# Patient Record
Sex: Female | Born: 1940 | Race: Black or African American | Hispanic: No | Marital: Married | Smoking: Never smoker
Health system: Southern US, Community
[De-identification: ages and names within clinical notes are randomized; demographics above are authoritative.]

## PROBLEM LIST (undated history)

## (undated) ENCOUNTER — Emergency Department (HOSPITAL_COMMUNITY): Admission: EM | Payer: Medicare Other | Source: Home / Self Care

## (undated) DIAGNOSIS — R52 Pain, unspecified: Secondary | ICD-10-CM

## (undated) DIAGNOSIS — Z87898 Personal history of other specified conditions: Secondary | ICD-10-CM

## (undated) DIAGNOSIS — K5790 Diverticulosis of intestine, part unspecified, without perforation or abscess without bleeding: Secondary | ICD-10-CM

## (undated) DIAGNOSIS — Z86010 Personal history of colonic polyps: Secondary | ICD-10-CM

## (undated) DIAGNOSIS — M1990 Unspecified osteoarthritis, unspecified site: Secondary | ICD-10-CM

## (undated) DIAGNOSIS — D126 Benign neoplasm of colon, unspecified: Secondary | ICD-10-CM

## (undated) DIAGNOSIS — E669 Obesity, unspecified: Secondary | ICD-10-CM

## (undated) DIAGNOSIS — K5792 Diverticulitis of intestine, part unspecified, without perforation or abscess without bleeding: Secondary | ICD-10-CM

## (undated) DIAGNOSIS — Z8719 Personal history of other diseases of the digestive system: Secondary | ICD-10-CM

## (undated) DIAGNOSIS — R202 Paresthesia of skin: Secondary | ICD-10-CM

## (undated) DIAGNOSIS — E119 Type 2 diabetes mellitus without complications: Secondary | ICD-10-CM

## (undated) DIAGNOSIS — M810 Age-related osteoporosis without current pathological fracture: Secondary | ICD-10-CM

## (undated) DIAGNOSIS — R200 Anesthesia of skin: Secondary | ICD-10-CM

## (undated) DIAGNOSIS — A498 Other bacterial infections of unspecified site: Secondary | ICD-10-CM

## (undated) DIAGNOSIS — R2990 Unspecified symptoms and signs involving the nervous system: Secondary | ICD-10-CM

## (undated) DIAGNOSIS — I10 Essential (primary) hypertension: Secondary | ICD-10-CM

## (undated) DIAGNOSIS — Z8619 Personal history of other infectious and parasitic diseases: Secondary | ICD-10-CM

## (undated) DIAGNOSIS — E785 Hyperlipidemia, unspecified: Secondary | ICD-10-CM

## (undated) DIAGNOSIS — J189 Pneumonia, unspecified organism: Secondary | ICD-10-CM

## (undated) DIAGNOSIS — G5600 Carpal tunnel syndrome, unspecified upper limb: Secondary | ICD-10-CM

## (undated) DIAGNOSIS — E559 Vitamin D deficiency, unspecified: Secondary | ICD-10-CM

## (undated) DIAGNOSIS — K219 Gastro-esophageal reflux disease without esophagitis: Secondary | ICD-10-CM

## (undated) DIAGNOSIS — I639 Cerebral infarction, unspecified: Secondary | ICD-10-CM

## (undated) DIAGNOSIS — T4145XA Adverse effect of unspecified anesthetic, initial encounter: Secondary | ICD-10-CM

## (undated) DIAGNOSIS — N390 Urinary tract infection, site not specified: Secondary | ICD-10-CM

## (undated) DIAGNOSIS — T8859XA Other complications of anesthesia, initial encounter: Secondary | ICD-10-CM

## (undated) HISTORY — PX: OOPHORECTOMY: SHX86

## (undated) HISTORY — DX: Obesity, unspecified: E66.9

## (undated) HISTORY — PX: OTHER SURGICAL HISTORY: SHX169

## (undated) HISTORY — DX: Carpal tunnel syndrome, unspecified upper limb: G56.00

## (undated) HISTORY — DX: Essential (primary) hypertension: I10

## (undated) HISTORY — DX: Benign neoplasm of colon, unspecified: D12.6

## (undated) HISTORY — DX: Personal history of colonic polyps: Z86.010

## (undated) HISTORY — DX: Diverticulosis of intestine, part unspecified, without perforation or abscess without bleeding: K57.90

## (undated) HISTORY — PX: APPENDECTOMY: SHX54

## (undated) HISTORY — DX: Diverticulitis of intestine, part unspecified, without perforation or abscess without bleeding: K57.92

## (undated) HISTORY — DX: Hyperlipidemia, unspecified: E78.5

## (undated) HISTORY — PX: COLON SURGERY: SHX602

## (undated) HISTORY — DX: Unspecified symptoms and signs involving the nervous system: R29.90

## (undated) HISTORY — PX: COLONOSCOPY: SHX174

## (undated) HISTORY — DX: Vitamin D deficiency, unspecified: E55.9

## (undated) HISTORY — PX: BREAST BIOPSY: SHX20

## (undated) HISTORY — PX: BREAST SURGERY: SHX581

## (undated) HISTORY — DX: Other bacterial infections of unspecified site: A49.8

## (undated) HISTORY — DX: Unspecified osteoarthritis, unspecified site: M19.90

## (undated) HISTORY — PX: POLYPECTOMY: SHX149

## (undated) HISTORY — PX: JOINT REPLACEMENT: SHX530

## (undated) HISTORY — PX: ABDOMINAL HYSTERECTOMY: SHX81

---

## 1999-03-19 ENCOUNTER — Emergency Department (HOSPITAL_COMMUNITY): Admission: EM | Admit: 1999-03-19 | Discharge: 1999-03-19 | Payer: Self-pay | Admitting: Emergency Medicine

## 1999-03-19 ENCOUNTER — Encounter: Payer: Self-pay | Admitting: Emergency Medicine

## 2000-01-22 ENCOUNTER — Encounter: Payer: Self-pay | Admitting: Emergency Medicine

## 2000-01-22 ENCOUNTER — Inpatient Hospital Stay (HOSPITAL_COMMUNITY): Admission: EM | Admit: 2000-01-22 | Discharge: 2000-01-25 | Payer: Self-pay | Admitting: Emergency Medicine

## 2000-01-24 ENCOUNTER — Encounter: Payer: Self-pay | Admitting: Internal Medicine

## 2000-07-11 ENCOUNTER — Other Ambulatory Visit: Admission: RE | Admit: 2000-07-11 | Discharge: 2000-07-11 | Payer: Self-pay | Admitting: Internal Medicine

## 2001-11-28 ENCOUNTER — Encounter: Payer: Self-pay | Admitting: Internal Medicine

## 2001-11-28 ENCOUNTER — Ambulatory Visit (HOSPITAL_COMMUNITY): Admission: RE | Admit: 2001-11-28 | Discharge: 2001-11-28 | Payer: Self-pay | Admitting: Internal Medicine

## 2003-04-27 ENCOUNTER — Emergency Department (HOSPITAL_COMMUNITY): Admission: EM | Admit: 2003-04-27 | Discharge: 2003-04-28 | Payer: Self-pay | Admitting: Emergency Medicine

## 2003-10-31 DIAGNOSIS — J189 Pneumonia, unspecified organism: Secondary | ICD-10-CM

## 2003-10-31 HISTORY — DX: Pneumonia, unspecified organism: J18.9

## 2004-06-08 ENCOUNTER — Encounter: Payer: Self-pay | Admitting: Gastroenterology

## 2004-12-06 ENCOUNTER — Emergency Department (HOSPITAL_COMMUNITY): Admission: EM | Admit: 2004-12-06 | Discharge: 2004-12-06 | Payer: Self-pay | Admitting: Family Medicine

## 2004-12-07 ENCOUNTER — Ambulatory Visit: Payer: Self-pay | Admitting: Internal Medicine

## 2004-12-14 ENCOUNTER — Other Ambulatory Visit: Admission: RE | Admit: 2004-12-14 | Discharge: 2004-12-14 | Payer: Self-pay | Admitting: Obstetrics and Gynecology

## 2005-01-02 ENCOUNTER — Ambulatory Visit (HOSPITAL_COMMUNITY): Admission: RE | Admit: 2005-01-02 | Discharge: 2005-01-02 | Payer: Self-pay | Admitting: Obstetrics and Gynecology

## 2005-01-02 IMAGING — CT CT ABDOMEN W/ CM
1 of 4 series · 14 of 32 positions shown, 19 images · IV contrast (omnipaque)
Comparison: None.

CLINICAL DATA: Pelvic mass.  Right hip pain.  Weight loss.  Constipation.  Hematuria.
ABDOMEN AND PELVIS CT SCAN WITH CONTRAST ? [DATE]:
TECHNIQUE: Contiguous axial CT images were taken through the abdomen and pelvis after the administration of 150 cc of Omnipaque 300 contrast material

[Series 3: abd_pel_xxl 5.0 b10s st · axial · 0.93mm/px · z∈[+892,+1338]mm · 14 of 101 slices shown, 19 images]
[im 6/101  soft-tissue]
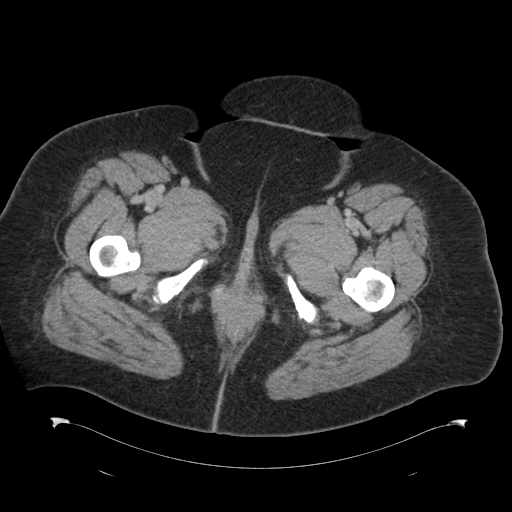
[im 6/101  bone]
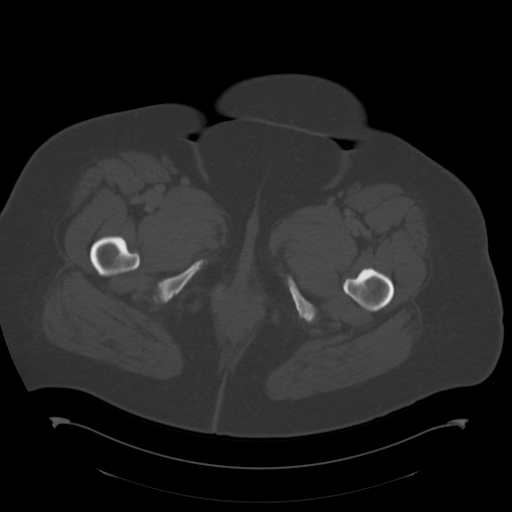
[im 12/101  soft-tissue]
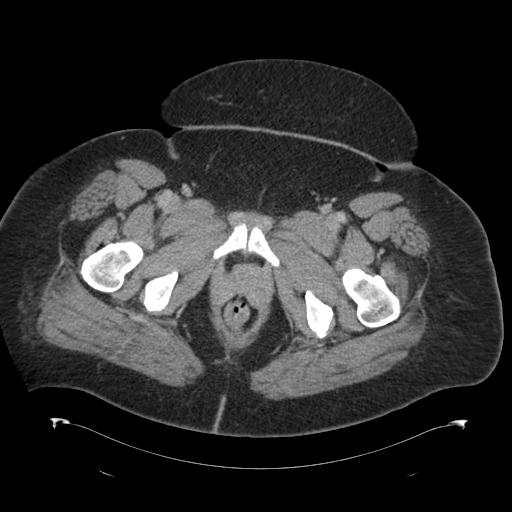
[im 24/101  soft-tissue]
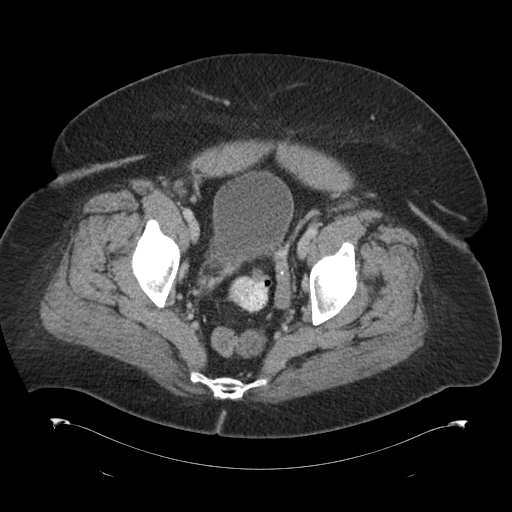
[im 30/101  soft-tissue]
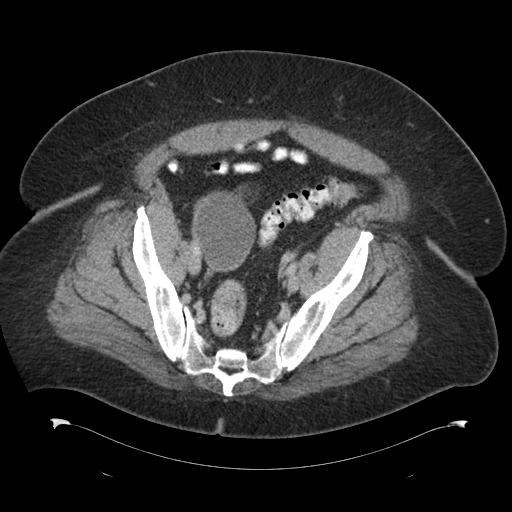
[im 36/101  soft-tissue]
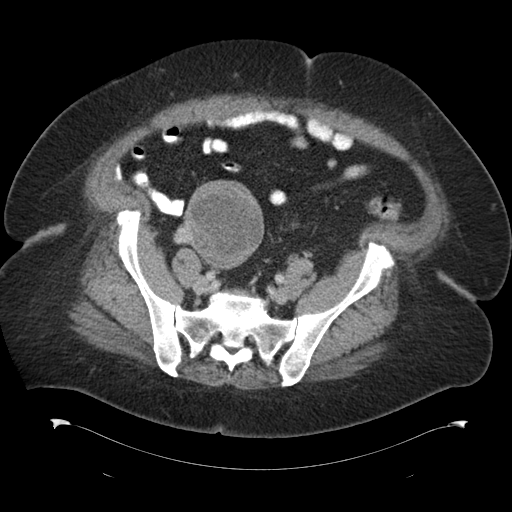
[im 42/101  soft-tissue]
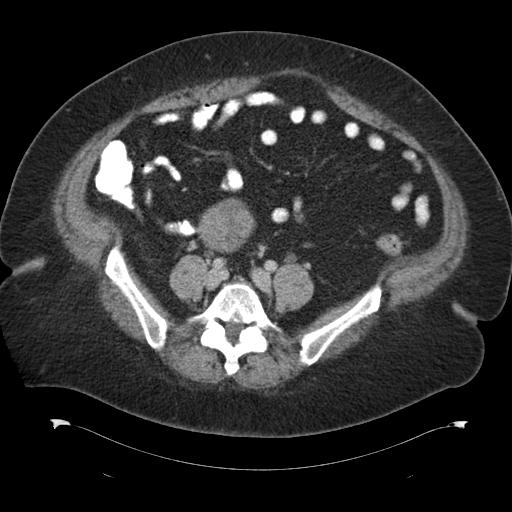
[im 53/101  soft-tissue]
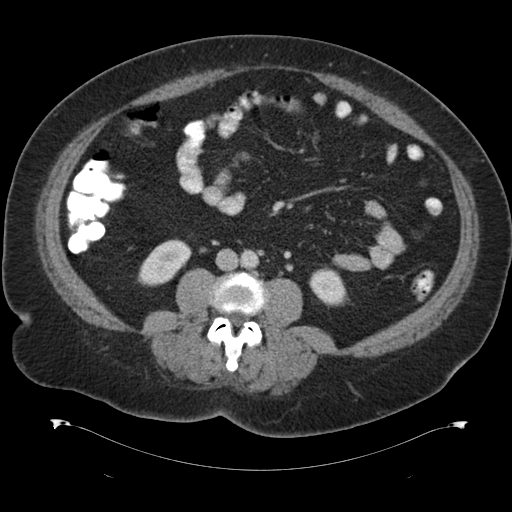
[im 59/101  soft-tissue]
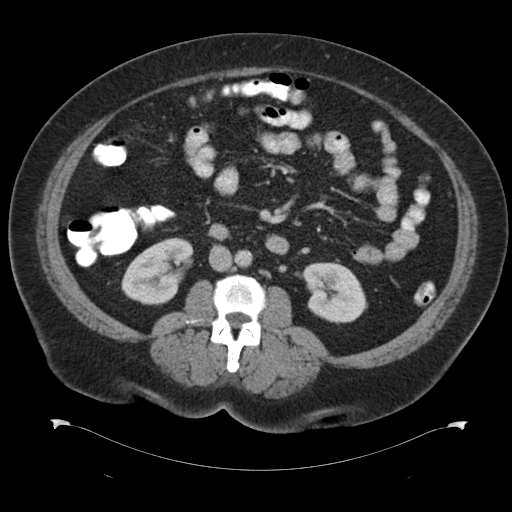
[im 65/101  soft-tissue]
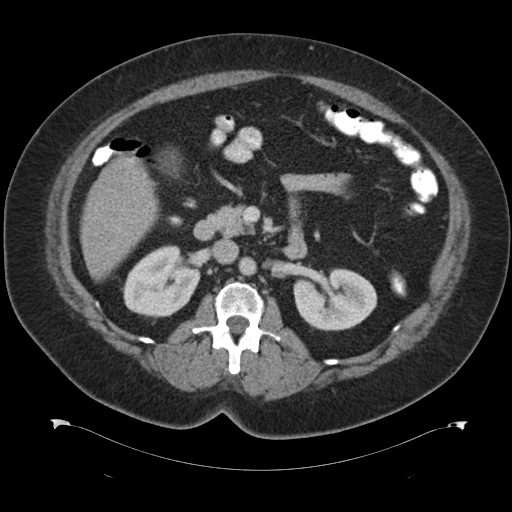
[im 65/101  bone]
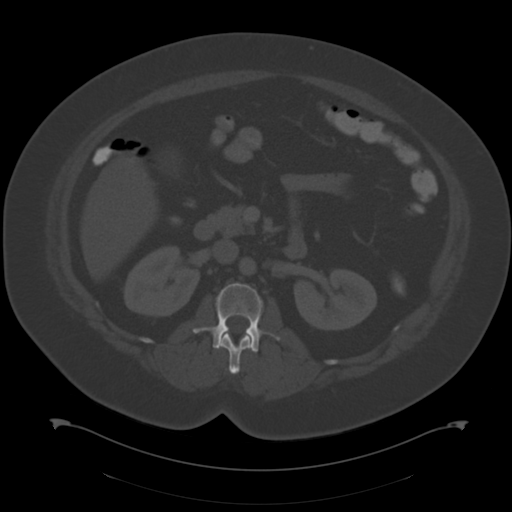
[im 71/101  soft-tissue]
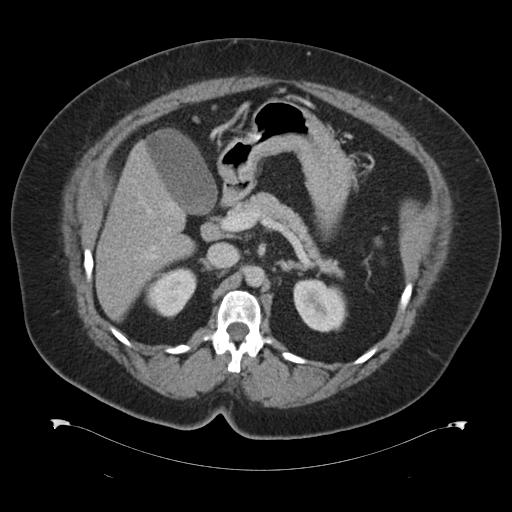
[im 77/101  soft-tissue]
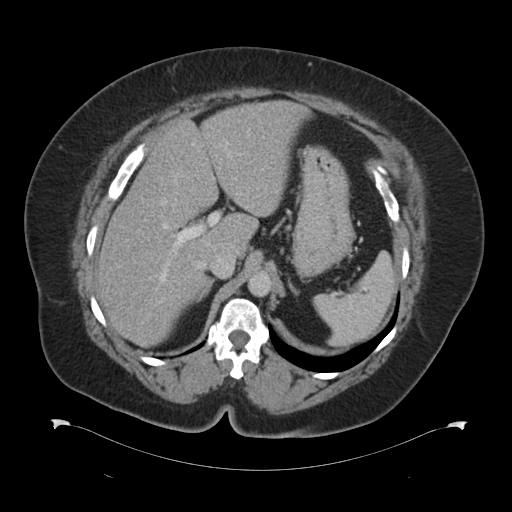
[im 77/101  lung]
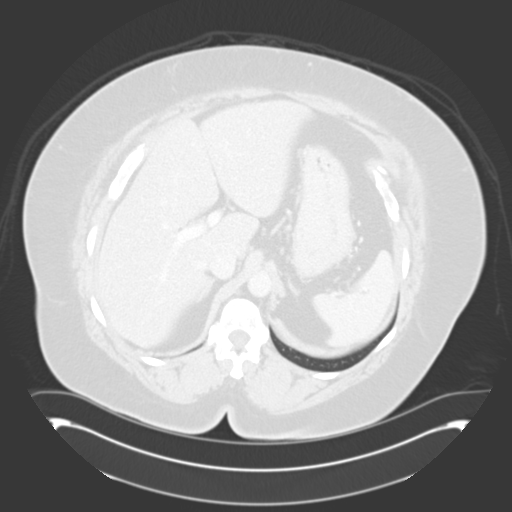
[im 83/101  lung]
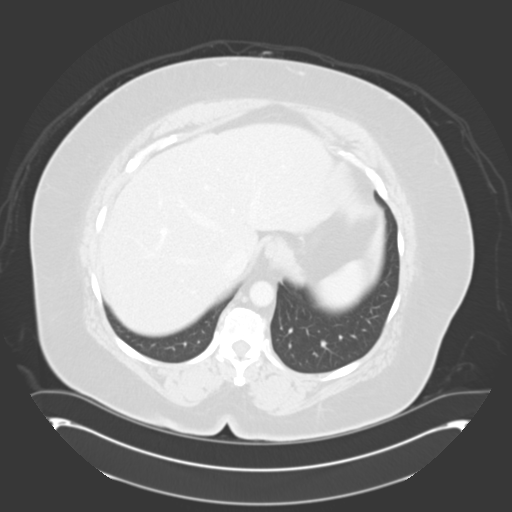
[im 89/101  soft-tissue]
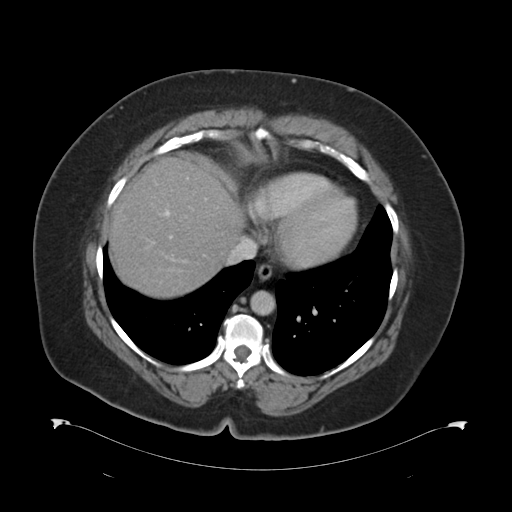
[im 89/101  lung]
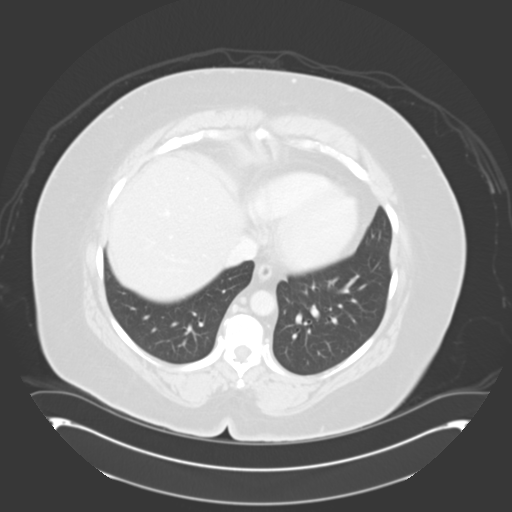
[im 95/101  soft-tissue]
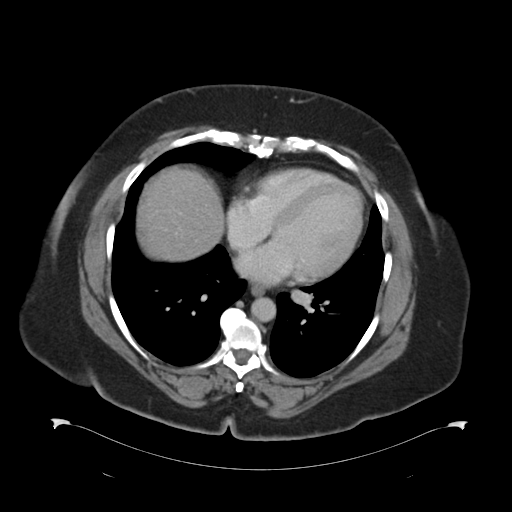
[im 95/101  lung]
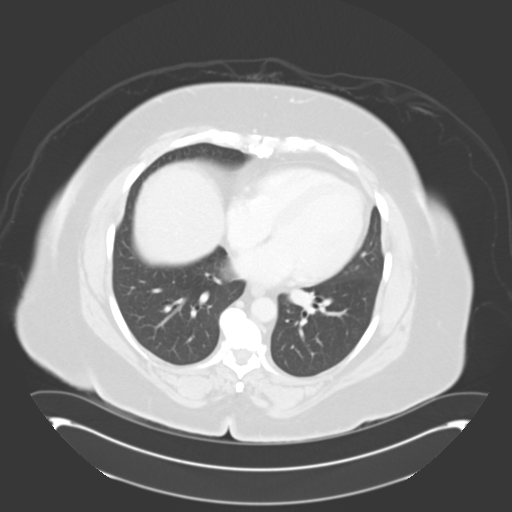

[14 of 32 positions shown; findings below may reference images not displayed]

FINDINGS: ABDOMEN:
The lung bases are clear.  No pleural or pericardial effusion.
The liver, spleen, pancreas, kidneys, and adrenal glands all appear normal.  A 1.6 x 1.9 cm porta hepatis lymph node is identified on image #29 of series 3.  A second porta hepatis node measuring 2.1 x 2.5 cm is identified on image #31.  There appears to be some sludge within the dilated gallbladder.  No abnormal abdominal fluid collections are identified.  The stomach and bowel of the abdomen appear normal.  No focal bony abnormality.
IMPRESSION: 1.  Porta hepatis lymphadenopathy as above.  No other findings to suggest metastatic disease identified.
2.  Biliary sludge.
PELVIS:
There is a heterogenous mass in the right adnexa measuring approximately 8.5 cm AP x 7.3 cm transverse x 9 cm craniocaudal.  No pelvic ascites is identified.  The patient is status post hysterectomy.  The left ovary appears normal.  Lymph nodes are identified in the pelvic sidewalls on the right measuring 2.5 x 0.7 cm on image #79 and on the left measuring 1.8 x 0.8 cm on image #78.  Also seen is a left external iliac node measuring 0.8 x 2.1 cm on image #82.  No pelvic ascites.  Scattered diverticula are seen in the colon without diverticulitis.  The appendix appears normal.
IMPRESSION: 1.  Right adnexal mass with an appearance highly worrisome for neoplasm.
2.  Few small pelvic lymph nodes bilaterally as detailed above.
3.  Diverticulosis without diverticulitis.

## 2005-01-04 ENCOUNTER — Ambulatory Visit: Payer: Self-pay | Admitting: Internal Medicine

## 2005-02-06 ENCOUNTER — Ambulatory Visit: Payer: Self-pay | Admitting: Internal Medicine

## 2005-02-20 ENCOUNTER — Ambulatory Visit: Payer: Self-pay | Admitting: Internal Medicine

## 2005-03-06 ENCOUNTER — Ambulatory Visit: Payer: Self-pay | Admitting: Internal Medicine

## 2005-04-08 ENCOUNTER — Emergency Department (HOSPITAL_COMMUNITY): Admission: EM | Admit: 2005-04-08 | Discharge: 2005-04-08 | Payer: Self-pay | Admitting: *Deleted

## 2005-04-08 IMAGING — CR DG CHEST 2V
1 series · 1 of 1 positions shown · non-contrast
Comparison: None.

CLINICAL DATA: Chest pain and diarrhea.  
 CHEST - 2 VIEW - [DATE]:

[w chest pa]
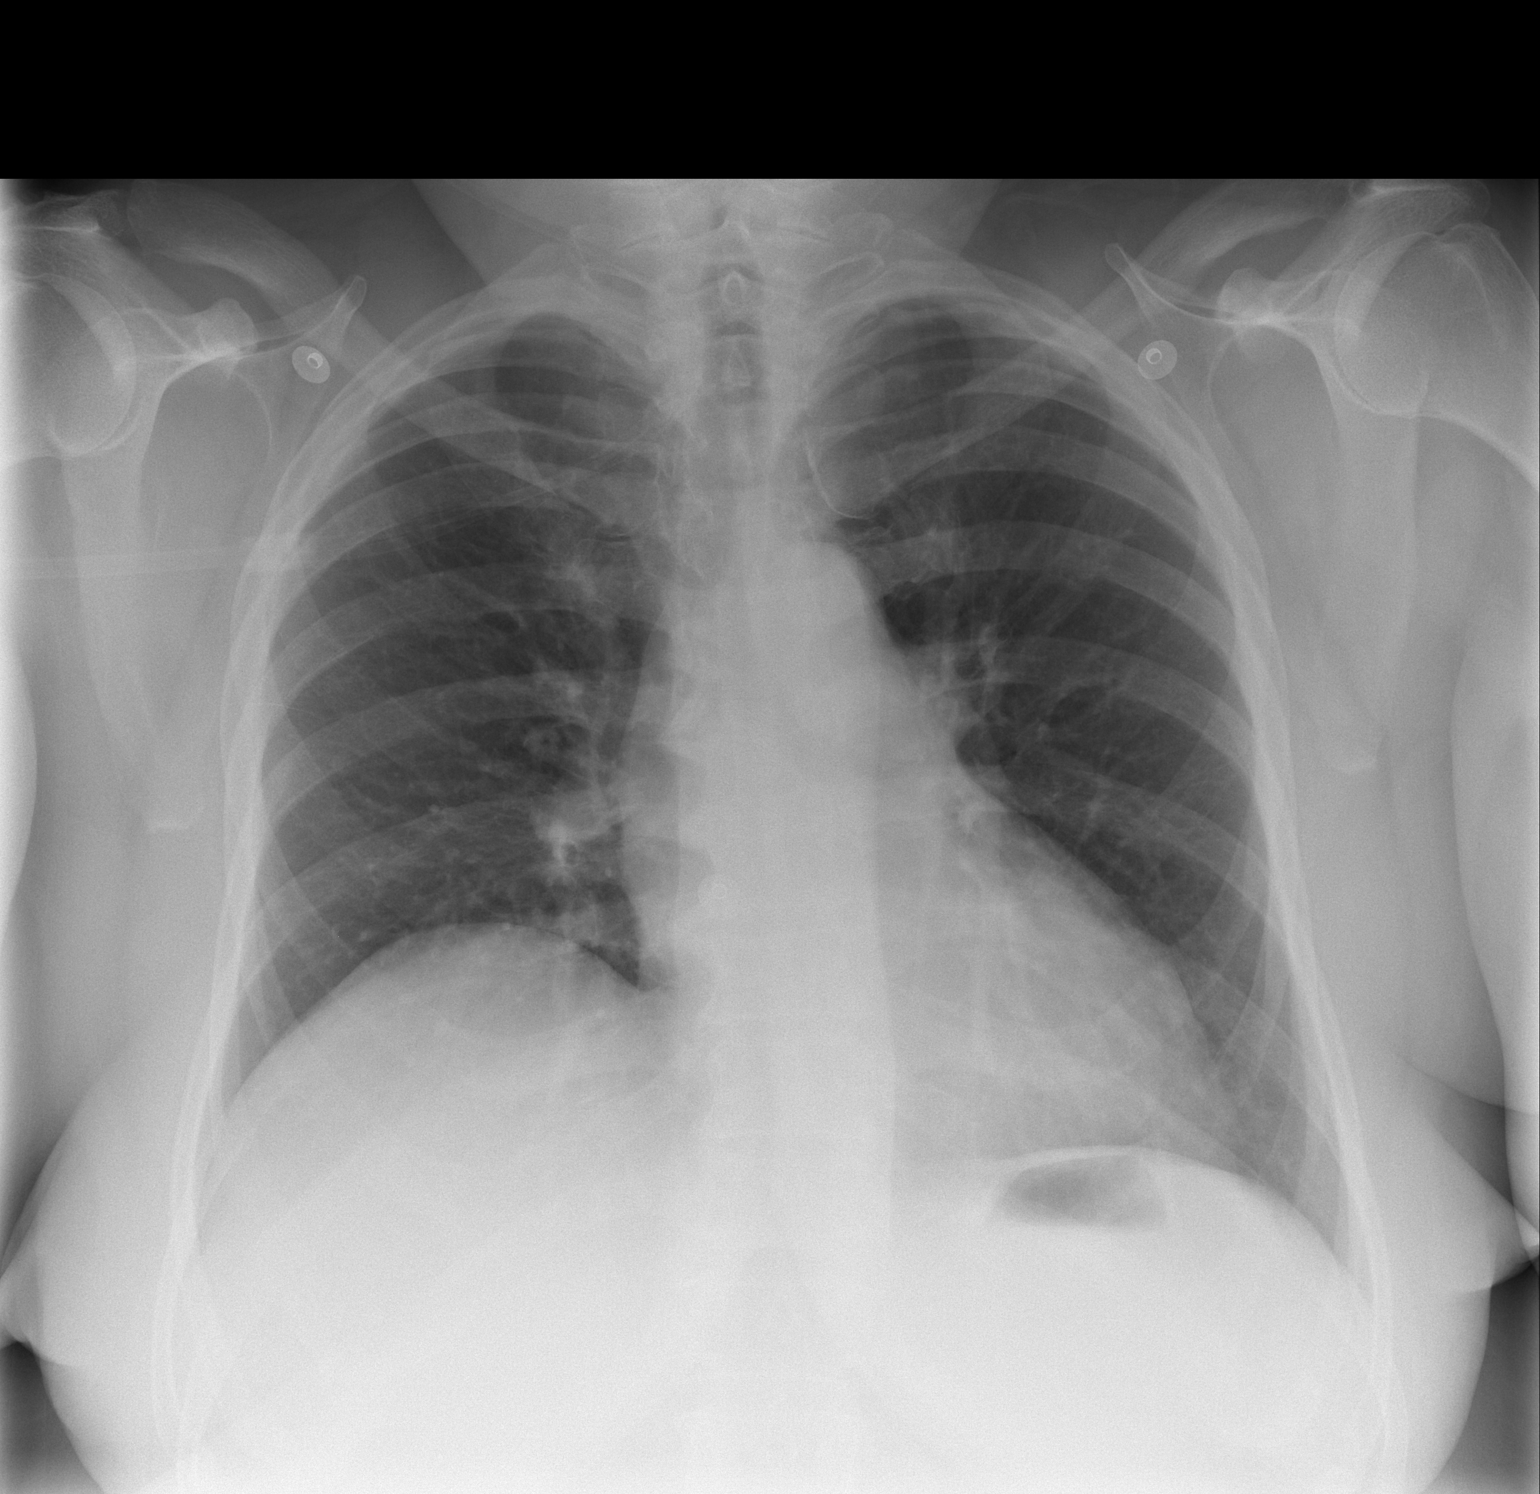

[1 of 1 positions shown; findings below may reference images not displayed]

Two view chest shows no focal consolidation, edema, or effusion.  There is asymmetric elevation of the right hemidiaphragm.  The cardiopericardial silhouette is at the upper limits of normal for size.  The bony structures of the imaged thorax are intact.
IMPRESSION: No acute cardiopulmonary process.

## 2005-04-08 IMAGING — CT CT ANGIO CHEST
2 of 3 series · 8 of 33 positions shown · IV contrast (APPLIED)
Comparison: none

CLINICAL DATA: 63 year-old female with chest pain; shortness of breath 

CT ANGIOGRAPHY OF CHEST:
TECHNIQUE: Multidetector CT imaging of the chest was performed during bolus injection of intravenous contrast.  Multiplanar CT angiographic image reconstructions were generated to evaluate the vascular anatomy.
Contrast:  80cc Omnipaque 300

[Series 4: pe 3.0 b40f st · axial · 0.65mm/px · z∈[-214,-205]mm · 2 of 101 slices shown]
[im 48/101  lung]
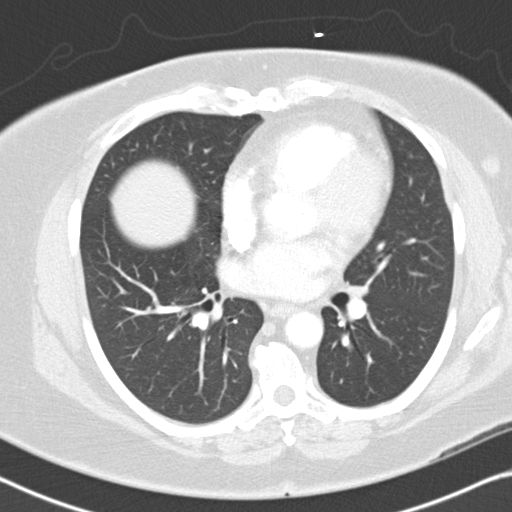
[im 51/101  lung]
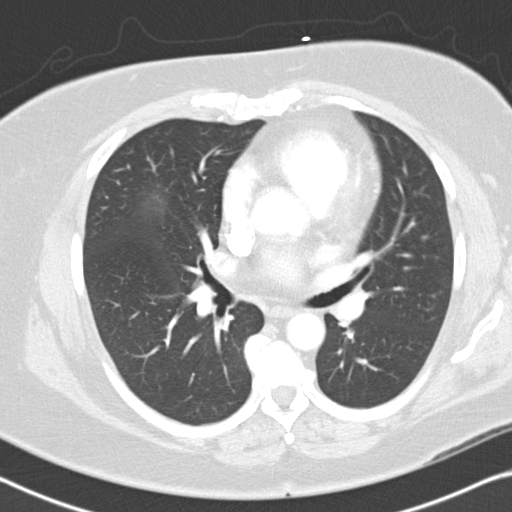

[Series 5: pe 1.0 b20f st · axial · 0.65mm/px · z∈[-309,-102]mm · 6 of 311 slices shown]
[im 52/311  lung]
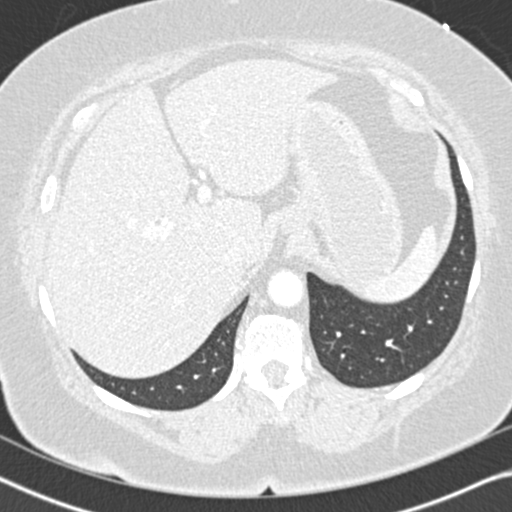
[im 104/311  mediastinal]
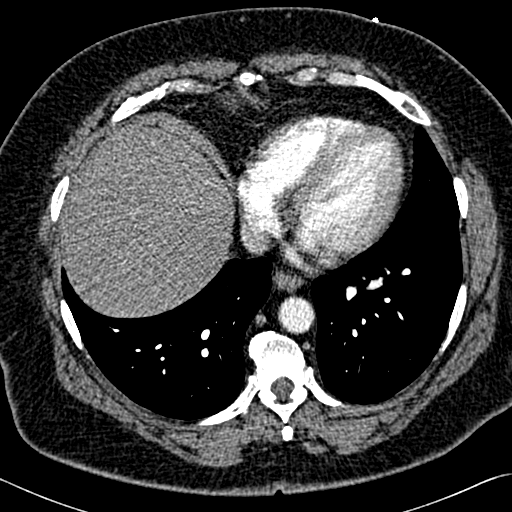
[im 146/311  lung]
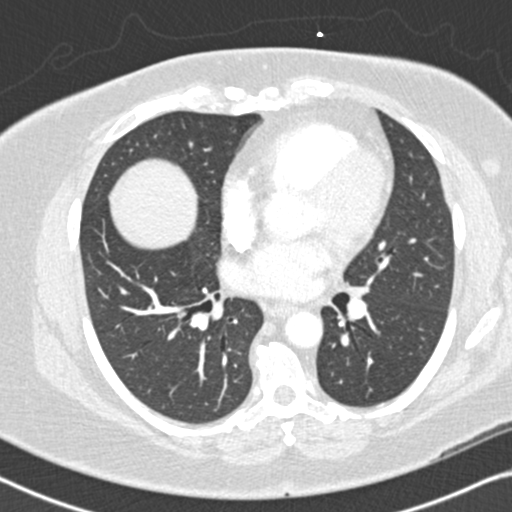
[im 156/311  mediastinal]
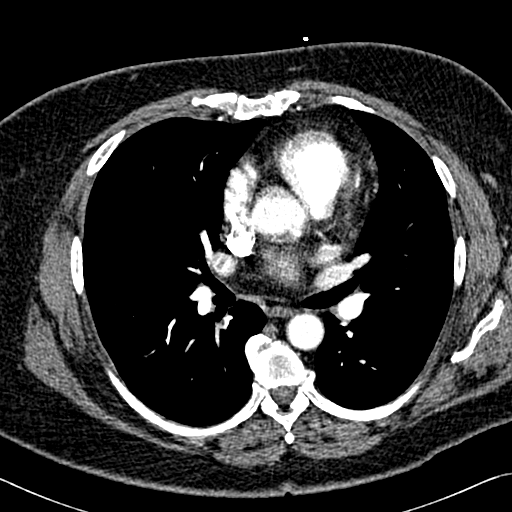
[im 207/311  lung]
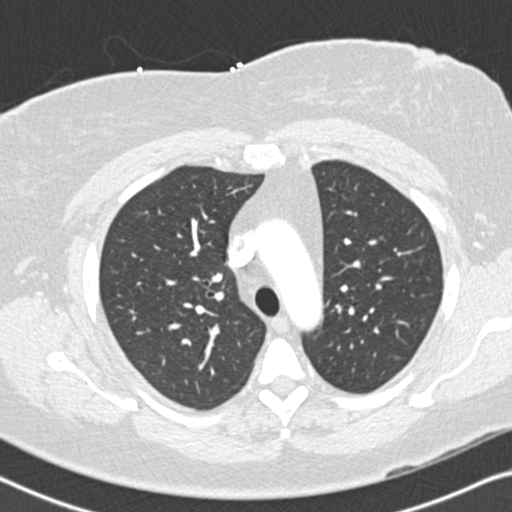
[im 259/311  mediastinal]
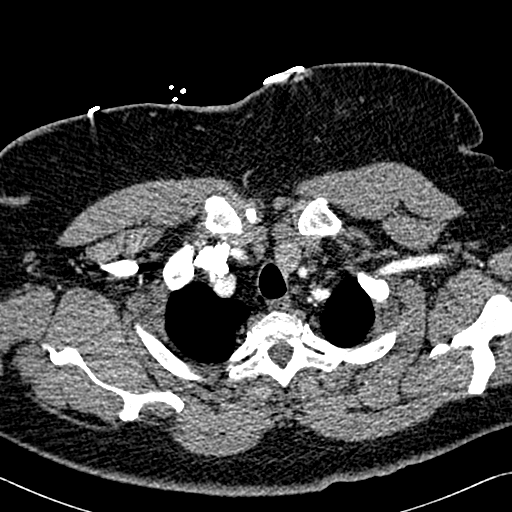

[8 of 33 positions shown; findings below may reference images not displayed]

FINDINGS: The thoracic aorta is intact.  There is bovine arch anatomy which is a normal variant where the brachiocephalic and left common carotid arteries have a common origin.  No evidence of thoracic aortic aneurysm, dissection, or acute mediastinal hemorrhage.  Normal heart size.  No pericardial or pleural fluid.  No adenopathy in the chest.  Small scattered nonenlarged mediastinal, axillary, and left axillary lymph nodes are noted.
The opacified pulmonary arterial vasculature demonstrates no evidence of a pulmonary outflow tract or central saddle embolus.  The lower lobe pulmonary arterial vasculature is well enhanced as is the right middle lobe.  These regions demonstrate no evidence of thromboembolic disease or pulmonary emboli.  Unfortunately, the upper lobe pulmonary vasculature is not well opacified and small emboli to the upper lobes are not excluded by this exam.  No secondary signs are demonstrated.  Specifically, no focal airspace disease, or effusion is present.  Lung windows demonstrate clear lungs.  No consolidation, pneumonia, bronchiectasis, bronchial wall thickening, emphysema, or interstitial change.  
Imaging of the upper abdomen demonstrates stable prominent porta hepatis lymph nodes.
IMPRESSION: 1.  Limited exam in evaluating the upper lobe pulmonary vasculature therefore small upper lobe pulmonary emboli are not excluded.
2.  No central or saddle embolus
3.  No lower lobe pulmonary embolus.
4.  No acute airspace process.

## 2005-04-10 ENCOUNTER — Ambulatory Visit: Payer: Self-pay | Admitting: Family Medicine

## 2005-04-17 ENCOUNTER — Ambulatory Visit: Payer: Self-pay | Admitting: Internal Medicine

## 2005-05-08 ENCOUNTER — Ambulatory Visit: Payer: Self-pay | Admitting: Internal Medicine

## 2005-08-08 ENCOUNTER — Ambulatory Visit: Payer: Self-pay | Admitting: Internal Medicine

## 2005-11-03 ENCOUNTER — Ambulatory Visit: Payer: Self-pay | Admitting: Internal Medicine

## 2005-11-09 ENCOUNTER — Ambulatory Visit: Payer: Self-pay | Admitting: Gastroenterology

## 2006-01-18 ENCOUNTER — Other Ambulatory Visit: Admission: RE | Admit: 2006-01-18 | Discharge: 2006-01-18 | Payer: Self-pay | Admitting: Obstetrics and Gynecology

## 2006-04-02 ENCOUNTER — Ambulatory Visit: Payer: Self-pay | Admitting: Internal Medicine

## 2006-05-24 ENCOUNTER — Ambulatory Visit: Payer: Self-pay | Admitting: Internal Medicine

## 2006-06-25 IMAGING — CR DG SHOULDER 2+V*R*
4 series · 4 of 4 positions shown · non-contrast
Comparison: None.

CLINICAL DATA: Fall.  Shoulder pain.
 RIGHT SHOULDER ? 4 VIEW:

[w shoulder ap internal right *]
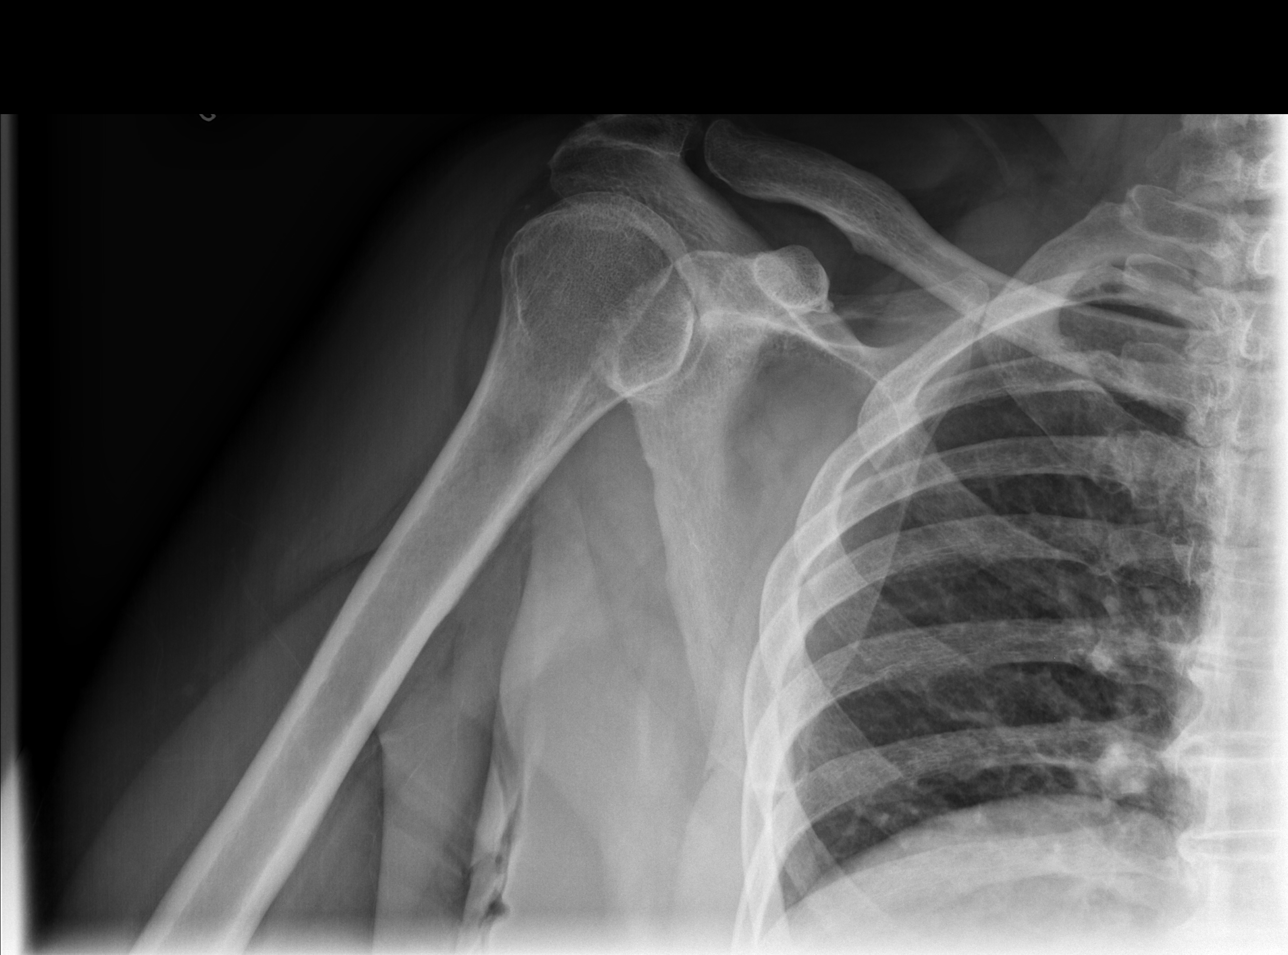

[w shoulder ap external right]
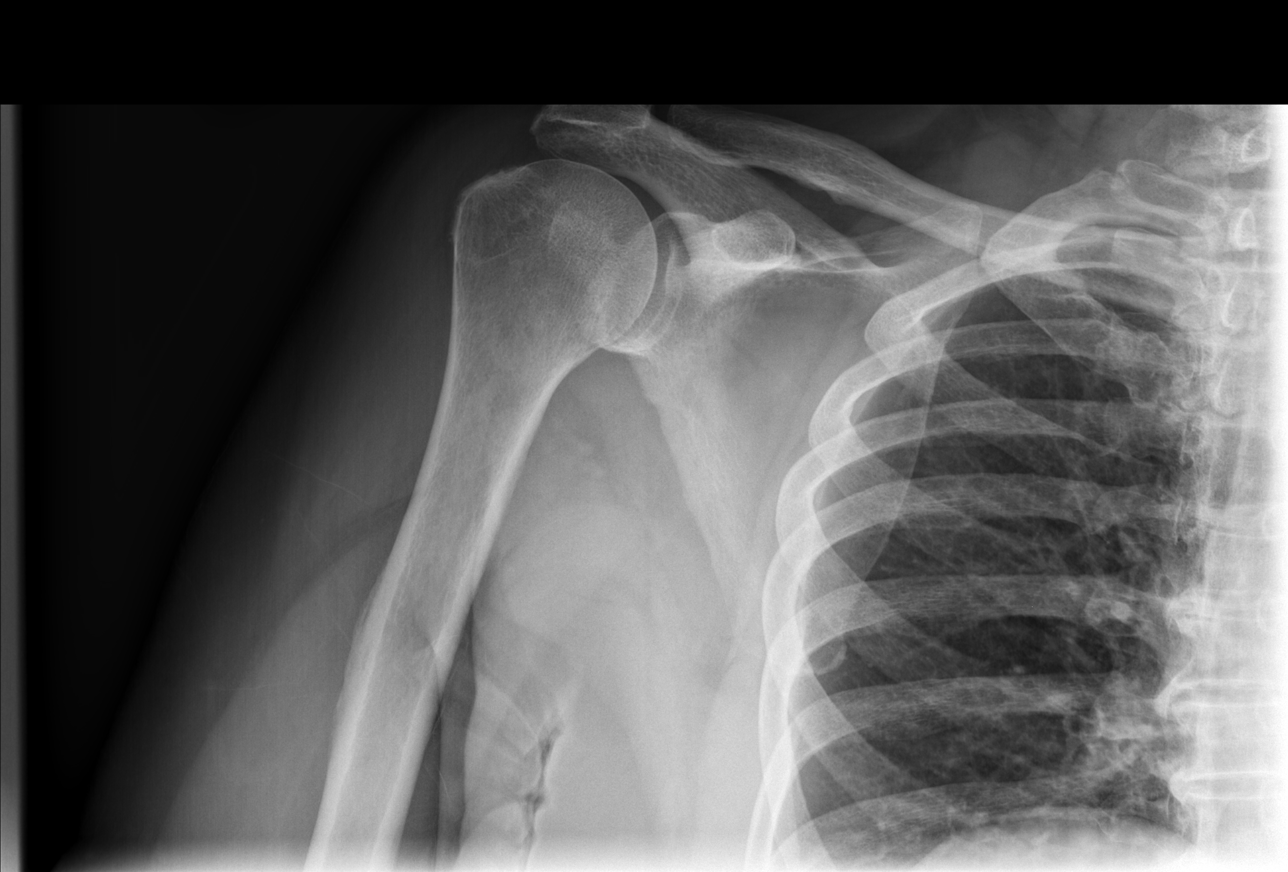

[w shoulder y view right *]
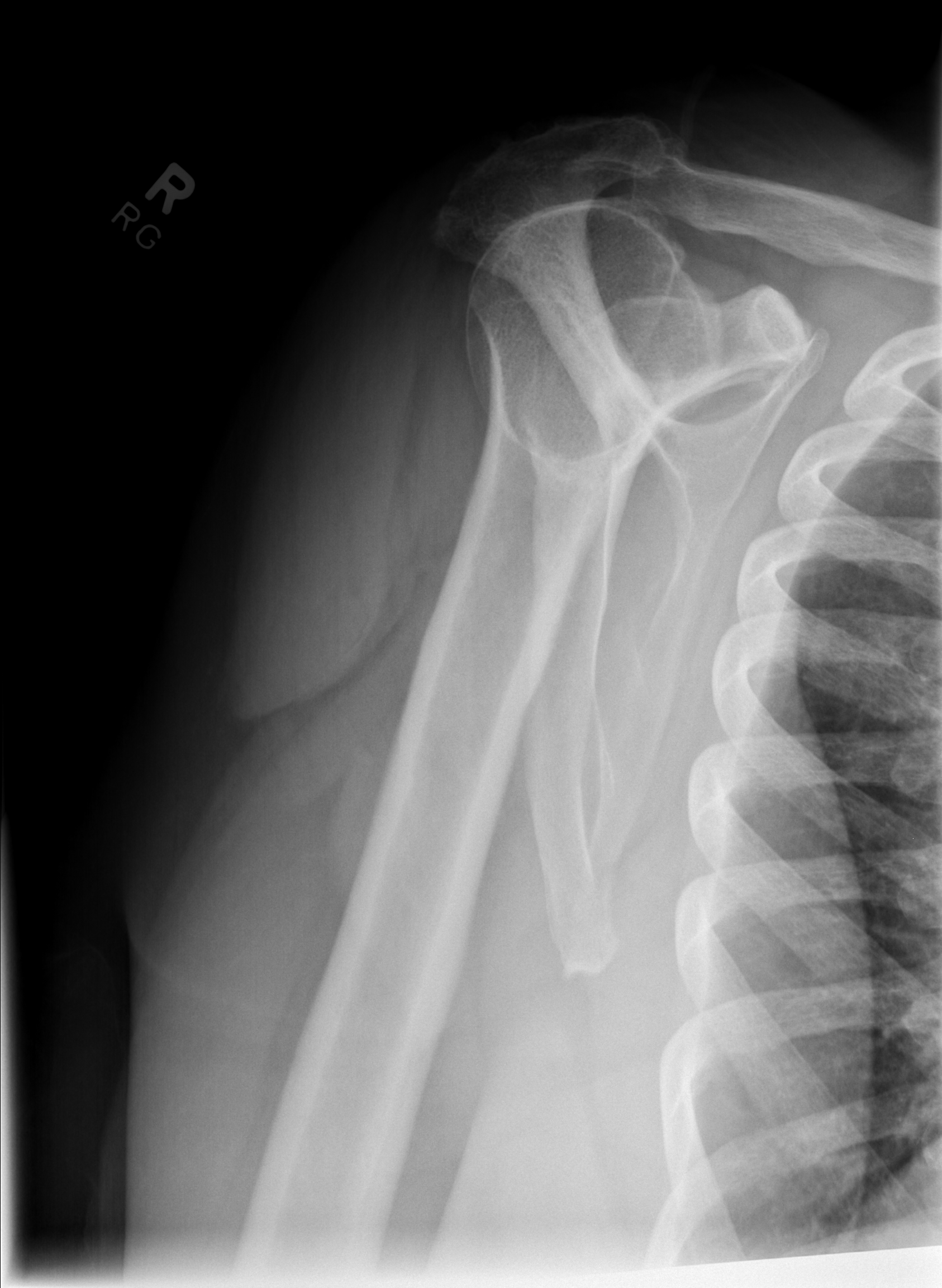

[w shoulder axillary right *]
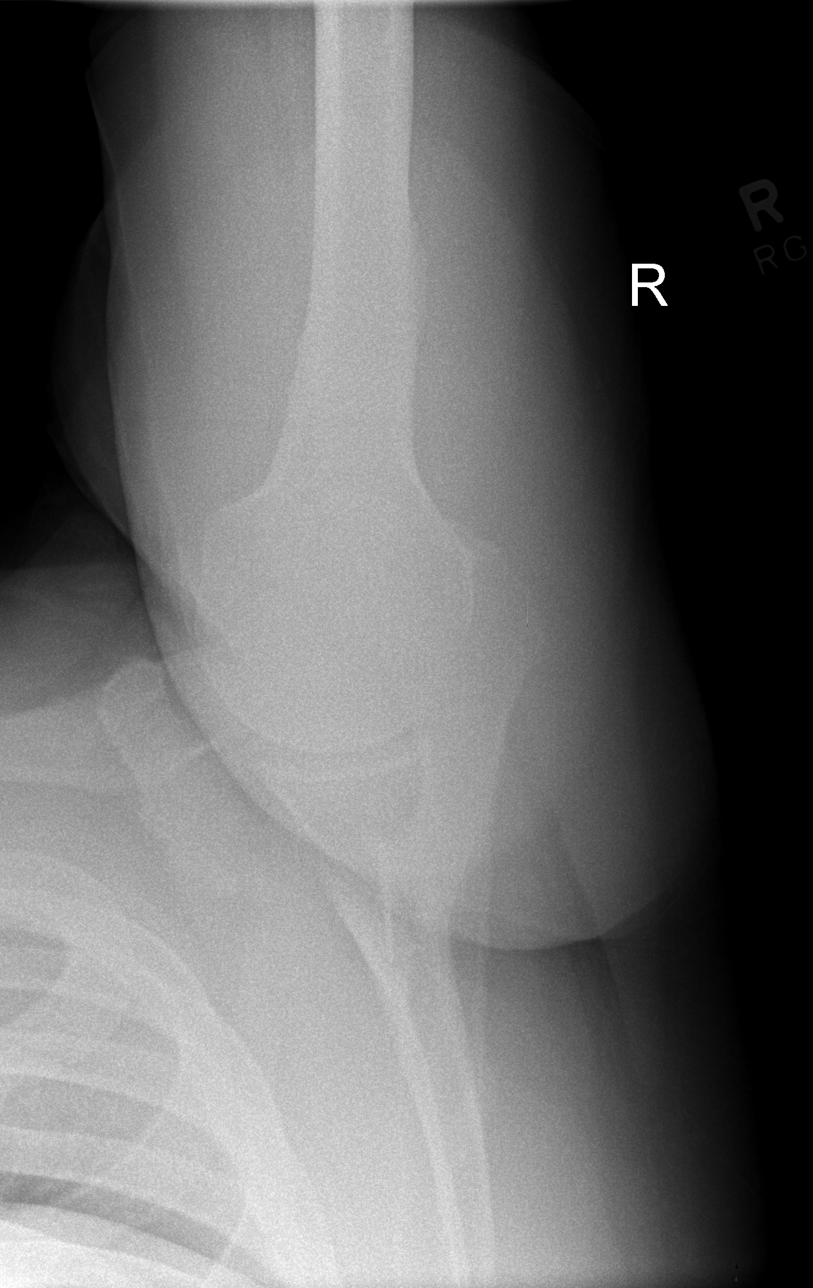

[4 of 4 positions shown; findings below may reference images not displayed]

FINDINGS: Internal and external rotation views appear within normal limits.  Mild AC joint osteoarthritis is noted.  Rotator cuff calcific tendinitis.  No fracture is identified.  The shoulder appears located.  Mild to moderate AC joint osteoarthritis.
IMPRESSION: No acute abnormality of the right shoulder.

## 2006-07-25 ENCOUNTER — Ambulatory Visit: Payer: Self-pay | Admitting: Internal Medicine

## 2006-10-30 HISTORY — PX: COLON RESECTION: SHX5231

## 2007-01-25 ENCOUNTER — Ambulatory Visit: Payer: Self-pay | Admitting: Internal Medicine

## 2007-04-02 ENCOUNTER — Ambulatory Visit: Payer: Self-pay | Admitting: Internal Medicine

## 2007-06-03 DIAGNOSIS — I152 Hypertension secondary to endocrine disorders: Secondary | ICD-10-CM | POA: Insufficient documentation

## 2007-06-03 DIAGNOSIS — E785 Hyperlipidemia, unspecified: Secondary | ICD-10-CM | POA: Insufficient documentation

## 2007-06-03 DIAGNOSIS — I10 Essential (primary) hypertension: Secondary | ICD-10-CM | POA: Insufficient documentation

## 2007-06-05 ENCOUNTER — Ambulatory Visit: Payer: Self-pay | Admitting: Internal Medicine

## 2007-06-05 LAB — CONVERTED CEMR LAB
ALT: 27 units/L (ref 0–35)
AST: 28 units/L (ref 0–37)
Albumin: 3.6 g/dL (ref 3.5–5.2)
Alkaline Phosphatase: 48 units/L (ref 39–117)
BUN: 16 mg/dL (ref 6–23)
Bilirubin, Direct: 0.1 mg/dL (ref 0.0–0.3)
CO2: 28 meq/L (ref 19–32)
Calcium: 9.4 mg/dL (ref 8.4–10.5)
Chloride: 106 meq/L (ref 96–112)
Cholesterol: 279 mg/dL (ref 0–200)
Creatinine, Ser: 1 mg/dL (ref 0.4–1.2)
Direct LDL: 200.6 mg/dL
GFR calc Af Amer: 72 mL/min
GFR calc non Af Amer: 59 mL/min
Glucose, Bld: 98 mg/dL (ref 70–99)
HDL: 39.4 mg/dL (ref >39.0)
Potassium: 4.4 meq/L (ref 3.5–5.1)
Sodium: 140 meq/L (ref 135–145)
Total Bilirubin: 1.1 mg/dL (ref 0.3–1.2)
Total CHOL/HDL Ratio: 7.1
Total Protein: 7 g/dL (ref 6.0–8.3)
Triglycerides: 179 mg/dL — ABNORMAL HIGH (ref 0–149)
VLDL: 36 mg/dL (ref 0–40)

## 2007-06-12 ENCOUNTER — Ambulatory Visit: Payer: Self-pay | Admitting: Internal Medicine

## 2007-06-20 ENCOUNTER — Encounter: Payer: Self-pay | Admitting: Internal Medicine

## 2007-08-03 ENCOUNTER — Emergency Department (HOSPITAL_COMMUNITY): Admission: EM | Admit: 2007-08-03 | Discharge: 2007-08-03 | Payer: Self-pay | Admitting: Emergency Medicine

## 2007-08-03 IMAGING — CR DG RIBS W/ CHEST 3+V*R*
4 series · 4 of 4 positions shown · non-contrast
Comparison: PE study dated [DATE].

CLINICAL DATA: Rib pain, fall.  
 RIGHT RIBS W/CHEST ? 4 VIEW ? [DATE]:

[w chest pa *]
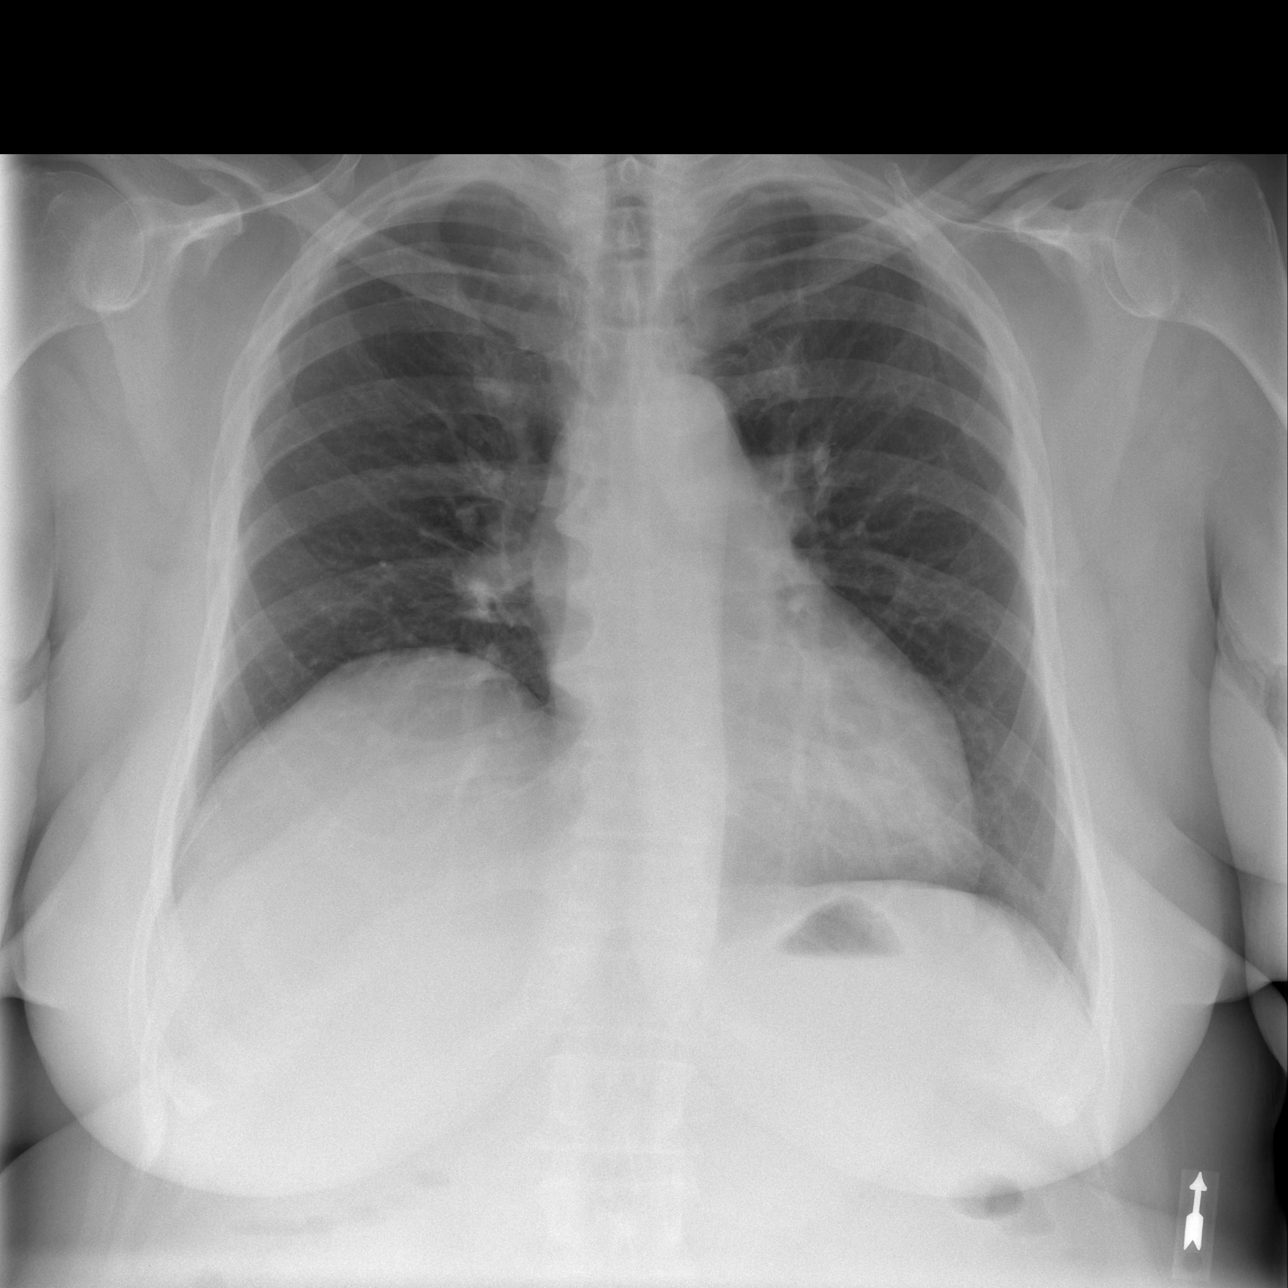

[w ribs ap/pa upper right *]
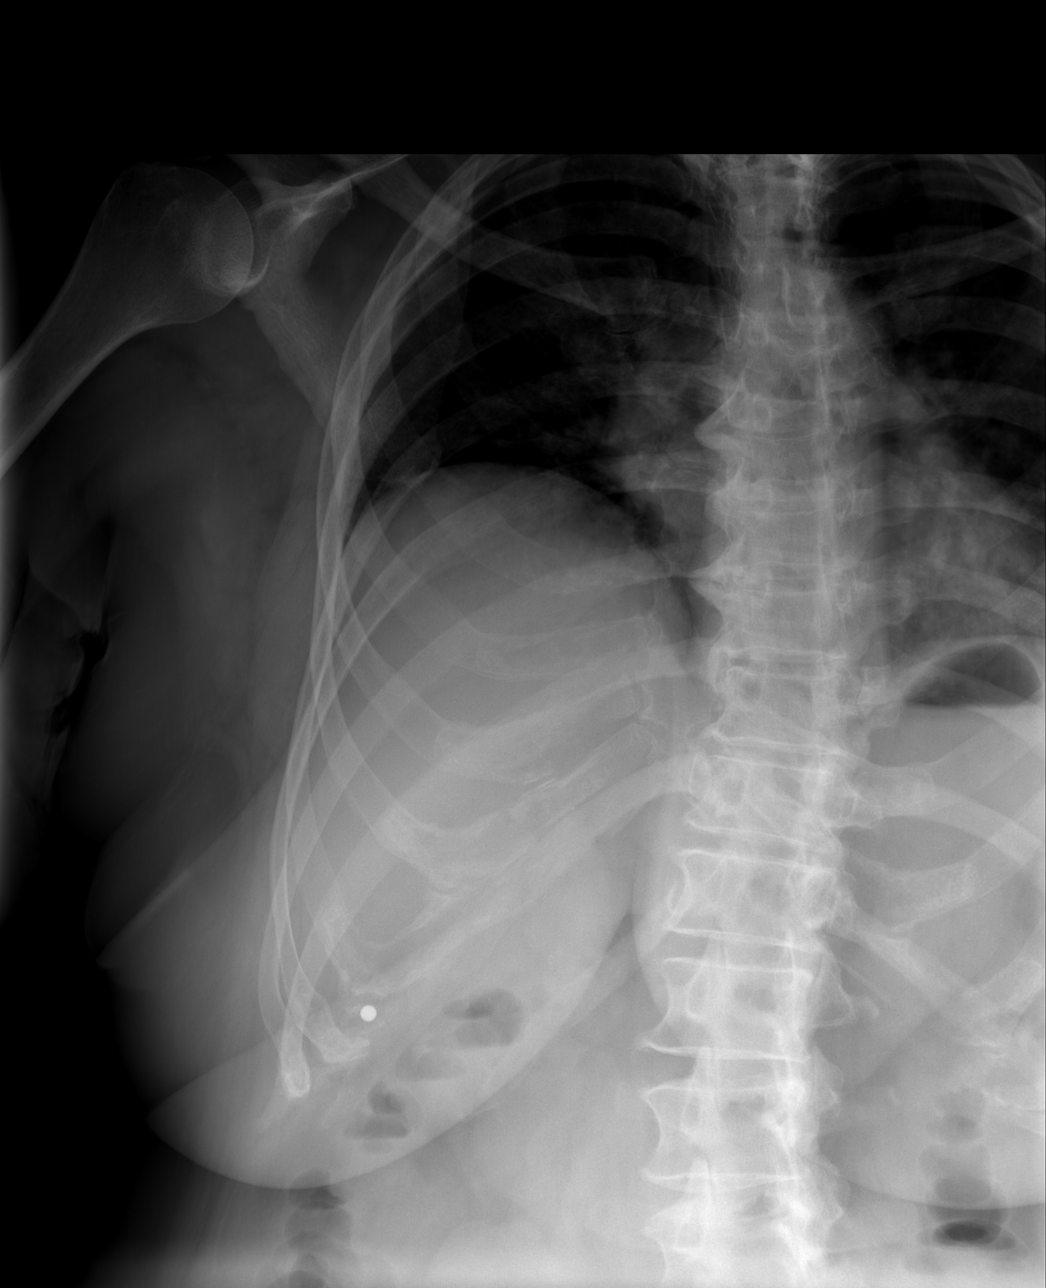

[w ribs ap/pa lower right *]
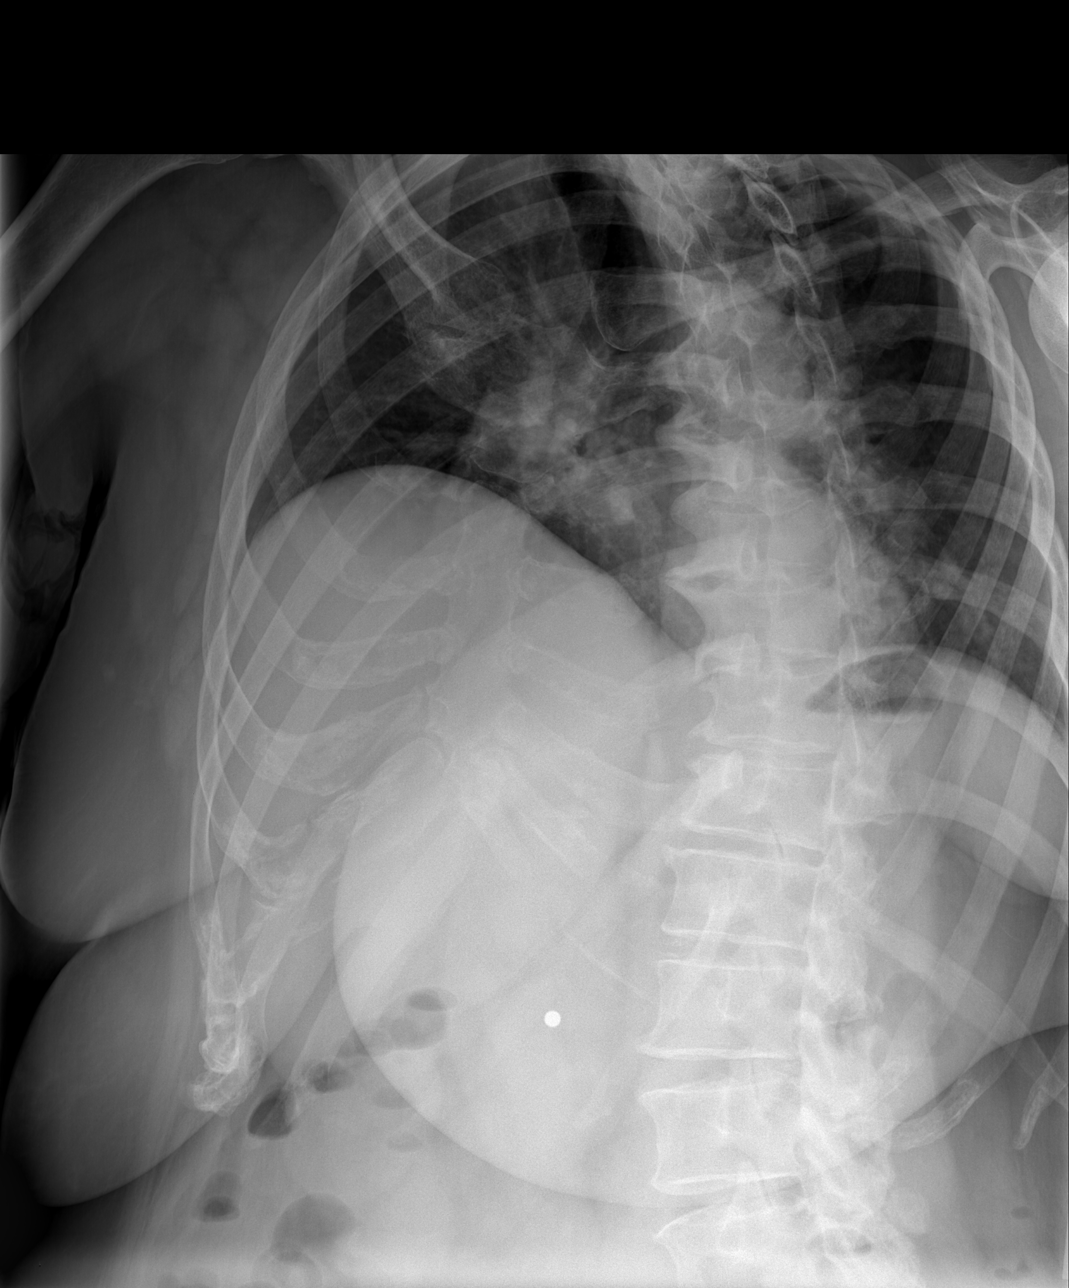

[w ribs oblique right *]
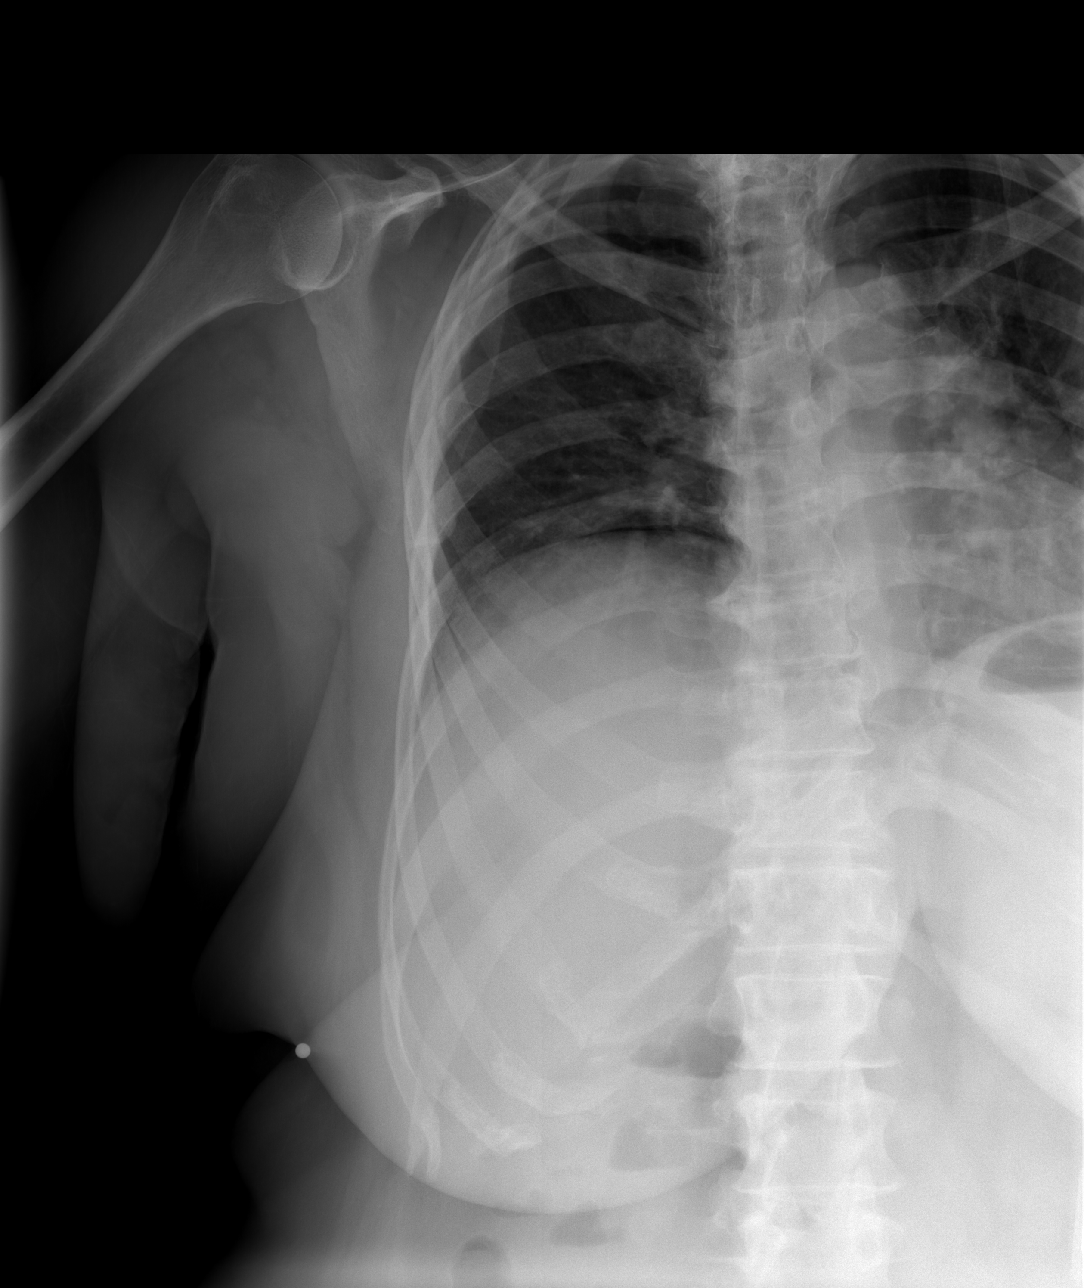

[4 of 4 positions shown; findings below may reference images not displayed]

FINDINGS: Chronic elevation of the right hemidiaphragm.  No pneumothorax.  The cardiomediastinal contours appear within normal limits.  A marker was placed over junction of the anterior right 8th and 9th ribs.  No displaced fracture is identified.   The markers appear placed over the region of the costochondral junction.
IMPRESSION: No displaced rib fracture is identified.

## 2007-08-24 ENCOUNTER — Inpatient Hospital Stay (HOSPITAL_COMMUNITY): Admission: EM | Admit: 2007-08-24 | Discharge: 2007-08-31 | Payer: Self-pay | Admitting: Emergency Medicine

## 2007-08-24 IMAGING — CT CT PELVIS W/ CM
1 of 3 series · 14 of 32 positions shown, 19 images · IV contrast (APPLIED)
Comparison: [DATE]

ABDOMEN CT WITH CONTRAST

CLINICAL DATA: Abdominal pain
TECHNIQUE: Multidetector CT imaging of the abdomen and pelvis was performed
following the standard protocol during bolus administration of intravenous
contrast.

Contrast:  125 cc Omnipaque 300

[Series 2: abd_pel 5.0 b40f st · axial · 0.79mm/px · z∈[-326,+108]mm · 14 of 99 slices shown, 19 images]
[im 6/99  soft-tissue]
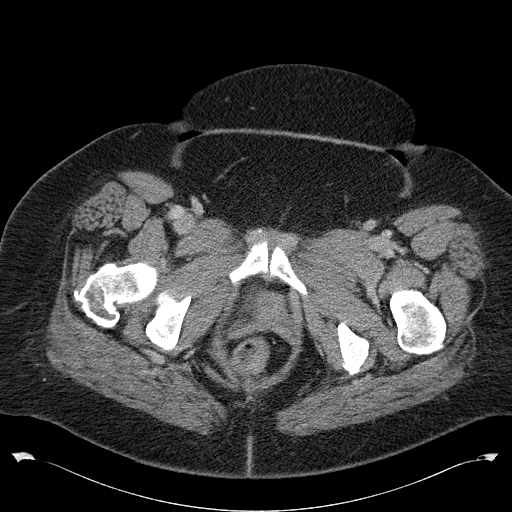
[im 6/99  bone]
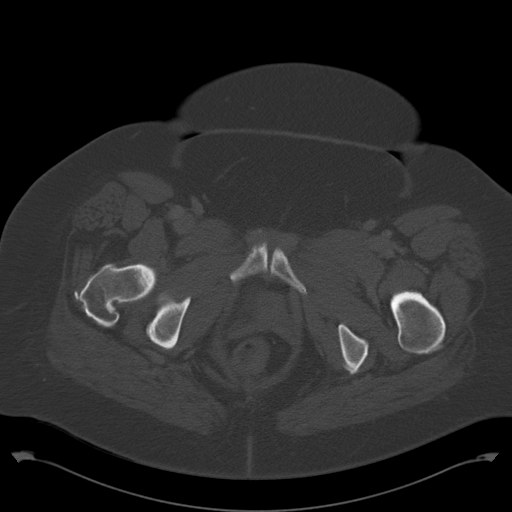
[im 11/99  soft-tissue]
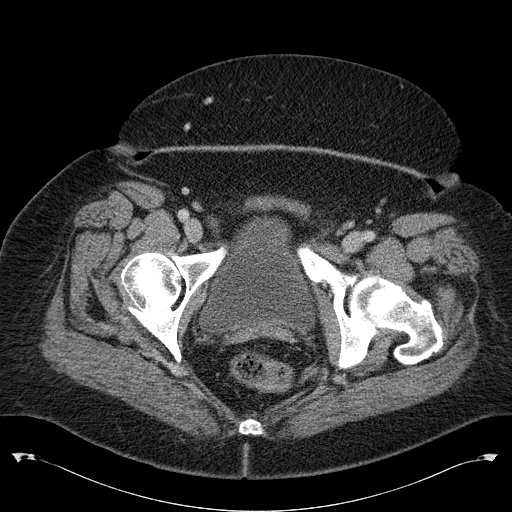
[im 22/99  soft-tissue]
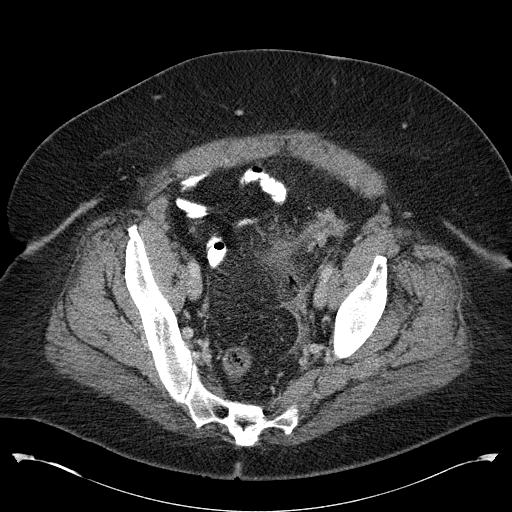
[im 28/99  soft-tissue]
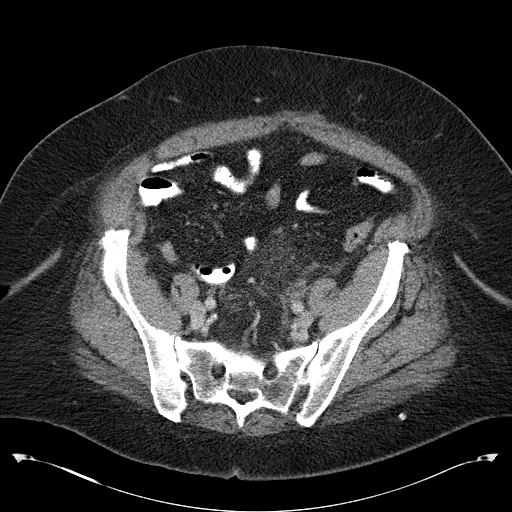
[im 33/99  soft-tissue]
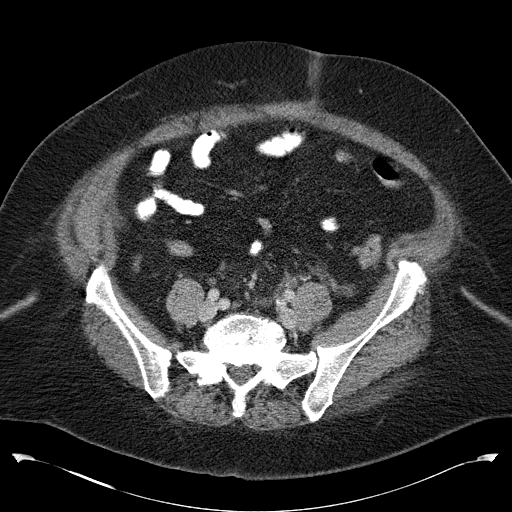
[im 44/99  soft-tissue]
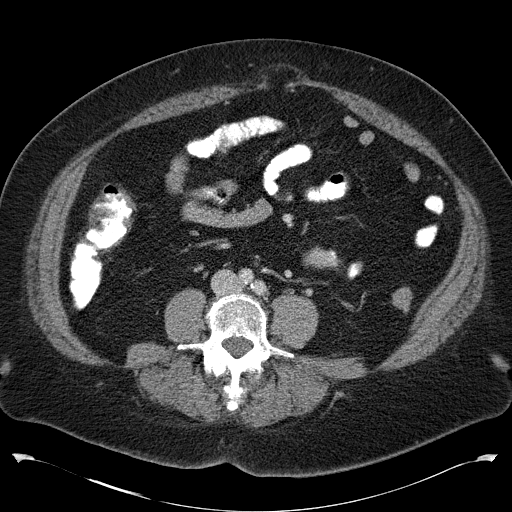
[im 50/99  soft-tissue]
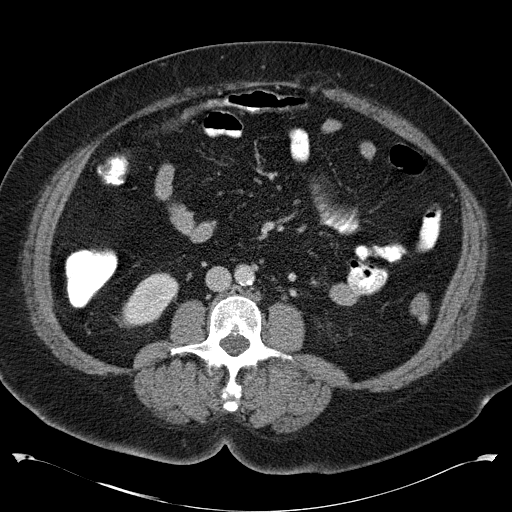
[im 55/99  soft-tissue]
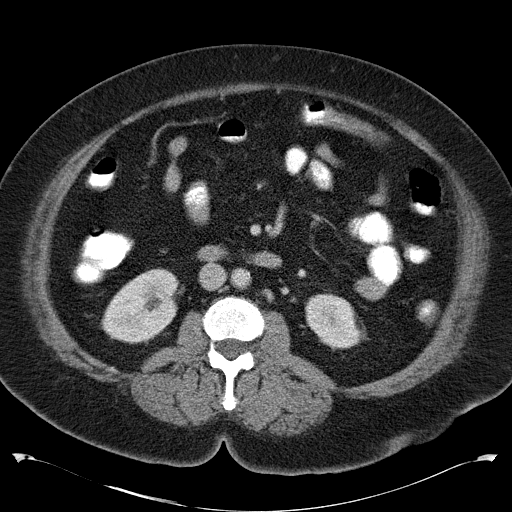
[im 66/99  soft-tissue]
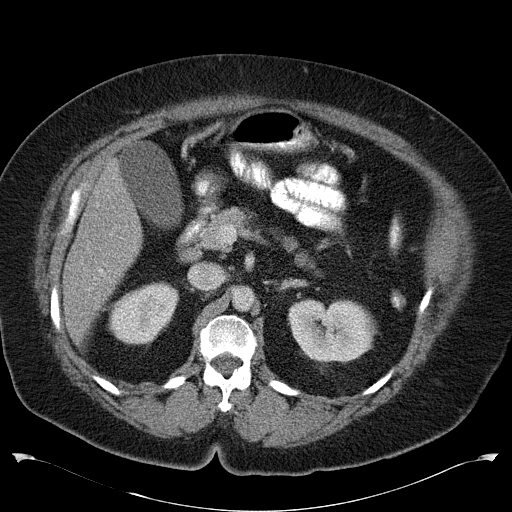
[im 66/99  bone]
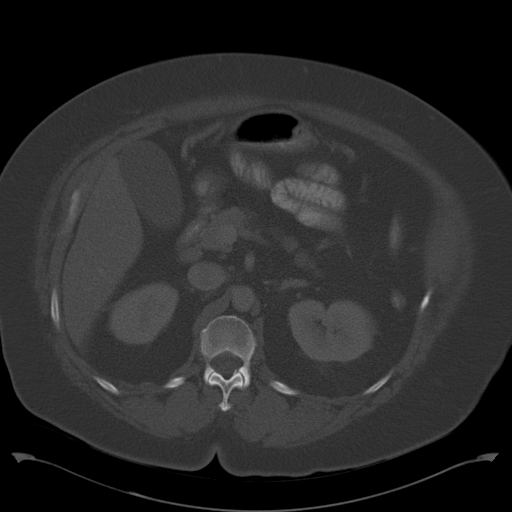
[im 71/99  soft-tissue]
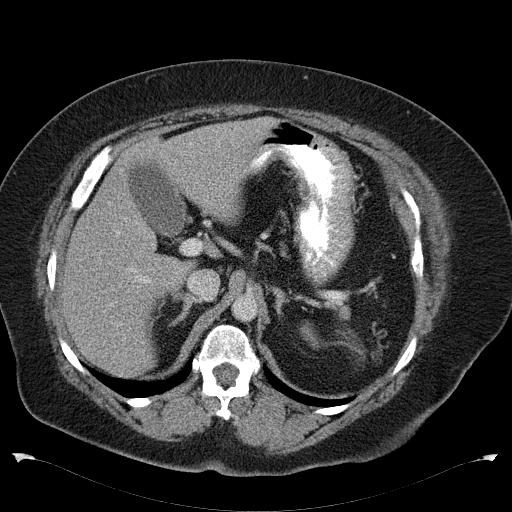
[im 77/99  soft-tissue]
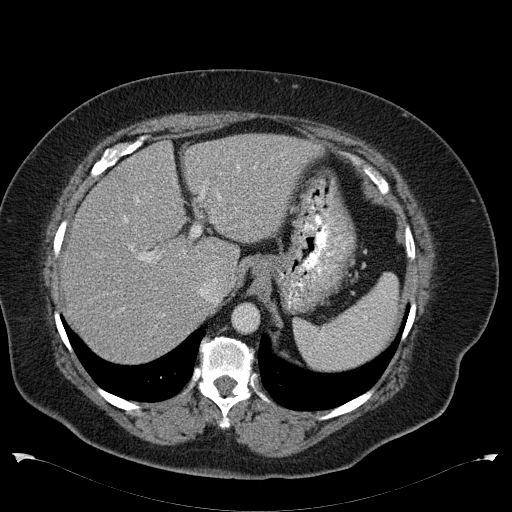
[im 77/99  lung]
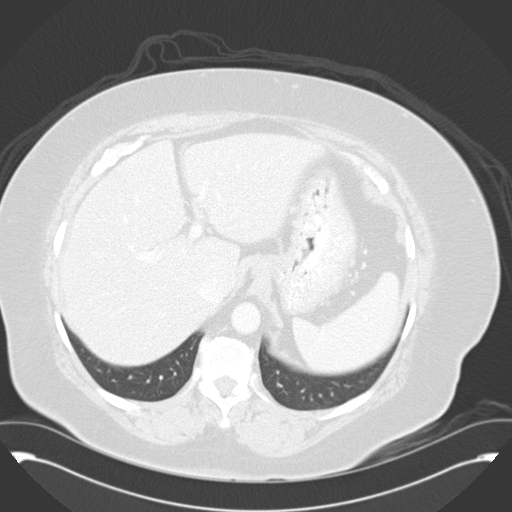
[im 82/99  lung]
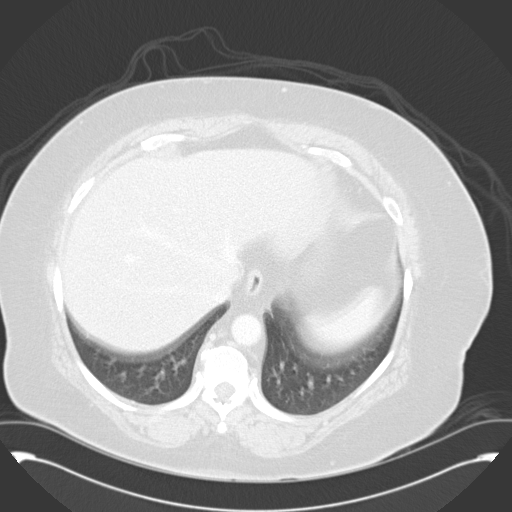
[im 88/99  soft-tissue]
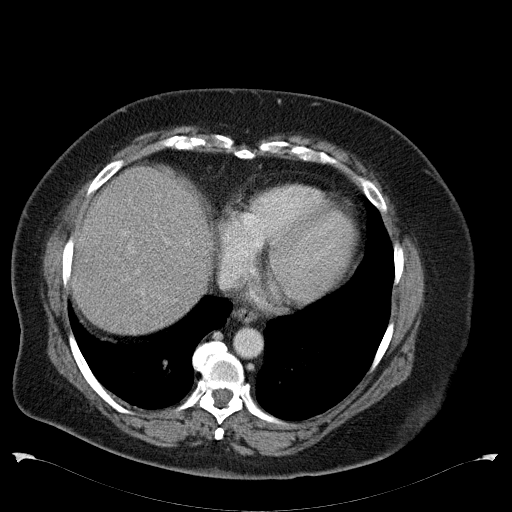
[im 88/99  lung]
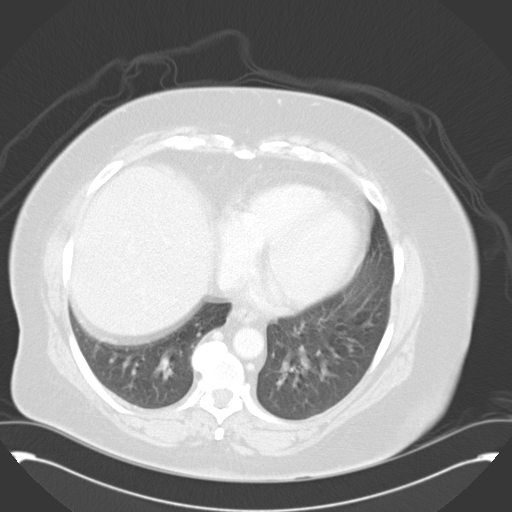
[im 93/99  soft-tissue]
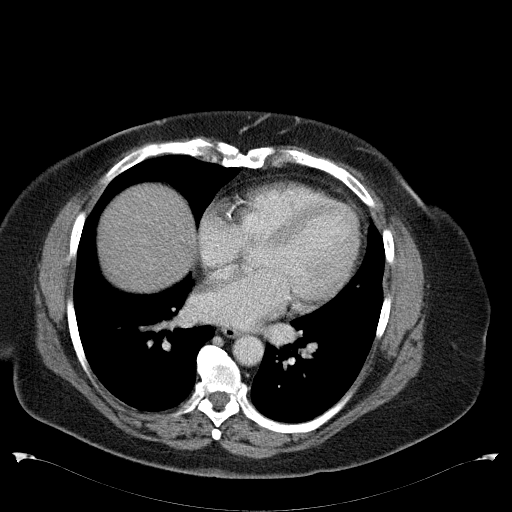
[im 93/99  lung]
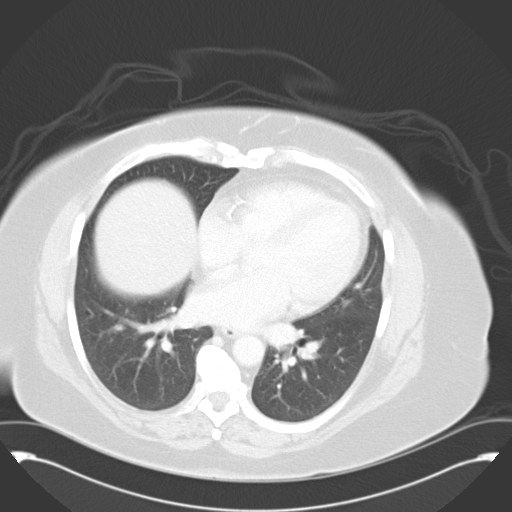

[14 of 32 positions shown; findings below may reference images not displayed]

FINDINGS: Mild fatty infiltration of the liver noted. Spleen, pancreas,
adrenals, kidneys, gallbladder are unremarkable. Small upper abdominal lymph
nodes noted, no pathologically enlarged. No free fluid, free air, or adenopathy.
Scattered descending colonic diverticula.

Lung bases are clear. Degenerative changes in the thoracolumbar spine.

IMPRESSION

Mild fatty infiltration of the liver.

PELVIS CT WITH CONTRAST
FINDINGS: Sigmoid diverticular disease noted. There is stranding noted around
the sigmoid colon and extraluminal gas compatible with sigmoid diverticulitis
and probable microperforation. No focal fluid collection to suggest abscess at
this time. Appendix is visualized and is normal.

Patient is status post hysterectomy.

IMPRESSION

Sigmoid diverticulitis. There is extraluminal gas present, likely related to
microperforation.

## 2007-08-26 IMAGING — CT CT PELVIS W/ CM
2 of 3 series · 17 of 46 positions shown, 19 images · IV contrast (omnipaque)
Comparison: [DATE]

CLINICAL DATA: Followup diverticulitis

PELVIS CT WITH CONTRAST
TECHNIQUE: Multidetector CT imaging of the pelvis was performed following the
standard protocol during administration of intravenous contrast.
Contrast:  100 cc Omnipaque 300

[Series 3: rtn pelvis 5.0 b40f · axial · 0.90mm/px · z∈[-128,+68]mm · 14 of 45 slices shown, 16 images]
[im 3/45  soft-tissue]
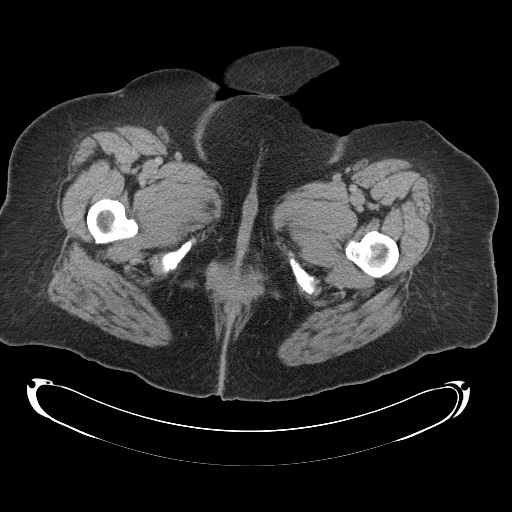
[im 3/45  bone]
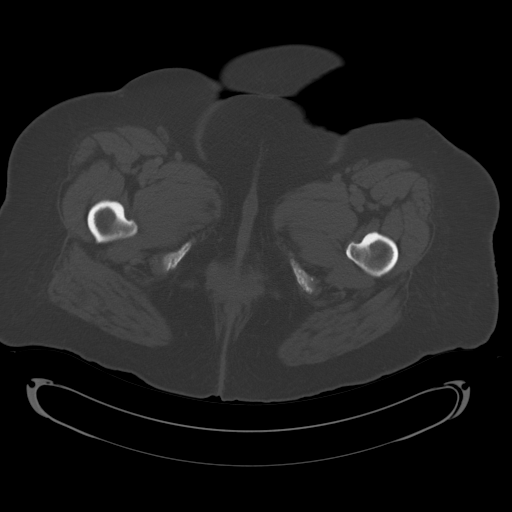
[im 6/45  soft-tissue]
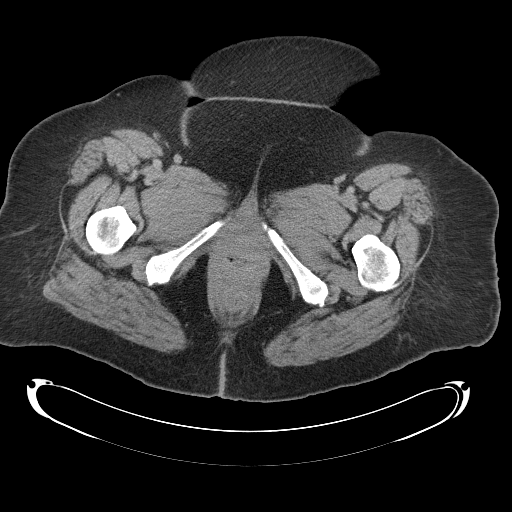
[im 9/45  soft-tissue]
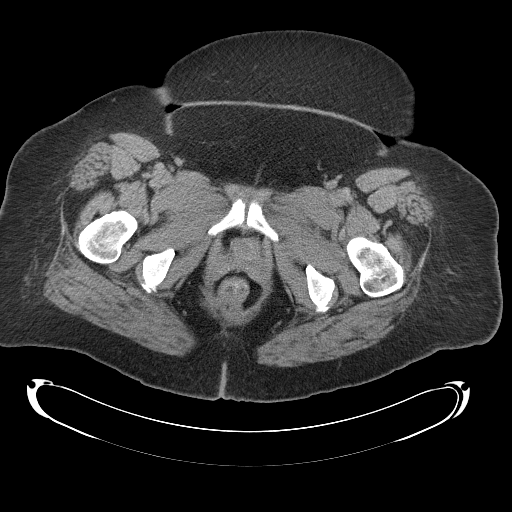
[im 12/45  soft-tissue]
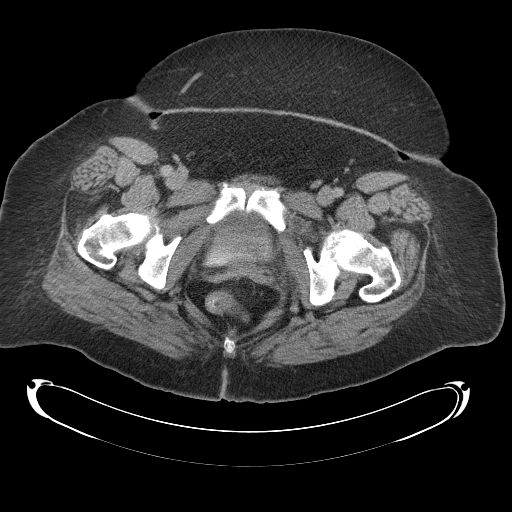
[im 15/45  soft-tissue]
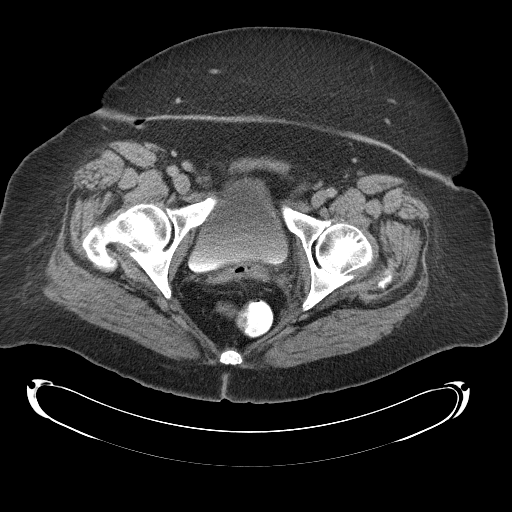
[im 18/45  soft-tissue]
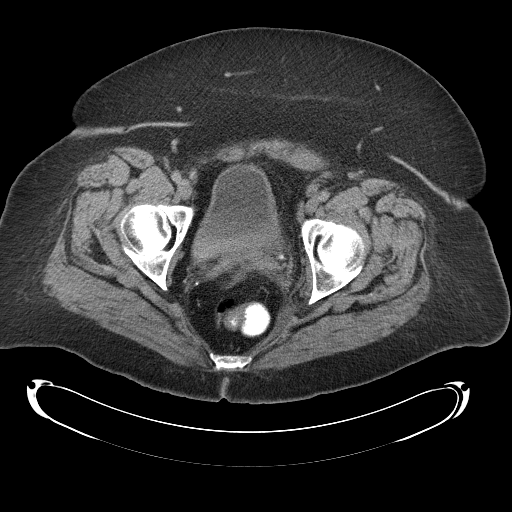
[im 20/45  soft-tissue]
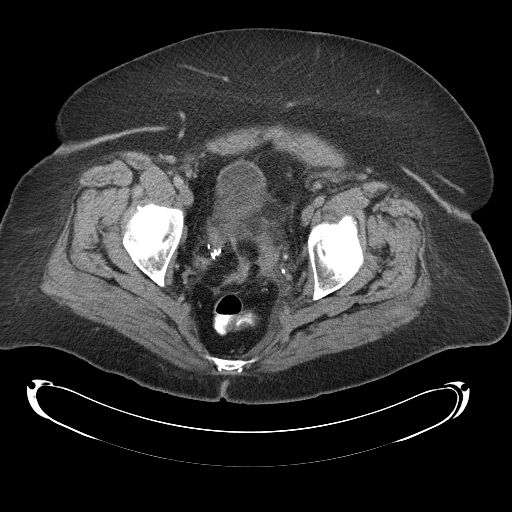
[im 25/45  soft-tissue]
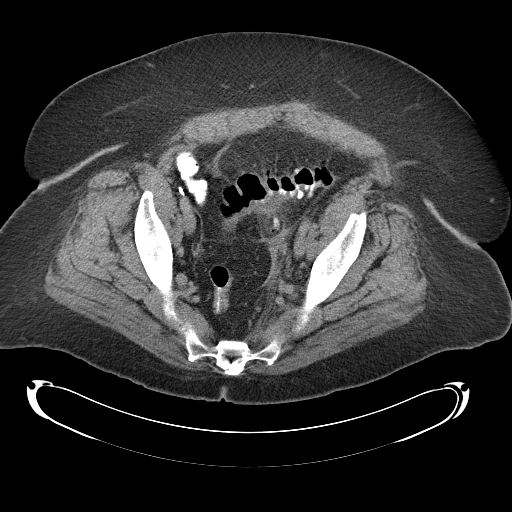
[im 27/45  soft-tissue]
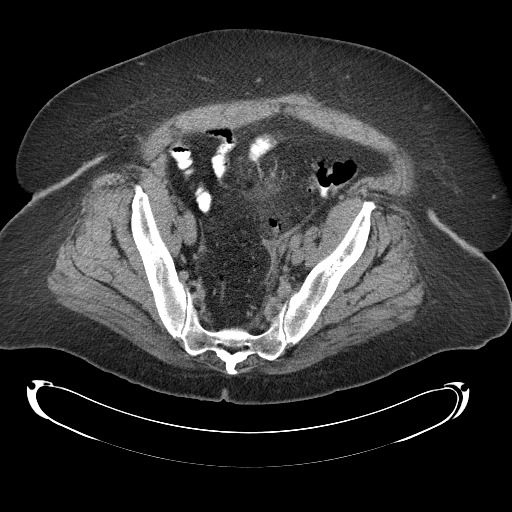
[im 27/45  bone]
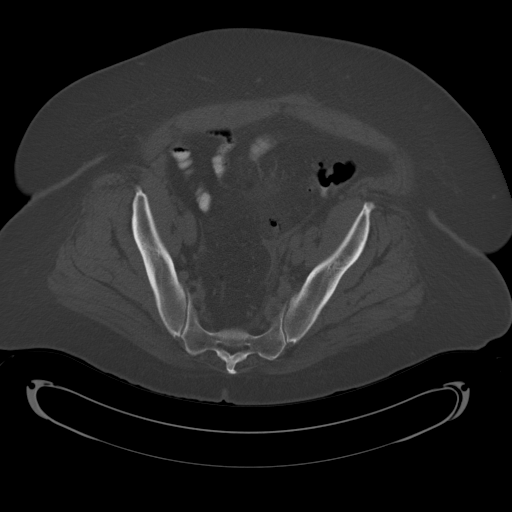
[im 30/45  soft-tissue]
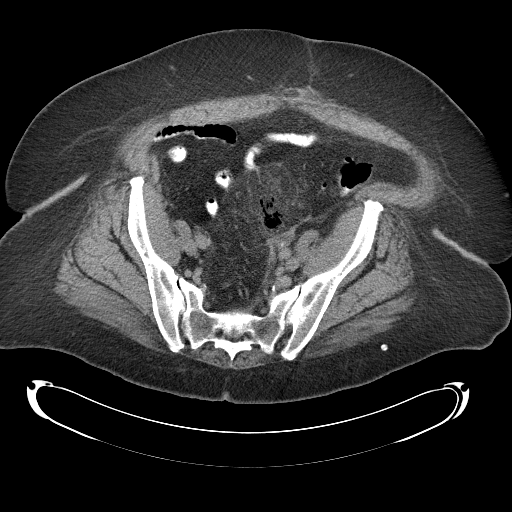
[im 33/45  soft-tissue]
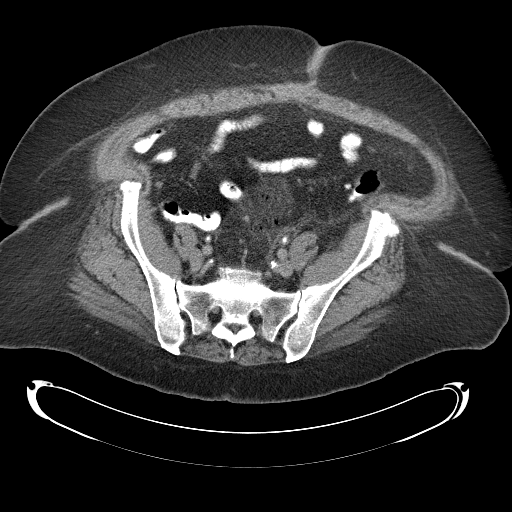
[im 36/45  soft-tissue]
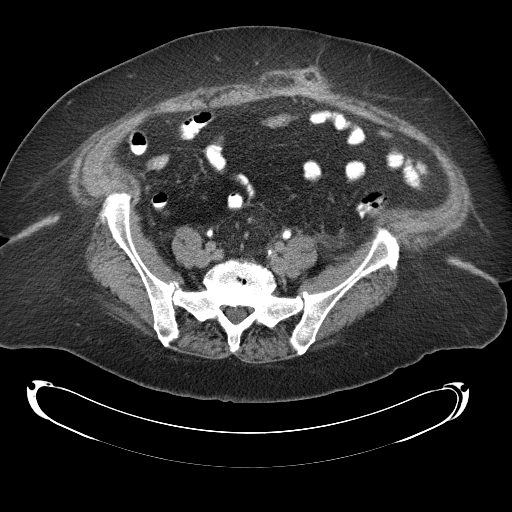
[im 39/45  soft-tissue]
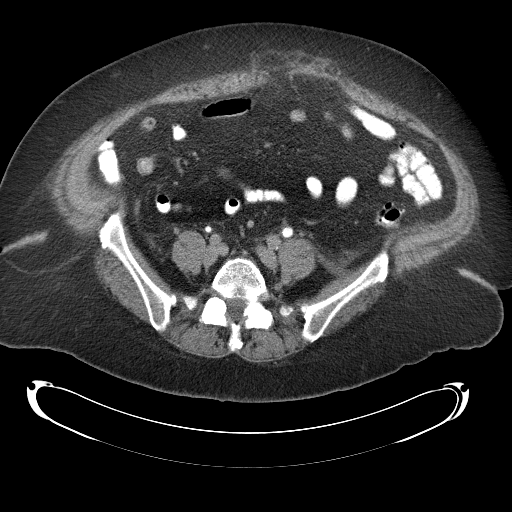
[im 42/45  soft-tissue]
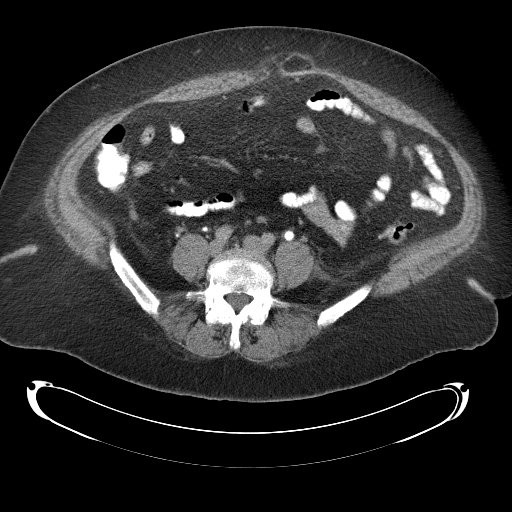

[Series 602: <mpr thick range> · coronal · 0.90mm/px · 3 of 92 slices shown]
[im 31/92  soft-tissue]
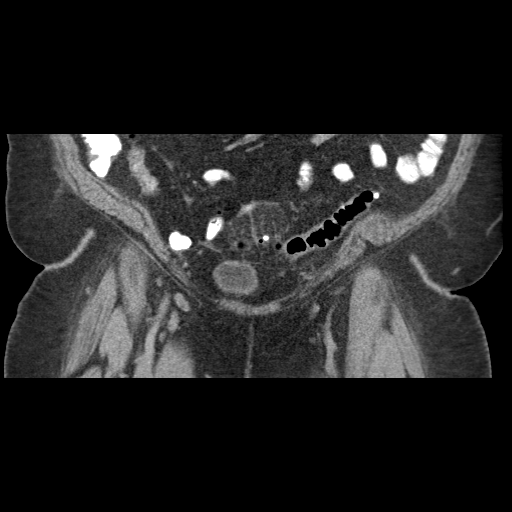
[im 41/92  soft-tissue]
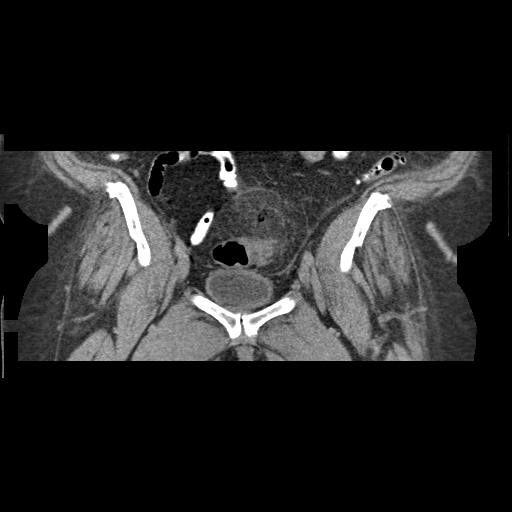
[im 51/92  soft-tissue]
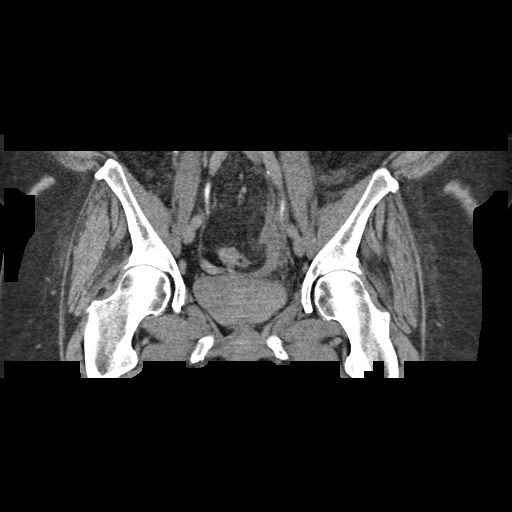

[17 of 46 positions shown; findings below may reference images not displayed]

FINDINGS: Sigmoid colon diverticulosis with mild wall thickening and
pericolonic edema again identified. Small locule of air in the sigmoid
mesocolon. Mild extension more superiorly and medially today on image 15. No
well-defined fluid collection to suggest drainable abscess. Small amount of
contrast in the inferior aspect of the gas collection on image 21 consistent
with microperforation.

Normal pelvic small bowel loops. No pelvic adenopathy. Mildly prominent left
ureteric at level of pelvic brim. Hysterectomy. Normal urinary bladder and
adnexa. Surgical clips in the right pelvis.

Complex ventral abdominal wall hernia or hernias are incompletely imaged. Right
paracentral image 7. Omental fat within.

Lumbar spondylosis

IMPRESSION

1. Persistent diverticulitis with microperforation and slightly increased
pericolonic gas. No well defined collection to suggest abscess.
2. Right paracentral ventral hernia contains only fat. 
3. Mildly prominent left ureter at level of pelvic brim. This asymmetry could be
incidental or related to a component of mass-effect on the distal left ureter by
the pelvic inflammatory process. As the abdomen is not imaged on the current
study, renal ultrasound should be considered if there is any suspicion of
left-sided hydronephrosis or renal insufficiency.

## 2007-08-30 ENCOUNTER — Ambulatory Visit: Payer: Self-pay | Admitting: Internal Medicine

## 2007-09-09 ENCOUNTER — Ambulatory Visit: Payer: Self-pay | Admitting: Internal Medicine

## 2007-09-09 DIAGNOSIS — M109 Gout, unspecified: Secondary | ICD-10-CM

## 2007-09-09 DIAGNOSIS — K5730 Diverticulosis of large intestine without perforation or abscess without bleeding: Secondary | ICD-10-CM | POA: Insufficient documentation

## 2007-09-09 DIAGNOSIS — Z8719 Personal history of other diseases of the digestive system: Secondary | ICD-10-CM | POA: Insufficient documentation

## 2007-09-09 HISTORY — DX: Gout, unspecified: M10.9

## 2007-09-09 LAB — CONVERTED CEMR LAB
BUN: 12 mg/dL (ref 6–23)
CO2: 28 meq/L (ref 19–32)
Calcium: 9.5 mg/dL (ref 8.4–10.5)
Chloride: 97 meq/L (ref 96–112)
Creatinine, Ser: 1.1 mg/dL (ref 0.4–1.2)
GFR calc Af Amer: 64 mL/min
GFR calc non Af Amer: 53 mL/min
Glucose, Bld: 97 mg/dL (ref 70–99)
Potassium: 4.1 meq/L (ref 3.5–5.1)
Sodium: 133 meq/L — ABNORMAL LOW (ref 135–145)
Uric Acid, Serum: 9 mg/dL — ABNORMAL HIGH (ref 2.4–7.0)

## 2007-09-10 ENCOUNTER — Telehealth: Payer: Self-pay | Admitting: *Deleted

## 2007-09-10 ENCOUNTER — Telehealth: Payer: Self-pay | Admitting: Internal Medicine

## 2007-09-10 DIAGNOSIS — M1710 Unilateral primary osteoarthritis, unspecified knee: Secondary | ICD-10-CM | POA: Insufficient documentation

## 2007-09-18 ENCOUNTER — Telehealth: Payer: Self-pay | Admitting: Internal Medicine

## 2007-09-23 ENCOUNTER — Encounter: Payer: Self-pay | Admitting: Internal Medicine

## 2007-10-02 ENCOUNTER — Ambulatory Visit: Payer: Self-pay | Admitting: Internal Medicine

## 2007-10-09 ENCOUNTER — Ambulatory Visit: Admission: RE | Admit: 2007-10-09 | Discharge: 2007-10-09 | Payer: Self-pay | Admitting: Surgery

## 2007-10-09 IMAGING — CR DG CHEST 2V
1 series · 1 of 1 positions shown · non-contrast
Comparison: [DATE].

CLINICAL DATA: Preop.  Recurrent diverticulitis. History of tachycardia and bradycardia. 
 CHEST - 2 VIEW ? [DATE]:

[w chest pa]
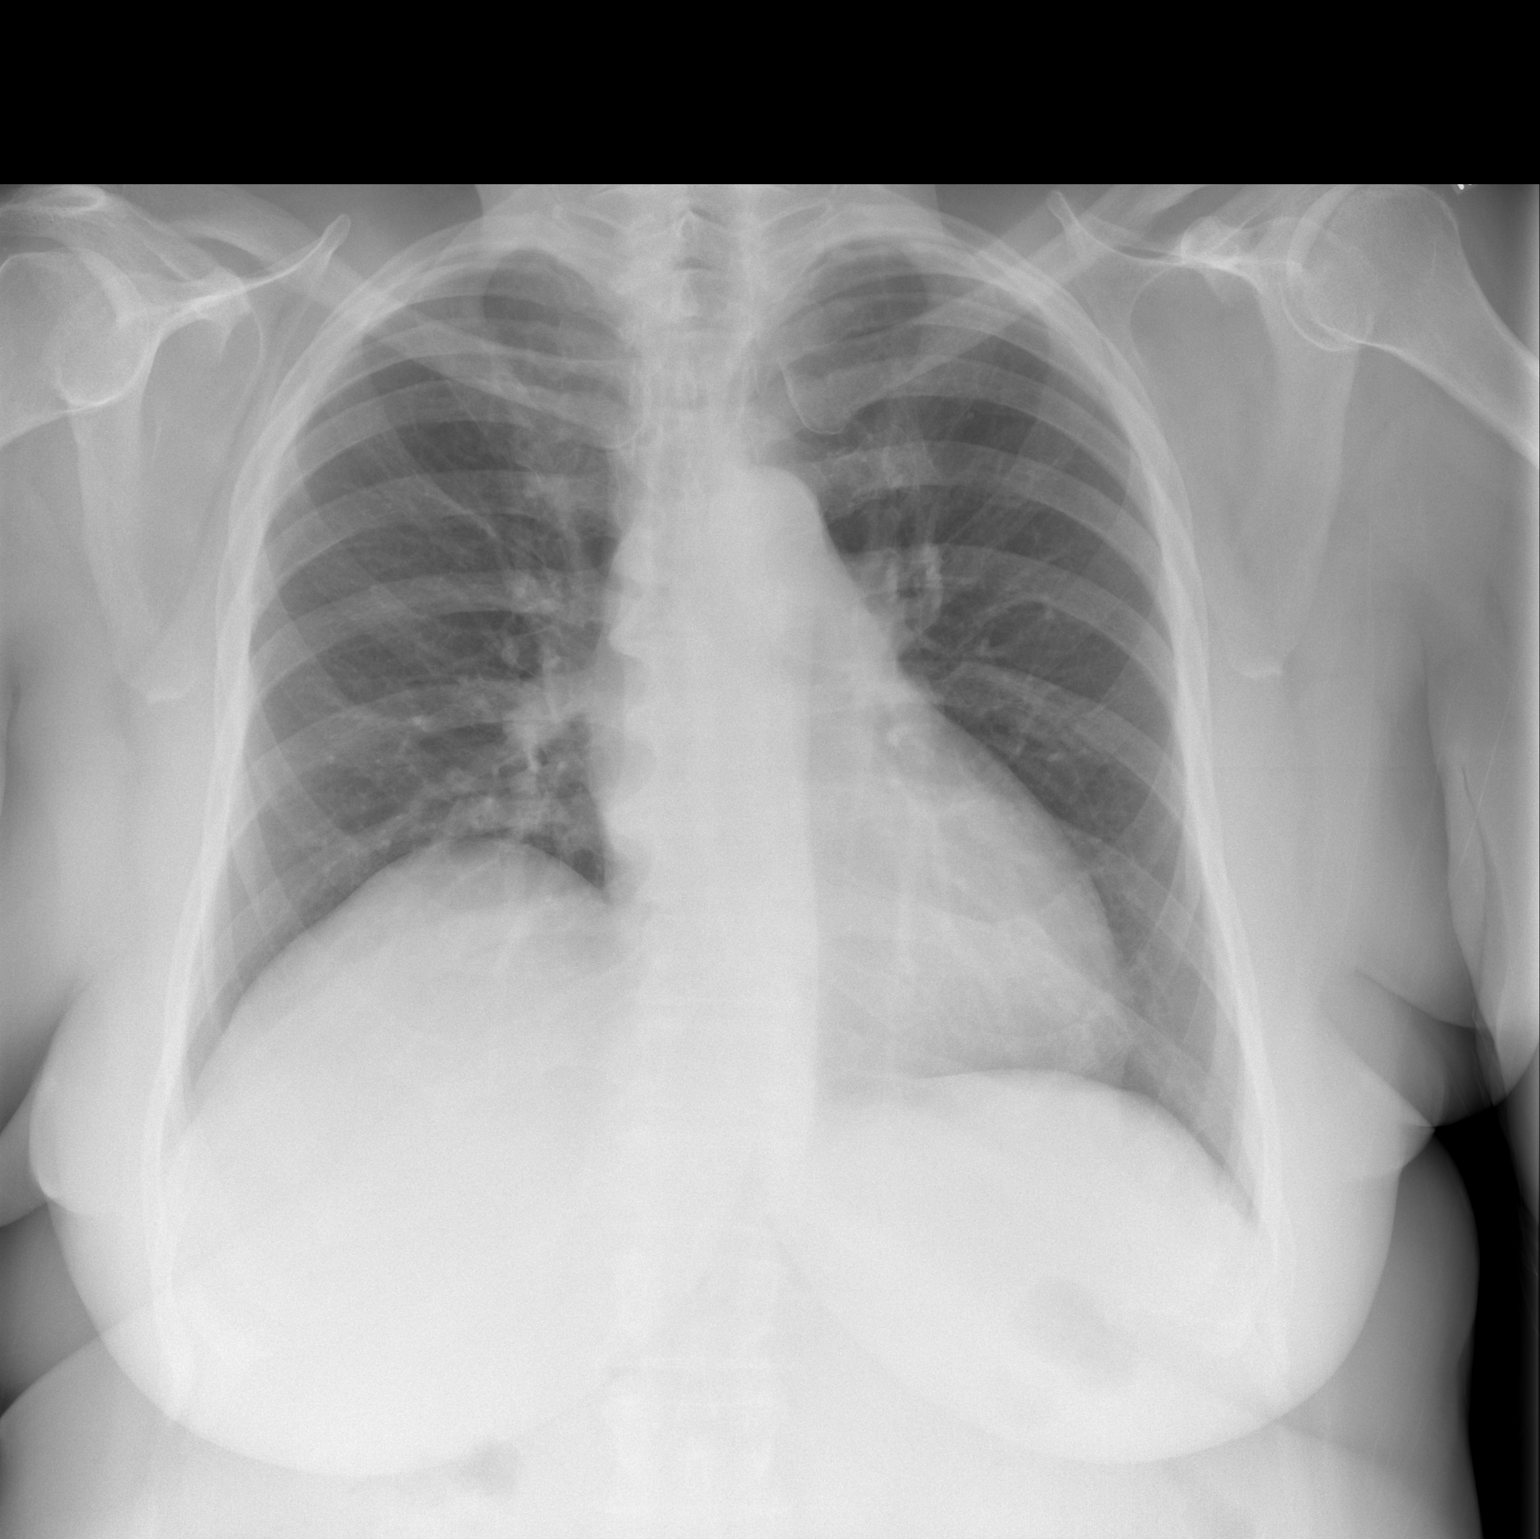

[1 of 1 positions shown; findings below may reference images not displayed]

FINDINGS: The trachea is midline.  Heart size is stable.  Lungs are clear.  No pleural fluid.
IMPRESSION: No acute findings.

## 2007-10-16 ENCOUNTER — Inpatient Hospital Stay (HOSPITAL_COMMUNITY): Admission: RE | Admit: 2007-10-16 | Discharge: 2007-10-22 | Payer: Self-pay | Admitting: Surgery

## 2007-10-16 ENCOUNTER — Encounter (INDEPENDENT_AMBULATORY_CARE_PROVIDER_SITE_OTHER): Payer: Self-pay | Admitting: Surgery

## 2007-11-07 ENCOUNTER — Encounter: Payer: Self-pay | Admitting: Internal Medicine

## 2007-11-25 ENCOUNTER — Telehealth: Payer: Self-pay | Admitting: Internal Medicine

## 2007-11-26 ENCOUNTER — Telehealth: Payer: Self-pay | Admitting: Internal Medicine

## 2007-11-28 ENCOUNTER — Telehealth: Payer: Self-pay | Admitting: Internal Medicine

## 2007-12-04 ENCOUNTER — Ambulatory Visit: Payer: Self-pay | Admitting: Internal Medicine

## 2007-12-04 LAB — CONVERTED CEMR LAB
ALT: 26 units/L (ref 0–35)
AST: 25 units/L (ref 0–37)
Albumin: 3.8 g/dL (ref 3.5–5.2)
Alkaline Phosphatase: 50 units/L (ref 39–117)
Bilirubin, Direct: 0.2 mg/dL (ref 0.0–0.3)
Cholesterol: 270 mg/dL (ref 0–200)
Direct LDL: 197.5 mg/dL
HDL: 42 mg/dL (ref >39.0)
Total Bilirubin: 1.3 mg/dL — ABNORMAL HIGH (ref 0.3–1.2)
Total CHOL/HDL Ratio: 6.4
Total Protein: 6.9 g/dL (ref 6.0–8.3)
Triglycerides: 141 mg/dL (ref 0–149)
VLDL: 28 mg/dL (ref 0–40)

## 2007-12-09 ENCOUNTER — Ambulatory Visit: Payer: Self-pay | Admitting: Internal Medicine

## 2007-12-09 LAB — CONVERTED CEMR LAB
BUN: 17 mg/dL (ref 6–23)
CO2: 30 meq/L (ref 19–32)
Calcium: 9.4 mg/dL (ref 8.4–10.5)
Chloride: 97 meq/L (ref 96–112)
Creatinine, Ser: 1.2 mg/dL (ref 0.4–1.2)
GFR calc Af Amer: 58 mL/min
GFR calc non Af Amer: 48 mL/min
Glucose, Bld: 137 mg/dL — ABNORMAL HIGH (ref 70–99)
Potassium: 4.2 meq/L (ref 3.5–5.1)
Sodium: 137 meq/L (ref 135–145)

## 2007-12-12 ENCOUNTER — Telehealth: Payer: Self-pay | Admitting: Internal Medicine

## 2008-01-16 ENCOUNTER — Ambulatory Visit: Payer: Self-pay | Admitting: Internal Medicine

## 2008-01-16 ENCOUNTER — Telehealth: Payer: Self-pay | Admitting: Internal Medicine

## 2008-01-20 ENCOUNTER — Telehealth: Payer: Self-pay | Admitting: Internal Medicine

## 2008-01-24 ENCOUNTER — Encounter: Payer: Self-pay | Admitting: Internal Medicine

## 2008-02-04 ENCOUNTER — Encounter: Payer: Self-pay | Admitting: Internal Medicine

## 2008-02-12 ENCOUNTER — Telehealth: Payer: Self-pay | Admitting: Internal Medicine

## 2008-02-13 ENCOUNTER — Ambulatory Visit: Payer: Self-pay | Admitting: Internal Medicine

## 2008-02-13 LAB — CONVERTED CEMR LAB
Cholesterol, target level: 200 mg/dL
HDL goal, serum: 40 mg/dL
LDL Goal: 130 mg/dL

## 2008-02-25 ENCOUNTER — Encounter: Payer: Self-pay | Admitting: Internal Medicine

## 2008-03-10 ENCOUNTER — Ambulatory Visit: Payer: Self-pay | Admitting: Internal Medicine

## 2008-03-10 LAB — CONVERTED CEMR LAB
ALT: 18 units/L (ref 0–35)
AST: 22 units/L (ref 0–37)
Albumin: 3.5 g/dL (ref 3.5–5.2)
Alkaline Phosphatase: 48 units/L (ref 39–117)
BUN: 16 mg/dL (ref 6–23)
Bilirubin, Direct: 0.1 mg/dL (ref 0.0–0.3)
CO2: 31 meq/L (ref 19–32)
Calcium: 9.5 mg/dL (ref 8.4–10.5)
Chloride: 107 meq/L (ref 96–112)
Cholesterol: 191 mg/dL (ref 0–200)
Creatinine, Ser: 1.1 mg/dL (ref 0.4–1.2)
GFR calc Af Amer: 64 mL/min
GFR calc non Af Amer: 53 mL/min
Glucose, Bld: 79 mg/dL (ref 70–99)
HDL: 39.3 mg/dL (ref >39.0)
LDL Cholesterol: 137 mg/dL — ABNORMAL HIGH (ref 0–99)
Potassium: 4.1 meq/L (ref 3.5–5.1)
Sodium: 141 meq/L (ref 135–145)
Total Bilirubin: 0.9 mg/dL (ref 0.3–1.2)
Total CHOL/HDL Ratio: 4.9
Total Protein: 7.1 g/dL (ref 6.0–8.3)
Triglycerides: 72 mg/dL (ref 0–149)
VLDL: 14 mg/dL (ref 0–40)

## 2008-03-25 ENCOUNTER — Telehealth: Payer: Self-pay | Admitting: Internal Medicine

## 2008-04-03 ENCOUNTER — Telehealth (INDEPENDENT_AMBULATORY_CARE_PROVIDER_SITE_OTHER): Payer: Self-pay | Admitting: *Deleted

## 2008-04-03 ENCOUNTER — Ambulatory Visit: Payer: Self-pay | Admitting: Internal Medicine

## 2008-05-29 ENCOUNTER — Ambulatory Visit: Payer: Self-pay | Admitting: Internal Medicine

## 2008-05-29 LAB — CONVERTED CEMR LAB
ALT: 21 units/L (ref 0–35)
AST: 28 units/L (ref 0–37)
Albumin: 3.6 g/dL (ref 3.5–5.2)
Alkaline Phosphatase: 44 units/L (ref 39–117)
Bilirubin, Direct: 0.2 mg/dL (ref 0.0–0.3)
Cholesterol: 217 mg/dL (ref 0–200)
Direct LDL: 160.7 mg/dL
HDL: 38.9 mg/dL — ABNORMAL LOW (ref >39.0)
Total Bilirubin: 1.3 mg/dL — ABNORMAL HIGH (ref 0.3–1.2)
Total CHOL/HDL Ratio: 5.6
Total Protein: 6.9 g/dL (ref 6.0–8.3)
Triglycerides: 122 mg/dL (ref 0–149)
VLDL: 24 mg/dL (ref 0–40)

## 2008-06-05 ENCOUNTER — Ambulatory Visit: Payer: Self-pay | Admitting: Internal Medicine

## 2008-06-19 ENCOUNTER — Telehealth: Payer: Self-pay | Admitting: Internal Medicine

## 2008-06-30 ENCOUNTER — Ambulatory Visit: Payer: Self-pay | Admitting: Internal Medicine

## 2008-07-01 LAB — CONVERTED CEMR LAB
Basophils Absolute: 0.1 10*3/uL (ref 0.0–0.1)
Basophils Relative: 0.6 % (ref 0.0–3.0)
Eosinophils Absolute: 0.6 10*3/uL (ref 0.0–0.7)
Eosinophils Relative: 7.6 % — ABNORMAL HIGH (ref 0.0–5.0)
HCT: 38.9 % (ref 36.0–46.0)
Hemoglobin: 13.8 g/dL (ref 12.0–15.0)
Lymphocytes Relative: 30.5 % (ref 12.0–46.0)
MCHC: 35.5 g/dL (ref 30.0–36.0)
MCV: 87.1 fL (ref 78.0–100.0)
Monocytes Absolute: 1 10*3/uL (ref 0.1–1.0)
Monocytes Relative: 11.4 % (ref 3.0–12.0)
Neutro Abs: 4.1 10*3/uL (ref 1.4–7.7)
Neutrophils Relative %: 49.9 % (ref 43.0–77.0)
Platelets: 230 10*3/uL (ref 150–400)
RBC: 4.47 M/uL (ref 3.87–5.11)
RDW: 12.5 % (ref 11.5–14.6)
WBC: 8.4 10*3/uL (ref 4.5–10.5)

## 2008-07-20 ENCOUNTER — Telehealth: Payer: Self-pay | Admitting: Internal Medicine

## 2008-08-05 ENCOUNTER — Telehealth: Payer: Self-pay | Admitting: Internal Medicine

## 2008-08-05 ENCOUNTER — Telehealth (INDEPENDENT_AMBULATORY_CARE_PROVIDER_SITE_OTHER): Payer: Self-pay | Admitting: *Deleted

## 2008-08-13 ENCOUNTER — Encounter: Payer: Self-pay | Admitting: Internal Medicine

## 2008-08-21 ENCOUNTER — Telehealth: Payer: Self-pay | Admitting: Internal Medicine

## 2008-08-25 ENCOUNTER — Telehealth: Payer: Self-pay | Admitting: Internal Medicine

## 2008-08-26 ENCOUNTER — Encounter: Payer: Self-pay | Admitting: Internal Medicine

## 2008-08-26 ENCOUNTER — Ambulatory Visit: Payer: Self-pay | Admitting: Internal Medicine

## 2008-08-26 DIAGNOSIS — Z860100 Personal history of colon polyps, unspecified: Secondary | ICD-10-CM

## 2008-08-26 HISTORY — DX: Personal history of colonic polyps: Z86.010

## 2008-08-26 HISTORY — DX: Personal history of colon polyps, unspecified: Z86.0100

## 2008-08-28 ENCOUNTER — Encounter: Payer: Self-pay | Admitting: Internal Medicine

## 2008-09-04 ENCOUNTER — Ambulatory Visit: Payer: Self-pay | Admitting: Internal Medicine

## 2008-09-04 LAB — CONVERTED CEMR LAB
ALT: 23 units/L (ref 0–35)
AST: 28 units/L (ref 0–37)
Albumin: 3.6 g/dL (ref 3.5–5.2)
Alkaline Phosphatase: 49 units/L (ref 39–117)
BUN: 15 mg/dL (ref 6–23)
Bilirubin, Direct: 0.1 mg/dL (ref 0.0–0.3)
CO2: 30 meq/L (ref 19–32)
Calcium: 9.3 mg/dL (ref 8.4–10.5)
Chloride: 104 meq/L (ref 96–112)
Cholesterol: 176 mg/dL (ref 0–200)
Creatinine, Ser: 1.1 mg/dL (ref 0.4–1.2)
GFR calc Af Amer: 64 mL/min
GFR calc non Af Amer: 53 mL/min
Glucose, Bld: 107 mg/dL — ABNORMAL HIGH (ref 70–99)
HDL: 37.6 mg/dL — ABNORMAL LOW (ref >39.0)
LDL Cholesterol: 114 mg/dL — ABNORMAL HIGH (ref 0–99)
Potassium: 4 meq/L (ref 3.5–5.1)
Sodium: 140 meq/L (ref 135–145)
Total Bilirubin: 1 mg/dL (ref 0.3–1.2)
Total CHOL/HDL Ratio: 4.7
Total Protein: 7.4 g/dL (ref 6.0–8.3)
Triglycerides: 123 mg/dL (ref 0–149)
VLDL: 25 mg/dL (ref 0–40)

## 2008-09-18 ENCOUNTER — Telehealth: Payer: Self-pay | Admitting: Internal Medicine

## 2008-09-28 ENCOUNTER — Telehealth: Payer: Self-pay | Admitting: Internal Medicine

## 2008-10-06 ENCOUNTER — Ambulatory Visit: Payer: Self-pay | Admitting: Internal Medicine

## 2008-10-06 DIAGNOSIS — K219 Gastro-esophageal reflux disease without esophagitis: Secondary | ICD-10-CM | POA: Insufficient documentation

## 2008-12-04 ENCOUNTER — Encounter: Admission: RE | Admit: 2008-12-04 | Discharge: 2008-12-04 | Payer: Self-pay | Admitting: Internal Medicine

## 2008-12-04 IMAGING — MG MM SCREEN MAMMOGRAM BILATERAL
4 series · 4 of 4 positions shown · non-contrast
Comparison: none

DG SCREEN MAMMOGRAM BILATERAL
Bilateral CC and MLO view(s) were taken.

DIGITAL SCREENING MAMMOGRAM WITH CAD:
There are scattered fibroglandular densities.  A possible mass is noted in the right breast.  Spot 
compression views and possibly sonography are recommended for further evaluation.  In the left 
breast, no masses or malignant type calcifications are identified.  Compared with prior studies.

[R CC]
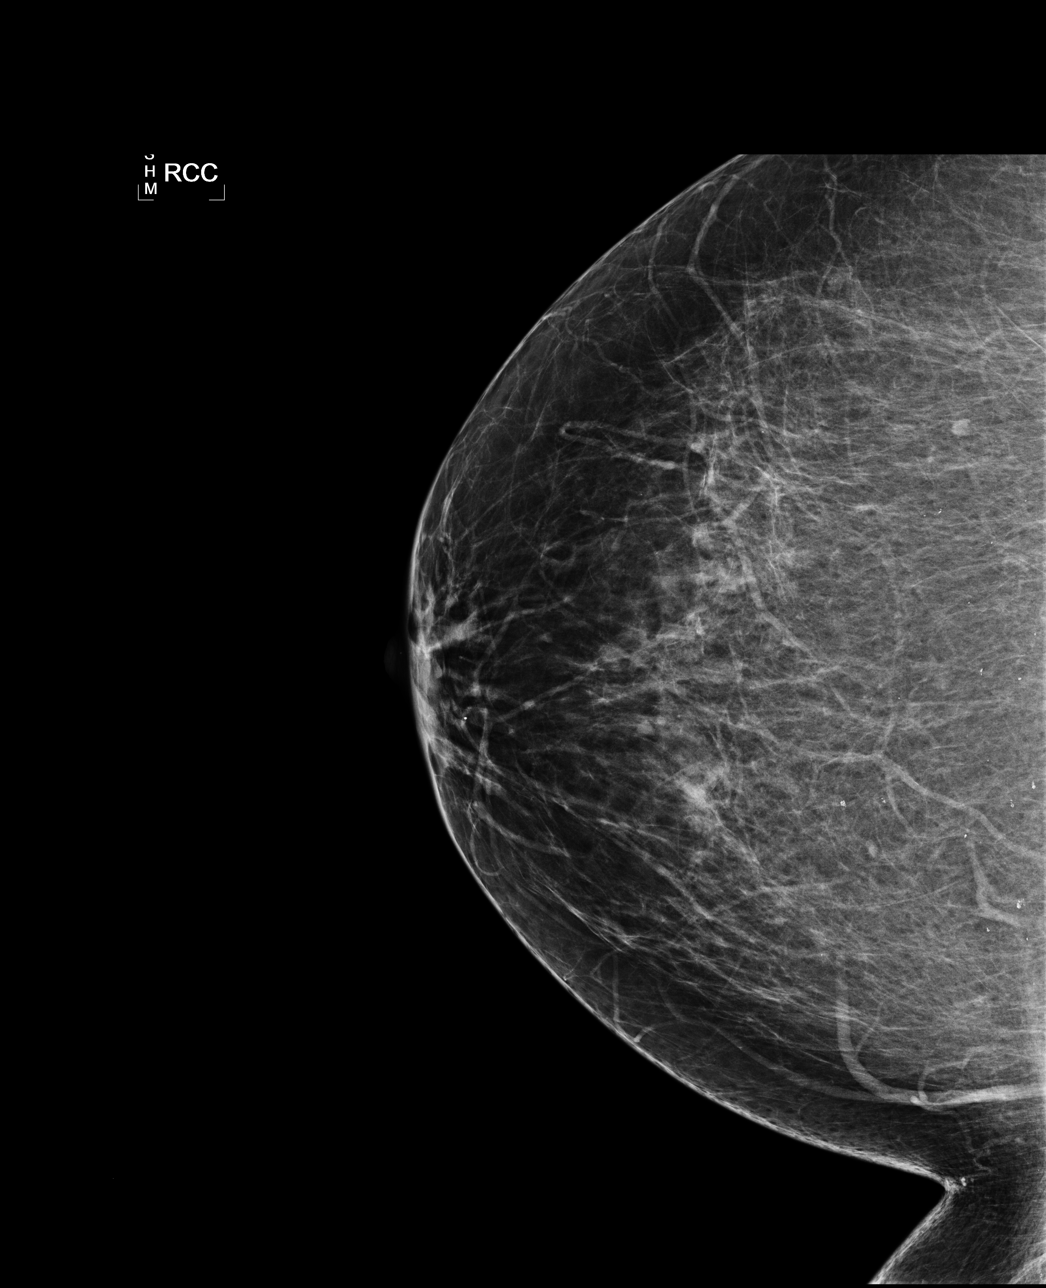

[L CC]
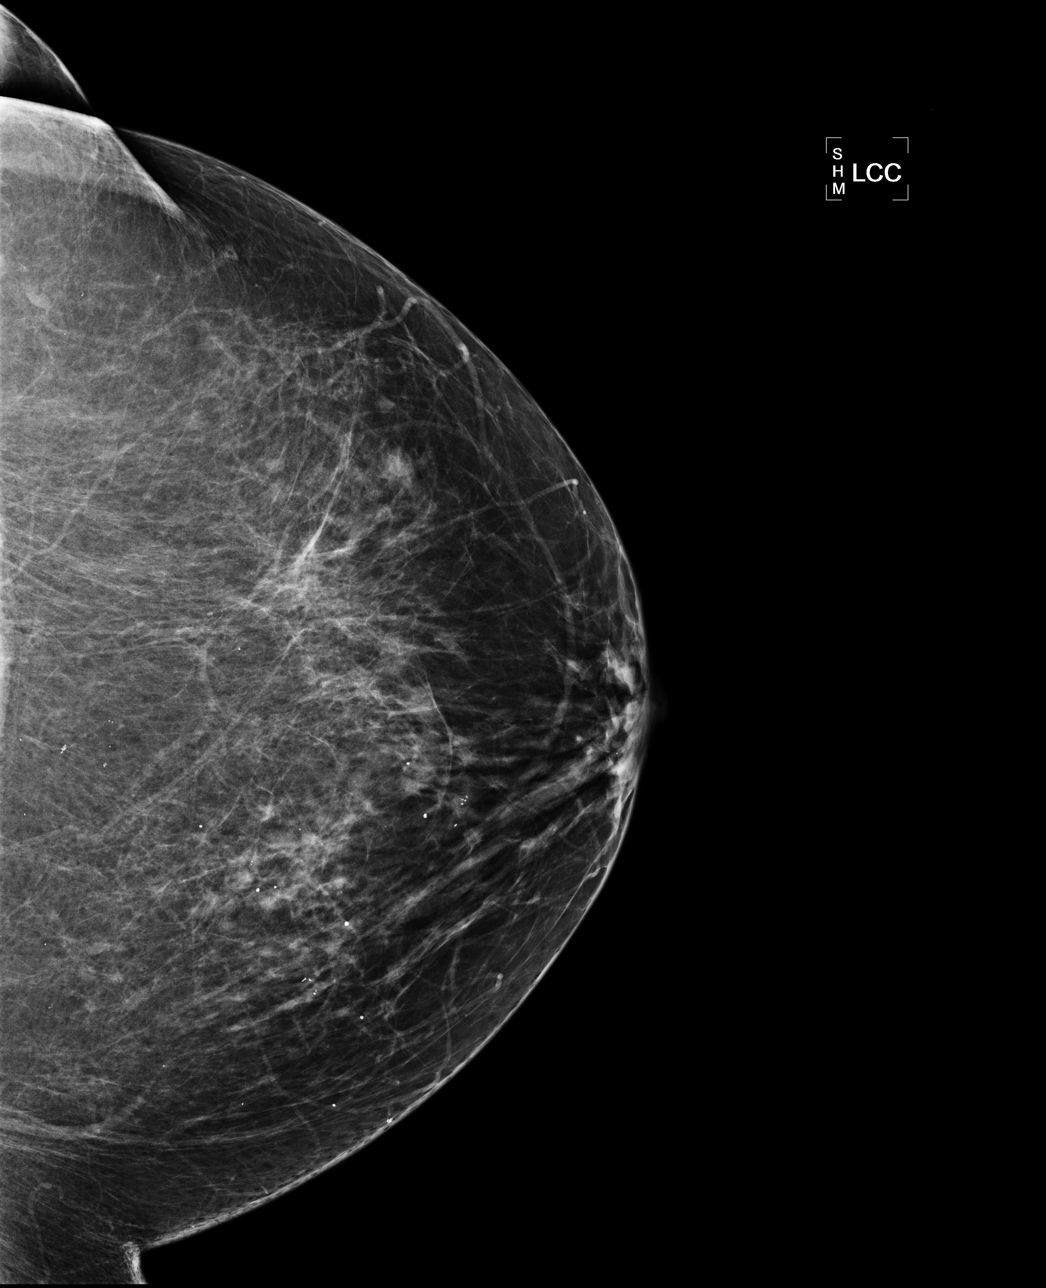

[L MLO]
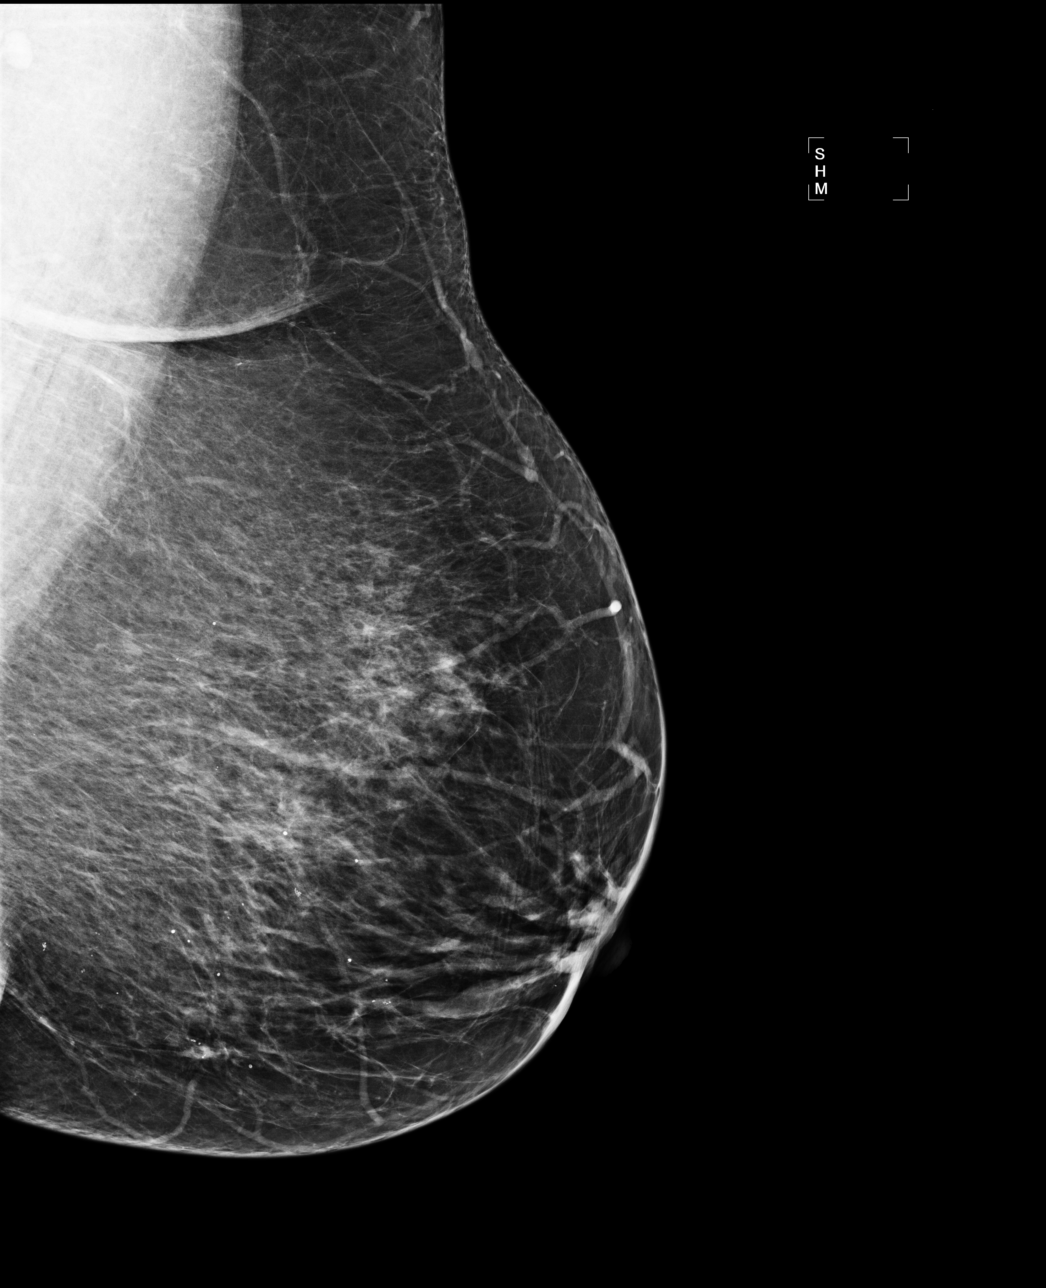

[R MLO]
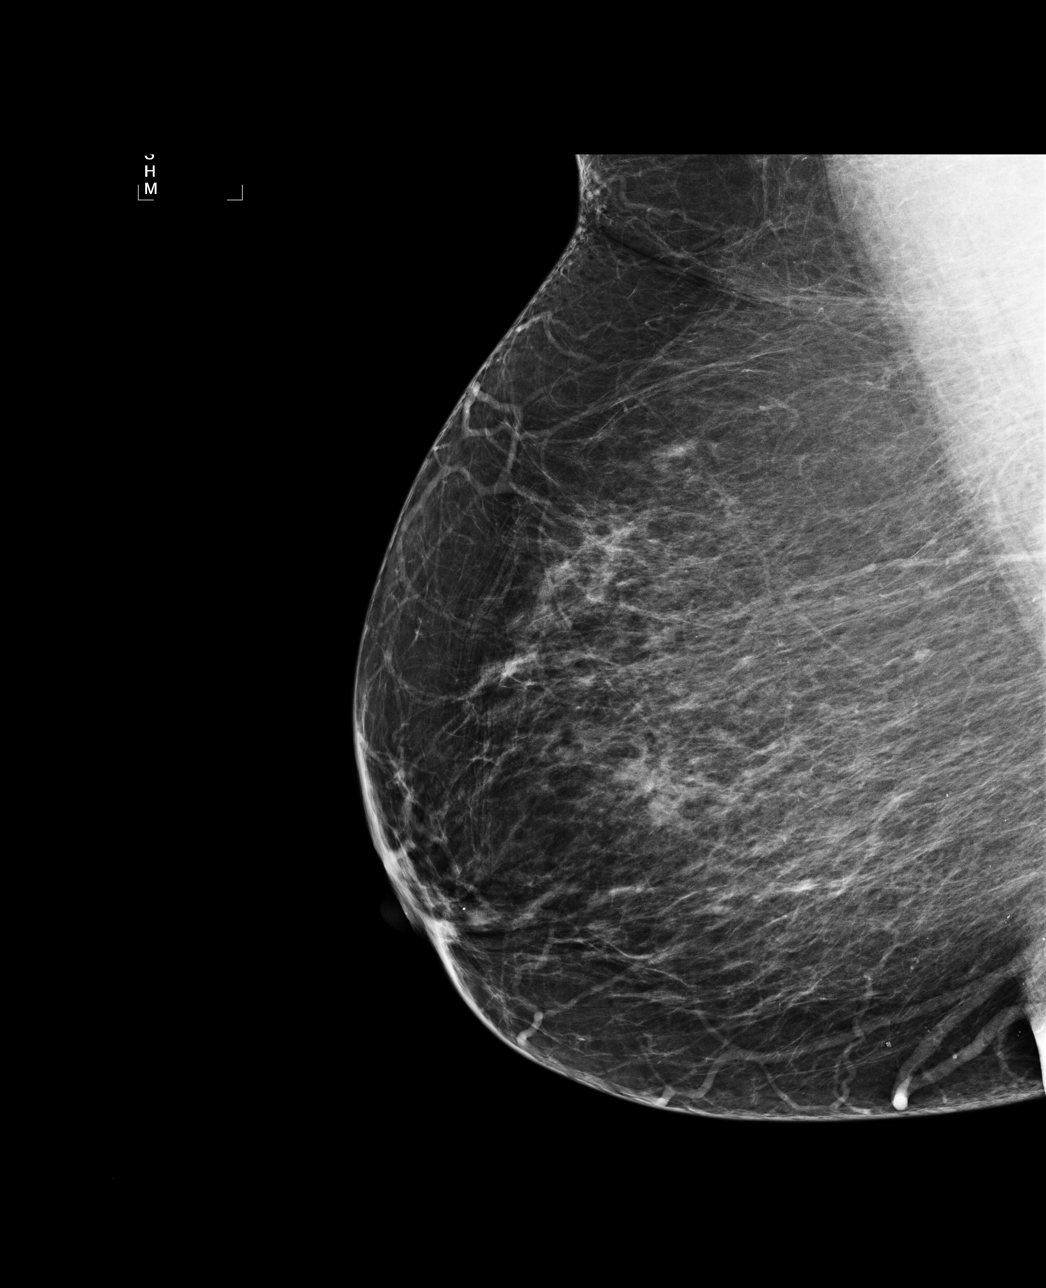

[4 of 4 positions shown; findings below may reference images not displayed]

IMPRESSION: Possible mass, right breast.  Additional evaluation is indicated.  The patient will be contacted 
for additional studies and a supplementary report will follow.  No specific mammographic evidence 
of malignancy, left breast.

ASSESSMENT: Need additional imaging evaluation and/or prior mammograms for comparison - BI-RADS 0

Further imaging of the right breast.
ANALYZED BY COMPUTER AIDED DETECTION. , THIS PROCEDURE WAS A DIGITAL MAMMOGRAM.

## 2008-12-09 ENCOUNTER — Encounter: Admission: RE | Admit: 2008-12-09 | Discharge: 2008-12-09 | Payer: Self-pay | Admitting: Internal Medicine

## 2008-12-09 IMAGING — MG MM DIAGNOSTIC LTD RIGHT
3 series · 3 of 3 positions shown · non-contrast
Comparison: [DATE] from [HOSPITAL]

CLINICAL DATA: The patient returns for evaluation of a possible
mass in the right breast noted on recent screening study dated
[DATE].

DIGITAL DIAGNOSTIC RIGHT LIMITED MAMMOGRAM

[R CC (1 of 2)]
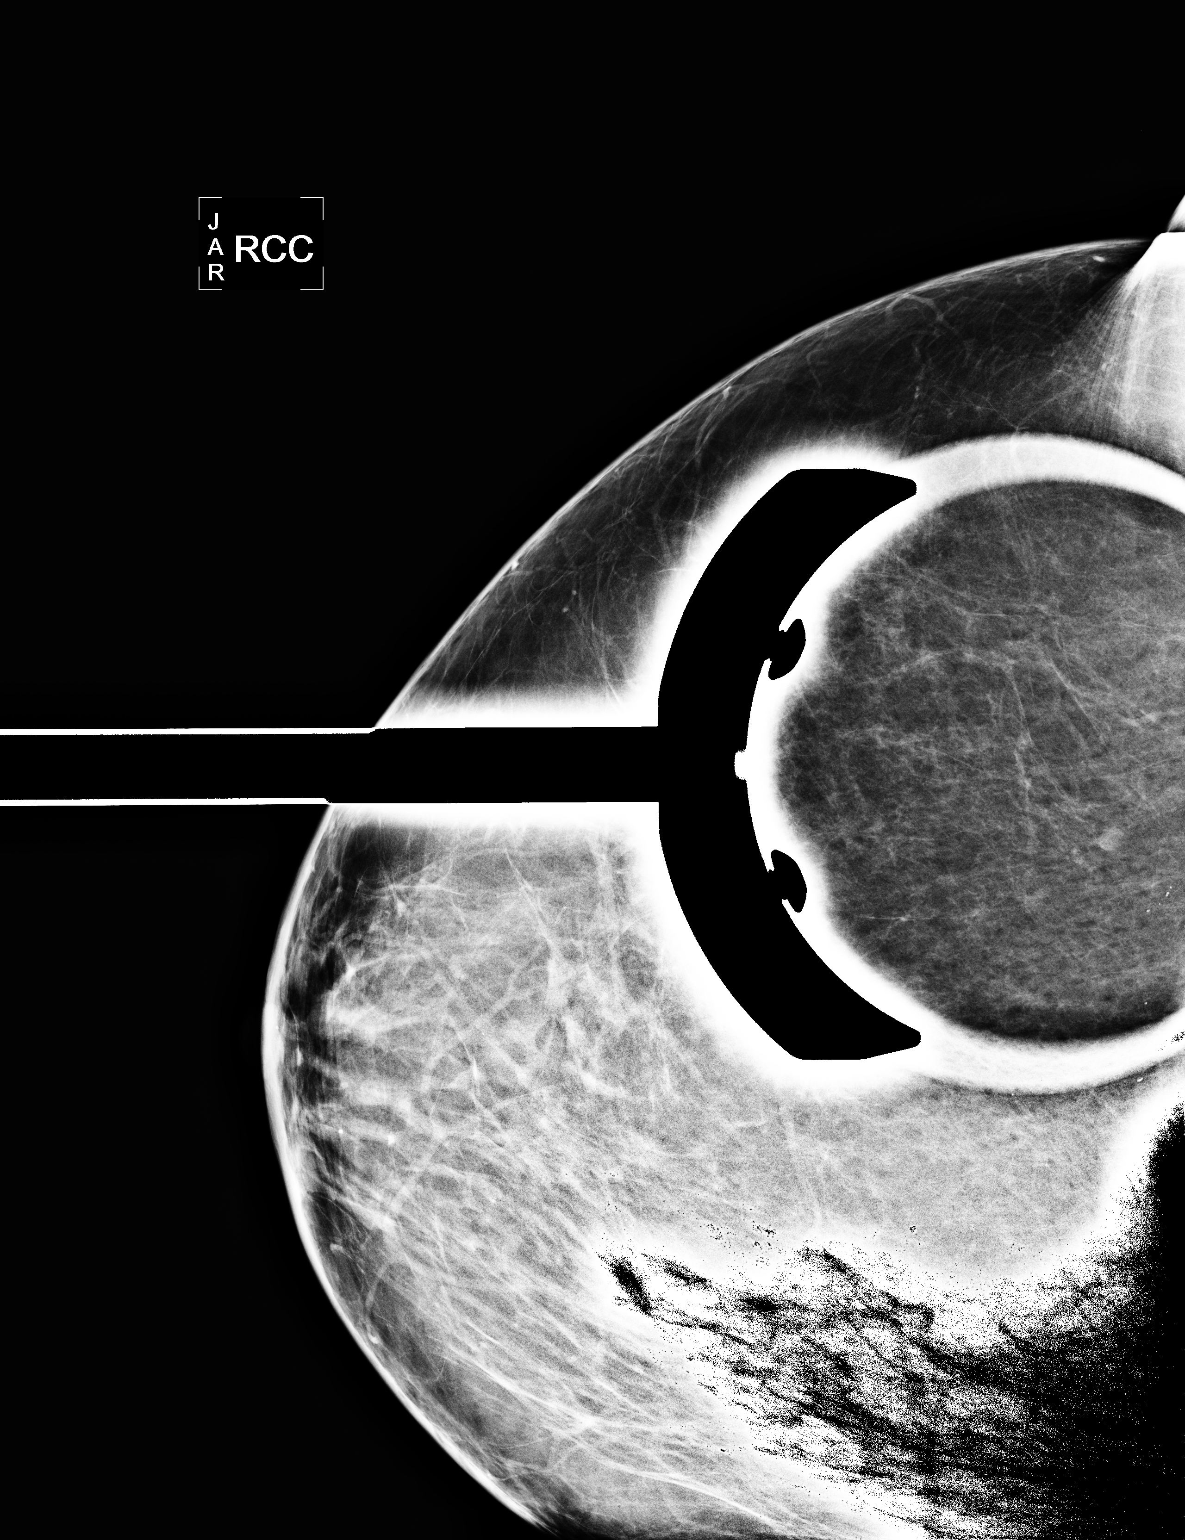

[R CC (2 of 2)]
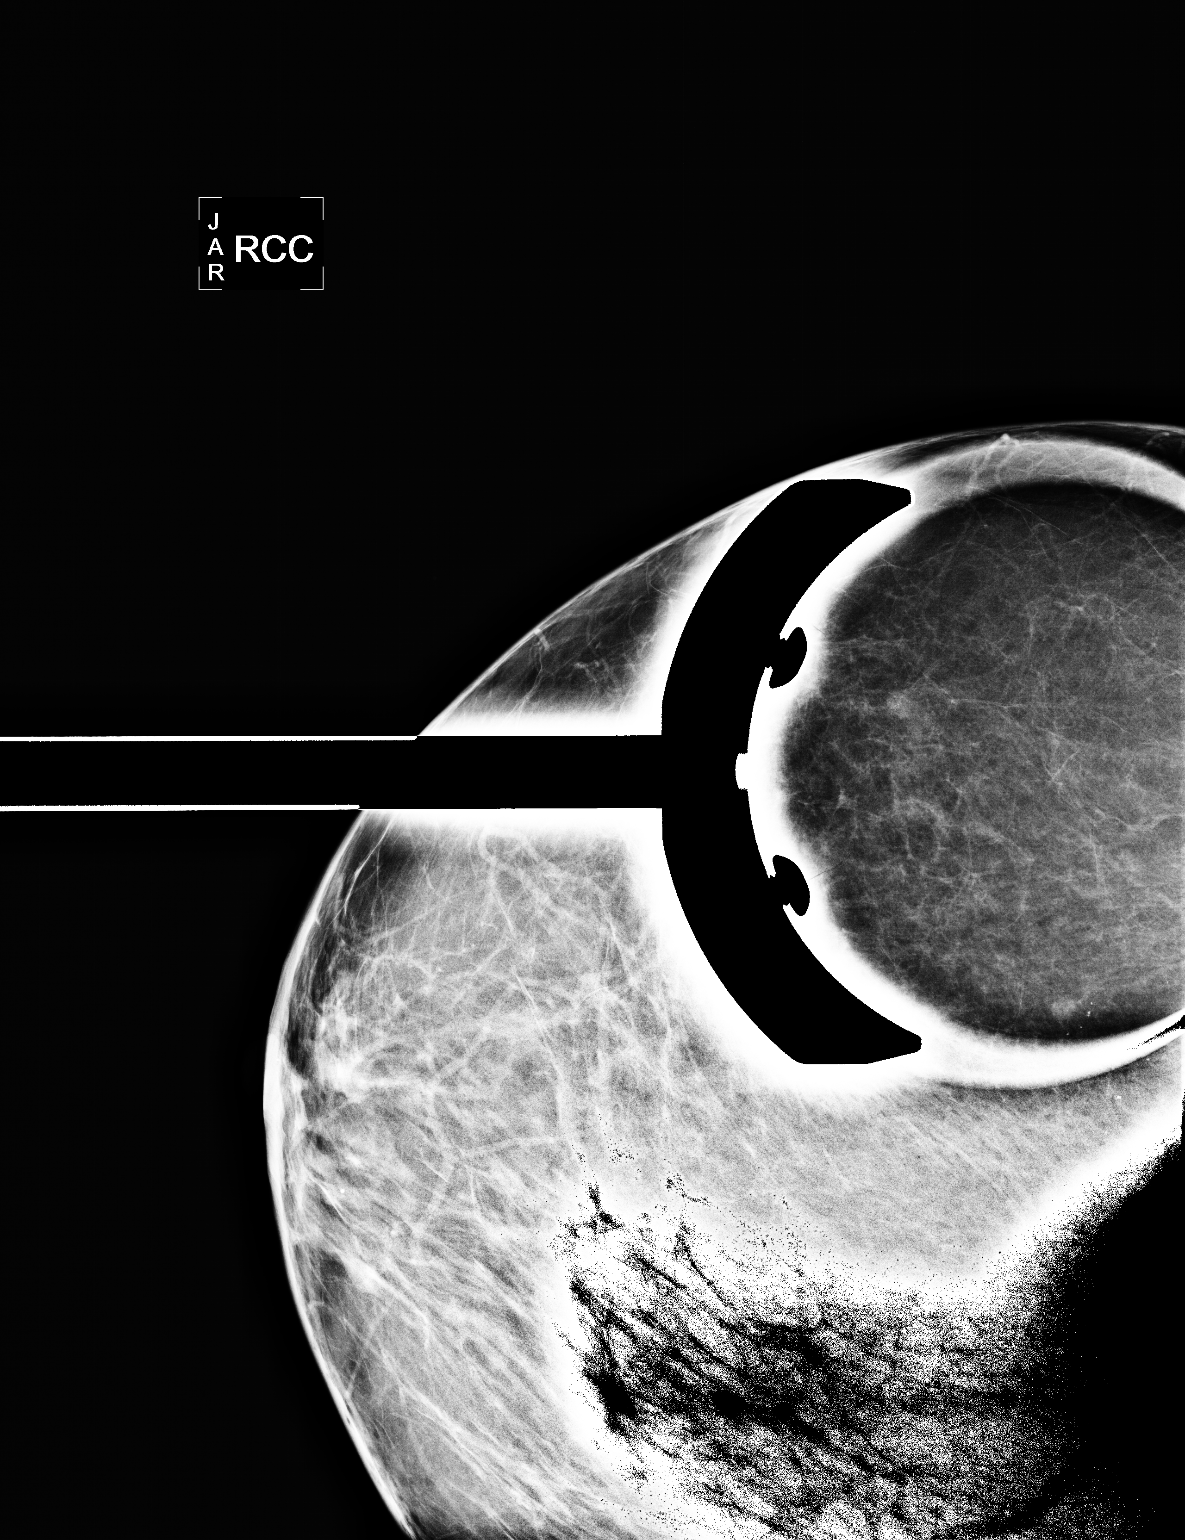

[R MLO]
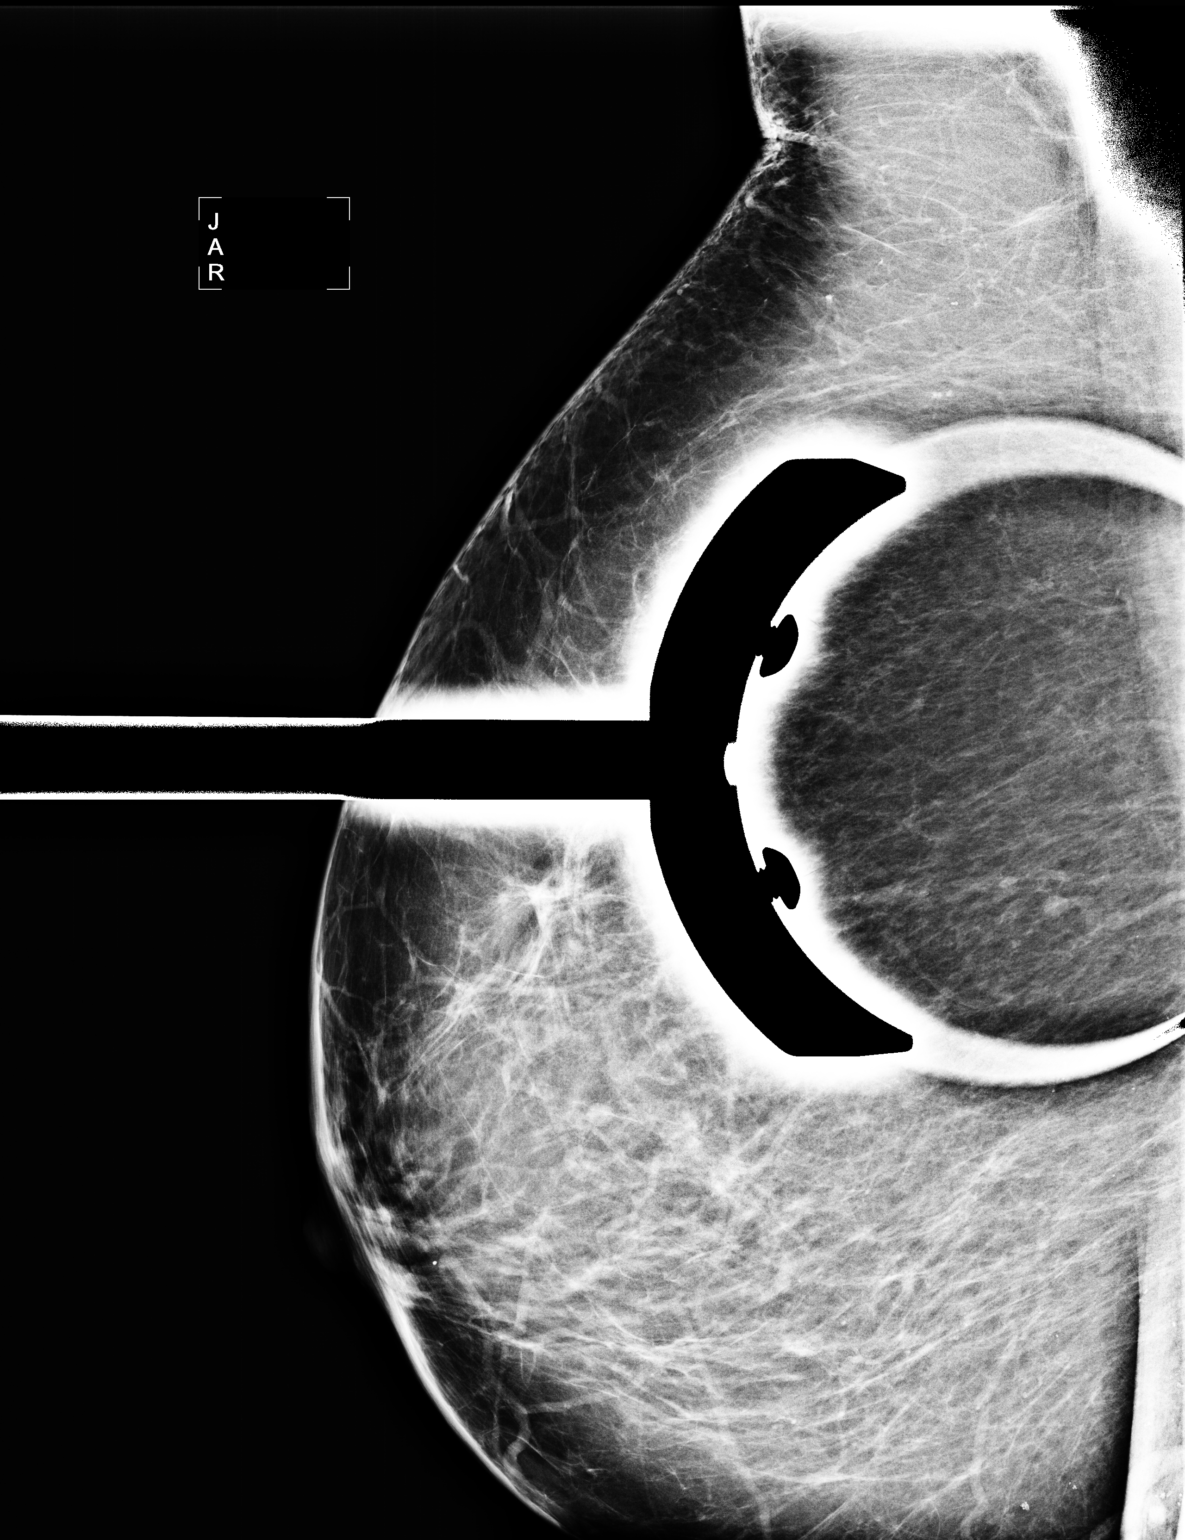

[3 of 3 positions shown; findings below may reference images not displayed]

FINDINGS: Spot compression views demonstrate an intramammary lymph
node in the right upper outer quadrant posteriorly.
IMPRESSION: The mammographic finding corresponds to an intramammary lymph node.
No mammographic evidence malignancy.  Yearly screening mammography
is suggested.

BI-RADS CATEGORY 2:  Benign finding(s).

REF:B1 DICTATED: [DATE] [DATE]

## 2009-01-05 ENCOUNTER — Ambulatory Visit: Payer: Self-pay | Admitting: Internal Medicine

## 2009-01-20 ENCOUNTER — Emergency Department (HOSPITAL_COMMUNITY): Admission: EM | Admit: 2009-01-20 | Discharge: 2009-01-20 | Payer: Self-pay | Admitting: Emergency Medicine

## 2009-01-20 IMAGING — CT CT PELVIS W/O CM
2 of 3 series · 16 of 43 positions shown, 18 images · non-contrast
Comparison: CT abdomen and pelvis with contrast [DATE].

CT ABDOMEN

CLINICAL DATA: Bladder infection.  Pain.  Left flank and left
lower quadrant pain.  Fever to 101.

CT ABDOMEN AND PELVIS WITHOUT CONTRAST
TECHNIQUE: Multidetector CT imaging of the abdomen and pelvis was
performed following the standard protocol without intravenous
contrast.

[Series 4: lung windows · axial · 0.95mm/px · z∈[-139,-19]mm · 13 of 29 slices shown, 15 images]
[im 3/29  soft-tissue]
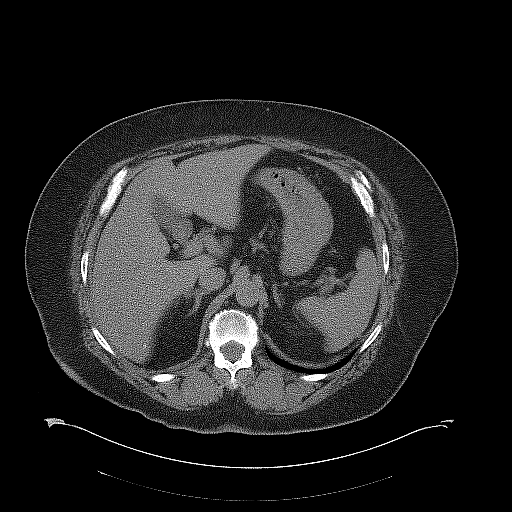
[im 3/29  bone]
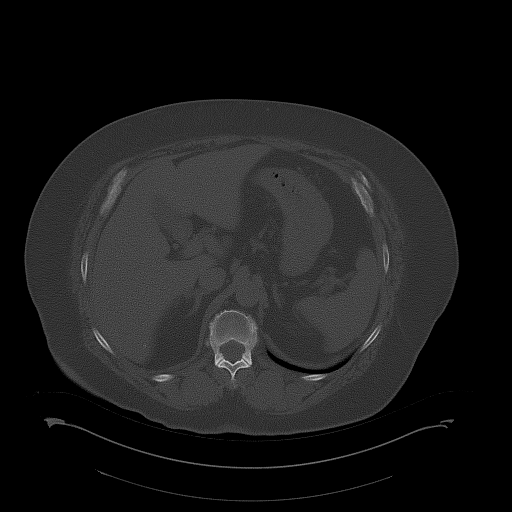
[im 5/29  soft-tissue]
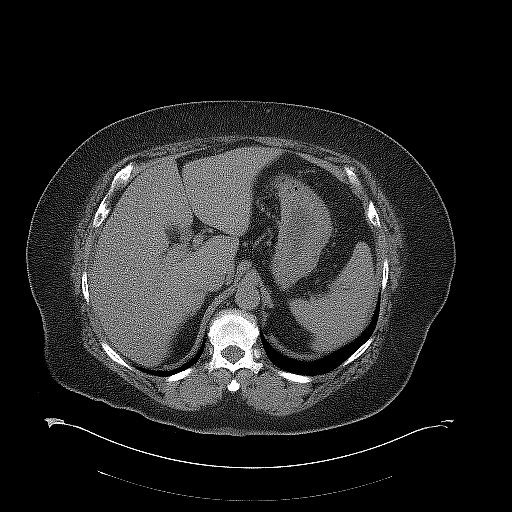
[im 7/29  soft-tissue]
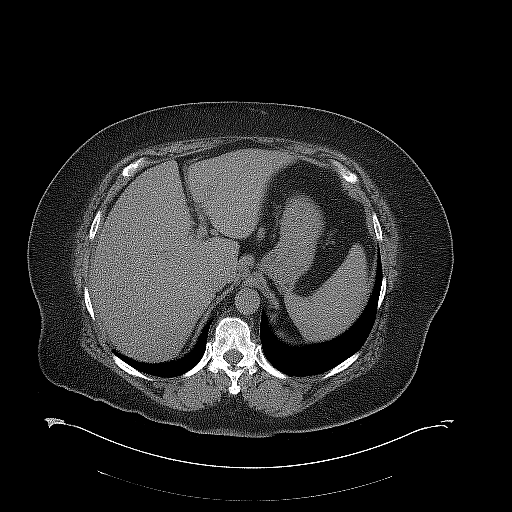
[im 9/29  soft-tissue]
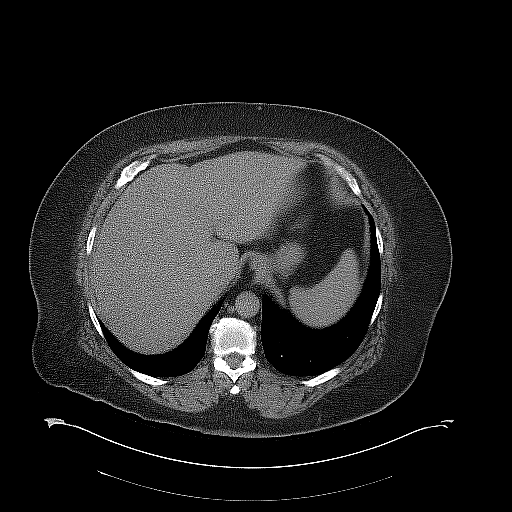
[im 11/29  soft-tissue]
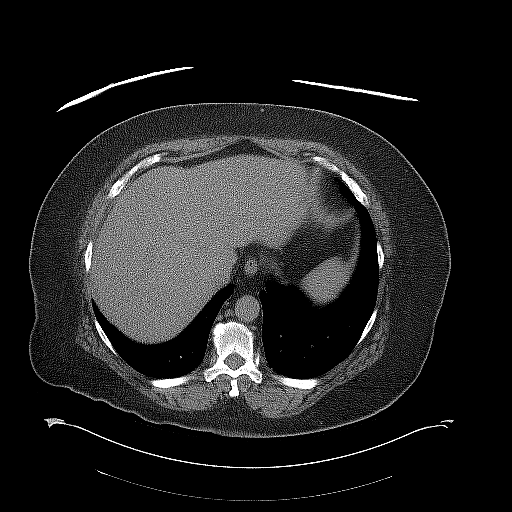
[im 13/29  soft-tissue]
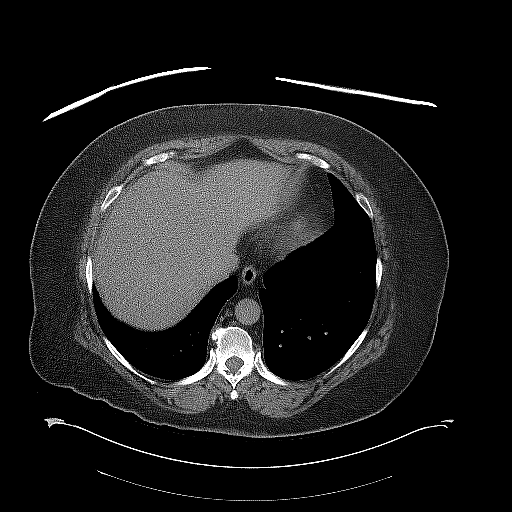
[im 15/29  soft-tissue]
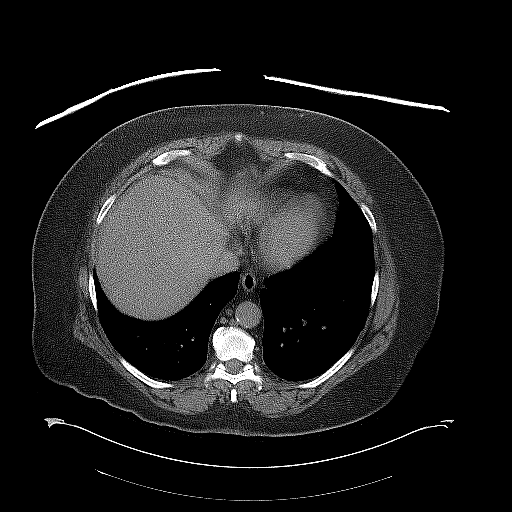
[im 17/29  soft-tissue]
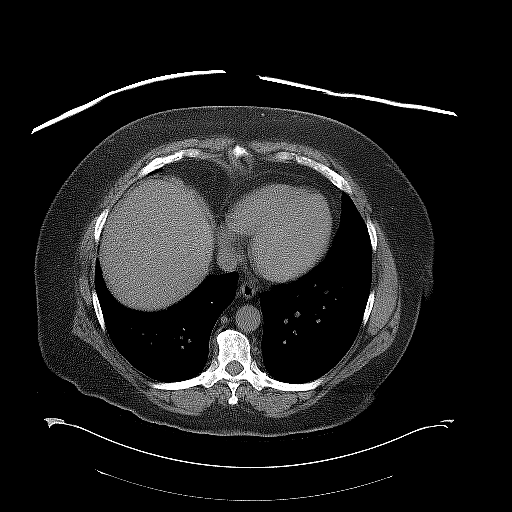
[im 19/29  soft-tissue]
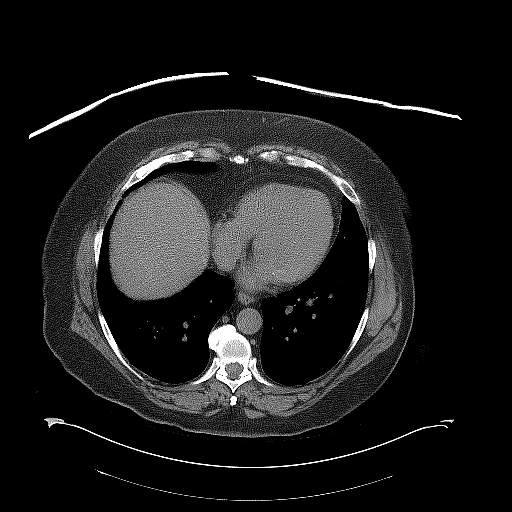
[im 19/29  bone]
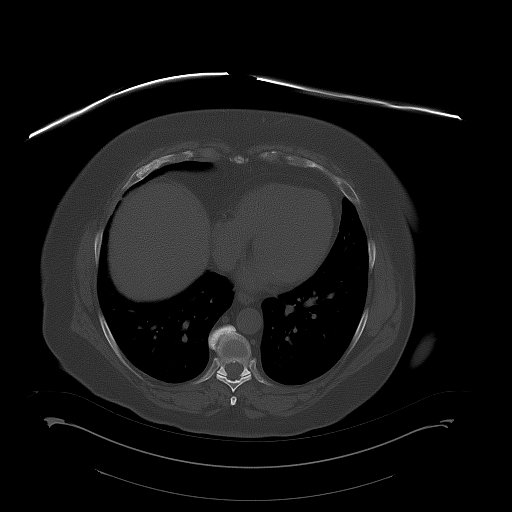
[im 21/29  soft-tissue]
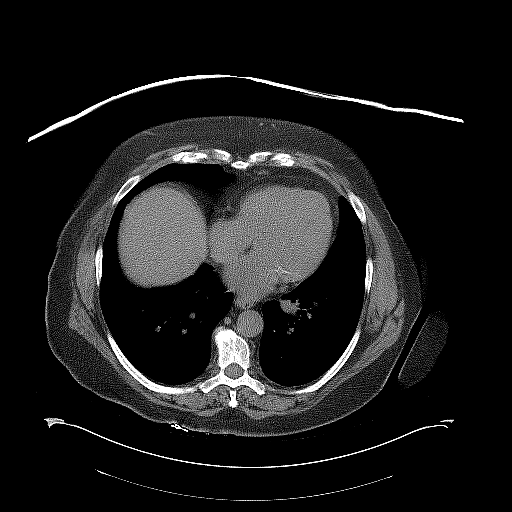
[im 23/29  soft-tissue]
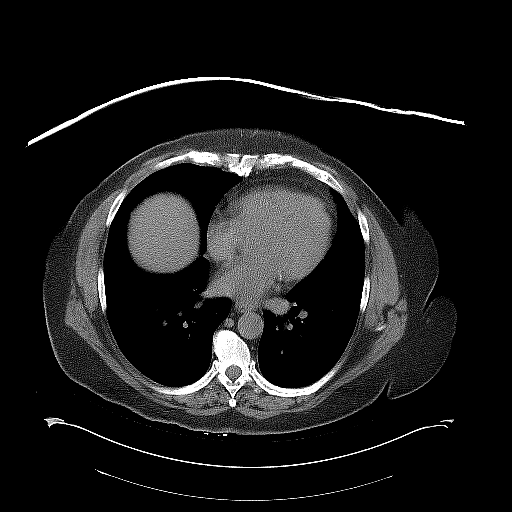
[im 25/29  soft-tissue]
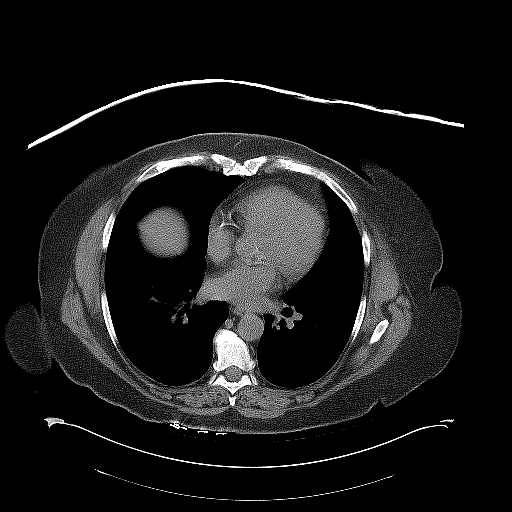
[im 27/29  soft-tissue]
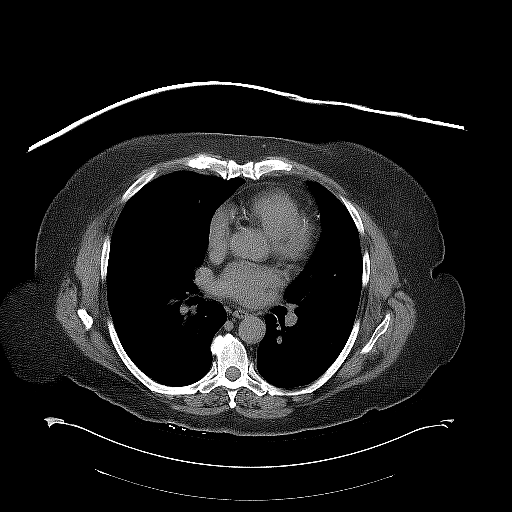

[Series 602: coronal images · coronal · 0.98mm/px · 3 of 121 slices shown]
[im 41/121  soft-tissue]
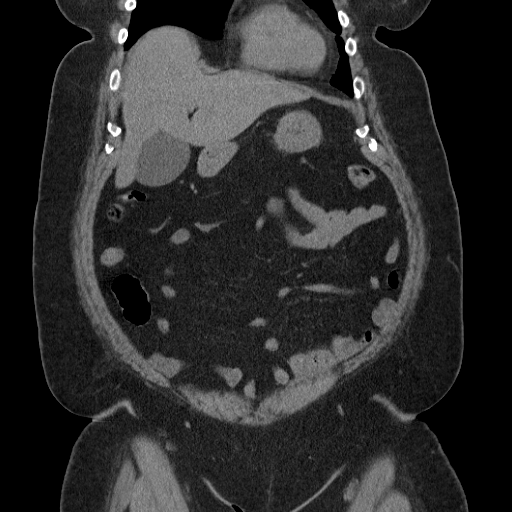
[im 54/121  soft-tissue]
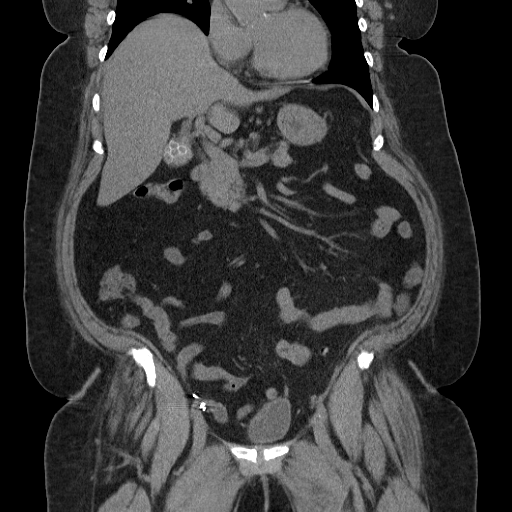
[im 67/121  soft-tissue]
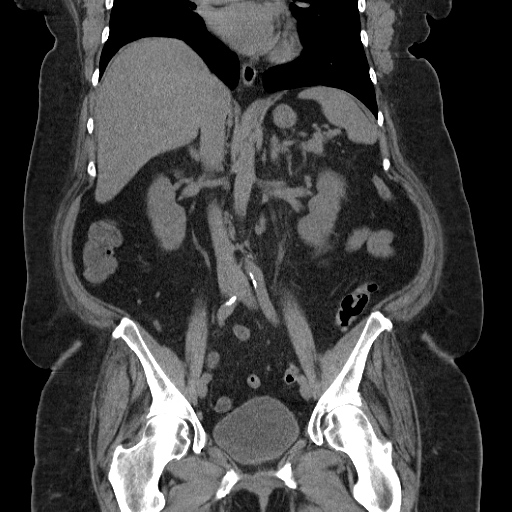

[16 of 43 positions shown; findings below may reference images not displayed]

FINDINGS: The lung bases are clear without focal nodule, mass, or
airspace disease.  Atherosclerotic calcifications are noted within
the coronary arteries.  The heart size is normal.  There is no
significant pleural or pericardial effusion.

The liver and spleen are unremarkable.  The stomach, pancreas, and
common bile duct are within normal limits.  Multiple gallstones are
seen dependently within the gallbladder.  There is no inflammation
to suggest cholecystitis.  The adrenal glands are within normal
limits bilaterally.  The right kidney is unremarkable.
Inflammatory changes are noted about the left kidney without
hydronephrosis.  There is some inflammation about the proximal
ureter.  The distal ureter is diverted and attached at the dome of
the bladder on the left.  On the previous CT scan, there is normal
insertion of the distal ureter.  There was involvement of the
ureter with sigmoid diverticulitis.  Note is made of fat containing
periumbilical hernias.  There is no involvement of bowel.  Small
bowel is unremarkable. Bone windows demonstrate mild degenerative
change.
IMPRESSION: 1.  Inflammatory changes about the left kidney without obstruction.
This may represent pyelonephritis.
2.  Diversion of the left distal ureter to the dome of the bladder.
3.  Para umbilical hernias containing fat.
4.  Atherosclerosis of the coronary arteries.
5.  Cholelithiasis without cholecystitis.

CT PELVIS
FINDINGS: The patient is status post partial resection of the
sigmoid colon.  Diverticular changes are noted in the distal
descending and proximal sigmoid colon without inflammation to
suggest diverticulitis.  The remainder of the colon is
unremarkable.  The appendix is visualized and within normal limits.
The terminal ileum is normal.  The patient is status post
hysterectomy.  The ovaries are not clearly seen.  Bone windows are
unremarkable. The urinary bladder is unremarkable on noncontrast
imaging.
IMPRESSION: 1.  Postoperative changes of partial resection of the sigmoid
colon.
2.  Distal descending and proximal sigmoid colonic diverticulosis
without diverticulitis.
3.  Status post hysterectomy.
4.  No active disease of the pelvis.

## 2009-01-26 ENCOUNTER — Ambulatory Visit: Payer: Self-pay | Admitting: Internal Medicine

## 2009-02-02 ENCOUNTER — Telehealth: Payer: Self-pay | Admitting: Internal Medicine

## 2009-02-04 ENCOUNTER — Encounter: Payer: Self-pay | Admitting: Internal Medicine

## 2009-03-09 ENCOUNTER — Ambulatory Visit: Payer: Self-pay | Admitting: Internal Medicine

## 2009-03-09 DIAGNOSIS — R300 Dysuria: Secondary | ICD-10-CM | POA: Insufficient documentation

## 2009-04-13 ENCOUNTER — Telehealth: Payer: Self-pay | Admitting: Family Medicine

## 2009-04-13 ENCOUNTER — Ambulatory Visit: Payer: Self-pay | Admitting: Internal Medicine

## 2009-04-14 ENCOUNTER — Inpatient Hospital Stay (HOSPITAL_COMMUNITY): Admission: EM | Admit: 2009-04-14 | Discharge: 2009-04-16 | Payer: Self-pay | Admitting: Emergency Medicine

## 2009-04-14 IMAGING — CT CT ABDOMEN W/O CM
2 of 4 series · 17 of 46 positions shown, 19 images · non-contrast
Comparison: [DATE]

CT ABDOMEN

CLINICAL DATA: Fever/dysuria/left abdominal pain/renal
insufficiency

CT ABDOMEN AND PELVIS WITHOUT CONTRAST
TECHNIQUE: Multidetector CT imaging of the abdomen and pelvis was
performed following the standard protocol without intravenous
contrast.

[Series 2: rtn a/p with xxl · axial · 0.92mm/px · z∈[-496,+24]mm · 14 of 114 slices shown, 16 images]
[im 5/114  soft-tissue]
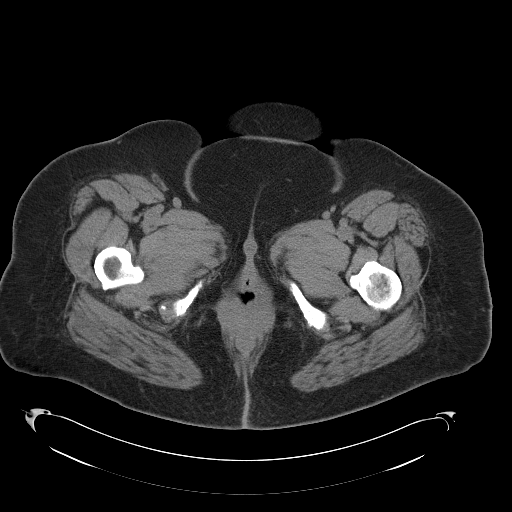
[im 5/114  bone]
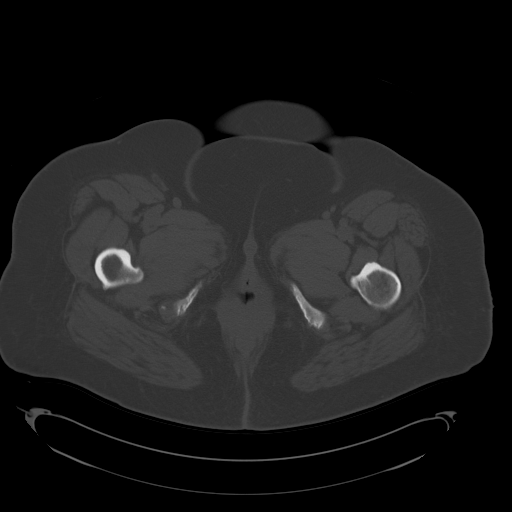
[im 15/114  soft-tissue]
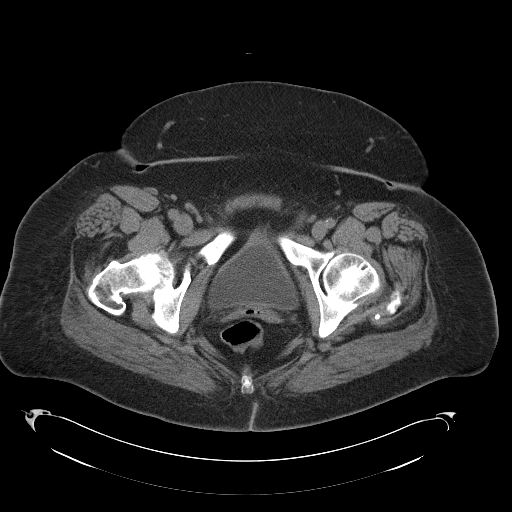
[im 20/114  soft-tissue]
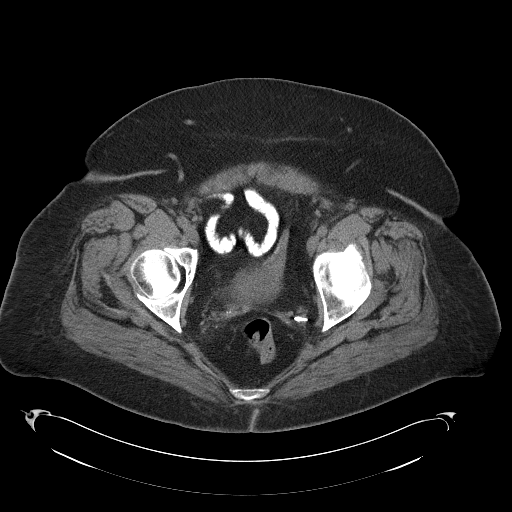
[im 30/114  soft-tissue]
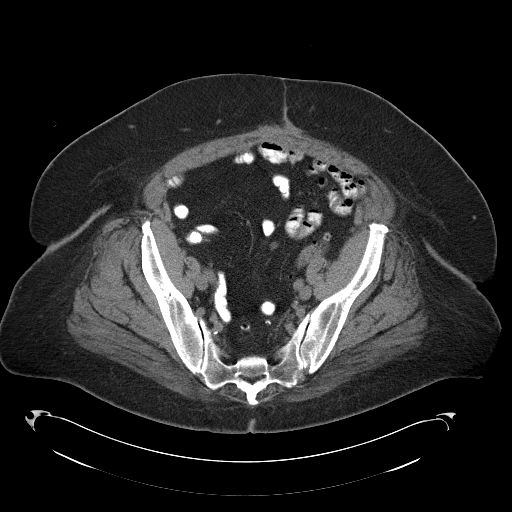
[im 40/114  soft-tissue]
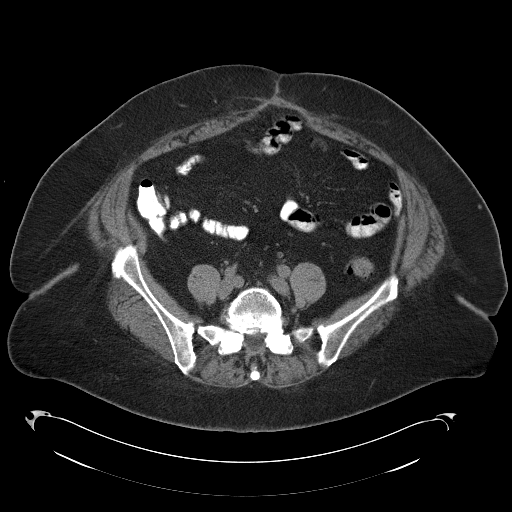
[im 45/114  soft-tissue]
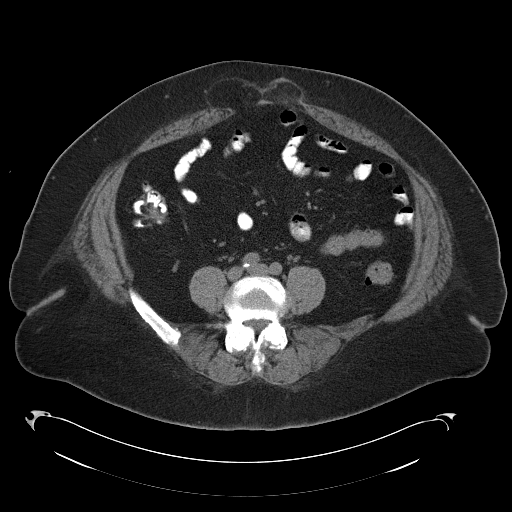
[im 55/114  soft-tissue]
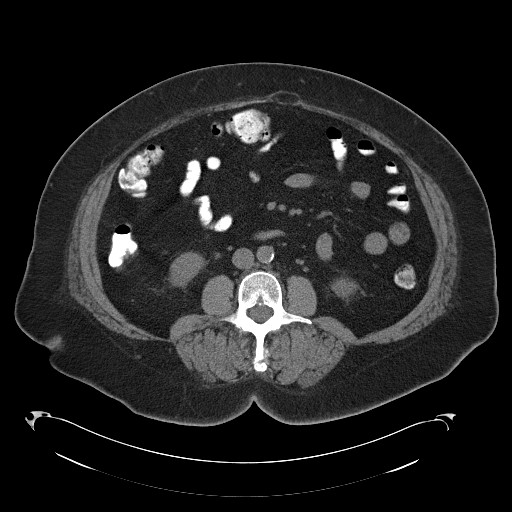
[im 59/114  soft-tissue]
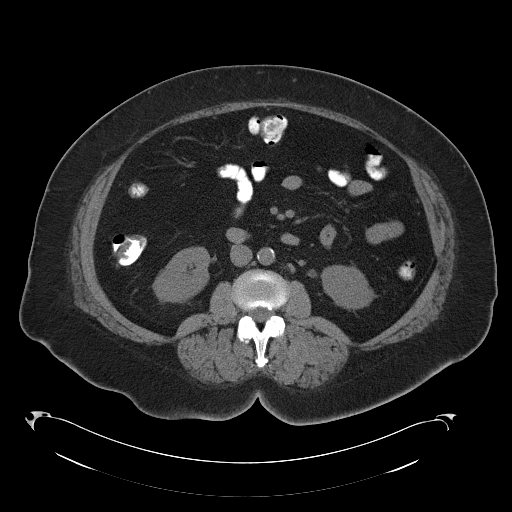
[im 69/114  soft-tissue]
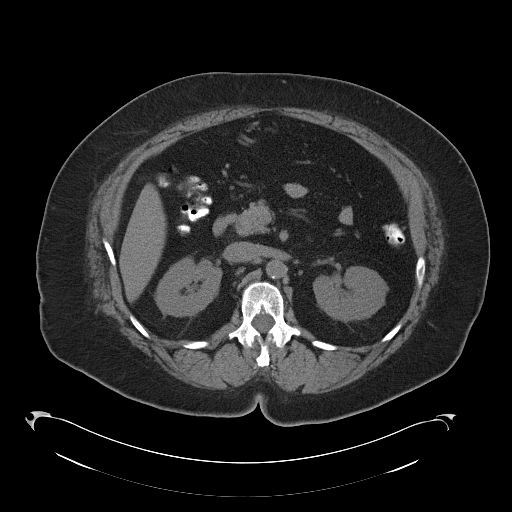
[im 69/114  bone]
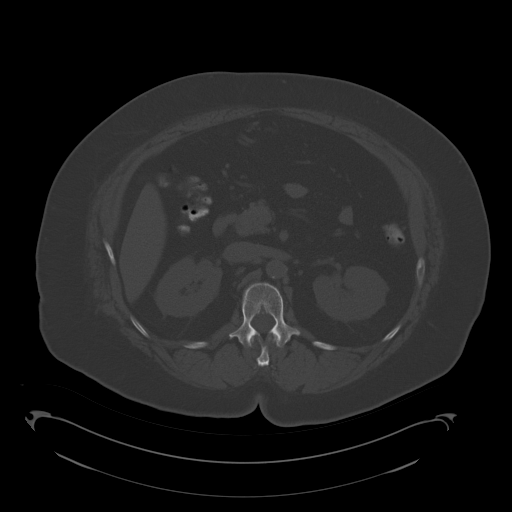
[im 74/114  soft-tissue]
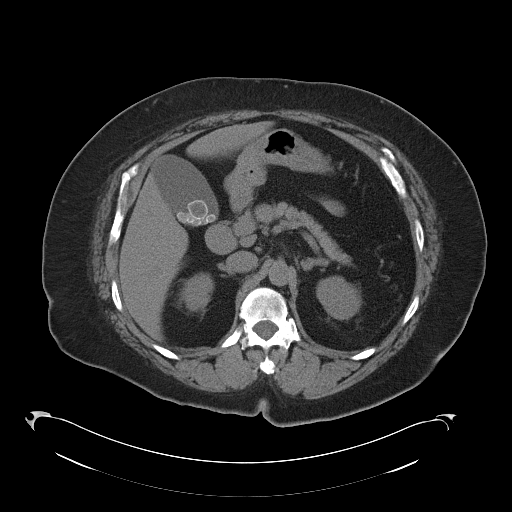
[im 84/114  soft-tissue]
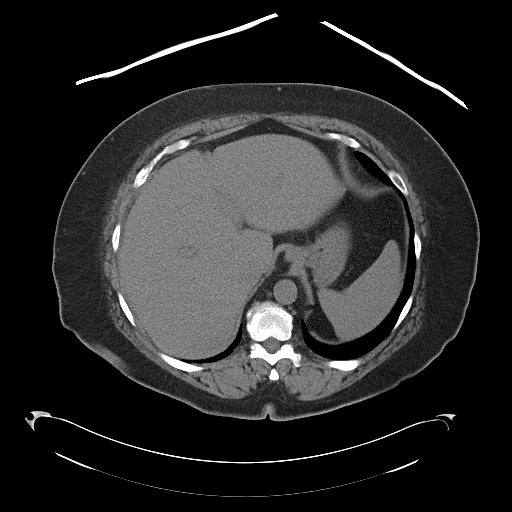
[im 94/114  soft-tissue]
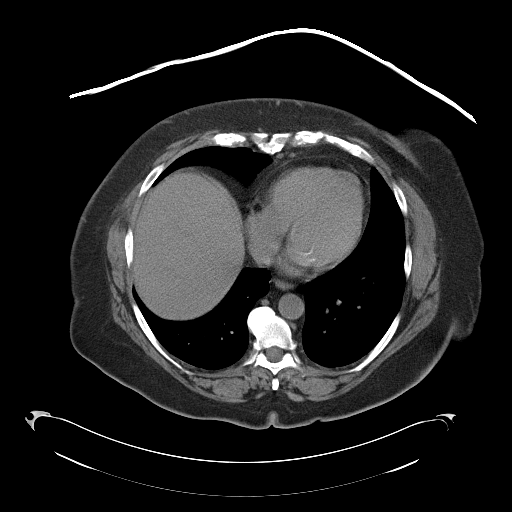
[im 99/114  soft-tissue]
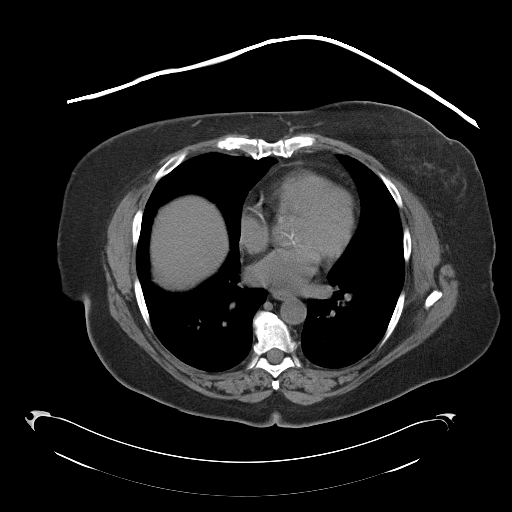
[im 109/114  soft-tissue]
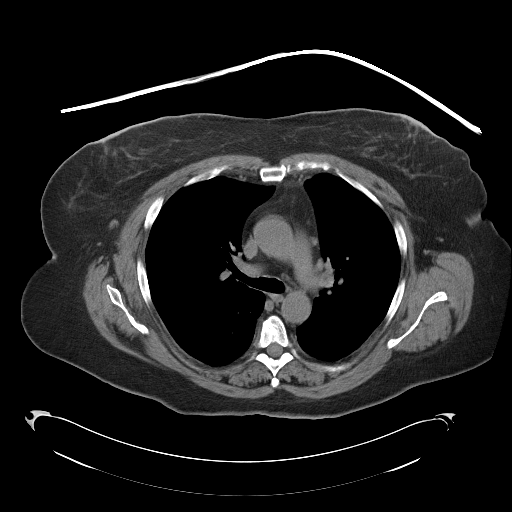

[Series 602: coronal images · coronal · 1.11mm/px · 3 of 112 slices shown]
[im 38/112  soft-tissue]
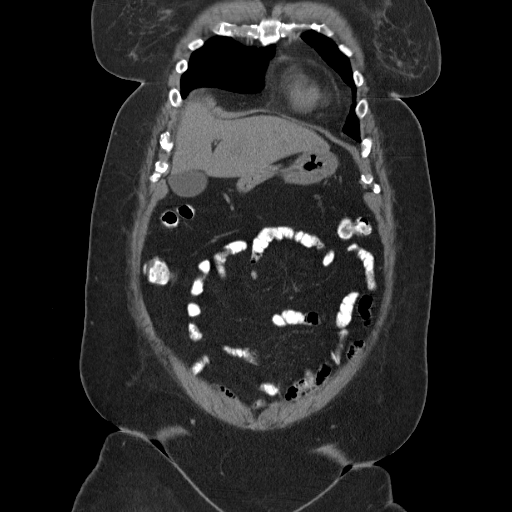
[im 50/112  soft-tissue]
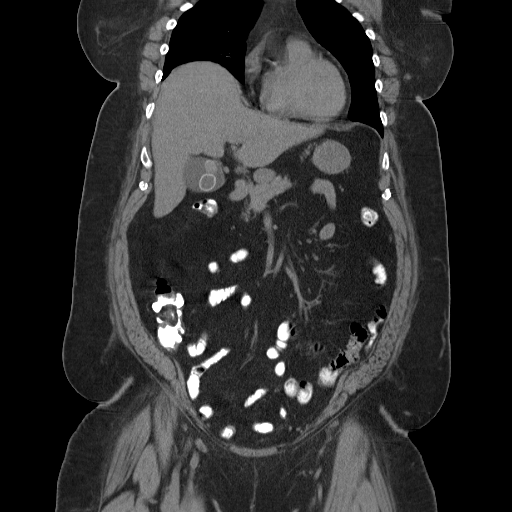
[im 62/112  soft-tissue]
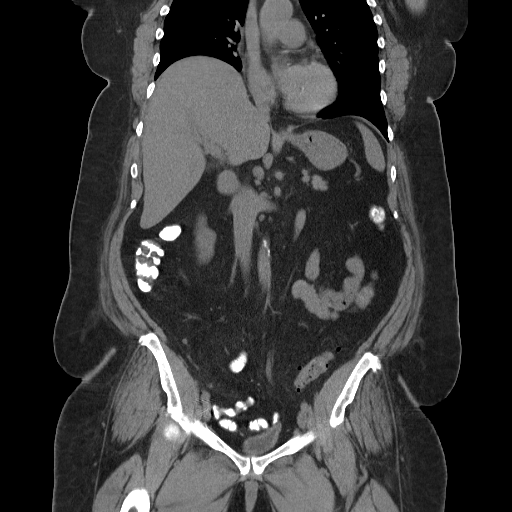

[17 of 46 positions shown; findings below may reference images not displayed]

FINDINGS: The lung bases are clear.

In the unenhanced state, there are no focal lesions of the liver or
spleen identified.  Pancreas and adrenal glands normal.

Again noted are numerous calcified gallstones without gallbladder
wall thickening.  No adenopathy or ascites.

No hydronephrosis or hydroureter.  No renal calculi or focal
masses.  No significant perinephric stranding as was noted on the
prior study. There is aortoiliac calcification and coronary artery
calcification as before.

Again noted is a ventral hernia above the umbilicus.
IMPRESSION: 1.  No renal calculi or hydronephrosis.
2.  The left perinephric stranding noted on the prior study, not
evident on today's exam.
3.  Cholelithiasis as before.
4.  Stable hernia above the umbilicus.
5.  Vascular calcifications as before.
6.  No obvious acute findings.

CT PELVIS
FINDINGS: No hydroureter or calcifications in the ureter.
Postsurgical changes are again noted with the left ureter implanted
into the dome of the bladder.  The ureter may be slightly more
dilated than before but it is not to significantly dilated.

No other acute or significant findings.  Postsurgical changes are
noted in the left hemi pelvis.  No masses or fluid collections.
Visualized bowel unremarkable - there are sigmoid diverticula.
IMPRESSION: 1.  Postsurgical changes as above - no acute findings.

2.  The surgically re-implanted left ureter is slightly more
prominent than before, but does not appear to be significantly
dilated.  See report.

## 2009-04-14 IMAGING — CR DG CHEST 2V
2 series · 2 of 2 positions shown · non-contrast
Comparison: [DATE]

CLINICAL DATA: Fever

CHEST - 2 VIEW

[w chest pa]
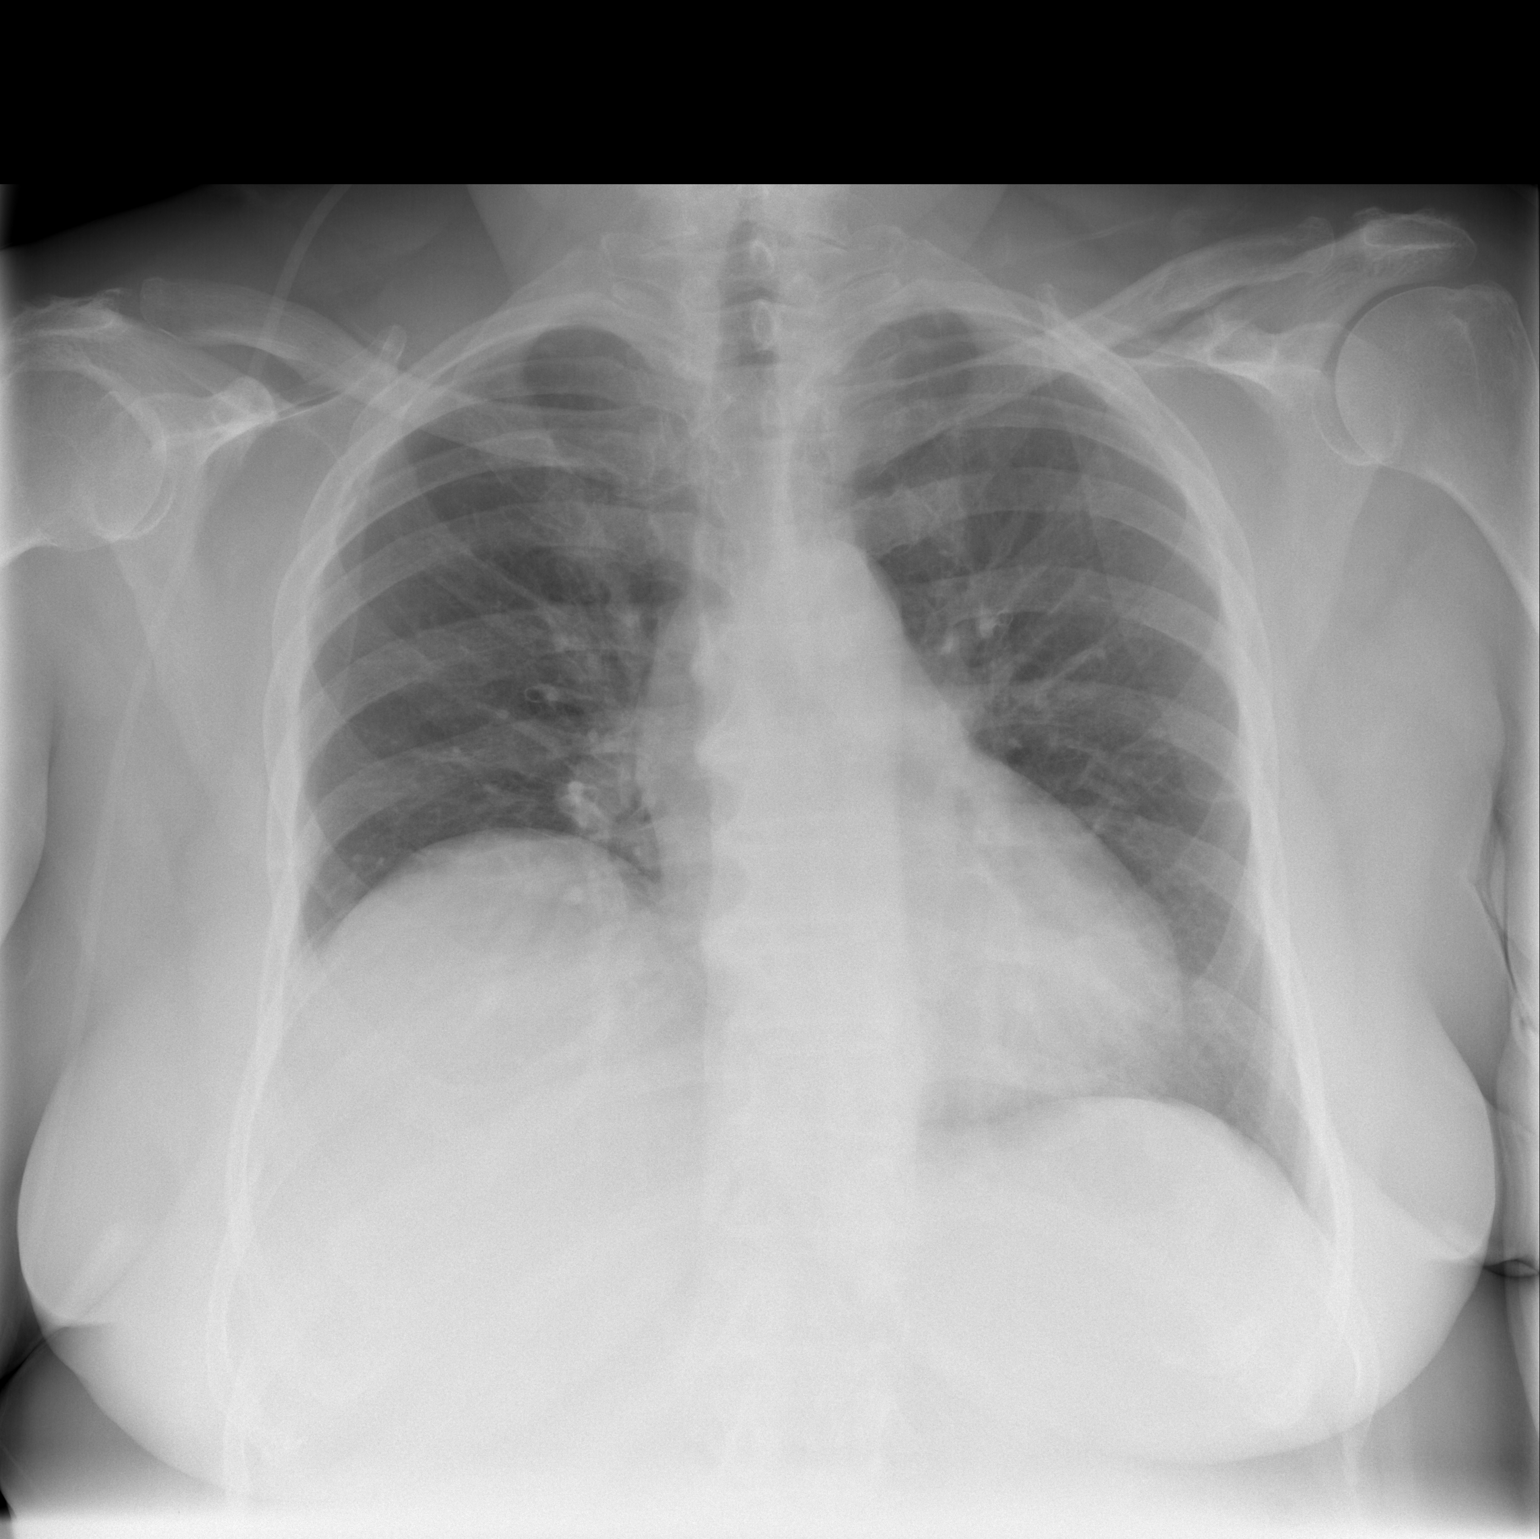

[w chest lat]
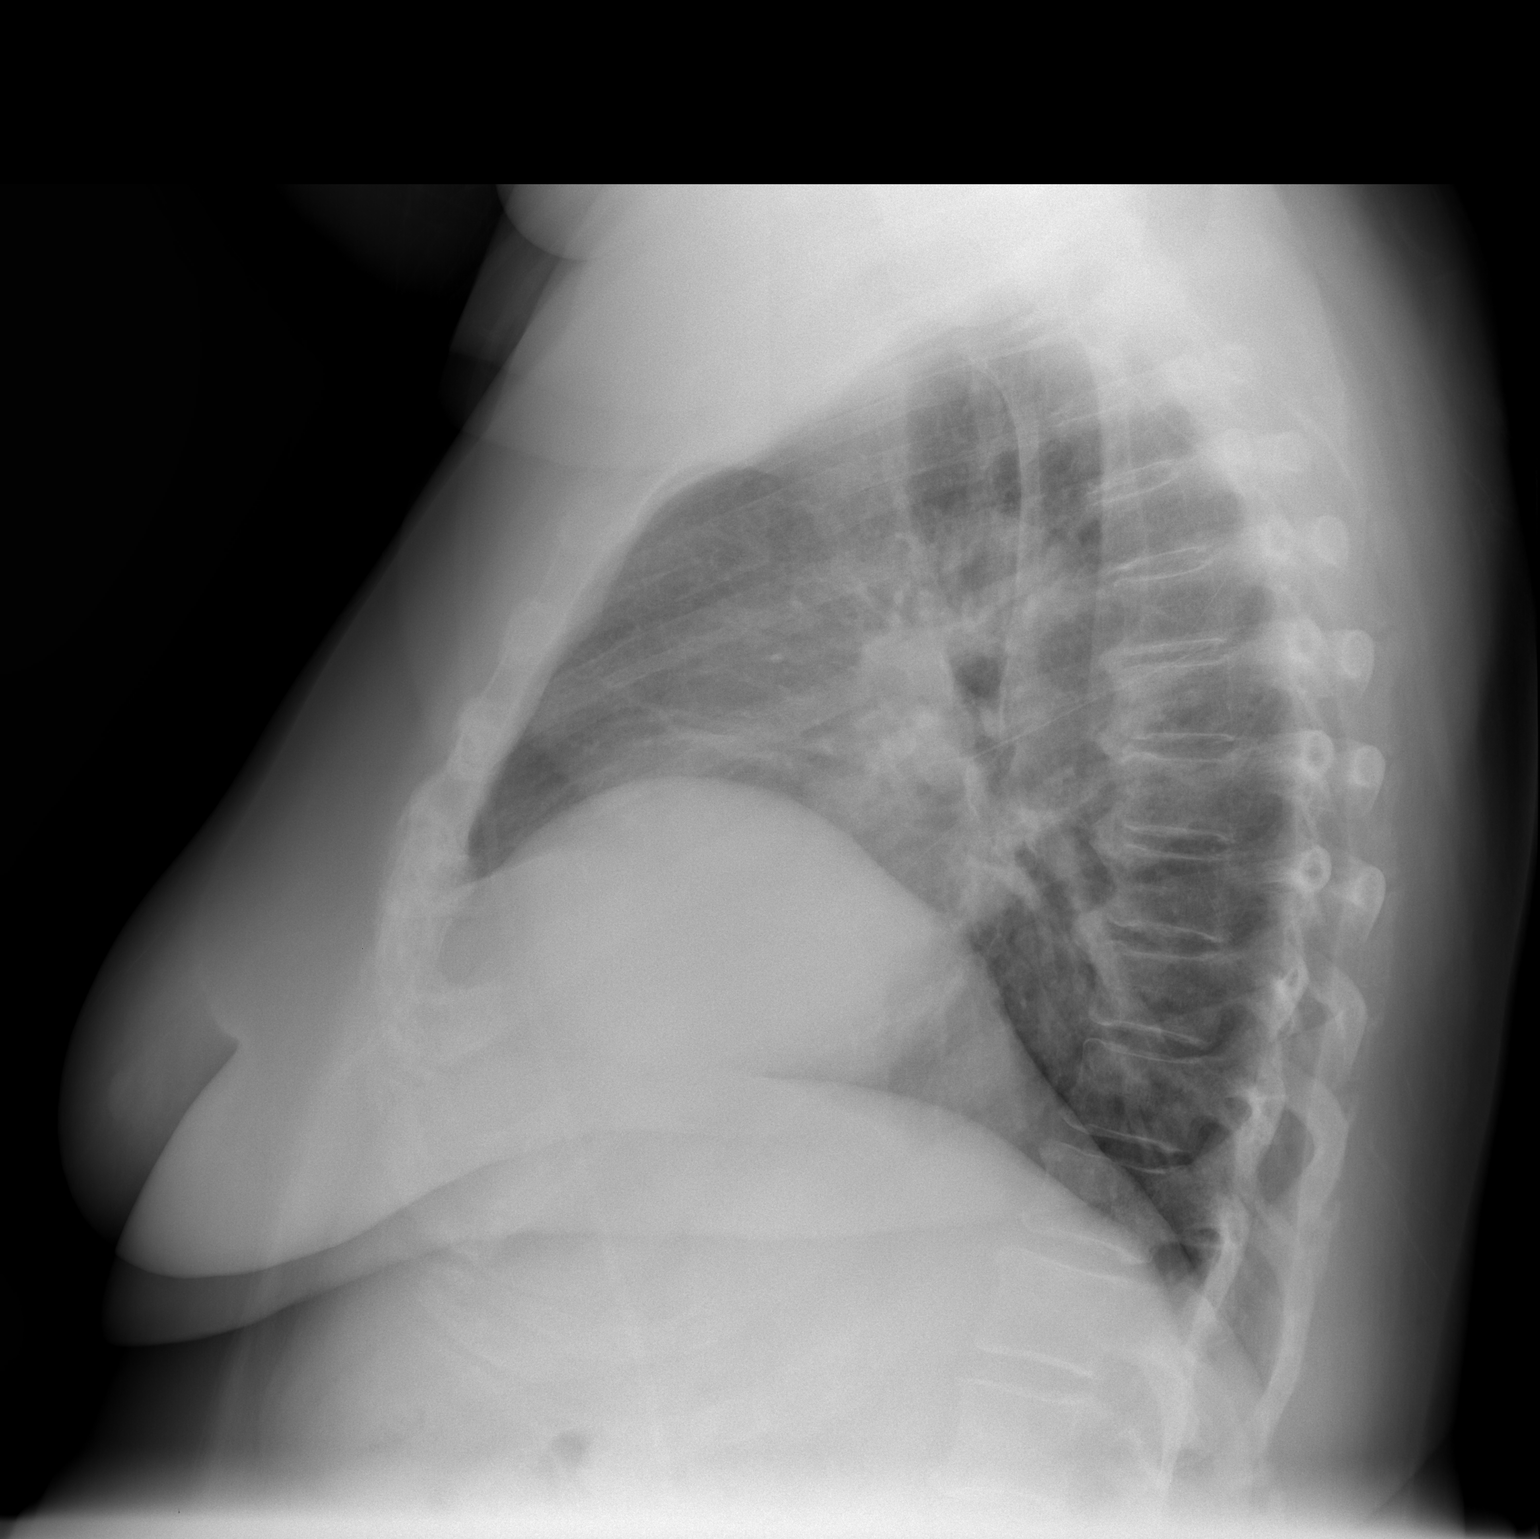

[2 of 2 positions shown; findings below may reference images not displayed]

FINDINGS: Heart and mediastinal contours normal.  The right
hemidiaphragm is elevated but unchanged.  Lungs clear.  Mild
peribronchial thickening.  No pleural fluid or osseous lesions.
IMPRESSION: Chronically elevated or eventrated right hemidiaphragm - no active
disease.

## 2009-06-15 ENCOUNTER — Ambulatory Visit: Payer: Self-pay | Admitting: Internal Medicine

## 2009-06-15 LAB — CONVERTED CEMR LAB
ALT: 25 units/L (ref 0–35)
AST: 32 units/L (ref 0–37)
Albumin: 3.7 g/dL (ref 3.5–5.2)
Alkaline Phosphatase: 19 units/L — ABNORMAL LOW (ref 39–117)
Bilirubin, Direct: 0 mg/dL (ref 0.0–0.3)
Cholesterol: 225 mg/dL — ABNORMAL HIGH (ref 0–200)
Direct LDL: 170.2 mg/dL
HDL: 38.2 mg/dL — ABNORMAL LOW (ref >39.00)
Hgb A1c MFr Bld: 6 % (ref 4.6–6.5)
TSH: 1.67 microintl units/mL (ref 0.35–5.50)
Total Bilirubin: 0.9 mg/dL (ref 0.3–1.2)
Total Protein: 7 g/dL (ref 6.0–8.3)

## 2009-06-17 ENCOUNTER — Telehealth: Payer: Self-pay | Admitting: Internal Medicine

## 2009-06-30 ENCOUNTER — Ambulatory Visit: Payer: Self-pay | Admitting: Internal Medicine

## 2009-08-23 ENCOUNTER — Ambulatory Visit: Payer: Self-pay | Admitting: Internal Medicine

## 2009-08-25 LAB — CONVERTED CEMR LAB
Basophils Absolute: 0.1 10*3/uL (ref 0.0–0.1)
Basophils Relative: 1.2 % (ref 0.0–3.0)
CK-MB: 1.6 ng/mL (ref 0.3–4.0)
Eosinophils Absolute: 0.5 10*3/uL (ref 0.0–0.7)
Eosinophils Relative: 6.8 % — ABNORMAL HIGH (ref 0.0–5.0)
HCT: 40.7 % (ref 36.0–46.0)
Hemoglobin: 14 g/dL (ref 12.0–15.0)
Lymphocytes Relative: 31.7 % (ref 12.0–46.0)
Lymphs Abs: 2.2 10*3/uL (ref 0.7–4.0)
MCHC: 34.4 g/dL (ref 30.0–36.0)
MCV: 92.5 fL (ref 78.0–100.0)
Monocytes Absolute: 0.6 10*3/uL (ref 0.1–1.0)
Monocytes Relative: 8.2 % (ref 3.0–12.0)
Neutro Abs: 3.4 10*3/uL (ref 1.4–7.7)
Neutrophils Relative %: 52.1 % (ref 43.0–77.0)
Platelets: 269 10*3/uL (ref 150.0–400.0)
RBC: 4.4 M/uL (ref 3.87–5.11)
RDW: 12.6 % (ref 11.5–14.6)
Relative Index: 1.3 (ref 0.0–2.5)
Total CK: 123 units/L (ref 7–177)
WBC: 6.8 10*3/uL (ref 4.5–10.5)

## 2009-09-16 ENCOUNTER — Ambulatory Visit: Payer: Self-pay | Admitting: Internal Medicine

## 2009-09-16 LAB — CONVERTED CEMR LAB
Cholesterol: 230 mg/dL — ABNORMAL HIGH (ref 0–200)
Direct LDL: 175.7 mg/dL
HDL: 42.5 mg/dL (ref >39.00)

## 2009-11-12 ENCOUNTER — Ambulatory Visit: Payer: Self-pay | Admitting: Internal Medicine

## 2009-11-12 ENCOUNTER — Telehealth: Payer: Self-pay | Admitting: Internal Medicine

## 2009-11-12 IMAGING — CR DG ABDOMEN 1V
2 series · 2 of 2 positions shown · non-contrast
Comparison: CT abdomen [DATE]

CLINICAL DATA: Diverticulitis.  Abdominal pain.  Constipation.

ABDOMEN - 1 VIEW

[view not recorded (1 of 2)]
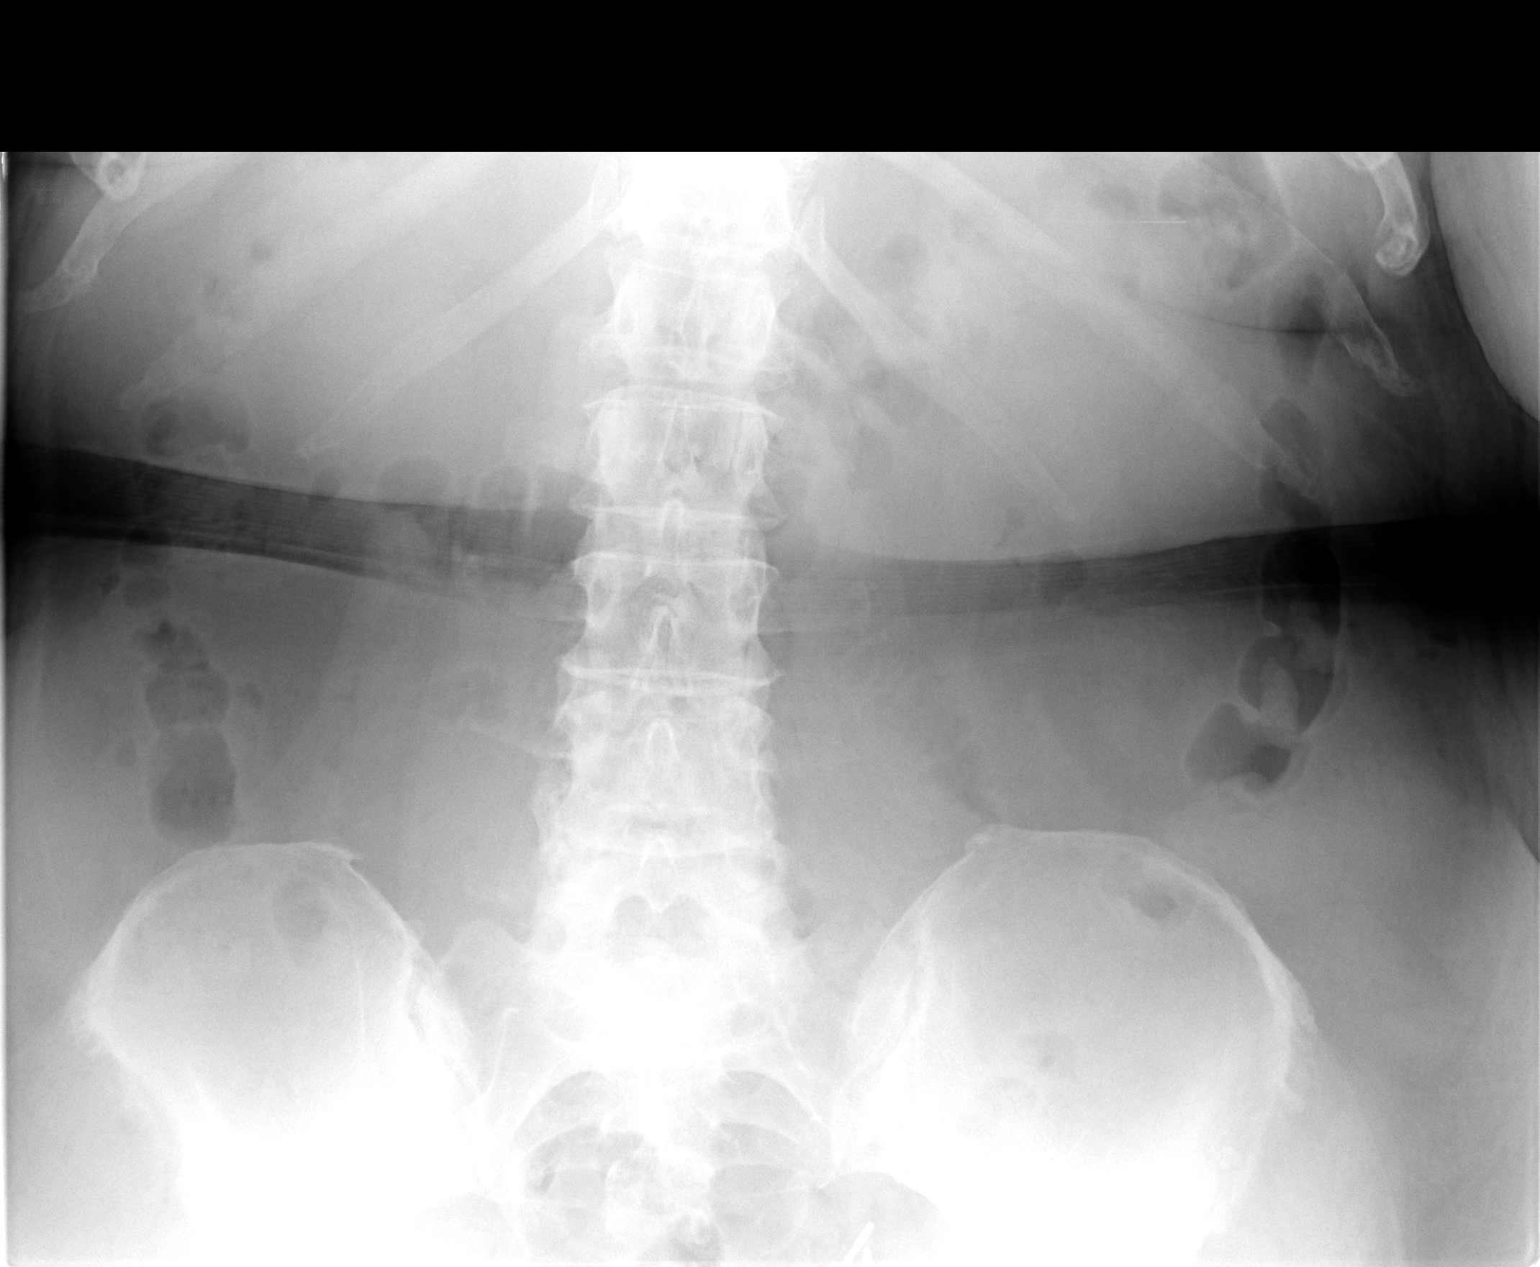

[view not recorded (2 of 2)]
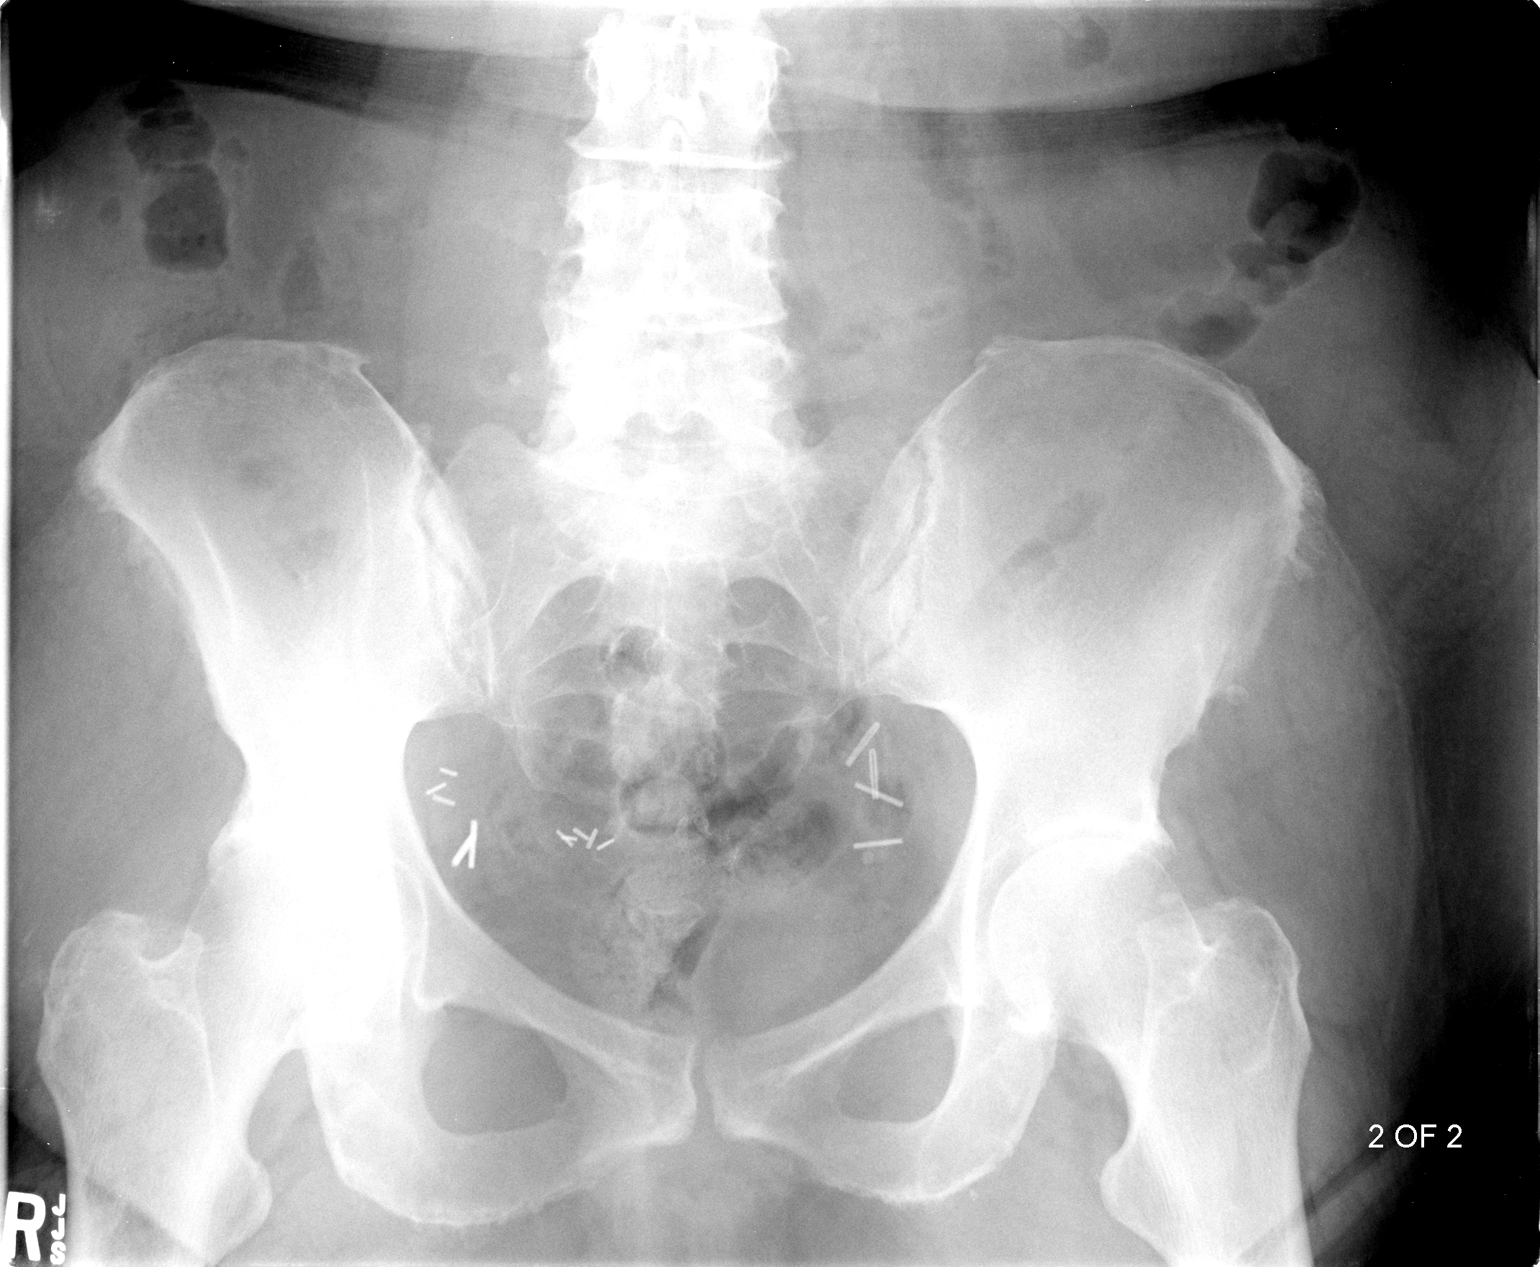

[2 of 2 positions shown; findings below may reference images not displayed]

FINDINGS: The bowel gas pattern is nonobstructive.  Gas is seen
within nondilated colon and nondilated small bowel.  Rectal gas is
present.  No significant stool burden is appreciated.  Multiple
surgical clips are seen in the pelvis bilaterally.  No radiopaque
urinary tract calculus is identified.  No acute osseous
abnormality.
IMPRESSION: Nonobstructive bowel gas pattern.  No significant stool burden.

## 2009-11-15 ENCOUNTER — Ambulatory Visit: Payer: Self-pay | Admitting: Internal Medicine

## 2009-12-09 ENCOUNTER — Ambulatory Visit: Payer: Self-pay | Admitting: Internal Medicine

## 2009-12-09 LAB — CONVERTED CEMR LAB
ALT: 23 units/L (ref 0–35)
AST: 26 units/L (ref 0–37)
Albumin: 3.6 g/dL (ref 3.5–5.2)
Alkaline Phosphatase: 38 units/L — ABNORMAL LOW (ref 39–117)
Bilirubin, Direct: 0 mg/dL (ref 0.0–0.3)
Cholesterol: 244 mg/dL — ABNORMAL HIGH (ref 0–200)
Direct LDL: 185 mg/dL
HDL: 45.1 mg/dL (ref >39.00)
Total Bilirubin: 0.6 mg/dL (ref 0.3–1.2)
Total CHOL/HDL Ratio: 5
Total Protein: 7.1 g/dL (ref 6.0–8.3)
Triglycerides: 158 mg/dL — ABNORMAL HIGH (ref 0.0–149.0)
VLDL: 31.6 mg/dL (ref 0.0–40.0)

## 2009-12-14 ENCOUNTER — Encounter: Admission: RE | Admit: 2009-12-14 | Discharge: 2009-12-14 | Payer: Self-pay | Admitting: Internal Medicine

## 2009-12-14 IMAGING — MG MM DIGITAL SCREENING
4 series · 4 of 4 positions shown · non-contrast
Comparison: none

DG SCREEN MAMMOGRAM BILATERAL
Bilateral CC and MLO view(s) were taken.

DIGITAL SCREENING MAMMOGRAM WITH CAD:
There are scattered fibroglandular densities.  Microcalcifications are present in the right breast.
Characterization with magnification views is recommended.  No mass or malignant type 
calcifications are identified in the left breast.  Compared with prior studies.
Images were processed with CAD.

[R CC]
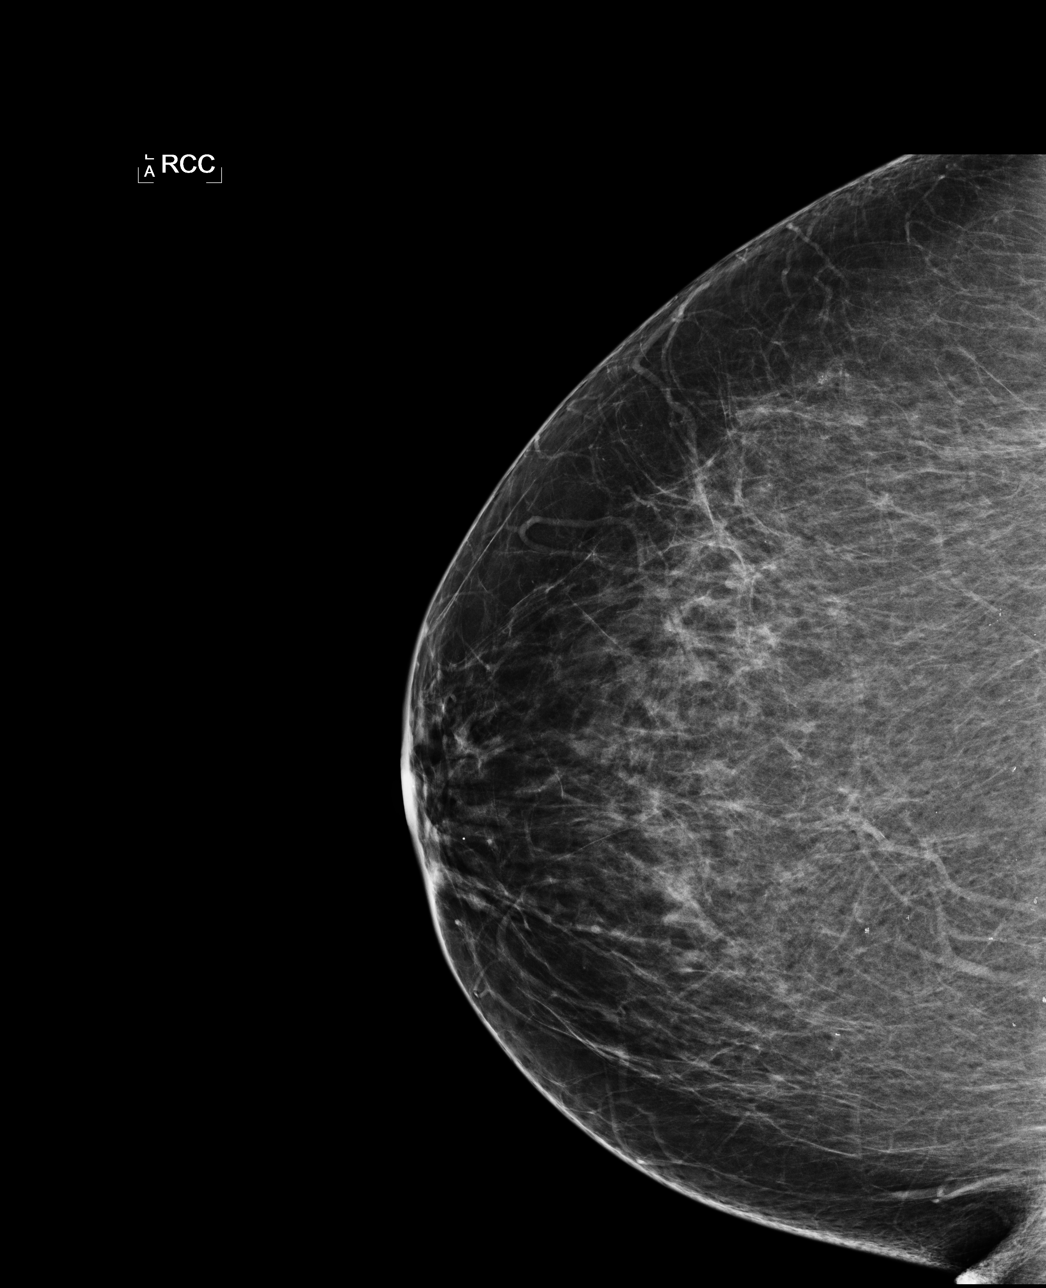

[L CC]
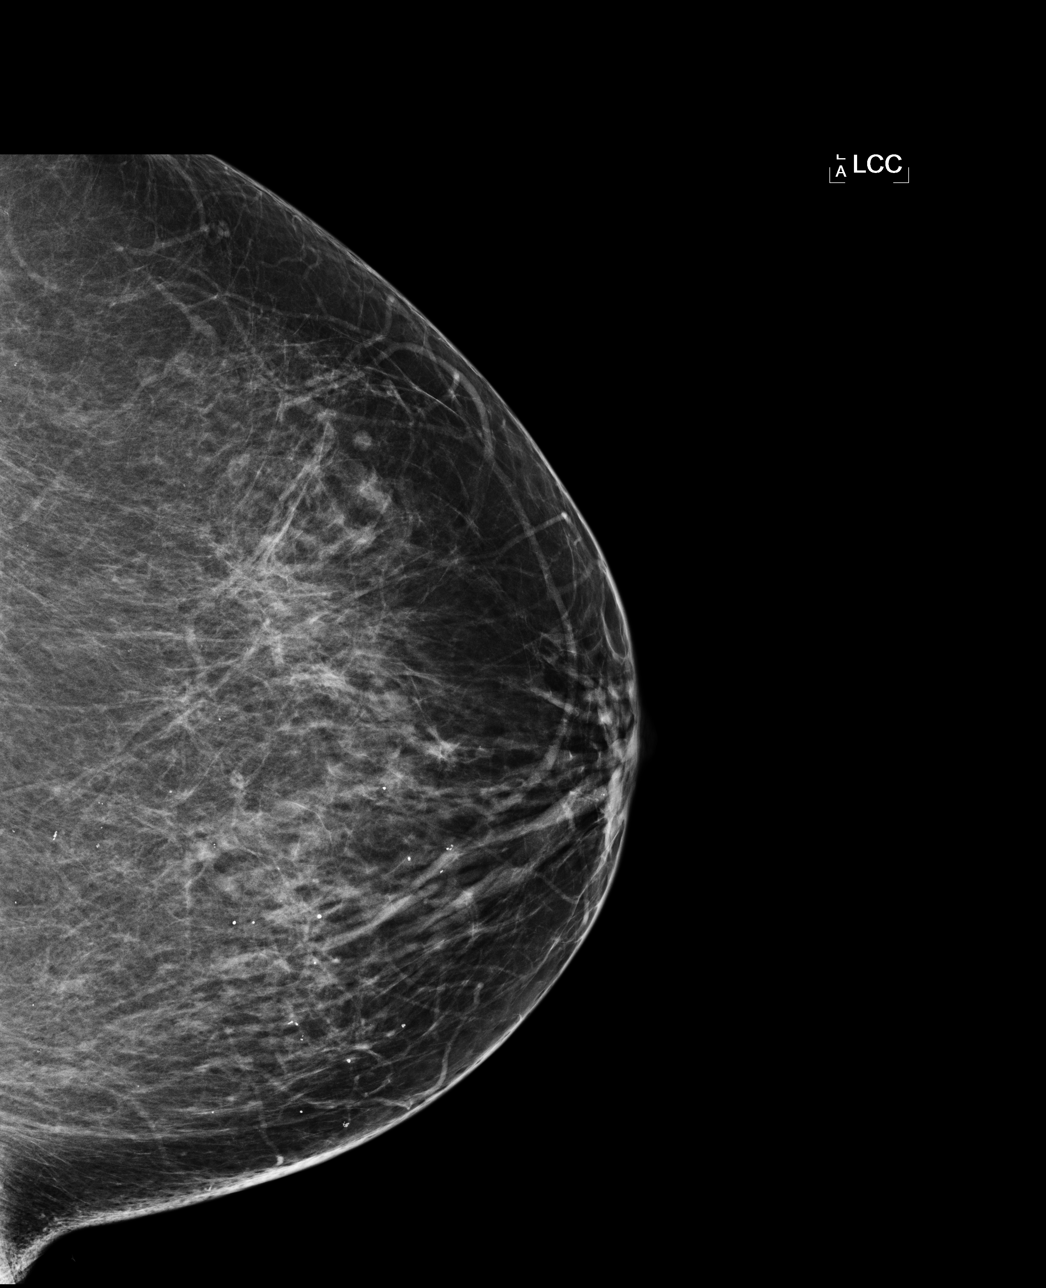

[L MLO]
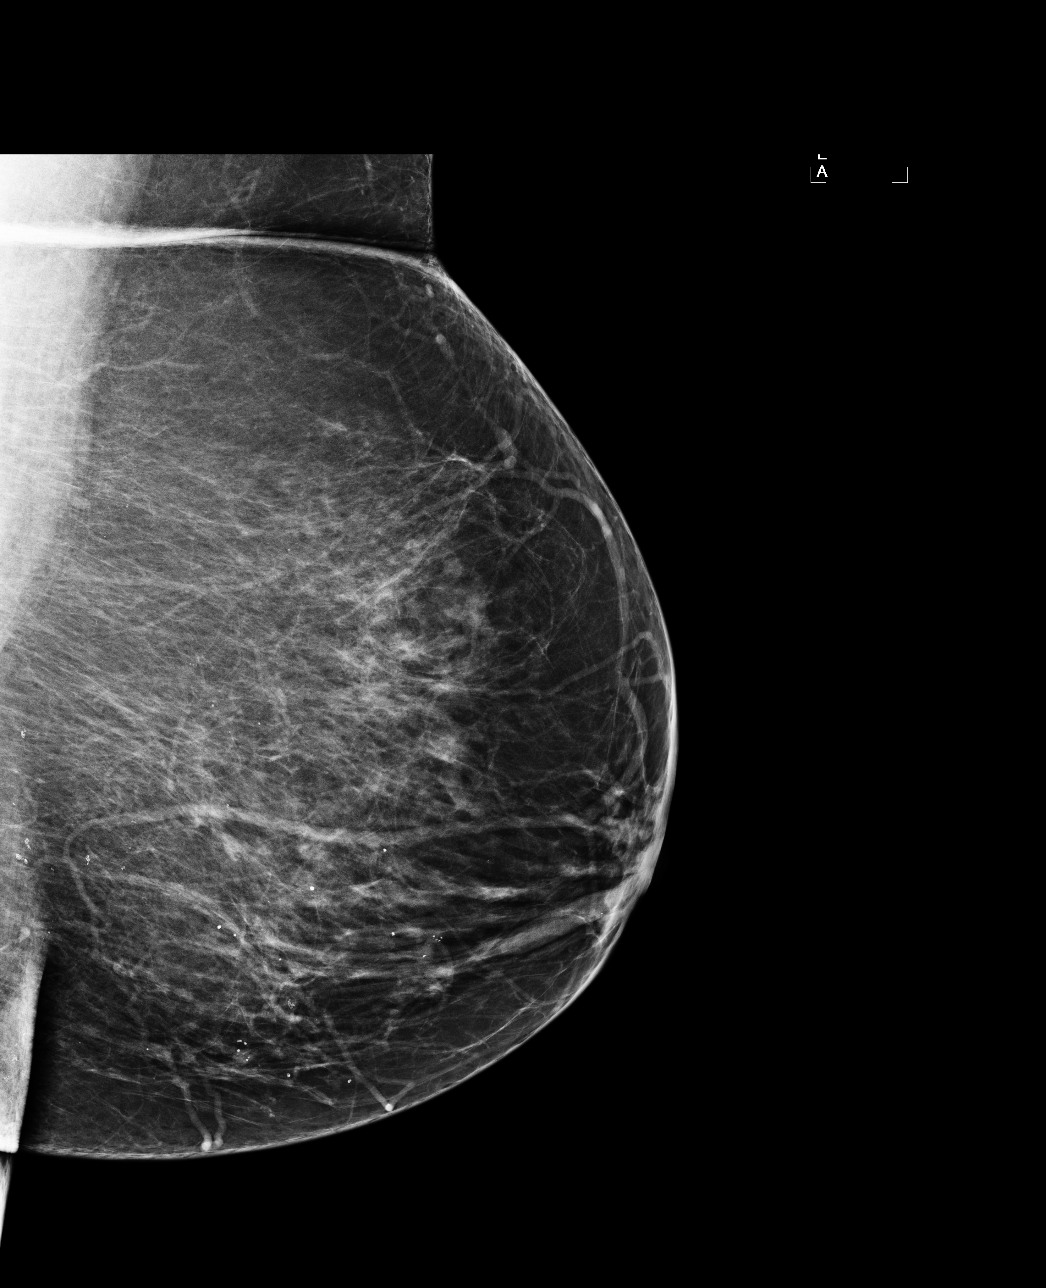

[R MLO]
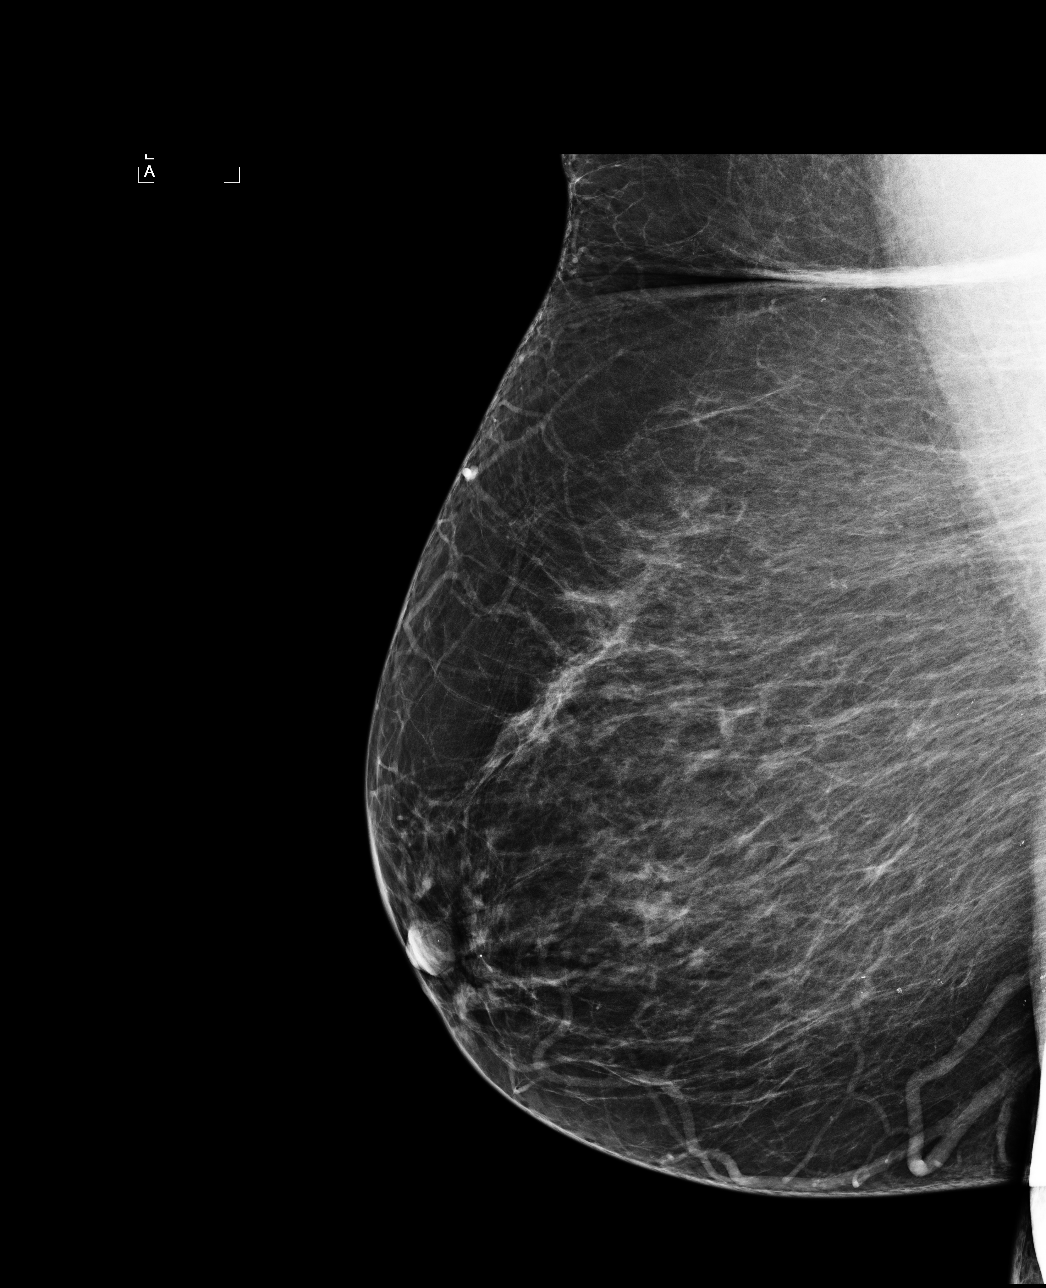

[4 of 4 positions shown; findings below may reference images not displayed]

IMPRESSION: Calcifications, right breast.  Additional evaluation is indicated. The patient will be contacted 
for additional studies and a supplementary report will follow.  No specific mammographic evidence 
of malignancy, left breast.

ASSESSMENT: Need additional imaging evaluation and/or prior mammograms for comparison - BI-RADS 0

Further imaging of the right breast.
,

## 2009-12-16 ENCOUNTER — Ambulatory Visit: Payer: Self-pay | Admitting: Internal Medicine

## 2009-12-24 ENCOUNTER — Encounter: Admission: RE | Admit: 2009-12-24 | Discharge: 2009-12-24 | Payer: Self-pay | Admitting: Internal Medicine

## 2009-12-24 IMAGING — MG MM DIGITAL DIAG LTD R
3 series · 3 of 3 positions shown · non-contrast
Comparison: [DATE] [DATE], [DATE], [DATE] [DATE], [DATE], [DATE] [DATE], [DATE]

CLINICAL DATA: Called back from screening mammogram for
calcifications right breast

DIGITAL DIAGNOSTIC RIGHT MAMMOGRAM [DATE]

[R CC]
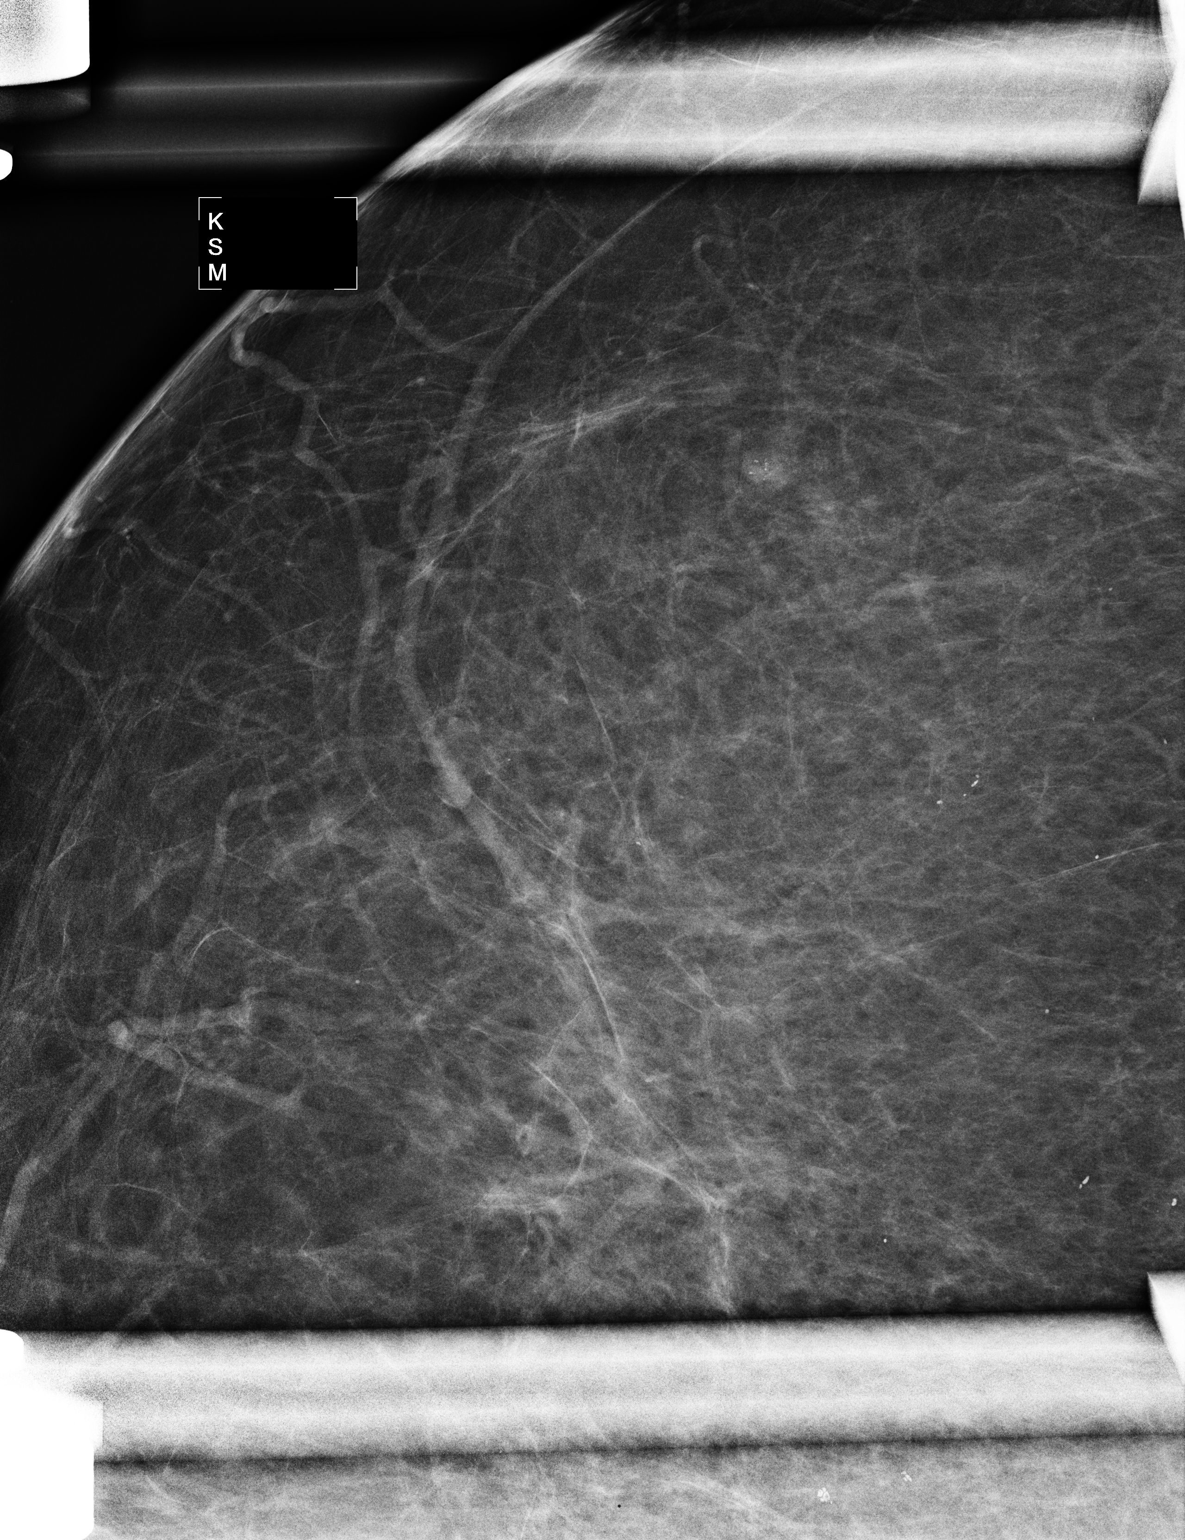

[R ML (1 of 2)]
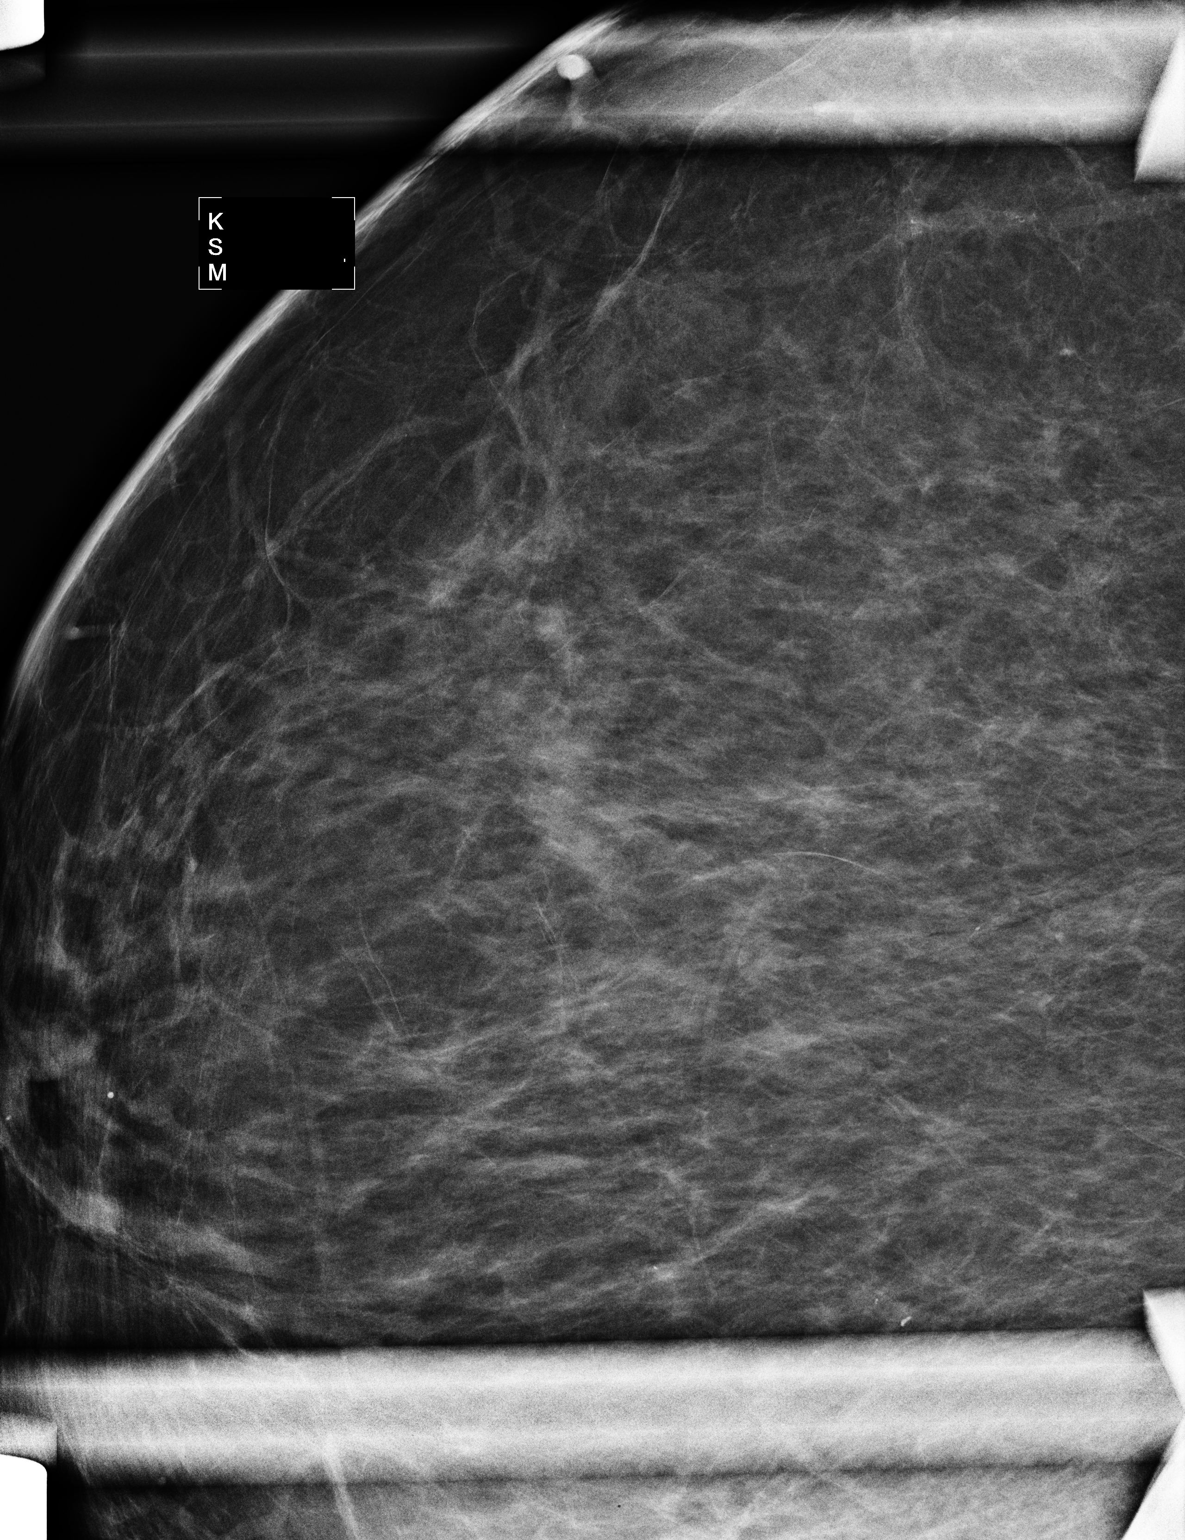

[R ML (2 of 2)]
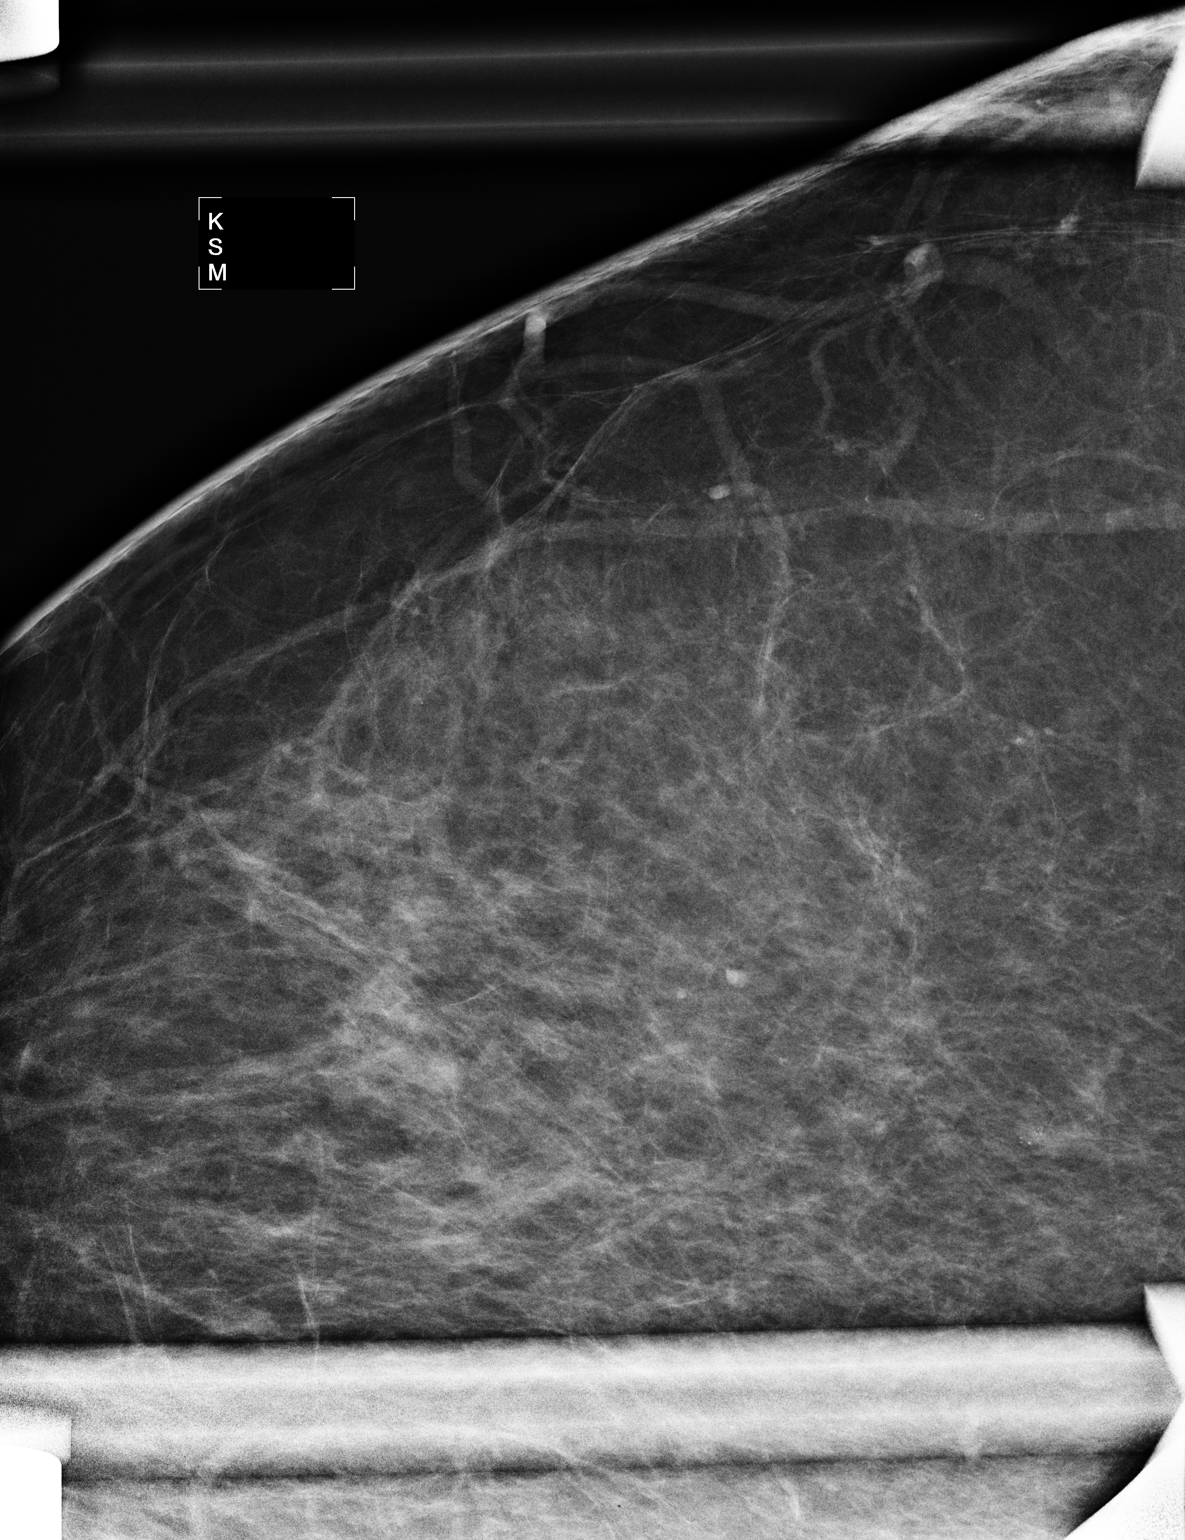

[3 of 3 positions shown; findings below may reference images not displayed]

FINDINGS: Spot magnification CC and lateral view of the right
breast are submitted.  There are indeterminate microcalcifications
with probable associated small mass in the upper outer quadrant
right breast.
Mammographic images were processed with CAD.
IMPRESSION: Suspicious findings, recommend stereotactic biopsy right breast

BI-RADS CATEGORY 4:  Suspicious abnormality - biopsy should be
considered.

## 2009-12-24 IMAGING — MG MM BREAST STEREO BIOPSY*R*
3 series · 3 of 3 positions shown · non-contrast
Comparison: none

Addendum Begins

The pathology is consistent with atypical lobular hyperplasia
versus atypical ductal hyperplasia.  Surgical excision is
recommended.  Patient is scheduled to see Dr. CENICEROS on
[DATE].  The pathology was reviewed with the patient and her
questions were answered.  The patient states her biopsy site is
clean and dry without hematoma  formation.  Post biopsy wound care
instructions were reviewed with the patient.  The pathology is felt
to be concordant with imaging findings
Addendum Ends
[DATE] –DUPLICATE COPY for exam association in RIS. No change from original report.
CLINICAL DATA: Suspicious microcalcifications right breast

[R CC (1 of 2)]
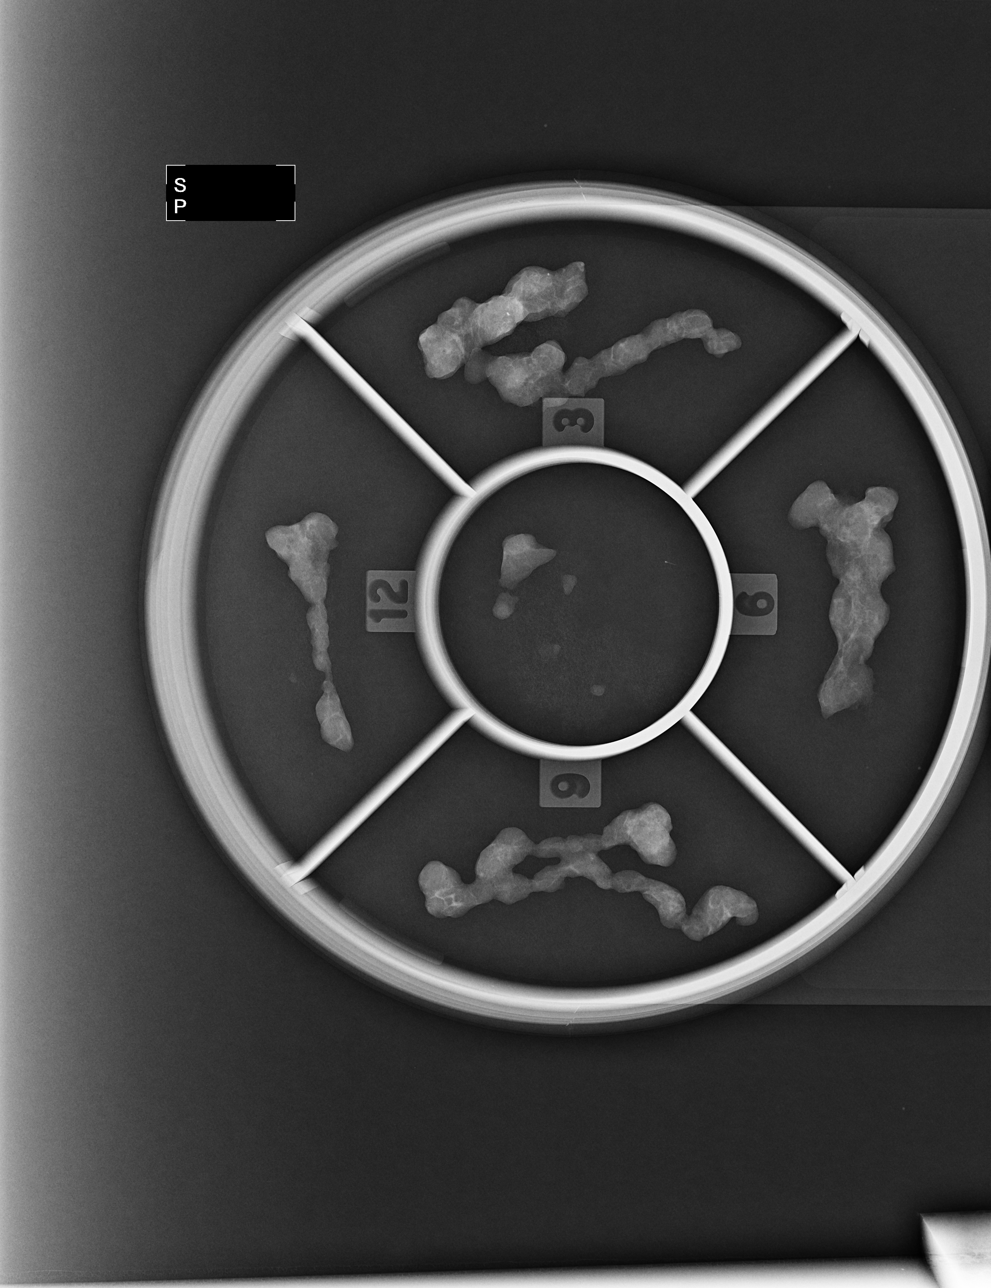

[R CC (2 of 2)]
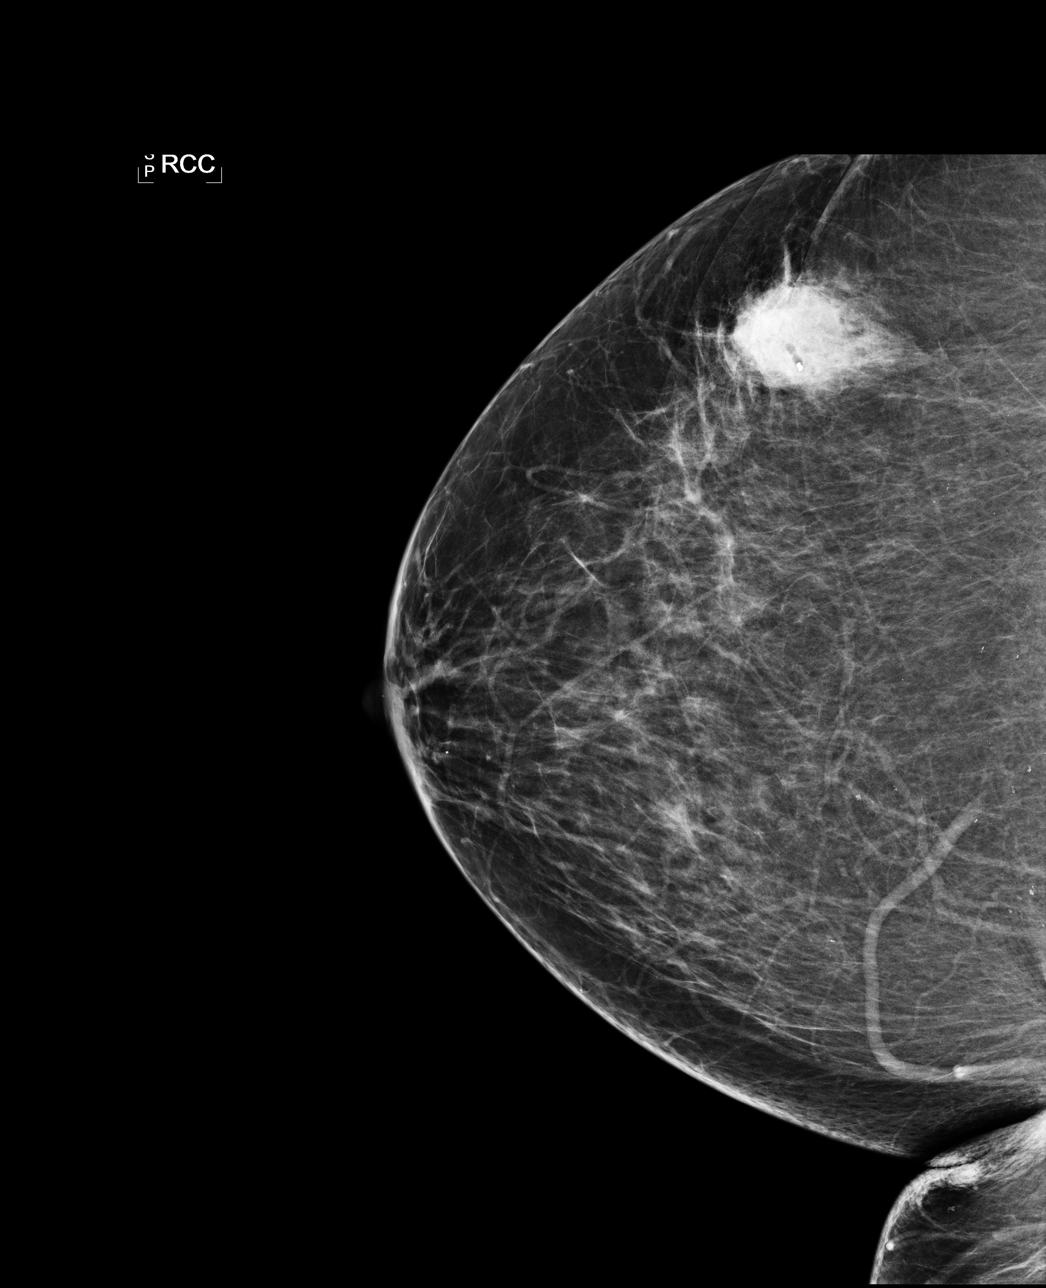

[R ML]
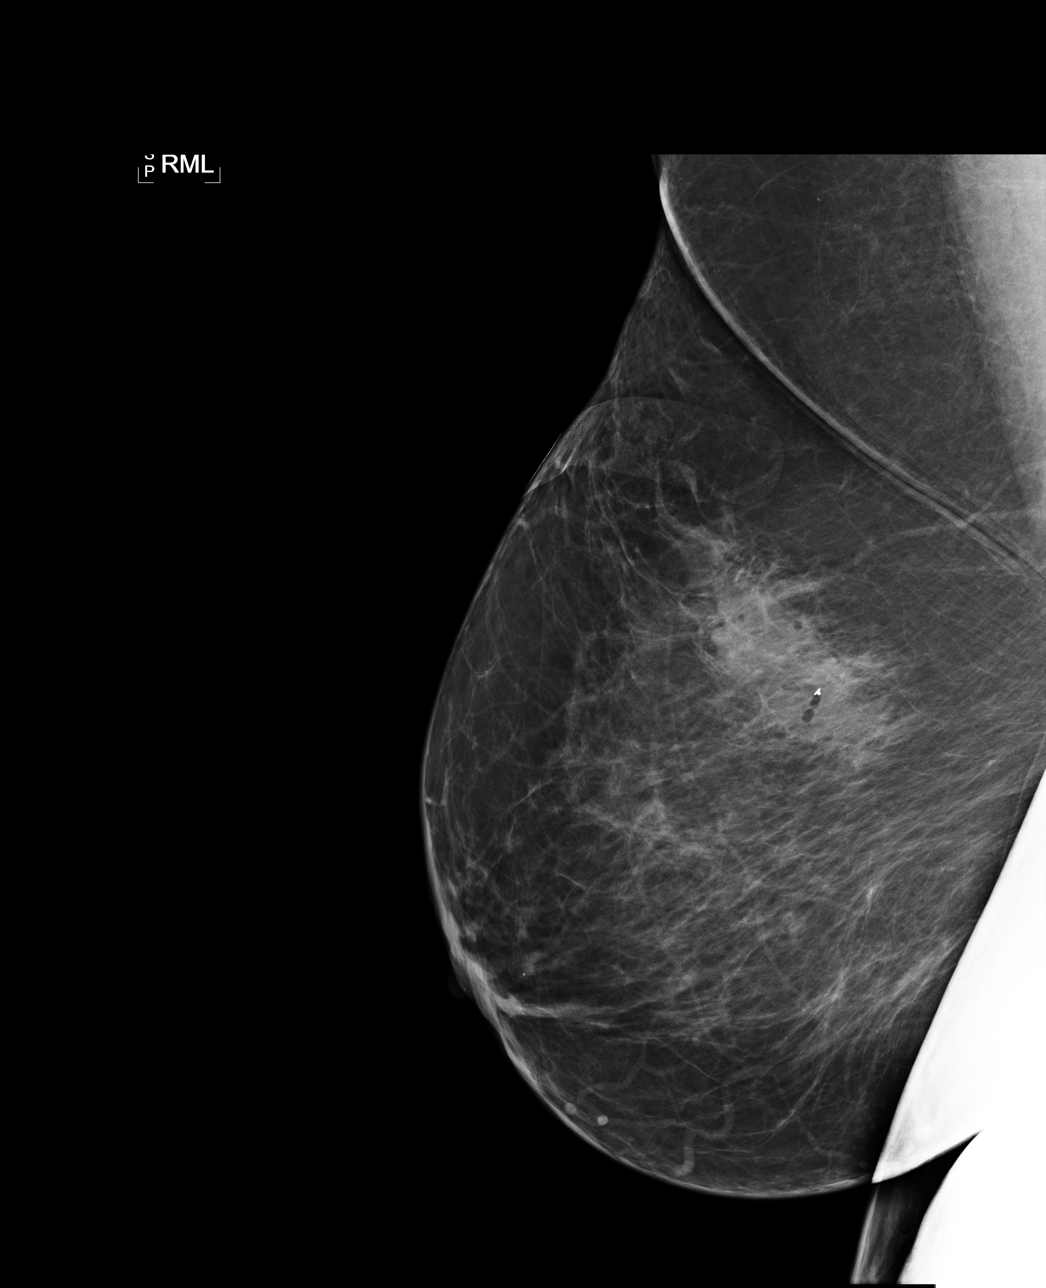

[3 of 3 positions shown; findings below may reference images not displayed]

STEREOTACTIC-GUIDED VACUUM ASSISTED BIOPSY OF THE RIGHT BREAST AND
 SPECIMEN RADIOGRAPH

 I met with the patient, and we discussed the procedure of
 stereotactic-guided biopsy, including risks, benefits, and
 alternatives. Specifically, we discussed the risks of infection,
 bleeding, tissue injury, clip migration, and inadequate sampling.
 Informed, written consent was given.

 Using sterile technique, 2% lidocaine, stereotactic guidance, and a
 9 gauge vacuum assisted device, biopsy was performed of
 indeterminate microcalcifications right breast. Specimen
 radiograph was performed, showing inclusion of calcifications of
 concern. Specimens with calcifications are identified for
 pathology.

 At the conclusion of the procedure, a tissue marker clip was
 deployed into the biopsy cavity. Follow-up 2-view mammogram
 confirmed clip T-shaped biopsy clip in the area of concern.
IMPRESSION: Stereotactic-guided biopsy of right breast. No apparent
 complications.

## 2009-12-30 ENCOUNTER — Ambulatory Visit: Payer: Self-pay | Admitting: Internal Medicine

## 2009-12-30 LAB — CONVERTED CEMR LAB
Bilirubin Urine: NEGATIVE
Blood in Urine, dipstick: NEGATIVE
Glucose, Urine, Semiquant: NEGATIVE
Ketones, urine, test strip: NEGATIVE
Nitrite: NEGATIVE
Protein, U semiquant: NEGATIVE
Specific Gravity, Urine: 1.02
Urobilinogen, UA: 0.2
pH: 5

## 2010-01-04 ENCOUNTER — Encounter: Payer: Self-pay | Admitting: Internal Medicine

## 2010-01-10 ENCOUNTER — Telehealth: Payer: Self-pay | Admitting: Internal Medicine

## 2010-01-10 DIAGNOSIS — N63 Unspecified lump in unspecified breast: Secondary | ICD-10-CM | POA: Insufficient documentation

## 2010-01-27 ENCOUNTER — Encounter: Admission: RE | Admit: 2010-01-27 | Discharge: 2010-01-27 | Payer: Self-pay | Admitting: Surgery

## 2010-01-27 ENCOUNTER — Ambulatory Visit (HOSPITAL_BASED_OUTPATIENT_CLINIC_OR_DEPARTMENT_OTHER): Admission: RE | Admit: 2010-01-27 | Discharge: 2010-01-27 | Payer: Self-pay | Admitting: Surgery

## 2010-01-27 IMAGING — MG MM BREAST WIRE LOCALIZATION*R*
5 series · 5 of 5 positions shown · non-contrast
Comparison: none

[DATE] –DUPLICATE COPY for exam association in RIS. No change from original report.
CLINICAL DATA: Calcifications in the right breast with atypical
 hyperplasia

[R LM (1 of 2)]
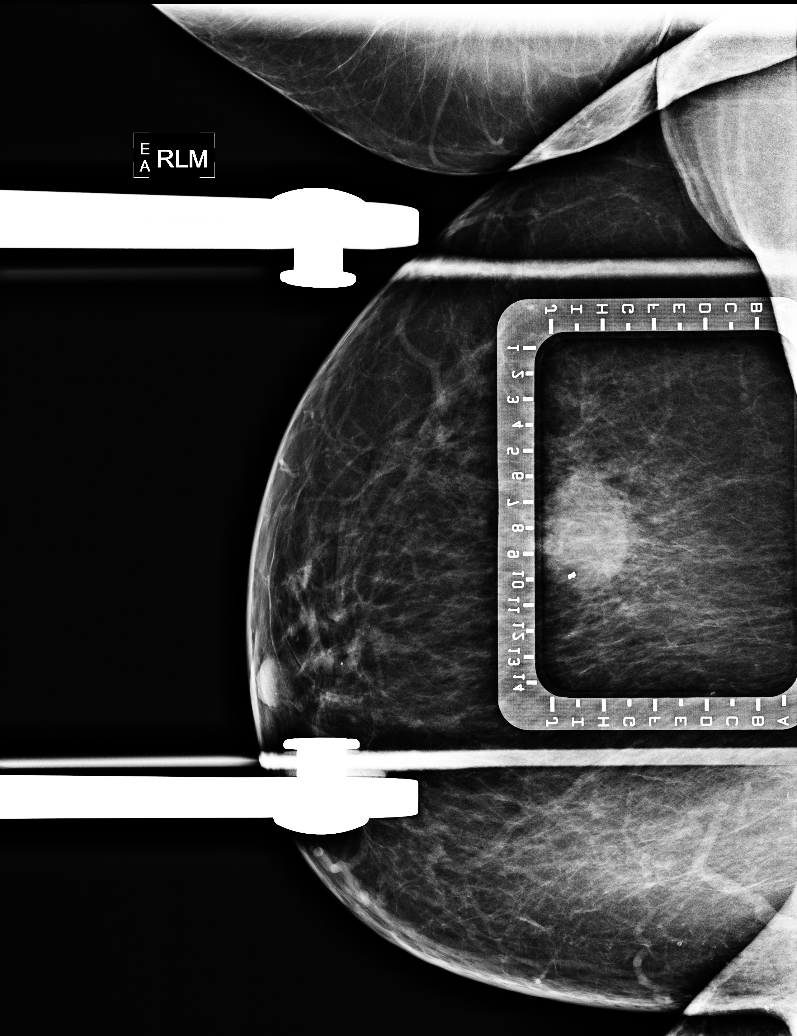

[R LM (2 of 2)]
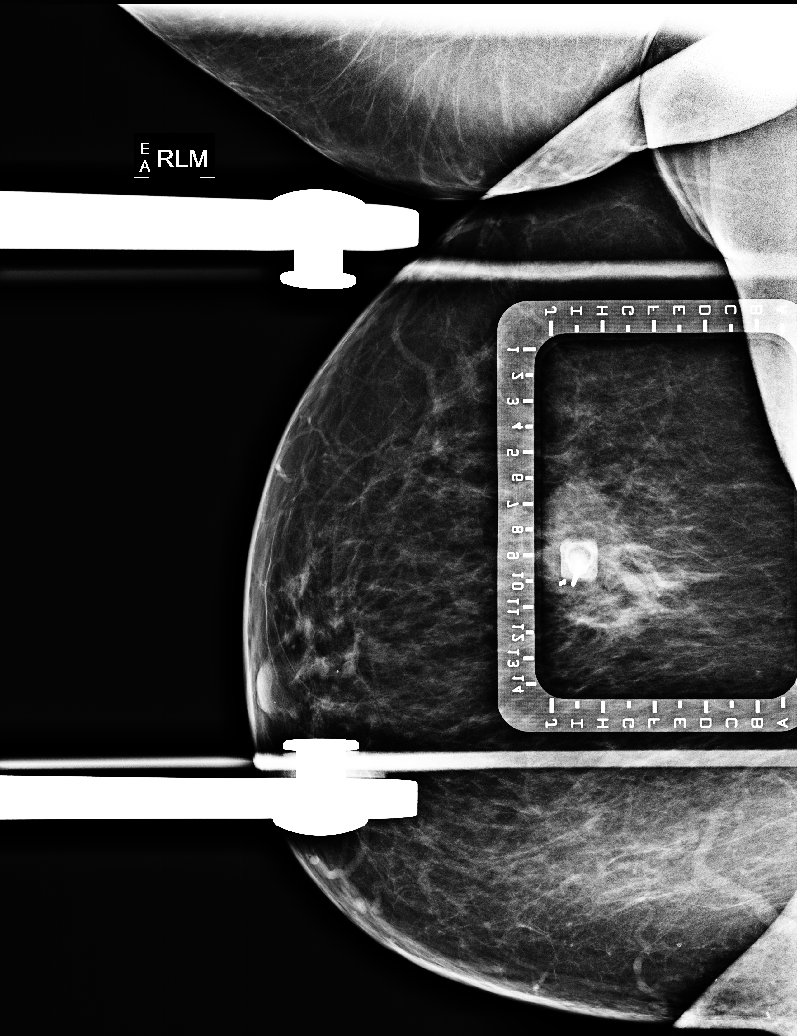

[R CC (1 of 3)]
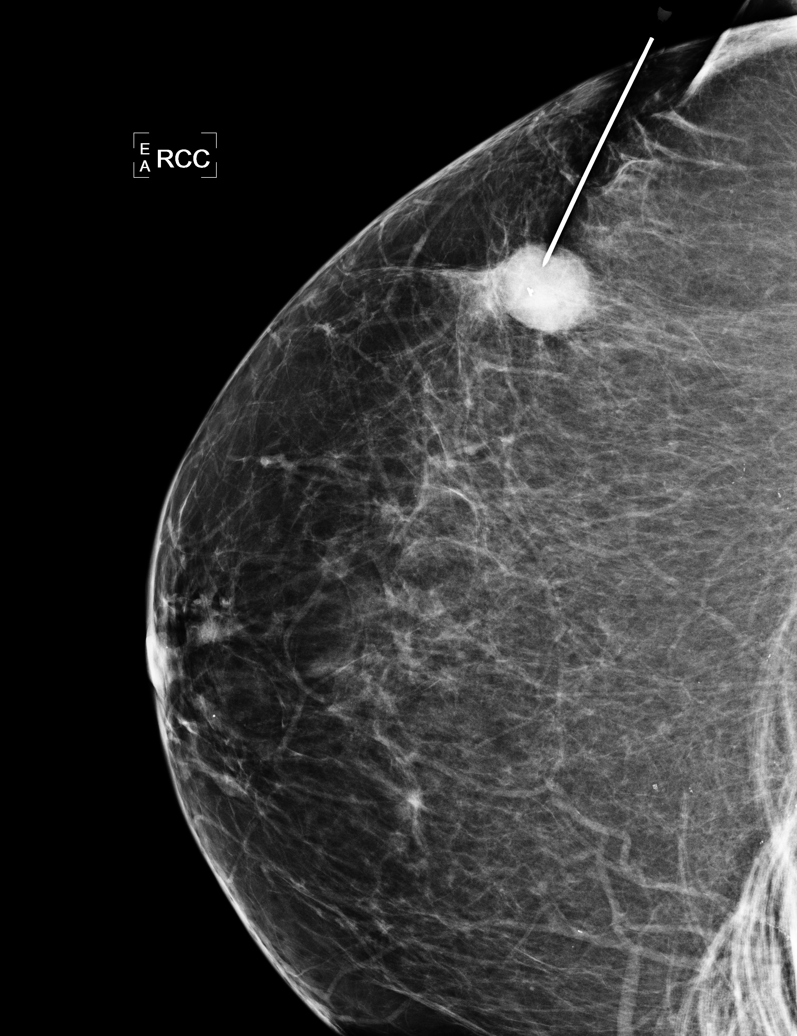

[R CC (2 of 3)]
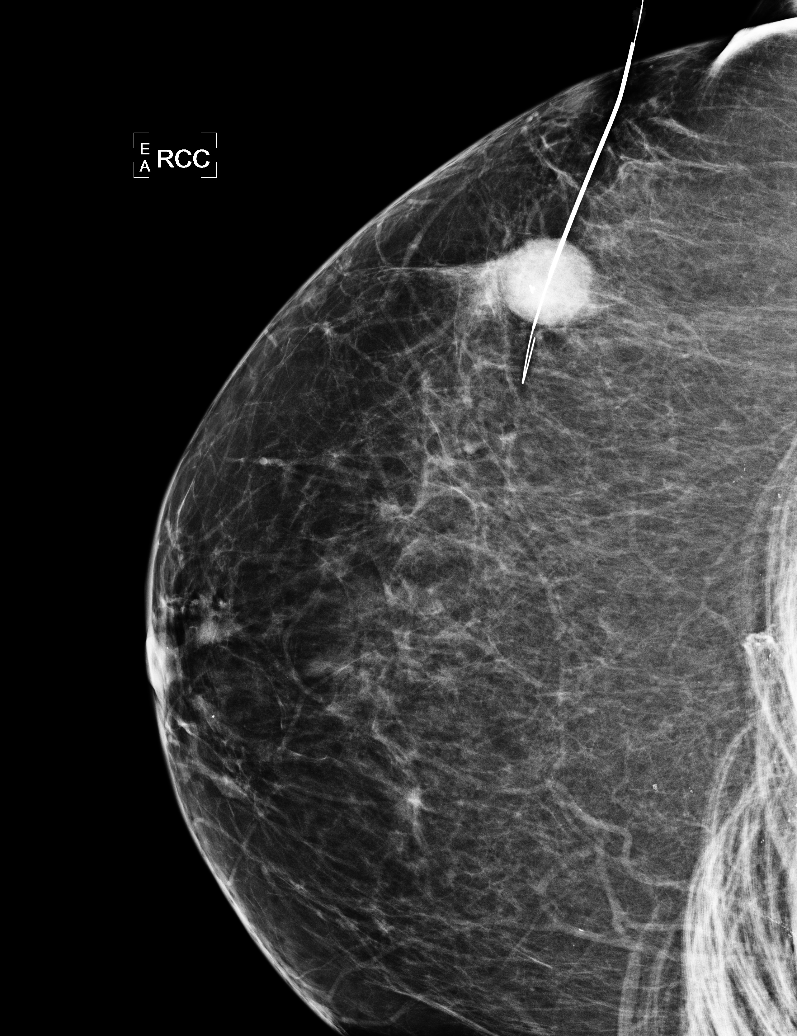

[R CC (3 of 3)]
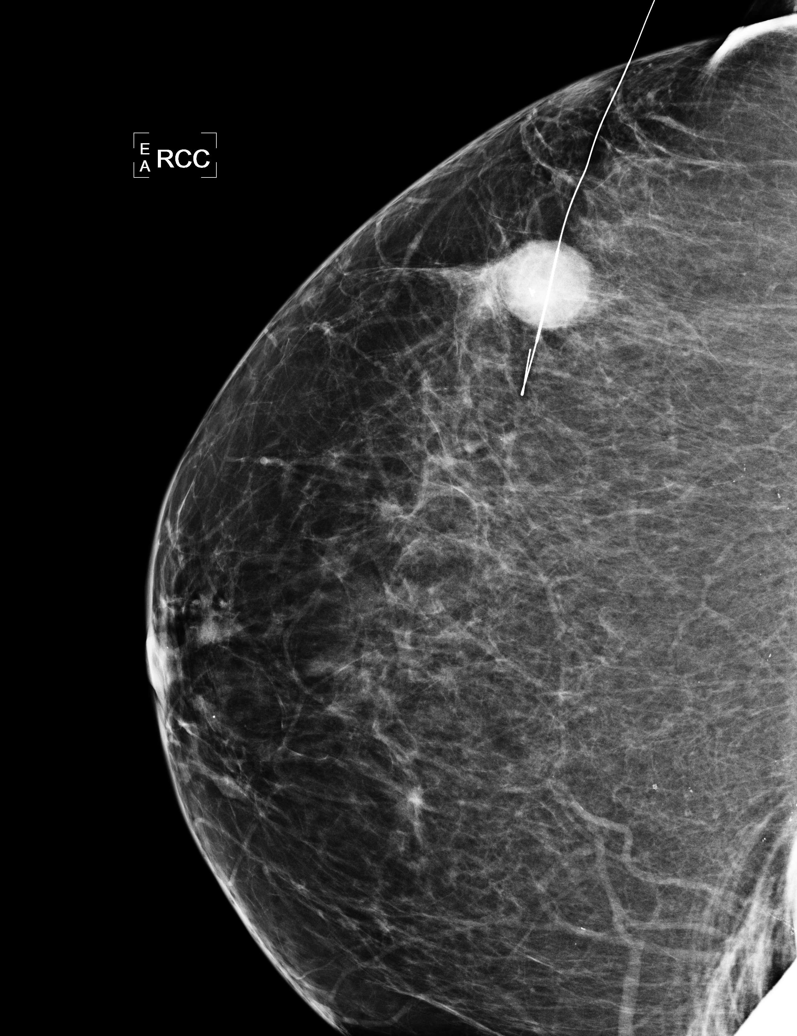

[5 of 5 positions shown; findings below may reference images not displayed]

NEEDLE LOCALIZATION WITH MAMMOGRAPHIC GUIDANCE AND SPECIMEN
 RADIOGRAPH

 Patient presents for needle localization prior to excision of right
 breast calcifications. I met with the patient and we discussed the
 procedure of needle localization including risks, benefits, and
 alternatives. Specifically, we discussed the risks of infection,
 bleeding, tissue injury, and inadequate sampling. Informed written
 consent was given.

 Using mammographic guidance, sterile technique, 2% lidocaine and a
 5 cm modified Kopans needle, localized using a lateral approach.
 The films are marked for Dr. BANDO.

 Specimen radiograph was performed at day surgery, and confirms
 marker clip present in the tissue sample. The specimen is marked
 for pathology.
IMPRESSION: Needle localization of calcifications in the lateral right breast.
 No apparent complications.

## 2010-02-01 ENCOUNTER — Encounter: Payer: Self-pay | Admitting: Internal Medicine

## 2010-02-08 ENCOUNTER — Ambulatory Visit: Payer: Self-pay | Admitting: Internal Medicine

## 2010-02-08 LAB — CONVERTED CEMR LAB
ALT: 22 units/L (ref 0–35)
AST: 27 units/L (ref 0–37)
Albumin: 3.5 g/dL (ref 3.5–5.2)
Alkaline Phosphatase: 39 units/L (ref 39–117)
Bilirubin, Direct: 0 mg/dL (ref 0.0–0.3)
Cholesterol: 223 mg/dL — ABNORMAL HIGH (ref 0–200)
Direct LDL: 160.9 mg/dL
HDL: 45 mg/dL (ref >39.00)
Total Bilirubin: 0.8 mg/dL (ref 0.3–1.2)
Total CHOL/HDL Ratio: 5
Total Protein: 7.1 g/dL (ref 6.0–8.3)
Triglycerides: 128 mg/dL (ref 0.0–149.0)
VLDL: 25.6 mg/dL (ref 0.0–40.0)

## 2010-02-15 ENCOUNTER — Ambulatory Visit: Payer: Self-pay | Admitting: Internal Medicine

## 2010-02-15 DIAGNOSIS — L02818 Cutaneous abscess of other sites: Secondary | ICD-10-CM | POA: Insufficient documentation

## 2010-02-15 DIAGNOSIS — L080 Pyoderma: Secondary | ICD-10-CM

## 2010-02-15 DIAGNOSIS — L03818 Cellulitis of other sites: Secondary | ICD-10-CM

## 2010-02-15 HISTORY — DX: Pyoderma: L08.0

## 2010-05-02 ENCOUNTER — Ambulatory Visit: Payer: Self-pay | Admitting: Surgery

## 2010-05-02 ENCOUNTER — Emergency Department (HOSPITAL_COMMUNITY): Admission: EM | Admit: 2010-05-02 | Discharge: 2010-05-02 | Payer: Self-pay | Admitting: Emergency Medicine

## 2010-05-02 IMAGING — CT CT CERVICAL SPINE W/O CM
3 series · 13 of 20 positions shown, 15 images · non-contrast
Comparison: None.

CLINICAL DATA: Right arm numbness for 2 days.

CT CERVICAL SPINE WITHOUT CONTRAST
TECHNIQUE: Multidetector CT imaging of the cervical spine was
performed. Multiplanar CT image reconstructions were also
generated.

[Series 3: c_spine 2.0 b31s · axial · 0.23mm/px · z∈[-158,-38]mm · 5 of 91 slices shown]
[im 16/91  bone]
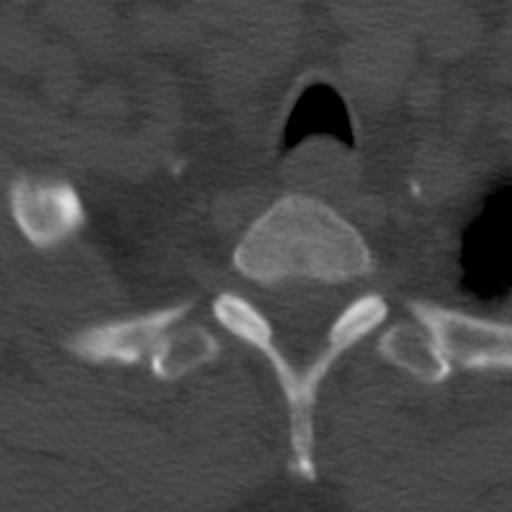
[im 31/91  bone]
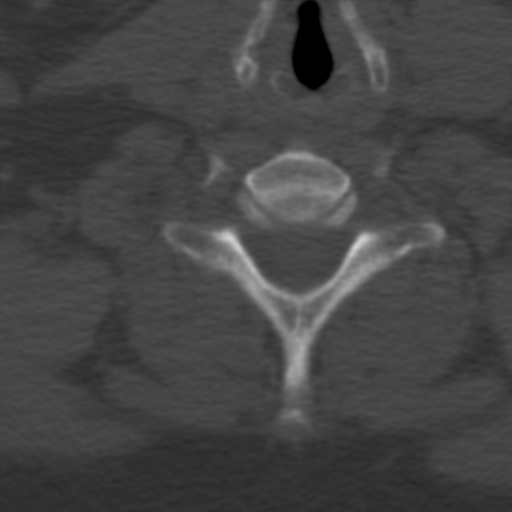
[im 46/91  bone]
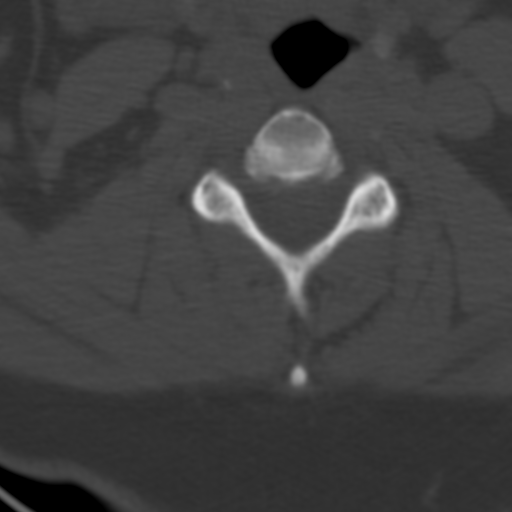
[im 61/91  bone]
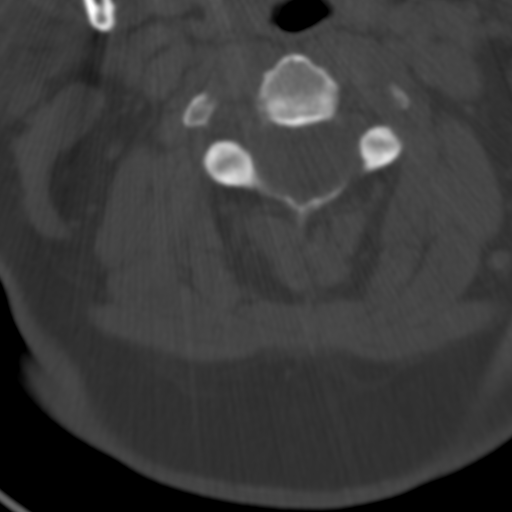
[im 76/91  bone]
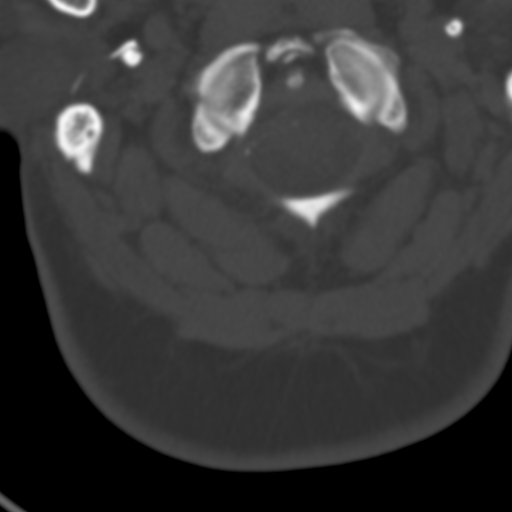

[Series 602: <mpr thick range> · coronal · 0.35mm/px · 3 of 49 slices shown]
[im 10/49  bone]
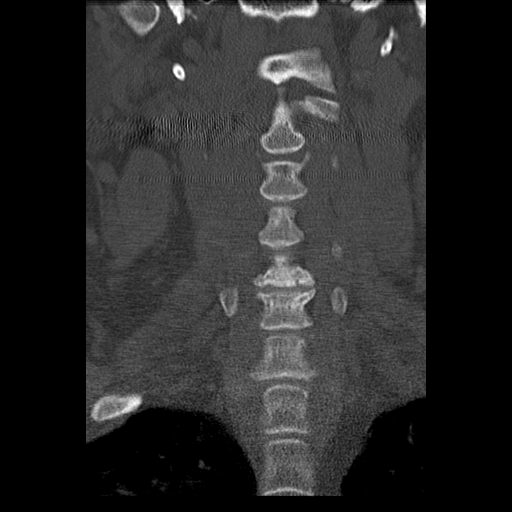
[im 20/49  bone]
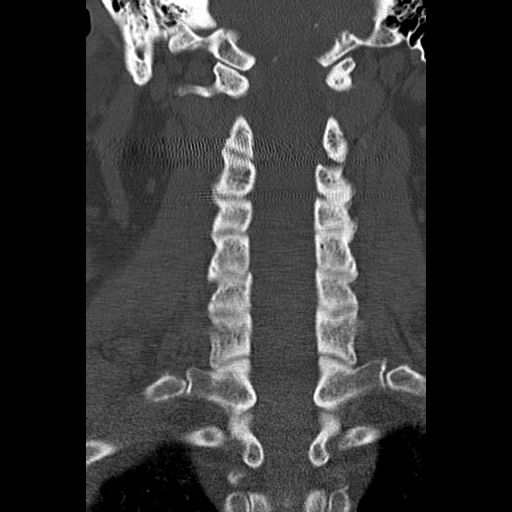
[im 29/49  bone]
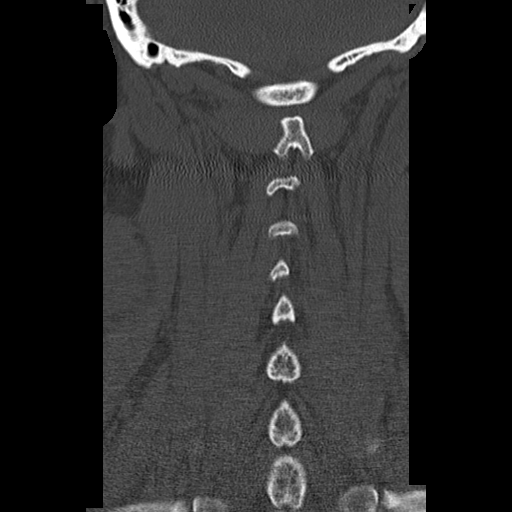

[Series 603: <mpr thick range(1)> · axial · 0.35mm/px · z∈[-186,-68]mm · 5 of 93 slices shown, 7 images]
[im 16/93  soft-tissue]
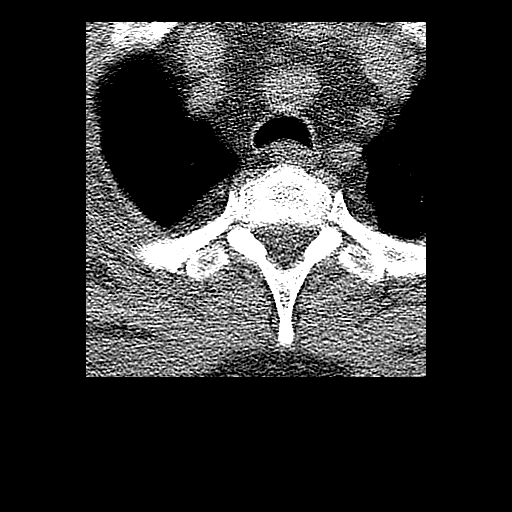
[im 16/93  bone]
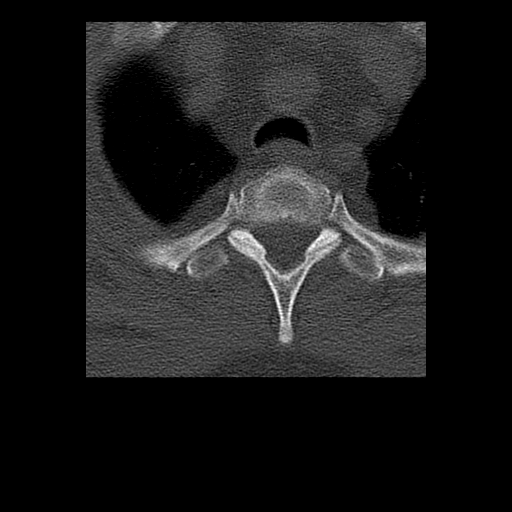
[im 31/93  bone]
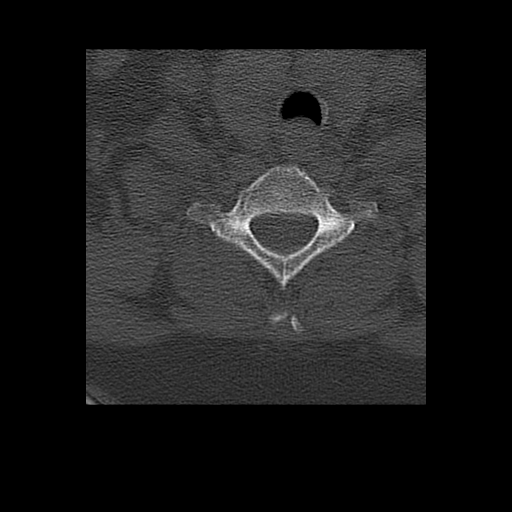
[im 47/93  bone]
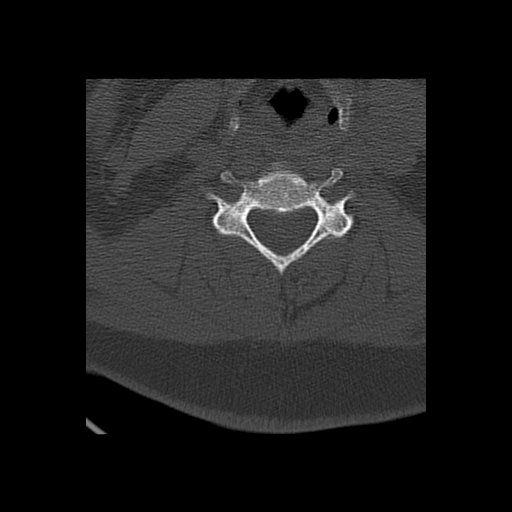
[im 62/93  bone]
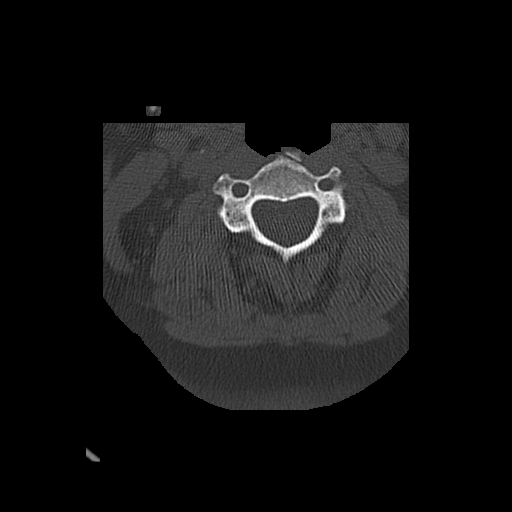
[im 77/93  soft-tissue]
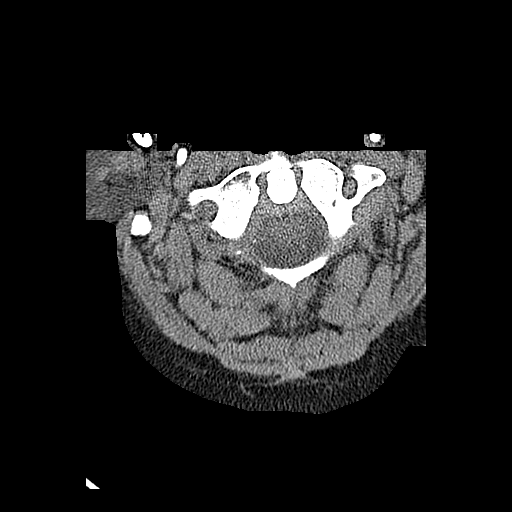
[im 77/93  bone]
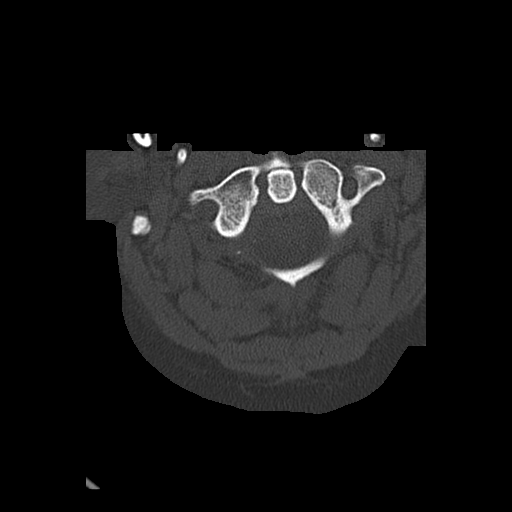

[13 of 20 positions shown; findings below may reference images not displayed]

FINDINGS: There is mild degenerative disc disease at C5-6 with
slight disc space narrowing.  No disc protrusion or foraminal
stenosis.

There is mild facet joint arthritis at C3-4 bilaterally and C4-5 on
the left.

Mild old avulsions  of the tip of the spinous process of C6.

The paraspinal soft tissues are normal.
IMPRESSION: No significant abnormality of the cervical spine.  Mild
degenerative disc disease at C5-6 without evidence of focal disc
protrusion or significant spurring.

## 2010-05-05 ENCOUNTER — Ambulatory Visit: Payer: Self-pay | Admitting: Family Medicine

## 2010-05-05 LAB — CONVERTED CEMR LAB
ALT: 26 units/L (ref 0–35)
AST: 31 units/L (ref 0–37)
Albumin: 4 g/dL (ref 3.5–5.2)
Alkaline Phosphatase: 41 units/L (ref 39–117)
Bilirubin, Direct: 0.1 mg/dL (ref 0.0–0.3)
Cholesterol: 217 mg/dL — ABNORMAL HIGH (ref 0–200)
Direct LDL: 153.1 mg/dL
HDL: 42.8 mg/dL (ref >39.00)
Total Bilirubin: 0.9 mg/dL (ref 0.3–1.2)
Total CHOL/HDL Ratio: 5
Total Protein: 7.2 g/dL (ref 6.0–8.3)
Triglycerides: 152 mg/dL — ABNORMAL HIGH (ref 0.0–149.0)
VLDL: 30.4 mg/dL (ref 0.0–40.0)

## 2010-05-12 ENCOUNTER — Ambulatory Visit: Payer: Self-pay | Admitting: Internal Medicine

## 2010-05-12 IMAGING — CR DG KNEE COMPLETE 4+V*R*
4 series · 4 of 4 positions shown · non-contrast
Comparison: None.

CLINICAL DATA: Knee pain, osteoarthritis

RIGHT KNEE - COMPLETE 4+ VIEW

[view not recorded (1 of 4)]
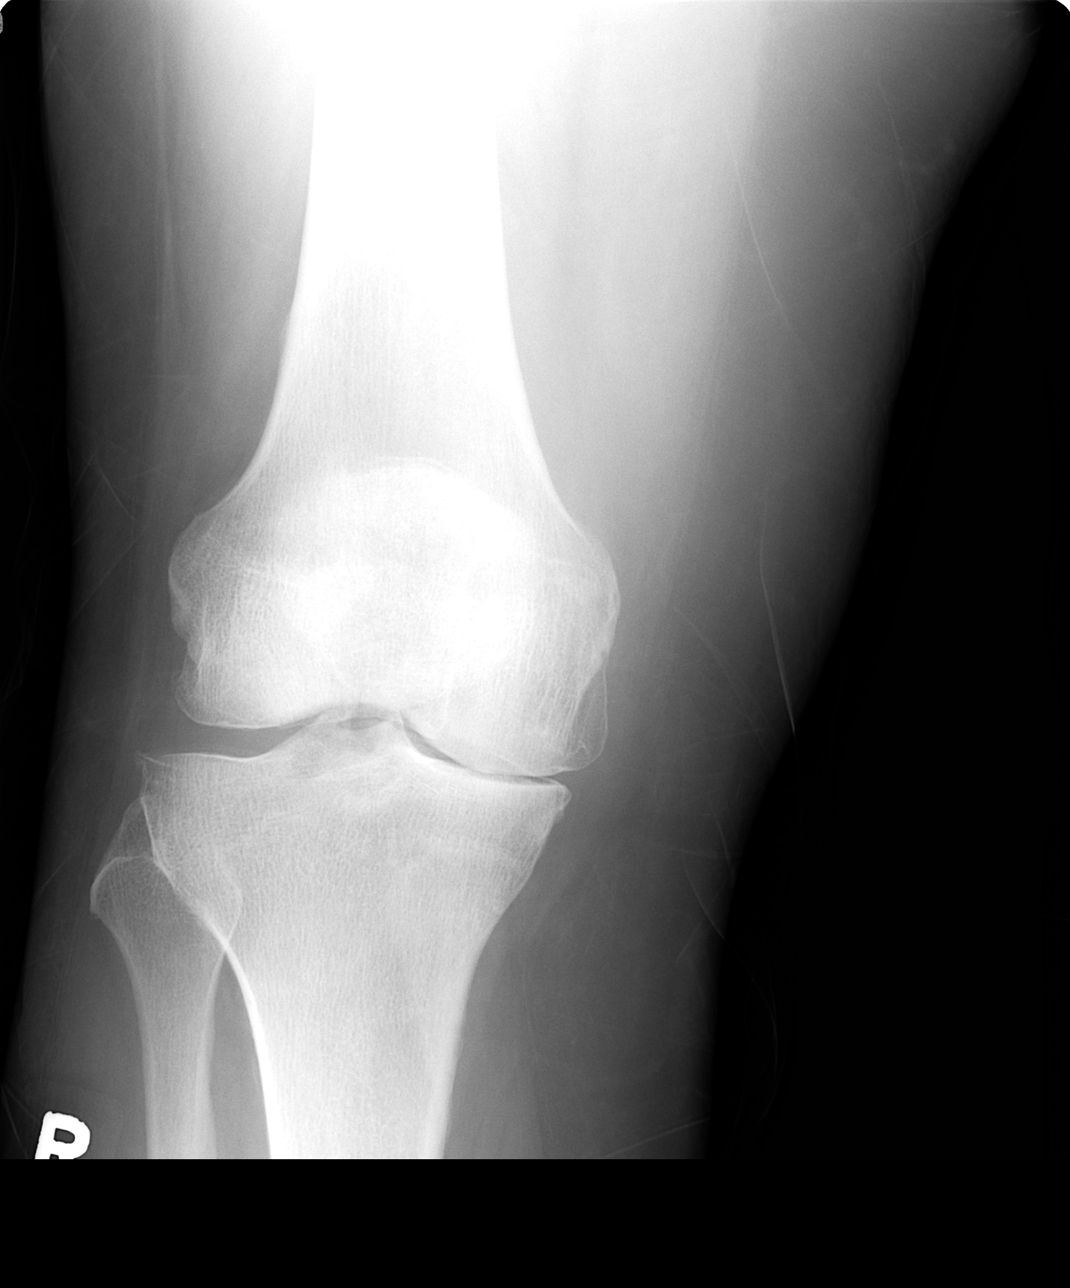

[view not recorded (2 of 4)]
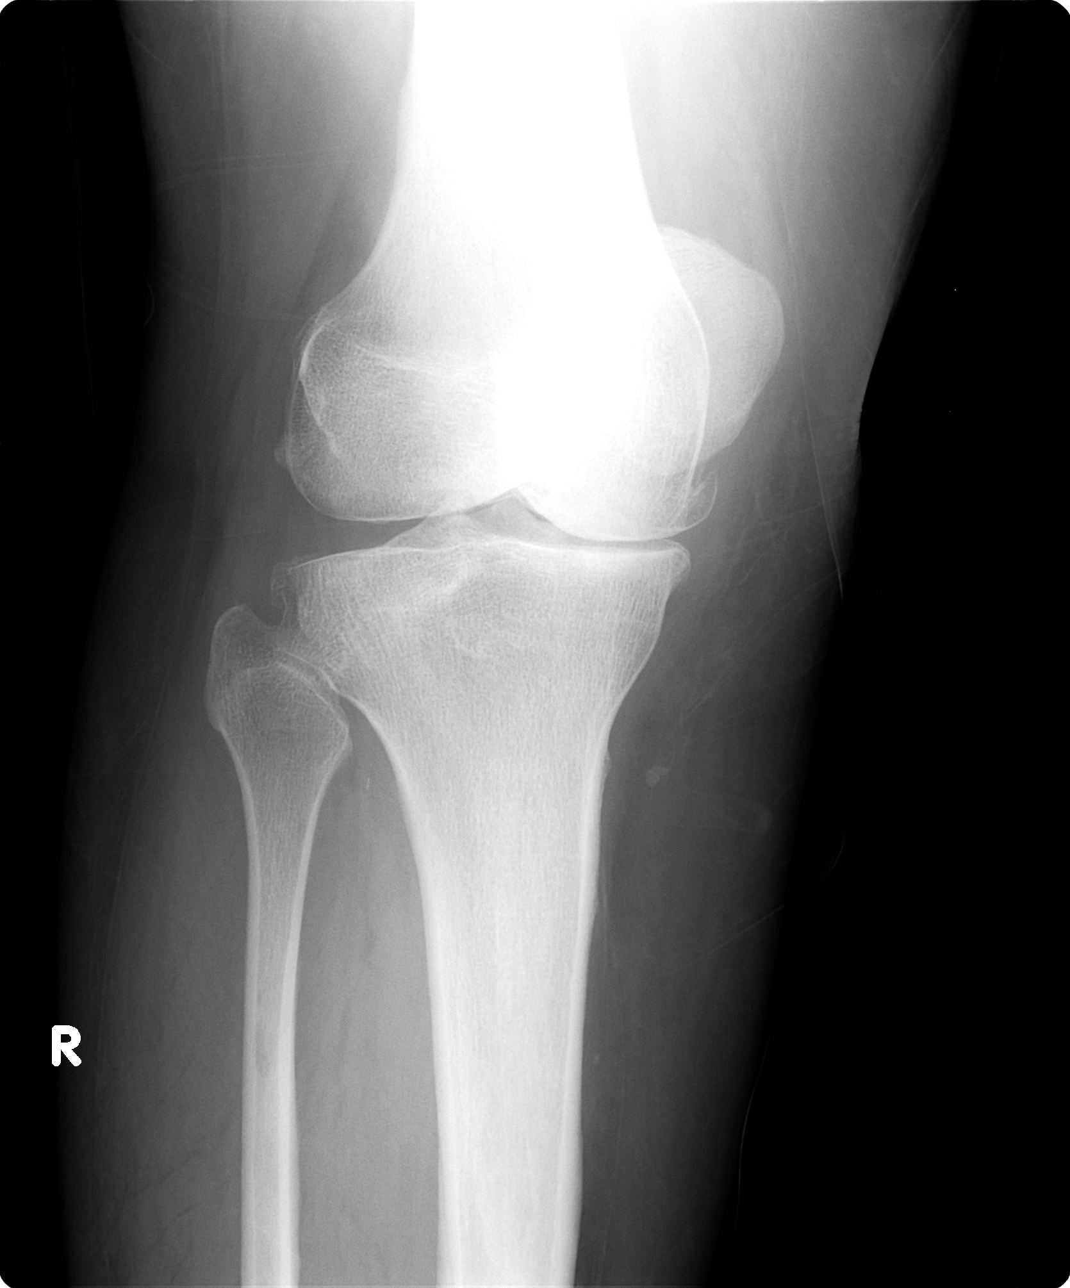

[view not recorded (3 of 4)]
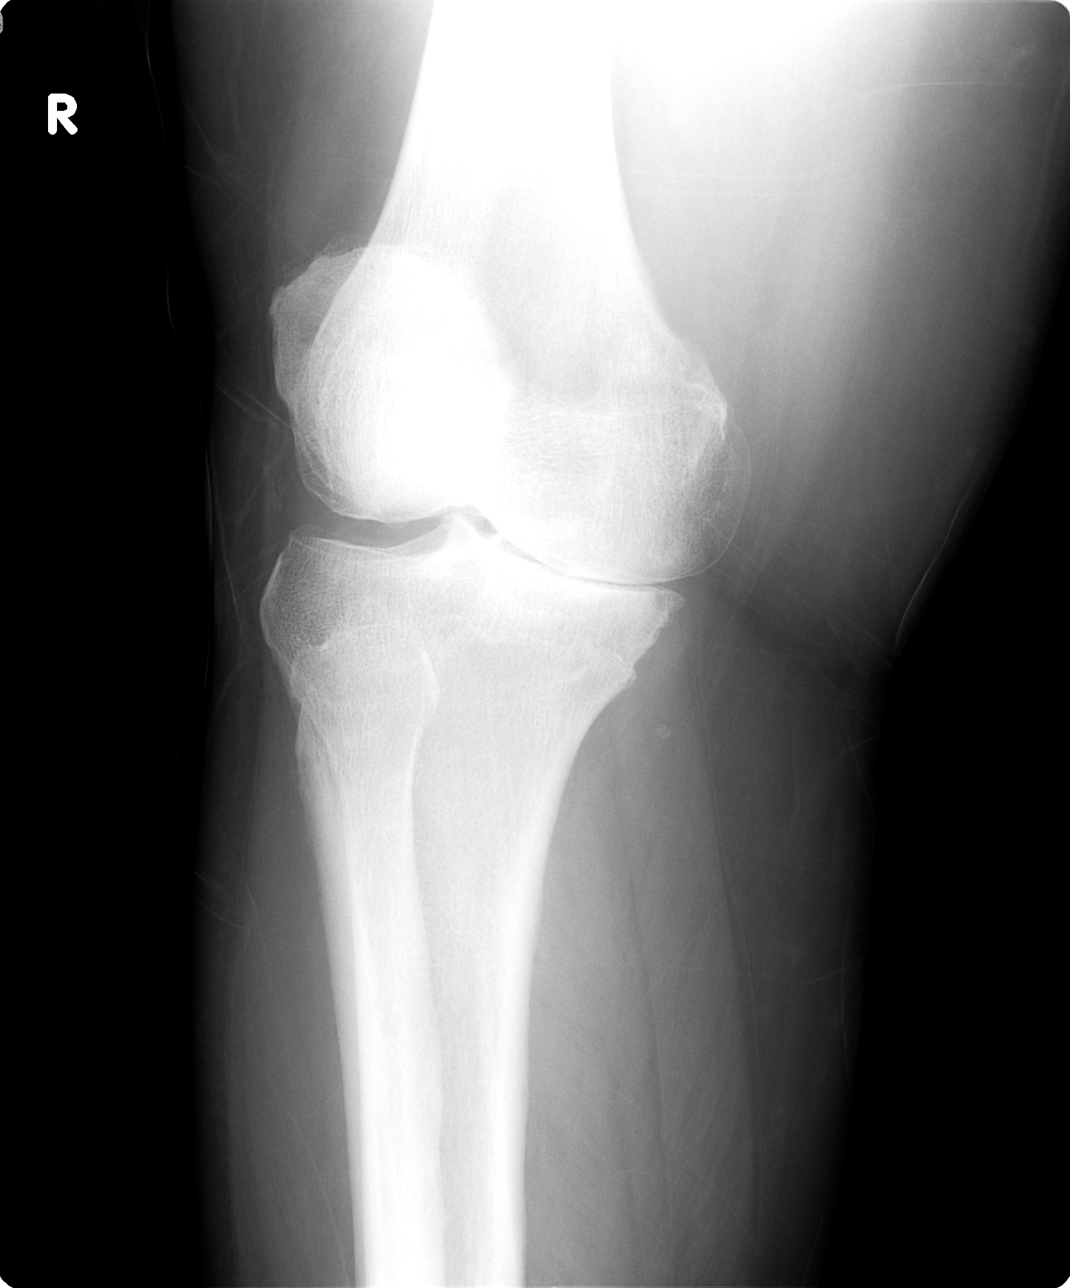

[view not recorded (4 of 4)]
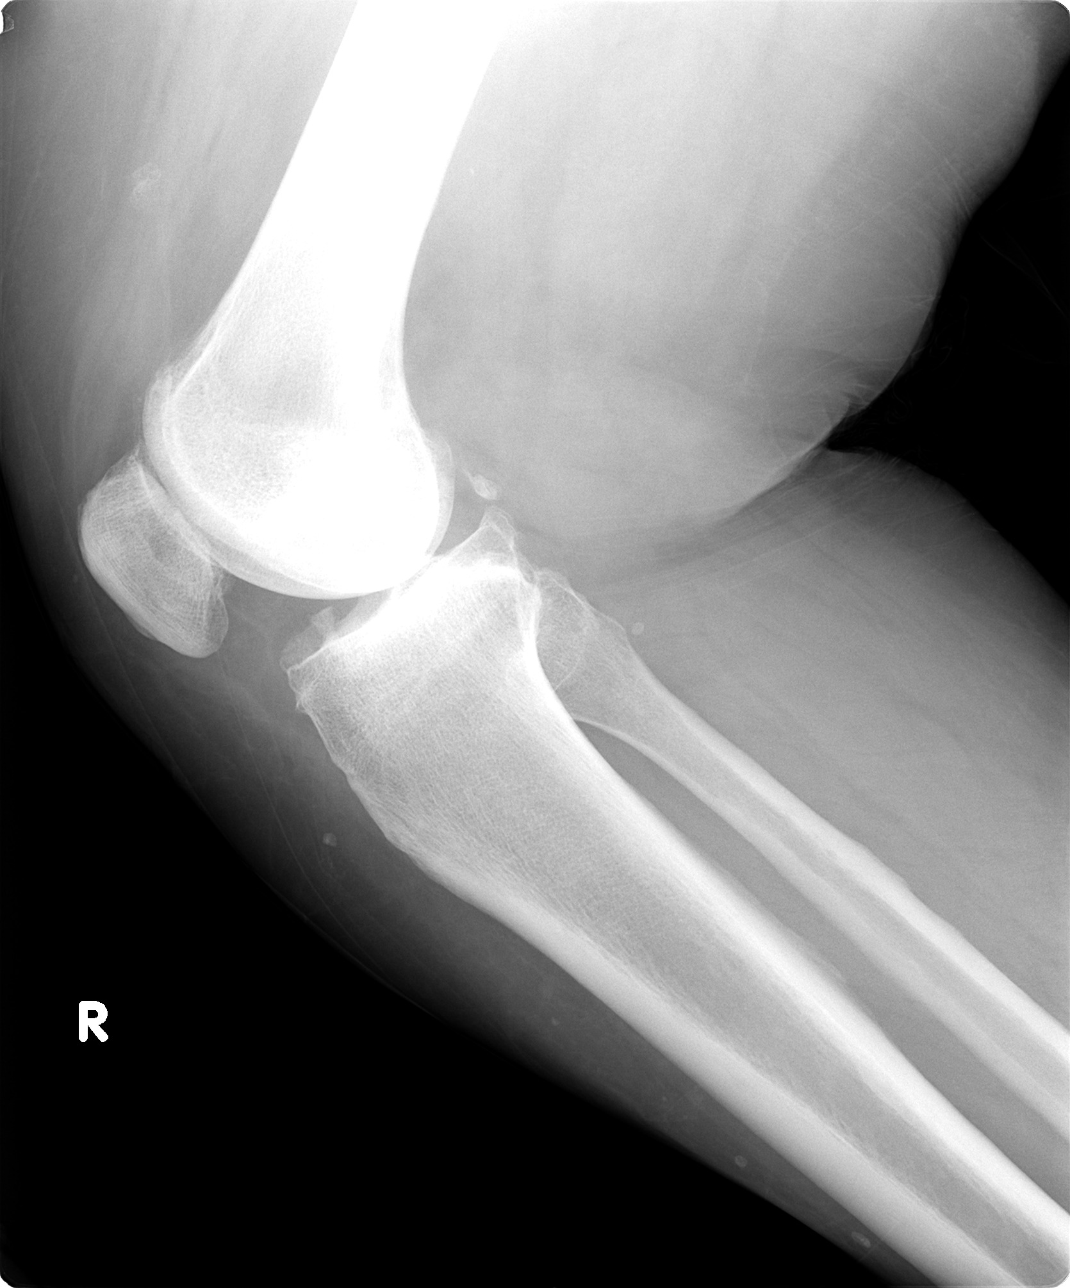

[4 of 4 positions shown; findings below may reference images not displayed]

FINDINGS: Tricompartmental osteoarthritis is noted with osteophyte
formation, mild sclerosis and joint space narrowing.  These changes
are most pronounced in the the medial compartment where there is
marked narrowing of the joint space.  No malalignment or fracture.
No large effusion.  On the lateral view, ossified densities are
present in the suprapatellar region which could represent loose
joint bodies.
IMPRESSION: Right knee osteoarthritis as described.

## 2010-07-01 ENCOUNTER — Ambulatory Visit: Payer: Self-pay | Admitting: Family Medicine

## 2010-07-01 DIAGNOSIS — R1013 Epigastric pain: Secondary | ICD-10-CM | POA: Insufficient documentation

## 2010-07-01 DIAGNOSIS — R0789 Other chest pain: Secondary | ICD-10-CM

## 2010-07-01 DIAGNOSIS — R5381 Other malaise: Secondary | ICD-10-CM | POA: Insufficient documentation

## 2010-07-01 DIAGNOSIS — R7309 Other abnormal glucose: Secondary | ICD-10-CM | POA: Insufficient documentation

## 2010-07-01 DIAGNOSIS — R109 Unspecified abdominal pain: Secondary | ICD-10-CM | POA: Insufficient documentation

## 2010-07-01 DIAGNOSIS — R5383 Other fatigue: Secondary | ICD-10-CM | POA: Insufficient documentation

## 2010-07-01 HISTORY — DX: Other chest pain: R07.89

## 2010-07-01 LAB — CONVERTED CEMR LAB
Bilirubin Urine: NEGATIVE
Blood in Urine, dipstick: NEGATIVE
Glucose, Urine, Semiquant: NEGATIVE
Ketones, urine, test strip: NEGATIVE
Nitrite: NEGATIVE
Protein, U semiquant: NEGATIVE
Specific Gravity, Urine: 1.01
Urobilinogen, UA: 0.2
pH: 5

## 2010-07-02 ENCOUNTER — Encounter: Payer: Self-pay | Admitting: Internal Medicine

## 2010-07-06 LAB — CONVERTED CEMR LAB
ALT: 34 units/L (ref 0–35)
AST: 37 units/L (ref 0–37)
Albumin: 3.8 g/dL (ref 3.5–5.2)
Alkaline Phosphatase: 43 units/L (ref 39–117)
BUN: 17 mg/dL (ref 6–23)
Basophils Absolute: 0 10*3/uL (ref 0.0–0.1)
Basophils Relative: 0.5 % (ref 0.0–3.0)
Bilirubin, Direct: 0.2 mg/dL (ref 0.0–0.3)
CO2: 27 meq/L (ref 19–32)
Calcium: 9.4 mg/dL (ref 8.4–10.5)
Chloride: 102 meq/L (ref 96–112)
Creatinine, Ser: 1.1 mg/dL (ref 0.4–1.2)
Eosinophils Absolute: 0.4 10*3/uL (ref 0.0–0.7)
Eosinophils Relative: 5.7 % — ABNORMAL HIGH (ref 0.0–5.0)
GFR calc non Af Amer: 66.27 mL/min (ref >60)
Glucose, Bld: 83 mg/dL (ref 70–99)
H Pylori IgG: POSITIVE
HCT: 42.2 % (ref 36.0–46.0)
Hemoglobin: 14.7 g/dL (ref 12.0–15.0)
Hgb A1c MFr Bld: 6.4 % (ref 4.6–6.5)
Lymphocytes Relative: 28.2 % (ref 12.0–46.0)
Lymphs Abs: 2 10*3/uL (ref 0.7–4.0)
MCHC: 34.8 g/dL (ref 30.0–36.0)
MCV: 90.2 fL (ref 78.0–100.0)
Monocytes Absolute: 0.6 10*3/uL (ref 0.1–1.0)
Monocytes Relative: 9.1 % (ref 3.0–12.0)
Neutro Abs: 3.9 10*3/uL (ref 1.4–7.7)
Neutrophils Relative %: 56.5 % (ref 43.0–77.0)
Phosphorus: 3.3 mg/dL (ref 2.3–4.6)
Platelets: 226 10*3/uL (ref 150.0–400.0)
Potassium: 3.8 meq/L (ref 3.5–5.1)
RBC: 4.68 M/uL (ref 3.87–5.11)
RDW: 13.2 % (ref 11.5–14.6)
Sodium: 138 meq/L (ref 135–145)
TSH: 1.59 microintl units/mL (ref 0.35–5.50)
Total Bilirubin: 1.1 mg/dL (ref 0.3–1.2)
Total Protein: 6.8 g/dL (ref 6.0–8.3)
WBC: 6.9 10*3/uL (ref 4.5–10.5)

## 2010-07-13 ENCOUNTER — Telehealth (INDEPENDENT_AMBULATORY_CARE_PROVIDER_SITE_OTHER): Payer: Self-pay | Admitting: *Deleted

## 2010-07-18 ENCOUNTER — Ambulatory Visit: Payer: Self-pay | Admitting: Family Medicine

## 2010-07-18 ENCOUNTER — Telehealth: Payer: Self-pay | Admitting: Internal Medicine

## 2010-07-18 DIAGNOSIS — A048 Other specified bacterial intestinal infections: Secondary | ICD-10-CM | POA: Insufficient documentation

## 2010-07-18 HISTORY — DX: Other specified bacterial intestinal infections: A04.8

## 2010-07-18 LAB — CONVERTED CEMR LAB
Bilirubin Urine: NEGATIVE
Blood in Urine, dipstick: NEGATIVE
Glucose, Urine, Semiquant: NEGATIVE
Ketones, urine, test strip: NEGATIVE
Nitrite: NEGATIVE
Protein, U semiquant: NEGATIVE
Sed Rate: 16 mm/hr (ref 0–22)
Specific Gravity, Urine: 1.01
Uric Acid, Serum: 9 mg/dL — ABNORMAL HIGH (ref 2.4–7.0)
Urobilinogen, UA: 0.2
WBC Urine, dipstick: NEGATIVE
pH: 5.5

## 2010-07-18 IMAGING — CR DG KNEE COMPLETE 4+V*R*
4 series · 4 of 4 positions shown · non-contrast
Comparison: [DATE] knee radiographs.

CLINICAL DATA: Knee pain for 2 months.  No known injury.

RIGHT KNEE - COMPLETE 4+ VIEW

[view not recorded (1 of 4)]
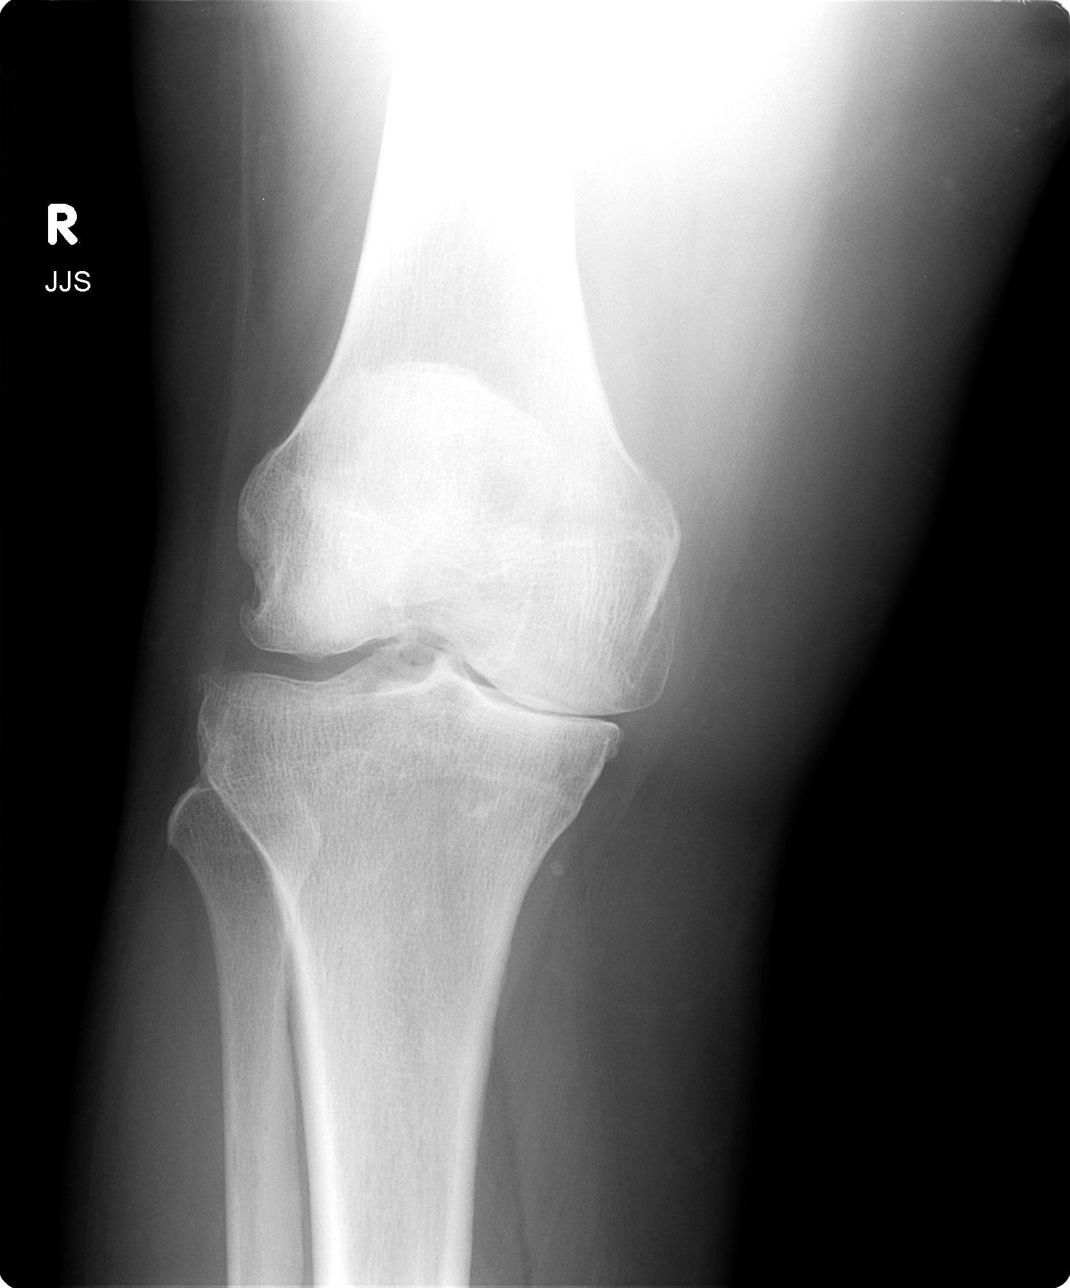

[view not recorded (2 of 4)]
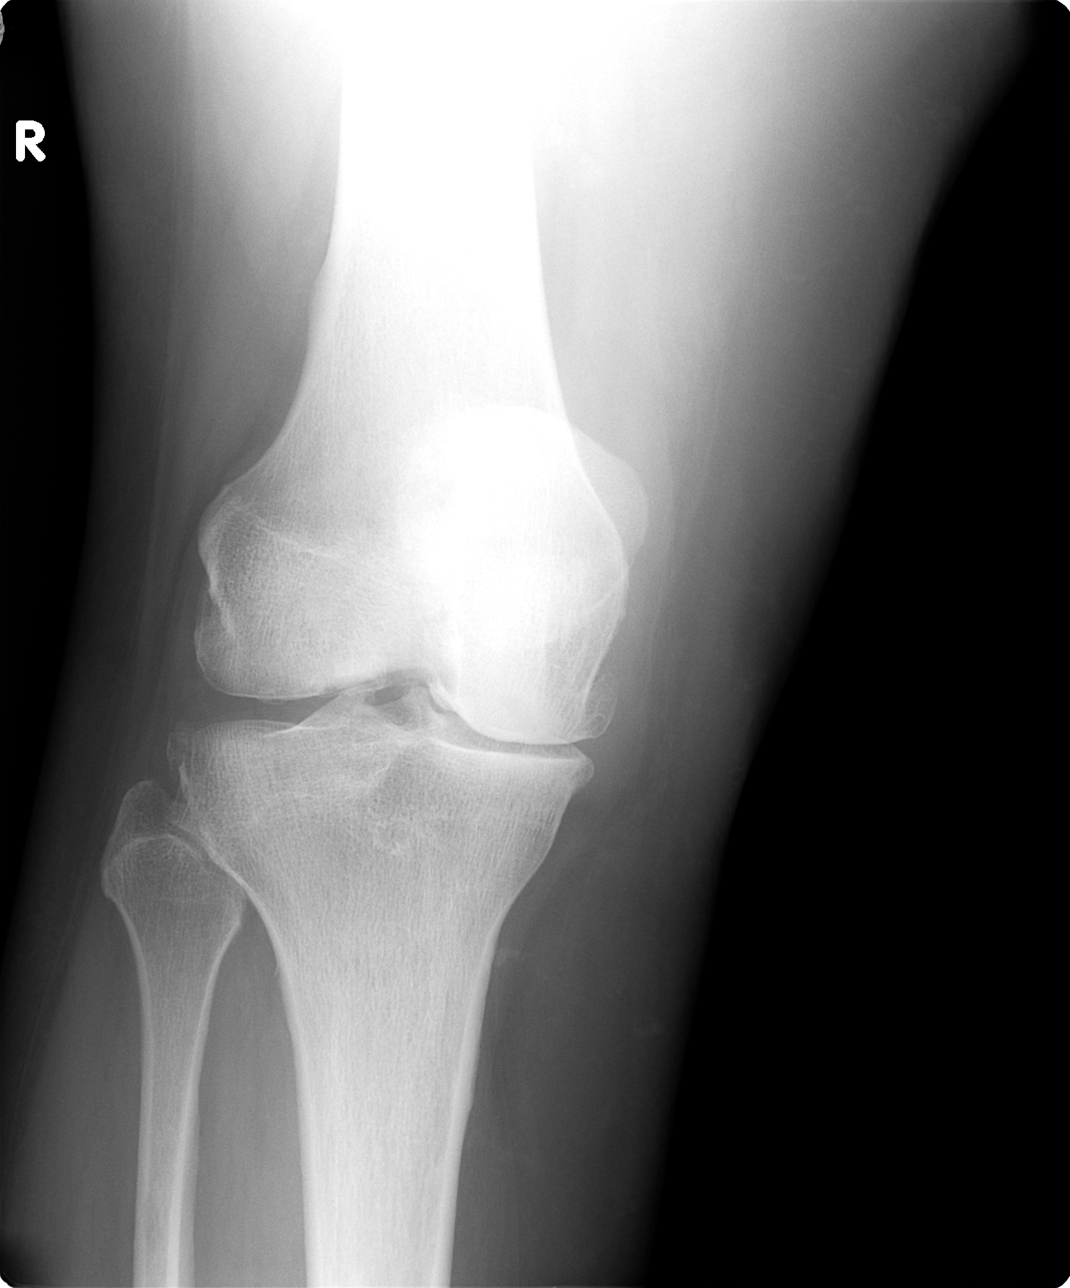

[view not recorded (3 of 4)]
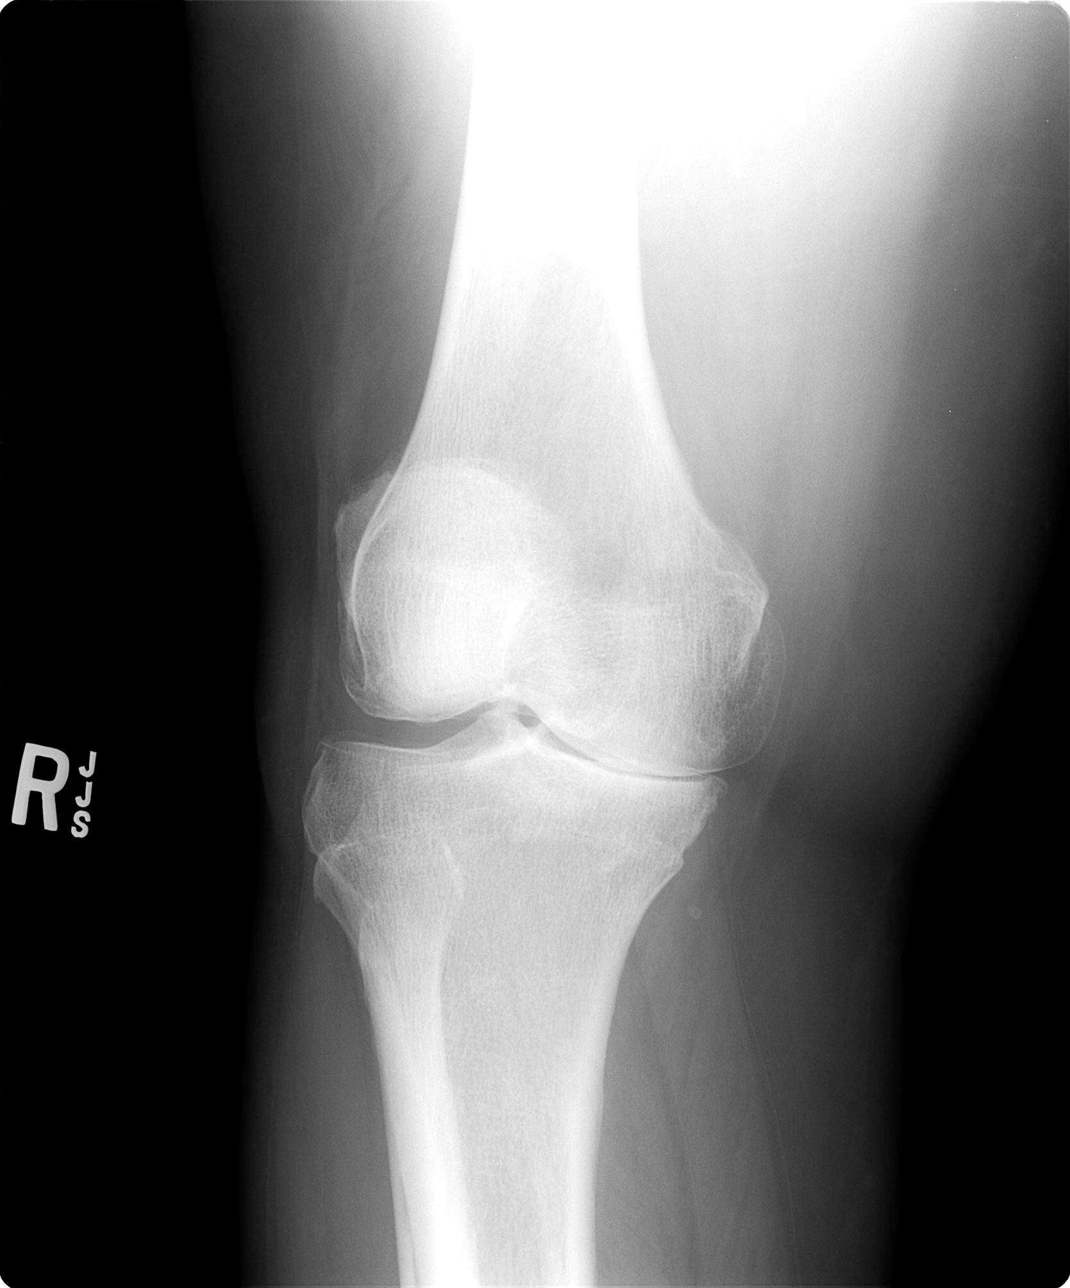

[view not recorded (4 of 4)]
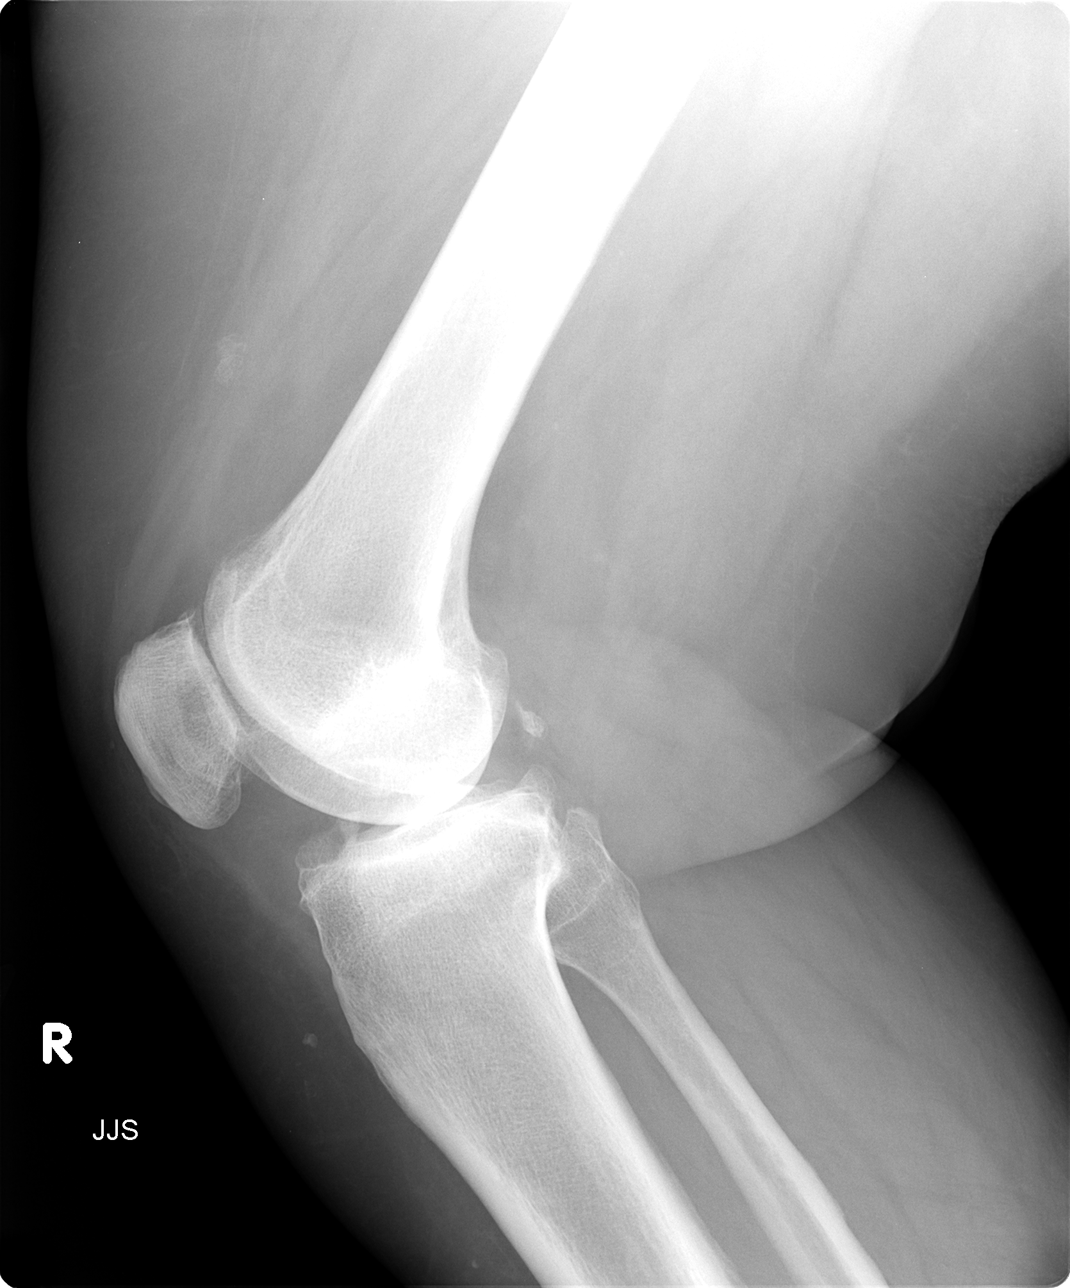

[4 of 4 positions shown; findings below may reference images not displayed]

FINDINGS: There are stable tricompartmental degenerative changes,
most advanced medially.  A probable loose body in the suprapatellar
recess is again noted.  There is no evidence of acute fracture or
dislocation.
IMPRESSION: Stable examination with tricompartmental degenerative changes and
probable loose body in the suprapatellar recess.

## 2010-07-26 ENCOUNTER — Ambulatory Visit: Payer: Self-pay | Admitting: Cardiology

## 2010-07-26 DIAGNOSIS — I447 Left bundle-branch block, unspecified: Secondary | ICD-10-CM | POA: Insufficient documentation

## 2010-08-02 ENCOUNTER — Telehealth (INDEPENDENT_AMBULATORY_CARE_PROVIDER_SITE_OTHER): Payer: Self-pay | Admitting: *Deleted

## 2010-08-03 ENCOUNTER — Encounter: Payer: Self-pay | Admitting: Cardiology

## 2010-08-04 ENCOUNTER — Ambulatory Visit: Payer: Self-pay | Admitting: Internal Medicine

## 2010-08-04 LAB — CONVERTED CEMR LAB
ALT: 38 units/L — ABNORMAL HIGH (ref 0–35)
AST: 38 units/L — ABNORMAL HIGH (ref 0–37)
Albumin: 3.6 g/dL (ref 3.5–5.2)
Alkaline Phosphatase: 43 units/L (ref 39–117)
Bilirubin, Direct: 0.1 mg/dL (ref 0.0–0.3)
Cholesterol: 214 mg/dL — ABNORMAL HIGH (ref 0–200)
Direct LDL: 145.7 mg/dL
HDL: 39.6 mg/dL (ref >39.00)
Total Bilirubin: 1 mg/dL (ref 0.3–1.2)
Total CHOL/HDL Ratio: 5
Total Protein: 6.5 g/dL (ref 6.0–8.3)
Triglycerides: 161 mg/dL — ABNORMAL HIGH (ref 0.0–149.0)
VLDL: 32.2 mg/dL (ref 0.0–40.0)

## 2010-08-09 ENCOUNTER — Ambulatory Visit (HOSPITAL_COMMUNITY): Admission: RE | Admit: 2010-08-09 | Discharge: 2010-08-09 | Payer: Self-pay | Admitting: Cardiovascular Disease

## 2010-08-09 ENCOUNTER — Ambulatory Visit: Payer: Self-pay

## 2010-08-09 ENCOUNTER — Ambulatory Visit: Payer: Self-pay | Admitting: Internal Medicine

## 2010-08-09 ENCOUNTER — Encounter: Payer: Self-pay | Admitting: Cardiovascular Disease

## 2010-08-11 ENCOUNTER — Ambulatory Visit: Payer: Self-pay | Admitting: Internal Medicine

## 2010-10-14 ENCOUNTER — Ambulatory Visit: Payer: Self-pay | Admitting: Internal Medicine

## 2010-10-14 DIAGNOSIS — G5600 Carpal tunnel syndrome, unspecified upper limb: Secondary | ICD-10-CM | POA: Insufficient documentation

## 2010-10-14 DIAGNOSIS — M25569 Pain in unspecified knee: Secondary | ICD-10-CM | POA: Insufficient documentation

## 2010-10-14 HISTORY — DX: Pain in unspecified knee: M25.569

## 2010-10-14 HISTORY — DX: Carpal tunnel syndrome, unspecified upper limb: G56.00

## 2010-11-09 ENCOUNTER — Other Ambulatory Visit: Payer: Self-pay | Admitting: Internal Medicine

## 2010-11-09 ENCOUNTER — Ambulatory Visit
Admission: RE | Admit: 2010-11-09 | Discharge: 2010-11-09 | Payer: Self-pay | Source: Home / Self Care | Attending: Internal Medicine | Admitting: Internal Medicine

## 2010-11-09 ENCOUNTER — Telehealth: Payer: Self-pay | Admitting: *Deleted

## 2010-11-09 ENCOUNTER — Encounter: Payer: Self-pay | Admitting: Internal Medicine

## 2010-11-09 DIAGNOSIS — M25559 Pain in unspecified hip: Secondary | ICD-10-CM | POA: Insufficient documentation

## 2010-11-09 HISTORY — DX: Pain in unspecified hip: M25.559

## 2010-11-09 LAB — LIPID PANEL
Cholesterol: 282 mg/dL — ABNORMAL HIGH (ref 0–200)
HDL: 47.1 mg/dL (ref >39.00)
Total CHOL/HDL Ratio: 6
Triglycerides: 233 mg/dL — ABNORMAL HIGH (ref 0.0–149.0)
VLDL: 46.6 mg/dL — ABNORMAL HIGH (ref 0.0–40.0)

## 2010-11-09 LAB — LDL CHOLESTEROL, DIRECT: Direct LDL: 206.9 mg/dL

## 2010-11-14 ENCOUNTER — Telehealth: Payer: Self-pay | Admitting: Internal Medicine

## 2010-11-19 ENCOUNTER — Other Ambulatory Visit: Payer: Self-pay | Admitting: Internal Medicine

## 2010-11-19 DIAGNOSIS — Z1239 Encounter for other screening for malignant neoplasm of breast: Secondary | ICD-10-CM

## 2010-11-20 ENCOUNTER — Encounter: Payer: Self-pay | Admitting: Obstetrics and Gynecology

## 2010-11-20 ENCOUNTER — Encounter: Payer: Self-pay | Admitting: Internal Medicine

## 2010-11-29 NOTE — Letter (Signed)
Summary: [facility]  [facility]   Imported By: Maryln Gottron 02/09/2009 14:43:24  _____________________________________________________________________  External Attachment:    Type:   Image     Comment:   External Document

## 2010-11-29 NOTE — Assessment & Plan Note (Signed)
Summary: pain in back/chest x 2 wk./njr   Vital Signs:  Patient profile:   70 year old female Height:      70 inches Weight:      266 pounds BMI:     38.30 Temp:     98.2 degrees F oral Pulse rate:   76 / minute Resp:     14 per minute BP sitting:   130 / 80  (left arm)  Vitals Entered By: Willy Eddy, LPN (August 23, 2009 12:09 PM)  Primary Care Provider:  Richardo Priest  CC:  c/o "catch: in back and rib area that starts in mid sternum and radiates around to ribs and then to back.  History of Present Illness: Has episodoic pain in chest radiating to back of pain the pain has been going on for two weeks has been out of the pepcid movement increases does not seem to change with walking increased with laying down Was lifting her mother befoe she died ( a month ago mild pill dyspahgia lipids reviewed hot flashes? pt presents for an early OV due to these issues  Cardiovascular Risk History:      Positive major cardiovascular risk factors include female age 78 years old or older, hyperlipidemia, and hypertension.  Negative major cardiovascular risk factors include non-tobacco-user status.        Further assessment for target organ damage reveals no history of ASHD, stroke/TIA, or peripheral vascular disease.    Problems Prior to Update: 1)  Back Pain, Acute  (ICD-724.5) 2)  Chest Pain, Atypical  (ICD-786.59) 3)  Cerumen Impaction  (ICD-380.4) 4)  Dysuria, Chronic  (ICD-788.1) 5)  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) 6)  Gerd  (ICD-530.81) 7)  Abdominal Pain, Left Upper and Lower  (ICD-789.09) 8)  Change in Bowels  (ICD-787.99) 9)  Fatigue  (ICD-780.79) 10)  Acute Sinusitis, Unspecified  (ICD-461.9) 11)  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) 12)  Diverticulosis, Colon  (ICD-562.10) 13)  Diverticulitis, Hx of  (ICD-V12.79) 14)  Gout, Unspecified  (ICD-274.9) 15)  Overweight  (ICD-278.02) 16)  Hyperlipidemia   (ICD-272.4) 17)  Hypertension  (ICD-401.9) 18)  Hyperlipidemia  (ICD-272.4)  Medications Prior to Update: 1)  Tenoretic 50 50-25 Mg  Tabs (Atenolol-Chlorthalidone) .... 1/2 Once Daily in The Morning 2)  Adult Aspirin Ec Low Strength 81 Mg  Tbec (Aspirin) .... Once Daily 3)  Pepcid Ac 10 Mg Tabs (Famotidine) .... Take 1 Tablet By Mouth Once A Day 4)  Lotrel 5-20 Mg Caps (Amlodipine Besy-Benazepril Hcl) .... One By Mouth Daily 5)  Trilipix 135 Mg Cpdr (Choline Fenofibrate) .... One By Mouth-Not Taking  Current Medications (verified): 1)  Tenoretic 50 50-25 Mg  Tabs (Atenolol-Chlorthalidone) .... 1/2 Once Daily in The Morning 2)  Adult Aspirin Ec Low Strength 81 Mg  Tbec (Aspirin) .... Once Daily 3)  Pepcid Ac 10 Mg Tabs (Famotidine) .... Take 1 Tablet By Mouth Once A Day 4)  Lotrel 5-20 Mg Caps (Amlodipine Besy-Benazepril Hcl) .... One By Mouth Daily 5)  Trilipix 135 Mg Cpdr (Choline Fenofibrate) .... One By Mouth-Not Taking 6)  Prevacid 15 Mg Cpdr (Lansoprazole) .... One By Mouth Bid 7)  Livalo 4 Mg Tabs (Pitavastatin Calcium) .... One By Mouth Daily  Allergies (verified): 1)  ! * Crestor  Past History:  Family History: Last updated: 06/30/2008 Family History Other cancer-Breast, Prostate Family History of Breast Cancer:  mother No FH of Colon Cancer: Family History of Prostate Cancer:  Father  Social History: Last updated: 06/30/2008 Never Smoked Alcohol use-no Drug use-no Married Patient does not get regular exercise.   Risk Factors: Exercise: no (06/30/2008)  Risk Factors: Smoking Status: never (06/03/2007)  Past medical, surgical, family and social histories (including risk factors) reviewed, and no changes noted (except as noted below).  Past Medical History: Reviewed history from 03/09/2009 and no changes required. Hyperlipidemia Hypertension Obesity Diverticulitis, hx of Diverticulosis, colon abdominal pain unspecified constipation  Past Surgical  History: Reviewed history from 01/16/2008 and no changes required. 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 Family History Other cancer-Breast, Prostate Family History of Breast Cancer:  mother No FH of Colon Cancer: Family History of Prostate Cancer: Father  Social History: Reviewed history from 06/30/2008 and no changes required. Never Smoked Alcohol use-no Drug use-no Married Patient does not get regular exercise.   Review of Systems       The patient complains of weight gain, hoarseness, chest pain, dyspnea on exertion, and peripheral edema.  The patient denies anorexia, fever, weight loss, vision loss, decreased hearing, syncope, prolonged cough, headaches, hemoptysis, abdominal pain, melena, hematochezia, severe indigestion/heartburn, hematuria, incontinence, genital sores, muscle weakness, suspicious skin lesions, transient blindness, difficulty walking, depression, unusual weight change, abnormal bleeding, enlarged lymph nodes, angioedema, and breast masses.    Physical Exam  General:  alert and overweight-appearing.   Head:  normocephalic and no abnormalities palpated.   Eyes:  pupils equal and pupils reactive to light.   Ears:  R ear normal and L ear normal.   Nose:  no external deformity and no nasal discharge.   Neck:  No deformities, masses, or tenderness noted. Lungs:  normal respiratory effort and no wheezes.   Heart:  normal rate and regular rhythm.   Abdomen:  soft, no guarding, and epigastric tenderness.   Msk:  no joint swelling and no joint warmth.   Extremities:  trace left pedal edema and 1+ right pedal edema.   Neurologic:  alert & oriented X3 and DTRs symmetrical and normal.     Impression & Recommendations:  Problem # 1:  CHEST  PAIN, ATYPICAL (ICD-786.59) Assessment New the most probsble etiology is GI but due to her many risks we will onbtan cardiac labs and continue to monter closely this a  appeares to be non cardiac  Orders: Venipuncture (16109) TLB-Cardiac Panel ([id number])  Problem # 2:  BACK PAIN, ACUTE (ICD-724.5) Assessment: Deteriorated  trial of sample of a muslce relaxer without NSAIDs  ( dolgic samples two times a day) Her updated medication list for this problem includes:    Adult Aspirin Ec Low Strength 81 Mg Tbec (Aspirin) ..... Once daily  Discussed use of moist heat or ice, modified activities, medications, and stretching/strengthening exercises. Back care instructions given. To be seen in 2 weeks if no improvement; sooner if worsening of symptoms.   Problem # 3:  GERD (ICD-530.81) Assessment: Deteriorated  Her updated medication list for this problem includes:    Pepcid Ac 10 Mg Tabs (Famotidine) .Marland Kitchen... Take 1 tablet by mouth once a day    Prevacid 15 Mg Cpdr (Lansoprazole) ..... One by mouth bid  Orders: TLB-CBC Platelet - w/Differential ([id number])  Labs Reviewed: Hgb: 13.8 (06/30/2008)   Hct: 38.9 (06/30/2008)  Problem # 4:  HYPERLIPIDEMIA (ICD-272.4) l Her updated medication list for this problem includes:  Trilipix 135 Mg Cpdr (Choline fenofibrate) ..... One by mouth-not taking    Livalo 4 Mg Tabs (Pitavastatin calcium) ..... One by mouth daily  Labs Reviewed: SGOT: 32 (06/15/2009)   SGPT: 25 (06/15/2009)  Lipid Goals: Chol Goal: 200 (02/13/2008)   HDL Goal: 40 (02/13/2008)   LDL Goal: 130 (02/13/2008)   TG Goal: 150 (02/13/2008)  10 Yr Risk Heart Disease: 13 % Prior 10 Yr Risk Heart Disease: 17 % (03/09/2009)   HDL:38.20 (06/15/2009), 37.6 (09/04/2008)  LDL:114 (09/04/2008), DEL (05/29/2008)  Chol:225 (06/15/2009), 176 (09/04/2008)  Trig:123 (09/04/2008), 122 (05/29/2008)  Complete Medication List: 1)  Tenoretic 50 50-25 Mg Tabs (Atenolol-chlorthalidone) ....  1/2 once daily in the morning 2)  Adult Aspirin Ec Low Strength 81 Mg Tbec (Aspirin) .... Once daily 3)  Pepcid Ac 10 Mg Tabs (Famotidine) .... Take 1 tablet by mouth once a day 4)  Lotrel 5-20 Mg Caps (Amlodipine besy-benazepril hcl) .... One by mouth daily 5)  Trilipix 135 Mg Cpdr (Choline fenofibrate) .... One by mouth-not taking 6)  Prevacid 15 Mg Cpdr (Lansoprazole) .... One by mouth bid 7)  Livalo 4 Mg Tabs (Pitavastatin calcium) .... One by mouth daily  Cardiovascular Risk Assessment/Plan:      The patient's hypertensive risk group is category B: At least one risk factor (excluding diabetes) with no target organ damage.  Her calculated 10 year risk of coronary heart disease is 13 %.  Today's blood pressure is 130/80.  Her blood pressure goal is < 140/90.   Patient Instructions: 1)  Take the prevacid 15 mg two times a day 2)  ( see you Nov 18) 3)  keep pn the trilipix  take the livalo  every monday night!!!! for the cholestesterol 4)  for the back spasm  dolgic  one twice as day as needed for the back spasm

## 2010-11-29 NOTE — Progress Notes (Signed)
Summary: gas/pain  Phone Note Call from Patient Call back at Home Phone [phone] Call back at c [phone]   Caller: live Call For: [name] Complaint: Cough/Sore throat Summary of Call: Requests ov check with Dr. Shela Commons for gaslike pain left side where ureter was cut during colon surgery 2 yrs ago.  Area under left breast swollen.  Woke during night with it.  BP was up190/90 now 148/80, was short of breath, now better.  Took prune juice to blow out gas.  Could be divertic, that's how it feels, like insides of colon hanging, not like a prolapse, the old timey kind of hanging out.   Hasn't checked temperature.  [facility].  NKDA.    Initial call taken by: Rudy Jew, RN,  November 12, 2009 10:28 AM  Follow-up for Phone Call        per dr Colleen Can abd series at [facility] today- if she starts running fever or sever abd pain go to er and make appointment with dr Zachery Dauer Follow-up by: Willy Eddy, LPN,  November 12, 2009 12:15 PM  Additional Follow-up for Phone Call Additional follow up Details #1::        appointment with dr Leone Payor 9:15 on monday- and per xray gas pasttern- per dr Lovell Sheehan clear liquid diet until she sees dr Leone Payor on monday- pt aware Additional Follow-up by: Willy Eddy, LPN,  November 12, 2009 1:27 PM

## 2010-11-29 NOTE — Assessment & Plan Note (Signed)
Summary: [name]   Vital Signs:  Patient Profile:   70 Years Old Female Height:     70 inches Weight:      250 pounds Temp:     98.2 degrees F oral Pulse rate:   76 / minute Resp:     14 per minute BP sitting:   140 / 80  (left arm) Cuff size:   large  Vitals Entered By: Willy Eddy, LPN (February 13, 2008 12:11 PM)                 Chief Complaint:  c/onasal and chest congestion with slight cough.  History of Present Illness: Had URI and pressure soreness in chest today better was able to cough up phlem this AM ( white/creams color)  Hypertension History:      She denies headache, chest pain, palpitations, dyspnea with exertion, orthopnea, PND, peripheral edema, visual symptoms, neurologic problems, syncope, and side effects from treatment.        Positive major cardiovascular risk factors include female age 21 years old or older, hyperlipidemia, and hypertension.  Negative major cardiovascular risk factors include non-tobacco-user status.    Lipid Management History:      Positive NCEP/ATP III risk factors include female age 88 years old or older and hypertension.  Negative NCEP/ATP III risk factors include non-tobacco-user status.        Current Allergies: No known allergies   Past Medical History:    Reviewed history from 09/09/2007 and no changes required:       Hyperlipidemia       Hypertension       Obesity       Diverticulitis, hx of       Diverticulosis, colon  Past Surgical History:    Reviewed history from 01/16/2008 and no changes required:       S/P skin graft for trauma       Hysterectomy       Bladder tack       Colonoscopy-2001, 2005       Oophorectomy       ureter repair  for transected left ureter       temporary stent       Colectomy for sigmoid diverticulosis   Family History:    Reviewed history from 06/03/2007 and no changes required:       Family History Other cancer-Breast, Prostate  Social History:    Reviewed history from  06/03/2007 and no changes required:       Never Smoked       Alcohol use-no       Drug use-no       Married    Review of Systems       The patient complains of hoarseness, dyspnea on exhertion, and prolonged cough.  The patient denies anorexia, fever, weight loss, weight gain, vision loss, decreased hearing, chest pain, syncope, peripheral edema, hemoptysis, abdominal pain, melena, hematochezia, severe indigestion/heartburn, hematuria, incontinence, genital sores, muscle weakness, suspicious skin lesions, transient blindness, difficulty walking, depression, unusual weight change, abnormal bleeding, enlarged lymph nodes, angioedema, and breast masses.     Physical Exam  General:     overweight-appearing.  alert.   Head:     Normocephalic and atraumatic without obvious abnormalities. No apparent alopecia or balding. Nose:     External nasal examination shows no deformity or inflammation. Nasal mucosa are pink and moist without lesions or exudates. Mouth:     Oral mucosa and oropharynx without lesions or exudates.  Teeth in good repair. Neck:     No deformities, masses, or tenderness noted. Lungs:     normal respiratory effort, no accessory muscle use, and normal breath sounds.   Heart:     normal rate and regular rhythm.     Knee Exam  Palpation:    tenderness L-medial joint line, tenderness L-lateral joint line, and tenderness L-Parapatellar.      Impression & Recommendations:  Problem # 1:  HYPERLIPIDEMIA (ICD-272.4)  Her updated medication list for this problem includes:    Crestor 5 Mg Tabs (Rosuvastatin calcium) ..... One by mouth daily  Labs Reviewed: Chol: 270 (12/04/2007)   HDL: 42.0 (12/04/2007)   LDL: DEL (12/04/2007)   TG: 141 (12/04/2007) SGOT: 25 (12/04/2007)   SGPT: 26 (12/04/2007)  Lipid Goals: Chol Goal: 200 (02/13/2008)   HDL Goal: 40 (02/13/2008)   LDL Goal: 130 (02/13/2008)   TG Goal: 150 (02/13/2008)  Prior 10 Yr Risk Heart Disease: Not enough  information (10/02/2007)   Problem # 2:  HYPERLIPIDEMIA (ICD-272.4)  Her updated medication list for this problem includes:    Crestor 5 Mg Tabs (Rosuvastatin calcium) ..... One by mouth daily  Labs Reviewed: Chol: 270 (12/04/2007)   HDL: 42.0 (12/04/2007)   LDL: DEL (12/04/2007)   TG: 141 (12/04/2007) SGOT: 25 (12/04/2007)   SGPT: 26 (12/04/2007)  Lipid Goals: Chol Goal: 200 (02/13/2008)   HDL Goal: 40 (02/13/2008)   LDL Goal: 130 (02/13/2008)   TG Goal: 150 (02/13/2008)  Prior 10 Yr Risk Heart Disease: Not enough information (10/02/2007)   Problem # 3:  LOC OSTEOARTHROS NOT SPEC PRIM/SEC LOWER LEG (ICD-715.36) Discussed use of medications, application of heat or cold, and exercises.  Orders: Physical Therapy Referral (PT)   Complete Medication List: 1)  Lotrel 5-10 Mg Caps (Amlodipine besy-benazepril hcl) .... Once daily 2)  Tenoretic 50 50-25 Mg Tabs (Atenolol-chlorthalidone) .Marland Kitchen.. 1 once daily 3)  Crestor 5 Mg Tabs (Rosuvastatin calcium) .... One by mouth daily  Hypertension Assessment/Plan:      The patient's hypertensive risk group is category B: At least one risk factor (excluding diabetes) with no target organ damage.  Today's blood pressure is 140/80.  Her blood pressure goal is < 140/90.  Lipid Assessment/Plan:      Based on NCEP/ATP III, the patient's risk factor category is "2 or more risk factors and a calculated 10 year CAD risk of < 20%".  From this information, the patient's calculated lipid goals are as follows: Total cholesterol goal is 200; LDL cholesterol goal is 130; HDL cholesterol goal is 40; Triglyceride goal is 150.     Patient Instructions: 1)  Please schedule a follow-up appointment in 4 weeks 2)  Hepatic Panel prior to visit, ICD-9: 995.20  3 weeks 3)  Lipid Panel prior to visit, ICD-9:272.4 4)  GIVE PT CARD    ]

## 2010-11-29 NOTE — Progress Notes (Signed)
Summary: generic med questions  Phone Note Call from Patient   Caller: Patient Call For: Dr. Lovell Sheehan Reason for Call: Talk to Doctor Summary of Call: Pt states that generic Lotrel is not on the 4.00 list at [facility] and would like another suggestion. [phone] Initial call taken by: Lynann Beaver CMA,  June 19, 2008 3:25 PM  Follow-up for Phone Call        appointment given for this pm Follow-up by: Willy Eddy, LPN,  June 22, 2008 7:53 AM      Appended Document: generic med questions ANSWERED IN ERROR- WRITTEN IN AS WRONG CHART--NO APPOINTMENT WAS GIVEN --STILL NEED TO ANSWER QUESTION ABOUT LOTREL

## 2010-11-29 NOTE — Letter (Signed)
Summary: Patient Notice- Polyp Results  [facility]  [address] [place], [place] [address]   Phone: [phone]  Fax: [phone]        August 28, 2008 MRN: [id number]    [name] [address] [place], [place]  [address]    Dear Ms. Sandhu,  The polyps removed from your colon were adenomatous. This means that they were pre-cancerous or that  they had the potential to change into cancer over time.  I recommend that you have a repeat colonoscopy in 5 years to determine if you have developed any new polyps over time. If you develop any new rectal bleeding, abdominal pain or significant bowel habit changes, please contact us before then.     Sincerely,  Iva Boop MD  This letter has been electronically signed by your physician.

## 2010-11-29 NOTE — Assessment & Plan Note (Signed)
Summary: 3 MO ROV   Vital Signs:  Patient profile:   70 year old female Height:      70 inches Weight:      266 pounds BMI:     38.30 Temp:     98.1 degrees F oral Pulse rate:   76 / minute Resp:     14 per minute BP sitting:   116 / 78  (left arm) Cuff size:   large  Vitals Entered By: Willy Eddy, LPN (September 16, 2009 11:29 AM)  Contraindications/Deferment of Procedures/Staging:    Test/Procedure: FLU VAX    Reason for deferment: patient declined   Primary Care Provider:  Richardo Priest  CC:  roa-- thinks she only takes livalo on monday.  History of Present Illness: monitering the nedd medicatin pulse livalo 4 mg a week chronic obesity HTN and  chronic back pain  Hypertension History:      She denies headache, chest pain, palpitations, dyspnea with exertion, orthopnea, PND, peripheral edema, visual symptoms, neurologic problems, syncope, and side effects from treatment.        Positive major cardiovascular risk factors include female age 4 years old or older, hyperlipidemia, and hypertension.  Negative major cardiovascular risk factors include non-tobacco-user status.        Further assessment for target organ damage reveals no history of ASHD, stroke/TIA, or peripheral vascular disease.     Problems Prior to Update: 1)  Back Pain, Acute  (ICD-724.5) 2)  Chest Pain, Atypical  (ICD-786.59) 3)  Cerumen Impaction  (ICD-380.4) 4)  Dysuria, Chronic  (ICD-788.1) 5)  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) 6)  Gerd  (ICD-530.81) 7)  Abdominal Pain, Left Upper and Lower  (ICD-789.09) 8)  Change in Bowels  (ICD-787.99) 9)  Fatigue  (ICD-780.79) 10)  Acute Sinusitis, Unspecified  (ICD-461.9) 11)  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) 12)  Diverticulosis, Colon  (ICD-562.10) 13)  Diverticulitis, Hx of  (ICD-V12.79) 14)  Gout, Unspecified  (ICD-274.9) 15)  Overweight  (ICD-278.02) 16)  Hyperlipidemia  (ICD-272.4) 17)   Hypertension  (ICD-401.9) 18)  Hyperlipidemia  (ICD-272.4)  Medications Prior to Update: 1)  Tenoretic 50 50-25 Mg  Tabs (Atenolol-Chlorthalidone) .... 1/2 Once Daily in The Morning 2)  Adult Aspirin Ec Low Strength 81 Mg  Tbec (Aspirin) .... Once Daily 3)  Pepcid Ac 10 Mg Tabs (Famotidine) .... Take 1 Tablet By Mouth Once A Day 4)  Lotrel 5-20 Mg Caps (Amlodipine Besy-Benazepril Hcl) .... One By Mouth Daily 5)  Trilipix 135 Mg Cpdr (Choline Fenofibrate) .... One By Mouth-Not Taking 6)  Prevacid 15 Mg Cpdr (Lansoprazole) .... One By Mouth Bid 7)  Livalo 4 Mg Tabs (Pitavastatin Calcium) .... One By Mouth Daily  Current Medications (verified): 1)  Tenoretic 50 50-25 Mg  Tabs (Atenolol-Chlorthalidone) .... 1/2 Once Daily in The Morning 2)  Adult Aspirin Ec Low Strength 81 Mg  Tbec (Aspirin) .... Once Daily 3)  Lotrel 5-20 Mg Caps (Amlodipine Besy-Benazepril Hcl) .... One By Mouth Daily 4)  Prevacid 15 Mg Cpdr (Lansoprazole) .... One By Mouth Bid 5)  Livalo 4 Mg Tabs (Pitavastatin Calcium) .Marland Kitchen.. 1 On Mondays  Allergies (verified): 1)  ! * Crestor  Past History:  Family History: Last updated: 06/30/2008 Family History Other cancer-Breast, Prostate Family History of Breast Cancer:  mother No FH of Colon Cancer: Family History of Prostate Cancer: Father  Social History: Last updated: 06/30/2008 Never Smoked Alcohol use-no Drug use-no Married Patient does  not get regular exercise.   Risk Factors: Exercise: no (06/30/2008)  Risk Factors: Smoking Status: never (06/03/2007)  Past medical, surgical, family and social histories (including risk factors) reviewed, and no changes noted (except as noted below).  Past Medical History: Reviewed history from 03/09/2009 and no changes required. Hyperlipidemia Hypertension Obesity Diverticulitis, hx of Diverticulosis, colon abdominal pain unspecified constipation  Past Surgical History: Reviewed history from 01/16/2008 and no  changes required. 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 Family History Other cancer-Breast, Prostate Family History of Breast Cancer:  mother No FH of Colon Cancer: Family History of Prostate Cancer: Father  Social History: Reviewed history from 06/30/2008 and no changes required. Never Smoked Alcohol use-no Drug use-no Married Patient does not get regular exercise.   Review of Systems  The patient denies anorexia, fever, weight loss, weight gain, vision loss, decreased hearing, hoarseness, chest pain, syncope, dyspnea on exertion, peripheral edema, prolonged cough, headaches, hemoptysis, abdominal pain, melena, hematochezia, severe indigestion/heartburn, hematuria, incontinence, genital sores, muscle weakness, suspicious skin lesions, transient blindness, difficulty walking, depression, unusual weight change, abnormal bleeding, enlarged lymph nodes, angioedema, and breast masses.    Physical Exam  General:  alert and overweight-appearing.   Head:  normocephalic and no abnormalities palpated.   Eyes:  pupils equal and pupils reactive to light.   Ears:  R ear normal and L ear normal.   Nose:  no external deformity and no nasal discharge.   Mouth:  pharynx pink and moist and no erythema.   Neck:  No deformities, masses, or tenderness noted. Lungs:  normal respiratory effort and no wheezes.   Heart:  normal rate and regular rhythm.   Abdomen:  soft, no guarding, and epigastric tenderness.   Msk:  no joint swelling and no joint warmth.   Extremities:  trace left pedal edema and 1+ right pedal edema.   Neurologic:  alert & oriented X3 and finger-to-nose normal.     Impression & Recommendations:  Problem # 1:  CHEST PAIN, ATYPICAL (ICD-786.59) resolved with  the i nitial of the prevacid  Problem # 2:  GERD (ICD-530.81)  The following medications were removed from the medication list:    Pepcid Ac 10 Mg Tabs (Famotidine) .Marland Kitchen... Take 1 tablet by mouth once a day Her updated medication list for this problem includes:    Prevacid 15 Mg Cpdr (Lansoprazole) ..... One by mouth bid  Labs Reviewed: Hgb: 14.0 (08/23/2009)   Hct: 40.7 (08/23/2009)  Problem # 3:  HYPERTENSION (ICD-401.9)  Her updated medication list for this problem includes:    Tenoretic 50 50-25 Mg Tabs (Atenolol-chlorthalidone) .Marland Kitchen... 1/2 once daily in the morning    Lotrel 5-20 Mg Caps (Amlodipine besy-benazepril hcl) ..... One by mouth daily  BP today: 116/78 Prior BP: 130/80 (08/23/2009)  Prior 10 Yr Risk Heart Disease: 13 % (08/23/2009)  Labs Reviewed: K+: 4.0 (09/04/2008) Creat: : 1.1 (09/04/2008)   Chol: 225 (06/15/2009)   HDL: 38.20 (06/15/2009)   LDL: 114 (09/04/2008)   TG: 123 (09/04/2008)  Problem # 4:  HYPERLIPIDEMIA (ICD-272.4) Assessment: Unchanged moniter the trial of pulse medication The following medications were removed from the medication list:    Trilipix 135 Mg Cpdr (Choline fenofibrate) ..... One by mouth-not taking Her updated medication list for this problem includes:    Livalo 4 Mg Tabs (Pitavastatin calcium) .Marland KitchenMarland KitchenMarland KitchenMarland Kitchen  1 on mondays  Labs Reviewed: SGOT: 32 (06/15/2009)   SGPT: 25 (06/15/2009)  Lipid Goals: Chol Goal: 200 (02/13/2008)   HDL Goal: 40 (02/13/2008)   LDL Goal: 130 (02/13/2008)   TG Goal: 150 (02/13/2008)  Prior 10 Yr Risk Heart Disease: 13 % (08/23/2009)   HDL:38.20 (06/15/2009), 37.6 (09/04/2008)  LDL:114 (09/04/2008), DEL (05/29/2008)  Chol:225 (06/15/2009), 176 (09/04/2008)  Trig:123 (09/04/2008), 122 (05/29/2008)  Orders: Venipuncture (16109) TLB-Cholesterol, HDL (83718-HDL) TLB-Cholesterol, Direct LDL (83721-DIRLDL) TLB-Cholesterol, Total (82465-CHO)  Problem # 5:  FATIGUE (ICD-780.79) cannot work due to the  [employer]  Complete Medication List: 1)  Tenoretic 50 50-25 Mg Tabs (Atenolol-chlorthalidone) .... 1/2 once daily in the morning 2)  Adult Aspirin Ec Low Strength 81 Mg Tbec (Aspirin) .... Once daily 3)  Lotrel 5-20 Mg Caps (Amlodipine besy-benazepril hcl) .... One by mouth daily 4)  Prevacid 15 Mg Cpdr (Lansoprazole) .... One by mouth bid 5)  Livalo 4 Mg Tabs (Pitavastatin calcium) .Marland Kitchen.. 1 on mondays  Hypertension Assessment/Plan:      The patient's hypertensive risk group is category B: At least one risk factor (excluding diabetes) with no target organ damage.  Her calculated 10 year risk of coronary heart disease is 8 %.  Today's blood pressure is 116/78.  Her blood pressure goal is < 140/90.  Patient Instructions: 1)  Please schedule a follow-up appointment in 3 months. 2)  Hepatic Panel prior to visit, ICD-9:  995.20 3)  Lipid Panel prior to visit, ICD-9:272.4

## 2010-11-29 NOTE — Progress Notes (Signed)
Summary: elevated BP  Phone Note Call from Patient   Caller: Patient Call For: Dr. Lovell Sheehan  Follow-up for Phone Call        keep the appointment tomorrow  Pt. instucted to keep appt tomorrow. ..................................................................Marland KitchenLynann Beaver [facility]  November 25, 2007 1:25 PM Follow-up by: [name] MD,  November 25, 2007 1:22 PM    Pt. had partial colon removal on 10/16/2007.  Her Lotrel and Atenolol had been controlling her BP until recently.  Her BP went up to 177/95, but she doubled up on both meds and brought it cown to 135/69.  Wants to see you and scheduled appt tomorrow, but is anxious and confused?  Wants to be seen today if possible.  She will call the surgeon about her gas symptoms. [phone] ..................................................................Marland KitchenLynann Beaver CMA  November 25, 2007 1:25 PM

## 2010-11-29 NOTE — Progress Notes (Signed)
Summary: please advise  Phone Note Call from Patient Call back at Home Phone [phone] Call back at [phone]   Caller: Patient---live call Reason for Call: Talk to Nurse Summary of Call: Finished Abx. Still having the burning sensation. Needs to know should she be tested again? Please return her call. Initial call taken by: Warnell Forester,  January 10, 2010 1:05 PM  Follow-up for Phone Call        Pt got better after finishing Cipro, but dysuria came back yesterday. Follow-up by: Lynann Beaver CMA,  January 10, 2010 1:29 PM  Additional Follow-up for Phone Call Additional follow up Details #1::        take addition 5 days for cipro 250 two times a day fr 5 days Additional Follow-up by: [name] MD,  January 10, 2010 1:35 PM    Prescriptions: CIPRO 250 MG TABS (CIPROFLOXACIN HCL) one by mouth two times a day x 5 days  #10 x 0   Entered by:   Lynann Beaver CMA   Authorized by:   [name] MD   Signed by:   Lynann Beaver CMA on 01/10/2010   Method used:   Electronically to        [facility] (retail)       [address]       [place], [place]  [address]       Ph: [phone]       Fax: [phone]   RxID:   [id number]  Pt. notified.

## 2010-11-29 NOTE — Progress Notes (Signed)
Summary: lab results  Phone Note Call from Patient   Caller: Patient Call For: Dr. Lovell Sheehan Reason for Call: Talk to Doctor Summary of Call: Pt would like lab results. [phone] Initial call taken by: Lynann Beaver CMA,  Mar 25, 2008 9:22 AM  Follow-up for Phone Call        pt informed that all labs were wnl except for ldl was elevated some and she will work on that and continue her cholesterol medicine Follow-up by: Willy Eddy, LPN,  Mar 25, 2008 10:24 AM

## 2010-11-29 NOTE — Progress Notes (Signed)
----   Converted from flag ---- ---- 09/10/2007 12:55 PM, [name] MD wrote: uric acid was high confirming gout ------------------------------

## 2010-11-29 NOTE — Assessment & Plan Note (Signed)
Summary: 2 MONTH ROA/JLS   Vital Signs:  Patient Profile:   70 Years Old Female Height:     70 inches Weight:      276 pounds Temp:     98.1 degrees F rectal Pulse rate:   80 / minute BP sitting:   136 / 88  (right arm) Cuff size:   large  Vitals Entered By: Willy Eddy, LPN (June 12, 2007 10:59 AM)               Chief Complaint:  f/ulotrel is now 2/10 after being filled by gyn/HIs error?.  History of Present Illness:  Hyperlipidemia Follow-Up      This is a 70 year old woman who presents for Hyperlipidemia follow-up.  did not take lipid meds.  The patient denies muscle aches, GI upset, abdominal pain, flushing, itching, constipation, diarrhea, and fatigue.  The patient denies the following symptoms: chest pain/pressure, exercise intolerance, dypsnea, palpitations, syncope, and pedal edema.  Compliance with medications (by patient report) has been poor.  Dietary compliance has been poor.  The patient reports exercising occasionally.    Hypertension Follow-Up      The patient also presents for Hypertension follow-up.  On lotrel for HTN.  The patient denies lightheadedness.  The patient denies the following associated symptoms: chest pain, chest pressure, and exercise intolerance.  Compliance with medications (by patient report) has been near 100%.  The patient reports that dietary compliance has been good.  The patient reports exercising occasionally.      Past Medical History:    Reviewed history from 06/03/2007 and no changes required:       Hyperlipidemia       Hypertension       Obesity  Past Surgical History:    Reviewed history from 06/03/2007 and no changes required:       S/P skin graft for trauma       Hysterectomy       Bladder tack       Colonoscopy-2001       Oophorectomy   Family History:    Reviewed history from 06/03/2007 and no changes required:       Family History Other cancer-Breast, Prostate  Social History:    Reviewed history from  06/03/2007 and no changes required:       Never Smoked       Alcohol use-no       Drug use-no       Married    Review of Systems  The patient denies weight loss, hoarseness, chest pain, syncope, peripheral edema, abdominal pain, melena, and severe indigestion/heartburn.         Memory loss, medical noncompliance   Physical Exam  General:     morbid obesisty with recent weight gain Head:     Normocephalic and atraumatic without obvious abnormalities. No apparent alopecia or balding. Eyes:     No corneal or conjunctival inflammation noted. EOMI. Perrla. Funduscopic exam benign, without hemorrhages, exudates or papilledema. Vision grossly normal. Nose:     External nasal examination shows no deformity or inflammation. Nasal mucosa are pink and moist without lesions or exudates. Mouth:     Oral mucosa and oropharynx without lesions or exudates.  Teeth in good repair. Neck:     supple.   Lungs:     normal respiratory effort, normal breath sounds, and no dullness.   Heart:     normal rate and regular rhythm.   Abdomen:     obesesoft,  non-tender, and normal bowel sounds.   Msk:     joint tenderness.   Psych:     Oriented X3 and not depressed appearing.     Knee Exam  General:    obese.    Palpation:    tenderness R-Parapatellar and tenderness L-Parapatellar.      Impression & Recommendations:  Problem # 1:  HYPERLIPIDEMIA (ICD-272.4)  Her updated medication list for this problem includes:    Lipofen 150 Mg Caps (Fenofibrate) ..... One tablet daily lipid prio to next OV  Problem # 2:  HYPERTENSION (ICD-401.9)  Her updated medication list for this problem includes:    Lotrel 5-10 Mg Caps (Amlodipine besy-benazepril hcl) ..... Once daily    Tenoretic 50 50-25 Mg Tabs (Atenolol-chlorthalidone) .Marland Kitchen... Take 1 tablet by mouth once a day  BP today: 136/88   Problem # 3:  OVERWEIGHT (ICD-278.02) needs an exercize program with diet  Complete Medication List: 1)   Lotrel 5-10 Mg Caps (Amlodipine besy-benazepril hcl) .... Once daily 2)  Tenoretic 50 50-25 Mg Tabs (Atenolol-chlorthalidone) .... Take 1 tablet by mouth once a day 3)  Lipofen 150 Mg Caps (Fenofibrate) .... One tablet daily   Patient Instructions: 1)  Take the Lipofen every day. 2)  It is important that you exercise regularly at least 20 minutes 5 times a week. If you develop chest pain, have severe difficulty breathing, or feel very tired , stop exercising immediately and seek medical attention. 3)  Two month return visit 4)  Lipid Panel prior to visit, ICD-9:272,4

## 2010-11-29 NOTE — Progress Notes (Signed)
  Phone Note Call from Patient   Caller: Patient Summary of Call: Patient calls in and has a fever of 102, feels ill. Feels like has another kidney infection. Reviewed the notes, looks like a more complex urological case, h/o recent pyelo. I advised to seek medical care tonight, should not wait until morning. Patient is going to go to ER for further evaluation. Initial call taken by: Hannah Beat MD,  April 13, 2009 8:12 PM

## 2010-11-29 NOTE — Procedures (Signed)
Summary: Colonoscopy   Colonoscopy  Procedure date:  08/26/2008  Findings:      Location:  [facility].    Procedures Next Due Date:    Colonoscopy: 08/2013  Patient Name: [name], [name] MRN:  Procedure Procedures: Colonoscopy CPT: 16109.    with biopsy. CPT: Q5068410.    with polypectomy. CPT: A3573898.  Personnel: Endoscopist: Iva Boop, MD, [facility].  Exam Location: Exam performed in Outpatient Clinic. Outpatient  Patient Consent: Procedure, Alternatives, Risks and Benefits discussed, consent obtained, from patient. Consent was obtained by the RN.  Indications Symptoms: Abdominal pain / bloating. Change in bowel habits. History  Current Medications: Patient is not currently taking Coumadin.  Allergies: Allergic to CRESTOR.  Pre-Exam Physical: Performed Aug 26, 2008. Cardio-pulmonary exam, Rectal exam, HEENT exam , Abdominal exam, Mental status exam WNL. Abnormal PE findings include: obese.  Comments: Pt. history reviewed/updated, physical exam performed prior to initiation of sedation? YES Exam Exam: Extent of exam reached: Cecum, extent intended: Cecum.  Patient position: on left side. Time to Cecum: 00:01:31. Time for Withdrawl: 00:08:12. Colon retroflexion performed. Images taken. ASA Classification: II. Tolerance: excellent.  Monitoring: Pulse and BP monitoring, Oximetry used. Supplemental O2 given.  Colon Prep Used MiraLax for colon prep. Prep results: excellent.  Sedation Meds: Patient assessed and found to be appropriate for moderate (conscious) sedation. Fentanyl 75 mcg. given IV. Versed 7 mg. given IV.  Findings - NORMAL EXAM: Ascending Colon to Descending Colon.  POLYP: Descending Colon, Maximum size: 5 mm. sessile polyp. removed, retrieved, Polyp sent to pathology. Path # 2.  POLYP: Cecum, Maximum size: 3 mm. sessile polyp. Procedure:  biopsy without cautery, removed, retrieved, sent to pathology.  - DIVERTICULOSIS: Descending  Colon to Sigmoid Colon.  PRIOR SURGERY: Sigmoid Colon. Segmental Colectomy. Anastamosis present.  STRICTURE / STENOSIS: Stricture in Sigmoid Colon.  Comments: some stricture effect at anastamosis.  NORMAL EXAM: Rectum.   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  Plans Comments: CONTINUE CURRENT REGIMEN OF FIBER AND PLUMS/PRUNE JUICE, IT IS WORKING WELL Disposition: After procedure patient sent to recovery. After recovery patient sent home.  Scheduling/Referral: Await pathology to schedule patient. Path Letter, to The Patient,     cc.   The Patient   [name]   REPORT OF SURGICAL PATHOLOGY   Case #: [id number] Patient Name: [name], [name] Office Chart Number:  [id number]   MRN: [id number] Pathologist: Havery Moros, MD DOB/Age  Nov 25, 1940 (Age: 69)    Gender: F Date Taken:  08/26/2008 Date Received: 08/26/2008   FINAL DIAGNOSIS   ***MICROSCOPIC EXAMINATION AND DIAGNOSIS***   1. CECUM, POLYP:  POLYPOID COLONIC MUCOSA WITH INFLAMMATION AND HYPERPLASTIC EPITHELIAL CHANGES.  NO GRANULOMATA OR ADENOMATOUS EPITHELIUM IDENTIFIED.   2. DESCENDING COLON, POLYP:  TUBULAR ADENOMA.  NO HIGH GRADE DYSPLASIA OR MALIGNANCY IDENTIFIED.   jy Date Reported:  08/27/2008     Havery Moros, MD *** Electronically Signed Out By BNS ***   Clinical information Polypectomy  R/O adenoma (jes)    specimen(s) obtained 1: Colon, biopsy, cecum 2: Colon, biopsy, descending   Gross Description 1.  Received in formalin is a tan, soft tissue fragment that is submitted in toto.  Size:  0.3 cm One block    2.  Received in formalin is a tan, soft tissue fragment  that is submitted in toto.  Size:  0.5 cm One block (ML:jes,08/27/08)    jes/      Signed by Iva Boop MD on 08/28/2008 at 7:58 AM  ________________________________________________________________________ colon recall 5 yrs 08/2013   Signed by Iva Boop MD on 08/28/2008 at 7:59 AM  ________________________________________________________________________   August 28, 2008 MRN: [id number]    [facility] [address] [place], [place]  [address]    Dear Ms. Lonsway,  The polyps removed from your colon were adenomatous. This means that they were pre-cancerous or that  they had the potential to change into cancer over time.  I recommend that you have a repeat colonoscopy in 5 years to determine if you have developed any new polyps over time. If you develop any new rectal bleeding, abdominal pain or significant bowel habit changes, please contact us before then.     Sincerely,  Iva Boop MD  This letter has been electronically signed by your physician.   Signed by Iva Boop MD on 08/28/2008 at 8:00 AM  ________________________________________________________________________ This report was created from the original endoscopy report, which was reviewed and signed by the above listed endoscopist.

## 2010-11-29 NOTE — Assessment & Plan Note (Signed)
Summary: 3 month rov/njr   Vital Signs:  Patient profile:   70 year old female Height:      70 inches Weight:      260 pounds BMI:     37.44 Temp:     97.4 degrees F oral Pulse rate:   80 / minute Resp:     14 per minute BP sitting:   124 / 76  (left arm) Cuff size:   large  Vitals Entered By: Willy Eddy, LPN ([birth year] 05[birth year] 1913 12:26 PM) Is Patient Diabetic? No  Does patient need assistance? Ambulation Normal   Primary Care Provider:  Richardo Priest   History of Present Illness: WEIGHT STABLE AND BLOOD PRESSURE SHOWS EXCELLENT RESPONSE PT HAS MULTIPLE QUESTIONS INCREASED GAS AND  BLOATING OCCASSION CHEST TWINGES BUT NOT SHORTNESS OR BREATH AND NO EXERTIONAL COMPONENT  Problems Prior to Update: 1)  Gerd  (ICD-530.81) 2)  Abdominal Pain, Left Upper and Lower  (ICD-789.09) 3)  Change in Bowels  (ICD-787.99) 4)  Fatigue  (ICD-780.79) 5)  Acute Sinusitis, Unspecified  (ICD-461.9) 6)  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) 7)  Diverticulosis, Colon  (ICD-562.10) 8)  Diverticulitis, Hx of  (ICD-V12.79) 9)  Gout, Unspecified  (ICD-274.9) 10)  Overweight  (ICD-278.02) 11)  Hyperlipidemia  (ICD-272.4) 12)  Hypertension  (ICD-401.9) 13)  Hyperlipidemia  (ICD-272.4)  Medications Prior to Update: 1)  Tenoretic 50 50-25 Mg  Tabs (Atenolol-Chlorthalidone) .... 1/2 Once Daily in The Morning 2)  Simvastatin 40 Mg Tabs (Simvastatin) .Marland Kitchen.. 1 Once Daily 3)  Adult Aspirin Ec Low Strength 81 Mg  Tbec (Aspirin) .... Once Daily 4)  Maxichlor Pse Dm 60-4-20 Mg  Tabs (Pseudoeph-Chlorphen-Dm) .... One By Mouth Every 6 Bhours As Needed 5)  Pepcid Ac 10 Mg Tabs (Famotidine) .... Take 1 Tablet By Mouth Once A Day 6)  Lotrel 5-20 Mg Caps (Amlodipine Besy-Benazepril Hcl) .... One By Mouth Daily  Current Medications (verified): 1)  Tenoretic 50 50-25 Mg  Tabs (Atenolol-Chlorthalidone) .... 1/2 Once Daily in The Morning 2)  Adult Aspirin Ec Low Strength 81 Mg  Tbec  (Aspirin) .... Once Daily 3)  Pepcid Ac 10 Mg Tabs (Famotidine) .... Take 1 Tablet By Mouth Once A Day 4)  Lotrel 5-20 Mg Caps (Amlodipine Besy-Benazepril Hcl) .... One By Mouth Daily  Allergies (verified): 1)  ! * Crestor  Past History  Past Medical History: Hyperlipidemia Hypertension Obesity Diverticulitis, hx of Diverticulosis, colon abdominal pain unspecified constipation (06/30/2008)  Past Surgical History: S/P skin graft for trauma Hysterectomy Bladder tack Colonoscopy-2001, 2005 Oophorectomy ureter repair  for transected left ureter temporary stent Colectomy for sigmoid diverticulosis  (01/16/2008)  Family History: Family History Other cancer-Breast, Prostate Family History of Breast Cancer:  mother No FH of Colon Cancer: Family History of Prostate Cancer: Father  (06/30/2008)  Social History: Never Smoked Alcohol use-no Drug use-no Married Patient does not get regular exercise.   (06/30/2008)  Risk Factors: Alcohol Use: N/A >5 drinks/d w/in last 3 months: N/A Caffeine Use: N/A Diet: N/A Exercise: no (06/30/2008)  Risk Factors: Smoking Status: never (06/03/2007) Packs/Day: N/A Cigars/wk: N/A Pipe Use/wk: N/A Cans of tobacco/wk: N/A Passive Smoke Exposure: N/A  Family History:    Reviewed history from 06/30/2008 and no changes required:       Family History Other cancer-Breast, Prostate       Family History of Breast Cancer:  mother       No FH of Colon Cancer:  Family History of Prostate Cancer: Father  Social History:    Reviewed history from 06/30/2008 and no changes required:       Never Smoked       Alcohol use-no       Drug use-no       Married       Patient does not get regular exercise.   Review of Systems       The patient complains of abdominal pain and severe indigestion/heartburn.  The patient denies anorexia, fever, weight loss, weight gain, vision loss, decreased hearing, hoarseness, chest pain, syncope, dyspnea on  exertion, peripheral edema, prolonged cough, headaches, hemoptysis, melena, hematochezia, hematuria, incontinence, genital sores, muscle weakness, suspicious skin lesions, transient blindness, difficulty walking, depression, unusual weight change, abnormal bleeding, enlarged lymph nodes, angioedema, and breast masses.    Physical Exam  General:  Well-developed,well-nourished,in no acute distress; alert,appropriate and cooperative throughout examination Head:  Normocephalic and atraumatic without obvious abnormalities. No apparent alopecia or balding. Eyes:  pupils equal and pupils round.   Ears:  R ear normal and L ear normal.   Nose:  no external deformity and no nasal discharge.   Mouth:  Oral mucosa and oropharynx without lesions or exudates.  Teeth in good repair. Neck:  No deformities, masses, or tenderness noted. Lungs:  clear anterior Heart:  Regular rate and rhythm; no murmurs, rubs,  or bruits. Abdomen:  obese, soft BS +. Some tenderness to deep palpation LUQ and LLQ. No masses. No rebound. Msk:  no joint tenderness, no joint swelling, and no joint warmth.   Neurologic:  a&o x 3gait normal.     Impression & Recommendations:  Problem # 1:  HYPERLIPIDEMIA (ICD-272.4)  The following medications were removed from the medication list:    Simvastatin 40 Mg Tabs (Simvastatin) .Marland Kitchen... 1 once daily Her updated medication list for this problem includes:    Trilipix 135 Mg Cpdr (Choline fenofibrate) ..... One by mouth    Trilipix 135 Mg Cpdr (Choline fenofibrate) ..... One by mouth at bed time  Labs Reviewed: Chol: 176 (09/04/2008)   HDL: 37.6 (09/04/2008)   LDL: 114 (09/04/2008)   TG: 123 (09/04/2008) SGOT: 28 (09/04/2008)   SGPT: 23 (09/04/2008)  Lipid Goals: Chol Goal: 200 (02/13/2008)   HDL Goal: 40 (02/13/2008)   LDL Goal: 130 (02/13/2008)   TG Goal: 150 (02/13/2008)  Prior 10 Yr Risk Heart Disease: 13 % (10/06/2008)  Problem # 2:  HYPERTENSION (ICD-401.9)  Her updated  medication list for this problem includes:    Tenoretic 50 50-25 Mg Tabs (Atenolol-chlorthalidone) .Marland Kitchen... 1/2 once daily in the morning    Lotrel 5-20 Mg Caps (Amlodipine besy-benazepril hcl) ..... One by mouth daily  Labs Reviewed: Chol: 176 (09/04/2008)   HDL: 37.6 (09/04/2008)   LDL: 114 (09/04/2008)   TG: 123 (09/04/2008) SGOT: 28 (09/04/2008)   SGPT: 23 (09/04/2008)  Lipid Goals: Chol Goal: 200 (02/13/2008)   HDL Goal: 40 (02/13/2008)   LDL Goal: 130 (02/13/2008)   TG Goal: 150 (02/13/2008)  Prior 10 Yr Risk Heart Disease: 13 % (10/06/2008)  BP today: 124/76 Prior BP: 132/80 (10/06/2008)  Prior 10 Yr Risk Heart Disease: 13 % (10/06/2008)  Labs Reviewed: Creat: 1.1 (09/04/2008) Chol: 176 (09/04/2008)   HDL: 37.6 (09/04/2008)   LDL: 114 (09/04/2008)   TG: 123 (09/04/2008)  Problem # 3:  OVERWEIGHT (ICD-278.02)  Problem # 4:  GOUT, UNSPECIFIED (ICD-274.9)  Elevate extremity; warm compresses, symptomatic relief and medication as directed.   Complete Medication List: 1)  Tenoretic 50 50-25 Mg Tabs (Atenolol-chlorthalidone) .... 1/2 once daily in the morning 2)  Adult Aspirin Ec Low Strength 81 Mg Tbec (Aspirin) .... Once daily 3)  Pepcid Ac 10 Mg Tabs (Famotidine) .... Take 1 tablet by mouth once a day 4)  Lotrel 5-20 Mg Caps (Amlodipine besy-benazepril hcl) .... One by mouth daily 5)  Trilipix 135 Mg Cpdr (Choline fenofibrate) .... One by mouth 6)  Trilipix 135 Mg Cpdr (Choline fenofibrate) .... One by mouth at bed time   Patient Instructions: 1)  Please schedule a follow-up appointment in 2 months. 2)  Hepatic Panel prior to visit, ICD-9: 995.20 3)  Lipid Panel prior to visit, ICD-9:272.4 4)  HbgA1C prior to visit, ICD-9:250.80 5)  Urine Microalbumin prior to visit, ICD-9: 6)  The medication list was reviewed and reconciled.  All changed / newly prescribed medications were explained.  A complete medication list was provided to the patient / caregiver.   Appended  Document: Orders Update    Clinical Lists Changes  Orders: Added new Service order of Est. Patient Level IV (47829) - Signed

## 2010-11-29 NOTE — Progress Notes (Signed)
Summary: A1C  Phone Note Call from Patient Call back at Work Phone [phone]   Caller: Nance Pear Summary of Call: Pt. will RTO for A1C blood draw, when her "husband has appointment". Joanne Chars CMA  June 17, 2009 2:50 PM  Call placed by: Joanne Chars, CMA Summary of Call: Pt. needs to RTO for A1C lab work, there wasn't enough blood to perform test. LMVM. Joanne Chars, CMA  Follow-up for Phone Call        dr Lovell Sheehan aware Follow-up by: Willy Eddy, LPN,  June 18, 2009 8:39 AM

## 2010-11-29 NOTE — Assessment & Plan Note (Signed)
Summary: 2 month ov labs prior/nta   Vital Signs:  Patient Profile:   70 Years Old Female Height:     70 inches Weight:      244 pounds Temp:     98.5 degrees F oral Pulse rate:   76 / minute Resp:     14 per minute BP sitting:   140 / 84  (left arm)  Vitals Entered By: Willy Eddy, LPN (December 09, 2007 11:53 AM)                 Chief Complaint:  roa/had colon resection in nov 2008/dr tsuei/r.  History of Present Illness: Current Problems:  LOC OSTEOARTHROS NOT SPEC PRIM/SEC LOWER LEG (ICD-715.36)  knee pain DIVERTICULOSIS, COLON (ICD-562.10)  with severe sigmoid diverticulosis with a fistula that had to close from thriid intent... now closed DIVERTICULITIS, HX OF (ICD-V12.79) GOUT, UNSPECIFIED (ICD-274.9) OVERWEIGHT (ICD-278.02)  weigh loss due to surgery HYPERLIPIDEMIA (ICD-272.4) HYPERTENSION (ICD-401.0      Hypertension History:      She denies headache, chest pain, palpitations, dyspnea with exertion, orthopnea, PND, peripheral edema, visual symptoms, neurologic problems, syncope, and side effects from treatment.        Positive major cardiovascular risk factors include female age 86 years old or older, hyperlipidemia, and hypertension.  Negative major cardiovascular risk factors include non-tobacco-user status.     Current Allergies (reviewed today): No known allergies  Updated/Current Medications (including changes made in today's visit):  LOTREL 5-10 MG  CAPS (AMLODIPINE BESY-BENAZEPRIL HCL) once daily TENORETIC 50 50-25 MG  TABS (ATENOLOL-CHLORTHALIDONE) 1 once daily CRESTOR 5 MG  TABS (ROSUVASTATIN CALCIUM) one by mouth daily   Past Medical History:    Reviewed history from 09/09/2007 and no changes required:       Hyperlipidemia       Hypertension       Obesity       Diverticulitis, hx of       Diverticulosis, colon  Past Surgical History:    Reviewed history from 06/12/2007 and no changes required:       S/P skin graft for trauma   Hysterectomy       Bladder tack       Colonoscopy-2001       Oophorectomy       ureter repair  for transected left ureter       temporary stent       Colectomy for sigmoid diverticulosis   Family History:    Reviewed history from 06/03/2007 and no changes required:       Family History Other cancer-Breast, Prostate  Social History:    Reviewed history from 06/03/2007 and no changes required:       Never Smoked       Alcohol use-no       Drug use-no       Married    Review of Systems  The patient denies anorexia, fever, weight loss, vision loss, decreased hearing, hoarseness, chest pain, syncope, dyspnea on exhertion, peripheral edema, prolonged cough, hemoptysis, abdominal pain, melena, hematochezia, severe indigestion/heartburn, hematuria, incontinence, genital sores, muscle weakness, suspicious skin lesions, transient blindness, difficulty walking, depression, unusual weight change, abnormal bleeding, enlarged lymph nodes, angioedema, breast masses, and testicular masses.       Impression & Recommendations:  Problem # 1:  HYPERLIPIDEMIA (ICD-272.4)  Labs Reviewed: Chol: 270 (12/04/2007)   HDL: 42.0 (12/04/2007)   LDL: DEL (12/04/2007)   TG: 141 (12/04/2007) SGOT: 25 (12/04/2007)   SGPT: 26 (  12/04/2007)  Prior 10 Yr Risk Heart Disease: Not enough information (10/02/2007)  Her updated medication list for this problem includes:    Crestor 5 Mg Tabs (Rosuvastatin calcium) ..... One by mouth daily  Orders: TLB-BMP (Basic Metabolic Panel-BMET) (80048-METABOL)   Problem # 2:  HYPERTENSION (ICD-401.9)  Her updated medication list for this problem includes:    Lotrel 5-10 Mg Caps (Amlodipine besy-benazepril hcl) ..... Once daily    Tenoretic 50 50-25 Mg Tabs (Atenolol-chlorthalidone) .Marland Kitchen... 1 once daily  BP today: 140/84 Prior BP: 116/80 (10/02/2007)  Prior 10 Yr Risk Heart Disease: Not enough information (10/02/2007)  Labs Reviewed: Creat: 1.1 (09/09/2007) Chol:  270 (12/04/2007)   HDL: 42.0 (12/04/2007)   LDL: DEL (12/04/2007)   TG: 141 (12/04/2007)  Orders: TLB-BMP (Basic Metabolic Panel-BMET) (80048-METABOL)   Problem # 3:  LOC OSTEOARTHROS NOT SPEC PRIM/SEC LOWER LEG (ICD-715.36) Discussed use of medications, application of heat or cold, and exercises.   Medications Added to Medication List This Visit: 1)  Tenoretic 50 50-25 Mg Tabs (Atenolol-chlorthalidone) .Marland Kitchen.. 1 once daily 2)  Crestor 5 Mg Tabs (Rosuvastatin calcium) .... One by mouth daily  Complete Medication List: 1)  Lotrel 5-10 Mg Caps (Amlodipine besy-benazepril hcl) .... Once daily 2)  Tenoretic 50 50-25 Mg Tabs (Atenolol-chlorthalidone) .Marland Kitchen.. 1 once daily 3)  Crestor 5 Mg Tabs (Rosuvastatin calcium) .... One by mouth daily  Hypertension Assessment/Plan:      The patient's hypertensive risk group is category B: At least one risk factor (excluding diabetes) with no target organ damage.  Today's blood pressure is 140/84.  Her blood pressure goal is < 140/90.   Patient Instructions: 1)  Please schedule a follow-up appointment in 3 months.    ]  Appended Document: 2 month ov labs prior/nta      Current Allergies: No known allergies         Complete Medication List: 1)  Lotrel 5-10 Mg Caps (Amlodipine besy-benazepril hcl) .... Once daily 2)  Tenoretic 50 50-25 Mg Tabs (Atenolol-chlorthalidone) .Marland Kitchen.. 1 once daily 3)  Crestor 5 Mg Tabs (Rosuvastatin calcium) .... One by mouth daily   Patient Instructions: 1)  Please schedule a follow-up appointment in 2 months. 2)  Hepatic Panel prior to visit, ICD-9:995.20 3)  Lipid Panel prior to visit, ICD-9:272.4    ]

## 2010-11-29 NOTE — Progress Notes (Signed)
Summary: cancelled appt today.  Phone Note Call from Patient   Summary of Call: Pt. cancelled appt today ..........stated she had ingested MSG which elevated her BP, and she went home and used natural diurectics and doubled BP meds for one day.  BP is under control now and will keep appt next week with Dr. Lovell Sheehan. Initial call taken by: Willy Eddy, LPN,  November 28, 2007 9:35 AM

## 2010-11-29 NOTE — Assessment & Plan Note (Signed)
Summary: FOLLOW UP/[facility] OK PER BONNYE   Vital Signs:  Patient profile:   70 year old female Height:      70 inches Weight:      260 pounds BMI:     37.44 Temp:     98.2 degrees F oral Pulse rate:   80 / minute Resp:     14 per minute BP sitting:   140 / 80  (left arm)  Vitals Entered By: Willy Eddy, LPN (June 15, 2009 4:11 PM)  Primary Care Provider:  Richardo Priest  CC:  [facility].  History of Present Illness: Had a hopspitalizatipon in june for pyelonephriis was dehydrated and required IV fluids  Hypertension History:      Positive major cardiovascular risk factors include female age 92 years old or older, hyperlipidemia, and hypertension.  Negative major cardiovascular risk factors include non-tobacco-user status.        Further assessment for target organ damage reveals no history of ASHD, stroke/TIA, or peripheral vascular disease.     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 1/2 Once Daily in The Morning 2)  Adult Aspirin Ec Low Strength 81 Mg  Tbec (Aspirin) .... Once Daily 3)  Pepcid Ac 10 Mg Tabs (Famotidine) .... Take 1 Tablet By Mouth Once A Day 4)  Lotrel 5-20 Mg Caps (Amlodipine Besy-Benazepril Hcl) .... One By Mouth Daily 5)  Trilipix  135 Mg Cpdr (Choline Fenofibrate) .... One By Mouth-Not Taking 6)  Ciprofloxacin Hcl 250 Mg Tabs (Ciprofloxacin Hcl) .... One By Mouth Bid  Current Medications (verified): 1)  Tenoretic 50 50-25 Mg  Tabs (Atenolol-Chlorthalidone) .... 1/2 Once Daily in The Morning 2)  Adult Aspirin Ec Low Strength 81 Mg  Tbec (Aspirin) .... Once Daily 3)  Pepcid Ac 10 Mg Tabs (Famotidine) .... Take 1 Tablet By Mouth Once A Day 4)  Lotrel 5-20 Mg Caps (Amlodipine Besy-Benazepril Hcl) .... One By Mouth Daily 5)  Trilipix 135 Mg Cpdr (Choline Fenofibrate) .... One By Mouth-Not Taking  Allergies (verified): 1)  ! * Crestor  Past History:  Family History: Last updated: 06/30/2008 Family History Other cancer-Breast, Prostate Family History of Breast Cancer:  mother No FH of Colon Cancer: Family History of Prostate Cancer: Father  Social History: Last updated: 06/30/2008 Never Smoked Alcohol use-no Drug use-no Married Patient does not get regular exercise.   Risk Factors: Exercise: no (06/30/2008)  Risk Factors: Smoking Status: never (06/03/2007)  Past medical, surgical, family and social histories (including risk factors) reviewed, and no changes noted (except as noted below).  Past Medical History: Reviewed history from 03/09/2009 and no changes required. Hyperlipidemia Hypertension Obesity Diverticulitis, hx of Diverticulosis, colon abdominal pain unspecified constipation  Past Surgical History: Reviewed history from 01/16/2008 and no changes required. S/P skin graft for trauma Hysterectomy  Bladder tack Colonoscopy-2001, 2005 Oophorectomy ureter repair  for transected left ureter temporary stent Colectomy for sigmoid diverticulosis  Family History: Reviewed history from 06/30/2008 and no changes required. Family History Other cancer-Breast, Prostate Family History of Breast Cancer:  mother No FH of Colon Cancer: Family History of Prostate Cancer: Father  Social  History: Reviewed history from 06/30/2008 and no changes required. Never Smoked Alcohol use-no Drug use-no Married Patient does not get regular exercise.   Review of Systems       The patient complains of abdominal pain.  The patient denies anorexia, fever, weight loss, weight gain, vision loss, decreased hearing, hoarseness, chest pain, syncope, dyspnea on exertion, peripheral edema, prolonged cough, headaches, hemoptysis, melena, hematochezia, severe indigestion/heartburn, hematuria, incontinence, genital sores, muscle weakness, suspicious skin lesions, transient blindness, difficulty walking, depression, unusual weight change, abnormal bleeding, enlarged lymph nodes, angioedema, and breast masses.    Physical Exam  General:  Well-developed,well-nourished,in no acute distress; alert,appropriate and cooperative throughout examination Head:  Normocephalic and atraumatic without obvious abnormalities. No apparent alopecia or balding. Eyes:  pupils equal and pupils round.   Nose:  no external deformity and no nasal discharge.   Mouth:  pharynx pink and moist and no erythema.   Neck:  No deformities, masses, or tenderness noted. Lungs:  clear anteriorno wheezes.   Heart:  Regular rate and rhythm; no murmurs, rubs,  or bruits. Abdomen:  soft, non-tender, and normal bowel sounds.   Msk:  No deformity or scoliosis noted of thoracic or lumbar spine.   Extremities:  trace left pedal edema and trace right pedal edema.   Neurologic:  a&o x 3gait normal.     Impression & Recommendations:  Problem # 1:  HYPERTENSION (ICD-401.9)  Her updated medication list for this problem includes:    Tenoretic 50 50-25 Mg Tabs (Atenolol-chlorthalidone) .Marland Kitchen... 1/2 once daily in the morning    Lotrel 5-20 Mg Caps (Amlodipine besy-benazepril hcl) ..... One by mouth daily  BP today: 140/80 Prior BP: 140/80 (03/09/2009)  Prior 10 Yr Risk Heart Disease: 17 % (03/09/2009)  Labs Reviewed: K+: 4.0  (09/04/2008) Creat: : 1.1 (09/04/2008)   Chol: 176 (09/04/2008)   HDL: 37.6 (09/04/2008)   LDL: 114 (09/04/2008)   TG: 123 (09/04/2008)  Problem # 2:  ABDOMINAL PAIN, LEFT UPPER AND LOWER (ICD-789.09) check liver functions to r/p pancreatitis Her updated medication list for this problem includes:    Adult Aspirin Ec Low Strength 81 Mg Tbec (Aspirin) ..... Once daily  Orders: TLB-Hepatic/Liver Function Pnl (80076-HEPATIC)  Problem # 3:  DIVERTICULOSIS, COLON (ICD-562.10) watching diet  Complete Medication List: 1)  Tenoretic 50 50-25 Mg Tabs (Atenolol-chlorthalidone) .... 1/2 once daily in the morning 2)  Adult Aspirin Ec Low Strength 81 Mg Tbec (Aspirin) .... Once daily 3)  Pepcid Ac 10 Mg Tabs (Famotidine) .... Take 1 tablet by mouth once a day 4)  Lotrel 5-20 Mg Caps (Amlodipine besy-benazepril hcl) .... One by mouth daily 5)  Trilipix 135 Mg Cpdr (Choline fenofibrate) .... One by mouth-not taking  Other Orders: Venipuncture (60454) TLB-TSH (Thyroid Stimulating Hormone) (84443-TSH) TLB-A1C / Hgb A1C (Glycohemoglobin) (83036-A1C) TLB-Cholesterol, Total (82465-CHO) TLB-Cholesterol, Direct LDL (83721-DIRLDL) TLB-Cholesterol, HDL (83718-HDL)  Hypertension Assessment/Plan:      The patient's hypertensive risk group is category B: At least one risk factor (excluding diabetes) with no target organ damage.  Her calculated 10 year risk of coronary heart disease is 17 %.  Today's blood pressure is 140/80.  Her blood pressure goal is < 140/90.  Patient Instructions: 1)  Please schedule a follow-up appointment in 3 months.  Appended Document: FOLLOW UP/[facility] OK PER BONNYE

## 2010-11-29 NOTE — Assessment & Plan Note (Signed)
Summary: Low HR, worried about 63, trouble hearing her heartbeat, leth...   Vital Signs:  Patient Profile:   70 Years Old Female Height:     70 inches Weight:      249 pounds Temp:     98.4 degrees F oral Pulse rate:   72 / minute BP sitting:   120 / 80  (right arm) Cuff size:   large  Vitals Entered By: Romualdo Bolk, CMA (April 03, 2008 3:55 PM)                 Chief Complaint:  Pt is here for fatigue and low heart rate.Marland Kitchen  History of Present Illness: 70 year old female seen today for follow-up.  She presents with the chief complaint of fatigue and weakness.  She noticed her pulse rate becoming slow in the low 60s.  She was seen a couple was going laboratory studies were obtained.  That were unremarkable.  She has also been treated for bronchitis recently and her symptoms are largely resolved.  Her antihypertensive regimen includes beta-blocker therapy    Prior Medications Reviewed Using: Patient Recall  Updated Prior Medication List: LOTREL 5-10 MG  CAPS (AMLODIPINE BESY-BENAZEPRIL HCL) once daily TENORETIC 50 50-25 MG  TABS (ATENOLOL-CHLORTHALIDONE) 1 once daily CRESTOR 5 MG  TABS (ROSUVASTATIN CALCIUM) one by mouth daily ADULT ASPIRIN EC LOW STRENGTH 81 MG  TBEC (ASPIRIN) once daily MAXICHLOR PSE DM 60-4-20 MG  TABS (PSEUDOEPH-CHLORPHEN-DM) one by mouth every 6 bhours as needed VITAMIN C 500 MG  TABS (ASCORBIC ACID)   Current Allergies (reviewed today): No known allergies   Past Medical History:    Reviewed history from 09/09/2007 and no changes required:       Hyperlipidemia       Hypertension       Obesity       Diverticulitis, hx of       Diverticulosis, colon      Physical Exam  General:     overweight-appearing.  118/72 Head:     Normocephalic and atraumatic without obvious abnormalities. No apparent alopecia or balding. Eyes:     No corneal or conjunctival inflammation noted. EOMI. Perrla. Funduscopic exam benign, without hemorrhages, exudates  or papilledema. Vision grossly normal. Mouth:     Oral mucosa and oropharynx without lesions or exudates.  Teeth in good repair. Neck:     No deformities, masses, or tenderness noted. Chest Wall:     No deformities, masses, or tenderness noted. Lungs:     Normal respiratory effort, chest expands symmetrically. Lungs are clear to auscultation, no crackles or wheezes. Heart:     Normal rate and regular rhythm. S1 and S2 normal without gallop, murmur, click, rub or other extra sounds. rate 62 to 64    Impression & Recommendations:  Problem # 1:  HYPERTENSION (ICD-401.9)  Her updated medication list for this problem includes:    Lotrel 5-10 Mg Caps (Amlodipine besy-benazepril hcl) ..... Once daily    Tenoretic 50 50-25 Mg Tabs (Atenolol-chlorthalidone) .Marland Kitchen... 1/2 once daily   Problem # 2:  FATIGUE (ICD-780.79) will decrease her Tenoretic to one half tablet daily  Complete Medication List: 1)  Lotrel 5-10 Mg Caps (Amlodipine besy-benazepril hcl) .... Once daily 2)  Tenoretic 50 50-25 Mg Tabs (Atenolol-chlorthalidone) .... 1/2 once daily 3)  Crestor 5 Mg Tabs (Rosuvastatin calcium) .... One by mouth daily 4)  Adult Aspirin Ec Low Strength 81 Mg Tbec (Aspirin) .... Once daily 5)  Maxichlor Pse Dm 60-4-20 Mg  Tabs (Pseudoeph-chlorphen-dm) .... One by mouth every 6 bhours as needed 6)  Vitamin C 500 Mg Tabs (Ascorbic acid)   Patient Instructions: 1)  Limit your Sodium (Salt) to less than 2 grams a day(slightly less than 1/2 a teaspoon) to prevent fluid retention, swelling, or worsening of symptoms. 2)  It is important that you exercise regularly at least 20 minutes 5 times a week. If you develop chest pain, have severe difficulty breathing, or feel very tired , stop exercising immediately and seek medical attention.   ]

## 2010-11-29 NOTE — Consult Note (Signed)
Summary: [facility]   Imported By: Kassie Mends 03/12/2008 10:42:34  _____________________________________________________________________  External Attachment:    Type:   Image     Comment:   [facility]

## 2010-11-29 NOTE — Assessment & Plan Note (Signed)
Summary: gas/abdominal pain/[name]   History of Present Illness Visit Type: consult  Primary GI MD: Stan Head MD [facility] Primary Provider: [name], MD  Requesting Provider: [name], MD  Chief Complaint: LLQ abd pain, and gas  History of Present Illness:   LLQ pain, lat and pulling pain under left breast intermittent problem, she cannot identify a precipitant same probem since colon resection see phone note - 12/14  KUB neg on liqs over weekend and is better, no pain now doeas have some back pain at times  if she eats something and "get off of my systen", late and does not take time to sit ad digest  gets flatus and left > right swelling also gets pressure in her rectum having some dificulty defecating lately she will have better defecation if she eats cauliflower prune juice blows me out using citrate of magnesia at times with relief also   GI Review of Systems    Reports abdominal pain.     Location of  Abdominal pain: LLQ.    Denies acid reflux, belching, bloating, chest pain, dysphagia with liquids, dysphagia with solids, heartburn, loss of appetite, nausea, vomiting, vomiting blood, weight loss, and  weight gain.        Denies anal fissure, black tarry stools, change in bowel habit, constipation, diarrhea, diverticulosis, fecal incontinence, heme positive stool, hemorrhoids, irritable bowel syndrome, jaundice, light color stool, liver problems, rectal bleeding, and  rectal pain. Preventive Screening-Counseling & Management  Caffeine-Diet-Exercise     Diet Comments: eating too much     Diet Counseling: to improve diet; diet is suboptimal     Nutrition Referrals: yes  Comments: she wants to see a dietitian re: weight loss gained on top of obesity caring for mother-in-law     Current Medications (verified): 1)  Tenoretic 50 50-25 Mg  Tabs (Atenolol-Chlorthalidone) .... 1/2 Once Daily in The Morning 2)  Adult Aspirin Ec Low Strength 81 Mg  Tbec (Aspirin) ....  Once Daily 3)  Lotrel 5-20 Mg Caps (Amlodipine Besy-Benazepril Hcl) .... One By Mouth Daily 4)  Prevacid 15 Mg Cpdr (Lansoprazole) .... One By Mouth Once Daily As Needed 5)  Livalo 4 Mg Tabs (Pitavastatin Calcium) .Marland Kitchen.. 1 On Mondays 6)  Vitamin D 1000 Unit  Tabs (Cholecalciferol) .... One Tablet By Mouth Once Daily  Allergies (verified): 1)  ! * Crestor  Past History:  Past Medical History: Hyperlipidemia Hypertension Obesity Diverticulitis, hx of Diverticulosis, colon abdominal pain unspecified constipation colon adenoma 2009  Past Surgical History: S/P skin graft for trauma Hysterectomy Bladder tack Colonoscopy-2001, 2005, 2009 Oophorectomy ureter repair  for transected left ureter temporary stent Colectomy for sigmoid diverticulosis  Family History: Reviewed history from 06/30/2008 and no changes required. Family History of Breast Cancer:  Mother  Family History of Prostate Cancer: Father No FH of Colon Cancer:  Social History: Reviewed history from 06/30/2008 and no changes required. Never Smoked Alcohol use-no Drug use-no Married Patient does not get regular exercise.  Diet Comments:  eating too much  Vital Signs:  Patient profile:   70 year old female Height:      70 inches Weight:      264 pounds BMI:     38.02 BSA:     2.35 Pulse rate:   76 / minute Pulse rhythm:   regular BP sitting:   126 / 72  (left arm) Cuff size:   large  Vitals Entered By: Ok Anis CMA (November 15, 2009 8:55 AM)  Physical Exam  General:  alert and overweight-appearing.   Lungs:  Clear throughout to auscultation. Heart:  Regular rate and rhythm; no murmurs, rubs,  or bruits. Abdomen:  obese, soft, "sore" BS+ no mass Neurologic:  Alert and  oriented x4;   Impression & Recommendations:  Problem # 1:  ABDOMINAL PAIN, LEFT UPPER AND LOWER (ICD-789.09) ? due to perceived stricture and some obstruction (2009 colonoscopy suggestive of some stricturing or stenosis at  colonic anastamosis her problems are consistent with same issues she had in 2009, was better on prune juice then she may need additional imaging like BE but will try MiraLax first if that is not helpful will like do BE to see if there really is stricture  Problem # 2:  DIVERTICULOSIS, COLON (ICD-562.10) Assessment: Unchanged do not think diverticulitis is ongoing here though with acute LLQ pain that she had 1/14 was reasonable to think of that  Problem # 3:  OBESITY (ICD-278.00) Assessment: New Not a new medical problem but new issue for today Orders: [facility])  Patient Instructions: 1)  Please call Dr Leone Payor in 4-6 weeks with an update on how your doing 2)  Use Miralax twice dailly 3)  We will do a referral to [facility] for obesity 4)  We will contact you with the appointment times as we recieve them. 5)  If you would like to contact the nutrition center the number is [phone] 6)  The medication list was reviewed and reconciled.  All changed / newly prescribed medications were explained.  A complete medication list was provided to the patient / caregiver.

## 2010-11-29 NOTE — Progress Notes (Signed)
Summary: Lotrel  Phone Note Call from Patient   Caller: Patient Call For: Dr. Lovell Sheehan Summary of Call: Pt would like a generic 4.00 med for Lotrel called to [facility]). [phone] Initial call taken by: Lynann Beaver CMA,  July 20, 2008 3:51 PM  Follow-up for Phone Call        may have a split drug therapy ( two for one) benazipril 10 and amlodipine 5 one of each daily Follow-up by: [name] MD,  July 20, 2008 4:45 PM      Appended Document: Lotrel triage it does no look like this rx was done nor was it docummneed in pts meds please review, change meds if pt desires  Appended Document: Lotrel Pt. notified.

## 2010-11-29 NOTE — Progress Notes (Signed)
Summary: SAMPLES AND RF  Phone Note Call from Patient Call back at Home Phone [phone]   Caller: Patient-LIVE CALL Call For: DR Lennon Alstrom Reason for Call: Refill Medication Summary of Call: RF ATENOLOL CHLOR 50/25MG  1QD RF GENERIC LOTREL 5/10MG  1QD. PLEASE CALL [facility] AT [phone].  THESE MAY BE NEW RXS. NEED SAMPLES OF BOTH IF AVAILABLE. Initial call taken by: Warnell Forester,  January 20, 2008 2:20 PM      Prescriptions: TENORETIC 50 50-25 MG  TABS (ATENOLOL-CHLORTHALIDONE) 1 once daily  #30 x 6   Entered by:   Willy Eddy, LPN   Authorized by:   [name] MD   Signed by:   Willy Eddy, LPN on 21/30/8657   Method used:   Electronically sent to ...       [facility]       [address]       [place], [place]  [address]       Ph: [phone]       Fax: [phone]   RxID:   [id number] LOTREL 5-10 MG  CAPS (AMLODIPINE BESY-BENAZEPRIL HCL) once daily  #30 x 6   Entered by:   Willy Eddy, LPN   Authorized by:   [name] MD   Signed by:   Willy Eddy, LPN on 56/43/3295   Method used:   Electronically sent to ...       [facility]       [address]       [place], [place]  [address]       Ph: [phone]       Fax: [phone]   RxID:   [id number]     Appended Document: SAMPLES AND RF [pt informed no samples but script called in

## 2010-11-29 NOTE — Assessment & Plan Note (Signed)
Summary: 2 MONTH ROV/NJR   Vital Signs:  Patient profile:   70 year old female Height:      70 inches Weight:      272 pounds BMI:     39.17 Temp:     98.3 degrees F oral Pulse rate:   76 / minute Resp:     14 per minute BP sitting:   132 / 80  (left arm) Cuff size:   large  Vitals Entered By: Willy Eddy, LPN (February 15, 2010 1:36 PM) CC: roa labs, Hypertension Management   Primary Care Provider:  [name], MD   CC:  [facility] labs and Hypertension Management.  History of Present Illness: she did not  resume the livalo after surgery she had the breast surgery   Hypertension History:      She denies headache, chest pain, palpitations, dyspnea with exertion, orthopnea, PND, peripheral edema, visual symptoms, neurologic problems, syncope, and side effects from treatment.        Positive major cardiovascular risk factors include female age 69 years old or older, hyperlipidemia, and hypertension.  Negative major cardiovascular risk factors include non-tobacco-user status.        Further assessment for target organ damage reveals no history of ASHD, stroke/TIA, or peripheral vascular disease.     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) 9)  Diverticulosis, Colon  (ICD-562.10) 10)  Diverticulitis, Hx of  (ICD-V12.79) 11)  Gout, Unspecified  (ICD-274.9) 12)  Hypertension  (ICD-401.9) 13)  Hyperlipidemia  (ICD-272.4)  Current Problems (verified): 1)  Lump or Mass in Breast  (ICD-611.72) 2)  Obesity  (ICD-278.00) 3)  Back Pain, Acute  (ICD-724.5) 4)   Dysuria, Chronic  (ICD-788.1) 5)  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 1/2 Once Daily in The Morning 2)  Adult Aspirin Ec Low Strength 81 Mg  Tbec (Aspirin) .... Once Daily 3)  Lotrel 5-20 Mg Caps (Amlodipine Besy-Benazepril Hcl) .... One By Mouth Daily 4)  Prevacid 15 Mg Cpdr (Lansoprazole) .... One By Mouth Once Daily As Needed 5)  Livalo 4 Mg Tabs (Pitavastatin Calcium) .Marland Kitchen.. 1 One Monday , Wednesday and Friday 6)  Vitamin D 1000 Unit  Tabs (Cholecalciferol) .... One Tablet By Mouth Once Daily 7)  Cipro 250 Mg Tabs (Ciprofloxacin Hcl) .... One By Mouth Two Times A Day X 5 Days  Current Medications (verified): 1)  Tenoretic 50 50-25 Mg  Tabs (Atenolol-Chlorthalidone) .... 1/2 Once Daily in The Morning 2)  Adult Aspirin Ec Low Strength 81 Mg  Tbec (Aspirin) .... Once Daily 3)  Lotrel 5-20 Mg Caps (Amlodipine Besy-Benazepril Hcl) .... One By Mouth Daily 4)  Prevacid 15 Mg Cpdr (Lansoprazole) .... One By Mouth Once Daily As Needed 5)  Livalo 4 Mg Tabs (Pitavastatin Calcium) .Marland Kitchen.. 1 One Monday , Wednesday and Friday 6)  Vitamin D 1000 Unit  Tabs (Cholecalciferol) .... One Tablet By Mouth Once Daily 7)  Mupirocin 2 % Oint (Mupirocin) .... Apply To Site Two Times A Day  Allergies (verified): 1)  ! * Crestor  Past History:  Family History: Last updated: 11/15/2009 Family History of Breast Cancer:  Mother  Family History of Prostate Cancer: Father No FH of Colon Cancer:  Social History: Last updated: 06/30/2008 Never Smoked Alcohol use-no Drug  use-no Married Patient does not get regular exercise.   Risk Factors: Diet: eating too much (11/15/2009) Exercise: no (06/30/2008)  Risk Factors: Smoking Status: never (02/15/2010)  Past medical, surgical, family and social histories (including risk factors) reviewed, and no changes noted (except as noted below).  Past Medical History: Reviewed history from 11/15/2009 and no changes required. Hyperlipidemia Hypertension Obesity Diverticulitis, hx of Diverticulosis, colon abdominal pain unspecified constipation colon adenoma 2009  Past Surgical History: Reviewed history from 11/15/2009 and no changes required. 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 Family History of Breast Cancer:  Mother  Family History of Prostate Cancer: Father No FH of Colon Cancer:  Social History: Reviewed history from 06/30/2008 and no changes required. Never Smoked Alcohol use-no Drug use-no Married Patient does not get regular exercise.   Review of Systems  The patient denies anorexia, fever, weight loss, weight gain, vision loss, decreased hearing, hoarseness, chest pain, syncope, dyspnea on exertion, peripheral edema, prolonged cough, headaches, hemoptysis, abdominal pain, melena, hematochezia, severe indigestion/heartburn, hematuria, incontinence, genital sores, muscle weakness, suspicious skin lesions, transient blindness, difficulty walking, depression, unusual weight change, abnormal bleeding, enlarged lymph nodes, angioedema, and breast masses.    Physical Exam  General:  alert and overweight-appearing.   Head:  normocephalic and no abnormalities palpated.   Eyes:  pupils equal and pupils reactive to light.   Ears:  R ear normal and L ear normal.   Nose:   no external deformity and no nasal discharge.   Mouth:  pharynx pink and moist and no erythema.   Neck:  No deformities, masses, or tenderness noted. Heart:  normal rate and regular rhythm.   Abdomen:  soft, no guarding, and epigastric tenderness.   Msk:  joint tenderness and joint swelling.   Extremities:  trace left pedal edema and trace right pedal edema.   Neurologic:  alert & oriented X3, gait normal, and DTRs symmetrical and normal.     Impression & Recommendations:  Problem # 1:  HYPERLIPIDEMIA (ICD-272.4) she was off the livalo for several months Her updated medication list for this problem includes:    Livalo 4 Mg Tabs (Pitavastatin calcium) .Marland Kitchen... 1 one monday , wednesday and friday  Labs Reviewed: SGOT: 27 (02/08/2010)   SGPT: 22 (02/08/2010)  Lipid Goals: Chol Goal: 200 (02/13/2008)   HDL Goal: 40 (02/13/2008)   LDL Goal: 130 (02/13/2008)   TG Goal: 150 (02/13/2008)  Prior 10 Yr Risk Heart Disease: 11 % (12/16/2009)   HDL:45.00 (02/08/2010), 45.10 (12/09/2009)  LDL:114 (09/04/2008), DEL (05/29/2008)  Chol:223 (02/08/2010), 244 (12/09/2009)  Trig:128.0 (02/08/2010), 158.0 (12/09/2009)  Problem # 2:  DYSURIA, CHRONIC (ICD-788.1) monitering The following medications were removed from the medication list:  Cipro 250 Mg Tabs (Ciprofloxacin hcl) ..... One by mouth two times a day x 5 days  Encouraged to push clear liquids, get enough rest, and take acetaminophen as needed. To be seen in 10 days if no improvement, sooner if worse.  Problem # 3:  HYPERTENSION (ICD-401.9)  Her updated medication list for this problem includes:    Tenoretic 50 50-25 Mg Tabs (Atenolol-chlorthalidone) .Marland Kitchen... 1/2 once daily in the morning    Lotrel 5-20 Mg Caps (Amlodipine besy-benazepril hcl) ..... One by mouth daily  BP today: 132/80 Prior BP: 140/80 (12/30/2009)  Prior 10 Yr Risk Heart Disease: 11 % (12/16/2009)  Labs Reviewed: K+: 4.0 (09/04/2008) Creat: : 1.1 (09/04/2008)   Chol: 223  (02/08/2010)   HDL: 45.00 (02/08/2010)   LDL: 114 (09/04/2008)   TG: 128.0 (02/08/2010)  Problem # 4:  CELLULITIS AND ABSCESS OF OTHER SPECIFIED SITE [phone])  The following medications were removed from the medication list:    Cipro 250 Mg Tabs (Ciprofloxacin hcl) ..... One by mouth two times a day x 5 days  Elevate affected area. Warm moist compresses for 20 minutes every 2 hours while awake. Take antibiotics as directed and take acetaminophen as needed. To be seen in 48-72 hours if no improvement, sooner if worse.  Complete Medication List: 1)  Tenoretic 50 50-25 Mg Tabs (Atenolol-chlorthalidone) .... 1/2 once daily in the morning 2)  Adult Aspirin Ec Low Strength 81 Mg Tbec (Aspirin) .... Once daily 3)  Lotrel 5-20 Mg Caps (Amlodipine besy-benazepril hcl) .... One by mouth daily 4)  Prevacid 15 Mg Cpdr (Lansoprazole) .... One by mouth once daily as needed 5)  Livalo 4 Mg Tabs (Pitavastatin calcium) .Marland Kitchen.. 1 one monday , wednesday and friday 6)  Vitamin D 1000 Unit Tabs (Cholecalciferol) .... One tablet by mouth once daily 7)  Mupirocin 2 % Oint (Mupirocin) .... Apply to site two times a day  Hypertension Assessment/Plan:      The patient's hypertensive risk group is category B: At least one risk factor (excluding diabetes) with no target organ damage.  Her calculated 10 year risk of coronary heart disease is 11 %.  Today's blood pressure is 132/80.  Her blood pressure goal is < 140/90.  Patient Instructions: 1)  Please schedule a follow-up appointment in 3 months. Prescriptions: MUPIROCIN 2 % OINT (MUPIROCIN) apply to site two times a day  #15gm x 1   Entered and Authorized by:   [name] MD   Signed by:   [name] MD on 02/15/2010   Method used:   Electronically to        [facility] (retail)       [address]       [place], [place]  [address]       Ph: [phone]       Fax: [phone]   RxID:   [id number]

## 2010-11-29 NOTE — Consult Note (Signed)
Summary: [facility]  [facility]   Imported By: Kassie Mends 01/30/2008 12:46:22  _____________________________________________________________________  External Attachment:    Type:   Image     Comment:   [facility]

## 2010-11-29 NOTE — Assessment & Plan Note (Signed)
Summary: leg pain/s/p visit to er Monday night.dm---PT TO LAB FOR BLDW...   Vital Signs:  Patient profile:   70 year old female Pulse rate:   72 / minute Pulse rhythm:   regular BP sitting:   120 / 84  (left arm) Cuff size:   large  Vitals Entered By: Pura Spice, RN (May 05, 2010 12:04 PM) CC: went to [facility] ER kneep pain had Ct . Has appt with Dr [name] next week July 13 .    Contraindications/Deferment of Procedures/Staging:    Test/Procedure: Weight Refused    Reason for deferment: patient declined-cannot calculate BMI   History of Present Illness: This 70 year old former female it in today complaining of pain in her right knee and [facility] states he did not get any treatment out of the [facility] for she received Percocet and a Medrol Dosepak. A shunt is taken Aleve and is doing some better as past history her gallop colon to the patient and when she has an acute wire we'll discuss treatment. However patient is due to see Dr. [name] on July 13 for her 3 month return office visit to evaluate all of her medical problems  Allergies: 1)  ! * Crestor  Past History:  Past Medical History: Last updated: 11/15/2009 Hyperlipidemia Hypertension Obesity Diverticulitis, hx of Diverticulosis, colon abdominal pain unspecified constipation colon adenoma 2009  Past Surgical History: Last updated: 11/15/2009 S/P skin graft for trauma Hysterectomy Bladder tack Colonoscopy-2001, 2005, 2009 Oophorectomy ureter repair  for transected left ureter temporary stent Colectomy for sigmoid diverticulosis  Family History: Last updated: 11/15/2009 Family History of Breast Cancer:  Mother  Family History of Prostate Cancer: Father No FH of Colon Cancer:  Social History: Last updated: 06/30/2008 Never Smoked Alcohol use-no Drug use-no Married Patient does not get regular exercise.   Risk Factors: Smoking Status: never (02/15/2010)  Review of  Systems      See HPI  The patient denies anorexia, fever, weight loss, weight gain, vision loss, decreased hearing, hoarseness, chest pain, syncope, dyspnea on exertion, peripheral edema, prolonged cough, headaches, hemoptysis, abdominal pain, melena, hematochezia, severe indigestion/heartburn, hematuria, incontinence, genital sores, muscle weakness, suspicious skin lesions, transient blindness, difficulty walking, depression, unusual weight change, abnormal bleeding, enlarged lymph nodes, angioedema, breast masses, and testicular masses.    Physical Exam  General:  alert, well-developed, well-nourished, and overweight-appearing.  writing in a multiple vehicle Lungs:  Normal respiratory effort, chest expands symmetrically. Lungs are clear to auscultation, no crackles or wheezes. Heart:  Normal rate and regular rhythm. S1 and S2 normal without gallop, murmur, click, rub or other extra sounds. blood pressure controlleed Msk:  swollen tender right knee   Impression & Recommendations:  Problem # 1:  GERD (ICD-530.81) Assessment Improved  Her updated medication list for this problem includes:    Prevacid 15 Mg Cpdr (Lansoprazole) ..... One by mouth once daily as needed  Problem # 2:  OBESITY (ICD-278.00) Assessment: Unchanged  Problem # 3:  DIVERTICULITIS, HX OF (ICD-V12.79) Assessment: Improved  Problem # 4:  HYPERTENSION (ICD-401.9) Assessment: Improved  Her updated medication list for this problem includes:    Tenoretic 50 50-25 Mg Tabs (Atenolol-chlorthalidone) .Marland Kitchen... 1/2 once daily in the morning    Lotrel 5-20 Mg Caps (Amlodipine besy-benazepril hcl) ..... One by mouth daily  Complete Medication List: 1)  Tenoretic 50 50-25 Mg Tabs (Atenolol-chlorthalidone) .... 1/2 once daily in the morning 2)  Adult Aspirin Ec Low Strength 81  Mg Tbec (Aspirin) .... Once daily 3)  Lotrel 5-20 Mg Caps (Amlodipine besy-benazepril hcl) .... One by mouth daily 4)  Prevacid 15 Mg Cpdr  (Lansoprazole) .... One by mouth once daily as needed 5)  Crestor 20 Mg Tabs (Rosuvastatin calcium) .... One by mouth mwf 6)  Vitamin D 1000 Unit Tabs (Cholecalciferol) .... One tablet by mouth once daily 7)  Aleve 220 Mg Tabs (Naproxen sodium) .... Two by mouth two times a day  Other Orders: Venipuncture (04540) TLB-Lipid Panel (80061-LIPID) TLB-Hepatic/Liver Function Pnl (80076-HEPATIC)  Patient Instructions: 1)  I think you had episode of gout and still has symptoms 2)  start Indomethacin three times a day 3)  after meals for 2-3 days then decrease dosage, if reoccurs restart medication 4)  2 good Knee surgeons regarding your left knee  5)  Dr. Jackolyn Confer and Dr. Lajoyce Corners, both in [facility]

## 2010-11-29 NOTE — Letter (Signed)
Summary: Dr Corliss Skains note  Dr Corliss Skains note   Imported By: Kassie Mends 11/15/2007 13:39:01  _____________________________________________________________________  External Attachment:    Type:   Image     Comment:   Dr Corliss Skains note

## 2010-11-29 NOTE — Assessment & Plan Note (Signed)
Summary: CONSULTATION // RS   Vital Signs:  Patient profile:   70 year old female Height:      70 inches Weight:      274 pounds BMI:     39.46 Temp:     98.2 degrees F oral Pulse rate:   76 / minute Resp:     14 per minute BP sitting:   140 / 80  (left arm) Cuff size:   large  Vitals Entered By: Willy Eddy, LPN (December 30, 4096 12:42 PM) CC: to discuss upcoming breast surgery   Primary Care Provider:  [name], MD   CC:  to discuss upcoming breast surgery.  History of Present Illness: wants to discuss the results of the mamograms and bx and placed a marker/schip the results of the bx calcifications cluster but no malignancy but wants to do a lump ectomy Has significnat bruising at the site Urine is dark and sticky? 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) 8)  Diverticulosis, Colon  (ICD-562.10) 9)  Diverticulitis, Hx of  (ICD-V12.79) 10)  Gout, Unspecified  (ICD-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.9) 11)  Hypertension  (ICD-401.9) 12)  Hyperlipidemia  (ICD-272.4)  Current Problems (verified): 1)  Obesity  (ICD-278.00) 2)  Back Pain, Acute  (ICD-724.5) 3)  Dysuria, Chronic  (ICD-788.1) 4)  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 1/2 Once Daily in The Morning 2)  Adult Aspirin Ec Low Strength 81 Mg  Tbec (Aspirin) .... Once Daily 3)  Lotrel 5-20 Mg Caps (Amlodipine Besy-Benazepril Hcl) .... One By Mouth Daily 4)  Prevacid 15 Mg Cpdr (Lansoprazole) .... One By Mouth Once Daily As Needed 5)  Livalo 4 Mg Tabs (Pitavastatin Calcium) .Marland Kitchen.. 1 One Monday , Wednesday and Friday 6)  Vitamin D 1000 Unit  Tabs (Cholecalciferol) .... One Tablet By Mouth Once Daily  Current Medications (verified): 1)  Tenoretic 50 50-25 Mg  Tabs (Atenolol-Chlorthalidone) .... 1/2 Once Daily in The Morning 2)  Adult Aspirin Ec Low Strength 81 Mg  Tbec (Aspirin) .... Once Daily 3)  Lotrel 5-20 Mg Caps (Amlodipine Besy-Benazepril Hcl) .... One By Mouth Daily 4)  Prevacid 15 Mg Cpdr (Lansoprazole) .... One By Mouth Once Daily As Needed 5)  Livalo 4 Mg Tabs (Pitavastatin Calcium) .Marland Kitchen.. 1 One Monday , Wednesday and Friday 6)  Vitamin D 1000 Unit  Tabs (Cholecalciferol) .... One Tablet By Mouth Once Daily 7)  Cipro 250 Mg Tabs (Ciprofloxacin Hcl) .... One By Mouth Two Times A Day X 5 Days  Allergies (verified): 1)  ! * Crestor  Past History:  Family History: Last updated: 11/15/2009 Family History of Breast Cancer:  Mother  Family History of Prostate Cancer: Father No FH of Colon Cancer:  Social History: Last updated: 06/30/2008 Never Smoked Alcohol use-no Drug use-no Married Patient does not get regular exercise.   Risk Factors: Diet: eating too much (11/15/2009) Exercise: no (06/30/2008)  Risk Factors: Smoking Status: never (12/30/2009)  Past medical, surgical, family and social histories (including risk factors) reviewed, and no changes noted (except as noted below).  Past Medical History: Reviewed history from 11/15/2009 and no changes  required. Hyperlipidemia Hypertension Obesity Diverticulitis, hx of Diverticulosis, colon abdominal pain unspecified constipation colon adenoma 2009  Past Surgical History: Reviewed history from 11/15/2009 and no changes required. 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 Family History of Breast Cancer:  Mother  Family History of Prostate Cancer: Father No FH of Colon Cancer:  Social History: Reviewed history from 06/30/2008 and no changes required. Never Smoked Alcohol use-no Drug use-no Married Patient does not get regular exercise.   Review of Systems  The patient denies anorexia, fever, weight loss, weight gain, vision loss, decreased hearing, hoarseness, chest pain, syncope, dyspnea on exertion, peripheral edema, prolonged cough, headaches, hemoptysis, abdominal pain, melena, hematochezia, severe indigestion/heartburn, hematuria, incontinence, genital sores, muscle weakness, suspicious skin lesions, transient blindness, difficulty walking, depression, unusual weight change, abnormal bleeding, enlarged lymph nodes, angioedema, and breast masses.    Physical Exam  General:  alert and overweight-appearing.   Head:  normocephalic and no abnormalities palpated.   Eyes:  pupils equal and pupils reactive to light.   Ears:  R ear normal and L ear normal.   Nose:  no external deformity and no nasal discharge.   Mouth:  pharynx pink and moist and no erythema.   Neck:  No deformities, masses, or tenderness noted. Lungs:  normal respiratory effort and no wheezes.   Heart:  normal rate and regular rhythm.   Abdomen:  soft, no guarding, and epigastric tenderness.   Msk:  joint tenderness and joint swelling.   Extremities:  trace left pedal edema and trace  right pedal edema.   Neurologic:  alert & oriented X3, gait normal, and DTRs symmetrical and normal.     Impression & Recommendations:  Problem # 1:  DYSURIA, CHRONIC (ICD-788.1)  Orders: UA Dipstick w/o Micro (automated)  (81003)  Encouraged to push clear liquids, get enough rest, and take acetaminophen as needed. To be seen in 10 days if no improvement, sooner if worse.  Her updated medication list for this problem includes:    Cipro 250 Mg Tabs (Ciprofloxacin hcl) ..... One by mouth two times a day x 5 days  Problem # 2:  BACK PAIN, ACUTE (ICD-724.5)  Her updated medication list for this problem includes:    Adult Aspirin Ec Low Strength 81 Mg Tbec (Aspirin) ..... Once daily  Discussed use of moist heat or ice, modified activities, medications, and stretching/strengthening exercises. Back care instructions given. To be seen in 2 weeks if no improvement; sooner if worsening of symptoms.   Problem # 3:  LUMP OR MASS IN BREAST (ICD-611.72) I have spent greater that 30 min face to face evaluating this patient of which 1/2 was councilin gwith pt and her husbaqnd about choices  Complete Medication List: 1)  Tenoretic 50 50-25 Mg Tabs (Atenolol-chlorthalidone) .... 1/2 once daily in the morning 2)  Adult Aspirin Ec Low Strength  81 Mg Tbec (Aspirin) .... Once daily 3)  Lotrel 5-20 Mg Caps (Amlodipine besy-benazepril hcl) .... One by mouth daily 4)  Prevacid 15 Mg Cpdr (Lansoprazole) .... One by mouth once daily as needed 5)  Livalo 4 Mg Tabs (Pitavastatin calcium) .Marland Kitchen.. 1 one monday , wednesday and friday 6)  Vitamin D 1000 Unit Tabs (Cholecalciferol) .... One tablet by mouth once daily 7)  Cipro 250 Mg Tabs (Ciprofloxacin hcl) .... One by mouth two times a day x 5 days  Patient Instructions: 1)  Please schedule a follow-up appointment as needed. Prescriptions: CIPRO 250 MG TABS (CIPROFLOXACIN HCL) one by mouth two times a day x 5 days  #10 x 0   Entered by:   Lynann Beaver CMA    Authorized by:   [name] MD   Signed by:   Lynann Beaver CMA on 12/30/2009   Method used:   Electronically to        [facility] (retail)       [address]       [place], [place]  [address]       Ph: [phone]       Fax: [phone]   RxID:   [id number]   Laboratory Results   Urine Tests  Date/Time Recieved: December 30, 2009 3:03 PM  Date/Time Reported: December 30, 2009 3:03 PM   Routine Urinalysis   Color: yellow Appearance: Clear Glucose: negative   (Normal Range: Negative) Bilirubin: negative   (Normal Range: Negative) Ketone: negative   (Normal Range: Negative) Spec. Gravity: 1.020   (Normal Range: 1.003-1.035) Blood: negative   (Normal Range: Negative) pH: 5.0   (Normal Range: 5.0-8.0) Protein: negative   (Normal Range: Negative) Urobilinogen: 0.2   (Normal Range: 0-1) Nitrite: negative   (Normal Range: Negative) Leukocyte Esterace: 1+   (Normal Range: Negative)    Comments: Wynona Canes, CMA  December 30, 2009 3:03 PM

## 2010-11-29 NOTE — Progress Notes (Signed)
Summary: URI  Phone Note Call from Patient   Caller: Patient Call For: Dr. Lovell Sheehan Summary of Call: Pt. caught her husband's URI.  Complains of chills, cough, and sweats?  No fever.   Cough is productive but no color.   Nkda Can we call RX in to [facility] and Dennard Nip) [phone] Initial call taken by: Lynann Beaver CMA,  December 12, 2007 9:29 AM  Follow-up for Phone Call        atuss electronicall sent  notify pt please Follow-up by: [name] MD,  December 12, 2007 2:00 PM  Additional Follow-up for Phone Call Additional follow up Details #1::        Pt informed. Additional Follow-up by: Sid Falcon LPN,  December 12, 2007 3:50 PM    New/Updated Medications: ATUSS DS 30-4-30 MG/5ML  SUSP (PSEUDOEPHED HCL-CPM-DM HBR TAN) two ts by mouth q 4-6 hours   Prescriptions: ATUSS DS 30-4-30 MG/5ML  SUSP (PSEUDOEPHED HCL-CPM-DM HBR TAN) two ts by mouth q 4-6 hours  #6oz x 0   Entered and Authorized by:   [name] MD   Signed by:   Sid Falcon LPN on 32/20/2542   Method used:   Electronically sent to ...       [facility]       [address]       [place], [place]  [address]       Ph: [phone]       Fax: [phone]   RxID:   [id number] ATUSS DS 30-4-30 MG/5ML  SUSP (PSEUDOEPHED HCL-CPM-DM HBR TAN) two ts by mouth q 4-6 hours  #6oz x 0   Entered and Authorized by:   [name] MD   Signed by:   [name] MD on 12/12/2007   Method used:   Electronically sent to ...       [facility]       [address]       [place], [place]  [address]       Ph: [phone]       Fax: [phone]   RxID:   [id number]

## 2010-11-29 NOTE — Progress Notes (Signed)
Summary: change back to Lotrel  Phone Note Call from Patient   Caller: Patient Call For: Dr. Lovell Sheehan Summary of Call: Pt needs to go back on Lotrel...Marland KitchenMarland KitchenThe generic is making her cough and she is having some vague chest pain. Does not like the generics. Nicolette Bang [phone] Initial call taken by: Lynann Beaver CMA,  September 18, 2008 8:23 AM  Follow-up for Phone Call        my call in the brand Follow-up by: [name] MD,  September 18, 2008 1:25 PM

## 2010-11-29 NOTE — Assessment & Plan Note (Signed)
Summary: 3 month rov/njr   Vital Signs:  Patient profile:   70 year old female Weight:      274 pounds Temp:     98.4 degrees F oral Pulse rate:   72 / minute Pulse rhythm:   regular BP sitting:   136 / 80  Primary Care Provider:  [name], MD    History of Present Illness: Discussed the manaogram results for 2/15 with the possibility of additional view once reviewed with the prior films  Hypertension History:      She denies headache, chest pain, palpitations, dyspnea with exertion, orthopnea, PND, peripheral edema, visual symptoms, neurologic problems, syncope, and side effects from treatment.  at goal.        Positive major cardiovascular risk factors include female age 73 years old or older, hyperlipidemia, and hypertension.  Negative major cardiovascular risk factors include non-tobacco-user status.        Further assessment for target organ damage reveals no history of ASHD, stroke/TIA, or peripheral vascular disease.    Lipid Management History:      Positive NCEP/ATP III risk factors include female age 71 years old or older and hypertension.  Negative NCEP/ATP III risk factors include non-tobacco-user status, no ASHD (atherosclerotic heart disease), no prior stroke/TIA, no peripheral vascular disease, and no history of aortic aneurysm.        The patient states that she does not know about the "Therapeutic Lifestyle Change" diet.  Her compliance with the TLC diet is poor.      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 1/2 Once Daily in The Morning 2)  Adult Aspirin Ec Low Strength 81 Mg  Tbec (Aspirin) .... Once Daily 3)  Lotrel 5-20 Mg Caps (Amlodipine Besy-Benazepril Hcl) .... One By Mouth Daily 4)  Prevacid 15 Mg Cpdr (Lansoprazole) .... One By Mouth Once Daily As Needed 5)  Livalo 4 Mg Tabs (Pitavastatin Calcium) .Marland Kitchen.. 1 On Mondays 6)  Vitamin D 1000 Unit  Tabs (Cholecalciferol) .... One Tablet By Mouth Once Daily  Current Medications (verified): 1)  Tenoretic 50 50-25 Mg  Tabs (Atenolol-Chlorthalidone) .... 1/2 Once Daily in The Morning 2)  Adult Aspirin Ec Low Strength 81 Mg  Tbec (Aspirin) .... Once Daily 3)  Lotrel 5-20 Mg Caps (Amlodipine Besy-Benazepril Hcl) .... One By Mouth Daily 4)  Prevacid 15 Mg Cpdr (Lansoprazole) .... One By Mouth Once Daily As Needed 5)  Livalo 4 Mg Tabs (Pitavastatin Calcium) .Marland Kitchen.. 1 One Monday , Wednesday and Friday 6)  Vitamin D 1000 Unit  Tabs (Cholecalciferol) .... One Tablet By Mouth Once Daily  Allergies (verified): 1)  ! * Crestor  Past History:  Family History: Last updated: 11/15/2009 Family History of Breast Cancer:  Mother  Family History of Prostate Cancer: Father No FH of Colon Cancer:  Social History: Last updated: 06/30/2008 Never Smoked Alcohol use-no Drug use-no Married Patient does not get regular exercise.   Risk Factors:  Diet: eating too much (11/15/2009) Exercise: no (06/30/2008)  Risk Factors: Smoking Status: never (06/03/2007)  Past medical, surgical, family and social histories (including risk factors) reviewed, and no changes noted (except as noted below).  Past Medical History: Reviewed history from 11/15/2009 and no changes required. Hyperlipidemia Hypertension Obesity Diverticulitis, hx of Diverticulosis, colon abdominal pain unspecified constipation colon adenoma 2009  Past Surgical History: Reviewed history  from 11/15/2009 and no changes required. 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 Family History of Breast Cancer:  Mother  Family History of Prostate Cancer: Father No FH of Colon Cancer:  Social History: Reviewed history from 06/30/2008 and no changes required. Never Smoked Alcohol use-no Drug use-no Married Patient does not get regular exercise.   Review of Systems  The patient denies anorexia, fever, weight loss, weight gain, vision loss, decreased hearing, hoarseness, chest pain, syncope, dyspnea on exertion, peripheral edema, prolonged cough, headaches, hemoptysis, abdominal pain, melena, hematochezia, severe indigestion/heartburn, hematuria, incontinence, genital sores, muscle weakness, suspicious skin lesions, transient blindness, difficulty walking, depression, unusual weight change, abnormal bleeding, enlarged lymph nodes, angioedema, breast masses, and testicular masses.         weight gain  Physical Exam  General:  alert and overweight-appearing.   Head:  normocephalic and no abnormalities palpated.   Eyes:  pupils equal and pupils reactive to light.   Ears:  R ear normal and L ear normal.   Nose:  no external deformity and no nasal discharge.   Mouth:  pharynx pink and moist and no erythema.   Neck:  No deformities, masses, or tenderness noted. Lungs:  normal respiratory effort and no wheezes.   Heart:  normal rate and regular rhythm.   Abdomen:  soft, no guarding, and epigastric tenderness.   Msk:  joint tenderness and joint swelling.   Extremities:  trace left pedal edema and trace right pedal edema.   Neurologic:  alert & oriented X3, gait normal, and DTRs symmetrical and normal.     Impression & Recommendations:  Problem # 1:   OBESITY (ICD-278.00) Assessment Unchanged  weigth loss diet reviewed  Ht: 70 (11/15/2009)   Wt: 274 (12/16/2009)   BMI: 38.02 (11/15/2009)  Problem # 2:  GERD (ICD-530.81) Assessment: Improved  symptoms better with diet changes Her updated medication list for this problem includes:    Prevacid 15 Mg Cpdr (Lansoprazole) ..... One by mouth once daily as needed  Labs Reviewed: Hgb: 14.0 (08/23/2009)   Hct: 40.7 (08/23/2009)  Problem # 3:  HYPERTENSION (ICD-401.9) Assessment: Unchanged  Her updated medication list for this problem includes:    Tenoretic 50 50-25 Mg Tabs (Atenolol-chlorthalidone) .Marland Kitchen... 1/2 once daily in the morning    Lotrel 5-20 Mg Caps (Amlodipine besy-benazepril hcl) ..... One by mouth daily  BP today: 136/80 Prior BP: 126/72 (11/15/2009)  10 Yr Risk Heart Disease: 11 % Prior 10 Yr Risk Heart Disease: 8 % (09/16/2009)  Labs Reviewed: K+: 4.0 (09/04/2008) Creat: : 1.1 (09/04/2008)   Chol: 244 (12/09/2009)   HDL: 45.10 (12/09/2009)   LDL: 114 (09/04/2008)   TG: 158.0 (12/09/2009)  Problem # 4:  HYPERLIPIDEMIA (ICD-272.4) Assessment: Unchanged  Her updated medication list for this problem includes:    Livalo 4 Mg Tabs (Pitavastatin calcium) .Marland Kitchen... 1 one monday , wednesday and friday  Labs Reviewed: SGOT: 26 (12/09/2009)   SGPT:  23 (12/09/2009)  Lipid Goals: Chol Goal: 200 (02/13/2008)   HDL Goal: 40 (02/13/2008)   LDL Goal: 130 (02/13/2008)   TG Goal: 150 (02/13/2008)  10 Yr Risk Heart Disease: 11 % Prior 10 Yr Risk Heart Disease: 8 % (09/16/2009)   HDL:45.10 (12/09/2009), 42.50 (09/16/2009)  LDL:114 (09/04/2008), DEL (05/29/2008)  Chol:244 (12/09/2009), 230 (09/16/2009)  Trig:158.0 (12/09/2009), 123 (09/04/2008)  Complete Medication List: 1)  Tenoretic 50 50-25 Mg Tabs (Atenolol-chlorthalidone) .... 1/2 once daily in the morning 2)  Adult Aspirin Ec Low Strength 81 Mg Tbec (Aspirin) .... Once daily 3)  Lotrel 5-20 Mg Caps (Amlodipine besy-benazepril  hcl) .... One by mouth daily 4)  Prevacid 15 Mg Cpdr (Lansoprazole) .... One by mouth once daily as needed 5)  Livalo 4 Mg Tabs (Pitavastatin calcium) .Marland Kitchen.. 1 one monday , wednesday and friday 6)  Vitamin D 1000 Unit Tabs (Cholecalciferol) .... One tablet by mouth once daily  Hypertension Assessment/Plan:      The patient's hypertensive risk group is category B: At least one risk factor (excluding diabetes) with no target organ damage.  Her calculated 10 year risk of coronary heart disease is 11 %.  Today's blood pressure is 136/80.  Her blood pressure goal is < 140/90.  Lipid Assessment/Plan:      Based on NCEP/ATP III, the patient's risk factor category is "2 or more risk factors and a calculated 10 year CAD risk of < 20%".  The patient's lipid goals are as follows: Total cholesterol goal is 200; LDL cholesterol goal is 130; HDL cholesterol goal is 40; Triglyceride goal is 150.  Her LDL cholesterol goal has been met.    Patient Instructions: 1)  Please schedule a follow-up appointment in 2 months. 2)  Hepatic Panel prior to visit, ICD-9:995.20 3)  Lipid Panel prior to visit, ICD-9:272.4

## 2010-11-29 NOTE — Progress Notes (Signed)
Summary: rectal bleeding  Phone Note Call from Patient   Caller: Patient Call For: Dr. Lovell Sheehan Summary of Call: Pt. has had rectal bleeding today, but has appt with Dr. Clydene Pugh today.  She just wants to know if Crestor could cause this? [phone]  Bleeding is red and happens during her BM. She will not be back from her office visit with Dr. Clydene Pugh until late this afternoon. Initial call taken by: Lynann Beaver CMA,  January 16, 2008 12:39 PM  Follow-up for Phone Call        pt informed not crestor and to keep appontment with dr Leone Payor  Follow-up by: Willy Eddy, LPN,  January 16, 2008 3:13 PM

## 2010-11-29 NOTE — Consult Note (Signed)
Summary: [facility]   Imported By: Kassie Mends 12/02/2007 08:21:38  _____________________________________________________________________  External Attachment:    Type:   Image     Comment:   [facility]

## 2010-11-29 NOTE — Progress Notes (Signed)
Summary: mass lower leg  Phone Note Call from Patient   Caller: Judeth Cornfield from [facility] Summary of Call: Nurse calls stating that pt has a quarter sized mass left lower leg that is painful.  No redness or swelling. and no injuiry.  Concerned about DVT. What would you recommend? Initial call taken by: Lynann Beaver CMA,  September 18, 2007 2:18 PM  Follow-up for Phone Call        Spoke to Dr. Lovell Sheehan and he would like a U/S doppler to rule out DVT on Left leg.  Please call pt's home to schedule this for tomorrow.  Spoke to [employer] (nurse) and she will notify the patient.  Marina Goodell a request to schedule the test. Follow-up by: Lynann Beaver CMA,  September 18, 2007 2:32 PM

## 2010-11-29 NOTE — Assessment & Plan Note (Signed)
Summary: acute back pain/dm   Vital Signs:  Patient profile:   70 year old female Height:      70 inches Weight:      260 pounds BMI:     37.44 Temp:     98.6 degrees F oral Pulse rate:   80 / minute Resp:     14 per minute BP sitting:   130 / 80  (left arm) Cuff size:   large  Vitals Entered By: Willy Eddy, LPN (January 26, 2009 12:11 PM)  Primary Care Provider:  Richardo Priest  CC:  to er last week for uti-rx with cipro for 10 days and vicodan for pain.  History of Present Illness: was seen in the ER for pyelonephritis and back pain  on the left  the CT of the abdomin and pevis was consistant with left rental infalmation ( pyelo) The pt has significant pain  in the left pt feels that her left flank is swollen and had buring in the uring and a fever of 101. 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 1/2 Once Daily in The Morning 2)  Adult Aspirin Ec Low Strength 81 Mg  Tbec (Aspirin) .... Once Daily 3)  Pepcid Ac 10 Mg Tabs (Famotidine) .... Take 1 Tablet By Mouth Once A Day 4)  Lotrel 5-20 Mg Caps (Amlodipine Besy-Benazepril Hcl) .... One By Mouth Daily 5)  Trilipix 135 Mg Cpdr (Choline Fenofibrate) .... One By Mouth 6)  Trilipix 135 Mg Cpdr (Choline Fenofibrate) .... One By Mouth At Bed Time  Current Medications (verified): 1)  Tenoretic 50 50-25 Mg  Tabs (Atenolol-Chlorthalidone)  .... 1/2 Once Daily in The Morning 2)  Adult Aspirin Ec Low Strength 81 Mg  Tbec (Aspirin) .... Once Daily 3)  Pepcid Ac 10 Mg Tabs (Famotidine) .... Take 1 Tablet By Mouth Once A Day 4)  Lotrel 5-20 Mg Caps (Amlodipine Besy-Benazepril Hcl) .... One By Mouth Daily 5)  Trilipix 135 Mg Cpdr (Choline Fenofibrate) .... One By Mouth 6)  Trilipix 135 Mg Cpdr (Choline Fenofibrate) .... One By Mouth At Bed Time  Allergies (verified): 1)  ! * Crestor  Past History:  Family History:    Family History Other cancer-Breast, Prostate    Family History of Breast Cancer:  mother    No FH of Colon Cancer:    Family History of Prostate Cancer: Father     (06/30/2008)  Social History:    Never Smoked    Alcohol use-no    Drug use-no    Married    Patient does not get regular exercise.      (06/30/2008)  Risk Factors:    Alcohol Use: N/A    >5 drinks/d w/in last 3 months: N/A    Caffeine Use: N/A    Diet: N/A    Exercise: no (06/30/2008)  Risk Factors:    Smoking Status: never (06/03/2007)  Packs/Day: N/A    Cigars/wk: N/A    Pipe Use/wk: N/A    Cans of tobacco/wk: N/A    Passive Smoke Exposure: N/A  Past medical, surgical, family and social histories (including risk factors) reviewed, and no changes noted (except as noted below).  Past Medical History:    Reviewed history from 06/30/2008 and no changes required:    Hyperlipidemia    Hypertension    Obesity    Diverticulitis, hx of    Diverticulosis, colon    abdominal pain unspecified    constipation  Past Surgical History:    Reviewed history from 01/16/2008 and no changes required:    S/P skin graft for trauma    Hysterectomy    Bladder tack    Colonoscopy-2001, 2005    Oophorectomy    ureter repair  for transected left ureter    temporary stent    Colectomy for sigmoid diverticulosis  Family History:    Reviewed history from 06/30/2008 and no changes required:       Family History Other cancer-Breast, Prostate        Family History of Breast Cancer:  mother       No FH of Colon Cancer:       Family History of Prostate Cancer: Father  Social History:    Reviewed history from 06/30/2008 and no changes required:       Never Smoked       Alcohol use-no       Drug use-no       Married       Patient does not get regular exercise.   Review of Systems  The patient denies anorexia, fever, weight loss, weight gain, vision loss, decreased hearing, hoarseness, chest pain, syncope, dyspnea on exertion, peripheral edema, prolonged cough, headaches, hemoptysis, abdominal pain, melena, hematochezia, severe indigestion/heartburn, hematuria, incontinence, genital sores, muscle weakness, suspicious skin lesions, transient blindness, difficulty walking, depression, unusual weight change, abnormal bleeding, enlarged lymph nodes, angioedema, and breast masses.         dysuria and frequency  Physical Exam  General:  alert, well-developed, and overweight-appearing.   Head:  Normocephalic and atraumatic without obvious abnormalities. No apparent alopecia or balding. Ears:  R ear normal and L ear normal.   Nose:  no external deformity and no nasal discharge.   Mouth:  Oral mucosa and oropharynx without lesions or exudates.  Teeth in good repair. Neck:  No deformities, masses, or tenderness noted. Lungs:  clear anterior Heart:  Regular rate and rhythm; no murmurs, rubs,  or bruits. Abdomen:  obese, soft BS +. Some tenderness to deep palpation LUQ and LLQ. No masses. No rebound. Msk:  no joint tenderness, no joint swelling, and no joint warmth.   Pulses:  R and L carotid,radial,femoral,dorsalis pedis and posterior tibial pulses are full and equal bilaterally Extremities:  trace left pedal edema and trace right pedal edema.   Skin:  turgor normal and color normal.   Cervical Nodes:  No lymphadenopathy noted Axillary Nodes:  No palpable lymphadenopathy Psych:  Oriented X3 and memory intact for recent and remote.      Impression & Recommendations:  Problem # 1:  ABDOMINAL PAIN, LEFT UPPER AND LOWER (ICD-789.09)  CT with pyelo     Her updated medication list for this problem includes:    Adult Aspirin Ec Low Strength 81 Mg Tbec (Aspirin) ..... Once daily  Discussed use of medications, application of heat or cold, and exercises.   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 consult and cuture  Problem # 3:  OVERWEIGHT (ICD-278.02)  Ht: 70 (01/26/2009)   Wt: 260 (01/26/2009)   BMI: 37.44 (01/26/2009)  Complete Medication List: 1)  Tenoretic 50 50-25 Mg Tabs (Atenolol-chlorthalidone) .... 1/2 once daily in the morning 2)  Adult Aspirin Ec Low Strength 81 Mg Tbec (Aspirin) .... Once daily 3)  Pepcid Ac 10 Mg Tabs (Famotidine) .... Take 1 tablet by mouth once a day 4)  Lotrel 5-20 Mg Caps (Amlodipine besy-benazepril hcl) .... One by mouth daily 5)  Trilipix 135 Mg Cpdr (Choline fenofibrate) .... One by mouth 6)  Trilipix 135 Mg Cpdr (Choline fenofibrate) .... One by mouth at bed time  Patient Instructions: 1)  Take your antibiotic as prescribed until ALL of it is gone, but stop if you develop a rash or swelling and contact our office as soon as possible.  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

## 2010-11-29 NOTE — Progress Notes (Signed)
Summary: Vytorin samples  Phone Note From Other Clinic   Caller: Patient Call For: Dr. Lovell Sheehan Summary of Call: Pt. needs more samples of Vytorin to last until she comes 10/05/2008 for repeat labs. [phone] Initial call taken by: Lynann Beaver CMA,  September 28, 2008 2:35 PM    New/Updated Medications: SIMVASTATIN 40 MG TABS (SIMVASTATIN) 1 once daily   Prescriptions: SIMVASTATIN 40 MG TABS (SIMVASTATIN) 1 once daily  #30 x 6   Entered by:   Willy Eddy, LPN   Authorized by:   [name] MD   Signed by:   Willy Eddy, LPN on 45/40/9811   Method used:   Electronically to        [facility] (retail)       [address]       [place], [place]  [address]       Ph: [phone]       Fax: [phone]   RxID:   [id number]

## 2010-11-29 NOTE — Letter (Signed)
Summary: [facility]  [facility]   Imported By: Maryln Gottron 09/04/2008 15:26:21  _____________________________________________________________________  External Attachment:    Type:   Image     Comment:   External Document

## 2010-11-29 NOTE — Consult Note (Signed)
Summary: [facility]   Imported By: Maryln Gottron 01/19/2010 14:20:23  _____________________________________________________________________  External Attachment:    Type:   Image     Comment:   External Document

## 2010-11-29 NOTE — Procedures (Signed)
Summary: [facility]  [facility]   Imported By: Lowry Ram CMA 01/21/2008 16:58:44  _____________________________________________________________________  External Attachment:    Type:   Image     Comment:   External Document

## 2010-11-29 NOTE — Assessment & Plan Note (Signed)
Summary: /pt rescd from bump/ccm rsc per pt/njrRESCHEDLUE/MHF   Vital Signs:  Patient Profile:   70 Years Old Female Height:     70 inches Weight:      256 pounds Temp:     98.1 degrees F oral Pulse rate:   72 / minute Resp:     14 per minute BP sitting:   116 / 80  (left arm)  Vitals Entered By: Willy Eddy, LPN (October 02, 2007 10:26 AM)                 Hypertension History:      She denies headache, chest pain, palpitations, dyspnea with exertion, orthopnea, PND, peripheral edema, visual symptoms, neurologic problems, syncope, and side effects from treatment.  Further comments include: will reduce the BP medicine.        Positive major cardiovascular risk factors include female age 63 years old or older, hyperlipidemia, and hypertension.  Negative major cardiovascular risk factors include non-tobacco-user status.      Past Medical History:    Reviewed history from 09/09/2007 and no changes required:       Hyperlipidemia       Hypertension       Obesity       Diverticulitis, hx of       Diverticulosis, colon  Past Surgical History:    Reviewed history from 06/12/2007 and no changes required:       S/P skin graft for trauma       Hysterectomy       Bladder tack       Colonoscopy-2001       Oophorectomy   Family History:    Reviewed history from 06/03/2007 and no changes required:       Family History Other cancer-Breast, Prostate  Social History:    Reviewed history from 06/03/2007 and no changes required:       Never Smoked       Alcohol use-no       Drug use-no       Married    Review of Systems  The patient denies fever, decreased hearing, hoarseness, syncope, dyspnea on exhertion, peripheral edema, prolonged cough, abdominal pain, melena, hematochezia, and severe indigestion/heartburn.     Physical Exam  General:     overweight-appearing.   Head:     Normocephalic and atraumatic without obvious abnormalities. No apparent alopecia or  balding. Nose:     External nasal examination shows no deformity or inflammation. Nasal mucosa are pink and moist without lesions or exudates. Mouth:     Oral mucosa and oropharynx without lesions or exudates.  Teeth in good repair. Neck:     supple.   Lungs:     normal respiratory effort and no wheezes.   Heart:     normal rate and regular rhythm.  Grade  1 /6 systolic ejection murmur.   Abdomen:     obesesoft, non-tender, and normal bowel sounds.   Msk:     no joint tenderness, no joint swelling, and no joint warmth.   Pulses:     R and L carotid,radial,femoral,dorsalis pedis and posterior tibial pulses are full and equal bilaterally Extremities:     No clubbing, cyanosis, edema, or deformity noted with normal full range of motion of all joints.      Impression & Recommendations:  Problem # 1:  HYPERLIPIDEMIA (ICD-272.4)  Labs Reviewed: Chol: 279 (06/05/2007)   HDL: 39.4 (06/05/2007)   LDL: DEL (06/05/2007)  TG: 179 (06/05/2007) SGOT: 28 (06/05/2007)   SGPT: 27 (06/05/2007)  10 Yr Risk Heart Disease: Not enough information   Problem # 2:  OVERWEIGHT (ICD-278.02) weight loss is needed and should continue. Abddominal dehisence had improved  Problem # 3:  DIVERTICULOSIS, COLON (ICD-562.10) continue low residue diet. Had colectomy was scheduled for would revision in Dec.... in [place].   Problem # 4:  HYPERTENSION (ICD-401.9)  Her updated medication list for this problem includes:    Lotrel 5-10 Mg Caps (Amlodipine besy-benazepril hcl) ..... Once daily    Tenoretic 50 50-25 Mg Tabs (Atenolol-chlorthalidone) .Marland Kitchen... Take 1/2 tabet by mouth daily  BP today: 116/80 Prior BP: 140/80 (09/09/2007)  10 Yr Risk Heart Disease: Not enough information  Labs Reviewed: Creat: 1.1 (09/09/2007) Chol: 279 (06/05/2007)   HDL: 39.4 (06/05/2007)   LDL: DEL (06/05/2007)   TG: 179 (06/05/2007)   Medications Added to Medication List This Visit: 1)  Tenoretic 50 50-25 Mg Tabs  (Atenolol-chlorthalidone) .... Take 1/2 tabet by mouth daily  Complete Medication List: 1)  Lotrel 5-10 Mg Caps (Amlodipine besy-benazepril hcl) .... Once daily 2)  Tenoretic 50 50-25 Mg Tabs (Atenolol-chlorthalidone) .... Take 1/2 tabet by mouth daily  Hypertension Assessment/Plan:      The patient's hypertensive risk group is category B: At least one risk factor (excluding diabetes) with no target organ damage.  Today's blood pressure is 116/80.  Her blood pressure goal is < 140/90.   Patient Instructions: 1)  Please schedule a follow-up appointment in 2 months. 2)  Hepatic Panel prior to visit, ICD-9:  995.20 3)  Lipid Panel prior to visit, ICD-9:272.4     Hypertension History:      She denies headache, chest pain, palpitations, dyspnea with exertion, orthopnea, PND, peripheral edema, visual symptoms, neurologic problems, syncope, and side effects from treatment.  Further comments include: will reduce the BP medicine.        Positive major cardiovascular risk factors include female age 4 years old or older, hyperlipidemia, and hypertension.  Negative major cardiovascular risk factors include non-tobacco-user status.     ]

## 2010-11-29 NOTE — Progress Notes (Signed)
Summary: knee brace  Phone Note Call from Patient   Summary of Call: Patient call requesting a brace for her knee to be sent to [facility]? Fax [phone] attn: Whitney Muse. Initial call taken by: Josph Macho RMA,  August 02, 2010 2:43 PM  Follow-up for Phone Call        OK to give an order for a brace for right knee for pain to [facility] Follow-up by: Danise Edge MD,  August 02, 2010 5:31 PM  Additional Follow-up for Phone Call Additional follow up Details #1::        Will fax Additional Follow-up by: Josph Macho RMA,  August 03, 2010 10:29 AM

## 2010-11-29 NOTE — Progress Notes (Signed)
Summary: ?PREP  Phone Note Call from Patient Call back at Home Phone [phone]   Caller: Patient Call For: Willy Pinkerton Reason for Call: Talk to Nurse Summary of Call: HAS ? RE PREP INSTRUCTION AND REGLAN Initial call taken by: Tawni Levy,  August 25, 2008 4:42 PM  Follow-up for Phone Call        phone call from pt.  Per pt the pharm did not give her the reglan rx.  Pt call pharm and they said rx was there but would be 1 1/2 hours befor it would be ready to be picked up.  Pt advised to take one reglan when she picked it up in 1 1/2 hours. Follow-up by: Eual Fines RN,  August 25, 2008 4:51 PM

## 2010-11-29 NOTE — Letter (Signed)
Summary: [facility] note  [facility] note   Imported By: Kassie Mends 07/15/2007 15:02:02  _____________________________________________________________________  External Attachment:    Type:   Image     Comment:   [facility] note

## 2010-11-29 NOTE — Assessment & Plan Note (Signed)
Summary: KNEE PAIN/PS   Vital Signs:  Patient profile:   70 year old female Height:      70 inches (177.80 cm) Weight:      277 pounds (125.91 kg) O2 Sat:      97 % on Room air Temp:     97.7 degrees F (36.50 degrees C) oral Pulse rate:   73 / minute BP sitting:   142 / 90  (left arm) Cuff size:   large  Vitals Entered By: Josph Macho RMA (July 18, 2010 9:17 AM)  O2 Flow:  Room air CC: Right knee pain X2 months off and on/ CF Is Patient Diabetic? No   History of Present Illness: patient is a 70 year old African American female with worsening right-sided knee pain. She has a long history of well-documented arthritis and pain in her left knee but over the years has not had nearly as much difficulty with her right knee. Unfortunately over the last 2 months the right knee pain has begun to worsen. The pain and some swelling, warmth is most notable on the medial aspect of her right knee and at this point is essentially constant. She reports a very distant history of a fall on that knee roughly 20 years ago but no significant difficulty in 2 months more recently. She describes the pain as intense and keeping her up at night, causing her difficulty with ambulation. She describes it as feeling somewhat hot and swollen and painful the pain is diffuse across the entire anterior knee no calf pain, no posterior knee pain is noted the pain is most extreme on the medial anterior portion of the knee. No chest pain, palpitations, shortness of breath, fevers, chills, GI or GU complaints otherwise noted.  Current Medications (verified): 1)  Tenoretic 50 50-25 Mg  Tabs (Atenolol-Chlorthalidone) .... 1/2 Once Daily in The Morning 2)  Adult Aspirin Ec Low Strength 81 Mg  Tbec (Aspirin) .... Once Daily 3)  Lotrel 5-20 Mg Caps (Amlodipine Besy-Benazepril Hcl) .... One By Mouth Daily 4)  Prevacid 15 Mg Cpdr (Lansoprazole) .... One By Mouth Once Daily As Needed 5)  Crestor 20 Mg Tabs (Rosuvastatin  Calcium) .... One By Mouth Mwf 6)  Vitamin D 1000 Unit  Tabs (Cholecalciferol) .... One Tablet By Mouth Once Daily 7)  Aleve 220 Mg Tabs (Naproxen Sodium) .... Two By Mouth Two Times A Day 8)  Zantac 150 Mg Tabs (Ranitidine Hcl) .Marland Kitchen.. 1 Tab By Mouth Two Times A Day For Heartburn 9)  Nitrostat 0.4 Mg Subl (Nitroglycerin) .Marland Kitchen.. 1 Tab Sl As Needed Cp May Repeat Q 5 Min X 3 Doses If No Relief To Er  Allergies (verified): 1)  ! * Crestor  Past History:  Past medical history reviewed for relevance to current acute and chronic problems. Social history (including risk factors) reviewed for relevance to current acute and chronic problems.  Past Medical History: Reviewed history from 11/15/2009 and no changes required. Hyperlipidemia Hypertension Obesity Diverticulitis, hx of Diverticulosis, colon abdominal pain unspecified constipation colon adenoma 2009  Social History: Reviewed history from 06/30/2008 and no changes required. Never Smoked Alcohol use-no Drug use-no Married Patient does not get regular exercise.   Review of Systems      See HPI  Physical Exam  General:  Well-developed,well-nourished,in no acute distress; alert,appropriate and cooperative throughout examination. Obese Head:  Normocephalic and atraumatic without obvious abnormalities. No apparent alopecia or balding. Mouth:  Oral mucosa and oropharynx without lesions or exudates.  Teeth in good repair. Neck:  No deformities, masses, or tenderness noted. Lungs:  Normal respiratory effort, chest expands symmetrically. Lungs are clear to auscultation, no crackles or wheezes. Heart:  Normal rate and regular rhythm. S1 and S2 normal without gallop, murmur, click, rub or other extra sounds. Abdomen:  Bowel sounds positive,abdomen soft and non-tender without masses, organomegaly or hernias noted. Pulses:  R and L dorsalis pedis and posterior tibial pulses are full and equal bilaterally Extremities:  No clubbing, cyanosis,  edema noted. Left knee without any obvious swelling/warmth or redness. No ligamentous laxity noted in either knee. Right knee is painful to palp over medial meniscus with some swelling noted in same area. No other joint line tenderness is noted. Cervical Nodes:  No lymphadenopathy noted Psych:  Cognition and judgment appear intact. Alert and cooperative with normal attention span and concentration. No apparent delusions, illusions, hallucinations   Impression & Recommendations:  Problem # 1:  KNEE PAIN, RIGHT, ACUTE (ICD-719.46)  Her updated medication list for this problem includes:    Adult Aspirin Ec Low Strength 81 Mg Tbec (Aspirin) ..... Once daily    Aleve 220 Mg Tabs (Naproxen sodium) .Marland Kitchen..Marland Kitchen Two by mouth two times a day    Tramadol Hcl 50 Mg Tabs (Tramadol hcl) .Marland Kitchen... 1 tab by mouth three times a day as needed pain, use primarily at bedtime  Orders: T-Knee Comp Right 4 Views (73564TC) TLB-Sedimentation Rate (ESR) (85652-ESR) TLB-Uric Acid, Blood (84550-URIC) Venipuncture (16109) Specimen Handling (60454) Orthopedic Referral (Ortho) Recommend 1 Aleve in am, 2 extra strength Tylenol around noon and a Tramadol at bedtime to help her rest. Topical Aspercreme as needed. Referred to ortho for further management  Problem # 2:  OBESITY (ICD-278.00) Encouraged decreased by mouth intake, complex carbs and lean proteins  Problem # 3:  EPIGASTRIC PAIN (ICD-789.06) H Pylori treatment is completed but still having some reflux symptoms, encouraged to take Zantac daily for the next few months to help the healing process, report worsening symptoms.  Complete Medication List: 1)  Tenoretic 50 50-25 Mg Tabs (Atenolol-chlorthalidone) .... 1/2 once daily in the morning 2)  Adult Aspirin Ec Low Strength 81 Mg Tbec (Aspirin) .... Once daily 3)  Lotrel 5-20 Mg Caps (Amlodipine besy-benazepril hcl) .... One by mouth daily 4)  Prevacid 15 Mg Cpdr (Lansoprazole) .... One by mouth once daily as needed 5)   Crestor 20 Mg Tabs (Rosuvastatin calcium) .... One by mouth mwf 6)  Vitamin D 1000 Unit Tabs (Cholecalciferol) .... One tablet by mouth once daily 7)  Aleve 220 Mg Tabs (Naproxen sodium) .... Two by mouth two times a day 8)  Zantac 150 Mg Tabs (Ranitidine hcl) .Marland Kitchen.. 1 tab by mouth two times a day for heartburn 9)  Nitrostat 0.4 Mg Subl (Nitroglycerin) .Marland Kitchen.. 1 tab sl as needed cp may repeat q 5 min x 3 doses if no relief to er 10)  Tramadol Hcl 50 Mg Tabs (Tramadol hcl) .Marland Kitchen.. 1 tab by mouth three times a day as needed pain, use primarily at bedtime  Other Orders: UA Dipstick w/o Micro (automated)  (81003)  Patient Instructions: 1)  Take 650 - 1000 mg of tylenol every 4-6 hours as needed for relief of pain or comfort of fever. Avoid taking more than 3000 mg in a 24 hour period( can cause liver damage in higher doses).  2)  You may move around but avoid painful motions. Apply ice to sore area for 20 minutes 3-4 times a day for 2-3 days.  3)  Try one Aleve in  am, 2 ES Tylenol at lunchtime and Tramadol in evening. Use Aspercreme topically as needed.  4)  Take Zantac daily 5)  Avoid foods high in acid(tomatoes, citrus juices,spicy foods).Avoid eating within two hours of lying down or before exercising. Do not over eat: try smaller more frequent meals. Elevate head of bed twelve inches when sleeping.  Prescriptions: TRAMADOL HCL 50 MG TABS (TRAMADOL HCL) 1 tab by mouth three times a day as needed pain, use primarily at bedtime  #60 x 1   Entered and Authorized by:   Danise Edge MD   Signed by:   Danise Edge MD on 07/18/2010   Method used:   Electronically to        [facility] (retail)       [address]       [place], [place]  [address]       Ph: [phone]       Fax: [phone]   RxID:   [id number]   Laboratory Results   Urine Tests    Routine Urinalysis   Color: yellow Appearance: Clear Glucose: negative   (Normal Range: Negative) Bilirubin:  negative   (Normal Range: Negative) Ketone: negative   (Normal Range: Negative) Spec. Gravity: 1.010   (Normal Range: 1.003-1.035) Blood: negative   (Normal Range: Negative) pH: 5.5   (Normal Range: 5.0-8.0) Protein: negative   (Normal Range: Negative) Urobilinogen: 0.2   (Normal Range: 0-1) Nitrite: negative   (Normal Range: Negative) Leukocyte Esterace: negative   (Normal Range: Negative)    Comments: Rita Ohara  July 18, 2010 10:37 AM

## 2010-11-29 NOTE — Progress Notes (Signed)
Summary: KIDNEY INFECTION  Phone Note Call from Patient Call back at [phone]   Caller: PT LIVE Call For: [name] MD Reason for Call: Acute Illness Summary of Call: PATIENT IS STILL HAVING PROBLEM WITH THE KIDNEY INFECTION. Initial call taken by: Celine Ahr,  February 02, 2009 8:34 AM  Follow-up for Phone Call        left message on machine fpr [pt. per dr Lovell Sheehan come by and given  specimen and run ua and c and s Follow-up by: Willy Eddy, LPN,  February 02, 2009 12:34 PM

## 2010-11-29 NOTE — Progress Notes (Signed)
Summary: med question   Phone Note Call from Patient Call back at Work Phone [phone] Call back at cell [phone]   Caller: pt live Call For: Lovell Sheehan Summary of Call: patient says you called in atinolol 50/25 and amoldbenacp 5/10 mg  and they were to be generic and it would be three meds now.  [facility] says it is only 2 meds and they are the same.   Initial call taken by: Roselle Locus,  August 21, 2008 2:41 PM      Prescriptions: TENORETIC 50 50-25 MG  TABS (ATENOLOL-CHLORTHALIDONE) 1/2 once daily in the morning  #90 x 3   Entered by:   Lynann Beaver CMA   Authorized by:   [name] MD   Signed by:   Lynann Beaver CMA on 08/21/2008   Method used:   Electronically to        [facility] (retail)       [address]       [place], [place]  [address]       Ph: [phone]       Fax: [phone]   RxID:   [id number]  Pt. notified.

## 2010-11-29 NOTE — Assessment & Plan Note (Signed)
Summary: problem with bowels   History of Present Illness Visit Type: follow up Primary GI MD: Stan Head MD [facility] Primary Provider: Richardo Priest Chief Complaint:  pt c/o lt sided abd. pain. She had surgery in dec. feels like "the food will not pass" through bowels , also causes pain.  pt states that it is hard to swallow since surgery also.  now has a cough as well. History of Present Illness:   Awakened with LUQ and LLQ pain 5 days ago and couldn't move bowels, then took prune juice and had bowel movements and felt much better but that lasted x 3 days. No fever but abdomen is "hot" after she eats. First time with trouble like this. Feels ok now but concerned about these problems coming back. No similar problems like this since surgery. Has not been constipated at all.   No nausea or vomiting.   She is producing mucoid phlegm in AM and decsribes some sinus drainage, but she ? if she is having food come up at these times. No dysphagia.    No urinary problems.             Prior Medications Reviewed Using: Medication Bottles  Updated Prior Medication List: LOTREL 5-10 MG  CAPS (AMLODIPINE BESY-BENAZEPRIL HCL) one by mouth at bed time TENORETIC 50 50-25 MG  TABS (ATENOLOL-CHLORTHALIDONE) 1/2 once daily in the morning VYTORIN 10-40 MG  TABS (EZETIMIBE-SIMVASTATIN) 1/2 at bed time ADULT ASPIRIN EC LOW STRENGTH 81 MG  TBEC (ASPIRIN) once daily MAXICHLOR PSE DM 60-4-20 MG  TABS (PSEUDOEPH-CHLORPHEN-DM) one by mouth every 6 bhours as needed PEPCID AC 10 MG TABS (FAMOTIDINE) Take 1 tablet by mouth once a day  Current Allergies (reviewed today): ! * CRESTOR  Past Medical History:    Hyperlipidemia    Hypertension    Obesity    Diverticulitis, hx of    Diverticulosis, colon    abdominal pain unspecified    constipation  Past Surgical History:    Reviewed history from 01/16/2008 and no changes required:       S/P skin graft for trauma       Hysterectomy       Bladder tack      Colonoscopy-2001, 2005       Oophorectomy       ureter repair  for transected left ureter       temporary stent       Colectomy for sigmoid diverticulosis    Family History:    Family History Other cancer-Breast, Prostate    Family History of Breast Cancer:  mother    No FH of Colon Cancer:    Family History of Prostate Cancer: Father  Social History:    Never Smoked    Alcohol use-no    Drug use-no    Married    Patient does not get regular exercise.    Risk Factors:  Exercise:  no    Vital Signs:  Patient Profile:   70 Years Old Female Height:     70 inches Weight:      251 pounds BMI:     36.14 BSA:     2.30 Pulse rate:   68 / minute Pulse rhythm:   regular BP sitting:   110 / 66  (right arm)  Vitals Entered By: Paulene Floor, RN (June 30, 2008 3:09 PM)                  Physical Exam  General:  Well developed, well nourished, no acute distress. Obese Lungs:     clear anterior Heart:     Regular rate and rhythm; no murmurs, rubs,  or bruits. Abdomen:     obese, soft BS +. Some tenderness to deep palpation LUQ and LLQ. No masses. No rebound. Rectal:     deferred until time of colonoscopy.   Neurologic:     a&o x 3    Impression & Recommendations:  Problem # 1:  CHANGE IN BOWELS (EAV-409.81) Assessment: New Some new constipation difficulty with hx of complicated diverticulitis and resection. Seems better but she is very concerned about this change. ? of recurrent diverticulitis (doubt based upon physical exam, but could be smoldering), adhesions?, transiebnt functional constipation?, post-op stricture or stenosis.  Orders: Colonoscopy (Colon) Risks, benefits,and indications of endoscopic procedure(s) were reviewed with the patient and all questions answered.    Problem # 2:  ABDOMINAL PAIN, LEFT UPPER AND LOWER (ICD-789.09) Assessment: New See # 1 also Orders: TLB-CBC Platelet - w/Differential (85025-CBCD)   Problem # 3:   DIVERTICULITIS, HX OF (ICD-V12.79)   Patient Instructions: 1)  Continue daily prune juice for constipation    Prescriptions: DULCOLAX 5 MG  TBEC (BISACODYL) Day before procedure take 2 at 3pm and 2 at 8pm.  #4 x 0   Entered by:   Francee Piccolo CMA   Authorized by:   Iva Boop MD   Signed by:   Francee Piccolo CMA on 06/30/2008   Method used:   Electronically to        [facility] (retail)       [address]       [place], [place]  [address]       Ph: [phone]       Fax: [phone]   RxID:   [id number] REGLAN 10 MG  TABS (METOCLOPRAMIDE HCL) As per prep instructions.  #2 x 0   Entered by:   Francee Piccolo CMA   Authorized by:   Iva Boop MD   Signed by:   Francee Piccolo CMA on 06/30/2008   Method used:   Electronically to        [facility] (retail)       [address]       [place], [place]  [address]       Ph: [phone]       Fax: [phone]   RxID:   [id number] MIRALAX   POWD (POLYETHYLENE GLYCOL 3350) As per prep  instructions.  #255gm x 0   Entered by:   Francee Piccolo CMA   Authorized by:   Iva Boop MD   Signed by:   Francee Piccolo CMA on 06/30/2008   Method used:   Electronically to        [facility] (retail)       [address]       [place], [place]  [address]       Ph: [phone]       Fax: [phone]   RxID:   [id number]  ]

## 2010-11-29 NOTE — Assessment & Plan Note (Signed)
Summary: ?gout/njr   Vital Signs:  Patient Profile:   70 Years Old Female Height:     70 inches Temp:     98.7 degrees F oral Pulse rate:   76 / minute Resp:     14 per minute BP sitting:   140 / 80  (left arm)                 Chief Complaint:  c/opossible gout in left great toe./red and swollen and painful.discharged from hospital last saturday.  History of Present Illness: acute gout    Past Medical History:    Reviewed history from 06/03/2007 and no changes required:       Hyperlipidemia       Hypertension       Obesity       Diverticulitis, hx of       Diverticulosis, colon   Family History:    Reviewed history from 06/03/2007 and no changes required:       Family History Other cancer-Breast, Prostate  Social History:    Reviewed history from 06/03/2007 and no changes required:       Never Smoked       Alcohol use-no       Drug use-no       Married     Physical Exam  General:     overweight-appearing.   Lungs:     normal respiratory effort and no wheezes.   Heart:     normal rate and regular rhythm.   Msk:     joint tenderness, joint swelling, and joint warmth.  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 One by mouth two times a day for 2 weeks   Problem # 2:  DIVERTICULITIS, HX OF (ICD-V12.79) on antibiotics  Complete Medication List: 1)  Lotrel 5-10 Mg Caps (Amlodipine besy-benazepril hcl) .... Once daily 2)  Tenoretic 50 50-25 Mg Tabs (Atenolol-chlorthalidone) .... Take 1 tablet by mouth once a day 3)  Cipro 500 Mg Tabs (Ciprofloxacin hcl) 4)  Flagyl 250 Mg Tabs (Metronidazole) .... Four times a day 5)  Colchicine 0.6 Mg Tabs (Colchicine) .... One by mouth two  times a day for 2 weeks  Other Orders: TLB-BMP (Basic Metabolic Panel-BMET) (80048-METABOL)   Patient Instructions: 1)  Please schedule a follow-up appointment as needed.    Prescriptions: COLCHICINE 0.6 MG  TABS (COLCHICINE) one by mouth two times a day for 2 weeks  #30 x 0   Entered and Authorized by:   [name] MD   Signed by:   [name] MD on 09/09/2007   Method used:   Print then Give to Patient   RxID:   [id number]  ]

## 2010-11-29 NOTE — Progress Notes (Signed)
Summary: triage   Phone Note Call from Patient   Caller: Spouse Call For: Dr. Lovell Sheehan Summary of Call: Pt.called to let us know she would be late today..........Marland Kitchenbut she is not scheduled.......Marland KitchenHas appt 02/02 2009.  Must have been a mix up on the phone call yesterday.  Will check with Dr. Lovell Sheehan and see if that appt. is suitable.  Call was lost while attempting 3 way call with daughter and pt. Initial call taken by: Lynann Beaver CMA,  November 26, 2007 3:57 PM  Follow-up for Phone Call        Pt. is in lobby and c/o elevated BP and having swelling. Triaged pt........BP is 140/76 Resp 12 Pulse 76  No significant swelling noted and in no acute distress except some emotional symptoms. Advised to see Dr. Lovell Sheehan Thursday at 11:45 am. Any GI c/o or post surgical complaints should be directed at her surgeons. Go to the ER if concerned about any issues before she sees [place] in 2 days. This was discussed with Dr. Lovell Sheehan. Pt will continue to take the double dose of BP meds until appt with Dr. Lovell Sheehan. Follow-up by: Lynann Beaver CMA,  November 26, 2007 4:03 PM

## 2010-11-29 NOTE — Progress Notes (Signed)
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

## 2010-11-29 NOTE — Progress Notes (Signed)
Summary: come back after antibotic?  Phone Note Call from Patient Call back at Home Phone [phone] Call back at Work Phone [phone] Call back at W -here rest of day   Caller: vm Call For: [name] Summary of Call: Does Dr. Abner Greenspan want me to come back after I finish antibiotic?   Initial call taken by: Rudy Jew, RN,  July 13, 2010 1:21 PM  Follow-up for Phone Call        Only if she wants to or is not feeling all the way better Follow-up by: Danise Edge MD,  July 13, 2010 2:15 PM  Additional Follow-up for Phone Call Additional follow up Details #1::        Patient informed Additional Follow-up by: Josph Macho RMA,  July 13, 2010 2:21 PM

## 2010-11-29 NOTE — Assessment & Plan Note (Signed)
Summary: np6/chest pains   Visit Type:  Follow-up Primary Provider:  [name], MD   CC:  chest pain.  History of Present Illness: The patient is referred for evaluation of chest discomfort. She has no prior cardiac history other than a treadmill stress test years ago and what sounds like a stress echocardiogram prior to surgery about 6 years ago at [facility]. She has never been told she had any heart disease. Over the last several months she's been having some chest discomfort. She points to her left axillary area and describes discomfort that feels like a fullness. She can now related to eating pork. She does not describe any associated nausea vomiting or diaphoresis. She does not describe radiation to her jaw or down her arms. She does not have palpitations, presyncope or syncope. She does not have PND or orthopnea. She is somewhat limited by gout. She does her usual activities and climbs 4 stairs routinely without bringing on any symptoms. Of note her EKG demonstrates a left bundle branch block. This was present on her previous EKG in March and prior to this but I do not see how far back it dates.  Current Medications (verified): 1)  Tenoretic 50 50-25 Mg  Tabs (Atenolol-Chlorthalidone) .... 1/2 Once Daily in The Morning 2)  Adult Aspirin Ec Low Strength 81 Mg  Tbec (Aspirin) .... Once Daily 3)  Lotrel 5-20 Mg Caps (Amlodipine Besy-Benazepril Hcl) .... One By Mouth Daily 4)  Prevacid 15 Mg Cpdr (Lansoprazole) .... One By Mouth Once Daily As Needed 5)  Crestor 20 Mg Tabs (Rosuvastatin Calcium) .... One By Mouth Mwf 6)  Vitamin D 1000 Unit  Tabs (Cholecalciferol) .... One Tablet By Mouth Once Daily 7)  Aleve 220 Mg Tabs (Naproxen Sodium) .... Two By Mouth Two Times A Day 8)  Zantac 150 Mg Tabs (Ranitidine Hcl) .Marland Kitchen.. 1 Tab By Mouth Two Times A Day For Heartburn 9)  Nitrostat 0.4 Mg Subl (Nitroglycerin) .Marland Kitchen.. 1 Tab Sl As Needed Cp May Repeat Q 5 Min X 3 Doses If No Relief To Er 10)  Tramadol Hcl  50 Mg Tabs (Tramadol Hcl) .Marland Kitchen.. 1 Tab By Mouth Three Times A Day As Needed Pain, Use Primarily At Bedtime 11)  Allopurinol 100 Mg Tabs (Allopurinol) .... By Mouth Daily 12)  Colcrys 0.6 Mg Tabs (Colchicine) .... Take 1 Tab By Mouth Q2 Hrs As Needed For Pain Until Pain Relief/intolerable Diarrhea or Has Taken 6 Tabs in 24 Hours  Allergies (verified): 1)  ! * Crestor  Past History:  Past Medical History: Hyperlipidemia x 1 year Hypertension x 10 years Obesity Diverticulitis, hx of Diverticulosis, colon Abdominal pain unspecified Constipation Colon adenoma 2009  Past Surgical History: S/P skin graft for trauma Hysterectomy Bladder tack Colonoscopy-2001, 2005, 2009 Oophorectomy Ureter repair  for transected left ureter Temporary ureter stent Colectomy for sigmoid diverticulosis  Family History: Reviewed history from 11/15/2009 and no changes required. Family History of Breast Cancer:  Mother  Family History of Prostate Cancer: Father No FH of Colon Cancer:  Social History: Reviewed history from 06/30/2008 and no changes required. Never Smoked Alcohol use-no Drug use-no Married Patient does not get regular exercise.   Review of Systems       As stated in the HPI and negative for all other systems.   Vital Signs:  Patient profile:   70 year old female Height:      70 inches Weight:      275 pounds BMI:     39.60 Pulse  rate:   60 / minute Resp:     18 per minute BP sitting:   143 / 78  (right arm)  Vitals Entered By: Marrion Coy, CNA (July 26, 2010 3:36 PM)  Physical Exam  General:  Well developed, well nourished, in no acute distress. Head:  normocephalic and atraumatic Eyes:  PERRLA/EOM intact; conjunctiva and lids normal. Neck:  Neck supple, no JVD. No masses, thyromegaly or abnormal cervical nodes. Chest Wall:  no deformities or breast masses noted Lungs:  Clear bilaterally to auscultation and percussion. Abdomen:  Bowel sounds positive; abdomen  soft and non-tender without masses, organomegaly, or hernias noted. No hepatosplenomegaly. Msk:  Back normal, normal gait. Muscle strength and tone normal. Extremities:  No clubbing or cyanosis. Neurologic:  Alert and oriented x 3. Skin:  Intact without lesions or rashes. Cervical Nodes:  no significant adenopathy Psych:  Normal affect.   Detailed Cardiovascular Exam  Neck    Carotids: Carotids full and equal bilaterally without bruits.      Neck Veins: Normal, no JVD.    Heart    Inspection: no deformities or lifts noted.      Palpation: normal PMI with no thrills palpable.      Auscultation: regular rate and rhythm, S1, S2 without murmurs, rubs, gallops, or clicks.    Vascular    Abdominal Aorta: no palpable masses, pulsations, or audible bruits.      Femoral Pulses: normal femoral pulses bilaterally.      Pedal Pulses: normal pedal pulses bilaterally.      Radial Pulses: normal radial pulses bilaterally.      Peripheral Circulation: no clubbing, cyanosis, or edema noted with normal capillary refill.     EKG  Procedure date:  07/26/2010  Findings:      Sinus rhythm, rate 60, left bundle branch block  Impression & Recommendations:  Problem # 1:  CHEST PAIN, ATYPICAL (ICD-786.59) The patient's chest pain is very atypical. It happens with eating certain foods. At this point I think the pretest probability of obstructive coronary disease is very low and so I would not suggest further cardiovascular testing. Certainly if symptoms worsen or change in character or quality they should be reconsidered. She does need primary risk reduction. Orders: EKG w/ Interpretation (93000) Echocardiogram (Echo)  Problem # 2:  OBESITY (ICD-278.00) We discussed the need to lose weight with diet and exercise and I gave her very specific instructions.  Problem # 3:  LEFT BUNDLE BRANCH BLOCK (ICD-426.3) I will order an echocardiogram to further evaluate this. Otherwise am not suggesting further  testing.  Patient Instructions: 1)  Your physician recommends that you schedule a follow-up appointment is needed 2)  Your physician recommends that you continue on your current medications as directed. Please refer to the Current Medication list given to you today. 3)  Your physician has requested that you have an echocardiogram.  Echocardiography is a painless test that uses sound waves to create images of your heart. It provides your doctor with information about the size and shape of your heart and how well your heart's chambers and valves are working.  This procedure takes approximately one hour. There are no restrictions for this procedure.

## 2010-11-29 NOTE — Assessment & Plan Note (Signed)
Summary: 3 month ov//ccm   Vital Signs:  Patient profile:   70 year old female Height:      70 inches Weight:      272 pounds BMI:     39.17 Temp:     98.9 degrees F oral Pulse rate:   68 / minute Resp:     14 per minute BP sitting:   124 / 74  (left arm) Cuff size:   large  Vitals Entered By: Willy Eddy, LPN (August 11, 2010 11:02 AM) CC: f/u of gout, Lipid Management, Hypertension Management Is Patient Diabetic? No   Primary Care Provider:  [name], MD   CC:  f/u of gout, Lipid Management, and Hypertension Management.  History of Present Illness: pt with hx of gout, htn and hyperlipidemai present for follow up has hx of abominal surgery mild pain from constipation, no fever no weight loss, not exercizing lipid monitering due  Hypertension History:      She denies headache, chest pain, palpitations, dyspnea with exertion, orthopnea, PND, peripheral edema, visual symptoms, neurologic problems, syncope, and side effects from treatment.        Positive major cardiovascular risk factors include female age 71 years old or older, hyperlipidemia, and hypertension.  Negative major cardiovascular risk factors include non-tobacco-user status.        Further assessment for target organ damage reveals no history of ASHD, stroke/TIA, or peripheral vascular disease.    Lipid Management History:      Positive NCEP/ATP III risk factors include female age 44 years old or older, HDL cholesterol less than 40, and hypertension.  Negative NCEP/ATP III risk factors include non-tobacco-user status, no ASHD (atherosclerotic heart disease), no prior stroke/TIA, no peripheral vascular disease, and no history of aortic aneurysm.        The patient states that she knows about the "Therapeutic Lifestyle Change" diet.  Her compliance with the TLC diet is poor.      Preventive Screening-Counseling & Management  Alcohol-Tobacco     Smoking Status: never     Tobacco Counseling: not  indicated; no tobacco use  Problems Prior to Update: 1)  Left Bundle Branch Block  (ICD-426.3) 2)  Helicobacter Pylori Gastritis  (ICD-041.86) 3)  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) 4)  Chest Pain, Atypical  (ICD-786.59) 5)  Fatigue  (ICD-780.79) 6)  Epigastric Pain  (ICD-789.06) 7)  Hyperglycemia  (ICD-790.29) 8)  Suprapubic Pain  (ICD-789.09) 9)  Cellulitis and Abscess of Other Specified Site  (ICD-682.8) 10)  Lump or Mass in Breast  (ICD-611.72) 11)  Obesity  (ICD-278.00) 12)  Back Pain, Acute  (ICD-724.5) 13)  Dysuria, Chronic  (ICD-788.1) 14)  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) 15)  Gerd  (ICD-530.81) 16)  Abdominal Pain, Left Upper and Lower  (ICD-789.09) 17)  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) 18)  Diverticulosis, Colon  (ICD-562.10) 19)  Diverticulitis, Hx of  (ICD-V12.79) 20)  Gout, Unspecified  (ICD-274.9) 21)  Hypertension  (ICD-401.9) 22)  Hyperlipidemia  (ICD-272.4)  Current Problems (verified): 1)  Left Bundle Branch Block  (ICD-426.3) 2)  Helicobacter Pylori Gastritis  (ICD-041.86) 3)  Knee Pain, Right, Acute  (ICD-719.46) 4)  Chest Pain, Atypical  (ICD-786.59) 5)  Fatigue  (ICD-780.79) 6)  Epigastric Pain  (ICD-789.06) 7)  Hyperglycemia  (ICD-790.29) 8)  Suprapubic Pain  (ICD-789.09) 9)  Cellulitis and Abscess of Other Specified Site  (ICD-682.8) 10)  Lump or Mass in Breast  (  ICD-611.72) 11)  Obesity  (ICD-278.00) 12)  Back Pain, Acute  (ICD-724.5) 13)  Dysuria, Chronic  (ICD-788.1) 14)  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 1/2 Once Daily in The Morning 2)  Adult Aspirin Ec Low Strength 81 Mg  Tbec (Aspirin) .... Once Daily 3)  Lotrel 5-20 Mg Caps (Amlodipine Besy-Benazepril Hcl) .... One By Mouth Daily 4)  Prevacid 15 Mg Cpdr (Lansoprazole) .... One By Mouth Once Daily As Needed 5)  Crestor 20 Mg Tabs (Rosuvastatin Calcium) .... One By Mouth Mwf 6)  Vitamin D 1000 Unit  Tabs (Cholecalciferol) .... One Tablet By Mouth Once Daily 7)  Aleve 220 Mg Tabs (Naproxen Sodium) .... Two By Mouth Two Times A Day 8)  Zantac 150 Mg Tabs (Ranitidine Hcl) .Marland Kitchen.. 1 Tab By Mouth Two Times A Day For Heartburn 9)  Nitrostat 0.4 Mg Subl (Nitroglycerin) .Marland Kitchen.. 1 Tab Sl As Needed Cp May Repeat Q 5 Min X 3 Doses If No Relief To Er 10)  Tramadol Hcl 50 Mg Tabs (Tramadol Hcl) .Marland Kitchen.. 1 Tab By Mouth Three Times A Day As Needed Pain, Use Primarily At Bedtime 11)  Allopurinol 100 Mg Tabs (Allopurinol) .... By Mouth Daily 12)  Colcrys 0.6 Mg Tabs (Colchicine) .... Take 1 Tab By Mouth Q2 Hrs As Needed For Pain Until Pain Relief/intolerable Diarrhea or Has Taken 6 Tabs in 24 Hours  Current Medications (verified): 1)  Tenoretic 50 50-25 Mg  Tabs (Atenolol-Chlorthalidone) .... 1/2 Once Daily in The Morning 2)  Adult Aspirin Ec Low Strength 81 Mg  Tbec (Aspirin) .... Once Daily 3)  Lotrel 5-20 Mg Caps (Amlodipine Besy-Benazepril Hcl) .... One By Mouth Daily 4)  Prevacid 15 Mg Cpdr (Lansoprazole) .... One By Mouth Once Daily As Needed 5)  Crestor 20 Mg Tabs (Rosuvastatin Calcium) .... One By Mouth Mwf 6)  Vitamin D 1000 Unit  Tabs (Cholecalciferol) .... One Tablet By Mouth Once Daily 7)  Nitrostat 0.4 Mg Subl (Nitroglycerin) .Marland Kitchen.. 1 Tab Sl As Needed Cp May Repeat Q 5 Min X 3 Doses If No Relief To Er 8)  Tramadol Hcl 50 Mg Tabs (Tramadol Hcl) .Marland Kitchen.. 1 Tab By Mouth Three Times A Day As Needed Pain, Use Primarily At Bedtime 9)  Allopurinol 100 Mg Tabs  (Allopurinol) .... By Mouth Daily 10)  Align  Caps (Probiotic Product) .Marland Kitchen.. 1 Once Daily  Allergies (verified): 1)  ! * Crestor  Past History:  Family History: Last updated: 11/15/2009 Family History of Breast Cancer:  Mother  Family History of Prostate Cancer: Father No FH of Colon Cancer:  Social History: Last updated: 06/30/2008 Never Smoked Alcohol use-no Drug use-no Married Patient does not get regular exercise.   Risk Factors: Diet: eating too much (11/15/2009) Exercise: no (06/30/2008)  Risk Factors: Smoking Status: never (08/11/2010)  Past medical, surgical, family and social histories (including risk factors) reviewed, and no changes noted (except as noted below).  Past Medical History: Reviewed history from 07/26/2010 and no changes required. Hyperlipidemia x 1 year Hypertension x 10 years Obesity Diverticulitis, hx of Diverticulosis, colon Abdominal pain unspecified  Constipation Colon adenoma 2009  Past Surgical History: Reviewed history from 07/26/2010 and no changes required. S/P skin graft for trauma Hysterectomy Bladder tack Colonoscopy-2001, 2005, 2009 Oophorectomy Ureter repair  for transected left ureter Temporary ureter stent Colectomy for sigmoid diverticulosis  Family History: Reviewed history from 11/15/2009 and no changes required. Family History of Breast Cancer:  Mother  Family History of Prostate Cancer: Father No FH of Colon Cancer:  Social History: Reviewed history from 06/30/2008 and no changes required. Never Smoked Alcohol use-no Drug use-no Married Patient does not get regular exercise.   Review of Systems  The patient denies anorexia, fever, weight loss, weight gain, vision loss, decreased hearing, hoarseness, chest pain, syncope, dyspnea on exertion, peripheral edema, prolonged cough, headaches, hemoptysis, abdominal pain, melena, hematochezia, severe indigestion/heartburn, hematuria, incontinence, genital sores,  muscle weakness, suspicious skin lesions, transient blindness, difficulty walking, depression, unusual weight change, abnormal bleeding, enlarged lymph nodes, angioedema, and breast masses.    Physical Exam  General:  Well-developed,well-nourished,in no acute distress; alert,appropriate and cooperative throughout examination. Obese Head:  Normocephalic and atraumatic without obvious abnormalities. No apparent alopecia or balding. Eyes:  pupils equal and pupils round.   Ears:  R ear normal and L ear normal.   Nose:  no external deformity and no nasal discharge.   Neck:  No deformities, masses, or tenderness noted. Lungs:  Normal respiratory effort, chest expands symmetrically. Lungs are clear to auscultation, no crackles or wheezes. Heart:  Normal rate and regular rhythm. S1 and S2 normal without gallop, murmur, click, rub or other extra sounds.   Impression & Recommendations:  Problem # 1:  KNEE PAIN, RIGHT, ACUTE (ICD-719.46)  The following medications were removed from the medication list:    Aleve 220 Mg Tabs (Naproxen sodium) .Marland Kitchen..Marland Kitchen Two by mouth two times a day Her updated medication list for this problem includes:    Adult Aspirin Ec Low Strength 81 Mg Tbec (Aspirin) ..... Once daily    Tramadol Hcl 50 Mg Tabs (Tramadol hcl) .Marland Kitchen... 1 tab by mouth three times a day as needed pain, use primarily at bedtime  Discussed strengthening exercises, use of ice or heat, and medications.   Problem # 2:  EPIGASTRIC PAIN (ICD-789.06) align daily, IBS and diverticulosis Discussed symptom control with the patient.   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 Once daily    Tramadol Hcl 50 Mg  Tabs (Tramadol hcl) .Marland Kitchen... 1 tab by mouth three times a day as needed pain, use primarily at bedtime  Orders: Joint Aspirate / Injection, Large (20610) Depo- Medrol 40mg  (J1030)  Problem # 4:  HYPERTENSION (ICD-401.9) Assessment: Unchanged  Her updated medication list for this problem includes:    Tenoretic 50 50-25 Mg Tabs (Atenolol-chlorthalidone) .Marland Kitchen... 1/2 once daily in the morning    Lotrel 5-20 Mg Caps (Amlodipine besy-benazepril hcl) ..... One by mouth daily  BP today: 124/74 Prior BP: 143/78 (07/26/2010)  Prior 10 Yr Risk Heart Disease: 13 % (05/12/2010)  Labs Reviewed: K+: 3.8 (07/01/2010) Creat: : 1.1 (07/01/2010)   Chol: 214 (08/04/2010)   HDL: 39.60 (08/04/2010)   LDL: 114 (09/04/2008)   TG: 161.0 (08/04/2010)  Complete Medication List: 1)  Tenoretic 50  50-25 Mg Tabs (Atenolol-chlorthalidone) .... 1/2 once daily in the morning 2)  Adult Aspirin Ec Low Strength 81 Mg Tbec (Aspirin) .... Once daily 3)  Lotrel 5-20 Mg Caps (Amlodipine besy-benazepril hcl) .... One by mouth daily 4)  Prevacid 15 Mg Cpdr (Lansoprazole) .... One by mouth once daily as needed 5)  Crestor 20 Mg Tabs (Rosuvastatin calcium) .... One by mouth mwf 6)  Vitamin D 1000 Unit Tabs (Cholecalciferol) .... One tablet by mouth once daily 7)  Nitrostat 0.4 Mg Subl (Nitroglycerin) .Marland Kitchen.. 1 tab sl as needed cp may repeat q 5 min x 3 doses if no relief to er 8)  Tramadol Hcl 50 Mg Tabs (Tramadol hcl) .Marland Kitchen.. 1 tab by mouth three times a day as needed pain, use primarily at bedtime 9)  Allopurinol 100 Mg Tabs (Allopurinol) .... By mouth daily 10)  Align Caps (Probiotic product) .Marland Kitchen.. 1 once daily  Hypertension Assessment/Plan:      The patient's hypertensive risk group is category B: At least one risk factor (excluding diabetes) with no target organ damage.  Her calculated 10 year risk of coronary heart disease is 13 %.  Today's blood pressure is 124/74.  Her blood pressure goal is < 140/90.  Lipid Assessment/Plan:       Based on NCEP/ATP III, the patient's risk factor category is "0-1 risk factors".  The patient's lipid goals are as follows: Total cholesterol goal is 200; LDL cholesterol goal is 130; HDL cholesterol goal is 40; Triglyceride goal is 150.  Her LDL cholesterol goal has been met.    Patient Instructions: 1)  stay on the allopurinla 2)  do not take aleve 3)  Please schedule a follow-up appointment in 3 months.

## 2010-11-29 NOTE — Progress Notes (Signed)
Summary: heart rate low   Phone Note Call from Patient Call back at Home Phone [phone]   Caller: pt vm triage Call For: Lovell Sheehan Summary of Call: heart rate low please call pt  Initial call taken by: Roselle Locus,  April 03, 2008 12:36 PM  Follow-up for Phone Call        pt requested OV, HR 63 and cannot hear HR with her daughter's stethoscope. Scheduled OV today with Dr Amador Cunas Follow-up by: Sid Falcon LPN,  April 03, 3418 2:51 PM

## 2010-11-29 NOTE — Assessment & Plan Note (Signed)
Summary: Ongoing cough with wheezing and SOB/nn   Vital Signs:  Patient Profile:   70 Years Old Female Height:     70 inches Weight:      250 pounds Temp:     98.5 degrees F oral Pulse rate:   80 / minute Resp:     14 per minute BP sitting:   140 / 70  (left arm)  Vitals Entered By: Willy Eddy, LPN (Mar 10, 2008 9:38 AM)                 Chief Complaint:  c/o cough and congestion and URI symptoms.  History of Present Illness:  URI Symptoms      This is a 70 year old woman who presents with URI symptoms.  one month hx of cough.  The patient reports productive cough, but denies sore throat.  Associated symptoms include stiff neck and wheezing.  The patient denies fever and low-grade fever (<100.5 degrees).  The patient also reports itchy watery eyes, sneezing, muscle aches, and severe fatigue.  The patient denies headache.  The patient denies the following risk factors for Strep sinusitis: poor response to decongestant.      Current Allergies: No known allergies   Past Medical History:    Reviewed history from 09/09/2007 and no changes required:       Hyperlipidemia       Hypertension       Obesity       Diverticulitis, hx of       Diverticulosis, colon  Past Surgical History:    Reviewed history from 01/16/2008 and no changes required:       S/P skin graft for trauma       Hysterectomy       Bladder tack       Colonoscopy-2001, 2005       Oophorectomy       ureter repair  for transected left ureter       temporary stent       Colectomy for sigmoid diverticulosis   Family History:    Reviewed history from 06/03/2007 and no changes required:       Family History Other cancer-Breast, Prostate  Social History:    Reviewed history from 06/03/2007 and no changes required:       Never Smoked       Alcohol use-no       Drug use-no       Married    Review of Systems       The patient complains of decreased hearing, hoarseness, and prolonged cough.  The  patient denies fever, weight loss, weight gain, abdominal pain, melena, hematochezia, severe indigestion/heartburn, and hematuria.     Physical Exam  General:     overweight-appearing.   Head:     Normocephalic and atraumatic without obvious abnormalities. No apparent alopecia or balding. Eyes:     No corneal or conjunctival inflammation noted. EOMI. Perrla. Funduscopic exam benign, without hemorrhages, exudates or papilledema. Vision grossly normal. Mouth:     Oral mucosa and oropharynx without lesions or exudates.  Teeth in good repair. Neck:     No deformities, masses, or tenderness noted. Lungs:     normal respiratory effort, no accessory muscle use, and normal breath sounds.   Heart:     normal rate and regular rhythm.   Abdomen:     Bowel sounds positive,abdomen soft and non-tender without masses, organomegaly or hernias noted.    Impression & Recommendations:  Problem # 1:  ACUTE SINUSITIS, UNSPECIFIED (ICD-461.9) Instructed on treatment. Call if symptoms persist or worsen.  Her updated medication list for this problem includes:    Doxycycline Hyclate 100 Mg Cpep (Doxycycline hyclate) .Marland Kitchen... 1 by mouth two times a day    Maxichlor Pse Dm 60-4-20 Mg Tabs (Pseudoeph-chlorphen-dm) ..... One by mouth every 6 bhours as needed   Problem # 2:  OVERWEIGHT (ICD-278.02)  Orders: TLB-Hepatic/Liver Function Pnl (80076-HEPATIC)   Problem # 3:  HYPERTENSION (ICD-401.9)  Her updated medication list for this problem includes:    Lotrel 5-10 Mg Caps (Amlodipine besy-benazepril hcl) ..... Once daily    Tenoretic 50 50-25 Mg Tabs (Atenolol-chlorthalidone) .Marland Kitchen... 1 once daily  BP today: 140/70 Prior BP: 140/80 (02/13/2008)  Prior 10 Yr Risk Heart Disease: Not enough information (10/02/2007)  Labs Reviewed: Creat: 1.2 (12/09/2007) Chol: 270 (12/04/2007)   HDL: 42.0 (12/04/2007)   LDL: DEL (12/04/2007)   TG: 141 (12/04/2007)  Orders: TLB-BMP (Basic Metabolic Panel-BMET)  (80048-METABOL)   Complete Medication List: 1)  Lotrel 5-10 Mg Caps (Amlodipine besy-benazepril hcl) .... Once daily 2)  Tenoretic 50 50-25 Mg Tabs (Atenolol-chlorthalidone) .Marland Kitchen.. 1 once daily 3)  Crestor 5 Mg Tabs (Rosuvastatin calcium) .... One by mouth daily 4)  Adult Aspirin Ec Low Strength 81 Mg Tbec (Aspirin) .... Once daily 5)  Doxycycline Hyclate 100 Mg Cpep (Doxycycline hyclate) .Marland Kitchen.. 1 by mouth two times a day 6)  Maxichlor Pse Dm 60-4-20 Mg Tabs (Pseudoeph-chlorphen-dm) .... One by mouth every 6 bhours as needed  Other Orders: TLB-Lipid Panel (80061-LIPID)   Patient Instructions: 1)  Take your antibiotic as prescribed until ALL of it is gone, but stop if you develop a rash or swelling and contact our office as soon as possible. 2)  Please schedule a follow-up appointment in 3 months.   Prescriptions: MAXICHLOR PSE DM 60-4-20 MG  TABS (PSEUDOEPH-CHLORPHEN-DM) one by mouth every 6 bhours as needed  #30 x 0   Entered and Authorized by:   [name] MD   Signed by:   [name] MD on 03/10/2008   Method used:   Electronically sent to ...       [facility]       [address]       [place], [place]  [address]       Ph: [phone]       Fax: [phone]   RxID:   [id number] DOXYCYCLINE HYCLATE 100 MG  CPEP (DOXYCYCLINE HYCLATE) 1 by mouth two times a day  #28 x 0   Entered and Authorized by:   [name] MD   Signed by:   [name] MD on 03/10/2008   Method used:   Electronically sent to ...       [facility]       [address]       [place], [place]  [address]       Ph: [phone]       Fax: [phone]   RxID:   [id number]  ]

## 2010-11-29 NOTE — Progress Notes (Signed)
Summary: Colonoscopy now 08-26-08  Phone Note Call from Patient   Call For: [name] CMA Summary of Call: Pt forgot she has to be at court on the 08-13-08 so we rescheduled again the colonoscopy to 08-26-08 @ 10:30AM to arrive in the [facility] @ 9:30AM.  Pt advised to have nothing after 6:30AM.  [name] CMA

## 2010-11-29 NOTE — Letter (Signed)
Summary: [facility]   Imported By: Maryln Gottron 05/09/2010 10:58:01  _____________________________________________________________________  External Attachment:    Type:   Image     Comment:   External Document

## 2010-11-29 NOTE — Letter (Signed)
Summary: Generic Letter  [place] [facility]  [address] [place], [place] [address]   Phone: [phone]  Fax: [phone]    03/10/2008  SIL[facility]IA HIGHTOWER [address] [place], [place]  [address]  Dear Ms. Dea,  Due to your arthritic conditipon you will require cart tranportation between connections at the airport. Please show this letter to the check in agent so that they can arrange for you flight to be met by a transportaton cart    Sincerely,   [name] MD  [facility] [place]

## 2010-11-29 NOTE — Assessment & Plan Note (Signed)
Summary: 3 month rov/njr reschedule with patient per bonnye/mhf   Vital Signs:  Patient Profile:   70 Years Old Female Height:     70 inches Weight:      258 pounds Temp:     98.4 degrees F oral Pulse rate:   80 / minute Resp:     14 per minute BP sitting:   132 / 80  (left arm)  Vitals Entered By: Willy Eddy, LPN (October 06, 2008 11:46 AM)                 PCP:  Richardo Priest  Chief Complaint:  roa.  History of Present Illness: Follow up of lipids, obesity and HTN  Hyperlipidemia Follow-Up      This is a 70 year old woman who presents for Hyperlipidemia follow-up.  The patient denies muscle aches, GI upset, abdominal pain, flushing, itching, constipation, diarrhea, and fatigue.  The patient denies the following symptoms: chest pain/pressure, exercise intolerance, dypsnea, palpitations, syncope, and pedal edema.  Compliance with medications (by patient report) has been near 100%.  Dietary compliance has been excellent.  The patient reports no exercise.     Has buring in chest  mild stermal that radiates to the left arm but is not exertional and has not pressure or shortness of breath, the chest discomfort happens mainly at night when lays need mamogram  Hypertension History:      She denies headache, chest pain, palpitations, dyspnea with exertion, orthopnea, PND, peripheral edema, visual symptoms, neurologic problems, syncope, and side effects from treatment.        Positive major cardiovascular risk factors include female age 69 years old or older, hyperlipidemia, and hypertension.  Negative major cardiovascular risk factors include non-tobacco-user status.        Further assessment for target organ damage reveals no history of ASHD, stroke/TIA, or peripheral vascular disease.    Lipid Management History:      Positive NCEP/ATP III risk factors include female age 69 years old or older, HDL cholesterol less than 40, and hypertension.  Negative NCEP/ATP III risk  factors include non-tobacco-user status, no ASHD (atherosclerotic heart disease), no prior stroke/TIA, no peripheral vascular disease, and no history of aortic aneurysm.        The patient states that she knows about the "Therapeutic Lifestyle Change" diet.  Her compliance with the TLC diet is poor.        Prior Medication List:  TENORETIC 50 50-25 MG  TABS (ATENOLOL-CHLORTHALIDONE) 1/2 once daily in the morning SIMVASTATIN 40 MG TABS (SIMVASTATIN) 1 once daily ADULT ASPIRIN EC LOW STRENGTH 81 MG  TBEC (ASPIRIN) once daily MAXICHLOR PSE DM 60-4-20 MG  TABS (PSEUDOEPH-CHLORPHEN-DM) one by mouth every 6 bhours as needed PEPCID AC 10 MG TABS (FAMOTIDINE) Take 1 tablet by mouth once a day LOTREL 5-20 MG CAPS (AMLODIPINE BESY-BENAZEPRIL HCL) one by mouth daily   Current Allergies (reviewed today): ! * CRESTOR  Past Medical History:    Reviewed history from 06/30/2008 and no changes required:       Hyperlipidemia       Hypertension       Obesity       Diverticulitis, hx of       Diverticulosis, colon       abdominal pain unspecified       constipation  Past Surgical History:    Reviewed history from 01/16/2008 and no changes required:       S/P skin graft for trauma  Hysterectomy       Bladder tack       Colonoscopy-2001, 2005       Oophorectomy       ureter repair  for transected left ureter       temporary stent       Colectomy for sigmoid diverticulosis   Family History:    Reviewed history from 06/30/2008 and no changes required:       Family History Other cancer-Breast, Prostate       Family History of Breast Cancer:  mother       No FH of Colon Cancer:       Family History of Prostate Cancer: Father  Social History:    Reviewed history from 06/30/2008 and no changes required:       Never Smoked       Alcohol use-no       Drug use-no       Married       Patient does not get regular exercise.    Risk Factors: Tobacco use:  never Drug use:  no Alcohol use:   no Exercise:  no  Colonoscopy History:    Date of Last Colonoscopy:  08/26/2008   Review of Systems  The patient denies anorexia, fever, weight loss, weight gain, vision loss, decreased hearing, hoarseness, chest pain, syncope, dyspnea on exertion, peripheral edema, prolonged cough, headaches, hemoptysis, abdominal pain, melena, hematochezia, severe indigestion/heartburn, hematuria, incontinence, genital sores, muscle weakness, suspicious skin lesions, transient blindness, difficulty walking, depression, unusual weight change, abnormal bleeding, enlarged lymph nodes, angioedema, and breast masses.     Physical Exam  General:     Well-developed,well-nourished,in no acute distress; alert,appropriate and cooperative throughout examination Eyes:     No corneal or conjunctival inflammation noted. EOMI. Perrla. Funduscopic exam benign, without hemorrhages, exudates or papilledema. Vision grossly normal. Nose:     External nasal examination shows no deformity or inflammation. Nasal mucosa are pink and moist without lesions or exudates. Mouth:     Oral mucosa and oropharynx without lesions or exudates.  Teeth in good repair. Neck:     No deformities, masses, or tenderness noted. Lungs:     clear anterior Heart:     Regular rate and rhythm; no murmurs, rubs,  or bruits. Abdomen:     obese, soft BS +. Some tenderness to deep palpation LUQ and LLQ. No masses. No rebound. Msk:     no joint tenderness, no joint swelling, and no joint warmth.   Extremities:     trace left pedal edema and trace right pedal edema.   Neurologic:     a&o x 3    Impression & Recommendations:  Problem # 1:  HYPERTENSION (ICD-401.9)  Her updated medication list for this problem includes:    Tenoretic 50 50-25 Mg Tabs (Atenolol-chlorthalidone) .Marland Kitchen... 1/2 once daily in the morning    Lotrel 5-20 Mg Caps (Amlodipine besy-benazepril hcl) ..... One by mouth daily  BP today: 132/80 Prior BP: 110/66  (06/30/2008)  10 Yr Risk Heart Disease: 13 % Prior 10 Yr Risk Heart Disease: Not enough information (10/02/2007)  Labs Reviewed: Creat: 1.1 (09/04/2008) Chol: 176 (09/04/2008)   HDL: 37.6 (09/04/2008)   LDL: 114 (09/04/2008)   TG: 123 (09/04/2008)   Problem # 2:  HYPERLIPIDEMIA (ICD-272.4)  Her updated medication list for this problem includes:    Simvastatin 40 Mg Tabs (Simvastatin) .Marland Kitchen... 1 once daily  Labs Reviewed: Chol: 176 (09/04/2008)   HDL: 37.6 (09/04/2008)   LDL: 114 (  09/04/2008)   TG: 123 (09/04/2008) SGOT: 28 (09/04/2008)   SGPT: 23 (09/04/2008)  Lipid Goals: Chol Goal: 200 (02/13/2008)   HDL Goal: 40 (02/13/2008)   LDL Goal: 130 (02/13/2008)   TG Goal: 150 (02/13/2008)  10 Yr Risk Heart Disease: 13 % Prior 10 Yr Risk Heart Disease: Not enough information (10/02/2007)   Problem # 3:  GERD (ICD-530.81) has to wait 3 hours before bed ( eats to close to bed time Her updated medication list for this problem includes:    Pepcid Ac 10 Mg Tabs (Famotidine) .Marland Kitchen... Take 1 tablet by mouth once a day has seen gesner, and   Diagnostics Reviewed:  Discussed lifestyle modifications, diet, antacids/medications, and preventive measures. Handout provided.   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 Once daily Discussed use of medications, application of heat or cold, and exercises.   Complete Medication List: 1)  Tenoretic 50 50-25 Mg Tabs (Atenolol-chlorthalidone) .... 1/2 once daily in the morning 2)  Simvastatin 40 Mg Tabs (Simvastatin) .Marland Kitchen.. 1 once daily 3)  Adult Aspirin Ec Low Strength 81 Mg Tbec (Aspirin) .... Once daily 4)  Maxichlor Pse Dm 60-4-20 Mg Tabs (Pseudoeph-chlorphen-dm) .... One by mouth every 6 bhours as needed 5)  Pepcid Ac 10 Mg  Tabs (Famotidine) .... Take 1 tablet by mouth once a day 6)  Lotrel 5-20 Mg Caps (Amlodipine besy-benazepril hcl) .... One by mouth daily  Hypertension Assessment/Plan:      The patient's hypertensive risk group is category B: At least one risk factor (excluding diabetes) with no target organ damage.  Her calculated 10 year risk of coronary heart disease is 13 %.  Today's blood pressure is 132/80.  Her blood pressure goal is < 140/90.  Lipid Assessment/Plan:      Based on NCEP/ATP III, the patient's risk factor category is "2 or more risk factors and a calculated 10 year CAD risk of < 20%".  From this information, the patient's calculated lipid goals are as follows: Total cholesterol goal is 200; LDL cholesterol goal is 130; HDL cholesterol goal is 40; Triglyceride goal is 150.  Her LDL cholesterol goal has been met.     Patient Instructions: 1)  Please schedule a follow-up appointment in 3 months.   ]

## 2010-11-29 NOTE — Assessment & Plan Note (Signed)
Summary: 2 MONTH ROV/NJR   Vital Signs:  Patient profile:   70 year old female Height:      70 inches Weight:      261 pounds BMI:     37.58 Temp:     98.2 degrees F rectal Pulse rate:   80 / minute Resp:     14 per minute BP sitting:   140 / 80  (left arm) Cuff size:   large  Vitals Entered By: Willy Eddy, LPN (Mar 09, 2009 2:15 PM)  Primary Care Provider:  Richardo Priest  CC:  roa- nottaking trilipix.  History of Present Illness: review of the consult with GU and the surgical changes from the cut ureter dueing prior surgery the urologist discussed the possibility for pyelonephriis given her anatomy we reveiw these finding and of note she has shome increase frequency of urination She also notes some slight increase in ear wax from  both ears( no pain)   Hypertension History:      She denies headache, chest pain, palpitations, dyspnea with exertion, orthopnea, PND, peripheral edema, visual symptoms, neurologic problems, syncope, and side effects from treatment.        Positive major cardiovascular risk factors include female age 19 years old or older, hyperlipidemia, and hypertension.  Negative major cardiovascular risk factors include non-tobacco-user status.        Further assessment for target organ damage reveals no history of ASHD, stroke/TIA, or peripheral vascular disease.     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 1/2 Once Daily in The Morning 2)  Adult Aspirin Ec Low Strength 81 Mg  Tbec (Aspirin) .... Once Daily 3)  Pepcid Ac 10 Mg Tabs (Famotidine) .... Take 1 Tablet By Mouth Once A Day 4)  Lotrel 5-20 Mg Caps (Amlodipine Besy-Benazepril Hcl) .... One By Mouth Daily 5)  Trilipix 135 Mg Cpdr (Choline Fenofibrate) .... One By Mouth 6)  Trilipix 135 Mg Cpdr (Choline Fenofibrate) .... One By Mouth At Bed Time  Current Medications (verified): 1)  Tenoretic 50 50-25 Mg  Tabs (Atenolol-Chlorthalidone) .... 1/2 Once Daily in The Morning 2)  Adult Aspirin Ec Low Strength 81 Mg  Tbec (Aspirin) .... Once Daily 3)  Pepcid Ac 10 Mg Tabs (Famotidine) .... Take 1 Tablet By Mouth Once A Day 4)  Lotrel 5-20 Mg Caps (Amlodipine Besy-Benazepril Hcl) .... One By Mouth Daily 5)  Trilipix 135 Mg Cpdr (Choline Fenofibrate) .... One By Mouth-Not Taking  Allergies (verified): 1)  ! * Crestor  Past History:  Family History:    Family History Other cancer-Breast, Prostate    Family History of Breast Cancer:  mother    No FH of Colon Cancer:    Family History of Prostate Cancer: Father     (06/30/2008)  Social History:    Never Smoked    Alcohol use-no    Drug  use-no    Married    Patient does not get regular exercise.      (06/30/2008)  Risk Factors:    Alcohol Use: N/A    >5 drinks/d w/in last 3 months: N/A    Caffeine Use: N/A    Diet: N/A    Exercise: no (06/30/2008)  Risk Factors:    Smoking Status: never (06/03/2007)    Packs/Day: N/A    Cigars/wk: N/A    Pipe Use/wk: N/A    Cans of tobacco/wk: N/A    Passive Smoke Exposure: N/A  Past medical, surgical, family and social histories (including risk factors) reviewed, and no changes noted (except as noted below).  Past Medical History:    Hyperlipidemia    Hypertension    Obesity    Diverticulitis, hx of    Diverticulosis, colon     abdominal pain unspecified    constipation  Past Surgical History:    Reviewed history from 01/16/2008 and no changes required:    S/P skin graft for trauma    Hysterectomy    Bladder tack    Colonoscopy-2001, 2005    Oophorectomy    ureter repair  for transected left ureter    temporary stent    Colectomy for sigmoid diverticulosis  Family History:    Reviewed history from 06/30/2008 and no changes required:       Family History Other cancer-Breast, Prostate       Family History of Breast Cancer:  mother       No FH of Colon Cancer:       Family History of Prostate Cancer: Father  Social History:    Reviewed history from 06/30/2008 and no changes required:       Never Smoked       Alcohol use-no       Drug use-no       Married       Patient does not get regular exercise.   Review of Systems       The patient complains of decreased hearing.  The patient denies anorexia, fever, weight loss, weight gain, vision loss, hoarseness, chest pain, syncope, dyspnea on exertion, peripheral edema, prolonged cough, headaches, hemoptysis, abdominal pain, melena, hematochezia, severe indigestion/heartburn, hematuria, incontinence, genital sores, muscle weakness, suspicious skin lesions, transient blindness, difficulty walking, depression, unusual weight change, abnormal bleeding, enlarged lymph nodes, angioedema, breast masses, and testicular masses.         ear wax sesation a loss of hearing  Physical Exam  General:  Well-developed,well-nourished,in no acute distress; alert,appropriate and cooperative throughout examination Head:  Normocephalic and atraumatic without obvious abnormalities. No apparent alopecia or balding. Eyes:  pupils equal and pupils round.   Ears:  R Canal drainage and R cerumen impaction.   Nose:  mucosal edema and R frontal sinus tenderness.   Mouth:  posterior lymphoid hypertrophy and postnasal drip.   Neck:  No deformities, masses, or tenderness noted. Lungs:   clear anteriorno wheezes.   Heart:  Regular rate and rhythm; no murmurs, rubs,  or bruits. Abdomen:  soft, non-tender, and normal bowel sounds.   Msk:  No deformity or scoliosis noted of thoracic or lumbar spine.   Pulses:  R and L carotid,radial,femoral,dorsalis pedis and posterior tibial pulses are full and equal bilaterally Extremities:  trace left pedal edema and trace right pedal edema.     Impression & Recommendations:  Problem # 1:  HYPERLIPIDEMIA (ICD-272.4)  The following medications were removed from the medication list:  Trilipix 135 Mg Cpdr (Choline fenofibrate) ..... One by mouth at bed time Her updated medication list for this problem includes:    Trilipix 135 Mg Cpdr (Choline fenofibrate) ..... One by mouth-not taking  Labs Reviewed: SGOT: 28 (09/04/2008)   SGPT: 23 (09/04/2008)  Lipid Goals: Chol Goal: 200 (02/13/2008)   HDL Goal: 40 (02/13/2008)   LDL Goal: 130 (02/13/2008)   TG Goal: 150 (02/13/2008)  10 Yr Risk Heart Disease: 17 % Prior 10 Yr Risk Heart Disease: 13 % (10/06/2008)   HDL:37.6 (09/04/2008), 38.9 (05/29/2008)  LDL:114 (09/04/2008), DEL (05/29/2008)  Chol:176 (09/04/2008), 217 (05/29/2008)  Trig:123 (09/04/2008), 122 (05/29/2008)  Problem # 2:  HYPERTENSION (ICD-401.9)  Her updated medication list for this problem includes:    Tenoretic 50 50-25 Mg Tabs (Atenolol-chlorthalidone) .Marland Kitchen... 1/2 once daily in the morning    Lotrel 5-20 Mg Caps (Amlodipine besy-benazepril hcl) ..... One by mouth daily  BP today: 140/80 Prior BP: 130/80 (01/26/2009)  10 Yr Risk Heart Disease: 17 % Prior 10 Yr Risk Heart Disease: 13 % (10/06/2008)  Labs Reviewed: K+: 4.0 (09/04/2008) Creat: : 1.1 (09/04/2008)   Chol: 176 (09/04/2008)   HDL: 37.6 (09/04/2008)   LDL: 114 (09/04/2008)   TG: 123 (09/04/2008)  Problem # 3:  DYSURIA, CHRONIC (ICD-788.1)  treat if positive Orders: UA Dipstick w/o Micro (automated)  (81003)  Her updated medication list for this problem  includes:    Ciprofloxacin Hcl 250 Mg Tabs (Ciprofloxacin hcl) ..... One by mouth bid  Problem # 4:  CERUMEN IMPACTION (ICD-380.4) informed conset obtained, using a cerumin spoon the wax impaction was dislodged and the canal was lavaged with 1/2 peroxide and 1/2 warm water solution until clear  Orders: Cerumen Impaction Removal (16109)  Complete Medication List: 1)  Tenoretic 50 50-25 Mg Tabs (Atenolol-chlorthalidone) .... 1/2 once daily in the morning 2)  Adult Aspirin Ec Low Strength 81 Mg Tbec (Aspirin) .... Once daily 3)  Pepcid Ac 10 Mg Tabs (Famotidine) .... Take 1 tablet by mouth once a day 4)  Lotrel 5-20 Mg Caps (Amlodipine besy-benazepril hcl) .... One by mouth daily 5)  Trilipix 135 Mg Cpdr (Choline fenofibrate) .... One by mouth-not taking 6)  Ciprofloxacin Hcl 250 Mg Tabs (Ciprofloxacin hcl) .... One by mouth bid  Hypertension Assessment/Plan:      The patient's hypertensive risk group is category B: At least one risk factor (excluding diabetes) with no target organ damage.  Her calculated 10 year risk of coronary heart disease is 17 %.  Today's blood pressure is 140/80.  Her blood pressure goal is < 140/90.   Patient Instructions: 1)  Please schedule a follow-up appointment in 2 months. Prescriptions: CIPROFLOXACIN HCL 250 MG TABS (CIPROFLOXACIN HCL) one by mouth BID  #10 x 1   Entered and Authorized by:   [name] MD   Signed by:   [name] MD on 03/09/2009   Method used:   Electronically to        [facility] (retail)       [address]       [place], [place]  [address]       Ph: [phone]       Fax: [phone]   RxID:   [id number]   Appended Document: 2 MONTH ROV/NJR  Laboratory Results   Urine Tests    Routine Urinalysis   Color: yellow Appearance: Clear Glucose: negative   (Normal Range: Negative) Bilirubin:  negative   (Normal Range: Negative) Ketone: trace (5)   (Normal Range: Negative) Spec.  Gravity: 1.020   (Normal Range: 1.003-1.035) Blood: negative   (Normal Range: Negative) pH: 5.0   (Normal Range: 5.0-8.0) Protein: negative   (Normal Range: Negative) Urobilinogen: 0.2   (Normal Range: 0-1) Nitrite: negative   (Normal Range: Negative) Leukocyte Esterace: trace   (Normal Range: Negative)    Comments: Rita Ohara  Mar 09, 2009 2:52 PM

## 2010-11-29 NOTE — Progress Notes (Signed)
Summary: knee pain  Phone Note Call from Patient Call back at Home Phone [phone]   Caller: husband,shelley Summary of Call: Wife suffering with right knee last 3-4 days.  Feels like the bone, kneecap,  is out. Some bruising on the outside of the leg.  No fall.   Using heat. Dr. Abner Greenspan appointment 9am.  Raelene Bott Spell, RN  July 18, 2010 8:23 AM    Initial call taken by: Rudy Jew, RN,  July 18, 2010 8:20 AM

## 2010-11-29 NOTE — Assessment & Plan Note (Signed)
Summary: ROA/F/U/RCD [facility] 2 MONTH ROV/NJR rsc per bmp/njr   Vital Signs:  Patient Profile:   70 Years Old Female Height:     70 inches Weight:      256 pounds Temp:     98.6 degrees F oral Pulse rate:   76 / minute Resp:     14 per minute BP sitting:   132 / 80  (left arm) Cuff size:   large  Vitals Entered By: Willy Eddy, LPN (June 05, 2008 10:19 AM)                 Chief Complaint:  roa labs.  Lipid Management History:      Positive NCEP/ATP III risk factors include female age 60 years old or older, HDL cholesterol less than 40, and hypertension.  Negative NCEP/ATP III risk factors include non-tobacco-user status, no ASHD (atherosclerotic heart disease), no prior stroke/TIA, no peripheral vascular disease, and no history of aortic aneurysm.        The patient states that she knows about the "Therapeutic Lifestyle Change" diet.  Her compliance with the TLC diet is poor.  The patient expresses understanding of adjunctive measures for cholesterol lowering.  Adjunctive measures started by the patient include limit alcohol consumpton and weight reduction.  She notes side effects from her lipid-lowering medication.  Comments include: itching.  The patient denies any symptoms to suggest myopathy or liver disease from her "statin" therapy.       Current Allergies: No known allergies   Past Medical History:    Reviewed history from 09/09/2007 and no changes required:       Hyperlipidemia       Hypertension       Obesity       Diverticulitis, hx of       Diverticulosis, colon  Past Surgical History:    Reviewed history from 01/16/2008 and no changes required:       S/P skin graft for trauma       Hysterectomy       Bladder tack       Colonoscopy-2001, 2005       Oophorectomy       ureter repair  for transected left ureter       temporary stent       Colectomy for sigmoid diverticulosis   Family History:    Reviewed history from 06/03/2007 and no changes  required:       Family History Other cancer-Breast, Prostate  Social History:    Reviewed history from 06/03/2007 and no changes required:       Never Smoked       Alcohol use-no       Drug use-no       Married    Review of Systems       briusing   Physical Exam  General:     overweight-appearing.  118/72 Head:     Normocephalic and atraumatic without obvious abnormalities. No apparent alopecia or balding. Eyes:     No corneal or conjunctival inflammation noted. EOMI. Perrla. Funduscopic exam benign, without hemorrhages, exudates or papilledema. Vision grossly normal. Mouth:     Oral mucosa and oropharynx without lesions or exudates.  Teeth in good repair. Neck:     No deformities, masses, or tenderness noted. Lungs:     Normal respiratory effort, chest expands symmetrically. Lungs are clear to auscultation, no crackles or wheezes. Heart:     Normal rate and regular rhythm. S1 and  S2 normal without gallop, murmur, click, rub or other extra sounds. rate 62 to 64 Abdomen:     Bowel sounds positive,abdomen soft and non-tender without masses, organomegaly or hernias noted. Msk:     no joint tenderness, no joint swelling, and no joint warmth.   Pulses:     R and L carotid,radial,femoral,dorsalis pedis and posterior tibial pulses are full and equal bilaterally Extremities:     No clubbing, cyanosis, edema, or deformity noted with normal full range of motion of all joints.      Impression & Recommendations:  Problem # 1:  HYPERTENSION (ICD-401.9)  Her updated medication list for this problem includes:    Lotrel 5-10 Mg Caps (Amlodipine besy-benazepril hcl) ..... One by mouth at bed time    Tenoretic 50 50-25 Mg Tabs (Atenolol-chlorthalidone) .Marland Kitchen... 1/2 once daily in the morning  BP today: 132/80 Prior BP: 120/80 (04/03/2008)  Prior 10 Yr Risk Heart Disease: Not enough information (10/02/2007)  Labs Reviewed: Creat: 1.1 (03/10/2008) Chol: 217 (05/29/2008)   HDL: 38.9  (05/29/2008)   LDL: DEL (05/29/2008)   TG: 122 (05/29/2008)   Problem # 2:  HYPERLIPIDEMIA (ICD-272.4) iching was better off the crestor.... Her updated medication list for this problem includes:    Vytorin 10-40 Mg Tabs (Ezetimibe-simvastatin) .Marland Kitchen... 1/2 at bed time  Labs Reviewed: Chol: 217 (05/29/2008)   HDL: 38.9 (05/29/2008)   LDL: DEL (05/29/2008)   TG: 122 (05/29/2008) SGOT: 28 (05/29/2008)   SGPT: 21 (05/29/2008)  Lipid Goals: Chol Goal: 200 (02/13/2008)   HDL Goal: 40 (02/13/2008)   LDL Goal: 130 (02/13/2008)   TG Goal: 150 (02/13/2008)  Prior 10 Yr Risk Heart Disease: Not enough information (10/02/2007)   Complete Medication List: 1)  Lotrel 5-10 Mg Caps (Amlodipine besy-benazepril hcl) .... One by mouth at bed time 2)  Tenoretic 50 50-25 Mg Tabs (Atenolol-chlorthalidone) .... 1/2 once daily in the morning 3)  Vytorin 10-40 Mg Tabs (Ezetimibe-simvastatin) .... 1/2 at bed time 4)  Adult Aspirin Ec Low Strength 81 Mg Tbec (Aspirin) .... Once daily 5)  Maxichlor Pse Dm 60-4-20 Mg Tabs (Pseudoeph-chlorphen-dm) .... One by mouth every 6 bhours as needed 6)  Vitamin C 500 Mg Tabs (Ascorbic acid)  Lipid Assessment/Plan:      Based on NCEP/ATP III, the patient's risk factor category is "2 or more risk factors and a calculated 10 year CAD risk of < 20%".  From this information, the patient's calculated lipid goals are as follows: Total cholesterol goal is 200; LDL cholesterol goal is 130; HDL cholesterol goal is 40; Triglyceride goal is 150.     Patient Instructions: 1)  Please schedule a follow-up appointment in 3 months. 2)  Hepatic Panel prior to visit, ICD-9:995.20 3)  Lipid Panel prior to visit, ICD-9:272.4 4)  BMP prior to visit, ICD-9:401.20   ]

## 2010-11-29 NOTE — Progress Notes (Signed)
Summary: RX CALLED IN   Phone Note Call from Patient Call back at Home Phone [phone]   Caller: Patient Call For: [name] Reason for Call: Talk to Nurse Summary of Call: NEED REGLAN CALLED IN TO [facility] [address]  Initial call taken by: Tawni Levy,  August 05, 2008 9:37 AM  Follow-up for Phone Call        08-05-08 [facility] [address] and they have the pt's prescription of the 2 Reglan tablets.  I called pt to advise her.  Pt also called to reschedule the colonoscopy that was to be done on 08-06-08 @ 11:30AM.  She has an upper respiratory infection and has rescheduled to 08-13-08 @ 3:30Pm.  I reviewed her prep instructions with her regarding the times.  Lowry Ram CMA

## 2010-11-29 NOTE — Assessment & Plan Note (Signed)
Summary: WEAK, GENERAL MALAISE // RS   Vital Signs:  Patient profile:   70 year old female Height:      70 inches (177.80 cm) Weight:      274.31 pounds (124.69 kg) O2 Sat:      98 % on Room air Temp:     98.4 degrees F (36.89 degrees C) oral Pulse rate:   74 / minute BP sitting:   142 / 86  (left arm) Cuff size:   large  Vitals Entered By: Josph Macho RMA (July 01, 2010 11:36 AM)  O2 Flow:  Room air CC: Weak X3-4 days/ CF Is Patient Diabetic? No   History of Present Illness: Patient in today wit multiple complaints. On Tuesday she felt the sudden onset of fatigue 3 days ago. 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 Sleeping poorly, stuggling with malaise, urinary frequency, urgency, suprapubic pain and occasional incontinence> She also notes some intermittent nausea, no vomitting. Some epigastric and LUQ pain. She hots some low grade chills and hot sweats. She has not been checking her sugars frequently but she has noted they have been up lately, was 196 earlier.  She has some occasional chest discomfort but no palpitations/diaphoresis associated. Long history of abdominal surgeriess, had 7 inches of her colon removed in past and during that her urethra was nicked and required reconstruction  Allergies: 1)  ! * Crestor  Past History:  Past medical history reviewed for relevance to current acute and chronic problems. Social history (including risk factors) reviewed for relevance to current acute and chronic problems.  Past Medical History: Reviewed history from 11/15/2009 and no changes required. Hyperlipidemia Hypertension Obesity Diverticulitis, hx of Diverticulosis, colon abdominal pain unspecified constipation colon adenoma 2009  Social History: Reviewed history from 06/30/2008 and no changes required. Never Smoked Alcohol use-no Drug use-no Married Patient does not get regular exercise.   Review of  Systems      See HPI  Physical Exam  General:  Well-developed,well-nourished,in no acute distress; alert,appropriate and cooperative throughout examination Head:  Normocephalic and atraumatic without obvious abnormalities. No apparent alopecia or balding. Eyes:  No corneal or conjunctival inflammation noted. EOMI. Perrla. Funduscopic exam benign, without hemorrhages, exudates or papilledema. Vision grossly normal. Mouth:  Oral mucosa and oropharynx without lesions or exudates.  Teeth in good repair. Neck:  No deformities, masses, or tenderness noted. Lungs:  Normal respiratory effort, chest expands symmetrically. Lungs are clear to auscultation, no crackles or wheezes. Heart:  Normal rate and regular rhythm. S1 and S2 normal without gallop, click, rub or other extra sounds.grade  1/6 systolic murmur.   Abdomen:  Bowel sounds positive,abdomen soft and non-tender without masses, organomegaly or hernias noted. Extremities:  No clubbing, cyanosis, edema, or deformity noted  Cervical Nodes:  No lymphadenopathy noted Psych:  Cognition and judgment appear intact. Alert and cooperative with normal attention span and concentration. No apparent delusions, illusions, hallucinations   Impression & Recommendations:  Problem # 1:  FATIGUE (ICD-780.79)  Orders: Venipuncture (16109) Specimen Handling (60454) TLB-Renal Function Panel (80069-RENAL) TLB-CBC Platelet - w/Differential (85025-CBCD) TLB-Hepatic/Liver Function Pnl (80076-HEPATIC) TLB-TSH (Thyroid Stimulating Hormone) (84443-TSH) needs 7-8 hours of sleep nightly. Decrease carb intake, cont insulin seek immediate medical care if symptoms worsen  Problem # 2:  CHEST PAIN, ATYPICAL (ICD-786.59)  Orders: Cardiology Referral (Cardiology) Likely GI related but with multiple reisk factors for heart disease. Take ECASA 81 mg once daily, NTG as needed  and referred to Cardiology for further evaluation  Problem # 3:  EPIGASTRIC PAIN  (ICD-789.06)  Orders: TLB-H. Pylori Abs(Helicobacter Pylori) (86677-HELICO) Take Zantac two times a day and avoid offending foods  Problem # 4:  SUPRAPUBIC PAIN (ICD-789.09)  Her updated medication list for this problem includes:    Adult Aspirin Ec Low Strength 81 Mg Tbec (Aspirin) ..... Once daily    Aleve 220 Mg Tabs (Naproxen sodium) .Marland Kitchen..Marland Kitchen Two by mouth two times a day  Orders: T-Urine Culture (Spectrum Order) [id number]) Venipuncture (44034) Specimen Handling (74259) Maintain adequate hydration  Complete Medication List: 1)  Tenoretic 50 50-25 Mg Tabs (Atenolol-chlorthalidone) .... 1/2 once daily in the morning 2)  Adult Aspirin Ec Low Strength 81 Mg Tbec (Aspirin) .... Once daily 3)  Lotrel 5-20 Mg Caps (Amlodipine besy-benazepril hcl) .... One by mouth daily 4)  Prevacid 15 Mg Cpdr (Lansoprazole) .... One by mouth once daily as needed 5)  Crestor 20 Mg Tabs (Rosuvastatin calcium) .... One by mouth mwf 6)  Vitamin D 1000 Unit Tabs (Cholecalciferol) .... One tablet by mouth once daily 7)  Aleve 220 Mg Tabs (Naproxen sodium) .... Two by mouth two times a day 8)  Zantac 150 Mg Tabs (Ranitidine hcl) .Marland Kitchen.. 1 tab by mouth two times a day for heartburn 9)  Nitrostat 0.4 Mg Subl (Nitroglycerin) .Marland Kitchen.. 1 tab sl as needed cp may repeat q 5 min x 3 doses if no relief to er 10)  Cipro 500 Mg Tabs (Ciprofloxacin hcl) .Marland Kitchen.. 1 tab by mouth two times a day x  5 days  Other Orders: TLB-A1C / Hgb A1C (Glycohemoglobin) (83036-A1C)  Patient Instructions: 1)  Please schedule a follow-up appointment as needed .  2)  Drink plenty of fluids up to 3-4 quarts a day. Cranberry juice is especially recommended in addition to large amounts of water. Avoid caffeine & carbonated drinks, they tend to irritate the bladder, Return in 3-5 days if you're not better: sooner if you're feeling worse.  3)  Take your antibiotic as prescribed until ALL of it is gone, but stop if you develop a rash or swelling and  contact our office as soon as possible.  4)  Benefiber powder 2 tsp by mouth three times a day in 0 oz of fluids daily 5)  Increase soluble fiber in diet, need 64 oz of clear fluids daily 6)  Consider soy products daily 7)  Cont 81 mg Aspirin dialy 8)  Take a yogurt or a probiotic cap daily ie Activia or Align Prescriptions: CIPRO 500 MG TABS (CIPROFLOXACIN HCL) 1 tab by mouth two times a day x  5 days  #10 x 0   Entered and Authorized by:   Danise Edge MD   Signed by:   Danise Edge MD on 07/01/2010   Method used:   Electronically to        [facility] (retail)       [address]       [place], [place]  [address]       Ph: [phone]       Fax: [phone]   RxID:   [id number] NITROSTAT 0.4 MG SUBL (NITROGLYCERIN) 1 tab sl as needed CP may repeat q 5 min x 3 doses if no relief to ER  #25 x 1   Entered and Authorized by:   Danise Edge MD   Signed by:   Danise Edge MD on 07/01/2010  Method used:   [facility]ron[facility] to        [facility]       [address]       [place], [place]  [address]       Ph: [phone]       Fax: [phone]   RxID:   [id number] ZANTAC 150 MG TABS (RANITIDINE HCL) 1 tab by mouth two times a day for heartburn  #60 x 2   Entered and Authorized by:   Danise Edge MD   Signed by:   Danise Edge MD on 07/01/2010   Method used:   Electronically to        [facility] (retail)       [address]       [place], [place]  [address]       Ph: [phone]       Fax: [phone]   RxID:   [id number]   Laboratory Results   Urine Tests    Routine Urinalysis   Color: yellow Appearance: Clear Glucose: negative   (Normal Range: Negative) Bilirubin: negative   (Normal Range: Negative) Ketone: negative   (Normal Range: Negative) Spec. Gravity: 1.010   (Normal Range: 1.003-1.035) Blood: negative   (Normal Range: Negative) pH: 5.0   (Normal  Range: 5.0-8.0) Protein: negative   (Normal Range: Negative) Urobilinogen: 0.2   (Normal Range: 0-1) Nitrite: negative   (Normal Range: Negative) Leukocyte Esterace: moderate   (Normal Range: Negative)

## 2010-11-29 NOTE — Progress Notes (Signed)
Summary: UA RECHECK  Phone Note Call from Patient Call back at Work Phone [phone]   Caller: Patient Call For: [name] MD Summary of Call: pt is on another abx for urine inf she will be finish with abx this weekend, should she be have another ua check Initial call taken by: Heron Sabins,  July 13, 2010 12:32 PM  Follow-up for Phone Call        why doesn't she give [place] a urine sample on friday am Follow-up by: Danise Edge MD,  July 13, 2010 2:14 PM  Additional Follow-up for Phone Call Additional follow up Details #1::        Patient informed Additional Follow-up by: Josph Macho RMA,  July 13, 2010 2:21 PM

## 2010-11-29 NOTE — Miscellaneous (Signed)
Summary: Appointment No Show  Appointment status changed to no show by [employer] on 08/03/2010 3:25 PM.  No Show Comments ---------------- ECHO 786.59/[facility]/SL  Appointment Information ----------------------- Appt Type:  CARDIOLOGY ANCILLARY VISIT      Date:  Wednesday, August 03, 2010      Time:  11:30 AM for 60 min   Urgency:  Routine   Made By:  Pearson Grippe  To Visit:  [id number]    Reason:  ECHO 786.59/[facility]/SL  Appt Comments ------------- -- 08/03/10 15:25: (CEMR) NO SHOW -- ECHO 786.59/[facility]/SL -- 08/02/10 12:30: (CEMR) BOOKED -- Routine CARDIOLOGY ANCILLARY VISIT at 08/03/2010 11:30 AM for 60 min ECHO 786.59/[facility]/SL -- 07/26/10 16:56: (CEMR) BOOKE

## 2010-11-29 NOTE — Miscellaneous (Signed)
Summary: PT note  PT note   Imported By: Kassie Mends 03/30/2008 14:36:15  _____________________________________________________________________  External Attachment:    Type:   Image     Comment:   PT note

## 2010-11-29 NOTE — Assessment & Plan Note (Signed)
Summary: 3 month rov/njr---PT [name] // RS   Vital Signs:  Patient profile:   70 year old female Height:      70 inches Weight:      272 pounds BMI:     39.17 Temp:     98.2 degrees F oral Pulse rate:   80 / minute Resp:     14 per minute BP sitting:   130 / 80  (left arm) Cuff size:   large  Vitals Entered By: Willy Eddy, LPN (May 12, 2010 10:12 AM) CC: roa-saw dr stafford for gout- taking husbands indocin--how long to take??, Hypertension Management, Lipid Management, Headache   Primary Care Provider:  [name], MD   CC:  roa-saw dr Scotty Court for gout- taking husbands indocin--how long to take??, Hypertension Management, Lipid Management, and Headache.  History of Present Illness: had a knee injury in the distant past has deep pain in the right knee with slightly positive shelf test and some swelling the pt had a working diagnosis of gout she has been taking indocin with some relief  Hypertension History:      She denies headache, chest pain, palpitations, dyspnea with exertion, orthopnea, PND, peripheral edema, visual symptoms, neurologic problems, syncope, and side effects from treatment.        Positive major cardiovascular risk factors include female age 20 years old or older, hyperlipidemia, and hypertension.  Negative major cardiovascular risk factors include non-tobacco-user status.        Further assessment for target organ damage reveals no history of ASHD, stroke/TIA, or peripheral vascular disease.    Lipid Management History:      Positive NCEP/ATP III risk factors include female age 49 years old or older and hypertension.  Negative NCEP/ATP III risk factors include non-tobacco-user status, no ASHD (atherosclerotic heart disease), no prior stroke/TIA, no peripheral vascular disease, and no history of aortic aneurysm.        The patient states that she knows about the "Therapeutic Lifestyle Change" diet.  Her compliance with the TLC diet is poor.       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) 10)  Diverticulosis, Colon  (ICD-562.10) 11)  Diverticulitis, Hx of  (ICD-V12.79) 12)  Gout, Unspecified  (ICD-274.9) 13)  Hypertension  (ICD-401.9) 14)  Hyperlipidemia  (ICD-272.4)  Current Problems (verified): 1)  Cellulitis and Abscess of Other Specified Site  (ICD-682.8) 2)  Lump or Mass in Breast  (ICD-611.72) 3)  Obesity  (ICD-278.00) 4)  Back Pain, Acute  (ICD-724.5) 5)  Dysuria, Chronic  (ICD-788.1) 6)  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 1/2 Once Daily in The Morning 2)  Adult Aspirin Ec Low Strength 81 Mg  Tbec (Aspirin) .... Once Daily 3)  Lotrel 5-20 Mg Caps (Amlodipine Besy-Benazepril Hcl) .... One By Mouth Daily 4)  Prevacid 15 Mg Cpdr (Lansoprazole) .... One By Mouth Once Daily As Needed 5)  Livalo 4 Mg Tabs (Pitavastatin Calcium) .Marland Kitchen.. 1 One Monday , Wednesday and Friday 6)  Vitamin D 1000 Unit  Tabs (Cholecalciferol) .... One Tablet By Mouth Once Daily 7)  Mupirocin 2 % Oint (Mupirocin) .... Apply To  Site Two Times A Day  Current Medications (verified): 1)  Tenoretic 50 50-25 Mg  Tabs (Atenolol-Chlorthalidone) .... 1/2 Once Daily in The Morning 2)  Adult Aspirin Ec Low Strength 81 Mg  Tbec (Aspirin) .... Once Daily 3)  Lotrel 5-20 Mg Caps (Amlodipine Besy-Benazepril Hcl) .... One By Mouth Daily 4)  Prevacid 15 Mg Cpdr (Lansoprazole) .... One By Mouth Once Daily As Needed 5)  Livalo 4 Mg Tabs (Pitavastatin Calcium) .Marland Kitchen.. 1 One Monday , Wednesday and Friday 6)  Vitamin D 1000 Unit  Tabs (Cholecalciferol) .... One Tablet By Mouth Once Daily 7)  Mupirocin 2 % Oint (Mupirocin) .... Apply To Site Two Times A Day 8)  Indocin 50 Mg Supp (Indomethacin) .Marland Kitchen.. 1 Three Times A Day As Needed  Allergies (verified): 1)  ! * Crestor  Past History:  Family History: Last updated: 11/15/2009 Family History of Breast Cancer:  Mother  Family History of Prostate Cancer: Father No FH of Colon Cancer:  Social History: Last updated: 06/30/2008 Never Smoked Alcohol use-no Drug use-no Married Patient does not get regular exercise.   Risk Factors: Diet: eating too much (11/15/2009) Exercise: no (06/30/2008)  Risk Factors: Smoking Status: never (05/12/2010)  Past medical, surgical, family and social histories (including risk factors) reviewed, and no changes noted (except as noted below).  Past Medical History: Reviewed history from 11/15/2009 and no changes required. Hyperlipidemia Hypertension Obesity Diverticulitis, hx of Diverticulosis, colon abdominal pain unspecified constipation colon adenoma 2009  Past Surgical History: Reviewed history from 11/15/2009 and no changes required. 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 Family History of Breast Cancer:   Mother  Family History of Prostate Cancer: Father No FH of Colon Cancer:  Social History: Reviewed history from 06/30/2008 and no changes required. Never Smoked Alcohol use-no Drug use-no Married Patient does not get regular exercise.   Review of Systems  The patient denies anorexia, fever, weight loss, weight gain, vision loss, decreased hearing, hoarseness, chest pain, syncope, dyspnea on exertion, peripheral edema, prolonged cough, headaches, hemoptysis, abdominal pain, melena, hematochezia, severe indigestion/heartburn, hematuria, incontinence, genital sores, muscle weakness, suspicious skin lesions, transient blindness, difficulty walking, depression, unusual weight change, abnormal bleeding, enlarged lymph nodes, angioedema, and breast masses.    Physical Exam  General:  alert and overweight-appearing.   Head:  normocephalic and no abnormalities palpated.   Eyes:  pupils equal and pupils reactive to light.   Ears:  R ear normal and L ear normal.   Nose:  no external deformity and no nasal discharge.   Mouth:  pharynx pink and moist and no erythema.   Neck:  No deformities, masses, or tenderness noted. Lungs:  normal respiratory effort and no wheezes.   Heart:  normal rate and regular rhythm.   Abdomen:  soft,  no guarding, and epigastric tenderness.   Msk:  joint tenderness and joint swelling.   Extremities:  trace left pedal edema and trace right pedal edema.   Neurologic:  alert & oriented X3, gait normal, and DTRs symmetrical and normal.     Impression & Recommendations:  Problem # 1:  OBESITY (ICD-278.00)  Ht: 70 (05/12/2010)   Wt: 272 (05/12/2010)   BMI: 39.17 (05/12/2010)  Problem # 2:  HYPERLIPIDEMIA (ICD-272.4) Assessment: Unchanged  change to crestor 20 mg MWF Her updated medication list for this problem includes:    Crestor 20 Mg Tabs (Rosuvastatin calcium) ..... 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 After care discussed. Pt tolerated procedure well. right knee Her updated medication list for this problem includes:    Adult Aspirin Ec Low Strength 81 Mg Tbec (Aspirin) ..... Once daily    Aleve 220 Mg Tabs (Naproxen sodium) .Marland Kitchen..Marland Kitchen Two by mouth two times a day  Orders: Joint Aspirate / Injection, Large (20610) Depo- Medrol 40mg  (J1030) T-Knee Comp Right 4 Views (16109UE)  Discussed use of medications, application of heat or cold, and exercises.   Problem # 4:  GOUT, UNSPECIFIED (ICD-274.9)  Elevate extremity; warm compresses, symptomatic relief and medication as directed.   Complete Medication List: 1)  Tenoretic 50 50-25 Mg Tabs (Atenolol-chlorthalidone) .... 1/2 once daily in the morning 2)  Adult Aspirin Ec Low Strength 81 Mg Tbec (Aspirin) .... Once daily 3)  Lotrel 5-20 Mg Caps (Amlodipine besy-benazepril hcl) .... One by mouth daily 4)  Prevacid 15 Mg Cpdr (Lansoprazole) .... One by mouth once daily as needed 5)  Crestor 20 Mg Tabs (Rosuvastatin calcium) .... One by mouth mwf 6)  Vitamin D 1000 Unit Tabs (Cholecalciferol) .... One tablet by mouth once daily 7)  Aleve 220 Mg Tabs (Naproxen sodium) .... Two by mouth two times a day  Hypertension Assessment/Plan:      The patient's hypertensive risk group is category B: At least one risk factor (excluding diabetes) with  no target organ damage.  Her calculated 10 year risk of coronary heart disease is 13 %.  Today's blood pressure is 130/80.  Her blood pressure goal is < 140/90.  Lipid Assessment/Plan:      Based on NCEP/ATP III, the patient's risk factor category is "2 or more risk factors and a calculated 10 year CAD risk of < 20%".  The patient's lipid goals are as follows: Total cholesterol goal is 200; LDL cholesterol goal is 130; HDL cholesterol goal is 40; Triglyceride goal is 150.  Her LDL cholesterol goal has been met.     Patient Instructions: 1)  Please schedule a follow-up appointment in 3 months.

## 2010-11-29 NOTE — Progress Notes (Signed)
Summary: URI  Phone Note Call from Patient   Caller: Patient Call For: Dr. Lovell Sheehan Reason for Call: Lab or Test Results Summary of Call: Missed her appt Monday, and has URI, productive cough, chills and wants generic meds if possible. [name]) [phone] Initial call taken by: Lynann Beaver CMA,  February 12, 2008 8:17 AM  Follow-up for Phone Call        must have ov if she wants anitibioitc--may h ave atuss ds 6 oz q 12 hrs as needed-per dr Lovell Sheehan Follow-up by: Willy Eddy, LPN,  February 12, 2008 8:47 AM  Additional Follow-up for Phone Call Additional follow up Details #1::        talked with pt and she states she isnt couighing much-wanted appointment-given for tomorrow Additional Follow-up by: Willy Eddy, LPN,  February 12, 2008 8:52 AM

## 2010-11-30 ENCOUNTER — Telehealth: Payer: Self-pay | Admitting: *Deleted

## 2010-11-30 NOTE — Telephone Encounter (Signed)
Pt states the pharmacy changed her medications.  She no longer is getting Tenoretic.  Wants her to take Metoprolol.  Is this what Dr. Lovell Sheehan requested?

## 2010-11-30 NOTE — Telephone Encounter (Signed)
Changed because tenorectic on back order is what the messages say.  Is it ok?

## 2010-11-30 NOTE — Telephone Encounter (Signed)
Bonnye told pt it is fine.

## 2010-11-30 NOTE — Telephone Encounter (Signed)
i believe she is still on tenoretic check in centricity for confirmation

## 2010-12-01 NOTE — Assessment & Plan Note (Signed)
Summary: 3 MNTH ROV//SLM   Vital Signs:  Patient profile:   70 year old female Height:      70 inches Weight:      282 pounds BMI:     40.61 Temp:     97.9 degrees F oral Pulse rate:   76 / minute Resp:     16 per minute BP sitting:   140 / 82  (left arm) Cuff size:   large  Vitals Entered By: Willy Eddy, LPN (November 09, 2010 10:42 AM) CC: roa, Hypertension Management, Lipid Management Is Patient Diabetic? No   Primary Care Provider:  [name], MD   CC:  roa, Hypertension Management, and Lipid Management.  History of Present Illness: The knee pain has improved  and she did not go to the orthopedist She has pain in the hip on her same side. Left side. She is concerned that she has arthritis vx degeneration The pt has increased pain   Hypertension History:      She denies headache, chest pain, palpitations, dyspnea with exertion, orthopnea, PND, peripheral edema, visual symptoms, neurologic problems, syncope, and side effects from treatment.        Positive major cardiovascular risk factors include female age 53 years old or older, hyperlipidemia, and hypertension.  Negative major cardiovascular risk factors include non-tobacco-user status.        Further assessment for target organ damage reveals no history of ASHD, stroke/TIA, or peripheral vascular disease.    Lipid Management History:      Positive NCEP/ATP III risk factors include female age 85 years old or older, HDL cholesterol less than 40, and hypertension.  Negative NCEP/ATP III risk factors include non-tobacco-user status, no ASHD (atherosclerotic heart disease), no prior stroke/TIA, no peripheral vascular disease, and no history of aortic aneurysm.        The patient states that she knows about the "Therapeutic Lifestyle Change" diet.  Her compliance with the TLC diet is poor.      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) 7)  Chest Pain, Atypical  (ICD-786.59) 8)  Fatigue  (ICD-780.79) 9)  Epigastric Pain  (ICD-789.06) 10)  Hyperglycemia  (ICD-790.29) 11)  Suprapubic Pain  (ICD-789.09) 12)  Cellulitis and Abscess of Other Specified Site  (ICD-682.8) 13)  Lump or Mass in Breast  (ICD-611.72) 14)  Obesity  (ICD-278.00) 15)  Back Pain, Acute  (ICD-724.5) 16)  Dysuria, Chronic  (ICD-788.1) 17)  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36) 6)  Chest Pain, Atypical  (ICD-786.59) 7)  Fatigue  (ICD-780.79) 8)  Epigastric Pain  (ICD-789.06) 9)  Hyperglycemia  (ICD-790.29) 10)  Suprapubic Pain  (ICD-789.09) 11)  Cellulitis and Abscess of Other Specified Site  (ICD-682.8) 12)  Lump or Mass in Breast  (ICD-611.72) 13)  Obesity  (ICD-278.00) 14)  Back Pain, Acute  (ICD-724.5) 15)  Dysuria, Chronic  (ICD-788.1) 16)  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 1/2 Once Daily in The Morning 2)  Adult Aspirin Ec Low Strength 81 Mg  Tbec (Aspirin) .... Once Daily 3)  Lotrel 5-20 Mg Caps (Amlodipine Besy-Benazepril Hcl) .... One By Mouth Daily 4)  Crestor 20 Mg Tabs (Rosuvastatin Calcium) .... One By Mouth Mwf 5)  Vitamin D 1000 Unit  Tabs (Cholecalciferol) .... One Tablet By Mouth Once Daily 6)  Nitrostat 0.4 Mg Subl (Nitroglycerin) .Marland Kitchen.. 1 Tab Sl As Needed Cp May Repeat Q 5 Min X 3 Doses If No Relief To Er 7)  Allopurinol 100 Mg Tabs (Allopurinol) .... By Mouth Daily 8)  Diclofenac Sodium 50 Mg Tbec (Diclofenac Sodium) .... One By Mouth Two Times A Day As Needed Knee Pain (Take With Food) 9)  Wrist Splint  Misc (Elastic Bandages & Supports) .... Wear At Night 10)  [employer]  Misc (Misc. Devices)  Current Medications (verified): 1)  Tenoretic 50 50-25 Mg  Tabs (Atenolol-Chlorthalidone) .... 1/2 Once Daily in The Morning 2)  Adult Aspirin Ec Low Strength 81 Mg  Tbec (Aspirin) .... Once Daily 3)  Lotrel 5-20 Mg Caps (Amlodipine Besy-Benazepril Hcl) .... One By Mouth Daily 4)  Crestor 20 Mg Tabs (Rosuvastatin Calcium) .... One By Mouth Mwf 5)  Vitamin D 1000 Unit  Tabs (Cholecalciferol) .... One Tablet By Mouth Once Daily 6)  Nitrostat 0.4 Mg Subl (Nitroglycerin) .Marland Kitchen.. 1 Tab Sl As Needed Cp May Repeat Q 5 Min X 3 Doses If No Relief To Er 7)  Allopurinol 100 Mg Tabs (Allopurinol) .... By Mouth Daily 8)  Diclofenac Sodium 50 Mg Tbec (Diclofenac Sodium) .... One By Mouth Two Times A Day As Needed Knee Pain (Take With Food)  Allergies (verified): 1)  ! * Crestor  Past History:  Family History: Last updated:  11/15/2009 Family History of Breast Cancer:  Mother  Family History of Prostate Cancer: Father No FH of Colon Cancer:  Social History: Last updated: 06/30/2008 Never Smoked Alcohol use-no Drug use-no Married Patient does not get regular exercise.   Risk Factors: Diet: eating too much (10/14/2010) Exercise: no (10/14/2010)  Risk Factors: Smoking Status: never (11/09/2010)  Past medical, surgical, family and social histories (including risk factors) reviewed, and no changes noted (except as noted below).  Past Medical History: Reviewed history from 07/26/2010 and no changes required. Hyperlipidemia x 1 year Hypertension x 10 years Obesity Diverticulitis, hx of Diverticulosis, colon Abdominal pain unspecified Constipation Colon adenoma 2009  Past Surgical History: Reviewed history from 07/26/2010 and no changes required. S/P skin graft for trauma Hysterectomy Bladder tack Colonoscopy-2001, 2005, 2009 Oophorectomy Ureter repair  for transected left ureter Temporary ureter stent  Colectomy for sigmoid diverticulosis  Family History: Reviewed history from 11/15/2009 and no changes required. Family History of Breast Cancer:  Mother  Family History of Prostate Cancer: Father No FH of Colon Cancer:  Social History: Reviewed history from 06/30/2008 and no changes required. Never Smoked Alcohol use-no Drug use-no Married Patient does not get regular exercise.   Review of Systems  The patient denies anorexia, fever, weight loss, weight gain, vision loss, decreased hearing, hoarseness, chest pain, syncope, dyspnea on exertion, peripheral edema, prolonged cough, headaches, hemoptysis, abdominal pain, melena, hematochezia, severe indigestion/heartburn, hematuria, incontinence, genital sores, muscle weakness, suspicious skin lesions, transient blindness, difficulty walking, depression, unusual weight change, abnormal bleeding, enlarged lymph nodes, angioedema, and breast  masses.    Physical Exam  General:  Well-developed,well-nourished,in no acute distress; alert,appropriate and cooperative throughout examination Head:  Normocephalic and atraumatic without obvious abnormalities. No apparent alopecia or balding. Eyes:  pupils equal and pupils round.   Ears:  R ear normal and L ear normal.   Nose:  no external deformity and no nasal discharge.   Mouth:  Oral mucosa and oropharynx without lesions or exudates.  Teeth in good repair. Neck:  No deformities, masses, or tenderness noted. Lungs:  Normal respiratory effort, chest expands symmetrically. Lungs are clear to auscultation, no crackles or wheezes. Heart:  Normal rate and regular rhythm. S1 and S2 normal without gallop, murmur, click, rub or other extra sounds. Abdomen:  Bowel sounds positive,abdomen soft and non-tender without masses, organomegaly or hernias noted.   Impression & Recommendations:  Problem # 1:  KNEE PAIN, RIGHT, CHRONIC (ICD-719.46) Assessment Deteriorated the pt had a fall with injury to knee and now has hip pain Hip and Knee pain on the left leg Her updated medication list for this problem includes:    Adult Aspirin Ec Low Strength 81 Mg Tbec (Aspirin) ..... Once daily    Diclofenac Sodium 50 Mg Tbec (Diclofenac sodium) ..... One by mouth two times a day as needed knee pain (take with food)  Discussed strengthening exercises, use of ice or heat, and medications.   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 1 two times a day    Lotrel 5-20 Mg Caps (Amlodipine besy-benazepril hcl)  ..... One by mouth daily    Chlorthalidone 25 Mg Tabs (Chlorthalidone) .Marland Kitchen... 1/2 two times a day  BP today: 140/82 Prior BP: 140/90 (10/14/2010)  Prior 10 Yr Risk Heart Disease: 13 % (05/12/2010)  Labs Reviewed: K+: 3.8 (07/01/2010) Creat: : 1.1 (07/01/2010)   Chol: 214 (08/04/2010)   HDL: 39.60 (08/04/2010)   LDL: 114 (09/04/2008)   TG: 161.0 (08/04/2010)  Problem # 4:  HYPERLIPIDEMIA (ICD-272.4) Assessment: Unchanged  Her updated medication list for this problem includes:    Crestor 20 Mg Tabs (Rosuvastatin calcium) ..... One by mouth mwf  Labs Reviewed: SGOT: 38 (08/04/2010)   SGPT: 38 (08/04/2010)  Lipid Goals: Chol Goal: 200 (02/13/2008)   HDL Goal: 40 (02/13/2008)   LDL Goal: 130 (02/13/2008)   TG Goal: 150 (02/13/2008)  Prior 10 Yr Risk Heart Disease: 13 % (05/12/2010)   HDL:39.60 (08/04/2010), 42.80 (05/05/2010)  LDL:114 (09/04/2008), DEL (05/29/2008)  Chol:214 (08/04/2010), 217 (05/05/2010)  Trig:161.0 (08/04/2010), 152.0 (05/05/2010)  Orders: Specimen Handling ([address]) TLB-Lipid Panel (80061-LIPID)  Complete Medication List: 1)  Metoprolol Tartrate 25 Mg Tabs (Metoprolol tartrate) .Marland Kitchen.. 1 two times a day 2)  Adult Aspirin Ec Low Strength 81 Mg Tbec (Aspirin) .... Once daily 3)  Lotrel 5-20 Mg Caps (Amlodipine besy-benazepril hcl) .... One by mouth daily 4)  Crestor 20 Mg Tabs (Rosuvastatin calcium) .... One by mouth mwf 5)  Vitamin D 1000 Unit Tabs (Cholecalciferol) .... One tablet by mouth once daily 6)  Nitrostat 0.4 Mg Subl (Nitroglycerin) .Marland Kitchen.. 1 tab sl as needed cp may repeat q 5 min x 3 doses if no relief to er 7)  Allopurinol 100 Mg Tabs (Allopurinol) .... By mouth daily 8)  Diclofenac Sodium 50 Mg Tbec (Diclofenac sodium) .... One by mouth two times a day as needed knee pain (take with food) 9)  Chlorthalidone 25 Mg Tabs (Chlorthalidone) .... 1/2 two times a day  Hypertension Assessment/Plan:      The patient's hypertensive risk group is category B: At least  one risk factor (excluding diabetes) with no target organ damage.  Her calculated 10 year risk of coronary heart disease is 17 %.  Today's blood pressure is 140/82.  Her blood pressure goal is < 140/90.  Lipid Assessment/Plan:      Based on NCEP/ATP III, the patient's risk factor category is "2 or more risk factors and a calculated 10 year CAD risk of < 20%".  The patient's lipid goals are as follows: Total cholesterol goal is 200; LDL cholesterol goal is 130; HDL cholesterol goal is 40; Triglyceride goal is 150.  Her LDL cholesterol goal has been met.    Patient Instructions: 1)  Please schedule a follow-up appointment in 3 months.   Orders Added: 1)  T-Culture, Urine [[id number]] 2)  Est. Patient Level IV [91478] 3)  Specimen Handling [99000] 4)  Orthopedic Referral [Ortho] 5)  TLB-Lipid Panel [[id number]]

## 2010-12-01 NOTE — Progress Notes (Signed)
  Phone Note From Pharmacy   Caller: [facility] DrMarland Kitchen Details for Reason: refill Summary of Call: refill diclofenac 50mg . last refill date 10/14/2010 Initial call taken by: Kyung Rudd, CMA,  November 14, 2010 4:55 PM    Prescriptions: DICLOFENAC SODIUM 50 MG TBEC (DICLOFENAC SODIUM) one by mouth two times a day as needed knee pain (take with food)  #30 x 0   Entered and Authorized by:   Edwyna Perfect MD   Signed by:   Edwyna Perfect MD on 11/14/2010   Method used:   Electronically to        [facility] (retail)       [address]       [place], [place]  [address]       Ph: [phone]       Fax: [phone]   RxID:   [id number]

## 2010-12-01 NOTE — Progress Notes (Signed)
----   Converted from flag ---- ---- 11/09/2010 11:30 AM, Corky Mull wrote: Please let Dr. Shela Commons know that [facility] Left 3 messages (12/19, 12/30 and 1/5) for pt, with no return call.  [facility] then s/w pt on 01/09 - and pt told them she is not wanting to schedule right now. ------------------------------

## 2010-12-01 NOTE — Assessment & Plan Note (Signed)
Summary: LEG PAIN // RS   Vital Signs:  Patient profile:   70 year old female Weight:      280 pounds O2 Sat:      91 % on Room air Temp:     98.1 degrees F oral Pulse rate:   70 / minute BP sitting:   140 / 90  (right arm) Cuff size:   large  Vitals Entered By: Romualdo Bolk, CMA (AAMA) (October 14, 2010 10:40 AM)  O2 Flow:  Room air CC: Rt knee pain- it feels like the bone has slipped off the cap and going thru flesh. Warm to touch last night and swelling.   Primary Care Provider:  [name], MD   CC:  Rt knee pain- it feels like the bone has slipped off the cap and going thru flesh. Warm to touch last night and swelling.Marland Kitchen  History of Present Illness: Pt presents to clinic as a work in for evaluation of knee pain.  Has history of long-standing bilateral knee osteoarthritis in the past left greater than right now right greater than left.  Takes Aleve with improvement without GI  adverse effect.  No recent injury or trauma however does note intermittent instability of the knee.  States was evaluated in the past by orthopedics with the recommendation for possible TKR. Per her description describes past steroid  injections and Synvisc injections.  Also does complain of intermittent right nocturnal hand paresthesias.  No neck pain or radicular arm pain.  No focal weakness of the hand or arm.no complaint.  Preventive Screening-Counseling & Management  Alcohol-Tobacco     Smoking Status: never     Tobacco Counseling: not indicated; no tobacco use  Caffeine-Diet-Exercise     Diet Comments: eating too much     Diet Counseling: to improve diet; diet is suboptimal     Nutrition Referrals: yes     Does Patient Exercise: no  Current Medications (verified): 1)  Tenoretic 50 50-25 Mg  Tabs (Atenolol-Chlorthalidone) .... 1/2 Once Daily in The Morning 2)  Adult Aspirin Ec Low Strength 81 Mg  Tbec (Aspirin) .... Once Daily 3)  Lotrel 5-20 Mg Caps (Amlodipine Besy-Benazepril Hcl)  .... One By Mouth Daily 4)  Crestor 20 Mg Tabs (Rosuvastatin Calcium) .... One By Mouth Mwf 5)  Vitamin D 1000 Unit  Tabs (Cholecalciferol) .... One Tablet By Mouth Once Daily 6)  Nitrostat 0.4 Mg Subl (Nitroglycerin) .Marland Kitchen.. 1 Tab Sl As Needed Cp May Repeat Q 5 Min X 3 Doses If No Relief To Er 7)  Allopurinol 100 Mg Tabs (Allopurinol) .... By Mouth Daily  Allergies (verified): 1)  ! * Crestor  Review of Systems      See HPI  Physical Exam  General:  Well-developed,well-nourished,in no acute distress; alert,appropriate and cooperative throughout examination Head:  Normocephalic and atraumatic without obvious abnormalities. No apparent alopecia or balding. Msk:  examination right knee reveals mild effusion without erythema or warmth.  Tenderness along lateral ligament.  No instability noted.  Positive crepitus.  Positive Phalen's on the right without thenar muscle wasting.   Impression & Recommendations:  Problem # 1:  KNEE PAIN, RIGHT, CHRONIC (ICD-719.46) Assessment Deteriorated Clinically worsening. Attempt short course of voltaren as needed with food and no other nsaids. Cautioned regarding possible gi adverse effect. quad cane prescription provided for stability. Proceed with orthopedic consult  The following medications were removed from the medication list:    Tramadol Hcl 50 Mg Tabs (Tramadol hcl) .Marland Kitchen... 1 tab by  mouth three times a day as needed pain, use primarily at bedtime Her updated medication list for this problem includes:    Adult Aspirin Ec Low Strength 81 Mg Tbec (Aspirin) ..... Once daily    Diclofenac Sodium 50 Mg Tbec (Diclofenac sodium) ..... 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 Prescription provided. Followup if no improvement or worsening.  Complete Medication List: 1)  Tenoretic 50 50-25 Mg Tabs  (Atenolol-chlorthalidone) .... 1/2 once daily in the morning 2)  Adult Aspirin Ec Low Strength 81 Mg Tbec (Aspirin) .... Once daily 3)  Lotrel 5-20 Mg Caps (Amlodipine besy-benazepril hcl) .... One by mouth daily 4)  Crestor 20 Mg Tabs (Rosuvastatin calcium) .... One by mouth mwf 5)  Vitamin D 1000 Unit Tabs (Cholecalciferol) .... One tablet by mouth once daily 6)  Nitrostat 0.4 Mg Subl (Nitroglycerin) .Marland Kitchen.. 1 tab sl as needed cp may repeat q 5 min x 3 doses if no relief to er 7)  Allopurinol 100 Mg Tabs (Allopurinol) .... By mouth daily 8)  Diclofenac Sodium 50 Mg Tbec (Diclofenac sodium) .... One by mouth two times a day as needed knee pain (take with food) 9)  Wrist Splint Misc (Elastic bandages & supports) .... Wear at night 10)  [employer] Misc (Misc. devices)  Other Orders: UA Dipstick w/o Micro (automated)  (81003) Prescriptions: QUAD CANE  MISC (MISC. DEVICES)   #1 x 0   Entered and Authorized by:   Edwyna Perfect MD   Signed by:   Edwyna Perfect MD on 10/14/2010   Method used:   Print then Give to Patient   RxID:   [id number] WRIST SPLINT  MISC (ELASTIC BANDAGES & SUPPORTS) wear at night  #1 x 0   Entered and Authorized by:   Edwyna Perfect MD   Signed by:   Edwyna Perfect MD on 10/14/2010   Method used:   Print then Give to Patient   RxID:   [id number] DICLOFENAC SODIUM 50 MG TBEC (DICLOFENAC SODIUM) one by mouth two times a day as needed knee pain (take with food)  #30 x 0   Entered and Authorized by:   Edwyna Perfect MD   Signed by:   Edwyna Perfect MD on 10/14/2010   Method used:   Electronically to        [facility] (retail)       [address]       [place], [place]  [address]       Ph: [phone]       Fax: [phone]   RxID:   [id number]    Orders Added: 1)  UA Dipstick w/o Micro (automated)  [81003] 2)  Orthopedic Referral [Ortho] 3)  Est. Patient Level IV [[address]]

## 2010-12-07 ENCOUNTER — Telehealth: Payer: Self-pay | Admitting: *Deleted

## 2010-12-07 DIAGNOSIS — I10 Essential (primary) hypertension: Secondary | ICD-10-CM

## 2010-12-07 MED ORDER — CHLORTHALIDONE 25 MG PO TABS: 25.0000 mg | ORAL_TABLET | Freq: Every day | ORAL | Status: DC

## 2010-12-07 MED ORDER — ATENOLOL 50 MG PO TABS: 50.0000 mg | ORAL_TABLET | Freq: Every day | ORAL | Status: DC

## 2010-12-07 NOTE — Telephone Encounter (Signed)
BP is staying up to 179/104 on new meds.

## 2010-12-07 NOTE — Telephone Encounter (Signed)
Notified pt. 

## 2010-12-07 NOTE — Telephone Encounter (Signed)
Discontinue the medicine that she was changed to and call in the following prescriptions one atenolol 50 mg one by mouth daily to chlorthalidone 25 mg one by mouth daily this should be the equivalent of the Tenoretic she was on....  I have sent this medication to [facility]

## 2010-12-13 ENCOUNTER — Other Ambulatory Visit: Payer: Self-pay | Admitting: Internal Medicine

## 2010-12-15 ENCOUNTER — Ambulatory Visit
Admission: RE | Admit: 2010-12-15 | Discharge: 2010-12-15 | Disposition: A | Discharge status: Home / Self Care | Payer: Self-pay | Source: Ambulatory Visit | Attending: Internal Medicine | Admitting: Internal Medicine

## 2010-12-15 DIAGNOSIS — Z1239 Encounter for other screening for malignant neoplasm of breast: Secondary | ICD-10-CM

## 2010-12-15 IMAGING — MG MM DIGITAL SCREENING {BCG}
5 series · 5 of 5 positions shown · non-contrast
Comparison: none

DG SCREEN MAMMOGRAM BILATERAL
Bilateral CC and MLO view(s) were taken.

DIGITAL SCREENING MAMMOGRAM WITH CAD:
There are scattered fibroglandular densities.  No masses or malignant type calcifications are 
identified.  Compared with prior studies.
Images were processed with CAD.

[R CC]
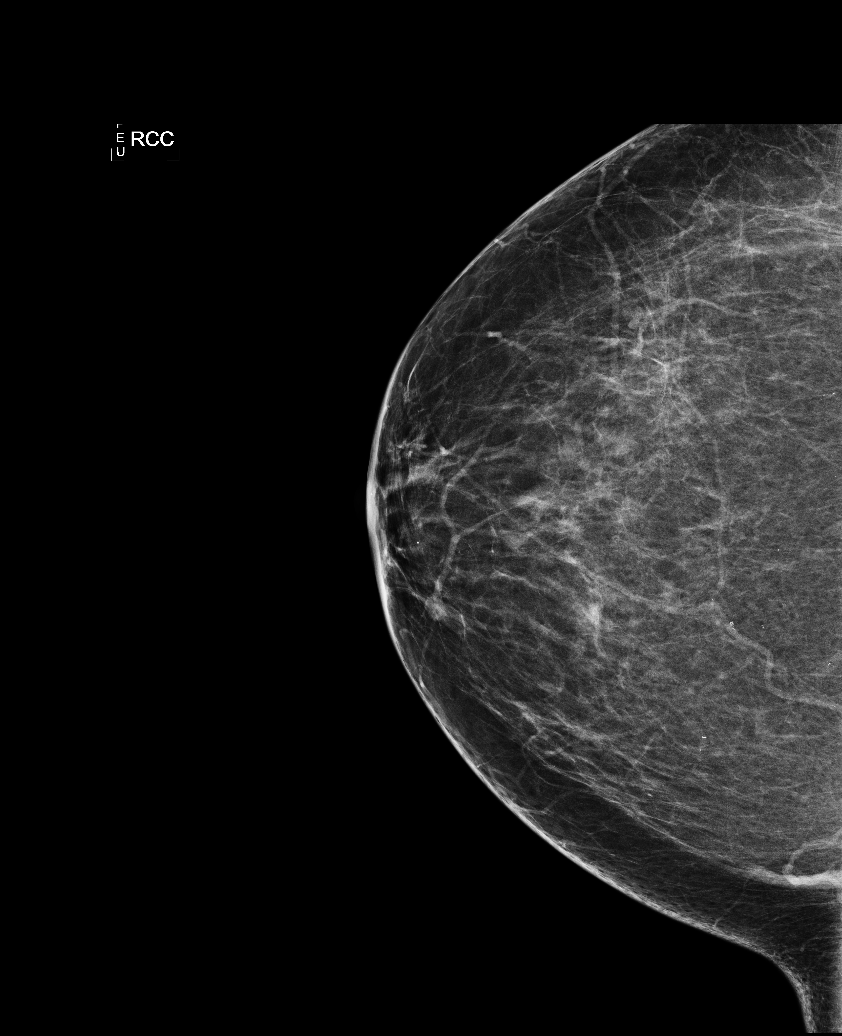

[L CC]
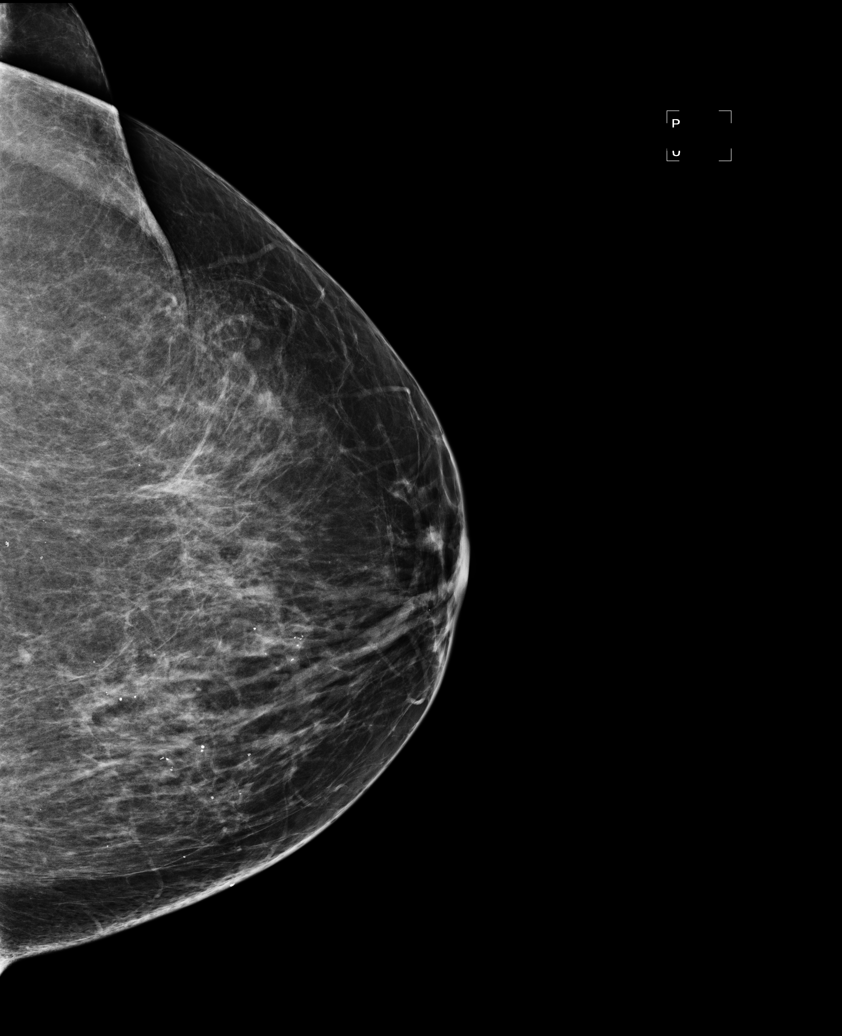

[L MLO (1 of 2)]
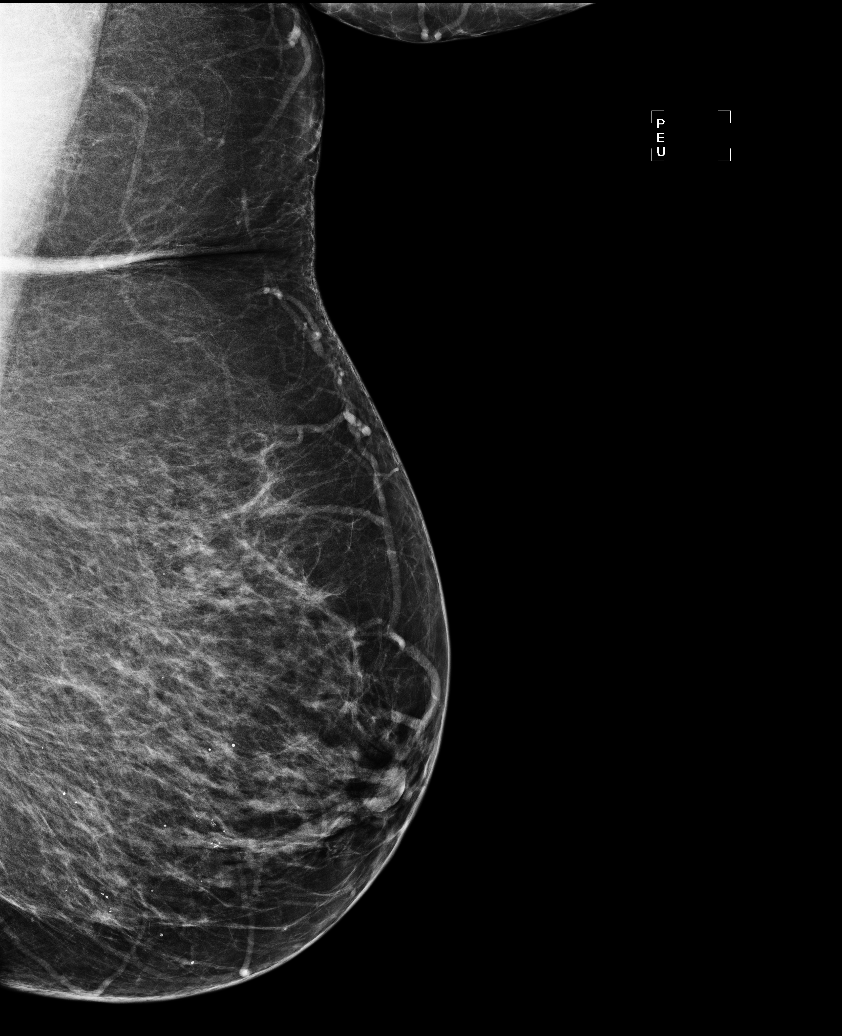

[R MLO]
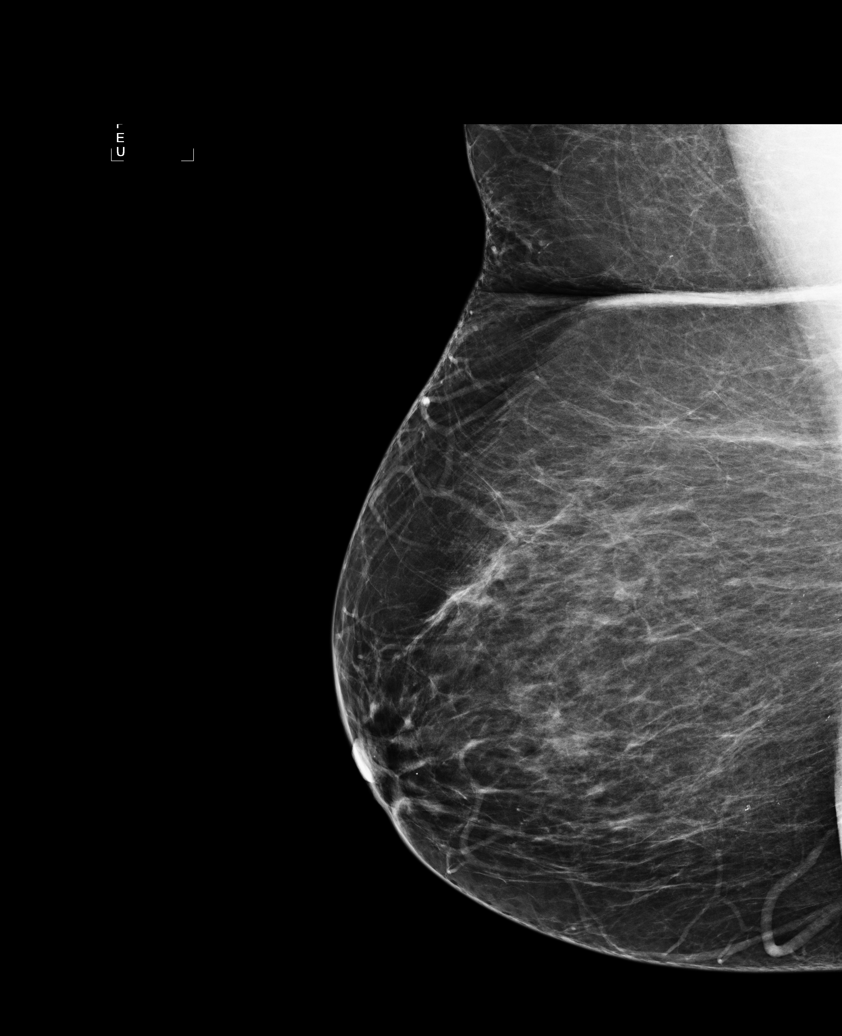

[L MLO (2 of 2)]
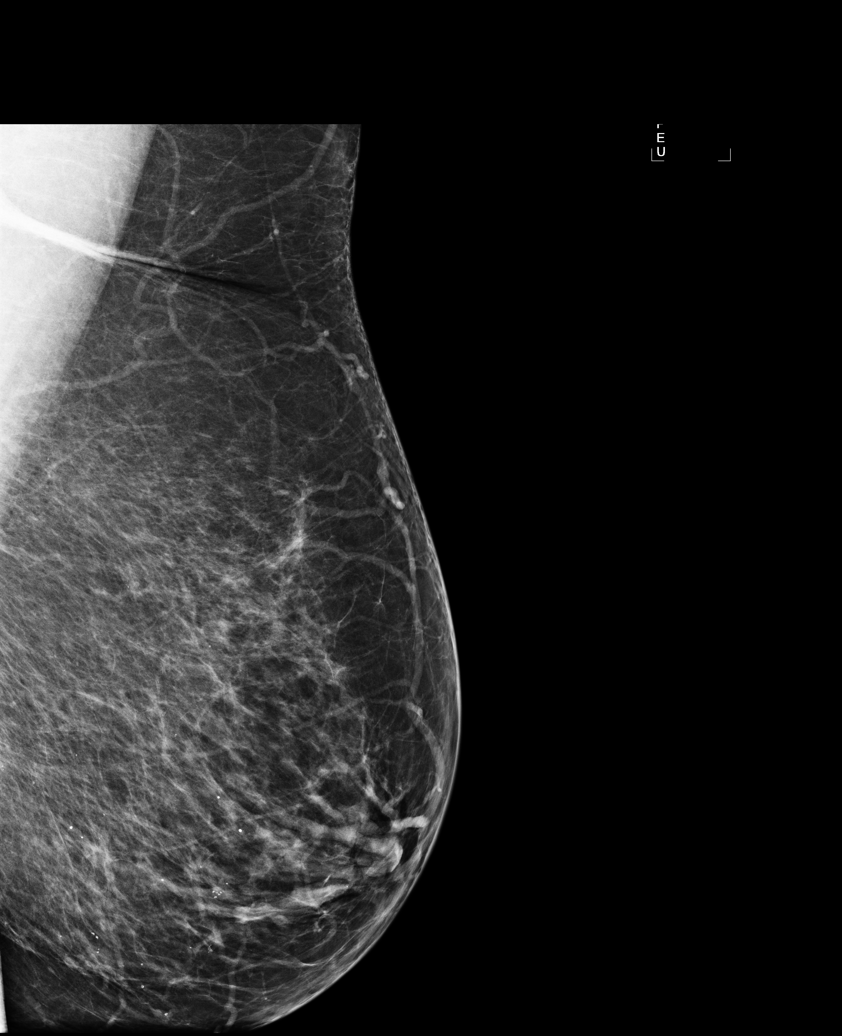

[5 of 5 positions shown; findings below may reference images not displayed]

IMPRESSION: No specific mammographic evidence of malignancy.  Next screening mammogram is recommended in one 
year.

A result letter of this screening mammogram will be mailed directly to the patient.

ASSESSMENT: Negative - BI-RADS 1

Screening mammogram in 1 year.
,

## 2011-01-02 ENCOUNTER — Other Ambulatory Visit: Payer: Self-pay | Admitting: Internal Medicine

## 2011-01-23 LAB — POCT I-STAT, CHEM 8
BUN: 23 mg/dL (ref 6–23)
Calcium, Ion: 1.17 mmol/L (ref 1.12–1.32)
Chloride: 104 mEq/L (ref 96–112)
Creatinine, Ser: 1.2 mg/dL (ref 0.4–1.2)
Glucose, Bld: 83 mg/dL (ref 70–99)
HCT: 42 % (ref 36.0–46.0)
Hemoglobin: 14.3 g/dL (ref 12.0–15.0)
Potassium: 3.7 mEq/L (ref 3.5–5.1)
Sodium: 138 mEq/L (ref 135–145)
TCO2: 29 mmol/L (ref 0–100)

## 2011-02-06 LAB — DIFFERENTIAL
Basophils Absolute: 0 10*3/uL (ref 0.0–0.1)
Basophils Absolute: 0.2 10*3/uL — ABNORMAL HIGH (ref 0.0–0.1)
Basophils Relative: 0 % (ref 0–1)
Basophils Relative: 1 % (ref 0–1)
Eosinophils Absolute: 0.6 10*3/uL (ref 0.0–0.7)
Eosinophils Absolute: 0.6 10*3/uL (ref 0.0–0.7)
Eosinophils Relative: 8 % — ABNORMAL HIGH (ref 0–5)
Eosinophils Relative: 4 % (ref 0–5)
Lymphocytes Relative: 20 % (ref 12–46)
Lymphocytes Relative: 5 % — ABNORMAL LOW (ref 12–46)
Lymphs Abs: 1.5 10*3/uL (ref 0.7–4.0)
Lymphs Abs: 0.7 10*3/uL (ref 0.7–4.0)
Monocytes Absolute: 1.1 10*3/uL — ABNORMAL HIGH (ref 0.1–1.0)
Monocytes Absolute: 1 10*3/uL (ref 0.1–1.0)
Monocytes Relative: 14 % — ABNORMAL HIGH (ref 3–12)
Monocytes Relative: 7 % (ref 3–12)
Neutro Abs: 4.3 10*3/uL (ref 1.7–7.7)
Neutro Abs: 11.6 10*3/uL — ABNORMAL HIGH (ref 1.7–7.7)
Neutrophils Relative %: 58 % (ref 43–77)
Neutrophils Relative %: 83 % — ABNORMAL HIGH (ref 43–77)

## 2011-02-06 LAB — URINE CULTURE
Colony Count: NO GROWTH
Culture: NO GROWTH

## 2011-02-06 LAB — CBC
HCT: 35.8 % — ABNORMAL LOW (ref 36.0–46.0)
HCT: 38.6 % (ref 36.0–46.0)
HCT: 40.4 % (ref 36.0–46.0)
Hemoglobin: 12.3 g/dL (ref 12.0–15.0)
Hemoglobin: 13.4 g/dL (ref 12.0–15.0)
Hemoglobin: 14 g/dL (ref 12.0–15.0)
MCHC: 34.2 g/dL (ref 30.0–36.0)
MCHC: 34.6 g/dL (ref 30.0–36.0)
MCHC: 34.7 g/dL (ref 30.0–36.0)
MCV: 89.3 fL (ref 78.0–100.0)
MCV: 89.6 fL (ref 78.0–100.0)
MCV: 89 fL (ref 78.0–100.0)
Platelets: 200 10*3/uL (ref 150–400)
Platelets: 218 10*3/uL (ref 150–400)
Platelets: 211 10*3/uL (ref 150–400)
RBC: 4.01 MIL/uL (ref 3.87–5.11)
RBC: 4.31 MIL/uL (ref 3.87–5.11)
RBC: 4.54 MIL/uL (ref 3.87–5.11)
RDW: 13.5 % (ref 11.5–15.5)
RDW: 13.6 % (ref 11.5–15.5)
RDW: 12.4 % (ref 11.5–15.5)
WBC: 7.5 10*3/uL (ref 4.0–10.5)
WBC: 9.1 10*3/uL (ref 4.0–10.5)
WBC: 14.1 10*3/uL — ABNORMAL HIGH (ref 4.0–10.5)

## 2011-02-06 LAB — URINALYSIS, ROUTINE W REFLEX MICROSCOPIC
Bilirubin Urine: NEGATIVE
Glucose, UA: NEGATIVE mg/dL
Hgb urine dipstick: NEGATIVE
Ketones, ur: NEGATIVE mg/dL
Nitrite: NEGATIVE
Protein, ur: NEGATIVE mg/dL
Specific Gravity, Urine: 1.012 (ref 1.005–1.030)
Urobilinogen, UA: 0.2 mg/dL (ref 0.0–1.0)
pH: 5 (ref 5.0–8.0)

## 2011-02-06 LAB — BASIC METABOLIC PANEL
BUN: 17 mg/dL (ref 6–23)
BUN: 25 mg/dL — ABNORMAL HIGH (ref 6–23)
CO2: 22 mEq/L (ref 19–32)
CO2: 23 mEq/L (ref 19–32)
Calcium: 8.4 mg/dL (ref 8.4–10.5)
Calcium: 8.5 mg/dL (ref 8.4–10.5)
Chloride: 106 mEq/L (ref 96–112)
Chloride: 109 mEq/L (ref 96–112)
Creatinine, Ser: 1.32 mg/dL — ABNORMAL HIGH (ref 0.4–1.2)
Creatinine, Ser: 1.53 mg/dL — ABNORMAL HIGH (ref 0.4–1.2)
GFR calc Af Amer: 41 mL/min — ABNORMAL LOW (ref >60)
GFR calc Af Amer: 49 mL/min — ABNORMAL LOW (ref >60)
GFR calc non Af Amer: 34 mL/min — ABNORMAL LOW (ref >60)
GFR calc non Af Amer: 40 mL/min — ABNORMAL LOW (ref >60)
Glucose, Bld: 101 mg/dL — ABNORMAL HIGH (ref 70–99)
Glucose, Bld: 101 mg/dL — ABNORMAL HIGH (ref 70–99)
Potassium: 3.6 mEq/L (ref 3.5–5.1)
Potassium: 4.2 mEq/L (ref 3.5–5.1)
Sodium: 137 mEq/L (ref 135–145)
Sodium: 139 mEq/L (ref 135–145)

## 2011-02-06 LAB — POCT I-STAT, CHEM 8
BUN: 29 mg/dL — ABNORMAL HIGH (ref 6–23)
Calcium, Ion: 1.04 mmol/L — ABNORMAL LOW (ref 1.12–1.32)
Chloride: 102 mEq/L (ref 96–112)
Creatinine, Ser: 1.7 mg/dL — ABNORMAL HIGH (ref 0.4–1.2)
Glucose, Bld: 131 mg/dL — ABNORMAL HIGH (ref 70–99)
HCT: 43 % (ref 36.0–46.0)
Hemoglobin: 14.6 g/dL (ref 12.0–15.0)
Potassium: 3.9 mEq/L (ref 3.5–5.1)
Sodium: 133 mEq/L — ABNORMAL LOW (ref 135–145)
TCO2: 20 mmol/L (ref 0–100)

## 2011-02-06 LAB — HEMOGLOBIN A1C
Hgb A1c MFr Bld: 5.7 % (ref 4.6–6.1)
Mean Plasma Glucose: 117 mg/dL

## 2011-02-06 LAB — CULTURE, BLOOD (ROUTINE X 2)
Culture: NO GROWTH
Culture: NO GROWTH

## 2011-02-06 LAB — URINE MICROSCOPIC-ADD ON

## 2011-02-09 ENCOUNTER — Ambulatory Visit: Payer: Self-pay | Admitting: Internal Medicine

## 2011-02-09 LAB — BASIC METABOLIC PANEL
BUN: 24 mg/dL — ABNORMAL HIGH (ref 6–23)
CO2: 24 mEq/L (ref 19–32)
Calcium: 9.6 mg/dL (ref 8.4–10.5)
Chloride: 102 mEq/L (ref 96–112)
Creatinine, Ser: 1.3 mg/dL — ABNORMAL HIGH (ref 0.4–1.2)
GFR calc Af Amer: 50 mL/min — ABNORMAL LOW (ref >60)
GFR calc non Af Amer: 41 mL/min — ABNORMAL LOW (ref >60)
Glucose, Bld: 107 mg/dL — ABNORMAL HIGH (ref 70–99)
Potassium: 3.9 mEq/L (ref 3.5–5.1)
Sodium: 136 mEq/L (ref 135–145)

## 2011-02-09 LAB — DIFFERENTIAL
Basophils Absolute: 0 10*3/uL (ref 0.0–0.1)
Basophils Relative: 0 % (ref 0–1)
Eosinophils Absolute: 0.2 10*3/uL (ref 0.0–0.7)
Eosinophils Relative: 2 % (ref 0–5)
Lymphocytes Relative: 8 % — ABNORMAL LOW (ref 12–46)
Lymphs Abs: 1.2 10*3/uL (ref 0.7–4.0)
Monocytes Absolute: 1.3 10*3/uL — ABNORMAL HIGH (ref 0.1–1.0)
Monocytes Relative: 8 % (ref 3–12)
Neutro Abs: 12.4 10*3/uL — ABNORMAL HIGH (ref 1.7–7.7)
Neutrophils Relative %: 82 % — ABNORMAL HIGH (ref 43–77)

## 2011-02-09 LAB — URINALYSIS, ROUTINE W REFLEX MICROSCOPIC
Bilirubin Urine: NEGATIVE
Glucose, UA: NEGATIVE mg/dL
Ketones, ur: NEGATIVE mg/dL
Nitrite: NEGATIVE
Protein, ur: NEGATIVE mg/dL
Specific Gravity, Urine: 1.011 (ref 1.005–1.030)
Urobilinogen, UA: 1 mg/dL (ref 0.0–1.0)
pH: 7 (ref 5.0–8.0)

## 2011-02-09 LAB — CBC
HCT: 42.5 % (ref 36.0–46.0)
Hemoglobin: 14.4 g/dL (ref 12.0–15.0)
MCHC: 34 g/dL (ref 30.0–36.0)
MCV: 89.6 fL (ref 78.0–100.0)
Platelets: 241 10*3/uL (ref 150–400)
RBC: 4.74 MIL/uL (ref 3.87–5.11)
RDW: 12.9 % (ref 11.5–15.5)
WBC: 15.1 10*3/uL — ABNORMAL HIGH (ref 4.0–10.5)

## 2011-02-09 LAB — URINE MICROSCOPIC-ADD ON

## 2011-02-24 ENCOUNTER — Encounter: Payer: Self-pay | Admitting: Internal Medicine

## 2011-02-24 ENCOUNTER — Ambulatory Visit (INDEPENDENT_AMBULATORY_CARE_PROVIDER_SITE_OTHER): Payer: MEDICARE | Admitting: Internal Medicine

## 2011-02-24 DIAGNOSIS — IMO0002 Reserved for concepts with insufficient information to code with codable children: Secondary | ICD-10-CM

## 2011-02-24 DIAGNOSIS — I10 Essential (primary) hypertension: Secondary | ICD-10-CM

## 2011-02-24 DIAGNOSIS — M1710 Unilateral primary osteoarthritis, unspecified knee: Secondary | ICD-10-CM

## 2011-02-24 DIAGNOSIS — E785 Hyperlipidemia, unspecified: Secondary | ICD-10-CM

## 2011-02-24 DIAGNOSIS — E669 Obesity, unspecified: Secondary | ICD-10-CM

## 2011-02-24 LAB — LIPID PANEL
Cholesterol: 209 mg/dL — ABNORMAL HIGH (ref 0–200)
HDL: 35 mg/dL — ABNORMAL LOW (ref >39)
LDL Cholesterol: 120 mg/dL — ABNORMAL HIGH (ref 0–99)
Total CHOL/HDL Ratio: 6 Ratio
Triglycerides: 271 mg/dL — ABNORMAL HIGH (ref <150)
VLDL: 54 mg/dL — ABNORMAL HIGH (ref 0–40)

## 2011-02-24 MED ORDER — METHYLPREDNISOLONE ACETATE 40 MG/ML IJ SUSP: 40.0000 mg | Freq: Once | INTRAMUSCULAR | Status: DC

## 2011-02-24 MED ORDER — ROSUVASTATIN CALCIUM 20 MG PO TABS: 20.0000 mg | ORAL_TABLET | Freq: Every day | ORAL | Status: DC

## 2011-02-24 NOTE — Assessment & Plan Note (Signed)
Patient has been on Crestor 20 mg she has for the past 2 months been taking it every day our recommendations were to try Monday Wednesday and Friday we'll check a lipid panel today to see if there is a marked reduction which I suspect will be possible and if this is so we will back off to [facility] Monday Wednesday Friday

## 2011-02-24 NOTE — Assessment & Plan Note (Signed)
The patient has bilateral knee pain and due to her increased weight she has degenerative joint disease in her knees bilaterally previous steroid injections in her knees were successful in alleviating some of her pain so she can exercise we will inject her knees again today of the right and the left knee  I have reviewed this patient's past medical history surgical history social history current medication list and current allergy list and have documented appropriate changes as necessary  Informed consent obtained  then Betadine was used to clean the sites local anesthesia obtained with topical spray 40 mg of Depo-Medrol and 1/2 cc of lidocaine was injected into the joint space the patient tolerated the injection well post injection care discussed with patient ice placed This was done to the left and right knee

## 2011-02-24 NOTE — Assessment & Plan Note (Signed)
The patient's blood pressure is well controlled on dual therapy but we are concerned about the persistent weight gain we talked about how the weight will destabilize her blood pressure control and put her at risk due to her multiple risk factors age cholesterol African American female and hypertension

## 2011-02-24 NOTE — Assessment & Plan Note (Signed)
The pt has gained weight 

## 2011-03-14 NOTE — Consult Note (Signed)
[name], [name] NO.:  [id number]   MEDICAL RECORD NO.:  [id number]          PATIENT TYPE:  INP   LOCATION:  1605                         FACILITY:  [facility]   PHYSICIAN:  Wilmon Arms. Corliss Skains, M.D. DATE OF BIRTH:  1941/10/27   DATE OF CONSULTATION:  08/26/2007  DATE OF DISCHARGE:                                 CONSULTATION   REASON FOR CONSULTATION:  Diverticulitis with perforation.   HISTORY OF PRESENT ILLNESS:  The patient is a 70 year old female who is  morbidly obese who has a history of diverticulitis.  The patient  estimates she has had 3 or 4 previous episodes.  She is normally managed  by Dr. Arlyce Dice.  Apparently, there has never been a discussion of  possible surgery for her recurrent diverticulitis.  Her last colonoscopy  was 2005.   She developed severe lower abdominal pain on 10/25 and was admitted to  the hospital by Dr. Leone Payor.  She was placed on IV antibiotics at that  time.  Pain is mostly located around her suprapubic region with painful  urination.  It radiates to both lower quadrants.  She does report some  diarrhea.  The patient has been on antibiotics for 2-1/2 days.  She  feels slightly better but continues to have pain and requires narcotics.  Her white count has increased from 12 to 22.  Initially, her CT scan  showed sigmoid diverticulitis with small dots of extraluminal air but no  abscess.  A repeat scan today showed increasing pericolonic inflammation  and increasing extraluminal air.  No sign of abscess.  Overall the  patient states that she feels better today than yesterday.   PAST MEDICAL HISTORY:  1. Diverticulitis times 3 or 4 attacks.  2. Morbid obesity.  3. Hypertension.  4. Elevated creatinine.  5. Cervical spine degenerative disk disease.   PAST SURGICAL HISTORY:  1. Hysterectomy.  2. Bladder suspension.  3. Exploratory laparotomy with bilateral oophorectomy in 2006.  The      patient has developed an incisional  hernia in her midline incision.   ALLERGIES:  None.   MEDICATIONS AT HOME:  1. Amlodipine.  2. Benazepril.  3. Atenolol.  4. Chlorthalidone.   FAMILY HISTORY:  Noncontributory.   SOCIAL HISTORY:  Nonsmoker, nondrinker.   PHYSICAL EXAMINATION:  VITAL SIGNS:  T-max 102.5, T current 98.9, pulse  77, respirations 18, blood pressure 149/65, sats 94%.  GENERAL:  This is an obese [place] American female who is resting  comfortably.  No apparent distress.  HEENT:  EOMI.  Sclerae anicteric.  NECK:  No mass or thyromegaly.  LUNGS:  Clear.  Normal respiratory effort.  HEART:  Regular rate and rhythm.  No murmur.  ABDOMEN:  Obese, soft, large panniculus, tender in the suprapubic region  and left lower quadrant.  Greater on the left.  She does have some bowel  sounds.  Mild guarding.  No palpable masses.   LABORATORY DATA:  BMP within normal limits.  Creatinine 1.19.  CBC:  White count 22.2, hemoglobin 13.1, platelet count 216.   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   PLAN:  The patient does not seem to be improving with conservative  management.  Would make her strict n.p.o.  Continue antibiotics.  Consider broadening her antibiotic coverage.  If she continues to run a  fever or have increasing white count, she will need exploratory  laparotomy.  She will likely end up with a Hartmann's procedure and  colostomy.  I discussed this with the patient and she seems to  understand.  We will continue to follow along with you for now.      Wilmon Arms. Tsuei, M.D.  Electronically Signed     MKT/MEDQ  D:  08/26/2007  T:  08/27/2007  Job:  [id number]

## 2011-03-14 NOTE — Op Note (Signed)
NAMEKENLY, HENCKEL NO.:  [id number]   MEDICAL RECORD NO.:  [id number]          PATIENT TYPE:  INP   LOCATION:  1230                         FACILITY:  [facility]   PHYSICIAN:  Valetta Fuller, M.D.  DATE OF BIRTH:  [birth year]   DATE OF PROCEDURE:  10/16/2007  DATE OF DISCHARGE:                               OPERATIVE REPORT   PREOPERATIVE DIAGNOSIS:  Emergent urologic consultation for transection  of ureter during exploratory laparotomy and colonic surgery for  diverticular disease.   POSTOPERATIVE DIAGNOSIS:  Transected left ureter.   PROCEDURES PERFORMED:  1. Pelvic exploration.  2. Open cystotomy.  3. Ureteral neocystotomy with closure of bladder.   SURGEON:  Valetta Fuller, M.D.   ASSISTANT:  Wilmon Arms. Tsuei, M.D.   ANESTHESIA:  General endotracheal.   INDICATIONS:  [name] is a 70 year old female.  She was undergoing a  sigmoid colectomy for diverticular disease.  The indications for that  surgery and her history should be described in Dr. Fatima Sanger note.  During  the course of the surgery, we were contacted urgently as the on-call  physician because of concern over a ureteral injury.   When we entered the operating room, Dr. Corliss Skains and his partner had  confirmed that the ureter had been transected but there was some  confusion as to whether this was the left or right ureter.  I was told  at that time that the concern had been that it had been  injured/transected during use of the LigaSure.  We scrubbed into the  operating room and could clearly see a transected ureter.  Initial  placement of a whistle-tip catheter confirmed that this was indeed the  ureter.  At that time the ureter was a mass within some inflammatory  tissues from her previous diverticular disease, and it was somewhat  difficult to determine if it the right or left ureter based on its  fairly midline position at that time.  We felt it important to try to  locate the more distal  aspect of the ureter to best determine if this  could be repaired with a ureteroureterostomy or whether the ureteral  neocystostomy would be required.  The ureter was clipped to allow for  distention and a tag suture was placed.  There was quite a bit of pelvic  scarring and the patient had apparently some bladder surgery for  incontinence in the past.  To help identify the bladder, the bladder was  filled through the indwelling Foley catheter.  Once the dome of the  bladder was identified, stay sutures were placed and an open cystotomy  was performed.  Her bladder appeared to be of low capacity and had  somewhat decreased compliance, at least based on our assessment.  It was  also somewhat tubular in shape.  The patient was given indigo carmine  and the bladder again was carefully explored.  We were able to identify  both ureteral orifices.  There was clear efflux of blue dye from the  right side of the trigone but we were unable to identify any dye coming  from  the left side, indicating that the left ureter had been the ureter  that was transected.  I attempted to place a whistle-tip catheter up the  left ureter but really could not advance it more than about 2-3 cm, and  we were never able to locate the other aspect of the ureter.  For that  reason we felt that it would be best that a ureteroneocystostomy be  performed.  At that juncture, I elected to leave the operating room and  let Dr. Corliss Skains and his partner finish the colectomy and the anastomosis.  We felt we would be better off doing a ureteral reconstruction once that  surgery had been completed.  Approximately an hour later we were called  back to the operating room to perform our ureteral reconstruction.  At  that point the ureter had distended very nicely.  We did use some sharp  and blunt dissective technique to free up the ureter more proximally,  taking great care to preserve as much vasculature as possible.  Because  of a  new fresh colonic anastomosis, we were really unable to perform a  psoas hitch to that side.  Careful inspection of the ureteral length and  the bladder had shown showed that it was possible to do the anastomosis  without any tension and without the need to provide any psoas hitch.  We  created a neocystotomy by going through the serosa and muscle and mucosa  of the bladder.  With the tag suture, the ureter was brought out about a  centimeter through the bladder.  There was no tension noted.  Some 4-0  Vicryl sutures were used to pex the outer aspect of the serosal surface  of the ureter to the serosal surface of the neocystotomy and to provide  some fixation.  The clip was removed from the distal aspect of the  ureter along with the small amount of crushed tissue.  The ureter was  then nicely spatulated.  A neocystotomy was created by utilizing  interrupted 4-0 Vicryl sutures x6.  Once that was completed, a guidewire  was placed up to the renal pelvis without difficulty.  A 7-French 24-cm  stent was then threaded over the guidewire, again without incident or  difficulty.  The distal aspect of the stent was then placed deep into  the bladder near the trigone area.  I was quite satisfied with the  anastomosis, and again the ureter had distended nicely.  There did not  appear to be any tension whatsoever.  The cystotomy was then closed with  multiple layers.  A running 3-0 Vicryl was used to close mucosa and part  of muscularis.  We then used 2-0 Vicryl suture in an interrupted manner  to perform additional second-layer closure.  A drain had already been  placed by the general surgeons within the retropubic space and deep  pelvis.  At the completion of that portion of the procedure, we left the  operating room and closure will be dictated by Dr. Corliss Skains.           ______________________________  Valetta Fuller, M.D.  Electronically Signed     DSG/MEDQ  D:  10/16/2007  T:  10/17/2007   Job:  [id number]

## 2011-03-14 NOTE — H&P (Signed)
NAMEMERNA, BALDI NO.:  [id number]   MEDICAL RECORD NO.:  [id number]          PATIENT TYPE:  EMS   LOCATION:  ED                           FACILITY:  [facility]   PHYSICIAN:  Ramiro Harvest, MD    DATE OF BIRTH:  03/06/1941   DATE OF ADMISSION:  04/13/2009  DATE OF DISCHARGE:                              HISTORY & PHYSICAL   PRIMARY CARE PHYSICIAN:  Dr. Lovell Sheehan of [facility].   UROLOGIST:  Dr. Isabel Caprice.   GASTROENTEROLOGIST:  Dr. Leone Payor.   Denae Zulueta is a 70 year old African American female, history of a  recurrent diverticulitis status post sigmoid colectomy, 09/2007,  complicated by transected left urethra, status post ureteroneocystostomy  per Dr. Isabel Caprice in December of 2008, history of recurrent UTIs, umbilical  hernia, cholelithiasis presented to the ED with a 3-day history of  dysuria, decreased urinary output, subjective fevers, shaking chills x1  day, suprapubic and bilateral lower quadrant abdominal pain/soreness,  and left-sided/flank pain and global weakness.  Patient denies any chest  pain.  Occasional shortness of breath which she usually experiences  during these recurrent infections.  No nausea.  No vomiting.  No  diarrhea.  No melena.  No hematemesis.  No hematochezia.  No focal  neurological symptoms.  Patient does endorse a bout of constipation  which has since resolved and also intermittent cough which patient  attributes to a viral upper respiratory infection.  In the ED, labs  obtained, urinalysis which was done was positive for a urinary tract  infection.  CBC with a white count of 14.1.  I-STAT 8 with a sodium of  133, glucose of 131, BUN of 29, creatinine of 1.7.  We were called to  admit the patient for further evaluation and management.   ALLERGIES:  NO KNOWN DRUG ALLERGIES.   PAST MEDICAL HISTORY:  1. History of recurrent sigmoid diverticulitis and transected left      urethra, status post sigmoid colectomy and  ureteroneocystostomy,      October 16, 2007, per Dr. Isabel Caprice and Dr. Corliss Skains.  2. History of recurrent UTIs.  3. Periumbilical hernia.  4. Cholelithiasis.  5. Diverticulitis.  6. Status post hysterectomy.  7. Status post remote bladder suspension surgery.  8. Bilateral oophorectomy in 2006.  9. Cervical spine degenerative disk disease.  10.Obesity.  11.Hypertension.  12.Gastroesophageal reflux disease.  13.Gout.  14.Hyperlipidemia.   HOME MEDICATIONS:  1. Tenoretic 50/25 half a tablet p.o. daily.  2. Aspirin 81 mg p.o. daily.  3. Pepcid 10 mg p.o. daily.  4. Lotrel 5/20 p.o. daily.  5. Triplex 135 mg p.o. b.i.d. which patient is not taking per PCP's      office notes.   SOCIAL HISTORY:  Married.  No tobacco use.  No alcohol use.  No IV drug  use.   FAMILY HISTORY:  Noncontributory.   REVIEW OF SYSTEMS:  Per HPI, otherwise negative.   PHYSICAL EXAM:  Temperature 102.9.  Blood pressure 161/57.  Pulse of 93.  Respirations 18.  Saturating 97% on room air.  GENERAL:  Patient laying on gurney in no apparent distress.  HEENT:  Normocephalic, atraumatic.  Pupils equal, round, and reactive to  light and accommodation.  Extraocular movements intact.  Oropharynx is  clear.  No lesions.  No exudates.  NECK:  Supple.  No lymphadenopathy.  RESPIRATORY:  Lungs are clear to auscultation bilaterally.  No wheezes.  No crackles.  No rhonchi.  CARDIOVASCULAR:  Regular rate and rhythm.  No murmurs, rubs, or gallops.  ABDOMEN:  Soft, nondistended.  Some tenderness to palpation in the mid  abdominal region around the umbilical region.  No flank tenderness to  palpation.  No rebound.  No guarding.  Extremities:  No clubbing, cyanosis, or edema.  NEUROLOGICAL:  Patient is alert and oriented x3.  Cranial nerves II-XII  are grossly intact.  No focal deficits.   ADMISSION LABS:  CBC, white count 14.1, hemoglobin 14.0, hematocrit  40.4, platelet count of 211, ANC of 11.6.  I-STAT 8, sodium of  133,  potassium 3.9, chloride 102, glucose 131, BUN 29, creatinine 1.7.  UA  was yellow, clear, specific gravity 1.012, pH of 5, glucose negative,  bilirubin negative, ketones negative, blood negative, protein negative,  urobilinogen 0.2, nitrite negative, leukocytes large, WBCs 11 to 28,  bacteria was few.   ASSESSMENT AND PLAN:  Ms. Aalina Brege is a 70 year old female history  of a recurrent diverticulitis, status post colectomy complicated by  transected left urethra, status post ureteroneocystostomy, December  2008, history of recurrent urinary tract infections, presented to the  emergency department with a history of dysuria, decreased urinary  output, subjective fevers, chills, and left flank pain.  1. Probable early pyelonephritis/urinary tract infection.  Check urine      cultures.  Check blood cultures x2.  CT of the abdomen and pelvis      to rule out an abscess.  Place on intravenous Rocephin.  If      worsening symptoms, may need a urology consult for further      evaluation and management.  2. Acute renal failure.  Last creatinine of 0.98 in December of 2008      and then 1.3 in March of 2010, likely a prerenal azotemia versus      renal versus post renal.  Check a fractional excretion of sodium      and hydrate with intravenous fluids and follow.  If worsening      symptoms, may consider renal ultrasound to rule out hydronephrosis.  3. Leukocytosis likely secondary to problem #1.  We will check a chest      x-ray.  Check blood cultures x2.  Place on empiric intravenous      Rocephin and follow.  4. Gastroesophageal reflux disease.  Protonix.  5. Hypertension.  Continue home dose Lotrel.  6. History of recurrent sigmoid diverticulitis, status post sigmoid      colectomy.  7. Obesity.  8. Gout.  9. Hyperlipidemia.  10.Prophylaxis:  Protonix for gastrointestinal prophylaxis.  Lovenox      for deep venous thrombosis prophylaxis.   It has been a pleasure taking care of  Ms. Jaxie Racanelli.      Ramiro Harvest, MD  Electronically Signed     DT/MEDQ  D:  04/14/2009  T:  04/14/2009  Job:  [id number]   cc:   Dr. Nash Shearer, M.D.  Fax: [phone]   Iva Boop, MD,FACG  [facility]  [address] [place], [place] [address]

## 2011-03-14 NOTE — Assessment & Plan Note (Signed)
[facility]                                 ON-CALL NOTE   NIKETA, TURNER                       MRN:          [id number]  DATE:08/24/2007                            DOB:          Mar 07, 1941    [name] has had a history of diverticulitis she tells me.  At about 2  a.m. this morning, she developed suprapubic and left lower quadrant  pain, knife-like, radiating into the back somewhat.  She feels somewhat  constipated.  There may be a mild fever; it is not clear.  No nausea or  vomiting.   I recommended that given the severity of the pain and the history, she  would really need to go to the emergency department for an evaluation to  try to determine is this diverticulitis or another cause of pain.  I  told her I would be available to help if the ER physicians needed me to  do so.     Iva Boop, MD,FACG  Electronically Signed    CEG/MedQ  DD: 08/24/2007  DT: 08/25/2007  Job #: [id number]   cc:   Barbette Hair. Arlyce Dice, MD,FACG

## 2011-03-14 NOTE — Discharge Summary (Signed)
NAMESARA, KEYS NO.:  [id number]   MEDICAL RECORD NO.:  [id number]          PATIENT TYPE:  INP   LOCATION:  1528                         FACILITY:  [facility]   PHYSICIAN:  Wilmon Arms. Corliss Skains, M.D. DATE OF BIRTH:  1941-01-23   DATE OF ADMISSION:  10/16/2007  DATE OF DISCHARGE:  10/22/2007                               DISCHARGE SUMMARY   ADMISSION DIAGNOSIS:  Recurrent sigmoid diverticulitis.   DISCHARGE DIAGNOSES:  1. Recurrent sigmoid diverticulitis.  2. Transected left ureter.   PROCEDURE PERFORMED:  Sigmoid colectomy, ureteroneocystostomy.   BRIEF HISTORY:  The patient is a 70 year old female who has had multiple  episodes of recurrent diverticulitis.  Her latest episode occurred a  couple of months ago.  She had microperforations with small contained  abscess.  She was treated with antibiotics and her symptoms improved.  She presents for elective sigmoid colectomy.  The patient is morbidly  obese.   HOSPITAL COURSE:  The patient underwent a bowel prep at home.  She was  admitted to the hospital on October 16, 2007 where she underwent an  open sigmoid colectomy.  This was extremely difficult due to her size  and the amount of intra-abdominal fat and inflammation.  During the  course of her sigmoid dissection her left ureter was inadvertently  transected.  It had become adherent to the posterior sigmoid colon  apparently due to the inflammation.  This was immediately recognized.  Dr. Isabel Caprice was consulted intraoperatively and he performed a  ureteroneocystostomy.  The patient has had previous bladder surgery and  a lot of pelvic surgery and had significant scar tissue in her pelvis.  An end-to-end stapled EEA anastomosis was created between the descending  colon and the rectum.  Postoperatively the patient has done reasonably  well.  She was given 2 units of blood at the end of her surgery and has  not required any further transfusions.  She is maintained  with a Foley  catheter.  She is developing some bladder spasms which she is being  treated with Ditropan.  Her pain has been well-controlled.  She regained  bowel function on postoperative day #4.  Her diet has been slowly  advanced up to a regular diet.  She is having bowel movements.  Her  wound shows no sign of infection.  There is a tiny bit of serous  drainage each day but this is minimal.  The patient is enrolled in the  Innoc[employer] study.  There were no signs of wound infection on the day of  discharge.   DISCHARGE INSTRUCTIONS:  The patient is to follow up with Dr. Ellin Goodie  office next week for a cystogram, a possible Foley catheter removal.  She should continue to the Ditropan for the bladder spasms.  His office  will call to set up this appointment.  She is to follow up in Dr.  Fatima Sanger office next week for staple removal.  Her JP drain has  been removed today.  She should keep dry gauze on the lower midline  incision as well as the right lower quadrant drain site.  She  is given  Percocet p.r.n. for pain.  She should resume the remainder of her  medications.  No need for antibiotics.      Wilmon Arms. Tsuei, M.D.  Electronically Signed     MKT/MEDQ  D:  10/22/2007  T:  10/22/2007  Job:  [id number]

## 2011-03-14 NOTE — Discharge Summary (Signed)
NAMEDENICE, CARDON              ACCOUNT NO.:  [id number]   MEDICAL RECORD NO.:  [id number]          PATIENT TYPE:  INP   LOCATION:  1316                         FACILITY:  [facility]   PHYSICIAN:  Georgina Quint. Plotnikov, MDDATE OF BIRTH:  November 12, 1940   DATE OF ADMISSION:  04/13/2009  DATE OF DISCHARGE:  04/16/2009                               DISCHARGE SUMMARY   DISCHARGE DIAGNOSES:  1. Early pyelonephritis/urinary tract infection.  2. Bronchitis.  3. Gastroesophageal reflux disease.  4. Hypertension.  5. History of recurrent sigmoid diverticulitis.  6. Dyslipidemia.  7. Acute renal insufficiency, resolved.  8. Hyperglycemia, hemoglobin A1c pending.   HISTORY OF PRESENT ILLNESS:  Ms. Vanpelt is a 70 year old African American  female admitted on April 13, 2009 with chief complaint of dysuria,  decreased urinary output, subjective fevers, chills, suprapubic and  bilateral lower quadrant abdominal pain and tenderness.  The patient was  admitted for further evaluation and treatment.   PAST MEDICAL HISTORY:  1. History of recurrent sigmoid diverticulitis and transected left      urethra status post sigmoid colectomy and ureteroneocystostomy      October 16, 2007 per Dr. Isabel Caprice and Dr. Corliss Skains.  2. History of recurrent urinary tract infections.  3. Periumbilical hernia.  4. Cholelithiasis.  5. Diverticulitis.  6. Status post hysterectomy.  7. Status post remote bladder suspension surgery.  8. Bilateral oophorectomy in 2006.  9. Cervical spine degenerative disk disease.  10.Obesity.  11.Hypertension.  12.GERD.  13.Gout.  14.Hyperlipidemia.   HOSPITAL COURSE:  Urinary tract infection, question early  pyelonephritis.  The patient was admitted.  Urinalysis performed at time  of admission showed large leukocytes.  Urine culture was sent which was  negative.  Blood cultures sent on admission remained no growth to date  x2.  The patient did have a fever as high as 102 on admission as well  as  leukocytosis at that time.  She was started on IV Rocephin.  A CT of the  abdomen and pelvis was obtained.  This did not show any clear  pyelonephritis.  It revealed that the surgically re-implant of the left  ureter is slightly more prominent than before.  The patient quickly  improved.  She was treated briefly for a question of bronchitis during  this admission with Zithromax.  She notes that she is having some white  sputum.  At this time, we plan to discharge the patient to home on oral  Ceftin to complete a total of 7 day course of antibiotics.  I suspect  that the patient did have a true urinary tract infection, but she notes  that she had been taking Macrodantin prophylaxis 100 mg p.o. daily as  prescribed by Dr. Isabel Caprice and that she had started some Cipro 24 hours  prior to admission.  Most likely her negative culture result is due to  the initiation of these antibiotics.  Therefore, we will continue  empiric treatment to complete a 7 day total course.  However, she is  instructed to call Dr. Lovell Sheehan should she develop fever greater than  101, chills or low back pain.  LABORATORY DATA:  Sodium 139, potassium 3.6, glucose 101, hemoglobin  12.3, hematocrit 35.8.  Blood culture is no growth to date x2.  Urine  culture negative.   DISPOSITION:  The patient will be discharged to home.   FOLLOW UP:  She is scheduled to follow up with Dr. [name] on  Friday, April 23, 2009 at 3:45 p.m.   DISCHARGE MEDICATIONS:  1. Tenorex 50/25 one-half tablet p.o. daily.  2. Aspirin 81 mg p.o. daily.  3. Pepcid 10 mg p.o. daily.  4. Lotrel 5/20 one tab p.o. daily.  5. Triplex 135 mg p.o. b.i.d.  6. Macrodantin 100 mg p.o. daily.  7. Ceftin 500 mg p.o. twice daily for 4 additional days.   Greater than 30 minutes were spent on discharge planning.      Sandford Craze, NP      Georgina Quint. Plotnikov, MD  Electronically Signed    MO/MEDQ  D:  04/16/2009  T:  04/16/2009  Job:   [id number]

## 2011-03-14 NOTE — Assessment & Plan Note (Signed)
[facility]                         GASTROENTEROLOGY OFFICE NOTE   [name], [name]                       MRN:          [id number]  DATE:01/16/2008                            DOB:          1941-10-14    CHIEF COMPLAINT:  Follow up after diverticulitis resection.   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 Isabel Caprice.  She is all  recovered from that, though she notices some fullness in her left side  at times when she eats certain things.  She does note that for 35 years  she had recurrent, fairly intense left lower quadrant pain and she has  not had any recurrence of that.  She wonders too if it was potentially  related to her ureter.  She ate some beets a couple of days ago and has  had some red stools this morning and has wondered if there is blood in  there versus the coloration from the beets.   MEDICATIONS:  1. Atenolol/Chlorthalidone 50/25 mg daily.  2. Lotrel 5/10 daily.  3. Crestor 5 mg daily.  4. Gas-X p.r.n.   ALLERGIES:  There are no known drug allergies.   PHYSICAL EXAMINATION:  Weight 245 pounds.  Pulse 64.  Blood pressure  126/80 in the right.  RECTAL:  Performed in the presence of female medical staff which  demonstrates brown Hemoccult negative stool.   She is intentionally losing some weight.  She is encouraged in this  regard.   Last colonoscopy June 08, 2004, showing diverticulosis.  She had one  in 2001 as well.   ASSESSMENT:  1. Successful surgery for recurrent diverticulitis and successful      repair of her transected left ureter.  2. Changing color of stools.  It must have been the beets.   PLAN:  I will see her back as needed.  She should have a routine  colonoscopy in 2015.    Cheryl Boop, MD,FACG  Electronically Signed   CEG/MedQ  DD: 01/16/2008   DT: 01/16/2008  Job #: [id number]   cc:   Cheryl Glaze, MD

## 2011-03-14 NOTE — Op Note (Signed)
NAMEKEIRRA, [name]              ACCOUNT NO.:  [id number]   MEDICAL RECORD NO.:  [id number]          PATIENT TYPE:  INP   LOCATION:  1230                         FACILITY:  [facility]   PHYSICIAN:  Wilmon Arms. Corliss Skains, M.D. DATE OF BIRTH:  1940/11/07   DATE OF PROCEDURE:  10/16/2007  DATE OF DISCHARGE:                               OPERATIVE REPORT   PREOPERATIVE DIAGNOSES:  Recurrent sigmoid diverticulitis.   POSTOPERATIVE DIAGNOSES:  1. Recurrent sigmoid diverticulitis.  2. Transected left ureter.   PROCEDURE PERFORMED:  1. Sigmoid colectomy.  2. Ureteroneocystostomy performed by Dr. Isabel Caprice (dictated under a      separate note).   SURGEON:  Wilmon Arms. Corliss Skains, M.D., FACS   ASSISTANT:  Lennie Muckle, MD   ANESTHESIA:  General endotracheal.   INDICATIONS:  The patient is a morbidly obese, 70 year old female who  has had several recurrent episodes of diverticulitis.  Her latest attack  was in October 2008.  She had sigmoid diverticulitis with small dots of  extraluminal air but no sign of abscess.  We were consulted.  She was  treated with IV antibiotics and clinically improved.  We recommended an  elective sigmoid colectomy.  The patient has had a history of a previous  hysterectomy, bladder suspension, exploratory laparotomy with bilateral  oophorectomy with a resultant small incisional hernia.   OPERATIVE FINDINGS:  The patient has an extreme amount of subcutaneous  adipose tissue.  She also has a large panniculus.  Her bladder was  abnormal in appearance.  It was densely adherent up to the lower  midline.  The patient had a lot of scarring in her pelvis from her  previous surgery as well as the inflammation from the diverticulitis.  After the sigmoid colon was removed, there was some bleeding from the  pelvic vein.  This was controlled with clips.  We also discovered a  transected ureter. An intraoperative consult was placed by Dr. Isabel Caprice  who performed a left  ureteroneocystostomy with placement of a stent.   DESCRIPTION OF PROCEDURE:  The patient was brought to the operating  room, placed in the supine position on the operating room table.  After  an adequate level of general anesthesia was obtained, the patient's legs  were placed in yellow fin stirrups in the lithotomy position.  A Foley  catheter was placed under sterile technique.  Her abdomen was prepped  with Betadine and draped in sterile fashion.  A time-out was taken to  assure the proper patient and proper procedure.  Her midline incision  was opened with a scalpel.  Dissection was carried down through a large  amount of subcutaneous fat to the fascia.  A fairly small hernia was  encountered in the midline.  We entered the peritoneal cavity.  There  were omental adhesions to the anterior abdominal wall.  These were taken  down with cautery.  As we extended our incision towards the lower  pelvis, the bladder appeared to be fairly long and cylindrical and was  densely adherent up to the omentum. The omentum was dissected off of the  bladder.  A  Bookwalter retractor was then attached to the table and  retractors were used to retract the lateral abdominal wall as well as  the pelvis.  The small bowel appeared normal and was packed away in the  upper abdomen. We began mobilizing the sigmoid colon by dividing the  lateral attachments with cautery.  We continued our dissection down into  the pelvis.  At the pelvic brim, there was a fairly large thickened  colon.  In fact, the patient's entire sigmoid colon was encased in fat  but only this pelvic area was thickened and hard. We continued  mobilizing the lateral attachments of the sigmoid colon on the left.  There were a lot of adhesions in the pelvis either from the  diverticulitis or from her previous hysterectomy and exploratory  laparotomy. We continued our dissection on the patient's right side. The  colon was mobilized.  We divided the  mid sigmoid colon with a GIA 75  stapler. The sigmoid mesocolon was then divided with the LigaSure  device.  We tried to stay close to the sigmoid colon.  All of this  dissection was extremely difficult due to the patient's body habitus.  The colon seemed to deviate to the right and was adherent down to the  side of the pelvis. These attachments were taken down with cautery.  We  continued dissecting down until we were below the area of inflammation.  The surrounding fat around the rectum was taken with cautery. The rectum  was then divided with a contoured green load stapler. The specimen was  oriented with a suture at its distal margin.  It was sent for gross  examination by the pathologist.  No malignancy was noted.  We then  inspected the pelvis and irrigated.  There seemed to be a considerable  amount of bleeding from a vein in the pelvic sidewall.  We had some  difficulty controlling this due to the very deep pelvis and the  patient's body habitus.  We were able to control this bleeding with two  large clips.  We once again irrigated the pelvis and hemostasis was felt  to be adequate.  At this point, we noted urine coming from a transected  ureter.  This ureter actually appeared to be in the midline and was not  in the left pelvic sidewall where it would normally be located.  It  appeared that we had transected this with the LigaSure device.  An  intraoperative consult was immediately called to the urologist.  We were  able to get a stay suture on the connective tissue around the ureteral  stump to mark it for the urologist.  Dr. Isabel Caprice  provided the  intraoperative urologic consult. His portion of the case will be  dictated separately. We had some difficulty deciding whether this ureter  which seemed to track in the midline was the right or the left. We felt  that it may represent the right ureter; however, when we opened the  bladder indigo carmine blue dye came from the right  ureteral orifice.  Therefore, this dissected ureter represented the left ureter.   We then made the decision to go ahead and incomplete our rectal  anastomosis prior to reimplanting the ureter.  We mobilized the  descending colon to allow it to reach down into the pelvis without any  tension.  The patient as previously mentioned has a large amount of fat  surrounding her colon.  We were able to dissect through all this  down to  the actual colon.  The actual colon appeared rather diminutive in size.  We were barely able to pass a 25-mm sizer into the end of the colon.  We  placed a 25-mm EEA anvil in the end of the descending colon.  This was  secured with a 2-0 Prolene pursestring suture.  Dr. Freida Busman then went  below and inserted the EEA stapler into the rectum.  She slowly advanced  this until we were at the end of the rectal stump.  The spike was  advanced and mated with the anvil.  She closed down the stapler  and we  created an end-to-end EEA anastomosis.  The anastomosis was tested with  a rigid sigmoidoscope and insufflation.  No air bubbles were noted in a  pelvis full of saline.  There was good distention of the anastomosis.  We then asked Dr. Isabel Caprice to come back to complete his portion of the  procedure.  He created a left ureteroneocystostomy and placed a stent.  His portion of the case will be dictated separately. A 19-French Blake  drain was brought in through a stab incision in the right lower quadrant  and placed deep in the pelvis behind the rectal anastomosis.  All  sponge, instrument, and needle counts were correct.  The fascia was then  closed with #1 double-stranded PDS suture.  The subcutaneous tissues  were irrigated.  The Innocoll study sponges were then placed in the  subcutaneous tissues and staples were used to close the skin.  The  patient was then extubated and brought to the recovery room in stable  condition.  All sponge, instrument, and needle counts were  correct.      Wilmon Arms. Tsuei, M.D.  Electronically Signed    MKT/MEDQ  D:  10/16/2007  T:  10/17/2007  Job:  [id number]

## 2011-03-14 NOTE — H&P (Signed)
[name], [name] NO.:  [id number]   MEDICAL RECORD NO.:  [id number]         PATIENT TYPE:  LINP   LOCATION:                               FACILITY:  [facility]   PHYSICIAN:  Iva Boop, MD,FACGDATE OF BIRTH:  14-Aug-1941   DATE OF ADMISSION:  08/24/2007  DATE OF DISCHARGE:                              HISTORY & PHYSICAL   CHIEF COMPLAINT:  Abdominal pain, fever.   HISTORY:  [name] is a 70 year old African-American women with a  history of diverticulitis.  At about 2:00 this morning she developed  acute onset of suprapubic pain.  It is worse, it is sharp with some  potential radiation to the back.  It is painful to urinate at times, or  that is just not right and she feels like she is constipated.  This is  somewhat similar to a previous episode of diverticulitis she had a few  years ago.  She called me an hour or so ago, and I recommended she come  to the emergency room for evaluation.  Her husband says that on the car  ride over here the bumps they in the road caused pain.   REVIEW OF SYSTEMS:  She had some diarrhea with elevated creatinine on  labs, but otherwise unrevealing workup.  Probably a virus, earlier in  the month.  That was self-limited.  She has neck pain from cervical  spine degenerative joint disease radiating into her right shoulder.  She  has knee pain, is due to get synovial fluid injection again for  osteoarthritis.  She gets care at [facility] for that.  She denies any  Hematuria or gross urinary problems other than those described above.  10-point review of systems is otherwise negative.   PAST MEDICAL HISTORY:  1. Diverticulitis.  2. Remote hysterectomy.  3. Remote bladder suspension surgery.  4. 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  5. Cervical spine degenerative disk disease.  6. Obesity.  7. Hypertension.  8. Recent elevated creatinine.   FAMILY  HISTORY:  Noncontributory.   DRUG ALLERGIES:  None known.   MEDICATIONS:  1. Amlodipine.  2. Benazepril 5 mg to 10 mg daily.  3. Atenolol.  4. Chlorthalidone 50/25 mg daily.   SOCIAL HISTORY:  She is married.  No tobacco or alcohol.  She is here  with her husband and daughter.   PHYSICAL EXAMINATION:  Reveals a mildly ill, obese black woman.  Pulse 108, temperature 102.9, respirations 18, blood pressure 151/73.  EYES:  Anicteric.  MOUTH:  Slightly dry but free of lesions, as is posterior pharynx.  NECK:  Supple.  No mass or thyromegaly detected.  CHEST:  Clear.  BACK:  No CVA tenderness.  HEART:  S1-S2, tachycardiac.  No rubs, murmurs or gallops.  ABDOMEN:  Obese, soft.  She has a large panniculus.  When I pick that up  and press in the suprapubic area, there is some tenderness.  Bowel  sounds are present.  There is no groin adenopathy detected.  There is no  other organomegaly or mass, but the obesity makes that difficult to  ascertain.  LOWER EXTREMITIES:  Show trace peripheral edema.  She is alert and oriented x3.  SKIN:  Warm and dry.  There is no acute rash.  Grossly nonfocal  neurologic exam.   ASSESSMENT:  Fever, suprapubic abdominal pain.  Differential includes  diverticulitis and I am very suspicious of this based upon the findings.  Urinary tract problems, other intra-abdominal process  is possible.  The  pain, when she was going over bumps in the car, suggests perineal  irritation.   PLAN:  Admit the patient to observation.  Get CT scan with or without  contrast of the IV depending upon her creatinine.  We will treat with IV  Unasyn empirically for the time-being, draw CBC with diff, CMET, and get  a urinalysis.   She also has hypertension, which we will give her antihypertensives.  We  will give her analgesics and antiemetics as needed.  Further plans  pending these studies and clinical course.  Given her age, the fever and  this overall history, I think  observation is warranted despite whenever  the CT scan would show.   Note, colonoscopy 2005 showed diverticulosis.      Iva Boop, MD,FACG  Electronically Signed     CEG/MEDQ  D:  08/24/2007  T:  08/26/2007  Job:  [id number]   cc:   [name], MD  [address] [place]  [place] [address]

## 2011-03-14 NOTE — Discharge Summary (Signed)
[name], [name] Cheryl.:  [id number]   MEDICAL RECORD Cheryl.:  [id number]          PATIENT TYPE:  INP   LOCATION:  1605                         [facility]AC[facility]  [facility]   PHYSICIAN:  Iva Boop, MD,FACGDATE OF BIRTH:  12-18-1940   DATE OF ADMISSION:  08/24/2007  DATE OF DISCHARGE:  08/31/2007                               DISCHARGE SUMMARY   ADMISSION DIAGNOSES:  38. A 70 year old female with suprapubic abdominal pain and fever,      suspect acute diverticulitis, rule out abscess.  2. Previous history of diverticulitis.  3. Status post previous hysterectomy and bladder suspension, bilateral      oophorectomy.  4. Hypertension  5. Obesity.  6. Degenerative disk disease.   DISCHARGE DIAGNOSES:  1. Improving acute sigmoid diverticulitis with microperforation.  2. Mild hypokalemia, resolved.  3. Right paracentral ventral hernia.  4. Other diagnoses as listed above.   CONSULTATIONS:  Surgery, Dr. Fannie Knee.   BRIEF HISTORY:  [name] is a pleasant 70 year old African American  female with a previous history of diverticulitis.  She presented with  onset about 2:00 a.m. on the morning of admission with acute fairly  severe suprapubic abdominal pain which she described as sharp and  radiating into her back.  She also complained of associated dysuria and  constipation.  She came to the emergency room with progressive pain,  stating she was hurting to ambulate and hurting going over bumps in the  road on the way to the emergency room.  She was seen and evaluated by  Dr. Leone Payor and admitted with probable acute diverticulitis for CT scan  of the abdomen and pelvis, IV antibiotics and supportive management.   LABORATORY STUDIES ON ADMISSION:  On October 25, WBC of 19.9, hemoglobin  13.2, hematocrit of 37.6, MCV of 86, platelets 272.  Serial labs were  obtained on October 31.  WBC was 13.9, hemoglobin 12.6, hematocrit of  36.  Potassium was 3.4 on admission, which was  corrected.  Creatinine  1.10.  GFR 50 on admission.  Albumin 3.5.  UA:  Moderate leukocyte  esterase, 0 to 2  WBCs.   X-RAY STUDIES:  CT scan of the abdomen and pelvis on October 25 showed  mild fatty infiltration of the liver and sigmoid diverticulitis with  extraluminal gas likely related to microperforation.  Followup CT on  October 27  showed persistent diverticulitis with microperforation  slightly increased, pericolonic gas.  Cheryl well-defined collection to  suggest abscess.  She had a right paracentral ventral hernia containing  only fat and mildly prominent left ureter at the level of the pelvic  brim.   HOSPITAL COURSE:  The patient was admitted to the service of Dr. Stan Head, who was covering on call.  She was placed on IV Unasyn,  morphine for pain,  Zofran for nausea, and scheduled for a CT scan of  the abdomen and pelvis with findings as outlined above.  She did have  evidence for microperforation.  Surgical consultation was obtained with  Dr. Fannie Knee, who agreed with conservative management, bowel rest and  broadening her antibiotic coverage with potential  for exploratory lap if  she had any evidence of deterioration.  Her antibiotics were changed to  Invanz.  She gradually improved over the next few days, defervesced, and  by October 28 was allowed clear liquids though with gradual advancement.  Her pain almost completely resolved by the time of discharge.  She did  have some other concerns regarding need for a sitter, bath aide,  physical therapy, etc. at home in part due to her acute illness and also  with some chronic underlying debilitation.  She was allowed discharge to  home on November 1 after completing 6 days of IV antibiotics.  She was  to follow up with Dr. Fannie Knee in his office in 2 weeks with plans for  eventual elective sigmoid colectomy.  She was also to follow up with Dr.  Leone Payor in the office in 2 to 3 weeks.  She was discharged to complete a  course of  Cipro 500 b.i.d. for 2 weeks and Flagyl 500 b.i.d. for 2  weeks.  She was to use Colace as needed, Milk of Magnesia as needed and  to continue her home doses of amlodipine, benazepril, atenolol and  chlorthalidone as previous.      Amy Esterwood, PA-C      Iva Boop, MD,FACG  Electronically Signed    AE/MEDQ  D:  09/24/2007  T:  09/24/2007  Job:  [id number]   cc:   Iva Boop, MD,FACG  [facility]  [address] [place], [place] [address]   Paul Dykes. Grant Ruts., M.D.  Fax: [phone]

## 2011-05-31 ENCOUNTER — Encounter: Payer: Self-pay | Admitting: Internal Medicine

## 2011-05-31 ENCOUNTER — Ambulatory Visit (INDEPENDENT_AMBULATORY_CARE_PROVIDER_SITE_OTHER): Payer: Medicare Other | Admitting: Internal Medicine

## 2011-05-31 DIAGNOSIS — R059 Cough, unspecified: Secondary | ICD-10-CM

## 2011-05-31 DIAGNOSIS — R05 Cough: Secondary | ICD-10-CM

## 2011-05-31 DIAGNOSIS — R609 Edema, unspecified: Secondary | ICD-10-CM

## 2011-05-31 DIAGNOSIS — R300 Dysuria: Secondary | ICD-10-CM

## 2011-05-31 HISTORY — DX: Morbid (severe) obesity due to excess calories: E66.01

## 2011-05-31 LAB — CBC WITH DIFFERENTIAL/PLATELET
Basophils Absolute: 0.1 10*3/uL (ref 0.0–0.1)
Basophils Relative: 0.7 % (ref 0.0–3.0)
Eosinophils Absolute: 0.5 10*3/uL (ref 0.0–0.7)
Eosinophils Relative: 5.3 % — ABNORMAL HIGH (ref 0.0–5.0)
HCT: 40.6 % (ref 36.0–46.0)
Hemoglobin: 13.4 g/dL (ref 12.0–15.0)
Lymphocytes Relative: 28.6 % (ref 12.0–46.0)
Lymphs Abs: 2.5 10*3/uL (ref 0.7–4.0)
MCHC: 33.1 g/dL (ref 30.0–36.0)
MCV: 91.2 fl (ref 78.0–100.0)
Monocytes Absolute: 0.7 10*3/uL (ref 0.1–1.0)
Monocytes Relative: 7.8 % (ref 3.0–12.0)
Neutro Abs: 5 10*3/uL (ref 1.4–7.7)
Neutrophils Relative %: 57.6 % (ref 43.0–77.0)
Platelets: 233 10*3/uL (ref 150.0–400.0)
RBC: 4.45 Mil/uL (ref 3.87–5.11)
RDW: 13.4 % (ref 11.5–14.6)
WBC: 8.7 10*3/uL (ref 4.5–10.5)

## 2011-05-31 MED ORDER — LINOLEIC ACID CONJUGATED 1000 MG PO CAPS: 1.0000 | ORAL_CAPSULE | Freq: Two times a day (BID) | ORAL | Status: DC

## 2011-05-31 NOTE — Patient Instructions (Signed)
Take 600 mg of Mucinex twice a day for the thick mucus to cough it up

## 2011-05-31 NOTE — Progress Notes (Signed)
Subjective:    Patient ID: [name], female    DOB: [birth year], 70 y.o.   MRN: [id number]  HPI Patient presents with multiple complaints she is followed for hypertension hyperlipidemia and she states that she has noted increasing swelling in her feet during the day they resolve in the morning when she awakens she states the swelling is not painful does not affect her ambulation her gait but does make shoes difficult to wear towards the end of the day she states that she had her son "massage her feet" and the edema did go down last night.  She does have hypertension she has gained significant weight in fact she has gained 10 pounds since her last office visit putting her into the category of morbidly obese.  She does have a history of gout but there is no swelling erythema or excessive tenderness in her feet and the swelling is bilateral and resolves in the morning.   Review of Systems  Constitutional: Negative for activity change, appetite change and fatigue.  HENT: Negative for ear pain, congestion, neck pain, postnasal drip and sinus pressure.   Eyes: Negative for redness and visual disturbance.  Respiratory: Negative for cough, shortness of breath and wheezing.   Gastrointestinal: Negative for abdominal pain and abdominal distention.  Genitourinary: Negative for dysuria, frequency and menstrual problem.  Musculoskeletal: Negative for myalgias, joint swelling and arthralgias.  Skin: Negative for rash and wound.  Neurological: Negative for dizziness, weakness and headaches.  Hematological: Negative for adenopathy. Does not bruise/bleed easily.  Psychiatric/Behavioral: Negative for sleep disturbance and decreased concentration.       No past medical history on file. No past surgical history on file.  reports that she has never smoked. She does not have any smokeless tobacco history on file. She reports that she does not drink alcohol or use illicit drugs. family history is not on  file. Allergies  Allergen Reactions  . Lipitor (Atorvastatin Calcium) Itching    Objective:   Physical Exam  Nursing note and vitals reviewed. Constitutional: She is oriented to person, place, and time. She appears well-developed and well-nourished. No distress.       Morbidly obese African American female in no apparent distress  HENT:  Head: Normocephalic and atraumatic.  Right Ear: External ear normal.  Left Ear: External ear normal.  Nose: Nose normal.  Mouth/Throat: Oropharynx is clear and moist.  Eyes: Conjunctivae and EOM are normal. Pupils are equal, round, and reactive to light.  Neck: Normal range of motion. Neck supple. No JVD present. No tracheal deviation present. No thyromegaly present.  Cardiovascular: Normal rate, regular rhythm, normal heart sounds and intact distal pulses.   No murmur heard. Pulmonary/Chest: Effort normal and breath sounds normal. She has no wheezes. She exhibits no tenderness.  Abdominal: Soft. Bowel sounds are normal.  Musculoskeletal: Normal range of motion. She exhibits edema. She exhibits no tenderness.  Lymphadenopathy:    She has no cervical adenopathy.  Neurological: She is alert and oriented to person, place, and time. She has normal reflexes. No cranial nerve deficit.  Skin: Skin is warm and dry. She is not diaphoretic.  Psychiatric: She has a normal mood and affect. Her behavior is normal.          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  She has gained 10 pounds since her last office visit we discussed appropriate interventions including going to the [facility] in participating and silver slippers as well as use of Weight Watchers or other diet programs.  Due to her age and comorbidities and history of  abdominal surgery I would not recommend that she have obesity surgery at this time however her weight problem will shorten her life if not addressed.  Her blood pressure is elevated today I believe it's resultant with a 10 pound weight weight gain we will not adjust her medicine until next visit encouraged weight loss exercise and lifestyle changes at this point.  We spent more than 30 minutes face-to-face counseling spent

## 2011-06-02 DIAGNOSIS — R300 Dysuria: Secondary | ICD-10-CM

## 2011-06-02 LAB — POCT URINALYSIS DIPSTICK
Bilirubin, UA: NEGATIVE
Blood, UA: NEGATIVE
Glucose, UA: NEGATIVE
Ketones, UA: NEGATIVE
Nitrite, UA: NEGATIVE
Protein, UA: NEGATIVE
Spec Grav, UA: 1.015
Urobilinogen, UA: 0.2
pH, UA: 7

## 2011-06-02 NOTE — Progress Notes (Signed)
Addended by: Bonnye Fava on: 06/02/2011 03:18 PM   Modules accepted: Orders

## 2011-06-07 ENCOUNTER — Telehealth: Payer: Self-pay | Admitting: *Deleted

## 2011-06-07 MED ORDER — CIPROFLOXACIN HCL 250 MG PO TABS: 250.0000 mg | ORAL_TABLET | Freq: Two times a day (BID) | ORAL | Status: AC

## 2011-06-07 NOTE — Telephone Encounter (Signed)
Pt is having UTI symptoms and wanted urine results.  Also she is having knee issues and has seen Dr Lovell Sheehan in the past and received injections in the knee.  She is said it was burning and warm to the touch.  Per Dr Lovell Sheehan reviewed urine results and call in Cipro 250 mg 1 bid for 3 days.  Also pt sees an orthopedist at [facility] so she will call them.  Pt will call back if she has any problems.  Called Cipro into [employer]

## 2011-06-17 ENCOUNTER — Encounter: Payer: Self-pay | Admitting: Family Medicine

## 2011-06-17 ENCOUNTER — Ambulatory Visit (INDEPENDENT_AMBULATORY_CARE_PROVIDER_SITE_OTHER): Payer: Medicare Other | Admitting: Family Medicine

## 2011-06-17 VITALS — BP 142/82 | HR 62 | Temp 97.9°F

## 2011-06-17 DIAGNOSIS — M545 Low back pain, unspecified: Secondary | ICD-10-CM

## 2011-06-17 DIAGNOSIS — M62830 Muscle spasm of back: Secondary | ICD-10-CM

## 2011-06-17 DIAGNOSIS — M5380 Other specified dorsopathies, site unspecified: Secondary | ICD-10-CM

## 2011-06-17 HISTORY — DX: Muscle spasm of back: M62.830

## 2011-06-17 LAB — POCT URINALYSIS DIPSTICK
Bilirubin, UA: NEGATIVE
Blood, UA: NEGATIVE
Glucose, UA: NEGATIVE
Ketones, UA: NEGATIVE
Leukocytes, UA: NEGATIVE
Nitrite, UA: NEGATIVE
Protein, UA: NEGATIVE
Spec Grav, UA: 1.01
Urobilinogen, UA: NEGATIVE
pH, UA: 6.5

## 2011-06-17 MED ORDER — MELOXICAM 15 MG PO TABS: 15.0000 mg | ORAL_TABLET | Freq: Every day | ORAL | Status: DC

## 2011-06-17 MED ORDER — CYCLOBENZAPRINE HCL 10 MG PO TABS: 10.0000 mg | ORAL_TABLET | Freq: Every day | ORAL | Status: AC

## 2011-06-17 NOTE — Progress Notes (Signed)
  Subjective:    Patient ID: [name], female    DOB: 05-29-1941, 70 y.o.   MRN: [id number]  HPI 70 year old female presents with new onset  X 1 day left lower back pain. Began in  Left mid back now has moved lower.  No recent falls.  She has been walking different due to right knee pain from arthritis.  She has had UTI in  Last week.. Took cipro x several days. No dysuria. No measured fever. UA here today clear.  No new weakness in legs, no numbness.  Not taking naprosyn any longer.  Review of Systems  Constitutional: Negative for fever and fatigue.  HENT: Negative for ear pain.   Eyes: Negative for pain.  Respiratory: Negative for chest tightness and shortness of breath.   Cardiovascular: Negative for chest pain, palpitations and leg swelling.  Gastrointestinal: Negative for abdominal pain.  Genitourinary: Negative for dysuria.       Objective:   Physical Exam  Constitutional: She is oriented to person, place, and time. She appears well-developed and well-nourished.       Morbidly obese  Neck: Normal range of motion. Neck supple.  Cardiovascular: Normal rate, regular rhythm, normal heart sounds and intact distal pulses.  Exam reveals no gallop and no friction rub.   No murmur heard. Pulmonary/Chest: Effort normal and breath sounds normal. No respiratory distress. She has no wheezes. She has no rales. She exhibits no tenderness.  Abdominal: Soft. Bowel sounds are normal. She exhibits no distension. There is no tenderness. There is no rebound and no guarding.  Musculoskeletal:       Lumbar back: She exhibits decreased range of motion and tenderness. She exhibits no bony tenderness.       PAin with alteral twisting at waist.  neg SLR, neg Faber's TTP over left paraspinous muscle complex.  Neurological: She is alert and oriented to person, place, and time. She has normal strength and normal reflexes. No cranial nerve deficit or sensory deficit. She exhibits normal muscle  tone. Coordination and gait normal.          Assessment & Plan:

## 2011-06-17 NOTE — Assessment & Plan Note (Signed)
Treat with NSAIDs and muscle relaxants . Ice and massage.  Gentle stretching exercsies.  Follow up with PCP if not improving in 2 weeks.

## 2011-06-17 NOTE — Patient Instructions (Addendum)
Most likely muscle spasm: Start meloxicam for pain and flexeril for muscle spasm.  Follow up with primary MD in 2 weeks if not improving, or earlier if pain worsening. Ice area on low back and start gentle stretching exercises.

## 2011-06-19 ENCOUNTER — Telehealth: Payer: Self-pay | Admitting: *Deleted

## 2011-06-19 NOTE — Telephone Encounter (Signed)
[facility] Triage Call Report Triage Record Num: [id number] Operator: Freddie Breech Patient Name: [name] Call Date & Time: 06/17/2011 8:33:49AM Patient Phone: [phone] PCP: [name] Patient Gender: Female PCP Fax : [phone] Patient DOB: [birth year] [facility] Name[facility] Reason for Call: C/o L flank pain, burning with urination onset 06/16/11. She was tx'd for a UTI on 06/12/11 x 3 d with Cipro. Advised see in 4 hrs per Flank Pain Protocol. Appt is sched for 1000 today per Erie Noe in the ofc. Protocol(s) Used: Flank Pain Recommended Outcome per Protocol: See Provider within 4 hours Reason for Outcome: Flank pain or low back pain AND urinary tract symptoms Care Advice: ~ 06/17/2011 8:43:45AM Page 1 of 1 [email or web address]

## 2011-06-21 NOTE — Telephone Encounter (Signed)
Was seen in saturday clinic

## 2011-07-10 ENCOUNTER — Encounter: Payer: Self-pay | Admitting: Family Medicine

## 2011-07-10 ENCOUNTER — Ambulatory Visit (INDEPENDENT_AMBULATORY_CARE_PROVIDER_SITE_OTHER): Payer: Medicare Other | Admitting: Family Medicine

## 2011-07-10 VITALS — BP 120/70 | Temp 99.9°F | Wt 282.0 lb

## 2011-07-10 DIAGNOSIS — R059 Cough, unspecified: Secondary | ICD-10-CM

## 2011-07-10 DIAGNOSIS — R509 Fever, unspecified: Secondary | ICD-10-CM

## 2011-07-10 DIAGNOSIS — R05 Cough: Secondary | ICD-10-CM

## 2011-07-10 MED ORDER — AZITHROMYCIN 250 MG PO TABS: ORAL_TABLET | ORAL | Status: AC

## 2011-07-10 NOTE — Progress Notes (Signed)
  Subjective:    Patient ID: [name], female    DOB: [birth year], 70 y.o.   MRN: [id number]  HPI Patient is seen with one half week history of intermittent cough. She had onset cough about one and one half weeks ago and seemed to be doing better until the weekend when she developed some productive cough with yellow sputum and wheezing also noted. Cough seems worse supine. She has not taken her temperature but has felt chilled and has felt warm off and on. She has no history of asthma. No history of smoking. No hemoptysis. No history of CHF.    Review of Systems  Constitutional: Positive for chills and fatigue. Negative for unexpected weight change.  HENT: Negative for congestion, sore throat and trouble swallowing.   Respiratory: Positive for cough and wheezing. Negative for shortness of breath.   Cardiovascular: Negative for chest pain, palpitations and leg swelling.  Gastrointestinal: Negative for abdominal pain.  Neurological: Negative for headaches.  Psychiatric/Behavioral: Negative for confusion.       Objective:   Physical Exam  Constitutional: She is oriented to person, place, and time. She appears well-developed and well-nourished.  HENT:  Right Ear: External ear normal.  Left Ear: External ear normal.  Mouth/Throat: Oropharynx is clear and moist.  Neck: Neck supple.  Cardiovascular: Normal rate and regular rhythm.   Pulmonary/Chest: Effort normal and breath sounds normal. No respiratory distress. She has no wheezes. She has no rales.  Lymphadenopathy:    She has no cervical adenopathy.  Neurological: She is alert and oriented to person, place, and time.          Assessment & Plan:  Cough and possible low-grade fever. No rales or other other indicators of pneumonia but concern is that this appears to be second wave of illness and whether this could represent early pneumonia. Start Zithromax. Follow up promptly for any vomiting, dyspnea or worsening fever

## 2011-07-10 NOTE — Patient Instructions (Signed)
Follow up for any increasing fever, increased shortness of breath, or any vomiting.

## 2011-07-20 ENCOUNTER — Other Ambulatory Visit: Payer: Self-pay | Admitting: Internal Medicine

## 2011-07-28 ENCOUNTER — Other Ambulatory Visit: Payer: Self-pay | Admitting: Internal Medicine

## 2011-07-28 MED ORDER — CIPROFLOXACIN HCL 500 MG PO TABS: 250.0000 mg | ORAL_TABLET | Freq: Two times a day (BID) | ORAL | Status: AC

## 2011-07-28 NOTE — Telephone Encounter (Signed)
cipro 250 bid for 5 days 

## 2011-07-28 NOTE — Telephone Encounter (Signed)
Notified pt. 

## 2011-07-28 NOTE — Telephone Encounter (Signed)
Pt called and has another uti. Pt is leaving town this a.m. And is req refill of ciprofloxacin (CIPRO) 250 MG tablet to be called in to [facility] on [address] today.

## 2011-08-02 DIAGNOSIS — N302 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: Secondary | ICD-10-CM | POA: Insufficient documentation

## 2011-08-02 DIAGNOSIS — N898 Other specified noninflammatory disorders of vagina: Secondary | ICD-10-CM | POA: Insufficient documentation

## 2011-08-02 HISTORY — DX: Other specified noninflammatory disorders of vagina: N89.8

## 2011-08-04 LAB — POCT I-STAT EG7
Acid-base deficit: 4 — ABNORMAL HIGH
Bicarbonate: 22.3
Calcium, Ion: 1.18
HCT: 31 — ABNORMAL LOW
Hemoglobin: 10.5 — ABNORMAL LOW
O2 Saturation: 77
Operator id: 100511
Patient temperature: 35.6
Potassium: 3.3 — ABNORMAL LOW
Sodium: 132 — ABNORMAL LOW
TCO2: 24
pCO2, Ven: 41.8 — ABNORMAL LOW
pH, Ven: 7.328 — ABNORMAL HIGH
pO2, Ven: 41

## 2011-08-04 LAB — COMPREHENSIVE METABOLIC PANEL
ALT: 17
ALT: 18
ALT: 21
ALT: 22
ALT: 32
ALT: 74 — ABNORMAL HIGH
ALT: 90 — ABNORMAL HIGH
AST: 21
AST: 22
AST: 24
AST: 29
AST: 45 — ABNORMAL HIGH
AST: 51 — ABNORMAL HIGH
AST: 88 — ABNORMAL HIGH
Albumin: 1.9 — ABNORMAL LOW
Albumin: 2.1 — ABNORMAL LOW
Albumin: 2.1 — ABNORMAL LOW
Albumin: 2.1 — ABNORMAL LOW
Albumin: 2.1 — ABNORMAL LOW
Albumin: 2.4 — ABNORMAL LOW
Albumin: 2.7 — ABNORMAL LOW
Alkaline Phosphatase: 29 — ABNORMAL LOW
Alkaline Phosphatase: 31 — ABNORMAL LOW
Alkaline Phosphatase: 31 — ABNORMAL LOW
Alkaline Phosphatase: 38 — ABNORMAL LOW
Alkaline Phosphatase: 43
Alkaline Phosphatase: 49
Alkaline Phosphatase: 50
BUN: 2 — ABNORMAL LOW
BUN: 2 — ABNORMAL LOW
BUN: 3 — ABNORMAL LOW
BUN: 4 — ABNORMAL LOW
BUN: 5 — ABNORMAL LOW
BUN: 6
BUN: 9
CO2: 23
CO2: 24
CO2: 24
CO2: 25
CO2: 26
CO2: 26
CO2: 26
Calcium: 7.3 — ABNORMAL LOW
Calcium: 7.5 — ABNORMAL LOW
Calcium: 7.8 — ABNORMAL LOW
Calcium: 8.2 — ABNORMAL LOW
Calcium: 8.2 — ABNORMAL LOW
Calcium: 8.6
Calcium: 8.9
Chloride: 100
Chloride: 101
Chloride: 103
Chloride: 108
Chloride: 109
Chloride: 110
Chloride: 112
Creatinine, Ser: 0.89
Creatinine, Ser: 0.9
Creatinine, Ser: 0.98
Creatinine, Ser: 0.98
Creatinine, Ser: 1.01
Creatinine, Ser: 1.07
Creatinine, Ser: 1.12
GFR calc Af Amer: 59 — ABNORMAL LOW
GFR calc Af Amer: >60
GFR calc Af Amer: >60
GFR calc Af Amer: >60
GFR calc Af Amer: >60
GFR calc Af Amer: >60
GFR calc Af Amer: >60
GFR calc non Af Amer: 49 — ABNORMAL LOW
GFR calc non Af Amer: 51 — ABNORMAL LOW
GFR calc non Af Amer: 55 — ABNORMAL LOW
GFR calc non Af Amer: 57 — ABNORMAL LOW
GFR calc non Af Amer: 57 — ABNORMAL LOW
GFR calc non Af Amer: >60
GFR calc non Af Amer: >60
Glucose, Bld: 112 — ABNORMAL HIGH
Glucose, Bld: 121 — ABNORMAL HIGH
Glucose, Bld: 122 — ABNORMAL HIGH
Glucose, Bld: 125 — ABNORMAL HIGH
Glucose, Bld: 139 — ABNORMAL HIGH
Glucose, Bld: 154 — ABNORMAL HIGH
Glucose, Bld: 188 — ABNORMAL HIGH
Potassium: 3.4 — ABNORMAL LOW
Potassium: 3.5
Potassium: 3.8
Potassium: 4
Potassium: 4
Potassium: 4.1
Potassium: 4.2
Sodium: 129 — ABNORMAL LOW
Sodium: 130 — ABNORMAL LOW
Sodium: 134 — ABNORMAL LOW
Sodium: 140
Sodium: 140
Sodium: 140
Sodium: 142
Total Bilirubin: 1
Total Bilirubin: 1
Total Bilirubin: 1
Total Bilirubin: 1.1
Total Bilirubin: 1.1
Total Bilirubin: 1.1
Total Bilirubin: 1.5 — ABNORMAL HIGH
Total Protein: 4 — ABNORMAL LOW
Total Protein: 4.1 — ABNORMAL LOW
Total Protein: 4.3 — ABNORMAL LOW
Total Protein: 4.7 — ABNORMAL LOW
Total Protein: 4.8 — ABNORMAL LOW
Total Protein: 5.3 — ABNORMAL LOW
Total Protein: 5.8 — ABNORMAL LOW

## 2011-08-04 LAB — CBC
HCT: 30.9 — ABNORMAL LOW
HCT: 32 — ABNORMAL LOW
HCT: 32.1 — ABNORMAL LOW
HCT: 33.5 — ABNORMAL LOW
HCT: 34.1 — ABNORMAL LOW
HCT: 35.1 — ABNORMAL LOW
HCT: 35.4 — ABNORMAL LOW
Hemoglobin: 10.7 — ABNORMAL LOW
Hemoglobin: 11 — ABNORMAL LOW
Hemoglobin: 11.2 — ABNORMAL LOW
Hemoglobin: 11.8 — ABNORMAL LOW
Hemoglobin: 12
Hemoglobin: 12.2
Hemoglobin: 12.3
MCHC: 34.2
MCHC: 34.6
MCHC: 34.8
MCHC: 34.8
MCHC: 34.9
MCHC: 35.2
MCHC: 35.3
MCV: 86.3
MCV: 86.5
MCV: 86.8
MCV: 87.2
MCV: 87.5
MCV: 87.8
MCV: 88.2
Platelets: 207
Platelets: 213
Platelets: 213
Platelets: 240
Platelets: 257
Platelets: 301
Platelets: 332
RBC: 3.53 — ABNORMAL LOW
RBC: 3.64 — ABNORMAL LOW
RBC: 3.65 — ABNORMAL LOW
RBC: 3.87
RBC: 3.92
RBC: 4.06
RBC: 4.07
RDW: 13.1
RDW: 13.3
RDW: 13.6
RDW: 13.8
RDW: 13.8
RDW: 14
RDW: 14.1
WBC: 10.6 — ABNORMAL HIGH
WBC: 10.8 — ABNORMAL HIGH
WBC: 12.6 — ABNORMAL HIGH
WBC: 12.8 — ABNORMAL HIGH
WBC: 13 — ABNORMAL HIGH
WBC: 13.5 — ABNORMAL HIGH
WBC: 16.2 — ABNORMAL HIGH

## 2011-08-04 LAB — CROSSMATCH
ABO/RH(D): A POS
Antibody Screen: NEGATIVE

## 2011-08-04 LAB — HEMOGLOBIN AND HEMATOCRIT, BLOOD
HCT: 26.6 — ABNORMAL LOW
Hemoglobin: 9.4 — ABNORMAL LOW

## 2011-08-04 LAB — BLOOD GAS, ARTERIAL
Acid-base deficit: 2.3 — ABNORMAL HIGH
Bicarbonate: 22.5
FIO2: 1
MECHVT: 600
O2 Saturation: 99.3
PEEP: 5
Patient temperature: 98.6
RATE: 12
TCO2: 20
pCO2 arterial: 41
pH, Arterial: 7.359
pO2, Arterial: 281 — ABNORMAL HIGH

## 2011-08-04 LAB — POCT I-STAT 4, (NA,K, GLUC, HGB,HCT)
Glucose, Bld: 91
HCT: 20 — ABNORMAL LOW
Hemoglobin: 6.8 — CL
Operator id: 100511
Potassium: 3.6
Sodium: 130 — ABNORMAL LOW

## 2011-08-04 LAB — ABO/RH: ABO/RH(D): A POS

## 2011-08-07 LAB — DIFFERENTIAL
Basophils Absolute: 0
Basophils Relative: 1
Eosinophils Absolute: 0.3
Eosinophils Relative: 4
Lymphocytes Relative: 33
Lymphs Abs: 2.8
Monocytes Absolute: 0.7
Monocytes Relative: 8
Neutro Abs: 4.6
Neutrophils Relative %: 55

## 2011-08-07 LAB — CBC
HCT: 39.4
Hemoglobin: 13.7
MCHC: 34.6
MCV: 86.3
Platelets: 329
RBC: 4.57
RDW: 13.3
WBC: 8.4

## 2011-08-07 LAB — COMPREHENSIVE METABOLIC PANEL
ALT: 37 — ABNORMAL HIGH
AST: 32
Albumin: 3.6
Alkaline Phosphatase: 57
BUN: 9
CO2: 29
Calcium: 10
Chloride: 103
Creatinine, Ser: 1.25 — ABNORMAL HIGH
GFR calc Af Amer: 52 — ABNORMAL LOW
GFR calc non Af Amer: 43 — ABNORMAL LOW
Glucose, Bld: 105 — ABNORMAL HIGH
Potassium: 4.4
Sodium: 143
Total Bilirubin: 1.3 — ABNORMAL HIGH
Total Protein: 7.2

## 2011-08-07 LAB — HEMOGLOBIN A1C
Hgb A1c MFr Bld: 6.3 — ABNORMAL HIGH
Mean Plasma Glucose: 147

## 2011-08-07 LAB — APTT: aPTT: 30

## 2011-08-07 LAB — PROTIME-INR
INR: 1
Prothrombin Time: 13.2

## 2011-08-09 LAB — DIFFERENTIAL
Basophils Absolute: 0
Basophils Absolute: 0
Basophils Absolute: 0
Basophils Absolute: 0
Basophils Absolute: 0
Basophils Relative: 0
Basophils Relative: 0
Basophils Relative: 0
Basophils Relative: 0
Basophils Relative: 0
Eosinophils Absolute: 0
Eosinophils Absolute: 0.1
Eosinophils Absolute: 0.4
Eosinophils Absolute: 0.5
Eosinophils Absolute: 0.6
Eosinophils Relative: 0
Eosinophils Relative: 1
Eosinophils Relative: 2
Eosinophils Relative: 4
Eosinophils Relative: 4
Lymphocytes Relative: 12
Lymphocytes Relative: 22
Lymphocytes Relative: 8 — ABNORMAL LOW
Lymphocytes Relative: 8 — ABNORMAL LOW
Lymphocytes Relative: 9 — ABNORMAL LOW
Lymphs Abs: 1.5
Lymphs Abs: 1.6
Lymphs Abs: 1.7
Lymphs Abs: 1.7
Lymphs Abs: 3
Monocytes Absolute: 0.4
Monocytes Absolute: 0.8 — ABNORMAL HIGH
Monocytes Absolute: 0.8 — ABNORMAL HIGH
Monocytes Absolute: 1 — ABNORMAL HIGH
Monocytes Absolute: 1.1 — ABNORMAL HIGH
Monocytes Relative: 2 — ABNORMAL LOW
Monocytes Relative: 4
Monocytes Relative: 5
Monocytes Relative: 7
Monocytes Relative: 8
Neutro Abs: 11 — ABNORMAL HIGH
Neutro Abs: 14.5 — ABNORMAL HIGH
Neutro Abs: 17.9 — ABNORMAL HIGH
Neutro Abs: 19.5 — ABNORMAL HIGH
Neutro Abs: 9.3 — ABNORMAL HIGH
Neutrophils Relative %: 67
Neutrophils Relative %: 76
Neutrophils Relative %: 84 — ABNORMAL HIGH
Neutrophils Relative %: 88 — ABNORMAL HIGH
Neutrophils Relative %: 90 — ABNORMAL HIGH

## 2011-08-09 LAB — COMPREHENSIVE METABOLIC PANEL
ALT: 20
AST: 22
Albumin: 3.5
Alkaline Phosphatase: 46
BUN: 16
CO2: 23
Calcium: 9.3
Chloride: 105
Creatinine, Ser: 1.1
GFR calc Af Amer: >60
GFR calc non Af Amer: 50 — ABNORMAL LOW
Glucose, Bld: 138 — ABNORMAL HIGH
Potassium: 3.4 — ABNORMAL LOW
Sodium: 137
Total Bilirubin: 1.8 — ABNORMAL HIGH
Total Protein: 7

## 2011-08-09 LAB — BASIC METABOLIC PANEL
BUN: 11
CO2: 25
Calcium: 8.7
Chloride: 102
Creatinine, Ser: 1.19
GFR calc Af Amer: 55 — ABNORMAL LOW
GFR calc non Af Amer: 46 — ABNORMAL LOW
Glucose, Bld: 127 — ABNORMAL HIGH
Potassium: 3.5
Sodium: 136

## 2011-08-09 LAB — URINALYSIS, ROUTINE W REFLEX MICROSCOPIC
Bilirubin Urine: NEGATIVE
Glucose, UA: NEGATIVE
Hgb urine dipstick: NEGATIVE
Ketones, ur: NEGATIVE
Nitrite: NEGATIVE
Protein, ur: NEGATIVE
Specific Gravity, Urine: 1.018
Urobilinogen, UA: 0.2
pH: 5.5

## 2011-08-09 LAB — CBC
HCT: 34.9 — ABNORMAL LOW
HCT: 35.8 — ABNORMAL LOW
HCT: 36
HCT: 37.5
HCT: 37.6
Hemoglobin: 12.1
Hemoglobin: 12.4
Hemoglobin: 12.6
Hemoglobin: 13.1
Hemoglobin: 13.2
MCHC: 34.5
MCHC: 34.6
MCHC: 35
MCHC: 35.1
MCHC: 35.2
MCV: 86.1
MCV: 86.1
MCV: 86.5
MCV: 86.6
MCV: 87.3
Platelets: 216
Platelets: 263
Platelets: 272
Platelets: 322
Platelets: 366
RBC: 4.03
RBC: 4.1
RBC: 4.18
RBC: 4.33
RBC: 4.37
RDW: 12.8
RDW: 13.1
RDW: 13.1
RDW: 13.3
RDW: 13.6
WBC: 13.9 — ABNORMAL HIGH
WBC: 14.4 — ABNORMAL HIGH
WBC: 17.3 — ABNORMAL HIGH
WBC: 19.9 — ABNORMAL HIGH
WBC: 22.2 — ABNORMAL HIGH

## 2011-08-09 LAB — URINE MICROSCOPIC-ADD ON

## 2011-08-10 LAB — URINE MICROSCOPIC-ADD ON

## 2011-08-10 LAB — URINALYSIS, ROUTINE W REFLEX MICROSCOPIC
Bilirubin Urine: NEGATIVE
Glucose, UA: NEGATIVE
Hgb urine dipstick: NEGATIVE
Ketones, ur: NEGATIVE
Nitrite: NEGATIVE
Protein, ur: NEGATIVE
Specific Gravity, Urine: 1.016
Urobilinogen, UA: 0.2
pH: 5.5

## 2011-08-10 LAB — DIFFERENTIAL
Basophils Absolute: 0
Basophils Relative: 0
Eosinophils Absolute: 0.3
Eosinophils Relative: 3
Lymphocytes Relative: 7 — ABNORMAL LOW
Lymphs Abs: 0.9
Monocytes Absolute: 0.5
Monocytes Relative: 4
Neutro Abs: 10.9 — ABNORMAL HIGH
Neutrophils Relative %: 86 — ABNORMAL HIGH

## 2011-08-10 LAB — COMPREHENSIVE METABOLIC PANEL
ALT: 29
AST: 31
Albumin: 3.7
Alkaline Phosphatase: 55
BUN: 24 — ABNORMAL HIGH
CO2: 25
Calcium: 9.4
Chloride: 104
Creatinine, Ser: 1.34 — ABNORMAL HIGH
GFR calc Af Amer: 48 — ABNORMAL LOW
GFR calc non Af Amer: 40 — ABNORMAL LOW
Glucose, Bld: 131 — ABNORMAL HIGH
Potassium: 3.9
Sodium: 140
Total Bilirubin: 1.3 — ABNORMAL HIGH
Total Protein: 7.3

## 2011-08-10 LAB — CBC
HCT: 40.5
Hemoglobin: 14
MCHC: 34.6
MCV: 86.4
Platelets: 286
RBC: 4.69
RDW: 12.9
WBC: 12.6 — ABNORMAL HIGH

## 2011-08-18 ENCOUNTER — Other Ambulatory Visit: Payer: Self-pay | Admitting: Internal Medicine

## 2011-09-01 ENCOUNTER — Encounter: Payer: Self-pay | Admitting: Internal Medicine

## 2011-09-01 ENCOUNTER — Ambulatory Visit (INDEPENDENT_AMBULATORY_CARE_PROVIDER_SITE_OTHER): Payer: Medicare Other | Admitting: Internal Medicine

## 2011-09-01 VITALS — BP 154/86 | HR 72 | Temp 98.2°F | Resp 16 | Ht 68.5 in | Wt 284.0 lb

## 2011-09-01 DIAGNOSIS — I10 Essential (primary) hypertension: Secondary | ICD-10-CM

## 2011-09-01 DIAGNOSIS — M25569 Pain in unspecified knee: Secondary | ICD-10-CM

## 2011-09-01 DIAGNOSIS — R300 Dysuria: Secondary | ICD-10-CM

## 2011-09-01 DIAGNOSIS — E785 Hyperlipidemia, unspecified: Secondary | ICD-10-CM

## 2011-09-01 LAB — BASIC METABOLIC PANEL
BUN: 17 mg/dL (ref 6–23)
CO2: 29 mEq/L (ref 19–32)
Calcium: 9.4 mg/dL (ref 8.4–10.5)
Chloride: 102 mEq/L (ref 96–112)
Creatinine, Ser: 1.2 mg/dL (ref 0.4–1.2)
GFR: 55.62 mL/min — ABNORMAL LOW (ref >60.00)
Glucose, Bld: 114 mg/dL — ABNORMAL HIGH (ref 70–99)
Potassium: 4.1 mEq/L (ref 3.5–5.1)
Sodium: 141 mEq/L (ref 135–145)

## 2011-09-01 LAB — POCT URINALYSIS DIPSTICK
Bilirubin, UA: NEGATIVE
Blood, UA: NEGATIVE
Glucose, UA: NEGATIVE
Ketones, UA: NEGATIVE
Leukocytes, UA: NEGATIVE
Nitrite, UA: NEGATIVE
Protein, UA: NEGATIVE
Spec Grav, UA: 1.02
Urobilinogen, UA: 0.2
pH, UA: 7

## 2011-09-01 LAB — CBC WITH DIFFERENTIAL/PLATELET
Basophils Absolute: 0 10*3/uL (ref 0.0–0.1)
Basophils Relative: 0.4 % (ref 0.0–3.0)
Eosinophils Absolute: 0.5 10*3/uL (ref 0.0–0.7)
Eosinophils Relative: 5.7 % — ABNORMAL HIGH (ref 0.0–5.0)
HCT: 43.7 % (ref 36.0–46.0)
Hemoglobin: 14.6 g/dL (ref 12.0–15.0)
Lymphocytes Relative: 28.2 % (ref 12.0–46.0)
Lymphs Abs: 2.3 10*3/uL (ref 0.7–4.0)
MCHC: 33.3 g/dL (ref 30.0–36.0)
MCV: 91.4 fl (ref 78.0–100.0)
Monocytes Absolute: 0.7 10*3/uL (ref 0.1–1.0)
Monocytes Relative: 8.3 % (ref 3.0–12.0)
Neutro Abs: 4.8 10*3/uL (ref 1.4–7.7)
Neutrophils Relative %: 57.4 % (ref 43.0–77.0)
Platelets: 242 10*3/uL (ref 150.0–400.0)
RBC: 4.78 Mil/uL (ref 3.87–5.11)
RDW: 14.5 % (ref 11.5–14.6)
WBC: 8.3 10*3/uL (ref 4.5–10.5)

## 2011-09-01 MED ORDER — MELOXICAM 15 MG PO TABS: 15.0000 mg | ORAL_TABLET | Freq: Every day | ORAL | Status: DC

## 2011-09-01 NOTE — Patient Instructions (Signed)
The patient is instructed to continue all medications as prescribed. Schedule followup with check out clerk upon leaving the clinic  

## 2011-09-01 NOTE — Progress Notes (Signed)
  Subjective:    Patient ID: [name], female    DOB: [birth year], 70 y.o.   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 Reviewed the need for diet and exercise    Review of Systems  Constitutional: Negative for activity change, appetite change and fatigue.  HENT: Negative for ear pain, congestion, neck pain, postnasal drip and sinus pressure.   Eyes: Negative for redness and visual disturbance.  Respiratory: Negative for cough, shortness of breath and wheezing.   Gastrointestinal: Negative for abdominal pain and abdominal distention.  Genitourinary: Negative for dysuria, frequency and menstrual problem.  Musculoskeletal: Positive for joint swelling and gait problem. Negative for myalgias and arthralgias.  Skin: Negative for rash and wound.  Neurological: Negative for dizziness, weakness and headaches.  Hematological: Negative for adenopathy. Does not bruise/bleed easily.  Psychiatric/Behavioral: Negative for sleep disturbance and decreased concentration.       Objective:   Physical Exam  Constitutional: She is oriented to person, place, and time. She appears well-developed and well-nourished. No distress.       Morbidly obese black female  HENT:  Head: Normocephalic and atraumatic.  Right Ear: External ear normal.  Left Ear: External ear normal.  Nose: Nose normal.  Mouth/Throat: Oropharynx is clear and moist.  Eyes: Conjunctivae and EOM are normal. Pupils are equal, round, and reactive to light.  Neck: Normal range of motion. Neck supple. No JVD present. No tracheal deviation present. No thyromegaly present.  Cardiovascular: Normal rate, regular rhythm, normal heart sounds and intact distal pulses.   No murmur heard. Pulmonary/Chest: Effort normal and breath sounds normal. She has no wheezes. She exhibits no tenderness.  Abdominal: Soft. Bowel sounds are normal.    Musculoskeletal: She exhibits no edema and no tenderness.       Significant arthritic changes of her knees  Lymphadenopathy:    She has no cervical adenopathy.  Neurological: She is alert and oriented to person, place, and time. She has normal reflexes. No cranial nerve deficit.  Skin: Skin is warm and dry. She is not diaphoretic.  Psychiatric: She has a normal mood and affect. Her behavior is normal.          Assessment & Plan:  Patient was recommended to proceed with total knee replacement surgery due to her needs for mobility weight loss. Her hypertension hyperlipidemia is stable. Surgical risks are increased due to immobility and obesity.

## 2011-09-04 LAB — URINE CULTURE: Colony Count: 40000

## 2011-09-11 ENCOUNTER — Telehealth: Payer: Self-pay | Admitting: *Deleted

## 2011-09-11 NOTE — Telephone Encounter (Signed)
Pt's husband called stating pt fell on her arm about one year ago, and now is having numbness and severe pain from shoulder down to hand.  Never had xray or any evaulation at that time.  ? Referral to orthopedist?

## 2011-09-11 NOTE — Telephone Encounter (Signed)
Per dr Lovell Sheehan- may go to ortho- can make own appointment

## 2011-09-11 NOTE — Telephone Encounter (Signed)
Notified pts husband.

## 2011-09-15 ENCOUNTER — Other Ambulatory Visit: Payer: Self-pay | Admitting: Internal Medicine

## 2011-10-14 ENCOUNTER — Other Ambulatory Visit: Payer: Self-pay | Admitting: Family Medicine

## 2011-10-16 ENCOUNTER — Other Ambulatory Visit: Payer: Self-pay | Admitting: *Deleted

## 2011-10-16 MED ORDER — ALLOPURINOL 100 MG PO TABS: 100.0000 mg | ORAL_TABLET | Freq: Every day | ORAL | Status: DC

## 2011-10-28 ENCOUNTER — Other Ambulatory Visit: Payer: Self-pay | Admitting: Orthopedic Surgery

## 2011-11-16 ENCOUNTER — Ambulatory Visit (INDEPENDENT_AMBULATORY_CARE_PROVIDER_SITE_OTHER): Payer: Medicare Other | Admitting: Internal Medicine

## 2011-11-16 VITALS — BP 140/80 | HR 76 | Temp 98.1°F | Resp 16 | Ht 69.0 in | Wt 282.0 lb

## 2011-11-16 DIAGNOSIS — I10 Essential (primary) hypertension: Secondary | ICD-10-CM

## 2011-11-16 DIAGNOSIS — M5412 Radiculopathy, cervical region: Secondary | ICD-10-CM

## 2011-11-16 DIAGNOSIS — M1710 Unilateral primary osteoarthritis, unspecified knee: Secondary | ICD-10-CM

## 2011-11-16 DIAGNOSIS — J069 Acute upper respiratory infection, unspecified: Secondary | ICD-10-CM

## 2011-11-16 NOTE — Patient Instructions (Signed)
I agree that the weakness in your right hand and the numbness she experienced may be coming from a pinched nerve in her neck and an MRI may be the best way to diagnose this.  However there are things that you can do to help one is to think about getting a pillow that cradled her neck seemed at the store though special pillows that they say helped the neck these sometimes significantly help people who pinched their nerve during their sleep

## 2011-11-16 NOTE — Progress Notes (Signed)
Subjective:    Patient ID: [name], female    DOB: [birth year], 71 y.o.   MRN: [id number]  HPI  The pt has severe DJD of the knees and will need replacement Has numbness in right hand and may have cervical disc dz as a possible etiology The numbness occurs at night, has some weakness, orthopedist has orderer MRI Of her cervical spine. Her blood pressure is moderately well controlled she remains morbidly obese with significant complications from her obesity including severe degenerative joint disease hypertension and GERD She is an additional acute complaint of mild upper respiratory tract symptoms appear to be viral in the allergy   Review of Systems  Constitutional: Negative for activity change, appetite change and fatigue.  HENT: Negative for ear pain, congestion, neck pain, postnasal drip and sinus pressure.   Eyes: Negative for redness and visual disturbance.  Respiratory: Negative for cough, shortness of breath and wheezing.   Gastrointestinal: Negative for abdominal pain and abdominal distention.  Genitourinary: Negative for dysuria, frequency and menstrual problem.  Musculoskeletal: Negative for myalgias, joint swelling and arthralgias.  Skin: Negative for rash and wound.  Neurological: Negative for dizziness, weakness and headaches.  Hematological: Negative for adenopathy. Does not bruise/bleed easily.  Psychiatric/Behavioral: Negative for sleep disturbance and decreased concentration.   Past Medical History  Diagnosis Date  . Hyperlipidemia   . Hypertension   . Obesity   . Diverticulitis hx of  . Diverticulosis   . Abdominal pain   . Constipation   . Colon adenoma     History   Social History  . Marital Status: Married    Spouse Name: N/A    Number of Children: N/A  . Years of Education: N/A   Occupational History  . Not on file.   Social History Main Topics  . Smoking status: Never Smoker   . Smokeless tobacco: Not on file  . Alcohol Use: No  . Drug  Use: No  . Sexually Active: Yes   Other Topics Concern  . Not on file   Social History Narrative  . No narrative on file    Past Surgical History  Procedure Date  . Skin draft for trauma   . Abdominal hysterectomy   . Bladder tack   . Colonoscopy [id number]   . Oophorectomy   . Ureter repair for tyransected left ureter   . Temporary ureter stent     Family History  Problem Relation Age of Onset  . Breast cancer Mother   . Prostate cancer Father   . Colon cancer Father     Allergies  Allergen Reactions  . Lipitor (Atorvastatin Calcium) Itching    Current Outpatient Prescriptions on File Prior to Visit  Medication Sig Dispense Refill  . allopurinol (ZYLOPRIM) 100 MG tablet Take 1 tablet (100 mg total) by mouth daily.  30 tablet  6  . amLODipine-benazepril (LOTREL) 5-20 MG per capsule TAKE ONE CAPSULE BY MOUTH EVERY DAY  30 capsule  11  . meloxicam (MOBIC) 15 MG tablet Take 1 tablet (15 mg total) by mouth daily.  30 tablet  1  . naproxen sodium (ANAPROX) 220 MG tablet Take 220 mg by mouth 2 (two) times daily with a meal.        . rosuvastatin (CRESTOR) 20 MG tablet Take 1 tablet (20 mg total) by mouth at bedtime.  30 tablet  11  . DISCONTD: atenolol-chlorthalidone (TENORETIC) 50-25 MG per tablet TAKE ONE-HALF TABLET BY MOUTH EVERY DAY IN THE MORNING  30 tablet  11   Current Facility-Administered Medications on File Prior to Visit  Medication Dose Route Frequency Provider Last Rate Last Dose  . methylPREDNISolone acetate (DEPO-MEDROL) injection 40 mg  40 mg Intra-articular Once Carrie Mew, MD        BP 140/80  Pulse 76  Temp 98.1 F (36.7 C)  Resp 16  Ht 5\' 9"  (1.753 m)  Wt 282 lb (127.914 kg)  BMI 41.64 kg/m2       Objective:   Physical Exam  Nursing note and vitals reviewed. Constitutional: She is oriented to person, place, and time. She appears well-developed and well-nourished. No distress.  HENT:  Head: Normocephalic and atraumatic.    Right Ear: External ear normal.  Left Ear: External ear normal.  Nose: Nose normal.  Mouth/Throat: Oropharynx is clear and moist.  Eyes: Conjunctivae and EOM are normal. Pupils are equal, round, and reactive to light.  Neck: Normal range of motion. Neck supple. No JVD present. No tracheal deviation present. No thyromegaly present.  Cardiovascular: Normal rate, regular rhythm, normal heart sounds and intact distal pulses.   No murmur heard. Pulmonary/Chest: Effort normal and breath sounds normal. She has no wheezes. She exhibits no tenderness.  Abdominal: Soft. Bowel sounds are normal.  Musculoskeletal: Normal range of motion. She exhibits no edema and no tenderness.  Lymphadenopathy:    She has no cervical adenopathy.  Neurological: She is alert and oriented to person, place, and time. She has normal reflexes. No cranial nerve deficit.  Skin: Skin is warm and dry. She is not diaphoretic.  Psychiatric: She has a normal mood and affect. Her behavior is normal.          Assessment & Plan:  We discussed a referral to orthopedics if there is degenerative joint disease of the neck that requires intervention would refer her to a neurosurgeon.  Physical therapy is often effective in alleviating to degenerative joint disease of the neck and we recommended that she consider this option.  Her blood pressure is moderately well controlled but a complicating factor remains her morbid obesity we addressed her obesity her diet and the need to again an exercise program.  We recommended the use of Mucinex fast max liquid for her upper respiratory tract symptoms

## 2011-11-29 ENCOUNTER — Other Ambulatory Visit: Payer: Self-pay | Admitting: Internal Medicine

## 2011-12-04 ENCOUNTER — Ambulatory Visit: Payer: Medicare Other | Admitting: Internal Medicine

## 2011-12-28 ENCOUNTER — Encounter (HOSPITAL_COMMUNITY): Payer: Self-pay | Admitting: Pharmacy Technician

## 2011-12-29 ENCOUNTER — Ambulatory Visit (HOSPITAL_COMMUNITY)
Admission: RE | Admit: 2011-12-29 | Discharge: 2011-12-29 | Disposition: A | Discharge status: Home / Self Care | Payer: Medicare Other | Source: Ambulatory Visit | Attending: Orthopedic Surgery | Admitting: Orthopedic Surgery

## 2011-12-29 ENCOUNTER — Other Ambulatory Visit: Payer: Self-pay

## 2011-12-29 ENCOUNTER — Encounter (HOSPITAL_COMMUNITY)
Admission: RE | Admit: 2011-12-29 | Discharge: 2011-12-29 | Disposition: A | Discharge status: Home / Self Care | Payer: Medicare Other | Source: Ambulatory Visit | Attending: Orthopedic Surgery | Admitting: Orthopedic Surgery

## 2011-12-29 ENCOUNTER — Encounter (HOSPITAL_COMMUNITY): Payer: Self-pay

## 2011-12-29 DIAGNOSIS — M1710 Unilateral primary osteoarthritis, unspecified knee: Secondary | ICD-10-CM | POA: Insufficient documentation

## 2011-12-29 DIAGNOSIS — I10 Essential (primary) hypertension: Secondary | ICD-10-CM | POA: Insufficient documentation

## 2011-12-29 DIAGNOSIS — M47814 Spondylosis without myelopathy or radiculopathy, thoracic region: Secondary | ICD-10-CM | POA: Insufficient documentation

## 2011-12-29 DIAGNOSIS — K219 Gastro-esophageal reflux disease without esophagitis: Secondary | ICD-10-CM | POA: Insufficient documentation

## 2011-12-29 DIAGNOSIS — Z79899 Other long term (current) drug therapy: Secondary | ICD-10-CM | POA: Insufficient documentation

## 2011-12-29 DIAGNOSIS — Z01812 Encounter for preprocedural laboratory examination: Secondary | ICD-10-CM | POA: Insufficient documentation

## 2011-12-29 DIAGNOSIS — Z01810 Encounter for preprocedural cardiovascular examination: Secondary | ICD-10-CM | POA: Insufficient documentation

## 2011-12-29 DIAGNOSIS — E669 Obesity, unspecified: Secondary | ICD-10-CM | POA: Insufficient documentation

## 2011-12-29 DIAGNOSIS — E785 Hyperlipidemia, unspecified: Secondary | ICD-10-CM | POA: Insufficient documentation

## 2011-12-29 DIAGNOSIS — Z01818 Encounter for other preprocedural examination: Secondary | ICD-10-CM | POA: Insufficient documentation

## 2011-12-29 HISTORY — DX: Adverse effect of unspecified anesthetic, initial encounter: T41.45XA

## 2011-12-29 HISTORY — DX: Paresthesia of skin: R20.2

## 2011-12-29 HISTORY — DX: Pneumonia, unspecified organism: J18.9

## 2011-12-29 HISTORY — DX: Unspecified osteoarthritis, unspecified site: M19.90

## 2011-12-29 HISTORY — DX: Gastro-esophageal reflux disease without esophagitis: K21.9

## 2011-12-29 HISTORY — DX: Paresthesia of skin: R20.0

## 2011-12-29 HISTORY — DX: Other complications of anesthesia, initial encounter: T88.59XA

## 2011-12-29 LAB — COMPREHENSIVE METABOLIC PANEL
ALT: 29 U/L (ref 0–35)
AST: 30 U/L (ref 0–37)
Albumin: 3.9 g/dL (ref 3.5–5.2)
Alkaline Phosphatase: 48 U/L (ref 39–117)
BUN: 22 mg/dL (ref 6–23)
CO2: 27 mEq/L (ref 19–32)
Calcium: 9.4 mg/dL (ref 8.4–10.5)
Chloride: 104 mEq/L (ref 96–112)
Creatinine, Ser: 1.24 mg/dL — ABNORMAL HIGH (ref 0.50–1.10)
GFR calc Af Amer: 50 mL/min — ABNORMAL LOW (ref >90)
GFR calc non Af Amer: 43 mL/min — ABNORMAL LOW (ref >90)
Glucose, Bld: 109 mg/dL — ABNORMAL HIGH (ref 70–99)
Potassium: 4.2 mEq/L (ref 3.5–5.1)
Sodium: 138 mEq/L (ref 135–145)
Total Bilirubin: 0.6 mg/dL (ref 0.3–1.2)
Total Protein: 7.4 g/dL (ref 6.0–8.3)

## 2011-12-29 LAB — URINALYSIS, ROUTINE W REFLEX MICROSCOPIC
Bilirubin Urine: NEGATIVE
Glucose, UA: NEGATIVE mg/dL
Hgb urine dipstick: NEGATIVE
Ketones, ur: NEGATIVE mg/dL
Nitrite: NEGATIVE
Protein, ur: NEGATIVE mg/dL
Specific Gravity, Urine: 1.017 (ref 1.005–1.030)
Urobilinogen, UA: 0.2 mg/dL (ref 0.0–1.0)
pH: 6 (ref 5.0–8.0)

## 2011-12-29 LAB — APTT: aPTT: 32 seconds (ref 24–37)

## 2011-12-29 LAB — CBC
HCT: 41.4 % (ref 36.0–46.0)
Hemoglobin: 14 g/dL (ref 12.0–15.0)
MCH: 29.8 pg (ref 26.0–34.0)
MCHC: 33.8 g/dL (ref 30.0–36.0)
MCV: 88.1 fL (ref 78.0–100.0)
Platelets: 229 10*3/uL (ref 150–400)
RBC: 4.7 MIL/uL (ref 3.87–5.11)
RDW: 13.2 % (ref 11.5–15.5)
WBC: 8.6 10*3/uL (ref 4.0–10.5)

## 2011-12-29 LAB — SURGICAL PCR SCREEN
MRSA, PCR: NEGATIVE
Staphylococcus aureus: NEGATIVE

## 2011-12-29 LAB — PROTIME-INR
INR: 1 (ref 0.00–1.49)
Prothrombin Time: 13.4 seconds (ref 11.6–15.2)

## 2011-12-29 LAB — URINE MICROSCOPIC-ADD ON

## 2011-12-29 IMAGING — CR DG CHEST 2V
2 series · 2 of 2 positions shown · non-contrast
Comparison: [DATE]

CLINICAL DATA: Preop for right total knee replacement.
Hypertension.  Hyperlipidemia.  Obesity.

CHEST - 2 VIEW

[w chest pa]
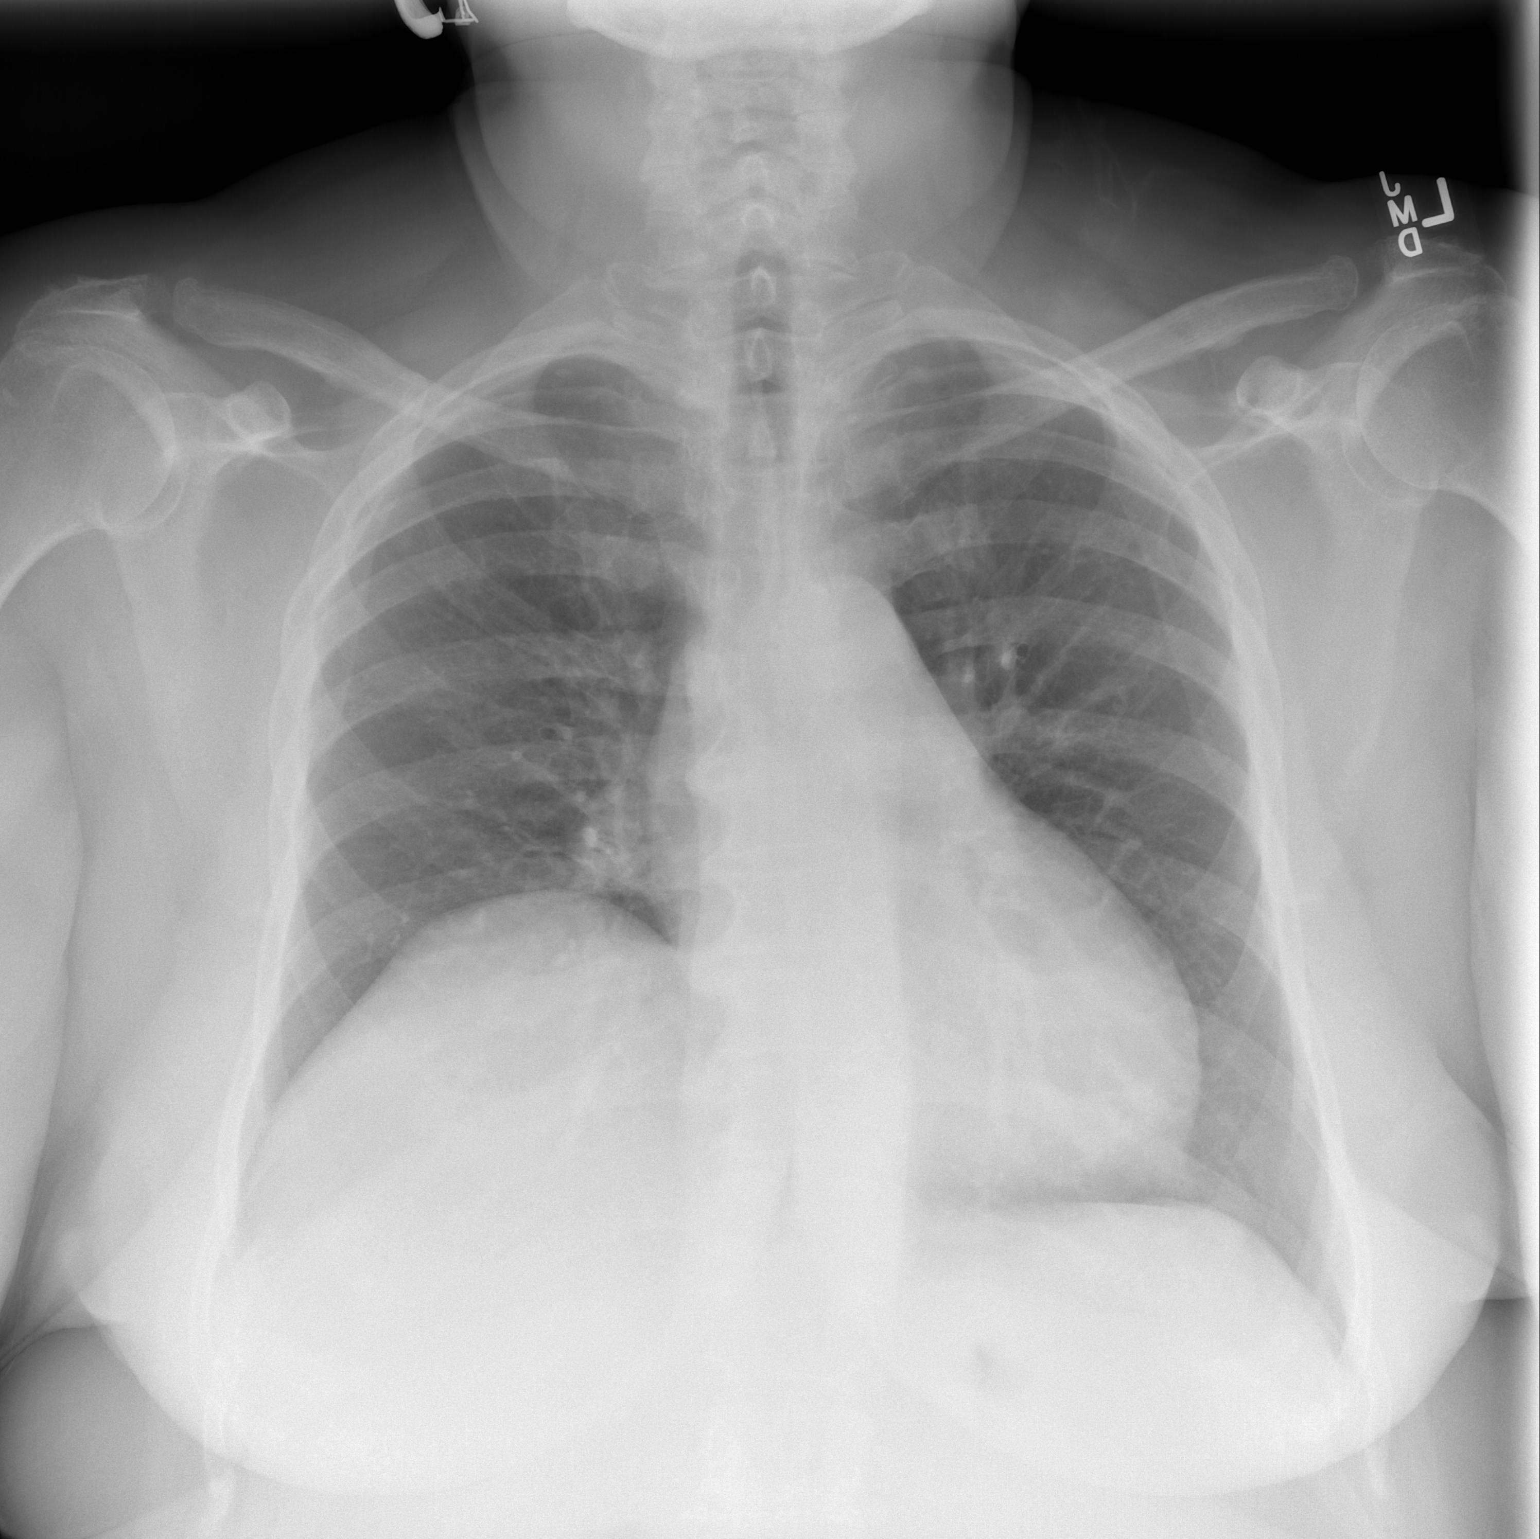

[w chest lat]
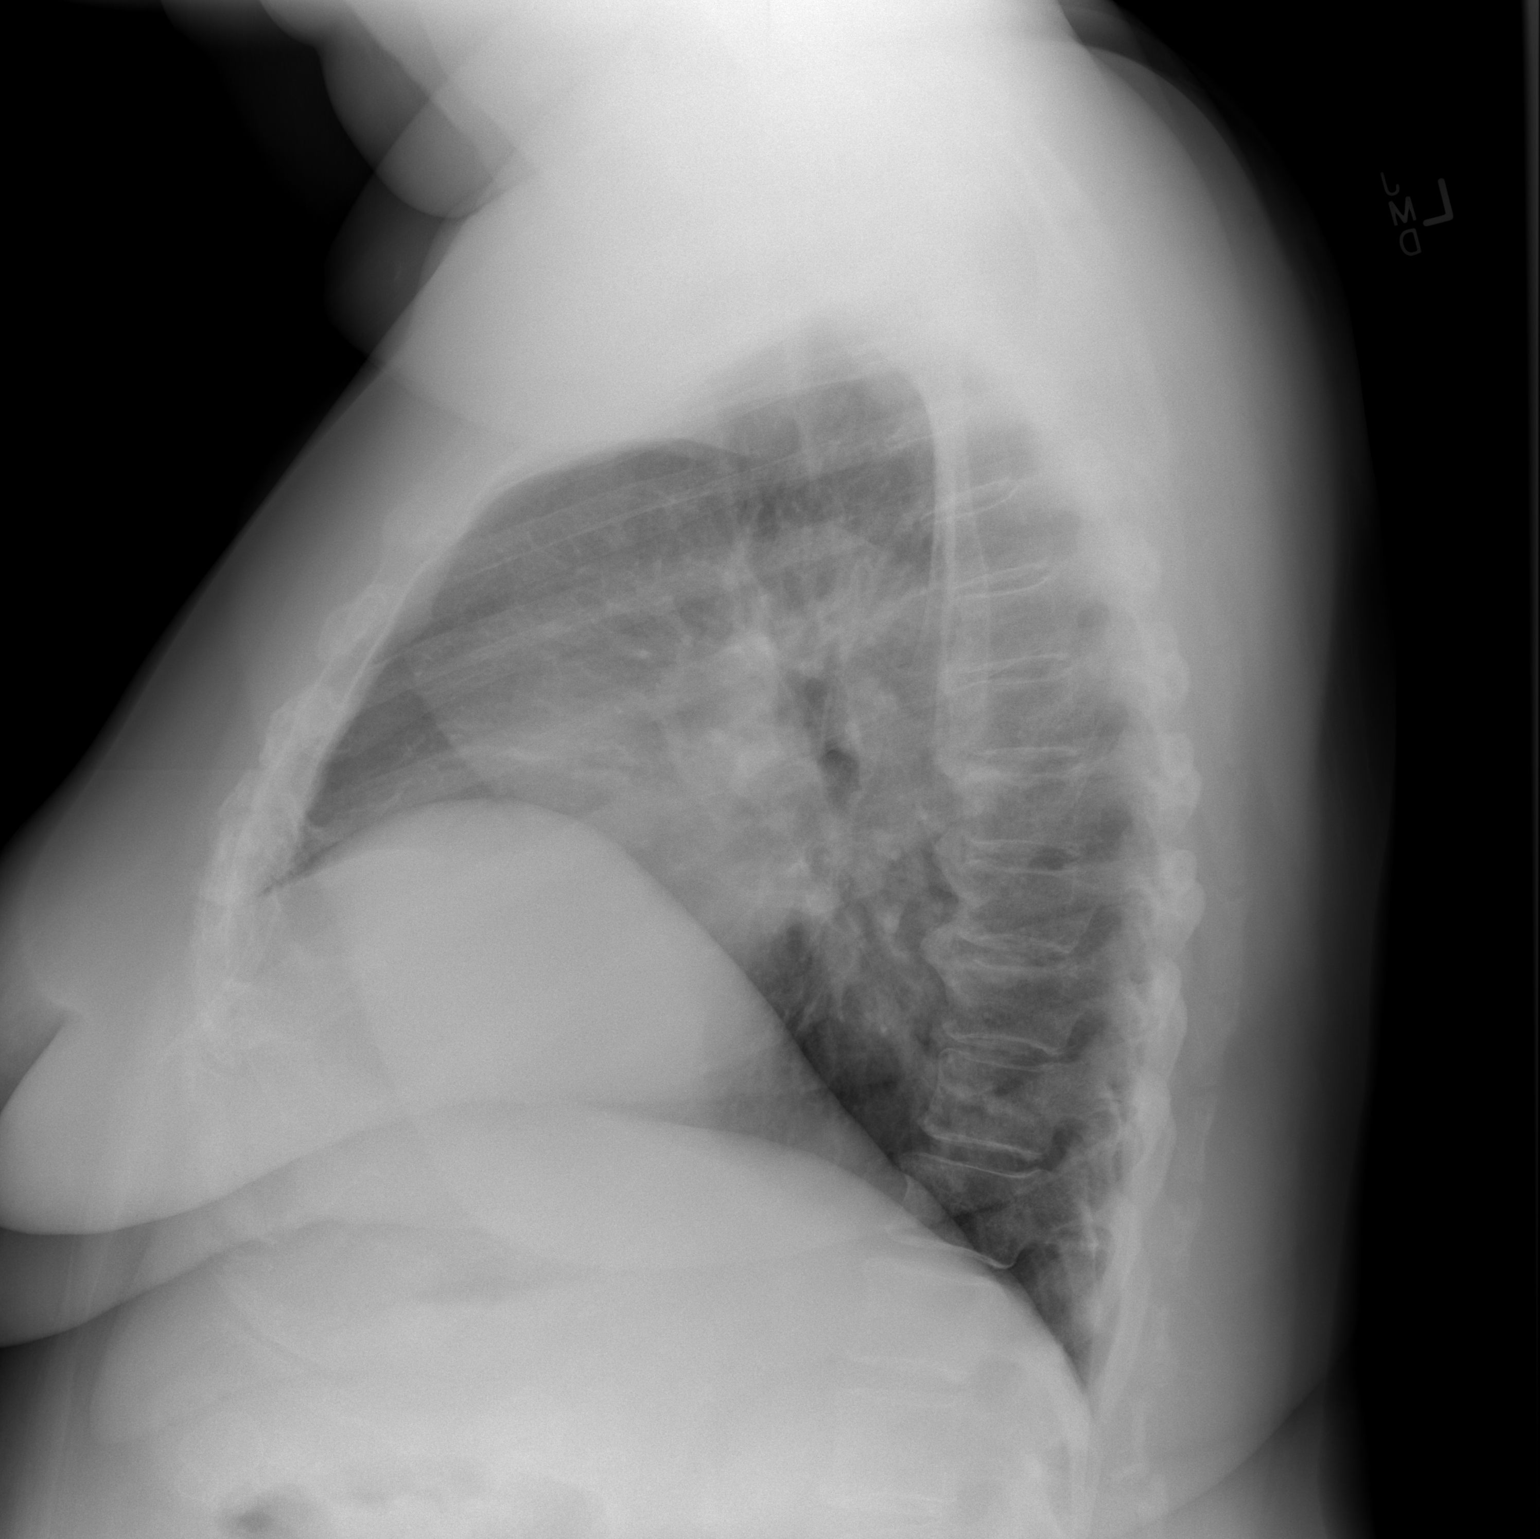

[2 of 2 positions shown; findings below may reference images not displayed]

FINDINGS: Mid thoracic spondylosis.

Midline trachea.  Borderline cardiomegaly.  Moderate right
hemidiaphragm elevation/eventration anteriorly. No pleural effusion
or pneumothorax.  No congestive failure.

Mildly low lung volumes. Clear lungs.
IMPRESSION: Borderline cardiomegaly with right hemidiaphragm elevation. No
acute findings.

## 2011-12-29 NOTE — Patient Instructions (Signed)
20 [name]  12/29/2011   Your procedure is scheduled on:  Friday 01/05/2012  Report to [facility] at 1115 AM.  Call this number if you have problems the morning of surgery: [phone]   Remember:   Do not eat food:After Midnight.  May have clear liquids:up to 6 Hours before arrival-up until 0715 am day of surgery  Clear liquids include soda, tea, black coffee, apple or grape juice, broth.  Take these medicines the morning of surgery with A SIP OF WATER: Pepcid   Do not wear jewelry, make-up or nail polish.  Do not wear lotions, powders, or perfumes.   Do not shave 48 hours prior to surgery.(women only-shaving legs)  Do not bring valuables to the hospital.  Contacts, dentures or bridgework may not be worn into surgery.  Leave suitcase in the car. After surgery it may be brought to your room.  For patients admitted to the hospital, checkout time is 11:00 AM the day of discharge.   Patients discharged the day of surgery will not be allowed to drive home.  Name and phone number of your driver:   Special Instructions: CHG Shower Use Special Wash: 1/2 bottle night before surgery and 1/2 bottle morning of surgery.   Please read over the following fact sheets that you were given: MRSA Information,blood transfusion sheet, apnea sheet,incentive              Spirometry sheet

## 2011-12-29 NOTE — Pre-Procedure Instructions (Signed)
Talked to [facility] at Dr. Deri Fuelling office to report abnormal CMET and U/A results-sent him message via email.

## 2011-12-29 NOTE — Progress Notes (Signed)
12/29/11 1250  OBSTRUCTIVE SLEEP APNEA  Have you ever been diagnosed with sleep apnea through a sleep study? No  Do you snore loudly (loud enough to be heard through closed doors)?  0  Do you often feel tired, fatigued, or sleepy during the daytime? 1  Has anyone observed you stop breathing during your sleep? 0  Do you have, or are you being treated for high blood pressure? 1  BMI more than 35 kg/m2? 1  Age over 69 years old? 1  Neck circumference greater than 40 cm/18 inches? 1  Gender: 0  Obstructive Sleep Apnea Score 5   Score 4 or greater  Updated health history

## 2012-01-02 ENCOUNTER — Other Ambulatory Visit: Payer: Self-pay | Admitting: Internal Medicine

## 2012-01-02 DIAGNOSIS — Z1231 Encounter for screening mammogram for malignant neoplasm of breast: Secondary | ICD-10-CM

## 2012-01-03 ENCOUNTER — Other Ambulatory Visit: Payer: Self-pay | Admitting: Orthopedic Surgery

## 2012-01-03 NOTE — H&P (Signed)
[name]  DOB: 07/25/1941 Married / Language: English / Race: Black or African American / Female  Date of Admission:  01/05/2012  Chief Complaint:  Right Knee Pain  History of Present Illness The patient is a 70 year old female who comes in for a preoperative History and Physical. The patient is scheduled for a right total knee arthroplasty to be performed by Dr. Frank V. Aluisio, MD at [facility] on 01/05/2012. The patient is a 70 year old female who presented for follow up of their knee. [name] has had problems with both knees for many years now. Historically the left knee had been worse but for the past year the right knee has been far worse. She has pain at all times. It limits what she can and cannot do. She is having difficulty sleeping at night. The right knee has been keeping her awake. She does get swelling at times. It wants to give out at times. She is not having the hip pain or lower extremity weakness or paresthesias. She saw Steve Chabon, PA-C and also saw Dr. Gioffre in the past. She has had cortisone and viscosupplementation in the right knee without benefit. Now she is ready to proceed with surgery. They have been treated conservatively in the past for the above stated problem and despite conservative measures, they continue to have progressive pain and severe functional limitations and dysfunction. They have failed non-operative management. It is felt that they would benefit from undergoing total joint replacement. Risks and benefits of the procedure have been discussed with the patient and they elect to proceed with surgery. There are no active contraindications to surgery such as ongoing infection or rapidly progressive neurological disease.  Allergies  No Known Drug Allergies Insect Stings - Bee Stings  Medication History  Pepcid AC (10MG Tablet, Oral) Active. B Complex ( Oral) Active. Aspirin Adult Low Strength (81MG Tablet DR, Oral)  Active. Atenolol/Chlorthalidone (50-25MG Tablet, Oral) Active. Amlodipine Besy-Benazepril HCl (5-20MG Capsule, Oral) Active. Trimethoprim (100MG Tablet, Oral) Active. Crestor (20MG Tablet, Oral) Active. Advil (200MG Tablet, Oral) Active.  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 Reason for Colon Resection Urinary Tract Infection Shingles  Past Surgical History  S/P partial resection of colon (V45.89) S/P partial hysterectomy (V88.02) Hx of oophorectomy (V45.77) Breast Duct Resection. Benign Skin Graft. Left Arm - Traumatic Compression Injury Left Upper Arm  Social History No alcohol use Tobacco use. Never smoker. Marital status. Married. Children. 6 Living situation. Lives with spouse. Post-Surgical Plans. Plan is to probably go home but may need to look into rehab.  Review of Systems General:Not Present- Chills, Fever, Night Sweats, Fatigue, Weight Gain, Weight Loss and Memory Loss. Skin:Not Present- Hives, Itching, Rash, Eczema and Lesions. HEENT:Not Present- Tinnitus, Headache, Double Vision, Visual Loss, Hearing Loss and Dentures. Respiratory:Present- Cough (mild with recent cold). Not Present- Shortness of breath with exertion, Shortness of breath at rest, Allergies, Coughing up blood and Chronic Cough. Cardiovascular:Present- Palpitations (Ocassional Plapitations). Not Present- Chest Pain, Racing/skipping heartbeats, Difficulty Breathing Lying Down, Murmur and Swelling. Gastrointestinal:Not Present- Bloody Stool, Heartburn, Abdominal Pain, Vomiting, Nausea, Constipation, Diarrhea, Difficulty Swallowing, Jaundice and Loss of appetitie. Female Genitourinary:Not Present- Blood in Urine, Urinary frequency, Weak urinary stream, Discharge, Flank Pain,  Incontinence, Painful Urination, Urgency, Urinary Retention and Urinating at Night. Musculoskeletal:Present- Joint Pain. Not Present- Muscle Weakness, Muscle Pain, Joint Swelling, Back Pain,   Morning Stiffness and Spasms. Neurological:Not Present- Tremor, Dizziness, Blackout spells, Paralysis, Difficulty with balance and Weakness. Psychiatric:Not Present- Insomnia.  Vitals Weight: 270 lb Height: 69 in Body Surface Area: 2.44 m Body Mass Index: 39.87 kg/m Pulse: 64 (Regular) Resp.: 14 (Unlabored) BP: 138/78 (Sitting, Right Arm, Standard)  Physical Exam  The physical exam findings are as follows: Patient is a 70 year old female with continued knee pain. Patient is accompanied today by her husband.  General Mental Status - Alert, cooperative and good historian. General Appearance- pleasant. Not in acute distress. Orientation- Oriented X3. Build & Nutrition- Overweight and Well developed.  Head and Neck Head- normocephalic, atraumatic . Neck Global Assessment- supple. no bruit auscultated on the right and no bruit auscultated on the left.  Eye Pupil- Bilateral- Regular and Round. Motion- Bilateral- EOMI.  Chest and Lung Exam Auscultation: Breath sounds:- clear at anterior chest wall and - clear at posterior chest wall. Adventitious sounds:- No Adventitious sounds.  Cardiovascular Auscultation:Rhythm- Regular rate and rhythm. Heart Sounds- S1 WNL and S2 WNL. Murmurs & Other Heart Sounds:Auscultation of the heart reveals - No Murmurs.  Abdomen Inspection:Contour- Generalized moderate distention. Palpation/Percussion:Tenderness- Abdomen is non-tender to palpation. Rigidity (guarding)- Abdomen is soft. Auscultation:Auscultation of the abdomen reveals - Bowel sounds normal.  Female Genitourinary  Not done, not pertinent to present illness  Musculoskeletal Very pleasant well-developed female, alert and oriented, in no apparent  distress. Evaluation of her hips show normal range of motion, no discomfort. Examination of the left knee shows no effusion. There is a varus deformity. Range of motion is five to 120 degrees. There is moderate crepitus on range of motion of the knee. There is no instability noted. Examination of the right knee, no effusion. Range of motion is five to 120 degrees with moderate crepitus on range of motion with tenderness medial greater than lateral. There is no instability noted. Pulses, sensation and motor are intact in both lower extremities.  RADIOGRAPHS: AP bilateral knees and lateral are reviewed. The patient had advanced endstage arthritis in both knees bone-on-bone in the medial compartment and patellofemoral compartment of both knees with subchondral sclerosis and varus deformities. She has some tibial subluxation in both.  Assessment & Plan Osteoarthritis Right Knee  Patient is for a Right Total Knee Replacement by Dr. Aluisio.  **Patient does give history of two times having difficulty waking up and breathig after two previous surgeries.**  PCP - Dr. John Jenkins - Patient has been seen preoperatively by Dr. Jenkins and felt to be stable for surgery.  Drew Jamond Neels, PA-C  

## 2012-01-05 ENCOUNTER — Encounter (HOSPITAL_COMMUNITY): Payer: Self-pay | Admitting: *Deleted

## 2012-01-05 ENCOUNTER — Inpatient Hospital Stay (HOSPITAL_COMMUNITY)
Admission: RE | Admit: 2012-01-05 | Discharge: 2012-01-08 | DRG: 470 | Disposition: A | Discharge status: Home Health | Payer: Medicare Other | Source: Ambulatory Visit | Attending: Orthopedic Surgery | Admitting: Orthopedic Surgery

## 2012-01-05 ENCOUNTER — Inpatient Hospital Stay (HOSPITAL_COMMUNITY): Payer: Medicare Other | Admitting: *Deleted

## 2012-01-05 ENCOUNTER — Encounter (HOSPITAL_COMMUNITY)
Admission: RE | Disposition: A | Discharge status: Home Health | Payer: Self-pay | Source: Ambulatory Visit | Attending: Orthopedic Surgery

## 2012-01-05 DIAGNOSIS — I10 Essential (primary) hypertension: Secondary | ICD-10-CM | POA: Diagnosis present

## 2012-01-05 DIAGNOSIS — E669 Obesity, unspecified: Secondary | ICD-10-CM | POA: Diagnosis present

## 2012-01-05 DIAGNOSIS — M1710 Unilateral primary osteoarthritis, unspecified knee: Principal | ICD-10-CM | POA: Diagnosis present

## 2012-01-05 DIAGNOSIS — Z6841 Body Mass Index (BMI) 40.0 and over, adult: Secondary | ICD-10-CM

## 2012-01-05 DIAGNOSIS — M179 Osteoarthritis of knee, unspecified: Secondary | ICD-10-CM | POA: Diagnosis present

## 2012-01-05 DIAGNOSIS — K219 Gastro-esophageal reflux disease without esophagitis: Secondary | ICD-10-CM | POA: Diagnosis present

## 2012-01-05 DIAGNOSIS — Z96659 Presence of unspecified artificial knee joint: Secondary | ICD-10-CM

## 2012-01-05 HISTORY — PX: TOTAL KNEE ARTHROPLASTY: SHX125

## 2012-01-05 LAB — TYPE AND SCREEN
ABO/RH(D): A POS
Antibody Screen: NEGATIVE

## 2012-01-05 LAB — GLUCOSE, CAPILLARY: Glucose-Capillary: 177 mg/dL — ABNORMAL HIGH (ref 70–99)

## 2012-01-05 SURGERY — ARTHROPLASTY, KNEE, TOTAL
Anesthesia: Spinal | Site: Knee | Laterality: Right | Wound class: Clean

## 2012-01-05 MED ORDER — DOCUSATE SODIUM 100 MG PO CAPS
100.0000 mg | ORAL_CAPSULE | Freq: Two times a day (BID) | ORAL | Status: DC
  Administered 2012-01-05 – 2012-01-08 (×6): 100 mg via ORAL
  Filled 2012-01-05 (×5): qty 1

## 2012-01-05 MED ORDER — METHOCARBAMOL 100 MG/ML IJ SOLN
500.0000 mg | Freq: Four times a day (QID) | INTRAVENOUS | Status: DC | PRN
  Administered 2012-01-05: 500 mg via INTRAVENOUS
  Filled 2012-01-05 (×2): qty 5

## 2012-01-05 MED ORDER — ONDANSETRON HCL 4 MG PO TABS: 4.0000 mg | ORAL_TABLET | Freq: Four times a day (QID) | ORAL | Status: DC | PRN

## 2012-01-05 MED ORDER — LIDOCAINE HCL (CARDIAC) 20 MG/ML IV SOLN
INTRAVENOUS | Status: DC | PRN
  Administered 2012-01-05: 50 mg via INTRAVENOUS

## 2012-01-05 MED ORDER — EPHEDRINE SULFATE 50 MG/ML IJ SOLN
INTRAMUSCULAR | Status: DC | PRN
  Administered 2012-01-05 (×2): 10 mg via INTRAVENOUS
  Administered 2012-01-05: 5 mg via INTRAVENOUS
  Administered 2012-01-05: 10 mg via INTRAVENOUS

## 2012-01-05 MED ORDER — ATENOLOL 50 MG PO TABS
50.0000 mg | ORAL_TABLET | Freq: Every day | ORAL | Status: DC
  Administered 2012-01-06 – 2012-01-07 (×2): 50 mg via ORAL
  Filled 2012-01-05 (×3): qty 1

## 2012-01-05 MED ORDER — BUPIVACAINE 0.25 % ON-Q PUMP SINGLE CATH 300ML
INJECTION | Status: AC
  Filled 2012-01-05: qty 300

## 2012-01-05 MED ORDER — AMLODIPINE BESYLATE 5 MG PO TABS
5.0000 mg | ORAL_TABLET | Freq: Every day | ORAL | Status: DC
  Administered 2012-01-05: 5 mg via ORAL
  Filled 2012-01-05: qty 1

## 2012-01-05 MED ORDER — METHOCARBAMOL 500 MG PO TABS
500.0000 mg | ORAL_TABLET | Freq: Four times a day (QID) | ORAL | Status: DC | PRN
  Administered 2012-01-06: 500 mg via ORAL
  Filled 2012-01-05: qty 1

## 2012-01-05 MED ORDER — TEMAZEPAM 15 MG PO CAPS: 15.0000 mg | ORAL_CAPSULE | Freq: Every evening | ORAL | Status: DC | PRN

## 2012-01-05 MED ORDER — BUPIVACAINE IN DEXTROSE 0.75-8.25 % IT SOLN
INTRATHECAL | Status: DC | PRN
Start: 2012-01-05 — End: 2012-01-05
  Administered 2012-01-05: 2 mL via INTRATHECAL

## 2012-01-05 MED ORDER — FAMOTIDINE 20 MG PO TABS
20.0000 mg | ORAL_TABLET | Freq: Every day | ORAL | Status: DC
  Administered 2012-01-05 – 2012-01-08 (×4): 20 mg via ORAL
  Filled 2012-01-05 (×4): qty 1

## 2012-01-05 MED ORDER — AMLODIPINE BESY-BENAZEPRIL HCL 5-20 MG PO CAPS: 1.0000 | ORAL_CAPSULE | Freq: Every day | ORAL | Status: DC

## 2012-01-05 MED ORDER — METOCLOPRAMIDE HCL 10 MG PO TABS: 5.0000 mg | ORAL_TABLET | Freq: Three times a day (TID) | ORAL | Status: DC | PRN

## 2012-01-05 MED ORDER — ATENOLOL-CHLORTHALIDONE 50-25 MG PO TABS: 1.0000 | ORAL_TABLET | Freq: Every day | ORAL | Status: DC

## 2012-01-05 MED ORDER — BENAZEPRIL HCL 20 MG PO TABS
20.0000 mg | ORAL_TABLET | Freq: Every day | ORAL | Status: DC
  Administered 2012-01-06 – 2012-01-07 (×2): 20 mg via ORAL
  Filled 2012-01-05 (×3): qty 1

## 2012-01-05 MED ORDER — HYDRALAZINE HCL 20 MG/ML IJ SOLN
10.0000 mg | Freq: Four times a day (QID) | INTRAMUSCULAR | Status: DC | PRN
  Administered 2012-01-06: 10 mg via INTRAVENOUS
  Filled 2012-01-05: qty 1

## 2012-01-05 MED ORDER — AMLODIPINE BESYLATE 5 MG PO TABS
5.0000 mg | ORAL_TABLET | Freq: Every day | ORAL | Status: DC
  Administered 2012-01-06 – 2012-01-07 (×2): 5 mg via ORAL
  Filled 2012-01-05 (×3): qty 1

## 2012-01-05 MED ORDER — METOCLOPRAMIDE HCL 5 MG/ML IJ SOLN: 5.0000 mg | Freq: Three times a day (TID) | INTRAMUSCULAR | Status: DC | PRN

## 2012-01-05 MED ORDER — RIVAROXABAN 10 MG PO TABS
10.0000 mg | ORAL_TABLET | Freq: Every day | ORAL | Status: DC
  Administered 2012-01-06 – 2012-01-08 (×3): 10 mg via ORAL
  Filled 2012-01-05 (×3): qty 1

## 2012-01-05 MED ORDER — DIPHENHYDRAMINE HCL 12.5 MG/5ML PO ELIX: 12.5000 mg | ORAL_SOLUTION | ORAL | Status: DC | PRN

## 2012-01-05 MED ORDER — NALOXONE HCL 0.4 MG/ML IJ SOLN: 0.4000 mg | INTRAMUSCULAR | Status: DC | PRN

## 2012-01-05 MED ORDER — ACETAMINOPHEN 10 MG/ML IV SOLN
1000.0000 mg | Freq: Once | INTRAVENOUS | Status: AC
  Administered 2012-01-05: 1000 mg via INTRAVENOUS

## 2012-01-05 MED ORDER — ALLOPURINOL 100 MG PO TABS: 100.0000 mg | ORAL_TABLET | Freq: Every day | ORAL | Status: DC | PRN

## 2012-01-05 MED ORDER — BISACODYL 10 MG RE SUPP: 10.0000 mg | Freq: Every day | RECTAL | Status: DC | PRN

## 2012-01-05 MED ORDER — MIDAZOLAM HCL 5 MG/5ML IJ SOLN
INTRAMUSCULAR | Status: DC | PRN
  Administered 2012-01-05: 2 mg via INTRAVENOUS

## 2012-01-05 MED ORDER — ACETAMINOPHEN 325 MG PO TABS: 650.0000 mg | ORAL_TABLET | Freq: Four times a day (QID) | ORAL | Status: DC | PRN

## 2012-01-05 MED ORDER — SODIUM CHLORIDE 0.9 % IV SOLN: INTRAVENOUS | Status: DC

## 2012-01-05 MED ORDER — MENTHOL 3 MG MT LOZG: 1.0000 | LOZENGE | OROMUCOSAL | Status: DC | PRN

## 2012-01-05 MED ORDER — SODIUM CHLORIDE 0.9 % IV SOLN
INTRAVENOUS | Status: DC
  Administered 2012-01-05: 75 mL/h via INTRAVENOUS
  Administered 2012-01-07: 18:00:00 via INTRAVENOUS

## 2012-01-05 MED ORDER — SODIUM CHLORIDE 0.9 % IJ SOLN: 9.0000 mL | INTRAMUSCULAR | Status: DC | PRN

## 2012-01-05 MED ORDER — CEFAZOLIN SODIUM-DEXTROSE 2-3 GM-% IV SOLR
2.0000 g | INTRAVENOUS | Status: AC
  Administered 2012-01-05: 2 g via INTRAVENOUS

## 2012-01-05 MED ORDER — DIPHENHYDRAMINE HCL 50 MG/ML IJ SOLN: 12.5000 mg | Freq: Four times a day (QID) | INTRAMUSCULAR | Status: DC | PRN

## 2012-01-05 MED ORDER — CEFAZOLIN SODIUM 1-5 GM-% IV SOLN
1.0000 g | Freq: Four times a day (QID) | INTRAVENOUS | Status: AC
  Administered 2012-01-05 – 2012-01-06 (×3): 1 g via INTRAVENOUS
  Filled 2012-01-05 (×3): qty 50

## 2012-01-05 MED ORDER — ACETAMINOPHEN 650 MG RE SUPP: 650.0000 mg | Freq: Four times a day (QID) | RECTAL | Status: DC | PRN

## 2012-01-05 MED ORDER — PHENOL 1.4 % MT LIQD: 1.0000 | OROMUCOSAL | Status: DC | PRN

## 2012-01-05 MED ORDER — DIPHENHYDRAMINE HCL 12.5 MG/5ML PO ELIX: 12.5000 mg | ORAL_SOLUTION | Freq: Four times a day (QID) | ORAL | Status: DC | PRN

## 2012-01-05 MED ORDER — BUPIVACAINE 0.25 % ON-Q PUMP SINGLE CATH 300ML: 300.0000 mL | INJECTION | Status: DC

## 2012-01-05 MED ORDER — ACETAMINOPHEN 10 MG/ML IV SOLN
1000.0000 mg | Freq: Four times a day (QID) | INTRAVENOUS | Status: AC
  Administered 2012-01-05 – 2012-01-06 (×3): 1000 mg via INTRAVENOUS
  Filled 2012-01-05 (×3): qty 100

## 2012-01-05 MED ORDER — MORPHINE SULFATE (PF) 1 MG/ML IV SOLN
INTRAVENOUS | Status: DC
  Administered 2012-01-05: 16:00:00 via INTRAVENOUS
  Administered 2012-01-05: 13 mg via INTRAVENOUS
  Administered 2012-01-06: 1 mg via INTRAVENOUS

## 2012-01-05 MED ORDER — BUPIVACAINE 0.25 % ON-Q PUMP SINGLE CATH 300ML
INJECTION | Status: DC | PRN
  Administered 2012-01-05: 300 mL

## 2012-01-05 MED ORDER — LACTATED RINGERS IV SOLN
INTRAVENOUS | Status: DC | PRN
  Administered 2012-01-05: 14:00:00 via INTRAVENOUS

## 2012-01-05 MED ORDER — ONDANSETRON HCL 4 MG/2ML IJ SOLN: 4.0000 mg | Freq: Four times a day (QID) | INTRAMUSCULAR | Status: DC | PRN

## 2012-01-05 MED ORDER — FLEET ENEMA 7-19 GM/118ML RE ENEM: 1.0000 | ENEMA | Freq: Once | RECTAL | Status: AC | PRN

## 2012-01-05 MED ORDER — BUPIVACAINE ON-Q PAIN PUMP (FOR ORDER SET NO CHG)
INJECTION | Status: DC
  Filled 2012-01-05: qty 1

## 2012-01-05 MED ORDER — HYDROMORPHONE HCL PF 1 MG/ML IJ SOLN: 0.2500 mg | INTRAMUSCULAR | Status: DC | PRN

## 2012-01-05 MED ORDER — LACTATED RINGERS IV SOLN
INTRAVENOUS | Status: DC
  Administered 2012-01-05: 1000 mL via INTRAVENOUS

## 2012-01-05 MED ORDER — POLYETHYLENE GLYCOL 3350 17 G PO PACK: 17.0000 g | PACK | Freq: Every day | ORAL | Status: DC | PRN

## 2012-01-05 MED ORDER — ONDANSETRON HCL 4 MG/2ML IJ SOLN
INTRAMUSCULAR | Status: DC | PRN
  Administered 2012-01-05: 4 mg via INTRAVENOUS

## 2012-01-05 MED ORDER — PROPOFOL 10 MG/ML IV EMUL
INTRAVENOUS | Status: DC | PRN
  Administered 2012-01-05: 25 ug/kg/min via INTRAVENOUS

## 2012-01-05 MED ORDER — FENTANYL CITRATE 0.05 MG/ML IJ SOLN
INTRAMUSCULAR | Status: DC | PRN
  Administered 2012-01-05: 50 ug via INTRAVENOUS

## 2012-01-05 MED ORDER — ATENOLOL 50 MG PO TABS
50.0000 mg | ORAL_TABLET | Freq: Once | ORAL | Status: AC
  Administered 2012-01-05: 50 mg via ORAL
  Filled 2012-01-05: qty 1

## 2012-01-05 MED ORDER — SODIUM CHLORIDE 0.9 % IR SOLN
Status: DC | PRN
  Administered 2012-01-05: 1000 mL

## 2012-01-05 MED ORDER — DEXAMETHASONE SODIUM PHOSPHATE 10 MG/ML IJ SOLN: 10.0000 mg | Freq: Once | INTRAMUSCULAR | Status: DC

## 2012-01-05 MED ORDER — ONDANSETRON HCL 4 MG/2ML IJ SOLN
4.0000 mg | Freq: Four times a day (QID) | INTRAMUSCULAR | Status: DC | PRN
  Filled 2012-01-05: qty 2

## 2012-01-05 MED ORDER — CHLORTHALIDONE 25 MG PO TABS
25.0000 mg | ORAL_TABLET | Freq: Every day | ORAL | Status: DC
  Administered 2012-01-06 – 2012-01-07 (×2): 25 mg via ORAL
  Filled 2012-01-05 (×3): qty 1

## 2012-01-05 MED ORDER — MORPHINE SULFATE (PF) 1 MG/ML IV SOLN
INTRAVENOUS | Status: AC
  Filled 2012-01-05: qty 25

## 2012-01-05 MED ORDER — OXYCODONE HCL 5 MG PO TABS
5.0000 mg | ORAL_TABLET | ORAL | Status: DC | PRN
  Administered 2012-01-06: 5 mg via ORAL
  Administered 2012-01-06 – 2012-01-07 (×3): 10 mg via ORAL
  Filled 2012-01-05: qty 1
  Filled 2012-01-05 (×3): qty 2

## 2012-01-05 SURGICAL SUPPLY — 55 items
BAG SPEC THK2 15X12 ZIP CLS (MISCELLANEOUS) ×1
BAG ZIPLOCK 12X15 (MISCELLANEOUS) ×2 IMPLANT
BANDAGE ELASTIC 6 VELCRO ST LF (GAUZE/BANDAGES/DRESSINGS) ×2 IMPLANT
BANDAGE ESMARK 6X9 LF (GAUZE/BANDAGES/DRESSINGS) ×1 IMPLANT
BLADE SAG 18X100X1.27 (BLADE) ×2 IMPLANT
BLADE SAW SGTL 11.0X1.19X90.0M (BLADE) ×2 IMPLANT
BNDG CMPR 9X6 STRL LF SNTH (GAUZE/BANDAGES/DRESSINGS) ×1
BNDG ESMARK 6X9 LF (GAUZE/BANDAGES/DRESSINGS) ×2
BOWL SMART MIX CTS (DISPOSABLE) ×2 IMPLANT
CATH KIT ON-Q SILVERSOAK 5 (CATHETERS) ×1 IMPLANT
CATH KIT ON-Q SILVERSOAK 5IN (CATHETERS) ×2 IMPLANT
CEMENT HV SMART SET (Cement) ×4 IMPLANT
CLOSURE STERI STRIP 1/2 X4 (GAUZE/BANDAGES/DRESSINGS) ×1 IMPLANT
CLOTH BEACON ORANGE TIMEOUT ST (SAFETY) ×2 IMPLANT
CUFF TOURN SGL QUICK 34 (TOURNIQUET CUFF) ×2
CUFF TRNQT CYL 34X4X40X1 (TOURNIQUET CUFF) ×1 IMPLANT
DRAPE EXTREMITY T 121X128X90 (DRAPE) ×2 IMPLANT
DRAPE POUCH INSTRU U-SHP 10X18 (DRAPES) ×2 IMPLANT
DRAPE U-SHAPE 47X51 STRL (DRAPES) ×2 IMPLANT
DRSG ADAPTIC 3X8 NADH LF (GAUZE/BANDAGES/DRESSINGS) ×2 IMPLANT
DRSG PAD ABDOMINAL 8X10 ST (GAUZE/BANDAGES/DRESSINGS) ×1 IMPLANT
DURAPREP 26ML APPLICATOR (WOUND CARE) ×2 IMPLANT
ELECT REM PT RETURN 9FT ADLT (ELECTROSURGICAL) ×2
ELECTRODE REM PT RTRN 9FT ADLT (ELECTROSURGICAL) ×1 IMPLANT
EVACUATOR 1/8 PVC DRAIN (DRAIN) ×2 IMPLANT
FACESHIELD LNG OPTICON STERILE (SAFETY) ×10 IMPLANT
GLOVE BIO SURGEON STRL SZ7.5 (GLOVE) ×2 IMPLANT
GLOVE BIO SURGEON STRL SZ8 (GLOVE) ×2 IMPLANT
GLOVE BIOGEL PI IND STRL 8 (GLOVE) ×2 IMPLANT
GLOVE BIOGEL PI INDICATOR 8 (GLOVE) ×2
GOWN STRL NON-REIN LRG LVL3 (GOWN DISPOSABLE) ×2 IMPLANT
GOWN STRL REIN XL XLG (GOWN DISPOSABLE) ×2 IMPLANT
HANDPIECE INTERPULSE COAX TIP (DISPOSABLE) ×2
IMMOBILIZER KNEE 20 (SOFTGOODS) ×2
IMMOBILIZER KNEE 20 THIGH 36 (SOFTGOODS) ×1 IMPLANT
KIT BASIN OR (CUSTOM PROCEDURE TRAY) ×2 IMPLANT
MANIFOLD NEPTUNE II (INSTRUMENTS) ×2 IMPLANT
NS IRRIG 1000ML POUR BTL (IV SOLUTION) ×2 IMPLANT
PACK TOTAL JOINT (CUSTOM PROCEDURE TRAY) ×2 IMPLANT
PAD ABD 7.5X8 STRL (GAUZE/BANDAGES/DRESSINGS) ×2 IMPLANT
PADDING CAST COTTON 6X4 STRL (CAST SUPPLIES) ×6 IMPLANT
PADDING WEBRIL 6 STERILE (GAUZE/BANDAGES/DRESSINGS) ×1 IMPLANT
POSITIONER SURGICAL ARM (MISCELLANEOUS) ×2 IMPLANT
SET HNDPC FAN SPRY TIP SCT (DISPOSABLE) ×1 IMPLANT
SPONGE GAUZE 4X4 12PLY (GAUZE/BANDAGES/DRESSINGS) ×2 IMPLANT
STRIP CLOSURE SKIN 1/2X4 (GAUZE/BANDAGES/DRESSINGS) ×4 IMPLANT
SUCTION FRAZIER 12FR DISP (SUCTIONS) ×2 IMPLANT
SUT MNCRL AB 4-0 PS2 18 (SUTURE) ×2 IMPLANT
SUT PDS AB 1 CT1 27 (SUTURE) ×6 IMPLANT
SUT VIC AB 2-0 CT1 27 (SUTURE) ×6
SUT VIC AB 2-0 CT1 TAPERPNT 27 (SUTURE) ×3 IMPLANT
TOWEL OR 17X26 10 PK STRL BLUE (TOWEL DISPOSABLE) ×4 IMPLANT
TRAY FOLEY CATH 14FRSI W/METER (CATHETERS) ×2 IMPLANT
WATER STERILE IRR 1500ML POUR (IV SOLUTION) ×2 IMPLANT
WRAP KNEE MAXI GEL POST OP (GAUZE/BANDAGES/DRESSINGS) ×4 IMPLANT

## 2012-01-05 NOTE — Progress Notes (Signed)
01/05/12 0933  OBSTRUCTIVE SLEEP APNEA  Score 4 or greater  Updated health history;Results sent to PCP

## 2012-01-05 NOTE — H&P (View-Only) (Signed)
[name]  DOB: Dec 22, 1940 Married / Language: English / Race: Black or African American / Female  Date of Admission:  01/05/2012  Chief Complaint:  Right Knee Pain  History of Present Illness The patient is a 71 year old female who comes in for a preoperative History and Physical. The patient is scheduled for a right total knee arthroplasty to be performed by Dr. Gus Rankin. Aluisio, MD at [facility] on 01/05/2012. The patient is a 71 year old female who presented for follow up of their knee. Andelyn Spade has had problems with both knees for many years now. Historically the left knee had been worse but for the past year the right knee has been far worse. She has pain at all times. It limits what she can and cannot do. She is having difficulty sleeping at night. The right knee has been keeping her awake. She does get swelling at times. It wants to give out at times. She is not having the hip pain or lower extremity weakness or paresthesias. She saw Leilani Able, PA-C and also saw Dr. Darrelyn Hillock in the past. She has had cortisone and viscosupplementation in the right knee without benefit. Now she is ready to proceed with surgery. They have been treated conservatively in the past for the above stated problem and despite conservative measures, they continue to have progressive pain and severe functional limitations and dysfunction. They have failed non-operative management. It is felt that they would benefit from undergoing total joint replacement. Risks and benefits of the procedure have been discussed with the patient and they elect to proceed with surgery. There are no active contraindications to surgery such as ongoing infection or rapidly progressive neurological disease.  Allergies  No Known Drug Allergies Insect Stings - Bee Stings  Medication History  Pepcid AC (10MG  Tablet, Oral) Active. B Complex ( Oral) Active. Aspirin Adult Low Strength (81MG  Tablet DR, Oral)  Active. Atenolol/Chlorthalidone (50-25MG  Tablet, Oral) Active. Amlodipine Besy-Benazepril HCl (5-20MG  Capsule, Oral) Active. Trimethoprim (100MG  Tablet, Oral) Active. Crestor (20MG  Tablet, Oral) Active. Advil (200MG  Tablet, Oral) Active.  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 Reason for Colon Resection Urinary Tract Infection Shingles  Past Surgical History  S/P partial resection of colon (V45.89) S/P partial hysterectomy (V88.02) Hx of oophorectomy (V45.77) Breast Duct Resection. Benign Skin Graft. Left Arm - Traumatic Compression Injury Left Upper Arm  Social History No alcohol use Tobacco use. Never smoker. Marital status. Married. Children. 6 Living situation. Lives with spouse. Post-Surgical Plans. Plan is to probably go home but may need to look into rehab.  Review of Systems General:Not Present- Chills, Fever, Night Sweats, Fatigue, Weight Gain, Weight Loss and Memory Loss. Skin:Not Present- Hives, Itching, Rash, Eczema and Lesions. HEENT:Not Present- Tinnitus, Headache, Double Vision, Visual Loss, Hearing Loss and Dentures. Respiratory:Present- Cough (mild with recent cold). Not Present- Shortness of breath with exertion, Shortness of breath at rest, Allergies, Coughing up blood and Chronic Cough. Cardiovascular:Present- Palpitations (Ocassional Plapitations). Not Present- Chest Pain, Racing/skipping heartbeats, Difficulty Breathing Lying Down, Murmur and Swelling. Gastrointestinal:Not Present- Bloody Stool, Heartburn, Abdominal Pain, Vomiting, Nausea, Constipation, Diarrhea, Difficulty Swallowing, Jaundice and Loss of appetitie. Female Genitourinary:Not Present- Blood in Urine, Urinary frequency, Weak urinary stream, Discharge, Flank Pain,  Incontinence, Painful Urination, Urgency, Urinary Retention and Urinating at Night. Musculoskeletal:Present- Joint Pain. Not Present- Muscle Weakness, Muscle Pain, Joint Swelling, Back Pain,  Morning Stiffness and Spasms. Neurological:Not Present- Tremor, Dizziness, Blackout spells, Paralysis, Difficulty with balance and Weakness. Psychiatric:Not Present- Insomnia.  Vitals Weight: 270 lb Height: 69 in Body Surface Area: 2.44 m Body Mass Index: 39.87 kg/m Pulse: 64 (Regular) Resp.: 14 (Unlabored) BP: 138/78 (Sitting, Right Arm, Standard)  Physical Exam  The physical exam findings are as follows: Patient is a 71 year old female with continued knee pain. Patient is accompanied today by her husband.  General Mental Status - Alert, cooperative and good historian. General Appearance- pleasant. Not in acute distress. Orientation- Oriented X3. Build & Nutrition- Overweight and Well developed.  Head and Neck Head- normocephalic, atraumatic . Neck Global Assessment- supple. no bruit auscultated on the right and no bruit auscultated on the left.  Eye Pupil- Bilateral- Regular and Round. Motion- Bilateral- EOMI.  Chest and Lung Exam Auscultation: Breath sounds:- clear at anterior chest wall and - clear at posterior chest wall. Adventitious sounds:- No Adventitious sounds.  Cardiovascular Auscultation:Rhythm- Regular rate and rhythm. Heart Sounds- S1 WNL and S2 WNL. Murmurs & Other Heart Sounds:Auscultation of the heart reveals - No Murmurs.  Abdomen Inspection:Contour- Generalized moderate distention. Palpation/Percussion:Tenderness- Abdomen is non-tender to palpation. Rigidity (guarding)- Abdomen is soft. Auscultation:Auscultation of the abdomen reveals - Bowel sounds normal.  Female Genitourinary  Not done, not pertinent to present illness  Musculoskeletal Very pleasant well-developed female, alert and oriented, in no apparent  distress. Evaluation of her hips show normal range of motion, no discomfort. Examination of the left knee shows no effusion. There is a varus deformity. Range of motion is five to 120 degrees. There is moderate crepitus on range of motion of the knee. There is no instability noted. Examination of the right knee, no effusion. Range of motion is five to 120 degrees with moderate crepitus on range of motion with tenderness medial greater than lateral. There is no instability noted. Pulses, sensation and motor are intact in both lower extremities.  RADIOGRAPHS: AP bilateral knees and lateral are reviewed. The patient had advanced endstage arthritis in both knees bone-on-bone in the medial compartment and patellofemoral compartment of both knees with subchondral sclerosis and varus deformities. She has some tibial subluxation in both.  Assessment & Plan Osteoarthritis Right Knee  Patient is for a Right Total Knee Replacement by Dr. Lequita Halt.  **Patient does give history of two times having difficulty waking up and breathig after two previous surgeries.**  PCP - Dr. [name] - Patient has been seen preoperatively by Dr. Lovell Sheehan and felt to be stable for surgery.  Avel Peace, PA-C

## 2012-01-05 NOTE — Transfer of Care (Signed)
Immediate Anesthesia Transfer of Care Note  Patient: [name]  Procedure(s) Performed: Procedure(s) (LRB): TOTAL KNEE ARTHROPLASTY (Right)  Patient Location: PACU  Anesthesia Type: Spinal  Level of Consciousness: sedated, patient cooperative and responds to stimulaton  Airway & Oxygen Therapy: Patient Spontanous Breathing and Patient connected to face mask oxgen  Post-op Assessment: Report given to PACU RN and Post -op Vital signs reviewed and stable  Post vital signs: Reviewed and stable  Complications: No apparent anesthesia complications

## 2012-01-05 NOTE — Interval H&P Note (Signed)
History and Physical Interval Note:  01/05/2012 1:26 PM  [name]  has presented today for surgery, with the diagnosis of Osteoarthritis of the Right knee  The various methods of treatment have been discussed with the patient and family. After consideration of risks, benefits and other options for treatment, the patient has consented to  Procedure(s) (LRB): TOTAL KNEE ARTHROPLASTY (Right) as a surgical intervention .  The patients' history has been reviewed, patient examined, no change in status, stable for surgery.  I have reviewed the patients' chart and labs.  Questions were answered to the patient's satisfaction.     Loanne Drilling

## 2012-01-05 NOTE — Op Note (Signed)
Pre-operative diagnosis- Osteoarthritis  Right knee(s)  Post-operative diagnosis- Osteoarthritis Right knee(s)  Procedure-  Right  Total Knee Arthroplasty  Surgeon- [name]. Cheryl Victoria, MD  Assistant- Avel Peace, PA-C   Anesthesia-  Spinal EBL-* No blood loss amount entered *  Drains Hemovac  Tourniquet time-  Total Tourniquet Time Documented: Thigh (Right) - -139 minutes   Complications- None  Condition-PACU - hemodynamically stable.   Brief Clinical Note  [name] is a 71 y.o. year old female with end stage OA of her right knee with progressively worsening pain and dysfunction. She has constant pain, with activity and at rest and significant functional deficits with difficulties even with ADLs. She has had extensive non-op management including analgesics, injections of cortisone and viscosupplements, and home exercise program, but remains in significant pain with significant dysfunction.Radiographs show bone on bone arthritis medial and patellofemoral with varus deformity and tibial subluxation. She presents now for left Total Knee Arthroplasty.    Procedure in detail---   The patient is brought into the operating room and positioned supine on the operating table. After successful administration of  Spinal,   a tourniquet is placed high on the  Right thigh(s) and the lower extremity is prepped and draped in the usual sterile fashion. Time out is performed by the operating team and then the  Right lower extremity is wrapped in Esmarch, knee flexed and the tourniquet inflated to 300 mmHg.       A midline incision is made with a ten blade through the subcutaneous tissue to the level of the extensor mechanism. A fresh blade is used to make a medial parapatellar arthrotomy. Soft tissue over the proximal medial tibia is subperiosteally elevated to the joint line with a knife and into the semimembranosus bursa with a Cobb elevator. Soft tissue over the proximal lateral tibia is elevated  with attention being paid to avoiding the patellar tendon on the tibial tubercle. The patella is everted, knee flexed 90 degrees and the ACL and PCL are removed. Findings are bone on bone medial and patellofemoral with large tricompartmental osteophytes.        The drill is used to create a starting hole in the distal femur and the canal is thoroughly irrigated with sterile saline to remove the fatty contents. The 5 degree Right  valgus alignment guide is placed into the femoral canal and the distal femoral cutting block is pinned to remove 11 mm off the distal femur. Resection is made with an oscillating saw.      The tibia is subluxed forward and the menisci are removed. The extramedullary alignment guide is placed referencing proximally at the medial aspect of the tibial tubercle and distally along the second metatarsal axis and tibial crest. The block is pinned to remove 2mm off the more deficient medial  side. Resection is made with an oscillating saw. Size 3is the most appropriate size for the tibia and the proximal tibia is prepared with the modular drill and keel punch for that size.      The femoral sizing guide is placed and size 4 narrow is most appropriate. Rotation is marked off the epicondylar axis and confirmed by creating a rectangular flexion gap at 90 degrees. The size 4 cutting block is pinned in this rotation and the anterior, posterior and chamfer cuts are made with the oscillating saw. The intercondylar block is then placed and that cut is made.      Trial size 3 tibial component, trial size 4 narrow posterior  stabilized femur and a 12.5  mm posterior stabilized rotating platform insert trial is placed. Full extension is achieved with excellent varus/valgus and anterior/posterior balance throughout full range of motion. The patella is everted and thickness measured to be 24  mm. Free hand resection is taken to 14 mm, a 38 template is placed, lug holes are drilled, trial patella is placed,  and it tracks normally. Osteophytes are removed off the posterior femur with the trial in place. All trials are removed and the cut bone surfaces prepared with pulsatile lavage. Cement is mixed and once ready for implantation, the size 3 tibial implant, size  4 narrow posterior stabilized femoral component, and the size 38 patella are cemented in place and the patella is held with the clamp. The trial insert is placed and the knee held in full extension. All extruded cement is removed and once the cement is hard the permanent 12.5 mm posterior stabilized rotating platform insert is placed into the tibial tray.      The wound is copiously irrigated with saline solution and the extensor mechanism closed over a hemovac drain with #1 PDS suture. The tourniquet is released for a total tourniquet time of 39  minutes. Flexion against gravity is 135 degrees and the patella tracks normally. Subcutaneous tissue is closed with 2.0 vicryl and subcuticular with running 4.0 Monocryl. The catheter for the Marcaine pain pump is placed and the pump is initiated. The incision is cleaned and dried and steri-strips and a bulky sterile dressing are applied. The limb is placed into a knee immobilizer and the patient is awakened and transported to recovery in stable condition.      Please note that a surgical assistant was a medical necessity for this procedure in order to perform it in a safe and expeditious manner. Surgical assistant was necessary to retract the ligaments and vital neurovascular structures to prevent injury to them and also necessary for proper positioning of the limb to allow for anatomic placement of the prosthesis.   [name] Cheryl Mcgloin, MD    01/05/2012, 3:13 PM

## 2012-01-05 NOTE — Anesthesia Procedure Notes (Signed)
Spinal  Patient location during procedure: OR Staffing Anesthesiologist: Azell Der CRNA/Resident: Durward Parcel A Performed by: anesthesiologist and resident/CRNA  Preanesthetic Checklist Completed: patient identified, site marked, surgical consent, pre-op evaluation, timeout performed, IV checked, risks and benefits discussed and monitors and equipment checked Spinal Block Patient position: sitting Location: L3-4 Injection technique: single-shot Needle Needle type: Quincke  Needle gauge: 22 G Additional Notes 2 attempts by Flynn-Cook. One attempt by [facility]. CSF clear. No Heme. No paresthesia.

## 2012-01-05 NOTE — Anesthesia Preprocedure Evaluation (Addendum)
Anesthesia Evaluation  Patient identified by MRN, date of birth, ID band Patient awake  General Assessment Comment:H/o difficulty waking up and breathing after 2 previous surgeries. Clearance Dr. Lovell Sheehan.  Reviewed: Allergy & Precautions, H&P , NPO status , Patient's Chart, lab work & pertinent test results  Airway Mallampati: II TM Distance: >3 FB Neck ROM: Full    Dental No notable dental hx.    Pulmonary pneumonia ,  breath sounds clear to auscultation  Pulmonary exam normal       Cardiovascular hypertension, Pt. on medications + dysrhythmias Rhythm:Regular Rate:Normal     Neuro/Psych  Neuromuscular disease negative psych ROS   GI/Hepatic Neg liver ROS, GERD-  Medicated,  Endo/Other  negative endocrine ROS  Renal/GU negative Renal ROS  negative genitourinary   Musculoskeletal negative musculoskeletal ROS (+)   Abdominal   Peds negative pediatric ROS (+)  Hematology negative hematology ROS (+)   Anesthesia Other Findings   Reproductive/Obstetrics negative OB ROS                           Anesthesia Physical Anesthesia Plan  ASA: III  Anesthesia Plan: Spinal   Post-op Pain Management:    Induction: Intravenous  Airway Management Planned:   Additional Equipment:   Intra-op Plan:   Post-operative Plan: Extubation in OR  Informed Consent: I have reviewed the patients History and Physical, chart, labs and discussed the procedure including the risks, benefits and alternatives for the proposed anesthesia with the patient or authorized representative who has indicated his/her understanding and acceptance.   Dental advisory given  Plan Discussed with: CRNA  Anesthesia Plan Comments: (Discussed risks/benefits of spinal including headache, backache, failure, bleeding, infection, and nerve damage. Patient consents to spinal. Questions answered. Coagulation studies and platelet count  acceptable. )       Anesthesia Quick Evaluation

## 2012-01-05 NOTE — Anesthesia Postprocedure Evaluation (Signed)
  Anesthesia Post-op Note  Patient: [name]  Procedure(s) Performed: Procedure(s) (LRB): TOTAL KNEE ARTHROPLASTY (Right)  Patient Location: PACU  Anesthesia Type: Spinal  Level of Consciousness: awake and alert   Airway and Oxygen Therapy: Patient Spontanous Breathing  Post-op Pain: mild  Post-op Assessment: Post-op Vital signs reviewed, Patient's Cardiovascular Status Stable, Respiratory Function Stable, Patent Airway and No signs of Nausea or vomiting  Post-op Vital Signs: stable  Complications: No apparent anesthesia complications. Spinal has regressed more than three levels.

## 2012-01-06 LAB — BASIC METABOLIC PANEL
BUN: 23 mg/dL (ref 6–23)
CO2: 22 mEq/L (ref 19–32)
Calcium: 8.8 mg/dL (ref 8.4–10.5)
Chloride: 98 mEq/L (ref 96–112)
Creatinine, Ser: 1.11 mg/dL — ABNORMAL HIGH (ref 0.50–1.10)
GFR calc Af Amer: 57 mL/min — ABNORMAL LOW (ref >90)
GFR calc non Af Amer: 49 mL/min — ABNORMAL LOW (ref >90)
Glucose, Bld: 256 mg/dL — ABNORMAL HIGH (ref 70–99)
Potassium: 4.5 mEq/L (ref 3.5–5.1)
Sodium: 133 mEq/L — ABNORMAL LOW (ref 135–145)

## 2012-01-06 LAB — CBC
HCT: 38 % (ref 36.0–46.0)
Hemoglobin: 12.7 g/dL (ref 12.0–15.0)
MCH: 29.7 pg (ref 26.0–34.0)
MCHC: 33.4 g/dL (ref 30.0–36.0)
MCV: 89 fL (ref 78.0–100.0)
Platelets: 204 10*3/uL (ref 150–400)
RBC: 4.27 MIL/uL (ref 3.87–5.11)
RDW: 13.3 % (ref 11.5–15.5)
WBC: 17.8 10*3/uL — ABNORMAL HIGH (ref 4.0–10.5)

## 2012-01-06 MED ORDER — MORPHINE SULFATE 2 MG/ML IJ SOLN: 1.0000 mg | INTRAMUSCULAR | Status: DC | PRN

## 2012-01-06 NOTE — Evaluation (Signed)
Physical Therapy Evaluation Patient Details Name: [name] MRN: [id number] DOB: May 28, 1941 Today's Date: 01/06/2012  Problem List:  Patient Active Problem List  Diagnoses  . HELICOBACTER PYLORI GASTRITIS  . HYPERLIPIDEMIA  . GOUT, UNSPECIFIED  . HYPERTENSION  . LEFT BUNDLE BRANCH BLOCK  . GERD  . DIVERTICULOSIS, COLON  . LUMP OR MASS IN BREAST  . CELLULITIS AND ABSCESS OF OTHER SPECIFIED SITE  . 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 FATIGUE  . CHEST PAIN, ATYPICAL  . DYSURIA, CHRONIC  . EPIGASTRIC PAIN  . SUPRAPUBIC PAIN  . HYPERGLYCEMIA  . DIVERTICULITIS, HX OF  . CARPAL TUNNEL SYNDROME, RIGHT  . HIP PAIN, LEFT, CHRONIC  . KNEE PAIN, RIGHT, CHRONIC  . Morbidly obese  . Spasm of lumbar paraspinous muscle  . OA (osteoarthritis) of knee    Past Medical History:  Past Medical History  Diagnosis Date  . Hyperlipidemia   . Hypertension   . Obesity   . Diverticulitis hx of  . Diverticulosis   . Abdominal pain   . Constipation   . Colon adenoma   . Complication of anesthesia 2006-at [facility]    breathing problems-no BP med given prior  . Arthritis   . GERD (gastroesophageal reflux disease)   . Pneumonia 2005  . Numbness and tingling in right hand     pt. states has numbness of right hand very frequently-watch positioning   Past Surgical History:  Past Surgical History  Procedure Date  . Skin draft for trauma   . Abdominal hysterectomy   . Bladder tack   . Colonoscopy [id number]   . Oophorectomy   . Ureter repair for tyransected left ureter   . Temporary ureter stent     PT Assessment/Plan/Recommendation PT Assessment Clinical Impression Statement: 71 y.o. female admitted for R TKA presents with decreased R LE strength/ROM, decreased activity tolerance, acute pain. Will benefit from acute PT to maximize safety and independence with mobility. PT Recommendation/Assessment: Patient will need skilled PT in the acute care venue PT Problem List:  Decreased strength;Decreased range of motion;Decreased activity tolerance;Decreased mobility;Decreased knowledge of use of DME;Obesity;Pain Barriers to Discharge: None PT Therapy Diagnosis : Difficulty walking;Acute pain PT Plan PT Frequency: 7X/week PT Treatment/Interventions: DME instruction;Gait training;Stair training;Functional mobility training;Therapeutic activities;Therapeutic exercise;Patient/family education PT Recommendation Recommendations for Other Services: OT consult Follow Up Recommendations: Home health PT Equipment Recommended: None recommended by PT PT Goals  Acute Rehab PT Goals PT Goal Formulation: With patient/family Time For Goal Achievement: 7 days Pt will go Supine/Side to Sit: with modified independence PT Goal: Supine/Side to Sit - Progress: Goal set today Pt will go Sit to Stand: with modified independence;with upper extremity assist PT Goal: Sit to Stand - Progress: Goal set today Pt will Ambulate: 51 - 150 feet;with modified independence;with rolling walker PT Goal: Ambulate - Progress: Goal set today Pt will Go Up / Down Stairs: 3-5 stairs;with min assist;with least restrictive assistive device PT Goal: Up/Down Stairs - Progress: Goal set today Pt will Perform Home Exercise Program: with min assist PT Goal: Perform Home Exercise Program - Progress: Goal set today  PT Evaluation Precautions/Restrictions  Precautions Precautions: Knee Required Braces or Orthoses: Yes Knee Immobilizer: Discontinue once straight leg raise with < 10 degree lag Restrictions Weight Bearing Restrictions: No Other Position/Activity Restrictions: WBAT Prior Functioning  Home Living Lives With: Spouse;Son Jud Help From: Family Type of Home: House Home Layout: One level Home Access: Stairs to enter Entrance Stairs-Rails: Right Entrance Stairs-Number of Steps: 3  Bathroom Shower/Tub: Teacher, adult education: Environmental consultant - four wheeled;Electric  Scooter;Hand-held shower hose Prior Function Level of Independence: Requires assistive device for independence;Independent with basic ADLs (used motorized scooter for going out, RW in home) Able to Take Stairs?: Yes (with help) Vocation:  (co-pastors a church, 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 Arousal/Alertness: Awake/alert Overall Cognitive Status: Appears within functional limits for tasks assessed Orientation Level: Oriented X4 Sensation/Coordination Sensation Light Touch: Appears Intact Coordination Gross Motor Movements are Fluid and Coordinated: Yes Fine Motor Movements are Fluid and Coordinated: Yes Extremity Assessment RUE Assessment RUE Assessment: Within Functional Limits LUE Assessment LUE Assessment: Within Functional Limits RLE Assessment RLE Assessment: Exceptions to [facility] RLE AROM (degrees) Right Knee Flexion 0-140: 35  RLE Strength Right Hip Flexion: 2-/5 LLE Assessment LLE Assessment: Within Functional Limits Mobility (including Balance) Bed Mobility Bed Mobility: Yes Supine to Sit: 1: +2 Total assist;HOB flat;With rails Supine to Sit Details (indicate cue type and reason): pt 50% Transfers Transfers: Yes Sit to Stand: With upper extremity assist;From bed;1: +2 Total assist (+2 for safety, ) Sit to Stand Details (indicate cue type and reason): VCs for set up positioning, +2 for safety, pt 65% Stand to Sit: 4: Min assist;To chair/3-in-1;With armrests;With upper extremity assist Stand to Sit Details: VCs hand placement Ambulation/Gait Ambulation/Gait: Yes Ambulation/Gait Assistance: 4: Min assist Ambulation/Gait Assistance Details (indicate cue type and reason): min A for balance/safety; pt 90% Ambulation Distance (Feet): 35 Feet Assistive device: Rolling walker Gait Pattern: Step-to pattern    Exercise  Total Joint Exercises Ankle Circles/Pumps: AROM;Both;10 reps;Supine Quad Sets: AROM;Both;10 reps;Supine Heel Slides: AAROM;Right;10  reps;Other reps (comment) End of Session PT - End of Session Equipment Utilized During Treatment: Gait belt;Right knee immobilizer Activity Tolerance: Patient limited by fatigue (pt c/o lightheadedness after walking, BP elevated, RN notifi) Patient left: in chair;with call bell in reach;with family/visitor present Nurse Communication: Mobility status for transfers;Mobility status for ambulation General Behavior During Session: [facility] for tasks performed Cognition: [facility] for tasks performed  Tamala Ser 01/06/2012, 11:52 AM [phone]

## 2012-01-06 NOTE — Progress Notes (Signed)
Physical Therapy Treatment Patient Details Name: [name] MRN: [id number] DOB: 09-Jul-1941 Today's Date: 01/06/2012  PT Assessment/Plan  PT - Assessment/Plan Comments on Treatment Session: Pt fatigued from sitting up in chair, did well for POD #1, BP has come down since last session. Expect good progress tomorrow. Pt has decent R quad contraction. PT Plan: Discharge plan remains appropriate PT Frequency: 7X/week Recommendations for Other Services: OT consult Follow Up Recommendations: Home health PT Equipment Recommended: None recommended by PT PT Goals  Acute Rehab PT Goals PT Goal Formulation: With patient/family Time For Goal Achievement: 7 days Pt will go Supine/Side to Sit: with modified independence PT Goal: Supine/Side to Sit - Progress: Goal set today Pt will go Sit to Stand: with modified independence;with upper extremity assist PT Goal: Sit to Stand - Progress: Progressing toward goal Pt will Ambulate: 51 - 150 feet;with modified independence;with rolling walker PT Goal: Ambulate - Progress: Progressing toward goal Pt will Go Up / Down Stairs: 3-5 stairs;with min assist;with least restrictive assistive device PT Goal: Up/Down Stairs - Progress: Not met Pt will Perform Home Exercise Program: with min assist PT Goal: Perform Home Exercise Program - Progress: Progressing toward goal  PT Treatment Precautions/Restrictions  Precautions Precautions: Knee Required Braces or Orthoses: Yes Knee Immobilizer: Discontinue once straight leg raise with < 10 degree lag Restrictions Weight Bearing Restrictions: No Other Position/Activity Restrictions: WBAT Mobility (including Balance) Bed Mobility Bed Mobility: Yes Supine to Sit: 1: +2 Total assist;HOB flat;With rails Supine to Sit Details (indicate cue type and reason): pt 50% Sit to Supine: 4: Min assist Sit to Supine - Details (indicate cue type and reason): pt 75%, assist for RLE Transfers Transfers: Yes Sit to Stand:  From chair/3-in-1;With upper extremity assist;3: Mod assist Sit to Stand Details (indicate cue type and reason): pt 70%, VCs for set up Stand to Sit: 4: Min assist;With upper extremity assist;To bed Stand to Sit Details: pt 90%, VCs hand placement Ambulation/Gait Ambulation/Gait: Yes Ambulation/Gait Assistance: 4: Min assist Ambulation/Gait Assistance Details (indicate cue type and reason): VCs sequencing and positioning in RW Ambulation Distance (Feet): 4 Feet Assistive device: Rolling walker Gait Pattern: Step-to pattern    Exercise  Total Joint Exercises Ankle Circles/Pumps: AROM;Both;10 reps;Supine Quad Sets: AROM;Both;10 reps;Supine Heel Slides: AAROM;Right;10 reps;Other reps (comment) End of Session PT - End of Session Equipment Utilized During Treatment: Gait belt;Right knee immobilizer Activity Tolerance: Patient limited by fatigue Patient left: in bed;with call bell in reach;with family/visitor present Nurse Communication: Mobility status for transfers;Mobility status for ambulation General Behavior During Session: [facility] for tasks performed Cognition: [facility] for tasks performed  Tamala Ser 01/06/2012, 1:08 PM [phone]

## 2012-01-06 NOTE — Progress Notes (Signed)
  CARE MANAGEMENT NOTE 01/06/2012  Patient:  Godby,Destinae   Account Number:  [id number]  Date Initiated:  01/06/2012  Documentation initiated by:  Donna Snooks  Subjective/Objective Assessment:   71 yo female admitted s/p TOTAL KNEE ARTHROPLASTY (Right).     Action/Plan:   HOME WHEN STABLE   Anticipated DC Date:  01/08/2012   Anticipated DC Plan:  HOME W HOME HEALTH SERVICES  In-house referral  NA      DC Planning Services  CM consult      PAC Choice  DURABLE MEDICAL EQUIPMENT  HOME HEALTH   Choice offered to / List presented to:  C-1 Patient   DME arranged  [facility]           Status of service:  In process, will continue to follow Medicare Important Message given?   (If response is "NO", the following Medicare IM given date fields will be blank) Date Medicare IM given:   Date Additional Medicare IM given:    Discharge Disposition:    Per UR Regulation:    Comments:  1038 01/06/12 Trixie Maclaren,RN,BSN [phone] Cm spoke with pt with daughter-n-law at the bedside concerning d/c planning. Pt states desire to discharge home when medically stable. Family to assist in care. pt given choice list for [facility]. Pt request HHPT/aide, Rw. No choice for Hh services made at this time.

## 2012-01-06 NOTE — Progress Notes (Signed)
Subjective: 1 Day Post-Op Procedure(s) (LRB): TOTAL KNEE ARTHROPLASTY (Right) Patient reports pain as mild.  Rested well overnight.  No n/v.  Objective: Vital signs in last 24 hours: Temp:  [97.1 F (36.2 C)-98.3 F (36.8 C)] 97.7 F (36.5 C) (03/09 0600) Pulse Rate:  [58-91] 91  (03/09 0600) Resp:  [15-28] 28  (03/09 0600) BP: (97-174)/(46-89) 134/83 mmHg (03/09 0600) SpO2:  [93 %-100 %] 98 % (03/09 0600) Weight:  [126.554 kg (279 lb)] 126.554 kg (279 lb) (03/08 1704)  Intake/Output from previous day: 03/08 0701 - 03/09 0700 In: 3690 [P.O.:840; I.V.:2600; IV Piggyback:250] Out: 1165 [Urine:1095; Drains:45; Blood:25] Intake/Output this shift:     Basename 01/06/12 0452  HGB 12.7    Basename 01/06/12 0452  WBC 17.8*  RBC 4.27  HCT 38.0  PLT 204    Basename 01/06/12 0452  NA 133*  K 4.5  CL 98  CO2 22  BUN 23  CREATININE 1.11*  GLUCOSE 256*  CALCIUM 8.8   No results found for this basename: LABPT:2,INR:2 in the last 72 hours  wound dressed adn dry.  NVI R LE.  drain and pain pump in place.  Assessment/Plan: 1 Day Post-Op Procedure(s) (LRB): TOTAL KNEE ARTHROPLASTY (Right) Up with therapy HV drain removed. OOB with PT today.  [name] 01/06/2012, 8:15 AM

## 2012-01-06 NOTE — Progress Notes (Signed)
Pt. Blood pressure has consistently been increasing through the afternoon. Pt asymptomatic. MD notified.  Order received for PRN Hydralazine.  Will continue to monitor pt. Clovia Cuff

## 2012-01-07 LAB — CBC
HCT: 33.1 % — ABNORMAL LOW (ref 36.0–46.0)
Hemoglobin: 11.4 g/dL — ABNORMAL LOW (ref 12.0–15.0)
MCH: 30.4 pg (ref 26.0–34.0)
MCHC: 34.4 g/dL (ref 30.0–36.0)
MCV: 88.3 fL (ref 78.0–100.0)
Platelets: 187 10*3/uL (ref 150–400)
RBC: 3.75 MIL/uL — ABNORMAL LOW (ref 3.87–5.11)
RDW: 13.3 % (ref 11.5–15.5)
WBC: 16.6 10*3/uL — ABNORMAL HIGH (ref 4.0–10.5)

## 2012-01-07 LAB — BASIC METABOLIC PANEL
BUN: 15 mg/dL (ref 6–23)
CO2: 29 mEq/L (ref 19–32)
Calcium: 9.1 mg/dL (ref 8.4–10.5)
Chloride: 94 mEq/L — ABNORMAL LOW (ref 96–112)
Creatinine, Ser: 1.02 mg/dL (ref 0.50–1.10)
GFR calc Af Amer: 64 mL/min — ABNORMAL LOW (ref >90)
GFR calc non Af Amer: 55 mL/min — ABNORMAL LOW (ref >90)
Glucose, Bld: 142 mg/dL — ABNORMAL HIGH (ref 70–99)
Potassium: 4.3 mEq/L (ref 3.5–5.1)
Sodium: 130 mEq/L — ABNORMAL LOW (ref 135–145)

## 2012-01-07 NOTE — Progress Notes (Signed)
Subjective: 2 Days Post-Op Procedure(s) (LRB): TOTAL KNEE ARTHROPLASTY (Right)   Patient reports pain as mild.  Doing well, no events. Pain well controlled.  Objective:   VITALS:   Filed Vitals:   01/07/12 0506  BP: 141/82  Pulse: 80  Temp: 98.3 F (36.8 C)  Resp: 18    Neurovascular intact Dorsiflexion/Plantar flexion intact Incision: dressing C/D/I No cellulitis present Compartment soft  LABS  Basename 01/07/12 0440 01/06/12 0452  HGB 11.4* 12.7  HCT 33.1* 38.0  WBC 16.6* 17.8*  PLT 187 204     Basename 01/07/12 0440 01/06/12 0452  NA 130* 133*  K 4.3 4.5  BUN 15 23  CREATININE 1.02 1.11*  GLUCOSE 142* 256*     Assessment/Plan: 2 Days Post-Op Procedure(s) (LRB): TOTAL KNEE ARTHROPLASTY (Right)   Pain ball d/c'ed Dressing changed to 4x4 guaze and tape Up with therapy   Anastasio Auerbach. Eloyse Causey   PAC  01/07/2012, 9:14 AM

## 2012-01-07 NOTE — Progress Notes (Signed)
01/07/12 1113 Lexys Milliner,RN,BSN [phone] Cm spoke with pt with adult daughter at bedside concerning d/c planning. Per pt choice [facility] to provide [facility] services. Cm contacted [facility] on-call rep Tiax required documentation to Prudencio Pair @ [phone]. Per Inetta Fermo unable to confirm acceptance of pt's insurance. CM to [phone]. [employer] rep Orion Crook will contact Weekday Cm with further details. Pt request 3n1 and Rw. [facility] notifed of referral, DMe delivery to room prior to d/c.

## 2012-01-07 NOTE — Progress Notes (Signed)
Physical Therapy Treatment Patient Details Name: [name] MRN: [id number] DOB: 05/11/1941 Today's Date: 01/07/2012  PT Assessment/Plan  PT - Assessment/Plan Comments on Treatment Session: Patient sleepy, but participative.  Very limited ambulation due to left knee creiptus and instability.  Spoke with daughter about knee brace for left knee and possible rehab.  She reports family will do what they have to to take her home.  Reports walking limited PTA with arthritis and used electric scooter at times.   PT Plan: Discharge plan remains appropriate PT Frequency: 7X/week Follow Up Recommendations: Home health PT;Supervision/Assistance - 24 hour Equipment Recommended: None recommended by PT PT Goals  Acute Rehab PT Goals Pt will go Sit to Stand: with modified independence;with upper extremity assist PT Goal: Sit to Stand - Progress: Progressing toward goal Pt will Ambulate: 51 - 150 feet;with modified independence;with rolling walker PT Goal: Ambulate - Progress: Progressing toward goal Pt will Perform Home Exercise Program: with min assist PT Goal: Perform Home Exercise Program - Progress: Progressing toward goal  PT Treatment Precautions/Restrictions  Precautions Precautions: Knee Required Braces or Orthoses: Yes Knee Immobilizer: Discontinue once straight leg raise with < 10 degree lag Restrictions Weight Bearing Restrictions: Yes RLE Weight Bearing: Weight bearing as tolerated Other Position/Activity Restrictions: WBAT Mobility (including Balance) Transfers Transfers: Yes Sit to Stand: 3: Mod assist;With upper extremity assist;With armrests;From chair/3-in-1 Sit to Stand Details (indicate cue type and reason): cues for technique and hand placement Stand to Sit: 3: Mod assist;With armrests;To chair/3-in-1 Stand to Sit Details: cues for hand placement and leg out in front Ambulation/Gait Ambulation/Gait Assistance: 3: Mod assist Ambulation/Gait Assistance Details (indicate  cue type and reason): cues for posture, audible crepitus in left knee with movement and pt. reports painful.  Ambulation Distance (Feet): 10 Feet Assistive device: Rolling walker Gait Pattern: Decreased stride length;Trunk flexed;Antalgic  Posture/Postural Control Posture/Postural Control: Postural limitations Postural Limitations: forward flexed  Exercise  Total Joint Exercises Ankle Circles/Pumps: AROM;Both;10 reps;Seated Quad Sets: AROM;Right;10 reps;Seated Short Arc Quad: AAROM;Right;10 reps;Seated Heel Slides: AAROM;Right;Seated;10 reps Hip ABduction/ADduction: AAROM;Right;10 reps;Seated Straight Leg Raises: AAROM;Right;10 reps;Seated End of Session PT - End of Session Equipment Utilized During Treatment: Gait belt;Right knee immobilizer Activity Tolerance: Patient limited by pain Patient left: in chair;with call bell in reach;with family/visitor present General Behavior During Session: [facility] for tasks performed Cognition: [facility] for tasks performed  [facility] 01/07/2012, 12:35 PM

## 2012-01-07 NOTE — Evaluation (Signed)
Occupational Therapy Evaluation Patient Details Name: [name] MRN: [id number] DOB: 08-Jun-1941 Today's Date: 01/07/2012  Problem List:  Patient Active Problem List  Diagnoses  . HELICOBACTER PYLORI GASTRITIS  . HYPERLIPIDEMIA  . GOUT, UNSPECIFIED  . HYPERTENSION  . LEFT BUNDLE BRANCH BLOCK  . GERD  . DIVERTICULOSIS, COLON  . LUMP OR MASS IN BREAST  . CELLULITIS AND ABSCESS OF OTHER SPECIFIED SITE  . 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 FATIGUE  . CHEST PAIN, ATYPICAL  . DYSURIA, CHRONIC  . EPIGASTRIC PAIN  . SUPRAPUBIC PAIN  . HYPERGLYCEMIA  . DIVERTICULITIS, HX OF  . CARPAL TUNNEL SYNDROME, RIGHT  . HIP PAIN, LEFT, CHRONIC  . KNEE PAIN, RIGHT, CHRONIC  . Morbidly obese  . Spasm of lumbar paraspinous muscle  . OA (osteoarthritis) of knee    Past Medical History:  Past Medical History  Diagnosis Date  . Hyperlipidemia   . Hypertension   . Obesity   . Diverticulitis hx of  . Diverticulosis   . Abdominal pain   . Constipation   . Colon adenoma   . Complication of anesthesia 2006-at [facility]    breathing problems-no BP med given prior  . Arthritis   . GERD (gastroesophageal reflux disease)   . Pneumonia 2005  . Numbness and tingling in right hand     pt. states has numbness of right hand very frequently-watch positioning   Past Surgical History:  Past Surgical History  Procedure Date  . Skin draft for trauma   . Abdominal hysterectomy   . Bladder tack   . Colonoscopy [id number]   . Oophorectomy   . Ureter repair for tyransected left ureter   . Temporary ureter stent     OT Assessment/Plan/Recommendation OT Assessment Clinical Impression Statement: This 71 yo female s/p RTKR presents to acute OT with problems below. Pt will benefit from acute OT with follow-up at SNF to get back to an I/Mod I leve. Will follow. OT Recommendation/Assessment: Patient will need skilled OT in the acute care venue OT Problem List: Decreased  strength;Decreased activity tolerance;Impaired balance (sitting and/or standing);Decreased safety awareness;Decreased cognition;Decreased knowledge of use of DME or AE;Decreased knowledge of precautions;Obesity;Pain Barriers to Discharge: Decreased caregiver support OT Therapy Diagnosis : Generalized weakness;Acute pain OT Plan OT Frequency: Min 2X/week OT Treatment/Interventions: Self-care/ADL training;DME and/or AE instruction;Cognitive remediation/compensation;Patient/family education;Balance training OT Recommendation Follow Up Recommendations: Skilled nursing facility (progressing slowly, if this picks up then [facility]) Equipment Recommended: 3 in 1 bedside comode Individuals Consulted Consulted and Agree with Results and Recommendations: Patient;Family member/caregiver Family Member Consulted: daughter OT Goals Acute Rehab OT Goals OT Goal Formulation: With patient Time For Goal Achievement: 7 days ADL Goals Pt Will Perform Grooming: with set-up;with supervision;Unsupported;Other (comment);Sitting, edge of bed;Sitting, chair (2 tasks) ADL Goal: Grooming - Progress: Goal set today Pt Will Perform Upper Body Bathing: with set-up;with supervision;Unsupported;Sitting, chair;Sitting, edge of bed ADL Goal: Upper Body Bathing - Progress: Goal set today Pt Will Perform Lower Body Bathing: with min assist;Sit to stand from chair;Sit to stand from bed;with adaptive equipment;Unsupported ADL Goal: Lower Body Bathing - Progress: Goal set today Pt Will Perform Upper Body Dressing: with set-up;with supervision;Unsupported;Sitting, bed;Sitting, chair ADL Goal: Upper Body Dressing - Progress: Goal set today Pt Will Perform Lower Body Dressing: with min assist;Unsupported;Sit to stand from chair;Sit to stand from bed;with adaptive equipment ADL Goal: Lower Body Dressing - Progress: Goal set today Pt Will Transfer to Toilet: with min assist;Ambulation;with DME;3-in-1 (in bathroom) ADL Goal: Toilet  Transfer - Progress: Goal set today Pt Will Perform Toileting - Clothing Manipulation: with supervision;Standing ADL Goal: Toileting - Clothing Manipulation - Progress: Goal set today Pt Will Perform Toileting - Hygiene: with supervision;Sit to stand from 3-in-1/toilet ADL Goal: Toileting - Hygiene - Progress: Goal set today Additional ADL Goal #1: Pt will be Min A for in bed and OOB for BADLs. ADL Goal: Additional Goal #1 - Progress: Goal set today  OT Evaluation Precautions/Restrictions  Precautions Precautions: Knee Required Braces or Orthoses: Yes Knee Immobilizer: Discontinue once straight leg raise with < 10 degree lag Restrictions Weight Bearing Restrictions: Yes RLE Weight Bearing: Weight bearing as tolerated Other Position/Activity Restrictions: WBAT Prior Functioning Home Living Lives With: Spouse;Son [place] Help From: Family;Other (Comment) (not there all the time per daughter) Type of Home: House Home Layout: One level Home Access: Stairs to enter Entrance Stairs-Rails: Right Entrance Stairs-Number of Steps: 3 Bathroom Shower/Tub: Walk-in shower;Door [employer] Toilet: Standard Bathroom Accessibility: Yes How Accessible: Accessible via walker Home Adaptive Equipment: Dan Humphreys - four wheeled;Electric Scooter;Hand-held shower hose Prior Function Level of Independence: Independent with basic ADLs;Requires assistive device for independence;Other (comment) (uses RW in home, scooter when out and about) Driving: No Vocation:  (co-pastors a church, has a Comptroller business) ADL ADL Eating/Feeding: Simulated;Set up Where Assessed - Eating/Feeding: Chair Grooming: Simulated;Set up Where Assessed - Grooming: Sitting, chair;Supported Upper Body Bathing: Simulated;Moderate assistance Where Assessed - Upper Body Bathing: Supported;Sitting, chair Lower Body Bathing: Simulated;+2 Total assistance;Other (comment) (with +2 for sit to stand--pt=80%) Where Assessed - Lower Body  Bathing: Supported;Sit to stand from chair Upper Body Dressing: Simulated;Moderate assistance Where Assessed - Upper Body Dressing: Supported;Sitting, chair Lower Body Dressing: Simulated;+2 Total assistance (with +2 for sit to stand--pt=80%) Where Assessed - Lower Body Dressing: Sit to stand from chair;Supported Statistician: Performed;+2 Total assistance;Other (comment) (pt=80%, very slow and cues for which leg to advance) Toilet Transfer Method: Stand pivot Toilet Transfer Equipment: Bedside commode Toileting - Clothing Manipulation: +2 Total assistance;Performed (One to A with maintain standing, one for clothes) Where Assessed - Toileting Clothing Manipulation: Standing Toileting - Hygiene: Performed;+2 Total assistance;Other (comment) (one to maintain standing and one for cleaning) Where Assessed - Toileting Hygiene: Standing Tub/Shower Transfer: Not assessed Tub/Shower Transfer Method: Not assessed Equipment Used: Rolling walker;Other (comment) (gait belt, 3-n-1) Ambulation Related to ADLs: total A +2 (pt=80%), moves slowly Vision/Perception    Cognition Cognition Arousal/Alertness: Lethargic Overall Cognitive Status: Appears within functional limits for tasks assessed (however pt very drousy) Sensation/Coordination   Extremity Assessment RUE Assessment RUE Assessment:  (4/5) LUE Assessment LUE Assessment:  (4/5) Mobility  Bed Mobility Bed Mobility: No Transfers Transfers: Yes Sit to Stand: 1: +2 Total assist;Patient percentage (comment);With upper extremity assist;With armrests;From chair/3-in-1 (80%) Sit to Stand Details (indicate cue type and reason): cues for technique and hand placement Stand to Sit: 1: +2 Total assist;With upper extremity assist;With armrests;To chair/3-in-1 (80%) Stand to Sit Details: cues for hand placement and leg out in front Exercises Total Joint Exercises Ankle Circles/Pumps: AROM;Both;10 reps;Seated Quad Sets: AROM;Right;10  reps;Seated Short Arc Quad: AAROM;Right;10 reps;Seated Heel Slides: AAROM;Right;Seated;10 reps Hip ABduction/ADduction: AAROM;Right;10 reps;Seated Straight Leg Raises: AAROM;Right;10 reps;Seated End of Session OT - End of Session Equipment Utilized During Treatment: Gait belt;Other (comment) (RW, 3-n-1) Activity Tolerance: Patient limited by fatigue;Patient limited by pain;Other (comment) (limited by lethargy, kept nodding off) Patient left: in chair;with family/visitor present (daughter-- she had call bell) Nurse Communication: Mobility status for transfers General Behavior During Session: Lethargic Cognition: WFL for tasks  performed   Evette Georges [phone] 01/07/2012, 2:14 PM

## 2012-01-07 NOTE — Progress Notes (Signed)
Physical Therapy Treatment Patient Details Name: [name] MRN: [id number] DOB: 06-08-1941 Today's Date: 01/07/2012  PT Assessment/Plan  PT - Assessment/Plan Comments on Treatment Session: Patient much improved.  Feel she can be managed safely at home with HHPT and family assist.  Exercise deferred this pm due to ortho tech to place patient in CPM. PT Plan: Discharge plan remains appropriate PT Frequency: 7X/week Follow Up Recommendations: Home health PT Equipment Recommended: None recommended by PT PT Goals  Acute Rehab PT Goals Pt will go Supine/Side to Sit: with modified independence PT Goal: Supine/Side to Sit - Progress: Progressing toward goal Pt will go Sit to Stand: with modified independence PT Goal: Sit to Stand - Progress: Progressing toward goal Pt will Ambulate: 51 - 150 feet;with modified independence;with rolling walker PT Goal: Ambulate - Progress: Progressing toward goal  PT Treatment Precautions/Restrictions  Precautions Precautions: Knee Required Braces or Orthoses: Yes Knee Immobilizer: Discontinue once straight leg raise with < 10 degree lag Restrictions Weight Bearing Restrictions: Yes RLE Weight Bearing: Weight bearing as tolerated Other Position/Activity Restrictions: WBAT Mobility (including Balance) Bed Mobility Bed Mobility: No Sit to Supine: 4: Min assist Sit to Supine - Details (indicate cue type and reason): for right LE Transfers Sit to Stand: From chair/3-in-1;With upper extremity assist;3: Mod assist Sit to Stand Details (indicate cue type and reason): cues for using left leg to stand and for hand placement Stand to Sit: 3: Mod assist Stand to Sit Details: assist to slide right leg out  with cues Ambulation/Gait Ambulation/Gait Assistance: 4: Min assist Ambulation/Gait Assistance Details (indicate cue type and reason): had lowered walker this am. patient much more alert and reports pain medication had made her too sleepy to walk; still  forward flexed , but no noted crepitus left knee except with transfers Ambulation Distance (Feet): 85 Feet Assistive device: Rolling walker Gait Pattern: Step-to pattern;Trunk flexed    Exercise    End of Session PT - End of Session Equipment Utilized During Treatment: Gait belt;Left knee immobilizer Patient left: in bed;with call bell in reach General Behavior During Session: [facility] for tasks performed Cognition: [facility] for tasks performed  [facility] 01/07/2012, 4:57 PM

## 2012-01-08 LAB — CBC
HCT: 31.6 % — ABNORMAL LOW (ref 36.0–46.0)
Hemoglobin: 10.7 g/dL — ABNORMAL LOW (ref 12.0–15.0)
MCH: 29.7 pg (ref 26.0–34.0)
MCHC: 33.9 g/dL (ref 30.0–36.0)
MCV: 87.8 fL (ref 78.0–100.0)
Platelets: 211 10*3/uL (ref 150–400)
RBC: 3.6 MIL/uL — ABNORMAL LOW (ref 3.87–5.11)
RDW: 13.5 % (ref 11.5–15.5)
WBC: 15.1 10*3/uL — ABNORMAL HIGH (ref 4.0–10.5)

## 2012-01-08 MED ORDER — RIVAROXABAN 10 MG PO TABS: 10.0000 mg | ORAL_TABLET | Freq: Every day | ORAL | Status: DC

## 2012-01-08 MED ORDER — METHOCARBAMOL 500 MG PO TABS: 500.0000 mg | ORAL_TABLET | Freq: Four times a day (QID) | ORAL | Status: AC | PRN

## 2012-01-08 MED ORDER — OXYCODONE HCL 5 MG PO TABS: 5.0000 mg | ORAL_TABLET | ORAL | Status: AC | PRN

## 2012-01-08 NOTE — Discharge Instructions (Signed)
Knee Rehabilitation, Guidelines Following Surgery Results after knee surgery are often greatly improved when you follow the exercise, range of motion and muscle strengthening exercises prescribed by your doctor. Safety measures are also important to protect the knee from further injury. Any time any of these exercises cause you to have increased pain or swelling in your knee joint, decrease the amount until you are comfortable again and slowly increase them. If you have problems or questions, call your caregiver or physical therapist for advice. HOME CARE INSTRUCTIONS   Remove items at home which could result in a fall. This includes throw rugs or furniture in walking pathways.   Continue medications as instructed.   You may shower or take tub baths when your staples or stitches are removed or as instructed.   Walk using crutches or walker as instructed.   Put weight on your legs and walk as much as is comfortable.   You may resume a sexual relationship in one month or when given the OK by your doctor.   Return to work as instructed by your doctor.   Do not drive a car for 6 weeks or as instructed.   Wear elastic stockings until instructed not to.   Make sure you keep all of your appointments after your operation with all of your doctors and caregivers.  RANGE OF MOTION AND STRENGTHENING EXERCISES Rehabilitation of the knee is important following a knee injury or an operation. After just a few days of immobilization, the muscles of the thigh which control the knee become weakened and shrink (atrophy). Knee exercises are designed to build up the tone and strength of the thigh muscles and to improve knee motion. Often times heat used for twenty to thirty minutes before working out will loosen up your tissues and help with improving the range of motion. These exercises can be done on a training (exercise) mat, on the floor, on a table or on a bed. Use what ever works the best and is most  comfortable for you Knee exercises include:  Leg Lifts - While your knee is still immobilized in a splint or cast, you can do straight leg raises. Lift the leg to 60 degrees, hold for 3 sec, and slowly lower the leg. Repeat 10-20 times 2-3 times daily. Perform this exercise against resistance later as your knee gets better.   Quad and Hamstring Sets - Tighten up the muscle on the front of the thigh (Quad) and hold for 5-10 sec. Repeat this 10-20 times hourly. Hamstring sets are done by pushing the foot backward against an object and holding for 5-10 sec. Repeat as with quad sets.  A rehabilitation program following serious knee injuries can speed recovery and prevent re-injury in the future due to weakened muscles. Contact your doctor or a physical therapist for more information on knee rehabilitation. MAKE SURE YOU:   Understand these instructions.   Will watch your condition.   Will get help right away if you are not doing well or get worse.  Document Released: 10/16/2005 Document Revised: 10/05/2011 Document Reviewed: 04/05/2007 Exit[employer] Patient Information 2012 [employer].  Pick up stool softner and laxative for home. Do not submerge incision under water. May shower. Continue to use ice for pain and swelling from surgery.  

## 2012-01-08 NOTE — Progress Notes (Signed)
Subjective: 3 Days Post-Op Procedure(s) (LRB): TOTAL KNEE ARTHROPLASTY (Right) Patient reports pain as mild.   Patient seen by rounds with Dr. Lequita Halt. Patient doing OK. Ready to go home.  Objective: Vital signs in last 24 hours: Temp:  [98.3 F (36.8 C)-99.2 F (37.3 C)] 98.4 F (36.9 C) (03/11 0515) Pulse Rate:  [69-86] 86  (03/11 0515) Resp:  [18-20] 20  (03/11 0515) BP: (132-142)/(73-82) 132/82 mmHg (03/11 0515) SpO2:  [93 %-98 %] 96 % (03/11 0515)  Intake/Output from previous day:  Intake/Output Summary (Last 24 hours) at 01/08/12 0732 Last data filed at 01/08/12 0600  Gross per 24 hour  Intake 1647.75 ml  Output   1775 ml  Net -127.25 ml    Intake/Output this shift:    Labs: Results for orders placed during the hospital encounter of 01/05/12  TYPE AND SCREEN      Component Value Range   ABO/RH(D) A POS     Antibody Screen NEG     Sample Expiration 01/08/2012    CBC      Component Value Range   WBC 17.8 (*) 4.0 - 10.5 (K/uL)   RBC 4.27  3.87 - 5.11 (MIL/uL)   Hemoglobin 12.7  12.0 - 15.0 (g/dL)   HCT 16.1  09.6 - 04.5 (%)   MCV 89.0  78.0 - 100.0 (fL)   MCH 29.7  26.0 - 34.0 (pg)   MCHC 33.4  30.0 - 36.0 (g/dL)   RDW 40.9  81.1 - 91.4 (%)   Platelets 204  150 - 400 (K/uL)  BASIC METABOLIC PANEL      Component Value Range   Sodium 133 (*) 135 - 145 (mEq/L)   Potassium 4.5  3.5 - 5.1 (mEq/L)   Chloride 98  96 - 112 (mEq/L)   CO2 22  19 - 32 (mEq/L)   Glucose, Bld 256 (*) 70 - 99 (mg/dL)   BUN 23  6 - 23 (mg/dL)   Creatinine, Ser 7.82 (*) 0.50 - 1.10 (mg/dL)   Calcium 8.8  8.4 - 95.6 (mg/dL)   GFR calc non Af Amer 49 (*) >90 (mL/min)   GFR calc Af Amer 57 (*) >90 (mL/min)  GLUCOSE, CAPILLARY      Component Value Range   Glucose-Capillary 177 (*) 70 - 99 (mg/dL)  CBC      Component Value Range   WBC 16.6 (*) 4.0 - 10.5 (K/uL)   RBC 3.75 (*) 3.87 - 5.11 (MIL/uL)   Hemoglobin 11.4 (*) 12.0 - 15.0 (g/dL)   HCT 21.3 (*) 08.6 - 46.0 (%)   MCV 88.3   78.0 - 100.0 (fL)   MCH 30.4  26.0 - 34.0 (pg)   MCHC 34.4  30.0 - 36.0 (g/dL)   RDW 57.8  46.9 - 62.9 (%)   Platelets 187  150 - 400 (K/uL)  BASIC METABOLIC PANEL      Component Value Range   Sodium 130 (*) 135 - 145 (mEq/L)   Potassium 4.3  3.5 - 5.1 (mEq/L)   Chloride 94 (*) 96 - 112 (mEq/L)   CO2 29  19 - 32 (mEq/L)   Glucose, Bld 142 (*) 70 - 99 (mg/dL)   BUN 15  6 - 23 (mg/dL)   Creatinine, Ser 5.28  0.50 - 1.10 (mg/dL)   Calcium 9.1  8.4 - 41.3 (mg/dL)   GFR calc non Af Amer 55 (*) >90 (mL/min)   GFR calc Af Amer 64 (*) >90 (mL/min)  CBC  Component Value Range   WBC 15.1 (*) 4.0 - 10.5 (K/uL)   RBC 3.60 (*) 3.87 - 5.11 (MIL/uL)   Hemoglobin 10.7 (*) 12.0 - 15.0 (g/dL)   HCT 78.2 (*) 95.6 - 46.0 (%)   MCV 87.8  78.0 - 100.0 (fL)   MCH 29.7  26.0 - 34.0 (pg)   MCHC 33.9  30.0 - 36.0 (g/dL)   RDW 21.3  08.6 - 57.8 (%)   Platelets 211  150 - 400 (K/uL)    Exam: Neurovascular intact Sensation intact distally Incision - clean, dry, no drainage Motor function intact - moving foot and toes well on exam.   Assessment/Plan: 3 Days Post-Op Procedure(s) (LRB): TOTAL KNEE ARTHROPLASTY (Right) Procedure(s) (LRB): TOTAL KNEE ARTHROPLASTY (Right) Past Medical History  Diagnosis Date  . Hyperlipidemia   . Hypertension   . Obesity   . Diverticulitis hx of  . Diverticulosis   . Abdominal pain   . Constipation   . Colon adenoma   . Complication of anesthesia 2006-at [facility]    breathing problems-no BP med given prior  . Arthritis   . GERD (gastroesophageal reflux disease)   . Pneumonia 2005  . Numbness and tingling in right hand     pt. states has numbness of right hand very frequently-watch positioning   Principal Problem:  *OA (osteoarthritis) of knee   Discharge home with home health Diet - heart healthy Follow up - in 2 weeks Activity - WBAT Condition Upon Discharge - Good D/C Meds - See DC Summary DVT Prophylaxis - Xarelto Protocol   [name],  [name] 01/08/2012, 7:32 AM

## 2012-01-08 NOTE — Progress Notes (Signed)
Physical Therapy Treatment Patient Details Name: [name] MRN: [id number] DOB: 1941/04/09 Today's Date: 01/08/2012  L TKR POD #3 D/C today 10:05 - 10:30 1 gt  1 ta  PT Assessment/Plan  PT - Assessment/Plan Comments on Treatment Session: Pt plans to D/C to home.  Family education on stairs and KI, plus level of assist for trans and amb.  Also instructed on use of ICE. PT Plan: Discharge plan remains appropriate Follow Up Recommendations: Home health PT Equipment Recommended: None recommended by PT PT Goals  Acute Rehab PT Goals PT Goal Formulation: With patient Pt will go Supine/Side to Sit: with modified independence PT Goal: Supine/Side to Sit - Progress: Progressing toward goal Pt will go Sit to Stand: with modified independence PT Goal: Sit to Stand - Progress: Progressing toward goal Pt will Ambulate: 51 - 150 feet;with modified independence;with rolling walker PT Goal: Ambulate - Progress: Progressing toward goal Pt will Go Up / Down Stairs: 3-5 stairs;with min assist;with least restrictive assistive device PT Goal: Up/Down Stairs - Progress: Progressing toward goal Pt will Perform Home Exercise Program: with min assist PT Goal: Perform Home Exercise Program - Progress: Progressing toward goal  PT Treatment Precautions/Restrictions  Precautions Precautions: Knee Precaution Comments: Pt and family instructed on KI use for amb, proper application and when to D/C after 10 active SLR. Required Braces or Orthoses: Yes Knee Immobilizer: Discontinue once straight leg raise with < 10 degree lag Restrictions Weight Bearing Restrictions: No RLE Weight Bearing: Weight bearing as tolerated Other Position/Activity Restrictions: Pt and family instructed on WBAT Mobility (including Balance) Bed Mobility Bed Mobility: No (Pt OOB in recliner) Transfers Transfers: Yes Sit to Stand: 4: Min assist;From chair/3-in-1 Sit to Stand Details (indicate cue type and reason): 25% VC's on  hand placement and increased time Stand to Sit: 4: Min assist;To chair/3-in-1 Stand to Sit Details: 25% VC's on hand placement and to advance L LE prior to sit. Ambulation/Gait Ambulation/Gait: Yes Ambulation/Gait Assistance: Other (comment) (MinGuard Assist) Ambulation/Gait Assistance Details (indicate cue type and reason): 50% VC's to increase posture and decrease anteriior lean.  Instructed pt she was a high fall risk to fall forward on her face. Ambulation Distance (Feet): 85 Feet Assistive device: Rolling walker Gait Pattern: Step-to pattern;Trunk flexed;Decreased stance time - left Gait velocity: Pt c/o 3/10 knee pain with amb Stairs: Yes Stairs Assistance: 4: Min assist Stairs Assistance Details (indicate cue type and reason): 50% VC's on proper sequencing and safety using one crutch and one rail.  Demonstrated and instructed with spouse and daughter.  Instructed to have pt def wear KI for steps. Stair Management Technique: One rail Left;With crutches Number of Stairs: 4  Wheelchair Mobility Wheelchair Mobility: No    Exercise    End of Session PT - End of Session Equipment Utilized During Treatment: Gait belt;Left knee immobilizer Activity Tolerance: Patient limited by fatigue Patient left: in chair;with call bell in reach;with family/visitor present Nurse Communication: Other (comment) (Pt ready for D/C to home.) General Behavior During Session: [facility] for tasks performed Cognition: [facility] for tasks performed  Felecia Shelling  PTA [facility]  Acute  Rehab Pager     [phone]

## 2012-01-08 NOTE — Progress Notes (Signed)
CARE MANAGEMENT NOTE 01/08/2012  Patient:  Bansal,Sherea   Account Number:  [id number]  Date Initiated:  01/06/2012  Documentation initiated by:  DAVIS,TYMEEKA  Subjective/Objective Assessment:   71 yo female admitted s/p TOTAL KNEE ARTHROPLASTY (Right).     Action/Plan:   HOME WHEN STABLE   Anticipated DC Date:  01/08/2012   Anticipated DC Plan:  HOME W HOME HEALTH SERVICES  In-house referral  NA      DC Planning Services  CM consult      PAC Choice  DURABLE MEDICAL EQUIPMENT  HOME HEALTH   Choice offered to / List presented to:  C-1 Patient   DME arranged  [facility]ALK[facility] - ROLLING  3-N-1      DME agency  [facility]     HH arranged  HH-2 PT  HH-4 NURSE'S AIDE      HH agency  [facility]   Status of service:  Completed, signed off Medicare Important Message given?  NA - LOS <3 / Initial given by admissions (If response is "NO", the following Medicare IM given date fields will be blank) Date Medicare IM given:   Date Additional Medicare IM given:    Discharge Disposition:  HOME W HOME HEALTH SERVICES  Per UR Regulation:    Comments:  01/08/2012 Raynelle Bring BSN CCM [phone] TCT [facility] rep-Kathy-they can service patient with HHPT with start date 01/09/2012. DME has been delivered to pt's room. Pt for discharge today, accompanied by family members. List of choice HH agencies placed in shadow chart.

## 2012-01-08 NOTE — Clinical Documentation Improvement (Signed)
     BMI DOCUMENTATION CLARIFICATION QUERY  THIS DOCUMENT IS NOT A PERMANENT PART OF THE MEDICAL RECORD  TO RESPOND TO THE THIS QUERY, FOLLOW THE INSTRUCTIONS BELOW:  1. If needed, update documentation for the patient's encounter via the notes activity.  2. Access this query again and click edit on the In [employer].  3. After updating, or not, click F2 to complete all highlighted (required) fields concerning your review. Select "additional documentation in the medical record" OR "no additional documentation provided".  4. Click Sign note button.  5. The deficiency will fall out of your In Basket *Please let us know if you are not able to complete this workflow by phone or e-mail (listed below).         01/08/12  Dear Dr.  Lequita Halt, [facility] Ginsberg [facility]wood  In an effort to better capture your patient's severity of illness, reflect appropriate length of stay and utilization of resources, a review of the patient medical record has revealed the following indicators.    Based on your clinical judgment, please clarify and document in a progress note and/or discharge summary the clinical condition associated with the following supporting information:  In responding to this query please exercise your independent judgment.  The fact that a query is asked, does not imply that any particular answer is desired or expected.   Pt's BMI=  43.2 in setting of obesity per H/P.   Please clarify whether or not BMI and  Obesity can be further specifed as one of  the diagnoses listed below and document in pn  and d/c. Thank You!  [facility]: When linking BMI to a diagnosis please document both BMI and diagnosis together in pn for accuracy of SOI and ROM.   Possible Clinical conditions  Morbid Obesity W/ BMI=   Underweight w/BMI=  Other condition___________________  Cannot Clinically determine _____________  Risk Factors: Sign & Symptoms: BMI-43.2 5'8.5"/287    Reviewed: additional documentation  in the medical record  Thank You,  Enis Slipper RN, BSN, CCDS Clinical Documentation Specialist [facility] Dept Pager: [phone] / E-mail: [email or web address]  Health Information Management [place]

## 2012-01-11 NOTE — Discharge Summary (Signed)
Physician Discharge Summary   Patient ID: [name] MRN: [id number] DOB/AGE: 07-02-1941 71 y.o.  Admit date: 01/05/2012 Discharge date: 01/08/2012   Primary Diagnosis: Osteoarthritis Right Knee   Admission Diagnoses: Past Medical History  Diagnosis Date  . Hyperlipidemia   . Hypertension   . Obesity   . Diverticulitis hx of  . Diverticulosis   . Abdominal pain   . Constipation   . Colon adenoma   . Complication of anesthesia 2006-at [facility]    breathing problems-no BP med given prior  . Arthritis   . GERD (gastroesophageal reflux disease)   . Pneumonia 2005  . Numbness and tingling in right hand     pt. states has numbness of right hand very frequently-watch positioning    Discharge Diagnoses:  Principal Problem:  *OA (osteoarthritis) of knee   Procedure: Procedure(s) (LRB): TOTAL KNEE ARTHROPLASTY (Right)   Consults: None  HPI: [name] is a 71 y.o. year old female with end stage OA of her right knee with progressively worsening pain and dysfunction. She has constant pain, with activity and at rest and significant functional deficits with difficulties even with ADLs. She has had extensive non-op management including analgesics, injections of cortisone and viscosupplements, and home exercise program, but remains in significant pain with significant dysfunction.Radiographs show bone on bone arthritis medial and patellofemoral with varus deformity and tibial subluxation. She presents now for left Total Knee Arthroplasty.      Laboratory Data: Hospital Outpatient Visit on 12/29/2011  Component Date Value Range Status  . aPTT (seconds) 12/29/2011 32  24-37 Final  . WBC (K/uL) 12/29/2011 8.6  4.0-10.5 Final  . RBC (MIL/uL) 12/29/2011 4.70  3.87-5.11 Final  . Hemoglobin (g/dL) 78/29/5621 30.8  65.7-84.6 Final  . HCT (%) 12/29/2011 41.4  36.0-46.0 Final  . MCV (fL) 12/29/2011 88.1  78.0-100.0 Final  . MCH (pg) 12/29/2011 29.8  26.0-34.0 Final  . MCHC (g/dL)  96/29/5284 13.2  44.0-10.2 Final  . RDW (%) 12/29/2011 13.2  11.5-15.5 Final  . Platelets (K/uL) 12/29/2011 229  150-400 Final  . Sodium (mEq/L) 12/29/2011 138  135-145 Final  . Potassium (mEq/L) 12/29/2011 4.2  3.5-5.1 Final  . Chloride (mEq/L) 12/29/2011 104  96-112 Final  . CO2 (mEq/L) 12/29/2011 27  19-32 Final  . Glucose, Bld (mg/dL) 72/53/6644 034* 74-25 Final  . BUN (mg/dL) 95/63/8756 22  4-33 Final  . Creatinine, Ser (mg/dL) 29/51/8841 6.60* 6.30-1.60 Final  . Calcium (mg/dL) 10/93/2355 9.4  7.3-22.0 Final  . Total Protein (g/dL) 25/42/7062 7.4  3.7-6.2 Final  . Albumin (g/dL) 83/15/1761 3.9  6.0-7.3 Final  . AST (U/L) 12/29/2011 30  0-37 Final  . ALT (U/L) 12/29/2011 29  0-35 Final  . Alkaline Phosphatase (U/L) 12/29/2011 48  39-117 Final  . Total Bilirubin (mg/dL) 71/03/2693 0.6  8.5-4.6 Final  . GFR calc non Af Amer (mL/min) 12/29/2011 43* >90 Final  . GFR calc Af Amer (mL/min) 12/29/2011 50* >90 Final   Comment:                                 The eGFR has been calculated                          using the CKD EPI equation.                          This  calculation has not been                          validated in all clinical                          situations.                          eGFR's persistently                          <90 mL/min signify                          possible Chronic Kidney Disease.  Marland Kitchen Prothrombin Time (seconds) 12/29/2011 13.4  11.6-15.2 Final  . INR  12/29/2011 1.00  0.00-1.49 Final  . Color, Urine  12/29/2011 YELLOW  YELLOW Final  . APPearance  12/29/2011 CLEAR  CLEAR Final  . Specific Gravity, Urine  12/29/2011 1.017  1.005-1.030 Final  . pH  12/29/2011 6.0  5.0-8.0 Final  . Glucose, UA (mg/dL) 45/40/9811 NEGATIVE  NEGATIVE Final  . Hgb urine dipstick  12/29/2011 NEGATIVE  NEGATIVE Final  . Bilirubin Urine  12/29/2011 NEGATIVE  NEGATIVE Final  . Ketones, ur (mg/dL) 91/47/8295 NEGATIVE  NEGATIVE Final  . Protein, ur (mg/dL) 62/13/0865  NEGATIVE  NEGATIVE Final  . Urobilinogen, UA (mg/dL) 78/46/9629 0.2  5.2-8.4 Final  . Nitrite  12/29/2011 NEGATIVE  NEGATIVE Final  . Leukocytes, UA  12/29/2011 TRACE* NEGATIVE Final  . MRSA, PCR  12/29/2011 NEGATIVE  NEGATIVE Final  . Staphylococcus aureus  12/29/2011 NEGATIVE  NEGATIVE Final   Comment:                                 The Xpert SA Assay (FDA                          approved for NASAL specimens                          only), is one component of                          a comprehensive surveillance                          program.  It is not intended                          to diagnose infection nor to                          guide or monitor treatment.  . Squamous Epithelial / LPF  12/29/2011 RARE  RARE Final   No results found for this basename: HGB:5 in the last 72 hours No results found for this basename: WBC:2,RBC:2,HCT:2,PLT:2 in the last 72 hours No results found for this basename: NA:2,K:2,CL:2,CO2:2,BUN:2,CREATININE:2,GLUCOSE:2,CALCIUM:2 in the last 72 hours No results found for this basename: LABPT:2,INR:2 in the last 72 hours  X-Rays: Chest 2 View  12/29/2011  *RADIOLOGY REPORT*  Clinical Data: Preop for right total knee replacement. Hypertension.  Hyperlipidemia.  Obesity.  CHEST - 2 VIEW  Comparison: 04/14/2009  Findings: Mid thoracic spondylosis.  Midline trachea.  Borderline cardiomegaly.  Moderate right hemidiaphragm elevation/eventration anteriorly. No pleural effusion or pneumothorax.  No congestive failure.  Mildly low lung volumes. Clear lungs.  IMPRESSION: Borderline cardiomegaly with right hemidiaphragm elevation. No acute findings.  Original Report Authenticated By: Consuello Bossier, M.D.    EKG: Orders placed during the hospital encounter of 01/05/12  . EKG     Hospital Course: Patient was admitted to [facility] and taken to the OR and underwent the above state procedure without complications. Patient tolerated the procedure well and  was later transferred to the recovery room and then to the orthopaedic floor for postoperative care. They were given PO and IV analgesics for pain control following their surgery. They were given 24 hours of postoperative antibiotics and started on DVT prophylaxis. PT and OT were ordered for total joint protocol. Discharge planning consulted to help with postop disposition and equipment needs. Patient had a good night on the evening of surgery and started to get up with therapy on day one. PCA was discontinued and they were weaned over to PO meds. Hemovac drain was pulled without difficulty. Continued to progress with therapy into day two. Dressing was changed on day two and the incision was healing well. By day three, the patient had progressed with therapy and meeting goals. Incision was healing well. Patient was seen in rounds and was ready to go home.   Discharge Medications: Prior to Admission medications   Medication Sig Start Date End Date Taking? Authorizing Provider  amLODipine-benazepril (LOTREL) 5-20 MG per capsule Take 1 capsule by mouth daily after breakfast.   Yes Historical Provider, MD  atenolol-chlorthalidone (TENORETIC) 50-25 MG per tablet Take 1 tablet by mouth daily before lunch.  07/20/11  Yes [name], MD  famotidine (PEPCID) 20 MG tablet Take 20 mg by mouth daily.   Yes Historical Provider, MD  rosuvastatin (CRESTOR) 20 MG tablet Take 1 tablet (20 mg total) by mouth at bedtime. 02/24/11 02/24/12 Yes [name], MD  trimethoprim (TRIMPEX) 100 MG tablet Take 100 mg by mouth daily after breakfast.    Yes Historical Provider, MD  allopurinol (ZYLOPRIM) 100 MG tablet Take 100 mg by mouth daily as needed. Gout  10/16/11 10/15/12  [name], MD  methocarbamol (ROBAXIN) 500 MG tablet Take 1 tablet (500 mg total) by mouth every 6 (six) hours as needed. 01/08/12 01/18/12  Jakelyn Squyres, PA  oxyCODONE (OXY IR/ROXICODONE) 5 MG immediate release tablet Take 1-2 tablets (5-10  mg total) by mouth every 4 (four) hours as needed for pain. 01/08/12 01/18/12  Chizara Mena Julien Girt, PA  rivaroxaban (XARELTO) 10 MG TABS tablet Take 1 tablet (10 mg total) by mouth daily with breakfast. 01/08/12   Ayisha Pol Julien Girt, PA    Diet: heart healthy Activity:WBAT Follow-up:in 2 weeks Disposition: Home Discharged Condition: good   Discharge Orders    Future Appointments: Provider: Department: Dept Phone: Center:   02/15/2012 11:15 AM [name], MD [facility] [phone] [facility] [place]     Future Orders Please Complete By Expires   Diet - low sodium heart healthy      Call MD / Call 911      Comments:   If you experience chest pain or shortness of breath, CALL 911 and be transported to the hospital emergency room.  If you develope a fever above 101 F, pus (white drainage) or increased drainage or  redness at the wound, or calf pain, call your surgeon's office.   Constipation Prevention      Comments:   Drink plenty of fluids.  Prune juice may be helpful.  You may use a stool softener, such as Colace (over the counter) 100 mg twice a day.  Use MiraLax (over the counter) for constipation as needed.   Increase activity slowly as tolerated      Weight Bearing as taught in Physical Therapy      Comments:   Use a walker or crutches as instructed.   Discharge instructions      Comments:   Pick up stool softner and laxative for home. Do not submerge incision under water. May shower. Continue to use ice for pain and swelling from surgery.    Driving restrictions      Comments:   No driving   Lifting restrictions      Comments:   No lifting   TED hose      Comments:   Use stockings (TED hose) for 3 weeks on both leg(s).  You may remove them at night for sleeping.   Change dressing      Comments:   Change dressing  daily with sterile 4 x 4 inch gauze dressing and apply TED hose.  You may clean the incision with alcohol prior to redressing.   Do not put a pillow under the  knee. Place it under the heel.        Medication List  As of 01/11/2012 11:12 AM   STOP taking these medications         aspirin EC 81 MG tablet      b complex vitamins tablet      naproxen sodium 220 MG tablet         TAKE these medications         allopurinol 100 MG tablet   Commonly known as: ZYLOPRIM   Take 100 mg by mouth daily as needed. Gout        amLODipine-benazepril 5-20 MG per capsule   Commonly known as: LOTREL   Take 1 capsule by mouth daily after breakfast.      atenolol-chlorthalidone 50-25 MG per tablet   Commonly known as: TENORETIC   Take 1 tablet by mouth daily before lunch.      famotidine 20 MG tablet   Commonly known as: PEPCID   Take 20 mg by mouth daily.      methocarbamol 500 MG tablet   Commonly known as: ROBAXIN   Take 1 tablet (500 mg total) by mouth every 6 (six) hours as needed.      oxyCODONE 5 MG immediate release tablet   Commonly known as: Oxy IR/ROXICODONE   Take 1-2 tablets (5-10 mg total) by mouth every 4 (four) hours as needed for pain.      rivaroxaban 10 MG Tabs tablet   Commonly known as: XARELTO   Take 1 tablet (10 mg total) by mouth daily with breakfast.      rosuvastatin 20 MG tablet   Commonly known as: CRESTOR   Take 1 tablet (20 mg total) by mouth at bedtime.      trimethoprim 100 MG tablet   Commonly known as: TRIMPEX   Take 100 mg by mouth daily after breakfast.           Follow-up Information    Follow up with Loanne Drilling, MD. Schedule an appointment as soon as possible for a visit in 2 weeks.  Contact information:   [facility] [address] [place] [address] [phone]          Signed: Patrica Duel 01/11/2012, 11:12 AM

## 2012-01-11 NOTE — Progress Notes (Signed)
Physician Discharge Summary   Patient ID: [name] MRN: [id number] DOB/AGE: 1941-02-08 71 y.o.  Admit date: 01/05/2012 Discharge date: 01/08/2012  Primary Diagnosis: Osteoarthritis Right Knee  Admission Diagnoses: Past Medical History  Diagnosis Date  . Hyperlipidemia   . Hypertension   . Obesity   . Diverticulitis hx of  . Diverticulosis   . Abdominal pain   . Constipation   . Colon adenoma   . Complication of anesthesia 2006-at [facility]    breathing problems-no BP med given prior  . Arthritis   . GERD (gastroesophageal reflux disease)   . Pneumonia 2005  . Numbness and tingling in right hand     pt. states has numbness of right hand very frequently-watch positioning    Discharge Diagnoses:  Principal Problem:  *OA (osteoarthritis) of knee   Procedure: Procedure(s) (LRB): TOTAL KNEE ARTHROPLASTY (Right)   Consults: None  HPI: [name] is a 71 y.o. year old female with end stage OA of her right knee with progressively worsening pain and dysfunction. She has constant pain, with activity and at rest and significant functional deficits with difficulties even with ADLs. She has had extensive non-op management including analgesics, injections of cortisone and viscosupplements, and home exercise program, but remains in significant pain with significant dysfunction.Radiographs show bone on bone arthritis medial and patellofemoral with varus deformity and tibial subluxation. She presents now for left Total Knee Arthroplasty.   Laboratory Data: Hospital Outpatient Visit on 12/29/2011  Component Date Value Range Status  . aPTT (seconds) 12/29/2011 32  24-37 Final  . WBC (K/uL) 12/29/2011 8.6  4.0-10.5 Final  . RBC (MIL/uL) 12/29/2011 4.70  3.87-5.11 Final  . Hemoglobin (g/dL) 40/98/1191 47.8  29.5-62.1 Final  . HCT (%) 12/29/2011 41.4  36.0-46.0 Final  . MCV (fL) 12/29/2011 88.1  78.0-100.0 Final  . MCH (pg) 12/29/2011 29.8  26.0-34.0 Final  . MCHC (g/dL) 30/86/5784  69.6  29.5-28.4 Final  . RDW (%) 12/29/2011 13.2  11.5-15.5 Final  . Platelets (K/uL) 12/29/2011 229  150-400 Final  . Sodium (mEq/L) 12/29/2011 138  135-145 Final  . Potassium (mEq/L) 12/29/2011 4.2  3.5-5.1 Final  . Chloride (mEq/L) 12/29/2011 104  96-112 Final  . CO2 (mEq/L) 12/29/2011 27  19-32 Final  . Glucose, Bld (mg/dL) 13/24/4010 272* 53-66 Final  . BUN (mg/dL) 44/12/4740 22  5-95 Final  . Creatinine, Ser (mg/dL) 63/87/5643 3.29* 5.18-8.41 Final  . Calcium (mg/dL) 66/03/3015 9.4  0.1-09.3 Final  . Total Protein (g/dL) 23/55/7322 7.4  0.2-5.4 Final  . Albumin (g/dL) 27/03/2375 3.9  2.8-3.1 Final  . AST (U/L) 12/29/2011 30  0-37 Final  . ALT (U/L) 12/29/2011 29  0-35 Final  . Alkaline Phosphatase (U/L) 12/29/2011 48  39-117 Final  . Total Bilirubin (mg/dL) 51/76/1607 0.6  3.7-1.0 Final  . GFR calc non Af Amer (mL/min) 12/29/2011 43* >90 Final  . GFR calc Af Amer (mL/min) 12/29/2011 50* >90 Final   Comment:                                 The eGFR has been calculated                          using the CKD EPI equation.                          This calculation has not been  validated in all clinical                          situations.                          eGFR's persistently                          <90 mL/min signify                          possible Chronic Kidney Disease.  Marland Kitchen Prothrombin Time (seconds) 12/29/2011 13.4  11.6-15.2 Final  . INR  12/29/2011 1.00  0.00-1.49 Final  . Color, Urine  12/29/2011 YELLOW  YELLOW Final  . APPearance  12/29/2011 CLEAR  CLEAR Final  . Specific Gravity, Urine  12/29/2011 1.017  1.005-1.030 Final  . pH  12/29/2011 6.0  5.0-8.0 Final  . Glucose, UA (mg/dL) 16/07/9603 NEGATIVE  NEGATIVE Final  . Hgb urine dipstick  12/29/2011 NEGATIVE  NEGATIVE Final  . Bilirubin Urine  12/29/2011 NEGATIVE  NEGATIVE Final  . Ketones, ur (mg/dL) 54/06/8118 NEGATIVE  NEGATIVE Final  . Protein, ur (mg/dL) 14/78/2956 NEGATIVE   NEGATIVE Final  . Urobilinogen, UA (mg/dL) 21/30/8657 0.2  8.4-6.9 Final  . Nitrite  12/29/2011 NEGATIVE  NEGATIVE Final  . Leukocytes, UA  12/29/2011 TRACE* NEGATIVE Final  . MRSA, PCR  12/29/2011 NEGATIVE  NEGATIVE Final  . Staphylococcus aureus  12/29/2011 NEGATIVE  NEGATIVE Final   Comment:                                 The Xpert SA Assay (FDA                          approved for NASAL specimens                          only), is one component of                          a comprehensive surveillance                          program.  It is not intended                          to diagnose infection nor to                          guide or monitor treatment.  . Squamous Epithelial / LPF  12/29/2011 RARE  RARE Final   No results found for this basename: HGB:5 in the last 72 hours No results found for this basename: WBC:2,RBC:2,HCT:2,PLT:2 in the last 72 hours No results found for this basename: NA:2,K:2,CL:2,CO2:2,BUN:2,CREATININE:2,GLUCOSE:2,CALCIUM:2 in the last 72 hours No results found for this basename: LABPT:2,INR:2 in the last 72 hours  X-Rays: Chest 2 View  12/29/2011  *RADIOLOGY REPORT*  Clinical Data: Preop for right total knee replacement. Hypertension.  Hyperlipidemia.  Obesity.  CHEST - 2 VIEW  Comparison: 04/14/2009  Findings: Mid thoracic spondylosis.  Midline trachea.  Borderline cardiomegaly.  Moderate right hemidiaphragm elevation/eventration anteriorly.  No pleural effusion or pneumothorax.  No congestive failure.  Mildly low lung volumes. Clear lungs.  IMPRESSION: Borderline cardiomegaly with right hemidiaphragm elevation. No acute findings.  Original Report Authenticated By: Consuello Bossier, M.D.    EKG: Orders placed during the hospital encounter of 01/05/12  . EKG     Hospital Course: Patient was admitted to [facility] and taken to the OR and underwent the above state procedure without complications.  Patient tolerated the procedure well and was later  transferred to the recovery room and then to the orthopaedic floor for postoperative care.  They were given PO and IV analgesics for pain control following their surgery.  They were given 24 hours of postoperative antibiotics and started on DVT prophylaxis.   PT and OT were ordered for total joint protocol.  Discharge planning consulted to help with postop disposition and equipment needs.  Patient had a good night on the evening of surgery and started to get up with therapy on day one.  PCA was discontinued and they were weaned over to PO meds.  Hemovac drain was pulled without difficulty.  Continued to progress with therapy into day two.  Dressing was changed on day two and the incision was healing well.  By day three, the patient had progressed with therapy and meeting goals.  Incision was healing well.  Patient was seen in rounds and was ready to go home.  Discharge Medications: Prior to Admission medications   Medication Sig Start Date End Date Taking? Authorizing Provider  amLODipine-benazepril (LOTREL) 5-20 MG per capsule Take 1 capsule by mouth daily after breakfast.   Yes Historical Provider, MD  atenolol-chlorthalidone (TENORETIC) 50-25 MG per tablet Take 1 tablet by mouth daily before lunch.  07/20/11  Yes [name], MD  famotidine (PEPCID) 20 MG tablet Take 20 mg by mouth daily.   Yes Historical Provider, MD  rosuvastatin (CRESTOR) 20 MG tablet Take 1 tablet (20 mg total) by mouth at bedtime. 02/24/11 02/24/12 Yes [name], MD  trimethoprim (TRIMPEX) 100 MG tablet Take 100 mg by mouth daily after breakfast.    Yes Historical Provider, MD  allopurinol (ZYLOPRIM) 100 MG tablet Take 100 mg by mouth daily as needed. Gout  10/16/11 10/15/12  [name], MD  methocarbamol (ROBAXIN) 500 MG tablet Take 1 tablet (500 mg total) by mouth every 6 (six) hours as needed. 01/08/12 01/18/12  Davaughn Hillyard, PA  oxyCODONE (OXY IR/ROXICODONE) 5 MG immediate release tablet Take 1-2 tablets (5-10  mg total) by mouth every 4 (four) hours as needed for pain. 01/08/12 01/18/12  Keylah Darwish Julien Girt, PA  rivaroxaban (XARELTO) 10 MG TABS tablet Take 1 tablet (10 mg total) by mouth daily with breakfast. 01/08/12   Ahlana Slaydon Julien Girt, PA    Diet: heart healthy Activity:WBAT Follow-up:in 2 weeks Disposition: Home Discharged Condition: good   Discharge Orders    Future Appointments: Provider: Department: Dept Phone: Center:   02/15/2012 11:15 AM [name], MD [facility] [phone] [facility]     Future Orders Please Complete By Expires   Diet - low sodium heart healthy      Call MD / Call 911      Comments:   If you experience chest pain or shortness of breath, CALL 911 and be transported to the hospital emergency room.  If you develope a fever above 101 F, pus (white drainage) or increased drainage or redness at the wound, or calf pain, call your surgeon's office.   Constipation Prevention  Comments:   Drink plenty of fluids.  Prune juice may be helpful.  You may use a stool softener, such as Colace (over the counter) 100 mg twice a day.  Use MiraLax (over the counter) for constipation as needed.   Increase activity slowly as tolerated      Weight Bearing as taught in Physical Therapy      Comments:   Use a walker or crutches as instructed.   Discharge instructions      Comments:   Pick up stool softner and laxative for home. Do not submerge incision under water. May shower. Continue to use ice for pain and swelling from surgery.    Driving restrictions      Comments:   No driving   Lifting restrictions      Comments:   No lifting   TED hose      Comments:   Use stockings (TED hose) for 3 weeks on both leg(s).  You may remove them at night for sleeping.   Change dressing      Comments:   Change dressing  daily with sterile 4 x 4 inch gauze dressing and apply TED hose.  You may clean the incision with alcohol prior to redressing.   Do not put a pillow under the  knee. Place it under the heel.        Medication List  As of 01/11/2012 11:07 AM   STOP taking these medications         aspirin EC 81 MG tablet      b complex vitamins tablet      naproxen sodium 220 MG tablet         TAKE these medications         allopurinol 100 MG tablet   Commonly known as: ZYLOPRIM   Take 100 mg by mouth daily as needed. Gout        amLODipine-benazepril 5-20 MG per capsule   Commonly known as: LOTREL   Take 1 capsule by mouth daily after breakfast.      atenolol-chlorthalidone 50-25 MG per tablet   Commonly known as: TENORETIC   Take 1 tablet by mouth daily before lunch.      famotidine 20 MG tablet   Commonly known as: PEPCID   Take 20 mg by mouth daily.      methocarbamol 500 MG tablet   Commonly known as: ROBAXIN   Take 1 tablet (500 mg total) by mouth every 6 (six) hours as needed.      oxyCODONE 5 MG immediate release tablet   Commonly known as: Oxy IR/ROXICODONE   Take 1-2 tablets (5-10 mg total) by mouth every 4 (four) hours as needed for pain.      rivaroxaban 10 MG Tabs tablet   Commonly known as: XARELTO   Take 1 tablet (10 mg total) by mouth daily with breakfast.      rosuvastatin 20 MG tablet   Commonly known as: CRESTOR   Take 1 tablet (20 mg total) by mouth at bedtime.      trimethoprim 100 MG tablet   Commonly known as: TRIMPEX   Take 100 mg by mouth daily after breakfast.           Follow-up Information    Follow up with Loanne Drilling, MD. Schedule an appointment as soon as possible for a visit in 2 weeks.   Contact information:   [facility] [address], [address] [place] [address] [phone]  SignedPatrica Duel 01/11/2012, 11:07 AM

## 2012-01-23 NOTE — OR Nursing (Signed)
Chart amended 01-23-12 @ 1540 per Delford Field, RN. Change to chart was update of Procedure Time start and Tourniquet times.

## 2012-01-24 ENCOUNTER — Encounter (HOSPITAL_COMMUNITY): Payer: Self-pay | Admitting: Orthopedic Surgery

## 2012-02-02 ENCOUNTER — Ambulatory Visit (HOSPITAL_COMMUNITY)
Admission: RE | Admit: 2012-02-02 | Discharge: 2012-02-02 | Disposition: A | Discharge status: Home / Self Care | Payer: Medicare Other | Source: Ambulatory Visit | Attending: Orthopedic Surgery | Admitting: Orthopedic Surgery

## 2012-02-02 DIAGNOSIS — R52 Pain, unspecified: Secondary | ICD-10-CM

## 2012-02-02 DIAGNOSIS — M79609 Pain in unspecified limb: Secondary | ICD-10-CM

## 2012-02-02 NOTE — Progress Notes (Signed)
VASCULAR LAB PRELIMINARY  PRELIMINARY  PRELIMINARY  PRELIMINARY  Right lower extremity venous duplex has been completed.    Preliminary report:  Right leg is negative for deep and superficial vein thrombosis. Vanna Scotland,  RVT 02/02/2012, 5:46 PM

## 2012-02-15 ENCOUNTER — Encounter: Payer: Self-pay | Admitting: Internal Medicine

## 2012-02-15 ENCOUNTER — Ambulatory Visit (INDEPENDENT_AMBULATORY_CARE_PROVIDER_SITE_OTHER): Payer: Medicare Other | Admitting: Internal Medicine

## 2012-02-15 VITALS — BP 140/80 | HR 80 | Temp 98.2°F | Resp 16 | Ht 68.5 in | Wt 275.0 lb

## 2012-02-15 DIAGNOSIS — I10 Essential (primary) hypertension: Secondary | ICD-10-CM

## 2012-02-15 DIAGNOSIS — D649 Anemia, unspecified: Secondary | ICD-10-CM

## 2012-02-15 DIAGNOSIS — M1710 Unilateral primary osteoarthritis, unspecified knee: Secondary | ICD-10-CM

## 2012-02-15 LAB — CBC WITH DIFFERENTIAL/PLATELET
Basophils Absolute: 0 10*3/uL (ref 0.0–0.1)
Basophils Relative: 0.5 % (ref 0.0–3.0)
Eosinophils Absolute: 0.4 10*3/uL (ref 0.0–0.7)
Eosinophils Relative: 3.9 % (ref 0.0–5.0)
HCT: 40.6 % (ref 36.0–46.0)
Hemoglobin: 13.4 g/dL (ref 12.0–15.0)
Lymphocytes Relative: 25.5 % (ref 12.0–46.0)
Lymphs Abs: 2.3 10*3/uL (ref 0.7–4.0)
MCHC: 33 g/dL (ref 30.0–36.0)
MCV: 92 fl (ref 78.0–100.0)
Monocytes Absolute: 0.9 10*3/uL (ref 0.1–1.0)
Monocytes Relative: 10.4 % (ref 3.0–12.0)
Neutro Abs: 5.3 10*3/uL (ref 1.4–7.7)
Neutrophils Relative %: 59.7 % (ref 43.0–77.0)
Platelets: 281 10*3/uL (ref 150.0–400.0)
RBC: 4.41 Mil/uL (ref 3.87–5.11)
RDW: 14 % (ref 11.5–14.6)
WBC: 8.9 10*3/uL (ref 4.5–10.5)

## 2012-02-15 LAB — LIPID PANEL
Cholesterol: 283 mg/dL — ABNORMAL HIGH (ref 0–200)
HDL: 39.7 mg/dL (ref >39.00)
Total CHOL/HDL Ratio: 7
Triglycerides: 223 mg/dL — ABNORMAL HIGH (ref 0.0–149.0)
VLDL: 44.6 mg/dL — ABNORMAL HIGH (ref 0.0–40.0)

## 2012-02-15 LAB — BASIC METABOLIC PANEL
BUN: 20 mg/dL (ref 6–23)
CO2: 27 mEq/L (ref 19–32)
Calcium: 9.7 mg/dL (ref 8.4–10.5)
Chloride: 100 mEq/L (ref 96–112)
Creatinine, Ser: 1.2 mg/dL (ref 0.4–1.2)
GFR: 58.85 mL/min — ABNORMAL LOW (ref >60.00)
Glucose, Bld: 89 mg/dL (ref 70–99)
Potassium: 3.8 mEq/L (ref 3.5–5.1)
Sodium: 137 mEq/L (ref 135–145)

## 2012-02-15 LAB — LDL CHOLESTEROL, DIRECT: Direct LDL: 204.8 mg/dL

## 2012-02-15 NOTE — Patient Instructions (Signed)
The patient is instructed to continue all medications as prescribed. Schedule followup with check out clerk upon leaving the clinic  

## 2012-02-15 NOTE — Progress Notes (Signed)
Subjective:    Patient ID: [name], female    DOB: 12/23/1940, 71 y.o.   MRN: [id number]  HPI  Patient is a 71 year old female who presents for followup of hypertension and recent replacement of her right knee.  She has done well with her knee replacement placement looks well the incision is well-healed and she is in physical therapy currently.  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  The carpal tunnel was treated with oral anti-inflammatory and a wrist brace and has responded to that therapy at this time other alternatives for her treatment may include injection and her carpal tunnel or surgery to put off surgery to the last choice.  I do believe however she will be requiring knee replacement of her left knee in the next year or so.  Physical therapy should help with both  Review of Systems  Constitutional: Positive for appetite change and fatigue. Negative for activity change.  HENT: Negative for ear pain, congestion, neck pain, postnasal drip and sinus pressure.   Eyes: Negative for redness and visual disturbance.  Respiratory: Negative for cough, shortness of breath and wheezing.   Gastrointestinal: Negative for abdominal pain and abdominal distention.  Genitourinary: Negative for dysuria, frequency and menstrual problem.  Musculoskeletal: Positive for back pain, joint swelling and gait problem. Negative for myalgias and arthralgias.  Skin: Negative for rash and wound.  Neurological: Positive for tremors and weakness. Negative for dizziness and headaches.  Hematological: Negative for adenopathy. Does not bruise/bleed easily.  Psychiatric/Behavioral: Negative for sleep disturbance and decreased concentration.   Past Medical History  Diagnosis Date  . Hyperlipidemia   . Hypertension   . Obesity   . Diverticulitis hx of  . Diverticulosis   .  Abdominal pain   . Constipation   . Colon adenoma   . Complication of anesthesia 2006-at [facility]    breathing problems-no BP med given prior  . Arthritis   . GERD (gastroesophageal reflux disease)   . Pneumonia 2005  . Numbness and tingling in right hand     pt. states has numbness of right hand very frequently-watch positioning    History   Social History  . Marital Status: Married    Spouse Name: N/A    Number of Children: N/A  . Years of Education: N/A   Occupational History  . Not on file.   Social History Main Topics  . Smoking status: Never Smoker   . Smokeless tobacco: Not on file  . Alcohol Use: No  . Drug Use: No  . Sexually Active: Yes   Other Topics Concern  . Not on file   Social History Narrative  . No narrative on file    Past Surgical History  Procedure Date  . Skin draft for trauma   . Abdominal hysterectomy   . Bladder tack   . Colonoscopy [id number]   . Oophorectomy   . Ureter repair for tyransected left ureter   . Temporary ureter stent   . Total knee arthroplasty 01/05/2012    Procedure: TOTAL KNEE ARTHROPLASTY;  Surgeon: Loanne Drilling, MD;  Location: [facility];  Service: Orthopedics;  Laterality: Right;    Family History  Problem Relation Age of Onset  . Breast cancer Mother   . Prostate cancer Father   . Colon cancer Father     Allergies  Allergen Reactions  . Lipitor (Atorvastatin Calcium)  Itching    Current Outpatient Prescriptions on File Prior to Visit  Medication Sig Dispense Refill  . allopurinol (ZYLOPRIM) 100 MG tablet Take 100 mg by mouth daily as needed. Gout       . amLODipine-benazepril (LOTREL) 5-20 MG per capsule Take 1 capsule by mouth daily after breakfast.      . atenolol-chlorthalidone (TENORETIC) 50-25 MG per tablet Take 1 tablet by mouth daily before lunch.       . famotidine (PEPCID) 20 MG tablet Take 20 mg by mouth daily.      . rivaroxaban (XARELTO) 10 MG TABS tablet Take 1 tablet (10 mg total) by  mouth daily with breakfast.  18 tablet  0  . rosuvastatin (CRESTOR) 20 MG tablet Take 1 tablet (20 mg total) by mouth at bedtime.  30 tablet  11  . trimethoprim (TRIMPEX) 100 MG tablet Take 100 mg by mouth daily after breakfast.         BP 140/80  Pulse 80  Temp 98.2 F (36.8 C)  Resp 16  Ht 5' 8.5" (1.74 m)  Wt 275 lb (124.739 kg)  BMI 41.21 kg/m2       Objective:   Physical Exam  Nursing note and vitals reviewed. Constitutional: She is oriented to person, place, and time. She appears well-developed and well-nourished. No distress.  HENT:  Head: Normocephalic and atraumatic.  Right Ear: External ear normal.  Left Ear: External ear normal.  Nose: Nose normal.  Mouth/Throat: Oropharynx is clear and moist.  Eyes: Conjunctivae and EOM are normal. Pupils are equal, round, and reactive to light.  Neck: Normal range of motion. Neck supple. No JVD present. No tracheal deviation present. No thyromegaly present.  Cardiovascular: Normal rate, regular rhythm, normal heart sounds and intact distal pulses.   No murmur heard. Pulmonary/Chest: Effort normal and breath sounds normal. She has no wheezes. She exhibits no tenderness.  Abdominal: Soft. Bowel sounds are normal.  Musculoskeletal: She exhibits no edema and no tenderness.  Lymphadenopathy:    She has no cervical adenopathy.  Neurological: She is alert and oriented to person, place, and time. She has normal reflexes. No cranial nerve deficit.  Skin: Skin is warm and dry. She is not diaphoretic.  Psychiatric: She has a normal mood and affect. Her behavior is normal.          Assessment & Plan:  Done well with her knee replacement She is continuing physical therapy she is continued gradual weight loss her blood pressure stable her current medication.  We will look at her renal function and CBC today and see her back in 2 months time for monitoring of her progress with weight loss and with the mobility with her knee she will be  a candidate for injections in her left knee if she wished to delay the inevitable knee replacement we took him to consider Synvisc injections in the knee to buy her time while she continues to exercise and lose weight

## 2012-02-22 NOTE — Progress Notes (Signed)
Pt informed

## 2012-04-22 ENCOUNTER — Ambulatory Visit: Payer: Medicare Other | Admitting: Internal Medicine

## 2012-04-24 ENCOUNTER — Ambulatory Visit (INDEPENDENT_AMBULATORY_CARE_PROVIDER_SITE_OTHER): Payer: Medicare Other | Admitting: Internal Medicine

## 2012-04-24 ENCOUNTER — Encounter: Payer: Self-pay | Admitting: Internal Medicine

## 2012-04-24 VITALS — BP 126/80 | HR 76 | Temp 98.6°F | Resp 16 | Ht 68.5 in | Wt 280.0 lb

## 2012-04-24 DIAGNOSIS — M1990 Unspecified osteoarthritis, unspecified site: Secondary | ICD-10-CM

## 2012-04-24 DIAGNOSIS — Z6841 Body Mass Index (BMI) 40.0 and over, adult: Secondary | ICD-10-CM

## 2012-04-24 DIAGNOSIS — I10 Essential (primary) hypertension: Secondary | ICD-10-CM

## 2012-04-24 NOTE — Progress Notes (Signed)
Subjective:    Patient ID: [name], female    DOB: 1941/03/08, 71 y.o.   MRN: [id number]  HPI Pt follow up for lipids, htn and follow up from surgery by Dr Logan Bores The surgeon commented on the urethra repair abdominal distention and gas   Review of Systems  Constitutional: Positive for fatigue. Negative for activity change and appetite change.  HENT: Positive for congestion. Negative for ear pain, neck pain, postnasal drip and sinus pressure.   Eyes: Negative for redness and visual disturbance.  Respiratory: Negative for cough, shortness of breath and wheezing.   Cardiovascular: Positive for leg swelling.  Gastrointestinal: Positive for abdominal distention. Negative for abdominal pain.  Genitourinary: Positive for frequency. Negative for dysuria and menstrual problem.  Musculoskeletal: Negative for myalgias, joint swelling and arthralgias.  Skin: Negative for rash and wound.  Neurological: Positive for weakness. Negative for dizziness and headaches.  Hematological: Negative for adenopathy. Does not bruise/bleed easily.  Psychiatric/Behavioral: Negative for disturbed wake/sleep cycle and decreased concentration.   Past Medical History  Diagnosis Date  . Hyperlipidemia   . Hypertension   . Obesity   . Diverticulitis hx of  . Diverticulosis   . Abdominal pain   . Constipation   . Colon adenoma   . Complication of anesthesia 2006-at [facility]    breathing problems-no BP med given prior  . Arthritis   . GERD (gastroesophageal reflux disease)   . Pneumonia 2005  . Numbness and tingling in right hand     pt. states has numbness of right hand very frequently-watch positioning    History   Social History  . Marital Status: Married    Spouse Name: N/A    Number of Children: N/A  . Years of Education: N/A   Occupational History  . Not on file.   Social History Main Topics  . Smoking status: Never Smoker   . Smokeless tobacco: Not on file  . Alcohol Use: No  . Drug  Use: No  . Sexually Active: Yes   Other Topics Concern  . Not on file   Social History Narrative  . No narrative on file    Past Surgical History  Procedure Date  . Skin draft for trauma   . Abdominal hysterectomy   . Bladder tack   . Colonoscopy [id number]   . Oophorectomy   . Ureter repair for tyransected left ureter   . Temporary ureter stent   . Total knee arthroplasty 01/05/2012    Procedure: TOTAL KNEE ARTHROPLASTY;  Surgeon: Loanne Drilling, MD;  Location: [facility];  Service: Orthopedics;  Laterality: Right;    Family History  Problem Relation Age of Onset  . Breast cancer Mother   . Prostate cancer Father   . Colon cancer Father     Allergies  Allergen Reactions  . Lipitor (Atorvastatin Calcium) Itching    Current Outpatient Prescriptions on File Prior to Visit  Medication Sig Dispense Refill  . amLODipine-benazepril (LOTREL) 5-20 MG per capsule Take 1 capsule by mouth daily after breakfast.      . atenolol-chlorthalidone (TENORETIC) 50-25 MG per tablet Take 1 tablet by mouth daily before lunch.       . famotidine (PEPCID) 20 MG tablet Take 20 mg by mouth daily.      . rivaroxaban (XARELTO) 10 MG TABS tablet Take 1 tablet (10 mg total) by mouth daily with breakfast.  18 tablet  0  . DISCONTD: rosuvastatin (CRESTOR) 20 MG tablet Take 1 tablet (20 mg total) by  mouth at bedtime.  30 tablet  11    BP 126/80  Pulse 76  Temp 98.6 F (37 C)  Resp 16  Ht 5' 8.5" (1.74 m)  Wt 280 lb (127.007 kg)  BMI 41.95 kg/m2        Objective:   Physical Exam  Nursing note and vitals reviewed. Constitutional: She is oriented to person, place, and time. She appears well-developed and well-nourished. No distress.  HENT:  Head: Normocephalic and atraumatic.  Right Ear: External ear normal.  Left Ear: External ear normal.  Nose: Nose normal.  Mouth/Throat: Oropharynx is clear and moist.  Eyes: Conjunctivae and EOM are normal. Pupils are equal, round, and reactive to  light.  Neck: Normal range of motion. Neck supple. No JVD present. No tracheal deviation present. No thyromegaly present.  Cardiovascular: Normal rate, regular rhythm and intact distal pulses.   Murmur heard. Pulmonary/Chest: Effort normal and breath sounds normal. She has no wheezes. She exhibits no tenderness.  Abdominal: Soft. Bowel sounds are normal.  Musculoskeletal: Normal range of motion. She exhibits edema and tenderness.  Lymphadenopathy:    She has no cervical adenopathy.  Neurological: She is alert and oriented to person, place, and time. She has normal reflexes. No cranial nerve deficit.  Skin: Skin is warm and dry. She is not diaphoretic.  Psychiatric: She has a normal mood and affect. Her behavior is normal.          Assessment & Plan:  Discussed the diet and reviewed the principles of a paleo HTN stable

## 2012-04-24 NOTE — Patient Instructions (Signed)
The patient is instructed to continue all medications as prescribed. Schedule followup with check out clerk upon leaving the clinic  

## 2012-05-25 ENCOUNTER — Encounter (HOSPITAL_COMMUNITY): Payer: Self-pay | Admitting: Emergency Medicine

## 2012-05-25 ENCOUNTER — Emergency Department (HOSPITAL_COMMUNITY)
Admission: EM | Admit: 2012-05-25 | Discharge: 2012-05-25 | Disposition: A | Discharge status: Home / Self Care | Payer: Medicare Other | Attending: Emergency Medicine | Admitting: Emergency Medicine

## 2012-05-25 ENCOUNTER — Emergency Department (HOSPITAL_COMMUNITY): Payer: Medicare Other

## 2012-05-25 DIAGNOSIS — K219 Gastro-esophageal reflux disease without esophagitis: Secondary | ICD-10-CM | POA: Insufficient documentation

## 2012-05-25 DIAGNOSIS — I10 Essential (primary) hypertension: Secondary | ICD-10-CM | POA: Insufficient documentation

## 2012-05-25 DIAGNOSIS — M25561 Pain in right knee: Secondary | ICD-10-CM

## 2012-05-25 DIAGNOSIS — E785 Hyperlipidemia, unspecified: Secondary | ICD-10-CM | POA: Insufficient documentation

## 2012-05-25 DIAGNOSIS — M25469 Effusion, unspecified knee: Secondary | ICD-10-CM | POA: Insufficient documentation

## 2012-05-25 DIAGNOSIS — W19XXXA Unspecified fall, initial encounter: Secondary | ICD-10-CM

## 2012-05-25 DIAGNOSIS — M25562 Pain in left knee: Secondary | ICD-10-CM

## 2012-05-25 DIAGNOSIS — W108XXA Fall (on) (from) other stairs and steps, initial encounter: Secondary | ICD-10-CM | POA: Insufficient documentation

## 2012-05-25 DIAGNOSIS — Z79899 Other long term (current) drug therapy: Secondary | ICD-10-CM | POA: Insufficient documentation

## 2012-05-25 DIAGNOSIS — M25569 Pain in unspecified knee: Secondary | ICD-10-CM | POA: Insufficient documentation

## 2012-05-25 DIAGNOSIS — Z96659 Presence of unspecified artificial knee joint: Secondary | ICD-10-CM | POA: Insufficient documentation

## 2012-05-25 DIAGNOSIS — M79609 Pain in unspecified limb: Secondary | ICD-10-CM | POA: Insufficient documentation

## 2012-05-25 DIAGNOSIS — M79601 Pain in right arm: Secondary | ICD-10-CM

## 2012-05-25 DIAGNOSIS — M25519 Pain in unspecified shoulder: Secondary | ICD-10-CM | POA: Insufficient documentation

## 2012-05-25 LAB — URINE MICROSCOPIC-ADD ON

## 2012-05-25 LAB — URINALYSIS, ROUTINE W REFLEX MICROSCOPIC
Bilirubin Urine: NEGATIVE
Glucose, UA: NEGATIVE mg/dL
Hgb urine dipstick: NEGATIVE
Ketones, ur: NEGATIVE mg/dL
Nitrite: NEGATIVE
Protein, ur: NEGATIVE mg/dL
Specific Gravity, Urine: 1.019 (ref 1.005–1.030)
Urobilinogen, UA: 0.2 mg/dL (ref 0.0–1.0)
pH: 5.5 (ref 5.0–8.0)

## 2012-05-25 IMAGING — CR DG KNEE COMPLETE 4+V*L*
1 series · 1 of 1 positions shown · non-contrast
Comparison: None

CLINICAL DATA: Fell.  Left knee pain.

LEFT KNEE - COMPLETE 4+ VIEW

[t knee ap left]
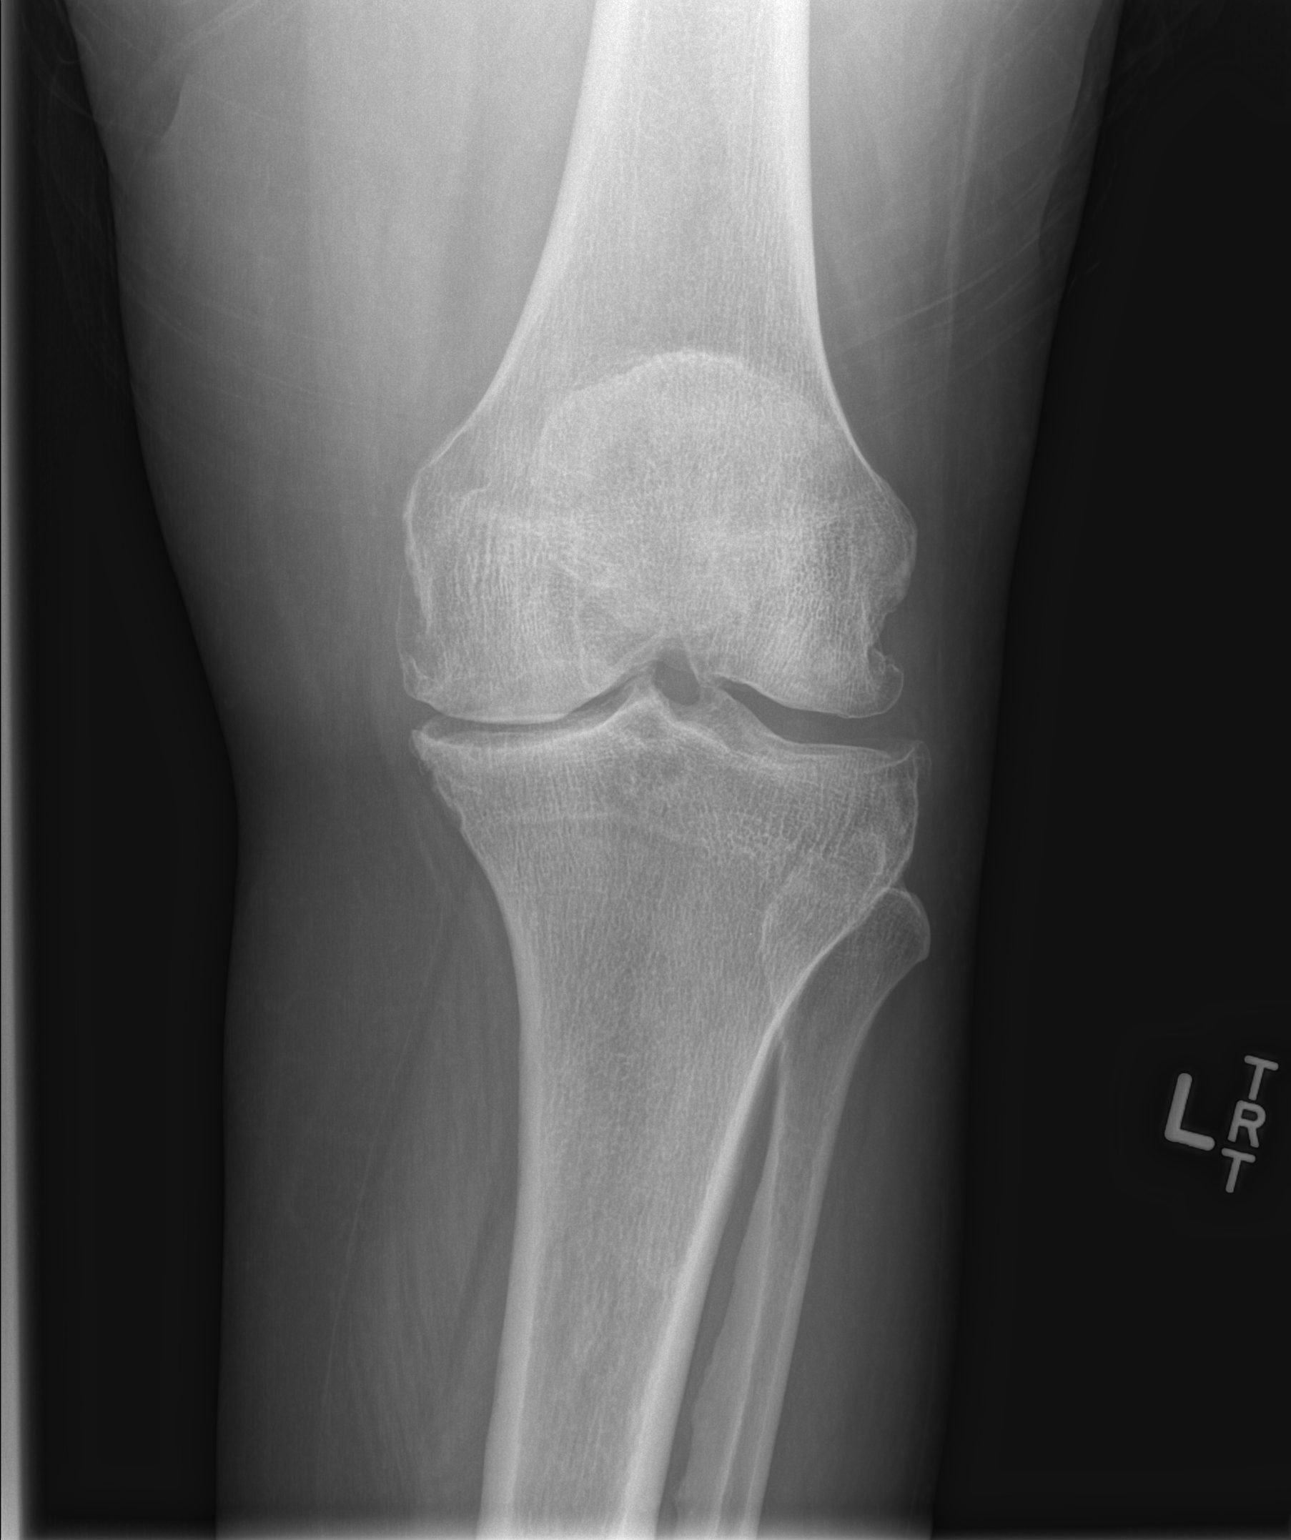

[1 of 1 positions shown; findings below may reference images not displayed]

FINDINGS: Advanced tricompartmental degenerative changes with joint
space narrowing and osteophytic spurring.  No acute fracture or
osteochondral lesion.  There is a small joint effusion.
IMPRESSION: 1.  Tricompartmental degenerative changes.
2.  No acute fracture.
3.  Small joint effusion.

## 2012-05-25 IMAGING — CR DG SHOULDER 2+V*R*
4 series · 4 of 4 positions shown · non-contrast
Comparison: [DATE].

CLINICAL DATA: Fell.  Right shoulder pain.

RIGHT SHOULDER - 2+ VIEW

[t shoulder internal right]
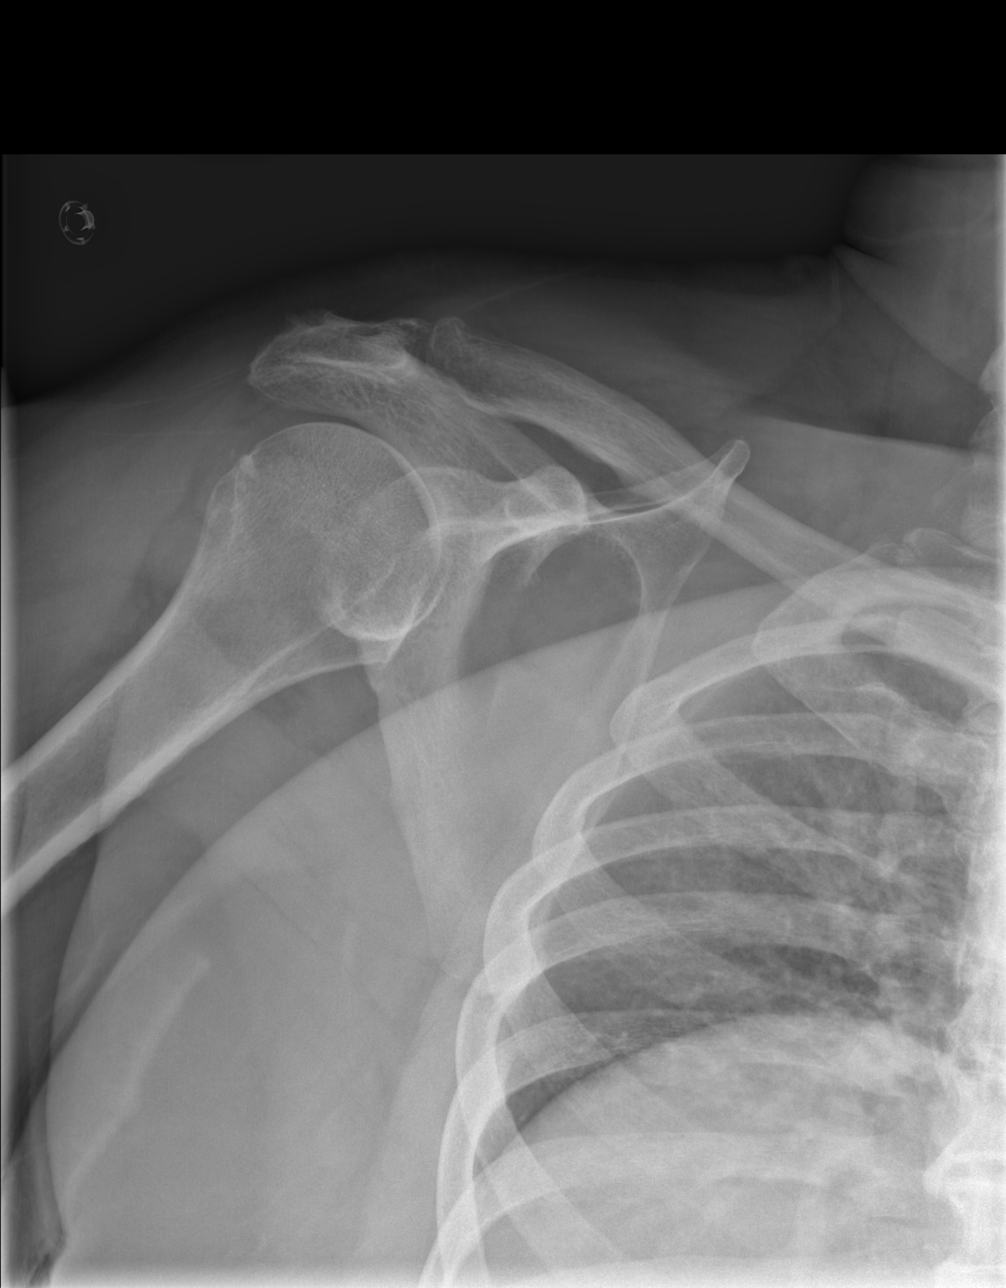

[t shoulder external right]
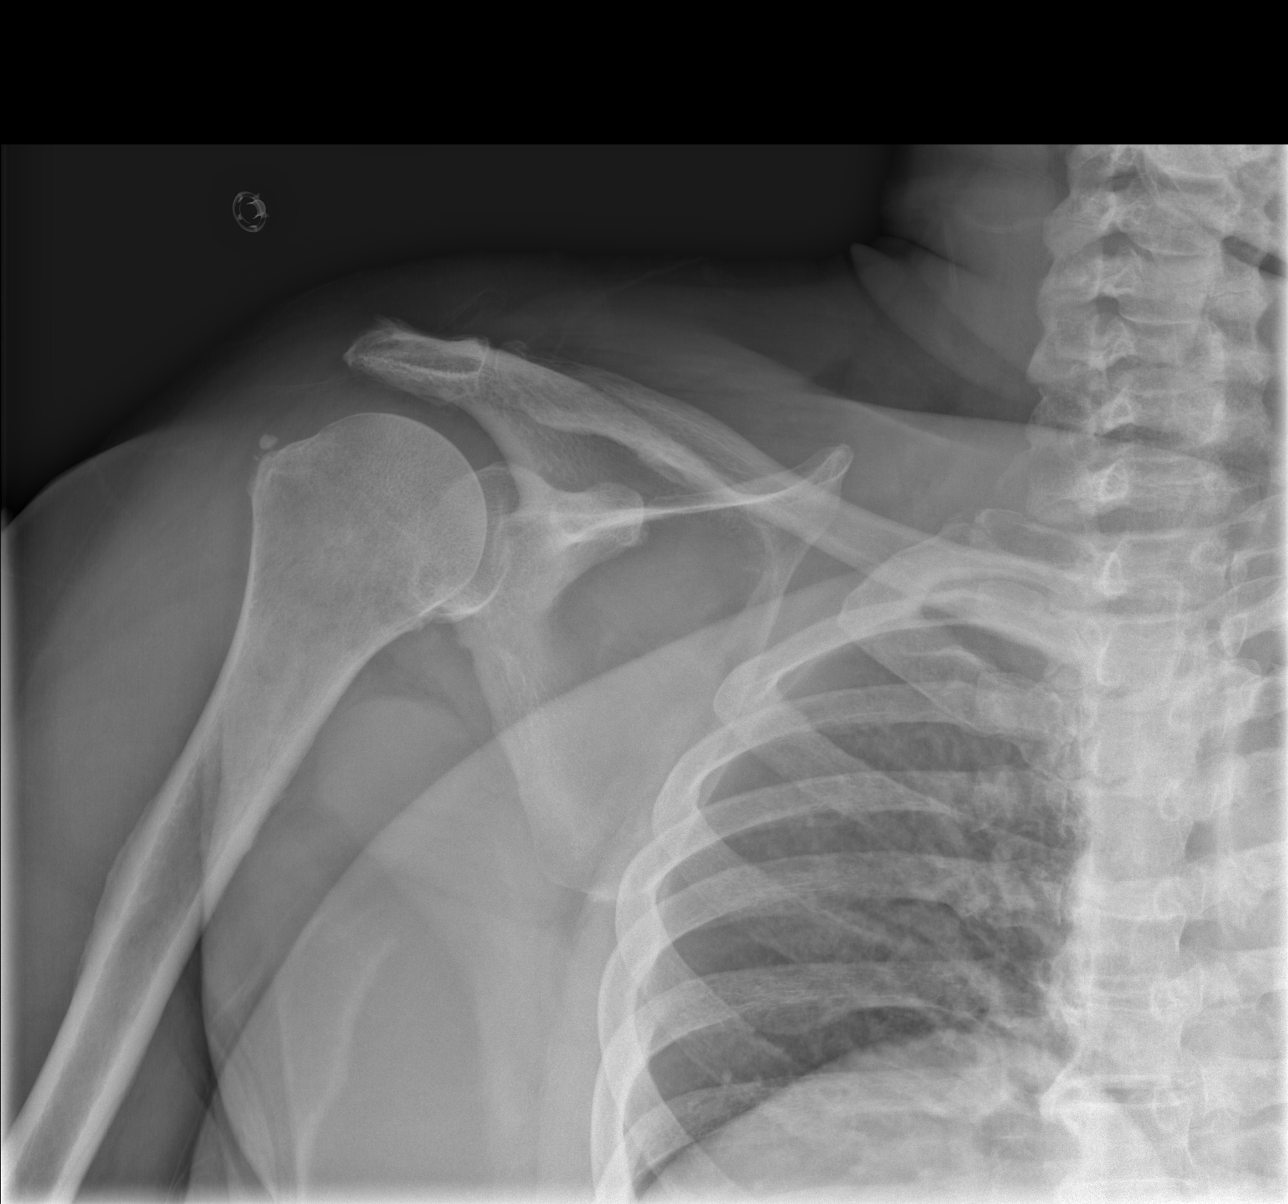

[t shoulder y-view right (1 of 2)]
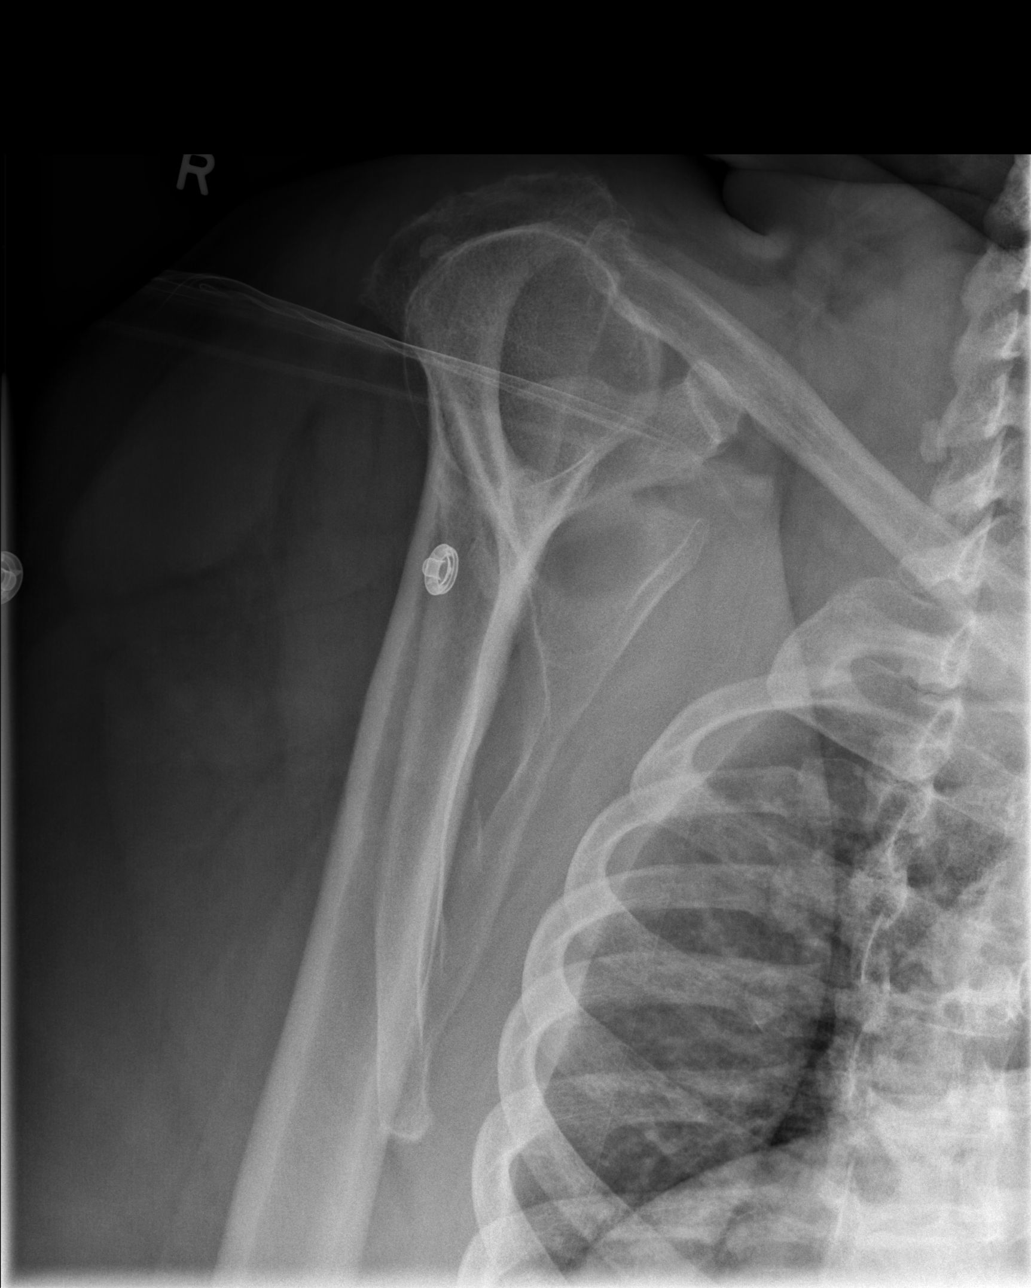

[t shoulder y-view right (2 of 2)]
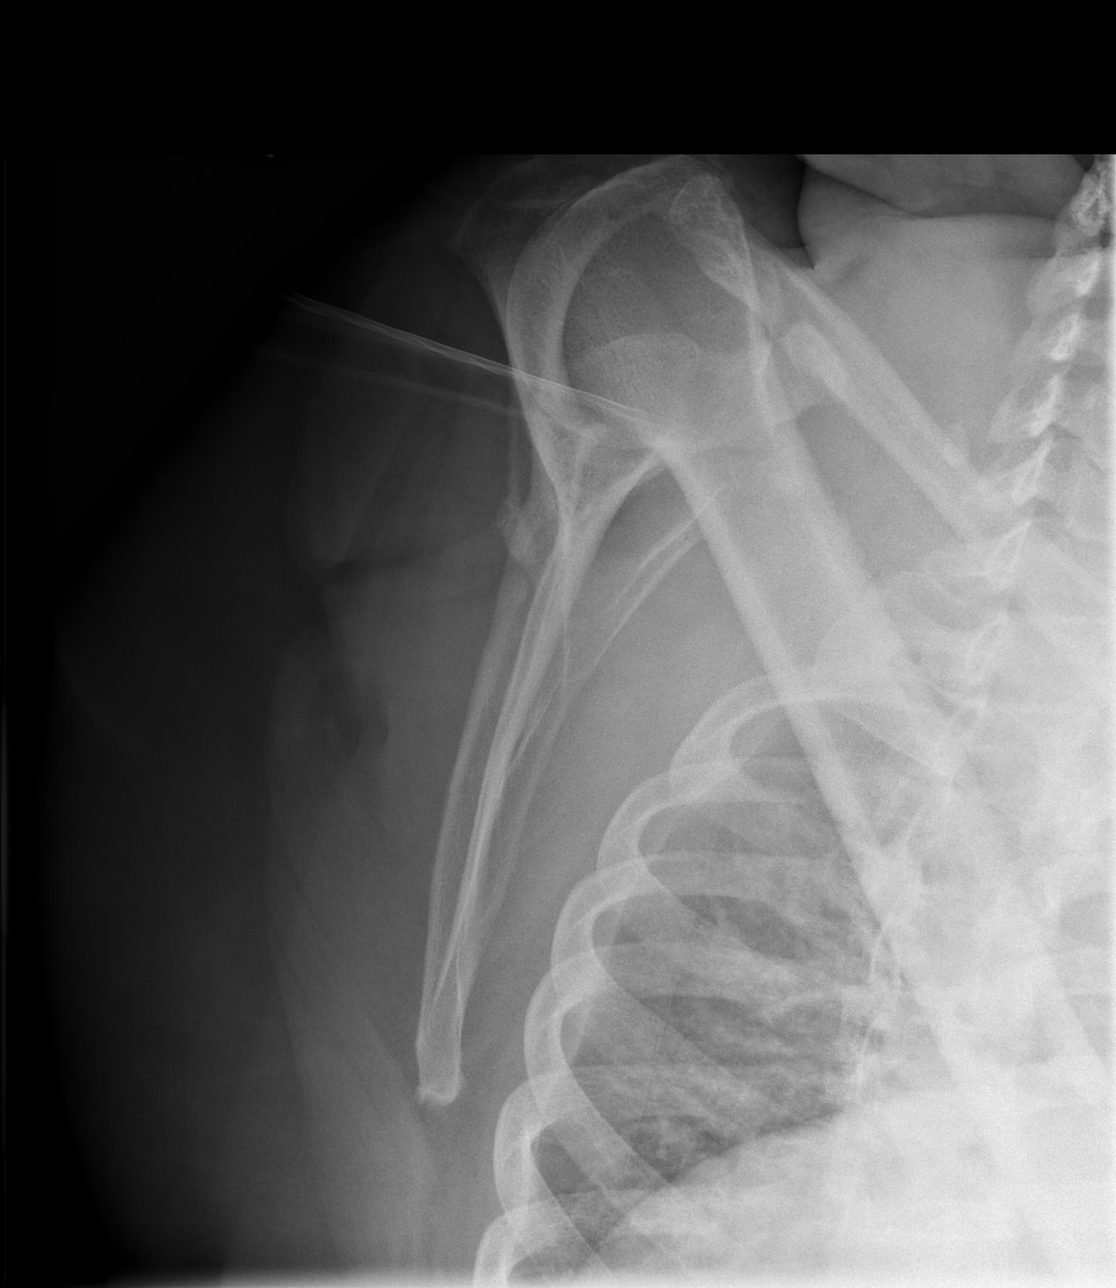

[4 of 4 positions shown; findings below may reference images not displayed]

FINDINGS: Stable mild AC joint generative changes.  Stable changes
of calcific tendonitis.  No acute fracture or dislocation.  The
right lung apex is clear.
IMPRESSION: No fracture or dislocation.

## 2012-05-25 IMAGING — CR DG KNEE COMPLETE 4+V*L*
1 series · 1 of 1 positions shown · non-contrast
Comparison: None

CLINICAL DATA: Fell.  Left knee pain.

LEFT KNEE - COMPLETE 4+ VIEW

[t knee lat left]
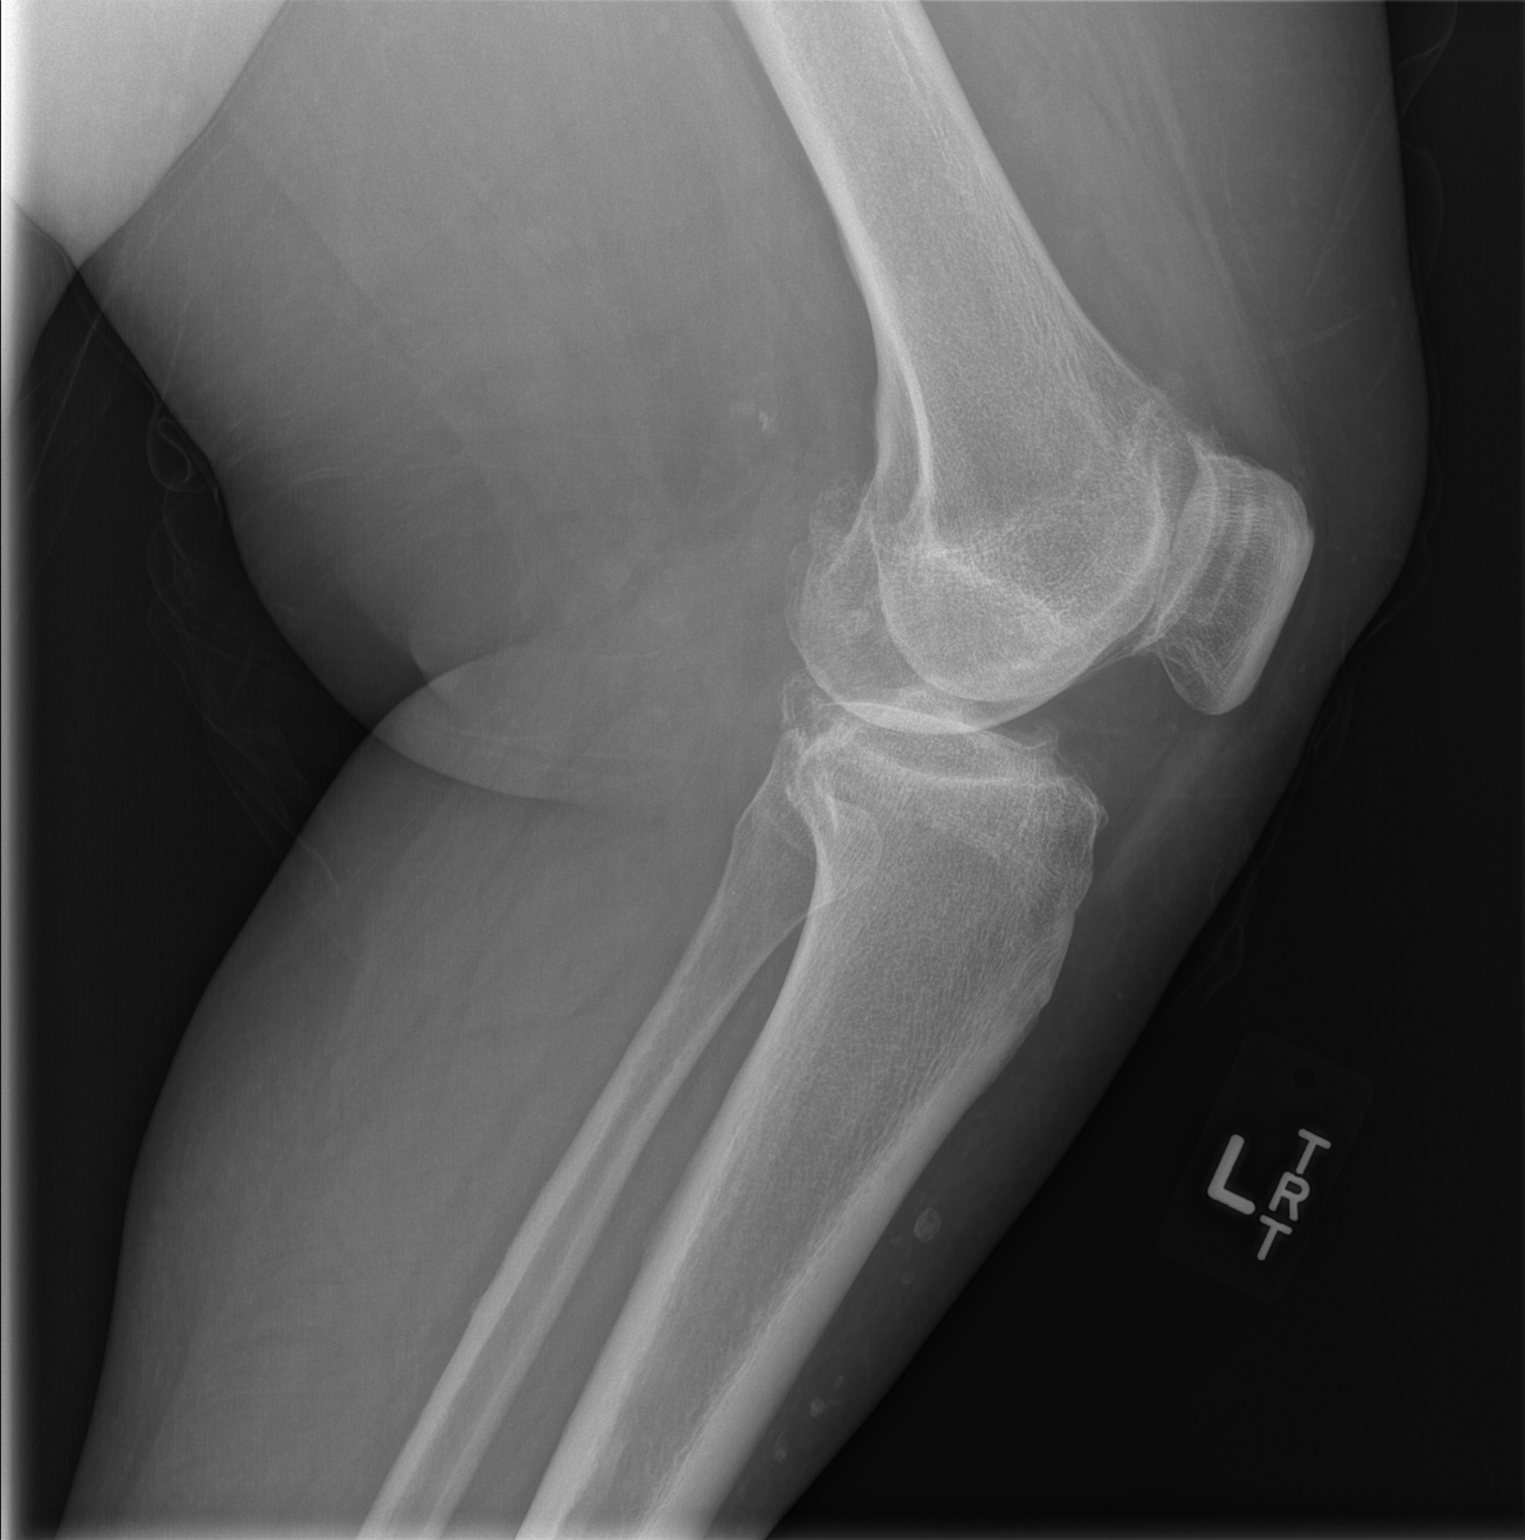

[1 of 1 positions shown; findings below may reference images not displayed]

FINDINGS: Advanced tricompartmental degenerative changes with joint
space narrowing and osteophytic spurring.  No acute fracture or
osteochondral lesion.  There is a small joint effusion.
IMPRESSION: 1.  Tricompartmental degenerative changes.
2.  No acute fracture.
3.  Small joint effusion.

## 2012-05-25 IMAGING — CR DG KNEE COMPLETE 4+V*R*
3 series · 3 of 3 positions shown · non-contrast
Comparison: [DATE].

CLINICAL DATA: Right knee pain.  Fell.

RIGHT KNEE - COMPLETE 4+ VIEW

[t knee ap right]
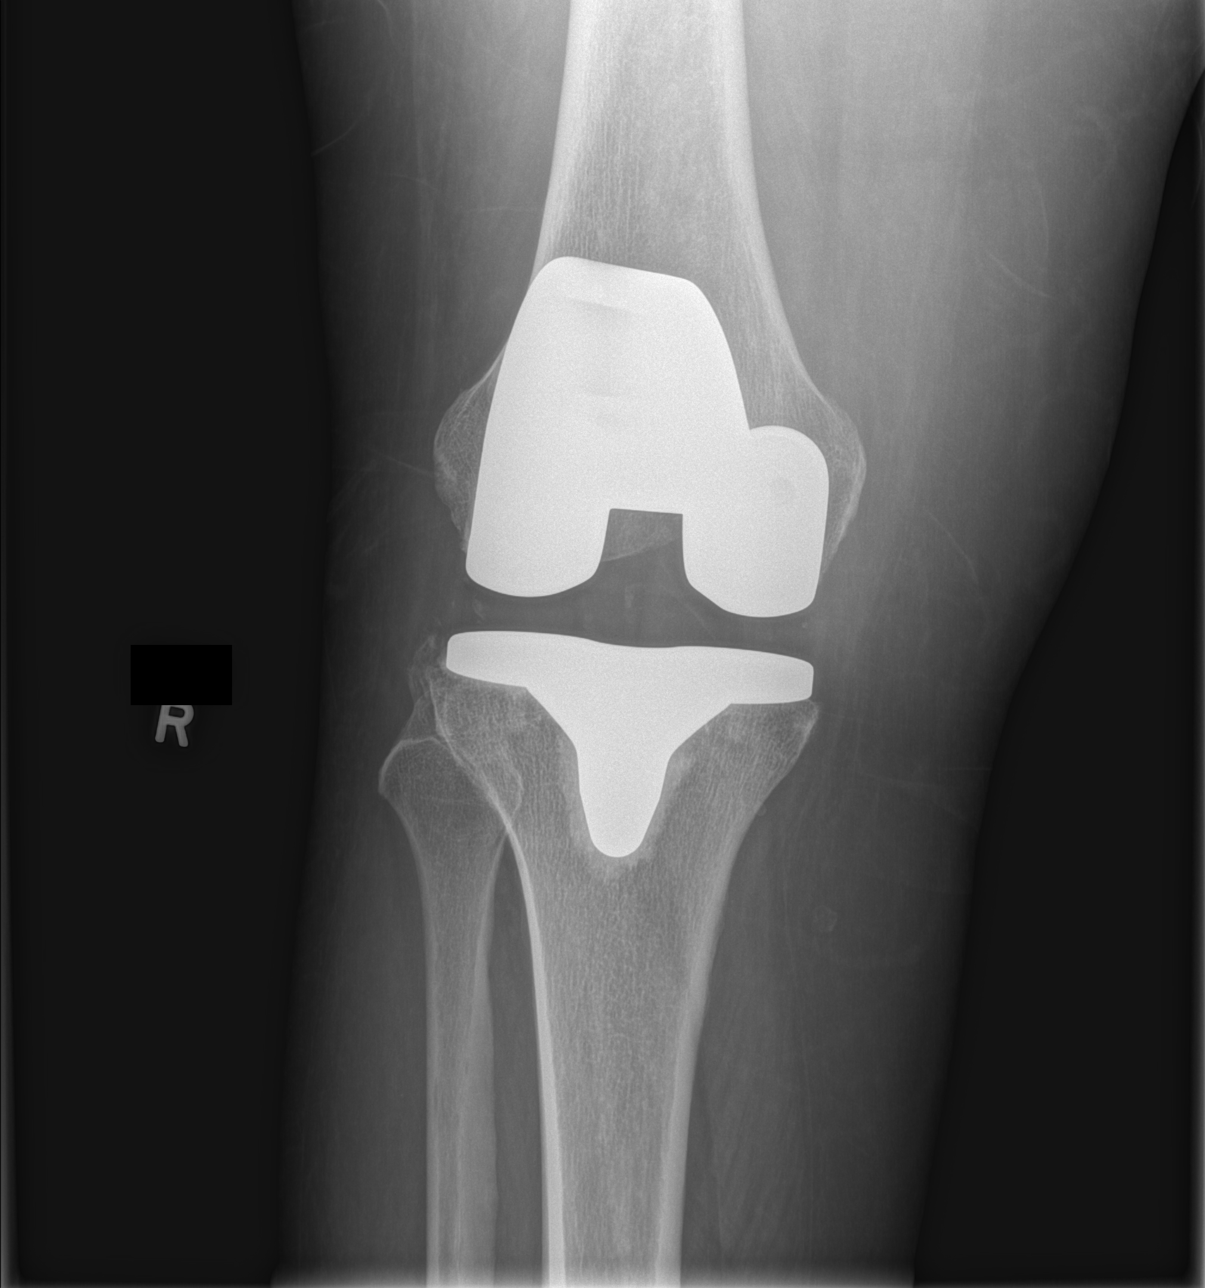

[t knee obl right (1 of 2)]
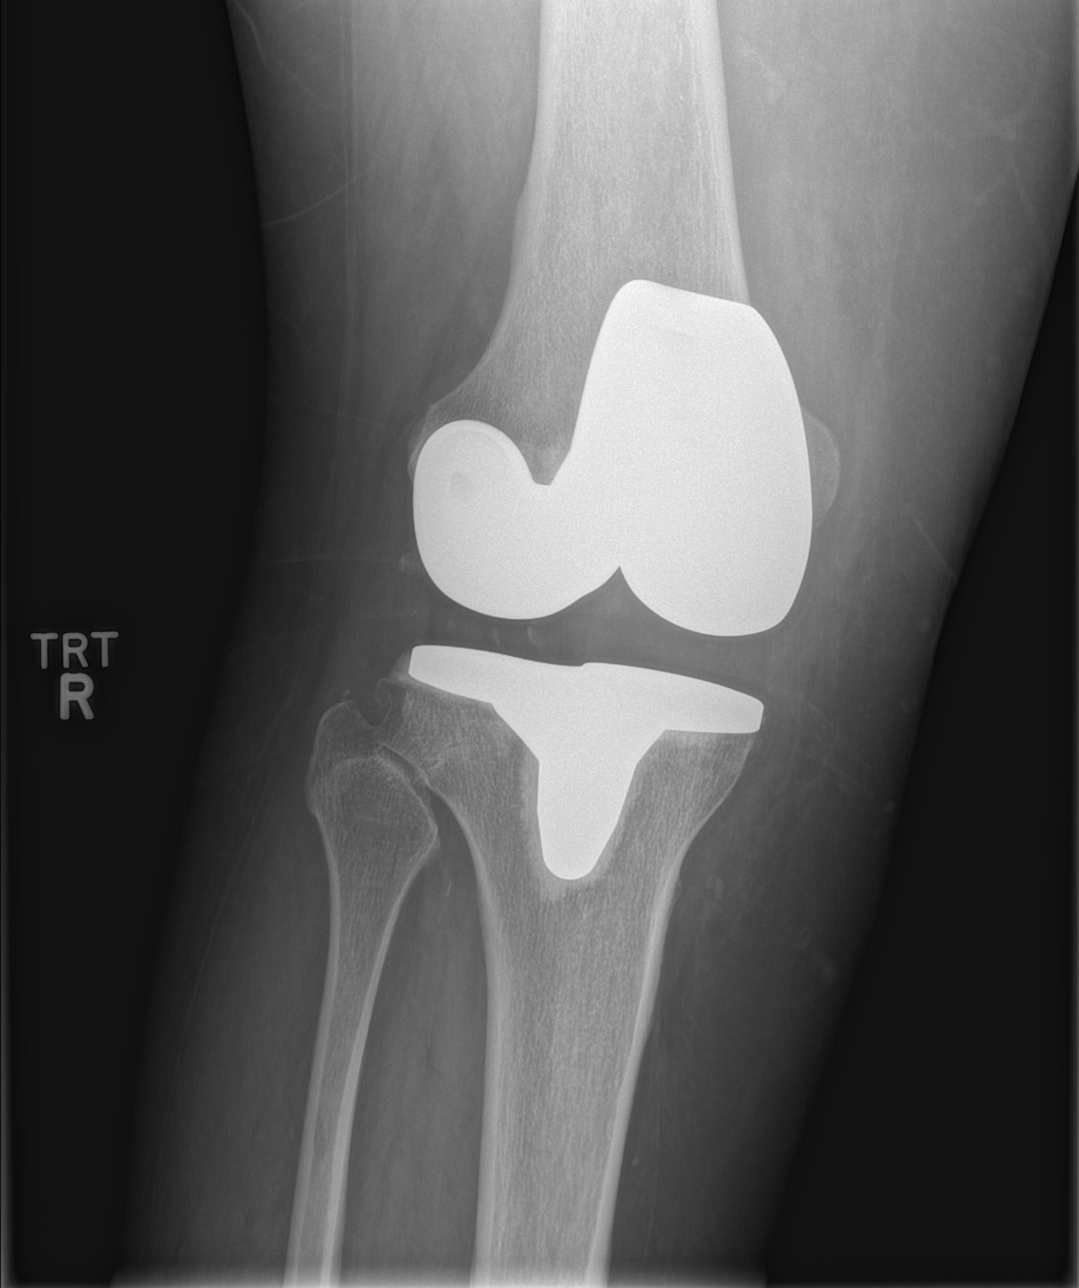

[t knee obl right (2 of 2)]
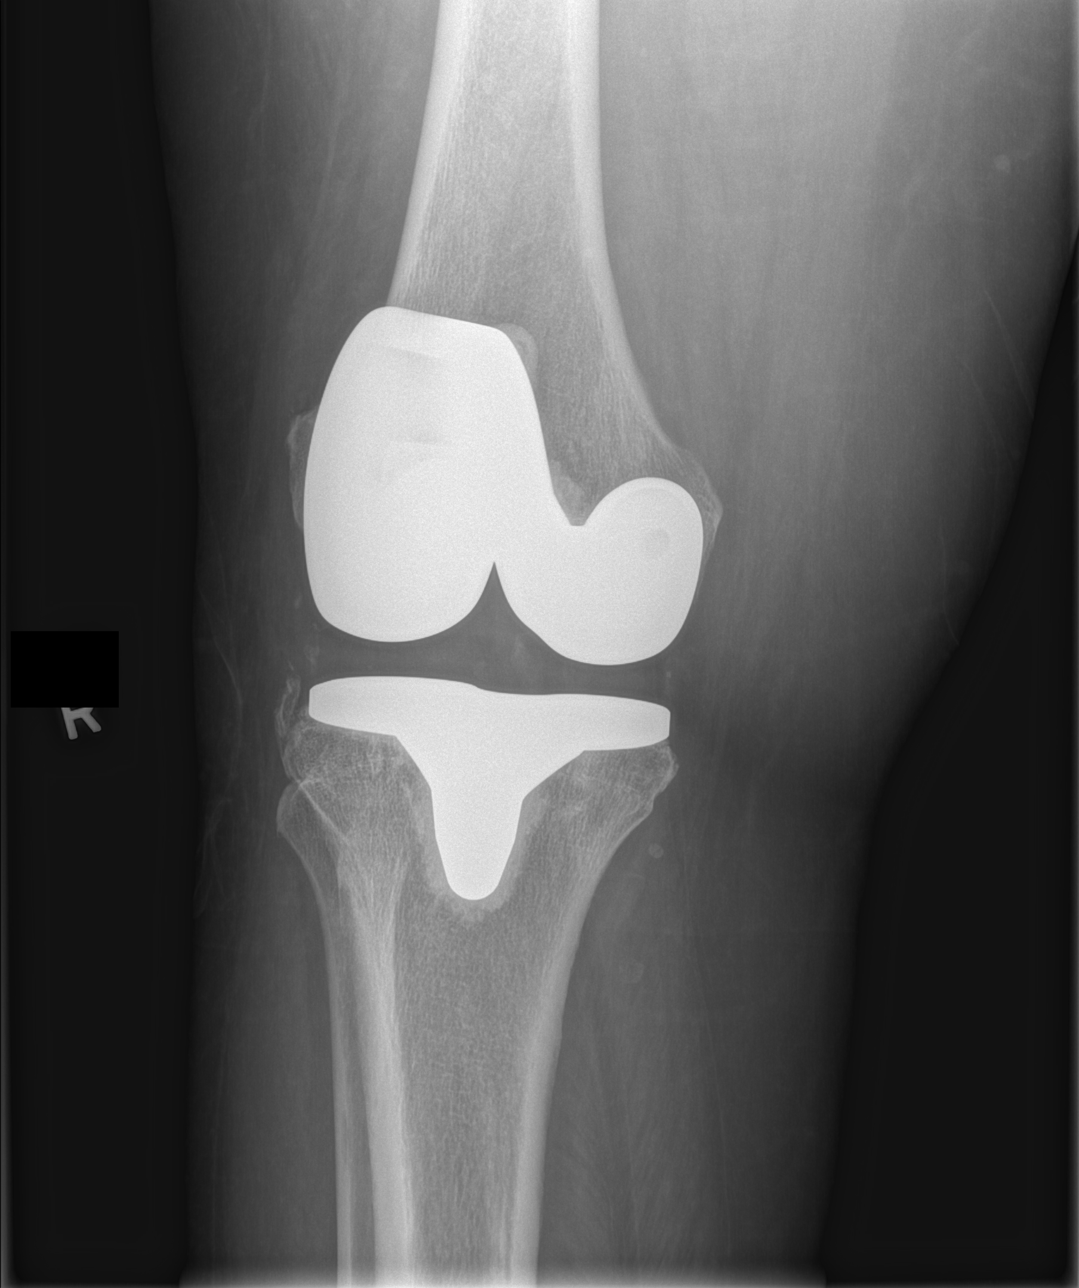

[3 of 3 positions shown; findings below may reference images not displayed]

FINDINGS: The tibial and femoral components appear well seated.  No
complicating features.  No acute fracture.  No joint effusion.
IMPRESSION: No acute bony findings.

## 2012-05-25 IMAGING — CR DG KNEE COMPLETE 4+V*L*
1 series · 1 of 1 positions shown · non-contrast
Comparison: None

CLINICAL DATA: Fell.  Left knee pain.

LEFT KNEE - COMPLETE 4+ VIEW

[t knee obl left]
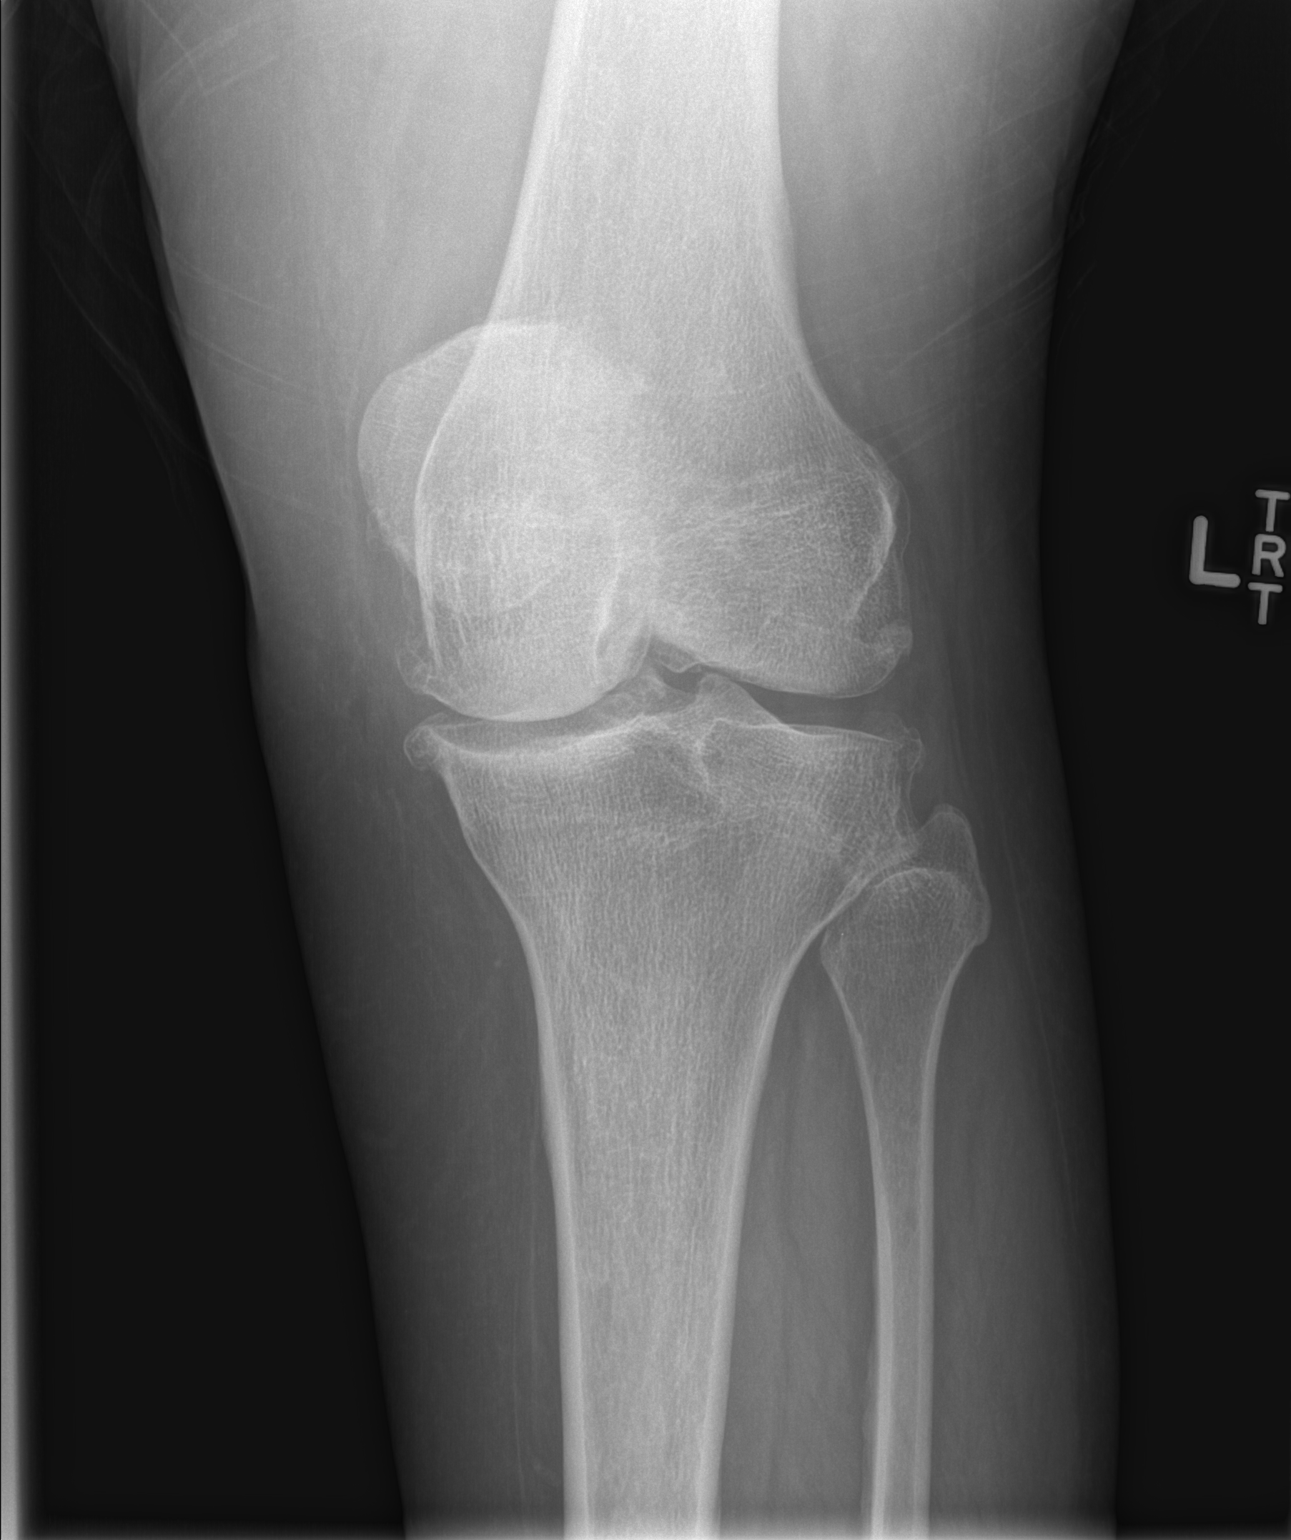

[1 of 1 positions shown; findings below may reference images not displayed]

FINDINGS: Advanced tricompartmental degenerative changes with joint
space narrowing and osteophytic spurring.  No acute fracture or
osteochondral lesion.  There is a small joint effusion.
IMPRESSION: 1.  Tricompartmental degenerative changes.
2.  No acute fracture.
3.  Small joint effusion.

## 2012-05-25 IMAGING — CR DG HIP COMPLETE 2+V*R*
3 series · 3 of 3 positions shown · non-contrast
Comparison: None.

CLINICAL DATA: Fall, right lateral pain

RIGHT HIP - COMPLETE 2+ VIEW

[t pelvis ap]
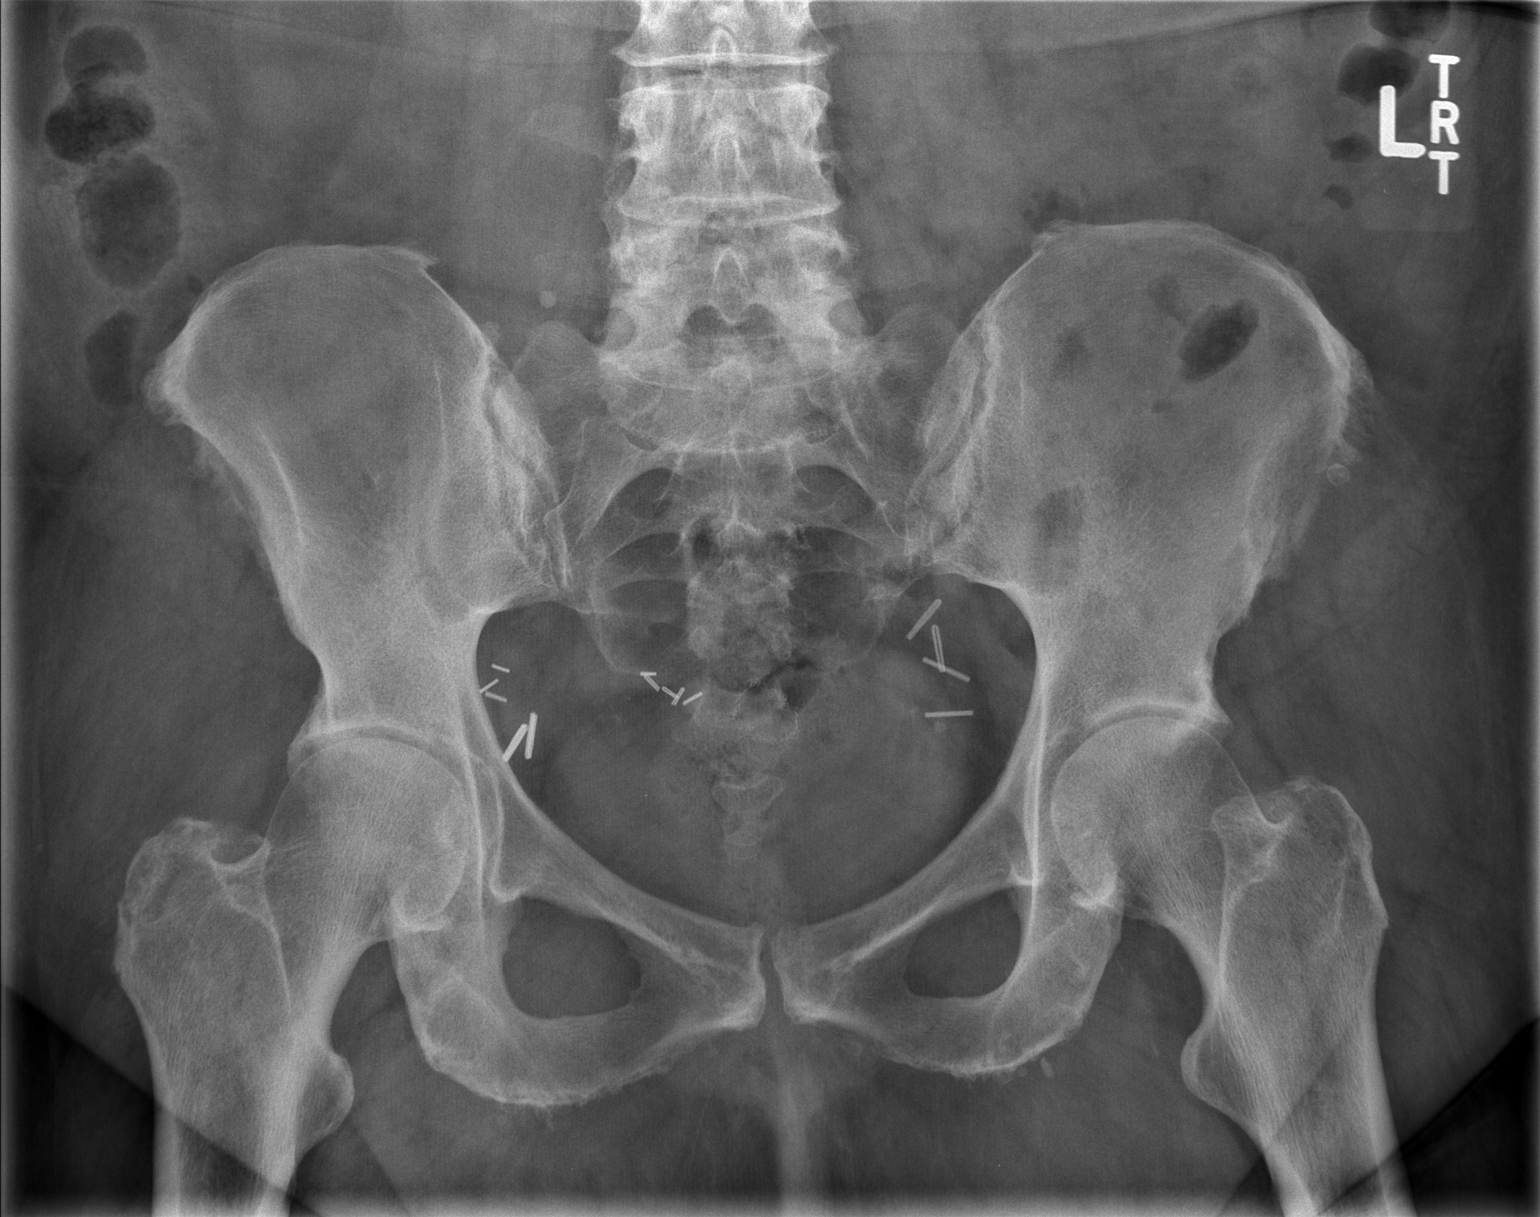

[t hip ap right]
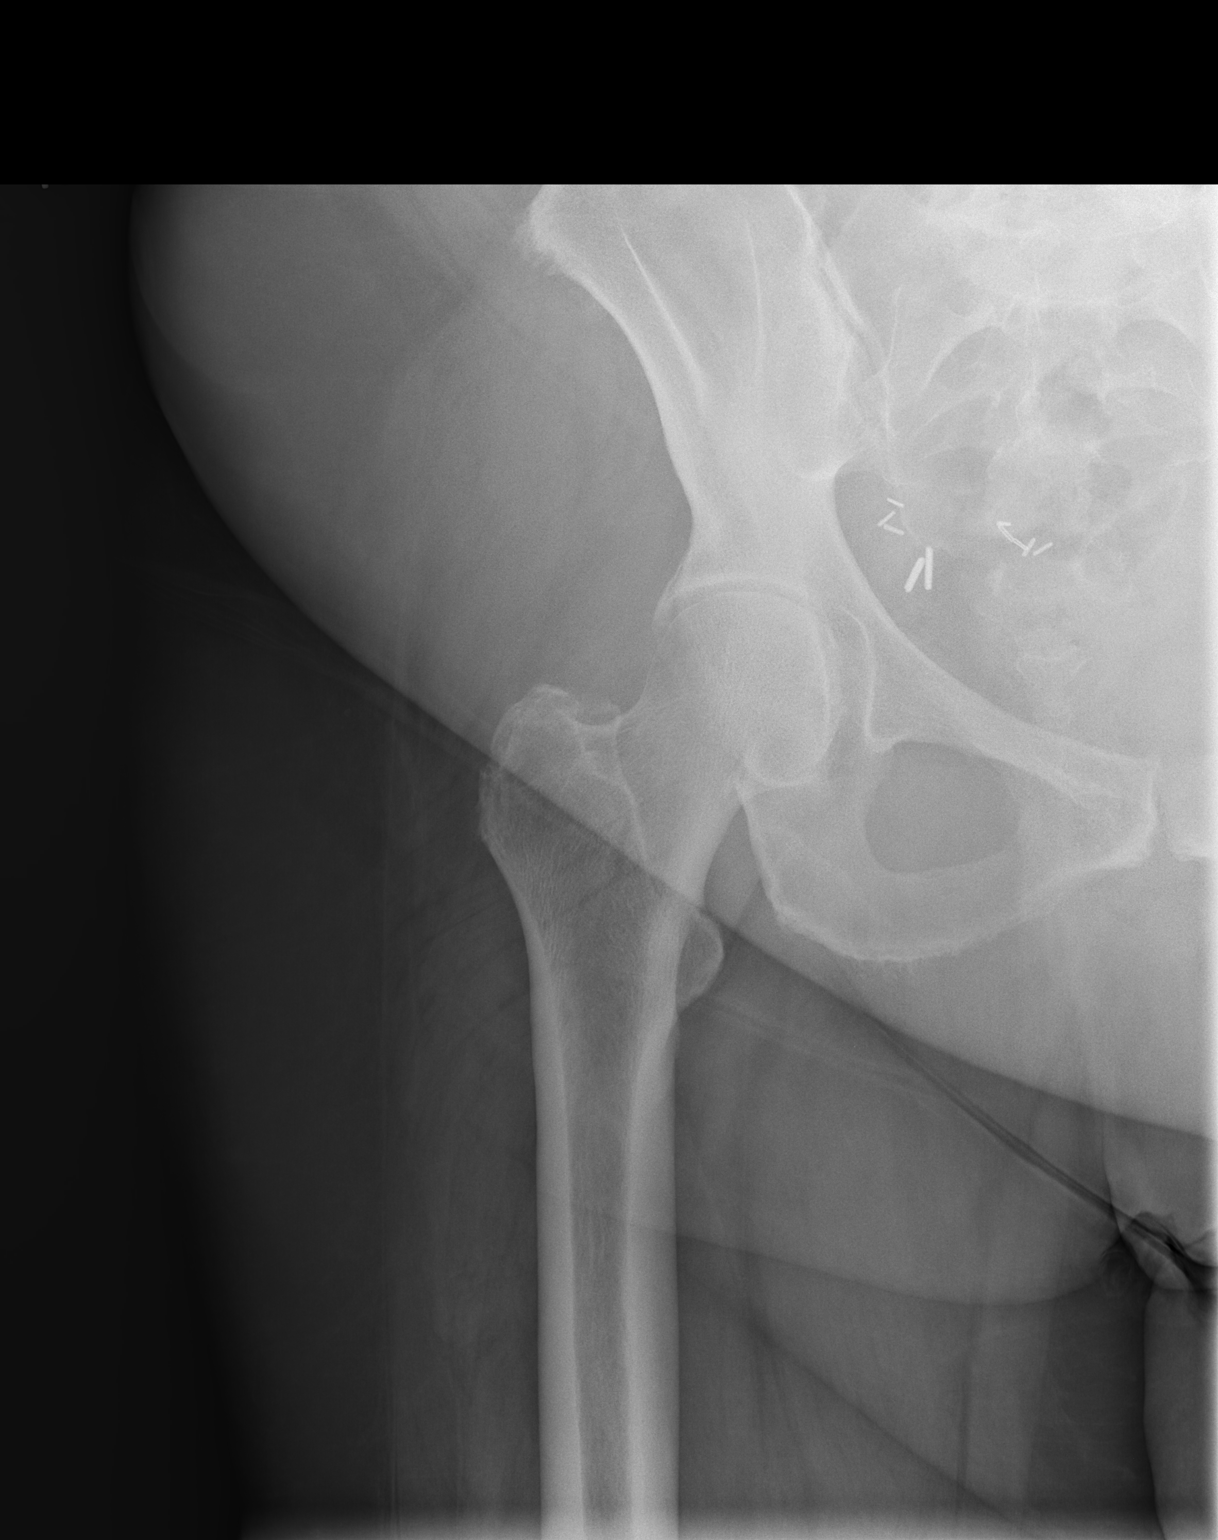

[t hip frog leg right]
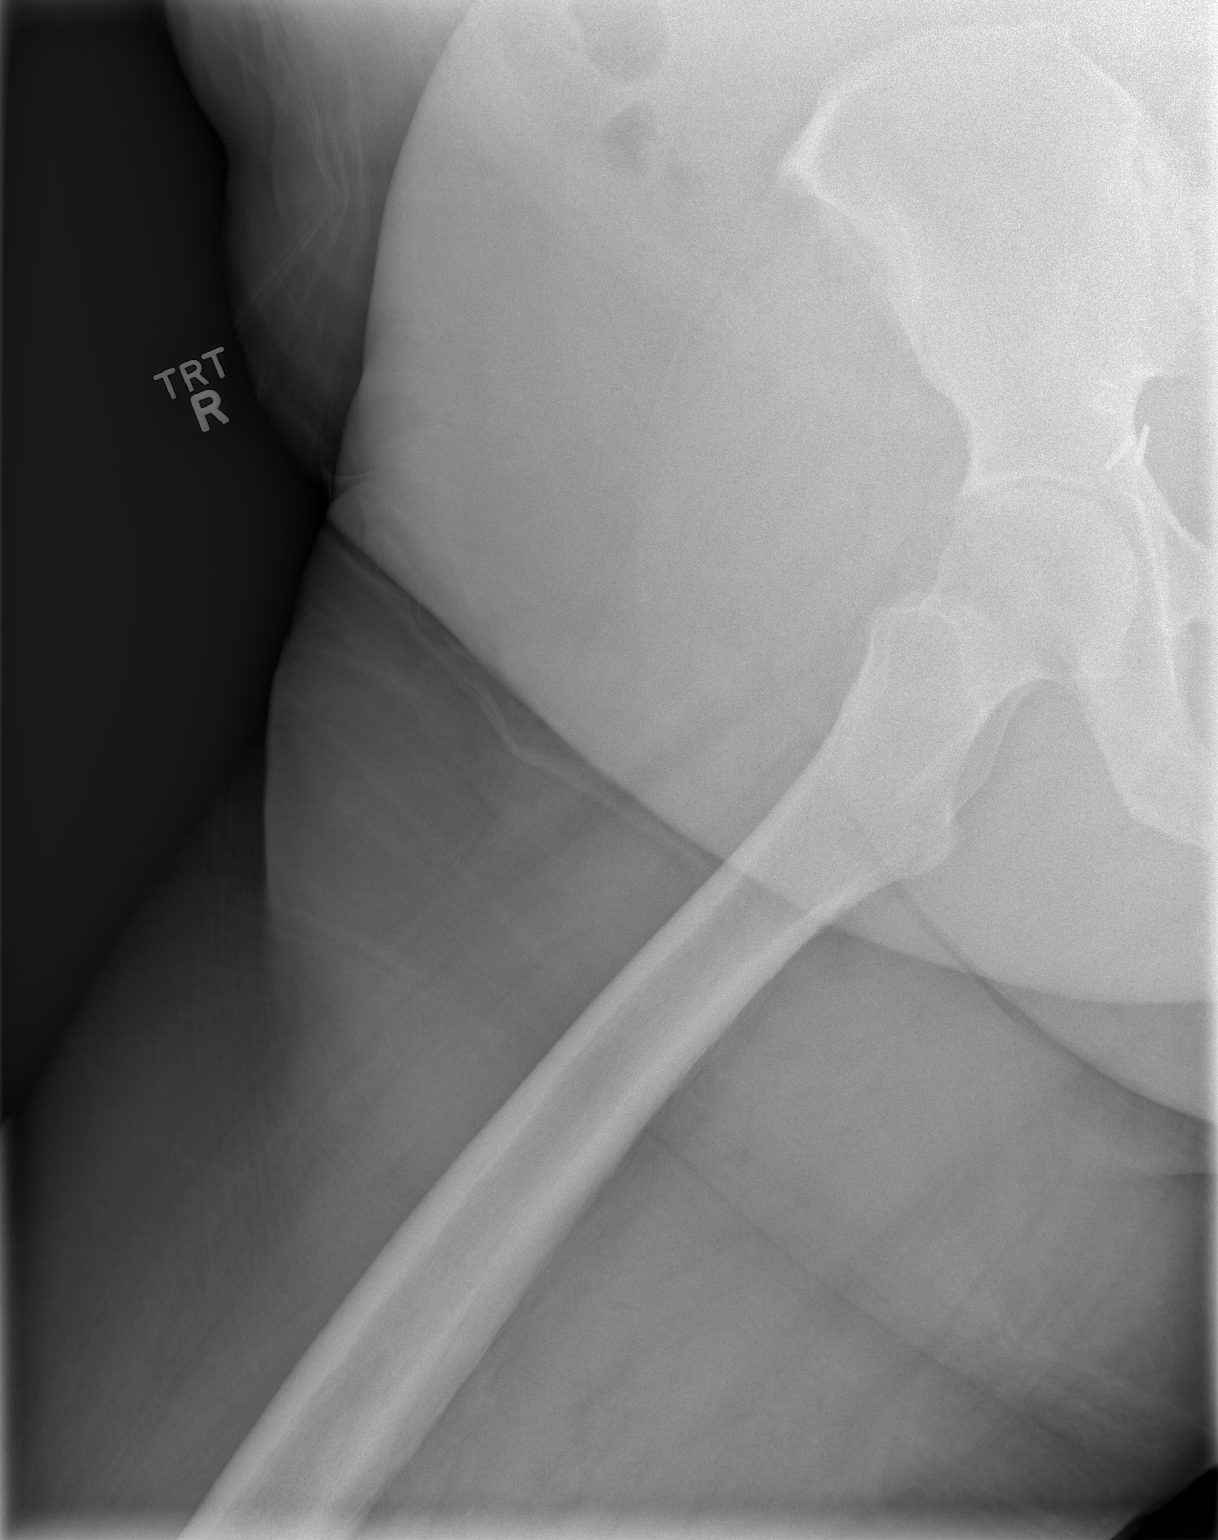

[3 of 3 positions shown; findings below may reference images not displayed]

FINDINGS: Four views of the right hip submitted.  No acute fracture
or subluxation.  Mild generative changes are noted bilateral hip
joints with narrowing of superior joint space.  Surgical clips are
noted within pelvis.
IMPRESSION: No acute fracture or subluxation.  Mild degenerative changes
bilateral hip joints.

## 2012-05-25 IMAGING — CR DG KNEE COMPLETE 4+V*L*
1 series · 1 of 1 positions shown · non-contrast
Comparison: None

CLINICAL DATA: Fell.  Left knee pain.

LEFT KNEE - COMPLETE 4+ VIEW

[t knee obl left]
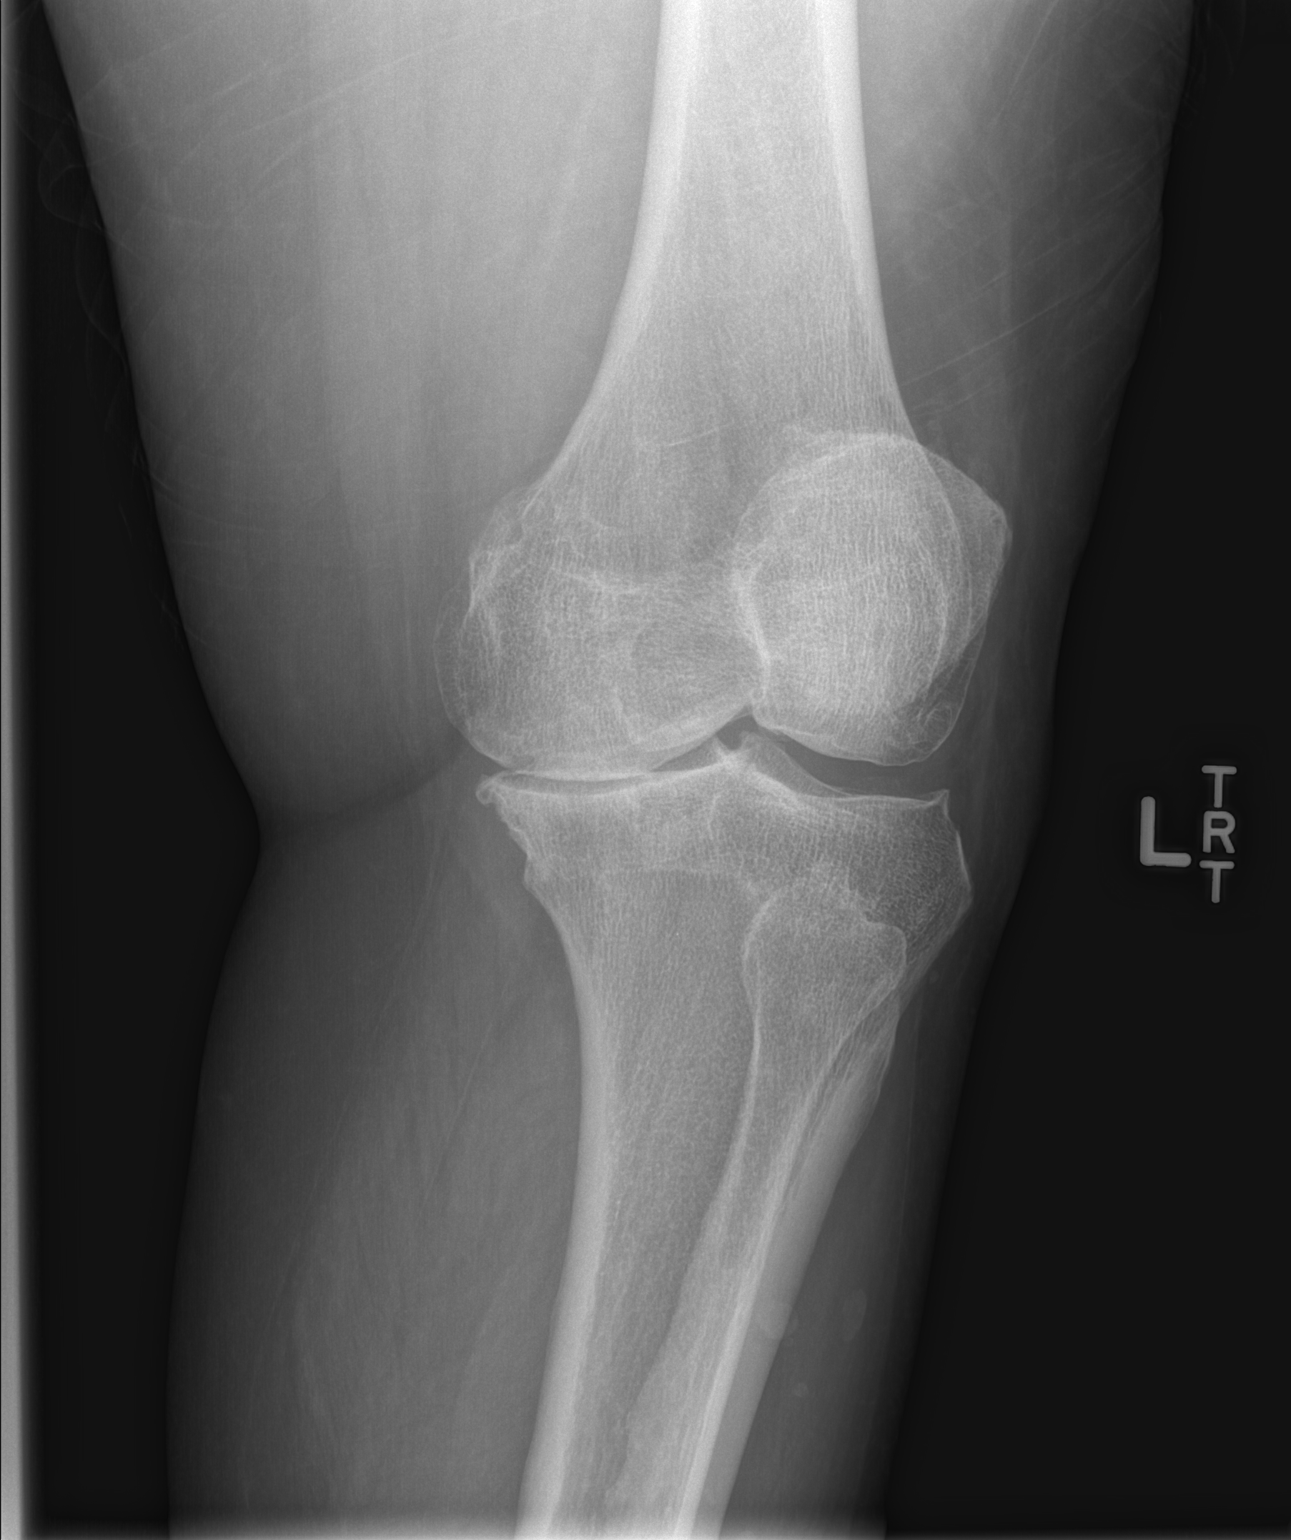

[1 of 1 positions shown; findings below may reference images not displayed]

FINDINGS: Advanced tricompartmental degenerative changes with joint
space narrowing and osteophytic spurring.  No acute fracture or
osteochondral lesion.  There is a small joint effusion.
IMPRESSION: 1.  Tricompartmental degenerative changes.
2.  No acute fracture.
3.  Small joint effusion.

## 2012-05-25 IMAGING — CR DG KNEE COMPLETE 4+V*R*
1 series · 1 of 1 positions shown · non-contrast
Comparison: [DATE].

CLINICAL DATA: Right knee pain.  Fell.

RIGHT KNEE - COMPLETE 4+ VIEW

[t knee lat left]
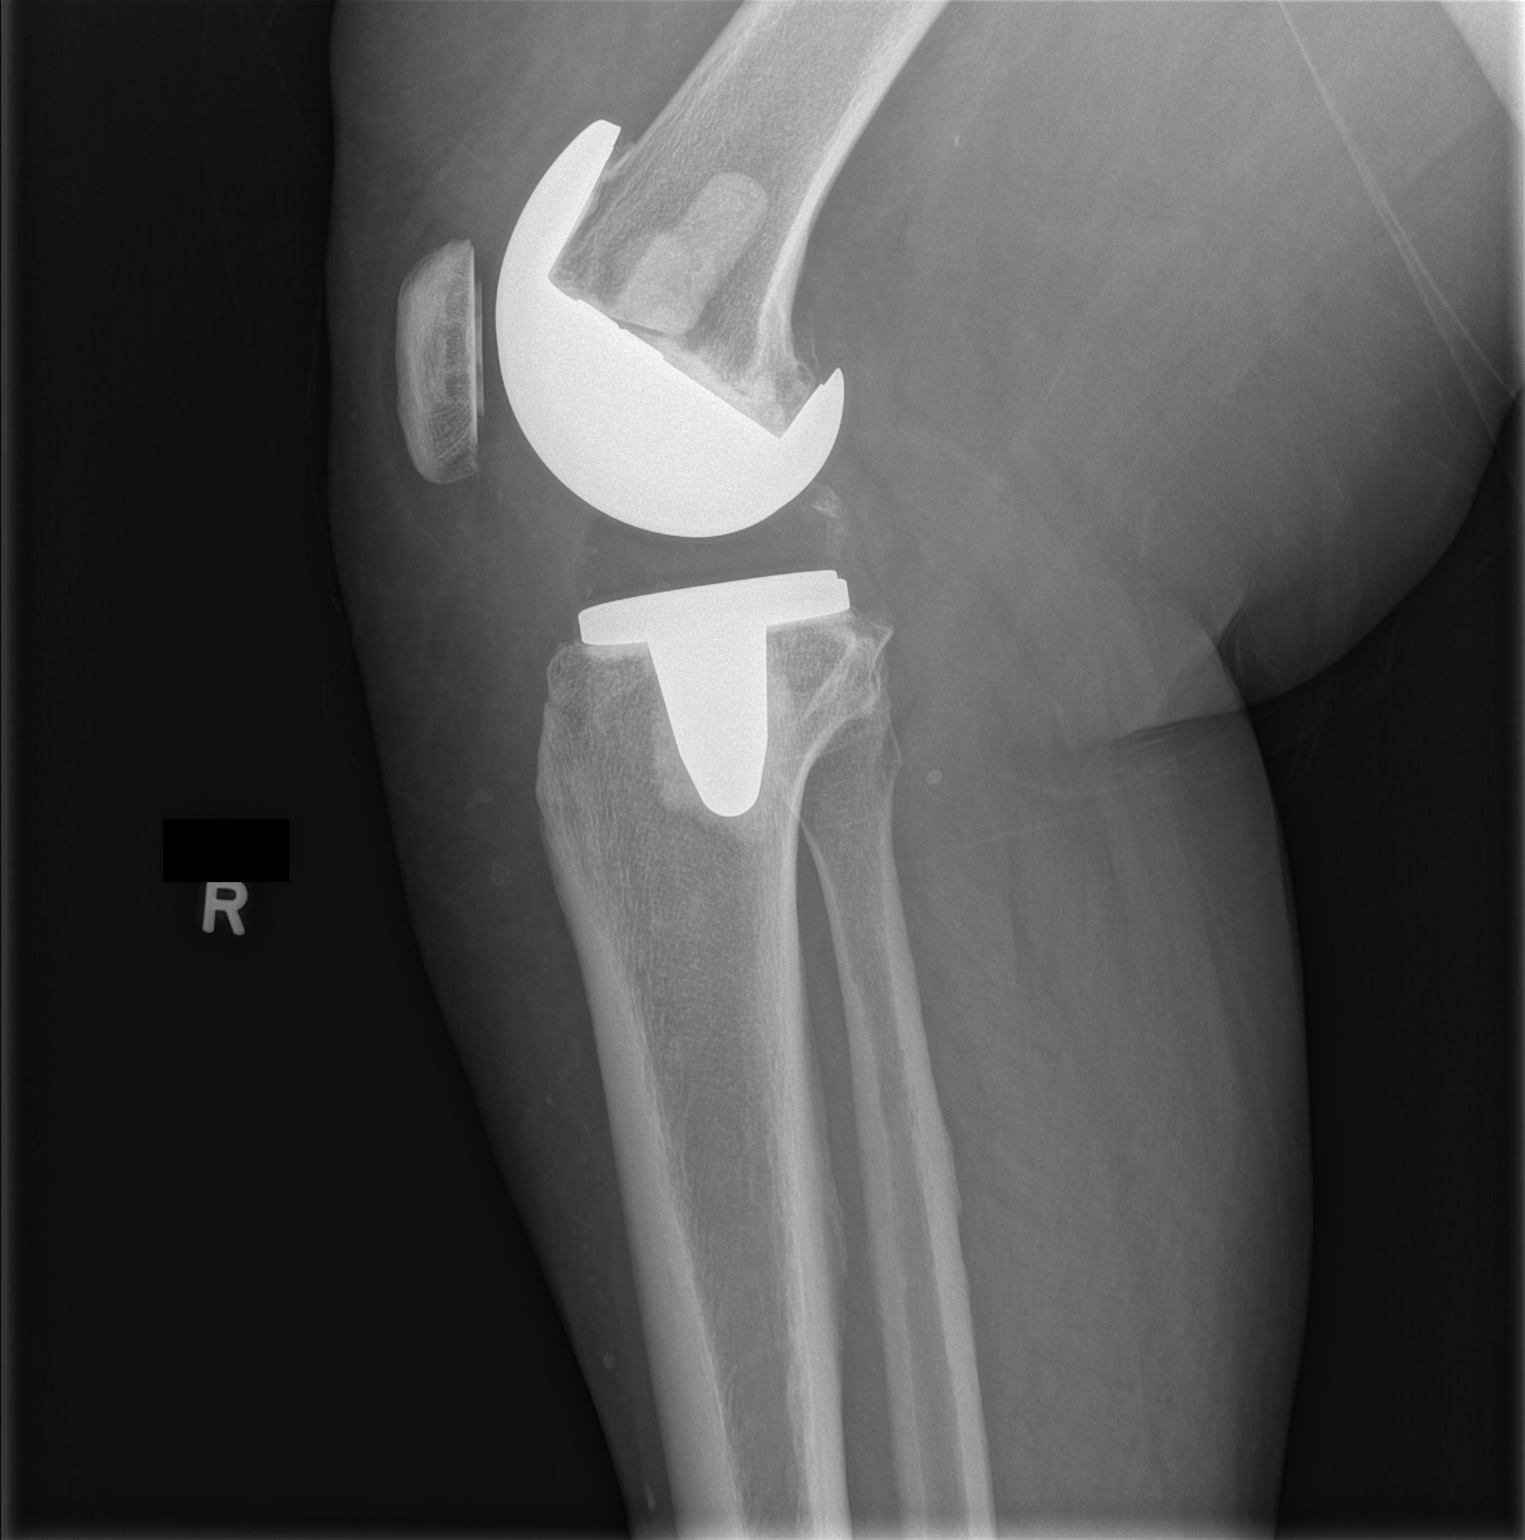

[1 of 1 positions shown; findings below may reference images not displayed]

FINDINGS: The tibial and femoral components appear well seated.  No
complicating features.  No acute fracture.  No joint effusion.
IMPRESSION: No acute bony findings.

## 2012-05-25 MED ORDER — ACETAMINOPHEN 325 MG PO TABS
650.0000 mg | ORAL_TABLET | Freq: Once | ORAL | Status: AC
  Administered 2012-05-25: 650 mg via ORAL
  Filled 2012-05-25: qty 2

## 2012-05-25 NOTE — ED Provider Notes (Signed)
History     CSN: [id number]  Arrival date & time 05/25/12  1234   First MD Initiated Contact with Patient 05/25/12 1315      Chief Complaint  Patient presents with  . Fall    down 4 steps landing on concrete, no LOC    (Consider location/radiation/quality/duration/timing/severity/associated sxs/prior treatment) HPI Hx from pt. 70yo F presents after a mechanical fall today. She is s/p TKA of R knee in 3/13 (Dr. Lequita Halt). Reports that she was going down some stairs when she lost her balance and fell onto her right side, hitting her R shoulder, hip and knee and medial L knee. Fell down about 4 stairs onto concrete. Currently c/o pain to these areas. She has been ambulatory since the fall. Denies any distal numbness or weakness. She denies hitting her head or LOC; no visual change, nausea, vomiting, dizziness. Denies neck or back pain. On 81 mg ASA but not otherwise anticoagulated.  Past Medical History  Diagnosis Date  . Hyperlipidemia   . Hypertension   . Obesity   . Diverticulitis hx of  . Diverticulosis   . Abdominal pain   . Constipation   . Colon adenoma   . Complication of anesthesia 2006-at [facility]    breathing problems-no BP med given prior  . Arthritis   . GERD (gastroesophageal reflux disease)   . Pneumonia 2005  . Numbness and tingling in right hand     pt. states has numbness of right hand very frequently-watch positioning    Past Surgical History  Procedure Date  . Skin draft for trauma   . Abdominal hysterectomy   . Bladder tack   . Colonoscopy [id number]   . Oophorectomy   . Ureter repair for tyransected left ureter   . Temporary ureter stent   . Total knee arthroplasty 01/05/2012    Procedure: TOTAL KNEE ARTHROPLASTY;  Surgeon: Loanne Drilling, MD;  Location: [facility];  Service: Orthopedics;  Laterality: Right;  . Joint replacement     Family History  Problem Relation Age of Onset  . Breast cancer Mother   . Prostate cancer Father   . Colon  cancer Father     History  Substance Use Topics  . Smoking status: Never Smoker   . Smokeless tobacco: Not on file  . Alcohol Use: No    OB History    Grav Para Term Preterm Abortions TAB SAB Ect Mult Living                  Review of Systems  Constitutional: Negative for fever and chills.  HENT: Negative for sore throat and neck pain.   Eyes: Negative for photophobia and visual disturbance.  Respiratory: Negative for chest tightness and shortness of breath.   Gastrointestinal: Negative for nausea and vomiting.  Musculoskeletal: Positive for myalgias and arthralgias. Negative for back pain.  Skin: Negative for color change and rash.  Neurological: Negative for dizziness and headaches.    Allergies  Lipitor  Home Medications   Current Outpatient Rx  Name Route Sig Dispense Refill  . AMLODIPINE BESY-BENAZEPRIL HCL 5-20 MG PO CAPS Oral Take 1 capsule by mouth daily after breakfast.    . ASPIRIN EC 81 MG PO TBEC Oral Take 81 mg by mouth daily.    . ATENOLOL-CHLORTHALIDONE 50-25 MG PO TABS Oral Take 1 tablet by mouth daily before lunch.     . FAMOTIDINE 20 MG PO TABS Oral Take 20 mg by mouth daily.    Marland Kitchen NAPROXEN  SODIUM 220 MG PO TABS Oral Take 220 mg by mouth daily.    Marland Kitchen ROSUVASTATIN CALCIUM 20 MG PO TABS Oral Take 20 mg by mouth at bedtime.    Marland Kitchen TRIMETHOPRIM 100 MG PO TABS Oral Take 100 mg by mouth daily.      BP 142/48  Pulse 64  Temp 97.7 F (36.5 C) (Oral)  Resp 18  SpO2 98%  Physical Exam  Nursing note and vitals reviewed. Constitutional: She is oriented to person, place, and time. She appears well-developed and well-nourished. No distress.  HENT:  Head: Normocephalic and atraumatic.  Mouth/Throat: Oropharynx is clear and moist. No oropharyngeal exudate.  Eyes: EOM are normal. Pupils are equal, round, and reactive to light.  Neck: Normal range of motion.  Cardiovascular: Normal rate, regular rhythm and normal heart sounds.   Pulmonary/Chest: Effort normal  and breath sounds normal. She exhibits no tenderness.  Abdominal: Soft. Bowel sounds are normal. There is no tenderness. There is no rebound and no guarding.  Musculoskeletal: Normal range of motion.       R arm: mildly ttp over prox humerus, no crepitus or swelling, pt able to ROM arm although painful to externally rotate and abduct  R hip: ttp to greater trochanter, no crepitus, no limitation of ROM, pt ambulatory to room  R knee: surgical scar c/w TKA well healed, full ROM without crepitus, mild generalized edema without effusion seen   L knee: mildly ttp along medial jt line, ROM full although crepitus noted, no edema noted  NVI distally with sensory intact to lt touch to all ext  Neurological: She is alert and oriented to person, place, and time. No cranial nerve deficit.  Skin: Skin is warm and dry. She is not diaphoretic.  Psychiatric: She has a normal mood and affect.    ED Course  Procedures (including critical care time)  Labs Reviewed  URINALYSIS, ROUTINE W REFLEX MICROSCOPIC - Abnormal; Notable for the following:    Leukocytes, UA MODERATE (*)     All other components within normal limits  URINE MICROSCOPIC-ADD ON   Dg Shoulder Right  05/25/2012  *RADIOLOGY REPORT*  Clinical Data: Larey Seat.  Right shoulder pain.  RIGHT SHOULDER - 2+ VIEW  Comparison: 08/03/2007.  Findings: Stable mild AC joint generative changes.  Stable changes of calcific tendonitis.  No acute fracture or dislocation.  The right lung apex is clear.  IMPRESSION: No fracture or dislocation.  Original Report Authenticated By: P. Loralie Champagne, M.D.   Dg Hip Complete Right  05/25/2012  *RADIOLOGY REPORT*  Clinical Data: Fall, right lateral pain  RIGHT HIP - COMPLETE 2+ VIEW  Comparison: None.  Findings: Four views of the right hip submitted.  No acute fracture or subluxation.  Mild generative changes are noted bilateral hip joints with narrowing of superior joint space.  Surgical clips are noted within pelvis.   IMPRESSION: No acute fracture or subluxation.  Mild degenerative changes bilateral hip joints.  Original Report Authenticated By: Natasha Mead, M.D.   Dg Knee Complete 4 Views Left  05/25/2012  *RADIOLOGY REPORT*  Clinical Data: Larey Seat.  Left knee pain.  LEFT KNEE - COMPLETE 4+ VIEW  Comparison: None  Findings: Advanced tricompartmental degenerative changes with joint space narrowing and osteophytic spurring.  No acute fracture or osteochondral lesion.  There is a small joint effusion.  IMPRESSION:  1.  Tricompartmental degenerative changes. 2.  No acute fracture. 3.  Small joint effusion.  Original Report Authenticated By: P. Loralie Champagne, M.D.  Dg Knee Complete 4 Views Right  05/25/2012  *RADIOLOGY REPORT*  Clinical Data: Right knee pain.  Fell.  RIGHT KNEE - COMPLETE 4+ VIEW  Comparison: 07/18/2010.  Findings: The tibial and femoral components appear well seated.  No complicating features.  No acute fracture.  No joint effusion.  IMPRESSION: No acute bony findings.  Original Report Authenticated By: P. Loralie Champagne, M.D.     1. Fall   2. Bilateral knee pain   3. Arm pain, right       MDM  Pt presents s/p mechanical fall. Ambulatory to room. She denies hitting her head, not anticoagulated. Imaging studies negative; hardware from TKA intact. Pt instructed to f/u with ortho if she has any worsening or changing pain. Reasons to return to ED discussed.  Case d/w Dr. Juleen China who saw pt with me.  Grant Fontana, PA-C 05/25/12 1736

## 2012-05-25 NOTE — ED Notes (Signed)
Larey Seat down some steps immediately prior to arrival at ED. Painful right shoulder, hip, and knee, Recently had right TKR. Also some discomfort in left knee.

## 2012-05-28 NOTE — ED Provider Notes (Signed)
Medical screening examination/treatment/procedure(s) were conducted as a shared visit with non-physician practitioner(s) and myself.  I personally evaluated the patient during the encounter.  71 year old female status post mechanical fall. Right knee and right shoulder pain. Only mild tenderness on exam. No evidence of neuro or vascular injury. Imaging negative for osseous abnormality. NP medication as needed. Return precautions discussed.  Raeford Razor, MD 05/28/12 1032

## 2012-07-10 ENCOUNTER — Other Ambulatory Visit: Payer: Self-pay | Admitting: Internal Medicine

## 2012-07-25 ENCOUNTER — Ambulatory Visit (INDEPENDENT_AMBULATORY_CARE_PROVIDER_SITE_OTHER): Payer: Medicare Other | Admitting: Internal Medicine

## 2012-07-25 ENCOUNTER — Encounter: Payer: Self-pay | Admitting: Internal Medicine

## 2012-07-25 VITALS — BP 134/80 | HR 76 | Temp 98.6°F | Resp 18 | Ht 68.5 in | Wt 280.0 lb

## 2012-07-25 DIAGNOSIS — I10 Essential (primary) hypertension: Secondary | ICD-10-CM

## 2012-07-25 DIAGNOSIS — M542 Cervicalgia: Secondary | ICD-10-CM

## 2012-07-25 DIAGNOSIS — E785 Hyperlipidemia, unspecified: Secondary | ICD-10-CM

## 2012-07-25 DIAGNOSIS — Z23 Encounter for immunization: Secondary | ICD-10-CM

## 2012-07-25 DIAGNOSIS — M25569 Pain in unspecified knee: Secondary | ICD-10-CM

## 2012-07-25 MED ORDER — METHYLPREDNISOLONE ACETATE 40 MG/ML IJ SUSP: 40.0000 mg | Freq: Once | INTRAMUSCULAR | Status: DC

## 2012-07-25 MED ORDER — TRAMADOL HCL 50 MG PO TABS: 50.0000 mg | ORAL_TABLET | Freq: Four times a day (QID) | ORAL | Status: DC | PRN

## 2012-07-25 NOTE — Patient Instructions (Addendum)
Stop the Aleve or Naprosyn because it is the most likely cause severe itching.  Use Tylenol and tramadol only

## 2012-07-25 NOTE — Progress Notes (Signed)
Subjective:    Patient ID: [name], female    DOB: 02-05-1941, 71 y.o.   MRN: [id number]  HPI Patient is a 71 year old female who is followed for hypertension hyperlipidemia and persistent numbness in her right upper extremity.  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  She has persistent osteoarthritis chronic knee pain is at increased risk for fall due to his the inability to take a. Without losing balance and requires continual physical therapy Blood pressure is very stable   Review of Systems  Constitutional: Negative for activity change, appetite change and fatigue.  HENT: Positive for neck stiffness. Negative for ear pain, congestion, neck pain, postnasal drip and sinus pressure.   Eyes: Negative for redness and visual disturbance.  Respiratory: Negative for cough, shortness of breath and wheezing.   Gastrointestinal: Negative for abdominal pain and abdominal distention.  Genitourinary: Negative for dysuria, frequency and menstrual problem.  Musculoskeletal: Positive for myalgias, back pain, joint swelling and gait problem. Negative for arthralgias.  Skin: Negative for rash and wound.  Neurological: Positive for weakness, numbness and headaches. Negative for dizziness.  Hematological: Negative for adenopathy. Does not bruise/bleed easily.  Psychiatric/Behavioral: Negative for disturbed wake/sleep cycle and decreased concentration.   Past Medical History  Diagnosis Date  . Hyperlipidemia   . Hypertension   . Obesity   . Diverticulitis hx of  . Diverticulosis   . Abdominal pain   . Constipation   . Colon adenoma   . Complication of anesthesia 2006-at [facility]    breathing problems-no BP med given prior  . Arthritis   . GERD  (gastroesophageal reflux disease)   . Pneumonia 2005  . Numbness and tingling in right hand     pt. states has numbness of right hand very frequently-watch positioning    History   Social History  . Marital Status: Married    Spouse Name: N/A    Number of Children: N/A  . Years of Education: N/A   Occupational History  . Not on file.   Social History Main Topics  . Smoking status: Never Smoker   . Smokeless tobacco: Not on file  . Alcohol Use: No  . Drug Use: No  . Sexually Active: Yes   Other Topics Concern  . Not on file   Social History Narrative  . No narrative on file    Past Surgical History  Procedure Date  . Skin draft for trauma   . Abdominal hysterectomy   . Bladder tack   . Colonoscopy [id number]   . Oophorectomy   . Ureter repair for tyransected left ureter   . Temporary ureter stent   . Total knee arthroplasty 01/05/2012    Procedure: TOTAL KNEE ARTHROPLASTY;  Surgeon: Loanne Drilling, MD;  Location: [facility];  Service: Orthopedics;  Laterality: Right;  . Joint replacement     Family History  Problem Relation Age of Onset  . Breast cancer Mother   . Prostate cancer Father   . Colon cancer Father     Allergies  Allergen Reactions  . Lipitor (Atorvastatin Calcium) Itching    Current Outpatient Prescriptions on File Prior to Visit  Medication Sig Dispense Refill  . amLODipine-benazepril (LOTREL) 5-20 MG per capsule Take 1 capsule by mouth  daily after breakfast.      . aspirin EC 81 MG tablet Take 81 mg by mouth daily.      Marland Kitchen atenolol-chlorthalidone (TENORETIC) 50-25 MG per tablet Take 1 tablet by mouth daily.       . famotidine (PEPCID) 20 MG tablet Take 20 mg by mouth daily.      . naproxen sodium (ANAPROX) 220 MG tablet Take 220 mg by mouth daily.      . rosuvastatin (CRESTOR) 20 MG tablet Take 20 mg by mouth at bedtime. 3 times a week      . DISCONTD: atenolol-chlorthalidone (TENORETIC) 50-25 MG per tablet TAKE ONE-HALF TABLET BY MOUTH  EVERY DAY IN THE MORNING  30 tablet  6    BP 134/80  Pulse 76  Temp 98.6 F (37 C)  Resp 18  Ht 5' 8.5" (1.74 m)  Wt 280 lb (127.007 kg)  BMI 41.95 kg/m2       Objective:   Physical Exam  Nursing note and vitals reviewed. Constitutional: She is oriented to person, place, and time. She appears well-developed and well-nourished.  HENT:  Head: Normocephalic and atraumatic.  Eyes: Conjunctivae normal are normal. Pupils are equal, round, and reactive to light.  Cardiovascular: Intact distal pulses.   Murmur heard. Abdominal: She exhibits distension.  Musculoskeletal: She exhibits edema and tenderness.  Neurological: She is alert and oriented to person, place, and time.  Skin: Skin is warm and dry.          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  Point injection today in the neck as a therapeutic intervention as well as a diagnostic intervention.  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  Multifactorial neuropathy with some carpal tunnel.  Stable blood pressure  Informed consent obtained and a point injection of 40 mg of Depo-Medrol and half cc of 2% lidocaine was injected into the right shoulder/trapezius muscle  Flu shot

## 2012-07-25 NOTE — Addendum Note (Signed)
Addended by: [name] on: 07/25/2012 11:10 AM   Modules accepted: Orders

## 2012-10-09 ENCOUNTER — Other Ambulatory Visit: Payer: Self-pay | Admitting: Internal Medicine

## 2012-11-04 ENCOUNTER — Other Ambulatory Visit: Payer: Self-pay | Admitting: *Deleted

## 2012-11-04 ENCOUNTER — Ambulatory Visit (INDEPENDENT_AMBULATORY_CARE_PROVIDER_SITE_OTHER): Payer: Medicare Other | Admitting: Internal Medicine

## 2012-11-04 ENCOUNTER — Encounter: Payer: Self-pay | Admitting: Internal Medicine

## 2012-11-04 VITALS — BP 140/80 | HR 72 | Temp 98.0°F | Resp 16 | Ht 68.5 in | Wt 280.0 lb

## 2012-11-04 DIAGNOSIS — I10 Essential (primary) hypertension: Secondary | ICD-10-CM

## 2012-11-04 DIAGNOSIS — N390 Urinary tract infection, site not specified: Secondary | ICD-10-CM

## 2012-11-04 LAB — POCT URINALYSIS DIPSTICK
Bilirubin, UA: NEGATIVE
Blood, UA: NEGATIVE
Glucose, UA: NEGATIVE
Ketones, UA: NEGATIVE
Nitrite, UA: NEGATIVE
Protein, UA: NEGATIVE
Spec Grav, UA: 1.015
Urobilinogen, UA: 1
pH, UA: 6.5

## 2012-11-04 MED ORDER — ATENOLOL-CHLORTHALIDONE 50-25 MG PO TABS: 1.0000 | ORAL_TABLET | Freq: Every day | ORAL | Status: DC

## 2012-11-04 MED ORDER — SULFAMETHOXAZOLE-TRIMETHOPRIM 400-80 MG PO TABS: 1.0000 | ORAL_TABLET | Freq: Two times a day (BID) | ORAL | Status: DC

## 2012-11-04 NOTE — Progress Notes (Signed)
Subjective:    Patient ID: [name], female    DOB: 1941/06/09, 72 y.o.   MRN: [id number]  HPI  The patient has TKR on right doing well The left knee now is failing Blood pressure stable Weight stable but has not lost weight Reviewed the diet plan Has noted increased color in urine Mild burning ( dr Logan Bores gave her preventation abx but she has not been taking these as DR Logan Bores prescribed  Review of Systems  Constitutional: Negative for activity change, appetite change and fatigue.       Obese  HENT: Negative for ear pain, congestion, neck pain, postnasal drip and sinus pressure.   Eyes: Negative for redness and visual disturbance.  Respiratory: Negative for cough, shortness of breath and wheezing.   Gastrointestinal: Negative for abdominal pain and abdominal distention.  Genitourinary: Positive for dysuria, urgency and decreased urine volume. Negative for frequency and menstrual problem.  Musculoskeletal: Negative for myalgias, joint swelling and arthralgias.  Skin: Negative for rash and wound.  Neurological: Positive for weakness. Negative for dizziness and headaches.  Hematological: Negative for adenopathy. Does not bruise/bleed easily.  Psychiatric/Behavioral: Negative for sleep disturbance and decreased concentration.   Past Medical History  Diagnosis Date  . Hyperlipidemia   . Hypertension   . Obesity   . Diverticulitis hx of  . Diverticulosis   . Abdominal pain   . Constipation   . Colon adenoma   . Complication of anesthesia 2006-at [facility]    breathing problems-no BP med given prior  . Arthritis   . GERD (gastroesophageal reflux disease)   . Pneumonia 2005  . Numbness and tingling in right hand     pt. states has numbness of right hand very frequently-watch positioning    History   Social History  . Marital Status: Married    Spouse Name: N/A    Number of Children: N/A  . Years of Education: N/A   Occupational History  . Not on file.   Social  History Main Topics  . Smoking status: Never Smoker   . Smokeless tobacco: Not on file  . Alcohol Use: No  . Drug Use: No  . Sexually Active: Yes   Other Topics Concern  . Not on file   Social History Narrative  . No narrative on file    Past Surgical History  Procedure Date  . Skin draft for trauma   . Abdominal hysterectomy   . Bladder tack   . Colonoscopy [id number]   . Oophorectomy   . Ureter repair for tyransected left ureter   . Temporary ureter stent   . Total knee arthroplasty 01/05/2012    Procedure: TOTAL KNEE ARTHROPLASTY;  Surgeon: Loanne Drilling, MD;  Location: [facility];  Service: Orthopedics;  Laterality: Right;  . Joint replacement     Family History  Problem Relation Age of Onset  . Breast cancer Mother   . Prostate cancer Father   . Colon cancer Father     Allergies  Allergen Reactions  . Lipitor (Atorvastatin Calcium) Itching    Current Outpatient Prescriptions on File Prior to Visit  Medication Sig Dispense Refill  . amLODipine-benazepril (LOTREL) 5-20 MG per capsule TAKE ONE CAPSULE BY MOUTH EVERY DAY  30 capsule  10  . aspirin EC 81 MG tablet Take 81 mg by mouth daily.      Marland Kitchen atenolol-chlorthalidone (TENORETIC) 50-25 MG per tablet Take 1 tablet by mouth daily.       . famotidine (PEPCID) 20 MG tablet  Take 20 mg by mouth daily.      . rosuvastatin (CRESTOR) 20 MG tablet Take 20 mg by mouth at bedtime. 3 times a week        BP 140/80  Pulse 72  Temp 98 F (36.7 C)  Resp 16  Ht 5' 8.5" (1.74 m)  Wt 280 lb (127.007 kg)  BMI 41.95 kg/m2        Objective:   Physical Exam  Nursing note and vitals reviewed. Constitutional: She is oriented to person, place, and time. She appears well-developed and well-nourished. No distress.  HENT:  Head: Normocephalic and atraumatic.  Right Ear: External ear normal.  Left Ear: External ear normal.  Nose: Nose normal.  Mouth/Throat: Oropharynx is clear and moist.  Eyes: Conjunctivae normal and  EOM are normal. Pupils are equal, round, and reactive to light.  Neck: Normal range of motion. Neck supple. No JVD present. No tracheal deviation present. No thyromegaly present.  Cardiovascular: Normal rate, regular rhythm and intact distal pulses.   Murmur heard. Pulmonary/Chest: Effort normal and breath sounds normal. She has no wheezes. She exhibits no tenderness.  Abdominal: Soft. Bowel sounds are normal.  Musculoskeletal: Normal range of motion. She exhibits edema and tenderness.  Lymphadenopathy:    She has no cervical adenopathy.  Neurological: She is alert and oriented to person, place, and time. She has normal reflexes. No cranial nerve deficit.  Skin: Skin is warm and dry. She is not diaphoretic.  Psychiatric: She has a normal mood and affect. Her behavior is normal.          Assessment & Plan:  HTN stable progressive DJD of the knee Weight management Chronic UTI and need to take the trimethoprim as prescribed

## 2012-11-04 NOTE — Patient Instructions (Signed)
The patient is instructed to continue all medications as prescribed. Schedule followup with check out clerk upon leaving the clinic  

## 2012-11-06 LAB — URINE CULTURE
Colony Count: NO GROWTH
Organism ID, Bacteria: NO GROWTH

## 2012-11-12 ENCOUNTER — Telehealth: Payer: Self-pay | Admitting: Internal Medicine

## 2012-11-12 NOTE — Telephone Encounter (Signed)
Pt would like results of labs done 11/04/12.  Pt would like you to call her on [phone].  If no answer, pls try husband's cell: [phone]. Thank you.

## 2012-11-12 NOTE — Telephone Encounter (Signed)
Already tx for uti and culture was no growth

## 2012-12-10 ENCOUNTER — Telehealth: Payer: Self-pay | Admitting: Internal Medicine

## 2012-12-10 ENCOUNTER — Ambulatory Visit (INDEPENDENT_AMBULATORY_CARE_PROVIDER_SITE_OTHER)
Admission: RE | Admit: 2012-12-10 | Discharge: 2012-12-10 | Disposition: A | Discharge status: Home / Self Care | Payer: Medicare Other | Source: Ambulatory Visit | Attending: Internal Medicine | Admitting: Internal Medicine

## 2012-12-10 ENCOUNTER — Ambulatory Visit (INDEPENDENT_AMBULATORY_CARE_PROVIDER_SITE_OTHER): Payer: Medicare Other | Admitting: Internal Medicine

## 2012-12-10 VITALS — BP 146/74 | HR 67 | Temp 98.3°F | Wt 284.0 lb

## 2012-12-10 DIAGNOSIS — R059 Cough, unspecified: Secondary | ICD-10-CM | POA: Insufficient documentation

## 2012-12-10 DIAGNOSIS — R05 Cough: Secondary | ICD-10-CM

## 2012-12-10 IMAGING — CR DG CHEST 2V
2 series · 2 of 2 positions shown · non-contrast
Comparison: [DATE]

CLINICAL DATA: Cough

CHEST - 2 VIEW

[view not recorded (1 of 2)]
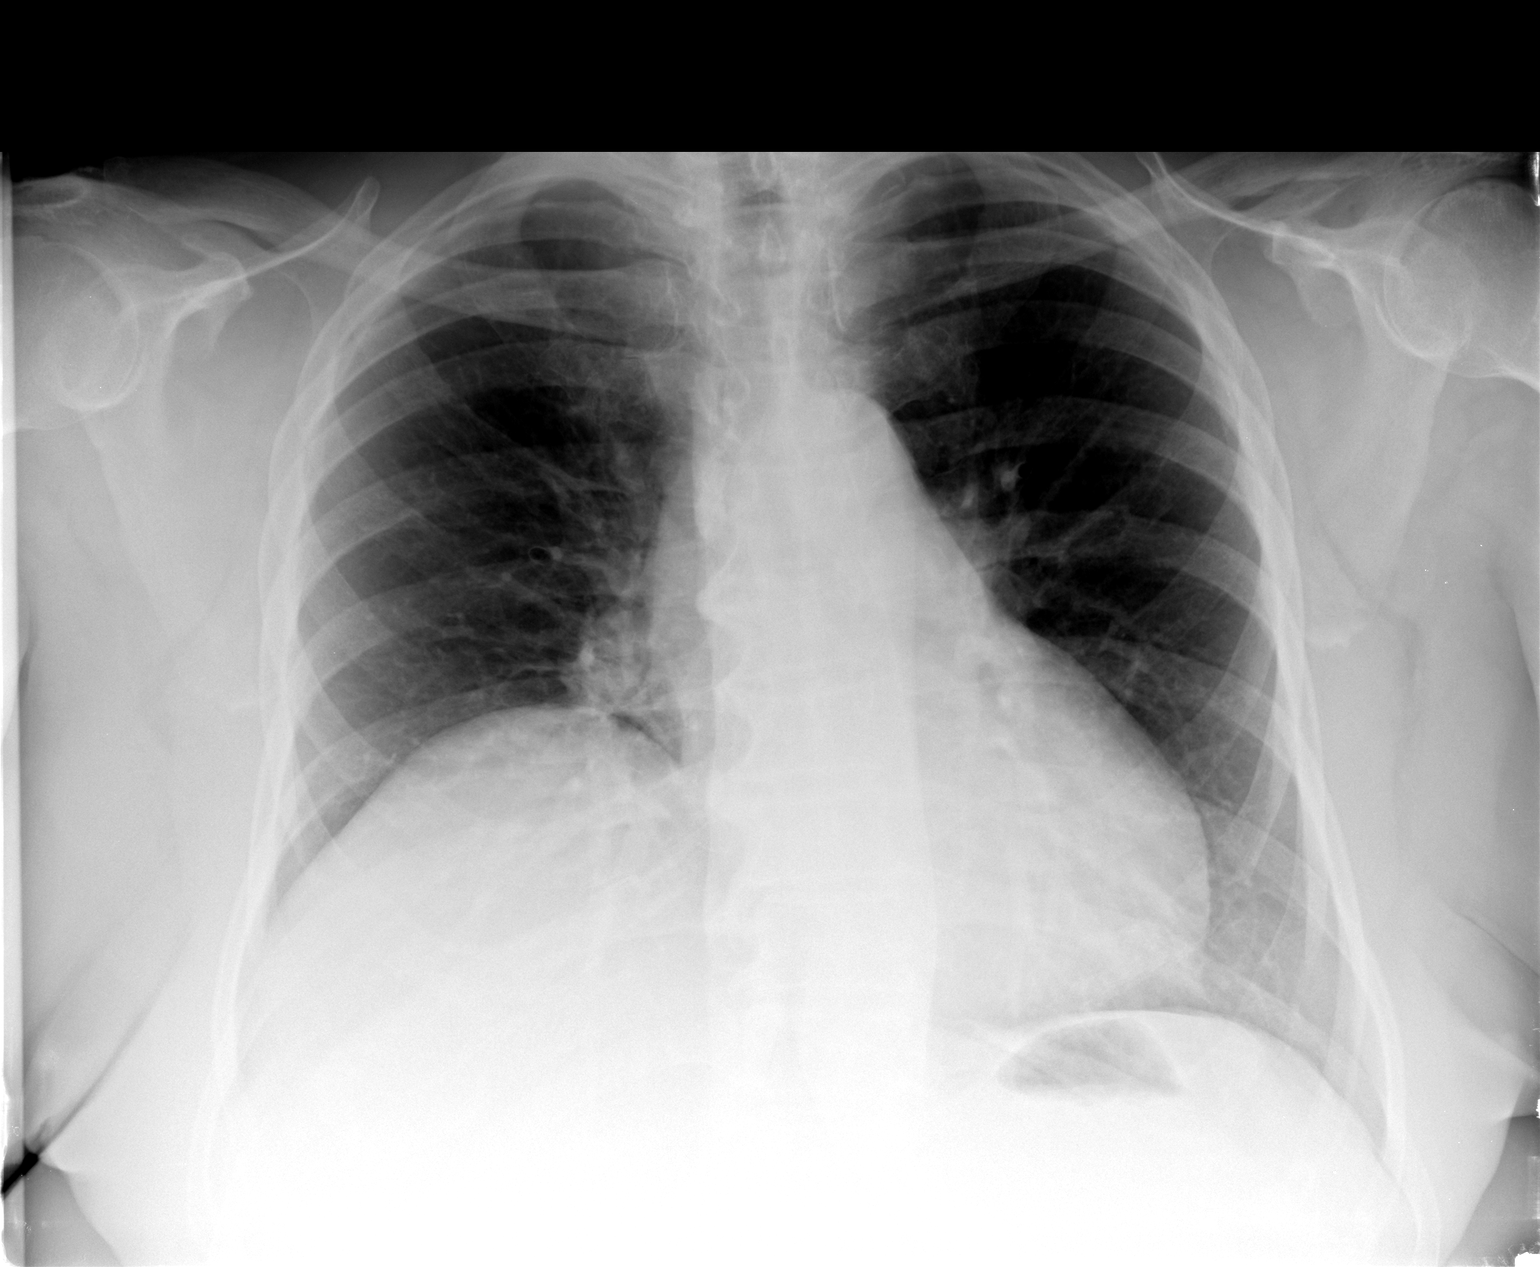

[view not recorded (2 of 2)]
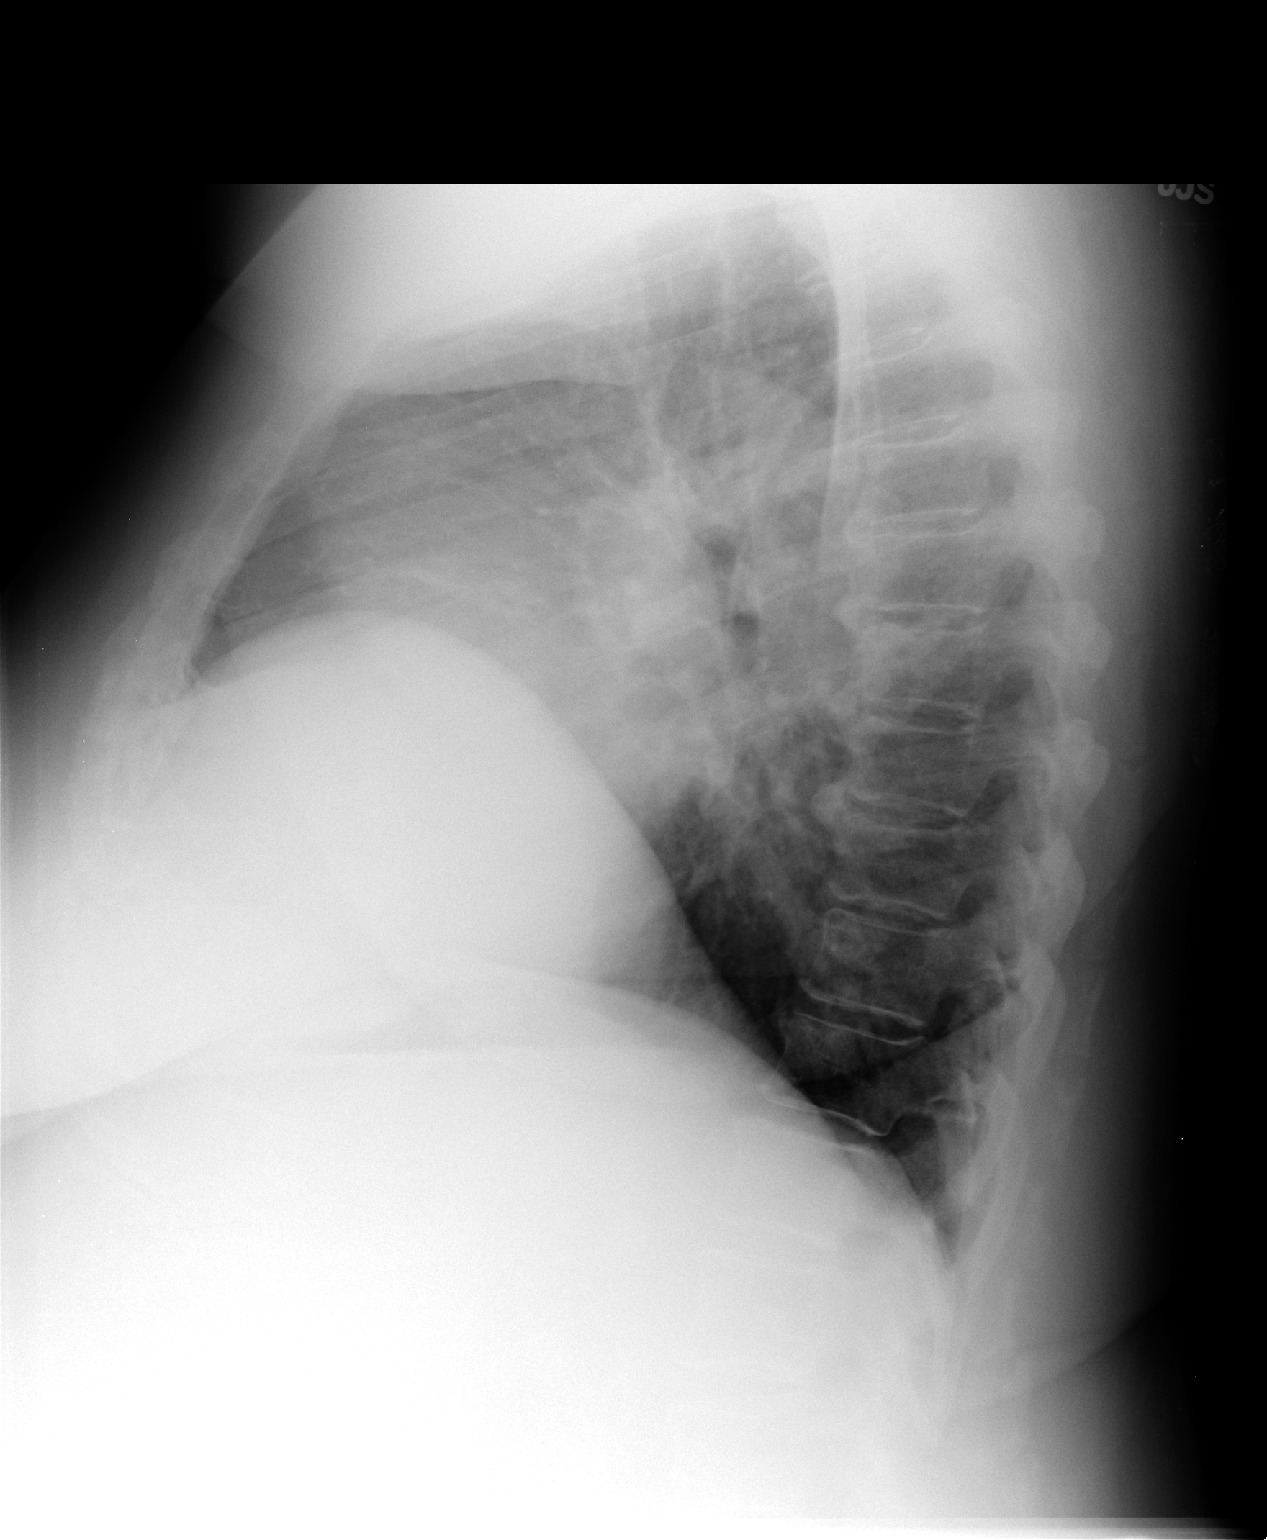

[2 of 2 positions shown; findings below may reference images not displayed]

FINDINGS: Stable borderline cardiomegaly.  Stable moderate
elevation/eventration of the right hemidiaphragm anteriorly.
Normal pulmonary vascularity.  The lungs are clear.  There is no
pleural effusion.  Typical degenerative changes of the thoracic
spine.  No acute or suspicious bony abnormality.
IMPRESSION: Stable chest radiograph.  Borderline cardiomegaly with right hemi
diaphragm elevation.  No acute cardiopulmonary disease.

## 2012-12-10 NOTE — Patient Instructions (Addendum)
Gargle with warm salt water and use nasal saline spray as directed Use mucinex DM over the counter twice daily Please contact our office if your symptoms do not improve or gets worse.

## 2012-12-10 NOTE — Progress Notes (Signed)
Subjective:    Patient ID: [name], female    DOB: May 07, 1941, 72 y.o.   MRN: [id number]  HPI  72 year old African American female with history of hypertension and hyperlipidemia complains of cough and chest congestion for 5 days. Patient has associated left lower rib pain that is worse with cough. Pain is not worse with deep inhalation. She has mild yellowish sputum with cough. She denies fever or chills. She denies shortness of breath.  She has never smoked.   Review of Systems Negative for fever chills, negative for myalgias  Past Medical History  Diagnosis Date  . Hyperlipidemia   . Hypertension   . Obesity   . Diverticulitis hx of  . Diverticulosis   . Abdominal pain   . Constipation   . Colon adenoma   . Complication of anesthesia 2006-at [facility]    breathing problems-no BP med given prior  . Arthritis   . GERD (gastroesophageal reflux disease)   . Pneumonia 2005  . Numbness and tingling in right hand     pt. states has numbness of right hand very frequently-watch positioning    History   Social History  . Marital Status: Married    Spouse Name: N/A    Number of Children: N/A  . Years of Education: N/A   Occupational History  . Not on file.   Social History Main Topics  . Smoking status: Never Smoker   . Smokeless tobacco: Not on file  . Alcohol Use: No  . Drug Use: No  . Sexually Active: Yes   Other Topics Concern  . Not on file   Social History Narrative  . No narrative on file    Past Surgical History  Procedure Laterality Date  . Skin draft for trauma    . Abdominal hysterectomy    . Bladder tack    . Colonoscopy [id number]    . Oophorectomy    . Ureter repair for tyransected left ureter    . Temporary ureter stent    . Total knee arthroplasty  01/05/2012    Procedure: TOTAL KNEE ARTHROPLASTY;  Surgeon: Loanne Drilling, MD;  Location: [facility];  Service: Orthopedics;  Laterality: Right;  . Joint replacement      Family History   Problem Relation Age of Onset  . Breast cancer Mother   . Prostate cancer Father   . Colon cancer Father     Allergies  Allergen Reactions  . Lipitor (Atorvastatin Calcium) Itching    Current Outpatient Prescriptions on File Prior to Visit  Medication Sig Dispense Refill  . amLODipine-benazepril (LOTREL) 5-20 MG per capsule TAKE ONE CAPSULE BY MOUTH EVERY DAY  30 capsule  10  . aspirin EC 81 MG tablet Take 81 mg by mouth daily.      Marland Kitchen atenolol-chlorthalidone (TENORETIC) 50-25 MG per tablet Take 1 tablet by mouth daily.  30 tablet  11  . famotidine (PEPCID) 20 MG tablet Take 20 mg by mouth daily.      . rosuvastatin (CRESTOR) 20 MG tablet Take 20 mg by mouth at bedtime. 3 times a week      . sulfamethoxazole-trimethoprim (BACTRIM) 400-80 MG per tablet Take 1 tablet by mouth 2 (two) times daily.  20 tablet  0   No current facility-administered medications on file prior to visit.    BP 146/74  Pulse 67  Temp(Src) 98.3 F (36.8 C) (Oral)  Wt 284 lb (128.822 kg)  BMI 42.55 kg/m2  SpO2 97%  Objective:   Physical Exam  Constitutional: She is oriented to person, place, and time. She appears well-developed and well-nourished.  HENT:  Head: Normocephalic and atraumatic.  Right Ear: External ear normal.  Left Ear: External ear normal.  Mouth/Throat: No oropharyngeal exudate.  Oropharyngeal erythema, oropharyngeal crowding  Neck: Neck supple.  No neck tenderness  Cardiovascular: Normal rate, regular rhythm and normal heart sounds.   No murmur heard. Pulmonary/Chest: Effort normal and breath sounds normal. She has no wheezes.  Neurological: She is alert and oriented to person, place, and time. No cranial nerve deficit.  Psychiatric: She has a normal mood and affect. Her behavior is normal.          Assessment & Plan:

## 2012-12-10 NOTE — Assessment & Plan Note (Signed)
72 year old African American female complains of cough and chest congestion for 5 days. Her lungs are clear and exam. Probable viral URI.  She has left lower rib pain. I suspect diaphragm strain. Cannot rule out atypical pneumonia - Obtain chest x-ray. Use Mucinex DM as directed.  Patient advised to call office if symptoms persist or worsen.

## 2012-12-10 NOTE — Telephone Encounter (Signed)
Patient Information:  Caller Name: Shirline  Phone: [phone]  Patient: [name], [name]  Gender: Female  DOB: 1941/08/01  Age: 72 Years  PCP: [name] (Adults only)  Office Follow Up:  Does the office need to follow up with this patient?: No  Instructions For The Office: N/A   Symptoms  Reason For Call & Symptoms: Started with cold sx onset 12/05/12 and this morning she is coughing up yellowish mucos and chest feels tight. She has sores that have scabbed below nose on R side. She is wheezing when she lays down.  Reviewed Health History In EMR: Yes  Reviewed Medications In EMR: Yes  Reviewed Allergies In EMR: Yes  Reviewed Surgeries / Procedures: Yes  Date of Onset of Symptoms: 12/05/2012  Treatments Tried: Throat Spray, Tussinex  Treatments Tried Worked: No  Guideline(s) Used:  Cough  Disposition Per Guideline:   Go to Office Now  Reason For Disposition Reached:   Wheezing is present  Advice Given:  Reassurance  Coughing is the way that our lungs remove irritants and mucus. It helps protect our lungs from getting pneumonia.  You can get a dry hacking cough after a chest cold. Sometimes this type of cough can last 1-3 weeks, and be worse at night.  You can also get a cough after being exposed to irritating substances like smoke, strong perfumes, and dust.  Here is some care advice that should help.  Cough Medicines:  OTC Cough Syrups: The most common cough suppressant in OTC cough medications is dextromethorphan. Often the letters "DM" appear in the name.  OTC Cough Drops: Cough drops can help a lot, especially for mild coughs. They reduce coughing by soothing your irritated throat and removing that tickle sensation in the back of the throat. Cough drops also have the advantage of portability - you can carry them with you.  Home Remedy - Hard Candy: Hard candy works just as well as medicine-flavored OTC cough drops. Diabetics should use sugar-free candy.  Home Remedy -  Honey: This old home remedy has been shown to help decrease coughing at night. The adult dosage is 2 teaspoons (10 ml) at bedtime. Honey should not be given to infants under one year of age.  Coughing Spasms:  Drink warm fluids. Inhale warm mist (Reason: both relax the airway and loosen up the phlegm).  Suck on cough drops or hard candy to coat the irritated throat.  Prevent Dehydration:  Drink adequate liquids.  This will help soothe an irritated or dry throat and loosen up the phlegm.  Call Back If:  Difficulty breathing  Cough lasts more than 3 weeks  Fever lasts > 3 days  You become worse.  Expected Course:   The expected course depends on what is causing the cough.  Viral bronchitis (chest cold) causes a cough that lasts 1 to 3 weeks. Sometimes you may cough up lots of phlegm (sputum, mucus). The mucus can normally be white, gray, yellow, or green.  Appointment Scheduled:  12/10/2012 14:00:00 Appointment Scheduled Provider:  Artist Pais, Doe-Hyun Molly Maduro) (Adults only)

## 2013-01-07 ENCOUNTER — Other Ambulatory Visit: Payer: Self-pay

## 2013-01-07 DIAGNOSIS — Z1231 Encounter for screening mammogram for malignant neoplasm of breast: Secondary | ICD-10-CM

## 2013-01-29 ENCOUNTER — Ambulatory Visit: Payer: Medicare Other

## 2013-02-03 ENCOUNTER — Ambulatory Visit: Payer: Medicare Other | Admitting: Internal Medicine

## 2013-02-25 ENCOUNTER — Ambulatory Visit: Payer: Medicare Other

## 2013-02-28 ENCOUNTER — Ambulatory Visit
Admission: RE | Admit: 2013-02-28 | Discharge: 2013-02-28 | Disposition: A | Discharge status: Home / Self Care | Payer: Medicare Other | Source: Ambulatory Visit

## 2013-02-28 DIAGNOSIS — Z1231 Encounter for screening mammogram for malignant neoplasm of breast: Secondary | ICD-10-CM

## 2013-02-28 IMAGING — MG MM DIGITAL SCREENING BILAT
6 of 9 series · 6 of 25 positions shown · non-contrast
Comparison: Previous exams.

CLINICAL DATA: Screening.

DIGITAL BILATERAL SCREENING MAMMOGRAM WITH CAD
DIGITAL BREAST TOMOSYNTHESIS
Digital breast tomosynthesis images are acquired in two
projections.  These images are reviewed in combination with the
digital mammogram, confirming the findings below.

[R CV]
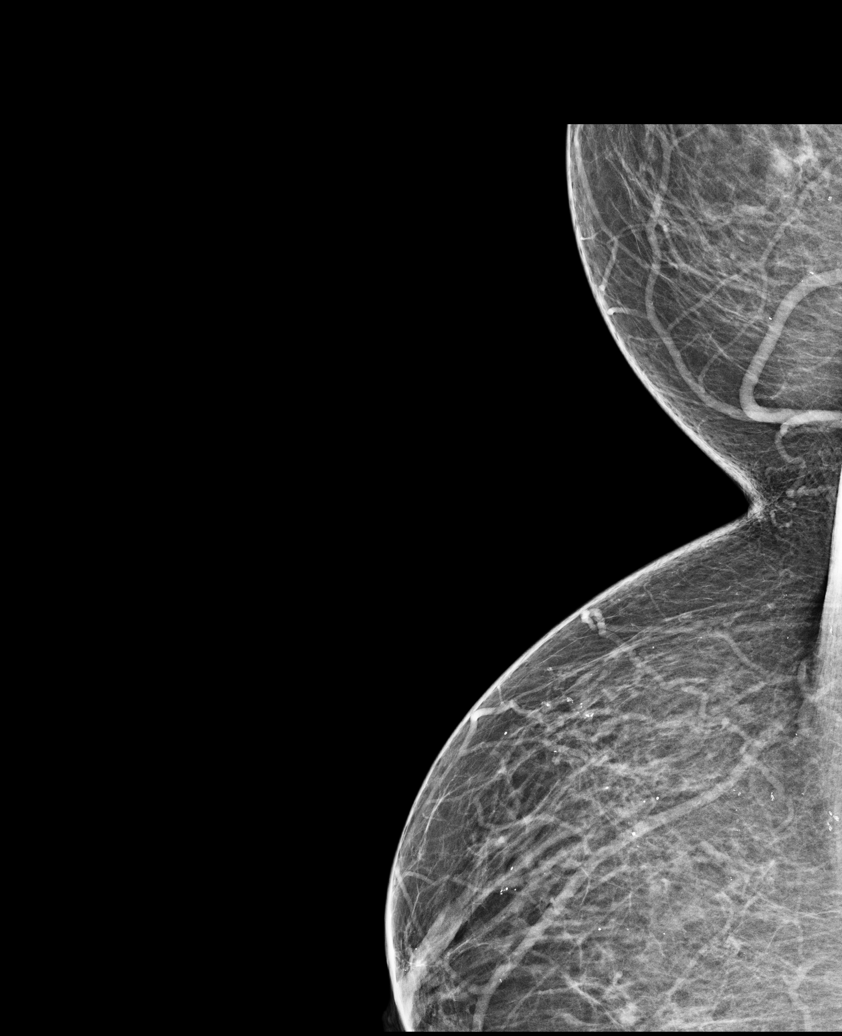

[L MLO]
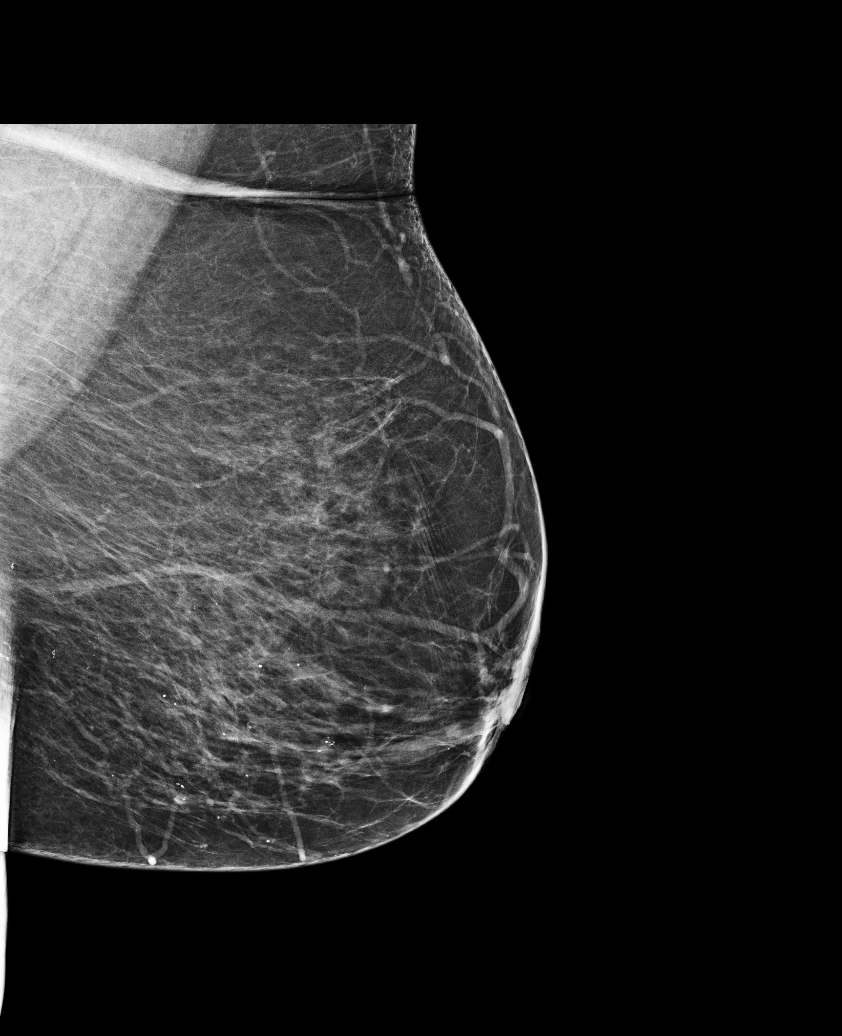

[R MLO]
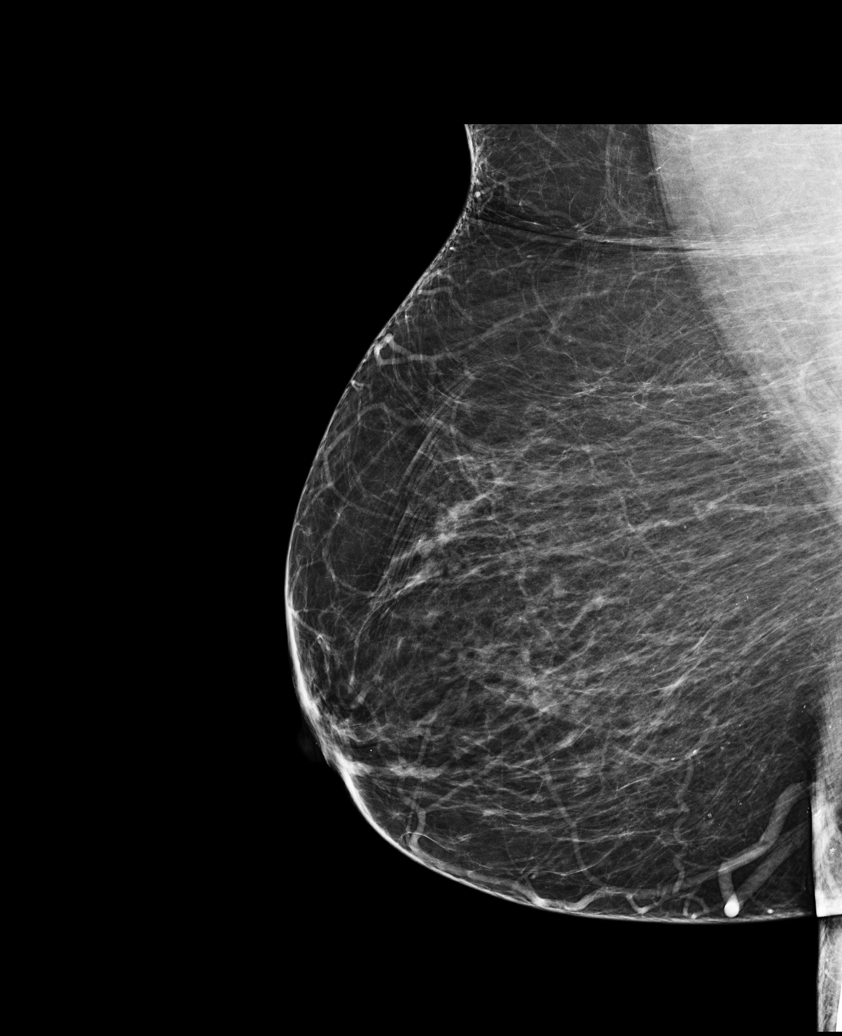

[R CC]
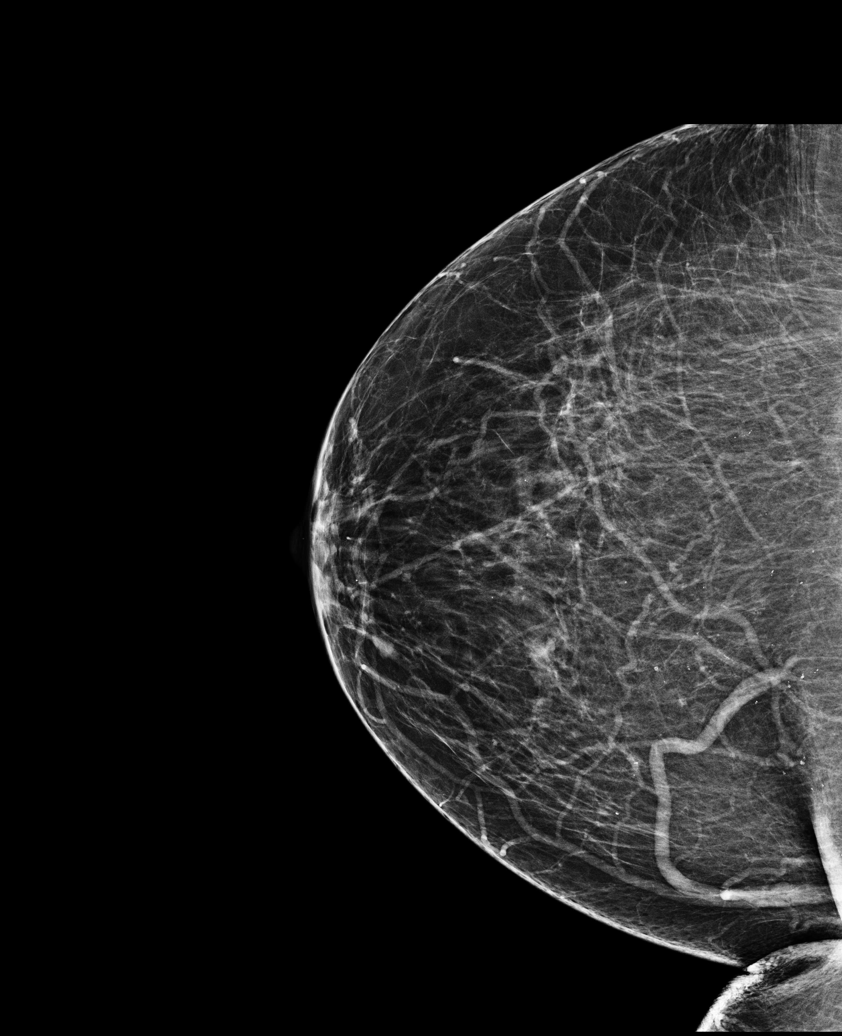

[L CC]
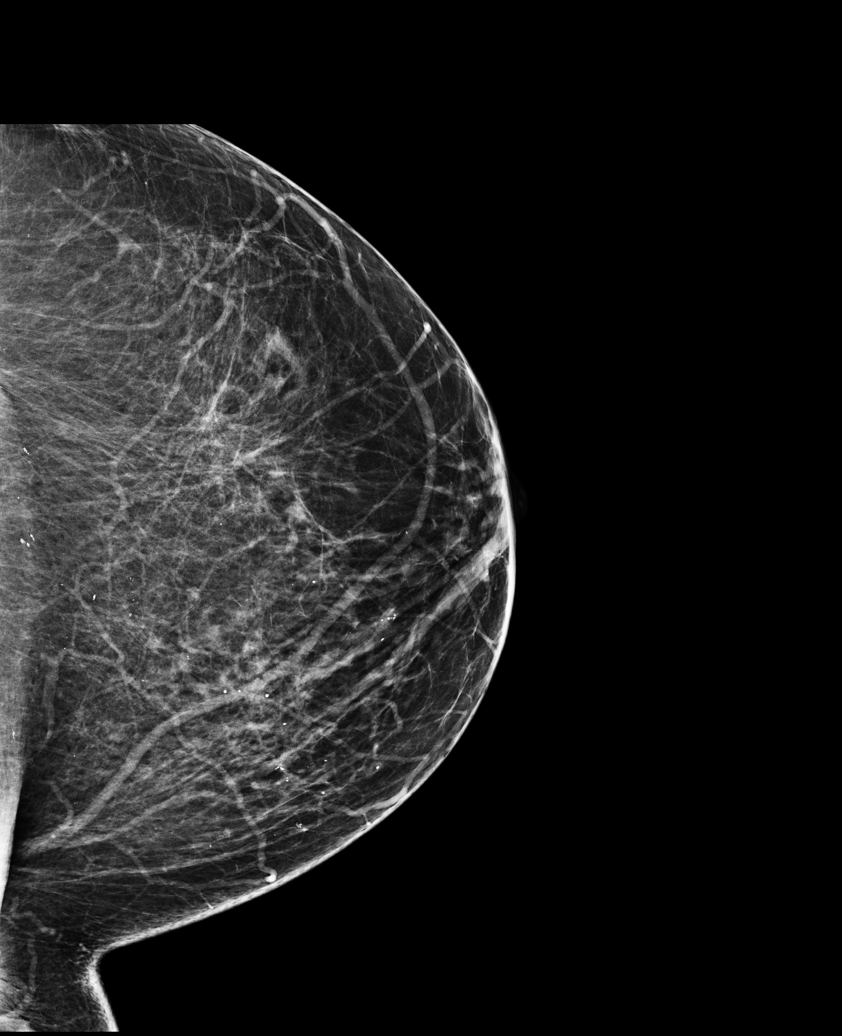

[R CC tomo · tomo slice 34/67.0]
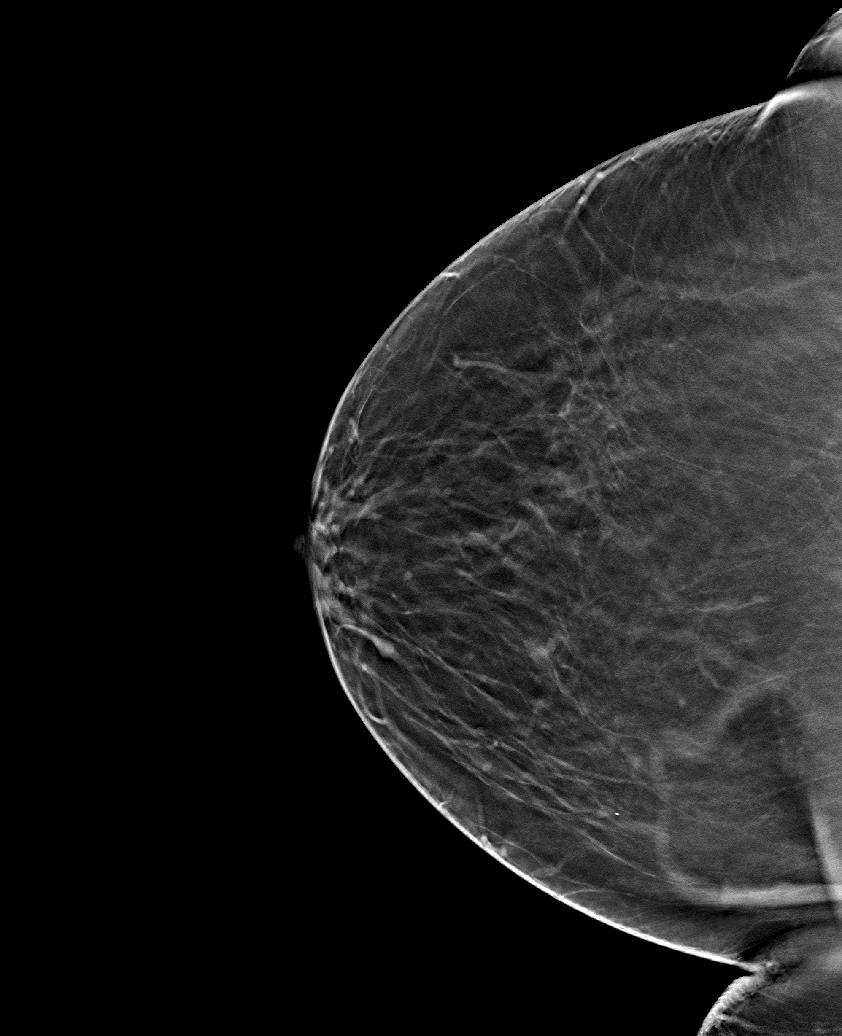

[6 of 25 positions shown; findings below may reference images not displayed]

FINDINGS: ACR Breast Density Category 2: There is a scattered fibroglandular
pattern.

No suspicious masses, architectural distortion, or calcifications
are present.

Images were processed with CAD.
IMPRESSION: No mammographic evidence of malignancy.

A result letter of this screening mammogram will be mailed directly
to the patient.

RECOMMENDATION:
Screening mammogram in one year. (Code:[OK])

BI-RADS CATEGORY 1:  Negative.

## 2013-04-23 ENCOUNTER — Telehealth: Payer: Self-pay | Admitting: Internal Medicine

## 2013-04-23 NOTE — Telephone Encounter (Signed)
Pt is having urine leakage and requesting a rx for adult pull up diapers, please send rx to [facility] per husband

## 2013-04-23 NOTE — Telephone Encounter (Signed)
Patient Information:  Caller Name: Nesa  Phone: [phone]  Patient: Cheryl, [name]  Gender: Female  DOB: 10/02/1941  Age: 72 Years  PCP: [name] (Adults only)  Office Follow Up:  Does the office need to follow up with this patient?: Yes  Instructions For The Office: PLS SEE RN NOTE  RN Note:  Pt has had incontinence issues for several months and would like RX for Brief/Depends. Pt would like to pick up RX so she may have filled at [facility]. Pt states her incontinence is uncontrollable during her sleep and happens daily w/ Cough or movement. All emergent symptoms ruled out per Urinary symptoms in [facility], see in 72 hrs due to gradual onset of difficulty controlling bladder.  Appt offered.  Pt has appt 7-30 and plans on discussing w/ Dr Lovell Sheehan then but would like RX before appt.  PLEASE DISCUSS W/ MD AND F/U W/ PT.  Symptoms  Reason For Call & Symptoms: Incontinence problems  Reviewed Health History In EMR: Yes  Reviewed Medications In EMR: Yes  Reviewed Allergies In EMR: Yes  Reviewed Surgeries / Procedures: Yes  Date of Onset of Symptoms: 04/09/2013  Guideline(s) Used:  No Protocol Available - Information Only  Disposition Per Guideline:   Discuss with PCP and Callback by Nurse Today  Reason For Disposition Reached:   Nursing judgment  Advice Given:  N/A  Patient Will Follow Care Advice:  YES

## 2013-04-23 NOTE — Telephone Encounter (Signed)
Pt informed again that [facility] had [facility] and no insurance nor medicare will pay for adult diapers- and they sell those in their retail store and she doesn't need order

## 2013-04-23 NOTE — Telephone Encounter (Signed)
Talked with [facility] and that is in the retail part of the store and doesn't need script- insurance nor medicare will pay.  Left message on machine for pt

## 2013-05-28 ENCOUNTER — Ambulatory Visit (INDEPENDENT_AMBULATORY_CARE_PROVIDER_SITE_OTHER): Payer: Medicare Other | Admitting: Internal Medicine

## 2013-05-28 ENCOUNTER — Encounter: Payer: Self-pay | Admitting: Internal Medicine

## 2013-05-28 VITALS — BP 140/80 | HR 72 | Temp 98.6°F | Resp 16 | Ht 68.5 in | Wt 284.0 lb

## 2013-05-28 DIAGNOSIS — E785 Hyperlipidemia, unspecified: Secondary | ICD-10-CM

## 2013-05-28 DIAGNOSIS — I10 Essential (primary) hypertension: Secondary | ICD-10-CM

## 2013-05-28 NOTE — Patient Instructions (Signed)
Weight loss Need to consider paleo diet

## 2013-05-28 NOTE — Progress Notes (Signed)
Subjective:    Patient ID: [name], female    DOB: [birth year], 72 y.o.   MRN: [id number]  HPI And has controlled blood pressure.  The patient has missed several doses of her Crestor therefore we will not monitor her cholesterol today.  We discussed the need to be compliant both with her diet as well as with the cholesterol medications to reduce her risk of heart attack and stroke as well as arthritic disease   Review of Systems  Constitutional: Negative for activity change, appetite change and fatigue.  HENT: Negative for ear pain, congestion, neck pain, postnasal drip and sinus pressure.   Eyes: Negative for redness and visual disturbance.  Respiratory: Negative for cough, shortness of breath and wheezing.   Gastrointestinal: Negative for abdominal pain and abdominal distention.  Genitourinary: Negative for dysuria, frequency and menstrual problem.  Musculoskeletal: Negative for myalgias, joint swelling and arthralgias.  Skin: Negative for rash and wound.  Neurological: Negative for dizziness, weakness and headaches.  Hematological: Negative for adenopathy. Does not bruise/bleed easily.  Psychiatric/Behavioral: Negative for sleep disturbance and decreased concentration.   Past Medical History  Diagnosis Date  . Hyperlipidemia   . Hypertension   . Obesity   . Diverticulitis hx of  . Diverticulosis   . Abdominal pain   . Constipation   . Colon adenoma   . Complication of anesthesia 2006-at [facility]    breathing problems-no BP med given prior  . Arthritis   . GERD (gastroesophageal reflux disease)   . Pneumonia 2005  . Numbness and tingling in right hand     pt. states has numbness of right hand very frequently-watch positioning    History   Social History  . Marital Status: Married    Spouse Name: N/A    Number of Children: N/A  . Years of Education: N/A   Occupational History  . Not on file.   Social History Main Topics  . Smoking status: Never Smoker   .  Smokeless tobacco: Not on file  . Alcohol Use: No  . Drug Use: No  . Sexually Active: Yes   Other Topics Concern  . Not on file   Social History Narrative  . No narrative on file    Past Surgical History  Procedure Laterality Date  . Skin draft for trauma    . Abdominal hysterectomy    . Bladder tack    . Colonoscopy [id number]    . Oophorectomy    . Ureter repair for tyransected left ureter    . Temporary ureter stent    . Total knee arthroplasty  01/05/2012    Procedure: TOTAL KNEE ARTHROPLASTY;  Surgeon: Loanne Drilling, MD;  Location: [facility];  Service: Orthopedics;  Laterality: Right;  . Joint replacement      Family History  Problem Relation Age of Onset  . Breast cancer Mother   . Prostate cancer Father   . Colon cancer Father     Allergies  Allergen Reactions  . Lipitor (Atorvastatin Calcium) Itching    Current Outpatient Prescriptions on File Prior to Visit  Medication Sig Dispense Refill  . amLODipine-benazepril (LOTREL) 5-20 MG per capsule TAKE ONE CAPSULE BY MOUTH EVERY DAY  30 capsule  10  . aspirin EC 81 MG tablet Take 81 mg by mouth daily.      Marland Kitchen atenolol-chlorthalidone (TENORETIC) 50-25 MG per tablet Take 1 tablet by mouth daily.  30 tablet  11  . famotidine (PEPCID) 20 MG tablet Take 20 mg by  mouth daily.      . rosuvastatin (CRESTOR) 20 MG tablet Take 20 mg by mouth at bedtime. 3 times a week       No current facility-administered medications on file prior to visit.    BP 140/80  Pulse 72  Temp(Src) 98.6 F (37 C)  Resp 16  Ht 5' 8.5" (1.74 m)  Wt 284 lb (128.822 kg)  BMI 42.55 kg/m2       Objective:   Physical Exam  Nursing note reviewed. Constitutional: She appears well-developed and well-nourished.  HENT:  Head: Normocephalic and atraumatic.  Cardiovascular: Normal rate and regular rhythm.   Murmur heard. Pulmonary/Chest: Effort normal and breath sounds normal.  Musculoskeletal: She exhibits edema and tenderness.  Skin:  Skin is warm and dry.          Assessment & Plan:  Again reinforced the need for weight loss through diet and exercise blood pressure is well controlled.  Reinforce the need to take Crestor 3 times a week as prescribed for lipid reduction.

## 2013-06-02 ENCOUNTER — Ambulatory Visit (INDEPENDENT_AMBULATORY_CARE_PROVIDER_SITE_OTHER): Payer: Medicare Other | Admitting: Nurse Practitioner

## 2013-06-02 ENCOUNTER — Telehealth: Payer: Self-pay | Admitting: Internal Medicine

## 2013-06-02 ENCOUNTER — Encounter: Payer: Self-pay | Admitting: Nurse Practitioner

## 2013-06-02 VITALS — BP 138/79 | HR 61 | Temp 97.6°F | Resp 18 | Ht 68.5 in | Wt 278.0 lb

## 2013-06-02 DIAGNOSIS — M792 Neuralgia and neuritis, unspecified: Secondary | ICD-10-CM

## 2013-06-02 DIAGNOSIS — Y92009 Unspecified place in unspecified non-institutional (private) residence as the place of occurrence of the external cause: Secondary | ICD-10-CM

## 2013-06-02 DIAGNOSIS — W19XXXA Unspecified fall, initial encounter: Secondary | ICD-10-CM

## 2013-06-02 DIAGNOSIS — IMO0002 Reserved for concepts with insufficient information to code with codable children: Secondary | ICD-10-CM

## 2013-06-02 MED ORDER — PREDNISONE 10 MG PO TABS: ORAL_TABLET | ORAL | Status: DC

## 2013-06-02 NOTE — Patient Instructions (Addendum)
Radicular Pain Radicular pain in either the arm or leg is usually from a bulging or herniated disk in the spine. A piece of the herniated disk may press against the nerves as the nerves exit the spine. This causes pain which is felt at the tips of the nerves down the arm or leg. Other causes of radicular pain may include:  Fractures.  Heart disease.  Cancer.  An abnormal and usually degenerative state of the nervous system or nerves (neuropathy). Diagnosis may require CT or MRI scanning to determine the primary cause.  Nerves that start at the neck (nerve roots) may cause radicular pain in the outer shoulder and arm. It can spread down to the thumb and fingers. The symptoms vary depending on which nerve root has been affected. In most cases radicular pain improves with conservative treatment. Neck problems may require physical therapy, a neck collar, or cervical traction. Treatment may take many weeks, and surgery may be considered if the symptoms do not improve.  Conservative treatment is also recommended for sciatica. Sciatica causes pain to radiate from the lower back or buttock area down the leg into the foot. Often there is a history of back problems. Most patients with sciatica are better after 2 to 4 weeks of rest and other supportive care. Short term bed rest can reduce the disk pressure considerably. Sitting, however, is not a good position since this increases the pressure on the disk. You should avoid bending, lifting, and all other activities which make the problem worse. Traction can be used in severe cases. Surgery is usually reserved for patients who do not improve within the first months of treatment. Only take over-the-counter or prescription medicines for pain, discomfort, or fever as directed by your caregiver. Narcotics and muscle relaxants may help by relieving more severe pain and spasm and by providing mild sedation. Cold or massage can give significant relief. Spinal manipulation  is not recommended. It can increase the degree of disc protrusion. Epidural steroid injections are often effective treatment for radicular pain. These injections deliver medicine to the spinal nerve in the space between the protective covering of the spinal cord and back bones (vertebrae). Your caregiver can give you more information about steroid injections. These injections are most effective when given within two weeks of the onset of pain.  You should see your caregiver for follow up care as recommended. A program for neck and back injury rehabilitation with stretching and strengthening exercises is an important part of management.  SEEK IMMEDIATE MEDICAL CARE IF:  You develop increased pain, weakness, or numbness in your arm or leg.  You develop difficulty with bladder or bowel control.  You develop abdominal pain. Document Released: 11/23/2004 Document Revised: 01/08/2012 Document Reviewed: 02/08/2009 Exit[employer] Patient Information 2014 [employer].  

## 2013-06-02 NOTE — Telephone Encounter (Signed)
Patient Information:  Caller Name: Analeise  Phone: [phone]  Patient: [name], [name]  Gender: Female  DOB: 1941/01/16  Age: 72 Years  PCP: [name] (Adults only)  Office Follow Up:  Does the office need to follow up with this patient?: No  Instructions For The Office: N/A  RN Note:  Describes "attacks" of pain lasting 5-7 minutes with severe throbbing and fingers feel cold; fingers feel cold now and finger nail beds appear whiter that left hand. Chronic numbness in fingers for > 1 yr; diagnosed with Carpal Tunnel syndrome. Due to painful lump in arm and intermittent severe, throbbing pain in fingers, advised to be seen today.  No appointments remain at [facility] office.  Scheduled at 1530 with Molly Maduro NP at [facility] office.  Symptoms  Reason For Call & Symptoms: Worsened intermittent throbbing pain in thumb and 1st two fingers of right hand. Arm is tender from elbow to shoulder for past year.   Intermittent finger pain present for > 1 year.  Pain worsened after last fall approximately two weeks ago.  Reviewed Health History In EMR: Yes  Reviewed Medications In EMR: Yes  Reviewed Allergies In EMR: Yes  Reviewed Surgeries / Procedures: Yes  Date of Onset of Symptoms: 05/19/2013  Guideline(s) Used:  Finger Pain  Disposition Per Guideline:   See Within 3 Days in Office  Reason For Disposition Reached:   Moderate pain (e.g., interferes with normal activities) and present > 3 days  Advice Given:  N/A  RN Overrode Recommendation:  Make Appointment  Advised to see MD today per nursing judgement.

## 2013-06-03 NOTE — Progress Notes (Signed)
Subjective:    [name] is a 72 y.o. female who presents for evaluation of right hand/finger pain after fall 2 weeks ago. Pt fell at home, landing on R shoulder. She has experienced gradually worsening R hand pain & parasthesia, as well as upper arm numbness and pain in shoulder at [facility] joint. Over the last 4 days, her pain in her hand-digits 1-3 has become worse and is keeping her awake at night. She feels the pain is aggravated by lying down and is unable to lay on R shoulder due to pain. She is not using prescription or OTC pain meds. Pain is aggravated by overhead movement of the arm. She rates her pain at 5/10 during day and 7/10 at night. There are no relieving factors. There is associated numbness in digits 1-3. Evaluation to date: none. Treatment to date: nothing specific. Of note, pt states she fell 1 year ago on same shoulder & was evaluated in ED. She also saw a specialist who told her she has carpal tunnel and suggested surgery. There are no notes for review for this encounter. 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  The following portions of the patient's history were reviewed and updated as appropriate: allergies, current medications, past medical history, past social history, past surgical history and problem list.  Review of Systems Constitutional: negative for anorexia, chills, fatigue, fevers, night sweats and weight loss Gastrointestinal: negative for abdominal pain, dyspepsia and PUD Integument/breast: negative for skin lesion(s) and bruising. Musculoskeletal:positive for neck pain and R shoulder, arm & hand pain, negative for back pain, myalgias and stiff joints Neurological: positive for paresthesia and numbness, negative for coordination problems, tremors and weakness  Endocrine: Negative for polyphagia, polydipsia, polyuria  Objective:    BP 138/79  Pulse 61  Temp(Src)  97.6 F (36.4 C) (Temporal)  Resp 18  Ht 5' 8.5" (1.74 m)  Wt 278 lb (126.1 kg)  BMI 41.65 kg/m2  SpO2 94% Right hand:  radial pulse normal, sensation normal and osteoarthritic changes  Left hand:  radial pulse normal and sensation normal  R shoulder: FROM, AC joint tenderness, pain in shoulder w/overhead extension of arm, Back: no spinal point tenderness, kyphosis     Assessment:  Radicular pain R arm   Plan:   Prednisone taper. Refer to orthopedist.

## 2013-08-11 ENCOUNTER — Encounter: Payer: Self-pay | Admitting: Internal Medicine

## 2013-08-19 ENCOUNTER — Encounter: Payer: Self-pay | Admitting: Internal Medicine

## 2013-09-01 ENCOUNTER — Telehealth: Payer: Self-pay | Admitting: Internal Medicine

## 2013-09-01 ENCOUNTER — Other Ambulatory Visit: Payer: Self-pay | Admitting: *Deleted

## 2013-09-01 DIAGNOSIS — S90851S Superficial foreign body, right foot, sequela: Secondary | ICD-10-CM

## 2013-09-01 NOTE — Telephone Encounter (Signed)
Pt states she has been experiencing pain in her foot for the past 2 mnths.  She seems to think that she has stepped on something that may be in foot.  The pain has now become unbearable and patient is having difficulty bearing weight on it.  Pt inquiring if she should come in to see Dr. Lovell Sheehan or if he is willing to refer her to a podiatrist.

## 2013-09-01 NOTE — Telephone Encounter (Signed)
Per dr Lovell Sheehan- may go to podatrist

## 2013-09-29 ENCOUNTER — Telehealth: Payer: Self-pay | Admitting: Internal Medicine

## 2013-09-29 NOTE — Telephone Encounter (Signed)
Appointment made

## 2013-09-29 NOTE — Telephone Encounter (Signed)
Patient Information:  Caller Name: Jaselynn  Phone: [phone]  Patient: [name], [name]  Gender: Female  DOB: June 14, 1941  Age: 72 Years  PCP: [name] (Adults only)  Office Follow Up:  Does the office need to follow up with this patient?: Yes  Instructions For The Office: Has go to office now disposition due to flank pain-Has cloudy white urine- Please call regarding possible work in appointment. No appointmentts left in [facility].   Symptoms  Reason For Call & Symptoms: Avanna thinks she has a UTI. States she had brown urine on 09/26/13. Urine cloudy white color on 09/29/2013. Had onset of intermittent right flak pain on 09/26/13. Rates pain an 8 on 1/10 pt scale. Pain makes her legs weak. Generally feels ill. Using walker and cane due to weakness. "States she has felt feverish "since 09/26/2013. 'Feels hot when she is in bed." Has not checked temperature. Per Urination pain protocol has go to office now disposition due to Flank pain is present. No Appt left in [facility] . Please call Josphine about possible work in appointment for today 09/29/12.  Reviewed Health History In EMR: Yes  Reviewed Medications In EMR: Yes  Reviewed Allergies In EMR: Yes  Reviewed Surgeries / Procedures: Yes  Date of Onset of Symptoms: 09/26/2013  Treatments Tried: Cranberry juice, cranberries and water  Treatments Tried Worked: No  Guideline(s) Used:  Urination Pain - Female  Disposition Per Guideline:   Go to Office Now  Reason For Disposition Reached:   Side (flank) or lower back pain present  Advice Given:  N/A  Patient Will Follow Care Advice:  YES

## 2013-09-30 ENCOUNTER — Encounter: Payer: Self-pay | Admitting: Internal Medicine

## 2013-09-30 ENCOUNTER — Ambulatory Visit (INDEPENDENT_AMBULATORY_CARE_PROVIDER_SITE_OTHER): Payer: Medicare Other | Admitting: Internal Medicine

## 2013-09-30 VITALS — BP 146/80 | HR 62 | Temp 98.3°F | Wt 273.0 lb

## 2013-09-30 DIAGNOSIS — N390 Urinary tract infection, site not specified: Secondary | ICD-10-CM | POA: Insufficient documentation

## 2013-09-30 DIAGNOSIS — Z9889 Other specified postprocedural states: Secondary | ICD-10-CM

## 2013-09-30 DIAGNOSIS — R300 Dysuria: Secondary | ICD-10-CM

## 2013-09-30 DIAGNOSIS — M2550 Pain in unspecified joint: Secondary | ICD-10-CM | POA: Insufficient documentation

## 2013-09-30 LAB — POCT URINALYSIS DIPSTICK
Bilirubin, UA: NEGATIVE
Blood, UA: NEGATIVE
Glucose, UA: NEGATIVE
Ketones, UA: NEGATIVE
Nitrite, UA: POSITIVE
Protein, UA: NEGATIVE
Spec Grav, UA: 1.015
Urobilinogen, UA: 0.2
pH, UA: 6

## 2013-09-30 MED ORDER — SULFAMETHOXAZOLE-TMP DS 800-160 MG PO TABS: 1.0000 | ORAL_TABLET | Freq: Two times a day (BID) | ORAL | Status: DC

## 2013-09-30 NOTE — Progress Notes (Signed)
Pre visit review using our clinic review tool, if applicable. No additional management support is needed unless otherwise documented below in the visit note. Chief Complaint  Patient presents with  . Dysuria    Started Friday.  . Hematuria  . Back Pain    HPI: Patient comes in today with husband sent in by: CAN  because of occasional brown urinary symptoms over the weekend with severe right flank pain. Now with urinary tract symptoms urgency dysuria. Denies redness in the urine. No current fever chills vomiting aches all over from cooking for 7 days.  Remote history of kidney iinfect  history of what sounds like ureteral surgery from a colon surgery mishap years ago. Hasn't had to see urology in a while on an as-needed basis. ROS: See pertinent positives and negatives per HPI. No chest pain shortness of breath syncope other bleeding.no nvd some swellign after eating extra salt with  holiday  Past Medical History  Diagnosis Date  . Hyperlipidemia   . Hypertension   . Obesity   . Diverticulitis hx of  . Diverticulosis   . Abdominal pain   . Constipation   . Colon adenoma   . Complication of anesthesia 2006-at [facility]    breathing problems-no BP med given prior  . Arthritis   . GERD (gastroesophageal reflux disease)   . Pneumonia 2005  . Numbness and tingling in right hand     pt. states has numbness of right hand very frequently-watch positioning   Past Surgical History  Procedure Laterality Date  . Skin draft for trauma    . Abdominal hysterectomy    . Bladder tack    . Colonoscopy [id number]    . Oophorectomy    . Ureter repair for tyransected left ureter    . Temporary ureter stent    . Total knee arthroplasty  01/05/2012    Procedure: TOTAL KNEE ARTHROPLASTY;  Surgeon: Loanne Drilling, MD;  Location: [facility];  Service: Orthopedics;  Laterality: Right;  . Joint replacement      Family History  Problem Relation Age of Onset  . Breast cancer Mother   . Prostate  cancer Father   . Colon cancer Father     History   Social History  . Marital Status: Married    Spouse Name: N/A    Number of Children: N/A  . Years of Education: N/A   Social History Main Topics  . Smoking status: Never Smoker   . Smokeless tobacco: None  . Alcohol Use: No  . Drug Use: No  . Sexual Activity: Yes   Other Topics Concern  . None   Social History Narrative  . None    Outpatient Encounter Prescriptions as of 09/30/2013  Medication Sig  . amLODipine-benazepril (LOTREL) 5-20 MG per capsule TAKE ONE CAPSULE BY MOUTH EVERY DAY  . aspirin EC 81 MG tablet Take 81 mg by mouth daily.  Marland Kitchen atenolol-chlorthalidone (TENORETIC) 50-25 MG per tablet Take 1 tablet by mouth daily.  . famotidine (PEPCID) 20 MG tablet Take 20 mg by mouth daily.  . rosuvastatin (CRESTOR) 20 MG tablet Take 20 mg by mouth at bedtime. 3 times a week  . sulfamethoxazole-trimethoprim (BACTRIM DS) 800-160 MG per tablet Take 1 tablet by mouth 2 (two) times daily.  . [DISCONTINUED] BOOSTRIX 5-2.5-18.5 injection   . [DISCONTINUED] predniSONE (DELTASONE) 10 MG tablet Take 4 T po qd for 3d, then 3T po qd for 3d, then 2T po qd for 3d, then 1T po qd for  3d, then 1/2 T po qd for 3 d, then d/c.    EXAM:  BP 146/80  Pulse 62  Temp(Src) 98.3 F (36.8 C) (Oral)  Wt 273 lb (123.832 kg)  SpO2 96%  Body mass index is 40.9 kg/(m^2).  GENERAL: vitals reviewed and listed above, alert, oriented, appears well hydrated and in no acute distress here with family member  HEENT: atraumatic, conjunctiva  clear, no obvious abnormalities on inspection of external nose and ears NECK: no obvious masses on inspection palpation  LUNGS: clear to auscultation bilaterally, no wheezes, rales or rhonchi, good air movement CV: HRRR, no clubbing cyanosis minimal to no peripheral edema nl cap refill  No severe flank pain abdomen soft MS: moves all extremities without noticeable acute focal  Abnormality Skin no obvious bruising or  bleeding. PSYCH: pleasant and cooperative, no obvious depression or anxiety  ASSESSMENT AND PLAN:  Discussed the following assessment and plan:  UTI (urinary tract infection) - Possible hematuria by history doubt stone; treat for infection based on urinalysis culture pending - Plan: Urine culture  Dysuria - Plan: POC Urinalysis Dipstick  Joint pain - No acute swelling redness. Has underlying arthritis.disc with PCP  S/P urological surgery History of ureteral surgery -Patient advised to return or notify health care team  if symptoms worsen or persist or new concerns arise.  Patient Instructions  i agree this acts like  bladder infection. Will notify you of culture results when back. Should be feeling better in 2-3 days  i fnot better at end of med contact us for advice.  If  persistent or progressive fu and consider urology check.    Neta Mends. Panosh M.D.

## 2013-09-30 NOTE — Patient Instructions (Signed)
i agree this acts like  bladder infection. Will notify you of culture results when back. Should be feeling better in 2-3 days  i fnot better at end of med contact us for advice.  If  persistent or progressive fu and consider urology check.

## 2013-10-03 LAB — URINE CULTURE: Colony Count: >=100000

## 2013-10-04 NOTE — Progress Notes (Signed)
Quick Note:  Tell patient that urine culture shows klebsiella a germ that can cause UTI sensitive to medication given . Should resolve with current treatment .FU if not better.as we discussed ______

## 2013-10-06 ENCOUNTER — Encounter: Payer: Self-pay | Admitting: Family Medicine

## 2013-10-11 ENCOUNTER — Other Ambulatory Visit: Payer: Self-pay | Admitting: Internal Medicine

## 2013-10-14 ENCOUNTER — Ambulatory Visit (AMBULATORY_SURGERY_CENTER): Payer: Self-pay | Admitting: *Deleted

## 2013-10-14 VITALS — Ht 68.5 in | Wt 275.2 lb

## 2013-10-14 DIAGNOSIS — Z860100 Personal history of colon polyps, unspecified: Secondary | ICD-10-CM

## 2013-10-14 MED ORDER — NA SULFATE-K SULFATE-MG SULF 17.5-3.13-1.6 GM/177ML PO SOLN: 1.0000 | Freq: Once | ORAL | Status: DC

## 2013-10-14 NOTE — Progress Notes (Signed)
No allergies to eggs or soy. Pt says she had a hard time waking up after 1 surgery.

## 2013-10-17 ENCOUNTER — Encounter: Payer: Self-pay | Admitting: Internal Medicine

## 2013-10-21 ENCOUNTER — Other Ambulatory Visit: Payer: Self-pay | Admitting: *Deleted

## 2013-10-21 ENCOUNTER — Telehealth: Payer: Self-pay | Admitting: Internal Medicine

## 2013-10-21 MED ORDER — INDOMETHACIN 50 MG PO CAPS: 50.0000 mg | ORAL_CAPSULE | Freq: Three times a day (TID) | ORAL | Status: DC

## 2013-10-21 NOTE — Telephone Encounter (Signed)
done

## 2013-10-21 NOTE — Telephone Encounter (Signed)
Padonda, I cant find indocin anywhere in chart- may I give to her ?  I know she has had gout in the past?

## 2013-10-21 NOTE — Telephone Encounter (Signed)
Pt needs new rx indomethacin 50 mg for gout. [facility] [place]. Please call pt on cellular. This med was last prescribed by dr plotnikov.

## 2013-11-04 ENCOUNTER — Telehealth: Payer: Self-pay | Admitting: Internal Medicine

## 2013-11-04 NOTE — Telephone Encounter (Signed)
Called pt back, pt states she has had a cold and last night was having heart palpitations,(palpitations are not new for pt) pt states she is also having trouble breathing this morning with some chest discomfort, pt denies fever at this time,pt wanted to know if she should start prep tonight, advised pt to see primary care dr or if she felt like her breathing was worse to go to ED, pt states she does not feel like it is bad enough to go to ED but she was going to call her primary and make an appt. Reschedule pt's colonoscopy to 11/27/13 at 9:30 am and updated date and times on prep instructions over the phone with pt. Pt read back dates and times, pt states understanding of instructions.-adm  If any further instructions needed please advise-adm

## 2013-11-05 ENCOUNTER — Encounter: Payer: Medicare Other | Admitting: Internal Medicine

## 2013-11-06 ENCOUNTER — Ambulatory Visit (INDEPENDENT_AMBULATORY_CARE_PROVIDER_SITE_OTHER): Payer: Medicare Other | Admitting: Family Medicine

## 2013-11-06 ENCOUNTER — Encounter: Payer: Self-pay | Admitting: Family Medicine

## 2013-11-06 ENCOUNTER — Telehealth: Payer: Self-pay | Admitting: Internal Medicine

## 2013-11-06 VITALS — BP 160/80 | HR 75 | Temp 98.6°F | Wt 279.0 lb

## 2013-11-06 DIAGNOSIS — I10 Essential (primary) hypertension: Secondary | ICD-10-CM

## 2013-11-06 NOTE — Progress Notes (Signed)
Pre visit review using our clinic review tool, if applicable. No additional management support is needed unless otherwise documented below in the visit note. 

## 2013-11-06 NOTE — Telephone Encounter (Signed)
Patient Information:  Caller Name: Catheleen Langhorne  Phone: [phone]  Patient: [name], [name]  Gender: Female  DOB: [birth year]  Age: 73 Years  PCP: Benay Pillow (Adults only)  Office Follow Up:  Does the office need to follow up with this patient?: No  Instructions For The Office: N/A   Symptoms  Reason For Call & Symptoms: Shelly/ spouse calling about Johnisha's BP.  224/124 at home 11/06/13 am.  She has been feeling poorly since 11/05/13.  Relates she took OTC cold medication for a few days prior to this.  Emergent symptoms ruled out. She also relates she ate [place] food and [employer] within the past few days.  See Today in Office per High Blood Pressure guideline due to BP > 180/110  Reviewed Health History In EMR: Yes  Reviewed Medications In EMR: Yes  Reviewed Allergies In EMR: Yes  Reviewed Surgeries / Procedures: Yes  Date of Onset of Symptoms: 11/05/2013  Treatments Tried: Taklng usual medications + Mucinex.  She took extra dose of medications on 11/05/13.  Treatments Tried Worked: No  Guideline(s) Used:  High Blood Pressure  Disposition Per Guideline:   See Today in Office  Reason For Disposition Reached:   BP > 180/110  Advice Given:  General:  Untreated high blood pressure may cause damage to the heart, brain, kidneys, and eyes.  Treatment of high blood pressure can reduce the risk of stroke, heart attack, and heart failure.  The goal of blood pressure treatment for most patients with hypertension is to keep the blood pressure under 140/90.  Lifestyle Changes  Maintain a healthy weight. Lose weight if you are overweight.  Do 30 minutes of aerobic physical activity (e.g., brisk walking) most days of the week.  Eat a diet high in fresh fruits and low-fat dairy products. Limit your intake of saturated and total fat. Choose foods that are lower in salt.  Call Back If:  Headache, blurred vision, difficulty talking, or difficulty walking occurs  Chest pain or  difficulty breathing occurs  You become worse.  Patient Will Follow Care Advice:  YES  Appointment Scheduled:  11/06/2013 11:00:00 Appointment Scheduled Provider:  Alysia [facility])

## 2013-11-07 ENCOUNTER — Encounter: Payer: Self-pay | Admitting: Family Medicine

## 2013-11-07 NOTE — Progress Notes (Signed)
   Subjective:    Patient ID: [name], female    DOB: [birth year], 73 y.o.   MRN: [id number]  HPI Here for elevated BP. This had been well controlled at home with BP of around 140/80 until yesterday when it jumped up to 200/124. Then it settled down to 170/100 last night and this am at home it was 160/90. She denies any HA or chest pain or SOB. She admits to eating a meal of [place] take out food 2 nights ago and she has been taking an OTC cold medication for 3 days which contains phenylephrine. Today she feels fine and has not taken any OTC meds.    Review of Systems  Respiratory: Negative.   Cardiovascular: Negative.   Neurological: Negative.        Objective:   Physical Exam  Constitutional: She is oriented to person, place, and time. She appears well-developed and well-nourished.  Pulmonary/Chest: Effort normal and breath sounds normal.  Abdominal: Soft. Bowel sounds are normal.  Neurological: She is alert and oriented to person, place, and time.          Assessment & Plan:  It looks like her HTN is well controlled for the most part. I think her high sodium meal 2 days ago and the OTC cold meds combined to run the BP up, and now she is back to normal. She will watch the BP at home and follow up prn.

## 2013-11-07 NOTE — Addendum Note (Signed)
Addended by: Lowry Ram on: 11/07/2013 01:42 PM   Modules accepted: Level of Service

## 2013-11-10 ENCOUNTER — Other Ambulatory Visit: Payer: Self-pay | Admitting: Internal Medicine

## 2013-11-14 ENCOUNTER — Other Ambulatory Visit: Payer: Self-pay | Admitting: *Deleted

## 2013-11-14 ENCOUNTER — Telehealth: Payer: Self-pay | Admitting: Internal Medicine

## 2013-11-14 MED ORDER — AMLODIPINE BESY-BENAZEPRIL HCL 5-20 MG PO CAPS: ORAL_CAPSULE | ORAL | Status: DC

## 2013-11-14 NOTE — Telephone Encounter (Signed)
Pt requesting refill of amLODipine-benazepril (LOTREL) 5-20 MG per capsule states pharmacy told her PCP didn't approve.  [place]

## 2013-11-14 NOTE — Telephone Encounter (Signed)
Done

## 2013-11-21 ENCOUNTER — Other Ambulatory Visit: Payer: Self-pay | Admitting: *Deleted

## 2013-11-21 ENCOUNTER — Ambulatory Visit (INDEPENDENT_AMBULATORY_CARE_PROVIDER_SITE_OTHER): Payer: Medicare Other | Admitting: Internal Medicine

## 2013-11-21 ENCOUNTER — Encounter: Payer: Self-pay | Admitting: Internal Medicine

## 2013-11-21 VITALS — BP 146/80 | HR 80 | Temp 98.0°F | Resp 18 | Ht 68.5 in | Wt 278.0 lb

## 2013-11-21 DIAGNOSIS — I10 Essential (primary) hypertension: Secondary | ICD-10-CM

## 2013-11-21 DIAGNOSIS — E785 Hyperlipidemia, unspecified: Secondary | ICD-10-CM

## 2013-11-21 DIAGNOSIS — Z23 Encounter for immunization: Secondary | ICD-10-CM

## 2013-11-21 DIAGNOSIS — T887XXA Unspecified adverse effect of drug or medicament, initial encounter: Secondary | ICD-10-CM

## 2013-11-21 LAB — BASIC METABOLIC PANEL
BUN: 26 mg/dL — ABNORMAL HIGH (ref 6–23)
CO2: 27 mEq/L (ref 19–32)
Calcium: 9.3 mg/dL (ref 8.4–10.5)
Chloride: 106 mEq/L (ref 96–112)
Creatinine, Ser: 1.1 mg/dL (ref 0.4–1.2)
GFR: 65.62 mL/min (ref >60.00)
Glucose, Bld: 104 mg/dL — ABNORMAL HIGH (ref 70–99)
Potassium: 4 mEq/L (ref 3.5–5.1)
Sodium: 141 mEq/L (ref 135–145)

## 2013-11-21 LAB — CBC WITH DIFFERENTIAL/PLATELET
Basophils Absolute: 0.1 10*3/uL (ref 0.0–0.1)
Basophils Relative: 0.5 % (ref 0.0–3.0)
Eosinophils Absolute: 0.4 10*3/uL (ref 0.0–0.7)
Eosinophils Relative: 4.6 % (ref 0.0–5.0)
HCT: 43 % (ref 36.0–46.0)
Hemoglobin: 14.5 g/dL (ref 12.0–15.0)
Lymphocytes Relative: 30.9 % (ref 12.0–46.0)
Lymphs Abs: 3 10*3/uL (ref 0.7–4.0)
MCHC: 33.8 g/dL (ref 30.0–36.0)
MCV: 89 fl (ref 78.0–100.0)
Monocytes Absolute: 0.6 10*3/uL (ref 0.1–1.0)
Monocytes Relative: 6.2 % (ref 3.0–12.0)
Neutro Abs: 5.6 10*3/uL (ref 1.4–7.7)
Neutrophils Relative %: 57.8 % (ref 43.0–77.0)
Platelets: 232 10*3/uL (ref 150.0–400.0)
RBC: 4.82 Mil/uL (ref 3.87–5.11)
RDW: 13.3 % (ref 11.5–14.6)
WBC: 9.7 10*3/uL (ref 4.5–10.5)

## 2013-11-21 LAB — LIPID PANEL
Cholesterol: 225 mg/dL — ABNORMAL HIGH (ref 0–200)
HDL: 44.6 mg/dL (ref >39.00)
Total CHOL/HDL Ratio: 5
Triglycerides: 141 mg/dL (ref 0.0–149.0)
VLDL: 28.2 mg/dL (ref 0.0–40.0)

## 2013-11-21 LAB — HEPATIC FUNCTION PANEL
ALT: 32 U/L (ref 0–35)
AST: 32 U/L (ref 0–37)
Albumin: 3.9 g/dL (ref 3.5–5.2)
Alkaline Phosphatase: 40 U/L (ref 39–117)
Bilirubin, Direct: 0.1 mg/dL (ref 0.0–0.3)
Total Bilirubin: 0.8 mg/dL (ref 0.3–1.2)
Total Protein: 7.3 g/dL (ref 6.0–8.3)

## 2013-11-21 LAB — TSH: TSH: 2.06 u[IU]/mL (ref 0.35–5.50)

## 2013-11-21 LAB — LDL CHOLESTEROL, DIRECT: Direct LDL: 162.2 mg/dL

## 2013-11-21 NOTE — Progress Notes (Signed)
Pre visit review using our clinic review tool, if applicable. No additional management support is needed unless otherwise documented below in the visit note. 

## 2013-11-21 NOTE — Patient Instructions (Signed)
Calorie Counting Diet A calorie counting diet requires you to eat the number of calories that are right for you in a day. Calories are the measurement of how much energy you get from the food you eat. Eating the right amount of calories is important for staying at a healthy weight. If you eat too many calories, your body will store them as fat and you may gain weight. If you eat too few calories, you may lose weight. Counting the number of calories you eat during a day will help you know if you are eating the right amount. A Registered Dietitian can determine how many calories you need in a day. The amount of calories needed varies from person to person. If your goal is to lose weight, you will need to eat fewer calories. Losing weight can benefit you if you are overweight or have health problems such as heart disease, high blood pressure, or diabetes. If your goal is to gain weight, you will need to eat more calories. Gaining weight may be necessary if you have a certain health problem that causes your body to need more energy. TIPS Whether you are increasing or decreasing the number of calories you eat during a day, it may be hard to get used to changes in what you eat and drink. The following are tips to help you keep track of the number of calories you eat.  Measure foods at home with measuring cups. This helps you know the amount of food and number of calories you are eating.  Restaurants often serve food in amounts that are larger than 1 serving. While eating out, estimate how many servings of a food you are given. For example, a serving of cooked rice is  cup or about the size of half of a fist. Knowing serving sizes will help you be aware of how much food you are eating at restaurants.  Ask for smaller portion sizes or child-size portions at restaurants.  Plan to eat half of a meal at a restaurant. Take the rest home or share the other half with a friend.  Read the Nutrition Facts panel on  food labels for calorie content and serving size. You can find out how many servings are in a package, the size of a serving, and the number of calories each serving has.  For example, a package might contain 3 cookies. The Nutrition Facts panel on that package says that 1 serving is 1 cookie. Below that, it will say there are 3 servings in the container. The calories section of the Nutrition Facts label says there are 90 calories. This means there are 90 calories in 1 cookie (1 serving). If you eat 1 cookie you have eaten 90 calories. If you eat all 3 cookies, you have eaten 270 calories (3 servings x 90 calories = 270 calories). The list below tells you how big or small some common portion sizes are.  1 oz.........4 stacked dice.  3 oz.........Deck of cards.  1 tsp........Tip of little finger.  1 tbs........Thumb.  2 tbs........Golf ball.   cup.......Half of a fist.  1 cup........A fist. KEEP A FOOD LOG Write down every food item you eat, the amount you eat, and the number of calories in each food you eat during the day. At the end of the day, you can add up the total number of calories you have eaten. It may help to keep a list like the one below. Find out the calorie information by reading the   Nutrition Facts panel on food labels. Breakfast  Bran cereal (1 cup, 110 calories).  Fat-free milk ( cup, 45 calories). Snack  Apple (1 medium, 80 calories). Lunch  Spinach (1 cup, 20 calories).  Tomato ( medium, 20 calories).  Chicken breast strips (3 oz, 165 calories).  Shredded cheddar cheese ( cup, 110 calories).  Light Italian dressing (2 tbs, 60 calories).  Whole-wheat bread (1 slice, 80 calories).  Tub margarine (1 tsp, 35 calories).  Vegetable soup (1 cup, 160 calories). Dinner  Pork chop (3 oz, 190 calories).  Brown rice (1 cup, 215 calories).  Steamed broccoli ( cup, 20 calories).  Strawberries (1  cup, 65 calories).  Whipped cream (1 tbs, 50  calories). Daily Calorie Total: 1425 Document Released: 10/16/2005 Document Revised: 01/08/2012 Document Reviewed: 04/12/2007 Exit[employer] Patient Information 2014 [employer].  

## 2013-11-21 NOTE — Addendum Note (Signed)
Addended by: Allyne Gee on: 11/21/2013 11:36 AM   Modules accepted: Orders

## 2013-11-21 NOTE — Progress Notes (Signed)
   Subjective:    Patient ID: [name], female    DOB: November 15, 1940, 73 y.o.   MRN: [id number]  HPI She presents today to discuss immunizations and also for chronic problems with her health maintenance including her use of Crestor for hyperlipidemia A history of diverticulitis with chronic diverticulosis.  A history of hypertension And a history of osteoarthritis of the knee and spine. Had TKR  Right and need to have the left knee replaced  Has moderately severe hand pain on the right Has bee diagnosed with neuropathy and was started on B6 and had a shot in the shoulder The specialist felt that the pain was coming from the neck?  She had a EMG and NSV  Had proposed carple tunnel Knee before the carple tunnel surgery  Weight management   Still 278!  Morbid obesity is a significant issues       Review of Systems  Constitutional: Negative for activity change, appetite change and fatigue.       Morbid obesity  HENT: Negative for congestion, ear pain, postnasal drip and sinus pressure.   Eyes: Negative for redness and visual disturbance.  Respiratory: Negative for cough, shortness of breath and wheezing.   Gastrointestinal: Negative for abdominal pain and abdominal distention.  Genitourinary: Negative for dysuria, frequency and menstrual problem.  Musculoskeletal: Negative for arthralgias, joint swelling, myalgias and neck pain.  Skin: Negative for rash and wound.  Neurological: Negative for dizziness, weakness and headaches.  Hematological: Negative for adenopathy. Does not bruise/bleed easily.  Psychiatric/Behavioral: Negative for sleep disturbance and decreased concentration.       Objective:   Physical Exam  Constitutional: She is oriented to person, place, and time. She appears well-developed and well-nourished. No distress.  HENT:  Head: Normocephalic and atraumatic.  Right Ear: External ear normal.  Left Ear: External ear normal.  Nose: Nose normal.    Mouth/Throat: Oropharynx is clear and moist.  Eyes: Conjunctivae and EOM are normal. Pupils are equal, round, and reactive to light.  Neck: Normal range of motion. Neck supple. No JVD present. No tracheal deviation present. No thyromegaly present.  Cardiovascular: Normal rate, regular rhythm and intact distal pulses.   Murmur heard. Pulmonary/Chest: Effort normal and breath sounds normal. She has no wheezes. She exhibits no tenderness.  Abdominal: Soft. Bowel sounds are normal.  Musculoskeletal: Normal range of motion. She exhibits tenderness. She exhibits no edema.  Lymphadenopathy:    She has no cervical adenopathy.  Neurological: She is alert and oriented to person, place, and time. She has normal reflexes. No cranial nerve deficit.  Skin: Skin is warm and dry. She is not diaphoretic.  Psychiatric: She has a normal mood and affect. Her behavior is normal.          Assessment & Plan:  We are going to monitor appropriate blood work looking at her lipid  And  liver looking at a CBC differential and looking at a basic metabolic panel for hypertension.

## 2013-11-21 NOTE — Addendum Note (Signed)
Addended by: Keisa Blow M on: 11/21/2013 06:08 PM   Modules accepted: Orders  

## 2013-11-24 ENCOUNTER — Telehealth: Payer: Self-pay | Admitting: Internal Medicine

## 2013-11-24 NOTE — Telephone Encounter (Signed)
Relevant patient education assigned to patient using Emmi. ° °

## 2013-11-27 ENCOUNTER — Encounter: Payer: Medicare Other | Admitting: Internal Medicine

## 2013-11-27 ENCOUNTER — Encounter: Payer: Self-pay | Admitting: Internal Medicine

## 2013-12-09 ENCOUNTER — Encounter: Payer: Self-pay | Admitting: Internal Medicine

## 2013-12-23 ENCOUNTER — Ambulatory Visit (AMBULATORY_SURGERY_CENTER): Payer: Medicare Other | Admitting: Internal Medicine

## 2013-12-23 ENCOUNTER — Encounter: Payer: Self-pay | Admitting: Internal Medicine

## 2013-12-23 VITALS — BP 123/76 | HR 56 | Temp 97.4°F | Resp 16 | Ht 68.5 in | Wt 278.0 lb

## 2013-12-23 DIAGNOSIS — D126 Benign neoplasm of colon, unspecified: Secondary | ICD-10-CM

## 2013-12-23 DIAGNOSIS — K5730 Diverticulosis of large intestine without perforation or abscess without bleeding: Secondary | ICD-10-CM

## 2013-12-23 DIAGNOSIS — Z86010 Personal history of colonic polyps: Secondary | ICD-10-CM

## 2013-12-23 MED ORDER — SODIUM CHLORIDE 0.9 % IV SOLN: 500.0000 mL | INTRAVENOUS | Status: DC

## 2013-12-23 NOTE — Progress Notes (Signed)
Report to pacu rn, vss, bbs=clear 

## 2013-12-23 NOTE — Progress Notes (Signed)
No egg or soy allergy. ewm Past sedation pt hard to wake and she had trouble breathing with one. ? Etiology. ewm

## 2013-12-23 NOTE — Patient Instructions (Addendum)
I found and removed one very tiny polyp - do not worry - it looks benign. You still have some diverticulosis. Everything else was ok.  I will let you know pathology results and when to have another routine colonoscopy by mail.  I appreciate the opportunity to care for you. Gatha Mayer, MD, FACG  YOU HAD AN ENDOSCOPIC PROCEDURE TODAY AT [facility]: Refer to the procedure report that was given to you for any specific questions about what was found during the examination.  If the procedure report does not answer your questions, please call your gastroenterologist to clarify.  If you requested that your care partner not be given the details of your procedure findings, then the procedure report has been included in a sealed envelope for you to review at your convenience later.  YOU SHOULD EXPECT: Some feelings of bloating in the abdomen. Passage of more gas than usual.  Walking can help get rid of the air that was put into your GI tract during the procedure and reduce the bloating. If you had a lower endoscopy (such as a colonoscopy or flexible sigmoidoscopy) you may notice spotting of blood in your stool or on the toilet paper. If you underwent a bowel prep for your procedure, then you may not have a normal bowel movement for a few days.  DIET: Your first meal following the procedure should be a light meal and then it is ok to progress to your normal diet.  A half-sandwich or bowl of soup is an example of a good first meal.  Heavy or fried foods are harder to digest and may make you feel nauseous or bloated.  Likewise meals heavy in dairy and vegetables can cause extra gas to form and this can also increase the bloating.  Drink plenty of fluids but you should avoid alcoholic beverages for 24 hours.  ACTIVITY: Your care partner should take you home directly after the procedure.  You should plan to take it easy, moving slowly for the rest of the day.  You can resume normal activity the  day after the procedure however you should NOT DRIVE or use heavy machinery for 24 hours (because of the sedation medicines used during the test).    SYMPTOMS TO REPORT IMMEDIATELY: A gastroenterologist can be reached at any hour.  During normal business hours, 8:30 AM to 5:00 PM Monday through Friday, call [phone].  After hours and on weekends, please call the GI answering service at [phone] who will take a message and have the physician on call contact you.   Following lower endoscopy (colonoscopy or flexible sigmoidoscopy):  Excessive amounts of blood in the stool  Significant tenderness or worsening of abdominal pains  Swelling of the abdomen that is new, acute  Fever of 100F or higher   FOLLOW UP: If any biopsies were taken you will be contacted by phone or by letter within the next 1-3 weeks.  Call your gastroenterologist if you have not heard about the biopsies in 3 weeks.  Our staff will call the home number listed on your records the next business day following your procedure to check on you and address any questions or concerns that you may have at that time regarding the information given to you following your procedure. This is a courtesy call and so if there is no answer at the home number and we have not heard from you through the emergency physician on call, we will assume that you have  returned to your regular daily activities without incident.  SIGNATURES/CONFIDENTIALITY: You and/or your care partner have signed paperwork which will be entered into your electronic medical record.  These signatures attest to the fact that that the information above on your After Visit Summary has been reviewed and is understood.  Full responsibility of the confidentiality of this discharge information lies with you and/or your care-partner.  Recommendations Timing of repeat colonoscopy will be determined by pathology findings- 5-10 years

## 2013-12-23 NOTE — Progress Notes (Signed)
Called to room to assist during endoscopic procedure.  Patient ID and intended procedure confirmed with present staff. Received instructions for my participation in the procedure from the performing physician.  

## 2013-12-23 NOTE — Op Note (Signed)
[place]  [address] [place], [id number]   COLONOSCOPY PROCEDURE REPORT  PATIENT: Cheryl, [name]  MR#: [id number] BIRTHDATE: 09-05-1941 , 72  yrs. old GENDER: Female ENDOSCOPIST: Gatha Mayer, MD, [facility] PROCEDURE DATE:  12/23/2013 PROCEDURE:   Colonoscopy with biopsy First Screening Colonoscopy - Avg.  risk and is 50 yrs.  old or older - No.  Prior Negative Screening - Now for repeat screening. N/A  History of Adenoma - Now for follow-up colonoscopy & has been > or = to 3 yrs.  Yes hx of adenoma.  Has been 3 or more years since last colonoscopy.  Polyps Removed Today? Yes. ASA CLASS:   Class III INDICATIONS:Patient's personal history of adenomatous colon polyps.  MEDICATIONS: Propofol (Diprivan) 180 mg IV, MAC sedation, administered by CRNA, and These medications were titrated to patient response per physician's verbal order  DESCRIPTION OF PROCEDURE:   After the risks benefits and alternatives of the procedure were thoroughly explained, informed consent was obtained.  A digital rectal exam revealed no abnormalities of the rectum.   The [id number]  endoscope was introduced through the anus and advanced to the cecum, which was identified by both the appendix and ileocecal valve. No adverse events experienced.   The quality of the prep was excellent using Suprep  The instrument was then slowly withdrawn as the colon was fully examined.   COLON FINDINGS: A sessile polyp measuring 1-2 mm in size was found in the transverse colon.  A polypectomy was performed with cold forceps.  The resection was complete and the polyp tissue was completely retrieved.   Diverticulosis was noted in the sigmoid colon.   There was evidence of a prior end-to-side colo-colonic surgical anastomosis in the sigmoid colon.   The colon mucosa was otherwise normal.  Retroflexed views revealed no abnormalities. The time to cecum=1 minutes 26 seconds.  Withdrawal time=8 minutes  59 seconds.  The scope was withdrawn and the procedure completed. COMPLICATIONS: There were no complications.  ENDOSCOPIC IMPRESSION: 1.   Sessile polyp measuring 1-2 mm in size was found in the transverse colon; polypectomy was performed with cold forceps 2.   Diverticulosis was noted in the sigmoid colon 3.   There was evidence of a prior colo-colonic surgical anastomosis in the sigmoid colon 4.   The colon mucosa was otherwise normal - excellent prep - hx diminutive adenoma removal 2009  RECOMMENDATIONS: 1.  Timing of repeat colonoscopy will be determined by pathology findings -   5-10 years  eSigned:  Gatha Mayer, MD, [facility] 12/23/2013 3:54 PM  cc: The Patient

## 2013-12-24 ENCOUNTER — Telehealth: Payer: Self-pay | Admitting: *Deleted

## 2013-12-24 NOTE — Telephone Encounter (Signed)
  Follow up Call-  Call back number 12/23/2013  Post procedure Call Back phone  # [phone]  Permission to leave phone message Yes     Patient questions:  Do you have a fever, pain , or abdominal swelling? no Pain Score  0 *  Have you tolerated food without any problems? yes  Have you been able to return to your normal activities? yes  Do you have any questions about your discharge instructions: Diet   no Medications  no Follow up visit  no  Do you have questions or concerns about your Care? no  Actions: * If pain score is 4 or above: No action needed, pain <4.

## 2013-12-30 ENCOUNTER — Encounter: Payer: Self-pay | Admitting: Internal Medicine

## 2013-12-30 NOTE — Progress Notes (Signed)
Quick Note:  Diminutive adenoma Repeat colonoscopy 2020 ______ 

## 2013-12-31 ENCOUNTER — Encounter: Payer: Self-pay | Admitting: *Deleted

## 2014-01-19 ENCOUNTER — Telehealth: Payer: Self-pay | Admitting: Internal Medicine

## 2014-01-19 ENCOUNTER — Encounter: Payer: Self-pay | Admitting: *Deleted

## 2014-01-19 NOTE — Telephone Encounter (Signed)
Pt's spouse calling in to request a note to dismiss pt from court in April, spouse did not have actual date that pt is to be in court for duty.  Pt's spouse states that she will not be able to sit in court due to a recent knee replacement.  Pt's spouse would like note faxed to job [phone]. Please advise.

## 2014-01-19 NOTE — Telephone Encounter (Signed)
Ok per Dr Arnoldo Morale.  Can not fax letter due to HIPPA laws, note up front for p/u.  Left message on Shelly's voicemail to pick up

## 2014-04-09 ENCOUNTER — Telehealth: Payer: Self-pay | Admitting: Internal Medicine

## 2014-04-09 NOTE — Telephone Encounter (Signed)
Pt  And her hus would like to est with dr fry.Can I sch?

## 2014-04-10 NOTE — Telephone Encounter (Signed)
Sorry but I am too full  

## 2014-04-10 NOTE — Telephone Encounter (Signed)
Pt is aware.  

## 2014-04-16 ENCOUNTER — Other Ambulatory Visit: Payer: Self-pay

## 2014-04-16 DIAGNOSIS — Z1231 Encounter for screening mammogram for malignant neoplasm of breast: Secondary | ICD-10-CM

## 2014-04-24 ENCOUNTER — Ambulatory Visit
Admission: RE | Admit: 2014-04-24 | Discharge: 2014-04-24 | Disposition: A | Discharge status: Home / Self Care | Payer: Medicare Other | Source: Ambulatory Visit

## 2014-04-24 ENCOUNTER — Other Ambulatory Visit: Payer: Self-pay | Admitting: Orthopedic Surgery

## 2014-04-24 DIAGNOSIS — Z1231 Encounter for screening mammogram for malignant neoplasm of breast: Secondary | ICD-10-CM

## 2014-05-07 ENCOUNTER — Ambulatory Visit (HOSPITAL_COMMUNITY): Admission: RE | Admit: 2014-05-07 | Payer: Medicare Other | Source: Ambulatory Visit | Admitting: Orthopedic Surgery

## 2014-05-07 ENCOUNTER — Encounter (HOSPITAL_COMMUNITY): Admission: RE | Payer: Self-pay | Source: Ambulatory Visit

## 2014-05-07 SURGERY — CARPAL TUNNEL RELEASE
Anesthesia: Moderate Sedation | Laterality: Right

## 2014-05-22 ENCOUNTER — Encounter: Payer: Medicare Other | Admitting: Family

## 2014-05-29 ENCOUNTER — Encounter (HOSPITAL_BASED_OUTPATIENT_CLINIC_OR_DEPARTMENT_OTHER): Payer: Self-pay | Admitting: *Deleted

## 2014-05-29 NOTE — Progress Notes (Signed)
05/29/14 1127  OBSTRUCTIVE SLEEP APNEA  Have you ever been diagnosed with sleep apnea through a sleep study? No  Do you snore loudly (loud enough to be heard through closed doors)?  0  Do you often feel tired, fatigued, or sleepy during the daytime? 0  Has anyone observed you stop breathing during your sleep? 0  Do you have, or are you being treated for high blood pressure? 1  BMI more than 35 kg/m2? 1  Age over 72 years old? 1  Neck circumference greater than 40 cm/16 inches? 1  Gender: 0  Obstructive Sleep Apnea Score 4  Score 4 or greater  Results sent to PCP

## 2014-05-29 NOTE — Progress Notes (Signed)
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

## 2014-06-03 ENCOUNTER — Encounter (HOSPITAL_BASED_OUTPATIENT_CLINIC_OR_DEPARTMENT_OTHER)
Admission: RE | Admit: 2014-06-03 | Discharge: 2014-06-03 | Disposition: A | Discharge status: Home / Self Care | Payer: Medicare Other | Source: Ambulatory Visit | Attending: Orthopedic Surgery | Admitting: Orthopedic Surgery

## 2014-06-03 ENCOUNTER — Other Ambulatory Visit: Payer: Self-pay

## 2014-06-03 DIAGNOSIS — M129 Arthropathy, unspecified: Secondary | ICD-10-CM | POA: Diagnosis not present

## 2014-06-03 DIAGNOSIS — E785 Hyperlipidemia, unspecified: Secondary | ICD-10-CM | POA: Diagnosis not present

## 2014-06-03 DIAGNOSIS — Z7982 Long term (current) use of aspirin: Secondary | ICD-10-CM | POA: Diagnosis not present

## 2014-06-03 DIAGNOSIS — I10 Essential (primary) hypertension: Secondary | ICD-10-CM | POA: Diagnosis not present

## 2014-06-03 DIAGNOSIS — E669 Obesity, unspecified: Secondary | ICD-10-CM | POA: Diagnosis not present

## 2014-06-03 DIAGNOSIS — G5600 Carpal tunnel syndrome, unspecified upper limb: Secondary | ICD-10-CM | POA: Diagnosis not present

## 2014-06-03 DIAGNOSIS — K219 Gastro-esophageal reflux disease without esophagitis: Secondary | ICD-10-CM | POA: Diagnosis not present

## 2014-06-03 DIAGNOSIS — Z888 Allergy status to other drugs, medicaments and biological substances status: Secondary | ICD-10-CM | POA: Diagnosis not present

## 2014-06-03 DIAGNOSIS — Z885 Allergy status to narcotic agent status: Secondary | ICD-10-CM | POA: Diagnosis not present

## 2014-06-03 LAB — URINE MICROSCOPIC-ADD ON

## 2014-06-03 LAB — BASIC METABOLIC PANEL
Anion gap: 14 (ref 5–15)
BUN: 22 mg/dL (ref 6–23)
CO2: 24 mEq/L (ref 19–32)
Calcium: 9.1 mg/dL (ref 8.4–10.5)
Chloride: 105 mEq/L (ref 96–112)
Creatinine, Ser: 1.05 mg/dL (ref 0.50–1.10)
GFR calc Af Amer: 60 mL/min — ABNORMAL LOW (ref >90)
GFR calc non Af Amer: 52 mL/min — ABNORMAL LOW (ref >90)
Glucose, Bld: 138 mg/dL — ABNORMAL HIGH (ref 70–99)
Potassium: 4 mEq/L (ref 3.7–5.3)
Sodium: 143 mEq/L (ref 137–147)

## 2014-06-03 LAB — URINALYSIS, ROUTINE W REFLEX MICROSCOPIC
Bilirubin Urine: NEGATIVE
Glucose, UA: NEGATIVE mg/dL
Hgb urine dipstick: NEGATIVE
Ketones, ur: NEGATIVE mg/dL
Nitrite: POSITIVE — AB
Protein, ur: NEGATIVE mg/dL
Specific Gravity, Urine: 1.018 (ref 1.005–1.030)
Urobilinogen, UA: 1 mg/dL (ref 0.0–1.0)
pH: 6 (ref 5.0–8.0)

## 2014-06-04 ENCOUNTER — Encounter (HOSPITAL_BASED_OUTPATIENT_CLINIC_OR_DEPARTMENT_OTHER): Payer: Self-pay

## 2014-06-04 ENCOUNTER — Encounter (HOSPITAL_BASED_OUTPATIENT_CLINIC_OR_DEPARTMENT_OTHER)
Admission: RE | Disposition: A | Discharge status: Home / Self Care | Payer: Self-pay | Source: Ambulatory Visit | Attending: Orthopedic Surgery

## 2014-06-04 ENCOUNTER — Encounter (HOSPITAL_BASED_OUTPATIENT_CLINIC_OR_DEPARTMENT_OTHER): Payer: Medicare Other | Admitting: Anesthesiology

## 2014-06-04 ENCOUNTER — Ambulatory Visit (HOSPITAL_BASED_OUTPATIENT_CLINIC_OR_DEPARTMENT_OTHER)
Admission: RE | Admit: 2014-06-04 | Discharge: 2014-06-04 | Disposition: A | Discharge status: Home / Self Care | Payer: Medicare Other | Source: Ambulatory Visit | Attending: Orthopedic Surgery | Admitting: Orthopedic Surgery

## 2014-06-04 ENCOUNTER — Ambulatory Visit (HOSPITAL_BASED_OUTPATIENT_CLINIC_OR_DEPARTMENT_OTHER): Payer: Medicare Other | Admitting: Anesthesiology

## 2014-06-04 DIAGNOSIS — M129 Arthropathy, unspecified: Secondary | ICD-10-CM | POA: Diagnosis not present

## 2014-06-04 DIAGNOSIS — G5600 Carpal tunnel syndrome, unspecified upper limb: Secondary | ICD-10-CM | POA: Diagnosis not present

## 2014-06-04 DIAGNOSIS — E785 Hyperlipidemia, unspecified: Secondary | ICD-10-CM | POA: Insufficient documentation

## 2014-06-04 DIAGNOSIS — Z7982 Long term (current) use of aspirin: Secondary | ICD-10-CM | POA: Insufficient documentation

## 2014-06-04 DIAGNOSIS — Z888 Allergy status to other drugs, medicaments and biological substances status: Secondary | ICD-10-CM | POA: Insufficient documentation

## 2014-06-04 DIAGNOSIS — I10 Essential (primary) hypertension: Secondary | ICD-10-CM | POA: Insufficient documentation

## 2014-06-04 DIAGNOSIS — Z885 Allergy status to narcotic agent status: Secondary | ICD-10-CM | POA: Insufficient documentation

## 2014-06-04 DIAGNOSIS — K219 Gastro-esophageal reflux disease without esophagitis: Secondary | ICD-10-CM | POA: Insufficient documentation

## 2014-06-04 DIAGNOSIS — E669 Obesity, unspecified: Secondary | ICD-10-CM | POA: Insufficient documentation

## 2014-06-04 HISTORY — PX: CARPAL TUNNEL RELEASE: SHX101

## 2014-06-04 SURGERY — CARPAL TUNNEL RELEASE
Anesthesia: Monitor Anesthesia Care | Site: Hand | Laterality: Right

## 2014-06-04 MED ORDER — DEXTROSE 5 % IV SOLN: 3.0000 g | INTRAVENOUS | Status: DC

## 2014-06-04 MED ORDER — FENTANYL CITRATE 0.05 MG/ML IJ SOLN
INTRAMUSCULAR | Status: AC
  Filled 2014-06-04: qty 2

## 2014-06-04 MED ORDER — BUPIVACAINE HCL (PF) 0.25 % IJ SOLN
INTRAMUSCULAR | Status: AC
  Filled 2014-06-04: qty 30

## 2014-06-04 MED ORDER — CIPROFLOXACIN IN D5W 400 MG/200ML IV SOLN
INTRAVENOUS | Status: AC
  Filled 2014-06-04: qty 200

## 2014-06-04 MED ORDER — CIPROFLOXACIN IN D5W 400 MG/200ML IV SOLN
400.0000 mg | Freq: Once | INTRAVENOUS | Status: AC
  Administered 2014-06-04: 400 mg via INTRAVENOUS

## 2014-06-04 MED ORDER — SODIUM CHLORIDE 0.45 % IV SOLN: INTRAVENOUS | Status: DC

## 2014-06-04 MED ORDER — OXYCODONE HCL 5 MG PO TABS
5.0000 mg | ORAL_TABLET | ORAL | Status: DC | PRN
Start: 2014-06-04 — End: 2014-06-19

## 2014-06-04 MED ORDER — FENTANYL CITRATE 0.05 MG/ML IJ SOLN
INTRAMUSCULAR | Status: DC | PRN
  Administered 2014-06-04: 50 ug via INTRAVENOUS

## 2014-06-04 MED ORDER — LIDOCAINE HCL (PF) 1 % IJ SOLN
INTRAMUSCULAR | Status: DC | PRN
  Administered 2014-06-04: 10 mL

## 2014-06-04 MED ORDER — CHLORHEXIDINE GLUCONATE 4 % EX LIQD: 60.0000 mL | Freq: Once | CUTANEOUS | Status: DC

## 2014-06-04 MED ORDER — TRAMADOL HCL 50 MG PO TABS: 100.0000 mg | ORAL_TABLET | Freq: Four times a day (QID) | ORAL | Status: DC | PRN

## 2014-06-04 MED ORDER — BUPIVACAINE HCL (PF) 0.25 % IJ SOLN
INTRAMUSCULAR | Status: DC | PRN
  Administered 2014-06-04: 10 mL

## 2014-06-04 MED ORDER — SULFAMETHOXAZOLE-TMP DS 800-160 MG PO TABS: 1.0000 | ORAL_TABLET | Freq: Two times a day (BID) | ORAL | Status: DC

## 2014-06-04 MED ORDER — PROPOFOL 10 MG/ML IV BOLUS
INTRAVENOUS | Status: AC
  Filled 2014-06-04: qty 60

## 2014-06-04 MED ORDER — SODIUM BICARBONATE 4 % IV SOLN
INTRAVENOUS | Status: DC | PRN
  Administered 2014-06-04: 2 mL via INTRAVENOUS

## 2014-06-04 MED ORDER — SODIUM BICARBONATE 4 % IV SOLN
INTRAVENOUS | Status: AC
  Filled 2014-06-04: qty 5

## 2014-06-04 MED ORDER — CIPROFLOXACIN IN D5W 400 MG/200ML IV SOLN: 400.0000 mg | Freq: Once | INTRAVENOUS | Status: DC

## 2014-06-04 MED ORDER — ONDANSETRON HCL 4 MG/2ML IJ SOLN
INTRAMUSCULAR | Status: DC | PRN
  Administered 2014-06-04: 4 mg via INTRAVENOUS

## 2014-06-04 MED ORDER — MIDAZOLAM HCL 2 MG/2ML IJ SOLN
INTRAMUSCULAR | Status: AC
  Filled 2014-06-04: qty 2

## 2014-06-04 MED ORDER — MIDAZOLAM HCL 2 MG/2ML IJ SOLN: 1.0000 mg | INTRAMUSCULAR | Status: DC | PRN

## 2014-06-04 MED ORDER — FENTANYL CITRATE 0.05 MG/ML IJ SOLN: 50.0000 ug | INTRAMUSCULAR | Status: DC | PRN

## 2014-06-04 MED ORDER — LIDOCAINE HCL (PF) 1 % IJ SOLN
INTRAMUSCULAR | Status: AC
  Filled 2014-06-04: qty 30

## 2014-06-04 MED ORDER — LACTATED RINGERS IV SOLN
INTRAVENOUS | Status: DC
  Administered 2014-06-04: 12:00:00 via INTRAVENOUS

## 2014-06-04 MED ORDER — MIDAZOLAM HCL 5 MG/5ML IJ SOLN
INTRAMUSCULAR | Status: DC | PRN
  Administered 2014-06-04: 1 mg via INTRAVENOUS

## 2014-06-04 SURGICAL SUPPLY — 49 items
BANDAGE ELASTIC 3 VELCRO ST LF (GAUZE/BANDAGES/DRESSINGS) ×2 IMPLANT
BLADE CARPAL TUNNEL SNGL USE (BLADE) ×2 IMPLANT
BLADE SURG 15 STRL LF DISP TIS (BLADE) ×2 IMPLANT
BLADE SURG 15 STRL SS (BLADE) ×4
BNDG CONFORM 3 STRL LF (GAUZE/BANDAGES/DRESSINGS) ×2 IMPLANT
BNDG GAUZE ELAST 4 BULKY (GAUZE/BANDAGES/DRESSINGS) ×1 IMPLANT
BRUSH SCRUB EZ PLAIN DRY (MISCELLANEOUS) ×2 IMPLANT
CORDS BIPOLAR (ELECTRODE) ×2 IMPLANT
COVER MAYO STAND STRL (DRAPES) ×2 IMPLANT
COVER TABLE BACK 60X90 (DRAPES) ×2 IMPLANT
CUFF TOURNIQUET SINGLE 24IN (TOURNIQUET CUFF) ×1 IMPLANT
DRAPE EXTREMITY T 121X128X90 (DRAPE) ×2 IMPLANT
DRAPE SURG 17X23 STRL (DRAPES) ×2 IMPLANT
DRSG EMULSION OIL 3X3 NADH (GAUZE/BANDAGES/DRESSINGS) ×2 IMPLANT
GAUZE SPONGE 4X4 12PLY STRL (GAUZE/BANDAGES/DRESSINGS) ×1 IMPLANT
GAUZE SPONGE 4X4 16PLY XRAY LF (GAUZE/BANDAGES/DRESSINGS) IMPLANT
GAUZE XEROFORM 1X8 LF (GAUZE/BANDAGES/DRESSINGS) ×2 IMPLANT
GLOVE BIOGEL M STRL SZ7.5 (GLOVE) ×2 IMPLANT
GLOVE BIOGEL PI IND STRL 7.0 (GLOVE) IMPLANT
GLOVE BIOGEL PI INDICATOR 7.0 (GLOVE) ×1
GLOVE SS BIOGEL STRL SZ 8 (GLOVE) ×1 IMPLANT
GLOVE SUPERSENSE BIOGEL SZ 8 (GLOVE) ×1
GLOVE SURG SS PI 7.0 STRL IVOR (GLOVE) ×1 IMPLANT
GOWN STRL REUS W/ TWL LRG LVL3 (GOWN DISPOSABLE) ×1 IMPLANT
GOWN STRL REUS W/ TWL XL LVL3 (GOWN DISPOSABLE) ×1 IMPLANT
GOWN STRL REUS W/TWL LRG LVL3 (GOWN DISPOSABLE) ×2
GOWN STRL REUS W/TWL XL LVL3 (GOWN DISPOSABLE) ×2
LOOP VESSEL MAXI BLUE (MISCELLANEOUS) IMPLANT
NDL HYPO 25X1 1.5 SAFETY (NEEDLE) ×2 IMPLANT
NDL SAFETY ECLIPSE 18X1.5 (NEEDLE) ×1 IMPLANT
NEEDLE HYPO 18GX1.5 SHARP (NEEDLE) ×2
NEEDLE HYPO 22GX1.5 SAFETY (NEEDLE) IMPLANT
NEEDLE HYPO 25X1 1.5 SAFETY (NEEDLE) ×4 IMPLANT
NS IRRIG 1000ML POUR BTL (IV SOLUTION) ×2 IMPLANT
PACK BASIN DAY SURGERY FS (CUSTOM PROCEDURE TRAY) ×2 IMPLANT
PAD ALCOHOL SWAB (MISCELLANEOUS) ×16 IMPLANT
PAD CAST 3X4 CTTN HI CHSV (CAST SUPPLIES) ×2 IMPLANT
PADDING CAST ABS 4INX4YD NS (CAST SUPPLIES) ×1
PADDING CAST ABS COTTON 4X4 ST (CAST SUPPLIES) ×1 IMPLANT
PADDING CAST COTTON 3X4 STRL (CAST SUPPLIES) ×2
SPONGE GAUZE 4X4 12PLY STER LF (GAUZE/BANDAGES/DRESSINGS) ×1 IMPLANT
STOCKINETTE 4X48 STRL (DRAPES) ×2 IMPLANT
SUT PROLENE 4 0 PS 2 18 (SUTURE) ×2 IMPLANT
SYR BULB 3OZ (MISCELLANEOUS) ×2 IMPLANT
SYR CONTROL 10ML LL (SYRINGE) ×4 IMPLANT
TOWEL OR 17X24 6PK STRL BLUE (TOWEL DISPOSABLE) ×2 IMPLANT
TOWEL OR NON WOVEN STRL DISP B (DISPOSABLE) ×2 IMPLANT
TRAY DSU PREP LF (CUSTOM PROCEDURE TRAY) ×2 IMPLANT
UNDERPAD 30X30 INCONTINENT (UNDERPADS AND DIAPERS) ×2 IMPLANT

## 2014-06-04 NOTE — Discharge Instructions (Signed)
Please elevate move and massage her fingers.  Please call for any problems.  Please take the Bactrim DS for your urinary tract infection and she'll be medicine is fully complete.  Keep bandage clean and dry.  Call for any problems.  No smoking.  Criteria for driving a car: you should be off your pain medicine for 7-8 hours, able to drive one handed(confident), thinking clearly and feeling able in your judgement to drive. Continue elevation as it will decrease swelling.  If instructed by MD move your fingers within the confines of the bandage/splint.  Use ice if instructed by your MD. Call immediately for any sudden loss of feeling in your hand/arm or change in functional abilities of the extremity.   We recommend that you to take vitamin C 1000 mg a day to promote healing we also recommend that if you require her pain medicine that he take a stool softener to prevent constipation as most pain medicines will have constipation side effects. We recommend either Peri-Colace or Senokot and recommend that you also consider adding MiraLAX to prevent the constipation affects from pain medicine if you are required to use them. These medicines are over the counter and maybe purchased at a local pharmacy.    Post Anesthesia Home Care Instructions  Activity: Get plenty of rest for the remainder of the day. A responsible adult should stay with you for 24 hours following the procedure.  For the next 24 hours, DO NOT: -Drive a car -Paediatric nurse -Drink alcoholic beverages -Take any medication unless instructed by your physician -Make any legal decisions or sign important papers.  Meals: Start with liquid foods such as gelatin or soup. Progress to regular foods as tolerated. Avoid greasy, spicy, heavy foods. If nausea and/or vomiting occur, drink only clear liquids until the nausea and/or vomiting subsides. Call your physician if vomiting continues.  Special Instructions/Symptoms: Your throat may  feel dry or sore from the anesthesia or the breathing tube placed in your throat during surgery. If this causes discomfort, gargle with warm salt water. The discomfort should disappear within 24 hours.

## 2014-06-04 NOTE — Progress Notes (Signed)
Called  Urine results to dr Amedeo Plenty. He called pt and pt doing good will still have surgery ordered cipro 400mg  pre op.

## 2014-06-04 NOTE — Op Note (Signed)
See dictation #[id number] Amedeo Plenty MD

## 2014-06-04 NOTE — Anesthesia Preprocedure Evaluation (Addendum)
Anesthesia Evaluation  Patient identified by MRN, date of birth, ID band Patient awake    Reviewed: Allergy & Precautions, H&P , NPO status , Patient's Chart, lab work & pertinent test results, reviewed documented beta blocker date and time   Airway Mallampati: II TM Distance: >3 FB Neck ROM: Full    Dental no notable dental hx. (+) Teeth Intact, Dental Advisory Given   Pulmonary neg pulmonary ROS,  breath sounds clear to auscultation  Pulmonary exam normal       Cardiovascular hypertension, Pt. on medications and Pt. on home beta blockers Rhythm:regular Rate:Normal  Palpitations. History atypical CP   Neuro/Psych negative neurological ROS  negative psych ROS   GI/Hepatic negative GI ROS, Neg liver ROS, hiatal hernia, GERD-  Medicated and Controlled,  Endo/Other  diabetes, Well Controlled, Type 2  Renal/GU negative Renal ROS  negative genitourinary   Musculoskeletal   Abdominal   Peds  Hematology negative hematology ROS (+)   Anesthesia Other Findings   Reproductive/Obstetrics negative OB ROS                        Anesthesia Physical Anesthesia Plan  ASA: III  Anesthesia Plan: Spinal   Post-op Pain Management:    Induction:   Airway Management Planned: Simple Face Mask  Additional Equipment:   Intra-op Plan:   Post-operative Plan:   Informed Consent: I have reviewed the patients History and Physical, chart, labs and discussed the procedure including the risks, benefits and alternatives for the proposed anesthesia with the patient or authorized representative who has indicated his/her understanding and acceptance.   Dental Advisory Given  Plan Discussed with: CRNA and Surgeon  Anesthesia Plan Comments:        Anesthesia Quick Evaluation                                  Anesthesia Evaluation  Patient identified by MRN, date of birth, ID band Patient awake  General  Assessment Comment:H/o difficulty waking up and breathing after 2 previous surgeries. Clearance Dr. Arnoldo Morale.  Reviewed: Allergy & Precautions, H&P , NPO status , Patient's Chart, lab work & pertinent test results  Airway Mallampati: II TM Distance: >3 FB Neck ROM: Full    Dental No notable dental hx.    Pulmonary pneumonia ,  breath sounds clear to auscultation  Pulmonary exam normal       Cardiovascular hypertension, Pt. on medications + dysrhythmias Rhythm:Regular Rate:Normal     Neuro/Psych  Neuromuscular disease negative psych ROS   GI/Hepatic Neg liver ROS, GERD-  Medicated,  Endo/Other  negative endocrine ROS  Renal/GU negative Renal ROS  negative genitourinary   Musculoskeletal negative musculoskeletal ROS (+)   Abdominal   Peds negative pediatric ROS (+)  Hematology negative hematology ROS (+)   Anesthesia Other Findings   Reproductive/Obstetrics negative OB ROS                           Anesthesia Physical Anesthesia Plan  ASA: III  Anesthesia Plan: Spinal   Post-op Pain Management:    Induction: Intravenous  Airway Management Planned:   Additional Equipment:   Intra-op Plan:   Post-operative Plan: Extubation in OR  Informed Consent: I have reviewed the patients History and Physical, chart, labs and discussed the procedure including the risks, benefits and alternatives for the proposed anesthesia with the patient  or authorized representative who has indicated his/her understanding and acceptance.   Dental advisory given  Plan Discussed with: CRNA  Anesthesia Plan Comments: (Discussed risks/benefits of spinal including headache, backache, failure, bleeding, infection, and nerve damage. Patient consents to spinal. Questions answered. Coagulation studies and platelet count acceptable. )       Anesthesia Quick Evaluation

## 2014-06-04 NOTE — H&P (Signed)
[name] is an 73 y.o. female.   Chief Complaint: R CTS HPI: patient presents for R CTR  .Marland KitchenPatient presents for evaluation and treatment of the of their upper extremity predicament. The patient denies neck back chest or of abdominal pain. The patient notes that they have no lower extremity problems. The patient from primarily complains of the upper extremity pain noted.   Past Medical History  Diagnosis Date  . Hyperlipidemia   . Hypertension   . Obesity   . Diverticulitis hx of  . Diverticulosis   . Abdominal pain   . Constipation   . Colon adenoma   . Complication of anesthesia 2006-at [facility]    breathing problems-no BP med given prior  . Arthritis   . GERD (gastroesophageal reflux disease)   . Pneumonia 2005  . Numbness and tingling in right hand     pt. states has numbness of right hand very frequently-watch positioning  . Personal history of colonic polyps-adenoma 08/26/2008    Past Surgical History  Procedure Laterality Date  . Skin draft for trauma    . Abdominal hysterectomy    . Bladder tack    . Colonoscopy [id number]    . Oophorectomy    . Ureter repair for tyransected left ureter    . Temporary ureter stent    . 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 Joint replacement    . Colon resection  2008    hx diverticulosis    Family History  Problem Relation Age of Onset  . Breast cancer Mother   . Prostate cancer Father   . Colon cancer Father 9   Social History:  reports that she has never smoked. She has never used smokeless tobacco. She reports that she does not drink alcohol or use illicit drugs.  Allergies:  Allergies  Allergen Reactions  . Lipitor [Atorvastatin Calcium] Itching  . Vicodin [Hydrocodone-Acetaminophen]     hallucinations    Medications Prior to Admission  Medication Sig Dispense Refill  .  amLODipine-benazepril (LOTREL) 5-20 MG per capsule TAKE ONE CAPSULE BY MOUTH EVERY DAY  30 capsule  11  . aspirin EC 81 MG tablet Take 81 mg by mouth daily.      Marland Kitchen atenolol-chlorthalidone (TENORETIC) 50-25 MG per tablet TAKE ONE TABLET BY MOUTH EVERY DAY  30 tablet  11  . famotidine (PEPCID) 20 MG tablet Take 20 mg by mouth daily.      . rosuvastatin (CRESTOR) 20 MG tablet Take 20 mg by mouth at bedtime. 3 times a week      . Cyanocobalamin (B-12 PO) Take by mouth daily.        Results for orders placed during the hospital encounter of 06/04/14 (from the past 48 hour(s))  URINALYSIS, ROUTINE W REFLEX MICROSCOPIC     Status: Abnormal   Collection Time    06/03/14 12:00 PM      Result Value Ref Range   Color, Urine YELLOW  YELLOW   APPearance CLEAR  CLEAR   Specific Gravity, Urine 1.018  1.005 - 1.030   pH 6.0  5.0 - 8.0   Glucose, UA NEGATIVE  NEGATIVE mg/dL   Hgb urine dipstick NEGATIVE  NEGATIVE   Bilirubin Urine NEGATIVE  NEGATIVE   Ketones, ur NEGATIVE  NEGATIVE mg/dL   Protein, ur NEGATIVE  NEGATIVE mg/dL   Urobilinogen, UA 1.0  0.0 - 1.0 mg/dL   Nitrite POSITIVE (*) NEGATIVE   Leukocytes, UA MODERATE (*) NEGATIVE  URINE MICROSCOPIC-ADD ON     Status: Abnormal   Collection Time    06/03/14 12:00 PM      Result Value Ref Range   Squamous Epithelial / LPF RARE  RARE   WBC, UA 11-20  <3 WBC/hpf   RBC / HPF 0-2  <3 RBC/hpf   Bacteria, UA MANY (*) RARE  BASIC METABOLIC PANEL     Status: Abnormal   Collection Time    06/03/14 12:18 PM      Result Value Ref Range   Sodium 143  137 - 147 mEq/L   Potassium 4.0  3.7 - 5.3 mEq/L   Chloride 105  96 - 112 mEq/L   CO2 24  19 - 32 mEq/L   Glucose, Bld 138 (*) 70 - 99 mg/dL   BUN 22  6 - 23 mg/dL   Creatinine, Ser 1.05  0.50 - 1.10 mg/dL   Calcium 9.1  8.4 - 10.5 mg/dL   GFR calc non Af Amer 52 (*) >90 mL/min   GFR calc Af Amer 60 (*) >90 mL/min   Comment: (NOTE)     The eGFR has been calculated using the CKD EPI equation.      This calculation has not been validated in all clinical situations.     eGFR's persistently <90 mL/min signify possible Chronic Kidney     Disease.   Anion gap 14  5 - 15   No results found.  Review of Systems  Constitutional: Negative.   HENT: Negative.   Eyes: Negative.   Respiratory: Negative.   Genitourinary: Negative.   Neurological: Negative.   Endo/Heme/Allergies: Negative.     Blood pressure 139/79, pulse 59, temperature 97.7 F (36.5 C), temperature source Oral, resp. rate 18, height 5' 8.5" (1.74 m), weight 127.659 kg (281 lb 7 oz), SpO2 96.00%. Physical Exam presents for R CTR The patient is alert and oriented in no acute distress the patient complains of pain in the affected upper extremity.  The patient is noted to have a normal HEENT exam.  Lung fields show equal chest expansion and no shortness of breath  abdomen exam is nontender without distention.  Lower extremity examination does not show any fracture dislocation or blood clot symptoms.  Pelvis is stable neck and back are stable and nontender  Assessment/Plan We are planning surgery for your upper extremity. The risk and benefits of surgery include risk of bleeding infection anesthesia damage to normal structures and failure of the surgery to accomplish its intended goals of relieving symptoms and restoring function with this in mind we'll going to proceed. I have specifically discussed with the patient the pre-and postoperative regime and the does and don'ts and risk and benefits in great detail. Risk and benefits of surgery also include risk of dystrophy chronic nerve pain failure of the healing process to go onto completion and other inherent risks of surgery The relavent the pathophysiology of the disease/injury process, as well as the alternatives for treatment and postoperative course of action has been discussed in great detail with the patient who desires to proceed.  We will do everything in our power to  help you (the patient) restore function to the upper extremity. Is a pleasure to see this patient today.   Mykalah Saari III,Aarian Griffie M 06/04/2014, 1:28 PM

## 2014-06-04 NOTE — Transfer of Care (Signed)
Immediate Anesthesia Transfer of Care Note  Patient: [name]  Procedure(s) Performed: Procedure(s): RIGHT CARPAL TUNNEL RELEASE (Right)  Patient Location: PACU  Anesthesia Type:MAC  Level of Consciousness: sedated and patient cooperative  Airway & Oxygen Therapy: Patient Spontanous Breathing and Patient connected to face mask oxygen  Post-op Assessment: Report given to PACU RN and Post -op Vital signs reviewed and stable  Post vital signs: Reviewed and stable  Complications: No apparent anesthesia complications

## 2014-06-04 NOTE — Anesthesia Postprocedure Evaluation (Signed)
Anesthesia Post Note  Patient: [name]  Procedure(s) Performed: Procedure(s) (LRB): RIGHT CARPAL TUNNEL RELEASE (Right)  Anesthesia type: general  Patient location: PACU  Post pain: Pain level controlled  Post assessment: Patient's Cardiovascular Status Stable  Last Vitals:  Filed Vitals:   06/04/14 1510  BP: 159/67  Pulse: 57  Temp: 36.4 C  Resp: 16    Post vital signs: Reviewed and stable  Level of consciousness: sedated  Complications: No apparent anesthesia complications

## 2014-06-04 NOTE — Anesthesia Preprocedure Evaluation (Signed)
Anesthesia Evaluation  Patient identified by MRN, date of birth, ID band Patient awake    Reviewed: Allergy & Precautions, H&P , NPO status , Patient's Chart, lab work & pertinent test results  Airway Mallampati: I TM Distance: >3 FB Neck ROM: Full    Dental   Pulmonary          Cardiovascular hypertension, Pt. on medications     Neuro/Psych    GI/Hepatic GERD-  Medicated and Controlled,  Endo/Other    Renal/GU      Musculoskeletal   Abdominal   Peds  Hematology   Anesthesia Other Findings   Reproductive/Obstetrics                           Anesthesia Physical Anesthesia Plan  ASA: II  Anesthesia Plan: General   Post-op Pain Management:    Induction: Intravenous  Airway Management Planned: Natural Airway  Additional Equipment:   Intra-op Plan:   Post-operative Plan:   Informed Consent: I have reviewed the patients History and Physical, chart, labs and discussed the procedure including the risks, benefits and alternatives for the proposed anesthesia with the patient or authorized representative who has indicated his/her understanding and acceptance.     Plan Discussed with: CRNA and Surgeon  Anesthesia Plan Comments:         Anesthesia Quick Evaluation

## 2014-06-05 ENCOUNTER — Encounter (HOSPITAL_BASED_OUTPATIENT_CLINIC_OR_DEPARTMENT_OTHER): Payer: Self-pay | Admitting: Orthopedic Surgery

## 2014-06-05 NOTE — Op Note (Signed)
NAMECANDUS, BRAUD NO.:  [id number]  MEDICAL RECORD NO.:  [id number]  LOCATION:                                 FACILITY:  PHYSICIAN:  Satira Anis. Sarahgrace Broman, M.D.DATE OF BIRTH:  Jul 19, 1941  DATE OF PROCEDURE:  06/04/2014 DATE OF DISCHARGE:  06/04/2014                              OPERATIVE REPORT   PREOPERATIVE DIAGNOSIS:  Right carpal tunnel syndrome.  POSTOPERATIVE DIAGNOSIS:  Right carpal tunnel syndrome.  PROCEDURE:  Right limited open carpal tunnel release.  SURGEON:  Satira Anis. Amedeo Plenty, M.D.  ASSISTANT:  None.  COMPLICATIONS:  None.  ANESTHESIA:  Propofol nerve/median nerve block was performed by myself without general anesthetic.  The patient __________ fentanyl and versed (1 and 1) and was kept awake, alert, and oriented during the entire case.  TOURNIQUET TIME:  Less than 10 minutes.  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  I have discussed the risks, benefits, timeframe duration of recovery, do's and don'ts, pre and postop retains, risks of infection, bleeding, anesthesia, damage to normal structures, and failure of surgery to accomplish its intended goals of relieving symptoms and restoring function were discussed her at length.  With this in mind, she desires to proceed.  All questions have been encouraged and answered preoperatively.  OPERATIVE PROCEDURE:  The patient was seen by myself and Anesthesia, time-out called.  Pre and postop check was complete.  She underwent a field/median nerve block with 19 mL of a mixture of lidocaine and Sensorcaine without epinephrine.  Following this, prepped and draped in the usual sterile fashion.  Once this done, arm was elevated. Tourniquet was insufflated to 250 mmHg.  An 1 cm incision was made __________ transverse carpal ligament.  Dissection was carried down. Palmar fascia released __________ then  released under 4.0 loupe magnification.  Fat pad egressed nicely.  Distal to proximal dissection was carried out __________ adequate room was available for canal __________ just over the proximal leading leaflet of a transverse carpal ligament.  Following this, __________ was placed after __________ releasing the proximal leaflet of the transcarpal ligament.  Following this, the median nerve was evaluated.  It was hyperemic, intact, flattened and there was very impressive canal thickness about the transverse carpal region.  The patient had irrigation applied followed by obtained hemostasis with bipolar electrocautery about bleeding points followed by closure of the wound.  Once hemostasis was complete, knee irrigation was applied to the wound.  Sterile dressing was applied.  __________ back in the office in 7 days therapy in 12 days.  Notify should any problems occur. Standard postop __________ will be adhered to and should any problems arise she will notify us.  __________ participate in her care.     Satira Anis. Amedeo Plenty, M.D.     [facility]  D:  06/04/2014  T:  06/04/2014  Job:  [id number]

## 2014-06-09 ENCOUNTER — Ambulatory Visit (INDEPENDENT_AMBULATORY_CARE_PROVIDER_SITE_OTHER): Payer: Medicare Other | Admitting: Family

## 2014-06-09 ENCOUNTER — Encounter: Payer: Self-pay | Admitting: Family

## 2014-06-09 ENCOUNTER — Encounter: Payer: Medicare Other | Admitting: Family

## 2014-06-09 VITALS — BP 124/72 | HR 67 | Temp 98.5°F | Ht 67.0 in | Wt 285.0 lb

## 2014-06-09 DIAGNOSIS — R7309 Other abnormal glucose: Secondary | ICD-10-CM

## 2014-06-09 DIAGNOSIS — E7800 Pure hypercholesterolemia, unspecified: Secondary | ICD-10-CM

## 2014-06-09 DIAGNOSIS — R0989 Other specified symptoms and signs involving the circulatory and respiratory systems: Secondary | ICD-10-CM

## 2014-06-09 DIAGNOSIS — Z0000 Encounter for general adult medical examination without abnormal findings: Secondary | ICD-10-CM

## 2014-06-09 DIAGNOSIS — R739 Hyperglycemia, unspecified: Secondary | ICD-10-CM

## 2014-06-09 DIAGNOSIS — I10 Essential (primary) hypertension: Secondary | ICD-10-CM

## 2014-06-09 LAB — POCT URINALYSIS DIPSTICK
Bilirubin, UA: NEGATIVE
Blood, UA: NEGATIVE
Glucose, UA: NEGATIVE
Ketones, UA: NEGATIVE
Leukocytes, UA: NEGATIVE
Nitrite, UA: NEGATIVE
Protein, UA: NEGATIVE
Spec Grav, UA: 1.015
Urobilinogen, UA: 0.2
pH, UA: 5.5

## 2014-06-09 LAB — BASIC METABOLIC PANEL
BUN: 28 mg/dL — ABNORMAL HIGH (ref 6–23)
CO2: 20 mEq/L (ref 19–32)
Calcium: 9.2 mg/dL (ref 8.4–10.5)
Chloride: 102 mEq/L (ref 96–112)
Creatinine, Ser: 1.6 mg/dL — ABNORMAL HIGH (ref 0.4–1.2)
GFR: 41.03 mL/min — ABNORMAL LOW (ref >60.00)
Glucose, Bld: 137 mg/dL — ABNORMAL HIGH (ref 70–99)
Potassium: 4.4 mEq/L (ref 3.5–5.1)
Sodium: 136 mEq/L (ref 135–145)

## 2014-06-09 LAB — CBC WITH DIFFERENTIAL/PLATELET
Basophils Absolute: 0 10*3/uL (ref 0.0–0.1)
Basophils Relative: 0.5 % (ref 0.0–3.0)
Eosinophils Absolute: 0.4 10*3/uL (ref 0.0–0.7)
Eosinophils Relative: 4.5 % (ref 0.0–5.0)
HCT: 44 % (ref 36.0–46.0)
Hemoglobin: 14.9 g/dL (ref 12.0–15.0)
Lymphocytes Relative: 31.9 % (ref 12.0–46.0)
Lymphs Abs: 3.1 10*3/uL (ref 0.7–4.0)
MCHC: 33.7 g/dL (ref 30.0–36.0)
MCV: 89.1 fl (ref 78.0–100.0)
Monocytes Absolute: 0.7 10*3/uL (ref 0.1–1.0)
Monocytes Relative: 7.1 % (ref 3.0–12.0)
Neutro Abs: 5.4 10*3/uL (ref 1.4–7.7)
Neutrophils Relative %: 56 % (ref 43.0–77.0)
Platelets: 250 10*3/uL (ref 150.0–400.0)
RBC: 4.94 Mil/uL (ref 3.87–5.11)
RDW: 13.3 % (ref 11.5–15.5)
WBC: 9.7 10*3/uL (ref 4.0–10.5)

## 2014-06-09 LAB — HEPATIC FUNCTION PANEL
ALT: 30 U/L (ref 0–35)
AST: 36 U/L (ref 0–37)
Albumin: 3.9 g/dL (ref 3.5–5.2)
Alkaline Phosphatase: 44 U/L (ref 39–117)
Bilirubin, Direct: 0.1 mg/dL (ref 0.0–0.3)
Total Bilirubin: 1 mg/dL (ref 0.2–1.2)
Total Protein: 7.3 g/dL (ref 6.0–8.3)

## 2014-06-09 LAB — LIPID PANEL
Cholesterol: 234 mg/dL — ABNORMAL HIGH (ref 0–200)
HDL: 34.7 mg/dL — ABNORMAL LOW (ref >39.00)
LDL Cholesterol: 169 mg/dL — ABNORMAL HIGH (ref 0–99)
NonHDL: 199.3
Total CHOL/HDL Ratio: 7
Triglycerides: 153 mg/dL — ABNORMAL HIGH (ref 0.0–149.0)
VLDL: 30.6 mg/dL (ref 0.0–40.0)

## 2014-06-09 LAB — HEMOGLOBIN A1C: Hgb A1c MFr Bld: 7.6 % — ABNORMAL HIGH (ref 4.6–6.5)

## 2014-06-09 NOTE — Patient Instructions (Signed)
Breast Self-Awareness  Breast self-awareness allows you to notice a breast problem early while it is still small. Do a breast self-exam:  · Every month, 5-7 days after your period (menstrual period).  · At the same time each month if you do not have periods anymore.  Look for any:  · Difference between your breasts (size, shape, or position).  · Change in breast shape or size.  · Fluid or blood coming from your nipples.  · Changes in your nipples (dimpling, nipple movement).  ·  Change in skin color or texture (redness, scaly areas).  Feel for:  · Lumps.  · Bumps.  · Dips.  · Any other changes.  HOW TO DO A BREAST SELF-EXAM  Look at your breasts and nipples.  1. Take off all your clothes above your waist.  2. Stand in front of a mirror in a room with good lighting.  3. Put your hands on your hips and push your hands downward.  Feel your breasts.   1. Lie flat on your back or stand in the shower or tub. If you are in the shower or tub, have wet, soapy hands.  2. Place your right arm above your head.  3. Place your left hand in the right underarm area.  4. Make small circles using the pads (not the fingertips) of your 3 middle fingers. Press lightly and then with medium and firm pressure.  5. Move your fingers a little lower and make the small circles at the 3 pressures (light, medium, and firm).  6. Continue moving your fingers lower and making circles until you reach the bottom of your breast.  7. Move your fingers one finger-width towards the center of the body.  8. Continue making the circles, this time moving upward until you reach the bottom of your neck.  9. Move your fingers one finger-width towards the center of your body.  10. Make circles downward when starting at the bottom of the neck. Make circles upward when starting at the bottom of the breast. Stop when you reach the middle of the chest.  11.  Repeat these steps on the other breast.  Write down what looks and feels normal for each breast. Also write  down any changes you notice.  GET HELP RIGHT AWAY IF:  · You see any changes in your breasts or nipples.  · You see skin changes.  · You have unusual discharge from your nipples.  · You feel a new lump.  · You feel unusually thick areas.  Document Released: 04/03/2008 Document Revised: 10/02/2012 Document Reviewed: 01/31/2012  Exit[employer]® Patient Information ©2015 [employer]. This information is not intended to replace advice given to you by your health care provider. Make sure you discuss any questions you have with your health care provider.

## 2014-06-09 NOTE — Progress Notes (Signed)
Subjective:    Patient ID: [name], female    DOB: [birth year], 73 y.o.   MRN: [id number]  HPI 73 year old African American female, with a history of hypertension, morbid obesity, hyperlipidemia, osteoarthritis, hyperglycemia, and possible carotid bruits in today for complete physical exam. Plan to have total knee replacements of the right knee 08/17/2014.   This is a routine wellness  examination for this patient . I reviewed all health maintenance protocols including mammography, colonoscopy, bone density Needed referrals were placed. Age and diagnosis  appropriate screening labs were ordered. Her immunization history was reviewed and appropriate vaccinations were ordered. Her current medications and allergies were reviewed and needed refills of her chronic medications were ordered. The plan for yearly health maintenance was discussed all orders and referrals were made as appropriate.   Review of Systems  Constitutional: Negative.   HENT: Negative.   Eyes: Negative.   Respiratory: Negative.   Cardiovascular: Negative.   Gastrointestinal: Negative.   Endocrine: Negative.   Genitourinary: Negative.  Negative for dysuria.  Musculoskeletal: Positive for arthralgias.       Right knee pain   Skin: Negative.   Allergic/Immunologic: Negative.   Neurological: Negative.   Hematological: Negative.   Psychiatric/Behavioral: Negative.   All other systems reviewed and are negative.  Past Medical History  Diagnosis Date  . Hyperlipidemia   . Hypertension   . Obesity   . Diverticulitis hx of  . Diverticulosis   . Abdominal pain   . Constipation   . Colon adenoma   . Complication of anesthesia 2006-at [facility]    breathing problems-no BP med given prior  . Arthritis   . GERD (gastroesophageal reflux disease)   . Pneumonia 2005  . Numbness and tingling in right hand     pt. states has numbness of right hand very frequently-watch positioning  . Personal history of colonic  polyps-adenoma 08/26/2008    History   Social History  . Marital Status: Married    Spouse Name: N/A    Number of Children: N/A  . Years of Education: N/A   Occupational History  . Not on file.   Social History Main Topics  . Smoking status: Never Smoker   . Smokeless tobacco: Never Used  . Alcohol Use: No  . Drug Use: No  . Sexual Activity: Yes   Other Topics Concern  . Not on file   Social History Narrative  . No narrative on file    Past Surgical History  Procedure Laterality Date  . Skin draft for trauma    . Abdominal hysterectomy    . Bladder tack    . Colonoscopy [id number]    . Oophorectomy    . Ureter repair for tyransected left ureter    . Temporary ureter stent    . 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 Joint replacement    . Colon resection  2008    hx diverticulosis  . Carpal tunnel release Right 06/04/2014    Procedure: RIGHT CARPAL TUNNEL RELEASE;  Surgeon: Roseanne Kaufman, MD;  Location: [facility];  Service: Orthopedics;  Laterality: Right;    Family History  Problem Relation Age of Onset  . Breast cancer Mother   . Prostate cancer Father   . Colon cancer Father 65    Allergies  Allergen Reactions  . Lipitor [Atorvastatin Calcium] Itching  .  Vicodin [Hydrocodone-Acetaminophen]     hallucinations    Current Outpatient Prescriptions on File Prior to Visit  Medication Sig Dispense Refill  . amLODipine-benazepril (LOTREL) 5-20 MG per capsule TAKE ONE CAPSULE BY MOUTH EVERY DAY  30 capsule  11  . aspirin EC 81 MG tablet Take 81 mg by mouth daily.      Marland Kitchen atenolol-chlorthalidone (TENORETIC) 50-25 MG per tablet TAKE ONE TABLET BY MOUTH EVERY DAY  30 tablet  11  . Cyanocobalamin (B-12 PO) Take by mouth daily.      . famotidine (PEPCID) 20 MG tablet Take 20 mg by mouth daily.      Marland Kitchen oxyCODONE (OXY  IR/ROXICODONE) 5 MG immediate release tablet Take 1 tablet (5 mg total) by mouth every 4 (four) hours as needed for severe pain.  30 tablet  0  . rosuvastatin (CRESTOR) 20 MG tablet Take 20 mg by mouth at bedtime. 3 times a week      . sulfamethoxazole-trimethoprim (BACTRIM DS) 800-160 MG per tablet Take 1 tablet by mouth 2 (two) times daily.  20 tablet  0  . traMADol (ULTRAM) 50 MG tablet Take 2 tablets (100 mg total) by mouth every 6 (six) hours as needed.  30 tablet  0   No current facility-administered medications on file prior to visit.    BP 124/72  Pulse 67  Temp(Src) 98.5 F (36.9 C) (Oral)  Ht 5\' 7"  (1.702 m)  Wt 285 lb (129.275 kg)  BMI 44.63 kg/m2  SpO2 96%chart     Objective:   Physical Exam  Constitutional: She is oriented to person, place, and time. She appears well-developed and well-nourished.  HENT:  Head: Normocephalic and atraumatic.  Right Ear: External ear normal.  Left Ear: External ear normal.  Nose: Nose normal.  Mouth/Throat: Oropharynx is clear and moist.  Eyes: Conjunctivae and EOM are normal. Pupils are equal, round, and reactive to light.  Neck: Normal range of motion. Neck supple.  Cardiovascular: Normal rate, regular rhythm and normal heart sounds.   Pulmonary/Chest: Effort normal and breath sounds normal.  Abdominal: Soft. Bowel sounds are normal.  Genitourinary:  Deferred to GYN  Musculoskeletal: Normal range of motion. She exhibits no edema and no tenderness.  Neurological: She is alert and oriented to person, place, and time. She has normal reflexes. She displays normal reflexes. No cranial nerve deficit. Coordination normal.  Skin: Skin is warm and dry.  Psychiatric: She has a normal mood and affect.          Assessment & Plan:  [name] was seen today for annual exam.  Diagnoses and associated orders for this visit:  Preventative health care - Basic Metabolic Panel - Hemoglobin A1c - Lipid Panel - CBC with Differential - POC  Urinalysis Dipstick - Hepatic Function Panel  Unspecified essential hypertension - Basic Metabolic Panel - Hemoglobin A1c - Lipid Panel - CBC with Differential - POC Urinalysis Dipstick - Hepatic Function Panel  Pure hypercholesterolemia - Basic Metabolic Panel - Hemoglobin A1c - Lipid Panel - CBC with Differential - POC Urinalysis Dipstick - Hepatic Function Panel  Hyperglycemia - Basic Metabolic Panel - Hemoglobin A1c - Lipid Panel - CBC with Differential - POC Urinalysis Dipstick - Hepatic Function Panel  Carotid bruit, unspecified laterality - Carotid duplex; Future    call the office with any questions or concerns. Recheck in 6 months and sooner as needed. Encouraged healthy diet, exercise, weight reduction.

## 2014-06-09 NOTE — Progress Notes (Signed)
Pre visit review using our clinic review tool, if applicable. No additional management support is needed unless otherwise documented below in the visit note. 

## 2014-06-11 ENCOUNTER — Encounter (HOSPITAL_COMMUNITY): Payer: Medicare Other

## 2014-06-13 ENCOUNTER — Emergency Department (HOSPITAL_COMMUNITY)
Admission: EM | Admit: 2014-06-13 | Discharge: 2014-06-14 | Disposition: A | Discharge status: Home / Self Care | Payer: Medicare Other | Attending: Emergency Medicine | Admitting: Emergency Medicine

## 2014-06-13 DIAGNOSIS — M129 Arthropathy, unspecified: Secondary | ICD-10-CM | POA: Insufficient documentation

## 2014-06-13 DIAGNOSIS — K219 Gastro-esophageal reflux disease without esophagitis: Secondary | ICD-10-CM | POA: Insufficient documentation

## 2014-06-13 DIAGNOSIS — E669 Obesity, unspecified: Secondary | ICD-10-CM | POA: Diagnosis not present

## 2014-06-13 DIAGNOSIS — Z860100 Personal history of colon polyps, unspecified: Secondary | ICD-10-CM | POA: Insufficient documentation

## 2014-06-13 DIAGNOSIS — I10 Essential (primary) hypertension: Secondary | ICD-10-CM | POA: Diagnosis not present

## 2014-06-13 DIAGNOSIS — Z90710 Acquired absence of both cervix and uterus: Secondary | ICD-10-CM | POA: Insufficient documentation

## 2014-06-13 DIAGNOSIS — Z8701 Personal history of pneumonia (recurrent): Secondary | ICD-10-CM | POA: Diagnosis not present

## 2014-06-13 DIAGNOSIS — N289 Disorder of kidney and ureter, unspecified: Secondary | ICD-10-CM | POA: Insufficient documentation

## 2014-06-13 DIAGNOSIS — Z79899 Other long term (current) drug therapy: Secondary | ICD-10-CM | POA: Diagnosis not present

## 2014-06-13 DIAGNOSIS — E785 Hyperlipidemia, unspecified: Secondary | ICD-10-CM | POA: Insufficient documentation

## 2014-06-13 DIAGNOSIS — R51 Headache: Secondary | ICD-10-CM | POA: Insufficient documentation

## 2014-06-13 DIAGNOSIS — Z7982 Long term (current) use of aspirin: Secondary | ICD-10-CM | POA: Insufficient documentation

## 2014-06-13 DIAGNOSIS — M6281 Muscle weakness (generalized): Secondary | ICD-10-CM | POA: Insufficient documentation

## 2014-06-13 DIAGNOSIS — R531 Weakness: Secondary | ICD-10-CM

## 2014-06-14 ENCOUNTER — Encounter (HOSPITAL_COMMUNITY): Payer: Self-pay | Admitting: Emergency Medicine

## 2014-06-14 LAB — CBC WITH DIFFERENTIAL/PLATELET
Basophils Absolute: 0.1 10*3/uL (ref 0.0–0.1)
Basophils Relative: 1 % (ref 0–1)
Eosinophils Absolute: 0.5 10*3/uL (ref 0.0–0.7)
Eosinophils Relative: 5 % (ref 0–5)
HCT: 41.8 % (ref 36.0–46.0)
Hemoglobin: 14.3 g/dL (ref 12.0–15.0)
Lymphocytes Relative: 39 % (ref 12–46)
Lymphs Abs: 4.1 10*3/uL — ABNORMAL HIGH (ref 0.7–4.0)
MCH: 29.7 pg (ref 26.0–34.0)
MCHC: 34.2 g/dL (ref 30.0–36.0)
MCV: 86.9 fL (ref 78.0–100.0)
Monocytes Absolute: 0.6 10*3/uL (ref 0.1–1.0)
Monocytes Relative: 6 % (ref 3–12)
Neutro Abs: 5.1 10*3/uL (ref 1.7–7.7)
Neutrophils Relative %: 49 % (ref 43–77)
Platelets: 248 10*3/uL (ref 150–400)
RBC: 4.81 MIL/uL (ref 3.87–5.11)
RDW: 12.9 % (ref 11.5–15.5)
WBC: 10.4 10*3/uL (ref 4.0–10.5)

## 2014-06-14 LAB — COMPREHENSIVE METABOLIC PANEL
ALT: 35 U/L (ref 0–35)
AST: 46 U/L — ABNORMAL HIGH (ref 0–37)
Albumin: 3.7 g/dL (ref 3.5–5.2)
Alkaline Phosphatase: 49 U/L (ref 39–117)
Anion gap: 17 — ABNORMAL HIGH (ref 5–15)
BUN: 39 mg/dL — ABNORMAL HIGH (ref 6–23)
CO2: 19 mEq/L (ref 19–32)
Calcium: 9.5 mg/dL (ref 8.4–10.5)
Chloride: 98 mEq/L (ref 96–112)
Creatinine, Ser: 2.22 mg/dL — ABNORMAL HIGH (ref 0.50–1.10)
GFR calc Af Amer: 24 mL/min — ABNORMAL LOW (ref >90)
GFR calc non Af Amer: 21 mL/min — ABNORMAL LOW (ref >90)
Glucose, Bld: 119 mg/dL — ABNORMAL HIGH (ref 70–99)
Potassium: 4.8 mEq/L (ref 3.7–5.3)
Sodium: 134 mEq/L — ABNORMAL LOW (ref 137–147)
Total Bilirubin: 0.5 mg/dL (ref 0.3–1.2)
Total Protein: 7.6 g/dL (ref 6.0–8.3)

## 2014-06-14 LAB — URINALYSIS, ROUTINE W REFLEX MICROSCOPIC
Bilirubin Urine: NEGATIVE
Glucose, UA: NEGATIVE mg/dL
Hgb urine dipstick: NEGATIVE
Ketones, ur: NEGATIVE mg/dL
Nitrite: NEGATIVE
Protein, ur: NEGATIVE mg/dL
Specific Gravity, Urine: 1.014 (ref 1.005–1.030)
Urobilinogen, UA: 1 mg/dL (ref 0.0–1.0)
pH: 5.5 (ref 5.0–8.0)

## 2014-06-14 LAB — URINE MICROSCOPIC-ADD ON

## 2014-06-14 LAB — LIPASE, BLOOD: Lipase: 35 U/L (ref 11–59)

## 2014-06-14 MED ORDER — SODIUM CHLORIDE 0.9 % IV BOLUS (SEPSIS)
1000.0000 mL | Freq: Once | INTRAVENOUS | Status: AC
  Administered 2014-06-14: 1000 mL via INTRAVENOUS

## 2014-06-14 NOTE — Discharge Instructions (Signed)
Stop taking bactrim.  Increase your fluid intake.  Follow up with your doctor in 2-3 days for recheck of your renal function.  Return to the ER for worsening condition or new concerning symptoms.   Weakness Weakness is a lack of strength. It may be felt all over the body (generalized) or in one specific part of the body (focal). Some causes of weakness can be serious. You may need further medical evaluation, especially if you are elderly or you have a history of immunosuppression (such as chemotherapy or HIV), kidney disease, heart disease, or diabetes. CAUSES  Weakness can be caused by many different things, including:  Infection.  Physical exhaustion.  Internal bleeding or other blood loss that results in a lack of red blood cells (anemia).  Dehydration. This cause is more common in elderly people.  Side effects or electrolyte abnormalities from medicines, such as pain medicines or sedatives.  Emotional distress, anxiety, or depression.  Circulation problems, especially severe peripheral arterial disease.  Heart disease, such as rapid atrial fibrillation, bradycardia, or heart failure.  Nervous system disorders, such as Guillain-Barr syndrome, multiple sclerosis, or stroke. DIAGNOSIS  To find the cause of your weakness, your caregiver will take your history and perform a physical exam. Lab tests or X-rays may also be ordered, if needed. TREATMENT  Treatment of weakness depends on the cause of your symptoms and can vary greatly. HOME CARE INSTRUCTIONS   Rest as needed.  Eat a well-balanced diet.  Try to get some exercise every day.  Only take over-the-counter or prescription medicines as directed by your caregiver. SEEK MEDICAL CARE IF:   Your weakness seems to be getting worse or spreads to other parts of your body.  You develop new aches or pains. SEEK IMMEDIATE MEDICAL CARE IF:   You cannot perform your normal daily activities, such as getting dressed and feeding  yourself.  You cannot walk up and down stairs, or you feel exhausted when you do so.  You have shortness of breath or chest pain.  You have difficulty moving parts of your body.  You have weakness in only one area of the body or on only one side of the body.  You have a fever.  You have trouble speaking or swallowing.  You cannot control your bladder or bowel movements.  You have black or bloody vomit or stools. MAKE SURE YOU:  Understand these instructions.  Will watch your condition.  Will get help right away if you are not doing well or get worse. Document Released: 10/16/2005 Document Revised: 04/16/2012 Document Reviewed: 12/15/2011 [facility] Patient Information 2015 [place], [place]. This information is not intended to replace advice given to you by your health care provider. Make sure you discuss any questions you have with your health care provider.

## 2014-06-14 NOTE — ED Provider Notes (Signed)
CSN: [id number]     Arrival date & time 06/13/14  2336 History   First MD Initiated Contact with Patient 06/14/14 0024     Chief Complaint  Patient presents with  . Headache  . Weakness     (Consider location/radiation/quality/duration/timing/severity/associated sxs/prior Treatment) HPI 73 year old presents to emergency from home with complaint of one week of generalized weakness, dizziness and head pressure.  She reports that she has not been eating or drinking well.  Patient had carpal tunnel surgery one week ago on Friday.  She was noted that time to have a urinary tract infection.  She was started on Bactrim.  Patient has not taken Bactrim in the past, and thinks that maybe the medicine is making her feel bad.  She denies any fever chills Past Medical History  Diagnosis Date  . Hyperlipidemia   . Hypertension   . Obesity   . Diverticulitis hx of  . Diverticulosis   . Abdominal pain   . Constipation   . Colon adenoma   . Complication of anesthesia 2006-at [facility]    breathing problems-no BP med given prior  . Arthritis   . GERD (gastroesophageal reflux disease)   . Pneumonia 2005  . Numbness and tingling in right hand     pt. states has numbness of right hand very frequently-watch positioning  . Personal history of colonic polyps-adenoma 08/26/2008   Past Surgical History  Procedure Laterality Date  . Skin draft for trauma    . Abdominal hysterectomy    . Bladder tack    . Colonoscopy [id number]    . Oophorectomy    . Ureter repair for tyransected left ureter    . Temporary ureter stent    . 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 Joint replacement    . Colon resection  2008    hx diverticulosis  . Carpal tunnel release Right 06/04/2014    Procedure: RIGHT CARPAL TUNNEL RELEASE;  Surgeon: Roseanne Kaufman, MD;  Location: [facility];  Service: Orthopedics;  Laterality: Right;   Family History  Problem Relation Age of Onset  . Breast cancer Mother   . Prostate cancer Father   . Colon cancer Father 29   History  Substance Use Topics  . Smoking status: Never Smoker   . Smokeless tobacco: Never Used  . Alcohol Use: No   OB History   Grav Para Term Preterm Abortions TAB SAB Ect Mult Living                 Review of Systems  All other systems reviewed and are negative.     Allergies  Lipitor and Vicodin  Home Medications   Prior to Admission medications   Medication Sig Start Date End Date Taking? Authorizing Provider  amLODipine-benazepril (LOTREL) 5-20 MG per capsule TAKE ONE CAPSULE BY MOUTH EVERY DAY 11/14/13  Yes Ricard Dillon, MD  aspirin EC 81 MG tablet Take 81 mg by mouth daily.   Yes Historical Provider, MD  atenolol-chlorthalidone (TENORETIC) 50-25 MG per tablet TAKE ONE TABLET BY MOUTH EVERY DAY 11/10/13  Yes Ricard Dillon, MD  Cyanocobalamin (B-12 PO) Take 1 tablet by mouth daily.    Yes Historical Provider, MD  famotidine (PEPCID) 20 MG tablet Take 20 mg by mouth daily.   Yes Historical Provider, MD  oxyCODONE (OXY IR/ROXICODONE) 5 MG immediate  release tablet Take 1 tablet (5 mg total) by mouth every 4 (four) hours as needed for severe pain. 06/04/14  Yes Roseanne Kaufman, MD  rosuvastatin (CRESTOR) 20 MG tablet Take 20 mg by mouth 3 (three) times a week.  02/24/11  Yes Ricard Dillon, MD  traMADol (ULTRAM) 50 MG tablet Take 2 tablets (100 mg total) by mouth every 6 (six) hours as needed. 06/04/14  Yes Roseanne Kaufman, MD   BP 139/55  Pulse 58  Temp(Src) 98.8 F (37.1 C) (Oral)  Resp 20  Wt 285 lb (129.275 kg)  SpO2 95% Physical Exam  Nursing note and vitals reviewed. Constitutional: She is oriented to person, place, and time. She appears well-developed and well-nourished.  HENT:  Head: Normocephalic and atraumatic.  Right Ear: External ear normal.  Left Ear: External ear  normal.  Nose: Nose normal.  Mouth/Throat: Oropharynx is clear and moist.  Eyes: Conjunctivae and EOM are normal. Pupils are equal, round, and reactive to light.  Neck: Normal range of motion. Neck supple. No JVD present. No tracheal deviation present. No thyromegaly present.  Cardiovascular: Normal rate, regular rhythm, normal heart sounds and intact distal pulses.  Exam reveals no gallop and no friction rub.   No murmur heard. Pulmonary/Chest: Effort normal and breath sounds normal. No stridor. No respiratory distress. She has no wheezes. She has no rales. She exhibits no tenderness.  Abdominal: Soft. Bowel sounds are normal. She exhibits no distension and no mass. There is no tenderness. There is no rebound and no guarding.  Musculoskeletal: Normal range of motion. She exhibits no edema and no tenderness.  Lymphadenopathy:    She has no cervical adenopathy.  Neurological: She is alert and oriented to person, place, and time. She has normal reflexes. No cranial nerve deficit. She exhibits normal muscle tone. Coordination normal.  Skin: Skin is warm and dry. No rash noted. No erythema. No pallor.  Psychiatric: She has a normal mood and affect. Her behavior is normal. Judgment and thought content normal.    ED Course  Procedures (including critical care time) Labs Review Labs Reviewed  CBC WITH DIFFERENTIAL - Abnormal; Notable for the following:    Lymphs Abs 4.1 (*)    All other components within normal limits  URINALYSIS, ROUTINE W REFLEX MICROSCOPIC - Abnormal; Notable for the following:    Leukocytes, UA TRACE (*)    All other components within normal limits  COMPREHENSIVE METABOLIC PANEL - Abnormal; Notable for the following:    Sodium 134 (*)    Glucose, Bld 119 (*)    BUN 39 (*)    Creatinine, Ser 2.22 (*)    AST 46 (*)    GFR calc non Af Amer 21 (*)    GFR calc Af Amer 24 (*)    Anion gap 17 (*)    All other components within normal limits  URINE MICROSCOPIC-ADD ON -  Abnormal; Notable for the following:    Casts HYALINE CASTS (*)    Crystals URIC ACID CRYSTALS (*)    All other components within normal limits  URINE CULTURE  LIPASE, BLOOD    Imaging Review No results found.   EKG Interpretation None      MDM   Final diagnoses:  Renal insufficiency  Weakness    73 yo female on bactrim for UTI c/o weakness, "swimmy-headed" feeling.  Will check labs, give fluids.   Increase in Cr noted.  No signs of uti.  Will stop bactrim, have patient f/u this week  with pcm for recheck.  Pt instructed to increase fluid intake.    Kalman Drape, MD 06/14/14 [phone]

## 2014-06-14 NOTE — ED Notes (Addendum)
Patient states she is scheduled for doppler 8/21 for plaque in neck. Patient thinks the bactrim is what's causing her to be weak as water. Patient states she doesn't feel like eating. Patient states her A1c was elevated.

## 2014-06-14 NOTE — ED Notes (Signed)
Pt presents with c/o headache for over a week. Pt says she has reported some dizziness and weakness over the past week and she says she has had these symptoms ever since she started taking bactrim after her hand surgery which was approx 10 days ago. Pt said that her BP was low at home 108/47 and her HR 57. BP is stable in triage at this time.

## 2014-06-15 ENCOUNTER — Ambulatory Visit (HOSPITAL_COMMUNITY): Payer: Medicare Other | Attending: Cardiology | Admitting: Cardiology

## 2014-06-15 DIAGNOSIS — R0989 Other specified symptoms and signs involving the circulatory and respiratory systems: Secondary | ICD-10-CM

## 2014-06-15 DIAGNOSIS — I6529 Occlusion and stenosis of unspecified carotid artery: Secondary | ICD-10-CM | POA: Insufficient documentation

## 2014-06-15 NOTE — Progress Notes (Signed)
Carotid duplex performed 

## 2014-06-16 ENCOUNTER — Telehealth (HOSPITAL_BASED_OUTPATIENT_CLINIC_OR_DEPARTMENT_OTHER): Payer: Self-pay | Admitting: Emergency Medicine

## 2014-06-16 LAB — URINE CULTURE: Colony Count: 35000

## 2014-06-16 NOTE — Telephone Encounter (Signed)
Post ED Visit - Positive Culture Follow-up  Culture report reviewed by antimicrobial stewardship pharmacist: []  Wes [place], Pharm.D., BCPS []  Heide Guile, Pharm.D., BCPS []  Alycia Rossetti, Pharm.D., BCPS []  [place], Pharm.D., BCPS, AAHIVP []  Legrand [place], Pharm.D., BCPS, AAHIVP []  Hassie Bruce, Pharm.D. [x]  Milus Glazier, Pharm.D.   Positive urine culture 35,000 colonies/ml Staph Coag negative Treated with none, treated previously with Bactrim , f/u this week with PCP and no further patient follow-up is required at this time.  Hazle Nordmann 06/16/2014, 1:45 PM

## 2014-06-16 NOTE — Progress Notes (Signed)
ED Antimicrobial Stewardship Positive Culture Follow Up   [name] is an 73 y.o. female who presented to [facility] on 06/13/2014 with a chief complaint of  Chief Complaint  Patient presents with  . Headache  . Weakness    Recent Results (from the past 720 hour(s))  URINE CULTURE     Status: None   Collection Time    06/14/14  1:07 AM      Result Value Ref Range Status   Specimen Description URINE, CLEAN CATCH   Final   Special Requests NONE   Final   Culture  Setup Time     Final   Value: 06/14/2014 12:18     Performed at [place]     Final   Value: 35,000 COLONIES/ML     Performed at [facility]   Culture     Final   Value: STAPHYLOCOCCUS SPECIES (COAGULASE NEGATIVE)     Note: RIFAMPIN AND GENTAMICIN SHOULD NOT BE USED AS SINGLE DRUGS FOR TREATMENT OF STAPH INFECTIONS.     Performed at [facility]   Report Status 06/16/2014 FINAL   Final   Organism ID, Bacteria STAPHYLOCOCCUS SPECIES (COAGULASE NEGATIVE)   Final    [x]  Patient discharged originally without antimicrobial agent and treatment is now indicated  Patient presented with headache x 1 week, dizziness, and weakness.  Patient was recently treated with Bactrim for UTI.  Most recent culture grew coagulase negative staphylococcus, which is likely a contaminant.  Patient's urine analysis was much improved after being treated with Bactrim for almost a week. No treatment necessary.   Horatio Pel 06/16/2014, 12:49 PM Infectious Diseases Pharmacist Phone# [phone]

## 2014-06-17 ENCOUNTER — Encounter: Payer: Medicare Other | Admitting: Internal Medicine

## 2014-06-18 ENCOUNTER — Telehealth: Payer: Self-pay | Admitting: Family

## 2014-06-18 NOTE — Telephone Encounter (Signed)
Pt aware I did not call her. She has an appointment scheduled for 06/19/14

## 2014-06-18 NOTE — Telephone Encounter (Signed)
I did not call pt today. I last spoke with pt on 06/10/14 concerning her labs from 06/09/14

## 2014-06-18 NOTE — Telephone Encounter (Signed)
Pt returning your call, states if you call back within a hr call her at [phone] after that call [phone]

## 2014-06-18 NOTE — Telephone Encounter (Signed)
Pt states someone called her from this office, but she could not hear. Pt states someone told her to cbpl advise?  Pt will be at work after 10 am  [phone]

## 2014-06-19 ENCOUNTER — Ambulatory Visit (INDEPENDENT_AMBULATORY_CARE_PROVIDER_SITE_OTHER): Payer: Medicare Other | Admitting: Family

## 2014-06-19 ENCOUNTER — Encounter: Payer: Self-pay | Admitting: Family

## 2014-06-19 VITALS — BP 142/64 | HR 74 | Ht 67.0 in | Wt 285.0 lb

## 2014-06-19 DIAGNOSIS — E1122 Type 2 diabetes mellitus with diabetic chronic kidney disease: Secondary | ICD-10-CM | POA: Insufficient documentation

## 2014-06-19 DIAGNOSIS — R7989 Other specified abnormal findings of blood chemistry: Secondary | ICD-10-CM

## 2014-06-19 DIAGNOSIS — E782 Mixed hyperlipidemia: Secondary | ICD-10-CM | POA: Insufficient documentation

## 2014-06-19 DIAGNOSIS — R799 Abnormal finding of blood chemistry, unspecified: Secondary | ICD-10-CM

## 2014-06-19 DIAGNOSIS — N183 Chronic kidney disease, stage 3 unspecified: Secondary | ICD-10-CM | POA: Insufficient documentation

## 2014-06-19 DIAGNOSIS — IMO0001 Reserved for inherently not codable concepts without codable children: Secondary | ICD-10-CM

## 2014-06-19 DIAGNOSIS — E1165 Type 2 diabetes mellitus with hyperglycemia: Principal | ICD-10-CM

## 2014-06-19 DIAGNOSIS — E1169 Type 2 diabetes mellitus with other specified complication: Secondary | ICD-10-CM | POA: Insufficient documentation

## 2014-06-19 MED ORDER — ONETOUCH DELICA LANCETS 33G MISC: Status: DC

## 2014-06-19 MED ORDER — GLUCOSE BLOOD VI STRP: ORAL_STRIP | Status: DC

## 2014-06-19 NOTE — Patient Instructions (Signed)
Diabetes and Exercise Exercising regularly is important. It is not just about losing weight. It has many health benefits, such as:  Improving your overall fitness, flexibility, and endurance.  Increasing your bone density.  Helping with weight control.  Decreasing your body fat.  Increasing your muscle strength.  Reducing stress and tension.  Improving your overall health. People with diabetes who exercise gain additional benefits because exercise:  Reduces appetite.  Improves the body's use of blood sugar (glucose).  Helps lower or control blood glucose.  Decreases blood pressure.  Helps control blood lipids (such as cholesterol and triglycerides).  Improves the body's use of the hormone insulin by:  Increasing the body's insulin sensitivity.  Reducing the body's insulin needs.  Decreases the risk for heart disease because exercising:  Lowers cholesterol and triglycerides levels.  Increases the levels of good cholesterol (such as high-density lipoproteins [HDL]) in the body.  Lowers blood glucose levels. YOUR ACTIVITY PLAN  Choose an activity that you enjoy and set realistic goals. Your health care provider or diabetes educator can help you make an activity plan that works for you. Exercise regularly as directed by your health care provider. This includes:  Performing resistance training twice a week such as push-ups, sit-ups, lifting weights, or using resistance bands.  Performing 150 minutes of cardio exercises each week such as walking, running, or playing sports.  Staying active and spending no more than 90 minutes at one time being inactive. Even short bursts of exercise are good for you. Three 10-minute sessions spread throughout the day are just as beneficial as a single 30-minute session. Some exercise ideas include:  Taking the dog for a walk.  Taking the stairs instead of the elevator.  Dancing to your favorite song.  Doing an exercise  video.  Doing your favorite exercise with a friend. RECOMMENDATIONS FOR EXERCISING WITH TYPE 1 OR TYPE 2 DIABETES   Check your blood glucose before exercising. If blood glucose levels are greater than 240 mg/dL, check for urine ketones. Do not exercise if ketones are present.  Avoid injecting insulin into areas of the body that are going to be exercised. For example, avoid injecting insulin into:  The arms when playing tennis.  The legs when jogging.  Keep a record of:  Food intake before and after you exercise.  Expected peak times of insulin action.  Blood glucose levels before and after you exercise.  The type and amount of exercise you have done.  Review your records with your health care provider. Your health care provider will help you to develop guidelines for adjusting food intake and insulin amounts before and after exercising.  If you take insulin or oral hypoglycemic agents, watch for signs and symptoms of hypoglycemia. They include:  Dizziness.  Shaking.  Sweating.  Chills.  Confusion.  Drink plenty of water while you exercise to prevent dehydration or heat stroke. Body water is lost during exercise and must be replaced.  Talk to your health care provider before starting an exercise program to make sure it is safe for you. Remember, almost any type of activity is better than none. Document Released: 01/06/2004 Document Revised: 03/02/2014 Document Reviewed: 03/25/2013 Exit[employer] Patient Information 2015 [employer]. This information is not intended to replace advice given to you by your health care provider. Make sure you discuss any questions you have with your health care provider.  

## 2014-06-19 NOTE — Progress Notes (Signed)
Subjective:    Patient ID: [name], female    DOB: November 21, 1940, 73 y.o.   MRN: [id number]  HPI  73 year old AAF, nonsmoker, obese, newly diagnosed type 2 diabetes with an A1c of 7.6. She also has a history of hypertension hyperlipidemia. Currently takes Crestor 20 mg 3 times per week. LDL cholesterol 169. Has tried to change her diet but does not routinely exercise.  Patient was seen in the emergency department on 06/09/2014 and found to have a creatinine of 2.2. He was given 2 L of fluid. Patient felt better. Baseline creatinine is normal.    Review of Systems  Constitutional: Negative.   Respiratory: Negative.   Cardiovascular: Negative.   Gastrointestinal: Negative.   Endocrine: Negative.   Genitourinary: Positive for frequency.  Musculoskeletal: Negative.   Skin: Negative.   Allergic/Immunologic: Negative.   Neurological: Negative.   Hematological: Negative.   Psychiatric/Behavioral: Negative.    Past Medical History  Diagnosis Date  . Hyperlipidemia   . Hypertension   . Obesity   . Diverticulitis hx of  . Diverticulosis   . Abdominal pain   . Constipation   . Colon adenoma   . Complication of anesthesia 2006-at [facility]    breathing problems-no BP med given prior  . Arthritis   . GERD (gastroesophageal reflux disease)   . Pneumonia 2005  . Numbness and tingling in right hand     pt. states has numbness of right hand very frequently-watch positioning  . Personal history of colonic polyps-adenoma 08/26/2008    History   Social History  . Marital Status: Married    Spouse Name: N/A    Number of Children: N/A  . Years of Education: N/A   Occupational History  . Not on file.   Social History Main Topics  . Smoking status: Never Smoker   . Smokeless tobacco: Never Used  . Alcohol Use: No  . Drug Use: No  . Sexual Activity: Yes   Other Topics Concern  . Not on file   Social History Narrative  . No narrative on file    Past Surgical History    Procedure Laterality Date  . Skin draft for trauma    . Abdominal hysterectomy    . Bladder tack    . Colonoscopy [id number]    . Oophorectomy    . Ureter repair for tyransected left ureter    . Temporary ureter stent    . 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 Joint replacement    . Colon resection  2008    hx diverticulosis  . Carpal tunnel release Right 06/04/2014    Procedure: RIGHT CARPAL TUNNEL RELEASE;  Surgeon: Roseanne Kaufman, MD;  Location: [facility];  Service: Orthopedics;  Laterality: Right;    Family History  Problem Relation Age of Onset  . Breast cancer Mother   . Prostate cancer Father   . Colon cancer Father 65    Allergies  Allergen Reactions  . Bactrim [Sulfamethoxazole-Tmp Ds]   . Lipitor [Atorvastatin Calcium] Itching  . Vicodin [Hydrocodone-Acetaminophen]     hallucinations    Current Outpatient Prescriptions on File Prior to Visit  Medication Sig Dispense Refill  . amLODipine-benazepril (LOTREL) 5-20 MG per capsule TAKE ONE CAPSULE BY MOUTH EVERY DAY  30 capsule  11  . aspirin EC 81 MG tablet Take 81 mg  by mouth daily.      Marland Kitchen atenolol-chlorthalidone (TENORETIC) 50-25 MG per tablet TAKE ONE TABLET BY MOUTH EVERY DAY  30 tablet  11  . Cyanocobalamin (B-12 PO) Take 1 tablet by mouth daily.       . famotidine (PEPCID) 20 MG tablet Take 20 mg by mouth daily.      . rosuvastatin (CRESTOR) 20 MG tablet Take 20 mg by mouth 3 (three) times a week.        No current facility-administered medications on file prior to visit.    BP 142/64  Pulse 74  Ht 5\' 7"  (1.702 m)  Wt 285 lb (129.275 kg)  BMI 44.63 kg/m2chart    Objective:   Physical Exam  Constitutional: She is oriented to person, place, and time. She appears well-developed and well-nourished.  HENT:  Right Ear: External ear normal.  Left Ear:  External ear normal.  Nose: Nose normal.  Mouth/Throat: Oropharynx is clear and moist.  Neck: Normal range of motion. Neck supple. No thyromegaly present.  Cardiovascular: Normal rate, regular rhythm and normal heart sounds.   Pulmonary/Chest: Effort normal and breath sounds normal.  Abdominal: Soft. Bowel sounds are normal.  Musculoskeletal: Normal range of motion.  Neurological: She is alert and oriented to person, place, and time.  Skin: Skin is warm and dry.  Psychiatric: She has a normal mood and affect.          Assessment & Plan:  [name] was seen today for no specified reason.  Diagnoses and associated orders for this visit:  Type II or unspecified type diabetes mellitus without mention of complication, uncontrolled - Basic Metabolic Panel  Mixed hyperlipidemia  Abnormal serum creatinine level  Other Orders - glucose blood (ONETOUCH VERIO) test strip; Use to check blood glucose once daily - ONETOUCH DELICA LANCETS 56C MISC; Use to check blood glucose once daily    Patient declines medication. Would like to try diet and exercise. Therefore, we will recheck in 3 months to see if weight is reduced, cholesterol has improved, and glucose is stable.

## 2014-06-20 LAB — BASIC METABOLIC PANEL
BUN: 31 mg/dL — ABNORMAL HIGH (ref 6–23)
CO2: 25 mEq/L (ref 19–32)
Calcium: 9.3 mg/dL (ref 8.4–10.5)
Chloride: 102 mEq/L (ref 96–112)
Creatinine, Ser: 1.3 mg/dL — ABNORMAL HIGH (ref 0.4–1.2)
GFR: 53.18 mL/min — ABNORMAL LOW (ref >60.00)
Glucose, Bld: 90 mg/dL (ref 70–99)
Potassium: 3.9 mEq/L (ref 3.5–5.1)
Sodium: 136 mEq/L (ref 135–145)

## 2014-07-16 ENCOUNTER — Other Ambulatory Visit: Payer: Self-pay | Admitting: Orthopedic Surgery

## 2014-07-16 NOTE — Progress Notes (Signed)
Preoperative surgical orders have been place into the Epic hospital system for [facility] on 07/16/2014, 1:55 PM  by Mickel Crow for surgery on 08/17/2014.  Preop Total Knee orders including Experal, IV Tylenol, and IV Decadron as long as there are no contraindications to the above medications. Arlee Muslim, PA-C

## 2014-08-05 ENCOUNTER — Encounter (HOSPITAL_COMMUNITY): Payer: Self-pay | Admitting: Pharmacy Technician

## 2014-08-11 ENCOUNTER — Encounter (HOSPITAL_COMMUNITY): Payer: Self-pay

## 2014-08-11 ENCOUNTER — Ambulatory Visit (HOSPITAL_COMMUNITY)
Admission: RE | Admit: 2014-08-11 | Discharge: 2014-08-11 | Disposition: A | Discharge status: Home / Self Care | Payer: Medicare Other | Source: Ambulatory Visit | Attending: Anesthesiology | Admitting: Anesthesiology

## 2014-08-11 ENCOUNTER — Encounter (HOSPITAL_COMMUNITY)
Admission: RE | Admit: 2014-08-11 | Discharge: 2014-08-11 | Disposition: A | Discharge status: Home / Self Care | Payer: Medicare Other | Source: Ambulatory Visit | Attending: Orthopedic Surgery | Admitting: Orthopedic Surgery

## 2014-08-11 DIAGNOSIS — Z01818 Encounter for other preprocedural examination: Secondary | ICD-10-CM | POA: Insufficient documentation

## 2014-08-11 DIAGNOSIS — M179 Osteoarthritis of knee, unspecified: Secondary | ICD-10-CM | POA: Insufficient documentation

## 2014-08-11 DIAGNOSIS — I10 Essential (primary) hypertension: Secondary | ICD-10-CM | POA: Insufficient documentation

## 2014-08-11 HISTORY — DX: Age-related osteoporosis without current pathological fracture: M81.0

## 2014-08-11 HISTORY — DX: Pain, unspecified: R52

## 2014-08-11 HISTORY — DX: Type 2 diabetes mellitus without complications: E11.9

## 2014-08-11 HISTORY — DX: Personal history of other specified conditions: Z87.898

## 2014-08-11 HISTORY — DX: Personal history of other diseases of the digestive system: Z87.19

## 2014-08-11 HISTORY — DX: Urinary tract infection, site not specified: N39.0

## 2014-08-11 HISTORY — DX: Personal history of other infectious and parasitic diseases: Z86.19

## 2014-08-11 LAB — COMPREHENSIVE METABOLIC PANEL
ALT: 31 U/L (ref 0–35)
AST: 35 U/L (ref 0–37)
Albumin: 3.8 g/dL (ref 3.5–5.2)
Alkaline Phosphatase: 42 U/L (ref 39–117)
Anion gap: 15 (ref 5–15)
BUN: 23 mg/dL (ref 6–23)
CO2: 25 mEq/L (ref 19–32)
Calcium: 9.4 mg/dL (ref 8.4–10.5)
Chloride: 103 mEq/L (ref 96–112)
Creatinine, Ser: 1.09 mg/dL (ref 0.50–1.10)
GFR calc Af Amer: 57 mL/min — ABNORMAL LOW (ref >90)
GFR calc non Af Amer: 49 mL/min — ABNORMAL LOW (ref >90)
Glucose, Bld: 104 mg/dL — ABNORMAL HIGH (ref 70–99)
Potassium: 4.6 mEq/L (ref 3.7–5.3)
Sodium: 143 mEq/L (ref 137–147)
Total Bilirubin: 0.9 mg/dL (ref 0.3–1.2)
Total Protein: 7.4 g/dL (ref 6.0–8.3)

## 2014-08-11 LAB — URINALYSIS, ROUTINE W REFLEX MICROSCOPIC
Bilirubin Urine: NEGATIVE
Glucose, UA: NEGATIVE mg/dL
Hgb urine dipstick: NEGATIVE
Ketones, ur: NEGATIVE mg/dL
Leukocytes, UA: NEGATIVE
Nitrite: NEGATIVE
Protein, ur: NEGATIVE mg/dL
Specific Gravity, Urine: 1.017 (ref 1.005–1.030)
Urobilinogen, UA: 0.2 mg/dL (ref 0.0–1.0)
pH: 6 (ref 5.0–8.0)

## 2014-08-11 LAB — CBC
HCT: 42.5 % (ref 36.0–46.0)
Hemoglobin: 14.4 g/dL (ref 12.0–15.0)
MCH: 29.5 pg (ref 26.0–34.0)
MCHC: 33.9 g/dL (ref 30.0–36.0)
MCV: 87.1 fL (ref 78.0–100.0)
Platelets: 220 10*3/uL (ref 150–400)
RBC: 4.88 MIL/uL (ref 3.87–5.11)
RDW: 12.9 % (ref 11.5–15.5)
WBC: 8.8 10*3/uL (ref 4.0–10.5)

## 2014-08-11 LAB — SURGICAL PCR SCREEN
MRSA, PCR: NEGATIVE
Staphylococcus aureus: NEGATIVE

## 2014-08-11 LAB — PROTIME-INR
INR: 1.04 (ref 0.00–1.49)
Prothrombin Time: 13.7 seconds (ref 11.6–15.2)

## 2014-08-11 LAB — APTT: aPTT: 31 seconds (ref 24–37)

## 2014-08-11 IMAGING — CR DG CHEST 2V
2 series · 2 of 2 positions shown · non-contrast
Comparison: [DATE]

CLINICAL DATA: Preop knee replacement.  Hypertension

EXAM:
CHEST  2 VIEW

[w chest pa]
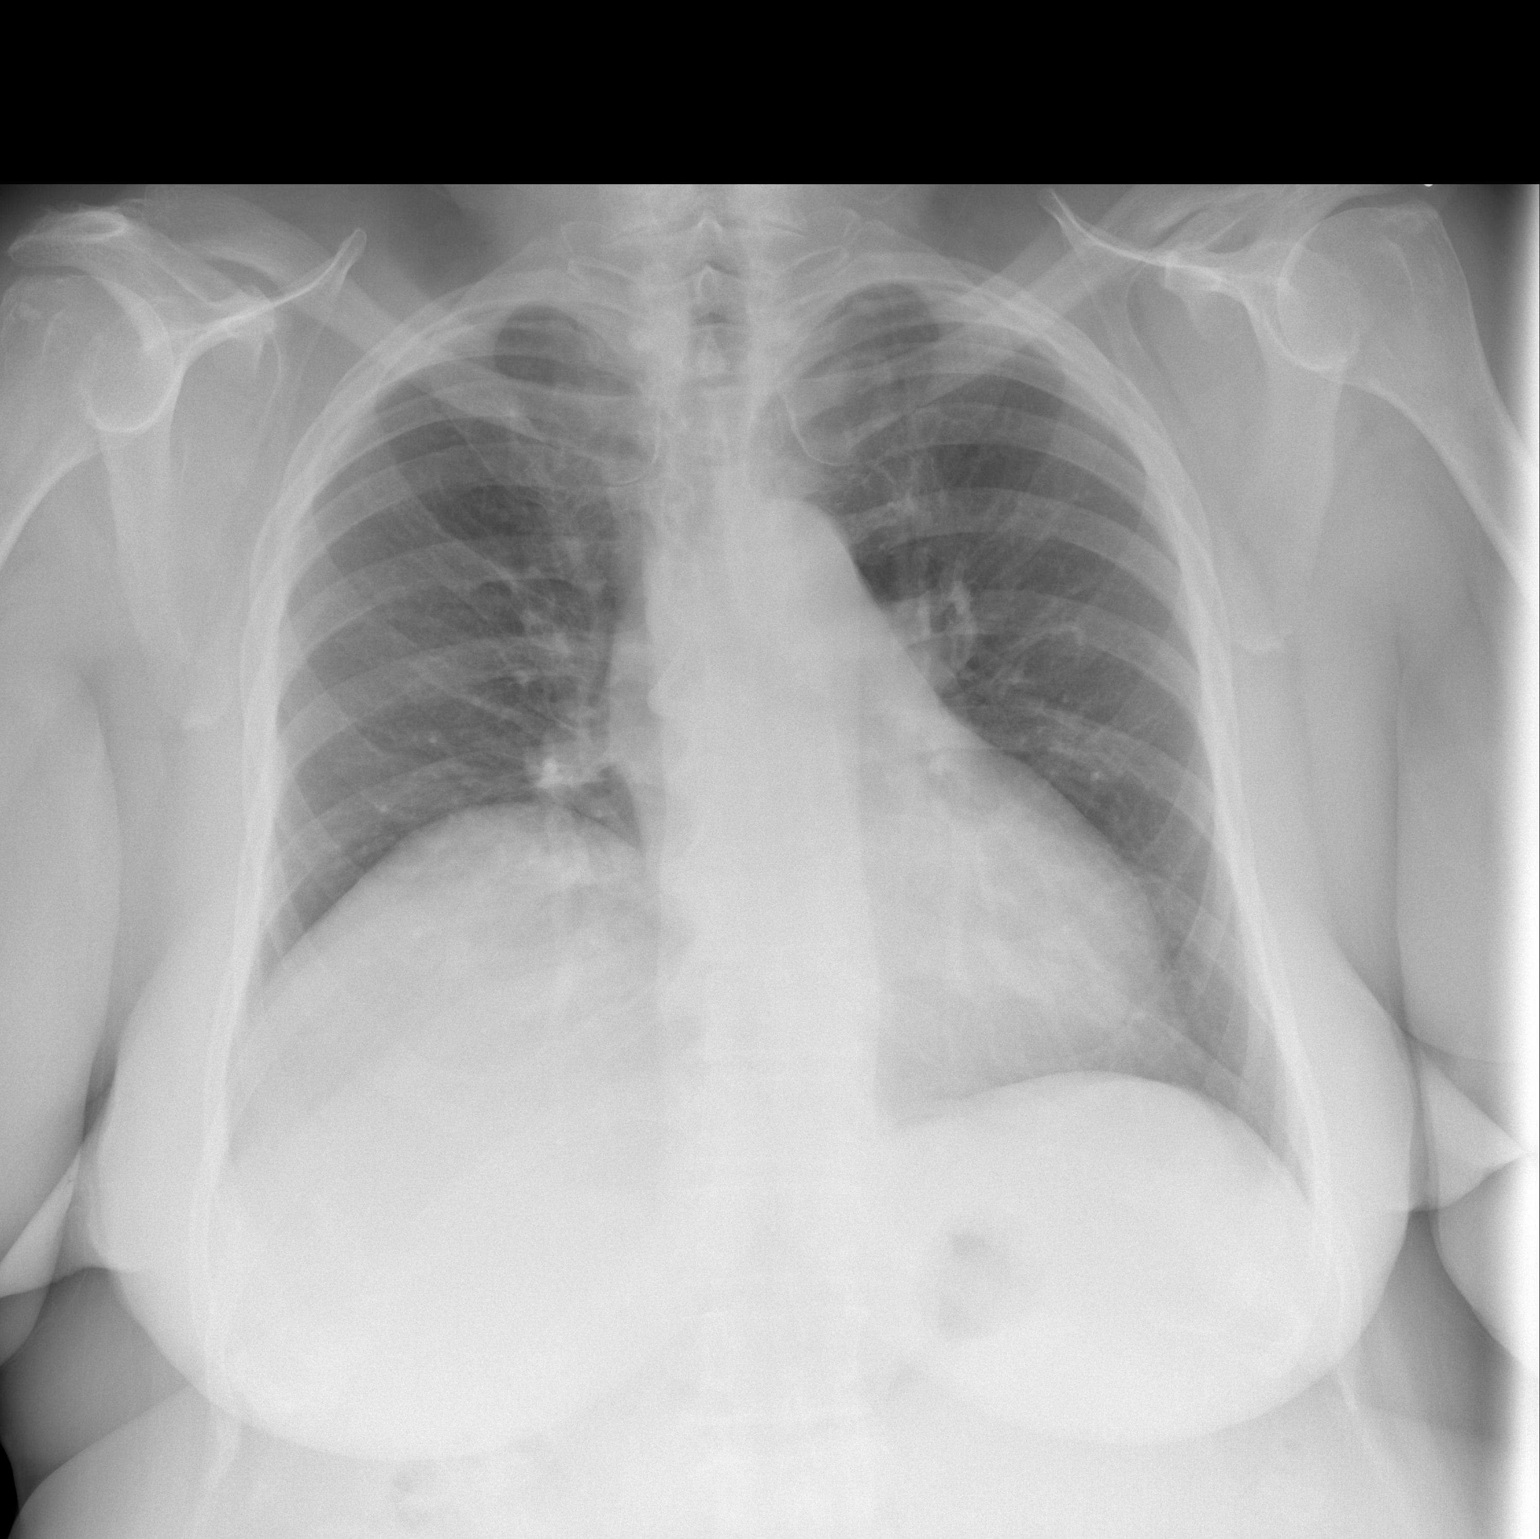

[w chest lat]
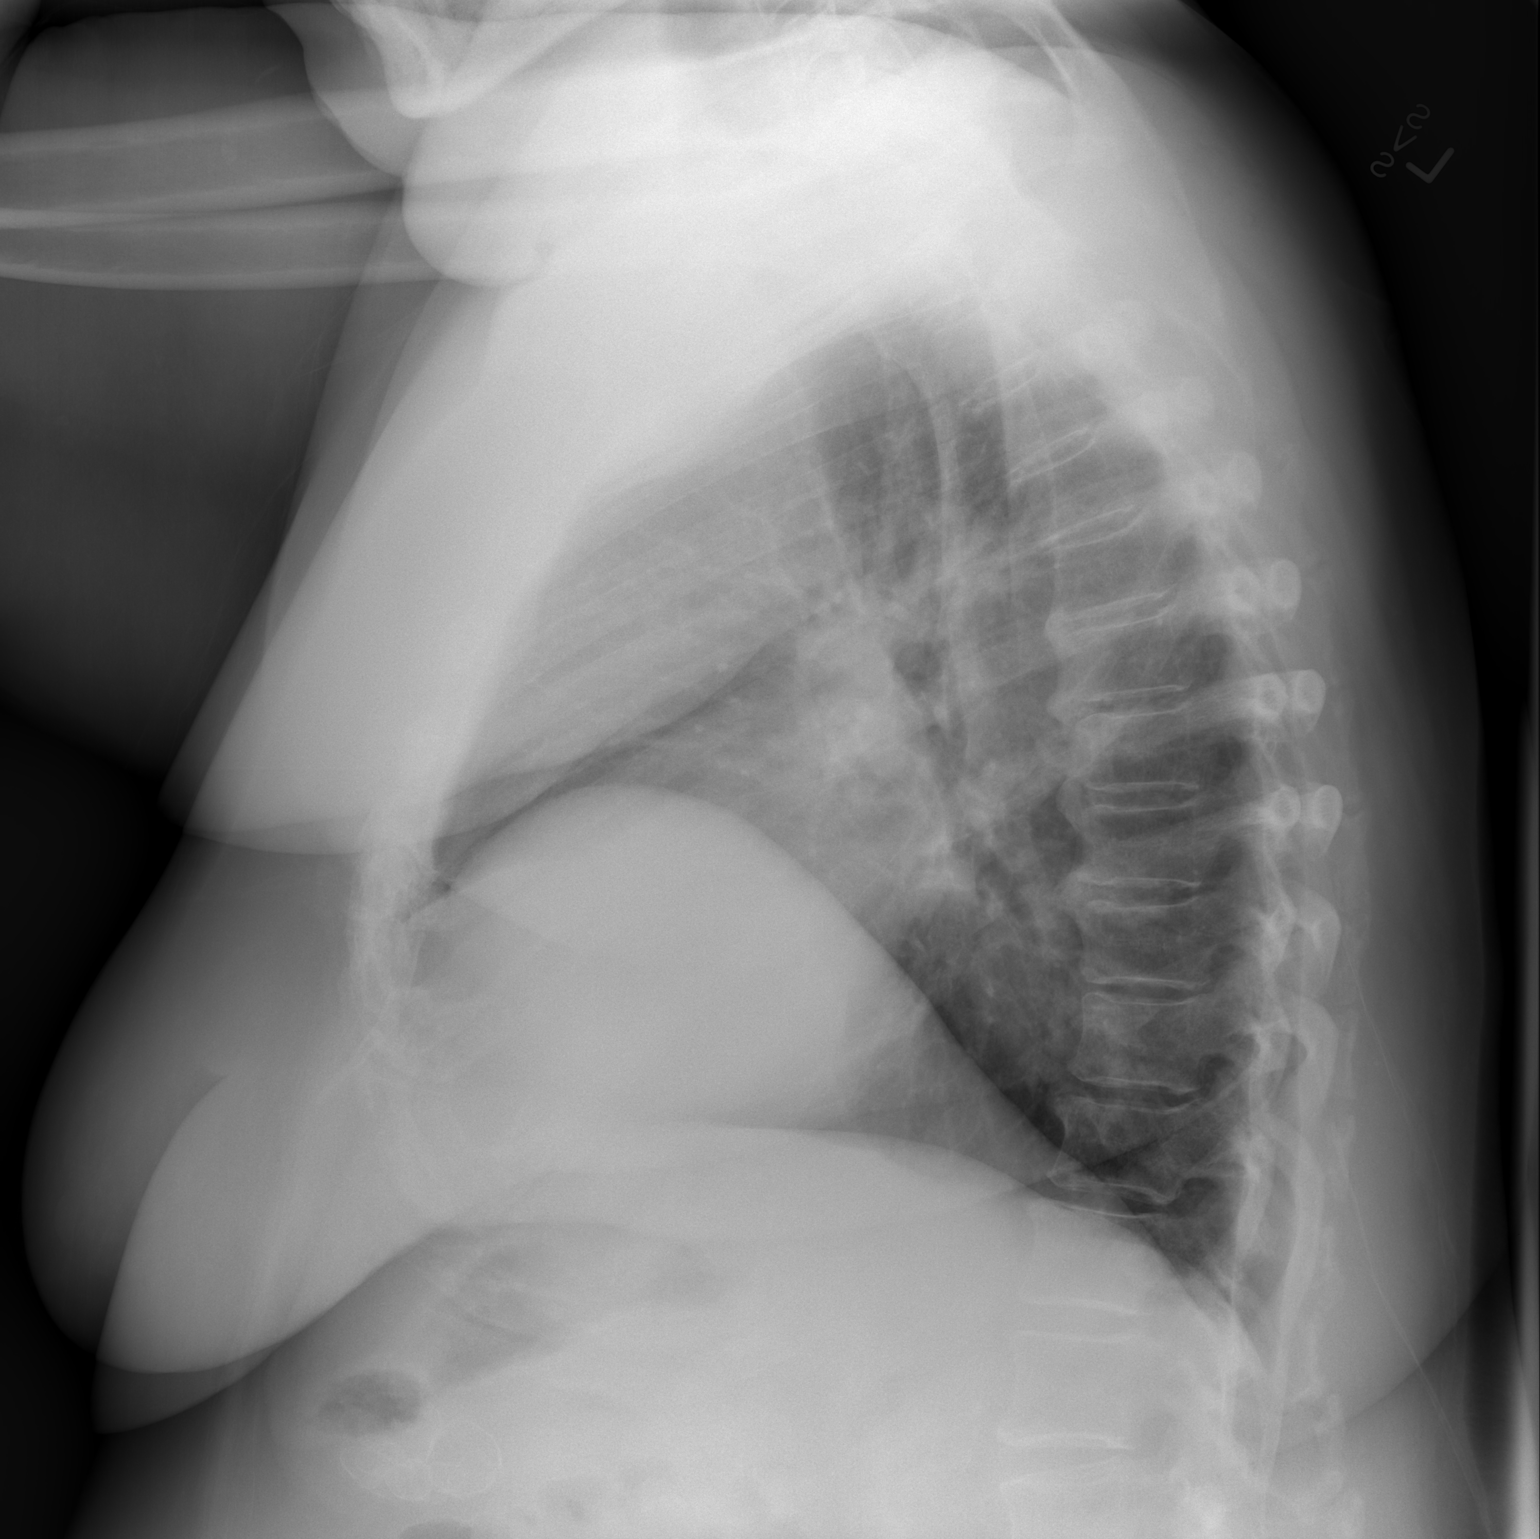

[2 of 2 positions shown; findings below may reference images not displayed]

FINDINGS: Chronic elevation right hemidiaphragm unchanged. Mild right lower
lobe atelectasis.

Mild cardiac enlargement without heart failure or effusion. Left
lung clear.
IMPRESSION: No active cardiopulmonary disease.

## 2014-08-11 NOTE — Patient Instructions (Addendum)
YOUR SURGERY IS SCHEDULED AT [facility]  ON:  Monday  10/19  REPORT TO  SHORT STAY CENTER AT:  9:40 AM    DO NOT EAT OR DRINK ANYTHING AFTER MIDNIGHT THE NIGHT BEFORE YOUR SURGERY.  YOU MAY BRUSH YOUR TEETH, RINSE OUT YOUR MOUTH--BUT NO WATER, NO FOOD, NO CHEWING GUM, NO MINTS, NO CANDIES, NO CHEWING TOBACCO.  PLEASE TAKE THE FOLLOWING MEDICATIONS THE AM OF YOUR SURGERY WITH A FEW SIPS OF WATER:  Famotidine (Pepcid)  crestor   DO NOT BRING VALUABLES, MONEY, CREDIT CARDS.  DO NOT WEAR JEWELRY, MAKE-UP, NAIL POLISH AND NO METAL PINS OR CLIPS IN YOUR HAIR. CONTACT LENS, DENTURES / PARTIALS, GLASSES SHOULD NOT BE WORN TO SURGERY AND IN MOST CASES-HEARING AIDS WILL NEED TO BE REMOVED.  BRING YOUR GLASSES CASE, ANY EQUIPMENT NEEDED FOR YOUR CONTACT LENS. FOR PATIENTS ADMITTED TO THE HOSPITAL--CHECK OUT TIME THE DAY OF DISCHARGE IS 11:00 AM.  ALL INPATIENT ROOMS ARE PRIVATE - WITH BATHROOM, TELEPHONE, TELEVISION AND WIFI INTERNET.   PLEASE BE AWARE THAT YOU MAY NEED ADDITIONAL BLOOD DRAWN DAY OF YOUR SURGERY  _______________________________________________________________________   [facility] - Preparing for Surgery Before surgery, you can play an important role.  Because skin is not sterile, your skin needs to be as free of germs as possible.  You can reduce the number of germs on your skin by washing with CHG (chlorahexidine gluconate) soap before surgery.  CHG is an antiseptic cleaner which kills germs and bonds with the skin to continue killing germs even after washing. Please DO NOT use if you have an allergy to CHG or antibacterial soaps.  If your skin becomes reddened/irritated stop using the CHG and inform your nurse when you arrive at Short Stay. Do not shave (including legs and underarms) for at least 48 hours prior to the first CHG shower.  You may shave your face/neck. Please follow these instructions carefully:  1.  Shower with CHG Soap the night before surgery  and the  morning of Surgery.  2.  If you choose to wash your hair, wash your hair first as usual with your  normal  shampoo.  3.  After you shampoo, rinse your hair and body thoroughly to remove the  shampoo.                           4.  Use CHG as you would any other liquid soap.  You can apply chg directly  to the skin and wash                       Gently with a scrungie or clean washcloth.  5.  Apply the CHG Soap to your body ONLY FROM THE NECK DOWN.   Do not use on face/ open                           Wound or open sores. Avoid contact with eyes, ears mouth and genitals (private parts).                       Wash face,  Genitals (private parts) with your normal soap.             6.  Wash thoroughly, paying special attention to the area where your surgery  will be performed.  7.  Thoroughly rinse your body with warm  water from the neck down.  8.  DO NOT shower/wash with your normal soap after using and rinsing off  the CHG Soap.                9.  Pat yourself dry with a clean towel.            10.  Wear clean pajamas.            11.  Place clean sheets on your bed the night of your first shower and do not  sleep with pets. Day of Surgery : Do not apply any lotions/deodorants the morning of surgery.  Please wear clean clothes to the hospital/surgery center.  FAILURE TO FOLLOW THESE INSTRUCTIONS MAY RESULT IN THE CANCELLATION OF YOUR SURGERY PATIENT SIGNATURE_________________________________  NURSE SIGNATURE__________________________________  ________________________________________________________________________   [name]  An incentive spirometer is a tool that can help keep your lungs clear and active. This tool measures how well you are filling your lungs with each breath. Taking long deep breaths may help reverse or decrease the chance of developing breathing (pulmonary) problems (especially infection) following:  A long period of time when you are unable to move or  be active. BEFORE THE PROCEDURE   If the spirometer includes an indicator to show your best effort, your nurse or respiratory therapist will set it to a desired goal.  If possible, sit up straight or lean slightly forward. Try not to slouch.  Hold the incentive spirometer in an upright position. INSTRUCTIONS FOR USE  1. Sit on the edge of your bed if possible, or sit up as far as you can in bed or on a chair. 2. Hold the incentive spirometer in an upright position. 3. Breathe out normally. 4. Place the mouthpiece in your mouth and seal your lips tightly around it. 5. Breathe in slowly and as deeply as possible, raising the piston or the ball toward the top of the column. 6. Hold your breath for 3-5 seconds or for as long as possible. Allow the piston or ball to fall to the bottom of the column. 7. Remove the mouthpiece from your mouth and breathe out normally. 8. Rest for a few seconds and repeat Steps 1 through 7 at least 10 times every 1-2 hours when you are awake. Take your time and take a few normal breaths between deep breaths. 9. The spirometer may include an indicator to show your best effort. Use the indicator as a goal to work toward during each repetition. 10. After each set of 10 deep breaths, practice coughing to be sure your lungs are clear. If you have an incision (the cut made at the time of surgery), support your incision when coughing by placing a pillow or rolled up towels firmly against it. Once you are able to get out of bed, walk around indoors and cough well. You may stop using the incentive spirometer when instructed by your caregiver.  RISKS AND COMPLICATIONS  Take your time so you do not get dizzy or light-headed.  If you are in pain, you may need to take or ask for pain medication before doing incentive spirometry. It is harder to take a deep breath if you are having pain. AFTER USE  Rest and breathe slowly and easily.  It can be helpful to keep track of a log  of your progress. Your caregiver can provide you with a simple table to help with this. If you are using the spirometer at home, follow these instructions:  SEEK MEDICAL CARE IF:   You are having difficultly using the spirometer.  You have trouble using the spirometer as often as instructed.  Your pain medication is not giving enough relief while using the spirometer.  You develop fever of 100.5 F (38.1 C) or higher. SEEK IMMEDIATE MEDICAL CARE IF:   You cough up bloody sputum that had not been present before.  You develop fever of 102 F (38.9 C) or greater.  You develop worsening pain at or near the incision site. MAKE SURE YOU:   Understand these instructions.  Will watch your condition.  Will get help right away if you are not doing well or get worse. Document Released: 02/26/2007 Document Revised: 01/08/2012 Document Reviewed: 04/29/2007 Exit[employer] Patient Information 2014 [employer], [place].   ________________________________________________________________________  WHAT IS A BLOOD TRANSFUSION? Blood Transfusion Information  A transfusion is the replacement of blood or some of its parts. Blood is made up of multiple cells which provide different functions.  Red blood cells carry oxygen and are used for blood loss replacement.  White blood cells fight against infection.  Platelets control bleeding.  Plasma helps clot blood.  Other blood products are available for specialized needs, such as hemophilia or other clotting disorders. BEFORE THE TRANSFUSION  Who gives blood for transfusions?   Healthy volunteers who are fully evaluated to make sure their blood is safe. This is blood bank blood. Transfusion therapy is the safest it has ever been in the practice of medicine. Before blood is taken from a donor, a complete history is taken to make sure that person has no history of diseases nor engages in risky social behavior (examples are intravenous drug use or sexual  activity with multiple partners). The donor's travel history is screened to minimize risk of transmitting infections, such as malaria. The donated blood is tested for signs of infectious diseases, such as HIV and hepatitis. The blood is then tested to be sure it is compatible with you in order to minimize the chance of a transfusion reaction. If you or a relative donates blood, this is often done in anticipation of surgery and is not appropriate for emergency situations. It takes many days to process the donated blood. RISKS AND COMPLICATIONS Although transfusion therapy is very safe and saves many lives, the main dangers of transfusion include:   Getting an infectious disease.  Developing a transfusion reaction. This is an allergic reaction to something in the blood you were given. Every precaution is taken to prevent this. The decision to have a blood transfusion has been considered carefully by your caregiver before blood is given. Blood is not given unless the benefits outweigh the risks. AFTER THE TRANSFUSION  Right after receiving a blood transfusion, you will usually feel much better and more energetic. This is especially true if your red blood cells have gotten low (anemic). The transfusion raises the level of the red blood cells which carry oxygen, and this usually causes an energy increase.  The nurse administering the transfusion will monitor you carefully for complications. HOME CARE INSTRUCTIONS  No special instructions are needed after a transfusion. You may find your energy is better. Speak with your caregiver about any limitations on activity for underlying diseases you may have. SEEK MEDICAL CARE IF:   Your condition is not improving after your transfusion.  You develop redness or irritation at the intravenous (IV) site. SEEK IMMEDIATE MEDICAL CARE IF:  Any of the following symptoms occur over the next 12 hours:  Shaking chills.  You have a temperature by mouth above 102 F  (38.9 C), not controlled by medicine.  Chest, back, or muscle pain.  People around you feel you are not acting correctly or are confused.  Shortness of breath or difficulty breathing.  Dizziness and fainting.  You get a rash or develop hives.  You have a decrease in urine output.  Your urine turns a dark color or changes to pink, red, or brown. Any of the following symptoms occur over the next 10 days:  You have a temperature by mouth above 102 F (38.9 C), not controlled by medicine.  Shortness of breath.  Weakness after normal activity.  The white part of the eye turns yellow (jaundice).  You have a decrease in the amount of urine or are urinating less often.  Your urine turns a dark color or changes to pink, red, or brown. Document Released: 10/13/2000 Document Revised: 01/08/2012 Document Reviewed: 06/01/2008 [facility] Patient Information 2014 [place], [place].  _______________________________________________________________________

## 2014-08-11 NOTE — Pre-Procedure Instructions (Signed)
EKG REPORT IN EPIC FROM 06-03-14. CXR WAS DONE TODAY PREOP AT [facility].

## 2014-08-11 NOTE — Progress Notes (Signed)
08/11/14 1414  [place]  Have you ever been diagnosed with sleep apnea through a sleep study? No  Do you snore loudly (loud enough to be heard through closed doors)?  0  Do you often feel tired, fatigued, or sleepy during the daytime? 0  Has anyone observed you stop breathing during your sleep? 0  Do you have, or are you being treated for high blood pressure? 1  BMI more than 35 kg/m2? 1  Age over 72 years old? 1  Neck circumference greater than 40 cm/16 inches? 1  Gender: 0  Obstructive Sleep Apnea Score 4

## 2014-08-16 ENCOUNTER — Other Ambulatory Visit: Payer: Self-pay | Admitting: Orthopedic Surgery

## 2014-08-16 MED ORDER — DEXTROSE 5 % IV SOLN
3.0000 g | INTRAVENOUS | Status: AC
  Administered 2014-08-17: 3 g via INTRAVENOUS
  Filled 2014-08-16: qty 3000

## 2014-08-16 NOTE — H&P (Signed)
[name] DOB: 08-28-1941 Married / Language: Cleophus Molt / Race: Black or African American Female Date of Admission:  08/17/2014 Chief Complaint:  Left Knee Pain History of Present Illness The patient is a 73 year old female who comes in for a preoperative History and Physical. The patient is scheduled for a left total knee arthroplasty to be performed by Dr. Dione Plover. Aluisio, MD at [facility] on 08-17-2014. The patient is a 73 year old female who presents for follow up of their knee. The patient is being followed for their bilateral knee pain. They are now over 2 years out from right total knee. The patient feels that they are doing poorly with the left knee thoug and reports their pain level to be moderate. The patient has reported improvement of their symptoms with: activity modification. She is using a cane for support. Her right knee is doing very well. The left knee is getting progressively worse. It is as bad as the right one was prior to when she had surgery on the right. She states it hurts at all times. It is limiting what she can and can not do. She has had cortisone and visco supplements in the past without benefit. She is real pleased with how the right knee has done and wants to get something done about the left side. She states she has had no pain with it and she is getting around very well on the right. The most predictable means of improving her pain and function at this time is a total knee arthroplasty. We discussed this in detail. I told her about the differences now as compared to when she had the other one two years ago with the use of EXPAREL and knee rapid therapy. She is now ready to proceed with the left knee repalcement at this time. They have been treated conservatively in the past for the above stated problem and despite conservative measures, they continue to have progressive pain and severe functional limitations and dysfunction. They have failed non-operative  management including home exercise, medications, and injections. It is felt that they would benefit from undergoing total joint replacement. Risks and benefits of the procedure have been discussed with the patient and they elect to proceed with surgery. There are no active contraindications to surgery such as ongoing infection or rapidly progressive neurological disease.  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 mother and father  Social History Current work status working part time Never consumed alcohol 04/07/2014: Never consumed alcohol No history of drug/alcohol rehab Marital status Married. married Exercise Exercises weekly; does running / walking Exercises rarely Living situation Lives with spouse. live with spouse Children 6 5 or more Alcohol use never consumed alcohol Tobacco / smoke exposure 04/07/2014: yes no No alcohol use Tobacco use Never smoker. 62/70/3500 never smoker Illicit drug use no Post-Surgical Plans Plan is to go to rehab. Pain Contract no Not under pain contract Drug/Alcohol Rehab (Currently) no Drug/Alcohol Rehab (Previously) no  Medication History  Aleve (220MG  Capsule, Oral)  Active. Aspirin Adult Low Strength (81MG  Tablet DR, Oral) Active. Crestor (20MG  Tablet, Oral) Active. Atenolol (Oral) Specific dose unknown - Active. Lotrel (Oral) Specific dose unknown - Active.  Past Surgical History Appendectomy S/P partial hysterectomy (V88.02) Breast Duct Resection Benign S/P partial resection of colon (V45.89) No pertinent past surgical history Hx of oophorectomy (V45.77) Skin Graft Left Arm - Traumatic Compression Injury Left Upper Arm   Review of Systems General Not Present- Chills, Fatigue, Fever, Memory Loss, Night Sweats, Weight Gain and Weight Loss. Skin Not Present- Eczema, Hives, Itching, Lesions and Rash. HEENT Not Present- Dentures, Double Vision, Headache, Hearing Loss, Tinnitus and Visual Loss. Respiratory Not Present- Allergies, Chronic Cough, Cough, Coughing up blood, Shortness of breath at rest and Shortness of breath with exertion. Cardiovascular Present- Palpitations (Ocassional Plapitations). Not Present- Chest Pain, Difficulty Breathing Lying Down, Murmur, Racing/skipping heartbeats and Swelling. Gastrointestinal Not Present- Abdominal Pain, Bloody Stool, Constipation, Diarrhea, Difficulty Swallowing, Heartburn, Jaundice, Loss of appetitie, Nausea and Vomiting. Female Genitourinary Not Present- Blood in Urine, Discharge, Flank Pain, Incontinence, Painful Urination, Urgency, Urinary frequency, Urinary Retention, Urinating at Night and Weak urinary stream. Musculoskeletal Present- Joint Pain. Not Present- Back Pain, Joint Swelling, Morning Stiffness, Muscle Pain, Muscle Weakness and Spasms. Neurological Not Present- Blackout spells, Difficulty with balance, Dizziness, Paralysis, Tremor and Weakness. Psychiatric Not Present- Insomnia.   Vitals Pulse: 68 (Regular)  Resp.: 16 (Unlabored)  BP: 138/80 (Sitting, Right Arm, Standard)   Physical Exam General Mental Status -Alert, cooperative and good historian. General  Appearance-pleasant, Not in acute distress. Orientation-Oriented X3. Build & Nutrition-Well nourished and Well developed.  Head and Neck Head-normocephalic, atraumatic . Neck Global Assessment - supple, no bruit auscultated on the right, no bruit auscultated on the left.  Eye Pupil - Bilateral-Regular and Round. Motion - Bilateral-EOMI.  Chest and Lung Exam Auscultation Breath sounds - clear at anterior chest wall and clear at posterior chest wall. Adventitious sounds - No Adventitious sounds.  Cardiovascular Auscultation Rhythm - Regular rate and rhythm. Heart Sounds - S1 WNL and S2 WNL. Murmurs & Other Heart Sounds - Auscultation of the heart reveals - No Murmurs.  Abdomen Palpation/Percussion Tenderness - Abdomen is non-tender to palpation. Rigidity (guarding) - Abdomen is soft. Auscultation Auscultation of the abdomen reveals - Bowel sounds normal.  Female Genitourinary Note: Not done, not pertinent to present illness   Musculoskeletal Note: She is a well developed female. No distress. The right knee looks excellent. There is no swelling. Range is 0-125. No tenderness or instability. The left knee shows no effusion. Range on the left is 10-110. There is moderate crepitus on range of motion. There is tenderness medial greater than lateral with no instability noted. Pulse, sensation and motor are intact.  RADIOGRAPHS: AP both knees and lateral show the prosthesis on the right is in excellent position. No periprosthetic abnormalities. On the left she has bone on bone arthritis in the medial and patellofemoral compartments.   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 Wynelle Link.  Plan is to go to [facility].  PCP - Dr. Arnoldo Morale  The patient does not have any contraindications and will receive TXA (tranexamic acid) prior to surgery. She  has been told that her sugars are running higher and she wishes to meet with a dietician while she is in the hospital.  Signed electronically by Joelene Millin, III PA-C

## 2014-08-17 ENCOUNTER — Encounter (HOSPITAL_COMMUNITY)
Admission: RE | Disposition: A | Discharge status: Skilled Nursing Facility | Payer: Self-pay | Source: Ambulatory Visit | Attending: Orthopedic Surgery

## 2014-08-17 ENCOUNTER — Encounter (HOSPITAL_COMMUNITY): Payer: Medicare Other | Admitting: Anesthesiology

## 2014-08-17 ENCOUNTER — Encounter (HOSPITAL_COMMUNITY): Payer: Self-pay | Admitting: *Deleted

## 2014-08-17 ENCOUNTER — Inpatient Hospital Stay (HOSPITAL_COMMUNITY): Payer: Medicare Other | Admitting: Anesthesiology

## 2014-08-17 ENCOUNTER — Inpatient Hospital Stay (HOSPITAL_COMMUNITY)
Admission: RE | Admit: 2014-08-17 | Discharge: 2014-08-19 | DRG: 470 | Disposition: A | Discharge status: Skilled Nursing Facility | Payer: Medicare Other | Source: Ambulatory Visit | Attending: Orthopedic Surgery | Admitting: Orthopedic Surgery

## 2014-08-17 DIAGNOSIS — M179 Osteoarthritis of knee, unspecified: Principal | ICD-10-CM | POA: Diagnosis present

## 2014-08-17 DIAGNOSIS — Z91030 Bee allergy status: Secondary | ICD-10-CM

## 2014-08-17 DIAGNOSIS — K219 Gastro-esophageal reflux disease without esophagitis: Secondary | ICD-10-CM | POA: Diagnosis present

## 2014-08-17 DIAGNOSIS — Z8619 Personal history of other infectious and parasitic diseases: Secondary | ICD-10-CM | POA: Diagnosis not present

## 2014-08-17 DIAGNOSIS — Z809 Family history of malignant neoplasm, unspecified: Secondary | ICD-10-CM | POA: Diagnosis not present

## 2014-08-17 DIAGNOSIS — Z7982 Long term (current) use of aspirin: Secondary | ICD-10-CM | POA: Diagnosis not present

## 2014-08-17 DIAGNOSIS — I10 Essential (primary) hypertension: Secondary | ICD-10-CM | POA: Diagnosis present

## 2014-08-17 DIAGNOSIS — Z79899 Other long term (current) drug therapy: Secondary | ICD-10-CM

## 2014-08-17 DIAGNOSIS — Z87440 Personal history of urinary (tract) infections: Secondary | ICD-10-CM

## 2014-08-17 DIAGNOSIS — Z9049 Acquired absence of other specified parts of digestive tract: Secondary | ICD-10-CM | POA: Diagnosis present

## 2014-08-17 DIAGNOSIS — Z791 Long term (current) use of non-steroidal anti-inflammatories (NSAID): Secondary | ICD-10-CM | POA: Diagnosis not present

## 2014-08-17 DIAGNOSIS — M1710 Unilateral primary osteoarthritis, unspecified knee: Secondary | ICD-10-CM | POA: Diagnosis present

## 2014-08-17 DIAGNOSIS — Z6841 Body Mass Index (BMI) 40.0 and over, adult: Secondary | ICD-10-CM

## 2014-08-17 DIAGNOSIS — Z96651 Presence of right artificial knee joint: Secondary | ICD-10-CM | POA: Diagnosis present

## 2014-08-17 DIAGNOSIS — Z885 Allergy status to narcotic agent status: Secondary | ICD-10-CM

## 2014-08-17 DIAGNOSIS — M25562 Pain in left knee: Secondary | ICD-10-CM | POA: Diagnosis present

## 2014-08-17 DIAGNOSIS — M25762 Osteophyte, left knee: Secondary | ICD-10-CM | POA: Diagnosis present

## 2014-08-17 DIAGNOSIS — Z8249 Family history of ischemic heart disease and other diseases of the circulatory system: Secondary | ICD-10-CM | POA: Diagnosis not present

## 2014-08-17 DIAGNOSIS — Z86010 Personal history of colonic polyps: Secondary | ICD-10-CM

## 2014-08-17 DIAGNOSIS — M1712 Unilateral primary osteoarthritis, left knee: Secondary | ICD-10-CM

## 2014-08-17 DIAGNOSIS — E785 Hyperlipidemia, unspecified: Secondary | ICD-10-CM | POA: Diagnosis present

## 2014-08-17 DIAGNOSIS — E669 Obesity, unspecified: Secondary | ICD-10-CM | POA: Diagnosis present

## 2014-08-17 HISTORY — PX: TOTAL KNEE ARTHROPLASTY: SHX125

## 2014-08-17 LAB — TYPE AND SCREEN
ABO/RH(D): A POS
Antibody Screen: NEGATIVE

## 2014-08-17 LAB — GLUCOSE, CAPILLARY: Glucose-Capillary: 106 mg/dL — ABNORMAL HIGH (ref 70–99)

## 2014-08-17 SURGERY — ARTHROPLASTY, KNEE, TOTAL
Anesthesia: Spinal | Site: Knee | Laterality: Left

## 2014-08-17 MED ORDER — POLYETHYLENE GLYCOL 3350 17 G PO PACK
17.0000 g | PACK | Freq: Every day | ORAL | Status: DC | PRN
  Administered 2014-08-19: 17 g via ORAL

## 2014-08-17 MED ORDER — DOCUSATE SODIUM 100 MG PO CAPS
100.0000 mg | ORAL_CAPSULE | Freq: Two times a day (BID) | ORAL | Status: DC
  Administered 2014-08-17 – 2014-08-19 (×4): 100 mg via ORAL

## 2014-08-17 MED ORDER — ATENOLOL-CHLORTHALIDONE 50-25 MG PO TABS: 1.0000 | ORAL_TABLET | Freq: Every day | ORAL | Status: DC

## 2014-08-17 MED ORDER — PHENOL 1.4 % MT LIQD: 1.0000 | OROMUCOSAL | Status: DC | PRN

## 2014-08-17 MED ORDER — PROPOFOL 10 MG/ML IV BOLUS
INTRAVENOUS | Status: AC
  Filled 2014-08-17: qty 20

## 2014-08-17 MED ORDER — DEXTROSE-NACL 5-0.9 % IV SOLN
INTRAVENOUS | Status: DC
  Administered 2014-08-17: 17:00:00 via INTRAVENOUS

## 2014-08-17 MED ORDER — FLEET ENEMA 7-19 GM/118ML RE ENEM: 1.0000 | ENEMA | Freq: Once | RECTAL | Status: AC | PRN

## 2014-08-17 MED ORDER — BUPIVACAINE IN DEXTROSE 0.75-8.25 % IT SOLN
INTRATHECAL | Status: DC | PRN
  Administered 2014-08-17: 2 mL via INTRATHECAL

## 2014-08-17 MED ORDER — ACETAMINOPHEN 10 MG/ML IV SOLN
1000.0000 mg | Freq: Once | INTRAVENOUS | Status: AC
  Administered 2014-08-17: 1000 mg via INTRAVENOUS
  Filled 2014-08-17: qty 100

## 2014-08-17 MED ORDER — HYDROMORPHONE HCL 2 MG PO TABS
2.0000 mg | ORAL_TABLET | ORAL | Status: DC | PRN
  Administered 2014-08-17 – 2014-08-19 (×8): 2 mg via ORAL
  Filled 2014-08-17 (×8): qty 1

## 2014-08-17 MED ORDER — MENTHOL 3 MG MT LOZG
1.0000 | LOZENGE | OROMUCOSAL | Status: DC | PRN
  Filled 2014-08-17: qty 9

## 2014-08-17 MED ORDER — METHOCARBAMOL 1000 MG/10ML IJ SOLN
500.0000 mg | Freq: Four times a day (QID) | INTRAMUSCULAR | Status: DC | PRN
  Filled 2014-08-17: qty 5

## 2014-08-17 MED ORDER — TRANEXAMIC ACID 100 MG/ML IV SOLN
1000.0000 mg | INTRAVENOUS | Status: AC
  Administered 2014-08-17: 1000 mg via INTRAVENOUS
  Filled 2014-08-17: qty 10

## 2014-08-17 MED ORDER — KETOROLAC TROMETHAMINE 15 MG/ML IJ SOLN
7.5000 mg | Freq: Four times a day (QID) | INTRAMUSCULAR | Status: AC | PRN
  Administered 2014-08-17: 7.5 mg via INTRAVENOUS

## 2014-08-17 MED ORDER — SODIUM CHLORIDE 0.9 % IV SOLN
INTRAVENOUS | Status: DC
Start: 2014-08-17 — End: 2014-08-17

## 2014-08-17 MED ORDER — LACTATED RINGERS IV SOLN
INTRAVENOUS | Status: DC
Start: 2014-08-17 — End: 2014-08-17
  Administered 2014-08-17: 1000 mL via INTRAVENOUS
  Administered 2014-08-17 (×2): via INTRAVENOUS

## 2014-08-17 MED ORDER — KETOROLAC TROMETHAMINE 15 MG/ML IJ SOLN
INTRAMUSCULAR | Status: AC
  Filled 2014-08-17: qty 1

## 2014-08-17 MED ORDER — EPHEDRINE SULFATE 50 MG/ML IJ SOLN
INTRAMUSCULAR | Status: DC | PRN
  Administered 2014-08-17 (×2): 5 mg via INTRAVENOUS

## 2014-08-17 MED ORDER — DEXAMETHASONE SODIUM PHOSPHATE 10 MG/ML IJ SOLN
10.0000 mg | Freq: Once | INTRAMUSCULAR | Status: AC
  Administered 2014-08-18: 10 mg via INTRAVENOUS
  Filled 2014-08-17: qty 1

## 2014-08-17 MED ORDER — SODIUM CHLORIDE 0.9 % IJ SOLN
INTRAMUSCULAR | Status: DC | PRN
  Administered 2014-08-17: 30 mL

## 2014-08-17 MED ORDER — ACETAMINOPHEN 500 MG PO TABS
1000.0000 mg | ORAL_TABLET | Freq: Four times a day (QID) | ORAL | Status: AC
  Administered 2014-08-17 – 2014-08-18 (×3): 1000 mg via ORAL
  Filled 2014-08-17 (×3): qty 2

## 2014-08-17 MED ORDER — BISACODYL 10 MG RE SUPP: 10.0000 mg | Freq: Every day | RECTAL | Status: DC | PRN

## 2014-08-17 MED ORDER — TRAMADOL HCL 50 MG PO TABS: 50.0000 mg | ORAL_TABLET | Freq: Four times a day (QID) | ORAL | Status: DC | PRN

## 2014-08-17 MED ORDER — CHLORHEXIDINE GLUCONATE 4 % EX LIQD: 60.0000 mL | Freq: Once | CUTANEOUS | Status: DC

## 2014-08-17 MED ORDER — BUPIVACAINE LIPOSOME 1.3 % IJ SUSP
INTRAMUSCULAR | Status: DC | PRN
  Administered 2014-08-17: 20 mL

## 2014-08-17 MED ORDER — ONDANSETRON HCL 4 MG PO TABS: 4.0000 mg | ORAL_TABLET | Freq: Four times a day (QID) | ORAL | Status: DC | PRN

## 2014-08-17 MED ORDER — BUPIVACAINE HCL 0.25 % IJ SOLN
INTRAMUSCULAR | Status: DC | PRN
  Administered 2014-08-17: 20 mL

## 2014-08-17 MED ORDER — METHOCARBAMOL 500 MG PO TABS
500.0000 mg | ORAL_TABLET | Freq: Four times a day (QID) | ORAL | Status: DC | PRN
  Administered 2014-08-18 (×2): 500 mg via ORAL
  Filled 2014-08-17 (×3): qty 1

## 2014-08-17 MED ORDER — METOCLOPRAMIDE HCL 10 MG PO TABS: 5.0000 mg | ORAL_TABLET | Freq: Three times a day (TID) | ORAL | Status: DC | PRN

## 2014-08-17 MED ORDER — DEXAMETHASONE SODIUM PHOSPHATE 10 MG/ML IJ SOLN
INTRAMUSCULAR | Status: AC
  Filled 2014-08-17: qty 1

## 2014-08-17 MED ORDER — HYDROMORPHONE HCL 1 MG/ML IJ SOLN: 0.5000 mg | INTRAMUSCULAR | Status: DC | PRN

## 2014-08-17 MED ORDER — CHLORTHALIDONE 25 MG PO TABS
25.0000 mg | ORAL_TABLET | Freq: Every day | ORAL | Status: DC
  Administered 2014-08-18 – 2014-08-19 (×2): 25 mg via ORAL
  Filled 2014-08-17 (×2): qty 1

## 2014-08-17 MED ORDER — FENTANYL CITRATE 0.05 MG/ML IJ SOLN
INTRAMUSCULAR | Status: DC | PRN
  Administered 2014-08-17: 50 ug via INTRAVENOUS

## 2014-08-17 MED ORDER — AMLODIPINE BESYLATE 5 MG PO TABS
5.0000 mg | ORAL_TABLET | Freq: Once | ORAL | Status: AC
  Administered 2014-08-17: 5 mg via ORAL
  Filled 2014-08-17: qty 1

## 2014-08-17 MED ORDER — BUPIVACAINE LIPOSOME 1.3 % IJ SUSP
20.0000 mL | Freq: Once | INTRAMUSCULAR | Status: DC
  Filled 2014-08-17: qty 20

## 2014-08-17 MED ORDER — MIDAZOLAM HCL 5 MG/5ML IJ SOLN
INTRAMUSCULAR | Status: DC | PRN
  Administered 2014-08-17: 2 mg via INTRAVENOUS

## 2014-08-17 MED ORDER — CEFAZOLIN SODIUM-DEXTROSE 2-3 GM-% IV SOLR
2.0000 g | Freq: Four times a day (QID) | INTRAVENOUS | Status: AC
  Administered 2014-08-17 – 2014-08-18 (×2): 2 g via INTRAVENOUS
  Filled 2014-08-17 (×2): qty 50

## 2014-08-17 MED ORDER — ACETAMINOPHEN 650 MG RE SUPP: 650.0000 mg | Freq: Four times a day (QID) | RECTAL | Status: DC | PRN

## 2014-08-17 MED ORDER — FENTANYL CITRATE 0.05 MG/ML IJ SOLN
INTRAMUSCULAR | Status: AC
  Filled 2014-08-17: qty 2

## 2014-08-17 MED ORDER — ATENOLOL 50 MG PO TABS
50.0000 mg | ORAL_TABLET | Freq: Every day | ORAL | Status: DC
  Administered 2014-08-18 – 2014-08-19 (×2): 50 mg via ORAL
  Filled 2014-08-17 (×2): qty 1

## 2014-08-17 MED ORDER — HYDROMORPHONE HCL 1 MG/ML IJ SOLN: 0.2500 mg | INTRAMUSCULAR | Status: DC | PRN

## 2014-08-17 MED ORDER — METOCLOPRAMIDE HCL 5 MG/ML IJ SOLN: 5.0000 mg | Freq: Three times a day (TID) | INTRAMUSCULAR | Status: DC | PRN

## 2014-08-17 MED ORDER — ATENOLOL 50 MG PO TABS
50.0000 mg | ORAL_TABLET | Freq: Once | ORAL | Status: DC
  Filled 2014-08-17: qty 1

## 2014-08-17 MED ORDER — ONDANSETRON HCL 4 MG/2ML IJ SOLN: 4.0000 mg | Freq: Four times a day (QID) | INTRAMUSCULAR | Status: DC | PRN

## 2014-08-17 MED ORDER — DEXAMETHASONE SODIUM PHOSPHATE 10 MG/ML IJ SOLN
10.0000 mg | Freq: Once | INTRAMUSCULAR | Status: AC
  Administered 2014-08-17: 10 mg via INTRAVENOUS

## 2014-08-17 MED ORDER — RIVAROXABAN 10 MG PO TABS
10.0000 mg | ORAL_TABLET | Freq: Every day | ORAL | Status: DC
  Administered 2014-08-18 – 2014-08-19 (×2): 10 mg via ORAL
  Filled 2014-08-17 (×3): qty 1

## 2014-08-17 MED ORDER — ONDANSETRON HCL 4 MG/2ML IJ SOLN
INTRAMUSCULAR | Status: AC
  Filled 2014-08-17: qty 2

## 2014-08-17 MED ORDER — MIDAZOLAM HCL 2 MG/2ML IJ SOLN
INTRAMUSCULAR | Status: AC
  Filled 2014-08-17: qty 2

## 2014-08-17 MED ORDER — BUPIVACAINE HCL (PF) 0.25 % IJ SOLN
INTRAMUSCULAR | Status: AC
  Filled 2014-08-17: qty 30

## 2014-08-17 MED ORDER — DIPHENHYDRAMINE HCL 12.5 MG/5ML PO ELIX: 12.5000 mg | ORAL_SOLUTION | ORAL | Status: DC | PRN

## 2014-08-17 MED ORDER — ONDANSETRON HCL 4 MG/2ML IJ SOLN
INTRAMUSCULAR | Status: DC | PRN
  Administered 2014-08-17: 4 mg via INTRAVENOUS

## 2014-08-17 MED ORDER — FAMOTIDINE 20 MG PO TABS
20.0000 mg | ORAL_TABLET | ORAL | Status: DC
  Administered 2014-08-18 – 2014-08-19 (×2): 20 mg via ORAL
  Filled 2014-08-17 (×2): qty 1

## 2014-08-17 MED ORDER — PROPOFOL INFUSION 10 MG/ML OPTIME
INTRAVENOUS | Status: DC | PRN
  Administered 2014-08-17: 25 ug/kg/min via INTRAVENOUS

## 2014-08-17 MED ORDER — ACETAMINOPHEN 325 MG PO TABS: 650.0000 mg | ORAL_TABLET | Freq: Four times a day (QID) | ORAL | Status: DC | PRN

## 2014-08-17 MED ORDER — LACTATED RINGERS IV SOLN
INTRAVENOUS | Status: DC
Start: 2014-08-17 — End: 2014-08-17

## 2014-08-17 MED ORDER — SODIUM CHLORIDE 0.9 % IJ SOLN
INTRAMUSCULAR | Status: AC
  Filled 2014-08-17: qty 50

## 2014-08-17 SURGICAL SUPPLY — 61 items
BAG SPEC THK2 15X12 ZIP CLS (MISCELLANEOUS) ×1
BAG ZIPLOCK 12X15 (MISCELLANEOUS) ×2 IMPLANT
BANDAGE ELASTIC 6 VELCRO ST LF (GAUZE/BANDAGES/DRESSINGS) ×2 IMPLANT
BANDAGE ESMARK 6X9 LF (GAUZE/BANDAGES/DRESSINGS) ×1 IMPLANT
BLADE SAG 18X100X1.27 (BLADE) ×2 IMPLANT
BLADE SAW SGTL 11.0X1.19X90.0M (BLADE) ×2 IMPLANT
BNDG CMPR 9X6 STRL LF SNTH (GAUZE/BANDAGES/DRESSINGS) ×1
BNDG ESMARK 6X9 LF (GAUZE/BANDAGES/DRESSINGS) ×2
BOWL SMART MIX CTS (DISPOSABLE) ×2 IMPLANT
CAPT RP KNEE ×1 IMPLANT
CEMENT HV SMART SET (Cement) ×4 IMPLANT
CUFF TOURN SGL QUICK 34 (TOURNIQUET CUFF) ×2
CUFF TRNQT CYL 34X4X40X1 (TOURNIQUET CUFF) ×1 IMPLANT
DECANTER SPIKE VIAL GLASS SM (MISCELLANEOUS) ×2 IMPLANT
DRAPE EXTREMITY TIBURON (DRAPES) ×2 IMPLANT
DRAPE POUCH INSTRU U-SHP 10X18 (DRAPES) ×2 IMPLANT
DRAPE U-SHAPE 47X51 STRL (DRAPES) ×2 IMPLANT
DRSG ADAPTIC 3X8 NADH LF (GAUZE/BANDAGES/DRESSINGS) ×2 IMPLANT
DRSG PAD ABDOMINAL 8X10 ST (GAUZE/BANDAGES/DRESSINGS) ×1 IMPLANT
DURAPREP 26ML APPLICATOR (WOUND CARE) ×2 IMPLANT
ELECT REM PT RETURN 9FT ADLT (ELECTROSURGICAL) ×2
ELECTRODE REM PT RTRN 9FT ADLT (ELECTROSURGICAL) ×1 IMPLANT
EVACUATOR 1/8 PVC DRAIN (DRAIN) ×2 IMPLANT
FACESHIELD WRAPAROUND (MASK) ×10 IMPLANT
FACESHIELD WRAPAROUND OR TEAM (MASK) ×5 IMPLANT
GAUZE SPONGE 4X4 12PLY STRL (GAUZE/BANDAGES/DRESSINGS) ×2 IMPLANT
GLOVE BIO SURGEON STRL SZ7.5 (GLOVE) IMPLANT
GLOVE BIO SURGEON STRL SZ8 (GLOVE) ×2 IMPLANT
GLOVE BIOGEL PI IND STRL 6.5 (GLOVE) IMPLANT
GLOVE BIOGEL PI IND STRL 8 (GLOVE) ×1 IMPLANT
GLOVE BIOGEL PI INDICATOR 6.5 (GLOVE)
GLOVE BIOGEL PI INDICATOR 8 (GLOVE) ×1
GLOVE SURG SS PI 6.5 STRL IVOR (GLOVE) IMPLANT
GOWN STRL REUS W/TWL LRG LVL3 (GOWN DISPOSABLE) ×2 IMPLANT
GOWN STRL REUS W/TWL XL LVL3 (GOWN DISPOSABLE) IMPLANT
HANDPIECE INTERPULSE COAX TIP (DISPOSABLE) ×2
IMMOBILIZER KNEE 20 (SOFTGOODS) ×1 IMPLANT
IMMOBILIZER KNEE 20 THIGH 36 (SOFTGOODS) ×1 IMPLANT
KIT BASIN OR (CUSTOM PROCEDURE TRAY) ×2 IMPLANT
MANIFOLD NEPTUNE II (INSTRUMENTS) ×2 IMPLANT
NDL SAFETY ECLIPSE 18X1.5 (NEEDLE) ×2 IMPLANT
NEEDLE HYPO 18GX1.5 SHARP (NEEDLE) ×4
NS IRRIG 1000ML POUR BTL (IV SOLUTION) ×2 IMPLANT
PACK TOTAL JOINT (CUSTOM PROCEDURE TRAY) ×2 IMPLANT
PAD ABD 8X10 STRL (GAUZE/BANDAGES/DRESSINGS) ×1 IMPLANT
PADDING CAST COTTON 6X4 STRL (CAST SUPPLIES) ×3 IMPLANT
POSITIONER SURGICAL ARM (MISCELLANEOUS) ×2 IMPLANT
SET HNDPC FAN SPRY TIP SCT (DISPOSABLE) ×1 IMPLANT
STRIP CLOSURE SKIN 1/2X4 (GAUZE/BANDAGES/DRESSINGS) ×3 IMPLANT
SUCTION FRAZIER 12FR DISP (SUCTIONS) ×2 IMPLANT
SUT MNCRL AB 4-0 PS2 18 (SUTURE) ×2 IMPLANT
SUT VIC AB 2-0 CT1 27 (SUTURE) ×6
SUT VIC AB 2-0 CT1 TAPERPNT 27 (SUTURE) ×3 IMPLANT
SUT VLOC 180 0 24IN GS25 (SUTURE) ×2 IMPLANT
SYR 20CC LL (SYRINGE) ×2 IMPLANT
SYR 50ML LL SCALE MARK (SYRINGE) ×2 IMPLANT
TOWEL OR 17X26 10 PK STRL BLUE (TOWEL DISPOSABLE) ×2 IMPLANT
TOWEL OR NON WOVEN STRL DISP B (DISPOSABLE) IMPLANT
TRAY FOLEY CATH 14FRSI W/METER (CATHETERS) ×2 IMPLANT
WATER STERILE IRR 1500ML POUR (IV SOLUTION) ×2 IMPLANT
WRAP KNEE MAXI GEL POST OP (GAUZE/BANDAGES/DRESSINGS) ×2 IMPLANT

## 2014-08-17 NOTE — Interval H&P Note (Signed)
History and Physical Interval Note:  08/17/2014 12:39 PM  [name]  has presented today for surgery, with the diagnosis of Left Knee osteoarthritis  The various methods of treatment have been discussed with the patient and family. After consideration of risks, benefits and other options for treatment, the patient has consented to  Procedure(s): LEFT TOTAL KNEE ARTHROPLASTY (Left) as a surgical intervention .  The patient's history has been reviewed, patient examined, no change in status, stable for surgery.  I have reviewed the patient's chart and labs.  Questions were answered to the patient's satisfaction.     [name]

## 2014-08-17 NOTE — Anesthesia Procedure Notes (Signed)
Spinal  Patient location during procedure: OR Start time: 08/17/2014 12:49 PM End time: 08/17/2014 1:00 PM Staffing CRNA/Resident: Darlys Gales R Performed by: anesthesiologist and resident/CRNA  Preanesthetic Checklist Completed: patient identified, site marked, surgical consent, pre-op evaluation, timeout performed, IV checked, risks and benefits discussed and monitors and equipment checked Spinal Block Patient position: sitting Prep: Betadine Patient monitoring: heart rate, cardiac monitor, continuous pulse ox and blood pressure Approach: midline Location: L2-3 Injection technique: single-shot Needle Needle type: Spinocan  Needle gauge: 22 G Needle length: 9 cm Needle insertion depth: 8 cm Assessment Sensory level: T4

## 2014-08-17 NOTE — H&P (View-Only) (Signed)
Preoperative surgical orders have been place into the Epic hospital system for [facility] on 07/16/2014, 1:55 PM  by Mickel Crow for surgery on 08/17/2014.  Preop Total Knee orders including Experal, IV Tylenol, and IV Decadron as long as there are no contraindications to the above medications. Arlee Muslim, PA-C

## 2014-08-17 NOTE — Transfer of Care (Signed)
Immediate Anesthesia Transfer of Care Note  Patient: [name]  Procedure(s) Performed: Procedure(s) (LRB): LEFT TOTAL KNEE ARTHROPLASTY (Left)  Patient Location: PACU  Anesthesia Type: Spinal  Level of Consciousness: sedated, patient cooperative and responds to stimulation  Airway & Oxygen Therapy: Patient Spontanous Breathing and Patient connected to face mask oxgen  Post-op Assessment: Report given to PACU RN and Post -op Vital signs reviewed and stable  Post vital signs: Reviewed and stable  Complications: No apparent anesthesia complications

## 2014-08-17 NOTE — Progress Notes (Signed)
Clinical Social Work Department BRIEF PSYCHOSOCIAL ASSESSMENT 08/17/2014  Patient:  [name],[name]     Account Number:  [id number]     Admit date:  08/17/2014  Clinical Social Worker:  GERBER,HOLLY, LCSW  Date/Time:  08/17/2014 04:00 PM  Referred by:  Physician  Date Referred:  08/17/2014 Referred for  SNF Placement   Other Referral:   Interview type:  Patient Other interview type:    PSYCHOSOCIAL DATA Living Status:  FAMILY Admitted from facility:   Level of care:   Primary support name:  Shelley Primary support relationship to patient:  SPOUSE Degree of support available:   Strong    CURRENT CONCERNS Current Concerns  Post-Acute Placement   Other Concerns:    SOCIAL WORK ASSESSMENT / PLAN CSW received referral to assist with DC planning. CSW reviewed chart and met with patient and family at bedside. Patient agreeable for family to be involved during assessment.    Patient reports that surgery was planned and she spoke with MD re: DC plans. Patient reports that MD recommended [facility] and that is where she chose to complete rehab at DC. CSW explained process and CSW role. Patient and family agreeable to [facility] and thanked CSW for assistance.    CSW completed FL2 and faxed information to [facility]. CSW left a message with SNF in order to ensure patient has pre-registered at SNF.   Assessment/plan status:  Psychosocial Support/Ongoing Assessment of Needs Other assessment/ plan:   Information/referral to community resources:   SNF list    PATIENT'S/FAMILY'S RESPONSE TO PLAN OF CARE: Patient alert and oriented. Patient and family understanding of rehab needs and are happy that they have already chosen a facility. Patient reports she is hopeful to rehab quickly so that she can return home and be around her family. Patient thanked CSW for time and agreeable to further follow up.       Holly Gerber, LCSW (Coverage for [facility]) 

## 2014-08-17 NOTE — Plan of Care (Signed)
Problem: Consults Goal: Diagnosis- Total Joint Replacement Primary Total Knee     

## 2014-08-17 NOTE — Progress Notes (Addendum)
Clinical Social Work Department CLINICAL SOCIAL WORK PLACEMENT NOTE 08/17/2014  Patient:  [name],Cheryl  Account Number:  [id number] Admit date:  08/17/2014  Clinical Social Worker:  Sindy Messing, LCSW  Date/time:  08/17/2014 04:00 PM  Clinical Social Work is seeking post-discharge placement for this patient at the following level of care:   [place]   (*CSW will update this form in Epic as items are completed)   08/17/2014  Patient/family provided with [place] Department of Clinical Social Work's list of facilities offering this level of care within the geographic area requested by the patient (or if unable, by the patient's family).  08/17/2014  Patient/family informed of their freedom to choose among providers that offer the needed level of care, that participate in Medicare, Medicaid or managed care program needed by the patient, have an available bed and are willing to accept the patient.  08/17/2014  Patient/family informed of [facility]' ownership interest in [facility], as well as of the fact that they are under no obligation to receive care at this facility.  PASARR submitted to EDS on 08/17/2014 PASARR number received on 08/17/2014  FL2 transmitted to all facilities in geographic area requested by pt/family on  08/17/2014 FL2 transmitted to all facilities within larger geographic area on   Patient informed that his/her managed care company has contracts with or will negotiate with  certain facilities, including the following:     Patient/family informed of bed offers received:  08/18/2014 Patient chooses bed at [facility] Physician recommends and patient chooses bed at    Patient to be transferred to  on  [facility] on 08/19/2014 Patient to be transferred to facility by pt husband via private vehicle Patient and family notified of transfer on 08/19/2014 Name of family member notified:  Pt notified at bedside  The following physician request  were entered in Epic:   Additional Comments:   Alison Murray, MSW, [place] [facility] [place], [place]

## 2014-08-17 NOTE — Anesthesia Postprocedure Evaluation (Signed)
  Anesthesia Post-op Note  Patient: [name]  Procedure(s) Performed: Procedure(s) (LRB): LEFT TOTAL KNEE ARTHROPLASTY (Left)  Patient Location: PACU  Anesthesia Type: Spinal  Level of Consciousness: awake and alert   Airway and Oxygen Therapy: Patient Spontanous Breathing  Post-op Pain: mild  Post-op Assessment: Post-op Vital signs reviewed, Patient's Cardiovascular Status Stable, Respiratory Function Stable, Patent Airway and No signs of Nausea or vomiting  Last Vitals:  Filed Vitals:   08/17/14 1527  BP: 128/96  Pulse: 60  Temp: 36.6 C  Resp: 15    Post-op Vital Signs: stable   Complications: No apparent anesthesia complications

## 2014-08-17 NOTE — Progress Notes (Signed)
Utilization review completed.  

## 2014-08-17 NOTE — H&P (View-Only) (Signed)
[name] DOB: 10-08-1941 Married / Language: Cleophus Molt / Race: Black or African American Female Date of Admission:  08/17/2014 Chief Complaint:  Left Knee Pain History of Present Illness The patient is a 73 year old female who comes in for a preoperative History and Physical. The patient is scheduled for a left total knee arthroplasty to be performed by Dr. Dione Plover. Aluisio, MD at [facility] on 08-17-2014. The patient is a 73 year old female who presents for follow up of their knee. The patient is being followed for their bilateral knee pain. They are now over 2 years out from right total knee. The patient feels that they are doing poorly with the left knee thoug and reports their pain level to be moderate. The patient has reported improvement of their symptoms with: activity modification. She is using a cane for support. Her right knee is doing very well. The left knee is getting progressively worse. It is as bad as the right one was prior to when she had surgery on the right. She states it hurts at all times. It is limiting what she can and can not do. She has had cortisone and visco supplements in the past without benefit. She is real pleased with how the right knee has done and wants to get something done about the left side. She states she has had no pain with it and she is getting around very well on the right. The most predictable means of improving her pain and function at this time is a total knee arthroplasty. We discussed this in detail. I told her about the differences now as compared to when she had the other one two years ago with the use of EXPAREL and knee rapid therapy. She is now ready to proceed with the left knee repalcement at this time. They have been treated conservatively in the past for the above stated problem and despite conservative measures, they continue to have progressive pain and severe functional limitations and dysfunction. They have failed non-operative  management including home exercise, medications, and injections. It is felt that they would benefit from undergoing total joint replacement. Risks and benefits of the procedure have been discussed with the patient and they elect to proceed with surgery. There are no active contraindications to surgery such as ongoing infection or rapidly progressive neurological disease.  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 mother and father  Social History Current work status working part time Never consumed alcohol 04/07/2014: Never consumed alcohol No history of drug/alcohol rehab Marital status Married. married Exercise Exercises weekly; does running / walking Exercises rarely Living situation Lives with spouse. live with spouse Children 6 5 or more Alcohol use never consumed alcohol Tobacco / smoke exposure 04/07/2014: yes no No alcohol use Tobacco use Never smoker. 09/32/6712 never smoker Illicit drug use no Post-Surgical Plans Plan is to go to rehab. Pain Contract no Not under pain contract Drug/Alcohol Rehab (Currently) no Drug/Alcohol Rehab (Previously) no  Medication History  Aleve (220MG  Capsule, Oral)  Active. Aspirin Adult Low Strength (81MG  Tablet DR, Oral) Active. Crestor (20MG  Tablet, Oral) Active. Atenolol (Oral) Specific dose unknown - Active. Lotrel (Oral) Specific dose unknown - Active.  Past Surgical History Appendectomy S/P partial hysterectomy (V88.02) Breast Duct Resection Benign S/P partial resection of colon (V45.89) No pertinent past surgical history Hx of oophorectomy (V45.77) Skin Graft Left Arm - Traumatic Compression Injury Left Upper Arm   Review of Systems General Not Present- Chills, Fatigue, Fever, Memory Loss, Night Sweats, Weight Gain and Weight Loss. Skin Not Present- Eczema, Hives, Itching, Lesions and Rash. HEENT Not Present- Dentures, Double Vision, Headache, Hearing Loss, Tinnitus and Visual Loss. Respiratory Not Present- Allergies, Chronic Cough, Cough, Coughing up blood, Shortness of breath at rest and Shortness of breath with exertion. Cardiovascular Present- Palpitations (Ocassional Plapitations). Not Present- Chest Pain, Difficulty Breathing Lying Down, Murmur, Racing/skipping heartbeats and Swelling. Gastrointestinal Not Present- Abdominal Pain, Bloody Stool, Constipation, Diarrhea, Difficulty Swallowing, Heartburn, Jaundice, Loss of appetitie, Nausea and Vomiting. Female Genitourinary Not Present- Blood in Urine, Discharge, Flank Pain, Incontinence, Painful Urination, Urgency, Urinary frequency, Urinary Retention, Urinating at Night and Weak urinary stream. Musculoskeletal Present- Joint Pain. Not Present- Back Pain, Joint Swelling, Morning Stiffness, Muscle Pain, Muscle Weakness and Spasms. Neurological Not Present- Blackout spells, Difficulty with balance, Dizziness, Paralysis, Tremor and Weakness. Psychiatric Not Present- Insomnia.   Vitals Pulse: 68 (Regular)  Resp.: 16 (Unlabored)  BP: 138/80 (Sitting, Right Arm, Standard)   Physical Exam General Mental Status -Alert, cooperative and good historian. General  Appearance-pleasant, Not in acute distress. Orientation-Oriented X3. Build & Nutrition-Well nourished and Well developed.  Head and Neck Head-normocephalic, atraumatic . Neck Global Assessment - supple, no bruit auscultated on the right, no bruit auscultated on the left.  Eye Pupil - Bilateral-Regular and Round. Motion - Bilateral-EOMI.  Chest and Lung Exam Auscultation Breath sounds - clear at anterior chest wall and clear at posterior chest wall. Adventitious sounds - No Adventitious sounds.  Cardiovascular Auscultation Rhythm - Regular rate and rhythm. Heart Sounds - S1 WNL and S2 WNL. Murmurs & Other Heart Sounds - Auscultation of the heart reveals - No Murmurs.  Abdomen Palpation/Percussion Tenderness - Abdomen is non-tender to palpation. Rigidity (guarding) - Abdomen is soft. Auscultation Auscultation of the abdomen reveals - Bowel sounds normal.  Female Genitourinary Note: Not done, not pertinent to present illness   Musculoskeletal Note: She is a well developed female. No distress. The right knee looks excellent. There is no swelling. Range is 0-125. No tenderness or instability. The left knee shows no effusion. Range on the left is 10-110. There is moderate crepitus on range of motion. There is tenderness medial greater than lateral with no instability noted. Pulse, sensation and motor are intact.  RADIOGRAPHS: AP both knees and lateral show the prosthesis on the right is in excellent position. No periprosthetic abnormalities. On the left she has bone on bone arthritis in the medial and patellofemoral compartments.   Assessment & Plan (Latonga Ponder L. Sahra Converse III PA-C; 08/06/2014 10:46 AM) Osteoarthritis of left knee (M17.9) Note:Plan is for a Left Total Knee Replacement by Dr. Wynelle Link.  Plan is to go to [facility].  PCP - Dr. Arnoldo Morale  The patient does not have any contraindications and will receive TXA (tranexamic acid) prior to surgery. She  has been told that her sugars are running higher and she wishes to meet with a dietician while she is in the hospital.  Signed electronically by Joelene Millin, III PA-C

## 2014-08-17 NOTE — Op Note (Signed)
Pre-operative diagnosis- Osteoarthritis  Left knee(s)  Post-operative diagnosis- Osteoarthritis Left knee(s)  Procedure-  Left  Total Knee Arthroplasty  Surgeon- Dione Plover. Jaxten Brosh, MD  Assistant- Arlee Muslim, PA-C   Anesthesia-  Spinal  EBL-* No blood loss amount entered *   Drains Hemovac  Tourniquet time-  Total Tourniquet Time Documented: Thigh (Left) - 30 minutes Total: Thigh (Left) - 30 minutes     Complications- None  Condition-PACU - hemodynamically stable.   Brief Clinical Note  [name] is a 73 y.o. year old female with end stage OA of her left knee with progressively worsening pain and dysfunction. She has constant pain, with activity and at rest and significant functional deficits with difficulties even with ADLs. She has had extensive non-op management including analgesics, injections of cortisone, and home exercise program, but remains in significant pain with significant dysfunction. Radiographs show bone on bone arthritis medial and patellofemoral. She presents now for left Total Knee Arthroplasty.    Procedure in detail---   The patient is brought into the operating room and positioned supine on the operating table. After successful administration of  Spinal,   a tourniquet is placed high on the  Left thigh(s) and the lower extremity is prepped and draped in the usual sterile fashion. Time out is performed by the operating team and then the  Left lower extremity is wrapped in Esmarch, knee flexed and the tourniquet inflated to 300 mmHg.       A midline incision is made with a ten blade through the subcutaneous tissue to the level of the extensor mechanism. A fresh blade is used to make a medial parapatellar arthrotomy. Soft tissue over the proximal medial tibia is subperiosteally elevated to the joint line with a knife and into the semimembranosus bursa with a Cobb elevator. Soft tissue over the proximal lateral tibia is elevated with attention being paid to  avoiding the patellar tendon on the tibial tubercle. The patella is everted, knee flexed 90 degrees and the ACL and PCL are removed. Findings are bone on bone medial and patellofemoral with massive global osteophytes.        The drill is used to create a starting hole in the distal femur and the canal is thoroughly irrigated with sterile saline to remove the fatty contents. The 5 degree Left  valgus alignment guide is placed into the femoral canal and the distal femoral cutting block is pinned to remove 10 mm off the distal femur. Resection is made with an oscillating saw.      The tibia is subluxed forward and the menisci are removed. The extramedullary alignment guide is placed referencing proximally at the medial aspect of the tibial tubercle and distally along the second metatarsal axis and tibial crest. The block is pinned to remove 35mm off the more deficient medial  side. Resection is made with an oscillating saw. Size 4is the most appropriate size for the tibia and the proximal tibia is prepared with the modular drill and keel punch for that size.      The femoral sizing guide is placed and size 4 is most appropriate. Rotation is marked off the epicondylar axis and confirmed by creating a rectangular flexion gap at 90 degrees. The size 4 cutting block is pinned in this rotation and the anterior, posterior and chamfer cuts are made with the oscillating saw. The intercondylar block is then placed and that cut is made.      Trial size 4 tibial component, trial size 4  narrow posterior stabilized femur and a 12.5  mm posterior stabilized rotating platform insert trial is placed. Full extension is achieved with excellent varus/valgus and anterior/posterior balance throughout full range of motion. The patella is everted and thickness measured to be 24  mm. Free hand resection is taken to 14 mm, a 38 template is placed, lug holes are drilled, trial patella is placed, and it tracks normally. Osteophytes are  removed off the posterior femur with the trial in place. All trials are removed and the cut bone surfaces prepared with pulsatile lavage. Cement is mixed and once ready for implantation, the size 4 tibial implant, size  4 narrow posterior stabilized femoral component, and the size 38 patella are cemented in place and the patella is held with the clamp. The trial insert is placed and the knee held in full extension. The Exparel (20 ml mixed with 30 ml saline) and .25% Bupivicaine, are injected into the extensor mechanism, posterior capsule, medial and lateral gutters and subcutaneous tissues.  All extruded cement is removed and once the cement is hard the permanent 12.5 mm posterior stabilized rotating platform insert is placed into the tibial tray.      The wound is copiously irrigated with saline solution and the extensor mechanism closed over a hemovac drain with #1 V-loc suture. The tourniquet is released for a total tourniquet time of 30  minutes. Flexion against gravity is 135 degrees and the patella tracks normally. Subcutaneous tissue is closed with 2.0 vicryl and subcuticular with running 4.0 Monocryl. The incision is cleaned and dried and steri-strips and a bulky sterile dressing are applied. The limb is placed into a knee immobilizer and the patient is awakened and transported to recovery in stable condition.      Please note that a surgical assistant was a medical necessity for this procedure in order to perform it in a safe and expeditious manner. Surgical assistant was necessary to retract the ligaments and vital neurovascular structures to prevent injury to them and also necessary for proper positioning of the limb to allow for anatomic placement of the prosthesis.   Dione Plover Bianco Cange, MD    08/17/2014, 1:49 PM

## 2014-08-18 LAB — BASIC METABOLIC PANEL
Anion gap: 11 (ref 5–15)
BUN: 24 mg/dL — ABNORMAL HIGH (ref 6–23)
CO2: 24 mEq/L (ref 19–32)
Calcium: 8.6 mg/dL (ref 8.4–10.5)
Chloride: 101 mEq/L (ref 96–112)
Creatinine, Ser: 1.17 mg/dL — ABNORMAL HIGH (ref 0.50–1.10)
GFR calc Af Amer: 53 mL/min — ABNORMAL LOW (ref >90)
GFR calc non Af Amer: 45 mL/min — ABNORMAL LOW (ref >90)
Glucose, Bld: 189 mg/dL — ABNORMAL HIGH (ref 70–99)
Potassium: 4.2 mEq/L (ref 3.7–5.3)
Sodium: 136 mEq/L — ABNORMAL LOW (ref 137–147)

## 2014-08-18 LAB — CBC
HCT: 36.4 % (ref 36.0–46.0)
Hemoglobin: 12.5 g/dL (ref 12.0–15.0)
MCH: 29.5 pg (ref 26.0–34.0)
MCHC: 34.3 g/dL (ref 30.0–36.0)
MCV: 85.8 fL (ref 78.0–100.0)
Platelets: 219 10*3/uL (ref 150–400)
RBC: 4.24 MIL/uL (ref 3.87–5.11)
RDW: 12.5 % (ref 11.5–15.5)
WBC: 18 10*3/uL — ABNORMAL HIGH (ref 4.0–10.5)

## 2014-08-18 MED ORDER — LIP MEDEX EX OINT
TOPICAL_OINTMENT | CUTANEOUS | Status: AC
  Filled 2014-08-18: qty 7

## 2014-08-18 MED ORDER — METOCLOPRAMIDE HCL 5 MG PO TABS: 5.0000 mg | ORAL_TABLET | Freq: Three times a day (TID) | ORAL | Status: DC | PRN

## 2014-08-18 MED ORDER — BISACODYL 10 MG RE SUPP: 10.0000 mg | Freq: Every day | RECTAL | Status: DC | PRN

## 2014-08-18 MED ORDER — HYDROMORPHONE HCL 2 MG PO TABS: 2.0000 mg | ORAL_TABLET | ORAL | Status: DC | PRN

## 2014-08-18 MED ORDER — ACETAMINOPHEN 325 MG PO TABS: 650.0000 mg | ORAL_TABLET | Freq: Four times a day (QID) | ORAL | Status: DC | PRN

## 2014-08-18 MED ORDER — DSS 100 MG PO CAPS: 100.0000 mg | ORAL_CAPSULE | Freq: Two times a day (BID) | ORAL | Status: DC

## 2014-08-18 MED ORDER — BENAZEPRIL HCL 20 MG PO TABS
20.0000 mg | ORAL_TABLET | Freq: Every day | ORAL | Status: DC
  Administered 2014-08-18 – 2014-08-19 (×2): 20 mg via ORAL
  Filled 2014-08-18 (×2): qty 1

## 2014-08-18 MED ORDER — AMLODIPINE BESYLATE 5 MG PO TABS
5.0000 mg | ORAL_TABLET | Freq: Every day | ORAL | Status: DC
  Administered 2014-08-18 – 2014-08-19 (×2): 5 mg via ORAL
  Filled 2014-08-18 (×2): qty 1

## 2014-08-18 MED ORDER — POLYETHYLENE GLYCOL 3350 17 G PO PACK: 17.0000 g | PACK | Freq: Every day | ORAL | Status: DC | PRN

## 2014-08-18 MED ORDER — RIVAROXABAN 10 MG PO TABS: 10.0000 mg | ORAL_TABLET | Freq: Every day | ORAL | Status: DC

## 2014-08-18 MED ORDER — AMLODIPINE BESY-BENAZEPRIL HCL 5-20 MG PO CAPS: 1.0000 | ORAL_CAPSULE | Freq: Every day | ORAL | Status: DC

## 2014-08-18 MED ORDER — METHOCARBAMOL 500 MG PO TABS: 500.0000 mg | ORAL_TABLET | Freq: Four times a day (QID) | ORAL | Status: DC | PRN

## 2014-08-18 MED ORDER — ONDANSETRON HCL 4 MG PO TABS: 4.0000 mg | ORAL_TABLET | Freq: Four times a day (QID) | ORAL | Status: DC | PRN

## 2014-08-18 MED ORDER — TRAMADOL HCL 50 MG PO TABS: 50.0000 mg | ORAL_TABLET | Freq: Four times a day (QID) | ORAL | Status: DC | PRN

## 2014-08-18 NOTE — Progress Notes (Signed)
Clinical Social Work   Patient was discussed during progression meeting and patient is not medically stable to DC. CSW updated [facility] who remains agreeable to accept patient at DC. CSW will continue to follow.   Earlie Server (Coverage for [employer])

## 2014-08-18 NOTE — Care Management Note (Signed)
    Page 1 of 1   08/18/2014     12:25:20 PM CARE MANAGEMENT NOTE 08/18/2014  Patient:  [name],[name]   Account Number:  [id number]  Date Initiated:  08/18/2014  Documentation initiated by:  [facility]  Subjective/Objective Assessment:   BWL:SLHT TOTAL KNEE ARTHROPLASTY (Left)     Action/Plan:   discharge planning   Anticipated DC Date:  08/19/2014   Anticipated DC Plan:  [place]  CM consult      Choice offered to / List presented to:             Status of service:  Completed, signed off Medicare Important Message given?   (If response is "NO", the following Medicare IM given date fields will be blank) Date Medicare IM given:   Medicare IM given by:   Date Additional Medicare IM given:   Additional Medicare IM given by:    Discharge Disposition:  [place]  Per UR Regulation:    If discussed at Long Length of Stay Meetings, dates discussed:    Comments:  08/18/14 12:20 CM notes pt to go to SNF for rehab; CSW arranging.  No other CM needs were communicated.  Mariane Masters, BSN, CM [phone].

## 2014-08-18 NOTE — Discharge Summary (Signed)
Physician Discharge Summary   Patient ID: [name] MRN: [id number] DOB/AGE: May 28, 1941 73 y.o.  Admit date: 08/17/2014 Discharge date: 08/19/2014  Primary Diagnosis:  Osteoarthritis Left knee(s)  Admission Diagnoses:  Past Medical History  Diagnosis Date  . Hyperlipidemia   . Hypertension   . Obesity   . Diverticulitis hx of  . Diverticulosis   . Abdominal pain   . Constipation   . Colon adenoma   . GERD (gastroesophageal reflux disease)   . Pneumonia 2005  . Numbness and tingling in right hand     pt. states has numbness of right hand very frequently-watch positioning  . Personal history of colonic polyps-adenoma 08/26/2008  . H/O hiatal hernia   . Frequent UTI     hx of urethral injury during colon surgery - states frequent uti's since  . History of shingles     has a lingering itching on back where shingles were  . Pain     pain left knee and pain right hip and right groin  . Osteoporosis   . History of palpitations     in the past  . Arthritis     cervical disc degeneration/ oa left knee, carpal tunnel rt wrist, adhesive capsulitis right shoulder, rt hand weakness; lumbar degeneration  . Complication of anesthesia 2006-at [facility]    breathing problems-no BP med given prior to surgery;  hx of being very sleepy after colon surgery --  states no problems with last right total knee replacement 2013  . Diabetes mellitus without complication     borderline - diet control   Discharge Diagnoses:   Principal Problem:   OA (osteoarthritis) of knee  Estimated body mass index is 42.75 kg/(m^2) as calculated from the following:   Height as of this encounter: '5\' 7"'  (1.702 m).   Weight as of this encounter: 123.832 kg (273 lb).  Procedure:  Procedure(s) (LRB): LEFT TOTAL KNEE ARTHROPLASTY (Left)   Consults: None  HPI: [name] is a 73 y.o. year old female with end stage OA of her left knee with progressively worsening pain and dysfunction. She has constant  pain, with activity and at rest and significant functional deficits with difficulties even with ADLs. She has had extensive non-op management including analgesics, injections of cortisone, and home exercise program, but remains in significant pain with significant dysfunction. Radiographs show bone on bone arthritis medial and patellofemoral. She presents now for left Total Knee Arthroplasty.   Laboratory Data: Admission on 08/17/2014  Component Date Value Ref Range Status  . Glucose-Capillary 08/17/2014 106* 70 - 99 mg/dL Final  . WBC 08/18/2014 18.0* 4.0 - 10.5 K/uL Final  . RBC 08/18/2014 4.24  3.87 - 5.11 MIL/uL Final  . Hemoglobin 08/18/2014 12.5  12.0 - 15.0 g/dL Final  . HCT 08/18/2014 36.4  36.0 - 46.0 % Final  . MCV 08/18/2014 85.8  78.0 - 100.0 fL Final  . MCH 08/18/2014 29.5  26.0 - 34.0 pg Final  . MCHC 08/18/2014 34.3  30.0 - 36.0 g/dL Final  . RDW 08/18/2014 12.5  11.5 - 15.5 % Final  . Platelets 08/18/2014 219  150 - 400 K/uL Final  . Sodium 08/18/2014 136* 137 - 147 mEq/L Final  . Potassium 08/18/2014 4.2  3.7 - 5.3 mEq/L Final  . Chloride 08/18/2014 101  96 - 112 mEq/L Final  . CO2 08/18/2014 24  19 - 32 mEq/L Final  . Glucose, Bld 08/18/2014 189* 70 - 99 mg/dL Final  . BUN 08/18/2014 24* 6 -  23 mg/dL Final  . Creatinine, Ser 08/18/2014 1.17* 0.50 - 1.10 mg/dL Final  . Calcium 08/18/2014 8.6  8.4 - 10.5 mg/dL Final  . GFR calc non Af Amer 08/18/2014 45* >90 mL/min Final  . GFR calc Af Amer 08/18/2014 53* >90 mL/min Final   Comment: (NOTE)                          The eGFR has been calculated using the CKD EPI equation.                          This calculation has not been validated in all clinical situations.                          eGFR's persistently <90 mL/min signify possible Chronic Kidney                          Disease.  Georgiann Hahn gap 08/18/2014 11  5 - 15 Final  Hospital Outpatient Visit on 08/11/2014  Component Date Value Ref Range Status  . MRSA, PCR  08/11/2014 NEGATIVE  NEGATIVE Final  . Staphylococcus aureus 08/11/2014 NEGATIVE  NEGATIVE Final   Comment:                                 The Xpert SA Assay (FDA                          approved for NASAL specimens                          in patients over 6 years of age),                          is one component of                          a comprehensive surveillance                          program.  Test performance has                          been validated by [employer] for patients greater                          than or equal to 60 year old.                          It is not intended                          to diagnose infection nor to                          guide or monitor treatment.  Marland Kitchen aPTT 08/11/2014 31  24 - 37 seconds  Final  . WBC 08/11/2014 8.8  4.0 - 10.5 K/uL Final  . RBC 08/11/2014 4.88  3.87 - 5.11 MIL/uL Final  . Hemoglobin 08/11/2014 14.4  12.0 - 15.0 g/dL Final  . HCT 08/11/2014 42.5  36.0 - 46.0 % Final  . MCV 08/11/2014 87.1  78.0 - 100.0 fL Final  . MCH 08/11/2014 29.5  26.0 - 34.0 pg Final  . MCHC 08/11/2014 33.9  30.0 - 36.0 g/dL Final  . RDW 08/11/2014 12.9  11.5 - 15.5 % Final  . Platelets 08/11/2014 220  150 - 400 K/uL Final  . Sodium 08/11/2014 143  137 - 147 mEq/L Final  . Potassium 08/11/2014 4.6  3.7 - 5.3 mEq/L Final  . Chloride 08/11/2014 103  96 - 112 mEq/L Final  . CO2 08/11/2014 25  19 - 32 mEq/L Final  . Glucose, Bld 08/11/2014 104* 70 - 99 mg/dL Final  . BUN 08/11/2014 23  6 - 23 mg/dL Final  . Creatinine, Ser 08/11/2014 1.09  0.50 - 1.10 mg/dL Final  . Calcium 08/11/2014 9.4  8.4 - 10.5 mg/dL Final  . Total Protein 08/11/2014 7.4  6.0 - 8.3 g/dL Final  . Albumin 08/11/2014 3.8  3.5 - 5.2 g/dL Final  . AST 08/11/2014 35  0 - 37 U/L Final  . ALT 08/11/2014 31  0 - 35 U/L Final  . Alkaline Phosphatase 08/11/2014 42  39 - 117 U/L Final  . Total Bilirubin 08/11/2014 0.9  0.3 - 1.2 mg/dL Final  . GFR calc  non Af Amer 08/11/2014 49* >90 mL/min Final  . GFR calc Af Amer 08/11/2014 57* >90 mL/min Final   Comment: (NOTE)                          The eGFR has been calculated using the CKD EPI equation.                          This calculation has not been validated in all clinical situations.                          eGFR's persistently <90 mL/min signify possible Chronic Kidney                          Disease.  . Anion gap 08/11/2014 15  5 - 15 Final  . Prothrombin Time 08/11/2014 13.7  11.6 - 15.2 seconds Final  . INR 08/11/2014 1.04  0.00 - 1.49 Final  . ABO/RH(D) 08/11/2014 A POS   Final  . Antibody Screen 08/11/2014 NEG   Final  . Sample Expiration 08/11/2014 08/20/2014   Final  . Color, Urine 08/11/2014 YELLOW  YELLOW Final  . APPearance 08/11/2014 CLEAR  CLEAR Final  . Specific Gravity, Urine 08/11/2014 1.017  1.005 - 1.030 Final  . pH 08/11/2014 6.0  5.0 - 8.0 Final  . Glucose, UA 08/11/2014 NEGATIVE  NEGATIVE mg/dL Final  . Hgb urine dipstick 08/11/2014 NEGATIVE  NEGATIVE Final  . Bilirubin Urine 08/11/2014 NEGATIVE  NEGATIVE Final  . Ketones, ur 08/11/2014 NEGATIVE  NEGATIVE mg/dL Final  . Protein, ur 08/11/2014 NEGATIVE  NEGATIVE mg/dL Final  . Urobilinogen, UA 08/11/2014 0.2  0.0 - 1.0 mg/dL Final  . Nitrite 08/11/2014 NEGATIVE  NEGATIVE Final  . Leukocytes, UA 08/11/2014 NEGATIVE  NEGATIVE Final   MICROSCOPIC NOT DONE ON URINES WITH  NEGATIVE PROTEIN, BLOOD, LEUKOCYTES, NITRITE, OR GLUCOSE <1000 mg/dL.     X-Rays:Dg Chest 2 View  08/11/2014   CLINICAL DATA:  Preop knee replacement.  Hypertension  EXAM: CHEST  2 VIEW  COMPARISON:  12/10/2012  FINDINGS: Chronic elevation right hemidiaphragm unchanged. Mild right lower lobe atelectasis.  Mild cardiac enlargement without heart failure or effusion. Left lung clear.  IMPRESSION: No active cardiopulmonary disease.   Electronically Signed   By: Franchot Gallo M.D.   On: 08/11/2014 17:04    EKG: Orders placed during the hospital  encounter of 06/04/14  . EKG 12-LEAD  . EKG 12-LEAD     Hospital Course: [name] is a 73 y.o. who was admitted to [facility]. They were brought to the operating room on 08/17/2014 and underwent Procedure(s): LEFT TOTAL KNEE ARTHROPLASTY.  Patient tolerated the procedure well and was later transferred to the recovery room and then to the orthopaedic floor for postoperative care.  They were given PO and IV analgesics for pain control following their surgery.  They were given 24 hours of postoperative antibiotics of  Anti-infectives   Start     Dose/Rate Route Frequency Ordered Stop   08/17/14 2000  ceFAZolin (ANCEF) IVPB 2 g/50 mL premix     2 g 100 mL/hr over 30 Minutes Intravenous Every 6 hours 08/17/14 1530 08/18/14 0152   08/17/14 0600  ceFAZolin (ANCEF) 3 g in dextrose 5 % 50 mL IVPB     3 g 160 mL/hr over 30 Minutes Intravenous On call to O.R. 08/16/14 1320 08/17/14 1305     and started on DVT prophylaxis in the form of Xarelto.   PT and OT were ordered for total joint protocol.  Discharge planning consulted to help with postop disposition and equipment needs.  Patient had a good night on the evening of surgery.  Social worker got involved to assist with placement into [facility] postop.They started to get up OOB with therapy on day one. Hemovac drain was pulled without difficulty.  Continued to work with therapy into day two.  Dressing was changed on day two and the incision was healing well.  Patient was seen in rounds and was ready to go to [facility].  Diet: Cardiac diet Activity:WBAT Follow-up:in 2 weeks Disposition - [facility] Discharged Condition: improving       Discharge Instructions   Call MD / Call 911    Complete by:  As directed   If you experience chest pain or shortness of breath, CALL 911 and be transported to the hospital emergency room.  If you develope a fever above 101 F, pus (white drainage) or increased drainage  or redness at the wound, or calf pain, call your surgeon's office.     Change dressing    Complete by:  As directed   Change dressing daily with sterile 4 x 4 inch gauze dressing and apply TED hose. Do not submerge the incision under water.     Constipation Prevention    Complete by:  As directed   Drink plenty of fluids.  Prune juice may be helpful.  You may use a stool softener, such as Colace (over the counter) 100 mg twice a day.  Use MiraLax (over the counter) for constipation as needed.     Diet - low sodium heart healthy    Complete by:  As directed      Discharge instructions    Complete by:  As directed  Pick up stool softner and laxative for home. Do not submerge incision under water. May shower. Continue to use ice for pain and swelling from surgery.  Take Xarelto for two and a half more weeks, then discontinue Xarelto. Once the patient has completed the Xarelto, they may resume the 81 mg Aspirin.  When discharged from the skilled rehab facility, please have the facility set up the patient's [place] prior to being released.  Also provide the patient with their medications at time of release from the facility to include their pain medication, the muscle relaxants, and their blood thinner medication.  If the patient is still at the rehab facility at time of follow up appointment, please also assist the patient in arranging follow up appointment in our office and any transportation needs.     Do not put a pillow under the knee. Place it under the heel.    Complete by:  As directed      Do not sit on low chairs, stoools or toilet seats, as it may be difficult to get up from low surfaces    Complete by:  As directed      Driving restrictions    Complete by:  As directed   No driving until released by the physician.     Increase activity slowly as tolerated    Complete by:  As directed      Lifting restrictions    Complete by:  As directed   No lifting until  released by the physician.     Patient may shower    Complete by:  As directed   You may shower without a dressing once there is no drainage.  Do not wash over the wound.  If drainage remains, do not shower until drainage stops.     TED hose    Complete by:  As directed   Use stockings (TED hose) for 3 weeks on both leg(s).  You may remove them at night for sleeping.     Weight bearing as tolerated    Complete by:  As directed             Medication List    STOP taking these medications       aspirin EC 81 MG tablet     glucose blood test strip  Commonly known as:  ONETOUCH VERIO     vitamin C 1000 MG tablet      TAKE these medications       acetaminophen 325 MG tablet  Commonly known as:  TYLENOL  Take 2 tablets (650 mg total) by mouth every 6 (six) hours as needed for mild pain (or Fever >/= 101).     amLODipine-benazepril 5-20 MG per capsule  Commonly known as:  LOTREL  Take 1 capsule by mouth daily. @@ 11 am.     atenolol-chlorthalidone 50-25 MG per tablet  Commonly known as:  TENORETIC  Take 1 tablet by mouth daily. @@ 11 am     bisacodyl 10 MG suppository  Commonly known as:  DULCOLAX  Place 1 suppository (10 mg total) rectally daily as needed for moderate constipation.     DSS 100 MG Caps  Take 100 mg by mouth 2 (two) times daily.     famotidine 20 MG tablet  Commonly known as:  PEPCID  Take 20 mg by mouth daily. @ 11 am     HYDROmorphone 2 MG tablet  Commonly known as:  DILAUDID  Take 1-2 tablets (2-4  mg total) by mouth every 4 (four) hours as needed for moderate pain or severe pain.     methocarbamol 500 MG tablet  Commonly known as:  ROBAXIN  Take 1 tablet (500 mg total) by mouth every 6 (six) hours as needed for muscle spasms.     metoCLOPramide 5 MG tablet  Commonly known as:  REGLAN  Take 1 tablet (5 mg total) by mouth every 8 (eight) hours as needed for nausea (if ondansetron (ZOFRAN) ineffective.).     ondansetron 4 MG tablet  Commonly  known as:  ZOFRAN  Take 1 tablet (4 mg total) by mouth every 6 (six) hours as needed for nausea.     ONETOUCH DELICA LANCETS 24M Misc  Use to check blood glucose once daily     polyethylene glycol packet  Commonly known as:  MIRALAX / GLYCOLAX  Take 17 g by mouth daily as needed for mild constipation.     rivaroxaban 10 MG Tabs tablet  Commonly known as:  XARELTO  - Take 1 tablet (10 mg total) by mouth daily with breakfast. Take Xarelto for two and a half more weeks, then discontinue Xarelto.  - Once the patient has completed the Xarelto, they may resume the 81 mg Aspirin.     rosuvastatin 20 MG tablet  Commonly known as:  CRESTOR  Take 20 mg by mouth 3 (three) times a week.     traMADol 50 MG tablet  Commonly known as:  ULTRAM  Take 1-2 tablets (50-100 mg total) by mouth every 6 (six) hours as needed (mild pain).       Follow-up Information   Follow up with [name], MD. Schedule an appointment as soon as possible for a visit on 09/01/2014. (Call [phone] tomorrow to make the appointment)    Specialty:  Orthopedic Surgery   Contact information:   [address] [place] [address] [phone]       Signed: Arlee Muslim, PA-C Orthopaedic Surgery 08/18/2014, 10:43 PM

## 2014-08-18 NOTE — Discharge Instructions (Addendum)
° °Dr. Frank Aluisio °Total Joint Specialist °[facility] °[address] °[place], [place] [address] °[phone] ° °TOTAL KNEE REPLACEMENT POSTOPERATIVE DIRECTIONS ° ° ° °Knee Rehabilitation, Guidelines Following Surgery  °Results after knee surgery are often greatly improved when you follow the exercise, range of motion and muscle strengthening exercises prescribed by your doctor. Safety measures are also important to protect the knee from further injury. Any time any of these exercises cause you to have increased pain or swelling in your knee joint, decrease the amount until you are comfortable again and slowly increase them. If you have problems or questions, call your caregiver or physical therapist for advice.  ° °HOME CARE INSTRUCTIONS  °Remove items at home which could result in a fall. This includes throw rugs or furniture in walking pathways.  °Continue medications as instructed at time of discharge. °You may have some home medications which will be placed on hold until you complete the course of blood thinner medication.  °You may start showering once you are discharged home but do not submerge the incision under water. Just pat the incision dry and apply a dry gauze dressing on daily. °Walk with walker as instructed.  °You may resume a sexual relationship in one month or when given the OK by  your doctor.  °· Use walker as long as suggested by your caregivers. °· Avoid periods of inactivity such as sitting longer than an hour when not asleep. This helps prevent blood clots.  °You may put full weight on your legs and walk as much as is comfortable.  °You may return to work once you are cleared by your doctor.  °Do not drive a car for 6 weeks or until released by you surgeon.  °· Do not drive while taking narcotics.  °Wear the elastic stockings for three weeks following surgery during the day but you may remove then at night. °Make sure you keep all of your appointments after your  operation with all of your doctors and caregivers. You should call the office at the above phone number and make an appointment for approximately two weeks after the date of your surgery. °Change the dressing daily and reapply a dry dressing each time. °Please pick up a stool softener and laxative for home use as long as you are requiring pain medications. °· Continue to use ice on the knee for pain and swelling from surgery. You may notice swelling that will progress down to the foot and ankle.  This is normal after surgery.  Elevate the leg when you are not up walking on it.   °It is important for you to complete the blood thinner medication as prescribed by your doctor. °· Continue to use the breathing machine which will help keep your temperature down.  It is common for your temperature to cycle up and down following surgery, especially at night when you are not up moving around and exerting yourself.  The breathing machine keeps your lungs expanded and your temperature down. ° °RANGE OF MOTION AND STRENGTHENING EXERCISES  °Rehabilitation of the knee is important following a knee injury or an operation. After just a few days of immobilization, the muscles of the thigh which control the knee become weakened and shrink (atrophy). Knee exercises are designed to build up the tone and strength of the thigh muscles and to improve knee motion. Often times heat used for twenty to thirty minutes before working out will loosen up your tissues and help with improving the   range of motion but do not use heat for the first two weeks following surgery. These exercises can be done on a training (exercise) mat, on the floor, on a table or on a bed. Use what ever works the best and is most comfortable for you Knee exercises include:  Leg Lifts - While your knee is still immobilized in a splint or cast, you can do straight leg raises. Lift the leg to 60 degrees, hold for 3 sec, and slowly lower the leg. Repeat 10-20 times 2-3  times daily. Perform this exercise against resistance later as your knee gets better.  Quad and Hamstring Sets - Tighten up the muscle on the front of the thigh (Quad) and hold for 5-10 sec. Repeat this 10-20 times hourly. Hamstring sets are done by pushing the foot backward against an object and holding for 5-10 sec. Repeat as with quad sets.  A rehabilitation program following serious knee injuries can speed recovery and prevent re-injury in the future due to weakened muscles. Contact your doctor or a physical therapist for more information on knee rehabilitation.   SKILLED REHAB INSTRUCTIONS: If the patient is transferred to a skilled rehab facility following release from the hospital, a list of the current medications will be sent to the facility for the patient to continue.  When discharged from the skilled rehab facility, please have the facility set up the patient's [place] prior to being released. Also, the skilled facility will be responsible for providing the patient with their medications at time of release from the facility to include their pain medication, the muscle relaxants, and their blood thinner medication. If the patient is still at the rehab facility at time of the two week follow up appointment, the skilled rehab facility will also need to assist the patient in arranging follow up appointment in our office and any transportation needs.  MAKE SURE YOU:  Understand these instructions.  Will watch your condition.  Will get help right away if you are not doing well or get worse.    Pick up stool softner and laxative for home. Do not submerge incision under water. May shower. Continue to use ice for pain and swelling from surgery.  Take Xarelto for two and a half more weeks, then discontinue Xarelto. Once the patient has completed the Xarelto, they may resume the 81 mg Aspirin.  When discharged from the skilled rehab facility, please have the facility set up  the patient's [place] prior to being released.  Also provide the patient with their medications at time of release from the facility to include their pain medication, the muscle relaxants, and their blood thinner medication.  If the patient is still at the rehab facility at time of follow up appointment, please also assist the patient in arranging follow up appointment in our office and any transportation needs.     Information on my medicine - XARELTO (Rivaroxaban)  This medication education was reviewed with me or my healthcare representative as part of my discharge preparation.  The pharmacist that spoke with me during my hospital stay was:  Julio Sicks, [facility]  Why was Xarelto prescribed for you? Xarelto was prescribed for you to reduce the risk of blood clots forming after orthopedic surgery. The medical term for these abnormal blood clots is venous thromboembolism (VTE).  What do you need to know about xarelto ? Take your Xarelto ONCE DAILY at the same time every day. You may take it either  with or without food.  If you have difficulty swallowing the tablet whole, you may crush it and mix in applesauce just prior to taking your dose.  Take Xarelto exactly as prescribed by your doctor and DO NOT stop taking Xarelto without talking to the doctor who prescribed the medication.  Stopping without other VTE prevention medication to take the place of Xarelto may increase your risk of developing a clot.  After discharge, you should have regular check-up appointments with your healthcare provider that is prescribing your Xarelto.    What do you do if you miss a dose? If you miss a dose, take it as soon as you remember on the same day then continue your regularly scheduled once daily regimen the next day. Do not take two doses of Xarelto on the same day.   Important Safety Information A possible side effect of Xarelto is bleeding. You should call your healthcare  provider right away if you experience any of the following:   Bleeding from an injury or your nose that does not stop.   Unusual colored urine (red or dark brown) or unusual colored stools (red or black).   Unusual bruising for unknown reasons.   A serious fall or if you hit your head (even if there is no bleeding).  Some medicines may interact with Xarelto and might increase your risk of bleeding while on Xarelto. To help avoid this, consult your healthcare provider or pharmacist prior to using any new prescription or non-prescription medications, including herbals, vitamins, non-steroidal anti-inflammatory drugs (NSAIDs) and supplements.  This website has more information on Xarelto: [email or web address]

## 2014-08-18 NOTE — Evaluation (Signed)
Occupational Therapy Evaluation Patient Details Name: [name] MRN: [id number] DOB: 1941-04-06 Today's Date: 08/18/2014    History of Present Illness Pt is a 73 year old female s/p L TKA with hx of R TKA, obesity, R hand carpal tunnel    Clinical Impression   Pt was admitted for L TKA.  She will benefit from skilled OT to increase safety and independence with adls.  Pt was mod I prior to admission and goals in acute are for supervision level.    Follow Up Recommendations  SNF    Equipment Recommendations  3 in 1 bedside comode    Recommendations for Other Services       Precautions / Restrictions Precautions Precautions: Knee Required Braces or Orthoses: Knee Immobilizer - Left Knee Immobilizer - Left: Discontinue once straight leg raise with < 10 degree lag Restrictions Other Position/Activity Restrictions: WBAT      Mobility Bed Mobility Overal bed mobility: Needs Assistance Bed Mobility: Sit to Supine       Sit to supine: Supervision      Transfers Overall transfer level: Needs assistance Equipment used: Rolling walker (2 wheeled) Transfers: Sit to/from Stand Sit to Stand: Min guard         General transfer comment: cues for hand placement    Balance                                            ADL Overall ADL's : Needs assistance/impaired             Lower Body Bathing: Minimal assistance;Sit to/from stand       Lower Body Dressing: Minimal assistance;Sit to/from stand                 General ADL Comments: pt can complete UB adls with set up.  She had just returned from bathroom:  min guard for sit to stand and cues for hand placement     Vision                     Perception     Praxis      Pertinent Vitals/Pain Pain Assessment: 0-10 Pain Score: 2  Pain Location: L knee Pain Descriptors / Indicators: Aching Pain Intervention(s): Limited activity within patient's tolerance;Monitored during  session;Ice applied;Repositioned     Hand Dominance     Extremity/Trunk Assessment Upper Extremity Assessment Upper Extremity Assessment: Overall WFL for tasks assessed (states carpal tunnel still bothering her:  using washcloth on RW)          Communication Communication Communication: No difficulties   Cognition Arousal/Alertness: Awake/alert Behavior During Therapy: WFL for tasks assessed/performed Overall Cognitive Status: Within Functional Limits for tasks assessed                     General Comments       Exercises       Shoulder Instructions      Home Living Family/patient expects to be discharged to:: Skilled nursing facility Living Arrangements: Spouse/significant other                                      Prior Functioning/Environment Level of Independence: Independent             OT Diagnosis: Generalized weakness;Acute  pain   OT Problem List: Decreased strength;Decreased activity tolerance;Pain;Decreased knowledge of use of DME or AE   OT Treatment/Interventions: Self-care/ADL training;DME and/or AE instruction;Patient/family education    OT Goals(Current goals can be found in the care plan section) Acute Rehab OT Goals Patient Stated Goal: rehab then home OT Goal Formulation: With patient Time For Goal Achievement: 08/25/14 Potential to Achieve Goals: Good ADL Goals Pt Will Transfer to Toilet: with supervision;bedside commode;ambulating Additional ADL Goal #1: pt will complete LB adls with set up/supervision sit to stand  OT Frequency: Min 2X/week   Barriers to D/C:            Co-evaluation              End of Session    Activity Tolerance: Patient tolerated treatment well Patient left: in bed;with call bell/phone within reach;with family/visitor present   Time: 1327-1340 OT Time Calculation (min): 13 min Charges:  OT General Charges $OT Visit: 1 Procedure OT Evaluation $Initial OT Evaluation Tier  I: 1 Procedure G-Codes:    Keyshawna Prouse 08/24/2014, 1:53 PM  Lesle Chris, OTR/L [phone] August 24, 2014

## 2014-08-18 NOTE — Progress Notes (Signed)
Subjective: 1 Day Post-Op Procedure(s) (LRB): LEFT TOTAL KNEE ARTHROPLASTY (Left) Patient reports pain as mild.  She had a good night. Patient seen in rounds with Dr. Wynelle Link.  Family in room. Patient is well, and has had no acute complaints or problems We will start therapy today.  Plan is to go [facility] after hospital stay.  Objective: Vital signs in last 24 hours: Temp:  [97.5 F (36.4 C)-98.4 F (36.9 C)] 98.1 F (36.7 C) (10/20 0605) Pulse Rate:  [55-84] 76 (10/20 0605) Resp:  [14-18] 17 (10/20 0605) BP: (112-175)/(49-96) 150/64 mmHg (10/20 0605) SpO2:  [94 %-100 %] 94 % (10/20 0605) Weight:  [123.832 kg (273 lb)] 123.832 kg (273 lb) (10/19 1028)  Intake/Output from previous day:  Intake/Output Summary (Last 24 hours) at 08/18/14 0828 Last data filed at 08/18/14 0615  Gross per 24 hour  Intake 4457.5 ml  Output   2855 ml  Net 1602.5 ml    Intake/Output this shift: UOP 1200 since MN +1602  Labs:  Recent Labs  08/18/14 0430  HGB 12.5    Recent Labs  08/18/14 0430  WBC 18.0*  RBC 4.24  HCT 36.4  PLT 219    Recent Labs  08/18/14 0430  NA 136*  K 4.2  CL 101  CO2 24  BUN 24*  CREATININE 1.17*  GLUCOSE 189*  CALCIUM 8.6   No results found for this basename: LABPT, INR,  in the last 72 hours  EXAM General - Patient is Alert, Appropriate and Oriented Extremity - Neurovascular intact Sensation intact distally Dorsiflexion/Plantar flexion intact Dressing - dressing C/D/I Motor Function - intact, moving foot and toes well on exam.  Hemovac pulled without difficulty.  Past Medical History  Diagnosis Date  . Hyperlipidemia   . Hypertension   . Obesity   . Diverticulitis hx of  . Diverticulosis   . Abdominal pain   . Constipation   . Colon adenoma   . GERD (gastroesophageal reflux disease)   . Pneumonia 2005  . Numbness and tingling in right hand     pt. states has numbness of right hand very frequently-watch positioning  .  Personal history of colonic polyps-adenoma 08/26/2008  . H/O hiatal hernia   . Frequent UTI     hx of urethral injury during colon surgery - states frequent uti's since  . History of shingles     has a lingering itching on back where shingles were  . Pain     pain left knee and pain right hip and right groin  . Osteoporosis   . History of palpitations     in the past  . Arthritis     cervical disc degeneration/ oa left knee, carpal tunnel rt wrist, adhesive capsulitis right shoulder, rt hand weakness; lumbar degeneration  . Complication of anesthesia 2006-at [facility]    breathing problems-no BP med given prior to surgery;  hx of being very sleepy after colon surgery --  states no problems with last right total knee replacement 2013  . Diabetes mellitus without complication     borderline - diet control    Assessment/Plan: 1 Day Post-Op Procedure(s) (LRB): LEFT TOTAL KNEE ARTHROPLASTY (Left) Principal Problem:   OA (osteoarthritis) of knee  Estimated body mass index is 42.75 kg/(m^2) as calculated from the following:   Height as of this encounter: 5\' 7"  (1.702 m).   Weight as of this encounter: 123.832 kg (273 lb). Advance diet Up with therapy Discharge to SNF [facility]  when ready.  DVT Prophylaxis - Xarelto Weight-Bearing as tolerated to left leg D/C O2 and Pulse OX and try on Room Air  Arlee Muslim, PA-C Orthopaedic Surgery 08/18/2014, 8:28 AM

## 2014-08-18 NOTE — Progress Notes (Signed)
Physical Therapy Treatment Note   08/18/14 1600  PT Visit Information  Last PT Received On 08/18/14  Assistance Needed +1  History of Present Illness Pt is a 73 year old female s/p L TKA with hx of R TKA, obesity, R hand carpal tunnel   PT Time Calculation  PT Start Time 1512  PT Stop Time 1523  PT Time Calculation (min) 11 min  Subjective Data  Subjective Pt ambulated in hallway and making very good progress.  Precautions  Precautions Knee  Required Braces or Orthoses Knee Immobilizer - Left  Knee Immobilizer - Left Discontinue once straight leg raise with < 10 degree lag  Restrictions  Other Position/Activity Restrictions WBAT  Pain Assessment  Pain Assessment 0-10  Pain Score 2  Pain Location L knee  Pain Descriptors / Indicators Aching;Sore  Pain Intervention(s) Limited activity within patient's tolerance;Monitored during session;Ice applied  Cognition  Arousal/Alertness Awake/alert  Behavior During Therapy WFL for tasks assessed/performed  Overall Cognitive Status Within Functional Limits for tasks assessed  Bed Mobility  Overal bed mobility Needs Assistance  Bed Mobility Supine to Sit;Sit to Supine  Supine to sit Supervision  Sit to supine Supervision  Transfers  Overall transfer level Needs assistance  Equipment used Rolling walker (2 wheeled)  Transfers Sit to/from Stand  Sit to Stand Supervision  General transfer comment verbal cue for LE positioning  Ambulation/Gait  Ambulation/Gait assistance Supervision  Ambulation Distance (Feet) 200 Feet  Assistive device Rolling walker (2 wheeled)  Gait Pattern/deviations Step-through pattern;Decreased step length - right;Decreased stance time - left  General Gait Details progessing well with gait, may try without KI tomorrow  PT - End of Session  Equipment Utilized During Treatment Left knee immobilizer  Activity Tolerance Patient tolerated treatment well  Patient left in bed;with call bell/phone within reach  PT -  Assessment/Plan  PT Plan Current plan remains appropriate  PT Frequency 7X/week  Follow Up Recommendations SNF  PT equipment None recommended by PT  PT Goal Progression  Progress towards PT goals Progressing toward goals  PT General Charges  $$ ACUTE PT VISIT 1 Procedure  PT Treatments  $Gait Training 8-22 mins   Carmelia Bake, PT, DPT 08/18/2014 Pager: [phone]

## 2014-08-18 NOTE — Evaluation (Signed)
Physical Therapy Evaluation Patient Details Name: [name] MRN: [id number] DOB: 1941/04/15 Today's Date: 08/18/2014   History of Present Illness  Pt is a 73 year old female s/p L TKA with hx of R TKA, obesity, R hand carpal tunnel   Clinical Impression  Pt is s/p L TKA resulting in the deficits listed below (see PT Problem List).  Pt will benefit from skilled PT to increase their independence and safety with mobility to allow discharge to the venue listed below.  Pt mobilizing well POD #1 and plans to d/c to SNF for rehab.     Follow Up Recommendations SNF    Equipment Recommendations  None recommended by PT    Recommendations for Other Services       Precautions / Restrictions Precautions Precautions: Knee Required Braces or Orthoses: Knee Immobilizer - Left Knee Immobilizer - Left: Discontinue once straight leg raise with < 10 degree lag Restrictions Other Position/Activity Restrictions: WBAT      Mobility  Bed Mobility Overal bed mobility: Needs Assistance Bed Mobility: Sit to Supine       Sit to supine: Supervision      Transfers Overall transfer level: Needs assistance Equipment used: Rolling walker (2 wheeled) Transfers: Sit to/from Stand Sit to Stand: Min guard         General transfer comment: verbal cues for UE and LE positioning   Ambulation/Gait Ambulation/Gait assistance: Min guard Ambulation Distance (Feet): 100 Feet Assistive device: Rolling walker (2 wheeled) Gait Pattern/deviations: Step-through pattern;Trunk flexed;Decreased stance time - left;Decreased step length - right     General Gait Details: verbal cues for sequence, RW positioning, step length, posture  Stairs            Wheelchair Mobility    Modified Rankin (Stroke Patients Only)       Balance                                             Pertinent Vitals/Pain Pain Assessment: 0-10 Pain Score: 2  Pain Location: L knee Pain Descriptors /  Indicators: Aching;Sore Pain Intervention(s): Limited activity within patient's tolerance;Monitored during session;Repositioned;Ice applied    Home Living Family/patient expects to be discharged to:: Skilled nursing facility Living Arrangements: Spouse/significant other                    Prior Function Level of Independence: Independent               Hand Dominance        Extremity/Trunk Assessment               Lower Extremity Assessment: LLE deficits/detail   LLE Deficits / Details: good quad strength, able to perform SLR, knee flexion AAROM approx 95* supine     Communication   Communication: No difficulties  Cognition Arousal/Alertness: Awake/alert Behavior During Therapy: WFL for tasks assessed/performed Overall Cognitive Status: Within Functional Limits for tasks assessed                      General Comments      Exercises Total Joint Exercises Ankle Circles/Pumps: AROM;Both;15 reps Quad Sets: AROM;Both;15 reps Gluteal Sets: AROM;Both;20 reps Towel Squeeze: AROM;Both;20 reps Short Arc Quad: AROM;Left;15 reps Heel Slides: AAROM;AROM;Both;10 reps;Other (comment) (pt actively performed 10 reps then [facility] for 10 reps to increase ROM) Hip ABduction/ADduction: AROM;Left;15 reps Straight Leg Raises:  AROM;Left;10 reps      Assessment/Plan    PT Assessment Patient needs continued PT services  PT Diagnosis Difficulty walking   PT Problem List Decreased strength;Decreased range of motion;Decreased mobility;Obesity  PT Treatment Interventions Functional mobility training;Gait training;DME instruction;Patient/family education;Therapeutic activities;Therapeutic exercise   PT Goals (Current goals can be found in the Care Plan section) Acute Rehab PT Goals PT Goal Formulation: With patient Time For Goal Achievement: 08/22/14 Potential to Achieve Goals: Good    Frequency 7X/week   Barriers to discharge        Co-evaluation                End of Session Equipment Utilized During Treatment: Left knee immobilizer Activity Tolerance: Patient tolerated treatment well Patient left: in bed;with call bell/phone within reach;with family/visitor present           Time: 1002-1027 PT Time Calculation (min): 25 min   Charges:   PT Evaluation $Initial PT Evaluation Tier I: 1 Procedure PT Treatments $Gait Training: 8-22 mins $Therapeutic Exercise: 8-22 mins   PT G Codes:          Samit Sylve,KATHrine E 08/18/2014, 12:22 PM Carmelia Bake, PT, DPT 08/18/2014 Pager: [phone]

## 2014-08-19 LAB — BASIC METABOLIC PANEL
Anion gap: 11 (ref 5–15)
BUN: 28 mg/dL — ABNORMAL HIGH (ref 6–23)
CO2: 24 mEq/L (ref 19–32)
Calcium: 8.8 mg/dL (ref 8.4–10.5)
Chloride: 99 mEq/L (ref 96–112)
Creatinine, Ser: 1.13 mg/dL — ABNORMAL HIGH (ref 0.50–1.10)
GFR calc Af Amer: 55 mL/min — ABNORMAL LOW (ref >90)
GFR calc non Af Amer: 47 mL/min — ABNORMAL LOW (ref >90)
Glucose, Bld: 160 mg/dL — ABNORMAL HIGH (ref 70–99)
Potassium: 4.5 mEq/L (ref 3.7–5.3)
Sodium: 134 mEq/L — ABNORMAL LOW (ref 137–147)

## 2014-08-19 LAB — CBC
HCT: 35.3 % — ABNORMAL LOW (ref 36.0–46.0)
Hemoglobin: 12.1 g/dL (ref 12.0–15.0)
MCH: 29.6 pg (ref 26.0–34.0)
MCHC: 34.3 g/dL (ref 30.0–36.0)
MCV: 86.3 fL (ref 78.0–100.0)
Platelets: 219 10*3/uL (ref 150–400)
RBC: 4.09 MIL/uL (ref 3.87–5.11)
RDW: 12.8 % (ref 11.5–15.5)
WBC: 21.8 10*3/uL — ABNORMAL HIGH (ref 4.0–10.5)

## 2014-08-19 NOTE — Progress Notes (Signed)
Pt for discharge to [facility].  CSW facilitated pt discharge needs including contacting facility, faxing pt discharge information via [facility], discussing with pt at bedside. Pt reports that pt husband plans to transport via private vehicle and will arrive to hospital around 11 am for transport. RN aware. CSW provided RN phone number to call report. CSW provided pt discharge packet to provide to [facility] upon arrival and provided pt with contact information to notify [facility] when leaving hospital in order for facility to have wheelchair available for pt upon arrival. Pt expressed understanding.  Pt coping appropriately with transition to [facility] today.   No further social work needs identified at this time.  CSW signing off.   Alison Murray, MSW, LCSW Clinical Social Work Coverage for [employer], [place]

## 2014-08-19 NOTE — Progress Notes (Signed)
Subjective: 2 Days Post-Op Procedure(s) (LRB): LEFT TOTAL KNEE ARTHROPLASTY (Left) Patient reports pain as mild.   Patient seen in rounds with Dr. Wynelle Link. Patient is well, and has had no acute complaints or problems Patient is ready to go to [facility]  Objective: Vital signs in last 24 hours: Temp:  [98.2 F (36.8 C)-98.7 F (37.1 C)] 98.2 F (36.8 C) (10/21 0609) Pulse Rate:  [70-85] 85 (10/21 0609) Resp:  [16] 16 (10/21 0609) BP: (94-145)/(56-66) 94/63 mmHg (10/21 0609) SpO2:  [95 %-100 %] 100 % (10/21 0609)  Intake/Output from previous day:  Intake/Output Summary (Last 24 hours) at 08/19/14 0741 Last data filed at 08/19/14 0600  Gross per 24 hour  Intake    960 ml  Output   3950 ml  Net  -2990 ml     Labs:  Recent Labs  08/18/14 0430 08/19/14 0439  HGB 12.5 12.1    Recent Labs  08/18/14 0430 08/19/14 0439  WBC 18.0* 21.8*  RBC 4.24 4.09  HCT 36.4 35.3*  PLT 219 219    Recent Labs  08/18/14 0430 08/19/14 0439  NA 136* 134*  K 4.2 4.5  CL 101 99  CO2 24 24  BUN 24* 28*  CREATININE 1.17* 1.13*  GLUCOSE 189* 160*  CALCIUM 8.6 8.8   No results found for this basename: LABPT, INR,  in the last 72 hours  EXAM: General - Patient is Alert, Appropriate and Oriented Extremity - Neurovascular intact Sensation intact distally Incision - clean, dry, no drainage, healing Motor Function - intact, moving foot and toes well on exam.   Assessment/Plan: 2 Days Post-Op Procedure(s) (LRB): LEFT TOTAL KNEE ARTHROPLASTY (Left) Procedure(s) (LRB): LEFT TOTAL KNEE ARTHROPLASTY (Left) Past Medical History  Diagnosis Date  . Hyperlipidemia   . Hypertension   . Obesity   . Diverticulitis hx of  . Diverticulosis   . Abdominal pain   . Constipation   . Colon adenoma   . GERD (gastroesophageal reflux disease)   . Pneumonia 2005  . Numbness and tingling in right hand     pt. states has numbness of right hand very frequently-watch positioning  .  Personal history of colonic polyps-adenoma 08/26/2008  . H/O hiatal hernia   . Frequent UTI     hx of urethral injury during colon surgery - states frequent uti's since  . History of shingles     has a lingering itching on back where shingles were  . Pain     pain left knee and pain right hip and right groin  . Osteoporosis   . History of palpitations     in the past  . Arthritis     cervical disc degeneration/ oa left knee, carpal tunnel rt wrist, adhesive capsulitis right shoulder, rt hand weakness; lumbar degeneration  . Complication of anesthesia 2006-at [facility]    breathing problems-no BP med given prior to surgery;  hx of being very sleepy after colon surgery --  states no problems with last right total knee replacement 2013  . Diabetes mellitus without complication     borderline - diet control   Principal Problem:   OA (osteoarthritis) of knee  Estimated body mass index is 42.75 kg/(m^2) as calculated from the following:   Height as of this encounter: 5\' 7"  (1.702 m).   Weight as of this encounter: 123.832 kg (273 lb). Up with therapy Discharge to SNF - Transfer to [facility] Follow up - in 2 weeks  Activity - WBAT Disposition - [facility] Condition Upon Discharge - Good D/C Meds - See DC Summary DVT Prophylaxis - Xarelto  Arlee Muslim, PA-C Orthopaedic Surgery 08/19/2014, 7:41 AM

## 2014-08-19 NOTE — Progress Notes (Signed)
Physical Therapy Treatment Patient Details Name: [name] MRN: [id number] DOB: [birth year] Today's Date: 2014/09/17    History of Present Illness Pt is a 73 year old female s/p L TKA with hx of R TKA, obesity, R hand carpal tunnel     PT Comments    Pt progressing very well and to d/c to [facility] today.  Follow Up Recommendations  SNF     Equipment Recommendations  None recommended by PT    Recommendations for Other Services       Precautions / Restrictions Precautions Precautions: Knee Precaution Comments: able to perform SLR Required Braces or Orthoses: Knee Immobilizer - Left Knee Immobilizer - Left: Discontinue once straight leg raise with < 10 degree lag Restrictions Other Position/Activity Restrictions: WBAT    Mobility  Bed Mobility Overal bed mobility: Modified Independent                Transfers Overall transfer level: Needs assistance Equipment used: Rolling walker (2 wheeled) Transfers: Sit to/from Stand Sit to Stand: Supervision         General transfer comment: verbal cue for LE positioning  Ambulation/Gait Ambulation/Gait assistance: Supervision Ambulation Distance (Feet): 240 Feet Assistive device: Rolling walker (2 wheeled) Gait Pattern/deviations: Step-through pattern;Decreased step length - right;Antalgic     General Gait Details: verbal cues for allowing knee flexion as tolerated, heel strike   Stairs            Wheelchair Mobility    Modified Rankin (Stroke Patients Only)       Balance                                    Cognition Arousal/Alertness: Awake/alert Behavior During Therapy: WFL for tasks assessed/performed Overall Cognitive Status: Within Functional Limits for tasks assessed                      Exercises Total Joint Exercises Ankle Circles/Pumps: AROM;Both;15 reps Quad Sets: AROM;Both;15 reps Gluteal Sets: AROM;Both;20 reps Towel Squeeze: AROM;Both;20 reps Short  Arc Quad: AROM;Left;15 reps Heel Slides: AAROM;Both;Supine;15 reps Hip ABduction/ADduction: AROM;Left;15 reps Straight Leg Raises: AROM;Left;10 reps    General Comments        Pertinent Vitals/Pain Pain Assessment: 0-10 Pain Score: 1  Pain Location: L knee Pain Descriptors / Indicators: Sore Pain Intervention(s): Limited activity within patient's tolerance;Premedicated before session;Ice applied;Repositioned    Home Living                      Prior Function            PT Goals (current goals can now be found in the care plan section) Progress towards PT goals: Progressing toward goals    Frequency  7X/week    PT Plan Current plan remains appropriate    Co-evaluation             End of Session   Activity Tolerance: Patient tolerated treatment well Patient left: in chair;with call bell/phone within reach     Time: 0928-0952 PT Time Calculation (min): 24 min  Charges:  $Gait Training: 8-22 mins $Therapeutic Exercise: 8-22 mins                    G Codes:      [name],[name] 09/17/2014, 1:01 PM Carmelia Bake, PT, DPT 2014/09/17 Pager: [phone]

## 2014-08-20 ENCOUNTER — Non-Acute Institutional Stay (SKILLED_NURSING_FACILITY): Payer: Medicare Other | Admitting: Internal Medicine

## 2014-08-20 DIAGNOSIS — M1712 Unilateral primary osteoarthritis, left knee: Secondary | ICD-10-CM

## 2014-08-20 DIAGNOSIS — I10 Essential (primary) hypertension: Secondary | ICD-10-CM

## 2014-08-20 DIAGNOSIS — E785 Hyperlipidemia, unspecified: Secondary | ICD-10-CM

## 2014-08-20 DIAGNOSIS — K219 Gastro-esophageal reflux disease without esophagitis: Secondary | ICD-10-CM

## 2014-08-23 DIAGNOSIS — E785 Hyperlipidemia, unspecified: Secondary | ICD-10-CM | POA: Insufficient documentation

## 2014-08-23 NOTE — Progress Notes (Signed)
HISTORY & PHYSICAL  DATE: 08/20/2014   FACILITY: [facility]  LEVEL OF CARE: SNF (31)  ALLERGIES:  Allergies  Allergen Reactions  . Bactrim [Sulfamethoxazole-Tmp Ds]   . Lipitor [Atorvastatin Calcium] Itching  . Other     Bee stings  . Oxycodone     hallucinations  . Vicodin [Hydrocodone-Acetaminophen]     hallucinations    CHIEF COMPLAINT:  Manage left knee OA, HTN & hyperlipidemia  HISTORY OF PRESENT ILLNESS: Pt is a 73 y/o AAF:  KNEE OSTEOARTHRITIS: Patient had a history of pain and functional disability in the knee due to end-stage osteoarthritis and has failed nonsurgical conservative treatments. Patient had worsening of pain with activity and weight bearing, pain that interfered with activities of daily living & pain with passive range of motion. Therefore patient underwent total knee arthroplasty and tolerated the procedure well. Patient is admitted to this facility for sort short-term rehabilitation. Patient denies knee pain.  HTN: Pt 's HTN remains stable.  Denies CP, sob, DOE, pedal edema, headaches, dizziness or visual disturbances.  No complications from the medications currently being used.  Last BP :160/90.  HYPERLIPIDEMIA: No complications from the medications presently being used. Last fasting lipid panel not available.  PAST MEDICAL HISTORY :  Past Medical History  Diagnosis Date  . Hyperlipidemia   . Hypertension   . Obesity   . Diverticulitis hx of  . Diverticulosis   . Abdominal pain   . Constipation   . Colon adenoma   . GERD (gastroesophageal reflux disease)   . Pneumonia 2005  . Numbness and tingling in right hand     pt. states has numbness of right hand very frequently-watch positioning  . Personal history of colonic polyps-adenoma 08/26/2008  . H/O hiatal hernia   . Frequent UTI     hx of urethral injury during colon surgery - states frequent uti's since  . History of shingles     has a lingering itching on  back where shingles were  . Pain     pain left knee and pain right hip and right groin  . Osteoporosis   . History of palpitations     in the past  . Arthritis     cervical disc degeneration/ oa left knee, carpal tunnel rt wrist, adhesive capsulitis right shoulder, rt hand weakness; lumbar degeneration  . Complication of anesthesia 2006-at [facility]    breathing problems-no BP med given prior to surgery;  hx of being very sleepy after colon surgery --  states no problems with last right total knee replacement 2013  . Diabetes mellitus without complication     borderline - diet control    PAST SURGICAL HISTORY: Past Surgical History  Procedure Laterality Date  . Abdominal hysterectomy    . Bladder tack    . Colonoscopy [id number]    . Oophorectomy    . Ureter repair for tyransected left ureter    . Temporary ureter stent    . 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 Joint replacement    . Colon resection  2008    hx diverticulosis  . Carpal tunnel release Right 06/04/2014    Procedure: RIGHT CARPAL TUNNEL RELEASE;  Surgeon: Roseanne Kaufman, MD;  Location: [facility];  Service: Orthopedics;  Laterality: Right;  . Appendectomy    .  Breast surgery      breast duct resection- benign  . Colon surgery    . Skin graft left arm - traumatic compression injury left upper arm    . Total knee arthroplasty Left 08/17/2014    Procedure: LEFT TOTAL KNEE ARTHROPLASTY;  Surgeon: [name], MD;  Location: [facility];  Service: Orthopedics;  Laterality: Left;    SOCIAL HISTORY:  reports that she has never smoked. She has never used smokeless tobacco. She reports that she does not drink alcohol or use illicit drugs.  FAMILY HISTORY:  Family History  Problem Relation Age of Onset  . Breast cancer Mother   . Prostate cancer Father   . Colon cancer  Father 60    CURRENT MEDICATIONS: Reviewed per MAR/see medication list  REVIEW OF SYSTEMS:  See HPI otherwise 14 point ROS is negative.  PHYSICAL EXAMINATION  VS:  See VS section  GENERAL: no acute distress, morbidly obese body habitus EYES: conjunctivae normal, sclerae normal, normal eye lids MOUTH/THROAT: lips without lesions,no lesions in the mouth,tongue is without lesions,uvula elevates in midline NECK: supple, trachea midline, no neck masses, no thyroid tenderness, no thyromegaly LYMPHATICS: no LAN in the neck, no supraclavicular LAN RESPIRATORY: breathing is even & unlabored, BS CTAB CARDIAC: RRR, no murmur,no extra heart sounds, +1 BLE edema GI:  ABDOMEN: abdomen soft, normal BS, no masses, no tenderness  LIVER/SPLEEN: no hepatomegaly, no splenomegaly MUSCULOSKELETAL: HEAD: normal to inspection  EXTREMITIES: LEFT UPPER EXTREMITY: full range of motion, normal strength & tone RIGHT UPPER EXTREMITY:  full range of motion, normal strength & tone LEFT LOWER EXTREMITY:  range of motion not tested due to surgery, normal strength & tone RIGHT LOWER EXTREMITY:  full range of motion, normal strength & tone PSYCHIATRIC: the patient is alert & oriented to person, affect & behavior appropriate  LABS/RADIOLOGY:  Labs reviewed: Basic Metabolic Panel:  Recent Labs  08/11/14 1500 08/18/14 0430 08/19/14 0439  NA 143 136* 134*  K 4.6 4.2 4.5  CL 103 101 99  CO2 25 24 24   GLUCOSE 104* 189* 160*  BUN 23 24* 28*  CREATININE 1.09 1.17* 1.13*  CALCIUM 9.4 8.6 8.8   Liver Function Tests:  Recent Labs  06/09/14 0923 06/14/14 0132 08/11/14 1500  AST 36 46* 35  ALT 30 35 31  ALKPHOS 44 49 42  BILITOT 1.0 0.5 0.9  PROT 7.3 7.6 7.4  ALBUMIN 3.9 3.7 3.8    Recent Labs  06/14/14 0132  LIPASE 35   CBC:  Recent Labs  11/21/13 1135 06/09/14 0923 06/14/14 0132 08/11/14 1500 08/18/14 0430 08/19/14 0439  WBC 9.7 9.7 10.4 8.8 18.0* 21.8*  NEUTROABS 5.6 5.4 5.1  --    --   --   HGB 14.5 14.9 14.3 14.4 12.5 12.1  HCT 43.0 44.0 41.8 42.5 36.4 35.3*  MCV 89.0 89.1 86.9 87.1 85.8 86.3  PLT 232.0 250.0 248 220 219 219   Lipid Panel:  Recent Labs  11/21/13 1135 06/09/14 0923  HDL 44.60 34.70*   CBG:  Recent Labs  08/17/14 1018  GLUCAP 106*    CHEST  2 VIEW   COMPARISON:  12/10/2012   FINDINGS: Chronic elevation right hemidiaphragm unchanged. Mild right lower lobe atelectasis.   Mild cardiac enlargement without heart failure or effusion. Left lung clear.   IMPRESSION: No active cardiopulmonary disease  ASSESSMENT/PLAN:  Left knee OA-s/p total knee arthroplasty.  Cont. Rehabilitation. HTN-last BP elevated.  Will monitor. Hyperlipidemia-cont. crestor GERD-cont. pepcid Constipation-cont. Current laxatives.  Check cbc w diff & bmp  I have reviewed patient's medical records received at admission/from hospitalization.  CPT CODE: 11216  Ethan Kasperski Y Tonie Elsey, [place] [phone]

## 2014-08-28 ENCOUNTER — Non-Acute Institutional Stay (SKILLED_NURSING_FACILITY): Payer: Medicare Other | Admitting: Adult Health

## 2014-08-28 ENCOUNTER — Encounter: Payer: Self-pay | Admitting: Adult Health

## 2014-08-28 DIAGNOSIS — E785 Hyperlipidemia, unspecified: Secondary | ICD-10-CM

## 2014-08-28 DIAGNOSIS — K219 Gastro-esophageal reflux disease without esophagitis: Secondary | ICD-10-CM

## 2014-08-28 DIAGNOSIS — I10 Essential (primary) hypertension: Secondary | ICD-10-CM

## 2014-08-28 DIAGNOSIS — M1712 Unilateral primary osteoarthritis, left knee: Secondary | ICD-10-CM

## 2014-08-28 NOTE — Progress Notes (Signed)
Patient ID: [name], female   DOB: 06/17/1941, 73 y.o.   MRN: [id number]              PROGRESS NOTE  DATE: 08/28/2014   FACILITY: [facility]  LEVEL OF CARE: SNF (31)   Discharge Notes  HISTORY OF PRESENT ILLNESS:   This is a 73 year old female who is for discharge home with Home health PT and OT. She has been admitted to [facility] on 08/19/14 from [facility] with Osteoarthritis S/P Left total knee arthroplasty. Patient was admitted to this facility for short-term rehabilitation after the patient's recent hospitalization.  Patient has completed SNF rehabilitation and therapy has cleared the patient for discharge.  Reassessment of ongoing problem(s):  HTN: Pt 's HTN remains stable.  Denies CP, sob, DOE, headaches, dizziness or visual disturbances.  No complications from the medications currently being used.  Last BP : 142/70  GERD: pt's GERD is stable.  Denies ongoing heartburn, abd. Pain, nausea or vomiting.  Currently on a PPI & tolerates it without any adverse reactions.  HYPERLIPIDEMIA: No complications from the medications presently being used.  8/15 fasting lipid panel showed : Cholesterol 234 triglycerides 153 LDL 169 HDL 34.70   Past Medical History  Diagnosis Date  . Hyperlipidemia   . Hypertension   . Obesity   . Diverticulitis hx of  . Diverticulosis   . Abdominal pain   . Constipation   . Colon adenoma   . GERD (gastroesophageal reflux disease)   . Pneumonia 2005  . Numbness and tingling in right hand     pt. states has numbness of right hand very frequently-watch positioning  . Personal history of colonic polyps-adenoma 08/26/2008  . H/O hiatal hernia   . Frequent UTI     hx of urethral injury during colon surgery - states frequent uti's since  . History of shingles     has a lingering itching on back where shingles were  . Pain     pain left knee and pain right hip and right groin  . Osteoporosis   . History of  palpitations     in the past  . Arthritis     cervical disc degeneration/ oa left knee, carpal tunnel rt wrist, adhesive capsulitis right shoulder, rt hand weakness; lumbar degeneration  . Complication of anesthesia 2006-at [facility]    breathing problems-no BP med given prior to surgery;  hx of being very sleepy after colon surgery --  states no problems with last right total knee replacement 2013  . Diabetes mellitus without complication     borderline - diet control     Reviewed.  No changes/see problem list  CURRENT MEDICATIONS: Reviewed per MAR/see medication list    Allergies  Allergen Reactions  . Bactrim [Sulfamethoxazole-Tmp Ds]   . Lipitor [Atorvastatin Calcium] Itching  . Other     Bee stings  . Oxycodone     hallucinations  . Vicodin [Hydrocodone-Acetaminophen]     hallucinations     REVIEW OF SYSTEMS:  GENERAL: no change in appetite, no fatigue, no weight changes, no fever, chills or weakness RESPIRATORY: no cough, SOB, DOE, wheezing, hemoptysis CARDIAC: no chest pain, or palpitations, +edema GI: no abdominal pain, diarrhea, constipation, heart burn, nausea or vomiting  PHYSICAL EXAMINATION  GENERAL: no acute distress, obese EYES: conjunctivae normal, sclerae normal, normal eye lids NECK: supple, trachea midline, no neck masses, no thyroid tenderness, no thyromegaly LYMPHATICS: no LAN in the neck, no supraclavicular LAN  RESPIRATORY: breathing is even & unlabored, BS CTAB CARDIAC: RRR, no murmur,no extra heart sounds, LLE edema 2+ GI: abdomen soft, normal BS, no masses, no tenderness, no hepatomegaly, no splenomegaly EXTREMITIES: able to move all 4 extremities; PSYCHIATRIC: the patient is alert & oriented to person, affect & behavior appropriate  LABS/RADIOLOGY:  Dg Chest 2 View  08/11/2014   CLINICAL DATA:  Preop knee replacement.  Hypertension  EXAM: CHEST  2 VIEW  COMPARISON:  12/10/2012  FINDINGS: Chronic elevation right hemidiaphragm unchanged. Mild  right lower lobe atelectasis.  Mild cardiac enlargement without heart failure or effusion. Left lung clear.  IMPRESSION: No active cardiopulmonary disease.   Electronically Signed   By: Franchot Gallo M.D.   On: 08/11/2014 17:04    ASSESSMENT/PLAN:  Osteoarthritis status post left total knee arthroplasty - for home health PT and OT Hypertension - continue Lotrel and Tenoretic GERD - stable; continue Pepcid Hyperlipidemia - continue Crestor  I have filled out patient's discharge paperwork and written prescriptions.  Patient will receive home health PT and OT.  Total discharge time: Less than 30 minutes  Discharge time involved coordination of the discharge process with Education officer, museum, nursing staff and therapy department. Medical justification for home health services verified.  CPT CODE: 08022  MEDINA-VARGAS,Jozi Malachi, [facility] [phone]

## 2014-09-21 ENCOUNTER — Ambulatory Visit: Payer: Medicare Other | Admitting: Family

## 2014-10-02 ENCOUNTER — Ambulatory Visit: Payer: Medicare Other | Admitting: Family

## 2014-12-01 ENCOUNTER — Other Ambulatory Visit: Payer: Self-pay | Admitting: Family

## 2014-12-07 ENCOUNTER — Other Ambulatory Visit: Payer: Self-pay | Admitting: Family

## 2014-12-09 ENCOUNTER — Telehealth: Payer: Self-pay

## 2014-12-09 MED ORDER — ATENOLOL-CHLORTHALIDONE 50-25 MG PO TABS: 1.0000 | ORAL_TABLET | Freq: Every day | ORAL | Status: DC

## 2014-12-09 NOTE — Telephone Encounter (Signed)
Received a fax from [facility] for atenol/chlor 50-25 mg tablet-Take 1 tablet by mouth once daily #30  Pt last seen by 8.21.2015 by Roxy Cedar.  Pt has not established with a new PCP. Left a message for pt to return call to establish with new PCP. Rx sent to pharmacy for #30 with note to call office as well.

## 2014-12-10 ENCOUNTER — Ambulatory Visit: Payer: Medicare Other | Admitting: Family

## 2015-01-24 ENCOUNTER — Encounter (HOSPITAL_COMMUNITY): Payer: Self-pay | Admitting: Oncology

## 2015-01-24 ENCOUNTER — Emergency Department (HOSPITAL_COMMUNITY)
Admission: EM | Admit: 2015-01-24 | Discharge: 2015-01-24 | Disposition: A | Discharge status: Home / Self Care | Payer: Medicare Other | Attending: Emergency Medicine | Admitting: Emergency Medicine

## 2015-01-24 DIAGNOSIS — Z79899 Other long term (current) drug therapy: Secondary | ICD-10-CM | POA: Diagnosis not present

## 2015-01-24 DIAGNOSIS — Z8701 Personal history of pneumonia (recurrent): Secondary | ICD-10-CM | POA: Diagnosis not present

## 2015-01-24 DIAGNOSIS — E669 Obesity, unspecified: Secondary | ICD-10-CM | POA: Diagnosis not present

## 2015-01-24 DIAGNOSIS — M1990 Unspecified osteoarthritis, unspecified site: Secondary | ICD-10-CM | POA: Diagnosis not present

## 2015-01-24 DIAGNOSIS — Z8619 Personal history of other infectious and parasitic diseases: Secondary | ICD-10-CM | POA: Diagnosis not present

## 2015-01-24 DIAGNOSIS — K5900 Constipation, unspecified: Secondary | ICD-10-CM | POA: Insufficient documentation

## 2015-01-24 DIAGNOSIS — Z87440 Personal history of urinary (tract) infections: Secondary | ICD-10-CM | POA: Insufficient documentation

## 2015-01-24 DIAGNOSIS — E119 Type 2 diabetes mellitus without complications: Secondary | ICD-10-CM | POA: Diagnosis not present

## 2015-01-24 DIAGNOSIS — I10 Essential (primary) hypertension: Secondary | ICD-10-CM | POA: Diagnosis not present

## 2015-01-24 DIAGNOSIS — Z7901 Long term (current) use of anticoagulants: Secondary | ICD-10-CM | POA: Diagnosis not present

## 2015-01-24 DIAGNOSIS — Z7982 Long term (current) use of aspirin: Secondary | ICD-10-CM | POA: Diagnosis not present

## 2015-01-24 DIAGNOSIS — E785 Hyperlipidemia, unspecified: Secondary | ICD-10-CM | POA: Diagnosis not present

## 2015-01-24 DIAGNOSIS — Z86018 Personal history of other benign neoplasm: Secondary | ICD-10-CM | POA: Diagnosis not present

## 2015-01-24 DIAGNOSIS — Z86010 Personal history of colonic polyps: Secondary | ICD-10-CM | POA: Diagnosis not present

## 2015-01-24 DIAGNOSIS — R109 Unspecified abdominal pain: Secondary | ICD-10-CM | POA: Insufficient documentation

## 2015-01-24 LAB — URINALYSIS, ROUTINE W REFLEX MICROSCOPIC
Bilirubin Urine: NEGATIVE
Glucose, UA: NEGATIVE mg/dL
Hgb urine dipstick: NEGATIVE
Ketones, ur: NEGATIVE mg/dL
Leukocytes, UA: NEGATIVE
Nitrite: NEGATIVE
Protein, ur: NEGATIVE mg/dL
Specific Gravity, Urine: 1.009 (ref 1.005–1.030)
Urobilinogen, UA: 1 mg/dL (ref 0.0–1.0)
pH: 6 (ref 5.0–8.0)

## 2015-01-24 LAB — COMPREHENSIVE METABOLIC PANEL
ALT: 24 U/L (ref 0–35)
AST: 33 U/L (ref 0–37)
Albumin: 3.9 g/dL (ref 3.5–5.2)
Alkaline Phosphatase: 46 U/L (ref 39–117)
Anion gap: 9 (ref 5–15)
BUN: 32 mg/dL — ABNORMAL HIGH (ref 6–23)
CO2: 25 mmol/L (ref 19–32)
Calcium: 8.9 mg/dL (ref 8.4–10.5)
Chloride: 100 mmol/L (ref 96–112)
Creatinine, Ser: 1.18 mg/dL — ABNORMAL HIGH (ref 0.50–1.10)
GFR calc Af Amer: 52 mL/min — ABNORMAL LOW (ref >90)
GFR calc non Af Amer: 45 mL/min — ABNORMAL LOW (ref >90)
Glucose, Bld: 167 mg/dL — ABNORMAL HIGH (ref 70–99)
Potassium: 3.5 mmol/L (ref 3.5–5.1)
Sodium: 134 mmol/L — ABNORMAL LOW (ref 135–145)
Total Bilirubin: 1.1 mg/dL (ref 0.3–1.2)
Total Protein: 7.2 g/dL (ref 6.0–8.3)

## 2015-01-24 LAB — CBC
HCT: 41.1 % (ref 36.0–46.0)
Hemoglobin: 13.8 g/dL (ref 12.0–15.0)
MCH: 29.4 pg (ref 26.0–34.0)
MCHC: 33.6 g/dL (ref 30.0–36.0)
MCV: 87.4 fL (ref 78.0–100.0)
Platelets: 236 10*3/uL (ref 150–400)
RBC: 4.7 MIL/uL (ref 3.87–5.11)
RDW: 13.5 % (ref 11.5–15.5)
WBC: 12.5 10*3/uL — ABNORMAL HIGH (ref 4.0–10.5)

## 2015-01-24 MED ORDER — TRAMADOL HCL 50 MG PO TABS: 50.0000 mg | ORAL_TABLET | Freq: Four times a day (QID) | ORAL | Status: DC

## 2015-01-24 MED ORDER — METHOCARBAMOL 500 MG PO TABS
500.0000 mg | ORAL_TABLET | Freq: Once | ORAL | Status: AC
  Administered 2015-01-24: 500 mg via ORAL
  Filled 2015-01-24: qty 1

## 2015-01-24 MED ORDER — METHOCARBAMOL 500 MG PO TABS: 500.0000 mg | ORAL_TABLET | Freq: Three times a day (TID) | ORAL | Status: DC

## 2015-01-24 MED ORDER — TRAMADOL HCL 50 MG PO TABS
50.0000 mg | ORAL_TABLET | Freq: Once | ORAL | Status: DC
  Filled 2015-01-24: qty 1

## 2015-01-24 NOTE — Discharge Instructions (Signed)
You're being treated for flank pain which is sharp and stabbing, catching in nature.  Your urine does not show any sign of in direction, your bloodwork is normal as well.  Please follow-up with your primary care physician or return for further evaluation if you not getting any relief from the prescribed medications

## 2015-01-24 NOTE — ED Notes (Signed)
Pt reports urinary burning x 2 days and today began to have right flank pain.  Pain is intermittent worse with movement.  Denies fever, nausea or vomiting.

## 2015-01-24 NOTE — ED Provider Notes (Signed)
CSN: [id number]     Arrival date & time 01/24/15  0001 History   First MD Initiated Contact with Patient 01/24/15 0034     Chief Complaint  Patient presents with  . Flank Pain     (Consider location/radiation/quality/duration/timing/severity/associated sxs/prior Treatment) Patient is a 74 y.o. female presenting with flank pain. The history is provided by the patient.  Flank Pain This is a recurrent problem. The current episode started yesterday. The problem occurs constantly. The problem has been unchanged. Pertinent negatives include no abdominal pain, change in bowel habit, chest pain, chills, congestion, coughing, fever, myalgias, nausea, numbness, rash, urinary symptoms, vomiting or weakness. The symptoms are aggravated by twisting and walking. She has tried nothing for the symptoms. The treatment provided no relief.    Past Medical History  Diagnosis Date  . Hyperlipidemia   . Hypertension   . Obesity   . Diverticulitis hx of  . Diverticulosis   . Abdominal pain   . Constipation   . Colon adenoma   . GERD (gastroesophageal reflux disease)   . Pneumonia 2005  . Numbness and tingling in right hand     pt. states has numbness of right hand very frequently-watch positioning  . Personal history of colonic polyps-adenoma 08/26/2008  . H/O hiatal hernia   . Frequent UTI     hx of urethral injury during colon surgery - states frequent uti's since  . History of shingles     has a lingering itching on back where shingles were  . Pain     pain left knee and pain right hip and right groin  . Osteoporosis   . History of palpitations     in the past  . Arthritis     cervical disc degeneration/ oa left knee, carpal tunnel rt wrist, adhesive capsulitis right shoulder, rt hand weakness; lumbar degeneration  . Complication of anesthesia 2006-at [facility]    breathing problems-no BP med given prior to surgery;  hx of being very sleepy after colon surgery --  states no problems with last  right total knee replacement 2013  . Diabetes mellitus without complication     borderline - diet control   Past Surgical History  Procedure Laterality Date  . Abdominal hysterectomy    . Bladder tack    . Colonoscopy [id number]    . Oophorectomy    . Ureter repair for tyransected left ureter    . Temporary ureter stent    . 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 Joint replacement    . Colon resection  2008    hx diverticulosis  . Carpal tunnel release Right 06/04/2014    Procedure: RIGHT CARPAL TUNNEL RELEASE;  Surgeon: Roseanne Kaufman, MD;  Location: [place];  Service: Orthopedics;  Laterality: Right;  . Appendectomy    . Breast surgery      breast duct resection- benign  . Colon surgery    . Skin graft left arm - traumatic compression injury left upper arm    . Total knee arthroplasty Left 08/17/2014    Procedure: LEFT TOTAL KNEE ARTHROPLASTY;  Surgeon: [name], MD;  Location: [facility];  Service: Orthopedics;  Laterality: Left;   Family History  Problem Relation Age of Onset  . Breast cancer Mother   . Prostate cancer Father   . Colon cancer Father 68  History  Substance Use Topics  . Smoking status: Never Smoker   . Smokeless tobacco: Never Used  . Alcohol Use: No   OB History    No data available     Review of Systems  Constitutional: Negative for fever and chills.  HENT: Negative for congestion.   Respiratory: Negative for cough.   Cardiovascular: Negative for chest pain.  Gastrointestinal: Negative for nausea, vomiting, abdominal pain, constipation and change in bowel habit.  Genitourinary: Positive for flank pain. Negative for dysuria, frequency and hematuria.  Musculoskeletal: Positive for back pain. Negative for myalgias.  Skin: Negative for rash.  Neurological: Negative for weakness and numbness.   All other systems reviewed and are negative.     Allergies  Bactrim; Lipitor; Other; Oxycodone; and Vicodin  Home Medications   Prior to Admission medications   Medication Sig Start Date End Date Taking? Authorizing Provider  amLODipine-benazepril (LOTREL) 5-20 MG per capsule Take 1 capsule by mouth daily. @@ 11 am.   Yes Historical Provider, MD  aspirin EC 81 MG tablet Take 81 mg by mouth daily.   Yes Historical Provider, MD  atenolol-chlorthalidone (TENORETIC) 50-25 MG per tablet Take 1 tablet by mouth daily. @@ 11 am 12/09/14  Yes Timoteo Gaul, FNP  famotidine (PEPCID) 20 MG tablet Take 20 mg by mouth daily. @ 11 am   Yes Historical Provider, MD  rosuvastatin (CRESTOR) 20 MG tablet Take 20 mg by mouth 3 (three) times a week.  02/24/11  Yes Ricard Dillon, MD  acetaminophen (TYLENOL) 325 MG tablet Take 2 tablets (650 mg total) by mouth every 6 (six) hours as needed for mild pain (or Fever >/= 101). Patient not taking: Reported on 01/24/2015 08/18/14   Arlee Muslim, PA-C  bisacodyl (DULCOLAX) 10 MG suppository Place 1 suppository (10 mg total) rectally daily as needed for moderate constipation. Patient not taking: Reported on 01/24/2015 08/18/14   Arlee Muslim, PA-C  docusate sodium 100 MG CAPS Take 100 mg by mouth 2 (two) times daily. Patient not taking: Reported on 01/24/2015 08/18/14   Arlee Muslim, PA-C  HYDROmorphone (DILAUDID) 2 MG tablet Take 1-2 tablets (2-4 mg total) by mouth every 4 (four) hours as needed for moderate pain or severe pain. Patient not taking: Reported on 01/24/2015 08/18/14   Arlee Muslim, PA-C  indomethacin (INDOCIN) 50 MG capsule TAKE ONE CAPSULE BY MOUTH THREE TIMES DAILY WITH MEALS 12/07/14   Timoteo Gaul, FNP  methocarbamol (ROBAXIN) 500 MG tablet Take 1 tablet (500 mg total) by mouth 3 (three) times daily. 01/24/15   Junius Creamer, NP  metoCLOPramide (REGLAN) 5 MG tablet Take 1 tablet (5 mg total) by mouth every 8 (eight) hours as needed for nausea (if  ondansetron (ZOFRAN) ineffective.). Patient not taking: Reported on 01/24/2015 08/18/14   Arlee Muslim, PA-C  ondansetron (ZOFRAN) 4 MG tablet Take 1 tablet (4 mg total) by mouth every 6 (six) hours as needed for nausea. Patient not taking: Reported on 01/24/2015 08/18/14   Arlee Muslim, PA-C  [facility] DELICA LANCETS 55D MISC Use to check blood glucose once daily 06/19/14   Timoteo Gaul, FNP  polyethylene glycol (MIRALAX / GLYCOLAX) packet Take 17 g by mouth daily as needed for mild constipation. Patient not taking: Reported on 01/24/2015 08/18/14   Arlee Muslim, PA-C  rivaroxaban (XARELTO) 10 MG TABS tablet Take 1 tablet (10 mg total) by mouth daily with breakfast. Take Xarelto for two and a half more weeks, then discontinue Xarelto. Once the patient has  completed the Xarelto, they may resume the 81 mg Aspirin. Patient not taking: Reported on 01/24/2015 08/18/14   Arlee Muslim, PA-C  traMADol (ULTRAM) 50 MG tablet Take 1 tablet (50 mg total) by mouth 4 (four) times daily. 01/24/15   Junius Creamer, NP   BP 132/62 mmHg  Pulse 73  Temp(Src) 98 F (36.7 C) (Oral)  Resp 16  Ht 5\' 8"  (1.727 m)  Wt 257 lb (116.574 kg)  BMI 39.09 kg/m2  SpO2 99% Physical Exam  Constitutional: She is oriented to person, place, and time. She appears well-developed and well-nourished.  HENT:  Head: Normocephalic.  Right Ear: External ear normal.  Left Ear: External ear normal.  Eyes: Pupils are equal, round, and reactive to light.  Neck: Normal range of motion.  Cardiovascular: Normal rate and regular rhythm.   Pulmonary/Chest: Effort normal and breath sounds normal.  Abdominal: Soft. Bowel sounds are normal. She exhibits no distension. There is no tenderness.  Musculoskeletal: Normal range of motion. She exhibits no edema or tenderness.  Neurological: She is alert and oriented to person, place, and time.  Skin: Skin is warm and dry. No rash noted.  Nursing note and vitals reviewed.   ED Course  Procedures  (including critical care time) Labs Review Labs Reviewed  CBC - Abnormal; Notable for the following:    WBC 12.5 (*)    All other components within normal limits  COMPREHENSIVE METABOLIC PANEL - Abnormal; Notable for the following:    Sodium 134 (*)    Glucose, Bld 167 (*)    BUN 32 (*)    Creatinine, Ser 1.18 (*)    GFR calc non Af Amer 45 (*)    GFR calc Af Amer 52 (*)    All other components within normal limits  URINALYSIS, ROUTINE W REFLEX MICROSCOPIC    Imaging Review No results found.   EKG Interpretation None     patient's urine is normal.  Her lab work is essentially normal as well.  She is being treated for the sharp, stabbing, catching, right flank pain which I feel is muscular in nature.  At this point.  She has been given a prescription for Ultram as well as Robaxin, both of which she has taken in the past without adverse side effects, and instructed to follow-up with her primary care physician as needed  MDM   Final diagnoses:  Acute right flank pain        Junius Creamer, NP 01/24/15 6754  Shanon Rosser, MD 01/24/15 [phone]

## 2015-03-23 ENCOUNTER — Other Ambulatory Visit: Payer: Self-pay

## 2015-03-23 DIAGNOSIS — Z1231 Encounter for screening mammogram for malignant neoplasm of breast: Secondary | ICD-10-CM

## 2015-03-30 ENCOUNTER — Encounter: Payer: Self-pay | Admitting: Gastroenterology

## 2015-03-30 ENCOUNTER — Encounter: Payer: Self-pay | Admitting: Internal Medicine

## 2015-04-27 ENCOUNTER — Ambulatory Visit
Admission: RE | Admit: 2015-04-27 | Discharge: 2015-04-27 | Disposition: A | Discharge status: Home / Self Care | Payer: Medicare Other | Source: Ambulatory Visit

## 2015-04-27 DIAGNOSIS — Z1231 Encounter for screening mammogram for malignant neoplasm of breast: Secondary | ICD-10-CM

## 2015-07-22 ENCOUNTER — Other Ambulatory Visit: Payer: Self-pay | Admitting: Family

## 2015-10-24 ENCOUNTER — Emergency Department (HOSPITAL_COMMUNITY)
Admission: EM | Admit: 2015-10-24 | Discharge: 2015-10-24 | Disposition: A | Discharge status: Home / Self Care | Payer: Medicare Other | Attending: Emergency Medicine | Admitting: Emergency Medicine

## 2015-10-24 ENCOUNTER — Encounter (HOSPITAL_COMMUNITY): Payer: Self-pay | Admitting: Emergency Medicine

## 2015-10-24 DIAGNOSIS — K219 Gastro-esophageal reflux disease without esophagitis: Secondary | ICD-10-CM | POA: Diagnosis not present

## 2015-10-24 DIAGNOSIS — I10 Essential (primary) hypertension: Secondary | ICD-10-CM | POA: Insufficient documentation

## 2015-10-24 DIAGNOSIS — M1990 Unspecified osteoarthritis, unspecified site: Secondary | ICD-10-CM | POA: Insufficient documentation

## 2015-10-24 DIAGNOSIS — S61215A Laceration without foreign body of left ring finger without damage to nail, initial encounter: Secondary | ICD-10-CM | POA: Insufficient documentation

## 2015-10-24 DIAGNOSIS — Z85038 Personal history of other malignant neoplasm of large intestine: Secondary | ICD-10-CM | POA: Diagnosis not present

## 2015-10-24 DIAGNOSIS — K5900 Constipation, unspecified: Secondary | ICD-10-CM | POA: Diagnosis not present

## 2015-10-24 DIAGNOSIS — E669 Obesity, unspecified: Secondary | ICD-10-CM | POA: Diagnosis not present

## 2015-10-24 DIAGNOSIS — Z8701 Personal history of pneumonia (recurrent): Secondary | ICD-10-CM | POA: Insufficient documentation

## 2015-10-24 DIAGNOSIS — Z87440 Personal history of urinary (tract) infections: Secondary | ICD-10-CM | POA: Diagnosis not present

## 2015-10-24 DIAGNOSIS — Y9289 Other specified places as the place of occurrence of the external cause: Secondary | ICD-10-CM | POA: Insufficient documentation

## 2015-10-24 DIAGNOSIS — Z86010 Personal history of colonic polyps: Secondary | ICD-10-CM | POA: Insufficient documentation

## 2015-10-24 DIAGNOSIS — Y9389 Activity, other specified: Secondary | ICD-10-CM | POA: Diagnosis not present

## 2015-10-24 DIAGNOSIS — E785 Hyperlipidemia, unspecified: Secondary | ICD-10-CM | POA: Insufficient documentation

## 2015-10-24 DIAGNOSIS — W268XXA Contact with other sharp object(s), not elsewhere classified, initial encounter: Secondary | ICD-10-CM | POA: Insufficient documentation

## 2015-10-24 DIAGNOSIS — Z79899 Other long term (current) drug therapy: Secondary | ICD-10-CM | POA: Insufficient documentation

## 2015-10-24 DIAGNOSIS — Z7982 Long term (current) use of aspirin: Secondary | ICD-10-CM | POA: Diagnosis not present

## 2015-10-24 DIAGNOSIS — Y998 Other external cause status: Secondary | ICD-10-CM | POA: Diagnosis not present

## 2015-10-24 DIAGNOSIS — Z8619 Personal history of other infectious and parasitic diseases: Secondary | ICD-10-CM | POA: Diagnosis not present

## 2015-10-24 DIAGNOSIS — S61219A Laceration without foreign body of unspecified finger without damage to nail, initial encounter: Secondary | ICD-10-CM

## 2015-10-24 DIAGNOSIS — E119 Type 2 diabetes mellitus without complications: Secondary | ICD-10-CM | POA: Insufficient documentation

## 2015-10-24 MED ORDER — LIDOCAINE HCL 1 % IJ SOLN
5.0000 mL | Freq: Once | INTRAMUSCULAR | Status: DC
Start: 2015-10-24 — End: 2015-10-24
  Filled 2015-10-24: qty 20

## 2015-10-24 NOTE — ED Provider Notes (Signed)
CSN: [id number]     Arrival date & time 10/24/15  1849 History  By signing my name below, I, [facility], attest that this documentation has been prepared under the direction and in the presence of [employer], PA-C. Electronically Signed: Virgel Bouquet, ED Scribe. 10/24/2015. 7:18 PM.   Chief Complaint  Patient presents with  . finger laceration    The history is provided by the patient. No language interpreter was used.   HPI Comments: [name] is a 74 y.o. female who presents to the Emergency Department after a left hand injury that occurred 9 hours ago. Patient reports that she was opening a metal can when she lacerated her left ring finger on the palmar side, on the sharp edge of the can . She wrapped the site with a bandage immediately following injury and continued normal activity until arrival in ED. She denies an hx of DM but notes that she is borderline. She denies numbness, tingling, and reduced ROM. Tetanus UTD.   Past Medical History  Diagnosis Date  . Hyperlipidemia   . Hypertension   . Obesity   . Diverticulitis hx of  . Diverticulosis   . Abdominal pain   . Constipation   . Colon adenoma   . GERD (gastroesophageal reflux disease)   . Pneumonia 2005  . Numbness and tingling in right hand     pt. states has numbness of right hand very frequently-watch positioning  . Personal history of colonic polyps-adenoma 08/26/2008  . H/O hiatal hernia   . Frequent UTI     hx of urethral injury during colon surgery - states frequent uti's since  . History of shingles     has a lingering itching on back where shingles were  . Pain     pain left knee and pain right hip and right groin  . Osteoporosis   . History of palpitations     in the past  . Arthritis     cervical disc degeneration/ oa left knee, carpal tunnel rt wrist, adhesive capsulitis right shoulder, rt hand weakness; lumbar degeneration  . Complication of anesthesia 2006-at [facility]     breathing problems-no BP med given prior to surgery;  hx of being very sleepy after colon surgery --  states no problems with last right total knee replacement 2013  . Diabetes mellitus without complication ([place])     borderline - diet control   Past Surgical History  Procedure Laterality Date  . Abdominal hysterectomy    . Bladder tack    . Colonoscopy [id number]    . Oophorectomy    . Ureter repair for tyransected left ureter    . Temporary ureter stent    . 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 Joint replacement    . Colon resection  2008    hx diverticulosis  . Carpal tunnel release Right 06/04/2014    Procedure: RIGHT CARPAL TUNNEL RELEASE;  Surgeon: Roseanne Kaufman, MD;  Location: [place];  Service: Orthopedics;  Laterality: Right;  . Appendectomy    . Breast surgery      breast duct resection- benign  . Colon surgery    . Skin graft left arm - traumatic compression injury left upper arm    . Total knee arthroplasty Left 08/17/2014    Procedure: LEFT TOTAL KNEE ARTHROPLASTY;  Surgeon: [name],  MD;  Location: [facility];  Service: Orthopedics;  Laterality: Left;   Family History  Problem Relation Age of Onset  . Breast cancer Mother   . Prostate cancer Father   . Colon cancer Father 69   Social History  Substance Use Topics  . Smoking status: Never Smoker   . Smokeless tobacco: Never Used  . Alcohol Use: No   OB History    No data available     Review of Systems A complete 10 system review of systems was obtained and all systems are negative except as noted in the HPI and PMH.    Allergies  Bactrim; Lipitor; Other; Oxycodone; and Vicodin  Home Medications   Prior to Admission medications   Medication Sig Start Date End Date Taking? Authorizing Provider  acetaminophen (TYLENOL) 325 MG tablet Take 2 tablets  (650 mg total) by mouth every 6 (six) hours as needed for mild pain (or Fever >/= 101). Patient not taking: Reported on 01/24/2015 08/18/14   Arlee Muslim, PA-C  amLODipine-benazepril (LOTREL) 5-20 MG per capsule Take 1 capsule by mouth daily. @@ 11 am.    Historical Provider, MD  aspirin EC 81 MG tablet Take 81 mg by mouth daily.    Historical Provider, MD  atenolol-chlorthalidone (TENORETIC) 50-25 MG per tablet Take 1 tablet by mouth daily. @@ 11 am 12/09/14   Kennyth Arnold, FNP  bisacodyl (DULCOLAX) 10 MG suppository Place 1 suppository (10 mg total) rectally daily as needed for moderate constipation. Patient not taking: Reported on 01/24/2015 08/18/14   Arlee Muslim, PA-C  docusate sodium 100 MG CAPS Take 100 mg by mouth 2 (two) times daily. Patient not taking: Reported on 01/24/2015 08/18/14   Arlee Muslim, PA-C  famotidine (PEPCID) 20 MG tablet Take 20 mg by mouth daily. @ 11 am    Historical Provider, MD  HYDROmorphone (DILAUDID) 2 MG tablet Take 1-2 tablets (2-4 mg total) by mouth every 4 (four) hours as needed for moderate pain or severe pain. Patient not taking: Reported on 01/24/2015 08/18/14   Arlee Muslim, PA-C  indomethacin (INDOCIN) 50 MG capsule TAKE ONE CAPSULE BY MOUTH THREE TIMES DAILY WITH MEALS 12/07/14   Kennyth Arnold, FNP  methocarbamol (ROBAXIN) 500 MG tablet Take 1 tablet (500 mg total) by mouth 3 (three) times daily. 01/24/15   Junius Creamer, NP  metoCLOPramide (REGLAN) 5 MG tablet Take 1 tablet (5 mg total) by mouth every 8 (eight) hours as needed for nausea (if ondansetron (ZOFRAN) ineffective.). Patient not taking: Reported on 01/24/2015 08/18/14   Arlee Muslim, PA-C  ondansetron (ZOFRAN) 4 MG tablet Take 1 tablet (4 mg total) by mouth every 6 (six) hours as needed for nausea. Patient not taking: Reported on 01/24/2015 08/18/14   Arlee Muslim, PA-C  [facility] DELICA LANCETS 99991111 MISC Use to check blood glucose once daily 06/19/14   Kennyth Arnold, FNP  polyethylene glycol (MIRALAX  / GLYCOLAX) packet Take 17 g by mouth daily as needed for mild constipation. Patient not taking: Reported on 01/24/2015 08/18/14   Arlee Muslim, PA-C  rivaroxaban (XARELTO) 10 MG TABS tablet Take 1 tablet (10 mg total) by mouth daily with breakfast. Take Xarelto for two and a half more weeks, then discontinue Xarelto. Once the patient has completed the Xarelto, they may resume the 81 mg Aspirin. Patient not taking: Reported on 01/24/2015 08/18/14   Arlee Muslim, PA-C  rosuvastatin (CRESTOR) 20 MG tablet Take 20 mg by mouth 3 (three) times a week.  02/24/11  Ricard Dillon, MD  traMADol (ULTRAM) 50 MG tablet Take 1 tablet (50 mg total) by mouth 4 (four) times daily. 01/24/15   Junius Creamer, NP   BP 161/78 mmHg  Pulse 68  Temp(Src) 97.7 F (36.5 C) (Oral)  Resp 23  SpO2 97% Physical Exam  Constitutional: She is oriented to person, place, and time. She appears well-developed and well-nourished. No distress.  HENT:  Head: Normocephalic.  Eyes: Conjunctivae and EOM are normal.  Cardiovascular: Normal rate.   Pulmonary/Chest: Effort normal. No stridor.  Musculoskeletal: Normal range of motion.  Neurological: She is alert and oriented to person, place, and time.  Skin:  1.5 cm full-thickness laceration on the radial aspect of the left fourth digit DIP, it does not extend into the joint, bleeding is controlled, patient is distally neurovascularly intact with excellent range of motion and full sensation  Psychiatric: She has a normal mood and affect.  Nursing note and vitals reviewed.   ED Course  .Marland KitchenLaceration Repair Date/Time: 10/24/2015 7:39 PM Performed by: Monico Blitz Authorized by: Monico Blitz Consent: Verbal consent obtained. Consent given by: patient Patient identity confirmed: verbally with patient Body area: upper extremity Location details: left ring finger Laceration length: 1.5 cm Foreign bodies: no foreign bodies Tendon involvement: none Nerve involvement:  none Vascular damage: no Anesthesia: digital block Local anesthetic: lidocaine 1% without epinephrine Anesthetic total: 5 ml Patient sedated: no Preparation: Patient was prepped and draped in the usual sterile fashion. Irrigation solution: saline Irrigation method: syringe Amount of cleaning: extensive Debridement: none Degree of undermining: none Wound skin closure material used: 4-0 Vicryl. Number of sutures: 4 Technique: simple Approximation: close Approximation difficulty: simple Dressing: antibiotic ointment Patient tolerance: Patient tolerated the procedure well with no immediate complications    DIAGNOSTIC STUDIES: Oxygen Saturation is 97% on RA, normal by my interpretation.    COORDINATION OF CARE: 7:03 PM Will perform laceration repair. Will order finger splint. Discussed treatment plan with pt at bedside and pt agreed to plan.  Labs Review Labs Reviewed - No data to display  Imaging Review No results found. I have personally reviewed and evaluated these images and lab results as part of my medical decision-making.   EKG Interpretation None      MDM   Final diagnoses:  Finger laceration, initial encounter    Filed Vitals:   10/24/15 1900  BP: 161/78  Pulse: 68  Temp: 97.7 F (36.5 C)  TempSrc: Oral  Resp: 23  SpO2: 97%    Medications  lidocaine (XYLOCAINE) 1 % (with pres) injection 5 mL (not administered)    [name] is 74 y.o. female presenting with finger laceration.  No signs of tendon/joint involvement. Tdap UTD. Pressure irrigation performed. Laceration occurred < 8 hours prior to repair which was well tolerated. Pt has no co morbidities to effect normal wound healing. Discussed suture home care w pt and answered questions. Pt is hemodynamically stable with no complaints prior to dc.    Evaluation does not show pathology that would require ongoing emergent intervention or inpatient treatment. Pt is hemodynamically stable and  mentating appropriately. Discussed findings and plan with patient/guardian, who agrees with care plan. All questions answered. Return precautions discussed and outpatient follow up given.   I personally performed the services described in this documentation, which was scribed in my presence. The recorded information has been reviewed and is accurate.    Monico Blitz, PA-C 10/24/15 1946  Charlesetta Shanks, MD 11/06/15 1004

## 2015-10-24 NOTE — Discharge Instructions (Signed)
Use your finger splint for the first 2 days  Keep wound dry and do not remove dressing for 24 hours if possible. After that, wash gently morning and night (every 12 hours) with soap and water. Use a topical antibiotic ointment and cover with a bandaid or gauze.    Do NOT use rubbing alcohol or hydrogen peroxide, do not soak the area   After 8 days you can try to gently remove the outer part of the suture material with a tweezers. Your suture material is dissolving but the outside will not resolve.   Every attempt was made to remove foreign body (contaminants) from the wound.  However, there is always a chance that some may remain in the wound. This can  increase your risk of infection.   If you see signs of infection (warmth, redness, tenderness, pus, sharp increase in pain, fever, red streaking in the skin) immediately return to the emergency department.   After the wound heals fully, apply sunscreen for 6-12 months to minimize scarring.    Laceration Care, Adult A laceration is a cut that goes through all of the layers of the skin and into the tissue that is right under the skin. Some lacerations heal on their own. Others need to be closed with stitches (sutures), staples, skin adhesive strips, or skin glue. Proper laceration care minimizes the risk of infection and helps the laceration to heal better. HOW TO CARE FOR YOUR LACERATION If sutures or staples were used:  Keep the wound clean and dry.  If you were given a bandage (dressing), you should change it at least one time per day or as told by your health care provider. You should also change it if it becomes wet or dirty.  Keep the wound completely dry for the first 24 hours or as told by your health care provider. After that time, you may shower or bathe. However, make sure that the wound is not soaked in water until after the sutures or staples have been removed.  Clean the wound one time each day or as told by your health care  provider:  Wash the wound with soap and water.  Rinse the wound with water to remove all soap.  Pat the wound dry with a clean towel. Do not rub the wound.  After cleaning the wound, apply a thin layer of antibiotic ointmentas told by your health care provider. This will help to prevent infection and keep the dressing from sticking to the wound.  Have the sutures or staples removed as told by your health care provider. If skin adhesive strips were used:  Keep the wound clean and dry.  If you were given a bandage (dressing), you should change it at least one time per day or as told by your health care provider. You should also change it if it becomes dirty or wet.  Do not get the skin adhesive strips wet. You may shower or bathe, but be careful to keep the wound dry.  If the wound gets wet, pat it dry with a clean towel. Do not rub the wound.  Skin adhesive strips fall off on their own. You may trim the strips as the wound heals. Do not remove skin adhesive strips that are still stuck to the wound. They will fall off in time. If skin glue was used:  Try to keep the wound dry, but you may briefly wet it in the shower or bath. Do not soak the wound in water, such  as by swimming.  After you have showered or bathed, gently pat the wound dry with a clean towel. Do not rub the wound.  Do not do any activities that will make you sweat heavily until the skin glue has fallen off on its own.  Do not apply liquid, cream, or ointment medicine to the wound while the skin glue is in place. Using those may loosen the film before the wound has healed.  If you were given a bandage (dressing), you should change it at least one time per day or as told by your health care provider. You should also change it if it becomes dirty or wet.  If a dressing is placed over the wound, be careful not to apply tape directly over the skin glue. Doing that may cause the glue to be pulled off before the wound has  healed.  Do not pick at the glue. The skin glue usually remains in place for 5-10 days, then it falls off of the skin. General Instructions  Take over-the-counter and prescription medicines only as told by your health care provider.  If you were prescribed an antibiotic medicine or ointment, take or apply it as told by your doctor. Do not stop using it even if your condition improves.  To help prevent scarring, make sure to cover your wound with sunscreen whenever you are outside after stitches are removed, after adhesive strips are removed, or when glue remains in place and the wound is healed. Make sure to wear a sunscreen of at least 30 SPF.  Do not scratch or pick at the wound.  Keep all follow-up visits as told by your health care provider. This is important.  Check your wound every day for signs of infection. Watch for:  Redness, swelling, or pain.  Fluid, blood, or pus.  Raise (elevate) the injured area above the level of your heart while you are sitting or lying down, if possible. SEEK MEDICAL CARE IF:  You received a tetanus shot and you have swelling, severe pain, redness, or bleeding at the injection site.  You have a fever.  A wound that was closed breaks open.  You notice a bad smell coming from your wound or your dressing.  You notice something coming out of the wound, such as wood or glass.  Your pain is not controlled with medicine.  You have increased redness, swelling, or pain at the site of your wound.  You have fluid, blood, or pus coming from your wound.  You notice a change in the color of your skin near your wound.  You need to change the dressing frequently due to fluid, blood, or pus draining from the wound.  You develop a new rash.  You develop numbness around the wound. SEEK IMMEDIATE MEDICAL CARE IF:  You develop severe swelling around the wound.  Your pain suddenly increases and is severe.  You develop painful lumps near the wound or  on skin that is anywhere on your body.  You have a red streak going away from your wound.  The wound is on your hand or foot and you cannot properly move a finger or toe.  The wound is on your hand or foot and you notice that your fingers or toes look pale or bluish.   This information is not intended to replace advice given to you by your health care provider. Make sure you discuss any questions you have with your health care provider.   Document Released: 10/16/2005 Document  Revised: 03/02/2015 Document Reviewed: 10/12/2014 Elsevier Interactive Patient Education [employer].

## 2015-10-24 NOTE — ED Notes (Signed)
Per pt, states she cut left ring finger on a can of green beans

## 2015-12-09 ENCOUNTER — Other Ambulatory Visit: Payer: Self-pay | Admitting: Family

## 2015-12-14 ENCOUNTER — Other Ambulatory Visit: Payer: Self-pay | Admitting: Family

## 2015-12-16 ENCOUNTER — Telehealth: Payer: Self-pay | Admitting: Internal Medicine

## 2015-12-16 NOTE — Telephone Encounter (Signed)
Called [facility] to confirm if patient has transferred care to their facility.  The receptionist confirmed patient is an established patient there and see Dr. [name].  Refill request received from [facility] will be faxed to their office.

## 2015-12-29 DIAGNOSIS — H0264 Xanthelasma of left upper eyelid: Secondary | ICD-10-CM | POA: Diagnosis not present

## 2015-12-29 DIAGNOSIS — H25813 Combined forms of age-related cataract, bilateral: Secondary | ICD-10-CM | POA: Diagnosis not present

## 2016-01-04 ENCOUNTER — Emergency Department (HOSPITAL_COMMUNITY)
Admission: EM | Admit: 2016-01-04 | Discharge: 2016-01-04 | Disposition: A | Discharge status: Home / Self Care | Payer: Medicare Other | Attending: Emergency Medicine | Admitting: Emergency Medicine

## 2016-01-04 ENCOUNTER — Encounter (HOSPITAL_COMMUNITY): Payer: Self-pay | Admitting: Emergency Medicine

## 2016-01-04 ENCOUNTER — Emergency Department (HOSPITAL_COMMUNITY): Payer: Medicare Other

## 2016-01-04 DIAGNOSIS — M1990 Unspecified osteoarthritis, unspecified site: Secondary | ICD-10-CM | POA: Insufficient documentation

## 2016-01-04 DIAGNOSIS — R600 Localized edema: Secondary | ICD-10-CM | POA: Diagnosis not present

## 2016-01-04 DIAGNOSIS — Y9389 Activity, other specified: Secondary | ICD-10-CM | POA: Diagnosis not present

## 2016-01-04 DIAGNOSIS — S99921A Unspecified injury of right foot, initial encounter: Secondary | ICD-10-CM | POA: Insufficient documentation

## 2016-01-04 DIAGNOSIS — Z8619 Personal history of other infectious and parasitic diseases: Secondary | ICD-10-CM | POA: Diagnosis not present

## 2016-01-04 DIAGNOSIS — I10 Essential (primary) hypertension: Secondary | ICD-10-CM | POA: Diagnosis not present

## 2016-01-04 DIAGNOSIS — W208XXA Other cause of strike by thrown, projected or falling object, initial encounter: Secondary | ICD-10-CM | POA: Insufficient documentation

## 2016-01-04 DIAGNOSIS — Y998 Other external cause status: Secondary | ICD-10-CM | POA: Insufficient documentation

## 2016-01-04 DIAGNOSIS — Z85038 Personal history of other malignant neoplasm of large intestine: Secondary | ICD-10-CM | POA: Insufficient documentation

## 2016-01-04 DIAGNOSIS — Z87440 Personal history of urinary (tract) infections: Secondary | ICD-10-CM | POA: Insufficient documentation

## 2016-01-04 DIAGNOSIS — E785 Hyperlipidemia, unspecified: Secondary | ICD-10-CM | POA: Diagnosis not present

## 2016-01-04 DIAGNOSIS — Z79899 Other long term (current) drug therapy: Secondary | ICD-10-CM | POA: Diagnosis not present

## 2016-01-04 DIAGNOSIS — Y9289 Other specified places as the place of occurrence of the external cause: Secondary | ICD-10-CM | POA: Insufficient documentation

## 2016-01-04 DIAGNOSIS — Z8701 Personal history of pneumonia (recurrent): Secondary | ICD-10-CM | POA: Insufficient documentation

## 2016-01-04 DIAGNOSIS — K219 Gastro-esophageal reflux disease without esophagitis: Secondary | ICD-10-CM | POA: Diagnosis not present

## 2016-01-04 DIAGNOSIS — E669 Obesity, unspecified: Secondary | ICD-10-CM | POA: Diagnosis not present

## 2016-01-04 DIAGNOSIS — M7989 Other specified soft tissue disorders: Secondary | ICD-10-CM | POA: Diagnosis not present

## 2016-01-04 DIAGNOSIS — M79671 Pain in right foot: Secondary | ICD-10-CM | POA: Diagnosis not present

## 2016-01-04 DIAGNOSIS — M25571 Pain in right ankle and joints of right foot: Secondary | ICD-10-CM | POA: Diagnosis not present

## 2016-01-04 IMAGING — CR DG ANKLE COMPLETE 3+V*R*
3 series · 3 of 3 positions shown · non-contrast
Comparison: None.

CLINICAL DATA: Pain and swelling of the right foot after injury on
[DATE].

EXAM:
RIGHT ANKLE - COMPLETE 3+ VIEW

[x ankle lat right]
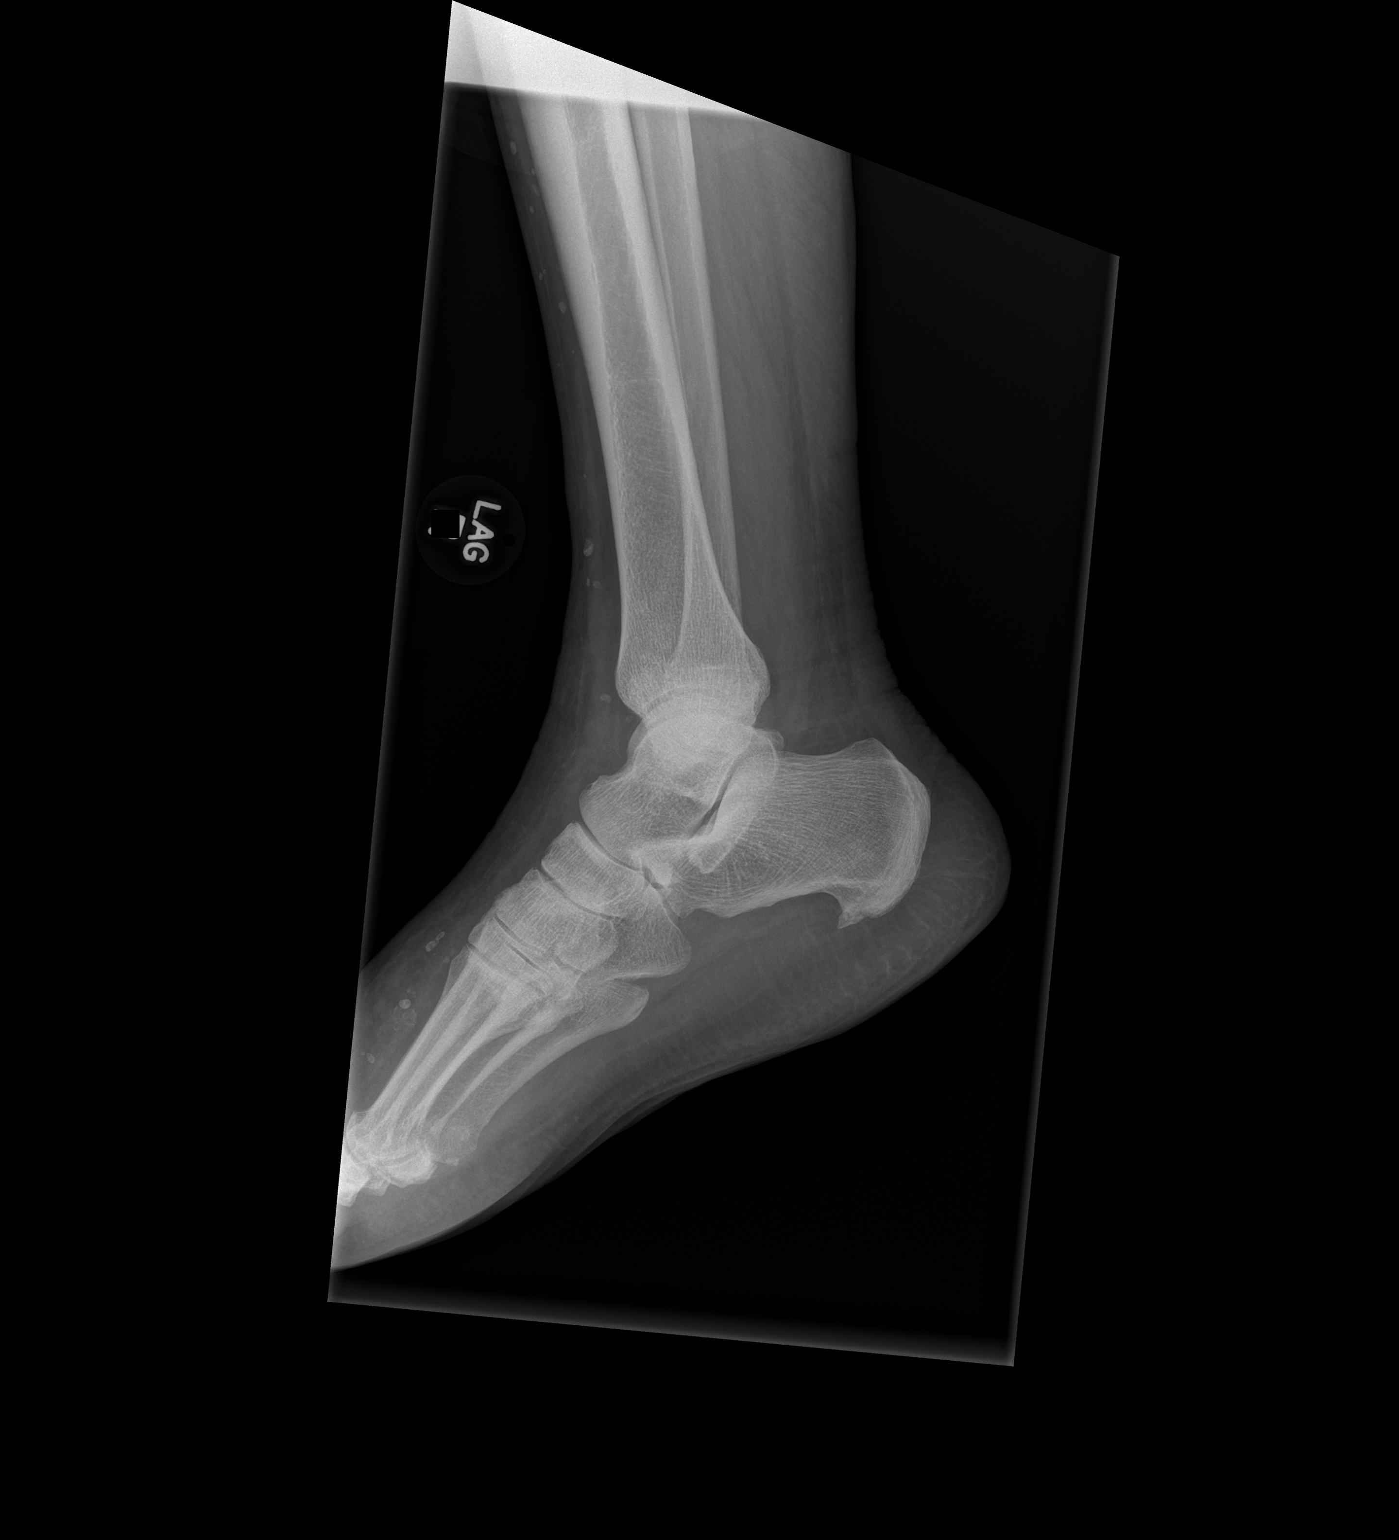

[x ankle ap right]
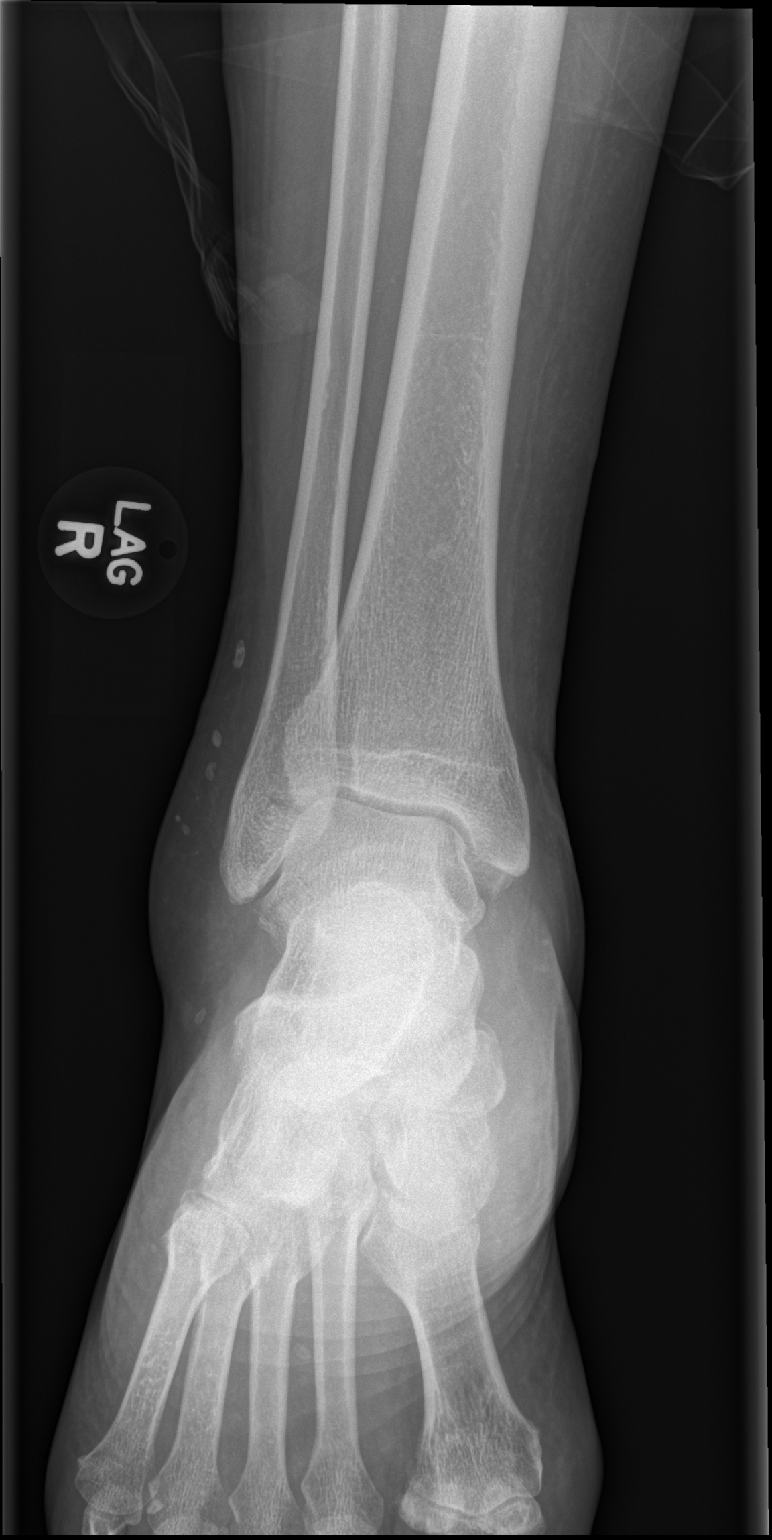

[x ankle obl right]
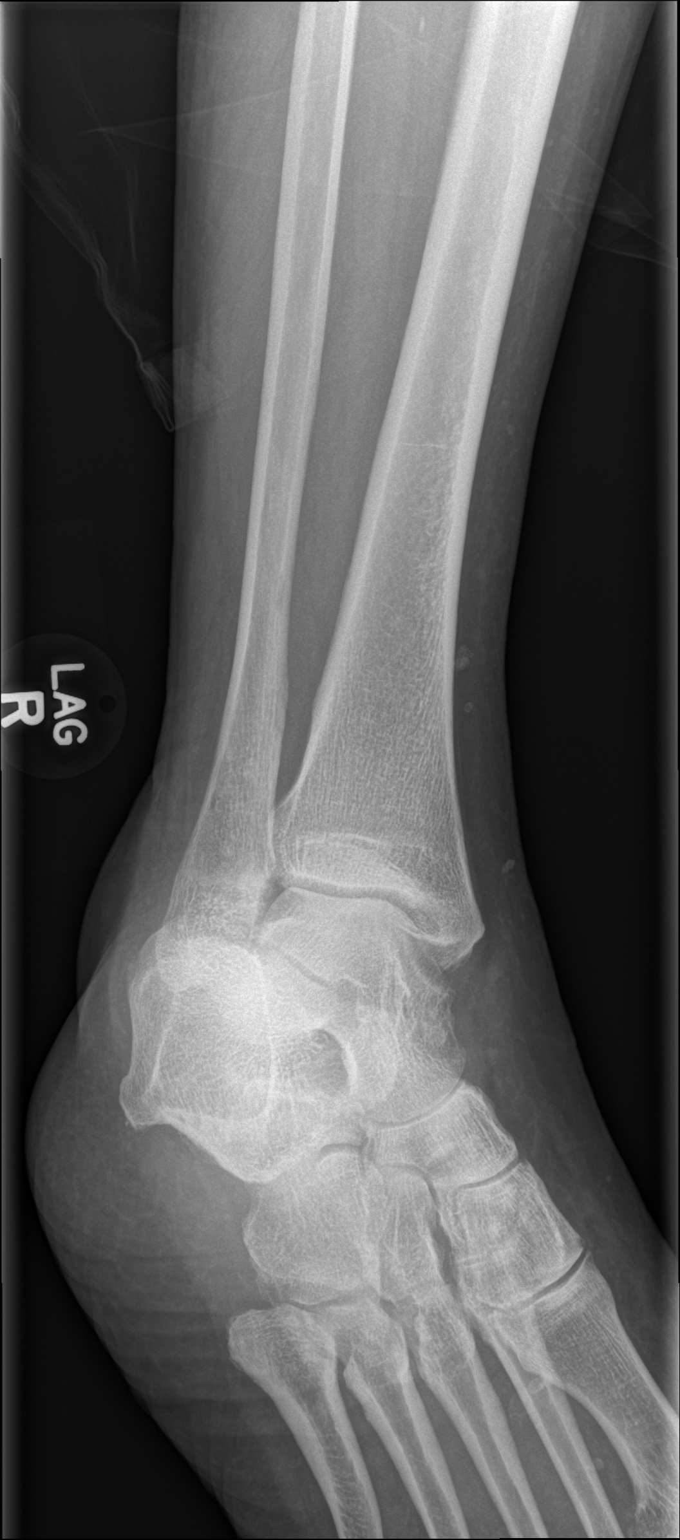

[3 of 3 positions shown; findings below may reference images not displayed]

FINDINGS: Soft tissue swelling about the right ankle. Soft tissue
calcifications likely phleboliths. No evidence of acute fracture or
dislocation in the right ankle. No focal bone lesion or bone
destruction. Bone cortex appears intact. Plantar calcaneal spur.
IMPRESSION: Soft tissue swelling.  No acute bony abnormalities.

## 2016-01-04 IMAGING — CR DG FOOT COMPLETE 3+V*R*
3 series · 3 of 3 positions shown · non-contrast
Comparison: None.

CLINICAL DATA: Right foot and ankle pain after injury. Dropped a
pan on her foot 4 days prior. Patient also with history of gout.

EXAM:
RIGHT FOOT COMPLETE - 3+ VIEW

[x foot ap right]
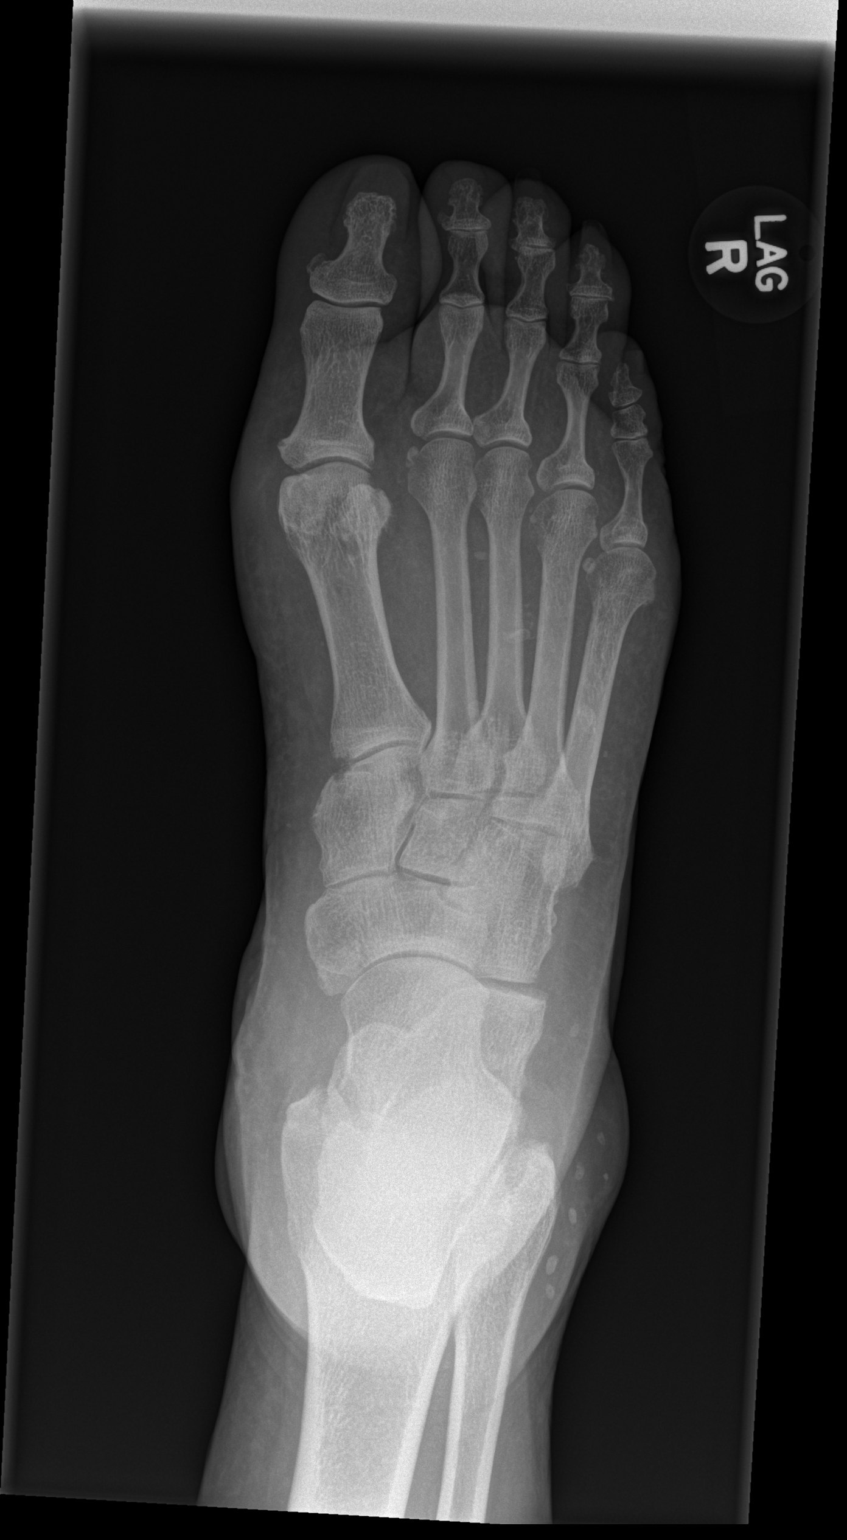

[x foot obl right]
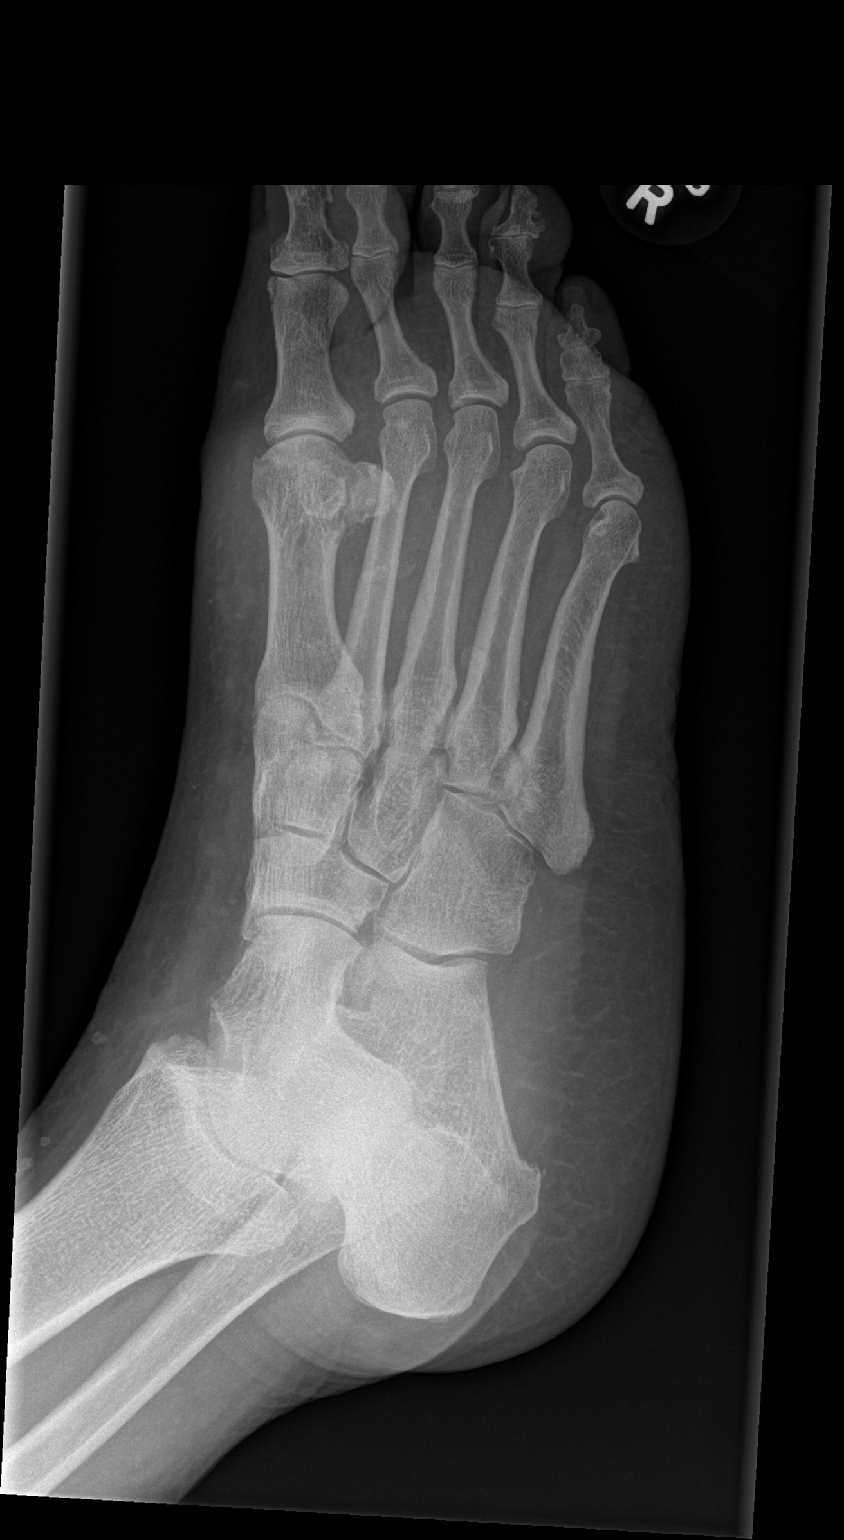

[x foot lat right]
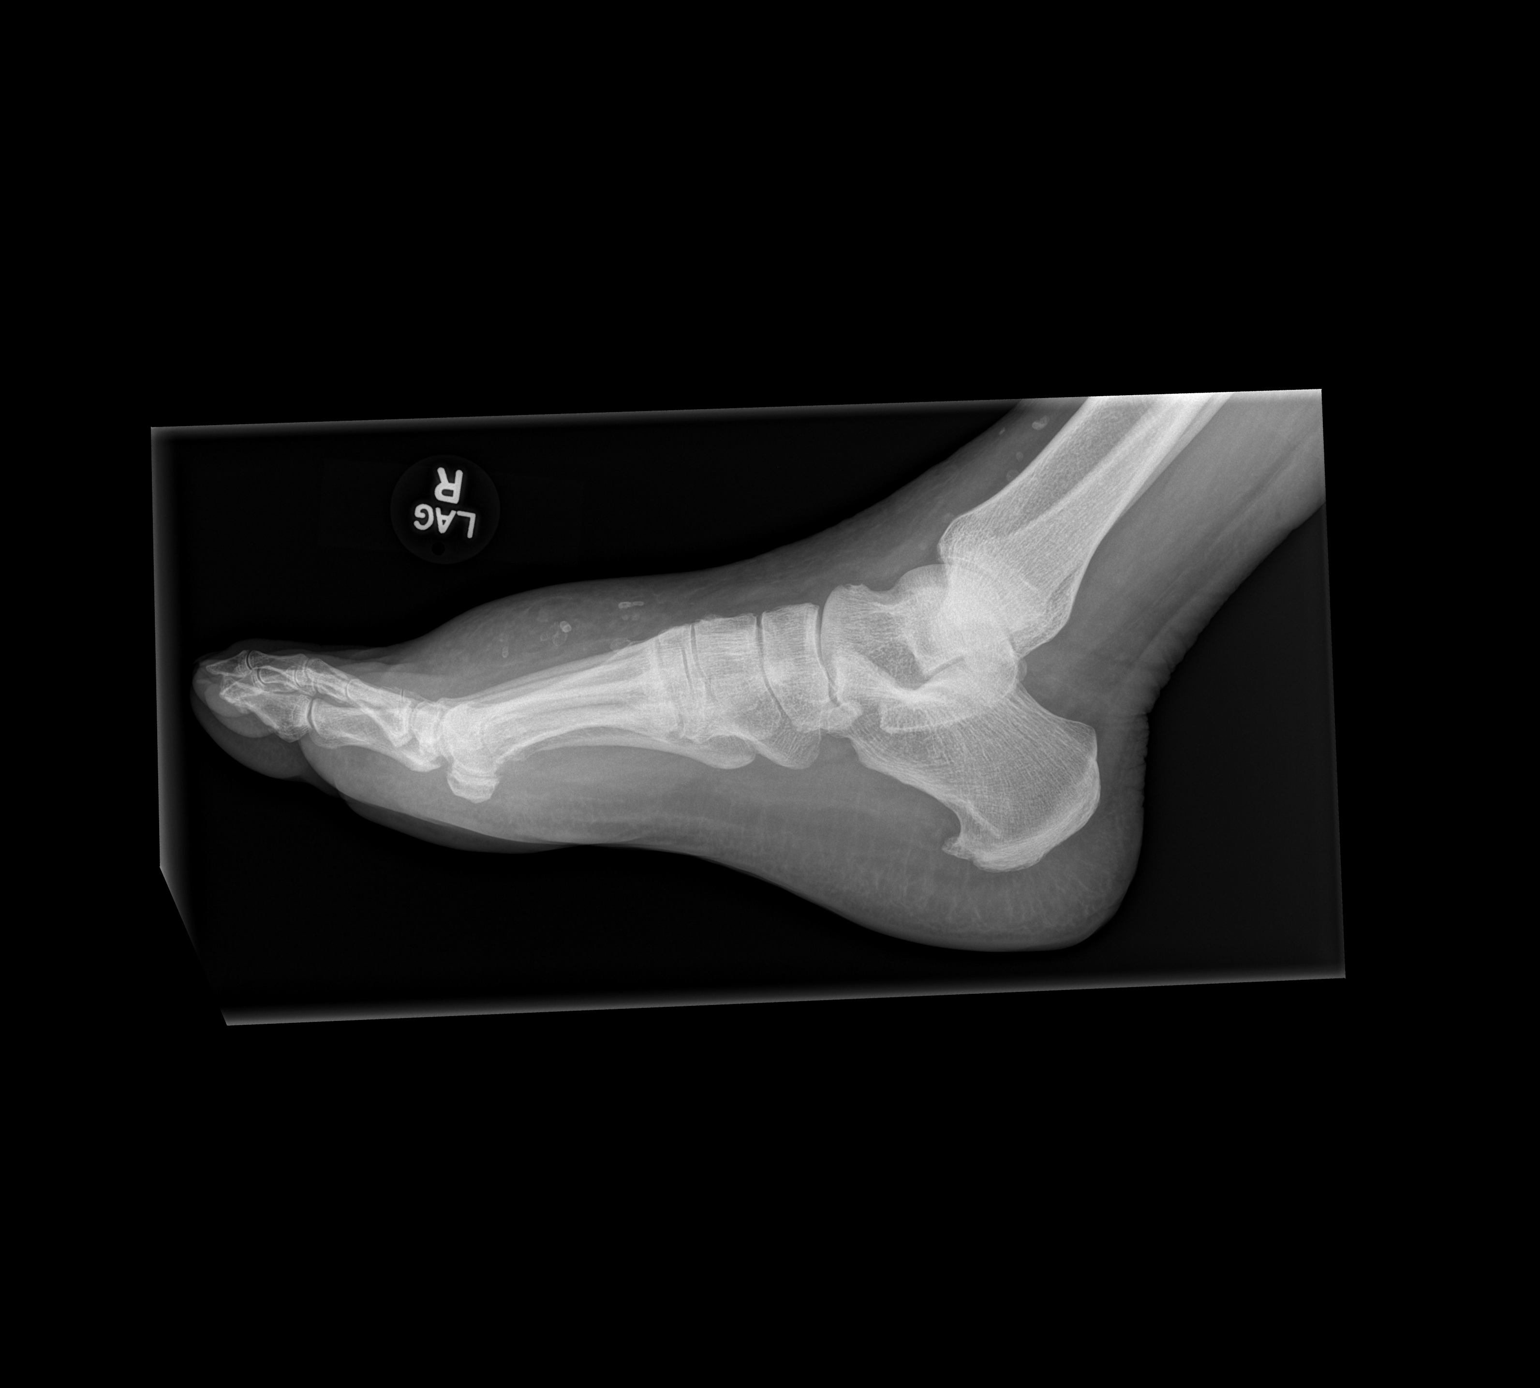

[3 of 3 positions shown; findings below may reference images not displayed]

FINDINGS: No acute fracture or dislocation. Dorsal soft tissue edema. Small
erosions at the first tarsal metatarsal joint, can be seen in the
setting of gout. Scattered osteoarthritis. Multiple soft tissue
calcifications are likely phleboliths. There is a plantar calcaneal
spur.
IMPRESSION: 1. Dorsal soft tissue edema.  No fracture or dislocation.
2. Erosions at the first tarsal metatarsal joint, may reflect
sequela of gout.

## 2016-01-04 MED ORDER — INDOMETHACIN 50 MG PO CAPS
50.0000 mg | ORAL_CAPSULE | Freq: Once | ORAL | Status: AC
  Administered 2016-01-04: 50 mg via ORAL
  Filled 2016-01-04: qty 1

## 2016-01-04 MED ORDER — INDOMETHACIN 50 MG PO CAPS: 50.0000 mg | ORAL_CAPSULE | Freq: Three times a day (TID) | ORAL | Status: DC

## 2016-01-04 NOTE — ED Provider Notes (Addendum)
CSN: [id number]     Arrival date & time 01/04/16  0041 History  By signing my name below, I, [name], attest that this documentation has been prepared under the direction and in the presence of Carmin Muskrat, MD. Electronically Signed: Eustaquio [name], ED Scribe. 01/04/2016. 2:17 AM.   No chief complaint on file.  The history is provided by the patient and the spouse. No language interpreter was used.     HPI Comments: [name] is a 75 y.o. female with PMHx HLD, HTN, Gout, and Arthritis who presents to the Emergency Department complaining of sudden onset, constant, worsening, right foot pain x 4 days. Pt states that she dropped a pot on the foot, causing the pain. Pt does have a hx of gout and believes that she is now having gout on her bilateral feet. She has been taking Colchicine for her pain without relief. Pt states that she was told by her PCP to stop taking her indomethacin after surgical complications resulting in a nick to her ureter. She denies having any issues with her kidneys. Denies weakness, numbness, tingling, fever, or any other associated symptoms.   Past Medical History  Diagnosis Date  . Hyperlipidemia   . Hypertension   . Obesity   . Diverticulitis hx of  . Diverticulosis   . Abdominal pain   . Constipation   . Colon adenoma   . GERD (gastroesophageal reflux disease)   . Pneumonia 2005  . Numbness and tingling in right hand     pt. states has numbness of right hand very frequently-watch positioning  . Personal history of colonic polyps-adenoma 08/26/2008  . H/O hiatal hernia   . Frequent UTI     hx of urethral injury during colon surgery - states frequent uti's since  . History of shingles     has a lingering itching on back where shingles were  . Pain     pain left knee and pain right hip and right groin  . Osteoporosis   . History of palpitations     in the past  . Arthritis     cervical disc degeneration/ oa left knee, carpal tunnel rt wrist,  adhesive capsulitis right shoulder, rt hand weakness; lumbar degeneration  . Complication of anesthesia 2006-at [facility]    breathing problems-no BP med given prior to surgery;  hx of being very sleepy after colon surgery --  states no problems with last right total knee replacement 2013  . Diabetes mellitus without complication ([place])     borderline - diet control   Past Surgical History  Procedure Laterality Date  . Abdominal hysterectomy    . Bladder tack    . Colonoscopy [id number]    . Oophorectomy    . Ureter repair for tyransected left ureter    . Temporary ureter stent    . 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 Joint replacement    . Colon resection  2008    hx diverticulosis  . Carpal tunnel release Right 06/04/2014    Procedure: RIGHT CARPAL TUNNEL RELEASE;  Surgeon: Roseanne Kaufman, MD;  Location: [place];  Service: Orthopedics;  Laterality: Right;  . Appendectomy    . Breast surgery      breast duct resection- benign  . Colon surgery    . Skin graft left arm - traumatic compression injury left  upper arm    . Total knee arthroplasty Left 08/17/2014    Procedure: LEFT TOTAL KNEE ARTHROPLASTY;  Surgeon: [name], MD;  Location: [facility];  Service: Orthopedics;  Laterality: Left;   Family History  Problem Relation Age of Onset  . Breast cancer Mother   . Prostate cancer Father   . Colon cancer Father 3   Social History  Substance Use Topics  . Smoking status: Never Smoker   . Smokeless tobacco: Never Used  . Alcohol Use: No   OB History    No data available     Review of Systems  Constitutional: Negative for fever.  HENT: Negative.   Cardiovascular: Negative.   Gastrointestinal: Negative.   Endocrine: Negative.   Genitourinary: Negative.   Musculoskeletal: Positive for arthralgias.  Skin: Positive for  color change.  Allergic/Immunologic: Negative for immunocompromised state.  Neurological: Negative for weakness and numbness.   Allergies  Bactrim; Lipitor; Other; Oxycodone; and Vicodin  Home Medications   Prior to Admission medications   Medication Sig Start Date End Date Taking? Authorizing Provider  acetaminophen (TYLENOL) 325 MG tablet Take 2 tablets (650 mg total) by mouth every 6 (six) hours as needed for mild pain (or Fever >/= 101). Patient not taking: Reported on 01/24/2015 08/18/14   Arlee Muslim, PA-C  amLODipine-benazepril (LOTREL) 5-20 MG per capsule Take 1 capsule by mouth daily. @@ 11 am.    Historical Provider, MD  aspirin EC 81 MG tablet Take 81 mg by mouth daily.    Historical Provider, MD  atenolol-chlorthalidone (TENORETIC) 50-25 MG per tablet Take 1 tablet by mouth daily. @@ 11 am 12/09/14   Kennyth Arnold, FNP  bisacodyl (DULCOLAX) 10 MG suppository Place 1 suppository (10 mg total) rectally daily as needed for moderate constipation. Patient not taking: Reported on 01/24/2015 08/18/14   Arlee Muslim, PA-C  docusate sodium 100 MG CAPS Take 100 mg by mouth 2 (two) times daily. Patient not taking: Reported on 01/24/2015 08/18/14   Arlee Muslim, PA-C  famotidine (PEPCID) 20 MG tablet Take 20 mg by mouth daily. @ 11 am    Historical Provider, MD  HYDROmorphone (DILAUDID) 2 MG tablet Take 1-2 tablets (2-4 mg total) by mouth every 4 (four) hours as needed for moderate pain or severe pain. Patient not taking: Reported on 01/24/2015 08/18/14   Arlee Muslim, PA-C  indomethacin (INDOCIN) 50 MG capsule TAKE ONE CAPSULE BY MOUTH THREE TIMES DAILY WITH MEALS 12/07/14   Kennyth Arnold, FNP  methocarbamol (ROBAXIN) 500 MG tablet Take 1 tablet (500 mg total) by mouth 3 (three) times daily. 01/24/15   Junius Creamer, NP  metoCLOPramide (REGLAN) 5 MG tablet Take 1 tablet (5 mg total) by mouth every 8 (eight) hours as needed for nausea (if ondansetron (ZOFRAN) ineffective.). Patient not taking:  Reported on 01/24/2015 08/18/14   Arlee Muslim, PA-C  ondansetron (ZOFRAN) 4 MG tablet Take 1 tablet (4 mg total) by mouth every 6 (six) hours as needed for nausea. Patient not taking: Reported on 01/24/2015 08/18/14   Arlee Muslim, PA-C  [facility] DELICA LANCETS [id number] MISC Use to check blood glucose once daily 06/19/14   Kennyth Arnold, FNP  polyethylene glycol (MIRALAX / GLYCOLAX) packet Take 17 g by mouth daily as needed for mild constipation. Patient not taking: Reported on 01/24/2015 08/18/14   Arlee Muslim, PA-C  rivaroxaban (XARELTO) 10 MG TABS tablet Take 1 tablet (10 mg total) by mouth daily with breakfast. Take Xarelto for two and a half  more weeks, then discontinue Xarelto. Once the patient has completed the Xarelto, they may resume the 81 mg Aspirin. Patient not taking: Reported on 01/24/2015 08/18/14   Arlee Muslim, PA-C  rosuvastatin (CRESTOR) 20 MG tablet Take 20 mg by mouth 3 (three) times a week.  02/24/11   Ricard Dillon, MD  traMADol (ULTRAM) 50 MG tablet Take 1 tablet (50 mg total) by mouth 4 (four) times daily. 01/24/15   Junius Creamer, NP   BP 127/81 mmHg  Pulse 60  Temp(Src) 98.2 F (36.8 C) (Oral)  Resp 18  SpO2 96%   Physical Exam  Constitutional: She is oriented to person, place, and time. She appears well-developed and well-nourished. No distress.  HENT:  Head: Normocephalic and atraumatic.  Eyes: Conjunctivae and EOM are normal.  Cardiovascular: Normal rate and regular rhythm.   Pulmonary/Chest: Effort normal and breath sounds normal. No stridor. No respiratory distress.  Abdominal: She exhibits no distension.  Musculoskeletal: She exhibits no edema.  Swollen right foot. NVI. Foot is warm to palpation.   Neurological: She is alert and oriented to person, place, and time. No cranial nerve deficit.  Skin: Skin is warm and dry.  Psychiatric: She has a normal mood and affect.  Nursing note and vitals reviewed.   ED Course  Procedures (including critical care  time)  DIAGNOSTIC STUDIES: Oxygen Saturation is 96% on RA, normal by my interpretation.    COORDINATION OF CARE: 2:17 AM-Discussed treatment plan with pt at bedside and pt agreed to plan.   Labs Review Labs Reviewed - No data to display  Imaging Review Dg Ankle Complete Right  01/04/2016  CLINICAL DATA:  Pain and swelling of the right foot after injury on 12/31/2015. EXAM: RIGHT ANKLE - COMPLETE 3+ VIEW COMPARISON:  None. FINDINGS: Soft tissue swelling about the right ankle. Soft tissue calcifications likely phleboliths. No evidence of acute fracture or dislocation in the right ankle. No focal bone lesion or bone destruction. Bone cortex appears intact. Plantar calcaneal spur. IMPRESSION: Soft tissue swelling.  No acute bony abnormalities. Electronically Signed   By: Lucienne Capers M.D.   On: 01/04/2016 01:52   Dg Foot Complete Right  01/04/2016  CLINICAL DATA:  Right foot and ankle pain after injury. Dropped a pan on her foot 4 days prior. Patient also with history of gout. EXAM: RIGHT FOOT COMPLETE - 3+ VIEW COMPARISON:  None. FINDINGS: No acute fracture or dislocation. Dorsal soft tissue edema. Small erosions at the first tarsal metatarsal joint, can be seen in the setting of gout. Scattered osteoarthritis. Multiple soft tissue calcifications are likely phleboliths. There is a plantar calcaneal spur. IMPRESSION: 1. Dorsal soft tissue edema.  No fracture or dislocation. 2. Erosions at the first tarsal metatarsal joint, may reflect sequela of gout. Electronically Signed   By: Jeb Levering M.D.   On: 01/04/2016 01:53   I have personally reviewed and evaluated these images and lab results as part of my medical decision-making.   Patient states that she has previously been on indomethacin, and denies any history of renal dysfunction. MDM    I personally performed the services described in this documentation, which was scribed in my presence. The recorded information has been reviewed and  is accurate.    Patient presents with concern of ongoing foot pain. Patient is distally neurovascularly intact, though she does have a swollen, slightly warm foot. Patient's presentation was consistent with gout, with reassuring x-ray, vital signs are Patient discharged in stable condition, analgesia provided.  Carmin Muskrat, MD 01/04/16 [id number]  Carmin Muskrat, MD 01/04/16 [phone]

## 2016-01-04 NOTE — Discharge Instructions (Signed)
As discussed, today's evaluation has been largely reassuring. There is no evidence for fracture. Your pain is likely due to gout. Please be sure to continue using the indomethacin for pain relief. Please be sure to follow up with your primary care physician.

## 2016-01-04 NOTE — ED Notes (Addendum)
Patient presents for right foot pain. Reports she dropped a pot on the top of her right foot Friday. Patient also believes she has a gout flare up in bilateral feet. No obvious deformity or swelling noted to same.

## 2016-01-11 ENCOUNTER — Encounter: Payer: Medicare Other | Attending: Internal Medicine

## 2016-01-11 VITALS — Ht 68.0 in | Wt 260.6 lb

## 2016-01-11 DIAGNOSIS — E119 Type 2 diabetes mellitus without complications: Secondary | ICD-10-CM | POA: Diagnosis not present

## 2016-01-17 NOTE — Progress Notes (Signed)
Patient was seen on 01/11/16 for the first of a series of three diabetes self-management courses at the [facility].  Patient Education Plan per assessed needs and concerns is to attend four course education program for Diabetes Self Management Education.  The following learning objectives were met by the patient during this class:  Describe diabetes  State some common risk factors for diabetes  Defines the role of glucose and insulin  Identifies type of diabetes and pathophysiology  Describe the relationship between diabetes and cardiovascular risk  State the members of the Healthcare Team  States the rationale for glucose monitoring  State when to test glucose  State their individual Target Range  State the importance of logging glucose readings  Describe how to interpret glucose readings  Identifies A1C target  Explain the correlation between A1c and eAG values  State symptoms and treatment of high blood glucose  State symptoms and treatment of low blood glucose  Explain proper technique for glucose testing  Identifies proper sharps disposal  Handouts given during class include:  Living Well with Diabetes book  Carb Counting and Meal Planning book  Meal Plan Card  Carbohydrate guide  Meal planning worksheet  Low Sodium Flavoring Tips  The diabetes portion plate  Y8F to eAG Conversion Chart  Diabetes Medications  Diabetes Recommended Care Schedule  Support Group  Diabetes Success Plan  Core Class Satisfaction Survey  Follow-Up Plan:  Attend core 2

## 2016-01-18 DIAGNOSIS — E119 Type 2 diabetes mellitus without complications: Secondary | ICD-10-CM

## 2016-01-18 NOTE — Progress Notes (Signed)

## 2016-01-21 DIAGNOSIS — Z09 Encounter for follow-up examination after completed treatment for conditions other than malignant neoplasm: Secondary | ICD-10-CM | POA: Diagnosis not present

## 2016-01-21 DIAGNOSIS — M79674 Pain in right toe(s): Secondary | ICD-10-CM | POA: Diagnosis not present

## 2016-01-21 DIAGNOSIS — M109 Gout, unspecified: Secondary | ICD-10-CM | POA: Diagnosis not present

## 2016-01-25 ENCOUNTER — Ambulatory Visit: Payer: Medicare Other

## 2016-01-27 ENCOUNTER — Encounter: Payer: Self-pay | Admitting: Internal Medicine

## 2016-01-27 ENCOUNTER — Ambulatory Visit: Payer: Medicare Other

## 2016-03-07 ENCOUNTER — Ambulatory Visit: Payer: Medicare Other

## 2016-04-06 DIAGNOSIS — M791 Myalgia: Secondary | ICD-10-CM | POA: Diagnosis not present

## 2016-04-06 DIAGNOSIS — N289 Disorder of kidney and ureter, unspecified: Secondary | ICD-10-CM | POA: Diagnosis not present

## 2016-04-06 DIAGNOSIS — E119 Type 2 diabetes mellitus without complications: Secondary | ICD-10-CM | POA: Diagnosis not present

## 2016-04-06 DIAGNOSIS — I10 Essential (primary) hypertension: Secondary | ICD-10-CM | POA: Diagnosis not present

## 2016-04-06 DIAGNOSIS — M79602 Pain in left arm: Secondary | ICD-10-CM | POA: Diagnosis not present

## 2016-04-18 ENCOUNTER — Ambulatory Visit: Payer: Medicare Other | Admitting: Skilled Nursing Facility1

## 2016-06-06 ENCOUNTER — Other Ambulatory Visit: Payer: Self-pay | Admitting: Internal Medicine

## 2016-06-06 DIAGNOSIS — Z1231 Encounter for screening mammogram for malignant neoplasm of breast: Secondary | ICD-10-CM

## 2016-07-05 ENCOUNTER — Telehealth: Payer: Self-pay | Admitting: Family Medicine

## 2016-07-05 NOTE — Telephone Encounter (Signed)
This was a pt of dr Arnoldo Morale and pt would like to re-est with dr hunter. Can I create 30 min slot this year?

## 2016-07-05 NOTE — Telephone Encounter (Signed)
There is a 1:45 on November 10th. Does that work for her. You can use any slot 8:15, 8:45, 1:15 or 1:45 any day of the week but would prefer not to create new 30 minute appointment.

## 2016-07-10 NOTE — Telephone Encounter (Signed)
lmom for pt to call back

## 2016-07-11 DIAGNOSIS — N302 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: Secondary | ICD-10-CM | POA: Diagnosis not present

## 2016-07-11 DIAGNOSIS — R8290 Unspecified abnormal findings in urine: Secondary | ICD-10-CM | POA: Diagnosis not present

## 2016-07-11 DIAGNOSIS — R300 Dysuria: Secondary | ICD-10-CM | POA: Diagnosis not present

## 2016-07-12 DIAGNOSIS — E119 Type 2 diabetes mellitus without complications: Secondary | ICD-10-CM | POA: Diagnosis not present

## 2016-07-12 DIAGNOSIS — Z79899 Other long term (current) drug therapy: Secondary | ICD-10-CM | POA: Diagnosis not present

## 2016-07-12 DIAGNOSIS — I10 Essential (primary) hypertension: Secondary | ICD-10-CM | POA: Diagnosis not present

## 2016-07-12 DIAGNOSIS — M109 Gout, unspecified: Secondary | ICD-10-CM | POA: Diagnosis not present

## 2016-07-13 NOTE — Telephone Encounter (Signed)
lmom for pt to call back

## 2016-07-18 ENCOUNTER — Inpatient Hospital Stay: Admission: RE | Admit: 2016-07-18 | Payer: Medicare Other | Source: Ambulatory Visit

## 2016-07-18 DIAGNOSIS — N390 Urinary tract infection, site not specified: Secondary | ICD-10-CM | POA: Diagnosis not present

## 2016-07-18 DIAGNOSIS — N302 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: Secondary | ICD-10-CM | POA: Diagnosis not present

## 2016-07-18 NOTE — Telephone Encounter (Signed)
lmom for pt to sch an appt °

## 2016-07-28 ENCOUNTER — Ambulatory Visit: Payer: Medicare Other

## 2016-07-31 ENCOUNTER — Ambulatory Visit
Admission: RE | Admit: 2016-07-31 | Discharge: 2016-07-31 | Disposition: A | Discharge status: Home / Self Care | Payer: Medicare Other | Source: Ambulatory Visit | Attending: Internal Medicine | Admitting: Internal Medicine

## 2016-07-31 DIAGNOSIS — Z1231 Encounter for screening mammogram for malignant neoplasm of breast: Secondary | ICD-10-CM | POA: Diagnosis not present

## 2016-12-11 DIAGNOSIS — M7542 Impingement syndrome of left shoulder: Secondary | ICD-10-CM | POA: Diagnosis not present

## 2016-12-11 DIAGNOSIS — M25512 Pain in left shoulder: Secondary | ICD-10-CM | POA: Diagnosis not present

## 2016-12-11 DIAGNOSIS — M25511 Pain in right shoulder: Secondary | ICD-10-CM | POA: Diagnosis not present

## 2016-12-11 DIAGNOSIS — G8929 Other chronic pain: Secondary | ICD-10-CM | POA: Diagnosis not present

## 2016-12-11 DIAGNOSIS — M7541 Impingement syndrome of right shoulder: Secondary | ICD-10-CM | POA: Diagnosis not present

## 2017-01-03 DIAGNOSIS — N183 Chronic kidney disease, stage 3 (moderate): Secondary | ICD-10-CM | POA: Diagnosis not present

## 2017-01-03 DIAGNOSIS — Z0000 Encounter for general adult medical examination without abnormal findings: Secondary | ICD-10-CM | POA: Diagnosis not present

## 2017-01-03 DIAGNOSIS — E559 Vitamin D deficiency, unspecified: Secondary | ICD-10-CM | POA: Diagnosis not present

## 2017-01-03 DIAGNOSIS — E1122 Type 2 diabetes mellitus with diabetic chronic kidney disease: Secondary | ICD-10-CM | POA: Diagnosis not present

## 2017-01-03 DIAGNOSIS — I129 Hypertensive chronic kidney disease with stage 1 through stage 4 chronic kidney disease, or unspecified chronic kidney disease: Secondary | ICD-10-CM | POA: Diagnosis not present

## 2017-02-21 DIAGNOSIS — H25813 Combined forms of age-related cataract, bilateral: Secondary | ICD-10-CM | POA: Diagnosis not present

## 2017-02-22 DIAGNOSIS — N281 Cyst of kidney, acquired: Secondary | ICD-10-CM | POA: Diagnosis not present

## 2017-02-22 DIAGNOSIS — R8290 Unspecified abnormal findings in urine: Secondary | ICD-10-CM | POA: Diagnosis not present

## 2017-02-22 DIAGNOSIS — R109 Unspecified abdominal pain: Secondary | ICD-10-CM | POA: Diagnosis not present

## 2017-04-09 DIAGNOSIS — H2511 Age-related nuclear cataract, right eye: Secondary | ICD-10-CM | POA: Diagnosis not present

## 2017-04-09 DIAGNOSIS — H25811 Combined forms of age-related cataract, right eye: Secondary | ICD-10-CM | POA: Diagnosis not present

## 2017-06-04 DIAGNOSIS — E1122 Type 2 diabetes mellitus with diabetic chronic kidney disease: Secondary | ICD-10-CM | POA: Diagnosis not present

## 2017-06-04 DIAGNOSIS — N183 Chronic kidney disease, stage 3 (moderate): Secondary | ICD-10-CM | POA: Diagnosis not present

## 2017-06-04 DIAGNOSIS — I129 Hypertensive chronic kidney disease with stage 1 through stage 4 chronic kidney disease, or unspecified chronic kidney disease: Secondary | ICD-10-CM | POA: Diagnosis not present

## 2017-06-04 DIAGNOSIS — N08 Glomerular disorders in diseases classified elsewhere: Secondary | ICD-10-CM | POA: Diagnosis not present

## 2017-07-05 DIAGNOSIS — R109 Unspecified abdominal pain: Secondary | ICD-10-CM | POA: Diagnosis not present

## 2017-07-05 DIAGNOSIS — M545 Low back pain: Secondary | ICD-10-CM | POA: Diagnosis not present

## 2017-07-05 DIAGNOSIS — Z79899 Other long term (current) drug therapy: Secondary | ICD-10-CM | POA: Diagnosis not present

## 2017-07-11 ENCOUNTER — Other Ambulatory Visit: Payer: Self-pay | Admitting: Internal Medicine

## 2017-07-11 DIAGNOSIS — R109 Unspecified abdominal pain: Secondary | ICD-10-CM

## 2017-07-11 DIAGNOSIS — K469 Unspecified abdominal hernia without obstruction or gangrene: Secondary | ICD-10-CM

## 2017-07-12 ENCOUNTER — Encounter (HOSPITAL_COMMUNITY): Payer: Self-pay

## 2017-07-12 DIAGNOSIS — Z96652 Presence of left artificial knee joint: Secondary | ICD-10-CM | POA: Insufficient documentation

## 2017-07-12 DIAGNOSIS — I10 Essential (primary) hypertension: Secondary | ICD-10-CM | POA: Insufficient documentation

## 2017-07-12 DIAGNOSIS — R109 Unspecified abdominal pain: Secondary | ICD-10-CM | POA: Diagnosis not present

## 2017-07-12 DIAGNOSIS — M4805 Spinal stenosis, thoracolumbar region: Secondary | ICD-10-CM | POA: Diagnosis not present

## 2017-07-12 DIAGNOSIS — Z96651 Presence of right artificial knee joint: Secondary | ICD-10-CM | POA: Diagnosis not present

## 2017-07-12 DIAGNOSIS — K8020 Calculus of gallbladder without cholecystitis without obstruction: Secondary | ICD-10-CM | POA: Diagnosis not present

## 2017-07-12 DIAGNOSIS — R05 Cough: Secondary | ICD-10-CM | POA: Diagnosis not present

## 2017-07-12 DIAGNOSIS — M48061 Spinal stenosis, lumbar region without neurogenic claudication: Secondary | ICD-10-CM | POA: Diagnosis not present

## 2017-07-12 DIAGNOSIS — E119 Type 2 diabetes mellitus without complications: Secondary | ICD-10-CM | POA: Insufficient documentation

## 2017-07-12 DIAGNOSIS — M545 Low back pain: Secondary | ICD-10-CM | POA: Diagnosis present

## 2017-07-12 DIAGNOSIS — Z79899 Other long term (current) drug therapy: Secondary | ICD-10-CM | POA: Insufficient documentation

## 2017-07-12 DIAGNOSIS — R079 Chest pain, unspecified: Secondary | ICD-10-CM | POA: Diagnosis not present

## 2017-07-13 ENCOUNTER — Emergency Department (HOSPITAL_COMMUNITY)
Admission: EM | Admit: 2017-07-13 | Discharge: 2017-07-13 | Disposition: A | Discharge status: Home / Self Care | Payer: Medicare Other | Attending: Emergency Medicine | Admitting: Emergency Medicine

## 2017-07-13 ENCOUNTER — Emergency Department (HOSPITAL_COMMUNITY): Payer: Medicare Other

## 2017-07-13 ENCOUNTER — Encounter (HOSPITAL_COMMUNITY): Payer: Self-pay | Admitting: Emergency Medicine

## 2017-07-13 DIAGNOSIS — R05 Cough: Secondary | ICD-10-CM | POA: Diagnosis not present

## 2017-07-13 DIAGNOSIS — M4805 Spinal stenosis, thoracolumbar region: Secondary | ICD-10-CM

## 2017-07-13 DIAGNOSIS — R079 Chest pain, unspecified: Secondary | ICD-10-CM | POA: Diagnosis not present

## 2017-07-13 DIAGNOSIS — K8020 Calculus of gallbladder without cholecystitis without obstruction: Secondary | ICD-10-CM | POA: Diagnosis not present

## 2017-07-13 LAB — CBC WITH DIFFERENTIAL/PLATELET
Basophils Absolute: 0 10*3/uL (ref 0.0–0.1)
Basophils Relative: 0 %
Eosinophils Absolute: 0.4 10*3/uL (ref 0.0–0.7)
Eosinophils Relative: 4 %
HCT: 40.9 % (ref 36.0–46.0)
Hemoglobin: 13.7 g/dL (ref 12.0–15.0)
Lymphocytes Relative: 14 %
Lymphs Abs: 1.6 10*3/uL (ref 0.7–4.0)
MCH: 30 pg (ref 26.0–34.0)
MCHC: 33.5 g/dL (ref 30.0–36.0)
MCV: 89.7 fL (ref 78.0–100.0)
Monocytes Absolute: 0.9 10*3/uL (ref 0.1–1.0)
Monocytes Relative: 8 %
Neutro Abs: 8 10*3/uL — ABNORMAL HIGH (ref 1.7–7.7)
Neutrophils Relative %: 74 %
Platelets: 206 10*3/uL (ref 150–400)
RBC: 4.56 MIL/uL (ref 3.87–5.11)
RDW: 13.8 % (ref 11.5–15.5)
WBC: 10.9 10*3/uL — ABNORMAL HIGH (ref 4.0–10.5)

## 2017-07-13 LAB — I-STAT CHEM 8, ED
BUN: 23 mg/dL — ABNORMAL HIGH (ref 6–20)
Calcium, Ion: 1.17 mmol/L (ref 1.15–1.40)
Chloride: 99 mmol/L — ABNORMAL LOW (ref 101–111)
Creatinine, Ser: 1.1 mg/dL — ABNORMAL HIGH (ref 0.44–1.00)
Glucose, Bld: 178 mg/dL — ABNORMAL HIGH (ref 65–99)
HCT: 42 % (ref 36.0–46.0)
Hemoglobin: 14.3 g/dL (ref 12.0–15.0)
Potassium: 3.4 mmol/L — ABNORMAL LOW (ref 3.5–5.1)
Sodium: 138 mmol/L (ref 135–145)
TCO2: 25 mmol/L (ref 22–32)

## 2017-07-13 LAB — URINALYSIS, ROUTINE W REFLEX MICROSCOPIC
Bilirubin Urine: NEGATIVE
Glucose, UA: NEGATIVE mg/dL
Hgb urine dipstick: NEGATIVE
Ketones, ur: NEGATIVE mg/dL
Leukocytes, UA: NEGATIVE
Nitrite: NEGATIVE
Protein, ur: NEGATIVE mg/dL
Specific Gravity, Urine: 1.011 (ref 1.005–1.030)
pH: 5 (ref 5.0–8.0)

## 2017-07-13 LAB — LIPASE, BLOOD: Lipase: 20 U/L (ref 11–51)

## 2017-07-13 LAB — HEPATIC FUNCTION PANEL
ALT: 32 U/L (ref 14–54)
AST: 40 U/L (ref 15–41)
Albumin: 3.3 g/dL — ABNORMAL LOW (ref 3.5–5.0)
Alkaline Phosphatase: 47 U/L (ref 38–126)
Bilirubin, Direct: 0.2 mg/dL (ref 0.1–0.5)
Indirect Bilirubin: 1 mg/dL — ABNORMAL HIGH (ref 0.3–0.9)
Total Bilirubin: 1.2 mg/dL (ref 0.3–1.2)
Total Protein: 6.6 g/dL (ref 6.5–8.1)

## 2017-07-13 LAB — I-STAT TROPONIN, ED: Troponin i, poc: 0 ng/mL (ref 0.00–0.08)

## 2017-07-13 IMAGING — CT CT RENAL STONE PROTOCOL
2 of 3 series · 15 of 46 positions shown, 17 images · non-contrast
Comparison: [DATE] CT abdomen and pelvis.

CLINICAL DATA: 76 y/o  F; bilateral back pain.

EXAM:
CT ABDOMEN AND PELVIS WITHOUT CONTRAST
TECHNIQUE: Multidetector CT imaging of the abdomen and pelvis was performed
following the standard protocol without IV contrast.

[Series 3: lung · axial · 0.82mm/px · z∈[+1460,+1564]mm · 12 of 60 slices shown, 14 images]
[im 4/60  soft-tissue]
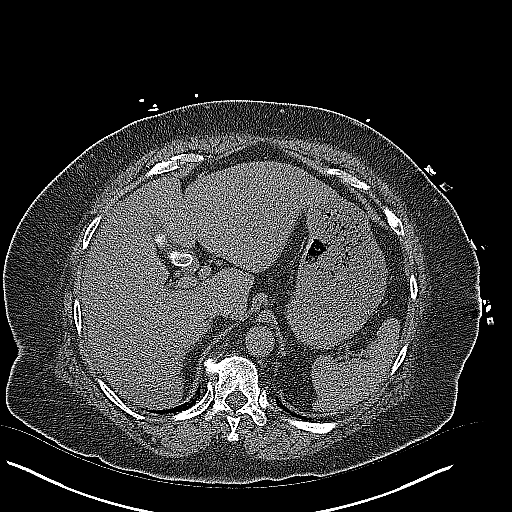
[im 4/60  bone]
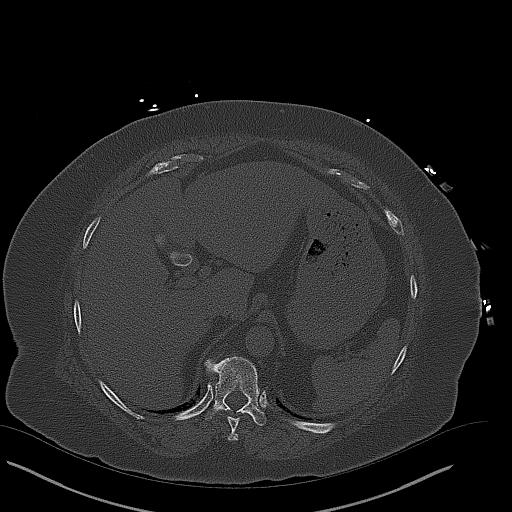
[im 8/60  soft-tissue]
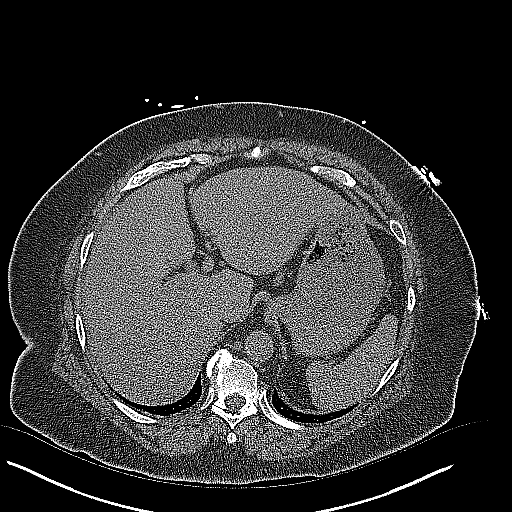
[im 14/60  soft-tissue]
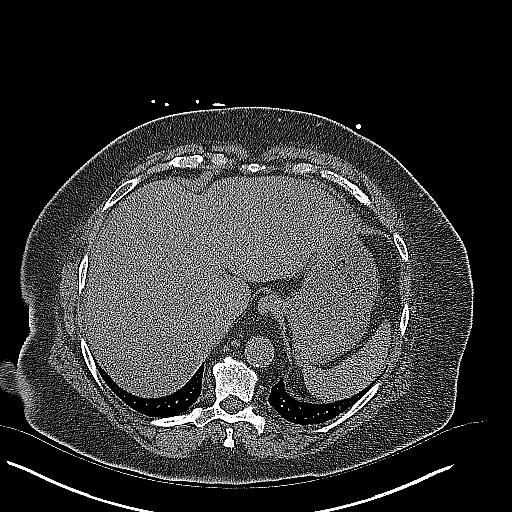
[im 18/60  soft-tissue]
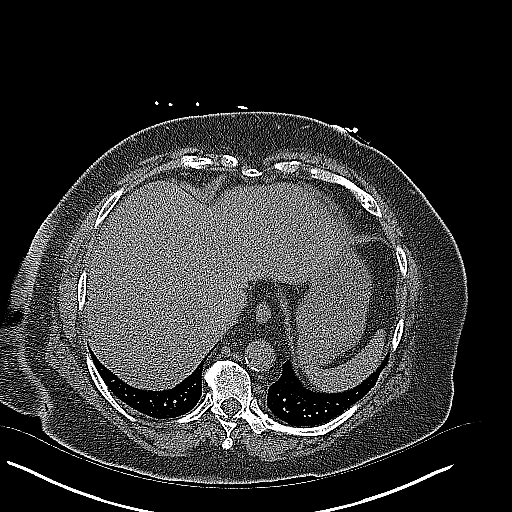
[im 23/60  soft-tissue]
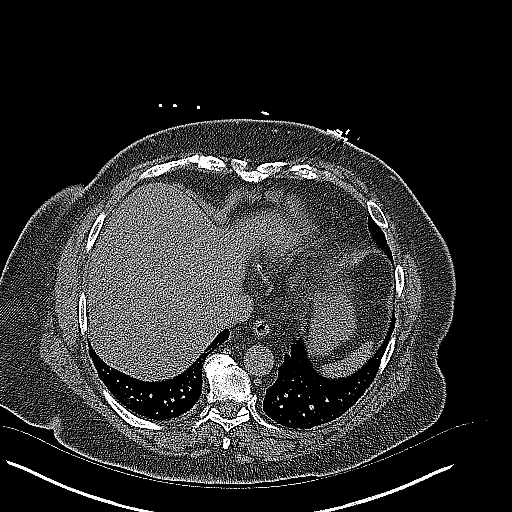
[im 27/60  soft-tissue]
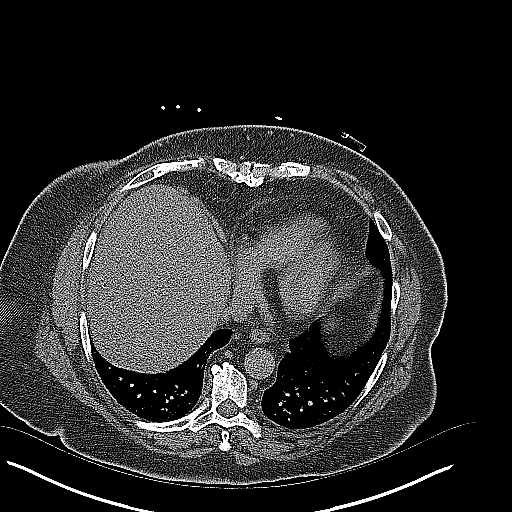
[im 33/60  soft-tissue]
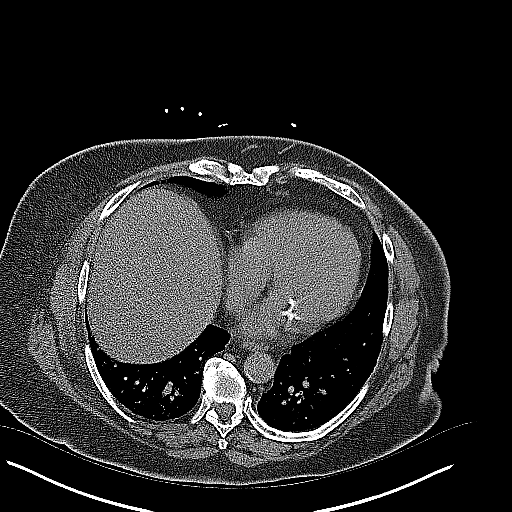
[im 37/60  soft-tissue]
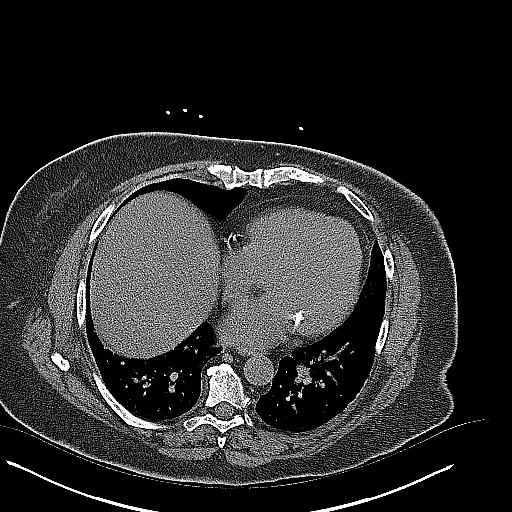
[im 42/60  soft-tissue]
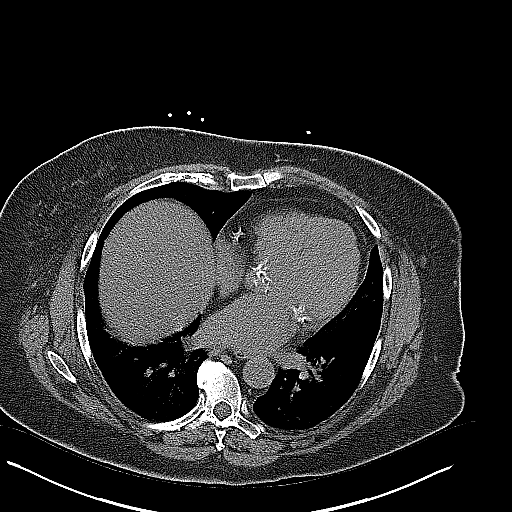
[im 42/60  bone]
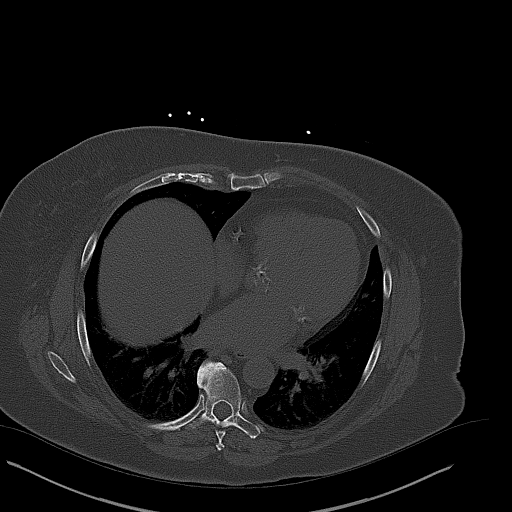
[im 46/60  soft-tissue]
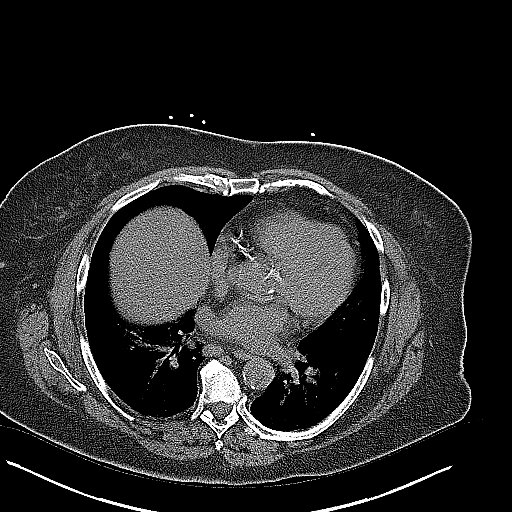
[im 52/60  soft-tissue]
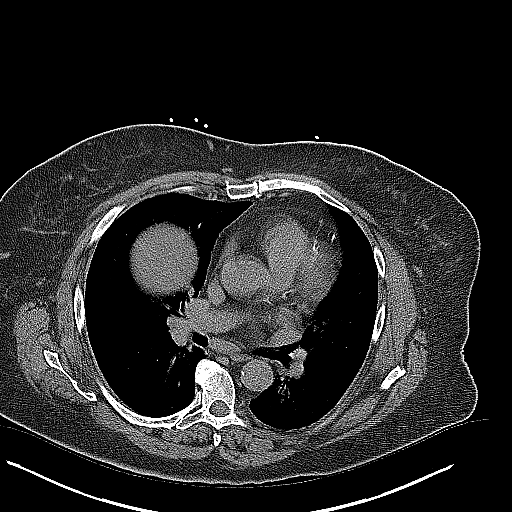
[im 56/60  soft-tissue]
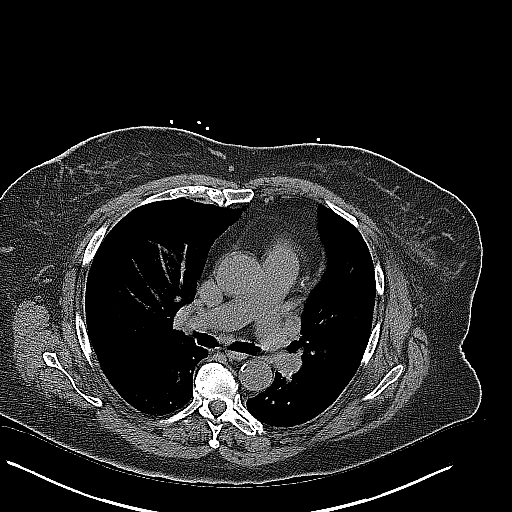

[Series 4: coronal · coronal · 0.70mm/px · 3 of 159 slices shown]
[im 53/159  soft-tissue]
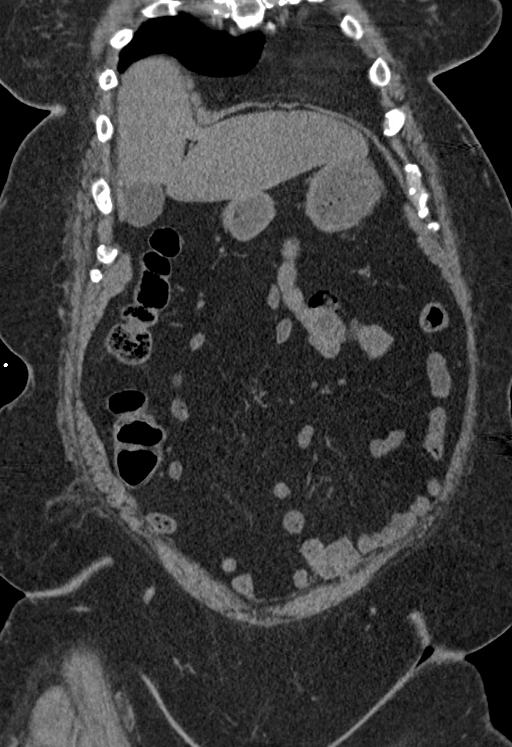
[im 71/159  soft-tissue]
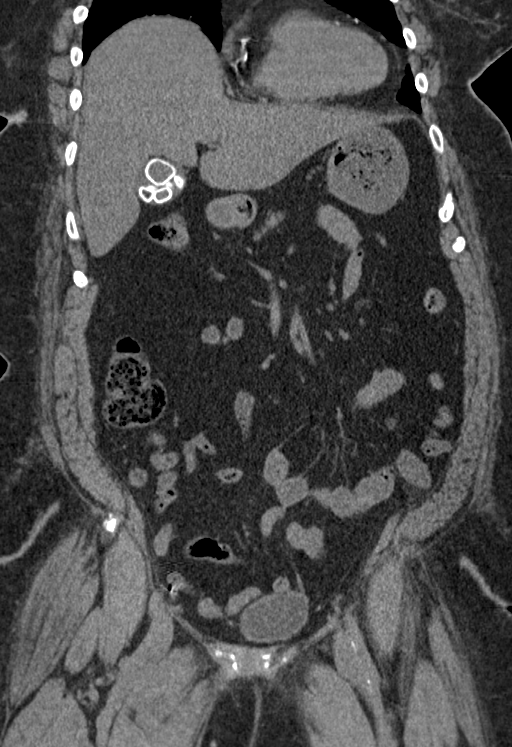
[im 88/159  soft-tissue]
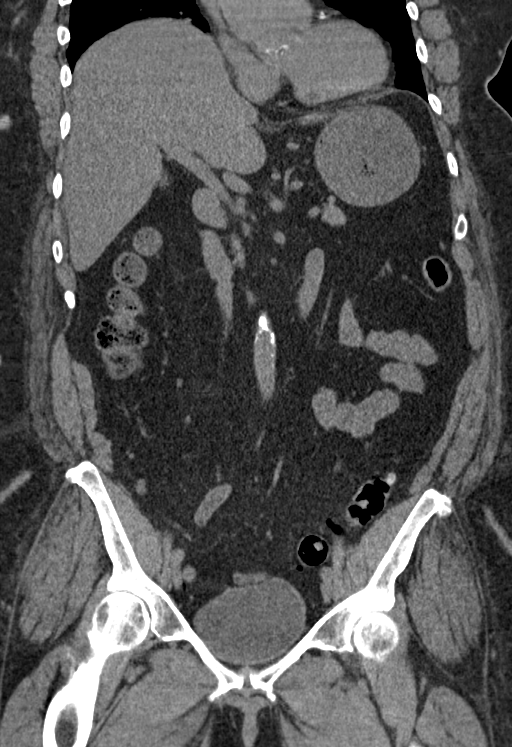

[15 of 46 positions shown; findings below may reference images not displayed]

FINDINGS: Lower chest: Moderate coronary artery calcification. Mitral annular
calcification.

Hepatobiliary: Stable punctate calcifications in right lobe of liver
the representing sequelae of prior granulomatous disease. No other
focal liver lesion. Cholelithiasis. No intra or extrahepatic biliary
ductal dilatation peer

Pancreas: Unremarkable. No pancreatic ductal dilatation or
surrounding inflammatory changes.

Spleen: Normal in size without focal abnormality.

Adrenals/Urinary Tract: Adrenal glands are unremarkable. Kidneys are
normal, without renal calculi, focal lesion, or hydronephrosis.
Bladder is unremarkable.

Stomach/Bowel: Patent colorectal anastomosis. Sigmoid colon moderate
diverticulosis. No findings of acute diverticulitis. No obstructive
or inflammatory changes of bowel. Normal residual appendix.

Vascular/Lymphatic: Aortic atherosclerosis. No enlarged abdominal or
pelvic lymph nodes.

Reproductive: Status post hysterectomy. No adnexal masses.

Other: Several adjacent paraumbilical ventral abdominal hernias
containing fat measuring up to 5.7 cm.

Musculoskeletal: Progression of advanced lumbar spine degenerative
changes with grade 1 L3-4 and L4-5 anterolisthesis and multilevel
disc space narrowing greatest at L3-4 where it is moderate to
severe. Multifactorial canal stenosis moderate to severe at the L3-4
and L4-5 levels. No acute osseous abnormality is evident.
IMPRESSION: 1. No acute process identified.
2. Moderate coronary artery and aortic calcific atherosclerosis.
3. Cholelithiasis.
4. Moderate sigmoid colon diverticulosis.
5. Several adjacent paraumbilical ventral abdominal hernias
containing fat measuring up to 5.7 cm, minimally increased in size.
6. Progression of lumbar degenerative changes with multifactorial
moderate to severe stenosis at the L3-4 and L4-5 levels.

By: LUSK M.D.

## 2017-07-13 MED ORDER — LIDOCAINE 5 % EX PTCH: 1.0000 | MEDICATED_PATCH | CUTANEOUS | 0 refills | Status: DC

## 2017-07-13 MED ORDER — METHOCARBAMOL 500 MG PO TABS: 500.0000 mg | ORAL_TABLET | Freq: Two times a day (BID) | ORAL | 0 refills | Status: DC

## 2017-07-13 MED ORDER — KETOROLAC TROMETHAMINE 30 MG/ML IJ SOLN
15.0000 mg | Freq: Once | INTRAMUSCULAR | Status: AC
  Administered 2017-07-13: 15 mg via INTRAVENOUS
  Filled 2017-07-13: qty 1

## 2017-07-13 NOTE — ED Provider Notes (Signed)
[facility] DEPT Provider Note   CSN: [id number] Arrival date & time: 07/12/17  2224     History   Chief Complaint Chief Complaint  Patient presents with  . Abdominal Pain    HPI [name] is a 76 y.o. female.   Back Pain   This is a new problem. The current episode started more than 1 week ago (2 weeks). The problem occurs constantly. The problem has not changed since onset.The pain is associated with no known injury. The pain is present in the thoracic spine, lumbar spine and sacro-iliac joint. The quality of the pain is described as shooting. Radiates to: B abdomen. The pain is severe. Exacerbated by: nothing. The pain is the same all the time. Associated symptoms include abdominal pain. Pertinent negatives include no chest pain, no fever, no numbness, no weight loss, no headaches, no abdominal swelling, no bowel incontinence, no perianal numbness, no bladder incontinence, no dysuria, no pelvic pain, no leg pain, no paresthesias, no paresis, no tingling and no weakness. Treatments tried: flexeril  The treatment provided no relief. Risk factors include obesity.  No f/c/r.  No weakness or numbness.  No bowel or bladder symptoms.  No gait abnormality, worse with sitting.  Wraps around to the abdomen.  States she is in significant pain but sleepy on exam.    Past Medical History:  Diagnosis Date  . Abdominal pain   . Arthritis    cervical disc degeneration/ oa left knee, carpal tunnel rt wrist, adhesive capsulitis right shoulder, rt hand weakness; lumbar degeneration  . Colon adenoma   . 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 Constipation   . Diabetes mellitus without complication ([place])    borderline - diet control  . Diverticulitis hx of  . Diverticulosis   . Frequent UTI    hx of urethral injury  during colon surgery - states frequent uti's since  . GERD (gastroesophageal reflux disease)   . H/O hiatal hernia   . History of palpitations    in the past  . History of shingles    has a lingering itching on back where shingles were  . Hyperlipidemia   . Hypertension   . Numbness and tingling in right hand    pt. states has numbness of right hand very frequently-watch positioning  . Obesity   . Osteoporosis   . Pain    pain left knee and pain right hip and right groin  . Personal history of colonic polyps-adenoma 08/26/2008  . Pneumonia 2005    Patient Active Problem List   Diagnosis Date Noted  . Hyperlipidemia 08/23/2014  . Type II or unspecified type diabetes mellitus without mention of complication, uncontrolled 06/19/2014  . Mixed hyperlipidemia 06/19/2014  . S/P urological surgery 09/30/2013  . Joint pain 09/30/2013  . UTI (urinary tract infection) 09/30/2013  . Cough 12/10/2012  . OA (osteoarthritis) of knee 01/05/2012  . Spasm of lumbar paraspinous muscle 06/17/2011  . Morbidly obese ([place]) 05/31/2011  . HIP PAIN, LEFT, CHRONIC 11/09/2010  . CARPAL TUNNEL SYNDROME, RIGHT 10/14/2010  . KNEE PAIN, RIGHT, CHRONIC 10/14/2010  . LEFT BUNDLE BRANCH BLOCK 07/26/2010  . 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 FATIGUE 07/01/2010  . CHEST PAIN, ATYPICAL 07/01/2010  . EPIGASTRIC PAIN 07/01/2010  . SUPRAPUBIC PAIN 07/01/2010  . HYPERGLYCEMIA 07/01/2010  .  CELLULITIS AND ABSCESS OF OTHER SPECIFIED SITE 02/15/2010  . LUMP OR MASS IN BREAST 01/10/2010  . DYSURIA, CHRONIC 03/09/2009  . GERD 10/06/2008  . Personal history of colonic polyps-adenoma 08/26/2008  . LOC OSTEOARTHROS NOT SPEC PRIM/SEC LOWER LEG 09/10/2007  . GOUT, UNSPECIFIED 09/09/2007  . DIVERTICULOSIS, COLON 09/09/2007  . DIVERTICULITIS, HX OF 09/09/2007  . HYPERLIPIDEMIA 06/03/2007  . Essential hypertension 06/03/2007    Past Surgical History:  Procedure Laterality Date  . ABDOMINAL HYSTERECTOMY      . APPENDECTOMY    . bladder tack    . BREAST SURGERY     breast duct resection- benign  . CARPAL TUNNEL RELEASE Right 06/04/2014   Procedure: RIGHT CARPAL TUNNEL RELEASE;  Surgeon: Roseanne Kaufman, MD;  Location: [place];  Service: Orthopedics;  Laterality: Right;  . COLON RESECTION  2008   hx diverticulosis  . COLON SURGERY    . colonoscopy [id number]    . JOINT REPLACEMENT    . OOPHORECTOMY    . skin graft left arm - traumatic compression injury left upper arm    . temporary ureter stent    . TOTAL KNEE ARTHROPLASTY  01/05/2012   Procedure: TOTAL KNEE ARTHROPLASTY;  Surgeon: [name], MD;  Location: [facility];  Service: Orthopedics;  Laterality: Right;  . TOTAL KNEE ARTHROPLASTY Left 08/17/2014   Procedure: LEFT TOTAL KNEE ARTHROPLASTY;  Surgeon: [name], MD;  Location: [facility];  Service: Orthopedics;  Laterality: Left;  . ureter repair for tyransected left ureter      OB History    No data available       Home Medications    Prior to Admission medications   Medication Sig Start Date End Date Taking? Authorizing Provider  acetaminophen (TYLENOL) 325 MG tablet Take 2 tablets (650 mg total) by mouth every 6 (six) hours as needed for mild pain (or Fever >/= 101). Patient not taking: Reported on 01/24/2015 08/18/14   Dara Lords, Alexzandrew L, PA-C  amLODipine-benazepril (LOTREL) 5-20 MG per capsule Take 1 capsule by mouth daily. @@ 11 am.    [provider]  aspirin EC 81 MG tablet Take 81 mg by mouth daily.    [provider]  atenolol-chlorthalidone (TENORETIC) 50-25 MG per tablet Take 1 tablet by mouth daily. @@ 11 am 12/09/14   Dutch Quint B, FNP  bisacodyl (DULCOLAX) 10 MG suppository Place 1 suppository (10 mg total) rectally daily as needed for moderate constipation. Patient not taking: Reported on 01/24/2015 08/18/14   Dara Lords, Alexzandrew L, PA-C  docusate sodium 100 MG CAPS Take 100 mg by mouth 2 (two) times daily. Patient  not taking: Reported on 01/24/2015 08/18/14   Dara Lords, Alexzandrew L, PA-C  famotidine (PEPCID) 20 MG tablet Take 20 mg by mouth daily. @ 11 am    [provider]  HYDROmorphone (DILAUDID) 2 MG tablet Take 1-2 tablets (2-4 mg total) by mouth every 4 (four) hours as needed for moderate pain or severe pain. Patient not taking: Reported on 01/24/2015 08/18/14   Dara Lords, Alexzandrew L, PA-C  indomethacin (INDOCIN) 50 MG capsule Take 1 capsule (50 mg total) by mouth 3 (three) times daily with meals. 01/04/16   Carmin Muskrat, MD  methocarbamol (ROBAXIN) 500 MG tablet Take 1 tablet (500 mg total) by mouth 3 (three) times daily. 01/24/15   Junius Creamer, NP  metoCLOPramide (REGLAN) 5 MG tablet Take 1 tablet (5 mg total) by mouth every 8 (eight) hours as needed for nausea (if ondansetron [facility])  ineffective.). Patient not taking: Reported on 01/24/2015 08/18/14   Dara Lords, Alexzandrew L, PA-C  ondansetron (ZOFRAN) 4 MG tablet Take 1 tablet (4 mg total) by mouth every 6 (six) hours as needed for nausea. Patient not taking: Reported on 01/24/2015 08/18/14   Dara Lords, Alexzandrew L, PA-C  [facility] DELICA LANCETS 38H MISC Use to check blood glucose once daily 06/19/14   Dutch Quint B, FNP  polyethylene glycol [facility] / GLYCOLAX) packet Take 17 g by mouth daily as needed for mild constipation. Patient not taking: Reported on 01/24/2015 08/18/14   Dara Lords, Alexzandrew L, PA-C  rivaroxaban (XARELTO) 10 MG TABS tablet Take 1 tablet (10 mg total) by mouth daily with breakfast. Take Xarelto for two and a half more weeks, then discontinue Xarelto. Once the patient has completed the Xarelto, they may resume the 81 mg Aspirin. Patient not taking: Reported on 01/24/2015 08/18/14   Dara Lords, Alexzandrew L, PA-C  rosuvastatin (CRESTOR) 20 MG tablet Take 20 mg by mouth 3 (three) times a week.  02/24/11   Ricard Dillon, MD  traMADol (ULTRAM) 50 MG tablet Take 1 tablet (50 mg total) by mouth 4 (four) times daily. 01/24/15    Junius Creamer, NP    Family History Family History  Problem Relation Age of Onset  . Breast cancer Mother   . Prostate cancer Father   . Colon cancer Father 90    Social History Social History  Substance Use Topics  . Smoking status: Never Smoker  . Smokeless tobacco: Never Used  . Alcohol use No     Allergies   Bactrim [sulfamethoxazole-trimethoprim]; Lipitor [atorvastatin calcium]; Other; Oxycodone; and Vicodin [hydrocodone-acetaminophen]   Review of Systems Review of Systems  Constitutional: Negative for diaphoresis, fever and weight loss.  HENT: Negative for drooling and voice change.   Respiratory: Negative for shortness of breath, wheezing and stridor.   Cardiovascular: Negative for chest pain, palpitations and leg swelling.  Gastrointestinal: Positive for abdominal pain. Negative for blood in stool, bowel incontinence, diarrhea, nausea, rectal pain and vomiting.  Genitourinary: Negative for bladder incontinence, difficulty urinating, dysuria and pelvic pain.  Musculoskeletal: Positive for back pain. Negative for gait problem, neck pain and neck stiffness.  Neurological: Negative for tingling, weakness, light-headedness, numbness, headaches and paresthesias.  All other systems reviewed and are negative.    Physical Exam Updated Vital Signs BP (!) 122/56 (BP Location: Right Arm)   Pulse 67   Temp 98.5 F (36.9 C) (Oral)   Resp 18   SpO2 95%   Physical Exam  Constitutional: She is oriented to person, place, and time. She appears well-developed and well-nourished. No distress.  Sleeping soundly but easily arouses.   HENT:  Head: Normocephalic and atraumatic.  Right Ear: External ear normal.  Left Ear: External ear normal.  Mouth/Throat: Oropharynx is clear and moist. No oropharyngeal exudate.  Eyes: Pupils are equal, round, and reactive to light. Conjunctivae and EOM are normal.  Neck: Normal range of motion. Neck supple. No JVD present.  Cardiovascular:  Normal rate, regular rhythm, normal heart sounds and intact distal pulses.   Pulmonary/Chest: Effort normal and breath sounds normal. No stridor. No respiratory distress. She has no wheezes. She has no rales.  Abdominal: Soft. Bowel sounds are normal. She exhibits no distension and no mass. There is no tenderness. There is no rebound and no guarding.  Musculoskeletal: Normal range of motion. She exhibits no tenderness or deformity.       Cervical back: Normal.  Thoracic back: Normal.       Lumbar back: Normal.  Intact strength and gate.   Neurological: She is alert and oriented to person, place, and time. She displays normal reflexes. She exhibits normal muscle tone. Coordination normal.  Skin: Skin is warm and dry. Capillary refill takes less than 2 seconds.  Psychiatric: She has a normal mood and affect.     ED Treatments / Results  Labs (all labs ordered are listed, but only abnormal results are displayed)  Results for orders placed or performed during the hospital encounter of 07/13/17  CBC with Differential/Platelet  Result Value Ref Range   WBC 10.9 (H) 4.0 - 10.5 K/uL   RBC 4.56 3.87 - 5.11 MIL/uL   Hemoglobin 13.7 12.0 - 15.0 g/dL   HCT 40.9 36.0 - 46.0 %   MCV 89.7 78.0 - 100.0 fL   MCH 30.0 26.0 - 34.0 pg   MCHC 33.5 30.0 - 36.0 g/dL   RDW 13.8 11.5 - 15.5 %   Platelets 206 150 - 400 K/uL   Neutrophils Relative % 74 %   Neutro Abs 8.0 (H) 1.7 - 7.7 K/uL   Lymphocytes Relative 14 %   Lymphs Abs 1.6 0.7 - 4.0 K/uL   Monocytes Relative 8 %   Monocytes Absolute 0.9 0.1 - 1.0 K/uL   Eosinophils Relative 4 %   Eosinophils Absolute 0.4 0.0 - 0.7 K/uL   Basophils Relative 0 %   Basophils Absolute 0.0 0.0 - 0.1 K/uL  Hepatic function panel  Result Value Ref Range   Total Protein 6.6 6.5 - 8.1 g/dL   Albumin 3.3 (L) 3.5 - 5.0 g/dL   AST 40 15 - 41 U/L   ALT 32 14 - 54 U/L   Alkaline Phosphatase 47 38 - 126 U/L   Total Bilirubin 1.2 0.3 - 1.2 mg/dL   Bilirubin,  Direct 0.2 0.1 - 0.5 mg/dL   Indirect Bilirubin 1.0 (H) 0.3 - 0.9 mg/dL  Lipase, blood  Result Value Ref Range   Lipase 20 11 - 51 U/L  Urinalysis, Routine w reflex microscopic  Result Value Ref Range   Color, Urine YELLOW YELLOW   APPearance CLEAR CLEAR   Specific Gravity, Urine 1.011 1.005 - 1.030   pH 5.0 5.0 - 8.0   Glucose, UA NEGATIVE NEGATIVE mg/dL   Hgb urine dipstick NEGATIVE NEGATIVE   Bilirubin Urine NEGATIVE NEGATIVE   Ketones, ur NEGATIVE NEGATIVE mg/dL   Protein, ur NEGATIVE NEGATIVE mg/dL   Nitrite NEGATIVE NEGATIVE   Leukocytes, UA NEGATIVE NEGATIVE  I-Stat Chem 8, ED  Result Value Ref Range   Sodium 138 135 - 145 mmol/L   Potassium 3.4 (L) 3.5 - 5.1 mmol/L   Chloride 99 (L) 101 - 111 mmol/L   BUN 23 (H) 6 - 20 mg/dL   Creatinine, Ser 1.10 (H) 0.44 - 1.00 mg/dL   Glucose, Bld 178 (H) 65 - 99 mg/dL   Calcium, Ion 1.17 1.15 - 1.40 mmol/L   TCO2 25 22 - 32 mmol/L   Hemoglobin 14.3 12.0 - 15.0 g/dL   HCT 42.0 36.0 - 46.0 %  I-stat troponin, ED  Result Value Ref Range   Troponin i, poc 0.00 0.00 - 0.08 ng/mL   Comment 3           Dg Chest 2 View  Result Date: 07/13/2017 CLINICAL DATA:  76 y/o  F; mid chest pain with dry cough. EXAM: CHEST  2 VIEW COMPARISON:  08/11/2014 chest radiograph FINDINGS:  Stable mild cardiomegaly given projection and technique. Stable elevated right hemidiaphragm. No focal consolidation. No pleural effusion or pneumothorax. No acute osseous abnormality is evident. IMPRESSION: Stable cardiomegaly and elevated right hemidiaphragm. No acute pulmonary process identified. Electronically Signed   By: Kristine Garbe M.D.   On: 07/13/2017 02:27   Ct Renal Stone Study  Result Date: 07/13/2017 CLINICAL DATA:  76 y/o  F; bilateral back pain. EXAM: CT ABDOMEN AND PELVIS WITHOUT CONTRAST TECHNIQUE: Multidetector CT imaging of the abdomen and pelvis was performed following the standard protocol without IV contrast. COMPARISON:  04/14/2009 CT  abdomen and pelvis. FINDINGS: Lower chest: Moderate coronary artery calcification. Mitral annular calcification. Hepatobiliary: Stable punctate calcifications in right lobe of liver the representing sequelae of prior granulomatous disease. No other focal liver lesion. Cholelithiasis. No intra or extrahepatic biliary ductal dilatation peer Pancreas: Unremarkable. No pancreatic ductal dilatation or surrounding inflammatory changes. Spleen: Normal in size without focal abnormality. Adrenals/Urinary Tract: Adrenal glands are unremarkable. Kidneys are normal, without renal calculi, focal lesion, or hydronephrosis. Bladder is unremarkable. Stomach/Bowel: Patent colorectal anastomosis. Sigmoid colon moderate diverticulosis. No findings of acute diverticulitis. No obstructive or inflammatory changes of bowel. Normal residual appendix. Vascular/Lymphatic: Aortic atherosclerosis. No enlarged abdominal or pelvic lymph nodes. Reproductive: Status post hysterectomy. No adnexal masses. Other: Several adjacent paraumbilical ventral abdominal hernias containing fat measuring up to 5.7 cm. Musculoskeletal: Progression of advanced lumbar spine degenerative changes with grade 1 L3-4 and L4-5 anterolisthesis and multilevel disc space narrowing greatest at L3-4 where it is moderate to severe. Multifactorial canal stenosis moderate to severe at the L3-4 and L4-5 levels. No acute osseous abnormality is evident. IMPRESSION: 1. No acute process identified. 2. Moderate coronary artery and aortic calcific atherosclerosis. 3. Cholelithiasis. 4. Moderate sigmoid colon diverticulosis. 5. Several adjacent paraumbilical ventral abdominal hernias containing fat measuring up to 5.7 cm, minimally increased in size. 6. Progression of lumbar degenerative changes with multifactorial moderate to severe stenosis at the L3-4 and L4-5 levels. Electronically Signed   By: Kristine Garbe M.D.   On: 07/13/2017 02:16    EKG  EKG  Interpretation  Date/Time:  Friday July 13 2017 01:31:52 EDT Ventricular Rate:  66 PR Interval:    QRS Duration: 102 QT Interval:  375 QTC Calculation: 393 R Axis:   -11 Text Interpretation:  Sinus rhythm Nonspecific repol abnormality, diffuse leads Confirmed by Dory Horn) on 07/13/2017 1:36:35 AM       Radiology Dg Chest 2 View  Result Date: 07/13/2017 CLINICAL DATA:  76 y/o  F; mid chest pain with dry cough. EXAM: CHEST  2 VIEW COMPARISON:  08/11/2014 chest radiograph FINDINGS: Stable mild cardiomegaly given projection and technique. Stable elevated right hemidiaphragm. No focal consolidation. No pleural effusion or pneumothorax. No acute osseous abnormality is evident. IMPRESSION: Stable cardiomegaly and elevated right hemidiaphragm. No acute pulmonary process identified. Electronically Signed   By: Kristine Garbe M.D.   On: 07/13/2017 02:27   Ct Renal Stone Study  Result Date: 07/13/2017 CLINICAL DATA:  76 y/o  F; bilateral back pain. EXAM: CT ABDOMEN AND PELVIS WITHOUT CONTRAST TECHNIQUE: Multidetector CT imaging of the abdomen and pelvis was performed following the standard protocol without IV contrast. COMPARISON:  04/14/2009 CT abdomen and pelvis. FINDINGS: Lower chest: Moderate coronary artery calcification. Mitral annular calcification. Hepatobiliary: Stable punctate calcifications in right lobe of liver the representing sequelae of prior granulomatous disease. No other focal liver lesion. Cholelithiasis. No intra or extrahepatic biliary ductal dilatation peer Pancreas: Unremarkable. No pancreatic ductal dilatation  or surrounding inflammatory changes. Spleen: Normal in size without focal abnormality. Adrenals/Urinary Tract: Adrenal glands are unremarkable. Kidneys are normal, without renal calculi, focal lesion, or hydronephrosis. Bladder is unremarkable. Stomach/Bowel: Patent colorectal anastomosis. Sigmoid colon moderate diverticulosis. No findings of acute  diverticulitis. No obstructive or inflammatory changes of bowel. Normal residual appendix. Vascular/Lymphatic: Aortic atherosclerosis. No enlarged abdominal or pelvic lymph nodes. Reproductive: Status post hysterectomy. No adnexal masses. Other: Several adjacent paraumbilical ventral abdominal hernias containing fat measuring up to 5.7 cm. Musculoskeletal: Progression of advanced lumbar spine degenerative changes with grade 1 L3-4 and L4-5 anterolisthesis and multilevel disc space narrowing greatest at L3-4 where it is moderate to severe. Multifactorial canal stenosis moderate to severe at the L3-4 and L4-5 levels. No acute osseous abnormality is evident. IMPRESSION: 1. No acute process identified. 2. Moderate coronary artery and aortic calcific atherosclerosis. 3. Cholelithiasis. 4. Moderate sigmoid colon diverticulosis. 5. Several adjacent paraumbilical ventral abdominal hernias containing fat measuring up to 5.7 cm, minimally increased in size. 6. Progression of lumbar degenerative changes with multifactorial moderate to severe stenosis at the L3-4 and L4-5 levels. Electronically Signed   By: Kristine Garbe M.D.   On: 07/13/2017 02:16    Procedures Procedures (including critical care time)  Medications Ordered in ED Medications  ketorolac (TORADOL) 30 MG/ML injection 15 mg (15 mg Intravenous Given 07/13/17 0343)      Final Clinical Impressions(s) / ED Diagnoses  Patient informed of coronary artery calcifications and need to follow up with cardiology for ongoing testing and management.  Patient and husband and verbalize understanding and agree to follow up.  Informed of spinal stenosis and need for follow up.  Will change flexeril to robaxin and place on lidoderm.  Normal gait and strength no indication for MRI at this time.  No point tenderness of the spine.   Strict return precautions given for  chest pain, dyspnea on exertion, new weakness or numbness changes in vision or speech,   Inability to tolerate liquids or food, changes in voice cough, altered mental status or any concerns. No signs of systemic illness or infection. The patient is nontoxic-appearing on exam and vital signs are within normal limits.    I have reviewed the triage vital signs and the nursing notes. Pertinent labs &imaging results that were available during my care of the patient were reviewed by me and considered in my medical decision making (see chart for details).  After history, exam, and medical workup I feel the patient has been appropriately medically screened and is safe for discharge home. Pertinent diagnoses were discussed with the patient. Patient was given return precautions.  New Prescriptions New Prescriptions   No medications on file     Oley Lahaie, MD 07/13/17 [phone]

## 2017-07-17 ENCOUNTER — Ambulatory Visit
Admission: RE | Admit: 2017-07-17 | Discharge: 2017-07-17 | Disposition: A | Discharge status: Home / Self Care | Payer: Medicare Other | Source: Ambulatory Visit | Attending: Internal Medicine | Admitting: Internal Medicine

## 2017-07-17 DIAGNOSIS — K469 Unspecified abdominal hernia without obstruction or gangrene: Secondary | ICD-10-CM

## 2017-07-17 DIAGNOSIS — R109 Unspecified abdominal pain: Secondary | ICD-10-CM

## 2017-07-17 DIAGNOSIS — K8020 Calculus of gallbladder without cholecystitis without obstruction: Secondary | ICD-10-CM | POA: Diagnosis not present

## 2017-07-17 MED ORDER — IOPAMIDOL (ISOVUE-300) INJECTION 61%
100.0000 mL | Freq: Once | INTRAVENOUS | Status: AC | PRN
  Administered 2017-07-17: 100 mL via INTRAVENOUS

## 2017-07-18 DIAGNOSIS — I2584 Coronary atherosclerosis due to calcified coronary lesion: Secondary | ICD-10-CM | POA: Diagnosis not present

## 2017-07-18 DIAGNOSIS — M48061 Spinal stenosis, lumbar region without neurogenic claudication: Secondary | ICD-10-CM | POA: Diagnosis not present

## 2017-07-18 DIAGNOSIS — R0689 Other abnormalities of breathing: Secondary | ICD-10-CM | POA: Diagnosis not present

## 2017-07-18 DIAGNOSIS — J069 Acute upper respiratory infection, unspecified: Secondary | ICD-10-CM | POA: Diagnosis not present

## 2017-07-18 DIAGNOSIS — I700 Atherosclerosis of aorta: Secondary | ICD-10-CM | POA: Diagnosis not present

## 2017-07-18 IMAGING — CT CT ABD-PELV W/ CM
2 of 5 series · 15 of 46 positions shown, 17 images · IV contrast (APPLIED)
Comparison: [DATE] and [DATE] CT abdomen pelvis.

CLINICAL DATA: 76-year-old hypertensive female with generalized
abdominal pain for 5 days. Rule out hernia. Partial colonic
resection secondary to diverticulitis. Prior total hysterectomy and
appendectomy. Ureteral repair for transected left ureter. Subsequent
encounter.

EXAM:
CT ABDOMEN AND PELVIS WITH CONTRAST
TECHNIQUE: Multidetector CT imaging of the abdomen and pelvis was performed
using the standard protocol following bolus administration of
intravenous contrast.
CONTRAST:  100mL [JZ] IOPAMIDOL ([JZ]) INJECTION 61%

[Series 2: abd/pelvis w/cm · axial · 0.86mm/px · z∈[+765,+1240]mm · 12 of 107 slices shown, 14 images]
[im 6/107  soft-tissue]
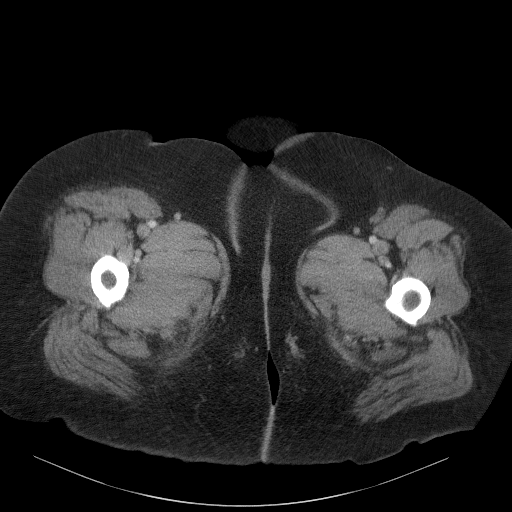
[im 6/107  bone]
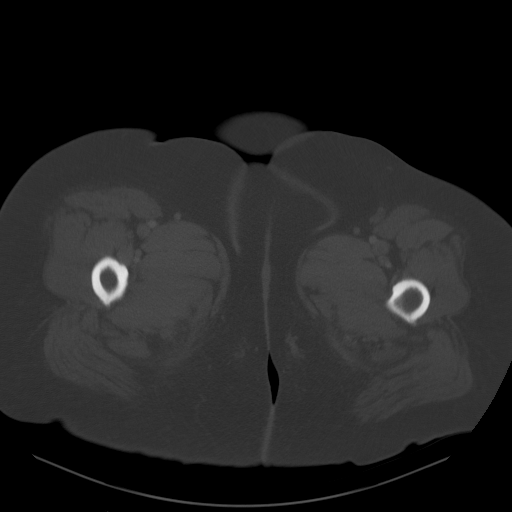
[im 17/107  soft-tissue]
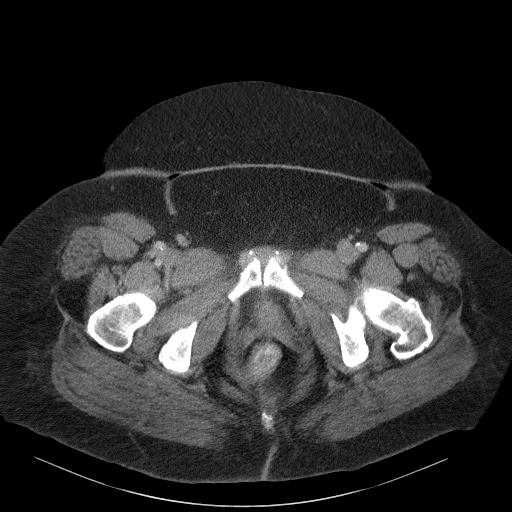
[im 23/107  soft-tissue]
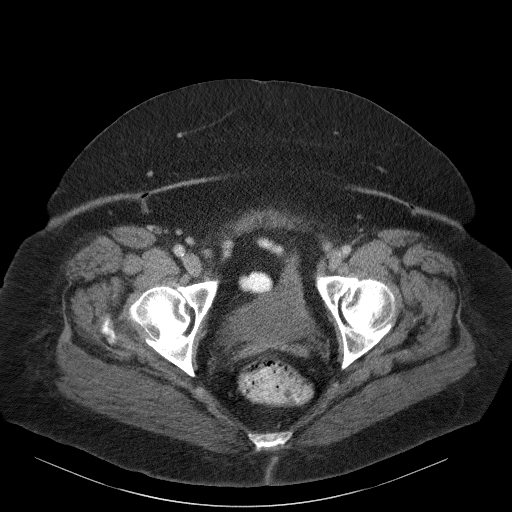
[im 34/107  soft-tissue]
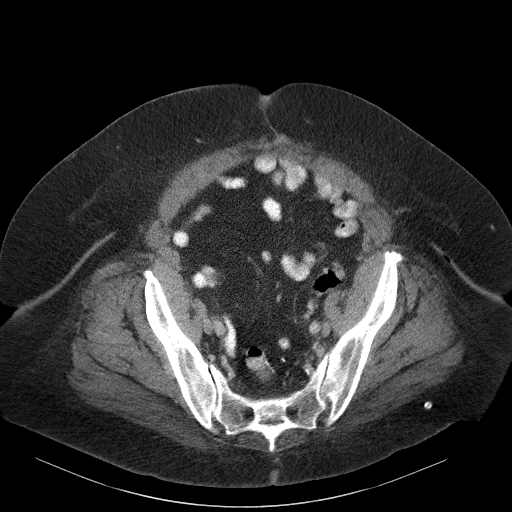
[im 40/107  soft-tissue]
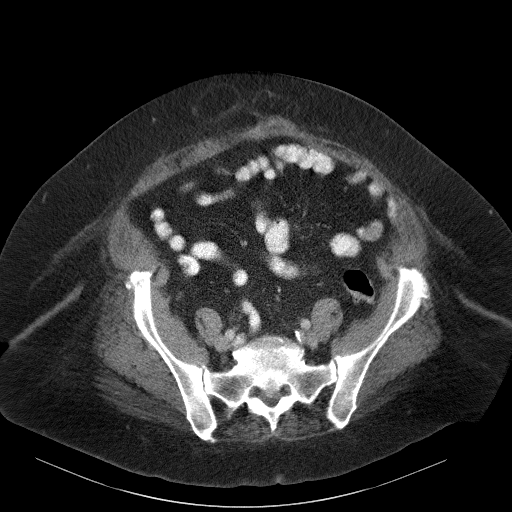
[im 51/107  soft-tissue]
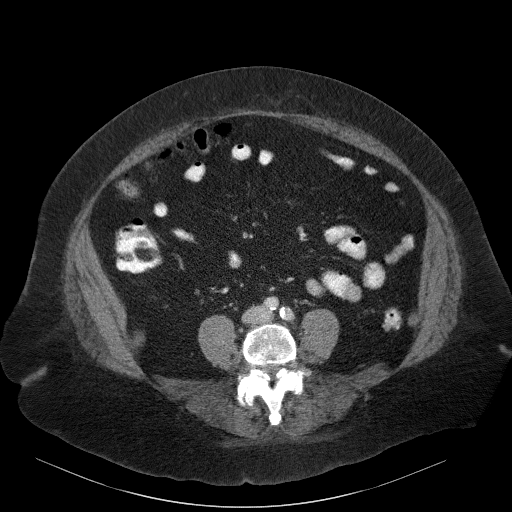
[im 56/107  soft-tissue]
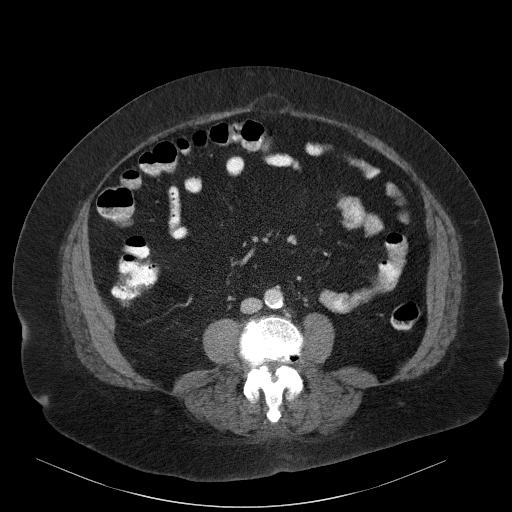
[im 67/107  soft-tissue]
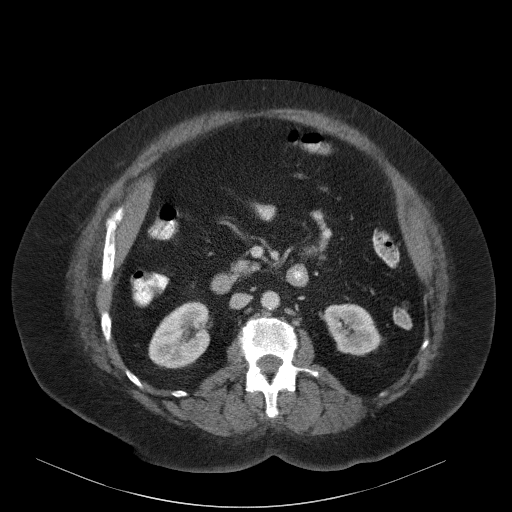
[im 73/107  soft-tissue]
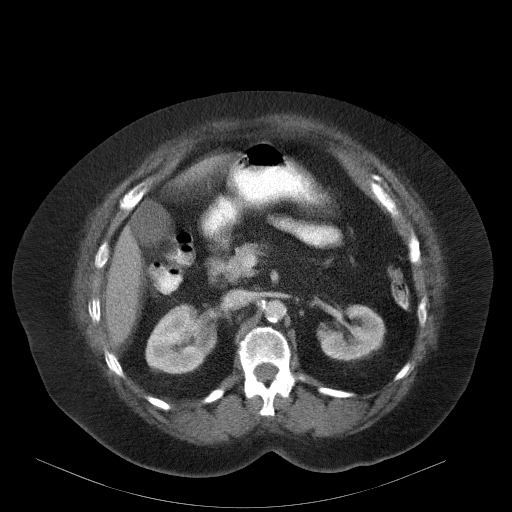
[im 73/107  bone]
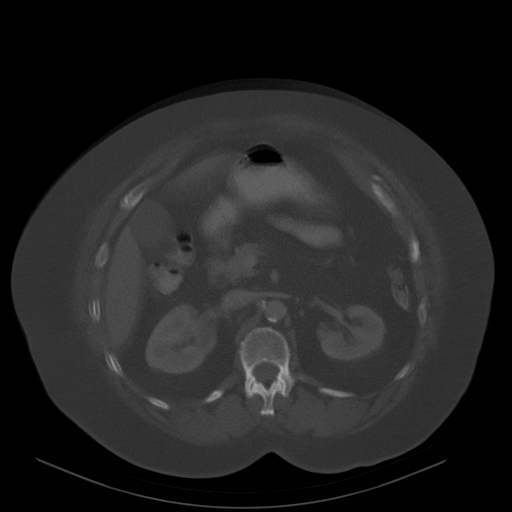
[im 84/107  soft-tissue]
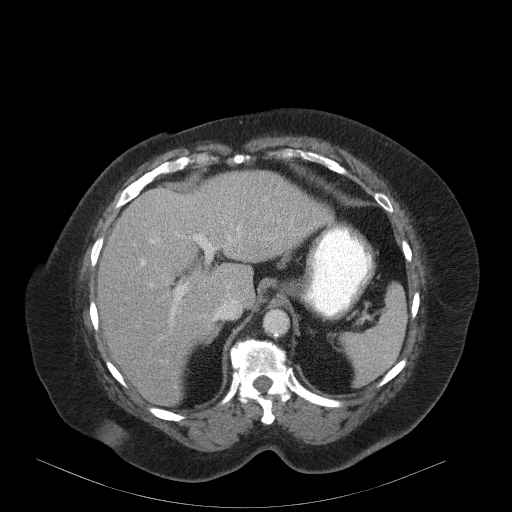
[im 90/107  soft-tissue]
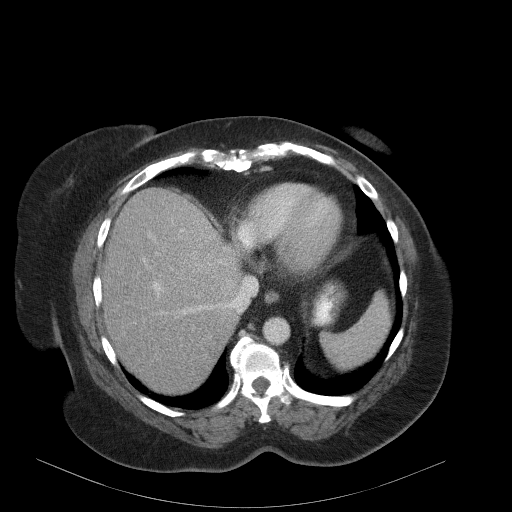
[im 101/107  soft-tissue]
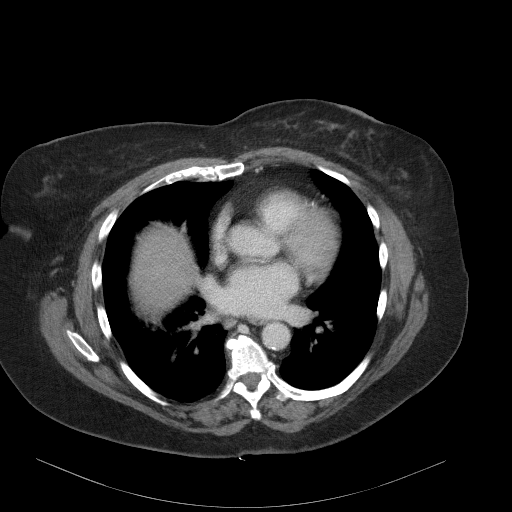

[Series 3: cor · coronal · 0.79mm/px · 3 of 117 slices shown]
[im 39/117  soft-tissue]
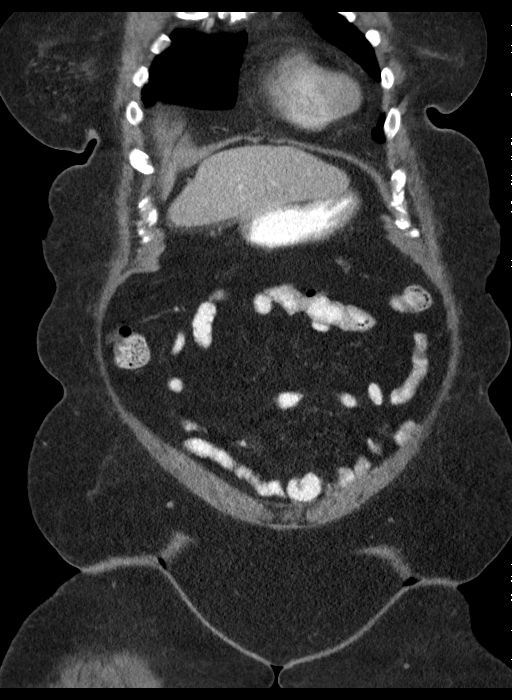
[im 52/117  soft-tissue]
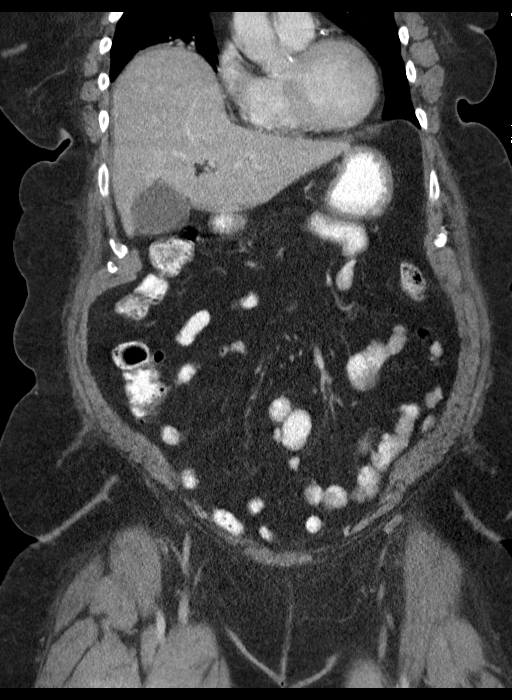
[im 65/117  soft-tissue]
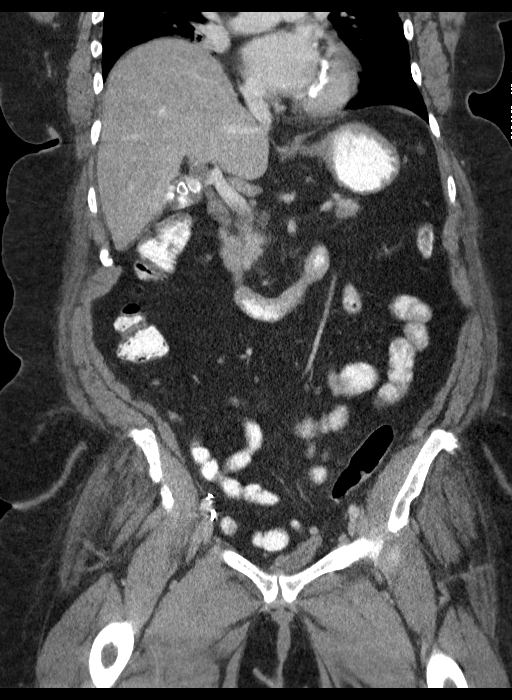

[15 of 46 positions shown; findings below may reference images not displayed]

FINDINGS: Lower chest: Minimal atelectasis/scarring lung bases. Prominent 3
vessel coronary artery calcification. Calcification aortic and
mitral valve. Heart size top-normal.

Hepatobiliary: Fatty liver without worrisome mass. Nonspecific tiny
calcification right lobe.

Gallstones without CT evidence of inflammation. If this were of
concern ultrasound may then be considered.

Pancreas: No worrisome pancreatic mass or inflammation.

Spleen: No splenic mass or enlargement.

Adrenals/Urinary Tract: No obstructing stone or hydronephrosis. No
renal or adrenal mass. Decompressed noncontrast filled urinary
bladder without gross abnormality.

Stomach/Bowel: Prior colonic resection with reanastomosis sigmoid
colon region. Scattered diverticula descending colon and sigmoid
colon without evidence of inflammation. No obvious colonic mass
although evaluation somewhat limited by areas of under distension.

No gastric or small bowel abnormality.

Vascular/Lymphatic: Atherosclerotic changes aorta and aortic branch
vessels. No aortic aneurysm or large vessel occlusion.

Upper abdominal adenopathy (porta hepatis, peripancreatic, inter
aortocaval and celiac axis region) of indeterminate etiology and
similar to [JZ] exam.

Reproductive: Post hysterectomy without adnexal mass.

Other: Anterior abdominal wall fat and vessel containing hernias.
Largest abdominal wall defect spans over 5.6 cm. Similar to prior
exams. No bowel containing hernia.

Musculoskeletal: Degenerative changes most notable lower lumbar
spine (L3-4 and L5-S1 level). Multifactorial marked spinal stenosis
L3-4 and L4-5.
IMPRESSION: Anterior abdominal wall fat and vessel containing hernias. Largest
abdominal wall defect spans over 5.6 cm. Similar to prior exams. No
bowel containing hernia.

Prior colonic resection with reanastomosis sigmoid colon region.
Descending colon and sigmoid colon diverticulosis.

Upper abdominal adenopathy (porta hepatis, peripancreatic, inter
aortocaval and celiac axis region) of indeterminate etiology and
similar to [JZ] exam.

Gallstones without CT evidence of inflammation. If this were of
concern ultrasound may then be considered.

Fatty liver.

Aortic Atherosclerosis ([JZ]-[JZ]). Coronary artery
calcifications.

Multifactorial marked spinal stenosis L3-4 and L4-5.

Post hysterectomy.

## 2017-07-30 ENCOUNTER — Telehealth: Payer: Self-pay | Admitting: Cardiovascular Disease

## 2017-07-30 NOTE — Telephone Encounter (Signed)
Received records from [facility] for appointment on 07/31/17 with Dr Oval Linsey.  Records put with Dr Blenda Mounts schedule for 07/31/17. lp

## 2017-07-31 ENCOUNTER — Ambulatory Visit (INDEPENDENT_AMBULATORY_CARE_PROVIDER_SITE_OTHER): Payer: Medicare Other | Admitting: Cardiovascular Disease

## 2017-07-31 ENCOUNTER — Encounter: Payer: Self-pay | Admitting: Cardiovascular Disease

## 2017-07-31 ENCOUNTER — Telehealth: Payer: Self-pay | Admitting: *Deleted

## 2017-07-31 VITALS — BP 165/82 | HR 66 | Ht 69.0 in | Wt 258.0 lb

## 2017-07-31 DIAGNOSIS — I6529 Occlusion and stenosis of unspecified carotid artery: Secondary | ICD-10-CM

## 2017-07-31 DIAGNOSIS — I2584 Coronary atherosclerosis due to calcified coronary lesion: Secondary | ICD-10-CM | POA: Diagnosis not present

## 2017-07-31 DIAGNOSIS — E785 Hyperlipidemia, unspecified: Secondary | ICD-10-CM

## 2017-07-31 DIAGNOSIS — I2510 Atherosclerotic heart disease of native coronary artery without angina pectoris: Secondary | ICD-10-CM

## 2017-07-31 DIAGNOSIS — R0602 Shortness of breath: Secondary | ICD-10-CM | POA: Diagnosis not present

## 2017-07-31 DIAGNOSIS — I10 Essential (primary) hypertension: Secondary | ICD-10-CM | POA: Diagnosis not present

## 2017-07-31 DIAGNOSIS — E7800 Pure hypercholesterolemia, unspecified: Secondary | ICD-10-CM | POA: Diagnosis not present

## 2017-07-31 DIAGNOSIS — Z5181 Encounter for therapeutic drug level monitoring: Secondary | ICD-10-CM

## 2017-07-31 MED ORDER — ROSUVASTATIN CALCIUM 20 MG PO TABS: 20.0000 mg | ORAL_TABLET | Freq: Every day | ORAL | 3 refills | Status: DC

## 2017-07-31 NOTE — Telephone Encounter (Signed)
Patient is only taking Crestor 10 mg 5 days a week. Discussed further with Dr Oval Linsey and will just increase Crestor to 20 mg daily and recheck labs is 6 weeks. Advised patient, verbalized understanding.

## 2017-07-31 NOTE — Telephone Encounter (Signed)
Returning your call from today. °

## 2017-07-31 NOTE — Patient Instructions (Signed)
Medication Instructions:  Your physician recommends that you continue on your current medications as directed. Please refer to the Current Medication list given to you today.  Labwork: none  Testing/Procedures: Your physician has requested that you have a lexiscan myoview. For further information please visit [email or web address]. Please follow instruction sheet, as given.  Your physician has requested that you have a carotid duplex. This test is an ultrasound of the carotid arteries in your neck. It looks at blood flow through these arteries that supply the brain with blood. Allow one hour for this exam. There are no restrictions or special instructions.  Follow-Up: Your physician wants you to follow-up in: 6 month ov You will receive a reminder letter in the mail two months in advance. If you don't receive a letter, please call our office to schedule the follow-up appointment.  Any Other Special Instructions Will Be Listed Below (If Applicable).

## 2017-07-31 NOTE — Progress Notes (Signed)
Cardiology Office Note   Date:  07/31/2017   ID:  [name], DOB 01-31-1941, MRN [id number]  PCP:  [name], MD  Cardiologist:   Skeet Latch, MD   Chief Complaint  Patient presents with  . New Patient (Initial Visit)      History of Present Illness: [name] is a 76 y.o. female with hypertension, hyperlipidemia, diabetes, mild-moderate carotid stenosis, and morbid obesity who presents for evaluation of coronary calcifications seen on CT.  [name] saw Dr. Dr. Baird Cancer for follow up on bronichitis after being seen in the ED 07/13/17.  She had a CT renal stone protocol and was noted to have moderate coronary artery calcification and mitral annular calcification.  Overall she has been feeling well. She continues to feel weak after her upper respiratory infection. She has not noted any chest pain or shortness of breath. She was mildly short of breath while dealing with bronchitis but this has resolved. She has noted some lower extremity edema and eating salty foods. Otherwise this is not at a shoe. She denies orthopnea or PND. Rev. Bither has not been exercising much lately. 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 She mostly sits while at work.  She plans to start prioritizing her own health and wants to join [employer] program at the [facility].  Rev. Porco had carotid Dopplers 06/16/14 that showed 1-39% ICA stenosis and >50% bilateral ECA stenosis.  She reports that her blood pressure is typically well-controlled. From 629 to 476 systolic.It usually runs  She has not taken her blood pressure medication yet.     Past Medical History:  Diagnosis Date  . Abdominal pain   . Arthritis    cervical disc degeneration/ oa left knee, carpal tunnel rt wrist, adhesive capsulitis right shoulder, rt hand weakness; lumbar degeneration  . Colon adenoma   . Complication of anesthesia 2006-at [facility]   breathing problems-no BP med given prior to  surgery;  hx of being very sleepy after colon surgery --  states no problems with last right total knee replacement 2013  . Constipation   . Diabetes mellitus without complication ([place])    borderline - diet control  . Diverticulitis hx of  . Diverticulosis   . Frequent UTI    hx of urethral injury during colon surgery - states frequent uti's since  . GERD (gastroesophageal reflux disease)   . H/O hiatal hernia   . History of palpitations    in the past  . History of shingles    has a lingering itching on back where shingles were  . Hyperlipidemia   . Hypertension   . Numbness and tingling in right hand    pt. states has numbness of right hand very frequently-watch positioning  . Obesity   . Osteoporosis   . Pain    pain left knee and pain right hip and right groin  . Personal history of colonic polyps-adenoma 08/26/2008  . Pneumonia 2005    Past Surgical History:  Procedure Laterality Date  . ABDOMINAL HYSTERECTOMY    . APPENDECTOMY    . bladder tack    . BREAST SURGERY     breast duct resection- benign  . CARPAL TUNNEL RELEASE Right 06/04/2014   Procedure: RIGHT CARPAL TUNNEL RELEASE;  Surgeon: Roseanne Kaufman, MD;  Location: [place];  Service: Orthopedics;  Laterality: Right;  . COLON RESECTION  2008   hx diverticulosis  .  COLON SURGERY    . colonoscopy [id number]    . JOINT REPLACEMENT    . OOPHORECTOMY    . skin graft left arm - traumatic compression injury left upper arm    . temporary ureter stent    . TOTAL KNEE ARTHROPLASTY  01/05/2012   Procedure: TOTAL KNEE ARTHROPLASTY;  Surgeon: [name], MD;  Location: [facility];  Service: Orthopedics;  Laterality: Right;  . TOTAL KNEE ARTHROPLASTY Left 08/17/2014   Procedure: LEFT TOTAL KNEE ARTHROPLASTY;  Surgeon: [name], MD;  Location: [facility];  Service: Orthopedics;  Laterality: Left;  . ureter repair for tyransected left ureter       Current Outpatient Prescriptions  Medication Sig  Dispense Refill  . amLODipine-benazepril (LOTREL) 5-20 MG per capsule Take 1 capsule by mouth daily. @@ 11 am.    . aspirin EC 81 MG tablet Take 81 mg by mouth daily.    Marland Kitchen atenolol-chlorthalidone (TENORETIC) 50-25 MG per tablet Take 1 tablet by mouth daily. @@ 11 am 30 tablet 0  . famotidine (PEPCID) 20 MG tablet Take 20 mg by mouth daily. @ 11 am    . indomethacin (INDOCIN) 50 MG capsule Take 1 capsule (50 mg total) by mouth 3 (three) times daily with meals. 30 capsule 0  . lidocaine (LIDODERM) 5 % Place 1 patch onto the skin daily. Remove & Discard patch within 12 hours or as directed by MD 14 patch 0  . ONETOUCH DELICA LANCETS 87F MISC Use to check blood glucose once daily 100 each 3  . rosuvastatin (CRESTOR) 20 MG tablet Take 20 mg by mouth 3 (three) times a week.      No current facility-administered medications for this visit.     Allergies:   Bactrim [sulfamethoxazole-trimethoprim]; Lipitor [atorvastatin calcium]; Other; Oxycodone; and Vicodin [hydrocodone-acetaminophen]    Social History:  The patient  reports that she has never smoked. She has never used smokeless tobacco. She reports that she does not drink alcohol or use drugs.   Family History:  The patient's family history includes Arthritis in her sister and sister; Breast cancer in her mother; Cancer in her maternal grandmother; Colon cancer (age of onset: 85) in her father; Dementia in her sister; Hypertension in her child, child, child, mother, and sister; Lung cancer in her brother; Prostate cancer in her father.    ROS:  Please see the history of present illness.   Otherwise, review of systems are positive for insomnia.   All other systems are reviewed and negative.    PHYSICAL EXAM: VS:  BP (!) 165/82   Pulse 66   Ht 5\' 9"  (1.753 m)   Wt 117 kg (258 lb)   BMI 38.10 kg/m  , BMI Body mass index is 38.1 kg/m. GENERAL:  Well appearing HEENT:  Pupils equal round and reactive, fundi not visualized, oral mucosa  unremarkable NECK:  No jugular venous distention, waveform within normal limits, carotid upstroke brisk and symmetric, no bruits, no thyromegaly LYMPHATICS:  No cervical adenopathy LUNGS:  Clear to auscultation bilaterally HEART:  RRR.  PMI not displaced or sustained,S1 and S2 within normal limits, no S3, no S4, no clicks, no rubs, no murmurs ABD:  Flat, positive bowel sounds normal in frequency in pitch, no bruits, no rebound, no guarding, no midline pulsatile mass, no hepatomegaly, no splenomegaly EXT:  2 plus pulses throughout, no edema, no cyanosis no clubbing SKIN:  No rashes no nodules NEURO:  Cranial nerves II through XII grossly intact, motor  grossly intact throughout [facility]:  Cognitively intact, oriented to person place and time    EKG:  EKG is ordered today. The ekg ordered today demonstrates sinus rhythm.  Rate 66 bpm.  LAD.  Non-specific T wave abnormalities.     Recent Labs: 07/13/2017: ALT 32; BUN 23; Creatinine, Ser 1.10; Hemoglobin 14.3; Platelets 206; Potassium 3.4; Sodium 138   06/04/17: Total cholesterol 245, triglycerides, HDL 46, LDL 166 Hemoglobin A1c 6.8% TSH 2.27 Sodium 140, potassium 4.2, BUN 19, creatinine  Lipid Panel    Component Value Date/Time   CHOL 234 (H) 06/09/2014 0923   TRIG 153.0 (H) 06/09/2014 0923   HDL 34.70 (L) 06/09/2014 0923   CHOLHDL 7 06/09/2014 0923   VLDL 30.6 06/09/2014 0923   LDLCALC 169 (H) 06/09/2014 0923   LDLDIRECT 162.2 11/21/2013 1135      Wt Readings from Last 3 Encounters:  07/31/17 117 kg (258 lb)  01/17/16 118.2 kg (260 lb 9.6 oz)  01/24/15 116.6 kg (257 lb)      ASSESSMENT AND PLAN:  # Coronary calcifications: # Shortness of breath: # Hyperlipidemia: [name] is relatively asymtpomatic but she is not physically active.  We will get a Lexiscan Myoview to assess for ischemia.  If negative, we will encourage her to exercise at least 30-40 minutes most days of the week. Continue aspirin.  Increase rosuvastatin to  40 mg daily.  Goal LDL is <70.  Repeat lipids and CMP in 6 weeks.   # Carotid stenosis:  Mild-moderate in 2015.  Repeat carotid Dopplers.  Aspirin and statin as above.   # Hypertension: BP above goal but better on repeat. She has not yet taken her blood pressure medication today. We will not make any changes at this time.    Current medicines are reviewed at length with the patient today.  The patient does not have concerns regarding medicines.  The following changes have been made:  Increase rosuvastatin  Labs/ tests ordered today include:   Orders Placed This Encounter  Procedures  . MYOCARDIAL PERFUSION IMAGING  . EKG 12-Lead     Disposition:   FU with Nelissa Bolduc C. Oval Linsey, MD, [facility] in 6 months.   This note was written with the assistance of speech recognition software.  Please excuse any transcriptional errors.  Signed, Archimedes Harold C. Oval Linsey, MD, [facility]  07/31/2017 2:21 PM    [place]

## 2017-07-31 NOTE — Telephone Encounter (Signed)
Labs received after patient left visit today.  Dr Oval Linsey reviewed labs and LDL not at goal and would like patient to start Crestor 40 mg daily fasting LP/HFP in 6 weeks Left message to call back

## 2017-08-03 ENCOUNTER — Telehealth: Payer: Self-pay | Admitting: Cardiovascular Disease

## 2017-08-03 NOTE — Telephone Encounter (Signed)
Faxed Release signed by patient to Dr [facility] to obtain records per Dr Oval Linsey.

## 2017-08-07 ENCOUNTER — Telehealth (HOSPITAL_COMMUNITY): Payer: Self-pay

## 2017-08-07 NOTE — Telephone Encounter (Signed)
Encounter complete. 

## 2017-08-08 ENCOUNTER — Telehealth (HOSPITAL_COMMUNITY): Payer: Self-pay

## 2017-08-08 NOTE — Telephone Encounter (Signed)
Encounter complete. 

## 2017-08-09 ENCOUNTER — Ambulatory Visit (HOSPITAL_COMMUNITY)
Admission: RE | Admit: 2017-08-09 | Discharge: 2017-08-09 | Disposition: A | Discharge status: Home / Self Care | Payer: Medicare Other | Source: Ambulatory Visit | Attending: Cardiovascular Disease | Admitting: Cardiovascular Disease

## 2017-08-09 DIAGNOSIS — I2510 Atherosclerotic heart disease of native coronary artery without angina pectoris: Secondary | ICD-10-CM | POA: Diagnosis not present

## 2017-08-09 DIAGNOSIS — R0602 Shortness of breath: Secondary | ICD-10-CM | POA: Insufficient documentation

## 2017-08-09 DIAGNOSIS — I2584 Coronary atherosclerosis due to calcified coronary lesion: Secondary | ICD-10-CM | POA: Insufficient documentation

## 2017-08-09 IMAGING — NM NM MISC PROCEDURE
9 series · 54 of 54 positions shown · non-contrast
Comparison: none

[Series 1: wbr_s-proj_st wbr stress-gsp · 6.40mm/px · 6 of 512 frames shown]
[frame 43/512]
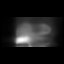
[frame 128/512]
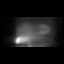
[frame 214/512]
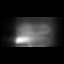
[frame 299/512]
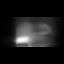
[frame 384/512]
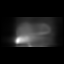
[frame 470/512]
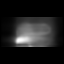

[Series 1: stress sax · 6.4mm · 6.40mm/px · 6 of 21 frames shown]
[frame 2/21]
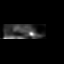
[frame 6/21]
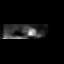
[frame 9/21]
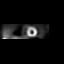
[frame 13/21]
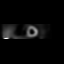
[frame 16/21]
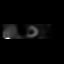
[frame 20/21]
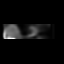

[Series 1: stress sax gs · 6.4mm · 6.40mm/px · 6 of 168 frames shown]
[frame 15/168]
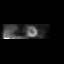
[frame 43/168]
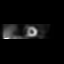
[frame 71/168]
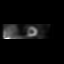
[frame 99/168]
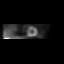
[frame 127/168]
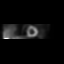
[frame 155/168]
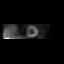

[Series 1: wbr stress-gsp · 6.40mm/px · 6 of 512 frames shown]
[frame 43/512]
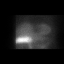
[frame 128/512]
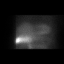
[frame 214/512]
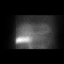
[frame 299/512]
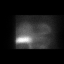
[frame 384/512]
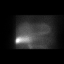
[frame 470/512]
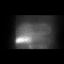

[Series 2: wbr stress-sum-em · 6.40mm/px · 6 of 64 frames shown]
[frame 6/64]
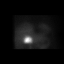
[frame 16/64]
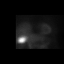
[frame 27/64]
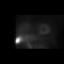
[frame 38/64]
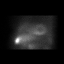
[frame 48/64]
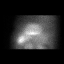
[frame 59/64]
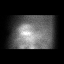

[Series 2: wbr_s-proj_st wbr stress-sum-em · 6.40mm/px · 6 of 64 frames shown]
[frame 6/64]
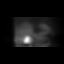
[frame 16/64]
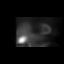
[frame 27/64]
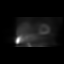
[frame 38/64]
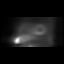
[frame 48/64]
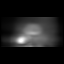
[frame 59/64]
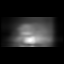

[Series 3: wbr rest · 6.40mm/px · 6 of 64 frames shown]
[frame 6/64]
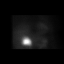
[frame 16/64]
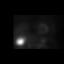
[frame 27/64]
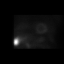
[frame 38/64]
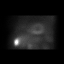
[frame 48/64]
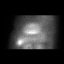
[frame 59/64]
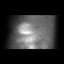

[Series 3: wbr_r-proj_st wbr rest · 6.40mm/px · 6 of 64 frames shown]
[frame 6/64]
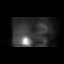
[frame 16/64]
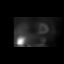
[frame 27/64]
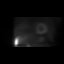
[frame 38/64]
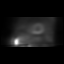
[frame 48/64]
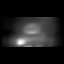
[frame 59/64]
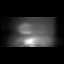

[Series 3: rest sax · 6.4mm · 6.40mm/px · 6 of 21 frames shown]
[frame 2/21]
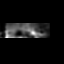
[frame 6/21]
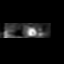
[frame 9/21]
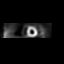
[frame 13/21]
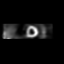
[frame 16/21]
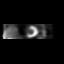
[frame 20/21]
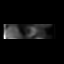

[54 of 54 positions shown; findings below may reference images not displayed]

Canned report from images found in remote index.

Refer to host system for actual result text.

## 2017-08-09 MED ORDER — REGADENOSON 0.4 MG/5ML IV SOLN
0.4000 mg | Freq: Once | INTRAVENOUS | Status: AC
  Administered 2017-08-09: 0.4 mg via INTRAVENOUS

## 2017-08-09 MED ORDER — TECHNETIUM TC 99M TETROFOSMIN IV KIT
30.4000 | PACK | Freq: Once | INTRAVENOUS | Status: AC | PRN
  Administered 2017-08-09: 30.4 via INTRAVENOUS
  Filled 2017-08-09: qty 31

## 2017-08-10 ENCOUNTER — Telehealth: Payer: Self-pay | Admitting: Cardiovascular Disease

## 2017-08-10 ENCOUNTER — Ambulatory Visit (HOSPITAL_COMMUNITY)
Admission: RE | Admit: 2017-08-10 | Discharge: 2017-08-10 | Disposition: A | Discharge status: Home / Self Care | Payer: Medicare Other | Source: Ambulatory Visit | Attending: Cardiology | Admitting: Cardiology

## 2017-08-10 LAB — MYOCARDIAL PERFUSION IMAGING
LV dias vol: 103 mL (ref 46–106)
LV sys vol: 52 mL
Peak HR: 80 {beats}/min
Rest HR: 61 {beats}/min
SDS: 4
SRS: 2
SSS: 6
TID: 1

## 2017-08-10 MED ORDER — TECHNETIUM TC 99M TETROFOSMIN IV KIT: 30.4000 | PACK | Freq: Once | INTRAVENOUS | Status: DC | PRN

## 2017-08-10 MED ORDER — TECHNETIUM TC 99M TETROFOSMIN IV KIT
29.8000 | PACK | Freq: Once | INTRAVENOUS | Status: AC | PRN
  Administered 2017-08-10: 29.8 via INTRAVENOUS

## 2017-08-10 NOTE — Telephone Encounter (Signed)
°  New Prob  Requesting to speak to you regarding a question. States the question is personal and did not wish to leave details.

## 2017-08-14 ENCOUNTER — Ambulatory Visit (HOSPITAL_COMMUNITY)
Admission: RE | Admit: 2017-08-14 | Discharge: 2017-08-14 | Disposition: A | Discharge status: Home / Self Care | Payer: Medicare Other | Source: Ambulatory Visit | Attending: Cardiology | Admitting: Cardiology

## 2017-08-14 DIAGNOSIS — I6529 Occlusion and stenosis of unspecified carotid artery: Secondary | ICD-10-CM

## 2017-08-14 DIAGNOSIS — I6523 Occlusion and stenosis of bilateral carotid arteries: Secondary | ICD-10-CM | POA: Diagnosis not present

## 2017-08-21 DIAGNOSIS — Z23 Encounter for immunization: Secondary | ICD-10-CM | POA: Diagnosis not present

## 2017-09-18 ENCOUNTER — Other Ambulatory Visit: Payer: Self-pay | Admitting: Internal Medicine

## 2017-09-18 DIAGNOSIS — Z1231 Encounter for screening mammogram for malignant neoplasm of breast: Secondary | ICD-10-CM

## 2017-10-17 ENCOUNTER — Ambulatory Visit: Payer: Medicare Other

## 2017-10-19 DIAGNOSIS — L600 Ingrowing nail: Secondary | ICD-10-CM | POA: Diagnosis not present

## 2017-11-13 ENCOUNTER — Ambulatory Visit: Payer: Medicare Other | Admitting: Podiatry

## 2017-11-13 DIAGNOSIS — E1142 Type 2 diabetes mellitus with diabetic polyneuropathy: Secondary | ICD-10-CM | POA: Diagnosis not present

## 2017-11-13 DIAGNOSIS — B351 Tinea unguium: Secondary | ICD-10-CM

## 2017-11-13 DIAGNOSIS — L600 Ingrowing nail: Secondary | ICD-10-CM

## 2017-11-13 DIAGNOSIS — M79676 Pain in unspecified toe(s): Secondary | ICD-10-CM | POA: Diagnosis not present

## 2017-11-13 MED ORDER — NEOMYCIN-POLYMYXIN-HC 3.5-10000-1 OT SOLN: OTIC | 0 refills | Status: DC

## 2017-11-13 NOTE — Progress Notes (Signed)
She presents today with a chief complaint of painful ingrown toenail to the hallux right.  Complaining of painful elongated toenails.  Objective: Pulses remain palpable right foot.  No open lesions or wounds.  Sharp incurvated nail margin tibial border of the hallux right.  Toenails are thick yellow dystrophic clinically mycotic and painful palpation.  Assessment: Ingrown nail paronychia abscess hallux right.  Pain in limb secondary onychomycosis diabetes  Plan: Discussed etiology pathology conservative versus surgical therapies.  Performed a chemical matrixectomy today to the tibial border of the hallux right.  Tolerated this well after local anesthesia was administered.  I wrote a prescription for Cortisporin Otic to be applied twice daily after soaking twice daily and Betadine and warm water or Epsom salts and warm water.  She was then covered with a Band-Aid.  Follow-up with me in 1 week.  Debrided toenails 1 through 5 bilateral.

## 2017-11-13 NOTE — Patient Instructions (Addendum)

## 2017-12-04 ENCOUNTER — Ambulatory Visit: Payer: Medicare Other | Admitting: Podiatry

## 2017-12-20 ENCOUNTER — Ambulatory Visit: Payer: Medicare Other | Admitting: Podiatry

## 2017-12-27 DIAGNOSIS — R1030 Lower abdominal pain, unspecified: Secondary | ICD-10-CM | POA: Diagnosis not present

## 2017-12-27 DIAGNOSIS — N302 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: Secondary | ICD-10-CM | POA: Diagnosis not present

## 2017-12-27 DIAGNOSIS — K469 Unspecified abdominal hernia without obstruction or gangrene: Secondary | ICD-10-CM | POA: Diagnosis not present

## 2017-12-27 DIAGNOSIS — K8020 Calculus of gallbladder without cholecystitis without obstruction: Secondary | ICD-10-CM | POA: Diagnosis not present

## 2017-12-27 DIAGNOSIS — N281 Cyst of kidney, acquired: Secondary | ICD-10-CM | POA: Diagnosis not present

## 2018-01-08 ENCOUNTER — Ambulatory Visit: Payer: Medicare Other | Admitting: Physician Assistant

## 2018-01-08 ENCOUNTER — Ambulatory Visit: Payer: Medicare Other | Admitting: Podiatry

## 2018-01-25 ENCOUNTER — Other Ambulatory Visit: Payer: Self-pay

## 2018-01-25 ENCOUNTER — Emergency Department (HOSPITAL_COMMUNITY)
Admission: EM | Admit: 2018-01-25 | Discharge: 2018-01-25 | Disposition: A | Discharge status: Home / Self Care | Payer: Medicare Other | Attending: Emergency Medicine | Admitting: Emergency Medicine

## 2018-01-25 ENCOUNTER — Encounter (HOSPITAL_COMMUNITY): Payer: Self-pay | Admitting: Nurse Practitioner

## 2018-01-25 DIAGNOSIS — R300 Dysuria: Secondary | ICD-10-CM | POA: Diagnosis present

## 2018-01-25 DIAGNOSIS — Z7982 Long term (current) use of aspirin: Secondary | ICD-10-CM | POA: Diagnosis not present

## 2018-01-25 DIAGNOSIS — E119 Type 2 diabetes mellitus without complications: Secondary | ICD-10-CM | POA: Insufficient documentation

## 2018-01-25 DIAGNOSIS — Z79899 Other long term (current) drug therapy: Secondary | ICD-10-CM | POA: Insufficient documentation

## 2018-01-25 DIAGNOSIS — N390 Urinary tract infection, site not specified: Secondary | ICD-10-CM | POA: Diagnosis not present

## 2018-01-25 DIAGNOSIS — I10 Essential (primary) hypertension: Secondary | ICD-10-CM | POA: Insufficient documentation

## 2018-01-25 LAB — URINALYSIS, ROUTINE W REFLEX MICROSCOPIC
Bilirubin Urine: NEGATIVE
Glucose, UA: NEGATIVE mg/dL
Hgb urine dipstick: NEGATIVE
Ketones, ur: NEGATIVE mg/dL
Nitrite: NEGATIVE
Protein, ur: NEGATIVE mg/dL
Specific Gravity, Urine: 1.015 (ref 1.005–1.030)
pH: 5 (ref 5.0–8.0)

## 2018-01-25 MED ORDER — CEPHALEXIN 500 MG PO CAPS: 500.0000 mg | ORAL_CAPSULE | Freq: Four times a day (QID) | ORAL | 0 refills | Status: DC

## 2018-01-25 NOTE — ED Triage Notes (Signed)
Patient in ER for painful urination for 2 weeks without improvement. Patient also was diagnosised a month ago with multiple abdominal  Hernias. Patient had 7 inches of her colon removed and they accidentally cut her ureter and had to have that repaired. Since then she has had problems with infections. Patient has had urgency and frequency with urination. Patient also coughing up moderate amount of white mucus as well.

## 2018-01-25 NOTE — ED Provider Notes (Signed)
[facility] [place] DEPT Provider Note   CSN: [id number] Arrival date & time: 01/25/18  1660     History   Chief Complaint Chief Complaint  Patient presents with  . Painful urination  . Hernia issues    HPI [name] is a 76 y.o. female.  77 year old female presents with dysuria times 2 weeks and worse for the past 2 days.  Denies any flank pain, fever, vomiting.  Has had urinary issues since having surgery several years ago where her ureter was injured.  Denies any abdominal pain.  Has also had chronic throat issues times 8 years which have not involve dysphagia or dyspnea.  Does not take any antibiotics chronically.  Nothing makes his symptoms better.  Denies any vaginal bleeding or discharge.     Past Medical History:  Diagnosis Date  . Abdominal pain   . Arthritis    cervical disc degeneration/ oa left knee, carpal tunnel rt wrist, adhesive capsulitis right shoulder, rt hand weakness; lumbar degeneration  . Colon adenoma   . 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 Constipation   . Diabetes mellitus without complication ([place])    borderline - diet control  . Diverticulitis hx of  . Diverticulosis   . Frequent UTI    hx of urethral injury during colon surgery - states frequent uti's since  . GERD (gastroesophageal reflux disease)   . H/O hiatal hernia   . History of palpitations    in the past  . History of shingles    has a lingering itching on back where shingles were  . Hyperlipidemia   . Hypertension   . Numbness and tingling in right hand    pt. states has numbness of right hand very frequently-watch positioning  . Obesity   . Osteoporosis   . Pain    pain left knee and pain right hip and right groin  . Personal history of colonic polyps-adenoma 08/26/2008  .  Pneumonia 2005    Patient Active Problem List   Diagnosis Date Noted  . Hyperlipidemia 08/23/2014  . Type II or unspecified type diabetes mellitus without mention of complication, uncontrolled 06/19/2014  . Mixed hyperlipidemia 06/19/2014  . S/P urological surgery 09/30/2013  . Joint pain 09/30/2013  . UTI (urinary tract infection) 09/30/2013  . Cough 12/10/2012  . OA (osteoarthritis) of knee 01/05/2012  . Spasm of lumbar paraspinous muscle 06/17/2011  . Morbidly obese ([place]) 05/31/2011  . HIP PAIN, LEFT, CHRONIC 11/09/2010  . CARPAL TUNNEL SYNDROME, RIGHT 10/14/2010  . KNEE PAIN, RIGHT, CHRONIC 10/14/2010  . LEFT BUNDLE BRANCH BLOCK 07/26/2010  . 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 FATIGUE 07/01/2010  . CHEST PAIN, ATYPICAL 07/01/2010  . EPIGASTRIC PAIN 07/01/2010  . SUPRAPUBIC PAIN 07/01/2010  . HYPERGLYCEMIA 07/01/2010  . CELLULITIS AND ABSCESS OF OTHER SPECIFIED SITE 02/15/2010  . LUMP OR MASS IN BREAST 01/10/2010  . DYSURIA, CHRONIC 03/09/2009  . GERD 10/06/2008  . Personal history of colonic polyps-adenoma 08/26/2008  . LOC OSTEOARTHROS NOT SPEC PRIM/SEC LOWER LEG 09/10/2007  . GOUT, UNSPECIFIED 09/09/2007  . DIVERTICULOSIS, COLON 09/09/2007  . DIVERTICULITIS, HX OF 09/09/2007  . HYPERLIPIDEMIA 06/03/2007  . Essential hypertension 06/03/2007    Past Surgical History:  Procedure Laterality Date  . ABDOMINAL HYSTERECTOMY    . APPENDECTOMY    . bladder  tack    . BREAST SURGERY     breast duct resection- benign  . CARPAL TUNNEL RELEASE Right 06/04/2014   Procedure: RIGHT CARPAL TUNNEL RELEASE;  Surgeon: Roseanne Kaufman, MD;  Location: [facility];  Service: Orthopedics;  Laterality: Right;  . COLON RESECTION  2008   hx diverticulosis  . COLON SURGERY    . colonoscopy [id number]    . JOINT REPLACEMENT    . OOPHORECTOMY    . skin graft left arm - traumatic compression injury left upper arm    . temporary ureter stent    . TOTAL KNEE  ARTHROPLASTY  01/05/2012   Procedure: TOTAL KNEE ARTHROPLASTY;  Surgeon: [name], MD;  Location: [facility];  Service: Orthopedics;  Laterality: Right;  . TOTAL KNEE ARTHROPLASTY Left 08/17/2014   Procedure: LEFT TOTAL KNEE ARTHROPLASTY;  Surgeon: [name], MD;  Location: [facility];  Service: Orthopedics;  Laterality: Left;  . ureter repair for tyransected left ureter       OB History   None      Home Medications    Prior to Admission medications   Medication Sig Start Date End Date Taking? Authorizing Provider  amLODipine-benazepril (LOTREL) 5-20 MG per capsule Take 1 capsule by mouth daily. @@ 11 am.    [provider]  aspirin EC 81 MG tablet Take 81 mg by mouth daily.    [provider]  atenolol-chlorthalidone (TENORETIC) 50-25 MG per tablet Take 1 tablet by mouth daily. @@ 11 am 12/09/14   Dutch Quint B, FNP  famotidine (PEPCID) 20 MG tablet Take 20 mg by mouth daily. @ 11 am    [provider]  indomethacin (INDOCIN) 50 MG capsule Take 1 capsule (50 mg total) by mouth 3 (three) times daily with meals. 01/04/16   Carmin Muskrat, MD  lidocaine (LIDODERM) 5 % Place 1 patch onto the skin daily. Remove & Discard patch within 12 hours or as directed by MD 07/13/17   Randal Buba, April, MD  neomycin-polymyxin-hydrocortisone (CORTISPORIN) OTIC solution Apply one to two drops to toe after soaking twice daily. 11/13/17   Hyatt, Max T, DPM  ONETOUCH DELICA LANCETS 63K MISC Use to check blood glucose once daily 06/19/14   Dutch Quint B, FNP  rosuvastatin (CRESTOR) 20 MG tablet Take 1 tablet (20 mg total) by mouth daily. 07/31/17   Skeet Latch, MD    Family History Family History  Problem Relation Age of Onset  . Breast cancer Mother   . Hypertension Mother   . Prostate cancer Father   . Colon cancer Father 17  . Dementia Sister   . Lung cancer Brother   . Cancer Maternal Grandmother   . Arthritis Sister   . Hypertension Sister   . Arthritis Sister    . Hypertension Child   . Hypertension Child   . Hypertension Child     Social History Social History   Tobacco Use  . Smoking status: Never Smoker  . Smokeless tobacco: Never Used  Substance Use Topics  . Alcohol use: No  . Drug use: No     Allergies   Bactrim [sulfamethoxazole-trimethoprim]; Lipitor [atorvastatin calcium]; Other; Oxycodone; and Vicodin [hydrocodone-acetaminophen]   Review of Systems Review of Systems  All other systems reviewed and are negative.    Physical Exam Updated Vital Signs BP (!) 150/68 (BP Location: Right Arm)   Pulse 60   Temp 98.3 F (36.8 C) (Oral)   Resp 18   Ht 1.74 m (5'  8.5")   Wt 115.2 kg (254 lb)   SpO2 99%   BMI 38.06 kg/m   Physical Exam  Constitutional: She is oriented to person, place, and time. She appears well-developed and well-nourished.  Non-toxic appearance. No distress.  HENT:  Head: Normocephalic and atraumatic.  Eyes: Pupils are equal, round, and reactive to light. Conjunctivae, EOM and lids are normal.  Neck: Normal range of motion. Neck supple. No tracheal deviation present. No thyroid mass present.  Cardiovascular: Normal rate, regular rhythm and normal heart sounds. Exam reveals no gallop.  No murmur heard. Pulmonary/Chest: Effort normal and breath sounds normal. No stridor. No respiratory distress. She has no decreased breath sounds. She has no wheezes. She has no rhonchi. She has no rales.  Abdominal: Soft. Normal appearance and bowel sounds are normal. She exhibits no distension. There is no tenderness. There is no rebound and no CVA tenderness.  Musculoskeletal: Normal range of motion. She exhibits no edema or tenderness.  Neurological: She is alert and oriented to person, place, and time. She has normal strength. No cranial nerve deficit or sensory deficit. GCS eye subscore is 4. GCS verbal subscore is 5. GCS motor subscore is 6.  Skin: Skin is warm and dry. No abrasion and no rash noted.  Psychiatric:  She has a normal mood and affect. Her speech is normal and behavior is normal.  Nursing note and vitals reviewed.    ED Treatments / Results  Labs (all labs ordered are listed, but only abnormal results are displayed) Labs Reviewed  URINE CULTURE  URINALYSIS, ROUTINE W REFLEX MICROSCOPIC    EKG None  Radiology No results found.  Procedures Procedures (including critical care time)  Medications Ordered in ED Medications - No data to display   Initial Impression / Assessment and Plan / ED Course  I have reviewed the triage vital signs and the nursing notes.  Pertinent labs & imaging results that were available during my care of the patient were reviewed by me and considered in my medical decision making (see chart for details).     Patient urinalysis positive for infection.  Will place on Keflex and encourage patient follow-up with her GI doctor for her 8-year history of dysphagia  Final Clinical Impressions(s) / ED Diagnoses   Final diagnoses:  None    ED Discharge Orders    None       Lacretia Leigh, MD 01/25/18 1126

## 2018-01-26 LAB — URINE CULTURE: Culture: NO GROWTH

## 2018-02-08 DIAGNOSIS — E1122 Type 2 diabetes mellitus with diabetic chronic kidney disease: Secondary | ICD-10-CM | POA: Diagnosis not present

## 2018-02-08 DIAGNOSIS — R1310 Dysphagia, unspecified: Secondary | ICD-10-CM | POA: Diagnosis not present

## 2018-02-08 DIAGNOSIS — N183 Chronic kidney disease, stage 3 (moderate): Secondary | ICD-10-CM | POA: Diagnosis not present

## 2018-02-08 DIAGNOSIS — I129 Hypertensive chronic kidney disease with stage 1 through stage 4 chronic kidney disease, or unspecified chronic kidney disease: Secondary | ICD-10-CM | POA: Diagnosis not present

## 2018-02-08 DIAGNOSIS — Z1389 Encounter for screening for other disorder: Secondary | ICD-10-CM | POA: Diagnosis not present

## 2018-02-08 DIAGNOSIS — R350 Frequency of micturition: Secondary | ICD-10-CM | POA: Diagnosis not present

## 2018-02-13 ENCOUNTER — Ambulatory Visit: Payer: Medicare Other

## 2018-02-13 DIAGNOSIS — I129 Hypertensive chronic kidney disease with stage 1 through stage 4 chronic kidney disease, or unspecified chronic kidney disease: Secondary | ICD-10-CM | POA: Diagnosis not present

## 2018-02-13 DIAGNOSIS — R350 Frequency of micturition: Secondary | ICD-10-CM | POA: Diagnosis not present

## 2018-02-13 DIAGNOSIS — N183 Chronic kidney disease, stage 3 (moderate): Secondary | ICD-10-CM | POA: Diagnosis not present

## 2018-03-06 ENCOUNTER — Ambulatory Visit: Payer: Medicare Other

## 2018-03-07 DIAGNOSIS — K5730 Diverticulosis of large intestine without perforation or abscess without bleeding: Secondary | ICD-10-CM | POA: Diagnosis not present

## 2018-03-07 DIAGNOSIS — K8020 Calculus of gallbladder without cholecystitis without obstruction: Secondary | ICD-10-CM | POA: Diagnosis not present

## 2018-03-07 DIAGNOSIS — R1031 Right lower quadrant pain: Secondary | ICD-10-CM | POA: Diagnosis not present

## 2018-03-07 DIAGNOSIS — K439 Ventral hernia without obstruction or gangrene: Secondary | ICD-10-CM | POA: Diagnosis not present

## 2018-03-07 DIAGNOSIS — K5900 Constipation, unspecified: Secondary | ICD-10-CM | POA: Diagnosis not present

## 2018-03-11 ENCOUNTER — Ambulatory Visit: Payer: Medicare Other

## 2018-03-12 ENCOUNTER — Ambulatory Visit
Admission: RE | Admit: 2018-03-12 | Discharge: 2018-03-12 | Disposition: A | Discharge status: Home / Self Care | Payer: Medicare Other | Source: Ambulatory Visit | Attending: Internal Medicine | Admitting: Internal Medicine

## 2018-03-12 DIAGNOSIS — Z1231 Encounter for screening mammogram for malignant neoplasm of breast: Secondary | ICD-10-CM | POA: Diagnosis not present

## 2018-03-13 ENCOUNTER — Encounter: Payer: Self-pay | Admitting: Internal Medicine

## 2018-03-13 ENCOUNTER — Ambulatory Visit: Payer: Medicare Other | Admitting: Internal Medicine

## 2018-03-13 VITALS — BP 118/58 | HR 58 | Ht 67.5 in | Wt 260.2 lb

## 2018-03-13 DIAGNOSIS — K439 Ventral hernia without obstruction or gangrene: Secondary | ICD-10-CM | POA: Diagnosis not present

## 2018-03-13 DIAGNOSIS — K581 Irritable bowel syndrome with constipation: Secondary | ICD-10-CM

## 2018-03-13 NOTE — Progress Notes (Signed)
[name] 77 y.o. 05/21/1941 [id number]  Assessment & Plan:   Encounter Diagnoses  Name Primary?  . Irritable bowel syndrome with constipation Yes  . Ventral hernia without obstruction or gangrene    I think her main issue is constipation associated with IBS and if she promotes more regular bowel movements with a high fiber diet possible fiber supplement or even MiraLAX if needed (doubt) she will do okay.  Her hernia is are inconsequential clinically I think at this time.  She was reassured.  Routine repeat colonoscopy 2020.  I appreciate the opportunity to care for this patient. CC: Cheryl Chard, MD Dr. Alona Bene Subjective:   Chief Complaint: hernias, abdominal pain and constipation  HPI Has abdominal pain - somehwat generalized - especially when lying down. Saw Dr. Amalia Hailey' APP - CT scan done 5/9 - essentially negative except 2 incisional/ventral fat containing hernias that have been there (CT 2018). Has been referred to a "hernia specialist also  Says if she moves her bowels well does not have the pain. Prune juice, 1 cup is a good laxative but has been told to cut it with water due to hyperglycemia and DM  Ate pinto beans the other day and had a good BM and felt ok  Lately a little better 4 spontaneous BM's past 7 days, 3 days took prune juice  Worried about "multiple hernias"  Urinary frequency but no leakage of urine or stool  No bleeding  Allergies  Allergen Reactions  . Bee Venom Hives  . Lipitor [Atorvastatin Calcium] Itching  . Oxycodone Other (See Comments)    hallucinations  . Vicodin [Hydrocodone-Acetaminophen] Other (See Comments)    hallucinations   Current Meds  Medication Sig  . allopurinol (ZYLOPRIM) 100 MG tablet Take 100 mg by mouth daily.  Marland Kitchen amLODipine-benazepril (LOTREL) 5-20 MG per capsule Take 1 capsule by mouth daily.   Marland Kitchen aspirin EC 81 MG tablet Take 81 mg by mouth daily.  Marland Kitchen atenolol-chlorthalidone (TENORETIC) 50-25 MG per  tablet Take 1 tablet by mouth daily. @@ 11 am  . cephALEXin (KEFLEX) 500 MG capsule Take 1 capsule (500 mg total) by mouth 4 (four) times daily. (Patient taking differently: Take 500 mg by mouth 4 (four) times daily as needed. )  . famotidine (PEPCID) 20 MG tablet Take 20 mg by mouth daily as needed for heartburn or indigestion.   Marland Kitchen MELATONIN PO Take 2 tablets by mouth at bedtime as needed (For sleep.).  Marland Kitchen ONETOUCH DELICA LANCETS 29J MISC Use to check blood glucose once daily  . Probiotic Product (PROBIOTIC DAILY PO) Take 3 capsules by mouth daily.  . rosuvastatin (CRESTOR) 20 MG tablet Take 1 tablet (20 mg total) by mouth daily.   Past Medical History:  Diagnosis Date  . Abdominal pain   . Arthritis    cervical disc degeneration/ oa left knee, carpal tunnel rt wrist, adhesive capsulitis right shoulder, rt hand weakness; lumbar degeneration  . Carpal tunnel syndrome   . Colon adenoma   . 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 Constipation   . Diabetes mellitus without complication ([place])    borderline - diet control  . Diverticulitis hx of  . Diverticulosis   . Frequent UTI    hx of urethral injury during colon surgery - states frequent uti's since  .  GERD (gastroesophageal reflux disease)   . H/O hiatal hernia   . History of palpitations    in the past  . History of shingles    has a lingering itching on back where shingles were  . Hyperlipidemia   . Hypertension   . Numbness and tingling in right hand    pt. states has numbness of right hand very frequently-watch positioning  . Obesity   . Osteoarthritis   . Osteoporosis   . Pain    pain left knee and pain right hip and right groin  . Personal history of colonic polyps-adenoma 08/26/2008  . Pneumonia 2005  . Vitamin D deficiency    Past Surgical  History:  Procedure Laterality Date  . ABDOMINAL HYSTERECTOMY    . APPENDECTOMY    . bladder tack    . BREAST BIOPSY Left   . BREAST SURGERY     breast duct resection- benign  . CARPAL TUNNEL RELEASE Right 06/04/2014   Procedure: RIGHT CARPAL TUNNEL RELEASE;  Surgeon: Roseanne Kaufman, MD;  Location: [facility];  Service: Orthopedics;  Laterality: Right;  . COLON RESECTION  2008   hx diverticulosis  . COLON SURGERY    . colonoscopy [id number]    . JOINT REPLACEMENT    . OOPHORECTOMY    . skin graft left arm - traumatic compression injury left upper arm    . temporary ureter stent    . TOTAL KNEE ARTHROPLASTY  01/05/2012   Procedure: TOTAL KNEE ARTHROPLASTY;  Surgeon: [name], MD;  Location: [facility];  Service: Orthopedics;  Laterality: Right;  . TOTAL KNEE ARTHROPLASTY Left 08/17/2014   Procedure: LEFT TOTAL KNEE ARTHROPLASTY;  Surgeon: [name], MD;  Location: [facility];  Service: Orthopedics;  Laterality: Left;  . ureter repair for tyransected left ureter     Social History   Social History Narrative   The patient is married and has 6 children   Operates a business   No alcohol tobacco or drug use   family history includes Arthritis in her sister and sister; Breast cancer (age of onset: 65) in her mother; COPD in her brother; Cancer in her maternal grandmother; Colon cancer (age of onset: 49) in her father; Dementia in her sister; Hypertension in her brother, child, child, child, father, mother, and sister; Lung cancer in her brother; Prostate cancer in her father.   Review of Systems See HPI  Objective:   Physical Exam BP (!) 118/58   Pulse (!) 58   Ht 5' 7.5" (1.715 m)   Wt 260 lb 3.2 oz (118 kg)   BMI 40.15 kg/m  Obese bw NAD Abdomen soft and nontender 2 small midline hernias soft and NT  Genella Mech, CMA present   Anoderm inspection revealed no abnormalities Anal wink was + Digital exam revealed normal resting tone and voluntary  squeeze. Nontender, formed brown stool No mass or rectocele present. Simulated defecation with valsalva revealed appropriate abdominal contraction and descent.

## 2018-03-13 NOTE — Patient Instructions (Addendum)
   Try the high fiber diet. If that does not do the trick then can take 1 tablespoon of benefiber daily - or consider prune juice if you go a day without bowel movement) or regular prune juice or MiraLax  Call back or send a My Chart message if ?'s  [place] to get the hernias checked but I do not think they are a problem. They are ventral hernias from your incision.  I appreciate the opportunity to care for you. Gatha Mayer, MD, [facility]

## 2018-03-15 ENCOUNTER — Encounter: Payer: Self-pay | Admitting: Internal Medicine

## 2018-04-12 DIAGNOSIS — N302 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: Secondary | ICD-10-CM | POA: Diagnosis not present

## 2018-04-12 DIAGNOSIS — K469 Unspecified abdominal hernia without obstruction or gangrene: Secondary | ICD-10-CM | POA: Diagnosis not present

## 2018-04-12 DIAGNOSIS — R109 Unspecified abdominal pain: Secondary | ICD-10-CM | POA: Diagnosis not present

## 2018-04-12 DIAGNOSIS — R1030 Lower abdominal pain, unspecified: Secondary | ICD-10-CM | POA: Diagnosis not present

## 2018-04-23 ENCOUNTER — Emergency Department (HOSPITAL_COMMUNITY)
Admission: EM | Admit: 2018-04-23 | Discharge: 2018-04-23 | Disposition: A | Discharge status: Home / Self Care | Payer: Medicare Other | Attending: Emergency Medicine | Admitting: Emergency Medicine

## 2018-04-23 ENCOUNTER — Other Ambulatory Visit: Payer: Self-pay

## 2018-04-23 ENCOUNTER — Encounter (HOSPITAL_COMMUNITY): Payer: Self-pay

## 2018-04-23 ENCOUNTER — Emergency Department (HOSPITAL_COMMUNITY): Payer: Medicare Other

## 2018-04-23 DIAGNOSIS — M545 Low back pain, unspecified: Secondary | ICD-10-CM

## 2018-04-23 DIAGNOSIS — E119 Type 2 diabetes mellitus without complications: Secondary | ICD-10-CM | POA: Insufficient documentation

## 2018-04-23 DIAGNOSIS — Z96653 Presence of artificial knee joint, bilateral: Secondary | ICD-10-CM | POA: Insufficient documentation

## 2018-04-23 DIAGNOSIS — G8929 Other chronic pain: Secondary | ICD-10-CM | POA: Diagnosis not present

## 2018-04-23 DIAGNOSIS — R140 Abdominal distension (gaseous): Secondary | ICD-10-CM | POA: Diagnosis not present

## 2018-04-23 DIAGNOSIS — I10 Essential (primary) hypertension: Secondary | ICD-10-CM | POA: Diagnosis not present

## 2018-04-23 DIAGNOSIS — Z79899 Other long term (current) drug therapy: Secondary | ICD-10-CM | POA: Diagnosis not present

## 2018-04-23 DIAGNOSIS — Z7982 Long term (current) use of aspirin: Secondary | ICD-10-CM | POA: Insufficient documentation

## 2018-04-23 LAB — URINALYSIS, ROUTINE W REFLEX MICROSCOPIC
Bilirubin Urine: NEGATIVE
Glucose, UA: NEGATIVE mg/dL
Hgb urine dipstick: NEGATIVE
Ketones, ur: NEGATIVE mg/dL
Nitrite: NEGATIVE
Protein, ur: NEGATIVE mg/dL
Specific Gravity, Urine: 1.006 (ref 1.005–1.030)
pH: 5 (ref 5.0–8.0)

## 2018-04-23 IMAGING — CR DG LUMBAR SPINE COMPLETE 4+V
5 series · 5 of 5 positions shown · non-contrast
Comparison: [DATE] and prior abdominal and pelvic CTs

CLINICAL DATA: Acute low back pain for 3 days.  Initial encounter.

EXAM:
LUMBAR SPINE - COMPLETE 4+ VIEW

[t lumbar spine ap]
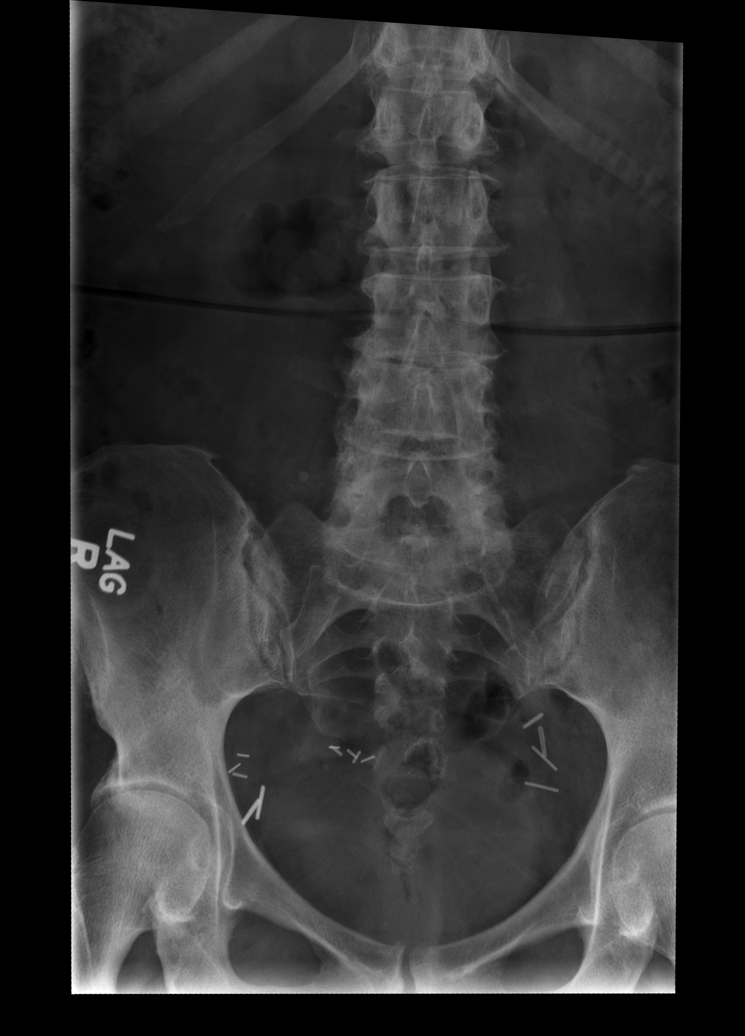

[t lumbar spine obl (1 of 2)]
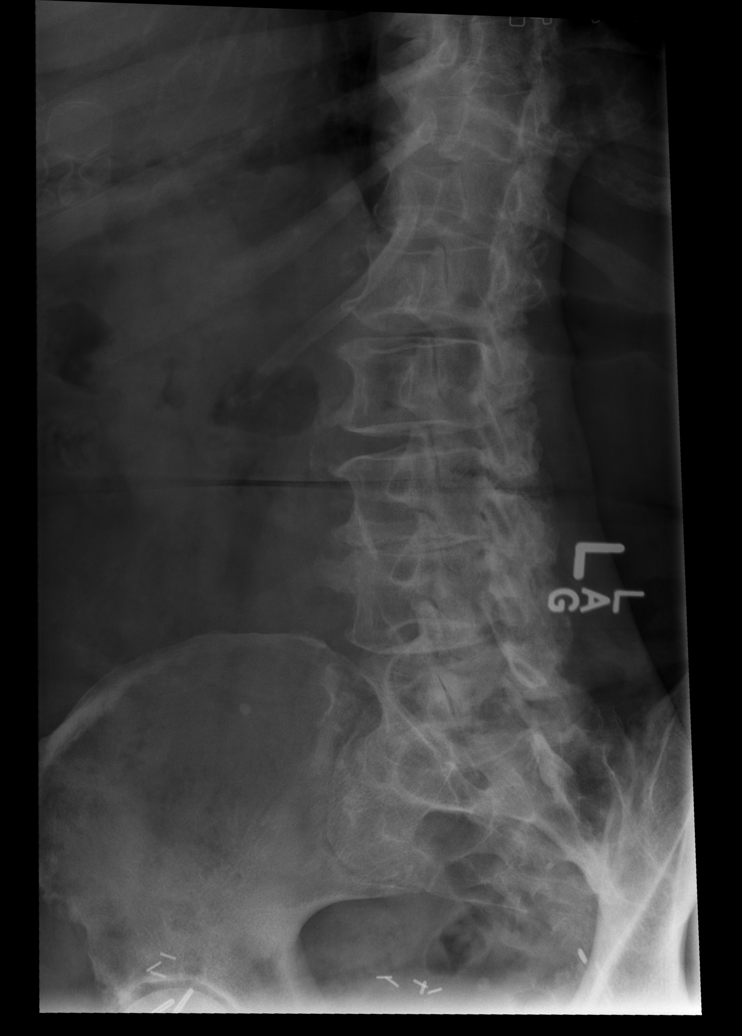

[t lumbar spine obl (2 of 2)]
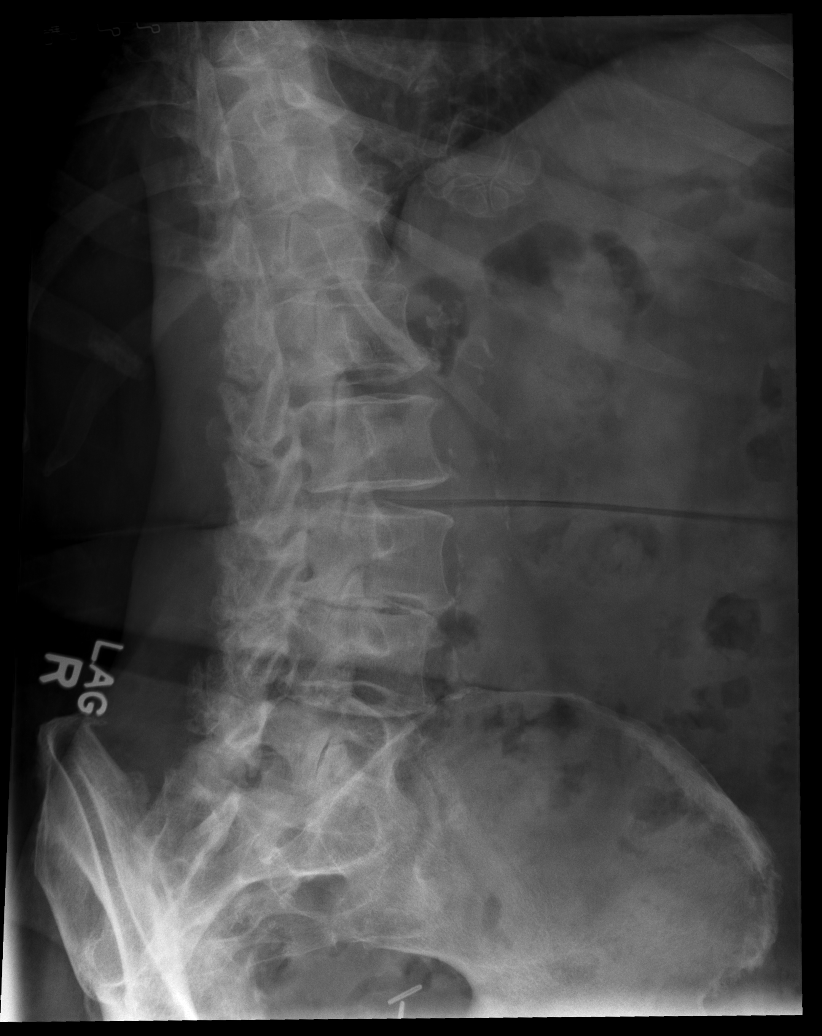

[t lumbar spine lat]
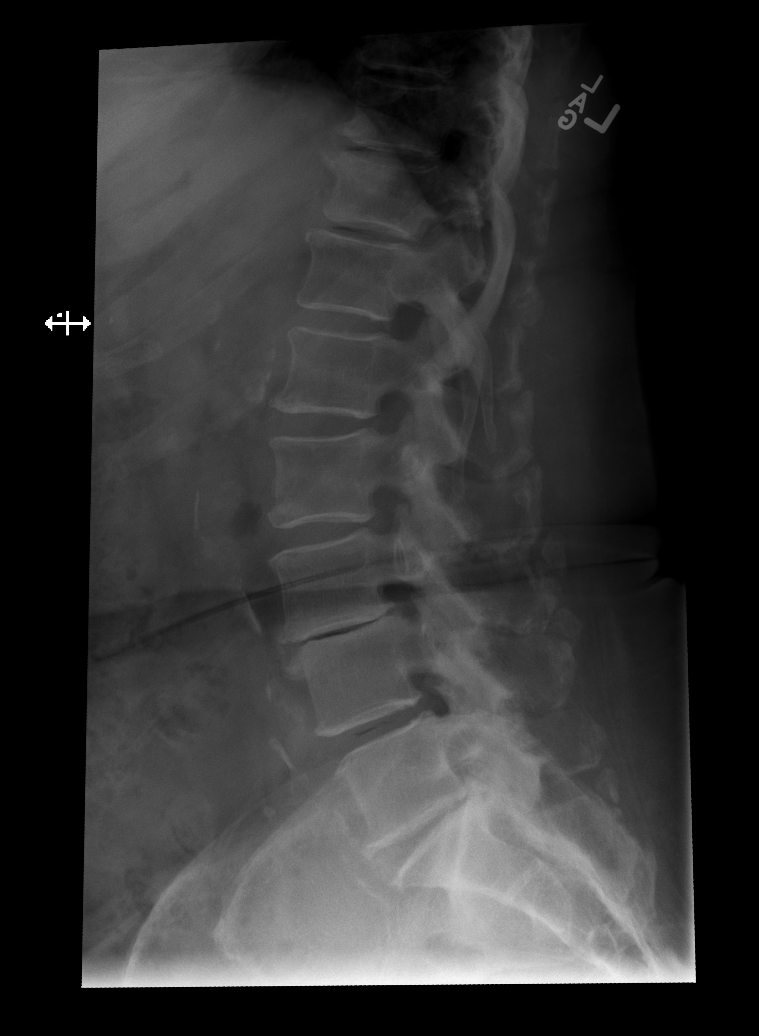

[t lumbar l-5 s-1 spot]
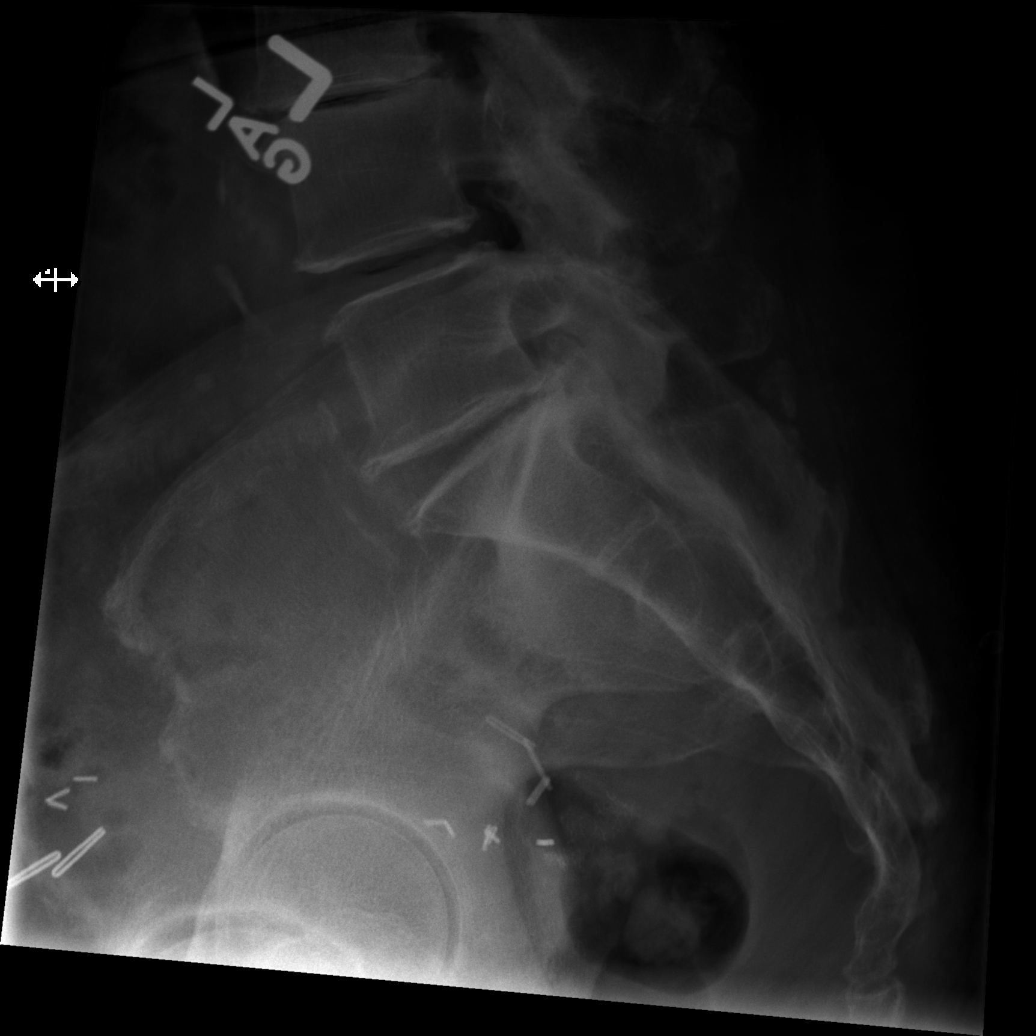

[5 of 5 positions shown; findings below may reference images not displayed]

FINDINGS: No acute fracture or subluxation.

No spondylolysis or focal bony lesion.

Degenerative disc disease/spondylosis within the LOWER lumbar spine
again noted, moderate to severe at L3-4 and moderate from L4-S1.

Moderate facet arthropathy throughout the lumbar spine again noted.

Aortic atherosclerotic calcifications again identified.
IMPRESSION: 1. No acute abnormality
2. Multilevel degenerative changes within the LOWER lumbar spine as
described.

## 2018-04-23 MED ORDER — PREDNISONE 20 MG PO TABS: 40.0000 mg | ORAL_TABLET | Freq: Every day | ORAL | 0 refills | Status: AC

## 2018-04-23 MED ORDER — ACETAMINOPHEN 325 MG PO TABS
650.0000 mg | ORAL_TABLET | Freq: Once | ORAL | Status: AC
  Administered 2018-04-23: 650 mg via ORAL
  Filled 2018-04-23: qty 2

## 2018-04-23 MED ORDER — KETOROLAC TROMETHAMINE 30 MG/ML IJ SOLN
30.0000 mg | Freq: Once | INTRAMUSCULAR | Status: AC
  Administered 2018-04-23: 30 mg via INTRAMUSCULAR
  Filled 2018-04-23: qty 1

## 2018-04-23 MED ORDER — PREDNISONE 20 MG PO TABS
40.0000 mg | ORAL_TABLET | Freq: Once | ORAL | Status: AC
  Administered 2018-04-23: 40 mg via ORAL
  Filled 2018-04-23: qty 2

## 2018-04-23 NOTE — ED Triage Notes (Signed)
Patient c/o right lower back pain x 2 days. Patient also c/o urinary frequency,but denies dysuria. Patient also c/o abdominal bloating x 2 days and progressively worse today.

## 2018-04-23 NOTE — ED Provider Notes (Signed)
[facility] DEPT Provider Note   CSN: [id number] Arrival date & time: 04/23/18  1639     History   Chief Complaint Chief Complaint  Patient presents with  . Urinary Frequency  . Bloated  . Back Pain    HPI [name] is a 77 y.o. female.  HPI   [name] is a 77 year old female with a history of chronic cystitis, lumbar degenerative disc disease, type 2 diabetes, hiatal hernia, hypertension, hyperlipidemia, obesity, osteoporosis who presents to the emergency department for evaluation of back pain.  Patient reports that pain started in her right side, but moved to her right lower back where it has persisted for the past 3 days.  She reports pain "feels like someone is taking a cold knife to my back."  Pain stays in the middle of her back, does not radiate.  The pain is constant, worsened with standing, walking, bending at the hip.  She has not taken any over-the-counter medications for her symptoms.  She reports some relief with heat and laying on a pillow.  She denies recent back injury or heavy lifting.  States that she occasionally has numbness sensation down her right lateral leg.  States that the pain is so bad that it prevents her from walking at times.  She reports urinary incontinence for years, unchanged from baseline.  No bowel incontinence or urinary retention.  States that she also has burning sensation with urination and increased urinary frequency.  States that she feels as if her abdomen is bloated, which is been ongoing for months now and she is concerned about her ventral hernias.  She denies fevers, chills, night sweats, unexpected weight changes, loss of sensation in her feet or toes, weakness, flank pain, abdominal pain, nausea/vomiting, diarrhea, chest pain, shortness of breath, lightheadedness, syncope.  States that she had to get a shot at one point of steroid to help with her back pain.  She is able to ambulate independently despite  pain.  No prior history of cancer or IV drug use.  Past Medical History:  Diagnosis Date  . Abdominal pain   . Arthritis    cervical disc degeneration/ oa left knee, carpal tunnel rt wrist, adhesive capsulitis right shoulder, rt hand weakness; lumbar degeneration  . Carpal tunnel syndrome   . Colon adenoma   . 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 Constipation   . Diabetes mellitus without complication ([place])    borderline - diet control  . Diverticulitis hx of  . Diverticulosis   . Frequent UTI    hx of urethral injury during colon surgery - states frequent uti's since  . GERD (gastroesophageal reflux disease)   . H/O hiatal hernia   . History of palpitations    in the past  . History of shingles    has a lingering itching on back where shingles were  . Hyperlipidemia   . Hypertension   . Numbness and tingling in right hand    pt. states has numbness of right hand very frequently-watch positioning  . Obesity   . Osteoarthritis   . Osteoporosis   . Pain    pain left knee and pain right hip and right groin  . Personal history of colonic polyps-adenoma 08/26/2008  . Pneumonia 2005  . Vitamin D deficiency  Patient Active Problem List   Diagnosis Date Noted  . Hyperlipidemia 08/23/2014  . Type II or unspecified type diabetes mellitus without mention of complication, uncontrolled 06/19/2014  . Mixed hyperlipidemia 06/19/2014  . S/P urological surgery 09/30/2013  . Joint pain 09/30/2013  . UTI (urinary tract infection) 09/30/2013  . Cough 12/10/2012  . OA (osteoarthritis) of knee 01/05/2012  . Spasm of lumbar paraspinous muscle 06/17/2011  . Morbidly obese ([place]) 05/31/2011  . HIP PAIN, LEFT, CHRONIC 11/09/2010  . CARPAL TUNNEL SYNDROME, RIGHT 10/14/2010  . KNEE PAIN, RIGHT, CHRONIC 10/14/2010  .  LEFT BUNDLE BRANCH BLOCK 07/26/2010  . 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 FATIGUE 07/01/2010  . CHEST PAIN, ATYPICAL 07/01/2010  . EPIGASTRIC PAIN 07/01/2010  . SUPRAPUBIC PAIN 07/01/2010  . HYPERGLYCEMIA 07/01/2010  . CELLULITIS AND ABSCESS OF OTHER SPECIFIED SITE 02/15/2010  . LUMP OR MASS IN BREAST 01/10/2010  . DYSURIA, CHRONIC 03/09/2009  . GERD 10/06/2008  . Personal history of colonic polyps-adenoma 08/26/2008  . LOC OSTEOARTHROS NOT SPEC PRIM/SEC LOWER LEG 09/10/2007  . GOUT, UNSPECIFIED 09/09/2007  . DIVERTICULOSIS, COLON 09/09/2007  . DIVERTICULITIS, HX OF 09/09/2007  . HYPERLIPIDEMIA 06/03/2007  . Essential hypertension 06/03/2007    Past Surgical History:  Procedure Laterality Date  . ABDOMINAL HYSTERECTOMY    . APPENDECTOMY    . bladder tack    . BREAST BIOPSY Left   . BREAST SURGERY     breast duct resection- benign  . CARPAL TUNNEL RELEASE Right 06/04/2014   Procedure: RIGHT CARPAL TUNNEL RELEASE;  Surgeon: Roseanne Kaufman, MD;  Location: [facility];  Service: Orthopedics;  Laterality: Right;  . COLON RESECTION  2008   hx diverticulosis  . COLON SURGERY    . colonoscopy [id number]    . JOINT REPLACEMENT    . OOPHORECTOMY    . skin graft left arm - traumatic compression injury left upper arm    . temporary ureter stent    . TOTAL KNEE ARTHROPLASTY  01/05/2012   Procedure: TOTAL KNEE ARTHROPLASTY;  Surgeon: [name], MD;  Location: [facility];  Service: Orthopedics;  Laterality: Right;  . TOTAL KNEE ARTHROPLASTY Left 08/17/2014   Procedure: LEFT TOTAL KNEE ARTHROPLASTY;  Surgeon: [name], MD;  Location: [facility];  Service: Orthopedics;  Laterality: Left;  . ureter repair for tyransected left ureter       OB History   None      Home Medications    Prior to Admission medications   Medication Sig Start Date End Date Taking? Authorizing Provider  allopurinol (ZYLOPRIM) 300 MG tablet Take 300 mg by mouth daily.   Yes  [provider]  amLODipine-benazepril (LOTREL) 5-10 MG capsule Take 1 capsule by mouth daily. 02/07/18  Yes [provider]  aspirin EC 81 MG tablet Take 81 mg by mouth daily.   Yes [provider]  atenolol-chlorthalidone (TENORETIC) 50-25 MG per tablet Take 1 tablet by mouth daily. @@ 11 am 12/09/14  Yes Dutch Quint B, FNP  cephALEXin (KEFLEX) 500 MG capsule Take 1 capsule (500 mg total) by mouth 4 (four) times daily. Patient taking differently: Take 500 mg by mouth 4 (four) times daily as needed (infection).  01/25/18  Yes Lacretia Leigh, MD  famotidine (PEPCID) 20 MG tablet Take 20 mg by mouth daily as needed for heartburn or indigestion.    Yes [provider]  MELATONIN PO Take 2 tablets by mouth at bedtime as needed (For sleep.).   Yes [provider]  [facility] DELICA LANCETS 24O MISC Use to check blood glucose once daily 06/19/14  Yes Dutch Quint B, FNP  rosuvastatin (CRESTOR) 20 MG tablet Take 1 tablet (20 mg total) by mouth daily. 07/31/17  Yes Skeet Latch, MD  Vitamin D, Ergocalciferol, (DRISDOL) 50000 units CAPS capsule Take 1 capsule by mouth 3 (three) times a week. On Monday, Wednesday and Friday 02/17/18  Yes [provider]  Probiotic Product (PROBIOTIC DAILY PO) Take 3 capsules by mouth daily.    [provider]    Family History Family History  Problem Relation Age of Onset  . Breast cancer Mother 27  . Hypertension Mother   . Prostate cancer Father   . Colon cancer Father 39  . Hypertension Father   . Dementia Sister   . Lung cancer Brother   . Hypertension Brother   . COPD Brother   . Cancer Maternal Grandmother   . Arthritis Sister   . Hypertension Sister   . Arthritis Sister   . Hypertension Child   . Hypertension Child   . Hypertension Child     Social History Social History   Tobacco Use  . Smoking status: Never Smoker  . Smokeless tobacco: Never Used  Substance Use Topics  . Alcohol  use: No  . Drug use: No     Allergies   Bee venom; Lipitor [atorvastatin calcium]; Oxycodone; and Vicodin [hydrocodone-acetaminophen]   Review of Systems Review of Systems  Constitutional: Negative for chills and fever.  HENT: Negative for congestion and sore throat.   Respiratory: Negative for shortness of breath.   Cardiovascular: Negative for chest pain.  Gastrointestinal: Positive for abdominal distention (bloated for months). Negative for abdominal pain, blood in stool, nausea and vomiting.  Genitourinary: Positive for difficulty urinating (urinary incontinence for years), dysuria and frequency. Negative for flank pain and hematuria.  Musculoskeletal: Positive for back pain (middle lower back). Negative for gait problem.  Skin: Negative for rash.  Neurological: Positive for numbness (occasional numbness right lateral leg). Negative for weakness.  Psychiatric/Behavioral: Negative for agitation.     Physical Exam Updated Vital Signs BP (!) 155/67 (BP Location: Right Arm)   Pulse 67   Temp 97.9 F (36.6 C) (Oral)   Resp 18   Ht 5' 7.5" (1.715 m)   Wt 113.4 kg (250 lb)   SpO2 99%   BMI 38.58 kg/m   Physical Exam  Constitutional: She is oriented to person, place, and time. She appears well-developed and well-nourished. No distress.  Sitting at bedside in no apparent distress, nontoxic-appearing.  HENT:  Head: Normocephalic and atraumatic.  Mouth/Throat: Oropharynx is clear and moist.  Eyes: Pupils are equal, round, and reactive to light. Conjunctivae are normal. Right eye exhibits no discharge. Left eye exhibits no discharge.  Neck: Normal range of motion. Neck supple.  Cardiovascular: Normal rate, regular rhythm and intact distal pulses.  No murmur heard. Pulmonary/Chest: Effort normal and breath sounds normal. No stridor. No respiratory distress. She has no wheezes. She has no rales.  Abdominal: Soft.  Abdomen soft and nondistended.  Midline well healed abdominal  surgical scar.  No masses or hernia's palpated.  Abdomen is nontender to palpation.  No CVA tenderness.  Musculoskeletal:  No tenderness over the T-spine or L-spine.  No paraspinal muscle tenderness.  Strength 5/5 in bilateral knee flexion/extension and ankle dorsiflexion/plantarflexion.  DP pulses 2+ and symmetric bilaterally.  Neurological: She is alert and oriented to person, place, and time. Coordination normal.  Sensation to  light touch intact in bilateral lower extremities.  Gait normal and coordination and balance.  Skin: Skin is warm and dry. Capillary refill takes less than 2 seconds. She is not diaphoretic.  Psychiatric: She has a normal mood and affect. Her behavior is normal.  Nursing note and vitals reviewed.    ED Treatments / Results  Labs (all labs ordered are listed, but only abnormal results are displayed) Labs Reviewed  URINALYSIS, ROUTINE W REFLEX MICROSCOPIC - Abnormal; Notable for the following components:      Result Value   Color, Urine STRAW (*)    Leukocytes, UA TRACE (*)    Bacteria, UA RARE (*)    All other components within normal limits  CBG MONITORING, ED    EKG None  Radiology Dg Lumbar Spine Complete  Result Date: 04/23/2018 CLINICAL DATA:  Acute low back pain for 3 days.  Initial encounter. EXAM: LUMBAR SPINE - COMPLETE 4+ VIEW COMPARISON:  03/07/2018 and prior abdominal and pelvic CTs FINDINGS: No acute fracture or subluxation. No spondylolysis or focal bony lesion. Degenerative disc disease/spondylosis within the LOWER lumbar spine again noted, moderate to severe at L3-4 and moderate from L4-S1. Moderate facet arthropathy throughout the lumbar spine again noted. Aortic atherosclerotic calcifications again identified. IMPRESSION: 1. No acute abnormality 2. Multilevel degenerative changes within the LOWER lumbar spine as described. Electronically Signed   By: Margarette Canada M.D.   On: 04/23/2018 21:18    Procedures Procedures (including critical care  time)  Medications Ordered in ED Medications  predniSONE (DELTASONE) tablet 40 mg (has no administration in time range)  ketorolac (TORADOL) 30 MG/ML injection 30 mg (30 mg Intramuscular Given 04/23/18 2208)  acetaminophen (TYLENOL) tablet 650 mg (650 mg Oral Given 04/23/18 2208)     Initial Impression / Assessment and Plan / ED Course  I have reviewed the triage vital signs and the nursing notes.  Pertinent labs & imaging results that were available during my care of the patient were reviewed by me and considered in my medical decision making (see chart for details).     Patient with a history of chronic cystitis and lumbar degenerative disc disease presents to the emergency department for evaluation of lower back pain, dysuria and urinary frequency.  Her pain is worse with movement and she endorses occasional numbness down her right leg.  Denies recent injury or strenuous activity.  She had a recent CT scan of her abdomen/pelvis 02/2018 which showed two small supraumbilical hernias. No aortic aneurysm.   On exam she is afebrile and nontoxic-appearing.  She is neurovascularly intact in bilateral lower extremities.  No tenderness to palpation over her back.  No CVA tenderness.  Abdomen soft and nontender. She is able to ambulate independently.   Urine without evidence of infection, no concern for cystitis or pyelonephritis. Her symptoms are likely MSK back pain given worsened with movement and history of lumbar degenerative disk disease. Full strength in bilateral LE, no saddle anesthesia and no concern for cauda equina. Do not suspect aortic aneurysm given presentation and recent CT scan without evidence of aortic aneurysm. No blood in urine or colicky symptoms to suggest nephrolithiasis. Do not think further abdominal imaging is indicated at this time given no abdominal tenderness and recent CT scan of abdomen/pelvis 02/2018 reassuring.  Xray lumbar spine reveals degenerative changes, no acute  fracture or abnormality. On recheck patient reports improvement in her pain. Plan to discharge home with RICE protocol, steroid burst and tylenol.  Her blood pressure was  elevated in the ER today, counseled her to have this rechecked by her primary care doctor.  Counseled her on reasons to return to the ED and she agrees and appears reliable for follow up.  Discussed this patient with Dr. Regenia Skeeter who also saw the patient and agrees with plan and discharge home.  Final Clinical Impressions(s) / ED Diagnoses   Final diagnoses:  Acute exacerbation of chronic low back pain    ED Discharge Orders        Ordered    predniSONE (DELTASONE) 20 MG tablet  Daily     04/23/18 2221       Glyn Ade, PA-C 04/23/18 2225    Sherwood Gambler, MD 04/23/18 2240

## 2018-04-23 NOTE — Discharge Instructions (Addendum)
The x-ray of your lower back shows arthritis.  Your urine is not infected.  Please take prednisone for the next 4 days.  You got your first dose in the ER tonight.  He can also take Tylenol 650 mg every 6 hours for pain.  Apply heat to the lower back to help with your symptoms.  Your blood pressure was elevated in the ER today, please have this rechecked by your regular doctor.  Return to the ER if you have any new or concerning symptoms like fever, numbness in which she cannot feel your feet or legs, loss of bowel control, abdominal pain with vomiting.

## 2018-05-20 DIAGNOSIS — K432 Incisional hernia without obstruction or gangrene: Secondary | ICD-10-CM | POA: Diagnosis not present

## 2018-05-20 DIAGNOSIS — R1031 Right lower quadrant pain: Secondary | ICD-10-CM | POA: Diagnosis not present

## 2018-05-20 DIAGNOSIS — R109 Unspecified abdominal pain: Secondary | ICD-10-CM | POA: Diagnosis not present

## 2018-06-19 DIAGNOSIS — N183 Chronic kidney disease, stage 3 (moderate): Secondary | ICD-10-CM | POA: Diagnosis not present

## 2018-06-19 DIAGNOSIS — I129 Hypertensive chronic kidney disease with stage 1 through stage 4 chronic kidney disease, or unspecified chronic kidney disease: Secondary | ICD-10-CM | POA: Diagnosis not present

## 2018-06-19 DIAGNOSIS — N08 Glomerular disorders in diseases classified elsewhere: Secondary | ICD-10-CM | POA: Diagnosis not present

## 2018-06-19 DIAGNOSIS — E1122 Type 2 diabetes mellitus with diabetic chronic kidney disease: Secondary | ICD-10-CM | POA: Diagnosis not present

## 2018-06-19 LAB — HEPATIC FUNCTION PANEL
ALT: 28 (ref 7–35)
AST: 33 (ref 13–35)
Alkaline Phosphatase: 52 (ref 25–125)
Bilirubin, Total: 0.8

## 2018-06-19 LAB — BASIC METABOLIC PANEL
BUN: 20 (ref 4–21)
Creatinine: 1.1 (ref 0.5–1.1)
Glucose: 165
Potassium: 4.2 (ref 3.4–5.3)
Sodium: 139 (ref 137–147)

## 2018-06-19 LAB — HEMOGLOBIN A1C: Hgb A1c MFr Bld: 8.9 — AB (ref 4.0–6.0)

## 2018-06-26 DIAGNOSIS — E1122 Type 2 diabetes mellitus with diabetic chronic kidney disease: Secondary | ICD-10-CM | POA: Diagnosis not present

## 2018-06-26 DIAGNOSIS — K5900 Constipation, unspecified: Secondary | ICD-10-CM | POA: Diagnosis not present

## 2018-06-26 DIAGNOSIS — N183 Chronic kidney disease, stage 3 (moderate): Secondary | ICD-10-CM | POA: Diagnosis not present

## 2018-07-17 ENCOUNTER — Encounter: Payer: Self-pay | Admitting: Internal Medicine

## 2018-07-17 DIAGNOSIS — K5900 Constipation, unspecified: Secondary | ICD-10-CM

## 2018-07-30 ENCOUNTER — Ambulatory Visit: Payer: Medicare Other | Admitting: Internal Medicine

## 2018-07-30 DIAGNOSIS — Z961 Presence of intraocular lens: Secondary | ICD-10-CM | POA: Diagnosis not present

## 2018-07-30 DIAGNOSIS — H25812 Combined forms of age-related cataract, left eye: Secondary | ICD-10-CM | POA: Diagnosis not present

## 2018-07-30 DIAGNOSIS — E119 Type 2 diabetes mellitus without complications: Secondary | ICD-10-CM | POA: Diagnosis not present

## 2018-08-25 ENCOUNTER — Other Ambulatory Visit: Payer: Self-pay | Admitting: Internal Medicine

## 2018-08-26 ENCOUNTER — Ambulatory Visit (INDEPENDENT_AMBULATORY_CARE_PROVIDER_SITE_OTHER): Payer: Medicare Other | Admitting: Internal Medicine

## 2018-08-26 ENCOUNTER — Encounter: Payer: Self-pay | Admitting: Internal Medicine

## 2018-08-26 VITALS — BP 124/68 | HR 64 | Temp 98.4°F | Ht 65.0 in | Wt 248.8 lb

## 2018-08-26 DIAGNOSIS — K5900 Constipation, unspecified: Secondary | ICD-10-CM | POA: Diagnosis not present

## 2018-08-26 DIAGNOSIS — N183 Chronic kidney disease, stage 3 unspecified: Secondary | ICD-10-CM

## 2018-08-26 DIAGNOSIS — K219 Gastro-esophageal reflux disease without esophagitis: Secondary | ICD-10-CM

## 2018-08-26 DIAGNOSIS — E1122 Type 2 diabetes mellitus with diabetic chronic kidney disease: Secondary | ICD-10-CM | POA: Diagnosis not present

## 2018-08-26 DIAGNOSIS — R350 Frequency of micturition: Secondary | ICD-10-CM

## 2018-08-26 DIAGNOSIS — I129 Hypertensive chronic kidney disease with stage 1 through stage 4 chronic kidney disease, or unspecified chronic kidney disease: Secondary | ICD-10-CM

## 2018-08-26 LAB — POCT URINALYSIS DIPSTICK
Bilirubin, UA: NEGATIVE
Blood, UA: NEGATIVE
Glucose, UA: NEGATIVE
Ketones, UA: NEGATIVE
Nitrite, UA: NEGATIVE
Protein, UA: NEGATIVE
Spec Grav, UA: 1.015 (ref 1.010–1.025)
Urobilinogen, UA: 1 E.U./dL
pH, UA: 7 (ref 5.0–8.0)

## 2018-08-26 NOTE — Patient Instructions (Signed)

## 2018-08-26 NOTE — Progress Notes (Addendum)
Subjective:     Patient ID: [name] , female    DOB: 1941-08-16 , 77 y.o.   MRN: [id number]   Chief Complaint  Patient presents with  . Diabetes  . Abdominal Pain  . Hypertension    HPI  Diabetes  She presents for her follow-up diabetic visit. She has type 2 diabetes mellitus. Her disease course has been stable. There are no hypoglycemic associated symptoms. There are no hypoglycemic complications.  Abdominal Pain  Associated symptoms include constipation (last BM - 2 days ago. Has h/o constipation. Has had Linzess, but she read it could kill her so she stopped it. She tried veggie juice, no relief. ) and frequency.  Hypertension  This is a chronic problem. The current episode started more than 1 year ago. The problem is unchanged. The problem is controlled. Risk factors for coronary artery disease include diabetes mellitus, dyslipidemia, obesity and sedentary lifestyle.   REPORTS COMPLIANCE WITH MEDS.   Past Medical History:  Diagnosis Date  . Abdominal pain   . Arthritis    cervical disc degeneration/ oa left knee, carpal tunnel rt wrist, adhesive capsulitis right shoulder, rt hand weakness; lumbar degeneration  . Carpal tunnel syndrome   . Colon adenoma   . 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 Constipation   . Diabetes mellitus without complication ([place])    borderline - diet control  . Diverticulitis hx of  . Diverticulosis   . Frequent UTI    hx of urethral injury during colon surgery - states frequent uti's since  . GERD (gastroesophageal reflux disease)   . H/O hiatal hernia   . History of palpitations    in the past  . History of shingles    has a lingering itching on back where shingles were  . Hyperlipidemia   . Hypertension   . Numbness and tingling in right hand    pt. states has  numbness of right hand very frequently-watch positioning  . Obesity   . Osteoarthritis   . Osteoporosis   . Pain    pain left knee and pain right hip and right groin  . Personal history of colonic polyps-adenoma 08/26/2008  . Pneumonia 2005  . Vitamin D deficiency       Current Outpatient Medications:  .  allopurinol (ZYLOPRIM) 300 MG tablet, Take 300 mg by mouth daily., Disp: , Rfl:  .  aspirin EC 81 MG tablet, Take 81 mg by mouth daily., Disp: , Rfl:  .  atenolol-chlorthalidone (TENORETIC) 50-25 MG per tablet, Take 1 tablet by mouth daily. @@ 11 am, Disp: 30 tablet, Rfl: 0 .  famotidine (PEPCID) 20 MG tablet, Take 20 mg by mouth daily as needed for heartburn or indigestion. , Disp: , Rfl:  .  magnesium oxide (MAG-OX) 400 MG tablet, Take 400 mg by mouth daily. 1 tablet daily, Disp: , Rfl:  .  MELATONIN PO, Take 2 tablets by mouth at bedtime as needed (For sleep.)., Disp: , Rfl:  .  Multiple Vitamin (MULTIVITAMIN) capsule, Take 1 capsule by mouth daily., Disp: , Rfl:  .  Multiple Vitamins-Minerals (IMMUNE SUPPORT PO), Take by mouth., Disp: , Rfl:  .  ONETOUCH DELICA LANCETS 13K MISC, Use to check blood glucose once daily, Disp: 100 each, Rfl: 3 .  Probiotic Product (PROBIOTIC DAILY PO), Take 3  capsules by mouth daily., Disp: , Rfl:  .  rosuvastatin (CRESTOR) 20 MG tablet, Take 1 tablet (20 mg total) by mouth daily., Disp: 90 tablet, Rfl: 3 .  amLODipine-benazepril (LOTREL) 5-10 MG capsule, Take 1 capsule by mouth daily., Disp: 90 capsule, Rfl: 1 .  hydrocortisone cream 1 %, Apply to affected area 2 times daily, Disp: 30 g, Rfl: 1 .  TRULICITY 2.56 LS/9.3TD SOPN, INJECT 0.5 ML(0.75 MG) SUBCUTANEOUSLY EVERY WEEK IN ABDOMEN, THIGH OR UPPER ARM ROTATING INJECTION SITE, Disp: 2 mL, Rfl: 0   Allergies  Allergen Reactions  . Atorvastatin Calcium Itching    Other reaction(s): Itching  . Bee Venom Hives  . Lipitor [Atorvastatin Calcium] Itching  . Oxycodone Other (See Comments)     hallucinations  . Vicodin [Hydrocodone-Acetaminophen] Other (See Comments)    hallucinations     Review of Systems  Constitutional: Negative.   HENT: Negative.   Respiratory: Negative.   Cardiovascular: Negative.   Gastrointestinal: Positive for abdominal pain and constipation (last BM - 2 days ago. Has h/o constipation. Has had Linzess, but she read it could kill her so she stopped it. She tried veggie juice, no relief. ).  Genitourinary: Positive for frequency.  Neurological: Negative.   Psychiatric/Behavioral: Negative.      Today's Vitals   08/26/18 1140  BP: 124/68  Pulse: 64  Temp: 98.4 F (36.9 C)  TempSrc: Oral  Weight: 248 lb 12.8 oz (112.9 kg)  Height: 5\' 5"  (1.651 m)  PainSc: 10-Worst pain ever  PainLoc: Abdomen   Body mass index is 41.4 kg/m.   Objective:  Physical Exam  Constitutional: She appears well-developed and well-nourished.  HENT:  Head: Normocephalic and atraumatic.  Eyes: EOM are normal.  Abdominal: Normal appearance and bowel sounds are normal. There is tenderness in the epigastric area.  Skin: Skin is warm and dry.  Nursing note and vitals reviewed.       Assessment And Plan:     1. Type 2 diabetes mellitus with stage 3 chronic kidney disease, without long-term current use of insulin (HCC)  I WILL CHECK CMET, HBA1C TODAY. SHE WILL CONTINUE WITH TRULICITY 1.5MG  ONCE WEEKLY. AGAIN,IMPORTANCE OF REGULAR EXERCISE WAS DISCUSSED WITH THE PATIENT.   2. Chronic renal disease, stage III (HCC)  CHRONIC. I WILL CHECK A GFR, CR TODAY.   3. Hypertensive nephropathy  WELL CONTROLLED. SHE WILL CONTINUE WITH CURRENT MEDS. SHE IS ENCOURAGED TO AVOID ADDING SALT TO HER FOODS.   4. Constipation, unspecified constipation type  I THINK THIS IS THE CAUSE OF HER ABDOMINAL PAIN. SHE IS ADVISED TO USE DULCOLAX SUPPOSITORY TONIGHT.  SHE IS ENCOURAGED TO TAKE LINZESS AS PRESCRIBED. SHE WILL ALSO RESUME USE OF PROBIOTIC.   5. Morbidly obese ([name])  SHE IS  ENCOURAGED TO EXERCISE NO LESS THAN 30 MINUTES FIVE DAYS WEEKLY.   6. Gastroesophageal reflux disease without esophagitis  SHE WAS GIVEN SAMPLES OF NEXIUM OTC TO TAKE ONCE DAILY INSTEAD OF HER PEPCID. SHE WILL CALL ME NEXT WEEK TO LET ME KNOW HOW SHE IS FEELING. SHE IS ENCOURAGED TO NOTIFY ME IF SHE DEVELOPS WORSENING ABDOMINAL PAIN AND/OR N/V.   7. Urinary frequency  U/A PERFORMED TO CHECK FOR UTI.   - POCT Urinalysis Dipstick (42876)  Maximino Greenland, MD

## 2018-08-27 ENCOUNTER — Other Ambulatory Visit: Payer: Self-pay

## 2018-08-27 ENCOUNTER — Encounter: Payer: Self-pay | Admitting: Internal Medicine

## 2018-08-27 LAB — CMP14+EGFR
ALT: 26 IU/L (ref 0–32)
AST: 34 IU/L (ref 0–40)
Albumin/Globulin Ratio: 1.8 (ref 1.2–2.2)
Albumin: 4.2 g/dL (ref 3.5–4.8)
Alkaline Phosphatase: 47 IU/L (ref 39–117)
BUN/Creatinine Ratio: 15 (ref 12–28)
BUN: 16 mg/dL (ref 8–27)
Bilirubin Total: 0.9 mg/dL (ref 0.0–1.2)
CO2: 26 mmol/L (ref 20–29)
Calcium: 9.5 mg/dL (ref 8.7–10.3)
Chloride: 101 mmol/L (ref 96–106)
Creatinine, Ser: 1.04 mg/dL — ABNORMAL HIGH (ref 0.57–1.00)
GFR calc Af Amer: 60 mL/min/{1.73_m2} (ref >59)
GFR calc non Af Amer: 52 mL/min/{1.73_m2} — ABNORMAL LOW (ref >59)
Globulin, Total: 2.4 g/dL (ref 1.5–4.5)
Glucose: 114 mg/dL — ABNORMAL HIGH (ref 65–99)
Potassium: 3.8 mmol/L (ref 3.5–5.2)
Sodium: 143 mmol/L (ref 134–144)
Total Protein: 6.6 g/dL (ref 6.0–8.5)

## 2018-08-27 LAB — LIPID PANEL
Chol/HDL Ratio: 4.4 ratio (ref 0.0–4.4)
Cholesterol, Total: 172 mg/dL (ref 100–199)
HDL: 39 mg/dL — ABNORMAL LOW (ref >39)
LDL Calculated: 106 mg/dL — ABNORMAL HIGH (ref 0–99)
Triglycerides: 135 mg/dL (ref 0–149)
VLDL Cholesterol Cal: 27 mg/dL (ref 5–40)

## 2018-08-27 LAB — HEMOGLOBIN A1C
Est. average glucose Bld gHb Est-mCnc: 154 mg/dL
Hgb A1c MFr Bld: 7 % — ABNORMAL HIGH (ref 4.8–5.6)

## 2018-08-27 MED ORDER — AMLODIPINE BESY-BENAZEPRIL HCL 5-10 MG PO CAPS: 1.0000 | ORAL_CAPSULE | Freq: Every day | ORAL | 1 refills | Status: DC

## 2018-08-31 ENCOUNTER — Emergency Department (HOSPITAL_COMMUNITY)
Admission: EM | Admit: 2018-08-31 | Discharge: 2018-09-01 | Disposition: A | Discharge status: Home / Self Care | Payer: Medicare Other | Attending: Emergency Medicine | Admitting: Emergency Medicine

## 2018-08-31 ENCOUNTER — Emergency Department (HOSPITAL_COMMUNITY): Payer: Medicare Other

## 2018-08-31 ENCOUNTER — Encounter (HOSPITAL_COMMUNITY): Payer: Self-pay | Admitting: *Deleted

## 2018-08-31 DIAGNOSIS — R1032 Left lower quadrant pain: Secondary | ICD-10-CM | POA: Insufficient documentation

## 2018-08-31 DIAGNOSIS — R1012 Left upper quadrant pain: Secondary | ICD-10-CM | POA: Diagnosis not present

## 2018-08-31 DIAGNOSIS — E119 Type 2 diabetes mellitus without complications: Secondary | ICD-10-CM | POA: Diagnosis not present

## 2018-08-31 DIAGNOSIS — R197 Diarrhea, unspecified: Secondary | ICD-10-CM

## 2018-08-31 DIAGNOSIS — R109 Unspecified abdominal pain: Secondary | ICD-10-CM | POA: Diagnosis not present

## 2018-08-31 DIAGNOSIS — Z7984 Long term (current) use of oral hypoglycemic drugs: Secondary | ICD-10-CM | POA: Insufficient documentation

## 2018-08-31 DIAGNOSIS — K5730 Diverticulosis of large intestine without perforation or abscess without bleeding: Secondary | ICD-10-CM | POA: Diagnosis not present

## 2018-08-31 DIAGNOSIS — A09 Infectious gastroenteritis and colitis, unspecified: Secondary | ICD-10-CM | POA: Diagnosis not present

## 2018-08-31 DIAGNOSIS — Z79899 Other long term (current) drug therapy: Secondary | ICD-10-CM | POA: Diagnosis not present

## 2018-08-31 LAB — CBC
HCT: 45 % (ref 36.0–46.0)
Hemoglobin: 14.5 g/dL (ref 12.0–15.0)
MCH: 29.5 pg (ref 26.0–34.0)
MCHC: 32.2 g/dL (ref 30.0–36.0)
MCV: 91.6 fL (ref 80.0–100.0)
Platelets: 240 10*3/uL (ref 150–400)
RBC: 4.91 MIL/uL (ref 3.87–5.11)
RDW: 13.9 % (ref 11.5–15.5)
WBC: 13.2 10*3/uL — ABNORMAL HIGH (ref 4.0–10.5)
nRBC: 0 % (ref 0.0–0.2)

## 2018-08-31 LAB — COMPREHENSIVE METABOLIC PANEL
ALT: 29 U/L (ref 0–44)
AST: 34 U/L (ref 15–41)
Albumin: 3.9 g/dL (ref 3.5–5.0)
Alkaline Phosphatase: 42 U/L (ref 38–126)
Anion gap: 11 (ref 5–15)
BUN: 21 mg/dL (ref 8–23)
CO2: 23 mmol/L (ref 22–32)
Calcium: 9.3 mg/dL (ref 8.9–10.3)
Chloride: 103 mmol/L (ref 98–111)
Creatinine, Ser: 0.98 mg/dL (ref 0.44–1.00)
GFR calc Af Amer: >60 mL/min (ref >60)
GFR calc non Af Amer: 55 mL/min — ABNORMAL LOW (ref >60)
Glucose, Bld: 110 mg/dL — ABNORMAL HIGH (ref 70–99)
Potassium: 3.6 mmol/L (ref 3.5–5.1)
Sodium: 137 mmol/L (ref 135–145)
Total Bilirubin: 1.1 mg/dL (ref 0.3–1.2)
Total Protein: 7.1 g/dL (ref 6.5–8.1)

## 2018-08-31 LAB — LIPASE, BLOOD: Lipase: 26 U/L (ref 11–51)

## 2018-08-31 IMAGING — CT CT ABD-PELV W/ CM
2 of 5 series · 16 of 46 positions shown, 18 images · IV contrast (ISOVUE)
Comparison: CT of the abdomen pelvis dated [DATE]

CLINICAL DATA: 77-year-old female with lower abdominal pain and
diarrhea.

EXAM:
CT ABDOMEN AND PELVIS WITH CONTRAST
TECHNIQUE: Multidetector CT imaging of the abdomen and pelvis was performed
using the standard protocol following bolus administration of
intravenous contrast.
CONTRAST:  100mL [O5] IOPAMIDOL ([O5]) INJECTION 61%

[Series 2: axial st · axial · 0.83mm/px · z∈[-416,-26]mm · 13 of 90 slices shown, 15 images]
[im 6/90  soft-tissue]
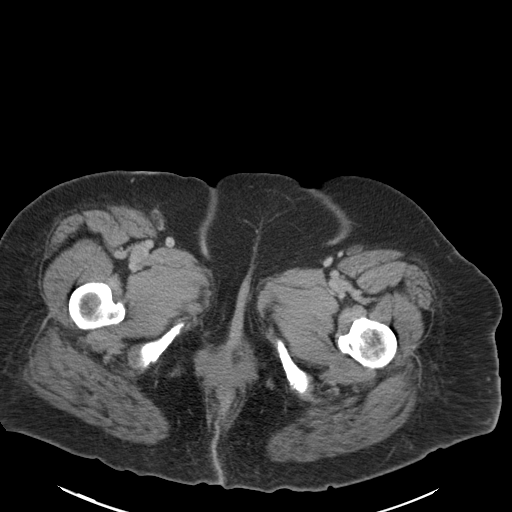
[im 6/90  bone]
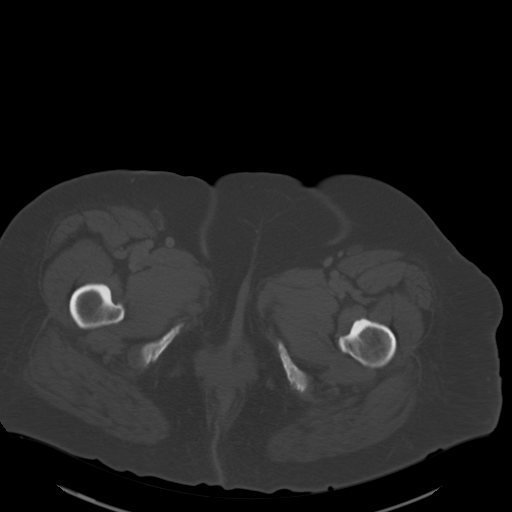
[im 12/90  soft-tissue]
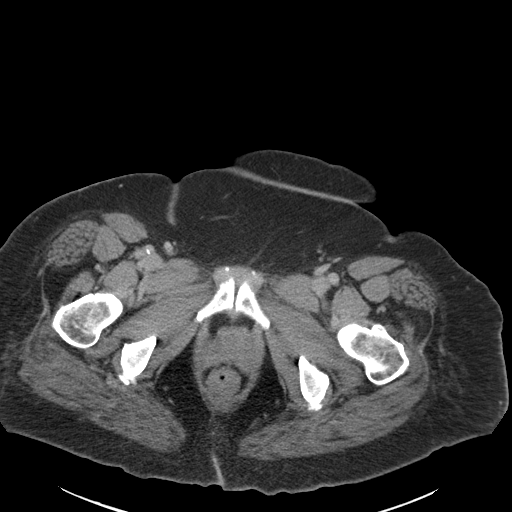
[im 18/90  soft-tissue]
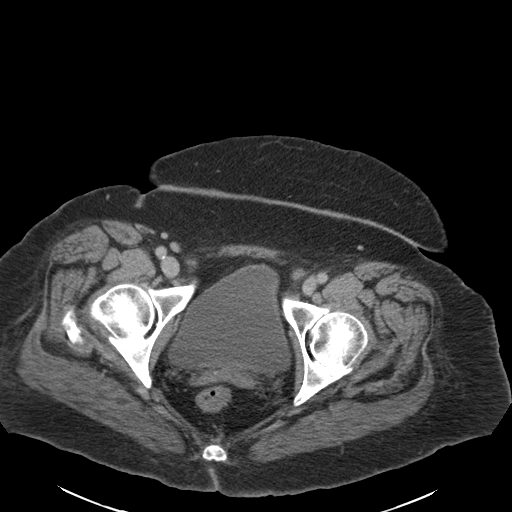
[im 24/90  soft-tissue]
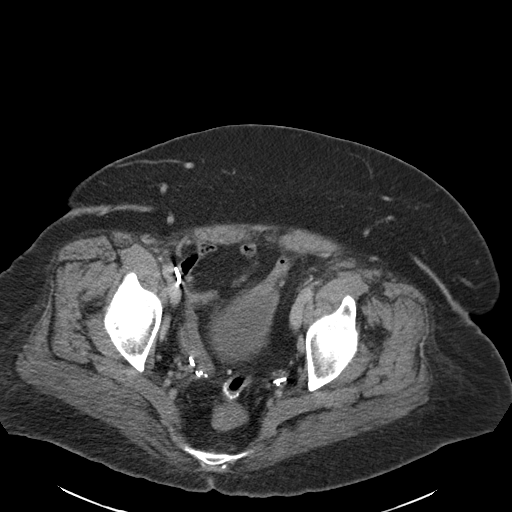
[im 30/90  soft-tissue]
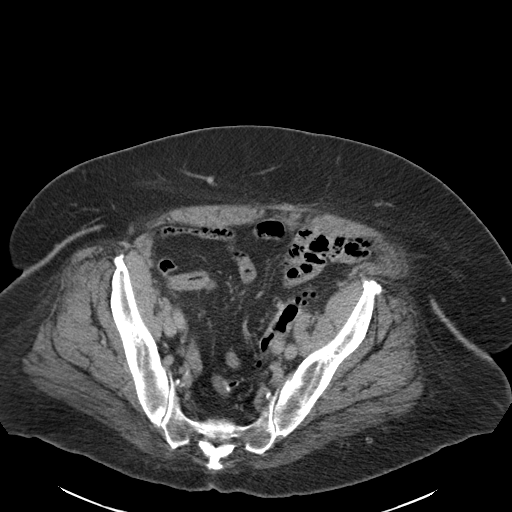
[im 36/90  soft-tissue]
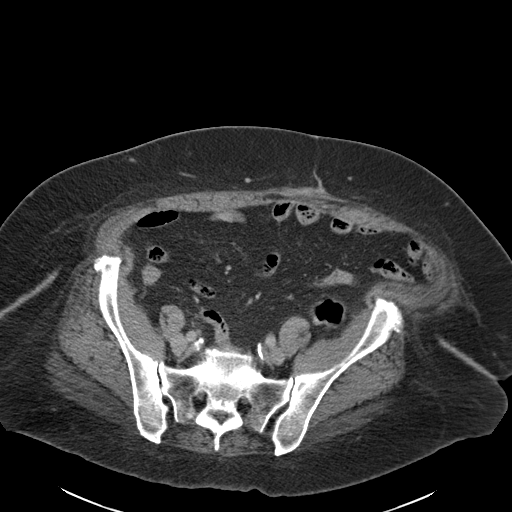
[im 48/90  soft-tissue]
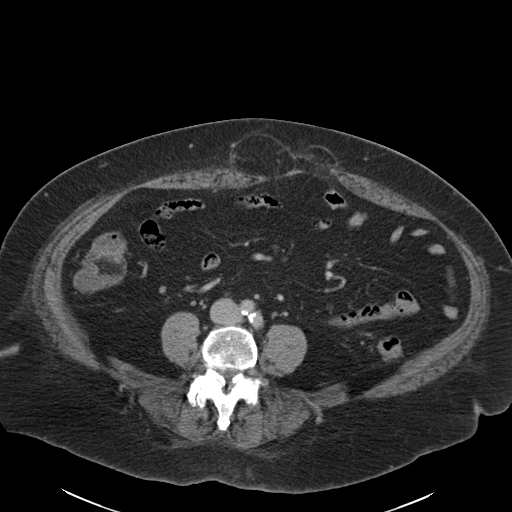
[im 54/90  soft-tissue]
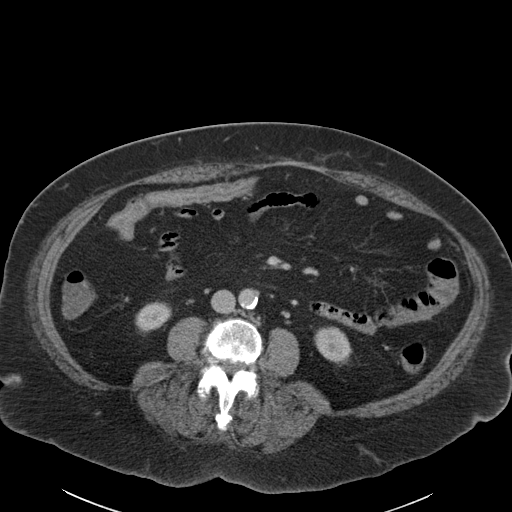
[im 60/90  soft-tissue]
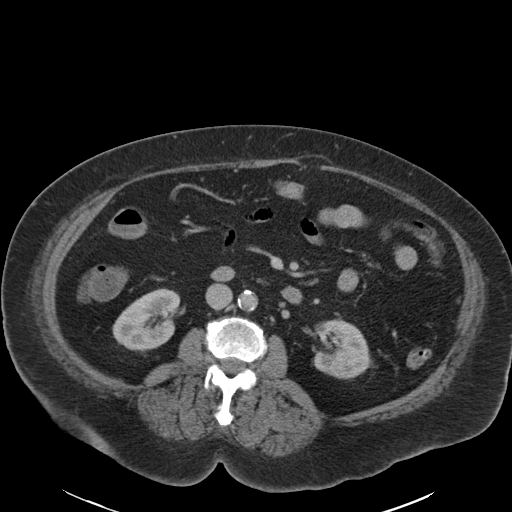
[im 60/90  bone]
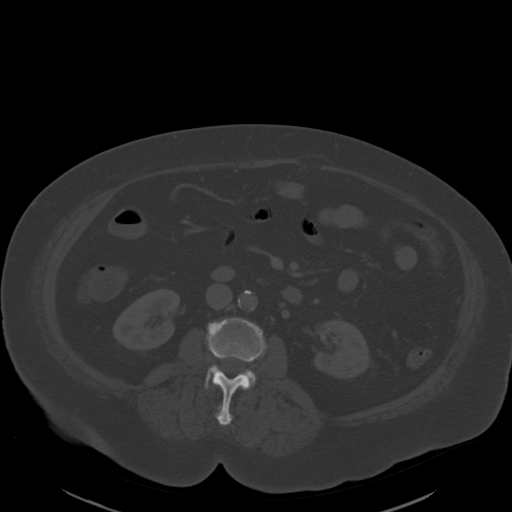
[im 66/90  soft-tissue]
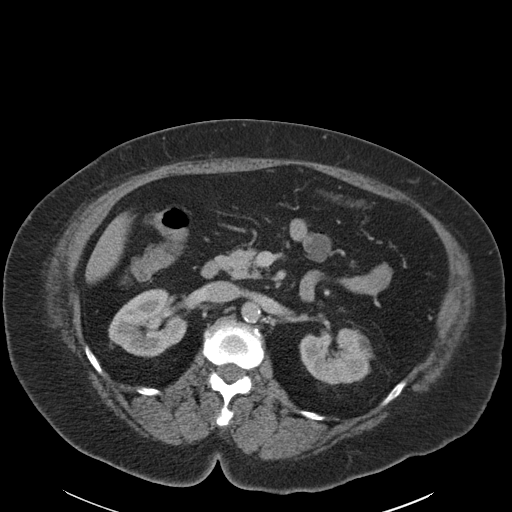
[im 72/90  soft-tissue]
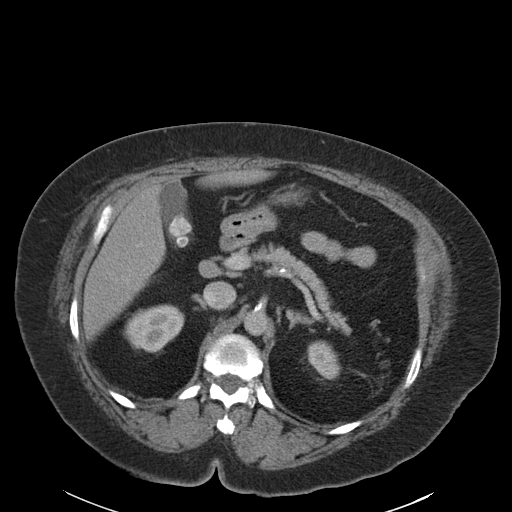
[im 78/90  soft-tissue]
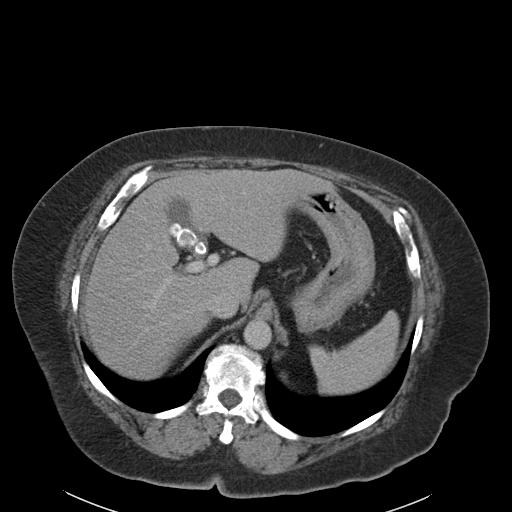
[im 84/90  soft-tissue]
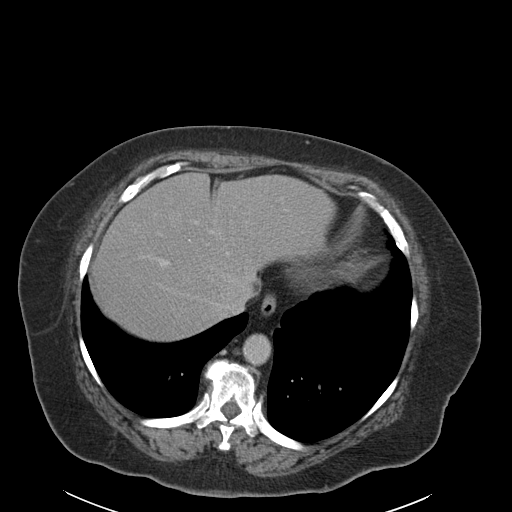

[Series 5: coronal st · coronal · 0.85mm/px · 3 of 101 slices shown]
[im 34/101  soft-tissue]
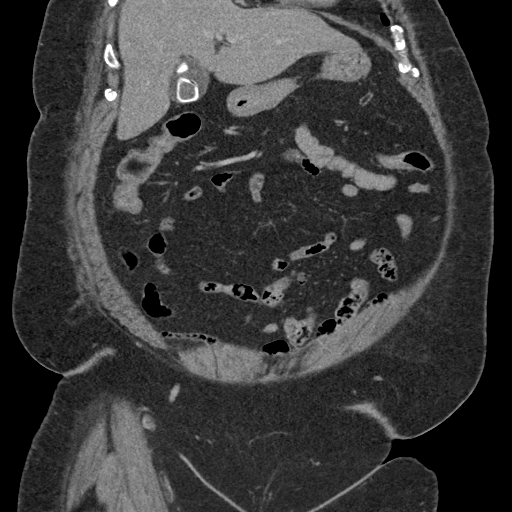
[im 45/101  soft-tissue]
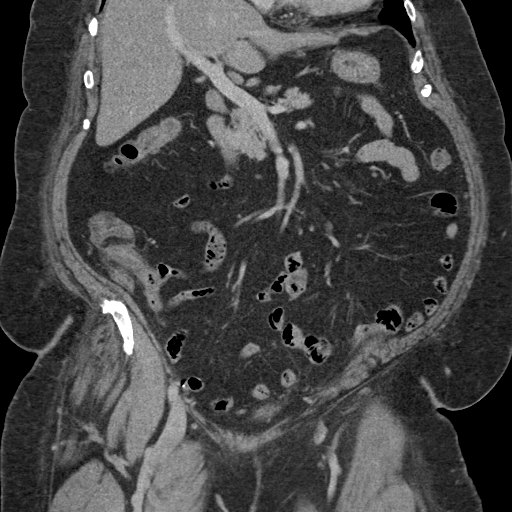
[im 56/101  soft-tissue]
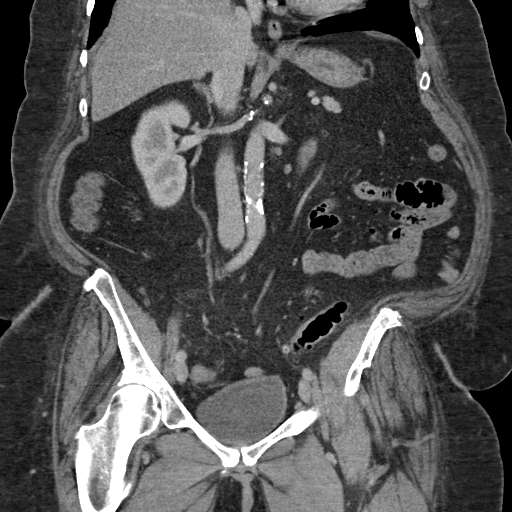

[16 of 46 positions shown; findings below may reference images not displayed]

FINDINGS: Lower chest: The visualized lung bases are clear.

No intra-abdominal free air or free fluid.

Hepatobiliary: Probable mild fatty liver. No intrahepatic biliary
ductal dilatation. Multiple stones in the gallbladder. No
pericholecystic fluid or evidence of acute cholecystitis by CT.

Pancreas: Unremarkable. No pancreatic ductal dilatation or
surrounding inflammatory changes.

Spleen: Normal in size without focal abnormality.

Adrenals/Urinary Tract: The adrenal glands are unremarkable. There
is no hydronephrosis on either side. There is symmetric enhancement
and excretion of contrast by both kidneys. Subcentimeter left renal
hypodense lesions are too small to characterize. The visualized
ureters and urinary bladder appear unremarkable.

Stomach/Bowel: Postsurgical changes of partial sigmoid resection
with anastomotic suture. There is sigmoid diverticulosis without
active inflammatory changes. No bowel obstruction or active
inflammation. There is a 2.5 cm jejunal diverticulum in the left
upper abdomen. The appendix is normal.

Vascular/Lymphatic: Advanced aortoiliac atherosclerotic disease. The
IVC is unremarkable. No portal venous gas. There is no adenopathy.

Reproductive: Hysterectomy. No pelvic mass.

Other: Multiple midline fat containing ventral hernias. No
associated inflammatory changes or fluid collection.

Musculoskeletal: Degenerative changes of the spine. No acute osseous
pathology.
IMPRESSION: 1. No acute intra-abdominal or pelvic pathology. No bowel
obstruction or active inflammation. Normal appendix.
2. Sigmoid diverticulosis.
3. Cholelithiasis.

## 2018-08-31 MED ORDER — SODIUM CHLORIDE 0.9 % IJ SOLN
INTRAMUSCULAR | Status: AC
  Filled 2018-08-31: qty 50

## 2018-08-31 MED ORDER — IOPAMIDOL (ISOVUE-300) INJECTION 61%
100.0000 mL | Freq: Once | INTRAVENOUS | Status: AC | PRN
  Administered 2018-08-31: 100 mL via INTRAVENOUS

## 2018-08-31 MED ORDER — IOPAMIDOL (ISOVUE-300) INJECTION 61%
INTRAVENOUS | Status: AC
  Filled 2018-08-31: qty 100

## 2018-08-31 MED ORDER — SODIUM CHLORIDE 0.9 % IV BOLUS
1000.0000 mL | Freq: Once | INTRAVENOUS | Status: AC
  Administered 2018-08-31: 1000 mL via INTRAVENOUS

## 2018-08-31 MED ORDER — LOPERAMIDE HCL 2 MG PO CAPS
4.0000 mg | ORAL_CAPSULE | Freq: Once | ORAL | Status: AC
  Administered 2018-08-31: 4 mg via ORAL
  Filled 2018-08-31: qty 2

## 2018-08-31 NOTE — ED Provider Notes (Signed)
[facility] DEPT Provider Note   CSN: [id number] Arrival date & time: 08/31/18  1806     History   Chief Complaint Chief Complaint  Patient presents with  . Abdominal Pain  . Diarrhea    HPI [name] is a 77 y.o. female.  HPI Patient presents to the emergency department with abdominal discomfort across the lower abdomen and diarrhea for 2 days.  The patient states that she went out to eat and then several hours later started with diarrhea the patient states that she had a history of diverticulitis.  She states that she had a portion of her colon removed.  The patient denies chest pain, shortness of breath, headache,blurred vision, neck pain, fever, cough, weakness, numbness, dizziness, anorexia, edema, nausea, vomiting, rash, back pain, dysuria, hematemesis, bloody stool, near syncope, or syncope. Past Medical History:  Diagnosis Date  . Abdominal pain   . Arthritis    cervical disc degeneration/ oa left knee, carpal tunnel rt wrist, adhesive capsulitis right shoulder, rt hand weakness; lumbar degeneration  . Carpal tunnel syndrome   . Colon adenoma   . 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 Constipation   . Diabetes mellitus without complication ([place])    borderline - diet control  . Diverticulitis hx of  . Diverticulosis   . Frequent UTI    hx of urethral injury during colon surgery - states frequent uti's since  . GERD (gastroesophageal reflux disease)   . H/O hiatal hernia   . History of palpitations    in the past  . History of shingles    has a lingering itching on back where shingles were  . Hyperlipidemia   . Hypertension   . Numbness and tingling in right hand    pt. states has numbness of right hand very frequently-watch positioning  . Obesity   .  Osteoarthritis   . Osteoporosis   . Pain    pain left knee and pain right hip and right groin  . Personal history of colonic polyps-adenoma 08/26/2008  . Pneumonia 2005  . Vitamin D deficiency     Patient Active Problem List   Diagnosis Date Noted  . Constipation 06/26/2018  . Hyperlipidemia 08/23/2014  . Type II or unspecified type diabetes mellitus without mention of complication, uncontrolled 06/19/2014  . Mixed hyperlipidemia 06/19/2014  . S/P urological surgery 09/30/2013  . Joint pain 09/30/2013  . UTI (urinary tract infection) 09/30/2013  . Cough 12/10/2012  . OA (osteoarthritis) of knee 01/05/2012  . Spasm of lumbar paraspinous muscle 06/17/2011  . Morbidly obese ([place]) 05/31/2011  . HIP PAIN, LEFT, CHRONIC 11/09/2010  . CARPAL TUNNEL SYNDROME, RIGHT 10/14/2010  . KNEE PAIN, RIGHT, CHRONIC 10/14/2010  . LEFT BUNDLE BRANCH BLOCK 07/26/2010  . 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 FATIGUE 07/01/2010  . CHEST PAIN, ATYPICAL 07/01/2010  . EPIGASTRIC PAIN 07/01/2010  . SUPRAPUBIC PAIN 07/01/2010  . HYPERGLYCEMIA 07/01/2010  . CELLULITIS AND ABSCESS OF OTHER SPECIFIED SITE 02/15/2010  . LUMP OR MASS IN BREAST 01/10/2010  . DYSURIA, CHRONIC 03/09/2009  . GERD 10/06/2008  . Personal history of colonic polyps-adenoma 08/26/2008  . LOC OSTEOARTHROS NOT SPEC PRIM/SEC LOWER LEG 09/10/2007  . GOUT, UNSPECIFIED 09/09/2007  . DIVERTICULOSIS, COLON 09/09/2007  . DIVERTICULITIS, HX OF 09/09/2007  . HYPERLIPIDEMIA 06/03/2007  .  Essential hypertension 06/03/2007    Past Surgical History:  Procedure Laterality Date  . ABDOMINAL HYSTERECTOMY    . APPENDECTOMY    . bladder tack    . BREAST BIOPSY Left   . BREAST SURGERY     breast duct resection- benign  . CARPAL TUNNEL RELEASE Right 06/04/2014   Procedure: RIGHT CARPAL TUNNEL RELEASE;  Surgeon: Roseanne Kaufman, MD;  Location: [facility];  Service: Orthopedics;  Laterality: Right;  . COLON RESECTION  2008    hx diverticulosis  . COLON SURGERY    . colonoscopy [id number]    . JOINT REPLACEMENT    . OOPHORECTOMY    . skin graft left arm - traumatic compression injury left upper arm    . temporary ureter stent    . TOTAL KNEE ARTHROPLASTY  01/05/2012   Procedure: TOTAL KNEE ARTHROPLASTY;  Surgeon: [name], MD;  Location: [facility];  Service: Orthopedics;  Laterality: Right;  . TOTAL KNEE ARTHROPLASTY Left 08/17/2014   Procedure: LEFT TOTAL KNEE ARTHROPLASTY;  Surgeon: [name], MD;  Location: [facility];  Service: Orthopedics;  Laterality: Left;  . ureter repair for tyransected left ureter       OB History   None      Home Medications    Prior to Admission medications   Medication Sig Start Date End Date Taking? Authorizing Provider  allopurinol (ZYLOPRIM) 300 MG tablet Take 300 mg by mouth daily.    [provider]  amLODipine-benazepril (LOTREL) 5-10 MG capsule Take 1 capsule by mouth daily. 08/27/18   [name], MD  aspirin EC 81 MG tablet Take 81 mg by mouth daily.    [provider]  atenolol-chlorthalidone (TENORETIC) 50-25 MG per tablet Take 1 tablet by mouth daily. @@ 11 am 12/09/14   Dutch Quint B, FNP  Dulaglutide (TRULICITY) 4.01 UU/7.2ZD SOPN Inject into the skin. 1 per week 07/03/18   [provider]  famotidine (PEPCID) 20 MG tablet Take 20 mg by mouth daily as needed for heartburn or indigestion.     [provider]  magnesium oxide (MAG-OX) 400 MG tablet Take 400 mg by mouth daily. 1 tablet daily 06/04/17   [provider]  MELATONIN PO Take 2 tablets by mouth at bedtime as needed (For sleep.).    [provider]  Multiple Vitamin (MULTIVITAMIN) capsule Take 1 capsule by mouth daily.    [provider]  Multiple Vitamins-Minerals (IMMUNE SUPPORT PO) Take by mouth.    [provider]  Jonetta Speak LANCETS 66Y MISC Use to check blood glucose once daily 06/19/14   Dutch Quint B, FNP    Probiotic Product (PROBIOTIC DAILY PO) Take 3 capsules by mouth daily.    [provider]  rosuvastatin (CRESTOR) 20 MG tablet Take 1 tablet (20 mg total) by mouth daily. 07/31/17   Skeet Latch, MD    Family History Family History  Problem Relation Age of Onset  . Breast cancer Mother 73  . Hypertension Mother   . Prostate cancer Father   . Colon cancer Father 43  . Hypertension Father   . Dementia Sister   . Lung cancer Brother   . Hypertension Brother   . COPD Brother   . Cancer Maternal Grandmother   . Arthritis Sister   . Hypertension Sister   . Arthritis Sister   . Hypertension Child   . Hypertension Child   . Hypertension Child     Social History Social History  Tobacco Use  . Smoking status: Never Smoker  . Smokeless tobacco: Never Used  Substance Use Topics  . Alcohol use: No  . Drug use: No     Allergies   Bee venom; Lipitor [atorvastatin calcium]; Oxycodone; and Vicodin [hydrocodone-acetaminophen]   Review of Systems Review of Systems All other systems negative except as documented in the HPI. All pertinent positives and negatives as reviewed in the HPI.  Physical Exam Updated Vital Signs BP (!) 149/74   Pulse 73   Temp 97.8 F (36.6 C) (Oral)   Resp 18   SpO2 99%   Physical Exam  Constitutional: She is oriented to person, place, and time. She appears well-developed and well-nourished. No distress.  HENT:  Head: Normocephalic and atraumatic.  Mouth/Throat: Oropharynx is clear and moist.  Eyes: Pupils are equal, round, and reactive to light.  Neck: Normal range of motion. Neck supple.  Cardiovascular: Normal rate, regular rhythm and normal heart sounds. Exam reveals no gallop and no friction rub.  No murmur heard. Pulmonary/Chest: Effort normal and breath sounds normal. No respiratory distress. She has no wheezes.  Abdominal: Soft. Bowel sounds are normal. She exhibits no distension. There is tenderness in the left upper  quadrant and left lower quadrant.  Neurological: She is alert and oriented to person, place, and time. She exhibits normal muscle tone. Coordination normal.  Skin: Skin is warm and dry. Capillary refill takes less than 2 seconds. No rash noted. No erythema.  Psychiatric: She has a normal mood and affect. Her behavior is normal.  Nursing note and vitals reviewed.    ED Treatments / Results  Labs (all labs ordered are listed, but only abnormal results are displayed) Labs Reviewed  COMPREHENSIVE METABOLIC PANEL - Abnormal; Notable for the following components:      Result Value   Glucose, Bld 110 (*)    GFR calc non Af Amer 55 (*)    All other components within normal limits  CBC - Abnormal; Notable for the following components:   WBC 13.2 (*)    All other components within normal limits  LIPASE, BLOOD  URINALYSIS, ROUTINE W REFLEX MICROSCOPIC    EKG None  Radiology No results found.  Procedures Procedures (including critical care time)  Medications Ordered in ED Medications  iopamidol (ISOVUE-300) 61 % injection (has no administration in time range)  sodium chloride 0.9 % injection (has no administration in time range)  sodium chloride 0.9 % bolus 1,000 mL (1,000 mLs Intravenous New Bag/Given 08/31/18 2147)  iopamidol (ISOVUE-300) 61 % injection 100 mL (100 mLs Intravenous Contrast Given 08/31/18 2216)     Initial Impression / Assessment and Plan / ED Course  I have reviewed the triage vital signs and the nursing notes.  Pertinent labs & imaging results that were available during my care of the patient were reviewed by me and considered in my medical decision making (see chart for details).    Patient CT scan did not show any significant abnormality at this time.  I do feel that this was related to eating out at the restaurant.  I will advise her to follow-up with her primary doctor.  Told to return here as needed.  The patient has no recent antibiotic usage so doubt C.  difficile colitis.   Final Clinical Impressions(s) / ED Diagnoses   Final diagnoses:  None    ED Discharge Orders    None       Dalia Heading, PA-C 08/31/18 [place],  Shanon Brow, MD 09/04/18 [phone]

## 2018-08-31 NOTE — ED Notes (Signed)
Patient transported to CT 

## 2018-08-31 NOTE — Discharge Instructions (Signed)
Follow-up with your doctor.  Your CT scan did not show any abnormalities at this time.  Increase your fluid intake.  Take Imodium to help control your diarrhea.

## 2018-08-31 NOTE — ED Triage Notes (Signed)
Pt complains of lower abdominal pain and diarrhea x 2 days. Pt denies nausea/emsis. Pt has had 7 inches of her colon removed d/t diverticulitis.

## 2018-09-02 ENCOUNTER — Telehealth: Payer: Self-pay

## 2018-09-02 NOTE — Telephone Encounter (Signed)
Left the pt a message that I was calling to schedule her an appt for a hospital f/u.

## 2018-09-03 ENCOUNTER — Ambulatory Visit (INDEPENDENT_AMBULATORY_CARE_PROVIDER_SITE_OTHER): Payer: Medicare Other | Admitting: Internal Medicine

## 2018-09-03 ENCOUNTER — Encounter: Payer: Self-pay | Admitting: Internal Medicine

## 2018-09-03 VITALS — BP 128/80 | HR 67 | Temp 98.2°F | Ht 65.0 in | Wt 248.2 lb

## 2018-09-03 DIAGNOSIS — E860 Dehydration: Secondary | ICD-10-CM

## 2018-09-03 DIAGNOSIS — A09 Infectious gastroenteritis and colitis, unspecified: Secondary | ICD-10-CM

## 2018-09-03 LAB — CBC WITH DIFFERENTIAL/PLATELET
Basophils Absolute: 0.1 10*3/uL (ref 0.0–0.2)
Basos: 0 %
EOS (ABSOLUTE): 0.2 10*3/uL (ref 0.0–0.4)
Eos: 2 %
Hematocrit: 42.1 % (ref 34.0–46.6)
Hemoglobin: 14.5 g/dL (ref 11.1–15.9)
Immature Grans (Abs): 0 10*3/uL (ref 0.0–0.1)
Immature Granulocytes: 0 %
Lymphocytes Absolute: 2.9 10*3/uL (ref 0.7–3.1)
Lymphs: 23 %
MCH: 30.8 pg (ref 26.6–33.0)
MCHC: 34.4 g/dL (ref 31.5–35.7)
MCV: 89 fL (ref 79–97)
Monocytes Absolute: 0.9 10*3/uL (ref 0.1–0.9)
Monocytes: 7 %
Neutrophils Absolute: 8.6 10*3/uL — ABNORMAL HIGH (ref 1.4–7.0)
Neutrophils: 68 %
Platelets: 252 10*3/uL (ref 150–450)
RBC: 4.71 x10E6/uL (ref 3.77–5.28)
RDW: 13.5 % (ref 12.3–15.4)
WBC: 12.8 10*3/uL — ABNORMAL HIGH (ref 3.4–10.8)

## 2018-09-03 LAB — CMP14 + ANION GAP
ALT: 25 IU/L (ref 0–32)
AST: 31 IU/L (ref 0–40)
Albumin/Globulin Ratio: 1.7 (ref 1.2–2.2)
Albumin: 4.2 g/dL (ref 3.5–4.8)
Alkaline Phosphatase: 54 IU/L (ref 39–117)
Anion Gap: 17 mmol/L (ref 10.0–18.0)
BUN/Creatinine Ratio: 16 (ref 12–28)
BUN: 18 mg/dL (ref 8–27)
Bilirubin Total: 1 mg/dL (ref 0.0–1.2)
CO2: 22 mmol/L (ref 20–29)
Calcium: 9.8 mg/dL (ref 8.7–10.3)
Chloride: 101 mmol/L (ref 96–106)
Creatinine, Ser: 1.14 mg/dL — ABNORMAL HIGH (ref 0.57–1.00)
GFR calc Af Amer: 54 mL/min/{1.73_m2} — ABNORMAL LOW (ref >59)
GFR calc non Af Amer: 47 mL/min/{1.73_m2} — ABNORMAL LOW (ref >59)
Globulin, Total: 2.5 g/dL (ref 1.5–4.5)
Glucose: 105 mg/dL — ABNORMAL HIGH (ref 65–99)
Potassium: 3.8 mmol/L (ref 3.5–5.2)
Sodium: 140 mmol/L (ref 134–144)
Total Protein: 6.7 g/dL (ref 6.0–8.5)

## 2018-09-03 MED ORDER — CIPROFLOXACIN HCL 500 MG PO TABS: 500.0000 mg | ORAL_TABLET | Freq: Two times a day (BID) | ORAL | 0 refills | Status: AC

## 2018-09-03 MED ORDER — HYDROCORTISONE 1 % EX CREA: TOPICAL_CREAM | CUTANEOUS | 1 refills | Status: DC

## 2018-09-03 NOTE — Patient Instructions (Addendum)
Get Pepto Bismal and take it as directed to help slow down the diarrhea.  I suspect you have a bacterial cause of diarrhea, so start on the Cipro today.  Get All sports or Gatorade ( clear, or yellow) and drink 16-32 oz a day.  Dehydration Dehydration is when there is not enough fluid or water in your body. This happens when you lose more fluids than you take in. People who are age 76 or older have a higher risk of getting dehydrated. Dehydration can range from mild to very bad. It should be treated right away to keep it from getting very bad. Symptoms of mild dehydration may include:  Thirst.  Dry lips.  Slightly dry mouth.  Dry, warm skin.  Dizziness. Symptoms of moderate dehydration may include:  Very dry mouth.  Muscle cramps.  Dark pee (urine). Pee may be the color of tea.  Your body making less pee.  Your eyes making fewer tears.  Heartbeat that is uneven or faster than normal (palpitations).  Headache.  Light-headedness, especially when you stand up from sitting.  Fainting (syncope). Symptoms of very bad dehydration may include:  Changes in skin, such as: ? Cold and clammy skin. ? Blotchy (mottled) or pale skin. ? Skin that does not quickly return to normal after being lightly pinched and let go (poor skin turgor).  Changes in body fluids, such as: ? Feeling very thirsty. ? Your eyes making fewer tears. ? Not sweating when body temperature is high, such as in hot weather. ? Your body making very little pee.  Changes in vital signs, such as: ? Weak pulse. ? Pulse that is more than 100 beats a minute when you are sitting still. ? Fast breathing. ? Low blood pressure.  Other changes, such as: ? Sunken eyes. ? Cold hands and feet. ? Confusion. ? Lack of energy (lethargy). ? Trouble waking up from sleep. ? Short-term weight loss. ? Unconsciousness. Follow these instructions at home:  If told by your doctor, drink an ORS: ? Make an ORS by using  instructions on the package. ? Start by drinking small amounts, about  cup (120 mL) every 5-10 minutes. ? Slowly drink more until you have had the amount that your doctor said to have.  Drink enough clear fluid to keep your pee clear or pale yellow. If you were told to drink an ORS, finish the ORS first, then start slowly drinking clear fluids. Drink fluids such as: ? Water. Do not drink only water by itself. Doing that can make the salt (sodium) level in your body get too low (hyponatremia). ? Ice chips. ? Fruit juice that you have added water to (diluted). ? Low-calorie sports drinks.  Avoid: ? Alcohol. ? Drinks that have a lot of sugar. These include high-calorie sports drinks, fruit juice that does not have water added, and soda. ? Caffeine. ? Foods that are greasy or have a lot of fat or sugar.  Take over-the-counter and prescription medicines only as told by your doctor.  Do not take salt tablets. Doing that can make the salt level in your body get too high (hypernatremia).  Eat foods that have minerals (electrolytes). Examples include bananas, oranges, potatoes, tomatoes, and spinach.  Keep all follow-up visits as told by your doctor. This is important. Contact a doctor if:  You have belly (abdominal) pain that: ? Gets worse. ? Stays in one area (localizes).  You have a rash.  You have a stiff neck.  You get  angry or annoyed more easily than normal (irritability).  You are more sleepy than normal.  You have a harder time waking up than normal.  You feel: ? Weak. ? Dizzy. ? Very thirsty. Get help right away if:  You have symptoms of very bad dehydration.  You cannot drink fluids without throwing up (vomiting).  Your symptoms get worse with treatment.  You have a fever.  You have a very bad headache.  You are throwing up or having watery poop (diarrhea) and it: ? Gets worse. ? Does not go away.  You have diarrhea for more than 24 hours.  You have  blood or something green (bile) in your throw-up.  You have blood in your poop (stool). This may cause poop to look black and tarry.  You have not peed in 6-8 hours.  You have peed (urinated) only a small amount of very dark pee during 6-8 hours.  You pass out (faint).  Your heart rate when you are sitting still is more than 100 beats a minute.  You have trouble breathing. This information is not intended to replace advice given to you by your health care provider. Make sure you discuss any questions you have with your health care provider. Document Released: 10/05/2011 Document Revised: 05/05/2016 Document Reviewed: 12/10/2015 Elsevier Interactive Patient Education  2018 [place] following: Bananas, apple sauce, potatoes, rice, pasta. And avoid dairy.    Salmonella Gastroenteritis, Adult Salmonella gastroenteritis is an infection of the intestines that can cause nausea, vomiting, and other symptoms. The infection usually lasts from 2 to 7 days. What are the causes? This condition is caused by salmonella bacteria. These bacteria can spread through a person's stool. You can get this infection by:  Eating food or drinking liquids that have the bacteria on it.  Drinking polluted, standing water.  Coming into contact with an animal that is carrying the bacteria.  What increases the risk? This condition is more likely to develop in:  Elderly adults.  People with a weakened immune system.  People with poor personal or kitchen hygiene.  What are the signs or symptoms? Symptoms of this condition include:  Nausea.  Vomiting.  Abdominal pain or cramps.  Diarrhea, which may be bloody.  Fever.  A headache.  How is this diagnosed? This condition may be diagnosed based on your medical history, a physical exam, and a blood or stool test. How is this treated? Often, the only treatment needed is to drink plenty of fluids. Drinking fluids is important because this  infection can make you dehydrated. If your condition is severe, you may be prescribed an antibiotic medicine to help shorten the illness. Most people recover completely, but some people may develop lasting problems such as arthritis, irritation of the eyes, or painful urination. Follow these instructions at home: Drinking  Drink enough fluid to keep your urine clear or pale yellow. This helps prevent dehydration.  Until your diarrhea, nausea, or vomiting is under control, drink only clear liquids. Clear liquids are liquids you can see through, such as water, broth, or non-caffeinated tea. Avoid milk, fruit juice, alcohol, and very hot or very cold drinks.  If you are dehydrated, ask your health care provider for specific rehydration instructions. Signs of dehydration include: ? Thirst. ? Dry lips or mouth. ? Dry, warm skin. ? Dark urine. ? A headache. Eating and drinking  If you are not hungry, do not force yourself to eat.  If you are hungry, eat a well-balanced  diet that includes: ? A variety of complex carbohydrates, such as rice, wheat, potatoes, and bread. ? Lean meats. ? Yogurt. ? Fruits. ? Vegetables.  Avoid high-fat foods. They are difficult to digest.  If you have diarrhea, do not prepare food. Medicines  Take over-the-counter and prescription medicines only as told by your health care provider.  If you were prescribed an antibiotic medicine, take it as told by your health care provider. Do not stop taking the antibiotic even if you start to feel better. Other Instructions   Wash your hands well. This helps keep the bacteria from spreading to others.  Keep track of changes in your weight. Losing a lot of weight can be a sign of a serious problem. Ask your health care provider how much weight loss should concern you.  Keep all follow-up visits as told by your health care provider. How is this prevented? To prevent future salmonella infections:  Handle meat, eggs,  seafood, and poultry properly.  Always cook meat, eggs, seafood, and poultry thoroughly.  Wash your hands and counters thoroughly after handling or preparing meat, eggs, seafood, and poultry.  Wash your hands thoroughly after handling animals.  Get help right away if:  You cannot keep fluids down.  You keep vomiting or having diarrhea.  You have abdominal pain that gets worse.  You have abdominal pain in one small area.  Your diarrhea has more blood or mucus than before.  You have a fever.  You have any symptoms of severe dehydration. These include: ? Extreme thirst. ? Confusion. ? Inability to sweat. ? Fainting. ? Dizziness. ? Weight loss. This information is not intended to replace advice given to you by your health care provider. Make sure you discuss any questions you have with your health care provider. Document Released: 10/13/2000 Document Revised: 03/23/2016 Document Reviewed: 07/10/2015 Elsevier Interactive Patient Education  2018 [employer].                  Diarrhea, Adult Diarrhea is when you have loose and water poop (stool) often. Diarrhea can make you feel weak and cause you to get dehydrated. Dehydration can make you tired and thirsty, make you have a dry mouth, and make it so you pee (urinate) less often. Diarrhea often lasts 2-3 days. However, it can last longer if it is a sign of something more serious. It is important to treat your diarrhea as told by your doctor. Follow these instructions at home: Eating and drinking  Follow these recommendations as told by your doctor:  Take an oral rehydration solution (ORS). This is a drink that is sold at pharmacies and stores.  Drink clear fluids, such as: ? Water. ? Ice chips. ? Diluted fruit juice. ? Low-calorie sports drinks.  Eat bland, easy-to-digest foods in small amounts as you are able. These foods include: ? Bananas. ? Applesauce. ? Rice. ? Low-fat (lean)  meats. ? Toast. ? Crackers.  Avoid drinking fluids that have a lot of sugar or caffeine in them.  Avoid alcohol.  Avoid spicy or fatty foods.  General instructions   Drink enough fluid to keep your pee (urine) clear or pale yellow.  Wash your hands often. If you cannot use soap and water, use hand sanitizer.  Make sure that all people in your home wash their hands well and often.  Take over-the-counter and prescription medicines only as told by your doctor.  Rest at home while you get better.  Watch your condition for any changes.  Take a warm bath to help with any burning or pain from having diarrhea.  Keep all follow-up visits as told by your doctor. This is important. Contact a doctor if:  You have a fever.  Your diarrhea gets worse.  You have new symptoms.  You cannot keep fluids down.  You feel light-headed or dizzy.  You have a headache.  You have muscle cramps. Get help right away if:  You have chest pain.  You feel very weak or you pass out (faint).  You have bloody or black poop or poop that look like tar.  You have very bad pain, cramping, or bloating in your belly (abdomen).  You have trouble breathing or you are breathing very quickly.  Your heart is beating very quickly.  Your skin feels cold and clammy.  You feel confused.  You have signs of dehydration, such as: ? Dark pee, hardly any pee, or no pee. ? Cracked lips. ? Dry mouth. ? Sunken eyes. ? Sleepiness. ? Weakness. This information is not intended to replace advice given to you by your health care provider. Make sure you discuss any questions you have with your health care provider. Document Released: 04/03/2008 Document Revised: 05/05/2016 Document Reviewed: 06/22/2015 Elsevier Interactive Patient Education  2018 [employer].

## 2018-09-03 NOTE — Progress Notes (Signed)
Subjective:     Patient ID: [name], female   DOB: 1941/02/16, 77 y.o.   MRN: [id number] CC- Diarrhea HPI Pt present with unresolved diarrhea. Developed after eating out at a buffet placed where she ate cabage& chicken, and started right away while at the restaurant. She was seen in ER 11/2 and her WBC was 13K, CT of abd neg.  Pt has been having diarrhea at least 15 x/d. Her bottom is raw and her hemorrhoid is acting up. Saw blood in stool today. Has abdominal cramping on lower abd. Denies UTI symptoms. Has been trying to drink more fluids.  Past Medical History:  Diagnosis Date  . Abdominal pain   . Arthritis    cervical disc degeneration/ oa left knee, carpal tunnel rt wrist, adhesive capsulitis right shoulder, rt hand weakness; lumbar degeneration  . Carpal tunnel syndrome   . Colon adenoma   . 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 Constipation   . Diabetes mellitus without complication ([place])    borderline - diet control  . Diverticulitis hx of  . Diverticulosis   . Frequent UTI    hx of urethral injury during colon surgery - states frequent uti's since  . GERD (gastroesophageal reflux disease)   . H/O hiatal hernia   . History of palpitations    in the past  . History of shingles    has a lingering itching on back where shingles were  . Hyperlipidemia   . Hypertension   . Numbness and tingling in right hand    pt. states has numbness of right hand very frequently-watch positioning  . Obesity   . Osteoarthritis   . Osteoporosis   . Pain    pain left knee and pain right hip and right groin  . Personal history of colonic polyps-adenoma 08/26/2008  . Pneumonia 2005  . Vitamin D deficiency     Atorvastatin calcium; Bee venom; Lipitor [atorvastatin calcium]; Oxycodone; and Vicodin  [hydrocodone-acetaminophen]  Outpatient Medications Prior to Visit  Medication Sig Dispense Refill  . allopurinol (ZYLOPRIM) 300 MG tablet Take 300 mg by mouth daily.    Marland Kitchen amLODipine-benazepril (LOTREL) 5-10 MG capsule Take 1 capsule by mouth daily. 90 capsule 1  . aspirin EC 81 MG tablet Take 81 mg by mouth daily.    Marland Kitchen atenolol-chlorthalidone (TENORETIC) 50-25 MG per tablet Take 1 tablet by mouth daily. @@ 11 am 30 tablet 0  . Dulaglutide (TRULICITY) 4.09 WJ/1.9JY SOPN Inject into the skin. 1 per week    . famotidine (PEPCID) 20 MG tablet Take 20 mg by mouth daily as needed for heartburn or indigestion.     . magnesium oxide (MAG-OX) 400 MG tablet Take 400 mg by mouth daily. 1 tablet daily    . MELATONIN PO Take 2 tablets by mouth at bedtime as needed (For sleep.).    Marland Kitchen Multiple Vitamin (MULTIVITAMIN) capsule Take 1 capsule by mouth daily.    . Multiple Vitamins-Minerals (IMMUNE SUPPORT PO) Take by mouth.    [name] DELICA LANCETS 78G MISC Use to check blood glucose once daily 100 each 3  . Probiotic Product (PROBIOTIC DAILY PO) Take 3 capsules by mouth daily.    . rosuvastatin (CRESTOR) 20 MG tablet Take 1 tablet (20 mg total) by mouth daily. 90 tablet 3   No facility-administered  medications prior to visit.      Review of Systems  Constitutional: Positive for chills, fatigue and fever.  Cardiovascular: Negative for palpitations.  Gastrointestinal: Positive for abdominal pain, blood in stool and diarrhea. Negative for abdominal distention, constipation, nausea and vomiting.  Genitourinary: Negative for dysuria, frequency and urgency.  Musculoskeletal: Negative for gait problem.  Skin: Negative for rash.  Neurological: Positive for weakness. Negative for dizziness.       Objective:   Physical Exam  Constitutional: She is oriented to person, place, and time. She appears well-developed and well-nourished. No distress.  HENT:  Head: Normocephalic.  Right Ear: External ear  normal.  Left Ear: External ear normal.  Nose: Nose normal.  Eyes: Conjunctivae are normal. Right eye exhibits no discharge. Left eye exhibits no discharge. No scleral icterus.  Neck: Neck supple.  Cardiovascular: Normal rate and regular rhythm.  No murmur heard. Pulmonary/Chest: Effort normal and breath sounds normal.  Abdominal: Soft. Bowel sounds are normal. She exhibits no distension and no mass. There is tenderness. There is no rebound and no guarding.  Has mild tenderness on lower abd  Lymphadenopathy:    She has no cervical adenopathy.  Neurological: She is alert and oriented to person, place, and time.  Skin: Skin is warm and dry. She is not diaphoretic.  Psychiatric: She has a normal mood and affect. Her behavior is normal. Judgment and thought content normal.  Nursing note and vitals reviewed.  Today's Vitals   09/03/18 1033 09/03/18 1048 09/03/18 1050 09/03/18 1051  BP: 128/80     Pulse: 67     Temp: 98.2 F (36.8 C)     TempSrc: Oral     SpO2: 96% 96% 97% 97%  Weight: 248 lb 3.2 oz (112.6 kg)     Height: 5\' 5"  (1.651 m)      Body mass index is 41.3 kg/m.      Assessment:      1. Diarrhea of infectious origin - ciprofloxacin (CIPRO) 500 MG tablet; Take 1 tablet (500 mg total) by mouth 2 (two) times daily for 3 days.  Dispense: 6 tablet; Refill: 0 - Culture, Stool - Ova and parasite examination She was advised to follow BRAT diet and OK to take Pepto Bismal in the mean time.  2. Dehydration- mild, acute. Was advised to get clear or yellow Gatorade or All sports.  - CBC with Diff - CMP14 + Anion Gap       Plan:     Advised to take PeptBismal today to slow down the frequency of diarrhea  Stool studies ordered.  Fu with me tomorrow. If she gets worse tonight , needs to go to ER. Orders Placed This Encounter  Procedures  . Culture, Stool  . Ova and parasite examination  . CBC with Diff  . CMP14 + Anion Gap

## 2018-09-04 ENCOUNTER — Encounter: Payer: Self-pay | Admitting: Internal Medicine

## 2018-09-04 ENCOUNTER — Ambulatory Visit (INDEPENDENT_AMBULATORY_CARE_PROVIDER_SITE_OTHER): Payer: Medicare Other | Admitting: Internal Medicine

## 2018-09-04 VITALS — BP 140/80 | HR 72 | Temp 98.5°F | Ht 65.0 in | Wt 244.0 lb

## 2018-09-04 DIAGNOSIS — Z1212 Encounter for screening for malignant neoplasm of rectum: Secondary | ICD-10-CM

## 2018-09-04 DIAGNOSIS — K649 Unspecified hemorrhoids: Secondary | ICD-10-CM

## 2018-09-04 DIAGNOSIS — A09 Infectious gastroenteritis and colitis, unspecified: Secondary | ICD-10-CM | POA: Diagnosis not present

## 2018-09-04 DIAGNOSIS — E860 Dehydration: Secondary | ICD-10-CM | POA: Diagnosis not present

## 2018-09-04 NOTE — Patient Instructions (Signed)
CONTINUE WITH SAME DIET AND FLUIDS  COME SEE ME TOMORROW FOR ANOTHER FOLLOW UP  I BELIEVE THE BLEEDING MAY HAVE COME FROM THE INFECTION AND A LITTLE FROM THE HEMORRHOID.   I WANT YOUR GI DR TO CHECK THAT HARD AREA ON THE ANAL REGION, TO MAKE SURE IS NOTHING MORE SERIOUS.

## 2018-09-04 NOTE — Progress Notes (Signed)
Subjective:     Patient ID: [name] , female    DOB: 08/01/1941 , 77 y.o.   MRN: [id number]   Chief Complaint  Patient presents with  . Diarrhea    doing better, still feels weak     HPI Pt is here for FU diarrhea and dehydration. She states she has not had any more BMs since she left yesterday from here but ones. That BM at home had about 2 TBSP of blood in it, but nothing since.  Has been drinking Pedialite and eating BRAT diet.  She has taken 3 doses of Pepto and 3 doses of Cipro. No BM today.      Past Medical History:  Diagnosis Date  . Abdominal pain   . Arthritis    cervical disc degeneration/ oa left knee, carpal tunnel rt wrist, adhesive capsulitis right shoulder, rt hand weakness; lumbar degeneration  . Carpal tunnel syndrome   . Colon adenoma   . 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 Constipation   . Diabetes mellitus without complication ([place])    borderline - diet control  . Diverticulitis hx of  . Diverticulosis   . Frequent UTI    hx of urethral injury during colon surgery - states frequent uti's since  . GERD (gastroesophageal reflux disease)   . H/O hiatal hernia   . History of palpitations    in the past  . History of shingles    has a lingering itching on back where shingles were  . Hyperlipidemia   . Hypertension   . Numbness and tingling in right hand    pt. states has numbness of right hand very frequently-watch positioning  . Obesity   . Osteoarthritis   . Osteoporosis   . Pain    pain left knee and pain right hip and right groin  . Personal history of colonic polyps-adenoma 08/26/2008  . Pneumonia 2005  . Vitamin D deficiency      Family History  Problem Relation Age of Onset  . Breast cancer Mother 14  . Hypertension Mother   . Prostate cancer Father   .  Colon cancer Father 69  . Hypertension Father   . Dementia Sister   . Lung cancer Brother   . Hypertension Brother   . COPD Brother   . Cancer Maternal Grandmother   . Arthritis Sister   . Hypertension Sister   . Arthritis Sister   . Hypertension Child   . Hypertension Child   . Hypertension Child      Current Outpatient Medications:  .  allopurinol (ZYLOPRIM) 300 MG tablet, Take 300 mg by mouth daily., Disp: , Rfl:  .  amLODipine-benazepril (LOTREL) 5-10 MG capsule, Take 1 capsule by mouth daily., Disp: 90 capsule, Rfl: 1 .  aspirin EC 81 MG tablet, Take 81 mg by mouth daily., Disp: , Rfl:  .  atenolol-chlorthalidone (TENORETIC) 50-25 MG per tablet, Take 1 tablet by mouth daily. @@ 11 am, Disp: 30 tablet, Rfl: 0 .  ciprofloxacin (CIPRO) 500 MG tablet, Take 1 tablet (500 mg total) by mouth 2 (two) times daily for 3 days., Disp: 6 tablet, Rfl: 0 .  Dulaglutide (TRULICITY) 1.76 HY/0.7PX SOPN, Inject into the skin. 1 per week, Disp: , Rfl:  .  famotidine (PEPCID) 20 MG tablet, Take 20 mg by  mouth daily as needed for heartburn or indigestion. , Disp: , Rfl:  .  hydrocortisone cream 1 %, Apply to affected area 2 times daily, Disp: 30 g, Rfl: 1 .  magnesium oxide (MAG-OX) 400 MG tablet, Take 400 mg by mouth daily. 1 tablet daily, Disp: , Rfl:  .  MELATONIN PO, Take 2 tablets by mouth at bedtime as needed (For sleep.)., Disp: , Rfl:  .  Multiple Vitamin (MULTIVITAMIN) capsule, Take 1 capsule by mouth daily., Disp: , Rfl:  .  Multiple Vitamins-Minerals (IMMUNE SUPPORT PO), Take by mouth., Disp: , Rfl:  .  ONETOUCH DELICA LANCETS 86V MISC, Use to check blood glucose once daily, Disp: 100 each, Rfl: 3 .  Probiotic Product (PROBIOTIC DAILY PO), Take 3 capsules by mouth daily., Disp: , Rfl:  .  rosuvastatin (CRESTOR) 20 MG tablet, Take 1 tablet (20 mg total) by mouth daily., Disp: 90 tablet, Rfl: 3   Allergies  Allergen Reactions  . Atorvastatin Calcium Itching    Other reaction(s): Itching   . Bee Venom Hives  . Lipitor [Atorvastatin Calcium] Itching  . Oxycodone Other (See Comments)    hallucinations  . Vicodin [Hydrocodone-Acetaminophen] Other (See Comments)    hallucinations     Review of Systems  Constitutional: Positive for fatigue. Negative for chills, diaphoresis and fever.  Gastrointestinal: Positive for abdominal pain, anal bleeding and blood in stool. Negative for abdominal distention, constipation, diarrhea, nausea and vomiting.       She denies anal pain  Musculoskeletal: Negative for myalgias.  Skin: Negative for rash.  Neurological: Negative for weakness and light-headedness.     Today's Vitals   09/04/18 1101  BP: 140/80  Pulse: 72  Temp: 98.5 F (36.9 C)  TempSrc: Oral  SpO2: 96%  Weight: 244 lb (110.7 kg)  Height: 5\' 5"  (1.651 m)   Body mass index is 40.6 kg/m.   Objective:  Physical Exam  Constitutional: She appears well-developed and well-nourished.  She looks a little better than yesterday, not walking as weak as yesterday  HENT:  Head: Normocephalic.  Right Ear: External ear normal.  Left Ear: External ear normal.  Nose: Nose normal.  Mouth/Throat: Oropharynx is clear and moist.  Eyes: Conjunctivae are normal. Right eye exhibits no discharge. Left eye exhibits no discharge. No scleral icterus.  Neck: Neck supple.  Abdominal: Soft. Bowel sounds are normal. She exhibits no distension and no mass. There is no tenderness. There is no rebound and no guarding.  Has a bebe hard area on anal region around 5 o'clock which is not tender. Digital exam did not show any intra-anal massaes, but there was some pink matter on my glove. Hemoccult was positive.         Assessment And Plan:     1. Diarrhea of infectious origin- improving. Stool cultures are pending  2. Hemorrhoids, unspecified hemorrhoid type- mild with questionable hard area on anal region. I asked her to call her GI and make apt to have him look at this area.  I reviewed her CBC  from yesterday which was elevated around 12K, electrolytes were normal, and creatinine was slightly elevated.   3. Dehydration- not fully resolved yet. Needs to continue pushing fluids and following BRAT diet.  I will see her again tomorrow for a another FU. I will repeat her labs then.     Ezel Vallone RODRIGUEZ-SOUTHWORTH, PA-C

## 2018-09-05 ENCOUNTER — Other Ambulatory Visit: Payer: Self-pay | Admitting: Internal Medicine

## 2018-09-05 ENCOUNTER — Ambulatory Visit (INDEPENDENT_AMBULATORY_CARE_PROVIDER_SITE_OTHER): Payer: Medicare Other | Admitting: Internal Medicine

## 2018-09-05 ENCOUNTER — Encounter: Payer: Self-pay | Admitting: Internal Medicine

## 2018-09-05 VITALS — BP 140/80 | HR 77 | Temp 98.3°F | Ht 65.0 in | Wt 246.0 lb

## 2018-09-05 DIAGNOSIS — N289 Disorder of kidney and ureter, unspecified: Secondary | ICD-10-CM | POA: Diagnosis not present

## 2018-09-05 DIAGNOSIS — Z87898 Personal history of other specified conditions: Secondary | ICD-10-CM | POA: Diagnosis not present

## 2018-09-05 DIAGNOSIS — A09 Infectious gastroenteritis and colitis, unspecified: Secondary | ICD-10-CM

## 2018-09-05 DIAGNOSIS — Z09 Encounter for follow-up examination after completed treatment for conditions other than malignant neoplasm: Secondary | ICD-10-CM

## 2018-09-05 DIAGNOSIS — K921 Melena: Secondary | ICD-10-CM

## 2018-09-05 DIAGNOSIS — D72829 Elevated white blood cell count, unspecified: Secondary | ICD-10-CM

## 2018-09-05 DIAGNOSIS — E860 Dehydration: Secondary | ICD-10-CM

## 2018-09-05 LAB — OVA AND PARASITE EXAMINATION

## 2018-09-05 NOTE — Progress Notes (Signed)
Subjective:     Patient ID: [name] , female    DOB: [birth year] , 77 y.o.   MRN: [id number]  CC- FU diarrhea and dehydration HPI Pt is here for FU diarrhea, dehydration and stool studies. She feels a little weak, but much better. Has mild LLQ pain, not is not any worse. Has been eating soup since yesterday. Has had one formed stool yesterday, which was black as told since she is taking the Pepto. Has not seen any more bright blood. She would like to have    Past Medical History:  Diagnosis Date  . Abdominal pain   . Arthritis    cervical disc degeneration/ oa left knee, carpal tunnel rt wrist, adhesive capsulitis right shoulder, rt hand weakness; lumbar degeneration  . Carpal tunnel syndrome   . Colon adenoma   . 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 Constipation   . Diabetes mellitus without complication ([place])    borderline - diet control  . Diverticulitis hx of  . Diverticulosis   . Frequent UTI    hx of urethral injury during colon surgery - states frequent uti's since  . GERD (gastroesophageal reflux disease)   . H/O hiatal hernia   . History of palpitations    in the past  . History of shingles    has a lingering itching on back where shingles were  . Hyperlipidemia   . Hypertension   . Numbness and tingling in right hand    pt. states has numbness of right hand very frequently-watch positioning  . Obesity   . Osteoarthritis   . Osteoporosis   . Pain    pain left knee and pain right hip and right groin  . Personal history of colonic polyps-adenoma 08/26/2008  . Pneumonia 2005  . Vitamin D deficiency      Family History  Problem Relation Age of Onset  . Breast cancer Mother 67  . Hypertension Mother   . Prostate cancer Father   . Colon cancer Father 57  . Hypertension Father   .  Dementia Sister   . Lung cancer Brother   . Hypertension Brother   . COPD Brother   . Cancer Maternal Grandmother   . Arthritis Sister   . Hypertension Sister   . Arthritis Sister   . Hypertension Child   . Hypertension Child   . Hypertension Child      Current Outpatient Medications:  .  allopurinol (ZYLOPRIM) 300 MG tablet, Take 300 mg by mouth daily., Disp: , Rfl:  .  amLODipine-benazepril (LOTREL) 5-10 MG capsule, Take 1 capsule by mouth daily., Disp: 90 capsule, Rfl: 1 .  aspirin EC 81 MG tablet, Take 81 mg by mouth daily., Disp: , Rfl:  .  atenolol-chlorthalidone (TENORETIC) 50-25 MG per tablet, Take 1 tablet by mouth daily. @@ 11 am, Disp: 30 tablet, Rfl: 0 .  ciprofloxacin (CIPRO) 500 MG tablet, Take 1 tablet (500 mg total) by mouth 2 (two) times daily for 3 days., Disp: 6 tablet, Rfl: 0 .  famotidine (PEPCID) 20 MG tablet, Take 20 mg by mouth daily as needed for heartburn or indigestion. , Disp: , Rfl:  .  hydrocortisone cream 1 %, Apply to affected area 2 times daily, Disp: 30 g, Rfl: 1 .  magnesium oxide (MAG-OX) 400 MG tablet, Take  400 mg by mouth daily. 1 tablet daily, Disp: , Rfl:  .  MELATONIN PO, Take 2 tablets by mouth at bedtime as needed (For sleep.)., Disp: , Rfl:  .  Multiple Vitamin (MULTIVITAMIN) capsule, Take 1 capsule by mouth daily., Disp: , Rfl:  .  Multiple Vitamins-Minerals (IMMUNE SUPPORT PO), Take by mouth., Disp: , Rfl:  .  ONETOUCH DELICA LANCETS 21J MISC, Use to check blood glucose once daily, Disp: 100 each, Rfl: 3 .  Probiotic Product (PROBIOTIC DAILY PO), Take 3 capsules by mouth daily., Disp: , Rfl:  .  rosuvastatin (CRESTOR) 20 MG tablet, Take 1 tablet (20 mg total) by mouth daily., Disp: 90 tablet, Rfl: 3 .  TRULICITY 9.41 DE/0.8XK SOPN, INJECT 0.5 ML(0.75 MG) SUBCUTANEOUSLY EVERY WEEK IN ABDOMEN, THIGH OR UPPER ARM ROTATING INJECTION SITE, Disp: 2 mL, Rfl: 0   Allergies  Allergen Reactions  . Atorvastatin Calcium Itching    Other reaction(s):  Itching  . Bee Venom Hives  . Lipitor [Atorvastatin Calcium] Itching  . Oxycodone Other (See Comments)    hallucinations  . Vicodin [Hydrocodone-Acetaminophen] Other (See Comments)    hallucinations     Review of Systems  Constitutional: Positive for fatigue. Negative for chills, diaphoresis and fever.  Respiratory: Negative for chest tightness and shortness of breath.   Cardiovascular: Negative for chest pain.  Gastrointestinal: Positive for abdominal pain. Negative for abdominal distention, anal bleeding, blood in stool, constipation, diarrhea, nausea and rectal pain.       Has mild LLQ pain still  Genitourinary: Negative for dysuria and frequency.  Musculoskeletal: Negative for gait problem.  Skin: Negative for rash.  Neurological: Positive for weakness. Negative for dizziness, light-headedness and headaches.       Still slightly weak     Today's Vitals   09/05/18 1626 09/05/18 1630 09/05/18 1631 09/05/18 1634  BP: 140/80     Pulse: 77     Temp: 98.3 F (36.8 C)     TempSrc: Oral     SpO2: 94% 96% 93% 98%  Weight: 246 lb (111.6 kg)     Height: 5\' 5"  (1.651 m)      Body mass index is 40.94 kg/m.   Objective:  Physical Exam  Constitutional: She is oriented to person, place, and time. She appears well-developed and well-nourished. No distress.  She looks so much better than yesterday  HENT:  Head: Normocephalic.  Right Ear: External ear normal.  Left Ear: External ear normal.  Nose: Nose normal.  Eyes: Conjunctivae are normal. Right eye exhibits no discharge. Left eye exhibits no discharge. No scleral icterus.  Neck: Neck supple.  Cardiovascular: Normal rate and regular rhythm.  Pulmonary/Chest: Effort normal.  Abdominal: Soft. Bowel sounds are normal. She exhibits no distension and no mass. There is tenderness. There is no rebound and no guarding.  Has mild tenderness on LLQ and L mid abd  Lymphadenopathy:    She has no cervical adenopathy.  Neurological: She is  alert and oriented to person, place, and time.  Skin: No rash noted. She is not diaphoretic.  Psychiatric: She has a normal mood and affect. Her behavior is normal. Judgment and thought content normal.  Nursing note and vitals reviewed.   Assessment And Plan:     1. Diarrhea of infectious origin- due to E coli per culture. Salmonella and campylobacter cultures are pending.  2- Dehydration improved 3- blood in stool resolved.  4- leukocytoisis- CBC with diff ordered.  5- abnormal kidney function- CMP ordered.  She was advised to d/c the pepto and start advancing slowly to regular diet.  We will call her when the other stool culture results are back.  FU in 3 months for DM with Dr Baird Cancer.   Dailyn Reith RODRIGUEZ-SOUTHWORTH, PA-C

## 2018-09-05 NOTE — Patient Instructions (Signed)
E. Coli Infection E. coli (Escherichia coli) are bacteria that can cause an infection in various parts of your body, including your intestines. E. coli bacteria normally live in the intestines of people and animals. Most types of E. coli do not cause infections, but some produce a poison (toxin) that can cause diarrhea. Depending on the toxin, this can cause mild or severe diarrhea. This condition can spread from person to person (is contagious). Toxin-producing E. coli can also spread from animals to humans. Most cases of E. coli infection come from cows (cattle). In some cases, this infection can cause a dangerous complication called hemolytic uremic syndrome (HUS). What are the causes? Causes of an E. coli intestinal infection include:  Eating raw or undercooked beef from infected cattle.  Touching an infected animal and then touching your mouth.  Eating fruits or vegetables that have come in contact with the feces of infected animals.  Drinking fluids that have been contaminated with E. coli from infected animals.  Coming into contact with a surface that has been contaminated by an infected person.  What increases the risk? This condition is more likely to develop in people who:  Eat raw or undercooked beef.  Drink raw (unpasteurized) milk, cider, or juice.  Are in close contact with cattle, goats, or sheep.  What are the signs or symptoms? Symptoms of this condition usually start about 3-4 days after the bacteria are swallowed (ingested). Symptoms include:  Severe cramps and tenderness in the abdomen.  Diarrhea. This may be watery or bloody.  Nausea and vomiting.  Dehydration. Dehydration can cause fatigue, thirst, a dry mouth, and less frequent urination.  Low fever. This is not common.  How is this diagnosed? This condition is diagnosed with a medical history and physical exam. A stool sample may be taken and tested for E. coli or toxins of E. coli. How is this  treated? Treatment for this condition includes rest and fluids (supportive care). If you have severe diarrhea, you may need to receive fluids through an IV tube. Symptoms of E. coli intestinal infection usually go away in 5-10 days. Some strains of E. coli may be treated with antibiotic or antidiarrheal medicines. However, this treatment is rarely used because these medicines may increase your risk for HUS. Follow these instructions at home: Eating and drinking  Drink enough fluids to keep your urine clear or pale yellow. You may need to drink small amounts of clear liquids frequently.  Ask your health care provider for specific rehydration instructions.  Do not drink milk, caffeine, or alcohol.  Eat small, frequent meals rather than large meals. General instructions  Take over-the-counter and prescription medicines only as told by your health care provider.  Wash your hands thoroughly before and after you prepare food and after you go to the bathroom (use the toilet). Make sure people who live with you also wash their hands often. If soap and water are not available, use hand sanitizer.  Clean surfaces that you touch with a product that contains chlorine bleach.  Keep all follow-up visits as told by your health care provider. This is important. How is this prevented?  Wash your hands often with soap and water. If soap and water are not available, use hand sanitizer. Always wash your hands: ? After you go to the bathroom. ? Before you touch food. ? After you prepare or cook beef. ? After you touch animals at farms, zoos, or fairs.  Do not eat raw or undercooked beef.  Do not drink unpasteurized milk or eat cheeses that were made with raw milk. Do not drink unpasteurized apple cider.  Wash cutting boards, counters, and utensils after you prepare raw meat.  Wash all fruits and vegetables before you eat or cook them. Contact a health care provider if:  Your symptoms do not get  better with treatment.  You have new symptoms.  Your vomiting or diarrhea gets worse. Get help right away if:  You have increasing pain or tenderness in your abdomen.  You have ongoing (persistent) vomiting or diarrhea.  You have abdominal pain that stays in one area (localizes).  Your diarrhea has more blood in it.  You have a fever.  You cannot eat or drink without vomiting.  You have signs of dehydration or HUS, such as: ? Pale skin. ? Dark urine, very little urine, or no urine. ? Cracked lips. ? Not making tears while crying. ? Dry mouth. ? Sunken eyes. ? Sleepiness. ? Weakness. ? Dizziness. This information is not intended to replace advice given to you by your health care provider. Make sure you discuss any questions you have with your health care provider. Document Released: 07/12/2005 Document Revised: 03/23/2016 Document Reviewed: 04/19/2015 Elsevier Interactive Patient Education  [employer].

## 2018-09-06 LAB — CMP14 + ANION GAP
ALT: 29 IU/L (ref 0–32)
AST: 50 IU/L — ABNORMAL HIGH (ref 0–40)
Albumin/Globulin Ratio: 1.3 (ref 1.2–2.2)
Albumin: 3.9 g/dL (ref 3.5–4.8)
Alkaline Phosphatase: 52 IU/L (ref 39–117)
Anion Gap: 22 mmol/L — ABNORMAL HIGH (ref 10.0–18.0)
BUN/Creatinine Ratio: 14 (ref 12–28)
BUN: 18 mg/dL (ref 8–27)
Bilirubin Total: 0.8 mg/dL (ref 0.0–1.2)
CO2: 19 mmol/L — ABNORMAL LOW (ref 20–29)
Calcium: 9.3 mg/dL (ref 8.7–10.3)
Chloride: 102 mmol/L (ref 96–106)
Creatinine, Ser: 1.29 mg/dL — ABNORMAL HIGH (ref 0.57–1.00)
GFR calc Af Amer: 46 mL/min/{1.73_m2} — ABNORMAL LOW (ref >59)
GFR calc non Af Amer: 40 mL/min/{1.73_m2} — ABNORMAL LOW (ref >59)
Globulin, Total: 2.9 g/dL (ref 1.5–4.5)
Glucose: 115 mg/dL — ABNORMAL HIGH (ref 65–99)
Potassium: 4.6 mmol/L (ref 3.5–5.2)
Sodium: 143 mmol/L (ref 134–144)
Total Protein: 6.8 g/dL (ref 6.0–8.5)

## 2018-09-06 LAB — CBC WITH DIFFERENTIAL/PLATELET
Basophils Absolute: 0.1 10*3/uL (ref 0.0–0.2)
Basos: 1 %
EOS (ABSOLUTE): 0.3 10*3/uL (ref 0.0–0.4)
Eos: 3 %
Hematocrit: 41.9 % (ref 34.0–46.6)
Hemoglobin: 14.3 g/dL (ref 11.1–15.9)
Immature Grans (Abs): 0 10*3/uL (ref 0.0–0.1)
Immature Granulocytes: 0 %
Lymphocytes Absolute: 3.3 10*3/uL — ABNORMAL HIGH (ref 0.7–3.1)
Lymphs: 34 %
MCH: 30.3 pg (ref 26.6–33.0)
MCHC: 34.1 g/dL (ref 31.5–35.7)
MCV: 89 fL (ref 79–97)
Monocytes Absolute: 0.8 10*3/uL (ref 0.1–0.9)
Monocytes: 8 %
Neutrophils Absolute: 5.2 10*3/uL (ref 1.4–7.0)
Neutrophils: 54 %
Platelets: 251 10*3/uL (ref 150–450)
RBC: 4.72 x10E6/uL (ref 3.77–5.28)
RDW: 13.3 % (ref 12.3–15.4)
WBC: 9.7 10*3/uL (ref 3.4–10.8)

## 2018-09-07 LAB — STOOL CULTURE: E coli, Shiga toxin Assay: POSITIVE — AB

## 2018-09-25 LAB — STATE LABORATORY REPORT

## 2018-10-08 ENCOUNTER — Encounter: Payer: Self-pay | Admitting: Internal Medicine

## 2018-10-08 ENCOUNTER — Ambulatory Visit (INDEPENDENT_AMBULATORY_CARE_PROVIDER_SITE_OTHER): Payer: Medicare Other | Admitting: Internal Medicine

## 2018-10-08 VITALS — BP 120/70 | HR 89 | Temp 98.2°F | Ht 65.0 in | Wt 245.0 lb

## 2018-10-08 DIAGNOSIS — Z7982 Long term (current) use of aspirin: Secondary | ICD-10-CM | POA: Diagnosis not present

## 2018-10-08 DIAGNOSIS — Z23 Encounter for immunization: Secondary | ICD-10-CM

## 2018-10-08 DIAGNOSIS — I10 Essential (primary) hypertension: Secondary | ICD-10-CM | POA: Diagnosis not present

## 2018-10-08 DIAGNOSIS — E1165 Type 2 diabetes mellitus with hyperglycemia: Secondary | ICD-10-CM

## 2018-10-08 MED ORDER — ATENOLOL-CHLORTHALIDONE 50-25 MG PO TABS: 1.0000 | ORAL_TABLET | Freq: Every day | ORAL | 0 refills | Status: DC

## 2018-10-08 MED ORDER — ROSUVASTATIN CALCIUM 20 MG PO TABS: 20.0000 mg | ORAL_TABLET | Freq: Every day | ORAL | 0 refills | Status: DC

## 2018-10-08 MED ORDER — ALLOPURINOL 300 MG PO TABS: 300.0000 mg | ORAL_TABLET | Freq: Every day | ORAL | 0 refills | Status: DC

## 2018-10-08 NOTE — Patient Instructions (Signed)
Try a natural Magnesium powder  and take it every night to help with resting better, and hard stools. Its called C.A.L.M.

## 2018-10-08 NOTE — Progress Notes (Signed)
Subjective:     Patient ID: [name] , female    DOB: 06-23-1941 , 77 y.o.   MRN: [id number]   Chief Complaint  Patient presents with  . Diabetes    HPI  Pt is here for DM FU. Average  gluvose 119, highest 157 1 h afte eating. Does not exercise. Her plan to start exercising 3 times a week.  Bps has been  120/70-139/70. Only had a few episodes of hypoglycemia when she was sick with diarrhea. She colon is back to normal. Denies having any edema since she is drinking blends of fruits and vegetables.   Past Medical History:  Diagnosis Date  . Abdominal pain   . Arthritis    cervical disc degeneration/ oa left knee, carpal tunnel rt wrist, adhesive capsulitis right shoulder, rt hand weakness; lumbar degeneration  . Carpal tunnel syndrome   . Colon adenoma   . 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 Constipation   . Diabetes mellitus without complication ([place])    borderline - diet control  . Diverticulitis hx of  . Diverticulosis   . Frequent UTI    hx of urethral injury during colon surgery - states frequent uti's since  . GERD (gastroesophageal reflux disease)   . H/O hiatal hernia   . History of palpitations    in the past  . History of shingles    has a lingering itching on back where shingles were  . Hyperlipidemia   . Hypertension   . Numbness and tingling in right hand    pt. states has numbness of right hand very frequently-watch positioning  . Obesity   . Osteoarthritis   . Osteoporosis   . Pain    pain left knee and pain right hip and right groin  . Personal history of colonic polyps-adenoma 08/26/2008  . Pneumonia 2005  . Vitamin D deficiency      Family History  Problem Relation Age of Onset  . Breast cancer Mother 53  . Hypertension Mother   . Prostate cancer Father   . Colon  cancer Father 61  . Hypertension Father   . Dementia Sister   . Lung cancer Brother   . Hypertension Brother   . COPD Brother   . Cancer Maternal Grandmother   . Arthritis Sister   . Hypertension Sister   . Arthritis Sister   . Hypertension Child   . Hypertension Child   . Hypertension Child      Current Outpatient Medications:  .  allopurinol (ZYLOPRIM) 300 MG tablet, Take 300 mg by mouth daily., Disp: , Rfl:  .  amLODipine-benazepril (LOTREL) 5-10 MG capsule, Take 1 capsule by mouth daily., Disp: 90 capsule, Rfl: 1 .  aspirin EC 81 MG tablet, Take 81 mg by mouth daily., Disp: , Rfl:  .  atenolol-chlorthalidone (TENORETIC) 50-25 MG per tablet, Take 1 tablet by mouth daily. @@ 11 am, Disp: 30 tablet, Rfl: 0 .  famotidine (PEPCID) 20 MG tablet, Take 20 mg by mouth daily as needed for heartburn or indigestion. , Disp: , Rfl:  .  hydrocortisone cream 1 %, Apply to affected area 2 times daily, Disp: 30 g, Rfl: 1 .  magnesium oxide (MAG-OX) 400 MG tablet, Take 400 mg by mouth daily. 1 tablet daily, Disp: , Rfl:  .  MELATONIN  PO, Take 2 tablets by mouth at bedtime as needed (For sleep.)., Disp: , Rfl:  .  Multiple Vitamin (MULTIVITAMIN) capsule, Take 1 capsule by mouth daily., Disp: , Rfl:  .  Multiple Vitamins-Minerals (IMMUNE SUPPORT PO), Take by mouth., Disp: , Rfl:  .  ONETOUCH DELICA LANCETS 94I MISC, Use to check blood glucose once daily, Disp: 100 each, Rfl: 3 .  Probiotic Product (PROBIOTIC DAILY PO), Take 3 capsules by mouth daily., Disp: , Rfl:  .  rosuvastatin (CRESTOR) 20 MG tablet, Take 1 tablet (20 mg total) by mouth daily., Disp: 90 tablet, Rfl: 3 .  TRULICITY 0.16 PV/3.7SM SOPN, INJECT 0.5 ML(0.75 MG) SUBCUTANEOUSLY EVERY WEEK IN ABDOMEN, THIGH OR UPPER ARM ROTATING INJECTION SITE, Disp: 2 mL, Rfl: 0   Allergies  Allergen Reactions  . Atorvastatin Calcium Itching    Other reaction(s): Itching  . Bee Venom Hives  . Lipitor [Atorvastatin Calcium] Itching  . Oxycodone  Other (See Comments)    hallucinations  . Vicodin [Hydrocodone-Acetaminophen] Other (See Comments)    hallucinations     Review of Systems  Constitutional: Negative for diaphoresis.  HENT: Negative for tinnitus.   Respiratory: Negative for chest tightness and shortness of breath.   Cardiovascular: Negative for chest pain, palpitations and leg swelling.  Genitourinary: Positive for frequency. Negative for dysuria.       Due to drinking a lot and med. Has urethra protrusion since diarrhea  Skin: Negative for wound.  Neurological: Negative for numbness and headaches.     Today's Vitals   10/08/18 1132  BP: 120/70  Pulse: 89  Temp: 98.2 F (36.8 C)  TempSrc: Oral  SpO2: 97%  Weight: 245 lb (111.1 kg)  Height: 5\' 5"  (1.651 m)   Body mass index is 40.77 kg/m.   Objective:  Physical Exam   Constitutional: She is oriented to person, place, and time. She appears well-developed and well-nourished. No distress.  HENT:  Head: Normocephalic and atraumatic.  Right Ear: External ear normal.  Left Ear: External ear normal.  Nose: Nose normal.  Eyes: Conjunctivae are normal. Right eye exhibits no discharge. Left eye exhibits no discharge. No scleral icterus.  Neck: Neck supple. No thyromegaly present.  No carotid bruits bilaterally  Cardiovascular: Normal rate and regular rhythm.  Pulmonary/Chest: Effort normal and breath sounds normal. No respiratory distress.  Musculoskeletal: Normal range of motion. She exhibits no edema.  Lymphadenopathy:    She has no cervical adenopathy.  Neurological: She is alert and oriented to person, place, and time.  Skin: Skin is warm and dry. Capillary refill takes less than 2 seconds. No rash noted. She is not diaphoretic.  Psychiatric: She has a normal mood and affect. Her behavior is normal. Judgment and thought content normal.  Nursing note reviewed.  Assessment And Plan:    1. Uncontrolled type 2 diabetes mellitus with hyperglycemia ([place])-  chronic. If HGBA is abnormal, then we will make some changes to her medication - Hemoglobin A1c  2. Essential hypertension- stable. May continue same med. - CMP14 + Anion Gap - CBC no Diff  3. Need for influenza vaccination - Flu vaccine HIGH DOSE PF (Fluzone High dose)  FU 3 months.  Lissa Rowles RODRIGUEZ-SOUTHWORTH, PA-C

## 2018-10-09 LAB — HEMOGLOBIN A1C
Est. average glucose Bld gHb Est-mCnc: 140 mg/dL
Hgb A1c MFr Bld: 6.5 % — ABNORMAL HIGH (ref 4.8–5.6)

## 2018-10-09 LAB — CBC
Hematocrit: 42.3 % (ref 34.0–46.6)
Hemoglobin: 14.1 g/dL (ref 11.1–15.9)
MCH: 29.2 pg (ref 26.6–33.0)
MCHC: 33.3 g/dL (ref 31.5–35.7)
MCV: 88 fL (ref 79–97)
Platelets: 230 10*3/uL (ref 150–450)
RBC: 4.83 x10E6/uL (ref 3.77–5.28)
RDW: 13.5 % (ref 12.3–15.4)
WBC: 8.9 10*3/uL (ref 3.4–10.8)

## 2018-10-09 LAB — CMP14 + ANION GAP
ALT: 24 IU/L (ref 0–32)
AST: 29 IU/L (ref 0–40)
Albumin/Globulin Ratio: 1.8 (ref 1.2–2.2)
Albumin: 4.1 g/dL (ref 3.5–4.8)
Alkaline Phosphatase: 52 IU/L (ref 39–117)
Anion Gap: 16 mmol/L (ref 10.0–18.0)
BUN/Creatinine Ratio: 17 (ref 12–28)
BUN: 19 mg/dL (ref 8–27)
Bilirubin Total: 0.7 mg/dL (ref 0.0–1.2)
CO2: 25 mmol/L (ref 20–29)
Calcium: 9.7 mg/dL (ref 8.7–10.3)
Chloride: 100 mmol/L (ref 96–106)
Creatinine, Ser: 1.13 mg/dL — ABNORMAL HIGH (ref 0.57–1.00)
GFR calc Af Amer: 55 mL/min/{1.73_m2} — ABNORMAL LOW (ref >59)
GFR calc non Af Amer: 47 mL/min/{1.73_m2} — ABNORMAL LOW (ref >59)
Globulin, Total: 2.3 g/dL (ref 1.5–4.5)
Glucose: 119 mg/dL — ABNORMAL HIGH (ref 65–99)
Potassium: 3.6 mmol/L (ref 3.5–5.2)
Sodium: 141 mmol/L (ref 134–144)
Total Protein: 6.4 g/dL (ref 6.0–8.5)

## 2018-10-10 ENCOUNTER — Encounter: Payer: Self-pay | Admitting: Internal Medicine

## 2018-11-21 ENCOUNTER — Telehealth: Payer: Self-pay

## 2018-11-21 NOTE — Telephone Encounter (Signed)
Spoke with pt and informed her that a sample of Trulicity was ready for her to pick up, she verbalized understanding.

## 2018-11-28 ENCOUNTER — Other Ambulatory Visit: Payer: Self-pay | Admitting: Internal Medicine

## 2018-12-04 ENCOUNTER — Other Ambulatory Visit: Payer: Self-pay | Admitting: Internal Medicine

## 2018-12-10 ENCOUNTER — Encounter: Payer: Self-pay | Admitting: Internal Medicine

## 2018-12-10 ENCOUNTER — Ambulatory Visit: Payer: Medicare Other | Admitting: Internal Medicine

## 2018-12-10 ENCOUNTER — Ambulatory Visit (INDEPENDENT_AMBULATORY_CARE_PROVIDER_SITE_OTHER): Payer: Medicare Other | Admitting: Internal Medicine

## 2018-12-10 VITALS — BP 136/88 | HR 60 | Temp 98.0°F | Ht 65.4 in | Wt 250.8 lb

## 2018-12-10 DIAGNOSIS — E661 Drug-induced obesity: Secondary | ICD-10-CM

## 2018-12-10 DIAGNOSIS — E1121 Type 2 diabetes mellitus with diabetic nephropathy: Secondary | ICD-10-CM

## 2018-12-10 DIAGNOSIS — I10 Essential (primary) hypertension: Secondary | ICD-10-CM

## 2018-12-10 DIAGNOSIS — Z6841 Body Mass Index (BMI) 40.0 and over, adult: Secondary | ICD-10-CM

## 2018-12-10 DIAGNOSIS — E1165 Type 2 diabetes mellitus with hyperglycemia: Secondary | ICD-10-CM

## 2018-12-10 DIAGNOSIS — E7800 Pure hypercholesterolemia, unspecified: Secondary | ICD-10-CM | POA: Diagnosis not present

## 2018-12-10 NOTE — Progress Notes (Signed)
Subjective:     Patient ID: [name] , female    DOB: 05-07-1941 , 78 y.o.   MRN: [id number]   Chief Complaint  Patient presents with  . Hypertension  . Diabetes    HPI  She is here for HTN and diabetes.  Her BP has been 120/70's, and pulse 63-65. Unless she eats pork, then it goes to 130's/ upper 80's Her glucose has been doing well. 85-120. She does feel little weak today. She ate around 10 am sausage link, toast and potatoes.   Past Medical History:  Diagnosis Date  . Abdominal pain   . Arthritis    cervical disc degeneration/ oa left knee, carpal tunnel rt wrist, adhesive capsulitis right shoulder, rt hand weakness; lumbar degeneration  . Carpal tunnel syndrome   . Colon adenoma   . 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 Constipation   . Diabetes mellitus without complication ([place])    borderline - diet control  . Diverticulitis hx of  . Diverticulosis   . Frequent UTI    hx of urethral injury during colon surgery - states frequent uti's since  . GERD (gastroesophageal reflux disease)   . H/O hiatal hernia   . History of palpitations    in the past  . History of shingles    has a lingering itching on back where shingles were  . Hyperlipidemia   . Hypertension   . Numbness and tingling in right hand    pt. states has numbness of right hand very frequently-watch positioning  . Obesity   . Osteoarthritis   . Osteoporosis   . Pain    pain left knee and pain right hip and right groin  . Personal history of colonic polyps-adenoma 08/26/2008  . Pneumonia 2005  . Vitamin D deficiency      Family History  Problem Relation Age of Onset  . Breast cancer Mother 44  . Hypertension Mother   . Prostate cancer Father   . Colon cancer Father 78  . Hypertension Father   . Dementia Sister   .  Lung cancer Brother   . Hypertension Brother   . COPD Brother   . Cancer Maternal Grandmother   . Arthritis Sister   . Hypertension Sister   . Arthritis Sister   . Hypertension Child   . Hypertension Child   . Hypertension Child      Current Outpatient Medications:  .  allopurinol (ZYLOPRIM) 300 MG tablet, Take 1 tablet (300 mg total) by mouth daily., Disp: 90 tablet, Rfl: 0 .  amLODipine-benazepril (LOTREL) 5-10 MG capsule, Take 1 capsule by mouth daily., Disp: 90 capsule, Rfl: 1 .  aspirin EC 81 MG tablet, Take 81 mg by mouth daily., Disp: , Rfl:  .  atenolol-chlorthalidone (TENORETIC) 50-25 MG tablet, TAKE 1 ATBLET BY MOUTH DAILY AT 11 AM, Disp: 90 tablet, Rfl: 0 .  MELATONIN PO, Take 2 tablets by mouth at bedtime as needed (For sleep.)., Disp: , Rfl:  .  ONETOUCH DELICA LANCETS 78G MISC, Use to check blood glucose once daily, Disp: 100 each, Rfl: 3 .  Probiotic Product (PROBIOTIC DAILY PO), Take 3 capsules by mouth daily., Disp: , Rfl:  .  rosuvastatin (CRESTOR) 20 MG tablet, TAKE 1 TABLET BY MOUTH ONCE DAILY, Disp: 90 tablet, Rfl: 0 .  TRULICITY 2.20 UR/4.2HC SOPN, INJECT 0.5 ML(0.75 MG) SUBCUTANEOUSLY EVERY WEEK IN ABDOMEN, THIGH OR UPPER ARM ROTATING INJECTION SITE, Disp: 2 mL, Rfl: 0   Allergies  Allergen Reactions  . Atorvastatin Calcium Itching    Other reaction(s): Itching  . Bee Venom Hives  . Lipitor [Atorvastatin Calcium] Itching  . Oxycodone Other (See Comments)    hallucinations  . Vicodin [Hydrocodone-Acetaminophen] Other (See Comments)    hallucinations     Review of Systems  Constitutional: Negative for chills, diaphoresis and fever.  HENT: Positive for congestion.        Has slight stuffiness on R side and O.J. and rob DM has helped this.   Eyes: Positive for redness.       Noticed this am on the R uter sclera. Denies eye pain or tearing.  Respiratory: Negative for cough, chest tightness and shortness of breath.   Cardiovascular: Negative for chest pain,  palpitations and leg swelling.  Gastrointestinal: Negative for constipation, diarrhea, nausea and vomiting.  Endocrine: Positive for cold intolerance. Negative for polydipsia and polyphagia.  Musculoskeletal: Positive for neck stiffness. Negative for neck pain.  Skin: Negative for rash and wound.  Neurological: Negative for dizziness, numbness and headaches.     Today's Vitals   12/10/18 1446  BP: 136/88  Pulse: 60  Temp: 98 F (36.7 C)  TempSrc: Oral  SpO2: 97%  Weight: 250 lb 12.8 oz (113.8 kg)  Height: 5' 5.4" (1.661 m)  PainSc: 0-No pain   Body mass index is 41.23 kg/m.   Objective:  Physical Exam   Constitutional: She is oriented to person, place, and time. She appears well-developed and well-nourished. No distress.  HENT:  Head: Normocephalic and atraumatic.  Right Ear: External ear normal.  Left Ear: External ear normal.  Nose: Nose normal.  Eyes: Conjunctivae are normal. Right eye exhibits no discharge. Left eye exhibits no discharge. No scleral icterus.  Neck: Neck supple. No thyromegaly present.  No carotid bruits bilaterally  Cardiovascular: Normal rate and regular rhythm.  No murmur heard. Pulmonary/Chest: Effort normal and breath sounds normal. No respiratory distress.  Musculoskeletal: Normal range of motion. She exhibits no edema.  Lymphadenopathy: She has no cervical adenopathy.  Neurological: She is alert and oriented to person, place, and time.  Skin: Skin is warm and dry. Capillary refill takes less than 2 seconds. No rash noted. She is not diaphoretic.  Psychiatric: She has a normal mood and affect. Her behavior is normal. Judgment and thought content normal.  Nursing note reviewed. Assessment And Plan:  1. Uncontrolled type 2 diabetes mellitus with hyperglycemia ([place])- chronic. Long time spend discussing foods she needs to avoid and the importance of weight loss to reverse her DM which she has done in the past.  - Hemoglobin A1c  3. Essential  hypertension- chronic. May continue same meds.  - CMP14 + Anion Gap - CBC no Diff  4. Pure hypercholesterolemia- chronic - Lipid Profile  5. 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 She is interested in a weight loss program.  Advised to walk at least 30 min a day, even if in 10 min increments.  - Amb Ref to Medical Weight Management   FU in 3 months.   Denijah Karrer RODRIGUEZ-SOUTHWORTH, PA-C

## 2018-12-10 NOTE — Patient Instructions (Signed)
Diabetes Mellitus and Nutrition, Adult  When you have diabetes (diabetes mellitus), it is very important to have healthy eating habits because your blood sugar (glucose) levels are greatly affected by what you eat and drink. Eating healthy foods in the appropriate amounts, at about the same times every day, can help you:  · Control your blood glucose.  · Lower your risk of heart disease.  · Improve your blood pressure.  · Reach or maintain a healthy weight.  Every person with diabetes is different, and each person has different needs for a meal plan. Your health care provider may recommend that you work with a diet and nutrition specialist (dietitian) to make a meal plan that is best for you. Your meal plan may vary depending on factors such as:  · The calories you need.  · The medicines you take.  · Your weight.  · Your blood glucose, blood pressure, and cholesterol levels.  · Your activity level.  · Other health conditions you have, such as heart or kidney disease.  How do carbohydrates affect me?  Carbohydrates, also called carbs, affect your blood glucose level more than any other type of food. Eating carbs naturally raises the amount of glucose in your blood. Carb counting is a method for keeping track of how many carbs you eat. Counting carbs is important to keep your blood glucose at a healthy level, especially if you use insulin or take certain oral diabetes medicines.  It is important to know how many carbs you can safely have in each meal. This is different for every person. Your dietitian can help you calculate how many carbs you should have at each meal and for each snack.  Foods that contain carbs include:  · Bread, cereal, rice, pasta, and crackers.  · Potatoes and corn.  · Peas, beans, and lentils.  · Milk and yogurt.  · Fruit and juice.  · Desserts, such as cakes, cookies, ice cream, and candy.  How does alcohol affect me?  Alcohol can cause a sudden decrease in blood glucose (hypoglycemia),  especially if you use insulin or take certain oral diabetes medicines. Hypoglycemia can be a life-threatening condition. Symptoms of hypoglycemia (sleepiness, dizziness, and confusion) are similar to symptoms of having too much alcohol.  If your health care provider says that alcohol is safe for you, follow these guidelines:  · Limit alcohol intake to no more than 1 drink per day for nonpregnant women and 2 drinks per day for men. One drink equals 12 oz of beer, 5 oz of wine, or 1½ oz of hard liquor.  · Do not drink on an empty stomach.  · Keep yourself hydrated with water, diet soda, or unsweetened iced tea.  · Keep in mind that regular soda, juice, and other mixers may contain a lot of sugar and must be counted as carbs.  What are tips for following this plan?    Reading food labels  · Start by checking the serving size on the "Nutrition Facts" label of packaged foods and drinks. The amount of calories, carbs, fats, and other nutrients listed on the label is based on one serving of the item. Many items contain more than one serving per package.  · Check the total grams (g) of carbs in one serving. You can calculate the number of servings of carbs in one serving by dividing the total carbs by 15. For example, if a food has 30 g of total carbs, it would be equal to 2   servings of carbs.  · Check the number of grams (g) of saturated and trans fats in one serving. Choose foods that have low or no amount of these fats.  · Check the number of milligrams (mg) of salt (sodium) in one serving. Most people should limit total sodium intake to less than 2,300 mg per day.  · Always check the nutrition information of foods labeled as "low-fat" or "nonfat". These foods may be higher in added sugar or refined carbs and should be avoided.  · Talk to your dietitian to identify your daily goals for nutrients listed on the label.  Shopping  · Avoid buying canned, premade, or processed foods. These foods tend to be high in fat, sodium,  and added sugar.  · Shop around the outside edge of the grocery store. This includes fresh fruits and vegetables, bulk grains, fresh meats, and fresh dairy.  Cooking  · Use low-heat cooking methods, such as baking, instead of high-heat cooking methods like deep frying.  · Cook using healthy oils, such as olive, canola, or sunflower oil.  · Avoid cooking with butter, cream, or high-fat meats.  Meal planning  · Eat meals and snacks regularly, preferably at the same times every day. Avoid going long periods of time without eating.  · Eat foods high in fiber, such as fresh fruits, vegetables, beans, and whole grains. Talk to your dietitian about how many servings of carbs you can eat at each meal.  · Eat 4-6 ounces (oz) of lean protein each day, such as lean meat, chicken, fish, eggs, or tofu. One oz of lean protein is equal to:  ? 1 oz of meat, chicken, or fish.  ? 1 egg.  ? ¼ cup of tofu.  · Eat some foods each day that contain healthy fats, such as avocado, nuts, seeds, and fish.  Lifestyle  · Check your blood glucose regularly.  · Exercise regularly as told by your health care provider. This may include:  ? 150 minutes of moderate-intensity or vigorous-intensity exercise each week. This could be brisk walking, biking, or water aerobics.  ? Stretching and doing strength exercises, such as yoga or weightlifting, at least 2 times a week.  · Take medicines as told by your health care provider.  · Do not use any products that contain nicotine or tobacco, such as cigarettes and e-cigarettes. If you need help quitting, ask your health care provider.  · Work with a counselor or diabetes educator to identify strategies to manage stress and any emotional and social challenges.  Questions to ask a health care provider  · Do I need to meet with a diabetes educator?  · Do I need to meet with a dietitian?  · What number can I call if I have questions?  · When are the best times to check my blood glucose?  Where to find more  information:  · American Diabetes Association: diabetes.org  · Academy of Nutrition and Dietetics: www.eatright.org  · National Institute of Diabetes and Digestive and Kidney Diseases (NIH): www.niddk.nih.gov  Summary  · A healthy meal plan will help you control your blood glucose and maintain a healthy lifestyle.  · Working with a diet and nutrition specialist (dietitian) can help you make a meal plan that is best for you.  · Keep in mind that carbohydrates (carbs) and alcohol have immediate effects on your blood glucose levels. It is important to count carbs and to use alcohol carefully.  This information is not intended to   replace advice given to you by your health care provider. Make sure you discuss any questions you have with your health care provider.  Document Released: 07/13/2005 Document Revised: 05/16/2017 Document Reviewed: 11/20/2016  Elsevier Interactive Patient Education © 2019 Elsevier Inc.

## 2018-12-11 LAB — CMP14 + ANION GAP
ALT: 22 IU/L (ref 0–32)
AST: 27 IU/L (ref 0–40)
Albumin/Globulin Ratio: 1.7 (ref 1.2–2.2)
Albumin: 4 g/dL (ref 3.7–4.7)
Alkaline Phosphatase: 51 IU/L (ref 39–117)
Anion Gap: 17 mmol/L (ref 10.0–18.0)
BUN/Creatinine Ratio: 22 (ref 12–28)
BUN: 21 mg/dL (ref 8–27)
Bilirubin Total: 0.6 mg/dL (ref 0.0–1.2)
CO2: 25 mmol/L (ref 20–29)
Calcium: 9.3 mg/dL (ref 8.7–10.3)
Chloride: 103 mmol/L (ref 96–106)
Creatinine, Ser: 0.95 mg/dL (ref 0.57–1.00)
GFR calc Af Amer: 67 mL/min/{1.73_m2} (ref >59)
GFR calc non Af Amer: 58 mL/min/{1.73_m2} — ABNORMAL LOW (ref >59)
Globulin, Total: 2.3 g/dL (ref 1.5–4.5)
Glucose: 91 mg/dL (ref 65–99)
Potassium: 3.9 mmol/L (ref 3.5–5.2)
Sodium: 145 mmol/L — ABNORMAL HIGH (ref 134–144)
Total Protein: 6.3 g/dL (ref 6.0–8.5)

## 2018-12-11 LAB — LIPID PANEL
Chol/HDL Ratio: 4.4 ratio (ref 0.0–4.4)
Cholesterol, Total: 174 mg/dL (ref 100–199)
HDL: 40 mg/dL (ref >39)
LDL Calculated: 103 mg/dL — ABNORMAL HIGH (ref 0–99)
Triglycerides: 153 mg/dL — ABNORMAL HIGH (ref 0–149)
VLDL Cholesterol Cal: 31 mg/dL (ref 5–40)

## 2018-12-11 LAB — CBC
Hematocrit: 39.7 % (ref 34.0–46.6)
Hemoglobin: 13.8 g/dL (ref 11.1–15.9)
MCH: 30.9 pg (ref 26.6–33.0)
MCHC: 34.8 g/dL (ref 31.5–35.7)
MCV: 89 fL (ref 79–97)
Platelets: 218 10*3/uL (ref 150–450)
RBC: 4.46 x10E6/uL (ref 3.77–5.28)
RDW: 13.7 % (ref 11.7–15.4)
WBC: 10.2 10*3/uL (ref 3.4–10.8)

## 2018-12-11 LAB — HEMOGLOBIN A1C
Est. average glucose Bld gHb Est-mCnc: 137 mg/dL
Hgb A1c MFr Bld: 6.4 % — ABNORMAL HIGH (ref 4.8–5.6)

## 2019-01-24 ENCOUNTER — Other Ambulatory Visit: Payer: Self-pay

## 2019-01-24 MED ORDER — DULAGLUTIDE 0.75 MG/0.5ML ~~LOC~~ SOAJ: SUBCUTANEOUS | 0 refills | Status: DC

## 2019-01-28 ENCOUNTER — Encounter: Payer: Self-pay | Admitting: Internal Medicine

## 2019-01-31 ENCOUNTER — Ambulatory Visit: Payer: Self-pay

## 2019-01-31 NOTE — Chronic Care Management (AMB) (Signed)
  Chronic Care Management   Telephone Outreach Note  01/31/2019 Name: [name] MRN: [id number] DOB: [birth year]  Referred by: patient's health plan.   I reached out to [name] today by phone in response to a referral sent by [name] health plan.   Today's call was unsuccessful. SW left a HIPAA compliant voice message requesting a return call,   The CM team will reach out to the patient again over the next 7 days.    [name], Cheryl Matte has been notified of this outreach and [name]'s and follow-up plan.   [name], BSW, CDP [facility] / [facility] Care Management Social Worker [phone]  Total time spent performing care coordination and/or care management activities with the patient by phone or face to face = 5 minutes.

## 2019-02-05 ENCOUNTER — Ambulatory Visit: Payer: Self-pay

## 2019-02-05 DIAGNOSIS — I10 Essential (primary) hypertension: Secondary | ICD-10-CM

## 2019-02-05 DIAGNOSIS — N183 Chronic kidney disease, stage 3 unspecified: Secondary | ICD-10-CM

## 2019-02-05 DIAGNOSIS — E1165 Type 2 diabetes mellitus with hyperglycemia: Secondary | ICD-10-CM

## 2019-02-05 NOTE — Chronic Care Management (AMB) (Signed)
Chronic Care Management   Initial Visit Note  02/05/2019 Name: [name] MRN: [id number] DOB: 12/16/1940  Referred by: [name], MD Reason for referral : Care Coordination   [name] is a 78 y.o. year old female who is a primary care patient of [name], MD. The CCM team was consulted for assistance with chronic disease management and care coordination needs.   Review of patient status, including review of consultants reports, relevant laboratory and other test results, and collaboration with appropriate care team members and the patient's provider was performed as part of comprehensive patient evaluation and provision of chronic care management services.    I initiated and established the plan of care for [name] during one on one collaboration with my clinical care management colleague [name] BSW who is also engaged with this patient to address social work needs.    Goals Addressed      Patient Stated   . "I want to learn more about my insurance benefits" (pt-stated)       Current Barriers:  . Limited understanding in summary of benefits . Difficulty in understanding customer service representative . Unclear on current dental benefit package  Clinical Social Work Clinical Goal(s):  Marland Kitchen Over the next 30 days, patient will work with SW to address concerns related to the patients knowledge of payor benefits  . Over the next 45 days, patient will be knowledgeable of her current dental package  Interventions: . Patient interviewed and appropriate assessments performed . Discussed plans with patient for ongoing care management follow up and provided patient with direct contact information for care management team  . Scheduled follow up call with the patient to assist with outreach to [facility]  Patient Self Care Activities:  . Self administers medications as prescribed . Calls pharmacy for medication refills . Calls provider office for new  concerns or questions  Initial goal documentation    . "we need help with resources now that we are out of work" (pt-stated)       Current Barriers:  . Financial constraints  . Inability to work due to current "stay at home" orders during COVID-19 pandemic  Clinical Social Work Clinical Goal(s):   Over the next 30 days, patient will complete Medicaid and Development worker, international aid . Over the next 45 days, patient will follow up with DSS as directed by SW   Interventions: . Patient interviewed and appropriate assessments performed . Discussed plans with patient for ongoing care management follow up and provided patient with direct contact information for care management team  . Reviewed income eligibility for state funded programs . Educated the patient on the difference of full coverage Medicaid and [place] Medicaid  . Mailed the patient applications to apply for Medicaid and Food Stamps . Encouraged the patient to apply to these programs in hopes of gaining assistance  Patient Self Care Activities:  . Self administers medications as prescribed . Performs ADL's independently . Performs IADL's independently . Calls provider office for new concerns or questions  Initial goal documentation      Other   . Assist with Disease Management and Care Coordination        Current Barriers:   Knowledge Barriers related to resources and support available to address needs related to disease management and community resources  Case Manager Clinical Goal(s):  Marland Kitchen Over the next 30 days, patient will work with BSW to address needs related to disease management of specified comorbidities and  follow up on community and pharmacological resources provided.   Interventions:  . Collaborated with BSW and initiated plan of care to address needs related to community resources and financial assistance for cost of Trulicity  Patient Self Care Activities:  . Self administers medications as  prescribed . Performs ADL's independently . Performs IADL's independently . Calls provider office for new concerns or questions  Initial goal documentation       RNCM will outreach to patient for initial intake and to assess for CCM needs in 5-7 days.   Barb Merino, RN,CCM Care Management Coordinator [place] [facility]  Direct Phone: [phone]

## 2019-02-05 NOTE — Patient Instructions (Signed)
Social Worker Visit Information  Goals we discussed today:  Goals Addressed            This Visit's Progress     Patient Stated   . "I want to learn more about my insurance benefits" (pt-stated)       Current Barriers:  . Limited understanding in summary of benefits . Difficulty in understanding customer service representative . Unclear on current dental benefit package  Clinical Social Work Clinical Goal(s):  Marland Kitchen Over the next 30 days, patient will work with SW to address concerns related to the patients knowledge of payor benefits  . Over the next 45 days, patient will be knowledgeable of her current dental package  Interventions: . Patient interviewed and appropriate assessments performed . Discussed plans with patient for ongoing care management follow up and provided patient with direct contact information for care management team  . Scheduled follow up call with the patient to assist with outreach to [facility]  Patient Self Care Activities:  . Self administers medications as prescribed . Calls pharmacy for medication refills . Calls provider office for new concerns or questions  Initial goal documentation     . "we need help with resources now that we are out of work" (pt-stated)       Current Barriers:  . Financial constraints  . Inability to work due to current "stay at home" orders during COVID-19 pandemic  Clinical Social Work Clinical Goal(s):   Over the next 30 days, patient will complete Medicaid and Development worker, international aid . Over the next 45 days, patient will follow up with DSS as directed by SW   Interventions: . Patient interviewed and appropriate assessments performed . Discussed plans with patient for ongoing care management follow up and provided patient with direct contact information for care management team  . Reviewed income eligibility for state funded programs . Educated the patient on the difference of full coverage  Medicaid and [place] Medicaid  . Mailed the patient applications to apply for Medicaid and Food Stamps . Encouraged the patient to apply to these programs in hopes of gaining assistance  Patient Self Care Activities:  . Self administers medications as prescribed . Performs ADL's independently . Performs IADL's independently . Calls provider office for new concerns or questions  Initial goal documentation         Materials provided: Yes: Mailed the patient a Medicaid application and food stamp application  Ms. Jiron was given information about Chronic Care Management services today including:  1. CCM service includes personalized support from designated clinical staff supervised by her physician, including individualized plan of care and coordination with other care providers 2. 24/7 contact phone numbers for assistance for urgent and routine care needs. 3. Service will only be billed when office clinical staff spend 20 minutes or more in a month to coordinate care. 4. Only one practitioner may furnish and bill the service in a calendar month. 5. The patient may stop CCM services at any time (effective at the end of the month) by phone call to the office staff. 6. The patient will be responsible for cost sharing (co-pay) of up to 20% of the service fee (after annual deductible is met).  Patient agreed to services and verbal consent obtained.   The patient verbalized understanding of instructions provided today and declined a print copy of patient instruction materials.   Follow up plan: Appointment scheduled for SW follow up with client by phone on:  Monday  April 13th.   [name], BSW, CDP [facility] / [facility] Care Management Social Worker [phone]

## 2019-02-05 NOTE — Chronic Care Management (AMB) (Signed)
Chronic Care Management   Telephone Outreach Note  02/05/2019 Name: [name] MRN: [id number] DOB: 1941/03/09  Referred by: patient's health plan.   I reached out to [name] today by phone in response to a referral sent by [name] health plan. [name] and I briefly discussed care management needs related to DMII.  [name] was given information about Chronic Care Management services today including:  1. CCM service includes personalized support from designated clinical staff supervised by her physician, including individualized plan of care and coordination with other care providers 2. 24/7 contact phone numbers for assistance for urgent and routine care needs. 3. Service will only be billed when office clinical staff spend 20 minutes or more in a month to coordinate care. 4. Only one practitioner may furnish and bill the service in a calendar month. 5. The patient may stop CCM services at any time (effective at the end of the month) by phone call to the office staff. 6. The patient will be responsible for cost sharing (co-pay) of up to 20% of the service fee (after annual deductible is met).  Patient agreed to services and verbal consent obtained.   Review of patient status, including review of consultants reports, relevant laboratory and other test results, and collaboration with appropriate care team members and the patient's provider was performed as part of comprehensive patient evaluation and provision of chronic care management services.    SDOH (Social Determinants of Health) screening performed today. See Care Plan Entry related to challenges with: Financial Strain  Stress   Goals Addressed            This Visit's Progress     Patient Stated   . "I want to learn more about my insurance benefits" (pt-stated)       Current Barriers:  . Limited understanding in summary of benefits . Difficulty in understanding customer service  representative . Unclear on current dental benefit package  Clinical Social Work Clinical Goal(s):  Marland Kitchen Over the next 30 days, patient will work with SW to address concerns related to the patients knowledge of payor benefits  . Over the next 45 days, patient will be knowledgeable of her current dental package  Interventions: . Patient interviewed and appropriate assessments performed . Discussed plans with patient for ongoing care management follow up and provided patient with direct contact information for care management team  . Scheduled follow up call with the patient to assist with outreach to [facility]  Patient Self Care Activities:  . Self administers medications as prescribed . Calls pharmacy for medication refills . Calls provider office for new concerns or questions  Initial goal documentation     . "we need help with resources now that we are out of work" (pt-stated)       Current Barriers:  . Financial constraints  . Inability to work due to current "stay at home" orders during COVID-19 pandemic  Clinical Social Work Clinical Goal(s):   Over the next 30 days, patient will complete Medicaid and Development worker, international aid . Over the next 45 days, patient will follow up with DSS as directed by SW   Interventions: . Patient interviewed and appropriate assessments performed . Discussed plans with patient for ongoing care management follow up and provided patient with direct contact information for care management team  . Reviewed income eligibility for state funded programs . Educated the patient on the difference of full coverage Medicaid and [place] Medicaid  .  Mailed the patient applications to apply for Medicaid and Food Stamps . Encouraged the patient to apply to these programs in hopes of gaining assistance  Patient Self Care Activities:  . Self administers medications as prescribed . Performs ADL's independently . Performs IADL's independently  . Calls provider office for new concerns or questions  Initial goal documentation        Telephone follow up appointment with CCM team member scheduled for: Monday April 13.   [name], MD has been notified of this outreach and [name]'s decision and plan.   [name], BSW, CDP [facility] / [facility] Care Management Social Worker [phone]  Total time spent performing care coordination and/or care management activities with the patient by phone or face to face = 44 minutes.

## 2019-02-10 ENCOUNTER — Ambulatory Visit: Payer: Self-pay

## 2019-02-10 ENCOUNTER — Telehealth: Payer: Self-pay

## 2019-02-10 ENCOUNTER — Other Ambulatory Visit: Payer: Self-pay

## 2019-02-10 DIAGNOSIS — I10 Essential (primary) hypertension: Secondary | ICD-10-CM

## 2019-02-10 DIAGNOSIS — N183 Chronic kidney disease, stage 3 unspecified: Secondary | ICD-10-CM

## 2019-02-10 DIAGNOSIS — E1165 Type 2 diabetes mellitus with hyperglycemia: Secondary | ICD-10-CM

## 2019-02-10 NOTE — Chronic Care Management (AMB) (Signed)
  Chronic Care Management    Clinical Social Work Follow Up Note  02/10/2019 Name: [name] MRN: [id number] DOB: 12-Sep-1941  [name] is a 78 y.o. year old female who is a primary care patient of [name], MD.   Review of patient status, including review of consultants reports, other relevant assessments, and collaboration with appropriate care team members and the patient's provider was performed as part of comprehensive patient evaluation and provision of chronic care management services.    I placed a follow up call to the patient to confirm receipt of mailed resources and assist the patient in an outreach call to her health plan regarding benefits. The patient is able to confirm receipt of mailed resources but reports being unavailable at this time to contact her health plan. The patient requests SW call back at a later time.   Goals Addressed            This Visit's Progress     Patient Stated   . "we need help with resources now that we are out of work" (pt-stated)   On track    Current Barriers:  . Financial constraints  . Inability to work due to current "stay at home" orders during COVID-19 pandemic  Clinical Social Work Clinical Goal(s):   Over the next 30 days, patient will complete Medicaid and Development worker, international aid . Over the next 45 days, patient will follow up with DSS as directed by SW   Interventions: . Telephonic follow up to the patient by CCM SW . Confirmed the patient has received previously mailed applications for Medicaid and Food and Nutrition Services . Encouraged the patient to complete applications and submit in a timely manner  Patient Self Care Activities:  . Self administers medications as prescribed . Performs ADL's independently . Performs IADL's independently . Calls provider office for new concerns or questions  Please see past updates related to this goal by clicking on the "Past Updates" button in the  selected goal          Follow Up Plan: SW will follow up with patient by phone over the next week.   [name], BSW, CDP [facility] / [facility] Care Management Social Worker [phone]  Total time spent performing care coordination and/or care management activities with the patient by phone or face to face = 10 minutes.

## 2019-02-10 NOTE — Telephone Encounter (Signed)
Patient called requesting a sample of trulicity she stated she is unable to afford it at this time. Patient notified that her sample is ready to be picked up. YRL,RMA

## 2019-02-10 NOTE — Patient Instructions (Signed)
Social Worker Visit Information  Goals we discussed today:  Goals Addressed            This Visit's Progress     Patient Stated   . "we need help with resources now that we are out of work" (pt-stated)   On track    Current Barriers:  . Financial constraints  . Inability to work due to current "stay at home" orders during COVID-19 pandemic  Clinical Social Work Clinical Goal(s):   Over the next 30 days, patient will complete Medicaid and Development worker, international aid . Over the next 45 days, patient will follow up with DSS as directed by SW   Interventions: . Telephonic follow up to the patient by CCM SW . Confirmed the patient has received previously mailed applications for Medicaid and Food and Nutrition Services . Encouraged the patient to complete applications and submit in a timely manner  Patient Self Care Activities:  . Self administers medications as prescribed . Performs ADL's independently . Performs IADL's independently . Calls provider office for new concerns or questions  Please see past updates related to this goal by clicking on the "Past Updates" button in the selected goal          Materials Provided: No: Patient declined  Follow Up Plan: SW will follow up with patient by phone over the next week.   [name], BSW, CDP [facility] / [facility] Care Management Social Worker [phone]

## 2019-02-11 ENCOUNTER — Ambulatory Visit: Payer: Self-pay

## 2019-02-11 NOTE — Progress Notes (Signed)
  Chronic Care Management   Outreach Note  02/11/2019 Name: [name] MRN: [id number] DOB: 01/14/1941  Referred by: [name], MD Reason for referral : Chronic Care Management   An unsuccessful telephone outreach was attempted today. The patient was referred to the case management team by for assistance with medication assistance  Follow Up Plan: The CM team will reach out to the patient again over the next 3 days.     Regina Eck, PharmD, BCPS Clinical Pharmacist, [facility] [place]: [phone]

## 2019-02-12 ENCOUNTER — Telehealth: Payer: Self-pay

## 2019-02-12 ENCOUNTER — Ambulatory Visit: Payer: Self-pay

## 2019-02-12 DIAGNOSIS — I10 Essential (primary) hypertension: Secondary | ICD-10-CM

## 2019-02-12 DIAGNOSIS — E1165 Type 2 diabetes mellitus with hyperglycemia: Secondary | ICD-10-CM

## 2019-02-12 NOTE — Chronic Care Management (AMB) (Signed)
   Chronic Care Management   Outreach Note  02/12/2019 Name: [name] MRN: [id number] DOB: Jan 07, 1941  Referred by: [name], MD Reason for referral : Chronic Care Management (INITIAL CCM RN TELEPHONE OUTREACH)   An unsuccessful telephone outreach was attempted today. The patient was referred to the case management team by [facility] Medicare for assistance with chronic disease management and care coordination.  Follow Up Plan: The CM team will reach out to the patient again over the next 5-7 days.    Barb Merino, RN,CCM Care Management Coordinator [facility]  Direct Phone: [phone]

## 2019-02-13 ENCOUNTER — Telehealth: Payer: Self-pay

## 2019-02-13 NOTE — Telephone Encounter (Signed)
Called pt to remind her to complete her cologuard kit and she stated she dropped a part of the kit in the toilet so she is in need of a new kit. I provided pt with number and told her to call them to see if they will send her another kit. Patient also stated she feels like she may have a UTI and would like an appointment. Appointment as been scheduled. YRL,RMA

## 2019-02-14 ENCOUNTER — Telehealth: Payer: Self-pay

## 2019-02-17 ENCOUNTER — Encounter: Payer: Self-pay | Admitting: Nurse Practitioner

## 2019-02-17 ENCOUNTER — Ambulatory Visit (INDEPENDENT_AMBULATORY_CARE_PROVIDER_SITE_OTHER): Payer: Medicare Other | Admitting: Nurse Practitioner

## 2019-02-17 ENCOUNTER — Telehealth: Payer: Self-pay

## 2019-02-17 ENCOUNTER — Other Ambulatory Visit: Payer: Self-pay

## 2019-02-17 ENCOUNTER — Ambulatory Visit: Payer: Self-pay

## 2019-02-17 VITALS — BP 130/80 | HR 73 | Temp 98.5°F | Ht 67.4 in | Wt 243.0 lb

## 2019-02-17 DIAGNOSIS — E1165 Type 2 diabetes mellitus with hyperglycemia: Secondary | ICD-10-CM | POA: Diagnosis not present

## 2019-02-17 DIAGNOSIS — N183 Chronic kidney disease, stage 3 unspecified: Secondary | ICD-10-CM

## 2019-02-17 DIAGNOSIS — R350 Frequency of micturition: Secondary | ICD-10-CM | POA: Insufficient documentation

## 2019-02-17 DIAGNOSIS — K5900 Constipation, unspecified: Secondary | ICD-10-CM | POA: Diagnosis not present

## 2019-02-17 DIAGNOSIS — R140 Abdominal distension (gaseous): Secondary | ICD-10-CM | POA: Insufficient documentation

## 2019-02-17 DIAGNOSIS — R42 Dizziness and giddiness: Secondary | ICD-10-CM

## 2019-02-17 DIAGNOSIS — I10 Essential (primary) hypertension: Secondary | ICD-10-CM

## 2019-02-17 HISTORY — DX: Frequency of micturition: R35.0

## 2019-02-17 HISTORY — DX: Abdominal distension (gaseous): R14.0

## 2019-02-17 HISTORY — DX: Dizziness and giddiness: R42

## 2019-02-17 LAB — POCT URINALYSIS DIPSTICK
Bilirubin, UA: NEGATIVE
Blood, UA: NEGATIVE
Glucose, UA: NEGATIVE
Ketones, UA: NEGATIVE
Leukocytes, UA: NEGATIVE
Nitrite, UA: NEGATIVE
Protein, UA: NEGATIVE
Spec Grav, UA: 1.01 (ref 1.010–1.025)
Urobilinogen, UA: 0.2 E.U./dL
pH, UA: 6 (ref 5.0–8.0)

## 2019-02-17 NOTE — Patient Instructions (Signed)
Social Worker Visit Information  Goals we discussed today:  Goals Addressed            This Visit's Progress     Patient Stated   . "I want to learn more about my insurance benefits" (pt-stated)   Not on track    Current Barriers:  . Limited understanding in summary of benefits . Difficulty in understanding customer service representative . Unclear on current dental benefit package  Clinical Social Work Clinical Goal(s):  Marland Kitchen Over the next 30 days, patient will work with SW to address concerns related to the patients knowledge of payor benefits  . Over the next 45 days, patient will be knowledgeable of her current dental package  Interventions: . Telephonic follow up by CCM SW attempted to assist with insurance benefit call  Patient Self Care Activities:  . Self administers medications as prescribed . Calls pharmacy for medication refills . Calls provider office for new concerns or questions  Please see past updates related to this goal by clicking on the "Past Updates" button in the selected goal          Materials Provided: No. Patient not reached.  Follow Up Plan: SW will follow up with patient by phone over the next 7-10 days.  [name], BSW, CDP [facility] Care Management Social Worker [phone]

## 2019-02-17 NOTE — Patient Instructions (Addendum)
   Increase intake of Lemon water

## 2019-02-17 NOTE — Chronic Care Management (AMB) (Signed)
  Chronic Care Management   Social Work Note  02/17/2019 Name: [name] MRN: [id number] DOB: [birth year]  [name] is a 78 y.o. year old female who sees [name] FNP for primary care. The CCM team was consulted for assistance with chronic care management and care coordination of resource needs.   Goals Addressed            This Visit's Progress     Patient Stated   . "I want to learn more about my insurance benefits" (pt-stated)   Not on track    Current Barriers:  . Limited understanding in summary of benefits . Difficulty in understanding customer service representative . Unclear on current dental benefit package  Clinical Social Work Clinical Goal(s):  Marland Kitchen Over the next 30 days, patient will work with SW to address concerns related to the patients knowledge of payor benefits  . Over the next 45 days, patient will be knowledgeable of her current dental package  Interventions: . Telephonic follow up by CCM SW attempted to assist with insurance benefit call  Patient Self Care Activities:  . Self administers medications as prescribed . Calls pharmacy for medication refills . Calls provider office for new concerns or questions  Please see past updates related to this goal by clicking on the "Past Updates" button in the selected goal       Follow Up Plan: SW will follow up with patient by phone over the next 7-10 days.  [name], BSW, CDP [facility] Care Management Social Worker [phone]  Total time spent performing care coordination and/or care management activities with the patient by phone or face to face = 5 minutes.

## 2019-02-17 NOTE — Progress Notes (Signed)
Subjective:     Patient ID: [name] , female    DOB: Jun 01, 1941 , 78 y.o.   MRN: [id number]   Chief Complaint  Patient presents with  . Urinary Tract Infection  . Bloated    HPI  Abdominal bloating - she ate corn and began having problems.  She has been using more seasoning.  She has a history of diverticulosis.  She is having small round balls of stool.    Average day - sausage and eggs, or oatmeal and eggs.  Applesauce.  Usually no bread.  Would eat more biscuits when working.  Stop using restaraunts when had e-coli. Has been without a probiotic for at least one month.   Urinary Tract Infection   This is a new problem. The current episode started in the past 7 days (5 days ago). The problem occurs intermittently. The quality of the pain is described as burning. There has been no fever. She is not sexually active. There is no history of pyelonephritis. Associated symptoms include frequency. Pertinent negatives include no chills, nausea, urgency or vomiting.     Past Medical History:  Diagnosis Date  . Abdominal pain   . Arthritis    cervical disc degeneration/ oa left knee, carpal tunnel rt wrist, adhesive capsulitis right shoulder, rt hand weakness; lumbar degeneration  . Carpal tunnel syndrome   . Colon adenoma   . 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 Constipation   . Diabetes mellitus without complication ([facility])    borderline - diet control  . Diverticulitis hx of  . Diverticulosis   . Frequent UTI    hx of urethral injury during colon surgery - states frequent uti's since  . GERD (gastroesophageal reflux disease)   . H/O hiatal hernia   . History of palpitations    in the past  . History of shingles    has a lingering itching on back where shingles were  . Hyperlipidemia   . Hypertension    . Numbness and tingling in right hand    pt. states has numbness of right hand very frequently-watch positioning  . Obesity   . Osteoarthritis   . Osteoporosis   . Pain    pain left knee and pain right hip and right groin  . Personal history of colonic polyps-adenoma 08/26/2008  . Pneumonia 2005  . Vitamin D deficiency      Family History  Problem Relation Age of Onset  . Breast cancer Mother 11  . Hypertension Mother   . Prostate cancer Father   . Colon cancer Father 18  . Hypertension Father   . Dementia Sister   . Lung cancer Brother   . Hypertension Brother   . COPD Brother   . Cancer Maternal Grandmother   . Arthritis Sister   . Hypertension Sister   . Arthritis Sister   . Hypertension Child   . Hypertension Child   . Hypertension Child      Current Outpatient Medications:  .  allopurinol (ZYLOPRIM) 300 MG tablet, Take 1 tablet (300 mg total) by mouth daily., Disp: 90 tablet, Rfl: 0 .  amLODipine (NORVASC) 10 MG tablet, Take 10 mg by mouth daily., Disp: , Rfl:  .  amLODipine-benazepril (LOTREL) 5-10 MG capsule, Take 1 capsule by mouth daily., Disp: 90 capsule, Rfl: 1 .  aspirin EC 81 MG tablet, Take 81 mg by mouth daily., Disp: , Rfl:  .  atenolol-chlorthalidone (TENORETIC) 50-25 MG tablet, TAKE 1 ATBLET BY MOUTH DAILY AT 11 AM, Disp: 90 tablet, Rfl: 0 .  Dulaglutide (TRULICITY) 9.38 BO/1.7PZ SOPN, INJECT 0.5 ML(0.75 MG) SUBCUTANEOUSLY EVERY WEEK IN ABDOMEN, THIGH OR UPPER ARM ROTATING INJECTION SITE, Disp: 2 mL, Rfl: 0 .  MELATONIN PO, Take 2 tablets by mouth at bedtime as needed (For sleep.)., Disp: , Rfl:  .  ONETOUCH DELICA LANCETS 02H MISC, Use to check blood glucose once daily, Disp: 100 each, Rfl: 3 .  Probiotic Product (PROBIOTIC DAILY PO), Take 3 capsules by mouth daily., Disp: , Rfl:  .  rosuvastatin (CRESTOR) 20 MG tablet, TAKE 1 TABLET BY MOUTH ONCE DAILY, Disp: 90 tablet, Rfl: 0   Allergies  Allergen Reactions  . Atorvastatin Calcium Itching     Other reaction(s): Itching  . Bee Venom Hives  . Lipitor [Atorvastatin Calcium] Itching  . Oxycodone Other (See Comments)    hallucinations  . Vicodin [Hydrocodone-Acetaminophen] Other (See Comments)    hallucinations     Review of Systems  Constitutional: Negative.  Negative for chills.  Respiratory: Negative.   Cardiovascular: Negative.   Gastrointestinal: Positive for abdominal distention. Negative for abdominal pain, constipation, nausea and vomiting.       Increasing flatulence  Genitourinary: Positive for dysuria and frequency. Negative for urgency.  Musculoskeletal: Negative for neck stiffness.  Skin: Negative.   Psychiatric/Behavioral: Negative.      Today's Vitals   02/17/19 0910  BP: 130/80  Pulse: 73  Temp: 98.5 F (36.9 C)  TempSrc: Oral  SpO2: 98%  Weight: 243 lb (110.2 kg)  Height: 5' 7.4" (1.712 m)   Body mass index is 37.61 kg/m.   Objective:  Physical Exam Constitutional:      Appearance: Normal appearance.  Cardiovascular:     Rate and Rhythm: Normal rate and regular rhythm.     Pulses: Normal pulses.     Heart sounds: Normal heart sounds. No murmur.  Pulmonary:     Effort: Pulmonary effort is normal.     Breath sounds: Normal breath sounds.  Abdominal:     General: Abdomen is flat. Bowel sounds are normal. There is distension.     Palpations: Abdomen is soft.  Skin:    General: Skin is warm and dry.     Capillary Refill: Capillary refill takes less than 2 seconds.  Neurological:     General: No focal deficit present.     Mental Status: She is alert and oriented to person, place, and time.  Psychiatric:        Mood and Affect: Mood normal.        Behavior: Behavior normal.        Thought Content: Thought content normal.        Judgment: Judgment normal.         Assessment And Plan:     1. Urinary frequency  Urinalysis was normal  Encouraged to drink lemon water if not better return call to office - POCT Urinalysis Dipstick  (81002)  2. Abdominal bloating  Abdomen is slightly distended  Normoactive bowel sounds  Encouraged to take a probiotic daily and to increase her water intake  3. Uncontrolled type 2 diabetes mellitus with hyperglycemia (HCC)  Chronic, fair control   Continue with current medications, she request samples of diabetic medications  I will refer to CCM for assistance with medications   Encouraged to  limit intake of sugary foods and drinks  Encouraged to increase physical activity to 150 minutes per week - Referral to Chronic Care Management Services  4. Constipation, unspecified constipation type  Increase water intake   Probiotic daily   Encouraged to take miralax as needed and if not effective will consider Linzess  5. Lightheaded  orthostats are slightly positive encouraged to increase her water intake  Will check cbc today as well  Urinalysis was normal - CBC no Diff - BMP8+Anion Gap        [name], FNP    THE PATIENT IS ENCOURAGED TO PRACTICE SOCIAL DISTANCING DUE TO THE COVID-19 PANDEMIC.

## 2019-02-17 NOTE — Telephone Encounter (Signed)
error 

## 2019-02-18 LAB — BMP8+ANION GAP
Anion Gap: 18 mmol/L (ref 10.0–18.0)
BUN/Creatinine Ratio: 16 (ref 12–28)
BUN: 16 mg/dL (ref 8–27)
CO2: 23 mmol/L (ref 20–29)
Calcium: 9.6 mg/dL (ref 8.7–10.3)
Chloride: 99 mmol/L (ref 96–106)
Creatinine, Ser: 0.99 mg/dL (ref 0.57–1.00)
GFR calc Af Amer: 64 mL/min/{1.73_m2} (ref >59)
GFR calc non Af Amer: 56 mL/min/{1.73_m2} — ABNORMAL LOW (ref >59)
Glucose: 112 mg/dL — ABNORMAL HIGH (ref 65–99)
Potassium: 3.8 mmol/L (ref 3.5–5.2)
Sodium: 140 mmol/L (ref 134–144)

## 2019-02-18 LAB — CBC
Hematocrit: 42.6 % (ref 34.0–46.6)
Hemoglobin: 14.6 g/dL (ref 11.1–15.9)
MCH: 30.2 pg (ref 26.6–33.0)
MCHC: 34.3 g/dL (ref 31.5–35.7)
MCV: 88 fL (ref 79–97)
Platelets: 243 10*3/uL (ref 150–450)
RBC: 4.83 x10E6/uL (ref 3.77–5.28)
RDW: 13.2 % (ref 11.7–15.4)
WBC: 10.6 10*3/uL (ref 3.4–10.8)

## 2019-02-20 ENCOUNTER — Other Ambulatory Visit: Payer: Self-pay

## 2019-02-20 ENCOUNTER — Ambulatory Visit (INDEPENDENT_AMBULATORY_CARE_PROVIDER_SITE_OTHER): Payer: Medicare Other

## 2019-02-20 DIAGNOSIS — E1165 Type 2 diabetes mellitus with hyperglycemia: Secondary | ICD-10-CM

## 2019-02-20 NOTE — Patient Instructions (Signed)
Visit Information   Diabetes Mellitus and Nutrition, Adult When you have diabetes (diabetes mellitus), it is very important to have healthy eating habits because your blood sugar (glucose) levels are greatly affected by what you eat and drink. Eating healthy foods in the appropriate amounts, at about the same times every day, can help you:  Control your blood glucose.  Lower your risk of heart disease.  Improve your blood pressure.  Reach or maintain a healthy weight. Every person with diabetes is different, and each person has different needs for a meal plan. Your health care provider may recommend that you work with a diet and nutrition specialist (dietitian) to make a meal plan that is best for you. Your meal plan may vary depending on factors such as:  The calories you need.  The medicines you take.  Your weight.  Your blood glucose, blood pressure, and cholesterol levels.  Your activity level.  Other health conditions you have, such as heart or kidney disease. How do carbohydrates affect me? Carbohydrates, also called carbs, affect your blood glucose level more than any other type of food. Eating carbs naturally raises the amount of glucose in your blood. Carb counting is a method for keeping track of how many carbs you eat. Counting carbs is important to keep your blood glucose at a healthy level, especially if you use insulin or take certain oral diabetes medicines. It is important to know how many carbs you can safely have in each meal. This is different for every person. Your dietitian can help you calculate how many carbs you should have at each meal and for each snack. Foods that contain carbs include:  Bread, cereal, rice, pasta, and crackers.  Potatoes and corn.  Peas, beans, and lentils.  Milk and yogurt.  Fruit and juice.  Desserts, such as cakes, cookies, ice cream, and candy. How does alcohol affect me? Alcohol can cause a sudden decrease in blood glucose  (hypoglycemia), especially if you use insulin or take certain oral diabetes medicines. Hypoglycemia can be a life-threatening condition. Symptoms of hypoglycemia (sleepiness, dizziness, and confusion) are similar to symptoms of having too much alcohol. If your health care provider says that alcohol is safe for you, follow these guidelines:  Limit alcohol intake to no more than 1 drink per day for nonpregnant women and 2 drinks per day for men. One drink equals 12 oz of beer, 5 oz of wine, or 1 oz of hard liquor.  Do not drink on an empty stomach.  Keep yourself hydrated with water, diet soda, or unsweetened iced tea.  Keep in mind that regular soda, juice, and other mixers may contain a lot of sugar and must be counted as carbs. What are tips for following this plan?  Reading food labels  Start by checking the serving size on the "Nutrition Facts" label of packaged foods and drinks. The amount of calories, carbs, fats, and other nutrients listed on the label is based on one serving of the item. Many items contain more than one serving per package.  Check the total grams (g) of carbs in one serving. You can calculate the number of servings of carbs in one serving by dividing the total carbs by 15. For example, if a food has 30 g of total carbs, it would be equal to 2 servings of carbs.  Check the number of grams (g) of saturated and trans fats in one serving. Choose foods that have low or no amount of these fats.  Check the number of milligrams (mg) of salt (sodium) in one serving. Most people should limit total sodium intake to less than 2,300 mg per day.  Always check the nutrition information of foods labeled as "low-fat" or "nonfat". These foods may be higher in added sugar or refined carbs and should be avoided.  Talk to your dietitian to identify your daily goals for nutrients listed on the label. Shopping  Avoid buying canned, premade, or processed foods. These foods tend to be high  in fat, sodium, and added sugar.  Shop around the outside edge of the grocery store. This includes fresh fruits and vegetables, bulk grains, fresh meats, and fresh dairy. Cooking  Use low-heat cooking methods, such as baking, instead of high-heat cooking methods like deep frying.  Cook using healthy oils, such as olive, canola, or sunflower oil.  Avoid cooking with butter, cream, or high-fat meats. Meal planning  Eat meals and snacks regularly, preferably at the same times every day. Avoid going long periods of time without eating.  Eat foods high in fiber, such as fresh fruits, vegetables, beans, and whole grains. Talk to your dietitian about how many servings of carbs you can eat at each meal.  Eat 4-6 ounces (oz) of lean protein each day, such as lean meat, chicken, fish, eggs, or tofu. One oz of lean protein is equal to: ? 1 oz of meat, chicken, or fish. ? 1 egg. ?  cup of tofu.  Eat some foods each day that contain healthy fats, such as avocado, nuts, seeds, and fish. Lifestyle  Check your blood glucose regularly.  Exercise regularly as told by your health care provider. This may include: ? 150 minutes of moderate-intensity or vigorous-intensity exercise each week. This could be brisk walking, biking, or water aerobics. ? Stretching and doing strength exercises, such as yoga or weightlifting, at least 2 times a week.  Take medicines as told by your health care provider.  Do not use any products that contain nicotine or tobacco, such as cigarettes and e-cigarettes. If you need help quitting, ask your health care provider.  Work with a Social worker or diabetes educator to identify strategies to manage stress and any emotional and social challenges. Questions to ask a health care provider  Do I need to meet with a diabetes educator?  Do I need to meet with a dietitian?  What number can I call if I have questions?  When are the best times to check my blood glucose? Where to  find more information:  American Diabetes Association: diabetes.org  Academy of Nutrition and Dietetics: www.eatright.[employer] of Diabetes and Digestive and Kidney Diseases (NIH): [employer] Summary  A healthy meal plan will help you control your blood glucose and maintain a healthy lifestyle.  Working with a diet and nutrition specialist (dietitian) can help you make a meal plan that is best for you.  Keep in mind that carbohydrates (carbs) and alcohol have immediate effects on your blood glucose levels. It is important to count carbs and to use alcohol carefully. This information is not intended to replace advice given to you by your health care provider. Make sure you discuss any questions you have with your health care provider. Document Released: 07/13/2005 Document Revised: 05/16/2017 Document Reviewed: 11/20/2016 Elsevier Interactive Patient Education  2019 [place].  Diabetes Mellitus and Exercise Exercising regularly is important for your overall health, especially when you have diabetes (diabetes mellitus). Exercising is not only about losing weight. It has many other  health benefits, such as increasing muscle strength and bone density and reducing body fat and stress. This leads to improved fitness, flexibility, and endurance, all of which result in better overall health. Exercise has additional benefits for people with diabetes, including:  Reducing appetite.  Helping to lower and control blood glucose.  Lowering blood pressure.  Helping to control amounts of fatty substances (lipids) in the blood, such as cholesterol and triglycerides.  Helping the body to respond better to insulin (improving insulin sensitivity).  Reducing how much insulin the body needs.  Decreasing the risk for heart disease by: ? Lowering cholesterol and triglyceride levels. ? Increasing the levels of good cholesterol. ? Lowering blood glucose levels. What is my  activity plan? Your health care provider or certified diabetes educator can help you make a plan for the type and frequency of exercise (activity plan) that works for you. Make sure that you:  Do at least 150 minutes of moderate-intensity or vigorous-intensity exercise each week. This could be brisk walking, biking, or water aerobics. ? Do stretching and strength exercises, such as yoga or weightlifting, at least 2 times a week. ? Spread out your activity over at least 3 days of the week.  Get some form of physical activity every day. ? Do not go more than 2 days in a row without some kind of physical activity. ? Avoid being inactive for more than 30 minutes at a time. Take frequent breaks to walk or stretch.  Choose a type of exercise or activity that you enjoy, and set realistic goals.  Start slowly, and gradually increase the intensity of your exercise over time. What do I need to know about managing my diabetes?   Check your blood glucose before and after exercising. ? If your blood glucose is 240 mg/dL (13.3 mmol/L) or higher before you exercise, check your urine for ketones. If you have ketones in your urine, do not exercise until your blood glucose returns to normal. ? If your blood glucose is 100 mg/dL (5.6 mmol/L) or lower, eat a snack containing 15-20 grams of carbohydrate. Check your blood glucose 15 minutes after the snack to make sure that your level is above 100 mg/dL (5.6 mmol/L) before you start your exercise.  Know the symptoms of low blood glucose (hypoglycemia) and how to treat it. Your risk for hypoglycemia increases during and after exercise. Common symptoms of hypoglycemia can include: ? Hunger. ? Anxiety. ? Sweating and feeling clammy. ? Confusion. ? Dizziness or feeling light-headed. ? Increased heart rate or palpitations. ? Blurry vision. ? Tingling or numbness around the mouth, lips, or tongue. ? Tremors or shakes. ? Irritability.  Keep a rapid-acting  carbohydrate snack available before, during, and after exercise to help prevent or treat hypoglycemia.  Avoid injecting insulin into areas of the body that are going to be exercised. For example, avoid injecting insulin into: ? The arms, when playing tennis. ? The legs, when jogging.  Keep records of your exercise habits. Doing this can help you and your health care provider adjust your diabetes management plan as needed. Write down: ? Food that you eat before and after you exercise. ? Blood glucose levels before and after you exercise. ? The type and amount of exercise you have done. ? When your insulin is expected to peak, if you use insulin. Avoid exercising at times when your insulin is peaking.  When you start a new exercise or activity, work with your health care provider to make sure the  activity is safe for you, and to adjust your insulin, medicines, or food intake as needed.  Drink plenty of water while you exercise to prevent dehydration or heat stroke. Drink enough fluid to keep your urine clear or pale yellow. Summary  Exercising regularly is important for your overall health, especially when you have diabetes (diabetes mellitus).  Exercising has many health benefits, such as increasing muscle strength and bone density and reducing body fat and stress.  Your health care provider or certified diabetes educator can help you make a plan for the type and frequency of exercise (activity plan) that works for you.  When you start a new exercise or activity, work with your health care provider to make sure the activity is safe for you, and to adjust your insulin, medicines, or food intake as needed. This information is not intended to replace advice given to you by your health care provider. Make sure you discuss any questions you have with your health care provider. Document Released: 01/06/2004 Document Revised: 04/26/2017 Document Reviewed: 03/27/2016 Elsevier Interactive Patient  Education  2019 [place] Addressed            This Visit's Progress   . "I want to get my sugar under control" (pt-stated)       Current Barriers:  Marland Kitchen Knowledge Deficits related to DM disease management. Mrs. Turri states she doesn't like having to check her blood sugar or take medication for diabetes so she wants to work hard to get her blood sugar under control. We discussed the value of good glucose management whether she needs to stay on medication long term. She is very eager to learn about self health management strategies related to DM.   Nurse Case Manager Clinical Goal(s):  Marland Kitchen Over the next 30 days, patient will verbalize understanding of plan for self health management strategies related to diabetes.   . Over the next 14 days, patient will activate nutrition strategies to promote improved cbg control: manage portion sizes utilizing plate method, journal cbg response to meals, increase vegetable intake . Over the next 14 days, patient will maintain or improve daily exercise/walking regimen  Interventions:  . Evaluation of current treatment plan related to DM  and patient's adherence to plan as established by provider. . Advised patient to adhere to prescribed medication regimen, prescribed carb modified/heart healthy/low sodium diet, prescribed exercise regimen.  . Provided education to patient re: carb modified diet, appropriate exercise regimen. Patient has taken up habit of walking (measuring daily steps) in her home.  . Discussed plans with patient for ongoing care management follow up and provided patient with direct contact information for care management team  Patient Self Care Activities:  . Currently UNABLE TO independently manage DM . Self administers medications as prescribed . Attends all scheduled provider appointments . Calls pharmacy for medication refills . Attends church or other social activities . Performs ADL's independently . Performs IADL's  independently . Calls provider office for new concerns or questions  Initial goal documentation        The patient verbalized understanding of instructions provided today and declined a print copy of patient instruction materials.   Telephone follow up appointment with CCM team member scheduled for: 02/27/19  Janalyn Shy [facility] Nurse Care Coordinator [facility] Care Management [phone]

## 2019-02-20 NOTE — Chronic Care Management (AMB) (Signed)
  Chronic Care Management   Follow Up Note   02/20/2019 Name: Mahari Vankirk MRN: [id number] DOB: Feb 04, 1941  Referred by: [name], MD Reason for referral : Chronic Care Management (Initial Assessment DM)   Bryssa Tones is a 78 y.o. year old female who is a primary care patient of [name], MD. The CCM team was consulted for assistance with chronic disease management and care coordination needs.    Review of patient status, including review of consultants reports, relevant laboratory and other test results, and collaboration with appropriate care team members and the patient's provider was performed as part of comprehensive patient evaluation and provision of chronic care management services.    Objective:   Lab Results  Component Value Date   HGBA1C 6.4 (H) 12/10/2018   HGBA1C 6.5 (H) 10/08/2018   HGBA1C 7.0 (H) 08/26/2018   Lab Results  Component Value Date   LDLCALC 103 (H) 12/10/2018   CREATININE 0.99 02/17/2019    Goals Addressed    . "I want to get my sugar under control" (pt-stated)       Current Barriers:  Marland Kitchen Knowledge Deficits related to DM disease management. Mrs. Ghanem states she doesn't like having to check her blood sugar or take medication for diabetes so she wants to work hard to get her blood sugar under control. We discussed the value of good glucose management whether she needs to stay on medication long term. She is very eager to learn about self health management strategies related to DM.   Nurse Case Manager Clinical Goal(s):  Marland Kitchen Over the next 30 days, patient will verbalize understanding of plan for self health management strategies related to diabetes.   . Over the next 14 days, patient will activate nutrition strategies to promote improved cbg control: manage portion sizes utilizing plate method, journal cbg response to meals, increase vegetable intake . Over the next 14 days, patient will maintain or improve daily exercise/walking regimen   Interventions:  . Evaluation of current treatment plan related to DM  and patient's adherence to plan as established by provider. . Advised patient to adhere to prescribed medication regimen, prescribed carb modified/heart healthy/low sodium diet, prescribed exercise regimen.  . Provided education to patient re: carb modified diet, appropriate exercise regimen. Patient has taken up habit of walking (measuring daily steps) in her home.  . Discussed plans with patient for ongoing care management follow up and provided patient with direct contact information for care management team  Patient Self Care Activities:  . Currently UNABLE TO independently manage DM . Self administers medications as prescribed . Attends all scheduled provider appointments . Calls pharmacy for medication refills . Attends church or other social activities . Performs ADL's independently . Performs IADL's independently . Calls provider office for new concerns or questions  Initial goal documentation     Telephone follow up appointment with CCM team member scheduled for: 02/27/19   Janalyn Shy [facility] Nurse Care Coordinator [facility] Care Management [phone]

## 2019-02-25 ENCOUNTER — Ambulatory Visit: Payer: Self-pay

## 2019-02-25 DIAGNOSIS — N183 Chronic kidney disease, stage 3 unspecified: Secondary | ICD-10-CM

## 2019-02-25 DIAGNOSIS — E1165 Type 2 diabetes mellitus with hyperglycemia: Secondary | ICD-10-CM

## 2019-02-25 DIAGNOSIS — I10 Essential (primary) hypertension: Secondary | ICD-10-CM

## 2019-02-25 NOTE — Patient Instructions (Addendum)
Social Worker Visit Information  Goals we discussed today:  Goals Addressed            This Visit's Progress     Patient Stated   . "I need some dental work" (pt-stated)       Current Barriers:  . Film/video editor  . Limited education about health plan benefits  Clinical Social Work Clinical Goal(s):  Marland Kitchen Over the next 60 days, client will follow up with an in network dental provider as directed by SW  Interventions: . Patient interviewed and appropriate assessments performed . Discussed plans with patient for ongoing care management follow up and provided patient with direct contact information for care management team . Assisted patient/caregiver with obtaining information about health plan benefits  . Ordered a catalog of in network providers to be shipped to the patients home . Advised the patient to contact an in network dentist upon receipt of catalog . Collaboration with [facility] to update on patients stated goal and patients concern that she is experiencing gastrointestinal symptoms from tooth drainage  Patient Self Care Activities:  . Attends all scheduled provider appointments . Calls pharmacy for medication refills . Calls provider office for new concerns or questions  Initial goal documentation     . "I want to learn more about my insurance benefits" (pt-stated)   On track    Current Barriers:  . Limited understanding in summary of benefits . Difficulty in understanding customer service representative . Unclear on current dental benefit package  Clinical Social Work Clinical Goal(s):  Marland Kitchen Over the next 30 days, patient will work with SW to address concerns related to the patients knowledge of payor benefits  . Over the next 45 days, patient will be knowledgeable of her current dental package  Interventions: . Telephonic follow up by CCM SW  . Assisted the patient in contacting her health plan to determine dental and vision  benefits . Reinforced health plan benefits with the patient explaining in laymans terms . Requested a booklet indicating in network providers be mailed to the patients home . Educated the patient on her over the counter benefits under her health plan . Advised the patient she is eligible for a life alert at no cost - see new goal . Encouraged the patient to utilize her over the counter benefit as a way to save money on out of pocket expenses for particular items  Patient Self Care Activities:  . Self administers medications as prescribed . Calls pharmacy for medication refills . Calls provider office for new concerns or questions  Please see past updates related to this goal by clicking on the "Past Updates" button in the selected goal      . "I want to order a life alert" (pt-stated)       Current Barriers:  . Financial constraints  . Limited knowledge of health plan benefit  Clinical Social Work Clinical Goal(s):  Marland Kitchen Over the next 30 days, client will work with SW to address concerns related to accessing a life alert pendant  Interventions:  Assisted patient/caregiver with obtaining information about health plan benefits   Initiated phone call to the patients health plan to confirm patient eligibility of the [place] the patient in ordering a personal emergency response system covered under the patients health plan  Advised the patient she would receive his new system in the mail in 10-15 business days  Patient Self Care Activities:  . Self administers  medications as prescribed . Attends all scheduled provider appointments . Calls provider office for new concerns or questions  Initial goal documentation         Materials Provided: Verbal education about health plan benefits provided by phone  Follow Up Plan: SW will follow up with patient by phone over the next two weeks   [name], [place], CDP [facility] / [place]  Worker [phone]

## 2019-02-25 NOTE — Chronic Care Management (AMB) (Addendum)
Chronic Care Management    Clinical Social Work Follow Up Note  02/25/2019 Name: Jody Aguinaga MRN: [id number] DOB: June 19, 1941  Averly Ericson is a 78 y.o. year old female who is a primary care patient of [name], MD. The CCM team was consulted for assistance with [employer].   Review of patient status, including review of consultants reports, other relevant assessments, and collaboration with appropriate care team members and the patient's provider was performed as part of comprehensive patient evaluation and provision of chronic care management services.    Goals Addressed            This Visit's Progress     Patient Stated   . "I need some dental work" (pt-stated)       Current Barriers:  . Film/video editor  . Limited education about health plan benefits  Clinical Social Work Clinical Goal(s):  Marland Kitchen Over the next 60 days, client will follow up with an in network dental provider as directed by SW  Interventions: . Patient interviewed and appropriate assessments performed . Discussed plans with patient for ongoing care management follow up and provided patient with direct contact information for care management team . Assisted patient/caregiver with obtaining information about health plan benefits  . Ordered a catalog of in network providers to be shipped to the patients home . Advised the patient to contact an in network dentist upon receipt of catalog . Collaboration with [place] to update on patients stated goal and patients concern that she is experiencing gastrointestinal symptoms from tooth drainage  Patient Self Care Activities:  . Attends all scheduled provider appointments . Calls pharmacy for medication refills . Calls provider office for new concerns or questions  Initial goal documentation     . "I want to learn more about my insurance benefits" (pt-stated)   On track    Current Barriers:  . Limited understanding in  summary of benefits . Difficulty in understanding customer service representative . Unclear on current dental benefit package  Clinical Social Work Clinical Goal(s):  Marland Kitchen Over the next 30 days, patient will work with SW to address concerns related to the patients knowledge of payor benefits  . Over the next 45 days, patient will be knowledgeable of her current dental package  Interventions: . Telephonic follow up by CCM SW  . Assisted the patient in contacting her health plan to determine dental and vision benefits . Reinforced health plan benefits with the patient explaining in laymans terms . Requested a booklet indicating in network providers be mailed to the patients home . Educated the patient on her over the counter benefits under her health plan . Advised the patient she is eligible for a life alert at no cost - see new goal . Encouraged the patient to utilize her over the counter benefit as a way to save money on out of pocket expenses for particular items  Patient Self Care Activities:  . Self administers medications as prescribed . Calls pharmacy for medication refills . Calls provider office for new concerns or questions  Please see past updates related to this goal by clicking on the "Past Updates" button in the selected goal      . "I want to order a life alert" (pt-stated)       Current Barriers:  . Financial constraints  . Limited knowledge of health plan benefit  Clinical Social Work Clinical Goal(s):  Marland Kitchen Over the next 30 days, client will work with SW  to address concerns related to accessing a life alert pendant  Interventions:  Assisted patient/caregiver with obtaining information about health plan benefits   Initiated phone call to the patients health plan to confirm patient eligibility of the [place] the patient in ordering a personal emergency response system covered under the patients health plan  Advised the patient she would  receive his new system in the mail in 10-15 business days  Patient Self Care Activities:  . Self administers medications as prescribed . Attends all scheduled provider appointments . Calls provider office for new concerns or questions  Initial goal documentation          Follow Up Plan: SW will follow up with patient by phone over the next two weeks   [name], [place], CDP [facility] Care Management Social Worker [phone]  Total time spent performing care coordination and/or care management activities with the patient by phone or face to face = 85 minutes.

## 2019-02-26 ENCOUNTER — Encounter: Payer: Self-pay | Admitting: Nurse Practitioner

## 2019-02-27 ENCOUNTER — Telehealth: Payer: Self-pay

## 2019-03-10 ENCOUNTER — Ambulatory Visit: Payer: Self-pay

## 2019-03-10 DIAGNOSIS — Z1212 Encounter for screening for malignant neoplasm of rectum: Secondary | ICD-10-CM | POA: Diagnosis not present

## 2019-03-10 DIAGNOSIS — Z1211 Encounter for screening for malignant neoplasm of colon: Secondary | ICD-10-CM | POA: Diagnosis not present

## 2019-03-10 DIAGNOSIS — E1165 Type 2 diabetes mellitus with hyperglycemia: Secondary | ICD-10-CM

## 2019-03-10 DIAGNOSIS — I10 Essential (primary) hypertension: Secondary | ICD-10-CM

## 2019-03-10 DIAGNOSIS — N183 Chronic kidney disease, stage 3 unspecified: Secondary | ICD-10-CM

## 2019-03-10 NOTE — Chronic Care Management (AMB) (Signed)
Chronic Care Management    Clinical Social Work Follow Up Note  03/10/2019 Name: [name] MRN: [id number] DOB: 06/25/1941  [name] is a 78 y.o. year old female who is a primary care patient of [name], MD. The CCM team was consulted for assistance with [employer].   Review of patient status, including review of consultants reports, other relevant assessments, and collaboration with appropriate care team members and the patient's provider was performed as part of comprehensive patient evaluation and provision of chronic care management services.    I placed a follow up call to the patient on today's date to assess progress of patient stated goals.   Goals Addressed            This Visit's Progress     Patient Stated   . "I want to order a life alert" (pt-stated)   On track    Current Barriers:  . Financial constraints  . Limited knowledge of health plan benefit  Clinical Social Work Clinical Goal(s):  Marland Kitchen Over the next 30 days, client will work with SW to address concerns related to accessing a life alert pendant  Interventions:  Telephonic follow up by CCM SW  Determined the patient has received life alert system in the mail provided by her health plan  Encouraged the patient to contact her health plan to assist with set-up  Determined the patient would like a system that works both inside and outside of the home - unfortunately, the system covered under the patients health plan only works inside the home  Educated the patient on the [place] program  Provided the patient with the contact number to the [place] program and encouraged her to follow up with [facility] regarding enrollment  Patient Self Care Activities:  . Self administers medications as prescribed . Attends all scheduled provider appointments . Calls provider office for new concerns or questions  Please see past updates related to this goal by clicking on the "Past Updates"  button in the selected goal      . COMPLETED: "we need help with resources now that we are out of work" (pt-stated)       Current Barriers:  . Financial constraints  . Inability to work due to current "stay at home" orders during COVID-19 pandemic  Clinical Social Work Clinical Goal(s):   Over the next 30 days, patient will complete Medicaid and Development worker, international aid . Over the next 45 days, patient will follow up with DSS as directed by SW   Interventions: . Telephonic follow up to the patient by CCM SW . Concluded the patient has not completed the assistance applications but reports speaking with someone from DSS whom informed the patient she and her spouse are over the income limit to receive any assistance . Offered to assist the patient in identifying local food pantries the patient declined this stating "it's okay, our social security keeps [place] fed"  Patient Self Care Activities:  . Self administers medications as prescribed . Performs ADL's independently . Performs IADL's independently . Calls provider office for new concerns or questions  Please see past updates related to this goal by clicking on the "Past Updates" button in the selected goal          Follow Up Plan: SW will follow up with patient by phone over the next 2-3 weeks   [name], [place], CDP [facility] Care Management Social Worker [phone]  Total time spent performing  care coordination and/or care management activities with the patient by phone or face to face = 37 minutes.

## 2019-03-10 NOTE — Patient Instructions (Signed)
Social Worker Visit Information  Goals we discussed today:  Goals Addressed            This Visit's Progress     Patient Stated   . "I want to order a life alert" (pt-stated)   On track    Current Barriers:  . Financial constraints  . Limited knowledge of health plan benefit  Clinical Social Work Clinical Goal(s):  Marland Kitchen Over the next 30 days, client will work with SW to address concerns related to accessing a life alert pendant  Interventions:  Telephonic follow up by CCM SW  Determined the patient has received life alert system in the mail provided by her health plan  Encouraged the patient to contact her health plan to assist with set-up  Determined the patient would like a system that works both inside and outside of the home - unfortunately, the system covered under the patients health plan only works inside the home  Educated the patient on the [place] program  Provided the patient with the contact number to the [place] program and encouraged her to follow up with [facility] regarding enrollment  Patient Self Care Activities:  . Self administers medications as prescribed . Attends all scheduled provider appointments . Calls provider office for new concerns or questions  Please see past updates related to this goal by clicking on the "Past Updates" button in the selected goal      . COMPLETED: "we need help with resources now that we are out of work" (pt-stated)       Current Barriers:  . Financial constraints  . Inability to work due to current "stay at home" orders during COVID-19 pandemic  Clinical Social Work Clinical Goal(s):   Over the next 30 days, patient will complete Medicaid and Development worker, international aid . Over the next 45 days, patient will follow up with DSS as directed by SW   Interventions: . Telephonic follow up to the patient by CCM SW . Concluded the patient has not completed the assistance applications but reports  speaking with someone from DSS whom informed the patient she and her spouse are over the income limit to receive any assistance . Offered to assist the patient in identifying local food pantries the patient declined this stating "it's okay, our social security keeps [place] fed"  Patient Self Care Activities:  . Self administers medications as prescribed . Performs ADL's independently . Performs IADL's independently . Calls provider office for new concerns or questions  Please see past updates related to this goal by clicking on the "Past Updates" button in the selected goal          Materials Provided: Verbal education about community resources provided by phone  Follow Up Plan: SW will follow up with patient by phone over the next 2-3 weeks   [name], [place], CDP [facility] / [place] Worker [phone]

## 2019-03-11 ENCOUNTER — Telehealth: Payer: Self-pay

## 2019-03-11 ENCOUNTER — Other Ambulatory Visit: Payer: Self-pay

## 2019-03-11 NOTE — Patient Outreach (Signed)
[facility]) Care Management  03/11/2019  [name] Apr 29, 1941 [id number]   Medication Adherence call to [name] [place] [facility] spoke with patient she is due on Trulicity patient explain she is using it 1 pen every week and has already pick up from the pharmacy. [name] is showing past due under [place].  [facility] Direct Dial [phone]  Fax [phone] [email or web address]

## 2019-03-14 ENCOUNTER — Other Ambulatory Visit: Payer: Self-pay | Admitting: Internal Medicine

## 2019-03-14 ENCOUNTER — Ambulatory Visit (INDEPENDENT_AMBULATORY_CARE_PROVIDER_SITE_OTHER): Payer: Medicare Other | Admitting: Pharmacist

## 2019-03-14 DIAGNOSIS — E1165 Type 2 diabetes mellitus with hyperglycemia: Secondary | ICD-10-CM

## 2019-03-14 DIAGNOSIS — E1122 Type 2 diabetes mellitus with diabetic chronic kidney disease: Secondary | ICD-10-CM | POA: Diagnosis not present

## 2019-03-14 DIAGNOSIS — I10 Essential (primary) hypertension: Secondary | ICD-10-CM | POA: Diagnosis not present

## 2019-03-14 DIAGNOSIS — N183 Chronic kidney disease, stage 3 unspecified: Secondary | ICD-10-CM

## 2019-03-14 DIAGNOSIS — I129 Hypertensive chronic kidney disease with stage 1 through stage 4 chronic kidney disease, or unspecified chronic kidney disease: Secondary | ICD-10-CM | POA: Diagnosis not present

## 2019-03-14 NOTE — Progress Notes (Signed)
Chronic Care Management   Initial Visit Note  03/14/2019 Name: Suprena Travaglini MRN: [id number] DOB: 1941/03/25  Referred by: [name], MD Reason for referral : Chronic Care Management-->medication assistance/management  Charnell Peplinski is a 78 y.o. year old female who is a primary care patient of [name], MD. The CCM team was consulted for assistance with chronic disease management and care coordination needs.   Review of patient status, including review of consultants reports, relevant laboratory and other test results, and collaboration with appropriate care team members and the patient's provider was performed as part of comprehensive patient evaluation and provision of chronic care management services.    Objective:    Goals Addressed            This Visit's Progress     Patient Stated   . I would like financial assistance with my Trulicity (pt-stated)       Current Barriers:  . Financial Barriers-patient cannot afford Trulicity through insurance.  It places patient in the coverage gap and monthly copay is too high.  Patient has been getting samples via PCP office most recently.  Pharmacist Clinical Goal(s):  Marland Kitchen Over the next 14 days, patient will work with CCM Pharmacist to address needs related to applying for financial assistance (Trulicity)  Interventions: . Comprehensive medication review performed. . Advised patient to continue taking medications as prescribed, checking BGs & eating a DM healthy diet. . Discussed plans with patient for ongoing care management follow up and provided patient with direct contact information for care management team  . Will meet with patient in provider's office on 03/19/19 to complete paperwork for [facility] Patient assistance program to obtain Trulicity.  Patient verbalizes understanding and would like to proceed with application.  Patient Self Care Activities:  . Self administers medications as prescribed . Attends all  scheduled provider appointments . Calls pharmacy for medication refills  Initial goal documentation    . I would like to keep my blood sugar at goal & maintain my A1c (pt-stated)       Current Barriers:  . Financial . COVID-19-->patient is not as active due to some COVID-19 restrictions.  She reports eating more at home, however her BGs readings have been mostly at goal.  Pharmacist Clinical Goal(s):  Marland Kitchen Over the next 30 days, patient will demonstrate Improved medication adherence as evidenced by blood sugar & A1C labs at goal.    Interventions: . Comprehensive medication review performed. . Discussed plans with patient for ongoing care management follow up and provided patient with direct contact information for care management team  . Counseled patient on S/SX of hypoglycemia (patientdenies BG<70) . Patient reports FBGs of 100-129.  Encouraged patient to continue the great work!  She reports eating more since she is staying at home now.  She continues to check her BGs as directed.  She reports having functional glucometer & supplies. She continues to get samples of Trulicity from PCP, but we will be assisting patient with applying for assistance.  Patient Self Care Activities:  . Self administers medications as prescribed . Attends all scheduled provider appointments . Calls pharmacy for medication refills  Initial goal documentation      Plan:  -I will follow up with patient after PCP visit next week on 03/19/19 to apply for assistance -The CM team will reach out to the patient again over the next 7 days.     Regina Eck, PharmD, BCPS Clinical Pharmacist, [facility] [place]  [place]: [phone]

## 2019-03-14 NOTE — Patient Instructions (Signed)
Visit Information  Goals Addressed            This Visit's Progress     Patient Stated   . I would like financial assistance with my Trulicity (pt-stated)       Current Barriers:  . Financial Barriers-patient cannot afford Trulicity through insurance.  It places patient in the coverage gap and monthly copay is too high.  Patient has been getting samples via PCP office most recently.  Pharmacist Clinical Goal(s):  Marland Kitchen Over the next 14 days, patient will work with CCM Pharmacist to address needs related to applying for financial assistance (Trulicity)  Interventions: . Comprehensive medication review performed. . Advised patient to continue taking medications as prescribed, checking BGs & eating a DM healthy diet. . Discussed plans with patient for ongoing care management follow up and provided patient with direct contact information for care management team  . Will meet with patient in provider's office on 03/19/19 to complete paperwork for [facility] Patient assistance program to obtain Trulicity.  Patient verbalizes understanding and would like to proceed with application.  Patient Self Care Activities:  . Self administers medications as prescribed . Attends all scheduled provider appointments . Calls pharmacy for medication refills  Initial goal documentation     . I would like to keep my blood sugar at goal & maintain my A1c (pt-stated)       Current Barriers:  . Financial . COVID-19-->patient is not as active due to some COVID-19 restrictions.  She reports eating more at home, however her BGs readings have been mostly at goal.  Pharmacist Clinical Goal(s):  Marland Kitchen Over the next 30 days, patient will demonstrate Improved medication adherence as evidenced by blood sugar & A1C labs at goal.    Interventions: . Comprehensive medication review performed. . Discussed plans with patient for ongoing care management follow up and provided patient with direct contact information for care  management team  . Counseled patient on S/SX of hypoglycemia (patientdenies BG<70) . Patient reports FBGs of 100-129.  Encouraged patient to continue the great work!  She reports eating more since she is staying at home now.  She continues to check her BGs as directed.  She reports having functional glucometer & supplies. She continues to get samples of Trulicity from PCP, but we will be assisting patient with applying for assistance.  Patient Self Care Activities:  . Self administers medications as prescribed . Attends all scheduled provider appointments . Calls pharmacy for medication refills  Initial goal documentation        The patient verbalized understanding of instructions provided today and declined a print copy of patient instruction materials.   The CM team will reach out to the patient again over the next 7 days.    Regina Eck, PharmD, BCPS Clinical Pharmacist, [facility] [place]: [phone]

## 2019-03-15 ENCOUNTER — Other Ambulatory Visit: Payer: Self-pay | Admitting: Internal Medicine

## 2019-03-15 LAB — COLOGUARD: Cologuard: NEGATIVE

## 2019-03-19 ENCOUNTER — Encounter: Payer: Self-pay | Admitting: Internal Medicine

## 2019-03-19 ENCOUNTER — Ambulatory Visit: Payer: Self-pay | Admitting: Pharmacist

## 2019-03-19 ENCOUNTER — Telehealth: Payer: Self-pay

## 2019-03-19 ENCOUNTER — Ambulatory Visit: Payer: Medicare Other | Admitting: Internal Medicine

## 2019-03-19 ENCOUNTER — Other Ambulatory Visit: Payer: Self-pay

## 2019-03-19 ENCOUNTER — Ambulatory Visit (INDEPENDENT_AMBULATORY_CARE_PROVIDER_SITE_OTHER): Payer: Medicare Other

## 2019-03-19 ENCOUNTER — Ambulatory Visit (INDEPENDENT_AMBULATORY_CARE_PROVIDER_SITE_OTHER): Payer: Medicare Other | Admitting: Internal Medicine

## 2019-03-19 VITALS — BP 130/74 | HR 62 | Temp 98.5°F | Ht 64.6 in | Wt 242.6 lb

## 2019-03-19 VITALS — BP 130/74 | HR 62 | Temp 98.5°F | Ht 64.6 in | Wt 242.8 lb

## 2019-03-19 DIAGNOSIS — E1165 Type 2 diabetes mellitus with hyperglycemia: Secondary | ICD-10-CM | POA: Diagnosis not present

## 2019-03-19 DIAGNOSIS — I129 Hypertensive chronic kidney disease with stage 1 through stage 4 chronic kidney disease, or unspecified chronic kidney disease: Secondary | ICD-10-CM | POA: Diagnosis not present

## 2019-03-19 DIAGNOSIS — E1122 Type 2 diabetes mellitus with diabetic chronic kidney disease: Secondary | ICD-10-CM

## 2019-03-19 DIAGNOSIS — Z6841 Body Mass Index (BMI) 40.0 and over, adult: Secondary | ICD-10-CM

## 2019-03-19 DIAGNOSIS — S61219A Laceration without foreign body of unspecified finger without damage to nail, initial encounter: Secondary | ICD-10-CM

## 2019-03-19 DIAGNOSIS — M1A9XX0 Chronic gout, unspecified, without tophus (tophi): Secondary | ICD-10-CM

## 2019-03-19 DIAGNOSIS — Z0000 Encounter for general adult medical examination without abnormal findings: Secondary | ICD-10-CM | POA: Diagnosis not present

## 2019-03-19 DIAGNOSIS — N183 Chronic kidney disease, stage 3 unspecified: Secondary | ICD-10-CM

## 2019-03-19 LAB — POCT URINALYSIS DIPSTICK
Bilirubin, UA: NEGATIVE
Blood, UA: NEGATIVE
Glucose, UA: NEGATIVE
Ketones, UA: NEGATIVE
Leukocytes, UA: NEGATIVE
Nitrite, UA: NEGATIVE
Protein, UA: NEGATIVE
Spec Grav, UA: 1.02 (ref 1.010–1.025)
Urobilinogen, UA: 0.2 E.U./dL
pH, UA: 7 (ref 5.0–8.0)

## 2019-03-19 LAB — POCT UA - MICROALBUMIN
Albumin/Creatinine Ratio, Urine, POC: <30
Creatinine, POC: 300 mg/dL
Microalbumin Ur, POC: 10 mg/L

## 2019-03-19 NOTE — Addendum Note (Signed)
Addended by: Kellie Simmering on: 03/19/2019 02:06 PM   Modules accepted: Orders

## 2019-03-19 NOTE — Patient Instructions (Signed)
[name] , Thank you for taking time to come for your Medicare Wellness Visit. I appreciate your ongoing commitment to your health goals. Please review the following plan we discussed and let me know if I can assist you in the future.   Screening recommendations/referrals: Colonoscopy: 11/2013 Mammogram: 02/2018 Bone Density: 09/2015 Recommended yearly ophthalmology/optometry visit for glaucoma screening and checkup Recommended yearly dental visit for hygiene and checkup  Vaccinations: Influenza vaccine: 09/2018 Pneumococcal vaccine: 03/2015 Tdap vaccine: 03/2015 Shingles vaccine: discussed    Advanced directives: Advance directive discussed with you today. I have provided a copy for you to complete at home and have notarized. Once this is complete please bring a copy in to our office so we can scan it into your chart.   Conditions/risks identified: Obesity  Next appointment: 03/23/2020 at 10:30   Preventive Care 65 Years and Older, Female Preventive care refers to lifestyle choices and visits with your health care provider that can promote health and wellness. What does preventive care include?  A yearly physical exam. This is also called an annual well check.  Dental exams once or twice a year.  Routine eye exams. Ask your health care provider how often you should have your eyes checked.  Personal lifestyle choices, including:  Daily care of your teeth and gums.  Regular physical activity.  Eating a healthy diet.  Avoiding tobacco and drug use.  Limiting alcohol use.  Practicing safe sex.  Taking low-dose aspirin every day.  Taking vitamin and mineral supplements as recommended by your health care provider. What happens during an annual well check? The services and screenings done by your health care provider during your annual well check will depend on your age, overall health, lifestyle risk factors, and family history of disease. Counseling  Your health care  provider may ask you questions about your:  Alcohol use.  Tobacco use.  Drug use.  Emotional well-being.  Home and relationship well-being.  Sexual activity.  Eating habits.  History of falls.  Memory and ability to understand (cognition).  Work and work Statistician.  Reproductive health. Screening  You may have the following tests or measurements:  Height, weight, and BMI.  Blood pressure.  Lipid and cholesterol levels. These may be checked every 5 years, or more frequently if you are over 50 years old.  Skin check.  Lung cancer screening. You may have this screening every year starting at age 70 if you have a 30-pack-year history of smoking and currently smoke or have quit within the past 15 years.  Fecal occult blood test (FOBT) of the stool. You may have this test every year starting at age 56.  Flexible sigmoidoscopy or colonoscopy. You may have a sigmoidoscopy every 5 years or a colonoscopy every 10 years starting at age 22.  Hepatitis C blood test.  Hepatitis B blood test.  Sexually transmitted disease (STD) testing.  Diabetes screening. This is done by checking your blood sugar (glucose) after you have not eaten for a while (fasting). You may have this done every 1-3 years.  Bone density scan. This is done to screen for osteoporosis. You may have this done starting at age 4.  Mammogram. This may be done every 1-2 years. Talk to your health care provider about how often you should have regular mammograms. Talk with your health care provider about your test results, treatment options, and if necessary, the need for more tests. Vaccines  Your health care provider may recommend certain vaccines, such as:  Influenza vaccine. This is recommended every year.  Tetanus, diphtheria, and acellular pertussis (Tdap, Td) vaccine. You may need a Td booster every 10 years.  Zoster vaccine. You may need this after age 85.  Pneumococcal 13-valent conjugate (PCV13)  vaccine. One dose is recommended after age 5.  Pneumococcal polysaccharide (PPSV23) vaccine. One dose is recommended after age 14. Talk to your health care provider about which screenings and vaccines you need and how often you need them. This information is not intended to replace advice given to you by your health care provider. Make sure you discuss any questions you have with your health care provider. Document Released: 11/12/2015 Document Revised: 07/05/2016 Document Reviewed: 08/17/2015 Elsevier Interactive Patient Education  2017 [place] Prevention in the Home Falls can cause injuries. They can happen to people of all ages. There are many things you can do to make your home safe and to help prevent falls. What can I do on the outside of my home?  Regularly fix the edges of walkways and driveways and fix any cracks.  Remove anything that might make you trip as you walk through a door, such as a raised step or threshold.  Trim any bushes or trees on the path to your home.  Use bright outdoor lighting.  Clear any walking paths of anything that might make someone trip, such as rocks or tools.  Regularly check to see if handrails are loose or broken. Make sure that both sides of any steps have handrails.  Any raised decks and porches should have guardrails on the edges.  Have any leaves, snow, or ice cleared regularly.  Use sand or salt on walking paths during winter.  Clean up any spills in your garage right away. This includes oil or grease spills. What can I do in the bathroom?  Use night lights.  Install grab bars by the toilet and in the tub and shower. Do not use towel bars as grab bars.  Use non-skid mats or decals in the tub or shower.  If you need to sit down in the shower, use a plastic, non-slip stool.  Keep the floor dry. Clean up any water that spills on the floor as soon as it happens.  Remove soap buildup in the tub or shower regularly.   Attach bath mats securely with double-sided non-slip rug tape.  Do not have throw rugs and other things on the floor that can make you trip. What can I do in the bedroom?  Use night lights.  Make sure that you have a light by your bed that is easy to reach.  Do not use any sheets or blankets that are too big for your bed. They should not hang down onto the floor.  Have a firm chair that has side arms. You can use this for support while you get dressed.  Do not have throw rugs and other things on the floor that can make you trip. What can I do in the kitchen?  Clean up any spills right away.  Avoid walking on wet floors.  Keep items that you use a lot in easy-to-reach places.  If you need to reach something above you, use a strong step stool that has a grab bar.  Keep electrical cords out of the way.  Do not use floor polish or wax that makes floors slippery. If you must use wax, use non-skid floor wax.  Do not have throw rugs and other things on the floor that  can make you trip. What can I do with my stairs?  Do not leave any items on the stairs.  Make sure that there are handrails on both sides of the stairs and use them. Fix handrails that are broken or loose. Make sure that handrails are as long as the stairways.  Check any carpeting to make sure that it is firmly attached to the stairs. Fix any carpet that is loose or worn.  Avoid having throw rugs at the top or bottom of the stairs. If you do have throw rugs, attach them to the floor with carpet tape.  Make sure that you have a light switch at the top of the stairs and the bottom of the stairs. If you do not have them, ask someone to add them for you. What else can I do to help prevent falls?  Wear shoes that:  Do not have high heels.  Have rubber bottoms.  Are comfortable and fit you well.  Are closed at the toe. Do not wear sandals.  If you use a stepladder:  Make sure that it is fully opened. Do not climb  a closed stepladder.  Make sure that both sides of the stepladder are locked into place.  Ask someone to hold it for you, if possible.  Clearly mark and make sure that you can see:  Any grab bars or handrails.  First and last steps.  Where the edge of each step is.  Use tools that help you move around (mobility aids) if they are needed. These include:  Canes.  Walkers.  Scooters.  Crutches.  Turn on the lights when you go into a dark area. Replace any light bulbs as soon as they burn out.  Set up your furniture so you have a clear path. Avoid moving your furniture around.  If any of your floors are uneven, fix them.  If there are any pets around you, be aware of where they are.  Review your medicines with your doctor. Some medicines can make you feel dizzy. This can increase your chance of falling. Ask your doctor what other things that you can do to help prevent falls. This information is not intended to replace advice given to you by your health care provider. Make sure you discuss any questions you have with your health care provider. Document Released: 08/12/2009 Document Revised: 03/23/2016 Document Reviewed: 11/20/2014 Elsevier Interactive Patient Education  2017 [employer].

## 2019-03-19 NOTE — Progress Notes (Signed)
Subjective:   [name] is a 78 y.o. female who presents for Medicare Annual (Subsequent) preventive examination.  Review of Systems:  n/a Cardiac Risk Factors include: advanced age (>71men, >40 women);diabetes mellitus;dyslipidemia;hypertension;sedentary lifestyle;obesity (BMI >30kg/m2)     Objective:     Vitals: BP 130/74 (Patient Position: Sitting)    Pulse 62    Temp 98.5 F (36.9 C) (Oral)    Ht 5' 4.6" (1.641 m)    Wt 242 lb 12.8 oz (110.1 kg)    BMI 40.91 kg/m   Body mass index is 40.91 kg/m.  Advanced Directives 03/19/2019 02/05/2019 08/31/2018 07/12/2017 01/17/2016 01/04/2016 10/24/2015  Does Patient Have a Medical Advance Directive? No No No No No No No  Would patient like information on creating a medical advance directive? Yes (MAU/Ambulatory/Procedural Areas - Information given) No - Patient declined No - Patient declined - Yes - Educational materials given No - patient declined information No - patient declined information  Pre-existing out of facility DNR order (yellow form or pink MOST form) - - - - - - -    Tobacco Social History   Tobacco Use  Smoking Status Never Smoker  Smokeless Tobacco Never Used     Counseling given: Not Answered   Clinical Intake:  Pre-visit preparation completed: Yes  Pain : No/denies pain Pain Score: 0-No pain     Nutritional Status: BMI > 30  Obese Nutritional Risks: None Diabetes: Yes CBG done?: No Did pt. bring in CBG monitor from home?: No  How often do you need to have someone help you when you read instructions, pamphlets, or other written materials from your doctor or pharmacy?: 1 - Never What is the last grade level you completed in school?: some college  Interpreter Needed?: No  Information entered by :: NAllen LPN  Past Medical History:  Diagnosis Date   Abdominal pain    Arthritis    cervical disc degeneration/ oa left knee, carpal tunnel rt wrist, adhesive capsulitis right shoulder, rt hand weakness;  lumbar degeneration   Carpal tunnel syndrome    Colon adenoma    Complication of anesthesia 2006-at [facility]   breathing problems-no BP med given prior to surgery;  hx of being very sleepy after colon surgery --  states no problems with last right total knee replacement 2013   Constipation    Diabetes mellitus without complication ([place])    borderline - diet control   Diverticulitis hx of   Diverticulosis    Frequent UTI    hx of urethral injury during colon surgery - states frequent uti's since   GERD (gastroesophageal reflux disease)    H/O hiatal hernia    History of palpitations    in the past   History of shingles    has a lingering itching on back where shingles were   Hyperlipidemia    Hypertension    Numbness and tingling in right hand    pt. states has numbness of right hand very frequently-watch positioning   Obesity    Osteoarthritis    Osteoporosis    Pain    pain left knee and pain right hip and right groin   Personal history of colonic polyps-adenoma 08/26/2008   Pneumonia 2005   Vitamin D deficiency    Past Surgical History:  Procedure Laterality Date   ABDOMINAL HYSTERECTOMY     APPENDECTOMY     bladder tack     BREAST BIOPSY Left    BREAST SURGERY     breast  duct resection- benign   CARPAL TUNNEL RELEASE Right 06/04/2014   Procedure: RIGHT CARPAL TUNNEL RELEASE;  Surgeon: Roseanne Kaufman, MD;  Location: [facility];  Service: Orthopedics;  Laterality: Right;   COLON RESECTION  2008   hx diverticulosis   COLON SURGERY     colonoscopy [id number]     JOINT REPLACEMENT     OOPHORECTOMY     skin graft left arm - traumatic compression injury left upper arm     temporary ureter stent     TOTAL KNEE ARTHROPLASTY  01/05/2012   Procedure: TOTAL KNEE ARTHROPLASTY;  Surgeon: [name], MD;  Location: [facility];  Service: Orthopedics;  Laterality: Right;   TOTAL KNEE ARTHROPLASTY Left 08/17/2014   Procedure:  LEFT TOTAL KNEE ARTHROPLASTY;  Surgeon: [name], MD;  Location: [facility];  Service: Orthopedics;  Laterality: Left;   ureter repair for tyransected left ureter     Family History  Problem Relation Age of Onset   Breast cancer Mother 79   Hypertension Mother    Prostate cancer Father    Colon cancer Father 50   Hypertension Father    Dementia Sister    Lung cancer Brother    Hypertension Brother    COPD Brother    Cancer Maternal Grandmother    Arthritis Sister    Hypertension Sister    Arthritis Sister    Hypertension Child    Hypertension Child    Hypertension Child    Social History   Socioeconomic History   Marital status: Married    Spouse name: Not on file   Number of children: 6   Years of education: Not on file   Highest education level: Not on file  Occupational History   Occupation: Games developer: Occupational psychologist  Social Needs   Financial resource strain: Somewhat hard   Food insecurity:    Worry: Often true    Inability: Often true   Transportation needs:    Medical: No    Non-medical: No  Tobacco Use   Smoking status: Never Smoker   Smokeless tobacco: Never Used  Substance and Sexual Activity   Alcohol use: No   Drug use: No   Sexual activity: Yes  Lifestyle   Physical activity:    Days per week: 0 days    Minutes per session: 0 min   Stress: Rather much  Relationships   Social connections:    Talks on phone: More than three times a week    Gets together: More than three times a week    Attends religious service: More than 4 times per year    Active member of club or organization: Yes    Attends meetings of clubs or organizations: More than 4 times per year    Relationship status: Married  Other Topics Concern   Not on file  Social History Narrative   The patient is married and has 6 children   Operates a business   No alcohol tobacco or drug use    Outpatient Encounter Medications as of  03/19/2019  Medication Sig   allopurinol (ZYLOPRIM) 300 MG tablet TAKE 1 TABLET BY MOUTH EVERY DAY   amLODipine-benazepril (LOTREL) 5-10 MG capsule Take 1 capsule by mouth daily.   aspirin EC 81 MG tablet Take 81 mg by mouth daily.   atenolol-chlorthalidone (TENORETIC) 50-25 MG tablet TAKE 1 ATBLET BY MOUTH DAILY AT 11 AM   Dulaglutide (TRULICITY) 8.31 DV/7.6HY SOPN INJECT 0.5 ML(0.75 MG)  SUBCUTANEOUSLY EVERY WEEK IN ABDOMEN, THIGH OR UPPER ARM ROTATING INJECTION SITE   MELATONIN PO Take 2 tablets by mouth at bedtime as needed (For sleep.).   ONETOUCH DELICA LANCETS 31V MISC Use to check blood glucose once daily   Probiotic Product (PROBIOTIC DAILY PO) Take 3 capsules by mouth daily.   rosuvastatin (CRESTOR) 20 MG tablet TAKE 1 TABLET BY MOUTH ONCE DAILY   No facility-administered encounter medications on file as of 03/19/2019.     Activities of Daily Living In your present state of health, do you have any difficulty performing the following activities: 03/19/2019  Hearing? N  Vision? Y  Comment left eye cataract  Difficulty concentrating or making decisions? N  Walking or climbing stairs? N  Dressing or bathing? N  Doing errands, shopping? N  Preparing Food and eating ? N  Using the Toilet? N  In the past six months, have you accidently leaked urine? Y  Comment wear pull ups  Do you have problems with loss of bowel control? N  Managing your Medications? N  Managing your Finances? N  Housekeeping or managing your Housekeeping? N  Some recent data might be hidden    Patient Care Team: [name], MD as PCP - General (Internal Medicine) [name] as Social Worker [place], Claudette Stapler, RN as Case Manager [place], Royce Macadamia, [facility] as Pharmacist (Pharmacist)    Assessment:   This is a routine wellness examination for [place].  Exercise Activities and Dietary recommendations Current Exercise Habits: The patient does not participate in regular exercise at present  Goals      "I need some dental work" (pt-stated)     Current Barriers:   Presenter, broadcasting education about health plan benefits  Clinical Social Work Clinical Goal(s):   Over the next 60 days, client will follow up with an in network dental provider as directed by SW  Interventions:  Patient interviewed and appropriate assessments performed  Discussed plans with patient for ongoing care management follow up and provided patient with direct contact information for care management team  Assisted patient/caregiver with obtaining information about health plan benefits   Ordered a catalog of in network providers to be shipped to the patients home  Advised the patient to contact an in Database administrator upon receipt of [place] manager with CCM RN Case Manager Angel Little to update on patients stated goal and patients concern that she is experiencing gastrointestinal symptoms from tooth drainage  Patient Self Care Activities:   Attends all scheduled provider appointments  Calls pharmacy for medication refills  Calls provider office for new concerns or questions  Initial goal documentation      "I want to get my sugar under control" (pt-stated)     Current Barriers:   Knowledge Deficits related to DM disease management. Mrs. Frigon states she doesn't like having to check her blood sugar or take medication for diabetes so she wants to work hard to get her blood sugar under control. We discussed the value of good glucose management whether she needs to stay on medication long term. She is very eager to learn about self health management strategies related to DM.   Nurse Case Manager Clinical Goal(s):   Over the next 30 days, patient will verbalize understanding of plan for self health management strategies related to diabetes.    Over the next 14 days, patient will activate nutrition strategies to promote improved cbg control: manage portion sizes utilizing plate  method, journal cbg  response to meals, increase vegetable intake  Over the next 14 days, patient will maintain or improve daily exercise/walking regimen  Interventions:   Evaluation of current treatment plan related to DM  and patient's adherence to plan as established by provider.  Advised patient to adhere to prescribed medication regimen, prescribed carb modified/heart healthy/low sodium diet, prescribed exercise regimen.   Provided education to patient re: carb modified diet, appropriate exercise regimen. Patient has taken up habit of walking (measuring daily steps) in her home.   Discussed plans with patient for ongoing care management follow up and provided patient with direct contact information for care management team  Patient Self Care Activities:   Currently UNABLE TO independently manage DM  Self administers medications as prescribed  Attends all scheduled provider appointments  Calls pharmacy for medication refills  Attends church or other social activities  Performs ADL's independently  Performs IADL's independently  Calls provider office for new concerns or questions  Initial goal documentation      "I want to learn more about my insurance benefits" (pt-stated)     Current Barriers:   Limited understanding in summary of benefits  Difficulty in understanding customer service representative  Unclear on current dental benefit package  Clinical Social Work Clinical Goal(s):   Over the next 30 days, patient will work with SW to address concerns related to the patients knowledge of payor benefits   Over the next 45 days, patient will be knowledgeable of her current dental package  Interventions:  Telephonic follow up by CCM SW   Assisted the patient in contacting her health plan to determine dental and vision benefits  Reinforced health plan benefits with the patient explaining in laymans terms  Requested a booklet indicating in network providers be  mailed to the patients home  Educated the patient on her over the counter benefits under her health plan  Advised the patient she is eligible for a life alert at no cost - see new goal  Encouraged the patient to utilize her over the counter benefit as a way to save money on out of pocket expenses for particular items  Patient Self Care Activities:   Self administers medications as prescribed  Calls pharmacy for medication refills  Calls provider office for new concerns or questions  Please see past updates related to this goal by clicking on the "Past Updates" button in the selected goal       "I want to order a life alert" (pt-stated)     Current Barriers:   Financial constraints   Limited knowledge of health plan benefit  Clinical Social Work Clinical Goal(s):   Over the next 30 days, client will work with SW to address concerns related to accessing a life alert pendant  Interventions:  Telephonic follow up by CCM SW  Determined the patient has received life alert system in the mail provided by her health plan  Encouraged the patient to contact her health plan to assist with set-up  Determined the patient would like a system that works both inside and outside of the home - unfortunately, the system covered under the patients health plan only works inside the home  Educated the patient on the [place] program  Provided the patient with the contact number to the [place] program and encouraged her to follow up with [facility] regarding enrollment  Patient Self Care Activities:   Self administers medications as prescribed  Attends all scheduled provider appointments  Calls provider office for  new concerns or questions  Please see past updates related to this goal by clicking on the "Past Updates" button in the selected goal       Assist with Disease Management and Care Coordination      Current Barriers:   Knowledge Barriers related to resources and  support available to address needs related to disease management and community resources  Case Manager Clinical Goal(s):   Over the next 30 days, patient will work with BSW to address needs related to disease management of specified comorbidities and follow up on community and pharmacological resources provided.   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  It places patient in the coverage gap and monthly copay is too high.  Patient has been getting samples via PCP office most recently.  Pharmacist Clinical Goal(s):   Over the next 14 days, patient will work with CCM Pharmacist to address needs related to applying for financial assistance (Trulicity)  Interventions:  Comprehensive medication review performed.  Advised patient to continue taking medications as prescribed, checking BGs & eating a DM healthy diet.  Discussed plans with patient for ongoing care management follow up and provided patient with direct contact information for care management team   Will meet with patient in provider's office on 03/19/19 to complete paperwork for [employer] Patient assistance program to obtain Trulicity.  Patient verbalizes understanding and would like to proceed with application.  Patient Self Care Activities:   Self administers medications as prescribed  Attends all scheduled provider appointments  Calls pharmacy for medication refills  Initial goal documentation      I would like  to keep my blood sugar at goal & maintain my A1c (pt-stated)     Current Barriers:   Financial  COVID-19-->patient is not as active due to some COVID-19 restrictions.  She reports eating more at home, however her BGs readings have been mostly at goal.  Pharmacist Clinical Goal(s):   Over the next 30 days, patient will demonstrate Improved medication adherence as evidenced by blood sugar & A1C labs at goal.    Interventions:  Comprehensive medication review performed.  Discussed plans with patient for ongoing care management follow up and provided patient with direct contact information for care management team   Counseled patient on S/SX of hypoglycemia (patientdenies BG<70)  Patient reports FBGs of 100-129.  Encouraged patient to continue the great work!  She reports eating more since she is staying at home now.  She continues to check her BGs as directed.  She reports having functional glucometer & supplies. She continues to get samples of Trulicity from PCP, but we will be assisting patient with applying for assistance.  Patient Self Care Activities:   Self administers medications as prescribed  Attends all scheduled provider appointments  Calls pharmacy for medication refills  Initial goal documentation        Fall Risk Fall Risk  03/19/2019 03/19/2019 02/17/2019 12/10/2018 10/08/2018  Falls in the past year? 0 0 0 0 0  Risk for fall due to : Medication  side effect - - - -  Follow up Education provided;Falls prevention discussed - - - -   Is the patient's home free of loose throw rugs in walkways, pet beds, electrical cords, etc?   yes      Grab bars in the bathroom? yes      Handrails on the stairs?   yes      Adequate lighting?   yes  Timed Get Up and Go performed: n/a  Depression Screen PHQ 2/9 Scores 03/19/2019 03/19/2019 02/17/2019 02/05/2019  PHQ - 2 Score 0 0 0 0  PHQ- 9 Score 0 - - -     Cognitive Function     6CIT Screen 03/19/2019  What Year? 0 points    What month? 0 points  What time? 0 points  Count back from 20 0 points  Months in reverse 0 points  Repeat phrase 2 points  Total Score 2    Immunization History  Administered Date(s) Administered   Influenza Split 07/25/2012   Influenza, High Dose Seasonal PF 10/08/2018   Influenza,inj,Quad PF,6+ Mos 11/21/2013   Pneumococcal Polysaccharide-23 11/21/2013   Td 01/29/1999, 11/21/2013    Qualifies for Shingles Vaccine? yes  Screening Tests Health Maintenance  Topic Date Due   FOOT EXAM  05/06/1951   OPHTHALMOLOGY EXAM  05/06/1951   COLONOSCOPY  12/23/2018   INFLUENZA VACCINE  05/31/2019   HEMOGLOBIN A1C  06/10/2019   TETANUS/TDAP  11/22/2023   DEXA SCAN  Completed   PNA vac Low Risk Adult  Completed    Cancer Screenings: Lung: Low Dose CT Chest recommended if Age 68-80 years, 30 pack-year currently smoking OR have quit w/in 15years. Patient does not qualify. Breast:  Up to date on Mammogram? No   Up to date of Bone Density/Dexa? Yes Colorectal: up to date  Additional Screenings: : Hepatitis C Screening: n/a     Plan:   Mammogram on hold due to quarantine.  I have personally reviewed and noted the following in the patients chart:    Medical and social history  Use of alcohol, tobacco or illicit drugs   Current medications and supplements  Functional ability and status  Nutritional status  Physical activity  Advanced directives  List of other physicians  Hospitalizations, surgeries, and ER visits in previous 12 months  Vitals  Screenings to include cognitive, depression, and falls  Referrals and appointments  In addition, I have reviewed and discussed with patient certain preventive protocols, quality metrics, and best practice recommendations. A written personalized care plan for preventive services as well as general preventive health recommendations were provided to patient.     Kellie Simmering, LPN  1/61/0960

## 2019-03-19 NOTE — Telephone Encounter (Signed)
The pt was notified that her cologuard results was neg.

## 2019-03-20 ENCOUNTER — Telehealth: Payer: Self-pay

## 2019-03-20 ENCOUNTER — Other Ambulatory Visit: Payer: Self-pay | Admitting: Pharmacy Technician

## 2019-03-20 LAB — HEMOGLOBIN A1C
Est. average glucose Bld gHb Est-mCnc: 137 mg/dL
Hgb A1c MFr Bld: 6.4 % — ABNORMAL HIGH (ref 4.8–5.6)

## 2019-03-20 LAB — URIC ACID: Uric Acid: 3.9 mg/dL (ref 2.5–7.1)

## 2019-03-20 NOTE — Patient Instructions (Signed)
Visit Information  Goals Addressed            This Visit's Progress     Patient Stated   . I would like financial assistance with my Trulicity (pt-stated)       Current Barriers:  . Financial Barriers-patient cannot afford Trulicity through insurance.  It places patient in the coverage gap and monthly copay is too high.  Patient has been getting samples via PCP office most recently.  Pharmacist Clinical Goal(s):  Marland Kitchen Over the next 14 days, patient will work with CCM Pharmacist to address needs related to applying for financial assistance (Trulicity)  Interventions: . Comprehensive medication review performed. . Advised patient to continue taking medications as prescribed, checking BGs & eating a DM healthy diet. . Discussed plans with patient for ongoing care management follow up and provided patient with direct contact information for care management team  . Met with patient in provider's office on 03/19/19 to complete paperwork for [employer] Patient assistance program to obtain Trulicity.  Patient verbalizes understanding and would like to proceed with application. Patient needs to mail in her proof of income.  Will send provider portion. . Counseled on injection technique.  Patient reports compliance and denies adverse events.  Patient Self Care Activities:  . Self administers medications as prescribed . Attends all scheduled provider appointments . Calls pharmacy for medication refills  Please see past updates related to this goal by clicking on the "Past Updates" button in the selected goal      . I would like to keep my blood sugar at goal & maintain my A1c (pt-stated)       Current Barriers:  . Financial . COVID-19-->patient is not as active due to some COVID-19 restrictions.  She reports eating more at home, however her BGs readings have been mostly at goal.  Pharmacist Clinical Goal(s):  Marland Kitchen Over the next 30 days, patient will demonstrate Improved medication adherence as  evidenced by blood sugar & A1C labs at goal.    Interventions: . Comprehensive medication review performed. . Discussed plans with patient for ongoing care management follow up and provided patient with direct contact information for care management team  . Counseled patient on S/SX of hypoglycemia (patient denies BG<70) . Patient reports FBGs of 100-129.  Encouraged patient to continue the great work!  She reports eating more since she is staying at home now.  She continues to check her BGs as directed.  She reports having functional glucometer & supplies. She continues to get samples of Trulicity from PCP, but we will be assisting patient with applying for assistance. . Patient has also added a new OTC supplement called "Blood Balance Formula"-contains vitamins C,E, zinc, magnesium, chromium, & botanical blends.  Ingredients do not appear to be harmful or interact, however this medication has not been FDA approved.  Patient states she will continue to take as it has helped her BGs stay within range.  Patient Self Care Activities:  . Self administers medications as prescribed . Attends all scheduled provider appointments . Calls pharmacy for medication refills  Please see past updates related to this goal by clicking on the "Past Updates" button in the selected goal         The patient verbalized understanding of instructions provided today and declined a print copy of patient instruction materials.   The CM team will reach out to the patient again over the next 30 days.   Regina Eck, PharmD, [place] Clinical Pharmacist, [facility]  [place]  Direct Dial: [phone]

## 2019-03-20 NOTE — Progress Notes (Signed)
Chronic Care Management   Visit Note  03/19/2019 Name: [name] MRN: [id number] DOB: 03-03-1941  Referred by: [name], MD Reason for referral : Chronic Care Management   [name] is a 78 y.o. year old female who is a primary care patient of [name], MD. The CCM team was consulted for assistance with chronic disease management and care coordination needs.   Review of patient status, including review of consultants reports, relevant laboratory and other test results, and collaboration with appropriate care team members and the patient's provider was performed as part of comprehensive patient evaluation and provision of chronic care management services.    I met with [name] today in person.  Objective:   Goals Addressed            This Visit's Progress     Patient Stated   . I would like financial assistance with my Trulicity (pt-stated)       Current Barriers:  . Financial Barriers-patient cannot afford Trulicity through insurance.  It places patient in the coverage gap and monthly copay is too high.  Patient has been getting samples via PCP office most recently.  Pharmacist Clinical Goal(s):  Marland Kitchen Over the next 14 days, patient will work with CCM Pharmacist to address needs related to applying for financial assistance (Trulicity)  Interventions: . Comprehensive medication review performed. . Advised patient to continue taking medications as prescribed, checking BGs & eating a DM healthy diet. . Discussed plans with patient for ongoing care management follow up and provided patient with direct contact information for care management team  . Met with patient in provider's office on 03/19/19 to complete paperwork for [employer] Patient assistance program to obtain Trulicity.  Patient verbalizes understanding and would like to proceed with application. Patient needs to mail in her proof of income.  Will send provider portion. . Counseled on injection  technique.  Patient reports compliance and denies adverse events.  Patient Self Care Activities:  . Self administers medications as prescribed . Attends all scheduled provider appointments . Calls pharmacy for medication refills  Please see past updates related to this goal by clicking on the "Past Updates" button in the selected goal      . I would like to keep my blood sugar at goal & maintain my A1c (pt-stated)       Current Barriers:  . Financial . COVID-19-->patient is not as active due to some COVID-19 restrictions.  She reports eating more at home, however her BGs readings have been mostly at goal.  Pharmacist Clinical Goal(s):  Marland Kitchen Over the next 30 days, patient will demonstrate Improved medication adherence as evidenced by blood sugar & A1C labs at goal.    Interventions: . Comprehensive medication review performed. . Discussed plans with patient for ongoing care management follow up and provided patient with direct contact information for care management team  . Counseled patient on S/SX of hypoglycemia (patient denies BG<70) . Patient reports FBGs of 100-129.  Encouraged patient to continue the great work!  She reports eating more since she is staying at home now.  She continues to check her BGs as directed.  She reports having functional glucometer & supplies. She continues to get samples of Trulicity from PCP, but we will be assisting patient with applying for assistance. . Patient has also added a new OTC supplement called "Blood Balance Formula"-contains vitamins C,E, zinc, magnesium, chromium, & botanical blends.  Ingredients do not appear to be harmful or interact, however this  medication has not been FDA approved.  Patient states she will continue to take as it has helped her BGs stay within range.  Patient Self Care Activities:  . Self administers medications as prescribed . Attends all scheduled provider appointments . Calls pharmacy for medication refills  Please see  past updates related to this goal by clicking on the "Past Updates" button in the selected goal       Plan:   The CM team will reach out to the patient again over the next 30 days.   Regina Eck, PharmD, BCPS Clinical Pharmacist, [facility] [place]: [phone]

## 2019-03-20 NOTE — Patient Outreach (Signed)
[place] [facility]) Care Management  03/20/2019  Reshma Hoey October 16, 1941 [id number]                          Medication Assistance Referral  Referral From: [facility] RPh Jenne Pane [facility] R[facility])  Medication/Company: Danelle Berry / [place] Patient application portion:  N/A patient signed in office/ mailing return envelope for patients proof of income Provider application portion: Faxed  to Dr. Baird [facility]   Follow up:  Will collaborate with [name] on follow up with patient in 7-10 business days to confirm application(s) have been received.  [name] [name] [place] Certified Pharmacy Technician [place] Management Direct Dial:[phone]

## 2019-03-20 NOTE — Telephone Encounter (Signed)
The pt was notified of her most recent lab results.

## 2019-03-22 ENCOUNTER — Other Ambulatory Visit: Payer: Self-pay | Admitting: Internal Medicine

## 2019-03-22 NOTE — Progress Notes (Signed)
Subjective:     Patient ID: [name] , female    DOB: 1941/07/24 , 78 y.o.   MRN: [id number]   Chief Complaint  Patient presents with  . Diabetes  . Hypertension    HPI  Diabetes  She presents for her follow-up diabetic visit. She has type 2 diabetes mellitus. Her disease course has been stable. There are no hypoglycemic associated symptoms. Pertinent negatives for diabetes include no blurred vision and no chest pain. There are no hypoglycemic complications. Diabetic complications include nephropathy. Risk factors for coronary artery disease include diabetes mellitus, dyslipidemia, hypertension, obesity, sedentary lifestyle and post-menopausal. She is compliant with treatment most of the time. Her weight is decreasing steadily. She is following a diabetic diet. She participates in exercise intermittently. Her home blood glucose trend is fluctuating minimally. An ACE inhibitor/angiotensin II receptor blocker is being taken.  Hypertension  This is a chronic problem. The current episode started more than 1 year ago. The problem is unchanged. The problem is controlled. Pertinent negatives include no blurred vision, chest pain, palpitations or shortness of breath. Risk factors for coronary artery disease include diabetes mellitus, dyslipidemia, obesity and sedentary lifestyle. Compliance problems include exercise.      Past Medical History:  Diagnosis Date  . Abdominal pain   . Arthritis    cervical disc degeneration/ oa left knee, carpal tunnel rt wrist, adhesive capsulitis right shoulder, rt hand weakness; lumbar degeneration  . Carpal tunnel syndrome   . Colon adenoma   . 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 Constipation   . Diabetes mellitus without complication ([place])    borderline - diet control  .  Diverticulitis hx of  . Diverticulosis   . Frequent UTI    hx of urethral injury during colon surgery - states frequent uti's since  . GERD (gastroesophageal reflux disease)   . H/O hiatal hernia   . History of palpitations    in the past  . History of shingles    has a lingering itching on back where shingles were  . Hyperlipidemia   . Hypertension   . Numbness and tingling in right hand    pt. states has numbness of right hand very frequently-watch positioning  . Obesity   . Osteoarthritis   . Osteoporosis   . Pain    pain left knee and pain right hip and right groin  . Personal history of colonic polyps-adenoma 08/26/2008  . Pneumonia 2005  . Vitamin D deficiency      Family History  Problem Relation Age of Onset  . Breast cancer Mother 6  . Hypertension Mother   . Prostate cancer Father   . Colon cancer Father 37  . Hypertension Father   . Dementia Sister   . Lung cancer Brother   . Hypertension Brother   . COPD Brother   . Cancer Maternal Grandmother   . Arthritis Sister   . Hypertension Sister   . Arthritis Sister   . Hypertension Child   . Hypertension Child   . Hypertension Child      Current Outpatient Medications:  .  allopurinol (ZYLOPRIM) 300 MG tablet, TAKE 1 TABLET BY MOUTH EVERY DAY, Disp: 90 tablet, Rfl: 0 .  amLODipine-benazepril (LOTREL) 5-10 MG capsule, Take 1 capsule by mouth daily., Disp: 90 capsule, Rfl: 1 .  aspirin EC 81 MG tablet, Take 81 mg by mouth daily., Disp: , Rfl:  .  atenolol-chlorthalidone (TENORETIC) 50-25 MG tablet, TAKE 1 ATBLET BY MOUTH DAILY AT 11 AM, Disp: 90 tablet, Rfl: 0 .  Dulaglutide (TRULICITY) 6.22 WL/7.9GX SOPN, INJECT 0.5 ML(0.75 MG) SUBCUTANEOUSLY EVERY WEEK IN ABDOMEN, THIGH OR UPPER ARM ROTATING INJECTION SITE, Disp: 2 mL, Rfl: 0 .  MELATONIN PO, Take 2 tablets by mouth at bedtime as needed (For sleep.)., Disp: , Rfl:  .  NON FORMULARY, Blood balance formula Ordered from Dr. Irena Cords 1 capsule daily., Disp: , Rfl:  .   ONETOUCH DELICA LANCETS 21J MISC, Use to check blood glucose once daily, Disp: 100 each, Rfl: 3 .  Probiotic Product (PROBIOTIC DAILY PO), Take 3 capsules by mouth daily., Disp: , Rfl:  .  rosuvastatin (CRESTOR) 20 MG tablet, TAKE 1 TABLET BY MOUTH ONCE DAILY, Disp: 90 tablet, Rfl: 0   Allergies  Allergen Reactions  . Atorvastatin Calcium Itching    Other reaction(s): Itching  . Bee Venom Hives  . Lipitor [Atorvastatin Calcium] Itching  . Oxycodone Other (See Comments)    hallucinations  . Vicodin [Hydrocodone-Acetaminophen] Other (See Comments)    hallucinations     Review of Systems  Constitutional: Negative.   Eyes: Negative for blurred vision.  Respiratory: Negative.  Negative for shortness of breath.   Cardiovascular: Negative.  Negative for chest pain and palpitations.  Gastrointestinal: Negative.   Neurological: Negative.   Psychiatric/Behavioral: Negative.      Today's Vitals   03/19/19 1121  BP: 130/74  Pulse: 62  Temp: 98.5 F (36.9 C)  TempSrc: Oral  Weight: 242 lb 9.6 oz (110 kg)  Height: 5' 4.6" (1.641 m)  PainSc: 0-No pain   Body mass index is 40.87 kg/m.   Objective:  Physical Exam Vitals signs and nursing note reviewed.  Constitutional:      Appearance: Normal appearance.  HENT:     Head: Normocephalic and atraumatic.  Cardiovascular:     Rate and Rhythm: Normal rate and regular rhythm.     Heart sounds: Normal heart sounds.  Pulmonary:     Effort: Pulmonary effort is normal.     Breath sounds: Normal breath sounds.  Skin:    General: Skin is warm.     Comments: She did have laceration of left index finger, bandage removed - no drainage.   Neurological:     General: No focal deficit present.     Mental Status: She is alert.  Psychiatric:        Mood and Affect: Mood normal.        Behavior: Behavior normal.         Assessment And Plan:     1. Type 2 diabetes mellitus with stage 3 chronic kidney disease, without long-term current use  of insulin (HCC)  I will check hba1c and adjust meds as needed.  She is now taking diabetic supplement, ingredients reviewed - appears to be MVI with alpha lipoic acid, chromium, etc. Pt advised that she should not have any AE from this, she reports she feels better with this. She will continue with Trulicity, does not wish to increase dose at this time. She is encouraged to aim for four days of exercise, working up to 30 minutes.   - Hemoglobin A1c - Hemoglobin A1c  2. Hypertensive nephropathy  Controlled. She will continue with current meds. She is encouraged to avoid adding salt to her foods.   3. Chronic gout without  tophus, unspecified cause, unspecified site  Chronic I will check uric acid level. She will continue with allopurinol for now. If controlled, will consider lowering dose to 200mg  daily.  - Uric acid  4. Class 3 severe obesity due to excess calories with serious comorbidity and body mass index (BMI) of 40.0 to 44.9 in adult [facility])  Importance of achieving optimal weight to decrease risk of cardiovascular disease and cancers was discussed with the patient in full detail. She is encouraged to start slowly - start with 10 minutes twice daily at least three to four days per week and to gradually build to 30 minutes five days weekly. She was given tips to incorporate more activity into her daily routine - take stairs when possible, park farther away from her grocery stores, etc.    5. Laceration of finger without foreign body without damage to nail, unspecified finger, unspecified laterality, initial encounter  Her bandage was removed, area was cleaned. I applied steri-strips to the laceration. There was good approximation of both edges. Pt advised to keep on there for another three days.    Maximino Greenland, MD    THE PATIENT IS ENCOURAGED TO PRACTICE SOCIAL DISTANCING DUE TO THE COVID-19 PANDEMIC.

## 2019-03-25 ENCOUNTER — Ambulatory Visit: Payer: Self-pay

## 2019-03-25 DIAGNOSIS — I10 Essential (primary) hypertension: Secondary | ICD-10-CM | POA: Diagnosis not present

## 2019-03-25 DIAGNOSIS — I129 Hypertensive chronic kidney disease with stage 1 through stage 4 chronic kidney disease, or unspecified chronic kidney disease: Secondary | ICD-10-CM

## 2019-03-25 DIAGNOSIS — N183 Chronic kidney disease, stage 3 unspecified: Secondary | ICD-10-CM

## 2019-03-25 DIAGNOSIS — E1122 Type 2 diabetes mellitus with diabetic chronic kidney disease: Secondary | ICD-10-CM | POA: Diagnosis not present

## 2019-03-25 NOTE — Chronic Care Management (AMB) (Signed)
  Chronic Care Management    Clinical Social Work Follow Up Note  03/25/2019 Name: Ortha Metts MRN: [id number] DOB: 1940-11-02  Franki Alcaide is a 79 y.o. year old female who is a primary care patient of [name], MD. The CCM team was consulted for assistance with [employer].   Successful outbound call to the patient to assess progress of patient stated SW goals. While assessing the patients progress in obtaining a life alert the patient requested to contact the SW at a later time.   Follow Up Plan: SW will outreach the patient over the next 7-10 days if a return call is not received.   [name], BSW, CDP Social Worker, Certified Dementia Practitioner [place] [facility] [place] Management [phone]  Total time spent performing care coordination and/or care management activities with the patient by phone or face to face = 5 minutes.

## 2019-03-28 ENCOUNTER — Other Ambulatory Visit: Payer: Self-pay

## 2019-03-28 ENCOUNTER — Ambulatory Visit: Payer: Self-pay

## 2019-03-28 ENCOUNTER — Telehealth: Payer: Self-pay

## 2019-03-28 DIAGNOSIS — I129 Hypertensive chronic kidney disease with stage 1 through stage 4 chronic kidney disease, or unspecified chronic kidney disease: Secondary | ICD-10-CM

## 2019-03-28 DIAGNOSIS — N183 Chronic kidney disease, stage 3 unspecified: Secondary | ICD-10-CM

## 2019-03-28 DIAGNOSIS — E1122 Type 2 diabetes mellitus with diabetic chronic kidney disease: Secondary | ICD-10-CM | POA: Diagnosis not present

## 2019-03-28 DIAGNOSIS — I10 Essential (primary) hypertension: Secondary | ICD-10-CM | POA: Diagnosis not present

## 2019-03-28 MED ORDER — ATENOLOL-CHLORTHALIDONE 50-25 MG PO TABS: ORAL_TABLET | ORAL | 0 refills | Status: DC

## 2019-03-28 NOTE — Telephone Encounter (Signed)
Called pt to see how she was doing after her injury to her left index finger. Pt states that she is doing well. Encouraged to call the office for any other needs or concerns.

## 2019-03-31 ENCOUNTER — Encounter: Payer: Self-pay | Admitting: Internal Medicine

## 2019-03-31 NOTE — Patient Instructions (Signed)
Visit Information  Goals Addressed      Patient Stated   . "I need some dental work" (pt-stated)       Current Barriers:  . Film/video editor  . Limited education about health plan benefits  Clinical Social Work Clinical Goal(s):  Marland Kitchen Over the next 60 days, client will follow up with an in network dental provider as directed by SW  Interventions: . Patient interviewed and appropriate assessments performed . Discussed plans with patient for ongoing care management follow up and provided patient with direct contact information for care management team . Assisted patient/caregiver with obtaining information about health plan benefits  . Ordered a catalog of in network providers to be shipped to the patients home . Advised the patient to contact an in network dentist upon receipt of catalog . Collaboration with CCM RN Case Electronics engineer Abbas Beyene to update on patients stated goal and patients concern that she is experiencing gastrointestinal symptoms from tooth drainage  CCM RN CM Interventions:  Completed with patient on 03/31/19:    Comprehensive assessment completed to assess for potential infection related to dental abscess and or decay - pt denies s/s suggestive of infection related to unresolved dental issues  Assessed for current treatment status for tooth decay/dental work - pt contacted health plan to learn she has basic dental coverage, she will have to pay out of pocket for maintenance dental work - pt is requesting resources to help cover the cost   Sent in-basket message to embedded BSW [name] requesting she f/u on any available resources that may help cover dental expenses  Discussed plans with patient for ongoing care management follow up and provided patient with direct contact information for care management team  Patient Self Care Activities:  . Attends all scheduled provider appointments . Calls pharmacy for medication refills . Calls provider office for new  concerns or questions  Please see past updates related to this goal by clicking on the "Past Updates" button in the selected goal      . "I want to get my sugar under control" (pt-stated)       Current Barriers:  Marland Kitchen Knowledge Deficits related to DM disease management. Mrs. Cantrall states she doesn't like having to check her blood sugar or take medication for diabetes so she wants to work hard to get her blood sugar under control. We discussed the value of good glucose management whether she needs to stay on medication long term. She is very eager to learn about self health management strategies related to DM.   Nurse Case Manager Clinical Goal(s):  Marland Kitchen Over the next 90 days, patient will verbalize understanding of plan for self health management strategies related to diabetes.  03/31/19 re-establish goal date due to treatment delays from COVID-19 . Over the next 14 days, patient will activate nutrition strategies to promote improved cbg control: manage portion sizes utilizing plate method, journal cbg response to meals, increase vegetable intake  03/31/19 Goal Met . Over the next 14 days, patient will maintain or improve daily exercise/walking regimen  03/31/19 Goal Met  CCM RN CM Interventions:  Completed with patient on 03/31/19:    Comprehensive assessment completed . Evaluation of current treatment plan related to DM and patient's adherence to plan as established by provider  Assessed for patient adherence to self monitoring FBGs - pt checks daily most days - reports BG range from 107 to highest 130's  Reinforced importance of following a home exercise plan - pt continues to  walk and implement some sort of activity in her daily routine  Printed diabetes education materials to be mailed to patient for review; Diabetes meal planning using the plate method, know your A1C, s/s of hypo/hyperglycemia   Discussed target A1C levels as part of diabetes/prediabetes disease process   Discussed target  BP of 130/80 - discussed patient's last OV BP was 130/74 . Discussed plans with patient for ongoing care management follow up and provided patient with direct contact information for care management team  Patient Self Care Activities:  . Currently UNABLE TO independently manage DM . Self administers medications as prescribed . Attends all scheduled provider appointments . Calls pharmacy for medication refills . Attends church or other social activities . Performs ADL's independently . Performs IADL's independently . Calls provider office for new concerns or questions  Please see past updates related to this goal by clicking on the "Past Updates" button in the selected goal      . "I want to order a life alert" (pt-stated)       Current Barriers:  . Financial constraints  . Limited knowledge of health plan benefit  Clinical Social Work Clinical Goal(s):  Marland Kitchen Over the next 90 days, client will work with SW to address concerns related to accessing a life alert pendant 03/31/19 re-established target goal date due to treatment delays secondary to COVID-19  Interventions:  Telephonic follow up by CCM SW  Determined the patient has received life alert system in the mail provided by her health plan  Encouraged the patient to contact her health plan to assist with set-up  Determined the patient would like a system that works both inside and outside of the home - unfortunately, the system covered under the patients health plan only works inside the home  Educated the patient on the [place] program  Provided the patient with the contact number to the [place] program and encouraged her to follow up with [facility] regarding enrollment  CCM RN CM Interventions:  Completed call with patient on 03/31/19:    Assessed for home safety concerns  Assessed for receipt and use of emergency alert system - patient needs a system that works both inside and outside of the home - reports she  cannot afford to pay a monthly service fee during [place] times  Assessed for patient outreach to [facility] regarding enrollment to the Med Alert program - pt has not contacted and has asked to be given the contact information again   Advised patient BSW [name] will follow up with her   Sent in-basket message to embedded BSW [name] requesting she follow up with patient   Discussed plans with patient for ongoing care management follow up and provided patient with direct contact information for care management team  Patient Self Care Activities:  . Self administers medications as prescribed . Attends all scheduled provider appointments . Calls provider office for new concerns or questions  Please see past updates related to this goal by clicking on the "Past Updates" button in the selected goal     . I would like financial assistance with my Trulicity (pt-stated)       Current Barriers:  . Financial Barriers-patient cannot afford Trulicity through insurance.  It places patient in the coverage gap and monthly copay is too high.  Patient has been getting samples via PCP office most recently.  Pharmacist Clinical Goal(s):  Marland Kitchen Over the next 14 days, patient will work with CCM Pharmacist to  address needs related to applying for financial assistance (Trulicity)  Interventions: . Comprehensive medication review performed. . Advised patient to continue taking medications as prescribed, checking BGs & eating a DM healthy diet. . Discussed plans with patient for ongoing care management follow up and provided patient with direct contact information for care management team  . Met with patient in provider's office on 03/19/19 to complete paperwork for [employer] Patient assistance program to obtain Trulicity.  Patient verbalizes understanding and would like to proceed with application. Patient needs to mail in her proof of income.  Will send provider portion. . Counseled on injection technique.   Patient reports compliance and denies adverse events.  CCM RN CM Interventions:  Completed call with patient on 03/31/19:    Assessed for medication adherence   Discussed patient status for completion of mailing in proof of income to [employer] Patient assistance program in order to be considered for financial help with cost of Trulicity - patient states she was waiting on a form to be mailed by embedded PharmD Lottie Dawson and she has not received the form  Advised Lottie Dawson Pharm D will follow up with her to advise  Sent in-basket message to Lottie Dawson PharmD requesting she follow up with patient accordingly  Discussed plans with patient for ongoing care management follow up and provided patient with direct contact information for care management team  Patient Self Care Activities:  . Self administers medications as prescribed . Attends all scheduled provider appointments . Calls pharmacy for medication refills  Please see past updates related to this goal by clicking on the "Past Updates" button in the selected goal        Other   . COMPLETED: Assist with Disease Management and Care Coordination        Current Barriers:   Knowledge Barriers related to resources and support available to address needs related to disease management and community resources  Case Manager Clinical Goal(s):  Marland Kitchen Over the next 30 days, patient will work with BSW to address needs related to disease management of specified comorbidities and follow up on community and pharmacological resources provided. Goal Met  Interventions:  . Collaborated with BSW and initiated plan of care to address needs related to community resources and financial assistance for cost of Trulicity  Patient Self Care Activities:  . Self administers medications as prescribed . Performs ADL's independently . Performs IADL's independently . Calls provider office for new concerns or questions  Initial goal documentation        The patient verbalized understanding of instructions provided today and declined a print copy of patient instruction materials.   The care management team will reach out to the patient again over the next 14-21 days.    Barb Merino, RN,CCM Care Management Coordinator [place] [facility]  Direct Phone: [phone]   .

## 2019-03-31 NOTE — Chronic Care Management (AMB) (Signed)
Chronic Care Management   Follow Up Note   03/31/2019 Name: [name] MRN: [id number] DOB: 18-Apr-1941  Referred by: [name], MD Reason for referral : Chronic Care Management (CCM RN CM Telephone Follow Up)   [name] is a 78 y.o. year old female who is a primary care patient of [name], MD. The CCM team was consulted for assistance with chronic disease management and care coordination needs.    Review of patient status, including review of consultants reports, relevant laboratory and other test results, and collaboration with appropriate care team members and the patient's provider was performed as part of comprehensive patient evaluation and provision of chronic care management services.    I spoke with [name] by telephone today for a CCM follow-up.   Goals Addressed      Patient Stated   . "I need some dental work" (pt-stated)       Current Barriers:  . Film/video editor  . Limited education about health plan benefits  Clinical Social Work Clinical Goal(s):  Marland Kitchen Over the next 60 days, client will follow up with an in network dental provider as directed by SW  Interventions: . Patient interviewed and appropriate assessments performed . Discussed plans with patient for ongoing care management follow up and provided patient with direct contact information for care management team . Assisted patient/caregiver with obtaining information about health plan benefits  . Ordered a catalog of in network providers to be shipped to the patients home . Advised the patient to contact an in network dentist upon receipt of catalog . Collaboration with CCM RN Case Electronics engineer Sharmeka Palmisano to update on patients stated goal and patients concern that she is experiencing gastrointestinal symptoms from tooth drainage  CCM RN CM Interventions:  Completed with patient on 03/31/19:    Comprehensive assessment completed to assess for potential infection related to dental  abscess and or decay - pt denies s/s suggestive of infection related to unresolved dental issues  Assessed for current treatment status for tooth decay/dental work - pt contacted health plan to learn she has basic dental coverage, she will have to pay out of pocket for maintenance dental work - pt is requesting resources to help cover the cost   Sent in-basket message to embedded BSW [name] requesting she f/u on any available resources that may help cover dental expenses  Discussed plans with patient for ongoing care management follow up and provided patient with direct contact information for care management team  Patient Self Care Activities:  . Attends all scheduled provider appointments . Calls pharmacy for medication refills . Calls provider office for new concerns or questions  Please see past updates related to this goal by clicking on the "Past Updates" button in the selected goal      . "I want to get my sugar under control" (pt-stated)       Current Barriers:  Marland Kitchen Knowledge Deficits related to DM disease management. [name] states she doesn't like having to check her blood sugar or take medication for diabetes so she wants to work hard to get her blood sugar under control. We discussed the value of good glucose management whether she needs to stay on medication long term. She is very eager to learn about self health management strategies related to DM.   Nurse Case Manager Clinical Goal(s):  Marland Kitchen Over the next 90 days, patient will verbalize understanding of plan for self health management strategies related to diabetes.  03/31/19 re-establish goal  date due to treatment delays from COVID-19 . Over the next 14 days, patient will activate nutrition strategies to promote improved cbg control: manage portion sizes utilizing plate method, journal cbg response to meals, increase vegetable intake  03/31/19 Goal Met . Over the next 14 days, patient will maintain or improve daily  exercise/walking regimen  03/31/19 Goal Met  CCM RN CM Interventions:  Completed with patient on 03/31/19:    Comprehensive assessment completed . Evaluation of current treatment plan related to DM and patient's adherence to plan as established by provider  Assessed for patient adherence to self monitoring FBGs - pt checks daily most days - reports BG range from 107 to highest 130's  Reinforced importance of following a home exercise plan - pt continues to walk and implement some sort of activity in her daily routine  Printed diabetes education materials to be mailed to patient for review; Diabetes meal planning using the plate method, know your A1C, s/s of hypo/hyperglycemia   Discussed target A1C levels as part of diabetes/prediabetes disease process   Discussed target BP of 130/80 - discussed patient's last OV BP was 130/74 . Discussed plans with patient for ongoing care management follow up and provided patient with direct contact information for care management team  Patient Self Care Activities:  . Currently UNABLE TO independently manage DM . Self administers medications as prescribed . Attends all scheduled provider appointments . Calls pharmacy for medication refills . Attends church or other social activities . Performs ADL's independently . Performs IADL's independently . Calls provider office for new concerns or questions  Please see past updates related to this goal by clicking on the "Past Updates" button in the selected goal      . "I want to order a life alert" (pt-stated)       Current Barriers:  . Financial constraints  . Limited knowledge of health plan benefit  Clinical Social Work Clinical Goal(s):  Marland Kitchen Over the next 90 days, client will work with SW to address concerns related to accessing a life alert pendant 03/31/19 re-established target goal date due to treatment delays secondary to COVID-19  Interventions:  Telephonic follow up by CCM SW   Determined the patient has received life alert system in the mail provided by her health plan  Encouraged the patient to contact her health plan to assist with set-up  Determined the patient would like a system that works both inside and outside of the home - unfortunately, the system covered under the patients health plan only works inside the home  Educated the patient on the [place] program  Provided the patient with the contact number to the [place] program and encouraged her to follow up with [facility] regarding enrollment  CCM RN CM Interventions:  Completed call with patient on 03/31/19:    Assessed for home safety concerns  Assessed for receipt and use of emergency alert system - patient needs a system that works both inside and outside of the home - reports she cannot afford to pay a monthly service fee during [place] times  Assessed for patient outreach to [facility] regarding enrollment to the Med Alert program - pt has not contacted and has asked to be given the contact information again   Advised patient BSW [name] will follow up with her   Sent in-basket message to embedded BSW [name] requesting she follow up with patient   Discussed plans with patient for ongoing care management follow up and  provided patient with direct contact information for care management team  Patient Self Care Activities:  . Self administers medications as prescribed . Attends all scheduled provider appointments . Calls provider office for new concerns or questions  Please see past updates related to this goal by clicking on the "Past Updates" button in the selected goal      . I would like financial assistance with my Trulicity (pt-stated)       Current Barriers:  . Financial Barriers-patient cannot afford Trulicity through insurance.  It places patient in the coverage gap and monthly copay is too high.  Patient has been getting samples via PCP office most recently.   Pharmacist Clinical Goal(s):  Marland Kitchen Over the next 14 days, patient will work with CCM Pharmacist to address needs related to applying for financial assistance (Trulicity)  Interventions: . Comprehensive medication review performed. . Advised patient to continue taking medications as prescribed, checking BGs & eating a DM healthy diet. . Discussed plans with patient for ongoing care management follow up and provided patient with direct contact information for care management team  . Met with patient in provider's office on 03/19/19 to complete paperwork for [employer] Patient assistance program to obtain Trulicity.  Patient verbalizes understanding and would like to proceed with application. Patient needs to mail in her proof of income.  Will send provider portion. . Counseled on injection technique.  Patient reports compliance and denies adverse events.  CCM RN CM Interventions:  Completed call with patient on 03/31/19:    Assessed for medication adherence   Discussed patient status for completion of mailing in proof of income to [employer] Patient assistance program in order to be considered for financial help with cost of Trulicity - patient states she was waiting on a form to be mailed by embedded PharmD Lottie Dawson and she has not received the form  Advised Lottie Dawson Pharm D will follow up with her to advise  Sent in-basket message to Lottie Dawson PharmD requesting she follow up with patient accordingly  Discussed plans with patient for ongoing care management follow up and provided patient with direct contact information for care management team   Patient Self Care Activities:  . Self administers medications as prescribed . Attends all scheduled provider appointments . Calls pharmacy for medication refills  Please see past updates related to this goal by clicking on the "Past Updates" button in the selected goal       Other   . COMPLETED: Assist with Disease Management and  Care Coordination        Current Barriers:   Knowledge Barriers related to resources and support available to address needs related to disease management and community resources  Case Manager Clinical Goal(s):  Marland Kitchen Over the next 30 days, patient will work with BSW to address needs related to disease management of specified comorbidities and follow up on community and pharmacological resources provided. Goal Met  Interventions:  . Collaborated with BSW and initiated plan of care to address needs related to community resources and financial assistance for cost of Trulicity  Patient Self Care Activities:  . Self administers medications as prescribed . Performs ADL's independently . Performs IADL's independently . Calls provider office for new concerns or questions  Initial goal documentation        The care management team will reach out to the patient again over the next 14-21 days.    Barb Merino, RN,CCM Care Management Coordinator [facility] Care Management/[facility]  Direct Phone: [phone]

## 2019-04-01 ENCOUNTER — Telehealth: Payer: Self-pay

## 2019-04-02 ENCOUNTER — Other Ambulatory Visit: Payer: Self-pay | Admitting: Pharmacy Technician

## 2019-04-02 ENCOUNTER — Ambulatory Visit: Payer: Self-pay

## 2019-04-02 ENCOUNTER — Telehealth: Payer: Self-pay

## 2019-04-02 DIAGNOSIS — N183 Chronic kidney disease, stage 3 unspecified: Secondary | ICD-10-CM

## 2019-04-02 DIAGNOSIS — E1122 Type 2 diabetes mellitus with diabetic chronic kidney disease: Secondary | ICD-10-CM

## 2019-04-02 NOTE — Patient Outreach (Signed)
[facility]) Care Management  04/02/2019  Quin Mcpherson 25-Feb-1941 [id number]   Received patient financial portion(s) of patient assistance application for Trulicity. Faxed completed application and required documents into [employer].  Will follow up with company in 2-3 business days to check status of application.  [name] [name] [place] Certified Pharmacy Technician [place] Management Direct Dial:[phone]

## 2019-04-02 NOTE — Chronic Care Management (AMB) (Signed)
  Chronic Care Management   Outreach Note  04/02/2019 Name: [name] MRN: [id number] DOB: [birth year]  Referred by: [name], MD Reason for referral : Care Coordination   An unsuccessful telephone outreach was attempted today to assist with dental resources. The patient was referred to the case management team by for assistance with chronic care management and care coordination.   Follow Up Plan: A HIPPA compliant phone message was left for the patient providing contact information and requesting a return call.  The care management team will reach out to the patient again over the next 7-10 days.   [name], BSW, CDP Social Worker, Certified Dementia Practitioner [place] / [place] Management [phone]  Total time spent performing care coordination and/or care management activities with the patient by phone or face to face = 5 minutes.

## 2019-04-09 ENCOUNTER — Other Ambulatory Visit: Payer: Self-pay | Admitting: Pharmacy Technician

## 2019-04-09 ENCOUNTER — Ambulatory Visit: Payer: Self-pay

## 2019-04-09 ENCOUNTER — Telehealth: Payer: Self-pay

## 2019-04-09 DIAGNOSIS — N183 Chronic kidney disease, stage 3 unspecified: Secondary | ICD-10-CM

## 2019-04-09 DIAGNOSIS — E1122 Type 2 diabetes mellitus with diabetic chronic kidney disease: Secondary | ICD-10-CM

## 2019-04-09 NOTE — Chronic Care Management (AMB) (Signed)
  Chronic Care Management   Outreach Note  04/09/2019 Name: [name] MRN: [id number] DOB: 1941-02-16  Referred by: [name], MD Reason for referral : Care Coordination   A second unsuccessful telephone outreach was attempted today to assess progression of patient stated goals. The patient was referred to the case management team for assistance with chronic care management and care coordination.   Follow Up Plan: A HIPPA compliant phone message was left for the patient providing contact information and requesting a return call.  The care management team will reach out to the patient again over the next 7-14 days.   [name], BSW, CDP Social Worker, Certified Dementia Practitioner [place] / [place] Management [phone]  Total time spent performing care coordination and/or care management activities with the patient by phone or face to face = 3 minutes.

## 2019-04-09 NOTE — Patient Outreach (Signed)
[facility]) Care Management  04/09/2019  [name] July 30, 1941 [id number]    Follow up call placed to [facility] regarding patient assistance application(s) for Trulicity , [name] confirms patient has been approved as of 6/9 until 10/30/19. I faxed script portion of application into [facility] pharmacy.  Follow up:  Will follow up with [facility] in 2-3 business days to confirm shipment has been scheduled.  [name] [name] [name] Certified Pharmacy Technician [place] Management Direct [phone]

## 2019-04-10 ENCOUNTER — Other Ambulatory Visit: Payer: Self-pay | Admitting: Pharmacy Technician

## 2019-04-10 ENCOUNTER — Ambulatory Visit (INDEPENDENT_AMBULATORY_CARE_PROVIDER_SITE_OTHER): Payer: Medicare Other | Admitting: Pharmacist

## 2019-04-10 DIAGNOSIS — N183 Chronic kidney disease, stage 3 unspecified: Secondary | ICD-10-CM

## 2019-04-10 DIAGNOSIS — I129 Hypertensive chronic kidney disease with stage 1 through stage 4 chronic kidney disease, or unspecified chronic kidney disease: Secondary | ICD-10-CM | POA: Diagnosis not present

## 2019-04-10 DIAGNOSIS — E1122 Type 2 diabetes mellitus with diabetic chronic kidney disease: Secondary | ICD-10-CM

## 2019-04-10 NOTE — Patient Outreach (Signed)
[facility]) Care Management  04/10/2019  Thelma Lorenzetti Jan 09, 1941 [id number]    Follow up call placed to [facility] [employer]) regarding patient assistance shipping details for Trulicity. Darnelle Maffucci states that [facility] has received prescription order. Per conversation with [place] Lottie Dawson, patient had mentioned wanting it shipped to another location besides her home. Informed [name] that patient would need to contact [facility] and set up shipment.   [name] [name] [place] Certified Pharmacy Technician [place] Management Direct Dial:[phone]

## 2019-04-15 ENCOUNTER — Ambulatory Visit: Payer: Medicare Other | Admitting: *Deleted

## 2019-04-15 ENCOUNTER — Telehealth: Payer: Self-pay | Admitting: *Deleted

## 2019-04-15 ENCOUNTER — Telehealth: Payer: Self-pay

## 2019-04-15 ENCOUNTER — Other Ambulatory Visit: Payer: Self-pay

## 2019-04-15 VITALS — Ht 69.0 in | Wt 242.0 lb

## 2019-04-15 DIAGNOSIS — Z86010 Personal history of colonic polyps: Secondary | ICD-10-CM

## 2019-04-15 NOTE — Progress Notes (Signed)
No egg or soy allergy known to patient  No issues with past sedation with any surgeries  or procedures- 2006 breathing issues at [facility], did not give BP meds - no issues since , no intubation problems  No diet pills per patient No home 02 use per patient  No blood thinners per patient  Pt denies issues with constipation currently- uses Prune juice with good results if has an issue   No A fib or A flutter  EMMI video sent to pt's e mail  03-10-2019 pt had a colo guard from PCP and was negative    Pt verified name, DOB, address and insurance during PV today. Pt mailed instruction packet to included paper to complete and mail back to [facility] with addressed and stamped envelope, Emmi video, copy of consent form to read and not return, and instructions. PV completed over the phone. Pt encouraged to call with questions or issues    Pt is aware that care partner will wait in the car during parking lot; if they feel like they will be too hot to wait in the car; they may wait in the lobby.  We want them to wear a mask (we do not have any that we can provide them), practice social distancing, and we will check their temperatures when they get here.  I did remind patient that their care partner needs to stay in the parking lot the entire time. Pt will wear mask into building

## 2019-04-15 NOTE — Telephone Encounter (Signed)
Dr Carlean Purl,  I did a Pv over the phone with [name] this pm.  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  Her PCP did a colo guard 02-2019 that came back negative.   Should hse now have a colonoscopy?   I explained to her that normally with a hx of polyps, the colo guard is not ordered but the PCP did get in through and was negative.   She is worried about polyps.    Please advise on this, Thanks so very much, [name]

## 2019-04-15 NOTE — Telephone Encounter (Signed)
I called her We will go ahead with colonoscopy hx polyps

## 2019-04-15 NOTE — Progress Notes (Signed)
Chronic Care Management   Visit Note  04/11/2019 Name: [name] MRN: [id number] DOB: 04-26-1941  Referred by: [name], MD Reason for referral : Chronic Care Management   [name] is a 78 y.o. year old female who is a primary care patient of [name], MD. The CCM team was consulted for assistance with chronic disease management and care coordination needs.   Review of patient status, including review of consultants reports, relevant laboratory and other test results, and collaboration with appropriate care team members and the patient's provider was performed as part of comprehensive patient evaluation and provision of chronic care management services.    I spoke with [name] by telephone today.  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  It places patient in the coverage gap and monthly copay is too high.  Patient has been getting samples via PCP office most recently.  Pharmacist Clinical Goal(s):   Over the next 14 days, patient will work with CCM Pharmacist to address needs related to applying for financial assistance (Trulicity)  -->completed on 04/10/2019  Interventions:  Comprehensive medication review performed.  Advised patient to continue taking medications as prescribed, checking BGs & eating a DM healthy diet.  Discussed plans with patient for ongoing care management follow up and provided patient with direct contact information for care management team   Met with patient in provider's office on 03/19/19 to complete paperwork for [employer] Patient assistance program to obtain Trulicity.  Patient verbalizes understanding and would like to proceed with application. Patient needs to mail in her proof of income.  Will send provider  portion.  Counseled on injection technique.  Patient reports compliance and denies adverse events.  04/10/19: Patient approved for Trulicity until 28/00/3491.  Phone number Electronics engineer for [employer]) given to patient to set up delivery to her work office.  Patient grateful for assistance and efforts.  She reports FBG<120.  Encouraged patient to continue her great progress.  CCM RN CM Interventions:  Completed call with patient on 03/31/19:    Assessed for medication adherence   Discussed patient status for completion of mailing in proof of income to [employer] Patient assistance program in order to be considered for financial help with cost of Trulicity - patient states she was waiting on a form to be mailed by embedded PharmD Lottie Dawson and she has not received the form  Advised Lottie Dawson Pharm D will follow up with her to advise  Sent in-basket message to Lottie Dawson PharmD requesting she follow up with patient accordingly  Discussed plans with patient for ongoing care management follow up and provided patient with direct contact information for care management team   Patient Self Care Activities:   Self administers medications as prescribed  Attends all scheduled provider appointments  Calls pharmacy for medication refills  Please see past updates related to this goal by clicking on the "Past Updates" button in the selected goal          Plan:   The care management team will reach out to the patient again over the next 21 days.   Regina Eck, PharmD, [place] Clinical Pharmacist, [facility] [place]  Network  Surveyor, minerals: [phone]

## 2019-04-15 NOTE — Patient Instructions (Signed)
Visit Information  Goals Addressed            This Visit's Progress     Patient Stated   . COMPLETED: I would like financial assistance with my Trulicity (pt-stated)       Current Barriers:  . Financial Barriers-patient cannot afford Trulicity through insurance.  It places patient in the coverage gap and monthly copay is too high.  Patient has been getting samples via PCP office most recently.  Pharmacist Clinical Goal(s):  Marland Kitchen Over the next 14 days, patient will work with CCM Pharmacist to address needs related to applying for financial assistance (Trulicity)  -->completed on 04/10/2019  Interventions: . Comprehensive medication review performed. . Advised patient to continue taking medications as prescribed, checking BGs & eating a DM healthy diet. . Discussed plans with patient for ongoing care management follow up and provided patient with direct contact information for care management team  . Met with patient in provider's office on 03/19/19 to complete paperwork for [employer] Patient assistance program to obtain Trulicity.  Patient verbalizes understanding and would like to proceed with application. Patient needs to mail in her proof of income.  Will send provider portion. . Counseled on injection technique.  Patient reports compliance and denies adverse events. . 04/10/19: Patient approved for Trulicity until 03/49/6116.  Phone number Electronics engineer for [employer]) given to patient to set up delivery to her work office.  Patient grateful for assistance and efforts.  She reports FBG<120.  Encouraged patient to continue her great progress.  CCM RN CM Interventions:  Completed call with patient on 03/31/19:    Assessed for medication adherence   Discussed patient status for completion of mailing in proof of income to [employer] Patient assistance program in order to be considered for financial help with cost of Trulicity - patient states she was waiting on a form to be mailed by  embedded PharmD Lottie Dawson and she has not received the form  Advised Lottie Dawson Pharm D will follow up with her to advise  Sent in-basket message to Lottie Dawson PharmD requesting she follow up with patient accordingly  Discussed plans with patient for ongoing care management follow up and provided patient with direct contact information for care management team   Patient Self Care Activities:  . Self administers medications as prescribed . Attends all scheduled provider appointments . Calls pharmacy for medication refills  Please see past updates related to this goal by clicking on the "Past Updates" button in the selected goal         The patient verbalized understanding of instructions provided today and declined a print copy of patient instruction materials.   The care management team will reach out to the patient again over the next 21 days.   Regina Eck, PharmD, BCPS Clinical Pharmacist, [facility] [place]: [phone]

## 2019-04-16 ENCOUNTER — Telehealth: Payer: Self-pay

## 2019-04-16 ENCOUNTER — Encounter: Payer: Self-pay | Admitting: Internal Medicine

## 2019-04-16 NOTE — Telephone Encounter (Signed)
Will proceed as scheduled. [name]  

## 2019-04-21 ENCOUNTER — Telehealth: Payer: Self-pay

## 2019-04-22 ENCOUNTER — Telehealth: Payer: Self-pay

## 2019-04-25 ENCOUNTER — Telehealth: Payer: Self-pay

## 2019-04-25 ENCOUNTER — Ambulatory Visit: Payer: Self-pay

## 2019-04-25 DIAGNOSIS — E1122 Type 2 diabetes mellitus with diabetic chronic kidney disease: Secondary | ICD-10-CM

## 2019-04-25 DIAGNOSIS — N183 Chronic kidney disease, stage 3 unspecified: Secondary | ICD-10-CM

## 2019-04-25 NOTE — Chronic Care Management (AMB) (Signed)
Chronic Care Management    Clinical Social Work Follow Up Note  04/25/2019 Name: [name] MRN: [id number] DOB: Apr 26, 1941  [name] is a 78 y.o. year old female who is a primary care patient of [name], MD. The CCM team was consulted for assistance with [employer].   Review of patient status, including review of consultants reports, other relevant assessments, and collaboration with appropriate care team members and the patient's provider was performed as part of comprehensive patient evaluation and provision of chronic care management services.    I placed a third unsuccessful call to the patient to assess progression of patient stated goals. I left a HIPAA compliant voice message requesting a return call. CCM SW has completed patient stated SW goals due to an inability to maintain patient contact.   Goals Addressed            This Visit's Progress     Patient Stated   . COMPLETED: "I need some dental work" (pt-stated)       Current Barriers:  . Film/video editor  . Limited education about health plan benefits  Clinical Social Work Clinical Goal(s):  Marland Kitchen Over the next 60 days, client will follow up with an in network dental provider as directed by SW  Interventions: . Patient interviewed and appropriate assessments performed . Discussed plans with patient for ongoing care management follow up and provided patient with direct contact information for care management team . Assisted patient/caregiver with obtaining information about health plan benefits  . Ordered a catalog of in network providers to be shipped to the patients home . Advised the patient to contact an in network dentist upon receipt of catalog . Collaboration with CCM RN Case Electronics engineer Little to update on patients stated goal and patients concern that she is experiencing gastrointestinal symptoms from tooth drainage  CCM RN CM Interventions:  Completed with patient on 03/31/19:     Comprehensive assessment completed to assess for potential infection related to dental abscess and or decay - pt denies s/s suggestive of infection related to unresolved dental issues  Assessed for current treatment status for tooth decay/dental work - pt contacted health plan to learn she has basic dental coverage, she will have to pay out of pocket for maintenance dental work - pt is requesting resources to help cover the cost   Sent in-basket message to embedded BSW [name] requesting she f/u on any available resources that may help cover dental expenses  Discussed plans with patient for ongoing care management follow up and provided patient with direct contact information for care management team  Patient Self Care Activities:  . Attends all scheduled provider appointments . Calls pharmacy for medication refills . Calls provider office for new concerns or questions  Please see past updates related to this goal by clicking on the "Past Updates" button in the selected goal      . COMPLETED: "I want to learn more about my insurance benefits" (pt-stated)       Current Barriers:  . Limited understanding in summary of benefits . Difficulty in understanding customer service representative . Unclear on current dental benefit package  Clinical Social Work Clinical Goal(s):  Marland Kitchen Over the next 30 days, patient will work with SW to address concerns related to the patients knowledge of payor benefits  . Over the next 45 days, patient will be knowledgeable of her current dental package  Interventions: . Telephonic follow up by CCM SW  . Assisted the patient  in contacting her health plan to determine dental and vision benefits . Reinforced health plan benefits with the patient explaining in laymans terms . Requested a booklet indicating in network providers be mailed to the patients home . Educated the patient on her over the counter benefits under her health plan . Advised the patient she  is eligible for a life alert at no cost - see new goal . Encouraged the patient to utilize her over the counter benefit as a way to save money on out of pocket expenses for particular items  Patient Self Care Activities:  . Self administers medications as prescribed . Calls pharmacy for medication refills . Calls provider office for new concerns or questions  Please see past updates related to this goal by clicking on the "Past Updates" button in the selected goal      . COMPLETED: "I want to order a life alert" (pt-stated)       Current Barriers:  . Financial constraints  . Limited knowledge of health plan benefit  Clinical Social Work Clinical Goal(s):  Marland Kitchen Over the next 90 days, client will work with SW to address concerns related to accessing a life alert pendant 03/31/19 re-established target goal date due to treatment delays secondary to COVID-19  Interventions:  Telephonic follow up by CCM SW  Determined the patient has received life alert system in the mail provided by her health plan  Encouraged the patient to contact her health plan to assist with set-up  Determined the patient would like a system that works both inside and outside of the home - unfortunately, the system covered under the patients health plan only works inside the home  Educated the patient on the [place] program  Provided the patient with the contact number to the [place] program and encouraged her to follow up with [facility] regarding enrollment  CCM RN CM Interventions:  Completed call with patient on 03/31/19:    Assessed for home safety concerns  Assessed for receipt and use of emergency alert system - patient needs a system that works both inside and outside of the home - reports she cannot afford to pay a monthly service fee during [place] times  Assessed for patient outreach to [facility] regarding enrollment to the Med Alert program - pt has not contacted and has asked to be given the  contact information again   Advised patient BSW [name] will follow up with her   Sent in-basket message to embedded BSW [name] requesting she follow up with patient   Discussed plans with patient for ongoing care management follow up and provided patient with direct contact information for care management team  Patient Self Care Activities:  . Self administers medications as prescribed . Attends all scheduled provider appointments . Calls provider office for new concerns or questions  Please see past updates related to this goal by clicking on the "Past Updates" button in the selected goal          Follow Up Plan: No further follow up planned by CCM SW. SW will re-initiate patient goals if a return call is received.   [name], BSW, CDP Social Worker, Certified Dementia Practitioner [place] [facility] [phone]  Total time spent performing care coordination and/or care management activities with the patient by phone or face to face = 8 minutes.

## 2019-04-28 ENCOUNTER — Other Ambulatory Visit: Payer: Self-pay

## 2019-04-28 ENCOUNTER — Other Ambulatory Visit: Payer: Self-pay | Admitting: Internal Medicine

## 2019-04-28 ENCOUNTER — Telehealth: Payer: Self-pay | Admitting: Internal Medicine

## 2019-04-28 ENCOUNTER — Telehealth: Payer: Self-pay

## 2019-04-28 DIAGNOSIS — Z1231 Encounter for screening mammogram for malignant neoplasm of breast: Secondary | ICD-10-CM

## 2019-04-28 NOTE — Telephone Encounter (Signed)
Called patient for screening questions and she stated she has a "bad cold". She is not running a temp but has some concerns    Covid-19 Screening Questions  Do you now or have you had a fever in the last 14 days?     No  Do you have any respiratory symptoms of shortness of breath or cough now or in the last 14 days?    Yes  Do you have any family members or close contacts with diagnosed or suspected Covid-19 in the past 14 days?     No  Have you been tested for Covid-19 and found to be positive?    No  Pt made aware of that care partner may wait in the car or come up to the lobby during the procedure but will need to provide their own mask.

## 2019-04-28 NOTE — Telephone Encounter (Signed)
Patient called back and would like to reschedule her appointment because she has a bad cold currently. After talking, we cancelled tomorrows procedure and rescheduled for 06/02/2019. We also printed new instructions and will mail them out to her tomorrow. She understood and was very Patent attorney.

## 2019-04-28 NOTE — Telephone Encounter (Signed)
Called patient back and had to leave a message. Will await her phone call.

## 2019-04-29 ENCOUNTER — Encounter: Payer: Self-pay | Admitting: Internal Medicine

## 2019-04-29 ENCOUNTER — Ambulatory Visit: Payer: Self-pay

## 2019-04-29 ENCOUNTER — Encounter: Payer: Medicare Other | Admitting: Internal Medicine

## 2019-04-29 ENCOUNTER — Ambulatory Visit (INDEPENDENT_AMBULATORY_CARE_PROVIDER_SITE_OTHER): Payer: Medicare Other | Admitting: Internal Medicine

## 2019-04-29 ENCOUNTER — Telehealth: Payer: Self-pay

## 2019-04-29 ENCOUNTER — Other Ambulatory Visit: Payer: Self-pay

## 2019-04-29 VITALS — Ht 69.0 in

## 2019-04-29 DIAGNOSIS — R059 Cough, unspecified: Secondary | ICD-10-CM

## 2019-04-29 DIAGNOSIS — Z20822 Contact with and (suspected) exposure to covid-19: Secondary | ICD-10-CM

## 2019-04-29 DIAGNOSIS — E1122 Type 2 diabetes mellitus with diabetic chronic kidney disease: Secondary | ICD-10-CM

## 2019-04-29 DIAGNOSIS — R05 Cough: Secondary | ICD-10-CM

## 2019-04-29 DIAGNOSIS — I129 Hypertensive chronic kidney disease with stage 1 through stage 4 chronic kidney disease, or unspecified chronic kidney disease: Secondary | ICD-10-CM

## 2019-04-29 DIAGNOSIS — J029 Acute pharyngitis, unspecified: Secondary | ICD-10-CM | POA: Diagnosis not present

## 2019-04-29 DIAGNOSIS — N183 Chronic kidney disease, stage 3 unspecified: Secondary | ICD-10-CM

## 2019-04-29 NOTE — Chronic Care Management (AMB) (Signed)
  Chronic Care Management   Follow Up Note   04/29/2019 Name: [name] MRN: [id number] DOB: 1940/12/13  Referred by: [name], MD Reason for referral : Chronic Care Management (CCM RNCM Telephone Follow Up)   [name] is a 78 y.o. year old female who is a primary care patient of [name], MD. The CCM team was consulted for assistance with chronic disease management and care coordination needs.    Review of patient status, including review of consultants reports, relevant laboratory and other test results, and collaboration with appropriate care team members and the patient's provider was performed as part of comprehensive patient evaluation and provision of chronic care management services.   I spoke with [name] by telephone today to f/u on her complaints of cough and sore throat.   Goals Addressed      Patient Stated   . "I have a cough and sore throat" (pt-stated)       Current Barriers:  Marland Kitchen Knowledge Deficits related to diagnosis and treatment for cough and sore throat  Nurse Case Manager Clinical Goal(s):  Marland Kitchen Over the next 14 days, patient will verbalize understanding of plan for COVID testing and following up.   CCM RN CM Interventions:  04/29/19 call completed with patient  . Evaluation of current treatment plan related to cough and sore throat and patient's adherence to plan as established by provider. . Provided education to patient re: signs and symptoms of COVID-19; discussed potential risk factors and assessed for known exposure . Collaborated with Dr. Baird Cancer regarding patient's reported symptoms of cough and sore throat; discussed that Dr. Baird Cancer recommends completing a virtual visit with her at which time she will recommend COVID-19 testing if deemed appropriate - [name] is aware and is agreeable to Dr. Baird Cancer recommendations . Discussed plans with patient for ongoing care management follow up and provided patient with direct contact  information for care management team  Patient Self Care Activities:  . Self administers medications as prescribed . Attends all scheduled provider appointments . Calls pharmacy for medication refills . Attends church or other social activities . Performs ADL's independently . Performs IADL's independently . Calls provider office for new concerns or questions  Initial goal documentation        Telephone follow up appointment with care management team member scheduled for: 05/01/19   Barb Merino, RN, BSN, CCM Care Management Coordinator [place] [facility]  Direct Phone: [phone]

## 2019-04-29 NOTE — Telephone Encounter (Signed)
Hello,  Dr. Sanders would like the pt to be contacted and scheduled an appt to be tested for the coronavirus.   Thank you. 

## 2019-04-29 NOTE — Patient Instructions (Signed)
Visit Information  Goals Addressed      Patient Stated   . "I have a cough and sore throat" (pt-stated)       Current Barriers:  Marland Kitchen Knowledge Deficits related to diagnosis and treatment for cough and sore throat  Nurse Case Manager Clinical Goal(s):  Marland Kitchen Over the next 14 days, patient will verbalize understanding of plan for COVID testing and following up.   CCM RN CM Interventions:  04/29/19 call completed with patient   . Evaluation of current treatment plan related to cough and sore throat and patient's adherence to plan as established by provider. . Provided education to patient re: signs and symptoms of COVID-19; discussed potential risk factors and assessed for known exposure . Collaborated with Dr. Baird Cancer regarding patient's reported symptoms of cough and sore throat; discussed that Dr. Baird Cancer recommends completing a virtual visit with her at which time she will recommend COVID-19 testing if deemed appropriate - Mrs. Kibby is aware and is agreeable to Dr. Baird Cancer recommendations . Discussed plans with patient for ongoing care management follow up and provided patient with direct contact information for care management team  Patient Self Care Activities:  . Self administers medications as prescribed . Attends all scheduled provider appointments . Calls pharmacy for medication refills . Attends church or other social activities . Performs ADL's independently . Performs IADL's independently . Calls provider office for new concerns or questions  Initial goal documentation       The patient verbalized understanding of instructions provided today and declined a print copy of patient instruction materials.   Telephone follow up appointment with care management team member scheduled for: 05/01/19  Barb Merino, RN, BSN, CCM Care Management Coordinator [place] [facility]  Direct Phone: [phone]

## 2019-04-30 ENCOUNTER — Other Ambulatory Visit: Payer: Medicare Other

## 2019-04-30 DIAGNOSIS — R6889 Other general symptoms and signs: Secondary | ICD-10-CM | POA: Diagnosis not present

## 2019-04-30 DIAGNOSIS — Z20822 Contact with and (suspected) exposure to covid-19: Secondary | ICD-10-CM

## 2019-04-30 NOTE — Telephone Encounter (Signed)
Spoke with patient, scheduled her for COVID 19 testing today at 2 pm at [facility].  Testing protocol reviewed.

## 2019-04-30 NOTE — Addendum Note (Signed)
Addended by: Denyce Robert on: 04/30/2019 08:46 AM   Modules accepted: Orders

## 2019-05-01 ENCOUNTER — Other Ambulatory Visit: Payer: Self-pay

## 2019-05-01 ENCOUNTER — Ambulatory Visit: Payer: Self-pay

## 2019-05-01 ENCOUNTER — Telehealth: Payer: Self-pay

## 2019-05-01 DIAGNOSIS — N183 Chronic kidney disease, stage 3 unspecified: Secondary | ICD-10-CM

## 2019-05-01 DIAGNOSIS — E1122 Type 2 diabetes mellitus with diabetic chronic kidney disease: Secondary | ICD-10-CM

## 2019-05-01 DIAGNOSIS — Z20822 Contact with and (suspected) exposure to covid-19: Secondary | ICD-10-CM

## 2019-05-01 DIAGNOSIS — I129 Hypertensive chronic kidney disease with stage 1 through stage 4 chronic kidney disease, or unspecified chronic kidney disease: Secondary | ICD-10-CM

## 2019-05-01 NOTE — Patient Instructions (Signed)
Visit Information  Goals Addressed      Patient Stated   . "I have a cough and sore throat" (pt-stated)       Current Barriers:  Marland Kitchen Knowledge Deficits related to diagnosis and treatment for cough and sore throat  Nurse Case Manager Clinical Goal(s):  Marland Kitchen Over the next 14 days, patient will verbalize understanding of plan for COVID testing and following up.   CCM RN CM Interventions:  04/30/19 call completed with patient   . Evaluation of current treatment plan related to cough and sore throat and patient's adherence to plan as established by provider. Marland Kitchen 05/01/19: call completed with wife Raychell Holcomb  . Assessed for new or worsening respiratory symptoms - patient states her cough & sore throat have subsided, she denies having any further symptoms suggestive of COVID at this time . Discussed patient completed her COVID testing yesterday on 04/30/19; discussed results are pending . Assessed for questions or concerns - she denies having any further questions at this time . Discussed plans with patient for ongoing care management follow up and provided patient with direct contact information for care management team .  Patient Self Care Activities:  . Self administers medications as prescribed . Attends all scheduled provider appointments . Calls pharmacy for medication refills . Attends church or other social activities . Performs ADL's independently . Performs IADL's independently . Calls provider office for new concerns or questions  Please see past updates related to this goal by clicking on the "Past Updates" button in the selected goal         The patient verbalized understanding of instructions provided today and declined a print copy of patient instruction materials.   The care management team will reach out to the patient again over the next 5-7 days.   Barb Merino, RN, BSN, CCM Care Management Coordinator [place] [facility]  Direct  Phone: [phone]

## 2019-05-01 NOTE — Chronic Care Management (AMB) (Signed)
  Chronic Care Management   Follow Up Note   05/01/2019 Name: [name] MRN: [id number] DOB: 1941-10-02  Referred by: [name], MD Reason for referral : Chronic Care Management (CCM RNCM Telephone Follow up )   [name] is a 78 y.o. year old female who is a primary care patient of [name], MD. The CCM team was consulted for assistance with chronic disease management and care coordination needs.    Review of patient status, including review of consultants reports, relevant laboratory and other test results, and collaboration with appropriate care team members and the patient's provider was performed as part of comprehensive patient evaluation and provision of chronic care management services.    I spoke with [name] today to f/u on her cough and sore throat.   Goals Addressed      Patient Stated   . "I have a cough and sore throat" (pt-stated)       Current Barriers:  Marland Kitchen Knowledge Deficits related to diagnosis and treatment for cough and sore throat  Nurse Case Manager Clinical Goal(s):  Marland Kitchen Over the next 14 days, patient will verbalize understanding of plan for COVID testing and following up.   CCM RN CM Interventions:  04/30/19 call completed with patient   . Evaluation of current treatment plan related to cough and sore throat and patient's adherence to plan as established by provider. Marland Kitchen 05/01/19: call completed with wife [name]  . Assessed for new or worsening respiratory symptoms - patient states her cough & sore throat have subsided, she denies having any further symptoms suggestive of COVID at this time . Discussed patient completed her COVID testing yesterday on 04/30/19; discussed results are pending . Assessed for questions or concerns - she denies having any further questions at this time . Discussed plans with patient for ongoing care management follow up and provided patient with direct contact information for care management team .   Patient Self Care Activities:  . Self administers medications as prescribed . Attends all scheduled provider appointments . Calls pharmacy for medication refills . Attends church or other social activities . Performs ADL's independently . Performs IADL's independently . Calls provider office for new concerns or questions  Please see past updates related to this goal by clicking on the "Past Updates" button in the selected goal         The care management team will reach out to the patient again over the next 5-7 days.    Barb Merino, RN, BSN, CCM Care Management Coordinator [place] [facility]  Direct Phone: [phone]

## 2019-05-05 ENCOUNTER — Telehealth: Payer: Self-pay

## 2019-05-06 LAB — NOVEL CORONAVIRUS, NAA: SARS-CoV-2, NAA: NOT DETECTED

## 2019-05-07 ENCOUNTER — Telehealth: Payer: Self-pay

## 2019-05-09 ENCOUNTER — Telehealth: Payer: Self-pay

## 2019-05-13 ENCOUNTER — Telehealth: Payer: Self-pay

## 2019-05-15 ENCOUNTER — Ambulatory Visit: Payer: Self-pay | Admitting: Pharmacist

## 2019-05-15 DIAGNOSIS — N183 Chronic kidney disease, stage 3 unspecified: Secondary | ICD-10-CM

## 2019-05-15 DIAGNOSIS — E1122 Type 2 diabetes mellitus with diabetic chronic kidney disease: Secondary | ICD-10-CM

## 2019-05-15 DIAGNOSIS — I129 Hypertensive chronic kidney disease with stage 1 through stage 4 chronic kidney disease, or unspecified chronic kidney disease: Secondary | ICD-10-CM

## 2019-05-18 ENCOUNTER — Encounter: Payer: Self-pay | Admitting: Internal Medicine

## 2019-05-18 NOTE — Progress Notes (Signed)
Virtual Visit via Phone   This visit type was conducted due to national recommendations for restrictions regarding the COVID-19 Pandemic (e.g. social distancing) in an effort to limit this patient's exposure and mitigate transmission in our community.  Due to her co-morbid illnesses, this patient is at least at moderate risk for complications without adequate follow up.  This format is felt to be most appropriate for this patient at this time.  All issues noted in this document were discussed and addressed.  A limited physical exam was performed with this format.    This visit type was conducted due to national recommendations for restrictions regarding the COVID-19 Pandemic (e.g. social distancing) in an effort to limit this patient's exposure and mitigate transmission in our community.  Patients identity confirmed using two different identifiers.  This format is felt to be most appropriate for this patient at this time.  All issues noted in this document were discussed and addressed.  No physical exam was performed (except for noted visual exam findings with Video Visits).    Date:  05/18/2019   ID:  [name], DOB February 13, 1941, MRN [id number]  Patient Location:  Home, accompanied by husband  Provider location:   Office    Chief Complaint:  I have a sore throat.   History of Present Illness:    [name] is a 78 y.o. female who presents via video conferencing for a telehealth visit today.    The patient does have symptoms concerning for COVID-19 infection (fever, chills, cough, or new shortness of breath).   She presents today for virtual visit. She prefers this method of contact due to COVID-19 pandemic.  She spoke with our CCM Nurse today, expressing concerns regarding COVID-19. She reports having sore throat, cough for the past week. Her husband has similar symptoms. She has been staying home, but her husband goes to work every day. She adds that she and her husband atend  church weekly. She reports less than 50 people are in attendance. She denies fever/chills, body aches, nausea and diarrhea.     Past Medical History:  Diagnosis Date  . Abdominal pain   . Arthritis    cervical disc degeneration/ oa left knee, carpal tunnel rt wrist, adhesive capsulitis right shoulder, rt hand weakness; lumbar degeneration  . Carpal tunnel syndrome   . Colon adenoma   . 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 Constipation   . Diabetes mellitus without complication ([place])    borderline - diet control  . Diverticulitis hx of  . Diverticulosis   . E. coli infection    2020  . Frequent UTI    hx of urethral injury during colon surgery - states frequent uti's since  . GERD (gastroesophageal reflux disease)   . H/O hiatal hernia   . History of palpitations    in the past  . History of shingles    has a lingering itching on back where shingles were  . Hyperlipidemia   . Hypertension   . Numbness and tingling in right hand    pt. states has numbness of right hand very frequently-watch positioning  . Obesity   . Osteoarthritis   . Osteoporosis   . Pain    pain left knee and pain right hip and right groin  . Personal history of colonic  polyps-adenoma 08/26/2008  . Pneumonia 2005  . Vitamin D deficiency    Past Surgical History:  Procedure Laterality Date  . ABDOMINAL HYSTERECTOMY    . bladder tack    . BREAST BIOPSY Left   . BREAST SURGERY     breast duct resection- benign  . CARPAL TUNNEL RELEASE Right 06/04/2014   Procedure: RIGHT CARPAL TUNNEL RELEASE;  Surgeon: Roseanne Kaufman, MD;  Location: [place];  Service: Orthopedics;  Laterality: Right;  . COLON RESECTION  2008   hx diverticulosis  . COLON SURGERY    . COLONOSCOPY    . colonoscopy [id number]    . JOINT  REPLACEMENT    . OOPHORECTOMY    . POLYPECTOMY    . skin graft left arm - traumatic compression injury left upper arm    . temporary ureter stent    . TOTAL KNEE ARTHROPLASTY  01/05/2012   Procedure: TOTAL KNEE ARTHROPLASTY;  Surgeon: [name], MD;  Location: [facility];  Service: Orthopedics;  Laterality: Right;  . TOTAL KNEE ARTHROPLASTY Left 08/17/2014   Procedure: LEFT TOTAL KNEE ARTHROPLASTY;  Surgeon: [name], MD;  Location: [facility];  Service: Orthopedics;  Laterality: Left;  . ureter repair for tyransected left ureter       Current Meds  Medication Sig  . allopurinol (ZYLOPRIM) 300 MG tablet TAKE 1 TABLET BY MOUTH EVERY DAY  . amLODipine-benazepril (LOTREL) 5-10 MG capsule Take 1 capsule by mouth daily.  Marland Kitchen aspirin EC 81 MG tablet Take 81 mg by mouth daily.  Marland Kitchen atenolol-chlorthalidone (TENORETIC) 50-25 MG tablet TAKE 1 TABLET BY MOUTH EVERY DAY AT 11AM  . Dulaglutide (TRULICITY) 8.18 HU/3.1SH SOPN INJECT 0.5 ML(0.75 MG) SUBCUTANEOUSLY EVERY WEEK IN ABDOMEN, THIGH OR UPPER ARM ROTATING INJECTION SITE  . NON FORMULARY Blood balance formula Ordered from Dr. Irena Cords 1 capsule daily.  [name] DELICA LANCETS 70Y MISC Use to check blood glucose once daily  . ONETOUCH VERIO test strip USE AS DIRECTED TO TEST BLOOD SUGAR BID  . OVER THE COUNTER MEDICATION OTC immune system booster daily  . Polyethylene Glycol 3350 (MIRALAX PO) Take by mouth. Telfair 5 MG TABS X 4 TOTAL  TABS  . rosuvastatin (CRESTOR) 20 MG tablet TAKE 1 TABLET BY MOUTH ONCE DAILY     Allergies:   Atorvastatin calcium, Bee venom, Lipitor [atorvastatin calcium], Oxycodone, and Vicodin [hydrocodone-acetaminophen]   Social History   Tobacco Use  . Smoking status: Never Smoker  . Smokeless tobacco: Never Used  Substance Use Topics  . Alcohol use: No  . Drug use: No     Family Hx: The patient's family history includes Arthritis in her sister and sister; Breast cancer (age of onset:  66) in her mother; COPD in her brother; Cancer in her maternal grandmother; Dementia in her sister; Hypertension in her brother, child, child, child, father, mother, and sister; Lung cancer in her brother; Prostate cancer in her father. There is no history of Colon polyps, Esophageal cancer, Rectal cancer, Stomach cancer, or Colon cancer.  ROS:   Please see the history of present illness.    Review of Systems  Constitutional: Negative.   Cardiovascular: Negative.   Gastrointestinal: Negative.   Neurological: Negative.   Psychiatric/Behavioral: Negative.     All other systems reviewed and are negative.   Labs/Other Tests and Data Reviewed:    Recent Labs: 12/10/2018: ALT 22 02/17/2019: BUN 16; Creatinine, Ser 0.99; Hemoglobin 14.6; Platelets 243; Potassium 3.8;  Sodium 140   Recent Lipid Panel Lab Results  Component Value Date/Time   CHOL 174 12/10/2018 03:30 PM   TRIG 153 (H) 12/10/2018 03:30 PM   HDL 40 12/10/2018 03:30 PM   CHOLHDL 4.4 12/10/2018 03:30 PM   CHOLHDL 7 06/09/2014 09:23 AM   LDLCALC 103 (H) 12/10/2018 03:30 PM   LDLDIRECT 162.2 11/21/2013 11:35 AM    Wt Readings from Last 3 Encounters:  04/15/19 242 lb (109.8 kg)  03/19/19 242 lb 9.6 oz (110 kg)  03/19/19 242 lb 12.8 oz (110.1 kg)     Exam:    Vital Signs:  Ht 5\' 9"  (1.753 m)   BMI 35.74 kg/m    PE not performed, phone visit.   ASSESSMENT & PLAN:     1. Sore throat  She would like to proceed with coronavirus testing. I will refer her to [facility] so an appointment can be scheduled. She is advised to self-quarantine until her test results are available for review.    2. Cough  Please see above. She was advised to consider use of loratadine 10mg  daily. Pt advised sx could be due to PND.     COVID-19 Education: The signs and symptoms of COVID-19 were discussed with the patient and how to seek care for testing (follow up with PCP or arrange E-visit).  The importance of social distancing was discussed  today.  Patient Risk:   After full review of this patients clinical status, I feel that they are at least moderate risk at this time.  Time:   Today, I have spent 18 minutes with the patient with telehealth technology discussing above diagnoses.     Medication Adjustments/Labs and Tests Ordered: Current medicines are reviewed at length with the patient today.  Concerns regarding medicines are outlined above.   Tests Ordered: No orders of the defined types were placed in this encounter.   Medication Changes: No orders of the defined types were placed in this encounter.   Disposition:  Follow up prn.  Signed, Maximino Greenland, MD

## 2019-05-22 ENCOUNTER — Telehealth: Payer: Self-pay

## 2019-05-22 NOTE — Patient Instructions (Signed)
Visit Information  Goals Addressed            This Visit's Progress     Patient Stated   . I would like to keep my blood sugar at goal & maintain my A1c (pt-stated)       Pharmacist Clinical Goal(s):  Marland Kitchen Over the next 90 days, patient with work with PharmD and primary care provider to address disease state management of diabetes  Current Barriers:  . Diabetes: T2DM; most recent A1c 6.4% on 03/19/19  . Current antihyperglycemic regimen: Trulicity 0.75mg  once weekly (patient would not like to increase dose at this time; will continue to explore as warranted) One Touch Verio glucometer/supplies . Denies hypoglycemic symptoms, including dizziness, lightheadedness, shaking, sweating . Denies hyperglycemic symptoms, including polyuria, polydipsia, polyphagia, nocturia, blurred vision, neuropathy . Current exercise: walking . Current blood glucose readings: FBG 100-120, reports FBG<130; notes that BG is higher when patient eats desserts,etc . Cardiovascular risk reduction: o Current hypertensive regimen: amlodipine 5 /benazepril 10, Atenolol 50/chlorthalidone 25 (patient states her BP used to be better controlled when she was on combination therapy.  Upon reviewed of pharmacy claims, patient remains on 2 combination medications for BP listed above.  Will remind patient that she remains on these two combination medications for BP (total of 4 medications targeted for BP).  Last BP in office was 130/74.  Patient encouraged to continue taking BP at home and report back. o Current hyperlipidemia regimen: rosuvastatin 20 mg daily o Patient continues to take daily ASA  Interventions: . Comprehensive medication review performed . Patient inquiring about multivitamin for immune support.  Recommended patient take a complete multivitamin (in addition to all basic vitamins-->one containing zinc, vitD are helpful for immune support/daily health).  Patient is also taking elderberry gummies to aid in immune  support. . Reviewed ADA recommended "diabetes-friendly" diet  (reviewed healthy snack/food options)  Patient Self Care Activities:  . Patient will check blood glucose daily (AM) , document, and provide at future appointments . Patient will focus on medication adherence by 100% medication adherence, improved BP control, blood glucose at goal range . Patient will take medications as prescribed . Patient will contact provider with any episodes of hypoglycemia . Patient will report any questions or concerns to provider    Please see past updates related to this goal by clicking on the "Past Updates" button in the selected goal         The patient verbalized understanding of instructions provided today and declined a print copy of patient instruction materials.   The care management team will reach out to the patient again over the next 4 weeks.  Regina Eck, PharmD, BCPS Clinical Pharmacist, [facility] [place]: [phone]

## 2019-05-22 NOTE — Progress Notes (Signed)
  Chronic Care Management   Visit Note  05/15/2019 Name: [name] MRN: [id number] DOB: 06-05-1941  Referred by: [name], MD Reason for referral : Chronic Care Management   [name] is a 78 y.o. year old female who is a primary care patient of [name], MD. The CCM team was consulted for assistance with chronic disease management and care coordination needs.   Review of patient status, including review of consultants reports, relevant laboratory and other test results, and collaboration with appropriate care team members and the patient's provider was performed as part of comprehensive patient evaluation and provision of chronic care management services.    I spoke with [name] by telephone today.  Objective:   Goals Addressed            This Visit's Progress     Patient Stated   . I would like to keep my blood sugar at goal & maintain my A1c (pt-stated)       Pharmacist Clinical Goal(s):  Marland Kitchen Over the next 90 days, patient with work with PharmD and primary care provider to address disease state management of diabetes  Current Barriers:  . Diabetes: T2DM; most recent A1c 6.4% on 03/19/19  . Current antihyperglycemic regimen: Trulicity 0.75mg  once weekly (patient would not like to increase dose at this time; will continue to explore as warranted) One Touch Verio glucometer/supplies . Denies hypoglycemic symptoms, including dizziness, lightheadedness, shaking, sweating . Denies hyperglycemic symptoms, including polyuria, polydipsia, polyphagia, nocturia, blurred vision, neuropathy . Current exercise: walking . Current blood glucose readings: FBG 100-120, reports FBG<130; notes that BG is higher when patient eats desserts,etc . Cardiovascular risk reduction: o Current hypertensive regimen: amlodipine 5 /benazepril 10, Atenolol 50/chlorthalidone 25 (patient states her BP used to be better controlled when she was on combination therapy.  Upon reviewed of pharmacy  claims, patient remains on 2 combination medications for BP listed above.  Will remind patient that she remains on these two combination medications for BP (total of 4 medications targeted for BP).  Last BP in office was 130/74.  Patient encouraged to continue taking BP at home and report back. o Current hyperlipidemia regimen: rosuvastatin 20 mg daily o Patient continues to take daily ASA  Interventions: . Comprehensive medication review performed . Patient inquiring about multivitamin for immune support.  Recommended patient take a complete multivitamin (in addition to all basic vitamins-->one containing zinc, vitD are helpful for immune support/daily health).  Patient is also taking elderberry gummies to aid in immune support.  Discussed with PCP. Marland Kitchen Reviewed ADA recommended "diabetes-friendly" diet  (reviewed healthy snack/food options)  Patient Self Care Activities:  . Patient will check blood glucose daily (AM) , document, and provide at future appointments . Patient will focus on medication adherence by 100% medication adherence, improved BP control, blood glucose at goal range . Patient will take medications as prescribed . Patient will contact provider with any episodes of hypoglycemia . Patient will report any questions or concerns to provider    Please see past updates related to this goal by clicking on the "Past Updates" button in the selected goal          Plan:   The care management team will reach out to the patient again over the next 2 weeks.  Regina Eck, PharmD, BCPS Clinical Pharmacist, [facility] [place]: [phone]

## 2019-05-23 ENCOUNTER — Telehealth: Payer: Self-pay

## 2019-05-26 ENCOUNTER — Telehealth: Payer: Self-pay

## 2019-05-30 ENCOUNTER — Ambulatory Visit: Payer: Self-pay | Admitting: Pharmacist

## 2019-05-30 ENCOUNTER — Ambulatory Visit: Payer: Self-pay

## 2019-05-30 ENCOUNTER — Telehealth: Payer: Self-pay | Admitting: Internal Medicine

## 2019-05-30 DIAGNOSIS — E1122 Type 2 diabetes mellitus with diabetic chronic kidney disease: Secondary | ICD-10-CM

## 2019-05-30 DIAGNOSIS — N183 Chronic kidney disease, stage 3 unspecified: Secondary | ICD-10-CM

## 2019-05-30 NOTE — Progress Notes (Signed)
  Chronic Care Management   Outreach Note  05/30/2019 Name: [name] MRN: [id number] DOB: 01-Feb-1941  Referred by: [name], MD Reason for referral : Chronic Care Management   An unsuccessful telephone outreach was attempted today. The patient was referred to the case management team by for assistance with chronic care management and care coordination.   Follow Up Plan: A HIPPA compliant phone message was left for the patient providing contact information and requesting a return call.  The care management team will reach out to the patient again over the next 10-14 business days.   Regina Eck, PharmD, BCPS Clinical Pharmacist, [facility] [place]: [phone]

## 2019-05-30 NOTE — Telephone Encounter (Signed)
No charge. 

## 2019-05-30 NOTE — Telephone Encounter (Signed)
Pt rescheduled her colon from 06/02/2019 to 06/09/2019 because she got her appt mixed up with another one that she cannot change.

## 2019-05-30 NOTE — Patient Instructions (Signed)
Social Worker Visit Information  Goals we discussed today:  Goals Addressed            This Visit's Progress     Patient Stated   . "I need a life alert that will work outside of my home" (pt-stated)       Current Barriers:  . Limited ability of current emergency response system . Lacks knowledge of "mobile" systems  Clinical Social Work Clinical Goal(s):  Marland Kitchen Over the next 30 days, patient will work with SW to become more knowledgeable of available mobile alert systems available for purchase   Interventions: . Patient interviewed and appropriate assessments performed . Provided patient with information about [place] program . Educated the patient that unfortunately, the mobile alert systems are not covered by her health plan  . Mailed the patient information on both the [place] program as well as the "Life Alert" company brochure . Scheduled follow up appointment to the patient to confirm receipt of resources  Patient Self Care Activities:  . Self administers medications as prescribed . Attends all scheduled provider appointments . Calls provider office for new concerns or questions  Initial goal documentation         Materials Provided: Yes: mailed the patient information on mobile med alert systems  Follow Up Plan: SW will follow up with patient by phone over the next three weeks.   [name], BSW, CDP Social Worker, Certified Dementia Practitioner [place] [facility] [place] [facility] [phone]

## 2019-05-30 NOTE — Chronic Care Management (AMB) (Signed)
  Chronic Care Management    Social Work Follow Up Note  05/30/2019 Name: [name] MRN: [id number] DOB: [birth year]  [name] is a 78 y.o. year old female who is a primary care patient of [name], MD. The CCM team was consulted for assistance with [employer] .   Review of patient status, including review of consultants reports, other relevant assessments, and collaboration with appropriate care team members and the patient's provider was performed as part of comprehensive patient evaluation and provision of chronic care management services.      Goals Addressed            This Visit's Progress     Patient Stated   . "I need a life alert that will work outside of my home" (pt-stated)       Current Barriers:  . Limited ability of current emergency response system . Lacks knowledge of "mobile" systems  Clinical Social Work Clinical Goal(s):  Marland Kitchen Over the next 30 days, patient will work with SW to become more knowledgeable of available mobile alert systems available for purchase   Interventions: . Patient interviewed and appropriate assessments performed . Provided patient with information about [place] program . Educated the patient that unfortunately, the mobile alert systems are not covered by her health plan  . Mailed the patient information on both the [place] program as well as the "Life Alert" company brochure . Scheduled follow up appointment to the patient to confirm receipt of resources  Patient Self Care Activities:  . Self administers medications as prescribed . Attends all scheduled provider appointments . Calls provider office for new concerns or questions  Initial goal documentation         Follow Up Plan: SW will follow up with patient by phone over the next 3 weeks.   [name], BSW, CDP Social Worker, Certified Dementia Practitioner [place] [facility] [place] [facility] [phone]  Total time spent  performing care coordination and/or care management activities with the patient by phone or face to face = 12 minutes.

## 2019-06-02 ENCOUNTER — Encounter: Payer: Medicare Other | Admitting: Internal Medicine

## 2019-06-06 ENCOUNTER — Telehealth: Payer: Self-pay | Admitting: Internal Medicine

## 2019-06-06 NOTE — Telephone Encounter (Signed)
Left message on vmail to call back regarding Covid-19 screening questions. Covid-19 Screening Questions:   Do you now or have you had a fever in the last 14 days?    Do you have any respiratory symptoms of shortness of breath or cough now or in the last 14 days?    Do you have any family members or close contacts with diagnosed  or suspected Covid-19 in the past 14 days?    Have you been tested for Covid-19 and found to be positive?

## 2019-06-09 ENCOUNTER — Telehealth: Payer: Self-pay

## 2019-06-09 ENCOUNTER — Encounter: Payer: Self-pay | Admitting: Internal Medicine

## 2019-06-09 ENCOUNTER — Other Ambulatory Visit: Payer: Self-pay

## 2019-06-09 ENCOUNTER — Ambulatory Visit (AMBULATORY_SURGERY_CENTER): Payer: Medicare Other | Admitting: Internal Medicine

## 2019-06-09 VITALS — BP 105/57 | HR 62 | Temp 97.3°F | Resp 16 | Ht 69.0 in | Wt 242.0 lb

## 2019-06-09 DIAGNOSIS — Z86010 Personal history of colonic polyps: Secondary | ICD-10-CM | POA: Diagnosis not present

## 2019-06-09 DIAGNOSIS — E1122 Type 2 diabetes mellitus with diabetic chronic kidney disease: Secondary | ICD-10-CM | POA: Diagnosis not present

## 2019-06-09 MED ORDER — SODIUM CHLORIDE 0.9 % IV SOLN: 500.0000 mL | Freq: Once | INTRAVENOUS | Status: DC

## 2019-06-09 NOTE — Patient Instructions (Addendum)
No polyps or cancer seen.  I do not recommend any more routine colonoscopy for you!  I appreciate the opportunity to care for you. Gatha Mayer, MD, FACG  YOU HAD AN ENDOSCOPIC PROCEDURE TODAY AT [facility]:   Refer to the procedure report that was given to you for any specific questions about what was found during the examination.  If the procedure report does not answer your questions, please call your gastroenterologist to clarify.  If you requested that your care partner not be given the details of your procedure findings, then the procedure report has been included in a sealed envelope for you to review at your convenience later.  YOU SHOULD EXPECT: Some feelings of bloating in the abdomen. Passage of more gas than usual.  Walking can help get rid of the air that was put into your GI tract during the procedure and reduce the bloating. If you had a lower endoscopy (such as a colonoscopy or flexible sigmoidoscopy) you may notice spotting of blood in your stool or on the toilet paper. If you underwent a bowel prep for your procedure, you may not have a normal bowel movement for a few days.  Please Note:  You might notice some irritation and congestion in your nose or some drainage.  This is from the oxygen used during your procedure.  There is no need for concern and it should clear up in a day or so.  SYMPTOMS TO REPORT IMMEDIATELY:   Following lower endoscopy (colonoscopy or flexible sigmoidoscopy):  Excessive amounts of blood in the stool  Significant tenderness or worsening of abdominal pains  Swelling of the abdomen that is new, acute  Fever of 100F or higher  For urgent or emergent issues, a gastroenterologist can be reached at any hour by calling [phone].   DIET:  We do recommend a small meal at first, but then you may proceed to your regular diet.  Drink plenty of fluids but you should avoid alcoholic beverages for 24 hours.  ACTIVITY:  You  should plan to take it easy for the rest of today and you should NOT DRIVE or use heavy machinery until tomorrow (because of the sedation medicines used during the test).    FOLLOW UP: Our staff will call the number listed on your records 48-72 hours following your procedure to check on you and address any questions or concerns that you may have regarding the information given to you following your procedure. If we do not reach you, we will leave a message.  We will attempt to reach you two times.  During this call, we will ask if you have developed any symptoms of COVID 19. If you develop any symptoms (ie: fever, flu-like symptoms, shortness of breath, cough etc.) before then, please call [phone].  If you test positive for Covid 19 in the 2 weeks post procedure, please call and report this information to [place].    If any biopsies were taken you will be contacted by phone or by letter within the next 1-3 weeks.  Please call us at [phone] if you have not heard about the biopsies in 3 weeks.    SIGNATURES/CONFIDENTIALITY: You and/or your care partner have signed paperwork which will be entered into your electronic medical record.  These signatures attest to the fact that that the information above on your After Visit Summary has been reviewed and is understood.  Full responsibility of the confidentiality of this discharge information lies with  you and/or your care-partner.

## 2019-06-09 NOTE — Op Note (Signed)
[place] Patient Name: Kyrstyn Greear Procedure Date: 06/09/2019 10:38 AM MRN: [id number] Endoscopist: Gatha Mayer , MD Age: 78 Referring MD:  Date of Birth: 1941/04/11 Gender: Female Account #: [id number] Procedure:                Colonoscopy Indications:              Surveillance: Personal history of adenomatous                            polyps on last colonoscopy 5 years ago Medicines:                Propofol per Anesthesia, Monitored Anesthesia Care Procedure:                Pre-Anesthesia Assessment:                           - Prior to the procedure, a History and Physical                            was performed, and patient medications and                            allergies were reviewed. The patient's tolerance of                            previous anesthesia was also reviewed. The risks                            and benefits of the procedure and the sedation                            options and risks were discussed with the patient.                            All questions were answered, and informed consent                            was obtained. Prior Anticoagulants: The patient has                            taken no previous anticoagulant or antiplatelet                            agents. ASA Grade Assessment: III - A patient with                            severe systemic disease. After reviewing the risks                            and benefits, the patient was deemed in                            satisfactory condition to undergo the procedure.  After obtaining informed consent, the colonoscope                            was passed under direct vision. Throughout the                            procedure, the patient's blood pressure, pulse, and                            oxygen saturations were monitored continuously. The                            Colonoscope was introduced through the anus and    advanced to the the cecum, identified by                            appendiceal orifice and ileocecal valve. The                            colonoscopy was performed without difficulty. The                            patient tolerated the procedure well. The quality                            of the bowel preparation was good. The ileocecal                            valve, appendiceal orifice, and rectum were                            photographed. The bowel preparation used was                            Miralax via split dose instruction. Scope In: 10:48:56 AM Scope Out: 10:58:35 AM Scope Withdrawal Time: 0 hours 7 minutes 2 seconds  Total Procedure Duration: 0 hours 9 minutes 39 seconds  Findings:                 The perianal and digital rectal examinations were                            normal.                           Diverticula were found in the left colon.                           The exam was otherwise without abnormality on                            direct and retroflexion views. Complications:            No immediate complications. Estimated Blood Loss:     Estimated blood loss: none. Impression:               -  Diverticulosis in the left colon.                           - The examination was otherwise normal on direct                            and retroflexion views.                           - No specimens collected. Recommendation:           - Patient has a contact number available for                            emergencies. The signs and symptoms of potential                            delayed complications were discussed with the                            patient. Return to normal activities tomorrow.                            Written discharge instructions were provided to the                            patient.                           - Resume previous diet.                           - Continue present medications.                           - No repeat  colonoscopy due to age. Gatha Mayer, MD 06/09/2019 11:04:59 AM This report has been signed electronically.

## 2019-06-09 NOTE — Progress Notes (Signed)
Report to PACU, RN, vss, BBS= Clear.  

## 2019-06-11 ENCOUNTER — Telehealth: Payer: Self-pay | Admitting: *Deleted

## 2019-06-11 ENCOUNTER — Telehealth: Payer: Self-pay

## 2019-06-11 ENCOUNTER — Ambulatory Visit: Payer: Medicare Other

## 2019-06-11 ENCOUNTER — Ambulatory Visit: Payer: Self-pay | Admitting: Pharmacist

## 2019-06-11 NOTE — Telephone Encounter (Signed)
  Follow up Call-  Call back number 06/09/2019  Post procedure Call Back phone  # [phone]  Permission to leave phone message Yes  Some recent data might be hidden     Patient questions:  Do you have a fever, pain , or abdominal swelling? No. Pain Score  0 *  Have you tolerated food without any problems? Yes.    Have you been able to return to your normal activities? Yes.    Do you have any questions about your discharge instructions: Diet   No. Medications  No. Follow up visit  No.  Do you have questions or concerns about your Care? No.  Actions: * If pain score is 4 or above: No action needed, pain <4.  1. Have you developed a fever since your procedure? no  2.   Have you had an respiratory symptoms (SOB or cough) since your procedure? no  3.   Have you tested positive for COVID 19 since your procedure no  4.   Have you had any family members/close contacts diagnosed with the COVID 19 since your procedure?  no   If yes to any of these questions please route to Joylene John, RN and Alphonsa Gin, Therapist, sports.

## 2019-06-11 NOTE — Telephone Encounter (Signed)
No answer, left message to call back later today, B.Javarius Tsosie RN. 

## 2019-06-13 NOTE — Progress Notes (Signed)
  Chronic Care Management   Outreach Note  06/11/2019 Name: [name] MRN: [id number] DOB: [birth year]  Referred by: [name], MD Reason for referral : Chronic Care Management   A second unsuccessful telephone outreach was attempted today. The patient was referred to the case management team for assistance with chronic care management and care coordination.   Follow Up Plan: A HIPPA compliant phone message was left for the patient providing contact information and requesting a return call.  The care management team will reach out to the patient again over the next 2 weeks.  Regina Eck, PharmD, BCPS Clinical Pharmacist, [facility] [place]: [phone]

## 2019-06-17 ENCOUNTER — Ambulatory Visit: Payer: Self-pay

## 2019-06-17 DIAGNOSIS — E1122 Type 2 diabetes mellitus with diabetic chronic kidney disease: Secondary | ICD-10-CM

## 2019-06-17 DIAGNOSIS — N183 Chronic kidney disease, stage 3 unspecified: Secondary | ICD-10-CM

## 2019-06-17 NOTE — Patient Instructions (Signed)
Social Worker Visit Information  Goals we discussed today:  Goals Addressed            This Visit's Progress     Patient Stated   . "I need a life alert that will work outside of my home" (pt-stated)       Current Barriers:  . Limited ability of current emergency response system . Lacks knowledge of "mobile" systems  Clinical Social Work Clinical Goal(s):  Marland Kitchen Over the next 30 days, patient will work with SW to become more knowledgeable of available mobile alert systems available for purchase   CCM SW Interventions: Completed 06/17/2019 . Outbound call to the patient to confirm receipt of mailed resources . Assessed patient interest in proceeding with a resource to obtain a Med Alert Sytem . The patient confirmed she is interested most in accessing the [place] system considering this resource will visit the patient home and assist with installation . Advised the patient that due to COVID restrictions it is possible this resource is not operating in their usual manner . Obtained consent to contact [place] on behalf of the patient to determine type of services being offered at this time as well as to obtain pricing information . Placed an outbound call to [place], left a voice message requesting a return call . Scheduled follow up call to the patient over the next week  Patient Self Care Activities:  . Self administers medications as prescribed . Attends all scheduled provider appointments . Calls provider office for new concerns or questions  Please see past updates related to this goal by clicking on the "Past Updates" button in the selected goal          Follow Up Plan: SW will follow up with patient by phone over the next week once obtaining desired information from [employer], CDP Social Worker, Certified Dementia Practitioner [place] [facility] [place] [facility] [phone]

## 2019-06-17 NOTE — Chronic Care Management (AMB) (Signed)
  Chronic Care Management   Social Work Follow Up Note  06/17/2019 Name: [name] MRN: [id number] DOB: 09/23/1941  [name] is a 78 y.o. year old female who is a primary care patient of [name], MD. The CCM team was consulted for assistance with [employer] .   Review of patient status, including review of consultants reports, other relevant assessments, and collaboration with appropriate care team members and the patient's provider was performed as part of comprehensive patient evaluation and provision of chronic care management services.     Goals Addressed            This Visit's Progress     Patient Stated   . "I need a life alert that will work outside of my home" (pt-stated)       Current Barriers:  . Limited ability of current emergency response system . Lacks knowledge of "mobile" systems  Clinical Social Work Clinical Goal(s):  Marland Kitchen Over the next 30 days, patient will work with SW to become more knowledgeable of available mobile alert systems available for purchase   CCM SW Interventions: Completed 06/17/2019 . Outbound call to the patient to confirm receipt of mailed resources . Assessed patient interest in proceeding with a resource to obtain a Med Alert Sytem . The patient confirmed she is interested most in accessing the [place] system considering this resource will visit the patient home and assist with installation . Advised the patient that due to COVID restrictions it is possible this resource is not operating in their usual manner . Obtained consent to contact [place] on behalf of the patient to determine type of services being offered at this time as well as to obtain pricing information . Placed an outbound call to [place], left a voice message requesting a return call . Scheduled follow up call to the patient over the next week  Patient Self Care Activities:  . Self administers medications as prescribed  . Attends all scheduled provider appointments . Calls provider office for new concerns or questions  Please see past updates related to this goal by clicking on the "Past Updates" button in the selected goal          Follow Up Plan: SW will follow up with patient by phone over the next week once obtaining desired information from [place], CDP Social Worker, Certified Dementia Practitioner [place] [facility] [place] [facility] [phone]  Total time spent performing care coordination and/or care management activities with the patient by phone or face to face = 11 minutes.

## 2019-06-18 ENCOUNTER — Other Ambulatory Visit: Payer: Self-pay

## 2019-06-18 NOTE — Patient Outreach (Signed)
[facility]) Care Management  06/18/2019  [name] June 21, 1941 [id number]  Medication Adherence call to [name] Compliant Voice message left with a call back number. [name] is showing past due on Amlodipine/Benazepril 5/10 mg under [place].  [place] Management Direct Dial [phone]  Fax [phone] [name]

## 2019-06-19 ENCOUNTER — Ambulatory Visit (INDEPENDENT_AMBULATORY_CARE_PROVIDER_SITE_OTHER): Payer: Medicare Other | Admitting: Internal Medicine

## 2019-06-19 ENCOUNTER — Encounter: Payer: Self-pay | Admitting: Internal Medicine

## 2019-06-19 ENCOUNTER — Other Ambulatory Visit: Payer: Self-pay

## 2019-06-19 VITALS — BP 140/76 | HR 66 | Temp 98.3°F | Ht 69.0 in | Wt 246.8 lb

## 2019-06-19 DIAGNOSIS — I129 Hypertensive chronic kidney disease with stage 1 through stage 4 chronic kidney disease, or unspecified chronic kidney disease: Secondary | ICD-10-CM | POA: Diagnosis not present

## 2019-06-19 DIAGNOSIS — E7800 Pure hypercholesterolemia, unspecified: Secondary | ICD-10-CM | POA: Diagnosis not present

## 2019-06-19 DIAGNOSIS — N183 Chronic kidney disease, stage 3 unspecified: Secondary | ICD-10-CM

## 2019-06-19 DIAGNOSIS — E1122 Type 2 diabetes mellitus with diabetic chronic kidney disease: Secondary | ICD-10-CM

## 2019-06-19 DIAGNOSIS — Z6836 Body mass index (BMI) 36.0-36.9, adult: Secondary | ICD-10-CM

## 2019-06-19 NOTE — Patient Instructions (Signed)

## 2019-06-20 LAB — LIPID PANEL
Chol/HDL Ratio: 4.4 ratio (ref 0.0–4.4)
Cholesterol, Total: 175 mg/dL (ref 100–199)
HDL: 40 mg/dL (ref >39)
LDL Calculated: 99 mg/dL (ref 0–99)
Triglycerides: 181 mg/dL — ABNORMAL HIGH (ref 0–149)
VLDL Cholesterol Cal: 36 mg/dL (ref 5–40)

## 2019-06-20 LAB — HEMOGLOBIN A1C
Est. average glucose Bld gHb Est-mCnc: 134 mg/dL
Hgb A1c MFr Bld: 6.3 % — ABNORMAL HIGH (ref 4.8–5.6)

## 2019-06-20 LAB — CMP14+EGFR
ALT: 26 IU/L (ref 0–32)
AST: 31 IU/L (ref 0–40)
Albumin/Globulin Ratio: 1.7 (ref 1.2–2.2)
Albumin: 4 g/dL (ref 3.7–4.7)
Alkaline Phosphatase: 49 IU/L (ref 39–117)
BUN/Creatinine Ratio: 23 (ref 12–28)
BUN: 26 mg/dL (ref 8–27)
Bilirubin Total: 0.5 mg/dL (ref 0.0–1.2)
CO2: 23 mmol/L (ref 20–29)
Calcium: 9.1 mg/dL (ref 8.7–10.3)
Chloride: 102 mmol/L (ref 96–106)
Creatinine, Ser: 1.12 mg/dL — ABNORMAL HIGH (ref 0.57–1.00)
GFR calc Af Amer: 55 mL/min/{1.73_m2} — ABNORMAL LOW (ref >59)
GFR calc non Af Amer: 47 mL/min/{1.73_m2} — ABNORMAL LOW (ref >59)
Globulin, Total: 2.4 g/dL (ref 1.5–4.5)
Glucose: 119 mg/dL — ABNORMAL HIGH (ref 65–99)
Potassium: 3.6 mmol/L (ref 3.5–5.2)
Sodium: 141 mmol/L (ref 134–144)
Total Protein: 6.4 g/dL (ref 6.0–8.5)

## 2019-06-21 NOTE — Progress Notes (Signed)
Subjective:     Patient ID: [name] , female    DOB: [birth year] , 78 y.o.   MRN: [id number]   Chief Complaint  Patient presents with  . Diabetes  . Hyperlipidemia    HPI  Diabetes She presents for her follow-up diabetic visit. She has type 2 diabetes mellitus. There are no hypoglycemic associated symptoms. Pertinent negatives for diabetes include no blurred vision and no chest pain. There are no hypoglycemic complications. Risk factors for coronary artery disease include diabetes mellitus, dyslipidemia, hypertension, obesity, sedentary lifestyle and post-menopausal. She is compliant with treatment most of the time. She participates in exercise intermittently. An ACE inhibitor/angiotensin II receptor blocker is being taken. Eye exam is current.  Hyperlipidemia This is a chronic problem. The current episode started more than 1 year ago. The problem is uncontrolled. Exacerbating diseases include chronic renal disease and diabetes. Pertinent negatives include no chest pain. Current antihyperlipidemic treatment includes statins. The current treatment provides moderate improvement of lipids. Compliance problems include adherence to exercise.      Past Medical History:  Diagnosis Date  . Abdominal pain   . Arthritis    cervical disc degeneration/ oa left knee, carpal tunnel rt wrist, adhesive capsulitis right shoulder, rt hand weakness; lumbar degeneration  . Carpal tunnel syndrome   . 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 Constipation   . Diabetes mellitus without complication ([place])    borderline - diet control  . Diverticulitis hx of  . Diverticulosis   . E. coli infection    2020  . Frequent UTI    hx of urethral injury during colon surgery - states frequent uti's since  . GERD (gastroesophageal reflux  disease)   . H/O hiatal hernia   . History of palpitations    in the past  . History of shingles    has a lingering itching on back where shingles were  . Hyperlipidemia   . Hypertension   . Numbness and tingling in right hand    pt. states has numbness of right hand very frequently-watch positioning  . Obesity   . Osteoarthritis   . Osteoporosis   . Pain    pain left knee and pain right hip and right groin  . Personal history of colonic polyps-adenoma 08/26/2008  . Pneumonia 2005  . Vitamin D deficiency      Family History  Problem Relation Age of Onset  . Breast cancer Mother 53  . Hypertension Mother   . Prostate cancer Father   . Hypertension Father   . Dementia Sister   . Lung cancer Brother   . Hypertension Brother   . COPD Brother   . Cancer Maternal Grandmother   . Arthritis Sister   . Hypertension Sister   . Arthritis Sister   . Hypertension Child   . Hypertension Child   . Hypertension Child   . Colon polyps Neg Hx   . Esophageal cancer Neg Hx   . Rectal cancer Neg Hx   . Stomach cancer Neg Hx   . Colon cancer Neg Hx      Current Outpatient Medications:  .  allopurinol (ZYLOPRIM) 300 MG tablet, TAKE 1 TABLET BY MOUTH EVERY DAY, Disp: 90 tablet, Rfl: 0 .  amLODipine-benazepril (LOTREL) 5-10 MG capsule, Take 1 capsule by mouth daily., Disp: 90  capsule, Rfl: 1 .  aspirin EC 81 MG tablet, Take 81 mg by mouth daily., Disp: , Rfl:  .  atenolol-chlorthalidone (TENORETIC) 50-25 MG tablet, TAKE 1 TABLET BY MOUTH EVERY DAY AT 11AM, Disp: 90 tablet, Rfl: 0 .  Dulaglutide (TRULICITY) 2.70 JJ/0.0XF SOPN, INJECT 0.5 ML(0.75 MG) SUBCUTANEOUSLY EVERY WEEK IN ABDOMEN, THIGH OR UPPER ARM ROTATING INJECTION SITE, Disp: 2 mL, Rfl: 0 .  NON FORMULARY, Blood balance formula Ordered from Dr. Irena Cords 1 capsule daily., Disp: , Rfl:  .  ONETOUCH DELICA LANCETS 81W MISC, Use to check blood glucose once daily, Disp: 100 each, Rfl: 3 .  ONETOUCH VERIO test strip, USE AS DIRECTED TO TEST  BLOOD SUGAR BID, Disp: , Rfl:  .  OVER THE COUNTER MEDICATION, OTC immune system booster daily, Disp: , Rfl:  .  rosuvastatin (CRESTOR) 20 MG tablet, TAKE 1 TABLET BY MOUTH ONCE DAILY, Disp: 90 tablet, Rfl: 0 .  B Complex Vitamins (VITAMIN B COMPLEX PO), Take by mouth daily., Disp: , Rfl:  .  Probiotic Product (PROBIOTIC DAILY PO), Take 3 capsules by mouth daily., Disp: , Rfl:    Allergies  Allergen Reactions  . Atorvastatin Calcium Itching    Other reaction(s): Itching  . Bee Venom Hives  . Lipitor [Atorvastatin Calcium] Itching  . Oxycodone Other (See Comments)    hallucinations  . Vicodin [Hydrocodone-Acetaminophen] Other (See Comments)    hallucinations     Review of Systems  Constitutional: Negative.   Eyes: Negative for blurred vision.  Respiratory: Negative.   Cardiovascular: Negative.  Negative for chest pain.  Gastrointestinal: Negative.   Neurological: Negative.   Psychiatric/Behavioral: Negative.      Today's Vitals   06/19/19 1202  BP: 140/76  Pulse: 66  Temp: 98.3 F (36.8 C)  TempSrc: Oral  Weight: 246 lb 12.8 oz (111.9 kg)  Height: _0  (1.753 m)  PainSc: 0-No pain   Body mass index is 36.45 kg/m.   Objective:  Physical Exam Vitals signs and nursing note reviewed.  Constitutional:      Appearance: Normal appearance. She is obese.  HENT:     Head: Normocephalic and atraumatic.  Cardiovascular:     Rate and Rhythm: Normal rate and regular rhythm.     Heart sounds: Normal heart sounds.  Pulmonary:     Effort: Pulmonary effort is normal.     Breath sounds: Normal breath sounds.  Skin:    General: Skin is warm.  Neurological:     General: No focal deficit present.     Mental Status: She is alert.  Psychiatric:        Mood and Affect: Mood normal.        Behavior: Behavior normal.         Assessment And Plan:     1. Type 2 diabetes mellitus with stage 3 chronic kidney disease, without long-term current use of insulin ([place])  I will check  labs as listed below. Importance of regular exercise was discussed with the patient. Importance of dietary compliance is also stressed to the patient.    - Lipid panel - CMP14+EGFR - Hemoglobin A1c  2. Pure hypercholesterolemia  Chronic. I will check a lipid panel today. She is not fasting. She is encouraged to limit her intake of fried foods and to incorporate more exercise into her daily routine.    3. Hypertensive nephropathy  Chronic, fairly controlled. She will continue with current meds. She is encouraged to limit her salt intake.  4. Class 2 severe obesity due to excess calories with serious comorbidity and body mass index (BMI) of 36.0 to 36.9 in adult [facility])  Importance of achieving optimal weight to decrease risk of cardiovascular disease and cancers was discussed with the patient in full detail. Importance of regular exercise was stressed to the patient.  She is encouraged to start slowly - start with 10 minutes twice daily at least three to four days per week and to gradually build to 30 minutes five days weekly. She was given tips to incorporate more activity into her daily routine - take stairs when possible, park farther away from her job, grocery stores, etc.     Maximino Greenland, MD    THE PATIENT IS ENCOURAGED TO PRACTICE SOCIAL DISTANCING DUE TO THE COVID-19 PANDEMIC.

## 2019-06-24 ENCOUNTER — Ambulatory Visit: Payer: Self-pay | Admitting: Pharmacist

## 2019-06-24 ENCOUNTER — Other Ambulatory Visit: Payer: Self-pay

## 2019-06-24 ENCOUNTER — Ambulatory Visit (INDEPENDENT_AMBULATORY_CARE_PROVIDER_SITE_OTHER): Payer: Medicare Other

## 2019-06-24 ENCOUNTER — Telehealth: Payer: Self-pay

## 2019-06-24 ENCOUNTER — Other Ambulatory Visit: Payer: Self-pay | Admitting: Internal Medicine

## 2019-06-24 DIAGNOSIS — N183 Chronic kidney disease, stage 3 unspecified: Secondary | ICD-10-CM

## 2019-06-24 DIAGNOSIS — E1122 Type 2 diabetes mellitus with diabetic chronic kidney disease: Secondary | ICD-10-CM

## 2019-06-24 DIAGNOSIS — I10 Essential (primary) hypertension: Secondary | ICD-10-CM

## 2019-06-24 DIAGNOSIS — E7800 Pure hypercholesterolemia, unspecified: Secondary | ICD-10-CM

## 2019-06-24 MED ORDER — ROSUVASTATIN CALCIUM 20 MG PO TABS: 20.0000 mg | ORAL_TABLET | Freq: Every day | ORAL | 0 refills | Status: DC

## 2019-06-24 MED ORDER — ATENOLOL-CHLORTHALIDONE 50-25 MG PO TABS: ORAL_TABLET | ORAL | 0 refills | Status: DC

## 2019-06-24 MED ORDER — ALLOPURINOL 300 MG PO TABS: 300.0000 mg | ORAL_TABLET | Freq: Every day | ORAL | 0 refills | Status: DC

## 2019-06-24 MED ORDER — ASPIRIN EC 81 MG PO TBEC: 81.0000 mg | DELAYED_RELEASE_TABLET | Freq: Every day | ORAL | 0 refills | Status: DC

## 2019-06-24 MED ORDER — AMLODIPINE BESY-BENAZEPRIL HCL 5-10 MG PO CAPS: ORAL_CAPSULE | ORAL | 0 refills | Status: DC

## 2019-06-24 NOTE — Chronic Care Management (AMB) (Signed)
Chronic Care Management    Social Work Follow Up Note  06/24/2019 Name: [name] MRN: [id number] DOB: December 25, 1940  [name] is a 78 y.o. year old female who is a primary care patient of [name], MD. The CCM team was consulted for assistance with [employer] .   Review of patient status, including review of consultants reports, other relevant assessments, and collaboration with appropriate care team members and the patient's provider was performed as part of comprehensive patient evaluation and provision of chronic care management services.     Outpatient Encounter Medications as of 06/24/2019  Medication Sig Note  . allopurinol (ZYLOPRIM) 300 MG tablet TAKE 1 TABLET BY MOUTH EVERY DAY   . amLODipine-benazepril (LOTREL) 5-10 MG capsule TAKE 1 CAPSULE BY MOUTH EVERY DAY   . aspirin EC 81 MG tablet Take 81 mg by mouth daily.   Marland Kitchen atenolol-chlorthalidone (TENORETIC) 50-25 MG tablet TAKE 1 TABLET BY MOUTH EVERY DAY AT 11AM   . B Complex Vitamins (VITAMIN B COMPLEX PO) Take by mouth daily.   . Dulaglutide (TRULICITY) A999333 0000000 SOPN INJECT 0.5 ML(0.75 MG) SUBCUTANEOUSLY EVERY WEEK IN ABDOMEN, THIGH OR UPPER ARM ROTATING INJECTION SITE 03/14/2019: Injects of Fridays  . NON FORMULARY Blood balance formula Ordered from Dr. Irena Cords 1 capsule daily.   [name] DELICA LANCETS 99991111 MISC Use to check blood glucose once daily   . ONETOUCH VERIO test strip USE AS DIRECTED TO TEST BLOOD SUGAR BID   . OVER THE COUNTER MEDICATION OTC immune system booster daily   . Probiotic Product (PROBIOTIC DAILY PO) Take 3 capsules by mouth daily.   . rosuvastatin (CRESTOR) 20 MG tablet TAKE 1 TABLET BY MOUTH ONCE DAILY    No facility-administered encounter medications on file as of 06/24/2019.      Goals Addressed            This Visit's Progress     Patient Stated   . "I need a life alert that will work outside of my home" (pt-stated)   On track    Current Barriers:  . Limited ability of  current emergency response system . Lacks knowledge of "mobile" systems  Clinical Social Work Clinical Goal(s):  Marland Kitchen Over the next 30 days, patient will work with SW to become more knowledgeable of available mobile alert systems available for purchase   CCM SW Interventions: Completed 06/24/2019 . Collaboration with Joycelyn Schmid with [place] program . Informed by Joycelyn Schmid the med alert program is currently operating but running about three weeks behind . Outbound call to the patient to inform the med alert program is currently operating  . Advised the patient to contact Joycelyn Schmid regarding pricing questions and to arrange installation if desired . Provided the contact number for the program  . Scheduled a follow up call to the patient over the next 3 weeks to assess outcome of patient call  Patient Self Care Activities:  . Self administers medications as prescribed . Attends all scheduled provider appointments . Calls provider office for new concerns or questions  Please see past updates related to this goal by clicking on the "Past Updates" button in the selected goal          Follow Up Plan: SW will follow up with patient by phone over the next 3-4 weeks.   [name], BSW, CDP Social Worker, Certified Dementia Practitioner [place] [facility] [place] Management [phone]  Total time spent performing care coordination and/or care management activities with the patient by  phone or face to face = 11 minutes.

## 2019-06-24 NOTE — Patient Instructions (Signed)
Social Worker Visit Information  Goals we discussed today:  Goals Addressed            This Visit's Progress     Patient Stated   . "I need a life alert that will work outside of my home" (pt-stated)   On track    Current Barriers:  . Limited ability of current emergency response system . Lacks knowledge of "mobile" systems  Clinical Social Work Clinical Goal(s):  Marland Kitchen Over the next 30 days, patient will work with SW to become more knowledgeable of available mobile alert systems available for purchase   CCM SW Interventions: Completed 06/24/2019 . Collaboration with Joycelyn Schmid with [place] program . Informed by Joycelyn Schmid the med alert program is currently operating but running about three weeks behind . Outbound call to the patient to inform the med alert program is currently operating  . Advised the patient to contact Joycelyn Schmid regarding pricing questions and to arrange installation if desired . Provided the contact number for the program  . Scheduled a follow up call to the patient over the next 3 weeks to assess outcome of patient call  Patient Self Care Activities:  . Self administers medications as prescribed . Attends all scheduled provider appointments . Calls provider office for new concerns or questions  Please see past updates related to this goal by clicking on the "Past Updates" button in the selected goal          Materials Provided: Yes: Provided the patient with contact number and information to [place] Program  Follow Up Plan: SW will follow up with patient by phone over the next 3-4 weeks  [name], BSW, CDP Social Worker, Certified Dementia Practitioner [place] [facility] [place] [facility] [phone]

## 2019-06-24 NOTE — Telephone Encounter (Signed)
-----   Message from [name], MD sent at 06/21/2019  9:39 AM EDT ----- Here are your lab results:  Your triglycerides are elevated.  Be sure to increase activity as tolerated. Also decrease intake of processed foods.   Your kidney function has decreased. You are in the range of stage 3 kidney disease. Be sure to stay well hydrated.  Your hba1c is stable. Please continue with current meds.   Please let me know if you have any questions.   Sincerely,    Robyn N. Baird [facility], MD

## 2019-06-24 NOTE — Telephone Encounter (Signed)
Left the patient a message to call back for lab results or for her to view them on her MyChart

## 2019-06-30 NOTE — Progress Notes (Signed)
Chronic Care Management   Visit Note  06/24/2019 Name: [name] MRN: [id number] DOB: December 04, 1940  Referred by: [name], MD Reason for referral : Chronic Care Management   [name] is a 78 y.o. year old female who is a primary care patient of [name], MD. The CCM team was consulted for assistance with chronic disease management and care coordination needs.   Review of patient status, including review of consultants reports, relevant laboratory and other test results, and collaboration with appropriate care team members and the patient's provider was performed as part of comprehensive patient evaluation and provision of chronic care management services.    I spoke with [name] by telephone today.  Medications: Outpatient Encounter Medications as of 06/24/2019  Medication Sig Note  . B Complex Vitamins (VITAMIN B COMPLEX PO) Take by mouth daily.   . Dulaglutide (TRULICITY) A999333 0000000 SOPN INJECT 0.5 ML(0.75 MG) SUBCUTANEOUSLY EVERY WEEK IN ABDOMEN, THIGH OR UPPER ARM ROTATING INJECTION SITE 03/14/2019: Injects of Fridays  . NON FORMULARY Blood balance formula Ordered from Dr. Irena Cords 1 capsule daily.   [name] DELICA LANCETS 99991111 MISC Use to check blood glucose once daily   . ONETOUCH VERIO test strip USE AS DIRECTED TO TEST BLOOD SUGAR BID   . OVER THE COUNTER MEDICATION OTC immune system booster daily   . Probiotic Product (PROBIOTIC DAILY PO) Take 3 capsules by mouth daily.   . [DISCONTINUED] allopurinol (ZYLOPRIM) 300 MG tablet TAKE 1 TABLET BY MOUTH EVERY DAY   . [DISCONTINUED] amLODipine-benazepril (LOTREL) 5-10 MG capsule TAKE 1 CAPSULE BY MOUTH EVERY DAY   . [DISCONTINUED] aspirin EC 81 MG tablet Take 81 mg by mouth daily.   . [DISCONTINUED] atenolol-chlorthalidone (TENORETIC) 50-25 MG tablet TAKE 1 TABLET BY MOUTH EVERY DAY AT 11AM   . [DISCONTINUED] rosuvastatin (CRESTOR) 20 MG tablet TAKE 1 TABLET BY MOUTH ONCE DAILY    No facility-administered encounter  medications on file as of 06/24/2019.      Objective:   Goals Addressed            This Visit's Progress     Patient Stated   . I would like to keep my blood sugar at goal & maintain my A1c (pt-stated)       Pharmacist Clinical Goal(s):  Marland Kitchen Over the next 90 days, patient with work with PharmD and primary care provider to address disease state management of diabetes  Current Barriers:  . Diabetes: T2DM; A1c 6.4% on 03/19/19-->6.3% on 06/19/19 . Current antihyperglycemic regimen: Trulicity 0.75mg  once weekly (patient stable on this dose) One Touch Verio glucometer/supplies . Denies hypoglycemic symptoms, including dizziness, lightheadedness, shaking, sweating . Denies hyperglycemic symptoms, including polyuria, polydipsia, polyphagia, nocturia, blurred vision, neuropathy . Current exercise: walking . Current blood glucose readings: FBG 100-120, reports FBG<130; notes that BG is higher when patient eats desserts,etc . Cardiovascular risk reduction: o Current hypertensive regimen: amlodipine 5 /benazepril 10, Atenolol 50/chlorthalidone 25 (patient states her BP used to be better controlled when she was on combination therapy.  Upon reviewed of pharmacy claims, patient remains on 2 combination medications for BP listed above.  Will remind patient that she remains on these two combination medications for BP (total of 4 medications targeted for BP).  Last BP in office was 130/74.  Patient encouraged to continue taking BP at home and report back. o Current hyperlipidemia regimen: rosuvastatin 20 mg daily (getting 90 DS) o Patient continues to take daily ASA  Interventions: . Comprehensive medication review performed .  Patient inquiring about multivitamin for immune support.  Recommended patient take a complete multivitamin (in addition to all basic vitamins-->one containing zinc, vitD are helpful for immune support/daily health).  Patient is also taking elderberry gummies to aid in immune support.  . Reviewed ADA recommended "diabetes-friendly" diet  (reviewed healthy snack/food options)  Patient Self Care Activities:  . Patient will check blood glucose daily (AM) , document, and provide at future appointments . Patient will focus on medication adherence by 100% medication adherence, improved BP control, blood glucose at goal range . Patient will take medications as prescribed . Patient will contact provider with any episodes of hypoglycemia . Patient will report any questions or concerns to provider    Please see past updates related to this goal by clicking on the "Past Updates" button in the selected goal      . My blood pressure is not as controlled as it used to be. (pt-stated)       Current Barriers:  . Uncontrolled hypertension, complicated by 123456, HLD . Current antihypertensive regimen: atenolol/chlorthalidone 50-25mg   . Previous antihypertensives tried: was also on amlodipine/benazepril 5-10mg  daily  (Patient did not pick up refills,  She stated she would restart this medication.  This will likely bring her blood pressure back to goal <130/80) . Current home BP readings: 150/90s, 145/85  Pharmacist Clinical Goal(s):  Marland Kitchen Over the next 90 days, patient will work with PharmD and providers to optimize antihypertensive regimen  Interventions: . Comprehensive medication review performed; medication list updated in the electronic medical record.  . Counseled patient on dietary changes: decreased salt intake, decreased fried foods. . Counseled patient to continue taking medication as prescribed.  Patient Self Care Activities:  . Patient will continue to check BP daily , document, and provide at future appointments . Patient will focus on medication adherence by picking up refills of all anti-hypertensive medications.  Initial goal documentation          Plan:   The care management team will reach out to the patient again over the next 3 weeks days.   Regina Eck, PharmD, BCPS Clinical Pharmacist, [facility] [place]: [phone]

## 2019-06-30 NOTE — Patient Instructions (Signed)
Visit Information  Goals Addressed            This Visit's Progress     Patient Stated   . I would like to keep my blood sugar at goal & maintain my A1c (pt-stated)       Pharmacist Clinical Goal(s):  Marland Kitchen Over the next 90 days, patient with work with PharmD and primary care provider to address disease state management of diabetes  Current Barriers:  . Diabetes: T2DM; A1c 6.4% on 03/19/19-->6.3% on 06/19/19 . Current antihyperglycemic regimen: Trulicity 0.75mg  once weekly (patient stable on this dose) One Touch Verio glucometer/supplies . Denies hypoglycemic symptoms, including dizziness, lightheadedness, shaking, sweating . Denies hyperglycemic symptoms, including polyuria, polydipsia, polyphagia, nocturia, blurred vision, neuropathy . Current exercise: walking . Current blood glucose readings: FBG 100-120, reports FBG<130; notes that BG is higher when patient eats desserts,etc . Cardiovascular risk reduction: o Current hypertensive regimen: amlodipine 5 /benazepril 10, Atenolol 50/chlorthalidone 25 (patient states her BP used to be better controlled when she was on combination therapy.  Upon reviewed of pharmacy claims, patient remains on 2 combination medications for BP listed above.  Will remind patient that she remains on these two combination medications for BP (total of 4 medications targeted for BP).  Last BP in office was 130/74.  Patient encouraged to continue taking BP at home and report back. o Current hyperlipidemia regimen: rosuvastatin 20 mg daily (getting 90 DS) o Patient continues to take daily ASA  Interventions: . Comprehensive medication review performed . Patient inquiring about multivitamin for immune support.  Recommended patient take a complete multivitamin (in addition to all basic vitamins-->one containing zinc, vitD are helpful for immune support/daily health).  Patient is also taking elderberry gummies to aid in immune support. . Reviewed ADA recommended  "diabetes-friendly" diet  (reviewed healthy snack/food options)  Patient Self Care Activities:  . Patient will check blood glucose daily (AM) , document, and provide at future appointments . Patient will focus on medication adherence by 100% medication adherence, improved BP control, blood glucose at goal range . Patient will take medications as prescribed . Patient will contact provider with any episodes of hypoglycemia . Patient will report any questions or concerns to provider    Please see past updates related to this goal by clicking on the "Past Updates" button in the selected goal      . My blood pressure is not as controlled as it used to be. (pt-stated)       Current Barriers:  . Uncontrolled hypertension, complicated by 123456, HLD . Current antihypertensive regimen: atenolol/chlorthalidone 50-25mg   . Previous antihypertensives tried: was also on amlodipine/benazepril 5-10mg  daily  (Patient did not pick up refills,  She stated she would restart this medication.  This will likely bring her blood pressure back to goal <130/80) . Current home BP readings: 150/90s, 145/85  Pharmacist Clinical Goal(s):  Marland Kitchen Over the next 90 days, patient will work with PharmD and providers to optimize antihypertensive regimen  Interventions: . Comprehensive medication review performed; medication list updated in the electronic medical record.  . Counseled patient on dietary changes: decreased salt intake, decreased fried foods. . Counseled patient to continue taking medication as prescribed.  Patient Self Care Activities:  . Patient will continue to check BP daily , document, and provide at future appointments . Patient will focus on medication adherence by picking up refills of all anti-hypertensive medications.  Initial goal documentation        The patient verbalized understanding of instructions  provided today and declined a print copy of patient instruction materials.   The care  management team will reach out to the patient again over the next 3 weeks.  Regina Eck, PharmD, BCPS Clinical Pharmacist, [facility] [place]: [phone]

## 2019-07-09 ENCOUNTER — Telehealth: Payer: Self-pay

## 2019-07-14 ENCOUNTER — Ambulatory Visit: Payer: Self-pay

## 2019-07-14 DIAGNOSIS — N183 Chronic kidney disease, stage 3 unspecified: Secondary | ICD-10-CM

## 2019-07-14 DIAGNOSIS — I10 Essential (primary) hypertension: Secondary | ICD-10-CM

## 2019-07-14 DIAGNOSIS — E1122 Type 2 diabetes mellitus with diabetic chronic kidney disease: Secondary | ICD-10-CM

## 2019-07-14 NOTE — Patient Instructions (Signed)
Social Worker Visit Information  Goals we discussed today:  Goals Addressed            This Visit's Progress     Patient Stated   . "I need a life alert that will work outside of my home" (pt-stated)   Not on track    Current Barriers:  . Limited ability of current emergency response system . Lacks knowledge of "mobile" systems  Clinical Social Work Clinical Goal(s):  Marland Kitchen Over the next 30 days, patient will work with SW to become more knowledgeable of available mobile alert systems available for purchase Not Met . 07/14/2019- Over the next 30 days the patient will confirm receipt of mailed resource and work with SW to develop understanding of how to order a Med Alert   CCM SW Interventions: Completed 07/14/2019 . Outbound call placed to the patient to assess progression of patient stated goal . Determined the patient has been quite busy assisting with moving her son out of SNF back into his home after an extensive LTC stay following two strokes within the last year . Reviewed the process for ordering a Med Alert through the [facility] program . Mailed the patient a flyer for the Med Alert program to include pricing information as well as the contact number to order a device . Scheduled a follow up call to the patient over the next 3-4 weeks to confirm receipt of mailing  Patient Self Care Activities:  . Self administers medications as prescribed . Attends all scheduled provider appointments . Calls provider office for new concerns or questions  Please see past updates related to this goal by clicking on the "Past Updates" button in the selected goal      . "I need to know if Medicare covers dental work" (pt-stated)       Current Barriers:  . Limited understanding of dental plan benefits . Lacks knowledge of [employer] resource  Clinical Social Work Clinical Goal(s):  Marland Kitchen Over the next 45 days, client will follow up with [employer] [facility] as directed by SW  Interventions: . Patient interviewed and appropriate assessments performed . Determined the patient is in need of dental work and unclear what her health plan covers . Reminded the patient of SW assistance several weeks ago to call her health plan and gather benefit information . Provided patient with information about [facility] Counseling should she be interested in making changes during open enrollment . Mailed the patient information on how to contact a Public librarian through the [employer] of [place] . Scheduled follow up call to the patient over the next month to confirm receipt of resource  Patient Self Care Activities:  . Self administers medications as prescribed . Attends all scheduled provider appointments . Calls provider office for new concerns or questions  Initial goal documentation         Materials Provided: Yes: Mailed information on Med Alert program and [facility] Counselors  Follow Up Plan: SW will follow up with patient by phone over the next 3-4 weeks.   [name], BSW, CDP Social Worker, Certified Dementia Practitioner [place] [facility] [place] [facility] [phone]

## 2019-07-14 NOTE — Chronic Care Management (AMB) (Signed)
Chronic Care Management     Social Work Follow Up Note  07/14/2019 Name: [name] MRN: [id number] DOB: June 08, 1941  [name] is a 78 y.o. year old female who is a primary care patient of [name], MD. The CCM team was consulted for assistance with [employer] .   Review of patient status, including review of consultants reports, other relevant assessments, and collaboration with appropriate care team members and the patient's provider was performed as part of comprehensive patient evaluation and provision of chronic care management services.     Outpatient Encounter Medications as of 07/14/2019  Medication Sig Note  . allopurinol (ZYLOPRIM) 300 MG tablet Take 1 tablet (300 mg total) by mouth daily.   Marland Kitchen amLODipine-benazepril (LOTREL) 5-10 MG capsule TAKE 1 CAPSULE BY MOUTH EVERY DAY   . aspirin EC 81 MG tablet Take 1 tablet (81 mg total) by mouth daily.   Marland Kitchen atenolol-chlorthalidone (TENORETIC) 50-25 MG tablet TAKE 1 TABLET BY MOUTH EVERY DAY AT 11AM   . B Complex Vitamins (VITAMIN B COMPLEX PO) Take by mouth daily.   . Dulaglutide (TRULICITY) 5.46 EV/0.3JK SOPN INJECT 0.5 ML(0.75 MG) SUBCUTANEOUSLY EVERY WEEK IN ABDOMEN, THIGH OR UPPER ARM ROTATING INJECTION SITE 03/14/2019: Injects of Fridays  . NON FORMULARY Blood balance formula Ordered from Dr. Irena Cords 1 capsule daily.   [name] DELICA LANCETS 09F MISC Use to check blood glucose once daily   . ONETOUCH VERIO test strip USE AS DIRECTED TO TEST BLOOD SUGAR BID   . OVER THE COUNTER MEDICATION OTC immune system booster daily   . Probiotic Product (PROBIOTIC DAILY PO) Take 3 capsules by mouth daily.   . rosuvastatin (CRESTOR) 20 MG tablet Take 1 tablet (20 mg total) by mouth daily.    No facility-administered encounter medications on file as of 07/14/2019.      Goals Addressed            This Visit's Progress     Patient Stated   . "I need a life alert that will work outside of my home" (pt-stated)   Not on track     Current Barriers:  . Limited ability of current emergency response system . Lacks knowledge of "mobile" systems  Clinical Social Work Clinical Goal(s):  Marland Kitchen Over the next 30 days, patient will work with SW to become more knowledgeable of available mobile alert systems available for purchase Not Met . 07/14/2019- Over the next 30 days the patient will confirm receipt of mailed resource and work with SW to develop understanding of how to order a Med Alert   CCM SW Interventions: Completed 07/14/2019 . Outbound call placed to the patient to assess progression of patient stated goal . Determined the patient has been quite busy assisting with moving her son out of SNF back into his home after an extensive LTC stay following two strokes within the last year . Reviewed the process for ordering a Med Alert through the [facility] program . Mailed the patient a flyer for the Med Alert program to include pricing information as well as the contact number to order a device . Scheduled a follow up call to the patient over the next 3-4 weeks to confirm receipt of mailing  Patient Self Care Activities:  . Self administers medications as prescribed . Attends all scheduled provider appointments . Calls provider office for new concerns or questions  Please see past updates related to this goal by clicking on the "Past Updates" button in the selected goal      . "  I need to know if Medicare covers dental work" (pt-stated)       Current Barriers:  . Limited understanding of dental plan benefits . Lacks knowledge of [employer] resource  Clinical Social Work Clinical Goal(s):  Marland Kitchen Over the next 45 days, client will follow up with [employer] ([place]) as directed by SW  Interventions: . Patient interviewed and appropriate assessments performed . Determined the patient is in need of dental work and unclear what her health plan covers . Reminded the patient of  SW assistance several weeks ago to call her health plan and gather benefit information . Provided patient with information about [facility] Counseling should she be interested in making changes during open enrollment . Mailed the patient information on how to contact a Public librarian through the [employer] of [place] . Scheduled follow up call to the patient over the next month to confirm receipt of resource  Patient Self Care Activities:  . Self administers medications as prescribed . Attends all scheduled provider appointments . Calls provider office for new concerns or questions  Initial goal documentation         Follow Up Plan: SW will follow up with patient by phone over the next 3-4 weeks.   [name], BSW, CDP Social Worker, Certified Dementia Practitioner [place] [facility] [place] [facility] [facility] [phone]  Total time spent performing care coordination and/or care management activities with the patient by phone or face to face = 13 minutes.

## 2019-07-21 ENCOUNTER — Telehealth: Payer: Self-pay

## 2019-07-22 ENCOUNTER — Other Ambulatory Visit: Payer: Self-pay

## 2019-07-22 DIAGNOSIS — E1122 Type 2 diabetes mellitus with diabetic chronic kidney disease: Secondary | ICD-10-CM

## 2019-07-22 DIAGNOSIS — N183 Chronic kidney disease, stage 3 unspecified: Secondary | ICD-10-CM

## 2019-07-22 MED ORDER — TRULICITY 0.75 MG/0.5ML ~~LOC~~ SOAJ: SUBCUTANEOUS | 0 refills | Status: DC

## 2019-07-29 ENCOUNTER — Telehealth: Payer: Self-pay

## 2019-07-30 ENCOUNTER — Ambulatory Visit (INDEPENDENT_AMBULATORY_CARE_PROVIDER_SITE_OTHER): Payer: Medicare Other | Admitting: Pharmacist

## 2019-07-30 DIAGNOSIS — N183 Chronic kidney disease, stage 3 unspecified: Secondary | ICD-10-CM

## 2019-07-30 DIAGNOSIS — E1122 Type 2 diabetes mellitus with diabetic chronic kidney disease: Secondary | ICD-10-CM

## 2019-07-30 DIAGNOSIS — I10 Essential (primary) hypertension: Secondary | ICD-10-CM

## 2019-07-30 DIAGNOSIS — E7800 Pure hypercholesterolemia, unspecified: Secondary | ICD-10-CM | POA: Diagnosis not present

## 2019-07-30 NOTE — Progress Notes (Signed)
  Chronic Care Management   Outreach Note  07/30/2019 Name: Shakaya Nyborg MRN: [id number] DOB: [birth year]  Referred by: [name], MD Reason for referral : Chronic Care Management   An unsuccessful telephone outreach was attempted today. The patient was referred to the case management team by for assistance with care management and care coordination.   Follow Up Plan: The care management team will reach out to the patient again over the next 2 weeks.  Regina Eck, PharmD, BCPS Clinical Pharmacist, [facility] [place]: [phone]

## 2019-08-04 ENCOUNTER — Telehealth: Payer: Self-pay

## 2019-08-04 ENCOUNTER — Ambulatory Visit: Payer: Self-pay

## 2019-08-04 DIAGNOSIS — E1122 Type 2 diabetes mellitus with diabetic chronic kidney disease: Secondary | ICD-10-CM

## 2019-08-04 DIAGNOSIS — N183 Chronic kidney disease, stage 3 unspecified: Secondary | ICD-10-CM

## 2019-08-04 NOTE — Chronic Care Management (AMB) (Signed)
  Chronic Care Management   Social Work Note  08/04/2019 Name: [name] MRN: [id number] DOB: [birth year]  SW placed a follow up call to the patient to assess progression of patient stated goals. Unfortunately, the patient was not available at the time of SW call. Message left requesting return call.   Follow Up Plan: SW will follow up with patient by phone over the next 10 days if no return call is received.  [name], BSW, CDP Social Worker, Certified Dementia Practitioner [place] [facility] [phone]

## 2019-08-07 ENCOUNTER — Ambulatory Visit: Payer: Self-pay

## 2019-08-07 ENCOUNTER — Telehealth: Payer: Self-pay

## 2019-08-07 DIAGNOSIS — I10 Essential (primary) hypertension: Secondary | ICD-10-CM

## 2019-08-07 DIAGNOSIS — E1122 Type 2 diabetes mellitus with diabetic chronic kidney disease: Secondary | ICD-10-CM

## 2019-08-07 DIAGNOSIS — N183 Chronic kidney disease, stage 3 unspecified: Secondary | ICD-10-CM

## 2019-08-07 NOTE — Chronic Care Management (AMB) (Signed)
  Chronic Care Management   Outreach Note  08/07/2019 Name: [name] MRN: [id number] DOB: [birth year]  Referred by: [name], MD Reason for referral : Chronic Care Management (CCM RNCM Telephone Follow up)   An unsuccessful telephone outreach was attempted today. The patient was referred to the case management team by [name] MD for assistance with care management and care coordination.    Follow Up Plan: Telephone follow up appointment with care management team member scheduled for: 08/25/19  Barb Merino, RN, BSN, CCM Care Management Coordinator [place] [facility]  Direct Phone: [phone]

## 2019-08-08 ENCOUNTER — Ambulatory Visit: Payer: Self-pay | Admitting: Pharmacist

## 2019-08-08 NOTE — Progress Notes (Signed)
  Chronic Care Management   Outreach Note  08/08/2019 Name: [name] MRN: [id number] DOB: [birth year]  Referred by: [name], MD Reason for referral : Chronic Care Management   A second unsuccessful telephone outreach was attempted today. The patient was referred to the case management team for assistance with care management and care coordination.   Follow Up Plan: The care management team will reach out to the patient again over the next 2-3 weeks.  Regina Eck, PharmD, BCPS Clinical Pharmacist, [facility] [place]: [phone]

## 2019-08-12 ENCOUNTER — Telehealth: Payer: Self-pay

## 2019-08-12 ENCOUNTER — Ambulatory Visit: Payer: Self-pay

## 2019-08-12 DIAGNOSIS — N183 Chronic kidney disease, stage 3 unspecified: Secondary | ICD-10-CM

## 2019-08-12 DIAGNOSIS — E1122 Type 2 diabetes mellitus with diabetic chronic kidney disease: Secondary | ICD-10-CM

## 2019-08-12 NOTE — Chronic Care Management (AMB) (Signed)
  Chronic Care Management   Social Work Note  08/12/2019 Name: [name] MRN: [id number] DOB: [birth year]  [name] is a 78 y.o. year old female who sees [name], MD for primary care. The CCM team was consulted for assistance with [employer] .   SW placed an outbound call to the patient to assess progression of patient stated goal. Unfortunately, the patient was with a customer and unavailable to talk at the time of SW call.    Follow Up Plan: SW will follow up with patient by phone over the next week.  [name], BSW, CDP Social Worker, Certified Dementia Practitioner [place] [facility] [place] [facility] [phone]

## 2019-08-18 ENCOUNTER — Telehealth: Payer: Self-pay

## 2019-08-18 ENCOUNTER — Ambulatory Visit: Payer: Self-pay

## 2019-08-18 DIAGNOSIS — N183 Chronic kidney disease, stage 3 unspecified: Secondary | ICD-10-CM

## 2019-08-18 DIAGNOSIS — E1122 Type 2 diabetes mellitus with diabetic chronic kidney disease: Secondary | ICD-10-CM

## 2019-08-18 DIAGNOSIS — I10 Essential (primary) hypertension: Secondary | ICD-10-CM

## 2019-08-18 NOTE — Chronic Care Management (AMB) (Signed)
Chronic Care Management   Social Work Follow Up Note  08/18/2019 Name: [name] MRN: [id number] DOB: 04/09/1941  SW placed a third unsuccessful outbound call to the patient to assist with goal progression of patient stated SW goals (see care plan below). SW left a HIPAA compliant voice message requesting a return call.   Goals Addressed            This Visit's Progress     Patient Stated   . COMPLETED: "I need a life alert that will work outside of my home" (pt-stated) Goal not met- unable to maintain contact with patient       Current Barriers:  . Limited ability of current emergency response system . Lacks knowledge of "mobile" systems  Clinical Social Work Clinical Goal(s):  Marland Kitchen Over the next 30 days, patient will work with SW to become more knowledgeable of available mobile alert systems available for purchase Not Met . 07/14/2019- Over the next 30 days the patient will confirm receipt of mailed resource and work with SW to develop understanding of how to order a Med Alert   CCM SW Interventions: Completed 07/14/2019 . Outbound call placed to the patient to assess progression of patient stated goal . Determined the patient has been quite busy assisting with moving her son out of SNF back into his home after an extensive LTC stay following two strokes within the last year . Reviewed the process for ordering a Med Alert through the [facility] program . Mailed the patient a flyer for the Med Alert program to include pricing information as well as the contact number to order a device . Scheduled a follow up call to the patient over the next 3-4 weeks to confirm receipt of mailing  Patient Self Care Activities:  . Self administers medications as prescribed . Attends all scheduled provider appointments . Calls provider office for new concerns or questions  Please see past updates related to this goal by clicking on the "Past Updates" button in the selected goal      . COMPLETED: "I  need to know if Medicare covers dental work" (pt-stated)Goal not met- unable to maintain contact with the patient       Current Barriers:  . Limited understanding of dental plan benefits . Lacks knowledge of [employer] resource  Clinical Social Work Clinical Goal(s):  Marland Kitchen Over the next 45 days, client will follow up with [employer] ([place]) as directed by SW  Interventions: . Patient interviewed and appropriate assessments performed . Determined the patient is in need of dental work and unclear what her health plan covers . Reminded the patient of SW assistance several weeks ago to call her health plan and gather benefit information . Provided patient with information about [facility] should she be interested in making changes during open enrollment . Mailed the patient information on how to contact a Public librarian through the [employer] [place] . Scheduled follow up call to the patient over the next month to confirm receipt of resource  Patient Self Care Activities:  . Self administers medications as prescribed . Attends all scheduled provider appointments . Calls provider office for new concerns or questions  Initial goal documentation         Follow Up Plan: No further SW follow up planned at this time. SW has communicated to RN Case Manager who will remain active with the patient.   [name], BSW, CDP Social Worker, Certified  Dementia Practitioner [place] [facility] Care Management [phone]

## 2019-08-20 ENCOUNTER — Other Ambulatory Visit: Payer: Self-pay

## 2019-08-20 DIAGNOSIS — Z20822 Contact with and (suspected) exposure to covid-19: Secondary | ICD-10-CM

## 2019-08-21 ENCOUNTER — Telehealth: Payer: Self-pay

## 2019-08-23 LAB — NOVEL CORONAVIRUS, NAA: SARS-CoV-2, NAA: NOT DETECTED

## 2019-08-25 ENCOUNTER — Ambulatory Visit (INDEPENDENT_AMBULATORY_CARE_PROVIDER_SITE_OTHER): Payer: Medicare Other

## 2019-08-25 ENCOUNTER — Telehealth: Payer: Self-pay

## 2019-08-25 DIAGNOSIS — E1122 Type 2 diabetes mellitus with diabetic chronic kidney disease: Secondary | ICD-10-CM

## 2019-08-25 DIAGNOSIS — I10 Essential (primary) hypertension: Secondary | ICD-10-CM

## 2019-08-25 DIAGNOSIS — N183 Chronic kidney disease, stage 3 unspecified: Secondary | ICD-10-CM

## 2019-08-25 NOTE — Chronic Care Management (AMB) (Signed)
Chronic Care Management   Follow Up Note   08/25/2019 Name: [name] MRN: [id number] DOB: Mar 08, 1941  Referred by: [name], MD Reason for referral : Chronic Care Management (CCM RNCM Case Collaboration )   [name] is a 78 y.o. year old female who is a primary care patient of [name], MD. The CCM team was consulted for assistance with chronic disease management and care coordination needs.    Review of patient status, including review of consultants reports, relevant laboratory and other test results, and collaboration with appropriate care team members and the patient's provider was performed as part of comprehensive patient evaluation and provision of chronic care management services.    SDOH (Social Determinants of Health) screening performed today: None. See Care Plan for related entries.   Advanced Directives Status: N See Care Plan and Vynca application for related entries.  Case Collaboration with embedded Pharm D Lottie Dawson.    Outpatient Encounter Medications as of 08/25/2019  Medication Sig  . allopurinol (ZYLOPRIM) 300 MG tablet Take 1 tablet (300 mg total) by mouth daily.  Marland Kitchen amLODipine-benazepril (LOTREL) 5-10 MG capsule TAKE 1 CAPSULE BY MOUTH EVERY DAY  . aspirin EC 81 MG tablet Take 1 tablet (81 mg total) by mouth daily.  Marland Kitchen atenolol-chlorthalidone (TENORETIC) 50-25 MG tablet TAKE 1 TABLET BY MOUTH EVERY DAY AT 11AM  . B Complex Vitamins (VITAMIN B COMPLEX PO) Take by mouth daily.  . Dulaglutide (TRULICITY) 5.69 VX/4.8AX SOPN INJECT 0.5 ML(0.75 MG) SUBCUTANEOUSLY EVERY WEEK IN ABDOMEN, THIGH OR UPPER ARM ROTATING INJECTION SITE  . NON FORMULARY Blood balance formula Ordered from Dr. Irena Cords 1 capsule daily.  [name] DELICA LANCETS 65V MISC Use to check blood glucose once daily  . ONETOUCH VERIO test strip USE AS DIRECTED TO TEST BLOOD SUGAR BID  . OVER THE COUNTER MEDICATION OTC immune system booster daily  . Probiotic Product (PROBIOTIC DAILY  PO) Take 3 capsules by mouth daily.  . rosuvastatin (CRESTOR) 20 MG tablet Take 1 tablet (20 mg total) by mouth daily.   No facility-administered encounter medications on file as of 08/25/2019.      Goals Addressed      Patient Stated   . "I want to get my sugar under control" (pt-stated)       Current Barriers:  Marland Kitchen Knowledge Deficits related to DM disease management. [name] states she doesn't like having to check her blood sugar or take medication for diabetes so she wants to work hard to get her blood sugar under control. We discussed the value of good glucose management whether she needs to stay on medication long term. She is very eager to learn about self health management strategies related to DM.   Nurse Case Manager Clinical Goal(s):  Marland Kitchen Over the next 90 days, patient will verbalize understanding of plan for self health management strategies related to diabetes.  03/31/19 re-establish goal date due to treatment delays from COVID-19 . Over the next 14 days, patient will activate nutrition strategies to promote improved cbg control: manage portion sizes utilizing plate method, journal cbg response to meals, increase vegetable intake  03/31/19 Goal Met . Over the next 14 days, patient will maintain or improve daily exercise/walking regimen  03/31/19 Goal Met  CCM RN CM Interventions:  08/25/19 Case Collaboration    Received internal update from embedded Pharm D Lottie Dawson regarding 2 unsuccessful calls to [name]  Discussed patient will need to reapply for financial assistance for Trulicity in December and  will need to contact Gaspar Garbe D to notify and assist with this process  Patient Self Care Activities:  . Currently UNABLE TO independently manage DM . Self administers medications as prescribed . Attends all scheduled provider appointments . Calls pharmacy for medication refills . Attends church or other social activities . Performs ADL's independently .  Performs IADL's independently . Calls provider office for new concerns or questions  Please see past updates related to this goal by clicking on the "Past Updates" button in the selected goal      . My blood pressure is not as controlled as it used to be. (pt-stated)       Current Barriers:  . Uncontrolled hypertension, complicated by A7CU, HLD . Current antihypertensive regimen: atenolol/chlorthalidone 50-77m  . Previous antihypertensives tried: was also on amlodipine/benazepril 5-116mdaily  (Patient did not pick up refills,  She stated she would restart this medication.  This will likely bring her blood pressure back to goal <130/80) . Current home BP readings: 150/90s, 145/85  Pharmacist Clinical Goal(s):  . Marland Kitchenver the next 90 days, patient will work with PharmD and providers to optimize antihypertensive regimen  Interventions: . Comprehensive medication review performed; medication list updated in the electronic medical record.  . Counseled patient on dietary changes: decreased salt intake, decreased fried foods. . Counseled patient to continue taking medication as prescribed.  CCM RN CM Interventions: 08/25/19 Case Collaboration   . Received message from embedded Pharm D JuLottie Dawsondiscussed JuAlmyra Freeas made a couple of unsuccessful calls to Mrs. BaDonnella [name] an update related to the effectiveness of patient's newly prescribed BP meds (shes on two combo pills) (4 meds total now) when able to reach patient   Patient Self Care Activities:  . Patient will continue to check BP daily , document, and provide at future appointments . Patient will focus on medication adherence by picking up refills of all anti-hypertensive medications.  Please see past updates related to this goal by clicking on the "Past Updates" button in the selected goal         Telephone follow up appointment with care management team member scheduled for: 08/26/19  AnBarb MerinoRN, BSN, CCM Care  Management Coordinator [facility]  Direct Phone: [phone]

## 2019-09-04 ENCOUNTER — Other Ambulatory Visit: Payer: Self-pay | Admitting: Internal Medicine

## 2019-09-09 ENCOUNTER — Telehealth: Payer: Self-pay | Admitting: Pharmacist

## 2019-10-01 ENCOUNTER — Other Ambulatory Visit: Payer: Self-pay

## 2019-10-01 MED ORDER — ATENOLOL-CHLORTHALIDONE 50-25 MG PO TABS: ORAL_TABLET | ORAL | 0 refills | Status: DC

## 2019-10-01 MED ORDER — ALLOPURINOL 300 MG PO TABS: 300.0000 mg | ORAL_TABLET | Freq: Every day | ORAL | 0 refills | Status: DC

## 2019-10-01 MED ORDER — ROSUVASTATIN CALCIUM 20 MG PO TABS: 20.0000 mg | ORAL_TABLET | Freq: Every day | ORAL | 0 refills | Status: DC

## 2019-10-02 ENCOUNTER — Encounter: Payer: Self-pay | Admitting: Internal Medicine

## 2019-10-02 ENCOUNTER — Other Ambulatory Visit: Payer: Self-pay

## 2019-10-02 ENCOUNTER — Ambulatory Visit (INDEPENDENT_AMBULATORY_CARE_PROVIDER_SITE_OTHER): Payer: Medicare Other | Admitting: Internal Medicine

## 2019-10-02 VITALS — BP 124/78 | HR 65 | Temp 98.4°F | Ht 69.0 in | Wt 248.4 lb

## 2019-10-02 DIAGNOSIS — Z23 Encounter for immunization: Secondary | ICD-10-CM | POA: Diagnosis not present

## 2019-10-02 DIAGNOSIS — R1032 Left lower quadrant pain: Secondary | ICD-10-CM | POA: Diagnosis not present

## 2019-10-02 DIAGNOSIS — I129 Hypertensive chronic kidney disease with stage 1 through stage 4 chronic kidney disease, or unspecified chronic kidney disease: Secondary | ICD-10-CM | POA: Diagnosis not present

## 2019-10-02 DIAGNOSIS — M545 Low back pain, unspecified: Secondary | ICD-10-CM

## 2019-10-02 DIAGNOSIS — N183 Chronic kidney disease, stage 3 unspecified: Secondary | ICD-10-CM | POA: Diagnosis not present

## 2019-10-02 DIAGNOSIS — E1122 Type 2 diabetes mellitus with diabetic chronic kidney disease: Secondary | ICD-10-CM | POA: Diagnosis not present

## 2019-10-02 DIAGNOSIS — K219 Gastro-esophageal reflux disease without esophagitis: Secondary | ICD-10-CM

## 2019-10-02 LAB — POCT URINALYSIS DIPSTICK
Bilirubin, UA: NEGATIVE
Blood, UA: NEGATIVE
Glucose, UA: NEGATIVE
Ketones, UA: NEGATIVE
Leukocytes, UA: NEGATIVE
Nitrite, UA: NEGATIVE
Protein, UA: NEGATIVE
Spec Grav, UA: 1.015 (ref 1.010–1.025)
Urobilinogen, UA: 0.2 E.U./dL
pH, UA: 5.5 (ref 5.0–8.0)

## 2019-10-02 MED ORDER — PANTOPRAZOLE SODIUM 40 MG PO TBEC: 40.0000 mg | DELAYED_RELEASE_TABLET | Freq: Every day | ORAL | 1 refills | Status: DC

## 2019-10-02 NOTE — Patient Instructions (Signed)

## 2019-10-03 ENCOUNTER — Other Ambulatory Visit: Payer: Self-pay

## 2019-10-03 ENCOUNTER — Telehealth: Payer: Self-pay

## 2019-10-03 LAB — CMP14+EGFR
ALT: 25 IU/L (ref 0–32)
AST: 30 IU/L (ref 0–40)
Albumin/Globulin Ratio: 1.4 (ref 1.2–2.2)
Albumin: 4.3 g/dL (ref 3.7–4.7)
Alkaline Phosphatase: 65 IU/L (ref 39–117)
BUN/Creatinine Ratio: 22 (ref 12–28)
BUN: 23 mg/dL (ref 8–27)
Bilirubin Total: 0.6 mg/dL (ref 0.0–1.2)
CO2: 27 mmol/L (ref 20–29)
Calcium: 9.7 mg/dL (ref 8.7–10.3)
Chloride: 103 mmol/L (ref 96–106)
Creatinine, Ser: 1.05 mg/dL — ABNORMAL HIGH (ref 0.57–1.00)
GFR calc Af Amer: 59 mL/min/{1.73_m2} — ABNORMAL LOW (ref >59)
GFR calc non Af Amer: 51 mL/min/{1.73_m2} — ABNORMAL LOW (ref >59)
Globulin, Total: 3 g/dL (ref 1.5–4.5)
Glucose: 151 mg/dL — ABNORMAL HIGH (ref 65–99)
Potassium: 4.3 mmol/L (ref 3.5–5.2)
Sodium: 145 mmol/L — ABNORMAL HIGH (ref 134–144)
Total Protein: 7.3 g/dL (ref 6.0–8.5)

## 2019-10-03 LAB — HEMOGLOBIN A1C
Est. average glucose Bld gHb Est-mCnc: 140 mg/dL
Hgb A1c MFr Bld: 6.5 % — ABNORMAL HIGH (ref 4.8–5.6)

## 2019-10-03 NOTE — Telephone Encounter (Signed)
The pt was asked how she is doing and the pt said that she is doing good.

## 2019-10-08 NOTE — Progress Notes (Signed)
This visit occurred during the SARS-CoV-2 public health emergency.  Safety protocols were in place, including screening questions prior to the visit, additional usage of staff PPE, and extensive cleaning of exam room while observing appropriate contact time as indicated for disinfecting solutions.  Subjective:     Patient ID: [name] , female    DOB: [birth year] , 78 y.o.   MRN: [id number]   Chief Complaint  Patient presents with  . Abdominal Pain    HPI  She is here today for further evaluation of abdominal pain. She reports started early last week. She is not sure what precipitated her symptoms. She had "bad pains" in her lower belly. No nausea/vomiting/fever/chills. No ill contacts. No one in family has similar symptoms. The pain made it difficult for her to cook on Thanksgiving.   Abdominal Pain This is a new problem. The current episode started 1 to 4 weeks ago. The onset quality is gradual. The problem occurs daily. The problem has been gradually worsening. The pain is located in the LLQ. The pain is at a severity of 5/10. The pain is moderate. The quality of the pain is aching and dull. The abdominal pain radiates to the LLQ. Associated symptoms include belching. Pertinent negatives include no arthralgias, constipation or diarrhea. The pain is aggravated by certain positions. The pain is relieved by nothing.     Past Medical History:  Diagnosis Date  . Abdominal pain   . Arthritis    cervical disc degeneration/ oa left knee, carpal tunnel rt wrist, adhesive capsulitis right shoulder, rt hand weakness; lumbar degeneration  . Carpal tunnel syndrome   . 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 Constipation   . Diabetes mellitus without complication ([place])    borderline - diet control  . Diverticulitis hx of   . Diverticulosis   . E. coli infection    2020  . Frequent UTI    hx of urethral injury during colon surgery - states frequent uti's since  . GERD (gastroesophageal reflux disease)   . H/O hiatal hernia   . History of palpitations    in the past  . History of shingles    has a lingering itching on back where shingles were  . Hyperlipidemia   . Hypertension   . Numbness and tingling in right hand    pt. states has numbness of right hand very frequently-watch positioning  . Obesity   . Osteoarthritis   . Osteoporosis   . Pain    pain left knee and pain right hip and right groin  . Personal history of colonic polyps-adenoma 08/26/2008  . Pneumonia 2005  . Vitamin D deficiency      Family History  Problem Relation Age of Onset  . Breast cancer Mother 64  . Hypertension Mother   . Prostate cancer Father   . Hypertension Father   . Dementia Sister   . Lung cancer Brother   . Hypertension Brother   . COPD Brother   . Cancer Maternal Grandmother   . Arthritis Sister   . Hypertension Sister   . Arthritis Sister   . Hypertension Child   . Hypertension Child   . Hypertension Child   . Colon polyps Neg Hx   . Esophageal cancer Neg Hx   . Rectal cancer Neg Hx   .  Stomach cancer Neg Hx   . Colon cancer Neg Hx      Current Outpatient Medications:  .  allopurinol (ZYLOPRIM) 300 MG tablet, Take 1 tablet (300 mg total) by mouth daily., Disp: 90 tablet, Rfl: 0 .  amLODipine-benazepril (LOTREL) 5-10 MG capsule, TAKE 1 CAPSULE BY MOUTH EVERY DAY, Disp: 90 capsule, Rfl: 0 .  aspirin EC 81 MG tablet, Take 1 tablet (81 mg total) by mouth daily., Disp: 90 tablet, Rfl: 0 .  atenolol-chlorthalidone (TENORETIC) 50-25 MG tablet, TAKE 1 TABLET BY MOUTH EVERY DAY AT 11AM, Disp: 90 tablet, Rfl: 0 .  B Complex Vitamins (VITAMIN B COMPLEX PO), Take by mouth daily., Disp: , Rfl:  .  Dulaglutide (TRULICITY) 8.01 KP/5.3ZS SOPN, INJECT 0.5 ML(0.75 MG) SUBCUTANEOUSLY EVERY WEEK IN ABDOMEN, THIGH  OR UPPER ARM ROTATING INJECTION SITE, Disp: 2 mL, Rfl: 0 .  ONETOUCH DELICA LANCETS 82L MISC, Use to check blood glucose once daily, Disp: 100 each, Rfl: 3 .  ONETOUCH VERIO test strip, USE AS DIRECTED TO TEST BLOOD SUGAR BID, Disp: , Rfl:  .  OVER THE COUNTER MEDICATION, OTC immune system booster daily, Disp: , Rfl:  .  rosuvastatin (CRESTOR) 20 MG tablet, Take 1 tablet (20 mg total) by mouth daily., Disp: 90 tablet, Rfl: 0 .  Diclofenac Sodium (PENNSAID) 2 % SOLN, Apply topically., Disp: , Rfl:  .  NON FORMULARY, Blood balance formula Ordered from Dr. Irena Cords 1 capsule daily., Disp: , Rfl:  .  pantoprazole (PROTONIX) 40 MG tablet, Take 1 tablet (40 mg total) by mouth daily., Disp: 30 tablet, Rfl: 1 .  Probiotic Product (PROBIOTIC DAILY PO), Take 3 capsules by mouth daily., Disp: , Rfl:    Allergies  Allergen Reactions  . Atorvastatin Calcium Itching    Other reaction(s): Itching  . Bee Venom Hives  . Lipitor [Atorvastatin Calcium] Itching  . Oxycodone Other (See Comments)    hallucinations  . Vicodin [Hydrocodone-Acetaminophen] Other (See Comments)    hallucinations     Review of Systems  Constitutional: Negative.   Respiratory: Negative.   Cardiovascular: Negative.   Gastrointestinal: Positive for abdominal pain. Negative for constipation and diarrhea.  Musculoskeletal: Positive for back pain. Negative for arthralgias.  Neurological: Negative.   Psychiatric/Behavioral: Negative.      Today's Vitals   10/02/19 1021  BP: 124/78  Pulse: 65  Temp: 98.4 F (36.9 C)  TempSrc: Oral  Weight: 248 lb 6.4 oz (112.7 kg)  Height: '5\' 9"'  (1.753 m)  PainSc: 7   PainLoc: Abdomen   Body mass index is 36.68 kg/m.   Objective:  Physical Exam Vitals signs and nursing note reviewed.  Constitutional:      Appearance: Normal appearance. She is obese.  HENT:     Head: Normocephalic and atraumatic.  Cardiovascular:     Rate and Rhythm: Normal rate and regular rhythm.     Heart sounds:  Normal heart sounds.  Pulmonary:     Effort: Pulmonary effort is normal.     Breath sounds: Normal breath sounds.  Abdominal:     General: Bowel sounds are normal.     Palpations: Abdomen is soft.     Comments: Obese, no guarding. Difficult to assess organomegaly.   Skin:    General: Skin is warm.  Neurological:     General: No focal deficit present.     Mental Status: She is alert.  Psychiatric:        Mood and Affect: Mood normal.  Behavior: Behavior normal.         Assessment And Plan:     1. LLQ pain  It appears her sx may be muscular in nature. Abdominal exam is benign. She is advised to take magnesium nightly.   2. Type 2 diabetes mellitus with stage 3 chronic kidney disease, without long-term current use of insulin, unspecified whether stage 3a or 3b CKD (HCC)  Chronic. I will check labs as listed below. Importance of dietary and medication compliance was discussed with the patient.   - Hemoglobin A1c - CMP14+EGFR  3. Hypertensive nephropathy  Chronic, well controlled. She will continue with current meds. She is encouraged to avoid adding salt to her foods.   4. Left low back pain, unspecified chronicity, unspecified whether sciatica present  Chronic. Again, she may benefit from magnesium supplementation. If worsens, will consider PT evaluation. Dry needling would also probably be helpful.   - POCT Urinalysis Dipstick (81002)  5. Gastroesophageal reflux disease without esophagitis  Chronic. I will resend rx pantoprazole to take daily for next 60-90 days. I hope that I will be able to wean her off of the medication at that time.  6. Need for vaccination  - Flu vaccine HIGH DOSE PF (Fluzone High dose)        Maximino Greenland, MD    THE PATIENT IS ENCOURAGED TO PRACTICE SOCIAL DISTANCING DUE TO THE COVID-19 PANDEMIC.

## 2019-10-15 ENCOUNTER — Ambulatory Visit (INDEPENDENT_AMBULATORY_CARE_PROVIDER_SITE_OTHER): Payer: Medicare Other | Admitting: Pharmacist

## 2019-10-15 ENCOUNTER — Ambulatory Visit: Payer: Medicare Other | Admitting: Internal Medicine

## 2019-10-15 DIAGNOSIS — E1122 Type 2 diabetes mellitus with diabetic chronic kidney disease: Secondary | ICD-10-CM

## 2019-10-15 DIAGNOSIS — I10 Essential (primary) hypertension: Secondary | ICD-10-CM | POA: Diagnosis not present

## 2019-10-15 DIAGNOSIS — N183 Chronic kidney disease, stage 3 unspecified: Secondary | ICD-10-CM

## 2019-10-15 NOTE — Progress Notes (Signed)
Chronic Care Management   Visit Note  10/15/2019 Name: [name] MRN: [id number] DOB: [birth year]  Referred by: [name], MD Reason for referral : Chronic Care Management   [name] is a 78 y.o. year old female who is a primary care patient of [name], MD. The CCM team was consulted for assistance with chronic disease management and care coordination needs related to HTN, HLD and ESRD  Review of patient status, including review of consultants reports, relevant laboratory and other test results, and collaboration with appropriate care team members and the patient's provider was performed as part of comprehensive patient evaluation and provision of chronic care management services.    I spoke with [name] by telephone today.  Medications: Outpatient Encounter Medications as of 10/15/2019  Medication Sig  . allopurinol (ZYLOPRIM) 300 MG tablet Take 1 tablet (300 mg total) by mouth daily.  Marland Kitchen amLODipine-benazepril (LOTREL) 5-10 MG capsule TAKE 1 CAPSULE BY MOUTH EVERY DAY  . aspirin EC 81 MG tablet Take 1 tablet (81 mg total) by mouth daily.  Marland Kitchen atenolol-chlorthalidone (TENORETIC) 50-25 MG tablet TAKE 1 TABLET BY MOUTH EVERY DAY AT 11AM  . B Complex Vitamins (VITAMIN B COMPLEX PO) Take by mouth daily.  . Diclofenac Sodium (PENNSAID) 2 % SOLN Apply topically.  . Dulaglutide (TRULICITY) A999333 0000000 SOPN INJECT 0.5 ML(0.75 MG) SUBCUTANEOUSLY EVERY WEEK IN ABDOMEN, THIGH OR UPPER ARM ROTATING INJECTION SITE  . NON FORMULARY Blood balance formula Ordered from Dr. Irena Cords 1 capsule daily.  [name] DELICA LANCETS 99991111 MISC Use to check blood glucose once daily  . ONETOUCH VERIO test strip USE AS DIRECTED TO TEST BLOOD SUGAR BID  . OVER THE COUNTER MEDICATION OTC immune system booster daily  . pantoprazole (PROTONIX) 40 MG tablet Take 1 tablet (40 mg total) by mouth daily.  . Probiotic Product (PROBIOTIC DAILY PO) Take 3 capsules by mouth daily.  . rosuvastatin (CRESTOR) 20  MG tablet Take 1 tablet (20 mg total) by mouth daily.   No facility-administered encounter medications on file as of 10/15/2019.     Objective:   Goals Addressed            This Visit's Progress     Patient Stated   . I would like financial assistance with my Trulicity (pt-stated)       Current Barriers:  . Financial Barriers-patient cannot afford Trulicity through insurance.    Pharmacist Clinical Goal(s):  Marland Kitchen Over the next 30 days, patient will work with CCM Pharmacist to address needs related to applying for financial assistance (Trulicity)  -->RE-ESTABLISHED ON 10/15/2019  Interventions: . Comprehensive medication review performed. . Advised patient to continue taking medications as prescribed, checking BGs & eating a DM healthy diet. . Discussed plans with patient for ongoing care management follow up and provided patient with direct contact information for care management team  . Patient verbalizes understanding and would like to proceed with patient assistance application (Trulicity) FOR [id number]. Patient needs to mail in her proof of income.  Will mail patient application.  Will send provider portion. . Counseled on injection technique.  Patient reports compliance and denies adverse events.   Patient Self Care Activities:  . Self administers medications as prescribed . Attends all scheduled provider appointments . Calls pharmacy for medication refills  Please see past updates related to this goal by clicking on the "Past Updates" button in the selected goal      . I would like to keep my blood sugar at goal &  maintain my A1c (pt-stated)       Pharmacist Clinical Goal(s):  Marland Kitchen Over the next 90 days, patient with work with PharmD and primary care provider to address disease state management of diabetes  Current Barriers:  . Diabetes: T2DM; A1c 6.5% on 10/02/19 (was 6.4% on 03/19/19-->6.3% on 06/19/19)  Encouraged patient to increase water intake and try to get a few more steps  in  . Current antihyperglycemic regimen: Trulicity 0.75mg  once weekly (patient stable on this dose) One Touch Verio glucometer/supplies . Denies hypoglycemic symptoms, including dizziness, lightheadedness, shaking, sweating . Denies hyperglycemic symptoms, including polyuria, polydipsia, polyphagia, nocturia, blurred vision, neuropathy . Current exercise: walking . Current blood glucose readings: FBG 100-120, reports FBG<130; notes that BG is higher when patient eats desserts,etc . Cardiovascular risk reduction: o Current hypertensive regimen: amlodipine 5 /benazepril 10, Atenolol 50/chlorthalidone 25 (patient states her BP used to be better controlled when she was on combination therapy.  Upon reviewed of pharmacy claims, patient remains on 2 combination medications for BP listed above.  Will remind patient that she remains on these two combination medications for BP (total of 4 medications targeted for BP).  Last BP in office was 130/74.  Patient encouraged to continue taking BP at home and report back. o Current hyperlipidemia regimen: rosuvastatin 20 mg daily (getting 90 DS) o Patient continues to take daily ASA  Interventions: . Comprehensive medication review performed . Patient inquiring about multivitamin for immune support.  Recommended patient take a complete multivitamin (in addition to all basic vitamins-->one containing zinc, vitD are helpful for immune support/daily health).  Patient is also taking elderberry gummies to aid in immune support. . Reviewed ADA recommended "diabetes-friendly" diet  (reviewed healthy snack/food options)  Patient Self Care Activities:  . Patient will check blood glucose daily (AM) , document, and provide at future appointments . Patient will focus on medication adherence by 100% medication adherence, improved BP control, blood glucose at goal range . Patient will take medications as prescribed . Patient will contact provider with any episodes of  hypoglycemia . Patient will report any questions or concerns to provider    Please see past updates related to this goal by clicking on the "Past Updates" button in the selected goal          Plan:   The care management team will reach out to the patient again over the next 30 days.   Provider Signature Regina Eck, PharmD, BCPS Clinical Pharmacist, [facility] [place]: [phone]

## 2019-10-15 NOTE — Patient Instructions (Signed)
Visit Information  Goals Addressed            This Visit's Progress     Patient Stated   . I would like financial assistance with my Trulicity (pt-stated)       Current Barriers:  . Financial Barriers-patient cannot afford Trulicity through insurance.    Pharmacist Clinical Goal(s):  Marland Kitchen Over the next 30 days, patient will work with CCM Pharmacist to address needs related to applying for financial assistance (Trulicity)  -->RE-ESTABLISHED ON 10/15/2019  Interventions: . Comprehensive medication review performed. . Advised patient to continue taking medications as prescribed, checking BGs & eating a DM healthy diet. . Discussed plans with patient for ongoing care management follow up and provided patient with direct contact information for care management team  . Patient verbalizes understanding and would like to proceed with patient assistance application (Trulicity) FOR [id number]. Patient needs to mail in her proof of income.  Will mail patient application.  Will send provider portion. . Counseled on injection technique.  Patient reports compliance and denies adverse events.   Patient Self Care Activities:  . Self administers medications as prescribed . Attends all scheduled provider appointments . Calls pharmacy for medication refills  Please see past updates related to this goal by clicking on the "Past Updates" button in the selected goal      . I would like to keep my blood sugar at goal & maintain my A1c (pt-stated)       Pharmacist Clinical Goal(s):  Marland Kitchen Over the next 90 days, patient with work with PharmD and primary care provider to address disease state management of diabetes  Current Barriers:  . Diabetes: T2DM; A1c 6.5% on 10/02/19 (was 6.4% on 03/19/19-->6.3% on 06/19/19)  Encouraged patient to increase water intake and try to get a few more steps in  . Current antihyperglycemic regimen: Trulicity 0.75mg  once weekly (patient stable on this dose) One Touch Verio  glucometer/supplies . Denies hypoglycemic symptoms, including dizziness, lightheadedness, shaking, sweating . Denies hyperglycemic symptoms, including polyuria, polydipsia, polyphagia, nocturia, blurred vision, neuropathy . Current exercise: walking . Current blood glucose readings: FBG 100-120, reports FBG<130; notes that BG is higher when patient eats desserts,etc . Cardiovascular risk reduction: o Current hypertensive regimen: amlodipine 5 /benazepril 10, Atenolol 50/chlorthalidone 25 (patient states her BP used to be better controlled when she was on combination therapy.  Upon reviewed of pharmacy claims, patient remains on 2 combination medications for BP listed above.  Will remind patient that she remains on these two combination medications for BP (total of 4 medications targeted for BP).  Last BP in office was 130/74.  Patient encouraged to continue taking BP at home and report back. o Current hyperlipidemia regimen: rosuvastatin 20 mg daily (getting 90 DS) o Patient continues to take daily ASA  Interventions: . Comprehensive medication review performed . Patient inquiring about multivitamin for immune support.  Recommended patient take a complete multivitamin (in addition to all basic vitamins-->one containing zinc, vitD are helpful for immune support/daily health).  Patient is also taking elderberry gummies to aid in immune support. . Reviewed ADA recommended "diabetes-friendly" diet  (reviewed healthy snack/food options)  Patient Self Care Activities:  . Patient will check blood glucose daily (AM) , document, and provide at future appointments . Patient will focus on medication adherence by 100% medication adherence, improved BP control, blood glucose at goal range . Patient will take medications as prescribed . Patient will contact provider with any episodes of hypoglycemia . Patient will report  any questions or concerns to provider    Please see past updates related to this goal  by clicking on the "Past Updates" button in the selected goal         The patient verbalized understanding of instructions provided today and declined a print copy of patient instruction materials.   The care management team will reach out to the patient again over the next 30 days.   SIGNATURE Regina Eck, PharmD, BCPS Clinical Pharmacist, [facility] [place]: [phone]

## 2019-10-16 ENCOUNTER — Other Ambulatory Visit: Payer: Self-pay | Admitting: Pharmacy Technician

## 2019-10-16 NOTE — Patient Outreach (Signed)
[facility]) Care Management  10/16/2019  Daily Provins 1941-06-07 [id number]                                       Medication Assistance Referral  Referral From: [facility] Embedded RPh Jenne Pane.   Medication/Company: Danelle Berry / [employer] Patient application portion:  Mailed Provider application portion: Faxed  to Dr. [name] Provider address/fax verified via: Office website    Follow up:  Will follow up with patient in 10-14 business days to confirm application(s) have been received.  [name] [name] [place] Certified Pharmacy Technician [place] Management Direct Dial:[phone]

## 2019-10-21 ENCOUNTER — Ambulatory Visit: Payer: Medicare Other | Admitting: Internal Medicine

## 2019-10-30 ENCOUNTER — Encounter (HOSPITAL_BASED_OUTPATIENT_CLINIC_OR_DEPARTMENT_OTHER): Payer: Self-pay | Admitting: Radiology

## 2019-10-30 ENCOUNTER — Emergency Department (HOSPITAL_BASED_OUTPATIENT_CLINIC_OR_DEPARTMENT_OTHER): Payer: Medicare Other

## 2019-10-30 ENCOUNTER — Other Ambulatory Visit: Payer: Self-pay

## 2019-10-30 ENCOUNTER — Emergency Department (HOSPITAL_BASED_OUTPATIENT_CLINIC_OR_DEPARTMENT_OTHER)
Admission: EM | Admit: 2019-10-30 | Discharge: 2019-10-30 | Disposition: A | Discharge status: Home / Self Care | Payer: Medicare Other | Attending: Emergency Medicine | Admitting: Emergency Medicine

## 2019-10-30 DIAGNOSIS — K5730 Diverticulosis of large intestine without perforation or abscess without bleeding: Secondary | ICD-10-CM | POA: Diagnosis not present

## 2019-10-30 DIAGNOSIS — E119 Type 2 diabetes mellitus without complications: Secondary | ICD-10-CM | POA: Insufficient documentation

## 2019-10-30 DIAGNOSIS — Z7982 Long term (current) use of aspirin: Secondary | ICD-10-CM | POA: Insufficient documentation

## 2019-10-30 DIAGNOSIS — R0789 Other chest pain: Secondary | ICD-10-CM | POA: Diagnosis not present

## 2019-10-30 DIAGNOSIS — Z91030 Bee allergy status: Secondary | ICD-10-CM | POA: Insufficient documentation

## 2019-10-30 DIAGNOSIS — I10 Essential (primary) hypertension: Secondary | ICD-10-CM | POA: Diagnosis not present

## 2019-10-30 DIAGNOSIS — Z888 Allergy status to other drugs, medicaments and biological substances status: Secondary | ICD-10-CM | POA: Diagnosis not present

## 2019-10-30 DIAGNOSIS — K5792 Diverticulitis of intestine, part unspecified, without perforation or abscess without bleeding: Secondary | ICD-10-CM | POA: Diagnosis not present

## 2019-10-30 DIAGNOSIS — K5732 Diverticulitis of large intestine without perforation or abscess without bleeding: Secondary | ICD-10-CM | POA: Insufficient documentation

## 2019-10-30 DIAGNOSIS — Z885 Allergy status to narcotic agent status: Secondary | ICD-10-CM | POA: Diagnosis not present

## 2019-10-30 DIAGNOSIS — R079 Chest pain, unspecified: Secondary | ICD-10-CM | POA: Diagnosis not present

## 2019-10-30 DIAGNOSIS — R1032 Left lower quadrant pain: Secondary | ICD-10-CM | POA: Diagnosis present

## 2019-10-30 DIAGNOSIS — Z79899 Other long term (current) drug therapy: Secondary | ICD-10-CM | POA: Insufficient documentation

## 2019-10-30 DIAGNOSIS — K8020 Calculus of gallbladder without cholecystitis without obstruction: Secondary | ICD-10-CM | POA: Diagnosis not present

## 2019-10-30 DIAGNOSIS — E782 Mixed hyperlipidemia: Secondary | ICD-10-CM | POA: Insufficient documentation

## 2019-10-30 LAB — TROPONIN I (HIGH SENSITIVITY): Troponin I (High Sensitivity): 4 ng/L (ref <18)

## 2019-10-30 LAB — COMPREHENSIVE METABOLIC PANEL
ALT: 28 U/L (ref 0–44)
AST: 39 U/L (ref 15–41)
Albumin: 3.8 g/dL (ref 3.5–5.0)
Alkaline Phosphatase: 49 U/L (ref 38–126)
Anion gap: 11 (ref 5–15)
BUN: 17 mg/dL (ref 8–23)
CO2: 22 mmol/L (ref 22–32)
Calcium: 9.1 mg/dL (ref 8.9–10.3)
Chloride: 105 mmol/L (ref 98–111)
Creatinine, Ser: 1.13 mg/dL — ABNORMAL HIGH (ref 0.44–1.00)
GFR calc Af Amer: 54 mL/min — ABNORMAL LOW (ref >60)
GFR calc non Af Amer: 47 mL/min — ABNORMAL LOW (ref >60)
Glucose, Bld: 115 mg/dL — ABNORMAL HIGH (ref 70–99)
Potassium: 4.1 mmol/L (ref 3.5–5.1)
Sodium: 138 mmol/L (ref 135–145)
Total Bilirubin: 0.8 mg/dL (ref 0.3–1.2)
Total Protein: 7.4 g/dL (ref 6.5–8.1)

## 2019-10-30 LAB — URINALYSIS, MICROSCOPIC (REFLEX)

## 2019-10-30 LAB — CBC WITH DIFFERENTIAL/PLATELET
Abs Immature Granulocytes: 0.06 10*3/uL (ref 0.00–0.07)
Basophils Absolute: 0.1 10*3/uL (ref 0.0–0.1)
Basophils Relative: 1 %
Eosinophils Absolute: 0.4 10*3/uL (ref 0.0–0.5)
Eosinophils Relative: 3 %
HCT: 44.5 % (ref 36.0–46.0)
Hemoglobin: 14.8 g/dL (ref 12.0–15.0)
Immature Granulocytes: 1 %
Lymphocytes Relative: 26 %
Lymphs Abs: 2.8 10*3/uL (ref 0.7–4.0)
MCH: 30.4 pg (ref 26.0–34.0)
MCHC: 33.3 g/dL (ref 30.0–36.0)
MCV: 91.4 fL (ref 80.0–100.0)
Monocytes Absolute: 0.8 10*3/uL (ref 0.1–1.0)
Monocytes Relative: 7 %
Neutro Abs: 6.7 10*3/uL (ref 1.7–7.7)
Neutrophils Relative %: 62 %
Platelets: 277 10*3/uL (ref 150–400)
RBC: 4.87 MIL/uL (ref 3.87–5.11)
RDW: 13.7 % (ref 11.5–15.5)
WBC: 10.8 10*3/uL — ABNORMAL HIGH (ref 4.0–10.5)
nRBC: 0 % (ref 0.0–0.2)

## 2019-10-30 LAB — URINALYSIS, ROUTINE W REFLEX MICROSCOPIC
Bilirubin Urine: NEGATIVE
Glucose, UA: NEGATIVE mg/dL
Hgb urine dipstick: NEGATIVE
Ketones, ur: NEGATIVE mg/dL
Nitrite: NEGATIVE
Protein, ur: NEGATIVE mg/dL
Specific Gravity, Urine: <1.005 — ABNORMAL LOW (ref 1.005–1.030)
pH: 6 (ref 5.0–8.0)

## 2019-10-30 LAB — LIPASE, BLOOD: Lipase: 26 U/L (ref 11–51)

## 2019-10-30 IMAGING — CT CT ABD-PELV W/ CM
2 of 5 series · 16 of 46 positions shown, 18 images · IV contrast (omnipaque)
Comparison: [DATE]

CLINICAL DATA: Left side abdominal pain

EXAM:
CT ABDOMEN AND PELVIS WITH CONTRAST
TECHNIQUE: Multidetector CT imaging of the abdomen and pelvis was performed
using the standard protocol following bolus administration of
intravenous contrast.
CONTRAST:  100mL OMNIPAQUE IOHEXOL 300 MG/ML  SOLN

[Series 2: axial st · axial · 0.98mm/px · z∈[-474,-14]mm · 13 of 104 slices shown, 15 images]
[im 6/104  soft-tissue]
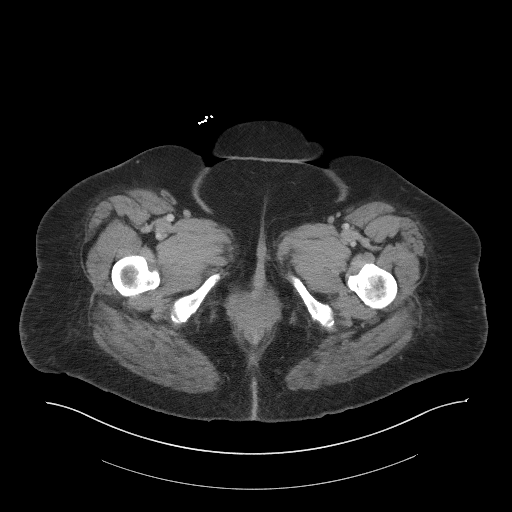
[im 6/104  bone]
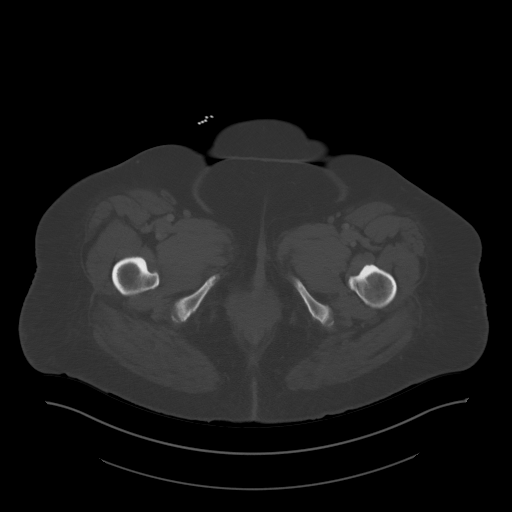
[im 12/104  soft-tissue]
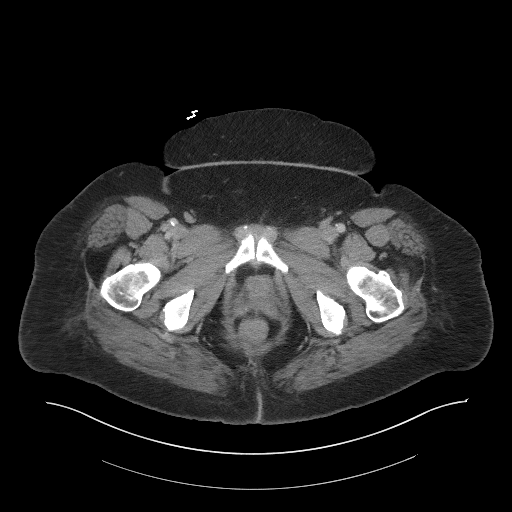
[im 23/104  soft-tissue]
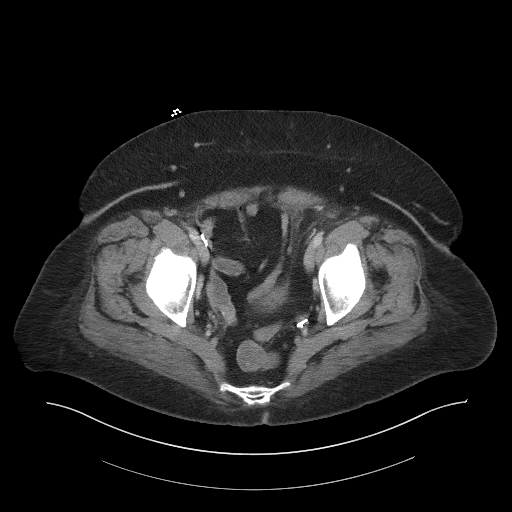
[im 29/104  soft-tissue]
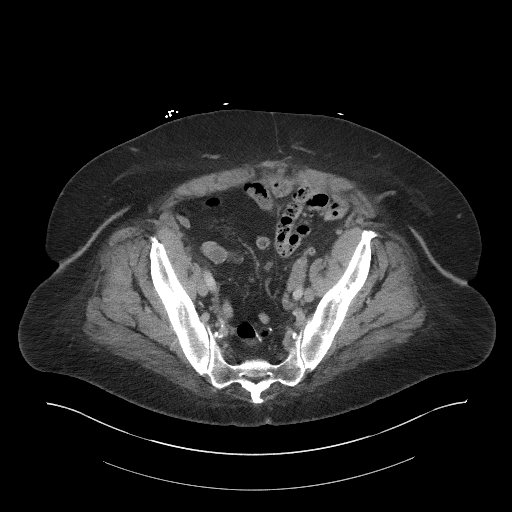
[im 35/104  soft-tissue]
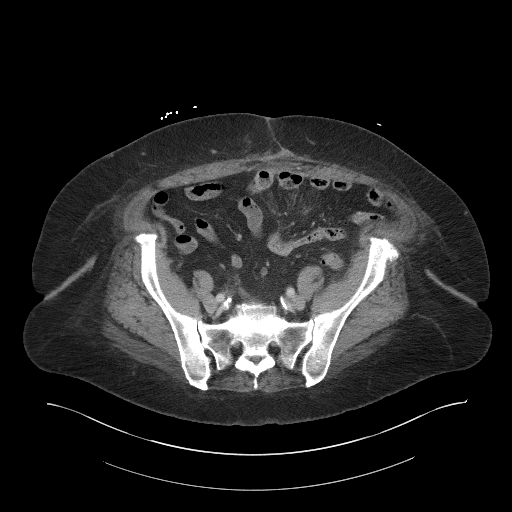
[im 46/104  soft-tissue]
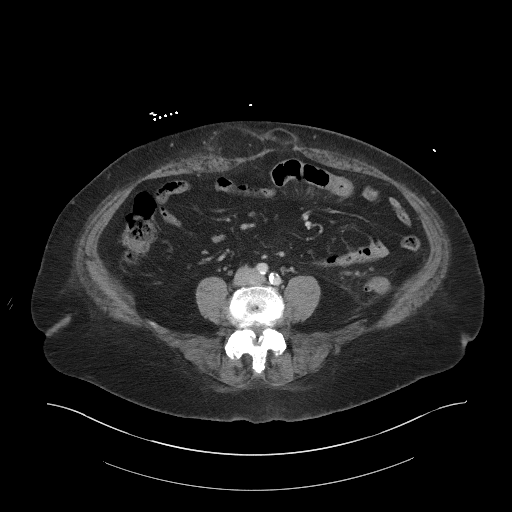
[im 52/104  soft-tissue]
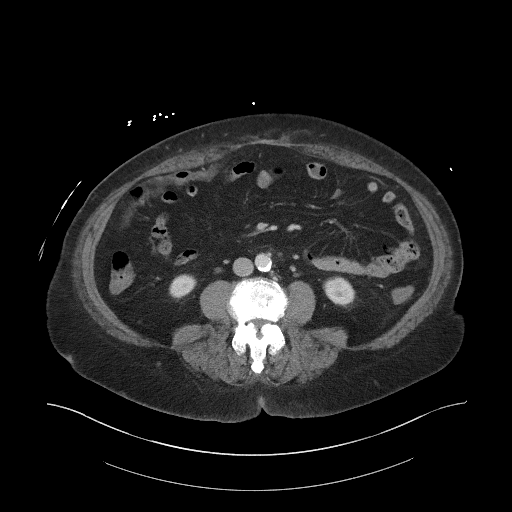
[im 58/104  soft-tissue]
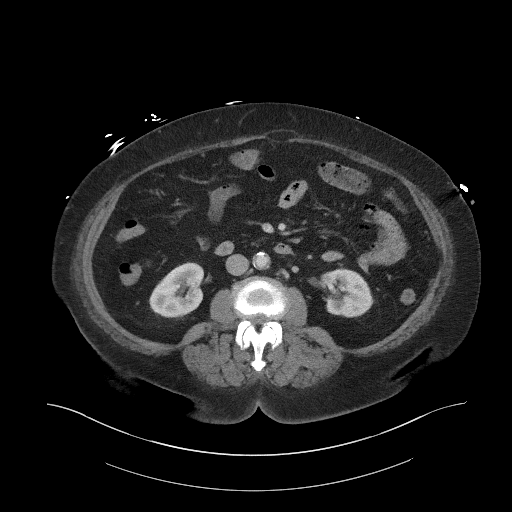
[im 69/104  soft-tissue]
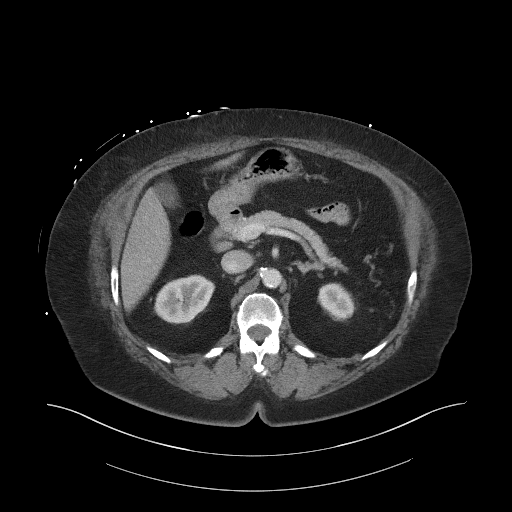
[im 69/104  bone]
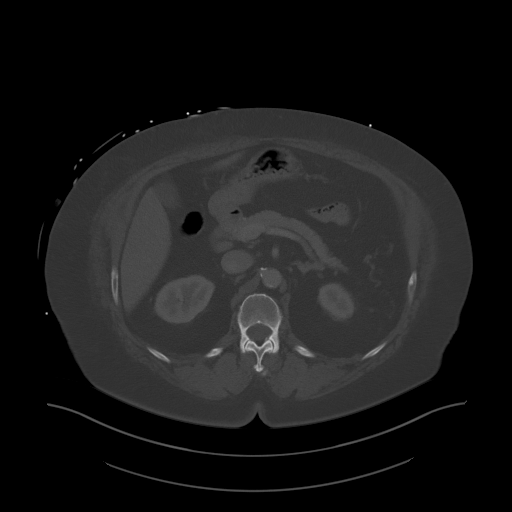
[im 75/104  soft-tissue]
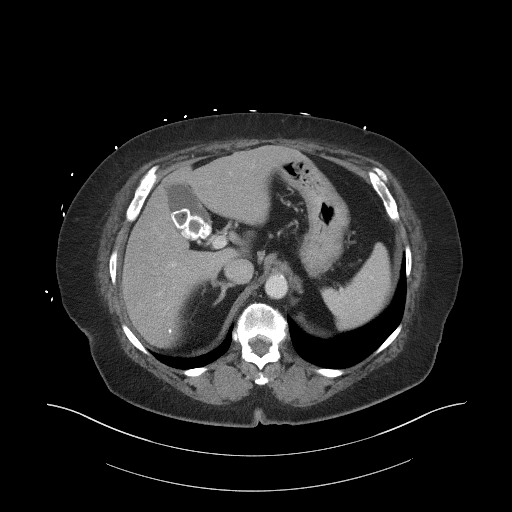
[im 81/104  soft-tissue]
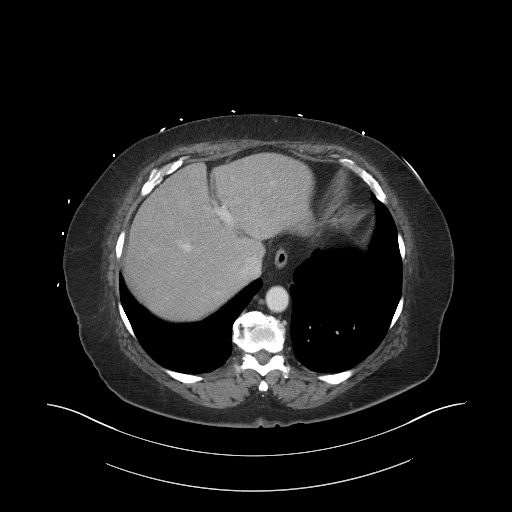
[im 92/104  soft-tissue]
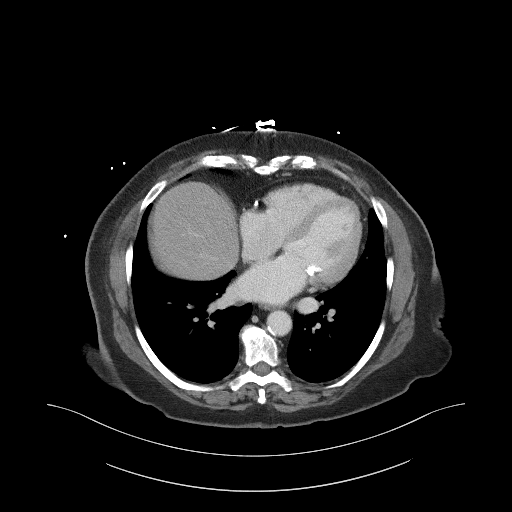
[im 98/104  soft-tissue]
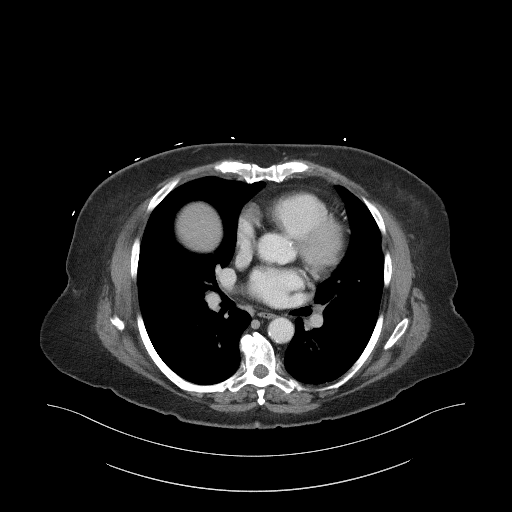

[Series 5: coronal st · coronal · 0.92mm/px · 3 of 106 slices shown]
[im 36/106  soft-tissue]
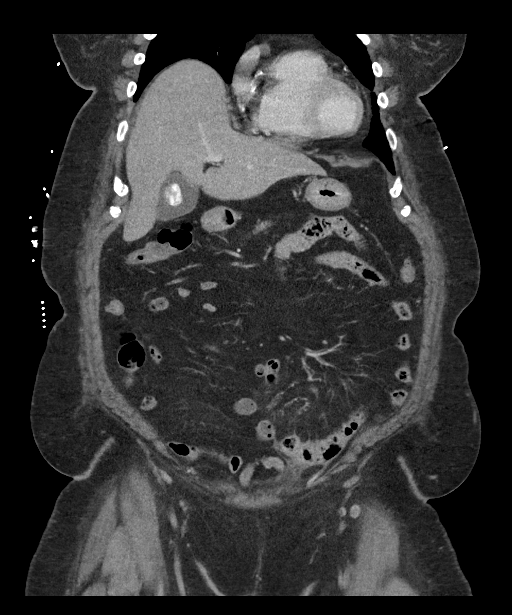
[im 47/106  soft-tissue]
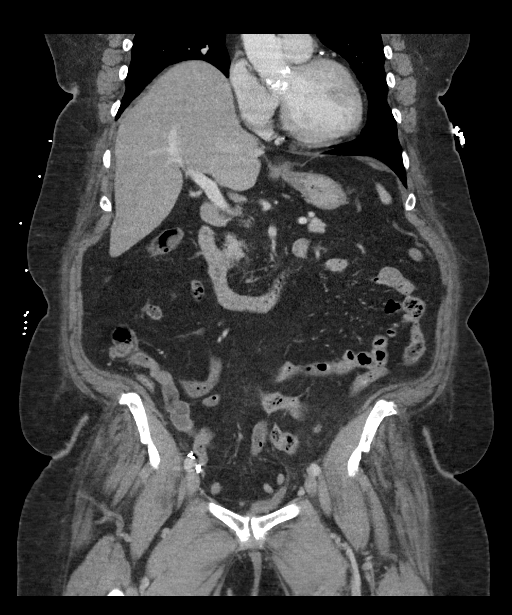
[im 59/106  soft-tissue]
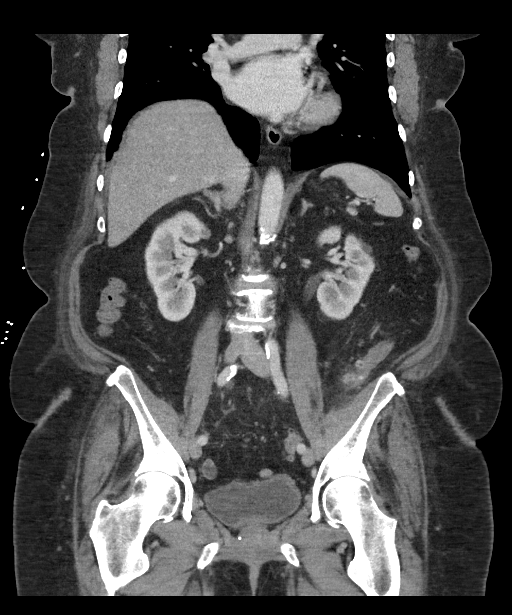

[16 of 46 positions shown; findings below may reference images not displayed]

FINDINGS: Lower chest: Coronary artery calcifications diffusely in the
visualized coronary arteries. Distal thoracic aortic
atherosclerosis. No visible aneurysm. Lung bases clear. No
effusions.

Hepatobiliary: Numerous gallstones within the gallbladder,
unchanged. Mild diffuse fatty infiltration throughout the liver. No
focal hepatic abnormality.

Pancreas: No focal abnormality or ductal dilatation.

Spleen: No focal abnormality.  Normal size.

Adrenals/Urinary Tract: No renal or adrenal mass. No hydronephrosis.
Urinary bladder unremarkable.

Stomach/Bowel: Postoperative changes in the sigmoid colon. There is
sigmoid and descending colonic diverticulosis. Inflammatory
stranding around the distal descending colon and proximal sigmoid
colon compatible with active diverticulitis. No bowel obstruction.
Stomach and small bowel grossly unremarkable. Appendix is normal.

Vascular/Lymphatic: Aortic atherosclerosis. No enlarged abdominal or
pelvic lymph nodes.

Reproductive: Prior hysterectomy.  No adnexal masses.

Other: No free fluid or free air. Supraumbilical ventral hernias
again noted containing fat, unchanged since prior study.

Musculoskeletal: Degenerative changes in the lumbar spine. No acute
bony abnormality.
IMPRESSION: Left colonic diverticulosis. Inflammatory stranding around the
distal descending colon and proximal sigmoid colon compatible with
active diverticulitis.

Cholelithiasis.

Fatty infiltration of the liver.

Aortic atherosclerosis.

Midline supraumbilical ventral hernias containing fat, stable.

## 2019-10-30 MED ORDER — AMOXICILLIN-POT CLAVULANATE 875-125 MG PO TABS: 1.0000 | ORAL_TABLET | Freq: Three times a day (TID) | ORAL | 0 refills | Status: AC

## 2019-10-30 MED ORDER — IOHEXOL 300 MG/ML  SOLN
100.0000 mL | Freq: Once | INTRAMUSCULAR | Status: AC | PRN
  Administered 2019-10-30: 100 mL via INTRAVENOUS

## 2019-10-30 MED ORDER — SODIUM CHLORIDE 0.9 % IV BOLUS
1000.0000 mL | Freq: Once | INTRAVENOUS | Status: AC
  Administered 2019-10-30: 1000 mL via INTRAVENOUS

## 2019-10-30 MED ORDER — AMOXICILLIN-POT CLAVULANATE 875-125 MG PO TABS
1.0000 | ORAL_TABLET | Freq: Once | ORAL | Status: AC
  Administered 2019-10-30: 1 via ORAL
  Filled 2019-10-30: qty 1

## 2019-10-30 NOTE — ED Triage Notes (Signed)
Patient reports abdominal pain which began 4-5 days ago.  Denies N/V/D.  Reports recently seen at PCP doctor and they told her "the food wasn't going through like it should".

## 2019-10-30 NOTE — ED Notes (Signed)
Spouse given update

## 2019-10-30 NOTE — ED Provider Notes (Signed)
[facility] EMERGENCY DEPARTMENT Provider Note   CSN: [id number] Arrival date & time: 10/30/19  1028     History Chief Complaint  Patient presents with  . Abdominal Pain    [name] is a 78 y.o. female.  HPI  78 year old female presents with abdominal discomfort and weakness.  The abdominal pain is vague and mostly in her left flank.  She states it has been there for a while but she is feeling worse over the past few days.  She has a complicated GI history including prior diverticulitis and partial colectomy which ended up with her urethra accidentally injured.  Chronic urine symptoms since.  No fevers, vomiting or diarrhea but she does have chronic constipation requiring meds to move her bowels.  Feels like she is not able to eat as much and feels weaker over the last few days.  She has some chronic chest pain that she states is unchanged for over 1 year. At the time I am talking to her, she denies abdominal pain.  Past Medical History:  Diagnosis Date  . Abdominal pain   . Arthritis    cervical disc degeneration/ oa left knee, carpal tunnel rt wrist, adhesive capsulitis right shoulder, rt hand weakness; lumbar degeneration  . Carpal tunnel syndrome   . 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 Constipation   . Diabetes mellitus without complication ([place])    borderline - diet control  . Diverticulitis hx of  . Diverticulosis   . E. coli infection    2020  . Frequent UTI    hx of urethral injury during colon surgery - states frequent uti's since  . GERD (gastroesophageal reflux disease)   . H/O hiatal hernia   . History of palpitations    in the past  . History of shingles    has a lingering itching on back where shingles were  . Hyperlipidemia   . Hypertension   . Numbness and tingling  in right hand    pt. states has numbness of right hand very frequently-watch positioning  . Obesity   . Osteoarthritis   . Osteoporosis   . Pain    pain left knee and pain right hip and right groin  . Personal history of colonic polyps-adenoma 08/26/2008  . Pneumonia 2005  . Vitamin D deficiency     Patient Active Problem List   Diagnosis Date Noted  . Abdominal bloating 02/17/2019  . Urinary frequency 02/17/2019  . Uncontrolled type 2 diabetes mellitus with hyperglycemia ([place]) 02/17/2019  . Lightheaded 02/17/2019  . Constipation 06/26/2018  . Hyperlipidemia 08/23/2014  . Type II or unspecified type diabetes mellitus without mention of complication, uncontrolled 06/19/2014  . Mixed hyperlipidemia 06/19/2014  . S/P urological surgery 09/30/2013  . Joint pain 09/30/2013  . UTI (urinary tract infection) 09/30/2013  . Cough 12/10/2012  . OA (osteoarthritis) of knee 01/05/2012  . 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 Vaginal discharge 08/02/2011  . Spasm of lumbar paraspinous muscle 06/17/2011  . Morbidly obese ([place]) 05/31/2011  . HIP PAIN, LEFT, CHRONIC 11/09/2010  . CARPAL TUNNEL SYNDROME, RIGHT 10/14/2010  . KNEE PAIN, RIGHT, CHRONIC 10/14/2010  . LEFT BUNDLE BRANCH BLOCK 07/26/2010  . 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 FATIGUE 07/01/2010  . CHEST PAIN, ATYPICAL 07/01/2010  . EPIGASTRIC PAIN 07/01/2010  .  SUPRAPUBIC PAIN 07/01/2010  . HYPERGLYCEMIA 07/01/2010  . CELLULITIS AND ABSCESS OF OTHER SPECIFIED SITE 02/15/2010  . LUMP OR MASS IN BREAST 01/10/2010  . DYSURIA, CHRONIC 03/09/2009  . GERD 10/06/2008  . Personal history of colonic polyps-adenoma 08/26/2008  . LOC OSTEOARTHROS NOT SPEC PRIM/SEC LOWER LEG 09/10/2007  . GOUT, UNSPECIFIED 09/09/2007  . DIVERTICULOSIS, COLON 09/09/2007  . DIVERTICULITIS, HX OF 09/09/2007  . HYPERLIPIDEMIA 06/03/2007  . Essential hypertension 06/03/2007    Past Surgical History:  Procedure Laterality Date   . ABDOMINAL HYSTERECTOMY    . bladder tack    . BREAST BIOPSY Left   . BREAST SURGERY     breast duct resection- benign  . CARPAL TUNNEL RELEASE Right 06/04/2014   Procedure: RIGHT CARPAL TUNNEL RELEASE;  Surgeon: Roseanne Kaufman, MD;  Location: [facility];  Service: Orthopedics;  Laterality: Right;  . COLON RESECTION  2008   hx diverticulosis  . COLON SURGERY    . COLONOSCOPY    . colonoscopy [id number]    . JOINT REPLACEMENT    . OOPHORECTOMY    . POLYPECTOMY    . skin graft left arm - traumatic compression injury left upper arm    . temporary ureter stent    . TOTAL KNEE ARTHROPLASTY  01/05/2012   Procedure: TOTAL KNEE ARTHROPLASTY;  Surgeon: [name], MD;  Location: [facility];  Service: Orthopedics;  Laterality: Right;  . TOTAL KNEE ARTHROPLASTY Left 08/17/2014   Procedure: LEFT TOTAL KNEE ARTHROPLASTY;  Surgeon: [name], MD;  Location: [facility];  Service: Orthopedics;  Laterality: Left;  . ureter repair for tyransected left ureter       OB History   No obstetric history on file.     Family History  Problem Relation Age of Onset  . Breast cancer Mother 105  . Hypertension Mother   . Prostate cancer Father   . Hypertension Father   . Dementia Sister   . Lung cancer Brother   . Hypertension Brother   . COPD Brother   . Cancer Maternal Grandmother   . Arthritis Sister   . Hypertension Sister   . Arthritis Sister   . Hypertension Child   . Hypertension Child   . Hypertension Child   . Colon polyps Neg Hx   . Esophageal cancer Neg Hx   . Rectal cancer Neg Hx   . Stomach cancer Neg Hx   . Colon cancer Neg Hx     Social History   Tobacco Use  . Smoking status: Never Smoker  . Smokeless tobacco: Never Used  Substance Use Topics  . Alcohol use: No  . Drug use: No    Home Medications Prior to Admission medications   Medication Sig Start Date End Date Taking? Authorizing Provider  allopurinol (ZYLOPRIM) 300 MG tablet Take 1 tablet  (300 mg total) by mouth daily. 10/01/19   [name], MD  amLODipine-benazepril (LOTREL) 5-10 MG capsule TAKE 1 CAPSULE BY MOUTH EVERY DAY 06/24/19   [name], MD  amoxicillin-clavulanate (AUGMENTIN) 875-125 MG tablet Take 1 tablet by mouth 3 (three) times daily for 7 days. 10/30/19 11/06/19  Sherwood Gambler, MD  aspirin EC 81 MG tablet Take 1 tablet (81 mg total) by mouth daily. 06/24/19   [name], MD  atenolol-chlorthalidone (TENORETIC) 50-25 MG tablet TAKE 1 TABLET BY MOUTH EVERY DAY AT 11AM 10/01/19   [name], MD  B Complex Vitamins (VITAMIN B COMPLEX PO) Take by mouth daily.    [provider]  Diclofenac Sodium (PENNSAID) 2 % SOLN Apply topically.    [provider]  Dulaglutide (TRULICITY) A999333 0000000 SOPN INJECT 0.5 ML(0.75 MG) SUBCUTANEOUSLY EVERY WEEK IN ABDOMEN, THIGH OR UPPER ARM ROTATING INJECTION SITE 07/22/19   [name], MD  NON FORMULARY Blood balance formula Ordered from Dr. Irena Cords 1 capsule daily.    [provider]  Jonetta Speak LANCETS [id number] MISC Use to check blood glucose once daily 06/19/14   Kennyth Arnold, FNP  [facility] VERIO test strip USE AS DIRECTED TO TEST BLOOD SUGAR BID 02/25/19   [provider]  OVER THE COUNTER MEDICATION OTC immune system booster daily    [provider]  pantoprazole (PROTONIX) 40 MG tablet Take 1 tablet (40 mg total) by mouth daily. 10/02/19 10/01/20  [name], MD  Probiotic Product (PROBIOTIC DAILY PO) Take 3 capsules by mouth daily.    [provider]  rosuvastatin (CRESTOR) 20 MG tablet Take 1 tablet (20 mg total) by mouth daily. 10/01/19   [name], MD    Allergies    Atorvastatin calcium, Bee venom, Lipitor [atorvastatin calcium], Oxycodone, and Vicodin [hydrocodone-acetaminophen]  Review of Systems   Review of Systems  Constitutional: Negative for fever.  Cardiovascular: Positive for chest pain.  Gastrointestinal: Positive for abdominal pain and  constipation. Negative for diarrhea, nausea and vomiting.  Musculoskeletal: Negative for back pain.  All other systems reviewed and are negative.   Physical Exam Updated Vital Signs BP (!) 156/70   Pulse 69   Temp 98.8 F (37.1 C) (Oral)   Resp 18   Ht 5\' 9"  (1.753 m)   Wt 111.6 kg   SpO2 98%   BMI 36.33 kg/m   Physical Exam Vitals and nursing note reviewed.  Constitutional:      General: She is not in acute distress.    Appearance: She is well-developed. She is obese. She is not ill-appearing or diaphoretic.  HENT:     Head: Normocephalic and atraumatic.     Right Ear: External ear normal.     Left Ear: External ear normal.     Nose: Nose normal.  Eyes:     General:        Right eye: No discharge.        Left eye: No discharge.  Cardiovascular:     Rate and Rhythm: Normal rate and regular rhythm.     Heart sounds: Normal heart sounds.  Pulmonary:     Effort: Pulmonary effort is normal.     Breath sounds: Normal breath sounds.  Abdominal:     Palpations: Abdomen is soft.     Tenderness: There is abdominal tenderness.       Comments: Mild tenderness in left abdomen  Skin:    General: Skin is warm and dry.  Neurological:     Mental Status: She is alert.  Psychiatric:        Mood and Affect: Mood is not anxious.     ED Results / Procedures / Treatments   Labs (all labs ordered are listed, but only abnormal results are displayed) Labs Reviewed  URINALYSIS, ROUTINE W REFLEX MICROSCOPIC - Abnormal; Notable for the following components:      Result Value   Specific Gravity, Urine <1.005 (*)    Leukocytes,Ua SMALL (*)    All other components within normal limits  COMPREHENSIVE METABOLIC PANEL - Abnormal; Notable for the following components:   Glucose, Bld 115 (*)    Creatinine, Ser 1.13 (*)  GFR calc non Af Amer 47 (*)    GFR calc Af Amer 54 (*)    All other components within normal limits  CBC WITH DIFFERENTIAL/PLATELET - Abnormal; Notable for the  following components:   WBC 10.8 (*)    All other components within normal limits  URINALYSIS, MICROSCOPIC (REFLEX) - Abnormal; Notable for the following components:   Bacteria, UA MANY (*)    All other components within normal limits  URINE CULTURE  LIPASE, BLOOD  TROPONIN I (HIGH SENSITIVITY)    EKG EKG Interpretation  Date/Time:  Thursday October 30 2019 11:04:56 EST Ventricular Rate:  71 PR Interval:    QRS Duration: 166 QT Interval:  478 QTC Calculation: 520 R Axis:   19 Text Interpretation: Sinus rhythm Left bundle branch block Confirmed by Sherwood Gambler [phone]) on 10/30/2019 11:17:17 AM   Radiology CT ABDOMEN PELVIS W CONTRAST  Result Date: 10/30/2019 CLINICAL DATA:  Left side abdominal pain EXAM: CT ABDOMEN AND PELVIS WITH CONTRAST TECHNIQUE: Multidetector CT imaging of the abdomen and pelvis was performed using the standard protocol following bolus administration of intravenous contrast. CONTRAST:  165mL OMNIPAQUE IOHEXOL 300 MG/ML  SOLN COMPARISON:  08/31/2018 FINDINGS: Lower chest: Coronary artery calcifications diffusely in the visualized coronary arteries. Distal thoracic aortic atherosclerosis. No visible aneurysm. Lung bases clear. No effusions. Hepatobiliary: Numerous gallstones within the gallbladder, unchanged. Mild diffuse fatty infiltration throughout the liver. No focal hepatic abnormality. Pancreas: No focal abnormality or ductal dilatation. Spleen: No focal abnormality.  Normal size. Adrenals/Urinary Tract: No renal or adrenal mass. No hydronephrosis. Urinary bladder unremarkable. Stomach/Bowel: Postoperative changes in the sigmoid colon. There is sigmoid and descending colonic diverticulosis. Inflammatory stranding around the distal descending colon and proximal sigmoid colon compatible with active diverticulitis. No bowel obstruction. Stomach and small bowel grossly unremarkable. Appendix is normal. Vascular/Lymphatic: Aortic atherosclerosis. No enlarged  abdominal or pelvic lymph nodes. Reproductive: Prior hysterectomy.  No adnexal masses. Other: No free fluid or free air. Supraumbilical ventral hernias again noted containing fat, unchanged since prior study. Musculoskeletal: Degenerative changes in the lumbar spine. No acute bony abnormality. IMPRESSION: Left colonic diverticulosis. Inflammatory stranding around the distal descending colon and proximal sigmoid colon compatible with active diverticulitis. Cholelithiasis. Fatty infiltration of the liver. Aortic atherosclerosis. Midline supraumbilical ventral hernias containing fat, stable. Electronically Signed   By: Rolm Baptise M.D.   On: 10/30/2019 12:01    Procedures Procedures (including critical care time)  Medications Ordered in ED Medications  amoxicillin-clavulanate (AUGMENTIN) 875-125 MG per tablet 1 tablet (has no administration in time range)  sodium chloride 0.9 % bolus 1,000 mL (1,000 mLs Intravenous New Bag/Given 10/30/19 1054)  iohexol (OMNIPAQUE) 300 MG/ML solution 100 mL (100 mLs Intravenous Contrast Given 10/30/19 1122)    ED Course  I have reviewed the triage vital signs and the nursing notes.  Pertinent labs & imaging results that were available during my care of the patient were reviewed by me and considered in my medical decision making (see chart for details).    MDM Rules/Calculators/A&P                      CT scan shows mild diverticulitis.  Mild leukocytosis but otherwise labs are benign.  With the generalized weakness/vague abdominal pain, troponin and ECG obtained but I doubt acute MI.  Will give Augmentin.  She otherwise appears amenable to outpatient treatment with no fever, vomiting, uncontrolled pain or complicated diverticulitis.  We discussed return precautions. Final Clinical Impression(s) / ED  Diagnoses Final diagnoses:  Acute diverticulitis    Rx / DC Orders ED Discharge Orders         Ordered    amoxicillin-clavulanate (AUGMENTIN) 875-125 MG  tablet  3 times daily     10/30/19 1224           Sherwood Gambler, MD 10/30/19 1227

## 2019-10-30 NOTE — ED Notes (Signed)
ED Provider at bedside. 

## 2019-10-30 NOTE — Discharge Instructions (Addendum)
If you develop worsening, continued, or recurrent abdominal pain, uncontrolled vomiting, fever, chest or back pain, or any other new/concerning symptoms then return to the ER for evaluation.  

## 2019-11-01 LAB — URINE CULTURE: Culture: >=100000 — AB

## 2019-11-03 ENCOUNTER — Other Ambulatory Visit: Payer: Self-pay

## 2019-11-03 ENCOUNTER — Ambulatory Visit (INDEPENDENT_AMBULATORY_CARE_PROVIDER_SITE_OTHER): Payer: Medicare Other | Admitting: Pharmacist

## 2019-11-03 ENCOUNTER — Encounter: Payer: Self-pay | Admitting: Internal Medicine

## 2019-11-03 ENCOUNTER — Ambulatory Visit (INDEPENDENT_AMBULATORY_CARE_PROVIDER_SITE_OTHER): Payer: Medicare Other | Admitting: Internal Medicine

## 2019-11-03 VITALS — BP 124/68 | HR 60 | Temp 97.9°F | Ht 69.0 in | Wt 244.4 lb

## 2019-11-03 DIAGNOSIS — I700 Atherosclerosis of aorta: Secondary | ICD-10-CM

## 2019-11-03 DIAGNOSIS — R0982 Postnasal drip: Secondary | ICD-10-CM | POA: Diagnosis not present

## 2019-11-03 DIAGNOSIS — N183 Chronic kidney disease, stage 3 unspecified: Secondary | ICD-10-CM

## 2019-11-03 DIAGNOSIS — E1122 Type 2 diabetes mellitus with diabetic chronic kidney disease: Secondary | ICD-10-CM

## 2019-11-03 DIAGNOSIS — K5901 Slow transit constipation: Secondary | ICD-10-CM | POA: Diagnosis not present

## 2019-11-03 DIAGNOSIS — K5732 Diverticulitis of large intestine without perforation or abscess without bleeding: Secondary | ICD-10-CM | POA: Diagnosis not present

## 2019-11-03 DIAGNOSIS — R5383 Other fatigue: Secondary | ICD-10-CM

## 2019-11-03 DIAGNOSIS — Z9189 Other specified personal risk factors, not elsewhere classified: Secondary | ICD-10-CM

## 2019-11-03 DIAGNOSIS — Z6836 Body mass index (BMI) 36.0-36.9, adult: Secondary | ICD-10-CM

## 2019-11-03 DIAGNOSIS — Z1152 Encounter for screening for COVID-19: Secondary | ICD-10-CM

## 2019-11-03 LAB — POCT GLUCOSE (DEVICE FOR HOME USE): POC Glucose: 127 mg/dl — AB (ref 70–99)

## 2019-11-03 MED ORDER — PENNSAID 2 % EX SOLN: 10.0000 [drp] | Freq: Two times a day (BID) | CUTANEOUS | 1 refills | Status: DC | PRN

## 2019-11-03 NOTE — Progress Notes (Signed)
Chronic Care Management    Visit Note  11/03/2019 Name: [name] MRN: [id number] DOB: Jun 04, 1941  Referred by: [name], MD Reason for referral : Chronic Care Management (Diabetes)   [name] is a 79 y.o. year old female who is a primary care patient of [name], MD. The CCM team was consulted for assistance with chronic disease management and care coordination needs related to DMII  Review of patient status, including review of consultants reports, relevant laboratory and other test results, and collaboration with appropriate care team members and the patient's provider was performed as part of comprehensive patient evaluation and provision of chronic care management services.    I met with [name] in clinic today.  Medications: Outpatient Encounter Medications as of 11/03/2019  Medication Sig  . allopurinol (ZYLOPRIM) 300 MG tablet Take 1 tablet (300 mg total) by mouth daily. (Patient not taking: Reported on 11/03/2019)  . amLODipine-benazepril (LOTREL) 5-10 MG capsule TAKE 1 CAPSULE BY MOUTH EVERY DAY  . amoxicillin-clavulanate (AUGMENTIN) 875-125 MG tablet Take 1 tablet by mouth 3 (three) times daily for 7 days.  Marland Kitchen aspirin EC 81 MG tablet Take 1 tablet (81 mg total) by mouth daily.  Marland Kitchen atenolol-chlorthalidone (TENORETIC) 50-25 MG tablet TAKE 1 TABLET BY MOUTH EVERY DAY AT 11AM  . Diclofenac Sodium (PENNSAID) 2 % SOLN Apply 10 drops topically 2 (two) times daily as needed.  . Dulaglutide (TRULICITY) 7.20 NO/7.0JG SOPN INJECT 0.5 ML(0.75 MG) SUBCUTANEOUSLY EVERY WEEK IN ABDOMEN, THIGH OR UPPER ARM ROTATING INJECTION SITE  . Multiple Vitamins-Minerals (ZINC PO) Take by mouth.  . NON FORMULARY Blood balance formula Ordered from Dr. Irena Cords 1 capsule daily.  [name] DELICA LANCETS 28Z MISC Use to check blood glucose once daily  . ONETOUCH VERIO test strip USE AS DIRECTED TO TEST BLOOD SUGAR BID  . OVER THE COUNTER MEDICATION OTC immune system booster daily  .  pantoprazole (PROTONIX) 40 MG tablet Take 1 tablet (40 mg total) by mouth daily.  . Probiotic Product (PROBIOTIC DAILY PO) Take 3 capsules by mouth daily.  . rosuvastatin (CRESTOR) 20 MG tablet Take 1 tablet (20 mg total) by mouth daily.   No facility-administered encounter medications on file as of 11/03/2019.     Objective:   Goals Addressed            This Visit's Progress     Patient Stated   . I would like to keep my blood sugar at goal & maintain my A1c (pt-stated)       Pharmacist Clinical Goal(s):  Marland Kitchen Over the next 90 days, patient with work with PharmD and primary care provider to address disease state management of diabetes  Current Barriers:  . Diabetes: T2DM; A1c 6.5% on 10/02/19 (was 6.4% on 03/19/19-->6.3% on 06/19/19)  Encouraged patient to increase water intake and try to get a few more steps in  . Current antihyperglycemic regimen: Trulicity 6.62HU once weekly (patient stable on this dose) One Touch Verio glucometer/supplies . Denies hypoglycemic symptoms, including dizziness, lightheadedness, shaking, sweating . Denies hyperglycemic symptoms, including polyuria, polydipsia, polyphagia, nocturia, blurred vision, neuropathy . Current exercise: walking . Current blood glucose readings: FBG 100-120, reports FBG<130; notes that BG is higher when patient eats desserts,etc . Cardiovascular risk reduction: o Current hypertensive regimen: amlodipine 5 /benazepril 10, Atenolol 50/chlorthalidone 25 (patient states her BP used to be better controlled when she was on combination therapy.  Upon reviewed of pharmacy claims, patient remains on 2 combination medications for BP listed above.  Will remind patient that she remains on these two combination medications for BP (total of 4 medications targeted for BP).  Last BP in office was 130/74.  Patient encouraged to continue taking BP at home and report back. o Current hyperlipidemia regimen: rosuvastatin 20 mg daily (getting 90  DS) o Patient continues to take daily ASA  Interventions: Clinic visit with patient on 11/03/2019 . Comprehensive medication review performed . Patient inquiring about multivitamin for immune support.  Recommended patient take a complete multivitamin (in addition to all basic vitamins-->one containing zinc, vitD are helpful for immune support/daily health).  Patient is also taking elderberry gummies to aid in immune support. . Reviewed ADA recommended "diabetes-friendly" diet  (reviewed healthy snack/food options) . 11/03/2019-->handouts provided on DM health diet, carbohydrate counting and cooking with diabetes   Patient Self Care Activities:  . Patient will check blood glucose daily (AM) , document, and provide at future appointments . Patient will focus on medication adherence by 100% medication adherence, improved BP control, blood glucose at goal range . Patient will take medications as prescribed . Patient will contact provider with any episodes of hypoglycemia . Patient will report any questions or concerns to provider    Please see past updates related to this goal by clicking on the "Past Updates" button in the selected goal         Plan:   The care management team will reach out to the patient again over the next 45 days.   Provider Signature Regina Eck, PharmD, BCPS Clinical Pharmacist, [facility] [place]: [phone]

## 2019-11-03 NOTE — Patient Instructions (Addendum)
Take Miralax daily to help have regular bowel movements  Constipation, Adult Constipation is when a person:  Poops (has a bowel movement) fewer times in a week than normal.  Has a hard time pooping.  Has poop that is dry, hard, or bigger than normal. Follow these instructions at home: Eating and drinking   Eat foods that have a lot of fiber, such as: ? Fresh fruits and vegetables. ? Whole grains. ? Beans.  Eat less of foods that are high in fat, low in fiber, or overly processed, such as: ? [place] fries. ? Hamburgers. ? Cookies. ? Candy. ? Soda.  Drink enough fluid to keep your pee (urine) clear or pale yellow. General instructions  Exercise regularly or as told by your doctor.  Go to the restroom when you feel like you need to poop. Do not hold it in.  Take over-the-counter and prescription medicines only as told by your doctor. These include any fiber supplements.  Do pelvic floor retraining exercises, such as: ? Doing deep breathing while relaxing your lower belly (abdomen). ? Relaxing your pelvic floor while pooping.  Watch your condition for any changes.  Keep all follow-up visits as told by your doctor. This is important. Contact a doctor if:  You have pain that gets worse.  You have a fever.  You have not pooped for 4 days.  You throw up (vomit).  You are not hungry.  You lose weight.  You are bleeding from the anus.  You have thin, pencil-like poop (stool). Get help right away if:  You have a fever, and your symptoms suddenly get worse.  You leak poop or have blood in your poop.  Your belly feels hard or bigger than normal (is bloated).  You have very bad belly pain.  You feel dizzy or you faint. This information is not intended to replace advice given to you by your health care provider. Make sure you discuss any questions you have with your health care provider. Document Revised: 09/28/2017 Document Reviewed: 04/05/2016 Elsevier  Patient Education  2020 [employer].

## 2019-11-03 NOTE — Progress Notes (Signed)
This visit occurred during the SARS-CoV-2 public health emergency.  Safety protocols were in place, including screening questions prior to the visit, additional usage of staff PPE, and extensive cleaning of exam room while observing appropriate contact time as indicated for disinfecting solutions.  Subjective:     Patient ID: [name] , female    DOB: Jul 14, 1941 , 79 y.o.   MRN: [id number]   Chief Complaint  Patient presents with  . Fatigue    HPI  She is here today for further evaluation of fatigue. Reports she went to ER on 12/31 for further evaluation of LLQ pain. States she was found to have diverticulitis. She was prescribed Augmentin. She reports compliance with abx. Reports pain has subsided some. She is now "scared" to eat b/c she is not sure what will cause her sx to flare. Admits she has not been eating much. Also with constipation. This is not a new symptom.    Past Medical History:  Diagnosis Date  . Abdominal pain   . Arthritis    cervical disc degeneration/ oa left knee, carpal tunnel rt wrist, adhesive capsulitis right shoulder, rt hand weakness; lumbar degeneration  . Carpal tunnel syndrome   . 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 Constipation   . Diabetes mellitus without complication ([place])    borderline - diet control  . Diverticulitis hx of  . Diverticulosis   . E. coli infection    2020  . Frequent UTI    hx of urethral injury during colon surgery - states frequent uti's since  . GERD (gastroesophageal reflux disease)   . H/O hiatal hernia   . History of palpitations    in the past  . History of shingles    has a lingering itching on back where shingles were  . Hyperlipidemia   . Hypertension   . Numbness and tingling in right hand    pt. states has numbness of right hand very  frequently-watch positioning  . Obesity   . Osteoarthritis   . Osteoporosis   . Pain    pain left knee and pain right hip and right groin  . Personal history of colonic polyps-adenoma 08/26/2008  . Pneumonia 2005  . Vitamin D deficiency      Family History  Problem Relation Age of Onset  . Breast cancer Mother 73  . Hypertension Mother   . Prostate cancer Father   . Hypertension Father   . Dementia Sister   . Lung cancer Brother   . Hypertension Brother   . COPD Brother   . Cancer Maternal Grandmother   . Arthritis Sister   . Hypertension Sister   . Arthritis Sister   . Hypertension Child   . Hypertension Child   . Hypertension Child   . Colon polyps Neg Hx   . Esophageal cancer Neg Hx   . Rectal cancer Neg Hx   . Stomach cancer Neg Hx   . Colon cancer Neg Hx      Current Outpatient Medications:  .  amLODipine-benazepril (LOTREL) 5-10 MG capsule, TAKE 1 CAPSULE BY MOUTH EVERY DAY, Disp: 90 capsule, Rfl: 0 .  amoxicillin-clavulanate (AUGMENTIN) 875-125 MG tablet, Take 1 tablet by mouth 3 (three) times daily for 7 days., Disp: 21 tablet, Rfl: 0 .  aspirin EC 81 MG tablet, Take  1 tablet (81 mg total) by mouth daily., Disp: 90 tablet, Rfl: 0 .  atenolol-chlorthalidone (TENORETIC) 50-25 MG tablet, TAKE 1 TABLET BY MOUTH EVERY DAY AT 11AM, Disp: 90 tablet, Rfl: 0 .  Dulaglutide (TRULICITY) A999333 0000000 SOPN, INJECT 0.5 ML(0.75 MG) SUBCUTANEOUSLY EVERY WEEK IN ABDOMEN, THIGH OR UPPER ARM ROTATING INJECTION SITE, Disp: 2 mL, Rfl: 0 .  Multiple Vitamins-Minerals (ZINC PO), Take by mouth., Disp: , Rfl:  .  ONETOUCH DELICA LANCETS 99991111 MISC, Use to check blood glucose once daily, Disp: 100 each, Rfl: 3 .  ONETOUCH VERIO test strip, USE AS DIRECTED TO TEST BLOOD SUGAR BID, Disp: , Rfl:  .  OVER THE COUNTER MEDICATION, OTC immune system booster daily, Disp: , Rfl:  .  pantoprazole (PROTONIX) 40 MG tablet, Take 1 tablet (40 mg total) by mouth daily., Disp: 30 tablet, Rfl: 1 .   Probiotic Product (PROBIOTIC DAILY PO), Take 3 capsules by mouth daily., Disp: , Rfl:  .  rosuvastatin (CRESTOR) 20 MG tablet, Take 1 tablet (20 mg total) by mouth daily., Disp: 90 tablet, Rfl: 0 .  allopurinol (ZYLOPRIM) 300 MG tablet, Take 1 tablet (300 mg total) by mouth daily. (Patient not taking: Reported on 11/03/2019), Disp: 90 tablet, Rfl: 0 .  Diclofenac Sodium (PENNSAID) 2 % SOLN, Apply 10 drops topically 2 (two) times daily as needed., Disp: 112 g, Rfl: 1 .  NON FORMULARY, Blood balance formula Ordered from Dr. Irena Cords 1 capsule daily., Disp: , Rfl:    Allergies  Allergen Reactions  . Atorvastatin Calcium Itching    Other reaction(s): Itching  . Bee Venom Hives  . Lipitor [Atorvastatin Calcium] Itching  . Oxycodone Other (See Comments)    hallucinations  . Vicodin [Hydrocodone-Acetaminophen] Other (See Comments)    hallucinations     Review of Systems  Constitutional: Positive for fatigue.  HENT: Positive for postnasal drip.        Wants to be tested for COVID today. Reports she is around a lot of people at work. No fever/chills.   Respiratory: Negative.   Cardiovascular: Negative.   Gastrointestinal: Positive for abdominal pain and constipation.  Neurological: Negative.   Psychiatric/Behavioral: Negative.      Today's Vitals   11/03/19 1212  BP: 124/68  Pulse: 60  Temp: 97.9 F (36.6 C)  TempSrc: Oral  Weight: 244 lb 6.4 oz (110.9 kg)  Height: 5\' 9"  (1.753 m)   Body mass index is 36.09 kg/m.   Objective:  Physical Exam Vitals and nursing note reviewed.  Constitutional:      Appearance: Normal appearance. She is obese.  HENT:     Head: Normocephalic and atraumatic.     Nose:     Comments: Deferred, masked    Mouth/Throat:     Comments: Deferred, masked Cardiovascular:     Rate and Rhythm: Normal rate and regular rhythm.     Heart sounds: Normal heart sounds.  Pulmonary:     Effort: Pulmonary effort is normal.     Breath sounds: Normal breath sounds.   Abdominal:     General: Bowel sounds are normal.     Palpations: Abdomen is soft.     Tenderness: There is abdominal tenderness in the left lower quadrant.  Skin:    General: Skin is warm.  Neurological:     General: No focal deficit present.     Mental Status: She is alert.  Psychiatric:        Mood and Affect: Mood normal.  Behavior: Behavior normal.         Assessment And Plan:     1. Fatigue, unspecified type  BS is within normal limits. She is encouraged to stay well hydrated and to follow a bland diet - free of nuts/seeds.   - POC Glucose (dx code Z13.29)  2. Diverticulitis of large intestine without perforation or abscess without bleeding  Acute flare of chronic condition. She is encouraged to complete full abx course. CT scan results reviewed with the patient in detail.   3. Type 2 diabetes mellitus with stage 3 chronic kidney disease, without long-term current use of insulin, unspecified whether stage 3a or 3b CKD (HCC)  Chronic. She requested cooking tips - I offered to refer her to Nutrition, but she declined. She plans to contact CCM Nurse for further guidance/information.   4. Slow transit constipation  Chronic. She was advised to increase her daily activity as tolerated. Also advised her to start Miralax daily. If no relief, I will consider use of Linzess.   5. Postnasal drip  I will check COVID test as requested.   - Novel Coronavirus, NAA ([facility])  6. Aortic atherosclerosis (HCC)  Chronic, seen on CT scan. Importance of statin compliance was discussed with the patient.   7. Class 2 severe obesity due to excess calories with serious comorbidity and body mass index (BMI) of 36.0 to 36.9 in adult [facility])  She is encouraged to strive for BMI less than 30 to decrease cardiac risk.   8. Need for dental care  I will refer her to dentist as requested.   - Ambulatory referral to Dentistry       Maximino Greenland, MD    THE PATIENT IS ENCOURAGED  TO PRACTICE SOCIAL DISTANCING DUE TO THE COVID-19 PANDEMIC.

## 2019-11-04 ENCOUNTER — Other Ambulatory Visit: Payer: Self-pay

## 2019-11-04 ENCOUNTER — Telehealth: Payer: Self-pay

## 2019-11-04 ENCOUNTER — Other Ambulatory Visit: Payer: Self-pay | Admitting: Pharmacy Technician

## 2019-11-04 ENCOUNTER — Ambulatory Visit: Payer: Self-pay

## 2019-11-04 DIAGNOSIS — E1122 Type 2 diabetes mellitus with diabetic chronic kidney disease: Secondary | ICD-10-CM | POA: Diagnosis not present

## 2019-11-04 DIAGNOSIS — N183 Chronic kidney disease, stage 3 unspecified: Secondary | ICD-10-CM

## 2019-11-04 DIAGNOSIS — I10 Essential (primary) hypertension: Secondary | ICD-10-CM | POA: Diagnosis not present

## 2019-11-04 LAB — NOVEL CORONAVIRUS, NAA: SARS-CoV-2, NAA: NOT DETECTED

## 2019-11-04 NOTE — Patient Outreach (Signed)
[facility]) Care Management  11/04/2019  Brittiany Messman 03-11-1941 [id number]   Received patient portion(s) of patient assistance application(s) for Trulicity. Faxed completed application and required documents into [employer].  Will follow up with company(ies) in 10-14 business days to check status of application(s).  [name] [name] [place] Certified Pharmacy Technician [place] Management Direct [phone]

## 2019-11-05 ENCOUNTER — Ambulatory Visit: Payer: Medicare Other | Admitting: Internal Medicine

## 2019-11-05 NOTE — Chronic Care Management (AMB) (Signed)
Chronic Care Management   Follow Up Note   11/04/2019 Name: [name] MRN: [id number] DOB: April 29, 1941  Referred by: [name], MD Reason for referral : Chronic Care Management (CCM RNCM Telephone Follow up )   [name] is a 79 y.o. year old female who is a primary care patient of [name], MD. The CCM team was consulted for assistance with chronic disease management and care coordination needs.    Review of patient status, including review of consultants reports, relevant laboratory and other test results, and collaboration with appropriate care team members and the patient's provider was performed as part of comprehensive patient evaluation and provision of chronic care management services.    SDOH (Social Determinants of Health) screening performed today: Financial Strain to help cover cost of dental care needed. See Care Plan for related entries.   Placed an outbound call to [name] for a CCM RN CM follow up.   Outpatient Encounter Medications as of 11/04/2019  Medication Sig  . allopurinol (ZYLOPRIM) 300 MG tablet Take 1 tablet (300 mg total) by mouth daily. (Patient not taking: Reported on 11/03/2019)  . amLODipine-benazepril (LOTREL) 5-10 MG capsule TAKE 1 CAPSULE BY MOUTH EVERY DAY  . amoxicillin-clavulanate (AUGMENTIN) 875-125 MG tablet Take 1 tablet by mouth 3 (three) times daily for 7 days.  Marland Kitchen aspirin EC 81 MG tablet Take 1 tablet (81 mg total) by mouth daily.  Marland Kitchen atenolol-chlorthalidone (TENORETIC) 50-25 MG tablet TAKE 1 TABLET BY MOUTH EVERY DAY AT 11AM  . Diclofenac Sodium (PENNSAID) 2 % SOLN Apply 10 drops topically 2 (two) times daily as needed.  . Dulaglutide (TRULICITY) 9.23 RA/0.7MA SOPN INJECT 0.5 ML(0.75 MG) SUBCUTANEOUSLY EVERY WEEK IN ABDOMEN, THIGH OR UPPER ARM ROTATING INJECTION SITE  . Multiple Vitamins-Minerals (ZINC PO) Take by mouth.  . NON FORMULARY Blood balance formula Ordered from Dr. Irena Cords 1 capsule daily.  [name] DELICA LANCETS 26J  MISC Use to check blood glucose once daily  . ONETOUCH VERIO test strip USE AS DIRECTED TO TEST BLOOD SUGAR BID  . OVER THE COUNTER MEDICATION OTC immune system booster daily  . pantoprazole (PROTONIX) 40 MG tablet Take 1 tablet (40 mg total) by mouth daily.  . Probiotic Product (PROBIOTIC DAILY PO) Take 3 capsules by mouth daily.  . rosuvastatin (CRESTOR) 20 MG tablet Take 1 tablet (20 mg total) by mouth daily.   No facility-administered encounter medications on file as of 11/04/2019.     Goals Addressed      Patient Stated   . COMPLETED: "I have a cough and sore throat" (pt-stated)       Current Barriers:  Marland Kitchen Knowledge Deficits related to diagnosis and treatment for cough and sore throat  Nurse Case Manager Clinical Goal(s):  Marland Kitchen Over the next 14 days, patient will verbalize understanding of plan for COVID testing and following up.   Goal Met  CCM RN CM Interventions:  11/04/19 call completed with patient   . Evaluation of current treatment plan related to cough and sore throat and patient's adherence to plan as established by provider . Determined patient currently does not have symptoms suggestive of infection; discussed she requested a COVID test for "a peace of mind" . Determined patient is limiting her activities outside her home and is using COVID precautions  Patient Self Care Activities:  . Self administers medications as prescribed . Attends all scheduled provider appointments . Calls pharmacy for medication refills . Attends church or other social activities . Performs ADL's independently .  Performs IADL's independently . Calls provider office for new concerns or questions  Please see past updates related to this goal by clicking on the "Past Updates" button in the selected goal      . 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  . Chronic Disease Management support and education needs related to HTN, DMII, CKDIII, dental decay   Nurse Case Manager Clinical Goal(s):  Marland Kitchen Over the next 90 days, patient will work with the CCM team to address needs related to dental care and resources to help with cost of dental care not covered by health plan   Interventions:  . Evaluation of current treatment plan related to new referral for dentistry consultation and patient's adherence to plan as established by provider. . Advised patient to contact the Dr. Iantha [name] to schedule her new patient appointment for evaluation of dental care needed; provided patient with the contact information and physical address . Collaborated with embedded BSW [name] regarding patient's request to receive any/all resources available that may help cover the cost for dental care not covered by her health plan  . Discussed plans with patient for ongoing care management follow up and provided patient with direct contact information for care management team  Patient Self Care Activities:  . Patient verbalizes understanding of plan to contact Dr. Iantha [name] to schedule a new patient dental appointment  . Self administers medications as prescribed . Attends all scheduled provider appointments . Calls pharmacy for medication refills . Performs ADL's independently . Performs IADL's independently . Calls provider office for new concerns or questions  Initial goal documentation     . COMPLETED: "I want to get my sugar under control" (pt-stated)       Current Barriers:  Marland Kitchen Knowledge Deficits related to DM disease management. [name] states she doesn't like having to check her blood sugar or take medication for diabetes so she wants to work hard to get her blood sugar under control. We discussed the value of good glucose management whether she needs to stay on medication long term. She is very eager to learn  about self health management strategies related to DM.   Nurse Case Manager Clinical Goal(s):  Marland Kitchen Over the next 90 days, patient will verbalize understanding of plan for self health management strategies related to diabetes.  03/31/19 re-establish goal date due to treatment delays from COVID-19 Goal Met . Over the next 14 days, patient will activate nutrition strategies to promote improved cbg control: manage portion sizes utilizing plate method, journal cbg response to meals, increase vegetable intake  03/31/19 Goal Met . Over the next 14 days, patient will maintain or improve daily exercise/walking regimen  03/31/19 Goal Met  CCM RN CM Interventions:  11/04/19 spoke with patient   . Determined patient is adhering to her DM treatment plan and maintaining her A1C around 6.5 last checked on 10/02/19 . Discussed patient continues to follow a diabetic friendly diet, she is taking her medications as prescribed and is working with the embedded Pharm D to obtain financial assistance for [employer] . Reinforced importance of ongoing Self monitoring of CBG's daily before meals; reiterated target daily glycemic control of 80-130 . Mailed printed DM patient materials related to "Carb Counting"; "Carb Choices"; "Managing Diabetes Meal Planning"; "Manage Your Diabetes Zone Safety  Tool"  Patient Self Care Activities:  . Currently UNABLE TO independently manage DM . Self administers medications as prescribed . Attends all scheduled provider appointments . Calls pharmacy for medication refills . Attends church or other social activities . Performs ADL's independently . Performs IADL's independently . Calls provider office for new concerns or questions  Please see past updates related to this goal by clicking on the "Past Updates" button in the selected goal      . I would like financial assistance with my Trulicity (pt-stated)   On track    Current Barriers:  . Financial Barriers-patient cannot afford  Trulicity through insurance.    Pharmacist Clinical Goal(s):  Marland Kitchen Over the next 30 days, patient will work with CCM Pharmacist to address needs related to applying for financial assistance (Trulicity)  -->RE-ESTABLISHED ON 10/15/2019  Interventions: . Comprehensive medication review performed. . Advised patient to continue taking medications as prescribed, checking BGs & eating a DM healthy diet. . Discussed plans with patient for ongoing care management follow up and provided patient with direct contact information for care management team  . Patient verbalizes understanding and would like to proceed with patient assistance application (Trulicity) FOR 4081. Patient needs to mail in her proof of income.  Will mail patient application.  Will send provider portion. . Counseled on injection technique.  Patient reports compliance and denies adverse events.   Patient Self Care Activities:  . Self administers medications as prescribed . Attends all scheduled provider appointments . Calls pharmacy for medication refills  Please see past updates related to this goal by clicking on the "Past Updates" button in the selected goal      . COMPLETED: My blood pressure is not as controlled as it used to be. (pt-stated)       Current Barriers:  . Uncontrolled hypertension, complicated by K4YJ, HLD . Current antihypertensive regimen: atenolol/chlorthalidone 50-15m  . Previous antihypertensives tried: was also on amlodipine/benazepril 5-149mdaily  (Patient did not pick up refills,  She stated she would restart this medication.  This will likely bring her blood pressure back to goal <130/80) . Current home BP readings: 150/90s, 145/85  Pharmacist Clinical Goal(s):  . Marland Kitchenver the next 90 days, patient will work with PharmD and providers to optimize antihypertensive regimen  CCM RN CM Interventions: 11/04/19 call completed with patient . Assessed for medication adherence; determined patient is adhering to  taking her medications as prescribed without missed doses . Reinforced the importance of adherence to following a low sodium diet, avoid fried foods . Discussed patient is satisfied with her BP control; discussed patient is at goal with recent BP noted on 11/03/19 BP of 124/68 . Discussed target BP of 130/80; patient is self monitoring and verbalizes understanding of when to call the doctor with BP concerns . Mailed printed patient educational materials related to "What is High Blood Pressure"; "African Americans and High Blood Pressure"; "Why Should I Restrict Sodium?"; "Life's Simple 7"  Patient Self Care Activities:  . Patient will continue to check BP daily , document, and provide at future appointments . Patient will focus on medication adherence by picking up refills of all anti-hypertensive medications.  Please see past updates related to this goal by clicking on the "Past Updates" button in the selected goal        Other   . To better manage my constipation       Current Barriers:  . Marland Kitchennowledge Deficits related to treatment management for constipation . Chronic  Disease Management support and education needs related to HTN, DMII, CKDIII, constipation   Nurse Case Manager Clinical Goal(s):  Marland Kitchen Over the next 90 days, patient will work with the [facility] CM and PCP to address needs related to impaired bowel elimination/constipation  . Over the next 90 days, patient will experience decrease in ED visits. ED visits in last 6 months = 1 ED visit on 10/30/19; dx: acute diverticulitis  Interventions:  . Evaluation of current treatment plan related to constipation and patient's adherence to plan as established by provider. . Discussed plans with patient for ongoing care management follow up and provided patient with direct contact information for care management team . Provided patient with printed educational materials related to Self health management of constipation   Patient Self Care  Activities:  . Self administers medications as prescribed . Attends all scheduled provider appointments . Calls pharmacy for medication refills . Performs ADL's independently . Performs IADL's independently . Calls provider office for new concerns or questions  Initial goal documentation     . To have less fatigue       Current Barriers:  Marland Kitchen Knowledge Deficits related to evaluation and treatment of fatigue . Chronic Disease Management support and education needs related to HTN, DMII, CKDIII, Fatigue  Nurse Case Manager Clinical Goal(s):  Marland Kitchen Over the next 90 days, patient will work with the [facility] CM and PCP to address needs related to diagnosis and treatment of fatigue  Interventions:  . Evaluation of current treatment plan related to fatigue and patient's adherence to plan as established by provider. . Discussed plans with patient for ongoing care management follow up and provided patient with direct contact information for care management team . Provided patient with printed educational materials related to Fatigue  Patient Self Care Activities:  . Self administers medications as prescribed . Attends all scheduled provider appointments . Calls pharmacy for medication refills . Performs ADL's independently . Performs IADL's independently . Calls provider office for new concerns or questions  Initial goal documentation         Telephone follow up appointment with care management team member scheduled for: 12/10/19   Barb Merino, RN, BSN, CCM Care Management Coordinator [facility]  Direct Phone: [phone]

## 2019-11-05 NOTE — Patient Instructions (Addendum)
Visit Information  Goals Addressed      Patient Stated   . COMPLETED: "I have a cough and sore throat" (pt-stated)       Current Barriers:  Marland Kitchen Knowledge Deficits related to diagnosis and treatment for cough and sore throat  Nurse Case Manager Clinical Goal(s):  Marland Kitchen Over the next 14 days, patient will verbalize understanding of plan for COVID testing and following up.   Goal Met  CCM RN CM Interventions:  11/04/19 call completed with patient   . Evaluation of current treatment plan related to cough and sore throat and patient's adherence to plan as established by provider . Determined patient currently does not have symptoms suggestive of infection; discussed she requested a COVID test for "a peace of mind" . Determined patient is limiting her activities outside her home and is using COVID precautions  Patient Self Care Activities:  . Self administers medications as prescribed . Attends all scheduled provider appointments . Calls pharmacy for medication refills . Attends church or other social activities . Performs ADL's independently . Performs IADL's independently . Calls provider office for new concerns or questions  Please see past updates related to this goal by clicking on the "Past Updates" button in the selected goal      . 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  . Chronic Disease Management support and education needs related to HTN, DMII, CKDIII, dental decay   Nurse Case Manager Clinical Goal(s):  Marland Kitchen Over the next 90 days, patient will work with the CCM team to address needs related to dental care and resources to help with cost of dental care not covered by health plan   Interventions:  . Evaluation of current treatment plan related to new referral for dentistry consultation and patient's  adherence to plan as established by provider. . Advised patient to contact the Dr. Iantha Fallen to schedule her new patient appointment for evaluation of dental care needed; provided patient with the contact information and physical address . Collaborated with embedded BSW [name] regarding patient's request to receive any/all resources available that may help cover the cost for dental care not covered by her health plan  . Discussed plans with patient for ongoing care management follow up and provided patient with direct contact information for care management team  Patient Self Care Activities:  . Patient verbalizes understanding of plan to contact Dr. Iantha Fallen to schedule a new patient dental appointment  . Self administers medications as prescribed . Attends all scheduled provider appointments . Calls pharmacy for medication refills . Performs ADL's independently . Performs IADL's independently . Calls provider office for new concerns or questions  Initial goal documentation     . COMPLETED: "I want to get my sugar under control" (pt-stated)       Current Barriers:  Marland Kitchen Knowledge Deficits related to DM disease management. Mrs. Farabee states she doesn't like having to check her blood sugar or take medication for diabetes so she wants to work hard to get her blood sugar under control. We discussed the value of good glucose management whether she needs to stay on medication long term. She is very eager to learn about self health management strategies related to DM.   Nurse Case Manager Clinical Goal(s):  Marland Kitchen Over the next 90 days, patient  will verbalize understanding of plan for self health management strategies related to diabetes.  03/31/19 re-establish goal date due to treatment delays from COVID-19 Goal Met . Over the next 14 days, patient will activate nutrition strategies to promote improved cbg control: manage portion sizes utilizing plate method, journal cbg response  to meals, increase vegetable intake  03/31/19 Goal Met . Over the next 14 days, patient will maintain or improve daily exercise/walking regimen  03/31/19 Goal Met  CCM RN CM Interventions:  11/04/19 spoke with patient   . Determined patient is adhering to her DM treatment plan and maintaining her A1C around 6.5 last checked on 10/02/19 . Discussed patient continues to follow a diabetic friendly diet, she is taking her medications as prescribed and is working with the embedded Pharm D to obtain financial assistance for [employer] . Reinforced importance of ongoing Self monitoring of CBG's daily before meals; reiterated target daily glycemic control of 80-130 . Mailed printed DM patient materials related to "Carb Counting"; "Carb Choices"; "Managing Diabetes Meal Planning"; "Manage Your Diabetes Zone Safety Tool"  Patient Self Care Activities:  . Currently UNABLE TO independently manage DM . Self administers medications as prescribed . Attends all scheduled provider appointments . Calls pharmacy for medication refills . Attends church or other social activities . Performs ADL's independently . Performs IADL's independently . Calls provider office for new concerns or questions  Please see past updates related to this goal by clicking on the "Past Updates" button in the selected goal      . "I would like to better manage my medications" (pt-stated)       Current Barriers:  . 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 Evaluation of current treatment plan related to Medication Management and patient's adherence to plan as established by provider. . Provided education to patient re: pill packaging for easier polypharmacy management  . Collaborated with embedded Pharm D  Lottie Dawson regarding collaboration needed to discuss pill packaging system with patient and spouse . Discussed plans with patient for ongoing care management follow up and provided patient with direct contact information for care management team  Patient Self Care Activities:  . Patient verbalizes understanding of plan to collaborate with embedded Pharm D Lottie Dawson regarding best options for optimal medication management  . Self administers medications as prescribed . Attends all scheduled provider appointments . Calls pharmacy for medication refills . Performs ADL's independently . Performs IADL's independently . Calls provider office for new concerns or questions   Initial goal documentation     . I would like financial assistance with my Trulicity (pt-stated)   On track    Current Barriers:  . Financial Barriers-patient cannot afford Trulicity through insurance.    Pharmacist Clinical Goal(s):  Marland Kitchen Over the next 30 days, patient will work with CCM Pharmacist to address needs related to applying for financial assistance (Trulicity)  -->RE-ESTABLISHED ON 10/15/2019  Interventions: . Comprehensive medication review performed. . Advised patient to continue taking medications as prescribed, checking BGs & eating a DM healthy diet. . Discussed plans with patient for ongoing care management follow up and provided patient with direct contact information for care management team  . Patient verbalizes understanding and would like to proceed with patient assistance application (Trulicity) FOR [id number]. Patient needs  to mail in her proof of income.  Will mail patient application.  Will send provider portion. . Counseled on injection technique.  Patient reports compliance and denies adverse events.   Patient Self Care Activities:  . Self administers medications as prescribed . Attends all scheduled provider appointments . Calls pharmacy for medication refills  Please see past updates related  to this goal by clicking on the "Past Updates" button in the selected goal      . COMPLETED: My blood pressure is not as controlled as it used to be. (pt-stated)       Current Barriers:  . Uncontrolled hypertension, complicated by E9HB, HLD . Current antihypertensive regimen: atenolol/chlorthalidone 50-57m  . Previous antihypertensives tried: was also on amlodipine/benazepril 5-156mdaily  (Patient did not pick up refills,  She stated she would restart this medication.  This will likely bring her blood pressure back to goal <130/80) . Current home BP readings: 150/90s, 145/85  Pharmacist Clinical Goal(s):  . Marland Kitchenver the next 90 days, patient will work with PharmD and providers to optimize antihypertensive regimen  CCM RN CM Interventions: 11/04/19 call completed with patient . Assessed for medication adherence; determined patient is adhering to taking her medications as prescribed without missed doses . Reinforced the importance of adherence to following a low sodium diet, avoid fried foods . Discussed patient is satisfied with her BP control; discussed patient is at goal with recent BP noted on 11/03/19 BP of 124/68 . Discussed target BP of 130/80; patient is self monitoring and verbalizes understanding of when to call the doctor with BP concerns . Mailed printed patient educational materials related to "What is High Blood Pressure"; "African Americans and High Blood Pressure"; "Why Should I Restrict Sodium?"; "Life's Simple 7"  Patient Self Care Activities:  . Patient will continue to check BP daily , document, and provide at future appointments . Patient will focus on medication adherence by picking up refills of all anti-hypertensive medications.  Please see past updates related to this goal by clicking on the "Past Updates" button in the selected goal        Other   . To better manage my constipation       Current Barriers:  . Marland Kitchennowledge Deficits related to treatment management for  constipation . Chronic Disease Management support and education needs related to HTN, DMII, CKDIII, constipation   Nurse Case Manager Clinical Goal(s):  . Marland Kitchenver the next 90 days, patient will work with the CCRavenaM and PCP to address needs related to impaired bowel elimination/constipation  . Over the next 90 days, patient will experience decrease in ED visits. ED visits in last 6 months = 1 ED visit on 10/30/19; dx: acute diverticulitis  Interventions:  . Evaluation of current treatment plan related to constipation and patient's adherence to plan as established by provider. . Discussed plans with patient for ongoing care management follow up and provided patient with direct contact information for care management team . Provided patient with printed educational materials related to Self health management of constipation   Patient Self Care Activities:  . Self administers medications as prescribed . Attends all scheduled provider appointments . Calls pharmacy for medication refills . Performs ADL's independently . Performs IADL's independently . Calls provider office for new concerns or questions  Initial goal documentation     . To have less fatigue       Current Barriers:  . Marland Kitchennowledge Deficits related to evaluation and treatment of fatigue . Chronic Disease  Management support and education needs related to HTN, DMII, CKDIII, Fatigue  Nurse Case Manager Clinical Goal(s):  Marland Kitchen Over the next 90 days, patient will work with the [facility] CM and PCP to address needs related to diagnosis and treatment of fatigue  Interventions:  . Evaluation of current treatment plan related to fatigue and patient's adherence to plan as established by provider. . Discussed plans with patient for ongoing care management follow up and provided patient with direct contact information for care management team . Provided patient with printed educational materials related to Fatigue  Patient Self Care  Activities:  . Self administers medications as prescribed . Attends all scheduled provider appointments . Calls pharmacy for medication refills . Performs ADL's independently . Performs IADL's independently . Calls provider office for new concerns or questions  Initial goal documentation       The patient verbalized understanding of instructions provided today and declined a print copy of patient instruction materials.   Telephone follow up appointment with care management team member scheduled for: 12/10/19  Barb Merino, RN, BSN, CCM Care Management Coordinator [facility]  Direct Phone: [phone]

## 2019-11-06 NOTE — Patient Instructions (Signed)
Visit Information  Goals Addressed            This Visit's Progress     Patient Stated   . I would like to keep my blood sugar at goal & maintain my A1c (pt-stated)       Pharmacist Clinical Goal(s):  Marland Kitchen Over the next 90 days, patient with work with PharmD and primary care provider to address disease state management of diabetes  Current Barriers:  . Diabetes: T2DM; A1c 6.5% on 10/02/19 (was 6.4% on 03/19/19-->6.3% on 06/19/19)  Encouraged patient to increase water intake and try to get a few more steps in  . Current antihyperglycemic regimen: Trulicity 0.75mg  once weekly (patient stable on this dose) One Touch Verio glucometer/supplies . Denies hypoglycemic symptoms, including dizziness, lightheadedness, shaking, sweating . Denies hyperglycemic symptoms, including polyuria, polydipsia, polyphagia, nocturia, blurred vision, neuropathy . Current exercise: walking . Current blood glucose readings: FBG 100-120, reports FBG<130; notes that BG is higher when patient eats desserts,etc . Cardiovascular risk reduction: o Current hypertensive regimen: amlodipine 5 /benazepril 10, Atenolol 50/chlorthalidone 25 (patient states her BP used to be better controlled when she was on combination therapy.  Upon reviewed of pharmacy claims, patient remains on 2 combination medications for BP listed above.  Will remind patient that she remains on these two combination medications for BP (total of 4 medications targeted for BP).  Last BP in office was 130/74.  Patient encouraged to continue taking BP at home and report back. o Current hyperlipidemia regimen: rosuvastatin 20 mg daily (getting 90 DS) o Patient continues to take daily ASA  Interventions: Clinic visit with patient on 11/03/2019 . Comprehensive medication review performed . Patient inquiring about multivitamin for immune support.  Recommended patient take a complete multivitamin (in addition to all basic vitamins-->one containing zinc, vitD are  helpful for immune support/daily health).  Patient is also taking elderberry gummies to aid in immune support. . Reviewed ADA recommended "diabetes-friendly" diet  (reviewed healthy snack/food options) . 11/03/2019-->handouts provided on DM health diet, carbohydrate counting and cooking with diabetes   Patient Self Care Activities:  . Patient will check blood glucose daily (AM) , document, and provide at future appointments . Patient will focus on medication adherence by 100% medication adherence, improved BP control, blood glucose at goal range . Patient will take medications as prescribed . Patient will contact provider with any episodes of hypoglycemia . Patient will report any questions or concerns to provider    Please see past updates related to this goal by clicking on the "Past Updates" button in the selected goal         The patient verbalized understanding of instructions provided today and declined a print copy of patient instruction materials.   The care management team will reach out to the patient again over the next 60 days.   SIGNATURE Regina Eck, PharmD, BCPS Clinical Pharmacist, [facility] [place]: [phone]

## 2019-11-07 ENCOUNTER — Ambulatory Visit: Payer: Self-pay

## 2019-11-07 ENCOUNTER — Telehealth: Payer: Self-pay

## 2019-11-07 DIAGNOSIS — N183 Chronic kidney disease, stage 3 unspecified: Secondary | ICD-10-CM

## 2019-11-07 DIAGNOSIS — E1122 Type 2 diabetes mellitus with diabetic chronic kidney disease: Secondary | ICD-10-CM

## 2019-11-07 NOTE — Chronic Care Management (AMB) (Signed)
  Chronic Care Management   Outreach Note  11/07/2019 Name: [name] MRN: [id number] DOB: [birth year]  Referred by: [name], MD Reason for referral : Care Coordination   SW placed an unsuccessful outbound call to the patient to assist with care coordination and review dental resources. SW left a HIPAA compliant voice message requesting a return call.  Follow Up Plan: SW will attempt to outreach the patient over the next 10 days.  [name], BSW, CDP Social Worker, Certified Dementia Practitioner [place] [facility] [place] [facility] [facility] [phone]

## 2019-11-12 ENCOUNTER — Other Ambulatory Visit: Payer: Self-pay | Admitting: Pharmacy Technician

## 2019-11-12 NOTE — Patient Outreach (Signed)
[facility]) Care Management  11/12/2019  Kymia Pea 05-18-1941 [id number]   Received approval confirmation sheet from [place].  Patient has been approved for Trulicity thru [employer] until 10/29/2020.  Patient assistance has been completed, will remove myself from care team.  [name]. [name] [place] Certified Pharmacy Technician [place] [facility] Direct [phone]

## 2019-11-13 ENCOUNTER — Ambulatory Visit: Payer: Self-pay

## 2019-11-13 ENCOUNTER — Telehealth: Payer: Self-pay

## 2019-11-13 DIAGNOSIS — I10 Essential (primary) hypertension: Secondary | ICD-10-CM

## 2019-11-13 DIAGNOSIS — N183 Chronic kidney disease, stage 3 unspecified: Secondary | ICD-10-CM

## 2019-11-13 DIAGNOSIS — E1122 Type 2 diabetes mellitus with diabetic chronic kidney disease: Secondary | ICD-10-CM

## 2019-11-13 NOTE — Chronic Care Management (AMB) (Signed)
  Chronic Care Management   Outreach Note  11/13/2019 Name: Terasha Zipay MRN: [id number] DOB: [birth year]  Referred by: [name], MD Reason for referral : Care Coordination   SW placed a second unsuccessful outbound call to the patient to assist with care coordination needs. SW left a HIPAA compliant voice message requesting a return call.  Follow Up Plan: SW will attempt a third and final outreach over the next two weeks.  [name], BSW, CDP Social Worker, Certified Dementia Practitioner [place] [facility] [phone]

## 2019-11-19 ENCOUNTER — Ambulatory Visit: Payer: Self-pay

## 2019-11-19 DIAGNOSIS — N183 Chronic kidney disease, stage 3 unspecified: Secondary | ICD-10-CM

## 2019-11-19 DIAGNOSIS — E1122 Type 2 diabetes mellitus with diabetic chronic kidney disease: Secondary | ICD-10-CM

## 2019-11-19 NOTE — Chronic Care Management (AMB) (Signed)
Chronic Care Management    Social Work Follow Up Note  11/19/2019 Name: [name] MRN: [id number] DOB: 28-Mar-1941  [name] is a 79 y.o. year old female who is a primary care patient of [name], MD. The CCM team was consulted for assistance with care coordination.   Review of patient status, including review of consultants reports, other relevant assessments, and collaboration with appropriate care team members and the patient's provider was performed as part of comprehensive patient evaluation and provision of chronic care management services.    SW placed a successful outbound call to the patient to assist with care coordination.  Outpatient Encounter Medications as of 11/19/2019  Medication Sig  . allopurinol (ZYLOPRIM) 300 MG tablet Take 1 tablet (300 mg total) by mouth daily. (Patient not taking: Reported on 11/03/2019)  . amLODipine-benazepril (LOTREL) 5-10 MG capsule TAKE 1 CAPSULE BY MOUTH EVERY DAY  . aspirin EC 81 MG tablet Take 1 tablet (81 mg total) by mouth daily.  Marland Kitchen atenolol-chlorthalidone (TENORETIC) 50-25 MG tablet TAKE 1 TABLET BY MOUTH EVERY DAY AT 11AM  . Diclofenac Sodium (PENNSAID) 2 % SOLN Apply 10 drops topically 2 (two) times daily as needed.  . Dulaglutide (TRULICITY) A999333 0000000 SOPN INJECT 0.5 ML(0.75 MG) SUBCUTANEOUSLY EVERY WEEK IN ABDOMEN, THIGH OR UPPER ARM ROTATING INJECTION SITE  . Multiple Vitamins-Minerals (ZINC PO) Take by mouth.  . NON FORMULARY Blood balance formula Ordered from Dr. Irena Cords 1 capsule daily.  [name] DELICA LANCETS 99991111 MISC Use to check blood glucose once daily  . ONETOUCH VERIO test strip USE AS DIRECTED TO TEST BLOOD SUGAR BID  . OVER THE COUNTER MEDICATION OTC immune system booster daily  . pantoprazole (PROTONIX) 40 MG tablet Take 1 tablet (40 mg total) by mouth daily.  . Probiotic Product (PROBIOTIC DAILY PO) Take 3 capsules by mouth daily.  . rosuvastatin (CRESTOR) 20 MG tablet Take 1 tablet (20 mg total) by mouth  daily.   No facility-administered encounter medications on file as of 11/19/2019.     Goals Addressed            This Visit's Progress     Patient Stated   . 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  . Chronic Disease Management support and education needs related to HTN, DMII, CKDIII, dental decay   Nurse Case Manager Clinical Goal(s):  Marland Kitchen Over the next 90 days, patient will work with the CCM team to address needs related to dental care and resources to help with cost of dental care not covered by health plan   CCM SW Interventions:  Completed 11/19/19 . Successful outbound call placed to the patient to gather information and assist with care coordination o Patient has dental coverage under [facility] health plan but reports current plan only covers the cost of basic dental services o Reviewed past interventions including opportunity to upgrade dental insurance during open enrollment, which has now past. Patient reports she did explore this option but felt it was not worth it to pay extra when the coverage would only assist with $1500 worth of major work when the patient most likely needs closer to $4,000 worth of dental work o Discussed opportunity to apply for care credit if the patients dental provider accepted this form of payment and if the patient would  be interested in opening a care credit card . Advised the patient that unfortunately, no resources can assist with co-pay amounts . Discussed barriers from [place] impacting dental clinics across the state . Collaboration with care team to advise of inability to locate resource to assist the patient  Patient Self Care Activities:  . Patient verbalizes understanding of plan to contact Dr. Iantha Fallen to schedule a new patient dental appointment  .  Self administers medications as prescribed . Attends all scheduled provider appointments . Calls pharmacy for medication refills . Performs ADL's independently . Performs IADL's independently . Calls provider office for new concerns or questions  Please see past updates related to this goal by clicking on the "Past Updates" button in the selected goal          Follow Up Plan: No planned SW follow up at this time. The patient will remain active with CM Case Manager.   [name], BSW, CDP Social Worker, Certified Dementia Practitioner [place] [facility] [place] [facility] [phone]  Total time spent performing care coordination and/or care management activities with the patient by phone or face to face = 10 minutes.

## 2019-11-19 NOTE — Patient Instructions (Signed)
Social Worker Visit Information  Goals we discussed today:  Goals Addressed            This Visit's Progress     Patient Stated   . 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  . Chronic Disease Management support and education needs related to HTN, DMII, CKDIII, dental decay   Nurse Case Manager Clinical Goal(s):  Marland Kitchen Over the next 90 days, patient will work with the CCM team to address needs related to dental care and resources to help with cost of dental care not covered by health plan   CCM SW Interventions:  Completed 11/19/19 . Successful outbound call placed to the patient to gather information and assist with care coordination o Patient has dental coverage under [facility] health plan but reports current plan only covers the cost of basic dental services o Reviewed past interventions including opportunity to upgrade dental insurance during open enrollment, which has now past. Patient reports she did explore this option but felt it was not worth it to pay extra when the coverage would only assist with $1500 worth of major work when the patient most likely needs closer to $4,000 worth of dental work o Discussed opportunity to apply for care credit if the patients dental provider accepted this form of payment and if the patient would be interested in opening a care credit card . Advised the patient that unfortunately, no resources can assist with co-pay amounts . Discussed barriers from [place] impacting dental clinics across the state . Collaboration with care team to advise of inability to locate resource to assist the patient  Patient Self Care Activities:  . Patient verbalizes understanding of plan to contact Dr. Iantha Fallen to schedule a new patient dental appointment  . Self administers medications  as prescribed . Attends all scheduled provider appointments . Calls pharmacy for medication refills . Performs ADL's independently . Performs IADL's independently . Calls provider office for new concerns or questions  Please see past updates related to this goal by clicking on the "Past Updates" button in the selected goal          Follow Up Plan: No planned SW follow up at this time. The patient will remain active with RN Case Manager.   [name], BSW, CDP Social Worker, Certified Dementia Practitioner [place] [phone]

## 2019-11-26 ENCOUNTER — Ambulatory Visit: Payer: Self-pay | Admitting: Pharmacist

## 2019-11-26 DIAGNOSIS — N183 Chronic kidney disease, stage 3 unspecified: Secondary | ICD-10-CM

## 2019-11-26 DIAGNOSIS — E1122 Type 2 diabetes mellitus with diabetic chronic kidney disease: Secondary | ICD-10-CM

## 2019-11-26 DIAGNOSIS — I10 Essential (primary) hypertension: Secondary | ICD-10-CM | POA: Diagnosis not present

## 2019-11-26 NOTE — Patient Instructions (Signed)
Visit Information  Goals Addressed            This Visit's Progress     Patient Stated   . COMPLETED: I would like financial assistance with my Trulicity (pt-stated)       Current Barriers:  . Financial Barriers-patient cannot afford Trulicity through insurance.    Pharmacist Clinical Goal(s):  Marland Kitchen Over the next 30 days, patient will work with CCM Pharmacist to address needs related to applying for financial assistance (Trulicity)  -->RE-ESTABLISHED ON 10/15/2019  Interventions: . Comprehensive medication review performed. . Advised patient to continue taking medications as prescribed, checking BGs & eating a DM healthy diet. . Discussed plans with patient for ongoing care management follow up and provided patient with direct contact information for care management team  . Patient verbalizes understanding and would like to proceed with patient assistance application (Trulicity) FOR [id number]. Patient needs to mail in her proof of income.  Will mail patient application.  Will send provider portion. . Counseled on injection technique.  Patient reports compliance and denies adverse events. . Patient approved for 2021. Instructed patient to call [phone] to set up shipment   Patient Self Care Activities:  . Self administers medications as prescribed . Attends all scheduled provider appointments . Calls pharmacy for medication refills  Please see past updates related to this goal by clicking on the "Past Updates" button in the selected goal      . I would like to keep my blood sugar at goal & maintain my A1c (pt-stated)       Pharmacist Clinical Goal(s):  Marland Kitchen Over the next 90 days, patient with work with PharmD and primary care provider to address disease state management of diabetes  Current Barriers:  . Diabetes: T2DM; A1c 6.5% on 10/02/19 (was 6.4% on 03/19/19-->6.3% on 06/19/19)  Encouraged patient to increase water intake and try to get a few more steps in  . Current  antihyperglycemic regimen: Trulicity 0.75mg  once weekly (patient stable on this dose) One Touch Verio glucometer/supplies . Denies hypoglycemic symptoms, including dizziness, lightheadedness, shaking, sweating . Denies hyperglycemic symptoms, including polyuria, polydipsia, polyphagia, nocturia, blurred vision, neuropathy . Current exercise: walking . Current blood glucose readings: FBG 100-120, reports FBG<130; notes that BG is higher when patient eats desserts,etc . Cardiovascular risk reduction: o Current hypertensive regimen: amlodipine 5 /benazepril 10, Atenolol 50/chlorthalidone 25 (patient states her BP used to be better controlled when she was on combination therapy.  Upon reviewed of pharmacy claims, patient remains on 2 combination medications for BP listed above.  Will remind patient that she remains on these two combination medications for BP (total of 4 medications targeted for BP).  Last BP in office was 130/74.  Patient encouraged to continue taking BP at home and report back. o Current hyperlipidemia regimen: rosuvastatin 20 mg daily (getting 90 DS)  LDL 93 on 8/20 o Patient continues to take daily ASA o Patient interested in pill packs, will set up at a later time  Interventions: Clinic visit with patient on 11/03/2019 . Comprehensive medication review performed . Patient inquiring about multivitamin for immune support.  Recommended patient take a complete multivitamin (in addition to all basic vitamins-->one containing zinc, vitD are helpful for immune support/daily health).  Patient is also taking elderberry gummies to aid in immune support. . Reviewed ADA recommended "diabetes-friendly" diet  (reviewed healthy snack/food options) . 11/03/2019-->handouts provided on DM health diet, carbohydrate counting and cooking with diabetes   Patient Self Care Activities:  .  Patient will check blood glucose daily (AM) , document, and provide at future appointments . Patient will focus on  medication adherence by 100% medication adherence, improved BP control, blood glucose at goal range . Patient will take medications as prescribed . Patient will contact provider with any episodes of hypoglycemia . Patient will report any questions or concerns to provider    Please see past updates related to this goal by clicking on the "Past Updates" button in the selected goal         The patient verbalized understanding of instructions provided today and declined a print copy of patient instruction materials.   The care management team will reach out to the patient again over the next 30 days.   SIGNATURE Regina Eck, PharmD, BCPS Clinical Pharmacist, [facility] [place]: [phone]

## 2019-11-26 NOTE — Progress Notes (Signed)
Chronic Care Management    Visit Note  11/26/2019 Name: [name] MRN: [id number] DOB: [birth year]  Referred by: [name], MD Reason for referral : Chronic Care Management (Diabetes)   [name] is a 79 y.o. year old female who is a primary care patient of [name], MD. The CCM team was consulted for assistance with chronic disease management and care coordination needs related to DMII  Review of patient status, including review of consultants reports, relevant laboratory and other test results, and collaboration with appropriate care team members and the patient's provider was performed as part of comprehensive patient evaluation and provision of chronic care management services.      Medications: Outpatient Encounter Medications as of 11/26/2019  Medication Sig  . allopurinol (ZYLOPRIM) 300 MG tablet Take 1 tablet (300 mg total) by mouth daily. (Patient not taking: Reported on 11/03/2019)  . amLODipine-benazepril (LOTREL) 5-10 MG capsule TAKE 1 CAPSULE BY MOUTH EVERY DAY  . aspirin EC 81 MG tablet Take 1 tablet (81 mg total) by mouth daily.  Marland Kitchen atenolol-chlorthalidone (TENORETIC) 50-25 MG tablet TAKE 1 TABLET BY MOUTH EVERY DAY AT 11AM  . Diclofenac Sodium (PENNSAID) 2 % SOLN Apply 10 drops topically 2 (two) times daily as needed.  . Dulaglutide (TRULICITY) A999333 0000000 SOPN INJECT 0.5 ML(0.75 MG) SUBCUTANEOUSLY EVERY WEEK IN ABDOMEN, THIGH OR UPPER ARM ROTATING INJECTION SITE  . Multiple Vitamins-Minerals (ZINC PO) Take by mouth.  . NON FORMULARY Blood balance formula Ordered from Dr. Irena Cords 1 capsule daily.  [name] DELICA LANCETS 99991111 MISC Use to check blood glucose once daily  . ONETOUCH VERIO test strip USE AS DIRECTED TO TEST BLOOD SUGAR BID  . OVER THE COUNTER MEDICATION OTC immune system booster daily  . pantoprazole (PROTONIX) 40 MG tablet Take 1 tablet (40 mg total) by mouth daily.  . Probiotic Product (PROBIOTIC DAILY PO) Take 3 capsules by mouth daily.  .  rosuvastatin (CRESTOR) 20 MG tablet Take 1 tablet (20 mg total) by mouth daily.   No facility-administered encounter medications on file as of 11/26/2019.     Objective:   Goals Addressed            This Visit's Progress     Patient Stated   . COMPLETED: I would like financial assistance with my Trulicity (pt-stated)       Current Barriers:  . Financial Barriers-patient cannot afford Trulicity through insurance.    Pharmacist Clinical Goal(s):  Marland Kitchen Over the next 30 days, patient will work with CCM Pharmacist to address needs related to applying for financial assistance (Trulicity)  -->RE-ESTABLISHED ON 10/15/2019  Interventions: . Comprehensive medication review performed. . Advised patient to continue taking medications as prescribed, checking BGs & eating a DM healthy diet. . Discussed plans with patient for ongoing care management follow up and provided patient with direct contact information for care management team  . Patient verbalizes understanding and would like to proceed with patient assistance application (Trulicity) FOR [id number]. Patient needs to mail in her proof of income.  Will mail patient application.  Will send provider portion. . Counseled on injection technique.  Patient reports compliance and denies adverse events. . Patient approved for 2021. Instructed patient to call [phone] to set up shipment   Patient Self Care Activities:  . Self administers medications as prescribed . Attends all scheduled provider appointments . Calls pharmacy for medication refills  Please see past updates related to this goal by clicking on the "Past Updates" button  in the selected goal      . I would like to keep my blood sugar at goal & maintain my A1c (pt-stated)       Pharmacist Clinical Goal(s):  Marland Kitchen Over the next 90 days, patient with work with PharmD and primary care provider to address disease state management of diabetes  Current Barriers:  . Diabetes: T2DM; A1c 6.5%  on 10/02/19 (was 6.4% on 03/19/19-->6.3% on 06/19/19)  Encouraged patient to increase water intake and try to get a few more steps in  . Current antihyperglycemic regimen: Trulicity 0.75mg  once weekly (patient stable on this dose) One Touch Verio glucometer/supplies . Denies hypoglycemic symptoms, including dizziness, lightheadedness, shaking, sweating . Denies hyperglycemic symptoms, including polyuria, polydipsia, polyphagia, nocturia, blurred vision, neuropathy . Current exercise: walking . Current blood glucose readings: FBG 100-120, reports FBG<130; notes that BG is higher when patient eats desserts,etc . Cardiovascular risk reduction: o Current hypertensive regimen: amlodipine 5 /benazepril 10, Atenolol 50/chlorthalidone 25 (patient states her BP used to be better controlled when she was on combination therapy.  Upon reviewed of pharmacy claims, patient remains on 2 combination medications for BP listed above.  Will remind patient that she remains on these two combination medications for BP (total of 4 medications targeted for BP).  Last BP in office was 130/74.  Patient encouraged to continue taking BP at home and report back. o Current hyperlipidemia regimen: rosuvastatin 20 mg daily (getting 90 DS)  LDL 93 on 8/20 o Patient continues to take daily ASA o Patient interested in pill packs, will set up at a later time  Interventions: Clinic visit with patient on 11/03/2019 . Comprehensive medication review performed . Patient inquiring about multivitamin for immune support.  Recommended patient take a complete multivitamin (in addition to all basic vitamins-->one containing zinc, vitD are helpful for immune support/daily health).  Patient is also taking elderberry gummies to aid in immune support. . Reviewed ADA recommended "diabetes-friendly" diet  (reviewed healthy snack/food options) . 11/03/2019-->handouts provided on DM health diet, carbohydrate counting and cooking with diabetes   Patient  Self Care Activities:  . Patient will check blood glucose daily (AM) , document, and provide at future appointments . Patient will focus on medication adherence by 100% medication adherence, improved BP control, blood glucose at goal range . Patient will take medications as prescribed . Patient will contact provider with any episodes of hypoglycemia . Patient will report any questions or concerns to provider    Please see past updates related to this goal by clicking on the "Past Updates" button in the selected goal         Plan:   The care management team will reach out to the patient again over the next 30 days.   Provider Signature Regina Eck, PharmD, BCPS Clinical Pharmacist, [facility] [place]: [phone]

## 2019-12-10 ENCOUNTER — Ambulatory Visit: Payer: Self-pay

## 2019-12-10 ENCOUNTER — Telehealth: Payer: Self-pay

## 2019-12-10 DIAGNOSIS — I10 Essential (primary) hypertension: Secondary | ICD-10-CM

## 2019-12-10 DIAGNOSIS — E1122 Type 2 diabetes mellitus with diabetic chronic kidney disease: Secondary | ICD-10-CM

## 2019-12-10 DIAGNOSIS — N183 Chronic kidney disease, stage 3 unspecified: Secondary | ICD-10-CM

## 2019-12-11 NOTE — Chronic Care Management (AMB) (Signed)
  Chronic Care Management   Outreach Note  12/11/2019 Name: [name] MRN: [id number] DOB: 05-17-1941  Referred by: [name], MD Reason for referral : Chronic Care Management (CCM RNCM Telephone Follow up)   An unsuccessful telephone outreach was attempted today. The patient was referred to the case management team for assistance with care management and care coordination.   Follow Up Plan: Telephone follow up appointment with care management team member scheduled for: 01/20/20  Barb Merino, RN, BSN, CCM Care Management Coordinator [facility]  Direct Phone: [phone]

## 2019-12-17 ENCOUNTER — Ambulatory Visit: Payer: Self-pay | Admitting: Pharmacist

## 2019-12-17 DIAGNOSIS — E1122 Type 2 diabetes mellitus with diabetic chronic kidney disease: Secondary | ICD-10-CM

## 2019-12-17 DIAGNOSIS — N183 Chronic kidney disease, stage 3 unspecified: Secondary | ICD-10-CM

## 2019-12-22 NOTE — Progress Notes (Signed)
  Chronic Care Management   Outreach Note  12/17/2019 Name: Gaynel Janas MRN: [id number] DOB: [birth year]  Referred by: [name], MD Reason for referral : Chronic Care Management   An unsuccessful telephone outreach was attempted today. The patient was referred to the case management team for assistance with care management and care coordination.   Follow Up Plan: A HIPPA compliant phone message was left for the patient providing contact information and requesting a return call.  The care management team will reach out to the patient again over the next 30 days.   SIGNATURE Regina Eck, PharmD, BCPS Clinical Pharmacist, [facility] [place]: [phone]

## 2019-12-26 ENCOUNTER — Other Ambulatory Visit: Payer: Self-pay | Admitting: Internal Medicine

## 2019-12-31 ENCOUNTER — Ambulatory Visit: Payer: Medicare Other | Admitting: Internal Medicine

## 2019-12-31 ENCOUNTER — Ambulatory Visit (INDEPENDENT_AMBULATORY_CARE_PROVIDER_SITE_OTHER): Payer: Medicare Other

## 2019-12-31 ENCOUNTER — Other Ambulatory Visit: Payer: Self-pay

## 2019-12-31 VITALS — BP 148/78 | HR 67 | Temp 98.0°F | Ht 69.0 in | Wt 248.0 lb

## 2019-12-31 DIAGNOSIS — Z0000 Encounter for general adult medical examination without abnormal findings: Secondary | ICD-10-CM | POA: Diagnosis not present

## 2019-12-31 NOTE — Progress Notes (Signed)
This visit occurred during the SARS-CoV-2 public health emergency.  Safety protocols were in place, including screening questions prior to the visit, additional usage of staff PPE, and extensive cleaning of exam room while observing appropriate contact time as indicated for disinfecting solutions.  Subjective:   [name] is a 79 y.o. female who presents for Medicare Annual (Subsequent) preventive examination.  Review of Systems:  n/a Cardiac Risk Factors include: advanced age (>67men, >16 women);diabetes mellitus;hypertension;obesity (BMI >30kg/m2);sedentary lifestyle     Objective:     Vitals: BP (!) 148/78 (BP Location: Right Arm, Patient Position: Sitting, Cuff Size: Large)   Pulse 67   Temp 98 F (36.7 C) (Oral)   Ht 5\' 9"  (1.753 m)   Wt 248 lb (112.5 kg)   SpO2 96%   BMI 36.62 kg/m   Body mass index is 36.62 kg/m.  Advanced Directives 12/31/2019 10/30/2019 03/19/2019 02/05/2019 08/31/2018 07/12/2017 01/17/2016  Does Patient Have a Medical Advance Directive? No No No No No No No  Would patient like information on creating a medical advance directive? Yes (MAU/Ambulatory/Procedural Areas - Information given) No - Patient declined Yes (MAU/Ambulatory/Procedural Areas - Information given) No - Patient declined No - Patient declined - Yes - Educational materials given  Pre-existing out of facility DNR order (yellow form or pink MOST form) - - - - - - -    Tobacco Social History   Tobacco Use  Smoking Status Never Smoker  Smokeless Tobacco Never Used     Counseling given: Not Answered   Clinical Intake:  Pre-visit preparation completed: Yes  Pain : 0-10 Pain Score: 8  Pain Type: Acute pain Pain Location: Groin Pain Orientation: Right Pain Radiating Towards: to back and buttocks Pain Descriptors / Indicators: Aching Pain Onset: 1 to 4 weeks ago Pain Frequency: Intermittent Pain Relieving Factors: keeping pressure off of it  Pain Relieving Factors: keeping pressure  off of it  Nutritional Status: BMI > 30  Obese Nutritional Risks: None Diabetes: Yes  How often do you need to have someone help you when you read instructions, pamphlets, or other written materials from your doctor or pharmacy?: 1 - Never What is the last grade level you completed in school?: college  Interpreter Needed?: No  Information entered by :: NAllen LPN  Past Medical History:  Diagnosis Date  . Abdominal pain   . Arthritis    cervical disc degeneration/ oa left knee, carpal tunnel rt wrist, adhesive capsulitis right shoulder, rt hand weakness; lumbar degeneration  . Carpal tunnel syndrome   . 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 Constipation   . Diabetes mellitus without complication ([place])    borderline - diet control  . Diverticulitis hx of  . Diverticulosis   . E. coli infection    2020  . Frequent UTI    hx of urethral injury during colon surgery - states frequent uti's since  . GERD (gastroesophageal reflux disease)   . H/O hiatal hernia   . History of palpitations    in the past  . History of shingles    has a lingering itching on back where shingles were  . Hyperlipidemia   . Hypertension   . Numbness and tingling in right hand    pt. states has numbness of right hand very frequently-watch positioning  . Obesity   .  Osteoarthritis   . Osteoporosis   . Pain    pain left knee and pain right hip and right groin  . Personal history of colonic polyps-adenoma 08/26/2008  . Pneumonia 2005  . Vitamin D deficiency    Past Surgical History:  Procedure Laterality Date  . ABDOMINAL HYSTERECTOMY    . bladder tack    . BREAST BIOPSY Left   . BREAST SURGERY     breast duct resection- benign  . CARPAL TUNNEL RELEASE Right 06/04/2014   Procedure: RIGHT CARPAL TUNNEL RELEASE;  Surgeon:  Roseanne Kaufman, MD;  Location: [facility];  Service: Orthopedics;  Laterality: Right;  . COLON RESECTION  2008   hx diverticulosis  . COLON SURGERY    . COLONOSCOPY    . colonoscopy [id number]    . JOINT REPLACEMENT    . OOPHORECTOMY    . POLYPECTOMY    . skin graft left arm - traumatic compression injury left upper arm    . temporary ureter stent    . TOTAL KNEE ARTHROPLASTY  01/05/2012   Procedure: TOTAL KNEE ARTHROPLASTY;  Surgeon: [name], MD;  Location: [facility];  Service: Orthopedics;  Laterality: Right;  . TOTAL KNEE ARTHROPLASTY Left 08/17/2014   Procedure: LEFT TOTAL KNEE ARTHROPLASTY;  Surgeon: [name], MD;  Location: [facility];  Service: Orthopedics;  Laterality: Left;  . ureter repair for tyransected left ureter     Family History  Problem Relation Age of Onset  . Breast cancer Mother 11  . Hypertension Mother   . Prostate cancer Father   . Hypertension Father   . Dementia Sister   . Lung cancer Brother   . Hypertension Brother   . COPD Brother   . Cancer Maternal Grandmother   . Arthritis Sister   . Hypertension Sister   . Arthritis Sister   . Hypertension Child   . Hypertension Child   . Hypertension Child   . Colon polyps Neg Hx   . Esophageal cancer Neg Hx   . Rectal cancer Neg Hx   . Stomach cancer Neg Hx   . Colon cancer Neg Hx    Social History   Socioeconomic History  . Marital status: Married    Spouse name: Not on file  . Number of children: 6  . Years of education: Not on file  . Highest education level: Not on file  Occupational History  . Occupation: Games developer: [employer]  Tobacco Use  . Smoking status: Never Smoker  . Smokeless tobacco: Never Used  Substance and Sexual Activity  . Alcohol use: No  . Drug use: No  . Sexual activity: Not Currently  Other Topics Concern  . Not on file  Social History Narrative   The patient is married and has 6 children   Operates a business   No alcohol  tobacco or drug use   Social Determinants of Radio broadcast assistant Strain: Low Risk   . Difficulty of Paying Living Expenses: Not hard at all  Food Insecurity: No Food Insecurity  . Worried About Charity fundraiser in the Last Year: Never true  . Ran Out of Food in the Last Year: Never true  Transportation Needs: No Transportation Needs  . Lack of Transportation (Medical): No  . Lack of Transportation (Non-Medical): No  Physical Activity: Inactive  . Days of Exercise per Week: 0 days  . Minutes of Exercise per Session: 0 min  Stress: No  Stress Concern Present  . Feeling of Stress : Not at all  Social Connections: Not Isolated  . Frequency of Communication with Friends and Family: More than three times a week  . Frequency of Social Gatherings with Friends and Family: More than three times a week  . Attends Religious Services: More than 4 times per year  . Active Member of Clubs or Organizations: Yes  . Attends Archivist Meetings: More than 4 times per year  . Marital Status: Married    Outpatient Encounter Medications as of 12/31/2019  Medication Sig  . allopurinol (ZYLOPRIM) 300 MG tablet TAKE 1 TABLET(300 MG) BY MOUTH DAILY  . amLODipine-benazepril (LOTREL) 5-10 MG capsule TAKE 1 CAPSULE BY MOUTH EVERY DAY  . aspirin EC 81 MG tablet Take 1 tablet (81 mg total) by mouth daily.  Marland Kitchen atenolol-chlorthalidone (TENORETIC) 50-25 MG tablet TAKE 1 TABLET BY MOUTH EVERY DAY AT 11 AM  . Dulaglutide (TRULICITY) A999333 0000000 SOPN INJECT 0.5 ML(0.75 MG) SUBCUTANEOUSLY EVERY WEEK IN ABDOMEN, THIGH OR UPPER ARM ROTATING INJECTION SITE  . ONETOUCH DELICA LANCETS 99991111 MISC Use to check blood glucose once daily  . ONETOUCH VERIO test strip USE AS DIRECTED TO TEST BLOOD SUGAR BID  . pantoprazole (PROTONIX) 40 MG tablet TAKE 1 TABLET(40 MG) BY MOUTH DAILY  . Probiotic Product (PROBIOTIC DAILY PO) Take 3 capsules by mouth daily.  . rosuvastatin (CRESTOR) 20 MG tablet TAKE 1 TABLET(20  MG) BY MOUTH DAILY  . Diclofenac Sodium (PENNSAID) 2 % SOLN Apply 10 drops topically 2 (two) times daily as needed. (Patient not taking: Reported on 12/31/2019)  . Multiple Vitamins-Minerals (ZINC PO) Take by mouth.  . NON FORMULARY Blood balance formula Ordered from Dr. Irena Cords 1 capsule daily.  Marland Kitchen OVER THE COUNTER MEDICATION OTC immune system booster daily   No facility-administered encounter medications on file as of 12/31/2019.    Activities of Daily Living In your present state of health, do you have any difficulty performing the following activities: 12/31/2019 03/19/2019  Hearing? N N  Vision? Y Y  Comment trouble seeing oout off left eye left eye cataract  Difficulty concentrating or making decisions? N N  Walking or climbing stairs? N N  Dressing or bathing? N N  Doing errands, shopping? N N  Preparing Food and eating ? N N  Using the Toilet? N N  In the past six months, have you accidently leaked urine? Y Y  Comment wears a pad wear pull ups  Do you have problems with loss of bowel control? N N  Managing your Medications? N N  Managing your Finances? N N  Housekeeping or managing your Housekeeping? N N  Some recent data might be hidden    Patient Care Team: [name], MD as PCP - General (Internal Medicine) [name] as Social Worker [place], Claudette Stapler, RN as Case Manager [place], Royce Macadamia, [facility] as Pharmacist (Pharmacist)    Assessment:   This is a routine wellness examination for [place].  Exercise Activities and Dietary recommendations Current Exercise Habits: The patient does not participate in regular exercise at present  Goals    . "I would like to better manage my medications" (pt-stated)     Current Barriers:  . Chronic Disease Management support and education needs related to HTN, DMII, CKDIII, Polypharmacy  Nurse Case Manager Clinical Goal(s):  Marland Kitchen Over the next 90 days, patient will work with the CCM team to address needs related to Medication Management    Interventions:  .  Evaluation of current treatment plan related to Medication Management and patient's adherence to plan as established by provider. . Provided education to patient re: pill packaging for easier polypharmacy management  . Collaborated with embedded Pharm D Lottie Dawson regarding collaboration needed to discuss pill packaging system with patient and spouse . Discussed plans with patient for ongoing care management follow up and provided patient with direct contact information for care management team  Patient Self Care Activities:  . Patient verbalizes understanding of plan to collaborate with embedded Pharm D Lottie Dawson regarding best options for optimal medication management  . Self administers medications as prescribed . Attends all scheduled provider appointments . Calls pharmacy for medication refills . Performs ADL's independently . Performs IADL's independently . Calls provider office for new concerns or questions   Initial goal documentation     . I would like to keep my blood sugar at goal & maintain my A1c (pt-stated)     Pharmacist Clinical Goal(s):  Marland Kitchen Over the next 90 days, patient with work with PharmD and primary care provider to address disease state management of diabetes  Current Barriers:  . Diabetes: T2DM; A1c 6.5% on 10/02/19 (was 6.4% on 03/19/19-->6.3% on 06/19/19)  Encouraged patient to increase water intake and try to get a few more steps in  . Current antihyperglycemic regimen: Trulicity 0.75mg  once weekly (patient stable on this dose) One Touch Verio glucometer/supplies . Denies hypoglycemic symptoms, including dizziness, lightheadedness, shaking, sweating . Denies hyperglycemic symptoms, including polyuria, polydipsia, polyphagia, nocturia, blurred vision, neuropathy . Current exercise: walking . Current blood glucose readings: FBG 100-120, reports FBG<130; notes that BG is higher when patient eats desserts,etc . Cardiovascular risk  reduction: o Current hypertensive regimen: amlodipine 5 /benazepril 10, Atenolol 50/chlorthalidone 25 (patient states her BP used to be better controlled when she was on combination therapy.  Upon reviewed of pharmacy claims, patient remains on 2 combination medications for BP listed above.  Will remind patient that she remains on these two combination medications for BP (total of 4 medications targeted for BP).  Last BP in office was 130/74.  Patient encouraged to continue taking BP at home and report back. o Current hyperlipidemia regimen: rosuvastatin 20 mg daily (getting 90 DS)  LDL 93 on 8/20 o Patient continues to take daily ASA o Patient interested in pill packs, will set up at a later time  Interventions: Clinic visit with patient on 11/03/2019 . Comprehensive medication review performed . Patient inquiring about multivitamin for immune support.  Recommended patient take a complete multivitamin (in addition to all basic vitamins-->one containing zinc, vitD are helpful for immune support/daily health).  Patient is also taking elderberry gummies to aid in immune support. . Reviewed ADA recommended "diabetes-friendly" diet  (reviewed healthy snack/food options) . 11/03/2019-->handouts provided on DM health diet, carbohydrate counting and cooking with diabetes   Patient Self Care Activities:  . Patient will check blood glucose daily (AM) , document, and provide at future appointments . Patient will focus on medication adherence by 100% medication adherence, improved BP control, blood glucose at goal range . Patient will take medications as prescribed . Patient will contact provider with any episodes of hypoglycemia . Patient will report any questions or concerns to provider    Please see past updates related to this goal by clicking on the "Past Updates" button in the selected goal      . To better manage my constipation     Current Barriers:  Marland Kitchen Knowledge Deficits related to  treatment  management for constipation . Chronic Disease Management support and education needs related to HTN, DMII, CKDIII, constipation   Nurse Case Manager Clinical Goal(s):  Marland Kitchen Over the next 90 days, patient will work with the [place] CM and PCP to address needs related to impaired bowel elimination/constipation  . Over the next 90 days, patient will experience decrease in ED visits. ED visits in last 6 months = 1 ED visit on 10/30/19; dx: acute diverticulitis  Interventions:  . Evaluation of current treatment plan related to constipation and patient's adherence to plan as established by provider. . Discussed plans with patient for ongoing care management follow up and provided patient with direct contact information for care management team . Provided patient with printed educational materials related to Self health management of constipation   Patient Self Care Activities:  . Self administers medications as prescribed . Attends all scheduled provider appointments . Calls pharmacy for medication refills . Performs ADL's independently . Performs IADL's independently . Calls provider office for new concerns or questions  Initial goal documentation     . To have less fatigue     Current Barriers:  Marland Kitchen Knowledge Deficits related to evaluation and treatment of fatigue . Chronic Disease Management support and education needs related to HTN, DMII, CKDIII, Fatigue  Nurse Case Manager Clinical Goal(s):  Marland Kitchen Over the next 90 days, patient will work with the [facility] CM and PCP to address needs related to diagnosis and treatment of fatigue  Interventions:  . Evaluation of current treatment plan related to fatigue and patient's adherence to plan as established by provider. . Discussed plans with patient for ongoing care management follow up and provided patient with direct contact information for care management team . Provided patient with printed educational materials related to Fatigue  Patient Self  Care Activities:  . Self administers medications as prescribed . Attends all scheduled provider appointments . Calls pharmacy for medication refills . Performs ADL's independently . Performs IADL's independently . Calls provider office for new concerns or questions  Initial goal documentation        Fall Risk Fall Risk  12/31/2019 06/19/2019 04/29/2019 03/19/2019 03/19/2019  Falls in the past year? 0 0 0 0 0  Risk for fall due to : Medication side effect - - Medication side effect -  Follow up Falls evaluation completed;Education provided;Falls prevention discussed - - Education provided;Falls prevention discussed -   Is the patient's home free of loose throw rugs in walkways, pet beds, electrical cords, etc?   yes      Grab bars in the bathroom? yes      Handrails on the stairs?   yes      Adequate lighting?   yes  Timed Get Up and Go performed: n/a  Depression Screen PHQ 2/9 Scores 12/31/2019 04/29/2019 03/19/2019 03/19/2019  PHQ - 2 Score 0 0 0 0  PHQ- 9 Score 0 - 0 -     Cognitive Function     6CIT Screen 12/31/2019 03/19/2019  What Year? 0 points 0 points  What month? 3 points 0 points  What time? 0 points 0 points  Count back from 20 2 points 0 points  Months in reverse 4 points 0 points  Repeat phrase 2 points 2 points  Total Score 11 2    Immunization History  Administered Date(s) Administered  . Influenza Split 07/25/2012  . Influenza, High Dose Seasonal PF 10/08/2018, 10/02/2019  . Influenza,inj,Quad PF,6+ Mos 11/21/2013  . Influenza-Unspecified  07/25/2012, 11/21/2013  . Pneumococcal Polysaccharide-23 11/21/2013  . Td 01/29/1999, 11/21/2013    Qualifies for Shingles Vaccine? yes  Screening Tests Health Maintenance  Topic Date Due  . FOOT EXAM  05/06/1951  . OPHTHALMOLOGY EXAM  05/06/1951  . HEMOGLOBIN A1C  04/01/2020  . TETANUS/TDAP  11/22/2023  . COLONOSCOPY  06/08/2024  . INFLUENZA VACCINE  Completed  . DEXA SCAN  Completed  . PNA vac Low Risk Adult   Completed    Cancer Screenings: Lung: Low Dose CT Chest recommended if Age 39-80 years, 30 pack-year currently smoking OR have quit w/in 15years. Patient does not qualify. Breast:  Up to date on Mammogram? No   Up to date of Bone Density/Dexa? Yes Colorectal: up to date  Additional Screenings: : Hepatitis C Screening: n/a     Plan:     Patient has no goals set at this time.  I have personally reviewed and noted the following in the patient's chart:   . Medical and social history . Use of alcohol, tobacco or illicit drugs  . Current medications and supplements . Functional ability and status . Nutritional status . Physical activity . Advanced directives . List of other physicians . Hospitalizations, surgeries, and ER visits in previous 12 months . Vitals . Screenings to include cognitive, depression, and falls . Referrals and appointments  In addition, I have reviewed and discussed with patient certain preventive protocols, quality metrics, and best practice recommendations. A written personalized care plan for preventive services as well as general preventive health recommendations were provided to patient.     Kellie Simmering, LPN  [id number]

## 2019-12-31 NOTE — Patient Instructions (Signed)
[name] , Thank you for taking time to come for your Medicare Wellness Visit. I appreciate your ongoing commitment to your health goals. Please review the following plan we discussed and let me know if I can assist you in the future.   Screening recommendations/referrals: Colonoscopy: 05/2019 Mammogram: 02/2018 Bone Density: 09/2015 Recommended yearly ophthalmology/optometry visit for glaucoma screening and checkup Recommended yearly dental visit for hygiene and checkup  Vaccinations: Influenza vaccine: 09/2019 Pneumococcal vaccine: 03/2015 Tdap vaccine: 03/2015 Shingles vaccine: discussed    Advanced directives: Advance directive discussed with you today. I have provided a copy for you to complete at home and have notarized. Once this is complete please bring a copy in to our office so we can scan it into your chart.   Conditions/risks identified: obesity  Next appointment: 01/05/2020 at 2:15   Preventive Care 78 Years and Older, Female Preventive care refers to lifestyle choices and visits with your health care provider that can promote health and wellness. What does preventive care include?  A yearly physical exam. This is also called an annual well check.  Dental exams once or twice a year.  Routine eye exams. Ask your health care provider how often you should have your eyes checked.  Personal lifestyle choices, including:  Daily care of your teeth and gums.  Regular physical activity.  Eating a healthy diet.  Avoiding tobacco and drug use.  Limiting alcohol use.  Practicing safe sex.  Taking low-dose aspirin every day.  Taking vitamin and mineral supplements as recommended by your health care provider. What happens during an annual well check? The services and screenings done by your health care provider during your annual well check will depend on your age, overall health, lifestyle risk factors, and family history of disease. Counseling  Your health care  provider may ask you questions about your:  Alcohol use.  Tobacco use.  Drug use.  Emotional well-being.  Home and relationship well-being.  Sexual activity.  Eating habits.  History of falls.  Memory and ability to understand (cognition).  Work and work Statistician.  Reproductive health. Screening  You may have the following tests or measurements:  Height, weight, and BMI.  Blood pressure.  Lipid and cholesterol levels. These may be checked every 5 years, or more frequently if you are over 15 years old.  Skin check.  Lung cancer screening. You may have this screening every year starting at age 58 if you have a 30-pack-year history of smoking and currently smoke or have quit within the past 15 years.  Fecal occult blood test (FOBT) of the stool. You may have this test every year starting at age 88.  Flexible sigmoidoscopy or colonoscopy. You may have a sigmoidoscopy every 5 years or a colonoscopy every 10 years starting at age 23.  Hepatitis C blood test.  Hepatitis B blood test.  Sexually transmitted disease (STD) testing.  Diabetes screening. This is done by checking your blood sugar (glucose) after you have not eaten for a while (fasting). You may have this done every 1-3 years.  Bone density scan. This is done to screen for osteoporosis. You may have this done starting at age 16.  Mammogram. This may be done every 1-2 years. Talk to your health care provider about how often you should have regular mammograms. Talk with your health care provider about your test results, treatment options, and if necessary, the need for more tests. Vaccines  Your health care provider may recommend certain vaccines, such as:  Influenza vaccine. This is recommended every year.  Tetanus, diphtheria, and acellular pertussis (Tdap, Td) vaccine. You may need a Td booster every 10 years.  Zoster vaccine. You may need this after age 85.  Pneumococcal 13-valent conjugate (PCV13)  vaccine. One dose is recommended after age 5.  Pneumococcal polysaccharide (PPSV23) vaccine. One dose is recommended after age 14. Talk to your health care provider about which screenings and vaccines you need and how often you need them. This information is not intended to replace advice given to you by your health care provider. Make sure you discuss any questions you have with your health care provider. Document Released: 11/12/2015 Document Revised: 07/05/2016 Document Reviewed: 08/17/2015 Elsevier Interactive Patient Education  2017 [place] Prevention in the Home Falls can cause injuries. They can happen to people of all ages. There are many things you can do to make your home safe and to help prevent falls. What can I do on the outside of my home?  Regularly fix the edges of walkways and driveways and fix any cracks.  Remove anything that might make you trip as you walk through a door, such as a raised step or threshold.  Trim any bushes or trees on the path to your home.  Use bright outdoor lighting.  Clear any walking paths of anything that might make someone trip, such as rocks or tools.  Regularly check to see if handrails are loose or broken. Make sure that both sides of any steps have handrails.  Any raised decks and porches should have guardrails on the edges.  Have any leaves, snow, or ice cleared regularly.  Use sand or salt on walking paths during winter.  Clean up any spills in your garage right away. This includes oil or grease spills. What can I do in the bathroom?  Use night lights.  Install grab bars by the toilet and in the tub and shower. Do not use towel bars as grab bars.  Use non-skid mats or decals in the tub or shower.  If you need to sit down in the shower, use a plastic, non-slip stool.  Keep the floor dry. Clean up any water that spills on the floor as soon as it happens.  Remove soap buildup in the tub or shower regularly.   Attach bath mats securely with double-sided non-slip rug tape.  Do not have throw rugs and other things on the floor that can make you trip. What can I do in the bedroom?  Use night lights.  Make sure that you have a light by your bed that is easy to reach.  Do not use any sheets or blankets that are too big for your bed. They should not hang down onto the floor.  Have a firm chair that has side arms. You can use this for support while you get dressed.  Do not have throw rugs and other things on the floor that can make you trip. What can I do in the kitchen?  Clean up any spills right away.  Avoid walking on wet floors.  Keep items that you use a lot in easy-to-reach places.  If you need to reach something above you, use a strong step stool that has a grab bar.  Keep electrical cords out of the way.  Do not use floor polish or wax that makes floors slippery. If you must use wax, use non-skid floor wax.  Do not have throw rugs and other things on the floor that  can make you trip. What can I do with my stairs?  Do not leave any items on the stairs.  Make sure that there are handrails on both sides of the stairs and use them. Fix handrails that are broken or loose. Make sure that handrails are as long as the stairways.  Check any carpeting to make sure that it is firmly attached to the stairs. Fix any carpet that is loose or worn.  Avoid having throw rugs at the top or bottom of the stairs. If you do have throw rugs, attach them to the floor with carpet tape.  Make sure that you have a light switch at the top of the stairs and the bottom of the stairs. If you do not have them, ask someone to add them for you. What else can I do to help prevent falls?  Wear shoes that:  Do not have high heels.  Have rubber bottoms.  Are comfortable and fit you well.  Are closed at the toe. Do not wear sandals.  If you use a stepladder:  Make sure that it is fully opened. Do not climb  a closed stepladder.  Make sure that both sides of the stepladder are locked into place.  Ask someone to hold it for you, if possible.  Clearly mark and make sure that you can see:  Any grab bars or handrails.  First and last steps.  Where the edge of each step is.  Use tools that help you move around (mobility aids) if they are needed. These include:  Canes.  Walkers.  Scooters.  Crutches.  Turn on the lights when you go into a dark area. Replace any light bulbs as soon as they burn out.  Set up your furniture so you have a clear path. Avoid moving your furniture around.  If any of your floors are uneven, fix them.  If there are any pets around you, be aware of where they are.  Review your medicines with your doctor. Some medicines can make you feel dizzy. This can increase your chance of falling. Ask your doctor what other things that you can do to help prevent falls. This information is not intended to replace advice given to you by your health care provider. Make sure you discuss any questions you have with your health care provider. Document Released: 08/12/2009 Document Revised: 03/23/2016 Document Reviewed: 11/20/2014 Elsevier Interactive Patient Education  2017 [employer].

## 2020-01-05 ENCOUNTER — Ambulatory Visit (INDEPENDENT_AMBULATORY_CARE_PROVIDER_SITE_OTHER): Payer: Medicare Other | Admitting: Nurse Practitioner

## 2020-01-05 ENCOUNTER — Other Ambulatory Visit: Payer: Self-pay

## 2020-01-05 ENCOUNTER — Encounter: Payer: Self-pay | Admitting: Nurse Practitioner

## 2020-01-05 VITALS — BP 138/80 | HR 73 | Temp 98.2°F | Ht 69.0 in | Wt 248.4 lb

## 2020-01-05 DIAGNOSIS — E1122 Type 2 diabetes mellitus with diabetic chronic kidney disease: Secondary | ICD-10-CM | POA: Diagnosis not present

## 2020-01-05 DIAGNOSIS — M1A9XX0 Chronic gout, unspecified, without tophus (tophi): Secondary | ICD-10-CM | POA: Diagnosis not present

## 2020-01-05 DIAGNOSIS — I10 Essential (primary) hypertension: Secondary | ICD-10-CM | POA: Diagnosis not present

## 2020-01-05 DIAGNOSIS — I129 Hypertensive chronic kidney disease with stage 1 through stage 4 chronic kidney disease, or unspecified chronic kidney disease: Secondary | ICD-10-CM

## 2020-01-05 DIAGNOSIS — M25551 Pain in right hip: Secondary | ICD-10-CM | POA: Diagnosis not present

## 2020-01-05 DIAGNOSIS — N183 Chronic kidney disease, stage 3 unspecified: Secondary | ICD-10-CM | POA: Diagnosis not present

## 2020-01-05 MED ORDER — KETOROLAC TROMETHAMINE 60 MG/2ML IM SOLN
60.0000 mg | Freq: Once | INTRAMUSCULAR | Status: AC
  Administered 2020-01-05: 60 mg via INTRAMUSCULAR

## 2020-01-05 NOTE — Progress Notes (Signed)
This visit occurred during the SARS-CoV-2 public health emergency.  Safety protocols were in place, including screening questions prior to the visit, additional usage of staff PPE, and extensive cleaning of exam room while observing appropriate contact time as indicated for disinfecting solutions.  Subjective:     Patient ID: [name] , female    DOB: 1941/03/18 , 79 y.o.   MRN: [id number]   Chief Complaint  Patient presents with  . Hip Pain    patient stated her hip has been hurting her when she got her first covid vaccine     HPI  She had her second covid vaccine on this past Thursday.  She does report she is eating food she should not be eating.  She is using light salt - [employer] and garlic salt.  She has bilateral lower extremity swelling.     Hip Pain  The incident occurred 3 to 5 days ago. Incident location: noticed after having her covid vaccine. The pain is present in the right hip. The quality of the pain is described as aching. The patient is experiencing no pain. Pertinent negatives include no inability to bear weight, loss of motion, numbness or tingling. The symptoms are aggravated by movement (lying on the hip).     Past Medical History:  Diagnosis Date  . Abdominal pain   . Arthritis    cervical disc degeneration/ oa left knee, carpal tunnel rt wrist, adhesive capsulitis right shoulder, rt hand weakness; lumbar degeneration  . Carpal tunnel syndrome   . 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 Constipation   . Diabetes mellitus without complication ([place])    borderline - diet control  . Diverticulitis hx of  . Diverticulosis   . E. coli infection    2020  . Frequent UTI    hx of urethral injury during colon surgery - states frequent uti's since  . GERD (gastroesophageal reflux disease)    . H/O hiatal hernia   . History of palpitations    in the past  . History of shingles    has a lingering itching on back where shingles were  . Hyperlipidemia   . Hypertension   . Numbness and tingling in right hand    pt. states has numbness of right hand very frequently-watch positioning  . Obesity   . Osteoarthritis   . Osteoporosis   . Pain    pain left knee and pain right hip and right groin  . Personal history of colonic polyps-adenoma 08/26/2008  . Pneumonia 2005  . Vitamin D deficiency      Family History  Problem Relation Age of Onset  . Breast cancer Mother 106  . Hypertension Mother   . Prostate cancer Father   . Hypertension Father   . Dementia Sister   . Lung cancer Brother   . Hypertension Brother   . COPD Brother   . Cancer Maternal Grandmother   . Arthritis Sister   . Hypertension Sister   . Arthritis Sister   . Hypertension Child   . Hypertension Child   . Hypertension Child   . Colon polyps Neg Hx   . Esophageal cancer Neg Hx   . Rectal cancer Neg Hx   . Stomach cancer Neg Hx   . Colon cancer Neg Hx  Current Outpatient Medications:  .  allopurinol (ZYLOPRIM) 300 MG tablet, TAKE 1 TABLET(300 MG) BY MOUTH DAILY, Disp: 90 tablet, Rfl: 0 .  amLODipine-benazepril (LOTREL) 5-10 MG capsule, TAKE 1 CAPSULE BY MOUTH EVERY DAY, Disp: 90 capsule, Rfl: 0 .  aspirin EC 81 MG tablet, Take 1 tablet (81 mg total) by mouth daily., Disp: 90 tablet, Rfl: 0 .  atenolol-chlorthalidone (TENORETIC) 50-25 MG tablet, TAKE 1 TABLET BY MOUTH EVERY DAY AT 11 AM, Disp: 90 tablet, Rfl: 0 .  Dulaglutide (TRULICITY) 2.35 TD/3.2KG SOPN, INJECT 0.5 ML(0.75 MG) SUBCUTANEOUSLY EVERY WEEK IN ABDOMEN, THIGH OR UPPER ARM ROTATING INJECTION SITE, Disp: 2 mL, Rfl: 0 .  NON FORMULARY, Blood balance formula Ordered from Dr. Irena Cords 1 capsule daily., Disp: , Rfl:  .  ONETOUCH DELICA LANCETS 25K MISC, Use to check blood glucose once daily, Disp: 100 each, Rfl: 3 .  ONETOUCH VERIO test strip,  USE AS DIRECTED TO TEST BLOOD SUGAR BID, Disp: , Rfl:  .  OVER THE COUNTER MEDICATION, OTC immune system booster daily, Disp: , Rfl:  .  pantoprazole (PROTONIX) 40 MG tablet, TAKE 1 TABLET(40 MG) BY MOUTH DAILY, Disp: 90 tablet, Rfl: 1 .  Probiotic Product (PROBIOTIC DAILY PO), Take 3 capsules by mouth daily., Disp: , Rfl:  .  rosuvastatin (CRESTOR) 20 MG tablet, TAKE 1 TABLET(20 MG) BY MOUTH DAILY, Disp: 90 tablet, Rfl: 0 .  Diclofenac Sodium (PENNSAID) 2 % SOLN, Apply 10 drops topically 2 (two) times daily as needed. (Patient not taking: Reported on 12/31/2019), Disp: 112 g, Rfl: 1 .  Multiple Vitamins-Minerals (ZINC PO), Take by mouth., Disp: , Rfl:    Allergies  Allergen Reactions  . Atorvastatin Calcium Itching    Other reaction(s): Itching  . Bee Venom Hives  . Lipitor [Atorvastatin Calcium] Itching  . Oxycodone Other (See Comments)    hallucinations  . Vicodin [Hydrocodone-Acetaminophen] Other (See Comments)    hallucinations     Review of Systems  Constitutional: Negative for fatigue.  Cardiovascular: Negative.  Negative for chest pain, palpitations and leg swelling.  Endocrine: Negative for polydipsia, polyphagia and polyuria.  Neurological: Negative for dizziness, tingling, numbness and headaches.  Psychiatric/Behavioral: Negative.      Today's Vitals   01/05/20 1421  BP: 138/80  Pulse: 73  Temp: 98.2 F (36.8 C)  Weight: 248 lb 6.4 oz (112.7 kg)  Height: _0  (1.753 m)  PainSc: 8   PainLoc: Hip   Body mass index is 36.68 kg/m.   Objective:  Physical Exam Constitutional:      General: She is not in acute distress.    Appearance: Normal appearance.  Cardiovascular:     Rate and Rhythm: Normal rate and regular rhythm.     Pulses: Normal pulses.     Heart sounds: Normal heart sounds. No murmur.  Pulmonary:     Effort: Pulmonary effort is normal. No respiratory distress.     Breath sounds: Normal breath sounds.  Musculoskeletal:        General: Tenderness  (right hip) present.  Neurological:     General: No focal deficit present.     Mental Status: She is alert and oriented to person, place, and time.  Psychiatric:        Mood and Affect: Mood normal.        Behavior: Behavior normal.        Thought Content: Thought content normal.        Judgment: Judgment normal.  Assessment And Plan:     1. Right hip pain  Moderate pain with palpation and internal rotation  Will treat with toradol vs kenalog due to recent covid vaccine and blood sugars have been slightly elevated - ketorolac (TORADOL) injection 60 mg  2. Type 2 diabetes mellitus with stage 3 chronic kidney disease, without long-term current use of insulin, unspecified whether stage 3a or 3b CKD (HCC)  Slightly elevated blood sugars  Will check HgbA1c - Hemoglobin A1c - CMP14+EGFR  3. Essential hypertension B/P is fairly controlled.  CMP ordered to check renal function.  The importance of regular exercise and dietary modification was stressed to the patient. - CMP14+EGFR - Lipid panel  4. Chronic gout without tophus, unspecified cause, unspecified site  She is taking allopurinol daily with good relief - Uric acid - Lipid panel   [name], FNP    THE PATIENT IS ENCOURAGED TO PRACTICE SOCIAL DISTANCING DUE TO THE COVID-19 PANDEMIC.

## 2020-01-06 ENCOUNTER — Ambulatory Visit (INDEPENDENT_AMBULATORY_CARE_PROVIDER_SITE_OTHER): Payer: Medicare Other | Admitting: Pharmacist

## 2020-01-06 DIAGNOSIS — I10 Essential (primary) hypertension: Secondary | ICD-10-CM

## 2020-01-06 DIAGNOSIS — N183 Chronic kidney disease, stage 3 unspecified: Secondary | ICD-10-CM | POA: Diagnosis not present

## 2020-01-06 DIAGNOSIS — E1122 Type 2 diabetes mellitus with diabetic chronic kidney disease: Secondary | ICD-10-CM | POA: Diagnosis not present

## 2020-01-06 LAB — CMP14+EGFR
ALT: 24 IU/L (ref 0–32)
AST: 29 IU/L (ref 0–40)
Albumin/Globulin Ratio: 1.5 (ref 1.2–2.2)
Albumin: 4.1 g/dL (ref 3.7–4.7)
Alkaline Phosphatase: 60 IU/L (ref 39–117)
BUN/Creatinine Ratio: 17 (ref 12–28)
BUN: 19 mg/dL (ref 8–27)
Bilirubin Total: 0.8 mg/dL (ref 0.0–1.2)
CO2: 26 mmol/L (ref 20–29)
Calcium: 9.3 mg/dL (ref 8.7–10.3)
Chloride: 102 mmol/L (ref 96–106)
Creatinine, Ser: 1.14 mg/dL — ABNORMAL HIGH (ref 0.57–1.00)
GFR calc Af Amer: 54 mL/min/{1.73_m2} — ABNORMAL LOW (ref >59)
GFR calc non Af Amer: 46 mL/min/{1.73_m2} — ABNORMAL LOW (ref >59)
Globulin, Total: 2.8 g/dL (ref 1.5–4.5)
Glucose: 112 mg/dL — ABNORMAL HIGH (ref 65–99)
Potassium: 3.9 mmol/L (ref 3.5–5.2)
Sodium: 141 mmol/L (ref 134–144)
Total Protein: 6.9 g/dL (ref 6.0–8.5)

## 2020-01-06 LAB — LIPID PANEL
Chol/HDL Ratio: 4.7 ratio — ABNORMAL HIGH (ref 0.0–4.4)
Cholesterol, Total: 182 mg/dL (ref 100–199)
HDL: 39 mg/dL — ABNORMAL LOW (ref >39)
LDL Chol Calc (NIH): 101 mg/dL — ABNORMAL HIGH (ref 0–99)
Triglycerides: 249 mg/dL — ABNORMAL HIGH (ref 0–149)
VLDL Cholesterol Cal: 42 mg/dL — ABNORMAL HIGH (ref 5–40)

## 2020-01-06 LAB — HEMOGLOBIN A1C
Est. average glucose Bld gHb Est-mCnc: 131 mg/dL
Hgb A1c MFr Bld: 6.2 % — ABNORMAL HIGH (ref 4.8–5.6)

## 2020-01-06 LAB — URIC ACID: Uric Acid: 4.4 mg/dL (ref 3.1–7.9)

## 2020-01-06 NOTE — Progress Notes (Signed)
Chronic Care Management   Visit Note  01/05/2020 Name: [name] MRN: [id number] DOB: Jun 11, 1941  Referred by: [name], MD Reason for referral : Chronic Care Management and Diabetes   [name] is a 79 y.o. year old female who is a primary care patient of [name], MD. The CCM team was consulted for assistance with chronic disease management and care coordination needs related to DMII  Review of patient status, including review of consultants reports, relevant laboratory and other test results, and collaboration with appropriate care team members and the patient's provider was performed as part of comprehensive patient evaluation and provision of chronic care management services.    SDOH (Social Determinants of Health) assessments performed: Yes See Care Plan activities for detailed interventions related to SDOH)     Medications: Outpatient Encounter Medications as of 01/06/2020  Medication Sig  . allopurinol (ZYLOPRIM) 300 MG tablet TAKE 1 TABLET(300 MG) BY MOUTH DAILY  . amLODipine-benazepril (LOTREL) 5-10 MG capsule TAKE 1 CAPSULE BY MOUTH EVERY DAY  . aspirin EC 81 MG tablet Take 1 tablet (81 mg total) by mouth daily.  Marland Kitchen atenolol-chlorthalidone (TENORETIC) 50-25 MG tablet TAKE 1 TABLET BY MOUTH EVERY DAY AT 11 AM  . Diclofenac Sodium (PENNSAID) 2 % SOLN Apply 10 drops topically 2 (two) times daily as needed. (Patient not taking: Reported on 12/31/2019)  . Dulaglutide (TRULICITY) 2.33 ID/5.6YS SOPN INJECT 0.5 ML(0.75 MG) SUBCUTANEOUSLY EVERY WEEK IN ABDOMEN, THIGH OR UPPER ARM ROTATING INJECTION SITE  . Multiple Vitamins-Minerals (ZINC PO) Take by mouth.  . NON FORMULARY Blood balance formula Ordered from Dr. Irena Cords 1 capsule daily.  [name] DELICA LANCETS 16O MISC Use to check blood glucose once daily  . ONETOUCH VERIO test strip USE AS DIRECTED TO TEST BLOOD SUGAR BID  . OVER THE COUNTER MEDICATION OTC immune system booster daily  . pantoprazole (PROTONIX) 40 MG  tablet TAKE 1 TABLET(40 MG) BY MOUTH DAILY  . Probiotic Product (PROBIOTIC DAILY PO) Take 3 capsules by mouth daily.  . rosuvastatin (CRESTOR) 20 MG tablet TAKE 1 TABLET(20 MG) BY MOUTH DAILY   No facility-administered encounter medications on file as of 01/06/2020.     Objective:   Goals Addressed            This Visit's Progress     Patient Stated   . I would like to keep my blood sugar at goal & maintain my A1c (pt-stated)       Pharmacist Clinical Goal(s):  Marland Kitchen Over the next 90 days, patient with work with PharmD and primary care provider to address disease state management of diabetes  Current Barriers:  . Diabetes: T2DM; A1c 6.2% on 01/05/20 . 01/05/20-met with patient in clinic-->Encouraged patient to continue exercise regimen and water intake; pill box given to aid in compliance with all medications . Current antihyperglycemic regimen: Trulicity 3.72BM once weekly (patient stable on this dose from a BG standpoint, however I would like patient to consider increase to 1.72m as patient could benefit from weight loss, will discuss with PCP) One Touch Verio glucometer/supplies . Denies hypoglycemic symptoms, including dizziness, lightheadedness, shaking, sweating . Denies hyperglycemic symptoms, including polyuria, polydipsia, polyphagia, nocturia, blurred vision, neuropathy . Current exercise: walking . Current blood glucose readings: FBG 100-120, reports FBG<130; notes that BG is higher when patient eats desserts,etc . Cardiovascular risk reduction: o Current hypertensive regimen: amlodipine 5 /benazepril 10, Atenolol 50/chlorthalidone 25 (patient states her BP used to be better controlled when she was on combination therapy.  Upon reviewed of pharmacy claims, patient remains on 2 combination medications for BP listed above.  Will remind patient that she remains on these two combination medications for BP (total of 4 medications targeted for BP).  Last BP in office was 130/74.  Patient  encouraged to continue taking BP at home and report back. o Current hyperlipidemia regimen: rosuvastatin 20 mg daily (getting 90 DS)  LDL 93 on 8/20 o Patient continues to take daily ASA o Patient interested in pill packs, pill box given today for compliance with HTN regiment.   Interventions: Clinic visit with patient on 01/05/2020 . Comprehensive medication review performed . Patient inquiring about multivitamin for immune support.  Recommended patient take a complete multivitamin (in addition to all basic vitamins-->one containing zinc, vitD are helpful for immune support/daily health).  Patient is also taking elderberry gummies to aid in immune support. . Reviewed ADA recommended "diabetes-friendly" diet  (reviewed healthy snack/food options) . 11/03/2019-->handouts provided on DM health diet, carbohydrate counting and cooking with diabetes   Patient Self Care Activities:  . Patient will check blood glucose daily (AM) , document, and provide at future appointments . Patient will focus on medication adherence by 100% medication adherence, improved BP control, blood glucose at goal range . Patient will take medications as prescribed . Patient will contact provider with any episodes of hypoglycemia . Patient will report any questions or concerns to provider    Please see past updates related to this goal by clicking on the "Past Updates" button in the selected goal         Plan:   The care management team will reach out to the patient again over the next 90 days.   Provider Signature Regina Eck, PharmD, BCPS Clinical Pharmacist, [facility] [place]: [phone]

## 2020-01-06 NOTE — Patient Instructions (Signed)
Visit Information  Goals Addressed            This Visit's Progress     Patient Stated   . I would like to keep my blood sugar at goal & maintain my A1c (pt-stated)       Pharmacist Clinical Goal(s):  Marland Kitchen Over the next 90 days, patient with work with PharmD and primary care provider to address disease state management of diabetes  Current Barriers:  . Diabetes: T2DM; A1c 6.2% on 01/05/20 . 01/05/20-met with patient in clinic-->Encouraged patient to continue exercise regimen and water intake; pill box given to aid in compliance with all medications . Current antihyperglycemic regimen: Trulicity 4.54UJ once weekly (patient stable on this dose from a BG standpoint, however I would like to increase to 1.40m as patient could benefit from weight loss, will discuss with PCP) One Touch Verio glucometer/supplies . Denies hypoglycemic symptoms, including dizziness, lightheadedness, shaking, sweating . Denies hyperglycemic symptoms, including polyuria, polydipsia, polyphagia, nocturia, blurred vision, neuropathy . Current exercise: walking . Current blood glucose readings: FBG 100-120, reports FBG<130; notes that BG is higher when patient eats desserts,etc . Cardiovascular risk reduction: o Current hypertensive regimen: amlodipine 5 /benazepril 10, Atenolol 50/chlorthalidone 25 (patient states her BP used to be better controlled when she was on combination therapy.  Upon reviewed of pharmacy claims, patient remains on 2 combination medications for BP listed above.  Will remind patient that she remains on these two combination medications for BP (total of 4 medications targeted for BP).  Last BP in office was 130/74.  Patient encouraged to continue taking BP at home and report back. o Current hyperlipidemia regimen: rosuvastatin 20 mg daily (getting 90 DS)  LDL 93 on 8/20 o Patient continues to take daily ASA o Patient interested in pill packs, pill box given today for compliance with HTN regiment.    Interventions: Clinic visit with patient on 01/05/2020 . Comprehensive medication review performed . Patient inquiring about multivitamin for immune support.  Recommended patient take a complete multivitamin (in addition to all basic vitamins-->one containing zinc, vitD are helpful for immune support/daily health).  Patient is also taking elderberry gummies to aid in immune support. . Reviewed ADA recommended "diabetes-friendly" diet  (reviewed healthy snack/food options) . 11/03/2019-->handouts provided on DM health diet, carbohydrate counting and cooking with diabetes   Patient Self Care Activities:  . Patient will check blood glucose daily (AM) , document, and provide at future appointments . Patient will focus on medication adherence by 100% medication adherence, improved BP control, blood glucose at goal range . Patient will take medications as prescribed . Patient will contact provider with any episodes of hypoglycemia . Patient will report any questions or concerns to provider    Please see past updates related to this goal by clicking on the "Past Updates" button in the selected goal         The patient verbalized understanding of instructions provided today and declined a print copy of patient instruction materials.   The care management team will reach out to the patient again over the next 30 days.   SIGNATURE JRegina Eck PharmD, BCPS Clinical Pharmacist, [facility] [place] [phone]

## 2020-01-12 ENCOUNTER — Telehealth: Payer: Self-pay

## 2020-01-12 NOTE — Telephone Encounter (Signed)
Mr. Holzmann was notified that the application for the handicap placard renewal is ready for pickup.

## 2020-01-20 ENCOUNTER — Telehealth: Payer: Self-pay

## 2020-01-28 ENCOUNTER — Ambulatory Visit: Payer: Self-pay

## 2020-01-28 DIAGNOSIS — N183 Chronic kidney disease, stage 3 unspecified: Secondary | ICD-10-CM

## 2020-01-28 DIAGNOSIS — E1122 Type 2 diabetes mellitus with diabetic chronic kidney disease: Secondary | ICD-10-CM

## 2020-01-28 NOTE — Chronic Care Management (AMB) (Signed)
  Chronic Care Management   Outreach Note  01/28/2020 Name: [name] MRN: [id number] DOB: 10-04-1941  Referred by: [name], MD Reason for referral : Care Coordination   SW placed an unsuccessful outbound call to the patient to assist with care coordination needs. HIPAA compliant voice message left requesting a return call.  Follow Up Plan: A member of the care management team will follow up with the patient over the next month.  [name], BSW, CDP Social Worker, Certified Dementia Practitioner [place] [facility] [phone]

## 2020-02-17 ENCOUNTER — Other Ambulatory Visit: Payer: Self-pay

## 2020-02-17 ENCOUNTER — Ambulatory Visit (INDEPENDENT_AMBULATORY_CARE_PROVIDER_SITE_OTHER): Payer: Medicare Other

## 2020-02-17 ENCOUNTER — Telehealth: Payer: Self-pay

## 2020-02-17 DIAGNOSIS — N183 Chronic kidney disease, stage 3 unspecified: Secondary | ICD-10-CM | POA: Diagnosis not present

## 2020-02-17 DIAGNOSIS — I10 Essential (primary) hypertension: Secondary | ICD-10-CM | POA: Diagnosis not present

## 2020-02-17 DIAGNOSIS — E1122 Type 2 diabetes mellitus with diabetic chronic kidney disease: Secondary | ICD-10-CM | POA: Diagnosis not present

## 2020-02-18 NOTE — Patient Instructions (Signed)
Visit Information  Goals Addressed      Patient Stated   . COMPLETED: "I would like to better manage my medications" (pt-stated)       Current Barriers:  . Chronic Disease Management support and education needs related to HTN, DMII, CKDIII, Polypharmacy  Nurse Case Manager Clinical Goal(s):  Marland Kitchen Over the next 90 days, patient will work with the CCM team to address needs related to Medication Management   CCM RN CM Interventions:  02/17/20 call completed with patient  . Evaluation of current treatment plan related to Medication Management and patient's adherence to plan as established by provider. . Determined patient is currently using a pill box and self filling/administering her medications . Determined patient does not wish to transition to pill package system at this time but will alert the CCM team and or PCP when ready to do so . Discussed plans with patient for ongoing care management follow up and provided patient with direct contact information for care management team  Patient Self Care Activities:  . Patient verbalizes understanding of plan to collaborate with embedded Pharm D Lottie Dawson regarding best options for optimal medication management  . Self administers medications as prescribed . Attends all scheduled provider appointments . Calls pharmacy for medication refills . Performs ADL's independently . Performs IADL's independently . Calls provider office for new concerns or questions   Initial goal documentation     . I would like to keep my blood sugar at goal & maintain my A1c (pt-stated)       Pharmacist Clinical Goal(s):  Marland Kitchen Over the next 90 days, patient with work with PharmD and primary care provider to address disease state management of diabetes  Current Barriers:  . Diabetes: T2DM; A1c 6.2% on 01/05/20 . 01/05/20-met with patient in clinic-->Encouraged patient to continue exercise regimen and water intake; pill box given to aid in compliance with all  medications . Current antihyperglycemic regimen: Trulicity 8.29HB once weekly (patient stable on this dose from a BG standpoint, however I would like patient to consider increase to 1.22m as patient could benefit from weight loss, will discuss with PCP) One Touch Verio glucometer/supplies . Denies hypoglycemic symptoms, including dizziness, lightheadedness, shaking, sweating . Denies hyperglycemic symptoms, including polyuria, polydipsia, polyphagia, nocturia, blurred vision, neuropathy . Current exercise: walking . Current blood glucose readings: FBG 100-120, reports FBG<130; notes that BG is higher when patient eats desserts,etc . Cardiovascular risk reduction: o Current hypertensive regimen: amlodipine 5 /benazepril 10, Atenolol 50/chlorthalidone 25 (patient states her BP used to be better controlled when she was on combination therapy.  Upon reviewed of pharmacy claims, patient remains on 2 combination medications for BP listed above.  Will remind patient that she remains on these two combination medications for BP (total of 4 medications targeted for BP).  Last BP in office was 130/74.  Patient encouraged to continue taking BP at home and report back. o Current hyperlipidemia regimen: rosuvastatin 20 mg daily (getting 90 DS)  LDL 93 on 8/20 o Patient continues to take daily ASA o Patient interested in pill packs, pill box given today for compliance with HTN regiment.   CARE PLAN ENTRY (see longitudinal plan of care for additional care plan information)  Current Barriers:  .Marland KitchenKnowledge Deficits related to disease process and Self Health management of DM . Chronic Disease Management support and education needs related to DM, HTN, CKDIII  Nurse Case Manager Clinical Goal(s):  .Marland KitchenNew 02/17/20 Over the next 90 days, patient will work  with CCM RN and PCP to address needs related to disease education and support to improve and or maintain Self Health management of DM and weight loss   CCM RN CM  Interventions: 02/17/20 call completed with patient  . Inter-disciplinary care team collaboration (see longitudinal plan of care) . Evaluation of current treatment plan related to DM and patient's adherence to plan as established by provider. . Provided education to patient re: daily glycemic target ranges; FBS 80-130, <180 after meals: Determined patient is running within range . Reviewed medications with patient and discussed embedded Pharm recommendations for increasing Trulicity to 6.7TI for benefits of weight loss: Determined patient is gradually losing weight and blood sugars are well controlled, she does not wish to increase Trulicity at this time . Discussed plans with patient for ongoing care management follow up and provided patient with direct contact information for care management team . Provided patient with printed educational materials related to Diabetes Safety Zone Tool; Life's Simple 7  Patient Self Care Activities:  . Patient will check blood glucose daily (AM) , document, and provide at future appointments . Patient will focus on medication adherence by 100% medication adherence, improved BP control, blood glucose at goal range . Patient will take medications as prescribed . Patient will contact provider with any episodes of hypoglycemia . Patient will report any questions or concerns to provider   Please see past updates related to this goal by clicking on the "Past Updates" button in the selected goal       Patient verbalizes understanding of instructions provided today.   Telephone follow up appointment with care management team member scheduled for: 03/12/20  Barb Merino, RN, BSN, CCM Care Management Coordinator [facility]  Direct Phone: [phone]

## 2020-02-18 NOTE — Chronic Care Management (AMB) (Signed)
Chronic Care Management   Follow Up Note   02/17/2020 Name: [name] MRN: [id number] DOB: February 23, 1941  Referred by: [name], MD Reason for referral : Chronic Care Management ([facility] RN Call )   [name] is a 79 y.o. year old female who is a primary care patient of [name], MD. The CCM team was consulted for assistance with chronic disease management and care coordination needs.    Review of patient status, including review of consultants reports, relevant laboratory and other test results, and collaboration with appropriate care team members and the patient's provider was performed as part of comprehensive patient evaluation and provision of chronic care management services.    SDOH (Social Determinants of Health) assessments performed: Yes See Care Plan activities for detailed interventions related to [place])   Placed outbound CCM RN CM follow up call to patient for a CCM update, care plan updated.     Outpatient Encounter Medications as of 02/17/2020  Medication Sig  . allopurinol (ZYLOPRIM) 300 MG tablet TAKE 1 TABLET(300 MG) BY MOUTH DAILY  . amLODipine-benazepril (LOTREL) 5-10 MG capsule TAKE 1 CAPSULE BY MOUTH EVERY DAY  . aspirin EC 81 MG tablet Take 1 tablet (81 mg total) by mouth daily.  Marland Kitchen atenolol-chlorthalidone (TENORETIC) 50-25 MG tablet TAKE 1 TABLET BY MOUTH EVERY DAY AT 11 AM  . Diclofenac Sodium (PENNSAID) 2 % SOLN Apply 10 drops topically 2 (two) times daily as needed. (Patient not taking: Reported on 12/31/2019)  . Dulaglutide (TRULICITY) 6.80 HO/1.2YQ SOPN INJECT 0.5 ML(0.75 MG) SUBCUTANEOUSLY EVERY WEEK IN ABDOMEN, THIGH OR UPPER ARM ROTATING INJECTION SITE  . Multiple Vitamins-Minerals (ZINC PO) Take by mouth.  . NON FORMULARY Blood balance formula Ordered from Dr. Irena Cords 1 capsule daily.  [name] DELICA LANCETS 82N MISC Use to check blood glucose once daily  . ONETOUCH VERIO test strip USE AS DIRECTED TO TEST BLOOD SUGAR BID  . OVER THE COUNTER  MEDICATION OTC immune system booster daily  . pantoprazole (PROTONIX) 40 MG tablet TAKE 1 TABLET(40 MG) BY MOUTH DAILY  . Probiotic Product (PROBIOTIC DAILY PO) Take 3 capsules by mouth daily.  . rosuvastatin (CRESTOR) 20 MG tablet TAKE 1 TABLET(20 MG) BY MOUTH DAILY   No facility-administered encounter medications on file as of 02/17/2020.     Objective:  Lab Results  Component Value Date   HGBA1C 6.2 (H) 01/05/2020   HGBA1C 6.5 (H) 10/02/2019   HGBA1C 6.3 (H) 06/19/2019   Lab Results  Component Value Date   MICROALBUR 10 03/19/2019   LDLCALC 101 (H) 01/05/2020   CREATININE 1.14 (H) 01/05/2020   BP Readings from Last 3 Encounters:  01/05/20 138/80  12/31/19 (!) 148/78  11/03/19 124/68    Goals Addressed      Patient Stated   . COMPLETED: "I would like to better manage my medications" (pt-stated)       Current Barriers:  . Chronic Disease Management support and education needs related to HTN, DMII, CKDIII, Polypharmacy  Nurse Case Manager Clinical Goal(s):  Marland Kitchen Over the next 90 days, patient will work with the CCM team to address needs related to Medication Management   CCM RN CM Interventions:  02/17/20 call completed with patient  . Evaluation of current treatment plan related to Medication Management and patient's adherence to plan as established by provider. . Determined patient is currently using a pill box and self filling/administering her medications . Determined patient does not wish to transition to pill package system at this time  but will alert the CCM team and or PCP when ready to do so . Discussed plans with patient for ongoing care management follow up and provided patient with direct contact information for care management team  Patient Self Care Activities:  . Patient verbalizes understanding of plan to collaborate with embedded Pharm D Lottie Dawson regarding best options for optimal medication management  . Self administers medications as  prescribed . Attends all scheduled provider appointments . Calls pharmacy for medication refills . Performs ADL's independently . Performs IADL's independently . Calls provider office for new concerns or questions  Initial goal documentation    . I would like to keep my blood sugar at goal & maintain my A1c (pt-stated)       Pharmacist Clinical Goal(s):  Marland Kitchen Over the next 90 days, patient with work with PharmD and primary care provider to address disease state management of diabetes  Current Barriers:  . Diabetes: T2DM; A1c 6.2% on 01/05/20 . 01/05/20-met with patient in clinic-->Encouraged patient to continue exercise regimen and water intake; pill box given to aid in compliance with all medications . Current antihyperglycemic regimen: Trulicity 9.78YO once weekly (patient stable on this dose from a BG standpoint, however I would like patient to consider increase to 1.37m as patient could benefit from weight loss, will discuss with PCP) One Touch Verio glucometer/supplies . Denies hypoglycemic symptoms, including dizziness, lightheadedness, shaking, sweating . Denies hyperglycemic symptoms, including polyuria, polydipsia, polyphagia, nocturia, blurred vision, neuropathy . Current exercise: walking . Current blood glucose readings: FBG 100-120, reports FBG<130; notes that BG is higher when patient eats desserts,etc . Cardiovascular risk reduction: o Current hypertensive regimen: amlodipine 5 /benazepril 10, Atenolol 50/chlorthalidone 25 (patient states her BP used to be better controlled when she was on combination therapy.  Upon reviewed of pharmacy claims, patient remains on 2 combination medications for BP listed above.  Will remind patient that she remains on these two combination medications for BP (total of 4 medications targeted for BP).  Last BP in office was 130/74.  Patient encouraged to continue taking BP at home and report back. o Current hyperlipidemia regimen: rosuvastatin 20 mg daily  (getting 90 DS)  LDL 93 on 8/20 o Patient continues to take daily ASA o Patient interested in pill packs, pill box given today for compliance with HTN regiment.   CARE PLAN ENTRY (see longitudinal plan of care for additional care plan information)  Current Barriers:  .Marland KitchenKnowledge Deficits related to disease process and Self Health management of DM . Chronic Disease Management support and education needs related to DM, HTN, CKDIII  Nurse Case Manager Clinical Goal(s):  .Marland KitchenNew 02/17/20 Over the next 90 days, patient will work with CCM RN and PCP to address needs related to disease education and support to improve and or maintain Self Health management of DM and weight loss   CCM RN CM Interventions: 02/17/20 call completed with patient  . Inter-disciplinary care team collaboration (see longitudinal plan of care) . Evaluation of current treatment plan related to DM and patient's adherence to plan as established by provider. . Provided education to patient re: daily glycemic target ranges; FBS 80-130, <180 after meals: Determined patient is running within range . Reviewed medications with patient and discussed embedded Pharm recommendations for increasing Trulicity to 13.7CHfor benefits of weight loss: Determined patient is gradually losing weight and blood sugars are well controlled, she does not wish to increase Trulicity at this time . Discussed plans with patient for ongoing  care management follow up and provided patient with direct contact information for care management team . Provided patient with printed educational materials related to Diabetes Safety Zone Tool; Life's Simple 7  Patient Self Care Activities:  . Patient will check blood glucose daily (AM) , document, and provide at future appointments . Patient will focus on medication adherence by 100% medication adherence, improved BP control, blood glucose at goal range . Patient will take medications as prescribed . Patient will  contact provider with any episodes of hypoglycemia . Patient will report any questions or concerns to provider   Please see past updates related to this goal by clicking on the "Past Updates" button in the selected goal       Plan:   Telephone follow up appointment with care management team member scheduled for: 03/12/20  Barb Merino, RN, BSN, CCM Care Management Coordinator [place] [facility]  Direct Phone: [phone]

## 2020-03-12 ENCOUNTER — Telehealth: Payer: Self-pay

## 2020-03-18 ENCOUNTER — Telehealth: Payer: Self-pay | Admitting: Internal Medicine

## 2020-03-18 NOTE — Chronic Care Management (AMB) (Signed)
  Chronic Care Management   Note  03/18/2020 Name: [name] MRN: [id number] DOB: 08/20/1941  [name] is a 79 y.o. year old female who is a primary care patient of [name], MD and is actively engaged with the care management team. I reached out to [name] by phone today to assist with scheduling an initial visit with the Pharmacist  Follow up plan: Unsuccessful telephone outreach attempt made. A HIPPA compliant phone message was left for the patient providing contact information and requesting a return call. The care management team will reach out to the patient again over the next 7 days. If patient returns call to provider office, please advise to call [place] at [phone].  [place], [place] [address] Direct Dial: [phone] [email or web address]: [email or web address]

## 2020-03-22 ENCOUNTER — Ambulatory Visit (INDEPENDENT_AMBULATORY_CARE_PROVIDER_SITE_OTHER): Payer: Medicare Other | Admitting: Internal Medicine

## 2020-03-22 ENCOUNTER — Other Ambulatory Visit: Payer: Self-pay

## 2020-03-22 ENCOUNTER — Encounter: Payer: Self-pay | Admitting: Internal Medicine

## 2020-03-22 VITALS — BP 130/78 | HR 67 | Temp 98.0°F | Ht 66.2 in | Wt 247.8 lb

## 2020-03-22 DIAGNOSIS — E66812 Obesity, class 2: Secondary | ICD-10-CM

## 2020-03-22 DIAGNOSIS — M25551 Pain in right hip: Secondary | ICD-10-CM | POA: Diagnosis not present

## 2020-03-22 DIAGNOSIS — M2550 Pain in unspecified joint: Secondary | ICD-10-CM | POA: Diagnosis not present

## 2020-03-22 DIAGNOSIS — M25561 Pain in right knee: Secondary | ICD-10-CM | POA: Diagnosis not present

## 2020-03-22 DIAGNOSIS — M25552 Pain in left hip: Secondary | ICD-10-CM

## 2020-03-22 DIAGNOSIS — R413 Other amnesia: Secondary | ICD-10-CM

## 2020-03-22 DIAGNOSIS — Z6839 Body mass index (BMI) 39.0-39.9, adult: Secondary | ICD-10-CM

## 2020-03-22 DIAGNOSIS — H9201 Otalgia, right ear: Secondary | ICD-10-CM | POA: Diagnosis not present

## 2020-03-22 DIAGNOSIS — R946 Abnormal results of thyroid function studies: Secondary | ICD-10-CM | POA: Diagnosis not present

## 2020-03-23 ENCOUNTER — Encounter: Payer: Medicare Other | Admitting: Internal Medicine

## 2020-03-23 ENCOUNTER — Ambulatory Visit: Payer: Medicare Other

## 2020-03-24 LAB — URIC ACID: Uric Acid: 4.9 mg/dL (ref 3.1–7.9)

## 2020-03-24 LAB — RHEUMATOID FACTOR: Rheumatoid fact SerPl-aCnc: <10 IU/mL (ref 0.0–13.9)

## 2020-03-24 LAB — ANTINUCLEAR ANTIBODIES, IFA: ANA Titer 1: NEGATIVE

## 2020-03-24 LAB — CYCLIC CITRUL PEPTIDE ANTIBODY, IGG/IGA: Cyclic Citrullin Peptide Ab: 9 units (ref 0–19)

## 2020-03-24 LAB — VITAMIN B12: Vitamin B-12: 800 pg/mL (ref 232–1245)

## 2020-03-24 LAB — TSH: TSH: 3.45 u[IU]/mL (ref 0.450–4.500)

## 2020-03-24 LAB — SEDIMENTATION RATE: Sed Rate: 23 mm/hr (ref 0–40)

## 2020-03-24 LAB — RPR: RPR Ser Ql: NONREACTIVE

## 2020-03-25 LAB — SPECIMEN STATUS REPORT

## 2020-03-25 LAB — T4, FREE: Free T4: 1.05 ng/dL (ref 0.82–1.77)

## 2020-03-28 NOTE — Progress Notes (Signed)
This visit occurred during the SARS-CoV-2 public health emergency.  Safety protocols were in place, including screening questions prior to the visit, additional usage of staff PPE, and extensive cleaning of exam room while observing appropriate contact time as indicated for disinfecting solutions.  Subjective:     Patient ID: [name] , female    DOB: [birth year] , 79 y.o.   MRN: [id number]   Chief Complaint  Patient presents with  . Pain    hips right knee  . Otalgia    HPI  She is here today for further evaluation of joint pains. She reports she has generalized stiffness upon awakening. Has hip, and knee issues. She has taken Tylenol with minimal relief. Does not wish to take "strong" pain pills b/c they have caused her to hallucinate in the past.     Past Medical History:  Diagnosis Date  . Abdominal pain   . Arthritis    cervical disc degeneration/ oa left knee, carpal tunnel rt wrist, adhesive capsulitis right shoulder, rt hand weakness; lumbar degeneration  . Carpal tunnel syndrome   . 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 Constipation   . Diabetes mellitus without complication ([place])    borderline - diet control  . Diverticulitis hx of  . Diverticulosis   . E. coli infection    2020  . Frequent UTI    hx of urethral injury during colon surgery - states frequent uti's since  . GERD (gastroesophageal reflux disease)   . H/O hiatal hernia   . History of palpitations    in the past  . History of shingles    has a lingering itching on back where shingles were  . Hyperlipidemia   . Hypertension   . Numbness and tingling in right hand    pt. states has numbness of right hand very frequently-watch positioning  . Obesity   . Osteoarthritis   . Osteoporosis   . Pain    pain left knee and pain  right hip and right groin  . Personal history of colonic polyps-adenoma 08/26/2008  . Pneumonia 2005  . Vitamin D deficiency      Family History  Problem Relation Age of Onset  . Breast cancer Mother 34  . Hypertension Mother   . Prostate cancer Father   . Hypertension Father   . Dementia Sister   . Lung cancer Brother   . Hypertension Brother   . COPD Brother   . Cancer Maternal Grandmother   . Arthritis Sister   . Hypertension Sister   . Arthritis Sister   . Hypertension Child   . Hypertension Child   . Hypertension Child   . Colon polyps Neg Hx   . Esophageal cancer Neg Hx   . Rectal cancer Neg Hx   . Stomach cancer Neg Hx   . Colon cancer Neg Hx      Current Outpatient Medications:  .  allopurinol (ZYLOPRIM) 300 MG tablet, TAKE 1 TABLET(300 MG) BY MOUTH DAILY, Disp: 90 tablet, Rfl: 0 .  amLODipine-benazepril (LOTREL) 5-10 MG capsule, TAKE 1 CAPSULE BY MOUTH EVERY DAY, Disp: 90 capsule, Rfl: 0 .  aspirin EC 81 MG tablet, Take 1 tablet (81 mg total) by mouth daily., Disp: 90 tablet, Rfl: 0 .  atenolol-chlorthalidone (TENORETIC) 50-25 MG tablet, TAKE 1 TABLET BY  MOUTH EVERY DAY AT 11 AM, Disp: 90 tablet, Rfl: 0 .  Dulaglutide (TRULICITY) A999333 0000000 SOPN, INJECT 0.5 ML(0.75 MG) SUBCUTANEOUSLY EVERY WEEK IN ABDOMEN, THIGH OR UPPER ARM ROTATING INJECTION SITE, Disp: 2 mL, Rfl: 0 .  Multiple Vitamins-Minerals (ZINC PO), Take by mouth., Disp: , Rfl:  .  NON FORMULARY, Blood balance formula Ordered from Dr. Irena Cords 1 capsule daily., Disp: , Rfl:  .  ONETOUCH DELICA LANCETS 99991111 MISC, Use to check blood glucose once daily, Disp: 100 each, Rfl: 3 .  ONETOUCH VERIO test strip, USE AS DIRECTED TO TEST BLOOD SUGAR BID, Disp: , Rfl:  .  OVER THE COUNTER MEDICATION, OTC immune system booster daily, Disp: , Rfl:  .  pantoprazole (PROTONIX) 40 MG tablet, TAKE 1 TABLET(40 MG) BY MOUTH DAILY, Disp: 90 tablet, Rfl: 1 .  Probiotic Product (PROBIOTIC DAILY PO), Take 3 capsules by mouth daily.,  Disp: , Rfl:  .  rosuvastatin (CRESTOR) 20 MG tablet, TAKE 1 TABLET(20 MG) BY MOUTH DAILY, Disp: 90 tablet, Rfl: 0 .  Diclofenac Sodium (PENNSAID) 2 % SOLN, Apply 10 drops topically 2 (two) times daily as needed. (Patient not taking: Reported on 12/31/2019), Disp: 112 g, Rfl: 1   Allergies  Allergen Reactions  . Atorvastatin Calcium Itching    Other reaction(s): Itching  . Bee Venom Hives  . Lipitor [Atorvastatin Calcium] Itching  . Oxycodone Other (See Comments)    hallucinations  . Vicodin [Hydrocodone-Acetaminophen] Other (See Comments)    hallucinations     Review of Systems  Constitutional: Negative.   HENT: Positive for ear pain.        States her right ear feels congested. Denies discharge, fever, chills. No hearing deficit.   Respiratory: Negative.   Cardiovascular: Negative.   Gastrointestinal: Negative.   Musculoskeletal: Positive for arthralgias.  Neurological: Negative.        She c/o memory loss. Not sure what has triggered her sx. Admits that she under a lot of stress. 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   Psychiatric/Behavioral: Negative.      Today's Vitals   03/22/20 1423  BP: 130/78  Pulse: 67  Temp: 98 F (36.7 C)  TempSrc: Oral  Weight: 247 lb 12.8 oz (112.4 kg)  Height: 5' 6.2" (1.681 m)   Body mass index is 39.75 kg/m.   Objective:  Physical Exam Vitals and nursing note reviewed.  Constitutional:      Appearance: Normal appearance.  HENT:     Head: Normocephalic and atraumatic.     Right Ear: Tympanic membrane, ear canal and external ear normal.     Left Ear: Tympanic membrane, ear canal and external ear normal.  Cardiovascular:     Rate and Rhythm: Normal rate and regular rhythm.     Heart sounds: Normal heart sounds.  Pulmonary:     Effort: Pulmonary effort is normal.     Breath sounds: Normal breath sounds.  Skin:    General: Skin is warm.  Neurological:     General: No focal deficit present.     Mental Status: She  is alert.  Psychiatric:        Mood and Affect: Mood normal.        Behavior: Behavior normal.         Assessment And Plan:     1. Bilateral hip pain  Chronic. I will refer her to Ortho as requested. She is encouraged to follow an anti-inflammatory diet.   2. Recurrent pain of  right knee  Pt is agreeable to Ortho referral. Pt also advised that she may benefit from orthotics. She is agreeable to Podiatry referral.   3. Arthralgia, unspecified joint  Chronic. I will check labs as listed below and adjust meds as needed.  She is encouraged to avoid sugary beverages and processed foods.   - ANA, IFA (with reflex) - CYCLIC CITRUL PEPTIDE ANTIBODY, IGG/IGA - Rheumatoid factor - Sedimentation rate - Uric acid  4. Otalgia of right ear  No abnormalities found on exam of ears.   5. Memory loss  I will check labs as listed below.  I will make further recommendations once her labs are available for review.   - Vitamin B12 - TSH - RPR  6. Class 2 severe obesity due to excess calories with serious comorbidity and body mass index (BMI) of 39.0 to 39.9 in adult [facility])  She is encouraged to initially strive for BMI less than 34 to decrease cardiac risk. Advised to incorporate more exercise into her daily routine.   Maximino Greenland, MD    THE PATIENT IS ENCOURAGED TO PRACTICE SOCIAL DISTANCING DUE TO THE COVID-19 PANDEMIC.

## 2020-03-30 NOTE — Chronic Care Management (AMB) (Signed)
  Chronic Care Management   Note  03/30/2020 Name: [name] MRN: [id number] DOB: 1941-08-30  [name] is a 79 y.o. year old female who is a primary care patient of [name], MD and is actively engaged with the care management team. I reached out to [name] by phone today to assist with scheduling an initial visit with the Pharmacist.  Follow up plan: A second unsuccessful telephone outreach attempt made. A HIPPA compliant phone message was left for the patient providing contact information and requesting a return call. The care management team will reach out to the patient again over the next 7 days. If patient returns call to provider office, please advise to call [place] at [phone].  [place], [place] [address] Direct Dial: [phone] [email or web address]

## 2020-04-01 ENCOUNTER — Encounter: Payer: Medicare Other | Admitting: Internal Medicine

## 2020-04-01 ENCOUNTER — Ambulatory Visit: Payer: Medicare Other

## 2020-04-05 NOTE — Chronic Care Management (AMB) (Signed)
  Chronic Care Management   Note  04/05/2020 Name: Ameliyah Sarno MRN: [id number] DOB: June 11, 1941  Jamelia Varano is a 79 y.o. year old female who is a primary care patient of [name], MD and is actively engaged with the care management team. I reached out to [name] by phone today to assist with scheduling an initial visit with the Pharmacist.  Follow up plan: Telephone appointment with care management team member scheduled for:05/10/2020  [place], [place] Management  [place], [place] [address] Direct Dial: [email or web address]

## 2020-04-08 ENCOUNTER — Other Ambulatory Visit: Payer: Self-pay | Admitting: Internal Medicine

## 2020-04-13 DIAGNOSIS — H25812 Combined forms of age-related cataract, left eye: Secondary | ICD-10-CM | POA: Diagnosis not present

## 2020-04-13 DIAGNOSIS — H26491 Other secondary cataract, right eye: Secondary | ICD-10-CM | POA: Diagnosis not present

## 2020-04-13 DIAGNOSIS — Z961 Presence of intraocular lens: Secondary | ICD-10-CM | POA: Diagnosis not present

## 2020-04-13 DIAGNOSIS — E119 Type 2 diabetes mellitus without complications: Secondary | ICD-10-CM | POA: Diagnosis not present

## 2020-04-14 ENCOUNTER — Other Ambulatory Visit: Payer: Self-pay

## 2020-04-14 ENCOUNTER — Telehealth: Payer: Self-pay

## 2020-04-14 ENCOUNTER — Ambulatory Visit: Payer: Self-pay

## 2020-04-14 DIAGNOSIS — N183 Chronic kidney disease, stage 3 unspecified: Secondary | ICD-10-CM

## 2020-04-14 DIAGNOSIS — I10 Essential (primary) hypertension: Secondary | ICD-10-CM

## 2020-04-14 DIAGNOSIS — E1122 Type 2 diabetes mellitus with diabetic chronic kidney disease: Secondary | ICD-10-CM

## 2020-04-15 NOTE — Chronic Care Management (AMB) (Signed)
  Chronic Care Management   Outreach Note  04/15/2020 Name: Safira Proffit MRN: [id number] DOB: [birth year]  Referred by: [name], MD Reason for referral : Chronic Care Management ([facility] RN Call - DM/Grief/HLD )   An unsuccessful telephone outreach was attempted today. The patient was referred to the case management team for assistance with care management and care coordination.   Follow Up Plan: Telephone follow up appointment with care management team member scheduled for: 05/21/20  Barb Merino, RN, BSN, CCM Care Management Coordinator [place] [facility]  Direct Phone: [phone]

## 2020-05-04 ENCOUNTER — Other Ambulatory Visit: Payer: Self-pay

## 2020-05-04 ENCOUNTER — Encounter: Payer: Self-pay | Admitting: Nurse Practitioner

## 2020-05-04 ENCOUNTER — Ambulatory Visit (INDEPENDENT_AMBULATORY_CARE_PROVIDER_SITE_OTHER): Payer: Medicare Other | Admitting: Nurse Practitioner

## 2020-05-04 VITALS — BP 130/74 | HR 76 | Temp 98.3°F | Ht 66.2 in | Wt 248.8 lb

## 2020-05-04 DIAGNOSIS — R300 Dysuria: Secondary | ICD-10-CM | POA: Diagnosis not present

## 2020-05-04 DIAGNOSIS — Z7982 Long term (current) use of aspirin: Secondary | ICD-10-CM | POA: Diagnosis not present

## 2020-05-04 DIAGNOSIS — N390 Urinary tract infection, site not specified: Secondary | ICD-10-CM

## 2020-05-04 DIAGNOSIS — R350 Frequency of micturition: Secondary | ICD-10-CM

## 2020-05-04 LAB — POCT URINALYSIS DIPSTICK
Bilirubin, UA: NEGATIVE
Glucose, UA: NEGATIVE
Ketones, UA: NEGATIVE
Nitrite, UA: POSITIVE
Protein, UA: NEGATIVE
Spec Grav, UA: 1.025 (ref 1.010–1.025)
Urobilinogen, UA: 1 E.U./dL
pH, UA: 5.5 (ref 5.0–8.0)

## 2020-05-04 MED ORDER — NITROFURANTOIN MONOHYD MACRO 100 MG PO CAPS: 100.0000 mg | ORAL_CAPSULE | Freq: Two times a day (BID) | ORAL | 0 refills | Status: DC

## 2020-05-04 MED ORDER — CEFTRIAXONE SODIUM 500 MG IJ SOLR
500.0000 mg | Freq: Once | INTRAMUSCULAR | Status: AC
  Administered 2020-05-04: 500 mg via INTRAMUSCULAR

## 2020-05-04 NOTE — Progress Notes (Signed)
This visit occurred during the SARS-CoV-2 public health emergency.  Safety protocols were in place, including screening questions prior to the visit, additional usage of staff PPE, and extensive cleaning of exam room while observing appropriate contact time as indicated for disinfecting solutions.  Subjective:     Patient ID: [name] , female    DOB: [birth year] , 79 y.o.   MRN: [id number]   Chief Complaint  Patient presents with  . Urinary Tract Infection    HPI  She is using a hand towel to hold the water.   Urinary Tract Infection  This is a new problem. The current episode started in the past 7 days. The problem occurs every urination. The quality of the pain is described as burning. There has been no fever. She is sexually active. There is no history of pyelonephritis. Associated symptoms include flank pain and frequency. Pertinent negatives include no discharge, nausea or urgency. She has tried increased fluids for the symptoms. Her past medical history is significant for recurrent UTIs.     Past Medical History:  Diagnosis Date  . Abdominal pain   . Arthritis    cervical disc degeneration/ oa left knee, carpal tunnel rt wrist, adhesive capsulitis right shoulder, rt hand weakness; lumbar degeneration  . Carpal tunnel syndrome   . 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 Constipation   . Diabetes mellitus without complication ([place])    borderline - diet control  . Diverticulitis hx of  . Diverticulosis   . E. coli infection    2020  . Frequent UTI    hx of urethral injury during colon surgery - states frequent uti's since  . GERD (gastroesophageal reflux disease)   . H/O hiatal hernia   . History of palpitations    in the past  . History of shingles    has a lingering itching on back where  shingles were  . Hyperlipidemia   . Hypertension   . Numbness and tingling in right hand    pt. states has numbness of right hand very frequently-watch positioning  . Obesity   . Osteoarthritis   . Osteoporosis   . Pain    pain left knee and pain right hip and right groin  . Personal history of colonic polyps-adenoma 08/26/2008  . Pneumonia 2005  . Vitamin D deficiency      Family History  Problem Relation Age of Onset  . Breast cancer Mother 46  . Hypertension Mother   . Prostate cancer Father   . Hypertension Father   . Dementia Sister   . Lung cancer Brother   . Hypertension Brother   . COPD Brother   . Cancer Maternal Grandmother   . Arthritis Sister   . Hypertension Sister   . Arthritis Sister   . Hypertension Child   . Hypertension Child   . Hypertension Child   . Colon polyps Neg Hx   . Esophageal cancer Neg Hx   . Rectal cancer Neg Hx   . Stomach cancer Neg Hx   . Colon cancer Neg Hx      Current Outpatient Medications:  .  allopurinol (ZYLOPRIM) 300 MG tablet, TAKE 1 TABLET(300 MG) BY MOUTH DAILY, Disp: 90 tablet, Rfl: 0 .  amLODipine-benazepril (LOTREL) 5-10 MG capsule, TAKE 1 CAPSULE BY MOUTH EVERY DAY, Disp:  90 capsule, Rfl: 0 .  aspirin EC 81 MG tablet, Take 1 tablet (81 mg total) by mouth daily., Disp: 90 tablet, Rfl: 0 .  atenolol-chlorthalidone (TENORETIC) 50-25 MG tablet, TAKE 1 TABLET BY MOUTH EVERY DAY AT 11 AM, Disp: 90 tablet, Rfl: 0 .  Dulaglutide (TRULICITY) 6.04 VW/0.9WJ SOPN, INJECT 0.5 ML(0.75 MG) SUBCUTANEOUSLY EVERY WEEK IN ABDOMEN, THIGH OR UPPER ARM ROTATING INJECTION SITE, Disp: 2 mL, Rfl: 0 .  ONETOUCH DELICA LANCETS 19J MISC, Use to check blood glucose once daily, Disp: 100 each, Rfl: 3 .  ONETOUCH VERIO test strip, USE AS DIRECTED TO TEST BLOOD SUGAR BID, Disp: , Rfl:  .  pantoprazole (PROTONIX) 40 MG tablet, TAKE 1 TABLET(40 MG) BY MOUTH DAILY, Disp: 90 tablet, Rfl: 1 .  rosuvastatin (CRESTOR) 20 MG tablet, TAKE 1 TABLET(20 MG) BY  MOUTH DAILY, Disp: 90 tablet, Rfl: 0 .  Multiple Vitamins-Minerals (ZINC PO), Take by mouth. (Patient not taking: Reported on 05/04/2020), Disp: , Rfl:  .  NON FORMULARY, Blood balance formula Ordered from Dr. Irena Cords 1 capsule daily. (Patient not taking: Reported on 05/04/2020), Disp: , Rfl:  .  OVER THE COUNTER MEDICATION, OTC immune system booster daily (Patient not taking: Reported on 05/04/2020), Disp: , Rfl:  .  Probiotic Product (PROBIOTIC DAILY PO), Take 3 capsules by mouth daily. (Patient not taking: Reported on 05/04/2020), Disp: , Rfl:    Allergies  Allergen Reactions  . Atorvastatin Calcium Itching    Other reaction(s): Itching  . Bee Venom Hives  . Lipitor [Atorvastatin Calcium] Itching  . Oxycodone Other (See Comments)    hallucinations  . Vicodin [Hydrocodone-Acetaminophen] Other (See Comments)    hallucinations     Review of Systems  Constitutional: Negative.   Respiratory: Negative.   Cardiovascular: Negative.  Negative for chest pain, palpitations and leg swelling.  Gastrointestinal: Negative for nausea.  Genitourinary: Positive for flank pain and frequency. Negative for urgency.  Neurological: Negative for dizziness and headaches.  Psychiatric/Behavioral: Negative.      Today's Vitals   05/04/20 1502  BP: 130/74  Pulse: 76  Temp: 98.3 F (36.8 C)  TempSrc: Oral  Weight: 248 lb 12.8 oz (112.9 kg)  Height: 5' 6.2" (1.681 m)   Body mass index is 39.92 kg/m.   Objective:  Physical Exam Constitutional:      General: She is not in acute distress.    Appearance: Normal appearance.  Cardiovascular:     Rate and Rhythm: Normal rate and regular rhythm.     Pulses: Normal pulses.     Heart sounds: Normal heart sounds. No murmur heard.   Pulmonary:     Effort: Pulmonary effort is normal. No respiratory distress.     Breath sounds: Normal breath sounds.  Abdominal:     Tenderness: There is no right CVA tenderness or left CVA tenderness.  Skin:    General: Skin is  warm and dry.     Capillary Refill: Capillary refill takes less than 2 seconds.  Neurological:     General: No focal deficit present.     Mental Status: She is alert and oriented to person, place, and time.     Cranial Nerves: No cranial nerve deficit.  Psychiatric:        Mood and Affect: Mood normal.        Behavior: Behavior normal.        Thought Content: Thought content normal.        Judgment: Judgment normal.  Assessment And Plan:   1. Burning with urination  Positive nitrates in urine - POCT Urinalysis Dipstick (81002) - Culture, Urine - cefTRIAXone (ROCEPHIN) injection 500 mg - nitrofurantoin, macrocrystal-monohydrate, (MACROBID) 100 MG capsule; Take 1 capsule (100 mg total) by mouth 2 (two) times daily for 5 days.  Dispense: 10 capsule; Refill: 0  2. Urinary tract infection without hematuria, site unspecified  Positive nitrates in urine  Will treat with ceftriaxone in office and she is to take her macrobid starting tomorrow  Increase water intake and cranberry juice.  - cefTRIAXone (ROCEPHIN) injection 500 mg - nitrofurantoin, macrocrystal-monohydrate, (MACROBID) 100 MG capsule; Take 1 capsule (100 mg total) by mouth 2 (two) times daily for 5 days.  Dispense: 10 capsule; Refill: 0  3. Urinary frequency  Likely related to her history of urological surgery  Will consider starting oxybutynin once results of culture come back to avoid starting several medications at once.   [name], FNP    THE PATIENT IS ENCOURAGED TO PRACTICE SOCIAL DISTANCING DUE TO THE COVID-19 PANDEMIC.

## 2020-05-06 ENCOUNTER — Inpatient Hospital Stay (HOSPITAL_COMMUNITY)
Admission: EM | Admit: 2020-05-06 | Discharge: 2020-05-08 | DRG: 871 | Disposition: A | Discharge status: Home Health | Payer: Medicare Other | Attending: Family Medicine | Admitting: Family Medicine

## 2020-05-06 ENCOUNTER — Encounter (HOSPITAL_COMMUNITY): Payer: Self-pay

## 2020-05-06 ENCOUNTER — Other Ambulatory Visit: Payer: Self-pay

## 2020-05-06 ENCOUNTER — Emergency Department (HOSPITAL_COMMUNITY): Payer: Medicare Other

## 2020-05-06 DIAGNOSIS — N390 Urinary tract infection, site not specified: Secondary | ICD-10-CM | POA: Diagnosis present

## 2020-05-06 DIAGNOSIS — Z803 Family history of malignant neoplasm of breast: Secondary | ICD-10-CM

## 2020-05-06 DIAGNOSIS — I447 Left bundle-branch block, unspecified: Secondary | ICD-10-CM | POA: Diagnosis not present

## 2020-05-06 DIAGNOSIS — E1169 Type 2 diabetes mellitus with other specified complication: Secondary | ICD-10-CM

## 2020-05-06 DIAGNOSIS — Z6835 Body mass index (BMI) 35.0-35.9, adult: Secondary | ICD-10-CM

## 2020-05-06 DIAGNOSIS — E119 Type 2 diabetes mellitus without complications: Secondary | ICD-10-CM | POA: Diagnosis present

## 2020-05-06 DIAGNOSIS — N183 Chronic kidney disease, stage 3 unspecified: Secondary | ICD-10-CM

## 2020-05-06 DIAGNOSIS — Z20822 Contact with and (suspected) exposure to covid-19: Secondary | ICD-10-CM | POA: Diagnosis present

## 2020-05-06 DIAGNOSIS — Z7982 Long term (current) use of aspirin: Secondary | ICD-10-CM

## 2020-05-06 DIAGNOSIS — R531 Weakness: Secondary | ICD-10-CM | POA: Diagnosis not present

## 2020-05-06 DIAGNOSIS — Z8042 Family history of malignant neoplasm of prostate: Secondary | ICD-10-CM | POA: Diagnosis not present

## 2020-05-06 DIAGNOSIS — R509 Fever, unspecified: Secondary | ICD-10-CM | POA: Diagnosis not present

## 2020-05-06 DIAGNOSIS — Z825 Family history of asthma and other chronic lower respiratory diseases: Secondary | ICD-10-CM | POA: Diagnosis not present

## 2020-05-06 DIAGNOSIS — Z801 Family history of malignant neoplasm of trachea, bronchus and lung: Secondary | ICD-10-CM

## 2020-05-06 DIAGNOSIS — K219 Gastro-esophageal reflux disease without esophagitis: Secondary | ICD-10-CM | POA: Diagnosis not present

## 2020-05-06 DIAGNOSIS — I152 Hypertension secondary to endocrine disorders: Secondary | ICD-10-CM | POA: Diagnosis present

## 2020-05-06 DIAGNOSIS — J189 Pneumonia, unspecified organism: Secondary | ICD-10-CM | POA: Diagnosis not present

## 2020-05-06 DIAGNOSIS — J9811 Atelectasis: Secondary | ICD-10-CM | POA: Diagnosis not present

## 2020-05-06 DIAGNOSIS — E876 Hypokalemia: Secondary | ICD-10-CM | POA: Diagnosis present

## 2020-05-06 DIAGNOSIS — M109 Gout, unspecified: Secondary | ICD-10-CM | POA: Diagnosis not present

## 2020-05-06 DIAGNOSIS — Z87440 Personal history of urinary (tract) infections: Secondary | ICD-10-CM

## 2020-05-06 DIAGNOSIS — A419 Sepsis, unspecified organism: Principal | ICD-10-CM | POA: Diagnosis present

## 2020-05-06 DIAGNOSIS — Z96653 Presence of artificial knee joint, bilateral: Secondary | ICD-10-CM | POA: Diagnosis present

## 2020-05-06 DIAGNOSIS — Z8249 Family history of ischemic heart disease and other diseases of the circulatory system: Secondary | ICD-10-CM

## 2020-05-06 DIAGNOSIS — Z8261 Family history of arthritis: Secondary | ICD-10-CM

## 2020-05-06 DIAGNOSIS — E785 Hyperlipidemia, unspecified: Secondary | ICD-10-CM | POA: Diagnosis not present

## 2020-05-06 DIAGNOSIS — I10 Essential (primary) hypertension: Secondary | ICD-10-CM | POA: Diagnosis present

## 2020-05-06 DIAGNOSIS — Z79899 Other long term (current) drug therapy: Secondary | ICD-10-CM | POA: Diagnosis not present

## 2020-05-06 DIAGNOSIS — E1122 Type 2 diabetes mellitus with diabetic chronic kidney disease: Secondary | ICD-10-CM

## 2020-05-06 HISTORY — DX: Pneumonia, unspecified organism: J18.9

## 2020-05-06 LAB — URINALYSIS, ROUTINE W REFLEX MICROSCOPIC
Bilirubin Urine: NEGATIVE
Glucose, UA: NEGATIVE mg/dL
Hgb urine dipstick: NEGATIVE
Ketones, ur: NEGATIVE mg/dL
Leukocytes,Ua: NEGATIVE
Nitrite: NEGATIVE
Protein, ur: NEGATIVE mg/dL
Specific Gravity, Urine: 1.011 (ref 1.005–1.030)
pH: 5 (ref 5.0–8.0)

## 2020-05-06 LAB — CBC WITH DIFFERENTIAL/PLATELET
Abs Immature Granulocytes: 0.16 10*3/uL — ABNORMAL HIGH (ref 0.00–0.07)
Basophils Absolute: 0 10*3/uL (ref 0.0–0.1)
Basophils Relative: 0 %
Eosinophils Absolute: 0.1 10*3/uL (ref 0.0–0.5)
Eosinophils Relative: 1 %
HCT: 42.7 % (ref 36.0–46.0)
Hemoglobin: 14.4 g/dL (ref 12.0–15.0)
Immature Granulocytes: 1 %
Lymphocytes Relative: 2 %
Lymphs Abs: 0.3 10*3/uL — ABNORMAL LOW (ref 0.7–4.0)
MCH: 30.9 pg (ref 26.0–34.0)
MCHC: 33.7 g/dL (ref 30.0–36.0)
MCV: 91.6 fL (ref 80.0–100.0)
Monocytes Absolute: 0.5 10*3/uL (ref 0.1–1.0)
Monocytes Relative: 3 %
Neutro Abs: 14.4 10*3/uL — ABNORMAL HIGH (ref 1.7–7.7)
Neutrophils Relative %: 93 %
Platelets: 194 10*3/uL (ref 150–400)
RBC: 4.66 MIL/uL (ref 3.87–5.11)
RDW: 13.8 % (ref 11.5–15.5)
WBC: 15.5 10*3/uL — ABNORMAL HIGH (ref 4.0–10.5)
nRBC: 0 % (ref 0.0–0.2)

## 2020-05-06 LAB — COMPREHENSIVE METABOLIC PANEL
ALT: 31 U/L (ref 0–44)
AST: 41 U/L (ref 15–41)
Albumin: 3.8 g/dL (ref 3.5–5.0)
Alkaline Phosphatase: 47 U/L (ref 38–126)
Anion gap: 15 (ref 5–15)
BUN: 22 mg/dL (ref 8–23)
CO2: 22 mmol/L (ref 22–32)
Calcium: 9 mg/dL (ref 8.9–10.3)
Chloride: 100 mmol/L (ref 98–111)
Creatinine, Ser: 1.13 mg/dL — ABNORMAL HIGH (ref 0.44–1.00)
GFR calc Af Amer: 54 mL/min — ABNORMAL LOW (ref >60)
GFR calc non Af Amer: 47 mL/min — ABNORMAL LOW (ref >60)
Glucose, Bld: 173 mg/dL — ABNORMAL HIGH (ref 70–99)
Potassium: 3.3 mmol/L — ABNORMAL LOW (ref 3.5–5.1)
Sodium: 137 mmol/L (ref 135–145)
Total Bilirubin: 1.4 mg/dL — ABNORMAL HIGH (ref 0.3–1.2)
Total Protein: 7 g/dL (ref 6.5–8.1)

## 2020-05-06 LAB — GLUCOSE, CAPILLARY
Glucose-Capillary: 120 mg/dL — ABNORMAL HIGH (ref 70–99)
Glucose-Capillary: 118 mg/dL — ABNORMAL HIGH (ref 70–99)

## 2020-05-06 LAB — SARS CORONAVIRUS 2 BY RT PCR (HOSPITAL ORDER, PERFORMED IN ~~LOC~~ HOSPITAL LAB): SARS Coronavirus 2: NEGATIVE

## 2020-05-06 LAB — HEMOGLOBIN A1C
Hgb A1c MFr Bld: 6.7 % — ABNORMAL HIGH (ref 4.8–5.6)
Mean Plasma Glucose: 145.59 mg/dL

## 2020-05-06 LAB — CBG MONITORING, ED: Glucose-Capillary: 145 mg/dL — ABNORMAL HIGH (ref 70–99)

## 2020-05-06 LAB — MAGNESIUM: Magnesium: 1.5 mg/dL — ABNORMAL LOW (ref 1.7–2.4)

## 2020-05-06 LAB — LACTIC ACID, PLASMA: Lactic Acid, Venous: 2.5 mmol/L (ref 0.5–1.9)

## 2020-05-06 IMAGING — CR DG CHEST 2V
2 series · 2 of 2 positions shown · non-contrast
Comparison: [DATE].

CLINICAL DATA: Fever.

EXAM:
CHEST - 2 VIEW

[w chest lat]
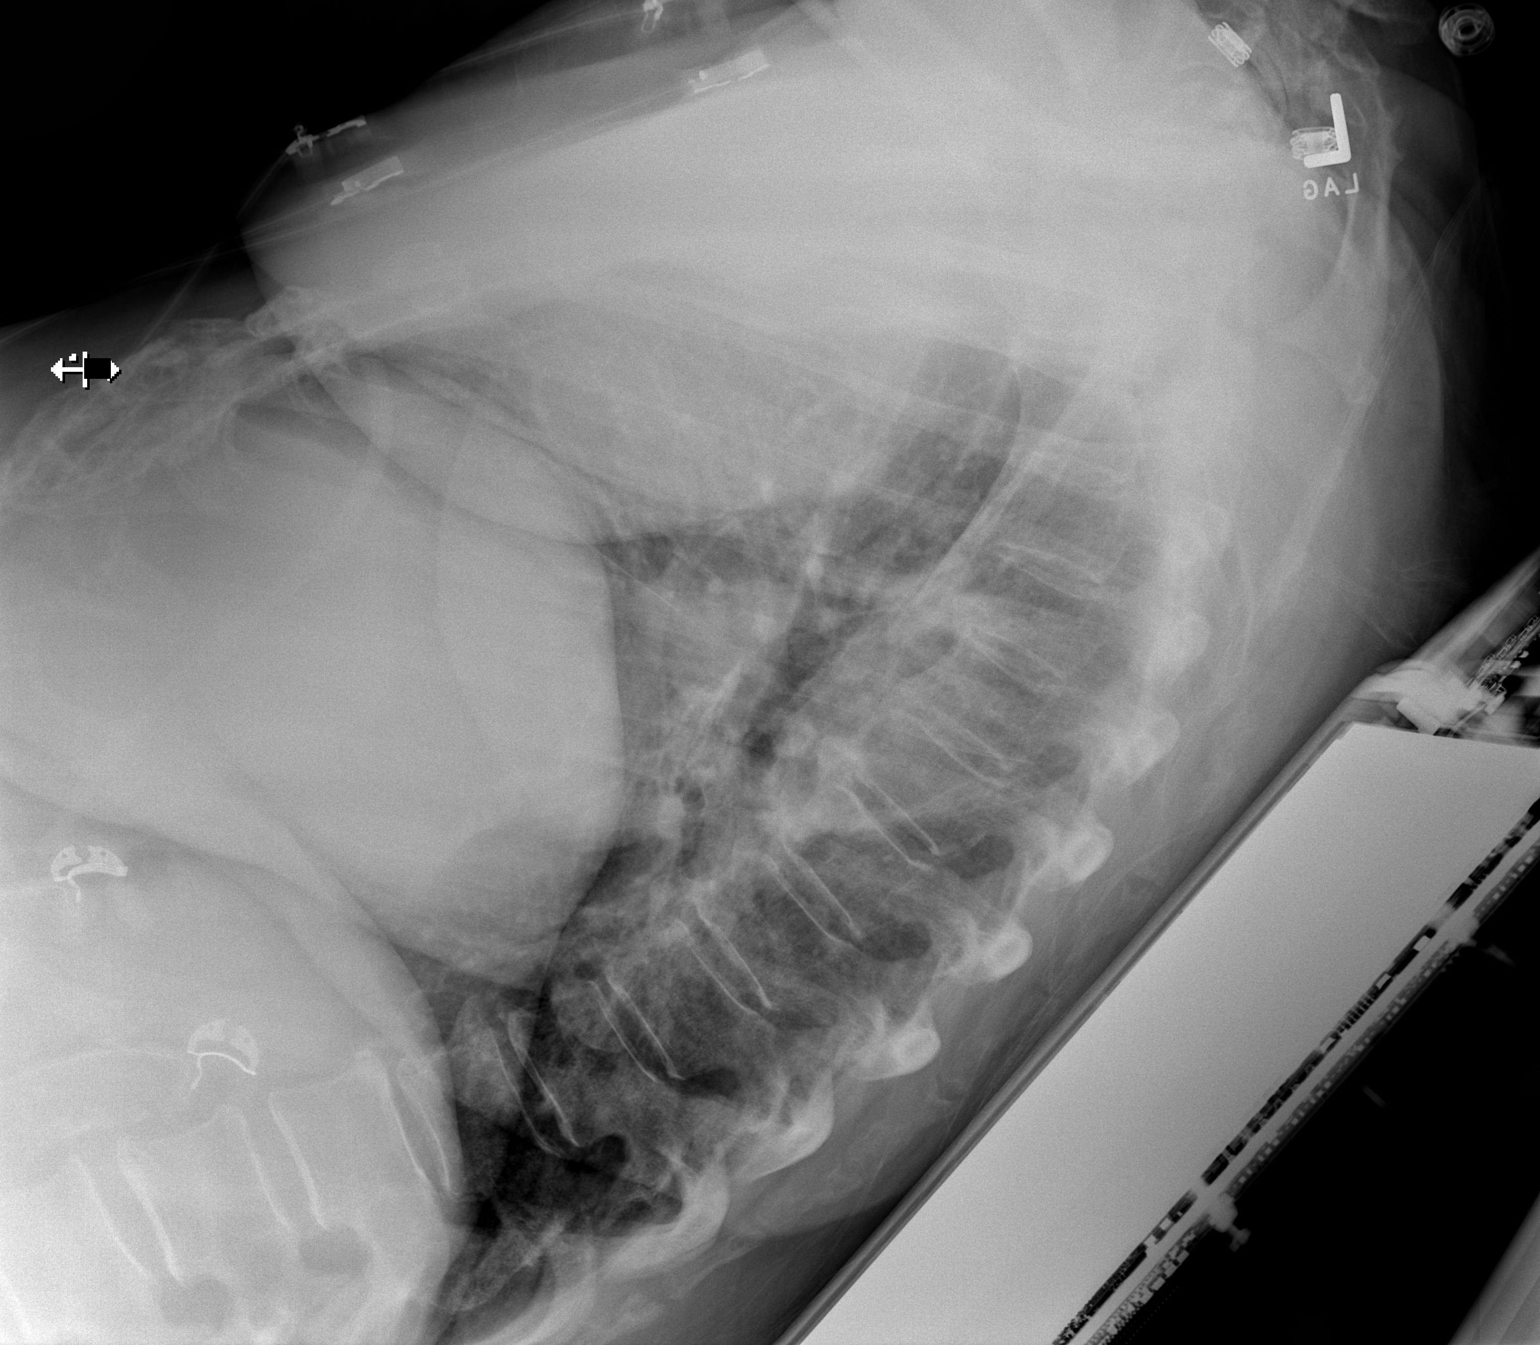

[x chest ap]
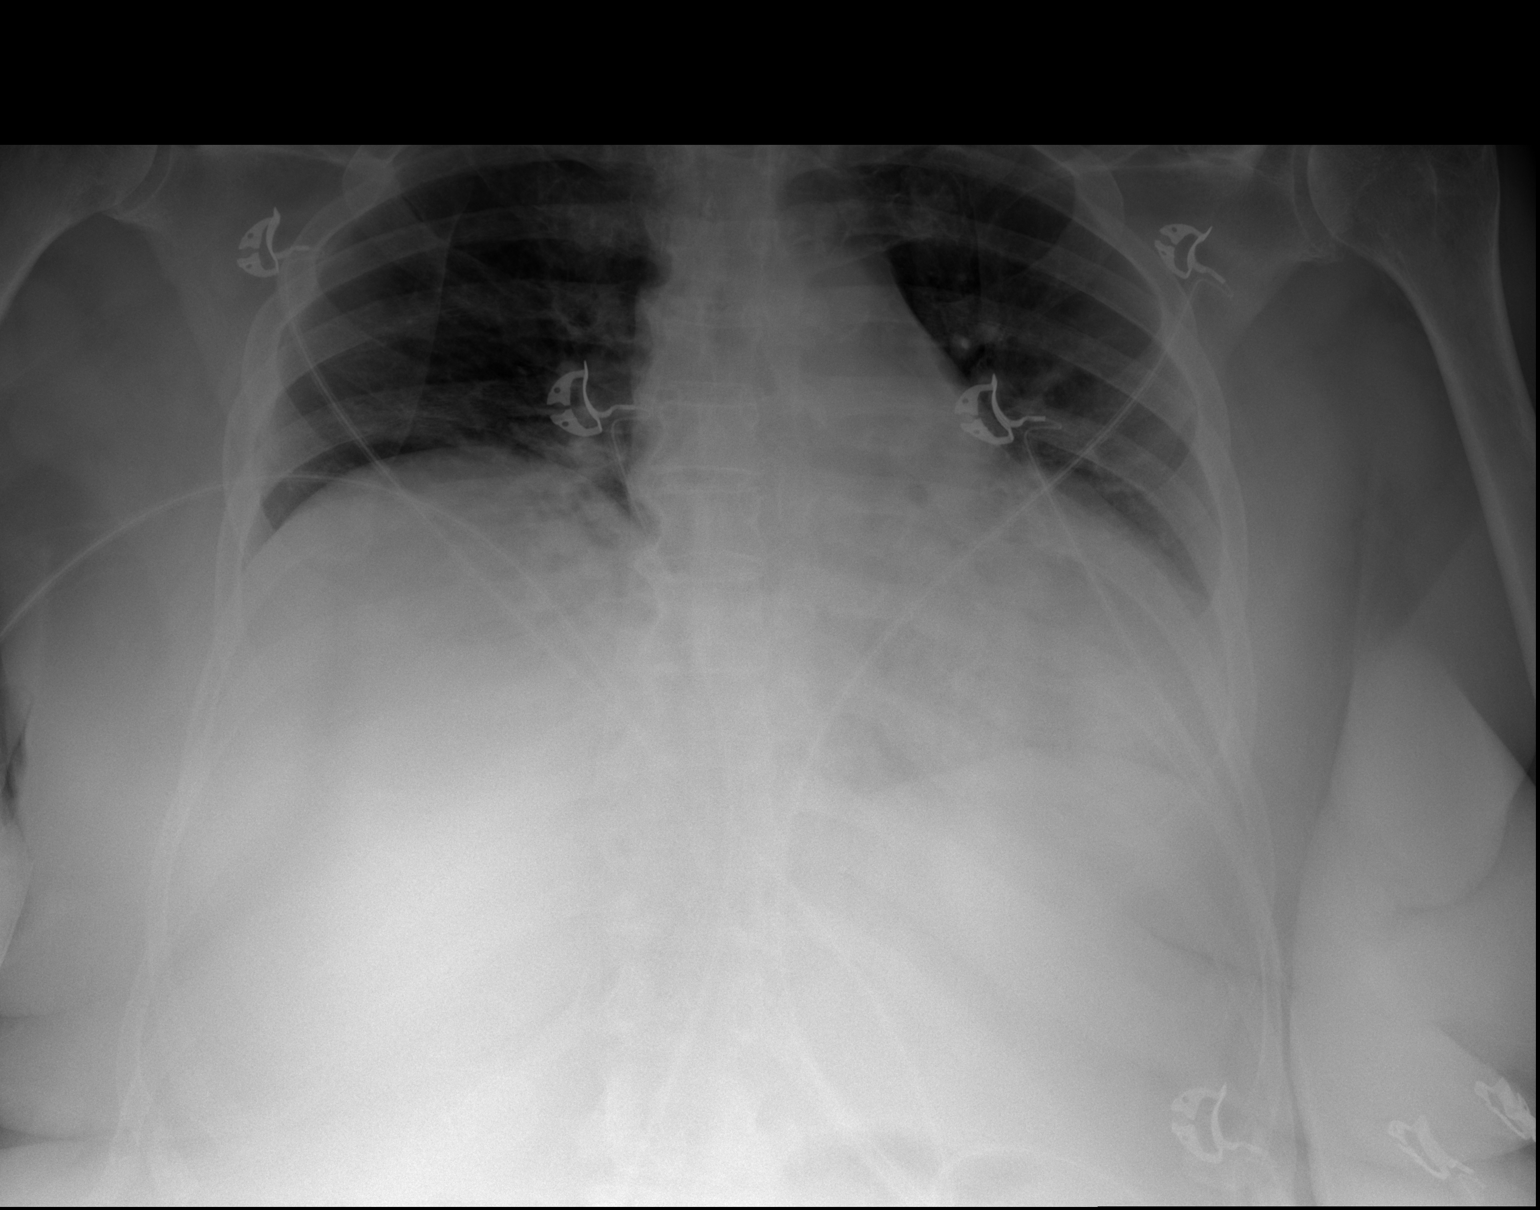

[2 of 2 positions shown; findings below may reference images not displayed]

FINDINGS: Mediastinum and hilar structures normal. Left base infiltrate cannot
be excluded. Stable elevation right hemidiaphragm with mild right
base subsegmental atelectasis again noted. No pleural effusion or
pneumothorax. Heart size stable. No pulmonary venous congestion.
IMPRESSION: 1.  Left base infiltrate cannot be excluded.

2. Stable elevation right hemidiaphragm with right base subsegmental
atelectasis.

## 2020-05-06 MED ORDER — ACETAMINOPHEN 325 MG PO TABS
650.0000 mg | ORAL_TABLET | Freq: Once | ORAL | Status: AC
  Administered 2020-05-06: 650 mg via ORAL
  Filled 2020-05-06: qty 2

## 2020-05-06 MED ORDER — POLYETHYLENE GLYCOL 3350 17 G PO PACK: 17.0000 g | PACK | Freq: Every day | ORAL | Status: DC | PRN

## 2020-05-06 MED ORDER — ATENOLOL 50 MG PO TABS
50.0000 mg | ORAL_TABLET | Freq: Every day | ORAL | Status: DC
  Administered 2020-05-06: 50 mg via ORAL
  Filled 2020-05-06 (×2): qty 1

## 2020-05-06 MED ORDER — SODIUM CHLORIDE 0.9% FLUSH: 3.0000 mL | Freq: Once | INTRAVENOUS | Status: DC

## 2020-05-06 MED ORDER — LACTATED RINGERS IV SOLN: INTRAVENOUS | Status: DC

## 2020-05-06 MED ORDER — POTASSIUM CHLORIDE CRYS ER 20 MEQ PO TBCR: 40.0000 meq | EXTENDED_RELEASE_TABLET | Freq: Once | ORAL | Status: DC

## 2020-05-06 MED ORDER — ASPIRIN EC 81 MG PO TBEC
81.0000 mg | DELAYED_RELEASE_TABLET | Freq: Every day | ORAL | Status: DC
  Administered 2020-05-07 – 2020-05-08 (×2): 81 mg via ORAL
  Filled 2020-05-06 (×2): qty 1

## 2020-05-06 MED ORDER — AMLODIPINE BESYLATE 5 MG PO TABS
5.0000 mg | ORAL_TABLET | Freq: Every day | ORAL | Status: DC
  Administered 2020-05-06: 5 mg via ORAL
  Filled 2020-05-06: qty 1

## 2020-05-06 MED ORDER — ENOXAPARIN SODIUM 40 MG/0.4ML ~~LOC~~ SOLN
40.0000 mg | SUBCUTANEOUS | Status: DC
  Administered 2020-05-06: 40 mg via SUBCUTANEOUS
  Filled 2020-05-06: qty 0.4

## 2020-05-06 MED ORDER — CHLORTHALIDONE 25 MG PO TABS
25.0000 mg | ORAL_TABLET | Freq: Every day | ORAL | Status: DC
  Administered 2020-05-06: 25 mg via ORAL
  Filled 2020-05-06 (×2): qty 1

## 2020-05-06 MED ORDER — AMLODIPINE BESY-BENAZEPRIL HCL 5-10 MG PO CAPS: 1.0000 | ORAL_CAPSULE | Freq: Every day | ORAL | Status: DC

## 2020-05-06 MED ORDER — ALLOPURINOL 300 MG PO TABS
300.0000 mg | ORAL_TABLET | Freq: Every day | ORAL | Status: DC
  Administered 2020-05-07 – 2020-05-08 (×2): 300 mg via ORAL
  Filled 2020-05-06 (×2): qty 1

## 2020-05-06 MED ORDER — MAGNESIUM SULFATE 2 GM/50ML IV SOLN
2.0000 g | Freq: Once | INTRAVENOUS | Status: AC
  Administered 2020-05-06: 2 g via INTRAVENOUS
  Filled 2020-05-06: qty 50

## 2020-05-06 MED ORDER — ACETAMINOPHEN 325 MG PO TABS
650.0000 mg | ORAL_TABLET | Freq: Four times a day (QID) | ORAL | Status: DC | PRN
  Administered 2020-05-06: 650 mg via ORAL
  Filled 2020-05-06: qty 2

## 2020-05-06 MED ORDER — ACETAMINOPHEN 650 MG RE SUPP: 650.0000 mg | Freq: Four times a day (QID) | RECTAL | Status: DC | PRN

## 2020-05-06 MED ORDER — SODIUM CHLORIDE 0.9 % IV SOLN
500.0000 mg | Freq: Once | INTRAVENOUS | Status: AC
  Administered 2020-05-06: 500 mg via INTRAVENOUS
  Filled 2020-05-06: qty 500

## 2020-05-06 MED ORDER — BENAZEPRIL HCL 10 MG PO TABS
10.0000 mg | ORAL_TABLET | Freq: Every day | ORAL | Status: DC
  Administered 2020-05-06: 10 mg via ORAL
  Filled 2020-05-06: qty 1

## 2020-05-06 MED ORDER — SODIUM CHLORIDE 0.9 % IV SOLN
500.0000 mg | INTRAVENOUS | Status: DC
  Administered 2020-05-07 – 2020-05-08 (×2): 500 mg via INTRAVENOUS
  Filled 2020-05-06 (×3): qty 500

## 2020-05-06 MED ORDER — SODIUM CHLORIDE 0.9 % IV BOLUS
1000.0000 mL | Freq: Once | INTRAVENOUS | Status: AC
  Administered 2020-05-06: 1000 mL via INTRAVENOUS

## 2020-05-06 MED ORDER — ATENOLOL-CHLORTHALIDONE 50-25 MG PO TABS: 1.0000 | ORAL_TABLET | Freq: Every day | ORAL | Status: DC

## 2020-05-06 MED ORDER — INSULIN ASPART 100 UNIT/ML ~~LOC~~ SOLN
0.0000 [IU] | Freq: Three times a day (TID) | SUBCUTANEOUS | Status: DC
  Administered 2020-05-06 – 2020-05-07 (×2): 1 [IU] via SUBCUTANEOUS
  Administered 2020-05-07: 2 [IU] via SUBCUTANEOUS
  Administered 2020-05-08: 1 [IU] via SUBCUTANEOUS
  Filled 2020-05-06: qty 0.09

## 2020-05-06 MED ORDER — PANTOPRAZOLE SODIUM 40 MG PO TBEC
40.0000 mg | DELAYED_RELEASE_TABLET | Freq: Every day | ORAL | Status: DC
  Administered 2020-05-07 – 2020-05-08 (×2): 40 mg via ORAL
  Filled 2020-05-06 (×2): qty 1

## 2020-05-06 MED ORDER — SODIUM CHLORIDE 0.9 % IV SOLN
1.0000 g | INTRAVENOUS | Status: DC
  Administered 2020-05-07 – 2020-05-08 (×2): 1 g via INTRAVENOUS
  Filled 2020-05-06 (×2): qty 1

## 2020-05-06 MED ORDER — ROSUVASTATIN CALCIUM 20 MG PO TABS
20.0000 mg | ORAL_TABLET | Freq: Every day | ORAL | Status: DC
  Administered 2020-05-07 – 2020-05-08 (×2): 20 mg via ORAL
  Filled 2020-05-06 (×2): qty 1

## 2020-05-06 MED ORDER — SODIUM CHLORIDE 0.9 % IV SOLN
1.0000 g | Freq: Once | INTRAVENOUS | Status: AC
  Administered 2020-05-06: 1 g via INTRAVENOUS
  Filled 2020-05-06: qty 10

## 2020-05-06 NOTE — ED Notes (Signed)
Stuck for lactic blood specimen, no success

## 2020-05-06 NOTE — ED Provider Notes (Signed)
[facility] DEPT Provider Note: Georgena Spurling, MD, FACEP  CSN: [id number] MRN: [id number] ARRIVAL: 05/06/20 at [place]: [id number]   CHIEF COMPLAINT  Fever   HISTORY OF PRESENT ILLNESS  05/06/20 3:59 AM [name] is a 79 y.o. female who was diagnosed with a urinary tract infection 2 or 3 days ago.  She was having burning with urination and saw her PCP who placed her on Macrobid.  Despite being on Macrobid yesterday she developed fever (to 101.9 here), fatigue, generalized weakness, confusion and back pain.  She rates the pain in her back as an 8 out of 10, sharp in nature.  It is somewhat worse with movement.  She has not taken anything for her fever.  She is not having vomiting or diarrhea.  She is equivocal about having cough or shortness of breath.  She is complaining of a headache as well as a sensation that she has bad breath.   Past Medical History:  Diagnosis Date  . Abdominal pain   . Arthritis    cervical disc degeneration/ oa left knee, carpal tunnel rt wrist, adhesive capsulitis right shoulder, rt hand weakness; lumbar degeneration  . Carpal tunnel syndrome   . 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 Constipation   . Diabetes mellitus without complication ([place])    borderline - diet control  . Diverticulitis hx of  . Diverticulosis   . E. coli infection    2020  . Frequent UTI    hx of urethral injury during colon surgery - states frequent uti's since  . GERD (gastroesophageal reflux disease)   . H/O hiatal hernia   . History of palpitations    in the past  . History of shingles    has a lingering itching on back where shingles were  . Hyperlipidemia   . Hypertension   . Numbness and tingling in right hand    pt. states has numbness of right hand very frequently-watch positioning  .  Obesity   . Osteoarthritis   . Osteoporosis   . Pain    pain left knee and pain right hip and right groin  . Personal history of colonic polyps-adenoma 08/26/2008  . Pneumonia 2005  . Vitamin D deficiency     Past Surgical History:  Procedure Laterality Date  . ABDOMINAL HYSTERECTOMY    . bladder tack    . BREAST BIOPSY Left   . BREAST SURGERY     breast duct resection- benign  . CARPAL TUNNEL RELEASE Right 06/04/2014   Procedure: RIGHT CARPAL TUNNEL RELEASE;  Surgeon: Roseanne Kaufman, MD;  Location: [facility];  Service: Orthopedics;  Laterality: Right;  . COLON RESECTION  2008   hx diverticulosis  . COLON SURGERY    . COLONOSCOPY    . colonoscopy [id number]    . JOINT REPLACEMENT    . OOPHORECTOMY    . POLYPECTOMY    . skin graft left arm - traumatic compression injury left upper arm    . temporary ureter stent    . TOTAL KNEE ARTHROPLASTY  01/05/2012   Procedure: TOTAL KNEE ARTHROPLASTY;  Surgeon: [name], MD;  Location: [facility];  Service: Orthopedics;  Laterality: Right;  . TOTAL KNEE ARTHROPLASTY Left 08/17/2014   Procedure: LEFT TOTAL KNEE ARTHROPLASTY;  Surgeon: [name], MD;  Location: [facility];  Service: Orthopedics;  Laterality: Left;  . ureter repair for tyransected left ureter      Family History  Problem Relation Age of Onset  . Breast cancer Mother 31  . Hypertension Mother   . Prostate cancer Father   . Hypertension Father   . Dementia Sister   . Lung cancer Brother   . Hypertension Brother   . COPD Brother   . Cancer Maternal Grandmother   . Arthritis Sister   . Hypertension Sister   . Arthritis Sister   . Hypertension Child   . Hypertension Child   . Hypertension Child   . Colon polyps Neg Hx   . Esophageal cancer Neg Hx   . Rectal cancer Neg Hx   . Stomach cancer Neg Hx   . Colon cancer Neg Hx     Social History   Tobacco Use  . Smoking status: Never Smoker  . Smokeless tobacco: Never Used  Vaping Use  .  Vaping Use: Never used  Substance Use Topics  . Alcohol use: No  . Drug use: No    Prior to Admission medications   Medication Sig Start Date End Date Taking? Authorizing Provider  allopurinol (ZYLOPRIM) 300 MG tablet TAKE 1 TABLET(300 MG) BY MOUTH DAILY 04/09/20   [name], MD  amLODipine-benazepril (LOTREL) 5-10 MG capsule TAKE 1 CAPSULE BY MOUTH EVERY DAY 04/09/20   [name], MD  aspirin EC 81 MG tablet Take 1 tablet (81 mg total) by mouth daily. 06/24/19   [name], MD  atenolol-chlorthalidone (TENORETIC) 50-25 MG tablet TAKE 1 TABLET BY MOUTH EVERY DAY AT 11 AM 12/29/19   [name], MD  Dulaglutide (TRULICITY) 1.69 CV/8.9FY SOPN INJECT 0.5 ML(0.75 MG) SUBCUTANEOUSLY EVERY WEEK IN ABDOMEN, THIGH OR UPPER ARM ROTATING INJECTION SITE 07/22/19   [name], MD  Multiple Vitamins-Minerals (ZINC PO) Take by mouth. Patient not taking: Reported on 05/04/2020    [provider]  nitrofurantoin, macrocrystal-monohydrate, (MACROBID) 100 MG capsule Take 1 capsule (100 mg total) by mouth 2 (two) times daily for 5 days. 05/04/20 05/09/20  [name], FNP  NON FORMULARY Blood balance formula Ordered from Dr. Irena Cords 1 capsule daily. Patient not taking: Reported on 05/04/2020    [provider]  Jonetta Speak LANCETS 10F MISC Use to check blood glucose once daily 06/19/14   Kennyth Arnold, FNP  [facility] VERIO test strip USE AS DIRECTED TO TEST BLOOD SUGAR BID 02/25/19   [provider]  OVER THE COUNTER MEDICATION OTC immune system booster daily Patient not taking: Reported on 05/04/2020    [provider]  pantoprazole (PROTONIX) 40 MG tablet TAKE 1 TABLET(40 MG) BY MOUTH DAILY 12/26/19   [name], MD  Probiotic Product (PROBIOTIC DAILY PO) Take 3 capsules by mouth daily. Patient not taking: Reported on 05/04/2020    [provider]  rosuvastatin (CRESTOR) 20 MG tablet TAKE 1 TABLET(20 MG) BY MOUTH DAILY 04/09/20   [name], MD     Allergies Atorvastatin calcium, Bee venom, Lipitor [atorvastatin calcium], Oxycodone, and Vicodin [hydrocodone-acetaminophen]   REVIEW OF SYSTEMS  Negative except as noted here or in the History of Present Illness.   PHYSICAL EXAMINATION  Initial Vital Signs Blood pressure (!) 148/100, pulse 95, temperature (!) 101.9 F (38.8 C), temperature source Oral, resp. rate (!) 33, height 5\' 9"  (1.753 m), weight 108.9 kg, SpO2 96 %.  Examination General: Well-developed, well-nourished female in no acute distress; appearance consistent with age of record  HENT: normocephalic; atraumatic Eyes: pupils equal, round and reactive to light; extraocular muscles intact; right pseudophakia Neck: supple Heart: regular rate and rhythm Lungs: clear to auscultation bilaterally Abdomen: soft; nondistended; nontender; bowel sounds present Extremities: No deformity; full range of motion; pulses normal; trace edema of lower legs Neurologic: Awake, alert and oriented; motor function intact in all extremities and symmetric; no facial droop Skin: Warm and dry Psychiatric: Normal mood and affect   RESULTS  Summary of this visit's results, reviewed and interpreted by myself:   EKG Interpretation  Date/Time:  Thursday May 06 2020 03:32:23 EDT Ventricular Rate:  95 PR Interval:    QRS Duration: 155 QT Interval:  381 QTC Calculation: 479 R Axis:   63 Text Interpretation: Sinus rhythm Ventricular trigeminy IVCD, consider atypical LBBB PVCs not seen previously Confirmed by Aalyssa Elderkin [phone]) on 05/06/2020 3:49:00 AM      Laboratory Studies: Results for orders placed or performed during the hospital encounter of 05/06/20 (from the past 24 hour(s))  Lactic acid, plasma     Status: Abnormal   Collection Time: 05/06/20  4:20 AM  Result Value Ref Range   Lactic Acid, Venous 2.5 (HH) 0.5 - 1.9 mmol/L  Comprehensive metabolic panel     Status: Abnormal   Collection Time: 05/06/20  4:20 AM  Result Value  Ref Range   Sodium 137 135 - 145 mmol/L   Potassium 3.3 (L) 3.5 - 5.1 mmol/L   Chloride 100 98 - 111 mmol/L   CO2 22 22 - 32 mmol/L   Glucose, Bld 173 (H) 70 - 99 mg/dL   BUN 22 8 - 23 mg/dL   Creatinine, Ser 1.13 (H) 0.44 - 1.00 mg/dL   Calcium 9.0 8.9 - 10.3 mg/dL   Total Protein 7.0 6.5 - 8.1 g/dL   Albumin 3.8 3.5 - 5.0 g/dL   AST 41 15 - 41 U/L   ALT 31 0 - 44 U/L   Alkaline Phosphatase 47 38 - 126 U/L   Total Bilirubin 1.4 (H) 0.3 - 1.2 mg/dL   GFR calc non Af Amer 47 (L) >60 mL/min   GFR calc Af Amer 54 (L) >60 mL/min   Anion gap 15 5 - 15  CBC with Differential     Status: Abnormal   Collection Time: 05/06/20  4:20 AM  Result Value Ref Range   WBC 15.5 (H) 4.0 - 10.5 K/uL   RBC 4.66 3.87 - 5.11 MIL/uL   Hemoglobin 14.4 12.0 - 15.0 g/dL   HCT 42.7 36 - 46 %   MCV 91.6 80.0 - 100.0 fL   MCH 30.9 26.0 - 34.0 pg   MCHC 33.7 30.0 - 36.0 g/dL   RDW 13.8 11.5 - 15.5 %   Platelets 194 150 - 400 K/uL   nRBC 0.0 0.0 - 0.2 %   Neutrophils Relative % 93 %   Neutro Abs 14.4 (H) 1.7 - 7.7 K/uL   Lymphocytes Relative 2 %   Lymphs Abs 0.3 (L) 0.7 - 4.0 K/uL   Monocytes Relative 3 %   Monocytes Absolute 0.5 0 - 1 K/uL   Eosinophils Relative 1 %   Eosinophils Absolute 0.1 0 - 0 K/uL   Basophils Relative 0 %   Basophils Absolute 0.0 0 - 0 K/uL   Immature Granulocytes 1 %   Abs Immature Granulocytes 0.16 (H) 0.00 - 0.07 K/uL  Urinalysis, Routine w reflex microscopic     Status: None   Collection Time: 05/06/20  6:00 AM  Result Value Ref Range   Color, Urine YELLOW YELLOW   APPearance CLEAR CLEAR   Specific Gravity, Urine 1.011 1.005 - 1.030   pH 5.0 5.0 - 8.0   Glucose, UA NEGATIVE NEGATIVE mg/dL   Hgb urine dipstick NEGATIVE NEGATIVE   Bilirubin Urine NEGATIVE NEGATIVE   Ketones, ur NEGATIVE NEGATIVE mg/dL   Protein, ur NEGATIVE NEGATIVE mg/dL   Nitrite NEGATIVE NEGATIVE   Leukocytes,Ua NEGATIVE NEGATIVE   Imaging Studies: DG Chest 2 View  Result Date:  05/06/2020 CLINICAL DATA:  Fever. EXAM: CHEST - 2 VIEW COMPARISON:  07/13/2017. FINDINGS: Mediastinum and hilar structures normal. Left base infiltrate cannot be excluded. Stable elevation right hemidiaphragm with mild right base subsegmental atelectasis again noted. No pleural effusion or pneumothorax. Heart size stable. No pulmonary venous congestion. IMPRESSION: 1.  Left base infiltrate cannot be excluded. 2. Stable elevation right hemidiaphragm with right base subsegmental atelectasis. Electronically Signed   By: Marcello Moores  Register   On: 05/06/2020 06:33    ED COURSE and MDM  Nursing notes, initial and subsequent vitals signs, including pulse oximetry, reviewed and interpreted by myself.  Vitals:   05/06/20 0456 05/06/20 0500 05/06/20 0530 05/06/20 0630  BP: (!) 158/70 (!) 153/64 (!) 149/68 (!) 130/52  Pulse: 93 97 97 82  Resp: (!) 33 (!) 25 (!) 29 (!) 29  Temp:      TempSrc:      SpO2: 94% 94% 93% 94%  Weight:      Height:       Medications  sodium chloride flush (NS) 0.9 % injection 3 mL (3 mLs Intravenous Not Given 05/06/20 0446)  cefTRIAXone (ROCEPHIN) 1 g in sodium chloride 0.9 % 100 mL IVPB (1 g Intravenous New Bag/Given 05/06/20 0704)  azithromycin (ZITHROMAX) 500 mg in sodium chloride 0.9 % 250 mL IVPB (has no administration in time range)  acetaminophen (TYLENOL) tablet 650 mg (650 mg Oral Given 05/06/20 0458)  sodium chloride 0.9 % bolus 1,000 mL (1,000 mLs Intravenous New Bag/Given 05/06/20 0455)   6:47 AM Rocephin and Zithromax started for suspected left base infiltrate.  Urinalysis is unremarkable and reported UTI likely improved with Macrobid.  Patient is not hypoxic but she required assistance to ambulate to the bathroom.  Her lactate is elevated and her respirations are.  I will get her admitted to the hospitalist service.   PROCEDURES  Procedures   ED DIAGNOSES     ICD-10-CM   1. Community acquired pneumonia of left lower lobe of lung  J18.9   2. Generalized weakness   R53.1        Valeri Sula, Jenny Reichmann, MD 05/06/20 [phone]

## 2020-05-06 NOTE — ED Notes (Signed)
Patient provided with lunch tray

## 2020-05-06 NOTE — ED Notes (Signed)
Patient ambulated to bathroom with assistance.

## 2020-05-06 NOTE — ED Notes (Signed)
Unable to draw second culture.

## 2020-05-06 NOTE — ED Triage Notes (Signed)
Pt presents to ED with a 101.9 fever, fatigue, confusion, and back pain. Reports that she is currently being treated for a UTI with Macrobid.

## 2020-05-06 NOTE — ED Notes (Signed)
Patient transported to X-ray 

## 2020-05-06 NOTE — H&P (Addendum)
History and Physical:    [name]   JOA:[id number] DOB: [birth year] DOA: 05/06/2020  Referring MD/provider: Dr. Florina Ou PCP: [name], MD   Patient coming from: Home  Chief Complaint: Weakness  History of Present Illness:   [name] is an 79 y.o. female with PMH significant for HTN, DM 2, diverticulosis who was in her usual state of health until 1 week prior to admission when she felt weak and tired and had back pain.  She was seen by her PCP who treated her for UTI with 1 dose of ceftriaxone and started her on Macrobid.  Patient states she felt a little bit better after the antibiotics however over the past 2 days has developed profound weakness and last night she had difficulty getting out of bed felt like she was going to faint so came to the ED.  Patient thinks her back pain has resolved with the antibiotics however patient's husband does admit that she has had a cough for the past month.  Cough has been nonproductive but has increased over the past week.  Patient states she has been short of breath intermittently but states that she is sometimes short of breath anyway.  She does think that her dyspnea on exertion has worsened over the past couple of weeks.  ED Course:  The patient was noted to be febrile to 101.9.  Other vital signs were normal.  She did have a leukocytosis to 15.  She appeared weak and tired.  O2 sats were 95 to 97% on room air.   ROS:   ROS   Review of Systems: General: Denies fever, chills, admits to weakness and some malaise. Respiratory: Denies hemoptysis Cardiovascular: Denies chest pain or palpitations GI: Denies nausea, vomiting, diarrhea or constipation Blood/lymphatics: Denies easy bruising or bleeding Mood/affect: Denies anxiety/depression    Past Medical History:   Past Medical History:  Diagnosis Date  . Abdominal pain   . Arthritis    cervical disc degeneration/ oa left knee, carpal tunnel rt wrist, adhesive capsulitis  right shoulder, rt hand weakness; lumbar degeneration  . Carpal tunnel syndrome   . 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 Constipation   . Diabetes mellitus without complication ([place])    borderline - diet control  . Diverticulitis hx of  . Diverticulosis   . E. coli infection    2020  . Frequent UTI    hx of urethral injury during colon surgery - states frequent uti's since  . GERD (gastroesophageal reflux disease)   . H/O hiatal hernia   . History of palpitations    in the past  . History of shingles    has a lingering itching on back where shingles were  . Hyperlipidemia   . Hypertension   . Numbness and tingling in right hand    pt. states has numbness of right hand very frequently-watch positioning  . Obesity   . Osteoarthritis   . Osteoporosis   . Pain    pain left knee and pain right hip and right groin  . Personal history of colonic polyps-adenoma 08/26/2008  . Pneumonia 2005  . Vitamin D deficiency     Past Surgical History:   Past Surgical History:  Procedure Laterality Date  . ABDOMINAL HYSTERECTOMY    . bladder tack    .  BREAST BIOPSY Left   . BREAST SURGERY     breast duct resection- benign  . CARPAL TUNNEL RELEASE Right 06/04/2014   Procedure: RIGHT CARPAL TUNNEL RELEASE;  Surgeon: Roseanne Kaufman, MD;  Location: [facility];  Service: Orthopedics;  Laterality: Right;  . COLON RESECTION  2008   hx diverticulosis  . COLON SURGERY    . COLONOSCOPY    . colonoscopy [id number]    . JOINT REPLACEMENT    . OOPHORECTOMY    . POLYPECTOMY    . skin graft left arm - traumatic compression injury left upper arm    . temporary ureter stent    . TOTAL KNEE ARTHROPLASTY  01/05/2012   Procedure: TOTAL KNEE ARTHROPLASTY;  Surgeon: [name], MD;  Location: [facility];  Service:  Orthopedics;  Laterality: Right;  . TOTAL KNEE ARTHROPLASTY Left 08/17/2014   Procedure: LEFT TOTAL KNEE ARTHROPLASTY;  Surgeon: [name], MD;  Location: [facility];  Service: Orthopedics;  Laterality: Left;  . ureter repair for tyransected left ureter      Social History:   Social History   Socioeconomic History  . Marital status: Married    Spouse name: Not on file  . Number of children: 6  . Years of education: Not on file  . Highest education level: Not on file  Occupational History  . Occupation: Games developer: [employer]  Tobacco Use  . Smoking status: Never Smoker  . Smokeless tobacco: Never Used  Vaping Use  . Vaping Use: Never used  Substance and Sexual Activity  . Alcohol use: No  . Drug use: No  . Sexual activity: Not Currently  Other Topics Concern  . Not on file  Social History Narrative   The patient is married and has 6 children   Operates a business   No alcohol tobacco or drug use   Social Determinants of Radio broadcast assistant Strain: High Risk  . Difficulty of Paying Living Expenses: Hard  Food Insecurity: No Food Insecurity  . Worried About Charity fundraiser in the Last Year: Never true  . Ran Out of Food in the Last Year: Never true  Transportation Needs: No Transportation Needs  . Lack of Transportation (Medical): No  . Lack of Transportation (Non-Medical): No  Physical Activity: Inactive  . Days of Exercise per Week: 0 days  . Minutes of Exercise per Session: 0 min  Stress: No Stress Concern Present  . Feeling of Stress : Not at all  Social Connections:   . Frequency of Communication with Friends and Family:   . Frequency of Social Gatherings with Friends and Family:   . Attends Religious Services:   . Active Member of Clubs or Organizations:   . Attends Archivist Meetings:   Marland Kitchen Marital Status:   Intimate Partner Violence:   . Fear of Current or Ex-Partner:   . Emotionally Abused:   Marland Kitchen Physically Abused:     . Sexually Abused:     Allergies   Atorvastatin calcium, Bee venom, Lipitor [atorvastatin calcium], Oxycodone, and Vicodin [hydrocodone-acetaminophen]  Family history:   Family History  Problem Relation Age of Onset  . Breast cancer Mother 60  . Hypertension Mother   . Prostate cancer Father   . Hypertension Father   . Dementia Sister   . Lung cancer Brother   . Hypertension Brother   . COPD Brother   . Cancer Maternal Grandmother   . Arthritis Sister   .  Hypertension Sister   . Arthritis Sister   . Hypertension Child   . Hypertension Child   . Hypertension Child   . Colon polyps Neg Hx   . Esophageal cancer Neg Hx   . Rectal cancer Neg Hx   . Stomach cancer Neg Hx   . Colon cancer Neg Hx     Current Medications:   Prior to Admission medications   Medication Sig Start Date End Date Taking? Authorizing Provider  allopurinol (ZYLOPRIM) 300 MG tablet TAKE 1 TABLET(300 MG) BY MOUTH DAILY Patient taking differently: Take 300 mg by mouth daily.  04/09/20   [name], MD  amLODipine-benazepril (LOTREL) 5-10 MG capsule TAKE 1 CAPSULE BY MOUTH EVERY DAY Patient taking differently: Take 1 capsule by mouth daily.  04/09/20   [name], MD  aspirin EC 81 MG tablet Take 1 tablet (81 mg total) by mouth daily. 06/24/19   [name], MD  atenolol-chlorthalidone (TENORETIC) 50-25 MG tablet TAKE 1 TABLET BY MOUTH EVERY DAY AT 11 AM Patient taking differently: Take 1 tablet by mouth daily. At 11am 12/29/19   [name], MD  Dulaglutide (TRULICITY) 8.67 EH/2.0NO SOPN INJECT 0.5 ML(0.75 MG) SUBCUTANEOUSLY EVERY WEEK IN ABDOMEN, THIGH OR UPPER ARM ROTATING INJECTION SITE 07/22/19   [name], MD  Multiple Vitamins-Minerals (ZINC PO) Take by mouth. Patient not taking: Reported on 05/04/2020    [provider]  nitrofurantoin, macrocrystal-monohydrate, (MACROBID) 100 MG capsule Take 1 capsule (100 mg total) by mouth 2 (two) times daily for 5 days. 05/04/20 05/09/20   [name], FNP  NON FORMULARY Blood balance formula Ordered from Dr. Irena Cords 1 capsule daily. Patient not taking: Reported on 05/04/2020    [provider]  Jonetta Speak LANCETS 70J MISC Use to check blood glucose once daily 06/19/14   Kennyth Arnold, FNP  [facility] VERIO test strip USE AS DIRECTED TO TEST BLOOD SUGAR BID 02/25/19   [provider]  OVER THE COUNTER MEDICATION OTC immune system booster daily Patient not taking: Reported on 05/04/2020    [provider]  pantoprazole (PROTONIX) 40 MG tablet TAKE 1 TABLET(40 MG) BY MOUTH DAILY Patient taking differently: Take 40 mg by mouth daily.  12/26/19   [name], MD  Probiotic Product (PROBIOTIC DAILY PO) Take 3 capsules by mouth daily. Patient not taking: Reported on 05/04/2020    [provider]  rosuvastatin (CRESTOR) 20 MG tablet TAKE 1 TABLET(20 MG) BY MOUTH DAILY Patient taking differently: Take 20 mg by mouth daily.  04/09/20   [name], MD    Physical Exam:   Vitals:   05/06/20 0730 05/06/20 0752 05/06/20 0754 05/06/20 0806  BP: (!) 149/61 (!) 138/55 (!) 135/55 136/71  Pulse: 90 85 84 86  Resp: (!) 33 (!) 34 (!) 32 (!) 26  Temp:   99.8 F (37.7 C)   TempSrc:   Oral   SpO2: 95% 96% 95% 97%  Weight:      Height:         Physical Exam: Blood pressure 136/71, pulse 86, temperature 99.8 F (37.7 C), temperature source Oral, resp. rate (!) 26, height 5\' 9"  (1.753 m), weight 108.9 kg, SpO2 97 %. Gen: Obese tired appearing female lying flat in bed with mild tachypnea but no labored breathing. Eyes: sclera anicteric, conjuctiva mildly injected bilaterally CVS: S1-S2, regulary, no gallops Respiratory: Reasonable air entry, rales at bases, difficult to examine well due to body habitus. GI: NABS, soft, NT  LE: No edema. No cyanosis  Neuro: A/O x 3, grossly nonfocal.  Psych: patient is logical and coherent, judgement and insight appear normal, mood and affect appropriate to  situation. Skin: no rashes or lesions or ulcers,    Data Review:    Labs: Basic Metabolic Panel: Recent Labs  Lab 05/06/20 0420  NA 137  K 3.3*  CL 100  CO2 22  GLUCOSE 173*  BUN 22  CREATININE 1.13*  CALCIUM 9.0   Liver Function Tests: Recent Labs  Lab 05/06/20 0420  AST 41  ALT 31  ALKPHOS 47  BILITOT 1.4*  PROT 7.0  ALBUMIN 3.8   No results for input(s): LIPASE, AMYLASE in the last 168 hours. No results for input(s): AMMONIA in the last 168 hours. CBC: Recent Labs  Lab 05/06/20 0420  WBC 15.5*  NEUTROABS 14.4*  HGB 14.4  HCT 42.7  MCV 91.6  PLT 194   Cardiac Enzymes: No results for input(s): CKTOTAL, CKMB, CKMBINDEX, TROPONINI in the last 168 hours.  BNP (last 3 results) No results for input(s): PROBNP in the last 8760 hours. CBG: No results for input(s): GLUCAP in the last 168 hours.  Urinalysis    Component Value Date/Time   COLORURINE YELLOW 05/06/2020 0600   APPEARANCEUR CLEAR 05/06/2020 0600   LABSPEC 1.011 05/06/2020 0600   PHURINE 5.0 05/06/2020 0600   GLUCOSEU NEGATIVE 05/06/2020 0600   HGBUR NEGATIVE 05/06/2020 0600   HGBUR negative 07/18/2010 0915   BILIRUBINUR NEGATIVE 05/06/2020 0600   BILIRUBINUR negative 05/04/2020 1552   KETONESUR NEGATIVE 05/06/2020 0600   PROTEINUR NEGATIVE 05/06/2020 0600   UROBILINOGEN 1.0 05/04/2020 1552   UROBILINOGEN 1.0 01/24/2015 0131   NITRITE NEGATIVE 05/06/2020 0600   LEUKOCYTESUR NEGATIVE 05/06/2020 0600      Radiographic Studies: DG Chest 2 View  Result Date: 05/06/2020 CLINICAL DATA:  Fever. EXAM: CHEST - 2 VIEW COMPARISON:  07/13/2017. FINDINGS: Mediastinum and hilar structures normal. Left base infiltrate cannot be excluded. Stable elevation right hemidiaphragm with mild right base subsegmental atelectasis again noted. No pleural effusion or pneumothorax. Heart size stable. No pulmonary venous congestion. IMPRESSION: 1.  Left base infiltrate cannot be excluded. 2. Stable elevation right  hemidiaphragm with right base subsegmental atelectasis. Electronically Signed   By: Marcello Moores  Register   On: 05/06/2020 06:33    EKG: Independently reviewed.  Sinus rhythm at 95, LBBB, frequent PVCs with some trigeminy noted.   Assessment/Plan:   Active Problems:   Essential hypertension   Morbidly obese (HCC)   Type 2 diabetes mellitus without complication ([place])   CAP (community acquired pneumonia)  CAP Continue treatment with ceftriaxone and azithromycin Gentle hydration LR at 100 an hour for 24-hour O2 sats remained 95 to 97% on room air  Hypokalemia K. Dur 40 Meq  p.o. x1 ordered  Abnormal EKG Patient with multiple PVCs seen on EKG and telemetry These have not been seen before although her LBBB is old Potassium is low at 3.3, this is being supplemented Will check magnesium and supplement as warranted--magnesium is 1.5, will provide 2 g IV and recheck in the morning. We will keep patient on telemetry for 24 hours Can consider echocardiogram and/or cardiology consult if these persist after repletion of electrolytes  HTN Continue Lotrel and Tenoretic  DM2 Hold Trulicity SSI 3 times daily AC with CBG  Obesity Complicates care    Other information:   DVT prophylaxis: Enoxaparin ordered. Code Status: Full Family Communication: Patient's husband was at bedside throughout Disposition Plan: Home Consults called: None Admission status: Inpatient  [place]  [place]  If 7PM-7AM, please contact night-coverage [email or web address] Password [email or web address] 05/06/2020, 9:00 AM

## 2020-05-07 ENCOUNTER — Ambulatory Visit: Payer: Self-pay

## 2020-05-07 DIAGNOSIS — E1122 Type 2 diabetes mellitus with diabetic chronic kidney disease: Secondary | ICD-10-CM

## 2020-05-07 DIAGNOSIS — I10 Essential (primary) hypertension: Secondary | ICD-10-CM

## 2020-05-07 DIAGNOSIS — A419 Sepsis, unspecified organism: Principal | ICD-10-CM

## 2020-05-07 DIAGNOSIS — N183 Chronic kidney disease, stage 3 unspecified: Secondary | ICD-10-CM

## 2020-05-07 LAB — COMPREHENSIVE METABOLIC PANEL
ALT: 48 U/L — ABNORMAL HIGH (ref 0–44)
AST: 76 U/L — ABNORMAL HIGH (ref 15–41)
Albumin: 3 g/dL — ABNORMAL LOW (ref 3.5–5.0)
Alkaline Phosphatase: 36 U/L — ABNORMAL LOW (ref 38–126)
Anion gap: 10 (ref 5–15)
BUN: 18 mg/dL (ref 8–23)
CO2: 26 mmol/L (ref 22–32)
Calcium: 8.4 mg/dL — ABNORMAL LOW (ref 8.9–10.3)
Chloride: 100 mmol/L (ref 98–111)
Creatinine, Ser: 1.23 mg/dL — ABNORMAL HIGH (ref 0.44–1.00)
GFR calc Af Amer: 49 mL/min — ABNORMAL LOW (ref >60)
GFR calc non Af Amer: 42 mL/min — ABNORMAL LOW (ref >60)
Glucose, Bld: 123 mg/dL — ABNORMAL HIGH (ref 70–99)
Potassium: 3.1 mmol/L — ABNORMAL LOW (ref 3.5–5.1)
Sodium: 136 mmol/L (ref 135–145)
Total Bilirubin: 1.8 mg/dL — ABNORMAL HIGH (ref 0.3–1.2)
Total Protein: 6.2 g/dL — ABNORMAL LOW (ref 6.5–8.1)

## 2020-05-07 LAB — URINE CULTURE: Culture: NO GROWTH

## 2020-05-07 LAB — MAGNESIUM: Magnesium: 1.7 mg/dL (ref 1.7–2.4)

## 2020-05-07 LAB — GLUCOSE, CAPILLARY
Glucose-Capillary: 88 mg/dL (ref 70–99)
Glucose-Capillary: 185 mg/dL — ABNORMAL HIGH (ref 70–99)
Glucose-Capillary: 132 mg/dL — ABNORMAL HIGH (ref 70–99)

## 2020-05-07 LAB — LACTIC ACID, PLASMA: Lactic Acid, Venous: 1.7 mmol/L (ref 0.5–1.9)

## 2020-05-07 MED ORDER — SODIUM CHLORIDE 0.9 % IV BOLUS
250.0000 mL | Freq: Once | INTRAVENOUS | Status: AC
  Administered 2020-05-07: 250 mL via INTRAVENOUS

## 2020-05-07 MED ORDER — POTASSIUM CHLORIDE 10 MEQ/100ML IV SOLN
10.0000 meq | INTRAVENOUS | Status: AC
  Administered 2020-05-07 (×2): 10 meq via INTRAVENOUS
  Filled 2020-05-07 (×2): qty 100

## 2020-05-07 MED ORDER — LACTATED RINGERS IV BOLUS
500.0000 mL | Freq: Once | INTRAVENOUS | Status: AC
  Administered 2020-05-07: 500 mL via INTRAVENOUS

## 2020-05-07 NOTE — Progress Notes (Signed)
Patients temp 101 and back pain 3/10. PRN Tylenol administered and ice packs under patients arm. This intervention was successful, temp is 98.2. Will continue to monitor the patient.

## 2020-05-07 NOTE — Chronic Care Management (AMB) (Signed)
  Chronic Care Management   Inpatient Admit Review Note  05/07/2020 Name: [name] MRN: [id number] DOB: 04-Nov-1940  [name] is a 79 y.o. year old female who is a primary care patient of [name], MD. [name] is actively engaged with the embedded care management team in the primary care practice and is being followed by RN Case Manager for assistance with disease management and care coordination needs related to HTN, DMII and CKD Stage III.   [name] is currently admitted to the hospital for evaluation and treatment of community acquired pneumonia.   Plan: CM team will collaborate with [facility] and will follow patient post discharge.    [name], BSW, CDP Social Worker, Certified Dementia Practitioner [place] [facility] [phone]

## 2020-05-07 NOTE — Progress Notes (Signed)
BP decreasing throughout shift 115/49, 122/52, 103/44. Pt is A&O x4 and showing no signs and symptoms of distress. Page sent to on-call physician, awaiting response. Will continue to monitor the patient.

## 2020-05-07 NOTE — Progress Notes (Signed)
New orders from on-call provider, 238ml bolus of NS, KCL 31mEq @ 100. Will continue to monitor he patient.

## 2020-05-07 NOTE — Progress Notes (Signed)
PROGRESS NOTE    [name]  QAS:[id number] DOB: 04-Jan-1941 DOA: 05/06/2020 PCP: [name], MD      Brief Narrative:  [name] is a 79 y.o. F with obesity, Diet controlled DM, HTN and gout who presented with back pain and cough/fever.  The patient was in her USOH until recently, developed back pain and malaise/weakness.  Seen by her PCP, started on Macrobid for UTI.  Symptoms progressed, family noted she had a cough and seemed out of breath with exertion, so they brought her to the ER.  In the ER, chest x-ray showed a left base opacity, lactate 2.5, she was febrile to 101, leukocytosis 15 K.  Started on antibiotics for pneumonia.       Assessment & Plan:  Sepsis due to left lower lobe community-acquired pneumonia Patient presented with leukocytosis, fever, lactate 2.5.  She was given IV fluids, ceftriaxone, and azithromycin.  -Continue ceftriaxone and azithromycin -RT therapy for flutter and incentive spirometry   Type 2 diabetes, diet controlled, without complication Glucose is normal -Continue sliding scale corrections -Continue aspirin, Crestor  Hypertension Blood pressure low overnight I suspect this is a combination of dehydration and antihypertensives. -Hold atenolol, amlodipine, benazepril, chlorthalidone  Gout -Continue allopurinol  Obesity BMI 35 with comorbidities -Continue PPI  Hypomagnesemia Supplemented, resolved      Disposition: Status is: Inpatient  Remains inpatient appropriate because: She has a PSI 88, as well as elevated lactate at admission, and hypotension to 94/55 this morning. About I feel she requires ongoing IV antibiotics and IV fluids.   Dispo: The patient is from: Home              Anticipated d/c is to: TBD              Anticipated d/c date is: 1 day              Patient currently is not medically stable to d/c.              MDM: The below labs and imaging reports were reviewed and summarized above.   Medication management as above.    DVT prophylaxis: enoxaparin (LOVENOX) injection 40 mg Start: 05/06/20 2200  Code Status: Code Family Communication: Daughter at the bedside    Consultants:     Procedures:     Antimicrobials:   Ceftriaxone and azithromycin 7/8>>  Culture data:   7/8 blood culture x2 no growth yet  7/8 urine culture no growth yet          Subjective: Patient saw some mild left back pain.  She has no confusion.  She had some fever yesterday afternoon.  She is not dizzy or confused.  She has a persistent cough.  Objective: Vitals:   05/06/20 2259 05/07/20 0034 05/07/20 0319 05/07/20 0854  BP: (!) 115/49 (!) 122/52 (!) 103/44 94/82  Pulse: 75 68 64 64  Resp: 20  17 18   Temp: (!) 101 F (38.3 C) 98.2 F (36.8 C) 98.1 F (36.7 C) 97.9 F (36.6 C)  TempSrc:    Oral  SpO2: 96%  100% 100%  Weight:      Height:        Intake/Output Summary (Last 24 hours) at 05/07/2020 1013 Last data filed at 05/07/2020 0300 Gross per 24 hour  Intake 1018.52 ml  Output 900 ml  Net 118.52 ml   Filed Weights   05/06/20 0323  Weight: 108.9 kg    Examination: General appearance: Obese adult female, alert and  in no acute distress.  Sitting up in bed, eating breakfast. HEENT: Anicteric, conjunctiva pink, lids and lashes normal. No nasal deformity, discharge, epistaxis.  Lips moist towards my top of the mouth, dentition in good repair, oropharynx moist, no oral lesions, hearing normal.   Skin: Warm and dry.   No suspicious rashes or lesions. Cardiac: RRR, nl S1-S2, no murmurs appreciated.  Capillary refill is brisk.  JVP not visible.  No LE edema.  Radial pulses 2+ and symmetric. Respiratory: Normal respiratory rate and rhythm.  Faint rales in the left base, otherwise normal.  No wheezing. Abdomen: Abdomen soft.  No TTP or guarding. No ascites, distension, hepatosplenomegaly.   MSK: No deformities or effusions. Neuro: Awake and alert.  EOMI, moves all  extremities. Speech fluent.    Psych: Sensorium intact and responding to questions, attention normal. Affect normal.  Judgment and insight appear normal.    Data Reviewed: I have personally reviewed following labs and imaging studies:  CBC: Recent Labs  Lab 05/06/20 0420  WBC 15.5*  NEUTROABS 14.4*  HGB 14.4  HCT 42.7  MCV 91.6  PLT 675   Basic Metabolic Panel: Recent Labs  Lab 05/06/20 0420 05/07/20 0315  NA 137 136  K 3.3* 3.1*  CL 100 100  CO2 22 26  GLUCOSE 173* 123*  BUN 22 18  CREATININE 1.13* 1.23*  CALCIUM 9.0 8.4*  MG 1.5* 1.7   GFR: Estimated Creatinine Clearance: 49.6 mL/min (A) (by C-G formula based on SCr of 1.23 mg/dL (H)). Liver Function Tests: Recent Labs  Lab 05/06/20 0420 05/07/20 0315  AST 41 76*  ALT 31 48*  ALKPHOS 47 36*  BILITOT 1.4* 1.8*  PROT 7.0 6.2*  ALBUMIN 3.8 3.0*   No results for input(s): LIPASE, AMYLASE in the last 168 hours. No results for input(s): AMMONIA in the last 168 hours. Coagulation Profile: No results for input(s): INR, PROTIME in the last 168 hours. Cardiac Enzymes: No results for input(s): CKTOTAL, CKMB, CKMBINDEX, TROPONINI in the last 168 hours. BNP (last 3 results) No results for input(s): PROBNP in the last 8760 hours. HbA1C: Recent Labs    05/06/20 0420  HGBA1C 6.7*   CBG: Recent Labs  Lab 05/06/20 1217 05/06/20 1720 05/06/20 2036 05/07/20 0736  GLUCAP 145* 120* 118* 88   Lipid Profile: No results for input(s): CHOL, HDL, LDLCALC, TRIG, CHOLHDL, LDLDIRECT in the last 72 hours. Thyroid Function Tests: No results for input(s): TSH, T4TOTAL, FREET4, T3FREE, THYROIDAB in the last 72 hours. Anemia Panel: No results for input(s): VITAMINB12, FOLATE, FERRITIN, TIBC, IRON, RETICCTPCT in the last 72 hours. Urine analysis:    Component Value Date/Time   COLORURINE YELLOW 05/06/2020 0600   APPEARANCEUR CLEAR 05/06/2020 0600   LABSPEC 1.011 05/06/2020 0600   PHURINE 5.0 05/06/2020 0600   GLUCOSEU  NEGATIVE 05/06/2020 0600   HGBUR NEGATIVE 05/06/2020 0600   HGBUR negative 07/18/2010 0915   BILIRUBINUR NEGATIVE 05/06/2020 0600   BILIRUBINUR negative 05/04/2020 1552   KETONESUR NEGATIVE 05/06/2020 0600   PROTEINUR NEGATIVE 05/06/2020 0600   UROBILINOGEN 1.0 05/04/2020 1552   UROBILINOGEN 1.0 01/24/2015 0131   NITRITE NEGATIVE 05/06/2020 0600   LEUKOCYTESUR NEGATIVE 05/06/2020 0600   Sepsis Labs: @LABRCNTIP (procalcitonin:4,lacticacidven:4)  ) Recent Results (from the past 240 hour(s))  Urine culture     Status: None   Collection Time: 05/06/20  6:00 AM   Specimen: Urine, Random  Result Value Ref Range Status   Specimen Description   Final    URINE, RANDOM Performed at  [facility], [place] [address], [place], [place] [address]    Special Requests   Final    NONE Performed at [facility], [place] [address] [place], [place] [address]    Culture   Final    NO GROWTH Performed at [facility], [place] [address], [place], [place] [address]    Report Status 05/07/2020 FINAL  Final  SARS Coronavirus 2 by RT PCR (hospital order, performed in [facility] hospital lab) Nasopharyngeal Nasopharyngeal Swab     Status: None   Collection Time: 05/06/20  8:56 AM   Specimen: Nasopharyngeal Swab  Result Value Ref Range Status   SARS Coronavirus 2 NEGATIVE NEGATIVE Final    Comment: (NOTE) SARS-CoV-2 target nucleic acids are NOT DETECTED.  The SARS-CoV-2 RNA is generally detectable in upper and lower respiratory specimens during the acute phase of infection. The lowest concentration of SARS-CoV-2 viral copies this assay can detect is 250 copies / mL. A negative result does not preclude SARS-CoV-2 infection and should not be used as the sole basis for treatment or other patient management decisions.  A negative result may occur with improper specimen collection / handling, submission of specimen other than nasopharyngeal swab, presence of viral  mutation(s) within the areas targeted by this assay, and inadequate number of viral copies (<250 copies / mL). A negative result must be combined with clinical observations, patient history, and epidemiological information.  Fact Sheet for Patients:   StrictlyIdeas.no  Fact Sheet for Healthcare Providers: BankingDealers.co.za  This test is not yet approved or  cleared by the [place] FDA and has been authorized for detection and/or diagnosis of SARS-CoV-2 by FDA under an Emergency Use Authorization (EUA).  This EUA will remain in effect (meaning this test can be used) for the duration of the COVID-19 declaration under Section 564(b)(1) of the Act, 21 U.S.C. section 360bbb-3(b)(1), unless the authorization is terminated or revoked sooner.  Performed at [facility], [place] [address], [place], [place] [address]          Radiology Studies: DG Chest 2 View  Result Date: 05/06/2020 CLINICAL DATA:  Fever. EXAM: CHEST - 2 VIEW COMPARISON:  07/13/2017. FINDINGS: Mediastinum and hilar structures normal. Left base infiltrate cannot be excluded. Stable elevation right hemidiaphragm with mild right base subsegmental atelectasis again noted. No pleural effusion or pneumothorax. Heart size stable. No pulmonary venous congestion. IMPRESSION: 1.  Left base infiltrate cannot be excluded. 2. Stable elevation right hemidiaphragm with right base subsegmental atelectasis. Electronically Signed   By: Marcello Moores  Register   On: 05/06/2020 06:33        Scheduled Meds: . allopurinol  300 mg Oral Daily  . aspirin EC  81 mg Oral Daily  . enoxaparin (LOVENOX) injection  40 mg Subcutaneous Q24H  . insulin aspart  0-9 Units Subcutaneous TID WC  . pantoprazole  40 mg Oral Daily  . potassium chloride  40 mEq Oral Once  . rosuvastatin  20 mg Oral Daily  . sodium chloride flush  3 mL Intravenous Once   Continuous Infusions: . azithromycin     . cefTRIAXone (ROCEPHIN)  IV 1 g (05/07/20 0940)     LOS: 1 day    Time spent: 25 minutes    Edwin Dada, MD [facility] 05/07/2020, 10:13 AM     Please page though [place] or Epic secure chat:  For [employer], Adult nurse

## 2020-05-08 LAB — COMPREHENSIVE METABOLIC PANEL
ALT: 52 U/L — ABNORMAL HIGH (ref 0–44)
AST: 73 U/L — ABNORMAL HIGH (ref 15–41)
Albumin: 3.1 g/dL — ABNORMAL LOW (ref 3.5–5.0)
Alkaline Phosphatase: 37 U/L — ABNORMAL LOW (ref 38–126)
Anion gap: 6 (ref 5–15)
BUN: 23 mg/dL (ref 8–23)
CO2: 24 mmol/L (ref 22–32)
Calcium: 8.5 mg/dL — ABNORMAL LOW (ref 8.9–10.3)
Chloride: 105 mmol/L (ref 98–111)
Creatinine, Ser: 1.2 mg/dL — ABNORMAL HIGH (ref 0.44–1.00)
GFR calc Af Amer: 50 mL/min — ABNORMAL LOW (ref >60)
GFR calc non Af Amer: 43 mL/min — ABNORMAL LOW (ref >60)
Glucose, Bld: 129 mg/dL — ABNORMAL HIGH (ref 70–99)
Potassium: 3.6 mmol/L (ref 3.5–5.1)
Sodium: 135 mmol/L (ref 135–145)
Total Bilirubin: 1.3 mg/dL — ABNORMAL HIGH (ref 0.3–1.2)
Total Protein: 6 g/dL — ABNORMAL LOW (ref 6.5–8.1)

## 2020-05-08 LAB — CBC
HCT: 38.6 % (ref 36.0–46.0)
Hemoglobin: 12.7 g/dL (ref 12.0–15.0)
MCH: 30 pg (ref 26.0–34.0)
MCHC: 32.9 g/dL (ref 30.0–36.0)
MCV: 91 fL (ref 80.0–100.0)
Platelets: 156 10*3/uL (ref 150–400)
RBC: 4.24 MIL/uL (ref 3.87–5.11)
RDW: 13.9 % (ref 11.5–15.5)
WBC: 5.9 10*3/uL (ref 4.0–10.5)
nRBC: 0 % (ref 0.0–0.2)

## 2020-05-08 LAB — GLUCOSE, CAPILLARY
Glucose-Capillary: 124 mg/dL — ABNORMAL HIGH (ref 70–99)
Glucose-Capillary: 117 mg/dL — ABNORMAL HIGH (ref 70–99)
Glucose-Capillary: 124 mg/dL — ABNORMAL HIGH (ref 70–99)

## 2020-05-08 MED ORDER — AMLODIPINE BESYLATE 5 MG PO TABS
5.0000 mg | ORAL_TABLET | Freq: Every day | ORAL | Status: DC
  Administered 2020-05-08: 5 mg via ORAL
  Filled 2020-05-08: qty 1

## 2020-05-08 MED ORDER — AZITHROMYCIN 250 MG PO TABS
250.0000 mg | ORAL_TABLET | Freq: Every day | ORAL | 0 refills | Status: DC
Start: 2020-05-08 — End: 2020-06-01

## 2020-05-08 MED ORDER — CEFDINIR 300 MG PO CAPS
300.0000 mg | ORAL_CAPSULE | Freq: Two times a day (BID) | ORAL | 0 refills | Status: DC
Start: 2020-05-08 — End: 2020-06-01

## 2020-05-08 NOTE — TOC Initial Note (Signed)
Transition of Care [facility]) - Initial/Assessment Note    Patient Details  Name: [name] MRN: [id number] Date of Birth: Jul 13, 1941  Transition of Care (TOC) CM/SW Contact:    Joaquin Courts, RN Phone Number: 05/08/2020, 1:41 PM  Clinical Narrative:    CM spoke with patient.  Patient set-up with [facility] for HHPT/OT.                Expected Discharge Plan: [place] Barriers to Discharge: No Barriers Identified   Patient Goals and CMS Choice Patient states their goals for this hospitalization and ongoing recovery are:: to go home CMS Medicare.gov Compare Post Acute Care list provided to:: Patient Choice offered to / list presented to : Patient  Expected Discharge Plan and Services Expected Discharge Plan: [place]   Discharge Planning Services: CM Consult Post Acute Care Choice: [place] arrangements for the past 2 months: Single Family Home Expected Discharge Date: 05/08/20               DME Arranged: N/A DME Agency: NA       HH Arranged: PT, OT HH Agency: [place] Date [facility] Agency Contacted: 05/08/20 Time HH Agency Contacted: 69 Representative spoke with at [place]: Georgina Snell  Prior Living Arrangements/Services Living arrangements for the past 2 months: [place]   Patient language and need for interpreter reviewed:: Yes Do you feel safe going back to the place where you live?: Yes      Need for Family Participation in Patient Care: Yes (Comment) Care giver support system in place?: Yes (comment)   Criminal Activity/Legal Involvement Pertinent to Current Situation/Hospitalization: No - Comment as needed  Activities of Daily Living Home Assistive Devices/Equipment: CBG Meter ADL Screening (condition at time of admission) Patient's cognitive ability adequate to safely complete daily activities?: Yes Is the patient deaf or have difficulty hearing?: No Does the patient have difficulty  seeing, even when wearing glasses/contacts?: No Does the patient have difficulty concentrating, remembering, or making decisions?: No Patient able to express need for assistance with ADLs?: Yes Does the patient have difficulty dressing or bathing?: No Independently performs ADLs?: Yes (appropriate for developmental age) Does the patient have difficulty walking or climbing stairs?: Yes (secondary to weakness) Weakness of Legs: Both Weakness of Arms/Hands: None  Permission Sought/Granted                  Emotional Assessment Appearance:: Appears stated age Attitude/Demeanor/Rapport: Engaged Affect (typically observed): Accepting Orientation: : Oriented to Self, Oriented to Place, Oriented to  Time, Oriented to Situation   Psych Involvement: No (comment)  Admission diagnosis:  CAP (community acquired pneumonia) [J18.9] Generalized weakness [R53.1] Community acquired pneumonia of left lower lobe of lung [J18.9] Patient Active Problem List   Diagnosis Date Noted  . CAP (community acquired pneumonia) 05/06/2020  . Abdominal bloating 02/17/2019  . Urinary frequency 02/17/2019  . Uncontrolled type 2 diabetes mellitus with hyperglycemia ([place]) 02/17/2019  . Lightheaded 02/17/2019  . Constipation 06/26/2018  . Hyperlipidemia 08/23/2014  . Type 2 diabetes mellitus without complication ([place]) 76/72/0947  . Mixed hyperlipidemia 06/19/2014  . S/P urological surgery 09/30/2013  . Joint pain 09/30/2013  . UTI (urinary tract infection) 09/30/2013  . Cough 12/10/2012  . OA (osteoarthritis) of knee 01/05/2012  . 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 Vaginal discharge 08/02/2011  . Spasm of lumbar paraspinous muscle 06/17/2011  . Morbidly obese ([place]) 05/31/2011  . HIP PAIN,  LEFT, CHRONIC 11/09/2010  . CARPAL TUNNEL SYNDROME, RIGHT 10/14/2010  . KNEE PAIN, RIGHT, CHRONIC 10/14/2010  . LEFT BUNDLE BRANCH BLOCK 07/26/2010  . 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  FATIGUE 07/01/2010  . CHEST PAIN, ATYPICAL 07/01/2010  . EPIGASTRIC PAIN 07/01/2010  . SUPRAPUBIC PAIN 07/01/2010  . HYPERGLYCEMIA 07/01/2010  . CELLULITIS AND ABSCESS OF OTHER SPECIFIED SITE 02/15/2010  . LUMP OR MASS IN BREAST 01/10/2010  . DYSURIA, CHRONIC 03/09/2009  . GERD 10/06/2008  . Personal history of colonic polyps-adenoma 08/26/2008  . LOC OSTEOARTHROS NOT SPEC PRIM/SEC LOWER LEG 09/10/2007  . GOUT, UNSPECIFIED 09/09/2007  . DIVERTICULOSIS, COLON 09/09/2007  . DIVERTICULITIS, HX OF 09/09/2007  . HYPERLIPIDEMIA 06/03/2007  . Essential hypertension 06/03/2007   PCP:  [name], MD Pharmacy:   [facility] [place], [place] [address] Phone: [phone] Fax: [phone]     Social Determinants of Health (SDOH) Interventions    Readmission Risk Interventions No flowsheet data found.

## 2020-05-08 NOTE — Progress Notes (Signed)
Pt discharged to home with husband. Home Health PT/OT is set up for the pt. Discharge instructions and medication education were provided to the pt and the husband.

## 2020-05-08 NOTE — Evaluation (Signed)
Physical Therapy Evaluation Patient Details Name: [name] MRN: [id number] DOB: April 20, 1941 Today's Date: 05/08/2020   History of Present Illness  79 yo female admitted for LLL PNA on 7/8. PMH includes OA, DMII, UTI, HTN, HLD, osteoporosis, bilateral TKR.  Clinical Impression  Pt presents with LE weakness, decreased activity tolerance vs baseline, impaired dynamic balance evidenced by DGI score 15/24 indicating at risk for falls. Pt to benefit from acute PT to address deficits. Pt ambulated hallway distance with no AD, but noticeable unsteadiness when balance challenged this day. VSS during mobility, with no DOE noted. PT to benefit from HHPT to address fall risk and improve mobility to PLOF. PT to progress mobility as tolerated, and will continue to follow acutely.      Follow Up Recommendations Home health PT;Supervision for mobility/OOB    Equipment Recommendations  None recommended by PT    Recommendations for Other Services       Precautions / Restrictions Precautions Precautions: Fall Restrictions Weight Bearing Restrictions: No      Mobility  Bed Mobility Overal bed mobility: Needs Assistance Bed Mobility: Supine to Sit     Supine to sit: Min guard;HOB elevated     General bed mobility comments: for safety, use of bedrails to come to sitting.  Transfers Overall transfer level: Needs assistance Equipment used: None Transfers: Sit to/from Stand Sit to Stand: Supervision;From elevated surface         General transfer comment: for safety, slow to rise and steady.  Ambulation/Gait Ambulation/Gait assistance: Min guard Gait Distance (Feet): 300 Feet Assistive device: IV Pole;None Gait Pattern/deviations: Step-through pattern;Decreased stride length;Trunk flexed Gait velocity: decr   General Gait Details: Min guard for safety, especially during challenges to dynamic balance (see balance section). No overt LOB but generalized unsteadiness  noted.  Stairs Stairs:  (pt politely declined step Garment/textile technologist)          Wheelchair Mobility    Modified Rankin (Stroke Patients Only)       Balance Overall balance assessment: Needs assistance Sitting-balance support: No upper extremity supported;Feet supported Sitting balance-Leahy Scale: Good     Standing balance support: During functional activity Standing balance-Leahy Scale: Good Standing balance comment: requires close guard for safety, ambulates without AD                 Standardized Balance Assessment Standardized Balance Assessment : Dynamic Gait Index   Dynamic Gait Index Level Surface: Normal Change in Gait Speed: Mild Impairment Gait with Horizontal Head Turns: Mild Impairment Gait with Vertical Head Turns: Moderate Impairment Gait and Pivot Turn: Mild Impairment Step Over Obstacle: Mild Impairment Step Around Obstacles: Mild Impairment Steps: Moderate Impairment (inferred) Total Score: 15       Pertinent Vitals/Pain Pain Assessment: No/denies pain    Home Living Family/patient expects to be discharged to:: Private residence Living Arrangements: Spouse/significant other Available Help at Discharge: Family Type of Home: House Home Access: Stairs to enter Entrance Stairs-Rails: Right Entrance Stairs-Number of Steps: 3 Home Layout: One level Home Equipment: [place] - 4 wheels;Electric scooter;Hand held shower head      Prior Function Level of Independence: Independent               Hand Dominance   Dominant Hand: Right    Extremity/Trunk Assessment   Upper Extremity Assessment Upper Extremity Assessment: Overall WFL for tasks assessed    Lower Extremity Assessment Lower Extremity Assessment: Generalized weakness    Cervical / Trunk Assessment Cervical / Trunk Assessment: Normal  Communication   Communication: No difficulties  Cognition Arousal/Alertness: Awake/alert Behavior During Therapy: WFL for tasks  assessed/performed Overall Cognitive Status: Within Functional Limits for tasks assessed                                        General Comments General comments (skin integrity, edema, etc.): Pt demonstrates verbal understanding of use of flutter valve, PT spoke with pt and husband about pt having supervision during mobility    Exercises     Assessment/Plan    PT Assessment Patient needs continued PT services  PT Problem List Decreased strength;Decreased mobility;Decreased activity tolerance;Decreased balance;Decreased knowledge of use of DME;Decreased knowledge of precautions       PT Treatment Interventions Therapeutic activities;Gait training;Stair training;Functional mobility training;Neuromuscular re-education;Balance training;Therapeutic exercise;Patient/family education    PT Goals (Current goals can be found in the Care Plan section)  Acute Rehab PT Goals Patient Stated Goal: go to my birthday party PT Goal Formulation: With patient Time For Goal Achievement: 05/22/20 Potential to Achieve Goals: Good    Frequency Min 3X/week   Barriers to discharge        Co-evaluation               AM-PAC PT "6 Clicks" Mobility  Outcome Measure Help needed turning from your back to your side while in a flat bed without using bedrails?: A Little Help needed moving from lying on your back to sitting on the side of a flat bed without using bedrails?: A Little Help needed moving to and from a bed to a chair (including a wheelchair)?: A Little Help needed standing up from a chair using your arms (e.g., wheelchair or bedside chair)?: A Little Help needed to walk in hospital room?: A Little Help needed climbing 3-5 steps with a railing? : A Lot 6 Click Score: 17    End of Session   Activity Tolerance: Patient tolerated treatment well Patient left: in chair;with call bell/phone within reach;with family/visitor present Nurse Communication: Mobility status PT  Visit Diagnosis: Other abnormalities of gait and mobility (R26.89);Muscle weakness (generalized) (M62.81)    Time: 0034-9179 PT Time Calculation (min) (ACUTE ONLY): 24 min   Charges:   PT Evaluation $PT Eval Low Complexity: 1 Low PT Treatments $Gait Training: 8-22 mins      Tanishia Lemaster E, PT Acute Rehabilitation Services Pager [phone]  Office [phone]  Jazel Nimmons D Demetress Tift 05/08/2020, 11:29 AM

## 2020-05-08 NOTE — Discharge Summary (Signed)
Physician Discharge Summary  [name] EPP:[id number] DOB: 12-Aug-1941 DOA: 05/06/2020  PCP: [name], MD  Admit date: 05/06/2020 Discharge date: 05/08/2020  Admitted From: Home  Disposition:  Home with [facility]   Recommendations for Outpatient Follow-up:  1. Follow up with PCP Dr. Baird Cancer in 1 week 2. Dr. Baird Cancer: Please recheck blood pressure in 1 week and restart Tenoretic as needed     Home Health: PT/OT due to ongoing balance issues  Equipment/Devices: CBG meter  Discharge Condition: Good  CODE STATUS: FULL Diet recommendation: Low carb  Brief/Interim Summary: [name] is a 79 y.o. F with obesity, Diet controlled DM, HTN and gout who presented with back pain and cough/fever.  The patient was in her USOH until recently, developed back pain and malaise/weakness.  Seen by her PCP, started on Macrobid for UTI.  Symptoms progressed, family noted she had a cough and seemed out of breath with exertion, so they brought her to the ER.  In the ER, chest x-ray showed a left base opacity, lactate 2.5, she was febrile to 101, leukocytosis 15 K.  Started on antibiotics for pneumonia.     PRINCIPAL HOSPITAL DIAGNOSIS: Sepsis due to pneumonia    Discharge Diagnoses:   Sepsis due to left lower lobe community-acquired pneumonia Patient presented with leukocytosis, fever, lactate 2.5, qSofa 1 due to BP <606 mmHg systolic.  She was given IV fluids, ceftriaxone, and azithromycin.  Patient now mentating at baseline, taking orals.  Temp < 100 F, heart rate < 100bpm, RR < 24, SpO2 at baseline.   Stable for discharge to complete 5 days antibiotics.     Type 2 diabetes, diet controlled, without complication  Hypertension Blood pressure soft after discharge.  Mentation good and clinically improving, suspected combination of dehydration and antihypertensives.  Amlodipine was resumed, but Tenoretic held at d/c.  Close follow up with PCP.   Gout  Obesity BMI 35 with  comorbidities  Hypomagnesemia Supplemented, resolved           Discharge Instructions  Discharge Instructions    Discharge instructions   Complete by: As directed    From Dr. Loleta Books: You were admitted for pneumonia. You were treated with antibiotics (Rocephin and azithromycin) You should complete the course by taking: Take cefdinir/Omnicef 300 mg (1 tab) twice daily for 4 more days Take azithromycin/Zithromax 250 mg (1 tab) once daily for 2 more days (azithromycin has a longer action, lasts longer in your system)   Use the flutter valve (the green kazoo) several times per day Stay in a chair and moving around the house as much as you are comfortable  Use a cough syrup like Robitussin with dextromethorphan-guiafenesin as needed for cough Avoid products with pseudoephedrine or phenylephrine in the ingredients  Go see your primary care doctor in 1 week   Resume all your normal home medicines with 1 exception: -For now only take your Lotrel (amlodipine/benazepril) and do not take your Tenoretic (atenolol/chlorthalidone). -If you check your blood pressure at home and it is elevated, you may restart the Tenoretic, otherwise wait until you see your primary care doctor   Increase activity slowly   Complete by: As directed      Allergies as of 05/08/2020      Reactions   Atorvastatin Calcium Itching   Bee Venom Hives   Oxycodone Other (See Comments)   hallucinations   Vicodin [hydrocodone-acetaminophen] Other (See Comments)   hallucinations      Medication List    STOP taking these medications  nitrofurantoin (macrocrystal-monohydrate) 100 MG capsule Commonly known as: Macrobid     TAKE these medications   allopurinol 300 MG tablet Commonly known as: ZYLOPRIM TAKE 1 TABLET(300 MG) BY MOUTH DAILY What changed: See the new instructions.   amLODipine-benazepril 5-10 MG capsule Commonly known as: LOTREL TAKE 1 CAPSULE BY MOUTH EVERY DAY What changed: how  much to take   aspirin EC 81 MG tablet Take 1 tablet (81 mg total) by mouth daily.   atenolol-chlorthalidone 50-25 MG tablet Commonly known as: TENORETIC TAKE 1 TABLET BY MOUTH EVERY DAY AT 11 AM What changed:   how much to take  how to take this  when to take this  additional instructions   azithromycin 250 MG tablet Commonly known as: Zithromax Z-Pak Take 1 tablet (250 mg total) by mouth daily.   cefdinir 300 MG capsule Commonly known as: OMNICEF Take 1 capsule (300 mg total) by mouth 2 (two) times daily.   OneTouch Delica Lancets 86P Misc Use to check blood glucose once daily   OneTouch Verio test strip Generic drug: glucose blood USE AS DIRECTED TO TEST BLOOD SUGAR BID   pantoprazole 40 MG tablet Commonly known as: PROTONIX TAKE 1 TABLET(40 MG) BY MOUTH DAILY What changed: See the new instructions.   PROBIOTIC PO Take 3 capsules by mouth daily.   rosuvastatin 20 MG tablet Commonly known as: CRESTOR TAKE 1 TABLET(20 MG) BY MOUTH DAILY What changed: See the new instructions.   Trulicity 6.19 JK/9.3OI Sopn Generic drug: Dulaglutide INJECT 0.5 ML(0.75 MG) SUBCUTANEOUSLY EVERY WEEK IN ABDOMEN, THIGH OR UPPER ARM ROTATING INJECTION SITE What changed:   how much to take  how to take this  when to take this  additional instructions   ZINC PO Take 1 tablet by mouth daily.       Follow-up Information    Care, [facility] Follow up.   Specialty: Home Health Services Why: agency will provide home health physical and occupational therapy. Contact information: [place] [address] [phone]              Allergies  Allergen Reactions  . Atorvastatin Calcium Itching  . Bee Venom Hives  . Oxycodone Other (See Comments)    hallucinations  . Vicodin [Hydrocodone-Acetaminophen] Other (See Comments)    hallucinations    Consultations:     Procedures/Studies: DG Chest 2 View  Result Date:  05/06/2020 CLINICAL DATA:  Fever. EXAM: CHEST - 2 VIEW COMPARISON:  07/13/2017. FINDINGS: Mediastinum and hilar structures normal. Left base infiltrate cannot be excluded. Stable elevation right hemidiaphragm with mild right base subsegmental atelectasis again noted. No pleural effusion or pneumothorax. Heart size stable. No pulmonary venous congestion. IMPRESSION: 1.  Left base infiltrate cannot be excluded. 2. Stable elevation right hemidiaphragm with right base subsegmental atelectasis. Electronically Signed   By: Marcello Moores  Register   On: 05/06/2020 06:33       Subjective: Feeling better.  Back pain improved.  No vomiting, confusion.  No dyspnea.  No swelling or orthopnea.  Discharge Exam: Vitals:   05/08/20 0627 05/08/20 1021  BP: (!) 157/67 139/66  Pulse: 73 76  Resp: 18   Temp: 98.4 F (36.9 C)   SpO2: 97%    Vitals:   05/07/20 1429 05/07/20 2133 05/08/20 0627 05/08/20 1021  BP: 109/79 (!) 138/59 (!) 157/67 139/66  Pulse: 77 65 73 76  Resp: (!) 22 20 18    Temp: 97.9 F (36.6 C) 98.1 F (36.7 C) 98.4  F (36.9 C)   TempSrc: Oral     SpO2: 98% 97% 97%   Weight:      Height:        General: Pt is alert, awake, not in acute distress Cardiovascular: RRR, nl S1-S2, no murmurs appreciated.   No LE edema.   Respiratory: Normal respiratory rate and rhythm.  CTAB without rales or wheezes. Abdominal: Abdomen soft and non-tender.  No distension or HSM.   Neuro/Psych: Strength symmetric in upper and lower extremities.  Judgment and insight appear normal.   The results of significant diagnostics from this hospitalization (including imaging, microbiology, ancillary and laboratory) are listed below for reference.     Microbiology: Recent Results (from the past 240 hour(s))  Culture, Urine     Status: Abnormal   Collection Time: 05/04/20  5:35 PM   Specimen: Urine   UR  Result Value Ref Range Status   Urine Culture, Routine Final report (A)  Final   Organism ID, Bacteria  Klebsiella pneumoniae (A)  Final    Comment: Greater than 100,000 colony forming units per mL Cefazolin <=4 ug/mL Cefazolin with an MIC <=16 predicts susceptibility to the oral agents cefaclor, cefdinir, cefpodoxime, cefprozil, cefuroxime, cephalexin, and loracarbef when used for therapy of uncomplicated urinary tract infections due to E. coli, Klebsiella pneumoniae, and Proteus mirabilis.    Antimicrobial Susceptibility Comment  Final    Comment:       ** S = Susceptible; I = Intermediate; R = Resistant **                    P = Positive; N = Negative             MICS are expressed in micrograms per mL    Antibiotic                 RSLT#1    RSLT#2    RSLT#3    RSLT#4 Amoxicillin/Clavulanic Acid    S Ampicillin                     R Cefepime                       S Ceftriaxone                    S Cefuroxime                     S Ciprofloxacin                  S Ertapenem                      S Gentamicin                     S Imipenem                       S Levofloxacin                   S Meropenem                      S Nitrofurantoin                 I Piperacillin/Tazobactam        S Tetracycline  S Tobramycin                     S Trimethoprim/Sulfa             S   Blood culture (routine x 2)     Status: None (Preliminary result)   Collection Time: 05/06/20  4:20 AM   Specimen: BLOOD  Result Value Ref Range Status   Specimen Description   Final    BLOOD RIGHT ARM Performed at [facility] [address], [place], [place] [address]    Special Requests   Final    BOTTLES DRAWN AEROBIC AND ANAEROBIC Blood Culture adequate volume Performed at [facility] [address] [place], [place] [address]    Culture   Final    NO GROWTH 1 DAY Performed at [facility], [place] [address], [place], [place] [address]    Report Status PENDING  Incomplete  Urine culture     Status: None   Collection Time: 05/06/20   6:00 AM   Specimen: Urine, Random  Result Value Ref Range Status   Specimen Description   Final    URINE, RANDOM Performed at [facility] [address], [place], [place] [address]    Special Requests   Final    NONE Performed at [facility], [place] [address] [place], [place] [address]    Culture   Final    NO GROWTH Performed at [facility], [place] [address] [place], [place] [address]    Report Status 05/07/2020 FINAL  Final  SARS Coronavirus 2 by RT PCR (hospital order, performed in [facility] hospital lab) Nasopharyngeal Nasopharyngeal Swab     Status: None   Collection Time: 05/06/20  8:56 AM   Specimen: Nasopharyngeal Swab  Result Value Ref Range Status   SARS Coronavirus 2 NEGATIVE NEGATIVE Final    Comment: (NOTE) SARS-CoV-2 target nucleic acids are NOT DETECTED.  The SARS-CoV-2 RNA is generally detectable in upper and lower respiratory specimens during the acute phase of infection. The lowest concentration of SARS-CoV-2 viral copies this assay can detect is 250 copies / mL. A negative result does not preclude SARS-CoV-2 infection and should not be used as the sole basis for treatment or other patient management decisions.  A negative result may occur with improper specimen collection / handling, submission of specimen other than nasopharyngeal swab, presence of viral mutation(s) within the areas targeted by this assay, and inadequate number of viral copies (<250 copies / mL). A negative result must be combined with clinical observations, patient history, and epidemiological information.  Fact Sheet for Patients:   StrictlyIdeas.no  Fact Sheet for Healthcare Providers: BankingDealers.co.za  This test is not yet approved or  cleared by the [place] FDA and has been authorized for detection and/or diagnosis of SARS-CoV-2 by FDA under an Emergency Use Authorization (EUA).   This EUA will remain in effect (meaning this test can be used) for the duration of the COVID-19 declaration under Section 564(b)(1) of the Act, 21 U.S.C. section 360bbb-3(b)(1), unless the authorization is terminated or revoked sooner.  Performed at [facility], [place] [address] [place], [place] [address]   Blood culture (routine x 2)     Status: None (Preliminary result)   Collection Time: 05/06/20  3:37 PM   Specimen: BLOOD  Result Value Ref Range Status   Specimen Description   Final    BLOOD LEFT ARM  Performed at [facility], [place] [address], [place], [place] [address]    Special Requests   Final    BOTTLES DRAWN AEROBIC AND ANAEROBIC Blood Culture adequate volume Performed at [facility] [address] [place], [place] [address]    Culture   Final    NO GROWTH < 24 HOURS Performed at [facility] [address], [place], [place] [address]    Report Status PENDING  Incomplete     Labs: BNP (last 3 results) No results for input(s): BNP in the last 8760 hours. Basic Metabolic Panel: Recent Labs  Lab 05/06/20 0420 05/07/20 0315 05/08/20 0407  NA 137 136 135  K 3.3* 3.1* 3.6  CL 100 100 105  CO2 22 26 24   GLUCOSE 173* 123* 129*  BUN 22 18 23   CREATININE 1.13* 1.23* 1.20*  CALCIUM 9.0 8.4* 8.5*  MG 1.5* 1.7  --    Liver Function Tests: Recent Labs  Lab 05/06/20 0420 05/07/20 0315 05/08/20 0407  AST 41 76* 73*  ALT 31 48* 52*  ALKPHOS 47 36* 37*  BILITOT 1.4* 1.8* 1.3*  PROT 7.0 6.2* 6.0*  ALBUMIN 3.8 3.0* 3.1*   No results for input(s): LIPASE, AMYLASE in the last 168 hours. No results for input(s): AMMONIA in the last 168 hours. CBC: Recent Labs  Lab 05/06/20 0420 05/08/20 0407  WBC 15.5* 5.9  NEUTROABS 14.4*  --   HGB 14.4 12.7  HCT 42.7 38.6  MCV 91.6 91.0  PLT 194 156   Cardiac Enzymes: No results for input(s): CKTOTAL, CKMB, CKMBINDEX, TROPONINI in the last 168  hours. BNP: Invalid input(s): POCBNP CBG: Recent Labs  Lab 05/07/20 1202 05/07/20 1655 05/08/20 0628 05/08/20 0831 05/08/20 1225  GLUCAP 185* 132* 124* 117* 124*   D-Dimer No results for input(s): DDIMER in the last 72 hours. Hgb A1c Recent Labs    05/06/20 0420  HGBA1C 6.7*   Lipid Profile No results for input(s): CHOL, HDL, LDLCALC, TRIG, CHOLHDL, LDLDIRECT in the last 72 hours. Thyroid function studies No results for input(s): TSH, T4TOTAL, T3FREE, THYROIDAB in the last 72 hours.  Invalid input(s): FREET3 Anemia work up No results for input(s): VITAMINB12, FOLATE, FERRITIN, TIBC, IRON, RETICCTPCT in the last 72 hours. Urinalysis    Component Value Date/Time   COLORURINE YELLOW 05/06/2020 0600   APPEARANCEUR CLEAR 05/06/2020 0600   LABSPEC 1.011 05/06/2020 0600   PHURINE 5.0 05/06/2020 0600   GLUCOSEU NEGATIVE 05/06/2020 0600   HGBUR NEGATIVE 05/06/2020 0600   HGBUR negative 07/18/2010 0915   BILIRUBINUR NEGATIVE 05/06/2020 0600   BILIRUBINUR negative 05/04/2020 1552   KETONESUR NEGATIVE 05/06/2020 0600   PROTEINUR NEGATIVE 05/06/2020 0600   UROBILINOGEN 1.0 05/04/2020 1552   UROBILINOGEN 1.0 01/24/2015 0131   NITRITE NEGATIVE 05/06/2020 0600   LEUKOCYTESUR NEGATIVE 05/06/2020 0600   Sepsis Labs Invalid input(s): PROCALCITONIN,  WBC,  LACTICIDVEN Microbiology Recent Results (from the past 240 hour(s))  Culture, Urine     Status: Abnormal   Collection Time: 05/04/20  5:35 PM   Specimen: Urine   UR  Result Value Ref Range Status   Urine Culture, Routine Final report (A)  Final   Organism ID, Bacteria Klebsiella pneumoniae (A)  Final    Comment: Greater than 100,000 colony forming units per mL Cefazolin <=4 ug/mL Cefazolin with an MIC <=16 predicts susceptibility to the oral agents cefaclor, cefdinir, cefpodoxime, cefprozil, cefuroxime, cephalexin, and loracarbef when used for therapy of uncomplicated urinary tract infections due  to E. coli, Klebsiella  pneumoniae, and Proteus mirabilis.    Antimicrobial Susceptibility Comment  Final    Comment:       ** S = Susceptible; I = Intermediate; R = Resistant **                    P = Positive; N = Negative             MICS are expressed in micrograms per mL    Antibiotic                 RSLT#1    RSLT#2    RSLT#3    RSLT#4 Amoxicillin/Clavulanic Acid    S Ampicillin                     R Cefepime                       S Ceftriaxone                    S Cefuroxime                     S Ciprofloxacin                  S Ertapenem                      S Gentamicin                     S Imipenem                       S Levofloxacin                   S Meropenem                      S Nitrofurantoin                 I Piperacillin/Tazobactam        S Tetracycline                   S Tobramycin                     S Trimethoprim/Sulfa             S   Blood culture (routine x 2)     Status: None (Preliminary result)   Collection Time: 05/06/20  4:20 AM   Specimen: BLOOD  Result Value Ref Range Status   Specimen Description   Final    BLOOD RIGHT ARM Performed at [facility] [address] [place], [place] [address]    Special Requests   Final    BOTTLES DRAWN AEROBIC AND ANAEROBIC Blood Culture adequate volume Performed at [facility] [address] [place], [place] [address]    Culture   Final    NO GROWTH 1 DAY Performed at [facility], [place] [address], [place], [place] [address]    Report Status PENDING  Incomplete  Urine culture     Status: None   Collection Time: 05/06/20  6:00 AM   Specimen: Urine, Random  Result Value Ref Range Status   Specimen Description   Final    URINE, RANDOM Performed at [facility]  [address] [place], [place] [address]    Special Requests   Final    NONE Performed at [facility], [place] [address] [place], [place] [address]    Culture   Final    NO  GROWTH Performed at [facility], [place] [address] [place], [place] [address]    Report Status 05/07/2020 FINAL  Final  SARS Coronavirus 2 by RT PCR (hospital order, performed in [facility] hospital lab) Nasopharyngeal Nasopharyngeal Swab     Status: None   Collection Time: 05/06/20  8:56 AM   Specimen: Nasopharyngeal Swab  Result Value Ref Range Status   SARS Coronavirus 2 NEGATIVE NEGATIVE Final    Comment: (NOTE) SARS-CoV-2 target nucleic acids are NOT DETECTED.  The SARS-CoV-2 RNA is generally detectable in upper and lower respiratory specimens during the acute phase of infection. The lowest concentration of SARS-CoV-2 viral copies this assay can detect is 250 copies / mL. A negative result does not preclude SARS-CoV-2 infection and should not be used as the sole basis for treatment or other patient management decisions.  A negative result may occur with improper specimen collection / handling, submission of specimen other than nasopharyngeal swab, presence of viral mutation(s) within the areas targeted by this assay, and inadequate number of viral copies (<250 copies / mL). A negative result must be combined with clinical observations, patient history, and epidemiological information.  Fact Sheet for Patients:   StrictlyIdeas.no  Fact Sheet for Healthcare Providers: BankingDealers.co.za  This test is not yet approved or  cleared by the [place] FDA and has been authorized for detection and/or diagnosis of SARS-CoV-2 by FDA under an Emergency Use Authorization (EUA).  This EUA will remain in effect (meaning this test can be used) for the duration of the COVID-19 declaration under Section 564(b)(1) of the Act, 21 U.S.C. section 360bbb-3(b)(1), unless the authorization is terminated or revoked sooner.  Performed at [facility], [place] [address] [place], [place] [address]   Blood culture (routine x  2)     Status: None (Preliminary result)   Collection Time: 05/06/20  3:37 PM   Specimen: BLOOD  Result Value Ref Range Status   Specimen Description   Final    BLOOD LEFT ARM Performed at [facility] [address], [place], [place] [address]    Special Requests   Final    BOTTLES DRAWN AEROBIC AND ANAEROBIC Blood Culture adequate volume Performed at [facility] [address] [place], [place] [address]    Culture   Final    NO GROWTH < 24 HOURS Performed at [facility] [address], [place], [place] [address]    Report Status PENDING  Incomplete     Time coordinating discharge: 25 minutes The [place] controlled substances registry was reviewed for this patient        SIGNED:   Edwin Dada, MD  [facility] 05/08/2020, 2:38 PM

## 2020-05-10 ENCOUNTER — Telehealth: Payer: Self-pay

## 2020-05-10 ENCOUNTER — Ambulatory Visit: Payer: Self-pay

## 2020-05-10 ENCOUNTER — Telehealth: Payer: Medicare Other

## 2020-05-10 DIAGNOSIS — E1122 Type 2 diabetes mellitus with diabetic chronic kidney disease: Secondary | ICD-10-CM

## 2020-05-10 DIAGNOSIS — N183 Chronic kidney disease, stage 3 unspecified: Secondary | ICD-10-CM

## 2020-05-10 NOTE — Chronic Care Management (AMB) (Deleted)
Chronic Care Management Pharmacy  Name: [name]  MRN: [id number] DOB: [birth year]  Chief Complaint/ HPI  [name],  79 y.o. , female presents for their Initial CCM visit with the clinical pharmacist {CHL HP Upstream Pharm visit YWVP:[id number]}.  PCP : [name], MD  Their chronic conditions include: HLD, Gout, HTN, GERD, Knee/Hip pain, OA, Constipation, T2DM.   Office Visits: 05/04/2020 - UTI - Positive nitrated in urine - rocephin injection and macrobid bid x5d. Increase water intake and cranberry juice.   03/22/2020 - referral to ortho for chronic hip/knee pain. Patient advised to follow anti-inflammatory diet. Patient may benefit from orthotics - refer to podiatry. Ordered ANA, IFA (with reflex), Cyclic citrul peptide antibody, IGG/IGA, Rheumatoid factor, Sedimentation rate, Uric acid, Vitamin b12, TSH, RPR.   01/05/2020 - Right hip pain - toradol injection administered. Ordered A1c, CMP14+EGFR, uric acid and lipid panel.   12/31/2019 - AWV   Consult Visits: 05/06/2020 - Hospital admission for CAP.   CCM Visits: 05/07/2020 - SW - patient admitted to hospital for CAP. CCM team will follow upon discharge.   02/17/2020 - RN - patient does not wish to transfer to pill package system at this time but will alert CCM team when ready. Patient not interested in increasing Trulicity at this time.   01/06/2020 - PharmD - met patient in clinic and encouraged to continue exercise and water intake. Pill box given to help with compliance. Patient inquired about immune support vitamin - pharmacist recommended a multivitamin with zinc.   11/26/2019 - PharmD - Counseled on injection technique.   11/19/2019 - SW - coordinated dental care/coverage.   46/27/0350 - CPhT - Trulicity approved thru [employer] until 10/29/2020.  Medications: Outpatient Encounter Medications as of 05/10/2020  Medication Sig Note  . allopurinol (ZYLOPRIM) 300 MG tablet TAKE 1 TABLET(300 MG) BY  MOUTH DAILY (Patient taking differently: Take 300 mg by mouth daily. )   . amLODipine-benazepril (LOTREL) 5-10 MG capsule TAKE 1 CAPSULE BY MOUTH EVERY DAY (Patient taking differently: Take 1 capsule by mouth daily. )   . aspirin EC 81 MG tablet Take 1 tablet (81 mg total) by mouth daily.   Marland Kitchen atenolol-chlorthalidone (TENORETIC) 50-25 MG tablet TAKE 1 TABLET BY MOUTH EVERY DAY AT 11 AM (Patient taking differently: Take 1 tablet by mouth daily. At 11am)   . azithromycin (ZITHROMAX Z-PAK) 250 MG tablet Take 1 tablet (250 mg total) by mouth daily.   . cefdinir (OMNICEF) 300 MG capsule Take 1 capsule (300 mg total) by mouth 2 (two) times daily.   . Dulaglutide (TRULICITY) 0.93 GH/8.2XH SOPN INJECT 0.5 ML(0.75 MG) SUBCUTANEOUSLY EVERY WEEK IN ABDOMEN, THIGH OR UPPER ARM ROTATING INJECTION SITE (Patient taking differently: Inject 0.75 mg into the skin once a week. On Fridays)   . Multiple Vitamins-Minerals (ZINC PO) Take 1 tablet by mouth daily.   [name] DELICA LANCETS 37J MISC Use to check blood glucose once daily   . ONETOUCH VERIO test strip USE AS DIRECTED TO TEST BLOOD SUGAR BID   . pantoprazole (PROTONIX) 40 MG tablet TAKE 1 TABLET(40 MG) BY MOUTH DAILY (Patient taking differently: Take 40 mg by mouth daily. )   . Probiotic Product (PROBIOTIC PO) Take 3 capsules by mouth daily.   . rosuvastatin (CRESTOR) 20 MG tablet TAKE 1 TABLET(20 MG) BY MOUTH DAILY (Patient taking differently: Take 20 mg by mouth daily. )   . [DISCONTINUED] nitrofurantoin, macrocrystal-monohydrate, (MACROBID) 100 MG capsule Take 1 capsule (100 mg total) by mouth  2 (two) times daily for 5 days. 05/06/2020: Has completed 2/5 days of therapy  . [DISCONTINUED] acetaminophen (TYLENOL) suppository 650 mg    . [DISCONTINUED] acetaminophen (TYLENOL) tablet 650 mg    . [DISCONTINUED] allopurinol (ZYLOPRIM) tablet 300 mg    . [DISCONTINUED] aspirin EC tablet 81 mg    . [DISCONTINUED] azithromycin (ZITHROMAX) 500 mg in sodium chloride  0.9 % 250 mL IVPB    . [DISCONTINUED] cefTRIAXone (ROCEPHIN) 1 g in sodium chloride 0.9 % 100 mL IVPB    . [DISCONTINUED] enoxaparin (LOVENOX) injection 40 mg    . [DISCONTINUED] insulin aspart (novoLOG) injection 0-9 Units    . [DISCONTINUED] pantoprazole (PROTONIX) EC tablet 40 mg    . [DISCONTINUED] polyethylene glycol (MIRALAX / GLYCOLAX) packet 17 g    . [DISCONTINUED] potassium chloride SA (KLOR-CON) CR tablet 40 mEq    . [DISCONTINUED] rosuvastatin (CRESTOR) tablet 20 mg    . [DISCONTINUED] sodium chloride flush (NS) 0.9 % injection 3 mL     No facility-administered encounter medications on file as of 05/10/2020.   Allergies  Allergen Reactions  . Atorvastatin Calcium Itching  . Bee Venom Hives  . Oxycodone Other (See Comments)    hallucinations  . Vicodin [Hydrocodone-Acetaminophen] Other (See Comments)    hallucinations   SDOH Screenings   Alcohol Screen:   . Last Alcohol Screening Score (AUDIT):   Depression (PHQ2-9): Low Risk   . PHQ-2 Score: 0  Financial Resource Strain: High Risk  . Difficulty of Paying Living Expenses: Hard  Food Insecurity: No Food Insecurity  . Worried About Charity fundraiser in the Last Year: Never true  . Ran Out of Food in the Last Year: Never true  Housing:   . Last Housing Risk Score:   Physical Activity: Inactive  . Days of Exercise per Week: 0 days  . Minutes of Exercise per Session: 0 min  Social Connections:   . Frequency of Communication with Friends and Family:   . Frequency of Social Gatherings with Friends and Family:   . Attends Religious Services:   . Active Member of Clubs or Organizations:   . Attends Archivist Meetings:   Marland Kitchen Marital Status:   Stress: No Stress Concern Present  . Feeling of Stress : Not at all  Tobacco Use: Low Risk   . Smoking Tobacco Use: Never Smoker  . Smokeless Tobacco Use: Never Used  Transportation Needs: No Transportation Needs  . Lack of Transportation (Medical): No  . Lack of  Transportation (Non-Medical): No     Current Diagnosis/Assessment:  Goals Addressed   None     Hyperlipidemia   LDL goal < ***  Lipid Panel     Component Value Date/Time   CHOL 182 01/05/2020 1617   TRIG 249 (H) 01/05/2020 1617   HDL 39 (L) 01/05/2020 1617   LDLCALC 101 (H) 01/05/2020 1617   LDLDIRECT 162.2 11/21/2013 1135    Hepatic Function Latest Ref Rng & Units 05/08/2020 05/07/2020 05/06/2020  Total Protein 6.5 - 8.1 g/dL 6.0(L) 6.2(L) 7.0  Albumin 3.5 - 5.0 g/dL 3.1(L) 3.0(L) 3.8  AST 15 - 41 U/L 73(H) 76(H) 41  ALT 0 - 44 U/L 52(H) 48(H) 31  Alk Phosphatase 38 - 126 U/L 37(L) 36(L) 47  Total Bilirubin 0.3 - 1.2 mg/dL 1.3(H) 1.8(H) 1.4(H)  Bilirubin, Direct 0.1 - 0.5 mg/dL - - -     The 10-year ASCVD risk score Mikey Bussing DC Jr., et al., 2013) is: 32%   Values  used to calculate the score:     Age: 40 years     Sex: Female     Is Non-Hispanic African American: Yes     Diabetic: Yes     Tobacco smoker: No     Systolic Blood Pressure: 665 mmHg     Is BP treated: Yes     HDL Cholesterol: 39 mg/dL     Total Cholesterol: 182 mg/dL   Patient has failed these meds in past: *** Patient is currently {CHL Controlled/[phone]} on the following medications:  . Aspirin ec 81 mg daily . Rosuvastatin 20 mg daily  We discussed:  {CHL HP Upstream Pharmacy discussion:[phone]}  Plan  Continue {CHL HP Upstream Pharmacy Plans:[phone]}   Diabetes   Recent Relevant Labs: Lab Results  Component Value Date/Time   HGBA1C 6.7 (H) 05/06/2020 04:20 AM   HGBA1C 6.2 (H) 01/05/2020 04:17 PM   MICROALBUR 10 03/19/2019 02:04 PM    Kidney Function Lab Results  Component Value Date/Time   CREATININE 1.20 (H) 05/08/2020 04:07 AM   CREATININE 1.23 (H) 05/07/2020 03:15 AM   GFR 53.18 (L) 06/19/2014 03:42 PM   GFRNONAA 43 (L) 05/08/2020 04:07 AM   GFRAA 50 (L) 05/08/2020 04:07 AM   K 3.6 05/08/2020 04:07 AM   K 3.1 (L) 05/07/2020 03:15 AM    Checking BG: {CHL HP  Blood Glucose Monitoring [phone]}  Recent FBG Readings: Recent pre-meal BG readings: *** Recent 2hr PP BG readings:  *** Recent HS BG readings: *** Patient has failed these meds in past: Trulicity 9.93 mg sq weekly, One touch Verio test strips twice daily Patient is currently {CHL Controlled/[phone]} on the following medications: ***  Last diabetic Foot exam: No results found for: HMDIABEYEEXA  Last diabetic Eye exam: No results found for: HMDIABFOOTEX   We discussed: {CHL HP Upstream Pharmacy discussion:[phone]}  Plan  Continue {CHL HP Upstream Pharmacy Plans:[phone]},  Hypertension   BP today is:  {CHL HP UPSTREAM Pharmacist BP ranges:[phone]}  Office blood pressures are  BP Readings from Last 3 Encounters:  05/08/20 139/66  05/04/20 130/74  03/22/20 130/78    Patient has failed these meds in the past: amlodipine,  atenolol,  Patient is currently controlled/uncontrolled*** on the following medications: amlodipine-benazepril 5-10 mg daily, atenolol-chlorthalidone 50-25 mg daily at 11 am  Patient checks BP at home {CHL HP BP Monitoring [phone]}  Patient home BP readings are ranging: ***  We discussed {CHL HP Upstream Pharmacy discussion:[phone]}  Plan  Continue {CHL HP Upstream Pharmacy Plans:[phone]}     and  Other Diagnosis:GERD    Patient has failed these meds in past: *** Patient is currently {CHL Controlled/[phone]} on the following medications: pantoprazole 40 mg daily  We discussed:  {CHL HP Upstream Pharmacy discussion:[phone]}  Plan  Continue {CHL HP Upstream Pharmacy Plans:[phone]}   ***Gout   Patient has failed these meds in past: indomethacin Patient is currently {CHL Controlled/[phone]} on the following medications:  . Allopurinol 300 mg daily  We discussed:  ***  Plan  Continue {CHL HP Upstream Pharmacy Plans:[phone]}   Osteopenia /  Osteoporosis   Last DEXA Scan: not listed  T-Score femoral neck: ***  T-Score total hip: ***  T-Score lumbar spine: ***  T-Score forearm radius: ***  10-year probability of major osteoporotic fracture: ***  10-year probability of hip fracture: ***  No results found for: VD25OH   Patient {is;is not an osteoporosis candidate:23886}  Patient has failed these meds in past: *** Patient is currently {CHL Controlled/[phone]} on the  following medications:  . ***  We discussed:  {Osteoporosis Counseling:23892}  Plan  Continue {CHL HP Upstream Pharmacy Plans:[phone]}  Health Maintenance   Patient is currently {CHL Controlled/Uncontrolled:[phone]} on the following medications:  . Probiotic 3 capsules daily - *** . Multivamins-minerals (Zinc PO) daily - ***  We discussed:  ***  Plan  Continue {CHL HP Upstream Pharmacy YKZLD:[id number]}  Vaccines   Reviewed and discussed patient's vaccination history.    Immunization History  Administered Date(s) Administered  . Influenza Split 07/25/2012  . Influenza, High Dose Seasonal PF 10/08/2018, 10/02/2019  . Influenza,inj,Quad PF,6+ Mos 11/21/2013  . Influenza-Unspecified 07/25/2012, 11/21/2013  . Moderna SARS-COVID-2 Vaccination 12/03/2019, 01/01/2020  . Pneumococcal Polysaccharide-23 11/21/2013  . Pneumococcal-Unspecified 11/21/2013  . Td 01/29/1999, 11/21/2013    Plan  Recommended patient receive *** vaccine in *** office.   Medication Management   Pt uses *** pharmacy for all medications Uses pill box? {Yes or If no, why not?:20788} Pt endorses ***% compliance  We discussed: ***  Plan  {[facility] Pharm[facility] QZES:92330}    Follow up: *** month phone visit  ***

## 2020-05-10 NOTE — Telephone Encounter (Signed)
Attempted to do TCM call. Pt stated that she is too weak to do it right now. Told pt that I would call her back around 545pm

## 2020-05-10 NOTE — Chronic Care Management (AMB) (Signed)
  Care Management    Consult Note  05/10/2020 Name: [name] MRN: [id number] DOB: 04/09/1941  Care management team received notification of patient's recent inpatient hospitalization related to community acquired pneumonia .Based on review of health record, [name] is currently active in the embedded care coordination program.   Review of patient status, including review of consultants reports, relevant laboratory and other test results, and collaboration with appropriate care team members and the patient's provider was performed as part of comprehensive patient evaluation and provision of chronic care management services.    SW reviewed hospital discharge summary to note patient discharged home with home health orders for PT/OT. See below for discharge instructions:  Discharge instructions From Dr. Loleta Books: You were admitted for pneumonia. You were treated with antibiotics (Rocephin and azithromycin) You should complete the course by taking: Take cefdinir/Omnicef 300 mg (1 tab) twice daily for 4 more days Take azithromycin/Zithromax 250 mg (1 tab) once daily for 2 more days (azithromycin has a longer action, lasts longer in your system) Use the flutter valve (the green kazoo) several times per day Stay in a chair and moving around the house as much as you are comfortable Use a cough syrup like Robitussin with dextromethorphan-guiafenesin as needed for cough Avoid products with pseudoephedrine or phenylephrine in the ingredients Go see your primary care doctor in 1 week Resume all your normal home medicines with 1 exception: -For now only take your Lotrel (amlodipine/benazepril) and do not take your Tenoretic (atenolol/ chlorthalidone). -If you check your blood pressure    Plan: Sw collaboration with embedded PharmD and Consulting civil engineer. SW will not follow up with the patient at this time as patient has scheduled PharmD appointment today, 7/12. Next RN Care Manager outreach  scheduled for 7/23. SW is available to assist with any identified care coordination needs.  [name], BSW, CDP Social Worker, Certified Dementia Practitioner [place] [facility] [place] [facility] [phone]

## 2020-05-11 ENCOUNTER — Telehealth: Payer: Self-pay

## 2020-05-11 DIAGNOSIS — M25551 Pain in right hip: Secondary | ICD-10-CM

## 2020-05-11 DIAGNOSIS — Z96651 Presence of right artificial knee joint: Secondary | ICD-10-CM | POA: Insufficient documentation

## 2020-05-11 HISTORY — DX: Pain in right hip: M25.551

## 2020-05-11 LAB — CULTURE, BLOOD (ROUTINE X 2)
Culture: NO GROWTH
Culture: NO GROWTH
Special Requests: ADEQUATE
Special Requests: ADEQUATE

## 2020-05-11 NOTE — Telephone Encounter (Signed)
Transition Care Management Follow-up Telephone Call  Date of discharge and from where: 05/08/2020 [name]  How have you been since you were released from the hospital? Getting better just feeling weak and drained.  Any questions or concerns? Is she not to take the antibiotics that Dr. Baird Cancer gave her because the hospital said to stop it and to take what he prescribed for pneumonia.   Items Reviewed:  yesnd understand the discharge instructions provided? Yes  Medications obtained and verified? Yes  Any new allergies since your discharge? No  Dietary orders reviewed? Yes  Do you have support at home? yes  Other (ie: DME, Home Health, etc) Yes   Functional Questionnaire: (I = Independent and D = Dependent)  Bathing/Dressing- D   Meal Prep- D  Eating- I  Transferring/Ambulation-   Maintaining continence- I  Managing Meds- I   Follow up appointments reviewed:    PCP Hospital f/u appt confirmed? Yes 05/12/2020 scheduled to see [facility] f/u appt confirmed? n/A  Are transportation arrangements needed?No  If their condition worsens, is the pt aware to call  their PCP or go to the ED?Yes  Was the patient provided with contact information for the PCP's office or ED?Yes  Was the pt encouraged to call back with questions or concerns? Yes

## 2020-05-11 NOTE — Telephone Encounter (Signed)
I left the pt a message that I was calling to check on her (TCM) and to schedule her for a hospital f/u

## 2020-05-12 ENCOUNTER — Other Ambulatory Visit: Payer: Self-pay | Admitting: Nurse Practitioner

## 2020-05-12 ENCOUNTER — Encounter: Payer: Self-pay | Admitting: Nurse Practitioner

## 2020-05-12 ENCOUNTER — Ambulatory Visit (INDEPENDENT_AMBULATORY_CARE_PROVIDER_SITE_OTHER): Payer: Medicare Other | Admitting: Nurse Practitioner

## 2020-05-12 ENCOUNTER — Other Ambulatory Visit: Payer: Self-pay

## 2020-05-12 VITALS — BP 132/84 | HR 84 | Temp 98.7°F | Ht 65.0 in | Wt 242.2 lb

## 2020-05-12 DIAGNOSIS — R21 Rash and other nonspecific skin eruption: Secondary | ICD-10-CM

## 2020-05-12 DIAGNOSIS — J189 Pneumonia, unspecified organism: Secondary | ICD-10-CM | POA: Diagnosis not present

## 2020-05-12 DIAGNOSIS — Z1159 Encounter for screening for other viral diseases: Secondary | ICD-10-CM | POA: Diagnosis not present

## 2020-05-12 MED ORDER — MAGNESIUM 250 MG PO TABS: 1.0000 | ORAL_TABLET | Freq: Every evening | ORAL | 1 refills | Status: DC

## 2020-05-12 NOTE — Assessment & Plan Note (Signed)
   Overall doing well, some fatigue  She is to start home health with [facility], number given to contact  Repeat CXR week of 06/02/2020  TCM Performed. A member of the clinical team spoke with the patient upon dischare. Discharge summary was reviewed in full detail during the visit. Meds reconciled and compared to discharge meds. Medication list is updated and reviewed with the patient.  Greater than 50% face to face time was spent in counseling an coordination of care. All questions were answered to the satisfaction of the patient.

## 2020-05-12 NOTE — Progress Notes (Signed)
This visit occurred during the SARS-CoV-2 public health emergency.  Safety protocols were in place, including screening questions prior to the visit, additional usage of staff PPE, and extensive cleaning of exam room while observing appropriate contact time as indicated for disinfecting solutions.  Subjective:     Patient ID: [name] , female    DOB: 01/14/1941 , 79 y.o.   MRN: [id number]   Chief Complaint  Patient presents with  . Hospitalization Follow-up    HPI  Presents today for hospital follow up for pneumonia and dehydration. She went to the ED with complaints of fever and was treated for UTI and had lots of rigors.She is using the flutter valve and is coughing and doing good pulmonary toilet. She was told to stop her beta blocker and to resume with her follow up. She blood pressures are 133/75. She started the medication yesterday and stated that she feels better and noticed her pressure was elevated without it. She is also using melatonin now and is sleeping well without frequent night time awakening and polyuria. She is eating well and watching her salt.  Her blood sugars was 117 yesterday morning and states that the trulicity is working well . She reports a new rash on her left hip that started two days ago and is itching. Her husband gave her some cream for it but she can not remember the name.    Past Medical History:  Diagnosis Date  . Abdominal pain   . Arthritis    cervical disc degeneration/ oa left knee, carpal tunnel rt wrist, adhesive capsulitis right shoulder, rt hand weakness; lumbar degeneration  . Carpal tunnel syndrome   . 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 Constipation   . Diabetes mellitus without complication (HCC)    borderline - diet control  . Diverticulitis hx of  .  Diverticulosis   . E. coli infection    2020  . Frequent UTI    hx of urethral injury during colon surgery - states frequent uti's since  . GERD (gastroesophageal reflux disease)   . H/O hiatal hernia   . History of palpitations    in the past  . History of shingles    has a lingering itching on back where shingles were  . Hyperlipidemia   . Hypertension   . Numbness and tingling in right hand    pt. states has numbness of right hand very frequently-watch positioning  . Obesity   . Osteoarthritis   . Osteoporosis   . Pain    pain left knee and pain right hip and right groin  . Personal history of colonic polyps-adenoma 08/26/2008  . Pneumonia 2005  . Vitamin D deficiency      Family History  Problem Relation Age of Onset  . Breast cancer Mother 59  . Hypertension Mother   . Prostate cancer Father   . Hypertension Father   . Dementia Sister   . Lung cancer Brother   . Hypertension Brother   . COPD Brother   . Cancer Maternal Grandmother   . Arthritis Sister   . Hypertension Sister   . Arthritis Sister   . Hypertension Child   . Hypertension Child   . Hypertension Child   . Colon polyps Neg Hx   . Esophageal cancer Neg Hx   .   Rectal cancer Neg Hx   . Stomach cancer Neg Hx   . Colon cancer Neg Hx      Current Outpatient Medications:  .  allopurinol (ZYLOPRIM) 300 MG tablet, TAKE 1 TABLET(300 MG) BY MOUTH DAILY (Patient taking differently: Take 300 mg by mouth daily. ), Disp: 90 tablet, Rfl: 0 .  amLODipine-benazepril (LOTREL) 5-10 MG capsule, TAKE 1 CAPSULE BY MOUTH EVERY DAY (Patient taking differently: Take 1 capsule by mouth daily. ), Disp: 90 capsule, Rfl: 0 .  aspirin EC 81 MG tablet, Take 1 tablet (81 mg total) by mouth daily., Disp: 90 tablet, Rfl: 0 .  atenolol-chlorthalidone (TENORETIC) 50-25 MG tablet, TAKE 1 TABLET BY MOUTH EVERY DAY AT 11 AM, Disp: 90 tablet, Rfl: 0 .  azithromycin (ZITHROMAX Z-PAK) 250 MG tablet, Take 1 tablet (250 mg total) by mouth  daily., Disp: 2 each, Rfl: 0 .  cefdinir (OMNICEF) 300 MG capsule, Take 1 capsule (300 mg total) by mouth 2 (two) times daily., Disp: 8 capsule, Rfl: 0 .  Dulaglutide (TRULICITY) 4.31 VQ/0.0QQ SOPN, INJECT 0.5 ML(0.75 MG) SUBCUTANEOUSLY EVERY WEEK IN ABDOMEN, THIGH OR UPPER ARM ROTATING INJECTION SITE, Disp: 2 mL, Rfl: 0 .  ONETOUCH DELICA LANCETS 76P MISC, Use to check blood glucose once daily, Disp: 100 each, Rfl: 3 .  ONETOUCH VERIO test strip, USE AS DIRECTED TO TEST BLOOD SUGAR BID, Disp: , Rfl:  .  pantoprazole (PROTONIX) 40 MG tablet, TAKE 1 TABLET(40 MG) BY MOUTH DAILY (Patient taking differently: Take 40 mg by mouth daily. ), Disp: 90 tablet, Rfl: 1 .  Probiotic Product (PROBIOTIC PO), Take 1 capsule by mouth daily. , Disp: , Rfl:  .  rosuvastatin (CRESTOR) 20 MG tablet, TAKE 1 TABLET(20 MG) BY MOUTH DAILY (Patient taking differently: Take 20 mg by mouth daily. ), Disp: 90 tablet, Rfl: 0 .  Magnesium 250 MG TABS, Take 1 tablet (250 mg total) by mouth every evening. Take with dinner meal, Disp: 90 tablet, Rfl: 1 .  Multiple Vitamins-Minerals (ZINC PO), Take 1 tablet by mouth daily. (Patient not taking: Reported on 05/11/2020), Disp: , Rfl:    Allergies  Allergen Reactions  . Atorvastatin Calcium Itching  . Bee Venom Hives  . Oxycodone Other (See Comments)    hallucinations  . Vicodin [Hydrocodone-Acetaminophen] Other (See Comments)    hallucinations     Review of Systems  Constitutional: Negative.   HENT: Negative.   Eyes: Negative.   Respiratory: Negative for cough and wheezing.        Using flutter valve, Performing deep breathing and cough with flutter valve hourly  Cardiovascular: Negative.  Negative for chest pain, palpitations and leg swelling.  Endocrine: Negative.  Negative for polydipsia, polyphagia and polyuria.  Genitourinary: Negative.   Skin: Positive for rash.       Left upper thigh   Allergic/Immunologic: Negative.   Hematological: Negative.    Psychiatric/Behavioral: Negative.      Today's Vitals   05/12/20 1131  BP: 132/84  Pulse: 84  Temp: 98.7 F (37.1 C)  TempSrc: Oral  Weight: 242 lb 3.2 oz (109.9 kg)  Height: 5' 5" (1.651 m)  PainSc: 0-No pain   Body mass index is 40.3 kg/m.   Objective:  Physical Exam Constitutional:      Appearance: Normal appearance. She is obese. She is not ill-appearing.  HENT:     Head: Normocephalic and atraumatic.  Neck:     Vascular: Carotid bruit present.  Cardiovascular:     Rate and Rhythm:  Normal rate and regular rhythm.     Pulses: Normal pulses.     Heart sounds: Normal heart sounds. No murmur heard.   Pulmonary:     Effort: Pulmonary effort is normal. No respiratory distress.     Breath sounds: Normal breath sounds. No wheezing.     Comments: Productive cough present with deep breathing Musculoskeletal:        General: Normal range of motion.  Skin:    General: Skin is warm and dry.     Capillary Refill: Capillary refill takes less than 2 seconds.     Findings: Rash (flat red rash present to left upper lateral thigh) present.  Neurological:     General: No focal deficit present.     Mental Status: She is alert and oriented to person, place, and time. Mental status is at baseline.     Cranial Nerves: No cranial nerve deficit.  Psychiatric:        Mood and Affect: Mood normal.        Behavior: Behavior normal.        Thought Content: Thought content normal.        Judgment: Judgment normal.         Assessment And Plan:     Problem List Items Addressed This Visit      Respiratory   CAP (community acquired pneumonia) - Primary     Overall doing well, some fatigue  She is to start home health with [facility], number given to contact  Repeat CXR week of 06/02/2020  TCM Performed. A member of the clinical team spoke with the patient upon dischare. Discharge summary was reviewed in full detail during the visit. Meds reconciled and compared to discharge meds.  Medication list is updated and reviewed with the patient.  Greater than 50% face to face time was spent in counseling an coordination of care. All questions were answered to the satisfaction of the patient.          Relevant Orders   DG Chest 2 View    Other Visit Diagnoses    Hypomagnesemia       - Will check magnesium level, was 1.5 in the hospital - advised to take 250 mg of magnesium with dinner meal   Relevant Medications   Magnesium 250 MG TABS   Other Relevant Orders   Magnesium   CMP14+EGFR   Rash and nonspecific skin eruption       - slight red rash present to left hip, continue with cortisone cream - if not better by tomorrow afternoon to call back to office for possible medications   Encounter for hepatitis C screening test for low risk patient       Relevant Orders   Hepatitis C antibody       Patient was given opportunity to ask questions. Patient verbalized understanding of the plan and was able to repeat key elements of the plan. All questions were answered to their satisfaction.  Janece Moore, FNP   I, Janece Moore, FNP, have reviewed all documentation for this visit. The documentation on 05/12/20 for the exam, diagnosis, procedures, and orders are all accurate and complete.  THE PATIENT IS ENCOURAGED TO PRACTICE SOCIAL DISTANCING DUE TO THE COVID-19 PANDEMIC.   

## 2020-05-12 NOTE — Patient Instructions (Addendum)
Go to [facility] on August 8th for your repeat CXR on [address] for a repeat xray.   Call your cardiologist to make an appt

## 2020-05-13 DIAGNOSIS — Z96653 Presence of artificial knee joint, bilateral: Secondary | ICD-10-CM | POA: Diagnosis not present

## 2020-05-13 DIAGNOSIS — Z471 Aftercare following joint replacement surgery: Secondary | ICD-10-CM | POA: Diagnosis not present

## 2020-05-13 DIAGNOSIS — A419 Sepsis, unspecified organism: Secondary | ICD-10-CM | POA: Diagnosis not present

## 2020-05-13 DIAGNOSIS — J440 Chronic obstructive pulmonary disease with acute lower respiratory infection: Secondary | ICD-10-CM | POA: Diagnosis not present

## 2020-05-13 DIAGNOSIS — M25551 Pain in right hip: Secondary | ICD-10-CM | POA: Diagnosis not present

## 2020-05-13 DIAGNOSIS — N390 Urinary tract infection, site not specified: Secondary | ICD-10-CM | POA: Diagnosis not present

## 2020-05-13 LAB — CMP14+EGFR
ALT: 55 IU/L — ABNORMAL HIGH (ref 0–32)
AST: 53 IU/L — ABNORMAL HIGH (ref 0–40)
Albumin/Globulin Ratio: 1.4 (ref 1.2–2.2)
Albumin: 4.2 g/dL (ref 3.7–4.7)
Alkaline Phosphatase: 53 IU/L (ref 48–121)
BUN/Creatinine Ratio: 18 (ref 12–28)
BUN: 23 mg/dL (ref 8–27)
Bilirubin Total: 0.9 mg/dL (ref 0.0–1.2)
CO2: 25 mmol/L (ref 20–29)
Calcium: 9.4 mg/dL (ref 8.7–10.3)
Chloride: 103 mmol/L (ref 96–106)
Creatinine, Ser: 1.29 mg/dL — ABNORMAL HIGH (ref 0.57–1.00)
GFR calc Af Amer: 46 mL/min/{1.73_m2} — ABNORMAL LOW (ref >59)
GFR calc non Af Amer: 40 mL/min/{1.73_m2} — ABNORMAL LOW (ref >59)
Globulin, Total: 2.9 g/dL (ref 1.5–4.5)
Glucose: 116 mg/dL — ABNORMAL HIGH (ref 65–99)
Potassium: 4.4 mmol/L (ref 3.5–5.2)
Sodium: 142 mmol/L (ref 134–144)
Total Protein: 7.1 g/dL (ref 6.0–8.5)

## 2020-05-13 LAB — MAGNESIUM: Magnesium: 1.7 mg/dL (ref 1.6–2.3)

## 2020-05-13 LAB — HEPATITIS C ANTIBODY: Hep C Virus Ab: <0.1 s/co ratio (ref 0.0–0.9)

## 2020-05-14 DIAGNOSIS — A419 Sepsis, unspecified organism: Secondary | ICD-10-CM | POA: Diagnosis not present

## 2020-05-14 DIAGNOSIS — N390 Urinary tract infection, site not specified: Secondary | ICD-10-CM | POA: Diagnosis not present

## 2020-05-14 DIAGNOSIS — J440 Chronic obstructive pulmonary disease with acute lower respiratory infection: Secondary | ICD-10-CM | POA: Diagnosis not present

## 2020-05-14 DIAGNOSIS — J181 Lobar pneumonia, unspecified organism: Secondary | ICD-10-CM | POA: Diagnosis not present

## 2020-05-15 ENCOUNTER — Other Ambulatory Visit: Payer: Self-pay | Admitting: Nurse Practitioner

## 2020-05-17 DIAGNOSIS — J440 Chronic obstructive pulmonary disease with acute lower respiratory infection: Secondary | ICD-10-CM | POA: Diagnosis not present

## 2020-05-17 DIAGNOSIS — A419 Sepsis, unspecified organism: Secondary | ICD-10-CM | POA: Diagnosis not present

## 2020-05-17 DIAGNOSIS — N390 Urinary tract infection, site not specified: Secondary | ICD-10-CM | POA: Diagnosis not present

## 2020-05-19 DIAGNOSIS — J189 Pneumonia, unspecified organism: Secondary | ICD-10-CM

## 2020-05-19 DIAGNOSIS — Z79899 Other long term (current) drug therapy: Secondary | ICD-10-CM | POA: Diagnosis not present

## 2020-05-19 DIAGNOSIS — I10 Essential (primary) hypertension: Secondary | ICD-10-CM | POA: Diagnosis not present

## 2020-05-20 DIAGNOSIS — A419 Sepsis, unspecified organism: Secondary | ICD-10-CM | POA: Diagnosis not present

## 2020-05-20 DIAGNOSIS — N390 Urinary tract infection, site not specified: Secondary | ICD-10-CM | POA: Diagnosis not present

## 2020-05-20 DIAGNOSIS — J440 Chronic obstructive pulmonary disease with acute lower respiratory infection: Secondary | ICD-10-CM | POA: Diagnosis not present

## 2020-05-21 ENCOUNTER — Telehealth: Payer: Self-pay

## 2020-05-21 NOTE — Telephone Encounter (Signed)
I left the pt a message that I was calling because Dr Baird Cancer wanted to know how the pt is feeling.

## 2020-05-26 DIAGNOSIS — H2512 Age-related nuclear cataract, left eye: Secondary | ICD-10-CM | POA: Diagnosis not present

## 2020-05-28 DIAGNOSIS — N390 Urinary tract infection, site not specified: Secondary | ICD-10-CM | POA: Diagnosis not present

## 2020-05-28 DIAGNOSIS — J440 Chronic obstructive pulmonary disease with acute lower respiratory infection: Secondary | ICD-10-CM | POA: Diagnosis not present

## 2020-05-28 DIAGNOSIS — A419 Sepsis, unspecified organism: Secondary | ICD-10-CM | POA: Diagnosis not present

## 2020-06-01 ENCOUNTER — Other Ambulatory Visit: Payer: Self-pay

## 2020-06-01 ENCOUNTER — Ambulatory Visit (INDEPENDENT_AMBULATORY_CARE_PROVIDER_SITE_OTHER): Payer: Medicare Other | Admitting: Internal Medicine

## 2020-06-01 ENCOUNTER — Encounter: Payer: Self-pay | Admitting: Internal Medicine

## 2020-06-01 VITALS — BP 130/62 | HR 60 | Temp 97.9°F | Ht 64.2 in | Wt 242.8 lb

## 2020-06-01 DIAGNOSIS — Z1231 Encounter for screening mammogram for malignant neoplasm of breast: Secondary | ICD-10-CM

## 2020-06-01 DIAGNOSIS — E1122 Type 2 diabetes mellitus with diabetic chronic kidney disease: Secondary | ICD-10-CM | POA: Diagnosis not present

## 2020-06-01 DIAGNOSIS — E559 Vitamin D deficiency, unspecified: Secondary | ICD-10-CM

## 2020-06-01 DIAGNOSIS — N183 Chronic kidney disease, stage 3 unspecified: Secondary | ICD-10-CM | POA: Diagnosis not present

## 2020-06-01 DIAGNOSIS — I129 Hypertensive chronic kidney disease with stage 1 through stage 4 chronic kidney disease, or unspecified chronic kidney disease: Secondary | ICD-10-CM

## 2020-06-01 DIAGNOSIS — Z6841 Body Mass Index (BMI) 40.0 and over, adult: Secondary | ICD-10-CM

## 2020-06-01 DIAGNOSIS — Z0000 Encounter for general adult medical examination without abnormal findings: Secondary | ICD-10-CM | POA: Diagnosis not present

## 2020-06-01 NOTE — Patient Instructions (Addendum)
If you had lab work done today you will be contacted with your lab results within the next 2 weeks.  If you have not heard from [place] ---- please contact us. The fastest way to get your results is to register for My Chart.   Health Maintenance After Age 78 After age 89, you are at a higher risk for certain long-term diseases and infections as well as injuries from falls. Falls are a major cause of broken bones and head injuries in people who are older than age 1. Getting regular preventive care can help to keep you healthy and well. Preventive care includes getting regular testing and making lifestyle changes as recommended by your health care provider. Talk with your health care provider about:  Which screenings and tests you should have. A screening is a test that checks for a disease when you have no symptoms.  A diet and exercise plan that is right for you. What should I know about screenings and tests to prevent falls? Screening and testing are the best ways to find a health problem early. Early diagnosis and treatment give you the best chance of managing medical conditions that are common after age 53. Certain conditions and lifestyle choices may make you more likely to have a fall. Your health care provider may recommend:  Regular vision checks. Poor vision and conditions such as cataracts can make you more likely to have a fall. If you wear glasses, make sure to get your prescription updated if your vision changes.  Medicine review. Work with your health care provider to regularly review all of the medicines you are taking, including over-the-counter medicines. Ask your health care provider about any side effects that may make you more likely to have a fall. Tell your health care provider if any medicines that you take make you feel dizzy or sleepy.  Osteoporosis screening. Osteoporosis is a condition that causes the bones to get weaker. This can make the bones weak and cause them to break more  easily.  Blood pressure screening. Blood pressure changes and medicines to control blood pressure can make you feel dizzy.  Strength and balance checks. Your health care provider may recommend certain tests to check your strength and balance while standing, walking, or changing positions.  Foot health exam. Foot pain and numbness, as well as not wearing proper footwear, can make you more likely to have a fall.  Depression screening. You may be more likely to have a fall if you have a fear of falling, feel emotionally low, or feel unable to do activities that you used to do.  Alcohol use screening. Using too much alcohol can affect your balance and may make you more likely to have a fall. What actions can I take to lower my risk of falls? General instructions  Talk with your health care provider about your risks for falling. Tell your health care provider if: ? You fall. Be sure to tell your health care provider about all falls, even ones that seem minor. ? You feel dizzy, sleepy, or off-balance.  Take over-the-counter and prescription medicines only as told by your health care provider. These include any supplements.  Eat a healthy diet and maintain a healthy weight. A healthy diet includes low-fat dairy products, low-fat (lean) meats, and fiber from whole grains, beans, and lots of fruits and vegetables. Home safety  Remove any tripping hazards, such as rugs, cords, and clutter.  Install safety equipment such as grab bars in bathrooms and safety  rails on stairs.  Keep rooms and walkways well-lit. Activity   Follow a regular exercise program to stay fit. This will help you maintain your balance. Ask your health care provider what types of exercise are appropriate for you.  If you need a cane or walker, use it as recommended by your health care provider.  Wear supportive shoes that have nonskid soles. Lifestyle  Do not drink alcohol if your health care provider tells you not to  drink.  If you drink alcohol, limit how much you have: ? 0-1 drink a day for women. ? 0-2 drinks a day for men.  Be aware of how much alcohol is in your drink. In the [place] one drink equals one typical bottle of beer (12 oz), one-half glass of wine (5 oz), or one shot of hard liquor (1 oz).  Do not use any products that contain nicotine or tobacco, such as cigarettes and e-cigarettes. If you need help quitting, ask your health care provider. Summary  Having a healthy lifestyle and getting preventive care can help to protect your health and wellness after age 45.  Screening and testing are the best way to find a health problem early and help you avoid having a fall. Early diagnosis and treatment give you the best chance for managing medical conditions that are more common for people who are older than age 57.  Falls are a major cause of broken bones and head injuries in people who are older than age 71. Take precautions to prevent a fall at home.  Work with your health care provider to learn what changes you can make to improve your health and wellness and to prevent falls. This information is not intended to replace advice given to you by your health care provider. Make sure you discuss any questions you have with your health care provider. Document Revised: 02/06/2019 Document Reviewed: 08/29/2017 Elsevier Patient Education  2020 [employer].

## 2020-06-01 NOTE — Progress Notes (Signed)
I,Tianna Badgett,acting as a Education administrator for Maximino Greenland, MD.,have documented all relevant documentation on the behalf of Maximino Greenland, MD,as directed by  Maximino Greenland, MD while in the presence of Maximino Greenland, MD.  This visit occurred during the SARS-CoV-2 public health emergency.  Safety protocols were in place, including screening questions prior to the visit, additional usage of staff PPE, and extensive cleaning of exam room while observing appropriate contact time as indicated for disinfecting solutions.  Subjective:     Patient ID: [name] , female    DOB: [birth year] , 79 y.o.   MRN: [id number]   Chief Complaint  Patient presents with  . Annual Exam  . Diabetes  . Hypertension    HPI  Pt presents today for full physical exam. Pt has no concerns at this time. Pt states that she is compliant with medication. She was recently hospitalized for CAP from 7/8-7/10. She had hospital f/u with Dr. Doreene Burke, [place]. She is slowly getting her energy back.   Diabetes She presents for her follow-up diabetic visit. She has type 2 diabetes mellitus. Her disease course has been stable. There are no hypoglycemic associated symptoms. There are no hypoglycemic complications. She participates in exercise intermittently.  Hypertension This is a chronic problem. The current episode started more than 1 year ago. The problem is unchanged. The problem is controlled. Risk factors for coronary artery disease include diabetes mellitus, dyslipidemia, obesity and sedentary lifestyle. The current treatment provides moderate improvement. Hypertensive end-organ damage includes kidney disease.     Past Medical History:  Diagnosis Date  . Abdominal pain   . Arthritis    cervical disc degeneration/ oa left knee, carpal tunnel rt wrist, adhesive capsulitis right shoulder, rt hand weakness; lumbar degeneration  . Carpal tunnel syndrome   . Complication of anesthesia 2006-at [facility]   breathing problems-no  BP med given prior to surgery;  hx of being very sleepy after colon surgery --  states no problems with last right total knee replacement 2013  . Constipation   . Diabetes mellitus without complication ([place])    borderline - diet control  . Diverticulitis hx of  . Diverticulosis   . E. coli infection    2020  . Frequent UTI    hx of urethral injury during colon surgery - states frequent uti's since  . GERD (gastroesophageal reflux disease)   . H/O hiatal hernia   . History of palpitations    in the past  . History of shingles    has a lingering itching on back where shingles were  . Hyperlipidemia   . Hypertension   . Numbness and tingling in right hand    pt. states has numbness of right hand very frequently-watch positioning  . Obesity   . Osteoarthritis   . Osteoporosis   . Pain    pain left knee and pain right hip and right groin  . Personal history of colonic polyps-adenoma 08/26/2008  . Pneumonia 2005  . Vitamin D deficiency      Family History  Problem Relation Age of Onset  . Breast cancer Mother 63  . Hypertension Mother   . Prostate cancer Father   . Hypertension Father   . Dementia Sister   . Lung cancer Brother   . Hypertension Brother   . COPD Brother   . Cancer Maternal Grandmother   . Arthritis Sister   . Hypertension Sister   . Arthritis Sister   . Hypertension Child   .  Hypertension Child   . Hypertension Child   . Colon polyps Neg Hx   . Esophageal cancer Neg Hx   . Rectal cancer Neg Hx   . Stomach cancer Neg Hx   . Colon cancer Neg Hx      Current Outpatient Medications:  .  allopurinol (ZYLOPRIM) 300 MG tablet, TAKE 1 TABLET(300 MG) BY MOUTH DAILY (Patient taking differently: Take 300 mg by mouth daily. ), Disp: 90 tablet, Rfl: 0 .  amLODipine-benazepril (LOTREL) 5-10 MG capsule, TAKE 1 CAPSULE BY MOUTH EVERY DAY (Patient taking differently: Take 1 capsule by mouth daily. ), Disp: 90 capsule, Rfl: 0 .  aspirin EC 81 MG tablet, Take 1 tablet  (81 mg total) by mouth daily., Disp: 90 tablet, Rfl: 0 .  atenolol-chlorthalidone (TENORETIC) 50-25 MG tablet, TAKE 1 TABLET BY MOUTH EVERY DAY AT 11 AM, Disp: 90 tablet, Rfl: 0 .  Dulaglutide (TRULICITY) 6.37 CH/8.8FO SOPN, INJECT 0.5 ML(0.75 MG) SUBCUTANEOUSLY EVERY WEEK IN ABDOMEN, THIGH OR UPPER ARM ROTATING INJECTION SITE, Disp: 2 mL, Rfl: 0 .  Lancets (ONETOUCH DELICA PLUS [id number]) MISC, USE AS DIRECTED TO CHECK BLOOD SUGAR TWICE DAILY, Disp: 100 each, Rfl: 3 .  Magnesium 250 MG TABS, Take 1 tablet (250 mg total) by mouth every evening. Take with dinner meal, Disp: 90 tablet, Rfl: 1 .  Multiple Vitamins-Minerals (ZINC PO), Take 1 tablet by mouth daily. , Disp: , Rfl:  .  ONETOUCH VERIO test strip, USE AS DIRECTED TO TEST BLOOD SUGAR TWICE DAILY, Disp: 100 strip, Rfl: 3 .  Probiotic Product (PROBIOTIC PO), Take 1 capsule by mouth daily. , Disp: , Rfl:  .  rosuvastatin (CRESTOR) 20 MG tablet, TAKE 1 TABLET(20 MG) BY MOUTH DAILY (Patient taking differently: Take 20 mg by mouth daily. ), Disp: 90 tablet, Rfl: 0 .  Cholecalciferol (VITAMIN D3) 1.25 MG (50000 UT) CAPS, Take 1 capsule by mouth on Tuesday and Friday, Disp: 24 capsule, Rfl: 1   Allergies  Allergen Reactions  . Atorvastatin Calcium Itching  . Bee Venom Hives  . Oxycodone Other (See Comments)    hallucinations  . Vicodin [Hydrocodone-Acetaminophen] Other (See Comments)    hallucinations      The patient states she uses post menopausal status for birth control. Last LMP was No LMP recorded. Patient has had a hysterectomy.. Negative for Dysmenorrhea. Negative for: breast discharge, breast lump(s), breast pain and breast self exam. Associated symptoms include abnormal vaginal bleeding. Pertinent negatives include abnormal bleeding (hematology), anxiety, decreased libido, depression, difficulty falling sleep, dyspareunia, history of infertility, nocturia, sexual dysfunction, sleep disturbances, urinary incontinence, urinary urgency,  vaginal discharge and vaginal itching. Diet regular.The patient states her exercise level is    . The patient's tobacco use is:  Social History   Tobacco Use  Smoking Status Never Smoker  Smokeless Tobacco Never Used  . She has been exposed to passive smoke. The patient's alcohol use is:  Social History   Substance and Sexual Activity  Alcohol Use No    Review of Systems  Constitutional: Negative.   HENT: Negative.   Eyes: Negative.   Respiratory: Negative.   Cardiovascular: Negative.   Endocrine: Negative.   Genitourinary: Negative.   Musculoskeletal: Negative.   Skin: Negative.   Allergic/Immunologic: Negative.   Neurological: Negative.   Hematological: Negative.   Psychiatric/Behavioral: Negative.      Today's Vitals   06/01/20 1526  BP: 130/62  Pulse: 60  Temp: 97.9 F (36.6 C)  TempSrc: Oral  Weight: 242 lb  12.8 oz (110.1 kg)  Height: 5' 4.2" (1.631 m)   Body mass index is 41.42 kg/m.   Objective:  Physical Exam Constitutional:      General: She is not in acute distress.    Appearance: Normal appearance. She is well-developed. She is obese.  HENT:     Head: Normocephalic and atraumatic.     Right Ear: Hearing, tympanic membrane, ear canal and external ear normal. There is no impacted cerumen.     Left Ear: Hearing, tympanic membrane, ear canal and external ear normal. There is no impacted cerumen.     Nose: Nose normal.     Mouth/Throat:     Mouth: Mucous membranes are dry.  Eyes:     General: Lids are normal.     Extraocular Movements: Extraocular movements intact.     Conjunctiva/sclera: Conjunctivae normal.     Pupils: Pupils are equal, round, and reactive to light.     Funduscopic exam:    Right eye: No papilledema.        Left eye: No papilledema.  Neck:     Thyroid: No thyroid mass.     Vascular: No carotid bruit.  Cardiovascular:     Rate and Rhythm: Normal rate and regular rhythm.     Pulses: Normal pulses.          Dorsalis pedis  pulses are 2+ on the right side and 2+ on the left side.     Heart sounds: Normal heart sounds. No murmur heard.   Pulmonary:     Effort: Pulmonary effort is normal.     Breath sounds: Normal breath sounds.  Chest:     Breasts: Tanner Score is 5.        Right: Normal.        Left: Normal.  Abdominal:     General: Bowel sounds are normal. There is no distension.     Palpations: Abdomen is soft.     Tenderness: There is no abdominal tenderness.     Comments: Obese, soft. Difficult to assess organomegaly.   Genitourinary:    Rectum: Guaiac result negative.  Musculoskeletal:        General: No swelling. Normal range of motion.     Cervical back: Full passive range of motion without pain, normal range of motion and neck supple.     Right lower leg: No edema.     Left lower leg: No edema.  Feet:     Right foot:     Protective Sensation: 5 sites tested. 5 sites sensed.     Skin integrity: Callus and dry skin present.     Toenail Condition: Right toenails are long.     Left foot:     Protective Sensation: 5 sites tested. 5 sites sensed.     Skin integrity: Callus and dry skin present.     Toenail Condition: Left toenails are long.  Skin:    General: Skin is warm and dry.     Capillary Refill: Capillary refill takes less than 2 seconds.  Neurological:     General: No focal deficit present.     Mental Status: She is alert and oriented to person, place, and time.     Cranial Nerves: No cranial nerve deficit.     Sensory: No sensory deficit.  Psychiatric:        Mood and Affect: Mood normal.        Behavior: Behavior normal.        Thought Content: Thought  content normal.        Judgment: Judgment normal.         Assessment And Plan:     1. Encounter for annual physical exam Comments: A full exam was performed.  Importance of monthly self breast exams was discussed with the patient.  PATIENT IS ADVISED TO GET 30-45 MINUTES REGULAR EXERCISE NO LESS THAN FOUR TO FIVE DAYS PER  WEEK - BOTH WEIGHTBEARING EXERCISES AND AEROBIC ARE RECOMMENDED.  PATIENT IS ADVISED TO FOLLOW A HEALTHY DIET WITH AT LEAST SIX FRUITS/VEGGIES PER DAY, DECREASE INTAKE OF RED MEAT, AND TO INCREASE FISH INTAKE TO TWO DAYS PER WEEK.  MEATS/FISH SHOULD NOT BE FRIED, BAKED OR BROILED IS PREFERABLE.  I SUGGEST WEARING SPF 50 SUNSCREEN ON EXPOSED PARTS AND ESPECIALLY WHEN IN THE DIRECT SUNLIGHT FOR AN EXTENDED PERIOD OF TIME.  PLEASE AVOID FAST FOOD RESTAURANTS AND INCREASE YOUR WATER INTAKE.    2. Type 2 diabetes mellitus with stage 3 chronic kidney disease, without long-term current use of insulin, unspecified whether stage 3a or 3b CKD ([place]) Comments: Diabetic foot exam performed.  Compliant with medications.  I DISCUSSED WITH THE PATIENT AT LENGTH REGARDING THE GOALS OF GLYCEMIC CONTROL AND POSSIBLE LONG-TERM COMPLICATIONS.  I  ALSO STRESSED THE IMPORTANCE OF COMPLIANCE WITH HOME GLUCOSE MONITORING, DIETARY RESTRICTIONS INCLUDING AVOIDANCE OF SUGARY DRINKS/PROCESSED FOODS,  ALONG WITH REGULAR EXERCISE.  I  ALSO STRESSED THE IMPORTANCE OF ANNUAL EYE EXAMS, SELF FOOT CARE AND COMPLIANCE WITH OFFICE VISITS.  - CBC - Lipid panel - POCT UA - Microalbumin - POCT urinalysis dipstick - EKG 12-Lead - BMP8+EGFR  3. Hypertensive nephropathy Comments: Chronic, controlled. She will continue with current meds. She is encouraged to avoid adding salt to her foods. EKG performed, NSR w/ LBBB - no new changes.  She will rto in four months for re-evaluation.   4. Vitamin D deficiency Comments: I will check vitamin d level and supplement as needed.  - VITAMIN D 25 Hydroxy (Vit-D Deficiency, Fractures)  5. Class 3 severe obesity due to excess calories with serious comorbidity and body mass index (BMI) of 40.0 to 44.9 in adult (HCC)  BMI 41. Encouraged to strive for BMI less than 35 to decrease cardiac risk.   6. Encounter for screening mammogram for malignant neoplasm of breast  She agrees to referral for annual  mammogram. I will refer her to the [facility].   Patient was given opportunity to ask questions. Patient verbalized understanding of the plan and was able to repeat key elements of the plan. All questions were answered to their satisfaction.   Maximino Greenland, MD   I, Maximino Greenland, MD, have reviewed all documentation for this visit. The documentation on 06/27/20 for the exam, diagnosis, procedures, and orders are all accurate and complete.  THE PATIENT IS ENCOURAGED TO PRACTICE SOCIAL DISTANCING DUE TO THE COVID-19 PANDEMIC.

## 2020-06-02 LAB — LIPID PANEL
Chol/HDL Ratio: 4.5 ratio — ABNORMAL HIGH (ref 0.0–4.4)
Cholesterol, Total: 170 mg/dL (ref 100–199)
HDL: 38 mg/dL — ABNORMAL LOW (ref >39)
LDL Chol Calc (NIH): 103 mg/dL — ABNORMAL HIGH (ref 0–99)
Triglycerides: 167 mg/dL — ABNORMAL HIGH (ref 0–149)
VLDL Cholesterol Cal: 29 mg/dL (ref 5–40)

## 2020-06-02 LAB — CBC
Hematocrit: 39.8 % (ref 34.0–46.6)
Hemoglobin: 13.6 g/dL (ref 11.1–15.9)
MCH: 30.9 pg (ref 26.6–33.0)
MCHC: 34.2 g/dL (ref 31.5–35.7)
MCV: 91 fL (ref 79–97)
Platelets: 208 10*3/uL (ref 150–450)
RBC: 4.4 x10E6/uL (ref 3.77–5.28)
RDW: 13.4 % (ref 11.7–15.4)
WBC: 9.1 10*3/uL (ref 3.4–10.8)

## 2020-06-02 LAB — BMP8+EGFR
BUN/Creatinine Ratio: 23 (ref 12–28)
BUN: 26 mg/dL (ref 8–27)
CO2: 25 mmol/L (ref 20–29)
Calcium: 9.4 mg/dL (ref 8.7–10.3)
Chloride: 100 mmol/L (ref 96–106)
Creatinine, Ser: 1.12 mg/dL — ABNORMAL HIGH (ref 0.57–1.00)
GFR calc Af Amer: 54 mL/min/{1.73_m2} — ABNORMAL LOW (ref >59)
GFR calc non Af Amer: 47 mL/min/{1.73_m2} — ABNORMAL LOW (ref >59)
Glucose: 94 mg/dL (ref 65–99)
Potassium: 4.4 mmol/L (ref 3.5–5.2)
Sodium: 138 mmol/L (ref 134–144)

## 2020-06-02 LAB — VITAMIN D 25 HYDROXY (VIT D DEFICIENCY, FRACTURES): Vit D, 25-Hydroxy: 22.8 ng/mL — ABNORMAL LOW (ref 30.0–100.0)

## 2020-06-03 ENCOUNTER — Ambulatory Visit (INDEPENDENT_AMBULATORY_CARE_PROVIDER_SITE_OTHER): Payer: Medicare Other

## 2020-06-03 ENCOUNTER — Other Ambulatory Visit: Payer: Self-pay

## 2020-06-03 ENCOUNTER — Telehealth: Payer: Self-pay

## 2020-06-03 VITALS — BP 122/60 | HR 64 | Temp 98.1°F | Ht 64.2 in | Wt 242.0 lb

## 2020-06-03 DIAGNOSIS — M1710 Unilateral primary osteoarthritis, unspecified knee: Secondary | ICD-10-CM

## 2020-06-03 DIAGNOSIS — N183 Chronic kidney disease, stage 3 unspecified: Secondary | ICD-10-CM

## 2020-06-03 DIAGNOSIS — I10 Essential (primary) hypertension: Secondary | ICD-10-CM | POA: Diagnosis not present

## 2020-06-03 DIAGNOSIS — E1122 Type 2 diabetes mellitus with diabetic chronic kidney disease: Secondary | ICD-10-CM

## 2020-06-03 DIAGNOSIS — R300 Dysuria: Secondary | ICD-10-CM | POA: Diagnosis not present

## 2020-06-03 DIAGNOSIS — M25569 Pain in unspecified knee: Secondary | ICD-10-CM

## 2020-06-03 DIAGNOSIS — M25559 Pain in unspecified hip: Secondary | ICD-10-CM

## 2020-06-03 LAB — POCT URINALYSIS DIPSTICK
Bilirubin, UA: NEGATIVE
Blood, UA: NEGATIVE
Glucose, UA: NEGATIVE
Ketones, UA: NEGATIVE
Leukocytes, UA: NEGATIVE
Nitrite, UA: NEGATIVE
Protein, UA: NEGATIVE
Spec Grav, UA: 1.01 (ref 1.010–1.025)
Urobilinogen, UA: 0.2 E.U./dL
pH, UA: 6 (ref 5.0–8.0)

## 2020-06-03 NOTE — Progress Notes (Signed)
Here today for painful urination. Urine was checked.

## 2020-06-03 NOTE — Chronic Care Management (AMB) (Signed)
Chronic Care Management    Social Work Follow Up Note  06/03/2020 Name: [name] MRN: [id number] DOB: 05/31/1941  [name] is a 79 y.o. year old female who is a primary care patient of [name], MD. The CCM team was consulted for assistance with care coordination.   Review of patient status, including review of consultants reports, other relevant assessments, and collaboration with appropriate care team members and the patient's provider was performed as part of comprehensive patient evaluation and provision of chronic care management services.    SDOH (Social Determinants of Health) assessments performed: No    Outpatient Encounter Medications as of 06/03/2020  Medication Sig  . allopurinol (ZYLOPRIM) 300 MG tablet TAKE 1 TABLET(300 MG) BY MOUTH DAILY (Patient taking differently: Take 300 mg by mouth daily. )  . amLODipine-benazepril (LOTREL) 5-10 MG capsule TAKE 1 CAPSULE BY MOUTH EVERY DAY (Patient taking differently: Take 1 capsule by mouth daily. )  . aspirin EC 81 MG tablet Take 1 tablet (81 mg total) by mouth daily.  Marland Kitchen atenolol-chlorthalidone (TENORETIC) 50-25 MG tablet TAKE 1 TABLET BY MOUTH EVERY DAY AT 11 AM  . Dulaglutide (TRULICITY) 7.10 GY/6.9SW SOPN INJECT 0.5 ML(0.75 MG) SUBCUTANEOUSLY EVERY WEEK IN ABDOMEN, THIGH OR UPPER ARM ROTATING INJECTION SITE  . Lancets (ONETOUCH DELICA PLUS [id number]) MISC USE AS DIRECTED TO CHECK BLOOD SUGAR TWICE DAILY  . Magnesium 250 MG TABS Take 1 tablet (250 mg total) by mouth every evening. Take with dinner meal  . Multiple Vitamins-Minerals (ZINC PO) Take 1 tablet by mouth daily.   [name] test strip USE AS DIRECTED TO TEST BLOOD SUGAR TWICE DAILY  . Probiotic Product (PROBIOTIC PO) Take 1 capsule by mouth daily.   . rosuvastatin (CRESTOR) 20 MG tablet TAKE 1 TABLET(20 MG) BY MOUTH DAILY (Patient taking differently: Take 20 mg by mouth daily. )   No facility-administered encounter medications on file as of 06/03/2020.      Goals Addressed              This Visit's Progress     Patient Stated   .  "I need dental care but can't afford it" (pt-stated)        CARE PLAN ENTRY (see longitudinal plan of care for additional care plan information)  Current Barriers:  . Financial constraints related to the cost of dental care . Limited knowledge of dental clinics and low cost offices in the surrounding area . Chronic conditions including DM II, HTN, and CKD III which put patient at increased risk for hospitalization  Social Work Clinical Goal(s):  Marland Kitchen Over the next 60 days the patient will work with SW to identify dental resources to assist with needed dental care  CCM SW Interventions: Completed 06/03/20 . Inter-disciplinary care team collaboration (see longitudinal plan of care) . Inbound call received from the patient who reports concern over needed extractions and fillings without the ability to afford co-pay amounts at this time . Discussed local dental schools which may be able to provide assistance o [place] [facility] does not perform extractions therefore the patient is not interested in applying for this program . Educated the patient on the [employer] of Dentistry and clinic which is access by a lottery system . Successfully completed patient registration to enroll in [place] . Provided the patient with information for [facility] which a low cost dental service in the event the patient would rather pay out of pocket than wait for the  [place] to possibly draw her name . Educated the patient on the opportunity to add a dental rider to her health plan during open enrollment in order to have better dental coverage in the upcoming plan year . Scheduled follow up call to the patient over the next month to assess goal progression  Patient Self Care Activities:  . Patient verbalizes understanding of plan to review dental resources provided by SW . Self administers  medications as prescribed . Attends all scheduled provider appointments . Calls pharmacy for medication refills . Performs ADL's independently . Calls provider office for new concerns or questions  Initial goal documentation         Follow Up Plan: SW will follow up with patient by phone over the next month.   [name], BSW, CDP Social Worker, Certified Dementia Practitioner [place] [facility] [phone]  Total time spent performing care coordination and/or care management activities with the patient by phone or face to face = 28 minutes.

## 2020-06-03 NOTE — Patient Instructions (Signed)
Social Worker Visit Information  Goals we discussed today:  Goals Addressed              This Visit's Progress     Patient Stated   .  "I need dental care but can't afford it" (pt-stated)        CARE PLAN ENTRY (see longitudinal plan of care for additional care plan information)  Current Barriers:  . Financial constraints related to the cost of dental care . Limited knowledge of dental clinics and low cost offices in the surrounding area . Chronic conditions including DM II, HTN, and CKD III which put patient at increased risk for hospitalization  Social Work Clinical Goal(s):  Marland Kitchen Over the next 60 days the patient will work with SW to identify dental resources to assist with needed dental care  CCM SW Interventions: Completed 06/03/20 . Inter-disciplinary care team collaboration (see longitudinal plan of care) . Inbound call received from the patient who reports concern over needed extractions and fillings without the ability to afford co-pay amounts at this time . Discussed local dental schools which may be able to provide assistance o [facility] [facility] does not perform extractions therefore the patient is not interested in applying for this program . Educated the patient on the [employer] of Dentistry and clinic which is access by a lottery system . Successfully completed patient registration to enroll in [place] . Provided the patient with information for [facility] which a low cost dental service in the event the patient would rather pay out of pocket than wait for the [place] to possibly draw her name . Educated the patient on the opportunity to add a dental rider to her health plan during open enrollment in order to have better dental coverage in the upcoming plan year . Scheduled follow up call to the patient over the next month to assess goal progression  Patient Self Care Activities:  . Patient verbalizes understanding of plan to review  dental resources provided by SW . Self administers medications as prescribed . Attends all scheduled provider appointments . Calls pharmacy for medication refills . Performs ADL's independently . Calls provider office for new concerns or questions  Initial goal documentation         Materials Provided: Verbal education about dental resources provided by phone  Follow Up Plan: SW will follow up with patient by phone over the next month.   [name], BSW, CDP Social Worker, Certified Dementia Practitioner [place] [facility] [phone]

## 2020-06-03 NOTE — Telephone Encounter (Signed)
The pt called and said that she feels weak and that she thinks she has a UTI.  The pt was told that she can come for a nurse visit to have her urine checked, if the pt has any other issues she would need to go to urgent care.  The office doesn't have any openings at today.

## 2020-06-07 ENCOUNTER — Other Ambulatory Visit: Payer: Self-pay

## 2020-06-07 MED ORDER — VITAMIN D3 1.25 MG (50000 UT) PO CAPS: ORAL_CAPSULE | ORAL | 1 refills | Status: DC

## 2020-06-08 DIAGNOSIS — J440 Chronic obstructive pulmonary disease with acute lower respiratory infection: Secondary | ICD-10-CM | POA: Diagnosis not present

## 2020-06-08 DIAGNOSIS — N390 Urinary tract infection, site not specified: Secondary | ICD-10-CM | POA: Diagnosis not present

## 2020-06-08 DIAGNOSIS — A419 Sepsis, unspecified organism: Secondary | ICD-10-CM | POA: Diagnosis not present

## 2020-06-09 DIAGNOSIS — J189 Pneumonia, unspecified organism: Secondary | ICD-10-CM

## 2020-06-09 DIAGNOSIS — J440 Chronic obstructive pulmonary disease with acute lower respiratory infection: Secondary | ICD-10-CM | POA: Diagnosis not present

## 2020-06-09 DIAGNOSIS — J181 Lobar pneumonia, unspecified organism: Secondary | ICD-10-CM | POA: Diagnosis not present

## 2020-06-25 DIAGNOSIS — H25812 Combined forms of age-related cataract, left eye: Secondary | ICD-10-CM | POA: Diagnosis not present

## 2020-07-03 ENCOUNTER — Other Ambulatory Visit: Payer: Self-pay | Admitting: Internal Medicine

## 2020-07-06 ENCOUNTER — Ambulatory Visit: Payer: Medicare Other

## 2020-07-06 DIAGNOSIS — N183 Chronic kidney disease, stage 3 unspecified: Secondary | ICD-10-CM

## 2020-07-06 DIAGNOSIS — I10 Essential (primary) hypertension: Secondary | ICD-10-CM

## 2020-07-06 DIAGNOSIS — E1122 Type 2 diabetes mellitus with diabetic chronic kidney disease: Secondary | ICD-10-CM

## 2020-07-06 NOTE — Chronic Care Management (AMB) (Signed)
Chronic Care Management    Social Work Follow Up Note  07/06/2020 Name: [name] MRN: [id number] DOB: 1941/08/24  [name] is a 79 y.o. year old female who is a primary care patient of [name], MD. The CCM team was consulted for assistance with care coordination.   Review of patient status, including review of consultants reports, other relevant assessments, and collaboration with appropriate care team members and the patient's provider was performed as part of comprehensive patient evaluation and provision of chronic care management services.    SDOH (Social Determinants of Health) assessments performed: No    Outpatient Encounter Medications as of 07/06/2020  Medication Sig  . allopurinol (ZYLOPRIM) 300 MG tablet TAKE 1 TABLET(300 MG) BY MOUTH DAILY (Patient taking differently: Take 300 mg by mouth daily. )  . amLODipine-benazepril (LOTREL) 5-10 MG capsule TAKE 1 CAPSULE BY MOUTH EVERY DAY (Patient taking differently: Take 1 capsule by mouth daily. )  . aspirin EC 81 MG tablet Take 1 tablet (81 mg total) by mouth daily.  Marland Kitchen atenolol-chlorthalidone (TENORETIC) 50-25 MG tablet TAKE 1 TABLET BY MOUTH EVERY DAY AT 11 AM  . Cholecalciferol (VITAMIN D3) 1.25 MG (50000 UT) CAPS Take 1 capsule by mouth on Tuesday and Friday  . Dulaglutide (TRULICITY) 4.66 ZL/9.3TT SOPN INJECT 0.5 ML(0.75 MG) SUBCUTANEOUSLY EVERY WEEK IN ABDOMEN, THIGH OR UPPER ARM ROTATING INJECTION SITE  . Lancets (ONETOUCH DELICA PLUS [id number]) MISC USE AS DIRECTED TO CHECK BLOOD SUGAR TWICE DAILY  . Magnesium 250 MG TABS Take 1 tablet (250 mg total) by mouth every evening. Take with dinner meal  . Multiple Vitamins-Minerals (ZINC PO) Take 1 tablet by mouth daily.   [name] test strip USE AS DIRECTED TO TEST BLOOD SUGAR TWICE DAILY  . Probiotic Product (PROBIOTIC PO) Take 1 capsule by mouth daily.   . rosuvastatin (CRESTOR) 20 MG tablet TAKE 1 TABLET(20 MG) BY MOUTH DAILY (Patient taking differently:  Take 20 mg by mouth daily. )   No facility-administered encounter medications on file as of 07/06/2020.     Goals Addressed              This Visit's Progress     Patient Stated   .  "I need dental care but can't afford it" (pt-stated)        CARE PLAN ENTRY (see longitudinal plan of care for additional care plan information)  Current Barriers:  . Financial constraints related to the cost of dental care . Limited knowledge of dental clinics and low cost offices in the surrounding area . Chronic conditions including DM II, HTN, and CKD III which put patient at increased risk for hospitalization  Social Work Clinical Goal(s):  Marland Kitchen Over the next 60 days the patient will work with SW to identify dental resources to assist with needed dental care  CCM SW Interventions: Completed 07/06/20 . Successful outbound call placed to the patient to assess goal progression . Determined the patient has not yet reviewed resources sent by SW . Provided the patient with a copy of resources via e-mail correspondence at the patients request . Discussed opportunity to access a [facility] counselor to discuss Medicare plans including dental rider benefits o Provided the patient with contact information . Scheduled follow up call over the next 3 weeks to assess goal progression  Completed 06/03/20 . Inter-disciplinary care team collaboration (see longitudinal plan of care) . Inbound call received from the patient who reports concern over needed extractions and fillings without the ability to  afford co-pay amounts at this time . Discussed local dental schools which may be able to provide assistance o [employer] [facility] [employer] does not perform extractions therefore the patient is not interested in applying for this program . Educated the patient on the [employer] of [facility] and clinic which is access by a lottery system . Successfully completed patient registration to enroll in [place] . Provided the  patient with information for [facility] which a low cost dental service in the event the patient would rather pay out of pocket than wait for the [place] to possibly draw her name . Educated the patient on the opportunity to add a dental rider to her health plan during open enrollment in order to have better dental coverage in the upcoming plan year . Scheduled follow up call to the patient over the next month to assess goal progression  Patient Self Care Activities:  . Patient verbalizes understanding of plan to review dental resources provided by SW . Self administers medications as prescribed . Attends all scheduled provider appointments . Calls pharmacy for medication refills . Performs ADL's independently . Calls provider office for new concerns or questions  Please see past updates related to this goal by clicking on the "Past Updates" button in the selected goal          Follow Up Plan: SW will follow up with patient by phone over the next 3 weeks   [name], BSW, CDP Social Worker, Certified Dementia Practitioner [place] [facility] [place] [facility] [phone]  Total time spent performing care coordination and/or care management activities with the patient by phone or face to face = 18 minutes.

## 2020-07-06 NOTE — Patient Instructions (Signed)
Social Worker Visit Information  Goals we discussed today:  Goals Addressed              This Visit's Progress     Patient Stated   .  "I need dental care but can't afford it" (pt-stated)        CARE PLAN ENTRY (see longitudinal plan of care for additional care plan information)  Current Barriers:  . Financial constraints related to the cost of dental care . Limited knowledge of dental clinics and low cost offices in the surrounding area . Chronic conditions including DM II, HTN, and CKD III which put patient at increased risk for hospitalization  Social Work Clinical Goal(s):  Marland Kitchen Over the next 60 days the patient will work with SW to identify dental resources to assist with needed dental care  CCM SW Interventions: Completed 07/06/20 . Successful outbound call placed to the patient to assess goal progression . Determined the patient has not yet reviewed resources sent by SW . Provided the patient with a copy of resources via e-mail correspondence at the patients request . Discussed opportunity to access a [facility] counselor to discuss Medicare plans including dental rider benefits o Provided the patient with contact information . Scheduled follow up call over the next 3 weeks to assess goal progression  Completed 06/03/20 . Inter-disciplinary care team collaboration (see longitudinal plan of care) . Inbound call received from the patient who reports concern over needed extractions and fillings without the ability to afford co-pay amounts at this time . Discussed local dental schools which may be able to provide assistance o [place] [employer] does not perform extractions therefore the patient is not interested in applying for this program . Educated the patient on the [employer] [facility] and clinic which is access by a lottery system . Successfully completed patient registration to enroll in [place] . Provided the patient with information for [facility]  which a low cost dental service in the event the patient would rather pay out of pocket than wait for the [place] to possibly draw her name . Educated the patient on the opportunity to add a dental rider to her health plan during open enrollment in order to have better dental coverage in the upcoming plan year . Scheduled follow up call to the patient over the next month to assess goal progression  Patient Self Care Activities:  . Patient verbalizes understanding of plan to review dental resources provided by SW . Self administers medications as prescribed . Attends all scheduled provider appointments . Calls pharmacy for medication refills . Performs ADL's independently . Calls provider office for new concerns or questions  Please see past updates related to this goal by clicking on the "Past Updates" button in the selected goal          Materials Provided: Yes: provided verbal and written information on dental resources  Follow Up Plan: SW will follow up with patient by phone over the next three weeks.   [name], BSW, CDP Social Worker, Certified Dementia Practitioner [place] [facility] [place] [facility] [phone]

## 2020-07-08 ENCOUNTER — Telehealth: Payer: Self-pay

## 2020-07-12 ENCOUNTER — Other Ambulatory Visit: Payer: Self-pay | Admitting: Internal Medicine

## 2020-07-12 DIAGNOSIS — Z0000 Encounter for general adult medical examination without abnormal findings: Secondary | ICD-10-CM

## 2020-07-28 ENCOUNTER — Ambulatory Visit: Payer: Medicare Other

## 2020-07-28 DIAGNOSIS — I10 Essential (primary) hypertension: Secondary | ICD-10-CM

## 2020-07-28 DIAGNOSIS — N183 Chronic kidney disease, stage 3 unspecified: Secondary | ICD-10-CM

## 2020-07-28 DIAGNOSIS — E1122 Type 2 diabetes mellitus with diabetic chronic kidney disease: Secondary | ICD-10-CM

## 2020-07-28 NOTE — Chronic Care Management (AMB) (Signed)
Chronic Care Management    Social Work Follow Up Note  07/28/2020 Name: [name] MRN: [id number] DOB: [birth year]  [name] is a 79 y.o. year old female who is a primary care patient of [name], MD. The CCM team was consulted for assistance with care coordination.   Review of patient status, including review of consultants reports, other relevant assessments, and collaboration with appropriate care team members and the patient's provider was performed as part of comprehensive patient evaluation and provision of chronic care management services.    SDOH (Social Determinants of Health) assessments performed: No    Outpatient Encounter Medications as of 07/28/2020  Medication Sig  . allopurinol (ZYLOPRIM) 300 MG tablet TAKE 1 TABLET(300 MG) BY MOUTH DAILY (Patient taking differently: Take 300 mg by mouth daily. )  . amLODipine-benazepril (LOTREL) 5-10 MG capsule TAKE 1 CAPSULE BY MOUTH EVERY DAY (Patient taking differently: Take 1 capsule by mouth daily. )  . aspirin EC 81 MG tablet Take 1 tablet (81 mg total) by mouth daily.  Marland Kitchen atenolol-chlorthalidone (TENORETIC) 50-25 MG tablet TAKE 1 TABLET BY MOUTH EVERY DAY AT 11 AM  . Cholecalciferol (VITAMIN D3) 1.25 MG (50000 UT) CAPS Take 1 capsule by mouth on Tuesday and Friday  . Dulaglutide (TRULICITY) 1.88 CZ/6.6AY SOPN INJECT 0.5 ML(0.75 MG) SUBCUTANEOUSLY EVERY WEEK IN ABDOMEN, THIGH OR UPPER ARM ROTATING INJECTION SITE  . Lancets (ONETOUCH DELICA PLUS [id number]) MISC USE AS DIRECTED TO CHECK BLOOD SUGAR TWICE DAILY  . Magnesium 250 MG TABS Take 1 tablet (250 mg total) by mouth every evening. Take with dinner meal  . Multiple Vitamins-Minerals (ZINC PO) Take 1 tablet by mouth daily.   [name] test strip USE AS DIRECTED TO TEST BLOOD SUGAR TWICE DAILY  . Probiotic Product (PROBIOTIC PO) Take 1 capsule by mouth daily.   . rosuvastatin (CRESTOR) 20 MG tablet TAKE 1 TABLET(20 MG) BY MOUTH DAILY (Patient taking differently:  Take 20 mg by mouth daily. )   No facility-administered encounter medications on file as of 07/28/2020.     Goals Addressed              This Visit's Progress     Patient Stated   .  "I need dental care but can't afford it" (pt-stated)   On track     [place] (see longitudinal plan of care for additional care plan information)  Current Barriers:  . Financial constraints related to the cost of dental care . Limited knowledge of dental clinics and low cost offices in the surrounding area . Chronic conditions including DM II, HTN, and CKD III which put patient at increased risk for hospitalization  Social Work Clinical Goal(s):  Marland Kitchen Over the next 60 days the patient will work with SW to identify dental resources to assist with needed dental care  CCM SW Interventions: Completed 07/28/20 . Successful outbound call placed to the patient to assess goal progression . Determined the patient has yet to contact [facility] for Medicare health plan information . Discussed the patient has been in contact with her health plan and is awaiting information mailed to her regarding 2022 plan benefit changes for vision and dental o The patient reports she is overall very happy with [facility] and is hopeful the plan changes will meet her dental need o The patient also reports she most likely is in need of eye glasses and is interested in vision plan changes . Provided the patient with the direct number to [employer]  [facility] [place] for [facility] counseling should she feel more information is needed after receiving mailed resource information . Scheduled follow up to the patient over the next month  Completed 07/06/20 . Successful outbound call placed to the patient to assess goal progression . Determined the patient has not yet reviewed resources sent by SW . Provided the patient with a copy of resources via e-mail correspondence at the patients request . Discussed opportunity to access a [facility] counselor to  discuss Medicare plans including dental rider benefits o Provided the patient with contact information . Scheduled follow up call over the next 3 weeks to assess goal progression  Completed 06/03/20 . Inter-disciplinary care team collaboration (see longitudinal plan of care) . Inbound call received from the patient who reports concern over needed extractions and fillings without the ability to afford co-pay amounts at this time . Discussed local dental schools which may be able to provide assistance o [place] [employer] does not perform extractions therefore the patient is not interested in applying for this program . Educated the patient on the [employer] of [facility] and clinic which is access by a lottery system . Successfully completed patient registration to enroll in [place] . Provided the patient with information for [facility] which a low cost dental service in the event the patient would rather pay out of pocket than wait for the [place] to possibly draw her name . Educated the patient on the opportunity to add a dental rider to her health plan during open enrollment in order to have better dental coverage in the upcoming plan year . Scheduled follow up call to the patient over the next month to assess goal progression  Patient Self Care Activities:  . Patient verbalizes understanding of plan to review dental resources provided by SW . Self administers medications as prescribed . Attends all scheduled provider appointments . Calls pharmacy for medication refills . Performs ADL's independently . Calls provider office for new concerns or questions  Please see past updates related to this goal by clicking on the "Past Updates" button in the selected goal        Other   .  Collaborate with embedded Pharmacist to assist with care coordination needs        CARE PLAN ENTRY (see longitudinal plan of care for additional care plan information)  Current  Barriers:  . Chronic disease management support and education needs related to medication management and assistance . Not currently active with embedded pharmacist . DM II, HTN, CKD III  Social Work Clinical Goal(s):  Marland Kitchen Over the next 90 days, patient will work with embedded Pharmacist to address needs related to medication management and assistance  CCM SW Interventions: Completed 07/28/20 . Inter-disciplinary care team collaboration (see longitudinal plan of care) . Successful outbound call placed to the patient to assist with care coordination needs . Determined the patient continues to receive Trulicity through patient assistance o The patient requests feedback on how to set up automatic refills versus calling each time a refill is needed . Performed chart review to note patient previously involved with embedded PharmD Lottie Dawson regarding medication assistance o Advised the patient Mrs. Pruitt no longer with this practice but new Pharmacist Jannette Fogo would be happy to work with patient . Collaboration with Jannette Fogo who stated patient may contact patient assistance directly to request automatic refills o Outbound call placed to the patient to advise of how to order automatic refills o  Advised the patient Jannette Fogo embedded pharmacist would follow up with her to assist with long-term medication management needs . Collaboration with Kandra Nicolas to request scheduling assistance  Patient Self Care Activities:  . Patient verbalizes understanding of plan to work with embedded pharmacist to address long term medication management needs . Self administers medications as prescribed . Attends all scheduled provider appointments . Performs ADL's independently . Performs IADL's independently . Calls provider office for new concerns or questions  Initial goal documentation         Follow Up Plan: SW will follow up with patient by phone over the next month.   [name], BSW, CDP Social Worker, Certified Dementia Practitioner [place] [facility] [place] [facility] [phone]  Total time spent performing care coordination and/or care management activities with the patient by phone or face to face = 18 minutes.

## 2020-07-28 NOTE — Patient Instructions (Signed)
Social Worker Visit Information  Goals we discussed today:  Goals Addressed              This Visit's Progress     Patient Stated   .  "I need dental care but can't afford it" (pt-stated)   On track     [place] (see longitudinal plan of care for additional care plan information)  Current Barriers:  . Financial constraints related to the cost of dental care . Limited knowledge of dental clinics and low cost offices in the surrounding area . Chronic conditions including DM II, HTN, and CKD III which put patient at increased risk for hospitalization  Social Work Clinical Goal(s):  Marland Kitchen Over the next 60 days the patient will work with SW to identify dental resources to assist with needed dental care  CCM SW Interventions: Completed 07/28/20 . Successful outbound call placed to the patient to assess goal progression . Determined the patient has yet to contact [facility] for Medicare health plan information . Discussed the patient has been in contact with her health plan and is awaiting information mailed to her regarding 2022 plan benefit changes for vision and dental o The patient reports she is overall very happy with [facility] and is hopeful the plan changes will meet her dental need o The patient also reports she most likely is in need of eye glasses and is interested in vision plan changes . Provided the patient with the direct number to [employer] [facility] [place] for [employer] counseling should she feel more information is needed after receiving mailed resource information . Scheduled follow up to the patient over the next month  Completed 07/06/20 . Successful outbound call placed to the patient to assess goal progression . Determined the patient has not yet reviewed resources sent by SW . Provided the patient with a copy of resources via e-mail correspondence at the patients request . Discussed opportunity to access a [facility] counselor to discuss Medicare plans including dental rider  benefits o Provided the patient with contact information . Scheduled follow up call over the next 3 weeks to assess goal progression  Completed 06/03/20 . Inter-disciplinary care team collaboration (see longitudinal plan of care) . Inbound call received from the patient who reports concern over needed extractions and fillings without the ability to afford co-pay amounts at this time . Discussed local dental schools which may be able to provide assistance o [place] [facility] [employer] does not perform extractions therefore the patient is not interested in applying for this program . Educated the patient on the [employer] of [facility] and clinic which is access by a lottery system . Successfully completed patient registration to enroll in [place] . Provided the patient with information for [facility] which a low cost dental service in the event the patient would rather pay out of pocket than wait for the [place] to possibly draw her name . Educated the patient on the opportunity to add a dental rider to her health plan during open enrollment in order to have better dental coverage in the upcoming plan year . Scheduled follow up call to the patient over the next month to assess goal progression  Patient Self Care Activities:  . Patient verbalizes understanding of plan to review dental resources provided by SW . Self administers medications as prescribed . Attends all scheduled provider appointments . Calls pharmacy for medication refills . Performs ADL's independently . Calls provider office for new concerns or  questions  Please see past updates related to this goal by clicking on the "Past Updates" button in the selected goal        Other   .  Collaborate with embedded Pharmacist to assist with care coordination needs        CARE PLAN ENTRY (see longitudinal plan of care for additional care plan information)  Current Barriers:  . Chronic disease management  support and education needs related to medication management and assistance . Not currently active with embedded pharmacist . DM II, HTN, CKD III  Social Work Clinical Goal(s):  Marland Kitchen Over the next 90 days, patient will work with embedded Pharmacist to address needs related to medication management and assistance  CCM SW Interventions: Completed 07/28/20 . Inter-disciplinary care team collaboration (see longitudinal plan of care) . Successful outbound call placed to the patient to assist with care coordination needs . Determined the patient continues to receive Trulicity through patient assistance o The patient requests feedback on how to set up automatic refills versus calling each time a refill is needed . Performed chart review to note patient previously involved with embedded PharmD Lottie Dawson regarding medication assistance o Advised the patient Mrs. Pruitt no longer with this practice but new Pharmacist Jannette Fogo would be happy to work with patient . Collaboration with Jannette Fogo who stated patient may contact patient assistance directly to request automatic refills o Outbound call placed to the patient to advise of how to order automatic refills o Advised the patient Jannette Fogo embedded pharmacist would follow up with her to assist with long-term medication management needs . Collaboration with Kandra Nicolas to request scheduling assistance  Patient Self Care Activities:  . Patient verbalizes understanding of plan to work with embedded pharmacist to address long term medication management needs . Self administers medications as prescribed . Attends all scheduled provider appointments . Performs ADL's independently . Performs IADL's independently . Calls provider office for new concerns or questions  Initial goal documentation         Follow Up Plan: SW will follow up with patient by phone over the next month.   [name], BSW, CDP Social Worker,  Certified Dementia Practitioner [place] [facility] [place] [facility] [phone]

## 2020-07-29 ENCOUNTER — Ambulatory Visit: Payer: Medicare Other

## 2020-08-03 ENCOUNTER — Telehealth: Payer: Self-pay

## 2020-08-03 NOTE — Chronic Care Management (AMB) (Signed)
Chronic Care Management Pharmacy Assistant   Name: [name]  MRN: [id number] DOB: [birth year]  Reason for Encounter: Medication Review - Initial Questions for Pharmacist visit on 08/11/2020.   PCP : [name], MD  Allergies:   Allergies  Allergen Reactions  . Atorvastatin Calcium Itching  . Bee Venom Hives  . Oxycodone Other (See Comments)    hallucinations  . Vicodin [Hydrocodone-Acetaminophen] Other (See Comments)    hallucinations    Medications: Outpatient Encounter Medications as of 08/03/2020  Medication Sig  . allopurinol (ZYLOPRIM) 300 MG tablet TAKE 1 TABLET(300 MG) BY MOUTH DAILY (Patient taking differently: Take 300 mg by mouth daily. )  . amLODipine-benazepril (LOTREL) 5-10 MG capsule TAKE 1 CAPSULE BY MOUTH EVERY DAY (Patient taking differently: Take 1 capsule by mouth daily. )  . aspirin EC 81 MG tablet Take 1 tablet (81 mg total) by mouth daily.  Marland Kitchen atenolol-chlorthalidone (TENORETIC) 50-25 MG tablet TAKE 1 TABLET BY MOUTH EVERY DAY AT 11 AM  . Cholecalciferol (VITAMIN D3) 1.25 MG (50000 UT) CAPS Take 1 capsule by mouth on Tuesday and Friday  . Dulaglutide (TRULICITY) 5.73 UK/0.2RK SOPN INJECT 0.5 ML(0.75 MG) SUBCUTANEOUSLY EVERY WEEK IN ABDOMEN, THIGH OR UPPER ARM ROTATING INJECTION SITE  . Lancets (ONETOUCH DELICA PLUS [id number]) MISC USE AS DIRECTED TO CHECK BLOOD SUGAR TWICE DAILY  . Magnesium 250 MG TABS Take 1 tablet (250 mg total) by mouth every evening. Take with dinner meal  . Multiple Vitamins-Minerals (ZINC PO) Take 1 tablet by mouth daily.   [name] test strip USE AS DIRECTED TO TEST BLOOD SUGAR TWICE DAILY  . Probiotic Product (PROBIOTIC PO) Take 1 capsule by mouth daily.   . rosuvastatin (CRESTOR) 20 MG tablet TAKE 1 TABLET(20 MG) BY MOUTH DAILY (Patient taking differently: Take 20 mg by mouth daily. )   No facility-administered encounter medications on file as of 08/03/2020.    Current Diagnosis: Patient Active Problem List    Diagnosis Date Noted  . Vitamin D deficiency 06/01/2020  . CAP (community acquired pneumonia) 05/06/2020  . Abdominal bloating 02/17/2019  . Urinary frequency 02/17/2019  . Uncontrolled type 2 diabetes mellitus with hyperglycemia ([place]) 02/17/2019  . Lightheaded 02/17/2019  . Constipation 06/26/2018  . Hyperlipidemia 08/23/2014  . Type 2 diabetes mellitus with stage 3 chronic kidney disease, without long-term current use of insulin ([place]) 06/19/2014  . Mixed hyperlipidemia 06/19/2014  . S/P urological surgery 09/30/2013  . Joint pain 09/30/2013  . UTI (urinary tract infection) 09/30/2013  . Cough 12/10/2012  . OA (osteoarthritis) of knee 01/05/2012  . 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 Vaginal discharge 08/02/2011  . Spasm of lumbar paraspinous muscle 06/17/2011  . Morbidly obese ([place]) 05/31/2011  . HIP PAIN, LEFT, CHRONIC 11/09/2010  . CARPAL TUNNEL SYNDROME, RIGHT 10/14/2010  . KNEE PAIN, RIGHT, CHRONIC 10/14/2010  . LEFT BUNDLE BRANCH BLOCK 07/26/2010  . 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 FATIGUE 07/01/2010  . CHEST PAIN, ATYPICAL 07/01/2010  . EPIGASTRIC PAIN 07/01/2010  . SUPRAPUBIC PAIN 07/01/2010  . HYPERGLYCEMIA 07/01/2010  . CELLULITIS AND ABSCESS OF OTHER SPECIFIED SITE 02/15/2010  . LUMP OR MASS IN BREAST 01/10/2010  . DYSURIA, CHRONIC 03/09/2009  . GERD 10/06/2008  . Personal history of colonic polyps-adenoma 08/26/2008  . LOC OSTEOARTHROS NOT SPEC PRIM/SEC LOWER LEG 09/10/2007  . GOUT, UNSPECIFIED 09/09/2007  . DIVERTICULOSIS, COLON 09/09/2007  . DIVERTICULITIS, HX OF 09/09/2007  . HYPERLIPIDEMIA 06/03/2007  . Essential hypertension 06/03/2007   08/03/2020 -  Called patient left message to call me back  08/05/2020 - Called patient man answered and said had wrong number.  08/23/2020 - Called patient she answered phone, said that she would like for me to call her back in afternoon after 1:00 pm.  08/23/2020 - Called patient left  message to call me back.  Follow-Up:  Patient Assistance Coordination - Called patient to do initial questions , appointment was moved from 08/11/2020 to 09/30/2020.   Beverly Milch, CPP . Notified   [name], [place] Pharmacist Assistant [phone]

## 2020-08-04 ENCOUNTER — Telehealth: Payer: Self-pay | Admitting: Internal Medicine

## 2020-08-04 NOTE — Progress Notes (Signed)
  Chronic Care Management   Outreach Note  08/04/2020 Name: [name] MRN: [id number] DOB: 1941-08-25  Referred by: [name], MD Reason for referral : No chief complaint on file.   An unsuccessful telephone outreach was attempted today. The patient was referred to the pharmacist for assistance with care management and care coordination.   Follow Up Plan:   Carley Perdue [employer] Scheduler

## 2020-08-05 ENCOUNTER — Telehealth: Payer: Self-pay | Admitting: Internal Medicine

## 2020-08-05 NOTE — Progress Notes (Signed)
  Chronic Care Management   Outreach Note  08/05/2020 Name: [name] MRN: [id number] DOB: 1941-04-04  Referred by: [name], MD Reason for referral : No chief complaint on file.   A second unsuccessful telephone outreach was attempted today. The patient was referred to pharmacist for assistance with care management and care coordination.  Follow Up Plan:   Carley Perdue [employer] Scheduler

## 2020-08-06 ENCOUNTER — Telehealth: Payer: Self-pay | Admitting: Internal Medicine

## 2020-08-06 NOTE — Progress Notes (Signed)
  Chronic Care Management   Note  08/06/2020 Name: [name] MRN: [id number] DOB: 06-18-1941  [name] is a 79 y.o. year old female who is a primary care patient of [name], MD. I reached out to [name] by phone today in response to a referral sent by [name]'s PCP, [name], MD.   [name] was given information about Chronic Care Management services today including:  1. CCM service includes personalized support from designated clinical staff supervised by her physician, including individualized plan of care and coordination with other care providers 2. 24/7 contact phone numbers for assistance for urgent and routine care needs. 3. Service will only be billed when office clinical staff spend 20 minutes or more in a month to coordinate care. 4. Only one practitioner may furnish and bill the service in a calendar month. 5. The patient may stop CCM services at any time (effective at the end of the month) by phone call to the office staff.   Patient agreed to services and verbal consent obtained.   Follow up plan:   Carley Perdue [employer] Scheduler

## 2020-08-10 ENCOUNTER — Other Ambulatory Visit: Payer: Self-pay | Admitting: Nurse Practitioner

## 2020-08-10 ENCOUNTER — Other Ambulatory Visit: Payer: Self-pay | Admitting: Internal Medicine

## 2020-08-10 DIAGNOSIS — R300 Dysuria: Secondary | ICD-10-CM

## 2020-08-10 DIAGNOSIS — N390 Urinary tract infection, site not specified: Secondary | ICD-10-CM

## 2020-08-11 ENCOUNTER — Telehealth: Payer: Medicare Other

## 2020-08-15 ENCOUNTER — Other Ambulatory Visit: Payer: Self-pay | Admitting: Internal Medicine

## 2020-08-16 ENCOUNTER — Telehealth: Payer: Medicare Other

## 2020-08-24 ENCOUNTER — Other Ambulatory Visit: Payer: Self-pay | Admitting: Nurse Practitioner

## 2020-08-24 DIAGNOSIS — N183 Chronic kidney disease, stage 3 unspecified: Secondary | ICD-10-CM

## 2020-08-24 DIAGNOSIS — E1122 Type 2 diabetes mellitus with diabetic chronic kidney disease: Secondary | ICD-10-CM

## 2020-08-26 ENCOUNTER — Other Ambulatory Visit: Payer: Self-pay

## 2020-08-26 ENCOUNTER — Ambulatory Visit
Admission: RE | Admit: 2020-08-26 | Discharge: 2020-08-26 | Disposition: A | Discharge status: Home / Self Care | Payer: Medicare Other | Source: Ambulatory Visit | Attending: Internal Medicine | Admitting: Internal Medicine

## 2020-08-26 DIAGNOSIS — Z0000 Encounter for general adult medical examination without abnormal findings: Secondary | ICD-10-CM

## 2020-08-26 DIAGNOSIS — Z1231 Encounter for screening mammogram for malignant neoplasm of breast: Secondary | ICD-10-CM | POA: Diagnosis not present

## 2020-08-26 IMAGING — MG DIGITAL SCREENING BILAT W/ TOMO W/ CAD
8 series · 8 of 24 positions shown · non-contrast
Comparison: Previous exam(s).

CLINICAL DATA: Screening.

EXAM:
DIGITAL SCREENING BILATERAL MAMMOGRAM WITH TOMO AND CAD

[L MLO synth-2D]
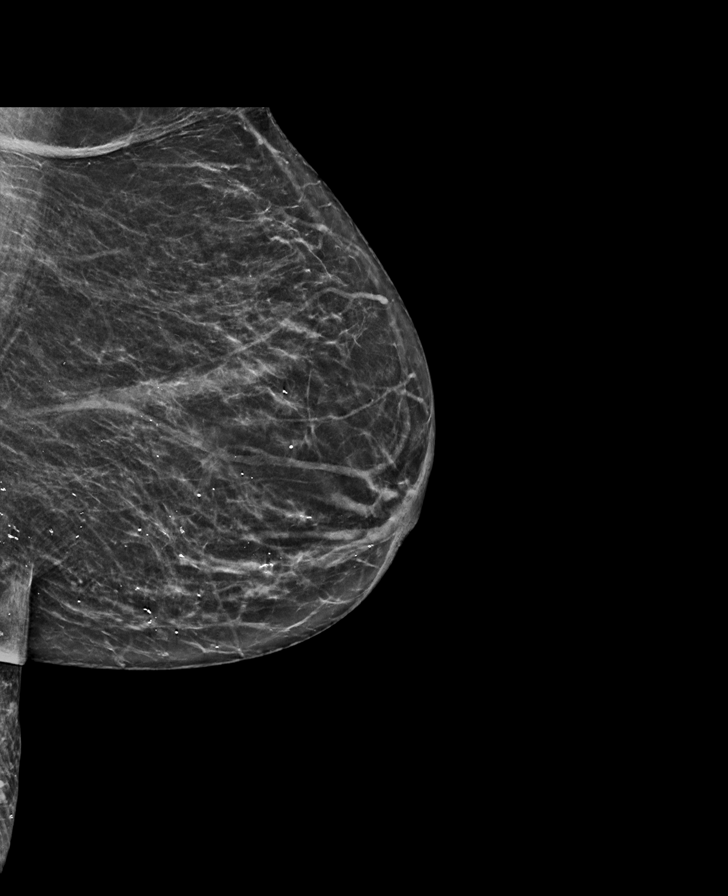

[L CC synth-2D]
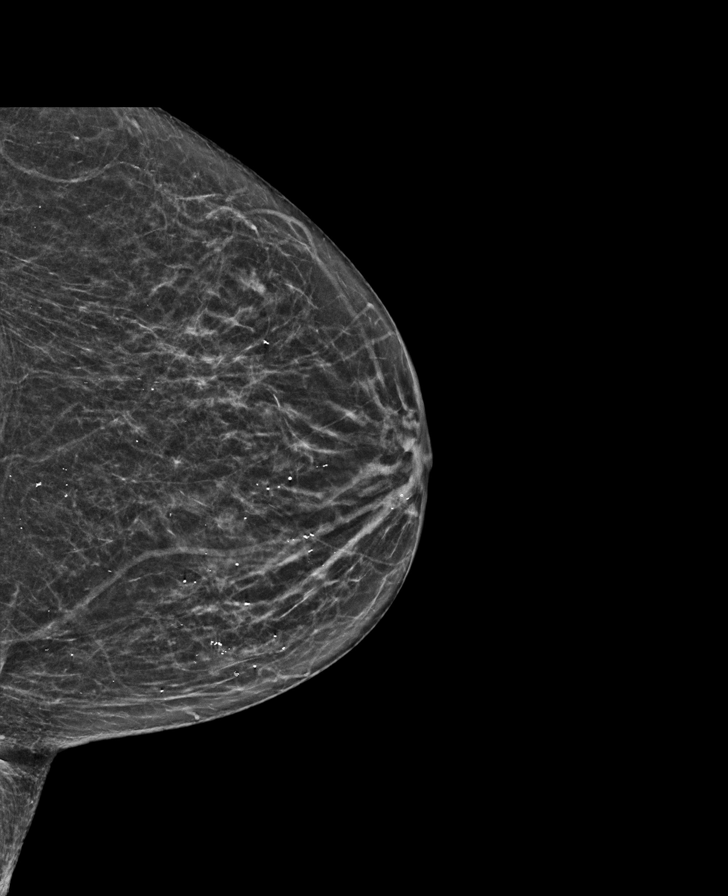

[R MLO synth-2D]
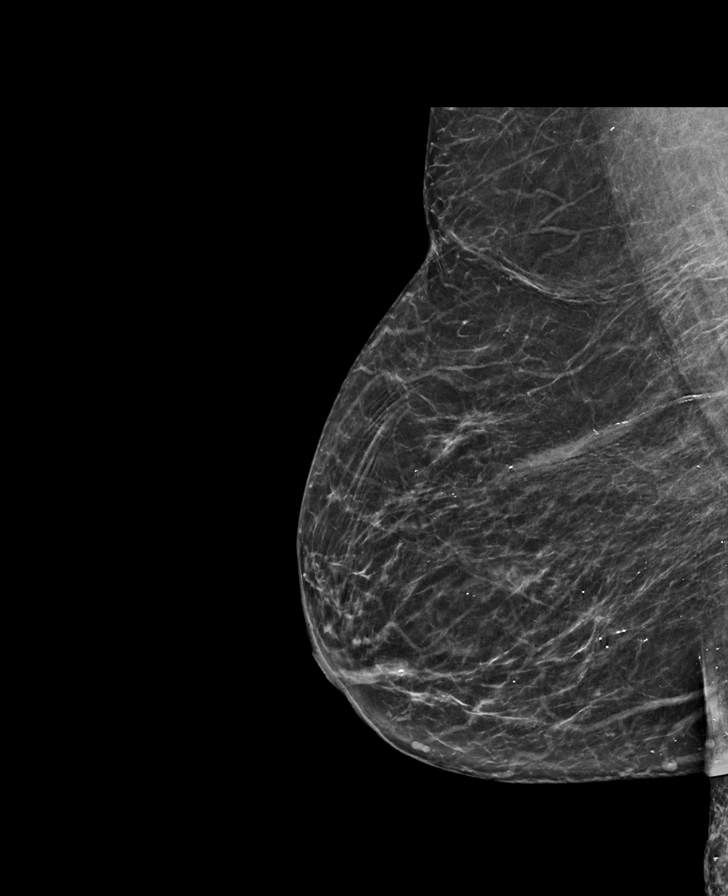

[R CC synth-2D]
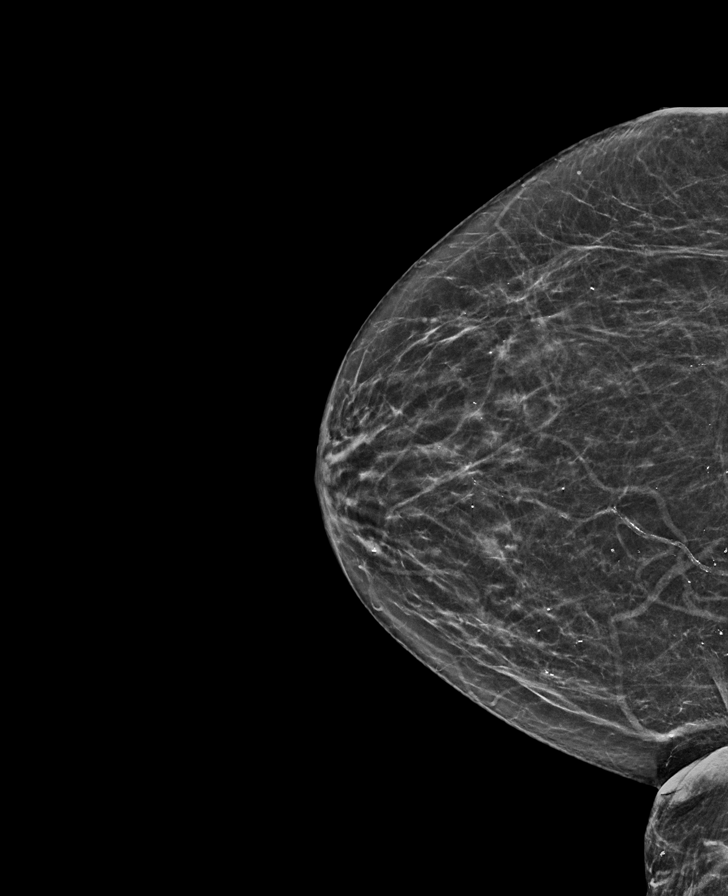

[L MLO tomo · tomo slice 30/59.0]
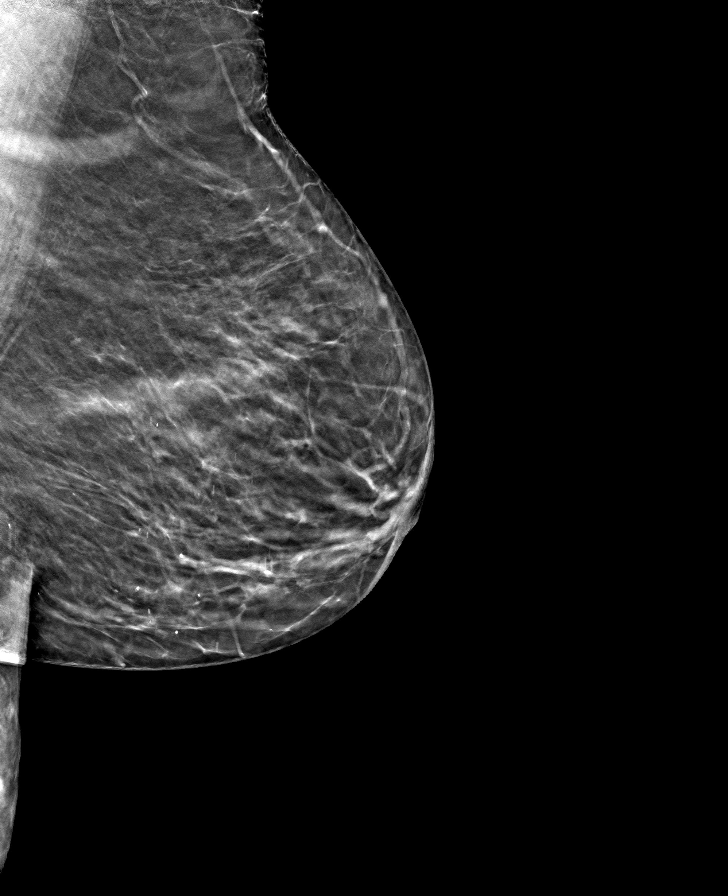

[L CC tomo · tomo slice 27/53.0]
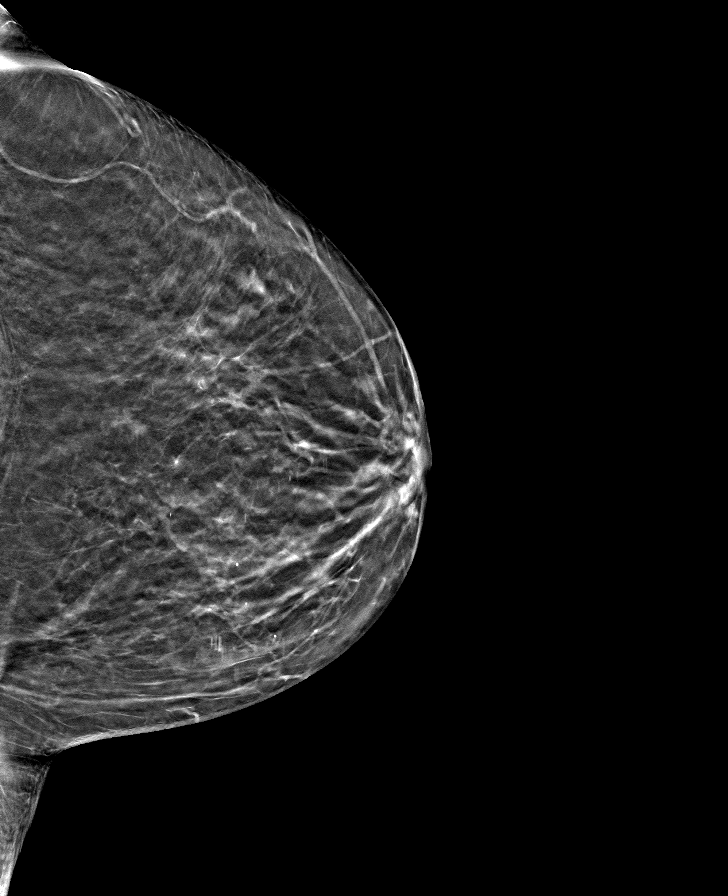

[R MLO tomo · tomo slice 33/65.0]
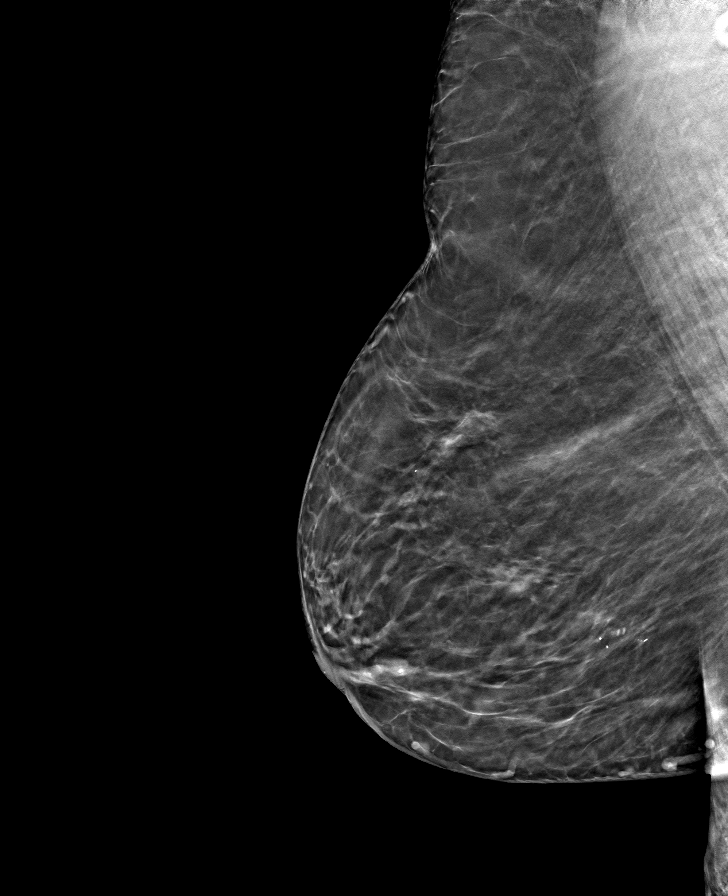

[R CC tomo · tomo slice 29/57.0]
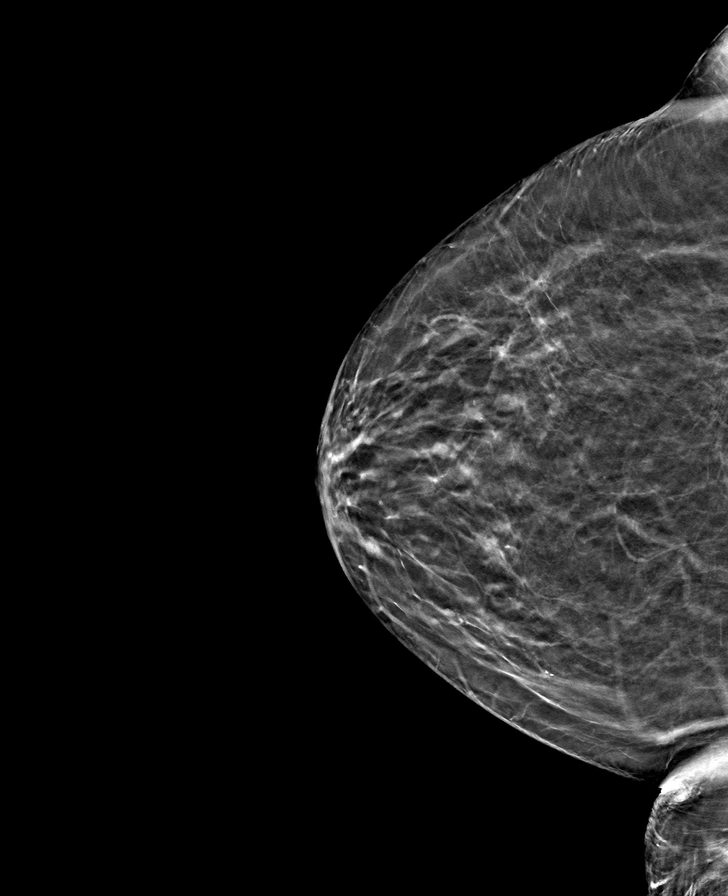

[8 of 24 positions shown; findings below may reference images not displayed]

ACR Breast Density Category b: There are scattered areas of
fibroglandular density.
FINDINGS: There are no findings suspicious for malignancy. Images were
processed with CAD.
IMPRESSION: No mammographic evidence of malignancy. A result letter of this
screening mammogram will be mailed directly to the patient.

RECOMMENDATION:
Screening mammogram in one year. (Code:[TQ])

BI-RADS CATEGORY  1: Negative.

## 2020-08-27 ENCOUNTER — Ambulatory Visit: Payer: Medicare Other

## 2020-08-27 DIAGNOSIS — E1122 Type 2 diabetes mellitus with diabetic chronic kidney disease: Secondary | ICD-10-CM

## 2020-08-27 DIAGNOSIS — I10 Essential (primary) hypertension: Secondary | ICD-10-CM

## 2020-08-27 DIAGNOSIS — N183 Chronic kidney disease, stage 3 unspecified: Secondary | ICD-10-CM

## 2020-08-27 NOTE — Patient Instructions (Signed)
Social Worker Visit Information  Goals we discussed today:  Goals Addressed            This Visit's Progress   . "I need help getting my medicine refilled"       CARE PLAN ENTRY (see longitudinal plan of care for additional care plan information)  Current Barriers:  . Difficulty getting pharmacy to refill medications . Lacks knowledge of whether or not she is still supposed to take Pantoprazole . Chronic conditions including DM II, HTN, and CKD III which put patient at increased risk for hospitalization  Social Work Clinical Goal(s):  Marland Kitchen Over the next 20 days the patient will follow up with primary provider office as directed by SW to obtain needed refills  CCM SW Interventions: Completed 08/27/20 . Inter-disciplinary care team collaboration (see longitudinal plan of care) . Successful outbound call placed to the patient to assist with care coordination needs . Determined the patient is in need of refills for Allopurinol, Atenolol, and Pantoprazole o The patient reports she has contacted [facility] x 3 without success of obtaining medications . Performed chart review to note both Allopurinol and Atenolol refills sent to [facility] in early October . Advised the patient Pantoprazole is not listed on her current medication list o Patient reports she has been taking medication daily and would like to know if this is appropriate o Advised the patient SW would collaborate with primary provider to request patient outreach regarding active medications and refills . Collaboration with Dr Baird Cancer to request patient follow up regarding refill needs and whether the patient should continue pantoprazole . Scheduled follow up call over the next month  Patient Self Care Activities:  . Self administers medications as prescribed . Attends all scheduled provider appointments . Performs ADL's independently . Performs IADL's independently . Calls provider office for new concerns or  questions  Initial goal documentation         Follow Up Plan: SW will follow up with patient by phone over the next month.  [name], BSW, CDP Social Worker, Certified Dementia Practitioner [place] [facility] [place] [facility] [phone]

## 2020-08-27 NOTE — Chronic Care Management (AMB) (Signed)
Chronic Care Management    Social Work Follow Up Note  08/27/2020 Name: [name] MRN: [id number] DOB: 07/13/1941  [name] is a 79 y.o. year old female who is a primary care patient of [name], MD. The CCM team was consulted for assistance with care coordination.   Review of patient status, including review of consultants reports, other relevant assessments, and collaboration with appropriate care team members and the patient's provider was performed as part of comprehensive patient evaluation and provision of chronic care management services.    SDOH (Social Determinants of Health) assessments performed: No    Outpatient Encounter Medications as of 08/27/2020  Medication Sig  . allopurinol (ZYLOPRIM) 300 MG tablet TAKE 1 TABLET(300 MG) BY MOUTH DAILY  . amLODipine-benazepril (LOTREL) 5-10 MG capsule TAKE 1 CAPSULE BY MOUTH EVERY DAY  . aspirin EC 81 MG tablet Take 1 tablet (81 mg total) by mouth daily.  Marland Kitchen atenolol-chlorthalidone (TENORETIC) 50-25 MG tablet TAKE 1 TABLET BY MOUTH EVERY DAY AT 11 AM  . Cholecalciferol (VITAMIN D3) 1.25 MG (50000 UT) CAPS Take 1 capsule by mouth on Tuesday and Friday  . Dulaglutide (TRULICITY) 9.98 PJ/8.2NK SOPN INJECT 0.5 ML(0.75 MG) SUBCUTANEOUSLY EVERY WEEK IN ABDOMEN, THIGH OR UPPER ARM ROTATING INJECTION SITE  . Lancets (ONETOUCH DELICA PLUS [id number]) MISC USE AS DIRECTED TO CHECK BLOOD SUGAR TWICE DAILY  . Magnesium 250 MG TABS Take 1 tablet (250 mg total) by mouth every evening. Take with dinner meal  . Multiple Vitamins-Minerals (ZINC PO) Take 1 tablet by mouth daily.   [name] test strip USE AS DIRECTED TO TEST BLOOD SUGAR TWICE DAILY  . Probiotic Product (PROBIOTIC PO) Take 1 capsule by mouth daily.   . rosuvastatin (CRESTOR) 20 MG tablet TAKE 1 TABLET(20 MG) BY MOUTH DAILY   No facility-administered encounter medications on file as of 08/27/2020.     Goals Addressed            This Visit's Progress   . "I  need help getting my medicine refilled"       CARE PLAN ENTRY (see longitudinal plan of care for additional care plan information)  Current Barriers:  . Difficulty getting pharmacy to refill medications . Lacks knowledge of whether or not she is still supposed to take Pantoprazole . Chronic conditions including DM II, HTN, and CKD III which put patient at increased risk for hospitalization  Social Work Clinical Goal(s):  Marland Kitchen Over the next 20 days the patient will follow up with primary provider office as directed by SW to obtain needed refills  CCM SW Interventions: Completed 08/27/20 . Inter-disciplinary care team collaboration (see longitudinal plan of care) . Successful outbound call placed to the patient to assist with care coordination needs . Determined the patient is in need of refills for Allopurinol, Atenolol, and Pantoprazole o The patient reports she has contacted [facility] x [facility] without success of obtaining medications . Performed chart review to note both Allopurinol and Atenolol refills sent to [facility] in early October . Advised the patient Pantoprazole is not listed on her current medication list o Patient reports she has been taking medication daily and would like to know if this is appropriate o Advised the patient SW would collaborate with primary provider to request patient outreach regarding active medications and refills . Collaboration with Dr Baird Cancer to request patient follow up regarding refill needs and whether the patient should continue pantoprazole . Scheduled follow up call over the next month  Patient Self Care  Activities:  . Self administers medications as prescribed . Attends all scheduled provider appointments . Performs ADL's independently . Performs IADL's independently . Calls provider office for new concerns or questions  Initial goal documentation         Follow Up Plan: SW will follow up with patient by phone over the next month.   [name], BSW, CDP Social Worker, Certified Dementia Practitioner [place] [facility] [place] [facility] [phone]  Total time spent performing care coordination and/or care management activities with the patient by phone or face to face = 22 minutes.

## 2020-08-28 ENCOUNTER — Other Ambulatory Visit: Payer: Self-pay

## 2020-08-28 ENCOUNTER — Encounter (HOSPITAL_BASED_OUTPATIENT_CLINIC_OR_DEPARTMENT_OTHER): Payer: Self-pay | Admitting: *Deleted

## 2020-08-28 ENCOUNTER — Emergency Department (HOSPITAL_BASED_OUTPATIENT_CLINIC_OR_DEPARTMENT_OTHER)
Admission: EM | Admit: 2020-08-28 | Discharge: 2020-08-28 | Disposition: A | Discharge status: Home / Self Care | Payer: Medicare Other | Attending: Emergency Medicine | Admitting: Emergency Medicine

## 2020-08-28 DIAGNOSIS — Z794 Long term (current) use of insulin: Secondary | ICD-10-CM | POA: Insufficient documentation

## 2020-08-28 DIAGNOSIS — N3000 Acute cystitis without hematuria: Secondary | ICD-10-CM | POA: Diagnosis not present

## 2020-08-28 DIAGNOSIS — Z96652 Presence of left artificial knee joint: Secondary | ICD-10-CM | POA: Diagnosis not present

## 2020-08-28 DIAGNOSIS — E1122 Type 2 diabetes mellitus with diabetic chronic kidney disease: Secondary | ICD-10-CM | POA: Insufficient documentation

## 2020-08-28 DIAGNOSIS — I129 Hypertensive chronic kidney disease with stage 1 through stage 4 chronic kidney disease, or unspecified chronic kidney disease: Secondary | ICD-10-CM | POA: Insufficient documentation

## 2020-08-28 DIAGNOSIS — N183 Chronic kidney disease, stage 3 unspecified: Secondary | ICD-10-CM | POA: Diagnosis not present

## 2020-08-28 DIAGNOSIS — E1165 Type 2 diabetes mellitus with hyperglycemia: Secondary | ICD-10-CM | POA: Insufficient documentation

## 2020-08-28 DIAGNOSIS — Z7982 Long term (current) use of aspirin: Secondary | ICD-10-CM | POA: Diagnosis not present

## 2020-08-28 DIAGNOSIS — R300 Dysuria: Secondary | ICD-10-CM | POA: Diagnosis present

## 2020-08-28 DIAGNOSIS — Z79899 Other long term (current) drug therapy: Secondary | ICD-10-CM | POA: Insufficient documentation

## 2020-08-28 LAB — CBC WITH DIFFERENTIAL/PLATELET
Abs Immature Granulocytes: 0.08 10*3/uL — ABNORMAL HIGH (ref 0.00–0.07)
Basophils Absolute: 0.1 10*3/uL (ref 0.0–0.1)
Basophils Relative: 1 %
Eosinophils Absolute: 0.5 10*3/uL (ref 0.0–0.5)
Eosinophils Relative: 4 %
HCT: 41.3 % (ref 36.0–46.0)
Hemoglobin: 13.9 g/dL (ref 12.0–15.0)
Immature Granulocytes: 1 %
Lymphocytes Relative: 26 %
Lymphs Abs: 2.9 10*3/uL (ref 0.7–4.0)
MCH: 30.4 pg (ref 26.0–34.0)
MCHC: 33.7 g/dL (ref 30.0–36.0)
MCV: 90.4 fL (ref 80.0–100.0)
Monocytes Absolute: 1 10*3/uL (ref 0.1–1.0)
Monocytes Relative: 9 %
Neutro Abs: 6.6 10*3/uL (ref 1.7–7.7)
Neutrophils Relative %: 59 %
Platelets: 237 10*3/uL (ref 150–400)
RBC: 4.57 MIL/uL (ref 3.87–5.11)
RDW: 14.1 % (ref 11.5–15.5)
WBC: 11 10*3/uL — ABNORMAL HIGH (ref 4.0–10.5)
nRBC: 0 % (ref 0.0–0.2)

## 2020-08-28 LAB — COMPREHENSIVE METABOLIC PANEL
ALT: 30 U/L (ref 0–44)
AST: 36 U/L (ref 15–41)
Albumin: 3.5 g/dL (ref 3.5–5.0)
Alkaline Phosphatase: 42 U/L (ref 38–126)
Anion gap: 12 (ref 5–15)
BUN: 27 mg/dL — ABNORMAL HIGH (ref 8–23)
CO2: 25 mmol/L (ref 22–32)
Calcium: 9 mg/dL (ref 8.9–10.3)
Chloride: 101 mmol/L (ref 98–111)
Creatinine, Ser: 1.21 mg/dL — ABNORMAL HIGH (ref 0.44–1.00)
GFR, Estimated: 46 mL/min — ABNORMAL LOW (ref >60)
Glucose, Bld: 95 mg/dL (ref 70–99)
Potassium: 3.3 mmol/L — ABNORMAL LOW (ref 3.5–5.1)
Sodium: 138 mmol/L (ref 135–145)
Total Bilirubin: 1 mg/dL (ref 0.3–1.2)
Total Protein: 6.9 g/dL (ref 6.5–8.1)

## 2020-08-28 LAB — URINALYSIS, ROUTINE W REFLEX MICROSCOPIC
Bilirubin Urine: NEGATIVE
Glucose, UA: NEGATIVE mg/dL
Hgb urine dipstick: NEGATIVE
Ketones, ur: NEGATIVE mg/dL
Nitrite: NEGATIVE
Protein, ur: NEGATIVE mg/dL
Specific Gravity, Urine: 1.01 (ref 1.005–1.030)
pH: 6.5 (ref 5.0–8.0)

## 2020-08-28 LAB — URINALYSIS, MICROSCOPIC (REFLEX)

## 2020-08-28 MED ORDER — ALLOPURINOL 300 MG PO TABS: ORAL_TABLET | ORAL | 0 refills | Status: DC

## 2020-08-28 MED ORDER — ATENOLOL-CHLORTHALIDONE 50-25 MG PO TABS: ORAL_TABLET | ORAL | 0 refills | Status: DC

## 2020-08-28 MED ORDER — CEPHALEXIN 500 MG PO CAPS: 500.0000 mg | ORAL_CAPSULE | Freq: Two times a day (BID) | ORAL | 0 refills | Status: AC

## 2020-08-28 NOTE — ED Provider Notes (Signed)
[facility] EMERGENCY DEPARTMENT Provider Note   CSN: [id number] Arrival date & time: 08/28/20  1654     History Chief Complaint  Patient presents with  . Fatigue    [name] is a 79 y.o. female.  The history is provided by the patient.  Dysuria Pain quality:  Burning Pain severity:  Mild Onset quality:  Gradual Timing:  Constant Progression:  Unchanged Chronicity:  New Relieved by:  Nothing Worsened by:  Nothing Urinary symptoms: frequent urination   Urinary symptoms: no discolored urine   Associated symptoms: no abdominal pain, no fever, no flank pain, no genital lesions, no nausea, no vaginal discharge and no vomiting   Associated symptoms comment:  Skin irritation in pelvic area       Past Medical History:  Diagnosis Date  . Abdominal pain   . Arthritis    cervical disc degeneration/ oa left knee, carpal tunnel rt wrist, adhesive capsulitis right shoulder, rt hand weakness; lumbar degeneration  . Carpal tunnel syndrome   . 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 Constipation   . Diabetes mellitus without complication ([place])    borderline - diet control  . Diverticulitis hx of  . Diverticulosis   . E. coli infection    2020  . Frequent UTI    hx of urethral injury during colon surgery - states frequent uti's since  . GERD (gastroesophageal reflux disease)   . H/O hiatal hernia   . History of palpitations    in the past  . History of shingles    has a lingering itching on back where shingles were  . Hyperlipidemia   . Hypertension   . Numbness and tingling in right hand    pt. states has numbness of right hand very frequently-watch positioning  . Obesity   . Osteoarthritis   . Osteoporosis   . Pain    pain left knee and pain right hip and right groin  . Personal  history of colonic polyps-adenoma 08/26/2008  . Pneumonia 2005  . Vitamin D deficiency     Patient Active Problem List   Diagnosis Date Noted  . Vitamin D deficiency 06/01/2020  . CAP (community acquired pneumonia) 05/06/2020  . Abdominal bloating 02/17/2019  . Urinary frequency 02/17/2019  . Uncontrolled type 2 diabetes mellitus with hyperglycemia ([place]) 02/17/2019  . Lightheaded 02/17/2019  . Constipation 06/26/2018  . Hyperlipidemia 08/23/2014  . Type 2 diabetes mellitus with stage 3 chronic kidney disease, without long-term current use of insulin ([place]) 06/19/2014  . Mixed hyperlipidemia 06/19/2014  . S/P urological surgery 09/30/2013  . Joint pain 09/30/2013  . UTI (urinary tract infection) 09/30/2013  . Cough 12/10/2012  . OA (osteoarthritis) of knee 01/05/2012  . 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 Vaginal discharge 08/02/2011  . Spasm of lumbar paraspinous muscle 06/17/2011  . Morbidly obese ([place]) 05/31/2011  . HIP PAIN, LEFT, CHRONIC 11/09/2010  . CARPAL TUNNEL SYNDROME, RIGHT 10/14/2010  . KNEE PAIN, RIGHT, CHRONIC 10/14/2010  . LEFT BUNDLE BRANCH BLOCK 07/26/2010  . 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 FATIGUE 07/01/2010  . CHEST PAIN, ATYPICAL 07/01/2010  . EPIGASTRIC PAIN 07/01/2010  . SUPRAPUBIC PAIN 07/01/2010  . HYPERGLYCEMIA 07/01/2010  . CELLULITIS AND ABSCESS OF OTHER SPECIFIED SITE 02/15/2010  . LUMP OR MASS IN BREAST 01/10/2010  .  DYSURIA, CHRONIC 03/09/2009  . GERD 10/06/2008  . Personal history of colonic polyps-adenoma 08/26/2008  . LOC OSTEOARTHROS NOT SPEC PRIM/SEC LOWER LEG 09/10/2007  . GOUT, UNSPECIFIED 09/09/2007  . DIVERTICULOSIS, COLON 09/09/2007  . DIVERTICULITIS, HX OF 09/09/2007  . HYPERLIPIDEMIA 06/03/2007  . Essential hypertension 06/03/2007    Past Surgical History:  Procedure Laterality Date  . ABDOMINAL HYSTERECTOMY    . bladder tack    . BREAST BIOPSY Left   . BREAST SURGERY     breast duct  resection- benign  . CARPAL TUNNEL RELEASE Right 06/04/2014   Procedure: RIGHT CARPAL TUNNEL RELEASE;  Surgeon: Roseanne Kaufman, MD;  Location: [facility];  Service: Orthopedics;  Laterality: Right;  . COLON RESECTION  2008   hx diverticulosis  . COLON SURGERY    . COLONOSCOPY    . colonoscopy [id number]    . JOINT REPLACEMENT    . OOPHORECTOMY    . POLYPECTOMY    . skin graft left arm - traumatic compression injury left upper arm    . temporary ureter stent    . TOTAL KNEE ARTHROPLASTY  01/05/2012   Procedure: TOTAL KNEE ARTHROPLASTY;  Surgeon: [name], MD;  Location: [facility];  Service: Orthopedics;  Laterality: Right;  . TOTAL KNEE ARTHROPLASTY Left 08/17/2014   Procedure: LEFT TOTAL KNEE ARTHROPLASTY;  Surgeon: [name], MD;  Location: [facility];  Service: Orthopedics;  Laterality: Left;  . ureter repair for tyransected left ureter       OB History   No obstetric history on file.     Family History  Problem Relation Age of Onset  . Breast cancer Mother 34  . Hypertension Mother   . Prostate cancer Father   . Hypertension Father   . Dementia Sister   . Lung cancer Brother   . Hypertension Brother   . COPD Brother   . Cancer Maternal Grandmother   . Arthritis Sister   . Hypertension Sister   . Arthritis Sister   . Hypertension Child   . Hypertension Child   . Hypertension Child   . Colon polyps Neg Hx   . Esophageal cancer Neg Hx   . Rectal cancer Neg Hx   . Stomach cancer Neg Hx   . Colon cancer Neg Hx     Social History   Tobacco Use  . Smoking status: Never Smoker  . Smokeless tobacco: Never Used  Vaping Use  . Vaping Use: Never used  Substance Use Topics  . Alcohol use: No  . Drug use: No    Home Medications Prior to Admission medications   Medication Sig Start Date End Date Taking? Authorizing Provider  allopurinol (ZYLOPRIM) 300 MG tablet TAKE 1 TABLET(300 MG) BY MOUTH DAILY 08/28/20   [name], MD    amLODipine-benazepril (LOTREL) 5-10 MG capsule TAKE 1 CAPSULE BY MOUTH EVERY DAY 08/10/20   [name], MD  aspirin EC 81 MG tablet Take 1 tablet (81 mg total) by mouth daily. 06/24/19   [name], MD  atenolol-chlorthalidone (TENORETIC) 50-25 MG tablet Take one tablet by mouth daily. 08/28/20   [name], MD  cephALEXin (KEFLEX) 500 MG capsule Take 1 capsule (500 mg total) by mouth 2 (two) times daily for 5 days. 08/28/20 09/02/20  Lennice Sites, DO  Cholecalciferol (VITAMIN D3) 1.25 MG (50000 UT) CAPS Take 1 capsule by mouth on Tuesday and Friday 06/07/20   [name], MD  Dulaglutide (TRULICITY) 5.40 GQ/6.7YP SOPN INJECT 0.5 ML(0.75 MG) SUBCUTANEOUSLY  EVERY WEEK IN ABDOMEN, THIGH OR UPPER ARM ROTATING INJECTION SITE 07/22/19   [name], MD  Lancets [facility] DELICA PLUS [id number]) MISC USE AS DIRECTED TO CHECK BLOOD SUGAR TWICE DAILY 05/17/20   [name], MD  Magnesium 250 MG TABS Take 1 tablet (250 mg total) by mouth every evening. Take with dinner meal 05/12/20   [name], FNP  Multiple Vitamins-Minerals (ZINC PO) Take 1 tablet by mouth daily.     [provider]  [facility] VERIO test strip USE AS DIRECTED TO TEST BLOOD SUGAR TWICE DAILY 05/12/20   [name], MD  Probiotic Product (PROBIOTIC PO) Take 1 capsule by mouth daily.     [provider]  rosuvastatin (CRESTOR) 20 MG tablet TAKE 1 TABLET(20 MG) BY MOUTH DAILY 08/10/20   [name], MD    Allergies    Atorvastatin calcium, Bee venom, Oxycodone, and Vicodin [hydrocodone-acetaminophen]  Review of Systems   Review of Systems  Constitutional: Negative for chills and fever.  HENT: Negative for ear pain and sore throat.   Eyes: Negative for pain and visual disturbance.  Respiratory: Negative for cough and shortness of breath.   Cardiovascular: Negative for chest pain and palpitations.  Gastrointestinal: Negative for abdominal pain, nausea and vomiting.  Genitourinary: Positive for  dysuria and frequency. Negative for flank pain, hematuria and vaginal discharge.  Musculoskeletal: Negative for arthralgias and back pain.  Skin: Positive for rash. Negative for color change.  Neurological: Negative for seizures and syncope.  All other systems reviewed and are negative.   Physical Exam Updated Vital Signs  ED Triage Vitals  Enc Vitals Group     BP 08/28/20 1727 134/67     Pulse Rate 08/28/20 1727 64     Resp 08/28/20 1727 18     Temp 08/28/20 1727 98.5 F (36.9 C)     Temp Source 08/28/20 1727 Oral     SpO2 08/28/20 1727 99 %     Weight 08/28/20 1729 246 lb (111.6 kg)     Height 08/28/20 1729 5\' 8"  (1.727 m)     Head Circumference --      Peak Flow --      Pain Score 08/28/20 1729 0     Pain Loc --      Pain Edu? --      Excl. in [place]? --     Physical Exam Vitals and nursing note reviewed.  Constitutional:      General: She is not in acute distress.    Appearance: She is well-developed. She is not ill-appearing.  HENT:     Head: Normocephalic and atraumatic.     Nose: Nose normal.     Mouth/Throat:     Mouth: Mucous membranes are moist.  Eyes:     Extraocular Movements: Extraocular movements intact.     Conjunctiva/sclera: Conjunctivae normal.     Pupils: Pupils are equal, round, and reactive to light.  Cardiovascular:     Rate and Rhythm: Normal rate and regular rhythm.     Pulses: Normal pulses.     Heart sounds: Normal heart sounds. No murmur heard.   Pulmonary:     Effort: Pulmonary effort is normal. No respiratory distress.     Breath sounds: Normal breath sounds.  Abdominal:     Palpations: Abdomen is soft.     Tenderness: There is no abdominal tenderness.  Musculoskeletal:     Cervical back: Normal range of motion and neck supple.  Skin:    General: Skin  is warm and dry.     Capillary Refill: Capillary refill takes less than 2 seconds.  Neurological:     General: No focal deficit present.     Mental Status: She is alert.    Psychiatric:        Mood and Affect: Mood normal.     ED Results / Procedures / Treatments   Labs (all labs ordered are listed, but only abnormal results are displayed) Labs Reviewed  CBC WITH DIFFERENTIAL/PLATELET - Abnormal; Notable for the following components:      Result Value   WBC 11.0 (*)    Abs Immature Granulocytes 0.08 (*)    All other components within normal limits  COMPREHENSIVE METABOLIC PANEL - Abnormal; Notable for the following components:   Potassium 3.3 (*)    BUN 27 (*)    Creatinine, Ser 1.21 (*)    GFR, Estimated 46 (*)    All other components within normal limits  URINALYSIS, ROUTINE W REFLEX MICROSCOPIC - Abnormal; Notable for the following components:   APPearance HAZY (*)    Leukocytes,Ua LARGE (*)    All other components within normal limits  URINALYSIS, MICROSCOPIC (REFLEX) - Abnormal; Notable for the following components:   Bacteria, UA FEW (*)    All other components within normal limits  URINE CULTURE    EKG None  Radiology No results found.  Procedures Procedures (including critical care time)  Medications Ordered in ED Medications - No data to display  ED Course  I have reviewed the triage vital signs and the nursing notes.  Pertinent labs & imaging results that were available during my care of the patient were reviewed by me and considered in my medical decision making (see chart for details).    MDM Rules/Calculators/A&P                          [name] is a 79 year old female with history of high blood pressure who presents the ED with urinary symptoms.  Normal vitals.  No fever.  Overall patient appears well.  Having urinary tract symptoms.  States some mild irritation in her pelvic area.  Will check basic labs, urinalysis.  Denies any chest pain, shortness of breath, abdominal pain.  External pelvic exam is overall unremarkable.  No concerning cellulitis.  Suspect may be some vaginal atrophy and will have her  follow-up with [facility] about any treatment options.  Urinalysis appears infectious given her symptoms.  Will treat with Keflex.  Otherwise lab work was unremarkable.  Discharged in ED in good condition.  This chart was dictated using voice recognition software.  Despite best efforts to proofread,  errors can occur which can change the documentation meaning.   Final Clinical Impression(s) / ED Diagnoses Final diagnoses:  Acute cystitis without hematuria    Rx / DC Orders ED Discharge Orders         Ordered    cephALEXin (KEFLEX) 500 MG capsule  2 times daily        08/28/20 1945           Lennice Sites, DO 08/28/20 1946

## 2020-08-28 NOTE — Discharge Instructions (Addendum)
Suspect that you have a urinary tract infection.  Please follow-up with your primary care doctor or women's health about pelvic irritation.

## 2020-08-28 NOTE — ED Triage Notes (Signed)
Pt reports feeling weak and fatigued. She was hospitalized with sepsis this summer and states she feels the same

## 2020-08-28 NOTE — ED Notes (Signed)
Pt attempting urine sample 

## 2020-08-28 NOTE — ED Notes (Signed)
Unable to obtain IV access with blood draw

## 2020-08-28 NOTE — ED Notes (Signed)
AVS provided to client along with ABX Rx by ED Provider, reviewed Rx with pt and encourage client to begin Abx tonight, and also provided information on when the need would arise for her to return to the ED. Opportunity for questions provided. Pt DC to home with husband

## 2020-08-30 ENCOUNTER — Other Ambulatory Visit: Payer: Self-pay

## 2020-08-30 LAB — URINE CULTURE

## 2020-08-30 MED ORDER — ATENOLOL-CHLORTHALIDONE 50-25 MG PO TABS: ORAL_TABLET | ORAL | 0 refills | Status: DC

## 2020-08-30 MED ORDER — ALLOPURINOL 300 MG PO TABS: ORAL_TABLET | ORAL | 0 refills | Status: DC

## 2020-09-01 ENCOUNTER — Ambulatory Visit (INDEPENDENT_AMBULATORY_CARE_PROVIDER_SITE_OTHER): Payer: Medicare Other | Admitting: Internal Medicine

## 2020-09-01 ENCOUNTER — Encounter: Payer: Self-pay | Admitting: Internal Medicine

## 2020-09-01 ENCOUNTER — Other Ambulatory Visit: Payer: Self-pay

## 2020-09-01 VITALS — BP 130/74 | HR 95 | Temp 97.6°F | Ht 68.0 in | Wt 240.6 lb

## 2020-09-01 DIAGNOSIS — Z23 Encounter for immunization: Secondary | ICD-10-CM

## 2020-09-01 DIAGNOSIS — L299 Pruritus, unspecified: Secondary | ICD-10-CM | POA: Diagnosis not present

## 2020-09-01 DIAGNOSIS — N183 Chronic kidney disease, stage 3 unspecified: Secondary | ICD-10-CM

## 2020-09-01 DIAGNOSIS — N390 Urinary tract infection, site not specified: Secondary | ICD-10-CM

## 2020-09-01 DIAGNOSIS — E1122 Type 2 diabetes mellitus with diabetic chronic kidney disease: Secondary | ICD-10-CM | POA: Diagnosis not present

## 2020-09-01 DIAGNOSIS — N3941 Urge incontinence: Secondary | ICD-10-CM

## 2020-09-01 DIAGNOSIS — I129 Hypertensive chronic kidney disease with stage 1 through stage 4 chronic kidney disease, or unspecified chronic kidney disease: Secondary | ICD-10-CM | POA: Diagnosis not present

## 2020-09-01 DIAGNOSIS — Z6836 Body mass index (BMI) 36.0-36.9, adult: Secondary | ICD-10-CM

## 2020-09-01 NOTE — Patient Instructions (Addendum)
List of dentists  Dr. Lavella Lemons Redd Dr. Iantha Fallen Dr. Sallee Lange   Urinary Tract Infection, Adult A urinary tract infection (UTI) is an infection of any part of the urinary tract. The urinary tract includes:  The kidneys.  The ureters.  The bladder.  The urethra. These organs make, store, and get rid of pee (urine) in the body. What are the causes? This is caused by germs (bacteria) in your genital area. These germs grow and cause swelling (inflammation) of your urinary tract. What increases the risk? You are more likely to develop this condition if:  You have a small, thin tube (catheter) to drain pee.  You cannot control when you pee or poop (incontinence).  You are female, and: ? You use these methods to prevent pregnancy:  A medicine that kills sperm (spermicide).  A device that blocks sperm (diaphragm). ? You have low levels of a female hormone (estrogen). ? You are pregnant.  You have genes that add to your risk.  You are sexually active.  You take antibiotic medicines.  You have trouble peeing because of: ? A prostate that is bigger than normal, if you are female. ? A blockage in the part of your body that drains pee from the bladder (urethra). ? A kidney stone. ? A nerve condition that affects your bladder (neurogenic bladder). ? Not getting enough to drink. ? Not peeing often enough.  You have other conditions, such as: ? Diabetes. ? A weak disease-fighting system (immune system). ? Sickle cell disease. ? Gout. ? Injury of the spine. What are the signs or symptoms? Symptoms of this condition include:  Needing to pee right away (urgently).  Peeing often.  Peeing small amounts often.  Pain or burning when peeing.  Blood in the pee.  Pee that smells bad or not like normal.  Trouble peeing.  Pee that is cloudy.  Fluid coming from the vagina, if you are female.  Pain in the belly or lower back. Other symptoms  include:  Throwing up (vomiting).  No urge to eat.  Feeling mixed up (confused).  Being tired and grouchy (irritable).  A fever.  Watery poop (diarrhea). How is this treated? This condition may be treated with:  Antibiotic medicine.  Other medicines.  Drinking enough water. Follow these instructions at home:  Medicines  Take over-the-counter and prescription medicines only as told by your doctor.  If you were prescribed an antibiotic medicine, take it as told by your doctor. Do not stop taking it even if you start to feel better. General instructions  Make sure you: ? Pee until your bladder is empty. ? Do not hold pee for a long time. ? Empty your bladder after sex. ? Wipe from front to back after pooping if you are a female. Use each tissue one time when you wipe.  Drink enough fluid to keep your pee pale yellow.  Keep all follow-up visits as told by your doctor. This is important. Contact a doctor if:  You do not get better after 1-2 days.  Your symptoms go away and then come back. Get help right away if:  You have very bad back pain.  You have very bad pain in your lower belly.  You have a fever.  You are sick to your stomach (nauseous).  You are throwing up. Summary  A urinary tract infection (UTI) is an infection of any part of the urinary tract.  This condition is caused by germs in your genital area.  There are many risk factors for a UTI. These include having a small, thin tube to drain pee and not being able to control when you pee or poop.  Treatment includes antibiotic medicines for germs.  Drink enough fluid to keep your pee pale yellow. This information is not intended to replace advice given to you by your health care provider. Make sure you discuss any questions you have with your health care provider. Document Revised: 10/03/2018 Document Reviewed: 04/25/2018 Elsevier Patient Education  2020 [employer].

## 2020-09-01 NOTE — Progress Notes (Signed)
I,Katawbba Wiggins,acting as a Education administrator for Maximino Greenland, MD.,have documented all relevant documentation on the behalf of Maximino Greenland, MD,as directed by  Maximino Greenland, MD while in the presence of Maximino Greenland, MD.  This visit occurred during the SARS-CoV-2 public health emergency.  Safety protocols were in place, including screening questions prior to the visit, additional usage of staff PPE, and extensive cleaning of exam room while observing appropriate contact time as indicated for disinfecting solutions.  Subjective:     Patient ID: [name] , female    DOB: [birth year] , 79 y.o.   MRN: [id number]   Chief Complaint  Patient presents with  . Diabetes  . Hypertension    HPI  The patient is here for a diabetes follow-up.  She reports compliance with meds. Admits she is not exercising much.   Diabetes She presents for her follow-up diabetic visit. She has type 2 diabetes mellitus. There are no hypoglycemic associated symptoms. Pertinent negatives for diabetes include no blurred vision and no chest pain. There are no hypoglycemic complications. Risk factors for coronary artery disease include diabetes mellitus, dyslipidemia, hypertension, obesity, sedentary lifestyle and post-menopausal. She is compliant with treatment most of the time. She participates in exercise intermittently. An ACE inhibitor/angiotensin II receptor blocker is being taken. Eye exam is current.  Hypertension This is a chronic problem. The current episode started more than 1 year ago. The problem has been gradually improving since onset. The problem is controlled. Pertinent negatives include no blurred vision or chest pain. Risk factors for coronary artery disease include diabetes mellitus, dyslipidemia, obesity, post-menopausal state and sedentary lifestyle.     Past Medical History:  Diagnosis Date  . Abdominal pain   . Arthritis    cervical disc degeneration/ oa left knee, carpal tunnel rt wrist,  adhesive capsulitis right shoulder, rt hand weakness; lumbar degeneration  . Carpal tunnel syndrome   . 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 Constipation   . Diabetes mellitus without complication ([place])    borderline - diet control  . Diverticulitis hx of  . Diverticulosis   . E. coli infection    2020  . Frequent UTI    hx of urethral injury during colon surgery - states frequent uti's since  . GERD (gastroesophageal reflux disease)   . H/O hiatal hernia   . History of palpitations    in the past  . History of shingles    has a lingering itching on back where shingles were  . Hyperlipidemia   . Hypertension   . Numbness and tingling in right hand    pt. states has numbness of right hand very frequently-watch positioning  . Obesity   . Osteoarthritis   . Osteoporosis   . Pain    pain left knee and pain right hip and right groin  . Personal history of colonic polyps-adenoma 08/26/2008  . Pneumonia 2005  . Vitamin D deficiency      Family History  Problem Relation Age of Onset  . Breast cancer Mother 72  . Hypertension Mother   . Prostate cancer Father   . Hypertension Father   . Dementia Sister   . Lung cancer Brother   . Hypertension Brother   . COPD Brother   . Cancer Maternal Grandmother   . Arthritis  Sister   . Hypertension Sister   . Arthritis Sister   . Hypertension Child   . Hypertension Child   . Hypertension Child   . Colon polyps Neg Hx   . Esophageal cancer Neg Hx   . Rectal cancer Neg Hx   . Stomach cancer Neg Hx   . Colon cancer Neg Hx      Current Outpatient Medications:  .  allopurinol (ZYLOPRIM) 300 MG tablet, TAKE 1 TABLET(300 MG) BY MOUTH DAILY, Disp: 90 tablet, Rfl: 0 .  amLODipine-benazepril (LOTREL) 5-10 MG capsule, TAKE 1 CAPSULE BY MOUTH EVERY DAY, Disp: 90  capsule, Rfl: 0 .  aspirin EC 81 MG tablet, Take 1 tablet (81 mg total) by mouth daily., Disp: 90 tablet, Rfl: 0 .  atenolol-chlorthalidone (TENORETIC) 50-25 MG tablet, Take one tablet by mouth daily., Disp: 90 tablet, Rfl: 0 .  Cholecalciferol (VITAMIN D3) 1.25 MG (50000 UT) CAPS, Take 1 capsule by mouth on Tuesday and Friday, Disp: 24 capsule, Rfl: 1 .  Dulaglutide (TRULICITY) 6.01 UX/3.2TF SOPN, INJECT 0.5 ML(0.75 MG) SUBCUTANEOUSLY EVERY WEEK IN ABDOMEN, THIGH OR UPPER ARM ROTATING INJECTION SITE, Disp: 2 mL, Rfl: 0 .  Lancets (ONETOUCH DELICA PLUS [id number]) MISC, USE AS DIRECTED TO CHECK BLOOD SUGAR TWICE DAILY, Disp: 100 each, Rfl: 3 .  Multiple Vitamins-Minerals (ZINC PO), Take 1 tablet by mouth daily. , Disp: , Rfl:  .  ONETOUCH VERIO test strip, USE AS DIRECTED TO TEST BLOOD SUGAR TWICE DAILY, Disp: 100 strip, Rfl: 3 .  Probiotic Product (PROBIOTIC PO), Take 1 capsule by mouth daily. , Disp: , Rfl:  .  rosuvastatin (CRESTOR) 20 MG tablet, TAKE 1 TABLET(20 MG) BY MOUTH DAILY, Disp: 90 tablet, Rfl: 0 .  Magnesium 250 MG TABS, Take 1 tablet (250 mg total) by mouth every evening. Take with dinner meal (Patient not taking: Reported on 09/01/2020), Disp: 90 tablet, Rfl: 1   Allergies  Allergen Reactions  . Atorvastatin Calcium Itching  . Bee Venom Hives  . Oxycodone Other (See Comments)    hallucinations  . Vicodin [Hydrocodone-Acetaminophen] Other (See Comments)    hallucinations     Review of Systems  Constitutional: Negative.   Eyes: Negative for blurred vision.  Respiratory: Negative.   Cardiovascular: Negative.  Negative for chest pain.  Gastrointestinal: Negative.   Genitourinary: Positive for frequency and urgency.  Psychiatric/Behavioral: Negative.   All other systems reviewed and are negative.    Today's Vitals   09/01/20 1413  BP: 130/74  Pulse: 95  Temp: 97.6 F (36.4 C)  TempSrc: Oral  Weight: 240 lb 9.6 oz (109.1 kg)  Height: 5\' 8"  (1.727 m)  PainSc: 0-No  pain   Body mass index is 36.58 kg/m.  Wt Readings from Last 3 Encounters:  09/01/20 240 lb 9.6 oz (109.1 kg)  08/28/20 246 lb (111.6 kg)  06/03/20 242 lb (109.8 kg)   Objective:  Physical Exam Vitals and nursing note reviewed.  Constitutional:      Appearance: Normal appearance. She is obese.  HENT:     Head: Normocephalic and atraumatic.  Cardiovascular:     Rate and Rhythm: Normal rate and regular rhythm.     Heart sounds: Normal heart sounds.  Pulmonary:     Breath sounds: Normal breath sounds.  Skin:    General: Skin is warm.  Neurological:     General: No focal deficit present.     Mental Status: She is alert and oriented to person, place, and time.  Assessment And Plan:     1. Type 2 diabetes mellitus with stage 3 chronic kidney disease, without long-term current use of insulin, unspecified whether stage 3a or 3b CKD ([place]) Comments: Chronic, I will check an a1c today. Renal function was evaluated on 10/30.  - Hemoglobin A1c  2. Hypertensive nephropathy Comments: Chronic, fair control. She will continue with current meds. She is encouraged to avoid adding salt to her foods.   3. Urge incontinence of urine Comments: Will defer treatment until she has completed full course of abx.  She declined vaginal exam.  - Ambulatory referral to Urology  4. Recurrent UTI Comments: I will refer her to Urology as requested.   5. Pruritus Comments: I will check food allergy panel. Liver function from 10/30 is within normal limits. Encouraged to cut back on sugar intake. Sx could be due to fatty liver.  - Allergens(96) Foods  6. Class 2 severe obesity due to excess calories with serious comorbidity and body mass index (BMI) of 36.0 to 36.9 in adult [facility]) Comments: She is encouraged to strive for BMI less than 30 to decrease cardiac risk. Advised to aim for at least 150 minutes of exercise per week.   7. Need for vaccination Comments: She was given high dose flu  vaccine.  - Flu Vaccine QUAD High Dose(Fluad)   Patient was given opportunity to ask questions. Patient verbalized understanding of the plan and was able to repeat key elements of the plan. All questions were answered to their satisfaction.  Maximino Greenland, MD   I, Maximino Greenland, MD, have reviewed all documentation for this visit. The documentation on 09/13/20 for the exam, diagnosis, procedures, and orders are all accurate and complete.  THE PATIENT IS ENCOURAGED TO PRACTICE SOCIAL DISTANCING DUE TO THE COVID-19 PANDEMIC.

## 2020-09-02 ENCOUNTER — Telehealth: Payer: Medicare Other

## 2020-09-02 ENCOUNTER — Telehealth: Payer: Self-pay

## 2020-09-02 NOTE — Telephone Encounter (Signed)
  Chronic Care Management   Outreach Note  09/02/2020 Name: [name] MRN: [id number] DOB: 20-Mar-1941  Referred by: [name], MD Reason for referral : Care Coordination   An unsuccessful telephone outreach was attempted today. The patient was referred to the case management team for assistance with care management and care coordination. Message left with patients daughter-in-law requesting the patient return this SW call.  Follow Up Plan: The care management team will reach out to the patient again over the next 21 days.   [name], BSW, CDP Social Worker, Certified Dementia Practitioner [place] / [place] [facility] [phone]

## 2020-09-03 ENCOUNTER — Telehealth: Payer: Self-pay

## 2020-09-03 NOTE — Telephone Encounter (Cosign Needed)
  Chronic Care Management   Outreach Note  09/03/2020 Name: [name] MRN: [id number] DOB: 04-21-1941  Referred by: [name], MD Reason for referral : Chronic Care Management (CCM RNCM FU Call )   An unsuccessful telephone outreach was attempted today. The patient was referred to the case management team for assistance with care management and care coordination.   Follow Up Plan: A HIPAA compliant phone message was left for the patient providing contact information and requesting a return call.  Telephone follow up appointment with care management team member scheduled for: 10/19/20  Barb Merino, RN, BSN, CCM Care Management Coordinator [place] [facility]  Direct Phone: [phone]

## 2020-09-04 LAB — ALLERGENS(96) FOODS
Allergen Apple, IgE: <0.1 kU/L
Allergen Banana IgE: <0.1 kU/L
Allergen Barley IgE: <0.1 kU/L
Allergen Black Pepper IgE: <0.1 kU/L
Allergen Blueberry IgE: <0.1 kU/L
Allergen Broccoli: <0.1 kU/L
Allergen Cabbage IgE: <0.1 kU/L
Allergen Carrot IgE: <0.1 kU/L
Allergen Cauliflower IgE: <0.1 kU/L
Allergen Celery IgE: <0.1 kU/L
Allergen Cinnamon IgE: <0.1 kU/L
Allergen Coconut IgE: <0.1 kU/L
Allergen Corn, IgE: <0.1 kU/L
Allergen Cucumber IgE: <0.1 kU/L
Allergen Garlic IgE: <0.1 kU/L
Allergen Ginger IgE: <0.1 kU/L
Allergen Gluten IgE: <0.1 kU/L
Allergen Grape IgE: <0.1 kU/L
Allergen Grapefruit IgE: <0.1 kU/L
Allergen Green Bean IgE: <0.1 kU/L
Allergen Green Bell Pepper IgE: <0.1 kU/L
Allergen Green Pea IgE: <0.1 kU/L
Allergen Lamb IgE: <0.1 kU/L
Allergen Lettuce IgE: <0.1 kU/L
Allergen Lime IgE: <0.1 kU/L
Allergen Melon IgE: <0.1 kU/L
Allergen Oat IgE: <0.1 kU/L
Allergen Onion IgE: <0.1 kU/L
Allergen Pear IgE: <0.1 kU/L
Allergen Potato, White IgE: <0.1 kU/L
Allergen Rice IgE: <0.1 kU/L
Allergen Salmon IgE: <0.1 kU/L
Allergen Strawberry IgE: <0.1 kU/L
Allergen Sweet Potato IgE: <0.1 kU/L
Allergen Tomato, IgE: <0.1 kU/L
Allergen Turkey IgE: <0.1 kU/L
Allergen Watermelon IgE: <0.1 kU/L
Allergen, Peach f95: <0.1 kU/L
Basil: <0.1 kU/L
Beef IgE: <0.1 kU/L
C074-IgE Gelatin: <0.1 kU/L
Chicken IgE: <0.1 kU/L
Chocolate/Cacao IgE: <0.1 kU/L
Clam IgE: <0.1 kU/L
Codfish IgE: <0.1 kU/L
Coffee: <0.1 kU/L
Cranberry IgE: <0.1 kU/L
Egg White IgE: <0.1 kU/L
F020-IgE Almond: <0.1 kU/L
F023-IgE Crab: <0.1 kU/L
F045-IgE Yeast: <0.1 kU/L
F076-IgE Alpha Lactalbumin: <0.1 kU/L
F077-IgE Beta Lactoglobulin: <0.1 kU/L
F078-IgE Casein: <0.1 kU/L
F080-IgE Lobster: <0.1 kU/L
F081-IgE Cheese, Cheddar Type: <0.1 kU/L
F089-IgE Mustard: <0.1 kU/L
F096-IgE Avocado: <0.1 kU/L
F202-IgE Cashew Nut: <0.1 kU/L
F214-IgE Spinach: <0.1 kU/L
F222-IgE Tea: <0.1 kU/L
F242-IgE Bing Cherry: <0.1 kU/L
F247-IgE Honey: <0.1 kU/L
F261-IgE Asparagus: <0.1 kU/L
F262-IgE Eggplant: <0.1 kU/L
F265-IgE Cumin: <0.1 kU/L
F278-IgE Bayleaf (Laurel): <0.1 kU/L
F279-IgE Chili Pepper: <0.1 kU/L
F283-IgE Oregano: <0.1 kU/L
F300-IgE Goat's Milk: <0.1 kU/L
F342-IgE Olive, Black: <0.1 kU/L
F343-IgE Raspberry: <0.1 kU/L
Hops: <0.1 kU/L
IgE Egg (Yolk): <0.1 kU/L
Kidney Bean IgE: <0.1 kU/L
Lemon: <0.1 kU/L
Lima Bean IgE: <0.1 kU/L
Malt: <0.1 kU/L
Mushroom IgE: <0.1 kU/L
Orange: <0.1 kU/L
Peanut IgE: <0.1 kU/L
Pineapple IgE: <0.1 kU/L
Pork IgE: <0.1 kU/L
Pumpkin IgE: <0.1 kU/L
Red Beet: <0.1 kU/L
Rye IgE: <0.1 kU/L
Scallop IgE: <0.1 kU/L
Sesame Seed IgE: <0.1 kU/L
Shrimp IgE: <0.1 kU/L
Soybean IgE: <0.1 kU/L
Tuna: <0.1 kU/L
Vanilla: <0.1 kU/L
Walnut IgE: <0.1 kU/L
Wheat IgE: <0.1 kU/L
Whey: <0.1 kU/L
White Bean IgE: <0.1 kU/L

## 2020-09-04 LAB — HEMOGLOBIN A1C
Est. average glucose Bld gHb Est-mCnc: 134 mg/dL
Hgb A1c MFr Bld: 6.3 % — ABNORMAL HIGH (ref 4.8–5.6)

## 2020-09-10 DIAGNOSIS — M1000 Idiopathic gout, unspecified site: Secondary | ICD-10-CM | POA: Diagnosis not present

## 2020-09-10 DIAGNOSIS — N390 Urinary tract infection, site not specified: Secondary | ICD-10-CM | POA: Diagnosis not present

## 2020-09-10 DIAGNOSIS — I129 Hypertensive chronic kidney disease with stage 1 through stage 4 chronic kidney disease, or unspecified chronic kidney disease: Secondary | ICD-10-CM | POA: Diagnosis not present

## 2020-09-10 DIAGNOSIS — E1122 Type 2 diabetes mellitus with diabetic chronic kidney disease: Secondary | ICD-10-CM | POA: Diagnosis not present

## 2020-09-10 DIAGNOSIS — N1831 Chronic kidney disease, stage 3a: Secondary | ICD-10-CM | POA: Diagnosis not present

## 2020-09-15 ENCOUNTER — Ambulatory Visit: Payer: Self-pay | Admitting: Internal Medicine

## 2020-09-16 ENCOUNTER — Ambulatory Visit (INDEPENDENT_AMBULATORY_CARE_PROVIDER_SITE_OTHER): Payer: Medicare Other | Admitting: Internal Medicine

## 2020-09-16 ENCOUNTER — Encounter: Payer: Self-pay | Admitting: Internal Medicine

## 2020-09-16 ENCOUNTER — Other Ambulatory Visit: Payer: Self-pay

## 2020-09-16 VITALS — Temp 98.2°F | Ht 65.0 in | Wt 243.6 lb

## 2020-09-16 DIAGNOSIS — I700 Atherosclerosis of aorta: Secondary | ICD-10-CM

## 2020-09-16 DIAGNOSIS — R42 Dizziness and giddiness: Secondary | ICD-10-CM

## 2020-09-16 DIAGNOSIS — N183 Chronic kidney disease, stage 3 unspecified: Secondary | ICD-10-CM

## 2020-09-16 DIAGNOSIS — E1122 Type 2 diabetes mellitus with diabetic chronic kidney disease: Secondary | ICD-10-CM | POA: Diagnosis not present

## 2020-09-16 DIAGNOSIS — N390 Urinary tract infection, site not specified: Secondary | ICD-10-CM | POA: Diagnosis not present

## 2020-09-16 LAB — POCT URINALYSIS DIPSTICK
Bilirubin, UA: NEGATIVE
Blood, UA: NEGATIVE
Glucose, UA: NEGATIVE
Ketones, UA: NEGATIVE
Leukocytes, UA: NEGATIVE
Nitrite, UA: NEGATIVE
Protein, UA: NEGATIVE
Spec Grav, UA: 1.02 (ref 1.010–1.025)
Urobilinogen, UA: 0.2 E.U./dL
pH, UA: 6 (ref 5.0–8.0)

## 2020-09-16 NOTE — Progress Notes (Signed)
I,Tianna Badgett,acting as a Education administrator for Maximino Greenland, MD.,have documented all relevant documentation on the behalf of Maximino Greenland, MD,as directed by  Maximino Greenland, MD while in the presence of Maximino Greenland, MD.  This visit occurred during the SARS-CoV-2 public health emergency.  Safety protocols were in place, including screening questions prior to the visit, additional usage of staff PPE, and extensive cleaning of exam room while observing appropriate contact time as indicated for disinfecting solutions.  Subjective:     Patient ID: [name] , female    DOB: 1941/08/18 , 79 y.o.   MRN: [id number]   Chief Complaint  Patient presents with  . Urinary Tract Infection    HPI  She is here today for f/u recent UTI.  She wants to make sure it has resolved.  She admits that she is no longer having sx. However, since she gets them frequently, she wants to make sure this has resolved. Denies seeing blood in her urine. Denies vaginal discharge.   Also c/o dizziness.  She reports her sx have lessened greatly since her kidney specialist has taken her off of some meds. She can't recall which medication was stopped.   Dizziness This is a recurrent problem. The current episode started more than 1 month ago. The problem occurs intermittently. The problem has been gradually improving.     Past Medical History:  Diagnosis Date  . Abdominal pain   . Arthritis    cervical disc degeneration/ oa left knee, carpal tunnel rt wrist, adhesive capsulitis right shoulder, rt hand weakness; lumbar degeneration  . Carpal tunnel syndrome   . 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 Constipation   . Diabetes mellitus without complication ([place])    borderline - diet control  . Diverticulitis hx of  . Diverticulosis   . E. coli  infection    2020  . Frequent UTI    hx of urethral injury during colon surgery - states frequent uti's since  . GERD (gastroesophageal reflux disease)   . H/O hiatal hernia   . History of palpitations    in the past  . History of shingles    has a lingering itching on back where shingles were  . Hyperlipidemia   . Hypertension   . Numbness and tingling in right hand    pt. states has numbness of right hand very frequently-watch positioning  . Obesity   . Osteoarthritis   . Osteoporosis   . Pain    pain left knee and pain right hip and right groin  . Personal history of colonic polyps-adenoma 08/26/2008  . Pneumonia 2005  . Vitamin D deficiency      Family History  Problem Relation Age of Onset  . Breast cancer Mother 37  . Hypertension Mother   . Prostate cancer Father   . Hypertension Father   . Dementia Sister   . Lung cancer Brother   . Hypertension Brother   . COPD Brother   . Cancer Maternal Grandmother   . Arthritis Sister   . Hypertension Sister   . Arthritis Sister   . Hypertension Child   . Hypertension Child   . Hypertension Child   . Colon polyps Neg Hx   . Esophageal cancer Neg Hx   . Rectal cancer Neg Hx   .  Stomach cancer Neg Hx   . Colon cancer Neg Hx      Current Outpatient Medications:  .  allopurinol (ZYLOPRIM) 300 MG tablet, TAKE 1 TABLET(300 MG) BY MOUTH DAILY, Disp: 90 tablet, Rfl: 0 .  aspirin EC 81 MG tablet, Take 1 tablet (81 mg total) by mouth daily., Disp: 90 tablet, Rfl: 0 .  atenolol-chlorthalidone (TENORETIC) 50-25 MG tablet, Take one tablet by mouth daily., Disp: 90 tablet, Rfl: 0 .  Cholecalciferol (VITAMIN D3) 1.25 MG (50000 UT) CAPS, Take 1 capsule by mouth on Tuesday and Friday, Disp: 24 capsule, Rfl: 1 .  Dulaglutide (TRULICITY) 4.65 KC/1.2XN SOPN, INJECT 0.5 ML(0.75 MG) SUBCUTANEOUSLY EVERY WEEK IN ABDOMEN, THIGH OR UPPER ARM ROTATING INJECTION SITE, Disp: 2 mL, Rfl: 0 .  Lancets (ONETOUCH DELICA PLUS [id number]) MISC, USE AS  DIRECTED TO CHECK BLOOD SUGAR TWICE DAILY, Disp: 100 each, Rfl: 3 .  Magnesium 250 MG TABS, Take 1 tablet (250 mg total) by mouth every evening. Take with dinner meal, Disp: 90 tablet, Rfl: 1 .  Multiple Vitamins-Minerals (ZINC PO), Take 1 tablet by mouth daily. , Disp: , Rfl:  .  ONETOUCH VERIO test strip, USE AS DIRECTED TO TEST BLOOD SUGAR TWICE DAILY, Disp: 100 strip, Rfl: 3 .  Probiotic Product (PROBIOTIC PO), Take 1 capsule by mouth daily. , Disp: , Rfl:  .  amLODipine-benazepril (LOTREL) 5-10 MG capsule, TAKE 1 CAPSULE BY MOUTH EVERY DAY, Disp: 90 capsule, Rfl: 0 .  rosuvastatin (CRESTOR) 20 MG tablet, TAKE 1 TABLET(20 MG) BY MOUTH DAILY, Disp: 90 tablet, Rfl: 0   Allergies  Allergen Reactions  . Atorvastatin Calcium Itching  . Bee Venom Hives  . Oxycodone Other (See Comments)    hallucinations  . Vicodin [Hydrocodone-Acetaminophen] Other (See Comments)    hallucinations     Review of Systems  Constitutional: Negative.   Respiratory: Negative.   Cardiovascular: Negative.   Gastrointestinal: Negative.   Neurological: Positive for dizziness and light-headedness.     Today's Vitals   09/16/20 1131  Temp: 98.2 F (36.8 C)  TempSrc: Oral  Weight: 243 lb 9.6 oz (110.5 kg)  Height: 5\' 5"  (1.651 m)   Body mass index is 40.54 kg/m.   Objective:  Physical Exam Vitals and nursing note reviewed.  Constitutional:      Appearance: Normal appearance. She is obese.  HENT:     Head: Normocephalic and atraumatic.     Right Ear: Tympanic membrane, ear canal and external ear normal.     Left Ear: Tympanic membrane, ear canal and external ear normal.  Cardiovascular:     Rate and Rhythm: Normal rate and regular rhythm.     Heart sounds: Normal heart sounds.  Pulmonary:     Effort: Pulmonary effort is normal.     Breath sounds: Normal breath sounds.  Skin:    General: Skin is warm.  Neurological:     General: No focal deficit present.     Mental Status: She is alert.   Psychiatric:        Mood and Affect: Mood normal.        Behavior: Behavior normal.         Assessment And Plan:     1. Recurrent UTI Comments: She wants to check urine to make sure UTI has resolved. I will check urinalysis. - POCT Urinalysis Dipstick (81002)  2. Dizziness Comments: Intermittent, sx are resolving since she has been taken off of some antihypertensives. She is encouraged to stay well hydrated.  3. Aortic atherosclerosis (H[facility]) Comments: Chronic. Importance of living heart healthy lifestyle and statin compliance wa sdiscussed with the patient.   4. Type 2 diabetes mellitus with stage 3 chronic kidney disease, without long-term current use of insulin, unspecified whether stage 3a or 3b CKD ([place]) Comments: Chronic, encouraged to check BS daily. She was congratulated on her efforts to getting this under control.      Patient was given opportunity to ask questions. Patient verbalized understanding of the plan and was able to repeat key elements of the plan. All questions were answered to their satisfaction.  Maximino Greenland, MD   I, Maximino Greenland, MD, have reviewed all documentation for this visit. The documentation on 10/09/20 for the exam, diagnosis, procedures, and orders are all accurate and complete.  THE PATIENT IS ENCOURAGED TO PRACTICE SOCIAL DISTANCING DUE TO THE COVID-19 PANDEMIC.

## 2020-09-16 NOTE — Patient Instructions (Signed)

## 2020-09-17 ENCOUNTER — Telehealth: Payer: Self-pay

## 2020-09-17 NOTE — Telephone Encounter (Cosign Needed)
  Chronic Care Management   Outreach Note  09/17/2020 Name: [name] MRN: [id number] DOB: [birth year]  Referred by: [name], MD Reason for referral : Chronic Care Management (Inbound Call from patient )   Received voice message from [name] requesting financial resources to assist with the cost of dental care and female depends for urinary incontinence. The patient was referred to the case management team for assistance with care management and care coordination. Sent in basket message to embedded BSW [name] requesting she f/u with patient to assist.   Follow Up Plan: Telephone follow up appointment with care management team member scheduled for: 09/28/20  Barb Merino, RN, BSN, CCM Care Management Coordinator [place] [facility]  Direct Phone: [phone]

## 2020-09-20 ENCOUNTER — Ambulatory Visit: Payer: Self-pay | Admitting: Urology

## 2020-09-20 ENCOUNTER — Encounter: Payer: Self-pay | Admitting: Urology

## 2020-09-27 ENCOUNTER — Telehealth: Payer: Self-pay | Admitting: *Deleted

## 2020-09-27 NOTE — Chronic Care Management (AMB) (Signed)
  Care Management   Note  09/27/2020 Name: [name] MRN: [id number] DOB: 11-19-1940  [name] is a 79 y.o. year old female who is a primary care patient of [name], MD and is actively engaged with the care management team. I reached out to [name] by phone today to assist with re-scheduling an initial visit with the Pharmacist  Follow up plan: Unsuccessful telephone outreach attempt made. A HIPAA compliant phone message was left for the patient providing contact information and requesting a return call. The care management team will reach out to the patient again over the next 7 days. If patient returns call to provider office, please advise to call [facility] at [phone].  [place] [facility]

## 2020-09-28 ENCOUNTER — Telehealth: Payer: Self-pay

## 2020-09-28 ENCOUNTER — Telehealth: Payer: Medicare Other

## 2020-09-28 NOTE — Telephone Encounter (Signed)
  Chronic Care Management   Outreach Note  09/28/2020 Name: [name] MRN: [id number] DOB: Jun 05, 1941  Referred by: [name], MD Reason for referral : Care Coordination   An unsuccessful telephone outreach was attempted today. The patient was referred to the case management team for assistance with care management and care coordination.   Follow Up Plan: A HIPAA compliant phone message was left for the patient providing contact information and requesting a return call.  The care management team will reach out to the patient again over the next 21 days.   [name], BSW, CDP Social Worker, Certified Dementia Practitioner [place] / [place] [facility] [phone]

## 2020-09-28 NOTE — Chronic Care Management (AMB) (Signed)
  Care Management   Note  09/28/2020 Name: Lakasha Mcfall MRN: [id number] DOB: 27-Apr-1941  Kailly Richoux is a 79 y.o. year old female who is a primary care patient of [name], MD and is actively engaged with the care management team. I reached out to [name] by phone today to assist with re-scheduling an initial visit with the Pharmacist.  Follow up plan: Telephone appointment with care management team member scheduled for: 11/05/2019  [place] [facility]

## 2020-09-29 ENCOUNTER — Telehealth: Payer: Self-pay

## 2020-09-29 NOTE — Chronic Care Management (AMB) (Signed)
Chronic Care Management Pharmacy Assistant    Name: [name]  MRN: [id number] DOB: 09-16-1941    Reason for Encounter: Medication Review   Have you seen any other providers since your last visit? Yes  09/16/2020 [facility] UTI   Any changes in your medications or health? Yes,  Patient states [facility] , states he stopped her Amlodipine/ also she states she is only taking her Tulicity every other week .   Any side effects from any medications? No   Do you have an symptoms or problems not managed by your medications? No . Patient states she has been feeling better since medication changes.  Any concerns about your health right now? No.  Has your provider asked that you check blood pressure, blood sugar, or follow special diet at home? Yes, Patient takes her blood sugar and blood pressures once a day . Blood sugar this morning fasting was 119.  Do you get any type of exercise on a regular basis? Patient states she is active, and she has been eating healthy , states she eats a lot of vegetables.   Can you think of a goal you would like to reach for your health? No   Do you have any problems getting your medications? No , she states the most she pays for medication is 10 dollars. Is there anything that you would like to discuss during the appointment?    Patient is aware to have her medications and supplements available at time of phone visit.     PCP : Cheryl Chard, MD  Allergies:   Allergies  Allergen Reactions  . Atorvastatin Calcium Itching  . Bee Venom Hives  . Oxycodone Other (See Comments)    hallucinations  . Vicodin [Hydrocodone-Acetaminophen] Other (See Comments)    hallucinations    Medications: Outpatient Encounter Medications as of 09/29/2020  Medication Sig  . allopurinol (ZYLOPRIM) 300 MG tablet TAKE 1 TABLET(300 MG) BY MOUTH DAILY  . amLODipine-benazepril (LOTREL) 5-10 MG capsule TAKE 1 CAPSULE BY MOUTH EVERY DAY  .  aspirin EC 81 MG tablet Take 1 tablet (81 mg total) by mouth daily.  Marland Kitchen atenolol-chlorthalidone (TENORETIC) 50-25 MG tablet Take one tablet by mouth daily.  . Cholecalciferol (VITAMIN D3) 1.25 MG (50000 UT) CAPS Take 1 capsule by mouth on Tuesday and Friday  . Dulaglutide (TRULICITY) 1.19 ER/7.4YC SOPN INJECT 0.5 ML(0.75 MG) SUBCUTANEOUSLY EVERY WEEK IN ABDOMEN, THIGH OR UPPER ARM ROTATING INJECTION SITE  . Lancets (ONETOUCH DELICA PLUS [id number]) MISC USE AS DIRECTED TO CHECK BLOOD SUGAR TWICE DAILY  . Magnesium 250 MG TABS Take 1 tablet (250 mg total) by mouth every evening. Take with dinner meal  . Multiple Vitamins-Minerals (ZINC PO) Take 1 tablet by mouth daily.   [name] VERIO test strip USE AS DIRECTED TO TEST BLOOD SUGAR TWICE DAILY  . Probiotic Product (PROBIOTIC PO) Take 1 capsule by mouth daily.   . rosuvastatin (CRESTOR) 20 MG tablet TAKE 1 TABLET(20 MG) BY MOUTH DAILY   No facility-administered encounter medications on file as of 09/29/2020.    Current Diagnosis: Patient Active Problem List   Diagnosis Date Noted  . Vitamin D deficiency 06/01/2020  . CAP (community acquired pneumonia) 05/06/2020  . Abdominal bloating 02/17/2019  . Urinary frequency 02/17/2019  . Uncontrolled type 2 diabetes mellitus with hyperglycemia ([name]) 02/17/2019  . Lightheaded 02/17/2019  . Constipation 06/26/2018  . Hyperlipidemia 08/23/2014  . Type 2 diabetes mellitus with stage 3 chronic kidney disease,  without long-term current use of insulin ([place]) 06/19/2014  . Mixed hyperlipidemia 06/19/2014  . S/P urological surgery 09/30/2013  . Joint pain 09/30/2013  . UTI (urinary tract infection) 09/30/2013  . Cough 12/10/2012  . OA (osteoarthritis) of knee 01/05/2012  . 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 Vaginal discharge 08/02/2011  . Spasm of lumbar paraspinous muscle 06/17/2011  . Morbidly obese ([place]) 05/31/2011  . HIP PAIN, LEFT, CHRONIC 11/09/2010  . CARPAL TUNNEL SYNDROME,  RIGHT 10/14/2010  . KNEE PAIN, RIGHT, CHRONIC 10/14/2010  . LEFT BUNDLE BRANCH BLOCK 07/26/2010  . 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 FATIGUE 07/01/2010  . CHEST PAIN, ATYPICAL 07/01/2010  . EPIGASTRIC PAIN 07/01/2010  . SUPRAPUBIC PAIN 07/01/2010  . HYPERGLYCEMIA 07/01/2010  . CELLULITIS AND ABSCESS OF OTHER SPECIFIED SITE 02/15/2010  . LUMP OR MASS IN BREAST 01/10/2010  . DYSURIA, CHRONIC 03/09/2009  . GERD 10/06/2008  . Personal history of colonic polyps-adenoma 08/26/2008  . LOC OSTEOARTHROS NOT SPEC PRIM/SEC LOWER LEG 09/10/2007  . GOUT, UNSPECIFIED 09/09/2007  . DIVERTICULOSIS, COLON 09/09/2007  . DIVERTICULITIS, HX OF 09/09/2007  . HYPERLIPIDEMIA 06/03/2007  . Essential hypertension 06/03/2007      Follow-Up:  Pharmacist Review - Patient states that she is having problems with getting depends , because of cost. Patient did contact [employer], but they do not help with cost.   [name], CPP. Notified  [name], [name] Pharmacist Assistant [phone]

## 2020-09-30 ENCOUNTER — Telehealth: Payer: Medicare Other

## 2020-10-01 ENCOUNTER — Ambulatory Visit: Payer: Self-pay

## 2020-10-01 ENCOUNTER — Other Ambulatory Visit: Payer: Self-pay | Admitting: Internal Medicine

## 2020-10-01 DIAGNOSIS — N183 Chronic kidney disease, stage 3 unspecified: Secondary | ICD-10-CM

## 2020-10-01 DIAGNOSIS — E1122 Type 2 diabetes mellitus with diabetic chronic kidney disease: Secondary | ICD-10-CM

## 2020-10-01 DIAGNOSIS — I10 Essential (primary) hypertension: Secondary | ICD-10-CM

## 2020-10-01 NOTE — Chronic Care Management (AMB) (Signed)
Chronic Care Management    Social Work Follow Up Note  10/01/2020 Name: [name] MRN: [id number] DOB: June 25, 1941  [name] is a 79 y.o. year old female who is a primary care patient of [name], MD. The CCM team was consulted for assistance with care coordination.   Review of patient status, including review of consultants reports, other relevant assessments, and collaboration with appropriate care team members and the patient's provider was performed as part of comprehensive patient evaluation and provision of chronic care management services.    SDOH (Social Determinants of Health) assessments performed: No    Outpatient Encounter Medications as of 10/01/2020  Medication Sig  . allopurinol (ZYLOPRIM) 300 MG tablet TAKE 1 TABLET(300 MG) BY MOUTH DAILY  . amLODipine-benazepril (LOTREL) 5-10 MG capsule TAKE 1 CAPSULE BY MOUTH EVERY DAY  . aspirin EC 81 MG tablet Take 1 tablet (81 mg total) by mouth daily.  Marland Kitchen atenolol-chlorthalidone (TENORETIC) 50-25 MG tablet Take one tablet by mouth daily.  . Cholecalciferol (VITAMIN D3) 1.25 MG (50000 UT) CAPS Take 1 capsule by mouth on Tuesday and Friday  . Dulaglutide (TRULICITY) 2.97 LG/9.2JJ SOPN INJECT 0.5 ML(0.75 MG) SUBCUTANEOUSLY EVERY WEEK IN ABDOMEN, THIGH OR UPPER ARM ROTATING INJECTION SITE  . Lancets (ONETOUCH DELICA PLUS [id number]) MISC USE AS DIRECTED TO CHECK BLOOD SUGAR TWICE DAILY  . Magnesium 250 MG TABS Take 1 tablet (250 mg total) by mouth every evening. Take with dinner meal  . Multiple Vitamins-Minerals (ZINC PO) Take 1 tablet by mouth daily.   [name] test strip USE AS DIRECTED TO TEST BLOOD SUGAR TWICE DAILY  . Probiotic Product (PROBIOTIC PO) Take 1 capsule by mouth daily.   . rosuvastatin (CRESTOR) 20 MG tablet TAKE 1 TABLET(20 MG) BY MOUTH DAILY   No facility-administered encounter medications on file as of 10/01/2020.     Patient Care Plan: Social Work Cae Plan    Problem Identified: Care  Coordination     Goal: Obtain Incontinent Supplies   Start Date: 10/01/2020  Priority: High  Note:   Current Barriers:  . Financial constraints related to cost of incontinent supplies . Lacks knowledge of health plan benefit . Chronic conditions including DM II, HTN, and CKD III which put patient at increased risk of hospitalization  Social Work Clinical Goal(s):  Marland Kitchen Over the next 45 days the patient will work with SW to obtain incontinent supplies from over the counter benefit . Over the next 70 days the patient will work with SW to become more knowledgeable of health plan benefit  CCM SW Interventions: Completed 10/01/20 . Inter-disciplinary care team collaboration (see longitudinal plan of care) . Successful outbound call placed to the patient in response to a voice message received . Discussed the patient is interested in resources to assist with the cost of incontinent supplies . Performed chart review to note patient is covered under [place] [employer] . Advised the patient of over the counter benefit available to members to purchase items such as vitamins, blood pressure cuffs, and incontinent supplies . Provided patient with number to [facility] to order supplies - patient at work and unable to place a joint call at this time . Advised the patient to contact SW as needed for assistance . Scheduled follow up call over the next month  Patient Goals/Self-Care Activities Over the next 20 days, patient will:   - Self administers medications as prescribed -Attend all scheduled provider appointments -Contact [employer] [phone] order incontinent supplies -Contact SW  as needed prior to next scheduled call  Follow up Plan:  SW will follow up with the patient over the next month        Follow Up Plan: SW will follow up with patient by phone over the next month.   [name], BSW, CDP Social Worker, Certified Dementia Practitioner [place] [facility] [place]  Management [phone]  Total time spent performing care coordination and/or care management activities with the patient by phone or face to face = 19 minutes.

## 2020-10-01 NOTE — Patient Instructions (Signed)
Patient Goals/Self-Care Activities Over the next 20 days, patient will:   - Self administers medications as prescribed -Attend all scheduled provider appointments -Contact [employer] [phone] order incontinent supplies -Contact SW as needed prior to next scheduled call

## 2020-10-07 ENCOUNTER — Telehealth: Payer: Self-pay

## 2020-10-07 NOTE — Chronic Care Management (AMB) (Signed)
Chronic Care Management Pharmacy Assistant   Name: [name]  MRN: [id number] DOB: 03-28-1941  Reason for Encounter: Medication Review  PCP : [name], MD  Allergies:   Allergies  Allergen Reactions  . Atorvastatin Calcium Itching  . Bee Venom Hives  . Oxycodone Other (See Comments)    hallucinations  . Vicodin [Hydrocodone-Acetaminophen] Other (See Comments)    hallucinations    Medications: Outpatient Encounter Medications as of 10/07/2020  Medication Sig  . allopurinol (ZYLOPRIM) 300 MG tablet TAKE 1 TABLET(300 MG) BY MOUTH DAILY  . amLODipine-benazepril (LOTREL) 5-10 MG capsule TAKE 1 CAPSULE BY MOUTH EVERY DAY  . aspirin EC 81 MG tablet Take 1 tablet (81 mg total) by mouth daily.  Marland Kitchen atenolol-chlorthalidone (TENORETIC) 50-25 MG tablet Take one tablet by mouth daily.  . Cholecalciferol (VITAMIN D3) 1.25 MG (50000 UT) CAPS Take 1 capsule by mouth on Tuesday and Friday  . Dulaglutide (TRULICITY) 4.19 FX/9.0WI SOPN INJECT 0.5 ML(0.75 MG) SUBCUTANEOUSLY EVERY WEEK IN ABDOMEN, THIGH OR UPPER ARM ROTATING INJECTION SITE  . Lancets (ONETOUCH DELICA PLUS [id number]) MISC USE AS DIRECTED TO CHECK BLOOD SUGAR TWICE DAILY  . Magnesium 250 MG TABS Take 1 tablet (250 mg total) by mouth every evening. Take with dinner meal  . Multiple Vitamins-Minerals (ZINC PO) Take 1 tablet by mouth daily.   [name] test strip USE AS DIRECTED TO TEST BLOOD SUGAR TWICE DAILY  . Probiotic Product (PROBIOTIC PO) Take 1 capsule by mouth daily.   . rosuvastatin (CRESTOR) 20 MG tablet TAKE 1 TABLET(20 MG) BY MOUTH DAILY   No facility-administered encounter medications on file as of 10/07/2020.    Current Diagnosis: Patient Active Problem List   Diagnosis Date Noted  . Vitamin D deficiency 06/01/2020  . CAP (community acquired pneumonia) 05/06/2020  . Abdominal bloating 02/17/2019  . Urinary frequency 02/17/2019  . Uncontrolled type 2 diabetes mellitus with hyperglycemia ([place])  02/17/2019  . Lightheaded 02/17/2019  . Constipation 06/26/2018  . Hyperlipidemia 08/23/2014  . Type 2 diabetes mellitus with stage 3 chronic kidney disease, without long-term current use of insulin ([place]) 06/19/2014  . Mixed hyperlipidemia 06/19/2014  . S/P urological surgery 09/30/2013  . Joint pain 09/30/2013  . UTI (urinary tract infection) 09/30/2013  . Cough 12/10/2012  . OA (osteoarthritis) of knee 01/05/2012  . 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 Vaginal discharge 08/02/2011  . Spasm of lumbar paraspinous muscle 06/17/2011  . Morbidly obese ([place]) 05/31/2011  . HIP PAIN, LEFT, CHRONIC 11/09/2010  . CARPAL TUNNEL SYNDROME, RIGHT 10/14/2010  . KNEE PAIN, RIGHT, CHRONIC 10/14/2010  . LEFT BUNDLE BRANCH BLOCK 07/26/2010  . 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 FATIGUE 07/01/2010  . CHEST PAIN, ATYPICAL 07/01/2010  . EPIGASTRIC PAIN 07/01/2010  . SUPRAPUBIC PAIN 07/01/2010  . HYPERGLYCEMIA 07/01/2010  . CELLULITIS AND ABSCESS OF OTHER SPECIFIED SITE 02/15/2010  . LUMP OR MASS IN BREAST 01/10/2010  . DYSURIA, CHRONIC 03/09/2009  . GERD 10/06/2008  . Personal history of colonic polyps-adenoma 08/26/2008  . LOC OSTEOARTHROS NOT SPEC PRIM/SEC LOWER LEG 09/10/2007  . GOUT, UNSPECIFIED 09/09/2007  . DIVERTICULOSIS, COLON 09/09/2007  . DIVERTICULITIS, HX OF 09/09/2007  . HYPERLIPIDEMIA 06/03/2007  . Essential hypertension 06/03/2007      Follow-Up:  Patient Assistance Coordination - New application form filled out to [employer], for [employer] . Waiting for providers and patient's signature and proof of income to fax.  Called patient to have her come into PCP office to sign assistance  form and to bring proof of income. Patient will be in on 11/01/2020, she is going out of town next week. Patient states she needs a new blood pressure machine , states the one she has is not working.  [name], CPP Notified  [name], [place] Pharmacist  Assistant [phone]

## 2020-10-08 ENCOUNTER — Ambulatory Visit (INDEPENDENT_AMBULATORY_CARE_PROVIDER_SITE_OTHER): Payer: Medicare Other

## 2020-10-08 DIAGNOSIS — Z23 Encounter for immunization: Secondary | ICD-10-CM | POA: Diagnosis not present

## 2020-10-08 NOTE — Progress Notes (Signed)
   Covid-19 Vaccination Clinic  Name:  [name]    MRN: [id number] DOB: 1941/09/25  10/08/2020  [name] was observed post Covid-19 immunization for 15 minutes without incident. She was provided with Vaccine Information Sheet and instruction to access the V-Safe system.   [name] was instructed to call 911 with any severe reactions post vaccine: Marland Kitchen Difficulty breathing  . Swelling of face and throat  . A fast heartbeat  . A bad rash all over body  . Dizziness and weakness   Immunizations Administered    No immunizations on file.

## 2020-10-12 ENCOUNTER — Ambulatory Visit: Payer: Medicare Other

## 2020-10-12 DIAGNOSIS — N183 Chronic kidney disease, stage 3 unspecified: Secondary | ICD-10-CM

## 2020-10-12 DIAGNOSIS — E1122 Type 2 diabetes mellitus with diabetic chronic kidney disease: Secondary | ICD-10-CM

## 2020-10-12 DIAGNOSIS — N3941 Urge incontinence: Secondary | ICD-10-CM

## 2020-10-12 NOTE — Chronic Care Management (AMB) (Signed)
Chronic Care Management    Social Work Follow Up Note  10/12/2020 Name: [name] MRN: [id number] DOB: 1941/05/14  [name] is a 79 y.o. year old female who is a primary care patient of [name], MD. The CCM team was consulted for assistance with care coordination.   Review of patient status, including review of consultants reports, other relevant assessments, and collaboration with appropriate care team members and the patient's provider was performed as part of comprehensive patient evaluation and provision of chronic care management services.    SDOH (Social Determinants of Health) assessments performed: No    Outpatient Encounter Medications as of 10/12/2020  Medication Sig  . allopurinol (ZYLOPRIM) 300 MG tablet TAKE 1 TABLET(300 MG) BY MOUTH DAILY  . amLODipine-benazepril (LOTREL) 5-10 MG capsule TAKE 1 CAPSULE BY MOUTH EVERY DAY  . aspirin EC 81 MG tablet Take 1 tablet (81 mg total) by mouth daily.  Marland Kitchen atenolol-chlorthalidone (TENORETIC) 50-25 MG tablet Take one tablet by mouth daily.  . Cholecalciferol (VITAMIN D3) 1.25 MG (50000 UT) CAPS Take 1 capsule by mouth on Tuesday and Friday  . Dulaglutide (TRULICITY) 3.29 JJ/8.8CZ SOPN INJECT 0.5 ML(0.75 MG) SUBCUTANEOUSLY EVERY WEEK IN ABDOMEN, THIGH OR UPPER ARM ROTATING INJECTION SITE  . Lancets (ONETOUCH DELICA PLUS [id number]) MISC USE AS DIRECTED TO CHECK BLOOD SUGAR TWICE DAILY  . Magnesium 250 MG TABS Take 1 tablet (250 mg total) by mouth every evening. Take with dinner meal  . Multiple Vitamins-Minerals (ZINC PO) Take 1 tablet by mouth daily.   [name] test strip USE AS DIRECTED TO TEST BLOOD SUGAR TWICE DAILY  . Probiotic Product (PROBIOTIC PO) Take 1 capsule by mouth daily.   . rosuvastatin (CRESTOR) 20 MG tablet TAKE 1 TABLET(20 MG) BY MOUTH DAILY   No facility-administered encounter medications on file as of 10/12/2020.     Patient Care Plan: Social Work Care Plan    Problem Identified: Care  Coordination     Goal: Obtain Incontinent Supplies   Start Date: 10/01/2020  Expected End Date: 12/11/2020  This Visit's Progress: On track  Priority: High  Note:   Current Barriers:  . Financial constraints related to cost of incontinent supplies . Lacks knowledge of health plan benefit . Chronic conditions including DM II, HTN, and CKD III which put patient at increased risk of hospitalization  Social Work Clinical Goal(s):  Marland Kitchen Over the next 45 days the patient will work with SW to obtain incontinent supplies from over the counter benefit . Over the next 70 days the patient will work with SW to become more knowledgeable of health plan benefit  CCM SW Interventions: Completed 10/01/20 . Inter-disciplinary care team collaboration (see longitudinal plan of care) . Successful outbound call placed to the patient to assess goal progression . Determined the patient received a call from her health plan that incontinent supplies were not a covered benefit. Patient reports her primary care provider submitted a prescription but her health plan will not cover the cost . Re-educated the patient on her health plan OTC benefit . Assisted the patient with a joint call to [name] [phone]) her health plans OTC vendor . Determined the patient receives $50 per quarter to use on OTC products - the patient is in need of XL briefs which are $25.13 for a 64 count box (Product #[id number]) . Successfully ordered two boxes for the patient with the patient paying the difference of $3.65 . Received a confirmation the products would be delivered Friday December 17th  to the patients work address ([place]) as requested by the patient . Encouraged the patient to access this benefit each quarter to offset the cost of incontinent products . Scheduled follow up call over the next 60 days  Patient Goals/Self-Care Activities Over the next 60 days, patient will:   - Self administers medications as  prescribed -Attend all scheduled provider appointments -Contact [employer] [phone] order incontinent supplies as needed -Contact SW as needed prior to next scheduled call  Follow up Plan:  SW will contact the patient over the next 60 days       [name], BSW, CDP Social Worker, Certified Dementia Practitioner [place] [facility] [place] [facility] [phone]  Total time spent performing care coordination and/or care management activities with the patient by phone or face to face = 40 minutes.

## 2020-10-12 NOTE — Patient Instructions (Signed)
Goals we discussed today:  Goals Addressed    .  Obtain incontient supplies        Timeframe:  Short-Term Goal Priority:  High Start Date:  12.3.21                           Expected End Date:  2.12.22                     Next date of contact: 2.14.22  Patient Goals/Self-Care Activities Over the next 60 days, patient will:   - Self administers medications as prescribed -Attend all scheduled provider appointments -Contact [employer] [phone] order incontinent supplies as needed -Contact SW as needed prior to next scheduled call

## 2020-10-19 ENCOUNTER — Telehealth: Payer: Medicare Other

## 2020-11-03 ENCOUNTER — Telehealth: Payer: Self-pay

## 2020-11-03 ENCOUNTER — Other Ambulatory Visit: Payer: Medicare Other

## 2020-11-03 ENCOUNTER — Other Ambulatory Visit: Payer: Self-pay

## 2020-11-03 DIAGNOSIS — Z1152 Encounter for screening for COVID-19: Secondary | ICD-10-CM

## 2020-11-03 NOTE — Chronic Care Management (AMB) (Signed)
11/03/2020   Called patient to remind her of telephone appointment with Cherylin Mylar, Pharmacist on 11/05/2019. Patient is aware to have all medications and supplements at time of visit.  Patient states that she went to PCP office today for Covid testing. Patient has not received results yet.  Patient also would like to schedule a time and date to come in to office to sign patient assistance application.   Cherylin Mylar, CPP. Notified  Jon Gills, [facility] Clinical Pharmacist Assistant [phone]

## 2020-11-04 ENCOUNTER — Ambulatory Visit: Payer: Medicare Other

## 2020-11-04 DIAGNOSIS — E1122 Type 2 diabetes mellitus with diabetic chronic kidney disease: Secondary | ICD-10-CM

## 2020-11-04 DIAGNOSIS — I10 Essential (primary) hypertension: Secondary | ICD-10-CM

## 2020-11-04 DIAGNOSIS — E785 Hyperlipidemia, unspecified: Secondary | ICD-10-CM

## 2020-11-04 DIAGNOSIS — I129 Hypertensive chronic kidney disease with stage 1 through stage 4 chronic kidney disease, or unspecified chronic kidney disease: Secondary | ICD-10-CM

## 2020-11-04 DIAGNOSIS — N183 Chronic kidney disease, stage 3 unspecified: Secondary | ICD-10-CM

## 2020-11-04 NOTE — Chronic Care Management (AMB) (Signed)
Chronic Care Management Pharmacy  Name: [name]  MRN: [id number] DOB: May 24, 1941  Chief Complaint/ HPI  [name],  80 y.o. , female presents for their Initial CCM visit with the clinical pharmacist via telephone due to COVID-19 Pandemic. She has 6 children, 17 grand children and 23 great grand children. 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 She has been helping those with COVID with breakfast, lunch and dinner. She has been placing food on the porch so she can eat. She loves people and works with a lot of people. She reports having a few health issues including: Diabetes, Hypertension and some other problems. She works Scientist, product/process development. She is using a lot of pads because she dribbles, the doctor made a mistake and cut her urethra. She purchased some pads from the book through [facility]. She went to the [place] in her 48's to represent small businesses. She started a business with 75 dollars. This is the 16 th birthday. 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 She has won quite a few awards including a Artist. She also has an award for the [employer] by [employer]. She took a choir from [employer] to 182 members. She reports that she knows how to put things together. She is really excited about taking socks to her friends in nursing homes, grandkids and great grandkids. She has been married for 62 years. Her joy is helping people. She reports that she knows what it is like to be without. She is building a 52 unit independent senior facility. They had to stop due to COVID but they have started back again in January. Married on Christmas Day so they go away every year.   PCP : Cheryl Chard, MD  Their chronic conditions include: Diabetes, Hypertension, Hyperlipidemia  Office Visits:  09/16/2020 MD OV    09/01/2020 MD OV High Dose QUAD Flu Vaccine Given, referral to urology  Consult Visit: 05/13/2020 [facility] - pain in joint   ED Visit: 08/28/2020 ED visit for acute cystitis   Medications: Outpatient Encounter Medications as of 11/04/2020  Medication Sig  . allopurinol (ZYLOPRIM) 300 MG tablet TAKE 1 TABLET(300 MG) BY MOUTH DAILY  . amLODipine-benazepril (LOTREL) 5-10 MG capsule TAKE 1 CAPSULE BY MOUTH EVERY DAY  . aspirin EC 81 MG tablet Take 1 tablet (81 mg total) by mouth daily.  Marland Kitchen atenolol-chlorthalidone (TENORETIC) 50-25 MG tablet Take one tablet by mouth daily.  . Cholecalciferol (VITAMIN D3) 1.25 MG (50000 UT) CAPS Take 1 capsule by mouth on Tuesday and Friday  . Dulaglutide (TRULICITY) 0.97 DZ/3.2DJ SOPN INJECT 0.5 ML(0.75 MG) SUBCUTANEOUSLY EVERY WEEK IN ABDOMEN, THIGH OR UPPER ARM ROTATING INJECTION SITE  . Lancets (ONETOUCH DELICA PLUS [id number]) MISC USE AS DIRECTED TO CHECK BLOOD SUGAR TWICE DAILY  . Magnesium 250 MG TABS Take 1 tablet (250 mg total) by mouth every evening. Take with dinner meal  . Multiple Vitamins-Minerals (ZINC PO) Take 1 tablet by mouth daily.   [name] test strip USE AS DIRECTED TO TEST BLOOD SUGAR TWICE DAILY  . Probiotic Product (PROBIOTIC PO) Take 1 capsule by mouth daily.   . rosuvastatin (CRESTOR) 20 MG tablet TAKE 1 TABLET(20 MG) BY MOUTH DAILY   No facility-administered encounter medications on file as of 11/04/2020.     Current  Diagnosis/Assessment:  Goals Addressed              This Visit's Progress   .  Pharmacy Care Plan (pt-stated)        CARE PLAN ENTRY (see longitudinal plan of care for additional care plan information)  Current Barriers:  . Chronic Disease Management support, education, and care coordination needs related to Hypertension, Hyperlipidemia, and Diabetes   Hypertension BP Readings from Last 3 Encounters:  09/01/20 130/74  08/28/20 134/67  06/03/20 122/60   . Pharmacist Clinical Goal(s): o Over  the next 90 days, patient will work with PharmD and providers to maintain BP goal <130/80 . Current regimen:  o Atenolol - Chlorthalidone 50-25 mg- take one tablet daily o Amlodipine - Benazepril 5 - 10 mg - take 1 capsule daily . Interventions: o Patient to take medication at the same time each day . Patient self care activities - Over the next 90 days, patient will: o Check BP 1 time per week, document, and provide at future appointments o Ensure daily salt intake < 2300 mg/day  Hyperlipidemia Lab Results  Component Value Date/Time   LDLCALC 103 (H) 06/01/2020 04:09 PM   LDLDIRECT 162.2 11/21/2013 11:35 AM   . Pharmacist Clinical Goal(s): o Over the next 90 days, patient will work with PharmD and providers to achieve LDL goal < 70 . Current regimen:  o Crestor 20 mg tablet take daily  . Interventions: o Spoke to patient about the importance of reducing fried and fatty foods.  o Replacing refined grain products with higher fiber and whole grain products when possible  o Using her exercise bike that she recently purchased  . Patient self care activities - Over the next 90 days, patient will: o Take medication everyday o Work out 30 minutes at least two times per week .   Diabetes Lab Results  Component Value Date/Time   HGBA1C 6.3 (H) 09/01/2020 03:20 PM   HGBA1C 6.7 (H) 05/06/2020 04:20 AM   . Pharmacist Clinical Goal(s): o Over the next 90 days, patient will work with PharmD and providers to maintain A1c goal <7% . Current regimen:  o Trulicity 0.16 WF/0.9 mL - inject 0.75 mg every other week on Friday . Interventions: o Patient to drink at least 64 ounces of water per day  o Encouraged to check BS at least once a day  . Patient self care activities - Over the next 90 days, patient will: o Check blood sugar once daily, document, and provide at future appointments o Contact provider with any episodes of hypoglycemia   Medication management . Pharmacist Clinical  Goal(s): o Over the next 90 days, patient will work with PharmD and providers to maintain optimal medication adherence . Current pharmacy: [facility]  . Interventions o Comprehensive medication review performed. o Continue current medication management strategy . Patient self care activities - Over the next 90 days, patient will: o Focus on medication adherence by using a pill reminder system  o Take medications as prescribed o Report any questions or concerns to PharmD and/or provider(s)  Initial goal documentation       Social Determinants of Health with Concerns   Financial Resource Strain: High Risk  . Difficulty of Paying Living Expenses: Hard  Physical Activity: Inactive  . Days of Exercise per Week: 0 days  . Minutes of Exercise per Session: 0 min  Social Connections: Not on file  Intimate Partner Violence: Not on file  Alcohol Screen: Not on file  Housing: Not on file     Hypertension  BP Goal <130/80  BP today is:  None due to telehealth visit   Office blood pressures are  BP Readings from Last 3 Encounters:  09/01/20 130/74  08/28/20 134/67  06/03/20 122/60   Patients current medications:  Amlodipine- Benazepril 5-10 mg- capsule by mouth daily  Benazepril 10 mg once a day, changed by the kidney doctor  Atenolol- Chlorthalidone 50-25 mg- take daily    Patient has failed these meds in the past: none reports  Patient checks BP at home infrequently  Patient home BP readings are ranging: none reported  We discussed :  Diet and Lifestyle   Patient is working on eating more healthy  Patient reports being very busy at her business  Working on adding more healthy options to her current diet  Plan  Continue current medications   Diabetes   A1c goal <7%  Recent Relevant Labs: Lab Results  Component Value Date/Time   HGBA1C 6.3 (H) 09/01/2020 03:20 PM   HGBA1C 6.7 (H) 05/06/2020 04:20 AM   GFR 53.18 (L) 06/19/2014 03:42 PM   GFR  41.03 (L) 06/09/2014 09:23 AM   MICROALBUR 10 03/19/2019 02:04 PM    Last diabetic Eye exam: No results found for: HMDIABEYEEXA  Last diabetic Foot exam: No results found for: HMDIABFOOTEX   Checking BG: Weekly (Every 2-3 days)  Recent FBG Readings: 105 -140   Patient has failed these meds in past:  Patient is currently controlled on the following medications:  Trulicity 8.84 ZY/6.0 mL - inject 0.75 mg every other week on Friday  Dose changed by specialist during initial appointment  We discussed:   Patient does not miss her medication   She used to weight 345 lbs she lost 100 lbs and put on 30 lbs and   We discussed diet extensively:  She enjoys starches but she limits that amount of starches that she has:  Last night for dinner she had: string beans and potatoes   Sometimes she tries to get something quick to eat but is hard to be healthy quickly   Recommended frozen vegetables as another option   She reports checking her BS 2-3 days in the week  Signs and Symptoms of hypoglycemia   Pt has had low BS once in the past but not recently   Plan Continue current medications  Mixed Hyperlipidemia   LDL goal < 70  Last lipids Lab Results  Component Value Date   CHOL 170 06/01/2020   HDL 38 (L) 06/01/2020   LDLCALC 103 (H) 06/01/2020   LDLDIRECT 162.2 11/21/2013   TRIG 167 (H) 06/01/2020   CHOLHDL 4.5 (H) 06/01/2020   Hepatic Function Latest Ref Rng & Units 08/28/2020 05/12/2020 05/08/2020  Total Protein 6.5 - 8.1 g/dL 6.9 7.1 6.0(L)  Albumin 3.5 - 5.0 g/dL 3.5 4.2 3.1(L)  AST 15 - 41 U/L 36 53(H) 73(H)  ALT 0 - 44 U/L 30 55(H) 52(H)  Alk Phosphatase 38 - 126 U/L 42 53 37(L)  Total Bilirubin 0.3 - 1.2 mg/dL 1.0 0.9 1.3(H)  Bilirubin, Direct 0.1 - 0.5 mg/dL - - -     The 10-year ASCVD risk score Mikey Bussing DC Jr., et al., 2013) is: 27.9%   Values used to calculate the score:     Age: 57 years     Sex: Female     Is Non-Hispanic African American: Yes      Diabetic: Yes     Tobacco smoker: No  Systolic Blood Pressure: 952 mmHg     Is BP treated: Yes     HDL Cholesterol: 38 mg/dL     Total Cholesterol: 170 mg/dL   Patient has failed these meds in past: none reported Patient is currently uncontrolled on the following medications:  . Crestor 20 mg-take daily . ASA 81 mg daily  We discussed:   We discussed exercise and diet in detail  Eating a diet low in saturated fats  Reduce fried and fatty foods   Replacing refined grain products with higher fiber and whole grain products when possible   Drinking tea, carbonated water or plain water instead sugar - sweetened soft drinks and fruit juices  Increase vegetables   She recently bought an exercise machine and she is going to try to increase her physical activity   Patient to start working out 30 minutes at least two times per week  Plan Continue current medications  CPA to do hyperlipidemia assessment on patient once a month   GOUT    Patient has failed these meds in past:  Patient is currently controlled on the following medications:  . Allopurinol 300 MG - take daily  We discussed:   -Will discuss at the next office visit   Plan Continue current medications   Chronic Kidney Disease  Stage 3b   Lab Results  Component Value Date   CREATININE 1.21 (H) 08/28/2020   BUN 27 (H) 08/28/2020   GFR 53.18 (L) 06/19/2014   GFRNONAA 46 (L) 08/28/2020   GFRAA 54 (L) 06/01/2020   NA 138 08/28/2020   K 3.3 (L) 08/28/2020   CALCIUM 9.0 08/28/2020   CO2 25 08/28/2020     Patient has failed these meds in past: none noted  Patient is currently controlled on the following medications:  . Crestor 20 mg tablet daily . Amlodipine-Benazepril 5-10 mg capsule daily . Atenolol-Chlorthalidone 50-25 mg tablet daily  We will discuss during next office visit.   Plan  Continue current medications. I have reviewed all medications to determine that they are dosed appropriately and  will continue to monitor renal function.  Will collaborate with PCP to see if patient is a candidate to [place] for Diabetes and CKD.    Vitamin D Deficiency     Last vitamin D/Calcium Lab Results  Component Value Date   VD25OH 22.8 (L) 06/01/2020   CALCIUM 9.0 08/28/2020    Patient has failed these meds in past:  Patient is currently uncontrolled on the following medications:  Marland Kitchen Vitamin D 50,000 Units on Tuesdays and Fridays.   We will discuss further during next office visit.   Plan  Continue current medications  Vaccines   Reviewed and discussed patient's vaccination history.    Immunization History  Administered Date(s) Administered  . Fluad Quad(high Dose 65+) 09/01/2020  . Influenza Split 07/25/2012  . Influenza, High Dose Seasonal PF 10/08/2018, 10/02/2019  . Influenza,inj,Quad PF,6+ Mos 11/21/2013  . Influenza-Unspecified 07/25/2012, 11/21/2013  . Moderna SARS-COV2 Booster Vaccination 10/08/2020, 10/08/2020  . Moderna Sars-Covid-2 Vaccination 12/03/2019, 01/01/2020  . Pneumococcal Polysaccharide-23 11/21/2013  . Pneumococcal-Unspecified 11/21/2013  . Td 01/29/1999, 11/21/2013    Plan Patient is currently up to date.   Health Maintenance   Patient is currently controlled on the following medications:  . Probiotic - take daily  . Multivitamin with Zinc - take daily . Airborne -2 gummies daily . Elderberry   Will discuss further during the next office visit.   Plan  Continue current medications  Medication Management    Patient's preferred pharmacy is:  [facility] [phone] - [place], [place] - [address] [place] [address] Phone: [phone] Fax: [phone]  Uses pill box? Yes Pt endorses 90-95% compliance  We discussed: Discussed benefits of medication synchronization, packaging and delivery as well as enhanced pharmacist oversight with  [employer].  Plan  Continue current medication management strategy    Follow up: 3 month phone visit  [name], PharmD Clinical Pharmacist [facility] [phone]

## 2020-11-05 LAB — NOVEL CORONAVIRUS, NAA: SARS-CoV-2, NAA: NOT DETECTED

## 2020-11-05 LAB — SARS-COV-2, NAA 2 DAY TAT

## 2020-11-08 ENCOUNTER — Telehealth: Payer: Self-pay

## 2020-11-08 NOTE — Chronic Care Management (AMB) (Signed)
Chronic Care Management Pharmacy Assistant   Name: [name]  MRN: [id number] DOB: [birth year]  Reason for Encounter: Patient Assistance Coordination   PCP : [name], MD    11/08/20- Called patient to reschedule a time she could come back to the office and sign her patient assistance paperwork for Trulicity with [employer]. Patient did come into the office on 11/01/2020 but due to being exposed to [place] and being tested for COVID she was asked to return. Patient states she is feeling much better and has been taking care of 5 of her family members family due to them having COVID, she has been cooking for them and taking them their meals. Patient is able to come in on 11/09/2020 to sign form and bring proof on income.  Patient also requested new blood pressure monitor with and X-Large cuff, the one she has is no longer working. Informed patient that some insurance are not covering blood pressure monitors through the pharmacy, they will cover more under medical equipment and the office usually uses [employer]. Patient aware that I  will send a request to [name], CPP who will request a blood pressure monitor from PCP and the office should be calling with updates or she will get a call from [facility] regarding delivery.    Allergies:   Allergies  Allergen Reactions   Atorvastatin Calcium Itching   Bee Venom Hives   Oxycodone Other (See Comments)    hallucinations   Vicodin [Hydrocodone-Acetaminophen] Other (See Comments)    hallucinations    Medications: Outpatient Encounter Medications as of 11/08/2020  Medication Sig Note   allopurinol (ZYLOPRIM) 300 MG tablet TAKE 1 TABLET(300 MG) BY MOUTH DAILY    amLODipine-benazepril (LOTREL) 5-10 MG capsule TAKE 1 CAPSULE BY MOUTH EVERY DAY    aspirin EC 81 MG tablet Take 1 tablet (81 mg total) by mouth daily.    atenolol-chlorthalidone (TENORETIC) 50-25 MG tablet Take one tablet by mouth daily.     Cholecalciferol (VITAMIN D3) 1.25 MG (50000 UT) CAPS Take 1 capsule by mouth on Tuesday and Friday    Dulaglutide (TRULICITY) 4.09 WJ/1.9JY SOPN INJECT 0.5 ML(0.75 MG) SUBCUTANEOUSLY EVERY WEEK IN ABDOMEN, THIGH OR UPPER ARM ROTATING INJECTION SITE (Patient not taking: Reported on 11/04/2020) 11/04/2020: Per endocrinologist patient to take trulicity every other week.    Lancets (ONETOUCH DELICA PLUS [id number]) MISC USE AS DIRECTED TO CHECK BLOOD SUGAR TWICE DAILY    Magnesium 250 MG TABS Take 1 tablet (250 mg total) by mouth every evening. Take with dinner meal    Multiple Vitamins-Minerals (ZINC PO) Take 1 tablet by mouth daily.     ONETOUCH VERIO test strip USE AS DIRECTED TO TEST BLOOD SUGAR TWICE DAILY    Probiotic Product (PROBIOTIC PO) Take 1 capsule by mouth daily.     rosuvastatin (CRESTOR) 20 MG tablet TAKE 1 TABLET(20 MG) BY MOUTH DAILY    No facility-administered encounter medications on file as of 11/08/2020.    Current Diagnosis: Patient Active Problem List   Diagnosis Date Noted   Vitamin D deficiency 06/01/2020   CAP (community acquired pneumonia) 05/06/2020   Abdominal bloating 02/17/2019   Urinary frequency 02/17/2019   Uncontrolled type 2 diabetes mellitus with hyperglycemia ([place]) 02/17/2019   Lightheaded 02/17/2019   Constipation 06/26/2018   Hyperlipidemia 08/23/2014   Type 2 diabetes mellitus with stage 3 chronic kidney disease, without long-term current use of insulin ([place]) 06/19/2014   Mixed hyperlipidemia 06/19/2014   S/P urological surgery  09/30/2013   Joint pain 09/30/2013   UTI (urinary tract infection) 09/30/2013   Cough 12/10/2012   OA (osteoarthritis) of knee 01/05/2012   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 08/02/2011   Vaginal discharge 08/02/2011   Spasm of lumbar paraspinous muscle 06/17/2011   Morbidly obese (HCC) 05/31/2011   HIP PAIN, LEFT, CHRONIC 11/09/2010   CARPAL TUNNEL SYNDROME, RIGHT 10/14/2010   KNEE PAIN,  RIGHT, CHRONIC 10/14/2010   LEFT BUNDLE BRANCH BLOCK 70/35/0093   HELICOBACTER PYLORI GASTRITIS 07/18/2010   FATIGUE 07/01/2010   CHEST PAIN, ATYPICAL 07/01/2010   EPIGASTRIC PAIN 07/01/2010   SUPRAPUBIC PAIN 07/01/2010   HYPERGLYCEMIA 07/01/2010   CELLULITIS AND ABSCESS OF OTHER SPECIFIED SITE 02/15/2010   LUMP OR MASS IN BREAST 01/10/2010   DYSURIA, CHRONIC 03/09/2009   GERD 10/06/2008   Personal history of colonic polyps-adenoma 08/26/2008   LOC OSTEOARTHROS NOT SPEC PRIM/SEC LOWER LEG 09/10/2007   GOUT, UNSPECIFIED 09/09/2007   DIVERTICULOSIS, COLON 09/09/2007   DIVERTICULITIS, HX OF 09/09/2007   HYPERLIPIDEMIA 06/03/2007   Essential hypertension 06/03/2007     Follow-Up:  Patient Assistance Coordination and Pharmacist Review   [name], CPP notified.  Pattricia Boss, [facility] Pharmacist Assistant [phone]

## 2020-11-21 ENCOUNTER — Other Ambulatory Visit: Payer: Self-pay | Admitting: Internal Medicine

## 2020-11-22 ENCOUNTER — Other Ambulatory Visit: Payer: Self-pay | Admitting: Internal Medicine

## 2020-11-23 ENCOUNTER — Ambulatory Visit: Payer: Self-pay

## 2020-11-23 DIAGNOSIS — N183 Chronic kidney disease, stage 3 unspecified: Secondary | ICD-10-CM

## 2020-11-23 DIAGNOSIS — E1122 Type 2 diabetes mellitus with diabetic chronic kidney disease: Secondary | ICD-10-CM

## 2020-11-23 DIAGNOSIS — I10 Essential (primary) hypertension: Secondary | ICD-10-CM

## 2020-11-23 NOTE — Chronic Care Management (AMB) (Signed)
Chronic Care Management    Social Work Note  11/23/2020 Name: [name] MRN: [id number] DOB: [birth year]  [name] is a 80 y.o. year old female who is a primary care patient of [name], MD. The CCM team was consulted to assist the patient with chronic disease management and/or care coordination needs related to: [employer] .   Engaged with patient by telephone for follow up visit in response to provider referral for social work chronic care management and care coordination services.   Consent to Services:  The patient was given information about Chronic Care Management services, agreed to services, and gave verbal consent prior to initiation of services.  Please see initial visit note for detailed documentation.   Patient agreed to services and consent obtained.   Assessment: Review of patient past medical history, allergies, medications, and health status, including review of relevant consultants reports was performed today as part of a comprehensive evaluation and provision of chronic care management and care coordination services.     SDOH (Social Determinants of Health) assessments and interventions performed: No   Advanced Directives Status: Not addressed in this encounter.  CCM Care Plan  Allergies  Allergen Reactions  . Atorvastatin Calcium Itching  . Bee Venom Hives  . Oxycodone Other (See Comments)    hallucinations  . Vicodin [Hydrocodone-Acetaminophen] Other (See Comments)    hallucinations    Outpatient Encounter Medications as of 11/23/2020  Medication Sig Note  . allopurinol (ZYLOPRIM) 300 MG tablet TAKE 1 TABLET(300 MG) BY MOUTH DAILY   . amLODipine-benazepril (LOTREL) 5-10 MG capsule TAKE 1 CAPSULE BY MOUTH EVERY DAY   . aspirin EC 81 MG tablet Take 1 tablet (81 mg total) by mouth daily.   Marland Kitchen atenolol-chlorthalidone (TENORETIC) 50-25 MG tablet Take one tablet by mouth daily.   . Cholecalciferol (VITAMIN D3) 1.25 MG (50000 UT) CAPS Take 1  capsule by mouth on Tuesday and Friday   . Dulaglutide (TRULICITY) 0.34 VQ/2.5ZD SOPN INJECT 0.5 ML(0.75 MG) SUBCUTANEOUSLY EVERY WEEK IN ABDOMEN, THIGH OR UPPER ARM ROTATING INJECTION SITE (Patient not taking: Reported on 11/04/2020) 11/04/2020: Per endocrinologist patient to take trulicity every other week.   . Lancets (ONETOUCH DELICA PLUS GLOVFI43P) MISC USE AS DIRECTED TO CHECK BLOOD SUGAR TWICE DAILY   . Magnesium 250 MG TABS Take 1 tablet (250 mg total) by mouth every evening. Take with dinner meal   . Multiple Vitamins-Minerals (ZINC PO) Take 1 tablet by mouth daily.    [name] test strip USE AS DIRECTED TO TEST BLOOD SUGAR TWICE DAILY   . Probiotic Product (PROBIOTIC PO) Take 1 capsule by mouth daily.    . rosuvastatin (CRESTOR) 20 MG tablet TAKE 1 TABLET(20 MG) BY MOUTH DAILY   . Vitamin D, Ergocalciferol, (DRISDOL) 1.25 MG (50000 UNIT) CAPS capsule TAKE 1 CAPSULE BY MOUTH ON TUESDAYS AND FRIDAYS    No facility-administered encounter medications on file as of 11/23/2020.    Patient Active Problem List   Diagnosis Date Noted  . Vitamin D deficiency 06/01/2020  . CAP (community acquired pneumonia) 05/06/2020  . Abdominal bloating 02/17/2019  . Urinary frequency 02/17/2019  . Uncontrolled type 2 diabetes mellitus with hyperglycemia ([place]) 02/17/2019  . Lightheaded 02/17/2019  . Constipation 06/26/2018  . Hyperlipidemia 08/23/2014  . Type 2 diabetes mellitus with stage 3 chronic kidney disease, without long-term current use of insulin ([place]) 06/19/2014  . Mixed hyperlipidemia 06/19/2014  . S/P urological surgery 09/30/2013  . Joint pain 09/30/2013  . UTI (urinary tract  infection) 09/30/2013  . Cough 12/10/2012  . OA (osteoarthritis) of knee 01/05/2012  . 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 Vaginal discharge 08/02/2011  . Spasm of lumbar paraspinous muscle 06/17/2011  . Morbidly obese ([place]) 05/31/2011  . HIP PAIN, LEFT, CHRONIC 11/09/2010  . CARPAL TUNNEL  SYNDROME, RIGHT 10/14/2010  . KNEE PAIN, RIGHT, CHRONIC 10/14/2010  . LEFT BUNDLE BRANCH BLOCK 07/26/2010  . 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 FATIGUE 07/01/2010  . CHEST PAIN, ATYPICAL 07/01/2010  . EPIGASTRIC PAIN 07/01/2010  . SUPRAPUBIC PAIN 07/01/2010  . HYPERGLYCEMIA 07/01/2010  . CELLULITIS AND ABSCESS OF OTHER SPECIFIED SITE 02/15/2010  . LUMP OR MASS IN BREAST 01/10/2010  . DYSURIA, CHRONIC 03/09/2009  . GERD 10/06/2008  . Personal history of colonic polyps-adenoma 08/26/2008  . LOC OSTEOARTHROS NOT SPEC PRIM/SEC LOWER LEG 09/10/2007  . GOUT, UNSPECIFIED 09/09/2007  . DIVERTICULOSIS, COLON 09/09/2007  . DIVERTICULITIS, HX OF 09/09/2007  . HYPERLIPIDEMIA 06/03/2007  . Essential hypertension 06/03/2007    Conditions to be addressed/monitored: HTN, DMII and CKD Stage III; care coordination  Care Plan : Social Work Care Plan  Updates made by [name] since 11/23/2020 12:00 AM    Problem: Care Coordination     Goal: Access health plan benefits   Start Date: 10/01/2020  Expected End Date: 12/11/2020  Recent Progress: On track  Priority: High  Note:   Current Barriers:  . Financial constraints related to cost of incontinent supplies . Lacks knowledge of health plan benefit . Chronic conditions including DM II, HTN, and CKD III which put patient at increased risk of hospitalization  Social Work Clinical Goal(s):  Marland Kitchen Over the next 45 days the patient will work with SW to obtain incontinent supplies from over the counter benefit . Over the next 70 days the patient will work with SW to become more knowledgeable of health plan benefit  CCM SW Interventions: Completed 1.25.22 . 1:1 collaboration with [name], MD regarding development and update of comprehensive plan of care as evidenced by provider attestation and co-signature . Inter-disciplinary care team collaboration (see longitudinal plan of care) . Inbound call received from the patient to discuss  desire to obtain a [employer] . Advised the patient SW would request [name], PharmD outreach her regarding diabetic testing supplies to review if patient is eligible for machine . In basket message sent to [name] and Pattricia Boss requesting patient follow up . Discussed the patient is interested in obtaining a new BP cuff . Provided education on Over the Counter benefit . Determined the patient will review her OTC catalog and outreach SW if needed to purchase supplies . Scheduled follow up call over the next month  Completed 10/01/20 . Inter-disciplinary care team collaboration (see longitudinal plan of care) . Successful outbound call placed to the patient to assess goal progression . Determined the patient received a call from her health plan that incontinent supplies were not a covered benefit. Patient reports her primary care provider submitted a prescription but her health plan will not cover the cost . Re-educated the patient on her health plan OTC benefit . Assisted the patient with a joint call to [name] [phone]) her health plans OTC vendor . Determined the patient receives $50 per quarter to use on OTC products - the patient is in need of XL briefs which are $25.13 for a 64 count box (Product [id number]) . Successfully ordered two boxes for the patient with the patient paying the difference of $3.65 .  Received a confirmation the products would be delivered Friday December 17th to the patients work address ([place]) as requested by the patient . Encouraged the patient to access this benefit each quarter to offset the cost of incontinent products . Scheduled follow up call over the next 60 days  Patient Goals/Self-Care Activities Over the next 60 days, patient will:   - Self administers medications as prescribed -Attend all scheduled provider appointments -Review OTC catalog to determine if a BP cuff is offered -Work with embedded PharmD  regarding desired diabetic testing supplies  -Contact SW as needed prior to next scheduled call  Follow up Plan:  SW will contact the patient over the next 30 days       Follow Up Plan: SW will follow up with patient by phone over the next month.      [name], BSW, CDP Social Worker, Certified Dementia Practitioner [place] [facility] [phone]  Total time spent performing care coordination and/or care management activities with the patient by phone or face to face = 10 minutes.

## 2020-11-23 NOTE — Patient Instructions (Signed)
   Goals we discussed today:  Goals Addressed            This Visit's Progress   . Access over the counter catalog       Timeframe:  Short-Term Goal Priority:  High Start Date:  12.3.21                           Expected End Date:  2.12.22                     Next date of contact: 2.14.22  Patient Goals/Self-Care Activities Over the next 60 days, patient will:   - Self administers medications as prescribed -Attend all scheduled provider appointments -Review OTC catalog to determine if a BP cuff is offered -Work with embedded PharmD regarding desired diabetic testing supplies  -Contact SW as needed prior to next scheduled call

## 2020-11-24 ENCOUNTER — Other Ambulatory Visit: Payer: Self-pay

## 2020-11-24 ENCOUNTER — Telehealth: Payer: Medicare Other

## 2020-11-24 MED ORDER — FREESTYLE LIBRE 2 SENSOR MISC: 3 refills | Status: DC

## 2020-11-24 NOTE — Patient Instructions (Signed)
Visit Information  Goals Addressed              This Visit's Progress   .  Pharmacy Care Plan (pt-stated)        CARE PLAN ENTRY (see longitudinal plan of care for additional care plan information)  Current Barriers:  . Chronic Disease Management support, education, and care coordination needs related to Hypertension, Hyperlipidemia, and Diabetes   Hypertension BP Readings from Last 3 Encounters:  09/01/20 130/74  08/28/20 134/67  06/03/20 122/60   . Pharmacist Clinical Goal(s): o Over the next 90 days, patient will work with PharmD and providers to maintain BP goal <130/80 . Current regimen:  o Atenolol - Chlorthalidone 50-25 mg- take one tablet daily o Amlodipine - Benazepril 5 - 10 mg - take 1 capsule daily . Interventions: o Patient to take medication at the same time each day . Patient self care activities - Over the next 90 days, patient will: o Check BP 1 time per week, document, and provide at future appointments o Ensure daily salt intake < 2300 mg/day  Hyperlipidemia Lab Results  Component Value Date/Time   LDLCALC 103 (H) 06/01/2020 04:09 PM   LDLDIRECT 162.2 11/21/2013 11:35 AM   . Pharmacist Clinical Goal(s): o Over the next 90 days, patient will work with PharmD and providers to achieve LDL goal < 70 . Current regimen:  o Crestor 20 mg tablet take daily  . Interventions: o Spoke to patient about the importance of reducing fried and fatty foods.  o Replacing refined grain products with higher fiber and whole grain products when possible  o Using her exercise bike that she recently purchased  . Patient self care activities - Over the next 90 days, patient will: o Take medication everyday o Work out 30 minutes at least two times per week .   Diabetes Lab Results  Component Value Date/Time   HGBA1C 6.3 (H) 09/01/2020 03:20 PM   HGBA1C 6.7 (H) 05/06/2020 04:20 AM   . Pharmacist Clinical Goal(s): o Over the next 90 days, patient will work with PharmD  and providers to maintain A1c goal <7% . Current regimen:  o Trulicity 3.79 KW/4.0 mL - inject 0.75 mg every other week on Friday . Interventions: o Patient to drink at least 64 ounces of water per day  o Encouraged to check BS at least once a day  . Patient self care activities - Over the next 90 days, patient will: o Check blood sugar once daily, document, and provide at future appointments o Contact provider with any episodes of hypoglycemia   Medication management . Pharmacist Clinical Goal(s): o Over the next 90 days, patient will work with PharmD and providers to maintain optimal medication adherence . Current pharmacy: [facility]  . Interventions o Comprehensive medication review performed. o Continue current medication management strategy . Patient self care activities - Over the next 90 days, patient will: o Focus on medication adherence by using a pill reminder system  o Take medications as prescribed o Report any questions or concerns to PharmD and/or provider(s)  Initial goal documentation        [name] was given information about Chronic Care Management services today including:  1. CCM service includes personalized support from designated clinical staff supervised by her physician, including individualized plan of care and coordination with other care providers 2. 24/7 contact phone numbers for assistance for urgent and routine care needs. 3. Standard insurance, coinsurance, copays and deductibles apply for chronic care  management only during months in which we provide at least 20 minutes of these services. Most insurances cover these services at 100%, however patients may be responsible for any copay, coinsurance and/or deductible if applicable. This service may help you avoid the need for more expensive face-to-face services. 4. Only one practitioner may furnish and bill the service in a calendar month. 5. The patient may stop CCM services at any time  (effective at the end of the month) by phone call to the office staff.  Patient agreed to services and verbal consent obtained.   The patient verbalized understanding of instructions, educational materials, and care plan provided today and agreed to receive a mailed copy of patient instructions, educational materials, and care plan.  The pharmacy team will reach out to the patient again over the next 30 days.   Mayford Knife, [facility]

## 2020-12-02 ENCOUNTER — Other Ambulatory Visit: Payer: Self-pay | Admitting: Internal Medicine

## 2020-12-03 ENCOUNTER — Telehealth: Payer: Self-pay

## 2020-12-03 NOTE — Chronic Care Management (AMB) (Addendum)
Chronic Care Management Pharmacy Assistant   Name: [name]  MRN: [id number] DOB: 03/23/1941  Reason for Encounter: Diabetes Adherence Call  Patient Questions:  1.  Have you seen any other providers since your last visit? Yes, 11/23/20-[name], Cheryl (CCM).  2.  Any changes in your medicines or health? No    PCP : Dorothyann PengSanders, Robyn, MD  Allergies:   Allergies  Allergen Reactions   Atorvastatin Calcium Itching   Bee Venom Hives   Oxycodone Other (See Comments)    hallucinations   Vicodin [Hydrocodone-Acetaminophen] Other (See Comments)    hallucinations    Medications: Outpatient Encounter Medications as of 12/03/2020  Medication Sig Note   allopurinol (ZYLOPRIM) 300 MG tablet TAKE 1 TABLET(300 MG) BY MOUTH DAILY    amLODipine-benazepril (LOTREL) 5-10 MG capsule TAKE 1 CAPSULE BY MOUTH EVERY DAY    aspirin EC 81 MG tablet Take 1 tablet (81 mg total) by mouth daily.    atenolol-chlorthalidone (TENORETIC) 50-25 MG tablet TAKE 1 TABLET BY MOUTH EVERY DAY AT 11 AM    Cholecalciferol (VITAMIN D3) 1.25 MG (50000 UT) CAPS Take 1 capsule by mouth on Tuesday and Friday    Continuous Blood Gluc Sensor (FREESTYLE LIBRE 2 SENSOR) MISC Use to check blood sugar. Dx code e11.65    Dulaglutide (TRULICITY) 0.75 MG/0.5ML SOPN INJECT 0.5 ML(0.75 MG) SUBCUTANEOUSLY EVERY WEEK IN ABDOMEN, THIGH OR UPPER ARM ROTATING INJECTION SITE (Patient not taking: Reported on 11/04/2020) 11/04/2020: Per endocrinologist patient to take trulicity every other week.    Lancets (ONETOUCH DELICA PLUS LANCET33G) MISC USE AS DIRECTED TO CHECK BLOOD SUGAR TWICE DAILY    Magnesium 250 MG TABS Take 1 tablet (250 mg total) by mouth every evening. Take with dinner meal    Multiple Vitamins-Minerals (ZINC PO) Take 1 tablet by mouth daily.     ONETOUCH VERIO test strip USE AS DIRECTED TO TEST BLOOD SUGAR TWICE DAILY    Probiotic Product (PROBIOTIC PO) Take 1 capsule by mouth daily.     rosuvastatin (CRESTOR) 20 MG tablet TAKE  1 TABLET(20 MG) BY MOUTH DAILY    Vitamin D, Ergocalciferol, (DRISDOL) 1.25 MG (50000 UNIT) CAPS capsule TAKE 1 CAPSULE BY MOUTH ON TUESDAYS AND FRIDAYS    No facility-administered encounter medications on file as of 12/03/2020.    Current Diagnosis: Patient Active Problem List   Diagnosis Date Noted   Vitamin D deficiency 06/01/2020   CAP (community acquired pneumonia) 05/06/2020   Abdominal bloating 02/17/2019   Urinary frequency 02/17/2019   Uncontrolled type 2 diabetes mellitus with hyperglycemia ([facility]) 02/17/2019   Lightheaded 02/17/2019   Constipation 06/26/2018   Hyperlipidemia 08/23/2014   Type 2 diabetes mellitus with stage 3 chronic kidney disease, without long-term current use of insulin ([facility]) 06/19/2014   Mixed hyperlipidemia 06/19/2014   S/P urological surgery 09/30/2013   Joint pain 09/30/2013   UTI (urinary tract infection) 09/30/2013   Cough 12/10/2012   OA (osteoarthritis) of knee 01/05/2012   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 08/02/2011   Vaginal discharge 08/02/2011   Spasm of lumbar paraspinous muscle 06/17/2011   Morbidly obese ([facility]) 05/31/2011   HIP PAIN, LEFT, CHRONIC 11/09/2010   CARPAL TUNNEL SYNDROME, RIGHT 10/14/2010   KNEE PAIN, RIGHT, CHRONIC 10/14/2010   LEFT BUNDLE BRANCH BLOCK 07/26/2010   HELICOBACTER PYLORI GASTRITIS 07/18/2010   FATIGUE 07/01/2010   CHEST PAIN, ATYPICAL 07/01/2010   EPIGASTRIC PAIN 07/01/2010   SUPRAPUBIC PAIN 07/01/2010   HYPERGLYCEMIA 07/01/2010   CELLULITIS AND ABSCESS OF OTHER SPECIFIED  SITE 02/15/2010   LUMP OR MASS IN BREAST 01/10/2010   DYSURIA, CHRONIC 03/09/2009   GERD 10/06/2008   Personal history of colonic polyps-adenoma 08/26/2008   LOC OSTEOARTHROS NOT SPEC PRIM/SEC LOWER LEG 09/10/2007   GOUT, UNSPECIFIED 09/09/2007   DIVERTICULOSIS, COLON 09/09/2007   DIVERTICULITIS, HX OF 09/09/2007   HYPERLIPIDEMIA 06/03/2007   Essential hypertension 06/03/2007   Recent Relevant Labs: Lab Results  Component  Value Date/Time   HGBA1C 6.3 (H) 09/01/2020 03:20 PM   HGBA1C 6.7 (H) 05/06/2020 04:20 AM   MICROALBUR 10 03/19/2019 02:04 PM    Kidney Function Lab Results  Component Value Date/Time   CREATININE 1.21 (H) 08/28/2020 06:58 PM   CREATININE 1.12 (H) 06/01/2020 04:09 PM   GFR 53.18 (L) 06/19/2014 03:42 PM   GFRNONAA 46 (L) 08/28/2020 06:58 PM   GFRAA 54 (L) 06/01/2020 04:09 PM    Current antihyperglycemic regimen:  Trulicity 7.03 JK/0.9 mL - inject 0.75 mg every other week on Friday Dose changed by specialist during initial appointment  What recent interventions/DTPs have been made to improve glycemic control:  Recent interventions check blood sugar once daily, document, and provide at future appointments, Contact provider with any episodes of hypoglycemia. Patient stated she has cut back from drinking 10-11 glasses of water to now only drinking 8 glasses.  Have there been any recent hospitalizations or ED visits since last visit with CPP? No   Patient reports hypoglycemic symptoms, including  unbalanced .  Patient reports hyperglycemic symptoms, including blurry vision a day after from eating hotdogs, rice or pasta at night.   How often are you checking your blood sugar? Every other day and when she feels it is high.  What are your blood sugars ranging?  Fasting: 119 Before meals: none After meals: 124,127 Bedtime: none  During the week, how often does your blood glucose drop below 70?  Patient states it will drop often, and when it does she will stumble back and can't stop. Patient keeps crackers and candy at her desk.    Are you checking your feet daily/regularly? Patient states she checks both feet daily.  Adherence Review: Is the patient currently on a STATIN medication? Yes Is the patient currently on ACE/ARB medication? No Does the patient have >5 day gap between last estimated fill dates? No    Goals Addressed               This Visit's Progress      Pharmacy Care Plan (pt-stated)   Not on track     [place] (see longitudinal plan of care for additional care plan information)  Current Barriers:  Chronic Disease Management support, education, and care coordination needs related to Hypertension, Hyperlipidemia, and Diabetes   Hypertension BP Readings from Last 3 Encounters:  09/01/20 130/74  08/28/20 134/67  06/03/20 122/60   Pharmacist Clinical Goal(s): Over the next 90 days, patient will work with PharmD and providers to maintain BP goal <130/80 Current regimen:  Atenolol - Chlorthalidone 50-25 mg- take one tablet daily Amlodipine - Benazepril 5 - 10 mg - take 1 capsule daily Interventions: Patient to take medication at the same time each day Patient self care activities - Over the next 90 days, patient will: Check BP 1 time per week, document, and provide at future appointments Ensure daily salt intake < 2300 mg/day  Hyperlipidemia Lab Results  Component Value Date/Time   LDLCALC 103 (H) 06/01/2020 04:09 PM   LDLDIRECT 162.2 11/21/2013 11:35 AM   Pharmacist  Clinical Goal(s): Over the next 90 days, patient will work with PharmD and providers to achieve LDL goal < 70 Current regimen:  Crestor 20 mg tablet take daily  Interventions: Spoke to patient about the importance of reducing fried and fatty foods.  Replacing refined grain products with higher fiber and whole grain products when possible  Using her exercise bike that she recently purchased  Patient self care activities - Over the next 90 days, patient will: Take medication everyday Work out 30 minutes at least two times per week .   Diabetes Lab Results  Component Value Date/Time   HGBA1C 6.3 (H) 09/01/2020 03:20 PM   HGBA1C 6.7 (H) 05/06/2020 04:20 AM   Pharmacist Clinical Goal(s): Over the next 90 days, patient will work with PharmD and providers to maintain A1c goal <7% Current regimen:  Trulicity 1.01 BP/1.0 mL - inject 0.75 mg every other week on  Friday Interventions: Patient to drink at least 64 ounces of water per day  Encouraged to check BS at least once a day  Patient self care activities - Over the next 90 days, patient will: Check blood sugar once daily, document, and provide at future appointments Contact provider with any episodes of hypoglycemia   Medication management Pharmacist Clinical Goal(s): Over the next 90 days, patient will work with PharmD and providers to maintain optimal medication adherence Current pharmacy: [facility]  Interventions Comprehensive medication review performed. Continue current medication management strategy Patient self care activities - Over the next 90 days, patient will: Focus on medication adherence by using a pill reminder system  Take medications as prescribed Report any questions or concerns to PharmD and/or provider(s)  Initial goal documentation        12/03/20- Patient's husband stated the patient was with a customer and will have her return my call later.  Follow-Up:  Pharmacist Review-Patient states she would like someone to recommend her to better vitamins choices and seeking a person to speak with who can point her in the right direction in making healthier lifestyle choices. Patient voiced she will like to be assigned to a Education officer, museum. I did communicate with the patient she has a scheduled appointment through telephone with the social worker on 12/13/20 at 9:45 am. The patient verbalized understanding. The patient also was inquiring on how to obtain a glucose monitor patch; she is unsure if her insurance will cover for device but willing to pay out of pocket. I communicated with the patient I will relay this information to [name], CPP to better assist. The patient verbalized understanding.  The patient communicated with me she is interested in possibly utilizing [facility] and enrolling husband in CCM program.   [name], CPP Notified.  Raynelle Highland, [place] Pharmacist Assistant [phone] I have reviewed the care management and care coordination activities outlined in this encounter and I am certifying that I agree with the content of this note. No further action required.  2 minutes spent in review, coordination, and documentation.  Mayford Knife, [facility] 12/25/20 3:26 PM

## 2020-12-13 ENCOUNTER — Ambulatory Visit: Payer: Medicare Other

## 2020-12-13 DIAGNOSIS — N183 Chronic kidney disease, stage 3 unspecified: Secondary | ICD-10-CM

## 2020-12-13 DIAGNOSIS — I10 Essential (primary) hypertension: Secondary | ICD-10-CM

## 2020-12-13 DIAGNOSIS — E1122 Type 2 diabetes mellitus with diabetic chronic kidney disease: Secondary | ICD-10-CM

## 2020-12-13 NOTE — Patient Instructions (Signed)
Goals we discussed today:  Goals Addressed            This Visit's Progress   . Access over the counter catalog       Timeframe:  Short-Term Goal Priority:  High Start Date:  12.3.21                           Expected End Date:  2.12.22                     Next date of contact: 2.16.22  Patient Goals/Self-Care Activities Over the next 60 days, patient will:   - Self administers medications as prescribed -Attend all scheduled provider appointments -Review OTC catalog to determine if a BP cuff is offered -Work with embedded PharmD regarding desired diabetic testing supplies  -Contact SW as needed prior to next scheduled call

## 2020-12-13 NOTE — Chronic Care Management (AMB) (Signed)
Chronic Care Management    Social Work Note  12/13/2020 Name: [name] MRN: [id number] DOB: 1941/04/06  [name] is a 80 y.o. year old female who is a primary care patient of [name], MD. The CCM team was consulted to assist the patient with chronic disease management and/or care coordination needs related to: [employer] .   Engaged with patient by telephone for follow up visit in response to provider referral for social work chronic care management and care coordination services.   Consent to Services:  The patient was given information about Chronic Care Management services, agreed to services, and gave verbal consent prior to initiation of services.  Please see initial visit note for detailed documentation.   Patient agreed to services and consent obtained.   Assessment: Review of patient past medical history, allergies, medications, and health status, including review of relevant consultants reports was performed today as part of a comprehensive evaluation and provision of chronic care management and care coordination services.     SDOH (Social Determinants of Health) assessments and interventions performed:    Advanced Directives Status: Not addressed in this encounter.  CCM Care Plan  Allergies  Allergen Reactions  . Atorvastatin Calcium Itching  . Bee Venom Hives  . Oxycodone Other (See Comments)    hallucinations  . Vicodin [Hydrocodone-Acetaminophen] Other (See Comments)    hallucinations    Outpatient Encounter Medications as of 12/13/2020  Medication Sig Note  . allopurinol (ZYLOPRIM) 300 MG tablet TAKE 1 TABLET(300 MG) BY MOUTH DAILY   . amLODipine-benazepril (LOTREL) 5-10 MG capsule TAKE 1 CAPSULE BY MOUTH EVERY DAY   . aspirin EC 81 MG tablet Take 1 tablet (81 mg total) by mouth daily.   Marland Kitchen atenolol-chlorthalidone (TENORETIC) 50-25 MG tablet TAKE 1 TABLET BY MOUTH EVERY DAY AT 11 AM   . Cholecalciferol (VITAMIN D3) 1.25 MG (50000 UT) CAPS  Take 1 capsule by mouth on Tuesday and Friday   . Continuous Blood Gluc Sensor (FREESTYLE LIBRE 2 SENSOR) MISC Use to check blood sugar. Dx code e11.65   . Dulaglutide (TRULICITY) 9.67 RF/1.6BW SOPN INJECT 0.5 ML(0.75 MG) SUBCUTANEOUSLY EVERY WEEK IN ABDOMEN, THIGH OR UPPER ARM ROTATING INJECTION SITE (Patient not taking: Reported on 11/04/2020) 11/04/2020: Per endocrinologist patient to take trulicity every other week.   . Lancets (ONETOUCH DELICA PLUS [id number]) MISC USE AS DIRECTED TO CHECK BLOOD SUGAR TWICE DAILY   . Magnesium 250 MG TABS Take 1 tablet (250 mg total) by mouth every evening. Take with dinner meal   . Multiple Vitamins-Minerals (ZINC PO) Take 1 tablet by mouth daily.    [name] test strip USE AS DIRECTED TO TEST BLOOD SUGAR TWICE DAILY   . Probiotic Product (PROBIOTIC PO) Take 1 capsule by mouth daily.    . rosuvastatin (CRESTOR) 20 MG tablet TAKE 1 TABLET(20 MG) BY MOUTH DAILY   . Vitamin D, Ergocalciferol, (DRISDOL) 1.25 MG (50000 UNIT) CAPS capsule TAKE 1 CAPSULE BY MOUTH ON TUESDAYS AND FRIDAYS    No facility-administered encounter medications on file as of 12/13/2020.    Patient Active Problem List   Diagnosis Date Noted  . Vitamin D deficiency 06/01/2020  . CAP (community acquired pneumonia) 05/06/2020  . Abdominal bloating 02/17/2019  . Urinary frequency 02/17/2019  . Uncontrolled type 2 diabetes mellitus with hyperglycemia ([place]) 02/17/2019  . Lightheaded 02/17/2019  . Constipation 06/26/2018  . Hyperlipidemia 08/23/2014  . Type 2 diabetes mellitus with stage 3 chronic kidney disease, without long-term current use of  insulin ([place]) 06/19/2014  . Mixed hyperlipidemia 06/19/2014  . S/P urological surgery 09/30/2013  . Joint pain 09/30/2013  . UTI (urinary tract infection) 09/30/2013  . Cough 12/10/2012  . OA (osteoarthritis) of knee 01/05/2012  . 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 Vaginal discharge 08/02/2011  . Spasm of lumbar  paraspinous muscle 06/17/2011  . Morbidly obese ([place]) 05/31/2011  . HIP PAIN, LEFT, CHRONIC 11/09/2010  . CARPAL TUNNEL SYNDROME, RIGHT 10/14/2010  . KNEE PAIN, RIGHT, CHRONIC 10/14/2010  . LEFT BUNDLE BRANCH BLOCK 07/26/2010  . 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 FATIGUE 07/01/2010  . CHEST PAIN, ATYPICAL 07/01/2010  . EPIGASTRIC PAIN 07/01/2010  . SUPRAPUBIC PAIN 07/01/2010  . HYPERGLYCEMIA 07/01/2010  . CELLULITIS AND ABSCESS OF OTHER SPECIFIED SITE 02/15/2010  . LUMP OR MASS IN BREAST 01/10/2010  . DYSURIA, CHRONIC 03/09/2009  . GERD 10/06/2008  . Personal history of colonic polyps-adenoma 08/26/2008  . LOC OSTEOARTHROS NOT SPEC PRIM/SEC LOWER LEG 09/10/2007  . GOUT, UNSPECIFIED 09/09/2007  . DIVERTICULOSIS, COLON 09/09/2007  . DIVERTICULITIS, HX OF 09/09/2007  . HYPERLIPIDEMIA 06/03/2007  . Essential hypertension 06/03/2007    Conditions to be addressed/monitored: HTN; lacks knowledge of health plan benefits  Care Plan : Social Work Care Plan  Updates made by [name] since 12/13/2020 12:00 AM    Problem: Care Coordination     Goal: Access health plan benefits   Start Date: 10/01/2020  Expected End Date: 12/11/2020  Recent Progress: On track  Priority: High  Note:   Current Barriers:  . Financial constraints related to cost of incontinent supplies . Lacks knowledge of health plan benefit . Chronic conditions including DM II, HTN, and CKD III which put patient at increased risk of hospitalization  Social Work Clinical Goal(s):  Marland Kitchen Over the next 45 days the patient will work with SW to obtain incontinent supplies from over the counter benefit . Over the next 70 days the patient will work with SW to become more knowledgeable of health plan benefit  CCM SW Interventions: . 1:1 collaboration with [name], MD regarding development and update of comprehensive plan of care as evidenced by provider attestation and co-signature . Inter-disciplinary care  team collaboration (see longitudinal plan of care) . Outbound call placed to the patient to assess goal progression . Determined the patient attempted to pick up a blood pressure cuff from a pharmacy with a prescription and was informed they would not file insurance . Discussed the patient has concern surrounding dental benefits as well as DME and would like SW to assist her in calling her health plan to obtain benefit information . Patient unable to participate in call to health plan at this time . Scheduled follow up call for 2.16.22  Patient Goals/Self-Care Activities Over the next 60 days, patient will:   - Self administers medications as prescribed -Attend all scheduled provider appointments -Review OTC catalog to determine if a BP cuff is offered -Work with embedded PharmD regarding desired diabetic testing supplies  -Contact SW as needed prior to next scheduled call  Follow up Plan:  SW will contact the patient on 2.16.22       Follow Up Plan: SW will follow up with patient by phone over the next 3 days.      [name], BSW, CDP Social Worker, Certified Dementia Practitioner [place] [facility] [place] [facility] [phone]  Total time spent performing care coordination and/or care management activities with the patient by phone or face to face = 14  minutes.

## 2020-12-15 ENCOUNTER — Ambulatory Visit (INDEPENDENT_AMBULATORY_CARE_PROVIDER_SITE_OTHER): Payer: Medicare Other

## 2020-12-15 DIAGNOSIS — N183 Chronic kidney disease, stage 3 unspecified: Secondary | ICD-10-CM

## 2020-12-15 DIAGNOSIS — E1122 Type 2 diabetes mellitus with diabetic chronic kidney disease: Secondary | ICD-10-CM

## 2020-12-15 DIAGNOSIS — I10 Essential (primary) hypertension: Secondary | ICD-10-CM

## 2020-12-15 NOTE — Chronic Care Management (AMB) (Signed)
Chronic Care Management    Social Work Note  12/15/2020 Name: [name] MRN: [id number] DOB: 10/31/1940  [name] is a 80 y.o. year old female who is a primary care patient of [name], MD. The CCM team was consulted to assist the patient with chronic disease management and/or care coordination needs related to: [employer] .   Engaged with patient by telephone for follow up visit in response to provider referral for social work chronic care management and care coordination services.   Consent to Services:  The patient was given information about Chronic Care Management services, agreed to services, and gave verbal consent prior to initiation of services.  Please see initial visit note for detailed documentation.   Patient agreed to services and consent obtained.   Assessment: Review of patient past medical history, allergies, medications, and health status, including review of relevant consultants reports was performed today as part of a comprehensive evaluation and provision of chronic care management and care coordination services.     SDOH (Social Determinants of Health) assessments and interventions performed:    Advanced Directives Status: Not addressed in this encounter.  CCM Care Plan  Allergies  Allergen Reactions  . Atorvastatin Calcium Itching  . Bee Venom Hives  . Oxycodone Other (See Comments)    hallucinations  . Vicodin [Hydrocodone-Acetaminophen] Other (See Comments)    hallucinations    Outpatient Encounter Medications as of 12/15/2020  Medication Sig Note  . allopurinol (ZYLOPRIM) 300 MG tablet TAKE 1 TABLET(300 MG) BY MOUTH DAILY   . amLODipine-benazepril (LOTREL) 5-10 MG capsule TAKE 1 CAPSULE BY MOUTH EVERY DAY   . aspirin EC 81 MG tablet Take 1 tablet (81 mg total) by mouth daily.   Marland Kitchen atenolol-chlorthalidone (TENORETIC) 50-25 MG tablet TAKE 1 TABLET BY MOUTH EVERY DAY AT 11 AM   . Cholecalciferol (VITAMIN D3) 1.25 MG (50000 UT) CAPS  Take 1 capsule by mouth on Tuesday and Friday   . Continuous Blood Gluc Sensor (FREESTYLE LIBRE 2 SENSOR) MISC Use to check blood sugar. Dx code e11.65   . Dulaglutide (TRULICITY) 4.33 IR/5.1OA SOPN INJECT 0.5 ML(0.75 MG) SUBCUTANEOUSLY EVERY WEEK IN ABDOMEN, THIGH OR UPPER ARM ROTATING INJECTION SITE (Patient not taking: Reported on 11/04/2020) 11/04/2020: Per endocrinologist patient to take trulicity every other week.   . Lancets (ONETOUCH DELICA PLUS [id number]) MISC USE AS DIRECTED TO CHECK BLOOD SUGAR TWICE DAILY   . Magnesium 250 MG TABS Take 1 tablet (250 mg total) by mouth every evening. Take with dinner meal   . Multiple Vitamins-Minerals (ZINC PO) Take 1 tablet by mouth daily.    [name] test strip USE AS DIRECTED TO TEST BLOOD SUGAR TWICE DAILY   . Probiotic Product (PROBIOTIC PO) Take 1 capsule by mouth daily.    . rosuvastatin (CRESTOR) 20 MG tablet TAKE 1 TABLET(20 MG) BY MOUTH DAILY   . Vitamin D, Ergocalciferol, (DRISDOL) 1.25 MG (50000 UNIT) CAPS capsule TAKE 1 CAPSULE BY MOUTH ON TUESDAYS AND FRIDAYS    No facility-administered encounter medications on file as of 12/15/2020.    Patient Active Problem List   Diagnosis Date Noted  . Vitamin D deficiency 06/01/2020  . CAP (community acquired pneumonia) 05/06/2020  . Abdominal bloating 02/17/2019  . Urinary frequency 02/17/2019  . Uncontrolled type 2 diabetes mellitus with hyperglycemia ([place]) 02/17/2019  . Lightheaded 02/17/2019  . Constipation 06/26/2018  . Hyperlipidemia 08/23/2014  . Type 2 diabetes mellitus with stage 3 chronic kidney disease, without long-term current use of  insulin ([place]) 06/19/2014  . Mixed hyperlipidemia 06/19/2014  . S/P urological surgery 09/30/2013  . Joint pain 09/30/2013  . UTI (urinary tract infection) 09/30/2013  . Cough 12/10/2012  . OA (osteoarthritis) of knee 01/05/2012  . 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 Vaginal discharge 08/02/2011  . Spasm of lumbar  paraspinous muscle 06/17/2011  . Morbidly obese ([place]) 05/31/2011  . HIP PAIN, LEFT, CHRONIC 11/09/2010  . CARPAL TUNNEL SYNDROME, RIGHT 10/14/2010  . KNEE PAIN, RIGHT, CHRONIC 10/14/2010  . LEFT BUNDLE BRANCH BLOCK 07/26/2010  . 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 FATIGUE 07/01/2010  . CHEST PAIN, ATYPICAL 07/01/2010  . EPIGASTRIC PAIN 07/01/2010  . SUPRAPUBIC PAIN 07/01/2010  . HYPERGLYCEMIA 07/01/2010  . CELLULITIS AND ABSCESS OF OTHER SPECIFIED SITE 02/15/2010  . LUMP OR MASS IN BREAST 01/10/2010  . DYSURIA, CHRONIC 03/09/2009  . GERD 10/06/2008  . Personal history of colonic polyps-adenoma 08/26/2008  . LOC OSTEOARTHROS NOT SPEC PRIM/SEC LOWER LEG 09/10/2007  . GOUT, UNSPECIFIED 09/09/2007  . DIVERTICULOSIS, COLON 09/09/2007  . DIVERTICULITIS, HX OF 09/09/2007  . HYPERLIPIDEMIA 06/03/2007  . Essential hypertension 06/03/2007    Conditions to be addressed/monitored: HTN, DMII and CKD Stage III; care coordination  Care Plan : Social Work Care Plan  Updates made by [name] since 12/15/2020 12:00 AM    Problem: Care Coordination     Goal: Access health plan benefits   Start Date: 10/01/2020  Expected End Date: 12/11/2020  Recent Progress: On track  Priority: High  Note:   Current Barriers:  . Financial constraints related to cost of incontinent supplies . Lacks knowledge of health plan benefit . Chronic conditions including DM II, HTN, and CKD III which put patient at increased risk of hospitalization  Social Work Clinical Goal(s):  Marland Kitchen Over the next 45 days the patient will work with SW to obtain incontinent supplies from over the counter benefit . Over the next 70 days the patient will work with SW to become more knowledgeable of health plan benefit  CCM SW Interventions: . 1:1 collaboration with [name], MD regarding development and update of comprehensive plan of care as evidenced by provider attestation and co-signature . Inter-disciplinary care  team collaboration (see longitudinal plan of care) . Successful outbound call placed to the patient to assist with determining health plan benefits . Joint call placed to [facility] to obtain benefit information regarding dental coverage as well as if the health plan would cover a BP cuff . SW and patient on the phone for well over 10 minutes waiting for a representative  . Patient indicated she needed to end the call due to having customers, patient requested SW call back in 10 -15 minutes . SW placed an unsuccessful follow up call to the patient, voice message left requesting a return call  Patient Goals/Self-Care Activities Over the next 60 days, patient will:   - Self administers medications as prescribed -Attend all scheduled provider appointments -Work with embedded PharmD regarding desired diabetic testing supplies  -Contact SW as needed prior to next scheduled call  Follow up Plan:  SW will contact the patient on 3.1.22       Follow Up Plan: SW will follow up with patient by phone over the next 21 days.      [name], BSW, CDP Social Worker, Certified Dementia Practitioner [place] [facility] [place] [facility] [phone]  Total time spent performing care coordination and/or care management activities with the patient by phone or face to  face = 22 minutes.

## 2020-12-15 NOTE — Patient Instructions (Signed)
Goals we discussed today:  Goals Addressed            This Visit's Progress   . Access over the counter catalog       Timeframe:  Short-Term Goal Priority:  High Start Date:  12.3.21                           Expected End Date:  2.12.22                     Next date of contact: 3.1.22  Patient Goals/Self-Care Activities Over the next 60 days, patient will:   - Self administers medications as prescribed -Attend all scheduled provider appointments -Work with embedded PharmD regarding desired diabetic testing supplies  -Contact SW as needed prior to next scheduled call

## 2020-12-17 ENCOUNTER — Telehealth: Payer: Medicare Other

## 2020-12-18 ENCOUNTER — Other Ambulatory Visit: Payer: Self-pay | Admitting: Internal Medicine

## 2020-12-21 ENCOUNTER — Ambulatory Visit: Payer: Medicare Other

## 2020-12-21 DIAGNOSIS — E1122 Type 2 diabetes mellitus with diabetic chronic kidney disease: Secondary | ICD-10-CM | POA: Diagnosis not present

## 2020-12-21 DIAGNOSIS — I10 Essential (primary) hypertension: Secondary | ICD-10-CM

## 2020-12-21 DIAGNOSIS — N183 Chronic kidney disease, stage 3 unspecified: Secondary | ICD-10-CM

## 2020-12-21 NOTE — Patient Instructions (Signed)
  Goals we discussed today:  Goals Addressed            This Visit's Progress   . Understand health plan benefits       Timeframe:  Short-Term Goal Priority:  High Start Date:  12.3.21                           Expected End Date:  2.12.22                     Next date of contact: 3.1.22  Patient Goals/Self-Care Activities Over the next 60 days, patient will:   - Self administers medications as prescribed -Attend all scheduled provider appointments -Review provided list of in network dentists -Contact SW as needed prior to next scheduled call

## 2020-12-21 NOTE — Chronic Care Management (AMB) (Signed)
Chronic Care Management    Social Work Note  12/21/2020 Name: [name] MRN: [id number] DOB: 1941/08/24  [name] is a 80 y.o. year old female who is a primary care patient of [name], MD. The CCM team was consulted to assist the patient with chronic disease management and/or care coordination needs related to: understanding health plan benefits.   Engaged with patient by telephone for follow up visit in response to provider referral for social work chronic care management and care coordination services.   Consent to Services:  The patient was given information about Chronic Care Management services, agreed to services, and gave verbal consent prior to initiation of services.  Please see initial visit note for detailed documentation.   Patient agreed to services and consent obtained.   Assessment: Review of patient past medical history, allergies, medications, and health status, including review of relevant consultants reports was performed today as part of a comprehensive evaluation and provision of chronic care management and care coordination services.     SDOH (Social Determinants of Health) assessments and interventions performed:    Advanced Directives Status: Not addressed in this encounter.  CCM Care Plan  Allergies  Allergen Reactions  . Atorvastatin Calcium Itching  . Bee Venom Hives  . Oxycodone Other (See Comments)    hallucinations  . Vicodin [Hydrocodone-Acetaminophen] Other (See Comments)    hallucinations    Outpatient Encounter Medications as of 12/21/2020  Medication Sig Note  . allopurinol (ZYLOPRIM) 300 MG tablet TAKE 1 TABLET(300 MG) BY MOUTH DAILY   . amLODipine-benazepril (LOTREL) 5-10 MG capsule TAKE 1 CAPSULE BY MOUTH EVERY DAY   . aspirin EC 81 MG tablet Take 1 tablet (81 mg total) by mouth daily.   Marland Kitchen atenolol-chlorthalidone (TENORETIC) 50-25 MG tablet TAKE 1 TABLET BY MOUTH EVERY DAY AT 11 AM   . Cholecalciferol (VITAMIN D3) 1.25 MG  (50000 UT) CAPS Take 1 capsule by mouth on Tuesday and Friday   . Continuous Blood Gluc Sensor (FREESTYLE LIBRE 2 SENSOR) MISC Use to check blood sugar. Dx code e11.65   . Dulaglutide (TRULICITY) 1.94 RD/4.0CX SOPN INJECT 0.5 ML(0.75 MG) SUBCUTANEOUSLY EVERY WEEK IN ABDOMEN, THIGH OR UPPER ARM ROTATING INJECTION SITE (Patient not taking: Reported on 11/04/2020) 11/04/2020: Per endocrinologist patient to take trulicity every other week.   . Lancets (ONETOUCH DELICA PLUS [id number]) MISC USE AS DIRECTED TO CHECK BLOOD SUGAR TWICE DAILY   . Magnesium 250 MG TABS Take 1 tablet (250 mg total) by mouth every evening. Take with dinner meal   . Multiple Vitamins-Minerals (ZINC PO) Take 1 tablet by mouth daily.    [name] test strip USE TO TEST 2 TIMES DAILY   . Probiotic Product (PROBIOTIC PO) Take 1 capsule by mouth daily.    . rosuvastatin (CRESTOR) 20 MG tablet TAKE 1 TABLET(20 MG) BY MOUTH DAILY   . Vitamin D, Ergocalciferol, (DRISDOL) 1.25 MG (50000 UNIT) CAPS capsule TAKE 1 CAPSULE BY MOUTH ON TUESDAYS AND FRIDAYS    No facility-administered encounter medications on file as of 12/21/2020.    Patient Active Problem List   Diagnosis Date Noted  . Vitamin D deficiency 06/01/2020  . CAP (community acquired pneumonia) 05/06/2020  . Abdominal bloating 02/17/2019  . Urinary frequency 02/17/2019  . Uncontrolled type 2 diabetes mellitus with hyperglycemia ([place]) 02/17/2019  . Lightheaded 02/17/2019  . Constipation 06/26/2018  . Hyperlipidemia 08/23/2014  . Type 2 diabetes mellitus with stage 3 chronic kidney disease, without long-term current use of insulin ([place])  06/19/2014  . Mixed hyperlipidemia 06/19/2014  . S/P urological surgery 09/30/2013  . Joint pain 09/30/2013  . UTI (urinary tract infection) 09/30/2013  . Cough 12/10/2012  . OA (osteoarthritis) of knee 01/05/2012  . 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 Vaginal discharge 08/02/2011  . Spasm of lumbar paraspinous  muscle 06/17/2011  . Morbidly obese ([place]) 05/31/2011  . HIP PAIN, LEFT, CHRONIC 11/09/2010  . CARPAL TUNNEL SYNDROME, RIGHT 10/14/2010  . KNEE PAIN, RIGHT, CHRONIC 10/14/2010  . LEFT BUNDLE BRANCH BLOCK 07/26/2010  . 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 FATIGUE 07/01/2010  . CHEST PAIN, ATYPICAL 07/01/2010  . EPIGASTRIC PAIN 07/01/2010  . SUPRAPUBIC PAIN 07/01/2010  . HYPERGLYCEMIA 07/01/2010  . CELLULITIS AND ABSCESS OF OTHER SPECIFIED SITE 02/15/2010  . LUMP OR MASS IN BREAST 01/10/2010  . DYSURIA, CHRONIC 03/09/2009  . GERD 10/06/2008  . Personal history of colonic polyps-adenoma 08/26/2008  . LOC OSTEOARTHROS NOT SPEC PRIM/SEC LOWER LEG 09/10/2007  . GOUT, UNSPECIFIED 09/09/2007  . DIVERTICULOSIS, COLON 09/09/2007  . DIVERTICULITIS, HX OF 09/09/2007  . HYPERLIPIDEMIA 06/03/2007  . Essential hypertension 06/03/2007    Conditions to be addressed/monitored: HTN, DMII and CKD Stage III; limited knowledge of health plan benefits  Care Plan : Social Work Care Plan  Updates made by [name] since 12/21/2020 12:00 AM    Problem: Care Coordination     Goal: Access health plan benefits   Start Date: 10/01/2020  Expected End Date: 12/11/2020  This Visit's Progress: On track  Recent Progress: On track  Priority: High  Note:   Current Barriers:  . Financial constraints related to cost of incontinent supplies . Lacks knowledge of health plan benefit . Chronic conditions including DM II, HTN, and CKD III which put patient at increased risk of hospitalization  Social Work Clinical Goal(s):  Marland Kitchen Over the next 45 days the patient will work with SW to obtain incontinent supplies from over the counter benefit . Over the next 70 days the patient will work with SW to become more knowledgeable of health plan benefit  CCM SW Interventions: . 1:1 collaboration with [name], MD regarding development and update of comprehensive plan of care as evidenced by provider  attestation and co-signature . Inter-disciplinary care team collaboration (see longitudinal plan of care) . Successful outbound call placed to the patient to assist with determining health plan benefits . Joint call placed to [facility] to obtain benefit information . Dental Benefit: Determined the patients dental rider will be effective beginning December 28 2020. At this time the patient has basic dental coverage which covers routine cleanings, exams, and fluoride treatment. The patient will have access to more comprehensive coverage as of December 28 2020 . Patient requested a list of in network dental offices be e-mailed to her by [facility] representative . Advised the patient to review the list and select a dentist she would like to receive services from . Medical Benefits: Confirmed the patient continues to have the same medical coverage as the previous plan year with 80% covered by health plan and the patient being responsible for 20% . Blood Pressure Cuff Coverage: Informed by representative the patient may access her over the counter catalog to obtain a blood pressure cuff . Discussed the patient has obtained a BP cuff under OTC benefit in the past but the cuff was not large enough . Determined the patients health plan is contracted with [employer] for DME needs (Contact number [phone]) - the patient  will be responsible for 20% . Successful outbound call placed to [facility] to obtain fax number [phone]) . Collaboration with Dr. Baird Cancer to request a prescription for an automatic BP cuff be sent to [facility] . Scheduled follow up call to the patient over the next 10 days  Patient Goals/Self-Care Activities Over the next 10 days, patient will:   - Self administers medications as prescribed -Attend all scheduled provider appointments -Review provided list of in network dentists -Contact SW as needed prior to next scheduled call  Follow up  Plan:  SW will contact the patient on 3.1.22       Follow Up Plan: SW will follow up with patient by phone over the next 10 days.      [name], BSW, CDP Social Worker, Certified Dementia Practitioner [place] [facility] [place] [facility] [phone]  Total time spent performing care coordination and/or care management activities with the patient by phone or face to face = 63 minutes.

## 2020-12-23 ENCOUNTER — Other Ambulatory Visit: Payer: Self-pay | Admitting: Internal Medicine

## 2020-12-24 ENCOUNTER — Ambulatory Visit: Payer: Medicare Other

## 2020-12-24 DIAGNOSIS — E1122 Type 2 diabetes mellitus with diabetic chronic kidney disease: Secondary | ICD-10-CM

## 2020-12-24 DIAGNOSIS — I10 Essential (primary) hypertension: Secondary | ICD-10-CM

## 2020-12-24 DIAGNOSIS — N183 Chronic kidney disease, stage 3 unspecified: Secondary | ICD-10-CM

## 2020-12-24 NOTE — Patient Instructions (Signed)
   Goals we discussed today:  Goals Addressed            This Visit's Progress   . Understand health plan benefits       Timeframe:  Short-Term Goal Priority:  High Start Date:  12.3.21                           Expected End Date:  2.12.22                     Next date of contact: 3.1.22  Patient Goals/Self-Care Activities Over the next 60 days, patient will:   - Self administers medications as prescribed -Attend all scheduled provider appointments -Review provided list of in network dentists -Contact SW as needed prior to next scheduled call -Contact [employer] to order incontinent supplies

## 2020-12-24 NOTE — Chronic Care Management (AMB) (Signed)
Chronic Care Management    Social Work Note  12/24/2020 Name: [name] MRN: [id number] DOB: 12-16-1940  [name] is a 80 y.o. year old female who is a primary care patient of [name], MD. The CCM team was consulted to assist the patient with chronic disease management and/or care coordination needs related to: [employer] .   Engaged with patient by telephone for follow up visit in response to provider referral for social work chronic care management and care coordination services.   Consent to Services:  The patient was given information about Chronic Care Management services, agreed to services, and gave verbal consent prior to initiation of services.  Please see initial visit note for detailed documentation.   Patient agreed to services and consent obtained.   Assessment: Review of patient past medical history, allergies, medications, and health status, including review of relevant consultants reports was performed today as part of a comprehensive evaluation and provision of chronic care management and care coordination services.     SDOH (Social Determinants of Health) assessments and interventions performed:    Advanced Directives Status: Not addressed in this encounter.  CCM Care Plan  Allergies  Allergen Reactions  . Atorvastatin Calcium Itching  . Bee Venom Hives  . Oxycodone Other (See Comments)    hallucinations  . Vicodin [Hydrocodone-Acetaminophen] Other (See Comments)    hallucinations    Outpatient Encounter Medications as of 12/24/2020  Medication Sig Note  . allopurinol (ZYLOPRIM) 300 MG tablet TAKE 1 TABLET(300 MG) BY MOUTH DAILY   . amLODipine-benazepril (LOTREL) 5-10 MG capsule TAKE 1 CAPSULE BY MOUTH EVERY DAY   . aspirin EC 81 MG tablet Take 1 tablet (81 mg total) by mouth daily.   Marland Kitchen atenolol-chlorthalidone (TENORETIC) 50-25 MG tablet TAKE 1 TABLET BY MOUTH EVERY DAY AT 11 AM   . Cholecalciferol (VITAMIN D3) 1.25 MG (50000 UT) CAPS  Take 1 capsule by mouth on Tuesday and Friday   . Continuous Blood Gluc Sensor (FREESTYLE LIBRE 2 SENSOR) MISC Use to check blood sugar. Dx code e11.65   . Dulaglutide (TRULICITY) 6.75 FF/6.3WG SOPN INJECT 0.5 ML(0.75 MG) SUBCUTANEOUSLY EVERY WEEK IN ABDOMEN, THIGH OR UPPER ARM ROTATING INJECTION SITE (Patient not taking: Reported on 11/04/2020) 11/04/2020: Per endocrinologist patient to take trulicity every other week.   . Lancets (ONETOUCH DELICA PLUS [id number]) MISC USE TO CHECK BLOOD SUGAR TWICE DAILY   . Magnesium 250 MG TABS Take 1 tablet (250 mg total) by mouth every evening. Take with dinner meal   . Multiple Vitamins-Minerals (ZINC PO) Take 1 tablet by mouth daily.    [name] test strip USE TO TEST 2 TIMES DAILY   . Probiotic Product (PROBIOTIC PO) Take 1 capsule by mouth daily.    . rosuvastatin (CRESTOR) 20 MG tablet TAKE 1 TABLET(20 MG) BY MOUTH DAILY   . Vitamin D, Ergocalciferol, (DRISDOL) 1.25 MG (50000 UNIT) CAPS capsule TAKE 1 CAPSULE BY MOUTH ON TUESDAYS AND FRIDAYS    No facility-administered encounter medications on file as of 12/24/2020.    Patient Active Problem List   Diagnosis Date Noted  . Vitamin D deficiency 06/01/2020  . CAP (community acquired pneumonia) 05/06/2020  . Abdominal bloating 02/17/2019  . Urinary frequency 02/17/2019  . Uncontrolled type 2 diabetes mellitus with hyperglycemia ([place]) 02/17/2019  . Lightheaded 02/17/2019  . Constipation 06/26/2018  . Hyperlipidemia 08/23/2014  . Type 2 diabetes mellitus with stage 3 chronic kidney disease, without long-term current use of insulin ([place]) 06/19/2014  .  Mixed hyperlipidemia 06/19/2014  . S/P urological surgery 09/30/2013  . Joint pain 09/30/2013  . UTI (urinary tract infection) 09/30/2013  . Cough 12/10/2012  . OA (osteoarthritis) of knee 01/05/2012  . 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 Vaginal discharge 08/02/2011  . Spasm of lumbar paraspinous muscle 06/17/2011  . Morbidly  obese ([place]) 05/31/2011  . HIP PAIN, LEFT, CHRONIC 11/09/2010  . CARPAL TUNNEL SYNDROME, RIGHT 10/14/2010  . KNEE PAIN, RIGHT, CHRONIC 10/14/2010  . LEFT BUNDLE BRANCH BLOCK 07/26/2010  . 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 FATIGUE 07/01/2010  . CHEST PAIN, ATYPICAL 07/01/2010  . EPIGASTRIC PAIN 07/01/2010  . SUPRAPUBIC PAIN 07/01/2010  . HYPERGLYCEMIA 07/01/2010  . CELLULITIS AND ABSCESS OF OTHER SPECIFIED SITE 02/15/2010  . LUMP OR MASS IN BREAST 01/10/2010  . DYSURIA, CHRONIC 03/09/2009  . GERD 10/06/2008  . Personal history of colonic polyps-adenoma 08/26/2008  . LOC OSTEOARTHROS NOT SPEC PRIM/SEC LOWER LEG 09/10/2007  . GOUT, UNSPECIFIED 09/09/2007  . DIVERTICULOSIS, COLON 09/09/2007  . DIVERTICULITIS, HX OF 09/09/2007  . HYPERLIPIDEMIA 06/03/2007  . Essential hypertension 06/03/2007    Conditions to be addressed/monitored: HTN, DMII and CKD Stage III; care coordination  Care Plan : Social Work Care Plan  Updates made by [name] since 12/24/2020 12:00 AM    Problem: Care Coordination     Goal: Access health plan benefits   Start Date: 10/01/2020  Expected End Date: 12/11/2020  Recent Progress: On track  Priority: High  Note:   Current Barriers:  . Financial constraints related to cost of incontinent supplies . Lacks knowledge of health plan benefit . Chronic conditions including DM II, HTN, and CKD III which put patient at increased risk of hospitalization  Social Work Clinical Goal(s):  Marland Kitchen Over the next 45 days the patient will work with SW to obtain incontinent supplies from over the counter benefit . Over the next 70 days the patient will work with SW to become more knowledgeable of health plan benefit  CCM SW Interventions: . 1:1 collaboration with [name], MD regarding development and update of comprehensive plan of care as evidenced by provider attestation and co-signature . Inter-disciplinary care team collaboration (see longitudinal plan  of care) . Successful outbound call placed to the patient in response to a voice message received requesting SW assistance . Determined the patient attempted to order incontinent supplies through her over the counter benefit and was met with barriers . Provided the patient with contact number to [facility] vendor ([employer]) as well as product number for XL briefs . Advised the patient to contact SW as needed for assistance  Patient Goals/Self-Care Activities Over the next 10 days, patient will:   - Self administers medications as prescribed -Attend all scheduled provider appointments -Review provided list of in network dentists -Contact SW as needed prior to next scheduled call -Contact [employer] to order incontinent supplies  Follow up Plan:  SW will contact the patient on 3.1.22       Follow Up Plan: SW will follow up with patient by phone over the next week      [name], BSW, CDP Social Worker, Certified Dementia Practitioner [facility] [facility] [phone]  Total time spent performing care coordination and/or care management activities with the patient by phone or face to face = 16 minutes.

## 2020-12-27 ENCOUNTER — Telehealth: Payer: Self-pay

## 2020-12-27 NOTE — Chronic Care Management (AMB) (Signed)
Chronic Care Management Pharmacy Assistant   Name: [name]  MRN: [id number] DOB: Nov 17, 1940  Reason for Encounter: Medication Review   PCP : [name], MD  Allergies:   Allergies  Allergen Reactions  . Atorvastatin Calcium Itching  . Bee Venom Hives  . Oxycodone Other (See Comments)    hallucinations  . Vicodin [Hydrocodone-Acetaminophen] Other (See Comments)    hallucinations    Medications: Outpatient Encounter Medications as of 12/27/2020  Medication Sig Note  . allopurinol (ZYLOPRIM) 300 MG tablet TAKE 1 TABLET(300 MG) BY MOUTH DAILY   . amLODipine-benazepril (LOTREL) 5-10 MG capsule TAKE 1 CAPSULE BY MOUTH EVERY DAY   . aspirin EC 81 MG tablet Take 1 tablet (81 mg total) by mouth daily.   Marland Kitchen atenolol-chlorthalidone (TENORETIC) 50-25 MG tablet TAKE 1 TABLET BY MOUTH EVERY DAY AT 11 AM   . Cholecalciferol (VITAMIN D3) 1.25 MG (50000 UT) CAPS Take 1 capsule by mouth on Tuesday and Friday   . Continuous Blood Gluc Sensor (FREESTYLE LIBRE 2 SENSOR) MISC Use to check blood sugar. Dx code e11.65   . Dulaglutide (TRULICITY) 6.94 WN/4.6EV SOPN INJECT 0.5 ML(0.75 MG) SUBCUTANEOUSLY EVERY WEEK IN ABDOMEN, THIGH OR UPPER ARM ROTATING INJECTION SITE (Patient not taking: Reported on 11/04/2020) 11/04/2020: Per endocrinologist patient to take trulicity every other week.   . Lancets (ONETOUCH DELICA PLUS [id number]) MISC USE TO CHECK BLOOD SUGAR TWICE DAILY   . Magnesium 250 MG TABS Take 1 tablet (250 mg total) by mouth every evening. Take with dinner meal   . Multiple Vitamins-Minerals (ZINC PO) Take 1 tablet by mouth daily.    [name] test strip USE TO TEST 2 TIMES DAILY   . Probiotic Product (PROBIOTIC PO) Take 1 capsule by mouth daily.    . rosuvastatin (CRESTOR) 20 MG tablet TAKE 1 TABLET(20 MG) BY MOUTH DAILY   . Vitamin D, Ergocalciferol, (DRISDOL) 1.25 MG (50000 UNIT) CAPS capsule TAKE 1 CAPSULE BY MOUTH ON TUESDAYS AND FRIDAYS    No facility-administered  encounter medications on file as of 12/27/2020.    Current Diagnosis: Patient Active Problem List   Diagnosis Date Noted  . Vitamin D deficiency 06/01/2020  . CAP (community acquired pneumonia) 05/06/2020  . Abdominal bloating 02/17/2019  . Urinary frequency 02/17/2019  . Uncontrolled type 2 diabetes mellitus with hyperglycemia ([place]) 02/17/2019  . Lightheaded 02/17/2019  . Constipation 06/26/2018  . Hyperlipidemia 08/23/2014  . Type 2 diabetes mellitus with stage 3 chronic kidney disease, without long-term current use of insulin ([place]) 06/19/2014  . Mixed hyperlipidemia 06/19/2014  . S/P urological surgery 09/30/2013  . Joint pain 09/30/2013  . UTI (urinary tract infection) 09/30/2013  . Cough 12/10/2012  . OA (osteoarthritis) of knee 01/05/2012  . 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 Vaginal discharge 08/02/2011  . Spasm of lumbar paraspinous muscle 06/17/2011  . Morbidly obese ([place]) 05/31/2011  . HIP PAIN, LEFT, CHRONIC 11/09/2010  . CARPAL TUNNEL SYNDROME, RIGHT 10/14/2010  . KNEE PAIN, RIGHT, CHRONIC 10/14/2010  . LEFT BUNDLE BRANCH BLOCK 07/26/2010  . 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 FATIGUE 07/01/2010  . CHEST PAIN, ATYPICAL 07/01/2010  . EPIGASTRIC PAIN 07/01/2010  . SUPRAPUBIC PAIN 07/01/2010  . HYPERGLYCEMIA 07/01/2010  . CELLULITIS AND ABSCESS OF OTHER SPECIFIED SITE 02/15/2010  . LUMP OR MASS IN BREAST 01/10/2010  . DYSURIA, CHRONIC 03/09/2009  . GERD 10/06/2008  . Personal history of colonic polyps-adenoma 08/26/2008  . LOC OSTEOARTHROS NOT SPEC PRIM/SEC LOWER LEG 09/10/2007  .  GOUT, UNSPECIFIED 09/09/2007  . DIVERTICULOSIS, COLON 09/09/2007  . DIVERTICULITIS, HX OF 09/09/2007  . HYPERLIPIDEMIA 06/03/2007  . Essential hypertension 06/03/2007     Follow-Up:  Pharmacist Review-Reviewed chart and adherence measures. Per insurance data medication adherence for Cholesterol-Met-Med Compliance 80-89% / Medication Adherence for  Hypertension-Met-Med Compliance 80-89% / Controlling High Blood Pressure-Met-BP < 140/90.  [name], CPP Notified.  Raynelle Highland, [place] Pharmacist Assistant [phone] CCM Total Time: 6 minutes

## 2020-12-28 ENCOUNTER — Ambulatory Visit: Payer: Medicare Other

## 2020-12-28 DIAGNOSIS — I10 Essential (primary) hypertension: Secondary | ICD-10-CM

## 2020-12-28 DIAGNOSIS — E1122 Type 2 diabetes mellitus with diabetic chronic kidney disease: Secondary | ICD-10-CM

## 2020-12-28 DIAGNOSIS — N183 Chronic kidney disease, stage 3 unspecified: Secondary | ICD-10-CM

## 2020-12-28 NOTE — Patient Instructions (Signed)
   Goals we discussed today:  Goals Addressed            This Visit's Progress   . Understand health plan benefits       Timeframe:  Short-Term Goal Priority:  High Start Date:  12.3.21                           Expected End Date:  2.12.22                     Next date of contact: 3.15.22  Patient Goals/Self-Care Activities Over the next 60 days, patient will:   - Self administers medications as prescribed -Attend all scheduled provider appointments -Review provided list of in network dentists -Contact SW as needed prior to next scheduled call

## 2020-12-28 NOTE — Chronic Care Management (AMB) (Signed)
Chronic Care Management    Social Work Note  12/28/2020 Name: [name] MRN: [id number] DOB: [birth year]  [name] is a 80 y.o. year old female who is a primary care patient of [name], MD. The CCM team was consulted to assist the patient with chronic disease management and/or care coordination needs related to: [employer] .   Engaged with patient by telephone for follow up visit in response to provider referral for social work chronic care management and care coordination services.   Consent to Services:  The patient was given information about Chronic Care Management services, agreed to services, and gave verbal consent prior to initiation of services.  Please see initial visit note for detailed documentation.   Patient agreed to services and consent obtained.   Assessment: Review of patient past medical history, allergies, medications, and health status, including review of relevant consultants reports was performed today as part of a comprehensive evaluation and provision of chronic care management and care coordination services.     SDOH (Social Determinants of Health) assessments and interventions performed:    Advanced Directives Status: Not addressed in this encounter.  CCM Care Plan  Allergies  Allergen Reactions  . Atorvastatin Calcium Itching  . Bee Venom Hives  . Oxycodone Other (See Comments)    hallucinations  . Vicodin [Hydrocodone-Acetaminophen] Other (See Comments)    hallucinations    Outpatient Encounter Medications as of 12/28/2020  Medication Sig Note  . allopurinol (ZYLOPRIM) 300 MG tablet TAKE 1 TABLET(300 MG) BY MOUTH DAILY   . amLODipine-benazepril (LOTREL) 5-10 MG capsule TAKE 1 CAPSULE BY MOUTH EVERY DAY   . aspirin EC 81 MG tablet Take 1 tablet (81 mg total) by mouth daily.   Marland Kitchen atenolol-chlorthalidone (TENORETIC) 50-25 MG tablet TAKE 1 TABLET BY MOUTH EVERY DAY AT 11 AM   . Cholecalciferol (VITAMIN D3) 1.25 MG (50000 UT) CAPS  Take 1 capsule by mouth on Tuesday and Friday   . Continuous Blood Gluc Sensor (FREESTYLE LIBRE 2 SENSOR) MISC Use to check blood sugar. Dx code e11.65   . Dulaglutide (TRULICITY) 7.34 LP/3.7TK SOPN INJECT 0.5 ML(0.75 MG) SUBCUTANEOUSLY EVERY WEEK IN ABDOMEN, THIGH OR UPPER ARM ROTATING INJECTION SITE (Patient not taking: Reported on 11/04/2020) 11/04/2020: Per endocrinologist patient to take trulicity every other week.   . Lancets (ONETOUCH DELICA PLUS [id number]) MISC USE TO CHECK BLOOD SUGAR TWICE DAILY   . Magnesium 250 MG TABS Take 1 tablet (250 mg total) by mouth every evening. Take with dinner meal   . Multiple Vitamins-Minerals (ZINC PO) Take 1 tablet by mouth daily.    [name] test strip USE TO TEST 2 TIMES DAILY   . Probiotic Product (PROBIOTIC PO) Take 1 capsule by mouth daily.    . rosuvastatin (CRESTOR) 20 MG tablet TAKE 1 TABLET(20 MG) BY MOUTH DAILY   . Vitamin D, Ergocalciferol, (DRISDOL) 1.25 MG (50000 UNIT) CAPS capsule TAKE 1 CAPSULE BY MOUTH ON TUESDAYS AND FRIDAYS    No facility-administered encounter medications on file as of 12/28/2020.    Patient Active Problem List   Diagnosis Date Noted  . Vitamin D deficiency 06/01/2020  . CAP (community acquired pneumonia) 05/06/2020  . Abdominal bloating 02/17/2019  . Urinary frequency 02/17/2019  . Uncontrolled type 2 diabetes mellitus with hyperglycemia ([place]) 02/17/2019  . Lightheaded 02/17/2019  . Constipation 06/26/2018  . Hyperlipidemia 08/23/2014  . Type 2 diabetes mellitus with stage 3 chronic kidney disease, without long-term current use of insulin ([place]) 06/19/2014  .  Mixed hyperlipidemia 06/19/2014  . S/P urological surgery 09/30/2013  . Joint pain 09/30/2013  . UTI (urinary tract infection) 09/30/2013  . Cough 12/10/2012  . OA (osteoarthritis) of knee 01/05/2012  . 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 Vaginal discharge 08/02/2011  . Spasm of lumbar paraspinous muscle 06/17/2011  . Morbidly  obese ([place]) 05/31/2011  . HIP PAIN, LEFT, CHRONIC 11/09/2010  . CARPAL TUNNEL SYNDROME, RIGHT 10/14/2010  . KNEE PAIN, RIGHT, CHRONIC 10/14/2010  . LEFT BUNDLE BRANCH BLOCK 07/26/2010  . 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 FATIGUE 07/01/2010  . CHEST PAIN, ATYPICAL 07/01/2010  . EPIGASTRIC PAIN 07/01/2010  . SUPRAPUBIC PAIN 07/01/2010  . HYPERGLYCEMIA 07/01/2010  . CELLULITIS AND ABSCESS OF OTHER SPECIFIED SITE 02/15/2010  . LUMP OR MASS IN BREAST 01/10/2010  . DYSURIA, CHRONIC 03/09/2009  . GERD 10/06/2008  . Personal history of colonic polyps-adenoma 08/26/2008  . LOC OSTEOARTHROS NOT SPEC PRIM/SEC LOWER LEG 09/10/2007  . GOUT, UNSPECIFIED 09/09/2007  . DIVERTICULOSIS, COLON 09/09/2007  . DIVERTICULITIS, HX OF 09/09/2007  . HYPERLIPIDEMIA 06/03/2007  . Essential hypertension 06/03/2007    Conditions to be addressed/monitored: HTN and DMII; care coordination  Care Plan : Social Work Care Plan  Updates made by [name] since 12/28/2020 12:00 AM    Problem: Care Coordination     Goal: Access health plan benefits   Start Date: 10/01/2020  Expected End Date: 12/11/2020  Recent Progress: On track  Priority: High  Note:   Current Barriers:  . Financial constraints related to cost of incontinent supplies . Lacks knowledge of health plan benefit . Chronic conditions including DM II, HTN, and CKD III which put patient at increased risk of hospitalization  Social Work Clinical Goal(s):  Marland Kitchen Over the next 45 days the patient will work with SW to obtain incontinent supplies from over the counter benefit . Over the next 70 days the patient will work with SW to become more knowledgeable of health plan benefit  CCM SW Interventions: . 1:1 collaboration with [name], MD regarding development and update of comprehensive plan of care as evidenced by provider attestation and co-signature . Inter-disciplinary care team collaboration (see longitudinal plan of  care) . Successful outbound call placed to the patient to assess goal progression . Determined the patient has yet to receive contact from [facility] regarding BP cuff . Advised the patient SW would request she be measure for BP cuff at [facility] scheduled for 3.3.22 . Collaboration with primary provider office to request patient be measured for BP cuff with prescription be faxed to [facility] at [phone] . Scheduled follow up call over the next two weeks  Patient Goals/Self-Care Activities Over the next 14 days, patient will:   - Self administers medications as prescribed -Attend all scheduled provider appointments -Review provided list of in network dentists -Contact SW as needed prior to next scheduled call   Follow up Plan:  SW will contact the patient on 3.15.22       Follow Up Plan: SW will follow up with patient by phone over the next two weeks.      [name], BSW, CDP Social Worker, Certified Dementia Practitioner [place] [facility] [phone]  Total time spent performing care coordination and/or care management activities with the patient by phone or face to face = 15 minutes.

## 2020-12-30 ENCOUNTER — Other Ambulatory Visit: Payer: Self-pay

## 2020-12-30 ENCOUNTER — Ambulatory Visit (INDEPENDENT_AMBULATORY_CARE_PROVIDER_SITE_OTHER): Payer: Medicare Other | Admitting: Internal Medicine

## 2020-12-30 ENCOUNTER — Encounter: Payer: Self-pay | Admitting: Internal Medicine

## 2020-12-30 VITALS — BP 144/80 | HR 58 | Temp 97.6°F | Ht 65.0 in | Wt 238.0 lb

## 2020-12-30 DIAGNOSIS — E1122 Type 2 diabetes mellitus with diabetic chronic kidney disease: Secondary | ICD-10-CM | POA: Diagnosis not present

## 2020-12-30 DIAGNOSIS — I129 Hypertensive chronic kidney disease with stage 1 through stage 4 chronic kidney disease, or unspecified chronic kidney disease: Secondary | ICD-10-CM

## 2020-12-30 DIAGNOSIS — Z6839 Body mass index (BMI) 39.0-39.9, adult: Secondary | ICD-10-CM

## 2020-12-30 DIAGNOSIS — L299 Pruritus, unspecified: Secondary | ICD-10-CM

## 2020-12-30 DIAGNOSIS — N183 Chronic kidney disease, stage 3 unspecified: Secondary | ICD-10-CM | POA: Diagnosis not present

## 2020-12-30 DIAGNOSIS — I700 Atherosclerosis of aorta: Secondary | ICD-10-CM

## 2020-12-30 DIAGNOSIS — E785 Hyperlipidemia, unspecified: Secondary | ICD-10-CM

## 2020-12-30 MED ORDER — HYDROXYZINE HCL 10 MG PO TABS: 10.0000 mg | ORAL_TABLET | Freq: Three times a day (TID) | ORAL | 0 refills | Status: DC | PRN

## 2020-12-30 NOTE — Progress Notes (Signed)
I,Katawbba Wiggins,acting as a Education administrator for Maximino Greenland, MD.,have documented all relevant documentation on the behalf of Maximino Greenland, MD,as directed by  Maximino Greenland, MD while in the presence of Maximino Greenland, MD.  This visit occurred during the SARS-CoV-2 public health emergency.  Safety protocols were in place, including screening questions prior to the visit, additional usage of staff PPE, and extensive cleaning of exam room while observing appropriate contact time as indicated for disinfecting solutions.  Subjective:     Patient ID: [name] , female    DOB: 1941-08-23 , 80 y.o.   MRN: [id number]   Chief Complaint  Patient presents with  . Diabetes  . Hypertension    HPI  The patient is here for a diabetes and blood pressure follow-up. She reports compliance with meds. She denies headaches, chest pain and shortness of breath. She wants rx for bp cuff. States her insurance would pay for this if sent to specific medical supply company.  Diabetes She presents for her follow-up diabetic visit. She has type 2 diabetes mellitus. There are no hypoglycemic associated symptoms. Pertinent negatives for diabetes include no blurred vision and no chest pain. There are no hypoglycemic complications. Risk factors for coronary artery disease include diabetes mellitus, dyslipidemia, hypertension, obesity, sedentary lifestyle and post-menopausal. She is compliant with treatment most of the time. She participates in exercise intermittently. An ACE inhibitor/angiotensin II receptor blocker is being taken. Eye exam is current.  Hypertension This is a chronic problem. The current episode started more than 1 year ago. The problem has been gradually improving since onset. The problem is controlled. Pertinent negatives include no blurred vision or chest pain. Risk factors for coronary artery disease include diabetes mellitus, dyslipidemia, obesity, post-menopausal state and sedentary lifestyle.      Past Medical History:  Diagnosis Date  . Abdominal pain   . Arthritis    cervical disc degeneration/ oa left knee, carpal tunnel rt wrist, adhesive capsulitis right shoulder, rt hand weakness; lumbar degeneration  . Carpal tunnel syndrome   . 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 Constipation   . Diabetes mellitus without complication ([place])    borderline - diet control  . Diverticulitis hx of  . Diverticulosis   . E. coli infection    2020  . Frequent UTI    hx of urethral injury during colon surgery - states frequent uti's since  . GERD (gastroesophageal reflux disease)   . H/O hiatal hernia   . History of palpitations    in the past  . History of shingles    has a lingering itching on back where shingles were  . Hyperlipidemia   . Hypertension   . Numbness and tingling in right hand    pt. states has numbness of right hand very frequently-watch positioning  . Obesity   . Osteoarthritis   . Osteoporosis   . Pain    pain left knee and pain right hip and right groin  . Personal history of colonic polyps-adenoma 08/26/2008  . Pneumonia 2005  . Vitamin D deficiency      Family History  Problem Relation Age of Onset  . Breast cancer Mother 46  . Hypertension Mother   . Prostate cancer Father   . Hypertension Father   . Dementia Sister   .  Lung cancer Brother   . Hypertension Brother   . COPD Brother   . Cancer Maternal Grandmother   . Arthritis Sister   . Hypertension Sister   . Arthritis Sister   . Hypertension Child   . Hypertension Child   . Hypertension Child   . Colon polyps Neg Hx   . Esophageal cancer Neg Hx   . Rectal cancer Neg Hx   . Stomach cancer Neg Hx   . Colon cancer Neg Hx      Current Outpatient Medications:  .  allopurinol (ZYLOPRIM) 300 MG tablet, TAKE 1  TABLET(300 MG) BY MOUTH DAILY, Disp: 90 tablet, Rfl: 0 .  aspirin EC 81 MG tablet, Take 1 tablet (81 mg total) by mouth daily., Disp: 90 tablet, Rfl: 0 .  atenolol-chlorthalidone (TENORETIC) 50-25 MG tablet, TAKE 1 TABLET BY MOUTH EVERY DAY AT 11 AM, Disp: 90 tablet, Rfl: 0 .  benazepril (LOTENSIN) 10 MG tablet, Take 10 mg by mouth daily., Disp: , Rfl:  .  Cholecalciferol (VITAMIN D3) 1.25 MG (50000 UT) CAPS, Take 1 capsule by mouth on Tuesday and Friday, Disp: 24 capsule, Rfl: 1 .  Cyanocobalamin (VITAMIN B12 PO), Take by mouth., Disp: , Rfl:  .  Dulaglutide (TRULICITY) 9.92 EQ/6.8TM SOPN, INJECT 0.5 ML(0.75 MG) SUBCUTANEOUSLY EVERY WEEK IN ABDOMEN, THIGH OR UPPER ARM ROTATING INJECTION SITE, Disp: 2 mL, Rfl: 0 .  hydrOXYzine (ATARAX/VISTARIL) 10 MG tablet, Take 1 tablet (10 mg total) by mouth 3 (three) times daily as needed., Disp: 30 tablet, Rfl: 0 .  Lancets (ONETOUCH DELICA PLUS [id number]) MISC, USE TO CHECK BLOOD SUGAR TWICE DAILY, Disp: 100 each, Rfl: 3 .  Magnesium 250 MG TABS, Take 1 tablet (250 mg total) by mouth every evening. Take with dinner meal, Disp: 90 tablet, Rfl: 1 .  Multiple Vitamins-Minerals (ZINC PO), Take 1 tablet by mouth daily. , Disp: , Rfl:  .  ONETOUCH VERIO test strip, USE TO TEST 2 TIMES DAILY, Disp: 100 strip, Rfl: 3 .  Probiotic Product (PROBIOTIC PO), Take 1 capsule by mouth daily. , Disp: , Rfl:  .  rosuvastatin (CRESTOR) 20 MG tablet, TAKE 1 TABLET(20 MG) BY MOUTH DAILY, Disp: 90 tablet, Rfl: 0 .  Continuous Blood Gluc Sensor (FREESTYLE LIBRE 2 SENSOR) MISC, Use to check blood sugar. Dx code e11.65 (Patient not taking: Reported on 12/30/2020), Disp: 3 each, Rfl: 3   Allergies  Allergen Reactions  . Atorvastatin Calcium Itching  . Bee Venom Hives  . Oxycodone Other (See Comments)    hallucinations  . Vicodin [Hydrocodone-Acetaminophen] Other (See Comments)    hallucinations     Review of Systems  Constitutional: Negative.   Eyes: Negative for blurred  vision.  Respiratory: Negative.   Cardiovascular: Negative.  Negative for chest pain.  Gastrointestinal: Negative.   Skin:       She c/o itching. Not sure what triggers her sx. Not sure what she is allergic to. Not sure if there is a rash, mostly itches on her back.   Psychiatric/Behavioral: Negative.   All other systems reviewed and are negative.    Today's Vitals   12/30/20 0937  BP: (!) 144/80  Pulse: (!) 58  Temp: 97.6 F (36.4 C)  TempSrc: Oral  Weight: 238 lb (108 kg)  Height: '5\' 5"'  (1.651 m)  PainSc: 0-No pain   Body mass index is 39.61 kg/m.  Wt Readings from Last 3 Encounters:  12/30/20 238 lb (108 kg)  09/16/20 243 lb 9.6 oz (110.5 kg)  09/01/20 240 lb 9.6 oz (109.1 kg)   Objective:  Physical Exam Vitals and nursing note reviewed.  Constitutional:      Appearance: Normal appearance. She is obese.  HENT:     Head: Normocephalic and atraumatic.     Nose:     Comments: Masked     Mouth/Throat:     Comments: Masked  Cardiovascular:     Rate and Rhythm: Normal rate and regular rhythm.     Heart sounds: Normal heart sounds.  Pulmonary:     Breath sounds: Normal breath sounds.  Musculoskeletal:     Cervical back: Normal range of motion.  Skin:    General: Skin is warm.     Comments: No rash on back  Neurological:     General: No focal deficit present.     Mental Status: She is alert and oriented to person, place, and time.         Assessment And Plan:     1. Type 2 diabetes mellitus with stage 3 chronic kidney disease, without long-term current use of insulin, unspecified whether stage 3a or 3b CKD ([place]) Comments: Chronic, I will check labs as listed below.  Importance of dietary compliance was d/w the patient. She will f/u in 4 months. Will adjust meds if needed.  - Hemoglobin A1c - CMP14+EGFR  2. Hypertensive nephropathy Comments: Chronic, uncontrolled. Importance of salt restriction was d/w pt. Encouraged her to look at ALL seasonings she uses to  ensure its not listed.   3. Pruritus Comments: I will refer her for formal allergy testing. She is in agreement w/ referral. Will send rx hydroxyzine to use prn.  - Ambulatory referral to Allergy - CBC with Diff  4. Aortic atherosclerosis (HCC) Comments: Chronic. Advised to follow heart healthy lifestyle. Advised to avoid fried foods, increase exercise, comply w/ statin use and increase fiber intake.  5. Class 2 severe obesity due to excess calories with serious comorbidity and body mass index (BMI) of 39.0 to 39.9 in adult [facility]) She is encouraged to strive for BMI less than 30 to decrease cardiac risk. Advised to aim for at least 150 minutes of exercise per week.    Patient was given opportunity to ask questions. Patient verbalized understanding of the plan and was able to repeat key elements of the plan. All questions were answered to their satisfaction.   I, Maximino Greenland, MD, have reviewed all documentation for this visit. The documentation on 12/30/20 for the exam, diagnosis, procedures, and orders are all accurate and complete.  THE PATIENT IS ENCOURAGED TO PRACTICE SOCIAL DISTANCING DUE TO THE COVID-19 PANDEMIC.

## 2020-12-30 NOTE — Patient Instructions (Signed)

## 2020-12-31 LAB — CMP14+EGFR
ALT: 31 IU/L (ref 0–32)
AST: 37 IU/L (ref 0–40)
Albumin/Globulin Ratio: 1.5 (ref 1.2–2.2)
Albumin: 4 g/dL (ref 3.7–4.7)
Alkaline Phosphatase: 49 IU/L (ref 44–121)
BUN/Creatinine Ratio: 14 (ref 12–28)
BUN: 15 mg/dL (ref 8–27)
Bilirubin Total: 0.8 mg/dL (ref 0.0–1.2)
CO2: 24 mmol/L (ref 20–29)
Calcium: 9.9 mg/dL (ref 8.7–10.3)
Chloride: 102 mmol/L (ref 96–106)
Creatinine, Ser: 1.1 mg/dL — ABNORMAL HIGH (ref 0.57–1.00)
Globulin, Total: 2.7 g/dL (ref 1.5–4.5)
Glucose: 98 mg/dL (ref 65–99)
Potassium: 4.5 mmol/L (ref 3.5–5.2)
Sodium: 144 mmol/L (ref 134–144)
Total Protein: 6.7 g/dL (ref 6.0–8.5)
eGFR: 51 mL/min/{1.73_m2} — ABNORMAL LOW (ref >59)

## 2020-12-31 LAB — CBC WITH DIFFERENTIAL/PLATELET
Basophils Absolute: 0.1 10*3/uL (ref 0.0–0.2)
Basos: 1 %
EOS (ABSOLUTE): 0.4 10*3/uL (ref 0.0–0.4)
Eos: 5 %
Hematocrit: 41.3 % (ref 34.0–46.6)
Hemoglobin: 14.2 g/dL (ref 11.1–15.9)
Immature Grans (Abs): 0 10*3/uL (ref 0.0–0.1)
Immature Granulocytes: 0 %
Lymphocytes Absolute: 2.8 10*3/uL (ref 0.7–3.1)
Lymphs: 31 %
MCH: 30.9 pg (ref 26.6–33.0)
MCHC: 34.4 g/dL (ref 31.5–35.7)
MCV: 90 fL (ref 79–97)
Monocytes Absolute: 0.7 10*3/uL (ref 0.1–0.9)
Monocytes: 7 %
Neutrophils Absolute: 5 10*3/uL (ref 1.4–7.0)
Neutrophils: 56 %
Platelets: 210 10*3/uL (ref 150–450)
RBC: 4.59 x10E6/uL (ref 3.77–5.28)
RDW: 13.4 % (ref 11.7–15.4)
WBC: 8.9 10*3/uL (ref 3.4–10.8)

## 2020-12-31 LAB — HEMOGLOBIN A1C
Est. average glucose Bld gHb Est-mCnc: 143 mg/dL
Hgb A1c MFr Bld: 6.6 % — ABNORMAL HIGH (ref 4.8–5.6)

## 2021-01-01 ENCOUNTER — Encounter: Payer: Self-pay | Admitting: Internal Medicine

## 2021-01-03 DIAGNOSIS — Z6839 Body mass index (BMI) 39.0-39.9, adult: Secondary | ICD-10-CM | POA: Insufficient documentation

## 2021-01-03 DIAGNOSIS — E66812 Obesity, class 2: Secondary | ICD-10-CM | POA: Insufficient documentation

## 2021-01-03 DIAGNOSIS — L299 Pruritus, unspecified: Secondary | ICD-10-CM

## 2021-01-03 DIAGNOSIS — I700 Atherosclerosis of aorta: Secondary | ICD-10-CM | POA: Insufficient documentation

## 2021-01-03 DIAGNOSIS — I129 Hypertensive chronic kidney disease with stage 1 through stage 4 chronic kidney disease, or unspecified chronic kidney disease: Secondary | ICD-10-CM | POA: Insufficient documentation

## 2021-01-03 HISTORY — DX: Pruritus, unspecified: L29.9

## 2021-01-05 ENCOUNTER — Ambulatory Visit: Payer: Medicare Other | Admitting: Internal Medicine

## 2021-01-05 ENCOUNTER — Telehealth: Payer: Self-pay

## 2021-01-05 NOTE — Telephone Encounter (Signed)
This nurse called patient in order to perform scheduled telephonic AWV. We spoke this morning to make it by telephone rather than her coming to the office. At time of the call she states that she is working with customers and is unable to do the appointment at this time. States she will call back at a later time to reschedule.

## 2021-01-06 ENCOUNTER — Ambulatory Visit (INDEPENDENT_AMBULATORY_CARE_PROVIDER_SITE_OTHER): Payer: Medicare Other

## 2021-01-06 ENCOUNTER — Telehealth: Payer: Self-pay

## 2021-01-06 DIAGNOSIS — E1122 Type 2 diabetes mellitus with diabetic chronic kidney disease: Secondary | ICD-10-CM | POA: Diagnosis not present

## 2021-01-06 DIAGNOSIS — I10 Essential (primary) hypertension: Secondary | ICD-10-CM

## 2021-01-06 DIAGNOSIS — I129 Hypertensive chronic kidney disease with stage 1 through stage 4 chronic kidney disease, or unspecified chronic kidney disease: Secondary | ICD-10-CM | POA: Diagnosis not present

## 2021-01-06 DIAGNOSIS — N183 Chronic kidney disease, stage 3 unspecified: Secondary | ICD-10-CM

## 2021-01-06 NOTE — Chronic Care Management (AMB) (Signed)
Chronic Care Management    Social Work Note  01/06/2021 Name: [name] MRN: [id number] DOB: 08-11-1941  [name] is a 80 y.o. year old female who is a primary care patient of Cheryl Chard, MD. The CCM team was consulted to assist the patient with chronic disease management and/or care coordination needs related to: [employer] .   Engaged with patient by telephone for follow up visit in response to message received from [place] stating patient requested an call from SW. Patient reported at the time of today's call she was unavailable.   Consent to Services:  The patient was given information about Chronic Care Management services, agreed to services, and gave verbal consent prior to initiation of services.  Please see initial visit note for detailed documentation.   Patient agreed to services and consent obtained.   Assessment: Review of patient past medical history, allergies, medications, and health status, including review of relevant consultants reports was performed today as part of a comprehensive evaluation and provision of chronic care management and care coordination services.     SDOH (Social Determinants of Health) assessments and interventions performed:    Advanced Directives Status: Not addressed in this encounter.  CCM Care Plan  Allergies  Allergen Reactions  . Atorvastatin Calcium Itching  . Bee Venom Hives  . Oxycodone Other (See Comments)    hallucinations  . Vicodin [Hydrocodone-Acetaminophen] Other (See Comments)    hallucinations    Outpatient Encounter Medications as of 01/06/2021  Medication Sig Note  . allopurinol (ZYLOPRIM) 300 MG tablet TAKE 1 TABLET(300 MG) BY MOUTH DAILY   . aspirin EC 81 MG tablet Take 1 tablet (81 mg total) by mouth daily.   Marland Kitchen atenolol-chlorthalidone (TENORETIC) 50-25 MG tablet TAKE 1 TABLET BY MOUTH EVERY DAY AT 11 AM   . benazepril (LOTENSIN) 10 MG tablet Take 10 mg by mouth daily.   . Cholecalciferol  (VITAMIN D3) 1.25 MG (50000 UT) CAPS Take 1 capsule by mouth on Tuesday and Friday   . Continuous Blood Gluc Sensor (FREESTYLE LIBRE 2 SENSOR) MISC Use to check blood sugar. Dx code e11.65 (Patient not taking: Reported on 12/30/2020)   . Cyanocobalamin (VITAMIN B12 PO) Take by mouth.   . Dulaglutide (TRULICITY) 0.99 IP/3.8SN SOPN INJECT 0.5 ML(0.75 MG) SUBCUTANEOUSLY EVERY WEEK IN ABDOMEN, THIGH OR UPPER ARM ROTATING INJECTION SITE 11/04/2020: Per endocrinologist patient to take trulicity every other week.   . hydrOXYzine (ATARAX/VISTARIL) 10 MG tablet Take 1 tablet (10 mg total) by mouth 3 (three) times daily as needed.   . Lancets (ONETOUCH DELICA PLUS [id number]) MISC USE TO CHECK BLOOD SUGAR TWICE DAILY   . Magnesium 250 MG TABS Take 1 tablet (250 mg total) by mouth every evening. Take with dinner meal   . Multiple Vitamins-Minerals (ZINC PO) Take 1 tablet by mouth daily.    [name] test strip USE TO TEST 2 TIMES DAILY   . Probiotic Product (PROBIOTIC PO) Take 1 capsule by mouth daily.    . rosuvastatin (CRESTOR) 20 MG tablet TAKE 1 TABLET(20 MG) BY MOUTH DAILY    No facility-administered encounter medications on file as of 01/06/2021.    Patient Active Problem List   Diagnosis Date Noted  . 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 Aortic atherosclerosis ([place]) 01/03/2021  . Class 2 severe obesity due to excess calories with serious comorbidity and body mass index (BMI) of 39.0 to 39.9 in adult ([place]) 01/03/2021  . Pruritus 01/03/2021  . Vitamin D deficiency 06/01/2020  .  CAP (community acquired pneumonia) 05/06/2020  . Abdominal bloating 02/17/2019  . Urinary frequency 02/17/2019  . Uncontrolled type 2 diabetes mellitus with hyperglycemia ([place]) 02/17/2019  . Lightheaded 02/17/2019  . Constipation 06/26/2018  . Hyperlipidemia 08/23/2014  . Type 2 diabetes mellitus with stage 3 chronic kidney disease, without long-term current use of insulin ([place]) 06/19/2014  . Mixed hyperlipidemia  06/19/2014  . S/P urological surgery 09/30/2013  . Joint pain 09/30/2013  . UTI (urinary tract infection) 09/30/2013  . Cough 12/10/2012  . OA (osteoarthritis) of knee 01/05/2012  . 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 Vaginal discharge 08/02/2011  . Spasm of lumbar paraspinous muscle 06/17/2011  . Morbidly obese ([place]) 05/31/2011  . HIP PAIN, LEFT, CHRONIC 11/09/2010  . CARPAL TUNNEL SYNDROME, RIGHT 10/14/2010  . KNEE PAIN, RIGHT, CHRONIC 10/14/2010  . LEFT BUNDLE BRANCH BLOCK 07/26/2010  . 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 FATIGUE 07/01/2010  . CHEST PAIN, ATYPICAL 07/01/2010  . EPIGASTRIC PAIN 07/01/2010  . SUPRAPUBIC PAIN 07/01/2010  . HYPERGLYCEMIA 07/01/2010  . CELLULITIS AND ABSCESS OF OTHER SPECIFIED SITE 02/15/2010  . LUMP OR MASS IN BREAST 01/10/2010  . DYSURIA, CHRONIC 03/09/2009  . GERD 10/06/2008  . Personal history of colonic polyps-adenoma 08/26/2008  . LOC OSTEOARTHROS NOT SPEC PRIM/SEC LOWER LEG 09/10/2007  . GOUT, UNSPECIFIED 09/09/2007  . DIVERTICULOSIS, COLON 09/09/2007  . DIVERTICULITIS, HX OF 09/09/2007  . HYPERLIPIDEMIA 06/03/2007  . Essential hypertension 06/03/2007    Conditions to be addressed/monitored: HTN, DMII and CKD Stage III   Follow Up Plan: SW will follow up with patient by phone over the next week.      [name], BSW, CDP Social Worker, Certified Dementia Practitioner [place] [facility] [phone]

## 2021-01-06 NOTE — Chronic Care Management (AMB) (Signed)
Chronic Care Management Pharmacy Assistant   Name: [name]  MRN: [id number] DOB: November 22, 1940   Reason for Encounter: Diabetes Adherence Call     Recent office visits:  12/30/20-Sanders, Bailey Mech, MD ([place]). 12/28/20-Humble, Kendra (CCM). 12/24/20-Humble, Kendra (CCM). 12/21/20-Humble, Kendra (CCM). 12/15/20-Humble, Kendra (CCM). 12/13/20-Humble, Kendra (CCM).   Recent consult visits:  None.  Hospital visits:  None in previous 6 months   Medication changes indicated: 12/30/20-hydrOXYzine HCI 10 mg Oral 3 times daily PRN (added, for itching). Amlodipine Besy-Benazepril HCI 5-10 MG (disc). Vitamin D 1.25 MG (50000 Unit) Capsule (disc).  Medications: Outpatient Encounter Medications as of 01/06/2021  Medication Sig Note   allopurinol (ZYLOPRIM) 300 MG tablet TAKE 1 TABLET(300 MG) BY MOUTH DAILY    aspirin EC 81 MG tablet Take 1 tablet (81 mg total) by mouth daily.    atenolol-chlorthalidone (TENORETIC) 50-25 MG tablet TAKE 1 TABLET BY MOUTH EVERY DAY AT 11 AM    benazepril (LOTENSIN) 10 MG tablet Take 10 mg by mouth daily.    Cholecalciferol (VITAMIN D3) 1.25 MG (50000 UT) CAPS Take 1 capsule by mouth on Tuesday and Friday    Continuous Blood Gluc Sensor (FREESTYLE LIBRE 2 SENSOR) MISC Use to check blood sugar. Dx code e11.65 (Patient not taking: Reported on 12/30/2020)    Cyanocobalamin (VITAMIN B12 PO) Take by mouth.    Dulaglutide (TRULICITY) 6.73 AL/9.3XT SOPN INJECT 0.5 ML(0.75 MG) SUBCUTANEOUSLY EVERY WEEK IN ABDOMEN, THIGH OR UPPER ARM ROTATING INJECTION SITE 11/04/2020: Per endocrinologist patient to take trulicity every other week.    hydrOXYzine (ATARAX/VISTARIL) 10 MG tablet Take 1 tablet (10 mg total) by mouth 3 (three) times daily as needed.    Lancets (ONETOUCH DELICA PLUS [id number]) MISC USE TO CHECK BLOOD SUGAR TWICE DAILY    Magnesium 250 MG TABS Take 1 tablet (250 mg total) by mouth every evening. Take with dinner meal    Multiple Vitamins-Minerals (ZINC PO)  Take 1 tablet by mouth daily.     ONETOUCH VERIO test strip USE TO TEST 2 TIMES DAILY    Probiotic Product (PROBIOTIC PO) Take 1 capsule by mouth daily.     rosuvastatin (CRESTOR) 20 MG tablet TAKE 1 TABLET(20 MG) BY MOUTH DAILY    No facility-administered encounter medications on file as of 01/06/2021.   Recent Relevant Labs: Lab Results  Component Value Date/Time   HGBA1C 6.6 (H) 12/30/2020 01:00 PM   HGBA1C 6.3 (H) 09/01/2020 03:20 PM   MICROALBUR 10 03/19/2019 02:04 PM    Kidney Function Lab Results  Component Value Date/Time   CREATININE 1.10 (H) 12/30/2020 01:00 PM   CREATININE 1.21 (H) 08/28/2020 06:58 PM   GFR 53.18 (L) 06/19/2014 03:42 PM   GFRNONAA 46 (L) 08/28/2020 06:58 PM   GFRAA 54 (L) 06/01/2020 04:09 PM     Current antihyperglycemic regimen:  ? Trulicity 2.99 ME/2.6 mL - inject 0.75 mg every other week on Friday   What recent interventions/DTPs have been made to improve glycemic control:  o Patient reports she is taking medications as directed. o Patient reports to drinking 8 glasses day for 2 years now.   Have there been any recent hospitalizations or ED visits since last visit with CPP? No    Patient denies hypoglycemic symptoms, including Pale, Sweaty, Shaky, Hungry, Nervous/irritable and Vision changes    Patient denies hyperglycemic symptoms, including blurry vision, excessive thirst, fatigue, polyuria and weakness    How often are you checking your blood sugar? once daily and in the morning before  eating or drinking    What are your blood sugars ranging?  o Fasting: 127 - 01/04/21, 119 - 01/05/21, 95 - 01/03/21. o Before meals: None o After meals: None o Bedtime: None    During the week, how often does your blood glucose drop below 70? Never    Are you checking your feet daily/regularly? Patient reports she has not checking her feet but has no complaints or issues at this time.  Adherence Review: Is the patient currently on a STATIN  medication? Yes Is the patient currently on ACE/ARB medication? Yes Does the patient have >5 day gap between last estimated fill dates? No  Patient reports eating a bologna sandwich and 4 lemon cookies on Tuesday evening 01/04/21, believes it elevated blood sugars.   Patient voiced she does experience frequent urination but unsure if it has to do with her blood sugars or because she drinks a lot of water daily.   Star Rating Drugs: Benazepril 10mg : #30 DS, last filled 12/28/20 at [facility]. Rosuvastatin 20mg : #90 DS, last filled 12/28/20 at [facility].   Raynelle Highland, [place] Pharmacist Assistant [phone] CCM Total Time: 86 Minutes

## 2021-01-11 ENCOUNTER — Ambulatory Visit: Payer: Medicare Other

## 2021-01-11 DIAGNOSIS — N183 Chronic kidney disease, stage 3 unspecified: Secondary | ICD-10-CM

## 2021-01-11 DIAGNOSIS — I129 Hypertensive chronic kidney disease with stage 1 through stage 4 chronic kidney disease, or unspecified chronic kidney disease: Secondary | ICD-10-CM

## 2021-01-11 DIAGNOSIS — E1122 Type 2 diabetes mellitus with diabetic chronic kidney disease: Secondary | ICD-10-CM

## 2021-01-11 NOTE — Patient Instructions (Signed)
Social Worker Visit Information  Goals we discussed today:  Goals Addressed            This Visit's Progress   . Understand health plan benefits       Timeframe:  Short-Term Goal Priority:  High Start Date:  12.3.21                           Expected End Date:  2.12.22                     Next date of contact: 3.23.22  Patient Goals/Self-Care Activities Over the next 60 days, patient will:   - Self administers medications as prescribed -Attend all scheduled provider appointments -Review provided list of in network dentists -Contact SW as needed prior to next scheduled call         Follow Up Plan: SW will follow up with patient by phone over the next week  [name], BSW, CDP Social Worker, Certified Dementia Practitioner [place] [facility] [facility] [phone]

## 2021-01-11 NOTE — Chronic Care Management (AMB) (Signed)
Chronic Care Management    Social Work Note  01/11/2021 Name: [name] MRN: [id number] DOB: 1941/03/24  [name] is a 80 y.o. year old female who is a primary care patient of [name], MD. The CCM team was consulted to assist the patient with chronic disease management and/or care coordination needs related to: [employer] .   Engaged with patient by telephone for follow up visit in response to provider referral for social work chronic care management and care coordination services.   Consent to Services:  The patient was given information about Chronic Care Management services, agreed to services, and gave verbal consent prior to initiation of services.  Please see initial visit note for detailed documentation.   Patient agreed to services and consent obtained.   Assessment: Review of patient past medical history, allergies, medications, and health status, including review of relevant consultants reports was performed today as part of a comprehensive evaluation and provision of chronic care management and care coordination services.     SDOH (Social Determinants of Health) assessments and interventions performed:    Advanced Directives Status: Not addressed in this encounter.  CCM Care Plan  Allergies  Allergen Reactions  . Atorvastatin Calcium Itching  . Bee Venom Hives  . Oxycodone Other (See Comments)    hallucinations  . Vicodin [Hydrocodone-Acetaminophen] Other (See Comments)    hallucinations    Outpatient Encounter Medications as of 01/11/2021  Medication Sig Note  . allopurinol (ZYLOPRIM) 300 MG tablet TAKE 1 TABLET(300 MG) BY MOUTH DAILY   . aspirin EC 81 MG tablet Take 1 tablet (81 mg total) by mouth daily.   Marland Kitchen atenolol-chlorthalidone (TENORETIC) 50-25 MG tablet TAKE 1 TABLET BY MOUTH EVERY DAY AT 11 AM   . benazepril (LOTENSIN) 10 MG tablet Take 10 mg by mouth daily.   . Cholecalciferol (VITAMIN D3) 1.25 MG (50000 UT) CAPS Take 1 capsule by  mouth on Tuesday and Friday   . Continuous Blood Gluc Sensor (FREESTYLE LIBRE 2 SENSOR) MISC Use to check blood sugar. Dx code e11.65 (Patient not taking: Reported on 12/30/2020)   . Cyanocobalamin (VITAMIN B12 PO) Take by mouth.   . Dulaglutide (TRULICITY) 0.09 FG/1.8EX SOPN INJECT 0.5 ML(0.75 MG) SUBCUTANEOUSLY EVERY WEEK IN ABDOMEN, THIGH OR UPPER ARM ROTATING INJECTION SITE 11/04/2020: Per endocrinologist patient to take trulicity every other week.   . hydrOXYzine (ATARAX/VISTARIL) 10 MG tablet Take 1 tablet (10 mg total) by mouth 3 (three) times daily as needed.   . Lancets (ONETOUCH DELICA PLUS [id number]) MISC USE TO CHECK BLOOD SUGAR TWICE DAILY   . Magnesium 250 MG TABS Take 1 tablet (250 mg total) by mouth every evening. Take with dinner meal   . Multiple Vitamins-Minerals (ZINC PO) Take 1 tablet by mouth daily.    [name] test strip USE TO TEST 2 TIMES DAILY   . Probiotic Product (PROBIOTIC PO) Take 1 capsule by mouth daily.    . rosuvastatin (CRESTOR) 20 MG tablet TAKE 1 TABLET(20 MG) BY MOUTH DAILY    No facility-administered encounter medications on file as of 01/11/2021.    Patient Active Problem List   Diagnosis Date Noted  . 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 Aortic atherosclerosis ([place]) 01/03/2021  . Class 2 severe obesity due to excess calories with serious comorbidity and body mass index (BMI) of 39.0 to 39.9 in adult ([place]) 01/03/2021  . Pruritus 01/03/2021  . Vitamin D deficiency 06/01/2020  . CAP (community acquired pneumonia) 05/06/2020  . Abdominal bloating 02/17/2019  .  Urinary frequency 02/17/2019  . Uncontrolled type 2 diabetes mellitus with hyperglycemia ([place]) 02/17/2019  . Lightheaded 02/17/2019  . Constipation 06/26/2018  . Hyperlipidemia 08/23/2014  . Type 2 diabetes mellitus with stage 3 chronic kidney disease, without long-term current use of insulin ([place]) 06/19/2014  . Mixed hyperlipidemia 06/19/2014  . S/P urological surgery 09/30/2013  .  Joint pain 09/30/2013  . UTI (urinary tract infection) 09/30/2013  . Cough 12/10/2012  . OA (osteoarthritis) of knee 01/05/2012  . 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 Vaginal discharge 08/02/2011  . Spasm of lumbar paraspinous muscle 06/17/2011  . Morbidly obese ([place]) 05/31/2011  . HIP PAIN, LEFT, CHRONIC 11/09/2010  . CARPAL TUNNEL SYNDROME, RIGHT 10/14/2010  . KNEE PAIN, RIGHT, CHRONIC 10/14/2010  . LEFT BUNDLE BRANCH BLOCK 07/26/2010  . 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 FATIGUE 07/01/2010  . CHEST PAIN, ATYPICAL 07/01/2010  . EPIGASTRIC PAIN 07/01/2010  . SUPRAPUBIC PAIN 07/01/2010  . HYPERGLYCEMIA 07/01/2010  . CELLULITIS AND ABSCESS OF OTHER SPECIFIED SITE 02/15/2010  . LUMP OR MASS IN BREAST 01/10/2010  . DYSURIA, CHRONIC 03/09/2009  . GERD 10/06/2008  . Personal history of colonic polyps-adenoma 08/26/2008  . LOC OSTEOARTHROS NOT SPEC PRIM/SEC LOWER LEG 09/10/2007  . GOUT, UNSPECIFIED 09/09/2007  . DIVERTICULOSIS, COLON 09/09/2007  . DIVERTICULITIS, HX OF 09/09/2007  . HYPERLIPIDEMIA 06/03/2007  . Essential hypertension 06/03/2007    Conditions to be addressed/monitored: HTN, DMII and CKD Stage III  Care Plan : Social Work Care Plan  Updates made by [name] since 01/11/2021 12:00 AM    Problem: Care Coordination     Goal: Access health plan benefits   Start Date: 10/01/2020  Expected End Date: 12/11/2020  Recent Progress: On track  Priority: High  Note:   Current Barriers:  . Financial constraints related to cost of incontinent supplies . Lacks knowledge of health plan benefit . Chronic conditions including DM II, HTN, and CKD III which put patient at increased risk of hospitalization  Social Work Clinical Goal(s):  Marland Kitchen Over the next 45 days the patient will work with SW to obtain incontinent supplies from over the counter benefit . Over the next 70 days the patient will work with SW to become more knowledgeable of health plan  benefit  CCM SW Interventions: . 1:1 collaboration with [name], MD regarding development and update of comprehensive plan of care as evidenced by provider attestation and co-signature . Inter-disciplinary care team collaboration (see longitudinal plan of care) . Successful outbound call placed to the patient to advise SW received a voice message from someone with [employer] requesting a return call regarding the patient . Determined the patient has received calls from [place] and she is not sure if it is a scam so she has asked this company to contact SW to identify if it is a scam or not . Advised the patient SW was unable to determine if it was a scam . Assessed for outcome of orders for BP cuff - patient reports she has not received BP monitor for [facility] . Advised the patient SW would follow up on status . Scheduled follow up call to the patient over the next week . Collaboration with Michelle Nasuti, CMA who confirms prescription has been faxed and fax confirmation was received . Successful outbound call placed the [facility] to follow up on status of orders . Informed by Jennye Boroughs the item ordered was excluded from patient health plan contract . Discussed the patients health  plan informed this SW the item would be covered 80% - [employer] representative reports it is not covered and will cost $62.00 to fill  Patient Goals/Self-Care Activities Over the next 14 days, patient will:   - Self administers medications as prescribed -Attend all scheduled provider appointments -Review provided list of in network dentists -Contact SW as needed prior to next scheduled call   Follow up Plan:  SW will contact the patient on 3.23.22       Follow Up Plan: SW will follow up with patient by phone over the next week      [name], BSW, CDP Social Worker, Certified Dementia Practitioner [place] [facility] [place] [facility] [phone]  Total time spent performing care  coordination and/or care management activities with the patient by phone or face to face = 25 minutes.

## 2021-01-18 ENCOUNTER — Telehealth: Payer: Self-pay

## 2021-01-18 ENCOUNTER — Telehealth: Payer: Medicare Other

## 2021-01-18 NOTE — Telephone Encounter (Signed)
  Chronic Care Management   Outreach Note  01/18/2021 Name: [name] MRN: [id number] DOB: 06-13-1941  Referred by: [name], MD Reason for referral : Chronic Care Management (RN CM FU Call Attempt )   An unsuccessful telephone outreach was attempted today. The patient was referred to the case management team for assistance with care management and care coordination.   Follow Up Plan: A HIPAA compliant phone message was left for the patient providing contact information and requesting a return call. Telephone follow up appointment with care management team member scheduled for: 02/22/21  Barb Merino, RN, BSN, CCM Care Management Coordinator [place] [facility]  Direct Phone: [phone]

## 2021-01-19 ENCOUNTER — Telehealth: Payer: Self-pay

## 2021-01-19 ENCOUNTER — Telehealth: Payer: Medicare Other

## 2021-01-19 NOTE — Telephone Encounter (Signed)
  Chronic Care Management   Outreach Note  01/19/2021 Name: [name] MRN: [id number] DOB: 1941/04/08  Referred by: [name], MD Reason for referral : Chronic Care Management   Successful outbound call placed to the patient to complete SW follow up visit. Patient indicated she was unable to complete today's call.   Follow Up Plan: Rescheduled call for Friday 3.25.22  [name], BSW, CDP Social Worker, Certified Dementia Practitioner [place] [facility] [place] [facility] [phone]

## 2021-01-21 ENCOUNTER — Ambulatory Visit: Payer: Medicare Other

## 2021-01-21 DIAGNOSIS — E1122 Type 2 diabetes mellitus with diabetic chronic kidney disease: Secondary | ICD-10-CM | POA: Diagnosis not present

## 2021-01-21 DIAGNOSIS — N183 Chronic kidney disease, stage 3 unspecified: Secondary | ICD-10-CM | POA: Diagnosis not present

## 2021-01-21 DIAGNOSIS — I10 Essential (primary) hypertension: Secondary | ICD-10-CM | POA: Diagnosis not present

## 2021-01-21 DIAGNOSIS — I129 Hypertensive chronic kidney disease with stage 1 through stage 4 chronic kidney disease, or unspecified chronic kidney disease: Secondary | ICD-10-CM | POA: Diagnosis not present

## 2021-01-21 NOTE — Patient Instructions (Signed)
Social Worker Visit Information  Goals we discussed today:  Goals Addressed            This Visit's Progress   . Understand health plan benefits       Timeframe:  Short-Term Goal Priority:  High Start Date:  12.3.21                           Expected End Date:  2.12.22                     Next date of contact: 3.29.22  Patient Goals/Self-Care Activities Over the next 60 days, patient will:   - Self administers medications as prescribed -Attend all scheduled provider appointments -Contact [facility] -Contact SW as needed prior to next scheduled call         Materials Provided: Verbal education about health plan  provided by phone  Follow Up Plan: SW will follow up with patient by phone over the next week   [name], BSW, CDP Social Worker, Certified Dementia Practitioner [place] / [place] [facility] [phone]

## 2021-01-21 NOTE — Chronic Care Management (AMB) (Signed)
Chronic Care Management    Social Work Note  01/21/2021 Name: [name] MRN: [id number] DOB: 29-Jul-1941  [name] is a 80 y.o. year old female who is a primary care patient of [name], MD. The CCM team was consulted to assist the patient with chronic disease management and/or care coordination needs related to: [employer] .   Engaged with patient by telephone for follow up visit in response to provider referral for social work chronic care management and care coordination services.   Consent to Services:  The patient was given information about Chronic Care Management services, agreed to services, and gave verbal consent prior to initiation of services.  Please see initial visit note for detailed documentation.   Patient agreed to services and consent obtained.   Assessment: Review of patient past medical history, allergies, medications, and health status, including review of relevant consultants reports was performed today as part of a comprehensive evaluation and provision of chronic care management and care coordination services.     SDOH (Social Determinants of Health) assessments and interventions performed:    Advanced Directives Status: Not addressed in this encounter.  CCM Care Plan  Allergies  Allergen Reactions  . Atorvastatin Calcium Itching  . Bee Venom Hives  . Oxycodone Other (See Comments)    hallucinations  . Vicodin [Hydrocodone-Acetaminophen] Other (See Comments)    hallucinations    Outpatient Encounter Medications as of 01/21/2021  Medication Sig Note  . allopurinol (ZYLOPRIM) 300 MG tablet TAKE 1 TABLET(300 MG) BY MOUTH DAILY   . aspirin EC 81 MG tablet Take 1 tablet (81 mg total) by mouth daily.   Marland Kitchen atenolol-chlorthalidone (TENORETIC) 50-25 MG tablet TAKE 1 TABLET BY MOUTH EVERY DAY AT 11 AM   . benazepril (LOTENSIN) 10 MG tablet Take 10 mg by mouth daily.   . Cholecalciferol (VITAMIN D3) 1.25 MG (50000 UT) CAPS Take 1 capsule by  mouth on Tuesday and Friday   . Continuous Blood Gluc Sensor (FREESTYLE LIBRE 2 SENSOR) MISC Use to check blood sugar. Dx code e11.65 (Patient not taking: Reported on 12/30/2020)   . Cyanocobalamin (VITAMIN B12 PO) Take by mouth.   . Dulaglutide (TRULICITY) 0.25 EN/2.7PO SOPN INJECT 0.5 ML(0.75 MG) SUBCUTANEOUSLY EVERY WEEK IN ABDOMEN, THIGH OR UPPER ARM ROTATING INJECTION SITE 11/04/2020: Per endocrinologist patient to take trulicity every other week.   . hydrOXYzine (ATARAX/VISTARIL) 10 MG tablet Take 1 tablet (10 mg total) by mouth 3 (three) times daily as needed.   . Lancets (ONETOUCH DELICA PLUS [id number]) MISC USE TO CHECK BLOOD SUGAR TWICE DAILY   . Magnesium 250 MG TABS Take 1 tablet (250 mg total) by mouth every evening. Take with dinner meal   . Multiple Vitamins-Minerals (ZINC PO) Take 1 tablet by mouth daily.    [name] test strip USE TO TEST 2 TIMES DAILY   . Probiotic Product (PROBIOTIC PO) Take 1 capsule by mouth daily.    . rosuvastatin (CRESTOR) 20 MG tablet TAKE 1 TABLET(20 MG) BY MOUTH DAILY    No facility-administered encounter medications on file as of 01/21/2021.    Patient Active Problem List   Diagnosis Date Noted  . 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 Aortic atherosclerosis ([place]) 01/03/2021  . Class 2 severe obesity due to excess calories with serious comorbidity and body mass index (BMI) of 39.0 to 39.9 in adult ([place]) 01/03/2021  . Pruritus 01/03/2021  . Vitamin D deficiency 06/01/2020  . CAP (community acquired pneumonia) 05/06/2020  . Abdominal bloating 02/17/2019  .  Urinary frequency 02/17/2019  . Uncontrolled type 2 diabetes mellitus with hyperglycemia ([place]) 02/17/2019  . Lightheaded 02/17/2019  . Constipation 06/26/2018  . Hyperlipidemia 08/23/2014  . Type 2 diabetes mellitus with stage 3 chronic kidney disease, without long-term current use of insulin ([place]) 06/19/2014  . Mixed hyperlipidemia 06/19/2014  . S/P urological surgery 09/30/2013  .  Joint pain 09/30/2013  . UTI (urinary tract infection) 09/30/2013  . Cough 12/10/2012  . OA (osteoarthritis) of knee 01/05/2012  . 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 Vaginal discharge 08/02/2011  . Spasm of lumbar paraspinous muscle 06/17/2011  . Morbidly obese ([place]) 05/31/2011  . HIP PAIN, LEFT, CHRONIC 11/09/2010  . CARPAL TUNNEL SYNDROME, RIGHT 10/14/2010  . KNEE PAIN, RIGHT, CHRONIC 10/14/2010  . LEFT BUNDLE BRANCH BLOCK 07/26/2010  . 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 FATIGUE 07/01/2010  . CHEST PAIN, ATYPICAL 07/01/2010  . EPIGASTRIC PAIN 07/01/2010  . SUPRAPUBIC PAIN 07/01/2010  . HYPERGLYCEMIA 07/01/2010  . CELLULITIS AND ABSCESS OF OTHER SPECIFIED SITE 02/15/2010  . LUMP OR MASS IN BREAST 01/10/2010  . DYSURIA, CHRONIC 03/09/2009  . GERD 10/06/2008  . Personal history of colonic polyps-adenoma 08/26/2008  . LOC OSTEOARTHROS NOT SPEC PRIM/SEC LOWER LEG 09/10/2007  . GOUT, UNSPECIFIED 09/09/2007  . DIVERTICULOSIS, COLON 09/09/2007  . DIVERTICULITIS, HX OF 09/09/2007  . HYPERLIPIDEMIA 06/03/2007  . Essential hypertension 06/03/2007    Conditions to be addressed/monitored: HTN, DMII and CKD Stage III; Financial constraints related to costs of medical care  Care Plan : Social Work Care Plan  Updates made by [name] since 01/21/2021 12:00 AM    Problem: Care Coordination     Goal: Access health plan benefits   Start Date: 10/01/2020  Expected End Date: 12/11/2020  Recent Progress: On track  Priority: High  Note:   Current Barriers:  . Financial constraints related to cost of incontinent supplies . Lacks knowledge of health plan benefit . Chronic conditions including DM II, HTN, and CKD III which put patient at increased risk of hospitalization  Social Work Clinical Goal(s):  Marland Kitchen Over the next 45 days the patient will work with SW to obtain incontinent supplies from over the counter benefit . Over the next 70 days the patient will  work with SW to become more knowledgeable of health plan benefit  CCM SW Interventions: . 1:1 collaboration with [name], MD regarding development and update of comprehensive plan of care as evidenced by provider attestation and co-signature . Inter-disciplinary care team collaboration (see longitudinal plan of care) . Successful outbound call placed to the patient to assist with care coordination needs . Advised the patient SW could assist her with contacting [place] if she was interested in learning about this program - patient declined . Discussed outcome of SW call to [facility] indicating the patients health plan would not cover a BP cuff even though this is the DME supplier the patients health plan instructed [place] to use . Joint call placed to the patients health plan regarding outcome of order sent to [facility] . Advised by the patients health plan, the patient would need to contact [facility] [phone]) to obtain an automatic BP cuff . Joint call placed to [facility]- after an extensive hold time the call was disconnected . Determined the patient would call [facility] back on her own to determine benefits and if her health plan will cover a BP cuff . Advised the patient SW will contact her over the next 3 business days  to review outcome of patients call Patient Goals/Self-Care Activities Over the next 14 days, patient will:   - Self administers medications as prescribed -Attend all scheduled provider appointments -Contact [facility] -Contact SW as needed prior to next scheduled call   Follow up Plan:  SW will contact the patient on 3.29.22       Follow Up Plan: SW will follow up with patient by phone over the next week      [name], BSW, CDP Social Worker, Certified Dementia Practitioner [place] / [place] [facility] [phone]  Total time spent performing care coordination and/or care management activities with the patient by phone or face to face = 71 minutes.

## 2021-01-25 ENCOUNTER — Telehealth: Payer: Medicare Other

## 2021-01-25 ENCOUNTER — Telehealth: Payer: Self-pay

## 2021-01-25 NOTE — Telephone Encounter (Signed)
  Chronic Care Management   Outreach Note  01/25/2021 Name: [name] MRN: [id number] DOB: Feb 23, 1941  Referred by: [name], MD Reason for referral : Chronic Care Management   Sw placed a follow up call to the patient to assess outcome of call with [facility]. Unfortunately, patient was at work and unable to complete the call at this time. Patient reports she will contact SW later this afternoon.  Follow Up Plan: Sw will attempt a second outreach over the next 14 days if a return call is not received.  [name], BSW, CDP Social Worker, Certified Dementia Practitioner [place] / [place] [facility] [phone]

## 2021-02-01 ENCOUNTER — Other Ambulatory Visit: Payer: Self-pay

## 2021-02-01 ENCOUNTER — Ambulatory Visit (INDEPENDENT_AMBULATORY_CARE_PROVIDER_SITE_OTHER): Payer: Medicare Other | Admitting: Nurse Practitioner

## 2021-02-01 ENCOUNTER — Telehealth: Payer: Self-pay

## 2021-02-01 ENCOUNTER — Encounter: Payer: Self-pay | Admitting: Nurse Practitioner

## 2021-02-01 VITALS — BP 144/79 | HR 60 | Temp 97.8°F | Ht 65.4 in | Wt 233.6 lb

## 2021-02-01 DIAGNOSIS — N760 Acute vaginitis: Secondary | ICD-10-CM

## 2021-02-01 DIAGNOSIS — Z9889 Other specified postprocedural states: Secondary | ICD-10-CM | POA: Diagnosis not present

## 2021-02-01 DIAGNOSIS — R350 Frequency of micturition: Secondary | ICD-10-CM | POA: Diagnosis not present

## 2021-02-01 MED ORDER — CEPHALEXIN 250 MG PO CAPS: 250.0000 mg | ORAL_CAPSULE | Freq: Four times a day (QID) | ORAL | 0 refills | Status: AC

## 2021-02-01 NOTE — Telephone Encounter (Cosign Needed)
Patient called and let me know that [employer] reported they did not have an application turned in except for September 2021. Patients application was faxed on January 25th, 2022. Call on either cell phone or work phone. Will follow up and have application refaxed today. Contacted [employer] and spoke to Lawtey who informed that she has not received the new enrollment for the patient. Spoke with [employer] who reports they did not receive the patient assistance paperwork sent. They are going to expedite the medication to the patient once the application is processed again today. Once it is received today they will mark it as urgent and send it as soon as possible to the patient. Spoke with patient assistant Luellen Pucker. Will let patient know this information. Of note, faxed cover sheet states sent on 11/23/2020, the representative gave me an additional fax number [phone]. Sent fax again, and contacted patient.

## 2021-02-01 NOTE — Telephone Encounter (Cosign Needed)
Spoke with PCP team Doreene Burke, who informed me patient was out of Trulicity and needed help.  Contacted patient per request of provider. Patient reports that she has not called for the refill in the Trulicity from patient assistance, she lost the phone number but she reports that she is going to call today. She assured me that she has not missed any doses of her Trulicity 0.75mg /0.5 ml that she takes every other week. She would like to have it automatically filled every month. She is going to [employer] of her wishes. In the meantime, we will give her a box of samples, which is two pens and she reports that she wont need it this week but next week. I will label a box of  and place it back in the refrigerator. She will be picking up the medication next week prior to her next dose. I encouraged her to call back if there any issues or questions that she might have.   [name], PharmD Clinical Pharmacist [facility] [phone]  Total Time: 21 minutes

## 2021-02-01 NOTE — Progress Notes (Signed)
I,Yamilka Roman [employer] as a Education administrator for [employer], FNP.,have documented all relevant documentation on the behalf of [name], FNP,as directed by  [name], FNP while in the presence of [name], [place]. This visit occurred during the SARS-CoV-2 public health emergency.  Safety protocols were in place, including screening questions prior to the visit, additional usage of staff PPE, and extensive cleaning of exam room while observing appropriate contact time as indicated for disinfecting solutions.  Subjective:     Patient ID: [name] , female    DOB: 07/25/1941 , 80 y.o.   MRN: [id number]   Chief Complaint  Patient presents with  . Recurrent Skin Infections    Patient presents today for a vaginal boil that she noticed about 4 days ago. She stated she thinks it burst    HPI  Patient presents today for a boil in her vaginal area. She stated she feels the boil has burst. She said it was as big as an egg and was painful she noticed it about 4 days ago.  She squeezed the area until it opened.  She has had this to occur in the past. This is the first time when she comes in to have looked at.     Past Medical History:  Diagnosis Date  . Abdominal pain   . Arthritis    cervical disc degeneration/ oa left knee, carpal tunnel rt wrist, adhesive capsulitis right shoulder, rt hand weakness; lumbar degeneration  . Carpal tunnel syndrome   . 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 Constipation   . Diabetes mellitus without complication ([place])    borderline - diet control  . Diverticulitis hx of  . Diverticulosis   . E. coli infection    2020  . Frequent UTI    hx of urethral injury during colon surgery - states frequent uti's since  . GERD (gastroesophageal reflux disease)   . H/O hiatal hernia   .  History of palpitations    in the past  . History of shingles    has a lingering itching on back where shingles were  . Hyperlipidemia   . Hypertension   . Numbness and tingling in right hand    pt. states has numbness of right hand very frequently-watch positioning  . Obesity   . Osteoarthritis   . Osteoporosis   . Pain    pain left knee and pain right hip and right groin  . Personal history of colonic polyps-adenoma 08/26/2008  . Pneumonia 2005  . Vitamin D deficiency      Family History  Problem Relation Age of Onset  . Breast cancer Mother 69  . Hypertension Mother   . Prostate cancer Father   . Hypertension Father   . Dementia Sister   . Lung cancer Brother   . Hypertension Brother   . COPD Brother   . Cancer Maternal Grandmother   . Arthritis Sister   . Hypertension Sister   . Arthritis Sister   . Hypertension Child   . Hypertension Child   . Hypertension Child   . Colon polyps Neg Hx   . Esophageal cancer Neg Hx   . Rectal cancer Neg Hx   . Stomach cancer Neg Hx   . Colon cancer Neg Hx      Current Outpatient Medications:  .  allopurinol (ZYLOPRIM) 300 MG tablet, TAKE 1 TABLET(300 MG) BY MOUTH DAILY, Disp: 90 tablet, Rfl: 0 .  aspirin EC 81 MG tablet, Take 1 tablet (81 mg total) by mouth daily., Disp: 90 tablet, Rfl: 0 .  atenolol-chlorthalidone (TENORETIC) 50-25 MG tablet, TAKE 1 TABLET BY MOUTH EVERY DAY AT 11 AM, Disp: 90 tablet, Rfl: 0 .  benazepril (LOTENSIN) 10 MG tablet, Take 10 mg by mouth daily., Disp: , Rfl:  .  cephALEXin (KEFLEX) 250 MG capsule, Take 1 capsule (250 mg total) by mouth 4 (four) times daily for 10 days., Disp: 40 capsule, Rfl: 0 .  Cholecalciferol (VITAMIN D3) 1.25 MG (50000 UT) CAPS, Take 1 capsule by mouth on Tuesday and Friday, Disp: 24 capsule, Rfl: 1 .  Cyanocobalamin (VITAMIN B12 PO), Take by mouth., Disp: , Rfl:  .  Dulaglutide (TRULICITY) 9.23 RA/0.7MA SOPN, INJECT 0.5 ML(0.75 MG) SUBCUTANEOUSLY EVERY WEEK IN ABDOMEN, THIGH  OR UPPER ARM ROTATING INJECTION SITE, Disp: 2 mL, Rfl: 0 .  hydrOXYzine (ATARAX/VISTARIL) 10 MG tablet, Take 1 tablet (10 mg total) by mouth 3 (three) times daily as needed., Disp: 30 tablet, Rfl: 0 .  Lancets (ONETOUCH DELICA PLUS [id number]) MISC, USE TO CHECK BLOOD SUGAR TWICE DAILY, Disp: 100 each, Rfl: 3 .  Magnesium 250 MG TABS, Take 1 tablet (250 mg total) by mouth every evening. Take with dinner meal, Disp: 90 tablet, Rfl: 1 .  Multiple Vitamins-Minerals (ZINC PO), Take 1 tablet by mouth daily. , Disp: , Rfl:  .  ONETOUCH VERIO test strip, USE TO TEST 2 TIMES DAILY, Disp: 100 strip, Rfl: 3 .  Probiotic Product (PROBIOTIC PO), Take 1 capsule by mouth daily. , Disp: , Rfl:  .  rosuvastatin (CRESTOR) 20 MG tablet, TAKE 1 TABLET(20 MG) BY MOUTH DAILY, Disp: 90 tablet, Rfl: 0 .  Continuous Blood Gluc Sensor (FREESTYLE LIBRE 2 SENSOR) MISC, Use to check blood sugar. Dx code e11.65 (Patient not taking: No sig reported), Disp: 3 each, Rfl: 3   Allergies  Allergen Reactions  . Atorvastatin Calcium Itching  . Bee Venom Hives  . Oxycodone Other (See Comments)    hallucinations  . Vicodin [Hydrocodone-Acetaminophen] Other (See Comments)    hallucinations     Review of Systems  Constitutional: Negative.   Respiratory: Negative.  Negative for cough.   Cardiovascular: Negative.   Genitourinary: Positive for frequency and vaginal pain (due to having a boil to vaginal area. - last time occurred was 6 months ago).       Urinary incontinence  Neurological: Negative for dizziness and headaches.  Psychiatric/Behavioral: Negative.      Today's Vitals   02/01/21 0839 02/01/21 0844  BP: 132/78 (!) 144/79  Pulse: 68 60  Temp: 97.8 F (36.6 C)   TempSrc: Oral   Weight: 233 lb 9.6 oz (106 kg)   Height: 5' 5.4" (1.661 m)   PainSc: 0-No pain    Body mass index is 38.4 kg/m.   Objective:  Physical Exam Constitutional:      General: She is not in acute distress.    Appearance: Normal  appearance.  Cardiovascular:     Rate and Rhythm: Normal rate and regular rhythm.     Pulses: Normal pulses.     Heart sounds: Normal heart sounds. No murmur heard.   Genitourinary:    Tanner stage (genital): 5.     Labia:        Right: No rash, tenderness or lesion.        Left:  Tenderness and lesion present. No rash.        Comments: She has a firm boil approximately the size of a quarter with approximately 60mm depth present to left inner vaginal area. There is an open area present with pale yellow appearance in color, wound sample done.  Skin:    General: Skin is warm.     Capillary Refill: Capillary refill takes less than 2 seconds.  Neurological:     General: No focal deficit present.     Mental Status: She is alert and oriented to person, place, and time.     Cranial Nerves: No cranial nerve deficit.  Psychiatric:        Mood and Affect: Mood normal.        Behavior: Behavior normal.        Thought Content: Thought content normal.        Judgment: Judgment normal.         Assessment And Plan:     1. Abscess, vagina  Firm boil present to left inner labia posteriorly, open area noted as well with pale yellow appearance. Obtained wound culture. Will treat with cephalexin due to kidney functions GFR less than 60.    She is encouraged to do warm water soaks and warm compresses  I have also discussed with her to not use scented soaps to the vaginal area as this can cause an increase risk for infections - cephALEXin (KEFLEX) 250 MG capsule; Take 1 capsule (250 mg total) by mouth 4 (four) times daily for 10 days.  Dispense: 40 capsule; Refill: 0 - Culture, Wound  2. Urinary frequency  This is persistent since having her surgery on her urethra 10 years abo however she is worse at night up every 2 hours. She is wearing depends and when she sits down sometimes at work she has urinary incontinence - Ambulatory referral to Urology  3. S/P urological surgery - Ambulatory  referral to Urology   I have also discussed with her recent labs during her visit  Patient was given opportunity to ask questions. Patient verbalized understanding of the plan and was able to repeat key elements of the plan. All questions were answered to their satisfaction.  [name], FNP   I, [name], FNP, have reviewed all documentation for this visit. The documentation on 02/01/21 for the exam, diagnosis, procedures, and orders are all accurate and complete.   IF YOU HAVE BEEN REFERRED TO A SPECIALIST, IT MAY TAKE 1-2 WEEKS TO SCHEDULE/PROCESS THE REFERRAL. IF YOU HAVE NOT HEARD FROM US/SPECIALIST IN TWO WEEKS, PLEASE GIVE [place] A CALL AT [phone].   THE PATIENT IS ENCOURAGED TO PRACTICE SOCIAL DISTANCING DUE TO THE COVID-19 PANDEMIC.

## 2021-02-02 NOTE — Telephone Encounter (Signed)
Called pt back no answer left VM

## 2021-02-03 DIAGNOSIS — E119 Type 2 diabetes mellitus without complications: Secondary | ICD-10-CM | POA: Diagnosis not present

## 2021-02-03 DIAGNOSIS — Z961 Presence of intraocular lens: Secondary | ICD-10-CM | POA: Diagnosis not present

## 2021-02-03 DIAGNOSIS — H524 Presbyopia: Secondary | ICD-10-CM | POA: Diagnosis not present

## 2021-02-03 DIAGNOSIS — H5213 Myopia, bilateral: Secondary | ICD-10-CM | POA: Diagnosis not present

## 2021-02-03 DIAGNOSIS — H52203 Unspecified astigmatism, bilateral: Secondary | ICD-10-CM | POA: Diagnosis not present

## 2021-02-03 LAB — HM DIABETES EYE EXAM

## 2021-02-04 LAB — WOUND CULTURE: Organism ID, Bacteria: NONE SEEN

## 2021-02-08 ENCOUNTER — Ambulatory Visit (INDEPENDENT_AMBULATORY_CARE_PROVIDER_SITE_OTHER): Payer: Medicare Other | Admitting: Nurse Practitioner

## 2021-02-08 ENCOUNTER — Other Ambulatory Visit: Payer: Self-pay

## 2021-02-08 ENCOUNTER — Encounter: Payer: Self-pay | Admitting: Nurse Practitioner

## 2021-02-08 VITALS — BP 163/93 | HR 55 | Temp 98.3°F | Ht 65.4 in | Wt 237.6 lb

## 2021-02-08 DIAGNOSIS — N760 Acute vaginitis: Secondary | ICD-10-CM

## 2021-02-08 DIAGNOSIS — R002 Palpitations: Secondary | ICD-10-CM | POA: Diagnosis not present

## 2021-02-08 DIAGNOSIS — R195 Other fecal abnormalities: Secondary | ICD-10-CM | POA: Diagnosis not present

## 2021-02-08 NOTE — Progress Notes (Signed)
I,Yamilka Roman [employer] as a Education administrator for [employer], FNP.,have documented all relevant documentation on the behalf of [name], FNP,as directed by  [name], FNP while in the presence of [name], [place]. This visit occurred during the SARS-CoV-2 public health emergency.  Safety protocols were in place, including screening questions prior to the visit, additional usage of staff PPE, and extensive cleaning of exam room while observing appropriate contact time as indicated for disinfecting solutions.  Subjective:     Patient ID: [name] , female    DOB: [birth year] , 80 y.o.   MRN: [id number]   Chief Complaint  Patient presents with  . abscess f/u     HPI  Patient presents today for a f/u on her abscess. Patient stated the abscess is getting better it has gone down a lot. She has 4 days left of her antibiotic. She stated she now has a boil on her neck that she noticed last Saturday she did say she was able to squeeze it and get some of the pus out. She also would like to get a diabetic eye exam.   She reports heart palpitations since she was seen last. She was not eating like she was supposed to. She had been drinking coffee and cinnamon tea she will take for her diabetes.   She reports she had dark stools and thought was related to taking elderberry. Improved after stopping the elderberry. She was having problems seeing small letters.      Past Medical History:  Diagnosis Date  . Abdominal pain   . Arthritis    cervical disc degeneration/ oa left knee, carpal tunnel rt wrist, adhesive capsulitis right shoulder, rt hand weakness; lumbar degeneration  . Carpal tunnel syndrome   . 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 Constipation   . Diabetes mellitus without complication ([place])    borderline -  diet control  . Diverticulitis hx of  . Diverticulosis   . E. coli infection    2020  . Frequent UTI    hx of urethral injury during colon surgery - states frequent uti's since  . GERD (gastroesophageal reflux disease)   . H/O hiatal hernia   . History of palpitations    in the past  . History of shingles    has a lingering itching on back where shingles were  . Hyperlipidemia   . Hypertension   . Numbness and tingling in right hand    pt. states has numbness of right hand very frequently-watch positioning  . Obesity   . Osteoarthritis   . Osteoporosis   . Pain    pain left knee and pain right hip and right groin  . Personal history of colonic polyps-adenoma 08/26/2008  . Pneumonia 2005  . Vitamin D deficiency      Family History  Problem Relation Age of Onset  . Breast cancer Mother 23  . Hypertension Mother   . Prostate cancer Father   . Hypertension Father   . Dementia Sister   . Lung cancer Brother   . Hypertension Brother   . COPD Brother   . Cancer Maternal Grandmother   . Arthritis Sister   . Hypertension Sister   . Arthritis Sister   . Hypertension Child   . Hypertension Child   . Hypertension Child   .  Colon polyps Neg Hx   . Esophageal cancer Neg Hx   . Rectal cancer Neg Hx   . Stomach cancer Neg Hx   . Colon cancer Neg Hx      Current Outpatient Medications:  .  allopurinol (ZYLOPRIM) 300 MG tablet, TAKE 1 TABLET(300 MG) BY MOUTH DAILY, Disp: 90 tablet, Rfl: 0 .  aspirin EC 81 MG tablet, Take 1 tablet (81 mg total) by mouth daily., Disp: 90 tablet, Rfl: 0 .  atenolol-chlorthalidone (TENORETIC) 50-25 MG tablet, TAKE 1 TABLET BY MOUTH EVERY DAY AT 11 AM, Disp: 90 tablet, Rfl: 0 .  benazepril (LOTENSIN) 10 MG tablet, Take 10 mg by mouth daily., Disp: , Rfl:  .  Cholecalciferol (VITAMIN D3) 1.25 MG (50000 UT) CAPS, Take 1 capsule by mouth on Tuesday and Friday, Disp: 24 capsule, Rfl: 1 .  Cyanocobalamin (VITAMIN B12 PO), Take by mouth. (Patient not  taking: Reported on 02/23/2021), Disp: , Rfl:  .  Dulaglutide (TRULICITY) 9.32 TF/5.7DU SOPN, INJECT 0.5 ML(0.75 MG) SUBCUTANEOUSLY EVERY WEEK IN ABDOMEN, THIGH OR UPPER ARM ROTATING INJECTION SITE, Disp: 2 mL, Rfl: 0 .  hydrOXYzine (ATARAX/VISTARIL) 10 MG tablet, Take 1 tablet (10 mg total) by mouth 3 (three) times daily as needed. (Patient not taking: Reported on 02/23/2021), Disp: 30 tablet, Rfl: 0 .  Lancets (ONETOUCH DELICA PLUS [id number]) MISC, USE TO CHECK BLOOD SUGAR TWICE DAILY, Disp: 100 each, Rfl: 3 .  Magnesium 250 MG TABS, Take 1 tablet (250 mg total) by mouth every evening. Take with dinner meal (Patient not taking: Reported on 02/23/2021), Disp: 90 tablet, Rfl: 1 .  Multiple Vitamins-Minerals (ZINC PO), Take 1 tablet by mouth daily.  (Patient not taking: Reported on 02/23/2021), Disp: , Rfl:  .  ONETOUCH VERIO test strip, USE TO TEST 2 TIMES DAILY, Disp: 100 strip, Rfl: 3 .  Probiotic Product (PROBIOTIC PO), Take 1 capsule by mouth daily. , Disp: , Rfl:  .  rosuvastatin (CRESTOR) 20 MG tablet, TAKE 1 TABLET(20 MG) BY MOUTH DAILY, Disp: 90 tablet, Rfl: 0 .  Vitamin D, Ergocalciferol, (DRISDOL) 1.25 MG (50000 UNIT) CAPS capsule, TAKE 1 CAPSULE BY MOUTH TWICE WEEKLY ON TUESDAYS AND FRIDAYS, Disp: 26 capsule, Rfl: 0   Allergies  Allergen Reactions  . Atorvastatin Calcium Itching  . Bee Venom Hives  . Oxycodone Other (See Comments)    hallucinations  . Vicodin [Hydrocodone-Acetaminophen] Other (See Comments)    hallucinations     Review of Systems  Constitutional: Negative.  Negative for fatigue.  Respiratory: Negative.  Negative for cough, shortness of breath and wheezing.   Cardiovascular: Positive for palpitations. Negative for chest pain and leg swelling.  Neurological: Negative for dizziness and headaches.  Psychiatric/Behavioral: Negative.      Today's Vitals   02/08/21 1139 02/08/21 1146  BP: (!) 150/80 (!) 163/93  Pulse: (!) 55 (!) 55  Temp: 98.3 F (36.8 C)    TempSrc: Oral   Weight: 237 lb 9.6 oz (107.8 kg)   Height: 5' 5.4" (1.661 m)   PainSc: 0-No pain    Body mass index is 39.06 kg/m.   Objective:  Physical Exam Constitutional:      General: She is not in acute distress.    Appearance: Normal appearance. She is obese.  Cardiovascular:     Rate and Rhythm: Normal rate and regular rhythm.     Pulses: Normal pulses.     Heart sounds: Normal heart sounds. No murmur heard.   Pulmonary:     Effort:  Pulmonary effort is normal. No respiratory distress.     Breath sounds: Normal breath sounds. No wheezing.  Skin:    General: Skin is warm.  Neurological:     General: No focal deficit present.     Mental Status: She is alert and oriented to person, place, and time.     Cranial Nerves: No cranial nerve deficit.  Psychiatric:        Mood and Affect: Mood normal.        Behavior: Behavior normal.        Thought Content: Thought content normal.        Judgment: Judgment normal.         Assessment And Plan:     1. Abscess, vagina  This is improving, she reports not as uncomfortable  2. Palpitations  EKG done with Sinus Bradycardia, left bundle branch block - EKG 12-Lead  3. Dark stools  I have given her stool cards to test her stool for blood       Patient was given opportunity to ask questions. Patient verbalized understanding of the plan and was able to repeat key elements of the plan. All questions were answered to their satisfaction.  [name], FNP   I, [name], FNP, have reviewed all documentation for this visit. The documentation on 02/08/21 for the exam, diagnosis, procedures, and orders are all accurate and complete.   IF YOU HAVE BEEN REFERRED TO A SPECIALIST, IT MAY TAKE 1-2 WEEKS TO SCHEDULE/PROCESS THE REFERRAL. IF YOU HAVE NOT HEARD FROM US/SPECIALIST IN TWO WEEKS, PLEASE GIVE [place] A CALL AT [phone] X [phone].   THE PATIENT IS ENCOURAGED TO PRACTICE SOCIAL DISTANCING DUE TO THE COVID-19 PANDEMIC.

## 2021-02-09 ENCOUNTER — Ambulatory Visit (INDEPENDENT_AMBULATORY_CARE_PROVIDER_SITE_OTHER): Payer: Medicare Other

## 2021-02-09 DIAGNOSIS — I10 Essential (primary) hypertension: Secondary | ICD-10-CM

## 2021-02-09 DIAGNOSIS — E1122 Type 2 diabetes mellitus with diabetic chronic kidney disease: Secondary | ICD-10-CM

## 2021-02-09 DIAGNOSIS — N183 Chronic kidney disease, stage 3 unspecified: Secondary | ICD-10-CM | POA: Diagnosis not present

## 2021-02-09 NOTE — Patient Instructions (Signed)
Social Worker Visit Information  Goals we discussed today:  Goals Addressed            This Visit's Progress   . Understand health plan benefits       Timeframe:  Short-Term Goal Priority:  High Start Date:  12.3.21                           Expected End Date:  2.12.22                     Next date of contact: 4.22.22  Patient Goals/Self-Care Activities Over the next 60 days, patient will:   - Self administers medications as prescribed -Attend all scheduled provider appointments -Contact SW as needed prior to next scheduled call        Follow Up Plan: SW will follow up with patient by phone over the next two weeks.   [name], BSW, CDP Social Worker, Certified Dementia Practitioner [facility] [phone]

## 2021-02-09 NOTE — Chronic Care Management (AMB) (Signed)
Chronic Care Management    Social Work Note  02/09/2021 Name: [name] MRN: [id number] DOB: 21-Apr-1941  [name] is a 80 y.o. year old female who is a primary care patient of [name], MD. The CCM team was consulted to assist the patient with chronic disease management and/or care coordination needs related to: [employer] .   Engaged with patient by telephone for follow up visit in response to provider referral for social work chronic care management and care coordination services.   Consent to Services:  The patient was given information about Chronic Care Management services, agreed to services, and gave verbal consent prior to initiation of services.  Please see initial visit note for detailed documentation.   Patient agreed to services and consent obtained.   Assessment: Review of patient past medical history, allergies, medications, and health status, including review of relevant consultants reports was performed today as part of a comprehensive evaluation and provision of chronic care management and care coordination services.     SDOH (Social Determinants of Health) assessments and interventions performed:    Advanced Directives Status: Not addressed in this encounter.  CCM Care Plan  Allergies  Allergen Reactions  . Atorvastatin Calcium Itching  . Bee Venom Hives  . Oxycodone Other (See Comments)    hallucinations  . Vicodin [Hydrocodone-Acetaminophen] Other (See Comments)    hallucinations    Outpatient Encounter Medications as of 02/09/2021  Medication Sig Note  . allopurinol (ZYLOPRIM) 300 MG tablet TAKE 1 TABLET(300 MG) BY MOUTH DAILY   . aspirin EC 81 MG tablet Take 1 tablet (81 mg total) by mouth daily.   Marland Kitchen atenolol-chlorthalidone (TENORETIC) 50-25 MG tablet TAKE 1 TABLET BY MOUTH EVERY DAY AT 11 AM   . benazepril (LOTENSIN) 10 MG tablet Take 10 mg by mouth daily.   . cephALEXin (KEFLEX) 250 MG capsule Take 1 capsule (250 mg total) by  mouth 4 (four) times daily for 10 days.   . Cholecalciferol (VITAMIN D3) 1.25 MG (50000 UT) CAPS Take 1 capsule by mouth on Tuesday and Friday   . Continuous Blood Gluc Sensor (FREESTYLE LIBRE 2 SENSOR) MISC Use to check blood sugar. Dx code e11.65 (Patient not taking: No sig reported)   . Cyanocobalamin (VITAMIN B12 PO) Take by mouth.   . Dulaglutide (TRULICITY) 1.19 ER/7.4YC SOPN INJECT 0.5 ML(0.75 MG) SUBCUTANEOUSLY EVERY WEEK IN ABDOMEN, THIGH OR UPPER ARM ROTATING INJECTION SITE 11/04/2020: Per endocrinologist patient to take trulicity every other week.   . hydrOXYzine (ATARAX/VISTARIL) 10 MG tablet Take 1 tablet (10 mg total) by mouth 3 (three) times daily as needed.   . Lancets (ONETOUCH DELICA PLUS [id number]) MISC USE TO CHECK BLOOD SUGAR TWICE DAILY   . Magnesium 250 MG TABS Take 1 tablet (250 mg total) by mouth every evening. Take with dinner meal   . Multiple Vitamins-Minerals (ZINC PO) Take 1 tablet by mouth daily.    [name] test strip USE TO TEST 2 TIMES DAILY   . Probiotic Product (PROBIOTIC PO) Take 1 capsule by mouth daily.    . rosuvastatin (CRESTOR) 20 MG tablet TAKE 1 TABLET(20 MG) BY MOUTH DAILY    No facility-administered encounter medications on file as of 02/09/2021.    Patient Active Problem List   Diagnosis Date Noted  . 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 Aortic atherosclerosis ([place]) 01/03/2021  . Class 2 severe obesity due to excess calories with serious comorbidity and body mass index (BMI) of 39.0 to 39.9 in adult [facility])  01/03/2021  . Pruritus 01/03/2021  . Vitamin D deficiency 06/01/2020  . CAP (community acquired pneumonia) 05/06/2020  . Abdominal bloating 02/17/2019  . Urinary frequency 02/17/2019  . Uncontrolled type 2 diabetes mellitus with hyperglycemia ([place]) 02/17/2019  . Lightheaded 02/17/2019  . Constipation 06/26/2018  . Hyperlipidemia 08/23/2014  . Type 2 diabetes mellitus with stage 3 chronic kidney disease, without long-term current use  of insulin ([place]) 06/19/2014  . Mixed hyperlipidemia 06/19/2014  . S/P urological surgery 09/30/2013  . Joint pain 09/30/2013  . UTI (urinary tract infection) 09/30/2013  . Cough 12/10/2012  . OA (osteoarthritis) of knee 01/05/2012  . 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 Vaginal discharge 08/02/2011  . Spasm of lumbar paraspinous muscle 06/17/2011  . Morbidly obese ([place]) 05/31/2011  . HIP PAIN, LEFT, CHRONIC 11/09/2010  . CARPAL TUNNEL SYNDROME, RIGHT 10/14/2010  . KNEE PAIN, RIGHT, CHRONIC 10/14/2010  . LEFT BUNDLE BRANCH BLOCK 07/26/2010  . 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 FATIGUE 07/01/2010  . CHEST PAIN, ATYPICAL 07/01/2010  . EPIGASTRIC PAIN 07/01/2010  . SUPRAPUBIC PAIN 07/01/2010  . HYPERGLYCEMIA 07/01/2010  . CELLULITIS AND ABSCESS OF OTHER SPECIFIED SITE 02/15/2010  . LUMP OR MASS IN BREAST 01/10/2010  . DYSURIA, CHRONIC 03/09/2009  . GERD 10/06/2008  . Personal history of colonic polyps-adenoma 08/26/2008  . LOC OSTEOARTHROS NOT SPEC PRIM/SEC LOWER LEG 09/10/2007  . GOUT, UNSPECIFIED 09/09/2007  . DIVERTICULOSIS, COLON 09/09/2007  . DIVERTICULITIS, HX OF 09/09/2007  . HYPERLIPIDEMIA 06/03/2007  . Essential hypertension 06/03/2007    Conditions to be addressed/monitored: HTN, DMII and CKD Stage III; Limited knowledge of health plan benefits  Care Plan : Social Work Care Plan  Updates made by [name] since 02/09/2021 12:00 AM    Problem: Care Coordination     Goal: Access health plan benefits   Start Date: 10/01/2020  Expected End Date: 12/11/2020  Recent Progress: On track  Priority: High  Note:   Current Barriers:  . Financial constraints related to cost of incontinent supplies . Lacks knowledge of health plan benefit . Chronic conditions including DM II, HTN, and CKD III which put patient at increased risk of hospitalization  Social Work Clinical Goal(s):  Marland Kitchen Over the next 45 days the patient will work with SW to obtain  incontinent supplies from over the counter benefit . Over the next 70 days the patient will work with SW to become more knowledgeable of health plan benefit  CCM SW Interventions: . 1:1 collaboration with [name], MD regarding development and update of comprehensive plan of care as evidenced by provider attestation and co-signature . Inter-disciplinary care team collaboration (see longitudinal plan of care) . Successful outbound call placed to the patient to assess goal progression . Determined the patient did attempt to contact [facility] on her own regarding a bariatric BP cuff and was on hold for over an hour to confirm benefits and copay amount - patient hung up without getting answers . Advised the patient SW would request a prescription be sent in by her provider to [facility] . Collaboration with Michelle Nasuti CMA who confirms she will work with the patients provider to obtain a prescription to send for a bariatric automatic cuff . Discussed the patient would like to obtain glasses under her health plan benefit - she has contacted [facility] and is awaiting information on in network providers . Scheduled follow up call over the next two weeks Patient Goals/Self-Care Activities Over the next 14 days, patient will:   -  Self administers medications as prescribed -Attend all scheduled provider appointments -Contact SW as needed prior to next scheduled call   Follow up Plan:  SW will contact the patient on 4.22.22       Follow Up Plan: SW will follow up with patient by phone over the next two weeks      [name], BSW, CDP Social Worker, Certified Dementia Practitioner [place] [facility] [phone]  Total time spent performing care coordination and/or care management activities with the patient by phone or face to face = 20 minutes.

## 2021-02-11 ENCOUNTER — Telehealth: Payer: Self-pay

## 2021-02-11 NOTE — Telephone Encounter (Signed)
Patient called requesting help with patient assistance. Spoke with Ms. Elgie Congo, [employer],to be sent to office for storage purposes. Will change and update address: [employer] , [address], [place], [place] [address].  Total Time: 11 minutes [name], PharmD Clinical Pharmacist [facility] [phone]

## 2021-02-17 ENCOUNTER — Telehealth: Payer: Self-pay

## 2021-02-17 ENCOUNTER — Telehealth: Payer: Self-pay | Admitting: Nurse Practitioner

## 2021-02-17 NOTE — Telephone Encounter (Signed)
Called patient to talk about her eye exam, unable to leave voicemail

## 2021-02-17 NOTE — Chronic Care Management (AMB) (Signed)
Chronic Care Management Pharmacy Assistant   Name: [name]  MRN: [id number] DOB: 08-Sep-1941   Reason for Encounter: Disease State-DM   Recent office visits:  02/08/21- Cheryl Brine, FNP(PCP) 02/01/21-Cheryl Laurance Flatten, FNP(PCP)  Recent consult visits:  None noted  Hospital visits:  Medication Reconciliation was completed by comparing discharge summary, patient's EMR and Pharmacy list, and upon discussion with patient.  Admitted to the hospital on 08/28/20 due to Acute Cystitis. Discharge date was 08/28/20. Discharged from [facility]?Medications Started at [facility] Discharge:?? -started Cephalexin 500 mg bid due to Acute Cystitis  Medication Changes at Hospital Discharge: -Changed none  Medications Discontinued at Hospital Discharge: -Stopped none  Medications that remain the same after Hospital Discharge:??  -All other medications will remain the same.    Medications: Outpatient Encounter Medications as of 02/17/2021  Medication Sig Note  . allopurinol (ZYLOPRIM) 300 MG tablet TAKE 1 TABLET(300 MG) BY MOUTH DAILY   . aspirin EC 81 MG tablet Take 1 tablet (81 mg total) by mouth daily.   Marland Kitchen atenolol-chlorthalidone (TENORETIC) 50-25 MG tablet TAKE 1 TABLET BY MOUTH EVERY DAY AT 11 AM   . benazepril (LOTENSIN) 10 MG tablet Take 10 mg by mouth daily.   . Cholecalciferol (VITAMIN D3) 1.25 MG (50000 UT) CAPS Take 1 capsule by mouth on Tuesday and Friday   . Continuous Blood Gluc Sensor (FREESTYLE LIBRE 2 SENSOR) MISC Use to check blood sugar. Dx code e11.65 (Patient not taking: No sig reported)   . Cyanocobalamin (VITAMIN B12 PO) Take by mouth.   . Dulaglutide (TRULICITY) 6.72 CN/4.7SJ SOPN INJECT 0.5 ML(0.75 MG) SUBCUTANEOUSLY EVERY WEEK IN ABDOMEN, THIGH OR UPPER ARM ROTATING INJECTION SITE 11/04/2020: Per endocrinologist patient to take trulicity every other week.   . hydrOXYzine (ATARAX/VISTARIL) 10 MG tablet Take 1 tablet (10 mg total) by mouth 3  (three) times daily as needed.   . Lancets (ONETOUCH DELICA PLUS [id number]) MISC USE TO CHECK BLOOD SUGAR TWICE DAILY   . Magnesium 250 MG TABS Take 1 tablet (250 mg total) by mouth every evening. Take with dinner meal   . Multiple Vitamins-Minerals (ZINC PO) Take 1 tablet by mouth daily.    [name] VERIO test strip USE TO TEST 2 TIMES DAILY   . Probiotic Product (PROBIOTIC PO) Take 1 capsule by mouth daily.    . rosuvastatin (CRESTOR) 20 MG tablet TAKE 1 TABLET(20 MG) BY MOUTH DAILY    No facility-administered encounter medications on file as of 02/17/2021.    Recent Relevant Labs: Lab Results  Component Value Date/Time   HGBA1C 6.6 (H) 12/30/2020 01:00 PM   HGBA1C 6.3 (H) 09/01/2020 03:20 PM   MICROALBUR 10 03/19/2019 02:04 PM    Kidney Function Lab Results  Component Value Date/Time   CREATININE 1.10 (H) 12/30/2020 01:00 PM   CREATININE 1.21 (H) 08/28/2020 06:58 PM   GFR 53.18 (L) 06/19/2014 03:42 PM   GFRNONAA 46 (L) 08/28/2020 06:58 PM   GFRAA 54 (L) 06/01/2020 04:09 PM    . Current antihyperglycemic regimen:  o Trulicity 7.65 mg inject 0.5 ml once a week  . What recent interventions/DTPs have been made to improve glycemic control:  o None noted  . Have there been any recent hospitalizations or ED visits since last visit with CPP? No   . Patient denies hypoglycemic symptoms, including Pale, Sweaty, Shaky, Hungry, Nervous/irritable and Vision changes   . Patient denies hyperglycemic symptoms, including blurry vision, excessive thirst, fatigue, polyuria and  weakness   . How often are you checking your blood sugar?  o Patient states she checks her blood sugars every other day.  . What are your blood sugars ranging?  o Fasting: 98-119 o Before meals: na o After meals: na o Bedtime: na  . During the week, how often does your blood glucose drop below 70? Never   . Are you checking your feet daily/regularly?  o Patient states she checks her feet  everyday.  Adherence Review: Is the patient currently on a STATIN medication? Yes Is the patient currently on ACE/ARB medication? Yes Does the patient have >5 day gap between last estimated fill dates? No   Star Rating Drugs: Trulicity 0.75mg /0.62ml last filled 02/07/19 -Patient assistance Benazepril 10 mg last filled 01/24/21 30 DS Rosuvastatin 20 mg last filled 12/28/20 90 DS   I have rescheduled patients appointment from 02/22/21 at 3pm to 03/10/21 at 11:00AM  [place] Pharmacist Assistant [phone]

## 2021-02-18 ENCOUNTER — Other Ambulatory Visit: Payer: Self-pay

## 2021-02-18 ENCOUNTER — Encounter (HOSPITAL_BASED_OUTPATIENT_CLINIC_OR_DEPARTMENT_OTHER): Payer: Self-pay | Admitting: *Deleted

## 2021-02-18 ENCOUNTER — Ambulatory Visit (INDEPENDENT_AMBULATORY_CARE_PROVIDER_SITE_OTHER): Payer: Medicare Other

## 2021-02-18 ENCOUNTER — Emergency Department (HOSPITAL_BASED_OUTPATIENT_CLINIC_OR_DEPARTMENT_OTHER)
Admission: EM | Admit: 2021-02-18 | Discharge: 2021-02-18 | Disposition: A | Discharge status: Home / Self Care | Payer: Medicare Other | Attending: Emergency Medicine | Admitting: Emergency Medicine

## 2021-02-18 ENCOUNTER — Telehealth: Payer: Medicare Other

## 2021-02-18 ENCOUNTER — Emergency Department (HOSPITAL_BASED_OUTPATIENT_CLINIC_OR_DEPARTMENT_OTHER): Payer: Medicare Other

## 2021-02-18 ENCOUNTER — Telehealth: Payer: Self-pay

## 2021-02-18 ENCOUNTER — Ambulatory Visit
Admission: EM | Admit: 2021-02-18 | Discharge: 2021-02-18 | Disposition: A | Discharge status: Home / Self Care | Payer: Medicare Other | Source: Home / Self Care

## 2021-02-18 DIAGNOSIS — M25551 Pain in right hip: Secondary | ICD-10-CM

## 2021-02-18 DIAGNOSIS — I129 Hypertensive chronic kidney disease with stage 1 through stage 4 chronic kidney disease, or unspecified chronic kidney disease: Secondary | ICD-10-CM | POA: Insufficient documentation

## 2021-02-18 DIAGNOSIS — Z7982 Long term (current) use of aspirin: Secondary | ICD-10-CM | POA: Insufficient documentation

## 2021-02-18 DIAGNOSIS — K219 Gastro-esophageal reflux disease without esophagitis: Secondary | ICD-10-CM | POA: Insufficient documentation

## 2021-02-18 DIAGNOSIS — N183 Chronic kidney disease, stage 3 unspecified: Secondary | ICD-10-CM | POA: Diagnosis not present

## 2021-02-18 DIAGNOSIS — Z7984 Long term (current) use of oral hypoglycemic drugs: Secondary | ICD-10-CM | POA: Insufficient documentation

## 2021-02-18 DIAGNOSIS — R1031 Right lower quadrant pain: Secondary | ICD-10-CM | POA: Insufficient documentation

## 2021-02-18 DIAGNOSIS — Z79899 Other long term (current) drug therapy: Secondary | ICD-10-CM | POA: Insufficient documentation

## 2021-02-18 DIAGNOSIS — R109 Unspecified abdominal pain: Secondary | ICD-10-CM | POA: Diagnosis not present

## 2021-02-18 DIAGNOSIS — Z9049 Acquired absence of other specified parts of digestive tract: Secondary | ICD-10-CM

## 2021-02-18 DIAGNOSIS — E1122 Type 2 diabetes mellitus with diabetic chronic kidney disease: Secondary | ICD-10-CM | POA: Diagnosis not present

## 2021-02-18 DIAGNOSIS — Z96653 Presence of artificial knee joint, bilateral: Secondary | ICD-10-CM | POA: Insufficient documentation

## 2021-02-18 DIAGNOSIS — K5900 Constipation, unspecified: Secondary | ICD-10-CM | POA: Diagnosis not present

## 2021-02-18 LAB — COMPREHENSIVE METABOLIC PANEL
ALT: 25 U/L (ref 0–44)
AST: 30 U/L (ref 15–41)
Albumin: 3.9 g/dL (ref 3.5–5.0)
Alkaline Phosphatase: 36 U/L — ABNORMAL LOW (ref 38–126)
Anion gap: 10 (ref 5–15)
BUN: 23 mg/dL (ref 8–23)
CO2: 27 mmol/L (ref 22–32)
Calcium: 9.3 mg/dL (ref 8.9–10.3)
Chloride: 103 mmol/L (ref 98–111)
Creatinine, Ser: 0.98 mg/dL (ref 0.44–1.00)
GFR, Estimated: 59 mL/min — ABNORMAL LOW (ref >60)
Glucose, Bld: 157 mg/dL — ABNORMAL HIGH (ref 70–99)
Potassium: 3.5 mmol/L (ref 3.5–5.1)
Sodium: 140 mmol/L (ref 135–145)
Total Bilirubin: 1 mg/dL (ref 0.3–1.2)
Total Protein: 6.9 g/dL (ref 6.5–8.1)

## 2021-02-18 LAB — CBC WITH DIFFERENTIAL/PLATELET
Abs Immature Granulocytes: 0.05 10*3/uL (ref 0.00–0.07)
Basophils Absolute: 0.1 10*3/uL (ref 0.0–0.1)
Basophils Relative: 1 %
Eosinophils Absolute: 0.3 10*3/uL (ref 0.0–0.5)
Eosinophils Relative: 3 %
HCT: 41.7 % (ref 36.0–46.0)
Hemoglobin: 13.8 g/dL (ref 12.0–15.0)
Immature Granulocytes: 1 %
Lymphocytes Relative: 28 %
Lymphs Abs: 2.8 10*3/uL (ref 0.7–4.0)
MCH: 29.9 pg (ref 26.0–34.0)
MCHC: 33.1 g/dL (ref 30.0–36.0)
MCV: 90.3 fL (ref 80.0–100.0)
Monocytes Absolute: 0.5 10*3/uL (ref 0.1–1.0)
Monocytes Relative: 5 %
Neutro Abs: 6.2 10*3/uL (ref 1.7–7.7)
Neutrophils Relative %: 62 %
Platelets: 215 10*3/uL (ref 150–400)
RBC: 4.62 MIL/uL (ref 3.87–5.11)
RDW: 14.2 % (ref 11.5–15.5)
WBC: 10 10*3/uL (ref 4.0–10.5)
nRBC: 0 % (ref 0.0–0.2)

## 2021-02-18 LAB — POCT URINALYSIS DIP (MANUAL ENTRY)
Bilirubin, UA: NEGATIVE
Blood, UA: NEGATIVE
Glucose, UA: NEGATIVE mg/dL
Ketones, POC UA: NEGATIVE mg/dL
Nitrite, UA: NEGATIVE
Protein Ur, POC: NEGATIVE mg/dL
Spec Grav, UA: 1.02 (ref 1.010–1.025)
Urobilinogen, UA: 0.2 E.U./dL
pH, UA: 6 (ref 5.0–8.0)

## 2021-02-18 LAB — LIPASE, BLOOD: Lipase: 19 U/L (ref 11–51)

## 2021-02-18 IMAGING — DX DG ABDOMEN 2V
3 series · 3 of 3 positions shown · non-contrast
Comparison: CT abdomen and pelvis [DATE]

CLINICAL DATA: RIGHT lower quadrant abdominal pain and constipation
for 3 days, pressure-like sensation to RIGHT hip, pain radiating
around from back to RIGHT lower quadrant

EXAM:
ABDOMEN - 2 VIEW

[abdomen standing ap]
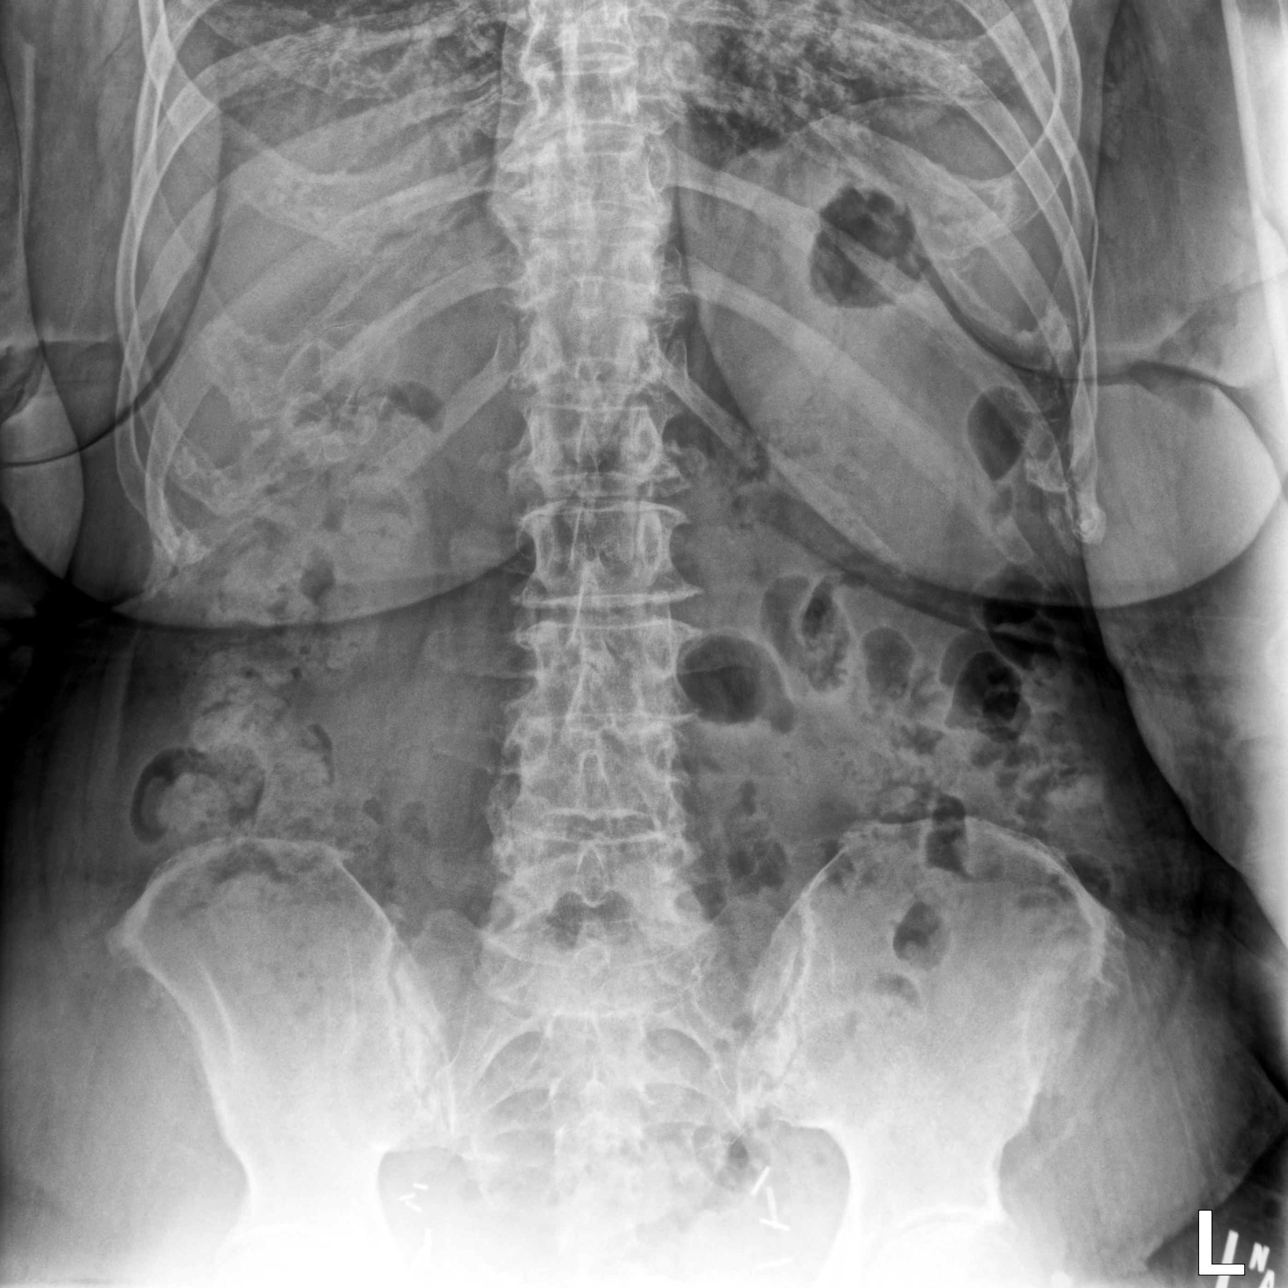

[abdomen supine ap (1 of 2)]
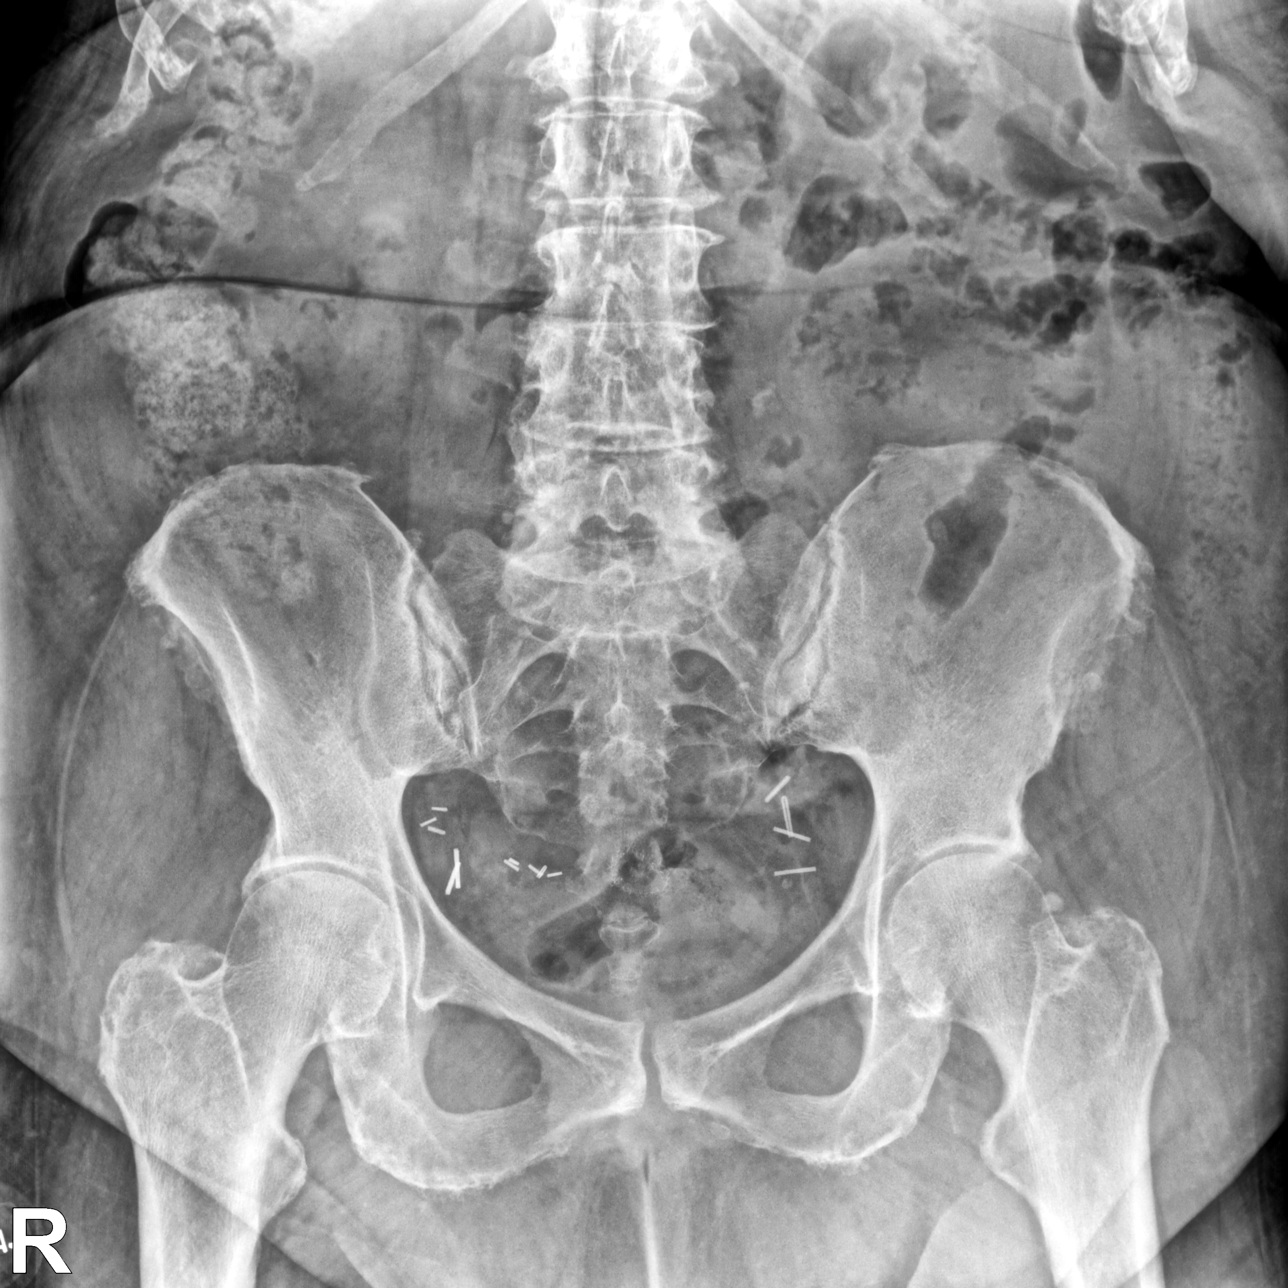

[abdomen supine ap (2 of 2)]
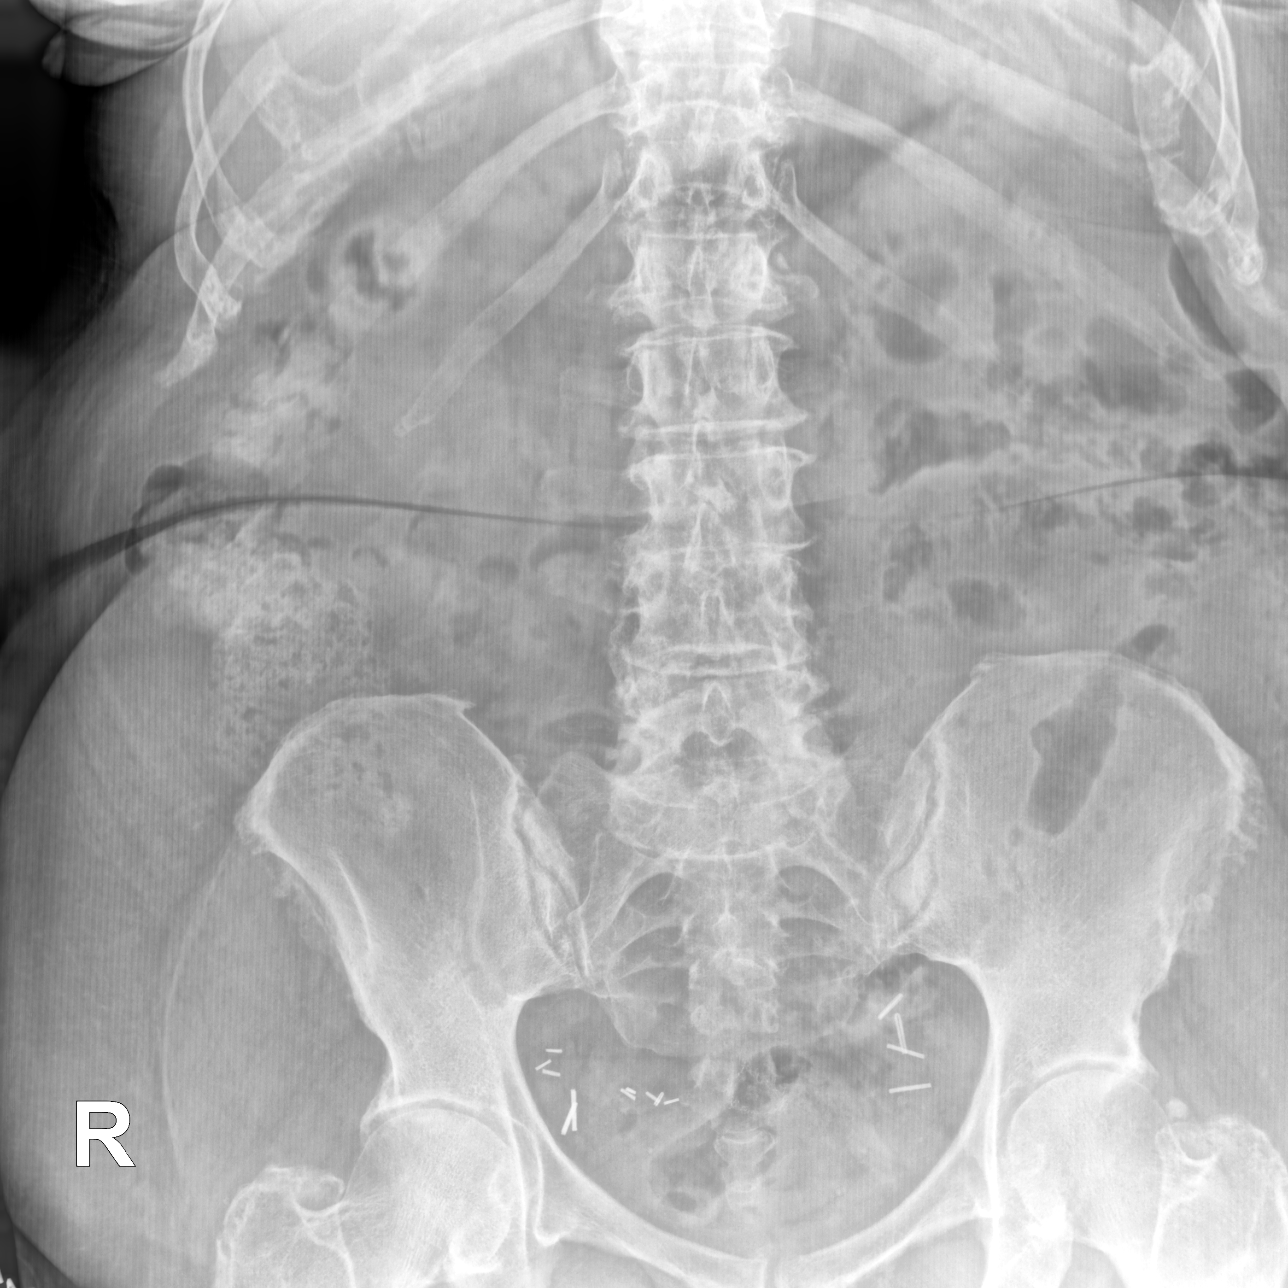

[3 of 3 positions shown; findings below may reference images not displayed]

FINDINGS: Nonobstructive bowel gas pattern.

Scattered stool in colon, normal stool burden.

No bowel dilatation, bowel wall thickening or free air.

LEFT paraspinal calcification at L4, 10 mm diameter seen only on 1,
corresponding to anterior abdominal wall calcification on CT series
2, image 48.

No definite RIGHT-sided urinary tract calcifications seen.

Surgical clips in pelvis as well as anastomotic staple line.

Degenerative disc disease changes lumbar spine.
IMPRESSION: No acute abnormalities.

## 2021-02-18 IMAGING — CT CT ABD-PELV W/ CM
2 of 5 series · 16 of 46 positions shown, 18 images · IV contrast (APPLIED)
Comparison: [DATE]

CLINICAL DATA: Right lower quadrant abdominal pain. Question
diverticulitis. Previous bowel resection.

EXAM:
CT ABDOMEN AND PELVIS WITH CONTRAST
TECHNIQUE: Multidetector CT imaging of the abdomen and pelvis was performed
using the standard protocol following bolus administration of
intravenous contrast.
CONTRAST:  100mL OMNIPAQUE IOHEXOL 300 MG/ML  SOLN

[Series 2: abd pel w · axial · 0.95mm/px · z∈[+1110,+1560]mm · 13 of 102 slices shown, 15 images]
[im 6/102  soft-tissue]
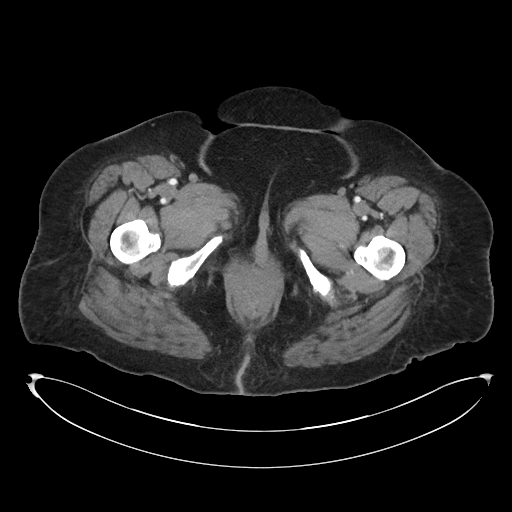
[im 6/102  bone]
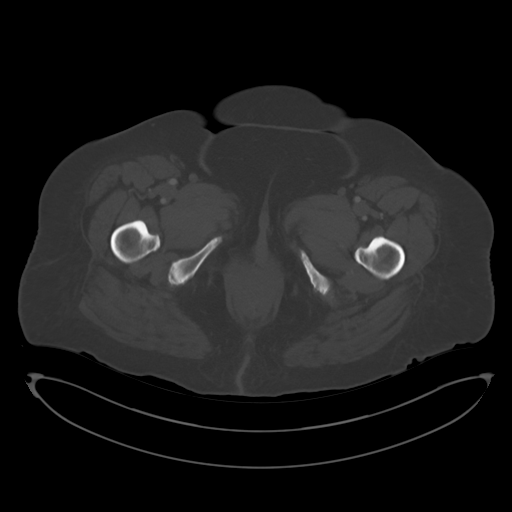
[im 12/102  soft-tissue]
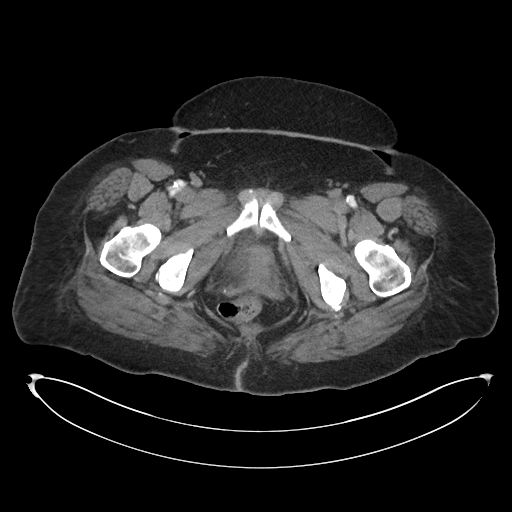
[im 23/102  soft-tissue]
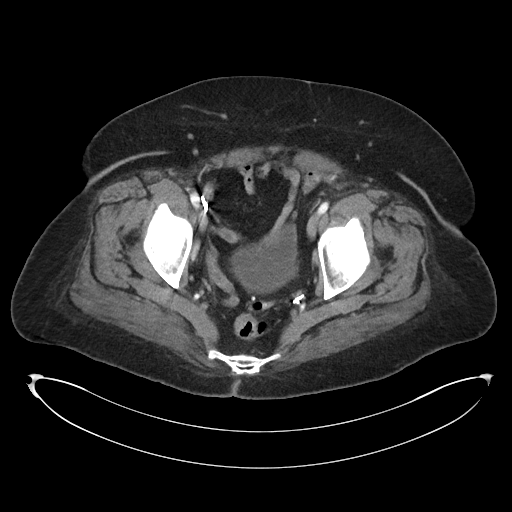
[im 29/102  soft-tissue]
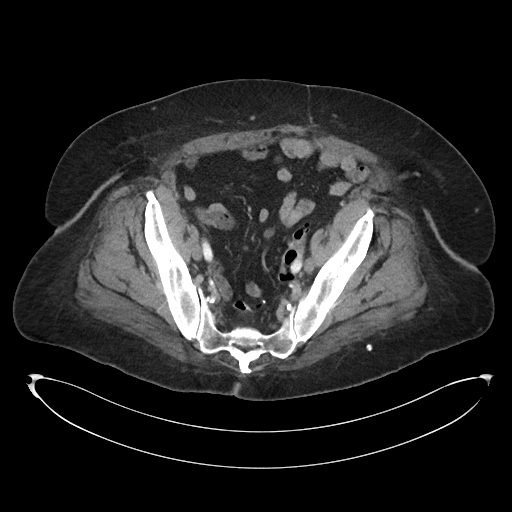
[im 34/102  soft-tissue]
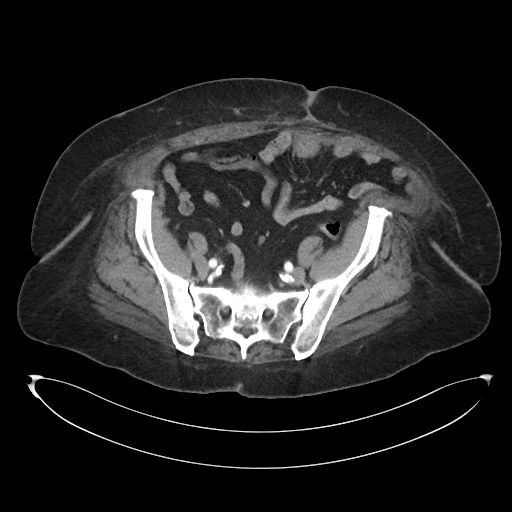
[im 45/102  soft-tissue]
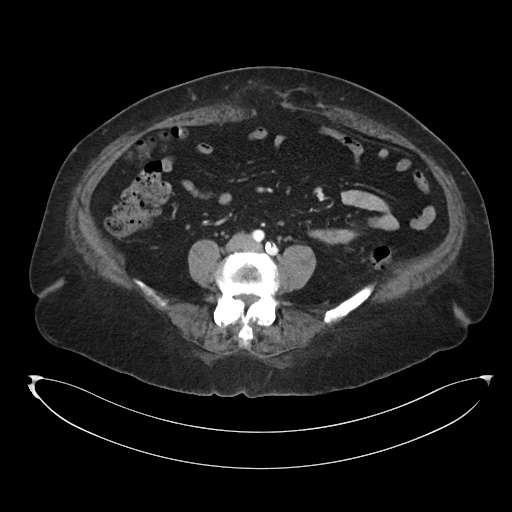
[im 51/102  soft-tissue]
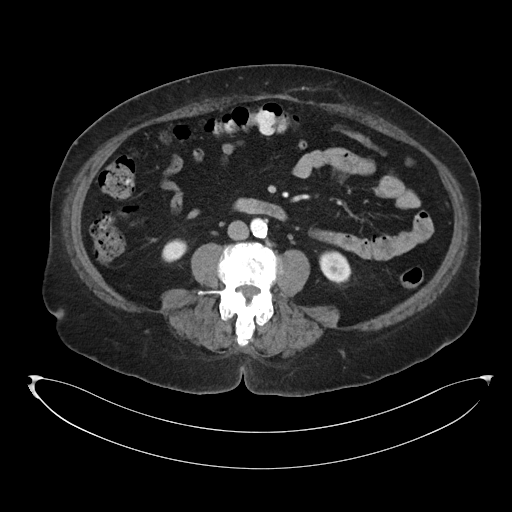
[im 57/102  soft-tissue]
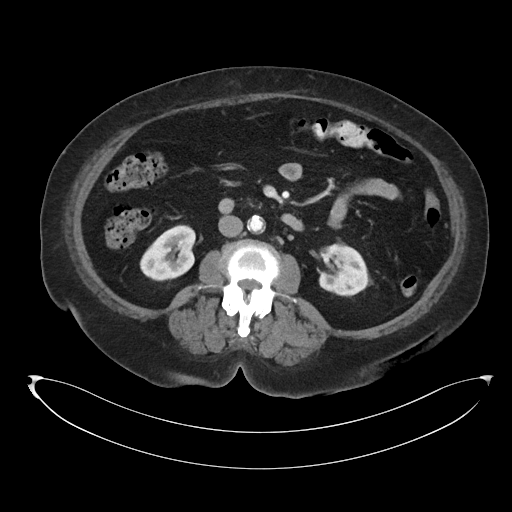
[im 68/102  soft-tissue]
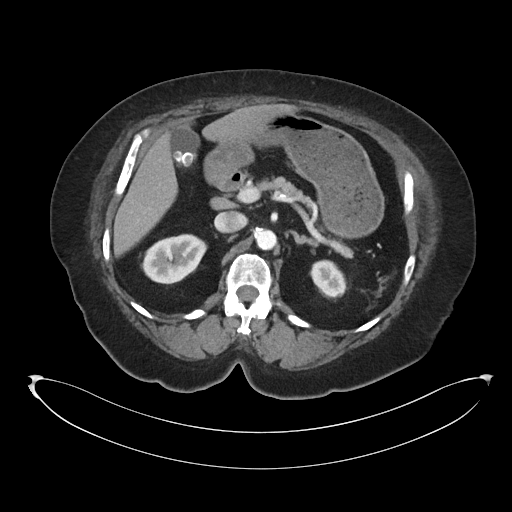
[im 68/102  bone]
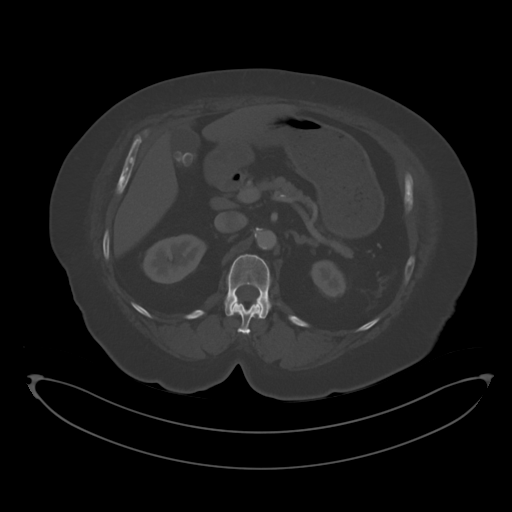
[im 73/102  soft-tissue]
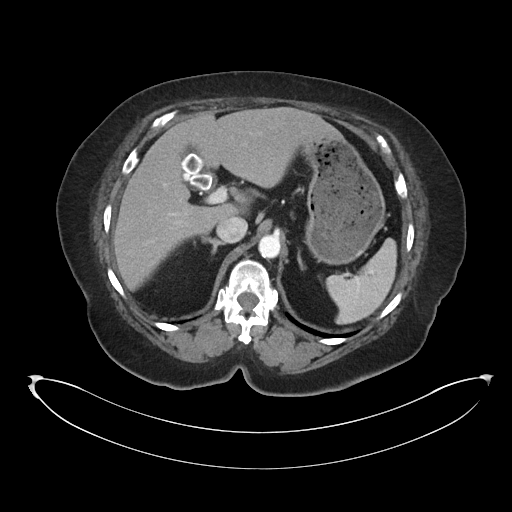
[im 79/102  soft-tissue]
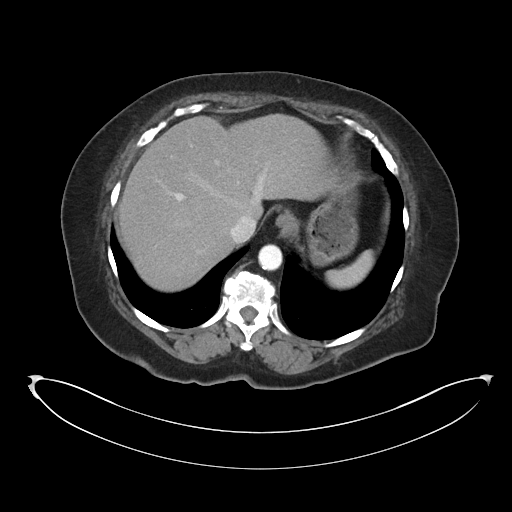
[im 90/102  soft-tissue]
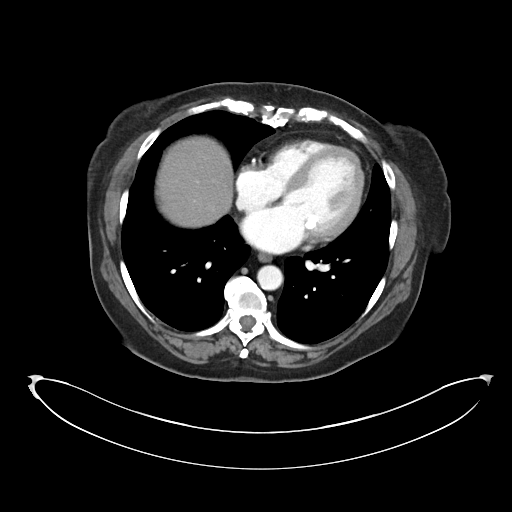
[im 96/102  soft-tissue]
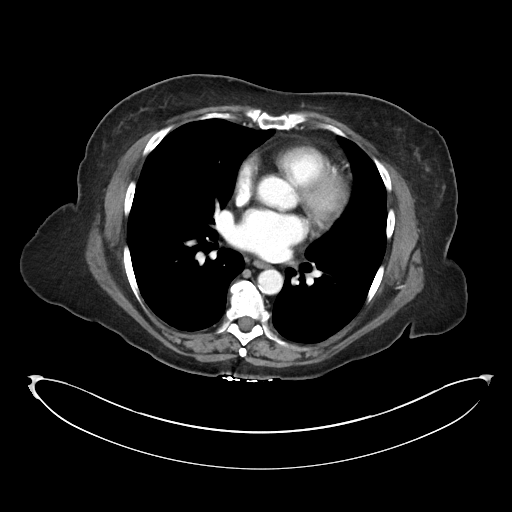

[Series 5: coronal · coronal · 0.91mm/px · 3 of 113 slices shown]
[im 38/113  soft-tissue]
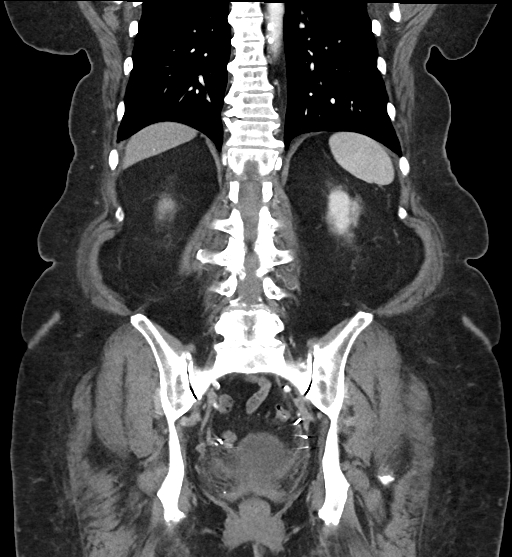
[im 50/113  soft-tissue]
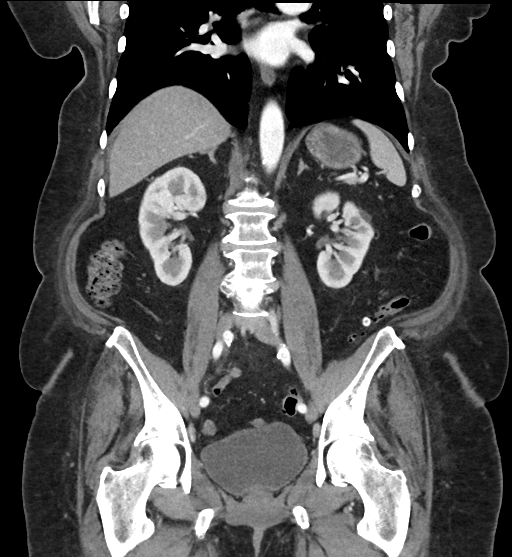
[im 63/113  soft-tissue]
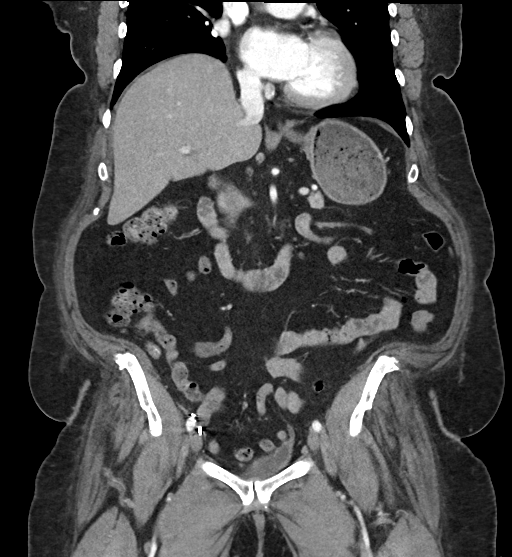

[16 of 46 positions shown; findings below may reference images not displayed]

FINDINGS: Lower chest: Normal

Hepatobiliary: Mild fatty change of the liver. Multiple calcified
gallstones within the gallbladder. No CT evidence of cholecystitis
or obstruction.

Pancreas: Normal

Spleen: Normal

Adrenals/Urinary Tract: Adrenal glands are normal. Few small
cortical low-density abnormalities of the kidneys. At the upper pole
on the right, there is an enlarging focus measuring 13 mm in size
which was not present at all on the study [DATE]. This
could represent a small developing cortical mass. MRI with and
without contrast is recommended. No bladder abnormality is seen.

Stomach/Bowel: Stomach is normal. No small bowel abnormality. The
appendix is normal. There is diverticulosis of the left colon but
without evidence of active diverticulitis presently. Previous bowel
resection in the rectal region with anastomotic sutures.

Vascular/Lymphatic: Aortic atherosclerosis. No aneurysm. IVC is
normal. No retroperitoneal adenopathy.

Reproductive: Previous hysterectomy.  No pelvic mass.

Other: Multiple areas of ventral fat herniation, similar to the
previous exams. No evidence of herniated bowel.

Musculoskeletal: Lower lumbar degenerative changes. No acute
musculoskeletal finding.
IMPRESSION: 1. No acute finding to explain right lower quadrant pain. The
appendix is normal. Diverticulosis of the left colon but without
evidence of active diverticulitis presently.
2. Multiple areas of ventral fat herniation, similar to the previous
exams. No evidence of herniated bowel.
3. Mild fatty change of the liver.
4. Cholelithiasis without CT evidence of cholecystitis or
obstruction.
5. 13 mm enlarging focus at the upper pole of the right kidney. This
could represent a small developing cortical mass. MRI with and
without contrast is recommended electively.
6. Aortic atherosclerosis.

Aortic Atherosclerosis ([WQ]-[WQ]).

## 2021-02-18 MED ORDER — IOHEXOL 300 MG/ML  SOLN
100.0000 mL | Freq: Once | INTRAMUSCULAR | Status: AC | PRN
  Administered 2021-02-18: 100 mL via INTRAVENOUS

## 2021-02-18 NOTE — ED Triage Notes (Signed)
Pt c/o RLQ pain and feels like pressure to rt hip.C/o pain radiating from back around to RLQ. States hx of diverticulosis and this feels similar. Last NBM 2 days ago. Marland Kitchen

## 2021-02-18 NOTE — Telephone Encounter (Signed)
  Chronic Care Management   Outreach Note  02/18/2021 Name: Zanylah Hardie MRN: [id number] DOB: 09-04-1941  Referred by: [name], MD Reason for referral : Care Coordination   Rescheduled patients SW follow up appointment due to patient being in ED for abdominal pain.  Follow Up Plan: SW will follow up with the patient over the next 14 days.  [name], BSW, CDP Social Worker, Certified Dementia Practitioner [place] [facility] [phone]

## 2021-02-18 NOTE — ED Notes (Signed)
ED Provider at bedside. 

## 2021-02-18 NOTE — Discharge Instructions (Signed)
[facility] [address], [place], [place] [address]  

## 2021-02-18 NOTE — ED Triage Notes (Signed)
Compliant of RLQ radiating to her right back for 2 days.  Pt stated that she has a history of diverticulosis and the pain is similar.

## 2021-02-18 NOTE — ED Notes (Signed)
Patient transported to CT 

## 2021-02-18 NOTE — ED Notes (Signed)
Pt ambulatory with steady gait to restroom. Pt will provide urine specimen 

## 2021-02-18 NOTE — ED Provider Notes (Signed)
[facility] EMERGENCY DEPT Provider Note   CSN: [id number] Arrival date & time: 02/18/21  1429     History Chief Complaint  Patient presents with  . Abdominal Pain    [name] is a 80 y.o. female.  80 year old female with past medical history below including diverticulitis status post bowel resection, GERD, HTN, HLD who p/w abd pain.  Patient presents with right lower abdominal pain that began 4 days ago and has intermittently been present since that time.  Pain is worse with movement and sometimes radiates to her right back.  She reports distant history of diverticulitis 15 to 20 years ago that felt similar but she has not had problems since then.  She denies any change in bowel movements, has chronic constipation but has had no diarrhea lately.  Several weeks ago she was having some dark stools but ultimately this was attributed to elderberry supplement as stools returned to normal when she stopped the supplement.  And days ago, she saw her PCP for a boil in her groin which was already spontaneously draining.  She was given a course of Keflex which she completed and states that symptoms resolved.  She denies any fevers, nausea, vomiting, urinary symptoms.  She has tried Pepto-Bismol and Gas-X with only temporary relief.  She went to urgent care today where urine was reportedly normal but she was sent here for further evaluation.  The history is provided by the patient.  Abdominal Pain      Past Medical History:  Diagnosis Date  . Abdominal pain   . Arthritis    cervical disc degeneration/ oa left knee, carpal tunnel rt wrist, adhesive capsulitis right shoulder, rt hand weakness; lumbar degeneration  . Carpal tunnel syndrome   . 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 Constipation   . Diabetes  mellitus without complication ([place])    borderline - diet control  . Diverticulitis hx of  . Diverticulosis   . E. coli infection    2020  . Frequent UTI    hx of urethral injury during colon surgery - states frequent uti's since  . GERD (gastroesophageal reflux disease)   . H/O hiatal hernia   . History of palpitations    in the past  . History of shingles    has a lingering itching on back where shingles were  . Hyperlipidemia   . Hypertension   . Numbness and tingling in right hand    pt. states has numbness of right hand very frequently-watch positioning  . Obesity   . Osteoarthritis   . Osteoporosis   . Pain    pain left knee and pain right hip and right groin  . Personal history of colonic polyps-adenoma 08/26/2008  . Pneumonia 2005  . Vitamin D deficiency     Patient Active Problem List   Diagnosis Date Noted  . 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 Aortic atherosclerosis ([place]) 01/03/2021  . Class 2 severe obesity due to excess calories with serious comorbidity and body mass index (BMI) of 39.0 to 39.9 in adult ([place]) 01/03/2021  . Pruritus 01/03/2021  . Vitamin D deficiency 06/01/2020  . CAP (community acquired pneumonia) 05/06/2020  . Abdominal bloating 02/17/2019  . Urinary frequency 02/17/2019  . Uncontrolled type 2 diabetes mellitus with hyperglycemia ([place]) 02/17/2019  . Lightheaded 02/17/2019  .  Constipation 06/26/2018  . Hyperlipidemia 08/23/2014  . Type 2 diabetes mellitus with stage 3 chronic kidney disease, without long-term current use of insulin ([place]) 06/19/2014  . Mixed hyperlipidemia 06/19/2014  . S/P urological surgery 09/30/2013  . Joint pain 09/30/2013  . UTI (urinary tract infection) 09/30/2013  . Cough 12/10/2012  . OA (osteoarthritis) of knee 01/05/2012  . 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 Vaginal discharge 08/02/2011  . Spasm of lumbar paraspinous muscle 06/17/2011  . Morbidly obese ([place]) 05/31/2011  . HIP PAIN,  LEFT, CHRONIC 11/09/2010  . CARPAL TUNNEL SYNDROME, RIGHT 10/14/2010  . KNEE PAIN, RIGHT, CHRONIC 10/14/2010  . LEFT BUNDLE BRANCH BLOCK 07/26/2010  . 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 FATIGUE 07/01/2010  . CHEST PAIN, ATYPICAL 07/01/2010  . EPIGASTRIC PAIN 07/01/2010  . SUPRAPUBIC PAIN 07/01/2010  . HYPERGLYCEMIA 07/01/2010  . CELLULITIS AND ABSCESS OF OTHER SPECIFIED SITE 02/15/2010  . LUMP OR MASS IN BREAST 01/10/2010  . DYSURIA, CHRONIC 03/09/2009  . GERD 10/06/2008  . Personal history of colonic polyps-adenoma 08/26/2008  . LOC OSTEOARTHROS NOT SPEC PRIM/SEC LOWER LEG 09/10/2007  . GOUT, UNSPECIFIED 09/09/2007  . DIVERTICULOSIS, COLON 09/09/2007  . DIVERTICULITIS, HX OF 09/09/2007  . HYPERLIPIDEMIA 06/03/2007  . Essential hypertension 06/03/2007    Past Surgical History:  Procedure Laterality Date  . ABDOMINAL HYSTERECTOMY    . bladder tack    . BREAST BIOPSY Left   . BREAST SURGERY     breast duct resection- benign  . CARPAL TUNNEL RELEASE Right 06/04/2014   Procedure: RIGHT CARPAL TUNNEL RELEASE;  Surgeon: Roseanne Kaufman, MD;  Location: [facility];  Service: Orthopedics;  Laterality: Right;  . COLON RESECTION  2008   hx diverticulosis  . COLON SURGERY    . COLONOSCOPY    . colonoscopy [id number]    . JOINT REPLACEMENT    . OOPHORECTOMY    . POLYPECTOMY    . skin graft left arm - traumatic compression injury left upper arm    . temporary ureter stent    . TOTAL KNEE ARTHROPLASTY  01/05/2012   Procedure: TOTAL KNEE ARTHROPLASTY;  Surgeon: [name], MD;  Location: [facility];  Service: Orthopedics;  Laterality: Right;  . TOTAL KNEE ARTHROPLASTY Left 08/17/2014   Procedure: LEFT TOTAL KNEE ARTHROPLASTY;  Surgeon: [name], MD;  Location: [facility];  Service: Orthopedics;  Laterality: Left;  . ureter repair for tyransected left ureter       OB History   No obstetric history on file.     Family History  Problem Relation Age of  Onset  . Breast cancer Mother 83  . Hypertension Mother   . Prostate cancer Father   . Hypertension Father   . Dementia Sister   . Lung cancer Brother   . Hypertension Brother   . COPD Brother   . Cancer Maternal Grandmother   . Arthritis Sister   . Hypertension Sister   . Arthritis Sister   . Hypertension Child   . Hypertension Child   . Hypertension Child   . Colon polyps Neg Hx   . Esophageal cancer Neg Hx   . Rectal cancer Neg Hx   . Stomach cancer Neg Hx   . Colon cancer Neg Hx     Social History   Tobacco Use  . Smoking status: Never Smoker  . Smokeless tobacco: Never Used  Vaping Use  . Vaping Use: Never used  Substance Use Topics  . Alcohol use: No  .  Drug use: No    Home Medications Prior to Admission medications   Medication Sig Start Date End Date Taking? Authorizing Provider  allopurinol (ZYLOPRIM) 300 MG tablet TAKE 1 TABLET(300 MG) BY MOUTH DAILY 12/20/20  Yes [name], MD  aspirin EC 81 MG tablet Take 1 tablet (81 mg total) by mouth daily. 06/24/19  Yes [name], MD  atenolol-chlorthalidone (TENORETIC) 50-25 MG tablet TAKE 1 TABLET BY MOUTH EVERY DAY AT 11 AM 12/02/20  Yes [name], MD  benazepril (LOTENSIN) 10 MG tablet Take 10 mg by mouth daily. 12/28/20  Yes [provider]  Cholecalciferol (VITAMIN D3) 1.25 MG (50000 UT) CAPS Take 1 capsule by mouth on Tuesday and Friday 06/07/20  Yes [name], MD  Cyanocobalamin (VITAMIN B12 PO) Take by mouth.   Yes [provider]  Magnesium 250 MG TABS Take 1 tablet (250 mg total) by mouth every evening. Take with dinner meal 05/12/20  Yes [name], FNP  Multiple Vitamins-Minerals (ZINC PO) Take 1 tablet by mouth daily.    Yes [provider]  Probiotic Product (PROBIOTIC PO) Take 1 capsule by mouth daily.    Yes [provider]  rosuvastatin (CRESTOR) 20 MG tablet TAKE 1 TABLET(20 MG) BY MOUTH DAILY 10/01/20  Yes [name], MD  Dulaglutide (TRULICITY)  [id number] SOPN INJECT 0.5 ML(0.75 MG) SUBCUTANEOUSLY EVERY WEEK IN ABDOMEN, THIGH OR UPPER ARM ROTATING INJECTION SITE 07/22/19   [name], MD  hydrOXYzine (ATARAX/VISTARIL) 10 MG tablet Take 1 tablet (10 mg total) by mouth 3 (three) times daily as needed. 12/30/20   [name], MD  Lancets (ONETOUCH DELICA PLUS [id number]) [place] USE TO CHECK BLOOD SUGAR TWICE DAILY 12/24/20   [name], MD  [facility] VERIO test strip USE TO TEST 2 TIMES DAILY 12/20/20   [name], MD    Allergies    Atorvastatin calcium, Bee venom, Oxycodone, and Vicodin [hydrocodone-acetaminophen]  Review of Systems   Review of Systems  Gastrointestinal: Positive for abdominal pain.   All other systems reviewed and are negative except that which was mentioned in HPI  Physical Exam Updated Vital Signs BP (!) 186/81   Pulse 72   Temp 98.3 F (36.8 C) (Oral)   Resp 16   Ht 5' 7.5" (1.715 m)   Wt 107.5 kg   SpO2 100%   BMI 36.57 kg/m   Physical Exam Constitutional:      General: She is not in acute distress.    Appearance: Normal appearance.  HENT:     Head: Normocephalic and atraumatic.  Eyes:     Conjunctiva/sclera: Conjunctivae normal.  Cardiovascular:     Rate and Rhythm: Normal rate and regular rhythm.     Heart sounds: Normal heart sounds. No murmur heard.   Pulmonary:     Effort: Pulmonary effort is normal.     Breath sounds: Normal breath sounds.  Abdominal:     General: Abdomen is flat. Bowel sounds are normal. There is no distension.     Palpations: Abdomen is soft.     Tenderness: There is no abdominal tenderness.  Musculoskeletal:     Right lower leg: No edema.     Left lower leg: No edema.  Skin:    General: Skin is warm and dry.  Neurological:     Mental Status: She is alert and oriented to person, place, and time.     Comments: fluent  Psychiatric:        Mood and Affect: Mood normal.  Behavior: Behavior normal.     ED Results / Procedures / Treatments    Labs (all labs ordered are listed, but only abnormal results are displayed) Labs Reviewed  COMPREHENSIVE METABOLIC PANEL - Abnormal; Notable for the following components:      Result Value   Glucose, Bld 157 (*)    Alkaline Phosphatase 36 (*)    GFR, Estimated 59 (*)    All other components within normal limits  LIPASE, BLOOD  CBC WITH DIFFERENTIAL/PLATELET    EKG None  Radiology CT Abdomen Pelvis W Contrast  Result Date: 02/18/2021 CLINICAL DATA:  Right lower quadrant abdominal pain. Question diverticulitis. Previous bowel resection. EXAM: CT ABDOMEN AND PELVIS WITH CONTRAST TECHNIQUE: Multidetector CT imaging of the abdomen and pelvis was performed using the standard protocol following bolus administration of intravenous contrast. CONTRAST:  160mL OMNIPAQUE IOHEXOL 300 MG/ML  SOLN COMPARISON:  10/30/2019 FINDINGS: Lower chest: Normal Hepatobiliary: Mild fatty change of the liver. Multiple calcified gallstones within the gallbladder. No CT evidence of cholecystitis or obstruction. Pancreas: Normal Spleen: Normal Adrenals/Urinary Tract: Adrenal glands are normal. Few small cortical low-density abnormalities of the kidneys. At the upper pole on the right, there is an enlarging focus measuring 13 mm in size which was not present at all on the study of November 2019. This could represent a small developing cortical mass. MRI with and without contrast is recommended. No bladder abnormality is seen. Stomach/Bowel: Stomach is normal. No small bowel abnormality. The appendix is normal. There is diverticulosis of the left colon but without evidence of active diverticulitis presently. Previous bowel resection in the rectal region with anastomotic sutures. Vascular/Lymphatic: Aortic atherosclerosis. No aneurysm. IVC is normal. No retroperitoneal adenopathy. Reproductive: Previous hysterectomy.  No pelvic mass. Other: Multiple areas of ventral fat herniation, similar to the previous exams. No evidence of  herniated bowel. Musculoskeletal: Lower lumbar degenerative changes. No acute musculoskeletal finding. IMPRESSION: 1. No acute finding to explain right lower quadrant pain. The appendix is normal. Diverticulosis of the left colon but without evidence of active diverticulitis presently. 2. Multiple areas of ventral fat herniation, similar to the previous exams. No evidence of herniated bowel. 3. Mild fatty change of the liver. 4. Cholelithiasis without CT evidence of cholecystitis or obstruction. 5. 13 mm enlarging focus at the upper pole of the right kidney. This could represent a small developing cortical mass. MRI with and without contrast is recommended electively. 6. Aortic atherosclerosis. Aortic Atherosclerosis (ICD10-I70.0). Electronically Signed   By: Nelson Chimes M.D.   On: 02/18/2021 16:34   DG Abd 2 Views  Result Date: 02/18/2021 CLINICAL DATA:  RIGHT lower quadrant abdominal pain and constipation for 3 days, pressure-like sensation to RIGHT hip, pain radiating around from back to RIGHT lower quadrant EXAM: ABDOMEN - 2 VIEW COMPARISON:  CT abdomen and pelvis 10/30/2019 FINDINGS: Nonobstructive bowel gas pattern. Scattered stool in colon, normal stool burden. No bowel dilatation, bowel wall thickening or free air. LEFT paraspinal calcification at L4, 10 mm diameter seen only on 1, corresponding to anterior abdominal wall calcification on CT series 2, image 48. No definite RIGHT-sided urinary tract calcifications seen. Surgical clips in pelvis as well as anastomotic staple line. Degenerative disc disease changes lumbar spine. IMPRESSION: No acute abnormalities. Electronically Signed   By: Lavonia Dana M.D.   On: 02/18/2021 12:46    Procedures Procedures   Medications Ordered in ED Medications  iohexol (OMNIPAQUE) 300 MG/ML solution 100 mL (100 mLs Intravenous Contrast Given 02/18/21 1600)    ED  Course  I have reviewed the triage vital signs and the nursing notes.  Pertinent labs & imaging  results that were available during my care of the patient were reviewed by me and considered in my medical decision making (see chart for details).    MDM Rules/Calculators/A&P                          Pt well appearing on exam, afebrile. UC obtained abd XR which was normal and UA without blood or signs of infection. DDx includes diverticulitis, kidney stone, problem at bowel anastomosis site, hernia. Obtained labs and abd/pelvis CT given her extensive surgical history and persistent pain.   All lab work unremarkable including normal WBC count, normal LFTs and lipase, normal creatinine.  CT with no acute findings to explain the patient's symptoms.  She has diverticulosis without diverticulitis, appendix is normal, and no evidence of incarcerated hernia.  She has incidental cholelithiasis but symptoms are not consistent with cholecystitis and the fact that she has been eating and drinking normally is reassuring.  She does have incidental finding of 13 mm enlarging focus on right kidney with recommendation for MRI.  I have discussed this incidental finding for the patient and instructed her to follow-up with PCP to have this MRI scheduled as an outpatient.  I discussed supportive measures for her pain and extensively reviewed return precautions.  She voiced understanding. Final Clinical Impression(s) / ED Diagnoses Final diagnoses:  Right lower quadrant abdominal pain    Rx / DC Orders ED Discharge Orders    None       Danesha Kirchoff, Wenda Overland, MD 02/18/21 1652

## 2021-02-19 LAB — URINE CULTURE: Culture: 20000 — AB

## 2021-02-20 NOTE — ED Provider Notes (Signed)
[place]    CSN: [id number] Arrival date & time: 02/18/21  3790      History   Chief Complaint Chief Complaint  Patient presents with  . Abdominal Pain    HPI [name] is a 80 y.o. female.   Patient presenting today with several day history of severe right lower quadrant pain that she states feels like is radiating to her right hip and around to her right flank.  She states movement does make the pain worse, rest mildly relieves it.  She denies any fevers, chills, nausea vomiting or diarrhea, urinary symptoms recent falls or other injuries, new foods, recent travels, new medications, recent sick contacts.  She states the pain feels deep in her belly and not muscular.  History of diverticulitis status post partial right colon resection and states this feels very reminiscent of that episode.  She is especially concerned as she ate some nuts in a salad yesterday and thinks this could be causing a diverticulitis flare and also has lost a crown to her back right molar which she also was worried it could be stuck in her bowel.  She is tolerating p.o. at this time and not taking any medication for symptoms.     Past Medical History:  Diagnosis Date  . Abdominal pain   . Arthritis    cervical disc degeneration/ oa left knee, carpal tunnel rt wrist, adhesive capsulitis right shoulder, rt hand weakness; lumbar degeneration  . Carpal tunnel syndrome   . 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 Constipation   . Diabetes mellitus without complication ([place])    borderline - diet control  . Diverticulitis hx of  . Diverticulosis   . E. coli infection    2020  . Frequent UTI    hx of urethral injury during colon surgery - states frequent uti's since  . GERD (gastroesophageal reflux disease)   . H/O  hiatal hernia   . History of palpitations    in the past  . History of shingles    has a lingering itching on back where shingles were  . Hyperlipidemia   . Hypertension   . Numbness and tingling in right hand    pt. states has numbness of right hand very frequently-watch positioning  . Obesity   . Osteoarthritis   . Osteoporosis   . Pain    pain left knee and pain right hip and right groin  . Personal history of colonic polyps-adenoma 08/26/2008  . Pneumonia 2005  . Vitamin D deficiency     Patient Active Problem List   Diagnosis Date Noted  . 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 Aortic atherosclerosis ([place]) 01/03/2021  . Class 2 severe obesity due to excess calories with serious comorbidity and body mass index (BMI) of 39.0 to 39.9 in adult ([place]) 01/03/2021  . Pruritus 01/03/2021  . Vitamin D deficiency 06/01/2020  . CAP (community acquired pneumonia) 05/06/2020  . Abdominal bloating 02/17/2019  . Urinary frequency 02/17/2019  . Uncontrolled type 2 diabetes mellitus with hyperglycemia ([place]) 02/17/2019  . Lightheaded 02/17/2019  . Constipation 06/26/2018  . Hyperlipidemia 08/23/2014  . Type 2 diabetes mellitus with stage 3 chronic kidney disease, without long-term current use of insulin ([place]) 06/19/2014  . Mixed hyperlipidemia 06/19/2014  . S/P urological surgery 09/30/2013  . Joint pain  09/30/2013  . UTI (urinary tract infection) 09/30/2013  . Cough 12/10/2012  . OA (osteoarthritis) of knee 01/05/2012  . 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 Vaginal discharge 08/02/2011  . Spasm of lumbar paraspinous muscle 06/17/2011  . Morbidly obese ([place]) 05/31/2011  . HIP PAIN, LEFT, CHRONIC 11/09/2010  . CARPAL TUNNEL SYNDROME, RIGHT 10/14/2010  . KNEE PAIN, RIGHT, CHRONIC 10/14/2010  . LEFT BUNDLE BRANCH BLOCK 07/26/2010  . 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 FATIGUE 07/01/2010  . CHEST PAIN, ATYPICAL 07/01/2010  . EPIGASTRIC PAIN 07/01/2010  .  SUPRAPUBIC PAIN 07/01/2010  . HYPERGLYCEMIA 07/01/2010  . CELLULITIS AND ABSCESS OF OTHER SPECIFIED SITE 02/15/2010  . LUMP OR MASS IN BREAST 01/10/2010  . DYSURIA, CHRONIC 03/09/2009  . GERD 10/06/2008  . Personal history of colonic polyps-adenoma 08/26/2008  . LOC OSTEOARTHROS NOT SPEC PRIM/SEC LOWER LEG 09/10/2007  . GOUT, UNSPECIFIED 09/09/2007  . DIVERTICULOSIS, COLON 09/09/2007  . DIVERTICULITIS, HX OF 09/09/2007  . HYPERLIPIDEMIA 06/03/2007  . Essential hypertension 06/03/2007    Past Surgical History:  Procedure Laterality Date  . ABDOMINAL HYSTERECTOMY    . bladder tack    . BREAST BIOPSY Left   . BREAST SURGERY     breast duct resection- benign  . CARPAL TUNNEL RELEASE Right 06/04/2014   Procedure: RIGHT CARPAL TUNNEL RELEASE;  Surgeon: Roseanne Kaufman, MD;  Location: [facility];  Service: Orthopedics;  Laterality: Right;  . COLON RESECTION  2008   hx diverticulosis  . COLON SURGERY    . COLONOSCOPY    . colonoscopy [id number]    . JOINT REPLACEMENT    . OOPHORECTOMY    . POLYPECTOMY    . skin graft left arm - traumatic compression injury left upper arm    . temporary ureter stent    . TOTAL KNEE ARTHROPLASTY  01/05/2012   Procedure: TOTAL KNEE ARTHROPLASTY;  Surgeon: [name], MD;  Location: [facility];  Service: Orthopedics;  Laterality: Right;  . TOTAL KNEE ARTHROPLASTY Left 08/17/2014   Procedure: LEFT TOTAL KNEE ARTHROPLASTY;  Surgeon: [name], MD;  Location: [facility];  Service: Orthopedics;  Laterality: Left;  . ureter repair for tyransected left ureter      OB History   No obstetric history on file.      Home Medications    Prior to Admission medications   Medication Sig Start Date End Date Taking? Authorizing Provider  allopurinol (ZYLOPRIM) 300 MG tablet TAKE 1 TABLET(300 MG) BY MOUTH DAILY 12/20/20   [name], MD  aspirin EC 81 MG tablet Take 1 tablet (81 mg total) by mouth daily. 06/24/19   [name], MD   atenolol-chlorthalidone (TENORETIC) 50-25 MG tablet TAKE 1 TABLET BY MOUTH EVERY DAY AT 11 AM 12/02/20   [name], MD  benazepril (LOTENSIN) 10 MG tablet Take 10 mg by mouth daily. 12/28/20   [provider]  Cholecalciferol (VITAMIN D3) 1.25 MG (50000 UT) CAPS Take 1 capsule by mouth on Tuesday and Friday 06/07/20   [name], MD  Cyanocobalamin (VITAMIN B12 PO) Take by mouth.    [provider]  Dulaglutide (TRULICITY) [id number] [id number] SOPN INJECT 0.5 ML(0.75 MG) SUBCUTANEOUSLY EVERY WEEK IN ABDOMEN, THIGH OR UPPER ARM ROTATING INJECTION SITE 07/22/19   [name], MD  hydrOXYzine (ATARAX/VISTARIL) 10 MG tablet Take 1 tablet (10 mg total) by mouth 3 (three) times daily as needed. 12/30/20   [name], MD  Lancets (ONETOUCH DELICA PLUS [id number]) Clifford USE TO CHECK  BLOOD SUGAR TWICE DAILY 12/24/20   [name], MD  Magnesium 250 MG TABS Take 1 tablet (250 mg total) by mouth every evening. Take with dinner meal 05/12/20   [name], FNP  Multiple Vitamins-Minerals (ZINC PO) Take 1 tablet by mouth daily.     [provider]  [facility] VERIO test strip USE TO TEST 2 TIMES DAILY 12/20/20   [name], MD  Probiotic Product (PROBIOTIC PO) Take 1 capsule by mouth daily.     [provider]  rosuvastatin (CRESTOR) 20 MG tablet TAKE 1 TABLET(20 MG) BY MOUTH DAILY 10/01/20   [name], MD    Family History Family History  Problem Relation Age of Onset  . Breast cancer Mother 4  . Hypertension Mother   . Prostate cancer Father   . Hypertension Father   . Dementia Sister   . Lung cancer Brother   . Hypertension Brother   . COPD Brother   . Cancer Maternal Grandmother   . Arthritis Sister   . Hypertension Sister   . Arthritis Sister   . Hypertension Child   . Hypertension Child   . Hypertension Child   . Colon polyps Neg Hx   . Esophageal cancer Neg Hx   . Rectal cancer Neg Hx   . Stomach cancer Neg Hx   . Colon cancer Neg Hx      Social History Social History   Tobacco Use  . Smoking status: Never Smoker  . Smokeless tobacco: Never Used  Vaping Use  . Vaping Use: Never used  Substance Use Topics  . Alcohol use: No  . Drug use: No     Allergies   Atorvastatin calcium, Bee venom, Oxycodone, and Vicodin [hydrocodone-acetaminophen]   Review of Systems Review of Systems Per HPI  Physical Exam Triage Vital Signs ED Triage Vitals  Enc Vitals Group     BP 02/18/21 1046 (!) 178/85     Pulse Rate 02/18/21 1046 (!) 59     Resp 02/18/21 1046 18     Temp 02/18/21 1046 97.9 F (36.6 C)     Temp Source 02/18/21 1046 Oral     SpO2 02/18/21 1046 97 %     Weight --      Height --      Head Circumference --      Peak Flow --      Pain Score 02/18/21 1047 9     Pain Loc --      Pain Edu? --      Excl. in [place]? --    No data found.  Updated Vital Signs BP (!) 178/85 (BP Location: Left Arm)   Pulse (!) 59   Temp 97.9 F (36.6 C) (Oral)   Resp 18   SpO2 97%   Visual Acuity Right Eye Distance:   Left Eye Distance:   Bilateral Distance:    Right Eye Near:   Left Eye Near:    Bilateral Near:     Physical Exam Vitals and nursing note reviewed.  Constitutional:      Appearance: Normal appearance. She is not ill-appearing.  HENT:     Head: Atraumatic.     Mouth/Throat:     Mouth: Mucous membranes are moist.     Pharynx: Oropharynx is clear.  Eyes:     Extraocular Movements: Extraocular movements intact.     Conjunctiva/sclera: Conjunctivae normal.  Cardiovascular:     Rate and Rhythm: Normal rate and regular rhythm.     Heart  sounds: Normal heart sounds.  Pulmonary:     Effort: Pulmonary effort is normal.     Breath sounds: Normal breath sounds.  Abdominal:     General: Bowel sounds are normal. There is no distension.     Palpations: Abdomen is soft.     Tenderness: There is abdominal tenderness. There is no right CVA tenderness, left CVA tenderness or guarding.  Musculoskeletal:         General: Normal range of motion.     Cervical back: Normal range of motion and neck supple.     Comments: Mildly antalgic gait  Skin:    General: Skin is warm and dry.  Neurological:     Mental Status: She is alert and oriented to person, place, and time.  Psychiatric:        Mood and Affect: Mood normal.        Thought Content: Thought content normal.        Judgment: Judgment normal.      UC Treatments / Results  Labs (all labs ordered are listed, but only abnormal results are displayed) Labs Reviewed  URINE CULTURE - Abnormal; Notable for the following components:      Result Value   Culture   (*)    Value: 20,000 COLONIES/mL LACTOBACILLUS SPECIES Standardized susceptibility testing for this organism is not available. Performed at [facility], [place] [address] [place], [place] [address]    All other components within normal limits  POCT URINALYSIS DIP (MANUAL ENTRY) - Abnormal; Notable for the following components:   Leukocytes, UA Trace (*)    All other components within normal limits  CBC WITH DIFFERENTIAL/PLATELET  COMPREHENSIVE METABOLIC PANEL    EKG   Radiology CT Abdomen Pelvis W Contrast  Result Date: 02/18/2021 CLINICAL DATA:  Right lower quadrant abdominal pain. Question diverticulitis. Previous bowel resection. EXAM: CT ABDOMEN AND PELVIS WITH CONTRAST TECHNIQUE: Multidetector CT imaging of the abdomen and pelvis was performed using the standard protocol following bolus administration of intravenous contrast. CONTRAST:  13mL OMNIPAQUE IOHEXOL 300 MG/ML  SOLN COMPARISON:  10/30/2019 FINDINGS: Lower chest: Normal Hepatobiliary: Mild fatty change of the liver. Multiple calcified gallstones within the gallbladder. No CT evidence of cholecystitis or obstruction. Pancreas: Normal Spleen: Normal Adrenals/Urinary Tract: Adrenal glands are normal. Few small cortical low-density abnormalities of the kidneys. At the upper pole on the right, there is an enlarging  focus measuring 13 mm in size which was not present at all on the study of November 2019. This could represent a small developing cortical mass. MRI with and without contrast is recommended. No bladder abnormality is seen. Stomach/Bowel: Stomach is normal. No small bowel abnormality. The appendix is normal. There is diverticulosis of the left colon but without evidence of active diverticulitis presently. Previous bowel resection in the rectal region with anastomotic sutures. Vascular/Lymphatic: Aortic atherosclerosis. No aneurysm. IVC is normal. No retroperitoneal adenopathy. Reproductive: Previous hysterectomy.  No pelvic mass. Other: Multiple areas of ventral fat herniation, similar to the previous exams. No evidence of herniated bowel. Musculoskeletal: Lower lumbar degenerative changes. No acute musculoskeletal finding. IMPRESSION: 1. No acute finding to explain right lower quadrant pain. The appendix is normal. Diverticulosis of the left colon but without evidence of active diverticulitis presently. 2. Multiple areas of ventral fat herniation, similar to the previous exams. No evidence of herniated bowel. 3. Mild fatty change of the liver. 4. Cholelithiasis without CT evidence of cholecystitis or obstruction. 5. 13 mm enlarging focus at the upper pole  of the right kidney. This could represent a small developing cortical mass. MRI with and without contrast is recommended electively. 6. Aortic atherosclerosis. Aortic Atherosclerosis (ICD10-I70.0). Electronically Signed   By: Nelson Chimes M.D.   On: 02/18/2021 16:34   DG Abd 2 Views  Result Date: 02/18/2021 CLINICAL DATA:  RIGHT lower quadrant abdominal pain and constipation for 3 days, pressure-like sensation to RIGHT hip, pain radiating around from back to RIGHT lower quadrant EXAM: ABDOMEN - 2 VIEW COMPARISON:  CT abdomen and pelvis 10/30/2019 FINDINGS: Nonobstructive bowel gas pattern. Scattered stool in colon, normal stool burden. No bowel dilatation,  bowel wall thickening or free air. LEFT paraspinal calcification at L4, 10 mm diameter seen only on 1, corresponding to anterior abdominal wall calcification on CT series 2, image 48. No definite RIGHT-sided urinary tract calcifications seen. Surgical clips in pelvis as well as anastomotic staple line. Degenerative disc disease changes lumbar spine. IMPRESSION: No acute abnormalities. Electronically Signed   By: Lavonia Dana M.D.   On: 02/18/2021 12:46    Procedures Procedures (including critical care time)  Medications Ordered in UC Medications - No data to display  Initial Impression / Assessment and Plan / UC Course  I have reviewed the triage vital signs and the nursing notes.  Pertinent labs & imaging results that were available during my care of the patient were reviewed by me and considered in my medical decision making (see chart for details).     Hypertensive and very mildly bradycardic in triage today, but other vital signs stable and reassuring.  Her exam is overall very reassuring with only findings of right lower quadrant tenderness palpation without guarding.  Abdomen is nondistended, no rebound tenderness, and she has been having normal bowel movements.  She insists on an abdominal x-ray despite discussion around minimal utility and radiation risks, this abdominal x-ray came back without any acute findings.  Discussed with patient that given the severity of her pain and her past history of bowel resection surgery that she would benefit from other abdominal imaging modalities and stat labs.  She is initially resistant to going to the ED for further evaluation and requests [place] to do what we can here.  We were unfortunately unable to obtain a blood specimen today after multiple attempts.  Urine fairly unrevealing, will send out for culture given trace leukocytes and sample though this is likely incidental.  In the end, she wishes to go to the ED for further evaluation as we are unable to  identify an exact cause of her pain today.  She wishes to go via private vehicle which her husband is agreeable to driving her.  She is hemodynamically stable for transport.  Final Clinical Impressions(s) / UC Diagnoses   Final diagnoses:  RLQ abdominal pain  Right hip pain  History of partial colectomy     Discharge Instructions     [facility] [address] [place], [place] [address]    ED Prescriptions    None     PDMP not reviewed this encounter.   Merrie Roof [place], [place] 02/20/21 [phone]

## 2021-02-21 ENCOUNTER — Other Ambulatory Visit: Payer: Self-pay | Admitting: Internal Medicine

## 2021-02-21 ENCOUNTER — Encounter: Payer: Self-pay | Admitting: Nurse Practitioner

## 2021-02-22 ENCOUNTER — Telehealth: Payer: Medicare Other

## 2021-02-22 ENCOUNTER — Telehealth: Payer: Self-pay

## 2021-02-22 ENCOUNTER — Other Ambulatory Visit: Payer: Self-pay | Admitting: Internal Medicine

## 2021-02-22 NOTE — Telephone Encounter (Signed)
  Chronic Care Management   Outreach Note  02/22/2021 Name: [name] MRN: [id number] DOB: 1940-12-23  Referred by: [name], MD Reason for referral : Chronic Care Management (RN CM FU Call )   An unsuccessful telephone outreach was attempted today. The patient was referred to the case management team for assistance with care management and care coordination.   Follow Up Plan: Telephone follow up appointment with care management team member scheduled for: 04/13/21  Barb Merino, RN, BSN, CCM Care Management Coordinator [place] [facility]  Direct Phone: [phone]  g

## 2021-02-23 ENCOUNTER — Ambulatory Visit (INDEPENDENT_AMBULATORY_CARE_PROVIDER_SITE_OTHER): Payer: Medicare Other | Admitting: Internal Medicine

## 2021-02-23 ENCOUNTER — Other Ambulatory Visit: Payer: Self-pay

## 2021-02-23 ENCOUNTER — Ambulatory Visit: Payer: Medicare Other

## 2021-02-23 ENCOUNTER — Encounter: Payer: Self-pay | Admitting: Internal Medicine

## 2021-02-23 VITALS — BP 134/76 | HR 55 | Temp 97.6°F | Ht 67.5 in | Wt 237.2 lb

## 2021-02-23 DIAGNOSIS — Q639 Congenital malformation of kidney, unspecified: Secondary | ICD-10-CM | POA: Diagnosis not present

## 2021-02-23 DIAGNOSIS — Z6836 Body mass index (BMI) 36.0-36.9, adult: Secondary | ICD-10-CM

## 2021-02-23 DIAGNOSIS — N2889 Other specified disorders of kidney and ureter: Secondary | ICD-10-CM

## 2021-02-23 DIAGNOSIS — G8929 Other chronic pain: Secondary | ICD-10-CM | POA: Diagnosis not present

## 2021-02-23 DIAGNOSIS — R1031 Right lower quadrant pain: Secondary | ICD-10-CM

## 2021-02-23 NOTE — Progress Notes (Signed)
I,Katawbba Wiggins,acting as a Education administrator for Maximino Greenland, MD.,have documented all relevant documentation on the behalf of Maximino Greenland, MD,as directed by  Maximino Greenland, MD while in the presence of Maximino Greenland, MD.  This visit occurred during the SARS-CoV-2 public health emergency.  Safety protocols were in place, including screening questions prior to the visit, additional usage of staff PPE, and extensive cleaning of exam room while observing appropriate contact time as indicated for disinfecting solutions.  Subjective:     Patient ID: [name] , female    DOB: 1941/02/18 , 80 y.o.   MRN: [id number]   Chief Complaint  Patient presents with  . ER follow-up    HPI  The patient is here today for follow-up on an ER visit.  She presented to ER on 4/22 with 4 day history of abdominal pain. She went ot UC on same day and had normal AXR and nl urinalysis; however, was sent to ER for further evaluaiton.  Of note, she does have past h/o diverticulitis and previous bowel surgery.  ER workup revealed: All lab work unremarkable including normal WBC count, normal LFTs and lipase, normal creatinine.  CT with no acute findings to explain the patient's symptoms.  She has diverticulosis without diverticulitis, appendix is normal, and no evidence of incarcerated hernia.  CT also revealed incidental cholelithiasis and 13 mm enlarging focus on right kidney with recommendation for MRI.  Her abdominal discomfort has resolved since d/c and she has not had any recurrence of symptoms. She is concerned about the renal finding.    Past Medical History:  Diagnosis Date  . Abdominal pain   . Arthritis    cervical disc degeneration/ oa left knee, carpal tunnel rt wrist, adhesive capsulitis right shoulder, rt hand weakness; lumbar degeneration  . Carpal tunnel syndrome   . Complication of anesthesia 2006-at [facility]   breathing problems-no BP med given prior to surgery;  hx of being very sleepy after  colon surgery --  states no problems with last right total knee replacement 2013  . Constipation   . Diabetes mellitus without complication ([place])    borderline - diet control  . Diverticulitis hx of  . Diverticulosis   . E. coli infection    2020  . Frequent UTI    hx of urethral injury during colon surgery - states frequent uti's since  . GERD (gastroesophageal reflux disease)   . H/O hiatal hernia   . History of palpitations    in the past  . History of shingles    has a lingering itching on back where shingles were  . Hyperlipidemia   . Hypertension   . Numbness and tingling in right hand    pt. states has numbness of right hand very frequently-watch positioning  . Obesity   . Osteoarthritis   . Osteoporosis   . Pain    pain left knee and pain right hip and right groin  . Personal history of colonic polyps-adenoma 08/26/2008  . Pneumonia 2005  . Vitamin D deficiency      Family History  Problem Relation Age of Onset  . Breast cancer Mother 30  . Hypertension Mother   . Prostate cancer Father   . Hypertension Father   . Dementia Sister   . Lung cancer Brother   . Hypertension Brother   . COPD Brother   . Cancer Maternal Grandmother   . Arthritis Sister   . Hypertension Sister   . Arthritis Sister   .  Hypertension Child   . Hypertension Child   . Hypertension Child   . Colon polyps Neg Hx   . Esophageal cancer Neg Hx   . Rectal cancer Neg Hx   . Stomach cancer Neg Hx   . Colon cancer Neg Hx      Current Outpatient Medications:  .  allopurinol (ZYLOPRIM) 300 MG tablet, TAKE 1 TABLET(300 MG) BY MOUTH DAILY, Disp: 90 tablet, Rfl: 0 .  aspirin EC 81 MG tablet, Take 1 tablet (81 mg total) by mouth daily., Disp: 90 tablet, Rfl: 0 .  atenolol-chlorthalidone (TENORETIC) 50-25 MG tablet, TAKE 1 TABLET BY MOUTH EVERY DAY AT 11 AM, Disp: 90 tablet, Rfl: 0 .  benazepril (LOTENSIN) 10 MG tablet, Take 10 mg by mouth daily., Disp: , Rfl:  .  Cholecalciferol (VITAMIN D3)  1.25 MG (50000 UT) CAPS, Take 1 capsule by mouth on Tuesday and Friday, Disp: 24 capsule, Rfl: 1 .  Dulaglutide (TRULICITY) 8.34 HD/6.2IW SOPN, INJECT 0.5 ML(0.75 MG) SUBCUTANEOUSLY EVERY WEEK IN ABDOMEN, THIGH OR UPPER ARM ROTATING INJECTION SITE, Disp: 2 mL, Rfl: 0 .  Lancets (ONETOUCH DELICA PLUS [id number]) MISC, USE TO CHECK BLOOD SUGAR TWICE DAILY, Disp: 100 each, Rfl: 3 .  ONETOUCH VERIO test strip, USE TO TEST 2 TIMES DAILY, Disp: 100 strip, Rfl: 3 .  Probiotic Product (PROBIOTIC PO), Take 1 capsule by mouth daily. , Disp: , Rfl:  .  rosuvastatin (CRESTOR) 20 MG tablet, TAKE 1 TABLET(20 MG) BY MOUTH DAILY, Disp: 90 tablet, Rfl: 0 .  Vitamin D, Ergocalciferol, (DRISDOL) 1.25 MG (50000 UNIT) CAPS capsule, TAKE 1 CAPSULE BY MOUTH TWICE WEEKLY ON TUESDAYS AND FRIDAYS, Disp: 26 capsule, Rfl: 0 .  Cyanocobalamin (VITAMIN B12 PO), Take by mouth., Disp: , Rfl:  .  hydrOXYzine (ATARAX/VISTARIL) 10 MG tablet, Take 1 tablet (10 mg total) by mouth 3 (three) times daily as needed., Disp: 30 tablet, Rfl: 0 .  levocetirizine (XYZAL) 5 MG tablet, Take 1 tablet (5 mg total) by mouth every evening., Disp: 30 tablet, Rfl: 5 .  Magnesium 250 MG TABS, Take 1 tablet (250 mg total) by mouth every evening. Take with dinner meal, Disp: 90 tablet, Rfl: 1 .  Multiple Vitamins-Minerals (ZINC PO), Take 1 tablet by mouth daily., Disp: , Rfl:    Allergies  Allergen Reactions  . Atorvastatin Calcium Itching  . Bee Venom Hives  . Oxycodone Other (See Comments)    hallucinations  . Vicodin [Hydrocodone-Acetaminophen] Other (See Comments)    hallucinations     Review of Systems  Constitutional: Negative.   Respiratory: Negative.   Cardiovascular: Negative.   Gastrointestinal: Negative.   Psychiatric/Behavioral: Negative.   All other systems reviewed and are negative.    Today's Vitals   02/23/21 1211  BP: 134/76  Pulse: (!) 55  Temp: 97.6 F (36.4 C)  TempSrc: Oral  Weight: 237 lb 3.2 oz (107.6 kg)   Height: 5' 7.5" (1.715 m)  PainSc: 0-No pain   Body mass index is 36.6 kg/m.  Wt Readings from Last 3 Encounters:  02/24/21 236 lb 6.4 oz (107.2 kg)  02/23/21 237 lb 3.2 oz (107.6 kg)  02/18/21 237 lb (107.5 kg)   Objective:  Physical Exam Vitals and nursing note reviewed.  Constitutional:      Appearance: Normal appearance. She is obese.  HENT:     Head: Normocephalic and atraumatic.     Nose:     Comments: Masked     Mouth/Throat:     Comments: Masked  Cardiovascular:     Rate and Rhythm: Normal rate and regular rhythm.     Heart sounds: Normal heart sounds.  Pulmonary:     Effort: Pulmonary effort is normal.     Breath sounds: Normal breath sounds.  Abdominal:     General: Bowel sounds are normal. There is no distension.     Palpations: Abdomen is soft.     Comments: Obese, soft. No tenderness on exam today  Skin:    General: Skin is warm.  Neurological:     General: No focal deficit present.     Mental Status: She is alert.  Psychiatric:        Mood and Affect: Mood normal.        Behavior: Behavior normal.         Assessment And Plan:     1. Chronic RLQ pain Comments: ER records reviewed in detail, as well as CT results. Pt advised her intermittent sx could be due to gas. Sx may lessen if she walks 10 minutes after eating.   2. Renal structural abnormality Comments: CT scan results reviewed. Will order MRI abdomen to further evaluate 44mm lesion on right kidney.  - MR Abdomen W Wo Contrast; Future  3. Class 2 severe obesity due to excess calories with serious comorbidity and body mass index (BMI) of 36.0 to 36.9 in adult [facility]) Comments: The importance of regular exercise was again stressed to the patient.  She is encouraged to work up to 30 minutes five days per week.    Patient was given opportunity to ask questions. Patient verbalized understanding of the plan and was able to repeat key elements of the plan. All questions were answered to their  satisfaction.   I, Maximino Greenland, MD, have reviewed all documentation for this visit. The documentation on 03/13/21 for the exam, diagnosis, procedures, and orders are all accurate and complete.   IF YOU HAVE BEEN REFERRED TO A SPECIALIST, IT MAY TAKE 1-2 WEEKS TO SCHEDULE/PROCESS THE REFERRAL. IF YOU HAVE NOT HEARD FROM US/SPECIALIST IN TWO WEEKS, PLEASE GIVE [place] A CALL AT [phone].   THE PATIENT IS ENCOURAGED TO PRACTICE SOCIAL DISTANCING DUE TO THE COVID-19 PANDEMIC.

## 2021-02-24 ENCOUNTER — Encounter: Payer: Self-pay | Admitting: Allergy

## 2021-02-24 ENCOUNTER — Ambulatory Visit: Payer: Medicare Other | Admitting: Allergy

## 2021-02-24 VITALS — BP 160/70 | HR 62 | Temp 97.8°F | Resp 14 | Ht 66.5 in | Wt 236.4 lb

## 2021-02-24 DIAGNOSIS — T7840XD Allergy, unspecified, subsequent encounter: Secondary | ICD-10-CM | POA: Diagnosis not present

## 2021-02-24 DIAGNOSIS — L299 Pruritus, unspecified: Secondary | ICD-10-CM | POA: Diagnosis not present

## 2021-02-24 MED ORDER — LEVOCETIRIZINE DIHYDROCHLORIDE 5 MG PO TABS: 5.0000 mg | ORAL_TABLET | Freq: Every evening | ORAL | 5 refills | Status: DC

## 2021-02-24 MED ORDER — EPINEPHRINE 0.3 MG/0.3ML IJ SOAJ: 0.3000 mg | Freq: Once | INTRAMUSCULAR | 2 refills | Status: AC

## 2021-02-24 NOTE — Patient Instructions (Addendum)
-  We will obtain IgE levels for seasonings including chili pepper, garlic, paprika, oregano, basil, parsley, turmeric.  These are all ingredients found in SlapYoMama and [place] seasonings.  We will also obtain a tryptase level to see if her allergy cells are "hyperreactive" -If itching recurs, recommend taking a long-acting antihistamine like Zyrtec or Xyzal during the day. -If itching is interfering with sleep then you can take the hydroxyzine as this does cause drowsiness while helping with itch control -Continue your current avoidance of seasonings -Recommend you have access to an epinephrine device in case of an allergic reaction -Follow emergency action plan in case of allergic reaction -Continue to do your best to avoid stinging insects  Follow-up in 6 months or sooner if needed  **We are ordering labs, so please allow 1-2 weeks for the results to come back.  With the newly implemented Cures Act, the labs might be visible to you at the same time that they become visible to me.  However, I will not address the results until all of the results come  back, so please be patient.  In the meantime, continue avoiding your triggering food(s) in your After Visit Summary, including avoidance measures (if applicable), until you hear from me about the results.

## 2021-02-24 NOTE — Progress Notes (Signed)
New Patient Note  RE: [name] MRN: [id number] DOB: 1940/11/14 Date of Office Visit: 02/24/2021  Primary care provider: [name], MD  Chief Complaint: Itching after eating foods of seasonings  History of present illness: [name] is a 80 y.o. female presenting today for evaluation of pruritus after eating.  She presents today with her husband.  She has been having itching and believes it is related to seasonings she was using on her food.  The itching is mostly around the area where she has had shingles 10 years ago- around her bra line extending to around her back.  The itching usually occurs after eating.  She states she stopped using certain seasonings including SlapYoMama, Creole seasoning, Tumeric, parsley and she has had big reduction in the itch.  She states she always seems to have a slight degree of itching in that area ever since she had shingles.  But it is more intense after eating.  She does continue to use Adobe seasoning, chives, black pepper and states has not had any issues with these.  She does moisturize after bathing and moisturization does help with itch. She states what really gets rid of the itch is using rubbing alcohol.  She was prescribed atarax but never took it as the itch improved when she took the seasonings out of her diet.    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  She has never had an epinephrine device.  No history of eczema or asthma.  She denies any symptoms consistent with allergic rhinitis or conjunctivitis.  Review of systems: Review of Systems  Constitutional: Negative.   HENT: Negative.   Eyes: Negative.   Respiratory: Negative.   Cardiovascular: Negative.   Gastrointestinal: Negative.   Musculoskeletal: Negative.   Skin: Positive for itching. Negative for rash.  Neurological: Negative.     All other systems negative unless noted above in HPI  Past  medical history: Past Medical History:  Diagnosis Date  . Abdominal pain   . Arthritis    cervical disc degeneration/ oa left knee, carpal tunnel rt wrist, adhesive capsulitis right shoulder, rt hand weakness; lumbar degeneration  . Carpal tunnel syndrome   . 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 Constipation   . Diabetes mellitus without complication ([place])    borderline - diet control  . Diverticulitis hx of  . Diverticulosis   . E. coli infection    2020  . Frequent UTI    hx of urethral injury during colon surgery - states frequent uti's since  . GERD (gastroesophageal reflux disease)   . H/O hiatal hernia   . History of palpitations    in the past  . History of shingles    has a lingering itching on back where shingles were  . Hyperlipidemia   . Hypertension   . Numbness and tingling in right hand    pt. states has numbness of right hand very frequently-watch positioning  . Obesity   . Osteoarthritis   . Osteoporosis   . Pain    pain left knee and pain right hip and right groin  . Personal history of colonic polyps-adenoma 08/26/2008  . Pneumonia 2005  . Vitamin D deficiency     Past surgical  history: Past Surgical History:  Procedure Laterality Date  . ABDOMINAL HYSTERECTOMY    . bladder tack    . BREAST BIOPSY Left   . BREAST SURGERY     breast duct resection- benign  . CARPAL TUNNEL RELEASE Right 06/04/2014   Procedure: RIGHT CARPAL TUNNEL RELEASE;  Surgeon: Roseanne Kaufman, MD;  Location: [place];  Service: Orthopedics;  Laterality: Right;  . COLON RESECTION  2008   hx diverticulosis  . COLON SURGERY    . COLONOSCOPY    . colonoscopy [id number]    . JOINT REPLACEMENT    . OOPHORECTOMY    . POLYPECTOMY    . skin graft left arm - traumatic compression injury left  upper arm    . temporary ureter stent    . TOTAL KNEE ARTHROPLASTY  01/05/2012   Procedure: TOTAL KNEE ARTHROPLASTY;  Surgeon: [name], MD;  Location: [facility];  Service: Orthopedics;  Laterality: Right;  . TOTAL KNEE ARTHROPLASTY Left 08/17/2014   Procedure: LEFT TOTAL KNEE ARTHROPLASTY;  Surgeon: [name], MD;  Location: [facility];  Service: Orthopedics;  Laterality: Left;  . ureter repair for tyransected left ureter      Family history:  Family History  Problem Relation Age of Onset  . Breast cancer Mother 65  . Hypertension Mother   . Prostate cancer Father   . Hypertension Father   . Dementia Sister   . Lung cancer Brother   . Hypertension Brother   . COPD Brother   . Cancer Maternal Grandmother   . Arthritis Sister   . Hypertension Sister   . Arthritis Sister   . Hypertension Child   . Hypertension Child   . Hypertension Child   . Colon polyps Neg Hx   . Esophageal cancer Neg Hx   . Rectal cancer Neg Hx   . Stomach cancer Neg Hx   . Colon cancer Neg Hx     Social history: Lives in a home with carpeting with electric heating and central cooling.  No pets in the home.  There is no concern for water damage, mildew or roaches in the home.  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  She denies a smoking history.  Medication List: Current Outpatient Medications  Medication Sig Dispense Refill  . allopurinol (ZYLOPRIM) 300 MG tablet TAKE 1 TABLET(300 MG) BY MOUTH DAILY 90 tablet 0  . aspirin EC 81 MG tablet Take 1 tablet (81 mg total) by mouth daily. 90 tablet 0  . atenolol-chlorthalidone (TENORETIC) 50-25 MG tablet TAKE 1 TABLET BY MOUTH EVERY DAY AT 11 AM 90 tablet 0  . benazepril (LOTENSIN) 10 MG tablet Take 10 mg by mouth daily.    . Cholecalciferol (VITAMIN D3) 1.25 MG (50000 UT) CAPS Take 1 capsule by mouth on Tuesday and Friday 24 capsule 1  . Cyanocobalamin (VITAMIN B12 PO) Take by mouth.    . Dulaglutide (TRULICITY) 6.29 BM/8.4XL SOPN INJECT 0.5 ML(0.75 MG)  SUBCUTANEOUSLY EVERY WEEK IN ABDOMEN, THIGH OR UPPER ARM ROTATING INJECTION SITE 2 mL 0  . Lancets (ONETOUCH DELICA PLUS [id number]) MISC USE TO CHECK BLOOD SUGAR TWICE DAILY 100 each 3  . Multiple Vitamins-Minerals (ZINC PO) Take 1 tablet by mouth daily.    [name] test strip USE TO TEST 2 TIMES DAILY 100 strip 3  . Probiotic Product (PROBIOTIC PO) Take 1 capsule by mouth daily.     . rosuvastatin (CRESTOR) 20 MG tablet TAKE 1 TABLET(20 MG) BY MOUTH  DAILY 90 tablet 0  . Vitamin D, Ergocalciferol, (DRISDOL) 1.25 MG (50000 UNIT) CAPS capsule TAKE 1 CAPSULE BY MOUTH TWICE WEEKLY ON TUESDAYS AND FRIDAYS 26 capsule 0  . hydrOXYzine (ATARAX/VISTARIL) 10 MG tablet Take 1 tablet (10 mg total) by mouth 3 (three) times daily as needed. (Patient not taking: No sig reported) 30 tablet 0  . Magnesium 250 MG TABS Take 1 tablet (250 mg total) by mouth every evening. Take with dinner meal (Patient not taking: No sig reported) 90 tablet 1   No current facility-administered medications for this visit.    Known medication allergies: Allergies  Allergen Reactions  . Atorvastatin Calcium Itching  . Bee Venom Hives  . Oxycodone Other (See Comments)    hallucinations  . Vicodin [Hydrocodone-Acetaminophen] Other (See Comments)    hallucinations     Physical examination: Blood pressure (!) 160/70, pulse 62, temperature 97.8 F (36.6 C), resp. rate 14, height 5' 6.5" (1.689 m), weight 236 lb 6.4 oz (107.2 kg), SpO2 97 %.  General: Alert, interactive, in no acute distress. HEENT: PERRLA, TMs pearly gray, turbinates non-edematous without discharge, post-pharynx non erythematous. Neck: Supple without lymphadenopathy. Lungs: Clear to auscultation without wheezing, rhonchi or rales. {no increased work of breathing. CV: Normal S1, S2 without murmurs. Abdomen: Nondistended, nontender. Skin: Warm and dry, without lesions or rashes. Extremities:  No clubbing, cyanosis or edema. Neuro:   Grossly  intact.  Diagnositics/Labs: None today  Assessment and plan: Pruritus Allergic reaction  -We will obtain IgE levels for seasonings including chili pepper, garlic, paprika, oregano, basil, parsley, turmeric.  These are all ingredients found in SlapYoMama and [place] seasonings.  We will also obtain a tryptase level to see if her allergy cells are "hyperreactive" -If itching recurs, recommend taking a long-acting antihistamine like Zyrtec or Xyzal during the day. -If itching is interfering with sleep then you can take the hydroxyzine as this does cause drowsiness while helping with itch control -Continue your current avoidance of seasonings -Recommend you have access to an epinephrine device in case of an allergic reaction -Follow emergency action plan in case of allergic reaction -Continue to do your best to avoid stinging insects  Follow-up in 6 months or sooner if needed  I appreciate the opportunity to take part in Sherilee's care. Please do not hesitate to contact me with questions.  Sincerely,   Prudy Feeler, MD Allergy/Immunology [facility] [place] [facility]  [place]

## 2021-02-28 ENCOUNTER — Other Ambulatory Visit: Payer: Self-pay | Admitting: *Deleted

## 2021-02-28 ENCOUNTER — Telehealth: Payer: Self-pay | Admitting: Allergy

## 2021-02-28 MED ORDER — EPINEPHRINE 0.3 MG/0.3ML IJ SOAJ: 0.3000 mg | Freq: Once | INTRAMUSCULAR | 1 refills | Status: AC

## 2021-02-28 MED ORDER — LEVOCETIRIZINE DIHYDROCHLORIDE 5 MG PO TABS: 5.0000 mg | ORAL_TABLET | Freq: Every evening | ORAL | 5 refills | Status: DC

## 2021-02-28 NOTE — Telephone Encounter (Signed)
Since the patient has Medicare she would benefit from having the generic EpiPen sent to her local pharmacy. Her Levocetirizine was sent to [facility] pharmacy as well. I called and spoke with the patient and she stated that she does not need these medications. I advised to be on the safe side I will send them in and ask the pharmacy to keep them on file for if she might need them. Patient verbalized understanding. Medications have been sent to [facility] on [address] and asked to be kept on file.

## 2021-02-28 NOTE — Telephone Encounter (Signed)
[facility] pharmacy called to leave message that pt would need PA for Auvi-Q.   Pharmacy # : [phone]  Please advise.

## 2021-03-01 DIAGNOSIS — E1122 Type 2 diabetes mellitus with diabetic chronic kidney disease: Secondary | ICD-10-CM | POA: Diagnosis not present

## 2021-03-01 DIAGNOSIS — I129 Hypertensive chronic kidney disease with stage 1 through stage 4 chronic kidney disease, or unspecified chronic kidney disease: Secondary | ICD-10-CM | POA: Diagnosis not present

## 2021-03-01 DIAGNOSIS — N2889 Other specified disorders of kidney and ureter: Secondary | ICD-10-CM | POA: Diagnosis not present

## 2021-03-01 DIAGNOSIS — N390 Urinary tract infection, site not specified: Secondary | ICD-10-CM | POA: Diagnosis not present

## 2021-03-01 DIAGNOSIS — M1000 Idiopathic gout, unspecified site: Secondary | ICD-10-CM | POA: Diagnosis not present

## 2021-03-01 DIAGNOSIS — N1831 Chronic kidney disease, stage 3a: Secondary | ICD-10-CM | POA: Diagnosis not present

## 2021-03-02 ENCOUNTER — Ambulatory Visit (INDEPENDENT_AMBULATORY_CARE_PROVIDER_SITE_OTHER): Payer: Medicare Other

## 2021-03-02 ENCOUNTER — Encounter: Payer: Self-pay | Admitting: Internal Medicine

## 2021-03-02 DIAGNOSIS — I10 Essential (primary) hypertension: Secondary | ICD-10-CM

## 2021-03-02 DIAGNOSIS — N183 Chronic kidney disease, stage 3 unspecified: Secondary | ICD-10-CM

## 2021-03-02 DIAGNOSIS — E1122 Type 2 diabetes mellitus with diabetic chronic kidney disease: Secondary | ICD-10-CM

## 2021-03-02 NOTE — Patient Instructions (Signed)
Social Worker Visit Information  Goals we discussed today:  Goals Addressed            This Visit's Progress   . Obtain MRI       Timeframe:  Short-Term Goal Priority:  High Start Date:    5.4.22                         Expected End Date:   6.3.22                    Next planned outreach: 5.6.22  Patient Goals/Self-Care Activities . patient will:   - Engage with referral coordinator as needed Contact SW as needed prior to next scheduled call    . COMPLETED: Understand health plan benefits       Timeframe:  Short-Term Goal Priority:  High Start Date:  12.3.21                           Expected End Date:  2.12.22                     Next date of contact: 4.22.22  Patient Goals/Self-Care Activities Over the next 60 days, patient will:   - Self administers medications as prescribed -Attend all scheduled provider appointments -Contact SW as needed prior to next scheduled call         Follow Up Plan: SW will follow up with the patient over the next 3 days   [name], BSW, CDP Social Worker, Certified Dementia Practitioner [facility] [phone]

## 2021-03-02 NOTE — Chronic Care Management (AMB) (Signed)
Chronic Care Management    Social Work Note  03/02/2021 Name: [name] MRN: [id number] DOB: 1941-10-24  [name] is a 80 y.o. year old female who is a primary care patient of [name], MD. The CCM team was consulted to assist the patient with chronic disease management and/or care coordination needs related to: [employer] .   Engaged with patient by telephone for follow up visit in response to provider referral for social work chronic care management and care coordination services.   Consent to Services:  The patient was given information about Chronic Care Management services, agreed to services, and gave verbal consent prior to initiation of services.  Please see initial visit note for detailed documentation.   Patient agreed to services and consent obtained.   Assessment: Review of patient past medical history, allergies, medications, and health status, including review of relevant consultants reports was performed today as part of a comprehensive evaluation and provision of chronic care management and care coordination services.     SDOH (Social Determinants of Health) assessments and interventions performed:    Advanced Directives Status: Not addressed in this encounter.  CCM Care Plan  Allergies  Allergen Reactions  . Atorvastatin Calcium Itching  . Bee Venom Hives  . Oxycodone Other (See Comments)    hallucinations  . Vicodin [Hydrocodone-Acetaminophen] Other (See Comments)    hallucinations    Outpatient Encounter Medications as of 03/02/2021  Medication Sig Note  . allopurinol (ZYLOPRIM) 300 MG tablet TAKE 1 TABLET(300 MG) BY MOUTH DAILY   . aspirin EC 81 MG tablet Take 1 tablet (81 mg total) by mouth daily.   Marland Kitchen atenolol-chlorthalidone (TENORETIC) 50-25 MG tablet TAKE 1 TABLET BY MOUTH EVERY DAY AT 11 AM   . benazepril (LOTENSIN) 10 MG tablet Take 10 mg by mouth daily.   . Cholecalciferol (VITAMIN D3) 1.25 MG (50000 UT) CAPS Take 1 capsule by  mouth on Tuesday and Friday   . Cyanocobalamin (VITAMIN B12 PO) Take by mouth.   . Dulaglutide (TRULICITY) 2.83 TD/1.7OH SOPN INJECT 0.5 ML(0.75 MG) SUBCUTANEOUSLY EVERY WEEK IN ABDOMEN, THIGH OR UPPER ARM ROTATING INJECTION SITE 11/04/2020: Per endocrinologist patient to take trulicity every other week.   . hydrOXYzine (ATARAX/VISTARIL) 10 MG tablet Take 1 tablet (10 mg total) by mouth 3 (three) times daily as needed. (Patient not taking: No sig reported)   . Lancets (ONETOUCH DELICA PLUS [id number]) MISC USE TO CHECK BLOOD SUGAR TWICE DAILY   . levocetirizine (XYZAL) 5 MG tablet Take 1 tablet (5 mg total) by mouth every evening.   . Magnesium 250 MG TABS Take 1 tablet (250 mg total) by mouth every evening. Take with dinner meal (Patient not taking: No sig reported)   . Multiple Vitamins-Minerals (ZINC PO) Take 1 tablet by mouth daily.   [name] test strip USE TO TEST 2 TIMES DAILY   . Probiotic Product (PROBIOTIC PO) Take 1 capsule by mouth daily.    . rosuvastatin (CRESTOR) 20 MG tablet TAKE 1 TABLET(20 MG) BY MOUTH DAILY   . Vitamin D, Ergocalciferol, (DRISDOL) 1.25 MG (50000 UNIT) CAPS capsule TAKE 1 CAPSULE BY MOUTH TWICE WEEKLY ON TUESDAYS AND FRIDAYS    No facility-administered encounter medications on file as of 03/02/2021.    Patient Active Problem List   Diagnosis Date Noted  . 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 Aortic atherosclerosis ([place]) 01/03/2021  . Class 2 severe obesity due to excess calories with serious comorbidity and body mass index (BMI) of 39.0 to  39.9 in adult [facility]) 01/03/2021  . Pruritus 01/03/2021  . Vitamin D deficiency 06/01/2020  . CAP (community acquired pneumonia) 05/06/2020  . Abdominal bloating 02/17/2019  . Urinary frequency 02/17/2019  . Uncontrolled type 2 diabetes mellitus with hyperglycemia ([place]) 02/17/2019  . Lightheaded 02/17/2019  . Constipation 06/26/2018  . Hyperlipidemia 08/23/2014  . Type 2 diabetes mellitus with stage 3 chronic  kidney disease, without long-term current use of insulin ([place]) 06/19/2014  . Mixed hyperlipidemia 06/19/2014  . S/P urological surgery 09/30/2013  . Joint pain 09/30/2013  . UTI (urinary tract infection) 09/30/2013  . Cough 12/10/2012  . OA (osteoarthritis) of knee 01/05/2012  . 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 Vaginal discharge 08/02/2011  . Spasm of lumbar paraspinous muscle 06/17/2011  . Morbidly obese ([place]) 05/31/2011  . HIP PAIN, LEFT, CHRONIC 11/09/2010  . CARPAL TUNNEL SYNDROME, RIGHT 10/14/2010  . KNEE PAIN, RIGHT, CHRONIC 10/14/2010  . LEFT BUNDLE BRANCH BLOCK 07/26/2010  . 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 FATIGUE 07/01/2010  . CHEST PAIN, ATYPICAL 07/01/2010  . EPIGASTRIC PAIN 07/01/2010  . SUPRAPUBIC PAIN 07/01/2010  . HYPERGLYCEMIA 07/01/2010  . CELLULITIS AND ABSCESS OF OTHER SPECIFIED SITE 02/15/2010  . LUMP OR MASS IN BREAST 01/10/2010  . DYSURIA, CHRONIC 03/09/2009  . GERD 10/06/2008  . Personal history of colonic polyps-adenoma 08/26/2008  . LOC OSTEOARTHROS NOT SPEC PRIM/SEC LOWER LEG 09/10/2007  . GOUT, UNSPECIFIED 09/09/2007  . DIVERTICULOSIS, COLON 09/09/2007  . DIVERTICULITIS, HX OF 09/09/2007  . HYPERLIPIDEMIA 06/03/2007  . Essential hypertension 06/03/2007    Conditions to be addressed/monitored: HTN, DMII and CKD Stage III; Care Coordination  Care Plan : Social Work Care Plan  Updates made by [name] since 03/02/2021 12:00 AM    Problem: Care Coordination Resolved 03/02/2021    Goal: Access health plan benefits Completed 03/02/2021  Start Date: 10/01/2020  Expected End Date: 12/11/2020  Recent Progress: On track  Priority: High  Note:   Current Barriers:  . Financial constraints related to cost of incontinent supplies . Lacks knowledge of health plan benefit . Chronic conditions including DM II, HTN, and CKD III which put patient at increased risk of hospitalization  Social Work Clinical Goal(s):  Marland Kitchen Over the  next 45 days the patient will work with SW to obtain incontinent supplies from over the counter benefit . Over the next 70 days the patient will work with SW to become more knowledgeable of health plan benefit  CCM SW Interventions: . 1:1 collaboration with [name], MD regarding development and update of comprehensive plan of care as evidenced by provider attestation and co-signature . Inter-disciplinary care team collaboration (see longitudinal plan of care) . Successful outbound call placed to the patient to assess goal progression . Determined the patient has not received a new automatic BP cuff but is using her husbands cuff . Patient does not intend to purchase one at this time . Goal closed      Problem: Barriers to Treatment     Goal: Obtain MRI   Start Date: 03/02/2021  Expected End Date: 04/01/2021  Priority: High  Note:   Current Barriers:  . Chronic disease management support and education needs related to HTN, DM, and CKD Stage III   . Lacks knowledge of how to schedule MRI  Social Worker Clinical Goal(s):  Marland Kitchen Over the next 30 days the patient will work with primary care team to schedule an MRI  SW Interventions:  . Inter-disciplinary care team collaboration (see longitudinal  plan of care) . Collaboration with [name], MD regarding development and update of comprehensive plan of care as evidenced by provider attestation and co-signature . Successful call placed to the patient to assess for care coordination needs . Patient reports a spot on her kidney was found and she is in need of an MRI . Patient requests SW assist with scheduling appointment . Performed chart review to note referral placed to [facility] . Advised the patient SW would collaborate with referral coordinator . Collaboration with [name] at patients primary provider office to request feedback on next steps for the patient . Scheduled follow up call over the next 3  days   Patient Goals/Self-Care Activities . patient will:   -  Engage with referral coordinator as needed Contact SW as needed prior to next scheduled call  Follow Up Plan: SW will follow up with the patient over the next 3 days       Follow Up Plan: SW will follow up with patient by phone over the next 3 days      [name], BSW, CDP Social Worker, Certified Dementia Practitioner [place] [facility] [place] [facility] [phone]  Total time spent performing care coordination and/or care management activities with the patient by phone or face to face = 22 minutes.

## 2021-03-04 ENCOUNTER — Ambulatory Visit: Payer: Medicare Other

## 2021-03-04 DIAGNOSIS — E1122 Type 2 diabetes mellitus with diabetic chronic kidney disease: Secondary | ICD-10-CM

## 2021-03-04 DIAGNOSIS — I10 Essential (primary) hypertension: Secondary | ICD-10-CM

## 2021-03-04 DIAGNOSIS — N183 Chronic kidney disease, stage 3 unspecified: Secondary | ICD-10-CM

## 2021-03-04 LAB — ALLERGEN, TURMERIC, IGE
Class Interpretation: 0
Turmeric IgE*: <0.35 kU/L (ref <0.35)

## 2021-03-04 LAB — ALLERGEN, GARLIC, F47: Allergen Garlic IgE: <0.1 kU/L

## 2021-03-04 LAB — TRYPTASE: Tryptase: 8.6 ug/L (ref 2.2–13.2)

## 2021-03-04 LAB — ALLERGEN,CHILI PEPPER,RF279: F279-IgE Chili Pepper: <0.1 kU/L

## 2021-03-04 LAB — ALLERGEN, BASIL,RF269: Basil: <0.1 kU/L

## 2021-03-04 LAB — ALLERGEN, OREGANO, RF283: F283-IgE Oregano: <0.1 kU/L

## 2021-03-04 LAB — F086-IGE PARSLEY: F086-IgE Parsley: <0.1 kU/L

## 2021-03-04 LAB — ALLERGEN,GRN PEPPER,PAPRIKA,F218: Paprika IgE: <0.1 kU/L

## 2021-03-04 NOTE — Chronic Care Management (AMB) (Signed)
Chronic Care Management    Social Work Note  03/04/2021 Name: [name] MRN: [id number] DOB: [birth year]  [name] is a 80 y.o. year old female who is a primary care patient of [name], MD. The CCM team was consulted to assist the patient with chronic disease management and/or care coordination needs related to: [employer] .   Engaged with patient by telephone for follow up visit in response to provider referral for social work chronic care management and care coordination services.   Consent to Services:  The patient was given information about Chronic Care Management services, agreed to services, and gave verbal consent prior to initiation of services.  Please see initial visit note for detailed documentation.   Patient agreed to services and consent obtained.   Assessment: Review of patient past medical history, allergies, medications, and health status, including review of relevant consultants reports was performed today as part of a comprehensive evaluation and provision of chronic care management and care coordination services.     SDOH (Social Determinants of Health) assessments and interventions performed:    Advanced Directives Status: Not addressed in this encounter.  CCM Care Plan  Allergies  Allergen Reactions  . Atorvastatin Calcium Itching  . Bee Venom Hives  . Oxycodone Other (See Comments)    hallucinations  . Vicodin [Hydrocodone-Acetaminophen] Other (See Comments)    hallucinations    Outpatient Encounter Medications as of 03/04/2021  Medication Sig Note  . allopurinol (ZYLOPRIM) 300 MG tablet TAKE 1 TABLET(300 MG) BY MOUTH DAILY   . aspirin EC 81 MG tablet Take 1 tablet (81 mg total) by mouth daily.   Marland Kitchen atenolol-chlorthalidone (TENORETIC) 50-25 MG tablet TAKE 1 TABLET BY MOUTH EVERY DAY AT 11 AM   . benazepril (LOTENSIN) 10 MG tablet Take 10 mg by mouth daily.   . Cholecalciferol (VITAMIN D3) 1.25 MG (50000 UT) CAPS Take 1 capsule by  mouth on Tuesday and Friday   . Cyanocobalamin (VITAMIN B12 PO) Take by mouth.   . Dulaglutide (TRULICITY) 3.47 QQ/5.9DG SOPN INJECT 0.5 ML(0.75 MG) SUBCUTANEOUSLY EVERY WEEK IN ABDOMEN, THIGH OR UPPER ARM ROTATING INJECTION SITE 11/04/2020: Per endocrinologist patient to take trulicity every other week.   . hydrOXYzine (ATARAX/VISTARIL) 10 MG tablet Take 1 tablet (10 mg total) by mouth 3 (three) times daily as needed. (Patient not taking: No sig reported)   . Lancets (ONETOUCH DELICA PLUS [id number]) MISC USE TO CHECK BLOOD SUGAR TWICE DAILY   . levocetirizine (XYZAL) 5 MG tablet Take 1 tablet (5 mg total) by mouth every evening.   . Magnesium 250 MG TABS Take 1 tablet (250 mg total) by mouth every evening. Take with dinner meal (Patient not taking: No sig reported)   . Multiple Vitamins-Minerals (ZINC PO) Take 1 tablet by mouth daily.   [name] test strip USE TO TEST 2 TIMES DAILY   . Probiotic Product (PROBIOTIC PO) Take 1 capsule by mouth daily.    . rosuvastatin (CRESTOR) 20 MG tablet TAKE 1 TABLET(20 MG) BY MOUTH DAILY   . Vitamin D, Ergocalciferol, (DRISDOL) 1.25 MG (50000 UNIT) CAPS capsule TAKE 1 CAPSULE BY MOUTH TWICE WEEKLY ON TUESDAYS AND FRIDAYS    No facility-administered encounter medications on file as of 03/04/2021.    Patient Active Problem List   Diagnosis Date Noted  . 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 Aortic atherosclerosis ([place]) 01/03/2021  . Class 2 severe obesity due to excess calories with serious comorbidity and body mass index (BMI) of 39.0 to  39.9 in adult [facility]) 01/03/2021  . Pruritus 01/03/2021  . Vitamin D deficiency 06/01/2020  . CAP (community acquired pneumonia) 05/06/2020  . Abdominal bloating 02/17/2019  . Urinary frequency 02/17/2019  . Uncontrolled type 2 diabetes mellitus with hyperglycemia ([place]) 02/17/2019  . Lightheaded 02/17/2019  . Constipation 06/26/2018  . Hyperlipidemia 08/23/2014  . Type 2 diabetes mellitus with stage 3 chronic  kidney disease, without long-term current use of insulin ([place]) 06/19/2014  . Mixed hyperlipidemia 06/19/2014  . S/P urological surgery 09/30/2013  . Joint pain 09/30/2013  . UTI (urinary tract infection) 09/30/2013  . Cough 12/10/2012  . OA (osteoarthritis) of knee 01/05/2012  . 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 Vaginal discharge 08/02/2011  . Spasm of lumbar paraspinous muscle 06/17/2011  . Morbidly obese ([place]) 05/31/2011  . HIP PAIN, LEFT, CHRONIC 11/09/2010  . CARPAL TUNNEL SYNDROME, RIGHT 10/14/2010  . KNEE PAIN, RIGHT, CHRONIC 10/14/2010  . LEFT BUNDLE BRANCH BLOCK 07/26/2010  . 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 FATIGUE 07/01/2010  . CHEST PAIN, ATYPICAL 07/01/2010  . EPIGASTRIC PAIN 07/01/2010  . SUPRAPUBIC PAIN 07/01/2010  . HYPERGLYCEMIA 07/01/2010  . CELLULITIS AND ABSCESS OF OTHER SPECIFIED SITE 02/15/2010  . LUMP OR MASS IN BREAST 01/10/2010  . DYSURIA, CHRONIC 03/09/2009  . GERD 10/06/2008  . Personal history of colonic polyps-adenoma 08/26/2008  . LOC OSTEOARTHROS NOT SPEC PRIM/SEC LOWER LEG 09/10/2007  . GOUT, UNSPECIFIED 09/09/2007  . DIVERTICULOSIS, COLON 09/09/2007  . DIVERTICULITIS, HX OF 09/09/2007  . HYPERLIPIDEMIA 06/03/2007  . Essential hypertension 06/03/2007    Conditions to be addressed/monitored: HTN, DMII and CKD Stage III  Care Plan : Social Work Care Plan  Updates made by [name] since 03/04/2021 12:00 AM    Problem: Barriers to Treatment     Goal: Obtain MRI   Start Date: 03/02/2021  Expected End Date: 04/01/2021  This Visit's Progress: On track  Priority: High  Note:   Current Barriers:  . Chronic disease management support and education needs related to HTN, DM, and CKD Stage III   . Lacks knowledge of how to schedule MRI  Social Worker Clinical Goal(s):  Marland Kitchen Over the next 30 days the patient will work with primary care team to schedule an MRI  SW Interventions:  . Inter-disciplinary care team  collaboration (see longitudinal plan of care) . Collaboration with [name], MD regarding development and update of comprehensive plan of care as evidenced by provider attestation and co-signature . Collaboration with Andre Lefort who confirms patient referral was sent to [place] [facility] and they would contact the patient to schedule . Successful outbound call placed to the patient to provide above information . Provided the patient with business name and contact number  . Discussed the patient may call them to initiate scheduling . Scheduled follow up call over the next 14 days  Patient Goals/Self-Care Activities . patient will:   -  Engage with referral coordinator as needed Contact SW as needed prior to next scheduled call  Follow Up Plan: SW will follow up with the patient over the next 14 days       Follow Up Plan: SW will follow up with patient by phone over the next 14 days      [name], BSW, CDP Social Worker, Certified Dementia Practitioner [place] [facility] [place] [facility] [phone]  Total time spent performing care coordination and/or care management activities with the patient by phone or face to face = 12 minutes.

## 2021-03-04 NOTE — Patient Instructions (Signed)
Social Worker Visit Information  Goals we discussed today:  Goals Addressed            This Visit's Progress   . Obtain MRI   On track    Timeframe:  Short-Term Goal Priority:  High Start Date:    5.4.22                         Expected End Date:   6.3.22                    Next planned outreach: 5.18.22  Patient Goals/Self-Care Activities . patient will:   - Engage with referral coordinator as needed Contact SW as needed prior to next scheduled call        Materials Provided: Yes: Provided contact number to imaging center at patients request  Follow Up Plan: SW will follow up with patient by phone over the next 14 days   [name], BSW, CDP Social Worker, Certified Dementia Practitioner [place] [facility] [place] [facility] [phone]

## 2021-03-09 ENCOUNTER — Telehealth: Payer: Self-pay

## 2021-03-09 NOTE — Chronic Care Management (AMB) (Signed)
Called patient with appointment reminder with [name] ,[place] on 03-09-2021 at 11:00. Voicemail was too full to leave message on 2 numbers.  [place]  [phone]

## 2021-03-10 ENCOUNTER — Ambulatory Visit: Payer: Self-pay

## 2021-03-10 DIAGNOSIS — I10 Essential (primary) hypertension: Secondary | ICD-10-CM

## 2021-03-10 DIAGNOSIS — N183 Chronic kidney disease, stage 3 unspecified: Secondary | ICD-10-CM | POA: Diagnosis not present

## 2021-03-10 DIAGNOSIS — E1122 Type 2 diabetes mellitus with diabetic chronic kidney disease: Secondary | ICD-10-CM

## 2021-03-10 NOTE — Progress Notes (Signed)
Chronic Care Management Pharmacy Note  03/22/2021 Name:  [name] MRN:  [id number] DOB:  1941/02/18  Subjective: [name] is an 80 y.o. year old female who is a primary patient of [name], MD.  The CCM team was consulted for assistance with disease management and care coordination needs.  Patient reports she has been having some issues and she is scheduled to have an MRI to see what was going  on 03/16/2021. She wants to be sure that she is healthy and okay. Patient reports that she has hernia from a surgery that she had. She takes prune juice every day.   Engaged with patient by telephone for follow up visit in response to provider referral for pharmacy case management and/or care coordination services.   Consent to Services:  The patient was given information about Chronic Care Management services, agreed to services, and gave verbal consent prior to initiation of services.  Please see initial visit note for detailed documentation.   Patient Care Team: [name], MD as PCP - General (Internal Medicine) [name] as Social Worker [place], Claudette Stapler, RN as Case Manager Mayford Knife, [facility] (Pharmacist)  Recent office visits: 02/23/2021 PCP OV  Recent consult visits: 02/24/2021 [facility] visits: None in previous 6 months  Objective:  Lab Results  Component Value Date   CREATININE 0.98 02/18/2021   BUN 23 02/18/2021   GFR 53.18 (L) 06/19/2014   GFRNONAA 59 (L) 02/18/2021   GFRAA 54 (L) 06/01/2020   NA 140 02/18/2021   K 3.5 02/18/2021   CALCIUM 9.3 02/18/2021   CO2 27 02/18/2021   GLUCOSE 157 (H) 02/18/2021    Lab Results  Component Value Date/Time   HGBA1C 6.6 (H) 12/30/2020 01:00 PM   HGBA1C 6.3 (H) 09/01/2020 03:20 PM   GFR 53.18 (L) 06/19/2014 03:42 PM   GFR 41.03 (L) 06/09/2014 09:23 AM   MICROALBUR 10 03/19/2019 02:04 PM    Last diabetic Eye exam:  Lab Results  Component Value Date/Time   HMDIABEYEEXA No Retinopathy  02/03/2021 12:00 AM    Last diabetic Foot exam: No results found for: HMDIABFOOTEX   Lab Results  Component Value Date   CHOL 170 06/01/2020   HDL 38 (L) 06/01/2020   LDLCALC 103 (H) 06/01/2020   LDLDIRECT 162.2 11/21/2013   TRIG 167 (H) 06/01/2020   CHOLHDL 4.5 (H) 06/01/2020    Hepatic Function Latest Ref Rng & Units 02/18/2021 12/30/2020 08/28/2020  Total Protein 6.5 - 8.1 g/dL 6.9 6.7 6.9  Albumin 3.5 - 5.0 g/dL 3.9 4.0 3.5  AST 15 - 41 U/L 30 37 36  ALT 0 - 44 U/L _0 Alk Phosphatase 38 - 126 U/L 36(L) 49 42  Total Bilirubin 0.3 - 1.2 mg/dL 1.0 0.8 1.0  Bilirubin, Direct 0.1 - 0.5 mg/dL - - -    Lab Results  Component Value Date/Time   TSH 3.450 03/22/2020 03:29 PM   TSH 2.06 11/21/2013 11:35 AM   FREET4 1.05 03/22/2020 03:29 PM    CBC Latest Ref Rng & Units 02/18/2021 12/30/2020 08/28/2020  WBC 4.0 - 10.5 K/uL 10.0 8.9 11.0(H)  Hemoglobin 12.0 - 15.0 g/dL 13.8 14.2 13.9  Hematocrit 36.0 - 46.0 % 41.7 41.3 41.3  Platelets 150 - 400 K/uL 215 210 237    Lab Results  Component Value Date/Time   VD25OH 22.8 (L) 06/01/2020 04:09 PM    Clinical ASCVD: Yes  The 10-year ASCVD risk score Mikey Bussing DC Jr.,  et al., 2013) is: 34.7%   Values used to calculate the score:     Age: 9 years     Sex: Female     Is Non-Hispanic African American: Yes     Diabetic: Yes     Tobacco smoker: No     Systolic Blood Pressure: 655 mmHg     Is BP treated: Yes     HDL Cholesterol: 38 mg/dL     Total Cholesterol: 170 mg/dL    Depression screen [facility] 2/9 03/22/2020 12/31/2019 04/29/2019  Decreased Interest 0 0 0  Down, Depressed, Hopeless 0 0 0  PHQ - 2 Score 0 0 0  Altered sleeping - 0 -  Tired, decreased energy - 0 -  Change in appetite - 0 -  Feeling bad or failure about yourself  - 0 -  Trouble concentrating - 0 -  Moving slowly or fidgety/restless - 0 -  Suicidal thoughts - 0 -  PHQ-9 Score - 0 -  Difficult doing work/chores - Not difficult at all -  Some recent data might be  hidden     Social History   Tobacco Use  Smoking Status Never Smoker  Smokeless Tobacco Never Used   BP Readings from Last 3 Encounters:  02/24/21 (!) 160/70  02/23/21 134/76  02/18/21 (!) 154/73   Pulse Readings from Last 3 Encounters:  02/24/21 62  02/23/21 (!) 55  02/18/21 62   Wt Readings from Last 3 Encounters:  02/24/21 236 lb 6.4 oz (107.2 kg)  02/23/21 237 lb 3.2 oz (107.6 kg)  02/18/21 237 lb (107.5 kg)   BMI Readings from Last 3 Encounters:  02/24/21 37.58 kg/m  02/23/21 36.60 kg/m  02/18/21 36.57 kg/m    Assessment/Interventions: Review of patient past medical history, allergies, medications, health status, including review of consultants reports, laboratory and other test data, was performed as part of comprehensive evaluation and provision of chronic care management services.   SDOH:  (Social Determinants of Health) assessments and interventions performed: No  SDOH Screenings   Alcohol Screen: Not on file  Depression (PHQ2-9): Low Risk   . PHQ-2 Score: 0  Financial Resource Strain: Not on file  Food Insecurity: Not on file  Housing: Not on file  Physical Activity: Not on file  Social Connections: Not on file  Stress: Not on file  Tobacco Use: Low Risk   . Smoking Tobacco Use: Never Smoker  . Smokeless Tobacco Use: Never Used  Transportation Needs: Not on file    CCM Care Plan  Allergies  Allergen Reactions  . Atorvastatin Calcium Itching  . Bee Venom Hives  . Oxycodone Other (See Comments)    hallucinations  . Vicodin [Hydrocodone-Acetaminophen] Other (See Comments)    hallucinations    Medications Reviewed Today    Reviewed by Mayford Knife, RPH (Pharmacist) on 03/10/21 at 1107  Med List Status: <None>  Medication Order Taking? Sig Documenting Provider Last Dose Status Informant  allopurinol (ZYLOPRIM) 300 MG tablet [id number] Yes TAKE 1 TABLET(300 MG) BY MOUTH DAILY [name], MD Taking Active   aspirin EC 81 MG tablet  [id number] Yes Take 1 tablet (81 mg total) by mouth daily. [name], MD Taking Active Family Member  atenolol-chlorthalidone (TENORETIC) 50-25 MG tablet [id number] Yes TAKE 1 TABLET BY MOUTH EVERY DAY AT 11 AM [name], MD Taking Active   benazepril (LOTENSIN) 10 MG tablet [id number] Yes Take 10 mg by mouth daily. [provider] Taking Active   Cholecalciferol (VITAMIN D3) 1.25  MG (50000 UT) CAPS [id number] Yes Take 1 capsule by mouth on Tuesday and Friday [name], MD Taking Active   Cyanocobalamin (VITAMIN B12 PO) [id number] Yes Take by mouth. [provider] Taking Active   Dulaglutide (TRULICITY) 7.90 WI/0.9BD SOPN [id number] Yes INJECT 0.5 ML(0.75 MG) SUBCUTANEOUSLY EVERY WEEK IN ABDOMEN, THIGH OR UPPER ARM ROTATING INJECTION SITE [name], MD Taking Active            Med Note Pricilla Holm Nov 04, 2020  2:05 PM) Per endocrinologist patient to take trulicity every other week.   hydrOXYzine (ATARAX/VISTARIL) 10 MG tablet [id number] Yes Take 1 tablet (10 mg total) by mouth 3 (three) times daily as needed. [name], MD Taking Active   Lancets (ONETOUCH DELICA PLUS LNLGXQ11H) [place] [id number] Yes USE TO CHECK BLOOD SUGAR TWICE DAILY [name], MD Taking Active   levocetirizine (XYZAL) 5 MG tablet [id number] Yes Take 1 tablet (5 mg total) by mouth every evening. Kennith Gain, MD Taking Active   Magnesium 250 MG TABS [id number] Yes Take 1 tablet (250 mg total) by mouth every evening. Take with dinner meal [name], FNP Taking Active   Multiple Vitamins-Minerals (ZINC PO) [id number] Yes Take 1 tablet by mouth daily. [provider] Taking Active   [facility] VERIO test strip [id number] Yes USE TO TEST 2 TIMES DAILY [name], MD Taking Active   Probiotic Product (PROBIOTIC PO) [id number] Yes Take 1 capsule by mouth daily.  [provider] Taking Active Family Member  rosuvastatin (CRESTOR) 20 MG tablet [id number] Yes  TAKE 1 TABLET(20 MG) BY MOUTH DAILY [name], MD Taking Active   Vitamin D, Ergocalciferol, (DRISDOL) 1.25 MG (50000 UNIT) CAPS capsule [id number] Yes TAKE 1 CAPSULE BY MOUTH TWICE WEEKLY ON TUESDAYS AND Raelyn Number [name], MD Taking Active           Patient Active Problem List   Diagnosis Date Noted  . 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 Aortic atherosclerosis ([place]) 01/03/2021  . Class 2 severe obesity due to excess calories with serious comorbidity and body mass index (BMI) of 39.0 to 39.9 in adult ([place]) 01/03/2021  . Pruritus 01/03/2021  . Vitamin D deficiency 06/01/2020  . CAP (community acquired pneumonia) 05/06/2020  . Abdominal bloating 02/17/2019  . Urinary frequency 02/17/2019  . Uncontrolled type 2 diabetes mellitus with hyperglycemia ([place]) 02/17/2019  . Lightheaded 02/17/2019  . Constipation 06/26/2018  . Hyperlipidemia 08/23/2014  . Type 2 diabetes mellitus with stage 3 chronic kidney disease, without long-term current use of insulin ([place]) 06/19/2014  . Mixed hyperlipidemia 06/19/2014  . S/P urological surgery 09/30/2013  . Joint pain 09/30/2013  . UTI (urinary tract infection) 09/30/2013  . Cough 12/10/2012  . OA (osteoarthritis) of knee 01/05/2012  . 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 Vaginal discharge 08/02/2011  . Spasm of lumbar paraspinous muscle 06/17/2011  . Morbidly obese ([place]) 05/31/2011  . HIP PAIN, LEFT, CHRONIC 11/09/2010  . CARPAL TUNNEL SYNDROME, RIGHT 10/14/2010  . KNEE PAIN, RIGHT, CHRONIC 10/14/2010  . LEFT BUNDLE BRANCH BLOCK 07/26/2010  . 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 FATIGUE 07/01/2010  . CHEST PAIN, ATYPICAL 07/01/2010  . EPIGASTRIC PAIN 07/01/2010  . SUPRAPUBIC PAIN 07/01/2010  . HYPERGLYCEMIA 07/01/2010  . CELLULITIS AND ABSCESS OF OTHER SPECIFIED SITE 02/15/2010  . LUMP OR MASS IN BREAST 01/10/2010  . DYSURIA, CHRONIC 03/09/2009  . GERD 10/06/2008  . Personal history of colonic  polyps-adenoma 08/26/2008  . LOC OSTEOARTHROS NOT SPEC  PRIM/SEC LOWER LEG 09/10/2007  . GOUT, UNSPECIFIED 09/09/2007  . DIVERTICULOSIS, COLON 09/09/2007  . DIVERTICULITIS, HX OF 09/09/2007  . HYPERLIPIDEMIA 06/03/2007  . Essential hypertension 06/03/2007    Immunization History  Administered Date(s) Administered  . Fluad Quad(high Dose 65+) 09/01/2020  . Influenza Split 07/25/2012  . Influenza, High Dose Seasonal PF 10/08/2018, 10/02/2019  . Influenza,inj,Quad PF,6+ Mos 11/21/2013  . Influenza-Unspecified 07/25/2012, 11/21/2013  . Moderna SARS-COV2 Booster Vaccination 10/08/2020, 10/08/2020  . Moderna Sars-Covid-2 Vaccination 12/03/2019, 01/01/2020  . Pneumococcal Polysaccharide-23 11/21/2013  . Pneumococcal-Unspecified 11/21/2013  . Td 01/29/1999, 11/21/2013    Conditions to be addressed/monitored:  Hypertension and Diabetes  Care Plan : [place]  Updates made by Mayford Knife, RPH since 03/22/2021 12:00 AM    Problem: HTN, HLD, DM II     Long-Range Goal: Disease Management   Start Date: 03/10/2021  This Visit's Progress: On track  Note:   Current Barriers:  . Unable to independently monitor therapeutic efficacy . Unable to achieve control of blood pressure.    Pharmacist Clinical Goal(s):  Marland Kitchen Patient will achieve adherence to monitoring guidelines and medication adherence to achieve therapeutic efficacy through collaboration with PharmD and provider.   Interventions: . 1:1 collaboration with [name], MD regarding development and update of comprehensive plan of care as evidenced by provider attestation and co-signature . Inter-disciplinary care team collaboration (see longitudinal plan of care) . Comprehensive medication review performed; medication list updated in electronic medical record  Hypertension (BP goal <130/80) -Uncontrolled -Current treatment: . Atenolol -Chlorthalidone 50-25 mg tablet once per day  . Benazepril 10 mg tablet daily   -Denies hypotensive/hypertensive symptoms -Educated on BP goals and benefits of medications for prevention of heart attack, stroke and kidney damage; Daily salt intake goal < 2300 mg; Exercise goal of 150 minutes per week; Importance of home blood pressure monitoring; Proper BP monitoring technique;  -Patient reports last readings in office where  -Counseled to monitor BP at home at least three times per week, document, and provide log at future appointments -Recommended to continue current medication  Diabetes (A1c goal <7%) -Controlled -Current medications: . Trulicity 0.5 ml subcutaneously every other week.  -Current home glucose readings . fasting glucose: less than 127 . post prandial glucose: 145 -Reports hypoglycemic/hyperglycemic symptoms -Current meal patterns: patient reports that she is doing well with her diet habits  . breakfast: will discuss during next visit . lunch: will discuss during next visit . dinner: vegetables: cabbage, string beans, collard greens, she freezes a lot of her food. She is concerned about meat, she is trying to avoid pork, ham and pork chops.  . snacks: dry crackers - saltine  . drinks: drinking plenty of water  -Educated on A1c and blood sugar goals; Complications of diabetes including kidney damage, retinal damage, and cardiovascular disease; Exercise goal of 150 minutes per week; Prevention and management of hypoglycemic episodes; Benefits of routine self-monitoring of blood sugar;  -Patient reports losing a lot of weight  -Counseled to check feet daily and get yearly eye exams -Recommended to continue current medication   Patient Goals/Self-Care Activities . Patient will:  - take medications as prescribed  Follow Up Plan: The patient has been provided with contact information for the care management team and has been advised to call with any health related questions or concerns.       Medication Assistance: Trulicity obtained  through [employer] medication assistance program.  Enrollment ends 09/2021.  Patient's preferred pharmacy is:  [facility] [phone] - [place], [place] - [phone] AT [place] [address] Phone: [phone] Fax: [phone]  [facility] (New Address) - [place], [place] AT Previously: [address] [place] [address] Phone: [phone] Fax: [phone]  Uses pill box? Yes Pt endorses 85% compliance  We discussed: Benefits of medication synchronization, packaging and delivery as well as enhanced pharmacist oversight with [employer]. Patient decided to: Continue current medication management strategy  Care Plan and Follow Up Patient Decision:  Patient agrees to Care Plan and Follow-up.  Plan: The patient has been provided with contact information for the care management team and has been advised to call with any health related questions or concerns.   [name], PharmD Clinical Pharmacist [facility] [phone]

## 2021-03-14 ENCOUNTER — Ambulatory Visit (HOSPITAL_COMMUNITY)
Admission: RE | Admit: 2021-03-14 | Discharge: 2021-03-14 | Disposition: A | Discharge status: Home / Self Care | Payer: Medicare Other | Source: Ambulatory Visit | Attending: Internal Medicine | Admitting: Internal Medicine

## 2021-03-14 DIAGNOSIS — N281 Cyst of kidney, acquired: Secondary | ICD-10-CM | POA: Diagnosis not present

## 2021-03-14 DIAGNOSIS — Q639 Congenital malformation of kidney, unspecified: Secondary | ICD-10-CM | POA: Diagnosis not present

## 2021-03-14 DIAGNOSIS — K8020 Calculus of gallbladder without cholecystitis without obstruction: Secondary | ICD-10-CM | POA: Diagnosis not present

## 2021-03-14 DIAGNOSIS — J986 Disorders of diaphragm: Secondary | ICD-10-CM | POA: Diagnosis not present

## 2021-03-14 DIAGNOSIS — N2889 Other specified disorders of kidney and ureter: Secondary | ICD-10-CM | POA: Diagnosis not present

## 2021-03-14 IMAGING — MR MR ABDOMEN WO/W CM
14 of 15 series · 47 of 48 positions shown · IV contrast (10 GADAVIST)
Comparison: CTs including [DATE].

CLINICAL DATA: CT demonstrating a possible upper pole right renal
mass.

EXAM:
MRI ABDOMEN WITHOUT AND WITH CONTRAST
TECHNIQUE: Multiplanar multisequence MR imaging of the abdomen was performed
both before and after the administration of intravenous contrast.
CONTRAST:  10mL GADAVIST GADOBUTROL 1 MMOL/ML IV SOLN

[Series 3: T2 · coronal · 6.0mm · 1.56mm/px · 1 of 30 slices shown (1 of 2)]
[im 1/30]
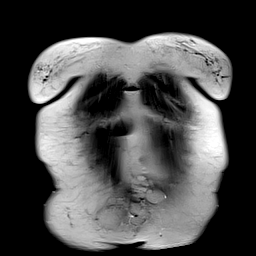

[Series 11: T1 dynamic · axial · 3.0mm · 1.25mm/px · z∈[-104,+180]mm · 3 of 96 slices shown (1 of 12)]
[im 1/96]
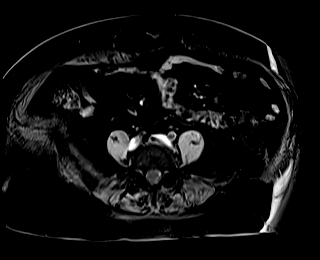
[im 48/96]
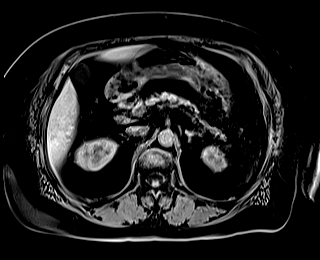
[im 96/96]
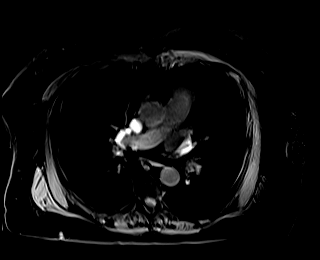

[Series 13: T1 dynamic · axial · 3.0mm · 1.25mm/px · z∈[-104,+180]mm · 3 of 96 slices shown (2 of 12)]
[im 1/96]
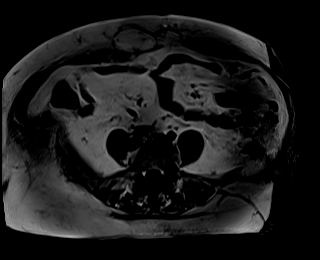
[im 48/96]
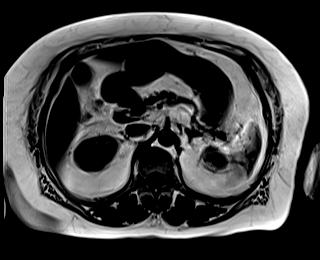
[im 96/96]
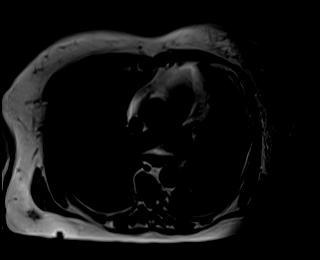

[Series 14: T1 dynamic · axial · 3.0mm · 1.25mm/px · z∈[-104,+180]mm · 3 of 96 slices shown (3 of 12)]
[im 1/96]
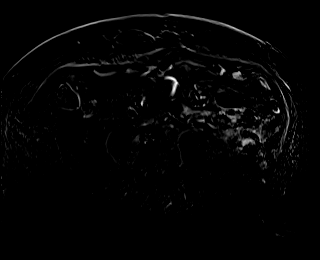
[im 48/96]
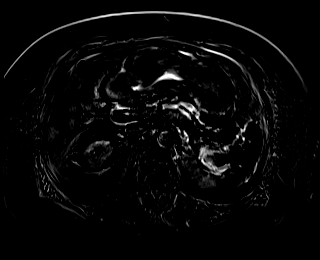
[im 96/96]
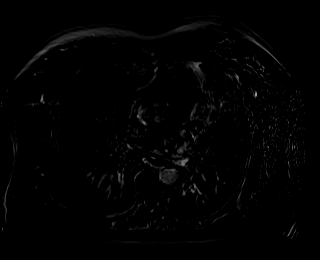

[Series 15: T1 dynamic · axial · 3.0mm · 1.25mm/px · z∈[-104,+180]mm · 4 of 96 slices shown (4 of 12)]
[im 1/96]
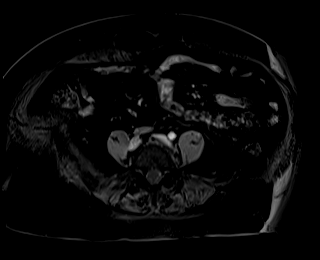
[im 32/96]
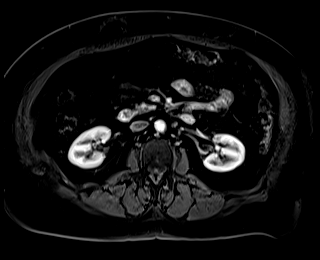
[im 64/96]
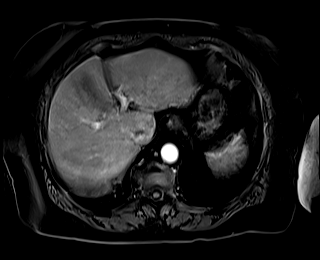
[im 96/96]
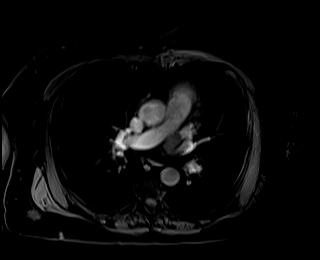

[Series 16: T1 dynamic · axial · 3.0mm · 1.25mm/px · z∈[-104,+180]mm · 4 of 96 slices shown (5 of 12)]
[im 1/96]
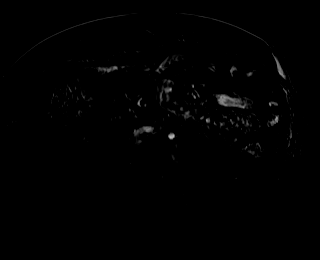
[im 32/96]
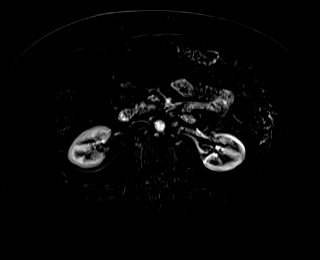
[im 64/96]
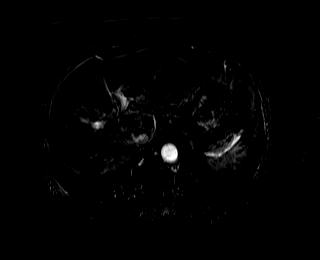
[im 96/96]
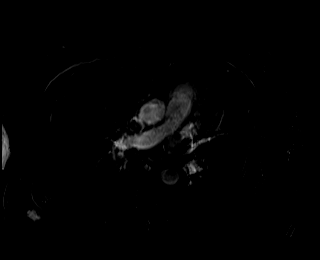

[Series 19: T1 dynamic · axial · 3.0mm · 1.25mm/px · z∈[-104,+180]mm · 4 of 96 slices shown (6 of 12)]
[im 1/96]
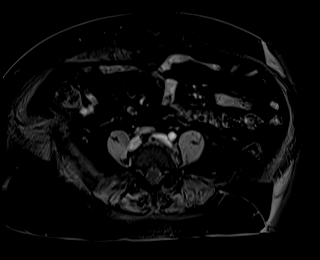
[im 32/96]
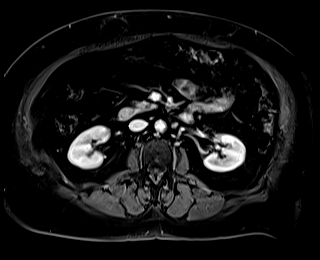
[im 64/96]
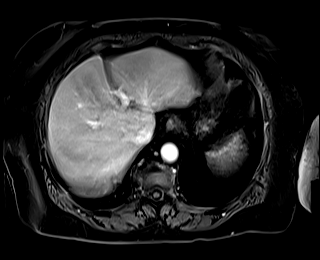
[im 96/96]
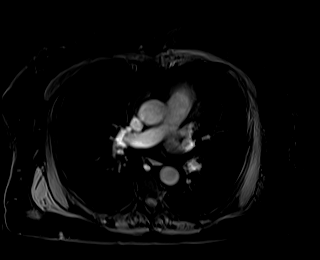

[Series 20: T1 dynamic · axial · 3.0mm · 1.25mm/px · z∈[-104,+180]mm · 4 of 96 slices shown (7 of 12)]
[im 1/96]
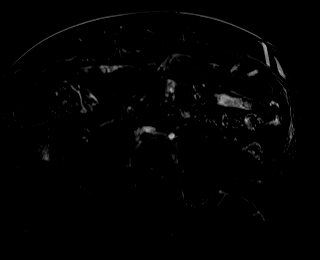
[im 32/96]
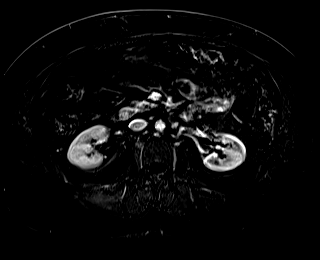
[im 64/96]
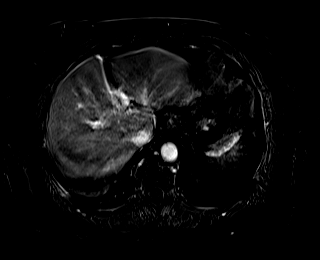
[im 96/96]
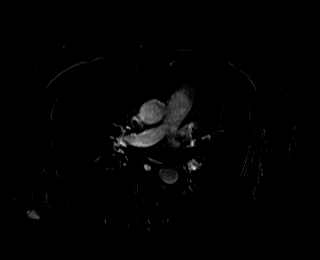

[Series 23: T1 dynamic · axial · 3.0mm · 1.25mm/px · z∈[-104,+180]mm · 4 of 96 slices shown (8 of 12)]
[im 1/96]
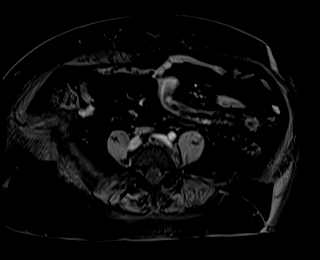
[im 32/96]
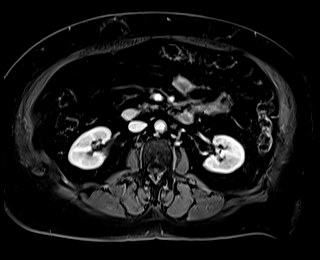
[im 64/96]
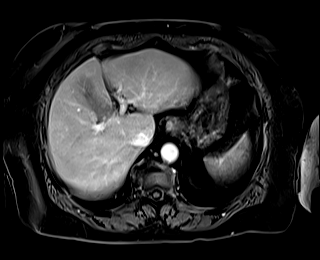
[im 96/96]
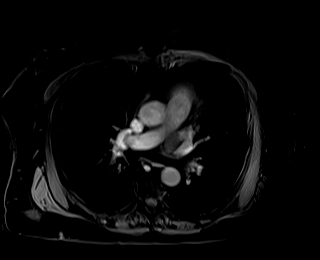

[Series 24: T1 dynamic · axial · 3.0mm · 1.25mm/px · z∈[-104,+180]mm · 4 of 96 slices shown (9 of 12)]
[im 1/96]
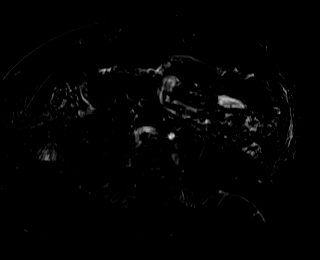
[im 32/96]
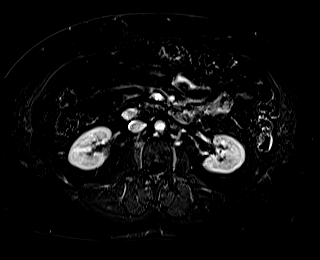
[im 64/96]
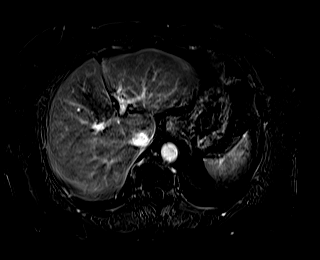
[im 96/96]
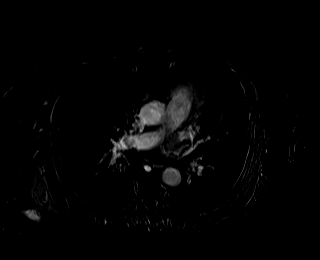

[Series 26: T1 dynamic · coronal · 3.0mm · 1.17mm/px · 3 of 72 slices shown (10 of 12)]
[im 1/72]
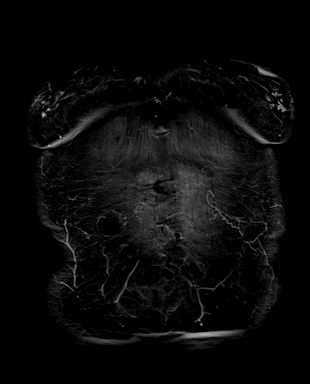
[im 36/72]
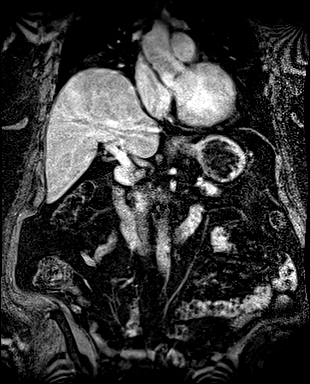
[im 72/72]
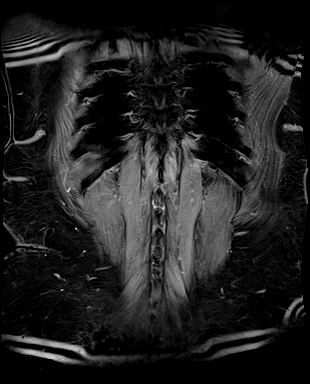

[Series 27: T2 · axial · 6.0mm · 1.56mm/px · z∈[-79,+201]mm · 2 of 40 slices shown (2 of 2)]
[im 1/40]
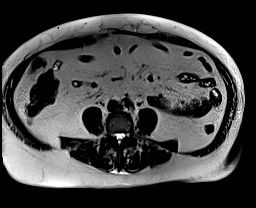
[im 40/40]
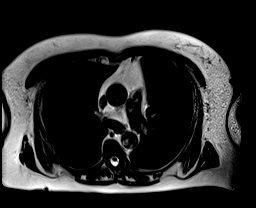

[Series 30: T1 dynamic · axial · 3.0mm · 1.25mm/px · z∈[-104,+180]mm · 4 of 96 slices shown (11 of 12)]
[im 1/96]
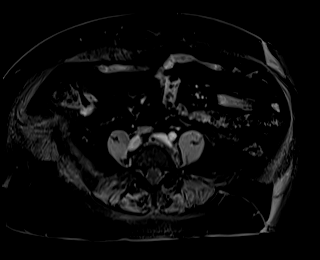
[im 32/96]
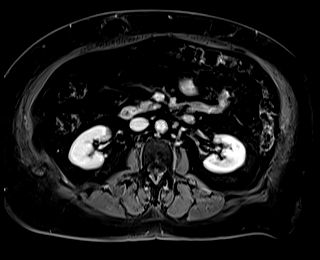
[im 64/96]
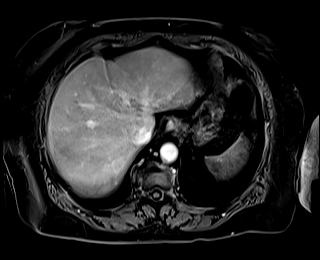
[im 96/96]
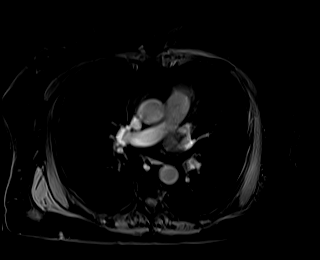

[Series 31: T1 dynamic · axial · 3.0mm · 1.25mm/px · z∈[-104,+180]mm · 4 of 96 slices shown (12 of 12)]
[im 1/96]
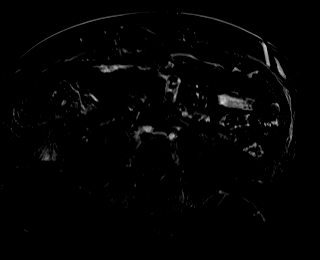
[im 32/96]
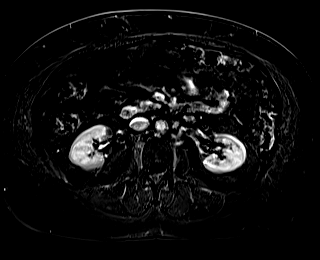
[im 64/96]
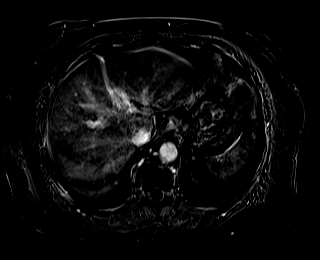
[im 96/96]
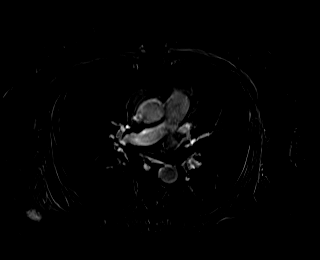

[47 of 48 positions shown; findings below may reference images not displayed]

FINDINGS: Lower chest: Mild cardiomegaly, without pericardial or pleural
effusion. Moderate right hemidiaphragm elevation.

Hepatobiliary: Normal liver. Multiple gallstones without acute
cholecystitis or biliary duct dilatation.

Pancreas: Normal pancreas for age, without duct dilatation or acute
inflammation.

Spleen:  Normal in size, without focal abnormality.

Adrenals/Urinary Tract: Normal adrenal glands. Corresponding to the
CT abnormality, within the medial upper pole right kidney is a 9 mm
lesion which is relatively isointense prior to contrast on
[DATE] and demonstrates no post-contrast enhancement, including
on subtracted image 53/20.

Tiny left renal lesions are most consistent with simple cysts. No
hydronephrosis.

Stomach/Bowel: Normal stomach and small bowel. Extensive colonic
diverticulosis.

Vascular/Lymphatic: Aortic atherosclerosis. No retroperitoneal or
retrocrural adenopathy.

Other:  No ascites.

Musculoskeletal: No acute osseous abnormality.

Portions of exam are minimally motion degraded.
IMPRESSION: 1. The right renal lesion of interest represents a minimally complex
cyst.
2.  No acute abdominal process.
3. Cholelithiasis.

## 2021-03-14 MED ORDER — GADOBUTROL 1 MMOL/ML IV SOLN
10.0000 mL | Freq: Once | INTRAVENOUS | Status: AC | PRN
  Administered 2021-03-14: 10 mL via INTRAVENOUS

## 2021-03-16 ENCOUNTER — Telehealth: Payer: Self-pay

## 2021-03-16 ENCOUNTER — Telehealth: Payer: Medicare Other

## 2021-03-16 DIAGNOSIS — R3915 Urgency of urination: Secondary | ICD-10-CM | POA: Diagnosis not present

## 2021-03-16 DIAGNOSIS — N3281 Overactive bladder: Secondary | ICD-10-CM | POA: Diagnosis not present

## 2021-03-16 DIAGNOSIS — N281 Cyst of kidney, acquired: Secondary | ICD-10-CM | POA: Diagnosis not present

## 2021-03-16 DIAGNOSIS — R350 Frequency of micturition: Secondary | ICD-10-CM | POA: Diagnosis not present

## 2021-03-16 NOTE — Telephone Encounter (Signed)
  Care Management   Follow Up Note   03/16/2021 Name: [name] MRN: [id number] DOB: January 27, 1941   Referred by: [name], MD Reason for referral : Chronic Care Management   Sw placed a successful outbound call to the patient to follow up on care coordination needs. Patient reports she is unable to complete the call at this time and requests her appointment be rescheduled.  Follow Up Plan: Telephone follow up appointment with care management team member scheduled for:03/21/21  [name], BSW, CDP Social Worker, Certified Dementia Practitioner [place] [facility] [place] [facility] [phone]

## 2021-03-21 ENCOUNTER — Ambulatory Visit: Payer: Medicare Other

## 2021-03-21 ENCOUNTER — Telehealth: Payer: Self-pay

## 2021-03-21 DIAGNOSIS — I10 Essential (primary) hypertension: Secondary | ICD-10-CM

## 2021-03-21 DIAGNOSIS — E1122 Type 2 diabetes mellitus with diabetic chronic kidney disease: Secondary | ICD-10-CM

## 2021-03-21 DIAGNOSIS — N183 Chronic kidney disease, stage 3 unspecified: Secondary | ICD-10-CM

## 2021-03-21 NOTE — Chronic Care Management (AMB) (Signed)
Chronic Care Management    Social Work Note  03/21/2021 Name: [name] MRN: [id number] DOB: 10/18/1941  [name] is a 80 y.o. year old female who is a primary care patient of [name], MD. The CCM team was consulted to assist the patient with chronic disease management and/or care coordination needs related to: [employer] .   Engaged with patient by telephone for follow up visit in response to provider referral for social work chronic care management and care coordination services.   Consent to Services:  The patient was given information about Chronic Care Management services, agreed to services, and gave verbal consent prior to initiation of services.  Please see initial visit note for detailed documentation.   Patient agreed to services and consent obtained.   Assessment: Review of patient past medical history, allergies, medications, and health status, including review of relevant consultants reports was performed today as part of a comprehensive evaluation and provision of chronic care management and care coordination services.     SDOH (Social Determinants of Health) assessments and interventions performed:    Advanced Directives Status: Not addressed in this encounter.  CCM Care Plan  Allergies  Allergen Reactions  . Atorvastatin Calcium Itching  . Bee Venom Hives  . Oxycodone Other (See Comments)    hallucinations  . Vicodin [Hydrocodone-Acetaminophen] Other (See Comments)    hallucinations    Outpatient Encounter Medications as of 03/21/2021  Medication Sig Note  . allopurinol (ZYLOPRIM) 300 MG tablet TAKE 1 TABLET(300 MG) BY MOUTH DAILY   . aspirin EC 81 MG tablet Take 1 tablet (81 mg total) by mouth daily.   Marland Kitchen atenolol-chlorthalidone (TENORETIC) 50-25 MG tablet TAKE 1 TABLET BY MOUTH EVERY DAY AT 11 AM   . benazepril (LOTENSIN) 10 MG tablet Take 10 mg by mouth daily.   . Cyanocobalamin (VITAMIN B12 PO) Take by mouth.   . Dulaglutide  (TRULICITY) 7.82 NF/6.2ZH SOPN INJECT 0.5 ML(0.75 MG) SUBCUTANEOUSLY EVERY WEEK IN ABDOMEN, THIGH OR UPPER ARM ROTATING INJECTION SITE 11/04/2020: Per endocrinologist patient to take trulicity every other week.   . hydrOXYzine (ATARAX/VISTARIL) 10 MG tablet Take 1 tablet (10 mg total) by mouth 3 (three) times daily as needed.   . Lancets (ONETOUCH DELICA PLUS [id number]) MISC USE TO CHECK BLOOD SUGAR TWICE DAILY   . levocetirizine (XYZAL) 5 MG tablet Take 1 tablet (5 mg total) by mouth every evening.   . Magnesium 250 MG TABS Take 1 tablet (250 mg total) by mouth every evening. Take with dinner meal   . Multiple Vitamins-Minerals (ZINC PO) Take 1 tablet by mouth daily.   [name] test strip USE TO TEST 2 TIMES DAILY   . Probiotic Product (PROBIOTIC PO) Take 1 capsule by mouth daily.    . rosuvastatin (CRESTOR) 20 MG tablet TAKE 1 TABLET(20 MG) BY MOUTH DAILY   . Vitamin D, Ergocalciferol, (DRISDOL) 1.25 MG (50000 UNIT) CAPS capsule TAKE 1 CAPSULE BY MOUTH TWICE WEEKLY ON TUESDAYS AND FRIDAYS    No facility-administered encounter medications on file as of 03/21/2021.    Patient Active Problem List   Diagnosis Date Noted  . 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 Aortic atherosclerosis ([place]) 01/03/2021  . Class 2 severe obesity due to excess calories with serious comorbidity and body mass index (BMI) of 39.0 to 39.9 in adult ([place]) 01/03/2021  . Pruritus 01/03/2021  . Vitamin D deficiency 06/01/2020  . CAP (community acquired pneumonia) 05/06/2020  . Abdominal bloating 02/17/2019  . Urinary frequency 02/17/2019  .  Uncontrolled type 2 diabetes mellitus with hyperglycemia ([place]) 02/17/2019  . Lightheaded 02/17/2019  . Constipation 06/26/2018  . Hyperlipidemia 08/23/2014  . Type 2 diabetes mellitus with stage 3 chronic kidney disease, without long-term current use of insulin ([place]) 06/19/2014  . Mixed hyperlipidemia 06/19/2014  . S/P urological surgery 09/30/2013  . Joint pain 09/30/2013   . UTI (urinary tract infection) 09/30/2013  . Cough 12/10/2012  . OA (osteoarthritis) of knee 01/05/2012  . 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 Vaginal discharge 08/02/2011  . Spasm of lumbar paraspinous muscle 06/17/2011  . Morbidly obese ([place]) 05/31/2011  . HIP PAIN, LEFT, CHRONIC 11/09/2010  . CARPAL TUNNEL SYNDROME, RIGHT 10/14/2010  . KNEE PAIN, RIGHT, CHRONIC 10/14/2010  . LEFT BUNDLE BRANCH BLOCK 07/26/2010  . 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 FATIGUE 07/01/2010  . CHEST PAIN, ATYPICAL 07/01/2010  . EPIGASTRIC PAIN 07/01/2010  . SUPRAPUBIC PAIN 07/01/2010  . HYPERGLYCEMIA 07/01/2010  . CELLULITIS AND ABSCESS OF OTHER SPECIFIED SITE 02/15/2010  . LUMP OR MASS IN BREAST 01/10/2010  . DYSURIA, CHRONIC 03/09/2009  . GERD 10/06/2008  . Personal history of colonic polyps-adenoma 08/26/2008  . LOC OSTEOARTHROS NOT SPEC PRIM/SEC LOWER LEG 09/10/2007  . GOUT, UNSPECIFIED 09/09/2007  . DIVERTICULOSIS, COLON 09/09/2007  . DIVERTICULITIS, HX OF 09/09/2007  . HYPERLIPIDEMIA 06/03/2007  . Essential hypertension 06/03/2007    Conditions to be addressed/monitored: HTN, DMII and CKD Stage III; Care Coordination  Care Plan : Social Work Care Plan  Updates made by [name] since 03/21/2021 12:00 AM    Problem: Barriers to Treatment     Goal: Obtain MRI   Start Date: 03/02/2021  Expected End Date: 04/01/2021  This Visit's Progress: On track  Recent Progress: On track  Priority: High  Note:   Current Barriers:  . Chronic disease management support and education needs related to HTN, DM, and CKD Stage III   . Lacks knowledge of how to schedule MRI  Social Worker Clinical Goal(s):  Marland Kitchen Over the next 30 days the patient will work with primary care team to schedule an MRI Goal Met . Over the next 30 days the patient will follow up with her primary care provider to discuss MRI results as directed by SW  SW Interventions:  . Inter-disciplinary care team  collaboration (see longitudinal plan of care) . Collaboration with [name], MD regarding development and update of comprehensive plan of care as evidenced by provider attestation and co-signature . Inbound call received from patient in response to unsuccessful call placed by SW . Determined the patient did complete her MRI but is having difficulty understanding results . Performed chart review to note patient received communication from primary provider regarding results - patient reports she is unsure how to interpret the communication  . Reviewed the communication with the patient indicating Dr. Baird Cancer inquired if the patient would like a referral to a Urologist for ongoing evaluation . Determined the patient is actively seeing Dr. Gloriann Loan for Urology needs - patient reports she was recently prescribed a new medication by Dr. Gloriann Loan to help with bladder urgency - patient reports it is helping . Discussed the patients MRI results were not available during her last Urology visit - patient has an upcomming visit planned for 6/23 at 10:45 am . Patient reports she would like to speak with Dr. Baird Cancer about her MRI results to get Dr. Baird Cancer opinion on what treatment may be needed . Performed chart review to note patient has an [place] scheduled  with Dr. Baird Cancer on 6.14.22 . Advised the patient Dr. Baird Cancer may want the patient to obtain recommendation from her Urologist but this writer will collaborate with Dr. Baird Cancer to advise of patients desire . Collaboration with Dr. Baird Cancer to advise the patient requests feedback on what treatment, if any, may need to be perused following MRI results . Scheduled follow up call over the next 45 days Patient Goals/Self-Care Activities . patient will:   -  Attend all scheduled provider appointments Contact SW as needed prior to next scheduled call  Follow Up Plan: SW will follow up with the patient over the next 45 days       Follow Up Plan: SW will  follow up with patient by phone over the next 45 days      [name], BSW, CDP Social Worker, Certified Dementia Practitioner [place] [facility] [phone]  Total time spent performing care coordination and/or care management activities with the patient by phone or face to face = 26 minutes.

## 2021-03-21 NOTE — Telephone Encounter (Signed)
  Care Management   Follow Up Note   03/21/2021 Name: [name] MRN: [id number] DOB: May 29, 1941   Referred by: [name], MD Reason for referral : Chronic Care Management (Unsuccessful call)   An unsuccessful telephone outreach was attempted today. The patient was referred to the case management team for assistance with care management and care coordination.   Follow Up Plan: The care management team will reach out to the patient again over the next 21 days.   [name], BSW, CDP Social Worker, Certified Dementia Practitioner [place] [facility] [place] [facility] [phone]

## 2021-03-21 NOTE — Patient Instructions (Signed)
Social Worker Visit Information  Goals we discussed today:  Goals Addressed            This Visit's Progress   . Obtain MRI   On track    Timeframe:  Short-Term Goal Priority:  High Start Date:    5.4.22                         Expected End Date:   6.3.22                    Next planned outreach: 6.29.22  Patient Goals/Self-Care Activities . patient will:   - Attend all scheduled provider appointments Contact SW as needed prior to next scheduled call        Follow Up Plan: SW will follow up with patient by phone over the next 45 days   [name], BSW, CDP Social Worker, Certified Dementia Practitioner [place] [facility] [phone]

## 2021-03-22 NOTE — Patient Instructions (Signed)
Visit Information It was great speaking with you today!  Please let me know if you have any questions about our visit.  Goals Addressed            This Visit's Progress   . Manage My Medicine       Timeframe:  Long-Range Goal Priority:  High Start Date:                             Expected End Date:                       Follow Up Date 07/20/2021   - call for medicine refill 2 or 3 days before it runs out - call if I am sick and can't take my medicine - keep a list of all the medicines I take; vitamins and herbals too - use an alarm clock or phone to remind me to take my medicine    Why is this important?   . These steps will help you keep on track with your medicines.          Patient Care Plan: CCM Pharmacy Care Plan    Problem Identified: HTN, HLD, DM II     Long-Range Goal: Disease Management   Start Date: 03/10/2021  This Visit's Progress: On track  Note:   Current Barriers:  . Unable to independently monitor therapeutic efficacy . Unable to achieve control of blood pressure.    Pharmacist Clinical Goal(s):  Marland Kitchen Patient will achieve adherence to monitoring guidelines and medication adherence to achieve therapeutic efficacy through collaboration with PharmD and provider.   Interventions: . 1:1 collaboration with [name], MD regarding development and update of comprehensive plan of care as evidenced by provider attestation and co-signature . Inter-disciplinary care team collaboration (see longitudinal plan of care) . Comprehensive medication review performed; medication list updated in electronic medical record  Hypertension (BP goal <130/80) -Uncontrolled -Current treatment: . Atenolol -Chlorthalidone 50-25 mg tablet once per day  . Benazepril 10 mg tablet daily  -Denies hypotensive/hypertensive symptoms -Educated on BP goals and benefits of medications for prevention of heart attack, stroke and kidney damage; Daily salt intake goal < 2300  mg; Exercise goal of 150 minutes per week; Importance of home blood pressure monitoring; Proper BP monitoring technique;  -Patient reports last readings in office where  -Counseled to monitor BP at home at least three times per week, document, and provide log at future appointments -Recommended to continue current medication  Diabetes (A1c goal <7%) -Controlled -Current medications: . Trulicity 0.5 ml subcutaneously every other week.  -Current home glucose readings . fasting glucose: less than 127 . post prandial glucose: 145 -Reports hypoglycemic/hyperglycemic symptoms -Current meal patterns: patient reports that she is doing well with her diet habits  . breakfast: will discuss during next visit . lunch: will discuss during next visit . dinner: vegetables: cabbage, string beans, collard greens, she freezes a lot of her food. She is concerned about meat, she is trying to avoid pork, ham and pork chops.  . snacks: dry crackers - saltine  . drinks: drinking plenty of water  -Educated on A1c and blood sugar goals; Complications of diabetes including kidney damage, retinal damage, and cardiovascular disease; Exercise goal of 150 minutes per week; Prevention and management of hypoglycemic episodes; Benefits of routine self-monitoring of blood sugar;  -Patient reports losing a lot of weight  -Counseled to check feet daily and get  yearly eye exams -Recommended to continue current medication   Patient Goals/Self-Care Activities . Patient will:  - take medications as prescribed  Follow Up Plan: The patient has been provided with contact information for the care management team and has been advised to call with any health related questions or concerns.        Patient agreed to services and verbal consent obtained.   The patient verbalized understanding of instructions, educational materials, and care plan provided today and agreed to receive a mailed copy of patient instructions,  educational materials, and care plan.   [name], PharmD Clinical Pharmacist [facility] [phone]

## 2021-03-25 ENCOUNTER — Other Ambulatory Visit: Payer: Self-pay | Admitting: Internal Medicine

## 2021-03-30 ENCOUNTER — Other Ambulatory Visit: Payer: Self-pay | Admitting: Internal Medicine

## 2021-04-02 ENCOUNTER — Other Ambulatory Visit: Payer: Self-pay | Admitting: Internal Medicine

## 2021-04-07 ENCOUNTER — Ambulatory Visit: Payer: Medicare Other

## 2021-04-07 ENCOUNTER — Telehealth: Payer: Self-pay

## 2021-04-07 NOTE — Telephone Encounter (Signed)
This nurse attempted to call patient in regards to missed AWV. Message left for patient to call back.

## 2021-04-08 ENCOUNTER — Telehealth: Payer: Medicare Other

## 2021-04-12 ENCOUNTER — Encounter: Payer: Self-pay | Admitting: Internal Medicine

## 2021-04-12 ENCOUNTER — Telehealth: Payer: Self-pay

## 2021-04-12 ENCOUNTER — Ambulatory Visit (INDEPENDENT_AMBULATORY_CARE_PROVIDER_SITE_OTHER): Payer: Medicare Other | Admitting: Internal Medicine

## 2021-04-12 ENCOUNTER — Other Ambulatory Visit: Payer: Self-pay | Admitting: Internal Medicine

## 2021-04-12 ENCOUNTER — Other Ambulatory Visit: Payer: Self-pay

## 2021-04-12 VITALS — BP 150/78 | HR 62 | Temp 98.5°F | Ht 66.5 in | Wt 235.2 lb

## 2021-04-12 DIAGNOSIS — N1831 Chronic kidney disease, stage 3a: Secondary | ICD-10-CM | POA: Diagnosis not present

## 2021-04-12 DIAGNOSIS — I129 Hypertensive chronic kidney disease with stage 1 through stage 4 chronic kidney disease, or unspecified chronic kidney disease: Secondary | ICD-10-CM | POA: Diagnosis not present

## 2021-04-12 DIAGNOSIS — Z6837 Body mass index (BMI) 37.0-37.9, adult: Secondary | ICD-10-CM

## 2021-04-12 DIAGNOSIS — E1122 Type 2 diabetes mellitus with diabetic chronic kidney disease: Secondary | ICD-10-CM

## 2021-04-12 DIAGNOSIS — R140 Abdominal distension (gaseous): Secondary | ICD-10-CM | POA: Diagnosis not present

## 2021-04-12 DIAGNOSIS — R109 Unspecified abdominal pain: Secondary | ICD-10-CM | POA: Diagnosis not present

## 2021-04-12 DIAGNOSIS — K921 Melena: Secondary | ICD-10-CM

## 2021-04-12 LAB — HEMOGLOBIN A1C
Est. average glucose Bld gHb Est-mCnc: 134 mg/dL
Hgb A1c MFr Bld: 6.3 % — ABNORMAL HIGH (ref 4.8–5.6)

## 2021-04-12 LAB — BMP8+EGFR
BUN/Creatinine Ratio: 21 (ref 12–28)
BUN: 23 mg/dL (ref 8–27)
CO2: 24 mmol/L (ref 20–29)
Calcium: 9.7 mg/dL (ref 8.7–10.3)
Chloride: 98 mmol/L (ref 96–106)
Creatinine, Ser: 1.11 mg/dL — ABNORMAL HIGH (ref 0.57–1.00)
Glucose: 92 mg/dL (ref 65–99)
Potassium: 4.4 mmol/L (ref 3.5–5.2)
Sodium: 138 mmol/L (ref 134–144)
eGFR: 51 mL/min/{1.73_m2} — ABNORMAL LOW (ref >59)

## 2021-04-12 LAB — CBC
Hematocrit: 41.6 % (ref 34.0–46.6)
Hemoglobin: 13.9 g/dL (ref 11.1–15.9)
MCH: 30.5 pg (ref 26.6–33.0)
MCHC: 33.4 g/dL (ref 31.5–35.7)
MCV: 91 fL (ref 79–97)
Platelets: 211 10*3/uL (ref 150–450)
RBC: 4.56 x10E6/uL (ref 3.77–5.28)
RDW: 13.4 % (ref 11.7–15.4)
WBC: 9.3 10*3/uL (ref 3.4–10.8)

## 2021-04-12 NOTE — Chronic Care Management (AMB) (Addendum)
Chronic Care Management Pharmacy Assistant   Name: [name]  MRN: [id number] DOB: [birth year]   Reason for Encounter: Disease State/ Hypertension   Recent office visits:  03-21-2021 [name] (CCM)  04-12-2021 Cheryl Chard, MD  Recent consult visits:  None  Hospital visits:  None in previous 6 months  Medications: Outpatient Encounter Medications as of 04/12/2021  Medication Sig Note   allopurinol (ZYLOPRIM) 300 MG tablet TAKE 1 TABLET(300 MG) BY MOUTH DAILY    aspirin EC 81 MG tablet Take 1 tablet (81 mg total) by mouth daily.    atenolol-chlorthalidone (TENORETIC) 50-25 MG tablet TAKE 1 TABLET BY MOUTH DAILY    benazepril (LOTENSIN) 10 MG tablet Take 10 mg by mouth daily.    Cyanocobalamin (VITAMIN B12 PO) Take by mouth.    Dulaglutide (TRULICITY) 0.34 VQ/2.5ZD SOPN INJECT 0.5 ML(0.75 MG) SUBCUTANEOUSLY EVERY WEEK IN ABDOMEN, THIGH OR UPPER ARM ROTATING INJECTION SITE 11/04/2020: Per endocrinologist patient to take trulicity every other week.    hydrOXYzine (ATARAX/VISTARIL) 10 MG tablet Take 1 tablet (10 mg total) by mouth 3 (three) times daily as needed.    Lancets (ONETOUCH DELICA PLUS [id number]) MISC USE TO CHECK BLOOD SUGAR TWICE DAILY    levocetirizine (XYZAL) 5 MG tablet Take 1 tablet (5 mg total) by mouth every evening.    Magnesium 250 MG TABS Take 1 tablet (250 mg total) by mouth every evening. Take with dinner meal    Multiple Vitamins-Minerals (ZINC PO) Take 1 tablet by mouth daily.    ONETOUCH VERIO test strip USE TO TEST 2 TIMES DAILY    Probiotic Product (PROBIOTIC PO) Take 1 capsule by mouth daily.     rosuvastatin (CRESTOR) 20 MG tablet TAKE 1 TABLET(20 MG) BY MOUTH DAILY    Vitamin D, Ergocalciferol, (DRISDOL) 1.25 MG (50000 UNIT) CAPS capsule TAKE 1 CAPSULE BY MOUTH TWICE WEEKLY ON TUESDAYS AND FRIDAYS    No facility-administered encounter medications on file as of 04/12/2021.   Reviewed chart prior to disease state call. Spoke with patient  regarding BP  Recent Office Vitals: BP Readings from Last 3 Encounters:  04/12/21 (!) 150/78  02/24/21 (!) 160/70  02/23/21 134/76   Pulse Readings from Last 3 Encounters:  04/12/21 62  02/24/21 62  02/23/21 (!) 55    Wt Readings from Last 3 Encounters:  04/12/21 235 lb 3.2 oz (106.7 kg)  02/24/21 236 lb 6.4 oz (107.2 kg)  02/23/21 237 lb 3.2 oz (107.6 kg)     Kidney Function Lab Results  Component Value Date/Time   CREATININE 0.98 02/18/2021 03:09 PM   CREATININE 1.10 (H) 12/30/2020 01:00 PM   GFR 53.18 (L) 06/19/2014 03:42 PM   GFRNONAA 59 (L) 02/18/2021 03:09 PM   GFRAA 54 (L) 06/01/2020 04:09 PM    BMP Latest Ref Rng & Units 02/18/2021 12/30/2020 08/28/2020  Glucose 70 - 99 mg/dL 157(H) 98 95  BUN 8 - 23 mg/dL 23 15 27(H)  Creatinine 0.44 - 1.00 mg/dL 0.98 1.10(H) 1.21(H)  BUN/Creat Ratio 12 - 28 - 14 -  Sodium 135 - 145 mmol/L 140 144 138  Potassium 3.5 - 5.1 mmol/L 3.5 4.5 3.3(L)  Chloride 98 - 111 mmol/L 103 102 101  CO2 22 - 32 mmol/L 27 24 25   Calcium 8.9 - 10.3 mg/dL 9.3 9.9 9.0    Current antihypertensive regimen:  Benazepril 10 mg daily Atenolol-chlorthalidone 50-25 mg daily.   How often are you checking your Blood Pressure? daily  Current home BP readings:  150/78  What recent interventions/DTPs have been made by any provider to improve Blood Pressure control since last CPP Visit: Patent's husband states she is checking blood pressure daily and taking medications as directed  Any recent hospitalizations or ED visits since last visit with CPP? No  What diet changes have been made to improve Blood Pressure Control?  Patient's husband states she has limited her salt intake.  What exercise is being done to improve your Blood Pressure Control?  Patient husband states she cleans around the house.  Adherence Review: Is the patient currently on ACE/ARB medication? No Does the patient have >5 day gap between last estimated fill dates? No  Star Rating  Drugs: Rosuvastatin 20 mg- Last filled 03-06-2021 90 DS [facility] Trulicity 2.84 mg- Last filled 02-07-2019 28 DS [facility] Benazepril 10 mg- Last filled 03-16-2021 90 DS [facility] Clinical Pharmacist Assistant [phone]

## 2021-04-12 NOTE — Patient Instructions (Signed)
Cooking With Less Salt Cooking with less salt is one way to reduce the amount of sodium you get from food. Sodium is one of the elements that make up salt. It is found naturally in foods and is also added to certain foods. Depending on your condition and overall health, your health care provider or dietitian may recommend that you reduce your sodium intake. Most people should have less than 2,300 milligrams (mg) of sodium each day. If you have high blood pressure (hypertension), you may need to limit your sodium to 1,500 mg each day. Follow the tipsbelow to help reduce your sodium intake. What are tips for eating less sodium? Reading food labels  Check the food label before buying or using packaged ingredients. Always check the label for the serving size and sodium content. Look for products with no more than 140 mg of sodium in one serving. Check the % Daily Value column to see what percent of the daily recommended amount of sodium is provided in one serving of the product. Foods with 5% or less in this column are considered low in sodium. Foods with 20% or higher are considered high in sodium. Do not choose foods with salt as one of the first three ingredients on the ingredients list. If salt is one of the first three ingredients, it usually means the item is high in sodium.  Shopping Buy sodium-free or low-sodium products. Look for the following words on food labels: Low-sodium. Sodium-free. Reduced-sodium. No salt added. Unsalted. Always check the sodium content even if foods are labeled as low-sodium or no salt added. Buy fresh foods. Cooking Use herbs, seasonings without salt, and spices as substitutes for salt. Use sodium-free baking soda when baking. Grill, braise, or roast foods to add flavor with less salt. Avoid adding salt to pasta, rice, or hot cereals. Drain and rinse canned vegetables, beans, and meat before use. Avoid adding salt when cooking sweets and desserts. Cook with  low-sodium ingredients. What foods are high in sodium? Vegetables Regular canned vegetables (not low-sodium or reduced-sodium). Sauerkraut, pickled vegetables, and relishes. Olives. French fries. Onion rings. Regular canned tomato sauce and paste. Regular tomato and vegetable juice. Frozenvegetables in sauces. Grains Instant hot cereals. Bread stuffing, pancake, and biscuit mixes. Croutons. Seasoned rice or pasta mixes. Noodle soup cups. Boxed or frozen macaroni and cheese. Regular salted crackers. Self-rising flour. Rolls. Bagels. Flourtortillas and wraps. Meats and other proteins Meat or fish that is salted, canned, smoked, cured, spiced, or pickled. This includes bacon, ham, sausages, hot dogs, corned beef, chipped beef, meat loaves, salt pork, jerky, pickled herring, anchovies, regular canned tuna, andsardines. Salted nuts. Dairy Processed cheese and cheese spreads. Cheese curds. Blue cheese. Feta cheese.String cheese. Regular cottage cheese. Buttermilk. Canned milk. The items listed above may not be a complete list of foods high in sodium. Actual amounts of sodium may be different depending on processing. Contact a dietitian for more information. What foods are low in sodium? Fruits Fresh, frozen, or canned fruit with no sauce added. Fruit juice. Vegetables Fresh or frozen vegetables with no sauce added. "No salt added" canned vegetables. "No salt added" tomato sauce and paste. Low-sodium orreduced-sodium tomato and vegetable juice. Grains Noodles, pasta, quinoa, rice. Shredded or puffed wheat or puffed rice. Regular or quick oats (not instant). Low-sodium crackers. Low-sodium bread. Whole-grainbread and whole-grain pasta. Unsalted popcorn. Meats and other proteins Fresh or frozen whole meats, poultry (not injected with sodium), and fish with no sauce added. Unsalted nuts. Dried peas, beans, and   lentils without added salt. Unsalted canned beans. Eggs. Unsalted nut butters. Low-sodium canned  tunaor chicken. Dairy Milk. Soy milk. Yogurt. Low-sodium cheeses, such as Swiss, Monterey Jack, mozzarella, and ricotta. Sherbet or ice cream (keep to  cup per serving).Cream cheese. Fats and oils Unsalted butter or margarine. Other foods Homemade pudding. Sodium-free baking soda and baking powder. Herbs and spices.Low-sodium seasoning mixes. Beverages Coffee and tea. Carbonated beverages. The items listed above may not be a complete list of foods low in sodium. Actual amounts of sodium may be different depending on processing. Contact a dietitian for more information. What are some salt alternatives when cooking? The following are herbs, seasonings, and spices that can be used instead of salt to flavor your food. Herbs should be fresh or dried. Do not choose packaged mixes. Next to the name of the herb, spice, or seasoning aresome examples of foods you can pair it with. Herbs [place] leaves - Soups, meat and vegetable dishes, and spaghetti sauce. Basil - Italian dishes, soups, pasta, and fish dishes. Cilantro - Meat, poultry, and vegetable dishes. Chili powder - Marinades and Mexican dishes. Chives - Salad dressings and potato dishes. Cumin - Mexican dishes, couscous, and meat dishes. Dill - Fish dishes, sauces, and salads. Fennel - Meat and vegetable dishes, breads, and cookies. Garlic (do not use garlic salt) - Italian dishes, meat dishes, salad dressings, and sauces. Marjoram - Soups, potato dishes, and meat dishes. Oregano - Pizza and spaghetti sauce. Parsley - Salads, soups, pasta, and meat dishes. Rosemary - Italian dishes, salad dressings, soups, and red meats. Saffron - Fish dishes, pasta, and some poultry dishes. Sage - Stuffings and sauces. Tarragon - Fish and poultry dishes. Thyme - Stuffing, meat, and fish dishes. Seasonings Lemon juice - Fish dishes, poultry dishes, vegetables, and salads. Vinegar - Salad dressings, vegetables, and fish dishes. Spices Cinnamon - Sweet  dishes, such as cakes, cookies, and puddings. Cloves - Gingerbread, puddings, and marinades for meats. Curry - Vegetable dishes, fish and poultry dishes, and stir-fry dishes. Ginger - Vegetable dishes, fish dishes, and stir-fry dishes. Nutmeg - Pasta, vegetables, poultry, fish dishes, and custard. Summary Cooking with less salt is one way to reduce the amount of sodium that you get from food. Buy sodium-free or low-sodium products. Check the food label before using or buying packaged ingredients. Use herbs, seasonings without salt, and spices as substitutes for salt in foods. This information is not intended to replace advice given to you by your health care provider. Make sure you discuss any questions you have with your healthcare provider. Document Revised: 10/08/2019 Document Reviewed: 10/08/2019 Elsevier Patient Education  2022 Elsevier Inc.  

## 2021-04-12 NOTE — Progress Notes (Signed)
I,Katawbba Wiggins,acting as a Education administrator for Maximino Greenland, MD.,have documented all relevant documentation on the behalf of Maximino Greenland, MD,as directed by  Maximino Greenland, MD while in the presence of Maximino Greenland, MD.  This visit occurred during the SARS-CoV-2 public health emergency.  Safety protocols were in place, including screening questions prior to the visit, additional usage of staff PPE, and extensive cleaning of exam room while observing appropriate contact time as indicated for disinfecting solutions.  Subjective:     Patient ID: [name] , female    DOB: [birth year] , 80 y.o.   MRN: [id number]   Chief Complaint  Patient presents with   Diabetes   Hypertension     HPI  The patient is here for a diabetes and blood pressure follow-up.  She reports compliance with meds. Denies headaches, chest pain and shortness of breath. She is accompanied by her husband today.   Diabetes She presents for her follow-up diabetic visit. She has type 2 diabetes mellitus. There are no hypoglycemic associated symptoms. Pertinent negatives for diabetes include no blurred vision and no chest pain. There are no hypoglycemic complications. Risk factors for coronary artery disease include diabetes mellitus, dyslipidemia, hypertension, obesity, sedentary lifestyle and post-menopausal. She is compliant with treatment most of the time. She participates in exercise intermittently. An ACE inhibitor/angiotensin II receptor blocker is being taken. Eye exam is current.  Hypertension This is a chronic problem. The current episode started more than 1 year ago. The problem has been gradually improving since onset. The problem is controlled. Pertinent negatives include no blurred vision or chest pain. Risk factors for coronary artery disease include diabetes mellitus, dyslipidemia, obesity, post-menopausal state and sedentary lifestyle.    Past Medical History:  Diagnosis Date   Abdominal pain    Arthritis     cervical disc degeneration/ oa left knee, carpal tunnel rt wrist, adhesive capsulitis right shoulder, rt hand weakness; lumbar degeneration   Carpal tunnel syndrome    Complication of anesthesia 2006-at [facility]   breathing problems-no BP med given prior to surgery;  hx of being very sleepy after colon surgery --  states no problems with last right total knee replacement 2013   Constipation    Diabetes mellitus without complication (HCC)    borderline - diet control   Diverticulitis hx of   Diverticulosis    E. coli infection    2020   Frequent UTI    hx of urethral injury during colon surgery - states frequent uti's since   GERD (gastroesophageal reflux disease)    H/O hiatal hernia    History of palpitations    in the past   History of shingles    has a lingering itching on back where shingles were   Hyperlipidemia    Hypertension    Numbness and tingling in right hand    pt. states has numbness of right hand very frequently-watch positioning   Obesity    Osteoarthritis    Osteoporosis    Pain    pain left knee and pain right hip and right groin   Personal history of colonic polyps-adenoma 08/26/2008   Pneumonia 2005   Vitamin D deficiency      Family History  Problem Relation Age of Onset   Breast cancer Mother 37   Hypertension Mother    Prostate cancer Father    Hypertension Father    Dementia Sister    Lung cancer Brother    Hypertension Brother  COPD Brother    Cancer Maternal Grandmother    Arthritis Sister    Hypertension Sister    Arthritis Sister    Hypertension Child    Hypertension Child    Hypertension Child    Colon polyps Neg Hx    Esophageal cancer Neg Hx    Rectal cancer Neg Hx    Stomach cancer Neg Hx    Colon cancer Neg Hx      Current Outpatient Medications:    allopurinol (ZYLOPRIM) 300 MG tablet, TAKE 1 TABLET(300 MG) BY MOUTH DAILY, Disp: 90 tablet, Rfl: 0   aspirin EC 81 MG tablet, Take 1 tablet (81 mg total) by mouth daily.,  Disp: 90 tablet, Rfl: 0   atenolol-chlorthalidone (TENORETIC) 50-25 MG tablet, TAKE 1 TABLET BY MOUTH DAILY, Disp: 90 tablet, Rfl: 0   benazepril (LOTENSIN) 10 MG tablet, Take 10 mg by mouth daily., Disp: , Rfl:    Cyanocobalamin (VITAMIN B12 PO), Take by mouth., Disp: , Rfl:    Dulaglutide (TRULICITY) 7.40 CX/4.4YJ SOPN, INJECT 0.5 ML(0.75 MG) SUBCUTANEOUSLY EVERY WEEK IN ABDOMEN, THIGH OR UPPER ARM ROTATING INJECTION SITE, Disp: 2 mL, Rfl: 0   hydrOXYzine (ATARAX/VISTARIL) 10 MG tablet, Take 1 tablet (10 mg total) by mouth 3 (three) times daily as needed., Disp: 30 tablet, Rfl: 0   Lancets (ONETOUCH DELICA PLUS [id number]) MISC, USE TO CHECK BLOOD SUGAR TWICE DAILY, Disp: 100 each, Rfl: 3   levocetirizine (XYZAL) 5 MG tablet, Take 1 tablet (5 mg total) by mouth every evening., Disp: 30 tablet, Rfl: 5   Magnesium 250 MG TABS, Take 1 tablet (250 mg total) by mouth every evening. Take with dinner meal, Disp: 90 tablet, Rfl: 1   Multiple Vitamins-Minerals (ZINC PO), Take 1 tablet by mouth daily., Disp: , Rfl:    ONETOUCH VERIO test strip, USE TO TEST 2 TIMES DAILY, Disp: 100 strip, Rfl: 3   Probiotic Product (PROBIOTIC PO), Take 1 capsule by mouth daily. , Disp: , Rfl:    rosuvastatin (CRESTOR) 20 MG tablet, TAKE 1 TABLET(20 MG) BY MOUTH DAILY, Disp: 90 tablet, Rfl: 0   Vitamin D, Ergocalciferol, (DRISDOL) 1.25 MG (50000 UNIT) CAPS capsule, TAKE 1 CAPSULE BY MOUTH TWICE WEEKLY ON TUESDAYS AND FRIDAYS, Disp: 26 capsule, Rfl: 0   Allergies  Allergen Reactions   Atorvastatin Calcium Itching   Bee Venom Hives   Oxycodone Other (See Comments)    hallucinations   Vicodin [Hydrocodone-Acetaminophen] Other (See Comments)    hallucinations     Review of Systems  Constitutional: Negative.   Eyes:  Negative for blurred vision.  Respiratory: Negative.    Cardiovascular: Negative.  Negative for chest pain.  Gastrointestinal:  Positive for abdominal pain and diarrhea.       She c/o abd. Bloating,  diarrhea. She came to get samples of FD-Gard earlier in the week and states this helped w/ her sx. She had been taking Pepto-Bismol which was not effective. She adds that she has had 1-2 "black, dark" stools.   Neurological: Negative.   Psychiatric/Behavioral: Negative.      Today's Vitals   04/12/21 1040 04/12/21 1058  BP: (!) 160/82 (!) 150/78  Pulse: 62   Temp: 98.5 F (36.9 C)   TempSrc: Oral   Weight: 235 lb 3.2 oz (106.7 kg)   Height: 5' 6.5" (1.689 m)   PainSc: 0-No pain    Body mass index is 37.39 kg/m.  Wt Readings from Last 3 Encounters:  04/12/21 235 lb 3.2 oz (106.7  kg)  02/24/21 236 lb 6.4 oz (107.2 kg)  02/23/21 237 lb 3.2 oz (107.6 kg)    BP Readings from Last 3 Encounters:  04/12/21 (!) 150/78  02/24/21 (!) 160/70  02/23/21 134/76    Objective:  Physical Exam Vitals and nursing note reviewed.  Constitutional:      Appearance: Normal appearance. She is obese.  HENT:     Head: Normocephalic and atraumatic.     Nose:     Comments: Masked     Mouth/Throat:     Comments: Masked Cardiovascular:     Rate and Rhythm: Normal rate and regular rhythm.     Heart sounds: Normal heart sounds.  Pulmonary:     Effort: Pulmonary effort is normal.     Breath sounds: Normal breath sounds.  Abdominal:     General: Bowel sounds are normal.  Musculoskeletal:     Cervical back: Normal range of motion.  Skin:    General: Skin is warm.  Neurological:     General: No focal deficit present.     Mental Status: She is alert.  Psychiatric:        Mood and Affect: Mood normal.        Behavior: Behavior normal.        Assessment And Plan:     1. Type 2 diabetes mellitus with stage 3a chronic kidney disease, without long-term current use of insulin (HCC) Comments: Chronic, I will check labs as listed below.  She will c/w Trulicity for now.  - Hemoglobin A1c - BMP8+EGFR  2. Hypertensive nephropathy Comments: Uncontrolled. Has yet to take meds today.  Importance of  medication compliance was d/w pt.   3. Abdominal bloating with cramps Comments: She will c/w FD-Gard twice daily as needed. She is reminded that she can get this OTC or w/ [employer].   4. Black stools Comments: I think her sx are due to Pepto-bismol. I will check CBC today.  - CBC no Diff  5. Class 2 severe obesity due to excess calories with serious comorbidity and body mass index (BMI) of 37.0 to 37.9 in adult [facility])  She is encouraged to strive for BMI less than 30 to decrease cardiac risk. Advised to aim for at least 150 minutes of exercise per week.   Patient was given opportunity to ask questions. Patient verbalized understanding of the plan and was able to repeat key elements of the plan. All questions were answered to their satisfaction.   I, Maximino Greenland, MD, have reviewed all documentation for this visit. The documentation on 04/12/21 for the exam, diagnosis, procedures, and orders are all accurate and complete.   IF YOU HAVE BEEN REFERRED TO A SPECIALIST, IT MAY TAKE 1-2 WEEKS TO SCHEDULE/PROCESS THE REFERRAL. IF YOU HAVE NOT HEARD FROM US/SPECIALIST IN TWO WEEKS, PLEASE GIVE [place] A CALL AT [phone].   THE PATIENT IS ENCOURAGED TO PRACTICE SOCIAL DISTANCING DUE TO THE COVID-19 PANDEMIC.

## 2021-04-14 ENCOUNTER — Telehealth: Payer: Medicare Other

## 2021-04-14 ENCOUNTER — Telehealth: Payer: Self-pay

## 2021-04-14 NOTE — Telephone Encounter (Signed)
  Care Management   Follow Up Note   04/14/2021 Name: [name] MRN: [id number] DOB: 10-Sep-1941   Referred by: [name], MD Reason for referral : Chronic Care Management (RNCM follow up call attempt - 1st attempt )   An unsuccessful telephone outreach was attempted today. The patient was referred to the case management team for assistance with care management and care coordination.   Follow Up Plan: A HIPPA compliant phone message was left for the patient providing contact information and requesting a return call.   Barb Merino, RN, BSN, CCM Care Management Coordinator [place] [facility]  Direct Phone: [phone]

## 2021-04-21 DIAGNOSIS — R350 Frequency of micturition: Secondary | ICD-10-CM | POA: Diagnosis not present

## 2021-04-21 DIAGNOSIS — N281 Cyst of kidney, acquired: Secondary | ICD-10-CM | POA: Diagnosis not present

## 2021-04-21 DIAGNOSIS — N3281 Overactive bladder: Secondary | ICD-10-CM | POA: Diagnosis not present

## 2021-04-27 ENCOUNTER — Ambulatory Visit (INDEPENDENT_AMBULATORY_CARE_PROVIDER_SITE_OTHER): Payer: Medicare Other

## 2021-04-27 DIAGNOSIS — I10 Essential (primary) hypertension: Secondary | ICD-10-CM

## 2021-04-27 DIAGNOSIS — E1122 Type 2 diabetes mellitus with diabetic chronic kidney disease: Secondary | ICD-10-CM

## 2021-04-27 DIAGNOSIS — N1831 Chronic kidney disease, stage 3a: Secondary | ICD-10-CM

## 2021-04-27 NOTE — Chronic Care Management (AMB) (Signed)
Chronic Care Management    Social Work Note  04/27/2021 Name: [name] MRN: [id number] DOB: 05-23-1941  [name] is a 80 y.o. year old female who is a primary care patient of [name], MD. The CCM team was consulted to assist the patient with chronic disease management and/or care coordination needs related to:  DM II, HTN, and CKD III .   Engaged with patient by telephone for follow up visit in response to provider referral for social work chronic care management and care coordination services.   Consent to Services:  The patient was given information about Chronic Care Management services, agreed to services, and gave verbal consent prior to initiation of services.  Please see initial visit note for detailed documentation.   Patient agreed to services and consent obtained.   Assessment: Review of patient past medical history, allergies, medications, and health status, including review of relevant consultants reports was performed today as part of a comprehensive evaluation and provision of chronic care management and care coordination services.     SDOH (Social Determinants of Health) assessments and interventions performed:    Advanced Directives Status: Not addressed in this encounter.  CCM Care Plan  Allergies  Allergen Reactions   Atorvastatin Calcium Itching   Bee Venom Hives   Oxycodone Other (See Comments)    hallucinations   Vicodin [Hydrocodone-Acetaminophen] Other (See Comments)    hallucinations    Outpatient Encounter Medications as of 04/27/2021  Medication Sig Note   allopurinol (ZYLOPRIM) 300 MG tablet TAKE 1 TABLET(300 MG) BY MOUTH DAILY    aspirin EC 81 MG tablet Take 1 tablet (81 mg total) by mouth daily.    atenolol-chlorthalidone (TENORETIC) 50-25 MG tablet TAKE 1 TABLET BY MOUTH DAILY    benazepril (LOTENSIN) 10 MG tablet Take 10 mg by mouth daily.    Cyanocobalamin (VITAMIN B12 PO) Take by mouth.    Dulaglutide (TRULICITY) 6.59 DJ/5.7SV  SOPN INJECT 0.5 ML(0.75 MG) SUBCUTANEOUSLY EVERY WEEK IN ABDOMEN, THIGH OR UPPER ARM ROTATING INJECTION SITE 11/04/2020: Per endocrinologist patient to take trulicity every other week.    hydrOXYzine (ATARAX/VISTARIL) 10 MG tablet Take 1 tablet (10 mg total) by mouth 3 (three) times daily as needed.    Lancets (ONETOUCH DELICA PLUS [id number]) MISC USE TO CHECK BLOOD SUGAR TWICE DAILY    levocetirizine (XYZAL) 5 MG tablet Take 1 tablet (5 mg total) by mouth every evening.    Magnesium 250 MG TABS Take 1 tablet (250 mg total) by mouth every evening. Take with dinner meal    Multiple Vitamins-Minerals (ZINC PO) Take 1 tablet by mouth daily.    ONETOUCH VERIO test strip USE TO TEST 2 TIMES DAILY    Probiotic Product (PROBIOTIC PO) Take 1 capsule by mouth daily.     rosuvastatin (CRESTOR) 20 MG tablet TAKE 1 TABLET(20 MG) BY MOUTH DAILY    Vitamin D, Ergocalciferol, (DRISDOL) 1.25 MG (50000 UNIT) CAPS capsule TAKE 1 CAPSULE BY MOUTH TWICE WEEKLY ON TUESDAYS AND FRIDAYS    No facility-administered encounter medications on file as of 04/27/2021.    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   Aortic atherosclerosis ([place]) 01/03/2021   Class 2 severe obesity due to excess calories with serious comorbidity and body mass index (BMI) of 39.0 to 39.9 in adult [facility]) 01/03/2021   Pruritus 01/03/2021   Vitamin D deficiency 06/01/2020   CAP (community acquired pneumonia) 05/06/2020   Abdominal bloating 02/17/2019   Urinary frequency  02/17/2019   Uncontrolled type 2 diabetes mellitus with hyperglycemia ([place]) 02/17/2019   Lightheaded 02/17/2019   Constipation 06/26/2018   Hyperlipidemia 08/23/2014   Type 2 diabetes mellitus with stage 3 chronic kidney disease, without long-term current use of insulin ([place]) 06/19/2014   Mixed hyperlipidemia 06/19/2014   S/P urological surgery 09/30/2013   Joint pain 09/30/2013   UTI (urinary tract infection) 09/30/2013   Cough  12/10/2012   OA (osteoarthritis) of knee 01/05/2012   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 08/02/2011   Vaginal discharge 08/02/2011   Spasm of lumbar paraspinous muscle 06/17/2011   Morbidly obese ([place]) 05/31/2011   HIP PAIN, LEFT, CHRONIC 11/09/2010   CARPAL TUNNEL SYNDROME, RIGHT 10/14/2010   KNEE PAIN, RIGHT, CHRONIC 10/14/2010   LEFT BUNDLE BRANCH BLOCK 74/82/7078   HELICOBACTER PYLORI GASTRITIS 07/18/2010   FATIGUE 07/01/2010   CHEST PAIN, ATYPICAL 07/01/2010   EPIGASTRIC PAIN 07/01/2010   SUPRAPUBIC PAIN 07/01/2010   HYPERGLYCEMIA 07/01/2010   CELLULITIS AND ABSCESS OF OTHER SPECIFIED SITE 02/15/2010   LUMP OR MASS IN BREAST 01/10/2010   DYSURIA, CHRONIC 03/09/2009   GERD 10/06/2008   Personal history of colonic polyps-adenoma 08/26/2008   LOC OSTEOARTHROS NOT SPEC PRIM/SEC LOWER LEG 09/10/2007   GOUT, UNSPECIFIED 09/09/2007   DIVERTICULOSIS, COLON 09/09/2007   DIVERTICULITIS, HX OF 09/09/2007   HYPERLIPIDEMIA 06/03/2007   Essential hypertension 06/03/2007    Conditions to be addressed/monitored: HTN, DMII, and CKD Stage III  Care Plan : Social Work Care Plan  Updates made by [name] since 04/27/2021 12:00 AM     Problem: Barriers to Treatment      Goal: Obtain MRI Completed 04/27/2021  Start Date: 03/02/2021  Expected End Date: 04/01/2021  Recent Progress: On track  Priority: High  Note:   Current Barriers:  Chronic disease management support and education needs related to HTN, DM, and CKD Stage III   Lacks knowledge of how to schedule MRI  Social Worker Clinical Goal(s):  Over the next 30 days the patient will work with primary care team to schedule an MRI Goal Met Over the next 30 days the patient will follow up with her primary care provider to discuss MRI results as directed by SW  SW Interventions:  Inter-disciplinary care team collaboration (see longitudinal plan of care) Collaboration with [name], MD regarding development and update of  comprehensive plan of care as evidenced by provider attestation and co-signature Successful outbound call placed to the patient to assess goal progression Determined patient did attend OV with Dr. Baird Cancer on 6.14.22 Assessed for care coordination needs- patient denies at this time stated she is doing well Discussed plan for SW to contact patient over the next 90 days  Patient Goals/Self-Care Activities patient will:  Contact SW as needed prior to next scheduled call        Follow Up Plan: SW will follow up with patient by phone over the next 90 days      [name], BSW, CDP Social Worker, Certified Dementia Practitioner [place] [facility] [place] [facility] [phone]

## 2021-04-27 NOTE — Patient Instructions (Signed)
Social Worker Visit Information  Goals we discussed today:   Goals Addressed             This Visit's Progress    COMPLETED: Obtain MRI       Timeframe:  Short-Term Goal Priority:  High Start Date:    5.4.22                         Expected End Date:   6.3.22                    Goal Met 6.29.22  Patient Goals/Self-Care Activities patient will:  Contact SW as needed prior to next scheduled call          Follow Up Plan: SW will follow up with patient by phone over the next 90 days   [name], BSW, CDP Social Worker, Certified Dementia Practitioner [place] [facility] [place] [facility] [phone]

## 2021-05-03 ENCOUNTER — Telehealth: Payer: Self-pay

## 2021-05-03 NOTE — Chronic Care Management (AMB) (Signed)
Chronic Care Management Pharmacy Assistant   Name: Kadra Kohan  MRN: [id number] DOB: [birth year]   Reason for Encounter: Disease State/ Hypertension   Recent office visits:  04-27-21 [name] (CCM)  Recent consult visits:  none  Hospital visits:  None in previous 6 months  Medications: Outpatient Encounter Medications as of 05/03/2021  Medication Sig Note   allopurinol (ZYLOPRIM) 300 MG tablet TAKE 1 TABLET(300 MG) BY MOUTH DAILY    aspirin EC 81 MG tablet Take 1 tablet (81 mg total) by mouth daily.    atenolol-chlorthalidone (TENORETIC) 50-25 MG tablet TAKE 1 TABLET BY MOUTH DAILY    benazepril (LOTENSIN) 10 MG tablet Take 10 mg by mouth daily.    Cyanocobalamin (VITAMIN B12 PO) Take by mouth.    Dulaglutide (TRULICITY) 3.79 KW/4.0XB SOPN INJECT 0.5 ML(0.75 MG) SUBCUTANEOUSLY EVERY WEEK IN ABDOMEN, THIGH OR UPPER ARM ROTATING INJECTION SITE 11/04/2020: Per endocrinologist patient to take trulicity every other week.    hydrOXYzine (ATARAX/VISTARIL) 10 MG tablet Take 1 tablet (10 mg total) by mouth 3 (three) times daily as needed.    Lancets (ONETOUCH DELICA PLUS [id number]) MISC USE TO CHECK BLOOD SUGAR TWICE DAILY    levocetirizine (XYZAL) 5 MG tablet Take 1 tablet (5 mg total) by mouth every evening.    Magnesium 250 MG TABS Take 1 tablet (250 mg total) by mouth every evening. Take with dinner meal    Multiple Vitamins-Minerals (ZINC PO) Take 1 tablet by mouth daily.    ONETOUCH VERIO test strip USE TO TEST 2 TIMES DAILY    Probiotic Product (PROBIOTIC PO) Take 1 capsule by mouth daily.     rosuvastatin (CRESTOR) 20 MG tablet TAKE 1 TABLET(20 MG) BY MOUTH DAILY    Vitamin D, Ergocalciferol, (DRISDOL) 1.25 MG (50000 UNIT) CAPS capsule TAKE 1 CAPSULE BY MOUTH TWICE WEEKLY ON TUESDAYS AND FRIDAYS    No facility-administered encounter medications on file as of 05/03/2021.   Reviewed chart prior to disease state call. Spoke with patient regarding BP  Recent Office Vitals: BP  Readings from Last 3 Encounters:  04/12/21 (!) 150/78  02/24/21 (!) 160/70  02/23/21 134/76   Pulse Readings from Last 3 Encounters:  04/12/21 62  02/24/21 62  02/23/21 (!) 55    Wt Readings from Last 3 Encounters:  04/12/21 235 lb 3.2 oz (106.7 kg)  02/24/21 236 lb 6.4 oz (107.2 kg)  02/23/21 237 lb 3.2 oz (107.6 kg)     Kidney Function Lab Results  Component Value Date/Time   CREATININE 1.11 (H) 04/12/2021 01:01 PM   CREATININE 0.98 02/18/2021 03:09 PM   GFR 53.18 (L) 06/19/2014 03:42 PM   GFRNONAA 59 (L) 02/18/2021 03:09 PM   GFRAA 54 (L) 06/01/2020 04:09 PM    BMP Latest Ref Rng & Units 04/12/2021 02/18/2021 12/30/2020  Glucose 65 - 99 mg/dL 92 157(H) 98  BUN 8 - 27 mg/dL 23 23 15   Creatinine 0.57 - 1.00 mg/dL 1.11(H) 0.98 1.10(H)  BUN/Creat Ratio 12 - 28 21 - 14  Sodium 134 - 144 mmol/L 138 140 144  Potassium 3.5 - 5.2 mmol/L 4.4 3.5 4.5  Chloride 96 - 106 mmol/L 98 103 102  CO2 20 - 29 mmol/L 24 27 24   Calcium 8.7 - 10.3 mg/dL 9.7 9.3 9.9    Current antihypertensive regimen:  Benazepril 10 mg daily Atenolol-chlorthalidone 50-25 mg daily  How often are you checking your Blood Pressure? infrequently Current home BP readings: last taken in the Office. She stated  her last office visit was with Nephrology and was 131/78. What recent interventions/DTPs have been made by any provider to improve Blood Pressure control since last CPP Visit:  Daily salt intake goal < 2300 mg; Exercise goal of 150 minutes per week; Importance of home blood pressure monitoring; Proper BP monitoring technique Counseled to monitor BP at home at least three times per week, document, and provide log at future appointments Recommended to continue current medication  Any recent hospitalizations or ED visits since last visit with CPP? No What diet changes have been made to improve Blood Pressure Control?  Patient is still adhering to the low sodium diet previously discussed. What exercise is  being done to improve your Blood Pressure Control?  Patient is still working at her business and is still cleaning around the house when possible.  Adherence Review: Is the patient currently on ACE/ARB medication? Yes Does the patient have >5 day gap between last estimated fill dates? No   Star Rating Drugs: Rosuvastatin 20 mg- Last filled 03-06-2021 90 DS [facility] Trulicity 1.90 mg- Patient Assistance Benazepril 10 mg- Last filled 03-16-2021 90 DS [place] Clinical Pharmacist Assistant [phone]

## 2021-05-07 DIAGNOSIS — Z03818 Encounter for observation for suspected exposure to other biological agents ruled out: Secondary | ICD-10-CM | POA: Diagnosis not present

## 2021-05-18 ENCOUNTER — Telehealth: Payer: Medicare Other

## 2021-05-18 ENCOUNTER — Ambulatory Visit (INDEPENDENT_AMBULATORY_CARE_PROVIDER_SITE_OTHER): Payer: Medicare Other

## 2021-05-18 DIAGNOSIS — N183 Chronic kidney disease, stage 3 unspecified: Secondary | ICD-10-CM | POA: Diagnosis not present

## 2021-05-18 DIAGNOSIS — E1122 Type 2 diabetes mellitus with diabetic chronic kidney disease: Secondary | ICD-10-CM

## 2021-05-18 DIAGNOSIS — N1831 Chronic kidney disease, stage 3a: Secondary | ICD-10-CM | POA: Diagnosis not present

## 2021-05-18 DIAGNOSIS — I10 Essential (primary) hypertension: Secondary | ICD-10-CM | POA: Diagnosis not present

## 2021-05-19 ENCOUNTER — Ambulatory Visit (INDEPENDENT_AMBULATORY_CARE_PROVIDER_SITE_OTHER): Payer: Medicare Other

## 2021-05-19 VITALS — Ht 64.5 in | Wt 235.0 lb

## 2021-05-19 DIAGNOSIS — Z0000 Encounter for general adult medical examination without abnormal findings: Secondary | ICD-10-CM

## 2021-05-19 NOTE — Progress Notes (Signed)
I connected with [name] today by telephone and verified that I am speaking with the correct person using two identifiers. Location patient: home Location provider: work Persons participating in the virtual visit: [name], [name] LPN.   I discussed the limitations, risks, security and privacy concerns of performing an evaluation and management service by telephone and the availability of in person appointments. I also discussed with the patient that there may be a patient responsible charge related to this service. The patient expressed understanding and verbally consented to this telephonic visit.    Interactive audio and video telecommunications were attempted between this provider and patient, however failed, due to patient having technical difficulties OR patient did not have access to video capability.  We continued and completed visit with audio only.     Vital signs may be patient reported or missing.  Subjective:   [name] is a 80 y.o. female who presents for Medicare Annual (Subsequent) preventive examination.  Review of Systems     Cardiac Risk Factors include: advanced age (>59men, >9 women);diabetes mellitus;hypertension;obesity (BMI >30kg/m2);sedentary lifestyle     Objective:    Today's Vitals   05/19/21 1112  Weight: 235 lb (106.6 kg)  Height: 5' 4.5" (1.638 m)   Body mass index is 39.71 kg/m.  Advanced Directives 05/19/2021 02/18/2021 08/28/2020 05/06/2020 12/31/2019 10/30/2019 03/19/2019  Does Patient Have a Medical Advance Directive? No No No No No No No  Would patient like information on creating a medical advance directive? - - Yes (ED - Information included in AVS) No - Patient declined Yes (MAU/Ambulatory/Procedural Areas - Information given) No - Patient declined Yes (MAU/Ambulatory/Procedural Areas - Information given)  Pre-existing out of facility DNR order (yellow form or pink MOST form) - - - - - - -    Current Medications  (verified) Outpatient Encounter Medications as of 05/19/2021  Medication Sig   allopurinol (ZYLOPRIM) 300 MG tablet TAKE 1 TABLET(300 MG) BY MOUTH DAILY   aspirin EC 81 MG tablet Take 1 tablet (81 mg total) by mouth daily.   atenolol-chlorthalidone (TENORETIC) 50-25 MG tablet TAKE 1 TABLET BY MOUTH DAILY   benazepril (LOTENSIN) 10 MG tablet Take 10 mg by mouth daily.   Cyanocobalamin (VITAMIN B12 PO) Take by mouth.   Dulaglutide (TRULICITY) 8.78 MV/6.7MC SOPN INJECT 0.5 ML(0.75 MG) SUBCUTANEOUSLY EVERY WEEK IN ABDOMEN, THIGH OR UPPER ARM ROTATING INJECTION SITE   hydrOXYzine (ATARAX/VISTARIL) 10 MG tablet Take 1 tablet (10 mg total) by mouth 3 (three) times daily as needed.   Lancets (ONETOUCH DELICA PLUS [id number]) MISC USE TO CHECK BLOOD SUGAR TWICE DAILY   levocetirizine (XYZAL) 5 MG tablet Take 1 tablet (5 mg total) by mouth every evening.   Magnesium 250 MG TABS Take 1 tablet (250 mg total) by mouth every evening. Take with dinner meal   Multiple Vitamins-Minerals (ZINC PO) Take 1 tablet by mouth daily.   ONETOUCH VERIO test strip USE TO TEST 2 TIMES DAILY   Probiotic Product (PROBIOTIC PO) Take 1 capsule by mouth daily.    rosuvastatin (CRESTOR) 20 MG tablet TAKE 1 TABLET(20 MG) BY MOUTH DAILY   Vitamin D, Ergocalciferol, (DRISDOL) 1.25 MG (50000 UNIT) CAPS capsule TAKE 1 CAPSULE BY MOUTH TWICE WEEKLY ON TUESDAYS AND FRIDAYS   No facility-administered encounter medications on file as of 05/19/2021.    Allergies (verified) Atorvastatin calcium, Bee venom, Oxycodone, and Vicodin [hydrocodone-acetaminophen]   History: Past Medical History:  Diagnosis Date   Abdominal pain    Arthritis  cervical disc degeneration/ oa left knee, carpal tunnel rt wrist, adhesive capsulitis right shoulder, rt hand weakness; lumbar degeneration   Carpal tunnel syndrome    Complication of anesthesia 2006-at [facility]   breathing problems-no BP med given prior to surgery;  hx of being very sleepy after  colon surgery --  states no problems with last right total knee replacement 2013   Constipation    Diabetes mellitus without complication ([place])    borderline - diet control   Diverticulitis hx of   Diverticulosis    E. coli infection    2020   Frequent UTI    hx of urethral injury during colon surgery - states frequent uti's since   GERD (gastroesophageal reflux disease)    H/O hiatal hernia    History of palpitations    in the past   History of shingles    has a lingering itching on back where shingles were   Hyperlipidemia    Hypertension    Numbness and tingling in right hand    pt. states has numbness of right hand very frequently-watch positioning   Obesity    Osteoarthritis    Osteoporosis    Pain    pain left knee and pain right hip and right groin   Personal history of colonic polyps-adenoma 08/26/2008   Pneumonia 2005   Vitamin D deficiency    Past Surgical History:  Procedure Laterality Date   ABDOMINAL HYSTERECTOMY     bladder tack     BREAST BIOPSY Left    BREAST SURGERY     breast duct resection- benign   CARPAL TUNNEL RELEASE Right 06/04/2014   Procedure: RIGHT CARPAL TUNNEL RELEASE;  Surgeon: Roseanne Kaufman, MD;  Location: [facility];  Service: Orthopedics;  Laterality: Right;   COLON RESECTION  2008   hx diverticulosis   COLON SURGERY     COLONOSCOPY     colonoscopy [id number]     JOINT REPLACEMENT     OOPHORECTOMY     POLYPECTOMY     skin graft left arm - traumatic compression injury left upper arm     temporary ureter stent     TOTAL KNEE ARTHROPLASTY  01/05/2012   Procedure: TOTAL KNEE ARTHROPLASTY;  Surgeon: [name], MD;  Location: [facility];  Service: Orthopedics;  Laterality: Right;   TOTAL KNEE ARTHROPLASTY Left 08/17/2014   Procedure: LEFT TOTAL KNEE ARTHROPLASTY;  Surgeon: [name], MD;  Location: [facility];  Service: Orthopedics;  Laterality: Left;   ureter repair for tyransected left ureter     Family History   Problem Relation Age of Onset   Breast cancer Mother 63   Hypertension Mother    Prostate cancer Father    Hypertension Father    Dementia Sister    Lung cancer Brother    Hypertension Brother    COPD Brother    Cancer Maternal Grandmother    Arthritis Sister    Hypertension Sister    Arthritis Sister    Hypertension Child    Hypertension Child    Hypertension Child    Colon polyps Neg Hx    Esophageal cancer Neg Hx    Rectal cancer Neg Hx    Stomach cancer Neg Hx    Colon cancer Neg Hx    Social History   Socioeconomic History   Marital status: Married    Spouse name: Not on file   Number of children: 6   Years of education: Not on file  Highest education level: Not on file  Occupational History   Occupation: DESIGNER    Employer: [employer]  Tobacco Use   Smoking status: Never   Smokeless tobacco: Never  Vaping Use   Vaping Use: Never used  Substance and Sexual Activity   Alcohol use: No   Drug use: No   Sexual activity: Not Currently  Other Topics Concern   Not on file  Social History Narrative   The patient is married and has 6 children   Operates a business   No alcohol tobacco or drug use   Social Determinants of Radio broadcast assistant Strain: Low Risk    Difficulty of Paying Living Expenses: Not hard at all  Food Insecurity: No Food Insecurity   Worried About Charity fundraiser in the Last Year: Never true   Arboriculturist in the Last Year: Never true  Transportation Needs: No Transportation Needs   Lack of Transportation (Medical): No   Lack of Transportation (Non-Medical): No  Physical Activity: Inactive   Days of Exercise per Week: 0 days   Minutes of Exercise per Session: 0 min  Stress: No Stress Concern Present   Feeling of Stress : Only a little  Social Connections: Not on file    Tobacco Counseling Counseling given: Not Answered   Clinical Intake:  Pre-visit preparation completed: Yes  Pain : No/denies pain      Nutritional Status: BMI > 30  Obese Nutritional Risks: None Diabetes: Yes  How often do you need to have someone help you when you read instructions, pamphlets, or other written materials from your doctor or pharmacy?: 1 - Never What is the last grade level you completed in school?: college  Diabetic? Yes Nutrition Risk Assessment:  Has the patient had any N/V/D within the last 2 months?  No  Does the patient have any non-healing wounds?  No  Has the patient had any unintentional weight loss or weight gain?  No   Diabetes:  Is the patient diabetic?  Yes  If diabetic, was a CBG obtained today?  No  Did the patient bring in their glucometer from home?  No  How often do you monitor your CBG's? 3 times weekly.   Financial Strains and Diabetes Management:  Are you having any financial strains with the device, your supplies or your medication? No .  Does the patient want to be seen by Chronic Care Management for management of their diabetes?  No  Would the patient like to be referred to a Nutritionist or for Diabetic Management?  No   Diabetic Exams:  Diabetic Eye Exam: Completed 02/08/2021 Diabetic Foot Exam: Completed 06/01/2020   Interpreter Needed?: No  Information entered by :: NAllen LPN   Activities of Daily Living In your present state of health, do you have any difficulty performing the following activities: 05/19/2021  Hearing? N  Vision? Y  Comment not focusing as well  Difficulty concentrating or making decisions? N  Walking or climbing stairs? N  Dressing or bathing? N  Doing errands, shopping? N  Preparing Food and eating ? N  Using the Toilet? N  In the past six months, have you accidently leaked urine? Y  Comment wears pullups  Do you have problems with loss of bowel control? N  Managing your Medications? N  Managing your Finances? N  Housekeeping or managing your Housekeeping? N  Some recent data might be hidden    Patient Care Team: [name],  MD as PCP - General (Internal Medicine) [name] as Social Worker [place], Claudette Stapler, RN as Case Manager [place], Sharyn Blitz, [place] (Pharmacist)  Indicate any recent Medical Services you may have received from other than [facility] providers in the past year (date may be approximate).     Assessment:   This is a routine wellness examination for [place].  Hearing/Vision screen Vision Screening - Comments:: Regular eye exams, Dr. Katy Fitch  Dietary issues and exercise activities discussed: Current Exercise Habits: The patient does not participate in regular exercise at present   Goals Addressed             This Visit's Progress    Patient Stated       05/19/2021, wants to start exercising       Depression Screen PHQ 2/9 Scores 05/19/2021 04/12/2021 03/22/2020 12/31/2019 04/29/2019 03/19/2019 03/19/2019  PHQ - 2 Score 0 0 0 0 0 0 0  PHQ- 9 Score - - - 0 - 0 -    Fall Risk Fall Risk  05/19/2021 04/12/2021 03/22/2020 12/31/2019 06/19/2019  Falls in the past year? 0 0 0 0 0  Number falls in past yr: - 0 0 - -  Injury with Fall? - 0 0 - -  Risk for fall due to : Medication side effect - - Medication side effect -  Follow up Falls evaluation completed;Education provided;Falls prevention discussed - - Falls evaluation completed;Education provided;Falls prevention discussed -    FALL RISK PREVENTION PERTAINING TO THE HOME:  Any stairs in or around the home? No  If so, are there any without handrails?  N/a Home free of loose throw rugs in walkways, pet beds, electrical cords, etc? Yes  Adequate lighting in your home to reduce risk of falls? Yes   ASSISTIVE DEVICES UTILIZED TO PREVENT FALLS:  Life alert? No  Use of a cane, walker or w/c? No  Grab bars in the bathroom? Yes  Shower chair or bench in shower? No  Elevated toilet seat or a handicapped toilet? Yes   TIMED UP AND GO:  Was the test performed? No .      Cognitive Function:     6CIT Screen 05/19/2021 12/31/2019 03/19/2019   What Year? 0 points 0 points 0 points  What month? 0 points 3 points 0 points  What time? 0 points 0 points 0 points  Count back from 20 0 points 2 points 0 points  Months in reverse 0 points 4 points 0 points  Repeat phrase 2 points 2 points 2 points  Total Score 2 11 2     Immunizations Immunization History  Administered Date(s) Administered   Fluad Quad(high Dose 65+) 09/01/2020   Influenza Split 07/25/2012   Influenza, High Dose Seasonal PF 10/08/2018, 10/02/2019   Influenza,inj,Quad PF,6+ Mos 11/21/2013   Influenza-Unspecified 07/25/2012, 11/21/2013   Moderna SARS-COV2 Booster Vaccination 10/08/2020, 10/08/2020   Moderna Sars-Covid-2 Vaccination 12/03/2019, 01/01/2020   Pneumococcal Polysaccharide-23 11/21/2013   Pneumococcal-Unspecified 11/21/2013   Td 01/29/1999, 11/21/2013    TDAP status: Up to date  Flu Vaccine status: Up to date  Pneumococcal vaccine status: Up to date  Covid-19 vaccine status: Completed vaccines  Qualifies for Shingles Vaccine? Yes   Zostavax completed No   Shingrix Completed?: No.    Education has been provided regarding the importance of this vaccine. Patient has been advised to call insurance company to determine out of pocket expense if they have not yet received this vaccine. Advised may also receive vaccine at local pharmacy or  Health Dept. Verbalized acceptance and understanding.  Screening Tests Health Maintenance  Topic Date Due   Zoster Vaccines- Shingrix (1 of 2) Never done   COVID-19 Vaccine (4 - Booster for Moderna series) 01/06/2021   INFLUENZA VACCINE  05/30/2021   FOOT EXAM  06/01/2021   HEMOGLOBIN A1C  10/12/2021   OPHTHALMOLOGY EXAM  02/03/2022   TETANUS/TDAP  11/22/2023   DEXA SCAN  Completed   Hepatitis C Screening  Completed   PNA vac Low Risk Adult  Completed   HPV VACCINES  Aged Out    Health Maintenance  Health Maintenance Due  Topic Date Due   Zoster Vaccines- Shingrix (1 of 2) Never done   COVID-19 Vaccine  (4 - Booster for Moderna series) 01/06/2021    Colorectal cancer screening: No longer required.   Mammogram status: Completed 08/26/2020. Repeat every year  Bone Density status: Completed 10/06/2015.   Lung Cancer Screening: (Low Dose CT Chest recommended if Age 39-80 years, 30 pack-year currently smoking OR have quit w/in 15years.) does not qualify.   Lung Cancer Screening Referral: no  Additional Screening:  Hepatitis C Screening: does qualify; Completed 05/12/2020  Vision Screening: Recommended annual ophthalmology exams for early detection of glaucoma and other disorders of the eye. Is the patient up to date with their annual eye exam?  Yes  Who is the provider or what is the name of the office in which the patient attends annual eye exams? Dr. Katy Fitch If pt is not established with a provider, would they like to be referred to a provider to establish care? No .   Dental Screening: Recommended annual dental exams for proper oral hygiene  Community Resource Referral / Chronic Care Management: CRR required this visit?  No   CCM required this visit?  No      Plan:     I have personally reviewed and noted the following in the patient's chart:   Medical and social history Use of alcohol, tobacco or illicit drugs  Current medications and supplements including opioid prescriptions.  Functional ability and status Nutritional status Physical activity Advanced directives List of other physicians Hospitalizations, surgeries, and ER visits in previous 12 months Vitals Screenings to include cognitive, depression, and falls Referrals and appointments  In addition, I have reviewed and discussed with patient certain preventive protocols, quality metrics, and best practice recommendations. A written personalized care plan for preventive services as well as general preventive health recommendations were provided to patient.     Kellie Simmering, LPN   [id number]   Nurse Notes:

## 2021-05-19 NOTE — Patient Instructions (Signed)
[name] , Thank you for taking time to come for your Medicare Wellness Visit. I appreciate your ongoing commitment to your health goals. Please review the following plan we discussed and let me know if I can assist you in the future.   Screening recommendations/referrals: Colonoscopy: not required Mammogram: completed 08/26/2020 Bone Density: completed 10/06/2015 Recommended yearly ophthalmology/optometry visit for glaucoma screening and checkup Recommended yearly dental visit for hygiene and checkup  Vaccinations: Influenza vaccine: due 05/30/2021 Pneumococcal vaccine: completed 04/06/2015 Tdap vaccine: completed 11/21/2013, due 11/22/2023 Shingles vaccine: discussed   Covid-19: 10/08/2020, 01/01/2020, 12/03/2019  Advanced directives: Advance directive discussed with you today.   Conditions/risks identified: none  Next appointment: Follow up in one year for your annual wellness visit    Preventive Care 65 Years and Older, Female Preventive care refers to lifestyle choices and visits with your health care provider that can promote health and wellness. What does preventive care include? A yearly physical exam. This is also called an annual well check. Dental exams once or twice a year. Routine eye exams. Ask your health care provider how often you should have your eyes checked. Personal lifestyle choices, including: Daily care of your teeth and gums. Regular physical activity. Eating a healthy diet. Avoiding tobacco and drug use. Limiting alcohol use. Practicing safe sex. Taking low-dose aspirin every day. Taking vitamin and mineral supplements as recommended by your health care provider. What happens during an annual well check? The services and screenings done by your health care provider during your annual well check will depend on your age, overall health, lifestyle risk factors, and family history of disease. Counseling  Your health care provider may ask you questions about  your: Alcohol use. Tobacco use. Drug use. Emotional well-being. Home and relationship well-being. Sexual activity. Eating habits. History of falls. Memory and ability to understand (cognition). Work and work Statistician. Reproductive health. Screening  You may have the following tests or measurements: Height, weight, and BMI. Blood pressure. Lipid and cholesterol levels. These may be checked every 5 years, or more frequently if you are over 83 years old. Skin check. Lung cancer screening. You may have this screening every year starting at age 65 if you have a 30-pack-year history of smoking and currently smoke or have quit within the past 15 years. Fecal occult blood test (FOBT) of the stool. You may have this test every year starting at age 67. Flexible sigmoidoscopy or colonoscopy. You may have a sigmoidoscopy every 5 years or a colonoscopy every 10 years starting at age 35. Hepatitis C blood test. Hepatitis B blood test. Sexually transmitted disease (STD) testing. Diabetes screening. This is done by checking your blood sugar (glucose) after you have not eaten for a while (fasting). You may have this done every 1-3 years. Bone density scan. This is done to screen for osteoporosis. You may have this done starting at age 50. Mammogram. This may be done every 1-2 years. Talk to your health care provider about how often you should have regular mammograms. Talk with your health care provider about your test results, treatment options, and if necessary, the need for more tests. Vaccines  Your health care provider may recommend certain vaccines, such as: Influenza vaccine. This is recommended every year. Tetanus, diphtheria, and acellular pertussis (Tdap, Td) vaccine. You may need a Td booster every 10 years. Zoster vaccine. You may need this after age 55. Pneumococcal 13-valent conjugate (PCV13) vaccine. One dose is recommended after age 100. Pneumococcal polysaccharide (PPSV23) vaccine.  One  dose is recommended after age 60. Talk to your health care provider about which screenings and vaccines you need and how often you need them. This information is not intended to replace advice given to you by your health care provider. Make sure you discuss any questions you have with your health care provider. Document Released: 11/12/2015 Document Revised: 07/05/2016 Document Reviewed: 08/17/2015 Elsevier Interactive Patient Education  2017 [place] Prevention in the Home Falls can cause injuries. They can happen to people of all ages. There are many things you can do to make your home safe and to help prevent falls. What can I do on the outside of my home? Regularly fix the edges of walkways and driveways and fix any cracks. Remove anything that might make you trip as you walk through a door, such as a raised step or threshold. Trim any bushes or trees on the path to your home. Use bright outdoor lighting. Clear any walking paths of anything that might make someone trip, such as rocks or tools. Regularly check to see if handrails are loose or broken. Make sure that both sides of any steps have handrails. Any raised decks and porches should have guardrails on the edges. Have any leaves, snow, or ice cleared regularly. Use sand or salt on walking paths during winter. Clean up any spills in your garage right away. This includes oil or grease spills. What can I do in the bathroom? Use night lights. Install grab bars by the toilet and in the tub and shower. Do not use towel bars as grab bars. Use non-skid mats or decals in the tub or shower. If you need to sit down in the shower, use a plastic, non-slip stool. Keep the floor dry. Clean up any water that spills on the floor as soon as it happens. Remove soap buildup in the tub or shower regularly. Attach bath mats securely with double-sided non-slip rug tape. Do not have throw rugs and other things on the floor that can make  you trip. What can I do in the bedroom? Use night lights. Make sure that you have a light by your bed that is easy to reach. Do not use any sheets or blankets that are too big for your bed. They should not hang down onto the floor. Have a firm chair that has side arms. You can use this for support while you get dressed. Do not have throw rugs and other things on the floor that can make you trip. What can I do in the kitchen? Clean up any spills right away. Avoid walking on wet floors. Keep items that you use a lot in easy-to-reach places. If you need to reach something above you, use a strong step stool that has a grab bar. Keep electrical cords out of the way. Do not use floor polish or wax that makes floors slippery. If you must use wax, use non-skid floor wax. Do not have throw rugs and other things on the floor that can make you trip. What can I do with my stairs? Do not leave any items on the stairs. Make sure that there are handrails on both sides of the stairs and use them. Fix handrails that are broken or loose. Make sure that handrails are as long as the stairways. Check any carpeting to make sure that it is firmly attached to the stairs. Fix any carpet that is loose or worn. Avoid having throw rugs at the top or bottom of the stairs.  If you do have throw rugs, attach them to the floor with carpet tape. Make sure that you have a light switch at the top of the stairs and the bottom of the stairs. If you do not have them, ask someone to add them for you. What else can I do to help prevent falls? Wear shoes that: Do not have high heels. Have rubber bottoms. Are comfortable and fit you well. Are closed at the toe. Do not wear sandals. If you use a stepladder: Make sure that it is fully opened. Do not climb a closed stepladder. Make sure that both sides of the stepladder are locked into place. Ask someone to hold it for you, if possible. Clearly mark and make sure that you can  see: Any grab bars or handrails. First and last steps. Where the edge of each step is. Use tools that help you move around (mobility aids) if they are needed. These include: Canes. Walkers. Scooters. Crutches. Turn on the lights when you go into a dark area. Replace any light bulbs as soon as they burn out. Set up your furniture so you have a clear path. Avoid moving your furniture around. If any of your floors are uneven, fix them. If there are any pets around you, be aware of where they are. Review your medicines with your doctor. Some medicines can make you feel dizzy. This can increase your chance of falling. Ask your doctor what other things that you can do to help prevent falls. This information is not intended to replace advice given to you by your health care provider. Make sure you discuss any questions you have with your health care provider. Document Released: 08/12/2009 Document Revised: 03/23/2016 Document Reviewed: 11/20/2014 Elsevier Interactive Patient Education  2017 [employer].

## 2021-05-23 ENCOUNTER — Telehealth: Payer: Medicare Other

## 2021-05-26 DIAGNOSIS — N3281 Overactive bladder: Secondary | ICD-10-CM | POA: Diagnosis not present

## 2021-05-26 DIAGNOSIS — R3915 Urgency of urination: Secondary | ICD-10-CM | POA: Diagnosis not present

## 2021-05-30 ENCOUNTER — Other Ambulatory Visit: Payer: Self-pay | Admitting: Internal Medicine

## 2021-05-30 NOTE — Patient Instructions (Signed)
Goals Addressed            Urinary Incontinence symptoms managed   On track     Timeframe:  Long-Range Goal Priority:  High Start Date:  05/18/21                           Expected End Date:  05/18/22  Next Scheduled follow up date: 07/14/21       Self Care Activities:  Self administers medications as prescribed Attends all scheduled provider appointments Calls pharmacy for medication refills Calls provider office for new concerns or questions Patient Goals: -Urinary Incontinence Symptoms Manged -Obtain urinary incontinence supplies                    Vitamin D deficiency complications prevented or minimized   On track     Timeframe:  Long-Range Goal Priority:  High Start Date:  05/18/21                            Expected End Date:  05/18/22  Next Scheduled follow up: 07/14/21        Self Care Activities:  Self administers medications as prescribed Attends all scheduled provider appointments Calls pharmacy for medication refills Calls provider office for new concerns or questions Patient Goals: - Eat Vitamin D rich foods - get 15 minutes of natural sunlight daily when possible - resume taking Vitamin D as prescribed

## 2021-05-30 NOTE — Chronic Care Management (AMB) (Signed)
Chronic Care Management   CCM RN Visit Note  05/18/2021 Name: [name] MRN: [id number] DOB: 09-Dec-1940  Subjective: [name] is a 80 y.o. year old female who is a primary care patient of [name], MD. The care management team was consulted for assistance with disease management and care coordination needs.    Engaged with patient by telephone for follow up visit in response to provider referral for case management and/or care coordination services.   Consent to Services:  The patient was given information about Chronic Care Management services, agreed to services, and gave verbal consent prior to initiation of services.  Please see initial visit note for detailed documentation.   Patient agreed to services and verbal consent obtained.   Assessment: Review of patient past medical history, allergies, medications, health status, including review of consultants reports, laboratory and other test data, was performed as part of comprehensive evaluation and provision of chronic care management services.   SDOH (Social Determinants of Health) assessments and interventions performed:    CCM Care Plan  Allergies  Allergen Reactions   Atorvastatin Calcium Itching   Bee Venom Hives   Oxycodone Other (See Comments)    hallucinations   Vicodin [Hydrocodone-Acetaminophen] Other (See Comments)    hallucinations    Outpatient Encounter Medications as of 05/18/2021  Medication Sig Note   aspirin EC 81 MG tablet Take 1 tablet (81 mg total) by mouth daily.    atenolol-chlorthalidone (TENORETIC) 50-25 MG tablet TAKE 1 TABLET BY MOUTH DAILY    benazepril (LOTENSIN) 10 MG tablet Take 10 mg by mouth daily.    Cyanocobalamin (VITAMIN B12 PO) Take by mouth.    Dulaglutide (TRULICITY) A999333 0000000 SOPN INJECT 0.5 ML(0.75 MG) SUBCUTANEOUSLY EVERY WEEK IN ABDOMEN, THIGH OR UPPER ARM ROTATING INJECTION SITE 11/04/2020: Per endocrinologist patient to take trulicity every other week.     hydrOXYzine (ATARAX/VISTARIL) 10 MG tablet Take 1 tablet (10 mg total) by mouth 3 (three) times daily as needed.    Lancets (ONETOUCH DELICA PLUS 123XX123) MISC USE TO CHECK BLOOD SUGAR TWICE DAILY    levocetirizine (XYZAL) 5 MG tablet Take 1 tablet (5 mg total) by mouth every evening.    Magnesium 250 MG TABS Take 1 tablet (250 mg total) by mouth every evening. Take with dinner meal    Multiple Vitamins-Minerals (ZINC PO) Take 1 tablet by mouth daily.    ONETOUCH VERIO test strip USE TO TEST 2 TIMES DAILY    Probiotic Product (PROBIOTIC PO) Take 1 capsule by mouth daily.     rosuvastatin (CRESTOR) 20 MG tablet TAKE 1 TABLET(20 MG) BY MOUTH DAILY    Vitamin D, Ergocalciferol, (DRISDOL) 1.25 MG (50000 UNIT) CAPS capsule TAKE 1 CAPSULE BY MOUTH TWICE WEEKLY ON TUESDAYS AND FRIDAYS    [DISCONTINUED] allopurinol (ZYLOPRIM) 300 MG tablet TAKE 1 TABLET(300 MG) BY MOUTH DAILY    No facility-administered encounter medications on file as of 05/18/2021.    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   Aortic atherosclerosis ([place]) 01/03/2021   Class 2 severe obesity due to excess calories with serious comorbidity and body mass index (BMI) of 39.0 to 39.9 in adult [facility]) 01/03/2021   Pruritus 01/03/2021   Vitamin D deficiency 06/01/2020   CAP (community acquired pneumonia) 05/06/2020   Abdominal bloating 02/17/2019   Urinary frequency 02/17/2019   Uncontrolled type 2 diabetes mellitus with hyperglycemia ([place]) 02/17/2019   Lightheaded 02/17/2019   Constipation 06/26/2018   Hyperlipidemia 08/23/2014  Type 2 diabetes mellitus with stage 3 chronic kidney disease, without long-term current use of insulin ([place]) 06/19/2014   Mixed hyperlipidemia 06/19/2014   S/P urological surgery 09/30/2013   Joint pain 09/30/2013   UTI (urinary tract infection) 09/30/2013   Cough 12/10/2012   OA (osteoarthritis) of knee 01/05/2012   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 08/02/2011   Vaginal discharge 08/02/2011   Spasm of lumbar paraspinous muscle 06/17/2011   Morbidly obese ([place]) 05/31/2011   HIP PAIN, LEFT, CHRONIC 11/09/2010   CARPAL TUNNEL SYNDROME, RIGHT 10/14/2010   KNEE PAIN, RIGHT, CHRONIC 10/14/2010   LEFT BUNDLE BRANCH BLOCK A999333   HELICOBACTER PYLORI GASTRITIS 07/18/2010   FATIGUE 07/01/2010   CHEST PAIN, ATYPICAL 07/01/2010   EPIGASTRIC PAIN 07/01/2010   SUPRAPUBIC PAIN 07/01/2010   HYPERGLYCEMIA 07/01/2010   CELLULITIS AND ABSCESS OF OTHER SPECIFIED SITE 02/15/2010   LUMP OR MASS IN BREAST 01/10/2010   DYSURIA, CHRONIC 03/09/2009   GERD 10/06/2008   Personal history of colonic polyps-adenoma 08/26/2008   LOC OSTEOARTHROS NOT SPEC PRIM/SEC LOWER LEG 09/10/2007   GOUT, UNSPECIFIED 09/09/2007   DIVERTICULOSIS, COLON 09/09/2007   DIVERTICULITIS, HX OF 09/09/2007   HYPERLIPIDEMIA 06/03/2007   Essential hypertension 06/03/2007    Conditions to be addressed/monitored: DM, HTN, CKD III  Care Plan : Urinary Incontinence (Adult)  Updates made by Lynne Logan, RN since 05/18/2021 12:00 AM     Problem: Symptom Management (Urinary Incontinence)   Priority: High     Long-Range Goal: Urinary Incontinence Symptoms Manged   Start Date: 05/18/2021  Expected End Date: 05/18/2022  This Visit's Progress: On track  Priority: High  Note:   Current Barriers:  Ineffective Self Health Maintenance in a patient with DM, HTN, CKD III Clinical Goal(s):  Collaboration with [name], MD regarding development and update of comprehensive plan of care as evidenced by provider attestation and co-signature Inter-disciplinary care team collaboration (see longitudinal plan of care) patient will work with care management team to address care coordination and chronic disease management needs related to Disease Management Educational Needs Care Coordination Medication Management and Education Psychosocial Support   Interventions:   05/18/21 completed successful inbound call with patient  Evaluation of current treatment plan related to  Impaired Urinary Elimination , self-management and patient's adherence to plan as established by provider. Collaboration with [name], MD regarding development and update of comprehensive plan of care as evidenced by provider attestation       and co-signature Inter-disciplinary care team collaboration (see longitudinal plan of care) Determined patient continues to experience urinary incontinence and is need of the contact number for her health plan DME supplier  Provided patient with the contact number for [place] through [facility]  Educated on the importance of perineal self care and skin breakdown prevention  Discussed plans with patient for ongoing care management follow up and provided patient with direct contact information for care management team Self Care Activities:  Self administers medications as prescribed Attends all scheduled provider appointments Calls pharmacy for medication refills Calls provider office for new concerns or questions Patient Goals: -Urinary Incontinence Symptoms Manged -Obtain urinary incontinence supplies  Follow Up Plan: Telephone follow up appointment with care management team member scheduled for: 07/14/21     Care Plan : Vitamin D deficiency  Updates made by Lynne Logan, RN since 05/18/2021 12:00 AM     Problem: Vitamin D deficiency   Priority: High     Long-Range Goal: Vitamin D deficiency complications prevented or  minimized   Start Date: 05/18/2021  Expected End Date: 05/18/2022  This Visit's Progress: On track  Priority: High  Note:   Current Barriers:  Ineffective Self Health Maintenance in a patient with DM, HTN, CKD III Clinical Goal(s):  Collaboration with [name], MD regarding development and update of comprehensive plan of care as evidenced by provider attestation and co-signature Inter-disciplinary  care team collaboration (see longitudinal plan of care) patient will work with care management team to address care coordination and chronic disease management needs related to Disease Management Educational Needs Care Coordination Medication Management and Education Psychosocial Support   Interventions:  05/18/21 completed successful inbound call with patient  Evaluation of current treatment plan related to  Vitamin D deficiency , self-management and patient's adherence to plan as established by provider. Collaboration with [name], MD regarding development and update of comprehensive plan of care as evidenced by provider attestation       and co-signature Inter-disciplinary care team collaboration (see longitudinal plan of care) Provided education to patient about basic disease process related to Vitamin D deficiency Review of patient status, including review of consultant's reports, relevant laboratory and other test results, and medications completed. Reviewed medications with patient and discussed importance of medication adherence Determined patient stopped her Vitamin D several months ago; Educated on importance of medication adherence Educated on importance of getting 15 minutes of natural sunlight when possible, eat Vitamin D rich foods, resume taking Vitamin D as prescribed Mailed printed educational materials related to Vitamin D deficiency  Discussed plans with patient for ongoing care management follow up and provided patient with direct contact information for care management team Self Care Activities:  Self administers medications as prescribed Attends all scheduled provider appointments Calls pharmacy for medication refills Calls provider office for new concerns or questions Patient Goals: - Eat Vitamin D rich foods - get 15 minutes of natural sunlight daily when possible - resume taking Vitamin D as prescribed   Follow Up Plan: Telephone follow up appointment with  care management team member scheduled for: 07/14/21     Plan:Telephone follow up appointment with care management team member scheduled for:  07/14/21  Barb Merino, RN, BSN, CCM Care Management Coordinator [facility]  Direct Phone: [phone]

## 2021-06-03 DIAGNOSIS — Z20822 Contact with and (suspected) exposure to covid-19: Secondary | ICD-10-CM | POA: Diagnosis not present

## 2021-06-07 ENCOUNTER — Encounter: Payer: Medicare Other | Admitting: Internal Medicine

## 2021-06-20 ENCOUNTER — Other Ambulatory Visit: Payer: Self-pay | Admitting: Internal Medicine

## 2021-06-27 ENCOUNTER — Ambulatory Visit: Payer: Medicare Other

## 2021-06-27 DIAGNOSIS — N183 Chronic kidney disease, stage 3 unspecified: Secondary | ICD-10-CM

## 2021-06-27 DIAGNOSIS — E1122 Type 2 diabetes mellitus with diabetic chronic kidney disease: Secondary | ICD-10-CM

## 2021-06-27 DIAGNOSIS — N1831 Chronic kidney disease, stage 3a: Secondary | ICD-10-CM

## 2021-06-27 DIAGNOSIS — I10 Essential (primary) hypertension: Secondary | ICD-10-CM

## 2021-06-27 NOTE — Chronic Care Management (AMB) (Signed)
  Care Management   Follow Up Note   06/27/2021 Name: [name] MRN: [id number] DOB: 11/17/1940   Referred by: [name], MD Reason for referral : Chronic Care Management   Sw placed a successful outbound call to the patient to assist with care coordination needs. The patient requested SW call back in one hour as she was with a customer. SW attempted a second call back but was unsuccessful. SW left a HIPAA compliant voice message requesting a return call.  Follow Up Plan: The care management team will reach out to the patient again over the next 14 days.   [name], BSW, CDP Social Worker, Certified Dementia Practitioner [facility] [phone]

## 2021-06-30 ENCOUNTER — Ambulatory Visit (INDEPENDENT_AMBULATORY_CARE_PROVIDER_SITE_OTHER): Payer: Medicare Other

## 2021-06-30 DIAGNOSIS — I10 Essential (primary) hypertension: Secondary | ICD-10-CM

## 2021-06-30 DIAGNOSIS — E1122 Type 2 diabetes mellitus with diabetic chronic kidney disease: Secondary | ICD-10-CM

## 2021-06-30 NOTE — Chronic Care Management (AMB) (Signed)
Chronic Care Management    Social Work Note  06/30/2021 Name: [name] MRN: [id number] DOB: 05/09/1941  [name] is a 80 y.o. year old female who is a primary care patient of [name], MD. The CCM team was consulted to assist the patient with chronic disease management and/or care coordination needs related to:  DM II, HTN, and CKD III .   Engaged with patient by telephone for follow up visit in response to provider referral for social work chronic care management and care coordination services.   Consent to Services:  The patient was given information about Chronic Care Management services, agreed to services, and gave verbal consent prior to initiation of services.  Please see initial visit note for detailed documentation.   Patient agreed to services and consent obtained.   Assessment: Review of patient past medical history, allergies, medications, and health status, including review of relevant consultants reports was performed today as part of a comprehensive evaluation and provision of chronic care management and care coordination services.     SDOH (Social Determinants of Health) assessments and interventions performed:    Advanced Directives Status: Not addressed in this encounter.  CCM Care Plan  Allergies  Allergen Reactions   Atorvastatin Calcium Itching   Bee Venom Hives   Oxycodone Other (See Comments)    hallucinations   Vicodin [Hydrocodone-Acetaminophen] Other (See Comments)    hallucinations    Outpatient Encounter Medications as of 06/30/2021  Medication Sig Note   allopurinol (ZYLOPRIM) 300 MG tablet TAKE 1 TABLET(300 MG) BY MOUTH DAILY    aspirin EC 81 MG tablet Take 1 tablet (81 mg total) by mouth daily.    atenolol-chlorthalidone (TENORETIC) 50-25 MG tablet TAKE 1 TABLET BY MOUTH DAILY    benazepril (LOTENSIN) 10 MG tablet Take 10 mg by mouth daily.    Cyanocobalamin (VITAMIN B12 PO) Take by mouth.    Dulaglutide (TRULICITY) A999333 0000000  SOPN INJECT 0.5 ML(0.75 MG) SUBCUTANEOUSLY EVERY WEEK IN ABDOMEN, THIGH OR UPPER ARM ROTATING INJECTION SITE 11/04/2020: Per endocrinologist patient to take trulicity every other week.    hydrOXYzine (ATARAX/VISTARIL) 10 MG tablet Take 1 tablet (10 mg total) by mouth 3 (three) times daily as needed.    Lancets (ONETOUCH DELICA PLUS 123XX123) MISC USE TO CHECK BLOOD SUGAR TWICE DAILY    levocetirizine (XYZAL) 5 MG tablet Take 1 tablet (5 mg total) by mouth every evening.    Magnesium 250 MG TABS Take 1 tablet (250 mg total) by mouth every evening. Take with dinner meal    Multiple Vitamins-Minerals (ZINC PO) Take 1 tablet by mouth daily.    ONETOUCH VERIO test strip USE TO TEST 2 TIMES DAILY    Probiotic Product (PROBIOTIC PO) Take 1 capsule by mouth daily.     rosuvastatin (CRESTOR) 20 MG tablet TAKE 1 TABLET(20 MG) BY MOUTH DAILY    Vitamin D, Ergocalciferol, (DRISDOL) 1.25 MG (50000 UNIT) CAPS capsule TAKE 1 CAPSULE BY MOUTH TWICE WEEKLY ON TUESDAYS AND FRIDAYS    No facility-administered encounter medications on file as of 06/30/2021.    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   Aortic atherosclerosis ([place]) 01/03/2021   Class 2 severe obesity due to excess calories with serious comorbidity and body mass index (BMI) of 39.0 to 39.9 in adult [facility]) 01/03/2021   Pruritus 01/03/2021   Vitamin D deficiency 06/01/2020   CAP (community acquired pneumonia) 05/06/2020   Abdominal bloating 02/17/2019   Urinary frequency  02/17/2019   Uncontrolled type 2 diabetes mellitus with hyperglycemia ([place]) 02/17/2019   Lightheaded 02/17/2019   Constipation 06/26/2018   Hyperlipidemia 08/23/2014   Type 2 diabetes mellitus with stage 3 chronic kidney disease, without long-term current use of insulin ([place]) 06/19/2014   Mixed hyperlipidemia 06/19/2014   S/P urological surgery 09/30/2013   Joint pain 09/30/2013   UTI (urinary tract infection) 09/30/2013   Cough  12/10/2012   OA (osteoarthritis) of knee 01/05/2012   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 08/02/2011   Vaginal discharge 08/02/2011   Spasm of lumbar paraspinous muscle 06/17/2011   Morbidly obese ([place]) 05/31/2011   HIP PAIN, LEFT, CHRONIC 11/09/2010   CARPAL TUNNEL SYNDROME, RIGHT 10/14/2010   KNEE PAIN, RIGHT, CHRONIC 10/14/2010   LEFT BUNDLE BRANCH BLOCK A999333   HELICOBACTER PYLORI GASTRITIS 07/18/2010   FATIGUE 07/01/2010   CHEST PAIN, ATYPICAL 07/01/2010   EPIGASTRIC PAIN 07/01/2010   SUPRAPUBIC PAIN 07/01/2010   HYPERGLYCEMIA 07/01/2010   CELLULITIS AND ABSCESS OF OTHER SPECIFIED SITE 02/15/2010   LUMP OR MASS IN BREAST 01/10/2010   DYSURIA, CHRONIC 03/09/2009   GERD 10/06/2008   Personal history of colonic polyps-adenoma 08/26/2008   LOC OSTEOARTHROS NOT SPEC PRIM/SEC LOWER LEG 09/10/2007   GOUT, UNSPECIFIED 09/09/2007   DIVERTICULOSIS, COLON 09/09/2007   DIVERTICULITIS, HX OF 09/09/2007   HYPERLIPIDEMIA 06/03/2007   Essential hypertension 06/03/2007    Conditions to be addressed/monitored: HTN, DMII, and CKD Stage III  Care Plan : Social Work Care Plan  Updates made by [name] since 06/30/2021 12:00 AM     Problem: Barriers to Treatment Resolved 06/30/2021     Problem: Quality of Life (General Plan of Care)      Long-Range Goal: Quality of Life Maintained   Start Date: 06/30/2021  Expected End Date: 10/28/2021  Priority: Low  Note:   Current Barriers:  Chronic disease management support and education needs related to HTN, DM, and CKD Stage III    Social Worker Clinical Goal(s):  patient will work with SW to identify and address any acute and/or chronic care coordination needs related to the self health management of HTN, DM, and CKD Stage III    SW Interventions:  Inter-disciplinary care team collaboration (see longitudinal plan of care) Collaboration with [name], MD regarding development and update of comprehensive plan of care as  evidenced by provider attestation and co-signature Inbound call received by the patient to request SW assistance Patient reports she is unsure what certain medications are on an after visit summary from a recent visit and would like SW to assist with determining if they are her medications Patient requests feedback regarding what medication "Ergocalciferol" is SW advised the patient this is her Vitamin D - patient stated understanding and endorses she is taking as prescribed Patient expressed concern due to seeing an allergist recently and she was concerned medications may have been prescribed that were not needed Patient reports new medication of Xyzal which she is not taking as prescribed because she does not feel she has an allergy Advised the patient to contact her primary provider if she has further medication questions Discussed long term follow up with SW while patient remains actively involved with  RN Case Manager  to address care management needs Scheduled follow up call over the next 90 days  Patient Goals/Self-Care Activities patient will:   -  contact her primary provider as needed with medication questions -Contact SW as needed prior to next scheduled call  Follow Up Plan:  SW  will contact the patient over the next 90 days       Follow Up Plan: SW will follow up with patient by phone over the next 90 days      [name], BSW, CDP Social Worker, Certified Dementia Practitioner [place] [facility] [phone]

## 2021-06-30 NOTE — Patient Instructions (Signed)
Social Worker Visit Information  Goals we discussed today:   Goals Addressed             This Visit's Progress    Quality of Life Maintained       Timeframe:  Long-Range Goal Priority:  Low Start Date: 9.1.22                            Expected End Date: 12.30.22                      Next planned outreach: 12.6.22  Patient Goals/Self-Care Activities patient will:   - contact her primary provider as needed with medication questions -Contact SW as needed prior to next scheduled call         Follow Up Plan: SW will follow up with patient by phone over the next 90 days   [name], BSW, CDP Social Worker, Certified Dementia Practitioner [place] [facility] [facility] [phone]

## 2021-07-05 ENCOUNTER — Telehealth: Payer: Medicare Other

## 2021-07-06 DIAGNOSIS — Z20822 Contact with and (suspected) exposure to covid-19: Secondary | ICD-10-CM | POA: Diagnosis not present

## 2021-07-12 ENCOUNTER — Encounter: Payer: Self-pay | Admitting: Internal Medicine

## 2021-07-12 ENCOUNTER — Other Ambulatory Visit: Payer: Self-pay

## 2021-07-12 ENCOUNTER — Ambulatory Visit (INDEPENDENT_AMBULATORY_CARE_PROVIDER_SITE_OTHER): Payer: Medicare Other | Admitting: Internal Medicine

## 2021-07-12 VITALS — BP 132/70 | HR 59 | Temp 97.7°F | Ht 66.8 in | Wt 236.2 lb

## 2021-07-12 DIAGNOSIS — Z0000 Encounter for general adult medical examination without abnormal findings: Secondary | ICD-10-CM

## 2021-07-12 DIAGNOSIS — R0602 Shortness of breath: Secondary | ICD-10-CM | POA: Diagnosis not present

## 2021-07-12 DIAGNOSIS — G8929 Other chronic pain: Secondary | ICD-10-CM | POA: Diagnosis not present

## 2021-07-12 DIAGNOSIS — E1122 Type 2 diabetes mellitus with diabetic chronic kidney disease: Secondary | ICD-10-CM

## 2021-07-12 DIAGNOSIS — Z23 Encounter for immunization: Secondary | ICD-10-CM

## 2021-07-12 DIAGNOSIS — R5383 Other fatigue: Secondary | ICD-10-CM

## 2021-07-12 DIAGNOSIS — I129 Hypertensive chronic kidney disease with stage 1 through stage 4 chronic kidney disease, or unspecified chronic kidney disease: Secondary | ICD-10-CM

## 2021-07-12 DIAGNOSIS — M25561 Pain in right knee: Secondary | ICD-10-CM | POA: Diagnosis not present

## 2021-07-12 DIAGNOSIS — Z6837 Body mass index (BMI) 37.0-37.9, adult: Secondary | ICD-10-CM

## 2021-07-12 DIAGNOSIS — N1831 Chronic kidney disease, stage 3a: Secondary | ICD-10-CM

## 2021-07-12 DIAGNOSIS — N390 Urinary tract infection, site not specified: Secondary | ICD-10-CM | POA: Diagnosis not present

## 2021-07-12 DIAGNOSIS — M25552 Pain in left hip: Secondary | ICD-10-CM

## 2021-07-12 DIAGNOSIS — N183 Chronic kidney disease, stage 3 unspecified: Secondary | ICD-10-CM

## 2021-07-12 LAB — POCT URINALYSIS DIPSTICK
Bilirubin, UA: NEGATIVE
Blood, UA: NEGATIVE
Glucose, UA: NEGATIVE
Ketones, UA: NEGATIVE
Nitrite, UA: POSITIVE
Protein, UA: NEGATIVE
Spec Grav, UA: 1.015 (ref 1.010–1.025)
Urobilinogen, UA: 0.2 E.U./dL
pH, UA: 5.5 (ref 5.0–8.0)

## 2021-07-12 NOTE — Patient Instructions (Signed)
Health Maintenance, Female Adopting a healthy lifestyle and getting preventive care are important in promoting health and wellness. Ask your health care provider about: The right schedule for you to have regular tests and exams. Things you can do on your own to prevent diseases and keep yourself healthy. What should I know about diet, weight, and exercise? Eat a healthy diet  Eat a diet that includes plenty of vegetables, fruits, low-fat dairy products, and lean protein. Do not eat a lot of foods that are high in solid fats, added sugars, or sodium. Maintain a healthy weight Body mass index (BMI) is used to identify weight problems. It estimates body fat based on height and weight. Your health care provider can help determine your BMI and help you achieve or maintain a healthy weight. Get regular exercise Get regular exercise. This is one of the most important things you can do for your health. Most adults should: Exercise for at least 150 minutes each week. The exercise should increase your heart rate and make you sweat (moderate-intensity exercise). Do strengthening exercises at least twice a week. This is in addition to the moderate-intensity exercise. Spend less time sitting. Even light physical activity can be beneficial. Watch cholesterol and blood lipids Have your blood tested for lipids and cholesterol at 79 years of age, then have this test every 5 years. Have your cholesterol levels checked more often if: Your lipid or cholesterol levels are high. You are older than 79 years of age. You are at high risk for heart disease. What should I know about cancer screening? Depending on your health history and family history, you may need to have cancer screening at various ages. This may include screening for: Breast cancer. Cervical cancer. Colorectal cancer. Skin cancer. Lung cancer. What should I know about heart disease, diabetes, and high blood pressure? Blood pressure and heart  disease High blood pressure causes heart disease and increases the risk of stroke. This is more likely to develop in people who have high blood pressure readings, are of African descent, or are overweight. Have your blood pressure checked: Every 3-5 years if you are 18-39 years of age. Every year if you are 40 years old or older. Diabetes Have regular diabetes screenings. This checks your fasting blood sugar level. Have the screening done: Once every three years after age 40 if you are at a normal weight and have a low risk for diabetes. More often and at a younger age if you are overweight or have a high risk for diabetes. What should I know about preventing infection? Hepatitis B If you have a higher risk for hepatitis B, you should be screened for this virus. Talk with your health care provider to find out if you are at risk for hepatitis B infection. Hepatitis C Testing is recommended for: Everyone born from 1945 through 1965. Anyone with known risk factors for hepatitis C. Sexually transmitted infections (STIs) Get screened for STIs, including gonorrhea and chlamydia, if: You are sexually active and are younger than 79 years of age. You are older than 79 years of age and your health care provider tells you that you are at risk for this type of infection. Your sexual activity has changed since you were last screened, and you are at increased risk for chlamydia or gonorrhea. Ask your health care provider if you are at risk. Ask your health care provider about whether you are at high risk for HIV. Your health care provider may recommend a prescription medicine   to help prevent HIV infection. If you choose to take medicine to prevent HIV, you should first get tested for HIV. You should then be tested every 3 months for as long as you are taking the medicine. Pregnancy If you are about to stop having your period (premenopausal) and you may become pregnant, seek counseling before you get  pregnant. Take 400 to 800 micrograms (mcg) of folic acid every day if you become pregnant. Ask for birth control (contraception) if you want to prevent pregnancy. Osteoporosis and menopause Osteoporosis is a disease in which the bones lose minerals and strength with aging. This can result in bone fractures. If you are 65 years old or older, or if you are at risk for osteoporosis and fractures, ask your health care provider if you should: Be screened for bone loss. Take a calcium or vitamin D supplement to lower your risk of fractures. Be given hormone replacement therapy (HRT) to treat symptoms of menopause. Follow these instructions at home: Lifestyle Do not use any products that contain nicotine or tobacco, such as cigarettes, e-cigarettes, and chewing tobacco. If you need help quitting, ask your health care provider. Do not use street drugs. Do not share needles. Ask your health care provider for help if you need support or information about quitting drugs. Alcohol use Do not drink alcohol if: Your health care provider tells you not to drink. You are pregnant, may be pregnant, or are planning to become pregnant. If you drink alcohol: Limit how much you use to 0-1 drink a day. Limit intake if you are breastfeeding. Be aware of how much alcohol is in your drink. In the [place] one drink equals one 12 oz bottle of beer (355 mL), one 5 oz glass of wine (148 mL), or one 1 oz glass of hard liquor (44 mL). General instructions Schedule regular health, dental, and eye exams. Stay current with your vaccines. Tell your health care provider if: You often feel depressed. You have ever been abused or do not feel safe at home. Summary Adopting a healthy lifestyle and getting preventive care are important in promoting health and wellness. Follow your health care provider's instructions about healthy diet, exercising, and getting tested or screened for diseases. Follow your health care provider's  instructions on monitoring your cholesterol and blood pressure. This information is not intended to replace advice given to you by your health care provider. Make sure you discuss any questions you have with your health care provider. Document Revised: 12/24/2020 Document Reviewed: 10/09/2018 Elsevier Patient Education  2022 [employer]  

## 2021-07-12 NOTE — Progress Notes (Signed)
I,Tianna Badgett,acting as a Education administrator for Maximino Greenland, MD.,have documented all relevant documentation on the behalf of Maximino Greenland, MD,as directed by  Maximino Greenland, MD while in the presence of Maximino Greenland, MD.  This visit occurred during the SARS-CoV-2 public health emergency.  Safety protocols were in place, including screening questions prior to the visit, additional usage of staff PPE, and extensive cleaning of exam room while observing appropriate contact time as indicated for disinfecting solutions.  Subjective:     Patient ID: [name] , female    DOB: 1941-04-07 , 80 y.o.   MRN: [id number]   Chief Complaint  Patient presents with   Annual Exam   Diabetes   Hypertension    HPI  The patient is here for physical exam. The patient said she has occasional SOB at bedtime. She thinks it is due to dust/mold in her home, she is currently going through some renovations. Denies associated chest pain and palpitations. Denies SOB w/ movement; however, admits she is not gettting much exercise. She does admit to having some hip pain which inhibits her ability to exercise regularly.   Diabetes She presents for her follow-up diabetic visit. She has type 2 diabetes mellitus. There are no hypoglycemic associated symptoms. Associated symptoms include fatigue. Pertinent negatives for diabetes include no blurred vision and no chest pain. There are no hypoglycemic complications. Risk factors for coronary artery disease include diabetes mellitus, dyslipidemia, hypertension, obesity, sedentary lifestyle and post-menopausal. She is compliant with treatment most of the time. She participates in exercise intermittently. An ACE inhibitor/angiotensin II receptor blocker is being taken. Eye exam is current.  Hypertension This is a chronic problem. The current episode started more than 1 year ago. The problem has been gradually improving since onset. The problem is controlled. Associated symptoms  include shortness of breath. Pertinent negatives include no blurred vision or chest pain. Risk factors for coronary artery disease include diabetes mellitus, dyslipidemia, obesity, post-menopausal state and sedentary lifestyle.    Past Medical History:  Diagnosis Date   Abdominal pain    Arthritis    cervical disc degeneration/ oa left knee, carpal tunnel rt wrist, adhesive capsulitis right shoulder, rt hand weakness; lumbar degeneration   Carpal tunnel syndrome    Complication of anesthesia 2006-at [facility]   breathing problems-no BP med given prior to surgery;  hx of being very sleepy after colon surgery --  states no problems with last right total knee replacement 2013   Constipation    Diabetes mellitus without complication ([place])    borderline - diet control   Diverticulitis hx of   Diverticulosis    E. coli infection    2020   Frequent UTI    hx of urethral injury during colon surgery - states frequent uti's since   GERD (gastroesophageal reflux disease)    H/O hiatal hernia    History of palpitations    in the past   History of shingles    has a lingering itching on back where shingles were   Hyperlipidemia    Hypertension    Numbness and tingling in right hand    pt. states has numbness of right hand very frequently-watch positioning   Obesity    Osteoarthritis    Osteoporosis    Pain    pain left knee and pain right hip and right groin   Personal history of colonic polyps-adenoma 08/26/2008   Pneumonia 2005   Vitamin D deficiency      Family  History  Problem Relation Age of Onset   Breast cancer Mother 39   Hypertension Mother    Prostate cancer Father    Hypertension Father    Dementia Sister    Lung cancer Brother    Hypertension Brother    COPD Brother    Cancer Maternal Grandmother    Arthritis Sister    Hypertension Sister    Arthritis Sister    Hypertension Child    Hypertension Child    Hypertension Child    Colon polyps Neg Hx    Esophageal  cancer Neg Hx    Rectal cancer Neg Hx    Stomach cancer Neg Hx    Colon cancer Neg Hx      Current Outpatient Medications:    allopurinol (ZYLOPRIM) 300 MG tablet, TAKE 1 TABLET(300 MG) BY MOUTH DAILY, Disp: 90 tablet, Rfl: 0   aspirin EC 81 MG tablet, Take 1 tablet (81 mg total) by mouth daily., Disp: 90 tablet, Rfl: 0   atenolol-chlorthalidone (TENORETIC) 50-25 MG tablet, TAKE 1 TABLET BY MOUTH DAILY, Disp: 90 tablet, Rfl: 0   benazepril (LOTENSIN) 10 MG tablet, Take 10 mg by mouth daily., Disp: , Rfl:    Cyanocobalamin (VITAMIN B12 PO), Take by mouth., Disp: , Rfl:    Dulaglutide (TRULICITY) 1.32 GM/0.1UU SOPN, INJECT 0.5 ML(0.75 MG) SUBCUTANEOUSLY EVERY WEEK IN ABDOMEN, THIGH OR UPPER ARM ROTATING INJECTION SITE, Disp: 2 mL, Rfl: 0   hydrOXYzine (ATARAX/VISTARIL) 10 MG tablet, Take 1 tablet (10 mg total) by mouth 3 (three) times daily as needed., Disp: 30 tablet, Rfl: 0   Lancets (ONETOUCH DELICA PLUS [id number]) MISC, USE TO CHECK BLOOD SUGAR TWICE DAILY, Disp: 100 each, Rfl: 3   levocetirizine (XYZAL) 5 MG tablet, Take 1 tablet (5 mg total) by mouth every evening., Disp: 30 tablet, Rfl: 5   Magnesium 250 MG TABS, Take 1 tablet (250 mg total) by mouth every evening. Take with dinner meal, Disp: 90 tablet, Rfl: 1   Multiple Vitamins-Minerals (ZINC PO), Take 1 tablet by mouth daily., Disp: , Rfl:    ONETOUCH VERIO test strip, USE TO TEST 2 TIMES DAILY, Disp: 100 strip, Rfl: 3   Probiotic Product (PROBIOTIC PO), Take 1 capsule by mouth daily. , Disp: , Rfl:    rosuvastatin (CRESTOR) 20 MG tablet, TAKE 1 TABLET(20 MG) BY MOUTH DAILY, Disp: 90 tablet, Rfl: 0   Vitamin D, Ergocalciferol, (DRISDOL) 1.25 MG (50000 UNIT) CAPS capsule, TAKE 1 CAPSULE BY MOUTH TWICE WEEKLY ON TUESDAYS AND FRIDAYS, Disp: 26 capsule, Rfl: 0   Allergies  Allergen Reactions   Atorvastatin Calcium Itching   Bee Venom Hives   Oxycodone Other (See Comments)    hallucinations   Vicodin [Hydrocodone-Acetaminophen]  Other (See Comments)    hallucinations      The patient states she uses post menopausal status for birth control. Last LMP was No LMP recorded. Patient has had a hysterectomy.. Negative for Dysmenorrhea. Negative for: breast discharge, breast lump(s), breast pain and breast self exam. Associated symptoms include abnormal vaginal bleeding. Pertinent negatives include abnormal bleeding (hematology), anxiety, decreased libido, depression, difficulty falling sleep, dyspareunia, history of infertility, nocturia, sexual dysfunction, sleep disturbances, urinary incontinence, urinary urgency, vaginal discharge and vaginal itching. Diet regular.The patient states her exercise level is  intermittent.  . The patient's tobacco use is:  Social History   Tobacco Use  Smoking Status Never  Smokeless Tobacco Never  . She has been exposed to passive smoke. The patient's alcohol use is:  Social History   Substance and Sexual Activity  Alcohol Use No   Review of Systems  Constitutional:  Positive for fatigue.  HENT: Negative.    Eyes: Negative.  Negative for blurred vision.  Respiratory:  Positive for shortness of breath.        C/o SOB.   Cardiovascular: Negative.  Negative for chest pain.  Gastrointestinal: Negative.   Endocrine: Negative.   Genitourinary: Negative.   Musculoskeletal:  Positive for arthralgias.  Skin: Negative.   Allergic/Immunologic: Negative.   Neurological: Negative.   Hematological: Negative.   Psychiatric/Behavioral: Negative.      Today's Vitals   07/12/21 0921 07/12/21 0937  BP: (!) 148/76 132/70  Pulse: (!) 59   Temp: 97.7 F (36.5 C)   TempSrc: Oral   SpO2: 98%   Weight: 236 lb 3.2 oz (107.1 kg)   Height: 5' 6.8" (1.697 m)   PainSc: 0-No pain    Body mass index is 37.22 kg/m.  Wt Readings from Last 3 Encounters:  07/12/21 236 lb 3.2 oz (107.1 kg)  05/19/21 235 lb (106.6 kg)  04/12/21 235 lb 3.2 oz (106.7 kg)    BP Readings from Last 3 Encounters:   07/12/21 132/70  04/12/21 (!) 150/78  02/24/21 (!) 160/70    Objective:  Physical Exam Vitals and nursing note reviewed.  Constitutional:      Appearance: Normal appearance. She is obese.  HENT:     Head: Normocephalic and atraumatic.     Right Ear: Tympanic membrane, ear canal and external ear normal.     Left Ear: Tympanic membrane, ear canal and external ear normal.     Nose:     Comments: Masked     Mouth/Throat:     Comments: Masked  Eyes:     Extraocular Movements: Extraocular movements intact.     Conjunctiva/sclera: Conjunctivae normal.     Pupils: Pupils are equal, round, and reactive to light.  Cardiovascular:     Rate and Rhythm: Normal rate and regular rhythm.     Pulses:          Dorsalis pedis pulses are 1+ on the right side and 1+ on the left side.     Heart sounds: Normal heart sounds.  Pulmonary:     Effort: Pulmonary effort is normal.     Breath sounds: Normal breath sounds.  Abdominal:     General: Bowel sounds are normal.     Palpations: Abdomen is soft.     Tenderness: There is no abdominal tenderness.     Comments: Obese, soft, difficult to assess organomegaly  Genitourinary:    Comments: deferred Musculoskeletal:        General: Tenderness present. Normal range of motion.     Cervical back: Normal range of motion and neck supple.  Feet:     Right foot:     Protective Sensation: 5 sites tested.  5 sites sensed.     Skin integrity: Dry skin present.     Toenail Condition: Right toenails are long.     Left foot:     Protective Sensation: 5 sites tested.  5 sites sensed.     Skin integrity: Dry skin present.     Toenail Condition: Left toenails are long.  Skin:    General: Skin is warm and dry.  Neurological:     General: No focal deficit present.     Mental Status: She is alert and oriented to person, place, and time.  Psychiatric:  Mood and Affect: Mood normal.        Behavior: Behavior normal.        Assessment And Plan:      1. Encounter for annual physical exam Comments: A full exam was performed. Importance of monthly self breast exams was discussed with the patient. PATIENT IS ADVISED TO GET 30-45 MINUTES REGULAR EXERCISE NO LESS THAN FOUR TO FIVE DAYS PER WEEK - BOTH WEIGHTBEARING EXERCISES AND AEROBIC ARE RECOMMENDED.  PATIENT IS ADVISED TO FOLLOW A HEALTHY DIET WITH AT LEAST SIX FRUITS/VEGGIES PER DAY, DECREASE INTAKE OF RED MEAT, AND TO INCREASE FISH INTAKE TO TWO DAYS PER WEEK.  MEATS/FISH SHOULD NOT BE FRIED, BAKED OR BROILED IS PREFERABLE.  IT IS ALSO IMPORTANT TO CUT BACK ON YOUR SUGAR INTAKE. PLEASE AVOID ANYTHING WITH ADDED SUGAR, CORN SYRUP OR OTHER SWEETENERS. IF YOU MUST USE A SWEETENER, YOU CAN TRY STEVIA. IT IS ALSO IMPORTANT TO AVOID ARTIFICIALLY SWEETENERS AND DIET BEVERAGES. LASTLY, I SUGGEST WEARING SPF 50 SUNSCREEN ON EXPOSED PARTS AND ESPECIALLY WHEN IN THE DIRECT SUNLIGHT FOR AN EXTENDED PERIOD OF TIME.  PLEASE AVOID FAST FOOD RESTAURANTS AND INCREASE YOUR WATER INTAKE.  - MM DIGITAL SCREENING BILATERAL; Future  2. Type 2 diabetes mellitus with stage 3a chronic kidney disease, without long-term current use of insulin (HCC) Comments: Diabetic foot exam was performed. I will check labs as listed below. I DISCUSSED WITH THE PATIENT AT LENGTH REGARDING THE GOALS OF GLYCEMIC CONTROL AND POSSIBLE LONG-TERM COMPLICATIONS.  I  ALSO STRESSED THE IMPORTANCE OF COMPLIANCE WITH HOME GLUCOSE MONITORING, DIETARY RESTRICTIONS INCLUDING AVOIDANCE OF SUGARY DRINKS/PROCESSED FOODS,  ALONG WITH REGULAR EXERCISE.  I  ALSO STRESSED THE IMPORTANCE OF ANNUAL EYE EXAMS, SELF FOOT CARE AND COMPLIANCE WITH OFFICE VISITS.  - CBC - Hemoglobin A1c - CMP14+EGFR - Lipid panel  3. Hypertensive nephropathy Comments: Chronic, fair control. EKG performed, NSR w/ LBBB, w/ inf. infarct - this is a new change. I will refer her to Cardiology for further evaluation. Encouraged to follow low sodium diet. Goal BP is less than 130/80. -  POCT Urinalysis Dipstick (81002) - Microalbumin / Creatinine Urine Ratio - EKG 12-Lead  4. Fatigue, unspecified type Comments: Likely multifactorial. She also reports non-restorative sleep, nocturia and daytime somnolence. She agrees to Neuro eval for sleep study evaluation. - Ambulatory referral to Neurology  5. Shortness of breath Comments: Possibly due to deconditioning; however, given cardiac risk factors and EKG changes, will refer her back to Cardiology for further evaluation.  - Ambulatory referral to Cardiology  6. Chronic pain of right knee Comments: I will refer her to Dr. Wynelle Link at [facility] as requested. Her sx are likely due to OA.  - Ambulatory referral to Orthopedic Surgery  7. Urinary tract infection without hematuria, site unspecified Comments: Recurrent, u/a pos for trace leuks and pos nitrites.  I will send off urine culture. - Culture, Urine  8. Class 2 severe obesity due to excess calories with serious comorbidity and body mass index (BMI) of 37.0 to 37.9 in adult [facility]) Comments: She is encouraged to strive for BMI less than 30 to decrease cardiac risk. Advised to aim for at least 150 minutes of exercise per week.   9. Immunization due Comments: She was given high dose flu.  - Flu Vaccine QUAD High Dose(Fluad)  Patient was given opportunity to ask questions. Patient verbalized understanding of the plan and was able to repeat key elements of the plan. All questions were answered to their satisfaction.   I,  Maximino Greenland, MD, have reviewed all documentation for this visit. The documentation on 07/17/21 for the exam, diagnosis, procedures, and orders are all accurate and complete.  THE PATIENT IS ENCOURAGED TO PRACTICE SOCIAL DISTANCING DUE TO THE COVID-19 PANDEMIC.

## 2021-07-13 ENCOUNTER — Encounter: Payer: Self-pay | Admitting: Internal Medicine

## 2021-07-13 LAB — CBC

## 2021-07-13 LAB — CMP14+EGFR
ALT: 25 IU/L (ref 0–32)
AST: 35 IU/L (ref 0–40)
Albumin/Globulin Ratio: 1.5 (ref 1.2–2.2)
Albumin: 4 g/dL (ref 3.7–4.7)
Alkaline Phosphatase: 56 IU/L (ref 44–121)
BUN/Creatinine Ratio: 22 (ref 12–28)
BUN: 25 mg/dL (ref 8–27)
Bilirubin Total: 0.8 mg/dL (ref 0.0–1.2)
CO2: 19 mmol/L — ABNORMAL LOW (ref 20–29)
Calcium: 9.4 mg/dL (ref 8.7–10.3)
Chloride: 101 mmol/L (ref 96–106)
Creatinine, Ser: 1.13 mg/dL — ABNORMAL HIGH (ref 0.57–1.00)
Globulin, Total: 2.6 g/dL (ref 1.5–4.5)
Glucose: 107 mg/dL — ABNORMAL HIGH (ref 65–99)
Potassium: 4.6 mmol/L (ref 3.5–5.2)
Sodium: 141 mmol/L (ref 134–144)
Total Protein: 6.6 g/dL (ref 6.0–8.5)
eGFR: 49 mL/min/{1.73_m2} — ABNORMAL LOW (ref >59)

## 2021-07-13 LAB — MICROALBUMIN / CREATININE URINE RATIO
Creatinine, Urine: 76.9 mg/dL
Microalb/Creat Ratio: 4 mg/g creat (ref 0–29)
Microalbumin, Urine: 3.4 ug/mL

## 2021-07-13 LAB — HEMOGLOBIN A1C
Est. average glucose Bld gHb Est-mCnc: 146 mg/dL
Hgb A1c MFr Bld: 6.7 % — ABNORMAL HIGH (ref 4.8–5.6)

## 2021-07-14 ENCOUNTER — Telehealth: Payer: Medicare Other

## 2021-07-14 ENCOUNTER — Ambulatory Visit: Payer: Self-pay

## 2021-07-14 DIAGNOSIS — N183 Chronic kidney disease, stage 3 unspecified: Secondary | ICD-10-CM

## 2021-07-14 DIAGNOSIS — I129 Hypertensive chronic kidney disease with stage 1 through stage 4 chronic kidney disease, or unspecified chronic kidney disease: Secondary | ICD-10-CM

## 2021-07-14 DIAGNOSIS — N1831 Chronic kidney disease, stage 3a: Secondary | ICD-10-CM

## 2021-07-14 DIAGNOSIS — E1122 Type 2 diabetes mellitus with diabetic chronic kidney disease: Secondary | ICD-10-CM

## 2021-07-14 NOTE — Progress Notes (Signed)
This encounter was created in error - please disregard.

## 2021-07-17 DIAGNOSIS — N1831 Chronic kidney disease, stage 3a: Secondary | ICD-10-CM | POA: Insufficient documentation

## 2021-07-18 ENCOUNTER — Other Ambulatory Visit: Payer: Self-pay | Admitting: Internal Medicine

## 2021-07-18 LAB — URINE CULTURE

## 2021-07-18 MED ORDER — CEPHALEXIN 500 MG PO CAPS: 500.0000 mg | ORAL_CAPSULE | Freq: Two times a day (BID) | ORAL | 0 refills | Status: DC

## 2021-07-19 ENCOUNTER — Telehealth: Payer: Self-pay

## 2021-07-19 NOTE — Patient Instructions (Signed)
Visit Information  PATIENT GOALS:  Goals Addressed      CKD progression prevented or minimized   On track    Timeframe:  Long-Range Goal Priority:  High Start Date: 07/14/21                            Expected End Date: 07/14/22       Next Scheduled follow up call: 10/13/21        Patient Goals: - increase water to 64 oz daily - keep blood pressure and diabetes under good control - follow MD recommendations for management of chronic kidney disease                Glycemic Management Optimized   On track    Timeframe:  Long-Range Goal Priority:  High Start Date: 07/14/21                            Expected End Date: 07/14/22                        Next Scheduled Follow up date: 10/13/21  Patient Goals: - check blood sugar at prescribed times - check blood sugar if I feel it is too high or too low - take the blood sugar log to all doctor visits - drink 6 to 8 glasses of water each day - manage portion size - Self administers oral medications as prescribed - Self administers injectable DM medication (Trulicity) as prescribed - Attends all scheduled provider appointments - Checks blood sugars as prescribed and utilize hyper and hypoglycemia protocol as needed - Adheres to prescribed ADA/carb modified - keep appointment with eye doctor - schedule appointment with eye doctor - check feet daily for cuts, sores or redness - do heel pump exercise 2 to 3 times each day - keep feet up while sitting - trim toenails straight across - wash and dry feet carefully every day - wear comfortable, cotton socks - wear comfortable, well-fitting shoes     Obtain Diabetes Eye Exam   On track    Timeframe:  Long-Range Goal Priority:  High Start Date: 07/14/21                            Expected End Date: 07/14/22    Next Scheduled Follow up date: 10/13/21     - keep appointment with eye doctor - schedule appointment with eye doctor                   Obtain Foot Exam   On track     Timeframe:  Long-Range Goal Priority:  High Start Date:  07/14/21                           Expected End Date:  07/14/22  Next Scheduled Follow up date: 10/13/21      - check feet daily for cuts, sores or redness - do heel pump exercise 2 to 3 times each day - keep feet up while sitting - trim toenails straight across - wash and dry feet carefully every day - wear comfortable, cotton socks - wear comfortable, well-fitting shoes                      Urinary Incontinence symptoms managed   On  track    Timeframe:  Long-Range Goal Priority:  High Start Date:  05/18/21                           Expected End Date:  05/18/22  Next Scheduled follow up date: 10/13/21       Patient Goals: - increase water to 64 oz daily - empty bladder when feeling the urge - continue to use good perineal hygiene                 Vitamin D deficiency complications prevented or minimized       Timeframe:  Long-Range Goal Priority:  High Start Date:  05/18/21                            Expected End Date:  05/18/22  Next Scheduled follow up: 10/13/21        Self Care Activities:  Self administers medications as prescribed Attends all scheduled provider appointments Calls pharmacy for medication refills Calls provider office for new concerns or questions Patient Goals: - Eat Vitamin D rich foods - get 15 minutes of natural sunlight daily when possible - resume taking Vitamin D as prescribed                       The patient verbalized understanding of instructions, educational materials, and care plan provided today and declined offer to receive copy of patient instructions, educational materials, and care plan.   Telephone follow up appointment with care management team member scheduled for: 10/13/21  Barb Merino, RN, BSN, CCM Care Management Coordinator [facility]  Direct Phone: [phone]

## 2021-07-19 NOTE — Chronic Care Management (AMB) (Addendum)
Chronic Care Management   CCM RN Visit Note  07/14/2021 Name: [name] MRN: [id number] DOB: Mar 25, 1941  Subjective: [name] is a 80 y.o. year old female who is a primary care patient of [name], MD. The care management team was consulted for assistance with disease management and care coordination needs.    Engaged with patient by telephone for follow up visit in response to provider referral for case management and/or care coordination services.   Consent to Services:  The patient was given information about Chronic Care Management services, agreed to services, and gave verbal consent prior to initiation of services.  Please see initial visit note for detailed documentation.   Patient agreed to services and verbal consent obtained.   Assessment: Review of patient past medical history, allergies, medications, health status, including review of consultants reports, laboratory and other test data, was performed as part of comprehensive evaluation and provision of chronic care management services.   SDOH (Social Determinants of Health) assessments and interventions performed:    CCM Care Plan  Allergies  Allergen Reactions   Atorvastatin Calcium Itching   Bee Venom Hives   Oxycodone Other (See Comments)    hallucinations   Vicodin [Hydrocodone-Acetaminophen] Other (See Comments)    hallucinations    Outpatient Encounter Medications as of 07/14/2021  Medication Sig Note   atenolol-chlorthalidone (TENORETIC) 50-25 MG tablet TAKE 1 TABLET BY MOUTH DAILY    benazepril (LOTENSIN) 10 MG tablet Take 10 mg by mouth daily.    Dulaglutide (TRULICITY) 1.54 MG/8.6PY SOPN INJECT 0.5 ML(0.75 MG) SUBCUTANEOUSLY EVERY WEEK IN ABDOMEN, THIGH OR UPPER ARM ROTATING INJECTION SITE 11/04/2020: Per endocrinologist patient to take trulicity every other week.    allopurinol (ZYLOPRIM) 300 MG tablet TAKE 1 TABLET(300 MG) BY MOUTH DAILY    aspirin EC 81 MG tablet Take 1 tablet (81 mg total)  by mouth daily.    Cyanocobalamin (VITAMIN B12 PO) Take by mouth.    hydrOXYzine (ATARAX/VISTARIL) 10 MG tablet Take 1 tablet (10 mg total) by mouth 3 (three) times daily as needed.    Lancets (ONETOUCH DELICA PLUS [id number]) MISC USE TO CHECK BLOOD SUGAR TWICE DAILY    levocetirizine (XYZAL) 5 MG tablet Take 1 tablet (5 mg total) by mouth every evening.    Magnesium 250 MG TABS Take 1 tablet (250 mg total) by mouth every evening. Take with dinner meal    Multiple Vitamins-Minerals (ZINC PO) Take 1 tablet by mouth daily.    ONETOUCH VERIO test strip USE TO TEST 2 TIMES DAILY    Probiotic Product (PROBIOTIC PO) Take 1 capsule by mouth daily.     rosuvastatin (CRESTOR) 20 MG tablet TAKE 1 TABLET(20 MG) BY MOUTH DAILY    Vitamin D, Ergocalciferol, (DRISDOL) 1.25 MG (50000 UNIT) CAPS capsule TAKE 1 CAPSULE BY MOUTH TWICE WEEKLY ON TUESDAYS AND FRIDAYS    No facility-administered encounter medications on file as of 07/14/2021.    Patient Active Problem List   Diagnosis Date Noted   Stage 3a chronic kidney disease ([place]) 07/17/2021   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   Aortic atherosclerosis ([place]) 01/03/2021   Class 2 severe obesity due to excess calories with serious comorbidity and body mass index (BMI) of 39.0 to 39.9 in adult [facility]) 01/03/2021   Pruritus 01/03/2021   Vitamin D deficiency 06/01/2020   CAP (community acquired pneumonia) 05/06/2020   Abdominal bloating 02/17/2019   Urinary frequency 02/17/2019   Uncontrolled type 2 diabetes mellitus with hyperglycemia ([place]) 02/17/2019   Lightheaded 02/17/2019  Constipation 06/26/2018   Hyperlipidemia 08/23/2014   Type 2 diabetes mellitus with stage 3 chronic kidney disease, without long-term current use of insulin ([place]) 06/19/2014   Mixed hyperlipidemia 06/19/2014   S/P urological surgery 09/30/2013   Joint pain 09/30/2013   UTI (urinary tract infection) 09/30/2013   Cough 12/10/2012   OA (osteoarthritis) of knee 01/05/2012   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 08/02/2011   Vaginal discharge 08/02/2011   Spasm of lumbar paraspinous muscle 06/17/2011   Morbidly obese ([place]) 05/31/2011   HIP PAIN, LEFT, CHRONIC 11/09/2010   CARPAL TUNNEL SYNDROME, RIGHT 10/14/2010   KNEE PAIN, RIGHT, CHRONIC 10/14/2010   LEFT BUNDLE BRANCH BLOCK 95/06/3266   HELICOBACTER PYLORI GASTRITIS 07/18/2010   FATIGUE 07/01/2010   CHEST PAIN, ATYPICAL 07/01/2010   EPIGASTRIC PAIN 07/01/2010   SUPRAPUBIC PAIN 07/01/2010   HYPERGLYCEMIA 07/01/2010   CELLULITIS AND ABSCESS OF OTHER SPECIFIED SITE 02/15/2010   LUMP OR MASS IN BREAST 01/10/2010   DYSURIA, CHRONIC 03/09/2009   GERD 10/06/2008   Personal history of colonic polyps-adenoma 08/26/2008   LOC OSTEOARTHROS NOT SPEC PRIM/SEC LOWER LEG 09/10/2007   GOUT, UNSPECIFIED 09/09/2007   DIVERTICULOSIS, COLON 09/09/2007   DIVERTICULITIS, HX OF 09/09/2007   HYPERLIPIDEMIA 06/03/2007   Essential hypertension 06/03/2007    Conditions to be addressed/monitored:HTN, DMII, and CKD Stage III  Care Plan : Urinary Incontinence (Adult)  Updates made by Lynne Logan, RN since 07/14/2021 12:00 AM     Problem: Symptom Management (Urinary Incontinence)   Priority: High     Long-Range Goal: Urinary Incontinence Symptoms Manged   Start Date: 05/18/2021  Expected End Date: 05/18/2022  Recent Progress: On track  Priority: High  Note:   Current Barriers:  Ineffective Self Health Maintenance in a patient with DM, HTN, CKD III Clinical Goal(s):  Collaboration with [name], MD regarding development and update of comprehensive plan of care as evidenced by provider attestation and co-signature Inter-disciplinary care team collaboration (see longitudinal plan of care) patient will work with care management team to address care coordination and chronic disease management needs related to Disease Management Educational Needs Care Coordination Medication Management and Education Psychosocial  Support   Interventions:  07/14/21 completed successful inbound call with patient  Evaluation of current treatment plan related to  Impaired Urinary Elimination , self-management and patient's adherence to plan as established by provider. Collaboration with [name], MD regarding development and update of comprehensive plan of care as evidenced by provider attestation       and co-signature Inter-disciplinary care team collaboration (see longitudinal plan of care) Review of patient status, including review of consultant's reports, relevant laboratory and other test results, and medications completed. Informed patient her urine culture is pending and she will be notified of final results and treatment recommendations Reviewed medications with patient and discussed importance of medication adherence and the importance to complete full course of antibiotics  Educated patient on the importance of increasing her water to 64 oz daily, to empty her bladder when she first feels the urge and remember to use good perineal hygiene as directed  Discussed plans with patient for ongoing care management follow up and provided patient with direct contact information for care management team Self Care Activities:  Self administers medications as prescribed Attends all scheduled provider appointments Calls pharmacy for medication refills Calls provider office for new concerns or questions Patient Goals: - increase water to 64 oz daily - empty bladder when feeling the urge - continue to use good perineal  hygiene   Follow Up Plan: Telephone follow up appointment with care management team member scheduled for: 10/13/21     Care Plan : Vitamin D deficiency  Updates made by Lynne Logan, RN since 07/14/2021 12:00 AM     Problem: Vitamin D deficiency   Priority: High     Long-Range Goal: Vitamin D deficiency complications prevented or minimized   Start Date: 05/18/2021  Expected End Date: 05/18/2022   Recent Progress: On track  Priority: High  Note:   Current Barriers:  Ineffective Self Health Maintenance in a patient with DM, HTN, CKD III Clinical Goal(s):  Collaboration with [name], MD regarding development and update of comprehensive plan of care as evidenced by provider attestation and co-signature Inter-disciplinary care team collaboration (see longitudinal plan of care) patient will work with care management team to address care coordination and chronic disease management needs related to Disease Management Educational Needs Care Coordination Medication Management and Education Psychosocial Support   Interventions:  05/18/21 completed successful inbound call with patient  Evaluation of current treatment plan related to  Vitamin D deficiency , self-management and patient's adherence to plan as established by provider. Collaboration with [name], MD regarding development and update of comprehensive plan of care as evidenced by provider attestation       and co-signature Inter-disciplinary care team collaboration (see longitudinal plan of care) Provided education to patient about basic disease process related to Vitamin D deficiency Review of patient status, including review of consultant's reports, relevant laboratory and other test results, and medications completed. Reviewed medications with patient and discussed importance of medication adherence Determined patient stopped her Vitamin D several months ago; Educated on importance of medication adherence Educated on importance of getting 15 minutes of natural sunlight when possible, eat Vitamin D rich foods, resume taking Vitamin D as prescribed Mailed printed educational materials related to Vitamin D deficiency  Discussed plans with patient for ongoing care management follow up and provided patient with direct contact information for care management team Self Care Activities:  Self administers medications as  prescribed Attends all scheduled provider appointments Calls pharmacy for medication refills Calls provider office for new concerns or questions Patient Goals: - Eat Vitamin D rich foods - get 15 minutes of natural sunlight daily when possible - resume taking Vitamin D as prescribed   Follow Up Plan: Telephone follow up appointment with care management team member scheduled for: 10/13/21     Care Plan : Chronic Kidney (Adult)  Updates made by Lynne Logan, RN since 07/14/2021 12:00 AM     Problem: Disease Progression   Priority: High     Long-Range Goal: Disease Progression Prevented or Minimized   Start Date: 07/14/2021  Expected End Date: 07/14/2022  This Visit's Progress: On track  Priority: High  Note:    Objective:  Lab Results  Component Value Date   HGBA1C 6.7 (H) 07/12/2021   Lab Results  Component Value Date   CREATININE 1.13 (H) 07/12/2021   CREATININE 1.11 (H) 04/12/2021   CREATININE 0.98 02/18/2021   Lab Results  Component Value Date   EGFR 49 (L) 07/12/2021   Current Barriers:  Ineffective Self Health Maintenance in a patient with DM, HTN, CKD III Clinical Goal(s):  Collaboration with [name], MD regarding development and update of comprehensive plan of care as evidenced by provider attestation and co-signature Inter-disciplinary care team collaboration (see longitudinal plan of care) patient will work with care management team to address care coordination and chronic  disease management needs related to Disease Management Educational Needs Care Coordination Medication Management and Education Medication Reconciliation Psychosocial Support   Interventions:  07/14/21 completed successful outbound call with patient  Evaluation of current treatment plan related to CKD Stage III , self-management and patient's adherence to plan as established by provider. Collaboration with [name], MD regarding development and update of comprehensive plan  of care as evidenced by provider attestation       and co-signature Inter-disciplinary care team collaboration (see longitudinal plan of care) Provided education to patient about basic disease process related to Chronic Kidney disease  Review of patient status, including review of consultant's reports, relevant laboratory and other test results, and medications completed. Reviewed medications with patient and discussed importance of medication adherence Educated on the importance of increasing water to 64 oz daily and keeping BP and Diabetes under good control  Mailed printed educational materials related to Stages of Chronic Kidney disease; Eating Right with Kidney disease Discussed plans with patient for ongoing care management follow up and provided patient with direct contact information for care management team Self Care Activities:  Self administers medications as prescribed Attends all scheduled provider appointments Calls pharmacy for medication refills Calls provider office for new concerns or questions Patient Goals: - increase water to 64 oz daily - keep blood pressure and diabetes under good control - follow MD recommendations for management of chronic kidney disease   Follow Up Plan: Telephone follow up appointment with care management team member scheduled for: 10/13/21     Care Plan : Diabetes Type 2 (Adult)  Updates made by Lynne Logan, RN since 07/14/2021 12:00 AM     Problem: Glycemic Management (Diabetes, Type 2)   Priority: Medium     Long-Range Goal: Glycemic Management Optimized   Start Date: 07/14/2021  Expected End Date: 07/14/2022  This Visit's Progress: On track  Priority: High  Note:   Objective:  Lab Results  Component Value Date   HGBA1C 6.7 (H) 07/12/2021   Lab Results  Component Value Date   CREATININE 1.13 (H) 07/12/2021   CREATININE 1.11 (H) 04/12/2021   CREATININE 0.98 02/18/2021   Lab Results  Component Value Date   EGFR 49 (L)  07/12/2021  Current Barriers:  Knowledge Deficits related to basic Diabetes pathophysiology and self care/management Knowledge Deficits related to medications used for management of diabetes Case Manager Clinical Goal(s):  patient will demonstrate improved adherence to prescribed treatment plan for diabetes self care/management as evidenced by: daily monitoring and recording of CBG  adherence to ADA/ carb modified diet exercise 5 days/week adherence to prescribed medication regimen contacting provider for new or worsened symptoms or questions Interventions:  07/14/21 completed successful outbound call with patient  Collaboration with [name], MD regarding development and update of comprehensive plan of care as evidenced by provider attestation and co-signature Inter-disciplinary care team collaboration (see longitudinal plan of care) Provided education to patient about basic DM disease process Review of patient status, including review of consultant's reports, relevant laboratory and other test results, and medications completed. Reviewed medications with patient and discussed importance of medication adherence Educated patient on dietary and exercise recommendations; daily glycemic control FBS 80-130, <180 after meals;15'15' rule Advised patient, providing education and rationale, to check cbg daily before meals and at bedtime and record, calling the CCM team and or PCP for findings outside established parameters Mailed printed DM educational materials related to Carb Choice list, Meal Planning, Low Carb Smoothies; Mediterranean diet, Diabetes Zone Safety  Tool  Discussed plans with patient for ongoing care management follow up and provided patient with direct contact information for care management team Self-Care Activities Self administers oral medications as prescribed Self administers injectable DM medication (Trulicity) as prescribed Attends all scheduled provider appointments Checks  blood sugars as prescribed and utilize hyper and hypoglycemia protocol as needed Adheres to prescribed ADA/carb modified Patient Goals: - check blood sugar at prescribed times - check blood sugar if I feel it is too high or too low - take the blood sugar log to all doctor visits - drink 6 to 8 glasses of water each day - manage portion size  Follow Up Plan: Telephone follow up appointment with care management team member scheduled for: 10/13/21     Plan:Telephone follow up appointment with care management team member scheduled for:  10/13/21  Barb Merino, RN, BSN, CCM Care Management Coordinator [place] [facility]  Direct Phone: [phone]

## 2021-07-19 NOTE — Chronic Care Management (AMB) (Signed)
  [name] was reminded to have all medications, supplements and any blood glucose and blood pressure readings available for review with [name], Pharm. D, at her telephone visit on 07-20-2021 at 11:30.   Questions: Have you had any recent office visit or specialist visit outside of [place]? Patient stated no  Are there any concerns you would like to discuss during your office visit? Patient states she wants to talk about lab work because Keflex was sent in to her pharmacy but no one called with any results. Patient viewed results online but would like someone to explain.  Are you having any problems obtaining your medications? (Whether it pharmacy issues or cost) Patient stated no  If patient has any PAP medications ask if they are having any problems getting their PAP medication or refill? Patient stated no  Care Gaps: Shingrix overdue Covid booster overdue  Star Rating Drug: Rosuvastatin 20 mg- Last filled 05-30-2021 90 DS [facility] Trulicity 6.55 mg- Patient Assistance Benazepril 10 mg- Last filled 05-30-2021 90 DS [facility] Atenolol/ Chlor 50-25 mg- Last filled 06-02-2021 90 DS [facility].  Any gaps in medications fill history? No  [place] Pharmacist Assistant [phone]

## 2021-07-20 ENCOUNTER — Telehealth: Payer: Self-pay

## 2021-07-26 ENCOUNTER — Ambulatory Visit: Payer: Medicare Other

## 2021-07-29 DIAGNOSIS — I129 Hypertensive chronic kidney disease with stage 1 through stage 4 chronic kidney disease, or unspecified chronic kidney disease: Secondary | ICD-10-CM | POA: Diagnosis not present

## 2021-07-29 DIAGNOSIS — E1122 Type 2 diabetes mellitus with diabetic chronic kidney disease: Secondary | ICD-10-CM

## 2021-07-29 DIAGNOSIS — N183 Chronic kidney disease, stage 3 unspecified: Secondary | ICD-10-CM | POA: Diagnosis not present

## 2021-07-29 DIAGNOSIS — N1831 Chronic kidney disease, stage 3a: Secondary | ICD-10-CM

## 2021-07-29 DIAGNOSIS — I10 Essential (primary) hypertension: Secondary | ICD-10-CM

## 2021-08-01 DIAGNOSIS — Z961 Presence of intraocular lens: Secondary | ICD-10-CM | POA: Diagnosis not present

## 2021-08-01 DIAGNOSIS — H10402 Unspecified chronic conjunctivitis, left eye: Secondary | ICD-10-CM | POA: Diagnosis not present

## 2021-08-04 ENCOUNTER — Telehealth: Payer: Self-pay

## 2021-08-04 NOTE — Chronic Care Management (AMB) (Signed)
Chronic Care Management Pharmacy Assistant   Name: [name]  MRN: [id number] DOB: 1940-11-01  Reason for Encounter: Medication Cost Analysis and Coordination for Enhanced Pharmacy Services.   08/04/2021- Medication cost analysis performed, [facility] is in network and patient will have no change in copay's with using [facility].   08/05/2021-Called patient to review medications to complete onboard with [facility] Adherence Program. Patient unavailable at this time, will return call.   08/08/2021- Called patient to review medication to complete onboard with [facility]. Spoke with patient, medications reviewed, patient will receive first delivery on 08/24/2021 for 90 day supply Vials. Patient aware she will get a call from [facility] prior to delivery to discuss copay's and delivery time. Patient will need the following medications on 08/24/2021: Allopurinol 300 mg- 1 tablet daily Rosuvastatin 20 mg- 1 tablet daily Benazepril 10 mg- 1 tablet daily- Nephrology Asa 81 mg- 1 tablet daily Vitamin B complex - 1 tablet daily Probiotic daily- 1 tablet daily Vitamin B12- 1 tablet daily Magnesium 250 mg- 1 tablet daily  Multivitamin - 1 tablet daily Patient will need the following medication before 08/31/2021: Atenolol-Chlorthalidone 50/25 mg- 1 tablet daily Patient will need the following medication before 09/18/2021: Vitamin D 1.25 mg- 1 tablet twice a week on Tuesdays and Fridays  Patient also had questions on how to reorder adult diapers, reviewed chart found last order from [employer] and gave number for patient to call and request reorder, informed patient that order will go to Dr Baird Cancer office for [employer].  Patient also needed number to reorder her Trulicity with [employer] Patient assistance program. Supplied number to patient.   Message sent to PCP care team Laurann Montana, CMA to send 90 day supply refills of above medication due 08/24/2021 to  [facility].  Called [facility] to request 90 day supply refill of Benazepril to be sent to [facility], was transferred to Dr Candiss Norse nurse, no answer, left voicemail of refill and to call with any questions.  Medication coordination completed and uploaded for [name], Pharm D to review.   Medications: Outpatient Encounter Medications as of 08/04/2021  Medication Sig Note   allopurinol (ZYLOPRIM) 300 MG tablet TAKE 1 TABLET(300 MG) BY MOUTH DAILY    aspirin EC 81 MG tablet Take 1 tablet (81 mg total) by mouth daily.    atenolol-chlorthalidone (TENORETIC) 50-25 MG tablet TAKE 1 TABLET BY MOUTH DAILY    benazepril (LOTENSIN) 10 MG tablet Take 10 mg by mouth daily.    cephALEXin (KEFLEX) 500 MG capsule Take 1 capsule (500 mg total) by mouth 2 (two) times daily.    Cyanocobalamin (VITAMIN B12 PO) Take by mouth.    Dulaglutide (TRULICITY) 1.77 LT/9.0ZE SOPN INJECT 0.5 ML(0.75 MG) SUBCUTANEOUSLY EVERY WEEK IN ABDOMEN, THIGH OR UPPER ARM ROTATING INJECTION SITE 11/04/2020: Per endocrinologist patient to take trulicity every other week.    hydrOXYzine (ATARAX/VISTARIL) 10 MG tablet Take 1 tablet (10 mg total) by mouth 3 (three) times daily as needed.    Lancets (ONETOUCH DELICA PLUS [id number]) MISC USE TO CHECK BLOOD SUGAR TWICE DAILY    levocetirizine (XYZAL) 5 MG tablet Take 1 tablet (5 mg total) by mouth every evening.    Magnesium 250 MG TABS Take 1 tablet (250 mg total) by mouth every evening. Take with dinner meal    Multiple Vitamins-Minerals (ZINC PO) Take 1 tablet by mouth daily.    ONETOUCH VERIO test strip USE TO TEST 2 TIMES DAILY  Probiotic Product (PROBIOTIC PO) Take 1 capsule by mouth daily.     rosuvastatin (CRESTOR) 20 MG tablet TAKE 1 TABLET(20 MG) BY MOUTH DAILY    Vitamin D, Ergocalciferol, (DRISDOL) 1.25 MG (50000 UNIT) CAPS capsule TAKE 1 CAPSULE BY MOUTH TWICE WEEKLY ON TUESDAYS AND FRIDAYS    No facility-administered encounter medications on  file as of 08/04/2021.    Pattricia Boss, [place] Pharmacist Assistant [phone]

## 2021-08-18 ENCOUNTER — Other Ambulatory Visit: Payer: Self-pay

## 2021-08-18 MED ORDER — MAGNESIUM 250 MG PO TABS: 1.0000 | ORAL_TABLET | Freq: Every evening | ORAL | 1 refills | Status: DC

## 2021-08-18 MED ORDER — ROSUVASTATIN CALCIUM 20 MG PO TABS: ORAL_TABLET | ORAL | 1 refills | Status: DC

## 2021-08-18 MED ORDER — ALLOPURINOL 300 MG PO TABS: ORAL_TABLET | ORAL | 1 refills | Status: DC

## 2021-08-18 MED ORDER — VITAMIN B12 100 MCG PO TABS: ORAL_TABLET | ORAL | 1 refills | Status: DC

## 2021-08-18 MED ORDER — ASPIRIN EC 81 MG PO TBEC: 81.0000 mg | DELAYED_RELEASE_TABLET | Freq: Every day | ORAL | 1 refills | Status: DC

## 2021-08-18 MED ORDER — PROBIOTIC 250 MG PO CAPS: ORAL_CAPSULE | ORAL | 1 refills | Status: DC

## 2021-08-18 MED ORDER — ONE-DAILY MULTI VITAMINS PO TABS: 1.0000 | ORAL_TABLET | Freq: Every day | ORAL | 1 refills | Status: DC

## 2021-08-25 ENCOUNTER — Ambulatory Visit: Payer: Medicare Other | Admitting: Allergy

## 2021-08-25 ENCOUNTER — Other Ambulatory Visit: Payer: Self-pay

## 2021-08-25 MED ORDER — ATENOLOL-CHLORTHALIDONE 50-25 MG PO TABS: ORAL_TABLET | ORAL | 0 refills | Status: DC

## 2021-08-25 MED ORDER — VITAMIN D (ERGOCALCIFEROL) 1.25 MG (50000 UNIT) PO CAPS: ORAL_CAPSULE | ORAL | 0 refills | Status: DC

## 2021-08-29 ENCOUNTER — Ambulatory Visit
Admission: RE | Admit: 2021-08-29 | Discharge: 2021-08-29 | Disposition: A | Discharge status: Home / Self Care | Payer: Medicare Other | Source: Ambulatory Visit | Attending: Internal Medicine | Admitting: Internal Medicine

## 2021-08-29 ENCOUNTER — Other Ambulatory Visit: Payer: Self-pay

## 2021-08-29 DIAGNOSIS — Z1231 Encounter for screening mammogram for malignant neoplasm of breast: Secondary | ICD-10-CM | POA: Diagnosis not present

## 2021-08-29 DIAGNOSIS — Z0000 Encounter for general adult medical examination without abnormal findings: Secondary | ICD-10-CM

## 2021-08-29 IMAGING — MG MM DIGITAL SCREENING BILAT W/ TOMO AND CAD
6 of 10 series · 6 of 30 positions shown · non-contrast
Comparison: Previous exam(s).

CLINICAL DATA: Screening.

EXAM:
DIGITAL SCREENING BILATERAL MAMMOGRAM WITH TOMOSYNTHESIS AND CAD
TECHNIQUE: Bilateral screening digital craniocaudal and mediolateral oblique
mammograms were obtained. Bilateral screening digital breast
tomosynthesis was performed. The images were evaluated with
computer-aided detection.

[L CC synth-2D]
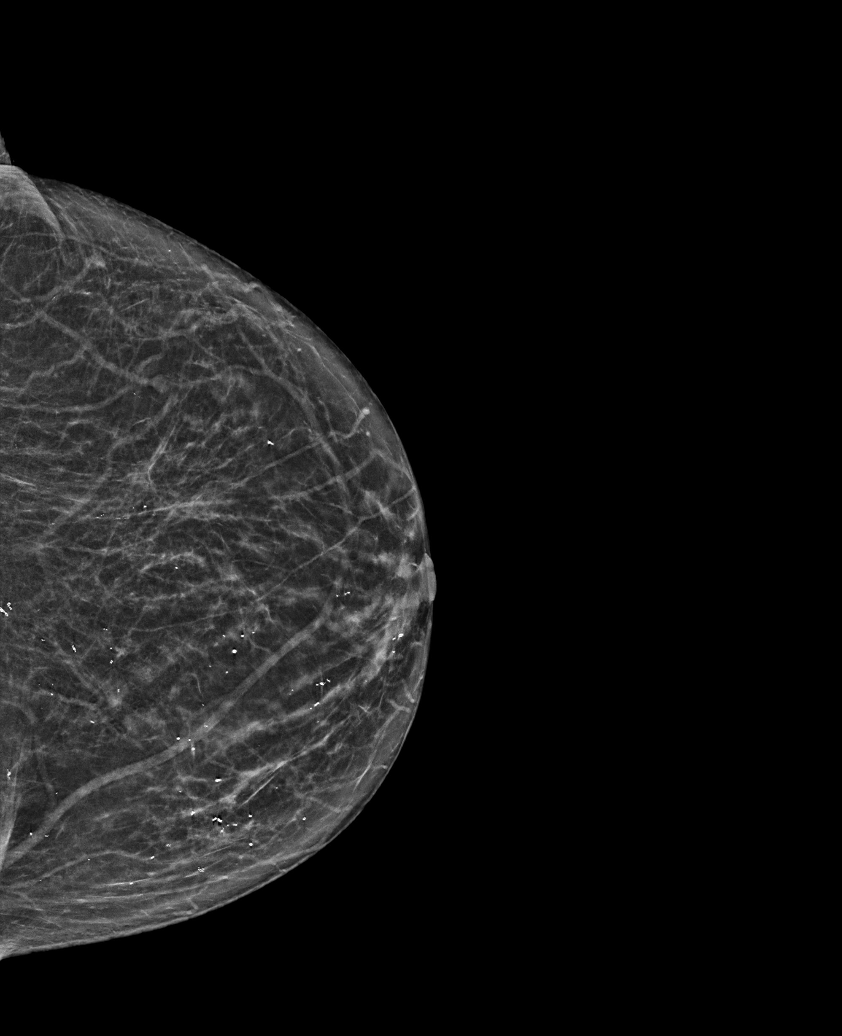

[R MLO synth-2D (1 of 2)]
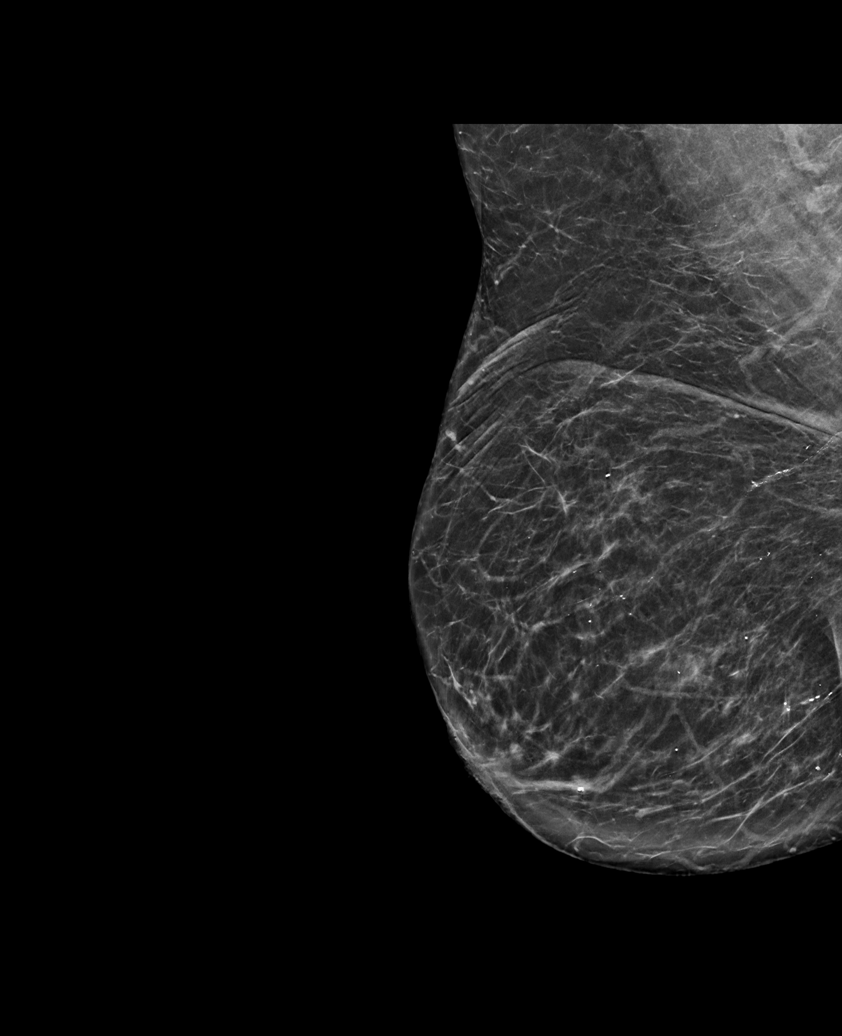

[R MLO synth-2D (2 of 2)]
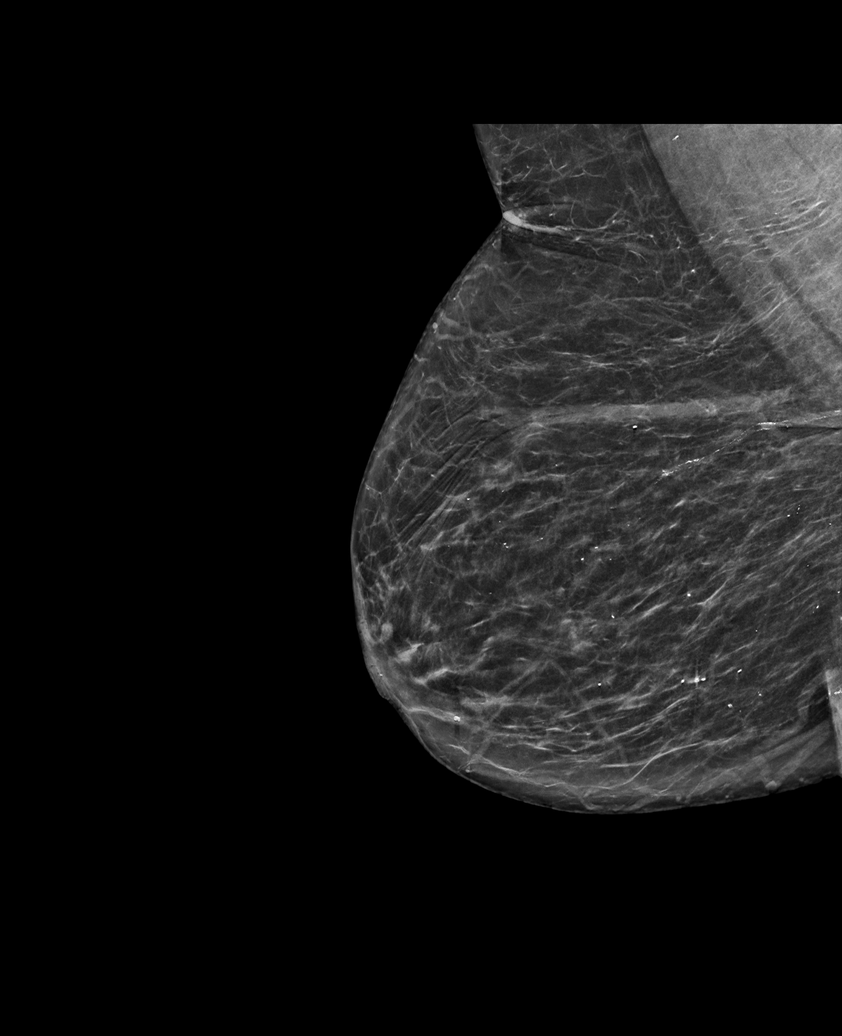

[R CC synth-2D]
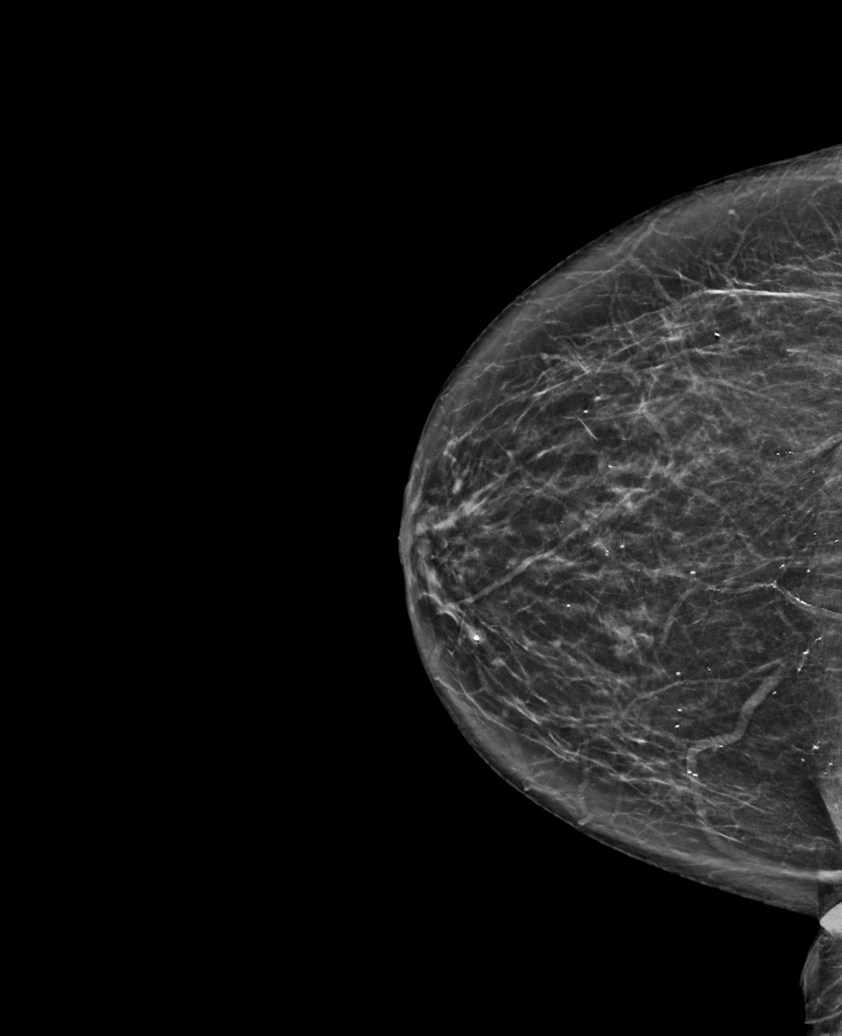

[L MLO synth-2D]
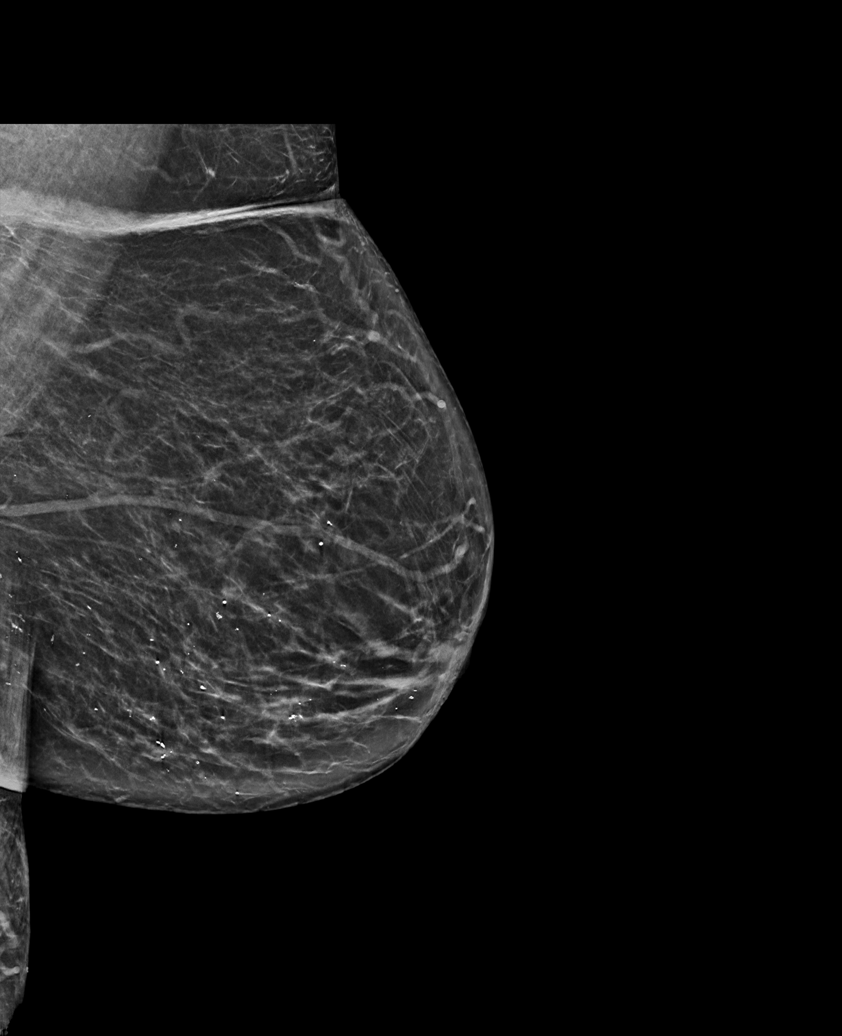

[R CC tomo · tomo slice 32/63.0]
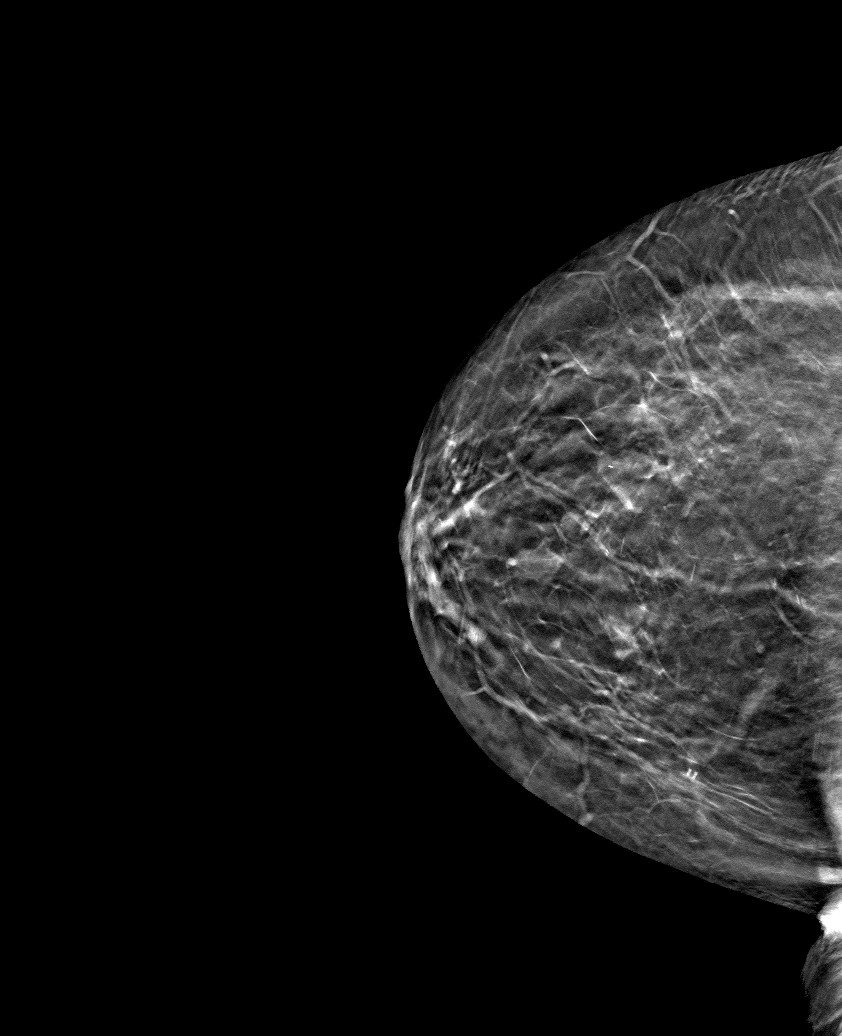

[6 of 30 positions shown; findings below may reference images not displayed]

ACR Breast Density Category b: There are scattered areas of
fibroglandular density.
FINDINGS: There are no findings suspicious for malignancy.
IMPRESSION: No mammographic evidence of malignancy. A result letter of this
screening mammogram will be mailed directly to the patient.

RECOMMENDATION:
Screening mammogram in one year. (Code:[BY])

BI-RADS CATEGORY  1: Negative.

## 2021-09-01 DIAGNOSIS — Z20822 Contact with and (suspected) exposure to covid-19: Secondary | ICD-10-CM | POA: Diagnosis not present

## 2021-09-05 ENCOUNTER — Telehealth: Payer: Self-pay

## 2021-09-05 NOTE — Chronic Care Management (AMB) (Signed)
Chronic Care Management Pharmacy Assistant   Name: [name]  MRN: [id number] DOB: July 31, 1941   Reason for Encounter: Medication Review/ Medication coordination  Recent office visits:  None  Recent consult visits:  None  Hospital visits:  None in previous 6 months  Medications: Outpatient Encounter Medications as of 09/05/2021  Medication Sig Note   allopurinol (ZYLOPRIM) 300 MG tablet Take 1 tablet by mouth daily    aspirin EC 81 MG tablet Take 1 tablet (81 mg total) by mouth daily.    atenolol-chlorthalidone (TENORETIC) 50-25 MG tablet TAKE 1 TABLET BY MOUTH DAILY    benazepril (LOTENSIN) 10 MG tablet Take 10 mg by mouth daily.    cephALEXin (KEFLEX) 500 MG capsule Take 1 capsule (500 mg total) by mouth 2 (two) times daily.    Dulaglutide (TRULICITY) 7.84 ON/6.2XB SOPN INJECT 0.5 ML(0.75 MG) SUBCUTANEOUSLY EVERY WEEK IN ABDOMEN, THIGH OR UPPER ARM ROTATING INJECTION SITE 11/04/2020: Per endocrinologist patient to take trulicity every other week.    hydrOXYzine (ATARAX/VISTARIL) 10 MG tablet Take 1 tablet (10 mg total) by mouth 3 (three) times daily as needed.    Lancets (ONETOUCH DELICA PLUS [id number]) MISC USE TO CHECK BLOOD SUGAR TWICE DAILY    levocetirizine (XYZAL) 5 MG tablet Take 1 tablet (5 mg total) by mouth every evening.    Magnesium 250 MG TABS Take 1 tablet (250 mg total) by mouth every evening. Take with dinner meal    Multiple Vitamin (MULTIVITAMIN) tablet Take 1 tablet by mouth daily.    Multiple Vitamins-Minerals (ZINC PO) Take 1 tablet by mouth daily.    ONETOUCH VERIO test strip USE TO TEST 2 TIMES DAILY    rosuvastatin (CRESTOR) 20 MG tablet TAKE 1 TABLET(20 MG) BY MOUTH DAILY    Saccharomyces boulardii (PROBIOTIC) 250 MG CAPS 1 tablet by mouth daily    vitamin B-12 (CYANOCOBALAMIN) 100 MCG tablet Take 1 tablet by mouth daily    Vitamin D, Ergocalciferol, (DRISDOL) 1.25 MG (50000 UNIT) CAPS capsule TAKE 1 CAPSULE BY MOUTH TWICE WEEKLY ON TUESDAYS AND  FRIDAYS    No facility-administered encounter medications on file as of 09/05/2021.   Reviewed chart for medication changes ahead of medication coordination call.  No OVs, Consults, or hospital visits since last care coordination call/Pharmacist visit. (If appropriate, list visit date, provider name)  No medication changes indicated OR if recent visit, treatment plan here.  BP Readings from Last 3 Encounters:  07/12/21 132/70  04/12/21 (!) 150/78  02/24/21 (!) 160/70    Lab Results  Component Value Date   HGBA1C 6.7 (H) 07/12/2021     Patient obtains medications through Vials  90 Days   Last adherence delivery included: None  Patient declined (meds) last month: None  Patient is due for next adherence delivery on: 09-15-2021 Called patient and reviewed medications and coordinated delivery.  This delivery to include: Vitamin D 1.25 mg- 1 tablet twice a week on Tuesdays and Fridays Allopurinol 300 mg- 1 tablet daily Atenolol-Chlorthalidone 50/25 mg- 1 tablet daily Rosuvastatin 20 mg- 1 tablet daily Benazepril 10 mg- 1 tablet daily Asa 81 mg- 1 tablet daily Vitamin B complex - 1 tablet daily Probiotic daily- 1 tablet daily Vitamin B12- 1 tablet daily Magnesium 250 mg- 1 tablet daily  Multivitamin - 1 tablet daily  No acute/short fills needed  Patient declined the following medications: None  Patient needs refills for: None  Confirmed delivery date of 09-15-2021 advised patient that pharmacy will contact them the morning  of delivery.  Care Gaps: Shingrix overdue Covid booster overdue PNA Vac overdue  Star Rating Drugs: Rosuvastatin 20 mg- Last filled 08-24-2021 [facility] Trulicity 1.44 mg- Patient Assistance Benazepril 10 mg- Last filled 08-24-2021 [facility]  [place] Clinical Pharmacist Assistant [phone]

## 2021-09-06 ENCOUNTER — Other Ambulatory Visit: Payer: Self-pay | Admitting: Internal Medicine

## 2021-09-07 ENCOUNTER — Other Ambulatory Visit: Payer: Self-pay | Admitting: Nurse Practitioner

## 2021-09-07 ENCOUNTER — Other Ambulatory Visit: Payer: Self-pay

## 2021-09-07 ENCOUNTER — Encounter: Payer: Self-pay | Admitting: Nurse Practitioner

## 2021-09-07 ENCOUNTER — Telehealth (INDEPENDENT_AMBULATORY_CARE_PROVIDER_SITE_OTHER): Payer: Medicare Other | Admitting: Nurse Practitioner

## 2021-09-07 VITALS — Ht 66.8 in

## 2021-09-07 DIAGNOSIS — R051 Acute cough: Secondary | ICD-10-CM

## 2021-09-07 MED ORDER — BENZONATATE 100 MG PO CAPS
100.0000 mg | ORAL_CAPSULE | Freq: Three times a day (TID) | ORAL | 1 refills | Status: DC | PRN
Start: 2021-09-07 — End: 2021-09-14

## 2021-09-07 NOTE — Progress Notes (Signed)
Virtual Visit via Telephone   This visit type was conducted due to national recommendations for restrictions regarding the COVID-19 Pandemic (e.g. social distancing) in an effort to limit this patient's exposure and mitigate transmission in our community.  Due to her co-morbid illnesses, this patient is at least at moderate risk for complications without adequate follow up.  This format is felt to be most appropriate for this patient at this time.  All issues noted in this document were discussed and addressed.  A limited physical exam was performed with this format.    This visit type was conducted due to national recommendations for restrictions regarding the COVID-19 Pandemic (e.g. social distancing) in an effort to limit this patient's exposure and mitigate transmission in our community.  Patients identity confirmed using two different identifiers.  This format is felt to be most appropriate for this patient at this time.  All issues noted in this document were discussed and addressed.  No physical exam was performed (except for noted visual exam findings with Video Visits).    Date:  10/10/2021   ID:  [name], DOB 09/17/1941, MRN [id number]  Patient Location:  Home - spoke with [name]  Provider location:   Office    Chief Complaint:  cough  History of Present Illness:    [name] is a 80 y.o. female who presents via telephone conferencing for a telehealth visit today.    The patient does have symptoms concerning for COVID-19 infection (fever, chills, cough, or new shortness of breath).   While doing a virtual visit with her husband he reports she has been coughing significantly. Telephone visit done to assess. She has been coughing for approximately 3-4 days with today she feels like she is getting better. Minimal secretions excreted. Denies fever.     Past Medical History:  Diagnosis Date   Abdominal pain    Arthritis    cervical disc degeneration/ oa  left knee, carpal tunnel rt wrist, adhesive capsulitis right shoulder, rt hand weakness; lumbar degeneration   Carpal tunnel syndrome    Complication of anesthesia 2006-at [facility]   breathing problems-no BP med given prior to surgery;  hx of being very sleepy after colon surgery --  states no problems with last right total knee replacement 2013   Constipation    Diabetes mellitus without complication ([place])    borderline - diet control   Diverticulitis hx of   Diverticulosis    E. coli infection    2020   Frequent UTI    hx of urethral injury during colon surgery - states frequent uti's since   GERD (gastroesophageal reflux disease)    H/O hiatal hernia    History of palpitations    in the past   History of shingles    has a lingering itching on back where shingles were   Hyperlipidemia    Hypertension    Numbness and tingling in right hand    pt. states has numbness of right hand very frequently-watch positioning   Obesity    Osteoarthritis    Osteoporosis    Pain    pain left knee and pain right hip and right groin   Personal history of colonic polyps-adenoma 08/26/2008   Pneumonia 2005   Shortness of breath 09/14/2021   Vitamin D deficiency    Past Surgical History:  Procedure Laterality Date   ABDOMINAL HYSTERECTOMY     bladder tack     BREAST BIOPSY Left  BREAST SURGERY     breast duct resection- benign   CARPAL TUNNEL RELEASE Right 06/04/2014   Procedure: RIGHT CARPAL TUNNEL RELEASE;  Surgeon: Roseanne Kaufman, MD;  Location: [facility];  Service: Orthopedics;  Laterality: Right;   COLON RESECTION  2008   hx diverticulosis   COLON SURGERY     COLONOSCOPY     colonoscopy [id number]     JOINT REPLACEMENT     OOPHORECTOMY     POLYPECTOMY     skin graft left arm - traumatic compression injury left upper arm     temporary ureter stent     TOTAL KNEE ARTHROPLASTY  01/05/2012   Procedure: TOTAL KNEE ARTHROPLASTY;  Surgeon: [name], MD;   Location: [facility];  Service: Orthopedics;  Laterality: Right;   TOTAL KNEE ARTHROPLASTY Left 08/17/2014   Procedure: LEFT TOTAL KNEE ARTHROPLASTY;  Surgeon: [name], MD;  Location: [facility];  Service: Orthopedics;  Laterality: Left;   ureter repair for tyransected left ureter       Current Meds  Medication Sig   allopurinol (ZYLOPRIM) 300 MG tablet Take 1 tablet by mouth daily   aspirin EC 81 MG tablet Take 1 tablet (81 mg total) by mouth daily.   Magnesium 250 MG TABS Take 1 tablet (250 mg total) by mouth every evening. Take with dinner meal   Multiple Vitamin (MULTIVITAMIN) tablet Take 1 tablet by mouth daily.   Multiple Vitamins-Minerals (ZINC PO) Take 1 tablet by mouth as needed.   ONETOUCH VERIO test strip USE TO TEST 2 TIMES DAILY   rosuvastatin (CRESTOR) 20 MG tablet TAKE 1 TABLET(20 MG) BY MOUTH DAILY   Saccharomyces boulardii (PROBIOTIC) 250 MG CAPS 1 tablet by mouth daily   vitamin B-12 (CYANOCOBALAMIN) 100 MCG tablet Take 1 tablet by mouth daily   Vitamin D, Ergocalciferol, (DRISDOL) 1.25 MG (50000 UNIT) CAPS capsule TAKE 1 CAPSULE BY MOUTH TWICE WEEKLY ON TUESDAYS AND FRIDAYS   [DISCONTINUED] atenolol-chlorthalidone (TENORETIC) 50-25 MG tablet TAKE 1 TABLET BY MOUTH DAILY   [DISCONTINUED] benazepril (LOTENSIN) 10 MG tablet Take 10 mg by mouth daily.   [DISCONTINUED] Dulaglutide (TRULICITY) 8.41 YS/0.6TK SOPN INJECT 0.5 ML(0.75 MG) SUBCUTANEOUSLY EVERY WEEK IN ABDOMEN, THIGH OR UPPER ARM ROTATING INJECTION SITE     Allergies:   Atorvastatin calcium, Bee venom, Oxycodone, and Vicodin [hydrocodone-acetaminophen]   Social History   Tobacco Use   Smoking status: Never   Smokeless tobacco: Never  Vaping Use   Vaping Use: Never used  Substance Use Topics   Alcohol use: No   Drug use: No     Family Hx: The patient's family history includes Arthritis in her sister and sister; Breast cancer (age of onset: 49) in her mother; COPD in her brother; Cancer in her maternal  grandmother; Dementia in her sister; Hypertension in her brother, child, child, child, father, mother, and sister; Lung cancer in her brother; Prostate cancer in her father. There is no history of Colon polyps, Esophageal cancer, Rectal cancer, Stomach cancer, or Colon cancer.  ROS:   Please see the history of present illness.    Review of Systems  Constitutional:  Negative for chills, fever and malaise/fatigue.  Eyes: Negative.   Respiratory:  Positive for cough. Negative for sputum production, shortness of breath and wheezing.   Cardiovascular: Negative.   Neurological:  Negative for dizziness and headaches.  Psychiatric/Behavioral: Negative.     All other systems reviewed and are negative.   Labs/Other Tests and Data Reviewed:  Recent Labs: 07/12/2021: ALT 25; BUN 25; Creatinine, Ser 1.13; Hemoglobin CANCELED; Platelets CANCELED; Potassium 4.6; Sodium 141   Recent Lipid Panel Lab Results  Component Value Date/Time   CHOL 170 06/01/2020 04:09 PM   TRIG 167 (H) 06/01/2020 04:09 PM   HDL 38 (L) 06/01/2020 04:09 PM   CHOLHDL 4.5 (H) 06/01/2020 04:09 PM   CHOLHDL 7 06/09/2014 09:23 AM   LDLCALC 103 (H) 06/01/2020 04:09 PM   LDLDIRECT 162.2 11/21/2013 11:35 AM    Wt Readings from Last 3 Encounters:  09/14/21 237 lb (107.5 kg)  07/12/21 236 lb 3.2 oz (107.1 kg)  05/19/21 235 lb (106.6 kg)     Exam:    Vital Signs:  Ht 5' 6.8" (1.697 m)   BMI 37.22 kg/m     Physical Exam Vitals reviewed.  Constitutional:      General: She is not in acute distress.    Appearance: Normal appearance.  Pulmonary:     Effort: Pulmonary effort is normal. No respiratory distress.  Neurological:     General: No focal deficit present.     Mental Status: She is alert and oriented to person, place, and time. Mental status is at baseline.     Cranial Nerves: No cranial nerve deficit.  Psychiatric:        Mood and Affect: Mood and affect normal.        Behavior: Behavior normal.         Thought Content: Thought content normal.        Cognition and Memory: Memory normal.        Judgment: Judgment normal.    ASSESSMENT & PLAN:    1. Acute cough She is getting better, encouraged to take over the counter Delsym, stay well hydrated with water and if symptoms worsen return call to office.   COVID-19 Education: The signs and symptoms of COVID-19 were discussed with the patient and how to seek care for testing (follow up with PCP or arrange E-visit).  The importance of social distancing was discussed today.  Patient Risk:   After full review of this patients clinical status, I feel that they are at least moderate risk at this time.  Time:   Today, I have spent 9 minutes/ seconds with the patient with telehealth technology discussing above diagnoses.     Medication Adjustments/Labs and Tests Ordered: Current medicines are reviewed at length with the patient today.  Concerns regarding medicines are outlined above.   Tests Ordered: No orders of the defined types were placed in this encounter.   Medication Changes: No orders of the defined types were placed in this encounter.   Disposition:  Follow up prn  Signed, [name], FNP

## 2021-09-09 ENCOUNTER — Telehealth: Payer: Self-pay

## 2021-09-09 NOTE — Chronic Care Management (AMB) (Signed)
Chronic Care Management Pharmacy Assistant   Name: [name]  MRN: [id number] DOB: 01/08/1941  Reason for Encounter: Medication Review   09/09/2021- Called patient to discuss packaging times for medication delivery on 09/15/2021. Patient decided to try packs out due to having a hard time opening vial bottles. Patient states with her arthritis it was difficult to open her vials and had to ask for help. Explained to patient that [facility] does offer easy open lids also and we can change vials that way. Patient was ok with easy open lids but then had questions regarding the packs, she is interested but not sure if she will understand the system and how her medication will be placed. Patient was cautious due to one of her medications being a different color when last received. Patient was referring to the Benazepril 10 mg, patient aware the medication was the same and that it was just a different manufacturer. Explained to patient that each pharmacy orders their medications from different suppliers but the medications are the same class, same strength, no difference. Patient verbally understands. Explained to patient how the Adherence packages look, how medications will be packed and what the packs will say on days and times she should take. Patient was still not sure and confused. Recommended patient continue with vials and when she sees Dr. Baird Cancer on Nov 03, 2021 we can show her an example of [facility] packaging system. Patient agreed but then decided she would like to try using the packs. Recommended we did 30 days to see how comfortable she is with packs instead of vials. Reviewed all medications that are being delivered and obtained times taken.    This delivery to include: Vitamin D 1.25 mg- 1 tablet twice a week on Tuesdays and Fridays (Breakfast) Allopurinol 300 mg- 1 tablet daily (Breakfast) Atenolol-Chlorthalidone 50/25 mg- 1 tablet daily (Breakfast) Rosuvastatin 20 mg- 1  tablet daily (Breakfast) Benazepril 10 mg- 1 tablet daily (Breakfast) Asa 81 mg- 1 tablet daily (Breakfast) Vitamin B complex - 1 tablet daily (Evening meal) Probiotic daily- 1 tablet daily (Breakfast) Vitamin B12- 1 tablet daily (Evening meal) Magnesium 250 mg- 1 tablet daily  (Evening meal) Multivitamin - 1 tablet daily (Evening meal)   No acute/short fills needed   Patient declined the following medications: None   Patient needs refills for: None   Confirmed delivery date of 09-15-2021 advised patient that pharmacy will contact them the morning of delivery.  Patient was grateful for the help, currently she and her husband are sick. Husband is getting better but she just received some medication to help with a cough she has. Patient has no further questions or concerns.  Medications: Outpatient Encounter Medications as of 09/09/2021  Medication Sig Note   allopurinol (ZYLOPRIM) 300 MG tablet Take 1 tablet by mouth daily    aspirin EC 81 MG tablet Take 1 tablet (81 mg total) by mouth daily.    atenolol-chlorthalidone (TENORETIC) 50-25 MG tablet TAKE 1 TABLET BY MOUTH DAILY    benazepril (LOTENSIN) 10 MG tablet Take 10 mg by mouth daily.    benzonatate (TESSALON PERLES) 100 MG capsule Take 1 capsule (100 mg total) by mouth 3 (three) times daily as needed for cough. (Patient not taking: Reported on 09/07/2021)    Dulaglutide (TRULICITY) 7.82 NF/6.2ZH SOPN INJECT 0.5 ML(0.75 MG) SUBCUTANEOUSLY EVERY WEEK IN ABDOMEN, THIGH OR UPPER ARM ROTATING INJECTION SITE 11/04/2020: Per endocrinologist patient to take trulicity every other week.    hydrOXYzine (ATARAX/VISTARIL) 10  MG tablet Take 1 tablet (10 mg total) by mouth 3 (three) times daily as needed. (Patient not taking: Reported on 09/07/2021)    Lancets (ONETOUCH DELICA PLUS [id number]) MISC USE TO CHECK BLOOD SUGAR TWICE DAILY    levocetirizine (XYZAL) 5 MG tablet Take 1 tablet (5 mg total) by mouth every evening. (Patient not taking: Reported  on 09/07/2021)    Magnesium 250 MG TABS Take 1 tablet (250 mg total) by mouth every evening. Take with dinner meal    Multiple Vitamin (MULTIVITAMIN) tablet Take 1 tablet by mouth daily.    Multiple Vitamins-Minerals (ZINC PO) Take 1 tablet by mouth daily.    ONETOUCH VERIO test strip USE TO TEST 2 TIMES DAILY    rosuvastatin (CRESTOR) 20 MG tablet TAKE 1 TABLET(20 MG) BY MOUTH DAILY    Saccharomyces boulardii (PROBIOTIC) 250 MG CAPS 1 tablet by mouth daily    vitamin B-12 (CYANOCOBALAMIN) 100 MCG tablet Take 1 tablet by mouth daily    Vitamin D, Ergocalciferol, (DRISDOL) 1.25 MG (50000 UNIT) CAPS capsule TAKE 1 CAPSULE BY MOUTH TWICE WEEKLY ON TUESDAYS AND FRIDAYS    No facility-administered encounter medications on file as of 09/09/2021.   Pattricia Boss, [place] Pharmacist Assistant [phone]

## 2021-09-09 NOTE — Chronic Care Management (AMB) (Signed)
    Chronic Care Management Pharmacy Assistant   Name: [name]  MRN: [id number] DOB: 11-Nov-1940   Reason for Encounter: 2023 PAP   Medications: Outpatient Encounter Medications as of 09/09/2021  Medication Sig Note   allopurinol (ZYLOPRIM) 300 MG tablet Take 1 tablet by mouth daily    aspirin EC 81 MG tablet Take 1 tablet (81 mg total) by mouth daily.    atenolol-chlorthalidone (TENORETIC) 50-25 MG tablet TAKE 1 TABLET BY MOUTH DAILY    benazepril (LOTENSIN) 10 MG tablet Take 10 mg by mouth daily.    benzonatate (TESSALON PERLES) 100 MG capsule Take 1 capsule (100 mg total) by mouth 3 (three) times daily as needed for cough. (Patient not taking: Reported on 09/07/2021)    Dulaglutide (TRULICITY) 3.12 OF/1.8AQ SOPN INJECT 0.5 ML(0.75 MG) SUBCUTANEOUSLY EVERY WEEK IN ABDOMEN, THIGH OR UPPER ARM ROTATING INJECTION SITE 11/04/2020: Per endocrinologist patient to take trulicity every other week.    hydrOXYzine (ATARAX/VISTARIL) 10 MG tablet Take 1 tablet (10 mg total) by mouth 3 (three) times daily as needed. (Patient not taking: Reported on 09/07/2021)    Lancets (ONETOUCH DELICA PLUS [id number]) MISC USE TO CHECK BLOOD SUGAR TWICE DAILY    levocetirizine (XYZAL) 5 MG tablet Take 1 tablet (5 mg total) by mouth every evening. (Patient not taking: Reported on 09/07/2021)    Magnesium 250 MG TABS Take 1 tablet (250 mg total) by mouth every evening. Take with dinner meal    Multiple Vitamin (MULTIVITAMIN) tablet Take 1 tablet by mouth daily.    Multiple Vitamins-Minerals (ZINC PO) Take 1 tablet by mouth daily.    ONETOUCH VERIO test strip USE TO TEST 2 TIMES DAILY    rosuvastatin (CRESTOR) 20 MG tablet TAKE 1 TABLET(20 MG) BY MOUTH DAILY    Saccharomyces boulardii (PROBIOTIC) 250 MG CAPS 1 tablet by mouth daily    vitamin B-12 (CYANOCOBALAMIN) 100 MCG tablet Take 1 tablet by mouth daily    Vitamin D, Ergocalciferol, (DRISDOL) 1.25 MG (50000 UNIT) CAPS capsule TAKE 1 CAPSULE BY MOUTH TWICE WEEKLY  ON TUESDAYS AND FRIDAYS    No facility-administered encounter medications on file as of 09/09/2021.   09-09-2021: Initiated 5947 application for trulicity. Informed patient that application will be mailed for her to sign and return to the office.  [place] Pharmacist Assistant [phone]

## 2021-09-14 ENCOUNTER — Encounter (HOSPITAL_BASED_OUTPATIENT_CLINIC_OR_DEPARTMENT_OTHER): Payer: Self-pay | Admitting: Cardiovascular Disease

## 2021-09-14 ENCOUNTER — Other Ambulatory Visit: Payer: Self-pay

## 2021-09-14 ENCOUNTER — Ambulatory Visit (HOSPITAL_BASED_OUTPATIENT_CLINIC_OR_DEPARTMENT_OTHER): Payer: Medicare Other | Admitting: Cardiovascular Disease

## 2021-09-14 VITALS — BP 134/68 | HR 62 | Ht 66.8 in | Wt 237.0 lb

## 2021-09-14 DIAGNOSIS — I6523 Occlusion and stenosis of bilateral carotid arteries: Secondary | ICD-10-CM | POA: Diagnosis not present

## 2021-09-14 DIAGNOSIS — E7800 Pure hypercholesterolemia, unspecified: Secondary | ICD-10-CM

## 2021-09-14 DIAGNOSIS — I10 Essential (primary) hypertension: Secondary | ICD-10-CM

## 2021-09-14 DIAGNOSIS — R0602 Shortness of breath: Secondary | ICD-10-CM | POA: Diagnosis not present

## 2021-09-14 DIAGNOSIS — I447 Left bundle-branch block, unspecified: Secondary | ICD-10-CM | POA: Diagnosis not present

## 2021-09-14 HISTORY — DX: Shortness of breath: R06.02

## 2021-09-14 MED ORDER — BLOOD PRESSURE KIT: 1.0000 [IU] | PACK | Freq: Every day | 0 refills | Status: DC

## 2021-09-14 MED ORDER — VALSARTAN 80 MG PO TABS: 80.0000 mg | ORAL_TABLET | Freq: Every day | ORAL | 3 refills | Status: DC

## 2021-09-14 MED ORDER — BLOOD PRESSURE KIT
1.0000 [IU] | PACK | Freq: Every day | 0 refills | Status: AC
Start: 2021-09-14 — End: ?

## 2021-09-14 NOTE — Patient Instructions (Addendum)
Medication Instructions:  STOP BENAZEPRIL   START VALSARTAN 80 MG DAILY   *If you need a refill on your cardiac medications before your next appointment, please call your pharmacy*  Lab Work: NONE   Testing/Procedures: Your physician has requested that you have a carotid duplex. This test is an ultrasound of the carotid arteries in your neck. It looks at blood flow through these arteries that supply the brain with blood. Allow one hour for this exam. There are no restrictions or special instructions.  Follow-Up: At [facility], you and your health needs are our priority.  As part of our continuing mission to provide you with exceptional heart care, we have created designated Provider Care Teams.  These Care Teams include your primary Cardiologist (physician) and Advanced Practice Providers (APPs -  Physician Assistants and Nurse Practitioners) who all work together to provide you with the care you need, when you need it.  We recommend signing up for the patient portal called "MyChart".  Sign up information is provided on this After Visit Summary.  MyChart is used to connect with patients for Virtual Visits (Telemedicine).  Patients are able to view lab/test results, encounter notes, upcoming appointments, etc.  Non-urgent messages can be sent to your provider as well.   To learn more about what you can do with MyChart, go to [email or web address].    Your next appointment:   12 month(s)  The format for your next appointment:   In Person  Provider:   Skeet Latch, MD   Other Instructions  Consider getting an Omron cuff

## 2021-09-14 NOTE — Progress Notes (Signed)
Cardiology Office Note:    Date:  09/14/2021   ID:  [name], DOB [birth year], MRN [id number]  PCP:  [name], MD   [place] Providers Cardiologist:  None     Referring MD: [name], MD   No chief complaint on file.   History of Present Illness:    [name] is a 80 y.o. female with a hx of carotid stenosis, coronary calcification, hypertension, hyperlipidemia, diabetes mellitus, obesity, GERD, arthritis, carpal tunnel, frequent UTI, and pneumonia here for the evaluation of shortness of breath. She saw [name], FNP via virtual visit and reported cough and shortness of breath. Prior to that she saw Dr. Baird Cancer 06/2021 and reported shortness of breath so she was referred to cardiology. She was previously seen in cardiology 07/2017 for evaluation of coronary calcifications. She was asymptomatic at the time but was very sedentary. She had a nuclear stress test 07/2017 with LVEF 45-54% and no ischemia.  Today, she is accompanied by a family member. At her recent visit with Dr. Baird Cancer, she initially believed her cough and shortness of breath may have been caused by a mild infection of Covid. Three weeks ago her husband was infected with Covid, and they also discovered having mold in her house. Since the mold was removed, her breathing issues and cough seem to have resolved. Tessalon pearls also had helped her cough up phlegm. However, this morning she noticed her lips were swollen and chapped. At home, she has tried multiple blood pressure cuffs but still obtains inaccurate readings 10-15 points off from readings at her PCP visits. Lately she has not been exercising much. For most of the day she is sitting at home for work, and does not have much time for walking. About 4 years ago she weighed 325 lbs, and today she is 237 lbs. Of note, when she takes her diabetes medication she has not seen her sugars higher than 127. For her diet she will often cook at home and  avoid fried foods. Occasionally she will order out when there is no time to cook. She denies any palpitations, or chest pain. No lightheadedness, headaches, syncope, orthopnea, PND, lower extremity edema or exertional symptoms.   Past Medical History:  Diagnosis Date   Abdominal pain    Arthritis    cervical disc degeneration/ oa left knee, carpal tunnel rt wrist, adhesive capsulitis right shoulder, rt hand weakness; lumbar degeneration   Carpal tunnel syndrome    Complication of anesthesia 2006-at [facility]   breathing problems-no BP med given prior to surgery;  hx of being very sleepy after colon surgery --  states no problems with last right total knee replacement 2013   Constipation    Diabetes mellitus without complication (HCC)    borderline - diet control   Diverticulitis hx of   Diverticulosis    E. coli infection    2020   Frequent UTI    hx of urethral injury during colon surgery - states frequent uti's since   GERD (gastroesophageal reflux disease)    H/O hiatal hernia    History of palpitations    in the past   History of shingles    has a lingering itching on back where shingles were   Hyperlipidemia    Hypertension    Numbness and tingling in right hand    pt. states has numbness of right hand very frequently-watch positioning   Obesity    Osteoarthritis    Osteoporosis    Pain  pain left knee and pain right hip and right groin   Personal history of colonic polyps-adenoma 08/26/2008   Pneumonia 2005   Shortness of breath 09/14/2021   Vitamin D deficiency     Past Surgical History:  Procedure Laterality Date   ABDOMINAL HYSTERECTOMY     bladder tack     BREAST BIOPSY Left    BREAST SURGERY     breast duct resection- benign   CARPAL TUNNEL RELEASE Right 06/04/2014   Procedure: RIGHT CARPAL TUNNEL RELEASE;  Surgeon: Roseanne Kaufman, MD;  Location: [facility];  Service: Orthopedics;  Laterality: Right;   COLON RESECTION  2008   hx  diverticulosis   COLON SURGERY     COLONOSCOPY     colonoscopy [id number]     JOINT REPLACEMENT     OOPHORECTOMY     POLYPECTOMY     skin graft left arm - traumatic compression injury left upper arm     temporary ureter stent     TOTAL KNEE ARTHROPLASTY  01/05/2012   Procedure: TOTAL KNEE ARTHROPLASTY;  Surgeon: [name], MD;  Location: [facility];  Service: Orthopedics;  Laterality: Right;   TOTAL KNEE ARTHROPLASTY Left 08/17/2014   Procedure: LEFT TOTAL KNEE ARTHROPLASTY;  Surgeon: [name], MD;  Location: [facility];  Service: Orthopedics;  Laterality: Left;   ureter repair for tyransected left ureter      Current Medications: Current Meds  Medication Sig   allopurinol (ZYLOPRIM) 300 MG tablet Take 1 tablet by mouth daily   aspirin EC 81 MG tablet Take 1 tablet (81 mg total) by mouth daily.   atenolol-chlorthalidone (TENORETIC) 50-25 MG tablet TAKE 1 TABLET BY MOUTH DAILY   benazepril (LOTENSIN) 10 MG tablet Take 10 mg by mouth daily.   Dulaglutide (TRULICITY) 4.19 FX/9.0WI SOPN Inject 0.5 mg into the skin every 14 (fourteen) days.   Lancets (ONETOUCH DELICA PLUS [id number]) MISC USE TO CHECK BLOOD SUGAR TWICE DAILY   Magnesium 250 MG TABS Take 1 tablet (250 mg total) by mouth every evening. Take with dinner meal   Multiple Vitamin (MULTIVITAMIN) tablet Take 1 tablet by mouth daily.   Multiple Vitamins-Minerals (ZINC PO) Take 1 tablet by mouth as needed.   ONETOUCH VERIO test strip USE TO TEST 2 TIMES DAILY   rosuvastatin (CRESTOR) 20 MG tablet TAKE 1 TABLET(20 MG) BY MOUTH DAILY   Saccharomyces boulardii (PROBIOTIC) 250 MG CAPS 1 tablet by mouth daily   vitamin B-12 (CYANOCOBALAMIN) 100 MCG tablet Take 1 tablet by mouth daily   Vitamin D, Ergocalciferol, (DRISDOL) 1.25 MG (50000 UNIT) CAPS capsule TAKE 1 CAPSULE BY MOUTH TWICE WEEKLY ON TUESDAYS AND FRIDAYS   [DISCONTINUED] Dulaglutide (TRULICITY) 2.42 AS/3.4HD SOPN INJECT 0.5 ML(0.75 MG) SUBCUTANEOUSLY EVERY WEEK IN  ABDOMEN, THIGH OR UPPER ARM ROTATING INJECTION SITE     Allergies:   Atorvastatin calcium, Bee venom, Oxycodone, and Vicodin [hydrocodone-acetaminophen]   Social History   Socioeconomic History   Marital status: Married    Spouse name: Not on file   Number of children: 6   Years of education: Not on file   Highest education level: Not on file  Occupational History   Occupation: DESIGNER    Employer: [employer]  Tobacco Use   Smoking status: Never   Smokeless tobacco: Never  Vaping Use   Vaping Use: Never used  Substance and Sexual Activity   Alcohol use: No   Drug use: No   Sexual activity: Not Currently  Other Topics  Concern   Not on file  Social History Narrative   The patient is married and has 6 children   Operates a business   No alcohol tobacco or drug use   Social Determinants of Radio broadcast assistant Strain: Low Risk    Difficulty of Paying Living Expenses: Not hard at all  Food Insecurity: No Food Insecurity   Worried About Charity fundraiser in the Last Year: Never true   Arboriculturist in the Last Year: Never true  Transportation Needs: No Transportation Needs   Lack of Transportation (Medical): No   Lack of Transportation (Non-Medical): No  Physical Activity: Inactive   Days of Exercise per Week: 0 days   Minutes of Exercise per Session: 0 min  Stress: No Stress Concern Present   Feeling of Stress : Only a little  Social Connections: Not on file     Family History: The patient's family history includes Arthritis in her sister and sister; Breast cancer (age of onset: 55) in her mother; COPD in her brother; Cancer in her maternal grandmother; Dementia in her sister; Hypertension in her brother, child, child, child, father, mother, and sister; Lung cancer in her brother; Prostate cancer in her father. There is no history of Colon polyps, Esophageal cancer, Rectal cancer, Stomach cancer, or Colon cancer.  ROS:   Please see the history of  present illness.    (+) Edema in lips All other systems reviewed and are negative.  EKGs/Labs/Other Studies Reviewed:    The following studies were reviewed today:  Bilateral Carotid Duplex 08/14/2017: Final Interpretation:  Right Carotid: There is evidence in the right ICA of a 1-39% stenosis. Non-hemodynamically significant plaque <50% noted in the CCA. The ECA appears >50% stenosed. The RICA velocities remain within normal range and stable.   Left Carotid: There is evidence in the left ICA of a 1-39% stenosis. Non-hemodynamically significant plaque noted in the CCA. The LICA velocities remain within normal range and have increased compared to the prior exam. Unable to duplicate elevated velocities of 223 cm/s noted in the [facility] on prior exam.   Vertebrals:  Both vertebral arteries were patent with antegrade flow.   Subclavians: Normal flow hemodynamics were seen in bilateral subclavian arteries.   Nuclear Stress Test 08/10/2017: Nuclear stress EF: 50%. The left ventricular ejection fraction is mildly decreased (45-54%). There was no ST segment deviation noted during stress. No T wave inversion was noted during stress. The study is normal. This is a low risk study.   Low risk stress nuclear study with normal perfusion and lower limit of normal left ventricular  global systolic function.  EKG:    09/14/2021: Sinus rhythm. Rate 62 bpm. LBBB.  Recent Labs: 07/12/2021: ALT 25; BUN 25; Creatinine, Ser 1.13; Hemoglobin CANCELED; Platelets CANCELED; Potassium 4.6; Sodium 141   Recent Lipid Panel    Component Value Date/Time   CHOL 170 06/01/2020 1609   TRIG 167 (H) 06/01/2020 1609   HDL 38 (L) 06/01/2020 1609   CHOLHDL 4.5 (H) 06/01/2020 1609   CHOLHDL 7 06/09/2014 0923   VLDL 30.6 06/09/2014 0923   LDLCALC 103 (H) 06/01/2020 1609   LDLDIRECT 162.2 11/21/2013 1135        Physical Exam:    Wt Readings from Last 3 Encounters:  09/14/21 237 lb (107.5 kg)  07/12/21 236 lb 3.2  oz (107.1 kg)  05/19/21 235 lb (106.6 kg)     VS:  BP 134/68 (BP Location: Right Arm, Patient  Position: Sitting, Cuff Size: Large)   Pulse 62   Ht 5' 6.8" (1.697 m)   Wt 237 lb (107.5 kg)   BMI 37.34 kg/m  , BMI Body mass index is 37.34 kg/m. GENERAL:  Well appearing HEENT: Pupils equal round and reactive, fundi not visualized, oral mucosa unremarkable NECK:  No jugular venous distention, waveform within normal limits, carotid upstroke brisk and symmetric, no bruits, no thyromegaly LUNGS:  Clear to auscultation bilaterally HEART:  RRR.  PMI not displaced or sustained,S1 and S2 within normal limits, no S3, no S4, no clicks, no rubs, no murmurs ABD:  Flat, positive bowel sounds normal in frequency in pitch, no bruits, no rebound, no guarding, no midline pulsatile mass, no hepatomegaly, no splenomegaly EXT:  2 plus pulses throughout, no edema, no cyanosis no clubbing SKIN:  No rashes no nodules NEURO:  Cranial nerves II through XII grossly intact, motor grossly intact throughout PSYCH:  Cognitively intact, oriented to person place and time   ASSESSMENT:    1. Essential hypertension   2. LBBB (left bundle branch block)   3. Shortness of breath    PLAN:    Essential hypertension BP is controlled.  She has been struggling with cough which may be related to mold and COVID-19 exposure.  Given that she is [place] American, we will stop the benazepril.  Switch to valsartan 80mg  daily.  Track BP at home.  Goal <130/80.  She is going to work on increasing her exercise. Continue atenolol and chlorthalidone.    LBBB (left bundle branch block) Chronic.  She has no anginal symptoms.    Disposition: FU with Fatime Biswell C. Oval Linsey, MD, [facility] in 1 year.  Medication Adjustments/Labs and Tests Ordered: Current medicines are reviewed at length with the patient today.  Concerns regarding medicines are outlined above.   No orders of the defined types were placed in this encounter.  No orders of the  defined types were placed in this encounter.  Patient Instructions  Consider getting an Omron cuff   I,Mathew Stumpf,acting as a scribe for Skeet Latch, MD.,have documented all relevant documentation on the behalf of Skeet Latch, MD,as directed by  Skeet Latch, MD while in the presence of Skeet Latch, MD.  I, [place] Oval Linsey, MD have reviewed all documentation for this visit.  The documentation of the exam, diagnosis, procedures, and orders on 09/14/2021 are all accurate and complete.   Waynetta Pean  09/14/2021 11:39 AM    [facility]

## 2021-09-14 NOTE — Assessment & Plan Note (Signed)
LDL goal less than 70 given that she has carotid disease and aortic atherosclerosis.  She is going to see Dr. Baird Cancer next month and will have lipids.

## 2021-09-14 NOTE — Assessment & Plan Note (Signed)
>>  ASSESSMENT AND PLAN FOR HYPERLIPIDEMIA WRITTEN ON 09/14/2021 11:41 AM BY Severance, TIFFANY, MD  LDL goal less than 70 given that she has carotid disease and aortic atherosclerosis.  She is going to see Dr. Allyne Gee next month and will have lipids.

## 2021-09-14 NOTE — Assessment & Plan Note (Signed)
Chronic.  She has no anginal symptoms.

## 2021-09-14 NOTE — Assessment & Plan Note (Addendum)
BP is controlled.  She has been struggling with cough which may be related to mold and COVID-19 exposure.  Given that she is [place] American, we will stop the benazepril.  Switch to valsartan 80mg  daily.  Track BP at home.  Goal <130/80.  She is going to work on increasing her exercise. Continue atenolol and chlorthalidone.

## 2021-09-14 NOTE — Assessment & Plan Note (Signed)
Symptoms improved with getting the mold out of her house.  She also had COVID-19 exposure, though she remained negative.  No further testing indicated at this time.

## 2021-09-26 ENCOUNTER — Other Ambulatory Visit: Payer: Self-pay | Admitting: Internal Medicine

## 2021-09-29 ENCOUNTER — Encounter (HOSPITAL_BASED_OUTPATIENT_CLINIC_OR_DEPARTMENT_OTHER): Payer: Medicare Other

## 2021-10-04 ENCOUNTER — Telehealth: Payer: Self-pay

## 2021-10-04 ENCOUNTER — Ambulatory Visit (INDEPENDENT_AMBULATORY_CARE_PROVIDER_SITE_OTHER): Payer: Medicare Other

## 2021-10-04 ENCOUNTER — Other Ambulatory Visit: Payer: Self-pay

## 2021-10-04 DIAGNOSIS — N1831 Chronic kidney disease, stage 3a: Secondary | ICD-10-CM

## 2021-10-04 DIAGNOSIS — I129 Hypertensive chronic kidney disease with stage 1 through stage 4 chronic kidney disease, or unspecified chronic kidney disease: Secondary | ICD-10-CM

## 2021-10-04 DIAGNOSIS — E1122 Type 2 diabetes mellitus with diabetic chronic kidney disease: Secondary | ICD-10-CM

## 2021-10-04 DIAGNOSIS — N183 Chronic kidney disease, stage 3 unspecified: Secondary | ICD-10-CM

## 2021-10-04 NOTE — Chronic Care Management (AMB) (Signed)
Chronic Care Management Pharmacy Assistant   Name: [name]  MRN: [id number] DOB: 1941-06-09   Reason for Encounter: Medication Review/ Medication coordination   Recent office visits:  None  Recent consult visits:  09-14-2021 Skeet Latch, MD (Cardiology). STOP Benazepril. START Valsartan 80 mg daily.  Hospital visits:  None in previous 6 months  Medications: Outpatient Encounter Medications as of 10/04/2021  Medication Sig   allopurinol (ZYLOPRIM) 300 MG tablet Take 1 tablet by mouth daily   aspirin EC 81 MG tablet Take 1 tablet (81 mg total) by mouth daily.   atenolol-chlorthalidone (TENORETIC) 50-25 MG tablet TAKE 1 TABLET BY MOUTH DAILY   Blood Pressure KIT 1 Units by Does not apply route daily. Check blood pressure daily for hypertension dx code I10   Dulaglutide (TRULICITY) 5.32 DJ/2.4QA SOPN Inject 0.5 mg into the skin every 14 (fourteen) days.   Lancets (ONETOUCH DELICA PLUS [id number]) MISC USE TO CHECK BLOOD SUGAR TWICE DAILY   Magnesium 250 MG TABS Take 1 tablet (250 mg total) by mouth every evening. Take with dinner meal   Multiple Vitamin (MULTIVITAMIN) tablet Take 1 tablet by mouth daily.   Multiple Vitamins-Minerals (ZINC PO) Take 1 tablet by mouth as needed.   ONETOUCH VERIO test strip USE TO TEST 2 TIMES DAILY   rosuvastatin (CRESTOR) 20 MG tablet TAKE 1 TABLET(20 MG) BY MOUTH DAILY   Saccharomyces boulardii (PROBIOTIC) 250 MG CAPS 1 tablet by mouth daily   valsartan (DIOVAN) 80 MG tablet Take 1 tablet (80 mg total) by mouth daily.   vitamin B-12 (CYANOCOBALAMIN) 100 MCG tablet Take 1 tablet by mouth daily   Vitamin D, Ergocalciferol, (DRISDOL) 1.25 MG (50000 UNIT) CAPS capsule TAKE 1 CAPSULE BY MOUTH TWICE WEEKLY ON TUESDAYS AND FRIDAYS   No facility-administered encounter medications on file as of 10/04/2021.  Reviewed chart for medication changes ahead of medication coordination call.  No OVs, Consults, or hospital visits since last care  coordination call/Pharmacist visit. (If appropriate, list visit date, provider name)  No medication changes indicated OR if recent visit, treatment plan here.  BP Readings from Last 3 Encounters:  09/14/21 134/68  07/12/21 132/70  04/12/21 (!) 150/78    Lab Results  Component Value Date   HGBA1C 6.7 (H) 07/12/2021     Patient obtains medications through Adherence Packaging  30 Days   Last adherence delivery included:  Vitamin D 1.25 mg- 1 tablet twice a week on Tuesdays and Fridays (Breakfast) Allopurinol 300 mg- 1 tablet daily (Breakfast) Atenolol-Chlorthalidone 50/25 mg- 1 tablet daily (Breakfast) Rosuvastatin 20 mg- 1 tablet daily (Breakfast) Benazepril 10 mg- 1 tablet daily (Breakfast) Asa 81 mg- 1 tablet daily (Breakfast) Vitamin B complex - 1 tablet daily (Evening meal) Probiotic daily- 1 tablet daily (Breakfast) Vitamin B12- 1 tablet daily (Evening meal) Magnesium 250 mg- 1 tablet daily  (Evening meal) Multivitamin - 1 tablet daily (Evening meal)     Patient declined (meds) last month: None  Patient is due for next adherence delivery on: 10-17-2021  Called patient and reviewed medications and coordinated delivery.  This delivery to include: Valsartan 80 mg at breakfast Multivitamin - 1 tablet daily (Evening meal)  Probiotic daily- 1 tablet daily (Breakfast) Vitamin B complex - 1 tablet daily (Evening meal) Magnesium 250 mg- 1 tablet daily  (Evening meal) Allopurinol 300 mg- 1 tablet daily (Breakfast) Atenolol-Chlorthalidone 50/25 mg- 1 tablet daily (Breakfast) Rosuvastatin 20 mg- 1 tablet daily (Breakfast) Asa 81 mg- 1 tablet daily (Breakfast) Vitamin D 1.25  mg- 1 tablet twice a week on Tuesdays and Fridays (Breakfast) Vitamin B12- 1 tablet daily (Evening meal)  No short/acute fill needed  Patient declined the following medications: None  Patient needs refills for: None  Confirmed delivery date of 10-17-2021 advised patient that pharmacy will contact  them the morning of delivery.   Care Gaps: PNA Vac overdue Shingrix overdue AWV 06-22-2022  Star Rating Drugs: Rosuvastatin 20 mg- Last filled 09-15-2021 [facility] Valsartan 80 mg- Last filled 09-15-2021 [facility] Trulicity 0.5 mg- Patient assistance  [facility] Pharmacist Assistant [phone]

## 2021-10-04 NOTE — Chronic Care Management (AMB) (Signed)
Chronic Care Management    Social Work Note  10/04/2021 Name: [name] MRN: [id number] DOB: [birth year]  [name] is a 80 y.o. year old female who is a primary care patient of [name], MD. The CCM team was consulted to assist the patient with chronic disease management and/or care coordination needs related to:  DM II, HTN, CKD III .   Engaged with patient by telephone for follow up visit in response to provider referral for social work chronic care management and care coordination services.   Consent to Services:  The patient was given information about Chronic Care Management services, agreed to services, and gave verbal consent prior to initiation of services.  Please see initial visit note for detailed documentation.   Patient agreed to services and consent obtained.   Assessment: Review of patient past medical history, allergies, medications, and health status, including review of relevant consultants reports was performed today as part of a comprehensive evaluation and provision of chronic care management and care coordination services.     SDOH (Social Determinants of Health) assessments and interventions performed:    Advanced Directives Status: Not addressed in this encounter.  CCM Care Plan  Allergies  Allergen Reactions   Atorvastatin Calcium Itching   Bee Venom Hives   Oxycodone Other (See Comments)    hallucinations   Vicodin [Hydrocodone-Acetaminophen] Other (See Comments)    hallucinations    Outpatient Encounter Medications as of 10/04/2021  Medication Sig   allopurinol (ZYLOPRIM) 300 MG tablet Take 1 tablet by mouth daily   aspirin EC 81 MG tablet Take 1 tablet (81 mg total) by mouth daily.   atenolol-chlorthalidone (TENORETIC) 50-25 MG tablet TAKE 1 TABLET BY MOUTH DAILY   Blood Pressure KIT 1 Units by Does not apply route daily. Check blood pressure daily for hypertension dx code I10   Dulaglutide (TRULICITY) 1.85 UD/1.4HF SOPN Inject 0.5  mg into the skin every 14 (fourteen) days.   Lancets (ONETOUCH DELICA PLUS [id number]) MISC USE TO CHECK BLOOD SUGAR TWICE DAILY   Magnesium 250 MG TABS Take 1 tablet (250 mg total) by mouth every evening. Take with dinner meal   Multiple Vitamin (MULTIVITAMIN) tablet Take 1 tablet by mouth daily.   Multiple Vitamins-Minerals (ZINC PO) Take 1 tablet by mouth as needed.   ONETOUCH VERIO test strip USE TO TEST 2 TIMES DAILY   rosuvastatin (CRESTOR) 20 MG tablet TAKE 1 TABLET(20 MG) BY MOUTH DAILY   Saccharomyces boulardii (PROBIOTIC) 250 MG CAPS 1 tablet by mouth daily   valsartan (DIOVAN) 80 MG tablet Take 1 tablet (80 mg total) by mouth daily.   vitamin B-12 (CYANOCOBALAMIN) 100 MCG tablet Take 1 tablet by mouth daily   Vitamin D, Ergocalciferol, (DRISDOL) 1.25 MG (50000 UNIT) CAPS capsule TAKE 1 CAPSULE BY MOUTH TWICE WEEKLY ON TUESDAYS AND FRIDAYS   No facility-administered encounter medications on file as of 10/04/2021.    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   Aortic atherosclerosis ([place]) 01/03/2021   Class 2 severe obesity due to excess calories with serious comorbidity and body mass index (BMI) of 39.0 to 39.9 in adult [facility]) 01/03/2021   Pruritus 01/03/2021   Vitamin D deficiency 06/01/2020   Abdominal bloating 02/17/2019   Urinary frequency 02/17/2019   Uncontrolled type 2 diabetes mellitus with hyperglycemia ([place]) 02/17/2019   Lightheaded 02/17/2019   Constipation  06/26/2018   Hyperlipidemia 08/23/2014   Type 2 diabetes mellitus with stage 3 chronic kidney disease, without long-term current use of insulin ([place]) 06/19/2014   Mixed hyperlipidemia 06/19/2014   S/P urological surgery 09/30/2013   Joint pain 09/30/2013   UTI (urinary tract infection) 09/30/2013   Cough 12/10/2012   OA (osteoarthritis) of knee 01/05/2012   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 08/02/2011   Vaginal discharge 08/02/2011   Spasm of lumbar paraspinous muscle 06/17/2011   Morbidly obese ([place]) 05/31/2011   HIP PAIN, LEFT, CHRONIC 11/09/2010   CARPAL TUNNEL SYNDROME, RIGHT 10/14/2010   KNEE PAIN, RIGHT, CHRONIC 10/14/2010   LBBB (left bundle branch block) 09/14/5207   HELICOBACTER PYLORI GASTRITIS 07/18/2010   FATIGUE 07/01/2010   CHEST PAIN, ATYPICAL 07/01/2010   EPIGASTRIC PAIN 07/01/2010   SUPRAPUBIC PAIN 07/01/2010   HYPERGLYCEMIA 07/01/2010   CELLULITIS AND ABSCESS OF OTHER SPECIFIED SITE 02/15/2010   LUMP OR MASS IN BREAST 01/10/2010   DYSURIA, CHRONIC 03/09/2009   GERD 10/06/2008   Personal history of colonic polyps-adenoma 08/26/2008   LOC OSTEOARTHROS NOT SPEC PRIM/SEC LOWER LEG 09/10/2007   GOUT, UNSPECIFIED 09/09/2007   DIVERTICULOSIS, COLON 09/09/2007   DIVERTICULITIS, HX OF 09/09/2007   HYPERLIPIDEMIA 06/03/2007   Essential hypertension 06/03/2007    Conditions to be addressed/monitored: HTN, DMII, and CKD Stage III  Care Plan : Social Work Care Plan  Updates made by [name] since 10/04/2021 12:00 AM  Completed 10/04/2021   Problem: Quality of Life (General Plan of Care) Resolved 10/04/2021     Long-Range Goal: Quality of Life Maintained Completed 10/04/2021  Start Date: 06/30/2021  Expected End Date: 10/28/2021  Priority: Low  Note:   Current Barriers:  Chronic disease management support and education needs related to HTN, DM, and CKD Stage III    Social Worker Clinical Goal(s):  patient will work with SW to identify and address any acute and/or chronic care coordination needs related to the self health management of HTN, DM, and CKD Stage III    SW Interventions:  Inter-disciplinary care team collaboration (see longitudinal plan of care) Collaboration with [name], MD regarding development and update of comprehensive plan of care as evidenced by provider attestation and co-signature Telephonic visit  completed with the patient to assist with care coordination needs Discussed the patient received a letter from [employer] to renew her Trulicity assistance - patient having difficulty completing form Provided the patient with contact number to [place] assistant to discuss how to complete and submit form Assessed for care coordination needs - no acute SW needs identified at this time  Patient Goals/Self-Care Activities patient will:   -  contact Malecca Hicks regarding Trulicity patient assistance -Contact SW as needed        Follow Up Plan:  No SW follow up planned at this time. The patient will remain engaged with RN Care Manager and PharmD to address care management needs.      [name], BSW, CDP Social Worker, Certified Dementia Practitioner [place] / [place] [facility] [phone]

## 2021-10-04 NOTE — Patient Instructions (Signed)
Social Worker Visit Information  Goals we discussed today:   Goals Addressed             This Visit's Progress    COMPLETED: Quality of Life Maintained       Timeframe:  Long-Range Goal Priority:  Low Start Date: 9.1.22                            Expected End Date: 12.30.22                      Patient Goals/Self-Care Activities patient will:   - contact Malecca Hicks with Trulicity patient assistance questions -Contact SW as needed           Patient verbalizes understanding of instructions provided today and agrees to view in [place].   Follow Up Plan:  No SW follow up planned at this time. Please contact me as needed.   [name], BSW, CDP Social Worker, Certified Dementia Practitioner [place] / [place] [facility] [phone]

## 2021-10-09 ENCOUNTER — Encounter: Payer: Self-pay | Admitting: Nurse Practitioner

## 2021-10-12 ENCOUNTER — Ambulatory Visit (HOSPITAL_COMMUNITY)
Admission: RE | Admit: 2021-10-12 | Discharge: 2021-10-12 | Disposition: A | Discharge status: Home / Self Care | Payer: Medicare Other | Source: Ambulatory Visit | Attending: Cardiovascular Disease | Admitting: Cardiovascular Disease

## 2021-10-12 ENCOUNTER — Other Ambulatory Visit: Payer: Self-pay

## 2021-10-12 DIAGNOSIS — I6523 Occlusion and stenosis of bilateral carotid arteries: Secondary | ICD-10-CM

## 2021-10-13 ENCOUNTER — Telehealth: Payer: Medicare Other

## 2021-10-13 ENCOUNTER — Ambulatory Visit: Payer: Self-pay

## 2021-10-13 DIAGNOSIS — E1122 Type 2 diabetes mellitus with diabetic chronic kidney disease: Secondary | ICD-10-CM

## 2021-10-13 DIAGNOSIS — N183 Chronic kidney disease, stage 3 unspecified: Secondary | ICD-10-CM

## 2021-10-13 DIAGNOSIS — I129 Hypertensive chronic kidney disease with stage 1 through stage 4 chronic kidney disease, or unspecified chronic kidney disease: Secondary | ICD-10-CM

## 2021-10-13 DIAGNOSIS — N1831 Chronic kidney disease, stage 3a: Secondary | ICD-10-CM

## 2021-10-14 NOTE — Patient Instructions (Addendum)
Visit Information   Thank you for taking time to visit with me today. Please don't hesitate to contact me if I can be of assistance to you before our next scheduled telephone appointment.  Following are the goals we discussed today:  (Copy and paste patient goals from clinical care plan here)  Our next appointment is by telephone on 10/25/21 at 11:15 AM  Please call the care guide team at [phone] if you need to cancel or reschedule your appointment.   If you are experiencing a Mental Health or [place] or need someone to talk to, please call [phone]-TALK (toll free, 24 hour hotline)   Following is a copy of your full care plan:  Care Plan : Diabetes Type 2 (Adult)  Updates made by Lynne Logan, RN since 10/13/2021 12:00 AM     Problem: Glycemic Management (Diabetes, Type 2)   Priority: Medium     Long-Range Goal: Glycemic Management Optimized   Start Date: 07/14/2021  Expected End Date: 07/14/2022  Recent Progress: On track  Priority: High  Note:   Objective:  Lab Results  Component Value Date   HGBA1C 6.7 (H) 07/12/2021   Lab Results  Component Value Date   CREATININE 1.13 (H) 07/12/2021   CREATININE 1.11 (H) 04/12/2021   CREATININE 0.98 02/18/2021   Lab Results  Component Value Date   EGFR 49 (L) 07/12/2021  Current Barriers:  Knowledge Deficits related to basic Diabetes pathophysiology and self care/management Knowledge Deficits related to medications used for management of diabetes Case Manager Clinical Goal(s):  patient will demonstrate improved adherence to prescribed treatment plan for diabetes self care/management as evidenced by: daily monitoring and recording of CBG  adherence to ADA/ carb modified diet exercise 5 days/week adherence to prescribed medication regimen contacting provider for new or worsened symptoms or questions Interventions:  Collaboration with [name], MD regarding development and update of comprehensive plan  of care as evidenced by provider attestation and co-signature Inter-disciplinary care team collaboration (see longitudinal plan of care) Determined patient completed the PAP paperwork for Trulicity and would like to confirm this was received by the embedded Pharm D  Collaborated with pharm D [name] to confirm the paperwork was received and is in progress, patient made aware  Discussed plans with patient for ongoing care management follow up and provided patient with direct contact information for care management team Self-Care Activities Self administers oral medications as prescribed Self administers injectable DM medication (Trulicity) as prescribed Attends all scheduled provider appointments Checks blood sugars as prescribed and utilize hyper and hypoglycemia protocol as needed Adheres to prescribed ADA/carb modified Patient Goals: - check blood sugar at prescribed times - check blood sugar if I feel it is too high or too low - take the blood sugar log to all doctor visits - drink 6 to 8 glasses of water each day - manage portion size  Follow Up Plan: Telephone follow up appointment with care management team member scheduled for: 10/25/21     Consent to CCM Services: Ms. Clos was given information about Chronic Care Management services including:  CCM service includes personalized support from designated clinical staff supervised by her physician, including individualized plan of care and coordination with other care providers 24/7 contact phone numbers for assistance for urgent and routine care needs. Service will only be billed when office clinical staff spend 20 minutes or more in a month to coordinate care. Only one practitioner may furnish and bill the service in a calendar  month. The patient may stop CCM services at any time (effective at the end of the month) by phone call to the office staff. The patient will be responsible for cost sharing (co-pay) of up to 20% of the  service fee (after annual deductible is met).  Patient agreed to services and verbal consent obtained.   The patient verbalized understanding of instructions, educational materials, and care plan provided today and declined offer to receive copy of patient instructions, educational materials, and care plan.   Telephone follow up appointment with care management team member scheduled for: 10/25/21

## 2021-10-14 NOTE — Chronic Care Management (AMB) (Signed)
Chronic Care Management   CCM RN Visit Note  10/13/2021 Name: [name] MRN: [id number] DOB: [birth year]  Subjective: [name] is a 80 y.o. year old female who is a primary care patient of Cheryl Chard, MD. The care management team was consulted for assistance with disease management and care coordination needs.    Engaged with patient by telephone for follow up visit in response to provider referral for case management and/or care coordination services.   Consent to Services:  The patient was given information about Chronic Care Management services, agreed to services, and gave verbal consent prior to initiation of services.  Please see initial visit note for detailed documentation.   Patient agreed to services and verbal consent obtained.   Assessment: Review of patient past medical history, allergies, medications, health status, including review of consultants reports, laboratory and other test data, was performed as part of comprehensive evaluation and provision of chronic care management services.   SDOH (Social Determinants of Health) assessments and interventions performed:    CCM Care Plan  Allergies  Allergen Reactions   Atorvastatin Calcium Itching   Bee Venom Hives   Oxycodone Other (See Comments)    hallucinations   Vicodin [Hydrocodone-Acetaminophen] Other (See Comments)    hallucinations    Outpatient Encounter Medications as of 10/13/2021  Medication Sig   allopurinol (ZYLOPRIM) 300 MG tablet Take 1 tablet by mouth daily   aspirin EC 81 MG tablet Take 1 tablet (81 mg total) by mouth daily.   atenolol-chlorthalidone (TENORETIC) 50-25 MG tablet TAKE 1 TABLET BY MOUTH DAILY   Blood Pressure KIT 1 Units by Does not apply route daily. Check blood pressure daily for hypertension dx code I10   Dulaglutide (TRULICITY) 0.25 EN/2.7PO SOPN Inject 0.5 mg into the skin every 14 (fourteen) days.   Lancets (ONETOUCH DELICA PLUS [id number]) MISC USE TO CHECK BLOOD  SUGAR TWICE DAILY   Magnesium 250 MG TABS Take 1 tablet (250 mg total) by mouth every evening. Take with dinner meal   Multiple Vitamin (MULTIVITAMIN) tablet Take 1 tablet by mouth daily.   Multiple Vitamins-Minerals (ZINC PO) Take 1 tablet by mouth as needed.   ONETOUCH VERIO test strip USE TO TEST 2 TIMES DAILY   rosuvastatin (CRESTOR) 20 MG tablet TAKE 1 TABLET(20 MG) BY MOUTH DAILY   Saccharomyces boulardii (PROBIOTIC) 250 MG CAPS 1 tablet by mouth daily   valsartan (DIOVAN) 80 MG tablet Take 1 tablet (80 mg total) by mouth daily.   vitamin B-12 (CYANOCOBALAMIN) 100 MCG tablet Take 1 tablet by mouth daily   Vitamin D, Ergocalciferol, (DRISDOL) 1.25 MG (50000 UNIT) CAPS capsule TAKE 1 CAPSULE BY MOUTH TWICE WEEKLY ON TUESDAYS AND FRIDAYS   No facility-administered encounter medications on file as of 10/13/2021.    Patient Active Problem List   Diagnosis Date Noted   Shortness of breath 09/14/2021   Stage 3a chronic kidney disease ([place]) 07/17/2021   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   Aortic atherosclerosis ([place]) 01/03/2021   Class 2 severe obesity due to excess calories with serious comorbidity and body mass index (BMI) of 39.0 to 39.9 in adult [facility]) 01/03/2021   Pruritus 01/03/2021   Vitamin D deficiency 06/01/2020   Abdominal bloating 02/17/2019   Urinary frequency 02/17/2019   Uncontrolled type 2 diabetes mellitus with hyperglycemia ([place]) 02/17/2019   Lightheaded 02/17/2019   Constipation 06/26/2018   Hyperlipidemia 08/23/2014   Type 2 diabetes mellitus with stage 3 chronic kidney disease, without long-term current use of insulin ([place])  06/19/2014   Mixed hyperlipidemia 06/19/2014   S/P urological surgery 09/30/2013   Joint pain 09/30/2013   UTI (urinary tract infection) 09/30/2013   Cough 12/10/2012   OA (osteoarthritis) of knee 01/05/2012   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 08/02/2011   Vaginal discharge 08/02/2011   Spasm of lumbar paraspinous muscle 06/17/2011    Morbidly obese ([place]) 05/31/2011   HIP PAIN, LEFT, CHRONIC 11/09/2010   CARPAL TUNNEL SYNDROME, RIGHT 10/14/2010   KNEE PAIN, RIGHT, CHRONIC 10/14/2010   LBBB (left bundle branch block) 58/85/0277   HELICOBACTER PYLORI GASTRITIS 07/18/2010   FATIGUE 07/01/2010   CHEST PAIN, ATYPICAL 07/01/2010   EPIGASTRIC PAIN 07/01/2010   SUPRAPUBIC PAIN 07/01/2010   HYPERGLYCEMIA 07/01/2010   CELLULITIS AND ABSCESS OF OTHER SPECIFIED SITE 02/15/2010   LUMP OR MASS IN BREAST 01/10/2010   DYSURIA, CHRONIC 03/09/2009   GERD 10/06/2008   Personal history of colonic polyps-adenoma 08/26/2008   LOC OSTEOARTHROS NOT SPEC PRIM/SEC LOWER LEG 09/10/2007   GOUT, UNSPECIFIED 09/09/2007   DIVERTICULOSIS, COLON 09/09/2007   DIVERTICULITIS, HX OF 09/09/2007   HYPERLIPIDEMIA 06/03/2007   Essential hypertension 06/03/2007    Conditions to be addressed/monitored: DM, HTN, CKD III  Care Plan : Diabetes Type 2 (Adult)  Updates made by Lynne Logan, RN since 10/13/2021 12:00 AM     Problem: Glycemic Management (Diabetes, Type 2)   Priority: Medium     Long-Range Goal: Glycemic Management Optimized   Start Date: 07/14/2021  Expected End Date: 07/14/2022  Recent Progress: On track  Priority: High  Note:   Objective:  Lab Results  Component Value Date   HGBA1C 6.7 (H) 07/12/2021   Lab Results  Component Value Date   CREATININE 1.13 (H) 07/12/2021   CREATININE 1.11 (H) 04/12/2021   CREATININE 0.98 02/18/2021   Lab Results  Component Value Date   EGFR 49 (L) 07/12/2021  Current Barriers:  Knowledge Deficits related to basic Diabetes pathophysiology and self care/management Knowledge Deficits related to medications used for management of diabetes Case Manager Clinical Goal(s):  patient will demonstrate improved adherence to prescribed treatment plan for diabetes self care/management as evidenced by: daily monitoring and recording of CBG  adherence to ADA/ carb modified diet exercise 5 days/week  adherence to prescribed medication regimen contacting provider for new or worsened symptoms or questions Interventions:  Collaboration with Cheryl Chard, MD regarding development and update of comprehensive plan of care as evidenced by provider attestation and co-signature Inter-disciplinary care team collaboration (see longitudinal plan of care) Determined patient completed the PAP paperwork for Trulicity and would like to confirm this was received by the embedded Pharm D  Collaborated with pharm D [name] to confirm the paperwork was received and is in progress, patient made aware  Discussed plans with patient for ongoing care management follow up and provided patient with direct contact information for care management team Self-Care Activities Self administers oral medications as prescribed Self administers injectable DM medication (Trulicity) as prescribed Attends all scheduled provider appointments Checks blood sugars as prescribed and utilize hyper and hypoglycemia protocol as needed Adheres to prescribed ADA/carb modified Patient Goals: - check blood sugar at prescribed times - check blood sugar if I feel it is too high or too low - take the blood sugar log to all doctor visits - drink 6 to 8 glasses of water each day - manage portion size  Follow Up Plan: Telephone follow up appointment with care management team member scheduled for: 10/25/21     Plan:Telephone follow  up appointment with care management team member scheduled for:  10/25/21  Barb Merino, RN, BSN, CCM Care Management Coordinator [place] [facility]  Direct Phone: [phone]

## 2021-10-17 ENCOUNTER — Encounter (HOSPITAL_BASED_OUTPATIENT_CLINIC_OR_DEPARTMENT_OTHER): Payer: Self-pay | Admitting: Cardiovascular Disease

## 2021-10-25 ENCOUNTER — Telehealth: Payer: Medicare Other

## 2021-10-25 ENCOUNTER — Telehealth: Payer: Self-pay

## 2021-10-25 NOTE — Chronic Care Management (AMB) (Signed)
° ° °  Chronic Care Management Pharmacy Assistant   Name: [name]  MRN: [id number] DOB: [birth year]  Reason for Encounter: Refill request   Medications: Outpatient Encounter Medications as of 10/25/2021  Medication Sig   allopurinol (ZYLOPRIM) 300 MG tablet Take 1 tablet by mouth daily   aspirin EC 81 MG tablet Take 1 tablet (81 mg total) by mouth daily.   atenolol-chlorthalidone (TENORETIC) 50-25 MG tablet TAKE 1 TABLET BY MOUTH DAILY   Blood Pressure KIT 1 Units by Does not apply route daily. Check blood pressure daily for hypertension dx code I10   Dulaglutide (TRULICITY) 5.50 AJ/1.4AZ SOPN Inject 0.5 mg into the skin every 14 (fourteen) days.   Lancets (ONETOUCH DELICA PLUS [id number]) MISC USE TO CHECK BLOOD SUGAR TWICE DAILY   Magnesium 250 MG TABS Take 1 tablet (250 mg total) by mouth every evening. Take with dinner meal   Multiple Vitamin (MULTIVITAMIN) tablet Take 1 tablet by mouth daily.   Multiple Vitamins-Minerals (ZINC PO) Take 1 tablet by mouth as needed.   ONETOUCH VERIO test strip USE TO TEST 2 TIMES DAILY   rosuvastatin (CRESTOR) 20 MG tablet TAKE 1 TABLET(20 MG) BY MOUTH DAILY   Saccharomyces boulardii (PROBIOTIC) 250 MG CAPS 1 tablet by mouth daily   valsartan (DIOVAN) 80 MG tablet Take 1 tablet (80 mg total) by mouth daily.   vitamin B-12 (CYANOCOBALAMIN) 100 MCG tablet Take 1 tablet by mouth daily   Vitamin D, Ergocalciferol, (DRISDOL) 1.25 MG (50000 UNIT) CAPS capsule TAKE 1 CAPSULE BY MOUTH TWICE WEEKLY ON TUESDAYS AND FRIDAYS   No facility-administered encounter medications on file as of 10/25/2021.   10-25-2021: Received a message from [facility] in pharmacy that patient is in need of a prescription for Benzonatate. [facility] yet. Sent request to [facility] CMA.  [place] Pharmacist Assistant [phone]

## 2021-10-25 NOTE — Telephone Encounter (Signed)
°  Care Management   Follow Up Note   10/25/2021 Name: [name] MRN: [id number] DOB: 02-15-1941   Referred by: [name], MD Reason for referral : Chronic Care Management (RN CM Follow up call )   An unsuccessful telephone outreach was attempted today. The patient was referred to the case management team for assistance with care management and care coordination.   Follow Up Plan: A HIPPA compliant phone message was left for the patient providing contact information and requesting a return call.   Barb Merino, RN, BSN, CCM Care Management Coordinator [place] [facility]  Direct Phone: [phone]

## 2021-10-29 DIAGNOSIS — N183 Chronic kidney disease, stage 3 unspecified: Secondary | ICD-10-CM

## 2021-10-29 DIAGNOSIS — I129 Hypertensive chronic kidney disease with stage 1 through stage 4 chronic kidney disease, or unspecified chronic kidney disease: Secondary | ICD-10-CM

## 2021-10-29 DIAGNOSIS — E1122 Type 2 diabetes mellitus with diabetic chronic kidney disease: Secondary | ICD-10-CM

## 2021-10-29 DIAGNOSIS — N1831 Chronic kidney disease, stage 3a: Secondary | ICD-10-CM

## 2021-11-03 ENCOUNTER — Other Ambulatory Visit: Payer: Self-pay

## 2021-11-03 ENCOUNTER — Telehealth: Payer: Self-pay

## 2021-11-03 ENCOUNTER — Ambulatory Visit (INDEPENDENT_AMBULATORY_CARE_PROVIDER_SITE_OTHER): Payer: Medicare Other | Admitting: Internal Medicine

## 2021-11-03 ENCOUNTER — Encounter: Payer: Self-pay | Admitting: Internal Medicine

## 2021-11-03 VITALS — BP 136/74 | HR 68 | Temp 98.2°F | Ht 66.8 in | Wt 237.2 lb

## 2021-11-03 DIAGNOSIS — E559 Vitamin D deficiency, unspecified: Secondary | ICD-10-CM | POA: Diagnosis not present

## 2021-11-03 DIAGNOSIS — N1831 Chronic kidney disease, stage 3a: Secondary | ICD-10-CM

## 2021-11-03 DIAGNOSIS — Z6837 Body mass index (BMI) 37.0-37.9, adult: Secondary | ICD-10-CM

## 2021-11-03 DIAGNOSIS — E1122 Type 2 diabetes mellitus with diabetic chronic kidney disease: Secondary | ICD-10-CM

## 2021-11-03 DIAGNOSIS — R1031 Right lower quadrant pain: Secondary | ICD-10-CM | POA: Diagnosis not present

## 2021-11-03 DIAGNOSIS — I129 Hypertensive chronic kidney disease with stage 1 through stage 4 chronic kidney disease, or unspecified chronic kidney disease: Secondary | ICD-10-CM

## 2021-11-03 DIAGNOSIS — G8929 Other chronic pain: Secondary | ICD-10-CM

## 2021-11-03 NOTE — Patient Instructions (Signed)

## 2021-11-03 NOTE — Progress Notes (Signed)
Chronic Care Management Pharmacy Assistant   Name: [name]  MRN: [id number] DOB: 02/27/1941   Reason for Encounter: Medication Review/Medication Coordination    Recent office visits:   10/25/2021 Barb Merino RN (CCM)  10/13/2021 Barb Merino RN (CCM)  10/04/2021 [name] (CCM)  Recent consult visits: None  Hospital visits:  None in previous 6 months  Medications: Outpatient Encounter Medications as of 11/03/2021  Medication Sig   allopurinol (ZYLOPRIM) 300 MG tablet Take 1 tablet by mouth daily   aspirin EC 81 MG tablet Take 1 tablet (81 mg total) by mouth daily.   atenolol-chlorthalidone (TENORETIC) 50-25 MG tablet TAKE 1 TABLET BY MOUTH DAILY   Blood Pressure KIT 1 Units by Does not apply route daily. Check blood pressure daily for hypertension dx code I10   Dulaglutide (TRULICITY) 6.38 GY/6.5LD SOPN Inject 0.5 mg into the skin every 14 (fourteen) days.   Lancets (ONETOUCH DELICA PLUS [id number]) MISC USE TO CHECK BLOOD SUGAR TWICE DAILY   Magnesium 250 MG TABS Take 1 tablet (250 mg total) by mouth every evening. Take with dinner meal   Multiple Vitamin (MULTIVITAMIN) tablet Take 1 tablet by mouth daily.   Multiple Vitamins-Minerals (ZINC PO) Take 1 tablet by mouth as needed.   ONETOUCH VERIO test strip USE TO TEST 2 TIMES DAILY   rosuvastatin (CRESTOR) 20 MG tablet TAKE 1 TABLET(20 MG) BY MOUTH DAILY   Saccharomyces boulardii (PROBIOTIC) 250 MG CAPS 1 tablet by mouth daily   valsartan (DIOVAN) 80 MG tablet Take 1 tablet (80 mg total) by mouth daily.   vitamin B-12 (CYANOCOBALAMIN) 100 MCG tablet Take 1 tablet by mouth daily   Vitamin D, Ergocalciferol, (DRISDOL) 1.25 MG (50000 UNIT) CAPS capsule TAKE 1 CAPSULE BY MOUTH TWICE WEEKLY ON TUESDAYS AND FRIDAYS   No facility-administered encounter medications on file as of 11/03/2021.   Reviewed chart for medication changes ahead of medication coordination call.  No OVs, Consults, or hospital visits since last  care coordination call/Pharmacist visit. (If appropriate, list visit date, provider name)  No medication changes indicated OR if recent visit, treatment plan here.  BP Readings from Last 3 Encounters:  09/14/21 134/68  07/12/21 132/70  04/12/21 (!) 150/78    Lab Results  Component Value Date   HGBA1C 6.7 (H) 07/12/2021     Patient obtains medications through Adherence Packaging  30 Days   Last adherence delivery included:   Valsartan 80 mg at breakfast Multivitamin - 1 tablet daily (Evening meal)  Probiotic daily- 1 tablet daily (Breakfast) Vitamin B complex - 1 tablet daily (Evening meal) Magnesium 250 mg- 1 tablet daily  (Evening meal) Allopurinol 300 mg- 1 tablet daily (Breakfast) Atenolol-Chlorthalidone 50/25 mg- 1 tablet daily (Breakfast) Rosuvastatin 20 mg- 1 tablet daily (Breakfast) Asa 81 mg- 1 tablet daily (Breakfast) Vitamin D 1.25 mg- 1 tablet twice a week on Tuesdays and Fridays (Breakfast) Vitamin B12- 1 tablet daily (Evening meal)  Patient declined (meds) last month due to PRN use/additional supply on hand. Explanation of abundance on hand (ie #30 due to overlapping fills or previous adherence issues etc)  Patient is due for next adherence delivery on: 11/15/2021.  Called patient and reviewed medications and coordinated delivery.  This delivery to include:  Valsartan 80 mg at breakfast Rosuvastatin 20 mg- 1 tablet daily (Breakfast) Allopurinol 300 mg- 1 tablet daily (Breakfast) Asa 81 mg- 1 tablet daily (Breakfast) Atenolol-Chlorthalidone 50/25 mg- 1 tablet daily (Breakfast) Multivitamin - 1 tablet daily (Evening meal)  Vitamin D  1.25 mg- 1 tablet twice a week on Tuesdays and Fridays (Breakfast) Probiotic daily- 1 tablet daily (Breakfast) Magnesium 250 mg- 1 tablet daily  (Evening meal) Vitamin B12- 1 tablet daily (Evening meal)    Patient will not need a short/ acute fill.  Patient did not decline any medications.  Patient needs refills for  Atenolo-chlorthalidone 50-71m 1 tablet daily [facility]  Confirmed delivery date of 11/15/2021, advised patient that pharmacy will contact them the morning of delivery.    Called patient 11/03/2021 VM full Called patient 11/04/2021 VM full  Care Gaps:  PNA Vac overdue Shingrix overdue AWV 06-22-2022  Star Rating Drugs:  Rosuvastatin 20 mg- Last filled 10-17-2021 30 [facility] Valsartan 80 mg- Last filled 10-17-2021 [facility] Trulicity 0.5 mg- Patient assistance   AMoundsPharmacist Assistant ([phone]

## 2021-11-03 NOTE — Progress Notes (Signed)
I,Katawbba Wiggins,acting as a Education administrator for Maximino Greenland, MD.,have documented all relevant documentation on the behalf of Maximino Greenland, MD,as directed by  Maximino Greenland, MD while in the presence of Maximino Greenland, MD.  This visit occurred during the SARS-CoV-2 public health emergency.  Safety protocols were in place, including screening questions prior to the visit, additional usage of staff PPE, and extensive cleaning of exam room while observing appropriate contact time as indicated for disinfecting solutions.  Subjective:     Patient ID: [name] , female    DOB: Aug 31, 1941 , 81 y.o.   MRN: [id number]   Chief Complaint  Patient presents with   Hypertension   Diabetes   vitamin d f/u    HPI  The patient is here for a diabetes and blood pressure, vitamin d  follow-up.  The patient states she is also having the same right side pain that started more than a year ago.   She denies n/v/d. She is unable to identify triggers for her sx.   Hypertension This is a chronic problem. The current episode started more than 1 year ago. The problem has been gradually improving since onset. The problem is controlled. Pertinent negatives include no blurred vision or chest pain. Risk factors for coronary artery disease include diabetes mellitus, dyslipidemia, obesity, post-menopausal state and sedentary lifestyle.  Diabetes She presents for her follow-up diabetic visit. She has type 2 diabetes mellitus. There are no hypoglycemic associated symptoms. Pertinent negatives for diabetes include no blurred vision and no chest pain. There are no hypoglycemic complications. Risk factors for coronary artery disease include diabetes mellitus, dyslipidemia, hypertension, obesity, sedentary lifestyle and post-menopausal. She is compliant with treatment most of the time. She participates in exercise intermittently. An ACE inhibitor/angiotensin II receptor blocker is being taken. Eye exam is current.    Past  Medical History:  Diagnosis Date   Abdominal pain    Arthritis    cervical disc degeneration/ oa left knee, carpal tunnel rt wrist, adhesive capsulitis right shoulder, rt hand weakness; lumbar degeneration   Carpal tunnel syndrome    Complication of anesthesia 2006-at [facility]   breathing problems-no BP med given prior to surgery;  hx of being very sleepy after colon surgery --  states no problems with last right total knee replacement 2013   Constipation    Diabetes mellitus without complication (HCC)    borderline - diet control   Diverticulitis hx of   Diverticulosis    E. coli infection    2020   Frequent UTI    hx of urethral injury during colon surgery - states frequent uti's since   GERD (gastroesophageal reflux disease)    H/O hiatal hernia    History of palpitations    in the past   History of shingles    has a lingering itching on back where shingles were   Hyperlipidemia    Hypertension    Numbness and tingling in right hand    pt. states has numbness of right hand very frequently-watch positioning   Obesity    Osteoarthritis    Osteoporosis    Pain    pain left knee and pain right hip and right groin   Personal history of colonic polyps-adenoma 08/26/2008   Pneumonia 2005   Shortness of breath 09/14/2021   Vitamin D deficiency      Family History  Problem Relation Age of Onset   Breast cancer Mother 65   Hypertension Mother  Prostate cancer Father    Hypertension Father    Dementia Sister    Lung cancer Brother    Hypertension Brother    COPD Brother    Cancer Maternal Grandmother    Arthritis Sister    Hypertension Sister    Arthritis Sister    Hypertension Child    Hypertension Child    Hypertension Child    Colon polyps Neg Hx    Esophageal cancer Neg Hx    Rectal cancer Neg Hx    Stomach cancer Neg Hx    Colon cancer Neg Hx      Current Outpatient Medications:    allopurinol (ZYLOPRIM) 300 MG tablet, Take 1 tablet by mouth daily, Disp:  90 tablet, Rfl: 1   aspirin EC 81 MG tablet, Take 1 tablet (81 mg total) by mouth daily., Disp: 90 tablet, Rfl: 1   atenolol-chlorthalidone (TENORETIC) 50-25 MG tablet, TAKE 1 TABLET BY MOUTH DAILY, Disp: 90 tablet, Rfl: 0   Blood Pressure KIT, 1 Units by Does not apply route daily. Check blood pressure daily for hypertension dx code I10, Disp: 1 kit, Rfl: 0   Dulaglutide (TRULICITY) 9.41 DE/0.8XK SOPN, Inject 0.5 mg into the skin every 14 (fourteen) days., Disp: , Rfl:    Lancets (ONETOUCH DELICA PLUS [id number]) MISC, USE TO CHECK BLOOD SUGAR TWICE DAILY, Disp: 100 each, Rfl: 3   Magnesium 250 MG TABS, Take 1 tablet (250 mg total) by mouth every evening. Take with dinner meal, Disp: 90 tablet, Rfl: 1   Multiple Vitamin (MULTIVITAMIN) tablet, Take 1 tablet by mouth daily., Disp: 90 tablet, Rfl: 1   Multiple Vitamins-Minerals (ZINC PO), Take 1 tablet by mouth as needed., Disp: , Rfl:    ONETOUCH VERIO test strip, USE TO TEST 2 TIMES DAILY, Disp: 100 strip, Rfl: 3   rosuvastatin (CRESTOR) 20 MG tablet, TAKE 1 TABLET(20 MG) BY MOUTH DAILY, Disp: 90 tablet, Rfl: 1   Saccharomyces boulardii (PROBIOTIC) 250 MG CAPS, 1 tablet by mouth daily, Disp: 90 capsule, Rfl: 1   valsartan (DIOVAN) 80 MG tablet, Take 1 tablet (80 mg total) by mouth daily., Disp: 90 tablet, Rfl: 3   vitamin B-12 (CYANOCOBALAMIN) 100 MCG tablet, Take 1 tablet by mouth daily, Disp: 90 tablet, Rfl: 1   Vitamin D, Ergocalciferol, (DRISDOL) 1.25 MG (50000 UNIT) CAPS capsule, TAKE 1 CAPSULE BY MOUTH TWICE WEEKLY ON TUESDAYS AND FRIDAYS, Disp: 26 capsule, Rfl: 0   Allergies  Allergen Reactions   Atorvastatin Calcium Itching   Bee Venom Hives   Oxycodone Other (See Comments)    hallucinations   Vicodin [Hydrocodone-Acetaminophen] Other (See Comments)    hallucinations     Review of Systems  Constitutional: Negative.   Eyes:  Negative for blurred vision.  Respiratory: Negative.    Cardiovascular: Negative.  Negative for chest pain.   Gastrointestinal: Negative.   Musculoskeletal:        Right side pain  Psychiatric/Behavioral: Negative.    All other systems reviewed and are negative.   Today's Vitals   11/03/21 1027  BP: 136/74  Pulse: 68  Temp: 98.2 F (36.8 C)  Weight: 237 lb 3.2 oz (107.6 kg)  Height: 5' 6.8" (1.697 m)   Body mass index is 37.37 kg/m.  Wt Readings from Last 3 Encounters:  11/03/21 237 lb 3.2 oz (107.6 kg)  09/14/21 237 lb (107.5 kg)  07/12/21 236 lb 3.2 oz (107.1 kg)    BP Readings from Last 3 Encounters:  11/03/21 136/74  09/14/21 134/68  07/12/21 132/70    Objective:  Physical Exam Vitals and nursing note reviewed.  Constitutional:      Appearance: Normal appearance. She is obese.  HENT:     Head: Normocephalic and atraumatic.     Nose:     Comments: Masked     Mouth/Throat:     Comments: Masked  Eyes:     Extraocular Movements: Extraocular movements intact.  Cardiovascular:     Rate and Rhythm: Normal rate and regular rhythm.     Heart sounds: Normal heart sounds.  Pulmonary:     Effort: Pulmonary effort is normal.     Breath sounds: Normal breath sounds.  Abdominal:     General: Bowel sounds are normal.     Palpations: Abdomen is soft.     Comments: Obese, soft NABS. Difficult to assess organomegaly  Musculoskeletal:     Cervical back: Normal range of motion.  Skin:    General: Skin is warm.  Neurological:     General: No focal deficit present.     Mental Status: She is alert.  Psychiatric:        Mood and Affect: Mood normal.        Behavior: Behavior normal.    Radiographic studies:  EXAM: CT ABDOMEN AND PELVIS WITH CONTRAST   TECHNIQUE: Multidetector CT imaging of the abdomen and pelvis was performed using the standard protocol following bolus administration of intravenous contrast.   CONTRAST:  163mL OMNIPAQUE IOHEXOL 300 MG/ML  SOLN   COMPARISON:  10/30/2019   FINDINGS: Lower chest: Normal   Hepatobiliary: Mild fatty change of the liver.  Multiple calcified gallstones within the gallbladder. No CT evidence of cholecystitis or obstruction.   Pancreas: Normal   Spleen: Normal   Adrenals/Urinary Tract: Adrenal glands are normal. Few small cortical low-density abnormalities of the kidneys. At the upper pole on the right, there is an enlarging focus measuring 13 mm in size which was not present at all on the study of November 2019. This could represent a small developing cortical mass. MRI with and without contrast is recommended. No bladder abnormality is seen.   Stomach/Bowel: Stomach is normal. No small bowel abnormality. The appendix is normal. There is diverticulosis of the left colon but without evidence of active diverticulitis presently. Previous bowel resection in the rectal region with anastomotic sutures.   Vascular/Lymphatic: Aortic atherosclerosis. No aneurysm. IVC is normal. No retroperitoneal adenopathy.   Reproductive: Previous hysterectomy.  No pelvic mass.   Other: Multiple areas of ventral fat herniation, similar to the previous exams. No evidence of herniated bowel.   Musculoskeletal: Lower lumbar degenerative changes. No acute musculoskeletal finding.   IMPRESSION: 1. No acute finding to explain right lower quadrant pain. The appendix is normal. Diverticulosis of the left colon but without evidence of active diverticulitis presently. 2. Multiple areas of ventral fat herniation, similar to the previous exams. No evidence of herniated bowel. 3. Mild fatty change of the liver. 4. Cholelithiasis without CT evidence of cholecystitis or obstruction. 5. 13 mm enlarging focus at the upper pole of the right kidney. This could represent a small developing cortical mass. MRI with and without contrast is recommended electively. 6. Aortic atherosclerosis.    EXAM: MRI ABDOMEN WITHOUT AND WITH CONTRAST   TECHNIQUE: Multiplanar multisequence MR imaging of the abdomen was performed both before and  after the administration of intravenous contrast.   CONTRAST:  25mL GADAVIST GADOBUTROL 1 MMOL/ML IV SOLN   COMPARISON:  CTs including 02/18/2021.   FINDINGS:  Lower chest: Mild cardiomegaly, without pericardial or pleural effusion. Moderate right hemidiaphragm elevation.   Hepatobiliary: Normal liver. Multiple gallstones without acute cholecystitis or biliary duct dilatation.   Pancreas: Normal pancreas for age, without duct dilatation or acute inflammation.   Spleen:  Normal in size, without focal abnormality.   Adrenals/Urinary Tract: Normal adrenal glands. Corresponding to the CT abnormality, within the medial upper pole right kidney is a 9 mm lesion which is relatively isointense prior to contrast on 01/07/2049 and demonstrates no post-contrast enhancement, including on subtracted image 53/20.   Tiny left renal lesions are most consistent with simple cysts. No hydronephrosis.   Stomach/Bowel: Normal stomach and small bowel. Extensive colonic diverticulosis.   Vascular/Lymphatic: Aortic atherosclerosis. No retroperitoneal or retrocrural adenopathy.   Other:  No ascites.   Musculoskeletal: No acute osseous abnormality.   Portions of exam are minimally motion degraded.   IMPRESSION: 1. The right renal lesion of interest represents a minimally complex cyst. 2.  No acute abdominal process. 3. Cholelithiasis.  Assessment And Plan:     1. Hypertensive nephropathy Comments: Chronic, fair control. Pt admits she has yet to take her meds today. Importance of med/dietary compliance was d/w patient.   2. Type 2 diabetes mellitus with stage 3a chronic kidney disease, without long-term current use of insulin (HCC) Comments: Chronic, I will check labs as listed below. I will adjust meds as needed. She will f/u in 4 months for re-evaluation.  - Hemoglobin A1c - CMP14+EGFR - Lipid panel - Protein electrophoresis, serum  3. Chronic RLQ pain Comments: Chronic, CT abdomen/MRI  results reviewed in full detail. May benefit from PPI therapy.  4. Vitamin D deficiency Comments: I will check a vitamin D level and supplement as needed.  - Vitamin D (25 hydroxy)  5. Class 2 severe obesity due to excess calories with serious comorbidity and body mass index (BMI) of 37.0 to 37.9 in adult [facility])  She is encouraged to strive for BMI less than 30 to decrease cardiac risk. Advised to aim for at least 150 minutes of exercise per week.    Patient was given opportunity to ask questions. Patient verbalized understanding of the plan and was able to repeat key elements of the plan. All questions were answered to their satisfaction.   I, Maximino Greenland, MD, have reviewed all documentation for this visit. The documentation on 11/03/21 for the exam, diagnosis, procedures, and orders are all accurate and complete.   IF YOU HAVE BEEN REFERRED TO A SPECIALIST, IT MAY TAKE 1-2 WEEKS TO SCHEDULE/PROCESS THE REFERRAL. IF YOU HAVE NOT HEARD FROM US/SPECIALIST IN TWO WEEKS, PLEASE GIVE [place] A CALL AT [phone] X [phone].   THE PATIENT IS ENCOURAGED TO PRACTICE SOCIAL DISTANCING DUE TO THE COVID-19 PANDEMIC.

## 2021-11-04 ENCOUNTER — Ambulatory Visit: Payer: Self-pay

## 2021-11-04 ENCOUNTER — Telehealth: Payer: Medicare Other

## 2021-11-04 ENCOUNTER — Ambulatory Visit (INDEPENDENT_AMBULATORY_CARE_PROVIDER_SITE_OTHER): Payer: Medicare Other

## 2021-11-04 DIAGNOSIS — E1122 Type 2 diabetes mellitus with diabetic chronic kidney disease: Secondary | ICD-10-CM

## 2021-11-04 DIAGNOSIS — E559 Vitamin D deficiency, unspecified: Secondary | ICD-10-CM

## 2021-11-04 DIAGNOSIS — I129 Hypertensive chronic kidney disease with stage 1 through stage 4 chronic kidney disease, or unspecified chronic kidney disease: Secondary | ICD-10-CM

## 2021-11-04 DIAGNOSIS — N183 Chronic kidney disease, stage 3 unspecified: Secondary | ICD-10-CM

## 2021-11-04 NOTE — Chronic Care Management (AMB) (Signed)
°Chronic Care Management  ° °CCM RN Visit Note ° °11/04/2021 °Name: [name] MRN: [id number] DOB: 11/12/1940 ° °Subjective: °[name] is a 80 y.o. year old female who is a primary care patient of Sanders, Robyn, MD. The care management team was consulted for assistance with disease management and care coordination needs.   ° °Engaged with patient by telephone for follow up visit in response to provider referral for case management and/or care coordination services.  ° °Consent to Services:  °The patient was given information about Chronic Care Management services, agreed to services, and gave verbal consent prior to initiation of services.  Please see initial visit note for detailed documentation.  ° °Patient agreed to services and verbal consent obtained.  ° °Assessment: Review of patient past medical history, allergies, medications, health status, including review of consultants reports, laboratory and other test data, was performed as part of comprehensive evaluation and provision of chronic care management services.  ° °SDOH (Social Determinants of Health) assessments and interventions performed:  Yes, no acute challenges ° °CCM Care Plan ° °Allergies  °Allergen Reactions  ° Atorvastatin Calcium Itching  ° Bee Venom Hives  ° Oxycodone Other (See Comments)  °  hallucinations  ° Vicodin [Hydrocodone-Acetaminophen] Other (See Comments)  °  hallucinations  ° ° °Outpatient Encounter Medications as of 11/04/2021  °Medication Sig  ° allopurinol (ZYLOPRIM) 300 MG tablet Take 1 tablet by mouth daily  ° aspirin EC 81 MG tablet Take 1 tablet (81 mg total) by mouth daily.  ° atenolol-chlorthalidone (TENORETIC) 50-25 MG tablet TAKE 1 TABLET BY MOUTH DAILY  ° Blood Pressure KIT 1 Units by Does not apply route daily. Check blood pressure daily for hypertension dx code I10  ° Dulaglutide (TRULICITY) 0.75 MG/0.5ML SOPN Inject 0.5 mg into the skin every 14 (fourteen) days.  ° Lancets (ONETOUCH DELICA PLUS LANCET33G)  MISC USE TO CHECK BLOOD SUGAR TWICE DAILY  ° Magnesium 250 MG TABS Take 1 tablet (250 mg total) by mouth every evening. Take with dinner meal  ° Multiple Vitamin (MULTIVITAMIN) tablet Take 1 tablet by mouth daily.  ° Multiple Vitamins-Minerals (ZINC PO) Take 1 tablet by mouth as needed.  ° ONETOUCH VERIO test strip USE TO TEST 2 TIMES DAILY  ° rosuvastatin (CRESTOR) 20 MG tablet TAKE 1 TABLET(20 MG) BY MOUTH DAILY  ° Saccharomyces boulardii (PROBIOTIC) 250 MG CAPS 1 tablet by mouth daily  ° valsartan (DIOVAN) 80 MG tablet Take 1 tablet (80 mg total) by mouth daily.  ° vitamin B-12 (CYANOCOBALAMIN) 100 MCG tablet Take 1 tablet by mouth daily  ° Vitamin D, Ergocalciferol, (DRISDOL) 1.25 MG (50000 UNIT) CAPS capsule TAKE 1 CAPSULE BY MOUTH TWICE WEEKLY ON TUESDAYS AND FRIDAYS  ° °No facility-administered encounter medications on file as of 11/04/2021.  ° ° °Patient Active Problem List  ° Diagnosis Date Noted  ° Shortness of breath 09/14/2021  ° Stage 3a chronic kidney disease (HCC) 07/17/2021  ° 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  ° Aortic atherosclerosis (HCC) 01/03/2021  ° Class 2 severe obesity due to excess calories with serious comorbidity and body mass index (BMI) of 39.0 to 39.9 in adult (HCC) 01/03/2021  ° Pruritus 01/03/2021  ° Vitamin D deficiency 06/01/2020  ° Abdominal bloating 02/17/2019  ° Urinary frequency 02/17/2019  ° Uncontrolled type 2 diabetes mellitus with hyperglycemia (HCC) 02/17/2019  ° Lightheaded 02/17/2019  ° Constipation 06/26/2018  ° Hyperlipidemia 08/23/2014  ° Type 2 diabetes mellitus with stage 3 chronic kidney disease, without long-term current use   use of insulin ([place]) 06/19/2014   Mixed hyperlipidemia 06/19/2014   S/P urological surgery 09/30/2013   Joint pain 09/30/2013   UTI (urinary tract infection) 09/30/2013   Cough 12/10/2012   OA (osteoarthritis) of knee 01/05/2012   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 08/02/2011   Vaginal discharge 08/02/2011   Spasm of lumbar  paraspinous muscle 06/17/2011   Morbidly obese ([place]) 05/31/2011   HIP PAIN, LEFT, CHRONIC 11/09/2010   CARPAL TUNNEL SYNDROME, RIGHT 10/14/2010   KNEE PAIN, RIGHT, CHRONIC 10/14/2010   LBBB (left bundle branch block) 22/33/6122   HELICOBACTER PYLORI GASTRITIS 07/18/2010   FATIGUE 07/01/2010   CHEST PAIN, ATYPICAL 07/01/2010   EPIGASTRIC PAIN 07/01/2010   SUPRAPUBIC PAIN 07/01/2010   HYPERGLYCEMIA 07/01/2010   CELLULITIS AND ABSCESS OF OTHER SPECIFIED SITE 02/15/2010   LUMP OR MASS IN BREAST 01/10/2010   DYSURIA, CHRONIC 03/09/2009   GERD 10/06/2008   Personal history of colonic polyps-adenoma 08/26/2008   LOC OSTEOARTHROS NOT SPEC PRIM/SEC LOWER LEG 09/10/2007   GOUT, UNSPECIFIED 09/09/2007   DIVERTICULOSIS, COLON 09/09/2007   DIVERTICULITIS, HX OF 09/09/2007   HYPERLIPIDEMIA 06/03/2007   Essential hypertension 06/03/2007    Conditions to be addressed/monitored: DM, HTN, CKD III, Vitamin D deficiency  Care Plan : RN Care Manager Plan of Care  Updates made by Lynne Logan, RN since 11/04/2021 12:00 AM     Problem: No Plan of Care Established for management of chronic disease states (DM, HTN, CKD III, Vitamin D deficiency)   Priority: High     Long-Range Goal: Establishment of plan of care for management of chronic disease states (DM, HTN, CKD III, Vitamin D deficiency)   Start Date: 11/04/2021  Expected End Date: 11/03/2022  This Visit's Progress: On track  Priority: High  Note:   Current Barriers:  Knowledge Deficits related to plan of care for management of DM, HTN, CKD III, Vitamin D deficiency   Chronic Disease Management support and education needs related to DM, HTN, CKD III, Vitamin D deficiency    RNCM Clinical Goal(s):  Patient will verbalize basic understanding of  DM, HTN, CKD III, Vitamin D deficiency  disease process and self health management plan as evidenced by patient will report having no disease exacerbations related to her chronic disease states as listed  above  take all medications exactly as prescribed and will call provider for medication related questions as evidenced by patient will report having no missed doses of her prescribed medications  demonstrate Improved health management independence as evidenced by patient will report 100% of her prescribed treatment plan  continue to work with RN Care Manager to address care management and care coordination needs related to  DM, HTN, CKD III, Vitamin D deficiency  as evidenced by adherence to CM Team Scheduled appointments demonstrate ongoing self health care management ability   as evidenced by    through collaboration with RN Care manager, provider, and care team.   Interventions: 1:1 collaboration with primary care provider regarding development and update of comprehensive plan of care as evidenced by provider attestation and co-signature Inter-disciplinary care team collaboration (see longitudinal plan of care) Evaluation of current treatment plan related to  self management and patient's adherence to plan as established by provider   Chronic Kidney Disease Interventions:  (Status:  Goal on track:  Yes.) Long Term Goal Assessed the Patient understanding of chronic kidney disease    Evaluation of current treatment plan related to chronic kidney disease self management and patient's adherence to plan as  by provider      °Reviewed prescribed diet increase daily water intake to 64 oz   °Provided education on kidney disease progression    °Discussed plans with patient for ongoing care management follow up and provided patient with direct contact information for care management team °Last practice recorded BP readings:  °BP Readings from Last 3 Encounters:  °11/03/21 136/74  °09/14/21 134/68  °07/12/21 132/70  °Most recent eGFR/CrCl:  °Lab Results  °Component Value Date  ° EGFR 49 (L) 07/12/2021  °  No components found for: CRCL ° °Diabetes Interventions:  (Status:  Goal on track:  Yes.) Long  Term Goal °Assessed patient's understanding of A1c goal: <6.5% °Provided education to patient about basic DM disease process °Reviewed medications with patient and discussed importance of medication adherence °Counseled on importance of regular laboratory monitoring as prescribed °Review of patient status, including review of consultants reports, relevant laboratory and other test results, and medications completed °Assessed social determinant of health barriers °Collaboration with embedded Pharm D Vallie Pearson via inbasket message a requesting status update for patient's Trul[employer] PAP °Discussed plans with patient for ongoing care management follow up and provided patient with direct contact information for care management team °Lab Results  °Component Value Date  ° HGBA1C 6.7 (H) 07/12/2021  ° °Impaired Urinary Elimination Interventions:  (Status:  Goal on track:  Yes.)  Long Term Goal °Evaluation of current treatment plan related to  Impaired Urinary Elimination , self-management and patient's adherence to plan as established by provider °Determined patient continues to use urinary incontinent supplies via [employer] and will need to order new supplies °Provided patient the contact name/number for which she can order supplies  °Discussed perineal skin is clean, dry and intact with no change in urinary incontinence °Discussed plans with patient for ongoing care management follow up and provided patient with direct contact information for care management team ° ° °Vitamin D deficiency Interventions:  (Status:  Condition stable.  Not addressed this visit.)  Long Term Goal °Evaluation of current treatment plan related to  Vitamin D deficiency ,  self-management and patient's adherence to plan as established by provider °Review of patient status, including review of consultant's reports, relevant laboratory and other test results, and medications completed. °Reviewed medications with patient and discussed  importance of medication adherence °Educated patient on importance to eat a Vitamin D rich diet, try to get at least 15 minutes of natural sunlight when possible  °Discussed plans with patient for ongoing care management follow up and provided patient with direct contact information for care management team °Component Ref Range & Units 1 yr ago   °Vit D, 25-Hydroxy 30.0 - 100.0 ng/mL 22.8 Low    °  ° °Patient Goals/Self-Care Activities: °Take all medications as prescribed °Attend all scheduled provider appointments °Call pharmacy for medication refills 3-7 days in advance of running out of medications °Attend church or other social activities °Perform all self care activities independently  °Perform IADL's (shopping, preparing meals, housekeeping, managing finances) independently °Call provider office for new concerns or questions  °Work with the social worker to address care coordination needs and will continue to work with the clinical team to address health care and disease management related needs °drink 6 to 8 glasses of water each day °manage portion size ° °Follow Up Plan:  Telephone follow up appointment with care management team member scheduled for:  03/08/22 ° °  ° ° °Plan:Telephone follow up appointment with care management team member scheduled for:    03/08/22 ° °Angel Little, RN, BSN, CCM °Care Management Coordinator °[facility]  °Direct Phone: [phone]  ° ° ° ° ° ° ° ° ° °

## 2021-11-04 NOTE — Chronic Care Management (AMB) (Signed)
Chronic Care Management    Social Work Note  11/04/2021 Name: [name] MRN: [id number] DOB: [birth year]  [name] is a 81 y.o. year old female who is a primary care patient of [name], MD. The CCM team was consulted to assist the patient with chronic disease management and/or care coordination needs related to:  DM II, HTN, CKD III .   Engaged with patient by telephone for follow up visit in response to provider referral for social work chronic care management and care coordination services.   Consent to Services:  The patient was given information about Chronic Care Management services, agreed to services, and gave verbal consent prior to initiation of services.  Please see initial visit note for detailed documentation.   Patient agreed to services and consent obtained.   Assessment: Review of patient past medical history, allergies, medications, and health status, including review of relevant consultants reports was performed today as part of a comprehensive evaluation and provision of chronic care management and care coordination services.     SDOH (Social Determinants of Health) assessments and interventions performed:    Advanced Directives Status: Not addressed in this encounter.  CCM Care Plan  Allergies  Allergen Reactions   Atorvastatin Calcium Itching   Bee Venom Hives   Oxycodone Other (See Comments)    hallucinations   Vicodin [Hydrocodone-Acetaminophen] Other (See Comments)    hallucinations    Outpatient Encounter Medications as of 11/04/2021  Medication Sig   allopurinol (ZYLOPRIM) 300 MG tablet Take 1 tablet by mouth daily   aspirin EC 81 MG tablet Take 1 tablet (81 mg total) by mouth daily.   atenolol-chlorthalidone (TENORETIC) 50-25 MG tablet TAKE 1 TABLET BY MOUTH DAILY   Blood Pressure KIT 1 Units by Does not apply route daily. Check blood pressure daily for hypertension dx code I10   Dulaglutide (TRULICITY) 7.01 XB/9.3JQ SOPN Inject 0.5 mg  into the skin every 14 (fourteen) days.   Lancets (ONETOUCH DELICA PLUS ZESPQZ30Q) MISC USE TO CHECK BLOOD SUGAR TWICE DAILY   Magnesium 250 MG TABS Take 1 tablet (250 mg total) by mouth every evening. Take with dinner meal   Multiple Vitamin (MULTIVITAMIN) tablet Take 1 tablet by mouth daily.   Multiple Vitamins-Minerals (ZINC PO) Take 1 tablet by mouth as needed.   ONETOUCH VERIO test strip USE TO TEST 2 TIMES DAILY   rosuvastatin (CRESTOR) 20 MG tablet TAKE 1 TABLET(20 MG) BY MOUTH DAILY   Saccharomyces boulardii (PROBIOTIC) 250 MG CAPS 1 tablet by mouth daily   valsartan (DIOVAN) 80 MG tablet Take 1 tablet (80 mg total) by mouth daily.   vitamin B-12 (CYANOCOBALAMIN) 100 MCG tablet Take 1 tablet by mouth daily   Vitamin D, Ergocalciferol, (DRISDOL) 1.25 MG (50000 UNIT) CAPS capsule TAKE 1 CAPSULE BY MOUTH TWICE WEEKLY ON TUESDAYS AND FRIDAYS   No facility-administered encounter medications on file as of 11/04/2021.    Patient Active Problem List   Diagnosis Date Noted   Shortness of breath 09/14/2021   Stage 3a chronic kidney disease ([place]) 07/17/2021   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   Aortic atherosclerosis ([place]) 01/03/2021   Class 2 severe obesity due to excess calories with serious comorbidity and body mass index (BMI) of 39.0 to 39.9 in adult [facility]) 01/03/2021   Pruritus 01/03/2021   Vitamin D deficiency 06/01/2020   Abdominal bloating 02/17/2019   Urinary frequency 02/17/2019   Uncontrolled type 2 diabetes mellitus with hyperglycemia ([place]) 02/17/2019   Lightheaded 02/17/2019   Constipation  06/26/2018   Hyperlipidemia 08/23/2014   Type 2 diabetes mellitus with stage 3 chronic kidney disease, without long-term current use of insulin ([place]) 06/19/2014   Mixed hyperlipidemia 06/19/2014   S/P urological surgery 09/30/2013   Joint pain 09/30/2013   UTI (urinary tract infection) 09/30/2013   Cough 12/10/2012   OA (osteoarthritis) of knee 01/05/2012   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 08/02/2011   Vaginal discharge 08/02/2011   Spasm of lumbar paraspinous muscle 06/17/2011   Morbidly obese ([place]) 05/31/2011   HIP PAIN, LEFT, CHRONIC 11/09/2010   CARPAL TUNNEL SYNDROME, RIGHT 10/14/2010   KNEE PAIN, RIGHT, CHRONIC 10/14/2010   LBBB (left bundle branch block) 76/16/0737   HELICOBACTER PYLORI GASTRITIS 07/18/2010   FATIGUE 07/01/2010   CHEST PAIN, ATYPICAL 07/01/2010   EPIGASTRIC PAIN 07/01/2010   SUPRAPUBIC PAIN 07/01/2010   HYPERGLYCEMIA 07/01/2010   CELLULITIS AND ABSCESS OF OTHER SPECIFIED SITE 02/15/2010   LUMP OR MASS IN BREAST 01/10/2010   DYSURIA, CHRONIC 03/09/2009   GERD 10/06/2008   Personal history of colonic polyps-adenoma 08/26/2008   LOC OSTEOARTHROS NOT SPEC PRIM/SEC LOWER LEG 09/10/2007   GOUT, UNSPECIFIED 09/09/2007   DIVERTICULOSIS, COLON 09/09/2007   DIVERTICULITIS, HX OF 09/09/2007   HYPERLIPIDEMIA 06/03/2007   Essential hypertension 06/03/2007    Conditions to be addressed/monitored: HTN, DMII, and CKD Stage III  Care Plan : Social Work Plan of Care  Updates made by [name] since 11/04/2021 12:00 AM  Completed 11/04/2021   Problem: Quality of Life (General Plan of Care) Resolved 11/04/2021     Goal: Quality of Life Maintained Completed 11/04/2021  Start Date: 11/04/2021  Priority: High  Note:   Current Barriers:  Chronic disease management support and education needs related to HTN, DM, and CKD Stage III   Limited understanding of how to access over the counter benefit  Social Worker Clinical Goal(s):  patient will work with SW to identify and address any acute and/or chronic care coordination needs related to the self health management of HTN, DM, and CKD Stage III    SW Interventions:  Inter-disciplinary care team collaboration (see longitudinal plan of care) Collaboration with [name], MD regarding development and update of comprehensive plan of care as evidenced by provider attestation and  co-signature Successful outbound call placed to the patient in a response to a voice message received requesting SW assistance Determined the patient would like help ordering briefs through her over the counter benefit Verbally discussed process to order supplies - patient is unable to complete a joint call at this time due to being busy at work Provided the patient with the contact number to [facility] in order for her to order supplies at her convenience  Discussed no planned follow up with SW at this time but patient will remain actively involved with  RN Case Manager and Pharmacist  to address care management needs  Patient Goals/Self-Care Activities patient will:   -  Contact her health plan at her convenience to order supplies -Contact SW as needed        Follow Up Plan:  No SW follow up planned at this time. Patient will remain active with RN Care Manager and PharmD to address care management needs.      [name], BSW, CDP Social Worker, Certified Dementia Practitioner [place] [facility] [place] [facility] [phone]

## 2021-11-04 NOTE — Patient Instructions (Signed)
Visit Information  Thank you for taking time to visit with me today. Please don't hesitate to contact me if I can be of assistance to you before our next scheduled telephone appointment.  Following are the goals we discussed today:  (Copy and paste patient goals from clinical care plan here)  Our next appointment is by telephone on 03/08/22 at 10:30 AM  Please call the care guide team at [phone] if you need to cancel or reschedule your appointment.   If you are experiencing a Mental Health or [place] or need someone to talk to, please call [phone]-TALK (toll free, 24 hour hotline)   Patient verbalizes understanding of instructions provided today and agrees to view in [place].    Barb Merino, RN, BSN, CCM Care Management Coordinator [place] [facility]  Direct Phone: [phone]

## 2021-11-04 NOTE — Patient Instructions (Signed)
Social Worker Visit Information  Goals we discussed today:    Patient Goals/Self-Care Activities patient will:   - Contact her health plan at her convenience to order supplies -Contact SW as needed  Materials Provided: Verbal education about health plan benefits provided by phone  Patient verbalizes understanding of instructions provided today and agrees to view in [place].   Follow Up Plan:  No SW follow up planned at this time. Please contact me as needed.   [name], BSW, CDP Social Worker, Certified Dementia Practitioner [place] [facility] [place] [facility] [phone]

## 2021-11-07 ENCOUNTER — Other Ambulatory Visit: Payer: Self-pay

## 2021-11-07 DIAGNOSIS — R051 Acute cough: Secondary | ICD-10-CM

## 2021-11-07 LAB — LIPID PANEL
Chol/HDL Ratio: 4.2 ratio (ref 0.0–4.4)
Cholesterol, Total: 181 mg/dL (ref 100–199)
HDL: 43 mg/dL (ref >39)
LDL Chol Calc (NIH): 113 mg/dL — ABNORMAL HIGH (ref 0–99)
Triglycerides: 140 mg/dL (ref 0–149)
VLDL Cholesterol Cal: 25 mg/dL (ref 5–40)

## 2021-11-07 LAB — PROTEIN ELECTROPHORESIS, SERUM
A/G Ratio: 1.1 (ref 0.7–1.7)
Albumin ELP: 3.6 g/dL (ref 2.9–4.4)
Alpha 1: 0.2 g/dL (ref 0.0–0.4)
Alpha 2: 1 g/dL (ref 0.4–1.0)
Beta: 1.1 g/dL (ref 0.7–1.3)
Gamma Globulin: 1 g/dL (ref 0.4–1.8)
Globulin, Total: 3.3 g/dL (ref 2.2–3.9)

## 2021-11-07 LAB — CMP14+EGFR
ALT: 27 IU/L (ref 0–32)
AST: 35 IU/L (ref 0–40)
Albumin/Globulin Ratio: 1.6 (ref 1.2–2.2)
Albumin: 4.2 g/dL (ref 3.7–4.7)
Alkaline Phosphatase: 51 IU/L (ref 44–121)
BUN/Creatinine Ratio: 19 (ref 12–28)
BUN: 21 mg/dL (ref 8–27)
Bilirubin Total: 0.7 mg/dL (ref 0.0–1.2)
CO2: 25 mmol/L (ref 20–29)
Calcium: 9.4 mg/dL (ref 8.7–10.3)
Chloride: 103 mmol/L (ref 96–106)
Creatinine, Ser: 1.1 mg/dL — ABNORMAL HIGH (ref 0.57–1.00)
Globulin, Total: 2.7 g/dL (ref 1.5–4.5)
Glucose: 154 mg/dL — ABNORMAL HIGH (ref 70–99)
Potassium: 4.2 mmol/L (ref 3.5–5.2)
Sodium: 141 mmol/L (ref 134–144)
Total Protein: 6.9 g/dL (ref 6.0–8.5)
eGFR: 51 mL/min/{1.73_m2} — ABNORMAL LOW (ref >59)

## 2021-11-07 LAB — VITAMIN D 25 HYDROXY (VIT D DEFICIENCY, FRACTURES): Vit D, 25-Hydroxy: 59 ng/mL (ref 30.0–100.0)

## 2021-11-07 LAB — HEMOGLOBIN A1C
Est. average glucose Bld gHb Est-mCnc: 140 mg/dL
Hgb A1c MFr Bld: 6.5 % — ABNORMAL HIGH (ref 4.8–5.6)

## 2021-11-07 MED ORDER — ATENOLOL-CHLORTHALIDONE 50-25 MG PO TABS: 1.0000 | ORAL_TABLET | Freq: Every day | ORAL | 1 refills | Status: DC

## 2021-11-07 MED ORDER — BENZONATATE 100 MG PO CAPS: 100.0000 mg | ORAL_CAPSULE | Freq: Three times a day (TID) | ORAL | 1 refills | Status: DC | PRN

## 2021-11-15 ENCOUNTER — Telehealth: Payer: Self-pay

## 2021-11-15 ENCOUNTER — Telehealth: Payer: Medicare Other

## 2021-11-15 DIAGNOSIS — I129 Hypertensive chronic kidney disease with stage 1 through stage 4 chronic kidney disease, or unspecified chronic kidney disease: Secondary | ICD-10-CM | POA: Diagnosis not present

## 2021-11-15 DIAGNOSIS — E785 Hyperlipidemia, unspecified: Secondary | ICD-10-CM | POA: Diagnosis not present

## 2021-11-15 DIAGNOSIS — N2889 Other specified disorders of kidney and ureter: Secondary | ICD-10-CM | POA: Diagnosis not present

## 2021-11-15 DIAGNOSIS — N390 Urinary tract infection, site not specified: Secondary | ICD-10-CM | POA: Diagnosis not present

## 2021-11-15 DIAGNOSIS — E1122 Type 2 diabetes mellitus with diabetic chronic kidney disease: Secondary | ICD-10-CM | POA: Diagnosis not present

## 2021-11-15 DIAGNOSIS — M545 Low back pain, unspecified: Secondary | ICD-10-CM | POA: Diagnosis not present

## 2021-11-15 DIAGNOSIS — N1831 Chronic kidney disease, stage 3a: Secondary | ICD-10-CM | POA: Diagnosis not present

## 2021-11-15 DIAGNOSIS — D631 Anemia in chronic kidney disease: Secondary | ICD-10-CM | POA: Diagnosis not present

## 2021-11-15 DIAGNOSIS — N2581 Secondary hyperparathyroidism of renal origin: Secondary | ICD-10-CM | POA: Diagnosis not present

## 2021-11-15 NOTE — Telephone Encounter (Signed)
I called the pt and scheduled her an appt for back pain tomorrow. The pt said she will cancel if she can get treatment at the neuro appt she has scheduled for today.

## 2021-11-16 ENCOUNTER — Ambulatory Visit: Payer: Medicare Other | Admitting: Nurse Practitioner

## 2021-11-17 ENCOUNTER — Ambulatory Visit: Payer: Medicare Other | Admitting: Podiatrist

## 2021-11-17 ENCOUNTER — Other Ambulatory Visit: Payer: Self-pay

## 2021-11-17 DIAGNOSIS — B351 Tinea unguium: Secondary | ICD-10-CM

## 2021-11-17 DIAGNOSIS — M79609 Pain in unspecified limb: Secondary | ICD-10-CM | POA: Diagnosis not present

## 2021-11-17 NOTE — Progress Notes (Signed)
Chief Complaint  Patient presents with   Nail Problem     HPI: Patient is 81 y.o. female who presents today for elongated toenails which are painful and thick in shoe gear.  She relates she is tried trimming them herself however she is unable to.  She has diabetic and relates no numbness or tingling in her feet or toes.  Last hemoglobin A1c was 6.1.  She keeps her blood sugar under good control. 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   Aortic atherosclerosis ([place]) 01/03/2021   Class 2 severe obesity due to excess calories with serious comorbidity and body mass index (BMI) of 39.0 to 39.9 in adult [facility]) 01/03/2021   Pruritus 01/03/2021   Vitamin D deficiency 06/01/2020   Abdominal bloating 02/17/2019   Urinary frequency 02/17/2019   Uncontrolled type 2 diabetes mellitus with hyperglycemia ([place]) 02/17/2019   Lightheaded 02/17/2019   Constipation 06/26/2018   Hyperlipidemia 08/23/2014   Type 2 diabetes mellitus with stage 3 chronic kidney disease, without long-term current use of insulin ([place]) 06/19/2014   Mixed hyperlipidemia 06/19/2014   S/P urological surgery 09/30/2013   Joint pain 09/30/2013   UTI (urinary tract infection) 09/30/2013   Cough 12/10/2012   OA (osteoarthritis) of knee 01/05/2012   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 08/02/2011   Vaginal discharge 08/02/2011   Spasm of lumbar paraspinous muscle 06/17/2011   Morbidly obese ([place]) 05/31/2011   HIP PAIN, LEFT, CHRONIC 11/09/2010   CARPAL TUNNEL SYNDROME, RIGHT 10/14/2010   KNEE PAIN, RIGHT, CHRONIC 10/14/2010   LBBB (left bundle branch block) 23/76/2831   HELICOBACTER PYLORI GASTRITIS 07/18/2010   FATIGUE 07/01/2010   CHEST PAIN, ATYPICAL 07/01/2010   EPIGASTRIC PAIN 07/01/2010   SUPRAPUBIC PAIN 07/01/2010   HYPERGLYCEMIA 07/01/2010   CELLULITIS AND ABSCESS OF OTHER SPECIFIED  SITE 02/15/2010   LUMP OR MASS IN BREAST 01/10/2010   DYSURIA, CHRONIC 03/09/2009   GERD 10/06/2008   Personal history of colonic polyps-adenoma 08/26/2008   LOC OSTEOARTHROS NOT SPEC PRIM/SEC LOWER LEG 09/10/2007   GOUT, UNSPECIFIED 09/09/2007   DIVERTICULOSIS, COLON 09/09/2007   DIVERTICULITIS, HX OF 09/09/2007   HYPERLIPIDEMIA 06/03/2007   Essential hypertension 06/03/2007    Current Outpatient Medications on File Prior to Visit  Medication Sig Dispense Refill   allopurinol (ZYLOPRIM) 300 MG tablet Take 1 tablet by mouth daily 90 tablet 1   aspirin EC 81 MG tablet Take 1 tablet (81 mg total) by mouth daily. 90 tablet 1   atenolol-chlorthalidone (TENORETIC) 50-25 MG tablet Take 1 tablet by mouth daily. 90 tablet 1   benzonatate (TESSALON PERLES) 100 MG capsule Take 1 capsule (100 mg total) by mouth 3 (three) times daily as needed for cough. 30 capsule 1   Blood Pressure KIT 1 Units by Does not apply route daily. Check blood pressure daily for hypertension dx code I10 1 kit 0   Dulaglutide (TRULICITY) 5.17 OH/6.0VP SOPN Inject 0.5 mg into the skin every 14 (fourteen) days.     Lancets (ONETOUCH DELICA PLUS [id number]) MISC USE TO CHECK BLOOD SUGAR TWICE DAILY 100 each 3   Magnesium 250 MG TABS Take 1 tablet (250 mg total) by mouth every evening. Take with dinner meal 90 tablet 1   Multiple Vitamin (MULTIVITAMIN) tablet Take 1 tablet by mouth daily. 90 tablet 1   Multiple Vitamins-Minerals (ZINC PO) Take 1 tablet by mouth as needed.  ONETOUCH VERIO test strip USE TO TEST 2 TIMES DAILY 100 strip 3   rosuvastatin (CRESTOR) 20 MG tablet TAKE 1 TABLET(20 MG) BY MOUTH DAILY 90 tablet 1   Saccharomyces boulardii (PROBIOTIC) 250 MG CAPS 1 tablet by mouth daily 90 capsule 1   valsartan (DIOVAN) 80 MG tablet Take 1 tablet (80 mg total) by mouth daily. 90 tablet 3   vitamin B-12 (CYANOCOBALAMIN) 100 MCG tablet Take 1 tablet by mouth daily 90 tablet 1   Vitamin D, Ergocalciferol, (DRISDOL) 1.25  MG (50000 UNIT) CAPS capsule TAKE 1 CAPSULE BY MOUTH TWICE WEEKLY ON TUESDAYS AND FRIDAYS 26 capsule 0   No current facility-administered medications on file prior to visit.    Allergies  Allergen Reactions   Atorvastatin Calcium Itching   Bee Venom Hives   Oxycodone Other (See Comments)    hallucinations   Vicodin [Hydrocodone-Acetaminophen] Other (See Comments)    hallucinations    Review of Systems No fevers, chills, nausea, muscle aches, no difficulty breathing, no calf pain, no chest pain or shortness of breath.   Physical Exam  GENERAL APPEARANCE: Alert, conversant. Appropriately groomed. No acute distress.   VASCULAR: Pedal pulses palpable 2/40DP and unable to palpate PT bilateral due to edema.  Capillary refill time is immediate to all digits,  Proximal to distal cooling it warm to warm.  Digital perfusion adequate.   NEUROLOGIC: sensation is intact to 5.07 monofilament at 5/5 sites bilateral.  Light touch is intact bilateral, vibratory sensation intact bilateral  MUSCULOSKELETAL: acceptable muscle strength, tone and stability bilateral.  No gross boney pedal deformities noted.  No pain, crepitus or limitation noted with foot and ankle range of motion bilateral.   DERMATOLOGIC: skin is warm, supple, and dry.  No open lesions noted.  No rash, no pre ulcerative lesions. Digital nails are thick, discolored, dystrophic, brittle with subungual debris present and clinically mycotic x 10.       Assessment     ICD-10-CM   1. Pain due to onychomycosis of nail  B35.1    M79.609        Plan  Debridement of toenails was recommended.  Onychoreduction of symptomatic toenails was performed via nail nipper and power burr without iatrogenic incident.  Patient was instructed on signs and symptoms of infection and was told to call immediately should any of these arise.  Recommended follow up in 3 months or instructed to call sooner if any pedal concerns arise.

## 2021-11-23 DIAGNOSIS — R109 Unspecified abdominal pain: Secondary | ICD-10-CM | POA: Diagnosis not present

## 2021-11-23 DIAGNOSIS — G8929 Other chronic pain: Secondary | ICD-10-CM | POA: Diagnosis not present

## 2021-11-24 ENCOUNTER — Encounter: Payer: Self-pay | Admitting: Podiatrist

## 2021-11-25 ENCOUNTER — Emergency Department (HOSPITAL_BASED_OUTPATIENT_CLINIC_OR_DEPARTMENT_OTHER)
Admission: EM | Admit: 2021-11-25 | Discharge: 2021-11-26 | Disposition: A | Discharge status: Home / Self Care | Payer: Medicare Other | Attending: Emergency Medicine | Admitting: Emergency Medicine

## 2021-11-25 ENCOUNTER — Encounter (HOSPITAL_BASED_OUTPATIENT_CLINIC_OR_DEPARTMENT_OTHER): Payer: Self-pay

## 2021-11-25 ENCOUNTER — Emergency Department (HOSPITAL_BASED_OUTPATIENT_CLINIC_OR_DEPARTMENT_OTHER): Payer: Medicare Other

## 2021-11-25 ENCOUNTER — Other Ambulatory Visit: Payer: Self-pay

## 2021-11-25 DIAGNOSIS — I700 Atherosclerosis of aorta: Secondary | ICD-10-CM | POA: Diagnosis not present

## 2021-11-25 DIAGNOSIS — N390 Urinary tract infection, site not specified: Secondary | ICD-10-CM | POA: Diagnosis not present

## 2021-11-25 DIAGNOSIS — K5730 Diverticulosis of large intestine without perforation or abscess without bleeding: Secondary | ICD-10-CM | POA: Diagnosis not present

## 2021-11-25 DIAGNOSIS — M5431 Sciatica, right side: Secondary | ICD-10-CM

## 2021-11-25 DIAGNOSIS — K8020 Calculus of gallbladder without cholecystitis without obstruction: Secondary | ICD-10-CM | POA: Diagnosis not present

## 2021-11-25 DIAGNOSIS — Z7982 Long term (current) use of aspirin: Secondary | ICD-10-CM | POA: Diagnosis not present

## 2021-11-25 DIAGNOSIS — M5441 Lumbago with sciatica, right side: Secondary | ICD-10-CM | POA: Diagnosis not present

## 2021-11-25 DIAGNOSIS — R109 Unspecified abdominal pain: Secondary | ICD-10-CM | POA: Diagnosis not present

## 2021-11-25 LAB — COMPREHENSIVE METABOLIC PANEL
ALT: 31 U/L (ref 0–44)
AST: 33 U/L (ref 15–41)
Albumin: 4.1 g/dL (ref 3.5–5.0)
Alkaline Phosphatase: 41 U/L (ref 38–126)
Anion gap: 9 (ref 5–15)
BUN: 27 mg/dL — ABNORMAL HIGH (ref 8–23)
CO2: 27 mmol/L (ref 22–32)
Calcium: 9.6 mg/dL (ref 8.9–10.3)
Chloride: 102 mmol/L (ref 98–111)
Creatinine, Ser: 1.19 mg/dL — ABNORMAL HIGH (ref 0.44–1.00)
GFR, Estimated: 47 mL/min — ABNORMAL LOW (ref >60)
Glucose, Bld: 100 mg/dL — ABNORMAL HIGH (ref 70–99)
Potassium: 3.7 mmol/L (ref 3.5–5.1)
Sodium: 138 mmol/L (ref 135–145)
Total Bilirubin: 0.8 mg/dL (ref 0.3–1.2)
Total Protein: 7.3 g/dL (ref 6.5–8.1)

## 2021-11-25 LAB — CBC
HCT: 42.9 % (ref 36.0–46.0)
Hemoglobin: 14 g/dL (ref 12.0–15.0)
MCH: 29.9 pg (ref 26.0–34.0)
MCHC: 32.6 g/dL (ref 30.0–36.0)
MCV: 91.7 fL (ref 80.0–100.0)
Platelets: 222 10*3/uL (ref 150–400)
RBC: 4.68 MIL/uL (ref 3.87–5.11)
RDW: 13.7 % (ref 11.5–15.5)
WBC: 10.7 10*3/uL — ABNORMAL HIGH (ref 4.0–10.5)
nRBC: 0 % (ref 0.0–0.2)

## 2021-11-25 LAB — URINALYSIS, ROUTINE W REFLEX MICROSCOPIC
Bilirubin Urine: NEGATIVE
Glucose, UA: NEGATIVE mg/dL
Ketones, ur: NEGATIVE mg/dL
Nitrite: NEGATIVE
Specific Gravity, Urine: 1.013 (ref 1.005–1.030)
WBC, UA: >50 WBC/hpf — ABNORMAL HIGH (ref 0–5)
pH: 6 (ref 5.0–8.0)

## 2021-11-25 LAB — LIPASE, BLOOD: Lipase: 27 U/L (ref 11–51)

## 2021-11-25 IMAGING — CT CT RENAL STONE PROTOCOL
2 of 4 series · 16 of 46 positions shown, 18 images · non-contrast
Comparison: CT abdomen and pelvis [DATE]. MRI abdomen
[DATE].

CLINICAL DATA: Flank pain.



[Series 2: stone full · axial · 0.82mm/px · z∈[-628,-173]mm · 13 of 101 slices shown, 15 images]
[im 5/101  soft-tissue]
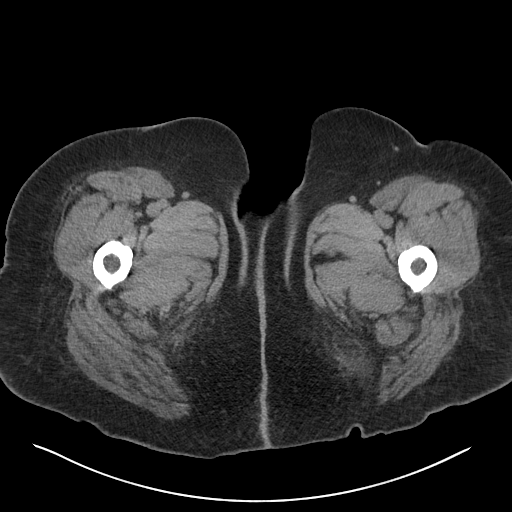
[im 5/101  bone]
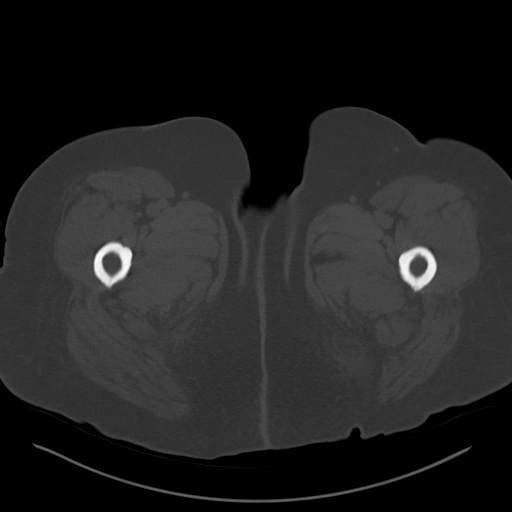
[im 14/101  soft-tissue]
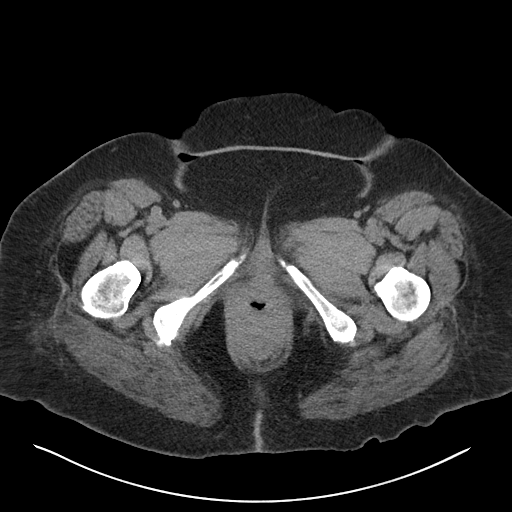
[im 22/101  soft-tissue]
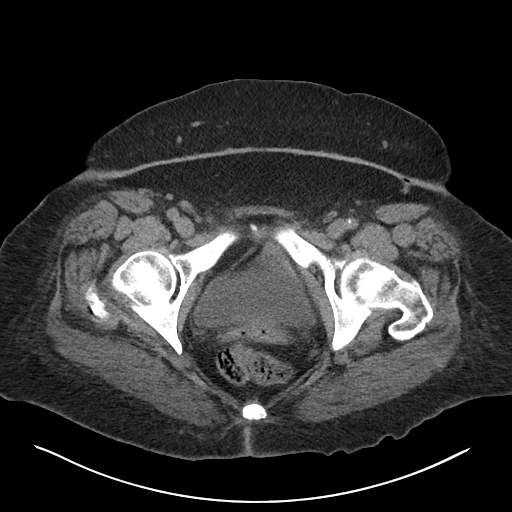
[im 27/101  soft-tissue]
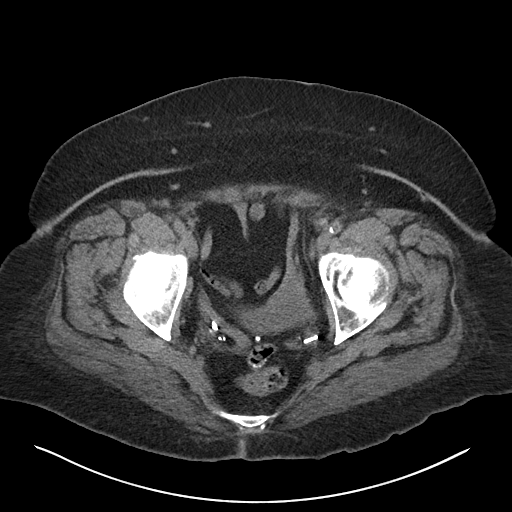
[im 35/101  soft-tissue]
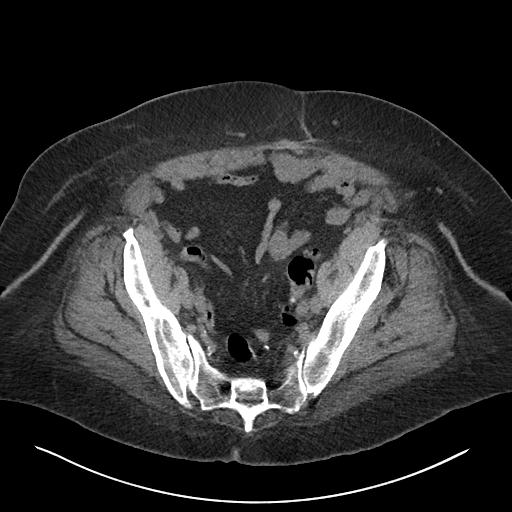
[im 44/101  soft-tissue]
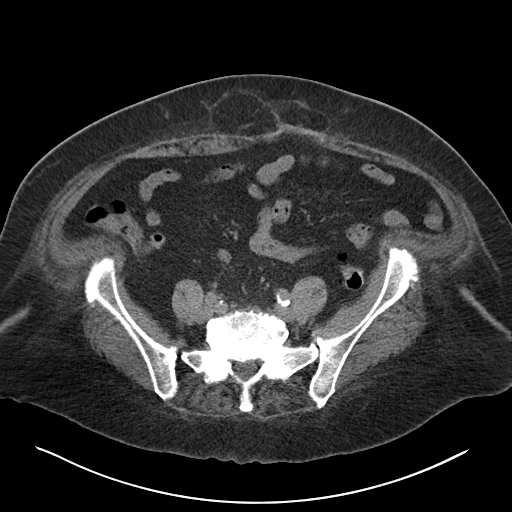
[im 53/101  soft-tissue]
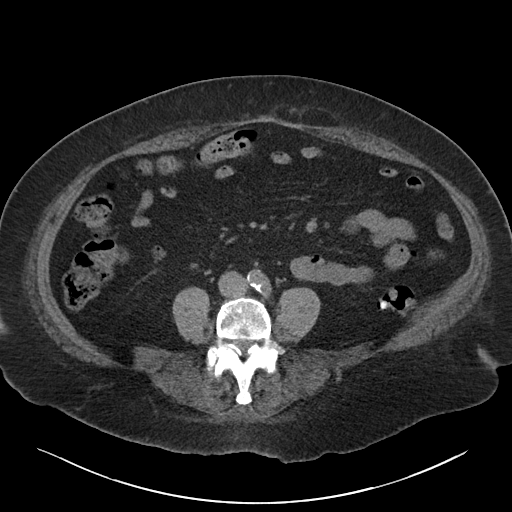
[im 57/101  soft-tissue]
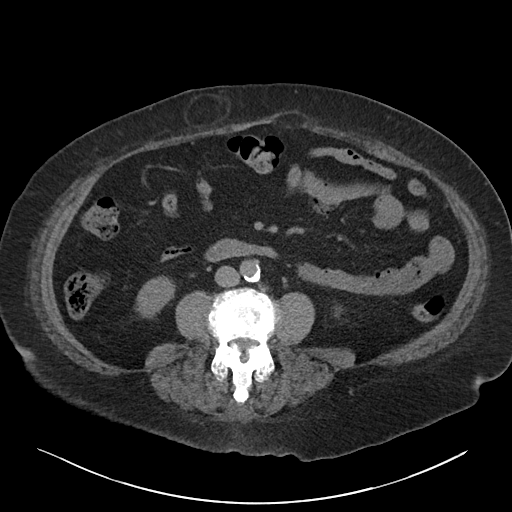
[im 66/101  soft-tissue]
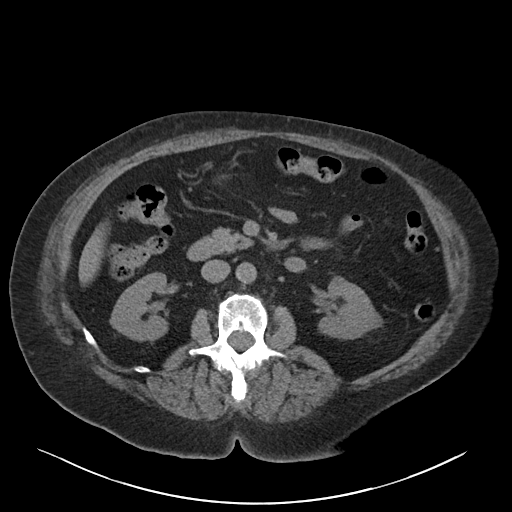
[im 66/101  bone]
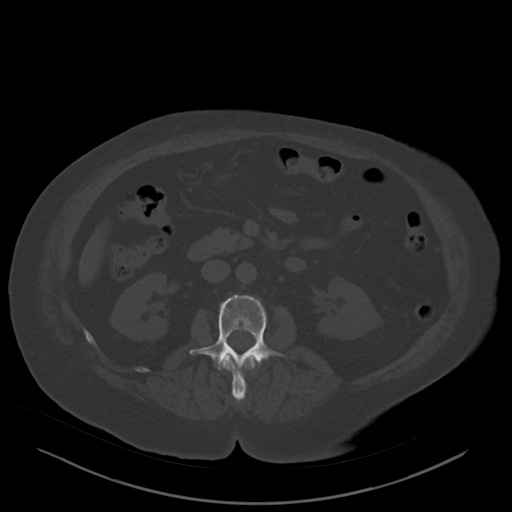
[im 74/101  soft-tissue]
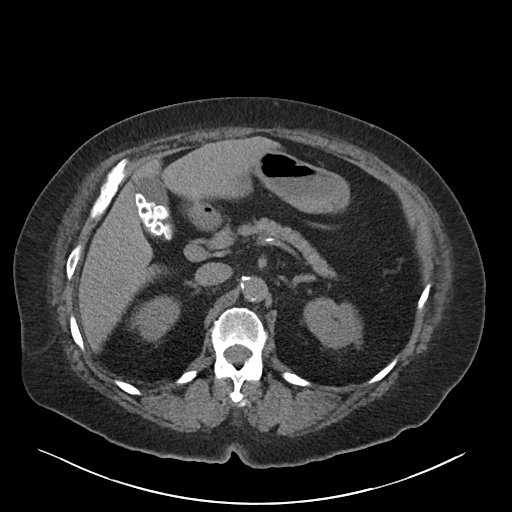
[im 79/101  soft-tissue]
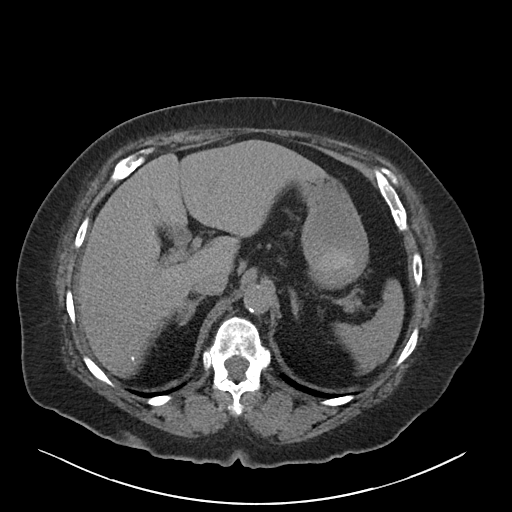
[im 87/101  soft-tissue]
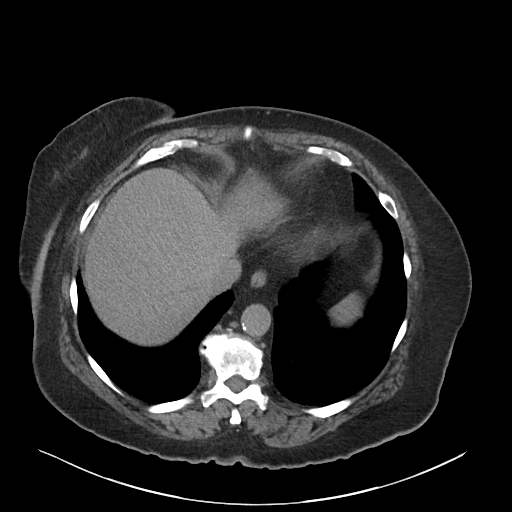
[im 96/101  soft-tissue]
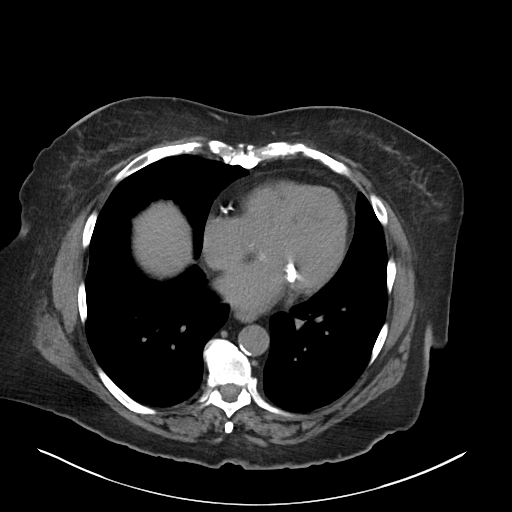

[Series 5: coronal · coronal · 0.92mm/px · 3 of 128 slices shown]
[im 43/128  soft-tissue]
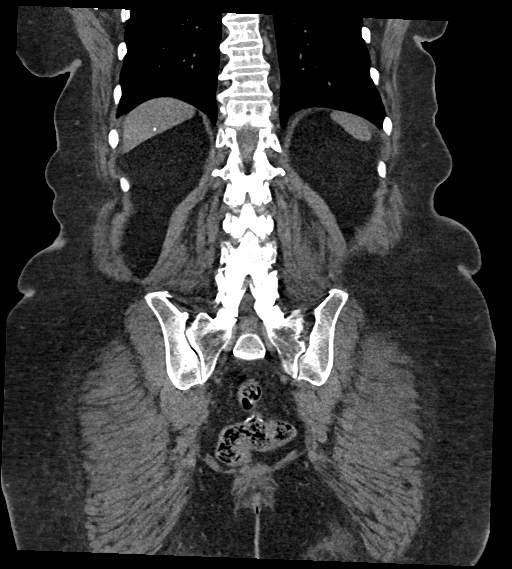
[im 57/128  soft-tissue]
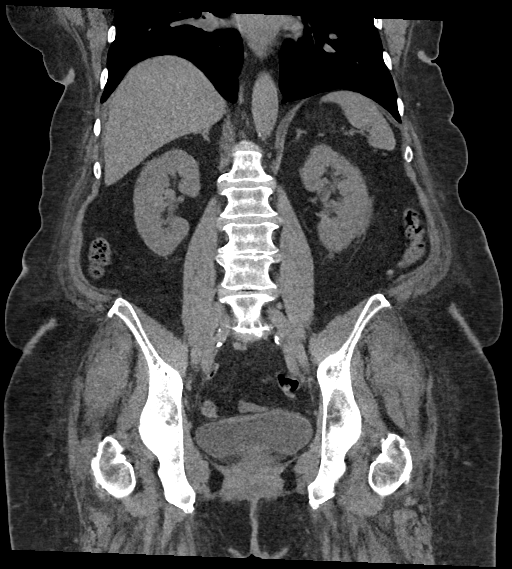
[im 71/128  soft-tissue]
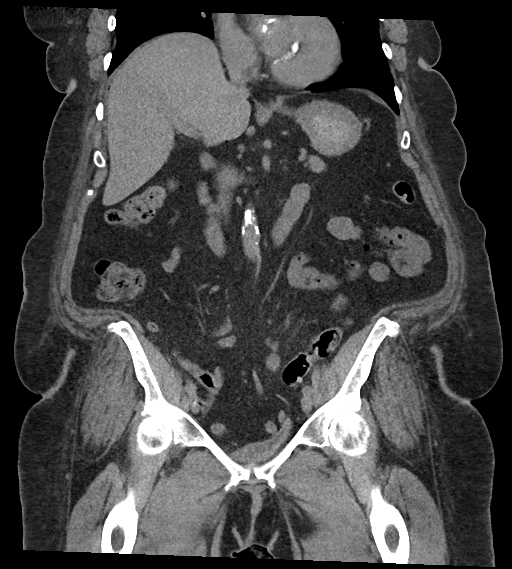

[16 of 46 positions shown; findings below may reference images not displayed]

FINDINGS: Lower chest: No acute abnormality.

Hepatobiliary: Multiple gallstones are present. There is no
pericholecystic inflammation. There is no biliary ductal dilatation.
There are calcified granulomas in the liver. Liver is otherwise
within normal limits.

Pancreas: Unremarkable. No pancreatic ductal dilatation or
surrounding inflammatory changes.

Spleen: Normal in size without focal abnormality.

Adrenals/Urinary Tract: Adrenal glands are unremarkable. Kidneys are
normal, without renal calculi, focal lesion, or hydronephrosis.
Bladder is unremarkable.

Stomach/Bowel: Stomach is within normal limits. Appendix appears
normal. No evidence of bowel wall thickening, distention, or
inflammatory changes. Sigmoid colon anastomosis is present. There is
sigmoid and descending colon diverticulosis without evidence for
acute diverticulitis.

Vascular/Lymphatic: Aortic atherosclerosis. No enlarged abdominal or
pelvic lymph nodes.

Reproductive: Status post hysterectomy. No adnexal masses.

Other: There are several small fat containing ventral hernias which
appears similar to the prior study. No ascites or free air.

Musculoskeletal: Multilevel degenerative changes affect the spine.
IMPRESSION: 1. No urinary tract calculus or hydronephrosis.
2. Cholelithiasis.
3. Colonic diverticulosis without evidence for diverticulitis.
4. Stable fat containing ventral hernias.
5.  Aortic Atherosclerosis ([4M]-[4M]).

## 2021-11-25 MED ORDER — ACETAMINOPHEN 500 MG PO TABS
1000.0000 mg | ORAL_TABLET | Freq: Once | ORAL | Status: AC
  Administered 2021-11-25: 1000 mg via ORAL
  Filled 2021-11-25: qty 2

## 2021-11-25 MED ORDER — CEFTRIAXONE SODIUM 1 G IJ SOLR
1.0000 g | Freq: Once | INTRAMUSCULAR | Status: AC
Start: 2021-11-25 — End: 2021-11-25
  Administered 2021-11-25: 1 g via INTRAMUSCULAR
  Filled 2021-11-25: qty 10

## 2021-11-25 NOTE — ED Provider Notes (Signed)
[facility] EMERGENCY DEPT Provider Note   CSN: [id number] Arrival date & time: 11/25/21  1817     History  Chief Complaint  Patient presents with   Abdominal Pain    [name] is a 81 y.o. female.  The history is provided by the patient.  Flank Pain This is a chronic problem. The current episode started more than 1 week ago. The problem occurs constantly. The problem has not changed since onset.Pertinent negatives include no chest pain, no headaches and no shortness of breath. Nothing aggravates the symptoms. Nothing relieves the symptoms. She has tried nothing for the symptoms. The treatment provided no relief.  Pain in the flank with urinary symptoms for 3 months.  Worse with movement and radiates down her leg, not food related.  Sent in by urology for biliary colic.  No n/v/d.      Home Medications Prior to Admission medications   Medication Sig Start Date End Date Taking? Authorizing Provider  allopurinol (ZYLOPRIM) 300 MG tablet Take 1 tablet by mouth daily 08/18/21   [name], MD  aspirin EC 81 MG tablet Take 1 tablet (81 mg total) by mouth daily. 08/18/21   [name], MD  atenolol-chlorthalidone (TENORETIC) 50-25 MG tablet Take 1 tablet by mouth daily. 11/07/21   [name], MD  benzonatate (TESSALON PERLES) 100 MG capsule Take 1 capsule (100 mg total) by mouth 3 (three) times daily as needed for cough. 11/07/21 11/07/22  [name], FNP  Blood Pressure KIT 1 Units by Does not apply route daily. Check blood pressure daily for hypertension dx code I10 09/14/21   Skeet Latch, MD  Dulaglutide (TRULICITY) 5.80 DX/8.3JA SOPN Inject 0.5 mg into the skin every 14 (fourteen) days.    [provider]  Lancets (ONETOUCH DELICA PLUS [id number]) [place] USE TO CHECK BLOOD SUGAR TWICE DAILY 12/24/20   [name], MD  Magnesium 250 MG TABS Take 1 tablet (250 mg total) by mouth every evening. Take with dinner meal 08/18/21   [name], MD   Multiple Vitamin (MULTIVITAMIN) tablet Take 1 tablet by mouth daily. 08/18/21   [name], MD  Multiple Vitamins-Minerals (ZINC PO) Take 1 tablet by mouth as needed.    [provider]  [facility] VERIO test strip USE TO TEST 2 TIMES DAILY 12/20/20   [name], MD  rosuvastatin (CRESTOR) 20 MG tablet TAKE 1 TABLET(20 MG) BY MOUTH DAILY 09/06/21   [name], MD  Saccharomyces boulardii (PROBIOTIC) 250 MG CAPS 1 tablet by mouth daily 08/18/21   [name], MD  valsartan (DIOVAN) 80 MG tablet Take 1 tablet (80 mg total) by mouth daily. 09/14/21   Skeet Latch, MD  vitamin B-12 (CYANOCOBALAMIN) 100 MCG tablet Take 1 tablet by mouth daily 08/18/21   [name], MD  Vitamin D, Ergocalciferol, (DRISDOL) 1.25 MG (50000 UNIT) CAPS capsule TAKE 1 CAPSULE BY MOUTH TWICE WEEKLY ON TUESDAYS AND FRIDAYS 08/25/21   [name], MD      Allergies    Atorvastatin calcium, Bee venom, Oxycodone, and Vicodin [hydrocodone-acetaminophen]    Review of Systems   Review of Systems  Constitutional:  Negative for fever.  HENT:  Negative for congestion.   Eyes:  Negative for redness.  Respiratory:  Negative for shortness of breath.   Cardiovascular:  Negative for chest pain.  Gastrointestinal:  Negative for vomiting.  Genitourinary:  Positive for flank pain.  Musculoskeletal:  Negative for neck pain.  Skin:  Negative for rash.  Neurological:  Negative for  headaches.  All other systems reviewed and are negative.  Physical Exam Updated Vital Signs BP (!) 143/55 (BP Location: Right Arm)    Pulse 60    Temp 97.7 F (36.5 C)    Resp 18    Ht _0  (1.676 m)    Wt 110.2 kg    SpO2 100%    BMI 39.22 kg/m  Physical Exam Vitals and nursing note reviewed.  Constitutional:      General: She is not in acute distress.    Appearance: Normal appearance.  HENT:     Head: Normocephalic and atraumatic.     Nose: Nose normal.  Eyes:     Conjunctiva/sclera: Conjunctivae normal.      Pupils: Pupils are equal, round, and reactive to light.  Cardiovascular:     Rate and Rhythm: Normal rate and regular rhythm.     Pulses: Normal pulses.     Heart sounds: Normal heart sounds.  Pulmonary:     Effort: Pulmonary effort is normal.     Breath sounds: Normal breath sounds.  Abdominal:     General: Abdomen is flat. Bowel sounds are normal.     Palpations: Abdomen is soft.     Tenderness: There is no abdominal tenderness. There is no guarding or rebound. Negative signs include Murphy's sign, Rovsing's sign and McBurney's sign.  Musculoskeletal:        General: Normal range of motion.     Cervical back: Normal range of motion and neck supple.  Skin:    General: Skin is warm and dry.     Capillary Refill: Capillary refill takes less than 2 seconds.  Neurological:     General: No focal deficit present.     Mental Status: She is alert and oriented to person, place, and time.     Deep Tendon Reflexes: Reflexes normal.  Psychiatric:        Mood and Affect: Mood normal.        Behavior: Behavior normal.    ED Results / Procedures / Treatments   Labs (all labs ordered are listed, but only abnormal results are displayed) Results for orders placed or performed during the hospital encounter of 11/25/21  Lipase, blood  Result Value Ref Range   Lipase 27 11 - 51 U/L  Comprehensive metabolic panel  Result Value Ref Range   Sodium 138 135 - 145 mmol/L   Potassium 3.7 3.5 - 5.1 mmol/L   Chloride 102 98 - 111 mmol/L   CO2 27 22 - 32 mmol/L   Glucose, Bld 100 (H) 70 - 99 mg/dL   BUN 27 (H) 8 - 23 mg/dL   Creatinine, Ser 1.19 (H) 0.44 - 1.00 mg/dL   Calcium 9.6 8.9 - 10.3 mg/dL   Total Protein 7.3 6.5 - 8.1 g/dL   Albumin 4.1 3.5 - 5.0 g/dL   AST 33 15 - 41 U/L   ALT 31 0 - 44 U/L   Alkaline Phosphatase 41 38 - 126 U/L   Total Bilirubin 0.8 0.3 - 1.2 mg/dL   GFR, Estimated 47 (L) >60 mL/min   Anion gap 9 5 - 15  CBC  Result Value Ref Range   WBC 10.7 (H) 4.0 - 10.5 K/uL    RBC 4.68 3.87 - 5.11 MIL/uL   Hemoglobin 14.0 12.0 - 15.0 g/dL   HCT 42.9 36.0 - 46.0 %   MCV 91.7 80.0 - 100.0 fL   MCH 29.9 26.0 - 34.0 pg   MCHC 32.6 30.0 -  36.0 g/dL   RDW 13.7 11.5 - 15.5 %   Platelets 222 150 - 400 K/uL   nRBC 0.0 0.0 - 0.2 %  Urinalysis, Routine w reflex microscopic Urine, Clean Catch  Result Value Ref Range   Color, Urine YELLOW YELLOW   APPearance HAZY (A) CLEAR   Specific Gravity, Urine 1.013 1.005 - 1.030   pH 6.0 5.0 - 8.0   Glucose, UA NEGATIVE NEGATIVE mg/dL   Hgb urine dipstick TRACE (A) NEGATIVE   Bilirubin Urine NEGATIVE NEGATIVE   Ketones, ur NEGATIVE NEGATIVE mg/dL   Protein, ur TRACE (A) NEGATIVE mg/dL   Nitrite NEGATIVE NEGATIVE   Leukocytes,Ua LARGE (A) NEGATIVE   RBC / HPF 11-20 0 - 5 RBC/hpf   WBC, UA >50 (H) 0 - 5 WBC/hpf   Bacteria, UA FEW (A) NONE SEEN   Squamous Epithelial / LPF 11-20 0 - 5   Mucus PRESENT    Budding Yeast PRESENT    Non Squamous Epithelial 6-10 (A) NONE SEEN   CT Renal Stone Study  Result Date: 11/25/2021 CLINICAL DATA:  Flank pain. EXAM: CT ABDOMEN AND PELVIS WITHOUT CONTRAST TECHNIQUE: Multidetector CT imaging of the abdomen and pelvis was performed following the standard protocol without IV contrast. RADIATION DOSE REDUCTION: This exam was performed according to the departmental dose-optimization program which includes automated exposure control, adjustment of the mA and/or kV according to patient size and/or use of iterative reconstruction technique. COMPARISON:  CT abdomen and pelvis 02/18/2021. MRI abdomen 03/14/2021. FINDINGS: Lower chest: No acute abnormality. Hepatobiliary: Multiple gallstones are present. There is no pericholecystic inflammation. There is no biliary ductal dilatation. There are calcified granulomas in the liver. Liver is otherwise within normal limits. Pancreas: Unremarkable. No pancreatic ductal dilatation or surrounding inflammatory changes. Spleen: Normal in size without focal abnormality.  Adrenals/Urinary Tract: Adrenal glands are unremarkable. Kidneys are normal, without renal calculi, focal lesion, or hydronephrosis. Bladder is unremarkable. Stomach/Bowel: Stomach is within normal limits. Appendix appears normal. No evidence of bowel wall thickening, distention, or inflammatory changes. Sigmoid colon anastomosis is present. There is sigmoid and descending colon diverticulosis without evidence for acute diverticulitis. Vascular/Lymphatic: Aortic atherosclerosis. No enlarged abdominal or pelvic lymph nodes. Reproductive: Status post hysterectomy. No adnexal masses. Other: There are several small fat containing ventral hernias which appears similar to the prior study. No ascites or free air. Musculoskeletal: Multilevel degenerative changes affect the spine. IMPRESSION: 1. No urinary tract calculus or hydronephrosis. 2. Cholelithiasis. 3. Colonic diverticulosis without evidence for diverticulitis. 4. Stable fat containing ventral hernias. 5.  Aortic Atherosclerosis (ICD10-I70.0). Electronically Signed   By: Ronney Asters M.D.   On: 11/25/2021 23:48    Radiology CT Renal Stone Study  Result Date: 11/25/2021 CLINICAL DATA:  Flank pain. EXAM: CT ABDOMEN AND PELVIS WITHOUT CONTRAST TECHNIQUE: Multidetector CT imaging of the abdomen and pelvis was performed following the standard protocol without IV contrast. RADIATION DOSE REDUCTION: This exam was performed according to the departmental dose-optimization program which includes automated exposure control, adjustment of the mA and/or kV according to patient size and/or use of iterative reconstruction technique. COMPARISON:  CT abdomen and pelvis 02/18/2021. MRI abdomen 03/14/2021. FINDINGS: Lower chest: No acute abnormality. Hepatobiliary: Multiple gallstones are present. There is no pericholecystic inflammation. There is no biliary ductal dilatation. There are calcified granulomas in the liver. Liver is otherwise within normal limits. Pancreas:  Unremarkable. No pancreatic ductal dilatation or surrounding inflammatory changes. Spleen: Normal in size without focal abnormality. Adrenals/Urinary Tract: Adrenal glands are unremarkable. Kidneys are normal,  without renal calculi, focal lesion, or hydronephrosis. Bladder is unremarkable. Stomach/Bowel: Stomach is within normal limits. Appendix appears normal. No evidence of bowel wall thickening, distention, or inflammatory changes. Sigmoid colon anastomosis is present. There is sigmoid and descending colon diverticulosis without evidence for acute diverticulitis. Vascular/Lymphatic: Aortic atherosclerosis. No enlarged abdominal or pelvic lymph nodes. Reproductive: Status post hysterectomy. No adnexal masses. Other: There are several small fat containing ventral hernias which appears similar to the prior study. No ascites or free air. Musculoskeletal: Multilevel degenerative changes affect the spine. IMPRESSION: 1. No urinary tract calculus or hydronephrosis. 2. Cholelithiasis. 3. Colonic diverticulosis without evidence for diverticulitis. 4. Stable fat containing ventral hernias. 5.  Aortic Atherosclerosis (ICD10-I70.0). Electronically Signed   By: Ronney Asters M.D.   On: 11/25/2021 23:48    Procedures Procedures    Medications Ordered in ED Medications  cefTRIAXone (ROCEPHIN) injection 1 g (has no administration in time range)  acetaminophen (TYLENOL) tablet 1,000 mg (has no administration in time range)    ED Course/ Medical Decision Making/ A&P                           Medical Decision Making 3 months of flank and low back pain that radiated down the posterior leg.  No postprandial pain.   Amount and/or Complexity of Data Reviewed Independent Historian: spouse External Data Reviewed: notes. Labs: ordered.    Details: urine consistent with UTI Radiology: ordered.    Details: CT with gall stones but no cholecystitis  Risk OTC drugs. Prescription drug management. Risk Details: Seen  and examined.  If this were cholecystitis, LFTs would be elevated at this point.  Moreover, pain is in the back and runs down the leg and this consistent with sciatica.  Ruled out for Kidney stones.  Patient does have UTI.  Will treat with Keflex, urine cultured.  Given gall stones without cholecystitis patient will be referred to [facility] for outpatient elective removal of the gall bladder.  There is no indication for emergent consultation nor emergent surgery.  Will start tylenol and lidoderm and voltaren gel for sciatica pain.      Final Clinical Impression(s) / ED Diagnoses Return for intractable cough, coughing up blood, fevers > 100.4 unrelieved by medication, shortness of breath, intractable vomiting, chest pain, shortness of breath, weakness, numbness, changes in speech, facial asymmetry, abdominal pain, passing out, Inability to tolerate liquids or food, cough, altered mental status or any concerns. No signs of systemic illness or infection. The patient is nontoxic-appearing on exam and vital signs are within normal limits.  I have reviewed the triage vital signs and the nursing notes. Pertinent labs & imaging results that were available during my care of the patient were reviewed by me and considered in my medical decision making (see chart for details). After history, exam, and medical workup I feel the patient has been appropriately medically screened and is safe for discharge home. Pertinent diagnoses were discussed with the patient. Patient was given return precautions.           Herschel Fleagle, MD 11/26/21 0000

## 2021-11-25 NOTE — ED Notes (Signed)
Patient transported to CT 

## 2021-11-25 NOTE — ED Triage Notes (Signed)
Pt came in POV c/o of abd and recently seen her PCP and they are concerned for gallstones. Pain is still present and has been going on for 56months.

## 2021-11-26 MED ORDER — LIDOCAINE 5 % EX PTCH: 1.0000 | MEDICATED_PATCH | CUTANEOUS | 0 refills | Status: DC

## 2021-11-26 MED ORDER — CEPHALEXIN 500 MG PO CAPS: 500.0000 mg | ORAL_CAPSULE | Freq: Four times a day (QID) | ORAL | 0 refills | Status: DC

## 2021-11-26 MED ORDER — DICLOFENAC SODIUM 1 % EX GEL: 4.0000 g | Freq: Four times a day (QID) | CUTANEOUS | 0 refills | Status: DC

## 2021-11-26 NOTE — ED Notes (Signed)
EMT-P provided AVS using Teachback Method. Patient verbalizes understanding of Discharge Instructions. Opportunity for Questioning and Answers were provided by EMT-P. Patient Discharged from ED.  ? ?

## 2021-11-27 ENCOUNTER — Other Ambulatory Visit: Payer: Self-pay

## 2021-11-27 ENCOUNTER — Telehealth (HOSPITAL_COMMUNITY): Payer: Self-pay | Admitting: Emergency Medicine

## 2021-11-27 ENCOUNTER — Emergency Department (HOSPITAL_COMMUNITY): Payer: Medicare Other

## 2021-11-27 ENCOUNTER — Encounter (HOSPITAL_COMMUNITY): Payer: Self-pay

## 2021-11-27 ENCOUNTER — Emergency Department (HOSPITAL_COMMUNITY)
Admission: EM | Admit: 2021-11-27 | Discharge: 2021-11-27 | Disposition: A | Discharge status: Home / Self Care | Payer: Medicare Other | Attending: Emergency Medicine | Admitting: Emergency Medicine

## 2021-11-27 DIAGNOSIS — M5137 Other intervertebral disc degeneration, lumbosacral region: Secondary | ICD-10-CM | POA: Diagnosis not present

## 2021-11-27 DIAGNOSIS — Z743 Need for continuous supervision: Secondary | ICD-10-CM | POA: Diagnosis not present

## 2021-11-27 DIAGNOSIS — M47814 Spondylosis without myelopathy or radiculopathy, thoracic region: Secondary | ICD-10-CM | POA: Diagnosis not present

## 2021-11-27 DIAGNOSIS — M5430 Sciatica, unspecified side: Secondary | ICD-10-CM

## 2021-11-27 DIAGNOSIS — M5441 Lumbago with sciatica, right side: Secondary | ICD-10-CM | POA: Diagnosis not present

## 2021-11-27 DIAGNOSIS — M48061 Spinal stenosis, lumbar region without neurogenic claudication: Secondary | ICD-10-CM | POA: Insufficient documentation

## 2021-11-27 DIAGNOSIS — Z7982 Long term (current) use of aspirin: Secondary | ICD-10-CM | POA: Insufficient documentation

## 2021-11-27 DIAGNOSIS — N390 Urinary tract infection, site not specified: Secondary | ICD-10-CM | POA: Diagnosis not present

## 2021-11-27 DIAGNOSIS — R109 Unspecified abdominal pain: Secondary | ICD-10-CM | POA: Diagnosis not present

## 2021-11-27 DIAGNOSIS — M5126 Other intervertebral disc displacement, lumbar region: Secondary | ICD-10-CM | POA: Diagnosis not present

## 2021-11-27 LAB — CBC WITH DIFFERENTIAL/PLATELET
Abs Immature Granulocytes: 0.08 10*3/uL — ABNORMAL HIGH (ref 0.00–0.07)
Basophils Absolute: 0.1 10*3/uL (ref 0.0–0.1)
Basophils Relative: 1 %
Eosinophils Absolute: 0.4 10*3/uL (ref 0.0–0.5)
Eosinophils Relative: 4 %
HCT: 41.5 % (ref 36.0–46.0)
Hemoglobin: 14 g/dL (ref 12.0–15.0)
Immature Granulocytes: 1 %
Lymphocytes Relative: 30 %
Lymphs Abs: 3 10*3/uL (ref 0.7–4.0)
MCH: 30.3 pg (ref 26.0–34.0)
MCHC: 33.7 g/dL (ref 30.0–36.0)
MCV: 89.8 fL (ref 80.0–100.0)
Monocytes Absolute: 0.9 10*3/uL (ref 0.1–1.0)
Monocytes Relative: 9 %
Neutro Abs: 5.7 10*3/uL (ref 1.7–7.7)
Neutrophils Relative %: 55 %
Platelets: 205 10*3/uL (ref 150–400)
RBC: 4.62 MIL/uL (ref 3.87–5.11)
RDW: 13.5 % (ref 11.5–15.5)
WBC: 10.1 10*3/uL (ref 4.0–10.5)
nRBC: 0 % (ref 0.0–0.2)

## 2021-11-27 LAB — COMPREHENSIVE METABOLIC PANEL
ALT: 31 U/L (ref 0–44)
AST: 39 U/L (ref 15–41)
Albumin: 3.7 g/dL (ref 3.5–5.0)
Alkaline Phosphatase: 40 U/L (ref 38–126)
Anion gap: 9 (ref 5–15)
BUN: 26 mg/dL — ABNORMAL HIGH (ref 8–23)
CO2: 23 mmol/L (ref 22–32)
Calcium: 9.3 mg/dL (ref 8.9–10.3)
Chloride: 104 mmol/L (ref 98–111)
Creatinine, Ser: 1.08 mg/dL — ABNORMAL HIGH (ref 0.44–1.00)
GFR, Estimated: 52 mL/min — ABNORMAL LOW (ref >60)
Glucose, Bld: 120 mg/dL — ABNORMAL HIGH (ref 70–99)
Potassium: 3.8 mmol/L (ref 3.5–5.1)
Sodium: 136 mmol/L (ref 135–145)
Total Bilirubin: 0.7 mg/dL (ref 0.3–1.2)
Total Protein: 6.8 g/dL (ref 6.5–8.1)

## 2021-11-27 LAB — URINALYSIS, ROUTINE W REFLEX MICROSCOPIC
Bilirubin Urine: NEGATIVE
Glucose, UA: NEGATIVE mg/dL
Hgb urine dipstick: NEGATIVE
Ketones, ur: NEGATIVE mg/dL
Leukocytes,Ua: NEGATIVE
Nitrite: NEGATIVE
Protein, ur: NEGATIVE mg/dL
Specific Gravity, Urine: 1.004 — ABNORMAL LOW (ref 1.005–1.030)
pH: 7 (ref 5.0–8.0)

## 2021-11-27 IMAGING — MR MR THORACIC SPINE W/O CM
6 series · 43 of 48 positions shown · non-contrast
Comparison: None similar

CLINICAL DATA: Acute thoracic myelopathy. Right-sided flank pain
since [REDACTED]

EXAM:
MRI THORACIC SPINE WITHOUT CONTRAST
TECHNIQUE: Multiplanar, multisequence MR imaging of the thoracic spine was
performed. No intravenous contrast was administered.

[Series 16: T1 · sagittal · 4.0mm · 1.72mm/px · 7 of 15 slices shown (1 of 2)]
[im 1/15]
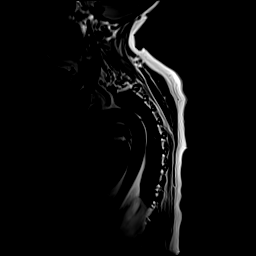
[im 3/15]
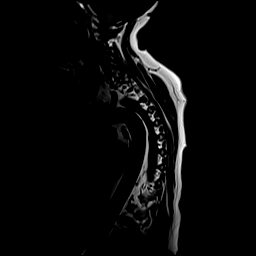
[im 5/15]
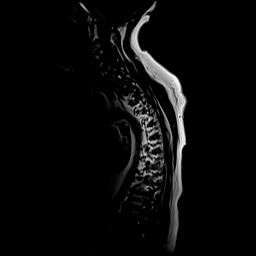
[im 8/15]
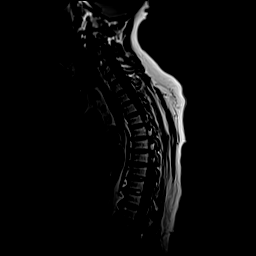
[im 10/15]
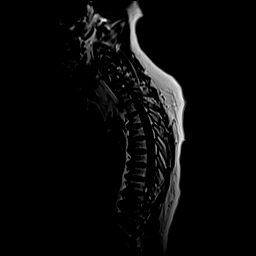
[im 12/15]
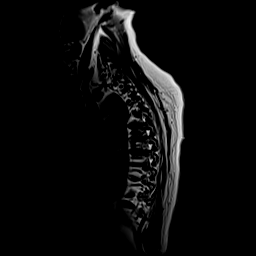
[im 15/15]
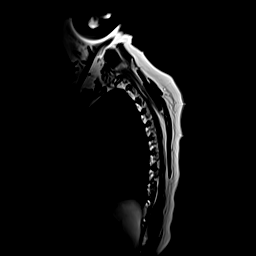

[Series 17: STIR · sagittal · 3.0mm · 1.00mm/px · 8 of 18 slices shown]
[im 1/18]
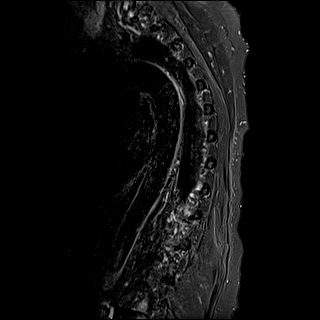
[im 3/18]
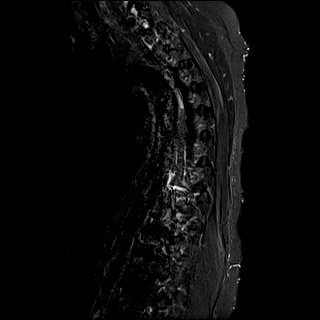
[im 5/18]
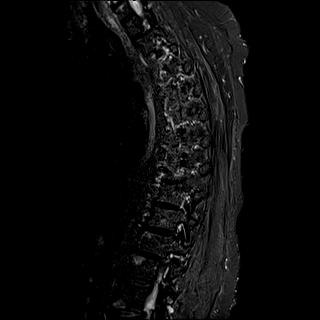
[im 8/18]
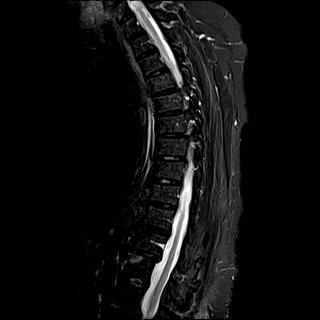
[im 10/18]
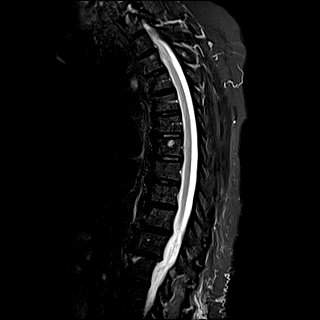
[im 13/18]
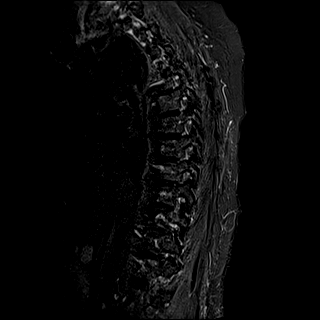
[im 15/18]
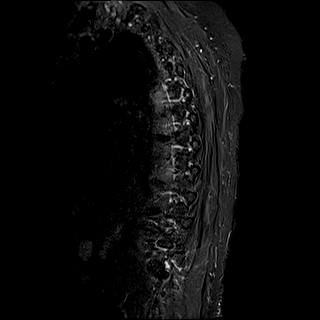
[im 18/18]
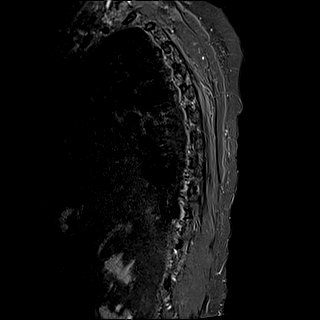

[Series 18: T1 · sagittal · 3.0mm · 1.00mm/px · 8 of 15 slices shown (2 of 2)]
[im 1/15]
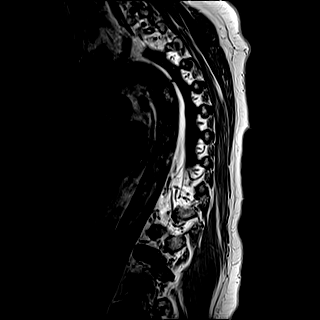
[im 3/15]
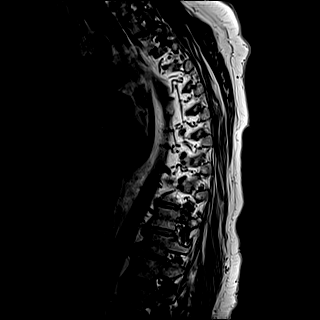
[im 5/15]
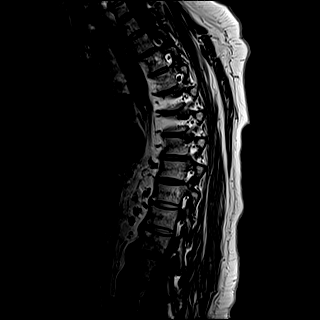
[im 7/15]
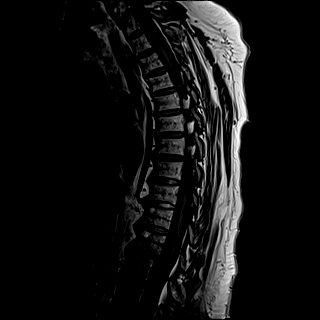
[im 9/15]
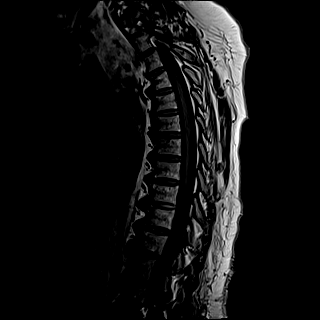
[im 11/15]
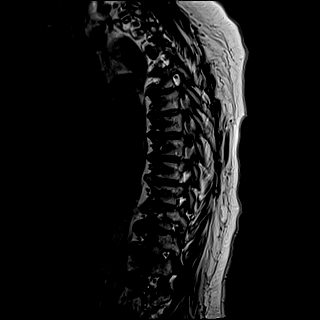
[im 13/15]
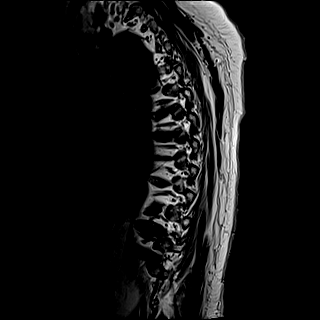
[im 15/15]
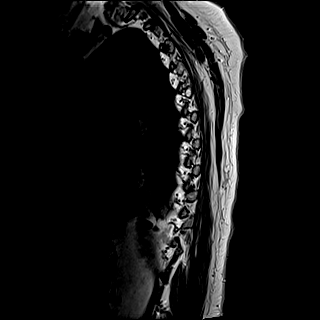

[Series 19: T2 · sagittal · 3.0mm · 0.83mm/px · 9 of 18 slices shown (1 of 2)]
[im 1/18]
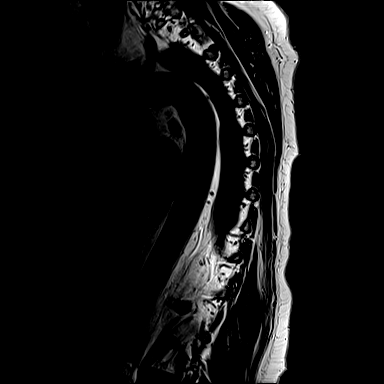
[im 3/18]
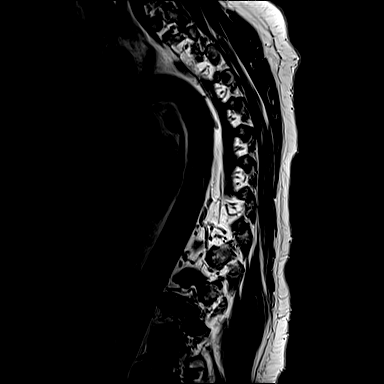
[im 5/18]
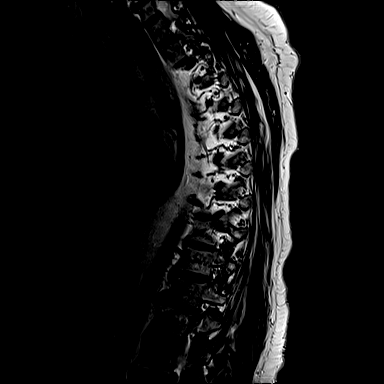
[im 7/18]
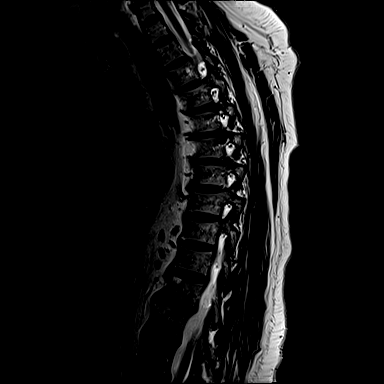
[im 9/18]
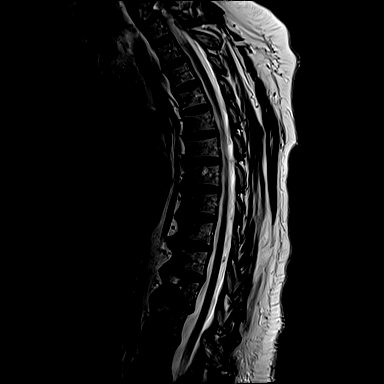
[im 11/18]
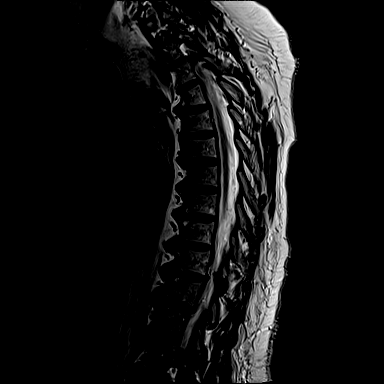
[im 13/18]
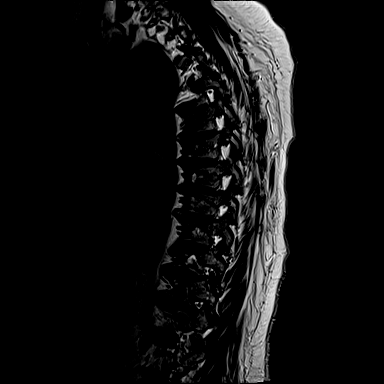
[im 15/18]
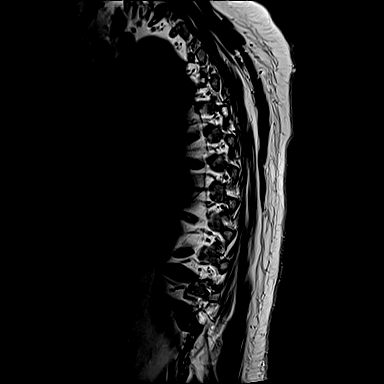
[im 18/18]
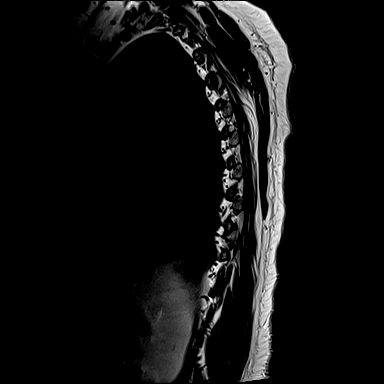

[Series 20: T2 · axial · 4.0mm · 0.78mm/px · z∈[-280,-79]mm · 8 of 15 slices shown (2 of 2)]
[im 1/15]
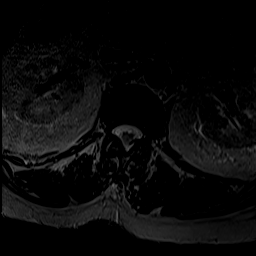
[im 3/15]
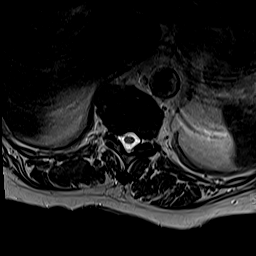
[im 5/15]
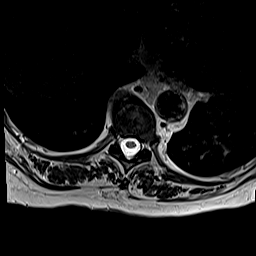
[im 7/15]
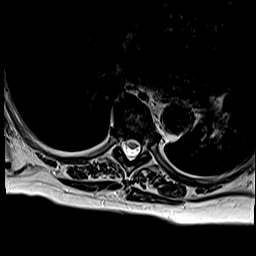
[im 9/15]
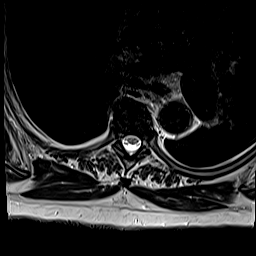
[im 11/15]
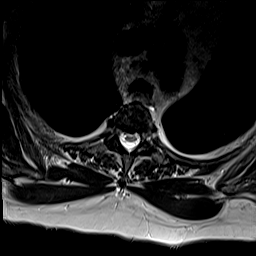
[im 13/15]
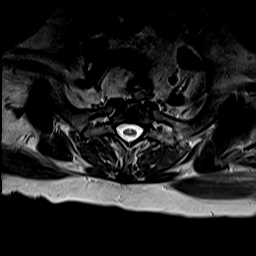
[im 15/15]
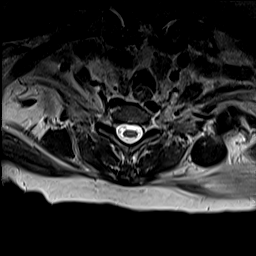

[Series 21: t2_me2d_tra · axial · 4.0mm · 0.39mm/px · z∈[-280,-191]mm · 3 of 15 slices shown]
[im 1/15]
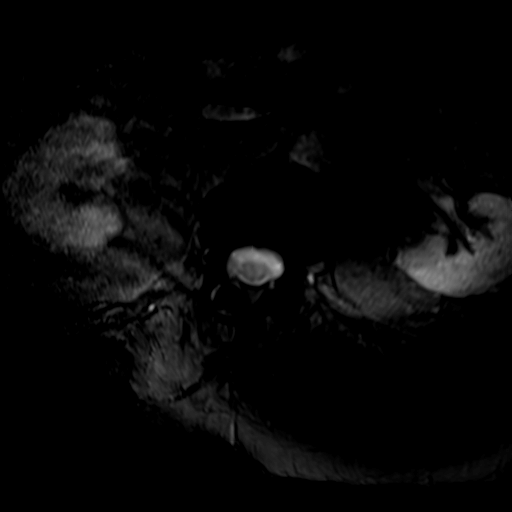
[im 3/15]
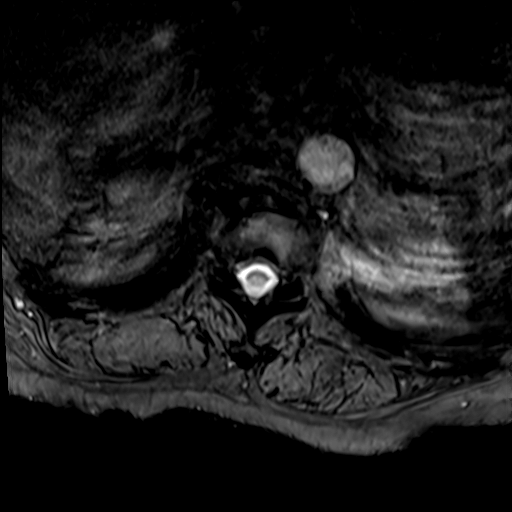
[im 5/15]
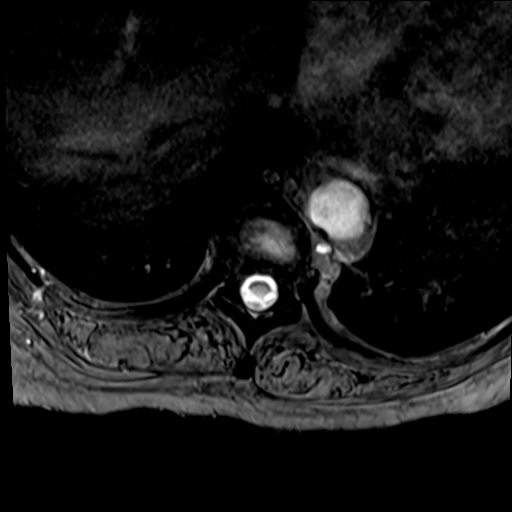

[43 of 48 positions shown; findings below may reference images not displayed]

FINDINGS: Alignment: Exaggerated thoracic kyphosis. Mild retrolisthesis at
T11-12.

Vertebrae: No fracture, evidence of discitis, or bone lesion.

Cord:  Normal signal and morphology.

Paraspinal and other soft tissues: Negative.

Disc levels:

Spondylitic spurring at T4-5 and below with likely bridging
osteophytes at T7-8 and T8-9. Small disc protrusion at T2-3 without
neural contact. Facet spurring on the right at T1-2, T2-3, T3-4,
T10-11, and T11-12. The spurs encroach on the foramina although no
high-grade/focal stenosis.
IMPRESSION: 1. No acute finding. Diffusely patent spinal canal with normal
appearance of the cord.
2. Spondylosis and preferential right-sided facet spurring. No focal
or high-grade foraminal impingement.

## 2021-11-27 MED ORDER — FENTANYL CITRATE PF 50 MCG/ML IJ SOSY
50.0000 ug | PREFILLED_SYRINGE | Freq: Once | INTRAMUSCULAR | Status: AC
  Administered 2021-11-27: 50 ug via INTRAVENOUS
  Filled 2021-11-27: qty 1

## 2021-11-27 MED ORDER — TIZANIDINE HCL 4 MG PO TABS: 2.0000 mg | ORAL_TABLET | Freq: Four times a day (QID) | ORAL | 0 refills | Status: DC | PRN

## 2021-11-27 NOTE — ED Triage Notes (Signed)
Right side flank pain since this past Friday. Was seen at [facility] and diagnosed with gallstones and UTI and was started on antibiotics.  Since that time she has had frequent painful urination continues flank pain.

## 2021-11-27 NOTE — ED Provider Notes (Signed)
MRI of the L and T-spine show mild to moderate this protrusion likely the cause of her sciatic pain.  Recommending continue Tylenol at home as well as lidocaine patches.  We discussed narcotic medicines does not wish to take narcotics.  Advised outpatient follow-up with her doctor within the week, advised immediate return for worsening symptoms worsening pain or any additional concerns.   Luna Fuse, MD 11/27/21 [phone]

## 2021-11-27 NOTE — Discharge Instructions (Signed)
Call your primary care doctor or specialist as discussed in the next 2-3 days.   Return immediately back to the ER if:  Your symptoms worsen within the next 12-24 hours. You develop new symptoms such as new fevers, persistent vomiting, new pain, shortness of breath, or new weakness or numbness, or if you have any other concerns.  

## 2021-11-27 NOTE — ED Provider Notes (Signed)
[place] DEPT Provider Note   CSN: [id number] Arrival date & time: 11/27/21  0501     History  Chief Complaint  Patient presents with   Flank Pain    [name] is a 81 y.o. female.  The history is provided by the patient and medical records.  Flank Pain  [name] is a 81 y.o. female who presents to the Emergency Department complaining of flank pain.  Pain started last Monday.  Pain is stabbing in nature, waxes and wanes.  Pain is worse with movement, especially getting off the toilet.  Does not change with urination.  No fever, vomiting, nausea, diarrhea.  Pain radiates down the RLE, at times down to the toes.    Seen in ED on Friday night - dx with UTI, sciatica, gallstones.    Saw Dr. Gaynelle Arabian on Thursday.  Has experienced urinary incontinence for the last week - intermittent issue since surgery.  No midline back pain.  Currently ambulating with walker, but normally does not need assistance to ambulate.  Last night had episode of bowel incontinence - due to not being able to get to the commode quickly enough.  No numbness.      Home Medications Prior to Admission medications   Medication Sig Start Date End Date Taking? Authorizing Provider  allopurinol (ZYLOPRIM) 300 MG tablet Take 1 tablet by mouth daily 08/18/21   [name], MD  aspirin EC 81 MG tablet Take 1 tablet (81 mg total) by mouth daily. 08/18/21   [name], MD  atenolol-chlorthalidone (TENORETIC) 50-25 MG tablet Take 1 tablet by mouth daily. 11/07/21   [name], MD  benzonatate (TESSALON PERLES) 100 MG capsule Take 1 capsule (100 mg total) by mouth 3 (three) times daily as needed for cough. 11/07/21 11/07/22  [name], FNP  Blood Pressure KIT 1 Units by Does not apply route daily. Check blood pressure daily for hypertension dx code I10 09/14/21   Skeet Latch, MD  cephALEXin (KEFLEX) 500 MG capsule Take 1 capsule (500 mg total) by mouth 4 (four)  times daily. 11/26/21   Palumbo, April, MD  diclofenac Sodium (VOLTAREN) 1 % GEL Apply 4 g topically 4 (four) times daily. Apply to back 11/26/21   Palumbo, April, MD  Dulaglutide (TRULICITY) 3.38 SN/0.5LZ SOPN Inject 0.5 mg into the skin every 14 (fourteen) days.    [provider]  Lancets (ONETOUCH DELICA PLUS [id number]) [place] USE TO CHECK BLOOD SUGAR TWICE DAILY 12/24/20   [name], MD  lidocaine (LIDODERM) 5 % Place 1 patch onto the skin daily. Remove & Discard patch within 12 hours or as directed by MD 11/26/21   Randal Buba, April, MD  Magnesium 250 MG TABS Take 1 tablet (250 mg total) by mouth every evening. Take with dinner meal 08/18/21   [name], MD  Multiple Vitamin (MULTIVITAMIN) tablet Take 1 tablet by mouth daily. 08/18/21   [name], MD  Multiple Vitamins-Minerals (ZINC PO) Take 1 tablet by mouth as needed.    [provider]  [facility] VERIO test strip USE TO TEST 2 TIMES DAILY 12/20/20   [name], MD  rosuvastatin (CRESTOR) 20 MG tablet TAKE 1 TABLET(20 MG) BY MOUTH DAILY 09/06/21   [name], MD  Saccharomyces boulardii (PROBIOTIC) 250 MG CAPS 1 tablet by mouth daily 08/18/21   [name], MD  valsartan (DIOVAN) 80 MG tablet Take 1 tablet (80 mg total) by mouth daily. 09/14/21   Skeet Latch, MD  vitamin B-12 (  CYANOCOBALAMIN) 100 MCG tablet Take 1 tablet by mouth daily 08/18/21   [name], MD  Vitamin D, Ergocalciferol, (DRISDOL) 1.25 MG (50000 UNIT) CAPS capsule TAKE 1 CAPSULE BY MOUTH TWICE WEEKLY ON TUESDAYS AND FRIDAYS 08/25/21   [name], MD      Allergies    Atorvastatin calcium, Bee venom, Oxycodone, and Vicodin [hydrocodone-acetaminophen]    Review of Systems   Review of Systems  Genitourinary:  Positive for flank pain.  All other systems reviewed and are negative.  Physical Exam Updated Vital Signs BP (!) 153/65    Pulse (!) 59    Temp 97.9 F (36.6 C) (Oral)    Resp 18    Ht $R'5\' 6"'xk$  (1.676 m)    Wt 110.2 kg     SpO2 98%    BMI 39.22 kg/m  Physical Exam Vitals and nursing note reviewed.  Constitutional:      Appearance: She is well-developed.  HENT:     Head: Normocephalic and atraumatic.  Cardiovascular:     Rate and Rhythm: Normal rate and regular rhythm.     Heart sounds: No murmur heard. Pulmonary:     Effort: Pulmonary effort is normal. No respiratory distress.     Breath sounds: Normal breath sounds.  Abdominal:     Palpations: Abdomen is soft.     Tenderness: There is no abdominal tenderness. There is no guarding or rebound.  Musculoskeletal:        General: Tenderness present.     Comments: 2+ DP pulses bilaterally.  There is significant tenderness to palpation over the right flank, right thigh, right calf.  There are no overlying rashes or lesions.  She is able to range the hip and knee.  No midline thoracic or lumbar tenderness to palpation  Skin:    General: Skin is warm and dry.  Neurological:     Mental Status: She is alert and oriented to person, place, and time.     Comments: 5 out of 5 strength in bilateral lower extremities with sensation to light touch intact in bilateral lower extremities.  No saddle anesthesia.  Neurologic examination was performed with the patient in the left lateral decubitus position as this is a position of comfort for her.  Psychiatric:        Behavior: Behavior normal.    ED Results / Procedures / Treatments   Labs (all labs ordered are listed, but only abnormal results are displayed) Labs Reviewed  COMPREHENSIVE METABOLIC PANEL - Abnormal; Notable for the following components:      Result Value   Glucose, Bld 120 (*)    BUN 26 (*)    Creatinine, Ser 1.08 (*)    GFR, Estimated 52 (*)    All other components within normal limits  CBC WITH DIFFERENTIAL/PLATELET - Abnormal; Notable for the following components:   Abs Immature Granulocytes 0.08 (*)    All other components within normal limits  URINALYSIS, ROUTINE W REFLEX MICROSCOPIC -  Abnormal; Notable for the following components:   Color, Urine COLORLESS (*)    Specific Gravity, Urine 1.004 (*)    All other components within normal limits  URINE CULTURE    EKG None  Radiology CT Renal Stone Study  Result Date: 11/25/2021 CLINICAL DATA:  Flank pain. EXAM: CT ABDOMEN AND PELVIS WITHOUT CONTRAST TECHNIQUE: Multidetector CT imaging of the abdomen and pelvis was performed following the standard protocol without IV contrast. RADIATION DOSE REDUCTION: This exam was performed according to the departmental dose-optimization program  which includes automated exposure control, adjustment of the mA and/or kV according to patient size and/or use of iterative reconstruction technique. COMPARISON:  CT abdomen and pelvis 02/18/2021. MRI abdomen 03/14/2021. FINDINGS: Lower chest: No acute abnormality. Hepatobiliary: Multiple gallstones are present. There is no pericholecystic inflammation. There is no biliary ductal dilatation. There are calcified granulomas in the liver. Liver is otherwise within normal limits. Pancreas: Unremarkable. No pancreatic ductal dilatation or surrounding inflammatory changes. Spleen: Normal in size without focal abnormality. Adrenals/Urinary Tract: Adrenal glands are unremarkable. Kidneys are normal, without renal calculi, focal lesion, or hydronephrosis. Bladder is unremarkable. Stomach/Bowel: Stomach is within normal limits. Appendix appears normal. No evidence of bowel wall thickening, distention, or inflammatory changes. Sigmoid colon anastomosis is present. There is sigmoid and descending colon diverticulosis without evidence for acute diverticulitis. Vascular/Lymphatic: Aortic atherosclerosis. No enlarged abdominal or pelvic lymph nodes. Reproductive: Status post hysterectomy. No adnexal masses. Other: There are several small fat containing ventral hernias which appears similar to the prior study. No ascites or free air. Musculoskeletal: Multilevel degenerative  changes affect the spine. IMPRESSION: 1. No urinary tract calculus or hydronephrosis. 2. Cholelithiasis. 3. Colonic diverticulosis without evidence for diverticulitis. 4. Stable fat containing ventral hernias. 5.  Aortic Atherosclerosis (ICD10-I70.0). Electronically Signed   By: Ronney Asters M.D.   On: 11/25/2021 23:48    Procedures Procedures    Medications Ordered in ED Medications  fentaNYL (SUBLIMAZE) injection 50 mcg (50 mcg Intravenous Given 11/27/21 2841)    ED Course/ Medical Decision Making/ A&P                           Medical Decision Making Amount and/or Complexity of Data Reviewed Labs: ordered. Radiology: ordered.  Risk Prescription drug management.   Patient here for evaluation of right flank pain that radiates to the right lower extremity.  She does have intact neurologic function and perfusion on examination.  She recently had CT scan, urinalysis performed.  Previous UA was consistent with UTI, there was contamination present on this study.  Repeat analysis today is not consistent with UTI but given current antibiotic use would recommend that this is continued.  BMP with stable renal insufficiency.  Given reports of bowel and bladder incontinence feel that patient needs MRI to further evaluate her symptoms to rule out spinal pathology.  Patient care transferred pending MRI.        Final Clinical Impression(s) / ED Diagnoses Final diagnoses:  None    Rx / DC Orders ED Discharge Orders     None         Quintella Reichert, MD 11/27/21 [phone]

## 2021-11-28 ENCOUNTER — Telehealth: Payer: Self-pay

## 2021-11-28 DIAGNOSIS — M5451 Vertebrogenic low back pain: Secondary | ICD-10-CM | POA: Diagnosis not present

## 2021-11-28 LAB — URINE CULTURE: Culture: NO GROWTH

## 2021-11-28 NOTE — Telephone Encounter (Signed)
Called pt to see how she was doing after ED visit. Pts husband reports she has been to ED twice still dealing with pain,she is also needing assistance. He reports she sees Dr. Wynelle Link for orthopedic, they have not seen him recently, pt will call to make an appointment.

## 2021-11-29 DIAGNOSIS — I129 Hypertensive chronic kidney disease with stage 1 through stage 4 chronic kidney disease, or unspecified chronic kidney disease: Secondary | ICD-10-CM | POA: Diagnosis not present

## 2021-11-29 DIAGNOSIS — E1122 Type 2 diabetes mellitus with diabetic chronic kidney disease: Secondary | ICD-10-CM | POA: Diagnosis not present

## 2021-11-29 DIAGNOSIS — N1831 Chronic kidney disease, stage 3a: Secondary | ICD-10-CM | POA: Diagnosis not present

## 2021-11-30 ENCOUNTER — Other Ambulatory Visit: Payer: Self-pay | Admitting: Internal Medicine

## 2021-11-30 ENCOUNTER — Encounter: Payer: Self-pay | Admitting: Internal Medicine

## 2021-11-30 DIAGNOSIS — G8929 Other chronic pain: Secondary | ICD-10-CM

## 2021-12-05 ENCOUNTER — Ambulatory Visit (HOSPITAL_COMMUNITY)
Admission: RE | Admit: 2021-12-05 | Discharge: 2021-12-05 | Disposition: A | Discharge status: Home / Self Care | Payer: Medicare Other | Source: Ambulatory Visit | Attending: Internal Medicine | Admitting: Internal Medicine

## 2021-12-05 ENCOUNTER — Ambulatory Visit: Payer: Medicare Other | Admitting: Internal Medicine

## 2021-12-05 ENCOUNTER — Telehealth: Payer: Self-pay

## 2021-12-05 ENCOUNTER — Other Ambulatory Visit: Payer: Self-pay

## 2021-12-05 DIAGNOSIS — R1010 Upper abdominal pain, unspecified: Secondary | ICD-10-CM | POA: Diagnosis not present

## 2021-12-05 DIAGNOSIS — R109 Unspecified abdominal pain: Secondary | ICD-10-CM | POA: Insufficient documentation

## 2021-12-05 DIAGNOSIS — G8929 Other chronic pain: Secondary | ICD-10-CM | POA: Diagnosis not present

## 2021-12-05 IMAGING — NM NM HEPATO W/GB/PHARM/[PERSON_NAME]
2 series · 12 of 12 positions shown · non-contrast
Comparison: CT [DATE]

CLINICAL DATA: Chronic upper abdominal pain, assess gallbladder
motility.

EXAM:
NUCLEAR MEDICINE HEPATOBILIARY IMAGING WITH GALLBLADDER EF
TECHNIQUE: Sequential images of the abdomen were obtained [DATE] minutes
following intravenous administration of radiopharmaceutical. After
oral ingestion of Ensure, gallbladder ejection fraction was
determined. At 60 min, normal ejection fraction is greater than 33%.
RADIOPHARMACEUTICALS:  4.9 mCi [KO]  Choletec IV

[Series 1: biliary · 4.14mm/px · 6 of 60 frames shown]
[frame 6/60]
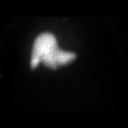
[frame 16/60]
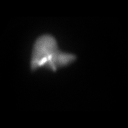
[frame 26/60]
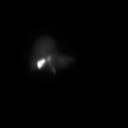
[frame 36/60]
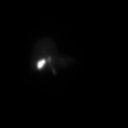
[frame 46/60]
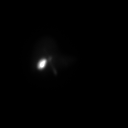
[frame 56/60]
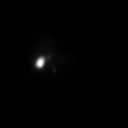

[Series 2: gbef · 4.14mm/px · 6 of 60 frames shown]
[frame 6/60]
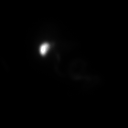
[frame 16/60]
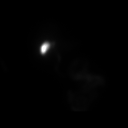
[frame 26/60]
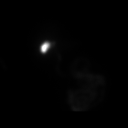
[frame 36/60]
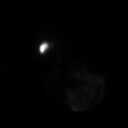
[frame 46/60]
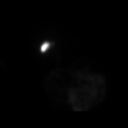
[frame 56/60]
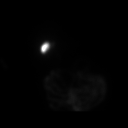

[12 of 12 positions shown; findings below may reference images not displayed]

FINDINGS: There is prompt, uniform radiotracer uptake by the liver with normal
filling of the intrahepatic ducts, common bile duct. Gallbladder
activity is visualized, consistent with patency of cystic duct
(normal < 60 minutes). Additionally there is normal biliary to bowel
transit (normal < 60 minutes), consistent with patent common bile
duct.

Ensure was administered and the gallbladder appears to empty
normally on sequential images. Calculated gallbladder ejection
fraction is 44%. (Normal gallbladder ejection fraction with Ensure
is greater than 33%.)

No evidence of enterogastric biliary reflux.
IMPRESSION: 1.  Patent cystic and common bile ducts.

2.  Normal gallbladder ejection fraction.

## 2021-12-05 MED ORDER — TECHNETIUM TC 99M TETROFOSMIN IV KIT
4.9000 | PACK | Freq: Once | INTRAVENOUS | Status: AC | PRN
  Administered 2021-12-05: 4.9 via INTRAVENOUS

## 2021-12-05 NOTE — Chronic Care Management (AMB) (Signed)
Chronic Care Management Pharmacy Assistant   Name: [name]  MRN: [id number] DOB: 1941-08-14  Reason for Encounter: Medication Review/ Medication coordination  Recent office visits:  11-04-2021 Lynne Logan, RN (CCM)  11-04-2021 [name] (CCM)  11-03-2021 [name], MD. Glucose= 154, Creatinine= 1.10, eGFR= 51. A1C= 6.5. LDL= 113.  Recent consult visits:  11-23-2021 [name], MD (Urology). Visit for incontinence of urine. No changes.   11-17-2021 Bronson Ing, DPM (Podiatry). "Debridement of toenails was recommended.  Onychoreduction of symptomatic toenails was performed via nail nipper and power burr without iatrogenic incident.  Patient was instructed on signs and symptoms of infection and was told to call immediately should any of these arise.  Recommended follow up in 3 months or instructed to call sooner if any pedal concerns arise".   11-15-2021 [name], MD. (Urology). CT ABDOMEN PELVIS [facility])    Hospital visits:  Medication Reconciliation was completed by comparing discharge summary, patients EMR and Pharmacy list, and upon discussion with patient.  Admitted to the hospital on 11-27-2021 due to Sciatic nerve pain Discharge date was 11-27-2021. Discharged from [place]?Medications Started at [facility] Discharge:?? Tizanidine 2 mg every 6 hours as needed  Medication Changes at Hospital Discharge: None  Medications Discontinued at Hospital Discharge: None  Medications that remain the same after Hospital Discharge:??  -All other medications will remain the same.    Hospital visits:  Medication Reconciliation was completed by comparing discharge summary, patients EMR and Pharmacy list, and upon discussion with patient.  Admitted to the hospital on 11-25-2021 due to Sciatic nerve pain Discharge date was 11-26-2021. Discharged from [place]?Medications  Started at [facility] Discharge:?? Cipro 500 mg times daily Voltaren gel 4 grams apply 4 times daily to back. Lidocaine 5 % Place 1 patch onto the skin daily. Remove & Discard patch within 12 hours  Medication Changes at Hospital Discharge: None  Medications Discontinued at Hospital Discharge: None  Medications that remain the same after Hospital Discharge:??  -All other medications will remain the same.    Medications: Outpatient Encounter Medications as of 12/05/2021  Medication Sig   allopurinol (ZYLOPRIM) 300 MG tablet Take 1 tablet by mouth daily   aspirin EC 81 MG tablet Take 1 tablet (81 mg total) by mouth daily.   atenolol-chlorthalidone (TENORETIC) 50-25 MG tablet Take 1 tablet by mouth daily.   benzonatate (TESSALON PERLES) 100 MG capsule Take 1 capsule (100 mg total) by mouth 3 (three) times daily as needed for cough.   Blood Pressure KIT 1 Units by Does not apply route daily. Check blood pressure daily for hypertension dx code I10   cephALEXin (KEFLEX) 500 MG capsule Take 1 capsule (500 mg total) by mouth 4 (four) times daily.   diclofenac Sodium (VOLTAREN) 1 % GEL Apply 4 g topically 4 (four) times daily. Apply to back   Dulaglutide (TRULICITY) 9.67 RF/1.6BW SOPN Inject 0.5 mg into the skin every 14 (fourteen) days.   Lancets (ONETOUCH DELICA PLUS [id number]) MISC USE TO CHECK BLOOD SUGAR TWICE DAILY   lidocaine (LIDODERM) 5 % Place 1 patch onto the skin daily. Remove & Discard patch within 12 hours or as directed by MD   Magnesium 250 MG TABS Take 1 tablet (250 mg total) by mouth every evening. Take with dinner meal   Multiple Vitamin (MULTIVITAMIN) tablet Take 1 tablet by mouth daily.   Multiple Vitamins-Minerals (ZINC PO) Take  1 tablet by mouth as needed.   ONETOUCH VERIO test strip USE TO TEST 2 TIMES DAILY   rosuvastatin (CRESTOR) 20 MG tablet TAKE 1 TABLET(20 MG) BY MOUTH DAILY   Saccharomyces boulardii (PROBIOTIC) 250 MG CAPS 1 tablet by mouth daily   tiZANidine  (ZANAFLEX) 4 MG tablet Take 0.5 tablets (2 mg total) by mouth every 6 (six) hours as needed for muscle spasms.   valsartan (DIOVAN) 80 MG tablet Take 1 tablet (80 mg total) by mouth daily.   vitamin B-12 (CYANOCOBALAMIN) 100 MCG tablet Take 1 tablet by mouth daily   Vitamin D, Ergocalciferol, (DRISDOL) 1.25 MG (50000 UNIT) CAPS capsule TAKE 1 CAPSULE BY MOUTH TWICE WEEKLY ON TUESDAYS AND FRIDAYS   No facility-administered encounter medications on file as of 12/05/2021.  Reviewed chart for medication changes ahead of medication coordination call.  No medication changes indicated OR if recent visit, treatment plan here.  BP Readings from Last 3 Encounters:  11/27/21 (!) 126/55  11/25/21 (!) 143/55  11/03/21 136/74    Lab Results  Component Value Date   HGBA1C 6.5 (H) 11/03/2021     Patient obtains medications through Adherence Packaging  30 Days   Last adherence delivery included:  Valsartan 80 mg at breakfast Rosuvastatin 20 mg- 1 tablet daily (Breakfast) Allopurinol 300 mg- 1 tablet daily (Breakfast) Asa 81 mg- 1 tablet daily (Breakfast) Atenolol-Chlorthalidone 50/25 mg- 1 tablet daily (Breakfast) Multivitamin - 1 tablet daily (Evening meal)  Vitamin D 1.25 mg- 1 tablet twice a week on Tuesdays and Fridays (Breakfast) Probiotic daily- 1 tablet daily (Breakfast) Magnesium 250 mg- 1 tablet daily  (Evening meal) Vitamin B12- 1 tablet daily (Evening meal)  Patient declined (meds) last month: None  Patient is due for next adherence delivery on: 12-15-2021  Called patient and reviewed medications and coordinated delivery.  This delivery to include: Valsartan 80 mg at breakfast Rosuvastatin 20 mg- 1 tablet daily (Breakfast) Allopurinol 300 mg- 1 tablet daily (Breakfast) Asa 81 mg- 1 tablet daily (Breakfast) Atenolol-Chlorthalidone 50/25 mg- 1 tablet daily (Breakfast) Vitamin D 1.25 mg- 1 tablet twice a week on Tuesdays and Fridays (Breakfast)  No acute/short fill  needed  Patient declined the following medications: None  Patient needs refills for: Vit D2  Confirmed delivery date of 12-15-2021 advised patient that pharmacy will contact them the morning of delivery.  Care Gaps: Shingrix overdue   Star Rating Drugs: Rosuvastatin 20 mg- Last filled 11-10-2021 [facility] Valsartan 80 mg- Last filled 11-10-2021 [facility] Trulicity 0.5 mg- Patient assistance  [place] Pharmacist Assistant [phone]

## 2021-12-06 ENCOUNTER — Other Ambulatory Visit: Payer: Self-pay

## 2021-12-06 DIAGNOSIS — R109 Unspecified abdominal pain: Secondary | ICD-10-CM

## 2021-12-07 ENCOUNTER — Telehealth: Payer: Self-pay

## 2021-12-07 ENCOUNTER — Other Ambulatory Visit: Payer: Self-pay

## 2021-12-07 ENCOUNTER — Other Ambulatory Visit: Payer: Medicare Other

## 2021-12-07 DIAGNOSIS — R109 Unspecified abdominal pain: Secondary | ICD-10-CM

## 2021-12-07 MED ORDER — VITAMIN D (ERGOCALCIFEROL) 1.25 MG (50000 UNIT) PO CAPS: ORAL_CAPSULE | ORAL | 0 refills | Status: DC

## 2021-12-07 NOTE — Telephone Encounter (Signed)
The pt and Mr [name] was given her hda scan results informed that Dr. Baird Cancer said that the pt needed to go to the consultation with the surgeon tomorrow.

## 2021-12-08 DIAGNOSIS — R1031 Right lower quadrant pain: Secondary | ICD-10-CM | POA: Diagnosis not present

## 2021-12-08 LAB — LIPASE: Lipase: 26 U/L (ref 14–85)

## 2021-12-08 LAB — AMYLASE: Amylase: 119 U/L — ABNORMAL HIGH (ref 31–110)

## 2021-12-12 ENCOUNTER — Other Ambulatory Visit: Payer: Self-pay | Admitting: Internal Medicine

## 2021-12-12 ENCOUNTER — Other Ambulatory Visit: Payer: Medicare Other

## 2021-12-12 ENCOUNTER — Other Ambulatory Visit: Payer: Self-pay

## 2021-12-12 DIAGNOSIS — R109 Unspecified abdominal pain: Secondary | ICD-10-CM | POA: Diagnosis not present

## 2021-12-12 DIAGNOSIS — G8929 Other chronic pain: Secondary | ICD-10-CM | POA: Diagnosis not present

## 2021-12-13 ENCOUNTER — Telehealth: Payer: Self-pay

## 2021-12-13 LAB — AMYLASE: Amylase: 92 U/L (ref 31–110)

## 2021-12-13 NOTE — Chronic Care Management (AMB) (Signed)
Chronic Care Management Pharmacy Assistant   Name: [name]  MRN: [id number] DOB: [birth year]  Reason for Encounter: Medication Review   12/13/2021-Received a message from [place] told the [facility] that she is supposed to be taking benazepril but we have that the benazepril was discontinued when they sent in the valsartan for her, please advise. Seen where Benazepril was discontinued by Cardiologist on 09/14/2021. Called patient, she is concerned about what medications she should be taking. Patient states she was told her her Nephrologist that she should be on Benazepril and not Amlodipine, patient was confused because she remembers being taken off of Benazepril and put on Valsartan. Patient began to explain the last few weeks of ED visit, Urology visits, General Surgery visit and numerous scan dealing with her Gallbladder. Patient did not like the way some medications made her feel for pain when administer at the ED (Narcan) but she needed it due to the severe pain she was having that sent her to the hospital. Patient stated they also sent her home with Tizanidine but she d not want to take due to all the other medications she was takings but she knew this was a muscle relaxer. Patient would like daughter to be on HIPAA and Mychart to help navigate through labs and appointments.  Reviewed chart informed patient that back in 09/14/2021 Cardiology discontinued her Benazepril and placed her on Valsartan. Patient stated her Nephrologist wrote down the name of the medication (Benazepril) and told patient she should be taking it and should not be taking Amlodipine. Patient is very confused and worried she has been taking the wrong medications for a while. Patient aware I will discuss with Dr. Baird Cancer and [name], CPP on the medication discrepancies and return her call with more information.    12/14/2021- Discussion with Dr Baird Cancer and [name] regarding medication patient should be  on. Notes from [facility] have not been received. Called [facility], requested 11/15/21 office notes to be sent.  12/15/2021- Patient contacted by Jeannette How, [place] regarding the wait on office notes from [place] for providers to discuss. Message left for patient.   Medications: Outpatient Encounter Medications as of 12/13/2021  Medication Sig   allopurinol (ZYLOPRIM) 300 MG tablet Take 1 tablet by mouth daily   aspirin EC 81 MG tablet Take 1 tablet (81 mg total) by mouth daily.   atenolol-chlorthalidone (TENORETIC) 50-25 MG tablet Take 1 tablet by mouth daily.   benzonatate (TESSALON PERLES) 100 MG capsule Take 1 capsule (100 mg total) by mouth 3 (three) times daily as needed for cough.   Blood Pressure KIT 1 Units by Does not apply route daily. Check blood pressure daily for hypertension dx code I10   cephALEXin (KEFLEX) 500 MG capsule Take 1 capsule (500 mg total) by mouth 4 (four) times daily.   diclofenac Sodium (VOLTAREN) 1 % GEL Apply 4 g topically 4 (four) times daily. Apply to back   Dulaglutide (TRULICITY) 8.33 AS/5.0NL SOPN Inject 0.5 mg into the skin every 14 (fourteen) days.   Lancets (ONETOUCH DELICA PLUS [id number]) MISC USE TO CHECK BLOOD SUGAR TWICE DAILY   lidocaine (LIDODERM) 5 % Place 1 patch onto the skin daily. Remove & Discard patch within 12 hours or as directed by MD   Magnesium 250 MG TABS Take 1 tablet (250 mg total) by mouth every evening. Take with dinner meal   Multiple Vitamin (MULTIVITAMIN) tablet Take 1 tablet by mouth daily.   Multiple  Vitamins-Minerals (ZINC PO) Take 1 tablet by mouth as needed.   ONETOUCH VERIO test strip USE TO TEST 2 TIMES DAILY   rosuvastatin (CRESTOR) 20 MG tablet TAKE 1 TABLET(20 MG) BY MOUTH DAILY   Saccharomyces boulardii (PROBIOTIC) 250 MG CAPS 1 tablet by mouth daily   tiZANidine (ZANAFLEX) 4 MG tablet Take 0.5 tablets (2 mg total) by mouth every 6 (six) hours as needed for muscle spasms.    valsartan (DIOVAN) 80 MG tablet Take 1 tablet (80 mg total) by mouth daily.   vitamin B-12 (CYANOCOBALAMIN) 100 MCG tablet Take 1 tablet by mouth daily   Vitamin D, Ergocalciferol, (DRISDOL) 1.25 MG (50000 UNIT) CAPS capsule TAKE 1 CAPSULE BY MOUTH TWICE WEEKLY ON TUESDAYS AND FRIDAYS   No facility-administered encounter medications on file as of 12/13/2021.    Pattricia Boss, [place] Pharmacist Assistant [phone]

## 2021-12-22 DIAGNOSIS — R82998 Other abnormal findings in urine: Secondary | ICD-10-CM | POA: Diagnosis not present

## 2022-01-03 ENCOUNTER — Telehealth: Payer: Self-pay

## 2022-01-03 NOTE — Chronic Care Management (AMB) (Signed)
? ? ?Chronic Care Management ?Pharmacy Assistant  ? ?Name: [name]  MRN: [id number] DOB: 12-31-1940 ? ?Reason for Encounter: Medication Review/ Medication coordination ? ?Recent office visits:  ?11-04-2021 Little, Claudette Stapler, RN (CCM) ? ?11-04-2021 [name] (CCM) ? ?Recent consult visits:  ?12-08-2021 Jesusita Oka, MD ([facility]). Initial visit for gallstones. ? ?11-23-2021 [name], MD (Urology). Follow up visit for flank pain. ? ?11-17-2021 Bronson Ing, DPM (Podiatry). Debridement of toenails was recommended.  Onychoreduction of symptomatic toenails was performed via nail nipper and power burr without iatrogenic incident.  Patient was instructed on signs and symptoms of infection and was told to call immediately should any of these arise.  Recommended follow up in 3 months or instructed to call sooner if any pedal concerns arise". ? ?11-15-2021 [name], MD. Urogram completed. ? ?Hospital visits:  ?Medication Reconciliation was completed by comparing discharge summary, patient?s EMR and Pharmacy list, and upon discussion with patient. ? ?Admitted to the hospital on 11-27-2021 due to Sciatic nerve pain. Discharge date was 11-27-2021. Discharged from [facility].   ? ?New?Medications Started at [facility] Discharge:?? ?Tizanidine 2 mg every 6 hours as needed ? ?Medication Changes at Hospital Discharge: ?None ? ?Medications Discontinued at Hospital Discharge: ?None ? ?Medications that remain the same after Hospital Discharge:??  ?-All other medications will remain the same.  ? ?Hospital visits:  ?Medication Reconciliation was completed by comparing discharge summary, patient?s EMR and Pharmacy list, and upon discussion with patient. ? ?Admitted to the hospital on 11-25-2021 due to . Discharge date was 11-26-2021. Discharged from [facility]. ? ?New?Medications Started at [facility] Discharge:?? ?Keflex 500 mg 4 times  daily ?Diclofenac Sodium 4 g topical 4 times daily apply to back ?Lidocaine 5 % patch apply 1 patch onto the skin daily. Remove & Discard patch within 12 hours. ? ?Medication Changes at Hospital Discharge: ?None ? ?Medications Discontinued at Hospital Discharge: ?None ? ?Medications that remain the same after Hospital Discharge:??  ?-All other medications will remain the same.    ? ?Medications: ?Outpatient Encounter Medications as of 01/03/2022  ?Medication Sig  ? allopurinol (ZYLOPRIM) 300 MG tablet Take 1 tablet by mouth daily  ? aspirin EC 81 MG tablet Take 1 tablet (81 mg total) by mouth daily.  ? atenolol-chlorthalidone (TENORETIC) 50-25 MG tablet Take 1 tablet by mouth daily.  ? benzonatate (TESSALON PERLES) 100 MG capsule Take 1 capsule (100 mg total) by mouth 3 (three) times daily as needed for cough.  ? Blood Pressure KIT 1 Units by Does not apply route daily. Check blood pressure daily for hypertension dx code I10  ? cephALEXin (KEFLEX) 500 MG capsule Take 1 capsule (500 mg total) by mouth 4 (four) times daily.  ? diclofenac Sodium (VOLTAREN) 1 % GEL Apply 4 g topically 4 (four) times daily. Apply to back  ? Dulaglutide (TRULICITY) 3.61 WE/3.1VQ SOPN Inject 0.5 mg into the skin every 14 (fourteen) days.  ? Lancets (ONETOUCH DELICA PLUS [id number]) MISC USE TO CHECK BLOOD SUGAR TWICE DAILY  ? lidocaine (LIDODERM) 5 % Place 1 patch onto the skin daily. Remove & Discard patch within 12 hours or as directed by MD  ? Magnesium 250 MG TABS Take 1 tablet (250 mg total) by mouth every evening. Take with dinner meal  ? Multiple Vitamin (MULTIVITAMIN) tablet Take 1 tablet by mouth daily.  ? Multiple Vitamins-Minerals (ZINC PO) Take 1 tablet by mouth as needed.  ? ONETOUCH VERIO test strip USE  TO TEST 2 TIMES DAILY  ? rosuvastatin (CRESTOR) 20 MG tablet TAKE 1 TABLET(20 MG) BY MOUTH DAILY  ? Saccharomyces boulardii (PROBIOTIC) 250 MG CAPS 1 tablet by mouth daily  ? tiZANidine (ZANAFLEX) 4 MG tablet Take 0.5 tablets (2  mg total) by mouth every 6 (six) hours as needed for muscle spasms.  ? valsartan (DIOVAN) 80 MG tablet Take 1 tablet (80 mg total) by mouth daily.  ? vitamin B-12 (CYANOCOBALAMIN) 100 MCG tablet Take 1 tablet by mouth daily  ? Vitamin D, Ergocalciferol, (DRISDOL) 1.25 MG (50000 UNIT) CAPS capsule TAKE 1 CAPSULE BY MOUTH TWICE WEEKLY ON TUESDAYS AND FRIDAYS  ? ?No facility-administered encounter medications on file as of 01/03/2022.  ?Reviewed chart for medication changes ahead of medication coordination call. ? ?No medication changes indicated OR if recent visit, treatment plan here. ? ?BP Readings from Last 3 Encounters:  ?11/27/21 (!) 126/55  ?11/25/21 (!) 143/55  ?11/03/21 136/74  ?  ?Lab Results  ?Component Value Date  ? HGBA1C 6.5 (H) 11/03/2021  ?  ? ?Patient obtains medications through Adherence Packaging  30 Days  ? ?Last adherence delivery included:  ?Valsartan 80 mg at breakfast ?Rosuvastatin 20 mg- 1 tablet daily (Breakfast) ?Allopurinol 300 mg- 1 tablet daily (Breakfast) ?Asa 81 mg- 1 tablet daily (Breakfast) ?Atenolol-Chlorthalidone 50/25 mg- 1 tablet daily (Breakfast) ?Multivitamin - 1 tablet daily (Evening meal)  ?Vitamin D 1.25 mg- 1 tablet twice a week on Tuesdays and Fridays (Breakfast) ?Probiotic daily- 1 tablet daily (Breakfast) ?Magnesium 250 mg- 1 tablet daily  (Evening meal) ?Vitamin B12- 1 tablet daily (Evening meal) ?  ?Patient declined (meds) last month: ?None ? ?Patient is due for next adherence delivery on: 01-13-2022 ? ?Called patient and reviewed medications and coordinated delivery. ? ?This delivery to include: ?Valsartan 80 mg before breakfast ?Rosuvastatin 20 mg daily before breakfast ?Allopurinol 300 mg daily before breakfast ?Asa 81 mg daily before breakfast ?Atenolol-Chlorthalidone 50/25 mg daily before breakfast ?Vitamin D 1.25 mg- 1 tablet twice a week on Tuesdays and Fridays before breakfast ? ?No acute/short fill needed ? ?Patient declined the following  medications: ?None ? ?Patient needs refills for: ?None ? ?Confirmed delivery date of 01-13-2022 advised patient that pharmacy will contact them the morning of delivery. ? ?Care Gaps: ?Shingrix overdue ?PNA Vac overdue ?AWV 06-22-2022 ? ?Star Rating Drugs: ?Rosuvastatin 20 mg- Last filled 12-13-2021 30 [facility] ?Valsartan 80 mg- Last filled 12-13-2021 [facility] ?Trulicity 0.5 mg- Patient assistance ? ?Malecca Hicks CMA ?Clinical Pharmacist Assistant ?[phone] ? ?

## 2022-01-07 ENCOUNTER — Other Ambulatory Visit: Payer: Self-pay

## 2022-01-07 ENCOUNTER — Ambulatory Visit
Admission: EM | Admit: 2022-01-07 | Discharge: 2022-01-07 | Disposition: A | Discharge status: Home / Self Care | Payer: Medicare Other | Attending: Internal Medicine | Admitting: Internal Medicine

## 2022-01-07 DIAGNOSIS — N3001 Acute cystitis with hematuria: Secondary | ICD-10-CM | POA: Diagnosis not present

## 2022-01-07 DIAGNOSIS — N898 Other specified noninflammatory disorders of vagina: Secondary | ICD-10-CM | POA: Diagnosis not present

## 2022-01-07 DIAGNOSIS — R350 Frequency of micturition: Secondary | ICD-10-CM | POA: Diagnosis not present

## 2022-01-07 DIAGNOSIS — R300 Dysuria: Secondary | ICD-10-CM | POA: Insufficient documentation

## 2022-01-07 LAB — POCT URINALYSIS DIP (MANUAL ENTRY)
Bilirubin, UA: NEGATIVE
Glucose, UA: NEGATIVE mg/dL
Ketones, POC UA: NEGATIVE mg/dL
Nitrite, UA: POSITIVE — AB
Protein Ur, POC: 100 mg/dL — AB
Spec Grav, UA: 1.02 (ref 1.010–1.025)
Urobilinogen, UA: 0.2 E.U./dL
pH, UA: 7 (ref 5.0–8.0)

## 2022-01-07 MED ORDER — CEPHALEXIN 500 MG PO CAPS: 500.0000 mg | ORAL_CAPSULE | Freq: Four times a day (QID) | ORAL | 0 refills | Status: DC

## 2022-01-07 NOTE — Discharge Instructions (Signed)
It appears that you have a urinary tract infection which is being treated with an antibiotic.  Urine culture and vaginal swab are pending.  We will call if results are positive and treat as appropriate. ?

## 2022-01-07 NOTE — ED Triage Notes (Signed)
Patient presents to Urgent Care with complaints of possible UTI. Pt states increased urinary frequency and vaginal irritation x 4 days. Has a hx of UTIs. Pt states she was using surface cleaner wipes to cleanse vaginal area unknowingly.  ? ?Denies fever or hematuria.  ?

## 2022-01-07 NOTE — ED Provider Notes (Signed)
[facility]    CSN: [id number] Arrival date & time: 01/07/22  1135      History   Chief Complaint Chief Complaint  Patient presents with   Urinary Frequency   Vaginal Itching    HPI [name] is a 81 y.o. female.   Patient presents with increased urinary frequency, urinary burning, vaginal irritation that has been present for approximately 4 days.  Patient reports history of multiple UTIs with the last being in January.  Patient reports vaginal irritation after using surface cleaner wipes by accident and vaginal area as opposed to regular vaginal wipes.  She reports vaginal discharge but is not sure the color.  Denies pelvic pain, abdominal pain, fever, back pain, hematuria.  Patient recently had procedure by urology for urge incontinence.   Urinary Frequency  Vaginal Itching   Past Medical History:  Diagnosis Date   Abdominal pain    Arthritis    cervical disc degeneration/ oa left knee, carpal tunnel rt wrist, adhesive capsulitis right shoulder, rt hand weakness; lumbar degeneration   Carpal tunnel syndrome    Complication of anesthesia 2006-at [facility]   breathing problems-no BP med given prior to surgery;  hx of being very sleepy after colon surgery --  states no problems with last right total knee replacement 2013   Constipation    Diabetes mellitus without complication ([place])    borderline - diet control   Diverticulitis hx of   Diverticulosis    E. coli infection    2020   Frequent UTI    hx of urethral injury during colon surgery - states frequent uti's since   GERD (gastroesophageal reflux disease)    H/O hiatal hernia    History of palpitations    in the past   History of shingles    has a lingering itching on back where shingles were   Hyperlipidemia    Hypertension    Numbness and tingling in right hand    pt. 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   Aortic atherosclerosis ([place]) 01/03/2021   Class 2 severe obesity due to excess calories with serious comorbidity and body mass index (BMI) of 39.0 to 39.9 in adult ([place]) 01/03/2021   Pruritus 01/03/2021   Vitamin D deficiency 06/01/2020   Abdominal bloating 02/17/2019   Urinary frequency 02/17/2019   Uncontrolled type 2 diabetes mellitus with hyperglycemia ([place]) 02/17/2019   Lightheaded 02/17/2019   Constipation 06/26/2018   Hyperlipidemia 08/23/2014   Type 2 diabetes mellitus with stage 3 chronic kidney disease, without long-term current use of insulin ([place]) 06/19/2014   Mixed hyperlipidemia 06/19/2014   S/P urological surgery 09/30/2013   Joint pain 09/30/2013   UTI (urinary tract infection) 09/30/2013   Cough 12/10/2012   OA (osteoarthritis) of knee 01/05/2012   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 08/02/2011   Vaginal discharge 08/02/2011   Spasm of lumbar paraspinous muscle 06/17/2011   Morbidly obese ([place]) 05/31/2011   HIP PAIN, LEFT, CHRONIC 11/09/2010  CARPAL TUNNEL SYNDROME, RIGHT 10/14/2010   KNEE PAIN, RIGHT, CHRONIC 10/14/2010   LBBB (left bundle branch block) 94/70/9628   HELICOBACTER PYLORI GASTRITIS 07/18/2010   FATIGUE 07/01/2010   CHEST PAIN, ATYPICAL 07/01/2010   EPIGASTRIC PAIN 07/01/2010   SUPRAPUBIC PAIN 07/01/2010   HYPERGLYCEMIA 07/01/2010   CELLULITIS AND ABSCESS OF OTHER SPECIFIED SITE 02/15/2010   LUMP OR MASS IN BREAST 01/10/2010   DYSURIA, CHRONIC 03/09/2009   GERD 10/06/2008   Personal history of colonic polyps-adenoma 08/26/2008   LOC OSTEOARTHROS NOT SPEC PRIM/SEC LOWER LEG  09/10/2007   GOUT, UNSPECIFIED 09/09/2007   DIVERTICULOSIS, COLON 09/09/2007   DIVERTICULITIS, HX OF 09/09/2007   HYPERLIPIDEMIA 06/03/2007   Essential hypertension 06/03/2007    Past Surgical History:  Procedure Laterality Date   ABDOMINAL HYSTERECTOMY     bladder tack     BREAST BIOPSY Left    BREAST SURGERY     breast duct resection- benign   CARPAL TUNNEL RELEASE Right 06/04/2014   Procedure: RIGHT CARPAL TUNNEL RELEASE;  Surgeon: Roseanne Kaufman, MD;  Location: [place];  Service: Orthopedics;  Laterality: Right;   COLON RESECTION  2008   hx diverticulosis   COLON SURGERY     COLONOSCOPY     colonoscopy [id number]     JOINT REPLACEMENT     OOPHORECTOMY     POLYPECTOMY     skin graft left arm - traumatic compression injury left upper arm     temporary ureter stent     TOTAL KNEE ARTHROPLASTY  01/05/2012   Procedure: TOTAL KNEE ARTHROPLASTY;  Surgeon: [name], MD;  Location: [facility];  Service: Orthopedics;  Laterality: Right;   TOTAL KNEE ARTHROPLASTY Left 08/17/2014   Procedure: LEFT TOTAL KNEE ARTHROPLASTY;  Surgeon: [name], MD;  Location: [facility];  Service: Orthopedics;  Laterality: Left;   ureter repair for tyransected left ureter      OB History   No obstetric history on file.      Home Medications    Prior to Admission medications   Medication Sig Start Date End Date Taking? Authorizing Provider  cephALEXin (KEFLEX) 500 MG capsule Take 1 capsule (500 mg total) by mouth 4 (four) times daily. 01/07/22  Yes Gurtha Picker, Michele Rockers, FNP  allopurinol (ZYLOPRIM) 300 MG tablet Take 1 tablet by mouth daily 08/18/21   [name], MD  aspirin EC 81 MG tablet Take 1 tablet (81 mg total) by mouth daily. 08/18/21   [name], MD  atenolol-chlorthalidone (TENORETIC) 50-25 MG tablet Take 1 tablet by mouth daily. 11/07/21   [name], MD  benzonatate (TESSALON PERLES) 100 MG capsule Take 1 capsule (100 mg total) by mouth 3 (three) times  daily as needed for cough. 11/07/21 11/07/22  [name], FNP  Blood Pressure KIT 1 Units by Does not apply route daily. Check blood pressure daily for hypertension dx code I10 09/14/21   Skeet Latch, MD  diclofenac Sodium (VOLTAREN) 1 % GEL Apply 4 g topically 4 (four) times daily. Apply to back 11/26/21   Palumbo, April, MD  Dulaglutide (TRULICITY) 3.66 QH/4.7ML SOPN Inject 0.5 mg into the skin every 14 (fourteen) days.    [provider]  Lancets (ONETOUCH DELICA PLUS [id number]) [place] USE TO CHECK BLOOD SUGAR TWICE DAILY 12/24/20   [name], MD  lidocaine (LIDODERM) 5 % Place 1 patch onto the skin daily. Remove & Discard patch within 12 hours or as directed by MD 11/26/21   Randal Buba, April, MD  Magnesium  250 MG TABS Take 1 tablet (250 mg total) by mouth every evening. Take with dinner meal 08/18/21   [name], MD  Multiple Vitamin (MULTIVITAMIN) tablet Take 1 tablet by mouth daily. 08/18/21   [name], MD  Multiple Vitamins-Minerals (ZINC PO) Take 1 tablet by mouth as needed.    [provider]  [facility] VERIO test strip USE TO TEST 2 TIMES DAILY 12/20/20   [name], MD  rosuvastatin (CRESTOR) 20 MG tablet TAKE 1 TABLET(20 MG) BY MOUTH DAILY 09/06/21   [name], MD  Saccharomyces boulardii (PROBIOTIC) 250 MG CAPS 1 tablet by mouth daily 08/18/21   [name], MD  tiZANidine (ZANAFLEX) 4 MG tablet Take 0.5 tablets (2 mg total) by mouth every 6 (six) hours as needed for muscle spasms. 11/27/21   Luna Fuse, MD  valsartan (DIOVAN) 80 MG tablet Take 1 tablet (80 mg total) by mouth daily. 09/14/21   Skeet Latch, MD  vitamin B-12 (CYANOCOBALAMIN) 100 MCG tablet Take 1 tablet by mouth daily 08/18/21   [name], MD  Vitamin D, Ergocalciferol, (DRISDOL) 1.25 MG (50000 UNIT) CAPS capsule TAKE 1 CAPSULE BY MOUTH TWICE WEEKLY ON TUESDAYS AND FRIDAYS 12/07/21   [name], MD    Family History Family History  Problem Relation Age of Onset    Breast cancer Mother 33   Hypertension Mother    Prostate cancer Father    Hypertension Father    Dementia Sister    Lung cancer Brother    Hypertension Brother    COPD Brother    Cancer Maternal Grandmother    Arthritis Sister    Hypertension Sister    Arthritis Sister    Hypertension Child    Hypertension Child    Hypertension Child    Colon polyps Neg Hx    Esophageal cancer Neg Hx    Rectal cancer Neg Hx    Stomach cancer Neg Hx    Colon cancer Neg Hx     Social History Social History   Tobacco Use   Smoking status: Never   Smokeless tobacco: Never  Vaping Use   Vaping Use: Never used  Substance Use Topics   Alcohol use: No   Drug use: No     Allergies   Atorvastatin, Atorvastatin calcium, Bee venom, Hydrocodone-acetaminophen, Oxycodone, and Vicodin [hydrocodone-acetaminophen]   Review of Systems Review of Systems Per HPI  Physical Exam Triage Vital Signs ED Triage Vitals  Enc Vitals Group     BP 01/07/22 1146 129/80     Pulse Rate 01/07/22 1146 85     Resp 01/07/22 1146 18     Temp 01/07/22 1146 98.1 F (36.7 C)     Temp Source 01/07/22 1146 Oral     SpO2 01/07/22 1146 96 %     Weight --      Height --      Head Circumference --      Peak Flow --      Pain Score 01/07/22 1154 0     Pain Loc --      Pain Edu? --      Excl. in [place]? --    No data found.  Updated Vital Signs BP 129/80 (BP Location: Left Arm)    Pulse 85    Temp 98.1 F (36.7 C) (Oral)    Resp 18    SpO2 96%   Visual Acuity Right Eye Distance:   Left Eye Distance:   Bilateral Distance:    Right Eye  Near:   Left Eye Near:    Bilateral Near:     Physical Exam Constitutional:      General: She is not in acute distress.    Appearance: Normal appearance. She is not toxic-appearing or diaphoretic.  HENT:     Head: Normocephalic and atraumatic.  Eyes:     Extraocular Movements: Extraocular movements intact.     Conjunctiva/sclera: Conjunctivae normal.  Cardiovascular:      Rate and Rhythm: Normal rate and regular rhythm.     Pulses: Normal pulses.     Heart sounds: Normal heart sounds.  Pulmonary:     Effort: Pulmonary effort is normal. No respiratory distress.     Breath sounds: Normal breath sounds. No wheezing.  Abdominal:     General: Bowel sounds are normal. There is no distension.     Palpations: Abdomen is soft.     Tenderness: There is no abdominal tenderness.  Genitourinary:    Comments: Patient declined exam.  Self swab performed. Neurological:     General: No focal deficit present.     Mental Status: She is alert and oriented to person, place, and time. Mental status is at baseline.  Psychiatric:        Mood and Affect: Mood normal.        Behavior: Behavior normal.        Thought Content: Thought content normal.        Judgment: Judgment normal.     UC Treatments / Results  Labs (all labs ordered are listed, but only abnormal results are displayed) Labs Reviewed  POCT URINALYSIS DIP (MANUAL ENTRY) - Abnormal; Notable for the following components:      Result Value   Clarity, UA cloudy (*)    Blood, UA moderate (*)    Protein Ur, POC =100 (*)    Nitrite, UA Positive (*)    Leukocytes, UA Large (3+) (*)    All other components within normal limits  URINE CULTURE  CERVICOVAGINAL ANCILLARY ONLY    EKG   Radiology No results found.  Procedures Procedures (including critical care time)  Medications Ordered in UC Medications - No data to display  Initial Impression / Assessment and Plan / UC Course  I have reviewed the triage vital signs and the nursing notes.  Pertinent labs & imaging results that were available during my care of the patient were reviewed by me and considered in my medical decision making (see chart for details).     Urinalysis indicating urinary tract infection.  Will treat with cephalexin given patient's age.  Creatinine clearance was 73 so cephalexin normal dose should be safe.  Cervicovaginal  swab pending to rule out bacterial vaginosis given that she recently used vaginal wipes and there is concern for disruption of pH.  Urine culture is also pending.  Discussed return precautions.  Patient verbalized understanding and was agreeable with plan. Final Clinical Impressions(s) / UC Diagnoses   Final diagnoses:  Acute cystitis with hematuria  Dysuria  Urinary frequency  Vaginal irritation     Discharge Instructions      It appears that you have a urinary tract infection which is being treated with an antibiotic.  Urine culture and vaginal swab are pending.  We will call if results are positive and treat as appropriate.    ED Prescriptions     Medication Sig Dispense Auth. Provider   cephALEXin (KEFLEX) 500 MG capsule Take 1 capsule (500 mg total) by mouth 4 (four) times daily.  28 capsule [place], Michele Rockers, [place]      PDMP not reviewed this encounter.   Teodora Medici, [place] 01/07/22 1234

## 2022-01-09 LAB — URINE CULTURE: Culture: >=100000 — AB

## 2022-01-09 LAB — CERVICOVAGINAL ANCILLARY ONLY
Bacterial Vaginitis (gardnerella): NEGATIVE
Candida Glabrata: NEGATIVE
Candida Vaginitis: NEGATIVE
Comment: NEGATIVE
Comment: NEGATIVE
Comment: NEGATIVE

## 2022-01-10 ENCOUNTER — Telehealth: Payer: Self-pay

## 2022-01-10 DIAGNOSIS — R1084 Generalized abdominal pain: Secondary | ICD-10-CM | POA: Diagnosis not present

## 2022-01-10 DIAGNOSIS — R109 Unspecified abdominal pain: Secondary | ICD-10-CM | POA: Diagnosis not present

## 2022-01-10 NOTE — Chronic Care Management (AMB) (Signed)
? ? ? ?  EMERALD SHOR was reminded to have all medications, supplements and any blood glucose and blood pressure readings available for review with [name], Pharm. D, at her telephone visit on 01-13-2022 at 9:00. ? ? ? ?Jeannette How CMA ?Clinical Pharmacist Assistant ?[phone] ? ? ?

## 2022-01-12 ENCOUNTER — Other Ambulatory Visit (HOSPITAL_COMMUNITY): Payer: Self-pay | Admitting: Student

## 2022-01-12 ENCOUNTER — Other Ambulatory Visit: Payer: Self-pay | Admitting: Student

## 2022-01-13 ENCOUNTER — Telehealth: Payer: Medicare Other

## 2022-01-13 ENCOUNTER — Telehealth: Payer: Self-pay

## 2022-01-13 NOTE — Progress Notes (Incomplete)
? ?Chronic Care Management ?Pharmacy Note ? ?01/13/2022 ?Name:  [name] MRN:  [id number] DOB:  12-03-1940 ? ?Summary: ?*** ? ?Recommendations/Changes made from today's visit: ?*** ? ?Plan: ?*** ? ? ?Subjective: ?[name] is an 81 y.o. year old female who is a primary patient of [name], MD.  The CCM team was consulted for assistance with disease management and care coordination needs.   ? ?{CCMTELEPHONEFACETOFACE:21091510} for {CCMINITIALFOLLOWUPCHOICE:21091511} in response to provider referral for pharmacy case management and/or care coordination services.  ? ?Consent to Services:  ?{CCMCONSENTOPTIONS:25074} ? ?Patient Care Team: ?[name], MD as PCP - General (Internal Medicine) ?Little, Claudette Stapler, RN as Case Manager ?Mayford Knife, RPH (Pharmacist) ? ?Recent office visits: ?*** ? ?Recent consult visits: ?*** ? ?Hospital visits: ?{Hospital DC Yes/No:25215} ? ? ?Objective: ? ?Lab Results  ?Component Value Date  ? CREATININE 1.08 (H) 11/27/2021  ? BUN 26 (H) 11/27/2021  ? GFR 53.18 (L) 06/19/2014  ? EGFR 51 (L) 11/03/2021  ? GFRNONAA 52 (L) 11/27/2021  ? GFRAA 54 (L) 06/01/2020  ? NA 136 11/27/2021  ? K 3.8 11/27/2021  ? CALCIUM 9.3 11/27/2021  ? CO2 23 11/27/2021  ? GLUCOSE 120 (H) 11/27/2021  ? ? ?Lab Results  ?Component Value Date/Time  ? HGBA1C 6.5 (H) 11/03/2021 11:07 AM  ? HGBA1C 6.7 (H) 07/12/2021 10:45 AM  ? GFR 53.18 (L) 06/19/2014 03:42 PM  ? GFR 41.03 (L) 06/09/2014 09:23 AM  ? MICROALBUR 10 03/19/2019 02:04 PM  ?  ?Last diabetic Eye exam:  ?Lab Results  ?Component Value Date/Time  ? HMDIABEYEEXA No Retinopathy 02/03/2021 12:00 AM  ?  ?Last diabetic Foot exam: No results found for: HMDIABFOOTEX  ? ?Lab Results  ?Component Value Date  ? CHOL 181 11/03/2021  ? HDL 43 11/03/2021  ? LDLCALC 113 (H) 11/03/2021  ? LDLDIRECT 162.2 11/21/2013  ? TRIG 140 11/03/2021  ? CHOLHDL 4.2 11/03/2021  ? ? ?Hepatic Function Latest Ref Rng & Units 11/27/2021 11/25/2021 11/03/2021  ?Total Protein 6.5  - 8.1 g/dL 6.8 7.3 6.9  ?Albumin 3.5 - 5.0 g/dL 3.7 4.1 4.2  ?AST 15 - 41 U/L 39 33 35  ?ALT 0 - 44 U/L '31 31 27  ' ?Alk Phosphatase 38 - 126 U/L 40 41 51  ?Total Bilirubin 0.3 - 1.2 mg/dL 0.7 0.8 0.7  ?Bilirubin, Direct 0.1 - 0.5 mg/dL - - -  ? ? ?Lab Results  ?Component Value Date/Time  ? TSH 3.450 03/22/2020 03:29 PM  ? TSH 2.06 11/21/2013 11:35 AM  ? FREET4 1.05 03/22/2020 03:29 PM  ? ? ?CBC Latest Ref Rng & Units 11/27/2021 11/25/2021 07/12/2021  ?WBC 4.0 - 10.5 K/uL 10.1 10.7(H) CANCELED  ?Hemoglobin 12.0 - 15.0 g/dL 14.0 14.0 CANCELED  ?Hematocrit 36.0 - 46.0 % 41.5 42.9 CANCELED  ?Platelets 150 - 400 K/uL 205 222 CANCELED  ? ? ?Lab Results  ?Component Value Date/Time  ? VD25OH 59.0 11/03/2021 11:07 AM  ? VD25OH 22.8 (L) 06/01/2020 04:09 PM  ? ? ?Clinical ASCVD: {YES/NO:21197} ?The 10-year ASCVD risk score (Arnett DK, et al., 2019) is: 31.8% ?  Values used to calculate the score: ?    Age: 59 years ?    Sex: Female ?    Is Non-Hispanic African American: Yes ?    Diabetic: Yes ?    Tobacco smoker: No ?    Systolic Blood Pressure: 213 mmHg ?    Is BP treated: Yes ?    HDL Cholesterol: 43 mg/dL ?  Total Cholesterol: 181 mg/dL   ? ?Depression screen [facility] 2/9 05/19/2021 04/12/2021 03/22/2020  ?Decreased Interest 0 0 0  ?Down, Depressed, Hopeless 0 0 0  ?PHQ - 2 Score 0 0 0  ?Altered sleeping - - -  ?Tired, decreased energy - - -  ?Change in appetite - - -  ?Feeling bad or failure about yourself  - - -  ?Trouble concentrating - - -  ?Moving slowly or fidgety/restless - - -  ?Suicidal thoughts - - -  ?PHQ-9 Score - - -  ?Difficult doing work/chores - - -  ?Some recent data might be hidden  ?  ? ?***Other: (CHADS2VASc if Afib, MMRC or CAT for COPD, ACT, DEXA) ? ?Social History  ? ?Tobacco Use  ?Smoking Status Never  ?Smokeless Tobacco Never  ? ?BP Readings from Last 3 Encounters:  ?01/07/22 129/80  ?11/27/21 (!) 126/55  ?11/25/21 (!) 143/55  ? ?Pulse Readings from Last 3 Encounters:  ?01/07/22 85  ?11/27/21 (!) 57   ?11/25/21 60  ? ?Wt Readings from Last 3 Encounters:  ?11/27/21 243 lb (110.2 kg)  ?11/25/21 243 lb (110.2 kg)  ?11/03/21 237 lb 3.2 oz (107.6 kg)  ? ?BMI Readings from Last 3 Encounters:  ?11/27/21 39.22 kg/m?  ?11/25/21 39.22 kg/m?  ?11/03/21 37.37 kg/m?  ? ? ?Assessment/Interventions: Review of patient past medical history, allergies, medications, health status, including review of consultants reports, laboratory and other test data, was performed as part of comprehensive evaluation and provision of chronic care management services.  ? ?SDOH:  (Social Determinants of Health) assessments and interventions performed: {yes/no:20286} ? ?SDOH Screenings  ? ?Alcohol Screen: Low Risk   ? Last Alcohol Screening Score (AUDIT): 0  ?Depression (PHQ2-9): Low Risk   ? PHQ-2 Score: 0  ?Financial Resource Strain: Low Risk   ? Difficulty of Paying Living Expenses: Not hard at all  ?Food Insecurity: No Food Insecurity  ? Worried About Charity fundraiser in the Last Year: Never true  ? Ran Out of Food in the Last Year: Never true  ?Housing: Low Risk   ? Last Housing Risk Score: 0  ?Physical Activity: Inactive  ? Days of Exercise per Week: 0 days  ? Minutes of Exercise per Session: 0 min  ?Social Connections: Not on file  ?Stress: No Stress Concern Present  ? Feeling of Stress : Only a little  ?Tobacco Use: Low Risk   ? Smoking Tobacco Use: Never  ? Smokeless Tobacco Use: Never  ? Passive Exposure: Not on file  ?Transportation Needs: No Transportation Needs  ? Lack of Transportation (Medical): No  ? Lack of Transportation (Non-Medical): No  ? ? ?CCM Care Plan ? ?Allergies  ?Allergen Reactions  ? Atorvastatin Itching  ?  Other reaction(s): Itching ?  ? Atorvastatin Calcium Itching  ? Bee Venom Hives  ? Hydrocodone-Acetaminophen Other (See Comments)  ?  hallucinations ?hallucinations  ? Oxycodone Other (See Comments)  ?  hallucinations  ? Vicodin [Hydrocodone-Acetaminophen] Other (See Comments)  ?  hallucinations  ? ? ?Medications  Reviewed Today   ? ? Reviewed by Otho Perl, RN (Registered Nurse) on 01/07/22 at 1147  Med List Status: <None>  ? ?Medication Order Taking? Sig Documenting Provider Last Dose Status Informant  ?allopurinol (ZYLOPRIM) 300 MG tablet [id number]  Take 1 tablet by mouth daily [name], MD  Active   ?aspirin EC 81 MG tablet [id number]  Take 1 tablet (81 mg total) by mouth daily. [name], MD  Active   ?atenolol-chlorthalidone (  TENORETIC) 50-25 MG tablet [id number]  Take 1 tablet by mouth daily. [name], MD  Active   ?benzonatate (TESSALON PERLES) 100 MG capsule [id number]  Take 1 capsule (100 mg total) by mouth 3 (three) times daily as needed for cough. [name], FNP  Active   ?Blood Pressure KIT [id number]  1 Units by Does not apply route daily. Check blood pressure daily for hypertension dx code I10 Skeet Latch, MD  Active   ?cephALEXin (KEFLEX) 500 MG capsule [id number]  Take 1 capsule (500 mg total) by mouth 4 (four) times daily. Palumbo, April, MD  Active   ?diclofenac Sodium (VOLTAREN) 1 % GEL [id number]  Apply 4 g topically 4 (four) times daily. Apply to back Palumbo, April, MD  Active   ?Dulaglutide (TRULICITY) 2.90 YB/5.3DF SOPN [id number]  Inject 0.5 mg into the skin every 14 (fourteen) days. [provider]  Active   ?Lancets (ONETOUCH DELICA PLUS [id number]) [place] [id number]  USE TO CHECK BLOOD SUGAR TWICE DAILY [name], MD  Active   ?lidocaine (LIDODERM) 5 % [id number]  Place 1 patch onto the skin daily. Remove & Discard patch within 12 hours or as directed by MD Randal Buba, April, MD  Active   ?Magnesium 250 MG TABS [id number]  Take 1 tablet (250 mg total) by mouth every evening. Take with dinner meal [name], MD  Active   ?Multiple Vitamin (MULTIVITAMIN) tablet [id number]  Take 1 tablet by mouth daily. [name], MD  Active   ?Multiple Vitamins-Minerals (ZINC PO) [id number]  Take 1 tablet by mouth as needed. [provider]  Active   ?ONETOUCH VERIO  test strip [id number]  USE TO TEST 2 TIMES DAILY [name], MD  Active   ?rosuvastatin (CRESTOR) 20 MG tablet [id number]  TAKE 1 TABLET(20 MG) BY MOUTH DAILY [name], MD  Active   ?Saccharomyces boulard

## 2022-01-13 NOTE — Telephone Encounter (Signed)
?  Care Management  ? ?Follow Up Note ? ? ?01/13/2022 ?Name: [name] MRN: [id number] DOB: [birth year] ? ? ?Referred by: [name], MD ?Reason for referral : No chief complaint on file. ? ? ?An unsuccessful telephone outreach was attempted today. The patient was referred to the case management team for assistance with care management and care coordination.  ? ?Follow Up Plan: HC to follow up with patient to reschedule appointment.  ? ?[name], CPP, PharmD ?Clinical Pharmacist Practitioner ?[facility] ?[phone] ? ? ? ? ? ?

## 2022-01-24 ENCOUNTER — Ambulatory Visit (HOSPITAL_COMMUNITY)
Admission: RE | Admit: 2022-01-24 | Discharge: 2022-01-24 | Disposition: A | Discharge status: Home / Self Care | Payer: Medicare Other | Source: Ambulatory Visit | Attending: Student | Admitting: Student

## 2022-01-24 ENCOUNTER — Encounter (HOSPITAL_COMMUNITY): Payer: Self-pay

## 2022-01-24 ENCOUNTER — Other Ambulatory Visit: Payer: Self-pay

## 2022-01-24 DIAGNOSIS — R109 Unspecified abdominal pain: Secondary | ICD-10-CM | POA: Diagnosis not present

## 2022-01-24 DIAGNOSIS — I700 Atherosclerosis of aorta: Secondary | ICD-10-CM | POA: Diagnosis not present

## 2022-01-24 LAB — POCT I-STAT CREATININE: Creatinine, Ser: 1.1 mg/dL — ABNORMAL HIGH (ref 0.44–1.00)

## 2022-01-24 IMAGING — CT CT ABD-PELV W/ CM
2 of 5 series · 15 of 46 positions shown, 17 images · IV contrast (OMNIPAQUE)
Comparison: [DATE]

CLINICAL DATA: Right side back pain

EXAM:
CT ABDOMEN AND PELVIS WITH CONTRAST
TECHNIQUE: Multidetector CT imaging of the abdomen and pelvis was performed
using the standard protocol following bolus administration of
intravenous contrast.

[Series 2: axial st · axial · 0.87mm/px · z∈[-427,-62]mm · 12 of 87 slices shown, 14 images]
[im 7/87  soft-tissue]
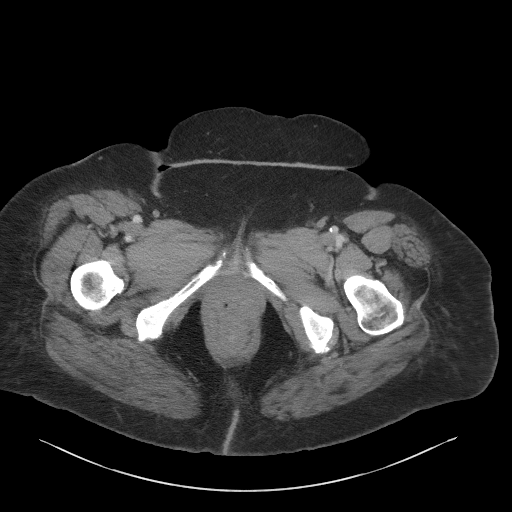
[im 7/87  bone]
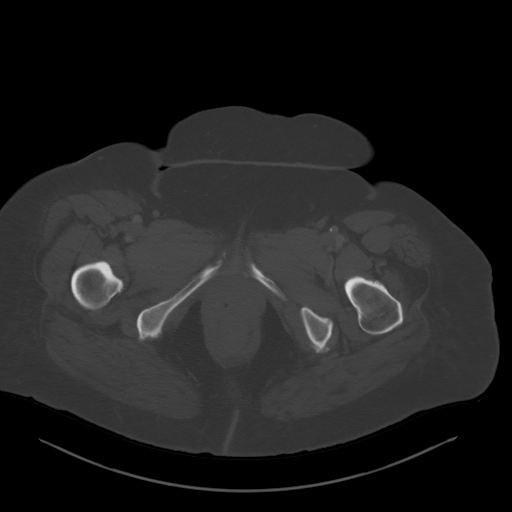
[im 14/87  soft-tissue]
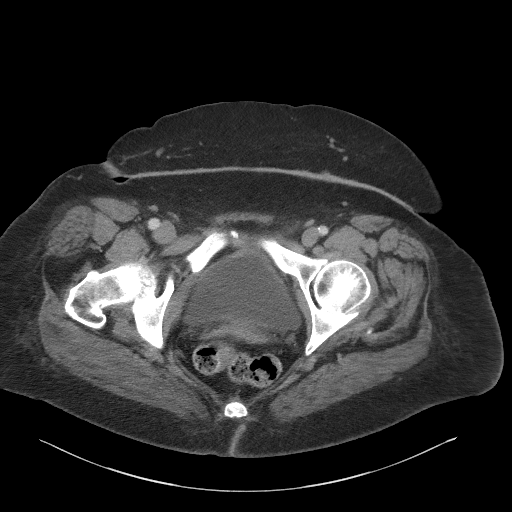
[im 20/87  soft-tissue]
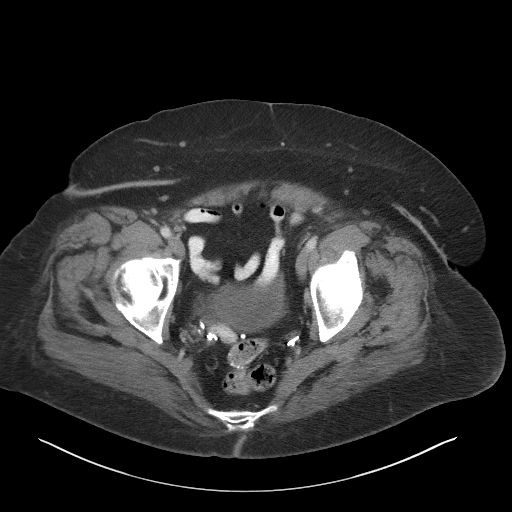
[im 27/87  soft-tissue]
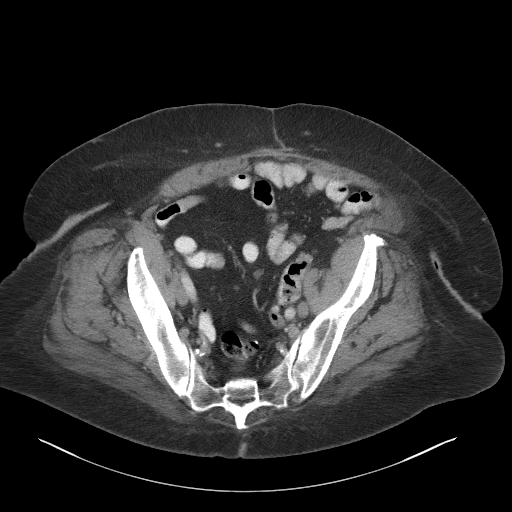
[im 34/87  soft-tissue]
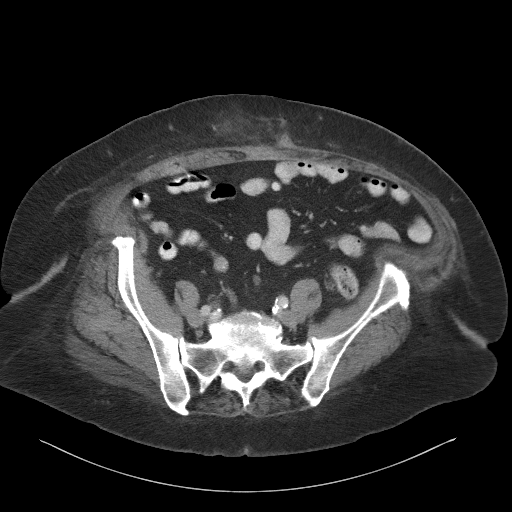
[im 40/87  soft-tissue]
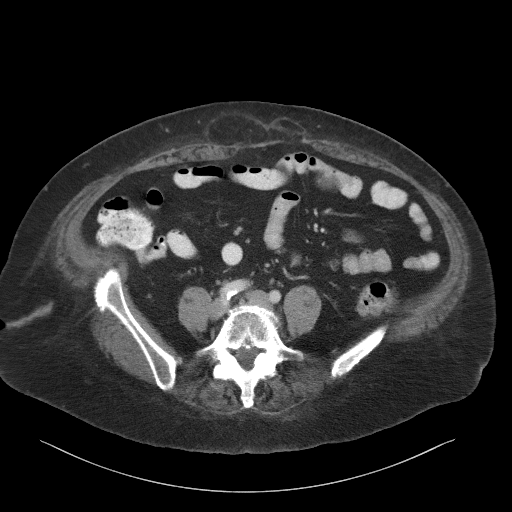
[im 47/87  soft-tissue]
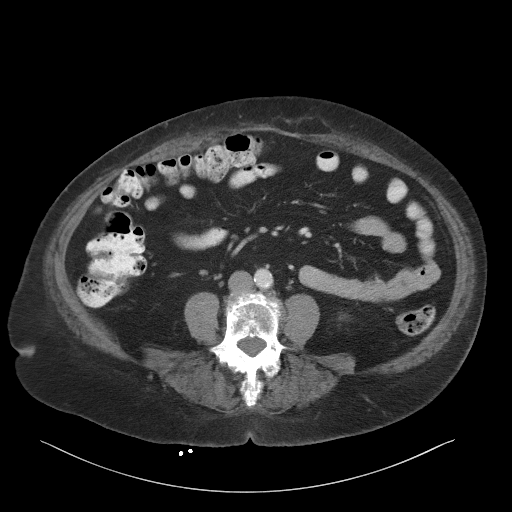
[im 53/87  soft-tissue]
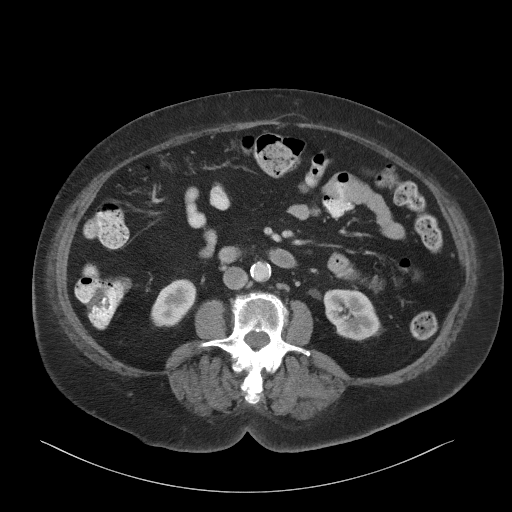
[im 60/87  soft-tissue]
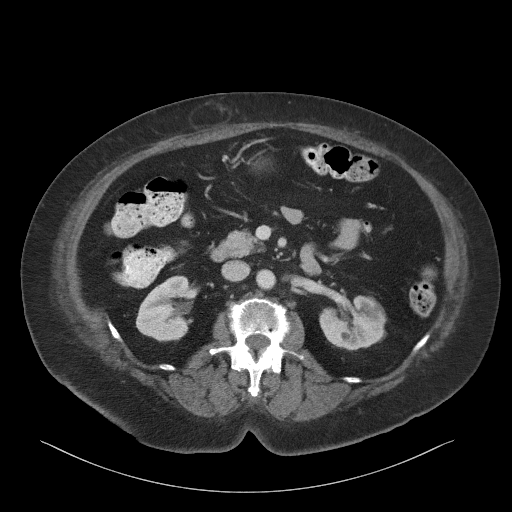
[im 60/87  bone]
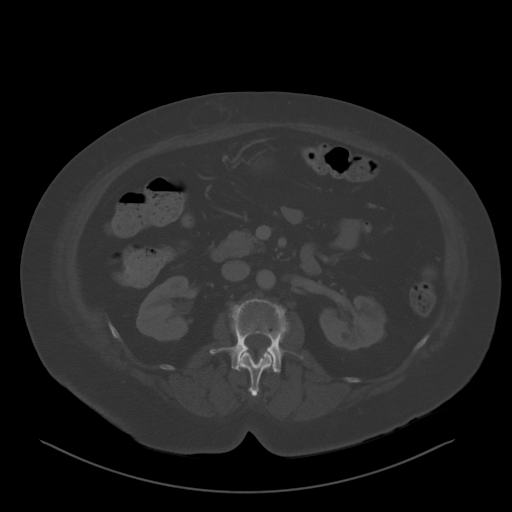
[im 67/87  soft-tissue]
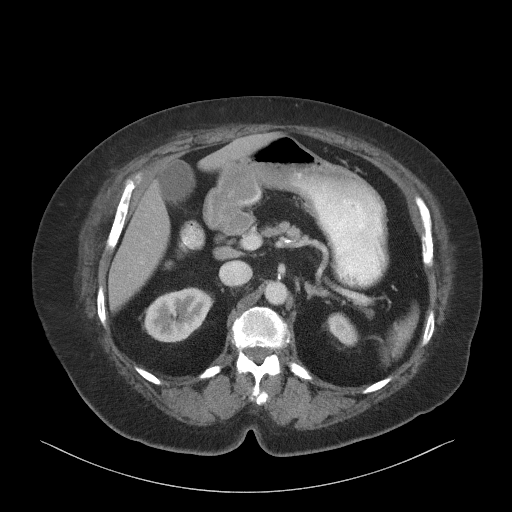
[im 73/87  soft-tissue]
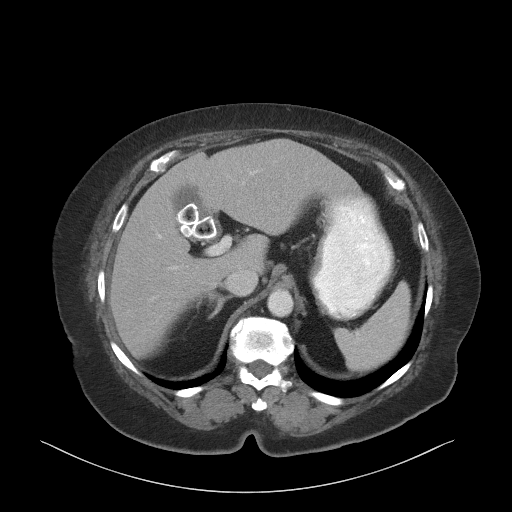
[im 80/87  soft-tissue]
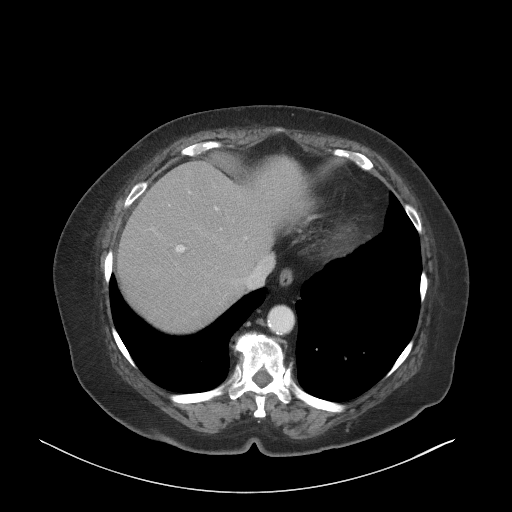

[Series 5: coronal st · coronal · 0.79mm/px · 3 of 101 slices shown]
[im 34/101  soft-tissue]
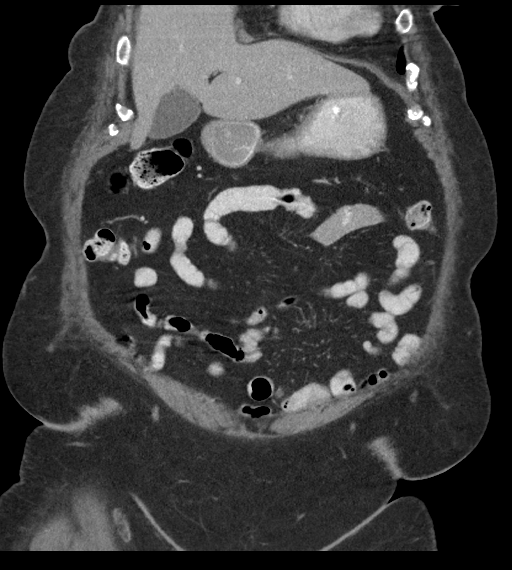
[im 45/101  soft-tissue]
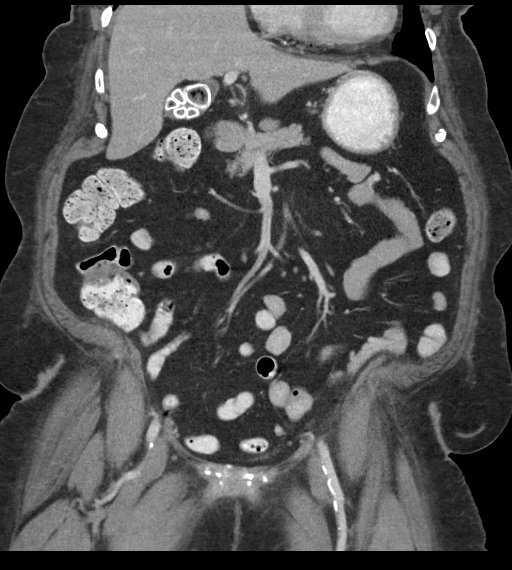
[im 56/101  soft-tissue]
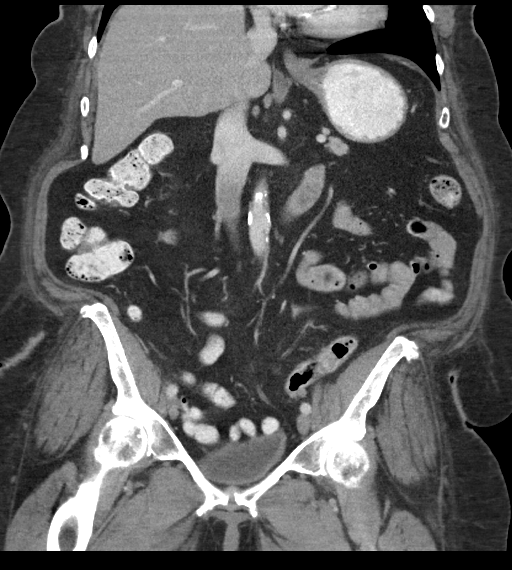

[15 of 46 positions shown; findings below may reference images not displayed]

RADIATION DOSE REDUCTION: This exam was performed according to the
departmental dose-optimization program which includes automated
exposure control, adjustment of the mA and/or kV according to
patient size and/or use of iterative reconstruction technique.

CONTRAST:  100mL OMNIPAQUE IOHEXOL 300 MG/ML  SOLN
FINDINGS: Lower chest: No acute abnormality.

Hepatobiliary: Multiple calcified stones in the gallbladder
measuring up to 1.9 cm. No wall thickening. No biliary ductal
dilatation or focal hepatic abnormality.

Pancreas: No focal abnormality or ductal dilatation.

Spleen: No focal abnormality.  Normal size.

Adrenals/Urinary Tract: No adrenal abnormality. No focal renal
abnormality. No stones or hydronephrosis. Urinary bladder is
unremarkable.

Stomach/Bowel: Postoperative changes in the sigmoid colon. Stomach,
large and small bowel grossly unremarkable. Normal appendix

Vascular/Lymphatic: Aortic atherosclerosis. No evidence of aneurysm
or adenopathy.

Reproductive: Prior hysterectomy.  No adnexal masses.

Other: No free fluid or free air. Multiple small supraumbilical
ventral hernias containing fat.

Musculoskeletal: No acute bony abnormality.
IMPRESSION: No acute findings in the abdomen or pelvis.

Cholelithiasis.  No CT evidence of acute cholecystitis.

Aortic atherosclerosis.

Multiple supraumbilical ventral hernias containing fat.

## 2022-01-24 MED ORDER — SODIUM CHLORIDE (PF) 0.9 % IJ SOLN
INTRAMUSCULAR | Status: AC
  Filled 2022-01-24: qty 50

## 2022-01-24 MED ORDER — IOHEXOL 300 MG/ML  SOLN
100.0000 mL | Freq: Once | INTRAMUSCULAR | Status: AC | PRN
  Administered 2022-01-24: 100 mL via INTRAVENOUS

## 2022-02-01 ENCOUNTER — Telehealth: Payer: Self-pay

## 2022-02-01 NOTE — Chronic Care Management (AMB) (Signed)
? ? ?  Chronic Care Management ?Pharmacy Assistant  ? ?Name: JADALEE WESTCOTT  MRN: [id number] DOB: [birth year] ? ?02/01/2022- Patient called needing information to company to order her pull ups. Gave patient number to [employer] [phone].  ? ?Pattricia Boss, CMA ?Clinical Pharmacist Assistant ?[phone] ? ?

## 2022-02-02 ENCOUNTER — Telehealth: Payer: Self-pay

## 2022-02-02 ENCOUNTER — Other Ambulatory Visit: Payer: Self-pay

## 2022-02-02 MED ORDER — ASPIRIN EC 81 MG PO TBEC: 81.0000 mg | DELAYED_RELEASE_TABLET | Freq: Every day | ORAL | 1 refills | Status: DC

## 2022-02-02 MED ORDER — ALLOPURINOL 300 MG PO TABS: ORAL_TABLET | ORAL | 1 refills | Status: DC

## 2022-02-02 MED ORDER — ROSUVASTATIN CALCIUM 20 MG PO TABS: ORAL_TABLET | ORAL | 1 refills | Status: DC

## 2022-02-02 NOTE — Chronic Care Management (AMB) (Signed)
? ? ?Chronic Care Management ?Pharmacy Assistant  ? ?Name: [name]  MRN: [id number] DOB: 12-Oct-1941 ? ?Reason for Encounter: Medication Review/ Medication Coordination ? ?Recent office visits:  ?None ? ?Recent consult visits:  ?01-10-2022 Lucita Ferrara, MD (General surgery). Unable to view encounter ? ?Hospital visits:  ?Medication Reconciliation was completed by comparing discharge summary, patient?s EMR and Pharmacy list, and upon discussion with patient. ? ?Admitted to the hospital on 01-07-2022 due to Acute cystitis with hematuria. Discharge date was 01-07-2022.  Discharged from [facility]. ? ?New?Medications Started at [facility] Discharge:?? ?Keflex 500 mg 4 times daily  ? ?Medication Changes at Hospital Discharge: ?None ? ?Medications Discontinued at Hospital Discharge: ?None ? ?Medications that remain the same after Hospital Discharge:??  ?-All other medications will remain the same.   ? ?Medications: ?Outpatient Encounter Medications as of 02/02/2022  ?Medication Sig  ? allopurinol (ZYLOPRIM) 300 MG tablet Take 1 tablet by mouth daily  ? aspirin EC 81 MG tablet Take 1 tablet (81 mg total) by mouth daily.  ? atenolol-chlorthalidone (TENORETIC) 50-25 MG tablet Take 1 tablet by mouth daily.  ? benzonatate (TESSALON PERLES) 100 MG capsule Take 1 capsule (100 mg total) by mouth 3 (three) times daily as needed for cough.  ? Blood Pressure KIT 1 Units by Does not apply route daily. Check blood pressure daily for hypertension dx code I10  ? cephALEXin (KEFLEX) 500 MG capsule Take 1 capsule (500 mg total) by mouth 4 (four) times daily.  ? diclofenac Sodium (VOLTAREN) 1 % GEL Apply 4 g topically 4 (four) times daily. Apply to back  ? Dulaglutide (TRULICITY) 6.96 EX/5.2WU SOPN Inject 0.5 mg into the skin every 14 (fourteen) days.  ? Lancets (ONETOUCH DELICA PLUS [id number]) MISC USE TO CHECK BLOOD SUGAR TWICE DAILY  ? lidocaine (LIDODERM) 5 % Place 1 patch onto the skin daily.  Remove & Discard patch within 12 hours or as directed by MD  ? Magnesium 250 MG TABS Take 1 tablet (250 mg total) by mouth every evening. Take with dinner meal  ? Multiple Vitamin (MULTIVITAMIN) tablet Take 1 tablet by mouth daily.  ? Multiple Vitamins-Minerals (ZINC PO) Take 1 tablet by mouth as needed.  ? ONETOUCH VERIO test strip USE TO TEST 2 TIMES DAILY  ? rosuvastatin (CRESTOR) 20 MG tablet TAKE 1 TABLET(20 MG) BY MOUTH DAILY  ? Saccharomyces boulardii (PROBIOTIC) 250 MG CAPS 1 tablet by mouth daily  ? tiZANidine (ZANAFLEX) 4 MG tablet Take 0.5 tablets (2 mg total) by mouth every 6 (six) hours as needed for muscle spasms.  ? valsartan (DIOVAN) 80 MG tablet Take 1 tablet (80 mg total) by mouth daily.  ? vitamin B-12 (CYANOCOBALAMIN) 100 MCG tablet Take 1 tablet by mouth daily  ? Vitamin D, Ergocalciferol, (DRISDOL) 1.25 MG (50000 UNIT) CAPS capsule TAKE 1 CAPSULE BY MOUTH TWICE WEEKLY ON TUESDAYS AND FRIDAYS  ? ?No facility-administered encounter medications on file as of 02/02/2022.  ?Reviewed chart for medication changes ahead of medication coordination call. ? ?No medication changes indicated OR if recent visit, treatment plan here. ? ?BP Readings from Last 3 Encounters:  ?01/07/22 129/80  ?11/27/21 (!) 126/55  ?11/25/21 (!) 143/55  ?  ?Lab Results  ?Component Value Date  ? HGBA1C 6.5 (H) 11/03/2021  ?  ? ?Patient obtains medications through Adherence Packaging  30 Days  ? ?Last adherence delivery included:  ?Valsartan 80 mg before breakfast ?Rosuvastatin 20 mg daily before breakfast ?Allopurinol 300 mg  daily before breakfast ?Asa 81 mg daily before breakfast ?Atenolol-Chlorthalidone 50/25 mg daily before breakfast ?Vitamin D 1.25 mg- 1 tablet twice a week on Tuesdays and Fridays before breakfast ? ?Patient declined (meds) last month: ?None ? ?Patient is due for next adherence delivery on: 02-14-2022 ? ?Called patient and reviewed medications and coordinated delivery. ? ?This delivery to include: ?Valsartan 80  mg before breakfast ?Rosuvastatin 20 mg daily before breakfast ?Allopurinol 300 mg daily before breakfast ?Asa 81 mg daily before breakfast ?Atenolol-Chlorthalidone 50/25 mg daily before breakfast ?Vitamin D 1.25 mg- 1 tablet twice a week on Tuesdays and Fridays before breakfast ? ?No acute/short fill needed ? ?Patient declined the following medications: ?None ? ?Patient needs refills for: Refill request sent ?Rosuvastatin ?Allopurinol ?Aspirin ? ?Confirmed delivery date of 02-02-2022, advised patient that pharmacy will contact them the morning of delivery. ? ?Care Gaps: ?Shingrix overdue ?PNA Vac overdue ?AWV 06-22-2022 ? ?Star Rating Drugs: ?Rosuvastatin 20 mg- Last filled 01-09-2022 [facility] ?Valsartan 80 mg- Last filled 01-09-2022 [facility] ?Trulicity 0.5 mg- Patient assistance ? ?Malecca Hicks CMA ?Clinical Pharmacist Assistant ?[phone] ? ?

## 2022-02-09 DIAGNOSIS — E119 Type 2 diabetes mellitus without complications: Secondary | ICD-10-CM | POA: Diagnosis not present

## 2022-02-09 DIAGNOSIS — H43813 Vitreous degeneration, bilateral: Secondary | ICD-10-CM | POA: Diagnosis not present

## 2022-02-09 DIAGNOSIS — H5213 Myopia, bilateral: Secondary | ICD-10-CM | POA: Diagnosis not present

## 2022-02-09 DIAGNOSIS — Z961 Presence of intraocular lens: Secondary | ICD-10-CM | POA: Diagnosis not present

## 2022-02-20 ENCOUNTER — Encounter: Payer: Self-pay | Admitting: Podiatry

## 2022-02-20 ENCOUNTER — Ambulatory Visit: Payer: Medicare Other | Admitting: Podiatry

## 2022-02-20 DIAGNOSIS — M79609 Pain in unspecified limb: Secondary | ICD-10-CM

## 2022-02-20 DIAGNOSIS — B351 Tinea unguium: Secondary | ICD-10-CM | POA: Diagnosis not present

## 2022-02-26 NOTE — Progress Notes (Signed)
?  Subjective:  ?Patient ID: [name], female    DOB: 1940/12/11,  MRN: [id number] ? ?[name] presents to clinic today for preventative diabetic foot care and painful elongated mycotic toenails 1-5 bilaterally which are tender when wearing enclosed shoe gear. Pain is relieved with periodic professional debridement. ? ?Last known HgA1c was 6.1%. Patient did not check blood glucose today. ? ?New problem(s): None.  ? ?PCP is [name], MD , and last visit was November 03, 2021. ? ?Allergies  ?Allergen Reactions  ? Atorvastatin Itching  ?  Other reaction(s): Itching ?  ? Atorvastatin Calcium Itching  ? Bee Venom Hives  ? Hydrocodone-Acetaminophen Other (See Comments)  ?  hallucinations ?hallucinations  ? Oxycodone Other (See Comments)  ?  hallucinations  ? Vicodin [Hydrocodone-Acetaminophen] Other (See Comments)  ?  hallucinations  ? ?Review of Systems: Negative except as noted in the HPI. ? ?Objective: No changes noted in today's physical examination. ? ?Objective:  ? ?Vascular Examination: ?Vascular status intact b/l with palpable DP pulses b/l LE. Pedal hair present b/l. CFT immediate b/l. No edema. No pain with calf compression b/l. Skin temperature gradient WNL b/l. Pedal hair sparse. No pain with calf compression b/l. Lower extremity skin temperature gradient within normal limits. Nonpitting edema noted BLE. ? ?Neurological Examination: ?Sensation grossly intact b/l with 10 gram monofilament. Vibratory sensation intact b/l. Protective sensation intact 5/5 intact bilaterally with 10g monofilament b/l. Vibratory sensation intact b/l. ? ?Dermatological Examination: ?Pedal skin with normal turgor, texture and tone b/l. Toenails 1-5 b/l thick, discolored, elongated with subungual debris and pain on dorsal palpation. No hyperkeratotic lesions noted b/l.  ? ?Musculoskeletal Examination: ?Muscle strength 5/5 to b/l LE.  ? ?Radiographs: None ? ?Last A1c:   ? ?  Latest Ref Rng & Units 11/03/2021  ? 11:07 AM  07/12/2021  ? 10:45 AM 04/12/2021  ?  1:01 PM  ?Hemoglobin A1C  ?Hemoglobin-A1c 4.8 - 5.6 % 6.5   6.7   6.3    ? ?Assessment/Plan: ?1. Pain due to onychomycosis of nail   ?  ? ?-Patient was evaluated and treated. All patient's and/or POA's questions/concerns answered on today's visit. ?-Examined patient. ?-Toenails 1-5 b/l were debrided in length and girth with sterile nail nippers and dremel without iatrogenic bleeding.  ?-Patient/POA to call should there be question/concern in the interim.  ? ?Return in about 3 months (around 05/22/2022). ? ?[name], DPM  ?

## 2022-03-01 ENCOUNTER — Ambulatory Visit (INDEPENDENT_AMBULATORY_CARE_PROVIDER_SITE_OTHER): Payer: Medicare Other | Admitting: Nurse Practitioner

## 2022-03-01 ENCOUNTER — Encounter: Payer: Self-pay | Admitting: Nurse Practitioner

## 2022-03-01 VITALS — BP 126/66 | HR 60 | Temp 98.3°F | Ht 66.0 in | Wt 237.0 lb

## 2022-03-01 DIAGNOSIS — N1831 Chronic kidney disease, stage 3a: Secondary | ICD-10-CM

## 2022-03-01 DIAGNOSIS — I700 Atherosclerosis of aorta: Secondary | ICD-10-CM

## 2022-03-01 DIAGNOSIS — E782 Mixed hyperlipidemia: Secondary | ICD-10-CM | POA: Diagnosis not present

## 2022-03-01 DIAGNOSIS — E1122 Type 2 diabetes mellitus with diabetic chronic kidney disease: Secondary | ICD-10-CM

## 2022-03-01 DIAGNOSIS — R051 Acute cough: Secondary | ICD-10-CM | POA: Diagnosis not present

## 2022-03-01 DIAGNOSIS — R21 Rash and other nonspecific skin eruption: Secondary | ICD-10-CM | POA: Diagnosis not present

## 2022-03-01 DIAGNOSIS — E559 Vitamin D deficiency, unspecified: Secondary | ICD-10-CM

## 2022-03-01 DIAGNOSIS — Z6838 Body mass index (BMI) 38.0-38.9, adult: Secondary | ICD-10-CM

## 2022-03-01 DIAGNOSIS — I129 Hypertensive chronic kidney disease with stage 1 through stage 4 chronic kidney disease, or unspecified chronic kidney disease: Secondary | ICD-10-CM

## 2022-03-01 MED ORDER — BENZONATATE 100 MG PO CAPS: 100.0000 mg | ORAL_CAPSULE | Freq: Three times a day (TID) | ORAL | 1 refills | Status: DC | PRN

## 2022-03-01 MED ORDER — HYDROCORTISONE 1 % EX CREA: TOPICAL_CREAM | CUTANEOUS | 1 refills | Status: DC

## 2022-03-01 NOTE — Patient Instructions (Signed)
Hypertension, Adult High blood pressure (hypertension) is when the force of blood pumping through the arteries is too strong. The arteries are the blood vessels that carry blood from the heart throughout the body. Hypertension forces the heart to work harder to pump blood and may cause arteries to become narrow or stiff. Untreated or uncontrolled hypertension can lead to a heart attack, heart failure, a stroke, kidney disease, and other problems. A blood pressure reading consists of a higher number over a lower number. Ideally, your blood pressure should be below 120/80. The first ("top") number is called the systolic pressure. It is a measure of the pressure in your arteries as your heart beats. The second ("bottom") number is called the diastolic pressure. It is a measure of the pressure in your arteries as the heart relaxes. What are the causes? The exact cause of this condition is not known. There are some conditions that result in high blood pressure. What increases the risk? Certain factors may make you more likely to develop high blood pressure. Some of these risk factors are under your control, including: Smoking. Not getting enough exercise or physical activity. Being overweight. Having too much fat, sugar, calories, or salt (sodium) in your diet. Drinking too much alcohol. Other risk factors include: Having a personal history of heart disease, diabetes, high cholesterol, or kidney disease. Stress. Having a family history of high blood pressure and high cholesterol. Having obstructive sleep apnea. Age. The risk increases with age. What are the signs or symptoms? High blood pressure may not cause symptoms. Very high blood pressure (hypertensive crisis) may cause: Headache. Fast or irregular heartbeats (palpitations). Shortness of breath. Nosebleed. Nausea and vomiting. Vision changes. Severe chest pain, dizziness, and seizures. How is this diagnosed? This condition is diagnosed by  measuring your blood pressure while you are seated, with your arm resting on a flat surface, your legs uncrossed, and your feet flat on the floor. The cuff of the blood pressure monitor will be placed directly against the skin of your upper arm at the level of your heart. Blood pressure should be measured at least twice using the same arm. Certain conditions can cause a difference in blood pressure between your right and left arms. If you have a high blood pressure reading during one visit or you have normal blood pressure with other risk factors, you may be asked to: Return on a different day to have your blood pressure checked again. Monitor your blood pressure at home for 1 week or longer. If you are diagnosed with hypertension, you may have other blood or imaging tests to help your health care provider understand your overall risk for other conditions. How is this treated? This condition is treated by making healthy lifestyle changes, such as eating healthy foods, exercising more, and reducing your alcohol intake. You may be referred for counseling on a healthy diet and physical activity. Your health care provider may prescribe medicine if lifestyle changes are not enough to get your blood pressure under control and if: Your systolic blood pressure is above 130. Your diastolic blood pressure is above 80. Your personal target blood pressure may vary depending on your medical conditions, your age, and other factors. Follow these instructions at home: Eating and drinking  Eat a diet that is high in fiber and potassium, and low in sodium, added sugar, and fat. An example of this eating plan is called the DASH diet. DASH stands for Dietary Approaches to Stop Hypertension. To eat this way: Eat   plenty of fresh fruits and vegetables. Try to fill one half of your plate at each meal with fruits and vegetables. Eat whole grains, such as whole-wheat pasta, brown rice, or whole-grain bread. Fill about one  fourth of your plate with whole grains. Eat or drink low-fat dairy products, such as skim milk or low-fat yogurt. Avoid fatty cuts of meat, processed or cured meats, and poultry with skin. Fill about one fourth of your plate with lean proteins, such as fish, chicken without skin, beans, eggs, or tofu. Avoid pre-made and processed foods. These tend to be higher in sodium, added sugar, and fat. Reduce your daily sodium intake. Many people with hypertension should eat less than 1,500 mg of sodium a day. Do not drink alcohol if: Your health care provider tells you not to drink. You are pregnant, may be pregnant, or are planning to become pregnant. If you drink alcohol: Limit how much you have to: 0-1 drink a day for women. 0-2 drinks a day for men. Know how much alcohol is in your drink. In the [place] one drink equals one 12 oz bottle of beer (355 mL), one 5 oz glass of wine (148 mL), or one 1 oz glass of hard liquor (44 mL). Lifestyle  Work with your health care provider to maintain a healthy body weight or to lose weight. Ask what an ideal weight is for you. Get at least 30 minutes of exercise that causes your heart to beat faster (aerobic exercise) most days of the week. Activities may include walking, swimming, or biking. Include exercise to strengthen your muscles (resistance exercise), such as Pilates or lifting weights, as part of your weekly exercise routine. Try to do these types of exercises for 30 minutes at least 3 days a week. Do not use any products that contain nicotine or tobacco. These products include cigarettes, chewing tobacco, and vaping devices, such as e-cigarettes. If you need help quitting, ask your health care provider. Monitor your blood pressure at home as told by your health care provider. Keep all follow-up visits. This is important. Medicines Take over-the-counter and prescription medicines only as told by your health care provider. Follow directions carefully. Blood  pressure medicines must be taken as prescribed. Do not skip doses of blood pressure medicine. Doing this puts you at risk for problems and can make the medicine less effective. Ask your health care provider about side effects or reactions to medicines that you should watch for. Contact a health care provider if you: Think you are having a reaction to a medicine you are taking. Have headaches that keep coming back (recurring). Feel dizzy. Have swelling in your ankles. Have trouble with your vision. Get help right away if you: Develop a severe headache or confusion. Have unusual weakness or numbness. Feel faint. Have severe pain in your chest or abdomen. Vomit repeatedly. Have trouble breathing. These symptoms may be an emergency. Get help right away. Call 911. Do not wait to see if the symptoms will go away. Do not drive yourself to the hospital. Summary Hypertension is when the force of blood pumping through your arteries is too strong. If this condition is not controlled, it may put you at risk for serious complications. Your personal target blood pressure may vary depending on your medical conditions, your age, and other factors. For most people, a normal blood pressure is less than 120/80. Hypertension is treated with lifestyle changes, medicines, or a combination of both. Lifestyle changes include losing weight, eating a healthy,   low-sodium diet, exercising more, and limiting alcohol. This information is not intended to replace advice given to you by your health care provider. Make sure you discuss any questions you have with your health care provider. Document Revised: 08/23/2021 Document Reviewed: 08/23/2021 Elsevier Patient Education  2023 [employer] Inc.  

## 2022-03-01 NOTE — Progress Notes (Signed)
?Industrial/product designer as a Education administrator for [employer], FNP.,have documented all relevant documentation on the behalf of [name], FNP,as directed by  [name], FNP while in the presence of [name], [place]. ? ?This visit occurred during the SARS-CoV-2 public health emergency.  Safety protocols were in place, including screening questions prior to the visit, additional usage of staff PPE, and extensive cleaning of exam room while observing appropriate contact time as indicated for disinfecting solutions. ? ?Subjective:  ?  ? Patient ID: [name] , female    DOB: 03-17-1941 , 81 y.o.   MRN: [id number] ? ? ?Chief Complaint  ?Patient presents with  ? Diabetes  ? Hypertension  ? ? ?HPI ? ?The patient is here for a diabetes and blood pressure, vitamin d  follow-up.  Once start getting up and moving around does not have as much of a cough. Notices more in throat area. Reports drinking "a lot of water" she also drinks cocunut water.  ? ?Diabetes ?She presents for her follow-up diabetic visit. She has type 2 diabetes mellitus. There are no hypoglycemic associated symptoms. Pertinent negatives for diabetes include no blurred vision and no chest pain. There are no hypoglycemic complications. Risk factors for coronary artery disease include diabetes mellitus, dyslipidemia, hypertension, obesity, sedentary lifestyle and post-menopausal. She is compliant with treatment most of the time. She participates in exercise intermittently. An ACE inhibitor/angiotensin II receptor blocker is being taken. Eye exam is current.  ?Hypertension ?This is a chronic problem. The current episode started more than 1 year ago. The problem has been gradually improving since onset. The problem is controlled. Pertinent negatives include no blurred vision or chest pain. Risk factors for coronary artery disease include diabetes mellitus, dyslipidemia, obesity, post-menopausal state and sedentary lifestyle.  ?Cough ?This is a new problem.  The current episode started more than 1 month ago. The problem has been gradually improving. The problem occurs every few hours. Cough characteristics: green sputum. Associated symptoms include a rash (left arm rash noticed after starting valsartan from cardiology). Pertinent negatives include no chest pain.   ? ?Past Medical History:  ?Diagnosis Date  ? Abdominal pain   ? Arthritis   ? cervical disc degeneration/ oa left knee, carpal tunnel rt wrist, adhesive capsulitis right shoulder, rt hand weakness; lumbar degeneration  ? Carpal tunnel syndrome   ? Complication of anesthesia 2006-at [facility]  ? 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 Constipation   ? Diabetes mellitus without complication ([place])   ? borderline - diet control  ? Diverticulitis hx of  ? Diverticulosis   ? E. coli infection   ? 2020  ? Frequent UTI   ? hx of urethral injury during colon surgery - states frequent uti's since  ? GERD (gastroesophageal reflux disease)   ? H/O hiatal hernia   ? History of palpitations   ? in the past  ? History of shingles   ? has a lingering itching on back where shingles were  ? Hyperlipidemia   ? Hypertension   ? Numbness and tingling in right hand   ? pt. states has numbness of right hand very frequently-watch positioning  ? Obesity   ? Osteoarthritis   ? Osteoporosis   ? Pain   ? pain left knee and pain right hip and right groin  ? Personal history of colonic polyps-adenoma 08/26/2008  ?  Pneumonia 2005  ? Shortness of breath 09/14/2021  ? Vitamin D deficiency   ?  ? ?Family History  ?Problem Relation Age of Onset  ? Breast cancer Mother 73  ? Hypertension Mother   ? Prostate cancer Father   ? Hypertension Father   ? Dementia Sister   ? Lung cancer Brother   ? Hypertension Brother   ? COPD Brother   ? Cancer Maternal Grandmother   ? Arthritis Sister   ? Hypertension Sister   ? Arthritis  Sister   ? Hypertension Child   ? Hypertension Child   ? Hypertension Child   ? Colon polyps Neg Hx   ? Esophageal cancer Neg Hx   ? Rectal cancer Neg Hx   ? Stomach cancer Neg Hx   ? Colon cancer Neg Hx   ? ? ? ?Current Outpatient Medications:  ?  hydrocortisone cream 1 %, Apply to affected area 2 times daily, Disp: 30 g, Rfl: 1 ?  allopurinol (ZYLOPRIM) 300 MG tablet, Take 1 tablet by mouth daily, Disp: 90 tablet, Rfl: 1 ?  aspirin EC 81 MG tablet, Take 1 tablet (81 mg total) by mouth daily., Disp: 90 tablet, Rfl: 1 ?  atenolol-chlorthalidone (TENORETIC) 50-25 MG tablet, Take 1 tablet by mouth daily., Disp: 90 tablet, Rfl: 1 ?  benzonatate (TESSALON PERLES) 100 MG capsule, Take 1 capsule (100 mg total) by mouth 3 (three) times daily as needed for cough., Disp: 30 capsule, Rfl: 1 ?  Blood Pressure KIT, 1 Units by Does not apply route daily. Check blood pressure daily for hypertension dx code I10, Disp: 1 kit, Rfl: 0 ?  diclofenac Sodium (VOLTAREN) 1 % GEL, Apply 4 g topically 4 (four) times daily. Apply to back, Disp: 100 g, Rfl: 0 ?  Dulaglutide (TRULICITY) 5.45 GY/5.6LS SOPN, Inject 0.5 mg into the skin every 14 (fourteen) days., Disp: , Rfl:  ?  Lancets (ONETOUCH DELICA PLUS LHTDSK87G) MISC, USE TO CHECK BLOOD SUGAR TWICE DAILY, Disp: 100 each, Rfl: 3 ?  lidocaine (LIDODERM) 5 %, Place 1 patch onto the skin daily. Remove & Discard patch within 12 hours or as directed by MD, Disp: 30 patch, Rfl: 0 ?  Magnesium 250 MG TABS, Take 1 tablet (250 mg total) by mouth every evening. Take with dinner meal, Disp: 90 tablet, Rfl: 1 ?  Multiple Vitamin (MULTIVITAMIN) tablet, Take 1 tablet by mouth daily., Disp: 90 tablet, Rfl: 1 ?  Multiple Vitamins-Minerals (ZINC PO), Take 1 tablet by mouth as needed., Disp: , Rfl:  ?  ONETOUCH VERIO test strip, USE TO TEST 2 TIMES DAILY, Disp: 100 strip, Rfl: 3 ?  rosuvastatin (CRESTOR) 20 MG tablet, TAKE 1 TABLET(20 MG) BY MOUTH DAILY, Disp: 90 tablet, Rfl: 1 ?  Saccharomyces boulardii  (PROBIOTIC) 250 MG CAPS, 1 tablet by mouth daily, Disp: 90 capsule, Rfl: 1 ?  solifenacin (VESICARE) 5 MG tablet, Take 5 mg by mouth every morning., Disp: , Rfl:  ?  tiZANidine (ZANAFLEX) 4 MG tablet, Take 0.5 tablets (2 mg total) by mouth every 6 (six) hours as needed for muscle spasms., Disp: 30 tablet, Rfl: 0 ?  valsartan (DIOVAN) 80 MG tablet, Take 1 tablet (80 mg total) by mouth daily., Disp: 90 tablet, Rfl: 3 ?  vitamin B-12 (CYANOCOBALAMIN) 100 MCG tablet, Take 1 tablet by mouth daily, Disp: 90 tablet, Rfl: 1 ?  Vitamin D, Ergocalciferol, (DRISDOL) 1.25 MG (50000 UNIT) CAPS capsule, TAKE 1 CAPSULE BY MOUTH TWICE WEEKLY ON TUESDAYS AND FRIDAYS, Disp:  26 capsule, Rfl: 0  ? ?Allergies  ?Allergen Reactions  ? Atorvastatin Itching  ?  Other reaction(s): Itching ?  ? Atorvastatin Calcium Itching  ? Bee Venom Hives  ? Hydrocodone-Acetaminophen Other (See Comments)  ?  hallucinations ?hallucinations  ? Oxycodone Other (See Comments)  ?  hallucinations  ? Vicodin [Hydrocodone-Acetaminophen] Other (See Comments)  ?  hallucinations  ?  ? ?Review of Systems  ?Constitutional: Negative.   ?Eyes:  Negative for blurred vision.  ?Respiratory:  Positive for cough.   ?Cardiovascular: Negative.  Negative for chest pain.  ?Gastrointestinal: Negative.   ?Skin:  Positive for rash (left arm rash noticed after starting valsartan from cardiology).  ?Neurological: Negative.    ? ?Today's Vitals  ? 03/01/22 1016  ?BP: 126/66  ?Pulse: 60  ?Temp: 98.3 ?F (36.8 ?C)  ?TempSrc: Oral  ?Weight: 237 lb (107.5 kg)  ?Height: _0  (1.676 m)  ? ?Body mass index is 38.25 kg/m?.  ?Wt Readings from Last 3 Encounters:  ?03/01/22 237 lb (107.5 kg)  ?11/27/21 243 lb (110.2 kg)  ?11/25/21 243 lb (110.2 kg)  ? ? ?Objective:  ?Physical Exam ?Vitals reviewed.  ?Constitutional:   ?   Appearance: She is well-developed.  ?HENT:  ?   Head: Normocephalic and atraumatic.  ?Eyes:  ?   Pupils: Pupils are equal, round, and reactive to light.  ?Cardiovascular:  ?    Rate and Rhythm: Normal rate and regular rhythm.  ?   Pulses: Normal pulses.  ?   Heart sounds: Normal heart sounds. No murmur heard. ?Pulmonary:  ?   Effort: Pulmonary effort is normal. No respiratory distre

## 2022-03-02 ENCOUNTER — Other Ambulatory Visit: Payer: Self-pay

## 2022-03-02 ENCOUNTER — Emergency Department (HOSPITAL_BASED_OUTPATIENT_CLINIC_OR_DEPARTMENT_OTHER): Payer: Medicare Other

## 2022-03-02 ENCOUNTER — Emergency Department (HOSPITAL_BASED_OUTPATIENT_CLINIC_OR_DEPARTMENT_OTHER): Payer: Medicare Other | Admitting: Radiology

## 2022-03-02 ENCOUNTER — Encounter (HOSPITAL_BASED_OUTPATIENT_CLINIC_OR_DEPARTMENT_OTHER): Payer: Self-pay | Admitting: Emergency Medicine

## 2022-03-02 ENCOUNTER — Emergency Department (HOSPITAL_BASED_OUTPATIENT_CLINIC_OR_DEPARTMENT_OTHER)
Admission: EM | Admit: 2022-03-02 | Discharge: 2022-03-02 | Disposition: A | Discharge status: Home / Self Care | Payer: Medicare Other | Attending: Emergency Medicine | Admitting: Emergency Medicine

## 2022-03-02 DIAGNOSIS — M25511 Pain in right shoulder: Secondary | ICD-10-CM | POA: Diagnosis not present

## 2022-03-02 DIAGNOSIS — R519 Headache, unspecified: Secondary | ICD-10-CM | POA: Diagnosis not present

## 2022-03-02 DIAGNOSIS — S80211A Abrasion, right knee, initial encounter: Secondary | ICD-10-CM | POA: Diagnosis not present

## 2022-03-02 DIAGNOSIS — T148XXA Other injury of unspecified body region, initial encounter: Secondary | ICD-10-CM

## 2022-03-02 DIAGNOSIS — W19XXXA Unspecified fall, initial encounter: Secondary | ICD-10-CM

## 2022-03-02 DIAGNOSIS — M25512 Pain in left shoulder: Secondary | ICD-10-CM | POA: Diagnosis not present

## 2022-03-02 DIAGNOSIS — W01198A Fall on same level from slipping, tripping and stumbling with subsequent striking against other object, initial encounter: Secondary | ICD-10-CM | POA: Diagnosis not present

## 2022-03-02 DIAGNOSIS — Z79899 Other long term (current) drug therapy: Secondary | ICD-10-CM | POA: Diagnosis not present

## 2022-03-02 DIAGNOSIS — S39021A Laceration of muscle, fascia and tendon of abdomen, initial encounter: Secondary | ICD-10-CM | POA: Diagnosis not present

## 2022-03-02 DIAGNOSIS — M542 Cervicalgia: Secondary | ICD-10-CM | POA: Diagnosis not present

## 2022-03-02 DIAGNOSIS — R079 Chest pain, unspecified: Secondary | ICD-10-CM | POA: Diagnosis not present

## 2022-03-02 DIAGNOSIS — Z7982 Long term (current) use of aspirin: Secondary | ICD-10-CM | POA: Insufficient documentation

## 2022-03-02 DIAGNOSIS — Z043 Encounter for examination and observation following other accident: Secondary | ICD-10-CM | POA: Diagnosis not present

## 2022-03-02 DIAGNOSIS — S8991XA Unspecified injury of right lower leg, initial encounter: Secondary | ICD-10-CM | POA: Diagnosis present

## 2022-03-02 DIAGNOSIS — M25561 Pain in right knee: Secondary | ICD-10-CM | POA: Diagnosis not present

## 2022-03-02 LAB — VITAMIN D 25 HYDROXY (VIT D DEFICIENCY, FRACTURES): Vit D, 25-Hydroxy: 62.6 ng/mL (ref 30.0–100.0)

## 2022-03-02 LAB — BMP8+EGFR
BUN/Creatinine Ratio: 19 (ref 12–28)
BUN: 21 mg/dL (ref 8–27)
CO2: 23 mmol/L (ref 20–29)
Calcium: 9.5 mg/dL (ref 8.7–10.3)
Chloride: 101 mmol/L (ref 96–106)
Creatinine, Ser: 1.08 mg/dL — ABNORMAL HIGH (ref 0.57–1.00)
Glucose: 152 mg/dL — ABNORMAL HIGH (ref 70–99)
Potassium: 4.1 mmol/L (ref 3.5–5.2)
Sodium: 140 mmol/L (ref 134–144)
eGFR: 52 mL/min/{1.73_m2} — ABNORMAL LOW (ref >59)

## 2022-03-02 LAB — LIPID PANEL
Chol/HDL Ratio: 4.4 ratio (ref 0.0–4.4)
Cholesterol, Total: 171 mg/dL (ref 100–199)
HDL: 39 mg/dL — ABNORMAL LOW (ref >39)
LDL Chol Calc (NIH): 103 mg/dL — ABNORMAL HIGH (ref 0–99)
Triglycerides: 162 mg/dL — ABNORMAL HIGH (ref 0–149)
VLDL Cholesterol Cal: 29 mg/dL (ref 5–40)

## 2022-03-02 LAB — HEMOGLOBIN A1C
Est. average glucose Bld gHb Est-mCnc: 151 mg/dL
Hgb A1c MFr Bld: 6.9 % — ABNORMAL HIGH (ref 4.8–5.6)

## 2022-03-02 IMAGING — DX DG CHEST 2V
2 series · 2 of 2 positions shown · non-contrast
Comparison: Chest radiograph dated [DATE].

CLINICAL DATA: Chest pain and fall.

EXAM:
CHEST - 2 VIEW

[chest pa]
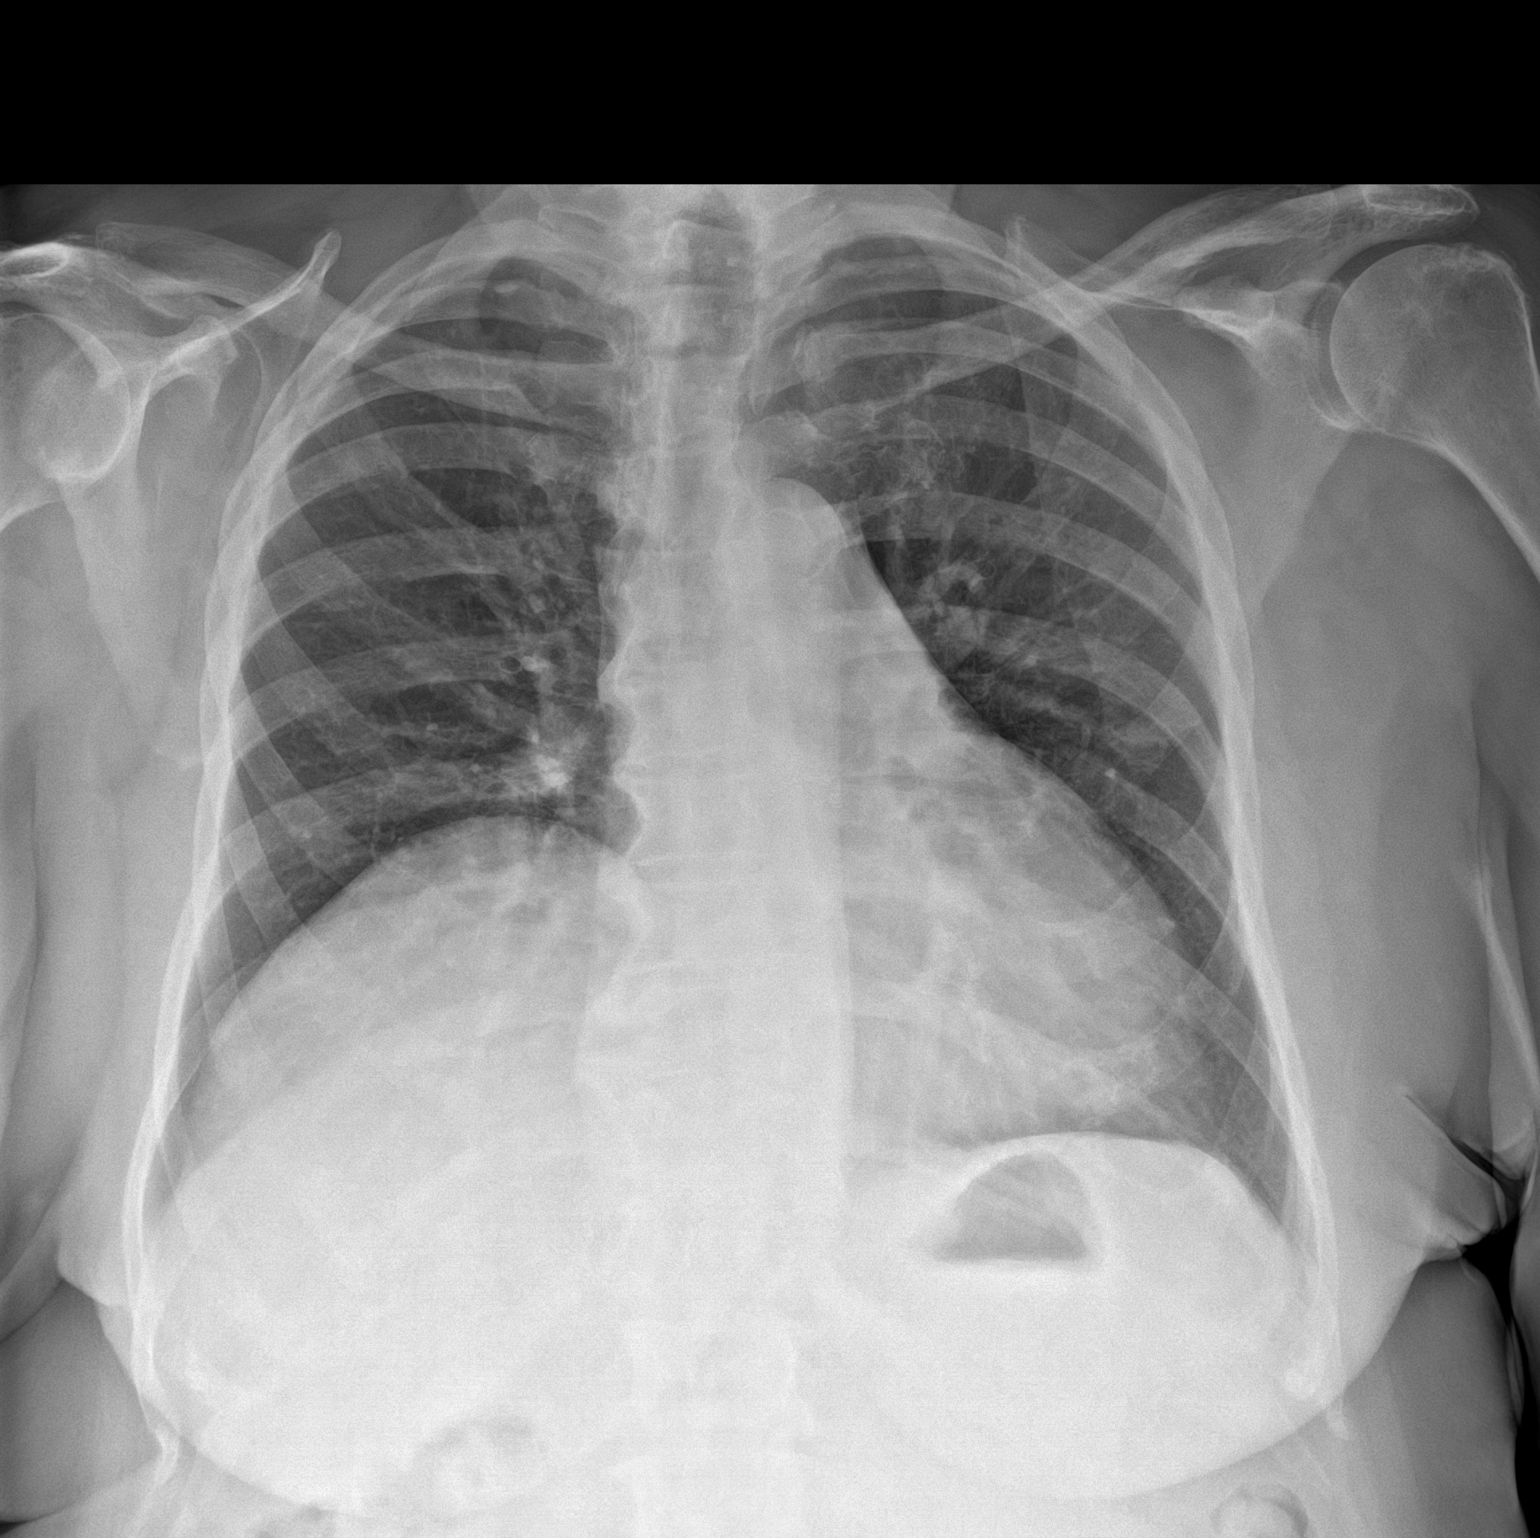

[chest lat]
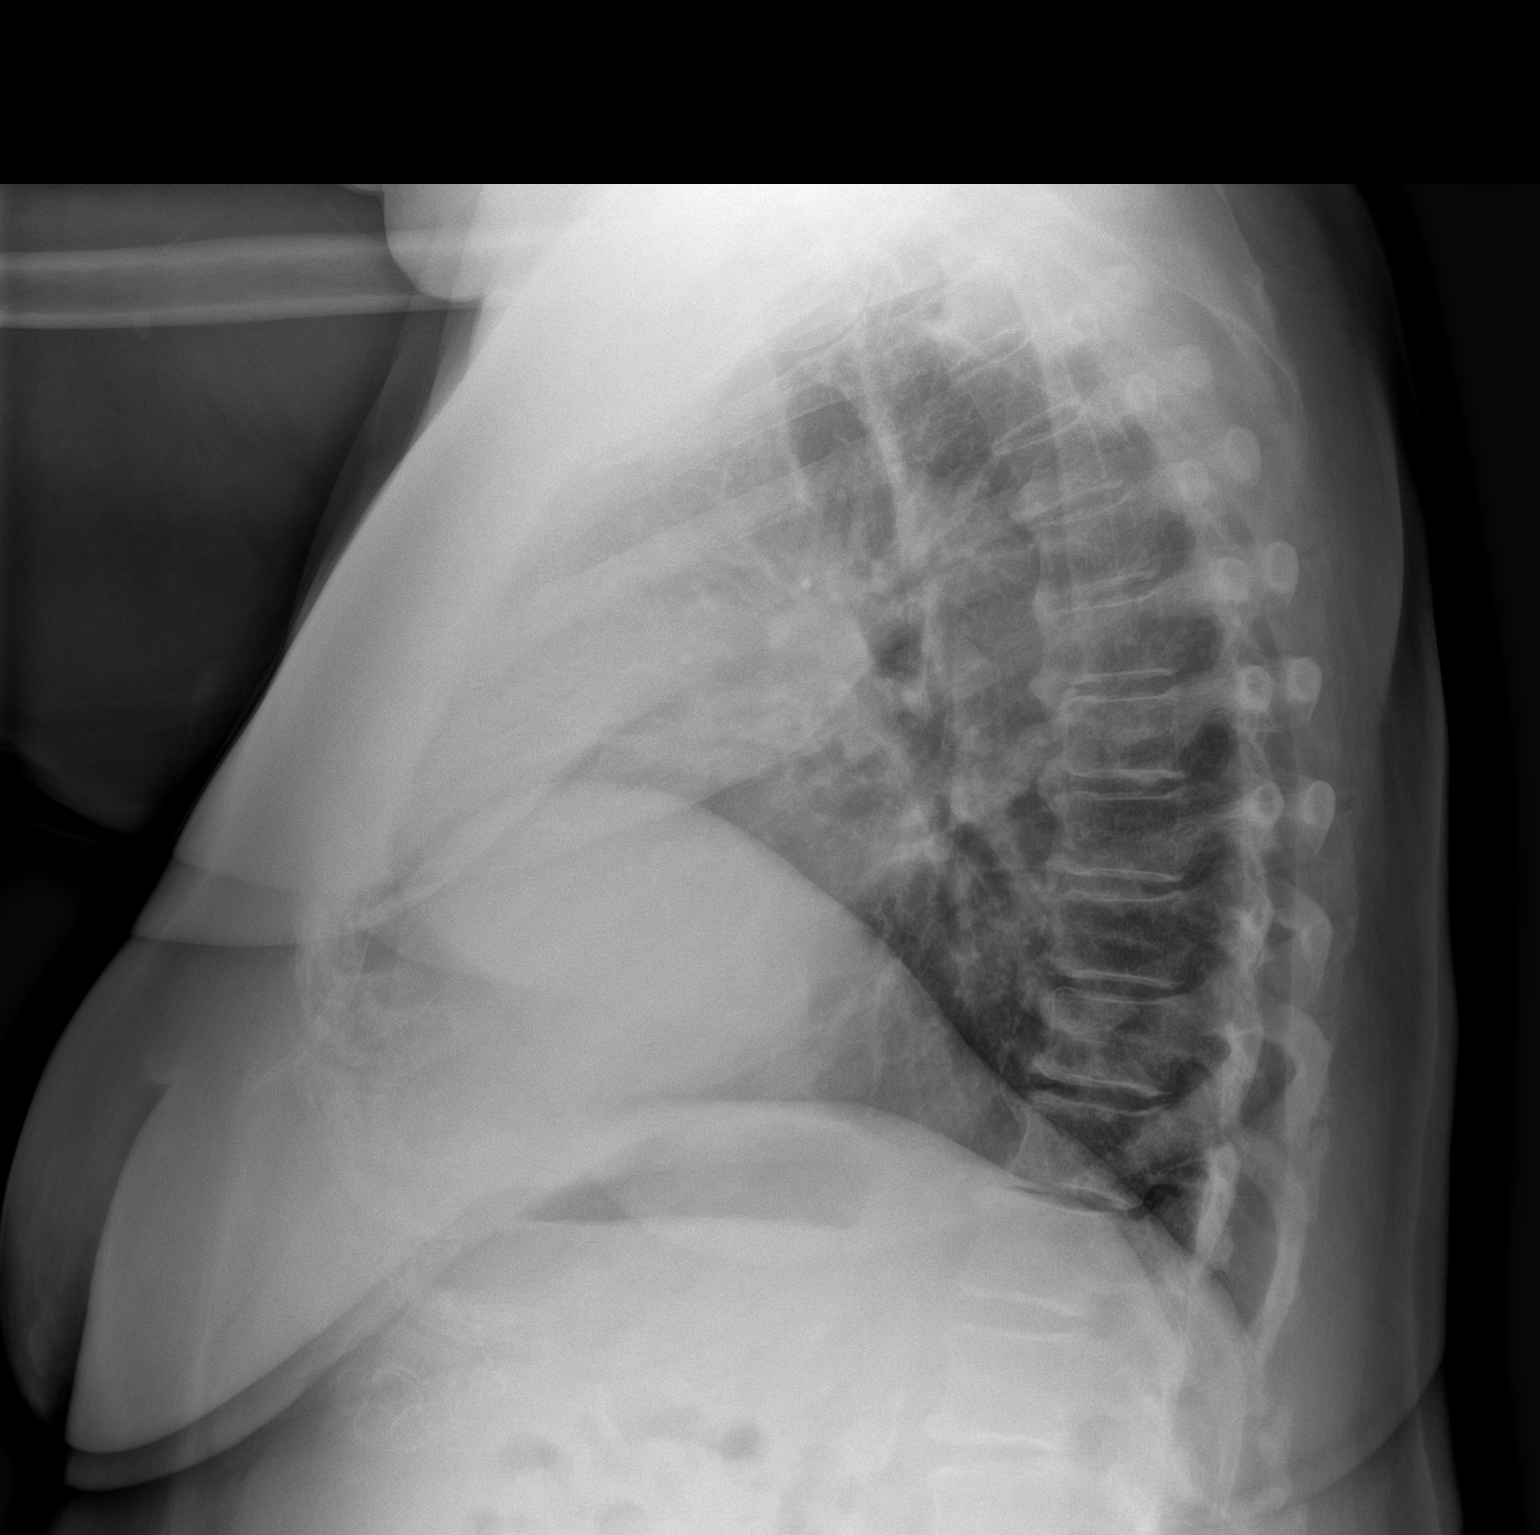

[2 of 2 positions shown; findings below may reference images not displayed]

FINDINGS: There is eventration of the right hemidiaphragm. No focal
consolidation, pleural effusion or pneumothorax. Stable mild
cardiomegaly. Atherosclerotic calcification of the aortic arch. No
acute osseous pathology. Degenerative changes of the
IMPRESSION: No acute cardiopulmonary process.

## 2022-03-02 IMAGING — DX DG KNEE COMPLETE 4+V*R*
4 series · 4 of 4 positions shown · non-contrast
Comparison: Right knee radiographs [DATE]

CLINICAL DATA: Fall.  Right knee pain.

EXAM:
RIGHT KNEE - COMPLETE 4+ VIEW

[knee ap]
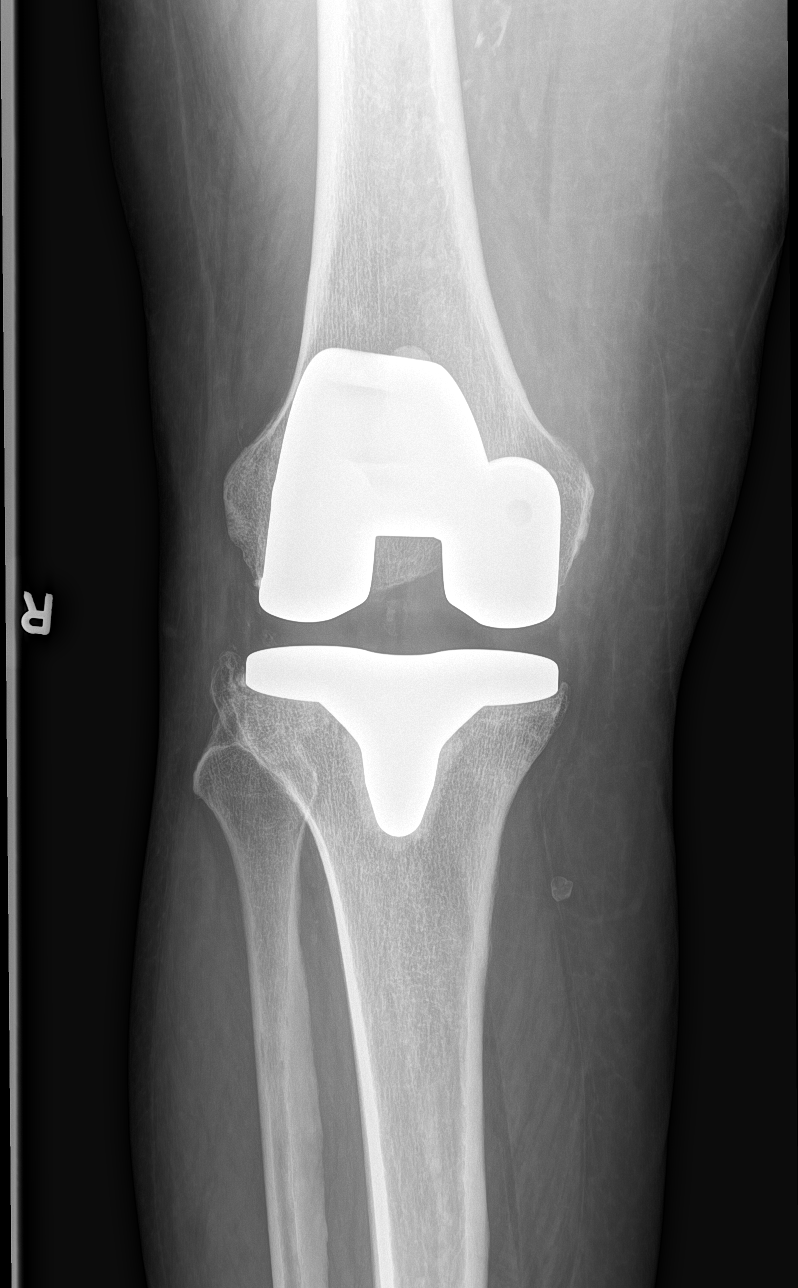

[knee obl (1 of 2)]
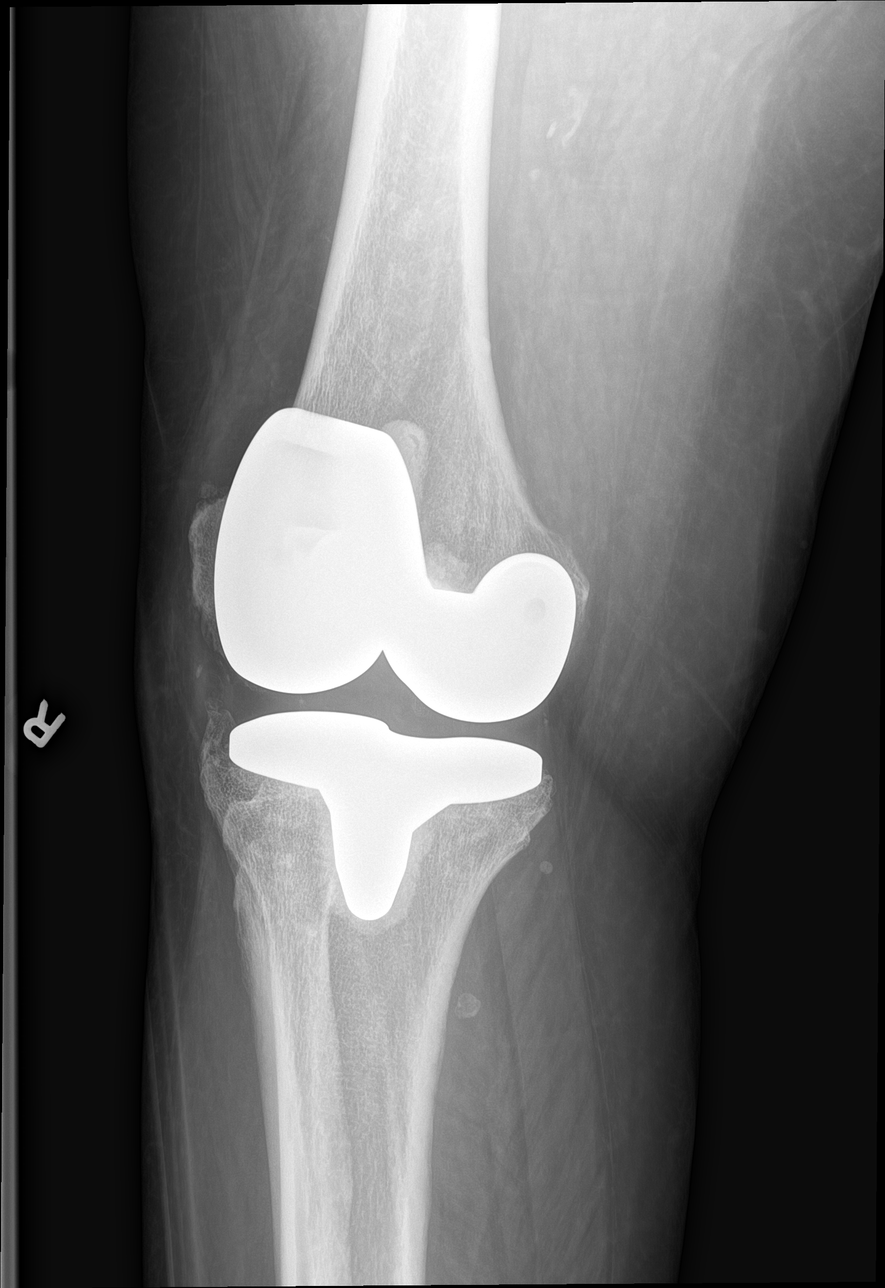

[knee obl (2 of 2)]
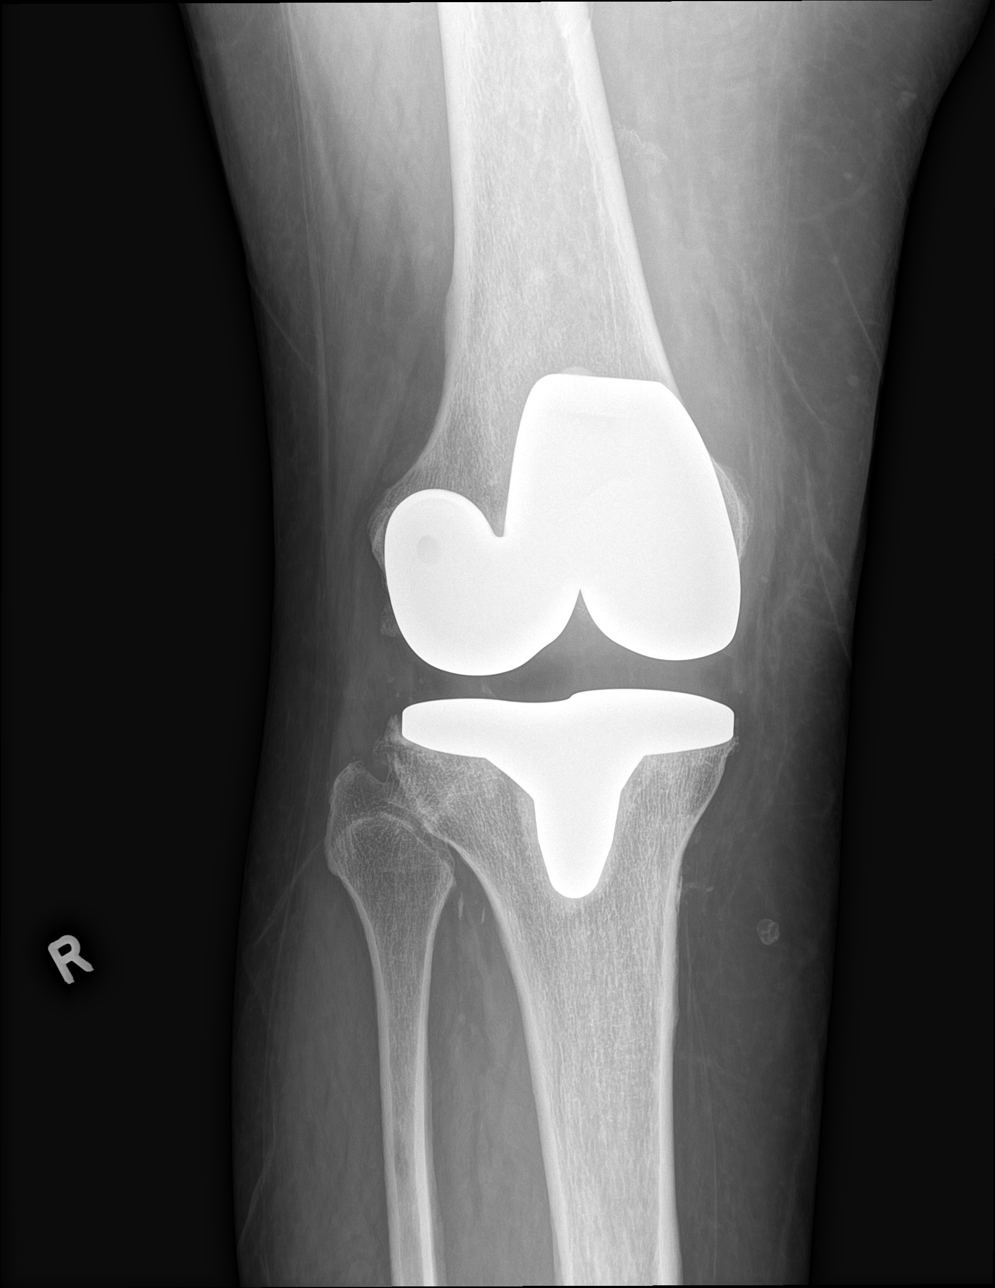

[knee lat]
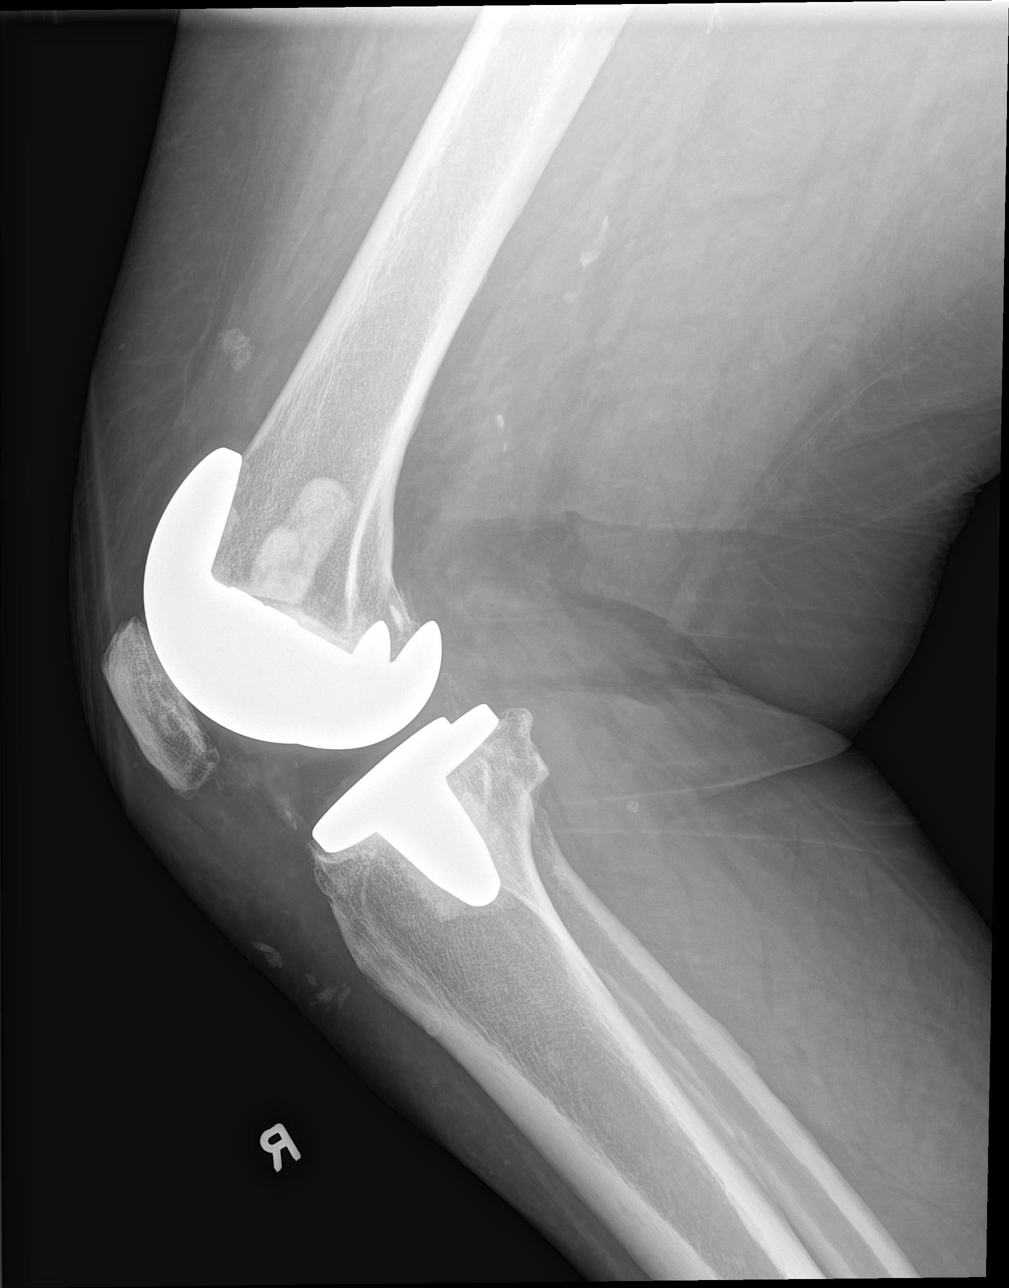

[4 of 4 positions shown; findings below may reference images not displayed]

FINDINGS: Redemonstration of total right knee arthroplasty. No perihardware
lucency is seen to indicate hardware failure or loosening. No joint
effusion. No acute fracture or dislocation.
IMPRESSION: No acute fracture. No evidence of right knee arthroplasty hardware
failure.

## 2022-03-02 IMAGING — DX DG SHOULDER 2+V*L*
3 series · 3 of 3 positions shown · non-contrast
Comparison: None Available.

CLINICAL DATA: Fall

EXAM:
LEFT SHOULDER - 2+ VIEW

[shoulder grashey]
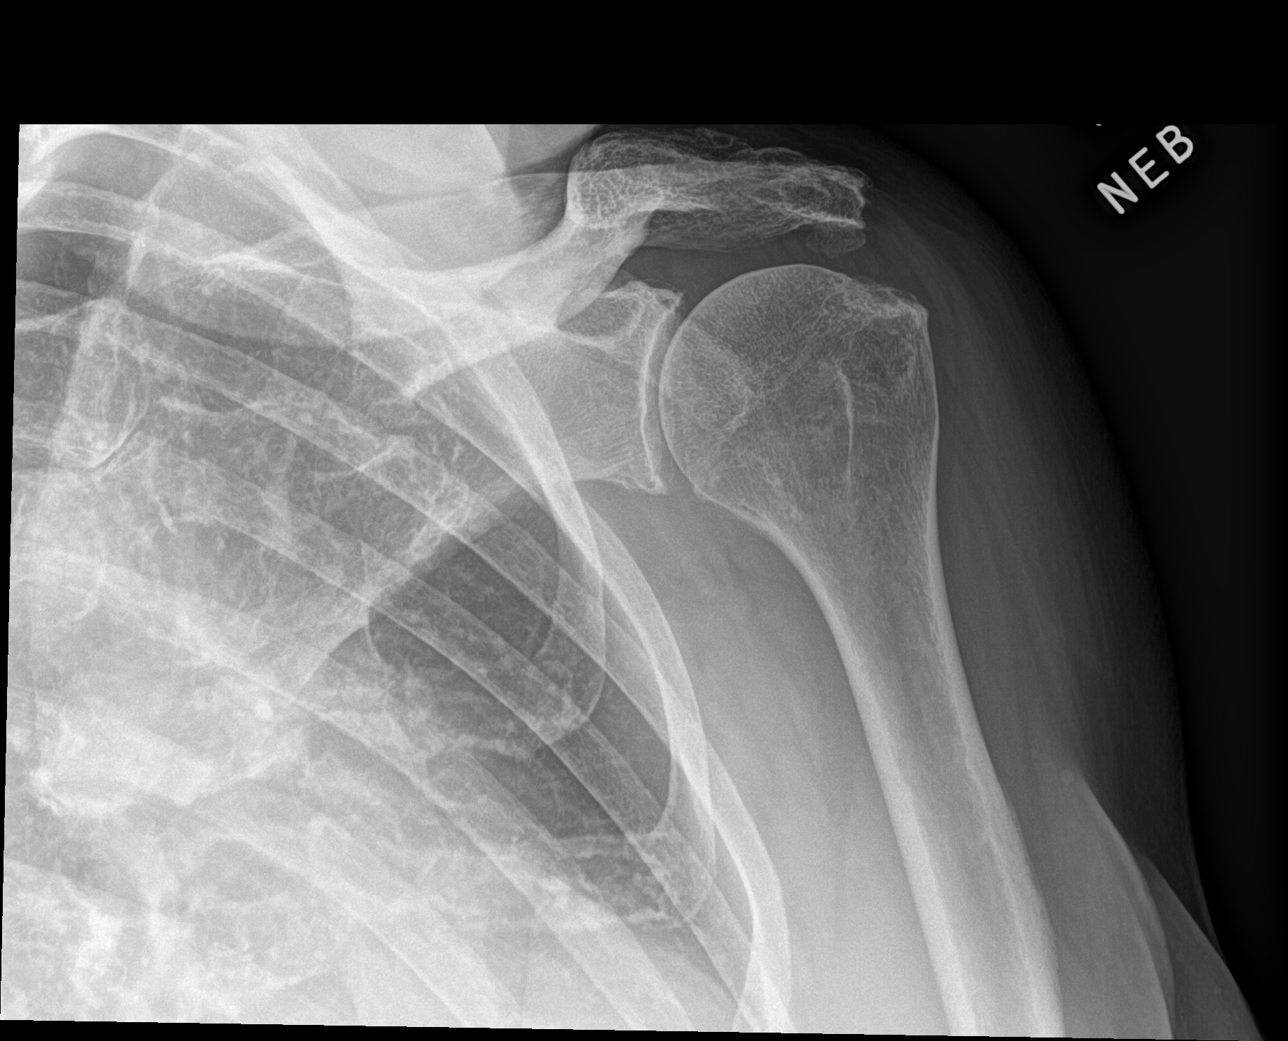

[shoulder y view]
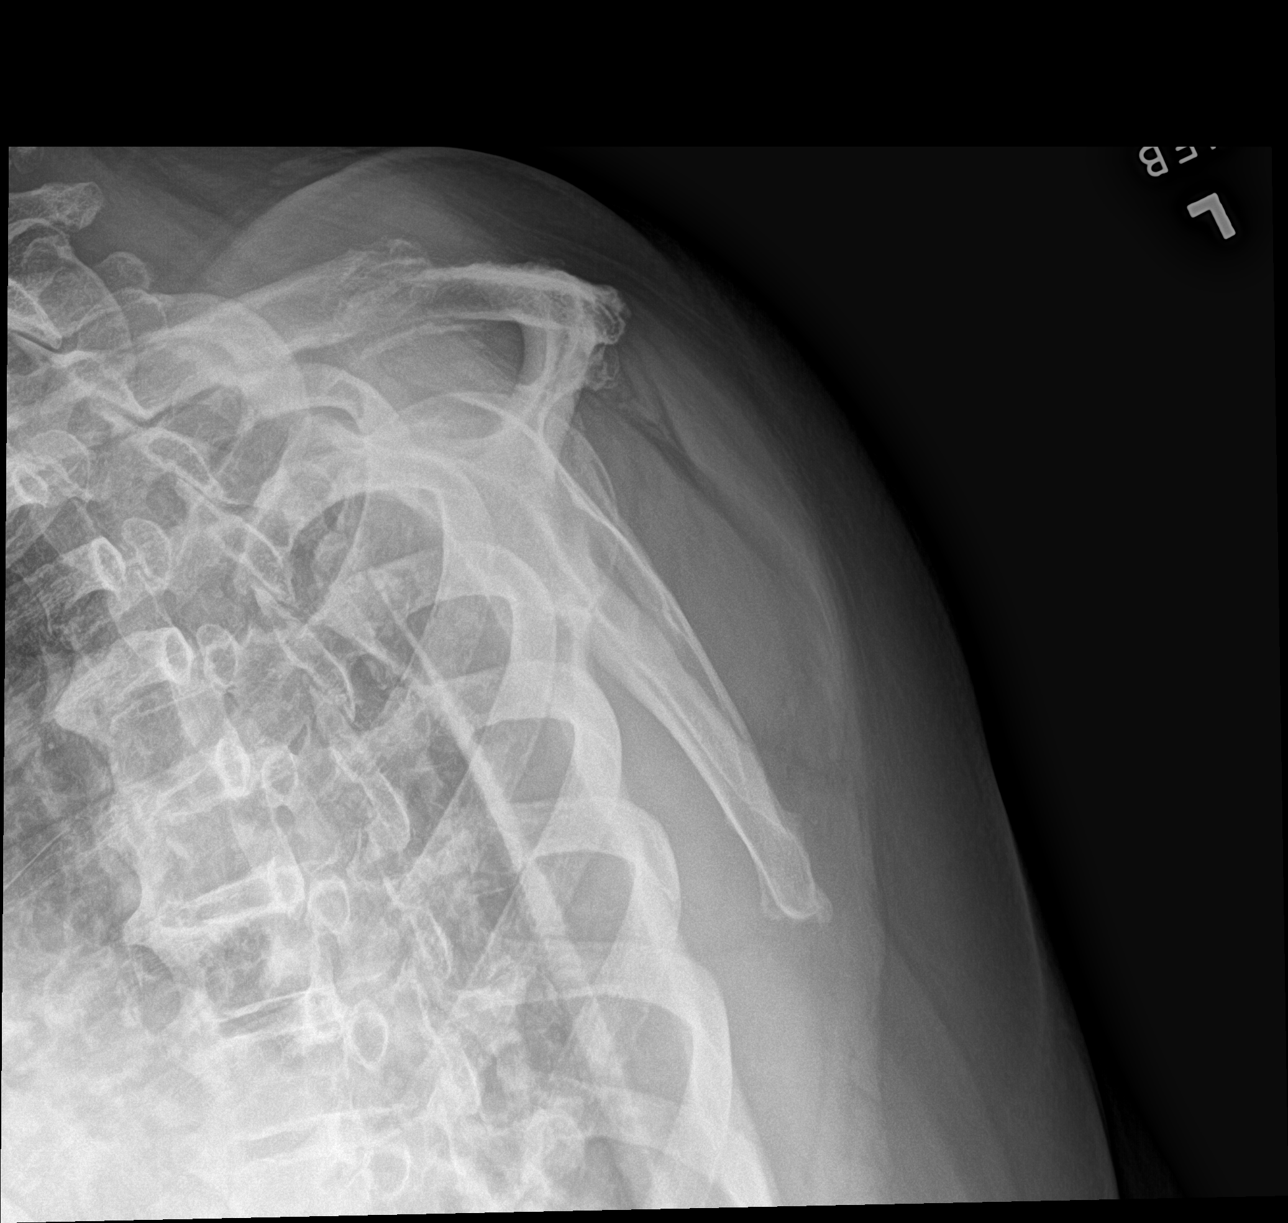

[shoulder axillary]
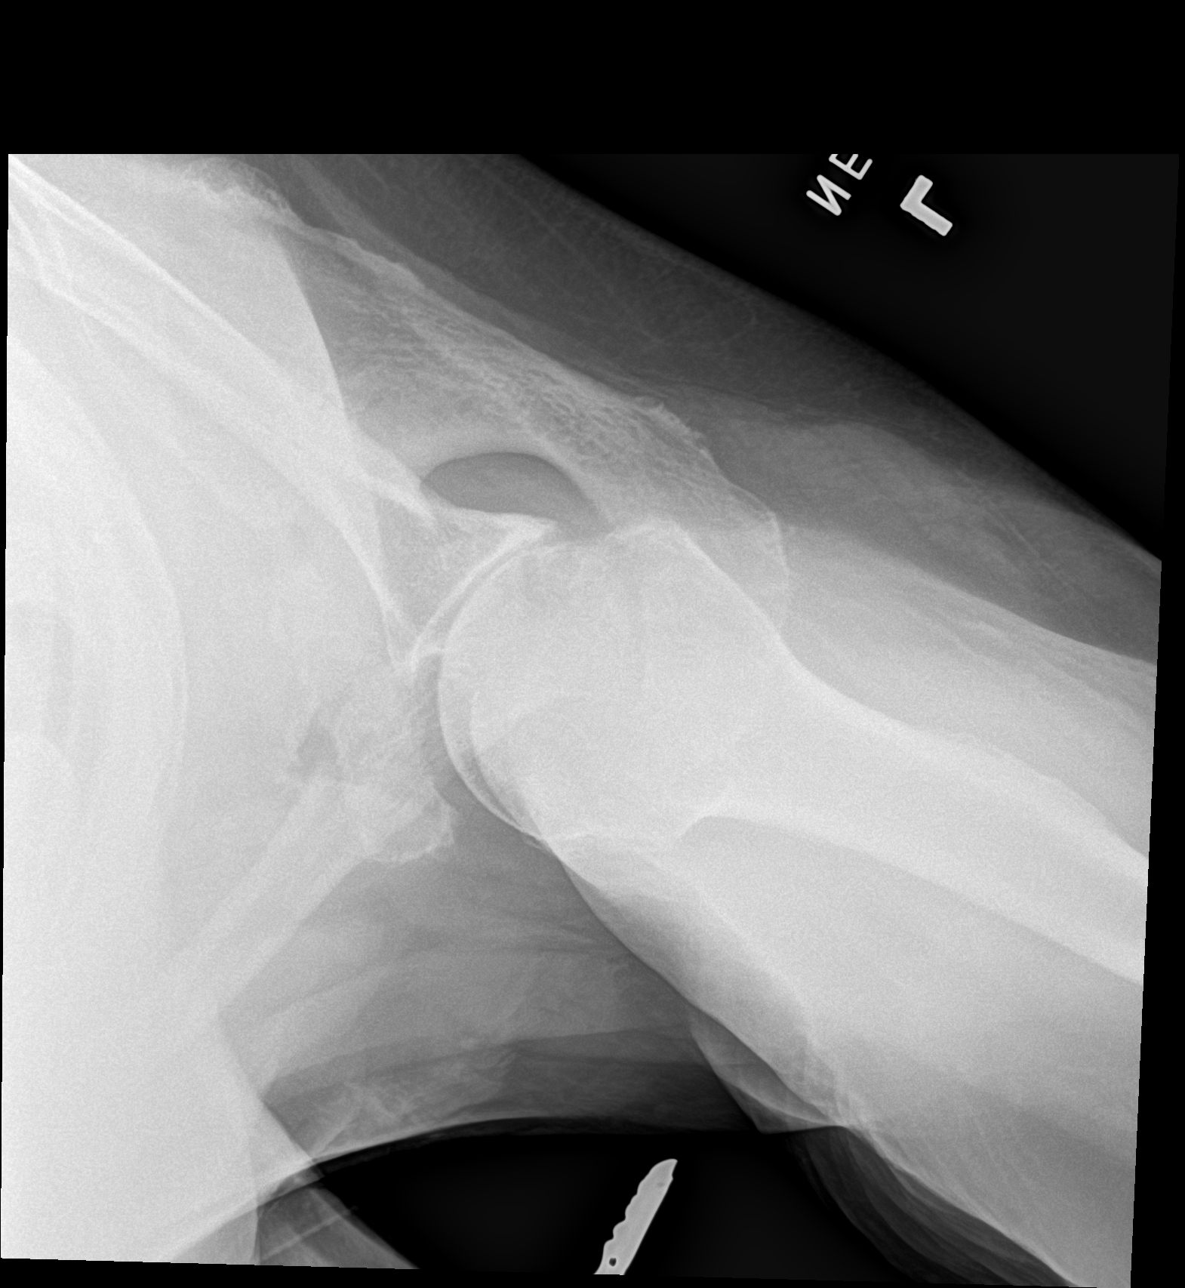

[3 of 3 positions shown; findings below may reference images not displayed]

FINDINGS: No fracture or dislocation is seen.

The joint spaces are preserved.

Visualized soft tissues are within normal limits.

Visualized left lung is clear.
IMPRESSION: Negative.

## 2022-03-02 IMAGING — CT CT CERVICAL SPINE W/O CM
3 of 4 series · 13 of 33 positions shown, 16 images · non-contrast
Comparison: None Available.

CLINICAL DATA: Recent fall with headaches and neck pain, initial
encounter



[Series 5: cor bone · coronal · 0.38mm/px · 3 of 79 slices shown]
[im 16/79  bone]
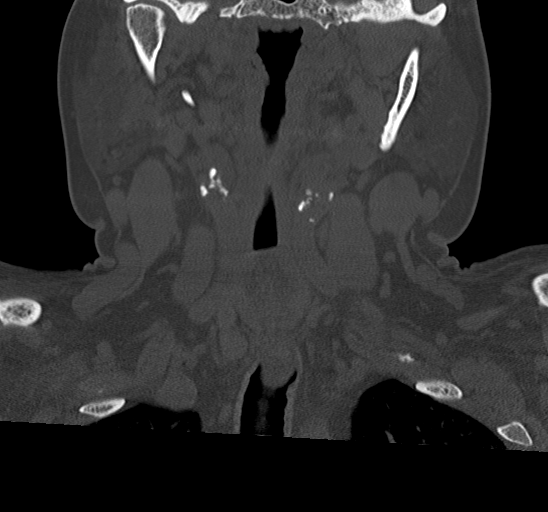
[im 32/79  bone]
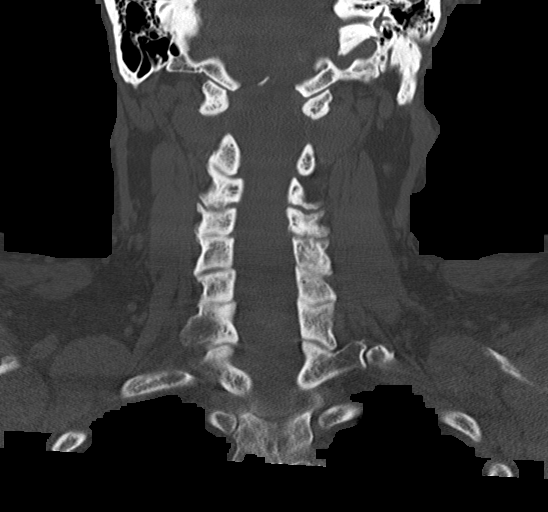
[im 47/79  bone]
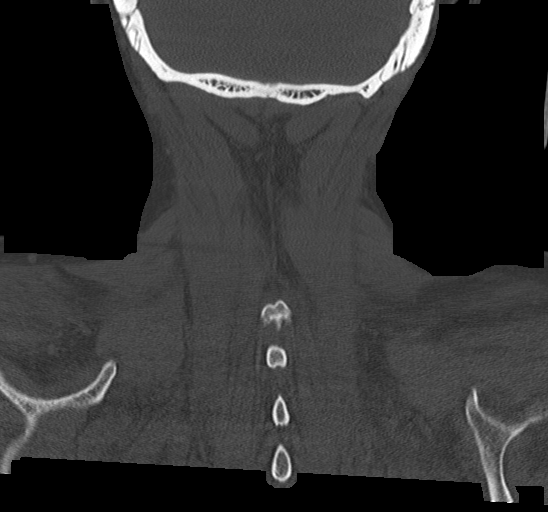

[Series 6: sag bone · sagittal · 0.27mm/px · 5 of 61 slices shown, 6 images]
[im 21/61  bone]
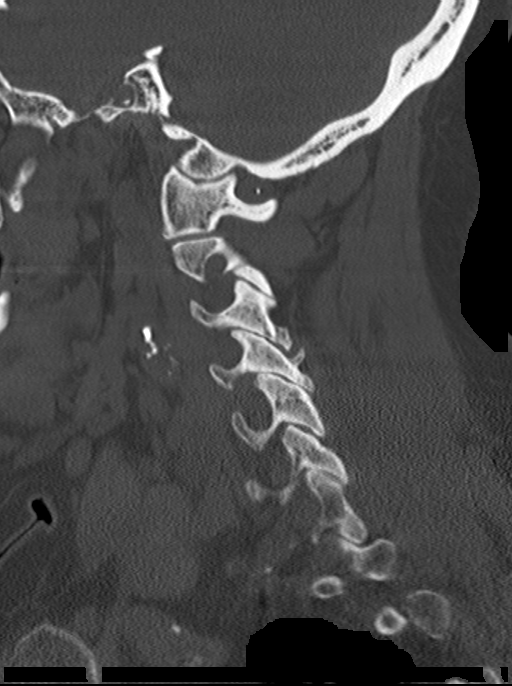
[im 26/61  bone]
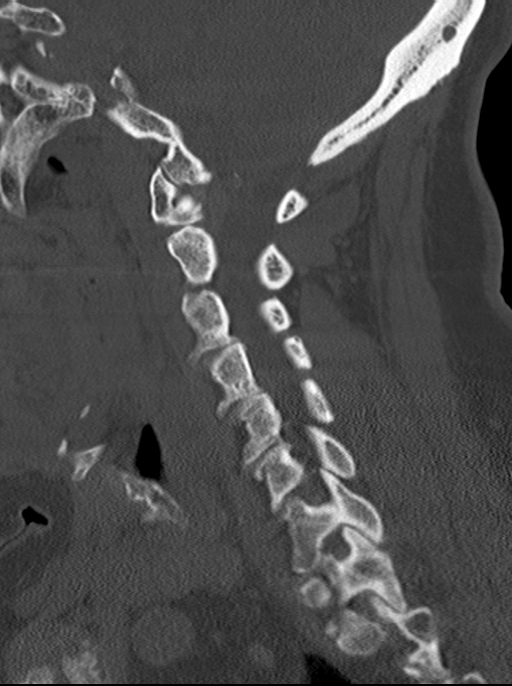
[im 31/61  soft-tissue]
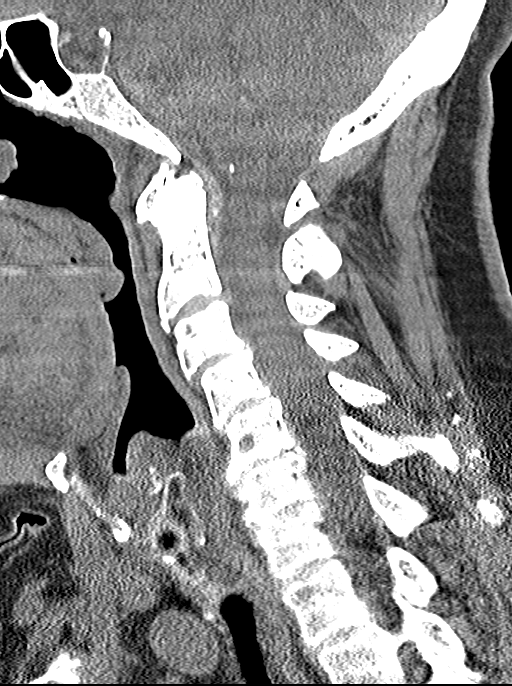
[im 31/61  bone]
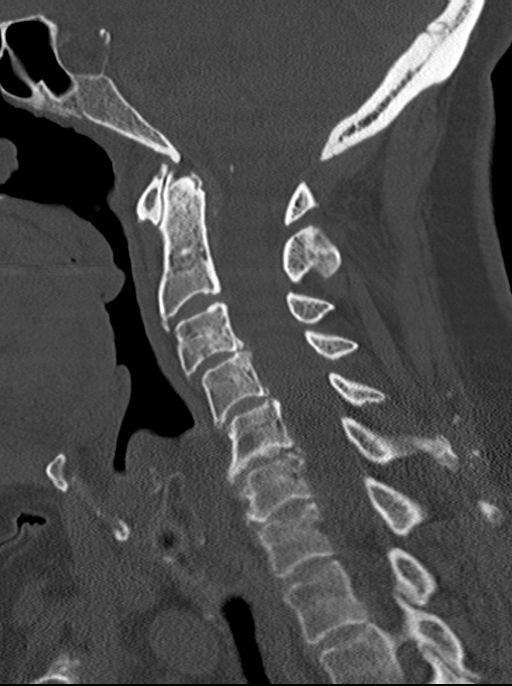
[im 36/61  bone]
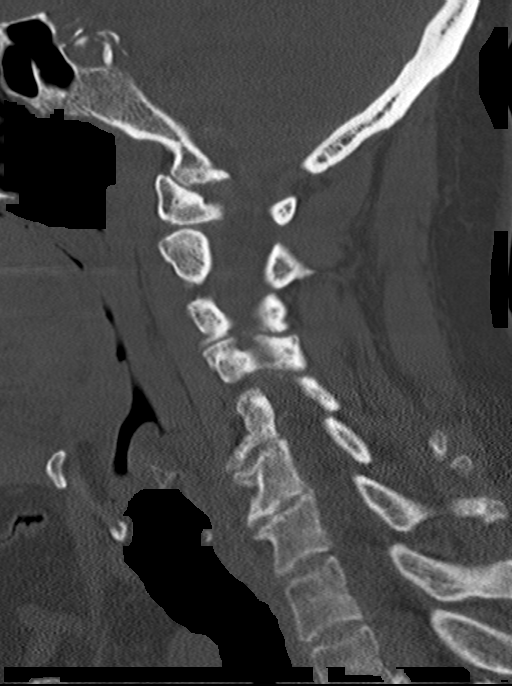
[im 41/61  bone]
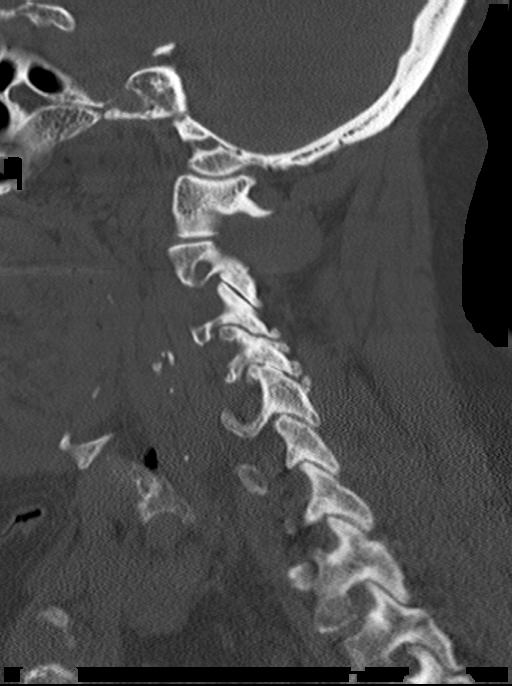

[Series 7: orthogonal axials · axial · 0.21mm/px · z∈[+827,+940]mm · 5 of 84 slices shown, 7 images]
[im 14/84  soft-tissue]
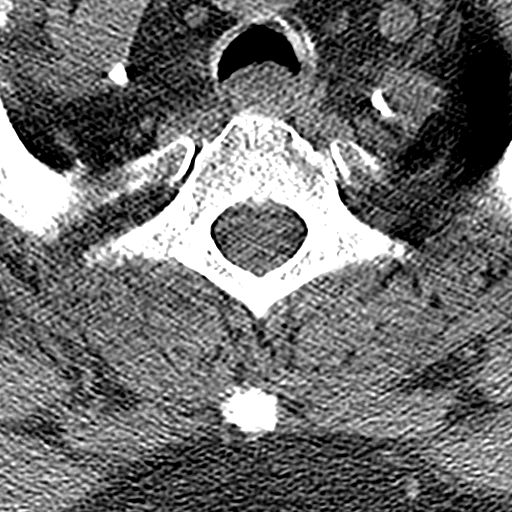
[im 14/84  bone]
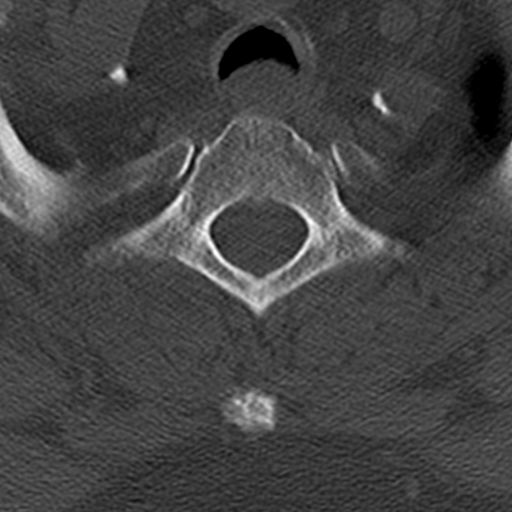
[im 28/84  bone]
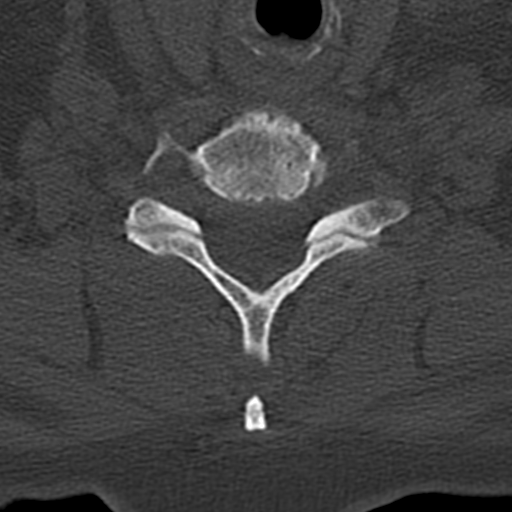
[im 42/84  bone]
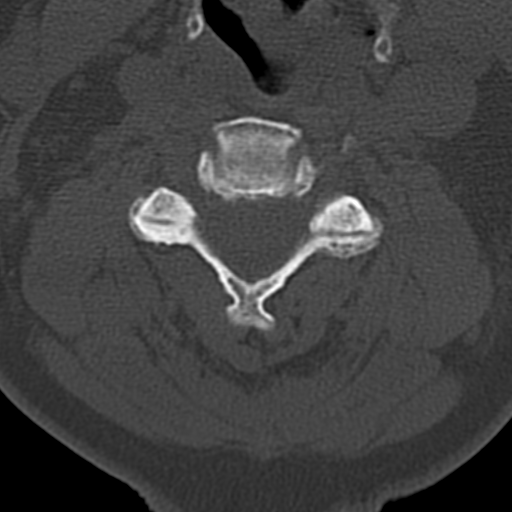
[im 56/84  bone]
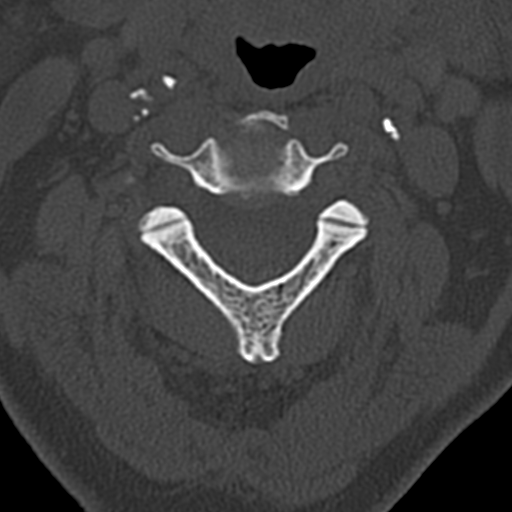
[im 70/84  soft-tissue]
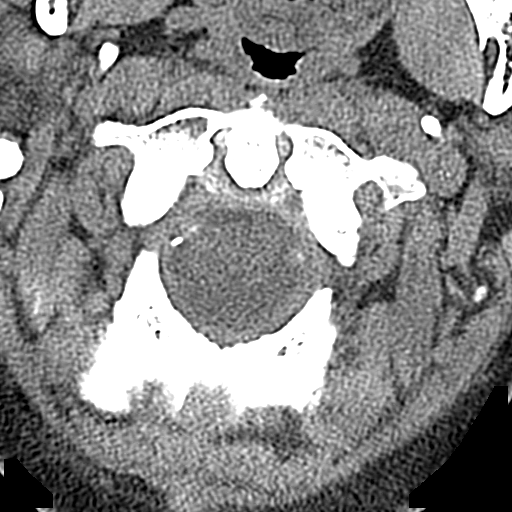
[im 70/84  bone]
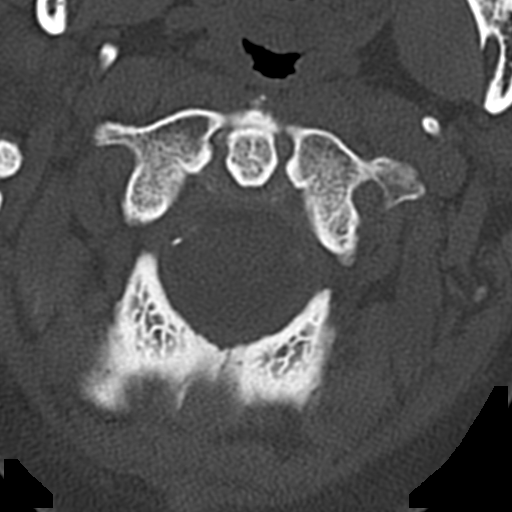

[13 of 33 positions shown; findings below may reference images not displayed]

FINDINGS: CT HEAD FINDINGS

Brain: No evidence of acute infarction, hemorrhage, hydrocephalus,
extra-axial collection or mass lesion/mass effect. Mild chronic
white matter ischemic changes are noted.

Vascular: No hyperdense vessel or unexpected calcification.

Skull: Normal. Negative for fracture or focal lesion.

Sinuses/Orbits: No acute finding.

Other: None.

CT CERVICAL SPINE FINDINGS

Alignment: Normal.

Skull base and vertebrae: 7 cervical segments are well visualized.
Vertebral body height is well maintained. No acute fracture or acute
facet abnormality is noted. Multilevel osteophytic changes and facet
hypertrophic changes are noted. Mild degenerative anterolisthesis of
C3 on C4 and C4 on C5 is noted.

Soft tissues and spinal canal: Surrounding soft tissue structures
show vascular calcifications. No other focal abnormality is noted.

Upper chest: Visualized lung apices are within normal limits.

Other: None
IMPRESSION: CT of the head: No acute intracranial abnormality noted.

Chronic white matter ischemic changes are seen.

CT of the cervical spine: Multilevel degenerative change as
described without acute abnormality.

## 2022-03-02 IMAGING — DX DG SHOULDER 2+V*R*
3 series · 3 of 3 positions shown · non-contrast
Comparison: Right shoulder radiograph dated [DATE].

CLINICAL DATA: Fall and bilateral shoulder pain.

EXAM:
RIGHT SHOULDER - 2+ VIEW

[shoulder grashey]
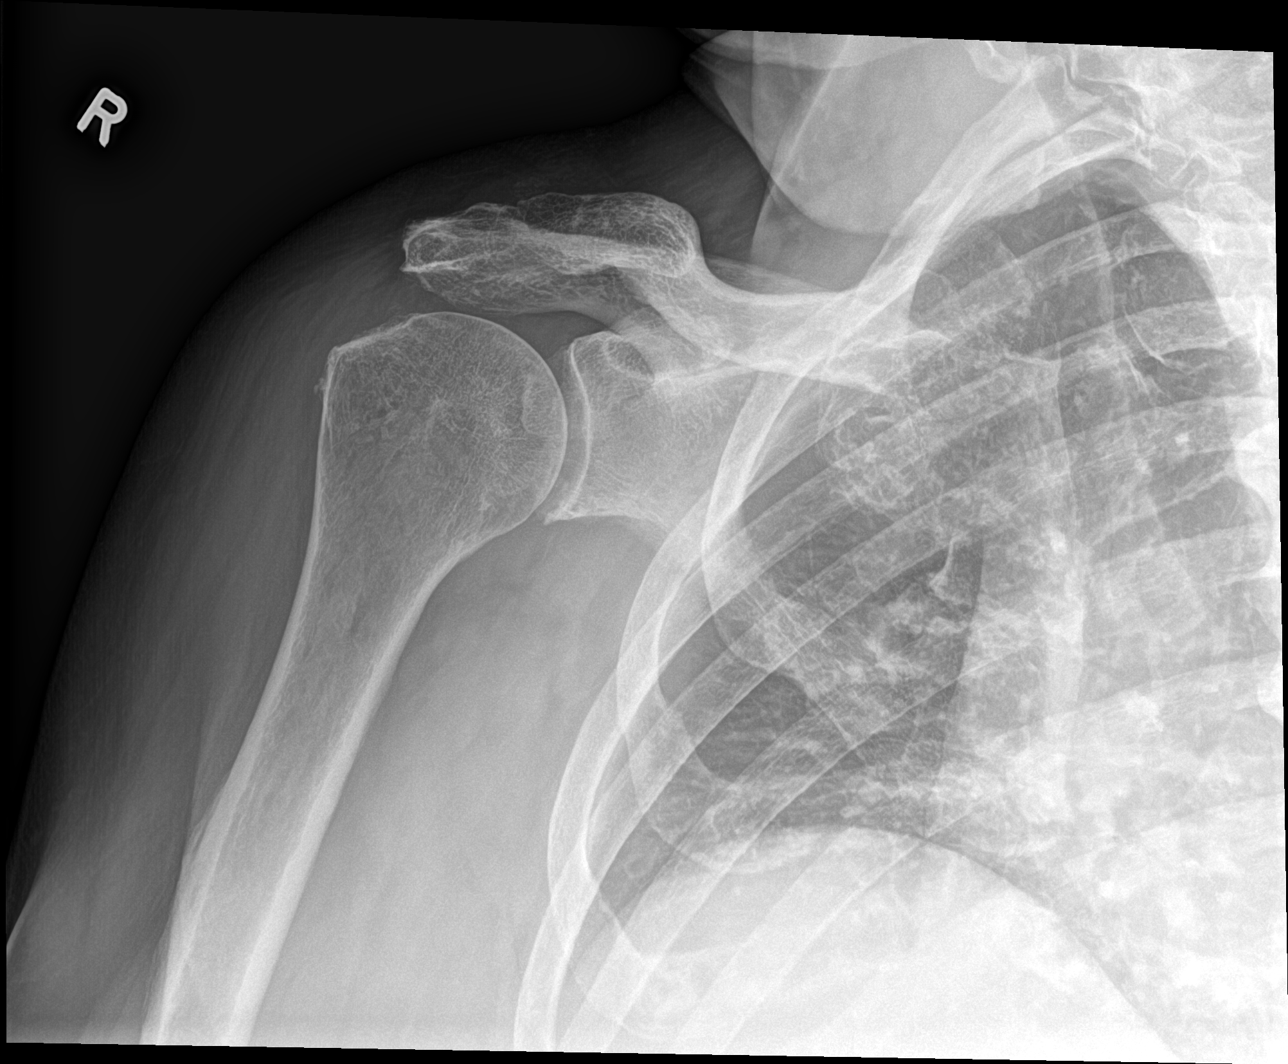

[shoulder y view]
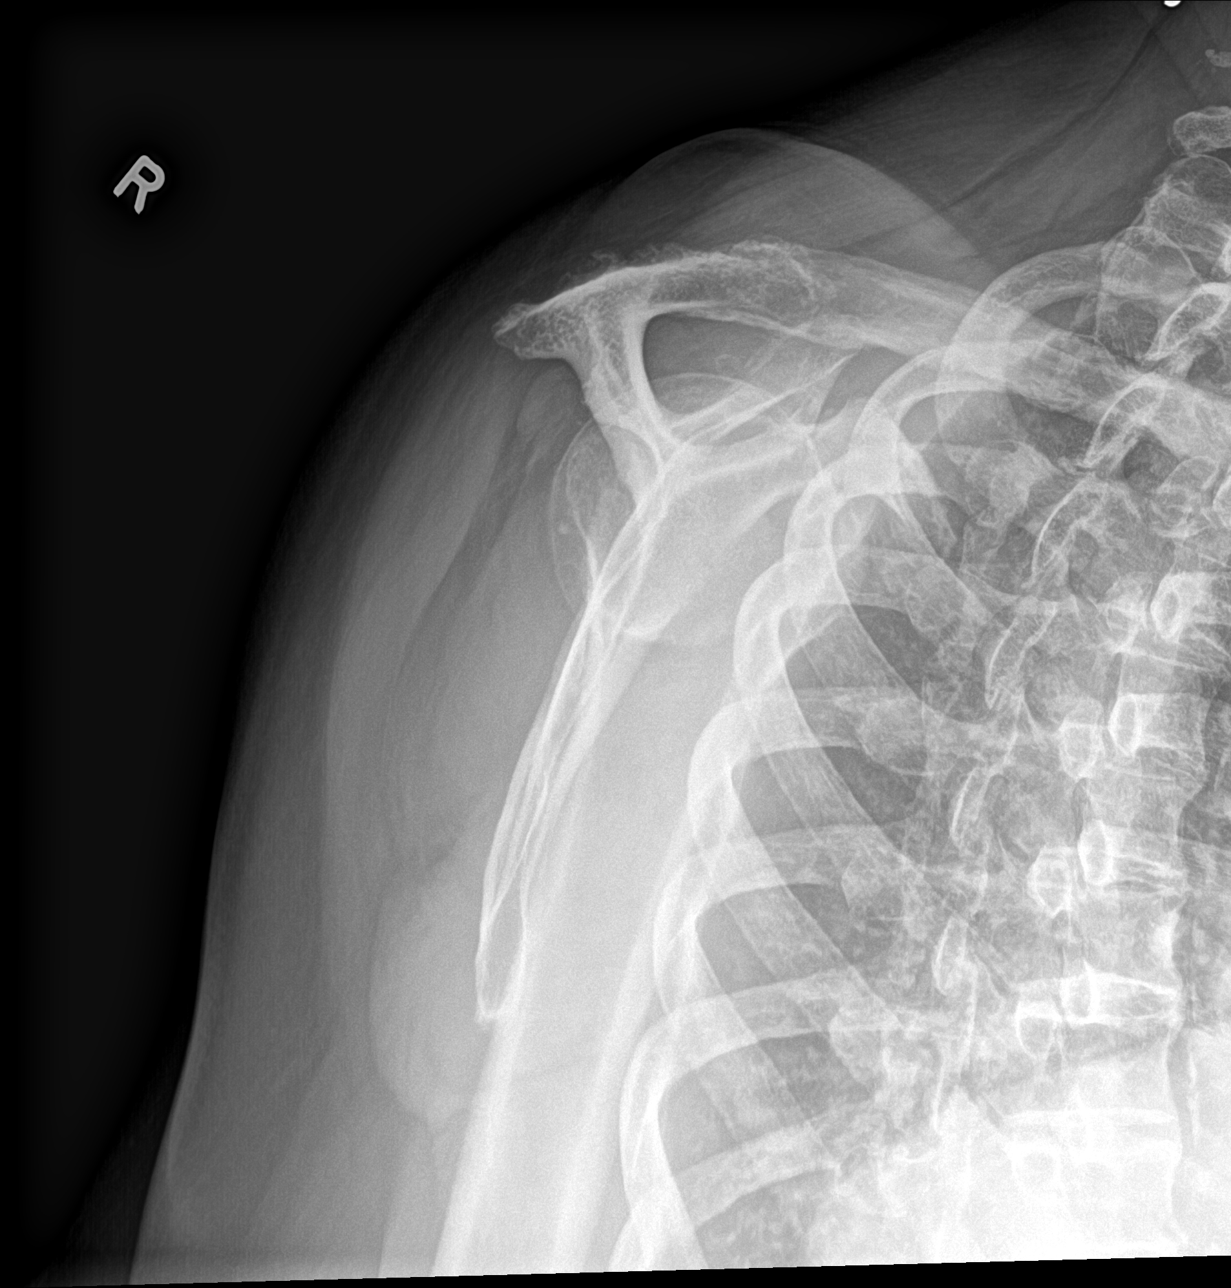

[shoulder axillary]
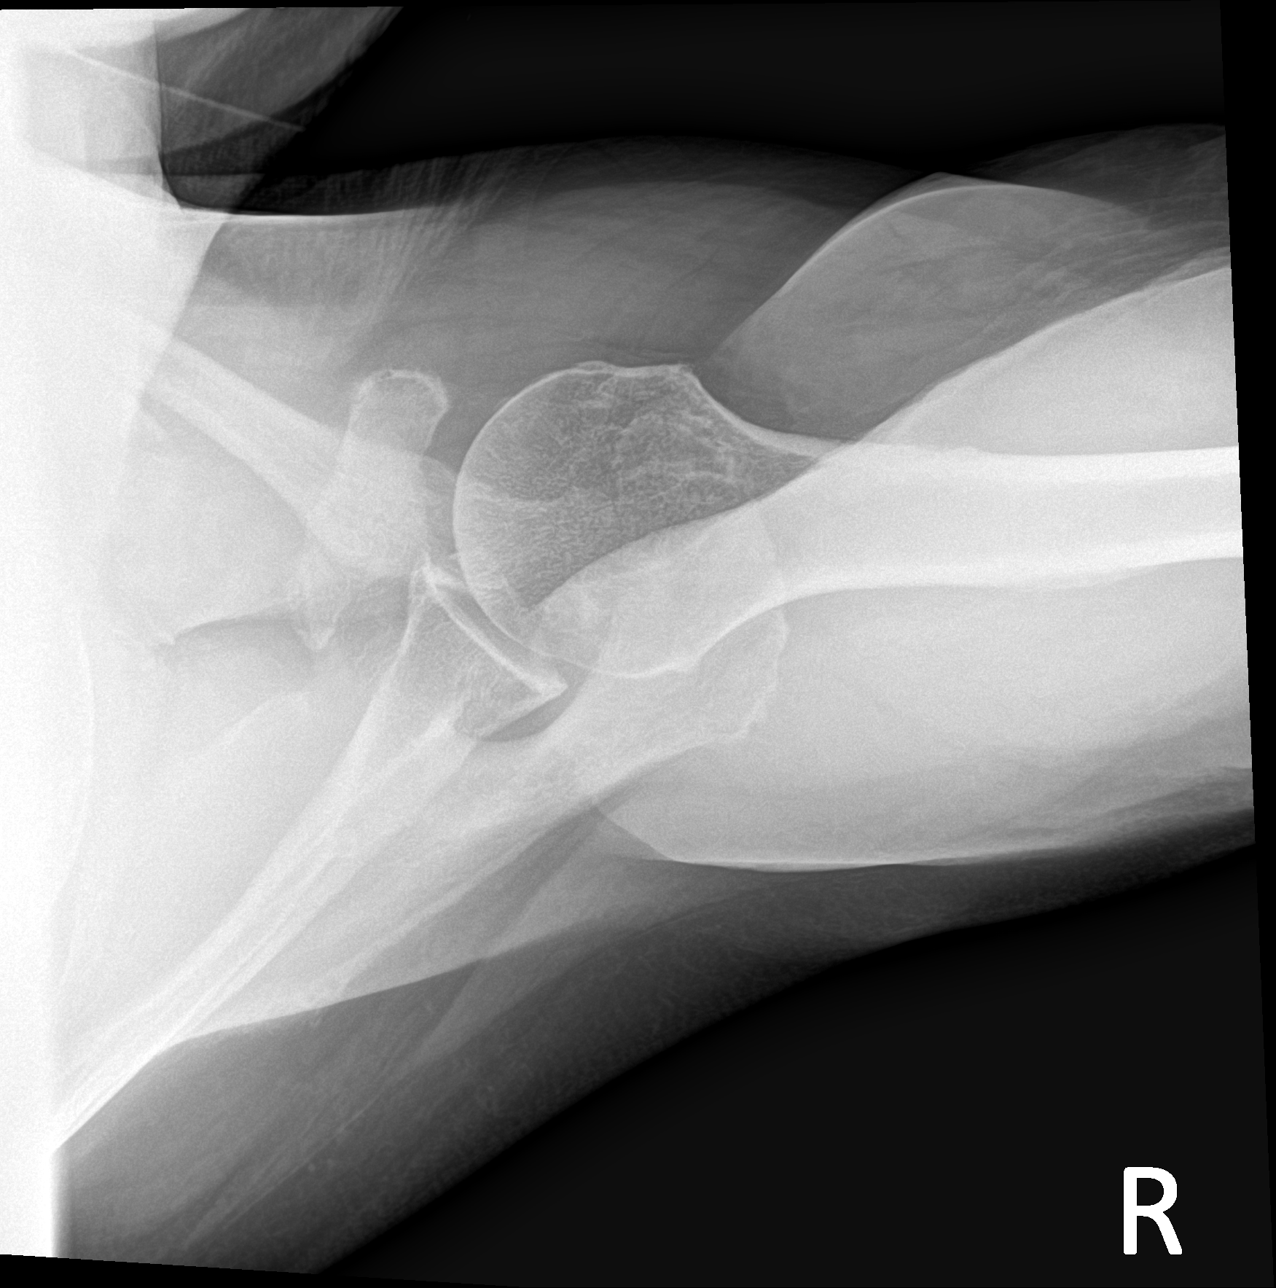

[3 of 3 positions shown; findings below may reference images not displayed]

FINDINGS: There is no acute fracture or dislocation. The bones are osteopenic.
Mild arthritic changes of the right shoulder. The soft tissues are
unremarkable.
IMPRESSION: No acute fracture or dislocation.

## 2022-03-02 IMAGING — CT CT HEAD W/O CM
4 series · 16 of 47 positions shown, 18 images · non-contrast
Comparison: None Available.

CLINICAL DATA: Recent fall with headaches and neck pain, initial
encounter



[Series 2: head wo · axial · 0.42mm/px · z∈[+989,+1109]mm · 7 of 33 slices shown, 9 images]
[im 5/33  brain]
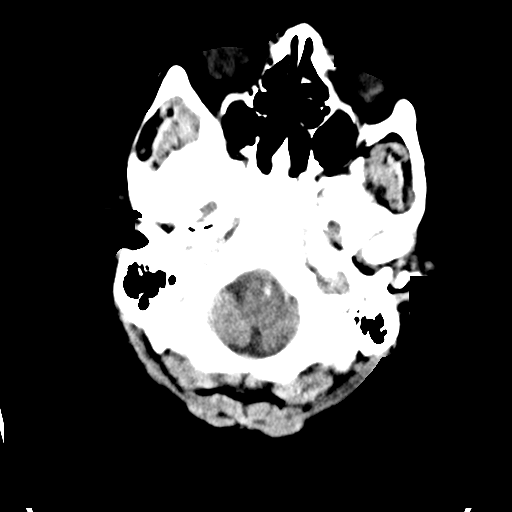
[im 5/33  bone]
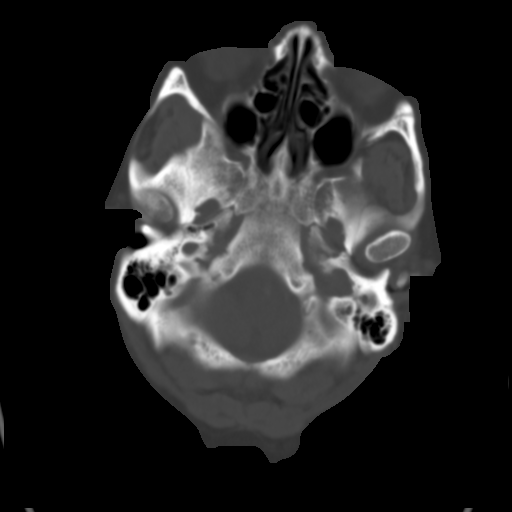
[im 9/33  brain]
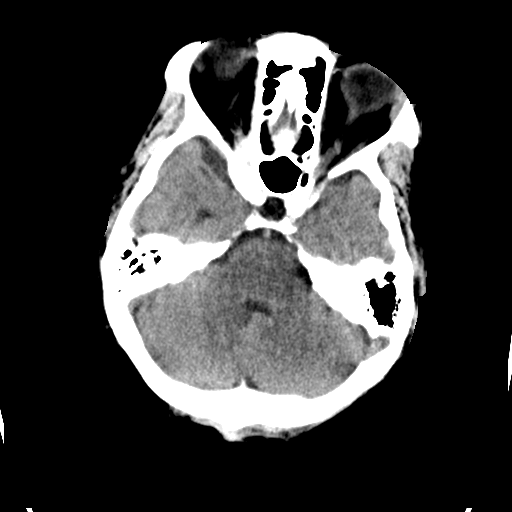
[im 13/33  brain]
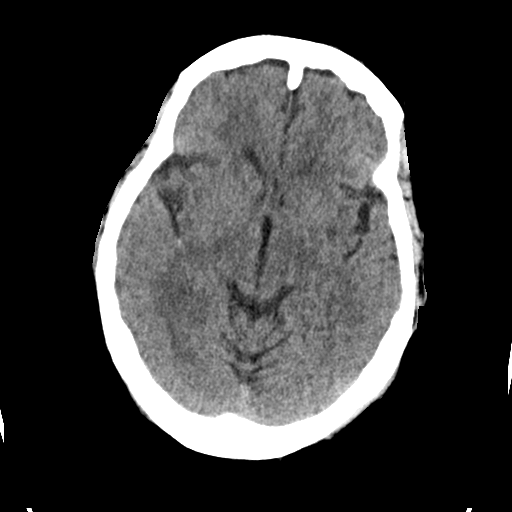
[im 17/33  brain]
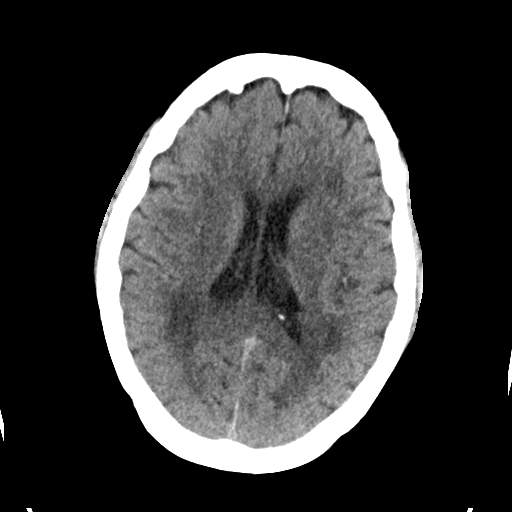
[im 21/33  brain]
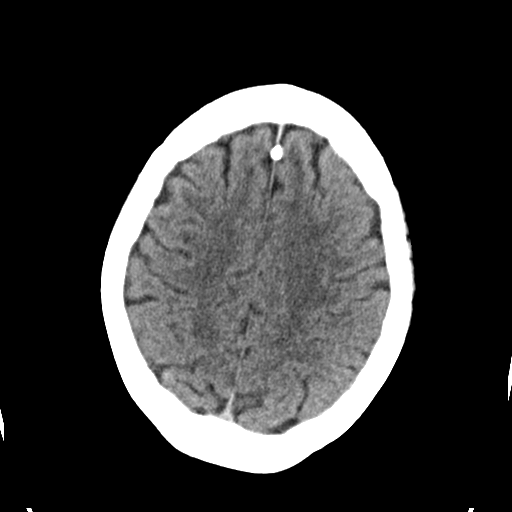
[im 21/33  bone]
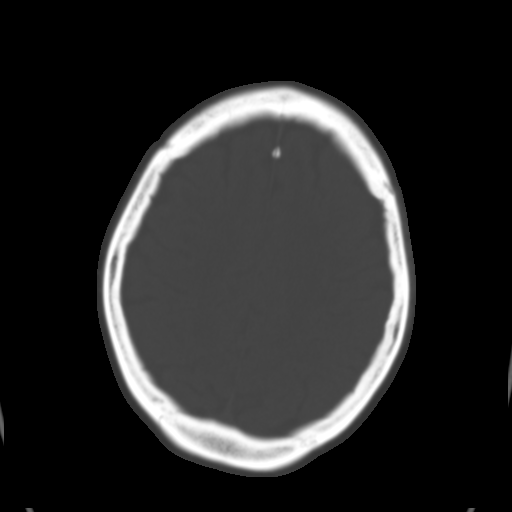
[im 25/33  brain]
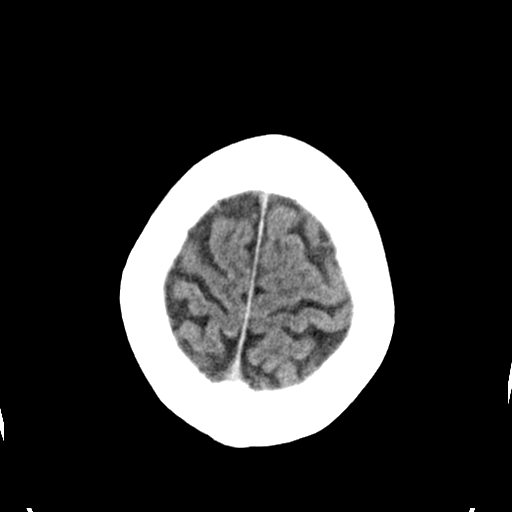
[im 29/33  brain]
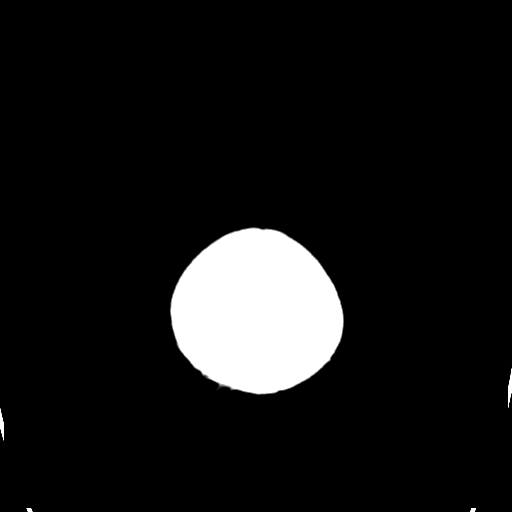

[Series 3: head bone · axial · 0.42mm/px · z∈[+985,+1017]mm · 3 of 82 slices shown]
[im 9/82  bone]
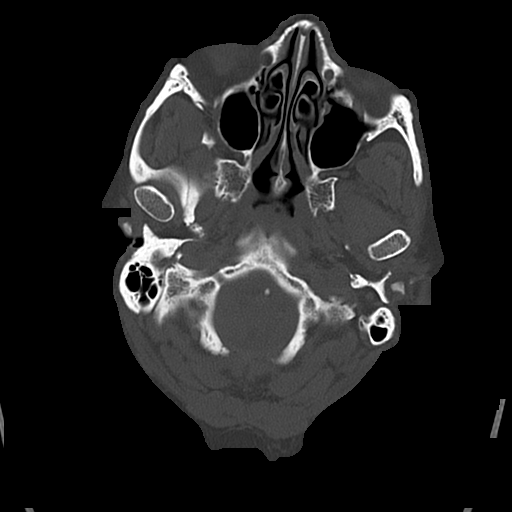
[im 17/82  bone]
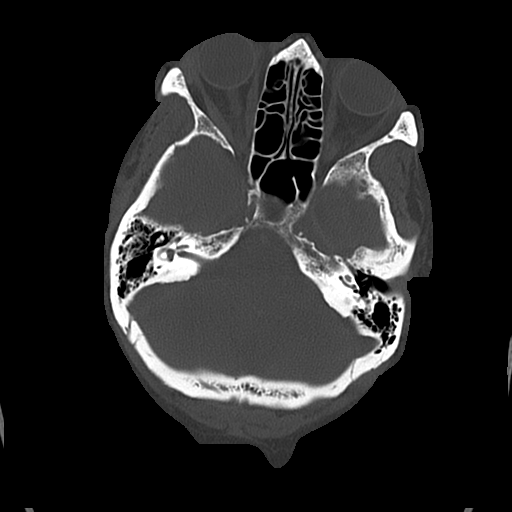
[im 25/82  bone]
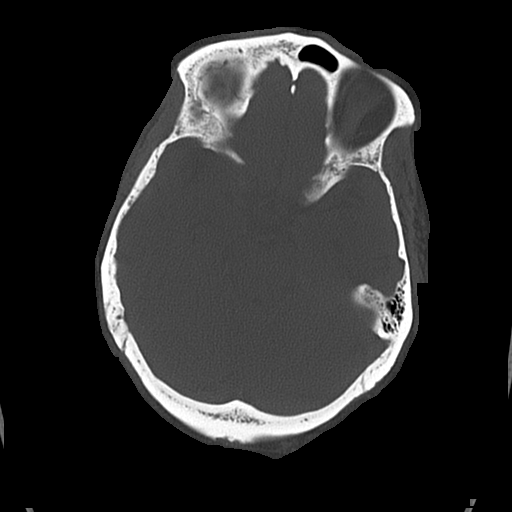

[Series 4: coronal soft · coronal · 0.32mm/px · 3 of 69 slices shown]
[im 23/69  brain]
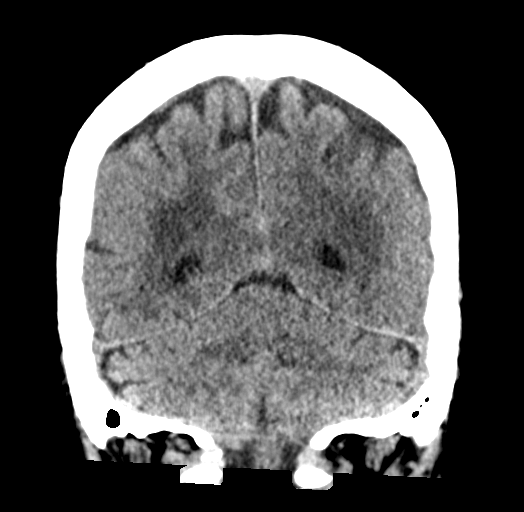
[im 31/69  brain]
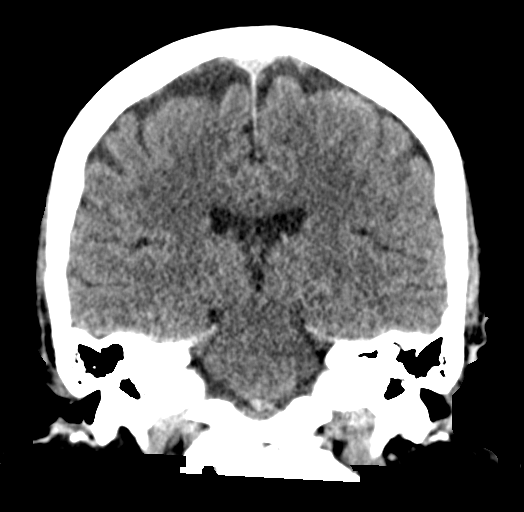
[im 38/69  brain]
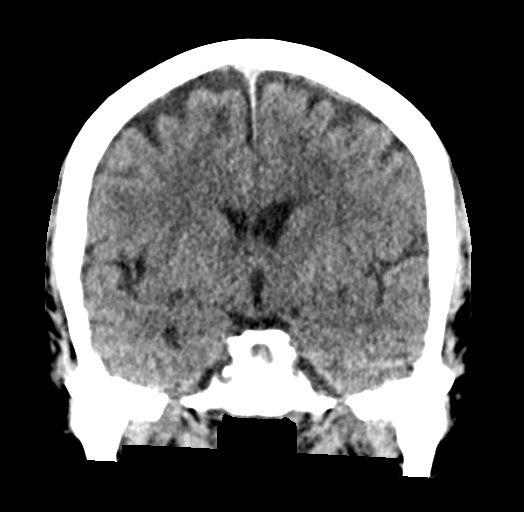

[Series 5: sagittal soft · sagittal · 0.32mm/px · 3 of 57 slices shown]
[im 19/57  brain]
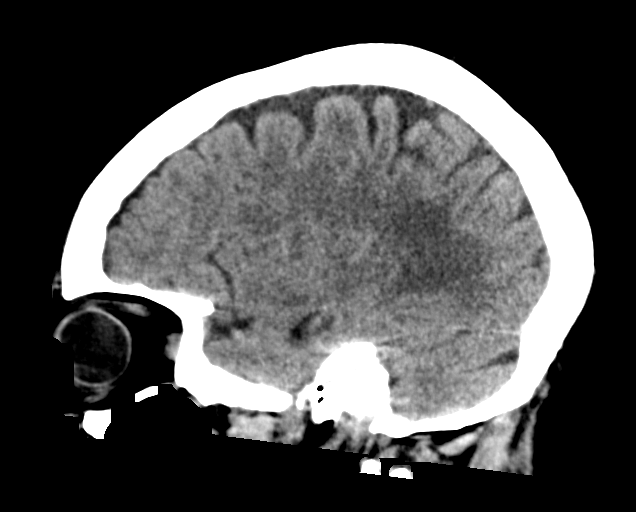
[im 29/57  brain]
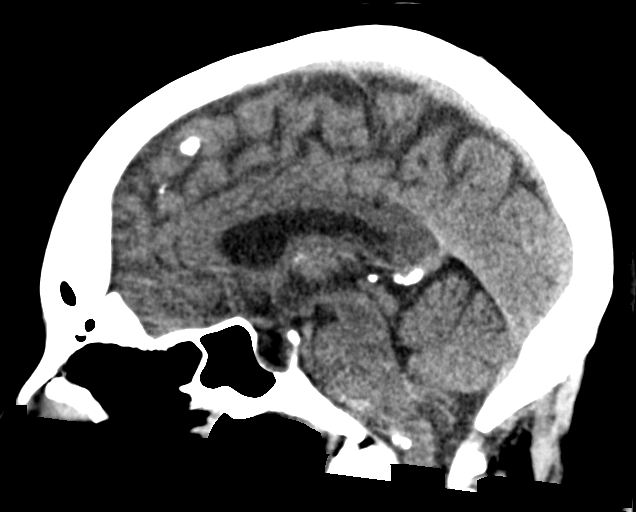
[im 38/57  brain]
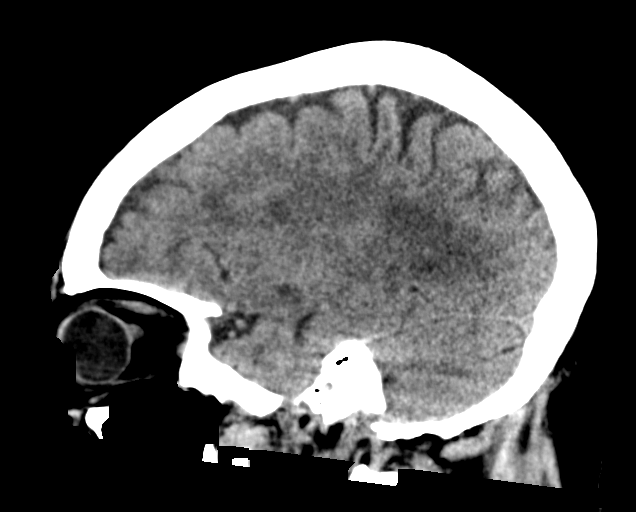

[16 of 47 positions shown; findings below may reference images not displayed]

FINDINGS: CT HEAD FINDINGS

Brain: No evidence of acute infarction, hemorrhage, hydrocephalus,
extra-axial collection or mass lesion/mass effect. Mild chronic
white matter ischemic changes are noted.

Vascular: No hyperdense vessel or unexpected calcification.

Skull: Normal. Negative for fracture or focal lesion.

Sinuses/Orbits: No acute finding.

Other: None.

CT CERVICAL SPINE FINDINGS

Alignment: Normal.

Skull base and vertebrae: 7 cervical segments are well visualized.
Vertebral body height is well maintained. No acute fracture or acute
facet abnormality is noted. Multilevel osteophytic changes and facet
hypertrophic changes are noted. Mild degenerative anterolisthesis of
C3 on C4 and C4 on C5 is noted.

Soft tissues and spinal canal: Surrounding soft tissue structures
show vascular calcifications. No other focal abnormality is noted.

Upper chest: Visualized lung apices are within normal limits.

Other: None
IMPRESSION: CT of the head: No acute intracranial abnormality noted.

Chronic white matter ischemic changes are seen.

CT of the cervical spine: Multilevel degenerative change as
described without acute abnormality.

## 2022-03-02 NOTE — ED Notes (Signed)
Dc instructions reviewed with pt no questions or concerns at this time. Will follow up with pcp. No questions or concerns at this time.  ?

## 2022-03-02 NOTE — Discharge Instructions (Addendum)
No broken bones or abnormalities were noted on your imaging studies. ?Please use acetaminophen and cold packs as needed for pain control ?Return if you are having any worsening symptoms ? ?

## 2022-03-02 NOTE — ED Notes (Signed)
Pt returned from CT °

## 2022-03-02 NOTE — ED Provider Notes (Signed)
?[facility] EMERGENCY DEPT ?Provider Note ? ? ?CSN: [id number] ?Arrival date & time: 03/02/22  1636 ? ?  ? ?History ? ?Chief Complaint  ?Patient presents with  ? Fall  ? ? ?[name] is a 81 y.o. female. ? ?HPI ?81 year old female presents today complaining of pain after fall.  Patient states that she tripped on the curb and fell landing on her right shoulder with pain that then goes into her left shoulder.  She struck her head but does not think that she lost consciousness.  She is not currently on any blood thinners. ? ?  ? ?Home Medications ?Prior to Admission medications   ?Medication Sig Start Date End Date Taking? Authorizing Provider  ?allopurinol (ZYLOPRIM) 300 MG tablet Take 1 tablet by mouth daily 02/02/22   [name], MD  ?aspirin EC 81 MG tablet Take 1 tablet (81 mg total) by mouth daily. 02/02/22   [name], MD  ?atenolol-chlorthalidone (TENORETIC) 50-25 MG tablet Take 1 tablet by mouth daily. 11/07/21   [name], MD  ?benzonatate (TESSALON PERLES) 100 MG capsule Take 1 capsule (100 mg total) by mouth 3 (three) times daily as needed for cough. 03/01/22 03/01/23  [name], FNP  ?Blood Pressure KIT 1 Units by Does not apply route daily. Check blood pressure daily for hypertension dx code I10 09/14/21   Skeet Latch, MD  ?diclofenac Sodium (VOLTAREN) 1 % GEL Apply 4 g topically 4 (four) times daily. Apply to back 11/26/21   Palumbo, April, MD  ?Dulaglutide (TRULICITY) 2.70 JJ/0.0XF SOPN Inject 0.5 mg into the skin every 14 (fourteen) days.    [provider]  ?hydrocortisone cream 1 % Apply to affected area 2 times daily 03/01/22 03/01/23  [name], [place]  ?Lancets (ONETOUCH DELICA PLUS [id number]) [place] USE TO CHECK BLOOD SUGAR TWICE DAILY 12/24/20   [name], MD  ?lidocaine (LIDODERM) 5 % Place 1 patch onto the skin daily. Remove & Discard patch within 12 hours or as directed by MD 11/26/21   Randal Buba, April, MD  ?Magnesium 250 MG TABS Take 1 tablet (250 mg  total) by mouth every evening. Take with dinner meal 08/18/21   [name], MD  ?Multiple Vitamin (MULTIVITAMIN) tablet Take 1 tablet by mouth daily. 08/18/21   [name], MD  ?Multiple Vitamins-Minerals (ZINC PO) Take 1 tablet by mouth as needed.    [provider]  ?Roma Schanz test strip USE TO TEST 2 TIMES DAILY 12/20/20   [name], MD  ?rosuvastatin (CRESTOR) 20 MG tablet TAKE 1 TABLET(20 MG) BY MOUTH DAILY 02/02/22   [name], MD  ?Saccharomyces boulardii (PROBIOTIC) 250 MG CAPS 1 tablet by mouth daily 08/18/21   [name], MD  ?solifenacin (VESICARE) 5 MG tablet Take 5 mg by mouth every morning. 02/09/22   [provider]  ?tiZANidine (ZANAFLEX) 4 MG tablet Take 0.5 tablets (2 mg total) by mouth every 6 (six) hours as needed for muscle spasms. 11/27/21   Luna Fuse, MD  ?valsartan (DIOVAN) 80 MG tablet Take 1 tablet (80 mg total) by mouth daily. 09/14/21   Skeet Latch, MD  ?vitamin B-12 (CYANOCOBALAMIN) 100 MCG tablet Take 1 tablet by mouth daily 08/18/21   [name], MD  ?Vitamin D, Ergocalciferol, (DRISDOL) 1.25 MG (50000 UNIT) CAPS capsule TAKE 1 CAPSULE BY MOUTH TWICE WEEKLY ON TUESDAYS AND FRIDAYS 12/07/21   [name], MD  ?   ? ?Allergies    ?Atorvastatin, Atorvastatin calcium, Bee venom, Hydrocodone-acetaminophen, Oxycodone, and Vicodin [hydrocodone-acetaminophen]   ? ?  Review of Systems   ?Review of Systems  ?All other systems reviewed and are negative. ? ?Physical Exam ?Updated Vital Signs ?BP (!) 167/73   Pulse (!) 52   Temp 98.7 ?F (37.1 ?C)   Resp 17   SpO2 100%  ?Physical Exam ?Vitals and nursing note reviewed.  ?Constitutional:   ?   General: She is not in acute distress. ?   Appearance: Normal appearance. She is obese.  ?HENT:  ?   Head: Normocephalic.  ?   Right Ear: External ear normal.  ?   Left Ear: External ear normal.  ?   Nose: Nose normal.  ?   Mouth/Throat:  ?   Mouth: Mucous membranes are moist.  ?Eyes:  ?   Pupils: Pupils  are equal, round, and reactive to light.  ?Neck:  ?   Comments: Patient with mild diffuse tenderness palpation posterior cervical spine ?Cardiovascular:  ?   Rate and Rhythm: Normal rate.  ?   Pulses: Normal pulses.  ?Pulmonary:  ?   Effort: Pulmonary effort is normal.  ?Abdominal:  ?   General: Abdomen is flat.  ?   Palpations: Abdomen is soft.  ?Musculoskeletal:  ?   Comments: Full active range of motion bilateral shoulders with tenderness palpation bilateral shoulders ?Some tenderness to palpation over sternum ?Full active range of motion of bilateral hips with abrasion to right knee with some mild tenderness  ?Skin: ?   General: Skin is warm and dry.  ?   Capillary Refill: Capillary refill takes less than 2 seconds.  ?Neurological:  ?   General: No focal deficit present.  ?   Mental Status: She is alert.  ?   Cranial Nerves: No cranial nerve deficit.  ?   Motor: No weakness.  ?Psychiatric:     ?   Mood and Affect: Mood normal.     ?   Behavior: Behavior normal.  ? ? ?ED Results / Procedures / Treatments   ?Labs ?(all labs ordered are listed, but only abnormal results are displayed) ?Labs Reviewed - No data to display ? ?EKG ?None ? ?Radiology ?DG Chest 2 View ? ?Result Date: 03/02/2022 ?CLINICAL DATA:  Chest pain and fall. EXAM: CHEST - 2 VIEW COMPARISON:  Chest radiograph dated 05/06/2020. 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 No focal consolidation, pleural effusion or pneumothorax. Stable mild cardiomegaly. Atherosclerotic calcification of the aortic arch. No acute osseous pathology. Degenerative changes of the IMPRESSION: No acute cardiopulmonary process. Electronically Signed   By: Anner Crete M.D.   On: 03/02/2022 17:53  ? ?DG Shoulder Right ? ?Result Date: 03/02/2022 ?CLINICAL DATA:  Fall and bilateral shoulder pain. EXAM: RIGHT SHOULDER - 2+ VIEW COMPARISON:  Right shoulder radiograph dated 05/25/2012. FINDINGS: There is no acute fracture or dislocation. The bones are osteopenic.  Mild arthritic changes of the right shoulder. The soft tissues are unremarkable. IMPRESSION: No acute fracture or dislocation. Electronically Signed   By: Anner Crete M.D.   On: 03/02/2022 17:54  ? ?CT Head Wo Contrast ? ?Result Date: 03/02/2022 ?CLINICAL DATA:  Recent fall with headaches and neck pain, initial encounter EXAM: CT HEAD WITHOUT CONTRAST CT CERVICAL SPINE WITHOUT CONTRAST TECHNIQUE: Multidetector CT imaging of the head and cervical spine was performed following the standard protocol without intravenous contrast. Multiplanar CT image reconstructions of the cervical spine were also generated. RADIATION DOSE REDUCTION: This exam was performed according to the departmental dose-optimization program which includes automated exposure control, adjustment of the mA and/or  kV according to patient size and/or use of iterative reconstruction technique. COMPARISON:  None Available. FINDINGS: CT HEAD FINDINGS Brain: No evidence of acute infarction, hemorrhage, hydrocephalus, extra-axial collection or mass lesion/mass effect. Mild chronic white matter ischemic changes are noted. Vascular: No hyperdense vessel or unexpected calcification. Skull: Normal. Negative for fracture or focal lesion. Sinuses/Orbits: No acute finding. Other: None. CT CERVICAL SPINE FINDINGS Alignment: Normal. Skull base and vertebrae: 7 cervical segments are well visualized. Vertebral body height is well maintained. No acute fracture or acute facet abnormality is noted. Multilevel osteophytic changes and facet hypertrophic changes are noted. Mild degenerative anterolisthesis of C3 on C4 and C4 on C5 is noted. Soft tissues and spinal canal: Surrounding soft tissue structures show vascular calcifications. No other focal abnormality is noted. Upper chest: Visualized lung apices are within normal limits. Other: None IMPRESSION: CT of the head: No acute intracranial abnormality noted. Chronic white matter ischemic changes are seen. CT of the  cervical spine: Multilevel degenerative change as described without acute abnormality. Electronically Signed   By: Inez Catalina M.D.   On: 03/02/2022 22:01  ? ?CT Cervical Spine Wo Contrast ? ?Result Date: 5/4/2

## 2022-03-02 NOTE — ED Triage Notes (Signed)
Pt arrives to ED with c/o fall. Pt reports she tripped in a parking lot and fell today. She reports pain to her right knee, bilateral shoulder, and chest. She reports she landed on her right knee first, caught herself with her right hand, the her chest hit a curb. She denies syncope, LOC, and injury to head or neck.  ?

## 2022-03-03 ENCOUNTER — Telehealth: Payer: Self-pay

## 2022-03-03 NOTE — Chronic Care Management (AMB) (Addendum)
? ? ?Chronic Care Management ?Pharmacy Assistant  ? ?Name: [name]  MRN: [id number] DOB: July 23, 1941 ? ? ?Reason for Encounter: Medication Review/ Medication coordination ? ?Recent office visits:  ?03-01-2022 [name], [place]. Glucose= 152, Creatinine= 1.08, eGFR= 52. A1C= 6.9. Trig= 162, HDL= 39, LDL= 103. STOP keflex. START hydrocortisone cream Apply to affected area 2 times daily. ? ?Recent consult visits:  ?02-20-2022 [name], DPM (Podiatry). Toenails 1-5 b/l were debrided in length and girth with sterile nail nippers and dremel without iatrogenic bleeding. Follow up in 3 months. ? ?02-08-2022 Cheryl Fells, MD (Urology). START vesicare 5 mg daily before breakfast. ? ?Hospital visits:  ?Medication Reconciliation was completed by comparing discharge summary, patient?s EMR and Pharmacy list, and upon discussion with patient. ? ?Admitted to the hospital on 03-02-2022 due to Fall. Discharge date was 03-02-2022. Discharged from [name] emergency department. ? ?New?Medications Started at [facility] Discharge:?? ?None ? ?Medication Changes at Hospital Discharge: ?None ? ?Medications Discontinued at Hospital Discharge: ?None ? ?Medications that remain the same after Hospital Discharge:??  ?-All other medications will remain the same.   ? ?Medications: ?Outpatient Encounter Medications as of 03/03/2022  ?Medication Sig  ? allopurinol (ZYLOPRIM) 300 MG tablet Take 1 tablet by mouth daily  ? aspirin EC 81 MG tablet Take 1 tablet (81 mg total) by mouth daily.  ? atenolol-chlorthalidone (TENORETIC) 50-25 MG tablet Take 1 tablet by mouth daily.  ? benzonatate (TESSALON PERLES) 100 MG capsule Take 1 capsule (100 mg total) by mouth 3 (three) times daily as needed for cough.  ? Blood Pressure KIT 1 Units by Does not apply route daily. Check blood pressure daily for hypertension dx code I10  ? diclofenac Sodium (VOLTAREN) 1 % GEL Apply 4 g topically 4 (four) times daily. Apply to back  ?  Dulaglutide (TRULICITY) 8.52 DP/8.2UM SOPN Inject 0.5 mg into the skin every 14 (fourteen) days.  ? hydrocortisone cream 1 % Apply to affected area 2 times daily  ? Lancets (ONETOUCH DELICA PLUS [id number]) MISC USE TO CHECK BLOOD SUGAR TWICE DAILY  ? lidocaine (LIDODERM) 5 % Place 1 patch onto the skin daily. Remove & Discard patch within 12 hours or as directed by MD  ? Magnesium 250 MG TABS Take 1 tablet (250 mg total) by mouth every evening. Take with dinner meal  ? Multiple Vitamin (MULTIVITAMIN) tablet Take 1 tablet by mouth daily.  ? Multiple Vitamins-Minerals (ZINC PO) Take 1 tablet by mouth as needed.  ? ONETOUCH VERIO test strip USE TO TEST 2 TIMES DAILY  ? rosuvastatin (CRESTOR) 20 MG tablet TAKE 1 TABLET(20 MG) BY MOUTH DAILY  ? Saccharomyces boulardii (PROBIOTIC) 250 MG CAPS 1 tablet by mouth daily  ? solifenacin (VESICARE) 5 MG tablet Take 5 mg by mouth every morning.  ? tiZANidine (ZANAFLEX) 4 MG tablet Take 0.5 tablets (2 mg total) by mouth every 6 (six) hours as needed for muscle spasms.  ? valsartan (DIOVAN) 80 MG tablet Take 1 tablet (80 mg total) by mouth daily.  ? vitamin B-12 (CYANOCOBALAMIN) 100 MCG tablet Take 1 tablet by mouth daily  ? Vitamin D, Ergocalciferol, (DRISDOL) 1.25 MG (50000 UNIT) CAPS capsule TAKE 1 CAPSULE BY MOUTH TWICE WEEKLY ON TUESDAYS AND FRIDAYS  ? ?No facility-administered encounter medications on file as of 03/03/2022.  ?Reviewed chart for medication changes ahead of medication coordination call. ? ?BP Readings from Last 3 Encounters:  ?03/02/22 (!) 167/73  ?03/01/22 126/66  ?01/07/22 129/80  ?  ?Lab Results  ?  Component Value Date  ? HGBA1C 6.9 (H) 03/01/2022  ?  ? ?Patient obtains medications through Adherence Packaging  30 Days  ? ?Last adherence delivery included:  ?Valsartan 80 mg before breakfast ?Rosuvastatin 20 mg daily before breakfast ?Allopurinol 300 mg daily before breakfast ?Asa 81 mg daily before breakfast ?Atenolol-Chlorthalidone 50/25 mg daily before  breakfast ?Vitamin D 1.25 mg- 1 tablet twice a week on Tuesdays and Fridays before breakfast ? ?Patient declined (meds) last month: ?None ? ?Patient is due for next adherence delivery on: 03-15-2022 ? ?Called patient and reviewed medications and coordinated delivery. ? ?This delivery to include: ?Valsartan 80 mg before breakfast ?Rosuvastatin 20 mg daily before breakfast ?Allopurinol 300 mg daily before breakfast ?Asa 81 mg daily before breakfast ?Atenolol-Chlorthalidone 50/25 mg daily before breakfast ?Vitamin D2 1.25 mg- 1 tablet twice a week on Tuesdays and Fridays before breakfast ?Solifenacin 5 mg before breakfast ? ?No acute/short fill needed ? ?Patient declined the following medications: ?None ? ?Patient needs refills for: Request sent ?Vitamin D2 ? ?Confirmed delivery date of 03-15-2022 advised patient that pharmacy will contact them the morning of delivery. ? ?NOTES: ?Patient stated she fell yesterday and has some scrapes and bruises. Patient reports feeling a little better. ? ?Care Gaps: ?Shingrix overdue ?PNA Vac overdue ?AWV 06-22-2022 ?Yearly ophthalmology overdue ? ?Star Rating Drugs: ?Rosuvastatin 20 mg- Last filled 02-08-2022 [facility] ?Valsartan 80 mg- Last filled 02-08-2022 [facility] ?Trulicity 0.5 mg- Patient assistance ? ?Malecca Hicks CMA ?Clinical Pharmacist Assistant ?[phone] ? ?

## 2022-03-06 ENCOUNTER — Ambulatory Visit: Payer: Medicare Other | Admitting: Internal Medicine

## 2022-03-08 ENCOUNTER — Ambulatory Visit (INDEPENDENT_AMBULATORY_CARE_PROVIDER_SITE_OTHER): Payer: Medicare Other | Admitting: Internal Medicine

## 2022-03-08 ENCOUNTER — Ambulatory Visit (INDEPENDENT_AMBULATORY_CARE_PROVIDER_SITE_OTHER): Payer: Medicare Other

## 2022-03-08 ENCOUNTER — Other Ambulatory Visit: Payer: Self-pay | Admitting: Internal Medicine

## 2022-03-08 ENCOUNTER — Encounter: Payer: Self-pay | Admitting: Internal Medicine

## 2022-03-08 ENCOUNTER — Telehealth: Payer: Medicare Other

## 2022-03-08 VITALS — BP 132/80 | HR 63 | Temp 97.7°F | Ht 66.0 in | Wt 238.2 lb

## 2022-03-08 DIAGNOSIS — N1831 Chronic kidney disease, stage 3a: Secondary | ICD-10-CM

## 2022-03-08 DIAGNOSIS — J069 Acute upper respiratory infection, unspecified: Secondary | ICD-10-CM

## 2022-03-08 DIAGNOSIS — R051 Acute cough: Secondary | ICD-10-CM

## 2022-03-08 DIAGNOSIS — E559 Vitamin D deficiency, unspecified: Secondary | ICD-10-CM

## 2022-03-08 DIAGNOSIS — I700 Atherosclerosis of aorta: Secondary | ICD-10-CM | POA: Diagnosis not present

## 2022-03-08 DIAGNOSIS — R351 Nocturia: Secondary | ICD-10-CM

## 2022-03-08 DIAGNOSIS — Z6838 Body mass index (BMI) 38.0-38.9, adult: Secondary | ICD-10-CM

## 2022-03-08 DIAGNOSIS — R682 Dry mouth, unspecified: Secondary | ICD-10-CM

## 2022-03-08 DIAGNOSIS — I129 Hypertensive chronic kidney disease with stage 1 through stage 4 chronic kidney disease, or unspecified chronic kidney disease: Secondary | ICD-10-CM

## 2022-03-08 DIAGNOSIS — N183 Chronic kidney disease, stage 3 unspecified: Secondary | ICD-10-CM

## 2022-03-08 MED ORDER — TRIAMCINOLONE ACETONIDE 40 MG/ML IJ SUSP
40.0000 mg | Freq: Once | INTRAMUSCULAR | Status: AC
  Administered 2022-03-08: 40 mg via INTRAMUSCULAR

## 2022-03-08 MED ORDER — CEFTRIAXONE SODIUM 500 MG IJ SOLR
500.0000 mg | Freq: Once | INTRAMUSCULAR | Status: AC
  Administered 2022-03-08: 500 mg via INTRAMUSCULAR

## 2022-03-08 MED ORDER — AMOXICILLIN 500 MG PO CAPS: 500.0000 mg | ORAL_CAPSULE | Freq: Three times a day (TID) | ORAL | 0 refills | Status: DC

## 2022-03-08 NOTE — Patient Instructions (Addendum)
Biotene-for dry mouth ?Call Dr. Baird Cancer w/ update on Monday ? ?Acute Bronchitis, Adult ? ?Acute bronchitis is sudden inflammation of the main airways (bronchi) that come off the windpipe (trachea) in the lungs. The swelling causes the airways to get smaller and make more mucus than normal. This can make it hard to breathe and can cause coughing or noisy breathing (wheezing). ?Acute bronchitis may last several weeks. The cough may last longer. Allergies, asthma, and exposure to smoke may make the condition worse. ?What are the causes? ?This condition can be caused by germs and by substances that irritate the lungs, including: ?Cold and flu viruses. The most common cause of this condition is the virus that causes the common cold. ?Bacteria. This is less common. ?Breathing in substances that irritate the lungs, including: ?Smoke from cigarettes and other forms of tobacco. ?Dust and pollen. ?Fumes from household cleaning products, gases, or burned fuel. ?Indoor or outdoor air pollution. ?What increases the risk? ?The following factors may make you more likely to develop this condition: ?A weak body's defense system, also called the immune system. ?A condition that affects your lungs and breathing, such as asthma. ?What are the signs or symptoms? ?Common symptoms of this condition include: ?Coughing. This may bring up clear, yellow, or green mucus from your lungs (sputum). ?Wheezing. ?Runny or stuffy nose. ?Having too much mucus in your lungs (chest congestion). ?Shortness of breath. ?Aches and pains, including sore throat or chest. ?How is this diagnosed? ?This condition is usually diagnosed based on: ?Your symptoms and medical history. ?A physical exam. ?You may also have other tests, including tests to rule out other conditions, such as pneumonia. These tests include: ?A test of lung function. ?Test of a mucus sample to look for the presence of bacteria. ?Tests to check the oxygen level in your blood. ?Blood  tests. ?Chest X-ray. ?How is this treated? ?Most cases of acute bronchitis clear up over time without treatment. Your health care provider may recommend: ?Drinking more fluids to help thin your mucus so it is easier to cough up. ?Taking inhaled medicine (inhaler) to improve air flow in and out of your lungs. ?Using a vaporizer or a humidifier. These are machines that add water to the air to help you breathe better. ?Taking a medicine that thins mucus and clears congestion (expectorant). ?Taking a medicine that prevents or stops coughing (cough suppressant). ?It is notcommon to take an antibiotic medicine for this condition. ?Follow these instructions at home: ? ?Take over-the-counter and prescription medicines only as told by your health care provider. ?Use an inhaler, vaporizer, or humidifier as told by your health care provider. ?Take two teaspoons (10 mL) of honey at bedtime to lessen coughing at night. ?Drink enough fluid to keep your urine pale yellow. ?Do not use any products that contain nicotine or tobacco. These products include cigarettes, chewing tobacco, and vaping devices, such as e-cigarettes. If you need help quitting, ask your health care provider. ?Get plenty of rest. ?Return to your normal activities as told by your health care provider. Ask your health care provider what activities are safe for you. ?Keep all follow-up visits. This is important. ?How is this prevented? ?To lower your risk of getting this condition again: ?Wash your hands often with soap and water for at least 20 seconds. If soap and water are not available, use hand sanitizer. ?Avoid contact with people who have cold symptoms. ?Try not to touch your mouth, nose, or eyes with your hands. ?Avoid breathing in smoke  or chemical fumes. Breathing smoke or chemical fumes will make your condition worse. ?Get the flu shot every year. ?Contact a health care provider if: ?Your symptoms do not improve after 2 weeks. ?You have trouble coughing  up the mucus. ?Your cough keeps you awake at night. ?You have a fever. ?Get help right away if you: ?Cough up blood. ?Feel pain in your chest. ?Have severe shortness of breath. ?Faint or keep feeling like you are going to faint. ?Have a severe headache. ?Have a fever or chills that get worse. ?These symptoms may represent a serious problem that is an emergency. Do not wait to see if the symptoms will go away. Get medical help right away. Call your local emergency services (911 in the [place] Do not drive yourself to the hospital. ?Summary ?Acute bronchitis is inflammation of the main airways (bronchi) that come off the windpipe (trachea) in the lungs. The swelling causes the airways to get smaller and make more mucus than normal. ?Drinking more fluids can help thin your mucus so it is easier to cough up. ?Take over-the-counter and prescription medicines only as told by your health care provider. ?Do not use any products that contain nicotine or tobacco. These products include cigarettes, chewing tobacco, and vaping devices, such as e-cigarettes. If you need help quitting, ask your health care provider. ?Contact a health care provider if your symptoms do not improve after 2 weeks. ?This information is not intended to replace advice given to you by your health care provider. Make sure you discuss any questions you have with your health care provider. ?Document Revised: 02/16/2021 Document Reviewed: 02/16/2021 ?Elsevier Patient Education ? [place]. ? ?

## 2022-03-08 NOTE — Chronic Care Management (AMB) (Signed)
?Chronic Care Management  ? ?CCM RN Visit Note ? ?03/08/2022 ?Name: [name] MRN: [id number] DOB: 10/31/1940 ? ?Subjective: ?[name] is a 81 y.o. year old female who is a primary care patient of [name], MD. The care management team was consulted for assistance with disease management and care coordination needs.   ? ?Engaged with patient by telephone for follow up visit in response to provider referral for case management and/or care coordination services.  ? ?Consent to Services:  ?The patient was given information about Chronic Care Management services, agreed to services, and gave verbal consent prior to initiation of services.  Please see initial visit note for detailed documentation.  ? ?Patient agreed to services and verbal consent obtained.  ? ?Assessment: Review of patient past medical history, allergies, medications, health status, including review of consultants reports, laboratory and other test data, was performed as part of comprehensive evaluation and provision of chronic care management services.  ? ?SDOH (Social Determinants of Health) assessments and interventions performed:   ? ?CCM Care Plan ? ?Allergies  ?Allergen Reactions  ? Atorvastatin Itching  ?  Other reaction(s): Itching ?  ? Atorvastatin Calcium Itching  ? Bee Venom Hives  ? Hydrocodone-Acetaminophen Other (See Comments)  ?  hallucinations ?hallucinations  ? Oxycodone Other (See Comments)  ?  hallucinations  ? Vicodin [Hydrocodone-Acetaminophen] Other (See Comments)  ?  hallucinations  ? ? ?Outpatient Encounter Medications as of 03/08/2022  ?Medication Sig  ? Dulaglutide (TRULICITY) 5.46 EV/0.3JK SOPN Inject 0.5 mg into the skin every 14 (fourteen) days.  ? valsartan (DIOVAN) 80 MG tablet Take 1 tablet (80 mg total) by mouth daily.  ? allopurinol (ZYLOPRIM) 300 MG tablet Take 1 tablet by mouth daily  ? aspirin EC 81 MG tablet Take 1 tablet (81 mg total) by mouth daily.  ? atenolol-chlorthalidone (TENORETIC) 50-25 MG  tablet Take 1 tablet by mouth daily.  ? benzonatate (TESSALON PERLES) 100 MG capsule Take 1 capsule (100 mg total) by mouth 3 (three) times daily as needed for cough.  ? Blood Pressure KIT 1 Units by Does not apply route daily. Check blood pressure daily for hypertension dx code I10  ? diclofenac Sodium (VOLTAREN) 1 % GEL Apply 4 g topically 4 (four) times daily. Apply to back  ? hydrocortisone cream 1 % Apply to affected area 2 times daily  ? Lancets (ONETOUCH DELICA PLUS [id number]) MISC USE TO CHECK BLOOD SUGAR TWICE DAILY  ? lidocaine (LIDODERM) 5 % Place 1 patch onto the skin daily. Remove & Discard patch within 12 hours or as directed by MD  ? Magnesium 250 MG TABS Take 1 tablet (250 mg total) by mouth every evening. Take with dinner meal  ? Multiple Vitamin (MULTIVITAMIN) tablet Take 1 tablet by mouth daily.  ? Multiple Vitamins-Minerals (ZINC PO) Take 1 tablet by mouth as needed.  ? ONETOUCH VERIO test strip USE TO TEST 2 TIMES DAILY  ? rosuvastatin (CRESTOR) 20 MG tablet TAKE 1 TABLET(20 MG) BY MOUTH DAILY  ? Saccharomyces boulardii (PROBIOTIC) 250 MG CAPS 1 tablet by mouth daily  ? solifenacin (VESICARE) 5 MG tablet Take 5 mg by mouth every morning.  ? tiZANidine (ZANAFLEX) 4 MG tablet Take 0.5 tablets (2 mg total) by mouth every 6 (six) hours as needed for muscle spasms.  ? vitamin B-12 (CYANOCOBALAMIN) 100 MCG tablet Take 1 tablet by mouth daily  ? [DISCONTINUED] Vitamin D, Ergocalciferol, (DRISDOL) 1.25 MG (50000 UNIT) CAPS capsule TAKE 1 CAPSULE BY MOUTH TWICE  WEEKLY ON [place]  ? ?No facility-administered encounter medications on file as of 03/08/2022.  ? ? ?Patient Active Problem List  ? Diagnosis Date Noted  ? Shortness of breath 09/14/2021  ? Stage 3a chronic kidney disease ([place]) 07/17/2021  ? 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  ? Aortic atherosclerosis ([place]) 01/03/2021  ? Class 2 severe obesity due to excess calories with serious comorbidity and body mass index (BMI) of 39.0 to 39.9 in  adult [facility]) 01/03/2021  ? Pruritus 01/03/2021  ? Vitamin D deficiency 06/01/2020  ? Pain in joint of right hip 05/11/2020  ? History of arthroplasty of right knee 05/11/2020  ? Abdominal bloating 02/17/2019  ? Urinary frequency 02/17/2019  ? Uncontrolled type 2 diabetes mellitus with hyperglycemia ([place]) 02/17/2019  ? Lightheaded 02/17/2019  ? Constipation 06/26/2018  ? Hyperlipidemia 08/23/2014  ? Type 2 diabetes mellitus with stage 3 chronic kidney disease, without long-term current use of insulin ([place]) 06/19/2014  ? Mixed hyperlipidemia 06/19/2014  ? S/P urological surgery 09/30/2013  ? Joint pain 09/30/2013  ? UTI (urinary tract infection) 09/30/2013  ? Cough 12/10/2012  ? OA (osteoarthritis) of knee 01/05/2012  ? 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 Vaginal discharge 08/02/2011  ? Spasm of lumbar paraspinous muscle 06/17/2011  ? Morbidly obese ([place]) 05/31/2011  ? HIP PAIN, LEFT, CHRONIC 11/09/2010  ? CARPAL TUNNEL SYNDROME, RIGHT 10/14/2010  ? KNEE PAIN, RIGHT, CHRONIC 10/14/2010  ? LBBB (left bundle branch block) 07/26/2010  ? HELICOBACTER PYLORI GASTRITIS 07/18/2010  ? FATIGUE 07/01/2010  ? CHEST PAIN, ATYPICAL 07/01/2010  ? EPIGASTRIC PAIN 07/01/2010  ? SUPRAPUBIC PAIN 07/01/2010  ? HYPERGLYCEMIA 07/01/2010  ? [place] SITE 02/15/2010  ? LUMP OR MASS IN BREAST 01/10/2010  ? DYSURIA, CHRONIC 03/09/2009  ? GERD 10/06/2008  ? Personal history of colonic polyps-adenoma 08/26/2008  ? LOC OSTEOARTHROS NOT SPEC PRIM/SEC LOWER LEG 09/10/2007  ? GOUT, UNSPECIFIED 09/09/2007  ? DIVERTICULOSIS, COLON 09/09/2007  ? DIVERTICULITIS, HX OF 09/09/2007  ? HYPERLIPIDEMIA 06/03/2007  ? Essential hypertension 06/03/2007  ? ? ?Conditions to be addressed/monitored: DM, HTN, CKD III, Vitamin D deficiency  ? ?Care Plan : RN Care Manager Plan of Care  ?Updates made by Lynne Logan, RN since 03/08/2022 12:00 AM  ?  ? ?Problem: No Plan of Care Established for management of chronic  disease states (DM, HTN, CKD III, Vitamin D deficiency)   ?Priority: High  ?  ? ?Long-Range Goal: Establishment of plan of care for management of chronic disease states (DM, HTN, CKD III, Vitamin D deficiency)   ?Start Date: 11/04/2021  ?Expected End Date: 11/03/2022  ?Recent Progress: On track  ?Priority: High  ?Note:   ?Current Barriers:  ?Knowledge Deficits related to plan of care for management of DM, HTN, CKD III, Vitamin D deficiency   ?Chronic Disease Management support and education needs related to DM, HTN, CKD III, Vitamin D deficiency   ? ?RNCM Clinical Goal(s):  ?Patient will verbalize basic understanding of  DM, HTN, CKD III, Vitamin D deficiency  disease process and self health management plan as evidenced by patient will report having no disease exacerbations related to her chronic disease states as listed above  ?take all medications exactly as prescribed and will call provider for medication related questions as evidenced by patient will report having no missed doses of her prescribed medications  ?demonstrate Improved health management independence as evidenced by patient will report 100% of her prescribed treatment plan  ?continue to work  with RN Care Manager to address care management and care coordination needs related to  DM, HTN, CKD III, Vitamin D deficiency  as evidenced by adherence to CM Team Scheduled appointments ?demonstrate ongoing self health care management ability   as evidenced by    through collaboration with RN Care manager, provider, and care team.  ? ?Interventions: ?1:1 collaboration with primary care provider regarding development and update of comprehensive plan of care as evidenced by provider attestation and co-signature ?Inter-disciplinary care team collaboration (see longitudinal plan of care) ?Evaluation of current treatment plan related to  self management and patient's adherence to plan as established by provider ? ? Chronic Kidney Disease Interventions:  (Status:  Goal  on track:  Yes.) Long Term Goal ?Assessed the Patient understanding of chronic kidney disease    ?Evaluation of current treatment plan related to chronic kidney disease self management and patient's adhe

## 2022-03-08 NOTE — Patient Instructions (Signed)
Visit Information ? ?Thank you for taking time to visit with me today. Please don't hesitate to contact me if I can be of assistance to you before our next scheduled telephone appointment. ? ?Following are the goals we discussed today:  ?(Copy and paste patient goals from clinical care plan here) ? ?Our next appointment is by telephone on 04/05/22 at 11:15 AM ? ?Please call the care guide team at [phone] if you need to cancel or reschedule your appointment.  ? ?If you are experiencing a Mental Health or [place] or need someone to talk to, please call 1-800-273-TALK (toll free, 24 hour hotline)  ? ?Patient verbalizes understanding of instructions and care plan provided today and agrees to view in [place]. Active MyChart status confirmed with patient.   ? ?Barb Merino, RN, BSN, CCM ?Care Management Coordinator ?[place] [facility]  ?Direct Phone: [phone] ? ? ?

## 2022-03-08 NOTE — Progress Notes (Signed)
?Rich Brave Llittleton,acting as a Education administrator for Maximino Greenland, MD.,have documented all relevant documentation on the behalf of Maximino Greenland, MD,as directed by  Maximino Greenland, MD while in the presence of Maximino Greenland, MD.  ?This visit occurred during the SARS-CoV-2 public health emergency.  Safety protocols were in place, including screening questions prior to the visit, additional usage of staff PPE, and extensive cleaning of exam room while observing appropriate contact time as indicated for disinfecting solutions. ? ?Subjective:  ?  ? Patient ID: [name] , female    DOB: 1941/04/13 , 81 y.o.   MRN: [id number] ? ? ?Chief Complaint  ?Patient presents with  ? Cough  ? ? ?HPI ? ?Patient presents today for a sick visit. She was seen by CCM-Nurse today who requested pt be seen today for further evaluation of cough. She was seen a week ago for similar sx and prescribed Tessalon perles. She is concerned b/c her sx have not improved. She denies fever/chills. However, she feels drained and her cough is now productive of green sputum. Additionally, she reports she feels like she has a rash in her mouth. She reports her tongue gets stuck to the top of her mouth, she wakes up with her mouth feeling sore. She also reports not being able to sleep at night due to the mouth discomfort.  ? ?Cough ?This is a new problem. The current episode started more than 1 month ago. The problem has been gradually improving. The problem occurs every few hours. Cough characteristics: green sputum. Associated symptoms include a rash (left arm rash noticed after starting valsartan from cardiology). Pertinent negatives include no chest pain.  ?Diabetes ?She presents for her follow-up diabetic visit. She has type 2 diabetes mellitus. There are no hypoglycemic associated symptoms. Pertinent negatives for diabetes include no blurred vision and no chest pain. There are no hypoglycemic complications. Risk factors for coronary artery  disease include diabetes mellitus, dyslipidemia, hypertension, obesity, sedentary lifestyle and post-menopausal. She is compliant with treatment most of the time. She participates in exercise intermittently. An ACE inhibitor/angiotensin II receptor blocker is being taken. Eye exam is current.  ?Hypertension ?This is a chronic problem. The current episode started more than 1 year ago. The problem has been gradually improving since onset. The problem is controlled. Pertinent negatives include no blurred vision or chest pain. Risk factors for coronary artery disease include diabetes mellitus, dyslipidemia, obesity, post-menopausal state and sedentary lifestyle.   ? ?Past Medical History:  ?Diagnosis Date  ? Abdominal pain   ? Arthritis   ? cervical disc degeneration/ oa left knee, carpal tunnel rt wrist, adhesive capsulitis right shoulder, rt hand weakness; lumbar degeneration  ? Carpal tunnel syndrome   ? Complication of anesthesia 2006-at [facility]  ? 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 Constipation   ? Diabetes mellitus without complication ([place])   ? borderline - diet control  ? Diverticulitis hx of  ? Diverticulosis   ? E. coli infection   ? 2020  ? Frequent UTI   ? hx of urethral injury during colon surgery - states frequent uti's since  ? GERD (gastroesophageal reflux disease)   ? H/O hiatal hernia   ? History of palpitations   ? in the past  ? History of shingles   ? has a lingering itching on back where shingles  were  ? Hyperlipidemia   ? Hypertension   ? Numbness and tingling in right hand   ? pt. states has numbness of right hand very frequently-watch positioning  ? Obesity   ? Osteoarthritis   ? Osteoporosis   ? Pain   ? pain left knee and pain right hip and right groin  ? Personal history of colonic polyps-adenoma 08/26/2008  ? Pneumonia 2005  ? Shortness of breath 09/14/2021  ?  Vitamin D deficiency   ?  ? ?Family History  ?Problem Relation Age of Onset  ? Breast cancer Mother 17  ? Hypertension Mother   ? Prostate cancer Father   ? Hypertension Father   ? Dementia Sister   ? Lung cancer Brother   ? Hypertension Brother   ? COPD Brother   ? Cancer Maternal Grandmother   ? Arthritis Sister   ? Hypertension Sister   ? Arthritis Sister   ? Hypertension Child   ? Hypertension Child   ? Hypertension Child   ? Colon polyps Neg Hx   ? Esophageal cancer Neg Hx   ? Rectal cancer Neg Hx   ? Stomach cancer Neg Hx   ? Colon cancer Neg Hx   ? ? ? ?Current Outpatient Medications:  ?  allopurinol (ZYLOPRIM) 300 MG tablet, Take 1 tablet by mouth daily, Disp: 90 tablet, Rfl: 1 ?  amoxicillin (AMOXIL) 500 MG capsule, Take 1 capsule (500 mg total) by mouth 3 (three) times daily., Disp: 21 capsule, Rfl: 0 ?  aspirin EC 81 MG tablet, Take 1 tablet (81 mg total) by mouth daily., Disp: 90 tablet, Rfl: 1 ?  atenolol-chlorthalidone (TENORETIC) 50-25 MG tablet, Take 1 tablet by mouth daily., Disp: 90 tablet, Rfl: 1 ?  benzonatate (TESSALON PERLES) 100 MG capsule, Take 1 capsule (100 mg total) by mouth 3 (three) times daily as needed for cough., Disp: 30 capsule, Rfl: 1 ?  Blood Pressure KIT, 1 Units by Does not apply route daily. Check blood pressure daily for hypertension dx code I10, Disp: 1 kit, Rfl: 0 ?  diclofenac Sodium (VOLTAREN) 1 % GEL, Apply 4 g topically 4 (four) times daily. Apply to back, Disp: 100 g, Rfl: 0 ?  Dulaglutide (TRULICITY) 5.45 GY/5.6LS SOPN, Inject 0.5 mg into the skin every 14 (fourteen) days., Disp: , Rfl:  ?  hydrocortisone cream 1 %, Apply to affected area 2 times daily, Disp: 30 g, Rfl: 1 ?  Lancets (ONETOUCH DELICA PLUS [id number]) MISC, USE TO CHECK BLOOD SUGAR TWICE DAILY, Disp: 100 each, Rfl: 3 ?  lidocaine (LIDODERM) 5 %, Place 1 patch onto the skin daily. Remove & Discard patch within 12 hours or as directed by MD, Disp: 30 patch, Rfl: 0 ?  Magnesium 250 MG TABS, Take 1 tablet  (250 mg total) by mouth every evening. Take with dinner meal, Disp: 90 tablet, Rfl: 1 ?  Multiple Vitamin (MULTIVITAMIN) tablet, Take 1 tablet by mouth daily., Disp: 90 tablet, Rfl: 1 ?  Multiple Vitamins-Minerals (ZINC PO), Take 1 tablet by mouth as needed., Disp: , Rfl:  ?  ONETOUCH VERIO test strip, USE TO TEST 2 TIMES DAILY, Disp: 100 strip, Rfl: 3 ?  rosuvastatin (CRESTOR) 20 MG tablet, TAKE 1 TABLET(20 MG) BY MOUTH DAILY, Disp: 90 tablet, Rfl: 1 ?  Saccharomyces boulardii (PROBIOTIC) 250 MG CAPS, 1 tablet by mouth daily, Disp: 90 capsule, Rfl: 1 ?  solifenacin (VESICARE) 5 MG tablet, Take 5 mg by mouth every morning., Disp: , Rfl:  ?  tiZANidine (ZANAFLEX) 4 MG tablet, Take 0.5 tablets (2 mg total) by mouth every 6 (six) hours as needed for muscle spasms., Disp: 30 tablet, Rfl: 0 ?  valsartan (DIOVAN) 80 MG tablet, Take 1 tablet (80 mg total) by mouth daily., Disp: 90 tablet, Rfl: 3 ?  vitamin B-12 (CYANOCOBALAMIN) 100 MCG tablet, Take 1 tablet by mouth daily, Disp: 90 tablet, Rfl: 1 ?  Vitamin D, Ergocalciferol, (DRISDOL) 1.25 MG (50000 UNIT) CAPS capsule, TAKE ONE CAPSULE BY MOUTH ONCE WEEKLY BEFORE BREAKFAST ON  TUESDAYS and TAKE ONE CAPSULE BY MOUTH ONCE WEEKLY BEFORE BREAKFAST ON FRIDAYS, Disp: 26 capsule, Rfl: 0  ? ?Allergies  ?Allergen Reactions  ? Atorvastatin Itching  ?  Other reaction(s): Itching ?  ? Atorvastatin Calcium Itching  ? Bee Venom Hives  ? Hydrocodone-Acetaminophen Other (See Comments)  ?  hallucinations ?hallucinations  ? Oxycodone Other (See Comments)  ?  hallucinations  ? Vicodin [Hydrocodone-Acetaminophen] Other (See Comments)  ?  hallucinations  ?  ? ?Review of Systems  ?Constitutional: Negative.   ?Eyes:  Negative for blurred vision.  ?Respiratory:  Positive for cough.   ?Cardiovascular: Negative.  Negative for chest pain.  ?Gastrointestinal: Negative.   ?Skin:  Positive for rash (left arm rash noticed after starting valsartan from cardiology).  ?Neurological: Negative.    ?Psychiatric/Behavioral: Negative.     ? ?Today's Vitals  ? 03/08/22 1528  ?BP: 132/80  ?Pulse: 63  ?Temp: 97.7 ?F (36.5 ?C)  ?Weight: 238 lb 3.2 oz (108 kg)  ?Height: '5\' 6"'  (1.676 m)  ?PainSc: 0-No pain  ? ?Body mass i

## 2022-03-21 ENCOUNTER — Encounter (HOSPITAL_BASED_OUTPATIENT_CLINIC_OR_DEPARTMENT_OTHER): Payer: Self-pay | Admitting: Pediatrics

## 2022-03-21 ENCOUNTER — Emergency Department (HOSPITAL_BASED_OUTPATIENT_CLINIC_OR_DEPARTMENT_OTHER)
Admission: EM | Admit: 2022-03-21 | Discharge: 2022-03-21 | Disposition: A | Discharge status: Home / Self Care | Payer: Medicare Other | Attending: Emergency Medicine | Admitting: Emergency Medicine

## 2022-03-21 ENCOUNTER — Telehealth: Payer: Self-pay

## 2022-03-21 ENCOUNTER — Other Ambulatory Visit: Payer: Self-pay

## 2022-03-21 DIAGNOSIS — K148 Other diseases of tongue: Secondary | ICD-10-CM | POA: Diagnosis not present

## 2022-03-21 DIAGNOSIS — K1329 Other disturbances of oral epithelium, including tongue: Secondary | ICD-10-CM | POA: Diagnosis not present

## 2022-03-21 DIAGNOSIS — K149 Disease of tongue, unspecified: Secondary | ICD-10-CM

## 2022-03-21 NOTE — ED Triage Notes (Signed)
Reported mouth swelling worst when she is laying down and relaxed; the tongue sticks to roof of mouth.

## 2022-03-21 NOTE — ED Provider Notes (Signed)
[name] Provider Note   CSN: [id number] Arrival date & time: 03/21/22  1049     History {Add pertinent medical, surgical, social history, OB history to HPI:1} Chief Complaint  Patient presents with   Oral Swelling    [name] is a 81 y.o. female.  HPI     Home Medications Prior to Admission medications   Medication Sig Start Date End Date Taking? Authorizing Provider  allopurinol (ZYLOPRIM) 300 MG tablet Take 1 tablet by mouth daily 02/02/22   [name], MD  amoxicillin (AMOXIL) 500 MG capsule Take 1 capsule (500 mg total) by mouth 3 (three) times daily. 03/08/22   [name], MD  aspirin EC 81 MG tablet Take 1 tablet (81 mg total) by mouth daily. 02/02/22   [name], MD  atenolol-chlorthalidone (TENORETIC) 50-25 MG tablet Take 1 tablet by mouth daily. 11/07/21   [name], MD  benzonatate (TESSALON PERLES) 100 MG capsule Take 1 capsule (100 mg total) by mouth 3 (three) times daily as needed for cough. 03/01/22 03/01/23  [name], FNP  Blood Pressure KIT 1 Units by Does not apply route daily. Check blood pressure daily for hypertension dx code I10 09/14/21   Skeet Latch, MD  diclofenac Sodium (VOLTAREN) 1 % GEL Apply 4 g topically 4 (four) times daily. Apply to back 11/26/21   Palumbo, April, MD  Dulaglutide (TRULICITY) 6.37 CH/8.8FO SOPN Inject 0.5 mg into the skin every 14 (fourteen) days.    [provider]  hydrocortisone cream 1 % Apply to affected area 2 times daily 03/01/22 03/01/23  [name], FNP  Lancets [facility] DELICA PLUS [id number]) [place] USE TO CHECK BLOOD SUGAR TWICE DAILY 12/24/20   [name], MD  lidocaine (LIDODERM) 5 % Place 1 patch onto the skin daily. Remove & Discard patch within 12 hours or as directed by MD 11/26/21   Randal Buba, April, MD  Magnesium 250 MG TABS Take 1 tablet (250 mg total) by mouth every evening. Take with dinner meal 08/18/21   [name], MD  Multiple Vitamin  (MULTIVITAMIN) tablet Take 1 tablet by mouth daily. 08/18/21   [name], MD  Multiple Vitamins-Minerals (ZINC PO) Take 1 tablet by mouth as needed.    [provider]  [facility] VERIO test strip USE TO TEST 2 TIMES DAILY 12/20/20   [name], MD  rosuvastatin (CRESTOR) 20 MG tablet TAKE 1 TABLET(20 MG) BY MOUTH DAILY 02/02/22   [name], MD  Saccharomyces boulardii (PROBIOTIC) 250 MG CAPS 1 tablet by mouth daily 08/18/21   [name], MD  solifenacin (VESICARE) 5 MG tablet Take 5 mg by mouth every morning. 02/09/22   [provider]  tiZANidine (ZANAFLEX) 4 MG tablet Take 0.5 tablets (2 mg total) by mouth every 6 (six) hours as needed for muscle spasms. 11/27/21   Luna Fuse, MD  valsartan (DIOVAN) 80 MG tablet Take 1 tablet (80 mg total) by mouth daily. 09/14/21   Skeet Latch, MD  vitamin B-12 (CYANOCOBALAMIN) 100 MCG tablet Take 1 tablet by mouth daily 08/18/21   [name], MD  Vitamin D, Ergocalciferol, (DRISDOL) 1.25 MG (50000 UNIT) CAPS capsule TAKE ONE CAPSULE BY MOUTH ONCE WEEKLY BEFORE BREAKFAST ON  TUESDAYS and TAKE ONE CAPSULE BY MOUTH ONCE WEEKLY BEFORE BREAKFAST ON FRIDAYS 03/08/22   [name], MD      Allergies    Atorvastatin, Atorvastatin calcium, Bee venom, Hydrocodone-acetaminophen, Oxycodone, and Vicodin [hydrocodone-acetaminophen]    Review of Systems  Review of Systems  Physical Exam Updated Vital Signs BP (!) 148/84 (BP Location: Left Arm)   Pulse (!) 55   Temp 97.8 F (36.6 C) (Oral)   Resp 18   Ht _0  (1.676 m)   Wt 108 kg   SpO2 97%   BMI 38.45 kg/m  Physical Exam  ED Results / Procedures / Treatments   Labs (all labs ordered are listed, but only abnormal results are displayed) Labs Reviewed - No data to display  EKG None  Radiology No results found.  Procedures Procedures  {Document cardiac monitor, telemetry assessment procedure when appropriate:1}  Medications Ordered in ED Medications -  No data to display  ED Course/ Medical Decision Making/ A&P                           Medical Decision Making  ***  {Document critical care time when appropriate:1} {Document review of labs and clinical decision tools ie heart score, Chads2Vasc2 etc:1}  {Document your independent review of radiology images, and any outside records:1} {Document your discussion with family members, caretakers, and with consultants:1} {Document social determinants of health affecting pt's care:1} {Document your decision making why or why not admission, treatments were needed:1} Final Clinical Impression(s) / ED Diagnoses Final diagnoses:  None    Rx / DC Orders ED Discharge Orders     None

## 2022-03-21 NOTE — Discharge Instructions (Signed)
Please follow-up with your primary care doctor as well as ear nose and throat doctor.  Call both of their offices today to request close follow-up appointments.  If at any point you develop difficulty swallowing, difficulty breathing, increasing swelling or other new concerning symptom, please return to ER for reassessment.

## 2022-03-22 ENCOUNTER — Telehealth: Payer: Self-pay

## 2022-03-22 NOTE — Telephone Encounter (Signed)
Spoke with patient after visiting the ED. She states she does not know why the incident happened, she feels a little bit better but she is still concerned. Patient aware to give her dentist a call, and after she scheduled's an appointment with them to let us know. Patient reports she is not established with a ENT, provider notified.

## 2022-03-22 NOTE — Telephone Encounter (Signed)
Error

## 2022-03-23 DIAGNOSIS — E559 Vitamin D deficiency, unspecified: Secondary | ICD-10-CM | POA: Diagnosis not present

## 2022-03-23 DIAGNOSIS — R300 Dysuria: Secondary | ICD-10-CM | POA: Diagnosis not present

## 2022-03-23 DIAGNOSIS — E7800 Pure hypercholesterolemia, unspecified: Secondary | ICD-10-CM | POA: Diagnosis not present

## 2022-03-23 DIAGNOSIS — E119 Type 2 diabetes mellitus without complications: Secondary | ICD-10-CM | POA: Diagnosis not present

## 2022-03-23 DIAGNOSIS — R5383 Other fatigue: Secondary | ICD-10-CM | POA: Diagnosis not present

## 2022-03-23 DIAGNOSIS — Z79899 Other long term (current) drug therapy: Secondary | ICD-10-CM | POA: Diagnosis not present

## 2022-03-23 DIAGNOSIS — R030 Elevated blood-pressure reading, without diagnosis of hypertension: Secondary | ICD-10-CM | POA: Diagnosis not present

## 2022-03-23 DIAGNOSIS — Z0000 Encounter for general adult medical examination without abnormal findings: Secondary | ICD-10-CM | POA: Diagnosis not present

## 2022-03-24 DIAGNOSIS — Z780 Asymptomatic menopausal state: Secondary | ICD-10-CM | POA: Diagnosis not present

## 2022-03-29 DIAGNOSIS — E1122 Type 2 diabetes mellitus with diabetic chronic kidney disease: Secondary | ICD-10-CM

## 2022-03-29 DIAGNOSIS — I129 Hypertensive chronic kidney disease with stage 1 through stage 4 chronic kidney disease, or unspecified chronic kidney disease: Secondary | ICD-10-CM | POA: Diagnosis not present

## 2022-03-29 DIAGNOSIS — N1831 Chronic kidney disease, stage 3a: Secondary | ICD-10-CM

## 2022-03-30 DIAGNOSIS — Z0130 Encounter for examination of blood pressure without abnormal findings: Secondary | ICD-10-CM | POA: Diagnosis not present

## 2022-03-30 DIAGNOSIS — E559 Vitamin D deficiency, unspecified: Secondary | ICD-10-CM | POA: Diagnosis not present

## 2022-03-30 DIAGNOSIS — E119 Type 2 diabetes mellitus without complications: Secondary | ICD-10-CM | POA: Diagnosis not present

## 2022-03-30 DIAGNOSIS — I10 Essential (primary) hypertension: Secondary | ICD-10-CM | POA: Diagnosis not present

## 2022-04-03 ENCOUNTER — Telehealth: Payer: Self-pay

## 2022-04-03 NOTE — Chronic Care Management (AMB) (Signed)
Chronic Care Management Pharmacy Assistant   Name: [name]  MRN: [id number] DOB: 01/10/1941  Reason for Encounter: Medication Review/ Medication coordination  Recent office visits:  03-08-2022 [name], MD. Referral placed to neurology. START amoxicillin 500 mg 3 times daily. Rocephin and kenelog given.  03-08-2022 Little, Claudette Stapler, RN (CCM)  Recent consult visits:  None  Hospital visits:  Medication Reconciliation was completed by comparing discharge summary, patient's EMR and Pharmacy list, and upon discussion with patient.  Admitted to the hospital on 03-21-2022 due to Tongue irritation. Discharge date was 03-21-2022. Discharged from [place]?Medications Started at [facility] Discharge:?? None  Medication Changes at Hospital Discharge: None  Medications Discontinued at Hospital Discharge: None  Medications that remain the same after Hospital Discharge:??  -All other medications will remain the same.    Hospital visits:  Medication Reconciliation was completed by comparing discharge summary, patient's EMR and Pharmacy list, and upon discussion with patient.   Admitted to the hospital on 03-02-2022 due to Fall. Discharge date was 03-02-2022. Discharged from [facility] emergency department.   New?Medications Started at [facility] Discharge:?? None   Medication Changes at Hospital Discharge: None   Medications Discontinued at Hospital Discharge: None   Medications that remain the same after Hospital Discharge:??  -All other medications will remain the same.     Medications: Outpatient Encounter Medications as of 04/03/2022  Medication Sig   allopurinol (ZYLOPRIM) 300 MG tablet Take 1 tablet by mouth daily   amoxicillin (AMOXIL) 500 MG capsule Take 1 capsule (500 mg total) by mouth 3 (three) times daily.   aspirin EC 81 MG tablet Take 1 tablet (81 mg total) by mouth daily.   atenolol-chlorthalidone (TENORETIC)  50-25 MG tablet Take 1 tablet by mouth daily.   benzonatate (TESSALON PERLES) 100 MG capsule Take 1 capsule (100 mg total) by mouth 3 (three) times daily as needed for cough.   Blood Pressure KIT 1 Units by Does not apply route daily. Check blood pressure daily for hypertension dx code I10   diclofenac Sodium (VOLTAREN) 1 % GEL Apply 4 g topically 4 (four) times daily. Apply to back   Dulaglutide (TRULICITY) 7.78 EU/2.3NT SOPN Inject 0.5 mg into the skin every 14 (fourteen) days.   hydrocortisone cream 1 % Apply to affected area 2 times daily   Lancets (ONETOUCH DELICA PLUS [id number]) MISC USE TO CHECK BLOOD SUGAR TWICE DAILY   lidocaine (LIDODERM) 5 % Place 1 patch onto the skin daily. Remove & Discard patch within 12 hours or as directed by MD   Magnesium 250 MG TABS Take 1 tablet (250 mg total) by mouth every evening. Take with dinner meal   Multiple Vitamin (MULTIVITAMIN) tablet Take 1 tablet by mouth daily.   Multiple Vitamins-Minerals (ZINC PO) Take 1 tablet by mouth as needed.   ONETOUCH VERIO test strip USE TO TEST 2 TIMES DAILY   rosuvastatin (CRESTOR) 20 MG tablet TAKE 1 TABLET(20 MG) BY MOUTH DAILY   Saccharomyces boulardii (PROBIOTIC) 250 MG CAPS 1 tablet by mouth daily   solifenacin (VESICARE) 5 MG tablet Take 5 mg by mouth every morning.   tiZANidine (ZANAFLEX) 4 MG tablet Take 0.5 tablets (2 mg total) by mouth every 6 (six) hours as needed for muscle spasms.   valsartan (DIOVAN) 80 MG tablet Take 1 tablet (80 mg total) by mouth daily.   vitamin B-12 (CYANOCOBALAMIN) 100 MCG tablet Take 1 tablet by mouth daily   Vitamin D,  Ergocalciferol, (DRISDOL) 1.25 MG (50000 UNIT) CAPS capsule TAKE ONE CAPSULE BY MOUTH ONCE WEEKLY BEFORE BREAKFAST ON  TUESDAYS and TAKE ONE CAPSULE BY MOUTH ONCE WEEKLY BEFORE BREAKFAST ON FRIDAYS   No facility-administered encounter medications on file as of 04/03/2022.  Reviewed chart for medication changes ahead of medication coordination call.  No Consults  since last care coordination call/Pharmacist visit.   No medication changes indicated OR if recent visit, treatment plan here.  BP Readings from Last 3 Encounters:  03/21/22 (!) 148/84  03/08/22 132/80  03/02/22 (!) 167/73    Lab Results  Component Value Date   HGBA1C 6.9 (H) 03/01/2022     Patient obtains medications through Adherence Packaging  30 Days   Last adherence delivery included:  Valsartan 80 mg before breakfast Rosuvastatin 20 mg daily before breakfast Allopurinol 300 mg daily before breakfast Asa 81 mg daily before breakfast Atenolol-Chlorthalidone 50/25 mg daily before breakfast Vitamin D2 1.25 mg- 1 tablet twice a week on Tuesdays and Fridays before breakfast Solifenacin 5 mg before breakfast  Patient declined (meds) last month:  None  Patient is due for next adherence delivery on: 04-13-2022  Called patient and reviewed medications and coordinated delivery.  This delivery to include: Valsartan 80 mg before breakfast Rosuvastatin 20 mg daily before breakfast Atenolol-Chlorthalidone 50/25 mg daily before breakfast Allopurinol 300 mg daily before breakfast Asa 81 mg daily before breakfast Vitamin D2 1.25 mg- 1 tablet twice a week on Tuesdays and Fridays before breakfast  No short/acute fills needed.  Patient declined the following medications: None  Patient needs refills for: None  Confirmed delivery date of 04-13-2022 advised patient that pharmacy will contact them the morning of delivery.  [place] Pharmacist Assistant [phone]

## 2022-04-05 ENCOUNTER — Ambulatory Visit (INDEPENDENT_AMBULATORY_CARE_PROVIDER_SITE_OTHER): Payer: Medicare Other

## 2022-04-05 ENCOUNTER — Telehealth: Payer: Medicare Other

## 2022-04-05 DIAGNOSIS — I129 Hypertensive chronic kidney disease with stage 1 through stage 4 chronic kidney disease, or unspecified chronic kidney disease: Secondary | ICD-10-CM

## 2022-04-05 DIAGNOSIS — E559 Vitamin D deficiency, unspecified: Secondary | ICD-10-CM

## 2022-04-05 DIAGNOSIS — N1831 Type 2 diabetes mellitus with diabetic chronic kidney disease: Secondary | ICD-10-CM

## 2022-04-05 DIAGNOSIS — N183 Chronic kidney disease, stage 3 unspecified: Secondary | ICD-10-CM

## 2022-04-06 NOTE — Chronic Care Management (AMB) (Signed)
Chronic Care Management   CCM RN Visit Note  04/05/2022 Name: [name] MRN: [id number] DOB: Apr 15, 1941  Subjective: [name] is a 81 y.o. year old female who is a primary care patient of [name], MD. The care management team was consulted for assistance with disease management and care coordination needs.    Engaged with patient by telephone for follow up visit in response to provider referral for case management and/or care coordination services.   Consent to Services:  The patient was given information about Chronic Care Management services, agreed to services, and gave verbal consent prior to initiation of services.  Please see initial visit note for detailed documentation.   Patient agreed to services and verbal consent obtained.   Assessment: Review of patient past medical history, allergies, medications, health status, including review of consultants reports, laboratory and other test data, was performed as part of comprehensive evaluation and provision of chronic care management services.   SDOH (Social Determinants of Health) assessments and interventions performed:  Yes, no acute needs   CCM Care Plan  Allergies  Allergen Reactions   Atorvastatin Itching    Other reaction(s): Itching    Atorvastatin Calcium Itching   Bee Venom Hives   Hydrocodone-Acetaminophen Other (See Comments)    hallucinations hallucinations   Oxycodone Other (See Comments)    hallucinations   Vicodin [Hydrocodone-Acetaminophen] Other (See Comments)    hallucinations    Outpatient Encounter Medications as of 04/05/2022  Medication Sig   allopurinol (ZYLOPRIM) 300 MG tablet Take 1 tablet by mouth daily   amoxicillin (AMOXIL) 500 MG capsule Take 1 capsule (500 mg total) by mouth 3 (three) times daily.   aspirin EC 81 MG tablet Take 1 tablet (81 mg total) by mouth daily.   atenolol-chlorthalidone (TENORETIC) 50-25 MG tablet Take 1 tablet by mouth daily.   benzonatate (TESSALON  PERLES) 100 MG capsule Take 1 capsule (100 mg total) by mouth 3 (three) times daily as needed for cough.   Blood Pressure KIT 1 Units by Does not apply route daily. Check blood pressure daily for hypertension dx code I10   diclofenac Sodium (VOLTAREN) 1 % GEL Apply 4 g topically 4 (four) times daily. Apply to back   Dulaglutide (TRULICITY) 5.99 JT/7.0VX SOPN Inject 0.5 mg into the skin every 14 (fourteen) days.   hydrocortisone cream 1 % Apply to affected area 2 times daily   Lancets (ONETOUCH DELICA PLUS [id number]) MISC USE TO CHECK BLOOD SUGAR TWICE DAILY   lidocaine (LIDODERM) 5 % Place 1 patch onto the skin daily. Remove & Discard patch within 12 hours or as directed by MD   Magnesium 250 MG TABS Take 1 tablet (250 mg total) by mouth every evening. Take with dinner meal   Multiple Vitamin (MULTIVITAMIN) tablet Take 1 tablet by mouth daily.   Multiple Vitamins-Minerals (ZINC PO) Take 1 tablet by mouth as needed.   ONETOUCH VERIO test strip USE TO TEST 2 TIMES DAILY   rosuvastatin (CRESTOR) 20 MG tablet TAKE 1 TABLET(20 MG) BY MOUTH DAILY   Saccharomyces boulardii (PROBIOTIC) 250 MG CAPS 1 tablet by mouth daily   solifenacin (VESICARE) 5 MG tablet Take 5 mg by mouth every morning.   tiZANidine (ZANAFLEX) 4 MG tablet Take 0.5 tablets (2 mg total) by mouth every 6 (six) hours as needed for muscle spasms.   valsartan (DIOVAN) 80 MG tablet Take 1 tablet (80 mg total) by mouth daily.   vitamin B-12 (CYANOCOBALAMIN) 100 MCG tablet Take 1  tablet by mouth daily   Vitamin D, Ergocalciferol, (DRISDOL) 1.25 MG (50000 UNIT) CAPS capsule TAKE ONE CAPSULE BY MOUTH ONCE WEEKLY BEFORE BREAKFAST ON  TUESDAYS and TAKE ONE CAPSULE BY MOUTH ONCE WEEKLY BEFORE BREAKFAST ON FRIDAYS   No facility-administered encounter medications on file as of 04/05/2022.    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   Aortic atherosclerosis ([place]) 01/03/2021   Class 2 severe obesity due to excess calories with serious comorbidity and body mass index (BMI) of 39.0 to 39.9 in adult [facility]) 01/03/2021   Pruritus 01/03/2021   Vitamin D deficiency 06/01/2020   Pain in joint of right hip 05/11/2020   History of arthroplasty of right knee 05/11/2020   Abdominal bloating 02/17/2019   Urinary frequency 02/17/2019   Uncontrolled type 2 diabetes mellitus with hyperglycemia ([place]) 02/17/2019   Lightheaded 02/17/2019   Constipation 06/26/2018   Hyperlipidemia 08/23/2014   Type 2 diabetes mellitus with stage 3 chronic kidney disease, without long-term current use of insulin ([place]) 06/19/2014   Mixed hyperlipidemia 06/19/2014   S/P urological surgery 09/30/2013   Joint pain 09/30/2013   UTI (urinary tract infection) 09/30/2013   Cough 12/10/2012   OA (osteoarthritis) of knee 01/05/2012   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 08/02/2011   Vaginal discharge 08/02/2011   Spasm of lumbar paraspinous muscle 06/17/2011   Morbidly obese ([place]) 05/31/2011   HIP PAIN, LEFT, CHRONIC 11/09/2010   CARPAL TUNNEL SYNDROME, RIGHT 10/14/2010   KNEE PAIN, RIGHT, CHRONIC 10/14/2010   LBBB (left bundle branch block) 16/10/930   HELICOBACTER PYLORI GASTRITIS 07/18/2010   FATIGUE 07/01/2010   CHEST PAIN, ATYPICAL 07/01/2010   EPIGASTRIC PAIN 07/01/2010   SUPRAPUBIC PAIN 07/01/2010   HYPERGLYCEMIA 07/01/2010   CELLULITIS AND ABSCESS OF OTHER SPECIFIED SITE 02/15/2010   LUMP OR MASS IN BREAST 01/10/2010   DYSURIA, CHRONIC 03/09/2009   GERD 10/06/2008   Personal history of colonic polyps-adenoma 08/26/2008   LOC OSTEOARTHROS NOT SPEC PRIM/SEC LOWER LEG 09/10/2007   GOUT, UNSPECIFIED 09/09/2007   DIVERTICULOSIS, COLON 09/09/2007   DIVERTICULITIS, HX OF 09/09/2007   HYPERLIPIDEMIA 06/03/2007   Essential hypertension 06/03/2007    Conditions to be addressed/monitored: DM, HTN, CKD III, Vitamin D  deficiency   Care Plan : RN Care Manager Plan of Care  Updates made by Lynne Logan, RN since 04/05/2022 12:00 AM     Problem: No Plan of Care Established for management of chronic disease states (DM, HTN, CKD III, Vitamin D deficiency)   Priority: High     Long-Range Goal: Establishment of plan of care for management of chronic disease states (DM, HTN, CKD III, Vitamin D deficiency)   Start Date: 11/04/2021  Expected End Date: 11/03/2022  Recent Progress: On track  Priority: High  Note:   Current Barriers:  Knowledge Deficits related to plan of care for management of DM, HTN, CKD III, Vitamin D deficiency   Chronic Disease Management support and education needs related to DM, HTN, CKD III, Vitamin D deficiency    RNCM Clinical Goal(s):  Patient will verbalize basic understanding of  DM, HTN, CKD III, Vitamin D deficiency  disease process and self health management plan as evidenced by patient will report having no disease exacerbations related to her chronic disease states as listed above  take all medications exactly as prescribed and will call provider for medication related questions as  evidenced by patient will report having no missed doses of her prescribed medications  demonstrate Improved health management independence as evidenced by patient will report 100% of her prescribed treatment plan  continue to work with RN Care Manager to address care management and care coordination needs related to  DM, HTN, CKD III, Vitamin D deficiency  as evidenced by adherence to CM Team Scheduled appointments demonstrate ongoing self health care management ability   as evidenced by    through collaboration with RN Care manager, provider, and care team.   Interventions: 1:1 collaboration with primary care provider regarding development and update of comprehensive plan of care as evidenced by provider attestation and co-signature Inter-disciplinary care team collaboration (see longitudinal plan of  care) Evaluation of current treatment plan related to  self management and patient's adherence to plan as established by provider   Chronic Kidney Disease Interventions:  (Status:  Condition stable.  Not addressed this visit.) Long Term Goal Assessed the Patient understanding of chronic kidney disease    Evaluation of current treatment plan related to chronic kidney disease self management and patient's adherence to plan as established by provider      Reviewed prescribed diet increase daily water intake to 64 oz   Advised patient, providing education and rationale, to monitor blood pressure daily and record, calling PCP for findings outside established parameters    Provided education on kidney disease progression    Discussed plans with patient for ongoing care management follow up and provided patient with direct contact information for care management team Last practice recorded BP readings:  BP Readings from Last 3 Encounters:  03/08/22 132/80  03/02/22 (!) 167/73  03/01/22 126/66  Most recent eGFR/CrCl:  Lab Results  Component Value Date   EGFR 52 (L) 03/01/2022    No components found for: CRCL   Diabetes Interventions:  (Status:  Condition stable.  Not addressed this visit.) Long Term Goal Assessed patient's understanding of A1c goal: <6.5% Provided education to patient about basic DM disease process Advised patient, providing education and rationale, to check cbg daily before breakfast and record, calling PCP for findings outside established parameters Review of patient status, including review of consultants reports, relevant laboratory and other test results, and medications completed Assessed social determinant of health barriers Educated patient how to use Meal Planning using the Plate Method, Educated on trying portion control to help limit carbohydrates Mailed printed educational materials related to Carb Counting, Carb Choice list; the [place]; [place] in  Skipping Meals; Meal Planning   Lab Results  Component Value Date   HGBA1C 6.9 (H) 03/01/2022  Impaired Urinary Elimination Interventions:  (Status:  Goal on track:  Yes.)  Long Term Goal Evaluation of current treatment plan related to  Impaired Urinary Elimination , self-management and patient's adherence to plan as established by provider Discussed patient's follow up with Urology, reviewed and discussed recommendations to start [place] patient does not like that this medication turns her urine a dark brown and has notified her Urologist that she may want to switch to a new medication Discussed patient wears incontinent hygiene such as depends and pad to help prevent from wetting her clothing, she feels this condition is interfering with ability to live a normal life Encouraged patient to notify her PCP provider and Urologist at next scheduled visit in order to seek additional treatment recommendations  Discussed plans with patient for ongoing care management follow up and provided patient with direct contact information for care management  team  Vitamin D deficiency Interventions:  (Status:  Condition stable.  Not addressed this visit.)  Long Term Goal Evaluation of current treatment plan related to  Vitamin D deficiency ,  self-management and patient's adherence to plan as established by provider Review of patient status, including review of consultant's reports, relevant laboratory and other test results, and medications completed. Reviewed medications with patient and discussed importance of medication adherence Educated patient on importance to eat a Vitamin D rich diet, try to get at least 15 minutes of natural sunlight when possible  Discussed plans with patient for ongoing care management follow up and provided patient with direct contact information for care management team Component Ref Range & Units 7 days ago   Vit D, 25-Hydroxy 30.0 - 100.0 ng/mL 62.6 normal       Hypertension Interventions:  (Status:  Goal on track:  Yes.) Long Term Goal Last practice recorded BP readings:  BP Readings from Last 3 Encounters:  03/21/22 (!) 148/84  03/08/22 132/80  03/02/22 (!) 167/73   BP Readings from Last 3 Encounters:  03/08/22 132/80  03/02/22 (!) 167/73  03/01/22 126/66  Most recent eGFR/CrCl:  Lab Results  Component Value Date   EGFR 52 (L) 03/01/2022    No components found for: "CRCL" Evaluation of current treatment plan related to hypertension self management and patient's adherence to plan as established by provider Reviewed medications with patient and discussed importance of compliance Counseled on the importance of exercise goals with target of 150 minutes per week Advised patient, providing education and rationale, to monitor blood pressure daily and record, calling PCP for findings outside established parameters Provided education on prescribed diet low Sodium  Discussed complications of poorly controlled blood pressure such as heart disease, stroke, circulatory complications, vision complications, kidney impairment, sexual dysfunction Screening for signs and symptoms of depression related to chronic disease state  Assessed social determinant of health barriers Instructed patient to monitor her BP at least 3 times weekly, record readings and take to all doctor appointments  Educated on importance to take all BP medications exactly as prescribed without missed doses for best results Instructed patient to notify PCP office for new concerns or questions or if having symptoms of hight blood pressure  Mailed printed education materials related to Why Should I Limit Sodium?; What is High Blood Pressure?   Acute upper respiratory symptoms including cough and swollen throat Interventions:  (Status:  Goal on track:  Yes.)  Short Term Goal Evaluation of current treatment plan related to  cough, swollen throat , self-management and patient's adherence to  plan as established by provider Determined patient continued to experience tongue pain and swelling, she was further evaluated by the ED  Discussed treatment recommendations, to use Biotene OTC as directed and follow up with ENT Determined patient began drinking lemon and ginger water prior to bedtime, after performing her nightly oral care and this has improved her symptoms  Instructed patient to call her PCP for new or persistent concerns related to this condition   Discussed plans with patient for ongoing care management follow up and provided patient with direct contact information for care management team   Patient Goals/Self-Care Activities: Take all medications as prescribed Attend all scheduled provider appointments Call pharmacy for medication refills 3-7 days in advance of running out of medications Attend church or other social activities Perform all self care activities independently  Perform IADL's (shopping, preparing meals, housekeeping, managing finances) independently Call provider office for new concerns or  questions  Work with the Education officer, museum to address care coordination needs and will continue to work with the clinical team to address health care and disease management related needs check feet daily for cuts, sores or redness drink 6 to 8 glasses of water each day fill half of plate with vegetables manage portion size Check your BP at least 3 times weekly, record your readings and take to all doctor appointments Take your BP medications exactly as prescribed Limit your Sodium intake as discussed   Follow Up Plan:  Telephone follow up appointment with care management team member scheduled for:  06/26/22     Barb Merino, RN, BSN, CCM Care Management Coordinator [place] [facility]  Direct Phone: [phone]

## 2022-04-06 NOTE — Patient Instructions (Signed)
Visit Information  Thank you for taking time to visit with me today. Please don't hesitate to contact me if I can be of assistance to you before our next scheduled telephone appointment.  Following are the goals we discussed today:  (Copy and paste patient goals from clinical care plan here)  Our next appointment is by telephone on 06/26/22 at 11:15 AM   Please call the care guide team at [phone] if you need to cancel or reschedule your appointment.   If you are experiencing a Mental Health or [place] or need someone to talk to, please call 1-800-273-TALK (toll free, 24 hour hotline)   Patient verbalizes understanding of instructions and care plan provided today and agrees to view in [place]. Active MyChart status and patient understanding of how to access instructions and care plan via MyChart confirmed with patient.     Barb Merino, RN, BSN, CCM Care Management Coordinator [place] [facility]  Direct Phone: [phone]

## 2022-04-17 ENCOUNTER — Observation Stay (HOSPITAL_BASED_OUTPATIENT_CLINIC_OR_DEPARTMENT_OTHER)
Admission: EM | Admit: 2022-04-17 | Discharge: 2022-04-20 | Disposition: A | Discharge status: Home / Self Care | Payer: Medicare Other | Attending: Family Medicine | Admitting: Family Medicine

## 2022-04-17 ENCOUNTER — Encounter (HOSPITAL_BASED_OUTPATIENT_CLINIC_OR_DEPARTMENT_OTHER): Payer: Self-pay | Admitting: Emergency Medicine

## 2022-04-17 ENCOUNTER — Emergency Department (HOSPITAL_COMMUNITY): Payer: Medicare Other

## 2022-04-17 ENCOUNTER — Other Ambulatory Visit: Payer: Self-pay

## 2022-04-17 DIAGNOSIS — R2990 Unspecified symptoms and signs involving the nervous system: Secondary | ICD-10-CM

## 2022-04-17 DIAGNOSIS — R4701 Aphasia: Secondary | ICD-10-CM

## 2022-04-17 DIAGNOSIS — I6529 Occlusion and stenosis of unspecified carotid artery: Secondary | ICD-10-CM | POA: Diagnosis not present

## 2022-04-17 DIAGNOSIS — I152 Hypertension secondary to endocrine disorders: Secondary | ICD-10-CM | POA: Diagnosis present

## 2022-04-17 DIAGNOSIS — Z96653 Presence of artificial knee joint, bilateral: Secondary | ICD-10-CM | POA: Insufficient documentation

## 2022-04-17 DIAGNOSIS — Z79899 Other long term (current) drug therapy: Secondary | ICD-10-CM | POA: Insufficient documentation

## 2022-04-17 DIAGNOSIS — E1122 Type 2 diabetes mellitus with diabetic chronic kidney disease: Secondary | ICD-10-CM | POA: Diagnosis not present

## 2022-04-17 DIAGNOSIS — N183 Chronic kidney disease, stage 3 unspecified: Secondary | ICD-10-CM | POA: Diagnosis present

## 2022-04-17 DIAGNOSIS — I10 Essential (primary) hypertension: Secondary | ICD-10-CM | POA: Diagnosis present

## 2022-04-17 DIAGNOSIS — N1831 Chronic kidney disease, stage 3a: Secondary | ICD-10-CM | POA: Diagnosis present

## 2022-04-17 DIAGNOSIS — R29818 Other symptoms and signs involving the nervous system: Secondary | ICD-10-CM | POA: Diagnosis not present

## 2022-04-17 DIAGNOSIS — R479 Unspecified speech disturbances: Principal | ICD-10-CM | POA: Diagnosis present

## 2022-04-17 DIAGNOSIS — I129 Hypertensive chronic kidney disease with stage 1 through stage 4 chronic kidney disease, or unspecified chronic kidney disease: Secondary | ICD-10-CM | POA: Insufficient documentation

## 2022-04-17 DIAGNOSIS — I639 Cerebral infarction, unspecified: Secondary | ICD-10-CM | POA: Diagnosis not present

## 2022-04-17 DIAGNOSIS — Z7982 Long term (current) use of aspirin: Secondary | ICD-10-CM | POA: Insufficient documentation

## 2022-04-17 DIAGNOSIS — G4733 Obstructive sleep apnea (adult) (pediatric): Secondary | ICD-10-CM | POA: Insufficient documentation

## 2022-04-17 DIAGNOSIS — I672 Cerebral atherosclerosis: Secondary | ICD-10-CM | POA: Diagnosis not present

## 2022-04-17 DIAGNOSIS — E1169 Type 2 diabetes mellitus with other specified complication: Secondary | ICD-10-CM | POA: Diagnosis present

## 2022-04-17 LAB — DIFFERENTIAL
Abs Immature Granulocytes: 0.03 10*3/uL (ref 0.00–0.07)
Basophils Absolute: 0.1 10*3/uL (ref 0.0–0.1)
Basophils Relative: 1 %
Eosinophils Absolute: 0.3 10*3/uL (ref 0.0–0.5)
Eosinophils Relative: 3 %
Immature Granulocytes: 0 %
Lymphocytes Relative: 26 %
Lymphs Abs: 2.7 10*3/uL (ref 0.7–4.0)
Monocytes Absolute: 0.8 10*3/uL (ref 0.1–1.0)
Monocytes Relative: 7 %
Neutro Abs: 6.3 10*3/uL (ref 1.7–7.7)
Neutrophils Relative %: 63 %

## 2022-04-17 LAB — COMPREHENSIVE METABOLIC PANEL
ALT: 25 U/L (ref 0–44)
AST: 28 U/L (ref 15–41)
Albumin: 4.2 g/dL (ref 3.5–5.0)
Alkaline Phosphatase: 36 U/L — ABNORMAL LOW (ref 38–126)
Anion gap: 10 (ref 5–15)
BUN: 23 mg/dL (ref 8–23)
CO2: 27 mmol/L (ref 22–32)
Calcium: 10.2 mg/dL (ref 8.9–10.3)
Chloride: 103 mmol/L (ref 98–111)
Creatinine, Ser: 1.11 mg/dL — ABNORMAL HIGH (ref 0.44–1.00)
GFR, Estimated: 51 mL/min — ABNORMAL LOW (ref >60)
Glucose, Bld: 109 mg/dL — ABNORMAL HIGH (ref 70–99)
Potassium: 3.9 mmol/L (ref 3.5–5.1)
Sodium: 140 mmol/L (ref 135–145)
Total Bilirubin: 1.1 mg/dL (ref 0.3–1.2)
Total Protein: 7.5 g/dL (ref 6.5–8.1)

## 2022-04-17 LAB — CBC
HCT: 42.2 % (ref 36.0–46.0)
Hemoglobin: 14.1 g/dL (ref 12.0–15.0)
MCH: 30.1 pg (ref 26.0–34.0)
MCHC: 33.4 g/dL (ref 30.0–36.0)
MCV: 90 fL (ref 80.0–100.0)
Platelets: 214 10*3/uL (ref 150–400)
RBC: 4.69 MIL/uL (ref 3.87–5.11)
RDW: 13.9 % (ref 11.5–15.5)
WBC: 10.1 10*3/uL (ref 4.0–10.5)
nRBC: 0 % (ref 0.0–0.2)

## 2022-04-17 LAB — PROTIME-INR
INR: 1 (ref 0.8–1.2)
Prothrombin Time: 12.8 seconds (ref 11.4–15.2)

## 2022-04-17 LAB — ETHANOL: Alcohol, Ethyl (B): 10 mg/dL — ABNORMAL HIGH (ref <10)

## 2022-04-17 LAB — APTT: aPTT: 31 seconds (ref 24–36)

## 2022-04-17 IMAGING — CT CT HEAD W/O CM
4 series · 16 of 47 positions shown, 18 images · non-contrast
Comparison: CT head [DATE]

CLINICAL DATA: Neuro deficit, acute, stroke suspected. Pt arrives
to ED with c/o aphasia. Pt reports that yesterday at 4pm pt
experienced a 10 minute episode of not being able to speak. She
reports she is back at her baseline.



[Series 3: head without · axial · non-contrast · 0.44mm/px · z∈[-566,-446]mm · 7 of 32 slices shown, 9 images]
[im 4/32  brain]
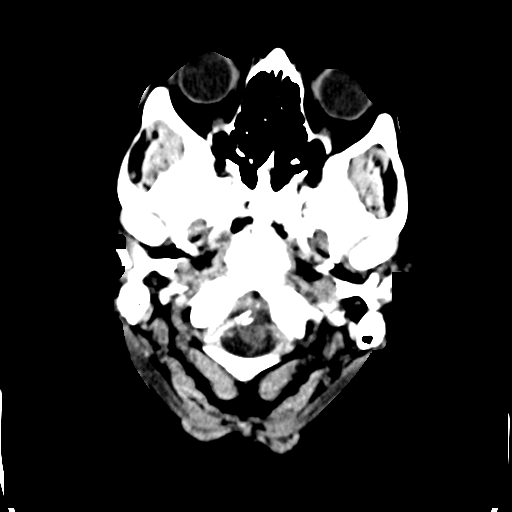
[im 4/32  bone]
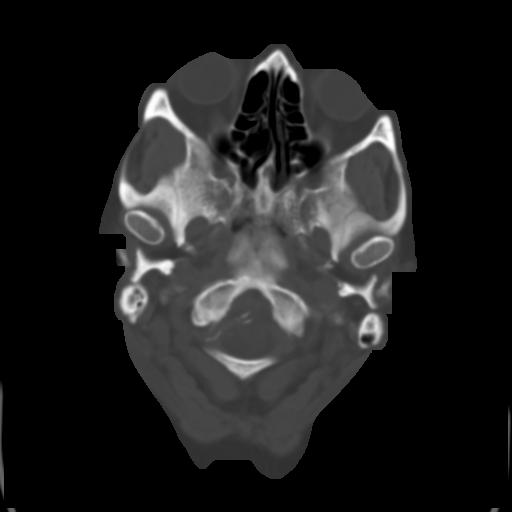
[im 8/32  brain]
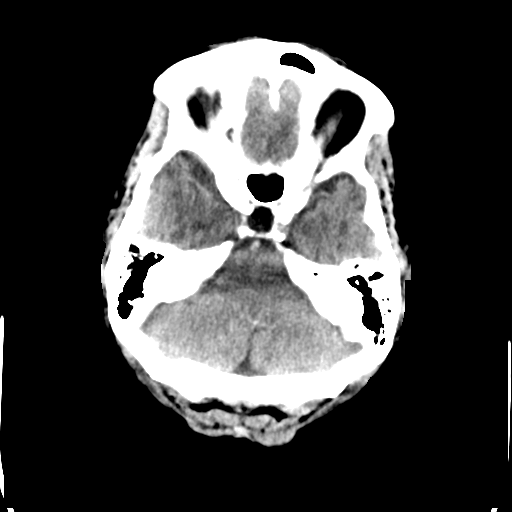
[im 12/32  brain]
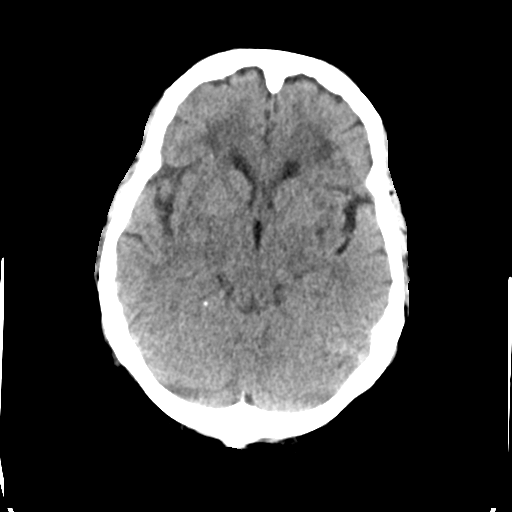
[im 16/32  brain]
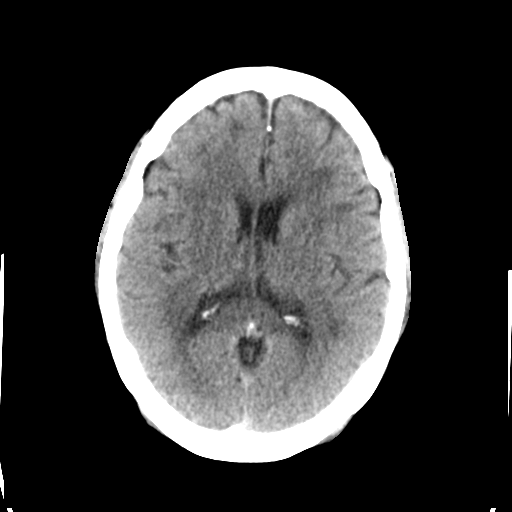
[im 20/32  brain]
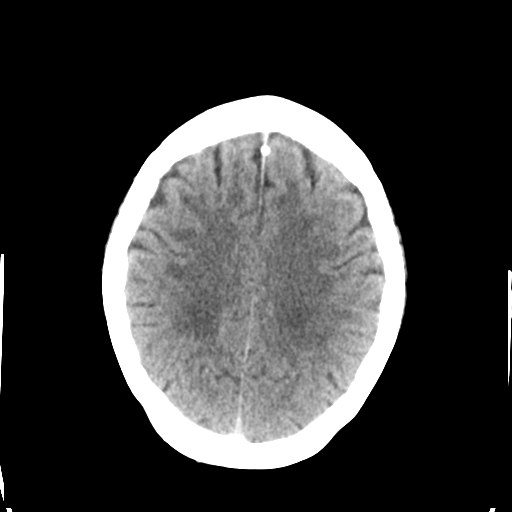
[im 20/32  bone]
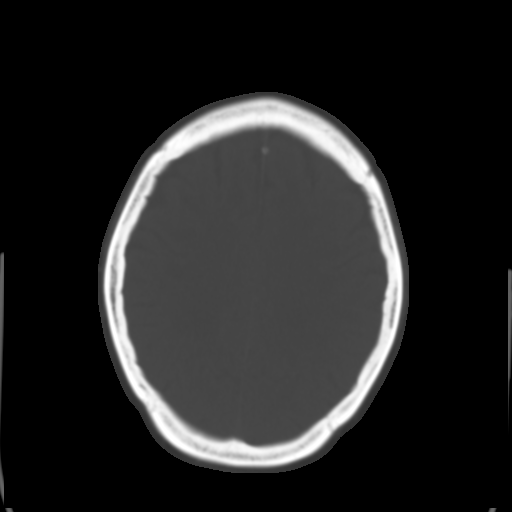
[im 24/32  brain]
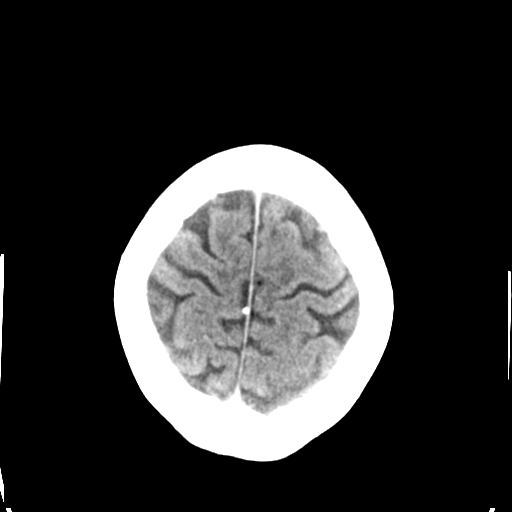
[im 28/32  brain]
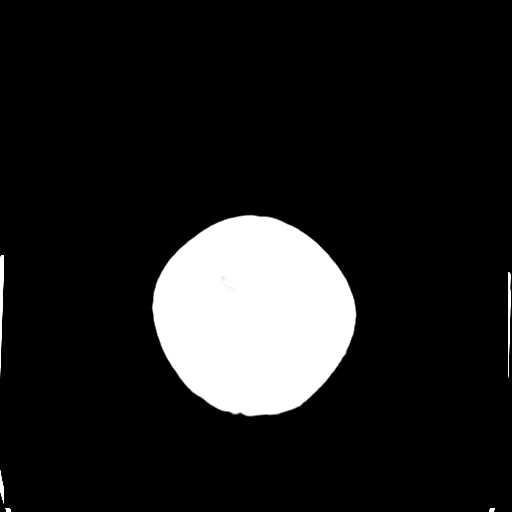

[Series 4: head bone · axial · 0.44mm/px · z∈[-567,-535]mm · 3 of 79 slices shown]
[im 8/79  bone]
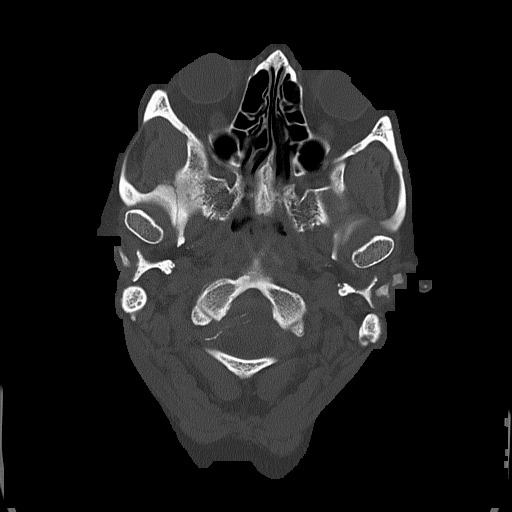
[im 16/79  bone]
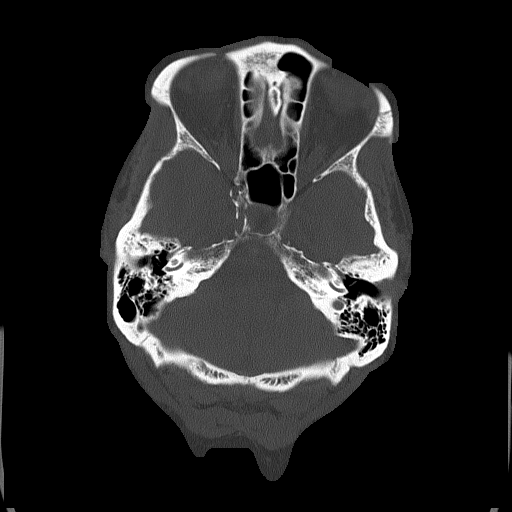
[im 24/79  bone]
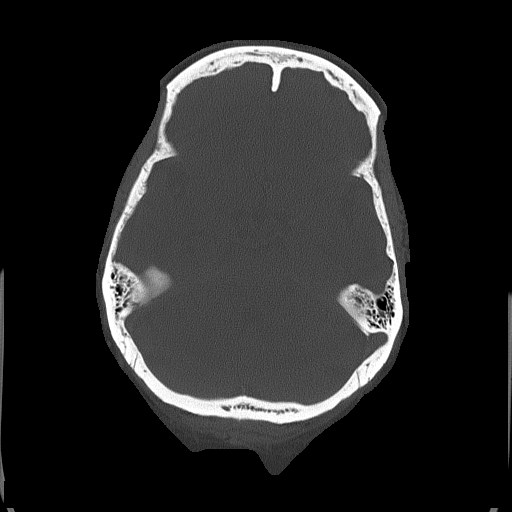

[Series 5: head without cor · coronal · non-contrast · 0.33mm/px · 3 of 72 slices shown]
[im 24/72  brain]
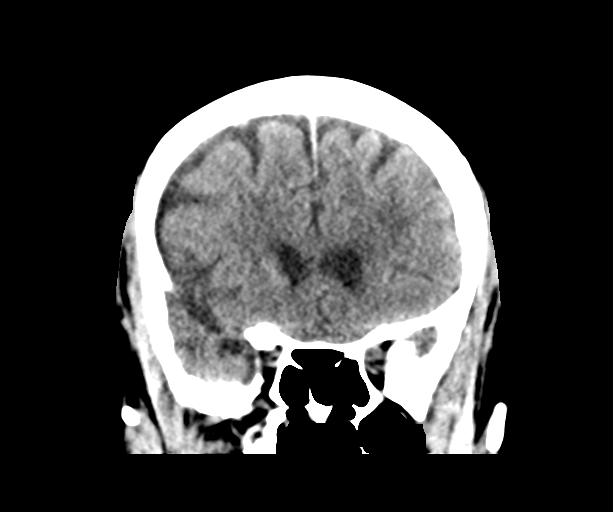
[im 32/72  brain]
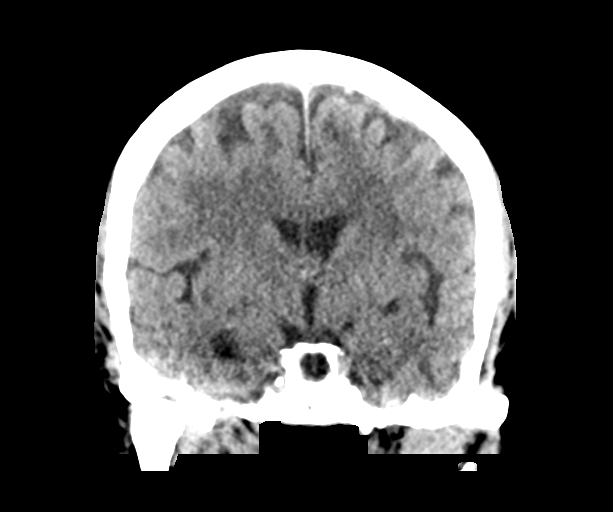
[im 40/72  brain]
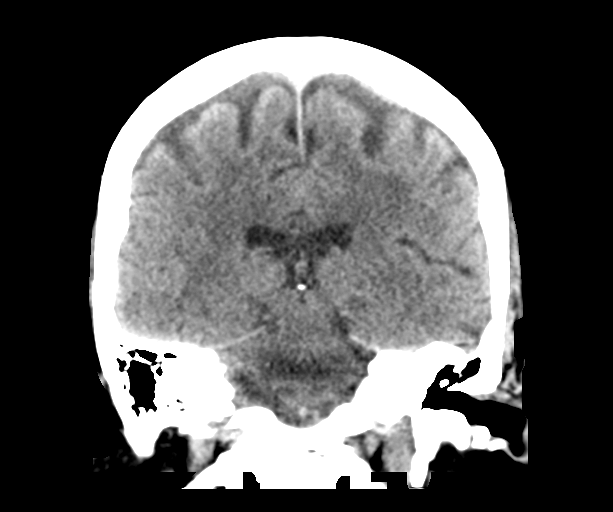

[Series 6: head without sag · sagittal · non-contrast · 0.34mm/px · 3 of 67 slices shown]
[im 23/67  brain]
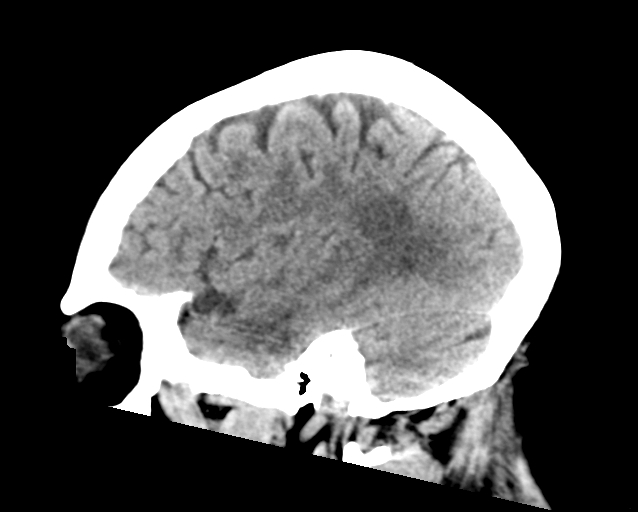
[im 34/67  brain]
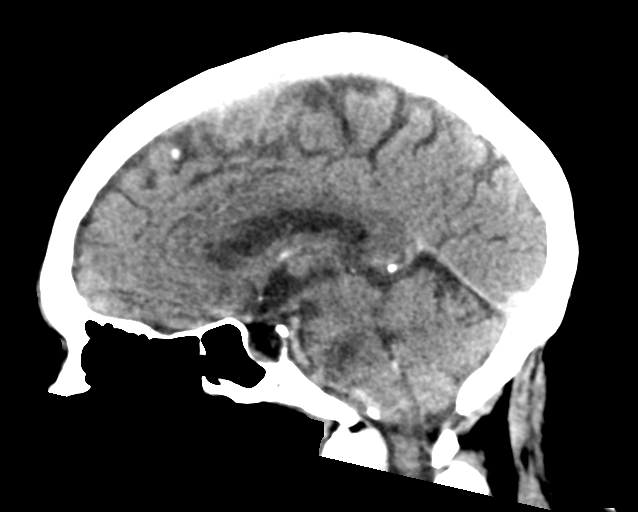
[im 45/67  brain]
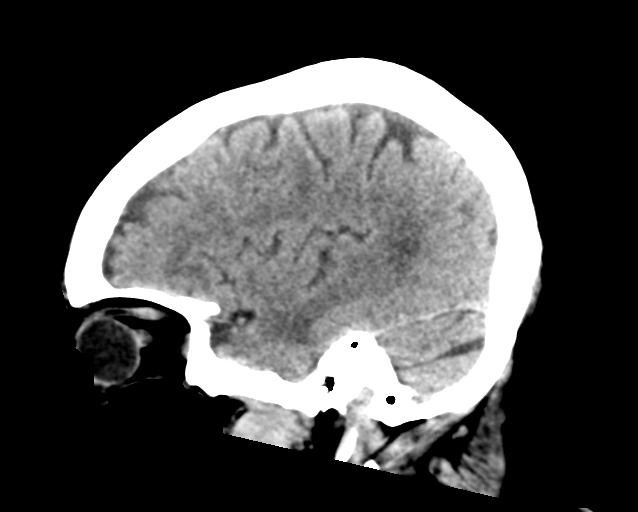

[16 of 47 positions shown; findings below may reference images not displayed]

BRAIN:
BRAIN
Patchy and confluent areas of decreased attenuation are noted
throughout the deep and periventricular white matter of the cerebral
hemispheres bilaterally, compatible with chronic microvascular
ischemic disease.

No evidence of large-territorial acute infarction. No parenchymal
hemorrhage. No mass lesion. No extra-axial collection.

No mass effect or midline shift. No hydrocephalus. Basilar cisterns
are patent.

Empty sella.

Vascular: No hyperdense vessel. Atherosclerotic calcifications are
present within the cavernous internal carotid and vertebral
arteries.

Skull: No acute fracture or focal lesion.

Sinuses/Orbits: Paranasal sinuses and mastoid air cells are clear.
Bilateral lens replacement. Otherwise the orbits are unremarkable.

Other: None.
IMPRESSION: 1.  No acute intracranial abnormality.
2. Empty sella. Findings is often a normal anatomic variant but can
be associated with idiopathic intracranial hypertension (pseudotumor
cerebri).

## 2022-04-17 MED ORDER — IRBESARTAN 75 MG PO TABS
75.0000 mg | ORAL_TABLET | Freq: Every day | ORAL | Status: DC
Start: 2022-04-17 — End: 2022-04-18
  Filled 2022-04-17: qty 1

## 2022-04-17 NOTE — ED Notes (Signed)
Patient transported to MRI 

## 2022-04-17 NOTE — ED Provider Notes (Signed)
[facility] EMERGENCY DEPARTMENT Provider Note   CSN: [id number] Arrival date & time: 04/17/22  1227     History  Chief Complaint  Patient presents with   Aphasia    [name] is a 81 y.o. female.  Patient presents ER chief complaint of difficulty getting her words out approximately 4 PM.  She states she was last well at 4 PM.  Symptoms lasted about 10 minutes with her husband at her side he could not understand what she was saying.  Symptoms have since resolved and she is feeling fine.  Denies any new numbness weakness otherwise no fever no cough no vomiting or diarrhea no headache no chest pain no abdominal pain.       Home Medications Prior to Admission medications   Medication Sig Start Date End Date Taking? Authorizing Provider  allopurinol (ZYLOPRIM) 300 MG tablet Take 1 tablet by mouth daily 02/02/22   [name], MD  amoxicillin (AMOXIL) 500 MG capsule Take 1 capsule (500 mg total) by mouth 3 (three) times daily. 03/08/22   [name], MD  aspirin EC 81 MG tablet Take 1 tablet (81 mg total) by mouth daily. 02/02/22   [name], MD  atenolol-chlorthalidone (TENORETIC) 50-25 MG tablet Take 1 tablet by mouth daily. 11/07/21   [name], MD  benzonatate (TESSALON PERLES) 100 MG capsule Take 1 capsule (100 mg total) by mouth 3 (three) times daily as needed for cough. 03/01/22 03/01/23  [name], FNP  Blood Pressure KIT 1 Units by Does not apply route daily. Check blood pressure daily for hypertension dx code I10 09/14/21   Skeet Latch, MD  diclofenac Sodium (VOLTAREN) 1 % GEL Apply 4 g topically 4 (four) times daily. Apply to back 11/26/21   Palumbo, April, MD  Dulaglutide (TRULICITY) 4.01 UU/7.2ZD SOPN Inject 0.5 mg into the skin every 14 (fourteen) days.    [provider]  hydrocortisone cream 1 % Apply to affected area 2 times daily 03/01/22 03/01/23  [name], FNP  Lancets [facility] DELICA PLUS [id number]) [place] USE TO CHECK  BLOOD SUGAR TWICE DAILY 12/24/20   [name], MD  lidocaine (LIDODERM) 5 % Place 1 patch onto the skin daily. Remove & Discard patch within 12 hours or as directed by MD 11/26/21   Randal Buba, April, MD  Magnesium 250 MG TABS Take 1 tablet (250 mg total) by mouth every evening. Take with dinner meal 08/18/21   [name], MD  Multiple Vitamin (MULTIVITAMIN) tablet Take 1 tablet by mouth daily. 08/18/21   [name], MD  Multiple Vitamins-Minerals (ZINC PO) Take 1 tablet by mouth as needed.    [provider]  [facility] VERIO test strip USE TO TEST 2 TIMES DAILY 12/20/20   [name], MD  rosuvastatin (CRESTOR) 20 MG tablet TAKE 1 TABLET(20 MG) BY MOUTH DAILY 02/02/22   [name], MD  Saccharomyces boulardii (PROBIOTIC) 250 MG CAPS 1 tablet by mouth daily 08/18/21   [name], MD  solifenacin (VESICARE) 5 MG tablet Take 5 mg by mouth every morning. 02/09/22   [provider]  tiZANidine (ZANAFLEX) 4 MG tablet Take 0.5 tablets (2 mg total) by mouth every 6 (six) hours as needed for muscle spasms. 11/27/21   Luna Fuse, MD  valsartan (DIOVAN) 80 MG tablet Take 1 tablet (80 mg total) by mouth daily. 09/14/21   Skeet Latch, MD  vitamin B-12 (CYANOCOBALAMIN) 100 MCG tablet Take 1 tablet by mouth daily 08/18/21   [name],  MD  Vitamin D, Ergocalciferol, (DRISDOL) 1.25 MG (50000 UNIT) CAPS capsule TAKE ONE CAPSULE BY MOUTH ONCE WEEKLY BEFORE BREAKFAST ON  TUESDAYS and TAKE ONE CAPSULE BY MOUTH ONCE WEEKLY BEFORE BREAKFAST ON FRIDAYS 03/08/22   [name], MD      Allergies    Atorvastatin, Atorvastatin calcium, Bee venom, Hydrocodone-acetaminophen, Oxycodone, and Vicodin [hydrocodone-acetaminophen]    Review of Systems   Review of Systems  Constitutional:  Negative for fever.  HENT:  Negative for ear pain.   Eyes:  Negative for pain.  Respiratory:  Negative for cough.   Cardiovascular:  Negative for chest pain.  Gastrointestinal:  Negative for  abdominal pain.  Genitourinary:  Negative for flank pain.  Musculoskeletal:  Negative for back pain.  Skin:  Negative for rash.  Neurological:  Negative for headaches.    Physical Exam Updated Vital Signs BP (!) 146/68   Pulse 61   Temp 98 F (36.7 C) (Oral)   Resp 16   Ht '5\' 6"'  (1.676 m)   Wt 104.3 kg   SpO2 95%   BMI 37.12 kg/m  Physical Exam Constitutional:      General: She is not in acute distress.    Appearance: Normal appearance.  HENT:     Head: Normocephalic.     Nose: Nose normal.  Eyes:     Extraocular Movements: Extraocular movements intact.  Cardiovascular:     Rate and Rhythm: Normal rate.  Pulmonary:     Effort: Pulmonary effort is normal.  Musculoskeletal:        General: Normal range of motion.     Cervical back: Normal range of motion.  Neurological:     General: No focal deficit present.     Mental Status: She is alert. Mental status is at baseline.     Cranial Nerves: No cranial nerve deficit.     Motor: No weakness.     ED Results / Procedures / Treatments   Labs (all labs ordered are listed, but only abnormal results are displayed) Labs Reviewed  COMPREHENSIVE METABOLIC PANEL - Abnormal; Notable for the following components:      Result Value   Glucose, Bld 109 (*)    Creatinine, Ser 1.11 (*)    Alkaline Phosphatase 36 (*)    GFR, Estimated 51 (*)    All other components within normal limits  ETHANOL - Abnormal; Notable for the following components:   Alcohol, Ethyl (B) 10 (*)    All other components within normal limits  PROTIME-INR  APTT  CBC  DIFFERENTIAL  CBG MONITORING, ED    EKG EKG Interpretation  Date/Time:  Monday April 17 2022 12:53:52 EDT Ventricular Rate:  55 PR Interval:  171 QRS Duration: 159 QT Interval:  472 QTC Calculation: 452 R Axis:   -11 Text Interpretation: Sinus rhythm Left bundle branch block Confirmed by Campbell Stall ([id number]) on 1/79/1505 4:02:41 PM  Radiology MR BRAIN WO CONTRAST  Result Date:  04/17/2022 CLINICAL DATA:  Stroke suspected, difficulty speaking resolved EXAM: MRI HEAD WITHOUT CONTRAST TECHNIQUE: Multiplanar, multiecho pulse sequences of the brain and surrounding structures were obtained without intravenous contrast. COMPARISON:  No prior MRI, correlation is made with CT head 04/17/2022 FINDINGS: Brain: No restricted diffusion to suggest acute or subacute infarct. No acute hemorrhage, mass mass effect, or midline shift. No hydrocephalus or extra-axial collection. Partial empty sella. Minimally low lying cerebellar tonsils, which extend 1-2 mm below the foramen magnum, which does not qualify as cerebellar ectopia or Chiari malformation. 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 Scattered and confluent T2 hyperintense signal in the periventricular white matter, likely the sequela of moderate chronic small vessel ischemic disease. Vascular: Normal arterial flow voids. Skull and upper cervical spine: Normal marrow signal. Sinuses/Orbits: No acute finding. Status post bilateral lens replacements. Other: The mastoids are well aerated. IMPRESSION: No acute intracranial process. No evidence of acute or subacute infarct. Electronically Signed   By: Merilyn Baba M.D.   On: 04/17/2022 21:34   CT HEAD WO CONTRAST  Result Date: 04/17/2022 CLINICAL DATA:  Neuro deficit, acute, stroke suspected. Pt arrives to ED with c/o aphasia. Pt reports that yesterday at 4pm pt experienced a 10 minute episode of not being able to speak. She reports she is back at her baseline. EXAM: CT HEAD WITHOUT CONTRAST TECHNIQUE: Contiguous axial images were obtained from the base of the skull through the vertex without intravenous contrast. RADIATION DOSE REDUCTION: This exam was performed according to the departmental dose-optimization program which includes automated exposure control, adjustment of the mA and/or  kV according to patient size and/or use of iterative reconstruction technique. COMPARISON:  CT head 03/02/2022 BRAIN: BRAIN Patchy and confluent areas of decreased attenuation are noted throughout the deep and periventricular white matter of the cerebral hemispheres bilaterally, compatible with chronic microvascular ischemic disease. No evidence of large-territorial acute infarction. No parenchymal hemorrhage. No mass lesion. No extra-axial collection. No mass effect or midline shift. No hydrocephalus. Basilar cisterns are patent. Empty sella. Vascular: No hyperdense vessel. Atherosclerotic calcifications are present within the cavernous internal carotid and vertebral arteries. Skull: No acute fracture or focal lesion. Sinuses/Orbits: Paranasal sinuses and mastoid air cells are clear. Bilateral lens replacement. Otherwise the orbits are unremarkable. Other: None. IMPRESSION: 1.  No acute intracranial abnormality. 2. Empty sella. Findings is often a normal anatomic variant but can be associated with idiopathic intracranial hypertension (pseudotumor cerebri). Electronically Signed   By: Iven Finn M.D.   On: 04/17/2022 18:25    Procedures Procedures    Medications Ordered in ED Medications  irbesartan (AVAPRO) tablet 75 mg (75 mg Oral Not Given 04/17/22 1609)    ED Course/ Medical Decision Making/ A&P                           Medical Decision Making Amount and/or Complexity of Data Reviewed Labs: ordered. Radiology: ordered.  Risk Prescription drug management.   Review of records shows outside facility earlier today for strokelike symptoms and transferred here.  Cardiac monitoring showing sinus rhythm.  History from family at bedside.  Diagnostic studies were sent and labs unremarkable chemistry CBC normal white count normal.  MRI of the brain shows no acute findings.  Patient remains asymptomatic at this time.  Consultation with neurology,        Final Clinical  Impression(s) / ED Diagnoses Final diagnoses:  Stroke-like symptom  Expressive aphasia    Rx / DC Orders ED Discharge Orders     None         Luna Fuse, MD 04/17/22 2327

## 2022-04-17 NOTE — ED Provider Notes (Signed)
[place] EMERGENCY DEPT Provider Note   CSN: [id number] Arrival date & time: 04/17/22  1227     History  Chief Complaint  Patient presents with   Aphasia    [name] is a 81 y.o. female.  Patient is a 81 year old female with past medical history of hypertension, hyperlipidemia, and diabetes presenting for difficulty speaking occurred yesterday at approximately 3/4 PM and lasted 10 minutes prior to complete resolution.  Patient denies any facial droop, slurred speech, station, or motor deficits.  States " just could not get my words out".  Presented this morning because she felt that she needed to be checked out.  No repeat symptoms.  No prior history of a stroke.  On aspirin daily.  The history is provided by the patient. No language interpreter was used.       Home Medications Prior to Admission medications   Medication Sig Start Date End Date Taking? Authorizing Provider  allopurinol (ZYLOPRIM) 300 MG tablet Take 1 tablet by mouth daily 02/02/22   [name], MD  amoxicillin (AMOXIL) 500 MG capsule Take 1 capsule (500 mg total) by mouth 3 (three) times daily. 03/08/22   [name], MD  aspirin EC 81 MG tablet Take 1 tablet (81 mg total) by mouth daily. 02/02/22   [name], MD  atenolol-chlorthalidone (TENORETIC) 50-25 MG tablet Take 1 tablet by mouth daily. 11/07/21   [name], MD  benzonatate (TESSALON PERLES) 100 MG capsule Take 1 capsule (100 mg total) by mouth 3 (three) times daily as needed for cough. 03/01/22 03/01/23  [name], FNP  Blood Pressure KIT 1 Units by Does not apply route daily. Check blood pressure daily for hypertension dx code I10 09/14/21   Skeet Latch, MD  diclofenac Sodium (VOLTAREN) 1 % GEL Apply 4 g topically 4 (four) times daily. Apply to back 11/26/21   Palumbo, April, MD  Dulaglutide (TRULICITY) 8.75 IE/3.3IR SOPN Inject 0.5 mg into the skin every 14 (fourteen) days.    [provider]   hydrocortisone cream 1 % Apply to affected area 2 times daily 03/01/22 03/01/23  [name], FNP  Lancets [facility] DELICA PLUS [id number]) [place] USE TO CHECK BLOOD SUGAR TWICE DAILY 12/24/20   [name], MD  lidocaine (LIDODERM) 5 % Place 1 patch onto the skin daily. Remove & Discard patch within 12 hours or as directed by MD 11/26/21   Randal Buba, April, MD  Magnesium 250 MG TABS Take 1 tablet (250 mg total) by mouth every evening. Take with dinner meal 08/18/21   [name], MD  Multiple Vitamin (MULTIVITAMIN) tablet Take 1 tablet by mouth daily. 08/18/21   [name], MD  Multiple Vitamins-Minerals (ZINC PO) Take 1 tablet by mouth as needed.    [provider]  [facility] VERIO test strip USE TO TEST 2 TIMES DAILY 12/20/20   [name], MD  rosuvastatin (CRESTOR) 20 MG tablet TAKE 1 TABLET(20 MG) BY MOUTH DAILY 02/02/22   [name], MD  Saccharomyces boulardii (PROBIOTIC) 250 MG CAPS 1 tablet by mouth daily 08/18/21   [name], MD  solifenacin (VESICARE) 5 MG tablet Take 5 mg by mouth every morning. 02/09/22   [provider]  tiZANidine (ZANAFLEX) 4 MG tablet Take 0.5 tablets (2 mg total) by mouth every 6 (six) hours as needed for muscle spasms. 11/27/21   Luna Fuse, MD  valsartan (DIOVAN) 80 MG tablet Take 1 tablet (80 mg total) by mouth daily. 09/14/21   Skeet Latch, MD  vitamin B-12 (CYANOCOBALAMIN) 100 MCG tablet Take 1 tablet by mouth daily 08/18/21   [name], MD  Vitamin D, Ergocalciferol, (DRISDOL) 1.25 MG (50000 UNIT) CAPS capsule TAKE ONE CAPSULE BY MOUTH ONCE WEEKLY BEFORE BREAKFAST ON  TUESDAYS and TAKE ONE CAPSULE BY MOUTH ONCE WEEKLY BEFORE BREAKFAST ON FRIDAYS 03/08/22   [name], MD      Allergies    Atorvastatin, Atorvastatin calcium, Bee venom, Hydrocodone-acetaminophen, Oxycodone, and Vicodin [hydrocodone-acetaminophen]    Review of Systems   Review of Systems  Constitutional:  Negative for chills and fever.  HENT:   Negative for ear pain and sore throat.   Eyes:  Negative for pain and visual disturbance.  Respiratory:  Negative for cough and shortness of breath.   Cardiovascular:  Negative for chest pain and palpitations.  Gastrointestinal:  Negative for abdominal pain and vomiting.  Genitourinary:  Negative for dysuria and hematuria.  Musculoskeletal:  Negative for arthralgias and back pain.  Skin:  Negative for color change and rash.  Neurological:  Positive for speech difficulty. Negative for seizures and syncope.  All other systems reviewed and are negative.   Physical Exam Updated Vital Signs BP (!) 189/69   Pulse (!) 54   Temp 98 F (36.7 C)   Resp 17   Ht '5\' 6"'  (1.676 m)   Wt 104.3 kg   SpO2 100%   BMI 37.12 kg/m  Physical Exam Vitals and nursing note reviewed.  Constitutional:      General: She is not in acute distress.    Appearance: She is well-developed.  HENT:     Head: Normocephalic and atraumatic.  Eyes:     General: Lids are normal. Vision grossly intact.     Conjunctiva/sclera: Conjunctivae normal.     Pupils: Pupils are equal, round, and reactive to light.  Cardiovascular:     Rate and Rhythm: Normal rate and regular rhythm.     Heart sounds: No murmur heard. Pulmonary:     Effort: Pulmonary effort is normal. No respiratory distress.     Breath sounds: Normal breath sounds.  Abdominal:     Palpations: Abdomen is soft.     Tenderness: There is no abdominal tenderness.  Musculoskeletal:        General: No swelling.     Cervical back: Neck supple.  Skin:    General: Skin is warm and dry.     Capillary Refill: Capillary refill takes less than 2 seconds.  Neurological:     General: No focal deficit present.     Mental Status: She is alert and oriented to person, place, and time.     GCS: GCS eye subscore is 4. GCS verbal subscore is 5. GCS motor subscore is 6.     Cranial Nerves: Cranial nerves 2-12 are intact.     Sensory: Sensation is intact.     Motor: Motor  function is intact.     Coordination: Coordination is intact.  Psychiatric:        Mood and Affect: Mood normal.     ED Results / Procedures / Treatments   Labs (all labs ordered are listed, but only abnormal results are displayed) Labs Reviewed  COMPREHENSIVE METABOLIC PANEL - Abnormal; Notable for the following components:      Result Value   Glucose, Bld 109 (*)    Creatinine, Ser 1.11 (*)    Alkaline Phosphatase 36 (*)    GFR, Estimated 51 (*)    All other components within normal limits  ETHANOL - Abnormal; Notable for the following components:   Alcohol, Ethyl (B) 10 (*)    All other components within normal limits  PROTIME-INR  APTT  CBC  DIFFERENTIAL  CBG MONITORING, ED    EKG None  Radiology No results found.  Procedures Procedures    Medications Ordered in ED Medications  irbesartan (AVAPRO) tablet 75 mg (has no administration in time range)    ED Course/ Medical Decision Making/ A&P                           Medical Decision Making Amount and/or Complexity of Data Reviewed Labs: ordered. Radiology: ordered.  Risk Prescription drug management.   9:75 PM 81 year old female with past medical history of hypertension, hyperlipidemia, and diabetes presenting for difficulty speaking occurred yesterday at approximately 3/4 PM and lasted 10 minutes prior to complete resolution.  Patient is alert and oriented x3, no acute distress, afebrile, stable vital signs.  No neurovascular deficits on exam.  Stroke scale 0. CHADS2 Score: 5  Patient recommended for MRI with discharge home if no acute process.  Patient accepted for ED to ED transfer for MRI by ED physician Dr. Wyvonnia Dusky. Commended to continue taking daily aspirin.  Patient recommended for close follow-up with primary care physician in the next 3 to 5 days and to return to emergency department if symptoms recur.         Final Clinical Impression(s) / ED Diagnoses Final diagnoses:  Stroke-like  symptom  Expressive aphasia    Rx / DC Orders ED Discharge Orders     None         Lianne Cure, DO 39/58/44 1601

## 2022-04-17 NOTE — ED Notes (Signed)
Report called to Charge RN

## 2022-04-17 NOTE — ED Notes (Signed)
ED Provider at bedside. 

## 2022-04-17 NOTE — ED Triage Notes (Signed)
Pt arrives to ED with c/o aphasia. Pt reports that yesterday at 4pm pt experienced a 10 minute episode of not being able to speak. She reports she is back at her baseline.

## 2022-04-17 NOTE — ED Notes (Signed)
Report from [place] has episode of 10 mins of aphasia yesterday and it resolved on its own.  Decided to get seen today.  NO complaints this time.  Arrived for CT and MRI

## 2022-04-17 NOTE — ED Notes (Signed)
Pt did not want statin prescribed due to the fat that it was not what she usually takes. Pt requested regular blood pressure medication, MD notified

## 2022-04-18 ENCOUNTER — Observation Stay (HOSPITAL_BASED_OUTPATIENT_CLINIC_OR_DEPARTMENT_OTHER): Payer: Medicare Other

## 2022-04-18 ENCOUNTER — Observation Stay (HOSPITAL_COMMUNITY): Payer: Medicare Other

## 2022-04-18 ENCOUNTER — Encounter (HOSPITAL_COMMUNITY): Payer: Self-pay | Admitting: Family Medicine

## 2022-04-18 DIAGNOSIS — R4701 Aphasia: Secondary | ICD-10-CM | POA: Insufficient documentation

## 2022-04-18 DIAGNOSIS — N1831 Chronic kidney disease, stage 3a: Secondary | ICD-10-CM | POA: Diagnosis not present

## 2022-04-18 DIAGNOSIS — I10 Essential (primary) hypertension: Secondary | ICD-10-CM

## 2022-04-18 DIAGNOSIS — M47812 Spondylosis without myelopathy or radiculopathy, cervical region: Secondary | ICD-10-CM | POA: Diagnosis not present

## 2022-04-18 DIAGNOSIS — R479 Unspecified speech disturbances: Secondary | ICD-10-CM | POA: Diagnosis not present

## 2022-04-18 DIAGNOSIS — E1122 Type 2 diabetes mellitus with diabetic chronic kidney disease: Secondary | ICD-10-CM | POA: Diagnosis not present

## 2022-04-18 DIAGNOSIS — R2990 Unspecified symptoms and signs involving the nervous system: Secondary | ICD-10-CM | POA: Diagnosis not present

## 2022-04-18 DIAGNOSIS — I6523 Occlusion and stenosis of bilateral carotid arteries: Secondary | ICD-10-CM | POA: Diagnosis not present

## 2022-04-18 DIAGNOSIS — I672 Cerebral atherosclerosis: Secondary | ICD-10-CM | POA: Diagnosis not present

## 2022-04-18 DIAGNOSIS — G459 Transient cerebral ischemic attack, unspecified: Secondary | ICD-10-CM | POA: Diagnosis not present

## 2022-04-18 DIAGNOSIS — Z8673 Personal history of transient ischemic attack (TIA), and cerebral infarction without residual deficits: Secondary | ICD-10-CM | POA: Diagnosis not present

## 2022-04-18 DIAGNOSIS — I6503 Occlusion and stenosis of bilateral vertebral arteries: Secondary | ICD-10-CM | POA: Diagnosis not present

## 2022-04-18 HISTORY — DX: Unspecified speech disturbances: R47.9

## 2022-04-18 LAB — CBC
HCT: 41.6 % (ref 36.0–46.0)
Hemoglobin: 14.2 g/dL (ref 12.0–15.0)
MCH: 30.9 pg (ref 26.0–34.0)
MCHC: 34.1 g/dL (ref 30.0–36.0)
MCV: 90.4 fL (ref 80.0–100.0)
Platelets: 219 10*3/uL (ref 150–400)
RBC: 4.6 MIL/uL (ref 3.87–5.11)
RDW: 13.7 % (ref 11.5–15.5)
WBC: 11 10*3/uL — ABNORMAL HIGH (ref 4.0–10.5)
nRBC: 0 % (ref 0.0–0.2)

## 2022-04-18 LAB — HEMOGLOBIN A1C
Hgb A1c MFr Bld: 6.4 % — ABNORMAL HIGH (ref 4.8–5.6)
Mean Plasma Glucose: 136.98 mg/dL

## 2022-04-18 LAB — CBG MONITORING, ED
Glucose-Capillary: 95 mg/dL (ref 70–99)
Glucose-Capillary: 99 mg/dL (ref 70–99)

## 2022-04-18 LAB — ECHOCARDIOGRAM COMPLETE
AR max vel: 1.47 cm2
AV Area VTI: 1.57 cm2
AV Area mean vel: 1.53 cm2
AV Mean grad: 15 mmHg
AV Peak grad: 23.7 mmHg
Ao pk vel: 2.43 m/s
Area-P 1/2: 2.11 cm2
Calc EF: 57.9 %
Height: 66 in
S' Lateral: 3.7 cm
Single Plane A2C EF: 68.8 %
Single Plane A4C EF: 46.3 %
Weight: 3680 oz

## 2022-04-18 LAB — LIPID PANEL
Cholesterol: 176 mg/dL (ref 0–200)
HDL: 40 mg/dL — ABNORMAL LOW (ref >40)
LDL Cholesterol: 98 mg/dL (ref 0–99)
Total CHOL/HDL Ratio: 4.4 RATIO
Triglycerides: 191 mg/dL — ABNORMAL HIGH (ref <150)
VLDL: 38 mg/dL (ref 0–40)

## 2022-04-18 LAB — GLUCOSE, CAPILLARY: Glucose-Capillary: 134 mg/dL — ABNORMAL HIGH (ref 70–99)

## 2022-04-18 IMAGING — CT CT ANGIO HEAD-NECK (W OR W/O PERF)
2 of 7 series · 8 of 33 positions shown · non-contrast
Comparison: Brain MRI from yesterday

CLINICAL DATA: Stroke follow-up

EXAM:
CT ANGIOGRAPHY HEAD AND NECK
TECHNIQUE: Multidetector CT imaging of the head and neck was performed using
the standard protocol during bolus administration of intravenous
contrast. Multiplanar CT image reconstructions and MIPs were
obtained to evaluate the vascular anatomy. Carotid stenosis
measurements (when applicable) are obtained utilizing NASCET
criteria, using the distal internal carotid diameter as the
denominator.

[Series 5: cta neck · axial · 0.48mm/px · z∈[-700,-590]mm · 2 of 167 slices shown]
[im 56/167  soft-tissue]
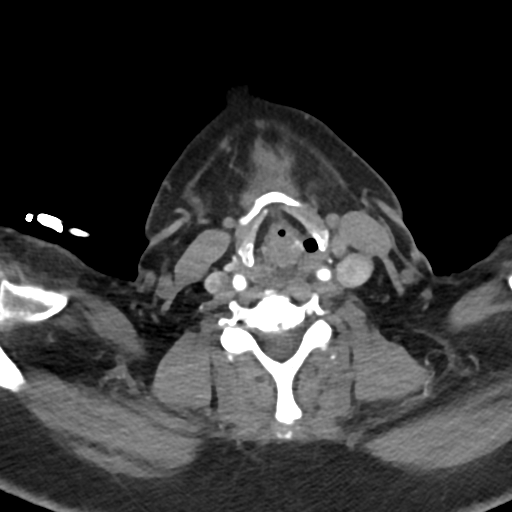
[im 111/167  soft-tissue]
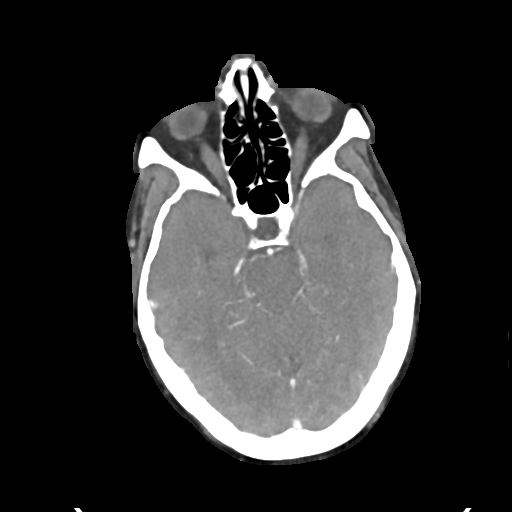

[Series 7: cta neck axial · axial · 0.39mm/px · z∈[-762,-525]mm · 6 of 330 slices shown]
[im 48/330  soft-tissue]
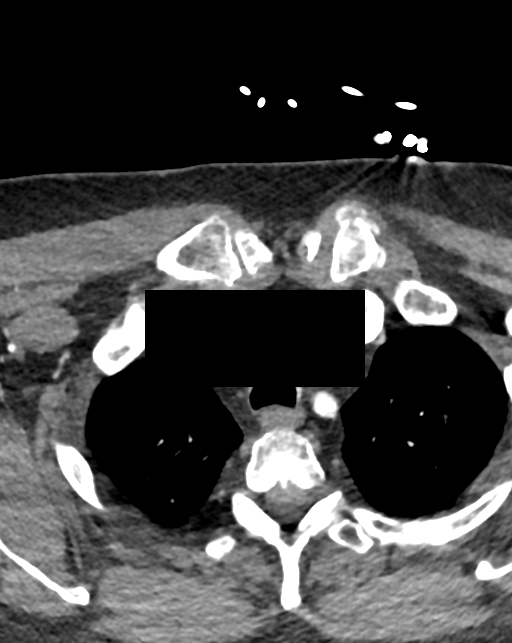
[im 95/330  bone]
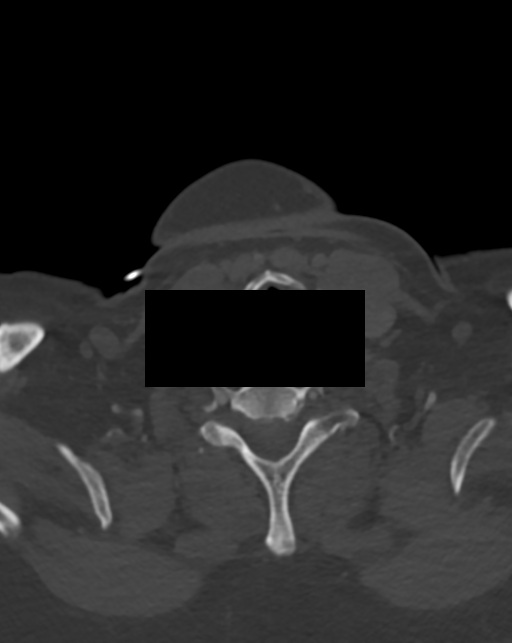
[im 142/330  soft-tissue]
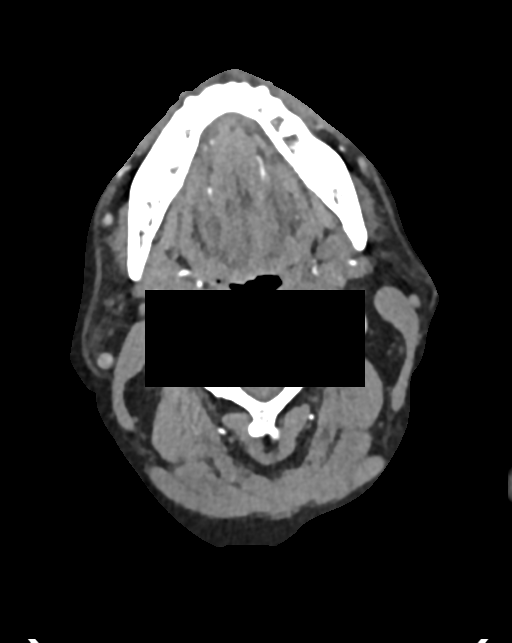
[im 189/330  bone]
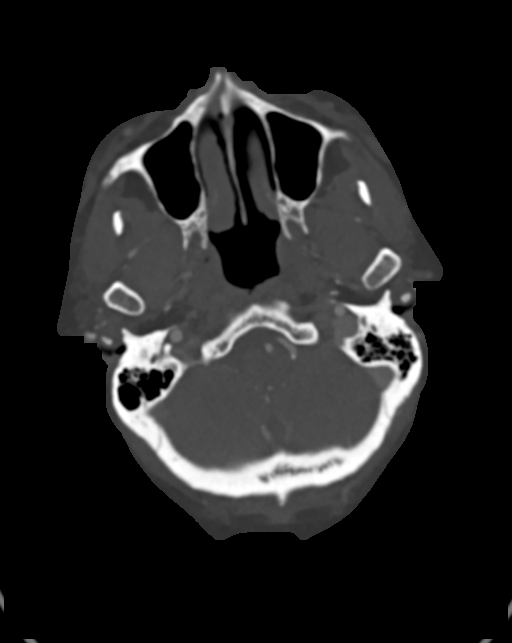
[im 236/330  soft-tissue]
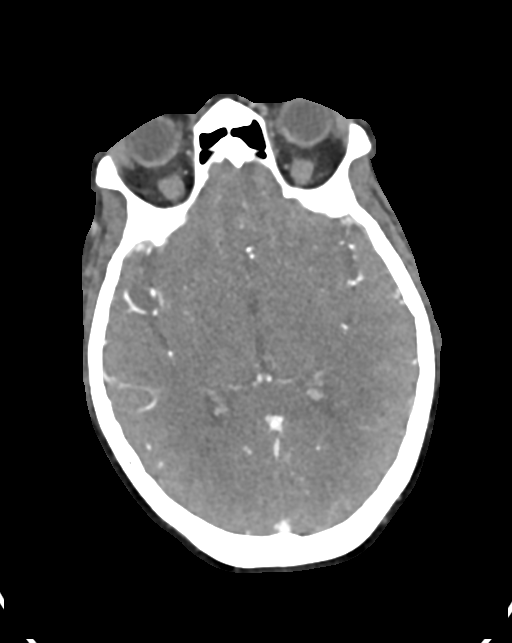
[im 283/330  bone]
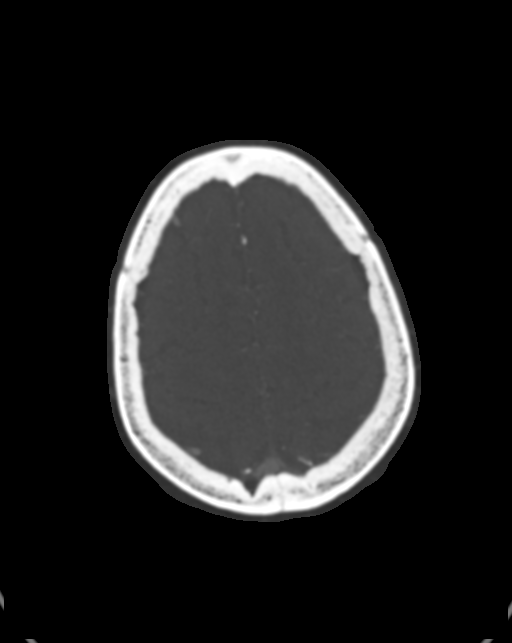

[8 of 33 positions shown; findings below may reference images not displayed]

RADIATION DOSE REDUCTION: This exam was performed according to the
departmental dose-optimization program which includes automated
exposure control, adjustment of the mA and/or kV according to
patient size and/or use of iterative reconstruction technique.

CONTRAST:  75mL OMNIPAQUE IOHEXOL 350 MG/ML SOLN
FINDINGS: CTA NECK FINDINGS

Aortic arch: Atheromatous plaque with 2 vessel arch. No acute
finding

Right carotid system: Diffuse atheromatous wall thickening of the
common carotid with accentuated plaque deposition at the bifurcation
and proximal ICA. No ulceration or CCA/ICA flow reducing stenosis.
There is high-grade narrowing at the origin of the ECA.

Left carotid system: Diffuse atheromatous wall thickening of the
common carotid with accentuated mixed density plaque at the
bifurcation and proximal ICA. No ulceration or CCA/ICA flow reducing
stenosis. There is high-grade narrowing at the origin of the ECA.

Vertebral arteries: No proximal subclavian stenosis. Calcified
plaque at both vertebral origins with mild narrowing. Otherwise the
vertebral arteries are smoothly contoured and widely patent to the
dura.

Skeleton: Generalized cervical spine degeneration

Other neck: No acute finding

Upper chest: No acute finding

Review of the MIP images confirms the above findings

CTA HEAD FINDINGS

Anterior circulation: Atheromatous calcification of the carotid
siphons. No branch occlusion, beading, or aneurysm.

Posterior circulation: Right dominant vertebral artery. Atheromatous
plaque affecting the vertebral arteries. The vertebral and basilar
arteries are widely patent. No branch occlusion, beading, or
aneurysm

Venous sinuses: Diffusely patent

Anatomic variants: None significant

Review of the MIP images confirms the above findings
IMPRESSION: 1. No emergent finding.
2. Diffuse atherosclerosis. High-grade bilateral ECA origin
stenosis.
3. No flow limiting stenosis in the intracranial circulation.

## 2022-04-18 MED ORDER — ACETAMINOPHEN 325 MG PO TABS
650.0000 mg | ORAL_TABLET | ORAL | Status: DC | PRN
  Administered 2022-04-18: 650 mg via ORAL
  Filled 2022-04-18: qty 2

## 2022-04-18 MED ORDER — CLOPIDOGREL BISULFATE 75 MG PO TABS
75.0000 mg | ORAL_TABLET | Freq: Every day | ORAL | Status: DC
  Administered 2022-04-18 – 2022-04-19 (×2): 75 mg via ORAL
  Filled 2022-04-18 (×2): qty 1

## 2022-04-18 MED ORDER — TIZANIDINE HCL 4 MG PO TABS: 2.0000 mg | ORAL_TABLET | Freq: Four times a day (QID) | ORAL | Status: DC | PRN

## 2022-04-18 MED ORDER — DICLOFENAC SODIUM 1 % EX GEL
4.0000 g | Freq: Three times a day (TID) | CUTANEOUS | Status: DC
  Administered 2022-04-18: 4 g via TOPICAL
  Filled 2022-04-18: qty 100

## 2022-04-18 MED ORDER — SENNOSIDES-DOCUSATE SODIUM 8.6-50 MG PO TABS: 1.0000 | ORAL_TABLET | Freq: Every evening | ORAL | Status: DC | PRN

## 2022-04-18 MED ORDER — ACETAMINOPHEN 650 MG RE SUPP: 650.0000 mg | RECTAL | Status: DC | PRN

## 2022-04-18 MED ORDER — ASPIRIN 81 MG PO TBEC
81.0000 mg | DELAYED_RELEASE_TABLET | Freq: Every day | ORAL | Status: DC
  Administered 2022-04-18 – 2022-04-20 (×3): 81 mg via ORAL
  Filled 2022-04-18 (×3): qty 1

## 2022-04-18 MED ORDER — STROKE: EARLY STAGES OF RECOVERY BOOK
Freq: Once | Status: AC
  Filled 2022-04-18: qty 1

## 2022-04-18 MED ORDER — ACETAMINOPHEN 160 MG/5ML PO SOLN: 650.0000 mg | ORAL | Status: DC | PRN

## 2022-04-18 MED ORDER — IOHEXOL 350 MG/ML SOLN
100.0000 mL | Freq: Once | INTRAVENOUS | Status: AC | PRN
  Administered 2022-04-18: 75 mL via INTRAVENOUS

## 2022-04-18 MED ORDER — LABETALOL HCL 5 MG/ML IV SOLN: 10.0000 mg | INTRAVENOUS | Status: DC | PRN

## 2022-04-18 MED ORDER — PERFLUTREN LIPID MICROSPHERE
1.0000 mL | INTRAVENOUS | Status: AC | PRN
  Administered 2022-04-18: 2 mL via INTRAVENOUS

## 2022-04-18 MED ORDER — ENOXAPARIN SODIUM 40 MG/0.4ML IJ SOSY
40.0000 mg | PREFILLED_SYRINGE | Freq: Every day | INTRAMUSCULAR | Status: DC
  Administered 2022-04-18 – 2022-04-20 (×3): 40 mg via SUBCUTANEOUS
  Filled 2022-04-18 (×3): qty 0.4

## 2022-04-18 MED ORDER — ROSUVASTATIN CALCIUM 20 MG PO TABS
20.0000 mg | ORAL_TABLET | Freq: Every day | ORAL | Status: DC
  Administered 2022-04-18 – 2022-04-20 (×3): 20 mg via ORAL
  Filled 2022-04-18 (×3): qty 1

## 2022-04-18 MED ORDER — DARIFENACIN HYDROBROMIDE ER 7.5 MG PO TB24
7.5000 mg | ORAL_TABLET | Freq: Every day | ORAL | Status: DC
  Administered 2022-04-18: 7.5 mg via ORAL
  Filled 2022-04-18 (×4): qty 1

## 2022-04-18 MED ORDER — MELATONIN 3 MG PO TABS: 3.0000 mg | ORAL_TABLET | Freq: Every evening | ORAL | Status: DC | PRN

## 2022-04-18 MED ORDER — INSULIN ASPART 100 UNIT/ML IJ SOLN: 0.0000 [IU] | Freq: Three times a day (TID) | INTRAMUSCULAR | Status: DC

## 2022-04-18 MED ORDER — SODIUM CHLORIDE 0.9 % IV SOLN: INTRAVENOUS | Status: AC

## 2022-04-18 NOTE — ED Provider Notes (Signed)
Blood pressure (!) 146/68, pulse 61, temperature 98 F (36.7 C), temperature source Oral, resp. rate 16, height '5\' 6"'$  (1.676 m), weight 104.3 kg, SpO2 95 %.  Assuming care from Dr. [name].  In short, [name] is a 81 y.o. female with a chief complaint of Aphasia .  Refer to the original H&P for additional details.  The current plan of care is to f/u on Neurology recommendation after consultation.  Discussed case with neurology after their evaluation.  They recommend admit for TIA work-up.  Discussed patient's case with [facility], Dr. Myna Hidalgo to request admission. Patient and family (if present) updated with plan.   I reviewed all nursing notes, vitals, pertinent old records, EKGs, labs, imaging (as available).     Margette Fast, MD 04/18/22 [phone]

## 2022-04-18 NOTE — Progress Notes (Signed)
Echocardiogram 2D Echocardiogram has been performed.  Darlina Sicilian M 04/18/2022, 2:14 PM

## 2022-04-18 NOTE — H&P (Signed)
History and Physical    [name] XFG:[id number] DOB: 11/30/1940 DOA: 04/17/2022  PCP: Cheryl Franco, FNP   Patient coming from: Home   Chief Complaint: Speech difficulty   HPI: [name] is a pleasant 81 y.o. female with medical history significant for type 2 diabetes mellitus, hypertension, hyperlipidemia, and BMI 11, now presenting to the emergency department for evaluation of a transient speech difficulty.  The patient reports that for approximately 10 minutes during the afternoon of 04/16/2022, she had trouble getting words out.  This resolved and she returned to baseline.  She denies any palpitations, headache, focal numbness or weakness, or change in vision associated with this.  No recent fever or chills.  ED Course: Upon arrival to the ED, patient is found to be afebrile, saturating well on room air, and with elevated blood pressure.  EKG features sinus rhythm with chronic LBBB.  Head CT is negative for acute intracranial abnormality but notable for empty sella.  MRI brain is negative for acute or subacute infarction.  Chemistry panel features a creatinine of 1.11.  CBC unremarkable.  Neurology was consulted by the ED physician recommended TIA work-up.  Review of Systems:  All other systems reviewed and apart from HPI, are negative.  Past Medical History:  Diagnosis Date   Abdominal pain    Arthritis    cervical disc degeneration/ oa left knee, carpal tunnel rt wrist, adhesive capsulitis right shoulder, rt hand weakness; lumbar degeneration   Carpal tunnel syndrome    Complication of anesthesia 2006-at [facility]   breathing problems-no BP med given prior to surgery;  hx of being very sleepy after colon surgery --  states no problems with last right total knee replacement 2013   Constipation    Diabetes mellitus without complication ([place])    borderline - diet control   Diverticulitis hx of   Diverticulosis    E. coli infection    2020   Frequent UTI    hx of  urethral injury during colon surgery - states frequent uti's since   GERD (gastroesophageal reflux disease)    H/O hiatal hernia    History of palpitations    in the past   History of shingles    has a lingering itching on back where shingles were   Hyperlipidemia    Hypertension    Numbness and tingling in right hand    pt. states has numbness of right hand very frequently-watch positioning   Obesity    Osteoarthritis    Osteoporosis    Pain    pain left knee and pain right hip and right groin   Personal history of colonic polyps-adenoma 08/26/2008   Pneumonia 2005   Shortness of breath 09/14/2021   Vitamin D deficiency     Past Surgical History:  Procedure Laterality Date   ABDOMINAL HYSTERECTOMY     bladder tack     BREAST BIOPSY Left    BREAST SURGERY     breast duct resection- benign   CARPAL TUNNEL RELEASE Right 06/04/2014   Procedure: RIGHT CARPAL TUNNEL RELEASE;  Surgeon: Roseanne Kaufman, MD;  Location: [facility];  Service: Orthopedics;  Laterality: Right;   COLON RESECTION  2008   hx diverticulosis   COLON SURGERY     COLONOSCOPY     colonoscopy [id number]     JOINT REPLACEMENT     OOPHORECTOMY     POLYPECTOMY     skin graft left arm - traumatic compression injury left upper  arm     temporary ureter stent     TOTAL KNEE ARTHROPLASTY  01/05/2012   Procedure: TOTAL KNEE ARTHROPLASTY;  Surgeon: [name], MD;  Location: [facility];  Service: Orthopedics;  Laterality: Right;   TOTAL KNEE ARTHROPLASTY Left 08/17/2014   Procedure: LEFT TOTAL KNEE ARTHROPLASTY;  Surgeon: [name], MD;  Location: [facility];  Service: Orthopedics;  Laterality: Left;   ureter repair for tyransected left ureter      Social History:   reports that she has never smoked. She has never used smokeless tobacco. She reports that she does not drink alcohol and does not use drugs.  Allergies  Allergen Reactions   Atorvastatin Itching    Other reaction(s): Itching     Atorvastatin Calcium Itching   Bee Venom Hives   Hydrocodone-Acetaminophen Other (See Comments)    hallucinations hallucinations   Oxycodone Other (See Comments)    hallucinations   Vicodin [Hydrocodone-Acetaminophen] Other (See Comments)    hallucinations    Family History  Problem Relation Age of Onset   Breast cancer Mother 58   Hypertension Mother    Prostate cancer Father    Hypertension Father    Dementia Sister    Lung cancer Brother    Hypertension Brother    COPD Brother    Cancer Maternal Grandmother    Arthritis Sister    Hypertension Sister    Arthritis Sister    Hypertension Child    Hypertension Child    Hypertension Child    Colon polyps Neg Hx    Esophageal cancer Neg Hx    Rectal cancer Neg Hx    Stomach cancer Neg Hx    Colon cancer Neg Hx      Prior to Admission medications   Medication Sig Start Date End Date Taking? Authorizing Provider  allopurinol (ZYLOPRIM) 300 MG tablet Take 1 tablet by mouth daily 02/02/22  Yes [name], MD  aspirin EC 81 MG tablet Take 1 tablet (81 mg total) by mouth daily. 02/02/22  Yes [name], MD  atenolol-chlorthalidone (TENORETIC) 50-25 MG tablet Take 1 tablet by mouth daily. 11/07/21  Yes [name], MD  bisacodyl (DULCOLAX) 5 MG EC tablet Take 5 mg by mouth daily as needed for moderate constipation.   Yes [provider]  diclofenac Sodium (VOLTAREN) 1 % GEL Apply 4 g topically 4 (four) times daily. Apply to back 11/26/21  Yes Palumbo, April, MD  Dulaglutide (TRULICITY) 0.99 IP/3.8SN SOPN Inject 0.5 mg into the skin every 14 (fourteen) days.   Yes [provider]  hydrocortisone cream 1 % Apply to affected area 2 times daily 03/01/22 03/01/23 Yes [name], FNP  Magnesium 250 MG TABS Take 1 tablet (250 mg total) by mouth every evening. Take with dinner meal 08/18/21  Yes [name], MD  Multiple Vitamin (MULTIVITAMIN) tablet Take 1 tablet by mouth daily. 08/18/21  Yes [name], MD   Multiple Vitamins-Minerals (ZINC PO) Take 1 tablet by mouth daily.   Yes [provider]  rosuvastatin (CRESTOR) 20 MG tablet TAKE 1 TABLET(20 MG) BY MOUTH DAILY Patient taking differently: Take 20 mg by mouth daily. TAKE 1 TABLET(20 MG) BY MOUTH DAILY 02/02/22  Yes [name], MD  Saccharomyces boulardii (PROBIOTIC) 250 MG CAPS 1 tablet by mouth daily 08/18/21  Yes [name], MD  solifenacin (VESICARE) 5 MG tablet Take 5 mg by mouth every morning. 02/09/22  Yes [provider]  tiZANidine (ZANAFLEX) 4 MG tablet Take 0.5 tablets (  2 mg total) by mouth every 6 (six) hours as needed for muscle spasms. 11/27/21  Yes Luna Fuse, MD  valsartan (DIOVAN) 80 MG tablet Take 1 tablet (80 mg total) by mouth daily. 09/14/21  Yes Skeet Latch, MD  vitamin B-12 (CYANOCOBALAMIN) 100 MCG tablet Take 1 tablet by mouth daily 08/18/21  Yes [name], MD  Vitamin D, Ergocalciferol, (DRISDOL) 1.25 MG (50000 UNIT) CAPS capsule TAKE ONE CAPSULE BY MOUTH ONCE WEEKLY BEFORE BREAKFAST ON  TUESDAYS and TAKE ONE CAPSULE BY MOUTH ONCE WEEKLY BEFORE BREAKFAST ON [place] Patient taking differently: See admin instructions. TAKE ONE CAPSULE BY MOUTH ONCE WEEKLY BEFORE BREAKFAST ON  TUESDAYS and TAKE ONE CAPSULE BY MOUTH ONCE WEEKLY BEFORE BREAKFAST ON FRIDAYS 03/08/22  Yes [name], MD  amoxicillin (AMOXIL) 500 MG capsule Take 1 capsule (500 mg total) by mouth 3 (three) times daily. Patient not taking: Reported on 04/18/2022 03/08/22   [name], MD  benzonatate (TESSALON PERLES) 100 MG capsule Take 1 capsule (100 mg total) by mouth 3 (three) times daily as needed for cough. Patient not taking: Reported on 04/18/2022 03/01/22 03/01/23  [name], FNP  Blood Pressure KIT 1 Units by Does not apply route daily. Check blood pressure daily for hypertension dx code I10 09/14/21   Skeet Latch, MD  Lancets (ONETOUCH DELICA PLUS [id number]) [place] USE TO CHECK BLOOD SUGAR TWICE DAILY 12/24/20    [name], MD  lidocaine (LIDODERM) 5 % Place 1 patch onto the skin daily. Remove & Discard patch within 12 hours or as directed by MD Patient not taking: Reported on 04/18/2022 11/26/21   Randal Buba, April, MD  [facility] VERIO test strip USE TO TEST 2 TIMES DAILY 12/20/20   [name], MD    Physical Exam: Vitals:   04/18/22 0000 04/18/22 0015 04/18/22 0130 04/18/22 0234  BP: (!) 146/68 (!) 157/62 (!) 183/65 (!) 176/72  Pulse: (!) 59 63 61 64  Resp:   17 16  Temp:      TempSrc:      SpO2: 98% 94% 99% 93%  Weight:      Height:        Constitutional: NAD, calm  Eyes: PERTLA, lids and conjunctivae normal ENMT: Mucous membranes are moist. Posterior pharynx clear of any exudate or lesions.   Neck: supple, no masses  Respiratory:  no wheezing, no crackles. No accessory muscle use.  Cardiovascular: S1 & S2 heard, regular rate and rhythm. No significant JVD. Abdomen: No distension, no tenderness, soft. Bowel sounds active.  Musculoskeletal: no clubbing / cyanosis. No joint deformity upper and lower extremities.   Skin: no significant rashes, lesions, ulcers. Warm, dry, well-perfused. Neurologic: CN 2-12 grossly intact. Sensation intact. Strength 5/5 in all 4 limbs. Alert and oriented.  Psychiatric: Very pleasant. Cooperative.    Labs and Imaging on Admission: I have personally reviewed following labs and imaging studies  CBC: Recent Labs  Lab 04/17/22 1302  WBC 10.1  NEUTROABS 6.3  HGB 14.1  HCT 42.2  MCV 90.0  PLT 865   Basic Metabolic Panel: Recent Labs  Lab 04/17/22 1302  NA 140  K 3.9  CL 103  CO2 27  GLUCOSE 109*  BUN 23  CREATININE 1.11*  CALCIUM 10.2   GFR: Estimated Creatinine Clearance: 50.2 mL/min (A) (by C-G formula based on SCr of 1.11 mg/dL (H)). Liver Function Tests: Recent Labs  Lab 04/17/22 1302  AST 28  ALT 25  ALKPHOS 36*  BILITOT 1.1  PROT 7.5  ALBUMIN  4.2   No results for input(s): "LIPASE", "AMYLASE" in the last 168 hours. No  results for input(s): "AMMONIA" in the last 168 hours. Coagulation Profile: Recent Labs  Lab 04/17/22 1302  INR 1.0   Cardiac Enzymes: No results for input(s): "CKTOTAL", "CKMB", "CKMBINDEX", "TROPONINI" in the last 168 hours. BNP (last 3 results) No results for input(s): "PROBNP" in the last 8760 hours. HbA1C: No results for input(s): "HGBA1C" in the last 72 hours. CBG: No results for input(s): "GLUCAP" in the last 168 hours. Lipid Profile: No results for input(s): "CHOL", "HDL", "LDLCALC", "TRIG", "CHOLHDL", "LDLDIRECT" in the last 72 hours. Thyroid Function Tests: No results for input(s): "TSH", "T4TOTAL", "FREET4", "T3FREE", "THYROIDAB" in the last 72 hours. Anemia Panel: No results for input(s): "VITAMINB12", "FOLATE", "FERRITIN", "TIBC", "IRON", "RETICCTPCT" in the last 72 hours. Urine analysis:    Component Value Date/Time   COLORURINE COLORLESS (A) 11/27/2021 0629   APPEARANCEUR CLEAR 11/27/2021 0629   LABSPEC 1.004 (L) 11/27/2021 0629   PHURINE 7.0 11/27/2021 0629   GLUCOSEU NEGATIVE 11/27/2021 0629   HGBUR NEGATIVE 11/27/2021 0629   HGBUR negative 07/18/2010 0915   BILIRUBINUR negative 01/07/2022 1214   BILIRUBINUR Negative 07/12/2021 0947   KETONESUR negative 01/07/2022 1214   KETONESUR NEGATIVE 11/27/2021 0629   PROTEINUR =100 (A) 01/07/2022 1214   PROTEINUR NEGATIVE 11/27/2021 0629   UROBILINOGEN 0.2 01/07/2022 1214   UROBILINOGEN 1.0 01/24/2015 0131   NITRITE Positive (A) 01/07/2022 1214   NITRITE NEGATIVE 11/27/2021 0629   LEUKOCYTESUR Large (3+) (A) 01/07/2022 1214   LEUKOCYTESUR NEGATIVE 11/27/2021 0629   Sepsis Labs: '@LABRCNTIP' (procalcitonin:4,lacticidven:4) )No results found for this or any previous visit (from the past 240 hour(s)).   Radiological Exams on Admission: MR BRAIN WO CONTRAST  Result Date: 04/17/2022 CLINICAL DATA:  Stroke suspected, difficulty speaking resolved EXAM: MRI HEAD WITHOUT CONTRAST TECHNIQUE: Multiplanar, multiecho pulse  sequences of the brain and surrounding structures were obtained without intravenous contrast. COMPARISON:  No prior MRI, correlation is made with CT head 04/17/2022 FINDINGS: Brain: No restricted diffusion to suggest acute or subacute infarct. No acute hemorrhage, mass mass effect, or midline shift. No hydrocephalus or extra-axial collection. Partial empty sella. Minimally low lying cerebellar tonsils, which extend 1-2 mm below the foramen magnum, which does not qualify as cerebellar ectopia or Chiari malformation. Scattered foci of hemosiderin deposition, most prominently in the parietal and occipital lobes, most likely sequela of prior hypertensive microhemorrhages. Scattered and confluent T2 hyperintense signal in the periventricular white matter, likely the sequela of moderate chronic small vessel ischemic disease. Vascular: Normal arterial flow voids. Skull and upper cervical spine: Normal marrow signal. Sinuses/Orbits: No acute finding. Status post bilateral lens replacements. Other: The mastoids are well aerated. IMPRESSION: No acute intracranial process. No evidence of acute or subacute infarct. Electronically Signed   By: Merilyn Baba M.D.   On: 04/17/2022 21:34   CT HEAD WO CONTRAST  Result Date: 04/17/2022 CLINICAL DATA:  Neuro deficit, acute, stroke suspected. Pt arrives to ED with c/o aphasia. Pt reports that yesterday at 4pm pt experienced a 10 minute episode of not being able to speak. She reports she is back at her baseline. EXAM: CT HEAD WITHOUT CONTRAST TECHNIQUE: Contiguous axial images were obtained from the base of the skull through the vertex without intravenous contrast. RADIATION DOSE REDUCTION: This exam was performed according to the departmental dose-optimization program which includes automated exposure control, adjustment of the mA and/or kV according to patient size and/or use of iterative reconstruction technique. COMPARISON:  CT  head 03/02/2022 BRAIN: BRAIN Patchy and confluent  areas of decreased attenuation are noted throughout the deep and periventricular white matter of the cerebral hemispheres bilaterally, compatible with chronic microvascular ischemic disease. No evidence of large-territorial acute infarction. No parenchymal hemorrhage. No mass lesion. No extra-axial collection. No mass effect or midline shift. No hydrocephalus. Basilar cisterns are patent. Empty sella. Vascular: No hyperdense vessel. Atherosclerotic calcifications are present within the cavernous internal carotid and vertebral arteries. Skull: No acute fracture or focal lesion. Sinuses/Orbits: Paranasal sinuses and mastoid air cells are clear. Bilateral lens replacement. Otherwise the orbits are unremarkable. Other: None. IMPRESSION: 1.  No acute intracranial abnormality. 2. Empty sella. Findings is often a normal anatomic variant but can be associated with idiopathic intracranial hypertension (pseudotumor cerebri). Electronically Signed   By: Iven Finn M.D.   On: 04/17/2022 18:25    EKG: Independently reviewed. SR, chronic LBBB.   Assessment/Plan   1. TIA  - Presents after a brief episode of expressive aphasia  - No acute findings on head CT or MRI brain  - Passed swallow screen in ED  - Appreciate neurology consultation, recommending CTA head and neck, TTE, lipid panel, A1c, permissive HTN, cardiac monitoring, PT/OT/SLP consults, and ASA 81 mg qD   2. Type II DM  - A1c was 6.9% in May 2023  - Check CBGs and use low-intensity SSI for now    3. HTN  - Permissive HTN for 24 hrs per neuro recs, hold home medications for now    4. CKD IIIa  - SCr is 1.11 on admission, similar to priors   - Renally-dose medications, monitor    DVT prophylaxis: Lovenox  Code Status: Full  Level of Care: Level of care: Telemetry Medical Family Communication: none present  Disposition Plan:  Patient is from: home  Anticipated d/c is to: Home  Anticipated d/c date is: 04/19/22 Patient currently: Pending  TIA workup  Consults called: neurology  Admission status: Observation     Vianne Bulls, MD [facility]  04/18/2022, 4:19 AM

## 2022-04-18 NOTE — ED Notes (Signed)
Pt in room A&O x4 experiencing no pain or aphasia. Pt denies pain. Room adjusted for comfort. Breakfast tray at bedside.

## 2022-04-18 NOTE — ED Notes (Signed)
Provided peri care to pt, placed a purewick and mesh panties on pt. Pt is resting comfortably at this time.

## 2022-04-18 NOTE — ED Notes (Signed)
Patient requested medication to help her sleep tonight. MD notified.

## 2022-04-18 NOTE — Consult Note (Signed)
NEUROLOGY CONSULTATION NOTE   Date of service: April 18, 2022 Patient Name: [name] MRN:  [id number] DOB:  [birth year] Reason for consult: "Expressive aphasia/speech apraxia" Requesting Provider: Margette Fast, MD _ _ _   _ __   _ __ _ _  __ __   _ __   __ _  History of Present Illness  [name] is a 81 y.o. female with PMH significant for DM2, HTN, HLD, obesity, OA, hiatal hernia, urinary incontinence, who presents with about 61mns episode of inability to speak.  Reports that she was with her husband and they were driving to [facility] when this occurred. She knew what she wanted to say but was unable to get her mouth to move to form words. She was only able to make sounds. No associated symptoms. No headache, no numbness, no tongue swelling, no globus sensation. She was not confused and knew very well what was going on. No prior similar episode. No lightheadednesss, no jerking concerning for a seizure.  Endorsed hx of DM2, HTN, hyperlipidemia, family hx of stroke in her eldest son. Workup here with CTH negative, MRI Brain with no acute stroke. EtOH levels ?elevated but patient denies EtOH intake. No significant electrolyte abnormalities.   She does not smoke, no recreational substances.  LKW: 1500 mRS: 0 tNKASE: not offered, symptoms had completely resolved. Thrombectomy: not offered, symptoms had completely resolved. NIHSS components Score: Comment  1a Level of Conscious 0'[x]'$  1'[]'$  2'[]'$  3'[]'$      1b LOC Questions 0'[x]'$  1'[]'$  2'[]'$       1c LOC Commands 0'[x]'$  1'[]'$  2'[]'$       2 Best Gaze 0'[x]'$  1'[]'$  2'[]'$       3 Visual 0'[x]'$  1'[]'$  2'[]'$  3'[]'$      4 Facial Palsy 0'[x]'$  1'[]'$  2'[]'$  3'[]'$      5a Motor Arm - left 0'[x]'$  1'[]'$  2'[]'$  3'[]'$  4'[]'$  UN'[]'$    5b Motor Arm - Right 0'[x]'$  1'[]'$  2'[]'$  3'[]'$  4'[]'$  UN'[]'$    6a Motor Leg - Left 0'[x]'$  1'[]'$  2'[]'$  3'[]'$  4'[]'$  UN'[]'$    6b Motor Leg - Right 0'[x]'$  1'[]'$  2'[]'$  3'[]'$  4'[]'$  UN'[]'$    7 Limb Ataxia 0'[x]'$  1'[]'$  2'[]'$  3'[]'$  UN'[]'$     8 Sensory 0'[x]'$  1'[]'$  2'[]'$  UN'[]'$      9 Best Language 0'[x]'$  1'[]'$  2'[]'$  3'[]'$      10 Dysarthria 0'[x]'$   1'[]'$  2'[]'$  UN'[]'$      11 Extinct. and Inattention 0'[x]'$  1'[]'$  2'[]'$       TOTAL: 0      ROS   Constitutional Denies weight loss, fever and chills.   HEENT Denies changes in vision and hearing.   Respiratory Denies SOB and cough.   CV Denies palpitations and CP   GI Denies abdominal pain, nausea, vomiting and diarrhea.   GU Denies dysuria and urinary frequency.   MSK Denies myalgia and joint pain.   Skin Denies rash and pruritus.   Neurological Denies headache and syncope.   Psychiatric Denies recent changes in mood. Denies anxiety and depression.    Past History   Past Medical History:  Diagnosis Date   Abdominal pain    Arthritis    cervical disc degeneration/ oa left knee, carpal tunnel rt wrist, adhesive capsulitis right shoulder, rt hand weakness; lumbar degeneration   Carpal tunnel syndrome    Complication of anesthesia 2006-at [facility]   breathing problems-no BP med given prior to surgery;  hx of being very sleepy after colon surgery --  states no problems with last right total knee replacement 2013  Constipation    Diabetes mellitus without complication (HCC)    borderline - diet control   Diverticulitis hx of   Diverticulosis    E. coli infection    2020   Frequent UTI    hx of urethral injury during colon surgery - states frequent uti's since   GERD (gastroesophageal reflux disease)    H/O hiatal hernia    History of palpitations    in the past   History of shingles    has a lingering itching on back where shingles were   Hyperlipidemia    Hypertension    Numbness and tingling in right hand    pt. states has numbness of right hand very frequently-watch positioning   Obesity    Osteoarthritis    Osteoporosis    Pain    pain left knee and pain right hip and right groin   Personal history of colonic polyps-adenoma 08/26/2008   Pneumonia 2005   Shortness of breath 09/14/2021   Vitamin D deficiency    Past Surgical History:  Procedure Laterality Date   ABDOMINAL  HYSTERECTOMY     bladder tack     BREAST BIOPSY Left    BREAST SURGERY     breast duct resection- benign   CARPAL TUNNEL RELEASE Right 06/04/2014   Procedure: RIGHT CARPAL TUNNEL RELEASE;  Surgeon: Roseanne Kaufman, MD;  Location: [facility];  Service: Orthopedics;  Laterality: Right;   COLON RESECTION  2008   hx diverticulosis   COLON SURGERY     COLONOSCOPY     colonoscopy [id number]     JOINT REPLACEMENT     OOPHORECTOMY     POLYPECTOMY     skin graft left arm - traumatic compression injury left upper arm     temporary ureter stent     TOTAL KNEE ARTHROPLASTY  01/05/2012   Procedure: TOTAL KNEE ARTHROPLASTY;  Surgeon: [name], MD;  Location: [facility];  Service: Orthopedics;  Laterality: Right;   TOTAL KNEE ARTHROPLASTY Left 08/17/2014   Procedure: LEFT TOTAL KNEE ARTHROPLASTY;  Surgeon: [name], MD;  Location: [facility];  Service: Orthopedics;  Laterality: Left;   ureter repair for tyransected left ureter     Family History  Problem Relation Age of Onset   Breast cancer Mother 25   Hypertension Mother    Prostate cancer Father    Hypertension Father    Dementia Sister    Lung cancer Brother    Hypertension Brother    COPD Brother    Cancer Maternal Grandmother    Arthritis Sister    Hypertension Sister    Arthritis Sister    Hypertension Child    Hypertension Child    Hypertension Child    Colon polyps Neg Hx    Esophageal cancer Neg Hx    Rectal cancer Neg Hx    Stomach cancer Neg Hx    Colon cancer Neg Hx    Social History   Socioeconomic History   Marital status: Married    Spouse name: Not on file   Number of children: 6   Years of education: Not on file   Highest education level: Not on file  Occupational History   Occupation: DESIGNER    Employer: [employer]  Tobacco Use   Smoking status: Never   Smokeless tobacco: Never  Vaping Use   Vaping Use: Never used  Substance and Sexual Activity   Alcohol use: No   Drug  use: No  Sexual activity: Not Currently  Other Topics Concern   Not on file  Social History Narrative   The patient is married and has 6 children   Operates a business   No alcohol tobacco or drug use   Social Determinants of Health   Financial Resource Strain: Low Risk  (05/19/2021)   Overall Financial Resource Strain (CARDIA)    Difficulty of Paying Living Expenses: Not hard at all  Food Insecurity: No Food Insecurity (05/19/2021)   Hunger Vital Sign    Worried About Running Out of Food in the Last Year: Never true    Ran Out of Food in the Last Year: Never true  Transportation Needs: No Transportation Needs (05/19/2021)   PRAPARE - Hydrologist (Medical): No    Lack of Transportation (Non-Medical): No  Physical Activity: Inactive (05/19/2021)   Exercise Vital Sign    Days of Exercise per Week: 0 days    Minutes of Exercise per Session: 0 min  Stress: No Stress Concern Present (05/19/2021)   [place]    Feeling of Stress : Only a little  Social Connections: Socially Integrated (02/05/2019)   Social Connection and Isolation Panel [NHANES]    Frequency of Communication with Friends and Family: More than three times a week    Frequency of Social Gatherings with Friends and Family: More than three times a week    Attends Religious Services: More than 4 times per year    Active Member of Clubs or Organizations: Yes    Attends Music therapist: More than 4 times per year    Marital Status: Married   Allergies  Allergen Reactions   Atorvastatin Itching    Other reaction(s): Itching    Atorvastatin Calcium Itching   Bee Venom Hives   Hydrocodone-Acetaminophen Other (See Comments)    hallucinations hallucinations   Oxycodone Other (See Comments)    hallucinations   Vicodin [Hydrocodone-Acetaminophen] Other (See Comments)    hallucinations    Medications  (Not in a  hospital admission)    Vitals   Vitals:   04/17/22 2345 04/18/22 0000 04/18/22 0015 04/18/22 0130  BP: (!) 156/68 (!) 146/68 (!) 157/62 (!) 183/65  Pulse: 64 (!) 59 63 61  Resp:    17  Temp:      TempSrc:      SpO2: 95% 98% 94% 99%  Weight:      Height:         Body mass index is 37.12 kg/m.  Physical Exam   General: Laying comfortably in bed; in no acute distress.  HENT: Normal oropharynx and mucosa. Normal external appearance of ears and nose.  Neck: Supple, no pain or tenderness  CV: No JVD. No peripheral edema.  Pulmonary: Symmetric Chest rise. Normal respiratory effort.  Abdomen: Soft to touch, non-tender.  Ext: No cyanosis, edema, or deformity  Skin: No rash. Normal palpation of skin.   Musculoskeletal: Normal digits and nails by inspection. No clubbing.   Neurologic Examination  Mental status/Cognition: Alert, oriented to self, place, month and year, good attention.  Speech/language: Fluent, comprehension intact, object naming intact, repetition intact.  Cranial nerves:   CN II Pupils equal and reactive to light, no VF deficits    CN III,IV,VI EOM intact, no gaze preference or deviation, no nystagmus    CN V normal sensation in V1, V2, and V3 segments bilaterally    CN VII no asymmetry, no nasolabial  fold flattening    CN VIII normal hearing to speech    CN IX & X normal palatal elevation, no uvular deviation    CN XI 5/5 head turn and 5/5 shoulder shrug bilaterally    CN XII midline tongue protrusion    Motor:  Muscle bulk: normal, tone normal, pronator drift none tremor none Mvmt Root Nerve  Muscle Right Left Comments  SA C5/6 Ax Deltoid 5 5   EF C5/6 Mc Biceps 5 5   EE C6/7/8 Rad Triceps 5 5   WF C6/7 Med FCR     WE C7/8 PIN ECU     F Ab C8/T1 U ADM/FDI 5 5   HF L1/2/3 Fem Illopsoas 5 5   KE L2/3/4 Fem Quad 5 5   DF L4/5 D Peron Tib Ant 5 5   PF S1/2 Tibial Grc/Sol 5 5    Reflexes:  Right Left Comments  Pectoralis      Biceps (C5/6) 2 2    Brachioradialis (C5/6) 2 2    Triceps (C6/7) 2 2    Patellar (L3/4) 1 1 BL TKA   Achilles (S1) 1 1    Hoffman      Plantar     Jaw jerk    Sensation:  Light touch Intact throughout   Pin prick    Temperature    Vibration   Proprioception    Coordination/Complex Motor:  - Finger to Nose intact BL - Heel to shin intact BL - Rapid alternating movement are normal - Gait: Deferred.  Labs   CBC:  Recent Labs  Lab 04/17/22 1302  WBC 10.1  NEUTROABS 6.3  HGB 14.1  HCT 42.2  MCV 90.0  PLT 983    Basic Metabolic Panel:  Lab Results  Component Value Date   NA 140 04/17/2022   K 3.9 04/17/2022   CO2 27 04/17/2022   GLUCOSE 109 (H) 04/17/2022   BUN 23 04/17/2022   CREATININE 1.11 (H) 04/17/2022   CALCIUM 10.2 04/17/2022   GFRNONAA 51 (L) 04/17/2022   GFRAA 54 (L) 06/01/2020   Lipid Panel:  Lab Results  Component Value Date   LDLCALC 103 (H) 03/01/2022   HgbA1c:  Lab Results  Component Value Date   HGBA1C 6.9 (H) 03/01/2022   Urine Drug Screen: No results found for: "LABOPIA", "COCAINSCRNUR", "LABBENZ", "AMPHETMU", "THCU", "LABBARB"  Alcohol Level     Component Value Date/Time   ETH 10 (H) 04/17/2022 1302    CT Head without contrast(Personally reviewed): CTH was negative for a large hypodensity concerning for a large territory infarct or hyperdensity concerning for an ICH  MRI Brain(Personally reviewed): No Acute stroke  Impression   [name] is a 81 y.o. female with PMH significant for DM2, HTN, HLD, obesity, OA, hiatal hernia, urinary incontinence, who presents with about 71mns episode of expressive aphasia/aphemia with spontaneous and complete resolution. Episode most concerning for a TIA. Somewhat atypical for TIA to last only 10 mins but given her risk factors and no other associated symptoms, suspect this was a TIA.  Primary Diagnosis:  TIA  Recommendations   - Frequent Neuro checks per stroke unit protocol - Recommend Vascular  imaging with CT Angio head and neck - Recommend obtaining TTE - Recommend obtaining Lipid panel with LDL - Please start statin if LDL > 70 - Recommend HbA1c - Antithrombotic - Aspirin '81mg'$  daily. - Recommend DVT ppx - SBP goal - permissive hypertension first 24 h < 220/110. Held home meds.  -  Recommend Telemetry monitoring for arrythmia - Recommend bedside swallow screen prior to PO intake. - Stroke education booklet - Recommend PT/OT/SLP consult   Plan discussed with Dr. Laverta Baltimore. ______________________________________________________________________   Thank you for the opportunity to take part in the care of this patient. If you have any further questions, please contact the neurology consultation attending.  Signed,  [place] Pager Number [phone] _ _ _   _ __   _ __ _ _  __ __   _ __   __ _

## 2022-04-18 NOTE — Care Plan (Signed)
This 81 years old female with PMH significant for type 2 diabetes, hypertension, hyperlipidemia, BMI 37 presented in the ED for evaluation of transient speech difficulty.  Patient reports that approximately lasted 10 minutes during afternoon she had trouble getting words out.  This has resolved and returned to baseline.  She denies any palpitations, headache,, focal numbness weakness or change in vision.  CTA head negative for acute intracranial abnormality but notable for empty sella.  MRI is negative for acute or subacute infarction.  Neurology is consulted,  recommended TIA work-up.  Patient was seen and examined at bedside.  She reports doing much better.  Echo was done, report pending.

## 2022-04-18 NOTE — Progress Notes (Signed)
Physical Therapy Evaluation Patient Details Name: [name] MRN: [id number] DOB: Dec 31, 1940 Today's Date: 04/18/2022  History of Present Illness  81 yo female with onset of transient speech change was admitted to ED on 6/19, resulting in baseline recovery on 6/18.  Pt also reports a fall at her car in the last month, in which she was also evaluated in ED.  Cleared by MRI for changes in brain, neuro is going to eval for TIA.  PMHx:  DM, HTN, HLD, BMI 37, lumbar DJD, shingles, diverticulitis, CTS, DDD cervical spine, OA, osteoporosis, PNA, UTI, B TKA's  Clinical Impression  Pt is coming to hospital from home with a recent fall event that may have been triggered by a L knee stability issue.  Pt has a lateral shift in the joint alignment, with some gait changes that are occurring as well.  Recommend outpatient therapy to work on balance and proprioceptive and strength issues to recover her safety and reduce further fall risk.  Follow up inpatient care with work on balance and strengthening as tolerated.  Pt is very motivated to recover her ability to walk independently on all surfaces, but recommended to her to use a rollator for any outdoor or uneven situations.       Recommendations for follow up therapy are one component of a multi-disciplinary discharge planning process, led by the attending physician.  Recommendations may be updated based on patient status, additional functional criteria and insurance authorization.  Follow Up Recommendations Outpatient PT      Assistance Recommended at Discharge Set up Supervision/Assistance  Patient can return home with the following  A little help with walking and/or transfers;A little help with bathing/dressing/bathroom;Assistance with cooking/housework;Help with stairs or ramp for entrance;Assist for transportation    Equipment Recommendations Rollator (4 wheels)  Recommendations for Other Services       Functional Status Assessment Patient has  had a recent decline in their functional status and demonstrates the ability to make significant improvements in function in a reasonable and predictable amount of time.     Precautions / Restrictions Precautions Precautions: Fall Precaution Comments: recent fall in parking lot prior to this admit Restrictions Weight Bearing Restrictions: No      Mobility  Bed Mobility Overal bed mobility: Needs Assistance Bed Mobility: Supine to Sit, Sit to Supine     Supine to sit: Min assist Sit to supine: Min assist   General bed mobility comments: min assist to support trunk and return to supine safely    Transfers Overall transfer level: Needs assistance Equipment used: 1 person hand held assist Transfers: Sit to/from Stand Sit to Stand: Min assist           General transfer comment: min guard to min assist with pt demonstrating awareness of her balance upon standing    Ambulation/Gait Ambulation/Gait assistance: Min guard Gait Distance (Feet): 140 Feet Assistive device: 1 person hand held assist Gait Pattern/deviations: Step-through pattern, Decreased stride length, Narrow base of support Gait velocity: reduced   Pre-gait activities: standing balance ck General Gait Details: pt walked with no AD, min guard to manage safety and return to room with quick turns and stops with no LOB  Stairs            Wheelchair Mobility    Modified Rankin (Stroke Patients Only)       Balance Overall balance assessment: Needs assistance Sitting-balance support: Feet supported Sitting balance-Leahy Scale: Fair     Standing balance support: No upper extremity supported,  During functional activity Standing balance-Leahy Scale: Fair                               Pertinent Vitals/Pain Pain Assessment Pain Assessment: No/denies pain    Home Living Family/patient expects to be discharged to:: Private residence Living Arrangements: Spouse/significant  other;Children Available Help at Discharge: Family;Available 24 hours/day Type of Home: House Home Access: Stairs to enter Entrance Stairs-Rails: Left Entrance Stairs-Number of Steps: 3   Home Layout: One level Home Equipment: Rollator (4 wheels);Cane - single point;Crutches;Shower seat;Grab bars - tub/shower Additional Comments: home with family assist, husband is very helpful    Prior Function Prior Level of Function : Independent/Modified Independent                     Hand Dominance   Dominant Hand: Right    Extremity/Trunk Assessment   Upper Extremity Assessment Upper Extremity Assessment: Overall WFL for tasks assessed    Lower Extremity Assessment Lower Extremity Assessment: Generalized weakness    Cervical / Trunk Assessment Cervical / Trunk Assessment: Kyphotic (mild change)  Communication   Communication: No difficulties  Cognition Arousal/Alertness: Awake/alert Behavior During Therapy: WFL for tasks assessed/performed Overall Cognitive Status: Within Functional Limits for tasks assessed                                          General Comments General comments (skin integrity, edema, etc.): pt was able to walk with PT and has mild changes of stability with recent falls.  Her plan is to have outpatient therapy to work on her recent fall issues with mild strength changes    Exercises     Assessment/Plan    PT Assessment Patient needs continued PT services  PT Problem List Decreased strength;Decreased balance;Decreased coordination       PT Treatment Interventions DME instruction;Gait training;Stair training;Functional mobility training;Therapeutic activities;Therapeutic exercise;Balance training;Neuromuscular re-education;Patient/family education    PT Goals (Current goals can be found in the Care Plan section)  Acute Rehab PT Goals Patient Stated Goal: to get home and recover her control of walking PT Goal Formulation: With  patient/family Time For Goal Achievement: 05/02/22 Potential to Achieve Goals: Good    Frequency Min 3X/week     Co-evaluation               AM-PAC PT "6 Clicks" Mobility  Outcome Measure Help needed turning from your back to your side while in a flat bed without using bedrails?: None Help needed moving from lying on your back to sitting on the side of a flat bed without using bedrails?: A Little Help needed moving to and from a bed to a chair (including a wheelchair)?: A Little Help needed standing up from a chair using your arms (e.g., wheelchair or bedside chair)?: A Little Help needed to walk in hospital room?: A Little Help needed climbing 3-5 steps with a railing? : A Lot 6 Click Score: 18    End of Session Equipment Utilized During Treatment: Gait belt Activity Tolerance: Patient tolerated treatment well Patient left: in bed;with call bell/phone within reach;with family/visitor present Nurse Communication: Mobility status PT Visit Diagnosis: Unsteadiness on feet (R26.81);History of falling (Z91.81)    Time: 9562-1308 PT Time Calculation (min) (ACUTE ONLY): 27 min   Charges:   PT Evaluation $PT Eval Moderate Complexity: 1  Mod PT Treatments $Gait Training: 8-22 mins       Ramond Dial 04/18/2022, 6:17 PM  Mee Hives, PT PhD Acute Rehab Dept. Number: [place] [facility] [place]

## 2022-04-18 NOTE — ED Notes (Signed)
Back from ct.

## 2022-04-18 NOTE — ED Notes (Signed)
To CT

## 2022-04-18 NOTE — Plan of Care (Addendum)
Neurology Consult Plan of Care   Patient being followed by neurology on consult for: TIA with transient speech disturbance   In brief [name]  is a 81 y.o. female with PMH significant for DM2, HTN, HLD, obesity, OA, hiatal hernia, urinary incontinence who presented to [facility] ED after a 10 minute episode of inability to speak that had resolved by the time of presentation.  TIA work up in process including: ABCD2 score 5 - Frequent Neuro checks per stroke unit protocol - Recommend Vascular imaging with CT Angio head and neck- no emergent finding, diffuse atherosclerosis and high grade bilateral ECA origin stenosis.  - Recommend obtaining TTE- EF 50-55% and no shunt - Recommend obtaining Lipid panel with LDL-98 and Triglycerides 191 - Continue home Crestor '20mg'$  daily - Recommend HbA1c-6.4 - Antithrombotic - DAPT for 21 days with Aspirin '81mg'$  daily and Plavix '75mg'$ . Then discontinue ASA and continue Plavix '75mg'$  monotherapy going thereafter.  - Recommend DVT ppx - SBP goal - permissive hypertension first 24 h < 220/110. Held home meds.  - Recommend Telemetry monitoring for arrythmia - Recommend bedside swallow screen prior to PO intake. - Stroke education booklet - Recommend PT/OT/SLP consult     Neurology will continue to follow. Please call us with any new neurological deficits or concerns   Sonia Side, AGNP-BC [facility] [phone]  From 7a-7p please page over night on-call Neurologist  04/18/22 4:59 PM  STROKE MD NOTE : I have personally obtained history,examined this patient, reviewed notes, independently viewed imaging studies, participated in medical decision making and plan of care.ROS completed by me personally and pertinent positives fully documented  I have made any additions or clarifications directly to the above note. Agree with note above.  Patient presented with: Episode of expressive aphasia which has resolved.  Neurologic exam is unremarkable.   ABCD2 square score is 5.  Patient also appears to be at risk for sleep apnea.  MRI scan is negative for acute stroke CT angiogram showed only mild atherosclerotic changes with moderate external carotid artery stenosis.  Hemoglobin A1c 6.4.  LDL cholesterol is 98.  Echocardiogram is pending.  Recommend aspirin and Plavix for 3 weeks followed by Plavix alone.  Patient may also consider possible participation in the sleep smart study if interested.  She will be given information to review and decide.  If she signs consent form she will undergo overnight NOx 3 monitor for screening for obstructive sleep apnea. Discussed with patient and answered questions.  Discussed with Dr. Dwyane Dee.  Greater than 50% time during this 35-minute visit was spent on counseling and coordination of care and discussion patient and care team and answering questions. Antony Contras, MD Medical Director [facility] Pager: [phone] 04/18/2022 5:16 PM

## 2022-04-18 NOTE — ED Notes (Signed)
Checked patient cbg it was 29

## 2022-04-19 ENCOUNTER — Ambulatory Visit: Payer: Self-pay

## 2022-04-19 DIAGNOSIS — E559 Vitamin D deficiency, unspecified: Secondary | ICD-10-CM

## 2022-04-19 DIAGNOSIS — R479 Unspecified speech disturbances: Secondary | ICD-10-CM | POA: Diagnosis not present

## 2022-04-19 DIAGNOSIS — N183 Chronic kidney disease, stage 3 unspecified: Secondary | ICD-10-CM

## 2022-04-19 DIAGNOSIS — E1122 Type 2 diabetes mellitus with diabetic chronic kidney disease: Secondary | ICD-10-CM

## 2022-04-19 DIAGNOSIS — R2990 Unspecified symptoms and signs involving the nervous system: Secondary | ICD-10-CM | POA: Diagnosis not present

## 2022-04-19 DIAGNOSIS — I129 Hypertensive chronic kidney disease with stage 1 through stage 4 chronic kidney disease, or unspecified chronic kidney disease: Secondary | ICD-10-CM

## 2022-04-19 LAB — GLUCOSE, CAPILLARY
Glucose-Capillary: 117 mg/dL — ABNORMAL HIGH (ref 70–99)
Glucose-Capillary: 116 mg/dL — ABNORMAL HIGH (ref 70–99)
Glucose-Capillary: 111 mg/dL — ABNORMAL HIGH (ref 70–99)
Glucose-Capillary: 113 mg/dL — ABNORMAL HIGH (ref 70–99)

## 2022-04-19 LAB — CBC
HCT: 38.5 % (ref 36.0–46.0)
Hemoglobin: 12.9 g/dL (ref 12.0–15.0)
MCH: 30.2 pg (ref 26.0–34.0)
MCHC: 33.5 g/dL (ref 30.0–36.0)
MCV: 90.2 fL (ref 80.0–100.0)
Platelets: 189 10*3/uL (ref 150–400)
RBC: 4.27 MIL/uL (ref 3.87–5.11)
RDW: 13.7 % (ref 11.5–15.5)
WBC: 9.5 10*3/uL (ref 4.0–10.5)
nRBC: 0 % (ref 0.0–0.2)

## 2022-04-19 NOTE — TOC CAGE-AID Note (Signed)
Transition of Care [facility]) - CAGE-AID Screening   Patient Details  Name: [name] MRN: [id number] Date of Birth: 1941-10-25  Transition of Care [facility]) CM/SW Contact:    Coralee Pesa, [place] Phone Number: 04/19/2022, 11:06 AM   Clinical Narrative: CSW met with pt at bedside to complete CAGE- AID assessment. Pt states that she has never used drugs or alcohol.   CAGE-AID Screening:    Have You Ever Felt You Ought to Cut Down on Your Drinking or Drug Use?: No Have People Annoyed You By Critizing Your Drinking Or Drug Use?: No Have You Felt Bad Or Guilty About Your Drinking Or Drug Use?: No Have You Ever Had a Drink or Used Drugs First Thing In The Morning to Steady Your Nerves or to Get Rid of a Hangover?: No CAGE-AID Score: 0  Substance Abuse Education Offered: No

## 2022-04-19 NOTE — Progress Notes (Signed)
Stroke Team Progress Note  SUBJECTIVE Patient is sitting up in a bedside chair.  [name] is doing well and has no complaints.  [name] did sign consent and participate in the sleep smart study and had overnight NOx 3 monitor which was positive for sleep apnea.  [name] will have CPAP mask titration trial tonight OBJECTIVE Most recent Vital Signs: Temp: 98 F (36.7 C) (06/21 1218) Temp Source: Oral (06/21 0751) BP: 148/71 (06/21 1218) Pulse Rate: 71 (06/21 1218) Respiratory Rate: 18 O2 Saturdation: 98%  CBG (last 3)  Recent Labs    04/18/22 1710 04/19/22 0656 04/19/22 1145  GLUCAP 134* 117* 116*       Studies:  CT brain no acute abnormality MRI brain no acute abnormality, stroke CTA brain and neck diffuse mild atherosclerosis.  No large vessel intracranial extracranial stenosis or occlusion.  High-grade bilateral external carotid artery origin stenosis.   ECHO ejection fraction 50 to 55%.  No right to left shunt. LDL 98 mg percent.  Triglycerides 191 mg percent. HbA1c 6.4.  Physical Exam:    Pleasant obese elderly African-American lady not in distress. . Afebrile. Head is nontraumatic. Neck is supple without bruit.    Cardiac exam no murmur or gallop. Lungs are clear to auscultation. Distal pulses are well felt.  Neurological Exam ;  Awake  Alert oriented x 3. Normal speech and language.eye movements full without nystagmus.fundi were not visualized. Vision acuity and fields appear normal. Hearing is normal. Palatal movements are normal. Face symmetric. Tongue midline. Normal strength, tone, reflexes and coordination. Normal sensation. Gait deferred. NIH is 0.  ABCD2 square score 5 ASSESSMENT [name] is a 81 y.o. female with a transient episode of expressive aphasia likely the left hemispheric TIA.  Vascular risk factors of obesity, mild hyperlipidemia, borderline diabetes, hypertension and at risk for sleep apnea    Hospital day # 0  TREATMENT/PLAN Continue aspirin Plavix  for 3 weeks followed by Plavix alone and aggressive risk factor modification.  Patient has tested positive for sleep apnea on the NOx 3 monitor and is participating in the sleep smart study.  [name] will undergo CPAP mask tolerability test tonight.  Discussed with patient and Dr. Dwyane Dee and answered questions.  I spent 35 minutes in total face-to-face time with the patient, more than 50% of which was spent in counseling and coordination of care, reviewing test results, reviewing medication and discussing or reviewing the diagnosis of    , the prognosis and treatment options.   Antony Contras, MD Medical Director [facility] Stroke Center Pager: [phone] 04/19/2022 1:10 PM

## 2022-04-19 NOTE — Evaluation (Signed)
Clinical/Bedside Swallow Evaluation Patient Details  Name: [name] MRN: [id number] Date of Birth: 10/18/1941  Today's Date: 04/19/2022 Time: SLP Start Time (ACUTE ONLY): 58 SLP Stop Time (ACUTE ONLY): 2694 SLP Time Calculation (min) (ACUTE ONLY): 15 min  Past Medical History:  Past Medical History:  Diagnosis Date   Abdominal pain    Arthritis    cervical disc degeneration/ oa left knee, carpal tunnel rt wrist, adhesive capsulitis right shoulder, rt hand weakness; lumbar degeneration   Carpal tunnel syndrome    Complication of anesthesia 2006-at [facility]   breathing problems-no BP med given prior to surgery;  hx of being very sleepy after colon surgery --  states no problems with last right total knee replacement 2013   Constipation    Diabetes mellitus without complication ([place])    borderline - diet control   Diverticulitis hx of   Diverticulosis    E. coli infection    2020   Frequent UTI    hx of urethral injury during colon surgery - states frequent uti's since   GERD (gastroesophageal reflux disease)    H/O hiatal hernia    History of palpitations    in the past   History of shingles    has a lingering itching on back where shingles were   Hyperlipidemia    Hypertension    Numbness and tingling in right hand    [name]. states has numbness of right hand very frequently-watch positioning   Obesity    Osteoarthritis    Osteoporosis    Pain    pain left knee and pain right hip and right groin   Personal history of colonic polyps-adenoma 08/26/2008   Pneumonia 2005   Shortness of breath 09/14/2021   Vitamin D deficiency    Past Surgical History:  Past Surgical History:  Procedure Laterality Date   ABDOMINAL HYSTERECTOMY     bladder tack     BREAST BIOPSY Left    BREAST SURGERY     breast duct resection- benign   CARPAL TUNNEL RELEASE Right 06/04/2014   Procedure: RIGHT CARPAL TUNNEL RELEASE;  Surgeon: Roseanne Kaufman, MD;  Location: [place];   Service: Orthopedics;  Laterality: Right;   COLON RESECTION  2008   hx diverticulosis   COLON SURGERY     COLONOSCOPY     colonoscopy [id number]     JOINT REPLACEMENT     OOPHORECTOMY     POLYPECTOMY     skin graft left arm - traumatic compression injury left upper arm     temporary ureter stent     TOTAL KNEE ARTHROPLASTY  01/05/2012   Procedure: TOTAL KNEE ARTHROPLASTY;  Surgeon: [name], MD;  Location: [facility];  Service: Orthopedics;  Laterality: Right;   TOTAL KNEE ARTHROPLASTY Left 08/17/2014   Procedure: LEFT TOTAL KNEE ARTHROPLASTY;  Surgeon: [name], MD;  Location: [facility];  Service: Orthopedics;  Laterality: Left;   ureter repair for tyransected left ureter     HPI:  [name] is a 81 yo female who presented with onset of transient speech change. MRI negative. PMHx:  DM, HTN, HLD, BMI 37, lumbar DJD, shingles, diverticulitis, CTS, DDD cervical spine, OA, osteoporosis, PNA, UTI, B TKA    Assessment / Plan / Recommendation  Clinical Impression  [name] was seen for bedside swallow evaluation. She reported globus sensation while eating baby back ribs which resulted in her not being able to speak. She stated that she has had this sensation once prior  in March when swallowing a pill. [name] identified the approximate level of the sternal notch as the site of the sensation and stated that she has had some relief by applying pressure. Oral mechanism exam was [facility] and dentition was adequate. She tolerated all solids and liquids without signs or symptoms of oropharyngeal dysphagia, but reported fear of swallowing and that she must masticate foods very thoroughly to ensure this "sticking" does not occur. [name]'s current diet of regular texture solids and thin liquids will be continued. A modified barium swallow study is recommended to further assess oropharyngeal swallowing physiology. Consider esophageal assessment to rule out esophageal dysphagia as cause of [name]'s symptoms; This was discussed  with Dr. Dwyane Dee who is in agreement. SLP Visit Diagnosis: Dysphagia, unspecified (R13.10)    Aspiration Risk  Mild aspiration risk    Diet Recommendation Regular;Thin liquid   Liquid Administration via: Cup;Straw Medication Administration: Whole meds with liquid Supervision: Patient able to self feed Compensations: Slow rate;Small sips/bites;Follow solids with liquid Postural Changes: Seated upright at 90 degrees    Other  Recommendations Recommended Consults: Consider esophageal assessment Oral Care Recommendations: Oral care BID    Recommendations for follow up therapy are one component of a multi-disciplinary discharge planning process, led by the attending physician.  Recommendations may be updated based on patient status, additional functional criteria and insurance authorization.  Follow up Recommendations  (TBD)      Assistance Recommended at Discharge    Functional Status Assessment Patient has had a recent decline in their functional status and demonstrates the ability to make significant improvements in function in a reasonable and predictable amount of time.  Frequency and Duration min 2x/week  2 weeks       Prognosis        Swallow Study   General Date of Onset: 04/18/22 HPI: [name] is a 81 yo female who presented with onset of transient speech change. MRI negative. PMHx:  DM, HTN, HLD, BMI 37, lumbar DJD, shingles, diverticulitis, CTS, DDD cervical spine, OA, osteoporosis, PNA, UTI, B TKA Type of Study: Bedside Swallow Evaluation Previous Swallow Assessment: none Diet Prior to this Study: Regular;Thin liquids Temperature Spikes Noted: No Respiratory Status: Room air History of Recent Intubation: No Behavior/Cognition: Alert;Cooperative;Pleasant mood Oral Cavity Assessment: Within Functional Limits Oral Care Completed by SLP: No Oral Cavity - Dentition: Adequate natural dentition Vision: Functional for self-feeding Self-Feeding Abilities: Able to feed  self Patient Positioning: Upright in bed;Postural control adequate for testing Baseline Vocal Quality: Normal Volitional Cough: Strong Volitional Swallow: Able to elicit    Oral/Motor/Sensory Function Overall Oral Motor/Sensory Function: Within functional limits   Ice Chips Ice chips: Within functional limits Presentation: Spoon   Thin Liquid Thin Liquid: Within functional limits Presentation: Straw    Nectar Thick Nectar Thick Liquid: Not tested   Honey Thick Honey Thick Liquid: Not tested   Puree Puree: Within functional limits Presentation: Spoon   Solid     Solid: Within functional limits Presentation: [place] I. Hardin Negus, [place], [place] Office number [phone] Pager [phone]  Horton Marshall 04/19/2022,12:18 PM

## 2022-04-19 NOTE — Progress Notes (Signed)
Physical Therapy Treatment Patient Details Name: [name] MRN: [id number] DOB: 10-12-1941 Today's Date: 04/19/2022   History of Present Illness 81 yo female with onset of transient speech change was admitted to ED on 6/19, resulting in baseline recovery on 6/18.  Pt also reports a fall at her car in the last month, in which she was also evaluated in ED.  Cleared by MRI for changes in brain, neuro is going to eval for TIA.  PMHx:  DM, HTN, HLD, BMI 37, lumbar DJD, shingles, diverticulitis, CTS, DDD cervical spine, OA, osteoporosis, PNA, UTI, B TKA's    PT Comments    Patient progressing well towards goal of PT. Pt was pleasant and agreeable to session. Introduced a rollator for session and pt refused use stating "I don't need that, I only use it at home to sit and do the dishes when I get tired." Pt tolerated 200 ft of ambulation with stair training and balance activities. Pt is supervision for bed mobility for safety. Pt is minG for transfers for safety requiring HHA on IV pole to initially stand. During ambulation, pt was able to discontinue HHA on IV pole with no LOB. Pt tolerated head turns and direction changes during ambulation with no LOB. Pt was educated on safe stair navigation with rail on L, 2 x 2 stairs. Pt would continue to benefit from skilled PT to address balance deficits to increase independence and safety for d/c. Will continue to follow acutely.   Recommendations for follow up therapy are one component of a multi-disciplinary discharge planning process, led by the attending physician.  Recommendations may be updated based on patient status, additional functional criteria and insurance authorization.  Follow Up Recommendations  Outpatient PT     Assistance Recommended at Discharge Set up Supervision/Assistance  Patient can return home with the following A little help with walking and/or transfers;A little help with bathing/dressing/bathroom;Assistance with  cooking/housework;Help with stairs or ramp for entrance;Assist for transportation   Equipment Recommendations  None recommended by PT    Recommendations for Other Services       Precautions / Restrictions Precautions Precautions: Fall Restrictions Weight Bearing Restrictions: No     Mobility  Bed Mobility Overal bed mobility: Needs Assistance Bed Mobility: Sidelying to Sit, Rolling Rolling: Supervision Sidelying to sit: Supervision, HOB elevated       General bed mobility comments: pt supervision for safety and increased time    Transfers Overall transfer level: Needs assistance Equipment used: None Transfers: Sit to/from Stand Sit to Stand: Min guard           General transfer comment: EOB x1. min g for safety, brought rollator for transfers and ambulation--pt refused. from toilet x1 min g for safety and line management.    Ambulation/Gait Ambulation/Gait assistance: Supervision, Min guard Gait Distance (Feet): 200 Feet Assistive device: IV Pole, None Gait Pattern/deviations: Step-through pattern, Decreased stride length, Narrow base of support Gait velocity: reduced Gait velocity interpretation: <1.8 ft/sec, indicate of risk for recurrent falls   General Gait Details: pt with slow and steady gait speed. ambulated with 1 HHA on IV pole, able to discontinue and ambulate without AD and no LOB. supervision when using IV pole, regress to minG without IV pole for safety.   Stairs Stairs: Yes Stairs assistance: Supervision Stair Management: One rail Left, Alternating pattern, Forwards Number of Stairs: 2 (x 2) General stair comments: pt with supervision level assistance for safety, use of rail and alternating pattern with no c/o difficulty or  LOB.   Wheelchair Mobility    Modified Rankin (Stroke Patients Only)       Balance Overall balance assessment: Needs assistance Sitting-balance support: Feet supported Sitting balance-Leahy Scale: Good Sitting  balance - Comments: pt able to don socks at EOB without LOB. pt able to reach for tissue box outside BOS without LOB.   Standing balance support: No upper extremity supported, During functional activity, Single extremity supported Standing balance-Leahy Scale: Fair Standing balance comment: pt initially with 1 UE support required for standing, able to discontinue with no HHA and no LOB.             High level balance activites: Head turns, Sudden stops, Other (comment) High Level Balance Comments: pt asked to turn head to the horizontally and vertically, no LOB. pt asked to change speeds and directions quickly during ambulation with no LOB.            Cognition Arousal/Alertness: Awake/alert Behavior During Therapy: WFL for tasks assessed/performed Overall Cognitive Status: Within Functional Limits for tasks assessed                                 General Comments: pt agreeable to mobility        Exercises      General Comments        Pertinent Vitals/Pain Pain Assessment Pain Assessment: No/denies pain    Home Living                          Prior Function            PT Goals (current goals can now be found in the care plan section) Acute Rehab PT Goals Patient Stated Goal: to get home and recover her control of walking PT Goal Formulation: With patient/family Time For Goal Achievement: 05/02/22 Potential to Achieve Goals: Good Progress towards PT goals: Progressing toward goals    Frequency    Min 3X/week      PT Plan Current plan remains appropriate    Co-evaluation              AM-PAC PT "6 Clicks" Mobility   Outcome Measure  Help needed turning from your back to your side while in a flat bed without using bedrails?: A Little Help needed moving from lying on your back to sitting on the side of a flat bed without using bedrails?: A Little Help needed moving to and from a bed to a chair (including a wheelchair)?:  A Little Help needed standing up from a chair using your arms (e.g., wheelchair or bedside chair)?: A Little Help needed to walk in hospital room?: A Little Help needed climbing 3-5 steps with a railing? : A Little 6 Click Score: 18    End of Session Equipment Utilized During Treatment: Gait belt Activity Tolerance: Patient tolerated treatment well Patient left: in chair;with nursing/sitter in room (pt left in chair with nurse tech ready for shower.)   PT Visit Diagnosis: Unsteadiness on feet (R26.81);History of falling (Z91.81)     Time: 0825-0900 PT Time Calculation (min) (ACUTE ONLY): 35 min  Charges:  $Therapeutic Activity: 8-22 mins $Neuromuscular Re-education: 8-22 mins                     Havery Moros, MS, [place] Acute Rehabilitation Services Office: [place] 04/19/2022, 10:02 AM

## 2022-04-19 NOTE — Plan of Care (Signed)
Pt doing well. Family at bedside. Pt walked the hall and room. Pt pending discharge tomorrow. Problem: Education: Goal: Ability to describe self-care measures that may prevent or decrease complications (Diabetes Survival Skills Education) will improve Outcome: Progressing Goal: Individualized Educational Video(s) Outcome: Progressing   Problem: Coping: Goal: Ability to adjust to condition or change in health will improve Outcome: Progressing   Problem: Fluid Volume: Goal: Ability to maintain a balanced intake and output will improve Outcome: Progressing   Problem: Health Behavior/Discharge Planning: Goal: Ability to identify and utilize available resources and services will improve Outcome: Progressing Goal: Ability to manage health-related needs will improve Outcome: Progressing   Problem: Metabolic: Goal: Ability to maintain appropriate glucose levels will improve Outcome: Progressing   Problem: Nutritional: Goal: Maintenance of adequate nutrition will improve Outcome: Progressing Goal: Progress toward achieving an optimal weight will improve Outcome: Progressing   Problem: Skin Integrity: Goal: Risk for impaired skin integrity will decrease Outcome: Progressing   Problem: Tissue Perfusion: Goal: Adequacy of tissue perfusion will improve Outcome: Progressing   Problem: Education: Goal: Knowledge of General Education information will improve Description: Including pain rating scale, medication(s)/side effects and non-pharmacologic comfort measures Outcome: Progressing   Problem: Health Behavior/Discharge Planning: Goal: Ability to manage health-related needs will improve Outcome: Progressing   Problem: Clinical Measurements: Goal: Ability to maintain clinical measurements within normal limits will improve Outcome: Progressing Goal: Will remain free from infection Outcome: Progressing Goal: Diagnostic test results will improve Outcome: Progressing Goal:  Respiratory complications will improve Outcome: Progressing Goal: Cardiovascular complication will be avoided Outcome: Progressing   Problem: Activity: Goal: Risk for activity intolerance will decrease Outcome: Progressing   Problem: Nutrition: Goal: Adequate nutrition will be maintained Outcome: Progressing   Problem: Coping: Goal: Level of anxiety will decrease Outcome: Progressing   Problem: Elimination: Goal: Will not experience complications related to bowel motility Outcome: Progressing Goal: Will not experience complications related to urinary retention Outcome: Progressing   Problem: Pain Managment: Goal: General experience of comfort will improve Outcome: Progressing   Problem: Safety: Goal: Ability to remain free from injury will improve Outcome: Progressing   Problem: Skin Integrity: Goal: Risk for impaired skin integrity will decrease Outcome: Progressing   Problem: Education: Goal: Knowledge of disease or condition will improve Outcome: Progressing Goal: Knowledge of secondary prevention will improve ([facility] ALL) Outcome: Progressing   Problem: Ischemic Stroke/TIA Tissue Perfusion: Goal: Complications of ischemic stroke/TIA will be minimized Outcome: Progressing

## 2022-04-19 NOTE — Progress Notes (Signed)
SLP Cancellation Note  Patient Details Name: [name] MRN: [id number] DOB: 12-28-1940   Cancelled treatment:       Reason Eval/Treat Not Completed: SLP screened, no needs identified, will sign off MRI was negative and not no overt communication or cognitive-linguistic deficits were noted by PT/OT. A formal speech-language-cognition evaluation does not appear to be clinically indicated at this time. Those orders will be completed, but orders for swallowing are requested due to pt's report of her difficulty speaking being due to meat getting "stuck" in her throat.  Chastelyn [place] I. Hardin Negus, [place], [place] Office number [phone] Pager [place] 04/19/2022, 12:00 PM

## 2022-04-19 NOTE — Progress Notes (Signed)
PROGRESS NOTE    [name]  ATF:[id number] DOB: 27-Feb-1941 DOA: 04/17/2022  PCP: Cheryl Franco, FNP   Brief Narrative: This 81 years old female with PMH significant for type 2 diabetes, hypertension, hyperlipidemia, BMI 37 presented in the ED for evaluation of transient speech difficulty.  Patient reports that approximately lasted 10 minutes during afternoon she had trouble getting words out.  This has resolved and returned to baseline.  She denies any palpitations, headache, focal numbness weakness or change in vision.  CTA head negative for acute intracranial abnormality but notable for empty sella.  MRI is negative for acute or subacute infarction.  Neurology is consulted,  recommended TIA work-up.    Assessment & Plan:   Principal Problem:   Transient speech disturbance Active Problems:   Essential hypertension   Type 2 diabetes mellitus with stage 3 chronic kidney disease, without long-term current use of insulin (HCC)   Stage 3a chronic kidney disease (HCC)  TIA : Patient presented after brief episode of expressive aphasia. CT head and MRI : no acute findings noted. Patient passed swallow evaluation. Neurology recommended TIA work-up. CTA head and neck: No emergent finding, high-grade bilateral ECA origin stenosis TTE showed LVEF 50 to 55% and no shunt Lipid profile: LDL 98, hemoglobin A1c 6.4 Recommended DAPT (aspirin+ Plavix) for 21 days followed by Plavix only therapy.  Type 2 diabetes HbA1c 6.9 in 5/23 Continue regular insulin sliding scale  Hypertension: Allow Permissive hypertension as per neuro recommendation.   Hold blood pressure medications now  CKD stage IIIa: Serum creatinine 1.11, At baseline. Avoid nephrotoxic medications.  Obesity : Diet and exercise discussed in detail. Estimated body mass index is 37.12 kg/m as calculated from the following:   Height as of this encounter: '5\' 6"'$  (1.676 m).   Weight as of this encounter: 104.3 kg.    Obstructive sleep apnea: Patient has tested positive for sleep apnea on NO x 3 monitor and is participating in the sleep smart study. She will undergo CPAP mask tolerability test tonight   DVT prophylaxis: Lovenox Code Status: Full code. Family Communication: No family at bed side. Disposition Plan:  Status is: Observation The patient remains OBS appropriate and will d/c before 2 midnights.  Admitted for TIA work-up.  Patient underwent sleep apnea study which was positive, Patient will undergo CPAP mask tolerability test tonight.  Anticipated discharge 04/20/2022  Consultants:  Neurology  Procedures: CTA head and neck, TTE, sleep apnea study,  Antimicrobials: None   Subjective: Patient was seen and examined at bedside.  Overnight events noted.  Patient reports feeling better. Patient reports resolution of symptoms.  She had a sleep study test last night which is positive for sleep apnea. She needs CPAP tolerability test tonight.  Objective: Vitals:   04/18/22 2300 04/19/22 0357 04/19/22 0751 04/19/22 1218  BP: (!) 156/66 (!) 168/69 (!) 158/45 (!) 148/71  Pulse: 63 77 74 71  Resp:  '20 18 18  '$ Temp: 98.2 F (36.8 C) 98.1 F (36.7 C) 97.9 F (36.6 C) 98 F (36.7 C)  TempSrc: Oral Oral Oral   SpO2: 98% 97% 98% 98%  Weight:      Height:        Intake/Output Summary (Last 24 hours) at 04/19/2022 1348 Last data filed at 04/19/2022 1000 Gross per 24 hour  Intake 480 ml  Output 1500 ml  Net -1020 ml   Filed Weights   04/17/22 1236  Weight: 104.3 kg    Examination:  General exam: Appears  comfortable, not in any acute distress. Respiratory system: CTA bilaterally, no wheezing, no crackles, normal respiratory effort. Cardiovascular system: S1, S2 heard, regular rate and rhythm, no murmur. Gastrointestinal system: Abdomen is soft, non tender, non distended, BS+ Central nervous system: Alert and oriented X 3. No focal neurological deficits. Extremities: No edema, no  cyanosis, no clubbing Skin: No rashes, lesions or ulcers Psychiatry: Judgement and insight appear normal. Mood & affect appropriate.     Data Reviewed: I have personally reviewed following labs and imaging studies  CBC: Recent Labs  Lab 04/17/22 1302 04/18/22 0419 04/19/22 0225  WBC 10.1 11.0* 9.5  NEUTROABS 6.3  --   --   HGB 14.1 14.2 12.9  HCT 42.2 41.6 38.5  MCV 90.0 90.4 90.2  PLT 214 219 275   Basic Metabolic Panel: Recent Labs  Lab 04/17/22 1302  NA 140  K 3.9  CL 103  CO2 27  GLUCOSE 109*  BUN 23  CREATININE 1.11*  CALCIUM 10.2   GFR: Estimated Creatinine Clearance: 50.2 mL/min (A) (by C-G formula based on SCr of 1.11 mg/dL (H)). Liver Function Tests: Recent Labs  Lab 04/17/22 1302  AST 28  ALT 25  ALKPHOS 36*  BILITOT 1.1  PROT 7.5  ALBUMIN 4.2   No results for input(s): "LIPASE", "AMYLASE" in the last 168 hours. No results for input(s): "AMMONIA" in the last 168 hours. Coagulation Profile: Recent Labs  Lab 04/17/22 1302  INR 1.0   Cardiac Enzymes: No results for input(s): "CKTOTAL", "CKMB", "CKMBINDEX", "TROPONINI" in the last 168 hours. BNP (last 3 results) No results for input(s): "PROBNP" in the last 8760 hours. HbA1C: Recent Labs    04/18/22 0419  HGBA1C 6.4*   CBG: Recent Labs  Lab 04/18/22 0806 04/18/22 1207 04/18/22 1710 04/19/22 0656 04/19/22 1145  GLUCAP 95 99 134* 117* 116*   Lipid Profile: Recent Labs    04/18/22 0419  CHOL 176  HDL 40*  LDLCALC 98  TRIG 191*  CHOLHDL 4.4   Thyroid Function Tests: No results for input(s): "TSH", "T4TOTAL", "FREET4", "T3FREE", "THYROIDAB" in the last 72 hours. Anemia Panel: No results for input(s): "VITAMINB12", "FOLATE", "FERRITIN", "TIBC", "IRON", "RETICCTPCT" in the last 72 hours. Sepsis Labs: No results for input(s): "PROCALCITON", "LATICACIDVEN" in the last 168 hours.  No results found for this or any previous visit (from the past 240 hour(s)).   Radiology  Studies: ECHOCARDIOGRAM COMPLETE  Result Date: 04/18/2022    ECHOCARDIOGRAM REPORT   Patient Name:   [name] Date of Exam: 04/18/2022 Medical Rec #:  [id number]        Height:       66.0 in Accession #:    [id number]       Weight:       230.0 lb Date of Birth:  [birth year]         BSA:          2.122 m Patient Age:    67 years         BP:           146/65 mmHg Patient Gender: F                HR:           64 bpm. Exam Location:  Inpatient Procedure: 2D Echo, Cardiac Doppler, Color Doppler and Intracardiac            Opacification Agent Indications:    TIA G45.9  History:  Patient has no prior history of Echocardiogram examinations.  Sonographer:    Darlina Sicilian RDCS Referring Phys: [id number] [place]  1. Left ventricular ejection fraction, by estimation, is 50 to 55%. The left ventricle has low normal function. The left ventricle has no regional wall motion abnormalities. There is mild concentric left ventricular hypertrophy. Left ventricular diastolic parameters are indeterminate. Elevated left atrial pressure.  2. Right ventricular systolic function is normal. The right ventricular size is normal. Tricuspid regurgitation signal is inadequate for assessing PA pressure.  3. The mitral valve is grossly normal. No evidence of mitral valve regurgitation. Severe mitral annular calcification.  4. The aortic valve is tricuspid. There is moderate calcification of the aortic valve. There is mild thickening of the aortic valve. Aortic valve regurgitation is not visualized. Mild aortic valve stenosis. Aortic valve area, by VTI measures 1.57 cm. Aortic valve mean gradient measures 15.0 mmHg. DVI 0.31 with normal stroke volume index. Comparison(s): No prior Echocardiogram. FINDINGS  Left Ventricle: Left ventricular ejection fraction, by estimation, is 50 to 55%. The left ventricle has low normal function. The left ventricle has no regional wall motion abnormalities. Definity contrast agent was  given IV to delineate the left ventricular endocardial borders. The left ventricular internal cavity size was normal in size. There is mild concentric left ventricular hypertrophy. Left ventricular diastolic function could not be evaluated due to mitral annular calcification (moderate  or greater). Left ventricular diastolic parameters are indeterminate. Elevated left atrial pressure. Right Ventricle: The right ventricular size is normal. No increase in right ventricular wall thickness. Right ventricular systolic function is normal. Tricuspid regurgitation signal is inadequate for assessing PA pressure. Left Atrium: Left atrial size was normal in size. Right Atrium: Right atrial size was normal in size. Pericardium: There is no evidence of pericardial effusion. Mitral Valve: The mitral valve is grossly normal. Severe mitral annular calcification. No evidence of mitral valve regurgitation. Tricuspid Valve: The tricuspid valve is normal in structure. Tricuspid valve regurgitation is trivial. No evidence of tricuspid stenosis. Aortic Valve: The aortic valve is tricuspid. There is moderate calcification of the aortic valve. There is mild thickening of the aortic valve. Aortic valve regurgitation is not visualized. Mild aortic stenosis is present. Aortic valve mean gradient measures 15.0 mmHg. Aortic valve peak gradient measures 23.7 mmHg. Aortic valve area, by VTI measures 1.57 cm. Pulmonic Valve: The pulmonic valve was not well visualized. Pulmonic valve regurgitation is not visualized. Aorta: The aortic root and ascending aorta are structurally normal, with no evidence of dilitation. IAS/Shunts: No atrial level shunt detected by color flow Doppler.  LEFT VENTRICLE PLAX 2D LVIDd:         5.00 cm      Diastology LVIDs:         3.70 cm      LV e' medial:    4.80 cm/s LV PW:         1.10 cm      LV E/e' medial:  15.5 LV IVS:        1.20 cm      LV e' lateral:   4.20 cm/s LVOT diam:     2.20 cm      LV E/e' lateral: 17.7  LV SV:         85 LV SV Index:   40 LVOT Area:     3.80 cm  LV Volumes (MOD) LV vol d, MOD A2C: 117.0 ml LV vol d, MOD A4C: 110.0 ml LV vol s,  MOD A2C: 36.5 ml LV vol s, MOD A4C: 59.1 ml LV SV MOD A2C:     80.5 ml LV SV MOD A4C:     110.0 ml LV SV MOD BP:      67.0 ml RIGHT VENTRICLE RV S prime:     13.10 cm/s TAPSE (M-mode): 2.1 cm LEFT ATRIUM             Index        RIGHT ATRIUM           Index LA diam:        3.50 cm 1.65 cm/m   RA Area:     16.90 cm LA Vol (A2C):   29.0 ml 13.67 ml/m  RA Volume:   46.40 ml  21.86 ml/m LA Vol (A4C):   51.8 ml 24.41 ml/m LA Biplane Vol: 39.1 ml 18.42 ml/m  AORTIC VALVE AV Area (Vmax):    1.47 cm AV Area (Vmean):   1.53 cm AV Area (VTI):     1.57 cm AV Vmax:           243.33 cm/s AV Vmean:          170.000 cm/s AV VTI:            0.539 m AV Peak Grad:      23.7 mmHg AV Mean Grad:      15.0 mmHg LVOT Vmax:         94.20 cm/s LVOT Vmean:        68.600 cm/s LVOT VTI:          0.223 m LVOT/AV VTI ratio: 0.41  AORTA Ao Root diam: 3.40 cm Ao Asc diam:  3.20 cm MITRAL VALVE MV Area (PHT): 2.11 cm     SHUNTS MV Decel Time: 359 msec     Systemic VTI:  0.22 m MV E velocity: 74.40 cm/s   Systemic Diam: 2.20 cm MV A velocity: 127.00 cm/s MV E/A ratio:  0.59 Rudean Haskell MD Electronically signed by Rudean Haskell MD Signature Date/Time: 04/18/2022/4:19:54 PM    Final    CT ANGIO HEAD NECK W WO CM  Result Date: 04/18/2022 CLINICAL DATA:  Stroke follow-up EXAM: CT ANGIOGRAPHY HEAD AND NECK TECHNIQUE: Multidetector CT imaging of the head and neck was performed using the standard protocol during bolus administration of intravenous contrast. Multiplanar CT image reconstructions and MIPs were obtained to evaluate the vascular anatomy. Carotid stenosis measurements (when applicable) are obtained utilizing NASCET criteria, using the distal internal carotid diameter as the denominator. RADIATION DOSE REDUCTION: This exam was performed according to the departmental  dose-optimization program which includes automated exposure control, adjustment of the mA and/or kV according to patient size and/or use of iterative reconstruction technique. CONTRAST:  61m OMNIPAQUE IOHEXOL 350 MG/ML SOLN COMPARISON:  Brain MRI from yesterday FINDINGS: CTA NECK FINDINGS Aortic arch: Atheromatous plaque with 2 vessel arch. No acute finding Right carotid system: Diffuse atheromatous wall thickening of the common carotid with accentuated plaque deposition at the bifurcation and proximal ICA. No ulceration or CCA/ICA flow reducing stenosis. There is high-grade narrowing at the origin of the ECA. Left carotid system: Diffuse atheromatous wall thickening of the common carotid with accentuated mixed density plaque at the bifurcation and proximal ICA. No ulceration or CCA/ICA flow reducing stenosis. There is high-grade narrowing at the origin of the ECA. Vertebral arteries: No proximal subclavian stenosis. Calcified plaque at both vertebral origins with mild narrowing. Otherwise the vertebral arteries are smoothly contoured and widely patent  to the dura. Skeleton: Generalized cervical spine degeneration Other neck: No acute finding Upper chest: No acute finding Review of the MIP images confirms the above findings CTA HEAD FINDINGS Anterior circulation: Atheromatous calcification of the carotid siphons. No branch occlusion, beading, or aneurysm. Posterior circulation: Right dominant vertebral artery. Atheromatous plaque affecting the vertebral arteries. The vertebral and basilar arteries are widely patent. No branch occlusion, beading, or aneurysm Venous sinuses: Diffusely patent Anatomic variants: None significant Review of the MIP images confirms the above findings IMPRESSION: 1. No emergent finding. 2. Diffuse atherosclerosis. High-grade bilateral ECA origin stenosis. 3. No flow limiting stenosis in the intracranial circulation. Electronically Signed   By: Jorje Guild M.D.   On: 04/18/2022 05:10    MR BRAIN WO CONTRAST  Result Date: 04/17/2022 CLINICAL DATA:  Stroke suspected, difficulty speaking resolved EXAM: MRI HEAD WITHOUT CONTRAST TECHNIQUE: Multiplanar, multiecho pulse sequences of the brain and surrounding structures were obtained without intravenous contrast. COMPARISON:  No prior MRI, correlation is made with CT head 04/17/2022 FINDINGS: Brain: No restricted diffusion to suggest acute or subacute infarct. No acute hemorrhage, mass mass effect, or midline shift. No hydrocephalus or extra-axial collection. Partial empty sella. Minimally low lying cerebellar tonsils, which extend 1-2 mm below the foramen magnum, which does not qualify as cerebellar ectopia or Chiari malformation. Scattered foci of hemosiderin deposition, most prominently in the parietal and occipital lobes, most likely sequela of prior hypertensive microhemorrhages. Scattered and confluent T2 hyperintense signal in the periventricular white matter, likely the sequela of moderate chronic small vessel ischemic disease. Vascular: Normal arterial flow voids. Skull and upper cervical spine: Normal marrow signal. Sinuses/Orbits: No acute finding. Status post bilateral lens replacements. Other: The mastoids are well aerated. IMPRESSION: No acute intracranial process. No evidence of acute or subacute infarct. Electronically Signed   By: Merilyn Baba M.D.   On: 04/17/2022 21:34   CT HEAD WO CONTRAST  Result Date: 04/17/2022 CLINICAL DATA:  Neuro deficit, acute, stroke suspected. Pt arrives to ED with c/o aphasia. Pt reports that yesterday at 4pm pt experienced a 10 minute episode of not being able to speak. She reports she is back at her baseline. EXAM: CT HEAD WITHOUT CONTRAST TECHNIQUE: Contiguous axial images were obtained from the base of the skull through the vertex without intravenous contrast. RADIATION DOSE REDUCTION: This exam was performed according to the departmental dose-optimization program which includes automated  exposure control, adjustment of the mA and/or kV according to patient size and/or use of iterative reconstruction technique. COMPARISON:  CT head 03/02/2022 BRAIN: BRAIN Patchy and confluent areas of decreased attenuation are noted throughout the deep and periventricular white matter of the cerebral hemispheres bilaterally, compatible with chronic microvascular ischemic disease. No evidence of large-territorial acute infarction. No parenchymal hemorrhage. No mass lesion. No extra-axial collection. No mass effect or midline shift. No hydrocephalus. Basilar cisterns are patent. Empty sella. Vascular: No hyperdense vessel. Atherosclerotic calcifications are present within the cavernous internal carotid and vertebral arteries. Skull: No acute fracture or focal lesion. Sinuses/Orbits: Paranasal sinuses and mastoid air cells are clear. Bilateral lens replacement. Otherwise the orbits are unremarkable. Other: None. IMPRESSION: 1.  No acute intracranial abnormality. 2. Empty sella. Findings is often a normal anatomic variant but can be associated with idiopathic intracranial hypertension (pseudotumor cerebri). Electronically Signed   By: Iven Finn M.D.   On: 04/17/2022 18:25     Scheduled Meds:  aspirin EC  81 mg Oral Daily   clopidogrel  75 mg Oral Daily  darifenacin  7.5 mg Oral Daily   diclofenac Sodium  4 g Topical TID AC & HS   enoxaparin (LOVENOX) injection  40 mg Subcutaneous Daily   insulin aspart  0-6 Units Subcutaneous TID WC   rosuvastatin  20 mg Oral Daily   Continuous Infusions:   LOS: 0 days    Time spent: 50 mins    Leandria Thier, MD [facility]   If 7PM-7AM, please contact night-coverage

## 2022-04-19 NOTE — TOC Transition Note (Signed)
Transition of Care [facility]) - CM/SW Discharge Note   Patient Details  Name: [name] MRN: [id number] Date of Birth: Mar 21, 1941  [facility] CM/SW Contact:  Pollie Friar, RN Phone Number: 04/19/2022, 11:52 AM   Clinical Narrative:    Patient is from home with her spouse. She still runs a company. She drives herself and manages her own medications at home.  Recommendations are for outpatient PT. Pt currently is refusing outpatient PT. She doesn't feel she needs this therapy.  Pt has supervision at home and transportation to home once d/ced.    Final next level of care: Home/Self Care Barriers to Discharge: No Barriers Identified   Patient Goals and CMS Choice        Discharge Placement                       Discharge Plan and Services                                     Social Determinants of Health (SDOH) Interventions     Readmission Risk Interventions     No data to display

## 2022-04-19 NOTE — Consult Note (Signed)
   [facility] Inpatient Consult   04/19/2022  [name] 03-Apr-1941 [id number]  [employer] [place] [facility] [AC[facility]] Patient: [name]  Alerted of admission by Luther Bradley LCSW with [facility] [[facility]]  *Patient in observation status  Primary Care Provider:  Tilda Franco, FNP, is with [facility], is listed as PCP in [place]. This is not a [facility] provider at this time.  Patient was screened for needs for chronic care management as she had been active with the team in the CCM program at [facility].  Met with patient at the bedside to follow up on hospitalization and to  confirm PCP and she endorses [name], [name] with Rom[facility] Starcher.  Explained reason for my visit on rounds.  We spoke at length regarding her hospitalization and ongoing testing needs. Gave the patient an appointment reminder card and a 24 hour nurse advise line number magnet.  Vendor came to fit patient for CPAP.  Plan: Will update referring source of new PCP.  No additional needs assessed.  Please contact for further questions,  Natividad Brood, RN BSN [facility] Liaison  [phone] business mobile phone Toll free office [phone]  Fax number: [phone] [email or web address]

## 2022-04-19 NOTE — Evaluation (Signed)
Occupational Therapy Evaluation Patient Details Name: [name] MRN: [id number] DOB: [birth year] Today's Date: 04/19/2022   History of Present Illness 81 yo female with onset of transient speech change was admitted to ED on 6/19, resulting in baseline recovery on 6/18.  Pt also reports a fall at her car in the last month, in which she was also evaluated in ED.  MRI (-); workup for TIA.  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 Pt at her baseline regarding ADL and functional mobility for ADL. Educated pt on signs and symptoms of CVA suing [employer]. No further OT needed. OT signing off.      Recommendations for follow up therapy are one component of a multi-disciplinary discharge planning process, led by the attending physician.  Recommendations may be updated based on patient status, additional functional criteria and insurance authorization.   Follow Up Recommendations  No OT follow up    Assistance Recommended at Discharge PRN  Patient can return home with the following Assist for transportation    Functional Status Assessment  Patient has not had a recent decline in their functional status  Equipment Recommendations  None recommended by OT    Recommendations for Other Services       Precautions / Restrictions Precautions Precautions: Fall Precaution Comments: recent fall in parking lot prior to this admit Restrictions Weight Bearing Restrictions: No      Mobility Bed Mobility Overal bed mobility: Modified Independent                  Transfers Overall transfer level: Modified independent                        Balance Overall balance assessment: Mild deficits observed, not formally tested                              High Level Balance Comments: pt asked to turn head to the horizontally and vertically, no LOB. pt asked to change speeds and directions quickly during ambulation with no LOB.           ADL either performed or assessed with clinical judgement   ADL Overall ADL's : At baseline                                             Vision Baseline Vision/History: 0 No visual deficits Vision Assessment?: No apparent visual deficits     Perception     Praxis      Pertinent Vitals/Pain Pain Assessment Pain Assessment: No/denies pain     Hand Dominance Right   Extremity/Trunk Assessment Upper Extremity Assessment Upper Extremity Assessment: Overall WFL for tasks assessed   Lower Extremity Assessment Lower Extremity Assessment: Defer to PT evaluation   Cervical / Trunk Assessment Cervical / Trunk Assessment: Normal (mild change)   Communication Communication Communication: No difficulties   Cognition Arousal/Alertness: Awake/alert Behavior During Therapy: WFL for tasks assessed/performed Overall Cognitive Status: Within Functional Limits for tasks assessed  General Comments       Exercises Exercises:  (4+ strength on L knee)   Shoulder Instructions      Home Living Family/patient expects to be discharged to:: Private residence Living Arrangements: Spouse/significant other;Children Available Help at Discharge: Family;Available 24 hours/day Type of Home: House Home Access: Stairs to enter [employer] of Steps: 3 Entrance Stairs-Rails: Left Home Layout: One level     Bathroom Shower/Tub: Occupational psychologist: Standard Bathroom Accessibility: Yes How Accessible: Accessible via walker Home Equipment: Rollator (4 wheels);Cane - single point;Crutches;Shower seat;Grab bars - tub/shower   Additional Comments: home with family assist, husband is very helpful       Prior Functioning/Environment Prior Level of Function : Independent/Modified Independent;Driving;Working/employed (not currently driving due to recent cataract surgery)                        OT Problem List: Obesity      OT Treatment/Interventions:      OT Goals(Current goals can be found in the care plan section) Acute Rehab OT Goals Patient Stated Goal: to go home OT Goal Formulation: All assessment and education complete, DC therapy  OT Frequency:      Co-evaluation              AM-PAC OT "6 Clicks" Daily Activity     Outcome Measure Help from another person eating meals?: None Help from another person taking care of personal grooming?: None Help from another person toileting, which includes using toliet, bedpan, or urinal?: None Help from another person bathing (including washing, rinsing, drying)?: None Help from another person to put on and taking off regular upper body clothing?: None Help from another person to put on and taking off regular lower body clothing?: None 6 Click Score: 24   End of Session Nurse Communication: Other (comment) (no needs)  Activity Tolerance: Patient tolerated treatment well Patient left: Other (comment) (in bathroon)  OT Visit Diagnosis: Unsteadiness on feet (R26.81)                Time: 6979-4801 OT Time Calculation (min): 14 min Charges:  OT General Charges $OT Visit: 1 Visit OT Evaluation $OT Eval Low Complexity: [place], OT/L   Acute OT Clinical Specialist Acute Rehabilitation Services Pager [phone] Office [phone]   [facility] 04/19/2022, 10:08 AM

## 2022-04-19 NOTE — Care Management Obs Status (Signed)
[place] NOTIFICATION   Patient Details  Name: KYMIAH ARAIZA MRN: [id number] Date of Birth: 1941/05/23   Medicare Observation Status Notification Given:  Yes    Pollie Friar, RN 04/19/2022, 11:03 AM

## 2022-04-19 NOTE — Chronic Care Management (AMB) (Cosign Needed)
  Care Management   Follow Up Note   04/19/2022 Name: [name] MRN: [id number] DOB: Jul 09, 1941   Referred by: Tilda Franco, FNP Reason for referral : Chronic Care Management (Case Closure)   Received notification from [place], [facility] hospital liaison, this patient has transitioned to a new PCP.    Follow Up Plan: No further follow up required: Closed patient from [place], RN, BSN, CCM Care Management Coordinator [facility]  Direct Phone: [phone]

## 2022-04-20 ENCOUNTER — Observation Stay (HOSPITAL_COMMUNITY): Payer: Medicare Other

## 2022-04-20 DIAGNOSIS — R059 Cough, unspecified: Secondary | ICD-10-CM | POA: Diagnosis not present

## 2022-04-20 DIAGNOSIS — R479 Unspecified speech disturbances: Secondary | ICD-10-CM | POA: Diagnosis not present

## 2022-04-20 DIAGNOSIS — R4701 Aphasia: Secondary | ICD-10-CM | POA: Diagnosis not present

## 2022-04-20 LAB — GLUCOSE, CAPILLARY
Glucose-Capillary: 136 mg/dL — ABNORMAL HIGH (ref 70–99)
Glucose-Capillary: 111 mg/dL — ABNORMAL HIGH (ref 70–99)

## 2022-04-20 LAB — BASIC METABOLIC PANEL
Anion gap: 9 (ref 5–15)
BUN: 30 mg/dL — ABNORMAL HIGH (ref 8–23)
CO2: 22 mmol/L (ref 22–32)
Calcium: 8.8 mg/dL — ABNORMAL LOW (ref 8.9–10.3)
Chloride: 107 mmol/L (ref 98–111)
Creatinine, Ser: 1.03 mg/dL — ABNORMAL HIGH (ref 0.44–1.00)
GFR, Estimated: 55 mL/min — ABNORMAL LOW (ref >60)
Glucose, Bld: 107 mg/dL — ABNORMAL HIGH (ref 70–99)
Potassium: 4.2 mmol/L (ref 3.5–5.1)
Sodium: 138 mmol/L (ref 135–145)

## 2022-04-20 IMAGING — RF DG ESOPHAGUS
7 of 9 series · 13 of 24 positions shown · non-contrast
Comparison: None

CLINICAL DATA: Aphasia, cough

EXAM:
ESOPHAGUS/BARIUM SWALLOW/TABLET STUDY
TECHNIQUE: Combined double and single contrast examination was performed using
effervescent crystals, high-density barium, and thin liquid barium.
This exam was performed by JAYQUAN, PA, and was supervised
and interpreted by JAYQUAN.
FLUOROSCOPY:
Radiation Exposure Index (as provided by the fluoroscopic device):
54.30 mGy K[place]a

[Series 2: fluoro_barium 2fps_bw · 0.17mm/px · 1 of 1 slices shown (1 of 4)]
[im 1/1]
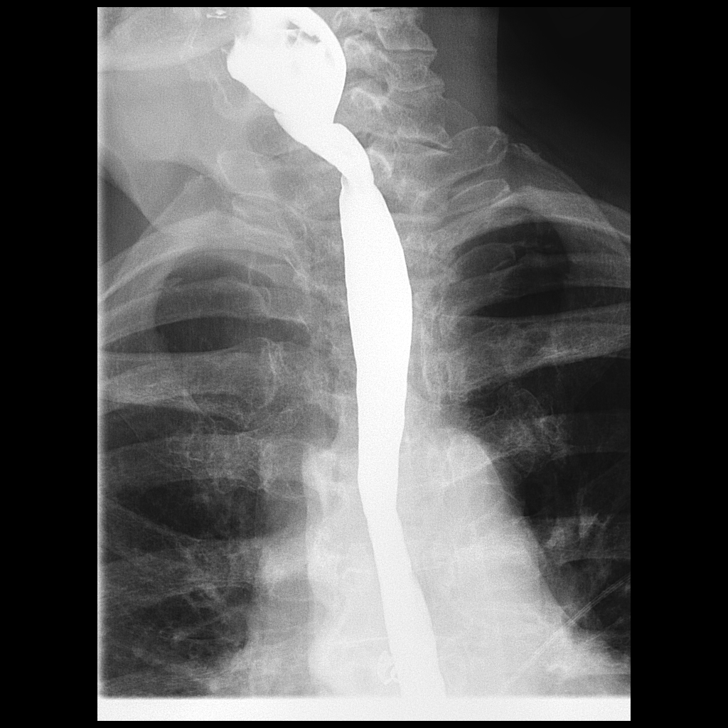

[Series 4: fluoro_barium 2fps_bw · 0.17mm/px · 2 of 3 frames shown (2 of 4)]
[frame 1/3]
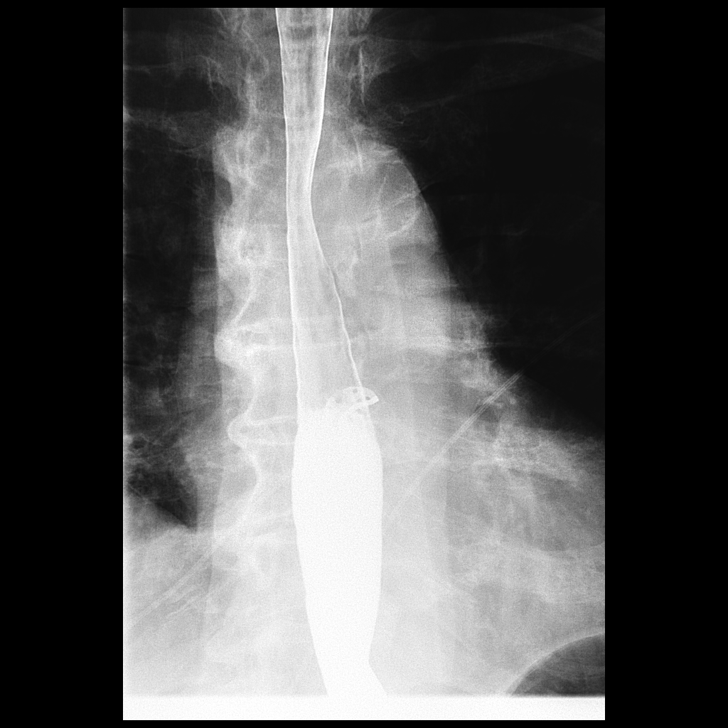
[frame 3/3]
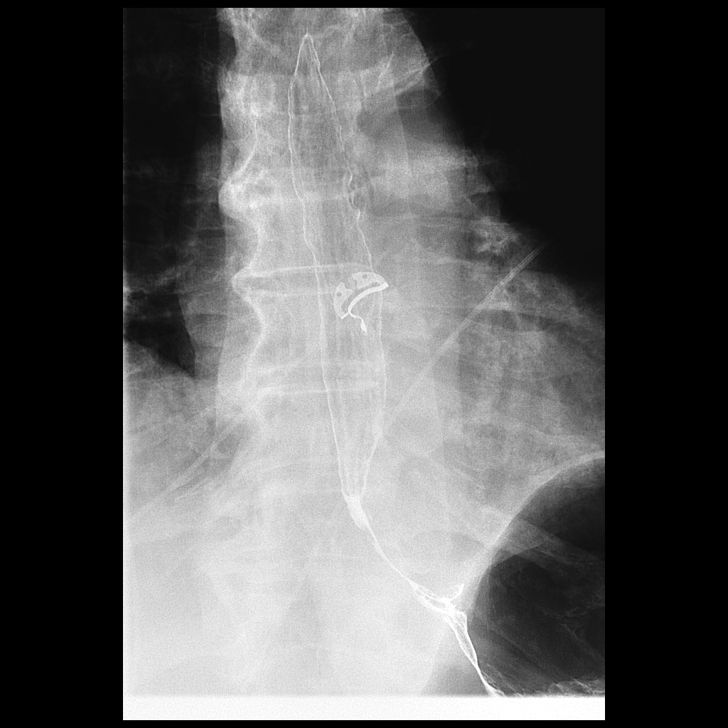

[Series 5: fluoro_barium 2fps_bw · 0.17mm/px · 2 of 4 frames shown (3 of 4)]
[frame 2/4]
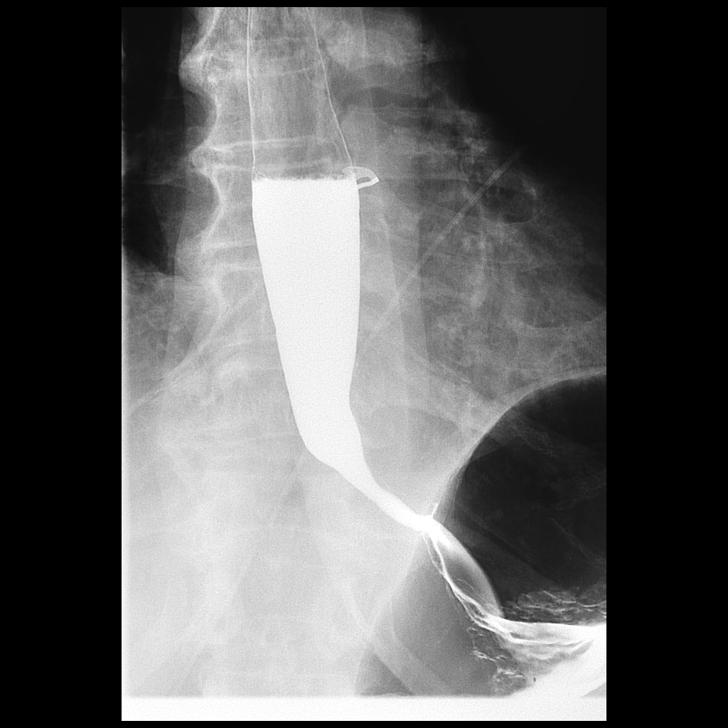
[frame 4/4]
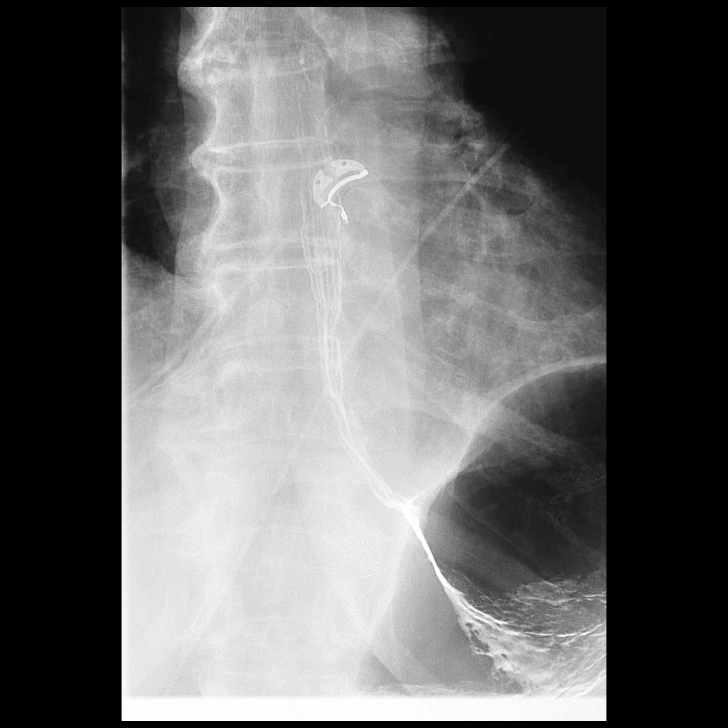

[Series 6: fluoro_barium 2fps_bw · 0.17mm/px · 1 of 5 frames shown (4 of 4)]
[frame 3/5]
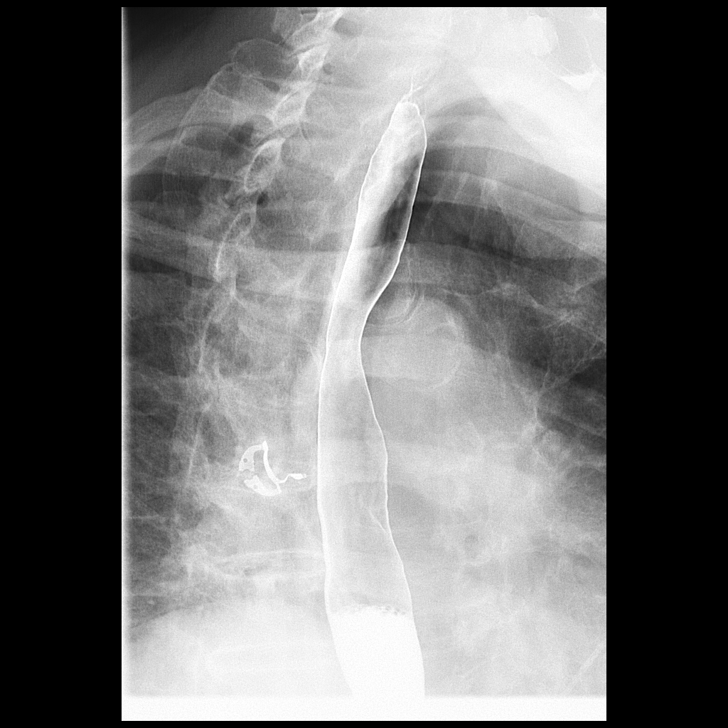

[Series 8: cp_standard · 0.34mm/px · 3 of 108 frames shown (1 of 3)]
[frame 17/108]
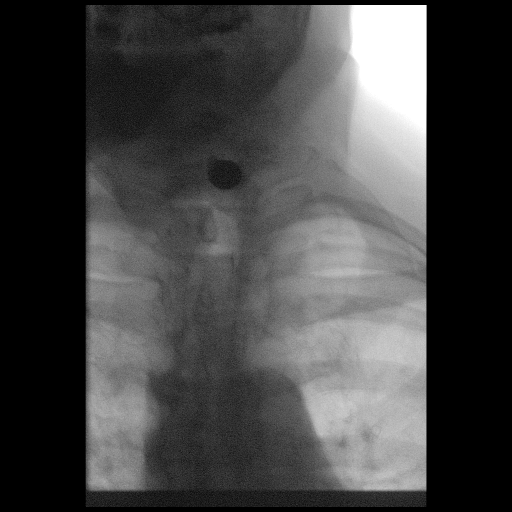
[frame 26/108]
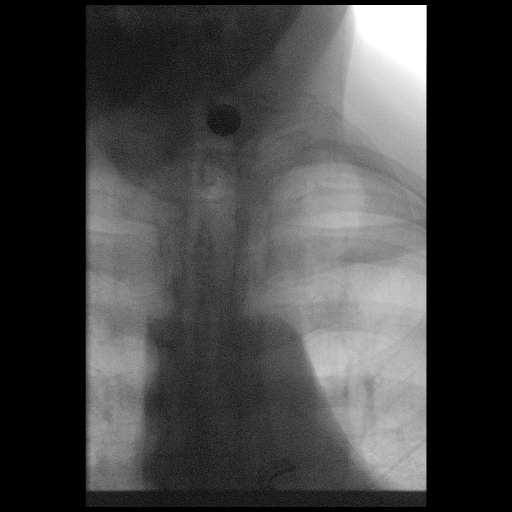
[frame 92/108]
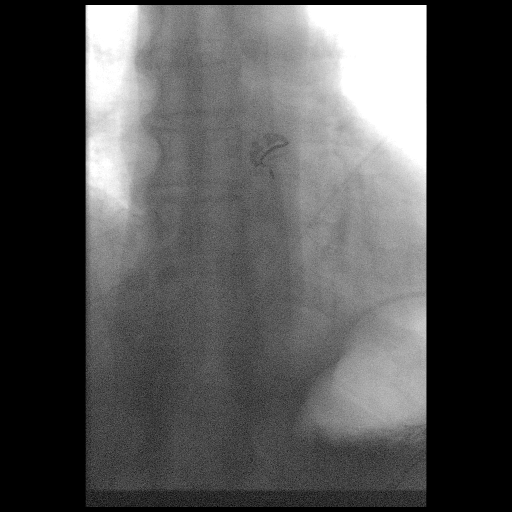

[Series 10: cp_standard · 0.37mm/px · 2 of 155 frames shown (2 of 3)]
[frame 24/155]
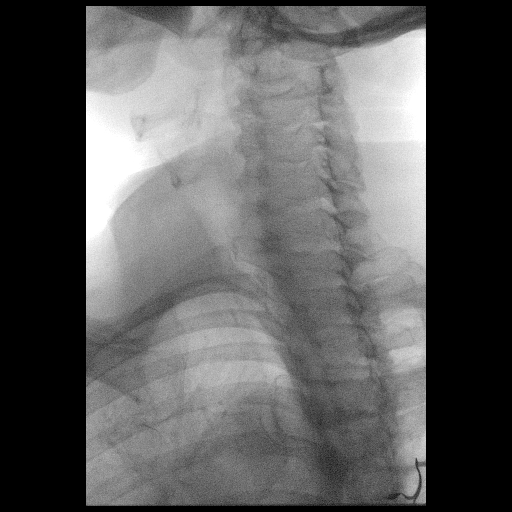
[frame 132/155]
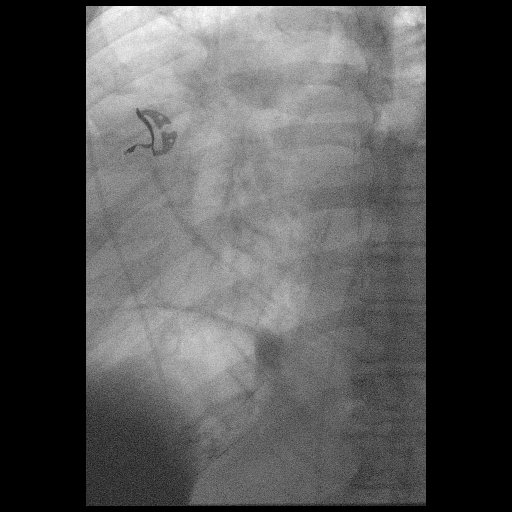

[Series 11: cp_standard · 0.37mm/px · 2 of 4 frames shown (3 of 3)]
[frame 1/4]
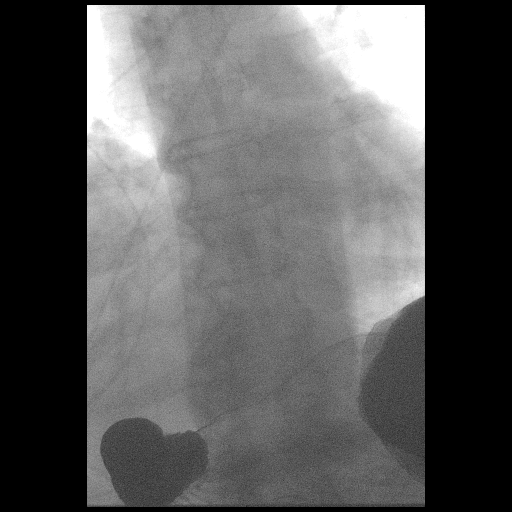
[frame 4/4]
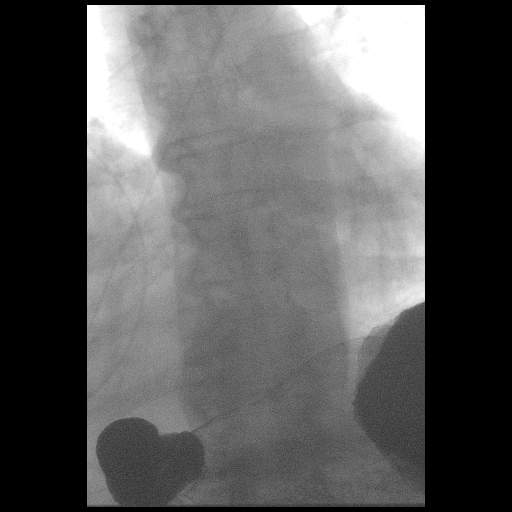

[13 of 24 positions shown; findings below may reference images not displayed]

FINDINGS: Swallowing: Appears normal. No vestibular penetration or aspiration
seen.

Pharynx: Unremarkable.

Esophagus: No mass or stricture.  Mucosa is unremarkable.

Esophageal motility: Within normal limits.

Hiatal Hernia: None.

Gastroesophageal reflux: None visualized.

Ingested 13mm barium tablet: Passed normally
IMPRESSION: No significant abnormality.

## 2022-04-20 MED ORDER — CLOPIDOGREL BISULFATE 75 MG PO TABS
75.0000 mg | ORAL_TABLET | Freq: Every day | ORAL | Status: DC
  Administered 2022-04-20: 75 mg via ORAL
  Filled 2022-04-20: qty 1

## 2022-04-20 MED ORDER — CLOPIDOGREL BISULFATE 75 MG PO TABS: 75.0000 mg | ORAL_TABLET | Freq: Every day | ORAL | 3 refills | Status: DC

## 2022-04-20 NOTE — Discharge Instructions (Signed)
Advised to follow-up with primary care physician in 1 week. Advised to follow-up with neurology as scheduled Advised to take dual antiplatelet therapy aspirin+ Plavix for 3 months followed by Plavix only therapy and extensive risk factor modification. Advised to follow-up with sleep specialist for CPAP.

## 2022-04-20 NOTE — Discharge Summary (Addendum)
Physician Discharge Summary  [name] WCB:[id number] DOB: 1940-11-08 DOA: 04/17/2022  PCP: Tilda Franco, FNP  Admit date: 04/17/2022  Discharge date: 04/20/2022  Admitted From: Home.  Disposition: Home.  Recommendations for Outpatient Follow-up:  Follow up with PCP in 1-2 weeks. Please obtain BMP/CBC in one week. Advised to follow-up with neurology as scheduled Advised to take dual antiplatelet therapy ( aspirin+ Plavix )for 3 weeks followed by Plavix only therapy and risk factor modification. Advised to follow-up with sleep specialist for CPAP.  Home Health: None Equipment/Devices:None  Discharge Condition: Stable CODE STATUS:Full code Diet recommendation: Heart Healthy   Brief [facility] Course: This 81 years old female with PMH significant for type 2 diabetes, hypertension, hyperlipidemia, BMI 37 presented in the ED for evaluation of transient speech difficulty. Patient reports that approximately lasted 10 minutes during afternoon,  she had trouble getting words out. This has resolved and returned to baseline. She denies any palpitations, headache, focal numbness, weakness or change in vision. CTA head negative for acute intracranial abnormality but notable for empty sella.  MRI is negative for acute or subacute infarction.  Neurology is consulted,  recommended TIA work-up.  Patient was admitted for TIA work-up.  CT and MRI unremarkable.  CTA head and neck no emergent findings.  TTE shows LVEF 50 to 55% and no shunt.  Neurology recommended dual antiplatelet therapy  (aspirin plus Plavix) for 3 weeks followed by Plavix only therapy and aggressive risk factor modification.  Patient tested positive for sleep apnea, underwent CPAP mask tolerability test and she is randomized to CPAP treatment.  Patient is cleared from neurology to be discharged.  Discharge Diagnoses:  Principal Problem:   Transient speech disturbance Active Problems:   Essential hypertension   Type 2  diabetes mellitus with stage 3 chronic kidney disease, without long-term current use of insulin (HCC)   Stage 3a chronic kidney disease (HCC)  TIA : Patient presented after brief episode of expressive aphasia. CT head and MRI : no acute findings noted. Patient passed swallow evaluation. Neurology recommended TIA work-up. CTA head and neck: No emergent finding, high-grade bilateral ECA origin stenosis TTE showed LVEF 50 to 55% and no shunt Lipid profile: LDL 98, hemoglobin A1c 6.4 Recommended DAPT (aspirin+ Plavix) for 21 days followed by Plavix only therapy.   Type 2 diabetes HbA1c 6.9 in 5/23 Continue regular insulin sliding scale   Hypertension: Allow Permissive hypertension as per neuro recommendation.   Hold blood pressure medications now   CKD stage IIIa: Serum creatinine 1.11, At baseline. Avoid nephrotoxic medications.   Obesity : Diet and exercise discussed in detail. Estimated body mass index is 37.12 kg/m as calculated from the following:   Height as of this encounter: '5\' 6"'  (1.676 m).   Weight as of this encounter: 104.3 kg.    Obstructive sleep apnea: Patient has tested positive for sleep apnea on NO x 3 monitor and is participating in the sleep smart study. She will undergo CPAP mask tolerability test tonight.  She is enrolled in CPAP study.  Discharge Instructions  Discharge Instructions     Call MD for:  difficulty breathing, headache or visual disturbances   Complete by: As directed    Call MD for:  persistant nausea and vomiting   Complete by: As directed    Diet - low sodium heart healthy   Complete by: As directed    Diet Carb Modified   Complete by: As directed    Discharge instructions   Complete by:  As directed    Advised to follow-up with primary care physician in 1 week. Advised to follow-up with neurology as scheduled Advised to take dual antiplatelet therapy aspirin+ Plavix for 3 months followed by Plavix only therapy and extensive risk  factor modification. Advised to follow-up with sleep specialist for CPAP.   Increase activity slowly   Complete by: As directed       Allergies as of 04/20/2022       Reactions   Bee Venom Hives   Hydrocodone-acetaminophen Other (See Comments)   hallucinations hallucinations   Latex Hives, Itching   Oxycodone Other (See Comments)   hallucinations   Vicodin [hydrocodone-acetaminophen] Other (See Comments)   hallucinations        Medication List     STOP taking these medications    amoxicillin 500 MG capsule Commonly known as: AMOXIL   benzonatate 100 MG capsule Commonly known as: Tessalon Perles   lidocaine 5 % Commonly known as: Lidoderm       TAKE these medications    allopurinol 300 MG tablet Commonly known as: ZYLOPRIM Take 1 tablet by mouth daily   aspirin EC 81 MG tablet Take 1 tablet (81 mg total) by mouth daily.   atenolol-chlorthalidone 50-25 MG tablet Commonly known as: TENORETIC Take 1 tablet by mouth daily.   bisacodyl 5 MG EC tablet Commonly known as: DULCOLAX Take 5 mg by mouth daily as needed for moderate constipation.   Blood Pressure Kit 1 Units by Does not apply route daily. Check blood pressure daily for hypertension dx code I10   clopidogrel 75 MG tablet Commonly known as: PLAVIX Take 1 tablet (75 mg total) by mouth daily. Start taking on: April 21, 2022   diclofenac Sodium 1 % Gel Commonly known as: Voltaren Apply 4 g topically 4 (four) times daily. Apply to back   hydrocortisone cream 1 % Apply to affected area 2 times daily   Magnesium 250 MG Tabs Take 1 tablet (250 mg total) by mouth every evening. Take with dinner meal   multivitamin tablet Take 1 tablet by mouth daily.   OneTouch Delica Plus [id number] Misc USE TO CHECK BLOOD SUGAR TWICE DAILY   OneTouch Verio test strip Generic drug: glucose blood USE TO TEST 2 TIMES DAILY   Probiotic 250 MG Caps 1 tablet by mouth daily   rosuvastatin 20 MG tablet Commonly  known as: CRESTOR TAKE 1 TABLET(20 MG) BY MOUTH DAILY What changed:  how much to take how to take this when to take this   solifenacin 5 MG tablet Commonly known as: VESICARE Take 5 mg by mouth every morning.   tiZANidine 4 MG tablet Commonly known as: Zanaflex Take 0.5 tablets (2 mg total) by mouth every 6 (six) hours as needed for muscle spasms.   Trulicity 2.72 ZD/6.6YQ Sopn Generic drug: Dulaglutide Inject 0.5 mg into the skin every 14 (fourteen) days.   valsartan 80 MG tablet Commonly known as: Diovan Take 1 tablet (80 mg total) by mouth daily.   vitamin B-12 100 MCG tablet Commonly known as: CYANOCOBALAMIN Take 1 tablet by mouth daily   Vitamin D (Ergocalciferol) 1.25 MG (50000 UNIT) Caps capsule Commonly known as: DRISDOL TAKE ONE CAPSULE BY MOUTH ONCE WEEKLY BEFORE BREAKFAST ON  TUESDAYS and TAKE ONE CAPSULE BY MOUTH ONCE WEEKLY BEFORE BREAKFAST ON FRIDAYS What changed: when to take this   ZINC PO Take 1 tablet by mouth daily.        Follow-up Information  Tilda Franco, FNP Follow up in 1 week(s).   Specialty: Family Medicine Contact information: [address], [address] [place] [address] [phone]         Garvin Fila, MD Follow up in 4 week(s).   Specialties: Neurology, Radiology Contact information: [address] [place] [address] [phone]                Allergies  Allergen Reactions   Bee Venom Hives   Hydrocodone-Acetaminophen Other (See Comments)    hallucinations hallucinations   Latex Hives and Itching   Oxycodone Other (See Comments)    hallucinations   Vicodin [Hydrocodone-Acetaminophen] Other (See Comments)    hallucinations    Consultations: Neurology   Procedures/Studies: DG ESOPHAGUS W SINGLE CM (SOL OR THIN BA)  Result Date: 04/20/2022 CLINICAL DATA:  Aphasia, cough EXAM: ESOPHAGUS/BARIUM SWALLOW/TABLET STUDY TECHNIQUE: Combined double and single contrast examination  was performed using effervescent crystals, high-density barium, and thin liquid barium. This exam was performed by Pasty Spillers, PA, and was supervised and interpreted by Macy Mis, MD. FLUOROSCOPY: Radiation Exposure Index (as provided by the fluoroscopic device): 54.30 mGy Kerma COMPARISON:  None FINDINGS: Swallowing: Appears normal. No vestibular penetration or aspiration seen. Pharynx: Unremarkable. Esophagus: No mass or stricture.  Mucosa is unremarkable. Esophageal motility: Within normal limits. Hiatal Hernia: None. Gastroesophageal reflux: None visualized. Ingested 71m barium tablet: Passed normally IMPRESSION: No significant abnormality. Electronically Signed   By: PMacy MisM.D.   On: 04/20/2022 12:22   ECHOCARDIOGRAM COMPLETE  Result Date: 04/18/2022    ECHOCARDIOGRAM REPORT   Patient Name:   JANNLOUISE GERETYDate of Exam: 04/18/2022 Medical Rec #:  [id number]       Height:       66.0 in Accession #:    [id number]      Weight:       230.0 lb Date of Birth:  [birth year]        BSA:          2.122 m Patient Age:    77years         BP:           146/65 mmHg Patient Gender: F                HR:           64 bpm. Exam Location:  Inpatient Procedure: 2D Echo, Cardiac Doppler, Color Doppler and Intracardiac            Opacification Agent Indications:    TIA G45.9  History:        Patient has no prior history of Echocardiogram examinations.  Sonographer:    TDarlina SicilianRDCS Referring Phys: [id number] 1. Left ventricular ejection fraction, by estimation, is 50 to 55%. The left ventricle has low normal function. The left ventricle has no regional wall motion abnormalities. There is mild concentric left ventricular hypertrophy. Left ventricular diastolic parameters are indeterminate. Elevated left atrial pressure.  2. Right ventricular systolic function is normal. The right ventricular size is normal. Tricuspid regurgitation signal is inadequate for assessing PA pressure.   3. The mitral valve is grossly normal. No evidence of mitral valve regurgitation. Severe mitral annular calcification.  4. The aortic valve is tricuspid. There is moderate calcification of the aortic valve. There is mild thickening of the aortic valve. Aortic valve regurgitation is not visualized. Mild aortic valve stenosis. Aortic valve area, by  VTI measures 1.57 cm. Aortic valve mean gradient measures 15.0 mmHg. DVI 0.31 with normal stroke volume index. Comparison(s): No prior Echocardiogram. FINDINGS  Left Ventricle: Left ventricular ejection fraction, by estimation, is 50 to 55%. The left ventricle has low normal function. The left ventricle has no regional wall motion abnormalities. Definity contrast agent was given IV to delineate the left ventricular endocardial borders. The left ventricular internal cavity size was normal in size. There is mild concentric left ventricular hypertrophy. Left ventricular diastolic function could not be evaluated due to mitral annular calcification (moderate  or greater). Left ventricular diastolic parameters are indeterminate. Elevated left atrial pressure. Right Ventricle: The right ventricular size is normal. No increase in right ventricular wall thickness. Right ventricular systolic function is normal. Tricuspid regurgitation signal is inadequate for assessing PA pressure. Left Atrium: Left atrial size was normal in size. Right Atrium: Right atrial size was normal in size. Pericardium: There is no evidence of pericardial effusion. Mitral Valve: The mitral valve is grossly normal. Severe mitral annular calcification. No evidence of mitral valve regurgitation. Tricuspid Valve: The tricuspid valve is normal in structure. Tricuspid valve regurgitation is trivial. No evidence of tricuspid stenosis. Aortic Valve: The aortic valve is tricuspid. There is moderate calcification of the aortic valve. There is mild thickening of the aortic valve. Aortic valve regurgitation is not  visualized. Mild aortic stenosis is present. Aortic valve mean gradient measures 15.0 mmHg. Aortic valve peak gradient measures 23.7 mmHg. Aortic valve area, by VTI measures 1.57 cm. Pulmonic Valve: The pulmonic valve was not well visualized. Pulmonic valve regurgitation is not visualized. Aorta: The aortic root and ascending aorta are structurally normal, with no evidence of dilitation. IAS/Shunts: No atrial level shunt detected by color flow Doppler.  LEFT VENTRICLE PLAX 2D LVIDd:         5.00 cm      Diastology LVIDs:         3.70 cm      LV e' medial:    4.80 cm/s LV PW:         1.10 cm      LV E/e' medial:  15.5 LV IVS:        1.20 cm      LV e' lateral:   4.20 cm/s LVOT diam:     2.20 cm      LV E/e' lateral: 17.7 LV SV:         85 LV SV Index:   40 LVOT Area:     3.80 cm  LV Volumes (MOD) LV vol d, MOD A2C: 117.0 ml LV vol d, MOD A4C: 110.0 ml LV vol s, MOD A2C: 36.5 ml LV vol s, MOD A4C: 59.1 ml LV SV MOD A2C:     80.5 ml LV SV MOD A4C:     110.0 ml LV SV MOD BP:      67.0 ml RIGHT VENTRICLE RV S prime:     13.10 cm/s TAPSE (M-mode): 2.1 cm LEFT ATRIUM             Index        RIGHT ATRIUM           Index LA diam:        3.50 cm 1.65 cm/m   RA Area:     16.90 cm LA Vol (A2C):   29.0 ml 13.67 ml/m  RA Volume:   46.40 ml  21.86 ml/m LA Vol (A4C):   51.8 ml 24.41 ml/m LA Biplane Vol: 39.1  ml 18.42 ml/m  AORTIC VALVE AV Area (Vmax):    1.47 cm AV Area (Vmean):   1.53 cm AV Area (VTI):     1.57 cm AV Vmax:           243.33 cm/s AV Vmean:          170.000 cm/s AV VTI:            0.539 m AV Peak Grad:      23.7 mmHg AV Mean Grad:      15.0 mmHg LVOT Vmax:         94.20 cm/s LVOT Vmean:        68.600 cm/s LVOT VTI:          0.223 m LVOT/AV VTI ratio: 0.41  AORTA Ao Root diam: 3.40 cm Ao Asc diam:  3.20 cm MITRAL VALVE MV Area (PHT): 2.11 cm     SHUNTS MV Decel Time: 359 msec     Systemic VTI:  0.22 m MV E velocity: 74.40 cm/s   Systemic Diam: 2.20 cm MV A velocity: 127.00 cm/s MV E/A ratio:  0.59 Rudean Haskell MD Electronically signed by Rudean Haskell MD Signature Date/Time: 04/18/2022/4:19:54 PM    Final    CT ANGIO HEAD NECK W WO CM  Result Date: 04/18/2022 CLINICAL DATA:  Stroke follow-up EXAM: CT ANGIOGRAPHY HEAD AND NECK TECHNIQUE: Multidetector CT imaging of the head and neck was performed using the standard protocol during bolus administration of intravenous contrast. Multiplanar CT image reconstructions and MIPs were obtained to evaluate the vascular anatomy. Carotid stenosis measurements (when applicable) are obtained utilizing NASCET criteria, using the distal internal carotid diameter as the denominator. RADIATION DOSE REDUCTION: This exam was performed according to the departmental dose-optimization program which includes automated exposure control, adjustment of the mA and/or kV according to patient size and/or use of iterative reconstruction technique. CONTRAST:  46m OMNIPAQUE IOHEXOL 350 MG/ML SOLN COMPARISON:  Brain MRI from yesterday FINDINGS: CTA NECK FINDINGS Aortic arch: Atheromatous plaque with 2 vessel arch. No acute finding Right carotid system: Diffuse atheromatous wall thickening of the common carotid with accentuated plaque deposition at the bifurcation and proximal ICA. No ulceration or CCA/ICA flow reducing stenosis. There is high-grade narrowing at the origin of the ECA. Left carotid system: Diffuse atheromatous wall thickening of the common carotid with accentuated mixed density plaque at the bifurcation and proximal ICA. No ulceration or CCA/ICA flow reducing stenosis. There is high-grade narrowing at the origin of the ECA. Vertebral arteries: No proximal subclavian stenosis. Calcified plaque at both vertebral origins with mild narrowing. Otherwise the vertebral arteries are smoothly contoured and widely patent to the dura. Skeleton: Generalized cervical spine degeneration Other neck: No acute finding Upper chest: No acute finding Review of the MIP images confirms  the above findings CTA HEAD FINDINGS Anterior circulation: Atheromatous calcification of the carotid siphons. No branch occlusion, beading, or aneurysm. Posterior circulation: Right dominant vertebral artery. Atheromatous plaque affecting the vertebral arteries. The vertebral and basilar arteries are widely patent. No branch occlusion, beading, or aneurysm Venous sinuses: Diffusely patent Anatomic variants: None significant Review of the MIP images confirms the above findings IMPRESSION: 1. No emergent finding. 2. Diffuse atherosclerosis. High-grade bilateral ECA origin stenosis. 3. No flow limiting stenosis in the intracranial circulation. Electronically Signed   By: JJorje GuildM.D.   On: 04/18/2022 05:10   MR BRAIN WO CONTRAST  Result Date: 04/17/2022 CLINICAL DATA:  Stroke suspected, difficulty speaking resolved EXAM: MRI HEAD WITHOUT CONTRAST  TECHNIQUE: Multiplanar, multiecho pulse sequences of the brain and surrounding structures were obtained without intravenous contrast. COMPARISON:  No prior MRI, correlation is made with CT head 04/17/2022 FINDINGS: Brain: No restricted diffusion to suggest acute or subacute infarct. No acute hemorrhage, mass mass effect, or midline shift. No hydrocephalus or extra-axial collection. Partial empty sella. Minimally low lying cerebellar tonsils, which extend 1-2 mm below the foramen magnum, which does not qualify as cerebellar ectopia or Chiari malformation. Scattered foci of hemosiderin deposition, most prominently in the parietal and occipital lobes, most likely sequela of prior hypertensive microhemorrhages. Scattered and confluent T2 hyperintense signal in the periventricular white matter, likely the sequela of moderate chronic small vessel ischemic disease. Vascular: Normal arterial flow voids. Skull and upper cervical spine: Normal marrow signal. Sinuses/Orbits: No acute finding. Status post bilateral lens replacements. Other: The mastoids are well aerated.  IMPRESSION: No acute intracranial process. No evidence of acute or subacute infarct. Electronically Signed   By: Merilyn Baba M.D.   On: 04/17/2022 21:34   CT HEAD WO CONTRAST  Result Date: 04/17/2022 CLINICAL DATA:  Neuro deficit, acute, stroke suspected. Pt arrives to ED with c/o aphasia. Pt reports that yesterday at 4pm pt experienced a 10 minute episode of not being able to speak. She reports she is back at her baseline. EXAM: CT HEAD WITHOUT CONTRAST TECHNIQUE: Contiguous axial images were obtained from the base of the skull through the vertex without intravenous contrast. RADIATION DOSE REDUCTION: This exam was performed according to the departmental dose-optimization program which includes automated exposure control, adjustment of the mA and/or kV according to patient size and/or use of iterative reconstruction technique. COMPARISON:  CT head 03/02/2022 BRAIN: BRAIN Patchy and confluent areas of decreased attenuation are noted throughout the deep and periventricular white matter of the cerebral hemispheres bilaterally, compatible with chronic microvascular ischemic disease. No evidence of large-territorial acute infarction. No parenchymal hemorrhage. No mass lesion. No extra-axial collection. No mass effect or midline shift. No hydrocephalus. Basilar cisterns are patent. Empty sella. Vascular: No hyperdense vessel. Atherosclerotic calcifications are present within the cavernous internal carotid and vertebral arteries. Skull: No acute fracture or focal lesion. Sinuses/Orbits: Paranasal sinuses and mastoid air cells are clear. Bilateral lens replacement. Otherwise the orbits are unremarkable. Other: None. IMPRESSION: 1.  No acute intracranial abnormality. 2. Empty sella. Findings is often a normal anatomic variant but can be associated with idiopathic intracranial hypertension (pseudotumor cerebri). Electronically Signed   By: Iven Finn M.D.   On: 04/17/2022 18:25    CT head, MRI, CTA,  echo,   Subjective: Patient was seen and examined at bedside.  Overnight events noted.   Patient reports feeling better,  Patient is cleared from neurology to be discharged.   She is enrolled in the CPAP trial.  Discharge Exam: Vitals:   04/20/22 0445 04/20/22 0740  BP: (!) 144/70 (!) 180/78  Pulse: 70 73  Resp: 18 16  Temp: 98.4 F (36.9 C) 98.2 F (36.8 C)  SpO2: 97% 98%   Vitals:   04/20/22 0002 04/20/22 0405 04/20/22 0445 04/20/22 0740  BP: (!) 161/59 140/76 (!) 144/70 (!) 180/78  Pulse: 66 65 70 73  Resp: '20 18 18 16  ' Temp: 97.9 F (36.6 C) 98.4 F (36.9 C) 98.4 F (36.9 C) 98.2 F (36.8 C)  TempSrc: Oral Oral Oral Oral  SpO2: 92% 99% 97% 98%  Weight:      Height:        General: Pt is alert, awake, not in  acute distress Cardiovascular: RRR, S1/S2 +, no rubs, no gallops Respiratory: CTA bilaterally, no wheezing, no rhonchi Abdominal: Soft, NT, ND, bowel sounds + Extremities: no edema, no cyanosis    The results of significant diagnostics from this hospitalization (including imaging, microbiology, ancillary and laboratory) are listed below for reference.     Microbiology: No results found for this or any previous visit (from the past 240 hour(s)).   Labs: BNP (last 3 results) No results for input(s): "BNP" in the last 8760 hours. Basic Metabolic Panel: Recent Labs  Lab 04/17/22 1302 04/20/22 0152  NA 140 138  K 3.9 4.2  CL 103 107  CO2 27 22  GLUCOSE 109* 107*  BUN 23 30*  CREATININE 1.11* 1.03*  CALCIUM 10.2 8.8*   Liver Function Tests: Recent Labs  Lab 04/17/22 1302  AST 28  ALT 25  ALKPHOS 36*  BILITOT 1.1  PROT 7.5  ALBUMIN 4.2   No results for input(s): "LIPASE", "AMYLASE" in the last 168 hours. No results for input(s): "AMMONIA" in the last 168 hours. CBC: Recent Labs  Lab 04/17/22 1302 04/18/22 0419 04/19/22 0225  WBC 10.1 11.0* 9.5  NEUTROABS 6.3  --   --   HGB 14.1 14.2 12.9  HCT 42.2 41.6 38.5  MCV 90.0 90.4 90.2   PLT 214 219 189   Cardiac Enzymes: No results for input(s): "CKTOTAL", "CKMB", "CKMBINDEX", "TROPONINI" in the last 168 hours. BNP: Invalid input(s): "POCBNP" CBG: Recent Labs  Lab 04/19/22 1145 04/19/22 1629 04/19/22 2119 04/19/22 2153 04/20/22 0620  GLUCAP 116* 111* 136* 113* 111*   D-Dimer No results for input(s): "DDIMER" in the last 72 hours. Hgb A1c Recent Labs    04/18/22 0419  HGBA1C 6.4*   Lipid Profile Recent Labs    04/18/22 0419  CHOL 176  HDL 40*  LDLCALC 98  TRIG 191*  CHOLHDL 4.4   Thyroid function studies No results for input(s): "TSH", "T4TOTAL", "T3FREE", "THYROIDAB" in the last 72 hours.  Invalid input(s): "FREET3" Anemia work up No results for input(s): "VITAMINB12", "FOLATE", "FERRITIN", "TIBC", "IRON", "RETICCTPCT" in the last 72 hours. Urinalysis    Component Value Date/Time   COLORURINE COLORLESS (A) 11/27/2021 0629   APPEARANCEUR CLEAR 11/27/2021 0629   LABSPEC 1.004 (L) 11/27/2021 0629   PHURINE 7.0 11/27/2021 0629   GLUCOSEU NEGATIVE 11/27/2021 0629   HGBUR NEGATIVE 11/27/2021 0629   HGBUR negative 07/18/2010 0915   BILIRUBINUR negative 01/07/2022 1214   BILIRUBINUR Negative 07/12/2021 0947   KETONESUR negative 01/07/2022 1214   KETONESUR NEGATIVE 11/27/2021 0629   PROTEINUR =100 (A) 01/07/2022 1214   PROTEINUR NEGATIVE 11/27/2021 0629   UROBILINOGEN 0.2 01/07/2022 1214   UROBILINOGEN 1.0 01/24/2015 0131   NITRITE Positive (A) 01/07/2022 1214   NITRITE NEGATIVE 11/27/2021 0629   LEUKOCYTESUR Large (3+) (A) 01/07/2022 1214   LEUKOCYTESUR NEGATIVE 11/27/2021 0629   Sepsis Labs Recent Labs  Lab 04/17/22 1302 04/18/22 0419 04/19/22 0225  WBC 10.1 11.0* 9.5   Microbiology No results found for this or any previous visit (from the past 240 hour(s)).   Time coordinating discharge: Over 30 minutes  SIGNED:   Shawna Clamp, MD  [facility] 04/20/2022, 2:45 PM Pager   If 7PM-7AM, please contact  night-coverage [email or web address] Password [email or web address]

## 2022-04-20 NOTE — TOC Transition Note (Signed)
Transition of Care [facility]) - CM/SW Discharge Note   Patient Details  Name: [name] MRN: [id number] Date of Birth: May 26, 1941  Transition of Care [facility] CM/SW Contact:  Kermit Balo, RN Phone Number: 04/20/2022, 11:02 AM   Clinical Narrative:    Patient is discharging home with self care. Pt refused DME and outpatient therapy.  Pt has transportation home.   Final next level of care: Home/Self Care Barriers to Discharge: No Barriers Identified   Patient Goals and CMS Choice        Discharge Placement                       Discharge Plan and Services                                     Social Determinants of Health (SDOH) Interventions     Readmission Risk Interventions     No data to display

## 2022-04-20 NOTE — Plan of Care (Signed)
  Problem: Education: Goal: Knowledge of General Education information will improve Description: Including pain rating scale, medication(s)/side effects and non-pharmacologic comfort measures Outcome: Progressing   Problem: Clinical Measurements: Goal: Ability to maintain clinical measurements within normal limits will improve Outcome: Progressing Goal: Will remain free from infection Outcome: Progressing Goal: Respiratory complications will improve Outcome: Progressing Goal: Cardiovascular complication will be avoided Outcome: Progressing   Problem: Activity: Goal: Risk for activity intolerance will decrease Outcome: Progressing   Problem: Nutrition: Goal: Adequate nutrition will be maintained Outcome: Progressing   Problem: Coping: Goal: Level of anxiety will decrease Outcome: Progressing   Problem: Ischemic Stroke/TIA Tissue Perfusion: Goal: Complications of ischemic stroke/TIA will be minimized Outcome: Progressing

## 2022-04-20 NOTE — Plan of Care (Signed)
All test have been completed. Pt ready for discharge. Problem: Education: Goal: Ability to describe self-care measures that may prevent or decrease complications (Diabetes Survival Skills Education) will improve Outcome: Adequate for Discharge Goal: Individualized Educational Video(s) Outcome: Adequate for Discharge   Problem: Coping: Goal: Ability to adjust to condition or change in health will improve Outcome: Adequate for Discharge   Problem: Fluid Volume: Goal: Ability to maintain a balanced intake and output will improve Outcome: Adequate for Discharge   Problem: Health Behavior/Discharge Planning: Goal: Ability to identify and utilize available resources and services will improve Outcome: Adequate for Discharge Goal: Ability to manage health-related needs will improve Outcome: Adequate for Discharge   Problem: Metabolic: Goal: Ability to maintain appropriate glucose levels will improve Outcome: Adequate for Discharge   Problem: Nutritional: Goal: Maintenance of adequate nutrition will improve Outcome: Adequate for Discharge Goal: Progress toward achieving an optimal weight will improve Outcome: Adequate for Discharge   Problem: Skin Integrity: Goal: Risk for impaired skin integrity will decrease Outcome: Adequate for Discharge   Problem: Tissue Perfusion: Goal: Adequacy of tissue perfusion will improve Outcome: Adequate for Discharge   Problem: Education: Goal: Knowledge of General Education information will improve Description: Including pain rating scale, medication(s)/side effects and non-pharmacologic comfort measures Outcome: Adequate for Discharge   Problem: Health Behavior/Discharge Planning: Goal: Ability to manage health-related needs will improve Outcome: Adequate for Discharge   Problem: Clinical Measurements: Goal: Ability to maintain clinical measurements within normal limits will improve Outcome: Adequate for Discharge Goal: Will remain free from  infection Outcome: Adequate for Discharge Goal: Diagnostic test results will improve Outcome: Adequate for Discharge Goal: Respiratory complications will improve Outcome: Adequate for Discharge Goal: Cardiovascular complication will be avoided Outcome: Adequate for Discharge   Problem: Activity: Goal: Risk for activity intolerance will decrease Outcome: Adequate for Discharge   Problem: Nutrition: Goal: Adequate nutrition will be maintained Outcome: Adequate for Discharge   Problem: Coping: Goal: Level of anxiety will decrease Outcome: Adequate for Discharge   Problem: Elimination: Goal: Will not experience complications related to bowel motility Outcome: Adequate for Discharge Goal: Will not experience complications related to urinary retention Outcome: Adequate for Discharge   Problem: Pain Managment: Goal: General experience of comfort will improve Outcome: Adequate for Discharge   Problem: Safety: Goal: Ability to remain free from injury will improve Outcome: Adequate for Discharge   Problem: Skin Integrity: Goal: Risk for impaired skin integrity will decrease Outcome: Adequate for Discharge   Problem: Education: Goal: Knowledge of disease or condition will improve Outcome: Adequate for Discharge Goal: Knowledge of secondary prevention will improve (SELECT ALL) Outcome: Adequate for Discharge   Problem: Ischemic Stroke/TIA Tissue Perfusion: Goal: Complications of ischemic stroke/TIA will be minimized Outcome: Adequate for Discharge   Problem: Acute Rehab PT Goals(only PT should resolve) Goal: Patient Will Transfer Sit To/From Stand Outcome: Adequate for Discharge Goal: Pt Will Transfer Bed To Chair/Chair To Bed Outcome: Adequate for Discharge Goal: Pt Will Perform Standing Balance Or Pre-Gait Outcome: Adequate for Discharge Goal: Pt Will Ambulate Outcome: Adequate for Discharge Goal: Pt Will Go Up/Down Stairs Outcome: Adequate for Discharge   Problem:  SLP Dysphagia Goals Goal: Misc Dysphagia Goal Outcome: Adequate for Discharge

## 2022-04-20 NOTE — Progress Notes (Signed)
Modified Barium Swallow Progress Note  Patient Details  Name: [name] MRN: [id number] Date of Birth: 10/22/1941  Today's Date: 04/20/2022  Modified Barium Swallow completed.  Full report located under Chart Review in the Imaging Section.  Brief recommendations include the following:  Clinical Impression  Pt was seen in radiology suite for modified barium swallow study. Trials of puree solids, regular texture solids, a 2m barium tablet, and thin liquids via cup and straw were administered. Pt's oropharyngeal swallow mechanism was within functional limits. CP bar was noted accross consistencies, but was more prominent with liquids, and no residue was noted at the pharygoesophageal segment. Pt reported globus sensation intermittently, and mild mid-thoracic esophageal stasis was briefly noted, but cleared with time. It is recommended that a regular texture diet with thin liquids be continued at this time with observance of swallowing precautions. SLP will see patient once more to ensure tolerance of the diet and to reinforce observance of precautions; however, further skilled SLP services will likely not be needed beyond that.   Swallow Evaluation Recommendations       SLP Diet Recommendations: Regular solids;Thin liquid   Liquid Administration via: Cup;Straw   Medication Administration: Whole meds with puree (or with liquids)   Supervision: Patient able to self feed   Compensations: Slow rate;Small sips/bites;Follow solids with liquid   Postural Changes: Seated upright at 90 degrees          Sumedh Shinsato I. PHardin Negus MBrownell CLake SuccessOffice number [phone] [phone]  SHorton Marshall6/22/2023,10:35 AM

## 2022-04-28 DIAGNOSIS — N1831 Chronic kidney disease, stage 3a: Secondary | ICD-10-CM

## 2022-04-28 DIAGNOSIS — E1122 Type 2 diabetes mellitus with diabetic chronic kidney disease: Secondary | ICD-10-CM | POA: Diagnosis not present

## 2022-04-28 DIAGNOSIS — I129 Hypertensive chronic kidney disease with stage 1 through stage 4 chronic kidney disease, or unspecified chronic kidney disease: Secondary | ICD-10-CM

## 2022-05-03 ENCOUNTER — Telehealth: Payer: Self-pay

## 2022-05-03 NOTE — Chronic Care Management (AMB) (Signed)
Chronic Care Management Pharmacy Assistant   Name: JUSTEEN HEHR  MRN: [id number] DOB: [birth year]  Reason for Encounter: Medication Review/ Medication coordination  Recent office visits:   04-05-2022 Little, Claudette Stapler, RN (CCM)  Recent consult visits:  None  Hospital visits:  Medication Reconciliation was completed by comparing discharge summary, patient's EMR and Pharmacy list, and upon discussion with patient.  Admitted to the hospital on 04-17-2022 due to Transient speech disturbance. Discharge date was 04-20-2022.  Discharged from [place]?Medications Started at [facility] Discharge:?? Plavix 75 mg daily  Medication Changes at Hospital Discharge: None  Medications Discontinued at Hospital Discharge: Lidoderm Tessalon  Amoxicillin  Medications that remain the same after Hospital Discharge:??  -All other medications will remain the same.    Hospital visits:  Medication Reconciliation was completed by comparing discharge summary, patient's EMR and Pharmacy list, and upon discussion with patient.   Admitted to the hospital on 03-21-2022 due to Tongue irritation. Discharge date was 03-21-2022. Discharged from [place]?Medications Started at [facility] Discharge:?? None   Medication Changes at Hospital Discharge: None   Medications Discontinued at Hospital Discharge: None   Medications that remain the same after Hospital Discharge:??  -All other medications will remain the same.     Hospital visits:  Medication Reconciliation was completed by comparing discharge summary, patient's EMR and Pharmacy list, and upon discussion with patient.   Admitted to the hospital on 03-02-2022 due to Fall. Discharge date was 03-02-2022. Discharged from [facility] emergency department.   New?Medications Started at [facility] Discharge:?? None   Medication Changes at Hospital Discharge: None   Medications  Discontinued at Hospital Discharge: None   Medications that remain the same after Hospital Discharge:??  -All other medications will remain the same.    Medications: Outpatient Encounter Medications as of 05/03/2022  Medication Sig Note   allopurinol (ZYLOPRIM) 300 MG tablet Take 1 tablet by mouth daily    aspirin EC 81 MG tablet Take 1 tablet (81 mg total) by mouth daily.    atenolol-chlorthalidone (TENORETIC) 50-25 MG tablet Take 1 tablet by mouth daily.    bisacodyl (DULCOLAX) 5 MG EC tablet Take 5 mg by mouth daily as needed for moderate constipation.    Blood Pressure KIT 1 Units by Does not apply route daily. Check blood pressure daily for hypertension dx code I10    clopidogrel (PLAVIX) 75 MG tablet Take 1 tablet (75 mg total) by mouth daily.    diclofenac Sodium (VOLTAREN) 1 % GEL Apply 4 g topically 4 (four) times daily. Apply to back    Dulaglutide (TRULICITY) 9.45 WT/8.8EK SOPN Inject 0.5 mg into the skin every 14 (fourteen) days. 04/18/2022: Usually does on Fridays   hydrocortisone cream 1 % Apply to affected area 2 times daily    Lancets (ONETOUCH DELICA PLUS [id number]) MISC USE TO CHECK BLOOD SUGAR TWICE DAILY    Magnesium 250 MG TABS Take 1 tablet (250 mg total) by mouth every evening. Take with dinner meal    Multiple Vitamin (MULTIVITAMIN) tablet Take 1 tablet by mouth daily.    Multiple Vitamins-Minerals (ZINC PO) Take 1 tablet by mouth daily.    ONETOUCH VERIO test strip USE TO TEST 2 TIMES DAILY    rosuvastatin (CRESTOR) 20 MG tablet TAKE 1 TABLET(20 MG) BY MOUTH DAILY (Patient taking differently: Take 20 mg by mouth daily. TAKE 1 TABLET(20 MG) BY MOUTH DAILY)    Saccharomyces boulardii (PROBIOTIC)  250 MG CAPS 1 tablet by mouth daily    solifenacin (VESICARE) 5 MG tablet Take 5 mg by mouth every morning.    tiZANidine (ZANAFLEX) 4 MG tablet Take 0.5 tablets (2 mg total) by mouth every 6 (six) hours as needed for muscle spasms.    valsartan (DIOVAN) 80 MG tablet Take 1  tablet (80 mg total) by mouth daily.    vitamin B-12 (CYANOCOBALAMIN) 100 MCG tablet Take 1 tablet by mouth daily    Vitamin D, Ergocalciferol, (DRISDOL) 1.25 MG (50000 UNIT) CAPS capsule TAKE ONE CAPSULE BY MOUTH ONCE WEEKLY BEFORE BREAKFAST ON  TUESDAYS and TAKE ONE CAPSULE BY MOUTH ONCE WEEKLY BEFORE BREAKFAST ON FRIDAYS (Patient taking differently: See admin instructions. TAKE ONE CAPSULE BY MOUTH ONCE WEEKLY BEFORE BREAKFAST ON  TUESDAYS and TAKE ONE CAPSULE BY MOUTH ONCE WEEKLY BEFORE BREAKFAST ON FRIDAYS) 04/18/2022: Tuesdays and Friday    No facility-administered encounter medications on file as of 05/03/2022.  Reviewed chart for medication changes ahead of medication coordination call.   BP Readings from Last 3 Encounters:  04/20/22 (!) 180/78  03/21/22 (!) 148/84  03/08/22 132/80    Lab Results  Component Value Date   HGBA1C 6.4 (H) 04/18/2022     Patient obtains medications through Adherence Packaging  30 Days   Last adherence delivery included:  Valsartan 80 mg before breakfast Rosuvastatin 20 mg daily before breakfast Atenolol-Chlorthalidone 50/25 mg daily before breakfast Allopurinol 300 mg daily before breakfast Asa 81 mg daily before breakfast Vitamin D2 1.25 mg- 1 tablet twice a week on Tuesdays and Fridays before breakfast  Patient declined (meds) last month: None  Patient is due for next adherence delivery on: 05-15-2022   Called patient and reviewed medications and coordinated delivery.  This delivery to include: Valsartan 80 mg before breakfast Rosuvastatin 20 mg daily before breakfast Atenolol-Chlorthalidone 50/25 mg daily before breakfast Allopurinol 300 mg daily before breakfast Asa 81 mg daily before breakfast Vitamin D2 1.25 mg- 1 tablet twice a week on Tuesdays and Fridays before breakfast  No acute/short fill needed  Patient declined the following medications; None  Patient needs refills for: refill request sent to [facility] Atenolol-Chlorthalidone  Confirmed delivery date of 05-15-2022 advised patient that pharmacy will contact them the morning of delivery.   [place] Pharmacist Assistant [phone]

## 2022-05-16 DIAGNOSIS — E559 Vitamin D deficiency, unspecified: Secondary | ICD-10-CM | POA: Diagnosis not present

## 2022-05-16 DIAGNOSIS — R030 Elevated blood-pressure reading, without diagnosis of hypertension: Secondary | ICD-10-CM | POA: Diagnosis not present

## 2022-05-16 DIAGNOSIS — J302 Other seasonal allergic rhinitis: Secondary | ICD-10-CM | POA: Diagnosis not present

## 2022-05-16 DIAGNOSIS — R5383 Other fatigue: Secondary | ICD-10-CM | POA: Diagnosis not present

## 2022-05-16 DIAGNOSIS — E7800 Pure hypercholesterolemia, unspecified: Secondary | ICD-10-CM | POA: Diagnosis not present

## 2022-05-16 DIAGNOSIS — I10 Essential (primary) hypertension: Secondary | ICD-10-CM | POA: Diagnosis not present

## 2022-05-16 DIAGNOSIS — M109 Gout, unspecified: Secondary | ICD-10-CM | POA: Diagnosis not present

## 2022-05-16 DIAGNOSIS — Z0130 Encounter for examination of blood pressure without abnormal findings: Secondary | ICD-10-CM | POA: Diagnosis not present

## 2022-05-16 DIAGNOSIS — E119 Type 2 diabetes mellitus without complications: Secondary | ICD-10-CM | POA: Diagnosis not present

## 2022-05-29 ENCOUNTER — Ambulatory Visit: Payer: Medicare Other | Admitting: Podiatry

## 2022-05-29 ENCOUNTER — Encounter: Payer: Self-pay | Admitting: Podiatry

## 2022-05-29 DIAGNOSIS — R234 Changes in skin texture: Secondary | ICD-10-CM

## 2022-05-29 DIAGNOSIS — N1831 Chronic kidney disease, stage 3a: Secondary | ICD-10-CM

## 2022-05-29 DIAGNOSIS — B351 Tinea unguium: Secondary | ICD-10-CM

## 2022-05-29 DIAGNOSIS — M79675 Pain in left toe(s): Secondary | ICD-10-CM | POA: Diagnosis not present

## 2022-05-29 DIAGNOSIS — M79674 Pain in right toe(s): Secondary | ICD-10-CM

## 2022-05-29 DIAGNOSIS — L84 Corns and callosities: Secondary | ICD-10-CM

## 2022-05-29 DIAGNOSIS — E1122 Type 2 diabetes mellitus with diabetic chronic kidney disease: Secondary | ICD-10-CM

## 2022-06-02 NOTE — Progress Notes (Signed)
  Subjective:  Patient ID: [name], female    DOB: April 30, 1941,  MRN: [id number]  [name] presents to clinic today for preventative diabetic foot care and painful elongated mycotic toenails 1-5 bilaterally which are tender when wearing enclosed shoe gear. Pain is relieved with periodic professional debridement.  Patient states she scraped her foot a few days ago. She also states her right foot is peeling. She has removed the insole of her right shoe due to swelling of the foot allowing her foot to fit better in the shoe.  Last A1c was 6.3%.  Patient did not check blood glucose today.  PCP is Tilda Franco, FNP , and last visit was  "two weeks ago".  Allergies  Allergen Reactions   Bee Venom Hives   Hydrocodone-Acetaminophen Other (See Comments)    hallucinations hallucinations   Latex Hives and Itching   Oxycodone Other (See Comments)    hallucinations   Vicodin [Hydrocodone-Acetaminophen] Other (See Comments)    hallucinations    Review of Systems: Negative except as noted in the HPI.  Objective:   Vascular Examination: Vascular status intact b/l with palpable DP/PT pulses b/l LE. Pedal hair present b/l. CFT immediate b/l. No edema. No pain with calf compression b/l. Skin temperature gradient WNL b/l. Pedal hair sparse. No pain with calf compression b/l. Lower extremity skin temperature gradient within normal limits. Nonpitting edema noted BLE.  Neurological Examination: Sensation grossly intact b/l with 10 gram monofilament. Vibratory sensation intact b/l. Protective sensation intact 5/5 intact bilaterally with 10g monofilament b/l. Vibratory sensation intact b/l.  Dermatological Examination: Pedal skin with normal turgor, texture and tone b/l. Toenails 1-5 b/l thick, discolored, elongated with subungual debris and pain on dorsal palpation.  Healing transverse scar dorsal forefoot RLE. No edema, no erythema, no drainage, no fluctuance. Hyperkeratotic lesion(s)  R 2nd toe.  No erythema, no edema, no drainage, no fluctuance. She does have peeling of the right foot only which may correspond with sweating induced by removing insole of shoe. No blistering, no odor, no erythema, no fluctuance.  Musculoskeletal Examination: Muscle strength 5/5 to b/l LE.   Radiographs: None    Latest Ref Rng & Units 04/18/2022    4:19 AM 03/01/2022   11:00 AM 11/03/2021   11:07 AM 07/12/2021   10:45 AM  Hemoglobin A1C  Hemoglobin-A1c 4.8 - 5.6 % 6.4  6.9  6.5  6.7    Assessment/Plan: 1. Pain due to onychomycosis of nail   2. Peeling skin   3. Corns   4. Type 2 diabetes mellitus with stage 3a chronic kidney disease, without long-term current use of insulin ([place])      -Patient was evaluated and treated. All patient's and/or POA's questions/concerns answered on today's visit. -Skin peeling may be result of her removing insole from shoe. Monitor. -Patient to continue soft, supportive shoe gear daily. -Toenails 1-5 b/l were debrided in length and girth with sterile nail nippers and dremel without iatrogenic bleeding.  -Corn(s) R 2nd toe pared utilizing sterile scalpel blade without complication or incident. Total number debrided=1. -Patient to apply Neosporin to scar of right foot once daily. -Patient/POA to call should there be question/concern in the interim.   Return in about 3 months (around 08/29/2022).  [name], DPM

## 2022-06-05 DIAGNOSIS — E119 Type 2 diabetes mellitus without complications: Secondary | ICD-10-CM | POA: Diagnosis not present

## 2022-06-05 DIAGNOSIS — E7800 Pure hypercholesterolemia, unspecified: Secondary | ICD-10-CM | POA: Diagnosis not present

## 2022-06-05 DIAGNOSIS — I10 Essential (primary) hypertension: Secondary | ICD-10-CM | POA: Diagnosis not present

## 2022-06-12 DIAGNOSIS — R030 Elevated blood-pressure reading, without diagnosis of hypertension: Secondary | ICD-10-CM | POA: Diagnosis not present

## 2022-06-12 DIAGNOSIS — I10 Essential (primary) hypertension: Secondary | ICD-10-CM | POA: Diagnosis not present

## 2022-06-12 DIAGNOSIS — E7800 Pure hypercholesterolemia, unspecified: Secondary | ICD-10-CM | POA: Diagnosis not present

## 2022-06-12 DIAGNOSIS — E790 Hyperuricemia without signs of inflammatory arthritis and tophaceous disease: Secondary | ICD-10-CM | POA: Diagnosis not present

## 2022-06-12 DIAGNOSIS — Z79899 Other long term (current) drug therapy: Secondary | ICD-10-CM | POA: Diagnosis not present

## 2022-06-12 DIAGNOSIS — T50905A Adverse effect of unspecified drugs, medicaments and biological substances, initial encounter: Secondary | ICD-10-CM | POA: Diagnosis not present

## 2022-06-12 DIAGNOSIS — Z5181 Encounter for therapeutic drug level monitoring: Secondary | ICD-10-CM | POA: Diagnosis not present

## 2022-06-19 DIAGNOSIS — Z5181 Encounter for therapeutic drug level monitoring: Secondary | ICD-10-CM | POA: Diagnosis not present

## 2022-06-19 DIAGNOSIS — R300 Dysuria: Secondary | ICD-10-CM | POA: Diagnosis not present

## 2022-06-19 DIAGNOSIS — J302 Other seasonal allergic rhinitis: Secondary | ICD-10-CM | POA: Diagnosis not present

## 2022-06-19 DIAGNOSIS — I10 Essential (primary) hypertension: Secondary | ICD-10-CM | POA: Diagnosis not present

## 2022-06-19 DIAGNOSIS — R5383 Other fatigue: Secondary | ICD-10-CM | POA: Diagnosis not present

## 2022-06-19 DIAGNOSIS — E7800 Pure hypercholesterolemia, unspecified: Secondary | ICD-10-CM | POA: Diagnosis not present

## 2022-06-19 DIAGNOSIS — E119 Type 2 diabetes mellitus without complications: Secondary | ICD-10-CM | POA: Diagnosis not present

## 2022-06-19 DIAGNOSIS — M109 Gout, unspecified: Secondary | ICD-10-CM | POA: Diagnosis not present

## 2022-06-19 DIAGNOSIS — Z0130 Encounter for examination of blood pressure without abnormal findings: Secondary | ICD-10-CM | POA: Diagnosis not present

## 2022-06-19 DIAGNOSIS — Z79899 Other long term (current) drug therapy: Secondary | ICD-10-CM | POA: Diagnosis not present

## 2022-06-19 DIAGNOSIS — E559 Vitamin D deficiency, unspecified: Secondary | ICD-10-CM | POA: Diagnosis not present

## 2022-06-22 ENCOUNTER — Ambulatory Visit: Payer: Medicare Other | Admitting: Internal Medicine

## 2022-06-22 ENCOUNTER — Ambulatory Visit: Payer: Medicare Other

## 2022-06-26 ENCOUNTER — Telehealth: Payer: Medicare Other

## 2022-06-28 DIAGNOSIS — N1831 Chronic kidney disease, stage 3a: Secondary | ICD-10-CM | POA: Diagnosis not present

## 2022-06-28 DIAGNOSIS — M109 Gout, unspecified: Secondary | ICD-10-CM | POA: Diagnosis not present

## 2022-06-28 DIAGNOSIS — J302 Other seasonal allergic rhinitis: Secondary | ICD-10-CM | POA: Diagnosis not present

## 2022-06-28 DIAGNOSIS — Z0130 Encounter for examination of blood pressure without abnormal findings: Secondary | ICD-10-CM | POA: Diagnosis not present

## 2022-06-28 DIAGNOSIS — R300 Dysuria: Secondary | ICD-10-CM | POA: Diagnosis not present

## 2022-06-28 DIAGNOSIS — E538 Deficiency of other specified B group vitamins: Secondary | ICD-10-CM | POA: Diagnosis not present

## 2022-06-28 DIAGNOSIS — E7800 Pure hypercholesterolemia, unspecified: Secondary | ICD-10-CM | POA: Diagnosis not present

## 2022-06-28 DIAGNOSIS — E119 Type 2 diabetes mellitus without complications: Secondary | ICD-10-CM | POA: Diagnosis not present

## 2022-06-28 DIAGNOSIS — I10 Essential (primary) hypertension: Secondary | ICD-10-CM | POA: Diagnosis not present

## 2022-06-30 DIAGNOSIS — I10 Essential (primary) hypertension: Secondary | ICD-10-CM | POA: Diagnosis not present

## 2022-06-30 DIAGNOSIS — N390 Urinary tract infection, site not specified: Secondary | ICD-10-CM | POA: Diagnosis not present

## 2022-06-30 DIAGNOSIS — A499 Bacterial infection, unspecified: Secondary | ICD-10-CM | POA: Diagnosis not present

## 2022-07-04 DIAGNOSIS — I10 Essential (primary) hypertension: Secondary | ICD-10-CM | POA: Diagnosis not present

## 2022-07-04 DIAGNOSIS — K219 Gastro-esophageal reflux disease without esophagitis: Secondary | ICD-10-CM | POA: Diagnosis not present

## 2022-07-04 DIAGNOSIS — Z0130 Encounter for examination of blood pressure without abnormal findings: Secondary | ICD-10-CM | POA: Diagnosis not present

## 2022-07-04 DIAGNOSIS — N390 Urinary tract infection, site not specified: Secondary | ICD-10-CM | POA: Diagnosis not present

## 2022-07-04 DIAGNOSIS — R300 Dysuria: Secondary | ICD-10-CM | POA: Diagnosis not present

## 2022-07-04 DIAGNOSIS — E119 Type 2 diabetes mellitus without complications: Secondary | ICD-10-CM | POA: Diagnosis not present

## 2022-07-04 DIAGNOSIS — N1831 Chronic kidney disease, stage 3a: Secondary | ICD-10-CM | POA: Diagnosis not present

## 2022-07-04 DIAGNOSIS — E559 Vitamin D deficiency, unspecified: Secondary | ICD-10-CM | POA: Diagnosis not present

## 2022-07-04 DIAGNOSIS — E7800 Pure hypercholesterolemia, unspecified: Secondary | ICD-10-CM | POA: Diagnosis not present

## 2022-07-04 DIAGNOSIS — K1379 Other lesions of oral mucosa: Secondary | ICD-10-CM | POA: Diagnosis not present

## 2022-07-19 ENCOUNTER — Encounter: Payer: Medicare Other | Admitting: Internal Medicine

## 2022-07-29 DIAGNOSIS — B3731 Acute candidiasis of vulva and vagina: Secondary | ICD-10-CM | POA: Diagnosis not present

## 2022-07-29 DIAGNOSIS — E119 Type 2 diabetes mellitus without complications: Secondary | ICD-10-CM | POA: Diagnosis not present

## 2022-07-29 DIAGNOSIS — E7800 Pure hypercholesterolemia, unspecified: Secondary | ICD-10-CM | POA: Diagnosis not present

## 2022-07-29 DIAGNOSIS — Z0130 Encounter for examination of blood pressure without abnormal findings: Secondary | ICD-10-CM | POA: Diagnosis not present

## 2022-07-29 DIAGNOSIS — I10 Essential (primary) hypertension: Secondary | ICD-10-CM | POA: Diagnosis not present

## 2022-07-29 DIAGNOSIS — B372 Candidiasis of skin and nail: Secondary | ICD-10-CM | POA: Diagnosis not present

## 2022-08-20 ENCOUNTER — Emergency Department (HOSPITAL_COMMUNITY)
Admission: EM | Admit: 2022-08-20 | Discharge: 2022-08-20 | Disposition: A | Discharge status: Home / Self Care | Payer: Medicare Other | Attending: Emergency Medicine | Admitting: Emergency Medicine

## 2022-08-20 ENCOUNTER — Encounter (HOSPITAL_COMMUNITY): Payer: Self-pay

## 2022-08-20 DIAGNOSIS — R309 Painful micturition, unspecified: Secondary | ICD-10-CM | POA: Insufficient documentation

## 2022-08-20 DIAGNOSIS — Z91040 Latex allergy status: Secondary | ICD-10-CM | POA: Diagnosis not present

## 2022-08-20 DIAGNOSIS — R1110 Vomiting, unspecified: Secondary | ICD-10-CM | POA: Diagnosis not present

## 2022-08-20 DIAGNOSIS — Z7902 Long term (current) use of antithrombotics/antiplatelets: Secondary | ICD-10-CM | POA: Insufficient documentation

## 2022-08-20 DIAGNOSIS — R102 Pelvic and perineal pain: Secondary | ICD-10-CM

## 2022-08-20 DIAGNOSIS — Z7982 Long term (current) use of aspirin: Secondary | ICD-10-CM | POA: Diagnosis not present

## 2022-08-20 LAB — URINALYSIS, MICROSCOPIC (REFLEX): RBC / HPF: NONE SEEN RBC/hpf (ref 0–5)

## 2022-08-20 LAB — URINALYSIS, ROUTINE W REFLEX MICROSCOPIC
Bilirubin Urine: NEGATIVE
Glucose, UA: NEGATIVE mg/dL
Hgb urine dipstick: NEGATIVE
Ketones, ur: NEGATIVE mg/dL
Nitrite: NEGATIVE
Protein, ur: NEGATIVE mg/dL
Specific Gravity, Urine: 1.01 (ref 1.005–1.030)
pH: 6 (ref 5.0–8.0)

## 2022-08-20 LAB — WET PREP, GENITAL
Clue Cells Wet Prep HPF POC: NONE SEEN
Sperm: NONE SEEN
Trich, Wet Prep: NONE SEEN
WBC, Wet Prep HPF POC: <10 (ref <10)
Yeast Wet Prep HPF POC: NONE SEEN

## 2022-08-20 NOTE — ED Provider Triage Note (Signed)
Emergency Medicine Provider Triage Evaluation Note  [name] , a 81 y.o. female  was evaluated in triage.  Pt complains of urinary tract infection for the last few months, has been on multiple antibiotics, has since resolved, but now has vaginal discharge and burning with urination again. Has been on miconazole suppositories and fluconazole without relief. Reports rash/burnt area in groin. No fever, nausea, vomiting.   Review of Systems  Positive: Dysuria, vaginal discharge Negative: Fever, chills  Physical Exam  BP (!) 165/71 (BP Location: Right Arm)   Pulse 64   Temp 98.2 F (36.8 C) (Oral)   Resp 18   SpO2 100%  Gen:   Awake, no distress   Resp:  Normal effort  MSK:   Moves extremities without difficulty   Medical Decision Making  Medically screening exam initiated at 9:43 AM.  Appropriate orders placed.  JAYLEENE GLAESER was informed that the remainder of the evaluation will be completed by another provider, this initial triage assessment does not replace that evaluation, and the importance of remaining in the ED until their evaluation is complete.     Osvaldo Shipper, [place] 08/20/22 [phone]

## 2022-08-20 NOTE — ED Provider Notes (Signed)
[facility] DEPT Provider Note   CSN: [id number] Arrival date & time: 08/20/22  3557     History  Chief Complaint  Patient presents with   Burning with urination    [name] is a 81 y.o. female.  This 81 year old female presents with 64-monthhistory of vaginal discomfort with associated dysuria.  Has been diagnosed with UTI and Candida in the past.  Denies any lower abdominal discomfort.  No fever or chills.  Emesis noted.  No flank pain.  Concern for possible recurrent UTI.  Patient has history of urethral injury with urethral reconstruction       Home Medications Prior to Admission medications   Medication Sig Start Date End Date Taking? Authorizing Provider  allopurinol (ZYLOPRIM) 100 MG tablet Take 100 mg by mouth daily. 05/16/22   [provider]  allopurinol (ZYLOPRIM) 300 MG tablet Take 1 tablet by mouth daily 02/02/22   SGlendale Chard MD  amLODipine-benazepril (LOTREL) 5-10 MG capsule Take 1 tablet by mouth daily.    [provider]  aspirin EC 81 MG tablet Take 1 tablet (81 mg total) by mouth daily. 02/02/22   SGlendale Chard MD  atenolol-chlorthalidone (TENORETIC) 50-25 MG tablet Take 1 tablet by mouth daily. 11/07/21   SGlendale Chard MD  benazepril (LOTENSIN) 10 MG tablet Take 1 tablet by mouth daily.    [provider]  benzonatate (TESSALON) 100 MG capsule     [provider]  bisacodyl (DULCOLAX) 5 MG EC tablet Take 5 mg by mouth daily as needed for moderate constipation.    [provider]  Blood Pressure KIT 1 Units by Does not apply route daily. Check blood pressure daily for hypertension dx code I10 09/14/21   RSkeet Latch MD  cephALEXin ([facility] 250 MG capsule     [provider]  clopidogrel (PLAVIX) 75 MG tablet Take 1 tablet (75 mg total) by mouth daily. 04/21/22   KShawna Clamp MD  cyanocobalamin 100 MCG tablet Take 1 tablet by mouth every evening.    [provider]  diclofenac Sodium (VOLTAREN) 1 % GEL Apply 4 g topically 4 (four) times daily. Apply to back 11/26/21   Palumbo, April, MD  Dulaglutide (TRULICITY) 03.22MGU/5.4YHSOPN Inject 0.5 mg into the skin every 14 (fourteen) days.    [provider]  FARXIGA 10 MG TABS tablet Take 10 mg by mouth daily. 05/16/22   [provider]  hydrocortisone cream 1 % Apply to affected area 2 times daily 03/01/22 03/01/23  MMinette Brine FNP  hydrOXYzine (ATARAX) 10 MG tablet 10 mg by oral route.    [provider]  Lancets (ONETOUCH DELICA PLUS [id number] MFlasherUSE TO CHECK BLOOD SUGAR TWICE DAILY 12/24/20   SGlendale Chard MD  levocetirizine (XYZAL) 5 MG tablet 5 mg by oral route.    [provider]  levofloxacin (LEVAQUIN) 500 MG tablet Take 1 tablet by mouth daily.    [provider]  Magnesium 250 MG TABS Take 1 tablet (250 mg total) by mouth every evening. Take with dinner meal 08/18/21   SGlendale Chard MD  montelukast (SINGULAIR) 10 MG tablet SMARTSIG:1 Tablet(s) By Mouth Every Evening 05/16/22   [provider]  Multiple Vitamin (MULTIVITAMIN) tablet Take 1 tablet by mouth daily. 08/18/21   SGlendale Chard MD  Multiple Vitamins-Minerals (ZINC PO) Take 1 tablet by mouth daily.    [provider]  nitrofurantoin, macrocrystal-monohydrate, (MACROBID) 100 MG capsule     [provider]  ONETOUCH VERIO test strip USE TO TEST 2 TIMES DAILY 12/20/20   [name], MD  [facility], 1 MG/DOSE, 4 MG/3ML SOPN SMARTSIG:1 Milligram(s) Topical Once a Week 05/18/22   [provider]  pantoprazole (PROTONIX) 40 MG tablet     [provider]  rosuvastatin (CRESTOR) 20 MG tablet TAKE 1 TABLET(20 MG) BY MOUTH DAILY Patient taking differently: Take 20 mg by mouth daily. TAKE 1 TABLET(20 MG) BY MOUTH DAILY 02/02/22   [name], MD  Saccharomyces boulardii (PROBIOTIC) 250 MG CAPS 1 tablet by mouth daily 08/18/21   [name], MD   solifenacin (VESICARE) 5 MG tablet Take 5 mg by mouth every morning. 02/09/22   [provider]  tiZANidine (ZANAFLEX) 4 MG tablet Take 0.5 tablets (2 mg total) by mouth every 6 (six) hours as needed for muscle spasms. 11/27/21   Luna Fuse, MD  valsartan (DIOVAN) 80 MG tablet Take 1 tablet (80 mg total) by mouth daily. 09/14/21   Skeet Latch, MD  vitamin B-12 (CYANOCOBALAMIN) 100 MCG tablet Take 1 tablet by mouth daily 08/18/21   [name], MD  Vitamin D, Ergocalciferol, (DRISDOL) 1.25 MG (50000 UNIT) CAPS capsule TAKE ONE CAPSULE BY MOUTH ONCE WEEKLY BEFORE BREAKFAST ON  TUESDAYS and TAKE ONE CAPSULE BY MOUTH ONCE WEEKLY BEFORE BREAKFAST ON FRIDAYS Patient taking differently: See admin instructions. TAKE ONE CAPSULE BY MOUTH ONCE WEEKLY BEFORE BREAKFAST ON  TUESDAYS and TAKE ONE CAPSULE BY MOUTH ONCE WEEKLY BEFORE BREAKFAST ON FRIDAYS 03/08/22   [name], MD      Allergies    Bee venom, Hydrocodone-acetaminophen, Latex, Oxycodone, and Vicodin [hydrocodone-acetaminophen]    Review of Systems   Review of Systems  All other systems reviewed and are negative.   Physical Exam Updated Vital Signs BP (!) 151/66 (BP Location: Right Wrist)   Pulse 68   Temp 97.9 F (36.6 C) (Oral)   Resp 16   SpO2 100%  Physical Exam Vitals and nursing note reviewed. Exam conducted with a chaperone present.  Constitutional:      General: She is not in acute distress.    Appearance: Normal appearance. She is well-developed. She is not toxic-appearing.  HENT:     Head: Normocephalic and atraumatic.  Eyes:     General: Lids are normal.     Conjunctiva/sclera: Conjunctivae normal.     Pupils: Pupils are equal, round, and reactive to light.  Neck:     Thyroid: No thyroid mass.     Trachea: No tracheal deviation.  Cardiovascular:     Rate and Rhythm: Normal rate and regular rhythm.     Heart sounds: Normal heart sounds. No murmur heard.    No gallop.  Pulmonary:     Effort:  Pulmonary effort is normal. No respiratory distress.     Breath sounds: Normal breath sounds. No stridor. No decreased breath sounds, wheezing, rhonchi or rales.  Abdominal:     General: There is no distension.     Palpations: Abdomen is soft.     Tenderness: There is no abdominal tenderness. There is no rebound.  Genitourinary:    Comments: No External signs of infection appreciated. Musculoskeletal:        General: No tenderness. Normal range of motion.     Cervical back: Normal range of motion and neck supple.  Skin:    General: Skin is warm and dry.     Findings: No abrasion or rash.  Neurological:     Mental Status: She  is alert and oriented to person, place, and time. Mental status is at baseline.     GCS: GCS eye subscore is 4. GCS verbal subscore is 5. GCS motor subscore is 6.     Cranial Nerves: No cranial nerve deficit.     Sensory: No sensory deficit.     Motor: Motor function is intact.  Psychiatric:        Attention and Perception: Attention normal.        Speech: Speech normal.        Behavior: Behavior normal.     ED Results / Procedures / Treatments   Labs (all labs ordered are listed, but only abnormal results are displayed) Labs Reviewed  URINALYSIS, ROUTINE W REFLEX MICROSCOPIC - Abnormal; Notable for the following components:      Result Value   Leukocytes,Ua TRACE (*)    All other components within normal limits  URINALYSIS, MICROSCOPIC (REFLEX) - Abnormal; Notable for the following components:   Bacteria, UA RARE (*)    All other components within normal limits  WET PREP, GENITAL  URINE CULTURE    EKG None  Radiology No results found.  Procedures Procedures    Medications Ordered in ED Medications - No data to display  ED Course/ Medical Decision Making/ A&P                           Medical Decision Making  Patient's wet prep showed no evidence of infection here.  Urinalysis negative.  Will give referral to  urology.        Final Clinical Impression(s) / ED Diagnoses Final diagnoses:  None    Rx / DC Orders ED Discharge Orders     None         Lacretia Leigh, MD 08/20/22 1515

## 2022-08-20 NOTE — ED Triage Notes (Signed)
Pt presents with c/o burning with urination. Pt reports she was diagnosed with a UTI approx one month ago and was given antibiotics for the infection. Pt reports that from those antibiotics, she developed a yeast infection and has been in a great deal of discomfort from that.

## 2022-08-21 LAB — URINE CULTURE

## 2022-08-23 DIAGNOSIS — N3941 Urge incontinence: Secondary | ICD-10-CM | POA: Insufficient documentation

## 2022-08-24 ENCOUNTER — Telehealth: Payer: Self-pay

## 2022-08-24 NOTE — Telephone Encounter (Signed)
     Patient  visit on 10/22  at [place] you been able to follow up with your primary care physician? yes  The patient was or was not able to obtain any needed medicine or equipment. yes  Are there diet recommendations that you are having difficulty following? na  Patient expresses understanding of discharge instructions and education provided has no other needs at this time.   yes   [facility], Care Management  [phone] [address], [place], [place] [address] Phone: [phone] Email: [email or web address]

## 2022-09-05 ENCOUNTER — Ambulatory Visit: Payer: Medicare Other | Admitting: Podiatry

## 2022-09-05 ENCOUNTER — Encounter: Payer: Self-pay | Admitting: Podiatry

## 2022-09-05 DIAGNOSIS — L84 Corns and callosities: Secondary | ICD-10-CM | POA: Diagnosis not present

## 2022-09-05 DIAGNOSIS — E1122 Type 2 diabetes mellitus with diabetic chronic kidney disease: Secondary | ICD-10-CM | POA: Diagnosis not present

## 2022-09-05 DIAGNOSIS — M79609 Pain in unspecified limb: Secondary | ICD-10-CM | POA: Diagnosis not present

## 2022-09-05 DIAGNOSIS — N1831 Chronic kidney disease, stage 3a: Secondary | ICD-10-CM | POA: Diagnosis not present

## 2022-09-05 DIAGNOSIS — B351 Tinea unguium: Secondary | ICD-10-CM

## 2022-09-10 NOTE — Progress Notes (Signed)
  Subjective:  Patient ID: [name], female    DOB: [birth year],  MRN: [id number]  [name] presents to clinic today for preventative diabetic foot care and corn(s) right lower extremity and painful thick toenails that are difficult to trim. Painful toenails interfere with ambulation. Aggravating factors include wearing enclosed shoe gear. Pain is relieved with periodic professional debridement. Painful corns are aggravated when weightbearing when wearing enclosed shoe gear. Pain is relieved with periodic professional debridement.  Chief Complaint  Patient presents with   Nail Problem    RFC Bilateral nail trim. 1-5   New problem(s): None.   PCP is Tilda Franco, FNP , and last visit was May 16, 2022.  Allergies  Allergen Reactions   Bee Venom Hives   Hydrocodone-Acetaminophen Other (See Comments)    hallucinations hallucinations   Latex Hives and Itching   Oxycodone Other (See Comments)    hallucinations   Vicodin [Hydrocodone-Acetaminophen] Other (See Comments)    hallucinations    Review of Systems: Negative except as noted in the HPI.  Objective: No changes noted in today's physical examination.  [name] is a pleasant 81 y.o. female in NAD. AAO x 3.  Vascular Examination: Vascular status intact b/l with palpable DP/PT pulses b/l LE. Pedal hair present b/l. CFT immediate b/l. No edema. No pain with calf compression b/l. Skin temperature gradient WNL b/l. Pedal hair sparse. No pain with calf compression b/l. Lower extremity skin temperature gradient within normal limits. Nonpitting edema noted BLE.  Neurological Examination: Sensation grossly intact b/l with 10 gram monofilament. Vibratory sensation intact b/l. Protective sensation intact 5/5 intact bilaterally with 10g monofilament b/l. Vibratory sensation intact b/l.  Dermatological Examination: Pedal skin with normal turgor, texture and tone b/l. Toenails 1-5 b/l thick, discolored, elongated  with subungual debris and pain on dorsal palpation.   Hyperkeratotic lesion(s) R 2nd toe.  No erythema, no edema, no drainage, no fluctuance. She does have peeling of the right foot only which may correspond with sweating induced by removing insole of shoe. No blistering, no odor, no erythema, no fluctuance.  Musculoskeletal Examination: Muscle strength 5/5 to b/l LE.   Radiographs: None  Assessment/Plan: 1. Pain due to onychomycosis of nail   2. Corns   3. Type 2 diabetes mellitus with stage 3a chronic kidney disease, without long-term current use of insulin ([place])     No orders of the defined types were placed in this encounter.   -Consent given for treatment as described below: -Examined patient. -Continue supportive shoe gear daily. -Mycotic toenails 1-5 bilaterally were debrided in length and girth with sterile nail nippers and dremel without incident. -Corn(s) R 2nd toe pared utilizing sterile scalpel blade without complication or incident. Total number debrided=1. -Patient/POA to call should there be question/concern in the interim.   Return in about 3 months (around 12/06/2022).  [name], DPM

## 2022-10-14 ENCOUNTER — Encounter (HOSPITAL_BASED_OUTPATIENT_CLINIC_OR_DEPARTMENT_OTHER): Payer: Self-pay | Admitting: Emergency Medicine

## 2022-10-14 ENCOUNTER — Other Ambulatory Visit: Payer: Self-pay

## 2022-10-14 ENCOUNTER — Emergency Department (HOSPITAL_BASED_OUTPATIENT_CLINIC_OR_DEPARTMENT_OTHER): Payer: Medicare Other | Admitting: Radiology

## 2022-10-14 ENCOUNTER — Emergency Department (HOSPITAL_BASED_OUTPATIENT_CLINIC_OR_DEPARTMENT_OTHER)
Admission: EM | Admit: 2022-10-14 | Discharge: 2022-10-14 | Disposition: A | Discharge status: Home / Self Care | Payer: Medicare Other | Attending: Emergency Medicine | Admitting: Emergency Medicine

## 2022-10-14 DIAGNOSIS — Z79899 Other long term (current) drug therapy: Secondary | ICD-10-CM | POA: Diagnosis not present

## 2022-10-14 DIAGNOSIS — R42 Dizziness and giddiness: Secondary | ICD-10-CM

## 2022-10-14 DIAGNOSIS — Z7982 Long term (current) use of aspirin: Secondary | ICD-10-CM | POA: Diagnosis not present

## 2022-10-14 DIAGNOSIS — Z91040 Latex allergy status: Secondary | ICD-10-CM | POA: Insufficient documentation

## 2022-10-14 DIAGNOSIS — R0789 Other chest pain: Secondary | ICD-10-CM | POA: Insufficient documentation

## 2022-10-14 DIAGNOSIS — R079 Chest pain, unspecified: Secondary | ICD-10-CM

## 2022-10-14 DIAGNOSIS — I10 Essential (primary) hypertension: Secondary | ICD-10-CM | POA: Insufficient documentation

## 2022-10-14 DIAGNOSIS — E119 Type 2 diabetes mellitus without complications: Secondary | ICD-10-CM | POA: Insufficient documentation

## 2022-10-14 DIAGNOSIS — R030 Elevated blood-pressure reading, without diagnosis of hypertension: Secondary | ICD-10-CM | POA: Diagnosis present

## 2022-10-14 LAB — CBC
HCT: 42 % (ref 36.0–46.0)
Hemoglobin: 14 g/dL (ref 12.0–15.0)
MCH: 30 pg (ref 26.0–34.0)
MCHC: 33.3 g/dL (ref 30.0–36.0)
MCV: 90.1 fL (ref 80.0–100.0)
Platelets: 198 10*3/uL (ref 150–400)
RBC: 4.66 MIL/uL (ref 3.87–5.11)
RDW: 13.4 % (ref 11.5–15.5)
WBC: 10.2 10*3/uL (ref 4.0–10.5)
nRBC: 0 % (ref 0.0–0.2)

## 2022-10-14 LAB — BASIC METABOLIC PANEL
Anion gap: 9 (ref 5–15)
BUN: 16 mg/dL (ref 8–23)
CO2: 24 mmol/L (ref 22–32)
Calcium: 9.4 mg/dL (ref 8.9–10.3)
Chloride: 103 mmol/L (ref 98–111)
Creatinine, Ser: 0.9 mg/dL (ref 0.44–1.00)
GFR, Estimated: >60 mL/min (ref >60)
Glucose, Bld: 115 mg/dL — ABNORMAL HIGH (ref 70–99)
Potassium: 4.1 mmol/L (ref 3.5–5.1)
Sodium: 136 mmol/L (ref 135–145)

## 2022-10-14 LAB — TROPONIN I (HIGH SENSITIVITY)
Troponin I (High Sensitivity): 7 ng/L (ref <18)
Troponin I (High Sensitivity): 7 ng/L (ref <18)

## 2022-10-14 LAB — URINALYSIS, ROUTINE W REFLEX MICROSCOPIC
Bilirubin Urine: NEGATIVE
Glucose, UA: NEGATIVE mg/dL
Hgb urine dipstick: NEGATIVE
Ketones, ur: NEGATIVE mg/dL
Leukocytes,Ua: NEGATIVE
Nitrite: NEGATIVE
Protein, ur: NEGATIVE mg/dL
Specific Gravity, Urine: 1.005 (ref 1.005–1.030)
pH: 6 (ref 5.0–8.0)

## 2022-10-14 MED ORDER — AMLODIPINE BESYLATE 5 MG PO TABS
5.0000 mg | ORAL_TABLET | Freq: Once | ORAL | Status: AC
  Administered 2022-10-14: 5 mg via ORAL
  Filled 2022-10-14: qty 1

## 2022-10-14 MED ORDER — AMLODIPINE BESYLATE 5 MG PO TABS: 5.0000 mg | ORAL_TABLET | Freq: Every day | ORAL | 0 refills | Status: DC

## 2022-10-14 NOTE — ED Triage Notes (Signed)
Pt c/o hypertension and chest tightness x 1 hour ago. States that BP was 194/75 at home. States that she is compliant with home medications.

## 2022-10-14 NOTE — ED Provider Notes (Signed)
[facility]ERGENC[facility] DEPT Provider Note   CSN: [id number] Arrival date & time: 10/14/22  0232     History  Chief Complaint  Patient presents with   Hypertension    [name] is a 81 y.o. female.  HPI     This is an 81 year old female who presents with concern for high blood pressure.  Patient reports that she generally did not feel well yesterday after eating breakfast at 11 AM.  Around 5 PM she noted some dizziness and lightheadedness.  She took her blood pressure at home and it was 194/75.  This morning approximately 1 hour prior to arrival she developed some chest tightness.  She states that she takes her blood pressure medications normally in the morning and took her doses correctly yesterday.  Has not taken her doses this morning.  She no longer has any chest tightness or shortness of breath.  No recent fevers.  No recent changes in medications.  Home Medications Prior to Admission medications   Medication Sig Start Date End Date Taking? Authorizing Provider  allopurinol (ZYLOPRIM) 100 MG tablet Take 100 mg by mouth daily. 05/16/22   [provider]  allopurinol (ZYLOPRIM) 300 MG tablet Take 1 tablet by mouth daily 02/02/22   [name], MD  amLODipine-benazepril (LOTREL) 5-10 MG capsule Take 1 tablet by mouth daily.    [provider]  aspirin EC 81 MG tablet Take 1 tablet (81 mg total) by mouth daily. 02/02/22   [name], MD  atenolol-chlorthalidone (TENORETIC) 50-25 MG tablet Take 1 tablet by mouth daily. 11/07/21   [name], MD  benazepril (LOTENSIN) 10 MG tablet Take 1 tablet by mouth daily.    [provider]  benzonatate (TESSALON) 100 MG capsule     [provider]  bisacodyl (DULCOLAX) 5 MG EC tablet Take 5 mg by mouth daily as needed for moderate constipation.    [provider]  Blood Pressure KIT 1 Units by Does not apply route daily. Check blood pressure daily for hypertension dx code I10  09/14/21   Skeet Latch, MD  cephALEXin [facility]) 250 MG capsule     [provider]  clopidogrel (PLAVIX) 75 MG tablet Take 1 tablet (75 mg total) by mouth daily. 04/21/22   Shawna Clamp, MD  cyanocobalamin 100 MCG tablet Take 1 tablet by mouth every evening.    [provider]  diclofenac Sodium (VOLTAREN) 1 % GEL Apply 4 g topically 4 (four) times daily. Apply to back 11/26/21   Palumbo, April, MD  Dulaglutide (TRULICITY) 0.25 [place]/7.0WC SOPN Inject 0.5 mg into the skin every 14 (fourteen) days.    [provider]  FARXIGA 10 MG TABS tablet Take 10 mg by mouth daily. 05/16/22   [provider]  hydrocortisone cream 1 % Apply to affected area 2 times daily 03/01/22 03/01/23  [name], FNP  hydrOXYzine (ATARAX) 10 MG tablet 10 mg by oral route.    [provider]  Lancets (ONETOUCH DELICA PLUS [id number]) [place] USE TO CHECK BLOOD SUGAR TWICE DAILY 12/24/20   [name], MD  levocetirizine (XYZAL) 5 MG tablet 5 mg by oral route.    [provider]  levofloxacin (LEVAQUIN) 500 MG tablet Take 1 tablet by mouth daily.    [provider]  Magnesium 250 MG TABS Take 1 tablet (250 mg total) by mouth every evening. Take with dinner meal 08/18/21   [name], MD  montelukast (SINGULAIR) 10 MG tablet SMARTSIG:1 Tablet(s) By Mouth  Every Evening 05/16/22   [provider]  Multiple Vitamin (MULTIVITAMIN) tablet Take 1 tablet by mouth daily. 08/18/21   [name], MD  Multiple Vitamins-Minerals (ZINC PO) Take 1 tablet by mouth daily.    [provider]  nitrofurantoin, macrocrystal-monohydrate, (MACROBID) 100 MG capsule     [provider]  ONETOUCH VERIO test strip USE TO TEST 2 TIMES DAILY 12/20/20   [name], MD  [facility], 1 MG/DOSE, 4 MG/3ML SOPN SMARTSIG:1 Milligram(s) Topical Once a Week 05/18/22   [provider]  pantoprazole (PROTONIX) 40 MG tablet     [provider]   rosuvastatin (CRESTOR) 20 MG tablet TAKE 1 TABLET(20 MG) BY MOUTH DAILY Patient taking differently: Take 20 mg by mouth daily. TAKE 1 TABLET(20 MG) BY MOUTH DAILY 02/02/22   [name], MD  Saccharomyces boulardii (PROBIOTIC) 250 MG CAPS 1 tablet by mouth daily 08/18/21   [name], MD  solifenacin (VESICARE) 5 MG tablet Take 5 mg by mouth every morning. 02/09/22   [provider]  tiZANidine (ZANAFLEX) 4 MG tablet Take 0.5 tablets (2 mg total) by mouth every 6 (six) hours as needed for muscle spasms. 11/27/21   Luna Fuse, MD  valsartan (DIOVAN) 80 MG tablet Take 1 tablet (80 mg total) by mouth daily. 09/14/21   Skeet Latch, MD  vitamin B-12 (CYANOCOBALAMIN) 100 MCG tablet Take 1 tablet by mouth daily 08/18/21   [name], MD  Vitamin D, Ergocalciferol, (DRISDOL) 1.25 MG (50000 UNIT) CAPS capsule TAKE ONE CAPSULE BY MOUTH ONCE WEEKLY BEFORE BREAKFAST ON  TUESDAYS and TAKE ONE CAPSULE BY MOUTH ONCE WEEKLY BEFORE BREAKFAST ON FRIDAYS Patient taking differently: See admin instructions. TAKE ONE CAPSULE BY MOUTH ONCE WEEKLY BEFORE BREAKFAST ON  TUESDAYS and TAKE ONE CAPSULE BY MOUTH ONCE WEEKLY BEFORE BREAKFAST ON FRIDAYS 03/08/22   [name], MD      Allergies    Bee venom, Hydrocodone-acetaminophen, Latex, Oxycodone, and Vicodin [hydrocodone-acetaminophen]    Review of Systems   Review of Systems  Cardiovascular:  Positive for chest pain.  Neurological:  Positive for light-headedness.  All other systems reviewed and are negative.   Physical Exam Updated Vital Signs BP (!) 167/73   Pulse (!) 57   Temp 98.4 F (36.9 C)   Resp 18   Ht 1.676 m (_0 )   Wt 105.7 kg   SpO2 97%   BMI 37.61 kg/m  Physical Exam Vitals and nursing note reviewed.  Constitutional:      Appearance: She is well-developed.     Comments: Elderly, obese, no acute distress  HENT:     Head: Normocephalic and atraumatic.  Eyes:     Pupils: Pupils are equal, round, and reactive  to light.  Cardiovascular:     Rate and Rhythm: Normal rate and regular rhythm.     Heart sounds: Normal heart sounds.  Pulmonary:     Effort: Pulmonary effort is normal. No respiratory distress.     Breath sounds: No wheezing.  Abdominal:     General: Bowel sounds are normal.     Palpations: Abdomen is soft.     Tenderness: There is no abdominal tenderness.  Musculoskeletal:     Cervical back: Neck supple.  Skin:    General: Skin is warm and dry.  Neurological:     Mental Status: She is alert and oriented to person, place, and time.  Psychiatric:        Mood and Affect: Mood normal.  ED Results / Procedures / Treatments   Labs (all labs ordered are listed, but only abnormal results are displayed) Labs Reviewed  BASIC METABOLIC PANEL - Abnormal; Notable for the following components:      Result Value   Glucose, Bld 115 (*)    All other components within normal limits  URINALYSIS, ROUTINE W REFLEX MICROSCOPIC - Abnormal; Notable for the following components:   Color, Urine COLORLESS (*)    All other components within normal limits  CBC  TROPONIN I (HIGH SENSITIVITY)  TROPONIN I (HIGH SENSITIVITY)    EKG EKG Interpretation  Date/Time:  Saturday October 14 2022 03:31:39 EST Ventricular Rate:  59 PR Interval:  150 QRS Duration: 148 QT Interval:  446 QTC Calculation: 441 R Axis:   64 Text Interpretation: Sinus bradycardia Left bundle branch block Abnormal ECG When compared with ECG of 17-Apr-2022 12:53, PREVIOUS ECG IS PRESENT No significant change since last tracing Confirmed by Thayer Jew [phone]) on 10/14/2022 3:53:26 AM  Radiology DG Chest 2 View  Result Date: 10/14/2022 CLINICAL DATA:  81 year old female with chest pain, hypertension and chest tightness. One hundred ninety-four systolic. EXAM: CHEST - 2 VIEW COMPARISON:  Chest CTA 04/08/2005. Chest radiographs 03/02/2022 and earlier. FINDINGS: PA and lateral views at 0430 hours. Moderate chronic  eventration or elevation of the right hemidiaphragm has not significantly changed since 2006. overall normal lung volumes. Normal cardiac size and mediastinal contours. Visualized tracheal air column is within normal limits. Lung markings appear stable and clear. No pneumothorax or pleural effusion. No acute osseous abnormality identified. Extensive cholelithiasis is evident in the upper abdomen. Negative visible bowel gas. Abdominal Calcified aortic atherosclerosis. IMPRESSION: 1. No acute cardiopulmonary abnormality. 2. Extensive chronic cholelithiasis. 3.  Aortic Atherosclerosis (ICD10-I70.0). Electronically Signed   By: Genevie Ann M.D.   On: 10/14/2022 05:42    Procedures Procedures    Medications Ordered in ED Medications  amLODipine (NORVASC) tablet 5 mg (5 mg Oral Given 10/14/22 5053)    ED Course/ Medical Decision Making/ A&P                           Medical Decision Making Amount and/or Complexity of Data Reviewed Labs: ordered. Radiology: ordered.  Risk Prescription drug management.   This patient presents to the ED for concern of chest pain, hypertension, this involves an extensive number of treatment options, and is a complaint that carries with it a high risk of complications and morbidity.  I considered the following differential and admission for this acute, potentially life threatening condition.  The differential diagnosis includes ACS, PE, pneumothorax, pneumonia, hypertensive urgency or emergency  MDM:    This is an 81 year old female who presents with chest pain and hypertension.  Reports lightheadedness yesterday.  She has a known history of high blood pressure.  She reports compliance with medications.  She is overall nontoxic and vital signs initially notable for blood pressure 135/79.  Upon my evaluation, this is down trended to 167/73.  She states she feels better.  EKG nonischemic.  Initial troponin is negative although this was approximately 1 hour after onset of  chest pain.  She will need repeat.  Chest x-ray without pneumothorax or pneumonia.  Patient to be signed out to oncoming provider for recheck.  (Labs, imaging, consults)  Labs: I Ordered, and personally interpreted labs.  The pertinent results include: CBC, BMP, troponin  Imaging Studies ordered: I ordered imaging studies including chest x-ray I independently visualized  and interpreted imaging. I agree with the radiologist interpretation  Additional history obtained from chart review.  External records from outside source obtained and reviewed including prior evaluations  Cardiac Monitoring: The patient was maintained on a cardiac monitor.  I personally viewed and interpreted the cardiac monitored which showed an underlying rhythm of: sinus  Reevaluation: After the interventions noted above, I reevaluated the patient and found that they have :stayed the same  Social Determinants of Health:  lives independently  Disposition: Discharge  Co morbidities that complicate the patient evaluation  Past Medical History:  Diagnosis Date   Abdominal pain    Arthritis    cervical disc degeneration/ oa left knee, carpal tunnel rt wrist, adhesive capsulitis right shoulder, rt hand weakness; lumbar degeneration   Carpal tunnel syndrome    Complication of anesthesia 2006-at [facility]   breathing problems-no BP med given prior to surgery;  hx of being very sleepy after colon surgery --  states no problems with 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 coli infection    2020   Frequent UTI    hx of urethral injury during colon surgery - states frequent uti's since   GERD (gastroesophageal reflux disease)    H/O hiatal hernia    History of palpitations    in the past   History of shingles    has a lingering itching on back where shingles were    Hyperlipidemia    Hypertension    Numbness and tingling in right hand    pt. states has numbness of right hand very frequently-watch positioning   Obesity    Osteoarthritis    Osteoporosis    Pain    pain left knee and pain right hip and right groin   Personal history of colonic polyps-adenoma 08/26/2008   Pneumonia 2005   Shortness of breath 09/14/2021   Vitamin D deficiency      Medicines Meds ordered this encounter  Medications   amLODipine (NORVASC) tablet 5 mg    I have reviewed the patients home medicines and have made adjustments as needed  Problem List / ED Course: Problem List Items Addressed This Visit   None               Final Clinical Impression(s) / ED Diagnoses Final diagnoses:  None    Rx / DC Orders ED Discharge Orders     None         Merryl Hacker, MD 10/14/22 0710

## 2022-10-14 NOTE — ED Provider Notes (Signed)
Patient arrived hypertensive with no symptoms over the past couple of days of general malaise and not feeling well.  Patient was hypertensive at home.  Follow-up on repeat troponin and reassess. Physical Exam  BP (!) 167/73   Pulse (!) 57   Temp 98.4 F (36.9 C)   Resp 18   Ht '5\' 6"'$  (1.676 m)   Wt 105.7 kg   SpO2 97%   BMI 37.61 kg/m   Physical Exam  Procedures  Procedures  ED Course / MDM     Repeat troponin normal at 7.  Patient has had a 5 mg dose of amlodipine.  Blood pressures have trended down to 160s over 70s.  Patient is reassessed.  She is alert and feels well.  Mental status is clear.  She shows no signs of distress.  We had extensive discussion guarding history of present illness and blood pressure medications.  Patient reports that she did eat at pizza with pepperoni and had a sausage croissant for breakfast yesterday.  She wonders if this might have affected her blood pressure causing it to elevate unexpectedly.  Patient reports she is currently taking valsartan 80 mg and half a tablet of atenolol chlorthalidone 50-25.  She reports her amlodipine had been discontinued and she does not think she is taking that at home anymore.  However she does note that she has a lot of medications prescribed and it is possible that does not always remember clearly which when she is currently taking.  Reports her blood pressure medication are managed both by her nephrologist and cardiologist.  Patient is alert and has excellent mental status and recall.  Is able to discuss her medications with good understanding.  We discussed the plan of continuing the valsartan 80 mg as listed in the [facility] and continuing her atenolol chlorthalidone half a tablet as she reports home use.  We will add 5 mg of amlodipine and patient will carefully monitor her blood pressures at home and document her medications she is taking.  She will then go and review what she is actually taking and her blood pressures  with her cardiologist or nephrologist.  He is discharged in excellent condition alert and blood pressures normalizing.       Charlesetta Shanks, MD 10/14/22 [phone]

## 2022-10-14 NOTE — Discharge Instructions (Addendum)
1.  We reviewed your medications in the emergency department:   You report at home you are currently taking atenolol chlorthalidone open (Tenoretic) 50 mg - 25 mg half a tablet daily.  You will continue taking this.  You report taking valsartan (Diovan) 80 mg daily.  Continue taking this.  You report you are NOT currently taking amlodipine-BenzePrO (Lotrel) 5-10 mg tablet.  You are being prescribed amlodipine 5 mg WITHOUT the benazepril to start taking once a day and monitor your blood pressures. DO NOT use any old tablets of your combination medication AKA Lotrel.   I want you to carefully write down which medications you take based on the label, check your blood pressures 2-3 times a day and write them down daily.  Follow-up with your nephrologist or cardiologist to review your blood pressure medications, how you are taking them and if you need any further adjustments.  When you and your physician have clarified which medications you are actively taking and your blood pressure is at goal, be sure all old and none active medications are discarded to avoid confusion.  Return to the emergency department immediately if you are getting chest pain, shortness of breath, unusually weak dizzy or other concerning symptoms.

## 2022-10-14 NOTE — ED Notes (Signed)
Dc instructions reviewed with patient. Patient voiced understanding. Dc with belongings. Reviewed medication changes extensivelywith patient and spouse, able to teach back.

## 2022-10-16 ENCOUNTER — Encounter (HOSPITAL_COMMUNITY): Payer: Self-pay

## 2022-10-16 ENCOUNTER — Emergency Department (HOSPITAL_COMMUNITY)
Admission: EM | Admit: 2022-10-16 | Discharge: 2022-10-16 | Discharge status: Against Medical Advice | Payer: Medicare Other | Attending: Emergency Medicine | Admitting: Emergency Medicine

## 2022-10-16 ENCOUNTER — Other Ambulatory Visit: Payer: Self-pay

## 2022-10-16 ENCOUNTER — Emergency Department (HOSPITAL_COMMUNITY): Payer: Medicare Other

## 2022-10-16 DIAGNOSIS — R519 Headache, unspecified: Secondary | ICD-10-CM | POA: Insufficient documentation

## 2022-10-16 DIAGNOSIS — R079 Chest pain, unspecified: Secondary | ICD-10-CM | POA: Diagnosis not present

## 2022-10-16 DIAGNOSIS — Z5321 Procedure and treatment not carried out due to patient leaving prior to being seen by health care provider: Secondary | ICD-10-CM | POA: Insufficient documentation

## 2022-10-16 LAB — BASIC METABOLIC PANEL
Anion gap: 10 (ref 5–15)
BUN: 20 mg/dL (ref 8–23)
CO2: 26 mmol/L (ref 22–32)
Calcium: 9.3 mg/dL (ref 8.9–10.3)
Chloride: 104 mmol/L (ref 98–111)
Creatinine, Ser: 1.01 mg/dL — ABNORMAL HIGH (ref 0.44–1.00)
GFR, Estimated: 56 mL/min — ABNORMAL LOW (ref >60)
Glucose, Bld: 150 mg/dL — ABNORMAL HIGH (ref 70–99)
Potassium: 3.9 mmol/L (ref 3.5–5.1)
Sodium: 140 mmol/L (ref 135–145)

## 2022-10-16 LAB — CBC
HCT: 43.1 % (ref 36.0–46.0)
Hemoglobin: 14.2 g/dL (ref 12.0–15.0)
MCH: 30.3 pg (ref 26.0–34.0)
MCHC: 32.9 g/dL (ref 30.0–36.0)
MCV: 91.9 fL (ref 80.0–100.0)
Platelets: 204 10*3/uL (ref 150–400)
RBC: 4.69 MIL/uL (ref 3.87–5.11)
RDW: 13.4 % (ref 11.5–15.5)
WBC: 8.8 10*3/uL (ref 4.0–10.5)
nRBC: 0 % (ref 0.0–0.2)

## 2022-10-16 LAB — TROPONIN I (HIGH SENSITIVITY): Troponin I (High Sensitivity): 6 ng/L (ref <18)

## 2022-10-16 NOTE — ED Provider Triage Note (Signed)
Emergency Medicine Provider Triage Evaluation Note  [name] , a 81 y.o. female  was evaluated in triage.  Pt complains of chest comfort and head pain.  Patient states that her chest feels abnormal.  She is having some tightness.  She yesterday her blood pressure was high despite taking her medications that include amlodipine, losartan, and atenolol. She is also now having severe sharp intermittent pain emanating from the right suboccipital region around her scalp in a rams horn pattern.  It is occurring every 10 seconds.  She describes it as electrical, severely painful..  Review of Systems  Positive: Sharp head pain, chest pain Negative: Fever  Physical Exam  BP (!) 155/96 (BP Location: Left Arm)   Pulse (!) 57   Temp 98.1 F (36.7 C) (Oral)   Resp 18   Ht '5\' 6"'$  (1.676 m)   Wt 105.7 kg   SpO2 98%   BMI 37.61 kg/m  Gen:   Awake, no distress   Resp:  Normal effort  MSK:   Moves extremities without difficulty  Other:  Patient is actively wincing in pain.  Worse when she turns her neck toward the right.  No rash just above zoster  Medical Decision Making  Medically screening exam initiated at 1:24 PM.  Appropriate orders placed.  BRENAE LASECKI was informed that the remainder of the evaluation will be completed by another provider, this initial triage assessment does not replace that evaluation, and the importance of remaining in the ED until their evaluation is complete.  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  Patient would likely benefit from occipital nerve block.  Workup initiated   Margarita Mail, PA-C 10/16/22 1326

## 2022-10-16 NOTE — ED Triage Notes (Signed)
Patient reports that she began having a sharp pain on the right side of her head after waking this AM. Pain increases in her head when she turns her head to the right.  Patient states she has been having vision issues and states "It is off', states it is not new.  No weakness or extremities.

## 2022-10-18 ENCOUNTER — Encounter (HOSPITAL_BASED_OUTPATIENT_CLINIC_OR_DEPARTMENT_OTHER): Payer: Self-pay

## 2022-10-18 ENCOUNTER — Emergency Department (HOSPITAL_COMMUNITY): Payer: Medicare Other

## 2022-10-18 ENCOUNTER — Other Ambulatory Visit: Payer: Self-pay

## 2022-10-18 ENCOUNTER — Emergency Department (HOSPITAL_BASED_OUTPATIENT_CLINIC_OR_DEPARTMENT_OTHER): Payer: Medicare Other

## 2022-10-18 ENCOUNTER — Observation Stay (HOSPITAL_BASED_OUTPATIENT_CLINIC_OR_DEPARTMENT_OTHER)
Admission: EM | Admit: 2022-10-18 | Discharge: 2022-10-20 | Disposition: A | Discharge status: Home / Self Care | Payer: Medicare Other | Attending: Internal Medicine | Admitting: Internal Medicine

## 2022-10-18 DIAGNOSIS — N1831 Chronic kidney disease, stage 3a: Secondary | ICD-10-CM | POA: Diagnosis present

## 2022-10-18 DIAGNOSIS — E876 Hypokalemia: Secondary | ICD-10-CM | POA: Diagnosis not present

## 2022-10-18 DIAGNOSIS — Z7902 Long term (current) use of antithrombotics/antiplatelets: Secondary | ICD-10-CM | POA: Diagnosis not present

## 2022-10-18 DIAGNOSIS — Z7982 Long term (current) use of aspirin: Secondary | ICD-10-CM | POA: Diagnosis not present

## 2022-10-18 DIAGNOSIS — Z8673 Personal history of transient ischemic attack (TIA), and cerebral infarction without residual deficits: Secondary | ICD-10-CM | POA: Diagnosis not present

## 2022-10-18 DIAGNOSIS — Z79899 Other long term (current) drug therapy: Secondary | ICD-10-CM | POA: Diagnosis not present

## 2022-10-18 DIAGNOSIS — E1165 Type 2 diabetes mellitus with hyperglycemia: Secondary | ICD-10-CM | POA: Insufficient documentation

## 2022-10-18 DIAGNOSIS — G4733 Obstructive sleep apnea (adult) (pediatric): Secondary | ICD-10-CM

## 2022-10-18 DIAGNOSIS — E11649 Type 2 diabetes mellitus with hypoglycemia without coma: Secondary | ICD-10-CM | POA: Diagnosis present

## 2022-10-18 DIAGNOSIS — I629 Nontraumatic intracranial hemorrhage, unspecified: Secondary | ICD-10-CM

## 2022-10-18 DIAGNOSIS — Z96653 Presence of artificial knee joint, bilateral: Secondary | ICD-10-CM | POA: Diagnosis not present

## 2022-10-18 DIAGNOSIS — I152 Hypertension secondary to endocrine disorders: Secondary | ICD-10-CM | POA: Diagnosis present

## 2022-10-18 DIAGNOSIS — I619 Nontraumatic intracerebral hemorrhage, unspecified: Secondary | ICD-10-CM

## 2022-10-18 DIAGNOSIS — I10 Essential (primary) hypertension: Secondary | ICD-10-CM | POA: Diagnosis present

## 2022-10-18 DIAGNOSIS — R519 Headache, unspecified: Secondary | ICD-10-CM | POA: Diagnosis present

## 2022-10-18 DIAGNOSIS — E119 Type 2 diabetes mellitus without complications: Secondary | ICD-10-CM | POA: Diagnosis present

## 2022-10-18 DIAGNOSIS — E1122 Type 2 diabetes mellitus with diabetic chronic kidney disease: Secondary | ICD-10-CM | POA: Diagnosis not present

## 2022-10-18 DIAGNOSIS — I129 Hypertensive chronic kidney disease with stage 1 through stage 4 chronic kidney disease, or unspecified chronic kidney disease: Secondary | ICD-10-CM | POA: Diagnosis not present

## 2022-10-18 DIAGNOSIS — Z91040 Latex allergy status: Secondary | ICD-10-CM | POA: Insufficient documentation

## 2022-10-18 DIAGNOSIS — E1169 Type 2 diabetes mellitus with other specified complication: Secondary | ICD-10-CM | POA: Diagnosis present

## 2022-10-18 HISTORY — DX: Nontraumatic intracranial hemorrhage, unspecified: I62.9

## 2022-10-18 LAB — URINALYSIS, ROUTINE W REFLEX MICROSCOPIC
Bilirubin Urine: NEGATIVE
Glucose, UA: NEGATIVE mg/dL
Hgb urine dipstick: NEGATIVE
Ketones, ur: NEGATIVE mg/dL
Leukocytes,Ua: NEGATIVE
Nitrite: NEGATIVE
Protein, ur: NEGATIVE mg/dL
Specific Gravity, Urine: 1.006 (ref 1.005–1.030)
pH: 6.5 (ref 5.0–8.0)

## 2022-10-18 LAB — CBC WITH DIFFERENTIAL/PLATELET
Abs Immature Granulocytes: 0.04 10*3/uL (ref 0.00–0.07)
Basophils Absolute: 0.1 10*3/uL (ref 0.0–0.1)
Basophils Relative: 1 %
Eosinophils Absolute: 0.3 10*3/uL (ref 0.0–0.5)
Eosinophils Relative: 3 %
HCT: 45.4 % (ref 36.0–46.0)
Hemoglobin: 15.3 g/dL — ABNORMAL HIGH (ref 12.0–15.0)
Immature Granulocytes: 0 %
Lymphocytes Relative: 28 %
Lymphs Abs: 2.9 10*3/uL (ref 0.7–4.0)
MCH: 30.6 pg (ref 26.0–34.0)
MCHC: 33.7 g/dL (ref 30.0–36.0)
MCV: 90.8 fL (ref 80.0–100.0)
Monocytes Absolute: 0.9 10*3/uL (ref 0.1–1.0)
Monocytes Relative: 9 %
Neutro Abs: 6.4 10*3/uL (ref 1.7–7.7)
Neutrophils Relative %: 59 %
Platelets: 242 10*3/uL (ref 150–400)
RBC: 5 MIL/uL (ref 3.87–5.11)
RDW: 13.4 % (ref 11.5–15.5)
WBC: 10.7 10*3/uL — ABNORMAL HIGH (ref 4.0–10.5)
nRBC: 0 % (ref 0.0–0.2)

## 2022-10-18 LAB — TROPONIN I (HIGH SENSITIVITY): Troponin I (High Sensitivity): 7 ng/L (ref <18)

## 2022-10-18 LAB — CBG MONITORING, ED
Glucose-Capillary: 86 mg/dL (ref 70–99)
Glucose-Capillary: 114 mg/dL — ABNORMAL HIGH (ref 70–99)
Glucose-Capillary: 92 mg/dL (ref 70–99)

## 2022-10-18 LAB — COMPREHENSIVE METABOLIC PANEL
ALT: 23 U/L (ref 0–44)
AST: 28 U/L (ref 15–41)
Albumin: 4.5 g/dL (ref 3.5–5.0)
Alkaline Phosphatase: 42 U/L (ref 38–126)
Anion gap: 11 (ref 5–15)
BUN: 21 mg/dL (ref 8–23)
CO2: 29 mmol/L (ref 22–32)
Calcium: 9.9 mg/dL (ref 8.9–10.3)
Chloride: 102 mmol/L (ref 98–111)
Creatinine, Ser: 1.14 mg/dL — ABNORMAL HIGH (ref 0.44–1.00)
GFR, Estimated: 49 mL/min — ABNORMAL LOW (ref >60)
Glucose, Bld: 43 mg/dL — CL (ref 70–99)
Potassium: 3.3 mmol/L — ABNORMAL LOW (ref 3.5–5.1)
Sodium: 142 mmol/L (ref 135–145)
Total Bilirubin: 1.1 mg/dL (ref 0.3–1.2)
Total Protein: 7.7 g/dL (ref 6.5–8.1)

## 2022-10-18 LAB — APTT: aPTT: 31 seconds (ref 24–36)

## 2022-10-18 LAB — ETHANOL: Alcohol, Ethyl (B): 10 mg/dL — ABNORMAL HIGH (ref <10)

## 2022-10-18 LAB — PROTIME-INR
INR: 1 (ref 0.8–1.2)
Prothrombin Time: 12.9 seconds (ref 11.4–15.2)

## 2022-10-18 MED ORDER — CLEVIDIPINE BUTYRATE 0.5 MG/ML IV EMUL: 0.0000 mg/h | INTRAVENOUS | Status: DC

## 2022-10-18 MED ORDER — IOHEXOL 350 MG/ML SOLN
100.0000 mL | Freq: Once | INTRAVENOUS | Status: AC | PRN
  Administered 2022-10-18: 80 mL via INTRAVENOUS

## 2022-10-18 MED ORDER — GABAPENTIN 300 MG PO CAPS
300.0000 mg | ORAL_CAPSULE | Freq: Three times a day (TID) | ORAL | Status: DC
  Administered 2022-10-18 – 2022-10-20 (×6): 300 mg via ORAL
  Filled 2022-10-18 (×6): qty 1

## 2022-10-18 MED ORDER — HYDRALAZINE HCL 10 MG PO TABS
10.0000 mg | ORAL_TABLET | ORAL | Status: AC
  Administered 2022-10-18: 10 mg via ORAL
  Filled 2022-10-18: qty 1

## 2022-10-18 MED ORDER — ACETAMINOPHEN 500 MG PO TABS
1000.0000 mg | ORAL_TABLET | ORAL | Status: AC
  Administered 2022-10-18: 1000 mg via ORAL
  Filled 2022-10-18: qty 2

## 2022-10-18 MED ORDER — TRAMADOL HCL 50 MG PO TABS
50.0000 mg | ORAL_TABLET | ORAL | Status: AC
  Administered 2022-10-18: 50 mg via ORAL
  Filled 2022-10-18: qty 1

## 2022-10-18 MED ORDER — GADOBUTROL 1 MMOL/ML IV SOLN
10.0000 mL | Freq: Once | INTRAVENOUS | Status: AC | PRN
  Administered 2022-10-18: 10 mL via INTRAVENOUS

## 2022-10-18 NOTE — H&P (Incomplete)
History and Physical    [name]:[id number] DOB: 10/07/1941 DOA: 10/18/2022  PCP: Tilda Franco, FNP  Patient coming from: Home  I have personally briefly reviewed patient's old medical records in [place]  Chief Complaint: Persistent headache  HPI: [name] is a 81 y.o. female with medical history significant for T2DM, HTN, HLD, CKD stage IIIa, history of TIA on aspirin/Plavix who presented to the ED for evaluation of persistent headache.  ***  ED Course  Labs/Imaging on admission: I have personally reviewed following labs and imaging studies.  ***  Review of Systems: All systems reviewed and are negative except as documented in history of present illness above.   Past Medical History:  Diagnosis Date  . Abdominal pain   . Arthritis    cervical disc degeneration/ oa left knee, carpal tunnel rt wrist, adhesive capsulitis right shoulder, rt hand weakness; lumbar degeneration  . Carpal tunnel syndrome   . 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 Constipation   . Diabetes mellitus without complication ([place])    borderline - diet control  . Diverticulitis hx of  . Diverticulosis   . E. coli infection    2020  . Frequent UTI    hx of urethral injury during colon surgery - states frequent uti's since  . GERD (gastroesophageal reflux disease)   . H/O hiatal hernia   . History of palpitations    in the past  . History of shingles    has a lingering itching on back where shingles were  . Hyperlipidemia   . Hypertension   . Numbness and tingling in right hand    pt. states has numbness of right hand very frequently-watch positioning  . Obesity   . Osteoarthritis   . Osteoporosis   . Pain    pain left knee and pain right hip and right groin  . Personal history of colonic  polyps-adenoma 08/26/2008  . Pneumonia 2005  . Shortness of breath 09/14/2021  . Vitamin D deficiency     Past Surgical History:  Procedure Laterality Date  . ABDOMINAL HYSTERECTOMY    . bladder tack    . BREAST BIOPSY Left   . BREAST SURGERY     breast duct resection- benign  . CARPAL TUNNEL RELEASE Right 06/04/2014   Procedure: RIGHT CARPAL TUNNEL RELEASE;  Surgeon: Roseanne Kaufman, MD;  Location: [place];  Service: Orthopedics;  Laterality: Right;  . COLON RESECTION  2008   hx diverticulosis  . COLON SURGERY    . COLONOSCOPY    . colonoscopy [id number]    . JOINT REPLACEMENT    . OOPHORECTOMY    . POLYPECTOMY    . skin graft left arm - traumatic compression injury left upper arm    . temporary ureter stent    . TOTAL KNEE ARTHROPLASTY  01/05/2012   Procedure: TOTAL KNEE ARTHROPLASTY;  Surgeon: [name], MD;  Location: [facility];  Service: Orthopedics;  Laterality: Right;  . TOTAL KNEE ARTHROPLASTY Left 08/17/2014   Procedure: LEFT TOTAL KNEE ARTHROPLASTY;  Surgeon: [name], MD;  Location: [facility];  Service: Orthopedics;  Laterality: Left;  . ureter repair for tyransected left ureter      Social History:  reports that she has never smoked. She has never used  smokeless tobacco. She reports that she does not drink alcohol and does not use drugs.  Allergies  Allergen Reactions  . Bee Venom Hives  . Hydrocodone     hallucinations  . Latex Hives and Itching  . Oxycodone Other (See Comments)    hallucinations    Family History  Problem Relation Age of Onset  . Breast cancer Mother 50  . Hypertension Mother   . Prostate cancer Father   . Hypertension Father   . Dementia Sister   . Lung cancer Brother   . Hypertension Brother   . COPD Brother   . Cancer Maternal Grandmother   . Arthritis Sister   . Hypertension Sister   . Arthritis Sister   . Hypertension Child   . Hypertension Child   . Hypertension Child   . Colon polyps Neg Hx    . Esophageal cancer Neg Hx   . Rectal cancer Neg Hx   . Stomach cancer Neg Hx   . Colon cancer Neg Hx      Prior to Admission medications   Medication Sig Start Date End Date Taking? Authorizing Provider  allopurinol (ZYLOPRIM) 100 MG tablet Take 100 mg by mouth daily. 05/16/22   [provider]  allopurinol (ZYLOPRIM) 300 MG tablet Take 1 tablet by mouth daily 02/02/22   [name], MD  amLODipine (NORVASC) 5 MG tablet Take 1 tablet (5 mg total) by mouth daily. 10/14/22   Charlesetta Shanks, MD  amLODipine-benazepril (LOTREL) 5-10 MG capsule Take 1 tablet by mouth daily.    [provider]  aspirin EC 81 MG tablet Take 1 tablet (81 mg total) by mouth daily. 02/02/22   [name], MD  atenolol-chlorthalidone (TENORETIC) 50-25 MG tablet Take 1 tablet by mouth daily. 11/07/21   [name], MD  benazepril (LOTENSIN) 10 MG tablet Take 1 tablet by mouth daily.    [provider]  benzonatate (TESSALON) 100 MG capsule     [provider]  bisacodyl (DULCOLAX) 5 MG EC tablet Take 5 mg by mouth daily as needed for moderate constipation.    [provider]  Blood Pressure KIT 1 Units by Does not apply route daily. Check blood pressure daily for hypertension dx code I10 09/14/21   Skeet Latch, MD  cephALEXin [facility]) 250 MG capsule     [provider]  clopidogrel (PLAVIX) 75 MG tablet Take 1 tablet (75 mg total) by mouth daily. 04/21/22   Shawna Clamp, MD  cyanocobalamin 100 MCG tablet Take 1 tablet by mouth every evening.    [provider]  diclofenac Sodium (VOLTAREN) 1 % GEL Apply 4 g topically 4 (four) times daily. Apply to back 11/26/21   Palumbo, April, MD  Dulaglutide (TRULICITY) 0.34 JZ/7.9XT SOPN Inject 0.5 mg into the skin every 14 (fourteen) days.    [provider]  FARXIGA 10 MG TABS tablet Take 10 mg by mouth daily. 05/16/22   [provider]  hydrocortisone cream 1 % Apply to affected area 2  times daily 03/01/22 03/01/23  [name], FNP  hydrOXYzine (ATARAX) 10 MG tablet 10 mg by oral route.    [provider]  Lancets (ONETOUCH DELICA PLUS [id number]) Theresa USE TO CHECK BLOOD SUGAR TWICE DAILY 12/24/20   [name], MD  levocetirizine (XYZAL) 5 MG tablet 5 mg by oral route.    [provider]  levofloxacin (LEVAQUIN) 500 MG tablet Take 1 tablet by mouth daily.    [provider]  Magnesium 250  MG TABS Take 1 tablet (250 mg total) by mouth every evening. Take with dinner meal 08/18/21   [name], MD  montelukast (SINGULAIR) 10 MG tablet SMARTSIG:1 Tablet(s) By Mouth Every Evening 05/16/22   [provider]  Multiple Vitamin (MULTIVITAMIN) tablet Take 1 tablet by mouth daily. 08/18/21   [name], MD  Multiple Vitamins-Minerals (ZINC PO) Take 1 tablet by mouth daily.    [provider]  nitrofurantoin, macrocrystal-monohydrate, (MACROBID) 100 MG capsule     [provider]  ONETOUCH VERIO test strip USE TO TEST 2 TIMES DAILY 12/20/20   [name], MD  [facility], 1 MG/DOSE, 4 MG/3ML SOPN SMARTSIG:1 Milligram(s) Topical Once a Week 05/18/22   [provider]  pantoprazole (PROTONIX) 40 MG tablet     [provider]  rosuvastatin (CRESTOR) 20 MG tablet TAKE 1 TABLET(20 MG) BY MOUTH DAILY Patient taking differently: Take 20 mg by mouth daily. TAKE 1 TABLET(20 MG) BY MOUTH DAILY 02/02/22   [name], MD  Saccharomyces boulardii (PROBIOTIC) 250 MG CAPS 1 tablet by mouth daily 08/18/21   [name], MD  solifenacin (VESICARE) 5 MG tablet Take 5 mg by mouth every morning. 02/09/22   [provider]  tiZANidine (ZANAFLEX) 4 MG tablet Take 0.5 tablets (2 mg total) by mouth every 6 (six) hours as needed for muscle spasms. 11/27/21   Luna Fuse, MD  valsartan (DIOVAN) 80 MG tablet Take 1 tablet (80 mg total) by mouth daily. 09/14/21   Skeet Latch, MD  vitamin B-12 (CYANOCOBALAMIN) 100 MCG  tablet Take 1 tablet by mouth daily 08/18/21   [name], MD  Vitamin D, Ergocalciferol, (DRISDOL) 1.25 MG (50000 UNIT) CAPS capsule TAKE ONE CAPSULE BY MOUTH ONCE WEEKLY BEFORE BREAKFAST ON  TUESDAYS and TAKE ONE CAPSULE BY MOUTH ONCE WEEKLY BEFORE BREAKFAST ON FRIDAYS Patient taking differently: See admin instructions. TAKE ONE CAPSULE BY MOUTH ONCE WEEKLY BEFORE BREAKFAST ON  TUESDAYS and TAKE ONE CAPSULE BY MOUTH ONCE WEEKLY BEFORE BREAKFAST ON FRIDAYS 03/08/22   [name], MD    Physical Exam: Vitals:   10/18/22 2035 10/18/22 2045 10/18/22 2220 10/18/22 2223  BP:  (!) 141/61 117/78 117/78  Pulse: (!) 58 (!) 57 (!) 58 (!) 57  Resp:    17  Temp:      TempSrc:      SpO2: 100% 100% 97% 100%  Weight:      Height:       *** Constitutional: NAD, calm, comfortable Eyes: PERRL, lids and conjunctivae normal ENMT: Mucous membranes are moist. Posterior pharynx clear of any exudate or lesions.Normal dentition.  Neck: normal, supple, no masses. Respiratory: clear to auscultation bilaterally, no wheezing, no crackles. Normal respiratory effort. No accessory muscle use.  Cardiovascular: Regular rate and rhythm, no murmurs / rubs / gallops. No extremity edema. 2+ pedal pulses. Abdomen: no tenderness, no masses palpated. No hepatosplenomegaly. Bowel sounds positive.  Musculoskeletal: no clubbing / cyanosis. No joint deformity upper and lower extremities. Good ROM, no contractures. Normal muscle tone.  Skin: no rashes, lesions, ulcers. No induration Neurologic: CN 2-12 grossly intact. Sensation intact. Strength 5/5 in all 4.  Psychiatric: Normal judgment and insight. Alert and oriented x 3. Normal mood.   EKG: Personally reviewed. ***  Assessment/Plan Principal Problem:   Subacute intracranial hemorrhage (HCC)   *** No notes on file *** Assessment and Plan: No notes have been filed under this hospital service. Service: Hospitalist      DVT prophylaxis: ***  Code Status: ***  Family Communication: ***  Disposition Plan: ***  Consults called: ***  Severity of Illness: {Observation/Inpatient:21159}  Zada Finders MD [facility]  If 7PM-7AM, please contact night-coverage [email or web address]  10/18/2022, 11:43 PM

## 2022-10-18 NOTE — ED Provider Notes (Signed)
On plavix

## 2022-10-18 NOTE — Plan of Care (Signed)
Brief Neuro Note:  Briefly, [name] is a 81 y.o. female who presents with R sided headache that started on Friday. She was seen at [facility] ED and discharged 2 days ago. She returns with recurrence of headache.  CT Head demonstrates a vague hyperdense focus in the R occipital lobe. Differential includes acute IPH vs primary brain mass vs metastatic lesion and rads recommended further imaging with MRI Brain w + w/o C.  Case discussed with me and I agree with getting MRI Brain w + w/o C to clarify the lesion. I also recommended getting CT Angio head and neck and C Venogram while she is still at [facility].  She is not on any anticoagulation and has not hit her head per my discussion with ED team.  Recs: - transfer to [facility] for MRI Brain w + w/o C - CT angio head and neck - CT venogram head - Strictly keep SBP between 130-150. - no antiplatelet, no AC, no DVT until clear what the lesion truly is.   Discussed with ED team.   Donnetta Simpers [facility] Pager Number [phone]

## 2022-10-18 NOTE — H&P (Signed)
History and Physical    [name] KGM:[id number] DOB: Dec 05, 1940 DOA: 10/18/2022  PCP: Tilda Franco, FNP  Patient coming from: Home  I have personally briefly reviewed patient's old medical records in [place]  Chief Complaint: Persistent headache  HPI: [name] is a 81 y.o. female with medical history significant for T2DM, HTN, HLD, CKD stage IIIa, history of TIA on aspirin, OSA on CPAP who presented to the ED for evaluation of persistent headache.  Patient reports new onset of right-sided headache beginning 5 days ago.  She describes sensation as a stabbing pain.  Headache has been on and off.  She says her blood pressure has been running high at home.  She has not had focal weakness, chest pain, dyspnea, nausea, vomiting, abdominal pain.  ED Course  Labs/Imaging on admission: I have personally reviewed following labs and imaging studies.  Initial vitals showed BP 185/72, pulse 54, RR 18, temp 97.8 F, SpO2 97% on room air.  Labs show WBC 10.7, hemoglobin 15.3, platelets 242,000, sodium 142, potassium 3.3, bicarb 29, BUN 21, creatinine 1.14, serum glucose 43, LFTs within normal limits, troponin 7, serum ethanol 10.  Repeat CBGs 114 and 92.  CT head without contrast showed a 1.3 cm hypodense focus within the right occipital lobe.  Neurology consulted and recommended further workup.  CTA head and venogram head negative for intracranial LVO or vascular malformation.  Moderate stenosis in bilateral cavernous segments and mild stenosis in bilateral supraclinoid segment seen.  Moderate stenosis in the right mid V4 segment.  No evidence of venous sinus thrombosis or stenosis.  MRI brain with and without contrast shows an early subacute hemorrhage in the right occipital lobe with volume of approximately 1 mm.  Surrounding edema without significant mass effect or midline shift noted.  No evidence of active hemorrhage.  Multiple foci of hemosiderin deposition, most  prominently in the parietal and occipital lobes noted.  EDP discussed with on-call neurosurgery, who recommended no further neurosurgical needs.  Neurology recommending admission for BP and pain control given high risk.  The hospitalist service was consulted to admit for further evaluation and management.  Review of Systems: All systems reviewed and are negative except as documented in history of present illness above.   Past Medical History:  Diagnosis Date   Abdominal pain    Arthritis    cervical disc degeneration/ oa left knee, carpal tunnel rt wrist, adhesive capsulitis right shoulder, rt hand weakness; lumbar degeneration   Carpal tunnel syndrome    Complication of anesthesia 2006-at [facility]   breathing problems-no BP med given prior to surgery;  hx of being very sleepy after colon surgery --  states no problems with last right total knee replacement 2013   Constipation    Diabetes mellitus without complication (HCC)    borderline - diet control   Diverticulitis hx of   Diverticulosis    E. coli infection    2020   Frequent UTI    hx of urethral injury during colon surgery - states frequent uti's since   GERD (gastroesophageal reflux disease)    H/O hiatal hernia    History of palpitations    in the past   History of shingles    has a lingering itching on back where shingles were   Hyperlipidemia    Hypertension    Numbness and tingling in right hand    pt. states has numbness of right hand very frequently-watch positioning   Obesity  Osteoarthritis    Osteoporosis    Pain    pain left knee and pain right hip and right groin   Personal history of colonic polyps-adenoma 08/26/2008   Pneumonia 2005   Shortness of breath 09/14/2021   Vitamin D deficiency     Past Surgical History:  Procedure Laterality Date   ABDOMINAL HYSTERECTOMY     bladder tack     BREAST BIOPSY Left    BREAST SURGERY     breast duct resection- benign   CARPAL TUNNEL RELEASE Right 06/04/2014    Procedure: RIGHT CARPAL TUNNEL RELEASE;  Surgeon: Roseanne Kaufman, MD;  Location: [facility];  Service: Orthopedics;  Laterality: Right;   COLON RESECTION  2008   hx diverticulosis   COLON SURGERY     COLONOSCOPY     colonoscopy [id number]     JOINT REPLACEMENT     OOPHORECTOMY     POLYPECTOMY     skin graft left arm - traumatic compression injury left upper arm     temporary ureter stent     TOTAL KNEE ARTHROPLASTY  01/05/2012   Procedure: TOTAL KNEE ARTHROPLASTY;  Surgeon: [name], MD;  Location: [facility];  Service: Orthopedics;  Laterality: Right;   TOTAL KNEE ARTHROPLASTY Left 08/17/2014   Procedure: LEFT TOTAL KNEE ARTHROPLASTY;  Surgeon: [name], MD;  Location: [facility];  Service: Orthopedics;  Laterality: Left;   ureter repair for tyransected left ureter      Social History:  reports that she has never smoked. She has never used smokeless tobacco. She reports that she does not drink alcohol and does not use drugs.  Allergies  Allergen Reactions   Bee Venom Hives   Codeine Other (See Comments)    Hallucinations    Hydrocodone     hallucinations   Latex Hives and Itching   Oxycodone Other (See Comments)    hallucinations    Family History  Problem Relation Age of Onset   Breast cancer Mother 57   Hypertension Mother    Prostate cancer Father    Hypertension Father    Dementia Sister    Lung cancer Brother    Hypertension Brother    COPD Brother    Cancer Maternal Grandmother    Arthritis Sister    Hypertension Sister    Arthritis Sister    Hypertension Child    Hypertension Child    Hypertension Child    Colon polyps Neg Hx    Esophageal cancer Neg Hx    Rectal cancer Neg Hx    Stomach cancer Neg Hx    Colon cancer Neg Hx      Prior to Admission medications   Medication Sig Start Date End Date Taking? Authorizing Provider  allopurinol (ZYLOPRIM) 100 MG tablet Take 100 mg by mouth daily. 05/16/22   [provider]  allopurinol (ZYLOPRIM) 300 MG tablet Take 1 tablet by mouth daily 02/02/22   [name], MD  amLODipine (NORVASC) 5 MG tablet Take 1 tablet (5 mg total) by mouth daily. 10/14/22   Charlesetta Shanks, MD  amLODipine-benazepril (LOTREL) 5-10 MG capsule Take 1 tablet by mouth daily.    [provider]  aspirin EC 81 MG tablet Take 1 tablet (81 mg total) by mouth daily. 02/02/22   [name], MD  atenolol-chlorthalidone (TENORETIC) 50-25 MG tablet Take 1 tablet by mouth daily. 11/07/21   [name], MD  benazepril (LOTENSIN) 10 MG tablet Take 1 tablet by mouth daily.  [provider]  benzonatate (TESSALON) 100 MG capsule     [provider]  bisacodyl (DULCOLAX) 5 MG EC tablet Take 5 mg by mouth daily as needed for moderate constipation.    [provider]  Blood Pressure KIT 1 Units by Does not apply route daily. Check blood pressure daily for hypertension dx code I10 09/14/21   Skeet Latch, MD  cephALEXin [facility]) 250 MG capsule     [provider]  clopidogrel (PLAVIX) 75 MG tablet Take 1 tablet (75 mg total) by mouth daily. 04/21/22   Shawna Clamp, MD  cyanocobalamin 100 MCG tablet Take 1 tablet by mouth every evening.    [provider]  diclofenac Sodium (VOLTAREN) 1 % GEL Apply 4 g topically 4 (four) times daily. Apply to back 11/26/21   Palumbo, April, MD  Dulaglutide (TRULICITY) 4.48 JE/5.6DJ SOPN Inject 0.5 mg into the skin every 14 (fourteen) days.    [provider]  FARXIGA 10 MG TABS tablet Take 10 mg by mouth daily. 05/16/22   [provider]  hydrocortisone cream 1 % Apply to affected area 2 times daily 03/01/22 03/01/23  [name], FNP  hydrOXYzine (ATARAX) 10 MG tablet 10 mg by oral route.    [provider]  Lancets (ONETOUCH DELICA PLUS [id number]) George USE TO CHECK BLOOD SUGAR TWICE DAILY 12/24/20   [name], MD  levocetirizine (XYZAL) 5 MG tablet 5 mg by oral route.    [provider]  levofloxacin (LEVAQUIN) 500 MG tablet Take 1 tablet by mouth daily.    [provider]  Magnesium 250 MG TABS Take 1 tablet (250 mg total) by mouth every evening. Take with dinner meal 08/18/21   [name], MD  montelukast (SINGULAIR) 10 MG tablet SMARTSIG:1 Tablet(s) By Mouth Every Evening 05/16/22   [provider]  Multiple Vitamin (MULTIVITAMIN) tablet Take 1 tablet by mouth daily. 08/18/21   [name], MD  Multiple Vitamins-Minerals (ZINC PO) Take 1 tablet by mouth daily.    [provider]  nitrofurantoin, macrocrystal-monohydrate, (MACROBID) 100 MG capsule     [provider]  ONETOUCH VERIO test strip USE TO TEST 2 TIMES DAILY 12/20/20   [name], MD  [facility], 1 MG/DOSE, 4 MG/3ML SOPN SMARTSIG:1 Milligram(s) Topical Once a Week 05/18/22   [provider]  pantoprazole (PROTONIX) 40 MG tablet     [provider]  rosuvastatin (CRESTOR) 20 MG tablet TAKE 1 TABLET(20 MG) BY MOUTH DAILY Patient taking differently: Take 20 mg by mouth daily. TAKE 1 TABLET(20 MG) BY MOUTH DAILY 02/02/22   [name], MD  Saccharomyces boulardii (PROBIOTIC) 250 MG CAPS 1 tablet by mouth daily 08/18/21   [name], MD  solifenacin (VESICARE) 5 MG tablet Take 5 mg by mouth every morning. 02/09/22   [provider]  tiZANidine (ZANAFLEX) 4 MG tablet Take 0.5 tablets (2 mg total) by mouth every 6 (six) hours as needed for muscle spasms. 11/27/21   Luna Fuse, MD  valsartan (DIOVAN) 80 MG tablet Take 1 tablet (80 mg total) by mouth daily. 09/14/21   Skeet Latch, MD  vitamin B-12 (CYANOCOBALAMIN) 100 MCG tablet Take 1 tablet by mouth daily 08/18/21   [name], MD  Vitamin D, Ergocalciferol, (DRISDOL) 1.25 MG (50000 UNIT) CAPS capsule TAKE ONE CAPSULE BY MOUTH ONCE WEEKLY BEFORE BREAKFAST ON  TUESDAYS and TAKE ONE CAPSULE BY MOUTH ONCE WEEKLY BEFORE BREAKFAST ON FRIDAYS Patient taking differently: See admin  instructions. TAKE ONE  CAPSULE BY MOUTH ONCE WEEKLY BEFORE BREAKFAST ON  TUESDAYS and TAKE ONE CAPSULE BY MOUTH ONCE WEEKLY BEFORE BREAKFAST ON FRIDAYS 03/08/22   [name], MD    Physical Exam: Vitals:   10/18/22 2045 10/18/22 2220 10/18/22 2223 10/19/22 0015  BP: (!) 141/61 117/78 117/78 (!) 138/50  Pulse: (!) 57 (!) 58 (!) 57 (!) 54  Resp:   17 18  Temp:      TempSrc:      SpO2: 100% 97% 100% 98%  Weight:      Height:       Constitutional: Resting supine in bed, NAD, calm, comfortable Eyes: EOMI, lids and conjunctivae normal ENMT: Mucous membranes are moist. Posterior pharynx clear of any exudate or lesions.Normal dentition.  Neck: normal, supple, no masses. Respiratory: clear to auscultation bilaterally, no wheezing, no crackles. Normal respiratory effort. No accessory muscle use.  Cardiovascular: Mild bradycardia, regular rhythm, no murmurs / rubs / gallops. No extremity edema. 2+ pedal pulses. Abdomen: no tenderness, no masses palpated. No hepatosplenomegaly. Bowel sounds positive.  Musculoskeletal: no clubbing / cyanosis. No joint deformity upper and lower extremities. Good ROM, no contractures. Normal muscle tone.  Skin: no rashes, lesions, ulcers. No induration Neurologic: CN 2-12 grossly intact. Sensation intact. Strength 5/5 in all 4.  Psychiatric: Alert and oriented x 3. Normal mood.   EKG: Personally reviewed. Sinus rhythm, rate 56, LBBB. Similar to prior.  Assessment/Plan Principal Problem:   Subacute intracranial hemorrhage (HCC) Active Problems:   Essential hypertension   Type 2 diabetes mellitus with hypoglycemia (HCC)   Chronic kidney disease, stage 3a (HCC)   OSA on CPAP   Hypokalemia   [name] is a 81 y.o. female with medical history significant for T2DM, HTN, HLD, CKD stage IIIa, history of TIA on aspirin/Plavix who is admitted for subacute intracranial hemorrhage of the right occipital lobe.  Assessment and Plan: * Subacute intracranial  hemorrhage (HCC) MRI brain shows early subacute hemorrhage in the right occipital lobe, involving approximately 1 mm.  Multiple foci of hemosiderin deposition also noted, could be sequela of hypertensive microhemorrhages, early cerebral amyloid angiopathy, or multiple cavernoma's.  Cleared from neurosurgery standpoint per EDP conversation.  Neurology recommended observation overnight for BP control. -Resume home amlodipine, atenolol-chlorthalidone, valsartan -IV hydralazine if needed for SBP >160 -Started on gabapentin for headache per neurology -Hold aspirin for now  Essential hypertension Resume home amlodipine, atenolol-chlorthalidone, valsartan.  Goal SBP <160 per neurology as above.  Type 2 diabetes mellitus with hypoglycemia (HCC) Initial serum glucose was 43.  CBGs improved with oral treatment. -Holding glipizide  Hypokalemia Oral supplement ordered.  OSA on CPAP Patient reports inconsistent use.  Could be contributing to uncontrolled hypertension. -Continue CPAP nightly  Chronic kidney disease, stage 3a (HCC) Stable, continue to monitor.  DVT prophylaxis: SCDs Start: 10/19/22 0039 Code Status: Full code, confirmed with patient on admission Family Communication: Ebony Hail daughter at bedside Disposition Plan: From home, dispo pending clinical progress Consults called: Neurology Severity of Illness: The appropriate patient status for this patient is OBSERVATION. Observation status is judged to be reasonable and necessary in order to provide the required intensity of service to ensure the patient's safety. The patient's presenting symptoms, physical exam findings, and initial radiographic and laboratory data in the context of their medical condition is felt to place them at decreased risk for further clinical deterioration. Furthermore, it is anticipated that the patient will be medically stable for discharge from the hospital within 2 midnights of admission.   Cheryl Rothman  Posey Pronto  MD [facility]  If 7PM-7AM, please contact night-coverage [email or web address]  10/19/2022, 12:48 AM

## 2022-10-18 NOTE — ED Triage Notes (Signed)
Patient BIB EMS from [facility] for MRI for ongoing headache. Ambulatory., VSS

## 2022-10-18 NOTE — ED Provider Notes (Signed)
[facility] EMERGENCY DEPT Provider Note   CSN: [id number] Arrival date & time: 10/18/22  1151     History  Chief Complaint  Patient presents with   Headache    [name] is a 81 y.o. female with a past medical history of TIA, hypertension, who presents emergency department with concerns for headache onset 5 days.  Patient notes that her headache feels like there is a knife sticking in her brain.  Headache radiates from the right neck to the right side of her head to the frontal region.  Patient takes Plavix.  No medications tried at home.  Notes that she was seen in the emergency department on the day of her symptoms started and was placed on amlodipine at that time.  Notes that she had some sternal/left-sided chest pain on her previous ED visit that resolved however notes that the pain was back at this time.  Denies shortness of breath, dizziness, lightheadedness, nausea, vomiting, back pain, numbness, tingling, weakness.   Per patient chart review: Pt was seen in the ED on 10/14/2022 for similar symptoms.  At that time patient had negative laboratory workup.  Patient did not have a CT head completed at that time.  Patient was also evaluated in the emergency department on 10/16/2022 for similar symptoms.  At that time a CT head was not ordered.  Patient eloped after triage.  The history is provided by the patient. No language interpreter was used.       Home Medications Prior to Admission medications   Medication Sig Start Date End Date Taking? Authorizing Provider  allopurinol (ZYLOPRIM) 100 MG tablet Take 100 mg by mouth daily. 05/16/22   [provider]  allopurinol (ZYLOPRIM) 300 MG tablet Take 1 tablet by mouth daily 02/02/22   [name], MD  amLODipine (NORVASC) 5 MG tablet Take 1 tablet (5 mg total) by mouth daily. 10/14/22   Charlesetta Shanks, MD  amLODipine-benazepril (LOTREL) 5-10 MG capsule Take 1 tablet by mouth daily.    [provider]  aspirin EC 81 MG tablet Take 1 tablet (81 mg total) by mouth daily. 02/02/22   [name], MD  atenolol-chlorthalidone (TENORETIC) 50-25 MG tablet Take 1 tablet by mouth daily. 11/07/21   [name], MD  benazepril (LOTENSIN) 10 MG tablet Take 1 tablet by mouth daily.    [provider]  benzonatate (TESSALON) 100 MG capsule     [provider]  bisacodyl (DULCOLAX) 5 MG EC tablet Take 5 mg by mouth daily as needed for moderate constipation.    [provider]  Blood Pressure KIT 1 Units by Does not apply route daily. Check blood pressure daily for hypertension dx code I10 09/14/21   Skeet Latch, MD  cephALEXin [facility]) 250 MG capsule     [provider]  clopidogrel (PLAVIX) 75 MG tablet Take 1 tablet (75 mg total) by mouth daily. 04/21/22   Shawna Clamp, MD  cyanocobalamin 100 MCG tablet Take 1 tablet by mouth every evening.    [provider]  diclofenac Sodium (VOLTAREN) 1 % GEL Apply 4 g topically 4 (four) times daily. Apply to back 11/26/21   Palumbo, April, MD  Dulaglutide (TRULICITY) 5.44 BE/0.1EO SOPN Inject 0.5 mg into the skin every 14 (fourteen) days.    [provider]  FARXIGA 10 MG TABS tablet Take 10 mg by mouth daily. 05/16/22   [provider]  hydrocortisone cream 1 % Apply to affected area 2 times daily 03/01/22  03/01/23  [name], FNP  hydrOXYzine (ATARAX) 10 MG tablet 10 mg by oral route.    [provider]  Lancets (ONETOUCH DELICA PLUS [id number]) [place] USE TO CHECK BLOOD SUGAR TWICE DAILY 12/24/20   [name], MD  levocetirizine (XYZAL) 5 MG tablet 5 mg by oral route.    [provider]  levofloxacin (LEVAQUIN) 500 MG tablet Take 1 tablet by mouth daily.    [provider]  Magnesium 250 MG TABS Take 1 tablet (250 mg total) by mouth every evening. Take with dinner meal 08/18/21   [name], MD  montelukast (SINGULAIR) 10 MG tablet SMARTSIG:1 Tablet(s)  By Mouth Every Evening 05/16/22   [provider]  Multiple Vitamin (MULTIVITAMIN) tablet Take 1 tablet by mouth daily. 08/18/21   [name], MD  Multiple Vitamins-Minerals (ZINC PO) Take 1 tablet by mouth daily.    [provider]  nitrofurantoin, macrocrystal-monohydrate, (MACROBID) 100 MG capsule     [provider]  ONETOUCH VERIO test strip USE TO TEST 2 TIMES DAILY 12/20/20   [name], MD  [facility], 1 MG/DOSE, 4 MG/3ML SOPN SMARTSIG:1 Milligram(s) Topical Once a Week 05/18/22   [provider]  pantoprazole (PROTONIX) 40 MG tablet     [provider]  rosuvastatin (CRESTOR) 20 MG tablet TAKE 1 TABLET(20 MG) BY MOUTH DAILY Patient taking differently: Take 20 mg by mouth daily. TAKE 1 TABLET(20 MG) BY MOUTH DAILY 02/02/22   [name], MD  Saccharomyces boulardii (PROBIOTIC) 250 MG CAPS 1 tablet by mouth daily 08/18/21   [name], MD  solifenacin (VESICARE) 5 MG tablet Take 5 mg by mouth every morning. 02/09/22   [provider]  tiZANidine (ZANAFLEX) 4 MG tablet Take 0.5 tablets (2 mg total) by mouth every 6 (six) hours as needed for muscle spasms. 11/27/21   Luna Fuse, MD  valsartan (DIOVAN) 80 MG tablet Take 1 tablet (80 mg total) by mouth daily. 09/14/21   Skeet Latch, MD  vitamin B-12 (CYANOCOBALAMIN) 100 MCG tablet Take 1 tablet by mouth daily 08/18/21   [name], MD  Vitamin D, Ergocalciferol, (DRISDOL) 1.25 MG (50000 UNIT) CAPS capsule TAKE ONE CAPSULE BY MOUTH ONCE WEEKLY BEFORE BREAKFAST ON  TUESDAYS and TAKE ONE CAPSULE BY MOUTH ONCE WEEKLY BEFORE BREAKFAST ON FRIDAYS Patient taking differently: See admin instructions. TAKE ONE CAPSULE BY MOUTH ONCE WEEKLY BEFORE BREAKFAST ON  TUESDAYS and TAKE ONE CAPSULE BY MOUTH ONCE WEEKLY BEFORE BREAKFAST ON FRIDAYS 03/08/22   [name], MD      Allergies    Bee venom, Hydrocodone-acetaminophen, Latex, Oxycodone, and Vicodin [hydrocodone-acetaminophen]     Review of Systems   Review of Systems  Neurological:  Positive for headaches.  All other systems reviewed and are negative.   Physical Exam Updated Vital Signs BP (!) 185/72   Pulse (!) 54   Temp 97.8 F (36.6 C) (Oral)   Resp 18   Ht _0  (1.676 m)   Wt 105.7 kg   SpO2 97%   BMI 37.61 kg/m  Physical Exam Vitals and nursing note reviewed.  Constitutional:      General: She is not in acute distress.    Appearance: She is not diaphoretic.  HENT:     Head: Normocephalic and atraumatic.     Mouth/Throat:     Pharynx: No oropharyngeal exudate.  Eyes:     General: No scleral icterus.    Conjunctiva/sclera: Conjunctivae normal.  Cardiovascular:     Rate and Rhythm:  Normal rate and regular rhythm.     Pulses: Normal pulses.     Heart sounds: Normal heart sounds.  Pulmonary:     Effort: Pulmonary effort is normal. No respiratory distress.     Breath sounds: Normal breath sounds. No wheezing.  Abdominal:     General: Bowel sounds are normal.     Palpations: Abdomen is soft. There is no mass.     Tenderness: There is no abdominal tenderness. There is no guarding or rebound.  Musculoskeletal:        General: Normal range of motion.     Cervical back: Normal range of motion and neck supple.     Comments: Able to ambulate without assistance or difficulty  Skin:    General: Skin is warm and dry.  Neurological:     Mental Status: She is alert.     Comments: No focal neurological deficits. Negative pronator drift. Able to ambulate without assistance or difficulty. Strength and sensation intact to BUE and BLE. Grip strength 5/5 bilaterally.  Normal finger-nose testing.  Normal heel-to-shin testing.  Cranial nerves II through XII intact.  Psychiatric:        Behavior: Behavior normal.     ED Results / Procedures / Treatments   Labs (all labs ordered are listed, but only abnormal results are displayed) Labs Reviewed - No data to display  EKG None  Radiology CT Head Wo  Contrast  Result Date: 10/18/2022 CLINICAL DATA:  Provided history: Headache, new onset. Right-sided head pain which began on Friday. EXAM: CT HEAD WITHOUT CONTRAST TECHNIQUE: Contiguous axial images were obtained from the base of the skull through the vertex without intravenous contrast. RADIATION DOSE REDUCTION: This exam was performed according to the departmental dose-optimization program which includes automated exposure control, adjustment of the mA and/or kV according to patient size and/or use of iterative reconstruction technique. COMPARISON:  CT angiogram head/neck 04/18/2022. Brain MRI 04/17/2022. Head CT 04/17/2022. FINDINGS: Brain: No age advanced or lobar predominant parenchymal atrophy. 1.3 cm hyperdense focus within the right occipital lobe (for instance as seen on series 2, image 14) (series 4, image 9) (series 5, image 22). Moderate patchy and ill-defined hypoattenuation within the cerebral white matter, nonspecific but compatible with chronic small vessel disease. Partially empty sella turcica. No demarcated cortical infarct. No extra-axial fluid collection. No midline shift. Vascular: No hyperdense vessel.  Atherosclerotic calcifications. Skull: No fracture or aggressive osseous lesion. Sinuses/Orbits: No mass or acute finding within the imaged orbits. No significant paranasal sinus disease at the imaged levels. These results were called by telephone at the time of interpretation on 10/18/2022 at 2:40 pm to provider [facility] , who verbally acknowledged these results. IMPRESSION: 1.3 cm hyperdense focus within the right occipital lobe. This may reflect an acute parenchymal hemorrhage. However, a brain MRI without and with contrast is recommended to exclude a primary mass or metastatic lesion. Moderate chronic small ischemic changes within the cerebral white matter. Electronically Signed   By: Kellie Simmering D.O.   On: 10/18/2022 14:46    Procedures Procedures    Medications Ordered  in ED Medications - No data to display  ED Course/ Medical Decision Making/ A&P Clinical Course as of 10/18/22 2207  Wed Oct 18, 2022  1539 Consult with Neurosurgery, Dr. Kathyrn Sheriff who recommends consult to neuro. Also will be available for consult if there is a mass noted on MRI.  [SB]  1621 Glucose(!!): 43 Notified by RN of patient with hypoglycemia, patient mentating well.  Patient provided with juice. [SB]  1709 Patient reevaluated and discussed with patient lab findings. Answered all available questions.  [SB]  7096 Consult with neurologist, Dr. Lorrin Goodell who recommends CTA, CT venogram. Also recommends blood pressure to remain strictly from 283-662 systolic. Recommends if bleed noted on MRI to consult Neuro. [SB]  1836 Discussed with Dr. Michel Harrow in [place] at [facility] who accept the patient in transfer. [SB]    Clinical Course User Index [SB] Mayo Faulk A, PA-C                           Medical Decision Making Amount and/or Complexity of Data Reviewed Labs: ordered. Decision-making details documented in ED Course. Radiology: ordered.  Risk Prescription drug management.   Pt presented to the ED with right-sided headache onset 5 days.  Patient is currently on Plavix.  No vision changes noted at this time.  Patient afebrile.  No acute focal neurological deficits noted on exam. Differential diagnosis includes SAH, ICH, Hypertensive urgency, Hypertensive emergency, migraine, tension headache.    Labs:  I ordered, and personally interpreted labs.  The pertinent results include:   Troponin at 7 CBC with leukocytosis at 10.7 CMP with slightly decreased potassium at 3.3, glucose of 43 Urinalysis unremarkable  Imaging: I ordered imaging studies including CT head I independently visualized and interpreted imaging which showed:  1.3 cm hyperdense focus within the right occipital lobe. This may  reflect an acute parenchymal hemorrhage. However, a brain MRI  without  and with contrast is recommended to exclude a primary mass  or metastatic lesion.    Moderate chronic small ischemic changes within the cerebral white  matter.   I agree with the radiologist interpretation   Consultations: I requested consultation with the neurosurgeon, Dr. Kathyrn Sheriff, and discussed lab and imaging findings as well as pertinent plan - they recommend: Consultation to neuro.  Also agreeable with MRI brain. -Consultation with neurologist, Dr. Lorrin Goodell who recommends CTA, CT venogram, blood pressure recommendations (and ED course).  Also transferred to [facility] for MRI.   Disposition: Presentation concerning for parenchymal hemorrhage.  However unable to rule out other etiologies such as mass due to findings on CT scan today.  Patient will need to be transferred to [facility] for MRI brain and further workup. After consideration of the diagnostic results and the patients response to treatment, I feel that the patient would benefit from Transfer to.  Patient and significant other are agreeable with transfer to [facility] at this time for further workup.  This chart was dictated using voice recognition software, Dragon. Despite the best efforts of this provider to proofread and correct errors, errors may still occur which can change documentation meaning.  Final Clinical Impression(s) / ED Diagnoses Final diagnoses:  None    Rx / DC Orders ED Discharge Orders     None         Julliana Whitmyer A, PA-C 10/18/22 2215    Cristie Hem, MD 10/19/22 [phone]

## 2022-10-18 NOTE — ED Notes (Signed)
EDP notified of low blood glucose; pt provided graham crackers, peanut butter, sprite & cranberry juice

## 2022-10-18 NOTE — ED Triage Notes (Addendum)
Patient here POV from Home.  Endorses Right Sided Head Pain that began Friday. Seen then and discharged. States Pain has continued and went to another ED 2 Days ago but LWBS due to Wait. Seeks Evaluation today for continued Pain. States Pain is Intermittent and usually occurs in intervals of 10 Seconds.   No Neurological Deficits noted during Triage.   NAD Noted during Triage. A&Ox4. GCS 15. Ambulatory.

## 2022-10-18 NOTE — ED Notes (Signed)
Report given to [facility] via phone 

## 2022-10-18 NOTE — ED Notes (Signed)
Patient ready for MRI, MRI aware.

## 2022-10-18 NOTE — ED Notes (Signed)
Family updated as to patient's status.

## 2022-10-19 DIAGNOSIS — G4733 Obstructive sleep apnea (adult) (pediatric): Secondary | ICD-10-CM

## 2022-10-19 DIAGNOSIS — I629 Nontraumatic intracranial hemorrhage, unspecified: Secondary | ICD-10-CM | POA: Diagnosis not present

## 2022-10-19 DIAGNOSIS — E876 Hypokalemia: Secondary | ICD-10-CM

## 2022-10-19 HISTORY — DX: Hypokalemia: E87.6

## 2022-10-19 LAB — BASIC METABOLIC PANEL
Anion gap: 10 (ref 5–15)
BUN: 17 mg/dL (ref 8–23)
CO2: 26 mmol/L (ref 22–32)
Calcium: 9 mg/dL (ref 8.9–10.3)
Chloride: 103 mmol/L (ref 98–111)
Creatinine, Ser: 1.22 mg/dL — ABNORMAL HIGH (ref 0.44–1.00)
GFR, Estimated: 45 mL/min — ABNORMAL LOW (ref >60)
Glucose, Bld: 148 mg/dL — ABNORMAL HIGH (ref 70–99)
Potassium: 4.1 mmol/L (ref 3.5–5.1)
Sodium: 139 mmol/L (ref 135–145)

## 2022-10-19 LAB — CBG MONITORING, ED: Glucose-Capillary: 149 mg/dL — ABNORMAL HIGH (ref 70–99)

## 2022-10-19 LAB — CBC
HCT: 37.6 % (ref 36.0–46.0)
Hemoglobin: 12.8 g/dL (ref 12.0–15.0)
MCH: 30.8 pg (ref 26.0–34.0)
MCHC: 34 g/dL (ref 30.0–36.0)
MCV: 90.4 fL (ref 80.0–100.0)
Platelets: 199 10*3/uL (ref 150–400)
RBC: 4.16 MIL/uL (ref 3.87–5.11)
RDW: 13.4 % (ref 11.5–15.5)
WBC: 8.8 10*3/uL (ref 4.0–10.5)
nRBC: 0 % (ref 0.0–0.2)

## 2022-10-19 MED ORDER — STROKE: EARLY STAGES OF RECOVERY BOOK: Freq: Once | Status: AC

## 2022-10-19 MED ORDER — ONDANSETRON HCL 4 MG/2ML IJ SOLN: 4.0000 mg | Freq: Four times a day (QID) | INTRAMUSCULAR | Status: DC | PRN

## 2022-10-19 MED ORDER — MONTELUKAST SODIUM 10 MG PO TABS
10.0000 mg | ORAL_TABLET | Freq: Every day | ORAL | Status: DC
  Administered 2022-10-19 – 2022-10-20 (×2): 10 mg via ORAL
  Filled 2022-10-19 (×2): qty 1

## 2022-10-19 MED ORDER — CHLORTHALIDONE 25 MG PO TABS
12.5000 mg | ORAL_TABLET | Freq: Every day | ORAL | Status: DC
  Administered 2022-10-19 – 2022-10-20 (×2): 12.5 mg via ORAL
  Filled 2022-10-19: qty 0.5
  Filled 2022-10-19: qty 1

## 2022-10-19 MED ORDER — HYDRALAZINE HCL 20 MG/ML IJ SOLN
10.0000 mg | Freq: Four times a day (QID) | INTRAMUSCULAR | Status: DC | PRN
  Filled 2022-10-19: qty 1

## 2022-10-19 MED ORDER — HYDRALAZINE HCL 20 MG/ML IJ SOLN
5.0000 mg | Freq: Four times a day (QID) | INTRAMUSCULAR | Status: DC | PRN
  Administered 2022-10-19: 5 mg via INTRAVENOUS

## 2022-10-19 MED ORDER — ROSUVASTATIN CALCIUM 20 MG PO TABS
20.0000 mg | ORAL_TABLET | Freq: Every day | ORAL | Status: DC
  Administered 2022-10-19 – 2022-10-20 (×2): 20 mg via ORAL
  Filled 2022-10-19 (×2): qty 1

## 2022-10-19 MED ORDER — ACETAMINOPHEN 650 MG RE SUPP: 650.0000 mg | Freq: Four times a day (QID) | RECTAL | Status: DC | PRN

## 2022-10-19 MED ORDER — SENNOSIDES-DOCUSATE SODIUM 8.6-50 MG PO TABS: 1.0000 | ORAL_TABLET | Freq: Every evening | ORAL | Status: DC | PRN

## 2022-10-19 MED ORDER — ACETAMINOPHEN 325 MG PO TABS: 650.0000 mg | ORAL_TABLET | Freq: Four times a day (QID) | ORAL | Status: DC | PRN

## 2022-10-19 MED ORDER — ATENOLOL 25 MG PO TABS
25.0000 mg | ORAL_TABLET | Freq: Every day | ORAL | Status: DC
  Administered 2022-10-19 – 2022-10-20 (×2): 25 mg via ORAL
  Filled 2022-10-19 (×2): qty 1

## 2022-10-19 MED ORDER — IRBESARTAN 150 MG PO TABS
75.0000 mg | ORAL_TABLET | Freq: Every day | ORAL | Status: DC
  Administered 2022-10-19 – 2022-10-20 (×2): 75 mg via ORAL
  Filled 2022-10-19 (×2): qty 1

## 2022-10-19 MED ORDER — AMLODIPINE BESYLATE 5 MG PO TABS
5.0000 mg | ORAL_TABLET | Freq: Every day | ORAL | Status: DC
  Administered 2022-10-19 – 2022-10-20 (×2): 5 mg via ORAL
  Filled 2022-10-19 (×2): qty 1

## 2022-10-19 MED ORDER — POTASSIUM CHLORIDE 20 MEQ PO PACK
40.0000 meq | PACK | Freq: Once | ORAL | Status: AC
  Administered 2022-10-19: 40 meq via ORAL
  Filled 2022-10-19: qty 2

## 2022-10-19 MED ORDER — SODIUM CHLORIDE 0.9% FLUSH
3.0000 mL | Freq: Two times a day (BID) | INTRAVENOUS | Status: DC
  Administered 2022-10-19 – 2022-10-20 (×4): 3 mL via INTRAVENOUS

## 2022-10-19 MED ORDER — ONDANSETRON HCL 4 MG PO TABS: 4.0000 mg | ORAL_TABLET | Freq: Four times a day (QID) | ORAL | Status: DC | PRN

## 2022-10-19 MED ORDER — MECLIZINE HCL 25 MG PO TABS
25.0000 mg | ORAL_TABLET | Freq: Once | ORAL | Status: AC
  Administered 2022-10-19: 25 mg via ORAL
  Filled 2022-10-19: qty 1

## 2022-10-19 NOTE — Plan of Care (Signed)
  Problem: Education: Goal: Knowledge of General Education information will improve Description: Including pain rating scale, medication(s)/side effects and non-pharmacologic comfort measures Outcome: Progressing   Problem: Health Behavior/Discharge Planning: Goal: Ability to manage health-related needs will improve Outcome: Progressing   Problem: Clinical Measurements: Goal: Ability to maintain clinical measurements within normal limits will improve Outcome: Progressing Goal: Will remain free from infection Outcome: Progressing Goal: Diagnostic test results will improve Outcome: Progressing Goal: Respiratory complications will improve Outcome: Progressing Goal: Cardiovascular complication will be avoided Outcome: Progressing   Problem: Activity: Goal: Risk for activity intolerance will decrease Outcome: Progressing   Problem: Nutrition: Goal: Adequate nutrition will be maintained Outcome: Progressing   Problem: Coping: Goal: Level of anxiety will decrease Outcome: Progressing   Problem: Elimination: Goal: Will not experience complications related to bowel motility Outcome: Progressing Goal: Will not experience complications related to urinary retention Outcome: Progressing   Problem: Pain Managment: Goal: General experience of comfort will improve Outcome: Progressing   Problem: Safety: Goal: Ability to remain free from injury will improve Outcome: Progressing   Problem: Skin Integrity: Goal: Risk for impaired skin integrity will decrease Outcome: Progressing   Problem: Education: Goal: Knowledge of disease or condition will improve Outcome: Progressing Goal: Knowledge of secondary prevention will improve (MUST DOCUMENT ALL) Outcome: Progressing Goal: Knowledge of patient specific risk factors will improve [place] N/A or DELETE if not current risk factor) Outcome: Progressing   Problem: Intracerebral Hemorrhage Tissue Perfusion: Goal: Complications of  Intracerebral Hemorrhage will be minimized Outcome: Progressing   Problem: Coping: Goal: Will verbalize positive feelings about self Outcome: Progressing Goal: Will identify appropriate support needs Outcome: Progressing   Problem: Health Behavior/Discharge Planning: Goal: Ability to manage health-related needs will improve Outcome: Progressing Goal: Goals will be collaboratively established with patient/family Outcome: Progressing   Problem: Self-Care: Goal: Ability to participate in self-care as condition permits will improve Outcome: Progressing Goal: Verbalization of feelings and concerns over difficulty with self-care will improve Outcome: Progressing Goal: Ability to communicate needs accurately will improve Outcome: Progressing   Problem: Nutrition: Goal: Risk of aspiration will decrease Outcome: Progressing Goal: Dietary intake will improve Outcome: Progressing

## 2022-10-19 NOTE — Assessment & Plan Note (Signed)
Oral supplement ordered. 

## 2022-10-19 NOTE — Assessment & Plan Note (Addendum)
MRI brain shows early subacute hemorrhage in the right occipital lobe, involving approximately 1 mm.  Multiple foci of hemosiderin deposition also noted, could be sequela of hypertensive microhemorrhages, early cerebral amyloid angiopathy, or multiple cavernoma's.  Cleared from neurosurgery standpoint per EDP conversation.  Neurology recommended observation overnight for BP control. -Resume home amlodipine, atenolol-chlorthalidone, valsartan -IV hydralazine if needed for SBP >160 -Started on gabapentin for headache per neurology -Hold aspirin for now

## 2022-10-19 NOTE — ED Notes (Signed)
Pt walked to the bathroom with little assistance.

## 2022-10-19 NOTE — Assessment & Plan Note (Signed)
Initial serum glucose was 43.  CBGs improved with oral treatment. -Holding glipizide

## 2022-10-19 NOTE — ED Notes (Signed)
ED TO INPATIENT HANDOFF REPORT  ED Nurse Name and Phone #: [phone]  S Name/Age/Gender [name] 81 y.o. female Room/Bed: 046C/046C  Code Status   Code Status: Full Code  Home/SNF/Other Home Patient oriented to: self, place, time, and situation Is this baseline? Yes   Triage Complete: Triage complete  Chief Complaint Subacute intracranial hemorrhage [facility]) [I62.9]  Triage Note Patient here POV from Home.  Endorses Right Sided Head Pain that began Friday. Seen then and discharged. States Pain has continued and went to another ED 2 Days ago but LWBS due to Wait. Seeks Evaluation today for continued Pain. States Pain is Intermittent and usually occurs in intervals of 10 Seconds.   No Neurological Deficits noted during Triage.   NAD Noted during Triage. A&Ox4. GCS 15. Ambulatory.  Patient BIB EMS from [facility] for MRI for ongoing headache. Ambulatory., VSS   Allergies Allergies  Allergen Reactions   Bee Venom Hives   Codeine Other (See Comments)    Hallucinations    Hydrocodone     hallucinations   Latex Hives and Itching   Oxycodone Other (See Comments)    hallucinations    Level of Care/Admitting Diagnosis ED Disposition     ED Disposition  Admit   Condition  --   Comment  Hospital Area: [facility] [[id number]]  Level of Care: Telemetry Medical [104]  May place patient in observation at [facility] or [facility] if equivalent level of care is available:: No  Covid Evaluation: Asymptomatic - no recent exposure (last 10 days) testing not required  Diagnosis: Subacute intracranial hemorrhage [facility]) [[id number]]  Admitting Physician: Lenore Cordia [[id number]]  Attending Physician: Lenore Cordia [[id number]]          B Medical/Surgery History Past Medical History:  Diagnosis Date   Abdominal pain    Arthritis    cervical disc degeneration/ oa left knee, carpal tunnel rt wrist, adhesive capsulitis right shoulder, rt hand weakness; lumbar  degeneration   Carpal tunnel syndrome    Complication of anesthesia 2006-at [facility]   breathing problems-no BP med given prior to surgery;  hx of being very sleepy after colon surgery --  states no problems with last right total knee replacement 2013   Constipation    Diabetes mellitus without complication ([place])    borderline - diet control   Diverticulitis hx of   Diverticulosis    E. coli infection    2020   Frequent UTI    hx of urethral injury during colon surgery - states frequent uti's since   GERD (gastroesophageal reflux disease)    H/O hiatal hernia    History of palpitations    in the past   History of shingles    has a lingering itching on back where shingles were   Hyperlipidemia    Hypertension    Numbness and tingling in right hand    pt. states has numbness of right hand very frequently-watch positioning   Obesity    Osteoarthritis    Osteoporosis    Pain    pain left knee and pain right hip and right groin   Personal history of colonic polyps-adenoma 08/26/2008   Pneumonia 2005   Shortness of breath 09/14/2021   Vitamin D deficiency    Past Surgical History:  Procedure Laterality Date   ABDOMINAL HYSTERECTOMY     bladder tack     BREAST BIOPSY Left    BREAST SURGERY     breast duct resection- benign  CARPAL TUNNEL RELEASE Right 06/04/2014   Procedure: RIGHT CARPAL TUNNEL RELEASE;  Surgeon: Roseanne Kaufman, MD;  Location: [place];  Service: Orthopedics;  Laterality: Right;   COLON RESECTION  2008   hx diverticulosis   COLON SURGERY     COLONOSCOPY     colonoscopy [id number]     JOINT REPLACEMENT     OOPHORECTOMY     POLYPECTOMY     skin graft left arm - traumatic compression injury left upper arm     temporary ureter stent     TOTAL KNEE ARTHROPLASTY  01/05/2012   Procedure: TOTAL KNEE ARTHROPLASTY;  Surgeon: [name], MD;  Location: [facility];  Service: Orthopedics;  Laterality: Right;   TOTAL KNEE ARTHROPLASTY Left  08/17/2014   Procedure: LEFT TOTAL KNEE ARTHROPLASTY;  Surgeon: [name], MD;  Location: [facility];  Service: Orthopedics;  Laterality: Left;   ureter repair for tyransected left ureter       A IV Location/Drains/Wounds Patient Lines/Drains/Airways Status     Active Line/Drains/Airways     Name Placement date Placement time Site Days   Peripheral IV 10/18/22 18 G Right Antecubital 10/18/22  1547  Antecubital  1            Intake/Output Last 24 hours No intake or output data in the 24 hours ending 10/19/22 1722  Labs/Imaging Results for orders placed or performed during the hospital encounter of 10/18/22 (from the past 48 hour(s))  Urinalysis, Routine w reflex microscopic Urine, Clean Catch     Status: Abnormal   Collection Time: 10/18/22  3:35 PM  Result Value Ref Range   Color, Urine COLORLESS (A) YELLOW   APPearance CLEAR CLEAR   Specific Gravity, Urine 1.006 1.005 - 1.030   pH 6.5 5.0 - 8.0   Glucose, UA NEGATIVE NEGATIVE mg/dL   Hgb urine dipstick NEGATIVE NEGATIVE   Bilirubin Urine NEGATIVE NEGATIVE   Ketones, ur NEGATIVE NEGATIVE mg/dL   Protein, ur NEGATIVE NEGATIVE mg/dL   Nitrite NEGATIVE NEGATIVE   Leukocytes,Ua NEGATIVE NEGATIVE    Comment: Performed at [facility], [place], [place] [address]  Ethanol     Status: Abnormal   Collection Time: 10/18/22  3:47 PM  Result Value Ref Range   Alcohol, Ethyl (B) 10 (H) <10 mg/dL    Comment: (NOTE) Lowest detectable limit for serum alcohol is 10 mg/dL.  For medical purposes only. Performed at [facility], [address], [place], [place] [address]   Protime-INR     Status: None   Collection Time: 10/18/22  3:47 PM  Result Value Ref Range   Prothrombin Time 12.9 11.4 - 15.2 seconds   INR 1.0 0.8 - 1.2    Comment: (NOTE) INR goal varies based on device and disease states. Performed at [facility], [address] [place], [place] [address]   APTT     Status: None   Collection Time: 10/18/22  3:47 PM  Result Value Ref Range   aPTT 31 24 - 36 seconds    Comment: Performed at [facility], [address] [place], [place] [address]  CBC with Differential     Status: Abnormal   Collection Time: 10/18/22  3:47 PM  Result Value Ref Range   WBC 10.7 (H) 4.0 - 10.5 K/uL   RBC 5.00 3.87 - 5.11 MIL/uL   Hemoglobin 15.3 (H) 12.0 - 15.0 g/dL   HCT 45.4 36.0 - 46.0 %  MCV 90.8 80.0 - 100.0 fL   MCH 30.6 26.0 - 34.0 pg   MCHC 33.7 30.0 - 36.0 g/dL   RDW 13.4 11.5 - 15.5 %   Platelets 242 150 - 400 K/uL   nRBC 0.0 0.0 - 0.2 %   Neutrophils Relative % 59 %   Neutro Abs 6.4 1.7 - 7.7 K/uL   Lymphocytes Relative 28 %   Lymphs Abs 2.9 0.7 - 4.0 K/uL   Monocytes Relative 9 %   Monocytes Absolute 0.9 0.1 - 1.0 K/uL   Eosinophils Relative 3 %   Eosinophils Absolute 0.3 0.0 - 0.5 K/uL   Basophils Relative 1 %   Basophils Absolute 0.1 0.0 - 0.1 K/uL   Immature Granulocytes 0 %   Abs Immature Granulocytes 0.04 0.00 - 0.07 K/uL    Comment: Performed at [facility], [address], [place], [place] [address]  Comprehensive metabolic panel     Status: Abnormal   Collection Time: 10/18/22  3:47 PM  Result Value Ref Range   Sodium 142 135 - 145 mmol/L   Potassium 3.3 (L) 3.5 - 5.1 mmol/L   Chloride 102 98 - 111 mmol/L   CO2 29 22 - 32 mmol/L   Glucose, Bld 43 (LL) 70 - 99 mg/dL    Comment: Glucose reference range applies only to samples taken after fasting for at least 8 hours. CRITICAL RESULT CALLED TO, READ BACK BY AND VERIFIED WITH: Ritta Slot 1617 10/18/2022 DBRADLEY    BUN 21 8 - 23 mg/dL   Creatinine, Ser 1.14 (H) 0.44 - 1.00 mg/dL   Calcium 9.9 8.9 - 10.3 mg/dL   Total Protein 7.7 6.5 - 8.1 g/dL   Albumin 4.5 3.5 - 5.0 g/dL   AST 28 15 - 41 U/L   ALT 23 0 - 44 U/L   Alkaline Phosphatase 42 38 - 126 U/L   Total Bilirubin 1.1 0.3 - 1.2 mg/dL   GFR,  Estimated 49 (L) >60 mL/min    Comment: (NOTE) Calculated using the CKD-EPI Creatinine Equation (2021)    Anion gap 11 5 - 15    Comment: Performed at [facility], [place], [place] [address]  Troponin I (High Sensitivity)     Status: None   Collection Time: 10/18/22  3:47 PM  Result Value Ref Range   Troponin I (High Sensitivity) 7 <18 ng/L    Comment: (NOTE) Elevated high sensitivity troponin I (hsTnI) values and significant  changes across serial measurements may suggest ACS but many other  chronic and acute conditions are known to elevate hsTnI results.  Refer to the "Links" section for chest pain algorithms and additional  guidance. Performed at [facility], [address] [place], [place] [address]   CBG monitoring, ED     Status: None   Collection Time: 10/18/22  5:45 PM  Result Value Ref Range   Glucose-Capillary 86 70 - 99 mg/dL    Comment: Glucose reference range applies only to samples taken after fasting for at least 8 hours.  CBG monitoring, ED     Status: Abnormal   Collection Time: 10/18/22  7:56 PM  Result Value Ref Range   Glucose-Capillary 114 (H) 70 - 99 mg/dL    Comment: Glucose reference range applies only to samples taken after fasting for at least 8 hours.  POC CBG, ED     Status: None   Collection Time: 10/18/22 10:18 PM  Result Value Ref Range   Glucose-Capillary  92 70 - 99 mg/dL    Comment: Glucose reference range applies only to samples taken after fasting for at least 8 hours.   Comment 1 Notify RN   CBC     Status: None   Collection Time: 10/19/22  4:23 AM  Result Value Ref Range   WBC 8.8 4.0 - 10.5 K/uL   RBC 4.16 3.87 - 5.11 MIL/uL   Hemoglobin 12.8 12.0 - 15.0 g/dL   HCT 37.6 36.0 - 46.0 %   MCV 90.4 80.0 - 100.0 fL   MCH 30.8 26.0 - 34.0 pg   MCHC 34.0 30.0 - 36.0 g/dL   RDW 13.4 11.5 - 15.5 %   Platelets 199 150 - 400 K/uL   nRBC 0.0 0.0 - 0.2 %    Comment: Performed at [facility], [place] [address], [place], [place] [address]  Basic metabolic panel     Status: Abnormal   Collection Time: 10/19/22  4:23 AM  Result Value Ref Range   Sodium 139 135 - 145 mmol/L   Potassium 4.1 3.5 - 5.1 mmol/L   Chloride 103 98 - 111 mmol/L   CO2 26 22 - 32 mmol/L   Glucose, Bld 148 (H) 70 - 99 mg/dL    Comment: Glucose reference range applies only to samples taken after fasting for at least 8 hours.   BUN 17 8 - 23 mg/dL   Creatinine, Ser 1.22 (H) 0.44 - 1.00 mg/dL   Calcium 9.0 8.9 - 10.3 mg/dL   GFR, Estimated 45 (L) >60 mL/min    Comment: (NOTE) Calculated using the CKD-EPI Creatinine Equation (2021)    Anion gap 10 5 - 15    Comment: Performed at [facility] [address] [place], [place] [address]  CBG monitoring, ED     Status: Abnormal   Collection Time: 10/19/22  4:28 PM  Result Value Ref Range   Glucose-Capillary 149 (H) 70 - 99 mg/dL    Comment: Glucose reference range applies only to samples taken after fasting for at least 8 hours.   MR Brain W and Wo Contrast  Result Date: 10/18/2022 CLINICAL DATA:  Headache EXAM: MRI HEAD WITHOUT AND WITH CONTRAST TECHNIQUE: Multiplanar, multiecho pulse sequences of the brain and surrounding structures were obtained without and with intravenous contrast. CONTRAST:  25m GADAVIST GADOBUTROL 1 MMOL/ML IV SOLN COMPARISON:  04/17/2022 MRI head, correlation is also made with 10/18/2022 CT head FINDINGS: Brain: T1 hyperintense, T2 hypointense signal in the right occipital lobe, consistent with early subacute hemorrhage (2-7 days), which measures 9 x 17 x 12 mm (AP x TR x CC) (series 8, image 21 and series 4, image 11), with a volume of approximately 1 mL. Surrounding T2 hyperintense signal, likely edema without significant mass effect or midline shift. No restricted diffusion to suggest acute or subacute infarct. No abnormal parenchymal or meningeal enhancement. No evidence of contrast enhancement within the hemorrhage to  suggest active extravasation. No acute mass, mass effect, or midline shift. No hydrocephalus or extra-axial collection. Low lying cerebellar tonsils, which extend 1-2 mm below the foramen magnum and do not meet criteria for Chiari I malformation. Scattered and confluent T2 hyperintense signal in the periventricular white matter, likely the sequela of moderate chronic small vessel ischemic disease. Multiple foci of hemosiderin deposition, most prominently in the parietal and occipital lobes; with the exception of the new subacute hemorrhage, the other foci appear unchanged compared to 04/17/2022. Vascular: Normal arterial flow voids. Normal arterial  and venous enhancement. Skull and upper cervical spine: Normal marrow signal. Sinuses/Orbits: Clear paranasal sinuses. Status post bilateral lens replacements. Other: The mastoids are well aerated. IMPRESSION: 1. Early subacute hemorrhage in the right occipital lobe, with a volume of approximately 1 mL. Surrounding edema without significant mass effect or midline shift. No evidence of active hemorrhage. 2. Multiple foci of hemosiderin deposition, most prominently in the parietal and occipital lobes; with the exception of the new subacute hemorrhage, the others appear unchanged compared to 04/17/2022. While these could be the sequela of hypertensive microhemorrhages, early cerebral amyloid angiopathy or multiple cavernomas could appear similar. Electronically Signed   By: Merilyn Baba M.D.   On: 10/18/2022 22:26   CT ANGIO HEAD W OR WO CONTRAST  Result Date: 10/18/2022 CLINICAL DATA:  Headache; rule out dural venous sinus thrombosis, AVM EXAM: CT ANGIOGRAPHY HEAD CT VENOGRAM TECHNIQUE: Multidetector CT imaging of the head was performed using the standard protocol during bolus administration of intravenous contrast. Multiplanar CT image reconstructions and MIPs were obtained to evaluate the vascular anatomy. Venographic phase images of the brain were obtained  following the administration of intravenous contrast. Multiplanar reformats and maximum intensity projections were generated. RADIATION DOSE REDUCTION: This exam was performed according to the departmental dose-optimization program which includes automated exposure control, adjustment of the mA and/or kV according to patient size and/or use of iterative reconstruction technique. CONTRAST:  80m OMNIPAQUE IOHEXOL 350 MG/ML SOLN COMPARISON:  04/18/2022 CTA head and neck, correlation is also made with 10/18/2022 CT head FINDINGS: CT HEAD FINDINGS For noncontrast findings, please see same day CT head. CTA HEAD FINDINGS Anterior circulation: Both internal carotid arteries are patent to the termini, without moderate stenosis in the bilateral cavernous segments and mild stenosis in the bilateral supraclinoid segments. A1 segments patent. Normal anterior communicating artery. Anterior cerebral arteries are patent to their distal aspects. No M1 stenosis or occlusion. MCA branches perfused and symmetric. Posterior circulation: Vertebral arteries patent to the vertebrobasilar junction with moderate stenosis in the right mid V4 posterior inferior cerebellar arteries patent proximally. Basilar patent to its distal aspect. Superior cerebellar arteries patent proximally. Patent P1 segments. PCAs perfused to their distal aspects without stenosis. The right posterior communicating artery is patent. Anatomic variants: None significant. Review of the MIP images confirms the above findings CT VENOGRAM Superior sagittal sinus: Normal. Straight sinus: Normal. Inferior sagittal sinus, vein of Galen and internal cerebral veins: Normal. Transverse sinuses: Normal on the left. Hypoplastic right transverse sinus, which appears similar to the 04/18/2022 CTA head. Small filling defects within the right transverse sinus are most likely arachnoid granulation and appear unchanged from the prior exam. Sigmoid sinuses: Normal on the left.  Hypoplastic on the right, unchanged from 04/18/2022. Visualized jugular veins: Normal IMPRESSION: 1. No intracranial large vessel occlusion. No evidence of vascular malformation. 2. 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 3. Moderate stenosis in the right mid V4 segment. 4. No evidence of venous sinus thrombosis or stenosis. The right transverse and sigmoid sinus are hypoplastic, which appears similar to the 04/18/2022 CTA head. Electronically Signed   By: AMerilyn BabaM.D.   On: 10/18/2022 19:55   CT VENOGRAM HEAD  Result Date: 10/18/2022 CLINICAL DATA:  Headache; rule out dural venous sinus thrombosis, AVM EXAM: CT ANGIOGRAPHY HEAD CT VENOGRAM TECHNIQUE: Multidetector CT imaging of the head was performed using the standard protocol during bolus administration of intravenous contrast. Multiplanar CT image reconstructions and MIPs were obtained  to evaluate the vascular anatomy. Venographic phase images of the brain were obtained following the administration of intravenous contrast. Multiplanar reformats and maximum intensity projections were generated. RADIATION DOSE REDUCTION: This exam was performed according to the departmental dose-optimization program which includes automated exposure control, adjustment of the mA and/or kV according to patient size and/or use of iterative reconstruction technique. CONTRAST:  79m OMNIPAQUE IOHEXOL 350 MG/ML SOLN COMPARISON:  04/18/2022 CTA head and neck, correlation is also made with 10/18/2022 CT head FINDINGS: CT HEAD FINDINGS For noncontrast findings, please see same day CT head. CTA HEAD FINDINGS Anterior circulation: Both internal carotid arteries are patent to the termini, without moderate stenosis in the bilateral cavernous segments and mild stenosis in the bilateral supraclinoid segments. A1 segments patent. Normal anterior communicating artery. Anterior cerebral arteries are patent to their  distal aspects. No M1 stenosis or occlusion. MCA branches perfused and symmetric. Posterior circulation: Vertebral arteries patent to the vertebrobasilar junction with moderate stenosis in the right mid V4 posterior inferior cerebellar arteries patent proximally. Basilar patent to its distal aspect. Superior cerebellar arteries patent proximally. Patent P1 segments. PCAs perfused to their distal aspects without stenosis. The right posterior communicating artery is patent. Anatomic variants: None significant. Review of the MIP images confirms the above findings CT VENOGRAM Superior sagittal sinus: Normal. Straight sinus: Normal. Inferior sagittal sinus, vein of Galen and internal cerebral veins: Normal. Transverse sinuses: Normal on the left. Hypoplastic right transverse sinus, which appears similar to the 04/18/2022 CTA head. Small filling defects within the right transverse sinus are most likely arachnoid granulation and appear unchanged from the prior exam. Sigmoid sinuses: Normal on the left. Hypoplastic on the right, unchanged from 04/18/2022. Visualized jugular veins: Normal IMPRESSION: 1. No intracranial large vessel occlusion. No evidence of vascular malformation. 2. 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 3. Moderate stenosis in the right mid V4 segment. 4. No evidence of venous sinus thrombosis or stenosis. The right transverse and sigmoid sinus are hypoplastic, which appears similar to the 04/18/2022 CTA head. Electronically Signed   By: AMerilyn BabaM.D.   On: 10/18/2022 19:55   CT Head Wo Contrast  Result Date: 10/18/2022 CLINICAL DATA:  Provided history: Headache, new onset. Right-sided head pain which began on Friday. EXAM: CT HEAD WITHOUT CONTRAST TECHNIQUE: Contiguous axial images were obtained from the base of the skull through the vertex without intravenous contrast. RADIATION DOSE REDUCTION: This exam was performed  according to the departmental dose-optimization program which includes automated exposure control, adjustment of the mA and/or kV according to patient size and/or use of iterative reconstruction technique. COMPARISON:  CT angiogram head/neck 04/18/2022. Brain MRI 04/17/2022. Head CT 04/17/2022. FINDINGS: Brain: No age advanced or lobar predominant parenchymal atrophy. 1.3 cm hyperdense focus within the right occipital lobe (for instance as seen on series 2, image 14) (series 4, image 9) (series 5, image 22). Moderate patchy and ill-defined hypoattenuation within the cerebral white matter, nonspecific but compatible with chronic small vessel disease. Partially empty sella turcica. No demarcated cortical infarct. No extra-axial fluid collection. No midline shift. Vascular: No hyperdense vessel.  Atherosclerotic calcifications. Skull: No fracture or aggressive osseous lesion. Sinuses/Orbits: No mass or acute finding within the imaged orbits. No significant paranasal sinus disease at the imaged levels. These results were called by telephone at the time of interpretation on 10/18/2022 at 2:40 pm [facility] verbally acknowledged these results. IMPRESSION: 1.3 cm hyperdense focus within the  right occipital lobe. This may reflect an acute parenchymal hemorrhage. However, a brain MRI without and with contrast is recommended to exclude a primary mass or metastatic lesion. Moderate chronic small ischemic changes within the cerebral white matter. Electronically Signed   By: Kellie Simmering D.O.   On: 10/18/2022 14:46    Pending Labs Unresulted Labs (From admission, onward)    None       Vitals/Pain Today's Vitals   10/19/22 0845 10/19/22 1230 10/19/22 1330 10/19/22 1630  BP:  (!) 150/66 132/87 (!) 168/96  Pulse:  (!) 57 (!) 56 (!) 59  Resp: 17 17 (!) 22 17  Temp:   (!) 97.5 F (36.4 C)   TempSrc:   Oral   SpO2: 100% 98% 100% 98%  Weight:      Height:      PainSc:        Isolation  Precautions No active isolations  Medications Medications  gabapentin (NEURONTIN) capsule 300 mg (300 mg Oral Given 10/19/22 1518)  sodium chloride flush (NS) 0.9 % injection 3 mL (3 mLs Intravenous Given 10/19/22 0909)  acetaminophen (TYLENOL) tablet 650 mg (has no administration in time range)    Or  acetaminophen (TYLENOL) suppository 650 mg (has no administration in time range)  ondansetron (ZOFRAN) tablet 4 mg (has no administration in time range)    Or  ondansetron (ZOFRAN) injection 4 mg (has no administration in time range)  senna-docusate (Senokot-S) tablet 1 tablet (has no administration in time range)  amLODipine (NORVASC) tablet 5 mg (5 mg Oral Given 10/19/22 0829)  atenolol (TENORMIN) tablet 25 mg (25 mg Oral Given 10/19/22 0829)  montelukast (SINGULAIR) tablet 10 mg (10 mg Oral Given 10/19/22 0829)  rosuvastatin (CRESTOR) tablet 20 mg (20 mg Oral Given 10/19/22 0829)  irbesartan (AVAPRO) tablet 75 mg (75 mg Oral Given 10/19/22 0829)  chlorthalidone (HYGROTON) tablet 12.5 mg (12.5 mg Oral Given 10/19/22 0829)  hydrALAZINE (APRESOLINE) injection 5 mg (5 mg Intravenous Given 10/19/22 1649)  iohexol (OMNIPAQUE) 350 MG/ML injection 100 mL (80 mLs Intravenous Contrast Given 10/18/22 1816)  gadobutrol (GADAVIST) 1 MMOL/ML injection 10 mL (10 mLs Intravenous Contrast Given 10/18/22 2127)  acetaminophen (TYLENOL) tablet 1,000 mg (1,000 mg Oral Given 10/18/22 2349)  hydrALAZINE (APRESOLINE) tablet 10 mg (10 mg Oral Given 10/18/22 2349)  traMADol (ULTRAM) tablet 50 mg (50 mg Oral Given 10/18/22 2349)  potassium chloride (KLOR-CON) packet 40 mEq (40 mEq Oral Given 10/19/22 0106)   stroke: early stages of recovery book ( Does not apply Given 10/19/22 1409)  meclizine (ANTIVERT) tablet 25 mg (25 mg Oral Given 10/19/22 1518)    Mobility walks Low fall risk   Focused Assessments Neuro Assessment Handoff:  Swallow screen pass? Yes          Neuro Assessment: Within Defined  Limits Neuro Checks:      Last Documented NIHSS Modified Score:   Has TPA been given? No If patient is a Neuro Trauma and patient is going to OR before floor call report to [place] nurse: [phone] or [phone]   R Recommendations: See Admitting Provider Note  Report given to:   Additional Notes:

## 2022-10-19 NOTE — Progress Notes (Signed)
PROGRESS NOTE  [name] ALP:[id number] DOB: 1941/08/20 DOA: 10/18/2022 PCP: Tilda Franco, FNP   LOS: 0 days   Brief Narrative / Interim history: 81 year old female with DM2, HTN, HLD, CKD 3A, history of TIA on aspirin, OSA on CPAP comes in with persistent headache.  She had an episode about 6 days ago where her blood pressure was in the 353G systolic, and she reports that several of his home medications were changed at that time.  She was found to have early subacute hemorrhage in the right occipital load with surrounding edema, no mass effect or midline shift.  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  It also showed foci of hemosiderin deposition likely sequela of hypertensive microhemorrhages, early cerebral amyloid angiopathy or multiple cavernoma's.  Neurology consulted and following.  Blood pressure is of utmost importance, goal is less than 992 systolic.  Discussed with Dr. Leonie Man today, continue to monitor, repeat CT scan tomorrow morning and if clear she could potentially go home  Active problems Essential hypertension-continue amlodipine, atenolol, irbesartan.  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  She would benefit from weight loss.  Scheduled Meds:    stroke: early stages of recovery book   Does not apply Once   amLODipine  5 mg Oral Daily   atenolol  25 mg Oral Daily   chlorthalidone  12.5 mg Oral Daily   gabapentin  300 mg Oral TID   irbesartan  75 mg Oral Daily   montelukast  10 mg Oral Daily   rosuvastatin  20 mg Oral Daily   sodium chloride flush  3 mL Intravenous Q12H   Continuous Infusions: PRN Meds:.acetaminophen **OR** acetaminophen, hydrALAZINE, ondansetron **OR** ondansetron (ZOFRAN) IV, senna-docusate  Current Outpatient Medications  Medication Instructions   allopurinol (ZYLOPRIM) 100 mg, Oral, Daily   amLODipine (NORVASC) 5 mg, Oral, Daily   aspirin EC 81 mg, Oral, Daily   atenolol-chlorthalidone (TENORETIC) 50-25 MG tablet 1 tablet, Oral, Daily   Blood Pressure KIT 1 Units, Does not apply, Daily, Check blood pressure daily for hypertension dx code I10   estradiol (ESTRACE) 0.1 MG/GM vaginal cream Insert blueberry size amount of cream on finger (or 0.5 grams with applicator) in vagina daily x2 week then 2x per week.   glipiZIDE (GLUCOTROL) 10 mg, Oral, Daily   Lancets (ONETOUCH DELICA PLUS EQASTM19Q) MISC USE TO CHECK BLOOD SUGAR TWICE DAILY   montelukast (SINGULAIR) 10 mg, Oral, Daily   Multiple Vitamin (MULTIVITAMIN) tablet 1 tablet, Oral, Daily   ONETOUCH VERIO test strip USE TO TEST 2 TIMES DAILY   rosuvastatin (CRESTOR) 20 MG tablet TAKE 1 TABLET(20 MG) BY MOUTH DAILY   solifenacin (VESICARE) 5 mg, Oral, Every morning   Trulicity 0.5 mg, Subcutaneous, Every 14 days, Every other Friday    valsartan (DIOVAN) 80 mg, Oral, Daily  Diet Orders (From admission, onward)     Start     Ordered   10/19/22 0039  Diet Heart Room service appropriate? Yes; Fluid consistency: Thin  Diet effective now       Question Answer Comment  Room service appropriate? Yes   Fluid consistency: Thin      10/19/22 0039            DVT prophylaxis: SCDs Start: 10/19/22 0039   Lab Results  Component Value Date   PLT 199  10/19/2022      Code Status: Full Code  Family Communication: no family at bedside   Status is: Observation The patient will require care spanning > 2 midnights and should be moved to inpatient because: Neurological monitoring, repeat CT scan tomorrow morning   Level of care: Telemetry Medical  Consultants:  Neurology  Objective: Vitals:   10/19/22 0829 10/19/22 0830 10/19/22 0845 10/19/22 1230  BP: (!) 148/76 (!) 148/76  (!) 150/66  Pulse: (!) 55 (!) 55  (!) 57  Resp:  _0 Temp:  (!) 97.4 F (36.3 C)    TempSrc:      SpO2:  100% 100% 98%  Weight:      Height:       No intake or output data in the 24 hours ending 10/19/22 1302 Wt Readings from Last 3 Encounters:  10/18/22 105.7 kg  10/16/22 105.7 kg  10/14/22 105.7 kg    Examination:  Constitutional: NAD Eyes: no scleral icterus ENMT: Mucous membranes are moist.  Neck: normal, supple Respiratory: clear to auscultation bilaterally, no wheezing, no crackles. Normal respiratory effort. No accessory muscle use.  Cardiovascular: Regular rate and rhythm, no murmurs / rubs / gallops. No LE edema.  Abdomen: non distended, no tenderness. Bowel sounds positive.  Musculoskeletal: no clubbing / cyanosis.  Skin: no rashes Neurologic: non focal   Data Reviewed: I have independently reviewed following labs and imaging studies   CBC Recent Labs  Lab 10/14/22 0336 10/16/22 1350 10/18/22 1547 10/19/22 0423  WBC 10.2 8.8 10.7* 8.8  HGB 14.0 14.2 15.3* 12.8  HCT 42.0 43.1 45.4 37.6  PLT 198 204 242 199  MCV 90.1 91.9 90.8 90.4  MCH 30.0 30.3 30.6 30.8  MCHC 33.3 32.9 33.7 34.0  RDW 13.4 13.4 13.4 13.4  LYMPHSABS  --   --  2.9  --   MONOABS  --   --  0.9  --   EOSABS  --   --  0.3  --   BASOSABS  --   --  0.1  --     Recent Labs  Lab 10/14/22 0336 10/16/22 1350 10/18/22 1547 10/19/22 0423  NA 136 140 142 139  K 4.1 3.9 3.3* 4.1  CL 103 104 102 103  CO2 _1 GLUCOSE 115* 150* 43* 148*  BUN  _2 CREATININE 0.90 1.01* 1.14* 1.22*  CALCIUM 9.4 9.3 9.9 9.0  AST  --   --  28  --   ALT  --   --  23  --   ALKPHOS  --   --  42  --   BILITOT  --   --  1.1  --   ALBUMIN  --   --  4.5  --   INR  --   --  1.0  --     ------------------------------------------------------------------------------------------------------------------ No results for input(s): "CHOL", "HDL", "LDLCALC", "TRIG", "CHOLHDL", "LDLDIRECT" in the last 72 hours.  Lab Results  Component Value Date   HGBA1C 6.4 (H) 04/18/2022   ------------------------------------------------------------------------------------------------------------------ No results for input(s): "TSH", "T4TOTAL", "T3FREE", "THYROIDAB" in the last 72 hours.  Invalid input(s): "FREET3"  Cardiac Enzymes No results for input(s): "CKMB", "TROPONINI", "MYOGLOBIN" in the last 168 hours.  Invalid input(s): "CK" ------------------------------------------------------------------------------------------------------------------ No results found for: "BNP"  CBG: Recent Labs  Lab 10/18/22 1745 10/18/22 1956 10/18/22 2218  GLUCAP 86 114* 92    No results found for this or any previous visit (from the past 240 hour(s)).   Radiology Studies: MR Brain W and Wo Contrast  Result Date: 10/18/2022 CLINICAL DATA:  Headache EXAM: MRI HEAD WITHOUT AND WITH CONTRAST TECHNIQUE: Multiplanar, multiecho pulse sequences of the brain and surrounding structures were obtained without and with intravenous contrast. CONTRAST:  49m GADAVIST GADOBUTROL 1 MMOL/ML IV SOLN COMPARISON:  04/17/2022 MRI head, correlation is also made with 10/18/2022 CT head FINDINGS: Brain: T1 hyperintense, T2 hypointense signal in the right occipital lobe, consistent with early subacute hemorrhage (2-7 days), which measures 9 x 17 x 12 mm (AP x TR x CC) (series 8, image 21 and series 4, image 11), with a volume of approximately 1 mL. Surrounding T2 hyperintense signal, likely edema  without significant mass effect or midline shift. No restricted diffusion to suggest acute or subacute infarct. No abnormal parenchymal or meningeal enhancement. No evidence of contrast enhancement within the hemorrhage to suggest active extravasation. No acute mass, mass effect, or midline shift. No hydrocephalus or extra-axial collection. Low lying cerebellar tonsils, which extend 1-2 mm below the foramen magnum and do not meet criteria for Chiari I malformation. Scattered and confluent T2 hyperintense signal in the periventricular white matter, likely the sequela of moderate chronic small vessel ischemic disease. Multiple foci of hemosiderin deposition, most prominently in the parietal and occipital lobes; with the exception of the new subacute hemorrhage, the other foci appear unchanged compared to 04/17/2022. Vascular: Normal arterial flow voids. Normal arterial and venous enhancement. Skull and upper cervical spine: Normal marrow signal. Sinuses/Orbits: Clear paranasal sinuses. Status post bilateral lens replacements. Other: The mastoids are well aerated. IMPRESSION: 1. Early subacute hemorrhage in the right occipital lobe, with a volume of approximately 1 mL. Surrounding edema without significant mass effect or midline shift. No evidence of active hemorrhage. 2. Multiple foci of hemosiderin deposition, most prominently in the parietal and occipital lobes; with the exception of the new subacute hemorrhage, the others appear unchanged compared to 04/17/2022. While these could be the sequela of hypertensive microhemorrhages, early cerebral amyloid angiopathy or multiple cavernomas could appear similar. Electronically Signed   By: AMerilyn BabaM.D.   On: 10/18/2022 22:26   CT ANGIO HEAD W OR WO CONTRAST  Result Date: 10/18/2022 CLINICAL DATA:  Headache; rule out dural venous sinus thrombosis, AVM EXAM: CT ANGIOGRAPHY HEAD CT VENOGRAM TECHNIQUE: Multidetector CT imaging of the head was performed using the  standard protocol during bolus administration of intravenous contrast. Multiplanar CT image reconstructions and MIPs were obtained to evaluate the vascular anatomy. Venographic phase images of the brain were obtained following the administration of intravenous contrast. Multiplanar reformats and maximum intensity projections were generated. RADIATION DOSE REDUCTION: This exam was performed according to the departmental dose-optimization program which includes automated exposure control, adjustment of the mA and/or kV according to patient size and/or use of iterative reconstruction technique. CONTRAST:  875mOMNIPAQUE IOHEXOL 350 MG/ML SOLN COMPARISON:  04/18/2022 CTA head and neck, correlation is also made with 10/18/2022 CT head FINDINGS: CT HEAD  FINDINGS For noncontrast findings, please see same day CT head. CTA HEAD FINDINGS Anterior circulation: Both internal carotid arteries are patent to the termini, without moderate stenosis in the bilateral cavernous segments and mild stenosis in the bilateral supraclinoid segments. A1 segments patent. Normal anterior communicating artery. Anterior cerebral arteries are patent to their distal aspects. No M1 stenosis or occlusion. MCA branches perfused and symmetric. Posterior circulation: Vertebral arteries patent to the vertebrobasilar junction with moderate stenosis in the right mid V4 posterior inferior cerebellar arteries patent proximally. Basilar patent to its distal aspect. Superior cerebellar arteries patent proximally. Patent P1 segments. PCAs perfused to their distal aspects without stenosis. The right posterior communicating artery is patent. Anatomic variants: None significant. Review of the MIP images confirms the above findings CT VENOGRAM Superior sagittal sinus: Normal. Straight sinus: Normal. Inferior sagittal sinus, vein of Galen and internal cerebral veins: Normal. Transverse sinuses: Normal on the left. Hypoplastic right transverse sinus, which appears  similar to the 04/18/2022 CTA head. Small filling defects within the right transverse sinus are most likely arachnoid granulation and appear unchanged from the prior exam. Sigmoid sinuses: Normal on the left. Hypoplastic on the right, unchanged from 04/18/2022. Visualized jugular veins: Normal IMPRESSION: 1. No intracranial large vessel occlusion. No evidence of vascular malformation. 2. 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 3. Moderate stenosis in the right mid V4 segment. 4. No evidence of venous sinus thrombosis or stenosis. The right transverse and sigmoid sinus are hypoplastic, which appears similar to the 04/18/2022 CTA head. Electronically Signed   By: Merilyn Baba M.D.   On: 10/18/2022 19:55   CT VENOGRAM HEAD  Result Date: 10/18/2022 CLINICAL DATA:  Headache; rule out dural venous sinus thrombosis, AVM EXAM: CT ANGIOGRAPHY HEAD CT VENOGRAM TECHNIQUE: Multidetector CT imaging of the head was performed using the standard protocol during bolus administration of intravenous contrast. Multiplanar CT image reconstructions and MIPs were obtained to evaluate the vascular anatomy. Venographic phase images of the brain were obtained following the administration of intravenous contrast. Multiplanar reformats and maximum intensity projections were generated. RADIATION DOSE REDUCTION: This exam was performed according to the departmental dose-optimization program which includes automated exposure control, adjustment of the mA and/or kV according to patient size and/or use of iterative reconstruction technique. CONTRAST:  21m OMNIPAQUE IOHEXOL 350 MG/ML SOLN COMPARISON:  04/18/2022 CTA head and neck, correlation is also made with 10/18/2022 CT head FINDINGS: CT HEAD FINDINGS For noncontrast findings, please see same day CT head. CTA HEAD FINDINGS Anterior circulation: Both internal carotid arteries are patent to the termini, without moderate  stenosis in the bilateral cavernous segments and mild stenosis in the bilateral supraclinoid segments. A1 segments patent. Normal anterior communicating artery. Anterior cerebral arteries are patent to their distal aspects. No M1 stenosis or occlusion. MCA branches perfused and symmetric. Posterior circulation: Vertebral arteries patent to the vertebrobasilar junction with moderate stenosis in the right mid V4 posterior inferior cerebellar arteries patent proximally. Basilar patent to its distal aspect. Superior cerebellar arteries patent proximally. Patent P1 segments. PCAs perfused to their distal aspects without stenosis. The right posterior communicating artery is patent. Anatomic variants: None significant. Review of the MIP images confirms the above findings CT VENOGRAM Superior sagittal sinus: Normal. Straight sinus: Normal. Inferior sagittal sinus, vein of Galen and internal cerebral veins: Normal. Transverse sinuses: Normal on the left. Hypoplastic right transverse sinus, which appears similar to the 04/18/2022 CTA head. Small filling defects within the right transverse  sinus are most likely arachnoid granulation and appear unchanged from the prior exam. Sigmoid sinuses: Normal on the left. Hypoplastic on the right, unchanged from 04/18/2022. Visualized jugular veins: Normal IMPRESSION: 1. No intracranial large vessel occlusion. No evidence of vascular malformation. 2. 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 3. Moderate stenosis in the right mid V4 segment. 4. No evidence of venous sinus thrombosis or stenosis. The right transverse and sigmoid sinus are hypoplastic, which appears similar to the 04/18/2022 CTA head. Electronically Signed   By: Merilyn Baba M.D.   On: 10/18/2022 19:55   CT Head Wo Contrast  Result Date: 10/18/2022 CLINICAL DATA:  Provided history: Headache, new onset. Right-sided head pain which began on Friday.  EXAM: CT HEAD WITHOUT CONTRAST TECHNIQUE: Contiguous axial images were obtained from the base of the skull through the vertex without intravenous contrast. RADIATION DOSE REDUCTION: This exam was performed according to the departmental dose-optimization program which includes automated exposure control, adjustment of the mA and/or kV according to patient size and/or use of iterative reconstruction technique. COMPARISON:  CT angiogram head/neck 04/18/2022. Brain MRI 04/17/2022. Head CT 04/17/2022. FINDINGS: Brain: No age advanced or lobar predominant parenchymal atrophy. 1.3 cm hyperdense focus within the right occipital lobe (for instance as seen on series 2, image 14) (series 4, image 9) (series 5, image 22). Moderate patchy and ill-defined hypoattenuation within the cerebral white matter, nonspecific but compatible with chronic small vessel disease. Partially empty sella turcica. No demarcated cortical infarct. No extra-axial fluid collection. No midline shift. Vascular: No hyperdense vessel.  Atherosclerotic calcifications. Skull: No fracture or aggressive osseous lesion. Sinuses/Orbits: No mass or acute finding within the imaged orbits. No significant paranasal sinus disease at the imaged levels. These results were called by telephone at the time of interpretation on 10/18/2022 at 2:40 pm to provider [facility] , who verbally acknowledged these results. IMPRESSION: 1.3 cm hyperdense focus within the right occipital lobe. This may reflect an acute parenchymal hemorrhage. However, a brain MRI without and with contrast is recommended to exclude a primary mass or metastatic lesion. Moderate chronic small ischemic changes within the cerebral white matter. Electronically Signed   By: Kellie Simmering D.O.   On: 10/18/2022 14:46     [name], MD, PhD [facility]  Between 7 am - 7 pm I am available, please contact me via Amion (for emergencies) or Securechat (non urgent messages)  Between 7 pm - 7  am I am not available, please contact night coverage MD/APP via Amion

## 2022-10-19 NOTE — Consult Note (Addendum)
Neurology Consultation Reason for Consult: Intracranial hemorrhage Referring Physician: Sharlett Iles, R  CC: Headache  History is obtained from: Patient  HPI: [name] is a 81 y.o. female who began having symptoms on Friday.  She has had problems with her vision over the past few months, but noticed an abrupt change on Friday the same time she began having short stabbing headaches.  She states that initially was happening about every 10 minutes, but is spaced out at this point.  She states that it is a stabbing pain that last for a few seconds and then abates.  She is not sure if there is tearing associated with stabbing pain given that she always has some tearing that I she has he had surgery and it previously.  They took her pressure at the time and found that it was in the 200s.  She went to the emergency department on the 18th, but left before being seen.  Given that she was still having headaches today, she sought care in the freestanding ER at [place] [facility] where a CT revealed intraparenchymal hematoma in the occipital region.  Due to this a CTA/CTV were obtained which were relatively unremarkable.  She was transferred to [facility] for MRI to characterize her lesion and for further management.   LKW: Friday tpa given?: no, ICH ICH Score: 1   Past Medical History:  Diagnosis Date   Abdominal pain    Arthritis    cervical disc degeneration/ oa left knee, carpal tunnel rt wrist, adhesive capsulitis right shoulder, rt hand weakness; lumbar degeneration   Carpal tunnel syndrome    Complication of anesthesia 2006-at [facility]   breathing problems-no BP med given prior to surgery;  hx of being very sleepy after colon surgery --  states no problems with last right total knee replacement 2013   Constipation    Diabetes mellitus without complication (HCC)    borderline - diet control   Diverticulitis hx of   Diverticulosis    E. coli infection    2020   Frequent UTI    hx of urethral  injury during colon surgery - states frequent uti's since   GERD (gastroesophageal reflux disease)    H/O hiatal hernia    History of palpitations    in the past   History of shingles    has a lingering itching on back where shingles were   Hyperlipidemia    Hypertension    Numbness and tingling in right hand    pt. states has numbness of right hand very frequently-watch positioning   Obesity    Osteoarthritis    Osteoporosis    Pain    pain left knee and pain right hip and right groin   Personal history of colonic polyps-adenoma 08/26/2008   Pneumonia 2005   Shortness of breath 09/14/2021   Vitamin D deficiency      Family History  Problem Relation Age of Onset   Breast cancer Mother 37   Hypertension Mother    Prostate cancer Father    Hypertension Father    Dementia Sister    Lung cancer Brother    Hypertension Brother    COPD Brother    Cancer Maternal Grandmother    Arthritis Sister    Hypertension Sister    Arthritis Sister    Hypertension Child    Hypertension Child    Hypertension Child    Colon polyps Neg Hx    Esophageal cancer Neg Hx    Rectal cancer Neg  Hx    Stomach cancer Neg Hx    Colon cancer Neg Hx      Social History:  reports that she has never smoked. She has never used smokeless tobacco. She reports that she does not drink alcohol and does not use drugs.   Exam: Current vital signs: BP (!) 143/58   Pulse 60   Temp 98 F (36.7 C)   Resp 17   Ht '5\' 6"'$  (1.676 m)   Wt 105.7 kg   SpO2 94%   BMI 37.61 kg/m  Vital signs in last 24 hours: Temp:  [97.8 F (36.6 C)-98.4 F (36.9 C)] 98 F (36.7 C) (12/21 0109) Pulse Rate:  [51-64] 60 (12/21 0145) Resp:  [15-25] 17 (12/21 0108) BP: (117-185)/(50-78) 143/58 (12/21 0145) SpO2:  [94 %-100 %] 94 % (12/21 0145) Weight:  [105.7 kg] 105.7 kg (12/20 1217)   Physical Exam  Constitutional: Appears well-developed and well-nourished.  Psych: Affect appropriate to situation Eyes: No scleral  injection HENT: No OP obstruction MSK: no joint deformities.  Cardiovascular: Normal rate and regular rhythm.  Respiratory: Effort normal, non-labored breathing GI: Soft.  No distension. There is no tenderness.  Skin: WDI  Neuro: Mental Status: Patient is awake, alert, oriented to person, place, month, year, and situation. Patient is able to give a clear and coherent history. No signs of aphasia or neglect Cranial Nerves: II: Visual Fields are full. Pupils are equal, round, and reactive to light.   III,IV, VI: Left esotropia  V: Facial sensation is symmetric to temperature VII: Facial movement is symmetric.  VIII: hearing is intact to voice X: Uvula elevates symmetrically XI: Shoulder shrug is symmetric. XII: tongue is midline without atrophy or fasciculations.  Motor: Tone is normal. Bulk is normal. 5/5 strength was present in all four extremities.  Sensory: Sensation is symmetric to light touch and temperature in the arms and legs. Cerebellar: FNF intact bilaterally      I have reviewed labs in epic and the results pertinent to this consultation are: Glucose 43  I have reviewed the images obtained: CT head-small IPH, MRI-multiple previous cortical microhemorrhages  Impression: 81 year old female with subacute ICH, whose etiology is slightly unclear.  Hypertension is a possibility given the report of a severe hypertensive episode on that day.  Amyloid angiopathy would be another consideration given the atypical distribution of microbleed seen on her MRI.  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  With some improvement already, would favor starting gabapentin, though IV lidocaine has also demonstrated some benefit and could be considered.  Her hemorrhage is no longer acute, and therefore I do not think aggressive drips to keep blood pressure less than 150 or necessary, but BP control would be of significant  importance for secondary prevention.  Recommendations: 1) BP control with acute goal less than 160, ultimate goal of normotension 2) gabapentin 300 mg 3 times daily for pain control 3) PT, OT, ST 4) stroke team to follow   Roland Rack, MD [facility] [phone]  If 7pm- 7am, please page neurology on call as listed in [place].

## 2022-10-19 NOTE — Assessment & Plan Note (Signed)
Resume home amlodipine, atenolol-chlorthalidone, valsartan.  Goal SBP <160 per neurology as above.

## 2022-10-19 NOTE — Assessment & Plan Note (Signed)
>>  ASSESSMENT AND PLAN FOR DM2 (DIABETES MELLITUS, TYPE 2) (HCC) WRITTEN ON 10/19/2022 12:45 AM BY PATEL, VISHAL R, MD  Initial serum glucose was 43.  CBGs improved with oral treatment. -Holding glipizide

## 2022-10-19 NOTE — Assessment & Plan Note (Signed)
Stable, continue to monitor  ?

## 2022-10-19 NOTE — Progress Notes (Addendum)
STROKE TEAM PROGRESS NOTE   INTERVAL HISTORY Her family is at the bedside.  Has been ambulating independently. Maintain BP goal systolic less than 258. She tells me that Friday was the start of headaches and they persisted through Saturday and she went to the ED. They sent her home and then on Monday she was in the [facility] waiting room four about 6 hours and then left and went to Urgent care where she received some sort of shot and tylenol. Yesterday she went to [facility] ED because she was having a sharp pain in her head. Her headache has now improved. We will recommend holding aspirin for a few days. MRI scan of the brain shows a small right parieto-occipital hemorrhage etiology amyloid versus hypertensive.  CT angiogram of the brain and CT venogram are unremarkable Patient had a left hemispheric TIA in June 2023 and participated in the sleep smart study which she has just completed Vitals:   10/19/22 0644 10/19/22 0829 10/19/22 0830 10/19/22 0845  BP:  (!) 148/76 (!) 148/76   Pulse:  (!) 55 (!) 55   Resp:   18 17  Temp: 98.2 F (36.8 C)  (!) 97.4 F (36.3 C)   TempSrc: Oral     SpO2:   100% 100%  Weight:      Height:       CBC:  Recent Labs  Lab 10/18/22 1547 10/19/22 0423  WBC 10.7* 8.8  NEUTROABS 6.4  --   HGB 15.3* 12.8  HCT 45.4 37.6  MCV 90.8 90.4  PLT 242 527   Basic Metabolic Panel:  Recent Labs  Lab 10/18/22 1547 10/19/22 0423  NA 142 139  K 3.3* 4.1  CL 102 103  CO2 29 26  GLUCOSE 43* 148*  BUN 21 17  CREATININE 1.14* 1.22*  CALCIUM 9.9 9.0   Lipid Panel: No results for input(s): "CHOL", "TRIG", "HDL", "CHOLHDL", "VLDL", "LDLCALC" in the last 168 hours. HgbA1c: No results for input(s): "HGBA1C" in the last 168 hours. Urine Drug Screen: No results for input(s): "LABOPIA", "COCAINSCRNUR", "LABBENZ", "AMPHETMU", "THCU", "LABBARB" in the last 168 hours.  Alcohol Level  Recent Labs  Lab 10/18/22 1547  ETH 10*    IMAGING past 24 hours MR Brain W and Wo  Contrast  Result Date: 10/18/2022 CLINICAL DATA:  Headache EXAM: MRI HEAD WITHOUT AND WITH CONTRAST TECHNIQUE: Multiplanar, multiecho pulse sequences of the brain and surrounding structures were obtained without and with intravenous contrast. CONTRAST:  1m GADAVIST GADOBUTROL 1 MMOL/ML IV SOLN COMPARISON:  04/17/2022 MRI head, correlation is also made with 10/18/2022 CT head FINDINGS: Brain: T1 hyperintense, T2 hypointense signal in the right occipital lobe, consistent with early subacute hemorrhage (2-7 days), which measures 9 x 17 x 12 mm (AP x TR x CC) (series 8, image 21 and series 4, image 11), with a volume of approximately 1 mL. Surrounding T2 hyperintense signal, likely edema without significant mass effect or midline shift. No restricted diffusion to suggest acute or subacute infarct. No abnormal parenchymal or meningeal enhancement. No evidence of contrast enhancement within the hemorrhage to suggest active extravasation. No acute mass, mass effect, or midline shift. No hydrocephalus or extra-axial collection. Low lying cerebellar tonsils, which extend 1-2 mm below the foramen magnum and do not meet criteria for Chiari I malformation. Scattered and confluent T2 hyperintense signal in the periventricular white matter, likely the sequela of moderate chronic small vessel ischemic disease. Multiple foci of hemosiderin deposition, most prominently in the parietal and occipital lobes;  with the exception of the new subacute hemorrhage, the other foci appear unchanged compared to 04/17/2022. Vascular: Normal arterial flow voids. Normal arterial and venous enhancement. Skull and upper cervical spine: Normal marrow signal. Sinuses/Orbits: Clear paranasal sinuses. Status post bilateral lens replacements. Other: The mastoids are well aerated. IMPRESSION: 1. Early subacute hemorrhage in the right occipital lobe, with a volume of approximately 1 mL. Surrounding edema without significant mass effect or midline  shift. No evidence of active hemorrhage. 2. Multiple foci of hemosiderin deposition, most prominently in the parietal and occipital lobes; with the exception of the new subacute hemorrhage, the others appear unchanged compared to 04/17/2022. While these could be the sequela of hypertensive microhemorrhages, early cerebral amyloid angiopathy or multiple cavernomas could appear similar. Electronically Signed   By: Merilyn Baba M.D.   On: 10/18/2022 22:26   CT ANGIO HEAD W OR WO CONTRAST  Result Date: 10/18/2022 CLINICAL DATA:  Headache; rule out dural venous sinus thrombosis, AVM EXAM: CT ANGIOGRAPHY HEAD CT VENOGRAM TECHNIQUE: Multidetector CT imaging of the head was performed using the standard protocol during bolus administration of intravenous contrast. Multiplanar CT image reconstructions and MIPs were obtained to evaluate the vascular anatomy. Venographic phase images of the brain were obtained following the administration of intravenous contrast. Multiplanar reformats and maximum intensity projections were generated. RADIATION DOSE REDUCTION: This exam was performed according to the departmental dose-optimization program which includes automated exposure control, adjustment of the mA and/or kV according to patient size and/or use of iterative reconstruction technique. CONTRAST:  59m OMNIPAQUE IOHEXOL 350 MG/ML SOLN COMPARISON:  04/18/2022 CTA head and neck, correlation is also made with 10/18/2022 CT head FINDINGS: CT HEAD FINDINGS For noncontrast findings, please see same day CT head. CTA HEAD FINDINGS Anterior circulation: Both internal carotid arteries are patent to the termini, without moderate stenosis in the bilateral cavernous segments and mild stenosis in the bilateral supraclinoid segments. A1 segments patent. Normal anterior communicating artery. Anterior cerebral arteries are patent to their distal aspects. No M1 stenosis or occlusion. MCA branches perfused and symmetric. Posterior  circulation: Vertebral arteries patent to the vertebrobasilar junction with moderate stenosis in the right mid V4 posterior inferior cerebellar arteries patent proximally. Basilar patent to its distal aspect. Superior cerebellar arteries patent proximally. Patent P1 segments. PCAs perfused to their distal aspects without stenosis. The right posterior communicating artery is patent. Anatomic variants: None significant. Review of the MIP images confirms the above findings CT VENOGRAM Superior sagittal sinus: Normal. Straight sinus: Normal. Inferior sagittal sinus, vein of Galen and internal cerebral veins: Normal. Transverse sinuses: Normal on the left. Hypoplastic right transverse sinus, which appears similar to the 04/18/2022 CTA head. Small filling defects within the right transverse sinus are most likely arachnoid granulation and appear unchanged from the prior exam. Sigmoid sinuses: Normal on the left. Hypoplastic on the right, unchanged from 04/18/2022. Visualized jugular veins: Normal IMPRESSION: 1. No intracranial large vessel occlusion. No evidence of vascular malformation. 2. 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 3. Moderate stenosis in the right mid V4 segment. 4. No evidence of venous sinus thrombosis or stenosis. The right transverse and sigmoid sinus are hypoplastic, which appears similar to the 04/18/2022 CTA head. Electronically Signed   By: AMerilyn BabaM.D.   On: 10/18/2022 19:55   CT VENOGRAM HEAD  Result Date: 10/18/2022 CLINICAL DATA:  Headache; rule out dural venous sinus thrombosis, AVM EXAM: CT ANGIOGRAPHY HEAD CT VENOGRAM TECHNIQUE: Multidetector CT imaging  of the head was performed using the standard protocol during bolus administration of intravenous contrast. Multiplanar CT image reconstructions and MIPs were obtained to evaluate the vascular anatomy. Venographic phase images of the brain were obtained following  the administration of intravenous contrast. Multiplanar reformats and maximum intensity projections were generated. RADIATION DOSE REDUCTION: This exam was performed according to the departmental dose-optimization program which includes automated exposure control, adjustment of the mA and/or kV according to patient size and/or use of iterative reconstruction technique. CONTRAST:  101m OMNIPAQUE IOHEXOL 350 MG/ML SOLN COMPARISON:  04/18/2022 CTA head and neck, correlation is also made with 10/18/2022 CT head FINDINGS: CT HEAD FINDINGS For noncontrast findings, please see same day CT head. CTA HEAD FINDINGS Anterior circulation: Both internal carotid arteries are patent to the termini, without moderate stenosis in the bilateral cavernous segments and mild stenosis in the bilateral supraclinoid segments. A1 segments patent. Normal anterior communicating artery. Anterior cerebral arteries are patent to their distal aspects. No M1 stenosis or occlusion. MCA branches perfused and symmetric. Posterior circulation: Vertebral arteries patent to the vertebrobasilar junction with moderate stenosis in the right mid V4 posterior inferior cerebellar arteries patent proximally. Basilar patent to its distal aspect. Superior cerebellar arteries patent proximally. Patent P1 segments. PCAs perfused to their distal aspects without stenosis. The right posterior communicating artery is patent. Anatomic variants: None significant. Review of the MIP images confirms the above findings CT VENOGRAM Superior sagittal sinus: Normal. Straight sinus: Normal. Inferior sagittal sinus, vein of Galen and internal cerebral veins: Normal. Transverse sinuses: Normal on the left. Hypoplastic right transverse sinus, which appears similar to the 04/18/2022 CTA head. Small filling defects within the right transverse sinus are most likely arachnoid granulation and appear unchanged from the prior exam. Sigmoid sinuses: Normal on the left. Hypoplastic on the  right, unchanged from 04/18/2022. Visualized jugular veins: Normal IMPRESSION: 1. No intracranial large vessel occlusion. No evidence of vascular malformation. 2. 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 3. Moderate stenosis in the right mid V4 segment. 4. No evidence of venous sinus thrombosis or stenosis. The right transverse and sigmoid sinus are hypoplastic, which appears similar to the 04/18/2022 CTA head. Electronically Signed   By: AMerilyn BabaM.D.   On: 10/18/2022 19:55   CT Head Wo Contrast  Result Date: 10/18/2022 CLINICAL DATA:  Provided history: Headache, new onset. Right-sided head pain which began on Friday. EXAM: CT HEAD WITHOUT CONTRAST TECHNIQUE: Contiguous axial images were obtained from the base of the skull through the vertex without intravenous contrast. RADIATION DOSE REDUCTION: This exam was performed according to the departmental dose-optimization program which includes automated exposure control, adjustment of the mA and/or kV according to patient size and/or use of iterative reconstruction technique. COMPARISON:  CT angiogram head/neck 04/18/2022. Brain MRI 04/17/2022. Head CT 04/17/2022. FINDINGS: Brain: No age advanced or lobar predominant parenchymal atrophy. 1.3 cm hyperdense focus within the right occipital lobe (for instance as seen on series 2, image 14) (series 4, image 9) (series 5, image 22). Moderate patchy and ill-defined hypoattenuation within the cerebral white matter, nonspecific but compatible with chronic small vessel disease. Partially empty sella turcica. No demarcated cortical infarct. No extra-axial fluid collection. No midline shift. Vascular: No hyperdense vessel.  Atherosclerotic calcifications. Skull: No fracture or aggressive osseous lesion. Sinuses/Orbits: No mass or acute finding within the imaged orbits. No significant paranasal sinus disease at the imaged levels. These results were  called by telephone at the time  of interpretation on 10/18/2022 at 2:40 pm to provider Garnette Gunner , who verbally acknowledged these results. IMPRESSION: 1.3 cm hyperdense focus within the right occipital lobe. This may reflect an acute parenchymal hemorrhage. However, a brain MRI without and with contrast is recommended to exclude a primary mass or metastatic lesion. Moderate chronic small ischemic changes within the cerebral white matter. Electronically Signed   By: Kellie Simmering D.O.   On: 10/18/2022 14:46    PHYSICAL EXAM Constitutional: Appears well-developed and well-nourished.  Cardiovascular: Normal rate and regular rhythm.  Respiratory: Effort normal, non-labored breathing   Neuro: Mental Status: Patient is awake, alert, oriented to person, place, month, year, and situation. Patient is able to give a clear and coherent history. No signs of aphasia or neglect Cranial Nerves: II: Visual Fields are full. Pupils are equal, round, and reactive to light.   III,IV, VI: Left esotropia  V: Facial sensation is symmetric to temperature VII: Facial movement is symmetric.  VIII: hearing is intact to voice X: Uvula elevates symmetrically XI: Shoulder shrug is symmetric. XII: tongue is midline without atrophy or fasciculations.  Motor: Tone is normal. Bulk is normal. 5/5 strength was present in all four extremities.  Sensory: Sensation is symmetric to light touch and temperature in the arms and legs. Cerebellar: FNF intact bilaterally  ASSESSMENT/PLAN Ms. DHARA SCHEPP is a 81 y.o. female with history of T2DM, HTN, HLD, CKD stage IIIa, history of TIA on aspirin, OSA on CPAP who presented to the ED for evaluation of persistent headache.  Headache tolerable-   Stroke:  Right occipital lobe ICH Etiology:  uncontrolled HTN Code Stroke CT head 1.3 cm hyperdense focus within the right occipital lobe. This may reflect an acute parenchymal hemorrhage. CTA head & neck No LVO CTV- no  CVST MRI  Early subacute hemorrhage in the right occipital lobe, with a volume of approximately 1 mL. Surrounding edema without significant mass effect or midline shift. No evidence of active hemorrhage.  2. Multiple foci of hemosiderin deposition, most prominently in the parietal and occipital lobes; with the exception of the new subacute hemorrhage, the others appear unchanged compared to 04/17/2022. While these could be the sequela of hypertensive microhemorrhages, early cerebral amyloid angiopathy or multiple cavernomas could appear similar. LDL 98 HgbA1c 6.4 VTE prophylaxis - SCDs    Diet   Diet Heart Room service appropriate? Yes; Fluid consistency: Thin   aspirin 81 mg daily prior to admission, now on No antithrombotic given ICH Resume in 3-5 days Therapy recommendations:  pending Disposition:  pending  Hypertension Home meds:  Amlodipine, atenolol-chlorthalidone, valsartan Stable  Hyperlipidemia Home meds:  Crestor '20mg'$ , resumed in hospital Increase Crestor '20mg'$  LDL 98, goal < 70 Continue statin at discharge  Diabetes type II Controlled Home meds:  Trulicity, glipizide [id number] 6.4, goal < 7.0 CBGs Recent Labs    10/18/22 1745 10/18/22 1956 10/18/22 2218  GLUCAP 86 114* 92    SSI  Other Stroke Risk Factors Advanced Age >/= 65  Obesity, Body mass index is 37.61 kg/m., BMI >/= 30 associated with increased stroke risk, recommend weight loss, diet and exercise as appropriate  Hx stroke/TIA June 2023 TIA  Obstructive sleep apnea, on CPAP at home Part of sleep smart study   Other Active Problems Hypokalemia- 3.3- > replete and now resolved AKI on CKD  Cr 1.22 GFR 45  Hospital day # 0  Patient seen and examined by NP/APP with MD. MD to update note as needed.   Janine Ores,  DNP, FNP-BC [facility] Pager: [phone]  STROKE MD NOTE : I have personally obtained history,examined this patient, reviewed notes, independently viewed imaging  studies, participated in medical decision making and plan of care.ROS completed by me personally and pertinent positives fully documented  I have made any additions or clarifications directly to the above note. Agree with note above.  Patient presented with headaches for several days and CT scan and MRI showed subacute right parietal occipital parenchymal hemorrhage neurology indeterminate hypertensive versus amyloid angiopathy.  Recommend close neurological monitoring and strict blood pressure control with systolic goal below 628.  Mobilize out of bed.  Therapy consults.  Patient recently participated in the sleep smart study which she has just completed.  She may potentially qualify for participation in the [place] study ( statin versus no statin after ICH) if interested.  She and her daughter were given study information to review and decide. Discussed with patient, daughter and husband and answered questions.  Greater than 50% time during this 50-minute visit was spent in counseling and coordination of care about her intracerebral hemorrhage and discussion about prevention and treatment and answering questions. Antony Contras, MD Medical Director [facility] Pager: [phone] 10/19/2022 5:53 PM   To contact Stroke Continuity provider, please refer to [email or web address] After hours, contact General Neurology

## 2022-10-19 NOTE — Assessment & Plan Note (Signed)
Patient reports inconsistent use.  Could be contributing to uncontrolled hypertension. -Continue CPAP nightly

## 2022-10-19 NOTE — Hospital Course (Signed)
[name] is a 81 y.o. female with medical history significant for T2DM, HTN, HLD, CKD stage IIIa, history of TIA on aspirin/Plavix who is admitted for subacute intracranial hemorrhage of the right occipital lobe.

## 2022-10-20 ENCOUNTER — Observation Stay (HOSPITAL_COMMUNITY): Payer: Medicare Other

## 2022-10-20 DIAGNOSIS — I629 Nontraumatic intracranial hemorrhage, unspecified: Secondary | ICD-10-CM | POA: Diagnosis not present

## 2022-10-20 MED ORDER — HYDRALAZINE HCL 10 MG PO TABS: 10.0000 mg | ORAL_TABLET | Freq: Three times a day (TID) | ORAL | 1 refills | Status: DC

## 2022-10-20 MED ORDER — HYDRALAZINE HCL 10 MG PO TABS
10.0000 mg | ORAL_TABLET | Freq: Three times a day (TID) | ORAL | Status: DC
  Administered 2022-10-20 (×2): 10 mg via ORAL
  Filled 2022-10-20 (×2): qty 1

## 2022-10-20 MED ORDER — ATENOLOL-CHLORTHALIDONE 50-25 MG PO TABS: ORAL_TABLET | ORAL | 0 refills | Status: DC

## 2022-10-20 MED ORDER — VALSARTAN 80 MG PO TABS: 80.0000 mg | ORAL_TABLET | Freq: Every day | ORAL | 0 refills | Status: DC

## 2022-10-20 MED ORDER — AMLODIPINE BESYLATE 5 MG PO TABS: 5.0000 mg | ORAL_TABLET | Freq: Every day | ORAL | 0 refills | Status: DC

## 2022-10-20 NOTE — Care Management Obs Status (Signed)
[facility] NOTIFICATION   Patient Details  Name: [name] MRN: [id number] Date of Birth: 06-06-1941   Medicare Observation Status Notification Given:  Yes    Verdell Carmine, RN 10/20/2022, 2:49 PM

## 2022-10-20 NOTE — Progress Notes (Addendum)
STROKE TEAM PROGRESS NOTE   INTERVAL HISTORY Her husband is at the bedside.   She states the headaches are better.  She feels very tired and is resting in bed.  Blood pressure adequately controlled.  Patient is interested in participating in the [facility] trial but unfortunately she does not have a clear last seen normal and it appears to be about 7 to 8 days which is barely above the cutoff for participation in the study and hence we will not enroll her Follow-up CT scan of the head this morning shows stable appearance of the small right occipital parenchymal intracerebral hemorrhage Vitals:   10/20/22 0347 10/20/22 0400 10/20/22 0749 10/20/22 1103  BP: (!) 172/79 (!) 162/65 135/64 (!) 149/71  Pulse: 61  60 60  Resp: '16  16 16  '$ Temp: (!) 97.5 F (36.4 C)  97.6 F (36.4 C) 98 F (36.7 C)  TempSrc: Oral  Oral Oral  SpO2: 100%  98% 99%  Weight:      Height:       CBC:  Recent Labs  Lab 10/18/22 1547 10/19/22 0423  WBC 10.7* 8.8  NEUTROABS 6.4  --   HGB 15.3* 12.8  HCT 45.4 37.6  MCV 90.8 90.4  PLT 242 366   Basic Metabolic Panel:  Recent Labs  Lab 10/18/22 1547 10/19/22 0423  NA 142 139  K 3.3* 4.1  CL 102 103  CO2 29 26  GLUCOSE 43* 148*  BUN 21 17  CREATININE 1.14* 1.22*  CALCIUM 9.9 9.0   Lipid Panel: No results for input(s): "CHOL", "TRIG", "HDL", "CHOLHDL", "VLDL", "LDLCALC" in the last 168 hours. HgbA1c: No results for input(s): "HGBA1C" in the last 168 hours. Urine Drug Screen: No results for input(s): "LABOPIA", "COCAINSCRNUR", "LABBENZ", "AMPHETMU", "THCU", "LABBARB" in the last 168 hours.  Alcohol Level  Recent Labs  Lab 10/18/22 1547  ETH 10*    IMAGING past 24 hours CT HEAD WO CONTRAST (5MM)  Result Date: 10/20/2022 CLINICAL DATA:  Stroke, follow up EXAM: CT HEAD WITHOUT CONTRAST TECHNIQUE: Contiguous axial images were obtained from the base of the skull through the vertex without intravenous contrast. RADIATION DOSE REDUCTION: This exam was performed  according to the departmental dose-optimization program which includes automated exposure control, adjustment of the mA and/or kV according to patient size and/or use of iterative reconstruction technique. COMPARISON:  CT and MRI from 10/18/2022. FINDINGS: Brain: Unchanged hemorrhage in the right occipital lobe, further characterized on recent MRI. No evidence of acute large vascular territory infarct. No midline shift. No hydrocephalus. Patchy white matter hypodensities, nonspecific but compatible with chronic microvascular ischemic disease. Partially empty sella. Vascular: No hyperdense vessel. Skull: No acute fracture. Sinuses/Orbits: Clear sinuses.  No acute orbital findings. Other: No mastoid effusions. IMPRESSION: Unchanged hemorrhage in the right occipital lobe, further characterized on recent MRI. Electronically Signed   By: Margaretha Sheffield M.D.   On: 10/20/2022 13:40    PHYSICAL EXAM Constitutional: Appears well-developed and well-nourished.  Cardiovascular: Normal rate and regular rhythm.  Respiratory: Effort normal, non-labored breathing   Neuro: Mental Status: Patient is awake, alert, oriented to person, place, month, year, and situation. Patient is able to give a clear and coherent history. No signs of aphasia or neglect Cranial Nerves: II: Visual Fields are full. Pupils are equal, round, and reactive to light.   III,IV, VI: Left esotropia  V: Facial sensation is symmetric to temperature VII: Facial movement is symmetric.  VIII: hearing is intact to voice X: Uvula elevates symmetrically XI:  Shoulder shrug is symmetric. XII: tongue is midline without atrophy or fasciculations.  Motor: Tone is normal. Bulk is normal. 5/5 strength was present in all four extremities.  Sensory: Sensation is symmetric to light touch and temperature in the arms and legs. Cerebellar: FNF intact bilaterally  ASSESSMENT/PLAN Ms. SAVANAH BAYLES is a 81 y.o. female with history of T2DM, HTN, HLD,  CKD stage IIIa, history of TIA on aspirin, OSA on CPAP who presented to the ED for evaluation of persistent headache.  Headache tolerable-   Stroke:  Right occipital lobe ICH Etiology:  uncontrolled HTN Code Stroke CT head 1.3 cm hyperdense focus within the right occipital lobe. This may reflect an acute parenchymal hemorrhage. CTA head & neck No LVO CTV- no CVST MRI  Early subacute hemorrhage in the right occipital lobe, with a volume of approximately 1 mL. Surrounding edema without significant mass effect or midline shift. No evidence of active hemorrhage.  2. Multiple foci of hemosiderin deposition, most prominently in the parietal and occipital lobes; with the exception of the new subacute hemorrhage, the others appear unchanged compared to 04/17/2022. While these could be the sequela of hypertensive microhemorrhages, early cerebral amyloid angiopathy or multiple cavernomas could appear similar. LDL 98 HgbA1c 6.4 VTE prophylaxis - SCDs    Diet   Diet Heart Room service appropriate? Yes; Fluid consistency: Thin   aspirin 81 mg daily prior to admission, now on No antithrombotic given ICH Resume in 3-5 days Therapy recommendations:  pending Disposition:  pending  Hypertension Home meds:  Amlodipine, atenolol-chlorthalidone, valsartan Stable  Hyperlipidemia Home meds:  Crestor '20mg'$ , resumed in hospital Increase Crestor '20mg'$  LDL 98, goal < 70 Continue statin at discharge  Diabetes type II Controlled Home meds:  Trulicity, glipizide [id number] 6.4, goal < 7.0 CBGs Recent Labs    10/18/22 1956 10/18/22 2218 10/19/22 1628  GLUCAP 114* 92 149*    SSI  Other Stroke Risk Factors Advanced Age >/= 65  Obesity, Body mass index is 37.61 kg/m., BMI >/= 30 associated with increased stroke risk, recommend weight loss, diet and exercise as appropriate  Hx stroke/TIA June 2023 TIA  Obstructive sleep apnea, on CPAP at home Part of sleep smart study   Other Active  Problems Hypokalemia- 3.3- > replete and now resolved AKI on CKD  Cr 1.22 GFR 45  Hospital day # 0    Patient presented with headaches for several days and CT scan and MRI showed subacute right parietal occipital parenchymal hemorrhage neurology indeterminate hypertensive versus amyloid angiopathy.  Recommend Mobilize out of bed.  Therapy consults.   Discharge in the next 1 to 2 days if stable.  Follow-up as an outpatient stroke clinic with nurse practitioner in 2 months.  Stroke team will sign off.  Kindly call for questions.  Discussed with Dr.Gherghe.  Greater than 50% time during this 35-minute visit was spent in counseling and coordination of care about his intracerebral hemorrhage and discussion with patient and family and care team and answering questions.  Antony Contras, MD Medical Director [facility] Pager: [phone] 10/20/2022 3:49 PM   To contact Stroke Continuity provider, please refer to [email or web address] After hours, contact General Neurology

## 2022-10-20 NOTE — Discharge Summary (Signed)
Physician Discharge Summary  [name] WUJ:[id number] DOB: Aug 10, 1941 DOA: 10/18/2022  PCP: Tilda Franco, FNP  Admit date: 10/18/2022 Discharge date: 10/20/2022  Admitted From: home Disposition:  home  Recommendations for Outpatient Follow-up:  Follow up with PCP in 1-2 weeks Please obtain BMP/CBC in one week  Home Health: none Equipment/Devices: none  Discharge Condition: stable CODE STATUS: Full code Diet Orders (From admission, onward)     Start     Ordered   10/19/22 0039  Diet Heart Room service appropriate? Yes; Fluid consistency: Thin  Diet effective now       Question Answer Comment  Room service appropriate? Yes   Fluid consistency: Thin      10/19/22 0039            HPI: Per admitting MD, osephine W [name] is a 81 y.o. female with medical history significant for T2DM, HTN, HLD, CKD stage IIIa, history of TIA on aspirin, OSA on CPAP who presented to the ED for evaluation of persistent headache. Patient reports new onset of right-sided headache beginning 5 days ago.  She describes sensation as a stabbing pain.  Headache has been on and off.  She says her blood pressure has been running high at home.  She has not had focal weakness, chest pain, dyspnea, nausea, vomiting, abdominal pain.CT head without contrast showed a 1.3 cm hypodense focus within the right occipital lobe.   Hospital Course / Discharge diagnoses: Principal Problem:   Subacute intracranial hemorrhage ([place]) Active Problems:   Essential hypertension   Type 2 diabetes mellitus with hypoglycemia (HCC)   Chronic kidney disease, stage 3a (HCC)   OSA on CPAP   Hypokalemia   Principal problem Subacute ICH-MRI of the brain on admission showed early subacute hemorrhage in the right occipital lobe, approximately 1 mm.  It also showed foci of hemosiderin deposition likely sequela of hypertensive microhemorrhages, early cerebral amyloid angiopathy or multiple cavernoma's.  Neurology consulted  and followed patient while hospitalized.  This was believed to be in the setting of poorly controlled high blood pressure.  She was placed back on her home regimen, hydralazine was added with improvement in her blood pressure, currently at goal, and she is feeling back to baseline, normal headaches, dizziness and ambulating in the hallway without difficulties.  Repeat CT scan of the brain shows stability. She will be discharged home in stable condition.  Active problems Essential hypertension-continue home regimen, hydralazine had to be added.  She is now better controlled, also reported missing some blood pressure medication doses at home.  I discussed with her and her son at bedside, it is important to take medications as prescribed, also to follow dietary guidelines regarding hypertension, especially limiting sodium intake.  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  She would benefit from weight loss.  Sepsis ruled out   Discharge Instructions   Allergies as of 10/20/2022       Reactions   Bee Venom Hives   Latex Hives, Itching   Codeine Other (See Comments)   Hallucinations   Hydrocodone Other (See Comments)   hallucinations   Oxycodone Other (See Comments)   hallucinations        Medication List     STOP taking these medications    aspirin EC 81 MG tablet       TAKE these medications    allopurinol 100  MG tablet Commonly known as: ZYLOPRIM Take 100 mg by mouth daily.   amLODipine 5 MG tablet Commonly known as: NORVASC Take 1 tablet (5 mg total) by mouth daily.   atenolol-chlorthalidone 50-25 MG tablet Commonly known as: TENORETIC Take 1/2 tablet by mouth daily   Blood Pressure Kit 1 Units by Does not apply route daily. Check blood pressure daily for hypertension dx code I10   estradiol 0.1 MG/GM  vaginal cream Commonly known as: ESTRACE Insert blueberry size amount of cream on finger (or 0.5 grams with applicator) in vagina daily x2 week then 2x per week.   glipiZIDE 10 MG tablet Commonly known as: GLUCOTROL Take 10 mg by mouth daily.   hydrALAZINE 10 MG tablet Commonly known as: APRESOLINE Take 1 tablet (10 mg total) by mouth 3 (three) times daily.   montelukast 10 MG tablet Commonly known as: SINGULAIR Take 10 mg by mouth daily.   multivitamin tablet Take 1 tablet by mouth daily.   OneTouch Delica Plus [id number] Misc USE TO CHECK BLOOD SUGAR TWICE DAILY   OneTouch Verio test strip Generic drug: glucose blood USE TO TEST 2 TIMES DAILY   rosuvastatin 20 MG tablet Commonly known as: CRESTOR TAKE 1 TABLET(20 MG) BY MOUTH DAILY What changed:  how much to take how to take this when to take this additional instructions   solifenacin 5 MG tablet Commonly known as: VESICARE Take 5 mg by mouth every morning.   Trulicity 9.38 HW/2.9HB Sopn Generic drug: Dulaglutide Inject 0.5 mg into the skin every 14 (fourteen) days. Every other Friday   valsartan 80 MG tablet Commonly known as: Diovan Take 1 tablet (80 mg total) by mouth daily.       Consultations: Neurology  Procedures/Studies:  CT HEAD WO CONTRAST (5MM)  Result Date: 10/20/2022 CLINICAL DATA:  Stroke, follow up EXAM: CT HEAD WITHOUT CONTRAST TECHNIQUE: Contiguous axial images were obtained from the base of the skull through the vertex without intravenous contrast. RADIATION DOSE REDUCTION: This exam was performed according to the departmental dose-optimization program which includes automated exposure control, adjustment of the mA and/or kV according to patient size and/or use of iterative reconstruction technique. COMPARISON:  CT and MRI from 10/18/2022. FINDINGS: Brain: Unchanged hemorrhage in the right occipital lobe, further characterized on recent MRI. No evidence of acute large vascular territory  infarct. No midline shift. No hydrocephalus. Patchy white matter hypodensities, nonspecific but compatible with chronic microvascular ischemic disease. Partially empty sella. Vascular: No hyperdense vessel. Skull: No acute fracture. Sinuses/Orbits: Clear sinuses.  No acute orbital findings. Other: No mastoid effusions. IMPRESSION: Unchanged hemorrhage in the right occipital lobe, further characterized on recent MRI. Electronically Signed   By: Margaretha Sheffield M.D.   On: 10/20/2022 13:40   MR Brain W and Wo Contrast  Result Date: 10/18/2022 CLINICAL DATA:  Headache EXAM: MRI HEAD WITHOUT AND WITH CONTRAST TECHNIQUE: Multiplanar, multiecho pulse sequences of the brain and surrounding structures were obtained without and with intravenous contrast. CONTRAST:  33m GADAVIST GADOBUTROL 1 MMOL/ML IV SOLN COMPARISON:  04/17/2022 MRI head, correlation is also made with 10/18/2022 CT head FINDINGS: Brain: T1 hyperintense, T2 hypointense signal in the right occipital lobe, consistent with early subacute hemorrhage (2-7 days), which measures 9 x 17 x 12 mm (AP x TR x CC) (series 8, image 21 and series 4, image 11), with a volume of approximately 1 mL. Surrounding T2 hyperintense signal, likely edema without significant mass effect or midline shift. No restricted diffusion to suggest acute or  subacute infarct. No abnormal parenchymal or meningeal enhancement. No evidence of contrast enhancement within the hemorrhage to suggest active extravasation. No acute mass, mass effect, or midline shift. No hydrocephalus or extra-axial collection. Low lying cerebellar tonsils, which extend 1-2 mm below the foramen magnum and do not meet criteria for Chiari I malformation. Scattered and confluent T2 hyperintense signal in the periventricular white matter, likely the sequela of moderate chronic small vessel ischemic disease. Multiple foci of hemosiderin deposition, most prominently in the parietal and occipital lobes; with the  exception of the new subacute hemorrhage, the other foci appear unchanged compared to 04/17/2022. Vascular: Normal arterial flow voids. Normal arterial and venous enhancement. Skull and upper cervical spine: Normal marrow signal. Sinuses/Orbits: Clear paranasal sinuses. Status post bilateral lens replacements. Other: The mastoids are well aerated. IMPRESSION: 1. Early subacute hemorrhage in the right occipital lobe, with a volume of approximately 1 mL. Surrounding edema without significant mass effect or midline shift. No evidence of active hemorrhage. 2. Multiple foci of hemosiderin deposition, most prominently in the parietal and occipital lobes; with the exception of the new subacute hemorrhage, the others appear unchanged compared to 04/17/2022. While these could be the sequela of hypertensive microhemorrhages, early cerebral amyloid angiopathy or multiple cavernomas could appear similar. Electronically Signed   By: Merilyn Baba M.D.   On: 10/18/2022 22:26   CT ANGIO HEAD W OR WO CONTRAST  Result Date: 10/18/2022 CLINICAL DATA:  Headache; rule out dural venous sinus thrombosis, AVM EXAM: CT ANGIOGRAPHY HEAD CT VENOGRAM TECHNIQUE: Multidetector CT imaging of the head was performed using the standard protocol during bolus administration of intravenous contrast. Multiplanar CT image reconstructions and MIPs were obtained to evaluate the vascular anatomy. Venographic phase images of the brain were obtained following the administration of intravenous contrast. Multiplanar reformats and maximum intensity projections were generated. RADIATION DOSE REDUCTION: This exam was performed according to the departmental dose-optimization program which includes automated exposure control, adjustment of the mA and/or kV according to patient size and/or use of iterative reconstruction technique. CONTRAST:  56m OMNIPAQUE IOHEXOL 350 MG/ML SOLN COMPARISON:  04/18/2022 CTA head and neck, correlation is also made with  10/18/2022 CT head FINDINGS: CT HEAD FINDINGS For noncontrast findings, please see same day CT head. CTA HEAD FINDINGS Anterior circulation: Both internal carotid arteries are patent to the termini, without moderate stenosis in the bilateral cavernous segments and mild stenosis in the bilateral supraclinoid segments. A1 segments patent. Normal anterior communicating artery. Anterior cerebral arteries are patent to their distal aspects. No M1 stenosis or occlusion. MCA branches perfused and symmetric. Posterior circulation: Vertebral arteries patent to the vertebrobasilar junction with moderate stenosis in the right mid V4 posterior inferior cerebellar arteries patent proximally. Basilar patent to its distal aspect. Superior cerebellar arteries patent proximally. Patent P1 segments. PCAs perfused to their distal aspects without stenosis. The right posterior communicating artery is patent. Anatomic variants: None significant. Review of the MIP images confirms the above findings CT VENOGRAM Superior sagittal sinus: Normal. Straight sinus: Normal. Inferior sagittal sinus, vein of Galen and internal cerebral veins: Normal. Transverse sinuses: Normal on the left. Hypoplastic right transverse sinus, which appears similar to the 04/18/2022 CTA head. Small filling defects within the right transverse sinus are most likely arachnoid granulation and appear unchanged from the prior exam. Sigmoid sinuses: Normal on the left. Hypoplastic on the right, unchanged from 04/18/2022. Visualized jugular veins: Normal IMPRESSION: 1. No intracranial large vessel occlusion. No evidence of vascular malformation. 2. Moderate stenosis in the bilateral  cavernous segments and mild stenosis in the bilateral supraclinoid segments. 3. Moderate stenosis in the right mid V4 segment. 4. No evidence of venous sinus thrombosis or stenosis. The right transverse and sigmoid sinus are hypoplastic, which appears similar to the 04/18/2022 CTA head.  Electronically Signed   By: Merilyn Baba M.D.   On: 10/18/2022 19:55   CT VENOGRAM HEAD  Result Date: 10/18/2022 CLINICAL DATA:  Headache; rule out dural venous sinus thrombosis, AVM EXAM: CT ANGIOGRAPHY HEAD CT VENOGRAM TECHNIQUE: Multidetector CT imaging of the head was performed using the standard protocol during bolus administration of intravenous contrast. Multiplanar CT image reconstructions and MIPs were obtained to evaluate the vascular anatomy. Venographic phase images of the brain were obtained following the administration of intravenous contrast. Multiplanar reformats and maximum intensity projections were generated. RADIATION DOSE REDUCTION: This exam was performed according to the departmental dose-optimization program which includes automated exposure control, adjustment of the mA and/or kV according to patient size and/or use of iterative reconstruction technique. CONTRAST:  69m OMNIPAQUE IOHEXOL 350 MG/ML SOLN COMPARISON:  04/18/2022 CTA head and neck, correlation is also made with 10/18/2022 CT head FINDINGS: CT HEAD FINDINGS For noncontrast findings, please see same day CT head. CTA HEAD FINDINGS Anterior circulation: Both internal carotid arteries are patent to the termini, without moderate stenosis in the bilateral cavernous segments and mild stenosis in the bilateral supraclinoid segments. A1 segments patent. Normal anterior communicating artery. Anterior cerebral arteries are patent to their distal aspects. No M1 stenosis or occlusion. MCA branches perfused and symmetric. Posterior circulation: Vertebral arteries patent to the vertebrobasilar junction with moderate stenosis in the right mid V4 posterior inferior cerebellar arteries patent proximally. Basilar patent to its distal aspect. Superior cerebellar arteries patent proximally. Patent P1 segments. PCAs perfused to their distal aspects without stenosis. The right posterior communicating artery is patent. Anatomic variants: None  significant. Review of the MIP images confirms the above findings CT VENOGRAM Superior sagittal sinus: Normal. Straight sinus: Normal. Inferior sagittal sinus, vein of Galen and internal cerebral veins: Normal. Transverse sinuses: Normal on the left. Hypoplastic right transverse sinus, which appears similar to the 04/18/2022 CTA head. Small filling defects within the right transverse sinus are most likely arachnoid granulation and appear unchanged from the prior exam. Sigmoid sinuses: Normal on the left. Hypoplastic on the right, unchanged from 04/18/2022. Visualized jugular veins: Normal IMPRESSION: 1. No intracranial large vessel occlusion. No evidence of vascular malformation. 2. 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 3. Moderate stenosis in the right mid V4 segment. 4. No evidence of venous sinus thrombosis or stenosis. The right transverse and sigmoid sinus are hypoplastic, which appears similar to the 04/18/2022 CTA head. Electronically Signed   By: AMerilyn BabaM.D.   On: 10/18/2022 19:55   CT Head Wo Contrast  Result Date: 10/18/2022 CLINICAL DATA:  Provided history: Headache, new onset. Right-sided head pain which began on Friday. EXAM: CT HEAD WITHOUT CONTRAST TECHNIQUE: Contiguous axial images were obtained from the base of the skull through the vertex without intravenous contrast. RADIATION DOSE REDUCTION: This exam was performed according to the departmental dose-optimization program which includes automated exposure control, adjustment of the mA and/or kV according to patient size and/or use of iterative reconstruction technique. COMPARISON:  CT angiogram head/neck 04/18/2022. Brain MRI 04/17/2022. Head CT 04/17/2022. FINDINGS: Brain: No age advanced or lobar predominant parenchymal atrophy. 1.3 cm hyperdense focus within the right occipital lobe (for instance as seen on  series 2, image 14) (series 4, image 9) (series 5,  image 22). Moderate patchy and ill-defined hypoattenuation within the cerebral white matter, nonspecific but compatible with chronic small vessel disease. Partially empty sella turcica. No demarcated cortical infarct. No extra-axial fluid collection. No midline shift. Vascular: No hyperdense vessel.  Atherosclerotic calcifications. Skull: No fracture or aggressive osseous lesion. Sinuses/Orbits: No mass or acute finding within the imaged orbits. No significant paranasal sinus disease at the imaged levels. These results were called by telephone at the time of interpretation on 10/18/2022 at 2:40 pm to provider [facility] , who verbally acknowledged these results. IMPRESSION: 1.3 cm hyperdense focus within the right occipital lobe. This may reflect an acute parenchymal hemorrhage. However, a brain MRI without and with contrast is recommended to exclude a primary mass or metastatic lesion. Moderate chronic small ischemic changes within the cerebral white matter. Electronically Signed   By: Kellie Simmering D.O.   On: 10/18/2022 14:46   DG Chest 2 View  Result Date: 10/16/2022 CLINICAL DATA:  cp EXAM: CHEST - 2 VIEW COMPARISON:  10/14/2022. FINDINGS: The heart size and mediastinal contours are within normal limits. Both lungs are clear. No pneumothorax or pleural effusion. Aorta is calcified. There are thoracic degenerative changes. IMPRESSION: No active cardiopulmonary disease. Electronically Signed   By: Sammie Bench M.D.   On: 10/16/2022 14:14   DG Chest 2 View  Result Date: 10/14/2022 CLINICAL DATA:  81 year old female with chest pain, hypertension and chest tightness. One hundred ninety-four systolic. EXAM: CHEST - 2 VIEW COMPARISON:  Chest CTA 04/08/2005. Chest radiographs 03/02/2022 and earlier. FINDINGS: PA and lateral views at 0430 hours. Moderate chronic eventration or elevation of the right hemidiaphragm has not significantly changed since 2006. overall normal lung volumes. Normal cardiac size  and mediastinal contours. Visualized tracheal air column is within normal limits. Lung markings appear stable and clear. No pneumothorax or pleural effusion. No acute osseous abnormality identified. Extensive cholelithiasis is evident in the upper abdomen. Negative visible bowel gas. Abdominal Calcified aortic atherosclerosis. IMPRESSION: 1. No acute cardiopulmonary abnormality. 2. Extensive chronic cholelithiasis. 3.  Aortic Atherosclerosis (ICD10-I70.0). Electronically Signed   By: Genevie Ann M.D.   On: 10/14/2022 05:42     Subjective: - no chest pain, shortness of breath, no abdominal pain, nausea or vomiting.   Discharge Exam: BP (!) 149/71 (BP Location: Right Wrist)   Pulse 60   Temp 98 F (36.7 C) (Oral)   Resp 16   Ht _0  (1.676 m)   Wt 105.7 kg   SpO2 99%   BMI 37.61 kg/m   General: Pt is alert, awake, not in acute distress Cardiovascular: RRR, S1/S2 +, no rubs, no gallops Respiratory: CTA bilaterally, no wheezing, no rhonchi Abdominal: Soft, NT, ND, bowel sounds + Extremities: no edema, no cyanosis   The results of significant diagnostics from this hospitalization (including imaging, microbiology, ancillary and laboratory) are listed below for reference.     Microbiology: No results found for this or any previous visit (from the past 240 hour(s)).   Labs: Basic Metabolic Panel: Recent Labs  Lab 10/14/22 0336 10/16/22 1350 10/18/22 1547 10/19/22 0423  NA 136 140 142 139  K 4.1 3.9 3.3* 4.1  CL 103 104 102 103  CO2 _1 GLUCOSE 115* 150* 43* 148*  BUN _2 CREATININE 0.90 1.01* 1.14* 1.22*  CALCIUM 9.4 9.3 9.9 9.0   Liver Function Tests: Recent Labs  Lab 10/18/22 1547  AST  28  ALT 23  ALKPHOS 42  BILITOT 1.1  PROT 7.7  ALBUMIN 4.5   CBC: Recent Labs  Lab 10/14/22 0336 10/16/22 1350 10/18/22 1547 10/19/22 0423  WBC 10.2 8.8 10.7* 8.8  NEUTROABS  --   --  6.4  --   HGB 14.0 14.2 15.3* 12.8  HCT 42.0 43.1 45.4 37.6  MCV 90.1  91.9 90.8 90.4  PLT 198 204 242 199   CBG: Recent Labs  Lab 10/18/22 1745 10/18/22 1956 10/18/22 2218 10/19/22 1628  GLUCAP 86 114* 92 149*   Hgb A1c No results for input(s): "HGBA1C" in the last 72 hours. Lipid Profile No results for input(s): "CHOL", "HDL", "LDLCALC", "TRIG", "CHOLHDL", "LDLDIRECT" in the last 72 hours. Thyroid function studies No results for input(s): "TSH", "T4TOTAL", "T3FREE", "THYROIDAB" in the last 72 hours.  Invalid input(s): "FREET3" Urinalysis    Component Value Date/Time   COLORURINE COLORLESS (A) 10/18/2022 [place] 10/18/2022 1535   LABSPEC 1.006 10/18/2022 1535   PHURINE 6.5 10/18/2022 1535   GLUCOSEU NEGATIVE 10/18/2022 1535   HGBUR NEGATIVE 10/18/2022 1535   HGBUR negative 07/18/2010 0915   BILIRUBINUR NEGATIVE 10/18/2022 1535   BILIRUBINUR negative 01/07/2022 1214   BILIRUBINUR Negative 07/12/2021 [place] 10/18/2022 1535   PROTEINUR NEGATIVE 10/18/2022 1535   UROBILINOGEN 0.2 01/07/2022 1214   UROBILINOGEN 1.0 01/24/2015 0131   NITRITE NEGATIVE 10/18/2022 1535   LEUKOCYTESUR NEGATIVE 10/18/2022 1535    FURTHER DISCHARGE INSTRUCTIONS:   Get Medicines reviewed and adjusted: Please take all your medications with you for your next visit with your Primary MD   Laboratory/radiological data: Please request your Primary MD to go over all hospital tests and procedure/radiological results at the follow up, please ask your Primary MD to get all Hospital records sent to his/her office.   In some cases, they will be blood work, cultures and biopsy results pending at the time of your discharge. Please request that your primary care M.D. goes through all the records of your hospital data and follows up on these results.   Also Note the following: If you experience worsening of your admission symptoms, develop shortness of breath, life threatening emergency, suicidal or homicidal thoughts you must seek medical  attention immediately by calling 911 or calling your MD immediately  if symptoms less severe.   You must read complete instructions/literature along with all the possible adverse reactions/side effects for all the Medicines you take and that have been prescribed to you. Take any new Medicines after you have completely understood and accpet all the possible adverse reactions/side effects.    Do not drive when taking Pain medications or sleeping medications (Benzodaizepines)   Do not take more than prescribed Pain, Sleep and Anxiety Medications. It is not advisable to combine anxiety,sleep and pain medications without talking with your primary care practitioner   Special Instructions: If you have smoked or chewed Tobacco  in the last 2 yrs please stop smoking, stop any regular Alcohol  and or any Recreational drug use.   Wear Seat belts while driving.   Please note: You were cared for by a hospitalist during your hospital stay. Once you are discharged, your primary care physician will handle any further medical issues. Please note that NO REFILLS for any discharge medications will be authorized once you are discharged, as it is imperative that you return to your primary care physician (or establish a relationship with a primary care physician if you do not have  one) for your post hospital discharge needs so that they can reassess your need for medications and monitor your lab values.  Time coordinating discharge: 40 minutes  SIGNED:  [name], MD, PhD 10/20/2022, 1:56 PM

## 2022-12-01 NOTE — Progress Notes (Unsigned)
NEUROLOGY CONSULTATION NOTE  [name] MRN: [id number] DOB: 01-16-1941  Referring provider: Gean Quint, MD Primary care provider: Tilda Franco, FNP  Reason for consult:  Intracranial hemorrhage  Assessment/Plan:   Right occipital parenchymal hemorrhage - hypertensive Hypertension Type 2 diabetes mellitus Hyperlipidemia   As managed by PCP: May resume ASA '81mg'$  daily Normotensive blood pressure Continue statin.  LDL goal less than 70 Glycemic control.  Hgb A1c goal less than 7   Mediterranean diet Follow up 6 months   Subjective:  [name] is an 82 year old female with HTN, DM II, and history of TIA who presents for intracranial hemorrhage.  History supplemented by her accompanying husband and hospital records.  On 12/15, she began experiencing acute onset of new headaches, described as severe paroxysmal right occipital-temporal stabbing headaches lasting a few seconds and occurring about every 10 minutes.  No associated nausea, vomiting, new visual disturbance, or unilateral numbness or weakness.  She took her blood pressure which was in the 376E systolic.  She went to the ED on 12/18 but left before being seen.  Headaches continued, so she returned to a different ED at [facility] on 12/20 where CT head revealed a 1.3 cm hyperdense focus within the right occipital lobe suspicious for hemorrhage.  CTA of head revealed no LVO or other vascular abnormality.  CTV of head revealed no cerebral venous sinus thrombosis.  She was transferred to [facility] for continued workup and management.  MRI of brain revealed early subacute hemorrage within the right occipital lobe.  She was already on ASA '81mg'$ , which was put on hold.  LDL 98.  Hgb A1c 6.4.  She was admitted and monitored.  Repeat CT Head on 12/22 revealed hemorrhage was unchanged.  She was discharged in stable condition.  She is still not on ASA. Reports that she has trouble seeing, worse since the hospital  but ongoing prior to the hospitalization.  She followed up with her eye doctor and exam is stable.    10/18/2022 CT HEAD WO:  1.3 cm hyperdense focus within the right occipital lobe. This may reflect an acute parenchymal hemorrhage. However, a brain MRI without and with contrast is recommended to exclude a primary mass or metastatic lesion.  Moderate chronic small ischemic changes within the cerebral white matter. 10/18/2022 CTA/CTV HEAD:  1. No intracranial large vessel occlusion. No evidence of vascular malformation.  2. 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  3. Moderate stenosis in the right mid V4 segment.  4. No evidence of venous sinus thrombosis or stenosis. The right transverse and sigmoid sinus are hypoplastic, which appears similar to the 04/18/2022 CTA head. 10/18/2022 MRI BRAIN W WO:  1. Early subacute hemorrhage in the right occipital lobe, with a volume of approximately 1 mL. Surrounding edema without significant mass effect or midline shift. No evidence of active hemorrhage.  2. Multiple foci of hemosiderin deposition, most prominently in the parietal and occipital lobes; with the exception of the new subacute hemorrhage, the others appear unchanged compared to 04/17/2022.  While these could be the sequela of hypertensive microhemorrhages, early cerebral amyloid angiopathy or multiple cavernomas could appear similar. 10/20/2022 CT HEAD WO:  Unchanged hemorrhage in the right occipital lobe, further characterized on recent MRI.  PAST MEDICAL HISTORY: Past Medical History:  Diagnosis Date   Abdominal pain    Arthritis    cervical disc degeneration/ oa left knee, carpal tunnel rt  wrist, adhesive capsulitis right shoulder, rt hand weakness; lumbar degeneration   Carpal tunnel syndrome    Complication of anesthesia 2006-at [facility]   breathing problems-no BP med given prior to surgery;  hx of being very sleepy after  colon surgery --  states no problems with last right total knee replacement 2013   Constipation    Diabetes mellitus without complication ([place])    borderline - diet control   Diverticulitis hx of   Diverticulosis    E. coli infection    2020   Frequent UTI    hx of urethral injury during colon surgery - states frequent uti's since   GERD (gastroesophageal reflux disease)    H/O hiatal hernia    History of palpitations    in the past   History of shingles    has a lingering itching on back where shingles were   Hyperlipidemia    Hypertension    Numbness and tingling in right hand    pt. states has numbness of right hand very frequently-watch positioning   Obesity    Osteoarthritis    Osteoporosis    Pain    pain left knee and pain right hip and right groin   Personal history of colonic polyps-adenoma 08/26/2008   Pneumonia 2005   Shortness of breath 09/14/2021   Vitamin D deficiency     PAST SURGICAL HISTORY: Past Surgical History:  Procedure Laterality Date   ABDOMINAL HYSTERECTOMY     bladder tack     BREAST BIOPSY Left    BREAST SURGERY     breast duct resection- benign   CARPAL TUNNEL RELEASE Right 06/04/2014   Procedure: RIGHT CARPAL TUNNEL RELEASE;  Surgeon: Roseanne Kaufman, MD;  Location: [facility];  Service: Orthopedics;  Laterality: Right;   COLON RESECTION  2008   hx diverticulosis   COLON SURGERY     COLONOSCOPY     colonoscopy [id number]     JOINT REPLACEMENT     OOPHORECTOMY     POLYPECTOMY     skin graft left arm - traumatic compression injury left upper arm     temporary ureter stent     TOTAL KNEE ARTHROPLASTY  01/05/2012   Procedure: TOTAL KNEE ARTHROPLASTY;  Surgeon: [name], MD;  Location: [facility];  Service: Orthopedics;  Laterality: Right;   TOTAL KNEE ARTHROPLASTY Left 08/17/2014   Procedure: LEFT TOTAL KNEE ARTHROPLASTY;  Surgeon: [name], MD;  Location: [facility];  Service: Orthopedics;  Laterality: Left;    ureter repair for tyransected left ureter      MEDICATIONS: Current Outpatient Medications on File Prior to Visit  Medication Sig Dispense Refill   allopurinol (ZYLOPRIM) 100 MG tablet Take 100 mg by mouth daily.     amLODipine (NORVASC) 5 MG tablet Take 1 tablet (5 mg total) by mouth daily. 30 tablet 0   atenolol-chlorthalidone (TENORETIC) 50-25 MG tablet Take 1/2 tablet by mouth daily 30 tablet 0   Blood Pressure KIT 1 Units by Does not apply route daily. Check blood pressure daily for hypertension dx code I10 1 kit 0   Dulaglutide (TRULICITY) 2.83 MO/2.9UT SOPN Inject 0.5 mg into the skin every 14 (fourteen) days. Every other Friday     estradiol (ESTRACE) 0.1 MG/GM vaginal cream Insert blueberry size amount of cream on finger (or 0.5 grams with applicator) in vagina daily x2 week then 2x per week.     glipiZIDE (GLUCOTROL) 10 MG tablet Take 10 mg by mouth daily.  hydrALAZINE (APRESOLINE) 10 MG tablet Take 1 tablet (10 mg total) by mouth 3 (three) times daily. 90 tablet 1   Lancets (ONETOUCH DELICA PLUS YIRSWN46E) MISC USE TO CHECK BLOOD SUGAR TWICE DAILY 100 each 3   montelukast (SINGULAIR) 10 MG tablet Take 10 mg by mouth daily.     Multiple Vitamin (MULTIVITAMIN) tablet Take 1 tablet by mouth daily. (Patient not taking: Reported on 10/19/2022) 90 tablet 1   ONETOUCH VERIO test strip USE TO TEST 2 TIMES DAILY 100 strip 3   rosuvastatin (CRESTOR) 20 MG tablet TAKE 1 TABLET(20 MG) BY MOUTH DAILY (Patient taking differently: Take 20 mg by mouth daily.) 90 tablet 1   solifenacin (VESICARE) 5 MG tablet Take 5 mg by mouth every morning.     valsartan (DIOVAN) 80 MG tablet Take 1 tablet (80 mg total) by mouth daily. 30 tablet 0   No current facility-administered medications on file prior to visit.    ALLERGIES: Allergies  Allergen Reactions   Bee Venom Hives   Latex Hives and Itching   Codeine Other (See Comments)    Hallucinations    Hydrocodone Other (See Comments)     hallucinations   Oxycodone Other (See Comments)    hallucinations    FAMILY HISTORY: Family History  Problem Relation Age of Onset   Breast cancer Mother 29   Hypertension Mother    Prostate cancer Father    Hypertension Father    Dementia Sister    Lung cancer Brother    Hypertension Brother    COPD Brother    Cancer Maternal Grandmother    Arthritis Sister    Hypertension Sister    Arthritis Sister    Hypertension Child    Hypertension Child    Hypertension Child    Colon polyps Neg Hx    Esophageal cancer Neg Hx    Rectal cancer Neg Hx    Stomach cancer Neg Hx    Colon cancer Neg Hx     Objective:  Blood pressure 136/78, pulse (!) 56, height '5\' 6"'$  (1.676 m), weight 230 lb (104.3 kg), SpO2 (!) 78 %. General: No acute distress.  Patient appears well-groomed.   Head:  Normocephalic/atraumatic Eyes:  fundi examined but not visualized Neck: supple, no paraspinal tenderness, full range of motion Back: No paraspinal tenderness Heart: regular rate and rhythm Lungs: Clear to auscultation bilaterally. Vascular: No carotid bruits. Neurological Exam: Mental status: alert and oriented to person, place, and time, speech fluent and not dysarthric, language intact. Cranial nerves: CN I: not tested CN II: pupils equal, round and reactive to light, visual fields intact CN III, IV, VI:  full range of motion, no nystagmus, no ptosis CN V: facial sensation intact. CN VII: upper and lower face symmetric CN VIII: hearing intact CN IX, X: gag intact, uvula midline CN XI: sternocleidomastoid and trapezius muscles intact CN XII: tongue midline Bulk & Tone: normal, no fasciculations. Motor:  muscle strength 5/5 throughout Sensation:  Pinprick, temperature and vibratory sensation intact. Deep Tendon Reflexes:  2+ throughout,  toes downgoing.   Finger to nose testing:  Without dysmetria.   Heel to shin:  Without dysmetria.   Gait:  Normal station and stride.  Romberg  negative.    Thank you for allowing me to take part in the care of this patient.  Metta Clines, DO  CC: [name], FNP

## 2022-12-04 ENCOUNTER — Ambulatory Visit: Payer: Medicare Other | Admitting: Neurology

## 2022-12-04 VITALS — BP 136/78 | HR 56 | Ht 66.0 in | Wt 230.0 lb

## 2022-12-04 DIAGNOSIS — E1122 Type 2 diabetes mellitus with diabetic chronic kidney disease: Secondary | ICD-10-CM

## 2022-12-04 DIAGNOSIS — N1831 Chronic kidney disease, stage 3a: Secondary | ICD-10-CM

## 2022-12-04 DIAGNOSIS — I612 Nontraumatic intracerebral hemorrhage in hemisphere, unspecified: Secondary | ICD-10-CM | POA: Diagnosis not present

## 2022-12-04 DIAGNOSIS — E785 Hyperlipidemia, unspecified: Secondary | ICD-10-CM

## 2022-12-04 DIAGNOSIS — I10 Essential (primary) hypertension: Secondary | ICD-10-CM | POA: Diagnosis not present

## 2022-12-04 NOTE — Patient Instructions (Addendum)
You may restart aspirin '81mg'$  daily Continue your other medications.  Help finding a primary care provider within [place]. If you do not have access to a computer or the Internet you can call [phone] and  they will help you scheduled with a provider.  Or you can go to: [email or web address]      Mediterranean Diet A Mediterranean diet refers to food and lifestyle choices that are based on the traditions of countries located on the [employer]. It focuses on eating more fruits, vegetables, whole grains, beans, nuts, seeds, and heart-healthy fats, and eating less dairy, meat, eggs, and processed foods with added sugar, salt, and fat. This way of eating has been shown to help prevent certain conditions and improve outcomes for people who have chronic diseases, like kidney disease and heart disease. What are tips for following this plan? Reading food labels Check the serving size of packaged foods. For foods such as rice and pasta, the serving size refers to the amount of cooked product, not dry. Check the total fat in packaged foods. Avoid foods that have saturated fat or trans fats. Check the ingredient list for added sugars, such as corn syrup. Shopping  Buy a variety of foods that offer a balanced diet, including: Fresh fruits and vegetables (produce). Grains, beans, nuts, and seeds. Some of these may be available in unpackaged forms or large amounts (in bulk). Fresh seafood. Poultry and eggs. Low-fat dairy products. Buy whole ingredients instead of prepackaged foods. Buy fresh fruits and vegetables in-season from local farmers markets. Buy plain frozen fruits and vegetables. If you do not have access to quality fresh seafood, buy precooked frozen shrimp or canned fish, such as tuna, salmon, or sardines. Stock your pantry so you always have certain foods on hand, such as olive oil, canned tuna, canned tomatoes, rice, pasta, and beans. Cooking Cook foods with  extra-virgin olive oil instead of using butter or other vegetable oils. Have meat as a side dish, and have vegetables or grains as your main dish. This means having meat in small portions or adding small amounts of meat to foods like pasta or stew. Use beans or vegetables instead of meat in common dishes like chili or lasagna. Experiment with different cooking methods. Try roasting, broiling, steaming, and sauting vegetables. Add frozen vegetables to soups, stews, pasta, or rice. Add nuts or seeds for added healthy fats and plant protein at each meal. You can add these to yogurt, salads, or vegetable dishes. Marinate fish or vegetables using olive oil, lemon juice, garlic, and fresh herbs. Meal planning Plan to eat one vegetarian meal one day each week. Try to work up to two vegetarian meals, if possible. Eat seafood two or more times a week. Have healthy snacks readily available, such as: Vegetable sticks with hummus. Greek yogurt. Fruit and nut trail mix. Eat balanced meals throughout the week. This includes: Fruit: 2-3 servings a day. Vegetables: 4-5 servings a day. Low-fat dairy: 2 servings a day. Fish, poultry, or lean meat: 1 serving a day. Beans and legumes: 2 or more servings a week. Nuts and seeds: 1-2 servings a day. Whole grains: 6-8 servings a day. Extra-virgin olive oil: 3-4 servings a day. Limit red meat and sweets to only a few servings a month. Lifestyle  Cook and eat meals together with your family, when possible. Drink enough fluid to keep your urine pale yellow. Be physically active every day. This includes: Aerobic exercise like running or swimming. Leisure activities like gardening, walking, or  housework. Get 7-8 hours of sleep each night. If recommended by your health care provider, drink red wine in moderation. This means 1 glass a day for nonpregnant women and 2 glasses a day for men. A glass of wine equals 5 oz (150 mL). What foods should I  eat? Fruits Apples. Apricots. Avocado. Berries. Bananas. Cherries. Dates. Figs. Grapes. Lemons. Melon. Oranges. Peaches. Plums. Pomegranate. Vegetables Artichokes. Beets. Broccoli. Cabbage. Carrots. Eggplant. Green beans. Chard. Kale. Spinach. Onions. Leeks. Peas. Squash. Tomatoes. Peppers. Radishes. Grains Whole-grain pasta. Brown rice. Bulgur wheat. Polenta. Couscous. Whole-wheat bread. [place]. Meats and other proteins Beans. Almonds. Sunflower seeds. Pine nuts. Peanuts. [place]. Salmon. Scallops. Shrimp. [place]. Tilapia. Clams. Oysters. Eggs. Poultry without skin. Dairy Low-fat milk. Cheese. Greek yogurt. Fats and oils Extra-virgin olive oil. Avocado oil. Grapeseed oil. Beverages Water. Red wine. Herbal tea. Sweets and desserts Greek yogurt with honey. Baked apples. Poached pears. Trail mix. Seasonings and condiments Basil. Cilantro. Coriander. Cumin. Mint. Parsley. Sage. Rosemary. Tarragon. Garlic. Oregano. Thyme. Pepper. Balsamic vinegar. Tahini. Hummus. Tomato sauce. Olives. Mushrooms. The items listed above may not be a complete list of foods and beverages you can eat. Contact a dietitian for more information. What foods should I limit? This is a list of foods that should be eaten rarely or only on special occasions. Fruits Fruit canned in syrup. Vegetables Deep-fried potatoes (french fries). Grains Prepackaged pasta or rice dishes. Prepackaged cereal with added sugar. Prepackaged snacks with added sugar. Meats and other proteins Beef. Pork. Lamb. Poultry with skin. Hot dogs. Berniece Salines. Dairy Ice cream. Sour cream. Whole milk. Fats and oils Butter. Canola oil. Vegetable oil. Beef fat (tallow). Lard. Beverages Juice. Sugar-sweetened soft drinks. Beer. Liquor and spirits. Sweets and desserts Cookies. Cakes. Pies. Candy. Seasonings and condiments Mayonnaise. Pre-made sauces and marinades. The items listed above may not be a complete list of foods and beverages you should  limit. Contact a dietitian for more information. Summary The Mediterranean diet includes both food and lifestyle choices. Eat a variety of fresh fruits and vegetables, beans, nuts, seeds, and whole grains. Limit the amount of red meat and sweets that you eat. If recommended by your health care provider, drink red wine in moderation. This means 1 glass a day for nonpregnant women and 2 glasses a day for men. A glass of wine equals 5 oz (150 mL). This information is not intended to replace advice given to you by your health care provider. Make sure you discuss any questions you have with your health care provider. Document Revised: 11/21/2019 Document Reviewed: 09/18/2019 Elsevier Patient Education  [place].

## 2022-12-05 ENCOUNTER — Encounter: Payer: Self-pay | Admitting: Neurology

## 2023-01-01 ENCOUNTER — Encounter: Payer: Self-pay | Admitting: Podiatry

## 2023-01-01 ENCOUNTER — Ambulatory Visit: Payer: Medicare Other | Admitting: Podiatry

## 2023-01-01 VITALS — BP 129/56

## 2023-01-01 DIAGNOSIS — B351 Tinea unguium: Secondary | ICD-10-CM | POA: Diagnosis not present

## 2023-01-01 DIAGNOSIS — M79609 Pain in unspecified limb: Secondary | ICD-10-CM | POA: Diagnosis not present

## 2023-01-01 DIAGNOSIS — E119 Type 2 diabetes mellitus without complications: Secondary | ICD-10-CM | POA: Diagnosis not present

## 2023-01-01 DIAGNOSIS — N1831 Chronic kidney disease, stage 3a: Secondary | ICD-10-CM

## 2023-01-01 DIAGNOSIS — E1122 Type 2 diabetes mellitus with diabetic chronic kidney disease: Secondary | ICD-10-CM

## 2023-01-01 DIAGNOSIS — M205X2 Other deformities of toe(s) (acquired), left foot: Secondary | ICD-10-CM

## 2023-01-01 DIAGNOSIS — L84 Corns and callosities: Secondary | ICD-10-CM | POA: Diagnosis not present

## 2023-01-01 DIAGNOSIS — M205X1 Other deformities of toe(s) (acquired), right foot: Secondary | ICD-10-CM

## 2023-01-01 NOTE — Progress Notes (Signed)
ANNUAL DIABETIC FOOT EXAM  Subjective: [name] presents today for annual diabetic foot examination.  Chief Complaint  Patient presents with   Nail Problem    DFC BS-did not check today A1C-6.1 PCP-[place], Katharine Look PCP VST-3 weeks ago   Patient confirms h/o diabetes.  Patient relates 2 year h/o diabetes.  Patient denies any h/o foot wounds.  Patient denies any numbness, tingling, burning, or pins/needle sensation in feet.  Risk factors: diabetes, history of gout, h/o CVA, HTN, hyperlipidemia.  Tilda Franco, FNP is patient's PCP.   Past Medical History:  Diagnosis Date   Abdominal pain    Arthritis    cervical disc degeneration/ oa left knee, carpal tunnel rt wrist, adhesive capsulitis right shoulder, rt hand weakness; lumbar degeneration   Carpal tunnel syndrome    Complication of anesthesia 2006-at [facility]   breathing problems-no BP med given prior to surgery;  hx of being very sleepy after colon surgery --  states no problems with last right total knee replacement 2013   Constipation    Diabetes mellitus without complication ([place])    borderline - diet control   Diverticulitis hx of   Diverticulosis    E. coli infection    2020   Frequent UTI    hx of urethral injury during colon surgery - states frequent uti's since   GERD (gastroesophageal reflux disease)    H/O hiatal hernia    History of palpitations    in the past   History of shingles    has a lingering itching on back where shingles were   Hyperlipidemia    Hypertension    Numbness and tingling in right hand    pt. states has numbness of right hand very frequently-watch positioning   Obesity    Osteoarthritis    Osteoporosis    Pain    pain left knee and pain right hip and right groin   Personal history of colonic polyps-adenoma 08/26/2008   Pneumonia 2005   Shortness of breath 09/14/2021   Vitamin D deficiency    Patient Active Problem List   Diagnosis Date Noted   OSA on CPAP  10/19/2022   Hypokalemia 10/19/2022   Subacute intracranial hemorrhage ([place]) 10/18/2022   Type 2 diabetes mellitus with hypoglycemia ([place]) 10/18/2022   Transient speech disturbance 04/18/2022   Expressive aphasia    Stroke-like symptom    Shortness of breath 09/14/2021   Chronic kidney disease, stage 3a ([place]) 07/17/2021   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   Aortic atherosclerosis ([place]) 01/03/2021   Class 2 severe obesity due to excess calories with serious comorbidity and body mass index (BMI) of 39.0 to 39.9 in adult ([place]) 01/03/2021   Pruritus 01/03/2021   Vitamin D deficiency 06/01/2020   Pain in joint of right hip 05/11/2020   History of arthroplasty of right knee 05/11/2020   Abdominal bloating 02/17/2019   Urinary frequency 02/17/2019   Uncontrolled type 2 diabetes mellitus with hyperglycemia ([place]) 02/17/2019   Lightheaded 02/17/2019   Constipation 06/26/2018   Hyperlipidemia 08/23/2014   Type 2 diabetes mellitus with stage 3 chronic kidney disease, without long-term current use of insulin ([place]) 06/19/2014   Mixed hyperlipidemia 06/19/2014   S/P urological surgery 09/30/2013   Joint pain 09/30/2013   UTI (urinary tract infection) 09/30/2013   Cough 12/10/2012   OA (osteoarthritis) of knee 01/05/2012   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 08/02/2011   Vaginal discharge 08/02/2011   Spasm of lumbar paraspinous muscle 06/17/2011   Morbidly obese ([place])  05/31/2011   HIP PAIN, LEFT, CHRONIC 11/09/2010   CARPAL TUNNEL SYNDROME, RIGHT 10/14/2010   KNEE PAIN, RIGHT, CHRONIC 10/14/2010   LBBB (left bundle branch block) [id number]   HELICOBACTER PYLORI GASTRITIS 07/18/2010   FATIGUE 07/01/2010   CHEST PAIN, ATYPICAL 07/01/2010   EPIGASTRIC PAIN 07/01/2010   SUPRAPUBIC PAIN 07/01/2010   HYPERGLYCEMIA 07/01/2010   CELLULITIS AND ABSCESS OF OTHER SPECIFIED SITE 02/15/2010   LUMP OR MASS IN BREAST 01/10/2010   DYSURIA, CHRONIC 03/09/2009   GERD 10/06/2008   Personal  history of colonic polyps-adenoma 08/26/2008   LOC OSTEOARTHROS NOT SPEC PRIM/SEC LOWER LEG 09/10/2007   GOUT, UNSPECIFIED 09/09/2007   DIVERTICULOSIS, COLON 09/09/2007   DIVERTICULITIS, HX OF 09/09/2007   HYPERLIPIDEMIA 06/03/2007   Essential hypertension 06/03/2007   Past Surgical History:  Procedure Laterality Date   ABDOMINAL HYSTERECTOMY     bladder tack     BREAST BIOPSY Left    BREAST SURGERY     breast duct resection- benign   CARPAL TUNNEL RELEASE Right 06/04/2014   Procedure: RIGHT CARPAL TUNNEL RELEASE;  Surgeon: Roseanne Kaufman, MD;  Location: [facility];  Service: Orthopedics;  Laterality: Right;   COLON RESECTION  2008   hx diverticulosis   COLON SURGERY     COLONOSCOPY     colonoscopy [id number]     JOINT REPLACEMENT     OOPHORECTOMY     POLYPECTOMY     skin graft left arm - traumatic compression injury left upper arm     temporary ureter stent     TOTAL KNEE ARTHROPLASTY  01/05/2012   Procedure: TOTAL KNEE ARTHROPLASTY;  Surgeon: [name], MD;  Location: [facility];  Service: Orthopedics;  Laterality: Right;   TOTAL KNEE ARTHROPLASTY Left 08/17/2014   Procedure: LEFT TOTAL KNEE ARTHROPLASTY;  Surgeon: [name], MD;  Location: [facility];  Service: Orthopedics;  Laterality: Left;   ureter repair for tyransected left ureter     Current Outpatient Medications on File Prior to Visit  Medication Sig Dispense Refill   atenolol (TENORMIN) 25 MG tablet Take 25 mg by mouth daily.     atenolol-chlorthalidone (TENORETIC) 50-25 MG tablet Take 1/2 tablet by mouth daily 30 tablet 0   Blood Pressure KIT 1 Units by Does not apply route daily. Check blood pressure daily for hypertension dx code I10 1 kit 0   estradiol (ESTRACE) 0.1 MG/GM vaginal cream Insert blueberry size amount of cream on finger (or 0.5 grams with applicator) in vagina daily x2 week then 2x per week.     Lancets (ONETOUCH DELICA PLUS 123XX123) MISC USE TO CHECK BLOOD SUGAR TWICE DAILY  100 each 3   montelukast (SINGULAIR) 10 MG tablet Take 10 mg by mouth daily.     Multiple Vitamin (MULTIVITAMIN) tablet Take 1 tablet by mouth daily. 90 tablet 1   Olmesartan-amLODIPine-HCTZ 40-10-12.5 MG TABS Take 1 tablet by mouth daily.     ONETOUCH VERIO test strip USE TO TEST 2 TIMES DAILY 100 strip 3   solifenacin (VESICARE) 5 MG tablet Take 5 mg by mouth every morning.     torsemide (DEMADEX) 5 MG tablet Take 5 mg by mouth daily.     No current facility-administered medications on file prior to visit.    Allergies  Allergen Reactions   Bee Venom Hives   Latex Hives and Itching   Codeine Other (See Comments)    Hallucinations    Hydrocodone Other (See Comments)    hallucinations   Oxycodone  Other (See Comments)    hallucinations   Social History   Occupational History   Occupation: Games developer: [employer]  Tobacco Use   Smoking status: Never   Smokeless tobacco: Never  Vaping Use   Vaping Use: Never used  Substance and Sexual Activity   Alcohol use: No   Drug use: No   Sexual activity: Not Currently   Family History  Problem Relation Age of Onset   Breast cancer Mother 21   Hypertension Mother    Prostate cancer Father    Hypertension Father    Dementia Sister    Lung cancer Brother    Hypertension Brother    COPD Brother    Cancer Maternal Grandmother    Arthritis Sister    Hypertension Sister    Arthritis Sister    Hypertension Child    Hypertension Child    Hypertension Child    Colon polyps Neg Hx    Esophageal cancer Neg Hx    Rectal cancer Neg Hx    Stomach cancer Neg Hx    Colon cancer Neg Hx    Immunization History  Administered Date(s) Administered   Fluad Quad(high Dose 65+) 09/01/2020, 07/12/2021   Influenza Split 07/25/2012   Influenza, High Dose Seasonal PF 10/08/2018, 10/02/2019   Influenza,inj,Quad PF,6+ Mos 11/21/2013   Influenza-Unspecified 07/25/2012, 11/21/2013   Moderna Covid-19 Vaccine Bivalent Booster 34yr &  up 07/07/2021   Moderna SARS-COV2 Booster Vaccination 10/08/2020, 10/08/2020   Moderna Sars-Covid-2 Vaccination 12/03/2019, 01/01/2020   Pneumococcal Polysaccharide-23 11/21/2013   Pneumococcal-Unspecified 11/21/2013   Td 01/29/1999, 11/21/2013     Review of Systems: Negative except as noted in the HPI.   Objective: Vitals:   01/01/23 1043  BP: (!) 129/56    Cheryl RoseroBShutteris a pleasant 82y.o. female in NAD. AAO X 3.  Vascular Examination: Capillary refill time immediate b/l. Vascular status intact b/l with palpable pedal pulses. Pedal hair present b/l. No pain with calf compression b/l. Skin temperature gradient WNL b/l. Trace edema noted BLE. No cyanosis or clubbing noted b/l LE.  Neurological Examination: Sensation grossly intact b/l with 10 gram monofilament. Vibratory sensation intact b/l.   Dermatological Examination: Pedal skin with normal turgor, texture and tone b/l.  No open wounds. No interdigital macerations.   Toenails 1-5 b/l thick, discolored, elongated with subungual debris and pain on dorsal palpation.   Hyperkeratotic lesion(s) R 2nd toe and R 4th toe.  No erythema, no edema, no drainage, no fluctuance.  Musculoskeletal Examination: Muscle strength 5/5 to all lower extremity muscle groups bilaterally. Palpable exostosis noted dorsomedial midfoot joint of both feet. Adductovarus deformity 3-5 bilaterally.  Radiographs: None  Last A1c:      Latest Ref Rng & Units 04/18/2022    4:19 AM 03/01/2022   11:00 AM  Hemoglobin A1C  Hemoglobin-A1c 4.8 - 5.6 % 6.4  6.9    Footwear Assessment: Does the patient wear appropriate shoes? Yes. Does the patient need inserts/orthotics? No.  Lab Results  Component Value Date   HGBA1C 6.4 (H) 04/18/2022   ADA Risk Categorization: Low Risk :  Patient has all of the following: Intact protective sensation No prior foot ulcer  No severe deformity Pedal pulses present  Assessment: 1. Pain due to onychomycosis of  nail   2. Corns   3. 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 Acquired adductovarus rotation of toe of right foot   5. Type 2 diabetes mellitus with stage  3a chronic kidney disease, without long-term current use of insulin ([place])   6. Encounter for diabetic foot exam [facility])     Plan: -Patient was evaluated and treated. All patient's and/or POA's questions/concerns answered on today's visit. -Diabetic foot examination performed today. -Stressed the importance of good glycemic control and the detriment of not  controlling glucose levels in relation to the foot. -Continue diabetic foot care principles: inspect feet daily, monitor glucose as recommended by PCP and/or Endocrinologist, and follow prescribed diet per PCP, Endocrinologist and/or dietician. -Patient to continue soft, supportive shoe gear daily. -Toenails 1-5 b/l were debrided in length and girth with sterile nail nippers and dremel without iatrogenic bleeding.  -Corn(s) R 2nd toe and R 4th toe pared utilizing sharp debridement with sterile blade without complication or incident. Total number debrided=2. -Patient referred to Dr. Lanae Crumbly for evaluation of right foot pain. -Patient/POA to call should there be question/concern in the interim. Return in about 3 months (around 04/03/2023).  [name], DPM

## 2023-01-10 ENCOUNTER — Ambulatory Visit (INDEPENDENT_AMBULATORY_CARE_PROVIDER_SITE_OTHER): Payer: Medicare Other | Admitting: Podiatry

## 2023-01-10 DIAGNOSIS — Z91199 Patient's noncompliance with other medical treatment and regimen due to unspecified reason: Secondary | ICD-10-CM

## 2023-01-11 NOTE — Progress Notes (Signed)
Patient was no-show for appointment today 

## 2023-01-13 ENCOUNTER — Emergency Department (HOSPITAL_BASED_OUTPATIENT_CLINIC_OR_DEPARTMENT_OTHER): Payer: Medicare Other

## 2023-01-13 ENCOUNTER — Encounter (HOSPITAL_BASED_OUTPATIENT_CLINIC_OR_DEPARTMENT_OTHER): Payer: Self-pay | Admitting: Emergency Medicine

## 2023-01-13 ENCOUNTER — Emergency Department (HOSPITAL_BASED_OUTPATIENT_CLINIC_OR_DEPARTMENT_OTHER)
Admission: EM | Admit: 2023-01-13 | Discharge: 2023-01-13 | Disposition: A | Discharge status: Home / Self Care | Payer: Medicare Other | Attending: Emergency Medicine | Admitting: Emergency Medicine

## 2023-01-13 DIAGNOSIS — R1032 Left lower quadrant pain: Secondary | ICD-10-CM | POA: Insufficient documentation

## 2023-01-13 DIAGNOSIS — R109 Unspecified abdominal pain: Secondary | ICD-10-CM

## 2023-01-13 DIAGNOSIS — Z794 Long term (current) use of insulin: Secondary | ICD-10-CM | POA: Insufficient documentation

## 2023-01-13 DIAGNOSIS — E162 Hypoglycemia, unspecified: Secondary | ICD-10-CM | POA: Insufficient documentation

## 2023-01-13 DIAGNOSIS — Z79899 Other long term (current) drug therapy: Secondary | ICD-10-CM | POA: Insufficient documentation

## 2023-01-13 DIAGNOSIS — Z1152 Encounter for screening for COVID-19: Secondary | ICD-10-CM | POA: Insufficient documentation

## 2023-01-13 DIAGNOSIS — I10 Essential (primary) hypertension: Secondary | ICD-10-CM | POA: Insufficient documentation

## 2023-01-13 DIAGNOSIS — Z91040 Latex allergy status: Secondary | ICD-10-CM | POA: Insufficient documentation

## 2023-01-13 LAB — CBC WITH DIFFERENTIAL/PLATELET
Abs Immature Granulocytes: 0.07 10*3/uL (ref 0.00–0.07)
Basophils Absolute: 0.1 10*3/uL (ref 0.0–0.1)
Basophils Relative: 1 %
Eosinophils Absolute: 0.4 10*3/uL (ref 0.0–0.5)
Eosinophils Relative: 3 %
HCT: 41.2 % (ref 36.0–46.0)
Hemoglobin: 14 g/dL (ref 12.0–15.0)
Immature Granulocytes: 1 %
Lymphocytes Relative: 25 %
Lymphs Abs: 3 10*3/uL (ref 0.7–4.0)
MCH: 30 pg (ref 26.0–34.0)
MCHC: 34 g/dL (ref 30.0–36.0)
MCV: 88.2 fL (ref 80.0–100.0)
Monocytes Absolute: 0.9 10*3/uL (ref 0.1–1.0)
Monocytes Relative: 7 %
Neutro Abs: 7.7 10*3/uL (ref 1.7–7.7)
Neutrophils Relative %: 63 %
Platelets: 256 10*3/uL (ref 150–400)
RBC: 4.67 MIL/uL (ref 3.87–5.11)
RDW: 12.9 % (ref 11.5–15.5)
WBC: 12.1 10*3/uL — ABNORMAL HIGH (ref 4.0–10.5)
nRBC: 0 % (ref 0.0–0.2)

## 2023-01-13 LAB — RESP PANEL BY RT-PCR (RSV, FLU A&B, COVID)  RVPGX2
Influenza A by PCR: NEGATIVE
Influenza B by PCR: NEGATIVE
Resp Syncytial Virus by PCR: NEGATIVE
SARS Coronavirus 2 by RT PCR: NEGATIVE

## 2023-01-13 LAB — URINALYSIS, W/ REFLEX TO CULTURE (INFECTION SUSPECTED)
Bacteria, UA: NONE SEEN
Bilirubin Urine: NEGATIVE
Glucose, UA: NEGATIVE mg/dL
Hgb urine dipstick: NEGATIVE
Ketones, ur: NEGATIVE mg/dL
Leukocytes,Ua: NEGATIVE
Nitrite: NEGATIVE
Protein, ur: NEGATIVE mg/dL
Specific Gravity, Urine: 1.006 (ref 1.005–1.030)
pH: 5 (ref 5.0–8.0)

## 2023-01-13 LAB — COMPREHENSIVE METABOLIC PANEL
ALT: 18 U/L (ref 0–44)
AST: 20 U/L (ref 15–41)
Albumin: 3.6 g/dL (ref 3.5–5.0)
Alkaline Phosphatase: 51 U/L (ref 38–126)
Anion gap: 9 (ref 5–15)
BUN: 25 mg/dL — ABNORMAL HIGH (ref 8–23)
CO2: 28 mmol/L (ref 22–32)
Calcium: 10 mg/dL (ref 8.9–10.3)
Chloride: 103 mmol/L (ref 98–111)
Creatinine, Ser: 1.06 mg/dL — ABNORMAL HIGH (ref 0.44–1.00)
GFR, Estimated: 53 mL/min — ABNORMAL LOW (ref >60)
Glucose, Bld: 59 mg/dL — ABNORMAL LOW (ref 70–99)
Potassium: 3.7 mmol/L (ref 3.5–5.1)
Sodium: 140 mmol/L (ref 135–145)
Total Bilirubin: 0.8 mg/dL (ref 0.3–1.2)
Total Protein: 7.1 g/dL (ref 6.5–8.1)

## 2023-01-13 LAB — CBG MONITORING, ED
Glucose-Capillary: 65 mg/dL — ABNORMAL LOW (ref 70–99)
Glucose-Capillary: 119 mg/dL — ABNORMAL HIGH (ref 70–99)

## 2023-01-13 LAB — LIPASE, BLOOD: Lipase: <10 U/L — ABNORMAL LOW (ref 11–51)

## 2023-01-13 MED ORDER — ACETAMINOPHEN 325 MG PO TABS
650.0000 mg | ORAL_TABLET | Freq: Once | ORAL | Status: AC
  Administered 2023-01-13: 650 mg via ORAL
  Filled 2023-01-13: qty 2

## 2023-01-13 NOTE — ED Triage Notes (Signed)
Pt arrived POV, ambulatory, caox4. Pt c/o L flank pain, polyuria, productive cough, and congestion x2 weeks. Pt denies pain at present. Pt denies N/V/D, fever.  Hx recurring UTI, last was approx 2 months ago, not currently on abx

## 2023-01-13 NOTE — ED Notes (Signed)
Pt given apple juice and crackers.  

## 2023-01-13 NOTE — Discharge Instructions (Addendum)
Make sure you eat a good full meal when you get home.  Continue to check your blood sugar tonight and make sure your blood sugar is not below 70.  Follow-up with primary care doctor.  At this time I recommend that you stop any diabetes medications and continue to log your blood sugar at home.  If you are having consistent blood sugars above 200 then you can restart this medication but I prefer that you follow-up with your primary care doctor first.

## 2023-01-13 NOTE — ED Provider Notes (Addendum)
[facility] Provider Note   CSN: [id number] Arrival date & time: 01/13/23  1255     History  Chief Complaint  Patient presents with   Flank Pain    [name] is a 82 y.o. female.  Patient here with left lower abdominal pain/flank pain.  Some urinary frequency.  History of UTIs.  History of high cholesterol, hypertension, diverticulitis.  Denies any nausea vomiting diarrhea.  The history is provided by the patient.       Home Medications Prior to Admission medications   Medication Sig Start Date End Date Taking? Authorizing Provider  atenolol (TENORMIN) 25 MG tablet Take 25 mg by mouth daily. 12/02/22   [provider]  atenolol-chlorthalidone (TENORETIC) 50-25 MG tablet Take 1/2 tablet by mouth daily 10/20/22   Caren Griffins, MD  Blood Pressure KIT 1 Units by Does not apply route daily. Check blood pressure daily for hypertension dx code I10 09/14/21   Skeet Latch, MD  estradiol (ESTRACE) 0.1 MG/GM vaginal cream Insert blueberry size amount of cream on finger (or 0.5 grams with applicator) in vagina daily x2 week then 2x per week. 09/14/22   [provider]  Lancets (ONETOUCH DELICA PLUS 123XX123) [place] USE TO CHECK BLOOD SUGAR TWICE DAILY 12/24/20   [name], MD  montelukast (SINGULAIR) 10 MG tablet Take 10 mg by mouth daily. 05/16/22   [provider]  Multiple Vitamin (MULTIVITAMIN) tablet Take 1 tablet by mouth daily. 08/18/21   [name], MD  Olmesartan-amLODIPine-HCTZ 40-10-12.5 MG TABS Take 1 tablet by mouth daily. 12/02/22   [provider]  [name] test strip USE TO TEST 2 TIMES DAILY 12/20/20   [name], MD  solifenacin (VESICARE) 5 MG tablet Take 5 mg by mouth every morning. 02/09/22   [provider]  torsemide (DEMADEX) 5 MG tablet Take 5 mg by mouth daily. 12/02/22   [provider]      Allergies    Bee venom, Latex, Codeine,  Hydrocodone, and Oxycodone    Review of Systems   Review of Systems  Physical Exam Updated Vital Signs BP (!) 141/56   Pulse (!) 51   Temp 98.2 F (36.8 C) (Oral)   Resp 19   SpO2 99%  Physical Exam Vitals and nursing note reviewed.  Constitutional:      General: She is not in acute distress.    Appearance: She is well-developed. She is not ill-appearing.  HENT:     Head: Normocephalic and atraumatic.     Nose: Nose normal.     Mouth/Throat:     Mouth: Mucous membranes are moist.  Eyes:     Extraocular Movements: Extraocular movements intact.     Conjunctiva/sclera: Conjunctivae normal.     Pupils: Pupils are equal, round, and reactive to light.  Cardiovascular:     Rate and Rhythm: Normal rate and regular rhythm.     Heart sounds: No murmur heard. Pulmonary:     Effort: Pulmonary effort is normal. No respiratory distress.     Breath sounds: Normal breath sounds.  Abdominal:     Palpations: Abdomen is soft.     Tenderness: There is abdominal tenderness.  Musculoskeletal:        General: No swelling.     Cervical back: Normal range of motion and neck supple.  Skin:    General: Skin is warm and dry.     Capillary Refill: Capillary refill takes less than 2 seconds.  Neurological:     Mental Status: She is alert.  Psychiatric:        Mood and Affect: Mood normal.     ED Results / Procedures / Treatments   Labs (all labs ordered are listed, but only abnormal results are displayed) Labs Reviewed  CBC WITH DIFFERENTIAL/PLATELET - Abnormal; Notable for the following components:      Result Value   WBC 12.1 (*)    All other components within normal limits  COMPREHENSIVE METABOLIC PANEL - Abnormal; Notable for the following components:   Glucose, Bld 59 (*)    BUN 25 (*)    Creatinine, Ser 1.06 (*)    GFR, Estimated 53 (*)    All other components within normal limits  URINALYSIS, W/ REFLEX TO CULTURE (INFECTION SUSPECTED) - Abnormal; Notable for the following  components:   Color, Urine COLORLESS (*)    All other components within normal limits  LIPASE, BLOOD - Abnormal; Notable for the following components:   Lipase <10 (*)    All other components within normal limits  RESP PANEL BY RT-PCR (RSV, FLU A&B, COVID)  [id number]    EKG EKG Interpretation  Date/Time:  Saturday January 13 2023 15:15:00 EDT Ventricular Rate:  51 PR Interval:  166 QRS Duration: 165 QT Interval:  472 QTC Calculation: 435 R Axis:   -18 Text Interpretation: Sinus rhythm Left bundle branch block Confirmed by Lennice Sites ([id number]) on 01/13/2023 3:18:07 PM  Radiology CT Renal Stone Study  Result Date: 01/13/2023 CLINICAL DATA:  Abdominal and left flank pain. Kidney stones suspected. EXAM: CT ABDOMEN AND PELVIS WITHOUT CONTRAST TECHNIQUE: Multidetector CT imaging of the abdomen and pelvis was performed following the standard protocol without IV contrast. RADIATION DOSE REDUCTION: This exam was performed according to the departmental dose-optimization program which includes automated exposure control, adjustment of the mA and/or kV according to patient size and/or use of iterative reconstruction technique. COMPARISON:  01/24/2022 FINDINGS: Lower chest: Unremarkable. Hepatobiliary: No suspicious focal abnormality in the liver on this study without intravenous contrast. Calcified gallstones evident. No CT features of gallbladder wall thickening or pericholecystic fluid. No intrahepatic or extrahepatic biliary dilation. Pancreas: No focal mass lesion. No dilatation of the main duct. No intraparenchymal cyst. No peripancreatic edema. Spleen: No splenomegaly. No focal mass lesion. Adrenals/Urinary Tract: No adrenal nodule or mass. Kidneys unremarkable. No evidence for hydroureter. Left ureter inserts anterior bladder dome suggesting prior reimplantation the urinary bladder appears normal for the degree of distention. Stomach/Bowel: Stomach is unremarkable. No gastric wall thickening. No  evidence of outlet obstruction. Duodenum is normally positioned as is the ligament of Treitz. No small bowel wall thickening. No small bowel dilatation. The terminal ileum is normal. The appendix is normal. No gross colonic mass. No colonic wall thickening. Diverticular changes are noted in the left colon without evidence of diverticulitis. Rectal anastomosis noted. Vascular/Lymphatic: There is mild atherosclerotic calcification of the abdominal aorta without aneurysm. 14 mm short axis portal caval node on 22/2 is stable in the interval, likely reactive. No retroperitoneal or mesenteric lymphadenopathy. No pelvic sidewall lymphadenopathy. Reproductive: Hysterectomy.  There is no adnexal mass. Other: No intraperitoneal free fluid. Musculoskeletal: Complex supraumbilical midline ventral hernia identified with multiple discrete fascial defects evident with fat containing hernia sacs associated. No worrisome lytic or sclerotic osseous abnormality. IMPRESSION: 1. No acute findings in the abdomen or pelvis. Specifically, no findings to explain the patient's history of left flank pain. 2. Cholelithiasis. 3. Left colonic diverticulosis without diverticulitis. 4. Complex midline ventral  hernia with multiple discrete fascial defects evident with fat containing hernia sacs associated. 5.  Aortic Atherosclerosis (ICD10-I70.0). Electronically Signed   By: Misty Stanley M.D.   On: 01/13/2023 16:44    Procedures Procedures    Medications Ordered in ED Medications  acetaminophen (TYLENOL) tablet 650 mg (650 mg Oral Given 01/13/23 1622)    ED Course/ Medical Decision Making/ A&P                             Medical Decision Making Amount and/or Complexity of Data Reviewed Radiology: ordered.  Risk OTC drugs.   [name] is here with left-sided abdominal pain.  History of diverticulitis, hypertension, high cholesterol.  Normal vitals.  No fever.  Differential diagnosis likely UTI versus diverticulitis  versus less likely bowel obstruction/kidney stone.  Will get CBC, CMP, lipase, urinalysis, CT scan of abdomen and pelvis.  EKG shows sinus rhythm.  No ischemic changes.  I have no concern for ACS.  Blood sugar came back at 59.  She has not had anything to eat today.  This improved after eating and drinking.  Lab work otherwise unremarkable.  CT scan with no acute findings per radiology report.  I reviewed interpreted labs and imaging.  Patient feeling much better after having something to eat.  Discharged in good condition.  Understands return precautions.  Recommend that she eat a good meal when she gets home and seek continue to check her blood sugars at home.  She does not take insulin.  I will have her hold her oral diabetes medications.  Seems like she has had some low blood sugars in the past.  Think that this medication may need to be looked at again.  This chart was dictated using voice recognition software.  Despite best efforts to proofread,  errors can occur which can change the documentation meaning.         Final Clinical Impression(s) / ED Diagnoses Final diagnoses:  Hypoglycemia  Abdominal pain, unspecified abdominal location    Rx / DC Orders ED Discharge Orders     None         Lennice Sites, DO 01/13/23 [place], [place], DO 01/13/23 1730

## 2023-01-13 NOTE — ED Notes (Signed)
Pt currently unable to provide urine sample. Pt instructed to press call button when she feels she is able to go.

## 2023-02-06 ENCOUNTER — Other Ambulatory Visit: Payer: Self-pay | Admitting: Family Medicine

## 2023-02-06 ENCOUNTER — Telehealth: Payer: Self-pay

## 2023-02-06 DIAGNOSIS — Z1231 Encounter for screening mammogram for malignant neoplasm of breast: Secondary | ICD-10-CM

## 2023-02-06 DIAGNOSIS — I612 Nontraumatic intracerebral hemorrhage in hemisphere, unspecified: Secondary | ICD-10-CM

## 2023-02-06 NOTE — Telephone Encounter (Signed)
How frequent or the headaches (on average, how many days a week/month are they occurring)?  Left side pain for the last two days How long do the headaches last?  2days Verify what preventative medication and dose you are taking (e.g. topiramate, propranolol, amitriptyline, Emgality, etc)  NO Verify which rescue medication you are taking (triptan, Advil, Excedrin, Aleve, Ubrelvy, etc) NONE How often are you taking pain relievers/analgesics/rescue mediction?  NONE

## 2023-02-06 NOTE — Telephone Encounter (Signed)
Patient states she is having issues with headaches again, the feeling is someone sticking pins and needles in her head. Wondering what she can do to help the pain.

## 2023-02-06 NOTE — Telephone Encounter (Signed)
Please see if this is similar to her prior headaches.  If so, I recommend CT head wo contrast given her history of intracranial hemorrhage.  If she has any associated neurological symptoms (vision changes, numbness/tingling, dizziness, or arm/leg weakness), recommend going to the ER.  Otherwise, we can get a CT out-patient ASAP.

## 2023-02-06 NOTE — Telephone Encounter (Signed)
Patient advised of Dr.Patel note.   CT of head ordered.

## 2023-02-11 ENCOUNTER — Other Ambulatory Visit: Payer: Self-pay

## 2023-02-11 ENCOUNTER — Emergency Department (HOSPITAL_COMMUNITY): Payer: Medicare Other

## 2023-02-11 ENCOUNTER — Encounter (HOSPITAL_COMMUNITY): Payer: Self-pay | Admitting: Emergency Medicine

## 2023-02-11 ENCOUNTER — Observation Stay (HOSPITAL_COMMUNITY)
Admission: EM | Admit: 2023-02-11 | Discharge: 2023-02-12 | Disposition: A | Discharge status: Home / Self Care | Payer: Medicare Other | Attending: Internal Medicine | Admitting: Internal Medicine

## 2023-02-11 DIAGNOSIS — G459 Transient cerebral ischemic attack, unspecified: Principal | ICD-10-CM | POA: Insufficient documentation

## 2023-02-11 DIAGNOSIS — N179 Acute kidney failure, unspecified: Secondary | ICD-10-CM | POA: Insufficient documentation

## 2023-02-11 DIAGNOSIS — Z91040 Latex allergy status: Secondary | ICD-10-CM | POA: Insufficient documentation

## 2023-02-11 DIAGNOSIS — E1122 Type 2 diabetes mellitus with diabetic chronic kidney disease: Secondary | ICD-10-CM | POA: Insufficient documentation

## 2023-02-11 DIAGNOSIS — Z8673 Personal history of transient ischemic attack (TIA), and cerebral infarction without residual deficits: Secondary | ICD-10-CM | POA: Diagnosis present

## 2023-02-11 DIAGNOSIS — Z7982 Long term (current) use of aspirin: Secondary | ICD-10-CM | POA: Insufficient documentation

## 2023-02-11 DIAGNOSIS — N183 Chronic kidney disease, stage 3 unspecified: Secondary | ICD-10-CM | POA: Insufficient documentation

## 2023-02-11 DIAGNOSIS — R4182 Altered mental status, unspecified: Secondary | ICD-10-CM | POA: Diagnosis not present

## 2023-02-11 DIAGNOSIS — I152 Hypertension secondary to endocrine disorders: Secondary | ICD-10-CM | POA: Diagnosis present

## 2023-02-11 DIAGNOSIS — Z96653 Presence of artificial knee joint, bilateral: Secondary | ICD-10-CM | POA: Diagnosis not present

## 2023-02-11 DIAGNOSIS — Z7984 Long term (current) use of oral hypoglycemic drugs: Secondary | ICD-10-CM | POA: Diagnosis not present

## 2023-02-11 DIAGNOSIS — R200 Anesthesia of skin: Secondary | ICD-10-CM | POA: Diagnosis present

## 2023-02-11 DIAGNOSIS — I10 Essential (primary) hypertension: Secondary | ICD-10-CM | POA: Diagnosis present

## 2023-02-11 DIAGNOSIS — I129 Hypertensive chronic kidney disease with stage 1 through stage 4 chronic kidney disease, or unspecified chronic kidney disease: Secondary | ICD-10-CM | POA: Diagnosis not present

## 2023-02-11 DIAGNOSIS — E1169 Type 2 diabetes mellitus with other specified complication: Secondary | ICD-10-CM | POA: Diagnosis present

## 2023-02-11 LAB — COMPREHENSIVE METABOLIC PANEL
ALT: 19 U/L (ref 0–44)
AST: 24 U/L (ref 15–41)
Albumin: 3.5 g/dL (ref 3.5–5.0)
Alkaline Phosphatase: 51 U/L (ref 38–126)
Anion gap: 13 (ref 5–15)
BUN: 35 mg/dL — ABNORMAL HIGH (ref 8–23)
CO2: 23 mmol/L (ref 22–32)
Calcium: 9.2 mg/dL (ref 8.9–10.3)
Chloride: 101 mmol/L (ref 98–111)
Creatinine, Ser: 1.6 mg/dL — ABNORMAL HIGH (ref 0.44–1.00)
GFR, Estimated: 32 mL/min — ABNORMAL LOW (ref >60)
Glucose, Bld: 123 mg/dL — ABNORMAL HIGH (ref 70–99)
Potassium: 4.2 mmol/L (ref 3.5–5.1)
Sodium: 137 mmol/L (ref 135–145)
Total Bilirubin: 0.7 mg/dL (ref 0.3–1.2)
Total Protein: 6.8 g/dL (ref 6.5–8.1)

## 2023-02-11 LAB — CBC
HCT: 41.3 % (ref 36.0–46.0)
Hemoglobin: 14.1 g/dL (ref 12.0–15.0)
MCH: 30.6 pg (ref 26.0–34.0)
MCHC: 34.1 g/dL (ref 30.0–36.0)
MCV: 89.6 fL (ref 80.0–100.0)
Platelets: 250 10*3/uL (ref 150–400)
RBC: 4.61 MIL/uL (ref 3.87–5.11)
RDW: 13 % (ref 11.5–15.5)
WBC: 10.3 10*3/uL (ref 4.0–10.5)
nRBC: 0 % (ref 0.0–0.2)

## 2023-02-11 LAB — DIFFERENTIAL
Abs Immature Granulocytes: 0.12 10*3/uL — ABNORMAL HIGH (ref 0.00–0.07)
Basophils Absolute: 0.1 10*3/uL (ref 0.0–0.1)
Basophils Relative: 1 %
Eosinophils Absolute: 0.4 10*3/uL (ref 0.0–0.5)
Eosinophils Relative: 4 %
Immature Granulocytes: 1 %
Lymphocytes Relative: 30 %
Lymphs Abs: 3.1 10*3/uL (ref 0.7–4.0)
Monocytes Absolute: 0.8 10*3/uL (ref 0.1–1.0)
Monocytes Relative: 8 %
Neutro Abs: 5.8 10*3/uL (ref 1.7–7.7)
Neutrophils Relative %: 56 %

## 2023-02-11 LAB — I-STAT CHEM 8, ED
BUN: 37 mg/dL — ABNORMAL HIGH (ref 8–23)
Calcium, Ion: 1.1 mmol/L — ABNORMAL LOW (ref 1.15–1.40)
Chloride: 106 mmol/L (ref 98–111)
Creatinine, Ser: 1.7 mg/dL — ABNORMAL HIGH (ref 0.44–1.00)
Glucose, Bld: 119 mg/dL — ABNORMAL HIGH (ref 70–99)
HCT: 41 % (ref 36.0–46.0)
Hemoglobin: 13.9 g/dL (ref 12.0–15.0)
Potassium: 4.1 mmol/L (ref 3.5–5.1)
Sodium: 138 mmol/L (ref 135–145)
TCO2: 26 mmol/L (ref 22–32)

## 2023-02-11 LAB — CBG MONITORING, ED: Glucose-Capillary: 109 mg/dL — ABNORMAL HIGH (ref 70–99)

## 2023-02-11 LAB — PROTIME-INR
INR: 1 (ref 0.8–1.2)
Prothrombin Time: 13.3 seconds (ref 11.4–15.2)

## 2023-02-11 LAB — ETHANOL: Alcohol, Ethyl (B): <10 mg/dL (ref <10)

## 2023-02-11 LAB — APTT: aPTT: 31 seconds (ref 24–36)

## 2023-02-11 MED ORDER — SODIUM CHLORIDE 0.9% FLUSH: 3.0000 mL | Freq: Once | INTRAVENOUS | Status: DC

## 2023-02-11 NOTE — Assessment & Plan Note (Addendum)
Looks like this is just diet controlled. CBG checks while here

## 2023-02-11 NOTE — Code Documentation (Addendum)
Responded to Code Stroke called at 2048 on pt in triage. Code Stroke called for L hand numbness, [id number]. CBG-119, NIH-0, CT head negative for acute changes. TNK not given-symptoms resolved. Pt placed on TIA. Plan MRI/stroke work-up.

## 2023-02-11 NOTE — ED Provider Notes (Signed)
[place] EMERGENCY DEPARTMENT AT [facility] Provider Note   CSN: [id number] Arrival date & time: 02/11/23  2025  An emergency department physician performed an initial assessment on this suspected stroke patient at 2050.  History  Chief Complaint  Patient presents with   Numbness    ANNAMARY LOEBIG is a 82 y.o. female.  82 year old female presents from home with sudden onset of right hand numbness and weakness which began yesterday.  It resolved spontaneously.  Today patient had sudden onset of left-sided facial numbness with left lip swelling and subjective facial droop.  Also had left arm weakness as well.  Symptoms have slowly resolved.  Was called code stroke in triage and placed in a room.  Patient states that she has no headache.  Has not had any vomiting.  No chest or abdominal discomfort.  No fever or chills       Home Medications Prior to Admission medications   Medication Sig Start Date End Date Taking? Authorizing Provider  atenolol (TENORMIN) 25 MG tablet Take 25 mg by mouth daily. 12/02/22   [provider]  atenolol-chlorthalidone (TENORETIC) 50-25 MG tablet Take 1/2 tablet by mouth daily 10/20/22   Leatha Gilding, MD  Blood Pressure KIT 1 Units by Does not apply route daily. Check blood pressure daily for hypertension dx code I10 09/14/21   Chilton Si, MD  estradiol (ESTRACE) 0.1 MG/GM vaginal cream Insert blueberry size amount of cream on finger (or 0.5 grams with applicator) in vagina daily x2 week then 2x per week. 09/14/22   [provider]  Lancets (ONETOUCH DELICA PLUS LANCET33G) MISC USE TO CHECK BLOOD SUGAR TWICE DAILY 12/24/20   Dorothyann Peng, MD  montelukast (SINGULAIR) 10 MG tablet Take 10 mg by mouth daily. 05/16/22   [provider]  Multiple Vitamin (MULTIVITAMIN) tablet Take 1 tablet by mouth daily. 08/18/21   Dorothyann Peng, MD  Olmesartan-amLODIPine-HCTZ 40-10-12.5 MG TABS Take 1 tablet by mouth daily.  12/02/22   [provider]  Letta Pate VERIO test strip USE TO TEST 2 TIMES DAILY 12/20/20   Dorothyann Peng, MD  solifenacin (VESICARE) 5 MG tablet Take 5 mg by mouth every morning. 02/09/22   [provider]  torsemide (DEMADEX) 5 MG tablet Take 5 mg by mouth daily. 12/02/22   [provider]      Allergies    Bee venom, Latex, Codeine, Hydrocodone, and Oxycodone    Review of Systems   Review of Systems  All other systems reviewed and are negative.   Physical Exam Updated Vital Signs BP (!) 154/64 (BP Location: Right Arm)   Pulse 71   Temp 98 F (36.7 C)   Resp 19   Ht 1.676 m (5\' 6" )   Wt 103.9 kg   SpO2 96%   BMI 36.96 kg/m  Physical Exam Vitals and nursing note reviewed.  Constitutional:      General: She is not in acute distress.    Appearance: Normal appearance. She is well-developed. She is not toxic-appearing.  HENT:     Head: Normocephalic and atraumatic.  Eyes:     General: Lids are normal.     Conjunctiva/sclera: Conjunctivae normal.     Pupils: Pupils are equal, round, and reactive to light.  Neck:     Thyroid: No thyroid mass.     Trachea: No tracheal deviation.  Cardiovascular:     Rate and Rhythm: Normal rate and regular rhythm.     Heart sounds: Normal heart sounds.  No murmur heard.    No gallop.  Pulmonary:     Effort: Pulmonary effort is normal. No respiratory distress.     Breath sounds: Normal breath sounds. No stridor. No decreased breath sounds, wheezing, rhonchi or rales.  Abdominal:     General: There is no distension.     Palpations: Abdomen is soft.     Tenderness: There is no abdominal tenderness. There is no rebound.  Musculoskeletal:        General: No tenderness. Normal range of motion.     Cervical back: Normal range of motion and neck supple.  Skin:    General: Skin is warm and dry.     Findings: No abrasion or rash.  Neurological:     Mental Status: She is alert and oriented to person, place, and time.  Mental status is at baseline.     GCS: GCS eye subscore is 4. GCS verbal subscore is 5. GCS motor subscore is 6.     Cranial Nerves: Cranial nerves 2-12 are intact. No cranial nerve deficit.     Sensory: No sensory deficit.     Motor: Motor function is intact.     Comments: Strength is 5 of 5 bilateral upper extremities  Psychiatric:        Attention and Perception: Attention normal.        Speech: Speech normal.        Behavior: Behavior normal.     ED Results / Procedures / Treatments   Labs (all labs ordered are listed, but only abnormal results are displayed) Labs Reviewed  DIFFERENTIAL - Abnormal; Notable for the following components:      Result Value   Abs Immature Granulocytes 0.12 (*)    All other components within normal limits  COMPREHENSIVE METABOLIC PANEL - Abnormal; Notable for the following components:   Glucose, Bld 123 (*)    BUN 35 (*)    Creatinine, Ser 1.60 (*)    GFR, Estimated 32 (*)    All other components within normal limits  I-STAT CHEM 8, ED - Abnormal; Notable for the following components:   BUN 37 (*)    Creatinine, Ser 1.70 (*)    Glucose, Bld 119 (*)    Calcium, Ion 1.10 (*)    All other components within normal limits  PROTIME-INR  APTT  CBC  ETHANOL  CBG MONITORING, ED    EKG EKG Interpretation  Date/Time:  Sunday February 11 2023 21:09:37 EDT Ventricular Rate:  70 PR Interval:  155 QRS Duration: 161 QT Interval:  451 QTC Calculation: 487 R Axis:   11 Text Interpretation: Sinus rhythm Left bundle branch block No significant change since last tracing Confirmed by Lorre Nick ([id number]) on 02/11/2023 9:20:22 PM  Radiology CT HEAD CODE STROKE WO CONTRAST  Result Date: 02/11/2023 CLINICAL DATA:  Code stroke. EXAM: CT HEAD WITHOUT CONTRAST TECHNIQUE: Contiguous axial images were obtained from the base of the skull through the vertex without intravenous contrast. RADIATION DOSE REDUCTION: This exam was performed according to the  departmental dose-optimization program which includes automated exposure control, adjustment of the mA and/or kV according to patient size and/or use of iterative reconstruction technique. COMPARISON:  Prior study from 10/20/2022 and earlier. FINDINGS: Brain: Cerebral volume within normal limits for age. Moderate chronic microvascular ischemic changes noted. No acute intracranial hemorrhage. No acute large vessel territory infarct. No mass lesion or midline shift. No hydrocephalus or extra-axial fluid collection. Vascular: No abnormal hyperdense vessel. Scattered calcified atherosclerosis present at  the skull base. Skull: Scalp soft tissues and calvarium within normal limits. Sinuses/Orbits: Globes and orbital soft tissues demonstrate no acute finding. Paranasal sinuses are clear. No mastoid effusion. Other: None. ASPECTS [facility] Stroke Program Early CT Score) - Ganglionic level infarction (caudate, lentiform nuclei, internal capsule, insula, M1-M3 cortex): 7 - Supraganglionic infarction (M4-M6 cortex): 3 Total score (0-10 with 10 being normal): 10 IMPRESSION: 1. No acute intracranial abnormality. 2. ASPECTS is 10. 3. Moderately advanced chronic microvascular ischemic disease. These results were communicated to Dr. Derry Lory at 9:01 pm on 02/11/2023 by text page via the [facility] messaging system. Electronically Signed   By: Rise Mu M.D.   On: 02/11/2023 21:02    Procedures Procedures    Medications Ordered in ED Medications  sodium chloride flush (NS) 0.9 % injection 3 mL (0 mLs Intravenous Hold 02/11/23 2054)    ED Course/ Medical Decision Making/ A&P                             Medical Decision Making Amount and/or Complexity of Data Reviewed Labs: ordered. Radiology: ordered.  Called code stroke in the department. Patient is EKG per my interpretation shows normal sinus rhythm with left bundle branch block which is unchanged from prior.  Patient's electrolytes significant for an  increase in her creatinine.  Could be from dehydration versus acute kidney injury.  Will be given IV fluids for this.  CT of head per interpretation review showed no acute findings.  Discussed with neurologist on-call who thinks patient likely had a TIA and request medicine admission for the same.  Will consult hospitalist team for admission.  Family at bedside notified  CRITICAL CARE Performed by: Toy Baker Total critical care time: 45 minutes Critical care time was exclusive of separately billable procedures and treating other patients. Critical care was necessary to treat or prevent imminent or life-threatening deterioration. Critical care was time spent personally by me on the following activities: development of treatment plan with patient and/or surrogate as well as nursing, discussions with consultants, evaluation of patient's response to treatment, examination of patient, obtaining history from patient or surrogate, ordering and performing treatments and interventions, ordering and review of laboratory studies, ordering and review of radiographic studies, pulse oximetry and re-evaluation of patient's condition.         Final Clinical Impression(s) / ED Diagnoses Final diagnoses:  None    Rx / DC Orders ED Discharge Orders     None         Lorre Nick, MD 02/11/23 2121

## 2023-02-11 NOTE — ED Triage Notes (Addendum)
Pt arrives via POV from home, sudden onset of facial numbness, lip swelling, possible left facial droop, left arm weakness bilateral hand tingling. No arm drift, unilateral weakness noted. LSN 5pm per husband. Awake, alert, appropriate at present.

## 2023-02-11 NOTE — Assessment & Plan Note (Signed)
>>  ASSESSMENT AND PLAN FOR TYPE 2 DIABETES MELLITUS WITH STAGE 3 CHRONIC KIDNEY DISEASE, WITHOUT LONG-TERM CURRENT USE OF INSULIN (HCC) WRITTEN ON 02/12/2023 12:21 AM BY GARDNER, JARED M, DO  Hold glipizide CBG checks while here

## 2023-02-11 NOTE — Consult Note (Signed)
NEUROLOGY CONSULTATION NOTE   Date of service: February 11, 2023 Patient Name: [name] MRN:  [id number] DOB:  Nov 26, 1940 Reason for consult: "L sided numbness" Requesting Provider: Lorre Nick, MD _ _ _   _ __   _ __ _ _  __ __   _ __   __ _  History of Present Illness  [name] is a 82 y.o. female with PMH significant for diabetes, hypertension, hyperlipidemia, obesity, osteoarthritis, hiatal hernia, presents with episode of left upper extremity numbness. Patient reports that she went to bed last night and woke up this morning and noted that her left knee was.  She tried to shake it out but was still persistent.  He went to church and going back and her symptoms had resolved.  In the afternoon, she took a nap in her recliner at 1630 on 02/17/2023.  When she got up from her nap, she noted that her left hand was numb again.  This time, she also felt that the left side of her face was numb and her tongue was really heavy.  Left hand numbness resolved in about 10 minutes but the left facial numbness and the tongue heaviness is improved but not completely resolved.  She and husband decided to come to the ED.  LKW: 1630 on 02/11/2023. mRS: 0 tNKASE: Not offered is too mild to treat.  And she is outside the window. Thrombectomy: Not offered as she is too mild to treat.  NIHSS components Score: Comment  1a Level of Conscious 0[x]  1[]  2[]  3[]      1b LOC Questions 0[x]  1[]  2[]       1c LOC Commands 0[x]  1[]  2[]       2 Best Gaze 0[x]  1[]  2[]       3 Visual 0[x]  1[]  2[]  3[]      4 Facial Palsy 0[x]  1[]  2[]  3[]      5a Motor Arm - left 0[x]  1[]  2[]  3[]  4[]  UN[]    5b Motor Arm - Right 0[x]  1[]  2[]  3[]  4[]  UN[]    6a Motor Leg - Left 0[x]  1[]  2[]  3[]  4[]  UN[]    6b Motor Leg - Right 0[x]  1[]  2[]  3[]  4[]  UN[]    7 Limb Ataxia 0[x]  1[]  2[]  3[]  UN[]     8 Sensory 0[x]  1[]  2[]  UN[]      9 Best Language 0[x]  1[]  2[]  3[]      10 Dysarthria 0[x]  1[]  2[]  UN[]      11 Extinct. and Inattention 0[x]  1[]   2[]       TOTAL: 0       ROS   Constitutional Denies weight loss, fever and chills.   HEENT Denies changes in vision and hearing.   Respiratory Denies SOB and cough.   CV Denies palpitations and CP   GI Denies abdominal pain, nausea, vomiting and diarrhea.   GU Denies dysuria and urinary frequency.   MSK Denies myalgia and joint pain.   Skin Denies rash and pruritus.   Neurological Denies headache and syncope.   Psychiatric Denies recent changes in mood. Denies anxiety and depression.    Past History   Past Medical History:  Diagnosis Date   Abdominal pain    Arthritis    cervical disc degeneration/ oa left knee, carpal tunnel rt wrist, adhesive capsulitis right shoulder, rt hand weakness; lumbar degeneration   Carpal tunnel syndrome    Complication of anesthesia 2006-at [facility]   breathing problems-no BP med given prior to surgery;  hx of being very sleepy after colon  surgery --  states no problems with last right total knee replacement 2013   Constipation    Diabetes mellitus without complication    borderline - diet control   Diverticulitis hx of   Diverticulosis    E. coli infection    2020   Frequent UTI    hx of urethral injury during colon surgery - states frequent uti's since   GERD (gastroesophageal reflux disease)    H/O hiatal hernia    History of palpitations    in the past   History of shingles    has a lingering itching on back where shingles were   Hyperlipidemia    Hypertension    Numbness and tingling in right hand    pt. states has numbness of right hand very frequently-watch positioning   Obesity    Osteoarthritis    Osteoporosis    Pain    pain left knee and pain right hip and right groin   Personal history of colonic polyps-adenoma 08/26/2008   Pneumonia 2005   Shortness of breath 09/14/2021   Vitamin D deficiency    Past Surgical History:  Procedure Laterality Date   ABDOMINAL HYSTERECTOMY     bladder tack     BREAST BIOPSY Left     BREAST SURGERY     breast duct resection- benign   CARPAL TUNNEL RELEASE Right 06/04/2014   Procedure: RIGHT CARPAL TUNNEL RELEASE;  Surgeon: Dominica Severin, MD;  Location: [facility];  Service: Orthopedics;  Laterality: Right;   COLON RESECTION  2008   hx diverticulosis   COLON SURGERY     COLONOSCOPY     colonoscopy [id number]     JOINT REPLACEMENT     OOPHORECTOMY     POLYPECTOMY     skin graft left arm - traumatic compression injury left upper arm     temporary ureter stent     TOTAL KNEE ARTHROPLASTY  01/05/2012   Procedure: TOTAL KNEE ARTHROPLASTY;  Surgeon: Loanne Drilling, MD;  Location: [facility];  Service: Orthopedics;  Laterality: Right;   TOTAL KNEE ARTHROPLASTY Left 08/17/2014   Procedure: LEFT TOTAL KNEE ARTHROPLASTY;  Surgeon: Loanne Drilling, MD;  Location: [facility];  Service: Orthopedics;  Laterality: Left;   ureter repair for tyransected left ureter     Family History  Problem Relation Age of Onset   Breast cancer Mother 45   Hypertension Mother    Prostate cancer Father    Hypertension Father    Dementia Sister    Lung cancer Brother    Hypertension Brother    COPD Brother    Cancer Maternal Grandmother    Arthritis Sister    Hypertension Sister    Arthritis Sister    Hypertension Child    Hypertension Child    Hypertension Child    Colon polyps Neg Hx    Esophageal cancer Neg Hx    Rectal cancer Neg Hx    Stomach cancer Neg Hx    Colon cancer Neg Hx    Social History   Socioeconomic History   Marital status: Married    Spouse name: Not on file   Number of children: 6   Years of education: Not on file   Highest education level: Not on file  Occupational History   Occupation: DESIGNER    Employer: [employer]  Tobacco Use   Smoking status: Never   Smokeless tobacco: Never  Vaping Use   Vaping Use: Never used  Substance and Sexual  Activity   Alcohol use: No   Drug use: No   Sexual activity: Not Currently  Other Topics  Concern   Not on file  Social History Narrative   The patient is married and has 6 children   Operates a business   No alcohol tobacco or drug use   Social Determinants of Corporate investment banker Strain: Low Risk  (05/19/2021)   Overall Financial Resource Strain (CARDIA)    Difficulty of Paying Living Expenses: Not hard at all  Food Insecurity: No Food Insecurity (10/19/2022)   Hunger Vital Sign    Worried About Running Out of Food in the Last Year: Never true    Ran Out of Food in the Last Year: Never true  Transportation Needs: No Transportation Needs (10/19/2022)   PRAPARE - Administrator, Civil Service (Medical): No    Lack of Transportation (Non-Medical): No  Physical Activity: Inactive (05/19/2021)   Exercise Vital Sign    Days of Exercise per Week: 0 days    Minutes of Exercise per Session: 0 min  Stress: No Stress Concern Present (05/19/2021)   [name]    Feeling of Stress : Only a little  Social Connections: Socially Integrated (02/05/2019)   Social Connection and Isolation Panel [NHANES]    Frequency of Communication with Friends and Family: More than three times a week    Frequency of Social Gatherings with Friends and Family: More than three times a week    Attends Religious Services: More than 4 times per year    Active Member of Clubs or Organizations: Yes    Attends Engineer, structural: More than 4 times per year    Marital Status: Married   Allergies  Allergen Reactions   Bee Venom Hives   Latex Hives and Itching   Codeine Other (See Comments)    Hallucinations    Hydrocodone Other (See Comments)    hallucinations   Oxycodone Other (See Comments)    hallucinations    Medications  (Not in a hospital admission)    Vitals   Vitals:   02/11/23 2034 02/11/23 2039 02/11/23 2118  BP: (!) 154/64  (!) 148/65  Pulse: 71  72  Resp: 19  19  Temp: 98 F (36.7 C)     SpO2: 96%  97%  Weight:  103.9 kg   Height:   (1.676 m)      Body mass index is 36.96 kg/m.  Physical Exam   General: Laying comfortably in bed; in no acute distress.  HENT: Normal oropharynx and mucosa. Normal external appearance of ears and nose.  Neck: Supple, no pain or tenderness  CV: No JVD. No peripheral edema.  Pulmonary: Symmetric Chest rise. Normal respiratory effort.  Abdomen: Soft to touch, non-tender.  Ext: No cyanosis, edema, or deformity  Skin: No rash. Normal palpation of skin.   Musculoskeletal: Normal digits and nails by inspection. No clubbing.   Neurologic Examination  Mental status/Cognition: Alert, oriented to self, place, month and year, good attention.  Speech/language: Fluent, comprehension intact, object naming intact, repetition intact.  Cranial nerves:   CN II Pupils equal and reactive to light, no VF deficits   CN III,IV,VI EOM intact, no gaze preference or deviation, no nystagmus    CN V normal sensation in V1, V2, and V3 segments bilaterally    CN VII no asymmetry, no nasolabial fold flattening    CN VIII normal hearing  to speech    CN IX & X normal palatal elevation, no uvular deviation    CN XI 5/5 head turn and 5/5 shoulder shrug bilaterally    CN XII midline tongue protrusion    Motor:  Muscle bulk: Normal, tone normal, pronator drift none noted.  Mvmt Root Nerve  Muscle Right Left Comments  SA C5/6 Ax Deltoid 5 5   EF C5/6 Mc Biceps 5 5   EE C6/7/8 Rad Triceps 5 5   WF C6/7 Med FCR     WE C7/8 PIN ECU     F Ab C8/T1 U ADM/FDI 5 5   HF L1/2/3 Fem Illopsoas 5 5   KE L2/3/4 Fem Quad 5 5   DF L4/5 D Peron Tib Ant 5 5   PF S1/2 Tibial Grc/Sol 5 5    Sensation:  Light touch Intact throughout   Pin prick    Temperature    Vibration   Proprioception    Coordination/Complex Motor:  - Finger to Nose intact bilaterally - Heel to shin intact bilaterally - Rapid alternating movement are normal - Gait: Deferred for patient's  safety. Labs   CBC:  Recent Labs  Lab 02/11/23 2036 02/11/23 2048  WBC 10.3  --   NEUTROABS 5.8  --   HGB 14.1 13.9  HCT 41.3 41.0  MCV 89.6  --   PLT 250  --     Basic Metabolic Panel:  Lab Results  Component Value Date   NA 138 02/11/2023   K 4.1 02/11/2023   CO2 23 02/11/2023   GLUCOSE 119 (H) 02/11/2023   BUN 37 (H) 02/11/2023   CREATININE 1.70 (H) 02/11/2023   CALCIUM 9.2 02/11/2023   GFRNONAA 32 (L) 02/11/2023   GFRAA 54 (L) 06/01/2020   Lipid Panel:  Lab Results  Component Value Date   LDLCALC 98 04/18/2022   HgbA1c:  Lab Results  Component Value Date   HGBA1C 6.4 (H) 04/18/2022   Urine Drug Screen: No results found for: "LABOPIA", "COCAINSCRNUR", "LABBENZ", "AMPHETMU", "THCU", "LABBARB"  Alcohol Level     Component Value Date/Time   ETH <10 02/11/2023 2036    CT Head without contrast(Personally reviewed): CTH was negative for a large hypodensity concerning for a large territory infarct or hyperdensity concerning for an ICH  MR Angio head without contrast and Carotid Duplex BL: pending  MRI Brain: Pending  Impression   VIVIANE SEMIDEY is a 82 y.o. female with PMH significant for  diabetes, hypertension, hyperlipidemia, obesity, osteoarthritis, hiatal hernia, presents with episode of left upper extremity numbness.  Her episode is concerning for TIA.  She does have stroke risk factors including diabetes, hypertension, hyperlipidemia.  Recommend medicine admission with TIA/stroke workup.  Recommendations  - Frequent Neuro checks per stroke unit protocol - Recommend brain imaging with MRI Brain without contrast - Recommend Vascular imaging with MRA Angio Head without contrast and US Carotid doppler - Recommend obtaining TTE - Recommend obtaining Lipid panel with LDL - Please start statin if LDL > 70 - Recommend HbA1c to evaluate for diabetes and how well it is controlled. - Antithrombotic -aspirin 81 mg daily along with Plavix 75 mg daily for  21 days, followed by aspirin 81 mg daily alone. - Recommend DVT ppx - SBP goal - permissive hypertension first 24 h < 220/110. Held home meds.  - Recommend Telemetry monitoring for arrythmia - Recommend bedside swallow screen prior to PO intake. - Stroke education booklet - Recommend PT/OT/SLP consult   ______________________________________________________________________  Thank you for the opportunity to take part in the care of this patient. If you have any further questions, please contact the neurology consultation attending.  Signed,  [place] Pager Number [phone] _ _ _   _ __   _ __ _ _  __ __   _ __   __ _

## 2023-02-11 NOTE — Assessment & Plan Note (Addendum)
Pt with suspected hypertensive bleed in Dec 2023. Dont think we can risk permissive HTN here. Cont home amlodipine Increase home hydralazine to TID starting tomorrow (was taking once daily) Hold HCTZ Hold olmesartan Add PRN IV hydralazine for SBP > 160.

## 2023-02-12 ENCOUNTER — Observation Stay (HOSPITAL_BASED_OUTPATIENT_CLINIC_OR_DEPARTMENT_OTHER): Payer: Medicare Other

## 2023-02-12 ENCOUNTER — Observation Stay (HOSPITAL_COMMUNITY): Payer: Medicare Other

## 2023-02-12 DIAGNOSIS — G459 Transient cerebral ischemic attack, unspecified: Secondary | ICD-10-CM | POA: Diagnosis present

## 2023-02-12 DIAGNOSIS — N179 Acute kidney failure, unspecified: Secondary | ICD-10-CM | POA: Diagnosis present

## 2023-02-12 DIAGNOSIS — N1831 Chronic kidney disease, stage 3a: Secondary | ICD-10-CM

## 2023-02-12 DIAGNOSIS — E1122 Type 2 diabetes mellitus with diabetic chronic kidney disease: Secondary | ICD-10-CM

## 2023-02-12 DIAGNOSIS — I10 Essential (primary) hypertension: Secondary | ICD-10-CM | POA: Diagnosis not present

## 2023-02-12 DIAGNOSIS — Z8673 Personal history of transient ischemic attack (TIA), and cerebral infarction without residual deficits: Secondary | ICD-10-CM | POA: Diagnosis present

## 2023-02-12 HISTORY — DX: Acute kidney failure, unspecified: N17.9

## 2023-02-12 LAB — ECHOCARDIOGRAM COMPLETE
AR max vel: 1.58 cm2
AV Area VTI: 1.69 cm2
AV Area mean vel: 1.6 cm2
AV Mean grad: 14 mmHg
AV Peak grad: 26.6 mmHg
Ao pk vel: 2.58 m/s
Area-P 1/2: 2.66 cm2
Height: 66 in
MV VTI: 2.14 cm2
S' Lateral: 2.8 cm
Weight: 3664 oz

## 2023-02-12 LAB — LIPID PANEL
Cholesterol: 267 mg/dL — ABNORMAL HIGH (ref 0–200)
HDL: 37 mg/dL — ABNORMAL LOW (ref >40)
LDL Cholesterol: 198 mg/dL — ABNORMAL HIGH (ref 0–99)
Total CHOL/HDL Ratio: 7.2 RATIO
Triglycerides: 162 mg/dL — ABNORMAL HIGH (ref <150)
VLDL: 32 mg/dL (ref 0–40)

## 2023-02-12 LAB — BASIC METABOLIC PANEL
Anion gap: 10 (ref 5–15)
BUN: 24 mg/dL — ABNORMAL HIGH (ref 8–23)
CO2: 25 mmol/L (ref 22–32)
Calcium: 9.2 mg/dL (ref 8.9–10.3)
Chloride: 103 mmol/L (ref 98–111)
Creatinine, Ser: 1.22 mg/dL — ABNORMAL HIGH (ref 0.44–1.00)
GFR, Estimated: 45 mL/min — ABNORMAL LOW (ref >60)
Glucose, Bld: 175 mg/dL — ABNORMAL HIGH (ref 70–99)
Potassium: 3.7 mmol/L (ref 3.5–5.1)
Sodium: 138 mmol/L (ref 135–145)

## 2023-02-12 LAB — HEMOGLOBIN A1C
Hgb A1c MFr Bld: 6.1 % — ABNORMAL HIGH (ref 4.8–5.6)
Mean Plasma Glucose: 128.37 mg/dL

## 2023-02-12 MED ORDER — HYDRALAZINE HCL 10 MG PO TABS
10.0000 mg | ORAL_TABLET | Freq: Three times a day (TID) | ORAL | Status: DC
  Administered 2023-02-12 (×2): 10 mg via ORAL
  Filled 2023-02-12 (×2): qty 1

## 2023-02-12 MED ORDER — ASPIRIN 81 MG PO TBEC
81.0000 mg | DELAYED_RELEASE_TABLET | Freq: Every day | ORAL | Status: DC
  Administered 2023-02-12: 81 mg via ORAL
  Filled 2023-02-12: qty 1

## 2023-02-12 MED ORDER — CLOPIDOGREL BISULFATE 75 MG PO TABS: 75.0000 mg | ORAL_TABLET | Freq: Every day | ORAL | 0 refills | Status: AC

## 2023-02-12 MED ORDER — AMLODIPINE BESYLATE 10 MG PO TABS
10.0000 mg | ORAL_TABLET | Freq: Every day | ORAL | Status: DC
  Administered 2023-02-12: 10 mg via ORAL
  Filled 2023-02-12: qty 2

## 2023-02-12 MED ORDER — ROSUVASTATIN CALCIUM 20 MG PO TABS
40.0000 mg | ORAL_TABLET | Freq: Every day | ORAL | Status: DC
  Administered 2023-02-12: 40 mg via ORAL
  Filled 2023-02-12: qty 2

## 2023-02-12 MED ORDER — ROSUVASTATIN CALCIUM 40 MG PO TABS: 40.0000 mg | ORAL_TABLET | Freq: Every day | ORAL | 0 refills | Status: DC

## 2023-02-12 MED ORDER — STROKE: EARLY STAGES OF RECOVERY BOOK: Freq: Once | Status: DC

## 2023-02-12 MED ORDER — ACETAMINOPHEN 325 MG PO TABS: 650.0000 mg | ORAL_TABLET | ORAL | Status: DC | PRN

## 2023-02-12 MED ORDER — CLOPIDOGREL BISULFATE 75 MG PO TABS
75.0000 mg | ORAL_TABLET | Freq: Every day | ORAL | Status: DC
  Administered 2023-02-12: 75 mg via ORAL
  Filled 2023-02-12: qty 1

## 2023-02-12 MED ORDER — ACETAMINOPHEN 160 MG/5ML PO SOLN: 650.0000 mg | ORAL | Status: DC | PRN

## 2023-02-12 MED ORDER — HYDRALAZINE HCL 20 MG/ML IJ SOLN: 5.0000 mg | INTRAMUSCULAR | Status: DC | PRN

## 2023-02-12 MED ORDER — ACETAMINOPHEN 650 MG RE SUPP: 650.0000 mg | RECTAL | Status: DC | PRN

## 2023-02-12 NOTE — ED Notes (Signed)
Pt at vascular

## 2023-02-12 NOTE — Assessment & Plan Note (Addendum)
Mild AKI with creat 1.6 up from 1.0 in March. Sees Dr. Thedore Mins at [facility] for CKD. Hold torsemide for the moment Hold olmesartan Hold HCTZ Repeat BMP in AM May want to contact nephro in AM if not rapidly improving.

## 2023-02-12 NOTE — Progress Notes (Addendum)
STROKE TEAM PROGRESS NOTE   INTERVAL HISTORY Her granddaughter is at the bedside.  Patient is laying in the bed awake alert in no apparent distress. She states that she developed acute onset of left arm and left face numbness which had resolved and then it resumed and she states her lips were swollen the second time.  She states that the symptoms would resolve and come back intermittently lasting only about 15 minutes.  She states she has not had any numbness since being in the hospital She has been started on aspirin and Plavix, and Crestor 40 mg MRI brain was negative for acute process 2D echo and ultrasound of bilateral carotids is pending  Vitals:   02/12/23 0715 02/12/23 0730 02/12/23 0806 02/12/23 0959  BP:    131/86  Pulse: 69 74  67  Resp: (!) 21 (!) 25  (!) 25  Temp:   98.3 F (36.8 C)   TempSrc:   Oral   SpO2: 93% 92%  96%  Weight:      Height:       CBC:  Recent Labs  Lab 02/11/23 2036 02/11/23 2048  WBC 10.3  --   NEUTROABS 5.8  --   HGB 14.1 13.9  HCT 41.3 41.0  MCV 89.6  --   PLT 250  --    Basic Metabolic Panel:  Recent Labs  Lab 02/11/23 2036 02/11/23 2048  NA 137 138  K 4.2 4.1  CL 101 106  CO2 23  --   GLUCOSE 123* 119*  BUN 35* 37*  CREATININE 1.60* 1.70*  CALCIUM 9.2  --    Lipid Panel:  Recent Labs  Lab 02/12/23 0415  CHOL 267*  TRIG 162*  HDL 37*  CHOLHDL 7.2  VLDL 32  LDLCALC 161*   HgbA1c:  Recent Labs  Lab 02/12/23 0415  HGBA1C 6.1*   Urine Drug Screen: No results for input(s): "LABOPIA", "COCAINSCRNUR", "LABBENZ", "AMPHETMU", "THCU", "LABBARB" in the last 168 hours.  Alcohol Level  Recent Labs  Lab 02/11/23 2036  ETH <10    IMAGING past 24 hours DG CHEST PORT 1 VIEW  Result Date: 02/12/2023 CLINICAL DATA:  Altered mental status. EXAM: PORTABLE CHEST 1 VIEW COMPARISON:  Chest x-ray dated October 16, 2022. FINDINGS: The heart size and mediastinal contours are within normal limits. Chronic eventration of the right  hemidiaphragm. Both lungs are clear. The visualized skeletal structures are unremarkable. IMPRESSION: No active disease. Electronically Signed   By: Obie Dredge M.D.   On: 02/12/2023 09:58   MR BRAIN WO CONTRAST  Result Date: 02/12/2023 CLINICAL DATA:  Initial evaluation for neuro deficit, stroke. EXAM: MRI HEAD WITHOUT CONTRAST MRA HEAD WITHOUT CONTRAST TECHNIQUE: Multiplanar, multi-echo pulse sequences of the brain and surrounding structures were acquired without intravenous contrast. Angiographic images of the Circle of Willis were acquired using MRA technique without intravenous contrast. COMPARISON:  Prior CT from 02/11/2023 and MRI from 10/18/2022. FINDINGS: MRI HEAD FINDINGS Brain: Cerebral volume within normal limits for age. Patchy T2/FLAIR hyperintensity involving the periventricular deep white matter both cerebral hemispheres, consistent with chronic small vessel ischemic disease, moderately advanced in nature. No abnormal foci of restricted diffusion to suggest acute or subacute ischemia. Gray-white matter differentiation maintained. No areas of chronic cortical infarction. No acute intracranial hemorrhage. Multiple scattered chronic micro hemorrhages again noted, most pronounced about the parieto-occipital regions bilaterally. Susceptibility artifact related to previous hemorrhage seen at the right occipital lobe noted. While these findings are nonspecific as above these are favored  to be hypertensive in nature. No mass lesion, midline shift or mass effect. No hydrocephalus or extra-axial fluid collection. Empty sella noted. Vascular: Major intracranial vascular flow voids are maintained. Skull and upper cervical spine: Minimal cerebellar tonsillar ectopia without Chiari malformation. Degenerative thickening noted at the tectorial membrane. Bone marrow signal intensity within normal limits. No scalp soft tissue abnormality. Sinuses/Orbits: Patient status post bilateral ocular lens replacement.  Globes and orbital soft tissues demonstrate no acute finding. Paranasal sinuses are clear. No mastoid effusion. Other: None. MRA HEAD FINDINGS Anterior circulation: Visualized distal cervical segments of the internal carotid arteries are patent with antegrade flow. Petrous segments patent bilaterally. Mild atheromatous irregularity seen about the carotid siphons without hemodynamically significant stenosis or other abnormality. A1 segments patent bilaterally. Normal anterior communicating complex. Both ACAs are patent without significant stenosis. No M1 stenosis or occlusion. No proximal MCA branch occlusion or high-grade stenosis. Distal MCA branches perfused and symmetric. Mild scattered small vessel atheromatous irregularity noted. Posterior circulation: Visualized V4 segments widely patent. Right vertebral artery dominant. Neither PICA origin visualized. Basilar patent without stenosis. Superior cerebral arteries patent bilaterally. Both PCAs primarily supplied via the basilar. Mild atheromatous irregularity about the PCAs without hemodynamically significant stenosis. PCAs remain patent to their distal aspects. Anatomic variants: None significant.  No intracranial aneurysm. IMPRESSION: MRI HEAD IMPRESSION: 1. No acute intracranial infarct or other abnormality. 2. 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 3. Multiple scattered chronic micro hemorrhages, most pronounced about the parieto-occipital regions bilaterally. Findings are favored to be hypertensive in nature. MRA HEAD IMPRESSION: 1. Negative intracranial MRA for large vessel occlusion. 2. Mild intracranial atheromatous disease, but no hemodynamically significant or correctable stenosis. Electronically Signed   By: Rise Mu M.D.   On: 02/12/2023 02:54   MR ANGIO HEAD WO CONTRAST  Result Date: 02/12/2023 CLINICAL DATA:  Initial evaluation for neuro deficit, stroke. EXAM: MRI HEAD WITHOUT CONTRAST MRA HEAD WITHOUT  CONTRAST TECHNIQUE: Multiplanar, multi-echo pulse sequences of the brain and surrounding structures were acquired without intravenous contrast. Angiographic images of the Circle of Willis were acquired using MRA technique without intravenous contrast. COMPARISON:  Prior CT from 02/11/2023 and MRI from 10/18/2022. FINDINGS: MRI HEAD FINDINGS Brain: Cerebral volume within normal limits for age. Patchy T2/FLAIR hyperintensity involving the periventricular deep white matter both cerebral hemispheres, consistent with chronic small vessel ischemic disease, moderately advanced in nature. No abnormal foci of restricted diffusion to suggest acute or subacute ischemia. Gray-white matter differentiation maintained. No areas of chronic cortical infarction. No acute intracranial hemorrhage. Multiple scattered chronic micro hemorrhages again noted, most pronounced about the parieto-occipital regions bilaterally. Susceptibility artifact related to previous hemorrhage seen at the right occipital lobe noted. While these findings are nonspecific as above these are favored to be hypertensive in nature. No mass lesion, midline shift or mass effect. No hydrocephalus or extra-axial fluid collection. Empty sella noted. Vascular: Major intracranial vascular flow voids are maintained. Skull and upper cervical spine: Minimal cerebellar tonsillar ectopia without Chiari malformation. Degenerative thickening noted at the tectorial membrane. Bone marrow signal intensity within normal limits. No scalp soft tissue abnormality. Sinuses/Orbits: Patient status post bilateral ocular lens replacement. Globes and orbital soft tissues demonstrate no acute finding. Paranasal sinuses are clear. No mastoid effusion. Other: None. MRA HEAD FINDINGS Anterior circulation: Visualized distal cervical segments of the internal carotid arteries are patent with antegrade flow. Petrous segments patent bilaterally. Mild atheromatous irregularity seen about the carotid  siphons without hemodynamically significant stenosis or other abnormality. A1 segments patent  bilaterally. Normal anterior communicating complex. Both ACAs are patent without significant stenosis. No M1 stenosis or occlusion. No proximal MCA branch occlusion or high-grade stenosis. Distal MCA branches perfused and symmetric. Mild scattered small vessel atheromatous irregularity noted. Posterior circulation: Visualized V4 segments widely patent. Right vertebral artery dominant. Neither PICA origin visualized. Basilar patent without stenosis. Superior cerebral arteries patent bilaterally. Both PCAs primarily supplied via the basilar. Mild atheromatous irregularity about the PCAs without hemodynamically significant stenosis. PCAs remain patent to their distal aspects. Anatomic variants: None significant.  No intracranial aneurysm. IMPRESSION: MRI HEAD IMPRESSION: 1. No acute intracranial infarct or other abnormality. 2. 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 3. Multiple scattered chronic micro hemorrhages, most pronounced about the parieto-occipital regions bilaterally. Findings are favored to be hypertensive in nature. MRA HEAD IMPRESSION: 1. Negative intracranial MRA for large vessel occlusion. 2. Mild intracranial atheromatous disease, but no hemodynamically significant or correctable stenosis. Electronically Signed   By: Rise Mu M.D.   On: 02/12/2023 02:54   CT HEAD CODE STROKE WO CONTRAST  Result Date: 02/11/2023 CLINICAL DATA:  Code stroke. EXAM: CT HEAD WITHOUT CONTRAST TECHNIQUE: Contiguous axial images were obtained from the base of the skull through the vertex without intravenous contrast. RADIATION DOSE REDUCTION: This exam was performed according to the departmental dose-optimization program which includes automated exposure control, adjustment of the mA and/or kV according to patient size and/or use of iterative reconstruction technique. COMPARISON:  Prior  study from 10/20/2022 and earlier. FINDINGS: Brain: Cerebral volume within normal limits for age. Moderate chronic microvascular ischemic changes noted. No acute intracranial hemorrhage. No acute large vessel territory infarct. No mass lesion or midline shift. No hydrocephalus or extra-axial fluid collection. Vascular: No abnormal hyperdense vessel. Scattered calcified atherosclerosis present at the skull base. Skull: Scalp soft tissues and calvarium within normal limits. Sinuses/Orbits: Globes and orbital soft tissues demonstrate no acute finding. Paranasal sinuses are clear. No mastoid effusion. Other: None. ASPECTS [facility] Stroke Program Early CT Score) - Ganglionic level infarction (caudate, lentiform nuclei, internal capsule, insula, M1-M3 cortex): 7 - Supraganglionic infarction (M4-M6 cortex): 3 Total score (0-10 with 10 being normal): 10 IMPRESSION: 1. No acute intracranial abnormality. 2. ASPECTS is 10. 3. Moderately advanced chronic microvascular ischemic disease. These results were communicated to Dr. Derry Lory at 9:01 pm on 02/11/2023 by text page via the [facility] messaging system. Electronically Signed   By: Rise Mu M.D.   On: 02/11/2023 21:02    PHYSICAL EXAM  Temp:  [98 F (36.7 C)-98.4 F (36.9 C)] 98.3 F (36.8 C) (04/15 1214) Pulse Rate:  [63-75] 67 (04/15 0959) Resp:  [13-25] 25 (04/15 0959) BP: (110-154)/(48-86) 131/86 (04/15 0959) SpO2:  [92 %-99 %] 96 % (04/15 0959) Weight:  [103.9 kg] 103.9 kg (04/14 2039)  General - Well nourished, well developed, in no apparent distress. Cardiovascular - Regular rhythm and rate.  Mental Status -  Level of arousal and orientation to time, place, and person were intact. Language including expression, naming, repetition, comprehension was assessed and found intact. Attention span and concentration were normal. Recent and remote memory were intact. Fund of Knowledge was assessed and was intact.  Cranial Nerves II - XII - II -  Visual field intact OU. III, IV, VI - Extraocular movements intact. V - Facial sensation intact bilaterally. VII - Facial movement intact bilaterally. VIII - Hearing & vestibular intact bilaterally. X - Palate elevates symmetrically. XI - Chin turning & shoulder shrug intact bilaterally. XII - Tongue protrusion intact.  Motor Strength - The patient's strength was normal in all extremities and pronator drift was absent.  Bulk was normal and fasciculations were absent.   Motor Tone - Muscle tone was assessed at the neck and appendages and was normal.  Sensory - Light touch, temperature/pinprick were assessed and were symmetrical.    Coordination - The patient had normal movements in the hands and feet with no ataxia or dysmetria.  Tremor was absent.  Gait and Station - deferred.  ASSESSMENT/PLAN [name] is a 82 y.o. 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   TIA vs. Metabolic encephalopathy with AKI Code Stroke CT head No acute abnormality. Small vessel disease. ASPECTS 10.    MRI no acute process.  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  MRI negative for stroke.  CTA head and neck unremarkable.  LDL 98, A1c 6.4.  EF 50 to 55%.  Discharged on DAPT 09/2022 admitted for right occipital ICH.  CT head and neck no LVO, CTV no sinus thrombosis.   MRI stable hematoma with microhemorrhages.  12/04/2022 follow-up with Dr. Trey Paula he had LBN, restart aspirin 81  Hypertension Home meds: Hydralazine 10 mg daily, olmesartan-amlodipine-HCTZ 40 mg - 10 mg-12.5  Stable Now on Norvasc 10, hydralazine 10 every 8 hours Hold off diuretic or ARB's given AKI. Long-term BP goal normotensive  Hyperlipidemia Home meds: None LDL 198, goal < 70 Add Crestor 40 mg Continue statin at discharge  Diabetes type II Controlled Home meds: Glipizide 10 mg HgbA1c 6.1, goal < 7.0 CBGs SSI Outpatient follow-up  Other Stroke Risk Factors Advanced Age >/= 68  Obesity, Body mass index is 36.96 kg/m., BMI >/= 30 associated with increased stroke risk, recommend weight loss, diet and exercise as appropriate  OSA on CPAP  Other Active Problems AKI on CKD- Cre 1.7->1.22, resolved, likely related to BP medication, recommend avoid HCTZ and ARB's or ACEI.  Hospital day # 0  Gevena Mart DNP, ACNPC-AG  [facility]  ATTENDING NOTE: I reviewed above note and agree with the assessment and plan. Pt was seen and examined.   Daughter at bedside.  Patient stated that she had left hand numbness on Saturday for 10 minutes.  Again on Sunday for 30 minutes associate with left face numbness until this morning.  Currently resolved.  Stroke workup negative.  Found to have AKI with creatinine 1.7, this morning creatinine normalized  LDL 198, A1c 6.7.  She does have history of TIA and occipital ICH last year, with risk factors of hypertension, hyperlipidemia, diabetes, obesity and OSA.  Patient symptoms could be TIA versus encephalopathy in the setting of AKI.  Will recommend DAPT for 3 weeks and then Plavix alone.  Started Crestor 40 for HLD.  Recommend to avoid diuretic and ARB's and ACE inhibitor's for AKI.  Stroke risk factor modification.  Patient will continue follow-up with Dr. Everlena Cooper at [facility].   For  detailed assessment and plan, please refer to above/below as I  have made changes wherever appropriate.   Neurology will sign off. Please call with questions. Pt will follow up with Dr. Everlena Cooper at [facility] in about 4 weeks. Thanks for the consult.   Marvel Plan, MD PhD Stroke Neurology 02/12/2023 2:08 PM    To contact Stroke Continuity provider, please refer to [email or web address]. After hours, contact General Neurology

## 2023-02-12 NOTE — Progress Notes (Signed)
CSW met with patient to discuss need for meals and housekeeping assistance. Patient said she had received a call from her [facility] and discussed those needs, but someone had told her that the social worker may also be able to help. 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 CSW encouraged patient to contact her [employer] back to ask about what programs they would have available to assist. CSW also encouraged patient to enquire with her church about some temporary assistance with meals/groceries. Patient said that she and her husband have given to a lot of people over the years but they are not good at asking for help themselves. CSW discussed with patient how everyone needs help sometimes. Patient in agreement.   No further TOC needs.   Blenda Nicely, [place] Clinical Social Worker [phone]

## 2023-02-12 NOTE — Assessment & Plan Note (Signed)
Pt with TIA like symptoms today. CT head today neg for bleed (in contrast to Dec when she had hypertensive bleed). Stroke pathway See neuro consult note Tele monitor 2d echo MRI brain Would rather not CTA in setting of AKI: MRA head Carotid dopplers PT/OT/SLP ASA 81 daily Plavix 75 for 21 days

## 2023-02-12 NOTE — Discharge Summary (Signed)
Physician Discharge Summary   Patient: [name] MRN: [id number] DOB: Dec 15, 1940  Admit date:     02/11/2023  Discharge date: 02/12/23  Discharge Physician: Arnetha Courser   PCP: Vania Rea, FNP   Recommendations at discharge:  Please obtain CBC and BMP in 1 week Patient had AKI, please encourage p.o. hydration, home torsemide was discontinued and PCP need to reevaluate the need. Follow-up with primary care provider Follow-up with neurology  Discharge Diagnoses: Principal Problem:   TIA (transient ischemic attack) Active Problems:   Essential hypertension   Type 2 diabetes mellitus with stage 3 chronic kidney disease, without long-term current use of insulin   AKI (acute kidney injury)   Hospital Course: Taken from H&P.   [name] is a 82 y.o. female with medical history significant of HTN, DM2.  Pt had small occipital lobe non traumatic ICH in Dec, felt to be likely hypertensive bleed presented with left-sided numbness, initially started around 4 AM on 4/14 involving left hand, resolved and then reoccurred in the afternoon with little expansion to left facial and tongue numbness.  ED course.  Hemodynamically stable, labs pertinent for AKI with creatinine at 1.6, baseline appears to be 1.06-1.2.  Imaging including CT head, MRI brain was negative for any acute abnormalities.  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 No tPA as she was out of window.  Neurology saw her and recommended aspirin and Plavix together for 21 days followed by aspirin alone.  Better blood pressure control.  4/15: Vital stable.  Lipid panel with significantly elevated cholesterol at 267, LDL 198 with goal less than 70.  Patient was started on statin.  A1c of 6.1. PT and OT with no recommendations. Echocardiogram with normal EF, mild aortic stenosis and no other significant abnormality US carotid  with no significant stenosis. Repeat BMP with improvement of creatinine to 1.22.  Her home torsemide was stopped and she will continue the rest of her home medications and need to have a close follow-up with her primary care provider and neurologist for further recommendations and for better control of hypertension.   Assessment and Plan: * TIA (transient ischemic attack) Pt with TIA like symptoms today. CT head today neg for bleed (in contrast to Dec when she had hypertensive bleed). Stroke pathway See neuro consult note Tele monitor 2d echo MRI brain Would rather not CTA in setting of AKI: MRA head Carotid dopplers PT/OT/SLP ASA 81 daily Plavix 75 for 21 days  AKI (acute kidney injury) Mild AKI with creat 1.6 up from 1.0 in March. Sees Dr. Thedore Mins at [facility] for CKD. Hold torsemide for the moment Hold olmesartan Hold HCTZ Repeat BMP in AM May want to contact nephro in AM if not rapidly improving.  Type 2 diabetes mellitus with stage 3 chronic kidney disease, without long-term current use of insulin Hold glipizide CBG checks while here  Essential hypertension Pt with suspected hypertensive bleed in Dec 2023. Dont think we can risk permissive HTN here. Cont home amlodipine Increase home hydralazine to TID starting tomorrow (was taking once daily) Hold HCTZ Hold olmesartan Add PRN IV hydralazine for SBP > 160.    Consultants: Neurology Procedures performed: None Disposition: Home Diet recommendation:  Discharge Diet Orders (From admission, onward)     Start     Ordered   02/12/23 0000  Diet - low sodium heart healthy        02/12/23 1430  Cardiac and Carb modified diet DISCHARGE MEDICATION: Allergies as of 02/12/2023       Reactions   Bee Venom Hives   Latex Hives, Itching   Codeine Other (See Comments)   Hallucinations   Hydrocodone Other (See Comments)   hallucinations   Oxycodone Other (See Comments)   hallucinations         Medication List     STOP taking these medications    torsemide 5 MG tablet Commonly known as: DEMADEX       TAKE these medications    aspirin EC 81 MG tablet Take 81 mg by mouth daily. Swallow whole.   Blood Pressure Kit 1 Units by Does not apply route daily. Check blood pressure daily for hypertension dx code I10   clopidogrel 75 MG tablet Commonly known as: PLAVIX Take 1 tablet (75 mg total) by mouth daily for 21 days. Start taking on: February 13, 2023   estradiol 0.1 MG/GM vaginal cream Commonly known as: ESTRACE Place 1 Applicatorful vaginally 3 (three) times a week.   glipiZIDE 10 MG tablet Commonly known as: GLUCOTROL Take 5 mg by mouth daily before breakfast.   hydrALAZINE 10 MG tablet Commonly known as: APRESOLINE Take 10 mg by mouth daily.   Olmesartan-amLODIPine-HCTZ 40-10-12.5 MG Tabs Take 1 tablet by mouth daily.   OneTouch Delica Plus Lancet33G Misc USE TO CHECK BLOOD SUGAR TWICE DAILY   OneTouch Verio test strip Generic drug: glucose blood USE TO TEST 2 TIMES DAILY   rosuvastatin 40 MG tablet Commonly known as: CRESTOR Take 1 tablet (40 mg total) by mouth daily. Start taking on: February 13, 2023   solifenacin 5 MG tablet Commonly known as: VESICARE Take 5 mg by mouth daily as needed (for urinary retention).        Follow-up Information     Drema Dallas, DO. Schedule an appointment as soon as possible for a visit in 1 month(s).   Specialty: Neurology Contact information: [address]  [address] [place] [address] [phone]         Vania Rea, FNP. Schedule an appointment as soon as possible for a visit in 1 week(s).   Specialty: Family Medicine Contact information: [address] [place] [address] [phone]                Discharge Exam: Ceasar Mons Weights   02/11/23 2039  Weight: 103.9 kg   General.     In no acute distress. Pulmonary.  Lungs clear bilaterally, normal  respiratory effort. CV.  Regular rate and rhythm, no JVD, rub or murmur. Abdomen.  Soft, nontender, nondistended, BS positive. CNS.  Alert and oriented .  No focal neurologic deficit. Extremities.  No edema, no cyanosis, pulses intact and symmetrical. Psychiatry.  Judgment and insight appears normal.   Condition at discharge: stable  The results of significant diagnostics from this hospitalization (including imaging, microbiology, ancillary and laboratory) are listed below for reference.   Imaging Studies: VAS US CAROTID  Result Date: 02/12/2023 Carotid Arterial Duplex Study Patient Name:  [name]  Date of Exam:   02/12/2023 Medical Rec #: [id number]         Accession #:    [id number] Date of Birth: 17-Oct-1941          Patient Gender: F Patient Age:   49 years Exam Location:  [facility] Procedure:      VAS US CAROTID Referring Phys: Lyda Perone --------------------------------------------------------------------------------  Indications:  TIA and Numbness. Risk Factors:      Hypertension, hyperlipidemia, Diabetes. Limitations        Today's exam was limited due to the body habitus of the                    patient and the patient's respiratory variation. Comparison Study:  Prior study done 10/12/21 indicating 1-39% ICA stenosis,                    right ECA >50% Performing Technologist: Sherren Kerns RVS  Examination Guidelines: A complete evaluation includes B-mode imaging, spectral Doppler, color Doppler, and power Doppler as needed of all accessible portions of each vessel. Bilateral testing is considered an integral part of a complete examination. Limited examinations for reoccurring indications may be performed as noted.  Right Carotid Findings: +----------+--------+--------+--------+------------------+------------------+           PSV cm/sEDV cm/sStenosisPlaque DescriptionComments           +----------+--------+--------+--------+------------------+------------------+  CCA Prox  74      11                                intimal thickening +----------+--------+--------+--------+------------------+------------------+ CCA Distal46      10                                intimal thickening +----------+--------+--------+--------+------------------+------------------+ ICA Prox  107     17      1-39%   heterogenous                         +----------+--------+--------+--------+------------------+------------------+ ICA Mid   104     24                                                   +----------+--------+--------+--------+------------------+------------------+ ICA Distal98      20                                                   +----------+--------+--------+--------+------------------+------------------+ ECA       322     14      >50%                                         +----------+--------+--------+--------+------------------+------------------+ +----------+--------+-------+--------+-------------------+           PSV cm/sEDV cmsDescribeArm Pressure (mmHG) +----------+--------+-------+--------+-------------------+ [id number]                                        +----------+--------+-------+--------+-------------------+ +---------+--------+--+--------+--+ VertebralPSV cm/s64EDV cm/s13 +---------+--------+--+--------+--+  Left Carotid Findings: +----------+--------+--------+--------+------------------+------------------+           PSV cm/sEDV cm/sStenosisPlaque DescriptionComments           +----------+--------+--------+--------+------------------+------------------+ CCA Prox  87      17  intimal thickening +----------+--------+--------+--------+------------------+------------------+ CCA Distal76      16                                intimal thickening +----------+--------+--------+--------+------------------+------------------+ ICA Prox  144     29      1-39%    heterogenous                         +----------+--------+--------+--------+------------------+------------------+ ICA Mid   160     29                                                   +----------+--------+--------+--------+------------------+------------------+ ICA Distal164     29                                                   +----------+--------+--------+--------+------------------+------------------+ ECA       50      6                                                    +----------+--------+--------+--------+------------------+------------------+ +----------+--------+--------+--------+-------------------+           PSV cm/sEDV cm/sDescribeArm Pressure (mmHG) +----------+--------+--------+--------+-------------------+ Subclavian109                                         +----------+--------+--------+--------+-------------------+ +---------+--------+--+--------+--+ VertebralPSV cm/s69EDV cm/s11 +---------+--------+--+--------+--+   Summary: Right Carotid: Velocities in the right ICA are consistent with a 1-39% stenosis. Left Carotid: Velocities in the left ICA are consistent with a 1-39% stenosis. Vertebrals:  Bilateral vertebral arteries demonstrate antegrade flow. Subclavians: Normal flow hemodynamics were seen in bilateral subclavian              arteries. *See table(s) above for measurements and observations.     Preliminary    ECHOCARDIOGRAM COMPLETE  Result Date: 02/12/2023    ECHOCARDIOGRAM REPORT   Patient Name:   [name] Date of Exam: 02/12/2023 Medical Rec #:  [id number]        Height:       66.0 in Accession #:    [id number]       Weight:       229.0 lb Date of Birth:  1941/04/25         BSA:          2.118 m Patient Age:    81 years         BP:           121/53 mmHg Patient Gender: F                HR:           68 bpm. Exam Location:  Inpatient Procedure: 2D Echo Indications:    TIA  History:        Patient has prior history of Echocardiogram  examinations, most  recent 04/18/2022.  Sonographer:    [name] RDCS Referring Phys: [phone] JARED M GARDNER IMPRESSIONS  1. Left ventricular ejection fraction, by estimation, is 50 to 55%. The left ventricle has low normal function. The left ventricle has no regional wall motion abnormalities. Left ventricular diastolic function could not be evaluated.  2. Right ventricular systolic function is normal. The right ventricular size is normal. Tricuspid regurgitation signal is inadequate for assessing PA pressure.  3. The mitral valve is normal in structure. No evidence of mitral valve regurgitation. No evidence of mitral stenosis. Severe mitral annular calcification.  4. The aortic valve is calcified. Aortic valve regurgitation is not visualized. Mild aortic valve stenosis. Aortic valve area, by VTI measures 1.69 cm. Aortic valve mean gradient measures 14.0 mmHg. Aortic valve Vmax measures 2.58 m/s.  5. The inferior vena cava is normal in size with greater than 50% respiratory variability, suggesting right atrial pressure of 3 mmHg. FINDINGS  Left Ventricle: Left ventricular ejection fraction, by estimation, is 50 to 55%. The left ventricle has low normal function. The left ventricle has no regional wall motion abnormalities. The left ventricular internal cavity size was normal in size. There is no left ventricular hypertrophy. Left ventricular diastolic function could not be evaluated due to mitral annular calcification (moderate or greater). Left ventricular diastolic function could not be evaluated. Right Ventricle: The right ventricular size is normal. No increase in right ventricular wall thickness. Right ventricular systolic function is normal. Tricuspid regurgitation signal is inadequate for assessing PA pressure. Left Atrium: Left atrial size was normal in size. Right Atrium: Right atrial size was normal in size. Pericardium: There is no evidence of pericardial effusion. Presence of  epicardial fat layer. Mitral Valve: The mitral valve is normal in structure. Severe mitral annular calcification. No evidence of mitral valve regurgitation. No evidence of mitral valve stenosis. MV peak gradient, 9.2 mmHg. The mean mitral valve gradient is 4.0 mmHg. Tricuspid Valve: The tricuspid valve is normal in structure. Tricuspid valve regurgitation is trivial. No evidence of tricuspid stenosis. Aortic Valve: The aortic valve is calcified. Aortic valve regurgitation is not visualized. Mild aortic stenosis is present. Aortic valve mean gradient measures 14.0 mmHg. Aortic valve peak gradient measures 26.6 mmHg. Aortic valve area, by VTI measures 1.69 cm. Pulmonic Valve: The pulmonic valve was not well visualized. Pulmonic valve regurgitation is trivial. No evidence of pulmonic stenosis. Aorta: The aortic root is normal in size and structure. Venous: The inferior vena cava is normal in size with greater than 50% respiratory variability, suggesting right atrial pressure of 3 mmHg. IAS/Shunts: No atrial level shunt detected by color flow Doppler.  LEFT VENTRICLE PLAX 2D LVIDd:         4.70 cm   Diastology LVIDs:         2.80 cm   LV e' medial:    6.42 cm/s LV PW:         1.10 cm   LV E/e' medial:  15.0 LV IVS:        1.00 cm   LV e' lateral:   5.87 cm/s LVOT diam:     2.20 cm   LV E/e' lateral: 16.4 LV SV:         92 LV SV Index:   43 LVOT Area:     3.80 cm  RIGHT VENTRICLE             IVC RV Basal diam:  2.70 cm     IVC diam: 1.70 cm RV  S prime:     12.60 cm/s TAPSE (M-mode): 2.4 cm LEFT ATRIUM             Index        RIGHT ATRIUM           Index LA diam:        3.20 cm 1.51 cm/m   RA Area:     10.40 cm LA Vol (A2C):   74.3 ml 35.08 ml/m  RA Volume:   20.40 ml  9.63 ml/m LA Vol (A4C):   68.6 ml 32.38 ml/m LA Biplane Vol: 76.8 ml 36.26 ml/m  AORTIC VALVE AV Area (Vmax):    1.58 cm AV Area (Vmean):   1.60 cm AV Area (VTI):     1.69 cm AV Vmax:           258.00 cm/s AV Vmean:          174.000 cm/s AV VTI:             0.542 m AV Peak Grad:      26.6 mmHg AV Mean Grad:      14.0 mmHg LVOT Vmax:         107.00 cm/s LVOT Vmean:        73.100 cm/s LVOT VTI:          0.242 m LVOT/AV VTI ratio: 0.45  AORTA Ao Root diam: 3.20 cm Ao Asc diam:  3.20 cm MITRAL VALVE MV Area (PHT): 2.66 cm     SHUNTS MV Area VTI:   2.14 cm     Systemic VTI:  0.24 m MV Peak grad:  9.2 mmHg     Systemic Diam: 2.20 cm MV Mean grad:  4.0 mmHg MV Vmax:       1.52 m/s MV Vmean:      97.1 cm/s MV Decel Time: 285 msec MV E velocity: 96.10 cm/s MV A velocity: 152.00 cm/s MV E/A ratio:  0.63 Kardie Tobb DO Electronically signed by Thomasene Ripple DO Signature Date/Time: 02/12/2023/10:53:21 AM    Final    DG CHEST PORT 1 VIEW  Result Date: 02/12/2023 CLINICAL DATA:  Altered mental status. EXAM: PORTABLE CHEST 1 VIEW COMPARISON:  Chest x-ray dated October 16, 2022. FINDINGS: The heart size and mediastinal contours are within normal limits. Chronic eventration of the right hemidiaphragm. Both lungs are clear. The visualized skeletal structures are unremarkable. IMPRESSION: No active disease. Electronically Signed   By: Obie Dredge M.D.   On: 02/12/2023 09:58   MR BRAIN WO CONTRAST  Result Date: 02/12/2023 CLINICAL DATA:  Initial evaluation for neuro deficit, stroke. EXAM: MRI HEAD WITHOUT CONTRAST MRA HEAD WITHOUT CONTRAST TECHNIQUE: Multiplanar, multi-echo pulse sequences of the brain and surrounding structures were acquired without intravenous contrast. Angiographic images of the Circle of Willis were acquired using MRA technique without intravenous contrast. COMPARISON:  Prior CT from 02/11/2023 and MRI from 10/18/2022. FINDINGS: MRI HEAD FINDINGS Brain: Cerebral volume within normal limits for age. Patchy T2/FLAIR hyperintensity involving the periventricular deep white matter both cerebral hemispheres, consistent with chronic small vessel ischemic disease, moderately advanced in nature. No abnormal foci of restricted diffusion to suggest acute  or subacute ischemia. Gray-white matter differentiation maintained. No areas of chronic cortical infarction. No acute intracranial hemorrhage. Multiple scattered chronic micro hemorrhages again noted, most pronounced about the parieto-occipital regions bilaterally. Susceptibility artifact related to previous hemorrhage seen at the right occipital lobe noted. While these findings are nonspecific as above these are favored to be hypertensive in  nature. No mass lesion, midline shift or mass effect. No hydrocephalus or extra-axial fluid collection. Empty sella noted. Vascular: Major intracranial vascular flow voids are maintained. Skull and upper cervical spine: Minimal cerebellar tonsillar ectopia without Chiari malformation. Degenerative thickening noted at the tectorial membrane. Bone marrow signal intensity within normal limits. No scalp soft tissue abnormality. Sinuses/Orbits: Patient status post bilateral ocular lens replacement. Globes and orbital soft tissues demonstrate no acute finding. Paranasal sinuses are clear. No mastoid effusion. Other: None. MRA HEAD FINDINGS Anterior circulation: Visualized distal cervical segments of the internal carotid arteries are patent with antegrade flow. Petrous segments patent bilaterally. Mild atheromatous irregularity seen about the carotid siphons without hemodynamically significant stenosis or other abnormality. A1 segments patent bilaterally. Normal anterior communicating complex. Both ACAs are patent without significant stenosis. No M1 stenosis or occlusion. No proximal MCA branch occlusion or high-grade stenosis. Distal MCA branches perfused and symmetric. Mild scattered small vessel atheromatous irregularity noted. Posterior circulation: Visualized V4 segments widely patent. Right vertebral artery dominant. Neither PICA origin visualized. Basilar patent without stenosis. Superior cerebral arteries patent bilaterally. Both PCAs primarily supplied via the basilar. Mild  atheromatous irregularity about the PCAs without hemodynamically significant stenosis. PCAs remain patent to their distal aspects. Anatomic variants: None significant.  No intracranial aneurysm. IMPRESSION: MRI HEAD IMPRESSION: 1. No acute intracranial infarct or other abnormality. 2. 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 3. Multiple scattered chronic micro hemorrhages, most pronounced about the parieto-occipital regions bilaterally. Findings are favored to be hypertensive in nature. MRA HEAD IMPRESSION: 1. Negative intracranial MRA for large vessel occlusion. 2. Mild intracranial atheromatous disease, but no hemodynamically significant or correctable stenosis. Electronically Signed   By: Rise Mu M.D.   On: 02/12/2023 02:54   MR ANGIO HEAD WO CONTRAST  Result Date: 02/12/2023 CLINICAL DATA:  Initial evaluation for neuro deficit, stroke. EXAM: MRI HEAD WITHOUT CONTRAST MRA HEAD WITHOUT CONTRAST TECHNIQUE: Multiplanar, multi-echo pulse sequences of the brain and surrounding structures were acquired without intravenous contrast. Angiographic images of the Circle of Willis were acquired using MRA technique without intravenous contrast. COMPARISON:  Prior CT from 02/11/2023 and MRI from 10/18/2022. FINDINGS: MRI HEAD FINDINGS Brain: Cerebral volume within normal limits for age. Patchy T2/FLAIR hyperintensity involving the periventricular deep white matter both cerebral hemispheres, consistent with chronic small vessel ischemic disease, moderately advanced in nature. No abnormal foci of restricted diffusion to suggest acute or subacute ischemia. Gray-white matter differentiation maintained. No areas of chronic cortical infarction. No acute intracranial hemorrhage. Multiple scattered chronic micro hemorrhages again noted, most pronounced about the parieto-occipital regions bilaterally. Susceptibility artifact related to previous hemorrhage seen at the right occipital lobe  noted. While these findings are nonspecific as above these are favored to be hypertensive in nature. No mass lesion, midline shift or mass effect. No hydrocephalus or extra-axial fluid collection. Empty sella noted. Vascular: Major intracranial vascular flow voids are maintained. Skull and upper cervical spine: Minimal cerebellar tonsillar ectopia without Chiari malformation. Degenerative thickening noted at the tectorial membrane. Bone marrow signal intensity within normal limits. No scalp soft tissue abnormality. Sinuses/Orbits: Patient status post bilateral ocular lens replacement. Globes and orbital soft tissues demonstrate no acute finding. Paranasal sinuses are clear. No mastoid effusion. Other: None. MRA HEAD FINDINGS Anterior circulation: Visualized distal cervical segments of the internal carotid arteries are patent with antegrade flow. Petrous segments patent bilaterally. Mild atheromatous irregularity seen about the carotid siphons without hemodynamically significant stenosis or other abnormality. A1 segments patent bilaterally. Normal anterior communicating complex.  Both ACAs are patent without significant stenosis. No M1 stenosis or occlusion. No proximal MCA branch occlusion or high-grade stenosis. Distal MCA branches perfused and symmetric. Mild scattered small vessel atheromatous irregularity noted. Posterior circulation: Visualized V4 segments widely patent. Right vertebral artery dominant. Neither PICA origin visualized. Basilar patent without stenosis. Superior cerebral arteries patent bilaterally. Both PCAs primarily supplied via the basilar. Mild atheromatous irregularity about the PCAs without hemodynamically significant stenosis. PCAs remain patent to their distal aspects. Anatomic variants: None significant.  No intracranial aneurysm. IMPRESSION: MRI HEAD IMPRESSION: 1. No acute intracranial infarct or other abnormality. 2. 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  3. Multiple scattered chronic micro hemorrhages, most pronounced about the parieto-occipital regions bilaterally. Findings are favored to be hypertensive in nature. MRA HEAD IMPRESSION: 1. Negative intracranial MRA for large vessel occlusion. 2. Mild intracranial atheromatous disease, but no hemodynamically significant or correctable stenosis. Electronically Signed   By: Rise Mu M.D.   On: 02/12/2023 02:54   CT HEAD CODE STROKE WO CONTRAST  Result Date: 02/11/2023 CLINICAL DATA:  Code stroke. EXAM: CT HEAD WITHOUT CONTRAST TECHNIQUE: Contiguous axial images were obtained from the base of the skull through the vertex without intravenous contrast. RADIATION DOSE REDUCTION: This exam was performed according to the departmental dose-optimization program which includes automated exposure control, adjustment of the mA and/or kV according to patient size and/or use of iterative reconstruction technique. COMPARISON:  Prior study from 10/20/2022 and earlier. FINDINGS: Brain: Cerebral volume within normal limits for age. Moderate chronic microvascular ischemic changes noted. No acute intracranial hemorrhage. No acute large vessel territory infarct. No mass lesion or midline shift. No hydrocephalus or extra-axial fluid collection. Vascular: No abnormal hyperdense vessel. Scattered calcified atherosclerosis present at the skull base. Skull: Scalp soft tissues and calvarium within normal limits. Sinuses/Orbits: Globes and orbital soft tissues demonstrate no acute finding. Paranasal sinuses are clear. No mastoid effusion. Other: None. ASPECTS [facility] Stroke Program Early CT Score) - Ganglionic level infarction (caudate, lentiform nuclei, internal capsule, insula, M1-M3 cortex): 7 - Supraganglionic infarction (M4-M6 cortex): 3 Total score (0-10 with 10 being normal): 10 IMPRESSION: 1. No acute intracranial abnormality. 2. ASPECTS is 10. 3. Moderately advanced chronic microvascular ischemic disease. These results  were communicated to Dr. Derry Lory at 9:01 pm on 02/11/2023 by text page via the [facility] messaging system. Electronically Signed   By: Rise Mu M.D.   On: 02/11/2023 21:02   CT Renal Stone Study  Result Date: 01/13/2023 CLINICAL DATA:  Abdominal and left flank pain. Kidney stones suspected. EXAM: CT ABDOMEN AND PELVIS WITHOUT CONTRAST TECHNIQUE: Multidetector CT imaging of the abdomen and pelvis was performed following the standard protocol without IV contrast. RADIATION DOSE REDUCTION: This exam was performed according to the departmental dose-optimization program which includes automated exposure control, adjustment of the mA and/or kV according to patient size and/or use of iterative reconstruction technique. COMPARISON:  01/24/2022 FINDINGS: Lower chest: Unremarkable. Hepatobiliary: No suspicious focal abnormality in the liver on this study without intravenous contrast. Calcified gallstones evident. No CT features of gallbladder wall thickening or pericholecystic fluid. No intrahepatic or extrahepatic biliary dilation. Pancreas: No focal mass lesion. No dilatation of the main duct. No intraparenchymal cyst. No peripancreatic edema. Spleen: No splenomegaly. No focal mass lesion. Adrenals/Urinary Tract: No adrenal nodule or mass. Kidneys unremarkable. No evidence for hydroureter. Left ureter inserts anterior bladder dome suggesting prior reimplantation the urinary bladder appears normal for the degree of distention. Stomach/Bowel: Stomach is unremarkable. No gastric wall thickening. No  evidence of outlet obstruction. Duodenum is normally positioned as is the ligament of Treitz. No small bowel wall thickening. No small bowel dilatation. The terminal ileum is normal. The appendix is normal. No gross colonic mass. No colonic wall thickening. Diverticular changes are noted in the left colon without evidence of diverticulitis. Rectal anastomosis noted. Vascular/Lymphatic: There is mild atherosclerotic  calcification of the abdominal aorta without aneurysm. 14 mm short axis portal caval node on 22/2 is stable in the interval, likely reactive. No retroperitoneal or mesenteric lymphadenopathy. No pelvic sidewall lymphadenopathy. Reproductive: Hysterectomy.  There is no adnexal mass. Other: No intraperitoneal free fluid. Musculoskeletal: Complex supraumbilical midline ventral hernia identified with multiple discrete fascial defects evident with fat containing hernia sacs associated. No worrisome lytic or sclerotic osseous abnormality. IMPRESSION: 1. No acute findings in the abdomen or pelvis. Specifically, no findings to explain the patient's history of left flank pain. 2. Cholelithiasis. 3. Left colonic diverticulosis without diverticulitis. 4. Complex midline ventral hernia with multiple discrete fascial defects evident with fat containing hernia sacs associated. 5.  Aortic Atherosclerosis (ICD10-I70.0). Electronically Signed   By: Kennith Center M.D.   On: 01/13/2023 16:44    Microbiology: Results for orders placed or performed during the hospital encounter of 01/13/23  Resp panel by RT-PCR (RSV, Flu A&B, Covid) Nasopharyngeal Swab     Status: None   Collection Time: 01/13/23  1:53 PM   Specimen: Nasopharyngeal Swab; Nasal Swab  Result Value Ref Range Status   SARS Coronavirus 2 by RT PCR NEGATIVE NEGATIVE Final    Comment: (NOTE) SARS-CoV-2 target nucleic acids are NOT DETECTED.  The SARS-CoV-2 RNA is generally detectable in upper respiratory specimens during the acute phase of infection. The lowest concentration of SARS-CoV-2 viral copies this assay can detect is 138 copies/mL. A negative result does not preclude SARS-Cov-2 infection and should not be used as the sole basis for treatment or other patient management decisions. A negative result may occur with  improper specimen collection/handling, submission of specimen other than nasopharyngeal swab, presence of viral mutation(s) within  the areas targeted by this assay, and inadequate number of viral copies(<138 copies/mL). A negative result must be combined with clinical observations, patient history, and epidemiological information. The expected result is Negative.  Fact Sheet for Patients:  BloggerCourse.com  Fact Sheet for Healthcare Providers:  SeriousBroker.it  This test is no t yet approved or cleared by the [place] FDA and  has been authorized for detection and/or diagnosis of SARS-CoV-2 by FDA under an Emergency Use Authorization (EUA). This EUA will remain  in effect (meaning this test can be used) for the duration of the COVID-19 declaration under Section 564(b)(1) of the Act, 21 U.S.C.section 360bbb-3(b)(1), unless the authorization is terminated  or revoked sooner.       Influenza A by PCR NEGATIVE NEGATIVE Final   Influenza B by PCR NEGATIVE NEGATIVE Final    Comment: (NOTE) The Xpert Xpress SARS-CoV-2/FLU/RSV plus assay is intended as an aid in the diagnosis of influenza from Nasopharyngeal swab specimens and should not be used as a sole basis for treatment. Nasal washings and aspirates are unacceptable for Xpert Xpress SARS-CoV-2/FLU/RSV testing.  Fact Sheet for Patients: BloggerCourse.com  Fact Sheet for Healthcare Providers: SeriousBroker.it  This test is not yet approved or cleared by the [place] FDA and has been authorized for detection and/or diagnosis of SARS-CoV-2 by FDA under an Emergency Use Authorization (EUA). This EUA will remain in effect (meaning this test can be used) for the  duration of the COVID-19 declaration under Section 564(b)(1) of the Act, 21 U.S.C. section 360bbb-3(b)(1), unless the authorization is terminated or revoked.     Resp Syncytial Virus by PCR NEGATIVE NEGATIVE Final    Comment: (NOTE) Fact Sheet for  Patients: BloggerCourse.com  Fact Sheet for Healthcare Providers: SeriousBroker.it  This test is not yet approved or cleared by the [place] FDA and has been authorized for detection and/or diagnosis of SARS-CoV-2 by FDA under an Emergency Use Authorization (EUA). This EUA will remain in effect (meaning this test can be used) for the duration of the COVID-19 declaration under Section 564(b)(1) of the Act, 21 U.S.C. section 360bbb-3(b)(1), unless the authorization is terminated or revoked.  Performed at [facility], [address], [place], [place] [address]     Labs: CBC: Recent Labs  Lab 02/11/23 2036 02/11/23 2048  WBC 10.3  --   NEUTROABS 5.8  --   HGB 14.1 13.9  HCT 41.3 41.0  MCV 89.6  --   PLT 250  --    Basic Metabolic Panel: Recent Labs  Lab 02/11/23 2036 02/11/23 2048 02/12/23 1010  NA 137 138 138  K 4.2 4.1 3.7  CL 101 106 103  CO2 23  --  25  GLUCOSE 123* 119* 175*  BUN 35* 37* 24*  CREATININE 1.60* 1.70* 1.22*  CALCIUM 9.2  --  9.2   Liver Function Tests: Recent Labs  Lab 02/11/23 2036  AST 24  ALT 19  ALKPHOS 51  BILITOT 0.7  PROT 6.8  ALBUMIN 3.5   CBG: Recent Labs  Lab 02/11/23 2341  GLUCAP 109*    Discharge time spent: greater than 30 minutes.  This record has been created using Conservation officer, historic buildings. Errors have been sought and corrected,but may not always be located. Such creation errors do not reflect on the standard of care.   Signed: Arnetha Courser, MD [facility] 02/12/2023

## 2023-02-12 NOTE — ED Notes (Signed)
Pt asleep denies any complaints at this time will continue to monitor

## 2023-02-12 NOTE — Evaluation (Signed)
Physical Therapy Evaluation Patient Details Name: [name] MRN: [id number] DOB: [birth year] Today's Date: 02/12/2023  History of Present Illness  82 y.o. female presents to [facility] hospital on 02/11/2023 with HA, numbness to L hand and face. CT and MRI head negative. PMH includes OA, carpal tunnel syndrome, DM, UTI, HLD, HTN, ICH.  Clinical Impression  Pt presents to PT at or near her baseline. Pt reports resolution of all symptoms. Pt is mobilizing independently at this time. PT recommends discharge home when medically ready. Acute PT signing off.       Recommendations for follow up therapy are one component of a multi-disciplinary discharge planning process, led by the attending physician.  Recommendations may be updated based on patient status, additional functional criteria and insurance authorization.  Follow Up Recommendations       Assistance Recommended at Discharge None  Patient can return home with the following       Equipment Recommendations None recommended by PT  Recommendations for Other Services       Functional Status Assessment Patient has not had a recent decline in their functional status     Precautions / Restrictions Precautions Precautions: None Restrictions Weight Bearing Restrictions: No      Mobility  Bed Mobility Overal bed mobility: Independent                  Transfers Overall transfer level: Independent                      Ambulation/Gait Ambulation/Gait assistance: Independent Gait Distance (Feet): 300 Feet Assistive device: None Gait Pattern/deviations: WFL(Within Functional Limits) Gait velocity: functional Gait velocity interpretation: >2.62 ft/sec, indicative of community ambulatory   General Gait Details: steady step-through gait  Stairs            Wheelchair Mobility    Modified Rankin (Stroke Patients Only)       Balance Overall balance assessment: Independent                                            Pertinent Vitals/Pain Pain Assessment Pain Assessment: No/denies pain    Home Living Family/patient expects to be discharged to:: Private residence Living Arrangements: Spouse/significant other Available Help at Discharge: Family;Available 24 hours/day Type of Home: House Home Access: Stairs to enter Entrance Stairs-Rails: None Entrance Stairs-Number of Steps: 3   Home Layout: One level Home Equipment: Rollator (4 wheels)      Prior Function Prior Level of Function : Independent/Modified Independent;Driving;Working/employed             Mobility Comments: owns a Environmental manager   Dominant Hand: Right    Extremity/Trunk Assessment   Upper Extremity Assessment Upper Extremity Assessment: Overall WFL for tasks assessed    Lower Extremity Assessment Lower Extremity Assessment: Overall WFL for tasks assessed    Cervical / Trunk Assessment Cervical / Trunk Assessment: Normal  Communication   Communication: No difficulties  Cognition Arousal/Alertness: Awake/alert Behavior During Therapy: WFL for tasks assessed/performed Overall Cognitive Status: Within Functional Limits for tasks assessed                                          General Comments General comments (skin  integrity, edema, etc.): VSS on RA    Exercises     Assessment/Plan    PT Assessment Patient does not need any further PT services  PT Problem List         PT Treatment Interventions      PT Goals (Current goals can be found in the Care Plan section)       Frequency       Co-evaluation               AM-PAC PT "6 Clicks" Mobility  Outcome Measure Help needed turning from your back to your side while in a flat bed without using bedrails?: None Help needed moving from lying on your back to sitting on the side of a flat bed without using bedrails?: None Help needed moving to and from a bed to a chair  (including a wheelchair)?: None Help needed standing up from a chair using your arms (e.g., wheelchair or bedside chair)?: None Help needed to walk in hospital room?: None Help needed climbing 3-5 steps with a railing? : None 6 Click Score: 24    End of Session   Activity Tolerance: Patient tolerated treatment well Patient left: in bed;with call bell/phone within reach Nurse Communication: Mobility status PT Visit Diagnosis: Other symptoms and signs involving the nervous system (R29.898)    Time: 2751-7001 PT Time Calculation (min) (ACUTE ONLY): 21 min   Charges:   PT Evaluation $PT Eval Low Complexity: 1 Low          Arlyss Gandy, PT, DPT Acute Rehabilitation Office [phone]   Arlyss Gandy 02/12/2023, 10:37 AM

## 2023-02-12 NOTE — H&P (Signed)
History and Physical    Patient: [name] ZOX:[id number] DOB: [birth year] DOA: 02/11/2023 DOS: the patient was seen and examined on 02/12/2023 PCP: Vania Rea, FNP  Patient coming from: Home  Chief Complaint:  Chief Complaint  Patient presents with   Numbness   HPI: [name] is a 82 y.o. female with medical history significant of HTN, DM2.  Pt had small occipital lobe non traumatic ICH in Dec, felt to be likely hypertensive bleed with.  Pt had headache a few days ago on 4/9.  This resolved spontaneously.  She was felling well until she woke up this AM (4/14) with numbness to L Hand.  Initially resolved but then reoccurred in afternoon but this time with L facial numbness and tongue numbness.  In to ED.  Symptoms improved, but not resolved.   Review of Systems: As mentioned in the history of present illness. All other systems reviewed and are negative. Past Medical History:  Diagnosis Date   Abdominal pain    Arthritis    cervical disc degeneration/ oa left knee, carpal tunnel rt wrist, adhesive capsulitis right shoulder, rt hand weakness; lumbar degeneration   Carpal tunnel syndrome    Complication of anesthesia 2006-at [facility]   breathing problems-no BP med given prior to surgery;  hx of being very sleepy after colon surgery --  states no problems with last right total knee replacement 2013   Constipation    Diabetes mellitus without complication    borderline - diet control   Diverticulitis hx of   Diverticulosis    E. coli infection    2020   Frequent UTI    hx of urethral injury during colon surgery - states frequent uti's since   GERD (gastroesophageal reflux disease)    H/O hiatal hernia    History of palpitations    in the past   History of shingles    has a lingering itching on back where shingles were   Hyperlipidemia    Hypertension    Numbness and tingling in right hand    pt. states has numbness of right hand very frequently-watch  positioning   Obesity    Osteoarthritis    Osteoporosis    Pain    pain left knee and pain right hip and right groin   Personal history of colonic polyps-adenoma 08/26/2008   Pneumonia 2005   Shortness of breath 09/14/2021   Vitamin D deficiency    Past Surgical History:  Procedure Laterality Date   ABDOMINAL HYSTERECTOMY     bladder tack     BREAST BIOPSY Left    BREAST SURGERY     breast duct resection- benign   CARPAL TUNNEL RELEASE Right 06/04/2014   Procedure: RIGHT CARPAL TUNNEL RELEASE;  Surgeon: Dominica Severin, MD;  Location: [facility];  Service: Orthopedics;  Laterality: Right;   COLON RESECTION  2008   hx diverticulosis   COLON SURGERY     COLONOSCOPY     colonoscopy [id number]     JOINT REPLACEMENT     OOPHORECTOMY     POLYPECTOMY     skin graft left arm - traumatic compression injury left upper arm     temporary ureter stent     TOTAL KNEE ARTHROPLASTY  01/05/2012   Procedure: TOTAL KNEE ARTHROPLASTY;  Surgeon: Loanne Drilling, MD;  Location: [facility];  Service: Orthopedics;  Laterality: Right;   TOTAL KNEE ARTHROPLASTY Left 08/17/2014   Procedure: LEFT TOTAL KNEE ARTHROPLASTY;  Surgeon: Homero Fellers  Dulcy Fanny, MD;  Location: [facility];  Service: Orthopedics;  Laterality: Left;   ureter repair for tyransected left ureter     Social History:  reports that she has never smoked. She has never used smokeless tobacco. She reports that she does not drink alcohol and does not use drugs.  Allergies  Allergen Reactions   Bee Venom Hives   Latex Hives and Itching   Codeine Other (See Comments)    Hallucinations    Hydrocodone Other (See Comments)    hallucinations   Oxycodone Other (See Comments)    hallucinations    Family History  Problem Relation Age of Onset   Breast cancer Mother 30   Hypertension Mother    Prostate cancer Father    Hypertension Father    Dementia Sister    Lung cancer Brother    Hypertension Brother    COPD Brother    Cancer  Maternal Grandmother    Arthritis Sister    Hypertension Sister    Arthritis Sister    Hypertension Child    Hypertension Child    Hypertension Child    Colon polyps Neg Hx    Esophageal cancer Neg Hx    Rectal cancer Neg Hx    Stomach cancer Neg Hx    Colon cancer Neg Hx     Prior to Admission medications   Medication Sig Start Date End Date Taking? Authorizing Provider  aspirin EC 81 MG tablet Take 81 mg by mouth daily. Swallow whole.   Yes [provider]  estradiol (ESTRACE) 0.1 MG/GM vaginal cream Place 1 Applicatorful vaginally 3 (three) times a week. 09/14/22  Yes [provider]  glipiZIDE (GLUCOTROL) 10 MG tablet Take 5 mg by mouth daily before breakfast. 09/02/22  Yes [provider]  hydrALAZINE (APRESOLINE) 10 MG tablet Take 10 mg by mouth daily. 01/15/23  Yes [provider]  Olmesartan-amLODIPine-HCTZ 40-10-12.5 MG TABS Take 1 tablet by mouth daily. 12/02/22  Yes [provider]  solifenacin (VESICARE) 5 MG tablet Take 5 mg by mouth daily as needed (for urinary retention). 02/09/22  Yes [provider]  torsemide (DEMADEX) 5 MG tablet Take 5 mg by mouth daily. 12/02/22  Yes [provider]  Blood Pressure KIT 1 Units by Does not apply route daily. Check blood pressure daily for hypertension dx code I10 09/14/21   Chilton Si, MD  Lancets [facility] DELICA PLUS [place]) MISC USE TO CHECK BLOOD SUGAR TWICE DAILY 12/24/20   Dorothyann Peng, MD  [facility] VERIO test strip USE TO TEST 2 TIMES DAILY 12/20/20   Dorothyann Peng, MD    Physical Exam: Vitals:   02/11/23 2300 02/11/23 2345 02/12/23 0015 02/12/23 0023  BP: (!) 122/58 (!) 119/58 (!) 150/57   Pulse: 63 64 70   Resp: 16 20 17    Temp:    98.2 F (36.8 C)  TempSrc:    Oral  SpO2: 93% 94% 97%   Weight:      Height:       Constitutional: NAD, calm, comfortable Respiratory: clear to auscultation bilaterally, no wheezing, no crackles. Normal respiratory  effort. No accessory muscle use.  Cardiovascular: Regular rate and rhythm, no murmurs / rubs / gallops. No extremity edema. 2+ pedal pulses. No carotid bruits.  Abdomen: no tenderness, no masses palpated. No hepatosplenomegaly. Bowel sounds positive.  Neurologic: CN 2-12 grossly intact. Sensation intact, DTR normal. Strength 5/5 in all 4.  Psychiatric: Normal judgment and insight. Alert and oriented x 3. Normal mood.  Data Reviewed:    CT head = no acute findings     Latest Ref Rng & Units 02/11/2023    8:48 PM 02/11/2023    8:36 PM 01/13/2023    4:11 PM  CMP  Glucose 70 - 99 mg/dL 409  811  59   BUN 8 - 23 mg/dL 37  35  25   Creatinine 0.44 - 1.00 mg/dL 9.14  7.82  9.56   Sodium 135 - 145 mmol/L 138  137  140   Potassium 3.5 - 5.1 mmol/L 4.1  4.2  3.7   Chloride 98 - 111 mmol/L 106  101  103   CO2 22 - 32 mmol/L  23  28   Calcium 8.9 - 10.3 mg/dL  9.2  21.3   Total Protein 6.5 - 8.1 g/dL  6.8  7.1   Total Bilirubin 0.3 - 1.2 mg/dL  0.7  0.8   Alkaline Phos 38 - 126 U/L  51  51   AST 15 - 41 U/L  24  20   ALT 0 - 44 U/L  19  18      Assessment and Plan: * TIA (transient ischemic attack) Pt with TIA like symptoms today. CT head today neg for bleed (in contrast to Dec when she had hypertensive bleed). Stroke pathway See neuro consult note Tele monitor 2d echo MRI brain Would rather not CTA in setting of AKI: MRA head Carotid dopplers PT/OT/SLP ASA 81 daily Plavix 75 for 21 days  AKI (acute kidney injury) Mild AKI with creat 1.6 up from 1.0 in March. Sees Dr. Thedore Mins at [facility] for CKD. Hold torsemide for the moment Hold olmesartan Hold HCTZ Repeat BMP in AM May want to contact nephro in AM if not rapidly improving.  Type 2 diabetes mellitus with stage 3 chronic kidney disease, without long-term current use of insulin Hold glipizide CBG checks while here  Essential hypertension Pt with suspected hypertensive bleed in Dec 2023. Dont think we can risk  permissive HTN here. Cont home amlodipine Increase home hydralazine to TID starting tomorrow (was taking once daily) Hold HCTZ Hold olmesartan Add PRN IV hydralazine for SBP > 160.      Advance Care Planning:   Code Status: Full Code  Consults: Dr. Derry Lory  Family Communication: Husband at bedside  Severity of Illness: The appropriate patient status for this patient is OBSERVATION. Observation status is judged to be reasonable and necessary in order to provide the required intensity of service to ensure the patient's safety. The patient's presenting symptoms, physical exam findings, and initial radiographic and laboratory data in the context of their medical condition is felt to place them at decreased risk for further clinical deterioration. Furthermore, it is anticipated that the patient will be medically stable for discharge from the hospital within 2 midnights of admission.   Author: Hillary Bow., DO 02/12/2023 12:35 AM  For on call review [email or web address].

## 2023-02-12 NOTE — Discharge Instructions (Signed)
Heart-Healthy Nutrition Therapy  A heart-healthy diet is recommended to reduce your unhealthy blood cholesterol levels, manage high blood pressure, and lower your risk for heart disease. To follow a heart-healthy diet, Eat a balanced diet with whole grains, fruits and vegetables, and lean protein sources. Achieve and maintain a healthy weight. Choose heart-healthy unsaturated fats. Limit saturated fats, trans fats, and cholesterol intake. Eat more plant-based or vegetarian meals using beans and soy foods for protein. Eat whole, unprocessed foods to limit the amount of sodium (salt) you eat. Limit refined carbohydrates especially sugar, sweets and sugar-sweetened beverages. If you drink alcohol, do so in moderation: one serving per day (women) and two servings per day (men). One serving is equivalent to 12 ounces beer, 5 ounces wine, or 1.5 ounces distilled spirits  Tips Tips for Choosing Heart-Healthy Fats Choose lean protein and low-fat dairy foods to reduce saturated fat intake. Saturated fat is usually found in animal-based protein and is associated with certain health risks. Saturated fat is the biggest contributor to raised low-density lipoprotein (LDL) cholesterol levels in the diet. Research shows that limiting saturated fat lowers unhealthy cholesterol levels. Eat no more than 5-6% of your total calories each day from saturated fat. Ask your registered dietitian nutritionist (RDN) to help you determine how much saturated fat is right for you. There are many foods that do not contain large amounts of saturated fats. Swapping these foods to replace foods high in saturated fats will help you limit the saturated fat you eat and improve your cholesterol levels. You can also try eating more plant-based or vegetarian meals. Instead of. Try:  Whole milk, cheese, yogurt, and ice cream 1%, %, or skim milk, low-fat cheese, non-fat yogurt, and low-fat ice cream  Fatty, marbled beef and pork Lean  beef, pork, or venison  Poultry with skin Poultry without skin  Butter, stick margarine Reduced-fat, whipped, or liquid spreads  Coconut oil, palm oil Liquid vegetable oils: corn, canola, olive, soybean and safflower oils   Avoid trans fats. Trans fats increase levels of LDL-cholesterol. Hydrogenated fat in processed foods is the main source of trans fats in foods.  Trans fats can be found in stick margarine, shortening, processed sweets, baked goods, some fried foods, and packaged foods made with hydrogenated oils. Avoid foods with "partially hydrogenated oil" on the ingredient list such as: cookies, pastries, baked goods, biscuits, crackers, microwave popcorn, and frozen dinners. Choose foods with heart healthy fats. Polyunsaturated and monounsaturated fat are unsaturated fats that may help lower your blood cholesterol level when used in place of saturated fat in your diet. Ask your RDN about taking a dietary supplement with plant sterols and stanols to help lower your cholesterol level. Research shows that substituting saturated fats with unsaturated fats is beneficial to cholesterol levels. Try these easy swaps:   Instead of. Try:  Butter, stick margarine, or solid shortening Reduced-fat, whipped, or liquid spreads  Beef, pork, or poultry with skin   Fish and seafood  Chips, crackers, snack foods Raw or unsalted nuts and seeds or nut butters Hummus with vegetables Avocado on toast  Coconut oil, palm oil Liquid vegetable oils: corn, canola, olive, soybean and safflower oils    Limit the amount of cholesterol you eat to less than 200 milligrams per day. Cholesterol is a substance carried through the bloodstream via lipoproteins, which are known as "transporters" of fat. Some body functions need cholesterol to work properly, but too much cholesterol in the bloodstream can damage arteries and build up blood   vessel linings (which can lead to heart attack and stroke). You should eat less than  200 milligrams cholesterol per day. People respond differently to eating cholesterol. There is no test available right now that can figure out which people will respond more to dietary cholesterol and which will respond less. For individuals with high intake of dietary cholesterol, different types of increase (none, small, moderate, large) in LDL-cholesterol levels are all possible.   Food sources of cholesterol include egg yolks and organ meats such as liver, gizzards.  Limit egg yolks to two to four per week and avoid organ meats like liver and gizzards to control cholesterol intake.  Tips for Choosing Heart-Healthy Carbohydrates Consume foods rich in viscous (soluble) fiber Viscous, or soluble, fiber is found in the walls of plant cells. Viscous fiber is found only in plant-based foods--animal-based foods like meat or dairy products do not contain fiber. In the stomach, viscous fibers absorb water and swell to form a thick, jelly-like mass. This helps to lower your unhealthy cholesterol Rich sources of viscous fiber include asparagus, Brussels sprouts, sweet potatoes, turnips, apricots, mangoes, oranges, legumes, barley, oats, and oat bran. Eat at least 5 to 10 grams of viscous fiber each day. As you increase your fiber intake gradually, also increase the amount of water you drink. This will help prevent constipation. If you have difficulty achieving this goal, ask your RDN about fiber laxatives. Choose fiber supplements made with viscous fibers such as psyllium seed husks or methylcellulose to help lower unhealthy cholesterol. Limit refined carbohydrates There are three types of carbohydrates: starches, sugar, and fiber. Some carbohydrates occur naturally in food, like the starches in rice or corn or the sugars in fruits and milk. Refined carbohydrates--foods with high amounts of simple sugars--can raise triglyceride levels. High triglyceride levels are associated with coronary heart disease. Some  examples of refined carbohydrate foods are table sugar, sweets, and beverages sweetened with added sugar. Tips for Reducing Sodium (Salt) Although sodium is important for your body to function, too much sodium can be harmful for people with high blood pressure.  As sodium and fluid buildup in your tissues and bloodstream, your blood pressure increases. High blood pressure may cause damage to other organs and increase your risk for a stroke. Keep your salt intake to 2300 milligrams or less per day. Even if you take a pill for blood pressure or a water pill (diuretic) to remove fluid, it is still important to have less salt in your diet. Ask your RDN what amount of sodium is right for you. Avoid processed foods.  Eat more fresh foods. Fresh fruits and vegetables are naturally low in sodium, as well as frozen vegetables and fruits that have no added juices or sauces. Fresh meats are lower in sodium than processed meats, such as bacon, sausage, and hotdogs.  Read the nutrition label or ask your butcher to help you find a fresh meat that is low in sodium. Eat less salt--at the table and when cooking. A single teaspoon of table salt has 2,300 mg of sodium. Leave the salt out of recipes for pasta, casseroles, and soups. Ask your RDN how to cook your favorite recipes without sodium Be a smart shopper. Look for food packages that say "salt-free" or "sodium-free." These items contain less than 5 milligrams of sodium per serving. "Very low-sodium" products contain less than 35 milligrams of sodium per serving. "Low-sodium" products contain less than 140 milligrams of sodium per serving.  Beware of "reduced salt" or "reduced   sodium" products.  These items may still be high in sodium. Check the nutrition label.  Add flavors to your food without adding sodium. Try lemon juice, lime juice, fruit juice or vinegar.   Dry or fresh herbs add flavor. Try basil, bay leaf, dill, rosemary, parsley, sage, dry mustard,  nutmeg, thyme, and paprika. Pepper, red pepper flakes, and cayenne pepper can add spice to your meals without adding sodium. Hot sauce contains sodium, but if you use just a drop or two, it will not add up to much. Buy a sodium-free seasoning blend or make your own at home.    Additional Lifestyle Tips Achieve and maintain a healthy weight. Talk with your RDN or your doctor about what is a healthy weight for you. Set goals to reach and maintain that weight.  To lose weight, reduce your calorie intake along with increasing your physical activity. A weight loss of 10 to 15 pounds could reduce LDL-cholesterol by 5 milligrams per deciliter. Participate in physical activity. Talk with your health care team to find out what types of physical activity are best for you. Set a plan to get about 30 minutes of exercise on most days.   Foods to Choose or to Limit Food Group Foods to Choose Foods to Limit  Grains Whole grain breads and cereals, including whole wheat, barley, rye, buckwheat, corn, teff, quinoa, millet, amaranth, brown or wild rice, sorghum, and oats Pasta, especially whole wheat or other whole grain types Brown rice, quinoa or wild rice Whole grain crackers, bread, rolls, pitas Home-made bread with reduced-sodium baking soda Breads or crackers topped with salt Cereals (hot or cold) with more than 300 mg sodium per serving Biscuits, cornbread, and other "quick" breads prepared with baking soda Bread crumbs or stuffing mix from a store High-fat bakery products, such as doughnuts, biscuits, croissants, danish pastries, pies, cookies Instant cooking foods to which you add hot water and stir--potatoes, noodles, rice, etc. Packaged starchy foods--seasoned noodle or rice dishes, stuffing mix, macaroni and cheese dinner Snacks made with partially hydrogenated oils, including chips, cheese puffs, snack mixes, regular crackers, butter-flavored popcorn  Protein Foods Lean cuts of beef and pork  (loin, leg, round, extra lean hamburger) Skinless poultry Fish Venison and other wild game Dried beans and peas Nuts and nut butters (unsalted) Seeds and seed butters (unsalted) Meat alternatives made with soy or textured vegetable protein Egg whites or egg substitute Cold cuts made with lean meat or soy protein Higher-fat cuts of meats (ribs, t-bone steak, regular hamburger) Bacon, sausage, or hot dogs Cold cuts, such as salami or bologna, deli meats, cured meats, corned beef Organ meats (liver, brains, gizzards, sweetbreads) Poultry with skin Fried or smoked meat, poultry, and fish Whole eggs and egg yolks (more than 2-4 per week) Salted legumes, nuts, seeds, or nut/seed butters Meat alternatives with high levels of sodium (>300 mg per serving) or saturated fat (>5 g per serving)  Dairy and Dairy Alternatives Nonfat (skim), low-fat, or 1%-fat milk Nonfat or low-fat yogurt or cottage cheese Fat-free and low-fat cheese Fortified non-dairy milk: almond, cashew, pea, and soy Whole milk, 2% fat milk, buttermilk Whole milk yogurt or ice cream Cream, half-&-half Cream cheese Sour cream Cheese  Vegetables Fresh, frozen, or canned vegetables without added fat or salt Avocados Canned or frozen vegetables with salt, fresh vegetables prepared with salt, butter, cheese, or cream sauce Fried vegetables Pickled vegetables such as olives, pickles, or sauerkraut  Fruits Fresh, frozen, canned, or dried fruit Fried fruits Fruits   served with butter or cream  Oils Unsaturated oils (corn, olive, peanut, soy, sunflower, canola) Soft or liquid margarines and vegetable oil spreads Salad dressings made from saturated fats Butter, stick margarine, shortening Partially hydrogenated oils or trans fats Tropical oils (coconut, palm, palm kernel oils)  Other Prepared or homemade foods, including soups, casseroles, and salads made from recommended ingredients and contain <600 mg sodium. Low-sodium seasonings  (ketchup, barbeque sauce) Spices, herbs, Salt-free seasoning mixes and marinades Vinegar Lemon or lime juice Prepared foods, including soups, casseroles, and salads made from recommended ingredients and contain >600 mg sodium. Frozen meals and prepared sides that are >600 mg of sodium Sugary and/or fatty desserts, candy, and other sweets Salts:  sea salt, kosher salt, onion salt, and garlic salt, seasoning mixes containing salt Flavorings: bouillon cubes, catsup or ketchup, barbeque sauce, Worcestershire sauce, soy sauce, salsa, relish, teriyaki sauce   Heart-Healthy Sample 1-Day Menu View Nutrient Info Breakfast 1 cup oatmeal 1 cup fat-free milk 1 cup blueberries 1 ounce almonds, unsalted 1 cup brewed coffee  Lunch 2 slices whole-wheat bread 2 ounces lean turkey breast 1 ounce low-fat Swiss cheese 2 slices tomato 2 lettuce leaves 1 pear 1 cup skim milk  Afternoon Snack 1 ounce trail mix with unsalted nuts, seeds, and raisins  Evening Meal 3 ounces broiled salmon 2/3 cup brown rice 1 teaspoon margarine, soft tub  cup broccoli, cooked  cup carrots, cooked 1 cup tossed salad 1 teaspoon olive oil and vinegar dressing 1 small whole-wheat roll 1 teaspoon margarine, soft tub 1 cup tea  Evening Snack 1 small banana  Daily Sum Nutrient Unit Value  Macronutrients  Energy kcal 2069  Energy kJ 8655  Protein g 146  Total lipid (fat) g 69  Carbohydrate, by difference g 228  Fiber, total dietary g 33  Sugars, total g 85  Minerals  Calcium, Ca mg 1292  Iron, Fe mg 14  Sodium, Na mg 1751  Vitamins  Vitamin C, total ascorbic acid mg 85  Vitamin A, IU IU 17756  Vitamin D IU 231  Lipids  Fatty acids, total saturated g 12  Fatty acids, total monounsaturated g 27  Fatty acids, total polyunsaturated g 24  Cholesterol mg 270     Heart-Healthy Vegan 1-Day Sample Menu View Nutrient Info Breakfast 1 slice whole wheat toast 2 tablespoons peanut butter without salt Tofu scramble  made with:  cup calcium-set tofu  cup green pepper  cup spinach  cup tomatoes  cup white mushrooms 1 teaspoon canola oil 1 cup soymilk fortified with calcium, vitamin B12, and vitamin D  Lunch 1 cup reduced sodium split pea soup 1 whole wheat dinner roll 1 medium apple  Dinner Salad made with: 1 cup lentils  cup cooked broccoli  cup cooked carrots 2 tablespoons hummus 1 cup sliced strawberries 1 cup soymilk fortified with calcium, vitamin B12, and vitamin D  Evening Snack 1 cup soy yogurt  cup mixed nuts  Daily Sum Nutrient Unit Value  Macronutrients  Energy kcal 1672  Energy kJ 7000  Protein g 86  Total lipid (fat) g 65  Carbohydrate, by difference g 205  Fiber, total dietary g 47  Sugars, total g 73  Minerals  Calcium, Ca mg 1443  Iron, Fe mg 19  Sodium, Na mg 1148  Vitamins  Vitamin C, total ascorbic acid mg 253  Vitamin A, IU IU 15451  Vitamin D IU 361  Lipids  Fatty acids, total saturated g 11  Fatty acids, total monounsaturated g   29  Fatty acids, total polyunsaturated g 19  Cholesterol mg 5     Heart-Healthy Vegetarian (Lacto-Ovo) Sample 1-Day Menu View Nutrient Info Breakfast 1 cup cooked oatmeal 1 tablespoon ground flaxseed 1 cup blueberries 2 scrambled egg whites with 1 teaspoon canola oil 2 tablespoons salsa 1 cup fat-free milk 1 cup coffee Oil, canola  Lunch 2 slices whole wheat bread 2 tablespoons peanut butter without salt 1 small banana 6 ounces fat-free plain yogurt 1 cup sliced red pepper 2 tablespoons hummus 1 cup fat-free milk  Evening Meal Stir fry made with:  cup tofu 1 cup brown rice  cup cooked broccoli  cup cooked carrots  cup cooked green beans 1 teaspoon peanut oil  Evening Snack 1 slice low-fat mozzarella cheese 1 medium apple  Daily Sum Nutrient Unit Value  Macronutrients  Energy kcal 1764  Energy kJ 7375  Protein g 87  Total lipid (fat) g 53  Carbohydrate, by difference g 254  Fiber, total dietary g 35   Sugars, total g 98  Minerals  Calcium, Ca mg 1609  Iron, Fe mg 12  Sodium, Na mg 1434  Vitamins  Vitamin C, total ascorbic acid mg 210  Vitamin A, IU IU 16317  Vitamin D IU 234  Lipids  Fatty acids, total saturated g 12  Fatty acids, total monounsaturated g 21  Fatty acids, total polyunsaturated g 15  Cholesterol mg 31    Copyright 2020  [employer]. All rights reserved   Additional Discharge Instructions   Please get your medications reviewed and adjusted by your Primary MD.  Please request your Primary MD to go over all Hospital Tests and Procedure/Radiological results at the follow up, please get all Hospital records sent to your Prim MD by signing hospital release before you go home.  If you had Pneumonia of Lung problems at the Hospital: Please get a 2 view Chest X ray done in approximately 4 weeks after hospital discharge or sooner if instructed by your Primary MD.  If you have Congestive Heart Failure: Please call your Cardiologist or Primary MD anytime you have any of the following symptoms:  1) 3 pound weight gain in 24 hours or 5 pounds in 1 week  2) shortness of breath, with or without a dry hacking cough  3) swelling in the hands, feet or stomach  4) if you have to sleep on extra pillows at night in order to breathe  Follow cardiac low salt diet and 1.5 lit/day fluid restriction.  If you have diabetes Accuchecks 4 times/day, Once in AM empty stomach and then before each meal. Log in all results and show them to your primary doctor at your next visit. If any glucose reading is under 80 or above 300 call your primary MD immediately.  If you have Seizure/Convulsions/Epilepsy: Please do not drive, operate heavy machinery, participate in activities at heights or participate in high speed sports until you have seen by Primary MD or a Neurologist and advised to do so again. Per [place] DMV statutes, patients with seizures are not  allowed to drive until they have been seizure-free for six months.  Use caution when using heavy equipment or power tools. Avoid working on ladders or at heights. Take showers instead of baths. Ensure the water temperature is not too high on the home water heater. Do not go swimming alone. Do not lock yourself in a room alone (i.e. bathroom). When caring for infants or small children, sit down when   holding, feeding, or changing them to minimize risk of injury to the child in the event you have a seizure. Maintain good sleep hygiene. Avoid alcohol.   If you had Gastrointestinal Bleeding: Please ask your Primary MD to check a complete blood count within one week of discharge or at your next visit. Your endoscopic/colonoscopic biopsies that are pending at the time of discharge, will also need to followed by your Primary MD.  Get Medicines reviewed and adjusted. Please take all your medications with you for your next visit with your Primary MD  Please request your Primary MD to go over all hospital tests and procedure/radiological results at the follow up, please ask your Primary MD to get all Hospital records sent to his/her office.  If you experience worsening of your admission symptoms, develop shortness of breath, life threatening emergency, suicidal or homicidal thoughts you must seek medical attention immediately by calling 911 or calling your MD immediately  if symptoms less severe.  You must read complete instructions/literature along with all the possible adverse reactions/side effects for all the Medicines you take and that have been prescribed to you. Take any new Medicines after you have completely understood and accpet all the possible adverse reactions/side effects.   Do not drive or operate heavy machinery when taking Pain medications.   Do not take more than prescribed Pain, Sleep and Anxiety Medications  Special Instructions: If you have smoked or chewed Tobacco  in the last 2 yrs  please stop smoking, stop any regular Alcohol  and or any Recreational drug use.  Wear Seat belts while driving.  Please note You were cared for by a hospitalist during your hospital stay. If you have any questions about your discharge medications or the care you received while you were in the hospital after you are discharged, you can call the unit and asked to speak with the hospitalist on call if the hospitalist that took care of you is not available. Once you are discharged, your primary care physician will handle any further medical issues. Please note that NO REFILLS for any discharge medications will be authorized once you are discharged, as it is imperative that you return to your primary care physician (or establish a relationship with a primary care physician if you do not have one) for your aftercare needs so that they can reassess your need for medications and monitor your lab values.  You can reach the hospitalist office at phone [phone] or fax [phone]   If you do not have a primary care physician, you can call [phone] for a physician referral.  

## 2023-02-12 NOTE — Hospital Course (Addendum)
Taken from H&P.   [name] is a 82 y.o. female with medical history significant of HTN, DM2.  Pt had small occipital lobe non traumatic ICH in Dec, felt to be likely hypertensive bleed presented with left-sided numbness, initially started around 4 AM on 4/14 involving left hand, resolved and then reoccurred in the afternoon with little expansion to left facial and tongue numbness.  ED course.  Hemodynamically stable, labs pertinent for AKI with creatinine at 1.6, baseline appears to be 1.06-1.2.  Imaging including CT head, MRI brain was negative for any acute abnormalities.  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 No tPA as she was out of window.  Neurology saw her and recommended aspirin and Plavix together for 21 days followed by aspirin alone.  Better blood pressure control.  4/15: Vital stable.  Lipid panel with significantly elevated cholesterol at 267, LDL 198 with goal less than 70.  Patient was started on statin.  A1c of 6.1. PT and OT with no recommendations. Echocardiogram with normal EF, mild aortic stenosis and no other significant abnormality US carotid with no significant stenosis. Repeat BMP with improvement of creatinine to 1.22.  Her home torsemide was stopped and she will continue the rest of her home medications and need to have a close follow-up with her primary care provider and neurologist for further recommendations and for better control of hypertension.

## 2023-02-12 NOTE — TOC Transition Note (Signed)
Transition of Care [facility]) - CM/SW Discharge Note   Patient Details  Name: [name] MRN: [id number] Date of Birth: [birth year]  [facility] CM/SW Contact:  Kermit Balo, RN Phone Number: 02/12/2023, 2:58 PM   Clinical Narrative:    Pt is discharging home with self care. No f/u per PT.  Pt has transportation home.   Final next level of care: Home/Self Care Barriers to Discharge: No Barriers Identified   Patient Goals and CMS Choice      Discharge Placement                         Discharge Plan and Services Additional resources added to the After Visit Summary for                                       Social Determinants of Health (SDOH) Interventions SDOH Screenings   Food Insecurity: No Food Insecurity (02/12/2023)  Housing: Low Risk  (02/12/2023)  Transportation Needs: No Transportation Needs (02/12/2023)  Utilities: Not At Risk (02/12/2023)  Alcohol Screen: Low Risk  (09/14/2021)  Depression (PHQ2-9): Low Risk  (05/19/2021)  Financial Resource Strain: Low Risk  (05/19/2021)  Physical Activity: Inactive (05/19/2021)  Social Connections: Socially Integrated (02/05/2019)  Stress: No Stress Concern Present (05/19/2021)  Tobacco Use: Low Risk  (02/11/2023)     Readmission Risk Interventions     No data to display

## 2023-02-12 NOTE — Progress Notes (Signed)
SLP Cancellation Note  Patient Details Name: [name] MRN: [id number] DOB: 19-Oct-1941   Cancelled treatment:       Reason Eval/Treat Not Completed: SLP screened, no needs identified, will sign off Pt did not present with any acute speech, language, or cognitive-linguistic deficits on admission, MRI was negative for acute changes, and no overt communication or cognitive-linguistic deficits were noted by physical therapy. A formal evaluation does not appear to be clinically indicated at this time. SLP will sign off.   Sawyer Kahan I. Vear Clock, MS, CCC-SLP Acute Rehabilitation Services Neuro Diagnostic Specialist Office number [phone]  Scheryl Marten 02/12/2023, 11:42 AM

## 2023-02-12 NOTE — Progress Notes (Signed)
VASCULAR LAB    Carotid duplex has been performed.  See CV proc for preliminary results.   Davontae Prusinski, RVT 02/12/2023, 11:58 AM

## 2023-02-12 NOTE — Progress Notes (Signed)
Completed discharge education with patient. Pt reports understanding education.

## 2023-02-12 NOTE — ED Notes (Signed)
ED TO INPATIENT HANDOFF REPORT  ED Nurse Name and Phone #: . Donnella Bi [phone] S Name/Age/Gender [name] 82 y.o. female Room/Bed: 038C/038C  Code Status   Code Status: Full Code  Home/SNF/Other Home Patient oriented to: self, place, time, and situation Is this baseline? Yes   Triage Complete: Triage complete  Chief Complaint TIA (transient ischemic attack) [G45.9]  Triage Note Pt arrives via POV from home, sudden onset of facial numbness, lip swelling, possible left facial droop, left arm weakness bilateral hand tingling. No arm drift, unilateral weakness noted. LSN 5pm per husband. Awake, alert, appropriate at present.    Allergies Allergies  Allergen Reactions   Bee Venom Hives   Latex Hives and Itching   Codeine Other (See Comments)    Hallucinations    Hydrocodone Other (See Comments)    hallucinations   Oxycodone Other (See Comments)    hallucinations    Level of Care/Admitting Diagnosis ED Disposition     ED Disposition  Admit   Condition  --   Comment  Hospital Area: [facility] [[id number]]  Level of Care: Telemetry Medical [104]  May place patient in observation at [facility] or [facility] if equivalent level of care is available:: No  Covid Evaluation: Asymptomatic - no recent exposure (last 10 days) testing not required  Diagnosis: TIA (transient ischemic attack) [454098]  Admitting Physician: Hillary Bow [[id number]]  Attending Physician: Hillary Bow [[id number]]          B Medical/Surgery History Past Medical History:  Diagnosis Date   Abdominal pain    Arthritis    cervical disc degeneration/ oa left knee, carpal tunnel rt wrist, adhesive capsulitis right shoulder, rt hand weakness; lumbar degeneration   Carpal tunnel syndrome    Complication of anesthesia 2006-at [facility]   breathing problems-no BP med given prior to surgery;  hx of being very sleepy after colon surgery --  states no problems with last right  total knee replacement 2013   Constipation    Diabetes mellitus without complication    borderline - diet control   Diverticulitis hx of   Diverticulosis    E. coli infection    2020   Frequent UTI    hx of urethral injury during colon surgery - states frequent uti's since   GERD (gastroesophageal reflux disease)    H/O hiatal hernia    History of palpitations    in the past   History of shingles    has a lingering itching on back where shingles were   Hyperlipidemia    Hypertension    Numbness and tingling in right hand    pt. states has numbness of right hand very frequently-watch positioning   Obesity    Osteoarthritis    Osteoporosis    Pain    pain left knee and pain right hip and right groin   Personal history of colonic polyps-adenoma 08/26/2008   Pneumonia 2005   Shortness of breath 09/14/2021   Vitamin D deficiency    Past Surgical History:  Procedure Laterality Date   ABDOMINAL HYSTERECTOMY     bladder tack     BREAST BIOPSY Left    BREAST SURGERY     breast duct resection- benign   CARPAL TUNNEL RELEASE Right 06/04/2014   Procedure: RIGHT CARPAL TUNNEL RELEASE;  Surgeon: Dominica Severin, MD;  Location: [facility];  Service: Orthopedics;  Laterality: Right;   COLON RESECTION  2008   hx diverticulosis  COLON SURGERY     COLONOSCOPY     colonoscopy [id number]     JOINT REPLACEMENT     OOPHORECTOMY     POLYPECTOMY     skin graft left arm - traumatic compression injury left upper arm     temporary ureter stent     TOTAL KNEE ARTHROPLASTY  01/05/2012   Procedure: TOTAL KNEE ARTHROPLASTY;  Surgeon: Loanne Drilling, MD;  Location: [facility];  Service: Orthopedics;  Laterality: Right;   TOTAL KNEE ARTHROPLASTY Left 08/17/2014   Procedure: LEFT TOTAL KNEE ARTHROPLASTY;  Surgeon: Loanne Drilling, MD;  Location: [facility];  Service: Orthopedics;  Laterality: Left;   ureter repair for tyransected left ureter       A IV Location/Drains/Wounds Patient  Lines/Drains/Airways Status     Active Line/Drains/Airways     Name Placement date Placement time Site Days   Peripheral IV 02/11/23 20 G Anterior;Right Forearm 02/11/23  2115  Forearm  1            Intake/Output Last 24 hours No intake or output data in the 24 hours ending 02/12/23 1231  Labs/Imaging Results for orders placed or performed during the hospital encounter of 02/11/23 (from the past 48 hour(s))  Protime-INR     Status: None   Collection Time: 02/11/23  8:36 PM  Result Value Ref Range   Prothrombin Time 13.3 11.4 - 15.2 seconds   INR 1.0 0.8 - 1.2    Comment: (NOTE) INR goal varies based on device and disease states. Performed at [facility], [address], [place], [place] [address]   APTT     Status: None   Collection Time: 02/11/23  8:36 PM  Result Value Ref Range   aPTT 31 24 - 36 seconds    Comment: Performed at [facility], [address], [place], [place] [address]  CBC     Status: None   Collection Time: 02/11/23  8:36 PM  Result Value Ref Range   WBC 10.3 4.0 - 10.5 K/uL   RBC 4.61 3.87 - 5.11 MIL/uL   Hemoglobin 14.1 12.0 - 15.0 g/dL   HCT 09.8 11.9 - 14.7 %   MCV 89.6 80.0 - 100.0 fL   MCH 30.6 26.0 - 34.0 pg   MCHC 34.1 30.0 - 36.0 g/dL   RDW 82.9 56.2 - 13.0 %   Platelets 250 150 - 400 K/uL   nRBC 0.0 0.0 - 0.2 %    Comment: Performed at [facility], [address], [place], [place] [address]  Differential     Status: Abnormal   Collection Time: 02/11/23  8:36 PM  Result Value Ref Range   Neutrophils Relative % 56 %   Neutro Abs 5.8 1.7 - 7.7 K/uL   Lymphocytes Relative 30 %   Lymphs Abs 3.1 0.7 - 4.0 K/uL   Monocytes Relative 8 %   Monocytes Absolute 0.8 0.1 - 1.0 K/uL   Eosinophils Relative 4 %   Eosinophils Absolute 0.4 0.0 - 0.5 K/uL   Basophils Relative 1 %   Basophils Absolute 0.1 0.0 - 0.1 K/uL   Immature Granulocytes 1 %   Abs Immature Granulocytes 0.12 (H) 0.00 - 0.07 K/uL    Comment: Performed at  [facility], [address], [place], [place] [address]  Comprehensive metabolic panel     Status: Abnormal   Collection Time: 02/11/23  8:36 PM  Result Value Ref Range   Sodium 137  135 - 145 mmol/L   Potassium 4.2 3.5 - 5.1 mmol/L   Chloride 101 98 - 111 mmol/L   CO2 23 22 - 32 mmol/L   Glucose, Bld 123 (H) 70 - 99 mg/dL    Comment: Glucose reference range applies only to samples taken after fasting for at least 8 hours.   BUN 35 (H) 8 - 23 mg/dL   Creatinine, Ser 1.61 (H) 0.44 - 1.00 mg/dL   Calcium 9.2 8.9 - 09.6 mg/dL   Total Protein 6.8 6.5 - 8.1 g/dL   Albumin 3.5 3.5 - 5.0 g/dL   AST 24 15 - 41 U/L   ALT 19 0 - 44 U/L   Alkaline Phosphatase 51 38 - 126 U/L   Total Bilirubin 0.7 0.3 - 1.2 mg/dL   GFR, Estimated 32 (L) >60 mL/min    Comment: (NOTE) Calculated using the CKD-EPI Creatinine Equation (2021)    Anion gap 13 5 - 15    Comment: Performed at [facility], [address] [place], [place] [address]  Ethanol     Status: None   Collection Time: 02/11/23  8:36 PM  Result Value Ref Range   Alcohol, Ethyl (B) <10 <10 mg/dL    Comment: (NOTE) Lowest detectable limit for serum alcohol is 10 mg/dL.  For medical purposes only. Performed at [facility], [address], [place], [place] [address]   I-stat chem 8, ED     Status: Abnormal   Collection Time: 02/11/23  8:48 PM  Result Value Ref Range   Sodium 138 135 - 145 mmol/L   Potassium 4.1 3.5 - 5.1 mmol/L   Chloride 106 98 - 111 mmol/L   BUN 37 (H) 8 - 23 mg/dL   Creatinine, Ser 1.47 (H) 0.44 - 1.00 mg/dL   Glucose, Bld 829 (H) 70 - 99 mg/dL    Comment: Glucose reference range applies only to samples taken after fasting for at least 8 hours.   Calcium, Ion 1.10 (L) 1.15 - 1.40 mmol/L   TCO2 26 22 - 32 mmol/L   Hemoglobin 13.9 12.0 - 15.0 g/dL   HCT 56.2 13.0 - 86.5 %  CBG monitoring, ED     Status: Abnormal   Collection Time: 02/11/23 11:41 PM  Result Value Ref Range   Glucose-Capillary  109 (H) 70 - 99 mg/dL    Comment: Glucose reference range applies only to samples taken after fasting for at least 8 hours.  Lipid panel     Status: Abnormal   Collection Time: 02/12/23  4:15 AM  Result Value Ref Range   Cholesterol 267 (H) 0 - 200 mg/dL   Triglycerides 784 (H) <150 mg/dL   HDL 37 (L) >69 mg/dL   Total CHOL/HDL Ratio 7.2 RATIO   VLDL 32 0 - 40 mg/dL   LDL Cholesterol 629 (H) 0 - 99 mg/dL    Comment:        Total Cholesterol/HDL:CHD Risk Coronary Heart Disease Risk Table                     Men   Women  1/2 Average Risk   3.4   3.3  Average Risk       5.0   4.4  2 X Average Risk   9.6   7.1  3 X Average Risk  23.4   11.0        Use the calculated Patient Ratio above and the CHD Risk Table to  determine the patient's CHD Risk.        ATP III CLASSIFICATION (LDL):  <100     mg/dL   Optimal  884-166  mg/dL   Near or Above                    Optimal  130-159  mg/dL   Borderline  063-016  mg/dL   High  >010     mg/dL   Very High Performed at [facility], [address], [place], [place] [address]   Hemoglobin A1c     Status: Abnormal   Collection Time: 02/12/23  4:15 AM  Result Value Ref Range   Hgb A1c MFr Bld 6.1 (H) 4.8 - 5.6 %    Comment: (NOTE) Pre diabetes:          5.7%-6.4%  Diabetes:              >6.4%  Glycemic control for   <7.0% adults with diabetes    Mean Plasma Glucose 128.37 mg/dL    Comment: Performed at [facility], [address], [place], [place] [address]  Basic metabolic panel     Status: Abnormal   Collection Time: 02/12/23 10:10 AM  Result Value Ref Range   Sodium 138 135 - 145 mmol/L   Potassium 3.7 3.5 - 5.1 mmol/L   Chloride 103 98 - 111 mmol/L   CO2 25 22 - 32 mmol/L   Glucose, Bld 175 (H) 70 - 99 mg/dL    Comment: Glucose reference range applies only to samples taken after fasting for at least 8 hours.   BUN 24 (H) 8 - 23 mg/dL   Creatinine, Ser 0.25 (H) 0.44 - 1.00 mg/dL   Calcium 9.2 8.9 - 42.7 mg/dL    GFR, Estimated 45 (L) >60 mL/min    Comment: (NOTE) Calculated using the CKD-EPI Creatinine Equation (2021)    Anion gap 10 5 - 15    Comment: Performed at [facility], [address] [place], [place] [address]   VAS US CAROTID  Result Date: 02/12/2023 Carotid Arterial Duplex Study Patient Name:  YANIKA CHANNEL  Date of Exam:   02/12/2023 Medical Rec #: [id number]         Accession #:    [id number] Date of Birth: November 29, 1940          Patient Gender: F Patient Age:   89 years Exam Location:  [facility] Procedure:      VAS US CAROTID Referring Phys: Lyda Perone --------------------------------------------------------------------------------  Indications:       TIA and Numbness. Risk Factors:      Hypertension, hyperlipidemia, Diabetes. Limitations        Today's exam was limited due to the body habitus of the                    patient and the patient's respiratory variation. Comparison Study:  Prior study done 10/12/21 indicating 1-39% ICA stenosis,                    right ECA >50% Performing Technologist: Sherren Kerns RVS  Examination Guidelines: A complete evaluation includes B-mode imaging, spectral Doppler, color Doppler, and power Doppler as needed of all accessible portions of each vessel. Bilateral testing is considered an integral part of a complete examination. Limited examinations for reoccurring indications may be performed as noted.  Right Carotid Findings: +----------+--------+--------+--------+------------------+------------------+  PSV cm/sEDV cm/sStenosisPlaque DescriptionComments           +----------+--------+--------+--------+------------------+------------------+ CCA Prox  74      11                                intimal thickening +----------+--------+--------+--------+------------------+------------------+ CCA Distal46      10                                intimal thickening  +----------+--------+--------+--------+------------------+------------------+ ICA Prox  107     17      1-39%   heterogenous                         +----------+--------+--------+--------+------------------+------------------+ ICA Mid   104     24                                                   +----------+--------+--------+--------+------------------+------------------+ ICA Distal98      20                                                   +----------+--------+--------+--------+------------------+------------------+ ECA       322     14      >50%                                         +----------+--------+--------+--------+------------------+------------------+ +----------+--------+-------+--------+-------------------+           PSV cm/sEDV cmsDescribeArm Pressure (mmHG) +----------+--------+-------+--------+-------------------+ [id number]                                        +----------+--------+-------+--------+-------------------+ +---------+--------+--+--------+--+ VertebralPSV cm/s64EDV cm/s13 +---------+--------+--+--------+--+  Left Carotid Findings: +----------+--------+--------+--------+------------------+------------------+           PSV cm/sEDV cm/sStenosisPlaque DescriptionComments           +----------+--------+--------+--------+------------------+------------------+ CCA Prox  87      17                                intimal thickening +----------+--------+--------+--------+------------------+------------------+ CCA Distal76      16                                intimal thickening +----------+--------+--------+--------+------------------+------------------+ ICA Prox  144     29      1-39%   heterogenous                         +----------+--------+--------+--------+------------------+------------------+ ICA Mid   160     29                                                    +----------+--------+--------+--------+------------------+------------------+  ICA Distal164     29                                                   +----------+--------+--------+--------+------------------+------------------+ ECA       50      6                                                    +----------+--------+--------+--------+------------------+------------------+ +----------+--------+--------+--------+-------------------+           PSV cm/sEDV cm/sDescribeArm Pressure (mmHG) +----------+--------+--------+--------+-------------------+ Subclavian109                                         +----------+--------+--------+--------+-------------------+ +---------+--------+--+--------+--+ VertebralPSV cm/s69EDV cm/s11 +---------+--------+--+--------+--+   Summary: Right Carotid: Velocities in the right ICA are consistent with a 1-39% stenosis. Left Carotid: Velocities in the left ICA are consistent with a 1-39% stenosis. Vertebrals:  Bilateral vertebral arteries demonstrate antegrade flow. Subclavians: Normal flow hemodynamics were seen in bilateral subclavian              arteries. *See table(s) above for measurements and observations.     Preliminary    ECHOCARDIOGRAM COMPLETE  Result Date: 02/12/2023    ECHOCARDIOGRAM REPORT   Patient Name:   KIRRA VERGA Date of Exam: 02/12/2023 Medical Rec #:  [id number]        Height:       66.0 in Accession #:    [id number]       Weight:       229.0 lb Date of Birth:  1941-04-23         BSA:          2.118 m Patient Age:    81 years         BP:           121/53 mmHg Patient Gender: F                HR:           68 bpm. Exam Location:  Inpatient Procedure: 2D Echo Indications:    TIA  History:        Patient has prior history of Echocardiogram examinations, most                 recent 04/18/2022.  Sonographer:    [name] RDCS Referring Phys: [phone] JARED M GARDNER IMPRESSIONS  1. Left ventricular ejection fraction, by estimation,  is 50 to 55%. The left ventricle has low normal function. The left ventricle has no regional wall motion abnormalities. Left ventricular diastolic function could not be evaluated.  2. Right ventricular systolic function is normal. The right ventricular size is normal. Tricuspid regurgitation signal is inadequate for assessing PA pressure.  3. The mitral valve is normal in structure. No evidence of mitral valve regurgitation. No evidence of mitral stenosis. Severe mitral annular calcification.  4. The aortic valve is calcified. Aortic valve regurgitation is not visualized. Mild aortic valve stenosis. Aortic valve area, by VTI measures 1.69 cm. Aortic valve mean gradient measures 14.0 mmHg. Aortic valve Vmax measures 2.58 m/s.  5. The  inferior vena cava is normal in size with greater than 50% respiratory variability, suggesting right atrial pressure of 3 mmHg. FINDINGS  Left Ventricle: Left ventricular ejection fraction, by estimation, is 50 to 55%. The left ventricle has low normal function. The left ventricle has no regional wall motion abnormalities. The left ventricular internal cavity size was normal in size. There is no left ventricular hypertrophy. Left ventricular diastolic function could not be evaluated due to mitral annular calcification (moderate or greater). Left ventricular diastolic function could not be evaluated. Right Ventricle: The right ventricular size is normal. No increase in right ventricular wall thickness. Right ventricular systolic function is normal. Tricuspid regurgitation signal is inadequate for assessing PA pressure. Left Atrium: Left atrial size was normal in size. Right Atrium: Right atrial size was normal in size. Pericardium: There is no evidence of pericardial effusion. Presence of epicardial fat layer. Mitral Valve: The mitral valve is normal in structure. Severe mitral annular calcification. No evidence of mitral valve regurgitation. No evidence of mitral valve stenosis. MV peak  gradient, 9.2 mmHg. The mean mitral valve gradient is 4.0 mmHg. Tricuspid Valve: The tricuspid valve is normal in structure. Tricuspid valve regurgitation is trivial. No evidence of tricuspid stenosis. Aortic Valve: The aortic valve is calcified. Aortic valve regurgitation is not visualized. Mild aortic stenosis is present. Aortic valve mean gradient measures 14.0 mmHg. Aortic valve peak gradient measures 26.6 mmHg. Aortic valve area, by VTI measures 1.69 cm. Pulmonic Valve: The pulmonic valve was not well visualized. Pulmonic valve regurgitation is trivial. No evidence of pulmonic stenosis. Aorta: The aortic root is normal in size and structure. Venous: The inferior vena cava is normal in size with greater than 50% respiratory variability, suggesting right atrial pressure of 3 mmHg. IAS/Shunts: No atrial level shunt detected by color flow Doppler.  LEFT VENTRICLE PLAX 2D LVIDd:         4.70 cm   Diastology LVIDs:         2.80 cm   LV e' medial:    6.42 cm/s LV PW:         1.10 cm   LV E/e' medial:  15.0 LV IVS:        1.00 cm   LV e' lateral:   5.87 cm/s LVOT diam:     2.20 cm   LV E/e' lateral: 16.4 LV SV:         92 LV SV Index:   43 LVOT Area:     3.80 cm  RIGHT VENTRICLE             IVC RV Basal diam:  2.70 cm     IVC diam: 1.70 cm RV S prime:     12.60 cm/s TAPSE (M-mode): 2.4 cm LEFT ATRIUM             Index        RIGHT ATRIUM           Index LA diam:        3.20 cm 1.51 cm/m   RA Area:     10.40 cm LA Vol (A2C):   74.3 ml 35.08 ml/m  RA Volume:   20.40 ml  9.63 ml/m LA Vol (A4C):   68.6 ml 32.38 ml/m LA Biplane Vol: 76.8 ml 36.26 ml/m  AORTIC VALVE AV Area (Vmax):    1.58 cm AV Area (Vmean):   1.60 cm AV Area (VTI):     1.69 cm AV Vmax:  258.00 cm/s AV Vmean:          174.000 cm/s AV VTI:            0.542 m AV Peak Grad:      26.6 mmHg AV Mean Grad:      14.0 mmHg LVOT Vmax:         107.00 cm/s LVOT Vmean:        73.100 cm/s LVOT VTI:          0.242 m LVOT/AV VTI ratio: 0.45  AORTA Ao  Root diam: 3.20 cm Ao Asc diam:  3.20 cm MITRAL VALVE MV Area (PHT): 2.66 cm     SHUNTS MV Area VTI:   2.14 cm     Systemic VTI:  0.24 m MV Peak grad:  9.2 mmHg     Systemic Diam: 2.20 cm MV Mean grad:  4.0 mmHg MV Vmax:       1.52 m/s MV Vmean:      97.1 cm/s MV Decel Time: 285 msec MV E velocity: 96.10 cm/s MV A velocity: 152.00 cm/s MV E/A ratio:  0.63 Kardie Tobb DO Electronically signed by Thomasene Ripple DO Signature Date/Time: 02/12/2023/10:53:21 AM    Final    DG CHEST PORT 1 VIEW  Result Date: 02/12/2023 CLINICAL DATA:  Altered mental status. EXAM: PORTABLE CHEST 1 VIEW COMPARISON:  Chest x-ray dated October 16, 2022. FINDINGS: The heart size and mediastinal contours are within normal limits. Chronic eventration of the right hemidiaphragm. Both lungs are clear. The visualized skeletal structures are unremarkable. IMPRESSION: No active disease. Electronically Signed   By: Obie Dredge M.D.   On: 02/12/2023 09:58   MR BRAIN WO CONTRAST  Result Date: 02/12/2023 CLINICAL DATA:  Initial evaluation for neuro deficit, stroke. EXAM: MRI HEAD WITHOUT CONTRAST MRA HEAD WITHOUT CONTRAST TECHNIQUE: Multiplanar, multi-echo pulse sequences of the brain and surrounding structures were acquired without intravenous contrast. Angiographic images of the Circle of Willis were acquired using MRA technique without intravenous contrast. COMPARISON:  Prior CT from 02/11/2023 and MRI from 10/18/2022. FINDINGS: MRI HEAD FINDINGS Brain: Cerebral volume within normal limits for age. Patchy T2/FLAIR hyperintensity involving the periventricular deep white matter both cerebral hemispheres, consistent with chronic small vessel ischemic disease, moderately advanced in nature. No abnormal foci of restricted diffusion to suggest acute or subacute ischemia. Gray-white matter differentiation maintained. No areas of chronic cortical infarction. No acute intracranial hemorrhage. Multiple scattered chronic micro hemorrhages again noted,  most pronounced about the parieto-occipital regions bilaterally. Susceptibility artifact related to previous hemorrhage seen at the right occipital lobe noted. While these findings are nonspecific as above these are favored to be hypertensive in nature. No mass lesion, midline shift or mass effect. No hydrocephalus or extra-axial fluid collection. Empty sella noted. Vascular: Major intracranial vascular flow voids are maintained. Skull and upper cervical spine: Minimal cerebellar tonsillar ectopia without Chiari malformation. Degenerative thickening noted at the tectorial membrane. Bone marrow signal intensity within normal limits. No scalp soft tissue abnormality. Sinuses/Orbits: Patient status post bilateral ocular lens replacement. Globes and orbital soft tissues demonstrate no acute finding. Paranasal sinuses are clear. No mastoid effusion. Other: None. MRA HEAD FINDINGS Anterior circulation: Visualized distal cervical segments of the internal carotid arteries are patent with antegrade flow. Petrous segments patent bilaterally. Mild atheromatous irregularity seen about the carotid siphons without hemodynamically significant stenosis or other abnormality. A1 segments patent bilaterally. Normal anterior communicating complex. Both ACAs are patent without significant stenosis. No M1 stenosis or occlusion. No proximal MCA  branch occlusion or high-grade stenosis. Distal MCA branches perfused and symmetric. Mild scattered small vessel atheromatous irregularity noted. Posterior circulation: Visualized V4 segments widely patent. Right vertebral artery dominant. Neither PICA origin visualized. Basilar patent without stenosis. Superior cerebral arteries patent bilaterally. Both PCAs primarily supplied via the basilar. Mild atheromatous irregularity about the PCAs without hemodynamically significant stenosis. PCAs remain patent to their distal aspects. Anatomic variants: None significant.  No intracranial aneurysm.  IMPRESSION: MRI HEAD IMPRESSION: 1. No acute intracranial infarct or other abnormality. 2. 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 3. Multiple scattered chronic micro hemorrhages, most pronounced about the parieto-occipital regions bilaterally. Findings are favored to be hypertensive in nature. MRA HEAD IMPRESSION: 1. Negative intracranial MRA for large vessel occlusion. 2. Mild intracranial atheromatous disease, but no hemodynamically significant or correctable stenosis. Electronically Signed   By: Rise Mu M.D.   On: 02/12/2023 02:54   MR ANGIO HEAD WO CONTRAST  Result Date: 02/12/2023 CLINICAL DATA:  Initial evaluation for neuro deficit, stroke. EXAM: MRI HEAD WITHOUT CONTRAST MRA HEAD WITHOUT CONTRAST TECHNIQUE: Multiplanar, multi-echo pulse sequences of the brain and surrounding structures were acquired without intravenous contrast. Angiographic images of the Circle of Willis were acquired using MRA technique without intravenous contrast. COMPARISON:  Prior CT from 02/11/2023 and MRI from 10/18/2022. FINDINGS: MRI HEAD FINDINGS Brain: Cerebral volume within normal limits for age. Patchy T2/FLAIR hyperintensity involving the periventricular deep white matter both cerebral hemispheres, consistent with chronic small vessel ischemic disease, moderately advanced in nature. No abnormal foci of restricted diffusion to suggest acute or subacute ischemia. Gray-white matter differentiation maintained. No areas of chronic cortical infarction. No acute intracranial hemorrhage. Multiple scattered chronic micro hemorrhages again noted, most pronounced about the parieto-occipital regions bilaterally. Susceptibility artifact related to previous hemorrhage seen at the right occipital lobe noted. While these findings are nonspecific as above these are favored to be hypertensive in nature. No mass lesion, midline shift or mass effect. No hydrocephalus or extra-axial fluid collection.  Empty sella noted. Vascular: Major intracranial vascular flow voids are maintained. Skull and upper cervical spine: Minimal cerebellar tonsillar ectopia without Chiari malformation. Degenerative thickening noted at the tectorial membrane. Bone marrow signal intensity within normal limits. No scalp soft tissue abnormality. Sinuses/Orbits: Patient status post bilateral ocular lens replacement. Globes and orbital soft tissues demonstrate no acute finding. Paranasal sinuses are clear. No mastoid effusion. Other: None. MRA HEAD FINDINGS Anterior circulation: Visualized distal cervical segments of the internal carotid arteries are patent with antegrade flow. Petrous segments patent bilaterally. Mild atheromatous irregularity seen about the carotid siphons without hemodynamically significant stenosis or other abnormality. A1 segments patent bilaterally. Normal anterior communicating complex. Both ACAs are patent without significant stenosis. No M1 stenosis or occlusion. No proximal MCA branch occlusion or high-grade stenosis. Distal MCA branches perfused and symmetric. Mild scattered small vessel atheromatous irregularity noted. Posterior circulation: Visualized V4 segments widely patent. Right vertebral artery dominant. Neither PICA origin visualized. Basilar patent without stenosis. Superior cerebral arteries patent bilaterally. Both PCAs primarily supplied via the basilar. Mild atheromatous irregularity about the PCAs without hemodynamically significant stenosis. PCAs remain patent to their distal aspects. Anatomic variants: None significant.  No intracranial aneurysm. IMPRESSION: MRI HEAD IMPRESSION: 1. No acute intracranial infarct or other abnormality. 2. 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 3. Multiple scattered chronic micro hemorrhages, most pronounced about the parieto-occipital regions bilaterally. Findings are favored to be hypertensive in nature. MRA HEAD IMPRESSION: 1. Negative  intracranial MRA for large vessel occlusion. 2.  Mild intracranial atheromatous disease, but no hemodynamically significant or correctable stenosis. Electronically Signed   By: Rise Mu M.D.   On: 02/12/2023 02:54   CT HEAD CODE STROKE WO CONTRAST  Result Date: 02/11/2023 CLINICAL DATA:  Code stroke. EXAM: CT HEAD WITHOUT CONTRAST TECHNIQUE: Contiguous axial images were obtained from the base of the skull through the vertex without intravenous contrast. RADIATION DOSE REDUCTION: This exam was performed according to the departmental dose-optimization program which includes automated exposure control, adjustment of the mA and/or kV according to patient size and/or use of iterative reconstruction technique. COMPARISON:  Prior study from 10/20/2022 and earlier. FINDINGS: Brain: Cerebral volume within normal limits for age. Moderate chronic microvascular ischemic changes noted. No acute intracranial hemorrhage. No acute large vessel territory infarct. No mass lesion or midline shift. No hydrocephalus or extra-axial fluid collection. Vascular: No abnormal hyperdense vessel. Scattered calcified atherosclerosis present at the skull base. Skull: Scalp soft tissues and calvarium within normal limits. Sinuses/Orbits: Globes and orbital soft tissues demonstrate no acute finding. Paranasal sinuses are clear. No mastoid effusion. Other: None. ASPECTS [facility] Stroke Program Early CT Score) - Ganglionic level infarction (caudate, lentiform nuclei, internal capsule, insula, M1-M3 cortex): 7 - Supraganglionic infarction (M4-M6 cortex): 3 Total score (0-10 with 10 being normal): 10 IMPRESSION: 1. No acute intracranial abnormality. 2. ASPECTS is 10. 3. Moderately advanced chronic microvascular ischemic disease. These results were communicated to Dr. Derry Lory at 9:01 pm on 02/11/2023 by text page via the [facility] messaging system. Electronically Signed   By: Rise Mu M.D.   On: 02/11/2023 21:02    Pending  Labs Unresulted Labs (From admission, onward)     Start     Ordered   02/12/23 0911  Urinalysis, Routine w reflex microscopic -Urine, Clean Catch  Once,   R       Question:  Specimen Source  Answer:  Urine, Clean Catch   02/12/23 0910            Vitals/Pain Today's Vitals   02/12/23 0730 02/12/23 0806 02/12/23 0959 02/12/23 1214  BP:   131/86   Pulse: 74  67   Resp: (!) 25  (!) 25   Temp:  98.3 F (36.8 C)  98.3 F (36.8 C)  TempSrc:  Oral  Oral  SpO2: 92%  96%   Weight:      Height:      PainSc:        Isolation Precautions No active isolations  Medications Medications  sodium chloride flush (NS) 0.9 % injection 3 mL (0 mLs Intravenous Hold 02/11/23 2054)  hydrALAZINE (APRESOLINE) injection 5-10 mg (has no administration in time range)  amLODipine (NORVASC) tablet 10 mg (10 mg Oral Given 02/12/23 0932)  hydrALAZINE (APRESOLINE) tablet 10 mg (10 mg Oral Given 02/12/23 0540)   stroke: early stages of recovery book (has no administration in time range)  acetaminophen (TYLENOL) tablet 650 mg (has no administration in time range)    Or  acetaminophen (TYLENOL) 160 MG/5ML solution 650 mg (has no administration in time range)    Or  acetaminophen (TYLENOL) suppository 650 mg (has no administration in time range)  aspirin EC tablet 81 mg (81 mg Oral Given 02/12/23 0932)  clopidogrel (PLAVIX) tablet 75 mg (75 mg Oral Given 02/12/23 0932)  rosuvastatin (CRESTOR) tablet 40 mg (40 mg Oral Given 02/12/23 0933)    Mobility walks     Focused Assessments Neuro Assessment Handoff:  Swallow screen pass? Yes  Cardiac Rhythm: Normal sinus rhythm NIH  Stroke Scale  Dizziness Present: No Headache Present: No Interval: Shift assessment Level of Consciousness (1a.)   : Alert, keenly responsive LOC Questions (1b. )   : Answers both questions correctly LOC Commands (1c. )   : Performs both tasks correctly Best Gaze (2. )  : Normal Visual (3. )  : No visual loss Facial Palsy (4. )     : Normal symmetrical movements Motor Arm, Left (5a. )   : No drift Motor Arm, Right (5b. ) : No drift Motor Leg, Left (6a. )  : No drift Motor Leg, Right (6b. ) : No drift Limb Ataxia (7. ): Absent Sensory (8. )  : Normal, no sensory loss Best Language (9. )  : No aphasia Dysarthria (10. ): Normal Extinction/Inattention (11.)   : No Abnormality Complete NIHSS TOTAL: 0 Last date known well: 02/11/23 Last time known well: 1630 Neuro Assessment: Within Defined Limits Neuro Checks:   Initial (02/11/23 2055)  Has TPA been given? No If patient is a Neuro Trauma and patient is going to OR before floor call report to 4N Charge nurse: [phone] or [phone]   R Recommendations: See Admitting Provider Note  Report given to:   Additional Notes: .

## 2023-02-12 NOTE — Progress Notes (Signed)
  Echocardiogram 2D Echocardiogram has been performed.  [name] 02/12/2023, 10:16 AM

## 2023-02-12 NOTE — Progress Notes (Signed)
OT Cancellation Note  Patient Details Name: [name] MRN: [id number] DOB: 07-14-1941   Cancelled Treatment:    Reason Eval/Treat Not Completed: OT screened, no needs identified, will sign off. Per PT pt presents at or near her baseline, pt reports resolution of all symptoms and pt is mobilizing independently at this time   Galen Manila 02/12/2023, 11:36 AM

## 2023-02-13 ENCOUNTER — Other Ambulatory Visit: Payer: Medicare Other

## 2023-02-14 NOTE — Progress Notes (Unsigned)
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 Normotensive blood pressure Rosuvastatin  daily.  LDL goal less than 70 Glycemic control.  Hgb A1c goal less than 70 Mediterranean diet Follow up 6 months     Subjective:  [name] is an 82 year old female with HTN, DM II, CKD and history of TIA who follows up for recent hospital admission.  History supplemented by her accompanying husband and hospital records.  Recent MRI/MRA brain and CT head personally reviewed.  UPDATE: Current medications:  ASA , Plavix , hydralazine, olmesartan-amlodipine-HCTZ, glipizide, rosuvastatin   She began having a recurrence of *** headache.  On 4/14, she started experiencing intermittent left arm and facial numbness.  She was admitted to [facility] where labs revealed AKI with BUN 37 and Cr 1.70.  CT head revealed no acute bleed or ischemic stroke.  MRI of brain revealed chronic small vessel ischemic changes and multiple scattered chronic microhemorrhages but no acute stroke.  MRA of head revealed no LVO or hemodynamically significant stenosis, aneurysm or other abnormality.  Carotid Doppler revealed no hemodynamically significant stenosis.  LDL was 198 and Hgb A1c 6.1.  She apparently was not taking the statin.  She was discharged on DAPT for 3 weeks followed by Plavix alone and started on rosuvastatin ..     HISTORY: On 12/15, she began experiencing acute onset of new headaches, described as severe paroxysmal right occipital-temporal stabbing headaches lasting a few seconds and occurring about every 10 minutes.  No associated nausea,  vomiting, new visual disturbance, or unilateral numbness or weakness.  She took her blood pressure which was in the 200s systolic.  She went to the ED on 12/18 but left before being seen.  Headaches continued, so she returned to a different ED at [facility] on 12/20 where CT head revealed a 1.3 cm hyperdense focus within the right occipital lobe suspicious for hemorrhage.  CTA of head revealed no LVO or other vascular abnormality.  CTV of head revealed no cerebral venous sinus thrombosis.  She was transferred to [facility] for continued workup and management.  MRI of brain revealed early subacute hemorrage within the right occipital lobe.  She was already on ASA , which was put on hold.  LDL 98.  Hgb A1c 6.4.  She was admitted and monitored.  Repeat CT Head on 12/22 revealed hemorrhage was unchanged.  She was discharged in stable condition.  She is still not on ASA. Reports that she has trouble seeing, worse since the hospital but ongoing prior to the hospitalization.  She followed up with her eye doctor and exam is stable.     10/18/2022 CT HEAD WO:  1.3 cm hyperdense focus within the right occipital lobe. This may reflect an acute parenchymal hemorrhage. However, a brain MRI without and with contrast is recommended to exclude a primary mass or metastatic lesion.  Moderate chronic small ischemic changes within the cerebral white matter. 10/18/2022 CTA/CTV HEAD:  1. No intracranial large vessel occlusion. No evidence of vascular malformation.  2. 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  3. Moderate stenosis in the right mid V4 segment.  4. No  evidence of venous sinus thrombosis or stenosis. The right transverse and sigmoid sinus are hypoplastic, which appears similar to the 04/18/2022 CTA head. 10/18/2022 MRI BRAIN W WO:  1. Early subacute hemorrhage in the right occipital lobe, with a volume of approximately 1 mL. Surrounding  edema without significant mass effect or midline shift. No evidence of active hemorrhage.  2. Multiple foci of hemosiderin deposition, most prominently in the parietal and occipital lobes; with the exception of the new subacute hemorrhage, the others appear unchanged compared to 04/17/2022.  While these could be the sequela of hypertensive microhemorrhages, early cerebral amyloid angiopathy or multiple cavernomas could appear similar. 10/20/2022 CT HEAD WO:  Unchanged hemorrhage in the right occipital lobe, further characterized on recent MRI.  PAST MEDICAL HISTORY: Past Medical History:  Diagnosis Date   Abdominal pain    Arthritis    cervical disc degeneration/ oa left knee, carpal tunnel rt wrist, adhesive capsulitis right shoulder, rt hand weakness; lumbar degeneration   Carpal tunnel syndrome    Complication of anesthesia 2006-at [facility]   breathing problems-no BP med given prior to surgery;  hx of being very sleepy after colon surgery --  states no problems with last right total knee replacement 2013   Constipation    Diabetes mellitus without complication    borderline - diet control   Diverticulitis hx of   Diverticulosis    E. coli infection    2020   Frequent UTI    hx of urethral injury during colon surgery - states frequent uti's since   GERD (gastroesophageal reflux disease)    H/O hiatal hernia    History of palpitations    in the past   History of shingles    has a lingering itching on back where shingles were   Hyperlipidemia    Hypertension    Numbness and tingling in right hand    pt. states has numbness of right hand very frequently-watch positioning   Obesity    Osteoarthritis    Osteoporosis    Pain    pain left knee and pain right hip and right groin   Personal history of colonic polyps-adenoma 08/26/2008   Pneumonia 2005   Shortness of breath 09/14/2021   Vitamin D deficiency     MEDICATIONS: Current Outpatient Medications on File Prior to Visit   Medication Sig Dispense Refill   aspirin EC 81 MG tablet Take 81 mg by mouth daily. Swallow whole.     Blood Pressure KIT 1 Units by Does not apply route daily. Check blood pressure daily for hypertension dx code I10 1 kit 0   clopidogrel (PLAVIX) 75 MG tablet Take 1 tablet (75 mg total) by mouth daily for 21 days. 21 tablet 0   estradiol (ESTRACE) 0.1 MG/GM vaginal cream Place 1 Applicatorful vaginally 3 (three) times a week.     glipiZIDE (GLUCOTROL) 10 MG tablet Take 5 mg by mouth daily before breakfast.     hydrALAZINE (APRESOLINE) 10 MG tablet Take 10 mg by mouth daily.     Lancets (ONETOUCH DELICA PLUS LANCET33G) MISC USE TO CHECK BLOOD SUGAR TWICE DAILY 100 each 3   Olmesartan-amLODIPine-HCTZ 40-10-12.5 MG TABS Take 1 tablet by mouth daily.     ONETOUCH VERIO test strip USE TO TEST 2 TIMES DAILY 100 strip 3   rosuvastatin (CRESTOR) 40 MG tablet Take 1 tablet (40 mg total) by mouth daily. 90 tablet 0   solifenacin (VESICARE) 5 MG tablet Take 5 mg by mouth daily as  needed (for urinary retention).     No current facility-administered medications on file prior to visit.    ALLERGIES: Allergies  Allergen Reactions   Bee Venom Hives   Latex Hives and Itching   Codeine Other (See Comments)    Hallucinations    Hydrocodone Other (See Comments)    hallucinations   Oxycodone Other (See Comments)    hallucinations    FAMILY HISTORY: Family History  Problem Relation Age of Onset   Breast cancer Mother 85   Hypertension Mother    Prostate cancer Father    Hypertension Father    Dementia Sister    Lung cancer Brother    Hypertension Brother    COPD Brother    Cancer Maternal Grandmother    Arthritis Sister    Hypertension Sister    Arthritis Sister    Hypertension Child    Hypertension Child    Hypertension Child    Colon polyps Neg Hx    Esophageal cancer Neg Hx    Rectal cancer Neg Hx    Stomach cancer Neg Hx    Colon cancer Neg Hx       Objective:  *** General:  No acute distress.  Patient appears ***-groomed.   Head:  Normocephalic/atraumatic Eyes:  Fundi examined but not visualized Neck: supple, no paraspinal tenderness, full range of motion Heart:  Regular rate and rhythm Lungs:  Clear to auscultation bilaterally Back: No paraspinal tenderness Neurological Exam: alert and oriented to person, place, and time.  Speech fluent and not dysarthric, language intact.  CN II-XII intact. Bulk and tone normal, muscle strength 5/5 throughout.  Sensation to light touch intact.  Deep tendon reflexes 2+ throughout, toes downgoing.  Finger to nose testing intact.  Gait normal, Romberg negative.   Shon Millet, DO  CC: ***

## 2023-02-15 ENCOUNTER — Ambulatory Visit: Payer: Medicare Other | Admitting: Neurology

## 2023-02-15 ENCOUNTER — Encounter: Payer: Self-pay | Admitting: Neurology

## 2023-02-15 VITALS — BP 120/65 | HR 71 | Ht 66.0 in | Wt 230.0 lb

## 2023-02-15 DIAGNOSIS — N1831 Chronic kidney disease, stage 3a: Secondary | ICD-10-CM

## 2023-02-15 DIAGNOSIS — I612 Nontraumatic intracerebral hemorrhage in hemisphere, unspecified: Secondary | ICD-10-CM

## 2023-02-15 DIAGNOSIS — G459 Transient cerebral ischemic attack, unspecified: Secondary | ICD-10-CM

## 2023-02-15 DIAGNOSIS — E785 Hyperlipidemia, unspecified: Secondary | ICD-10-CM | POA: Diagnosis not present

## 2023-02-15 DIAGNOSIS — E1122 Type 2 diabetes mellitus with diabetic chronic kidney disease: Secondary | ICD-10-CM

## 2023-02-15 DIAGNOSIS — I10 Essential (primary) hypertension: Secondary | ICD-10-CM | POA: Diagnosis not present

## 2023-02-15 NOTE — Patient Instructions (Signed)
Continue clopidogrel and aspirin  daily.  Once you finish clopidogrel, continue the aspirin  daily alone Continue rosuvastatin Blood pressure control Mediterranean diet.  Provide info.  Refer to dietician  Follow up 6 months.    Mediterranean Diet A Mediterranean diet refers to food and lifestyle choices that are based on the traditions of countries located on the [employer]. It focuses on eating more fruits, vegetables, whole grains, beans, nuts, seeds, and heart-healthy fats, and eating less dairy, meat, eggs, and processed foods with added sugar, salt, and fat. This way of eating has been shown to help prevent certain conditions and improve outcomes for people who have chronic diseases, like kidney disease and heart disease. What are tips for following this plan? Reading food labels Check the serving size of packaged foods. For foods such as rice and pasta, the serving size refers to the amount of cooked product, not dry. Check the total fat in packaged foods. Avoid foods that have saturated fat or trans fats. Check the ingredient list for added sugars, such as corn syrup. Shopping  Buy a variety of foods that offer a balanced diet, including: Fresh fruits and vegetables (produce). Grains, beans, nuts, and seeds. Some of these may be available in unpackaged forms or large amounts (in bulk). Fresh seafood. Poultry and eggs. Low-fat dairy products. Buy whole ingredients instead of prepackaged foods. Buy fresh fruits and vegetables in-season from local farmers markets. Buy plain frozen fruits and vegetables. If you do not have access to quality fresh seafood, buy precooked frozen shrimp or canned fish, such as tuna, salmon, or sardines. Stock your pantry so you always have certain foods on hand, such as olive oil, canned tuna, canned tomatoes, rice, pasta, and beans. Cooking Cook foods with extra-virgin olive oil instead of using butter or other vegetable oils. Have meat  as a side dish, and have vegetables or grains as your main dish. This means having meat in small portions or adding small amounts of meat to foods like pasta or stew. Use beans or vegetables instead of meat in common dishes like chili or lasagna. Experiment with different cooking methods. Try roasting, broiling, steaming, and sauting vegetables. Add frozen vegetables to soups, stews, pasta, or rice. Add nuts or seeds for added healthy fats and plant protein at each meal. You can add these to yogurt, salads, or vegetable dishes. Marinate fish or vegetables using olive oil, lemon juice, garlic, and fresh herbs. Meal planning Plan to eat one vegetarian meal one day each week. Try to work up to two vegetarian meals, if possible. Eat seafood two or more times a week. Have healthy snacks readily available, such as: Vegetable sticks with hummus. Greek yogurt. Fruit and nut trail mix. Eat balanced meals throughout the week. This includes: Fruit: 2-3 servings a day. Vegetables: 4-5 servings a day. Low-fat dairy: 2 servings a day. Fish, poultry, or lean meat: 1 serving a day. Beans and legumes: 2 or more servings a week. Nuts and seeds: 1-2 servings a day. Whole grains: 6-8 servings a day. Extra-virgin olive oil: 3-4 servings a day. Limit red meat and sweets to only a few servings a month. Lifestyle  Cook and eat meals together with your family, when possible. Drink enough fluid to keep your urine pale yellow. Be physically active every day. This includes: Aerobic exercise like running or swimming. Leisure activities like gardening, walking, or housework. Get 7-8 hours of sleep each night. If recommended by your health care provider, drink red wine in  moderation. This means 1 glass a day for nonpregnant women and 2 glasses a day for men. A glass of wine equals 5 oz (150 mL). What foods should I eat? Fruits Apples. Apricots. Avocado. Berries. Bananas. Cherries. Dates. Figs. Grapes. Lemons.  Melon. Oranges. Peaches. Plums. Pomegranate. Vegetables Artichokes. Beets. Broccoli. Cabbage. Carrots. Eggplant. Green beans. Chard. Kale. Spinach. Onions. Leeks. Peas. Squash. Tomatoes. Peppers. Radishes. Grains Whole-grain pasta. Brown rice. Bulgur wheat. Polenta. Couscous. Whole-wheat bread. Orpah Cobb. Meats and other proteins Beans. Almonds. Sunflower seeds. Pine nuts. Peanuts. Cod. Salmon. Scallops. Shrimp. Tuna. Tilapia. Clams. Oysters. Eggs. Poultry without skin. Dairy Low-fat milk. Cheese. Greek yogurt. Fats and oils Extra-virgin olive oil. Avocado oil. Grapeseed oil. Beverages Water. Red wine. Herbal tea. Sweets and desserts Greek yogurt with honey. Baked apples. Poached pears. Trail mix. Seasonings and condiments Basil. Cilantro. Coriander. Cumin. Mint. Parsley. Sage. Rosemary. Tarragon. Garlic. Oregano. Thyme. Pepper. Balsamic vinegar. Tahini. Hummus. Tomato sauce. Olives. Mushrooms. The items listed above may not be a complete list of foods and beverages you can eat. Contact a dietitian for more information. What foods should I limit? This is a list of foods that should be eaten rarely or only on special occasions. Fruits Fruit canned in syrup. Vegetables Deep-fried potatoes (french fries). Grains Prepackaged pasta or rice dishes. Prepackaged cereal with added sugar. Prepackaged snacks with added sugar. Meats and other proteins Beef. Pork. Lamb. Poultry with skin. Hot dogs. Tomasa Blase. Dairy Ice cream. Sour cream. Whole milk. Fats and oils Butter. Canola oil. Vegetable oil. Beef fat (tallow). Lard. Beverages Juice. Sugar-sweetened soft drinks. Beer. Liquor and spirits. Sweets and desserts Cookies. Cakes. Pies. Candy. Seasonings and condiments Mayonnaise. Pre-made sauces and marinades. The items listed above may not be a complete list of foods and beverages you should limit. Contact a dietitian for more information. Summary The Mediterranean diet includes both food  and lifestyle choices. Eat a variety of fresh fruits and vegetables, beans, nuts, seeds, and whole grains. Limit the amount of red meat and sweets that you eat. If recommended by your health care provider, drink red wine in moderation. This means 1 glass a day for nonpregnant women and 2 glasses a day for men. A glass of wine equals 5 oz (150 mL). This information is not intended to replace advice given to you by your health care provider. Make sure you discuss any questions you have with your health care provider. Document Revised: 11/21/2019 Document Reviewed: 09/18/2019 Elsevier Patient Education  2023 [employer].

## 2023-03-01 NOTE — Progress Notes (Signed)
Part of altered mental status workup

## 2023-03-14 NOTE — Progress Notes (Signed)
Flue symtpos

## 2023-03-15 ENCOUNTER — Emergency Department (HOSPITAL_BASED_OUTPATIENT_CLINIC_OR_DEPARTMENT_OTHER): Payer: Medicare Other | Admitting: Radiology

## 2023-03-15 ENCOUNTER — Encounter (HOSPITAL_BASED_OUTPATIENT_CLINIC_OR_DEPARTMENT_OTHER): Payer: Self-pay | Admitting: Emergency Medicine

## 2023-03-15 ENCOUNTER — Emergency Department (HOSPITAL_BASED_OUTPATIENT_CLINIC_OR_DEPARTMENT_OTHER)
Admission: EM | Admit: 2023-03-15 | Discharge: 2023-03-15 | Disposition: A | Discharge status: Home / Self Care | Payer: Medicare Other | Attending: Emergency Medicine | Admitting: Emergency Medicine

## 2023-03-15 ENCOUNTER — Other Ambulatory Visit: Payer: Self-pay

## 2023-03-15 DIAGNOSIS — Z79899 Other long term (current) drug therapy: Secondary | ICD-10-CM | POA: Diagnosis not present

## 2023-03-15 DIAGNOSIS — N390 Urinary tract infection, site not specified: Secondary | ICD-10-CM | POA: Insufficient documentation

## 2023-03-15 DIAGNOSIS — Z91040 Latex allergy status: Secondary | ICD-10-CM | POA: Diagnosis not present

## 2023-03-15 DIAGNOSIS — R051 Acute cough: Secondary | ICD-10-CM

## 2023-03-15 DIAGNOSIS — J069 Acute upper respiratory infection, unspecified: Secondary | ICD-10-CM | POA: Diagnosis not present

## 2023-03-15 DIAGNOSIS — Z7982 Long term (current) use of aspirin: Secondary | ICD-10-CM | POA: Diagnosis not present

## 2023-03-15 DIAGNOSIS — R059 Cough, unspecified: Secondary | ICD-10-CM | POA: Diagnosis present

## 2023-03-15 DIAGNOSIS — Z1152 Encounter for screening for COVID-19: Secondary | ICD-10-CM | POA: Diagnosis not present

## 2023-03-15 DIAGNOSIS — Z794 Long term (current) use of insulin: Secondary | ICD-10-CM | POA: Insufficient documentation

## 2023-03-15 DIAGNOSIS — I10 Essential (primary) hypertension: Secondary | ICD-10-CM | POA: Diagnosis not present

## 2023-03-15 DIAGNOSIS — R030 Elevated blood-pressure reading, without diagnosis of hypertension: Secondary | ICD-10-CM

## 2023-03-15 LAB — URINALYSIS, ROUTINE W REFLEX MICROSCOPIC
Bacteria, UA: NONE SEEN
Bilirubin Urine: NEGATIVE
Glucose, UA: NEGATIVE mg/dL
Hgb urine dipstick: NEGATIVE
Ketones, ur: NEGATIVE mg/dL
Nitrite: NEGATIVE
Protein, ur: NEGATIVE mg/dL
Specific Gravity, Urine: 1.013 (ref 1.005–1.030)
pH: 5.5 (ref 5.0–8.0)

## 2023-03-15 LAB — RESP PANEL BY RT-PCR (RSV, FLU A&B, COVID)  RVPGX2
Influenza A by PCR: NEGATIVE
Influenza B by PCR: NEGATIVE
Resp Syncytial Virus by PCR: NEGATIVE
SARS Coronavirus 2 by RT PCR: NEGATIVE

## 2023-03-15 MED ORDER — CEPHALEXIN 250 MG PO CAPS
500.0000 mg | ORAL_CAPSULE | Freq: Once | ORAL | Status: AC
  Administered 2023-03-15: 500 mg via ORAL
  Filled 2023-03-15: qty 2

## 2023-03-15 MED ORDER — CEPHALEXIN 500 MG PO CAPS: 500.0000 mg | ORAL_CAPSULE | Freq: Four times a day (QID) | ORAL | 0 refills | Status: DC

## 2023-03-15 NOTE — Discharge Instructions (Addendum)
It was our pleasure to provide your ER care today - we hope that you feel better.  Drink plenty of fluids/stay well hydrated. You may try nyquil, mucinex or similar over the counter medication to help with cough and nasal congestion.  The lab tests show a possible urine infection - take antibiotic (keflex) as prescribed.  Follow up with primary care doctor in one week if symptoms fail to improve/resolve. Also follow up with your doctor regarding your blood pressure that is high today.   Return to ER if worse, new symptoms, chest pain, trouble breathing, or other concern.

## 2023-03-15 NOTE — ED Provider Notes (Signed)
[place] EMERGENCY DEPARTMENT AT [facility] Provider Note   CSN: [id number] Arrival date & time: 03/15/23  2116     History  Chief Complaint  Patient presents with   Cough    DONNIESHA BOITNOTT is a 82 y.o. female.  Patient c/o cough, generally non productive but occasional small amount phlegm. No hemoptysis. No sore throat. +nasal congestion. No sinus pain. No severe headaches. No neck pain or stiffness. No chest pain. No sob. No abd pain or nvd. Indicates urine appears cloudy and questions possible uti, no dysuria. No extremity pain or swelling. No rash. No specific known ill contacts. No fever or chills.   The history is provided by the patient.  Cough Associated symptoms: rhinorrhea   Associated symptoms: no chest pain, no chills, no fever, no headaches, no rash, no shortness of breath and no sore throat        Home Medications Prior to Admission medications   Medication Sig Start Date End Date Taking? Authorizing Provider  aspirin EC 81 MG tablet Take 81 mg by mouth daily. Swallow whole.    [provider]  Blood Pressure KIT 1 Units by Does not apply route daily. Check blood pressure daily for hypertension dx code I10 09/14/21   Chilton Si, MD  estradiol (ESTRACE) 0.1 MG/GM vaginal cream Place 1 Applicatorful vaginally 3 (three) times a week. 09/14/22   [provider]  glipiZIDE (GLUCOTROL) 10 MG tablet Take 5 mg by mouth daily before breakfast. 09/02/22   [provider]  hydrALAZINE (APRESOLINE) 10 MG tablet Take 10 mg by mouth daily. 01/15/23   [provider]  Lancets (ONETOUCH DELICA PLUS LANCET33G) MISC USE TO CHECK BLOOD SUGAR TWICE DAILY 12/24/20   Dorothyann Peng, MD  Olmesartan-amLODIPine-HCTZ 40-10-12.5 MG TABS Take 1 tablet by mouth daily. 12/02/22   [provider]  Letta Pate VERIO test strip USE TO TEST 2 TIMES DAILY 12/20/20   Dorothyann Peng, MD  rosuvastatin (CRESTOR) 40 MG tablet Take 1 tablet (40 mg  total) by mouth daily. 02/13/23   Arnetha Courser, MD  solifenacin (VESICARE) 5 MG tablet Take 5 mg by mouth daily as needed (for urinary retention). 02/09/22   [provider]      Allergies    Bee venom, Latex, Codeine, Hydrocodone, and Oxycodone    Review of Systems   Review of Systems  Constitutional:  Negative for chills and fever.  HENT:  Positive for congestion and rhinorrhea. Negative for sore throat and trouble swallowing.   Eyes:  Negative for redness.  Respiratory:  Positive for cough. Negative for shortness of breath.   Cardiovascular:  Negative for chest pain and leg swelling.  Gastrointestinal:  Negative for abdominal pain, diarrhea and vomiting.  Genitourinary:  Negative for flank pain.  Musculoskeletal:  Negative for back pain and neck pain.  Skin:  Negative for rash.  Neurological:  Negative for headaches.  Hematological:  Does not bruise/bleed easily.  Psychiatric/Behavioral:  Negative for confusion.     Physical Exam Updated Vital Signs BP (!) 178/76   Pulse 87   Temp 99.4 F (37.4 C) (Oral)   Resp 20   SpO2 94%  Physical Exam Vitals and nursing note reviewed.  Constitutional:      Appearance: Normal appearance. She is well-developed.  HENT:     Head: Atraumatic.     Nose: Congestion present.     Mouth/Throat:     Mouth: Mucous membranes are moist.     Pharynx: Oropharynx is clear.  No oropharyngeal exudate or posterior oropharyngeal erythema.  Eyes:     General: No scleral icterus.    Conjunctiva/sclera: Conjunctivae normal.  Neck:     Trachea: No tracheal deviation.     Comments: No stiffness or rigidity.  Cardiovascular:     Rate and Rhythm: Normal rate and regular rhythm.     Pulses: Normal pulses.     Heart sounds: Normal heart sounds. No murmur heard.    No friction rub. No gallop.  Pulmonary:     Effort: Pulmonary effort is normal. No respiratory distress.     Breath sounds: Normal breath sounds.  Abdominal:     General: Bowel  sounds are normal. There is no distension.     Palpations: Abdomen is soft.     Tenderness: There is no abdominal tenderness.  Musculoskeletal:        General: No swelling or tenderness.     Cervical back: Normal range of motion and neck supple. No rigidity. No muscular tenderness.     Right lower leg: No edema.     Left lower leg: No edema.  Lymphadenopathy:     Cervical: No cervical adenopathy.  Skin:    General: Skin is warm and dry.     Findings: No rash.  Neurological:     Mental Status: She is alert.     Comments: Alert, speech normal.   Psychiatric:        Mood and Affect: Mood normal.     ED Results / Procedures / Treatments   Labs (all labs ordered are listed, but only abnormal results are displayed) Results for orders placed or performed during the hospital encounter of 03/15/23  Resp panel by RT-PCR (RSV, Flu A&B, Covid) Anterior Nasal Swab   Specimen: Anterior Nasal Swab  Result Value Ref Range   SARS Coronavirus 2 by RT PCR NEGATIVE NEGATIVE   Influenza A by PCR NEGATIVE NEGATIVE   Influenza B by PCR NEGATIVE NEGATIVE   Resp Syncytial Virus by PCR NEGATIVE NEGATIVE  Urinalysis, Routine w reflex microscopic -Urine, Clean Catch  Result Value Ref Range   Color, Urine YELLOW YELLOW   APPearance CLEAR CLEAR   Specific Gravity, Urine 1.013 1.005 - 1.030   pH 5.5 5.0 - 8.0   Glucose, UA NEGATIVE NEGATIVE mg/dL   Hgb urine dipstick NEGATIVE NEGATIVE   Bilirubin Urine NEGATIVE NEGATIVE   Ketones, ur NEGATIVE NEGATIVE mg/dL   Protein, ur NEGATIVE NEGATIVE mg/dL   Nitrite NEGATIVE NEGATIVE   Leukocytes,Ua LARGE (A) NEGATIVE   RBC / HPF 6-10 0 - 5 RBC/hpf   WBC, UA 11-20 0 - 5 WBC/hpf   Bacteria, UA NONE SEEN NONE SEEN   Squamous Epithelial / HPF 6-10 0 - 5 /HPF   Hyaline Casts, UA PRESENT      EKG None  Radiology DG Chest 2 View  Result Date: 03/15/2023 CLINICAL DATA:  Cough. EXAM: CHEST - 2 VIEW COMPARISON:  February 12, 2023 FINDINGS: The heart size and  mediastinal contours are within normal limits. Low lung volumes are noted with stable moderate severity elevation of the right hemidiaphragm. There is no evidence of acute infiltrate, pleural effusion or pneumothorax. Multilevel degenerative changes are seen throughout the thoracic spine. IMPRESSION: Low lung volumes without evidence of acute or active cardiopulmonary disease. Electronically Signed   By: Aram Candela M.D.   On: 03/15/2023 22:02    Procedures Procedures    Medications Ordered in ED Medications  cephALEXin (KEFLEX) capsule 500 mg (has no  administration in time range)    ED Course/ Medical Decision Making/ A&P                             Medical Decision Making Problems Addressed: Acute cough: acute illness or injury Acute UTI: acute illness or injury Elevated blood pressure reading: acute illness or injury Essential hypertension: chronic illness or injury with exacerbation, progression, or side effects of treatment that poses a threat to life or bodily functions Viral URI with cough: acute illness or injury with systemic symptoms  Amount and/or Complexity of Data Reviewed Independent Historian: spouse    Details: hx External Data Reviewed: notes. Labs: ordered. Decision-making details documented in ED Course. Radiology: ordered and independent interpretation performed. Decision-making details documented in ED Course.  Risk Prescription drug management. Decision regarding hospitalization.   Iv ns. Continuous pulse ox and cardiac monitoring. Labs ordered/sent. Imaging ordered.   Differential diagnosis includes pneumonia, covid, flu, etc. Dispo decision including potential need for admission considered - will get labs and imaging and reassess.   Reviewed nursing notes and prior charts for additional history. External reports reviewed. Additional history from: spouse.   Cardiac monitor: sinus rhythm, rate 87.  Labs reviewed/interpreted by me - covid and flu  neg. Ua w 11-20 wbc.   Xrays reviewed/interpreted by me - no pna.   Recheck, breathing comfortably, no increased wob, pulse ox 97% room air.   Keflex po for possible uti.  Pt appears stable for d/c.  Rec pcp f/u.  Return precautions provided.          Final Clinical Impression(s) / ED Diagnoses Final diagnoses:  None    Rx / DC Orders ED Discharge Orders     None         Cathren Laine, MD 03/15/23 2219

## 2023-03-15 NOTE — ED Triage Notes (Signed)
Coughing, since Monday. Coughing up phelm. No eating much Also reports "bubbles in my pee"

## 2023-03-19 ENCOUNTER — Ambulatory Visit
Admission: RE | Admit: 2023-03-19 | Discharge: 2023-03-19 | Disposition: A | Discharge status: Home / Self Care | Payer: Medicare Other | Source: Ambulatory Visit | Attending: Family Medicine | Admitting: Family Medicine

## 2023-03-19 ENCOUNTER — Ambulatory Visit: Payer: Medicare Other

## 2023-03-19 DIAGNOSIS — Z1231 Encounter for screening mammogram for malignant neoplasm of breast: Secondary | ICD-10-CM

## 2023-03-21 ENCOUNTER — Ambulatory Visit: Payer: Medicare Other

## 2023-05-08 ENCOUNTER — Ambulatory Visit: Payer: Medicare Other | Admitting: Podiatry

## 2023-05-08 DIAGNOSIS — L84 Corns and callosities: Secondary | ICD-10-CM

## 2023-05-08 DIAGNOSIS — N1831 Chronic kidney disease, stage 3a: Secondary | ICD-10-CM

## 2023-05-08 DIAGNOSIS — E1122 Type 2 diabetes mellitus with diabetic chronic kidney disease: Secondary | ICD-10-CM | POA: Diagnosis not present

## 2023-05-08 DIAGNOSIS — B351 Tinea unguium: Secondary | ICD-10-CM

## 2023-05-08 DIAGNOSIS — M79609 Pain in unspecified limb: Secondary | ICD-10-CM | POA: Diagnosis not present

## 2023-05-11 ENCOUNTER — Encounter: Payer: Self-pay | Admitting: Podiatry

## 2023-05-11 NOTE — Progress Notes (Signed)
  Subjective:  Patient ID: [name], female    DOB: 05/14/1941,  MRN: [id number]  [name] presents to clinic today for at risk foot care. Pt has h/o NIDDM with chronic kidney disease and painful thick toenails that are difficult to trim. Pain interferes with ambulation. Aggravating factors include wearing enclosed shoe gear. Pain is relieved with periodic professional debridement.  Chief Complaint  Patient presents with   Nail Problem    RFC    New problem(s): None.   PCP is Vania Rea, FNP.  Allergies  Allergen Reactions   Bee Venom Hives   Latex Hives and Itching   Codeine Other (See Comments)    Hallucinations    Hydrocodone Other (See Comments)    hallucinations   Oxycodone Other (See Comments)    hallucinations    Review of Systems: Negative except as noted in the HPI.  Objective: No changes noted in today's physical examination. There were no vitals filed for this visit. [name] is a pleasant 82 y.o. female in NAD. AAO x 3.  Vascular Examination: Capillary refill time immediate b/l. Vascular status intact b/l with palpable pedal pulses. Pedal hair present b/l. No pain with calf compression b/l. Skin temperature gradient WNL b/l. Trace edema noted BLE. No cyanosis or clubbing noted b/l LE.  Neurological Examination: Sensation grossly intact b/l with 10 gram monofilament. Vibratory sensation intact b/l.   Dermatological Examination: Pedal skin with normal turgor, texture and tone b/l.  No open wounds. No interdigital macerations.   Toenails 1-5 b/l thick, discolored, elongated with subungual debris and pain on dorsal palpation.   Hyperkeratotic lesion(s) R 2nd toe and R 4th toe.  No erythema, no edema, no drainage, no fluctuance.  Musculoskeletal Examination: Muscle strength 5/5 to all lower extremity muscle groups bilaterally. Palpable exostosis noted dorsomedial midfoot joint of both feet. Adductovarus deformity 3-5  bilaterally.  Radiographs: None Assessment/Plan: 1. Pain due to onychomycosis of nail   2. Corns   3. Type 2 diabetes mellitus with stage 3a chronic kidney disease, without long-term current use of insulin (HCC)   -Consent given for treatment as described below: -Examined patient. -Continue foot and shoe inspections daily. Monitor blood glucose per PCP/Endocrinologist's recommendations. -Patient to continue soft, supportive shoe gear daily. -Toenails 1-5 b/l were debrided in length and girth with sterile nail nippers and dremel without iatrogenic bleeding.  -Corn(s) R 2nd toe and R 4th toe pared utilizing sterile scalpel blade without complication or incident. Total number debrided=2. -Patient/POA to call should there be question/concern in the interim.   Return in about 3 months (around 08/08/2023).  Freddie Breech, DPM

## 2023-05-25 ENCOUNTER — Emergency Department (HOSPITAL_COMMUNITY): Payer: Medicare Other

## 2023-05-25 ENCOUNTER — Telehealth: Payer: Self-pay | Admitting: Neurology

## 2023-05-25 ENCOUNTER — Other Ambulatory Visit: Payer: Self-pay

## 2023-05-25 ENCOUNTER — Encounter (HOSPITAL_COMMUNITY): Payer: Self-pay

## 2023-05-25 ENCOUNTER — Emergency Department (HOSPITAL_COMMUNITY)
Admission: EM | Admit: 2023-05-25 | Discharge: 2023-05-25 | Disposition: A | Discharge status: Home / Self Care | Payer: Medicare Other | Attending: Emergency Medicine | Admitting: Emergency Medicine

## 2023-05-25 DIAGNOSIS — R531 Weakness: Secondary | ICD-10-CM | POA: Insufficient documentation

## 2023-05-25 DIAGNOSIS — I6782 Cerebral ischemia: Secondary | ICD-10-CM | POA: Diagnosis not present

## 2023-05-25 DIAGNOSIS — I10 Essential (primary) hypertension: Secondary | ICD-10-CM | POA: Insufficient documentation

## 2023-05-25 DIAGNOSIS — R600 Localized edema: Secondary | ICD-10-CM | POA: Diagnosis not present

## 2023-05-25 DIAGNOSIS — R001 Bradycardia, unspecified: Secondary | ICD-10-CM | POA: Diagnosis not present

## 2023-05-25 DIAGNOSIS — E119 Type 2 diabetes mellitus without complications: Secondary | ICD-10-CM | POA: Insufficient documentation

## 2023-05-25 DIAGNOSIS — R790 Abnormal level of blood mineral: Secondary | ICD-10-CM | POA: Diagnosis not present

## 2023-05-25 DIAGNOSIS — Z79899 Other long term (current) drug therapy: Secondary | ICD-10-CM | POA: Insufficient documentation

## 2023-05-25 DIAGNOSIS — R5383 Other fatigue: Secondary | ICD-10-CM | POA: Insufficient documentation

## 2023-05-25 DIAGNOSIS — Z8673 Personal history of transient ischemic attack (TIA), and cerebral infarction without residual deficits: Secondary | ICD-10-CM | POA: Insufficient documentation

## 2023-05-25 DIAGNOSIS — Z96652 Presence of left artificial knee joint: Secondary | ICD-10-CM | POA: Insufficient documentation

## 2023-05-25 HISTORY — DX: Cerebral infarction, unspecified: I63.9

## 2023-05-25 LAB — URINALYSIS, W/ REFLEX TO CULTURE (INFECTION SUSPECTED)
Bacteria, UA: NONE SEEN
Bilirubin Urine: NEGATIVE
Glucose, UA: NEGATIVE mg/dL
Hgb urine dipstick: NEGATIVE
Ketones, ur: NEGATIVE mg/dL
Leukocytes,Ua: NEGATIVE
Nitrite: NEGATIVE
Protein, ur: NEGATIVE mg/dL
Specific Gravity, Urine: 1.009 (ref 1.005–1.030)
pH: 7 (ref 5.0–8.0)

## 2023-05-25 LAB — COMPREHENSIVE METABOLIC PANEL
ALT: 24 U/L (ref 0–44)
AST: 26 U/L (ref 15–41)
Albumin: 3.5 g/dL (ref 3.5–5.0)
Alkaline Phosphatase: 43 U/L (ref 38–126)
Anion gap: 12 (ref 5–15)
BUN: 13 mg/dL (ref 8–23)
CO2: 27 mmol/L (ref 22–32)
Calcium: 9.1 mg/dL (ref 8.9–10.3)
Chloride: 103 mmol/L (ref 98–111)
Creatinine, Ser: 1.04 mg/dL — ABNORMAL HIGH (ref 0.44–1.00)
GFR, Estimated: 54 mL/min — ABNORMAL LOW (ref >60)
Glucose, Bld: 76 mg/dL (ref 70–99)
Potassium: 3.9 mmol/L (ref 3.5–5.1)
Sodium: 142 mmol/L (ref 135–145)
Total Bilirubin: 1.2 mg/dL (ref 0.3–1.2)
Total Protein: 6.9 g/dL (ref 6.5–8.1)

## 2023-05-25 LAB — TSH: TSH: 2.038 u[IU]/mL (ref 0.350–4.500)

## 2023-05-25 LAB — CBC
HCT: 39.1 % (ref 36.0–46.0)
Hemoglobin: 12.7 g/dL (ref 12.0–15.0)
MCH: 29.3 pg (ref 26.0–34.0)
MCHC: 32.5 g/dL (ref 30.0–36.0)
MCV: 90.1 fL (ref 80.0–100.0)
Platelets: 210 10*3/uL (ref 150–400)
RBC: 4.34 MIL/uL (ref 3.87–5.11)
RDW: 13 % (ref 11.5–15.5)
WBC: 9.2 10*3/uL (ref 4.0–10.5)
nRBC: 0 % (ref 0.0–0.2)

## 2023-05-25 LAB — ETHANOL: Alcohol, Ethyl (B): <10 mg/dL (ref <10)

## 2023-05-25 LAB — I-STAT CHEM 8, ED
BUN: 14 mg/dL (ref 8–23)
Calcium, Ion: 1.22 mmol/L (ref 1.15–1.40)
Chloride: 105 mmol/L (ref 98–111)
Creatinine, Ser: 1 mg/dL (ref 0.44–1.00)
Glucose, Bld: 72 mg/dL (ref 70–99)
HCT: 38 % (ref 36.0–46.0)
Hemoglobin: 12.9 g/dL (ref 12.0–15.0)
Potassium: 3.9 mmol/L (ref 3.5–5.1)
Sodium: 143 mmol/L (ref 135–145)
TCO2: 25 mmol/L (ref 22–32)

## 2023-05-25 LAB — T4, FREE: Free T4: 0.95 ng/dL (ref 0.61–1.12)

## 2023-05-25 LAB — DIFFERENTIAL
Abs Immature Granulocytes: 0.04 10*3/uL (ref 0.00–0.07)
Basophils Absolute: 0.1 10*3/uL (ref 0.0–0.1)
Basophils Relative: 1 %
Eosinophils Absolute: 0.3 10*3/uL (ref 0.0–0.5)
Eosinophils Relative: 4 %
Immature Granulocytes: 0 %
Lymphocytes Relative: 27 %
Lymphs Abs: 2.5 10*3/uL (ref 0.7–4.0)
Monocytes Absolute: 0.7 10*3/uL (ref 0.1–1.0)
Monocytes Relative: 8 %
Neutro Abs: 5.6 10*3/uL (ref 1.7–7.7)
Neutrophils Relative %: 60 %

## 2023-05-25 LAB — PROTIME-INR
INR: 1 (ref 0.8–1.2)
Prothrombin Time: 13.8 seconds (ref 11.4–15.2)

## 2023-05-25 LAB — RAPID URINE DRUG SCREEN, HOSP PERFORMED
Amphetamines: NOT DETECTED
Barbiturates: NOT DETECTED
Benzodiazepines: NOT DETECTED
Cocaine: NOT DETECTED
Opiates: NOT DETECTED
Tetrahydrocannabinol: NOT DETECTED

## 2023-05-25 LAB — BRAIN NATRIURETIC PEPTIDE: B Natriuretic Peptide: 356.8 pg/mL — ABNORMAL HIGH (ref 0.0–100.0)

## 2023-05-25 LAB — MAGNESIUM: Magnesium: 1.8 mg/dL (ref 1.7–2.4)

## 2023-05-25 LAB — APTT: aPTT: 31 seconds (ref 24–36)

## 2023-05-25 MED ORDER — FUROSEMIDE 20 MG PO TABS
20.0000 mg | ORAL_TABLET | Freq: Once | ORAL | Status: AC
  Administered 2023-05-25: 20 mg via ORAL
  Filled 2023-05-25: qty 1

## 2023-05-25 MED ORDER — FUROSEMIDE 20 MG PO TABS: 20.0000 mg | ORAL_TABLET | Freq: Every day | ORAL | 0 refills | Status: DC

## 2023-05-25 NOTE — ED Provider Triage Note (Signed)
Emergency Medicine Provider Triage Evaluation Note  [name] , a 82 y.o. female  was evaluated in triage.  Pt complains of possible stroke.  She mostly complains of some generalized weakness and fatigue.  She has some increased urination that started 2 days ago.  Last night she did have some numbness on the left side of her face but that has resolved.  She does not have any weakness or numbness to her extremities.  No new vision changes.  No new speech deficits.  She had a recent stroke in December and a TIA in April of this year.  No fevers.  No vomiting..  Review of Systems  Positive: Left-sided facial numbness, fatigue, urinary symptoms Negative: New stroke symptoms other than the left-sided facial numbness, no fevers  Physical Exam  BP (!) 189/90 (BP Location: Right Arm)   Pulse (!) 53   Temp 97.9 F (36.6 C) (Oral)   Resp 16   SpO2 97%  Gen:   Awake, no distress    Resp:  Normal effort   MSK:   Moves extremities without difficulty   Other:  Motor 5 out of 5 all extremities, sensation grossly intact to light touch all extremities, cranial nerves II through XII grossly intact  Medical Decision Making  Medically screening exam initiated at 10:35 AM.  Appropriate orders placed.  [name] was informed that the remainder of the evaluation will be completed by another provider, this initial triage assessment does not replace that evaluation, and the importance of remaining in the ED until their evaluation is complete.      Rolan Bucco, MD 05/25/23 1037

## 2023-05-25 NOTE — ED Triage Notes (Signed)
Pt c/o weakness, fatigue, dysuria, frequent urination, Haxtwo days

## 2023-05-25 NOTE — Telephone Encounter (Signed)
Patient called [name], I called patient back , Patient wanted to be seen today but she went to the hospital before I could call her back. [name]

## 2023-05-25 NOTE — Discharge Instructions (Addendum)
You were seen today for weakness, fatigue. While you were here we monitored your vitals, preformed a physical exam, and checked labs and an EKG. These were all reassuring and there is no indication for any further testing or intervention in the emergency department at this time.   Things to do:  - Follow up with your primary care provider within the next 1-2 weeks - Take the Lasix for the next 3 days  Return to the emergency department if you have any new or worsening symptoms including worsening fatigue, headache, dizziness, weakness, nausea or vomiting, or if you have any other concerns.

## 2023-05-25 NOTE — ED Provider Notes (Signed)
[place] EMERGENCY DEPARTMENT AT [facility] Provider Note  MDM   HPI/ROS:  [name] is a 82 y.o. female with a medical history as below who presents for evaluation of generalized weakness and fatigue.  She reports that she is also had some increased urination over the last several days.  She is having difficulty with sleep and did report that she has had some numbness on the left side of her face that resolved but she also had some neck pain and she thought these were both related to the pillow that she was using.  She denies any numbness or weakening weakness in any of her extremities, does not have any new vision changes or speech deficits.  She denies any nausea, vomiting, dizziness, lightheadedness.  Reports that she is just feeling very tired and has fallen asleep even while working.  She does report that she has had multiple new over-the-counter supplements that she is trying at this time.  Patient is complaint of generalized weakness and fatigue are most likely secondary to dehydration versus urinary tract infection.  Considered other etiologies such as severe metabolic derangement or electrolyte abnormalities, ischemia/ACS, heart failure, and intracranial essential process but I feel as though these are very unlikely given her history and physical exam.  She does have some bradycardia, and this along with fatigue could indicate thyroid problem.  Will plan for thyroid labs, serial reevaluation.  Dispo likely discharge.  On reevaluation, the patient does have a mildly elevated BNP without any history of congestive heart failure.  Will give her a dose of p.o. Lasix here and send her home with a 3-day supply.  Strict instruction to follow-up with PMD in 1 week.  Return precautions also provided.  Disposition: Discharge   I discussed the plan for discharge with the patient and/or their surrogate at bedside prior to discharge and they were in agreement with the plan and  verbalized understanding of the return precautions provided. All questions answered to the best of my ability. Ultimately, the patient was discharged in stable condition with stable vital signs. I am reassured that they are capable of close follow up and good social support at home.   Clinical Impression:  1. Weakness   2. Other fatigue     Rx / DC Orders ED Discharge Orders          Ordered    furosemide (LASIX) 20 MG tablet  Daily        05/25/23 2025            The plan for this patient was discussed with Dr. Silverio Lay, who voiced agreement and who oversaw evaluation and treatment of this patient.   Clinical Complexity A medically appropriate history, review of systems, and physical exam was performed.  My independent interpretations of EKG, labs, and radiology are documented in the ED course above.   If decision rules were used in this patient's evaluation, they are listed below.   Click here for ABCD2, HEART and other calculatorsREFRESH Note before signing   Patient's presentation is most consistent with acute presentation with potential threat to life or bodily function.  Medical Decision Making Amount and/or Complexity of Data Reviewed Labs: ordered.  Risk Prescription drug management.    HPI/ROS      See MDM section for pertinent HPI and ROS. A complete ROS was performed with pertinent positives/negatives noted above.   Past Medical History:  Diagnosis Date   Abdominal pain    Arthritis  cervical disc degeneration/ oa left knee, carpal tunnel rt wrist, adhesive capsulitis right shoulder, rt hand weakness; lumbar degeneration   Carpal tunnel syndrome    Complication of anesthesia 2006-at [facility]   breathing problems-no BP med given prior to surgery;  hx of being very sleepy after colon surgery --  states no problems with last right total knee replacement 2013   Constipation    Diabetes mellitus without complication (HCC)    borderline - diet control    Diverticulitis hx of   Diverticulosis    E. coli infection    2020   Frequent UTI    hx of urethral injury during colon surgery - states frequent uti's since   GERD (gastroesophageal reflux disease)    H/O hiatal hernia    History of palpitations    in the past   History of shingles    has a lingering itching on back where shingles were   Hyperlipidemia    Hypertension    Numbness and tingling in right hand    pt. states has numbness of right hand very frequently-watch positioning   Obesity    Osteoarthritis    Osteoporosis    Pain    pain left knee and pain right hip and right groin   Personal history of colonic polyps-adenoma 08/26/2008   Pneumonia 2005   Shortness of breath 09/14/2021   Stroke (HCC)    Vitamin D deficiency     Past Surgical History:  Procedure Laterality Date   ABDOMINAL HYSTERECTOMY     bladder tack     BREAST BIOPSY Left    BREAST SURGERY     breast duct resection- benign   CARPAL TUNNEL RELEASE Right 06/04/2014   Procedure: RIGHT CARPAL TUNNEL RELEASE;  Surgeon: Dominica Severin, MD;  Location: [facility];  Service: Orthopedics;  Laterality: Right;   COLON RESECTION  2008   hx diverticulosis   COLON SURGERY     COLONOSCOPY     colonoscopy [id number]     JOINT REPLACEMENT     OOPHORECTOMY     POLYPECTOMY     skin graft left arm - traumatic compression injury left upper arm     temporary ureter stent     TOTAL KNEE ARTHROPLASTY  01/05/2012   Procedure: TOTAL KNEE ARTHROPLASTY;  Surgeon: Loanne Drilling, MD;  Location: [facility];  Service: Orthopedics;  Laterality: Right;   TOTAL KNEE ARTHROPLASTY Left 08/17/2014   Procedure: LEFT TOTAL KNEE ARTHROPLASTY;  Surgeon: Loanne Drilling, MD;  Location: [facility];  Service: Orthopedics;  Laterality: Left;   ureter repair for tyransected left ureter        Physical Exam   Vitals:   05/25/23 1055 05/25/23 1828 05/25/23 1900 05/25/23 2030  BP:  (!) 183/84    Pulse:  60 (!) 59 (!) 58   Resp:  17  (!) 21  Temp:  (!) 97.5 F (36.4 C)    TempSrc:  Oral    SpO2:  100% 100% 100%  Weight: 104.3 kg     Height: 5\' 6"  (1.676 m)       Physical Exam Vitals and nursing note reviewed.  Constitutional:      General: She is not in acute distress.    Appearance: She is well-developed.  HENT:     Head: Normocephalic and atraumatic.  Eyes:     Conjunctiva/sclera: Conjunctivae normal.  Cardiovascular:     Rate and Rhythm: Regular rhythm. Bradycardia present.     Heart  sounds: No murmur heard. Pulmonary:     Effort: Pulmonary effort is normal. No respiratory distress.     Breath sounds: Normal breath sounds.  Abdominal:     Palpations: Abdomen is soft.     Tenderness: There is no abdominal tenderness.  Musculoskeletal:        General: No swelling.     Cervical back: Neck supple.     Right lower leg: 1+ Edema present.     Left lower leg: 1+ Edema present.  Skin:    General: Skin is warm and dry.     Capillary Refill: Capillary refill takes less than 2 seconds.  Neurological:     Mental Status: She is alert.  Psychiatric:        Mood and Affect: Mood normal.      Procedures   If procedures were preformed on this patient, they are listed below:  Procedures   Fayrene Helper, MD Emergency Medicine PGY-2   Please note that this documentation was produced with the assistance of voice-to-text technology and may contain errors.    Fayrene Helper, MD 05/26/23 0128    Charlynne Pander, MD 05/28/23 [phone]

## 2023-05-30 ENCOUNTER — Ambulatory Visit: Payer: Medicare Other | Admitting: Nurse Practitioner

## 2023-06-08 ENCOUNTER — Ambulatory Visit (INDEPENDENT_AMBULATORY_CARE_PROVIDER_SITE_OTHER): Payer: Medicare Other | Admitting: Nurse Practitioner

## 2023-06-08 ENCOUNTER — Encounter: Payer: Self-pay | Admitting: Nurse Practitioner

## 2023-06-08 VITALS — BP 132/84 | HR 64 | Ht 67.0 in | Wt 229.8 lb

## 2023-06-08 DIAGNOSIS — E1159 Type 2 diabetes mellitus with other circulatory complications: Secondary | ICD-10-CM

## 2023-06-08 DIAGNOSIS — Z6839 Body mass index (BMI) 39.0-39.9, adult: Secondary | ICD-10-CM

## 2023-06-08 DIAGNOSIS — I10 Essential (primary) hypertension: Secondary | ICD-10-CM | POA: Diagnosis not present

## 2023-06-08 DIAGNOSIS — N1831 Chronic kidney disease, stage 3a: Secondary | ICD-10-CM

## 2023-06-08 DIAGNOSIS — E785 Hyperlipidemia, unspecified: Secondary | ICD-10-CM

## 2023-06-08 DIAGNOSIS — I152 Hypertension secondary to endocrine disorders: Secondary | ICD-10-CM

## 2023-06-08 DIAGNOSIS — I129 Hypertensive chronic kidney disease with stage 1 through stage 4 chronic kidney disease, or unspecified chronic kidney disease: Secondary | ICD-10-CM

## 2023-06-08 DIAGNOSIS — E1169 Type 2 diabetes mellitus with other specified complication: Secondary | ICD-10-CM

## 2023-06-08 DIAGNOSIS — E1122 Type 2 diabetes mellitus with diabetic chronic kidney disease: Secondary | ICD-10-CM

## 2023-06-08 DIAGNOSIS — I700 Atherosclerosis of aorta: Secondary | ICD-10-CM

## 2023-06-08 DIAGNOSIS — E162 Hypoglycemia, unspecified: Secondary | ICD-10-CM

## 2023-06-08 LAB — COMPREHENSIVE METABOLIC PANEL

## 2023-06-08 LAB — HEMOGLOBIN A1C
Est. average glucose Bld gHb Est-mCnc: 128 mg/dL
Hgb A1c MFr Bld: 6.1 % — ABNORMAL HIGH (ref 4.8–5.6)

## 2023-06-08 MED ORDER — FUROSEMIDE 20 MG PO TABS
20.0000 mg | ORAL_TABLET | Freq: Every day | ORAL | 0 refills | Status: DC
Start: 2023-06-08 — End: 2023-09-18

## 2023-06-08 MED ORDER — AMLODIPINE BESYLATE-VALSARTAN 10-320 MG PO TABS
1.0000 | ORAL_TABLET | Freq: Every day | ORAL | 3 refills | Status: DC
Start: 2023-06-08 — End: 2023-10-04

## 2023-06-08 NOTE — Patient Instructions (Addendum)
I want you to stop the Glipizide. I will check your blood sugars today and if I feel like you need to have medication for this I will let you know and we can come up with a plan.   I want you to keep taking the fluid pill once a day.   Check your blood pressure and let me know what it is running in the next week

## 2023-06-08 NOTE — Progress Notes (Signed)
Cheryl Eth, DNP, AGNP-c [facility] [address] [place], [place] [address] Main Office [phone]   New patient visit   Patient: [name]   DOB: June 22, 1941   82 y.o. Female  MRN: [id number] Visit Date: 06/08/2023  Patient Care Team: [name], Cheryl Amabile, NP as PCP - General (Nurse Practitioner) [name], [facility] (Inactive) (Pharmacist)  Today's Vitals   06/08/23 1410  BP: 132/84  Pulse: 64  Weight: 229 lb 12.8 oz (104.2 kg)  Height: 5\' 7"  (1.702 m)   Body mass index is 35.99 kg/m.   Today's healthcare provider: [name], NP   Chief Complaint  Patient presents with   other    New pt. Recently in the hospital, had TIA's back in Dec. BP was elevated, bad head aches, low blood sugar, see's Dr. Lavona [name],    Subjective    [name] is a 82 y.o. female who presents today as a new patient to establish care.    Patient endorses the following concerns presently: Hypoglycemia [name] tells me that she is currently managing her diabetes with glipizide. She has had several instances of symptomatic hypoglycemia in the recent months with on episode of BG at 44 and another at 24. She reports she is able to tell when her blood sugars are this low and will check when she feels this way. In both instances she was able to increase her blood sugar levels appropriately.  In addition to diabetes, [name] has co-morbidities of HTN, HLD, OSA, and a history of TIA and intracranial hemorrhage.    History reviewed and reveals the following: Past Medical History:  Diagnosis Date   Abdominal bloating 02/17/2019   Abdominal pain    AKI (acute kidney injury) (HCC) 02/12/2023   Arthritis    cervical disc degeneration/ oa left knee, carpal tunnel rt wrist, adhesive capsulitis right shoulder, rt hand weakness; lumbar degeneration   Carpal tunnel syndrome    CARPAL TUNNEL SYNDROME, RIGHT 10/14/2010   Qualifier: Diagnosis of   By: Rodena Medin MD, Acie Fredrickson         CHEST PAIN, ATYPICAL 07/01/2010   Qualifier: Diagnosis of   By: Abner Greenspan MD, Stacey         Complication of anesthesia 2006-at [facility]   breathing problems-no BP med given prior to surgery;  hx of being very sleepy after colon surgery --  states no problems with last right total knee replacement 2013   Constipation    Diabetes mellitus without complication (HCC)    borderline - diet control   Diverticulitis hx of   Diverticulosis    E. coli infection    2020   Frequent UTI    hx of urethral injury during colon surgery - states frequent uti's since   GERD (gastroesophageal reflux disease)    Gout, unspecified 09/09/2007   Qualifier: Diagnosis of   By: Lovell Sheehan MD, Balinda Quails        H/O hiatal hernia    HIP PAIN, LEFT, CHRONIC 11/09/2010   Qualifier: Diagnosis of   By: Lovell Sheehan MD, Balinda Quails        History of palpitations    in the past   History of shingles    has a lingering itching on back where shingles were   Hyperlipidemia    Hypertension    Hypokalemia 10/19/2022   KNEE PAIN, RIGHT, CHRONIC 10/14/2010   Qualifier: Diagnosis of   By: Rodena Medin MD, Acie Fredrickson  Lightheaded 02/17/2019   Morbidly obese (HCC) 05/31/2011   bmi of 44     Numbness and tingling in right hand    pt. states has numbness of right hand very frequently-watch positioning   Obesity    Osteoarthritis    Osteoporosis    Pain    pain left knee and pain right hip and right groin   Pain in joint of right hip 05/11/2020   Personal history of colonic polyps-adenoma 08/26/2008   Pneumonia 2005   Pruritus 01/03/2021   Shortness of breath 09/14/2021   Shortness of breath 09/14/2021   Spasm of lumbar paraspinous muscle 06/17/2011   Stroke (HCC)    Stroke-like symptom    Subacute intracranial hemorrhage (HCC) 10/18/2022   Transient speech disturbance 04/18/2022   Urinary frequency 02/17/2019   Vaginal discharge 08/02/2011   Vitamin D deficiency    Past Surgical History:  Procedure Laterality Date    ABDOMINAL HYSTERECTOMY     bladder tack     BREAST BIOPSY Left    BREAST SURGERY     breast duct resection- benign   CARPAL TUNNEL RELEASE Right 06/04/2014   Procedure: RIGHT CARPAL TUNNEL RELEASE;  Surgeon: Dominica Severin, MD;  Location: [facility];  Service: Orthopedics;  Laterality: Right;   COLON RESECTION  2008   hx diverticulosis   COLON SURGERY     COLONOSCOPY     colonoscopy [id number]     JOINT REPLACEMENT     OOPHORECTOMY     POLYPECTOMY     skin graft left arm - traumatic compression injury left upper arm     temporary ureter stent     TOTAL KNEE ARTHROPLASTY  01/05/2012   Procedure: TOTAL KNEE ARTHROPLASTY;  Surgeon: Loanne Drilling, MD;  Location: [facility];  Service: Orthopedics;  Laterality: Right;   TOTAL KNEE ARTHROPLASTY Left 08/17/2014   Procedure: LEFT TOTAL KNEE ARTHROPLASTY;  Surgeon: Loanne Drilling, MD;  Location: [facility];  Service: Orthopedics;  Laterality: Left;   ureter repair for tyransected left ureter     Family Status  Relation Name Status   Mother  Deceased   Father  Deceased   Sister  Alive   Brother Fayrene Fearing Deceased   MGM  Deceased   MGF  Deceased   Sister  Alive   Sister  Alive   Sister  Deceased   Child  (Not Specified)   Child  (Not Specified)   Child  (Not Specified)   Son  Deceased   Neg Hx  (Not Specified)  No partnership data on file   Family History  Problem Relation Age of Onset   Breast cancer Mother 84   Hypertension Mother    Prostate cancer Father    Hypertension Father    Dementia Sister    Lung cancer Brother    Hypertension Brother    COPD Brother    Cancer Maternal Grandmother    Arthritis Sister    Hypertension Sister    Arthritis Sister    Hypertension Child    Hypertension Child    Hypertension Child    Colon polyps Neg Hx    Esophageal cancer Neg Hx    Rectal cancer Neg Hx    Stomach cancer Neg Hx    Colon cancer Neg Hx    Social History   Socioeconomic History   Marital status:  Married    Spouse name: Not on file   Number of children: 6   Years of education: Not  on file   Highest education level: Not on file  Occupational History   Occupation: DESIGNER    Employer: [employer]  Tobacco Use   Smoking status: Never   Smokeless tobacco: Never  Vaping Use   Vaping status: Never Used  Substance and Sexual Activity   Alcohol use: No   Drug use: No   Sexual activity: Not Currently  Other Topics Concern   Not on file  Social History Narrative   The patient is married and has 6 children   Operates a business   No alcohol tobacco or drug use   Social Determinants of Corporate investment banker Strain: Low Risk  (05/19/2021)   Overall Financial Resource Strain (CARDIA)    Difficulty of Paying Living Expenses: Not hard at all  Food Insecurity: No Food Insecurity (02/12/2023)   Hunger Vital Sign    Worried About Running Out of Food in the Last Year: Never true    Ran Out of Food in the Last Year: Never true  Transportation Needs: No Transportation Needs (02/12/2023)   PRAPARE - Administrator, Civil Service (Medical): No    Lack of Transportation (Non-Medical): No  Physical Activity: Inactive (05/19/2021)   Exercise Vital Sign    Days of Exercise per Week: 0 days    Minutes of Exercise per Session: 0 min  Stress: No Stress Concern Present (05/19/2021)   [employer] of Occupational Health - Occupational Stress Questionnaire    Feeling of Stress : Only a little  Social Connections: Socially Integrated (02/05/2019)   Social Connection and Isolation Panel [NHANES]    Frequency of Communication with Friends and Family: More than three times a week    Frequency of Social Gatherings with Friends and Family: More than three times a week    Attends Religious Services: More than 4 times per year    Active Member of Clubs or Organizations: Yes    Attends Engineer, structural: More than 4 times per year    Marital Status: Married    Outpatient Medications Prior to Visit  Medication Sig Note   aspirin EC 81 MG tablet Take 81 mg by mouth daily. Swallow whole.    Blood Pressure KIT 1 Units by Does not apply route daily. Check blood pressure daily for hypertension dx code I10    estradiol (ESTRACE) 0.1 MG/GM vaginal cream Place 1 Applicatorful vaginally 3 (three) times a week. 06/08/2023: prn   Lancets (ONETOUCH DELICA PLUS LANCET33G) MISC USE TO CHECK BLOOD SUGAR TWICE DAILY    rosuvastatin (CRESTOR) 40 MG tablet Take 1 tablet (40 mg total) by mouth daily.    [DISCONTINUED] glipiZIDE (GLUCOTROL) 10 MG tablet Take 5 mg by mouth daily before breakfast. 06/08/2023: BS gets low when she takes   [DISCONTINUED] hydrALAZINE (APRESOLINE) 10 MG tablet Take 10 mg by mouth daily.    [DISCONTINUED] Olmesartan-amLODIPine-HCTZ 40-10-12.5 MG TABS Take 1 tablet by mouth daily.    ONETOUCH VERIO test strip USE TO TEST 2 TIMES DAILY    solifenacin (VESICARE) 5 MG tablet Take 5 mg by mouth daily as needed (for urinary retention).    [DISCONTINUED] cephALEXin (KEFLEX) 500 MG capsule Take 1 capsule (500 mg total) by mouth 4 (four) times daily. (Patient not taking: Reported on 06/08/2023)    [DISCONTINUED] furosemide (LASIX) 20 MG tablet Take 1 tablet (20 mg total) by mouth daily for 3 days. 06/08/2023: prn   No facility-administered medications prior to visit.   Allergies  Allergen  Reactions   Bee Venom Hives   Latex Hives and Itching   Codeine Other (See Comments)    Hallucinations    Hydrocodone Other (See Comments)    hallucinations   Oxycodone Other (See Comments)    hallucinations   Immunization History  Administered Date(s) Administered   Fluad Quad(high Dose 65+) 09/01/2020, 07/12/2021   Influenza Split 07/25/2012   Influenza, High Dose Seasonal PF 10/08/2018, 10/02/2019   Influenza,inj,Quad PF,6+ Mos 11/21/2013   Influenza-Unspecified 07/25/2012, 11/21/2013   Moderna Covid-19 Vaccine Bivalent Booster 55yrs & up 07/07/2021    Moderna SARS-COV2 Booster Vaccination 10/08/2020, 10/08/2020   Moderna Sars-Covid-2 Vaccination 12/03/2019, 01/01/2020   Pneumococcal Polysaccharide-23 11/21/2013   Pneumococcal-Unspecified 11/21/2013   Td 01/29/1999, 11/21/2013    Health Maintenance Due Health Maintenance Topics with due status: Overdue     Topic Date Due   Zoster Vaccines- Shingrix Never done   Pneumonia Vaccine 62+ Years old 11/21/2014   OPHTHALMOLOGY EXAM 02/03/2022   COVID-19 Vaccine 06/30/2022   Diabetic kidney evaluation - Urine ACR 07/12/2022   Medicare Annual Wellness (AWV) 03/24/2023   Health Maintenance Topics with due status: Due On     Topic Date Due   INFLUENZA VACCINE 05/31/2023    Review of Systems All review of systems negative except what is listed in the HPI   Objective    BP 132/84   Pulse 64   Ht 5\' 7"  (1.702 m)   Wt 229 lb 12.8 oz (104.2 kg)   BMI 35.99 kg/m  Physical Exam  Results for orders placed or performed in visit on 06/08/23  Hemoglobin A1c  Result Value Ref Range   Hgb A1c MFr Bld 6.1 (H) 4.8 - 5.6 %   Est. average glucose Bld gHb Est-mCnc 128 mg/dL  Comprehensive metabolic panel  Result Value Ref Range   Glucose 221 (H) 70 - 99 mg/dL   BUN 21 8 - 27 mg/dL   Creatinine, Ser 9.14 (H) 0.57 - 1.00 mg/dL   eGFR 42 (L) >78 GN/FAO/1.30   BUN/Creatinine Ratio 16 12 - 28   Sodium 141 134 - 144 mmol/L   Potassium 4.9 3.5 - 5.2 mmol/L   Chloride 101 96 - 106 mmol/L   CO2 26 20 - 29 mmol/L   Calcium 9.7 8.7 - 10.3 mg/dL   Total Protein 7.1 6.0 - 8.5 g/dL   Albumin 4.0 3.7 - 4.7 g/dL   Globulin, Total 3.1 1.5 - 4.5 g/dL   Bilirubin Total 0.6 0.0 - 1.2 mg/dL   Alkaline Phosphatase 51 44 - 121 IU/L   AST 30 0 - 40 IU/L   ALT 25 0 - 32 IU/L    Assessment & Plan      Problem List Items Addressed This Visit     Type 2 diabetes mellitus with other specified complication (HCC) (Chronic)    >>ASSESSMENT AND PLAN FOR TYPE 2 DIABETES MELLITUS WITH STAGE 3 CHRONIC KIDNEY  DISEASE, WITHOUT LONG-TERM CURRENT USE OF INSULIN (HCC) WRITTEN ON 06/23/2023  6:28 PM BY Shaquille Janes E, NP  Chronic. In the setting of HTN, HLD, OSA, Obesty, and advanced age, increased risks are present. Recent significant events of hypoglycemic episodes on glipizide. At this time there is significant concern for the increased risks with continuation of this medication due to hypoglycemia.. Risks of elevated blood sugars are less severe at this time.  Plan: - Stop glipizide - Continue to measure blood sugar at home daily with AM fasting blood sugar. OK to take if you  have symptoms of low or high blood sugar levels - We will see what your labs show and consider adding a different medication, if needed.       Relevant Medications   amLODipine-valsartan (EXFORGE) 10-320 MG tablet   Hyperlipidemia associated with type 2 diabetes mellitus (HCC)    Chronic. Currently managed with rosuvastatin 40mg . Last labs in April showed poor control. Unclear f she was taking medication at that time. She is followed by Dr. Horris Latino with cardiology.  Plan: - Recommend continuation of current medication. - Strict low fat diet is strongly recommended to reduce cholesterol consumption. Including high fiber foods and fiber supplement daily may also help with control.       Relevant Medications   amLODipine-valsartan (EXFORGE) 10-320 MG tablet   furosemide (LASIX) 20 MG tablet   Hypertension associated with diabetes (HCC)    Chronic. Currently managed with olmesartan-amlodipine-hydrochlorothiazide combo. Better control with valsartan historically. There is a need to ensure we keep her blood pressure well controlled given her recent history of intracranial hemorrhage. Will plan to change medication and monitor closely. Consider addition of hydrochlorothiazide back to regimen if BP remains elevated. Plan: - amlodipine-valsartan for BP - monitor blood pressure daily with goal less than 130/80. - contact the office if  the readings are consistently higher than this      Relevant Medications   amLODipine-valsartan (EXFORGE) 10-320 MG tablet   furosemide (LASIX) 20 MG tablet   Hypertensive nephropathy    Chronic. Blood pressure control is vital at this time to ensure readings remain <130/80, but we avoid hypotension. Will monitor today. May consider Farxiga based on kidney function.       Aortic atherosclerosis (HCC)    Chronic. Recommend heart healthy diet.  Plan: -Avoid fatty foods, high carbohydrates, and fried foods.  - Increase fiber and healthy vegetables.  - Increase exercise.  - Continue to take statin       Relevant Medications   amLODipine-valsartan (EXFORGE) 10-320 MG tablet   furosemide (LASIX) 20 MG tablet   Class 2 severe obesity due to excess calories with serious comorbidity and body mass index (BMI) of 39.0 to 39.9 in adult [facility])    Patient has had weight loss in the past several months with management of diet. Plan: - Continue with low fat diet and controlled carbohydrate intake       Chronic kidney disease, stage 3a (HCC)    Chronic and stable. Labs pending. Consider Marcelline Deist based on results.       Hypoglycemia    Recent severe hypoglycemic episodes with significant concern. Immediate discontinuation of glipizide. Will consider alternative options after review of A1c.       Other Visit Diagnoses     Type 2 diabetes mellitus with stage 3a chronic kidney disease, without long-term current use of insulin (HCC)  (Chronic)   -  Primary   Relevant Medications   amLODipine-valsartan (EXFORGE) 10-320 MG tablet   Other Relevant Orders   Hemoglobin A1c (Completed)        Return in about 3 months (around 09/08/2023) for Med Management 45.    Time: 58 minutes, >50% spent counseling, care coordination, chart review, and documentation.    Chessa Barrasso, Cheryl Amabile, NP, DNP, AGNP-C [facility]

## 2023-06-11 NOTE — Progress Notes (Deleted)
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  LDL goal less than 70 Glycemic control.  Hgb A1c goal less than 70 Mediterranean diet.  Requests referral to dietician. Follow up 6 months     Subjective:  HAVEN KEEZER is an 82 year old female with HTN, DM II, CKD and history of TIA who follows up for recent hospital admission.  History supplemented by her accompanying husband and hospital records.  Recent MRI/MRA brain and CT head personally reviewed.  UPDATE: Current medications:  ASA 81mg , Plavix 75mg , hydralazine, olmesartan-amlodipine-HCTZ, glipizide, rosuvastatin 40mg   Seen in ED on 7/26 for generalized weakness ***.  Diagnosed with UTI and dehydration.  ***   HISTORY: On 10/13/2022, she began experiencing acute onset of new headaches, described as severe paroxysmal right occipital-temporal stabbing headaches lasting a few seconds and occurring about every 10 minutes.  No associated nausea, vomiting, new visual disturbance, or unilateral numbness or weakness.  She took her blood pressure which was in the 200s systolic.  She went to the ED on 12/18 but left before being seen.  Headaches continued, so she returned to a different ED at [facility] on 12/20 where CT head revealed a 1.3 cm hyperdense focus within the right occipital lobe suspicious for hemorrhage.  CTA of head revealed no LVO or other vascular abnormality.  CTV of head revealed no cerebral venous sinus thrombosis.  She was transferred to [facility] for continued workup and management.  MRI of brain revealed early subacute hemorrage within the right occipital lobe.  She was already on ASA 81mg , which was put  on hold.  LDL 98.  Hgb A1c 6.4.  She was admitted and monitored.  Repeat CT Head on 12/22 revealed hemorrhage was unchanged.  She was discharged in stable condition.  She is still not on ASA. Reports that she has trouble seeing, worse since the hospital but ongoing prior to the hospitalization.  She followed up with her eye doctor and exam is stable.    She began having a recurrence of headache.  On 02/10/2022, she started experiencing intermittent left arm and facial numbness.  She was admitted to [facility] where labs revealed AKI with BUN 37 and Cr 1.70.  CT head revealed no acute bleed or ischemic stroke.  MRI of brain revealed chronic small vessel ischemic changes and multiple scattered chronic microhemorrhages but no acute stroke.  MRA of head revealed no LVO or hemodynamically significant stenosis, aneurysm or other abnormality.  Carotid Doppler revealed no hemodynamically significant stenosis.  LDL was 198 and Hgb A1c 6.1.  She apparently was not taking the statin.  She was discharged on DAPT for 3 weeks and started on rosuvastatin 40mg .     10/18/2022 CT HEAD WO:  1.3 cm hyperdense focus within the right occipital lobe. This may reflect an acute parenchymal hemorrhage. However, a brain MRI without and with contrast is recommended to exclude a primary mass or metastatic lesion.  Moderate chronic small ischemic changes within the cerebral white matter. 10/18/2022 CTA/CTV HEAD:  1. No intracranial large vessel occlusion. No evidence of vascular malformation.  2. 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  3. Moderate stenosis in the right mid V4  segment.  4. No evidence of venous sinus thrombosis or stenosis. The right transverse and sigmoid sinus are hypoplastic, which appears similar to the 04/18/2022 CTA head. 10/18/2022 MRI BRAIN W WO:  1. Early subacute hemorrhage in the right occipital lobe, with a volume of approximately  1 mL. Surrounding edema without significant mass effect or midline shift. No evidence of active hemorrhage.  2. Multiple foci of hemosiderin deposition, most prominently in the parietal and occipital lobes; with the exception of the new subacute hemorrhage, the others appear unchanged compared to 04/17/2022.  While these could be the sequela of hypertensive microhemorrhages, early cerebral amyloid angiopathy or multiple cavernomas could appear similar. 10/20/2022 CT HEAD WO:  Unchanged hemorrhage in the right occipital lobe, further characterized on recent MRI. 02/12/2023 MRI BRAIN WO:  1. No acute intracranial infarct or other abnormality. 2. 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 3. Multiple scattered chronic micro hemorrhages, most pronounced about the parieto-occipital regions bilaterally. Findings are favored to be hypertensive in nature. 02/12/2023 MRA HEAD:  1. Negative intracranial MRA for large vessel occlusion.2. Mild intracranial atheromatous disease, but no hemodynamically significant or correctable stenosis.  PAST MEDICAL HISTORY: Past Medical History:  Diagnosis Date   Abdominal pain    Arthritis    cervical disc degeneration/ oa left knee, carpal tunnel rt wrist, adhesive capsulitis right shoulder, rt hand weakness; lumbar degeneration   Carpal tunnel syndrome    Complication of anesthesia 2006-at [facility]   breathing problems-no BP med given prior to surgery;  hx of being very sleepy after colon surgery --  states no problems with last right total knee replacement 2013   Constipation    Diabetes mellitus without complication (HCC)    borderline - diet control   Diverticulitis hx of   Diverticulosis    E. coli infection    2020   Frequent UTI    hx of urethral injury during colon surgery - states frequent uti's since   GERD (gastroesophageal reflux disease)    H/O hiatal hernia    History of palpitations    in the past   History of shingles     has a lingering itching on back where shingles were   Hyperlipidemia    Hypertension    Numbness and tingling in right hand    pt. states has numbness of right hand very frequently-watch positioning   Obesity    Osteoarthritis    Osteoporosis    Pain    pain left knee and pain right hip and right groin   Personal history of colonic polyps-adenoma 08/26/2008   Pneumonia 2005   Shortness of breath 09/14/2021   Stroke (HCC)    Vitamin D deficiency     MEDICATIONS: Current Outpatient Medications on File Prior to Visit  Medication Sig Dispense Refill   amLODipine-valsartan (EXFORGE) 10-320 MG tablet Take 1 tablet by mouth daily. 90 tablet 3   aspirin EC 81 MG tablet Take 81 mg by mouth daily. Swallow whole.     Blood Pressure KIT 1 Units by Does not apply route daily. Check blood pressure daily for hypertension dx code I10 1 kit 0   cephALEXin (KEFLEX) 500 MG capsule Take 1 capsule (500 mg total) by mouth 4 (four) times daily. (Patient not taking: Reported on 06/08/2023) 20 capsule 0   estradiol (ESTRACE) 0.1 MG/GM vaginal cream Place 1 Applicatorful vaginally 3 (three) times a week.     furosemide (LASIX) 20 MG tablet Take 1 tablet (20 mg total) by  mouth daily. 90 tablet 0   hydrALAZINE (APRESOLINE) 10 MG tablet Take 10 mg by mouth daily.     Lancets (ONETOUCH DELICA PLUS LANCET33G) MISC USE TO CHECK BLOOD SUGAR TWICE DAILY 100 each 3   ONETOUCH VERIO test strip USE TO TEST 2 TIMES DAILY 100 strip 3   rosuvastatin (CRESTOR) 40 MG tablet Take 1 tablet (40 mg total) by mouth daily. 90 tablet 0   solifenacin (VESICARE) 5 MG tablet Take 5 mg by mouth daily as needed (for urinary retention).     No current facility-administered medications on file prior to visit.    ALLERGIES: Allergies  Allergen Reactions   Bee Venom Hives   Latex Hives and Itching   Codeine Other (See Comments)    Hallucinations    Hydrocodone Other (See Comments)    hallucinations   Oxycodone Other (See Comments)     hallucinations    FAMILY HISTORY: Family History  Problem Relation Age of Onset   Breast cancer Mother 53   Hypertension Mother    Prostate cancer Father    Hypertension Father    Dementia Sister    Lung cancer Brother    Hypertension Brother    COPD Brother    Cancer Maternal Grandmother    Arthritis Sister    Hypertension Sister    Arthritis Sister    Hypertension Child    Hypertension Child    Hypertension Child    Colon polyps Neg Hx    Esophageal cancer Neg Hx    Rectal cancer Neg Hx    Stomach cancer Neg Hx    Colon cancer Neg Hx       Objective:  Blood pressure 120/65, pulse 71, height 5\' 6"  (1.676 m), weight 230 lb (104.3 kg), SpO2 95 %. General: No acute distress.  Patient appears well-groomed.   Head:  Normocephalic/atraumatic Eyes:  Fundi examined but not visualized Neck: supple, no paraspinal tenderness, full range of motion Heart:  Regular rate and rhythm Neurological Exam: alert and oriented to person, place, and time.  Speech fluent and not dysarthric, language intact.  Endorses slightly reduced right V1-V3 sensation.  Otherwise, CN II-XII intact. Bulk and tone normal, muscle strength 5/5 throughout.  Sensation to light touch intact.  Deep tendon reflexes 2+ throughout, toes downgoing.  Finger to nose testing intact.  Gait normal, Romberg negative.   Shon Millet, DO  CC: Chrys Racer, FNP

## 2023-06-12 ENCOUNTER — Ambulatory Visit: Payer: Medicare Other | Admitting: Neurology

## 2023-06-12 ENCOUNTER — Encounter: Payer: Self-pay | Admitting: Nurse Practitioner

## 2023-06-12 ENCOUNTER — Other Ambulatory Visit: Payer: Self-pay | Admitting: Nurse Practitioner

## 2023-06-12 DIAGNOSIS — K458 Other specified abdominal hernia without obstruction or gangrene: Secondary | ICD-10-CM

## 2023-06-15 ENCOUNTER — Ambulatory Visit: Admission: RE | Admit: 2023-06-15 | Payer: Medicare Other | Source: Ambulatory Visit

## 2023-06-15 DIAGNOSIS — K458 Other specified abdominal hernia without obstruction or gangrene: Secondary | ICD-10-CM

## 2023-06-20 ENCOUNTER — Other Ambulatory Visit: Payer: Self-pay | Admitting: Nurse Practitioner

## 2023-06-20 DIAGNOSIS — K7460 Unspecified cirrhosis of liver: Secondary | ICD-10-CM

## 2023-06-21 ENCOUNTER — Ambulatory Visit: Payer: Medicare Other | Admitting: Neurology

## 2023-06-23 ENCOUNTER — Encounter: Payer: Self-pay | Admitting: Nurse Practitioner

## 2023-06-23 DIAGNOSIS — E162 Hypoglycemia, unspecified: Secondary | ICD-10-CM | POA: Insufficient documentation

## 2023-06-23 NOTE — Assessment & Plan Note (Signed)
Chronic and stable. Labs pending. Consider Marcelline Deist based on results.

## 2023-06-23 NOTE — Assessment & Plan Note (Signed)
Chronic. Recommend heart healthy diet.  Plan: -Avoid fatty foods, high carbohydrates, and fried foods.  - Increase fiber and healthy vegetables.  - Increase exercise.  - Continue to take statin

## 2023-06-23 NOTE — Assessment & Plan Note (Signed)
Chronic. Currently managed with olmesartan-amlodipine-hydrochlorothiazide combo. Better control with valsartan historically. There is a need to ensure we keep her blood pressure well controlled given her recent history of intracranial hemorrhage. Will plan to change medication and monitor closely. Consider addition of hydrochlorothiazide back to regimen if BP remains elevated. Plan: - amlodipine-valsartan for BP - monitor blood pressure daily with goal less than 130/80. - contact the office if the readings are consistently higher than this

## 2023-06-23 NOTE — Assessment & Plan Note (Signed)
Patient has had weight loss in the past several months with management of diet. Plan: - Continue with low fat diet and controlled carbohydrate intake

## 2023-06-23 NOTE — Assessment & Plan Note (Signed)
Chronic. Blood pressure control is vital at this time to ensure readings remain <130/80, but we avoid hypotension. Will monitor today. May consider Farxiga based on kidney function.

## 2023-06-23 NOTE — Assessment & Plan Note (Addendum)
>>  ASSESSMENT AND PLAN FOR TYPE 2 DIABETES MELLITUS WITH STAGE 3 CHRONIC KIDNEY DISEASE, WITHOUT LONG-TERM CURRENT USE OF INSULIN (HCC) WRITTEN ON 06/23/2023  6:28 PM BY Mirranda Monrroy E, NP  Chronic. In the setting of HTN, HLD, OSA, Obesty, and advanced age, increased risks are present. Recent significant events of hypoglycemic episodes on glipizide. At this time there is significant concern for the increased risks with continuation of this medication due to hypoglycemia.. Risks of elevated blood sugars are less severe at this time.  Plan: - Stop glipizide - Continue to measure blood sugar at home daily with AM fasting blood sugar. OK to take if you have symptoms of low or high blood sugar levels - We will see what your labs show and consider adding a different medication, if needed.

## 2023-06-23 NOTE — Assessment & Plan Note (Signed)
Chronic. Recent significant events of hypoglycemic episodes on glipizide. At this time there is significant concern for the increased risks with continuation of this medication due to hypoglycemia.. Risks of elevated blood sugars are less severe at this time.  Plan: - Stop glipizide - Continue to measure blood sugar at home daily with AM fasting blood sugar. OK to take if you have symptoms of low or high blood sugar levels - We will see what your labs show and consider adding a different medication, if needed.

## 2023-06-23 NOTE — Assessment & Plan Note (Signed)
Recent severe hypoglycemic episodes with significant concern. Immediate discontinuation of glipizide. Will consider alternative options after review of A1c.

## 2023-06-23 NOTE — Assessment & Plan Note (Signed)
Chronic. Currently managed with rosuvastatin 40mg . Last labs in April showed poor control. Unclear f she was taking medication at that time. She is followed by Dr. Horris Latino with cardiology.  Plan: - Recommend continuation of current medication. - Strict low fat diet is strongly recommended to reduce cholesterol consumption. Including high fiber foods and fiber supplement daily may also help with control.

## 2023-06-29 ENCOUNTER — Other Ambulatory Visit: Payer: Medicare Other

## 2023-08-14 ENCOUNTER — Ambulatory Visit: Payer: Medicare Other | Admitting: Podiatry

## 2023-08-21 NOTE — Progress Notes (Deleted)
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  LDL goal less than 70 Normotensive blood pressure Hgb A1c goal less than 7 Mediterranean diet.   Follow up 6 months ***     Subjective:  [name] is an 82 year old female with HTN, DM II, CKD and history of TIA who follows up for TIA.  UPDATE: Current medications:  ASA 81mg , Plavix 75mg , hydralazine, olmesartan-amlodipine-HCTZ, glipizide, rosuvastatin 40mg   In July, she had been experiencing generalized weakness and fatigue.  She also endorsed another brief episode of left sided facial numbness but thought it was due to laying on her left side in bed.  Seen in ED.  CT head personally reviewed revealed no acute findings. She diagnosed with dehydration.     HISTORY: On 10/13/2022, she began experiencing acute onset of new headaches, described as severe paroxysmal right occipital-temporal stabbing headaches lasting a few seconds and occurring about every 10 minutes.  No associated nausea, vomiting, new visual disturbance, or unilateral numbness or weakness.  She took her blood pressure which was in the 200s systolic.  She went to the ED on 12/18 but left before being seen.  Headaches continued, so she returned to a different ED at [facility] on 12/20 where CT head revealed a 1.3 cm hyperdense focus within the right occipital lobe suspicious for hemorrhage.  CTA of head revealed no LVO or other vascular abnormality.  CTV of head revealed no cerebral venous sinus thrombosis.  She was transferred to [facility] for continued workup and management.  MRI of brain revealed early subacute hemorrage within the right occipital lobe.  She  was already on ASA 81mg , which was put on hold.  LDL 98.  Hgb A1c 6.4.  She was admitted and monitored.  Repeat CT Head on 12/22 revealed hemorrhage was unchanged.  She was discharged in stable condition.  She is still not on ASA. Reports that she has trouble seeing, worse since the hospital but ongoing prior to the hospitalization.  She followed up with her eye doctor and exam is stable.     10/18/2022 CT HEAD WO:  1.3 cm hyperdense focus within the right occipital lobe. This may reflect an acute parenchymal hemorrhage. However, a brain MRI without and with contrast is recommended to exclude a primary mass or metastatic lesion.  Moderate chronic small ischemic changes within the cerebral white matter. 10/18/2022 CTA/CTV HEAD:  1. No intracranial large vessel occlusion. No evidence of vascular malformation.  2. 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  3. Moderate stenosis in the right mid V4 segment.  4. No evidence of venous sinus thrombosis or stenosis. The right transverse and sigmoid sinus are hypoplastic, which appears similar to the 04/18/2022 CTA head. 10/18/2022 MRI BRAIN W WO:  1. Early subacute hemorrhage in the right occipital lobe, with a volume of approximately 1 mL. Surrounding edema without significant mass effect or midline shift. No evidence of active hemorrhage.  2. Multiple foci of hemosiderin deposition, most prominently in the parietal and occipital lobes; with the exception of the new subacute hemorrhage, the others appear unchanged compared to 04/17/2022.  While these could be the sequela of hypertensive microhemorrhages, early cerebral amyloid angiopathy  or multiple cavernomas could appear similar. 10/20/2022 CT HEAD WO:  Unchanged hemorrhage in the right occipital lobe, further characterized on recent MRI.  She began having a recurrence of headache.  On 02/11/2023, she started experiencing intermittent left arm and  facial numbness.  She was admitted to [facility] where labs revealed AKI with BUN 37 and Cr 1.70.  CT head revealed no acute bleed or ischemic stroke.  MRI of brain revealed chronic small vessel ischemic changes and multiple scattered chronic microhemorrhages but no acute stroke.  MRA of head revealed no LVO or hemodynamically significant stenosis, aneurysm or other abnormality.  Carotid Doppler revealed no hemodynamically significant stenosis.  LDL was 198 and Hgb A1c 6.1.  She apparently was not taking the statin.  She was discharged on DAPT for 3 weeks and started on rosuvastatin 40mg .  Feeling better.  PAST MEDICAL HISTORY: Past Medical History:  Diagnosis Date   Abdominal bloating 02/17/2019   Abdominal pain    AKI (acute kidney injury) (HCC) 02/12/2023   Arthritis    cervical disc degeneration/ oa left knee, carpal tunnel rt wrist, adhesive capsulitis right shoulder, rt hand weakness; lumbar degeneration   Carpal tunnel syndrome    CARPAL TUNNEL SYNDROME, RIGHT 10/14/2010   Qualifier: Diagnosis of   By: Rodena Medin MD, Acie Fredrickson        CHEST PAIN, ATYPICAL 07/01/2010   Qualifier: Diagnosis of   By: Abner Greenspan MD, Stacey         Complication of anesthesia 2006-at [facility]   breathing problems-no BP med given prior to surgery;  hx of being very sleepy after colon surgery --  states no problems with last right total knee replacement 2013   Constipation    Diabetes mellitus without complication (HCC)    borderline - diet control   Diverticulitis hx of   Diverticulosis    E. coli infection    2020   Frequent UTI    hx of urethral injury during colon surgery - states frequent uti's since   GERD (gastroesophageal reflux disease)    Gout, unspecified 09/09/2007   Qualifier: Diagnosis of   By: Lovell Sheehan MD, Balinda Quails        H/O hiatal hernia    HIP PAIN, LEFT, CHRONIC 11/09/2010   Qualifier: Diagnosis of   By: Lovell Sheehan MD, Balinda Quails        History of palpitations    in the past   History of  shingles    has a lingering itching on back where shingles were   Hyperlipidemia    Hypertension    Hypokalemia 10/19/2022   KNEE PAIN, RIGHT, CHRONIC 10/14/2010   Qualifier: Diagnosis of   By: Rodena Medin MD, Acie Fredrickson        Lightheaded 02/17/2019   Morbidly obese (HCC) 05/31/2011   bmi of 44     Numbness and tingling in right hand    pt. states has numbness of right hand very frequently-watch positioning   Obesity    Osteoarthritis    Osteoporosis    Pain    pain left knee and pain right hip and right groin   Pain in joint of right hip 05/11/2020   Personal history of colonic polyps-adenoma 08/26/2008   Pneumonia 2005   Pruritus 01/03/2021   Shortness of breath 09/14/2021   Shortness of breath 09/14/2021   Spasm of lumbar paraspinous muscle 06/17/2011   Stroke (HCC)    Stroke-like symptom    Subacute intracranial hemorrhage (HCC) 10/18/2022  Transient speech disturbance 04/18/2022   Urinary frequency 02/17/2019   Vaginal discharge 08/02/2011   Vitamin D deficiency     MEDICATIONS: Current Outpatient Medications on File Prior to Visit  Medication Sig Dispense Refill   amLODipine-valsartan (EXFORGE) 10-320 MG tablet Take 1 tablet by mouth daily. 90 tablet 3   aspirin EC 81 MG tablet Take 81 mg by mouth daily. Swallow whole.     Blood Pressure KIT 1 Units by Does not apply route daily. Check blood pressure daily for hypertension dx code I10 1 kit 0   estradiol (ESTRACE) 0.1 MG/GM vaginal cream Place 1 Applicatorful vaginally 3 (three) times a week.     furosemide (LASIX) 20 MG tablet Take 1 tablet (20 mg total) by mouth daily. 90 tablet 0   Lancets (ONETOUCH DELICA PLUS LANCET33G) MISC USE TO CHECK BLOOD SUGAR TWICE DAILY 100 each 3   ONETOUCH VERIO test strip USE TO TEST 2 TIMES DAILY 100 strip 3   rosuvastatin (CRESTOR) 40 MG tablet Take 1 tablet (40 mg total) by mouth daily. 90 tablet 0   solifenacin (VESICARE) 5 MG tablet Take 5 mg by mouth daily as needed (for urinary  retention).     No current facility-administered medications on file prior to visit.    ALLERGIES: Allergies  Allergen Reactions   Bee Venom Hives   Latex Hives and Itching   Codeine Other (See Comments)    Hallucinations    Hydrocodone Other (See Comments)    hallucinations   Oxycodone Other (See Comments)    hallucinations    FAMILY HISTORY: Family History  Problem Relation Age of Onset   Breast cancer Mother 63   Hypertension Mother    Prostate cancer Father    Hypertension Father    Dementia Sister    Lung cancer Brother    Hypertension Brother    COPD Brother    Cancer Maternal Grandmother    Arthritis Sister    Hypertension Sister    Arthritis Sister    Hypertension Child    Hypertension Child    Hypertension Child    Colon polyps Neg Hx    Esophageal cancer Neg Hx    Rectal cancer Neg Hx    Stomach cancer Neg Hx    Colon cancer Neg Hx       Objective:  ***. General: No acute distress.  Patient appears well-groomed.   Head:  Normocephalic/atraumatic Eyes:  Fundi examined but not visualized Neck: supple, no paraspinal tenderness, full range of motion Heart:  Regular rate and rhythm Neurological Exam: ***   Cheryl Millet, DO  CC: Chrys Racer, FNP

## 2023-08-22 ENCOUNTER — Ambulatory Visit: Payer: Medicare Other | Admitting: Neurology

## 2023-08-22 DIAGNOSIS — Z029 Encounter for administrative examinations, unspecified: Secondary | ICD-10-CM

## 2023-08-23 ENCOUNTER — Encounter: Payer: Self-pay | Admitting: Neurology

## 2023-09-05 ENCOUNTER — Ambulatory Visit: Payer: Medicare Other | Admitting: Podiatry

## 2023-09-05 ENCOUNTER — Encounter: Payer: Self-pay | Admitting: Podiatry

## 2023-09-05 DIAGNOSIS — B351 Tinea unguium: Secondary | ICD-10-CM

## 2023-09-05 DIAGNOSIS — M79609 Pain in unspecified limb: Secondary | ICD-10-CM | POA: Diagnosis not present

## 2023-09-05 DIAGNOSIS — E1122 Type 2 diabetes mellitus with diabetic chronic kidney disease: Secondary | ICD-10-CM | POA: Diagnosis not present

## 2023-09-05 DIAGNOSIS — N1831 Chronic kidney disease, stage 3a: Secondary | ICD-10-CM | POA: Diagnosis not present

## 2023-09-05 DIAGNOSIS — L84 Corns and callosities: Secondary | ICD-10-CM

## 2023-09-05 NOTE — Progress Notes (Signed)
This patient returns to my office for at risk foot care.  This patient requires this care by a professional since this patient will be at risk due to having diabetes and CKD.  This patient is unable to cut nails himself since the patient cannot reach his nails.These nails are painful walking and wearing shoes.  She also has painful corns on her second and third toes right foot. This patient presents for at risk foot care today.  General Appearance  Alert, conversant and in no acute stress.  Vascular  Dorsalis pedis and posterior tibial  pulses are palpable  bilaterally.  Capillary return is within normal limits  bilaterally. Temperature is within normal limits  bilaterally.  Neurologic  Senn-Weinstein monofilament wire test within normal limits  bilaterally. Muscle power within normal limits bilaterally.  Nails Thick disfigured discolored nails with subungual debris  from hallux to fifth toes bilaterally. No evidence of bacterial infection or drainage bilaterally.  Orthopedic  No limitations of motion  feet .  No crepitus or effusions noted.  No bony pathology or digital deformities noted.  Skin  normotropic skin with no porokeratosis noted bilaterally.  No signs of infections or ulcers noted.  Corns second and third toes right foot.   Onychomycosis  Pain in right toes  Pain in left toes  Corns 2,3 right foot.  Consent was obtained for treatment procedures.   Mechanical debridement of nails 1-5  bilaterally performed with a nail nipper.  Filed with dremel without incident.    Return office visit   3 months                   Told patient to return for periodic foot care and evaluation due to potential at risk complications.   Helane Gunther DPM

## 2023-09-10 ENCOUNTER — Encounter: Payer: Medicare Other | Admitting: Nurse Practitioner

## 2023-09-18 ENCOUNTER — Other Ambulatory Visit: Payer: Self-pay | Admitting: Nurse Practitioner

## 2023-09-18 DIAGNOSIS — I10 Essential (primary) hypertension: Secondary | ICD-10-CM

## 2023-09-19 ENCOUNTER — Emergency Department (HOSPITAL_BASED_OUTPATIENT_CLINIC_OR_DEPARTMENT_OTHER): Payer: Medicare Other

## 2023-09-19 ENCOUNTER — Emergency Department (HOSPITAL_BASED_OUTPATIENT_CLINIC_OR_DEPARTMENT_OTHER)
Admission: EM | Admit: 2023-09-19 | Discharge: 2023-09-20 | Disposition: A | Discharge status: Home / Self Care | Payer: Medicare Other | Attending: Emergency Medicine | Admitting: Emergency Medicine

## 2023-09-19 ENCOUNTER — Emergency Department (HOSPITAL_BASED_OUTPATIENT_CLINIC_OR_DEPARTMENT_OTHER): Payer: Medicare Other | Admitting: Radiology

## 2023-09-19 ENCOUNTER — Other Ambulatory Visit: Payer: Self-pay

## 2023-09-19 ENCOUNTER — Encounter (HOSPITAL_BASED_OUTPATIENT_CLINIC_OR_DEPARTMENT_OTHER): Payer: Self-pay | Admitting: Emergency Medicine

## 2023-09-19 DIAGNOSIS — Z91040 Latex allergy status: Secondary | ICD-10-CM | POA: Diagnosis not present

## 2023-09-19 DIAGNOSIS — R600 Localized edema: Secondary | ICD-10-CM | POA: Insufficient documentation

## 2023-09-19 DIAGNOSIS — E119 Type 2 diabetes mellitus without complications: Secondary | ICD-10-CM | POA: Insufficient documentation

## 2023-09-19 DIAGNOSIS — I10 Essential (primary) hypertension: Secondary | ICD-10-CM | POA: Diagnosis not present

## 2023-09-19 DIAGNOSIS — Z7982 Long term (current) use of aspirin: Secondary | ICD-10-CM | POA: Diagnosis not present

## 2023-09-19 DIAGNOSIS — K8020 Calculus of gallbladder without cholecystitis without obstruction: Secondary | ICD-10-CM | POA: Insufficient documentation

## 2023-09-19 DIAGNOSIS — Z79899 Other long term (current) drug therapy: Secondary | ICD-10-CM | POA: Insufficient documentation

## 2023-09-19 DIAGNOSIS — Z8673 Personal history of transient ischemic attack (TIA), and cerebral infarction without residual deficits: Secondary | ICD-10-CM | POA: Diagnosis not present

## 2023-09-19 DIAGNOSIS — R079 Chest pain, unspecified: Secondary | ICD-10-CM | POA: Diagnosis present

## 2023-09-19 LAB — CBC
HCT: 43.8 % (ref 36.0–46.0)
Hemoglobin: 14.7 g/dL (ref 12.0–15.0)
MCH: 29.2 pg (ref 26.0–34.0)
MCHC: 33.6 g/dL (ref 30.0–36.0)
MCV: 87.1 fL (ref 80.0–100.0)
Platelets: 212 10*3/uL (ref 150–400)
RBC: 5.03 MIL/uL (ref 3.87–5.11)
RDW: 13.4 % (ref 11.5–15.5)
WBC: 9.6 10*3/uL (ref 4.0–10.5)
nRBC: 0 % (ref 0.0–0.2)

## 2023-09-19 LAB — HEPATIC FUNCTION PANEL
ALT: 20 U/L (ref 0–44)
AST: 26 U/L (ref 15–41)
Albumin: 3.5 g/dL (ref 3.5–5.0)
Alkaline Phosphatase: 37 U/L — ABNORMAL LOW (ref 38–126)
Bilirubin, Direct: 0.1 mg/dL (ref 0.0–0.2)
Indirect Bilirubin: 0.6 mg/dL (ref 0.3–0.9)
Total Bilirubin: 0.7 mg/dL (ref <1.2)
Total Protein: 6.5 g/dL (ref 6.5–8.1)

## 2023-09-19 LAB — BRAIN NATRIURETIC PEPTIDE: B Natriuretic Peptide: 96.2 pg/mL (ref 0.0–100.0)

## 2023-09-19 LAB — BASIC METABOLIC PANEL
Anion gap: 9 (ref 5–15)
BUN: 26 mg/dL — ABNORMAL HIGH (ref 8–23)
CO2: 26 mmol/L (ref 22–32)
Calcium: 9.2 mg/dL (ref 8.9–10.3)
Chloride: 103 mmol/L (ref 98–111)
Creatinine, Ser: 1.04 mg/dL — ABNORMAL HIGH (ref 0.44–1.00)
GFR, Estimated: 54 mL/min — ABNORMAL LOW (ref >60)
Glucose, Bld: 110 mg/dL — ABNORMAL HIGH (ref 70–99)
Potassium: 3.9 mmol/L (ref 3.5–5.1)
Sodium: 138 mmol/L (ref 135–145)

## 2023-09-19 LAB — D-DIMER, QUANTITATIVE: D-Dimer, Quant: 0.72 ug{FEU}/mL — ABNORMAL HIGH (ref 0.00–0.50)

## 2023-09-19 LAB — TROPONIN I (HIGH SENSITIVITY)
Troponin I (High Sensitivity): 9 ng/L (ref <18)
Troponin I (High Sensitivity): 9 ng/L (ref <18)

## 2023-09-19 NOTE — ED Provider Notes (Signed)
[place] EMERGENCY DEPARTMENT AT [facility] Provider Note   CSN: [id number] Arrival date & time: 09/19/23  1732     History  Chief Complaint  Patient presents with   Chest Pain    [name] is a 82 y.o. female.  Patient is an 82 year old female with a history of TIA, diabetes, hypertension, hyperlipidemia, atypical chest pain who is presenting today with complaints of intermittent episodes of chest pain and shortness of breath.  Patient reports she has noticed this for the last 3 days.  She reports she has had approximately 2 episodes a day for the last 3 days.  She had an episode last night where she reports it feels like there is a knot in the center of her chest and then it starts hurting it feels like her heart is racing and she feels short of breath.  She states she took an extra blood pressure pill last night because she thought maybe her blood pressure was up and went to bed.  She when she woke up this morning it was normal.  She went to work today and then around 4-5 PM this evening she developed the same symptoms and reports they are still present now but she does not feel like her heart is racing.  For the last 1 to 2 months she has noticed swelling in her lower extremities and reports she is taken her diuretic but they have not completely gone away.  She has no calf pain or redness.  No prior history of blood clots she does take an aspirin every day.  She denies history of cardiac disease and had an echo earlier this year when she was being evaluated for TIA that showed an EF of 50 to 55% but no other acute abnormalities.  She reports some of her medications had been discontinued and she is currently between doctors but otherwise reports she has been compliant with the medicine she is supposed to be on.  She has not had new cough, fever, abdominal pain, nausea or vomiting.  The history is provided by the patient.  Chest Pain      Home Medications Prior to  Admission medications   Medication Sig Start Date End Date Taking? Authorizing Provider  amLODipine-valsartan (EXFORGE) 10-320 MG tablet Take 1 tablet by mouth daily. 06/08/23   [name], NP  aspirin EC 81 MG tablet Take 81 mg by mouth daily. Swallow whole.    [provider]  Blood Pressure KIT 1 Units by Does not apply route daily. Check blood pressure daily for hypertension dx code I10 09/14/21   Chilton Si, MD  estradiol (ESTRACE) 0.1 MG/GM vaginal cream Place 1 Applicatorful vaginally 3 (three) times a week. 09/14/22   [provider]  furosemide (LASIX) 20 MG tablet TAKE 1 TABLET(20 MG) BY MOUTH DAILY 09/18/23   Early, Sung Amabile, NP  Lancets (ONETOUCH DELICA PLUS LANCET33G) MISC USE TO CHECK BLOOD SUGAR TWICE DAILY 12/24/20   Dorothyann Peng, MD  [facility] VERIO test strip USE TO TEST 2 TIMES DAILY 12/20/20   Dorothyann Peng, MD  rosuvastatin (CRESTOR) 40 MG tablet Take 1 tablet (40 mg total) by mouth daily. 02/13/23   Arnetha Courser, MD  solifenacin (VESICARE) 5 MG tablet Take 5 mg by mouth daily as needed (for urinary retention). 02/09/22   [provider]      Allergies    Bee venom, Latex, Codeine, Hydrocodone, and Oxycodone    Review of Systems   Review of  Systems  Cardiovascular:  Positive for chest pain.    Physical Exam Updated Vital Signs BP 137/73   Pulse 69   Temp 98.1 F (36.7 C) (Oral)   Resp 17   Ht 5\' 7"  (1.702 m)   Wt 104.2 kg   SpO2 94%   BMI 35.98 kg/m  Physical Exam Vitals and nursing note reviewed.  Constitutional:      General: She is not in acute distress.    Appearance: She is well-developed.  HENT:     Head: Normocephalic and atraumatic.  Eyes:     Pupils: Pupils are equal, round, and reactive to light.  Cardiovascular:     Rate and Rhythm: Normal rate and regular rhythm.     Heart sounds: Normal heart sounds. No murmur heard.    No friction rub.  Pulmonary:     Effort: Pulmonary effort is normal.     Breath  sounds: Normal breath sounds. No wheezing.     Comments: Mild crackles in the bases bilaterally Chest:     Chest wall: No tenderness.  Abdominal:     General: Bowel sounds are normal. There is no distension.     Palpations: Abdomen is soft.     Tenderness: There is no abdominal tenderness. There is no guarding or rebound.  Musculoskeletal:        General: No tenderness. Normal range of motion.     Right lower leg: Edema present.     Left lower leg: Edema present.     Comments: No edema  Skin:    General: Skin is warm and dry.     Findings: No rash.  Neurological:     Mental Status: She is alert and oriented to person, place, and time.     Cranial Nerves: No cranial nerve deficit.  Psychiatric:        Behavior: Behavior normal.     ED Results / Procedures / Treatments   Labs (all labs ordered are listed, but only abnormal results are displayed) Labs Reviewed  BASIC METABOLIC PANEL - Abnormal; Notable for the following components:      Result Value   Glucose, Bld 110 (*)    BUN 26 (*)    Creatinine, Ser 1.04 (*)    GFR, Estimated 54 (*)    All other components within normal limits  D-DIMER, QUANTITATIVE - Abnormal; Notable for the following components:   D-Dimer, Quant 0.72 (*)    All other components within normal limits  HEPATIC FUNCTION PANEL - Abnormal; Notable for the following components:   Alkaline Phosphatase 37 (*)    All other components within normal limits  CBC  BRAIN NATRIURETIC PEPTIDE  TROPONIN I (HIGH SENSITIVITY)  TROPONIN I (HIGH SENSITIVITY)    EKG EKG Interpretation Date/Time:  Wednesday September 19 2023 17:42:57 EST Ventricular Rate:  72 PR Interval:  140 QRS Duration:  152 QT Interval:  426 QTC Calculation: 466 R Axis:   82  Text Interpretation: Normal sinus rhythm Left bundle branch block No significant change since last tracing When compared with ECG of 25-May-2023 10:30, Questionable change in QRS axis Confirmed by Gwyneth Sprout  ([id number]) on 09/19/2023 6:40:13 PM  Radiology US Abdomen Limited RUQ (LIVER/GB)  Result Date: 09/19/2023 CLINICAL DATA:  Right upper quadrant pain. EXAM: ULTRASOUND ABDOMEN LIMITED RIGHT UPPER QUADRANT COMPARISON:  Noncontrast CT 01/13/2023 FINDINGS: Gallbladder: Physiologically distended. Gallstones without gallbladder wall thickening. No sonographic Murphy sign noted by sonographer. Common bile duct: Diameter: 7 mm, upper normal. Question of  echogenic foci in the wall of the common bile duct but no convincing choledocholithiasis. Liver: No focal lesion identified. Heterogeneously increased in parenchymal echogenicity. No definite capsular nodularity. Portal vein is patent on color Doppler imaging with normal direction of blood flow towards the liver. Other: No right upper quadrant ascites. IMPRESSION: 1. Gallstones without sonographic findings of acute cholecystitis. 2. Upper normal common bile duct at 7 mm. Question of echogenic foci in the wall of the common bile duct but no convincing choledocholithiasis. This could be further assessed with MRCP as clinically indicated. 3. Hepatic steatosis. Electronically Signed   By: Narda Rutherford M.D.   On: 09/19/2023 23:17   DG Chest 2 View  Result Date: 09/19/2023 CLINICAL DATA:  Chest pain.  Shortness of breath. EXAM: CHEST - 2 VIEW COMPARISON:  Chest radiograph 03/15/2023 FINDINGS: The cardiomediastinal contours are normal. Chronic eventration of right hemidiaphragm. Pulmonary vasculature is normal. No consolidation, pleural effusion, or pneumothorax. No acute osseous abnormalities are seen. IMPRESSION: No acute findings.  Chronic eventration of right hemidiaphragm. Electronically Signed   By: Narda Rutherford M.D.   On: 09/19/2023 20:56    Procedures Procedures    Medications Ordered in ED Medications - No data to display  ED Course/ Medical Decision Making/ A&P                                 Medical Decision Making Amount and/or Complexity of  Data Reviewed External Data Reviewed: notes. Labs: ordered. Decision-making details documented in ED Course. Radiology: ordered and independent interpretation performed. Decision-making details documented in ED Course. ECG/medicine tests: ordered and independent interpretation performed. Decision-making details documented in ED Course.   Pt with multiple medical problems and comorbidities and presenting today with a complaint that caries a high risk for morbidity and mortality.  Here today with complaints of intermittent chest pain/palpitation/shortness of breath.  It can occur at rest and it did occur today when she was walking out of work.  Patient denies having palpitations at this moment and she is currently on continuous cardiac monitoring which I independently interpreted is in sinus rhythm.  She has no findings concerning for zoster or chest mass.  No specific abdominal pain.  Concern for ACS, PE but low risk [place] and can have a D-dimer, CHF, pericardial effusion.  Lower suspicion for infectious etiology such as pneumonia.  No history of COPD or asthma no wheezing on exam.  11:35 PM I independently interpreted patient's EKG and labs.  EKG today without acute findings.  CBC, age-adjusted D-dimer, troponin x 2, BNP, BMP are all negative without acute findings.  I have independently visualized and interpreted pt's images today.  With chronic diaphragm elevation but no acute findings on chest x-ray.  On repeat evaluation with further history patient reports it does seem that most of the symptoms started after eating and today it started after she was eating chicken pot pie actually even when she was eating it.  She does complain of some right upper quadrant pain at times.  Could be that this is gastritis/GERD versus gallbladder disease.  Will do ultrasound to ensure no signs of cholelithiasis that could be explaining her symptoms.  11:35 PM Ultrasound today does show gallstones.  Radiology reports  no sonographic findings of acute cholecystitis and upper to normal common bile duct at 7 mm but no convincing choledocholithiasis.  Patient has normal LFTs and total bilirubin without obstructive symptoms at this  time.  Feel that patient is stable for discharge home today but will need surgical follow-up.  She was given that information.  She was also given return precautions.  She is comfortable with this plan and stable for discharge at this time.          Final Clinical Impression(s) / ED Diagnoses Final diagnoses:  Calculus of gallbladder without cholecystitis without obstruction    Rx / DC Orders ED Discharge Orders     None         Gwyneth Sprout, MD 09/19/23 [id number]

## 2023-09-19 NOTE — Discharge Instructions (Addendum)
Avoid eating fried and fatty foods.  Stick with grilled meats and baked vegetables.  Do small portions at a time.  If you start having recurrent pain and it is not going away starts causing you to have vomiting or any fever return to the emergency room.  All the blood work today looked normal of your heart, kidneys, liver.

## 2023-09-19 NOTE — ED Triage Notes (Signed)
Pt via pov from home with cp/palpitations x 3 days. Pt state it is worse today. Endorses sob; coughing at night. Pt has had some nasal congestion and cough, fatigue as well. Pt alert & oriented, nad noted.

## 2023-09-25 ENCOUNTER — Ambulatory Visit: Payer: Medicare Other

## 2023-09-25 DIAGNOSIS — Z0000 Encounter for general adult medical examination without abnormal findings: Secondary | ICD-10-CM

## 2023-09-25 NOTE — Progress Notes (Signed)
Subjective:   [name] is a 82 y.o. female who presents for Medicare Annual (Subsequent) preventive examination.  Visit Complete: Virtual I connected with  [name] on 09/25/23 by a audio enabled telemedicine application and verified that I am speaking with the correct person using two identifiers.  Patient Location: Home  Provider Location: Office/Clinic  I discussed the limitations of evaluation and management by telemedicine. The patient expressed understanding and agreed to proceed.  Vital Signs: Because this visit was a virtual/telehealth visit, some criteria may be missing or patient reported. Any vitals not documented were not able to be obtained and vitals that have been documented are patient reported.  Patient Medicare AWV questionnaire was completed by the patient on 09/19/2023; I have confirmed that all information answered by patient is correct and no changes since this date.  Cardiac Risk Factors include: advanced age (>99men, >49 women);diabetes mellitus;dyslipidemia;hypertension     Objective:    Today's Vitals   09/25/23 1157  PainSc: 7    There is no height or weight on file to calculate BMI.     09/25/2023   12:08 PM 09/19/2023    5:46 PM 03/15/2023    9:25 PM 02/15/2023    9:04 AM 02/12/2023    2:12 PM 02/11/2023    8:40 PM 10/19/2022    7:00 PM  Advanced Directives  Does Patient Have a Medical Advance Directive? No No No No No No No  Would patient like information on creating a medical advance directive?   No - Patient declined  Yes (Inpatient - patient requests chaplain consult to create a medical advance directive) No - Patient declined No - Patient declined    Current Medications (verified) Outpatient Encounter Medications as of 09/25/2023  Medication Sig   amLODipine-valsartan (EXFORGE) 10-320 MG tablet Take 1 tablet by mouth daily.   aspirin EC 81 MG tablet Take 81 mg by mouth daily. Swallow whole.   Blood Pressure KIT 1 Units by  Does not apply route daily. Check blood pressure daily for hypertension dx code I10   estradiol (ESTRACE) 0.1 MG/GM vaginal cream Place 1 Applicatorful vaginally 3 (three) times a week.   furosemide (LASIX) 20 MG tablet TAKE 1 TABLET(20 MG) BY MOUTH DAILY   Lancets (ONETOUCH DELICA PLUS LANCET33G) MISC USE TO CHECK BLOOD SUGAR TWICE DAILY   ONETOUCH VERIO test strip USE TO TEST 2 TIMES DAILY   rosuvastatin (CRESTOR) 40 MG tablet Take 1 tablet (40 mg total) by mouth daily.   solifenacin (VESICARE) 5 MG tablet Take 5 mg by mouth daily as needed (for urinary retention). (Patient not taking: Reported on 09/25/2023)   No facility-administered encounter medications on file as of 09/25/2023.    Allergies (verified) Bee venom, Latex, Codeine, Hydrocodone, and Oxycodone   History: Past Medical History:  Diagnosis Date   Abdominal bloating 02/17/2019   Abdominal pain    AKI (acute kidney injury) (HCC) 02/12/2023   Arthritis    cervical disc degeneration/ oa left knee, carpal tunnel rt wrist, adhesive capsulitis right shoulder, rt hand weakness; lumbar degeneration   Carpal tunnel syndrome    CARPAL TUNNEL SYNDROME, RIGHT 10/14/2010   Qualifier: Diagnosis of   By: Rodena Medin MD, Acie Fredrickson        CHEST PAIN, ATYPICAL 07/01/2010   Qualifier: Diagnosis of   By: Abner Greenspan MD, Stacey         Complication of anesthesia 2006-at [facility]   breathing problems-no BP med given prior to surgery;  hx  of being very sleepy after colon surgery --  states no problems with last right total knee replacement 2013   Constipation    Diabetes mellitus without complication (HCC)    borderline - diet control   Diverticulitis hx of   Diverticulosis    E. coli infection    2020   Frequent UTI    hx of urethral injury during colon surgery - states frequent uti's since   GERD (gastroesophageal reflux disease)    Gout, unspecified 09/09/2007   Qualifier: Diagnosis of   By: Lovell Sheehan MD, Balinda Quails        H/O hiatal hernia    HIP  PAIN, LEFT, CHRONIC 11/09/2010   Qualifier: Diagnosis of   By: Lovell Sheehan MD, Balinda Quails        History of palpitations    in the past   History of shingles    has a lingering itching on back where shingles were   Hyperlipidemia    Hypertension    Hypokalemia 10/19/2022   KNEE PAIN, RIGHT, CHRONIC 10/14/2010   Qualifier: Diagnosis of   By: Rodena Medin MD, Acie Fredrickson        Lightheaded 02/17/2019   Morbidly obese (HCC) 05/31/2011   bmi of 45     Numbness and tingling in right hand    pt. states has numbness of right hand very frequently-watch positioning   Obesity    Osteoarthritis    Osteoporosis    Pain    pain left knee and pain right hip and right groin   Pain in joint of right hip 05/11/2020   Personal history of colonic polyps-adenoma 08/26/2008   Pneumonia 2005   Pruritus 01/03/2021   Shortness of breath 09/14/2021   Shortness of breath 09/14/2021   Spasm of lumbar paraspinous muscle 06/17/2011   Stroke (HCC)    Stroke-like symptom    Subacute intracranial hemorrhage (HCC) 10/18/2022   Transient speech disturbance 04/18/2022   Urinary frequency 02/17/2019   Vaginal discharge 08/02/2011   Vitamin D deficiency    Past Surgical History:  Procedure Laterality Date   ABDOMINAL HYSTERECTOMY     bladder tack     BREAST BIOPSY Left    BREAST SURGERY     breast duct resection- benign   CARPAL TUNNEL RELEASE Right 06/04/2014   Procedure: RIGHT CARPAL TUNNEL RELEASE;  Surgeon: Dominica Severin, MD;  Location: [facility];  Service: Orthopedics;  Laterality: Right;   COLON RESECTION  2008   hx diverticulosis   COLON SURGERY     COLONOSCOPY     colonoscopy [id number]     JOINT REPLACEMENT     OOPHORECTOMY     POLYPECTOMY     skin graft left arm - traumatic compression injury left upper arm     temporary ureter stent     TOTAL KNEE ARTHROPLASTY  01/05/2012   Procedure: TOTAL KNEE ARTHROPLASTY;  Surgeon: Loanne Drilling, MD;  Location: [facility];  Service: Orthopedics;   Laterality: Right;   TOTAL KNEE ARTHROPLASTY Left 08/17/2014   Procedure: LEFT TOTAL KNEE ARTHROPLASTY;  Surgeon: Loanne Drilling, MD;  Location: [facility];  Service: Orthopedics;  Laterality: Left;   ureter repair for tyransected left ureter     Family History  Problem Relation Age of Onset   Breast cancer Mother 67   Hypertension Mother    Prostate cancer Father    Hypertension Father    Dementia Sister    Lung cancer Brother    Hypertension Brother  COPD Brother    Cancer Maternal Grandmother    Arthritis Sister    Hypertension Sister    Arthritis Sister    Hypertension Child    Hypertension Child    Hypertension Child    Colon polyps Neg Hx    Esophageal cancer Neg Hx    Rectal cancer Neg Hx    Stomach cancer Neg Hx    Colon cancer Neg Hx    Social History   Socioeconomic History   Marital status: Married    Spouse name: Not on file   Number of children: 6   Years of education: Not on file   Highest education level: Not on file  Occupational History   Occupation: DESIGNER    Employer: [employer]  Tobacco Use   Smoking status: Never   Smokeless tobacco: Never  Vaping Use   Vaping status: Never Used  Substance and Sexual Activity   Alcohol use: No   Drug use: No   Sexual activity: Not Currently  Other Topics Concern   Not on file  Social History Narrative   The patient is married and has 6 children   Operates a business   No alcohol tobacco or drug use   Social Determinants of Corporate investment banker Strain: Low Risk  (09/19/2023)   Overall Financial Resource Strain (CARDIA)    Difficulty of Paying Living Expenses: Not hard at all  Food Insecurity: No Food Insecurity (09/19/2023)   Hunger Vital Sign    Worried About Running Out of Food in the Last Year: Never true    Ran Out of Food in the Last Year: Never true  Transportation Needs: No Transportation Needs (09/19/2023)   PRAPARE - Administrator, Civil Service (Medical): No     Lack of Transportation (Non-Medical): No  Physical Activity: Inactive (09/19/2023)   Exercise Vital Sign    Days of Exercise per Week: 0 days    Minutes of Exercise per Session: 0 min  Stress: Stress Concern Present (09/19/2023)   [employer] of Occupational Health - Occupational Stress Questionnaire    Feeling of Stress : To some extent  Social Connections: Unknown (09/19/2023)   Social Connection and Isolation Panel [NHANES]    Frequency of Communication with Friends and Family: More than three times a week    Frequency of Social Gatherings with Friends and Family: Once a week    Attends Religious Services: Not on Marketing executive or Organizations: Yes    Attends Engineer, structural: More than 4 times per year    Marital Status: Married    Tobacco Counseling Counseling given: Not Answered   Clinical Intake:  Pre-visit preparation completed: Yes  Pain : 0-10 Pain Score: 7  Pain Type: Chronic pain Pain Location: Foot Pain Orientation: Right Pain Descriptors / Indicators: Pressure Pain Onset: More than a month ago Pain Frequency: Constant     Nutritional Risks: None Diabetes: Yes CBG done?: No Did pt. bring in CBG monitor from home?: No  How often do you need to have someone help you when you read instructions, pamphlets, or other written materials from your doctor or pharmacy?: 1 - Never  Interpreter Needed?: No  Information entered by :: NAllen LPN   Activities of Daily Living    09/19/2023   12:48 PM 02/12/2023    2:21 PM  In your present state of health, do you have any difficulty performing the following activities:  Hearing?  0 0  Vision? 0 1  Difficulty concentrating or making decisions? 0 1  Walking or climbing stairs? 0 0  Dressing or bathing? 0 0  Doing errands, shopping? 0 0  Preparing Food and eating ? N   Using the Toilet? N   In the past six months, have you accidently leaked urine? Y   Do you have problems with  loss of bowel control? N   Managing your Medications? Y   Managing your Finances? N   Housekeeping or managing your Housekeeping? Y     Patient Care Team: Early, Sung Amabile, NP as PCP - General (Nurse Practitioner) Harlan Stains, RPH (Inactive) (Pharmacist)  Indicate any recent Medical Services you may have received from other than [facility] providers in the past year (date may be approximate).     Assessment:   This is a routine wellness examination for [place].  Hearing/Vision screen Hearing Screening - Comments:: Denies hearing issues Vision Screening - Comments:: Regular eye exams, [facility]   Goals Addressed             This Visit's Progress    Patient Stated       09/25/2023, denies goals       Depression Screen    09/25/2023   12:10 PM 06/08/2023    2:14 PM 05/19/2021   11:23 AM 04/12/2021   10:40 AM 03/22/2020    2:23 PM 12/31/2019   12:35 PM 04/29/2019    4:11 PM  PHQ 2/9 Scores  PHQ - 2 Score 0 0 0 0 0 0 0  PHQ- 9 Score      0     Fall Risk    09/19/2023   12:48 PM 06/08/2023    2:14 PM 02/15/2023    9:04 AM 05/19/2021   11:23 AM 04/12/2021   10:40 AM  Fall Risk   Falls in the past year? 0 0 0 0 0  Number falls in past yr: 0 0 0  0  Injury with Fall? 0 0 0  0  Risk for fall due to : Medication side effect No Fall Risks  Medication side effect   Follow up Falls prevention discussed;Falls evaluation completed Falls evaluation completed Falls evaluation completed Falls evaluation completed;Education provided;Falls prevention discussed     MEDICARE RISK AT HOME: Medicare Risk at Home Any stairs in or around the home?: Yes If so, are there any without handrails?: No Home free of loose throw rugs in walkways, pet beds, electrical cords, etc?: Yes Adequate lighting in your home to reduce risk of falls?: Yes Life alert?: No Use of a cane, walker or w/c?: No Grab bars in the bathroom?: Yes Shower chair or bench in shower?: Yes Elevated toilet seat or a  handicapped toilet?: Yes  TIMED UP AND GO:  Was the test performed?  No    Cognitive Function:        09/25/2023   12:10 PM 05/19/2021   11:29 AM 12/31/2019   12:38 PM 03/19/2019   11:49 AM  6CIT Screen  What Year? 0 points 0 points 0 points 0 points  What month? 0 points 0 points 3 points 0 points  What time? 0 points 0 points 0 points 0 points  Count back from 20 0 points 0 points 2 points 0 points  Months in reverse 2 points 0 points 4 points 0 points  Repeat phrase 4 points 2 points 2 points 2 points  Total Score 6 points 2 points 11  points 2 points    Immunizations Immunization History  Administered Date(s) Administered   Fluad Quad(high Dose 65+) 09/01/2020, 07/12/2021   Influenza Split 07/25/2012   Influenza, High Dose Seasonal PF 10/08/2018, 10/02/2019   Influenza,inj,Quad PF,6+ Mos 11/21/2013   Influenza-Unspecified 07/25/2012, 11/21/2013   Moderna Covid-19 Vaccine Bivalent Booster 78yrs & up 07/07/2021   Moderna SARS-COV2 Booster Vaccination 10/08/2020, 10/08/2020   Moderna Sars-Covid-2 Vaccination 12/03/2019, 01/01/2020   Pneumococcal Polysaccharide-23 11/21/2013   Pneumococcal-Unspecified 11/21/2013   Td 01/29/1999, 11/21/2013    TDAP status: Up to date  Flu Vaccine status: Due, Education has been provided regarding the importance of this vaccine. Advised may receive this vaccine at local pharmacy or Health Dept. Aware to provide a copy of the vaccination record if obtained from local pharmacy or Health Dept. Verbalized acceptance and understanding.  Pneumococcal vaccine status: Due, Education has been provided regarding the importance of this vaccine. Advised may receive this vaccine at local pharmacy or Health Dept. Aware to provide a copy of the vaccination record if obtained from local pharmacy or Health Dept. Verbalized acceptance and understanding.  Covid-19 vaccine status: Information provided on how to obtain vaccines.   Qualifies for Shingles Vaccine?  Yes   Zostavax completed No   Shingrix Completed?: No.    Education has been provided regarding the importance of this vaccine. Patient has been advised to call insurance company to determine out of pocket expense if they have not yet received this vaccine. Advised may also receive vaccine at local pharmacy or Health Dept. Verbalized acceptance and understanding.  Screening Tests Health Maintenance  Topic Date Due   Zoster Vaccines- Shingrix (1 of 2) Never done   Pneumonia Vaccine 20+ Years old (2 of 2 - PCV) 11/21/2014   OPHTHALMOLOGY EXAM  02/03/2022   Diabetic kidney evaluation - Urine ACR  07/12/2022   INFLUENZA VACCINE  05/31/2023   COVID-19 Vaccine (4 - 2023-24 season) 07/01/2023   DTaP/Tdap/Td (3 - Tdap) 11/22/2023   HEMOGLOBIN A1C  12/09/2023   FOOT EXAM  01/01/2024   Diabetic kidney evaluation - eGFR measurement  09/18/2024   Medicare Annual Wellness (AWV)  09/24/2024   DEXA SCAN  Completed   HPV VACCINES  Aged Out   Colonoscopy  Discontinued   Hepatitis C Screening  Discontinued    Health Maintenance  Health Maintenance Due  Topic Date Due   Zoster Vaccines- Shingrix (1 of 2) Never done   Pneumonia Vaccine 28+ Years old (2 of 2 - PCV) 11/21/2014   OPHTHALMOLOGY EXAM  02/03/2022   Diabetic kidney evaluation - Urine ACR  07/12/2022   INFLUENZA VACCINE  05/31/2023   COVID-19 Vaccine (4 - 2023-24 season) 07/01/2023    Colorectal cancer screening: No longer required.   Mammogram status: Completed 03/19/2023. Repeat every year  Bone Density status: Completed 10/06/2015.   Lung Cancer Screening: (Low Dose CT Chest recommended if Age 32-80 years, 20 pack-year currently smoking OR have quit w/in 15years.) does not qualify.   Lung Cancer Screening Referral: no  Additional Screening:  Hepatitis C Screening: does not qualify;   Vision Screening: Recommended annual ophthalmology exams for early detection of glaucoma and other disorders of the eye. Is the patient up to  date with their annual eye exam?  No  Who is the provider or what is the name of the office in which the patient attends annual eye exams? [facility] If pt is not established with a provider, would they like to be referred to a provider  to establish care? No .   Dental Screening: Recommended annual dental exams for proper oral hygiene  Diabetic Foot Exam: Diabetic Foot Exam: Completed 01/01/2023  Community Resource Referral / Chronic Care Management: CRR required this visit?  No   CCM required this visit?  No     Plan:     I have personally reviewed and noted the following in the patient's chart:   Medical and social history Use of alcohol, tobacco or illicit drugs  Current medications and supplements including opioid prescriptions. Patient is not currently taking opioid prescriptions. Functional ability and status Nutritional status Physical activity Advanced directives List of other physicians Hospitalizations, surgeries, and ER visits in previous 12 months Vitals Screenings to include cognitive, depression, and falls Referrals and appointments  In addition, I have reviewed and discussed with patient certain preventive protocols, quality metrics, and best practice recommendations. A written personalized care plan for preventive services as well as general preventive health recommendations were provided to patient.     Barb Merino, LPN   [id number]   After Visit Summary: (MyChart) Due to this being a telephonic visit, the after visit summary with patients personalized plan was offered to patient via MyChart   Nurse Notes: none

## 2023-09-25 NOTE — Patient Instructions (Signed)
[name] , Thank you for taking time to come for your Medicare Wellness Visit. I appreciate your ongoing commitment to your health goals. Please review the following plan we discussed and let me know if I can assist you in the future.   Referrals/Orders/Follow-Ups/Clinician Recommendations: none  This is a list of the screening recommended for you and due dates:  Health Maintenance  Topic Date Due   Zoster (Shingles) Vaccine (1 of 2) Never done   Pneumonia Vaccine (2 of 2 - PCV) 11/21/2014   Eye exam for diabetics  02/03/2022   Yearly kidney health urinalysis for diabetes  07/12/2022   Flu Shot  05/31/2023   COVID-19 Vaccine (4 - 2023-24 season) 07/01/2023   DTaP/Tdap/Td vaccine (3 - Tdap) 11/22/2023   Hemoglobin A1C  12/09/2023   Complete foot exam   01/01/2024   Yearly kidney function blood test for diabetes  09/18/2024   Medicare Annual Wellness Visit  09/24/2024   DEXA scan (bone density measurement)  Completed   HPV Vaccine  Aged Out   Colon Cancer Screening  Discontinued   Hepatitis C Screening  Discontinued    Advanced directives: (ACP Link)Information on Advanced Care Planning can be found at [facility] [place] Advance Health Care Directives Advance Health Care Directives ([email or web address]   Next Medicare Annual Wellness Visit scheduled for next year: No, will schedule next year  Insert Preventive Care attachment Insert FALL PREVENTION attachment if needed

## 2023-10-01 ENCOUNTER — Ambulatory Visit: Payer: Medicare Other | Admitting: Family Medicine

## 2023-10-04 ENCOUNTER — Encounter: Payer: Self-pay | Admitting: Internal Medicine

## 2023-10-04 ENCOUNTER — Ambulatory Visit (INDEPENDENT_AMBULATORY_CARE_PROVIDER_SITE_OTHER): Payer: Medicare Other | Admitting: Internal Medicine

## 2023-10-04 VITALS — BP 122/60 | HR 77 | Temp 98.2°F | Ht 67.0 in | Wt 240.8 lb

## 2023-10-04 DIAGNOSIS — S99921A Unspecified injury of right foot, initial encounter: Secondary | ICD-10-CM

## 2023-10-04 DIAGNOSIS — I10 Essential (primary) hypertension: Secondary | ICD-10-CM | POA: Diagnosis not present

## 2023-10-04 DIAGNOSIS — R109 Unspecified abdominal pain: Secondary | ICD-10-CM

## 2023-10-04 DIAGNOSIS — E785 Hyperlipidemia, unspecified: Secondary | ICD-10-CM

## 2023-10-04 DIAGNOSIS — R32 Unspecified urinary incontinence: Secondary | ICD-10-CM

## 2023-10-04 DIAGNOSIS — K219 Gastro-esophageal reflux disease without esophagitis: Secondary | ICD-10-CM

## 2023-10-04 DIAGNOSIS — E1169 Type 2 diabetes mellitus with other specified complication: Secondary | ICD-10-CM | POA: Diagnosis not present

## 2023-10-04 DIAGNOSIS — Z7984 Long term (current) use of oral hypoglycemic drugs: Secondary | ICD-10-CM

## 2023-10-04 MED ORDER — SOLIFENACIN SUCCINATE 5 MG PO TABS
5.0000 mg | ORAL_TABLET | Freq: Every day | ORAL | 1 refills | Status: AC
Start: 2023-10-04 — End: ?

## 2023-10-04 NOTE — Progress Notes (Signed)
[facility]IMARY [facility]ARE [facility] [address] [place] [address] Dept: [phone] Dept Fax: [phone]  New Patient Office Visit  Subjective:   [name] Oct 17, 1941 10/04/2023  Chief Complaint  Patient presents with   Establish Care    Discuss hospital follow up    HPI: [name] presents today to establish care at [facility] at [facility]. Introduced to Publishing rights manager role and practice setting.  All questions answered.  Concerns: See below   Discussed the use of AI scribe software for clinical note transcription with the patient, who gave verbal consent to proceed.  History of Present Illness   The patient, with a past medical history of diabetes, high cholesterol, high blood pressure, and gallstones, presents for establishing care. They report their diabetes is well-controlled with their blood sugar levels staying around 115, and they are not currently on any medication for it. They are taking rosuvastatin 40mg  once a day for their high cholesterol and a combination medication of olmesartan, amlodipine, and hydrochlorothiazide for their high blood pressure.  The patient had a recent ER visit due to chest pain and shortness of breath, which was attributed to gallstones. They also report occasional chest pain at night time, only occuring when laying down, which they believe is related to their diet and possibly acid reflux. They have been prescribed pantoprazole for this, which they take as needed but have not been taking it lately.   The patient also reports a history of urinary incontinence and nocturia, which they attribute to a surgical complication from a previous colon surgery where their ureter was nicked. They report needing to wear pads during the day and waking up hourly at night to urinate. They have previously seen a urologist for this issue and were prescribed VESIcare, which they stopped taking it due  to confusion on which medications to take.   The patient also reports right foot pain due to a recent injury where they dropped something on their foot. They report the foot pain is constant and is having to wear loose fitting shoes due to pain.       The following portions of the patient's history were reviewed and updated as appropriate: past medical history, past surgical history, family history, social history, allergies, medications, and problem list.   Patient Active Problem List   Diagnosis Date Noted   Hypoglycemia 06/23/2023   History of TIA (transient ischemic attack) 02/12/2023   OSA on CPAP 10/19/2022   Expressive aphasia    Chronic kidney disease, stage 3a (HCC) 07/17/2021   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   Aortic atherosclerosis (HCC) 01/03/2021   Class 2 severe obesity due to excess calories with serious comorbidity and body mass index (BMI) of 39.0 to 39.9 in adult [facility]) 01/03/2021   Vitamin D deficiency 06/01/2020   History of arthroplasty of right knee 05/11/2020   Constipation 06/26/2018   Type 2 diabetes mellitus with other specified complication (HCC) 06/19/2014   S/P urological surgery 09/30/2013   OA (osteoarthritis) of knee 01/05/2012   LBBB (left bundle branch block) 07/26/2010   Gastroesophageal reflux disease without esophagitis 10/06/2008   History of colonic polyps 08/26/2008   Hyperlipidemia associated with type 2 diabetes mellitus (HCC) 06/03/2007   Essential hypertension 06/03/2007   Past Medical History:  Diagnosis Date   Abdominal bloating 02/17/2019   Abdominal pain    AKI (acute kidney injury) (HCC) 02/12/2023   Arthritis    cervical disc degeneration/ oa left knee, carpal  tunnel rt wrist, adhesive capsulitis right shoulder, rt hand weakness; lumbar degeneration   Carpal tunnel syndrome    CARPAL TUNNEL SYNDROME, RIGHT 10/14/2010   Qualifier: Diagnosis of   By: Rodena Medin MD, Acie Fredrickson        CHEST PAIN, ATYPICAL 07/01/2010   Qualifier:  Diagnosis of   By: Abner Greenspan MD, Elio Forget acquired pneumonia 05/06/2020   Complication of anesthesia 2006-at [facility]   breathing problems-no BP med given prior to surgery;  hx of being very sleepy after colon surgery --  states no problems with last right total knee replacement 2013   Constipation    Diabetes mellitus without complication (HCC)    borderline - diet control   Diverticulitis hx of   Diverticulosis    E. coli infection    2020   Frequent UTI    hx of urethral injury during colon surgery - states frequent uti's since   GERD (gastroesophageal reflux disease)    Gout, unspecified 09/09/2007   Qualifier: Diagnosis of   By: Lovell Sheehan MD, Balinda Quails        H/O hiatal hernia    HIP PAIN, LEFT, CHRONIC 11/09/2010   Qualifier: Diagnosis of   By: Lovell Sheehan MD, Balinda Quails        History of palpitations    in the past   History of shingles    has a lingering itching on back where shingles were   Hyperlipidemia    Hypertension    Hypokalemia 10/19/2022   KNEE PAIN, RIGHT, CHRONIC 10/14/2010   Qualifier: Diagnosis of   By: Rodena Medin MD, Acie Fredrickson        Lightheaded 02/17/2019   Morbidly obese (HCC) 05/31/2011   bmi of 45     Numbness and tingling in right hand    pt. 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   Shortness of breath 09/14/2021   Shortness of breath 09/14/2021   Spasm of lumbar paraspinous muscle 06/17/2011   Stroke (HCC)    Stroke-like symptom    Subacute intracranial hemorrhage (HCC) 10/18/2022   Transient speech disturbance 04/18/2022   Urinary frequency 02/17/2019   Vaginal discharge 08/02/2011   Vitamin D deficiency    Past Surgical History:  Procedure Laterality  Date   ABDOMINAL HYSTERECTOMY     bladder tack     BREAST BIOPSY Left    BREAST SURGERY     breast duct resection- benign   CARPAL TUNNEL RELEASE Right 06/04/2014   Procedure: RIGHT CARPAL TUNNEL RELEASE;  Surgeon: Dominica Severin, MD;  Location: [facility];  Service: Orthopedics;  Laterality: Right;   COLON RESECTION  2008   hx diverticulosis   COLON SURGERY     COLONOSCOPY     colonoscopy [id number]     JOINT REPLACEMENT     OOPHORECTOMY     POLYPECTOMY     skin graft left arm - traumatic compression injury left upper arm     temporary ureter stent     TOTAL KNEE ARTHROPLASTY  01/05/2012   Procedure: TOTAL KNEE ARTHROPLASTY;  Surgeon: Loanne Drilling, MD;  Location: [facility];  Service: Orthopedics;  Laterality: Right;   TOTAL KNEE ARTHROPLASTY Left 08/17/2014   Procedure: LEFT TOTAL KNEE ARTHROPLASTY;  Surgeon: Loanne Drilling, MD;  Location: [facility];  Service: Orthopedics;  Laterality: Left;   ureter repair for tyransected left ureter     Family History  Problem Relation Age of Onset   Breast cancer Mother 74   Hypertension Mother    Prostate cancer Father    Hypertension Father    Dementia Sister    Lung cancer Brother    Hypertension Brother    COPD Brother    Cancer Maternal Grandmother    Arthritis Sister    Hypertension Sister    Arthritis Sister    Hypertension Child    Hypertension Child    Hypertension Child    Colon polyps Neg Hx    Esophageal cancer Neg Hx    Rectal cancer Neg Hx    Stomach cancer Neg Hx    Colon cancer Neg Hx     Current Outpatient Medications:    aspirin EC 81 MG tablet, Take 81 mg by mouth daily. Swallow whole., Disp: , Rfl:    Blood Pressure KIT, 1 Units by Does not apply route daily. Check blood pressure daily for hypertension dx code I10, Disp: 1 kit, Rfl: 0   estradiol (ESTRACE) 0.1 MG/GM vaginal cream, Place 1 Applicatorful vaginally 3 (three) times a week., Disp: , Rfl:    Lancets (ONETOUCH DELICA PLUS LANCET33G)  MISC, USE TO CHECK BLOOD SUGAR TWICE DAILY, Disp: 100 each, Rfl: 3   Multiple Vitamins-Minerals (WOMENS 50+ MULTI VITAMIN) TABS, Take 1 tablet by mouth daily., Disp: , Rfl:    Olmesartan-amLODIPine-HCTZ 40-10-12.5 MG TABS, Take 1 tablet by mouth daily., Disp: , Rfl:    ONETOUCH VERIO test strip, USE TO TEST 2 TIMES DAILY, Disp: 100 strip, Rfl: 3   pantoprazole (PROTONIX) 40 MG tablet, Take 40 mg by mouth daily., Disp: , Rfl:    rosuvastatin (CRESTOR) 40 MG tablet, Take 1 tablet (40 mg total) by mouth daily., Disp: 90 tablet, Rfl: 0   solifenacin (VESICARE) 5 MG tablet, Take 1 tablet (5 mg total) by mouth daily., Disp: 90 tablet, Rfl: 1   cyanocobalamin (VITAMIN B12) 1000 MCG tablet, Take 3,000 mcg by mouth daily., Disp: , Rfl:    hydrALAZINE (APRESOLINE) 10 MG tablet, Take 10 mg by mouth as needed., Disp: , Rfl:    magnesium 30 MG tablet, Take 400 mg by mouth daily., Disp: , Rfl:  Allergies  Allergen Reactions   Bee Venom Hives   Latex Hives and Itching   Codeine Other (See Comments)    Hallucinations    Hydrocodone Other (See Comments)    hallucinations   Oxycodone Other (See Comments)    hallucinations    ROS: A complete ROS was performed with pertinent positives/negatives noted in the HPI. The remainder of the ROS are negative.   Objective:   Today's Vitals   10/04/23 1436  BP: 122/60  Pulse: 77  Temp: 98.2 F (36.8 C)  TempSrc: Temporal  SpO2: 96%  Weight: 240 lb 12.8 oz (109.2 kg)  Height: 5\' 7"  (1.702 m)    GENERAL: Well-appearing, in NAD. Well nourished.  SKIN: Pink, warm and dry. No rash, lesion, ulceration, or ecchymoses.  NECK: Trachea midline. Full ROM w/o pain or tenderness. No lymphadenopathy.  RESPIRATORY: Chest wall symmetrical. Respirations even and non-labored. Breath sounds clear to auscultation bilaterally.  CARDIAC: S1, S2 present, regular rate and rhythm. Peripheral pulses  2+ bilaterally.  MSK: Muscle tone and strength appropriate for age.   EXTREMITIES: Without clubbing, cyanosis, or edema.  NEUROLOGIC: Steady, even gait.  PSYCH/MENTAL STATUS: Alert, oriented x 3. Cooperative, appropriate mood and affect.   Health Maintenance Due  Topic Date Due   Zoster Vaccines- Shingrix (1 of 2) Never done   Pneumonia Vaccine 11+ Years old (2 of 2 - PCV) 11/21/2014   OPHTHALMOLOGY EXAM  02/03/2022   Diabetic kidney evaluation - Urine ACR  07/12/2022   INFLUENZA VACCINE  05/31/2023    No results found for any visits on 10/04/23.  Assessment & Plan:  Assessment and Plan    Foot Pain Chronic pain in the foot after dropping an object on it 2-3 months ago. Pain is constant and exacerbated by certain shoes. -Order foot X-ray to rule out fracture or other injury.  Urinary Incontinence History of urinary incontinence for 20 years, exacerbated by lying down at night. History of ureter surgery and colon resection. Previously seen by a urologist and was on VESIcare, but stopped due to concerns about kidney function. -Consider restarting VESIcare to manage symptoms. -Consider referral to urologist for further evaluation and management.  Gastroesophageal Reflux Disease (GERD) Reports chest pain at night, especially when lying down. Previously diagnosed with GERD and prescribed Protonix as needed. -Continue Protonix as needed for symptoms.  Hyperlipidemia On rosuvastatin 40mg  at night for cholesterol management. -Continue rosuvastatin 40mg  at night.  Hypertension On olmesartan-amlodipine-HCTZ for blood pressure control. -Continue olmesartatin-amlodipine-HCTZ.  Diabetes Mellitus Reports blood glucose levels around 115. Previously on glipizide, but not currently taking any diabetes medications. -Check HbA1c today to assess glycemic control.  General Health Maintenance -Follow-up in 3 months.       Orders Placed This Encounter  Procedures   DG Foot Complete Right    Standing Status:   Future    Standing Expiration Date:    10/03/2024    Order Specific Question:   Preferred imaging location?    Answer:   [facility]    Order Specific Question:   Reason for exam:    Answer:   foot pain    Order Specific Question:   Release to patient    Answer:   Immediate   Flu Vaccine Trivalent High Dose (Fluad)   TSH    Standing Status:   Future    Standing Expiration Date:   10/03/2024    Order Specific Question:   Release to patient    Answer:   Immediate [1]   Hemoglobin A1C    Standing Status:   Future    Standing Expiration Date:   10/03/2024    Order Specific Question:   Release to patient    Answer:   Immediate [1]   Meds ordered this encounter  Medications   solifenacin (VESICARE) 5 MG tablet    Sig: Take 1 tablet (5 mg total) by mouth daily.    Dispense:  90 tablet    Refill:  1    Order Specific Question:   Supervising Provider    Answer:   Garnette Gunner [[id number]]    Return in about 3 months (around 01/02/2024) for Chronic Condition follow up.   Salvatore Decent, FNP

## 2023-10-04 NOTE — Patient Instructions (Addendum)
Start a once daily Vitamin D3 1000iu supplement over the counter    Go for xray of your foot, no appointment needed. Can just walk in at any time

## 2023-10-08 ENCOUNTER — Ambulatory Visit: Payer: Medicare Other | Admitting: Nurse Practitioner

## 2023-10-17 NOTE — Progress Notes (Signed)
NEUROLOGY FOLLOW UP OFFICE NOTE  [name] [id number]  Assessment/Plan:   Transient ischemic attack Right occipital parenchymal hemorrhage - hypertensive (chronic microhemorrhages appear to be hypertensive in etiology) Hypertension Type 2 diabetes mellitus Hyperlipidemia    Secondary stroke prevention: ASA 81mg  daily Normotensive blood pressure Rosuvastatin 40mg  daily.  LDL goal less than 70.  Recheck lipid panel Glycemic control.  Hgb A1c goal less than 70 Mediterranean diet.   Follow up 6 months     Subjective:  [name] is an 82 year old female with HTN, DM II, CKD and history of TIA who follows up for recent hospital admission.  History supplemented by her accompanying husband and hospital records.    UPDATE: Current medications:  ASA 81mg , Plavix 75mg , hydralazine, olmesartan-amlodipine-HCTZ, glipizide, rosuvastatin 40mg    10/19/2023 LABS:  Hgb A1c 7.5    HISTORY: On 10/13/2022, she began experiencing acute onset of new headaches, described as severe paroxysmal right occipital-temporal stabbing headaches lasting a few seconds and occurring about every 10 minutes.  No associated nausea, vomiting, new visual disturbance, or unilateral numbness or weakness.  She took her blood pressure which was in the 200s systolic.  She went to the ED on 12/18 but left before being seen.  Headaches continued, so she returned to a different ED at [place] on 12/20 where CT head revealed a 1.3 cm hyperdense focus within the right occipital lobe suspicious for hemorrhage.  CTA of head revealed no LVO or other vascular abnormality.  CTV of head revealed no cerebral venous sinus thrombosis.  She was transferred to [facility] for continued workup and management.  MRI of brain revealed early subacute hemorrage within the right occipital lobe.  She was already on ASA 81mg , which was put on hold.  LDL 98.  Hgb A1c 6.4.  She was admitted and monitored.  Repeat CT Head on 12/22  revealed hemorrhage was unchanged.  She was discharged in stable condition.  She is still not on ASA. Reports that she has trouble seeing, worse since the hospital but ongoing prior to the hospitalization.  She followed up with her eye doctor and exam is stable.    She began having a recurrence of headache in 2024.  On 02/11/2023, she started experiencing intermittent left arm and facial numbness.  She was admitted to [facility] where labs revealed AKI with BUN 37 and Cr 1.70.  CT head revealed no acute bleed or ischemic stroke.  MRI of brain revealed chronic small vessel ischemic changes and multiple scattered chronic microhemorrhages but no acute stroke.  MRA of head revealed no LVO or hemodynamically significant stenosis, aneurysm or other abnormality.  Carotid Doppler revealed no hemodynamically significant stenosis.  LDL was 198 and Hgb A1c 6.1.  She apparently was not taking the statin.  She was discharged on DAPT for 3 weeks and started on rosuvastatin 40mg .   10/18/2022 CT HEAD WO:  1.3 cm hyperdense focus within the right occipital lobe. This may reflect an acute parenchymal hemorrhage. However, a brain MRI without and with contrast is recommended to exclude a primary mass or metastatic lesion.  Moderate chronic small ischemic changes within the cerebral white matter. 10/18/2022 CTA/CTV HEAD:  1. No intracranial large vessel occlusion. No evidence of vascular malformation.  2. 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  3. Moderate stenosis in the right mid V4 segment.  4. No evidence of venous sinus thrombosis or stenosis. The  right transverse and sigmoid sinus are hypoplastic, which appears similar to the 04/18/2022 CTA head. 10/18/2022 MRI BRAIN W WO:  1. Early subacute hemorrhage in the right occipital lobe, with a volume of approximately 1 mL. Surrounding edema without significant mass effect or midline shift. No  evidence of active hemorrhage.  2. Multiple foci of hemosiderin deposition, most prominently in the parietal and occipital lobes; with the exception of the new subacute hemorrhage, the others appear unchanged compared to 04/17/2022.  While these could be the sequela of hypertensive microhemorrhages, early cerebral amyloid angiopathy or multiple cavernomas could appear similar. 10/20/2022 CT HEAD WO:  Unchanged hemorrhage in the right occipital lobe, further characterized on recent MRI.  02/12/2023 MRI BRAIN WO:  1. No acute intracranial infarct or other abnormality. 2. 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 3. Multiple scattered chronic micro hemorrhages, most pronounced about the parieto-occipital regions bilaterally. Findings are favored to be hypertensive in nature. 02/12/2023 MRA HEAD:  1. Negative intracranial MRA for large vessel occlusion. 2. Mild intracranial atheromatous disease, but no hemodynamically significant or correctable stenosis. 02/12/2023 CAROTID US:  Right Carotid: Velocities in the right ICA are consistent with a 1-39% stenosis.  Left Carotid: Velocities in the left ICA are consistent with a 1-39% stenosis.  Vertebrals: Bilateral vertebral arteries demonstrate antegrade flow.  Subclavians: Normal flow hemodynamics were seen in bilateral subclavian    PAST MEDICAL HISTORY: Past Medical History:  Diagnosis Date   Abdominal bloating 02/17/2019   Abdominal pain    AKI (acute kidney injury) (HCC) 02/12/2023   Arthritis    cervical disc degeneration/ oa left knee, carpal tunnel rt wrist, adhesive capsulitis right shoulder, rt hand weakness; lumbar degeneration   Carpal tunnel syndrome    CARPAL TUNNEL SYNDROME, RIGHT 10/14/2010   Qualifier: Diagnosis of   By: Rodena Medin MD, Acie Fredrickson        CHEST PAIN, ATYPICAL 07/01/2010   Qualifier: Diagnosis of   By: Abner Greenspan MD, Elio Forget acquired pneumonia 05/06/2020   Complication of anesthesia 2006-at  [facility]   breathing problems-no BP med given prior to surgery;  hx of being very sleepy after colon surgery --  states no problems with last right total knee replacement 2013   Constipation    Diabetes mellitus without complication (HCC)    borderline - diet control   Diverticulitis hx of   Diverticulosis    E. coli infection    2020   Frequent UTI    hx of urethral injury during colon surgery - states frequent uti's since   GERD (gastroesophageal reflux disease)    Gout, unspecified 09/09/2007   Qualifier: Diagnosis of   By: Lovell Sheehan MD, Balinda Quails        H/O hiatal hernia    HIP PAIN, LEFT, CHRONIC 11/09/2010   Qualifier: Diagnosis of   By: Lovell Sheehan MD, Balinda Quails        History of palpitations    in the past   History of shingles    has a lingering itching on back where shingles were   Hyperlipidemia    Hypertension    Hypokalemia 10/19/2022   KNEE PAIN, RIGHT, CHRONIC 10/14/2010   Qualifier: Diagnosis of   By: Rodena Medin MD, Acie Fredrickson        Lightheaded 02/17/2019   Morbidly obese (HCC) 05/31/2011   bmi of 44     Numbness and tingling in right hand    pt. 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 Swallow whole.      Blood Pressure KIT 1 Units by Does not apply route daily. Check blood pressure daily for hypertension dx code I10 1 kit 0   cyanocobalamin (VITAMIN B12) 1000 MCG tablet Take 3,000 mcg by mouth daily.     estradiol (ESTRACE) 0.1 MG/GM vaginal cream Place 1 Applicatorful vaginally 3 (three) times a week.     hydrALAZINE (APRESOLINE) 10 MG tablet Take 10 mg by mouth as needed.     Lancets (ONETOUCH DELICA PLUS LANCET33G) MISC USE TO CHECK BLOOD SUGAR TWICE DAILY 100 each 3   magnesium 30 MG tablet Take 400 mg by mouth daily.     Multiple Vitamins-Minerals (WOMENS 50+ MULTI VITAMIN) TABS Take 1 tablet by mouth daily.     Olmesartan-amLODIPine-HCTZ 40-10-12.5 MG TABS Take 1 tablet by mouth daily.     ONETOUCH VERIO test strip USE TO TEST 2 TIMES DAILY 100 strip 3   pantoprazole (PROTONIX) 40 MG tablet Take 40 mg by mouth daily.     rosuvastatin (CRESTOR) 40 MG tablet Take 1 tablet (40 mg total) by mouth daily. 90 tablet 0   solifenacin (VESICARE) 5 MG tablet Take 1 tablet (5 mg total) by mouth daily. 90 tablet 1   No current facility-administered medications on file prior to visit.    ALLERGIES: Allergies  Allergen Reactions   Bee Venom Hives   Latex Hives and Itching   Codeine Other (See Comments)    Hallucinations    Hydrocodone Other (See Comments)    hallucinations   Oxycodone Other (See Comments)    hallucinations    FAMILY HISTORY: Family History  Problem Relation Age of Onset   Breast cancer Mother 15   Hypertension Mother    Prostate cancer Father    Hypertension Father    Dementia Sister    Lung cancer Brother    Hypertension Brother    COPD Brother    Cancer Maternal Grandmother    Arthritis Sister    Hypertension Sister    Arthritis Sister    Hypertension Child    Hypertension Child    Hypertension Child    Colon polyps Neg Hx    Esophageal cancer Neg Hx    Rectal cancer Neg Hx    Stomach cancer Neg Hx    Colon cancer Neg Hx       Objective:  Blood  pressure 138/72, pulse 76, height 5\' 6"  (1.676 m), weight 241 lb (109.3 kg), SpO2 98%. General: No acute distress.  Patient appears well-groomed.   Head:  Normocephalic/atraumatic Eyes:  Fundi examined but not visualized Neck: supple, no paraspinal  tenderness, full range of motion Heart:  Regular rate and rhythm Neurological Exam: alert and oriented.  Speech fluent and not dysarthric, language intact.  CN II-XII intact. Bulk and tone normal, muscle strength 5/5 throughout.  Sensation to light touch intact.  Deep tendon reflexes 2+ throughout.  Finger to nose testing intact.  Gait normal, Romberg negative.   Shon Millet, DO  CC: Salvatore Decent, FNP

## 2023-10-19 ENCOUNTER — Other Ambulatory Visit (INDEPENDENT_AMBULATORY_CARE_PROVIDER_SITE_OTHER): Payer: Medicare Other

## 2023-10-19 DIAGNOSIS — E1169 Type 2 diabetes mellitus with other specified complication: Secondary | ICD-10-CM | POA: Diagnosis not present

## 2023-10-19 DIAGNOSIS — I10 Essential (primary) hypertension: Secondary | ICD-10-CM

## 2023-10-19 LAB — TSH: TSH: 2.53 u[IU]/mL (ref 0.35–5.50)

## 2023-10-19 LAB — HEMOGLOBIN A1C: Hgb A1c MFr Bld: 7.5 % — ABNORMAL HIGH (ref 4.6–6.5)

## 2023-10-21 ENCOUNTER — Other Ambulatory Visit: Payer: Self-pay

## 2023-10-21 ENCOUNTER — Emergency Department (HOSPITAL_BASED_OUTPATIENT_CLINIC_OR_DEPARTMENT_OTHER): Payer: Medicare Other

## 2023-10-21 ENCOUNTER — Emergency Department (HOSPITAL_BASED_OUTPATIENT_CLINIC_OR_DEPARTMENT_OTHER)
Admission: EM | Admit: 2023-10-21 | Discharge: 2023-10-21 | Disposition: A | Discharge status: Home / Self Care | Payer: Medicare Other | Attending: Emergency Medicine | Admitting: Emergency Medicine

## 2023-10-21 ENCOUNTER — Encounter (HOSPITAL_BASED_OUTPATIENT_CLINIC_OR_DEPARTMENT_OTHER): Payer: Self-pay | Admitting: Emergency Medicine

## 2023-10-21 DIAGNOSIS — J209 Acute bronchitis, unspecified: Secondary | ICD-10-CM | POA: Diagnosis not present

## 2023-10-21 DIAGNOSIS — I10 Essential (primary) hypertension: Secondary | ICD-10-CM | POA: Diagnosis not present

## 2023-10-21 DIAGNOSIS — J069 Acute upper respiratory infection, unspecified: Secondary | ICD-10-CM | POA: Insufficient documentation

## 2023-10-21 DIAGNOSIS — R059 Cough, unspecified: Secondary | ICD-10-CM | POA: Diagnosis present

## 2023-10-21 DIAGNOSIS — Z7982 Long term (current) use of aspirin: Secondary | ICD-10-CM | POA: Diagnosis not present

## 2023-10-21 DIAGNOSIS — Z1152 Encounter for screening for COVID-19: Secondary | ICD-10-CM | POA: Insufficient documentation

## 2023-10-21 DIAGNOSIS — Z91040 Latex allergy status: Secondary | ICD-10-CM | POA: Diagnosis not present

## 2023-10-21 DIAGNOSIS — Z79899 Other long term (current) drug therapy: Secondary | ICD-10-CM | POA: Insufficient documentation

## 2023-10-21 LAB — RESP PANEL BY RT-PCR (RSV, FLU A&B, COVID)  RVPGX2
Influenza A by PCR: NEGATIVE
Influenza B by PCR: NEGATIVE
Resp Syncytial Virus by PCR: NEGATIVE
SARS Coronavirus 2 by RT PCR: NEGATIVE

## 2023-10-21 MED ORDER — DEXAMETHASONE 4 MG PO TABS
10.0000 mg | ORAL_TABLET | Freq: Once | ORAL | Status: AC
Start: 2023-10-21 — End: 2023-10-21
  Administered 2023-10-21: 10 mg via ORAL
  Filled 2023-10-21: qty 3

## 2023-10-21 MED ORDER — AZITHROMYCIN 250 MG PO TABS: 250.0000 mg | ORAL_TABLET | Freq: Every day | ORAL | 0 refills | Status: DC

## 2023-10-21 NOTE — ED Triage Notes (Signed)
4 days headache.cough ,no fevers. When she lies down pt has coughing spell (wet sounding ), is coughing up thick clear. Home covid negative.

## 2023-10-21 NOTE — ED Notes (Signed)
Pt alert and oriented X 4 at the time of discharge. RR even and unlabored. No acute distress noted. Pt verbalized understanding of discharge instructions as discussed. Pt ambulatory to lobby at time of discharge.

## 2023-10-21 NOTE — ED Provider Notes (Signed)
[place] EMERGENCY DEPARTMENT AT [facility] Provider Note   CSN: [id number] Arrival date & time: 10/21/23  0901     History  Chief Complaint  Patient presents with   URI    [name] is a 82 y.o. female.  Patient here with cough and sputum production for the last few days.  Body aches has any chest pain shortness of breath.  She has had some good sputum production.  She has a history of high cholesterol, hypertension, reflux.  Nothing makes it worse or better.  No obvious sick contacts but may be at church.  She denies any weakness numbness tingling.  No nausea vomiting diarrhea.  The history is provided by the patient.       Home Medications Prior to Admission medications   Medication Sig Start Date End Date Taking? Authorizing Provider  azithromycin (ZITHROMAX) 250 MG tablet Take 1 tablet (250 mg total) by mouth daily. Take first 2 tablets together, then 1 every day until finished. 10/21/23  Yes Chrishaun Sasso, DO  aspirin EC 81 MG tablet Take 81 mg by mouth daily. Swallow whole.    [provider]  Blood Pressure KIT 1 Units by Does not apply route daily. Check blood pressure daily for hypertension dx code I10 09/14/21   Chilton Si, MD  cyanocobalamin (VITAMIN B12) 1000 MCG tablet Take 3,000 mcg by mouth daily.    [provider]  estradiol (ESTRACE) 0.1 MG/GM vaginal cream Place 1 Applicatorful vaginally 3 (three) times a week. 09/14/22   [provider]  hydrALAZINE (APRESOLINE) 10 MG tablet Take 10 mg by mouth as needed.    [provider]  Lancets (ONETOUCH DELICA PLUS LANCET33G) MISC USE TO CHECK BLOOD SUGAR TWICE DAILY 12/24/20   Dorothyann Peng, MD  magnesium 30 MG tablet Take 400 mg by mouth daily.    [provider]  Multiple Vitamins-Minerals (WOMENS 50+ MULTI VITAMIN) TABS Take 1 tablet by mouth daily. 08/24/21   [provider]  Olmesartan-amLODIPine-HCTZ 40-10-12.5 MG TABS Take 1 tablet  by mouth daily. 07/05/23   [provider]  Letta Pate VERIO test strip USE TO TEST 2 TIMES DAILY 12/20/20   Dorothyann Peng, MD  pantoprazole (PROTONIX) 40 MG tablet Take 40 mg by mouth daily. 06/02/23   [provider]  rosuvastatin (CRESTOR) 40 MG tablet Take 1 tablet (40 mg total) by mouth daily. 02/13/23   Arnetha Courser, MD  solifenacin (VESICARE) 5 MG tablet Take 1 tablet (5 mg total) by mouth daily. 10/04/23   Salvatore Decent, FNP      Allergies    Bee venom, Latex, Codeine, Hydrocodone, and Oxycodone    Review of Systems   Review of Systems  Physical Exam Updated Vital Signs BP (!) 151/72   Pulse 69   Temp 98.5 F (36.9 C) (Oral)   Resp 17   Wt 103.9 kg   SpO2 96%   BMI 35.87 kg/m  Physical Exam Vitals and nursing note reviewed.  Constitutional:      General: She is not in acute distress.    Appearance: She is well-developed. She is not ill-appearing.  HENT:     Head: Normocephalic and atraumatic.     Nose: Nose normal.     Mouth/Throat:     Mouth: Mucous membranes are moist.  Eyes:     Extraocular Movements: Extraocular movements intact.     Conjunctiva/sclera: Conjunctivae normal.     Pupils: Pupils are equal, round, and reactive to light.  Cardiovascular:     Rate and Rhythm: Normal rate and regular rhythm.     Pulses: Normal pulses.     Heart sounds: Normal heart sounds. No murmur heard. Pulmonary:     Effort: Pulmonary effort is normal. No respiratory distress.     Breath sounds: Normal breath sounds.  Abdominal:     Palpations: Abdomen is soft.     Tenderness: There is no abdominal tenderness.  Musculoskeletal:        General: No swelling.     Cervical back: Normal range of motion and neck supple.  Skin:    General: Skin is warm and dry.     Capillary Refill: Capillary refill takes less than 2 seconds.  Neurological:     General: No focal deficit present.     Mental Status: She is alert.  Psychiatric:        Mood and Affect: Mood normal.      ED Results / Procedures / Treatments   Labs (all labs ordered are listed, but only abnormal results are displayed) Labs Reviewed  RESP PANEL BY RT-PCR (RSV, FLU A&B, COVID)  RVPGX2    EKG None  Radiology DG Chest Portable 1 View Result Date: 10/21/2023 CLINICAL DATA:  cough EXAM: PORTABLE CHEST 1 VIEW COMPARISON:  09/19/2023 chest radiograph. FINDINGS: Chronic moderate elevation of the right hemidiaphragm. Stable cardiomediastinal silhouette with top-normal heart size. No pneumothorax. No pleural effusion. Mild right lung base passive atelectasis. No pulmonary edema. No acute consolidative airspace disease. IMPRESSION: Chronic moderate elevation of the right hemidiaphragm with mild right lung base passive atelectasis. No acute cardiopulmonary disease. Electronically Signed   By: Delbert Phenix M.D.   On: 10/21/2023 10:03    Procedures Procedures    Medications Ordered in ED Medications  dexamethasone (DECADRON) tablet 10 mg (10 mg Oral Given 10/21/23 1040)    ED Course/ Medical Decision Making/ A&P                                 Medical Decision Making Amount and/or Complexity of Data Reviewed Radiology: ordered.  Risk Prescription drug management.   WITNEY ROTI is here with cough.  Normal vitals.  No fever.  Cough and sputum production the last couple days.  She appears well.  No fever hypertension shortness of breath.  She is very well-appearing.  She has a harsh cough on exam.  Differential diagnosis likely viral process versus pneumonia/bronchitis.  Will give Decadron get chest x-ray and get viral testing.  I have no concern for ACS PE or other acute process.  She is not having any chest pain shortness of breath or other symptoms otherwise.  Per my review and interpretation of x-rays no obvious pneumonia.  The radiology report with the same.  COVID and flu test negative as well as RSV test.  Overall we will treat with Decadron antibiotics for bronchitis.  Will  have her follow [facility] care doctor.  She was told to return if she develops worsening symptoms.  This chart was dictated using voice recognition software.  Despite best efforts to proofread,  errors can occur which can change the documentation meaning.         Final Clinical Impression(s) / ED Diagnoses Final diagnoses:  Upper respiratory tract infection, unspecified type  Acute bronchitis, unspecified organism    Rx / DC Orders ED Discharge Orders          Ordered  azithromycin (ZITHROMAX) 250 MG tablet  Daily        10/21/23 1051              [place], DO 10/21/23 1053

## 2023-10-22 ENCOUNTER — Other Ambulatory Visit: Payer: Medicare Other

## 2023-10-22 ENCOUNTER — Other Ambulatory Visit: Payer: Self-pay | Admitting: Internal Medicine

## 2023-10-22 ENCOUNTER — Ambulatory Visit: Payer: Medicare Other | Admitting: Neurology

## 2023-10-22 ENCOUNTER — Telehealth: Payer: Self-pay

## 2023-10-22 ENCOUNTER — Encounter: Payer: Self-pay | Admitting: Neurology

## 2023-10-22 ENCOUNTER — Encounter: Payer: Self-pay | Admitting: Internal Medicine

## 2023-10-22 VITALS — BP 138/72 | HR 76 | Ht 66.0 in | Wt 241.0 lb

## 2023-10-22 DIAGNOSIS — I10 Essential (primary) hypertension: Secondary | ICD-10-CM | POA: Diagnosis not present

## 2023-10-22 DIAGNOSIS — E1122 Type 2 diabetes mellitus with diabetic chronic kidney disease: Secondary | ICD-10-CM

## 2023-10-22 DIAGNOSIS — N1831 Chronic kidney disease, stage 3a: Secondary | ICD-10-CM

## 2023-10-22 DIAGNOSIS — E785 Hyperlipidemia, unspecified: Secondary | ICD-10-CM | POA: Diagnosis not present

## 2023-10-22 DIAGNOSIS — G4733 Obstructive sleep apnea (adult) (pediatric): Secondary | ICD-10-CM

## 2023-10-22 DIAGNOSIS — G459 Transient cerebral ischemic attack, unspecified: Secondary | ICD-10-CM | POA: Diagnosis not present

## 2023-10-22 MED ORDER — DAPAGLIFLOZIN PROPANEDIOL 5 MG PO TABS: 5.0000 mg | ORAL_TABLET | Freq: Every day | ORAL | 3 refills | Status: DC

## 2023-10-22 NOTE — Patient Instructions (Addendum)
Check lipid panel Continue aspirin, rosuvastatin, blood pressure and diabetes medications Follow up with your PCP about the diabetes Follow up 6 months.

## 2023-10-22 NOTE — Transitions of Care (Post Inpatient/ED Visit) (Signed)
10/22/2023  Name: [name] MRN: [id number] DOB: [birth year]  Today's TOC FU Call Status: Today's TOC FU Call Status:: Successful TOC FU Call Completed TOC FU Call Complete Date: 10/22/23 Patient's Name and Date of Birth confirmed.  Transition Care Management Follow-up Telephone Call Date of Discharge: 10/21/23 Discharge Facility: [facility] ([facility]) Type of Discharge: Emergency Department Reason for ED Visit: Cardiac Conditions (URI) How have you been since you were released from the hospital?: Better Any questions or concerns?: No  Items Reviewed: Did you receive and understand the discharge instructions provided?: Yes Medications obtained,verified, and reconciled?: Yes (Medications Reviewed) Any new allergies since your discharge?: No Dietary orders reviewed?: No Do you have support at home?: Yes People in Home: child(ren), dependent Name of Support/Comfort Primary Source: daugther  Medications Reviewed Today: Medications Reviewed Today     Reviewed by Leroy Kennedy, CMA (Certified Medical Assistant) on 10/22/23 at 1335  Med List Status: <None>   Medication Order Taking? Sig Documenting Provider Last Dose Status Informant  aspirin EC 81 MG tablet [id number] Yes Take 81 mg by mouth daily. Swallow whole. [provider] Taking Active Self  Blood Pressure KIT [id number] Yes 1 Units by Does not apply route daily. Check blood pressure daily for hypertension dx code Jackquline Bosch, MD Taking Active Self  cyanocobalamin (VITAMIN B12) 1000 MCG tablet [id number] Yes Take 3,000 mcg by mouth daily. [provider] Taking Active   dapagliflozin propanediol (FARXIGA) 5 MG TABS tablet [id number] Yes Take 1 tablet (5 mg total) by mouth daily. Salvatore Decent, FNP Taking Active   Lancets [facility] [place] PLUS [place]) MISC [id number] Yes USE TO CHECK BLOOD SUGAR TWICE DAILY Dorothyann Peng, MD Taking Active Self  magnesium 30 MG tablet [id number] Yes Take 400  mg by mouth daily. [provider] Taking Active   Multiple Vitamins-Minerals (WOMENS 50+ MULTI VITAMIN) TABS [id number] Yes Take 1 tablet by mouth daily. [provider] Taking Active   Olmesartan-amLODIPine-HCTZ 40-10-12.5 MG TABS [id number] Yes Take 1 tablet by mouth daily. [provider] Taking Active   [facility] VERIO test strip [id number] Yes USE TO TEST 2 TIMES DAILY Dorothyann Peng, MD Taking Active Self  pantoprazole (PROTONIX) 40 MG tablet [id number] Yes Take 40 mg by mouth daily. [provider] Taking Active            Med Note Orvilla Cornwall Oct 22, 2023  9:40 AM) As needed  rosuvastatin (CRESTOR) 40 MG tablet [id number] Yes Take 1 tablet (40 mg total) by mouth daily. Arnetha Courser, MD Taking Active   solifenacin (VESICARE) 5 MG tablet [id number] Yes Take 1 tablet (5 mg total) by mouth daily. Salvatore Decent, FNP Taking Active            Med Note Orvilla Cornwall Oct 22, 2023  9:41 AM) As needed            Home Care and Equipment/Supplies: Were Home Health Services Ordered?: No Any new equipment or medical supplies ordered?: No  Functional Questionnaire: Do you need assistance with bathing/showering or dressing?: No Do you need assistance with meal preparation?: No Do you need assistance with eating?: No Do you have difficulty maintaining continence: No Do you need assistance with getting out of bed/getting out of a chair/moving?: No Do you have difficulty managing or taking your medications?: No  Follow up appointments reviewed: PCP Follow-up appointment confirmed?: NA (patient will call back to schedule follow up appointment) Specialist Hospital Follow-up  appointment confirmed?: No Reason Specialist Follow-Up Not Confirmed: Appointment Sceduled by [facility] Calling Clinician Do you need transportation to your follow-up appointment?: No Do you understand care options if your condition(s) worsen?: Yes-patient verbalized  understanding    SIGNATURE Jodelle Green, RMA

## 2023-10-29 ENCOUNTER — Ambulatory Visit: Payer: Medicare Other | Admitting: Emergency Medicine

## 2023-12-06 ENCOUNTER — Ambulatory Visit: Payer: Medicare Other | Admitting: Podiatry

## 2023-12-06 ENCOUNTER — Encounter: Payer: Self-pay | Admitting: Podiatry

## 2023-12-06 DIAGNOSIS — E1122 Type 2 diabetes mellitus with diabetic chronic kidney disease: Secondary | ICD-10-CM

## 2023-12-06 DIAGNOSIS — M79609 Pain in unspecified limb: Secondary | ICD-10-CM | POA: Diagnosis not present

## 2023-12-06 DIAGNOSIS — B351 Tinea unguium: Secondary | ICD-10-CM | POA: Diagnosis not present

## 2023-12-06 DIAGNOSIS — N1831 Chronic kidney disease, stage 3a: Secondary | ICD-10-CM

## 2023-12-06 DIAGNOSIS — L84 Corns and callosities: Secondary | ICD-10-CM

## 2023-12-06 NOTE — Progress Notes (Signed)
 This patient returns to my office for at risk foot care.  This patient requires this care by a professional since this patient will be at risk due to having diabetes and CKD.  This patient is unable to cut nails himself since the patient cannot reach his nails.These nails are painful walking and wearing shoes.  She also has painful corns on her second and third toes right foot. This patient presents for at risk foot care today.  General Appearance  Alert, conversant and in no acute stress.  Vascular  Dorsalis pedis and posterior tibial  pulses are palpable  bilaterally.  Capillary return is within normal limits  bilaterally. Temperature is within normal limits  bilaterally.  Neurologic  Senn-Weinstein monofilament wire test within normal limits  bilaterally. Muscle power within normal limits bilaterally.  Nails Thick disfigured discolored nails with subungual debris  from hallux to fifth toes bilaterally. No evidence of bacterial infection or drainage bilaterally.  Orthopedic  No limitations of motion  feet .  No crepitus or effusions noted.  No bony pathology or digital deformities noted.  Skin  normotropic skin with no porokeratosis noted bilaterally.  No signs of infections or ulcers noted.  Corns second  right foot.   Onychomycosis  Pain in right toes  Pain in left toes  Corns 2, right foot.  Consent was obtained for treatment procedures.   Mechanical debridement of nails 1-5  bilaterally performed with a nail nipper.  Filed with dremel without incident.  Debride corn with # 15 blade and dremel tool. Padding dispensed   Return office visit   3 months                   Told patient to return for periodic foot care and evaluation due to potential at risk complications.   Cordella Bold DPM

## 2023-12-13 ENCOUNTER — Other Ambulatory Visit: Payer: Self-pay

## 2023-12-13 ENCOUNTER — Emergency Department (HOSPITAL_BASED_OUTPATIENT_CLINIC_OR_DEPARTMENT_OTHER)
Admission: EM | Admit: 2023-12-13 | Discharge: 2023-12-13 | Disposition: A | Discharge status: Home / Self Care | Payer: Medicare Other

## 2023-12-13 ENCOUNTER — Emergency Department (HOSPITAL_BASED_OUTPATIENT_CLINIC_OR_DEPARTMENT_OTHER): Payer: Medicare Other | Admitting: Radiology

## 2023-12-13 ENCOUNTER — Encounter (HOSPITAL_BASED_OUTPATIENT_CLINIC_OR_DEPARTMENT_OTHER): Payer: Self-pay | Admitting: Emergency Medicine

## 2023-12-13 ENCOUNTER — Emergency Department (HOSPITAL_BASED_OUTPATIENT_CLINIC_OR_DEPARTMENT_OTHER): Payer: Medicare Other

## 2023-12-13 DIAGNOSIS — R059 Cough, unspecified: Secondary | ICD-10-CM | POA: Diagnosis present

## 2023-12-13 DIAGNOSIS — Z7982 Long term (current) use of aspirin: Secondary | ICD-10-CM | POA: Insufficient documentation

## 2023-12-13 DIAGNOSIS — U071 COVID-19: Secondary | ICD-10-CM | POA: Diagnosis not present

## 2023-12-13 DIAGNOSIS — Z91040 Latex allergy status: Secondary | ICD-10-CM | POA: Insufficient documentation

## 2023-12-13 LAB — RESP PANEL BY RT-PCR (RSV, FLU A&B, COVID)  RVPGX2
Influenza A by PCR: NEGATIVE
Influenza B by PCR: NEGATIVE
Resp Syncytial Virus by PCR: NEGATIVE
SARS Coronavirus 2 by RT PCR: POSITIVE — AB

## 2023-12-13 NOTE — ED Notes (Signed)

## 2023-12-13 NOTE — Discharge Instructions (Signed)
You have COVID.  Treatment is supportive.  May take over-the-counter Tylenol for fevers and generalized malaise.  You may also take over-the-counter Mucinex to help.  Return immediately for follow-up fevers, chest pain, shortness of breath, inability to eat or drink due to nausea and vomiting, severe pain or you develop any new or worsening symptoms that are concerning to you.

## 2023-12-13 NOTE — ED Provider Notes (Signed)
[place] EMERGENCY DEPARTMENT AT [facility] Provider Note   CSN: [id number] Arrival date & time: 12/13/23  0831     History  Chief Complaint  Patient presents with   Cough    [name] is a 83 y.o. female.  This is an 83 year old female presenting emergency department with cough and generalized malaise x 3 days.  Husband recently tested positive for COVID.  Reports nausea, but no vomiting.  No chest pain, no shortness of breath.   Cough      Home Medications Prior to Admission medications   Medication Sig Start Date End Date Taking? Authorizing Provider  estradiol (ESTRACE) 0.1 MG/GM vaginal cream Place 1 Applicatorful vaginally 2 (two) times a week. 11/06/23  Yes [provider]  aspirin EC 81 MG tablet Take 81 mg by mouth daily. Swallow whole.    [provider]  Blood Pressure KIT 1 Units by Does not apply route daily. Check blood pressure daily for hypertension dx code I10 09/14/21   Chilton Si, MD  cyanocobalamin (VITAMIN B12) 1000 MCG tablet Take 3,000 mcg by mouth daily.    [provider]  dapagliflozin propanediol (FARXIGA) 5 MG TABS tablet Take 1 tablet (5 mg total) by mouth daily. 10/22/23   Salvatore Decent, FNP  Lancets [facility] DELICA PLUS [place]) MISC USE TO CHECK BLOOD SUGAR TWICE DAILY 12/24/20   Dorothyann Peng, MD  magnesium 30 MG tablet Take 400 mg by mouth daily.    [provider]  Multiple Vitamins-Minerals (WOMENS 50+ MULTI VITAMIN) TABS Take 1 tablet by mouth daily. 08/24/21   [provider]  Olmesartan-amLODIPine-HCTZ 40-10-12.5 MG TABS Take 1 tablet by mouth daily. 07/05/23   [provider]  Letta Pate VERIO test strip USE TO TEST 2 TIMES DAILY 12/20/20   Dorothyann Peng, MD  pantoprazole (PROTONIX) 40 MG tablet Take 40 mg by mouth daily. 06/02/23   [provider]  rosuvastatin (CRESTOR) 40 MG tablet Take 1 tablet (40 mg total) by mouth daily. 02/13/23   Arnetha Courser, MD   solifenacin (VESICARE) 5 MG tablet Take 1 tablet (5 mg total) by mouth daily. 10/04/23   Salvatore Decent, FNP      Allergies    Bee venom, Latex, Codeine, Hydrocodone, and Oxycodone    Review of Systems   Review of Systems  Respiratory:  Positive for cough.     Physical Exam Updated Vital Signs BP 118/63 (BP Location: Right Arm)   Pulse 77   Temp 97.9 F (36.6 C)   Resp 20   SpO2 97%  Physical Exam Vitals and nursing note reviewed.  Constitutional:      General: She is not in acute distress.    Appearance: She is obese.  HENT:     Head: Normocephalic and atraumatic.     Nose: Nose normal.     Mouth/Throat:     Mouth: Mucous membranes are moist.  Eyes:     Conjunctiva/sclera: Conjunctivae normal.  Cardiovascular:     Rate and Rhythm: Normal rate and regular rhythm.  Pulmonary:     Effort: Pulmonary effort is normal.     Breath sounds: Normal breath sounds.  Abdominal:     General: Abdomen is flat. There is no distension.     Tenderness: There is no abdominal tenderness.  Musculoskeletal:     Right lower leg: No edema.     Left lower leg: No edema.  Skin:    General: Skin is warm.     Capillary  Refill: Capillary refill takes less than 2 seconds.  Neurological:     Mental Status: She is alert and oriented to person, place, and time.  Psychiatric:        Mood and Affect: Mood normal.        Behavior: Behavior normal.     ED Results / Procedures / Treatments   Labs (all labs ordered are listed, but only abnormal results are displayed) Labs Reviewed  RESP PANEL BY RT-PCR (RSV, FLU A&B, COVID)  RVPGX2 - Abnormal; Notable for the following components:      Result Value   SARS Coronavirus 2 by RT PCR POSITIVE (*)    All other components within normal limits    EKG None  Radiology DG Chest 2 View Result Date: 12/13/2023 CLINICAL DATA:  Cough and weakness for the past 3 days. COVID exposure. EXAM: CHEST - 2 VIEW COMPARISON:  Chest x-ray dated October 21, 2023.  FINDINGS: The heart remains at the upper limits of normal in size. Normal pulmonary vascularity. Unchanged moderate eventration of the right hemidiaphragm. No focal consolidation, pleural effusion, or pneumothorax. No acute osseous abnormality. IMPRESSION: 1. No acute cardiopulmonary disease. Electronically Signed   By: Obie Dredge M.D.   On: 12/13/2023 10:05    Procedures Procedures    Medications Ordered in ED Medications - No data to display  ED Course/ Medical Decision Making/ A&P                                 Medical Decision Making This is an 83 year old female presenting emergency department with viral URI type syndromes.  She is afebrile nontachycardic hemodynamically stable.  Maintaining oxygen saturation on room air.  Clinically well-appearing, no evidence of dehydration on exam or vital signs.  Clear lungs.  Strongly suspect viral etiology given husband's positive COVID test and similar symptoms.  She does have COVID.  Given age and comorbid medical conditions concern for possible pneumonia.  Chest x-ray however negative.  She is outside the window for Paxlovid.  Discussed with supportive care with over-the-counter medications.  Low suspicion for systemic infections or severe metabolic derangements.  Stable for discharge at this time  Amount and/or Complexity of Data Reviewed Radiology: ordered.          Final Clinical Impression(s) / ED Diagnoses Final diagnoses:  None    Rx / DC Orders ED Discharge Orders     None         Coral Spikes, DO 12/13/23 1045

## 2023-12-13 NOTE — ED Triage Notes (Addendum)
Pt c/o cough and weakness x 3 days. [place] dx with covid last week

## 2023-12-14 ENCOUNTER — Telehealth: Payer: Self-pay

## 2023-12-14 NOTE — Transitions of Care (Post Inpatient/ED Visit) (Signed)
   12/14/2023  Name: SHEREKA LAFORTUNE MRN: [id number] DOB: September 22, 1941  Today's TOC FU Call Status: Today's TOC FU Call Status:: Successful TOC FU Call Completed TOC FU Call Complete Date: 12/13/23 Patient's Name and Date of Birth confirmed.  Transition Care Management Follow-up Telephone Call Date of Discharge: 12/13/23  Items Reviewed: Any new allergies since your discharge?: No Dietary orders reviewed?: NA Do you have support at home?: Yes People in Home: child(ren), adult  Medications Reviewed Today: Medications Reviewed Today   Medications were not reviewed in this encounter     Home Care and Equipment/Supplies: Were Home Health Services Ordered?: NA Any new equipment or medical supplies ordered?: NA  Functional Questionnaire: Do you need assistance with bathing/showering or dressing?: No Do you need assistance with meal preparation?: No Do you need assistance with eating?: No Do you have difficulty maintaining continence: No Do you need assistance with getting out of bed/getting out of a chair/moving?: No Do you have difficulty managing or taking your medications?: No  Follow up appointments reviewed: PCP Follow-up appointment confirmed?: No (patient will call back to schedule appt) MD Provider Line Number:[phone] Given: Yes Specialist Hospital Follow-up appointment confirmed?: NA Do you need transportation to your follow-up appointment?: No Do you understand care options if your condition(s) worsen?: Yes-patient verbalized understanding    SIGNATURE Sowmya Partridge D, CMA

## 2023-12-15 ENCOUNTER — Emergency Department (HOSPITAL_COMMUNITY)
Admission: EM | Admit: 2023-12-15 | Discharge: 2023-12-15 | Disposition: A | Discharge status: Home / Self Care | Payer: Medicare Other | Attending: Emergency Medicine | Admitting: Emergency Medicine

## 2023-12-15 ENCOUNTER — Emergency Department (HOSPITAL_COMMUNITY): Payer: Medicare Other

## 2023-12-15 ENCOUNTER — Other Ambulatory Visit: Payer: Self-pay

## 2023-12-15 DIAGNOSIS — Z79899 Other long term (current) drug therapy: Secondary | ICD-10-CM | POA: Insufficient documentation

## 2023-12-15 DIAGNOSIS — R1310 Dysphagia, unspecified: Secondary | ICD-10-CM

## 2023-12-15 DIAGNOSIS — Z8673 Personal history of transient ischemic attack (TIA), and cerebral infarction without residual deficits: Secondary | ICD-10-CM | POA: Diagnosis not present

## 2023-12-15 DIAGNOSIS — I129 Hypertensive chronic kidney disease with stage 1 through stage 4 chronic kidney disease, or unspecified chronic kidney disease: Secondary | ICD-10-CM | POA: Diagnosis not present

## 2023-12-15 DIAGNOSIS — U071 COVID-19: Secondary | ICD-10-CM | POA: Insufficient documentation

## 2023-12-15 DIAGNOSIS — Z7982 Long term (current) use of aspirin: Secondary | ICD-10-CM | POA: Insufficient documentation

## 2023-12-15 DIAGNOSIS — E1122 Type 2 diabetes mellitus with diabetic chronic kidney disease: Secondary | ICD-10-CM | POA: Diagnosis not present

## 2023-12-15 DIAGNOSIS — R09A2 Foreign body sensation, throat: Secondary | ICD-10-CM | POA: Diagnosis present

## 2023-12-15 DIAGNOSIS — N189 Chronic kidney disease, unspecified: Secondary | ICD-10-CM | POA: Diagnosis not present

## 2023-12-15 DIAGNOSIS — Z91040 Latex allergy status: Secondary | ICD-10-CM | POA: Insufficient documentation

## 2023-12-15 LAB — MAGNESIUM: Magnesium: 1.9 mg/dL (ref 1.7–2.4)

## 2023-12-15 LAB — CBC WITH DIFFERENTIAL/PLATELET
Abs Immature Granulocytes: 0.05 10*3/uL (ref 0.00–0.07)
Basophils Absolute: 0.1 10*3/uL (ref 0.0–0.1)
Basophils Relative: 1 %
Eosinophils Absolute: 0.1 10*3/uL (ref 0.0–0.5)
Eosinophils Relative: 1 %
HCT: 43.8 % (ref 36.0–46.0)
Hemoglobin: 14.4 g/dL (ref 12.0–15.0)
Immature Granulocytes: 1 %
Lymphocytes Relative: 21 %
Lymphs Abs: 1.9 10*3/uL (ref 0.7–4.0)
MCH: 29 pg (ref 26.0–34.0)
MCHC: 32.9 g/dL (ref 30.0–36.0)
MCV: 88.3 fL (ref 80.0–100.0)
Monocytes Absolute: 1.1 10*3/uL — ABNORMAL HIGH (ref 0.1–1.0)
Monocytes Relative: 12 %
Neutro Abs: 6.2 10*3/uL (ref 1.7–7.7)
Neutrophils Relative %: 64 %
Platelets: 196 10*3/uL (ref 150–400)
RBC: 4.96 MIL/uL (ref 3.87–5.11)
RDW: 13 % (ref 11.5–15.5)
WBC: 9.5 10*3/uL (ref 4.0–10.5)
nRBC: 0 % (ref 0.0–0.2)

## 2023-12-15 LAB — BASIC METABOLIC PANEL
Anion gap: 10 (ref 5–15)
BUN: 29 mg/dL — ABNORMAL HIGH (ref 8–23)
CO2: 24 mmol/L (ref 22–32)
Calcium: 8.9 mg/dL (ref 8.9–10.3)
Chloride: 103 mmol/L (ref 98–111)
Creatinine, Ser: 1.17 mg/dL — ABNORMAL HIGH (ref 0.44–1.00)
GFR, Estimated: 47 mL/min — ABNORMAL LOW (ref >60)
Glucose, Bld: 142 mg/dL — ABNORMAL HIGH (ref 70–99)
Potassium: 3.5 mmol/L (ref 3.5–5.1)
Sodium: 137 mmol/L (ref 135–145)

## 2023-12-15 MED ORDER — LACTATED RINGERS IV BOLUS
500.0000 mL | Freq: Once | INTRAVENOUS | Status: AC
  Administered 2023-12-15: 500 mL via INTRAVENOUS

## 2023-12-15 MED ORDER — ALUM & MAG HYDROXIDE-SIMETH 200-200-20 MG/5ML PO SUSP
30.0000 mL | Freq: Once | ORAL | Status: AC
  Administered 2023-12-15: 30 mL via ORAL
  Filled 2023-12-15: qty 30

## 2023-12-15 MED ORDER — LIDOCAINE VISCOUS HCL 2 % MT SOLN: 15.0000 mL | Freq: Four times a day (QID) | OROMUCOSAL | 0 refills | Status: DC | PRN

## 2023-12-15 MED ORDER — LIDOCAINE VISCOUS HCL 2 % MT SOLN
15.0000 mL | Freq: Once | OROMUCOSAL | Status: AC
  Administered 2023-12-15: 15 mL via ORAL
  Filled 2023-12-15: qty 15

## 2023-12-15 NOTE — ED Provider Notes (Signed)
[place] EMERGENCY DEPARTMENT AT [facility] Provider Note   CSN: [id number] Arrival date & time: 12/15/23  8295     History  Chief Complaint  Patient presents with   Airway Obstruction    [name] is a 83 y.o. female.  HPI Patient presents for globus sensation.  Medical history includes DM, HTN, HLD, GERD, arthritis, CKD, TIA.  She was seen at [facility] 2 days ago for 3 days of cough and malaise.  She tested positive for COVID-19 at the time.  She was advised supportive care at home.  She has been taking Tylenol and Mucinex.  Last night, she went to take both tablets.  When she swallowed them, she had a sensation like they were caught in her throat.  Area of globe sensation was between suprasternal notch and thyroid.  She had odynophagia throughout the night.  This has improved today, however, still present.  She has been able to eat and drink.  She has not tried taking any other tablets.    Home Medications Prior to Admission medications   Medication Sig Start Date End Date Taking? Authorizing Provider  lidocaine (XYLOCAINE) 2 % solution Use as directed 15 mLs in the mouth or throat every 6 (six) hours as needed (painful swallowing). 12/15/23  Yes Gloris Manchester, MD  aspirin EC 81 MG tablet Take 81 mg by mouth daily. Swallow whole.    [provider]  Blood Pressure KIT 1 Units by Does not apply route daily. Check blood pressure daily for hypertension dx code I10 09/14/21   Chilton Si, MD  cyanocobalamin (VITAMIN B12) 1000 MCG tablet Take 3,000 mcg by mouth daily.    [provider]  dapagliflozin propanediol (FARXIGA) 5 MG TABS tablet Take 1 tablet (5 mg total) by mouth daily. 10/22/23   Salvatore Decent, FNP  estradiol (ESTRACE) 0.1 MG/GM vaginal cream Place 1 Applicatorful vaginally 2 (two) times a week. 11/06/23   [provider]  Lancets (ONETOUCH DELICA PLUS LANCET33G) MISC USE TO CHECK BLOOD SUGAR TWICE DAILY 12/24/20    Dorothyann Peng, MD  magnesium 30 MG tablet Take 400 mg by mouth daily.    [provider]  Multiple Vitamins-Minerals (WOMENS 50+ MULTI VITAMIN) TABS Take 1 tablet by mouth daily. 08/24/21   [provider]  Olmesartan-amLODIPine-HCTZ 40-10-12.5 MG TABS Take 1 tablet by mouth daily. 07/05/23   [provider]  Letta Pate VERIO test strip USE TO TEST 2 TIMES DAILY 12/20/20   Dorothyann Peng, MD  pantoprazole (PROTONIX) 40 MG tablet Take 40 mg by mouth daily. 06/02/23   [provider]  rosuvastatin (CRESTOR) 40 MG tablet Take 1 tablet (40 mg total) by mouth daily. 02/13/23   Arnetha Courser, MD  solifenacin (VESICARE) 5 MG tablet Take 1 tablet (5 mg total) by mouth daily. 10/04/23   Salvatore Decent, FNP      Allergies    Bee venom, Latex, Codeine, Hydrocodone, and Oxycodone    Review of Systems   Review of Systems  HENT:  Positive for sore throat and trouble swallowing.   All other systems reviewed and are negative.   Physical Exam Updated Vital Signs BP (!) 142/87 (BP Location: Right Arm)   Pulse 82   Temp 98.4 F (36.9 C) (Oral)   Resp 16   Ht 5\' 7"  (1.702 m)   Wt 103.9 kg   SpO2 98%   BMI 35.87 kg/m  Physical Exam Vitals and nursing note reviewed.  Constitutional:  General: She is not in acute distress.    Appearance: Normal appearance. She is well-developed. She is not ill-appearing, toxic-appearing or diaphoretic.  HENT:     Head: Normocephalic and atraumatic.     Right Ear: External ear normal.     Left Ear: External ear normal.     Nose: Nose normal.     Mouth/Throat:     Mouth: Mucous membranes are moist.     Pharynx: Oropharynx is clear. No oropharyngeal exudate or posterior oropharyngeal erythema.  Eyes:     Conjunctiva/sclera: Conjunctivae normal.  Cardiovascular:     Rate and Rhythm: Normal rate and regular rhythm.  Pulmonary:     Effort: Pulmonary effort is normal. No respiratory distress.     Breath sounds: No stridor. No  wheezing or rales.  Abdominal:     General: There is no distension.     Palpations: Abdomen is soft.     Tenderness: There is no abdominal tenderness.  Musculoskeletal:        General: No swelling or deformity.     Cervical back: Normal range of motion and neck supple. No rigidity.  Skin:    General: Skin is warm and dry.     Capillary Refill: Capillary refill takes less than 2 seconds.     Coloration: Skin is not jaundiced or pale.  Neurological:     General: No focal deficit present.     Mental Status: She is alert and oriented to person, place, and time.     Cranial Nerves: No cranial nerve deficit.     Sensory: No sensory deficit.     Motor: No weakness.     Coordination: Coordination normal.  Psychiatric:        Mood and Affect: Mood normal.        Behavior: Behavior normal.     ED Results / Procedures / Treatments   Labs (all labs ordered are listed, but only abnormal results are displayed) Labs Reviewed  CBC WITH DIFFERENTIAL/PLATELET - Abnormal; Notable for the following components:      Result Value   Monocytes Absolute 1.1 (*)    All other components within normal limits  BASIC METABOLIC PANEL - Abnormal; Notable for the following components:   Glucose, Bld 142 (*)    BUN 29 (*)    Creatinine, Ser 1.17 (*)    GFR, Estimated 47 (*)    All other components within normal limits  MAGNESIUM    EKG None  Radiology DG Neck Soft Tissue Result Date: 12/15/2023 CLINICAL DATA:  Sore throat. EXAM: NECK SOFT TISSUES-2 VIEW COMPARISON:  None Available. FINDINGS: No radiopaque foreign bodies. Normal epiglottis. Degenerative changes with disc space narrowing and marginal osteophytes C2-3 through C5-6. Grade 1 C4 retrolisthesis. No soft tissue swelling or posterior element distraction to suggest an acute etiology. There is nonvisualization of the cervicothoracic junction on the lateral view. Prevertebral and cervicocranial soft tissues are unremarkable. IMPRESSION:  Degenerative disc disease. Unremarkable appearance of the neck soft tissues. Electronically Signed   By: Layla Maw M.D.   On: 12/15/2023 13:28   DG Chest Portable 1 View Result Date: 12/15/2023 CLINICAL DATA:  Globus sensation EXAM: PORTABLE CHEST 1 VIEW COMPARISON:  12/13/2023 FINDINGS: The heart size and mediastinal contours are stable. Prominent elevation of the right hemidiaphragm. No focal airspace consolidation, pleural effusion, or pneumothorax. No radiopaque foreign bodies are evident within the chest. The visualized skeletal structures are unremarkable. IMPRESSION: 1. No active disease. 2. Prominent elevation of the right  hemidiaphragm. Electronically Signed   By: Duanne Guess D.O.   On: 12/15/2023 13:26    Procedures Procedures    Medications Ordered in ED Medications  alum & mag hydroxide-simeth (MAALOX/MYLANTA) 200-200-20 MG/5ML suspension 30 mL (30 mLs Oral Given 12/15/23 1012)    And  lidocaine (XYLOCAINE) 2 % viscous mouth solution 15 mL (15 mLs Oral Given 12/15/23 1013)  lactated ringers bolus 500 mL (500 mLs Intravenous New Bag/Given 12/15/23 1431)    ED Course/ Medical Decision Making/ A&P                                 Medical Decision Making Amount and/or Complexity of Data Reviewed Labs: ordered. Radiology: ordered.  Risk OTC drugs. Prescription drug management.   This patient presents to the ED for concern of sore throat, this involves an extensive number of treatment options, and is a complaint that carries with it a high risk of complications and morbidity.  The differential diagnosis includes esophageal abrasion, pill esophagitis, URI, foreign body   Co morbidities that complicate the patient evaluation  DM, HTN, HLD, GERD, arthritis, CKD, TIA   Additional history obtained:  Additional history obtained from N/A External records from outside source obtained and reviewed including EMR   Lab Tests:  I Ordered, and personally interpreted  labs.  The pertinent results include: Normal hemoglobin, no leukocytosis, baseline creatinine, normal electrolytes   Imaging Studies ordered:  I ordered imaging studies including x-ray of chest and neck I independently visualized and interpreted imaging which showed no acute findings I agree with the radiologist interpretation   Cardiac Monitoring: / EKG:  The patient was maintained on a cardiac monitor.  I personally viewed and interpreted the cardiac monitored which showed an underlying rhythm of: Sinus rhythm  Problem List / ED Course / Critical interventions / Medication management  Patient presents for throat irritation after swallowing some pills yesterday.  On arrival in the ED, airway is intact.  She has no evidence of stridor or voice change.  Oropharynx is widely patent without any erythema.  Breathing is unlabored.  I have low suspicion for obstruction.  Her pain has improved since last night.  She was given viscous lidocaine to help with her symptoms.  She was able to eat crackers and drink water.  X-ray imaging of chest and neck were negative for acute findings.  Patient likely sustained an abrasion to her upper esophagus yesterday.  She states that her pain has improved further while in the ED.  She also states that she is just feeling quite weak.  I suspect this is from her ongoing COVID-19 infection.  IV fluids were ordered.  Basic lab work was obtained, the results of which were unremarkable.  Patient was discharged in stable condition. I ordered medication including Maalox and viscous lidocaine for odynophagia: IV fluids for hydration Reevaluation of the patient after these medicines showed that the patient improved I have reviewed the patients home medicines and have made adjustments as needed   Social Determinants of Health:  Has PCP        Final Clinical Impression(s) / ED Diagnoses Final diagnoses:  Painful swallowing  COVID-19    Rx / DC Orders ED  Discharge Orders          Ordered    lidocaine (XYLOCAINE) 2 % solution  Every 6 hours PRN        12/15/23 1440  Gloris Manchester, MD 12/15/23 [phone]

## 2023-12-15 NOTE — Discharge Instructions (Addendum)
Test results today are reassuring.  A prescription for viscous lidocaine was sent to your pharmacy.  Take as needed for discomfort with swallowing.  Ensure that you take pills with plenty of water.  If you would like to follow-up with the ear, nose, and throat doctor for further evaluation, there is a telephone number below.  Return to the emergency department for any new or worsening symptoms of concern.

## 2023-12-15 NOTE — ED Triage Notes (Signed)
Patient reports taking two large tylenol pills and other pills yesterday and says the pills are lodged in her throat. C/o sore throat pain 4/10

## 2023-12-17 ENCOUNTER — Telehealth: Payer: Self-pay

## 2023-12-17 NOTE — Transitions of Care (Post Inpatient/ED Visit) (Signed)
12/17/2023  Name: [name] MRN: [id number] DOB: 1941-01-17  Today's TOC FU Call Status: Today's TOC FU Call Status:: Successful TOC FU Call Completed TOC FU Call Complete Date: 12/17/23 Patient's Name and Date of Birth confirmed.  Transition Care Management Follow-up Telephone Call Date of Discharge: 12/15/23 Discharge Facility: [facility]) Type of Discharge: Emergency Department Reason for ED Visit: Other: (difficulty swallowing) How have you been since you were released from the hospital?: Same Any questions or concerns?: Yes Patient Questions/Concerns:: pain when swallowing Patient Questions/Concerns Addressed: Provided Patient Educational Materials  Items Reviewed: Did you receive and understand the discharge instructions provided?: Yes Medications obtained,verified, and reconciled?: Yes (Medications Reviewed) Any new allergies since your discharge?: No Dietary orders reviewed?: NA Do you have support at home?: Yes People in Home: child(ren), dependent Name of Support/Comfort Primary Source: children (daugthers)  Medications Reviewed Today: Medications Reviewed Today     Reviewed by Leroy Kennedy, CMA (Certified Medical Assistant) on 12/17/23 at 1055  Med List Status: <None>   Medication Order Taking? Sig Documenting Provider Last Dose Status Informant  aspirin EC 81 MG tablet [id number] Yes Take 81 mg by mouth daily. Swallow whole. [provider] Taking Active Self  Blood Pressure KIT [id number] Yes 1 Units by Does not apply route daily. Check blood pressure daily for hypertension dx code Jackquline Bosch, MD Taking Active Self  cyanocobalamin (VITAMIN B12) 1000 MCG tablet [id number] Yes Take 3,000 mcg by mouth daily. [provider] Taking Active   dapagliflozin propanediol (FARXIGA) 5 MG TABS tablet [id number] Yes Take 1 tablet (5 mg total) by mouth daily. Salvatore Decent, FNP Taking Active   estradiol (ESTRACE) 0.1 MG/GM vaginal cream  [id number] Yes Place 1 Applicatorful vaginally 2 (two) times a week. [provider] Taking Active   Lancets [facility] DELICA PLUS [place]) MISC [id number] Yes USE TO CHECK BLOOD SUGAR TWICE DAILY Dorothyann Peng, MD Taking Active Self  lidocaine (XYLOCAINE) 2 % solution [id number] Yes Use as directed 15 mLs in the mouth or throat every 6 (six) hours as needed (painful swallowing). Gloris Manchester, MD Taking Active   magnesium 30 MG tablet [id number] Yes Take 400 mg by mouth daily. [provider] Taking Active   Multiple Vitamins-Minerals (WOMENS 50+ MULTI VITAMIN) TABS [id number] Yes Take 1 tablet by mouth daily. [provider] Taking Active   Olmesartan-amLODIPine-HCTZ 40-10-12.5 MG TABS [id number] Yes Take 1 tablet by mouth daily. [provider] Taking Active   [facility] VERIO test strip [id number] Yes USE TO TEST 2 TIMES DAILY Dorothyann Peng, MD Taking Active Self  pantoprazole (PROTONIX) 40 MG tablet [id number] Yes Take 40 mg by mouth daily. [provider] Taking Active            Med Note Orvilla Cornwall Oct 22, 2023  9:40 AM) As needed  rosuvastatin (CRESTOR) 40 MG tablet [id number] Yes Take 1 tablet (40 mg total) by mouth daily. Arnetha Courser, MD Taking Active   solifenacin (VESICARE) 5 MG tablet [id number] Yes Take 1 tablet (5 mg total) by mouth daily. Salvatore Decent, FNP Taking Active            Med Note Orvilla Cornwall Oct 22, 2023  9:41 AM) As needed            Home Care and Equipment/Supplies: Were Home Health Services Ordered?: No Any new equipment or medical supplies ordered?: No  Functional Questionnaire: Do you need assistance with bathing/showering or  dressing?: No Do you need assistance with meal preparation?: No Do you need assistance with eating?: No Do you have difficulty maintaining continence: No Do you need assistance with getting out of bed/getting out of a chair/moving?: No Do you have difficulty managing or  taking your medications?: No  Follow up appointments reviewed: PCP Follow-up appointment confirmed?: Yes MD Provider Line Number:[phone] Given: Yes Date of PCP follow-up appointment?: 12/18/23 Follow-up Provider: Salvatore Decent NP Specialist Hospital Follow-up appointment confirmed?: NA Reason Specialist Follow-Up Not Confirmed: Appointment Sceduled by [facility] Calling Clinician Do you need transportation to your follow-up appointment?: No Do you understand care options if your condition(s) worsen?: Yes-patient verbalized understanding    SIGNATURE Jenny Reichmann

## 2023-12-18 ENCOUNTER — Ambulatory Visit (INDEPENDENT_AMBULATORY_CARE_PROVIDER_SITE_OTHER): Payer: Medicare Other | Admitting: Internal Medicine

## 2023-12-18 ENCOUNTER — Encounter: Payer: Self-pay | Admitting: Internal Medicine

## 2023-12-18 VITALS — BP 108/80 | HR 66 | Temp 97.9°F | Ht 67.0 in | Wt 234.2 lb

## 2023-12-18 DIAGNOSIS — R09A2 Foreign body sensation, throat: Secondary | ICD-10-CM

## 2023-12-18 DIAGNOSIS — J029 Acute pharyngitis, unspecified: Secondary | ICD-10-CM

## 2023-12-18 DIAGNOSIS — U071 COVID-19: Secondary | ICD-10-CM | POA: Diagnosis not present

## 2023-12-18 NOTE — Progress Notes (Unsigned)
[facility] PRIMARY CARE [facility] [address] [place] [address] Dept: [phone] Dept Fax: [phone]    Subjective:   [name] Sep 27, 1941 12/18/2023  Chief Complaint  Patient presents with   Hospitalization Follow-up    HPI: [name] presents today for re-assessment and management of chronic medical conditions.  Discussed the use of AI scribe software for clinical note transcription with the patient, who gave verbal consent to proceed.  History of Present Illness   The patient presents for ER follow-up visit.  Patient was seen on 12/13/2023 in the emergency room, was diagnosed with COVID-19.  She then returned back to the emergency room on 12/15/2023 with difficulty swallowing.  She had reported that a pill had gotten stuck while swallowing, causing significant discomfort and pain. She reports feeling like something was stuck in her throat.  Chest x-ray and CT neck soft tissue were negative.  She was given viscous lidocaine to help with her symptoms.  During ER visit she was able to eat crackers and drink water with no difficulty.  She was discharged with PCP follow-up.  Today patient reports sensation has significantly improved, no trouble swallowing.  She did not pick up the lidocaine prescription as she was hesitant to take this.  She has been managing the pain with warm salt water gargles and honey and lemon tea.    The following portions of the patient's history were reviewed and updated as appropriate: past medical history, past surgical history, family history, social history, allergies, medications, and problem list.   Patient Active Problem List   Diagnosis Date Noted   Hypoglycemia 06/23/2023   History of TIA (transient ischemic attack) 02/12/2023   OSA on CPAP 10/19/2022   Urge incontinence 08/23/2022   Chronic kidney disease, stage 3a (HCC) 07/17/2021   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   Aortic  atherosclerosis (HCC) 01/03/2021   Class 2 severe obesity due to excess calories with serious comorbidity and body mass index (BMI) of 39.0 to 39.9 in adult (HCC) 01/03/2021   Vitamin D deficiency 06/01/2020   History of arthroplasty of right knee 05/11/2020   Constipation 06/26/2018   Type 2 diabetes mellitus with other specified complication (HCC) 06/19/2014   OA (osteoarthritis) of knee 01/05/2012   LBBB (left bundle branch block) 07/26/2010   Gastroesophageal reflux disease without esophagitis 10/06/2008   History of colonic polyps 08/26/2008   Hyperlipidemia associated with type 2 diabetes mellitus (HCC) 06/03/2007   Essential hypertension 06/03/2007   Past Medical History:  Diagnosis Date   Abdominal bloating 02/17/2019   Abdominal pain    AKI (acute kidney injury) (HCC) 02/12/2023   Arthritis    cervical disc degeneration/ oa left knee, carpal tunnel rt wrist, adhesive capsulitis right shoulder, rt hand weakness; lumbar degeneration   Carpal tunnel syndrome    CARPAL TUNNEL SYNDROME, RIGHT 10/14/2010   Qualifier: Diagnosis of   By: Rodena Medin MD, Acie Fredrickson        CHEST PAIN, ATYPICAL 07/01/2010   Qualifier: Diagnosis of   By: Abner Greenspan MD, Elio Forget acquired pneumonia 05/06/2020   Complication of anesthesia 2006-at [facility]   breathing problems-no BP med given prior to surgery;  hx of being very sleepy after colon surgery --  states no problems with last right total knee replacement 2013   Constipation    Diabetes mellitus without complication (HCC)    borderline - diet control   Diverticulitis hx of   Diverticulosis  E. coli infection    2020   Frequent UTI    hx of urethral injury during colon surgery - states frequent uti's since   GERD (gastroesophageal reflux disease)    Gout, unspecified 09/09/2007   Qualifier: Diagnosis of   By: Lovell Sheehan MD, Balinda Quails        H. pylori infection 07/18/2010   H/O hiatal hernia    HIP PAIN, LEFT, CHRONIC 11/09/2010    Qualifier: Diagnosis of   By: Lovell Sheehan MD, Balinda Quails        History of colon polyps 08/26/2008   History of palpitations    in the past   History of shingles    has a lingering itching on back where shingles were   Hyperlipidemia    Hypertension    Hypokalemia 10/19/2022   KNEE PAIN, RIGHT, CHRONIC 10/14/2010   Qualifier: Diagnosis of   By: Rodena Medin MD, Acie Fredrickson        Lightheaded 02/17/2019   Morbidly obese (HCC) 05/31/2011   bmi of 44     Numbness and tingling in right hand    pt. 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   Shortness of breath 09/14/2021   Shortness of breath 09/14/2021   Spasm of lumbar paraspinous muscle 06/17/2011   Stroke (HCC)    Stroke-like symptom    Subacute intracranial hemorrhage (HCC) 10/18/2022   Transient speech disturbance 04/18/2022   Urinary frequency 02/17/2019   Vaginal discharge 08/02/2011   Vitamin D deficiency    Past Surgical History:  Procedure Laterality Date   ABDOMINAL HYSTERECTOMY     bladder tack     BREAST BIOPSY Left    BREAST SURGERY     breast duct resection- benign   CARPAL TUNNEL RELEASE Right 06/04/2014   Procedure: RIGHT CARPAL TUNNEL RELEASE;  Surgeon: Dominica Severin, MD;  Location: [facility];  Service: Orthopedics;  Laterality: Right;   COLON RESECTION  2008   hx diverticulosis   COLON SURGERY     COLONOSCOPY     colonoscopy [id number]     JOINT REPLACEMENT     OOPHORECTOMY     POLYPECTOMY     skin graft left arm - traumatic compression injury left upper arm     temporary ureter stent     TOTAL KNEE ARTHROPLASTY  01/05/2012   Procedure: TOTAL KNEE ARTHROPLASTY;  Surgeon: Loanne Drilling, MD;  Location: [facility];   Service: Orthopedics;  Laterality: Right;   TOTAL KNEE ARTHROPLASTY Left 08/17/2014   Procedure: LEFT TOTAL KNEE ARTHROPLASTY;  Surgeon: Loanne Drilling, MD;  Location: [facility];  Service: Orthopedics;  Laterality: Left;   ureter repair for tyransected left ureter     Family History  Problem Relation Age of Onset   Breast cancer Mother 57   Hypertension Mother    Prostate cancer Father    Hypertension Father    Dementia Sister    Lung cancer Brother    Hypertension Brother    COPD Brother    Cancer Maternal Grandmother    Arthritis Sister    Hypertension Sister    Arthritis Sister    Hypertension Child  Hypertension Child    Hypertension Child    Colon polyps Neg Hx    Esophageal cancer Neg Hx    Rectal cancer Neg Hx    Stomach cancer Neg Hx    Colon cancer Neg Hx     Current Outpatient Medications:    aspirin EC 81 MG tablet, Take 81 mg by mouth daily. Swallow whole., Disp: , Rfl:    Blood Pressure KIT, 1 Units by Does not apply route daily. Check blood pressure daily for hypertension dx code I10, Disp: 1 kit, Rfl: 0   cyanocobalamin (VITAMIN B12) 1000 MCG tablet, Take 3,000 mcg by mouth daily., Disp: , Rfl:    estradiol (ESTRACE) 0.1 MG/GM vaginal cream, Place 1 Applicatorful vaginally 2 (two) times a week., Disp: , Rfl:    Lancets (ONETOUCH DELICA PLUS LANCET33G) MISC, USE TO CHECK BLOOD SUGAR TWICE DAILY, Disp: 100 each, Rfl: 3   magnesium 30 MG tablet, Take 400 mg by mouth daily., Disp: , Rfl:    Olmesartan-amLODIPine-HCTZ 40-10-12.5 MG TABS, Take 1 tablet by mouth daily., Disp: , Rfl:    ONETOUCH VERIO test strip, USE TO TEST 2 TIMES DAILY, Disp: 100 strip, Rfl: 3   rosuvastatin (CRESTOR) 40 MG tablet, Take 1 tablet (40 mg total) by mouth daily., Disp: 90 tablet, Rfl: 0   solifenacin (VESICARE) 5 MG tablet, Take 1 tablet (5 mg total) by mouth daily., Disp: 90 tablet, Rfl: 1   dapagliflozin propanediol (FARXIGA) 5 MG TABS tablet, Take 1 tablet (5 mg total) by mouth  daily. (Patient not taking: Reported on 12/18/2023), Disp: 30 tablet, Rfl: 3   lidocaine (XYLOCAINE) 2 % solution, Use as directed 15 mLs in the mouth or throat every 6 (six) hours as needed (painful swallowing). (Patient not taking: Reported on 12/18/2023), Disp: 100 mL, Rfl: 0   Multiple Vitamins-Minerals (WOMENS 50+ MULTI VITAMIN) TABS, Take 1 tablet by mouth daily., Disp: , Rfl:    pantoprazole (PROTONIX) 40 MG tablet, Take 40 mg by mouth daily. (Patient not taking: Reported on 12/18/2023), Disp: , Rfl:  Allergies  Allergen Reactions   Bee Venom Hives   Latex Hives and Itching   Codeine Other (See Comments)    Hallucinations    Hydrocodone Other (See Comments)    hallucinations   Oxycodone Other (See Comments)    hallucinations     ROS: A complete ROS was performed with pertinent positives/negatives noted in the HPI. The remainder of the ROS are negative.    Objective:   Today's Vitals   12/18/23 1432  BP: 108/80  Pulse: 66  Temp: 97.9 F (36.6 C)  TempSrc: Temporal  SpO2: 98%  Weight: 234 lb 3.2 oz (106.2 kg)  Height: 5\' 7"  (1.702 m)    GENERAL: Well-appearing, in NAD. Well nourished.  SKIN: Pink, warm and dry. No rash, lesion, ulceration, or ecchymoses.  NECK: Trachea midline. Full ROM w/o pain or tenderness. No lymphadenopathy.  RESPIRATORY: Chest wall symmetrical. Respirations even and non-labored. Breath sounds clear to auscultation bilaterally.  CARDIAC: S1, S2 present, regular rate and rhythm. Peripheral pulses 2+ bilaterally.  EXTREMITIES: Without clubbing, cyanosis, or edema.  PSYCH/MENTAL STATUS: Alert, oriented x 3. Cooperative, appropriate mood and affect.   Health Maintenance Due  Topic Date Due   Zoster Vaccines- Shingrix (1 of 2) Never done   Pneumonia Vaccine 46+ Years old (2 of 2 - PCV) 11/21/2014   OPHTHALMOLOGY EXAM  02/03/2022   Diabetic kidney evaluation - Urine ACR  07/12/2022   COVID-19 Vaccine (  4 - 2024-25 season) 07/01/2023   DTaP/Tdap/Td (3  - Tdap) 11/22/2023    No results found for any visits on 12/18/23.  The ASCVD Risk score (Arnett DK, et al., 2019) failed to calculate for the following reasons:   The 2019 ASCVD risk score is only valid for ages 41 to 60   Risk score cannot be calculated because patient has a medical history suggesting prior/existing ASCVD     Assessment & Plan:  Assessment and Plan    Globus Sensation -improved -Continue warm salt water gargles 2-3 times a day. -Use Tylenol as needed for pain. -Consider referral to GI specialist if swallowing difficulties persist or worsen.   No orders of the defined types were placed in this encounter.  No images are attached to the encounter or orders placed in the encounter. No orders of the defined types were placed in this encounter.   Return in about 3 months (around 03/16/2024) for Chronic Condition follow up.   Salvatore Decent, FNP

## 2023-12-18 NOTE — Patient Instructions (Signed)
Continue warm salt water gargles  Honey and Lemon water  Tylenol if needed

## 2023-12-20 ENCOUNTER — Other Ambulatory Visit: Payer: Self-pay | Admitting: Nurse Practitioner

## 2023-12-20 DIAGNOSIS — I10 Essential (primary) hypertension: Secondary | ICD-10-CM

## 2023-12-26 ENCOUNTER — Encounter: Payer: Self-pay | Admitting: Internal Medicine

## 2024-01-01 ENCOUNTER — Other Ambulatory Visit: Payer: Self-pay | Admitting: Internal Medicine

## 2024-01-01 MED ORDER — OLMESARTAN-AMLODIPINE-HCTZ 40-10-12.5 MG PO TABS: 1.0000 | ORAL_TABLET | Freq: Every day | ORAL | 3 refills | Status: DC

## 2024-01-01 NOTE — Telephone Encounter (Signed)
 Last Fill: 07/05/23  Last OV: 12/18/23 Hosp FU Next OV: 03/17/24  Routing to provider for review/authorization.

## 2024-01-01 NOTE — Telephone Encounter (Signed)
 Copied from CRM [id number]. Topic: Clinical - Medication Refill >> Jan 01, 2024 12:20 PM [place] J wrote: Most Recent Primary Care Visit:  Provider: Salvatore Decent  Department: [facility]  Visit Type: HOSPITAL FOLLOW UP  Date: 12/18/2023  Medication: Olmesartan-amLODIPine-HCTZ 40-10-12.5 MG TABS  Has the patient contacted their pharmacy? Yes (Agent: If no, request that the patient contact the pharmacy for the refill. If patient does not wish to contact the pharmacy document the reason why and proceed with request.) (Agent: If yes, when and what did the pharmacy advise?)  Is this the correct pharmacy for this prescription? Yes If no, delete pharmacy and type the correct one.  This is the patient's preferred pharmacy:  [id number], [place] - [address] AT [address] [place] [address] Phone: [phone] Fax: [phone]  [id number] - [place], [place] - [address] [place] [address] Phone: [phone] Fax: [phone]   Has the prescription been filled recently? No  Is the patient out of the medication? Yes  Has the patient been seen for an appointment in the last year OR does the patient have an upcoming appointment? Yes  Can we respond through MyChart? Yes  Agent: Please be advised that Rx refills may take up to 3 business days. We ask that you follow-up with your pharmacy.

## 2024-02-05 ENCOUNTER — Telehealth: Payer: Self-pay | Admitting: Neurology

## 2024-02-05 NOTE — Telephone Encounter (Signed)
 Per patient she having problems with her ears and head.  Per patient she had some buzzing or ringing in her ears. On and off. Not Correlate  with the pain In her head.  Pain in her head on and off, lasting about a couple of minutes. Maybe 10 in the last month.

## 2024-02-05 NOTE — Telephone Encounter (Signed)
 Pt. Would like to talk to Nurse/Doc about worsening issues and would like an earlier appt than 6 month in June, place Pt on the waiting list

## 2024-02-05 NOTE — Telephone Encounter (Signed)
 Patient daughter advised of Dr.Jaffe, That isn't typically neurology.  She should follow up with her PCP to see if she needs a hearing test or to see ENT

## 2024-02-16 ENCOUNTER — Emergency Department (HOSPITAL_BASED_OUTPATIENT_CLINIC_OR_DEPARTMENT_OTHER): Admitting: Radiology

## 2024-02-16 ENCOUNTER — Other Ambulatory Visit: Payer: Self-pay

## 2024-02-16 ENCOUNTER — Encounter (HOSPITAL_BASED_OUTPATIENT_CLINIC_OR_DEPARTMENT_OTHER): Payer: Self-pay

## 2024-02-16 ENCOUNTER — Emergency Department (HOSPITAL_BASED_OUTPATIENT_CLINIC_OR_DEPARTMENT_OTHER)
Admission: EM | Admit: 2024-02-16 | Discharge: 2024-02-16 | Disposition: A | Discharge status: Home / Self Care | Attending: Emergency Medicine | Admitting: Emergency Medicine

## 2024-02-16 DIAGNOSIS — E119 Type 2 diabetes mellitus without complications: Secondary | ICD-10-CM | POA: Diagnosis not present

## 2024-02-16 DIAGNOSIS — M19011 Primary osteoarthritis, right shoulder: Secondary | ICD-10-CM | POA: Insufficient documentation

## 2024-02-16 DIAGNOSIS — Z91040 Latex allergy status: Secondary | ICD-10-CM | POA: Insufficient documentation

## 2024-02-16 DIAGNOSIS — Z7982 Long term (current) use of aspirin: Secondary | ICD-10-CM | POA: Insufficient documentation

## 2024-02-16 DIAGNOSIS — I10 Essential (primary) hypertension: Secondary | ICD-10-CM | POA: Diagnosis not present

## 2024-02-16 DIAGNOSIS — M25511 Pain in right shoulder: Secondary | ICD-10-CM | POA: Diagnosis present

## 2024-02-16 MED ORDER — ACETAMINOPHEN 160 MG/5ML PO SOLN
650.0000 mg | Freq: Once | ORAL | Status: AC
  Administered 2024-02-16: 650 mg via ORAL
  Filled 2024-02-16: qty 20.3

## 2024-02-16 MED ORDER — ACETAMINOPHEN 325 MG PO TABS
650.0000 mg | ORAL_TABLET | Freq: Once | ORAL | Status: DC
  Filled 2024-02-16: qty 2

## 2024-02-16 NOTE — Discharge Instructions (Addendum)
 Please follow-up with your primary care provider and the orthopedist I rotation for you today.  Today x-ray shows you have moderate arthritis in your shoulder causing your symptoms.  You may use Tylenol  every 6 hours needed for pain.  Please use ice and avoid heat as he will make the symptoms worse.  If symptoms change or worsen please return to the ER.

## 2024-02-16 NOTE — ED Triage Notes (Signed)
 Patient arrives POV with complaints of worsening right shoulder pain. Rates pain a 9/10. No falls or injuries.

## 2024-02-16 NOTE — ED Notes (Signed)
 Large shoulder immobilizer/sling applied to right arm. Pt tolerated well.

## 2024-02-16 NOTE — ED Provider Notes (Signed)
 [place] EMERGENCY DEPARTMENT AT [facility] Provider Note   CSN: [id number] Arrival date & time: 02/16/24  1308     History  Chief Complaint  Patient presents with   Shoulder Pain    right    [name] is a 83 y.o. female history of diabetes, hypertension, GERD presented for right shoulder pain for the past 3 weeks.  Patient denies any trauma to the area and has had Tylenol  which seems to help.  Patient still able to move and denies any paresthesias or new onset weakness but states that movement does hurt the right shoulder.  Patient thinks she has arthritis.    Home Medications Prior to Admission medications   Medication Sig Start Date End Date Taking? Authorizing Provider  aspirin  EC 81 MG tablet Take 81 mg by mouth daily. Swallow whole.    [provider]  Blood Pressure KIT 1 Units by Does not apply route daily. Check blood pressure daily for hypertension dx code I10 09/14/21   Maudine Sos, MD  cyanocobalamin (VITAMIN B12) 1000 MCG tablet Take 3,000 mcg by mouth daily.    [provider]  dapagliflozin  propanediol (FARXIGA ) 5 MG TABS tablet Take 1 tablet (5 mg total) by mouth daily. Patient not taking: Reported on 12/18/2023 10/22/23   [name], FNP  estradiol (ESTRACE) 0.1 MG/GM vaginal cream Place 1 Applicatorful vaginally 2 (two) times a week. 11/06/23   [provider]  Lancets (ONETOUCH DELICA PLUS LANCET33G) MISC USE TO CHECK BLOOD SUGAR TWICE DAILY 12/24/20   Cleave Curling, MD  lidocaine  (XYLOCAINE ) 2 % solution Use as directed 15 mLs in the mouth or throat every 6 (six) hours as needed (painful swallowing). Patient not taking: Reported on 12/18/2023 12/15/23   Iva Mariner, MD  magnesium  30 MG tablet Take 400 mg by mouth daily.    [provider]  Multiple Vitamins-Minerals (WOMENS 50+ MULTI VITAMIN) TABS Take 1 tablet by mouth daily. 08/24/21   [provider]  Olmesartan -amLODIPine -HCTZ 40-10-12.5 MG  TABS Take 1 tablet by mouth daily. 01/01/24   [name], FNP  [facility] VERIO test strip USE TO TEST 2 TIMES DAILY 12/20/20   Cleave Curling, MD  pantoprazole  (PROTONIX ) 40 MG tablet Take 40 mg by mouth daily. Patient not taking: Reported on 12/18/2023 06/02/23   [provider]  rosuvastatin  (CRESTOR ) 40 MG tablet Take 1 tablet (40 mg total) by mouth daily. 02/13/23   Luna Salinas, MD  solifenacin  (VESICARE ) 5 MG tablet Take 1 tablet (5 mg total) by mouth daily. 10/04/23   [name], FNP      Allergies    Bee venom, Latex, Codeine, Hydrocodone, and Oxycodone     Review of Systems   Review of Systems  Physical Exam Updated Vital Signs BP 139/66   Pulse 66   Temp 98.8 F (37.1 C) (Oral)   Resp 18   Ht 5\' 7"  (1.702 m)   Wt 106.2 kg   SpO2 97%   BMI 36.67 kg/m  Physical Exam Vitals reviewed.  Constitutional:      General: She is not in acute distress. Cardiovascular:     Rate and Rhythm: Normal rate.     Pulses: Normal pulses.  Musculoskeletal:     Comments: Right shoulder: Limited range of motion as this does cause pain for the patient passively and actively, 5 out of 5 bilateral elbow flexion/dorsiflexion, grip strength, no clavicle pain or abnormalities, generalized tenderness around shoulder Pain not out of proportion Soft compartments  Skin:    General: Skin is warm and dry.     Capillary Refill: Capillary refill takes less than 2 seconds.  Neurological:     Mental Status: She is alert.     Comments: Sensation intact distally  Psychiatric:        Mood and Affect: Mood normal.     ED Results / Procedures / Treatments   Labs (all labs ordered are listed, but only abnormal results are displayed) Labs Reviewed - No data to display  EKG None  Radiology DG Shoulder Right Result Date: 02/16/2024 CLINICAL DATA:  Right shoulder pain. EXAM: RIGHT SHOULDER - 2+ VIEW COMPARISON:  Right shoulder radiographs 03/02/2022 FINDINGS: Mild glenohumeral joint space  narrowing. Mild-to-moderate peripheral glenoid degenerative osteophytosis. Mild-to-moderate acromioclavicular joint space narrowing and peripheral osteophytosis. No acute fracture is seen. No dislocation. 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 Electronically Signed   By: Bertina Broccoli M.D.   On: 02/16/2024 17:09    Procedures Procedures    Medications Ordered in ED Medications  acetaminophen  (TYLENOL ) 160 MG/5ML solution 650 mg (650 mg Oral Given 02/16/24 1453)    ED Course/ Medical Decision Making/ A&P                                 Medical Decision Making Amount and/or Complexity of Data Reviewed Radiology: ordered.  Risk OTC drugs.   [name] 83 y.o. presented today for right shoulder pain. Working DDx that I considered at this time includes, but not limited to, arthritis, contusion, strain/sprain, fracture, dislocation, neurovascular compromise, septic joint, ischemic limb, compartment syndrome.  R/o DDx: contusion, strain/sprain, fracture, dislocation, neurovascular compromise, septic joint, ischemic limb, compartment syndrome: These are considered less likely due to history of present illness, physical exam, labs/imaging findings.  Review of prior external notes: 11/06/2023 office visit  Unique Tests and My Independent Interpretation:  Right shoulder x-ray: Moderate arthritis noted in the shoulder  Social Determinants of Health: none  Discussion with Independent Historian:  Husband  Discussion of Management of Tests: None  Risk: Medium: prescription drug management  Risk Stratification Score: None  Plan: On exam patient was no acute distress with stable vitals.  On exam patient does have limited range of motion in the right shoulder as this does cause pain.  Patient states her glucose normally runs in the 150s so low suspicion of adhesive capsulitis at this time.  Patient did receive Tylenol  and states that this does  help with the pain.  On my exam patient is neuro vastly intact and has good strength distally.  I do not appreciate deformities or other signs of injury.  Patient did not had any fevers and there is no warmth or overlying erythema indicative of septic arthritis.  I do suspect arthritis at this time and we will get an x-ray.  Will order a sling at patient request  X-ray does confirm arthritis which would explain patient's symptoms.  Will have her follow-up with orthopedics and recommend Tylenol  every 6 as needed pain along with ice and to avoid heat.  I did give the patient information about the [facility] urgent care if symptoms do occur in the meantime to which patient and husband were grateful for.  Patient was given return precautions. Patient stable for discharge at this time.  Patient verbalized understanding of plan.  This chart was dictated using voice recognition software.  Despite best efforts to proofread,  errors can occur  which can change the documentation meaning.        Final Clinical Impression(s) / ED Diagnoses Final diagnoses:  Arthritis of right shoulder    Rx / DC Orders ED Discharge Orders     None         Elex Grimmer 02/16/24 1719    Arvilla Birmingham, MD 02/17/24 [phone]

## 2024-02-18 ENCOUNTER — Telehealth: Payer: Self-pay

## 2024-02-18 NOTE — Transitions of Care (Post Inpatient/ED Visit) (Signed)
 02/18/2024  Name: [name] MRN: [id number] DOB: [birth year]  Today's TOC FU Call Status: Today's TOC FU Call Status:: Successful TOC FU Call Completed TOC FU Call Complete Date: 02/18/24 Patient's Name and Date of Birth confirmed.  Transition Care Management Follow-up Telephone Call Date of Discharge: 02/16/24 Discharge Facility: [facility] ([facility]) Type of Discharge: Emergency Department Reason for ED Visit: Other: (right shoulder pain) How have you been since you were released from the hospital?: Same Any questions or concerns?: Yes Patient Questions/Concerns:: Pt C/O of right shoulder pain Patient Questions/Concerns Addressed: Provided Patient Educational Materials  Items Reviewed: Did you receive and understand the discharge instructions provided?: Yes Medications obtained,verified, and reconciled?: Yes (Medications Reviewed) Any new allergies since your discharge?: No Dietary orders reviewed?: NA Do you have support at home?: Yes People in Home [RPT]: child(ren), adult, significant other Name of Support/Comfort Primary Source: husband and daugthers  Medications Reviewed Today: Medications Reviewed Today     Reviewed by Lonie Roa, CMA (Certified Medical Assistant) on 02/18/24 at 1556  Med List Status: <None>   Medication Order Taking? Sig Documenting Provider Last Dose Status Informant  aspirin  EC 81 MG tablet [id number] Yes Take 81 mg by mouth daily. Swallow whole. [provider] Taking Active Self  Blood Pressure KIT [id number] Yes 1 Units by Does not apply route daily. Check blood pressure daily for hypertension dx code Lauren Ponds, MD Taking Active Self  cyanocobalamin (VITAMIN B12) 1000 MCG tablet [id number] Yes Take 3,000 mcg by mouth daily. [provider] Taking Active   dapagliflozin  propanediol (FARXIGA ) 5 MG TABS tablet [id number] No Take 1 tablet (5 mg total) by mouth daily.  Patient not taking: Reported on 02/18/2024    [name], FNP Not Taking Active   estradiol (ESTRACE) 0.1 MG/GM vaginal cream [id number] Yes Place 1 Applicatorful vaginally 2 (two) times a week. [provider] Taking Active   hydrALAZINE  (APRESOLINE ) 10 MG tablet [id number] Yes Take 10 mg by mouth. [provider] Taking Active   Lancets Emmett Harman DELICA PLUS [place]) MISC [id number] Yes USE TO CHECK BLOOD SUGAR TWICE DAILY Cleave Curling, MD Taking Active Self  lidocaine  (XYLOCAINE ) 2 % solution [id number] Yes Use as directed 15 mLs in the mouth or throat every 6 (six) hours as needed (painful swallowing). Iva Mariner, MD Taking Active   magnesium  30 MG tablet [id number] Yes Take 400 mg by mouth daily. [provider] Taking Active   Multiple Vitamins-Minerals (WOMENS 50+ MULTI VITAMIN) TABS [id number] Yes Take 1 tablet by mouth daily. [provider] Taking Active   Olmesartan -amLODIPine -HCTZ 40-10-12.5 MG TABS [id number] Yes Take 1 tablet by mouth daily. [name], FNP Taking Active   [facility] VERIO test strip [id number] Yes USE TO TEST 2 TIMES DAILY Cleave Curling, MD Taking Active Self  pantoprazole  (PROTONIX ) 40 MG tablet [id number] Yes Take 40 mg by mouth daily. [provider] Taking Active            Med Note Arlin Benes Oct 22, 2023  9:40 AM) As needed  rosuvastatin  (CRESTOR ) 40 MG tablet [id number] Yes Take 1 tablet (40 mg total) by mouth daily. Luna Salinas, MD Taking Active   solifenacin  (VESICARE ) 5 MG tablet [id number] Yes Take 1 tablet (5 mg total) by mouth daily. [name], FNP Taking Active            Med Note Arlin Benes Oct 22, 2023  9:41 AM) As needed  Home Care and Equipment/Supplies: Were Home Health Services Ordered?: NA Any new equipment or medical supplies ordered?: Yes (sling) Name of Medical supply agency?: unsure Were you able to get the equipment/medical supplies?: Yes Do you have any questions related to the use of  the equipment/supplies?: No  Functional Questionnaire: Do you need assistance with bathing/showering or dressing?: No Do you need assistance with meal preparation?: Yes Do you need assistance with eating?: No Do you have difficulty maintaining continence: No Do you need assistance with getting out of bed/getting out of a chair/moving?: Yes Do you have difficulty managing or taking your medications?: No  Follow up appointments reviewed: PCP Follow-up appointment confirmed?: NA Specialist Hospital Follow-up appointment confirmed?: NA (Pt will follow up with [facility]) Do you need transportation to your follow-up appointment?: No Do you understand care options if your condition(s) worsen?: Yes-patient verbalized understanding    SIGNATURE Kirby Peoples, RMA

## 2024-02-20 ENCOUNTER — Ambulatory Visit: Payer: Self-pay

## 2024-02-20 NOTE — Telephone Encounter (Addendum)
 Copied from CRM [id number]. Topic: Clinical - Red Word Triage >> Feb 20, 2024 11:59 AM Cottie Diss B wrote: [facility] that prompted transfer to Nurse Triage: Excruciating pain in both shoulders and left wrist. Shoulders feel like bone on bone pain. Pain started days ago. Had recent ER visit and imaging done for same reason last week. Pain is back. Was prescribed Tylenol  but not helping.  Agent attempted transfer to another nurse, nurse connection went down, agent transferred to this nurse. Nurse speaking to pt.  Chief Complaint: shoulder pain Symptoms: 10+/10 shoulder pain both shoulders, neck pain 7/10, right arm pain 10+, left arm pain 9/10, chest pain between breasts 7/10, shoulders and arms are hot to touch "fever in them," swelling in shoulders, numbness in right hand Frequency: continual Pertinent Negatives: Patient denies SOB, facial drooping, slurred speech, acute confusion, wrist pain today Disposition: [] 911 / [] ED /[] Urgent Care (no appt availability in office) / [] Appointment(In office/virtual)/ []  [facility]/ [] Home Care/ [x] Refused Recommended Disposition /[] [facility]/ [x]  Follow-up with PCP Additional Notes: Pt reporting that she has been experiencing pain in both shoulders, neck, and arms for past 3 weeks but worsening over past 1.5 weeks. Pt reporting she was seen in ED on 4/19 and they told her it was arthritis but pt stating that this is "different type of pain" than the arthritis she's had, pt confirms pain in shoulders, neck, and arms worsening since ED visit. Pt confirms severe pain to both shoulders and arms with "fever in them" hot to touch, swelling in shoulders, numbness in right hand, can't lift her right arm, chest pain 7/10 "between breasts." Pt confirms hx of stroke. Advised pt call 911, pt opposed to 911, advised ED, pt refusing at this time and/or thinking about going to ED, stating that the chest pain can "exercise it and it will go away" and says her  chest is "tight" from sitting all day for work. Advised pt be examined in hospital. Pt requesting call back from [name] FNP. Please advise further recommendations as appropriate. Alerted [facility].  Reason for Disposition  Sounds like a life-threatening emergency to the triager  Answer Assessment - Initial Assessment Questions 1. ONSET: "When did the pain start?"     Last 3 weeks but last 1.5 week worst, can't lift arm, affecting sleep 2. LOCATION: "Where is the pain located?"     Both shoulders, left wrist, arm to elbow right arm and left arm 3. PAIN: "How bad is the pain?" (Scale 1-10; or mild, moderate, severe)   - MILD (1-3): Doesn't interfere with normal activities.   - MODERATE (4-7): Interferes with normal activities (e.g., work or school) or awakens from sleep.   - SEVERE (8-10): Excruciating pain, unable to do any normal activities, unable to hold a cup of water.     Feel like something sticking a knife in both my shoulders 10 or more/10   Pain in wrist not today just feel every now and then 4. WORK OR EXERCISE: "Has there been any recent work or exercise that involved this part of the body?"     no 5. 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 OTHER SYMPTOMS: "Do you have any other symptoms?" (e.g., neck pain, swelling, rash, fever, numbness, weakness)     Neck pain worse, came  with the other pain, right arm hurting, left arm not as bad but enough to keep awake at night, chest pain  Protocols used: Arm Pain-A-AH

## 2024-02-20 NOTE — Telephone Encounter (Signed)
 Called Pt to schedule ED follow up regarding shoulder pain; left VM for call back.

## 2024-02-20 NOTE — Telephone Encounter (Signed)
 Agent was in the process of transferring patient to Nurse Triage. This RN's system went down and lost connection. Agent was able to transfer patient to another RN with Nurse triage.  Copied from CRM [id number]. Topic: Clinical - Red Word Triage >> Feb 20, 2024 11:59 AM [name] wrote: [facility] that prompted transfer to Nurse Triage: Excruciating pain in both shoulders and left wrist. Shoulders feel like bone on bone pain. Pain started days ago. Had recent ER visit and imaging done for same reason last week. Pain is back. Was prescribed Tylenol  but not helping.

## 2024-02-21 NOTE — Telephone Encounter (Signed)
 Noted.

## 2024-03-03 ENCOUNTER — Encounter (HOSPITAL_COMMUNITY): Payer: Self-pay

## 2024-03-03 ENCOUNTER — Emergency Department (HOSPITAL_COMMUNITY)

## 2024-03-03 ENCOUNTER — Observation Stay (HOSPITAL_BASED_OUTPATIENT_CLINIC_OR_DEPARTMENT_OTHER)

## 2024-03-03 ENCOUNTER — Inpatient Hospital Stay (HOSPITAL_COMMUNITY)
Admission: EM | Admit: 2024-03-03 | Discharge: 2024-03-08 | DRG: 035 | Disposition: A | Discharge status: Home / Self Care | Attending: Internal Medicine | Admitting: Internal Medicine

## 2024-03-03 ENCOUNTER — Other Ambulatory Visit: Payer: Self-pay

## 2024-03-03 DIAGNOSIS — R509 Fever, unspecified: Secondary | ICD-10-CM

## 2024-03-03 DIAGNOSIS — E785 Hyperlipidemia, unspecified: Secondary | ICD-10-CM | POA: Diagnosis present

## 2024-03-03 DIAGNOSIS — I447 Left bundle-branch block, unspecified: Secondary | ICD-10-CM | POA: Diagnosis present

## 2024-03-03 DIAGNOSIS — E1169 Type 2 diabetes mellitus with other specified complication: Secondary | ICD-10-CM | POA: Diagnosis present

## 2024-03-03 DIAGNOSIS — I350 Nonrheumatic aortic (valve) stenosis: Secondary | ICD-10-CM | POA: Diagnosis present

## 2024-03-03 DIAGNOSIS — I6522 Occlusion and stenosis of left carotid artery: Secondary | ICD-10-CM | POA: Diagnosis not present

## 2024-03-03 DIAGNOSIS — I10 Essential (primary) hypertension: Secondary | ICD-10-CM | POA: Diagnosis not present

## 2024-03-03 DIAGNOSIS — Z79899 Other long term (current) drug therapy: Secondary | ICD-10-CM

## 2024-03-03 DIAGNOSIS — R7989 Other specified abnormal findings of blood chemistry: Secondary | ICD-10-CM | POA: Diagnosis present

## 2024-03-03 DIAGNOSIS — Z8249 Family history of ischemic heart disease and other diseases of the circulatory system: Secondary | ICD-10-CM

## 2024-03-03 DIAGNOSIS — Z87440 Personal history of urinary (tract) infections: Secondary | ICD-10-CM

## 2024-03-03 DIAGNOSIS — N3000 Acute cystitis without hematuria: Secondary | ICD-10-CM | POA: Diagnosis present

## 2024-03-03 DIAGNOSIS — Z713 Dietary counseling and surveillance: Secondary | ICD-10-CM

## 2024-03-03 DIAGNOSIS — Z860100 Personal history of colon polyps, unspecified: Secondary | ICD-10-CM

## 2024-03-03 DIAGNOSIS — I129 Hypertensive chronic kidney disease with stage 1 through stage 4 chronic kidney disease, or unspecified chronic kidney disease: Secondary | ICD-10-CM | POA: Diagnosis present

## 2024-03-03 DIAGNOSIS — K219 Gastro-esophageal reflux disease without esophagitis: Secondary | ICD-10-CM | POA: Diagnosis present

## 2024-03-03 DIAGNOSIS — Z006 Encounter for examination for normal comparison and control in clinical research program: Secondary | ICD-10-CM

## 2024-03-03 DIAGNOSIS — R4701 Aphasia: Secondary | ICD-10-CM | POA: Diagnosis not present

## 2024-03-03 DIAGNOSIS — I959 Hypotension, unspecified: Secondary | ICD-10-CM | POA: Diagnosis not present

## 2024-03-03 DIAGNOSIS — E66812 Obesity, class 2: Secondary | ICD-10-CM | POA: Diagnosis present

## 2024-03-03 DIAGNOSIS — Z79890 Hormone replacement therapy (postmenopausal): Secondary | ICD-10-CM

## 2024-03-03 DIAGNOSIS — R350 Frequency of micturition: Principal | ICD-10-CM

## 2024-03-03 DIAGNOSIS — Z96653 Presence of artificial knee joint, bilateral: Secondary | ICD-10-CM | POA: Diagnosis present

## 2024-03-03 DIAGNOSIS — Z7982 Long term (current) use of aspirin: Secondary | ICD-10-CM

## 2024-03-03 DIAGNOSIS — Z6838 Body mass index (BMI) 38.0-38.9, adult: Secondary | ICD-10-CM

## 2024-03-03 DIAGNOSIS — I700 Atherosclerosis of aorta: Secondary | ICD-10-CM | POA: Diagnosis present

## 2024-03-03 DIAGNOSIS — Z91040 Latex allergy status: Secondary | ICD-10-CM

## 2024-03-03 DIAGNOSIS — Z1152 Encounter for screening for COVID-19: Secondary | ICD-10-CM

## 2024-03-03 DIAGNOSIS — R1310 Dysphagia, unspecified: Secondary | ICD-10-CM | POA: Diagnosis not present

## 2024-03-03 DIAGNOSIS — E441 Mild protein-calorie malnutrition: Secondary | ICD-10-CM | POA: Diagnosis present

## 2024-03-03 DIAGNOSIS — I493 Ventricular premature depolarization: Secondary | ICD-10-CM

## 2024-03-03 DIAGNOSIS — Z90722 Acquired absence of ovaries, bilateral: Secondary | ICD-10-CM

## 2024-03-03 DIAGNOSIS — I214 Non-ST elevation (NSTEMI) myocardial infarction: Secondary | ICD-10-CM

## 2024-03-03 DIAGNOSIS — Z8673 Personal history of transient ischemic attack (TIA), and cerebral infarction without residual deficits: Secondary | ICD-10-CM

## 2024-03-03 DIAGNOSIS — R17 Unspecified jaundice: Secondary | ICD-10-CM | POA: Diagnosis present

## 2024-03-03 DIAGNOSIS — E1122 Type 2 diabetes mellitus with diabetic chronic kidney disease: Secondary | ICD-10-CM | POA: Diagnosis present

## 2024-03-03 DIAGNOSIS — G4733 Obstructive sleep apnea (adult) (pediatric): Secondary | ICD-10-CM

## 2024-03-03 DIAGNOSIS — I2510 Atherosclerotic heart disease of native coronary artery without angina pectoris: Secondary | ICD-10-CM | POA: Diagnosis present

## 2024-03-03 DIAGNOSIS — Z7952 Long term (current) use of systemic steroids: Secondary | ICD-10-CM

## 2024-03-03 DIAGNOSIS — Z9049 Acquired absence of other specified parts of digestive tract: Secondary | ICD-10-CM

## 2024-03-03 DIAGNOSIS — R7401 Elevation of levels of liver transaminase levels: Secondary | ICD-10-CM | POA: Diagnosis present

## 2024-03-03 DIAGNOSIS — Z90710 Acquired absence of both cervix and uterus: Secondary | ICD-10-CM

## 2024-03-03 DIAGNOSIS — Z885 Allergy status to narcotic agent status: Secondary | ICD-10-CM

## 2024-03-03 DIAGNOSIS — Z7902 Long term (current) use of antithrombotics/antiplatelets: Secondary | ICD-10-CM

## 2024-03-03 DIAGNOSIS — Z91030 Bee allergy status: Secondary | ICD-10-CM

## 2024-03-03 DIAGNOSIS — G459 Transient cerebral ischemic attack, unspecified: Secondary | ICD-10-CM

## 2024-03-03 DIAGNOSIS — E1165 Type 2 diabetes mellitus with hyperglycemia: Secondary | ICD-10-CM | POA: Diagnosis present

## 2024-03-03 DIAGNOSIS — R300 Dysuria: Secondary | ICD-10-CM

## 2024-03-03 DIAGNOSIS — M109 Gout, unspecified: Secondary | ICD-10-CM | POA: Diagnosis present

## 2024-03-03 DIAGNOSIS — I2489 Other forms of acute ischemic heart disease: Secondary | ICD-10-CM | POA: Diagnosis present

## 2024-03-03 DIAGNOSIS — N1831 Chronic kidney disease, stage 3a: Secondary | ICD-10-CM | POA: Diagnosis present

## 2024-03-03 LAB — COMPREHENSIVE METABOLIC PANEL WITH GFR
ALT: 52 U/L — ABNORMAL HIGH (ref 0–44)
AST: 54 U/L — ABNORMAL HIGH (ref 15–41)
Albumin: 3.1 g/dL — ABNORMAL LOW (ref 3.5–5.0)
Alkaline Phosphatase: 41 U/L (ref 38–126)
Anion gap: 10 (ref 5–15)
BUN: 30 mg/dL — ABNORMAL HIGH (ref 8–23)
CO2: 22 mmol/L (ref 22–32)
Calcium: 8.3 mg/dL — ABNORMAL LOW (ref 8.9–10.3)
Chloride: 102 mmol/L (ref 98–111)
Creatinine, Ser: 1.09 mg/dL — ABNORMAL HIGH (ref 0.44–1.00)
GFR, Estimated: 51 mL/min — ABNORMAL LOW (ref >60)
Glucose, Bld: 198 mg/dL — ABNORMAL HIGH (ref 70–99)
Potassium: 4.1 mmol/L (ref 3.5–5.1)
Sodium: 134 mmol/L — ABNORMAL LOW (ref 135–145)
Total Bilirubin: 1.6 mg/dL — ABNORMAL HIGH (ref 0.0–1.2)
Total Protein: 6.5 g/dL (ref 6.5–8.1)

## 2024-03-03 LAB — CBC WITH DIFFERENTIAL/PLATELET
Abs Immature Granulocytes: 0.12 10*3/uL — ABNORMAL HIGH (ref 0.00–0.07)
Basophils Absolute: 0 10*3/uL (ref 0.0–0.1)
Basophils Relative: 0 %
Eosinophils Absolute: 0.4 10*3/uL (ref 0.0–0.5)
Eosinophils Relative: 3 %
HCT: 39.9 % (ref 36.0–46.0)
Hemoglobin: 13.2 g/dL (ref 12.0–15.0)
Immature Granulocytes: 1 %
Lymphocytes Relative: 5 %
Lymphs Abs: 0.6 10*3/uL — ABNORMAL LOW (ref 0.7–4.0)
MCH: 29.4 pg (ref 26.0–34.0)
MCHC: 33.1 g/dL (ref 30.0–36.0)
MCV: 88.9 fL (ref 80.0–100.0)
Monocytes Absolute: 0.7 10*3/uL (ref 0.1–1.0)
Monocytes Relative: 5 %
Neutro Abs: 10.4 10*3/uL — ABNORMAL HIGH (ref 1.7–7.7)
Neutrophils Relative %: 86 %
Platelets: 181 10*3/uL (ref 150–400)
RBC: 4.49 MIL/uL (ref 3.87–5.11)
RDW: 13.7 % (ref 11.5–15.5)
WBC: 12.1 10*3/uL — ABNORMAL HIGH (ref 4.0–10.5)
nRBC: 0 % (ref 0.0–0.2)

## 2024-03-03 LAB — URINALYSIS, W/ REFLEX TO CULTURE (INFECTION SUSPECTED)
Bacteria, UA: NONE SEEN
Bilirubin Urine: NEGATIVE
Glucose, UA: NEGATIVE mg/dL
Ketones, ur: NEGATIVE mg/dL
Leukocytes,Ua: NEGATIVE
Nitrite: NEGATIVE
Protein, ur: NEGATIVE mg/dL
Specific Gravity, Urine: 1.012 (ref 1.005–1.030)
pH: 5 (ref 5.0–8.0)

## 2024-03-03 LAB — ECHOCARDIOGRAM COMPLETE
AR max vel: 1.41 cm2
AV Area VTI: 1.35 cm2
AV Area mean vel: 1.45 cm2
AV Mean grad: 23 mmHg
AV Peak grad: 36.7 mmHg
Ao pk vel: 3.03 m/s
Area-P 1/2: 4.96 cm2
MV VTI: 2.24 cm2
S' Lateral: 3.18 cm

## 2024-03-03 LAB — RESP PANEL BY RT-PCR (RSV, FLU A&B, COVID)  RVPGX2
Influenza A by PCR: NEGATIVE
Influenza B by PCR: NEGATIVE
Resp Syncytial Virus by PCR: NEGATIVE
SARS Coronavirus 2 by RT PCR: NEGATIVE

## 2024-03-03 LAB — TROPONIN I (HIGH SENSITIVITY)
Troponin I (High Sensitivity): 294 ng/L (ref <18)
Troponin I (High Sensitivity): 224 ng/L (ref <18)
Troponin I (High Sensitivity): 164 ng/L (ref <18)

## 2024-03-03 LAB — I-STAT CG4 LACTIC ACID, ED
Lactic Acid, Venous: 1.3 mmol/L (ref 0.5–1.9)
Lactic Acid, Venous: 0.9 mmol/L (ref 0.5–1.9)

## 2024-03-03 LAB — GLUCOSE, CAPILLARY
Glucose-Capillary: 168 mg/dL — ABNORMAL HIGH (ref 70–99)
Glucose-Capillary: 154 mg/dL — ABNORMAL HIGH (ref 70–99)
Glucose-Capillary: 165 mg/dL — ABNORMAL HIGH (ref 70–99)

## 2024-03-03 LAB — MAGNESIUM: Magnesium: 1.7 mg/dL (ref 1.7–2.4)

## 2024-03-03 LAB — CBG MONITORING, ED: Glucose-Capillary: 151 mg/dL — ABNORMAL HIGH (ref 70–99)

## 2024-03-03 MED ORDER — ACETAMINOPHEN 650 MG RE SUPP: 650.0000 mg | Freq: Four times a day (QID) | RECTAL | Status: DC | PRN

## 2024-03-03 MED ORDER — OLMESARTAN-AMLODIPINE-HCTZ 40-10-12.5 MG PO TABS: 1.0000 | ORAL_TABLET | Freq: Every day | ORAL | Status: DC

## 2024-03-03 MED ORDER — ROSUVASTATIN CALCIUM 20 MG PO TABS
40.0000 mg | ORAL_TABLET | Freq: Every day | ORAL | Status: DC
  Administered 2024-03-03 – 2024-03-08 (×5): 40 mg via ORAL
  Filled 2024-03-03 (×5): qty 2

## 2024-03-03 MED ORDER — PERFLUTREN LIPID MICROSPHERE
1.0000 mL | INTRAVENOUS | Status: AC | PRN
  Administered 2024-03-03: 3 mL via INTRAVENOUS

## 2024-03-03 MED ORDER — SODIUM CHLORIDE 0.9 % IV SOLN
1.0000 g | Freq: Once | INTRAVENOUS | Status: AC
  Administered 2024-03-03: 1 g via INTRAVENOUS
  Filled 2024-03-03: qty 10

## 2024-03-03 MED ORDER — ORAL CARE MOUTH RINSE: 15.0000 mL | OROMUCOSAL | Status: DC | PRN

## 2024-03-03 MED ORDER — MAGNESIUM SULFATE 2 GM/50ML IV SOLN
2.0000 g | Freq: Once | INTRAVENOUS | Status: AC
  Administered 2024-03-03: 2 g via INTRAVENOUS
  Filled 2024-03-03: qty 50

## 2024-03-03 MED ORDER — LACTATED RINGERS IV SOLN: INTRAVENOUS | Status: AC

## 2024-03-03 MED ORDER — SODIUM CHLORIDE 0.9 % IV SOLN: 1.0000 g | INTRAVENOUS | Status: DC

## 2024-03-03 MED ORDER — METOPROLOL TARTRATE 25 MG PO TABS
25.0000 mg | ORAL_TABLET | Freq: Four times a day (QID) | ORAL | Status: DC
  Administered 2024-03-03 (×2): 25 mg via ORAL
  Filled 2024-03-03 (×2): qty 1

## 2024-03-03 MED ORDER — ACETAMINOPHEN 325 MG PO TABS
650.0000 mg | ORAL_TABLET | Freq: Four times a day (QID) | ORAL | Status: DC | PRN
  Administered 2024-03-03 – 2024-03-07 (×3): 650 mg via ORAL
  Filled 2024-03-03 (×3): qty 2

## 2024-03-03 MED ORDER — ACETAMINOPHEN 325 MG PO TABS
650.0000 mg | ORAL_TABLET | Freq: Once | ORAL | Status: AC
  Administered 2024-03-03: 650 mg via ORAL
  Filled 2024-03-03: qty 2

## 2024-03-03 MED ORDER — SODIUM CHLORIDE 0.9 % IV SOLN
2.0000 g | INTRAVENOUS | Status: DC
  Administered 2024-03-04 – 2024-03-08 (×5): 2 g via INTRAVENOUS
  Filled 2024-03-03 (×5): qty 20

## 2024-03-03 MED ORDER — INSULIN ASPART 100 UNIT/ML IJ SOLN
0.0000 [IU] | Freq: Three times a day (TID) | INTRAMUSCULAR | Status: DC
  Administered 2024-03-03 – 2024-03-04 (×5): 3 [IU] via SUBCUTANEOUS
  Administered 2024-03-04: 2 [IU] via SUBCUTANEOUS
  Administered 2024-03-05: 5 [IU] via SUBCUTANEOUS
  Administered 2024-03-05 – 2024-03-06 (×3): 3 [IU] via SUBCUTANEOUS
  Administered 2024-03-07 – 2024-03-08 (×2): 2 [IU] via SUBCUTANEOUS
  Filled 2024-03-03: qty 0.15

## 2024-03-03 MED ORDER — SODIUM CHLORIDE 0.9 % IV BOLUS
1000.0000 mL | Freq: Once | INTRAVENOUS | Status: AC
  Administered 2024-03-03: 1000 mL via INTRAVENOUS

## 2024-03-03 MED ORDER — HYDROCHLOROTHIAZIDE 12.5 MG PO TABS
12.5000 mg | ORAL_TABLET | Freq: Every day | ORAL | Status: DC
  Administered 2024-03-03 – 2024-03-04 (×2): 12.5 mg via ORAL
  Filled 2024-03-03 (×2): qty 1

## 2024-03-03 MED ORDER — IRBESARTAN 300 MG PO TABS
300.0000 mg | ORAL_TABLET | Freq: Every day | ORAL | Status: DC
  Administered 2024-03-03 – 2024-03-04 (×2): 300 mg via ORAL
  Filled 2024-03-03 (×2): qty 1

## 2024-03-03 MED ORDER — METOPROLOL TARTRATE 25 MG PO TABS
25.0000 mg | ORAL_TABLET | Freq: Two times a day (BID) | ORAL | Status: DC
  Administered 2024-03-03: 25 mg via ORAL
  Filled 2024-03-03: qty 1

## 2024-03-03 MED ORDER — AMLODIPINE BESYLATE 5 MG PO TABS
10.0000 mg | ORAL_TABLET | Freq: Every day | ORAL | Status: DC
Start: 2024-03-03 — End: 2024-03-04
  Administered 2024-03-03 – 2024-03-04 (×2): 10 mg via ORAL
  Filled 2024-03-03 (×2): qty 2

## 2024-03-03 NOTE — ED Triage Notes (Signed)
 Pt. Arrives for urinary frequency and painful urination. Pt's husband states that she has been more disoriented than usual and has had chills.

## 2024-03-03 NOTE — Progress Notes (Signed)
  Carryover admission to the Day Admitter.  I discussed this case with the EDP, Knute Perla, PA.  Per these discussions:   This is a 83 year old female that is being admitted for urinary tract infection, suspect to be refractory to outpatient antibiotics.   She recently developed dysuria as well as increase in urinary urgency, prompting outpatient urinalysis that was reported to be consistent with UTI.  Subsequently, her PCP started her on Macrobid  a few days ago.  However, in spite of good interval compliance with Macrobid , has noted no significant improvement in her dysuria or urinary urgency.  She now is experiencing some generalized weakness in the absence of any acute focal weakness, along with subjective fever, with temperature max in the ED this evening noted to be 100.2.  She also has a leukocytosis on presenting CBC.  She received a dose of Rocephin  in the ED this evening.  Chest x-ray showed no evidence of acute cardiopulmonary process, will COVID, flu, and RSV PCR were all negative.  Presenting troponin noted to be 300, relative to most recent prior value of 9 when checked in November 2024.  The patient has no recent shortness of breath or any chest pain, and EKG shows no evidence of acute ischemic changes.  Repeat troponin level is currently pending.  EDP d/w on-call cardiology, Dr. Jolan Natal, conveyed that it is okay for the patient to remain at [facility].  He recommends further trending troponin, echocardiogram.  If troponin subsequently shows significant further increase, then cardiology will formally consult today.   I have placed an order for observation for further evaluation management of the above.  I have placed some additional preliminary admit orders via the adult multi-morbid admission order set. I have also continued the Rocephin  that was started in the ED this evening, added on a magnesium  level, ordered a repeat troponin to be checked around 8 AM this morning, along with  echocardiogram.    Camelia Cavalier, DO Hospitalist

## 2024-03-03 NOTE — Consult Note (Addendum)
 Cardiology Consultation   Patient ID: [name] MRN: [id number]; DOB: 09-27-1941  Admit date: 03/03/2024 Date of Consult: 03/03/2024  PCP:  [name], FNP   [facility] Providers Cardiologist:  Dr. Christean Courts of [facility]   Patient Profile:   [name] is a 83 y.o. 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 Racheal Buddle.  History of Present Illness:   [name] is a pleasant 83 year old female with past medical history of HTN, HLD, DM2, history of CVA and intracranial hemorrhage 09/2022, carotid artery disease, LBBB and coronary artery calcification.  Myoview  obtained on 08/10/2017 showed EF 50%, low risk study with normal perfusion.  Echocardiogram obtained in June 2023 and also April 2024 showed EF 50 to 55%, no regional wall motion abnormality, severe mitral annular calcification with no evidence of mitral stenosis or mitral regurgitation, mild aortic stenosis.  Patient was last seen by Dr. Maudine Sos in November 2022.  She has since established with Dr. [name] of [facility].  I am unable to see her record from [facility] medical.  She denies any prior cardiac catheterization or recent stress testing.  She was in her usual state of health until about a month ago.  She had increased fatigue in the past month.  She also mention increased urinary frequency.  This apparently is not a new issue but worsened recently.  Per her report, she had part of her colon resected many years ago, however surgery was complicated as her ureter was cut.  Although it was repaired, she has since noticed she has increased urination when she tried to lay down.  She denies any recent chest pain or shortness of breath.  She does have a cough.  Due to increased urinary frequency, she  sought medical attention at [facility] emergency room this morning.  On arrival, she had a low-grade fever of 100.2.  Blood pressure was 152/69.  O2 saturation 97%.  Lactic acid was normal.  Significant blood work included sodium 134, potassium 4.1, creatinine 1.09.  AST ALT around 50.  White blood cell count 12.1.  Hemoglobin normal.  Viral panel negative for flu, COVID or RSV.  Blood culture collected and currently pending.  Chest x-ray normal.  EKG showed normal sinus rhythm with PVC.  Telemetry showed frequent PVCs and bigeminy.  Serial troponin 294--> 224.  Cardiology service consulted for elevated troponin.  Urinalysis showed small hemoglobin, negative nitrite, negative leukocyte.  Patient received 2 doses of IV Rocephin  this morning.  Cardiology service was consulted for elevated troponin.   Past Medical History:  Diagnosis Date   Abdominal bloating 02/17/2019   Abdominal pain    AKI (acute kidney injury) (HCC) 02/12/2023   Arthritis    cervical disc degeneration/ oa left knee, carpal tunnel rt wrist, adhesive capsulitis right shoulder, rt hand weakness; lumbar degeneration   Carpal tunnel syndrome    CARPAL TUNNEL SYNDROME, RIGHT 10/14/2010   Qualifier: Diagnosis of   By: Nasario Badder MD, Sallyanne Creamer        CHEST PAIN, ATYPICAL 07/01/2010   Qualifier: Diagnosis of   By: Rodrick Clapper MD, Jackqulyn Masse acquired pneumonia 05/06/2020   Complication of anesthesia 2006-at [facility]   breathing problems-no BP med given prior to surgery;  hx of  being very sleepy after colon surgery --  states no problems with last right total knee replacement 2013   Constipation    Diabetes mellitus without complication (HCC)    borderline - diet control   Diverticulitis hx of   Diverticulosis    E. coli infection    2020   Frequent UTI    hx of urethral injury during colon surgery - states frequent uti's since   GERD (gastroesophageal reflux disease)    Gout, unspecified 09/09/2007   Qualifier: Diagnosis of    By: Larrie Po MD, Wilmon Hashimoto        H. pylori infection 07/18/2010   H/O hiatal hernia    HIP PAIN, LEFT, CHRONIC 11/09/2010   Qualifier: Diagnosis of   By: Larrie Po MD, Wilmon Hashimoto        History of colon polyps 08/26/2008   History of palpitations    in the past   History of shingles    has a lingering itching on back where shingles were   Hyperlipidemia    Hypertension    Hypokalemia 10/19/2022   KNEE PAIN, RIGHT, CHRONIC 10/14/2010   Qualifier: Diagnosis of   By: Nasario Badder MD, Sallyanne Creamer        Lightheaded 02/17/2019   Morbidly obese (HCC) 05/31/2011   bmi of 44     Numbness and tingling in right hand    pt. 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   Shortness of breath 09/14/2021   Shortness of breath 09/14/2021   Spasm of lumbar paraspinous muscle 06/17/2011   Stroke (HCC)    Stroke-like symptom    Subacute intracranial hemorrhage (HCC) 10/18/2022   Transient speech disturbance 04/18/2022   Urinary frequency 02/17/2019   Vaginal discharge 08/02/2011   Vitamin D  deficiency     Past Surgical History:  Procedure Laterality Date   ABDOMINAL HYSTERECTOMY     bladder tack     BREAST BIOPSY Left    BREAST SURGERY     breast duct resection- benign   CARPAL TUNNEL RELEASE Right 06/04/2014   Procedure: RIGHT CARPAL TUNNEL RELEASE;  Surgeon: Ronn Cohn, MD;  Location: [facility];  Service: Orthopedics;  Laterality: Right;   COLON RESECTION  2008   hx diverticulosis   COLON SURGERY     COLONOSCOPY     colonoscopy [id number]     JOINT REPLACEMENT     OOPHORECTOMY     POLYPECTOMY     skin graft left arm - traumatic compression injury left upper arm     temporary ureter stent      TOTAL KNEE ARTHROPLASTY  01/05/2012   Procedure: TOTAL KNEE ARTHROPLASTY;  Surgeon: Aurther Blue, MD;  Location: [facility];  Service: Orthopedics;  Laterality: Right;   TOTAL KNEE ARTHROPLASTY Left 08/17/2014   Procedure: LEFT TOTAL KNEE ARTHROPLASTY;  Surgeon: Aurther Blue, MD;  Location: [facility];  Service: Orthopedics;  Laterality: Left;   ureter repair for tyransected left ureter       Home Medications:  Prior to Admission medications   Medication Sig Start Date End Date Taking? Authorizing Provider  aspirin  EC 81 MG tablet Take 81  mg by mouth daily. Swallow whole.   Yes [provider]  Blood Pressure KIT 1 Units by Does not apply route daily. Check blood pressure daily for hypertension dx code I10 09/14/21  Yes Maudine Sos, MD  Lancets [facility] DELICA PLUS [place]) MISC USE TO CHECK BLOOD SUGAR TWICE DAILY 12/24/20  Yes Cleave Curling, MD  nitrofurantoin , macrocrystal-monohydrate, (MACROBID ) 100 MG capsule Take 100 mg by mouth 7 days.   Yes [provider]  Olmesartan -amLODIPine -HCTZ 40-10-12.5 MG TABS Take 1 tablet by mouth daily. 01/01/24  Yes [name], FNP  predniSONE  (DELTASONE ) 10 MG tablet Take 10 mg by mouth as directed.   Yes [provider]  rosuvastatin  (CRESTOR ) 40 MG tablet Take 1 tablet (40 mg total) by mouth daily. 02/13/23  Yes Amin, Sumayya, MD  cyanocobalamin (VITAMIN B12) 1000 MCG tablet Take 3,000 mcg by mouth daily. Patient not taking: Reported on 03/03/2024    [provider]  dapagliflozin  propanediol (FARXIGA ) 5 MG TABS tablet Take 1 tablet (5 mg total) by mouth daily. Patient not taking: Reported on 12/18/2023 10/22/23   [name], FNP  estradiol (ESTRACE) 0.1 MG/GM vaginal cream Place 1 Applicatorful vaginally 2 (two) times a week. Patient not taking: Reported on 03/03/2024 11/06/23   [provider]  hydrALAZINE  (APRESOLINE ) 10 MG tablet Take 10 mg by mouth. Patient not taking: Reported on 03/03/2024     [provider]  lidocaine  (XYLOCAINE ) 2 % solution Use as directed 15 mLs in the mouth or throat every 6 (six) hours as needed (painful swallowing). Patient not taking: Reported on 03/03/2024 12/15/23   Iva Mariner, MD  magnesium  30 MG tablet Take 400 mg by mouth daily. Patient not taking: Reported on 03/03/2024    [provider]  Multiple Vitamins-Minerals (WOMENS 50+ MULTI VITAMIN) TABS Take 1 tablet by mouth daily. Patient not taking: Reported on 03/03/2024 08/24/21   [provider]  Emmett Harman VERIO test strip USE TO TEST 2 TIMES DAILY Patient not taking: Reported on 03/03/2024 12/20/20   Cleave Curling, MD  pantoprazole  (PROTONIX ) 40 MG tablet Take 40 mg by mouth daily. Patient not taking: Reported on 03/03/2024 06/02/23   [provider]  solifenacin  (VESICARE ) 5 MG tablet Take 1 tablet (5 mg total) by mouth daily. Patient not taking: Reported on 03/03/2024 10/04/23   [name], FNP    Inpatient Medications: Scheduled Meds:  irbesartan   300 mg Oral Daily   And   amLODipine   10 mg Oral Daily   And   hydrochlorothiazide  12.5 mg Oral Daily   insulin  aspart  0-15 Units Subcutaneous TID WC   rosuvastatin   40 mg Oral Daily   Continuous Infusions:  [START ON 03/04/2024] cefTRIAXone  (ROCEPHIN )  IV     lactated ringers  75 mL/hr at 03/03/24 0612   PRN Meds: acetaminophen  **OR** acetaminophen , perflutren  lipid microspheres (DEFINITY ) IV suspension  Allergies:    Allergies  Allergen Reactions   Bee Venom Hives   Latex Hives and Itching   Codeine Other (See Comments)    Hallucinations    Hydrocodone Other (See Comments)    hallucinations   Oxycodone  Other (See Comments)    hallucinations    Social History:   Social History   Socioeconomic History   Marital status: Married    Spouse name: Not on file   Number of children: 6   Years of education: Not on file   Highest education level: Master's degree (e.g., MA, MS, MEng, MEd, MSW, MBA)   Occupational History  Occupation: Copywriter, advertising: [employer]  Tobacco Use   Smoking status: Never   Smokeless tobacco: Never  Vaping Use   Vaping status: Never Used  Substance and Sexual Activity   Alcohol use: No   Drug use: No   Sexual activity: Not Currently  Other Topics Concern   Not on file  Social History Narrative   The patient is married and has 6 children   Operates a business   No alcohol tobacco or drug use   Social Drivers of Corporate investment banker Strain: Low Risk  (10/02/2023)   Overall Financial Resource Strain (CARDIA)    Difficulty of Paying Living Expenses: Not hard at all  Food Insecurity: Low Risk  (11/06/2023)   Received from [facility]   Hunger Vital Sign    Worried About Running Out of Food in the Last Year: Never true    Ran Out of Food in the Last Year: Never true  Transportation Needs: No Transportation Needs (11/06/2023)   Received from [facility]    In the past 12 months, has lack of reliable transportation kept you from medical appointments, meetings, work or from getting things needed for daily living? : No  Physical Activity: Inactive (10/02/2023)   Exercise Vital Sign    Days of Exercise per Week: 0 days    Minutes of Exercise per Session: 0 min  Stress: No Stress Concern Present (10/02/2023)   [employer] of Occupational Health - Occupational Stress Questionnaire    Feeling of Stress : Only a little  Recent Concern: Stress - Stress Concern Present (09/19/2023)   [employer] of Occupational Health - Occupational Stress Questionnaire    Feeling of Stress : To some extent  Social Connections: Moderately Integrated (10/02/2023)   Social Connection and Isolation Panel [NHANES]    Frequency of Communication with Friends and Family: Once a week    Frequency of Social Gatherings with Friends and Family: Once a week    Attends Religious Services: More than 4 times per year    Active Member of [employer] or  Organizations: No    Attends Engineer, structural: More than 4 times per year    Marital Status: Married  Catering manager Violence: Not At Risk (09/25/2023)   Humiliation, Afraid, Rape, and Kick questionnaire    Fear of Current or Ex-Partner: No    Emotionally Abused: No    Physically Abused: No    Sexually Abused: No    Family History:    Family History  Problem Relation Age of Onset   Breast cancer Mother 7   Hypertension Mother    Prostate cancer Father    Hypertension Father    Dementia Sister    Lung cancer Brother    Hypertension Brother    COPD Brother    Cancer Maternal Grandmother    Arthritis Sister    Hypertension Sister    Arthritis Sister    Hypertension Child    Hypertension Child    Hypertension Child    Colon polyps Neg Hx    Esophageal cancer Neg Hx    Rectal cancer Neg Hx    Stomach cancer Neg Hx    Colon cancer Neg Hx      ROS:  Please see the history of present illness.   All other ROS reviewed and negative.     Physical Exam/Data:   Vitals:   03/03/24 0815 03/03/24 0856 03/03/24 0900 03/03/24 1030  BP: Aaron Aas)  142/60  (!) 153/67 (!) 142/61  Pulse: 84  84 85  Resp: (!) 24  (!) 26 (!) 22  Temp:  99.6 F (37.6 C)    TempSrc:  Oral    SpO2: 97%  95% 96%    Intake/Output Summary (Last 24 hours) at 03/03/2024 1117 Last data filed at 03/03/2024 4696 Gross per 24 hour  Intake 1120 ml  Output 350 ml  Net 770 ml      02/16/2024    1:24 PM 12/18/2023    2:32 PM 12/15/2023    9:40 AM  Last 3 Weights  Weight (lbs) 234 lb 2.1 oz 234 lb 3.2 oz 229 lb  Weight (kg) 106.2 kg 106.232 kg 103.874 kg     There is no height or weight on file to calculate BMI.  General:  Well nourished, well developed, in no acute distress HEENT: normal Neck: no JVD Vascular: No carotid bruits; Distal pulses 2+ bilaterally Cardiac:  normal S1, S2; RRR; no murmur  Lungs:  clear to auscultation bilaterally, no wheezing, rhonchi or rales  Abd: soft, nontender, no  hepatomegaly  Ext: no edema Musculoskeletal:  No deformities, BUE and BLE strength normal and equal Skin: warm and dry  Neuro:  CNs 2-12 intact, no focal abnormalities noted Psych:  Normal affect   EKG:  The EKG was personally reviewed and demonstrates: Normal sinus rhythm, left bundle branch block, occasional PVC Telemetry:  Telemetry was personally reviewed and demonstrates: Normal sinus rhythm, frequent PVCs on telemetry and occasional bigeminy  Relevant CV Studies:  Echo 02/12/2023  1. Left ventricular ejection fraction, by estimation, is 50 to 55%. The  left ventricle has low normal function. The left ventricle has no regional  wall motion abnormalities. Left ventricular diastolic function could not  be evaluated.   2. Right ventricular systolic function is normal. The right ventricular  size is normal. Tricuspid regurgitation signal is inadequate for assessing  PA pressure.   3. The mitral valve is normal in structure. No evidence of mitral valve  regurgitation. No evidence of mitral stenosis. Severe mitral annular  calcification.   4. The aortic valve is calcified. Aortic valve regurgitation is not  visualized. Mild aortic valve stenosis. Aortic valve area, by VTI measures  1.69 cm. Aortic valve mean gradient measures 14.0 mmHg. Aortic valve Vmax  measures 2.58 m/s.   5. The inferior vena cava is normal in size with greater than 50%  respiratory variability, suggesting right atrial pressure of 3 mmHg.   Laboratory Data:  High Sensitivity Troponin:   Recent Labs  Lab 03/03/24 0348 03/03/24 0600  TROPONINIHS 294* 224*     Chemistry Recent Labs  Lab 03/03/24 0345 03/03/24 0600  NA 134*  --   K 4.1  --   CL 102  --   CO2 22  --   GLUCOSE 198*  --   BUN 30*  --   CREATININE 1.09*  --   CALCIUM  8.3*  --   MG  --  1.7  GFRNONAA 51*  --   ANIONGAP 10  --     Recent Labs  Lab 03/03/24 0345  PROT 6.5  ALBUMIN 3.1*  AST 54*  ALT 52*  ALKPHOS 41  BILITOT  1.6*   Lipids No results for input(s): "CHOL", "TRIG", "HDL", "LABVLDL", "LDLCALC", "CHOLHDL" in the last 168 hours.  Hematology Recent Labs  Lab 03/03/24 0345  WBC 12.1*  RBC 4.49  HGB 13.2  HCT 39.9  MCV 88.9  MCH 29.4  MCHC 33.1  RDW 13.7  PLT 181   Thyroid  No results for input(s): "TSH", "FREET4" in the last 168 hours.  BNPNo results for input(s): "BNP", "PROBNP" in the last 168 hours.  DDimer No results for input(s): "DDIMER" in the last 168 hours.   Radiology/Studies:  DG Chest Port 1 View Result Date: 03/03/2024 CLINICAL DATA:  Cough and fever EXAM: PORTABLE CHEST 1 VIEW COMPARISON:  12/15/2023 FINDINGS: Cardiac shadow is stable. Elevation the right hemidiaphragm is again seen and stable. No focal infiltrate or effusion is noted. No bony abnormality is noted. IMPRESSION: No active disease. Electronically Signed   By: Violeta Grey M.D.   On: 03/03/2024 03:21     Assessment and Plan:   NSTEMI  - Unclear if type I versus type II.  Serial enzyme 294--> 224.  Patient denies any chest pain.  EKG showed chronic left bundle branch block and sinus rhythm.  Patient had frequent PVCs and bigeminy on telemetry.  Her main complaint is increased urinary frequency.  On arrival, she had a low-grade fever 100.2.  She has received 2 doses of IV Rocephin .  She has been complaining of increased fatigue for the past month.  - Will discuss with MD to consider either cardiac catheterization versus stress testing.  With her prior history, I would favor cardiac catheterization.  Addendum: discussed with Dr. Avanell Bob, given lack of chest pain, plan to start on BB to suppress PVCs and obtain coronary CTA  Urinary frequency: Per primary team.  He received 2 doses of IV Rocephin .  White blood cell count was mildly elevated at 12.1.  Tmax on arrival was 100.2.  Viral panel negative for COVID, RSV, influenza.  Urinalysis was negative for UTI.  Blood culture currently pending.  Hypertension: Blood pressure on  arrival mildly elevated in the 150s.  Continue home medication.  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  Neurology was consulted at the time and believe the finding was due to poorly controlled high blood pressure.   Risk Assessment/Risk Scores:     TIMI Risk Score for Unstable Angina or Non-ST Elevation MI:   The patient's TIMI risk score is 4, which indicates a 20% risk of all cause mortality, new or recurrent myocardial infarction or need for urgent revascularization in the next 14 days.    For questions or updates, please contact [facility] Please consult [email or web address] for contact info under    Signed, [name], PA  03/03/2024 11:17 AM  Patient seen and examined   I agree with findings as noted by Rosetta Cons above Pt is an 83 yo with hx of HTN, HL, LBBB, Coronary calcifications, T2DM, CVA    Presented to [facility] ER with c/o increased fatigue, increased urinary frequency   Denies CP   No SOB   IN ER T 100.2  BP 152/   WBC 12.1     Troponin checked:  294, 224   EKG with SR with PVC  Tele with PVCs Echo done   Difficult acoustic windows, endocardium difficult to see Wall motion;  Septal motion consistent with BBB,   Inferior hypokinesis (old) Overall LVEF probably OK  On exam, pt laying flat in bed Lungs are CTA Neck:  JVP is not elevated  Cardiac RRR  No murmurs  Abd is supple   Nontender  Ext are without edema  Elevated trop   May reflect demand in setting of illness, DM   But  With fatigue and some wall motion changes and coronary calcifications would recomm CCTA to evaluate further      Rx b blocker to decrease HR, hopefully help with PVCs      Ola Berger MD

## 2024-03-03 NOTE — Progress Notes (Signed)
   03/03/24 2258  BiPAP/CPAP/SIPAP  BiPAP/CPAP/SIPAP Pt Type Adult  Reason BIPAP/CPAP not in use Non-compliant (PT states she does not use cpap)

## 2024-03-03 NOTE — ED Notes (Signed)
 PT reports feeling "better" since being medicated. PT reports her headache has improved and denies needing anything at this time.

## 2024-03-03 NOTE — H&P (Signed)
 History and Physical    Patient: [name] ZOX:[id number] DOB: April 22, 1941 DOA: 03/03/2024 DOS: the patient was seen and examined on 03/03/2024 PCP: [name], FNP  Patient coming from: Home  Chief Complaint:  Chief Complaint  Patient presents with   Dysuria   HPI: [name] is a 83 y.o. female with medical history significant of abdominal bloating, abdominal pain, history of AKI, stage 3a CKD, carpal tunnel syndrome, atypical chest pain, community-acquired pneumonia, constipation, type 2 diabetes, diverticulosis, diverticulitis, frequent UTIs, history E. coli infection, GERD, hiatal hernia, H. pylori infection, colon polyps, history of palpitations, history of herpes zoster, hyperlipidemia, hypertension, hypokalemia, chronic right knee pain, lightheadedness, history of morbid obesity now class II, osteoarthritis, osteoporosis, right hip pain, history of pneumonia pruritus, pyoderma, paraspinal muscle spasm, subacute intracranial hemorrhage, history of CVA, transient speech disturbance, vaginal discharge, vitamin D  deficiency who has been recently treated for UTI and is presented to the emergency department with complaints of dysuria and frequency.  No flank pain,  or hematuria.  No polyuria, polydipsia, polyphagia or blurred vision.  She was found to be febrile in the emergency department.  Denied rhinorrhea, sore throat, wheezing or hemoptysis.  No chest pain, palpitations, diaphoresis, PND, orthopnea or recent pitting edema of the lower extremities.  No abdominal pain, nausea, emesis, diarrhea, constipation, melena or hematochezia.   Lab work: Urinalysis only showed small hemoglobin, but was otherwise normal.  Coronavirus, influenza and RSV PCR was negative.  CBC showed white count 12.1, hemoglobin 13.2 g/dL platelets 811.  Lactic acid was normal twice.  CMP showed a glucose 198, BUN of 30, creatinine 1.09 and total bilirubin 1.6 mg/dL.  Electrolytes were normal after sodium and  calcium  correction.  Total protein 6.5 and albumin 3.1 g/dL.  AST 54, ALT 52 and alkaline phosphatase 41 units/L.  Imaging: Portable 1 view chest radiograph with no active disease.   ED course: Initial vital signs were temperature 100.2 F, pulse 98, respirations 16, BP 152/69 mmHg O2 sat 97%.  The patient received ceftriaxone  1 g IVPB, acetaminophen  650 mg p.o. x 1 and 1000 mL normal saline bolus.  Review of Systems: As mentioned in the history of present illness. All other systems reviewed and are negative. Past Medical History:  Diagnosis Date   Abdominal bloating 02/17/2019   Abdominal pain    AKI (acute kidney injury) (HCC) 02/12/2023   Arthritis    cervical disc degeneration/ oa left knee, carpal tunnel rt wrist, adhesive capsulitis right shoulder, rt hand weakness; lumbar degeneration   Carpal tunnel syndrome    CARPAL TUNNEL SYNDROME, RIGHT 10/14/2010   Qualifier: Diagnosis of   By: Nasario Badder MD, Sallyanne Creamer        CHEST PAIN, ATYPICAL 07/01/2010   Qualifier: Diagnosis of   By: Rodrick Clapper MD, Jackqulyn Masse acquired pneumonia 05/06/2020   Complication of anesthesia 2006-at [facility]   breathing problems-no BP med given prior to surgery;  hx of being very sleepy after colon surgery --  states no problems with last right total knee replacement 2013   Constipation    Diabetes mellitus without complication (HCC)    borderline - diet control   Diverticulitis hx of   Diverticulosis    E. coli infection    2020   Frequent UTI    hx of urethral injury during colon surgery - states frequent uti's since   GERD (gastroesophageal reflux disease)    Gout, unspecified 09/09/2007   Qualifier: Diagnosis  of   By: Larrie Po MD, Lenox Raider. pylori infection 07/18/2010   H/O hiatal hernia    HIP PAIN, LEFT, CHRONIC 11/09/2010   Qualifier: Diagnosis of   By: Larrie Po MD, Wilmon Hashimoto        History of colon polyps 08/26/2008   History of palpitations    in the past   History of shingles    has  a lingering itching on back where shingles were   Hyperlipidemia    Hypertension    Hypokalemia 10/19/2022   KNEE PAIN, RIGHT, CHRONIC 10/14/2010   Qualifier: Diagnosis of   By: Nasario Badder MD, Sallyanne Creamer        Lightheaded 02/17/2019   Morbidly obese (HCC) 05/31/2011   bmi of 45     Numbness and tingling in right hand    pt. 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   Shortness of breath 09/14/2021   Shortness of breath 09/14/2021   Spasm of lumbar paraspinous muscle 06/17/2011   Stroke (HCC)    Stroke-like symptom    Subacute intracranial hemorrhage (HCC) 10/18/2022   Transient speech disturbance 04/18/2022   Urinary frequency 02/17/2019   Vaginal discharge 08/02/2011   Vitamin D  deficiency    Past Surgical History:  Procedure Laterality Date   ABDOMINAL HYSTERECTOMY     bladder tack     BREAST BIOPSY Left    BREAST SURGERY     breast duct resection- benign   CARPAL TUNNEL RELEASE Right 06/04/2014   Procedure: RIGHT CARPAL TUNNEL RELEASE;  Surgeon: Ronn Cohn, MD;  Location: [facility];  Service: Orthopedics;  Laterality: Right;   COLON RESECTION  2008   hx diverticulosis   COLON SURGERY     COLONOSCOPY     colonoscopy [id number]     JOINT REPLACEMENT     OOPHORECTOMY     POLYPECTOMY     skin graft left arm - traumatic compression injury left upper arm     temporary ureter stent     TOTAL KNEE ARTHROPLASTY  01/05/2012   Procedure: TOTAL KNEE ARTHROPLASTY;  Surgeon: Aurther Blue, MD;  Location: [facility];  Service: Orthopedics;  Laterality: Right;   TOTAL KNEE ARTHROPLASTY Left 08/17/2014   Procedure: LEFT TOTAL KNEE ARTHROPLASTY;  Surgeon: Aurther Blue, MD;   Location: [facility];  Service: Orthopedics;  Laterality: Left;   ureter repair for tyransected left ureter     Social History:  reports that she has never smoked. She has never used smokeless tobacco. She reports that she does not drink alcohol and does not use drugs.  Allergies  Allergen Reactions   Bee Venom Hives   Latex Hives and Itching   Codeine Other (See Comments)    Hallucinations    Hydrocodone Other (See Comments)    hallucinations   Oxycodone  Other (See Comments)    hallucinations    Family History  Problem Relation Age of Onset   Breast cancer Mother 21   Hypertension Mother    Prostate cancer Father    Hypertension Father    Dementia Sister    Lung cancer Brother  Hypertension Brother    COPD Brother    Cancer Maternal Grandmother    Arthritis Sister    Hypertension Sister    Arthritis Sister    Hypertension Child    Hypertension Child    Hypertension Child    Colon polyps Neg Hx    Esophageal cancer Neg Hx    Rectal cancer Neg Hx    Stomach cancer Neg Hx    Colon cancer Neg Hx     Prior to Admission medications   Medication Sig Start Date End Date Taking? Authorizing Provider  aspirin  EC 81 MG tablet Take 81 mg by mouth daily. Swallow whole.   Yes [provider]  Blood Pressure KIT 1 Units by Does not apply route daily. Check blood pressure daily for hypertension dx code I10 09/14/21  Yes Maudine Sos, MD  Lancets [facility] DELICA PLUS [place]) MISC USE TO CHECK BLOOD SUGAR TWICE DAILY 12/24/20  Yes Cleave Curling, MD  nitrofurantoin , macrocrystal-monohydrate, (MACROBID ) 100 MG capsule Take 100 mg by mouth 7 days.   Yes [provider]  Olmesartan -amLODIPine -HCTZ 40-10-12.5 MG TABS Take 1 tablet by mouth daily. 01/01/24  Yes [name], FNP  predniSONE  (DELTASONE ) 10 MG tablet Take 10 mg by mouth as directed.   Yes [provider]  rosuvastatin  (CRESTOR ) 40 MG tablet Take 1 tablet (40 mg total) by mouth daily. 02/13/23   Yes Amin, Sumayya, MD  cyanocobalamin (VITAMIN B12) 1000 MCG tablet Take 3,000 mcg by mouth daily. Patient not taking: Reported on 03/03/2024    [provider]  dapagliflozin  propanediol (FARXIGA ) 5 MG TABS tablet Take 1 tablet (5 mg total) by mouth daily. Patient not taking: Reported on 12/18/2023 10/22/23   [name], FNP  estradiol (ESTRACE) 0.1 MG/GM vaginal cream Place 1 Applicatorful vaginally 2 (two) times a week. Patient not taking: Reported on 03/03/2024 11/06/23   [provider]  hydrALAZINE  (APRESOLINE ) 10 MG tablet Take 10 mg by mouth. Patient not taking: Reported on 03/03/2024    [provider]  lidocaine  (XYLOCAINE ) 2 % solution Use as directed 15 mLs in the mouth or throat every 6 (six) hours as needed (painful swallowing). Patient not taking: Reported on 03/03/2024 12/15/23   Iva Mariner, MD  magnesium  30 MG tablet Take 400 mg by mouth daily. Patient not taking: Reported on 03/03/2024    [provider]  Multiple Vitamins-Minerals (WOMENS 50+ MULTI VITAMIN) TABS Take 1 tablet by mouth daily. Patient not taking: Reported on 03/03/2024 08/24/21   [provider]  Emmett Harman VERIO test strip USE TO TEST 2 TIMES DAILY Patient not taking: Reported on 03/03/2024 12/20/20   Cleave Curling, MD  pantoprazole  (PROTONIX ) 40 MG tablet Take 40 mg by mouth daily. Patient not taking: Reported on 03/03/2024 06/02/23   [provider]  solifenacin  (VESICARE ) 5 MG tablet Take 1 tablet (5 mg total) by mouth daily. Patient not taking: Reported on 03/03/2024 10/04/23   [name], FNP    Physical Exam: Vitals:   03/03/24 0615 03/03/24 0630 03/03/24 0715 03/03/24 0730  BP: 135/65 (!) 144/60 (!) 137/57 (!) 129/58  Pulse: 78 80 79 80  Resp: 20 (!) 24 (!) 24 (!) 25  Temp:      TempSrc:      SpO2: 98% 97% 96% 93%   Physical Exam Vitals and nursing note reviewed.  Constitutional:      General: She is awake. She is not in acute distress.    Appearance: She  is obese. She is  ill-appearing.  HENT:     Head: Normocephalic.     Nose: No rhinorrhea.     Mouth/Throat:     Mouth: Mucous membranes are moist.  Eyes:     General: No scleral icterus.    Pupils: Pupils are equal, round, and reactive to light.  Neck:     Vascular: No JVD.  Cardiovascular:     Rate and Rhythm: Normal rate and regular rhythm.     Heart sounds: S1 normal and S2 normal.  Pulmonary:     Effort: Pulmonary effort is normal.     Breath sounds: Normal breath sounds. No wheezing, rhonchi or rales.  Abdominal:     General: Abdomen is protuberant. Bowel sounds are normal. There is no distension.     Palpations: Abdomen is soft.     Tenderness: There is no abdominal tenderness. There is no guarding.  Musculoskeletal:     Cervical back: Neck supple.     Right lower leg: No edema.     Left lower leg: No edema.  Skin:    General: Skin is warm and dry.  Neurological:     General: No focal deficit present.     Mental Status: She is alert and oriented to person, place, and time.  Psychiatric:        Mood and Affect: Mood normal.        Behavior: Behavior normal. Behavior is cooperative.    Data Reviewed:  Results are pending, will review when available.  02/12/2023 echocardiogram results. IMPRESSIONS:   1. Left ventricular ejection fraction, by estimation, is 50 to 55%. The  left ventricle has low normal function. The left ventricle has no regional  wall motion abnormalities. Left ventricular diastolic function could not  be evaluated.   2. Right ventricular systolic function is normal. The right ventricular  size is normal. Tricuspid regurgitation signal is inadequate for assessing  PA pressure.   3. The mitral valve is normal in structure. No evidence of mitral valve  regurgitation. No evidence of mitral stenosis. Severe mitral annular  calcification.   4. The aortic valve is calcified. Aortic valve regurgitation is not  visualized. Mild aortic valve stenosis.  Aortic valve area, by VTI measures  1.69 cm. Aortic valve mean gradient measures 14.0 mmHg. Aortic valve Vmax  measures 2.58 m/s.   5. The inferior vena cava is normal in size with greater than 50%  respiratory variability, suggesting right atrial pressure of 3 mmHg.   EKG: Vent. rate 94 BPM PR interval 139 ms QRS duration 159 ms QT/QTcB 385/482 ms P-R-T axes 52 -11 166 Sinus rhythm Ventricular premature complex Left bundle branch block  Assessment and Plan: Principal Problem:   Acute cystitis Admit to PCU/inpatient. Continue ceftriaxone  2 g IVPB daily. Antispasmodics as needed. No urine culture was collected. Follow-up CBC and chemistry in the morning. Follow-up with PCP for urine culture and sensitivity.  Active Problems:   Elevated troponin Cardiology consult appreciated. Started on beta-blockers. Will have coronary CT in AM.    Essential hypertension Continue olmesartan  or formulary equivalent. Continue hydrochlorothiazide 12.5 mg p.o. daily. Continue amlodipine  10 mg p.o. daily. Started on metoprolol 25 mg p.o. 4 times a day.    Aortic atherosclerosis (HCC)   Hyperlipidemia associated with type 2 diabetes mellitus (HCC) Rosuvastatin  40 mg p.o. daily.    Gastroesophageal reflux disease without esophagitis Antiacid, H2 blocker or PPI as needed.    Hypertensive nephropathy   Chronic kidney disease, stage 3a (HCC) Monitor renal function  electrolytes. Continue angiotensin receptor blocker.    OSA on CPAP CPAP at bedtime.    Type 2 diabetes mellitus with hyperglycemia (HCC) Carbohydrate modified diet. CBG monitoring with RI SS. Check hemoglobin A1c.    Mild protein malnutrition (HCC) Recheck albumin level in the morning. Might benefit from protein supplementation.    Transaminitis   Hyperbilirubinemia Recheck LFTs in the morning. Further workup depending on results.    Pseudohyponatremia Follow sodium and glucose level in AM.    Class 2  obesity Current BMI 38.29 kg/m. Would benefit from lifestyle modifications. Follow-up closely with PCP and/or bariatric clinic.    Advance Care Planning:   Code Status: Full Code   Consults: Cardiology Ola Berger, MD).  Family Communication:   Severity of Illness: The appropriate patient status for this patient is OBSERVATION. Observation status is judged to be reasonable and necessary in order to provide the required intensity of service to ensure the patient's safety. The patient's presenting symptoms, physical exam findings, and initial radiographic and laboratory data in the context of their medical condition is felt to place them at decreased risk for further clinical deterioration. Furthermore, it is anticipated that the patient will be medically stable for discharge from the hospital within 2 midnights of admission.   Author: Danice Dural, MD 03/03/2024 7:54 AM  For on call review [email or web address].   This document was prepared using Dragon voice recognition software and may contain some unintended transcription errors.

## 2024-03-03 NOTE — Care Management Obs Status (Signed)
 MEDICARE OBSERVATION STATUS NOTIFICATION   Patient Details  Name: [name] MRN: [id number] Date of Birth: April 09, 1941   Medicare Observation Status Notification Given:  Yes    MahabirThersia Flax, RN 03/03/2024, 3:51 PM

## 2024-03-03 NOTE — Progress Notes (Signed)
 I met with [name], alongside her husband and daughter, to provide emotional and spiritual support through prayer, at her request.  They have good support and are each in ministry as well.  Their faith is very important to them.

## 2024-03-03 NOTE — ED Notes (Signed)
 Pt utilized bedpan for urinal output. Pt requires limited assist w/ bed mobility. No complaints. Noted pt c/o chills. Temp 99.6. RN aware

## 2024-03-03 NOTE — ED Provider Notes (Signed)
 [name] Provider Note   CSN: [id number] Arrival date & time: 03/03/24  8295     History  Chief Complaint  Patient presents with   Dysuria    [name] is a 83 y.o. female.  The history is provided by the patient and medical records.  Dysuria  83 year old female with history of hypertension, hyperlipidemia, GERD, frequent UTIs, presenting to the ED for generalized weakness and overall feeling poorly.  She has been feeling unwell for about 3 weeks now, seems to be progressively worsening.  She was seen by PCP and diagnosed with a UTI and started on Macrobid .  She has been taking this for a few days now but seems to be getting worse.  Yesterday she developed cold chills and subjective fever.  Husband notes that she occasionally has been talking out of her head and has been more weak, almost has no energy to get up and move about which is atypical for her.  She has not had any nausea or vomiting.  She is still eating and drinking well.  She has not had any syncopal episodes.  No falls or head trauma.  Home Medications Prior to Admission medications   Medication Sig Start Date End Date Taking? Authorizing Provider  aspirin  EC 81 MG tablet Take 81 mg by mouth daily. Swallow whole.    [provider]  Blood Pressure KIT 1 Units by Does not apply route daily. Check blood pressure daily for hypertension dx code I10 09/14/21   Maudine Sos, MD  cyanocobalamin (VITAMIN B12) 1000 MCG tablet Take 3,000 mcg by mouth daily.    [provider]  dapagliflozin  propanediol (FARXIGA ) 5 MG TABS tablet Take 1 tablet (5 mg total) by mouth daily. Patient not taking: Reported on 02/18/2024 10/22/23   [name], FNP  estradiol (ESTRACE) 0.1 MG/GM vaginal cream Place 1 Applicatorful vaginally 2 (two) times a week. 11/06/23   [provider]  hydrALAZINE  (APRESOLINE ) 10 MG tablet Take 10 mg by mouth.    [provider]  Lancets (ONETOUCH DELICA PLUS LANCET33G) MISC USE TO CHECK BLOOD SUGAR TWICE DAILY 12/24/20   Cleave Curling, MD  lidocaine  (XYLOCAINE ) 2 % solution Use as directed 15 mLs in the mouth or throat every 6 (six) hours as needed (painful swallowing). 12/15/23   Iva Mariner, MD  magnesium  30 MG tablet Take 400 mg by mouth daily.    [provider]  Multiple Vitamins-Minerals (WOMENS 50+ MULTI VITAMIN) TABS Take 1 tablet by mouth daily. 08/24/21   [provider]  Olmesartan -amLODIPine -HCTZ 40-10-12.5 MG TABS Take 1 tablet by mouth daily. 01/01/24   [name], FNP  [facility] VERIO test strip USE TO TEST 2 TIMES DAILY 12/20/20   Cleave Curling, MD  pantoprazole  (PROTONIX ) 40 MG tablet Take 40 mg by mouth daily. 06/02/23   [provider]  rosuvastatin  (CRESTOR ) 40 MG tablet Take 1 tablet (40 mg total) by mouth daily. 02/13/23   Luna Salinas, MD  solifenacin  (VESICARE ) 5 MG tablet Take 1 tablet (5 mg total) by mouth daily. 10/04/23   [name], FNP      Allergies    Bee venom, Latex, Codeine, Hydrocodone, and Oxycodone     Review of Systems   Review of Systems  Genitourinary:  Positive for dysuria.  All other systems reviewed and are negative.   Physical Exam Updated Vital Signs BP (!) 152/69   Pulse 98   Temp 100.2 F (37.9 C) (Oral)  Resp 16   SpO2 97%   Physical Exam Vitals and nursing note reviewed.  Constitutional:      Appearance: She is well-developed.     Comments: Elderly, appears weak and fatigued  HENT:     Head: Normocephalic and atraumatic.  Eyes:     Conjunctiva/sclera: Conjunctivae normal.     Pupils: Pupils are equal, round, and reactive to light.  Cardiovascular:     Rate and Rhythm: Normal rate and regular rhythm.     Heart sounds: Normal heart sounds.  Pulmonary:     Effort: Pulmonary effort is normal.     Breath sounds: Normal breath sounds.  Abdominal:     General: Bowel sounds are normal.     Palpations: Abdomen is soft.   Musculoskeletal:        General: Normal range of motion.     Cervical back: Normal range of motion.  Skin:    General: Skin is warm and dry.  Neurological:     Mental Status: She is alert and oriented to person, place, and time.     ED Results / Procedures / Treatments   Labs (all labs ordered are listed, but only abnormal results are displayed) Labs Reviewed  CBC WITH DIFFERENTIAL/PLATELET - Abnormal; Notable for the following components:      Result Value   WBC 12.1 (*)    Neutro Abs 10.4 (*)    Lymphs Abs 0.6 (*)    Abs Immature Granulocytes 0.12 (*)    All other components within normal limits  COMPREHENSIVE METABOLIC PANEL WITH GFR - Abnormal; Notable for the following components:   Sodium 134 (*)    Glucose, Bld 198 (*)    BUN 30 (*)    Creatinine, Ser 1.09 (*)    Calcium  8.3 (*)    Albumin 3.1 (*)    AST 54 (*)    ALT 52 (*)    Total Bilirubin 1.6 (*)    GFR, Estimated 51 (*)    All other components within normal limits  URINALYSIS, W/ REFLEX TO CULTURE (INFECTION SUSPECTED) - Abnormal; Notable for the following components:   Hgb urine dipstick SMALL (*)    All other components within normal limits  TROPONIN I (HIGH SENSITIVITY) - Abnormal; Notable for the following components:   Troponin I (High Sensitivity) 294 (*)    All other components within normal limits  RESP PANEL BY RT-PCR (RSV, FLU A&B, COVID)  RVPGX2  CULTURE, BLOOD (ROUTINE X 2)  CULTURE, BLOOD (ROUTINE X 2)  I-STAT CG4 LACTIC ACID, ED  I-STAT CG4 LACTIC ACID, ED  TROPONIN I (HIGH SENSITIVITY)    EKG EKG Interpretation Date/Time:  Monday Mar 03 2024 03:18:00 EDT Ventricular Rate:  94 PR Interval:  139 QRS Duration:  159 QT Interval:  385 QTC Calculation: 482 R Axis:   -11  Text Interpretation: Sinus rhythm Ventricular premature complex Left bundle branch block No significant change since last tracing Confirmed by Ballard Bongo ([id number]) on 03/03/2024 4:38:56 AM  Radiology DG Chest  Port 1 View Result Date: 03/03/2024 CLINICAL DATA:  Cough and fever EXAM: PORTABLE CHEST 1 VIEW COMPARISON:  12/15/2023 FINDINGS: Cardiac shadow is stable. Elevation the right hemidiaphragm is again seen and stable. No focal infiltrate or effusion is noted. No bony abnormality is noted. IMPRESSION: No active disease. Electronically Signed   By: Violeta Grey M.D.   On: 03/03/2024 03:21    Procedures Procedures    CRITICAL CARE Performed by: Coretha Dew  Total critical care time: 45 minutes  Critical care time was exclusive of separately billable procedures and treating other patients.  Critical care was necessary to treat or prevent imminent or life-threatening deterioration.  Critical care was time spent personally by me on the following activities: development of treatment plan with patient and/or surrogate as well as nursing, discussions with consultants, evaluation of patient's response to treatment, examination of patient, obtaining history from patient or surrogate, ordering and performing treatments and interventions, ordering and review of laboratory studies, ordering and review of radiographic studies, pulse oximetry and re-evaluation of patient's condition.   Medications Ordered in ED Medications  cefTRIAXone  (ROCEPHIN ) 1 g in sodium chloride  0.9 % 100 mL IVPB (has no administration in time range)  acetaminophen  (TYLENOL ) tablet 650 mg (650 mg Oral Given 03/03/24 0405)  sodium chloride  0.9 % bolus 1,000 mL (1,000 mLs Intravenous New Bag/Given 03/03/24 0404)    ED Course/ Medical Decision Making/ A&P                                 Medical Decision Making Amount and/or Complexity of Data Reviewed Labs: ordered. Radiology: ordered and independent interpretation performed. ECG/medicine tests: ordered and independent interpretation performed.  Risk OTC drugs. Decision regarding hospitalization.   83 year old female presenting to the ED with dysuria and urinary  frequency.  Currently being treated for UTI with Macrobid  from PCP.  Husband reports she seems to be worsening.  Now more generally weak, low energy, etc.   Patient was found to have low-grade fever here at 100.42F but she is hemodynamically stable.  She does appear unwell and quite weak on exam.  She seems to get fatigued just with questioning and physical exam.  Plan for labs, cultures, chest x-ray, RVP, UA.  Did have some runs of bigeminy after being connected to cardiac monitoring here, unfortunately not able to be captured.  No chest pain reported.  Will obtain EKG and troponin as well.  Labs as above--mild leukocytosis but normal lactate.  BUN is elevated, similar to prior.  She is given IV fluids.  Chest x-ray is clear.  RVP is negative.  UA with some blood but no bacteria seen.  I question if she has a partially treated UTI as she is still quite symptomatic from a urinary standpoint.  Trop today 298.  Patient remains without active chest pain here.  EKG unchanged from prior.  NSR on monitoring.  Did have prior nuclear stress test in 2018 which was low risk, mildly reduced EF.  Has never had formal cardiac cath.  Discussed with Dr. Brock Canner-- will admit for ongoing care.    Spoke with cardiology, Dr. Vergie Glass-- ok to be admitted at [facility].  Recommended trend trops and get echo in AM.  If trop drastically rising, cardiology can formally consult in AM.   Final Clinical Impression(s) / ED Diagnoses Final diagnoses:  Urinary frequency  Dysuria  Fever, unspecified fever cause  Elevated troponin    Rx / DC Orders ED Discharge Orders     None         Coretha Dew, PA-C 03/03/24 0542    Ballard Bongo, MD 03/03/24 [phone]

## 2024-03-03 NOTE — TOC Initial Note (Signed)
 Transition of Care [facility]) - Initial/Assessment Note    Patient Details  Name: [name] MRN: [id number] Date of Birth: 14-Jun-1941  [facility] CM/SW Contact:    Ruben Corolla, RN Phone Number: 03/03/2024, 1:58 PM  Clinical Narrative: d/c plan home.                  Expected Discharge Plan: Home/Self Care Barriers to Discharge: Continued Medical Work up   Patient Goals and CMS Choice Patient states their goals for this hospitalization and ongoing recovery are:: Home CMS Medicare.gov Compare Post Acute Care list provided to:: Patient Represenative (must comment) (Shelley(spouse)) Choice offered to / list presented to : Spouse [place] ownership interest in [facility].provided to:: Spouse    Expected Discharge Plan and Services   Discharge Planning Services: CM Consult   Living arrangements for the past 2 months: Single Family Home                                      Prior Living Arrangements/Services Living arrangements for the past 2 months: Single Family Home Lives with:: Spouse   Do you feel safe going back to the place where you live?: Yes               Activities of Daily Living   ADL Screening (condition at time of admission) Independently performs ADLs?: Yes (appropriate for developmental age) Is the patient deaf or have difficulty hearing?: No Does the patient have difficulty seeing, even when wearing glasses/contacts?: No Does the patient have difficulty concentrating, remembering, or making decisions?: No  Permission Sought/Granted Permission sought to share information with : Case Manager Permission granted to share information with : Yes, Verbal Permission Granted  Share Information with NAME: Case manager           Emotional Assessment              Admission diagnosis:  Acute cystitis [N30.00] Dysuria [R30.0] Urinary frequency [R35.0] Elevated troponin [R79.89] Fever, unspecified fever cause  [R50.9] Patient Active Problem List   Diagnosis Date Noted   Acute cystitis 03/03/2024   Elevated troponin 03/03/2024   Type 2 diabetes mellitus with hyperglycemia (HCC) 03/03/2024   Mild protein malnutrition (HCC) 03/03/2024   Transaminitis 03/03/2024   Pseudohyponatremia 03/03/2024   Hyperbilirubinemia 03/03/2024   Class 2 obesity 03/03/2024   Hypoglycemia 06/23/2023   History of TIA (transient ischemic attack) 02/12/2023   OSA on CPAP 10/19/2022   Urge incontinence 08/23/2022   Chronic kidney disease, stage 3a (HCC) 07/17/2021   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   Aortic atherosclerosis (HCC) 01/03/2021   Class 2 severe obesity due to excess calories with serious comorbidity and body mass index (BMI) of 39.0 to 39.9 in adult (HCC) 01/03/2021   Vitamin D  deficiency 06/01/2020   History of arthroplasty of right knee 05/11/2020   Constipation 06/26/2018   Type 2 diabetes mellitus with other specified complication (HCC) 06/19/2014   OA (osteoarthritis) of knee 01/05/2012   LBBB (left bundle branch block) 07/26/2010   Gastroesophageal reflux disease without esophagitis 10/06/2008   History of colonic polyps 08/26/2008   Hyperlipidemia associated with type 2 diabetes mellitus (HCC) 06/03/2007   Essential hypertension 06/03/2007   PCP:  [name], FNP Pharmacy:   [id number] [place], [place] - [address] AT [address] [place] [address] Phone: [phone] Fax: [phone]  Social Drivers of Health (SDOH) Social History: SDOH Screenings   Food Insecurity: No Food Insecurity (03/03/2024)  Housing: Low Risk  (03/03/2024)  Transportation Needs: No Transportation Needs (03/03/2024)  Utilities: Not At Risk (03/03/2024)  Alcohol Screen: Low Risk  (09/19/2023)  Depression (PHQ2-9): Low Risk  (12/18/2023)  Financial Resource Strain: Low Risk  (10/02/2023)  Physical Activity: Inactive (10/02/2023)  Social Connections: Socially Integrated  (03/03/2024)  Stress: No Stress Concern Present (10/02/2023)  Recent Concern: Stress - Stress Concern Present (09/19/2023)  Tobacco Use: Low Risk  (03/03/2024)  Health Literacy: Adequate Health Literacy (09/25/2023)   SDOH Interventions:     Readmission Risk Interventions     No data to display

## 2024-03-04 ENCOUNTER — Other Ambulatory Visit: Payer: Self-pay | Admitting: Physician Assistant

## 2024-03-04 ENCOUNTER — Observation Stay (HOSPITAL_COMMUNITY)

## 2024-03-04 ENCOUNTER — Other Ambulatory Visit: Payer: Self-pay

## 2024-03-04 ENCOUNTER — Observation Stay

## 2024-03-04 DIAGNOSIS — Z8679 Personal history of other diseases of the circulatory system: Secondary | ICD-10-CM

## 2024-03-04 DIAGNOSIS — R300 Dysuria: Secondary | ICD-10-CM | POA: Diagnosis not present

## 2024-03-04 DIAGNOSIS — R7989 Other specified abnormal findings of blood chemistry: Secondary | ICD-10-CM

## 2024-03-04 DIAGNOSIS — A419 Sepsis, unspecified organism: Secondary | ICD-10-CM | POA: Diagnosis not present

## 2024-03-04 DIAGNOSIS — R509 Fever, unspecified: Secondary | ICD-10-CM

## 2024-03-04 DIAGNOSIS — N390 Urinary tract infection, site not specified: Secondary | ICD-10-CM | POA: Diagnosis not present

## 2024-03-04 DIAGNOSIS — R29712 NIHSS score 12: Secondary | ICD-10-CM | POA: Diagnosis not present

## 2024-03-04 DIAGNOSIS — I639 Cerebral infarction, unspecified: Secondary | ICD-10-CM

## 2024-03-04 DIAGNOSIS — I493 Ventricular premature depolarization: Secondary | ICD-10-CM

## 2024-03-04 DIAGNOSIS — R079 Chest pain, unspecified: Secondary | ICD-10-CM

## 2024-03-04 LAB — RESPIRATORY PANEL BY PCR

## 2024-03-04 LAB — BASIC METABOLIC PANEL WITH GFR
Anion gap: 10 (ref 5–15)
BUN: 29 mg/dL — ABNORMAL HIGH (ref 8–23)
CO2: 26 mmol/L (ref 22–32)
Calcium: 8.9 mg/dL (ref 8.9–10.3)
Chloride: 101 mmol/L (ref 98–111)
Creatinine, Ser: 1.17 mg/dL — ABNORMAL HIGH (ref 0.44–1.00)
GFR, Estimated: 47 mL/min — ABNORMAL LOW (ref >60)
Glucose, Bld: 124 mg/dL — ABNORMAL HIGH (ref 70–99)
Potassium: 3.8 mmol/L (ref 3.5–5.1)
Sodium: 137 mmol/L (ref 135–145)

## 2024-03-04 LAB — GLUCOSE, CAPILLARY
Glucose-Capillary: 153 mg/dL — ABNORMAL HIGH (ref 70–99)
Glucose-Capillary: 168 mg/dL — ABNORMAL HIGH (ref 70–99)
Glucose-Capillary: 165 mg/dL — ABNORMAL HIGH (ref 70–99)
Glucose-Capillary: 135 mg/dL — ABNORMAL HIGH (ref 70–99)
Glucose-Capillary: 233 mg/dL — ABNORMAL HIGH (ref 70–99)

## 2024-03-04 LAB — CBC
HCT: 43.2 % (ref 36.0–46.0)
Hemoglobin: 14.1 g/dL (ref 12.0–15.0)
MCH: 29.1 pg (ref 26.0–34.0)
MCHC: 32.6 g/dL (ref 30.0–36.0)
MCV: 89.1 fL (ref 80.0–100.0)
Platelets: 192 10*3/uL (ref 150–400)
RBC: 4.85 MIL/uL (ref 3.87–5.11)
RDW: 13.5 % (ref 11.5–15.5)
WBC: 7.7 10*3/uL (ref 4.0–10.5)
nRBC: 0 % (ref 0.0–0.2)

## 2024-03-04 LAB — PROCALCITONIN: Procalcitonin: 0.13 ng/mL

## 2024-03-04 LAB — BRAIN NATRIURETIC PEPTIDE: B Natriuretic Peptide: 157 pg/mL — ABNORMAL HIGH (ref 0.0–100.0)

## 2024-03-04 MED ORDER — FUROSEMIDE 10 MG/ML IJ SOLN
20.0000 mg | Freq: Once | INTRAMUSCULAR | Status: AC
  Administered 2024-03-04: 20 mg via INTRAVENOUS
  Filled 2024-03-04: qty 2

## 2024-03-04 MED ORDER — METOPROLOL TARTRATE 25 MG PO TABS: 25.0000 mg | ORAL_TABLET | Freq: Two times a day (BID) | ORAL | Status: DC

## 2024-03-04 MED ORDER — ASPIRIN 300 MG RE SUPP
300.0000 mg | Freq: Every day | RECTAL | Status: DC
  Filled 2024-03-04 (×5): qty 1

## 2024-03-04 MED ORDER — SODIUM CHLORIDE 0.9 % IV SOLN
500.0000 mg | INTRAVENOUS | Status: DC
  Administered 2024-03-04: 500 mg via INTRAVENOUS
  Filled 2024-03-04: qty 5

## 2024-03-04 MED ORDER — ASPIRIN 81 MG PO CHEW
81.0000 mg | CHEWABLE_TABLET | Freq: Every day | ORAL | Status: DC
  Administered 2024-03-04 – 2024-03-08 (×4): 81 mg via ORAL
  Filled 2024-03-04 (×4): qty 1

## 2024-03-04 MED ORDER — PANTOPRAZOLE SODIUM 40 MG PO TBEC
40.0000 mg | DELAYED_RELEASE_TABLET | Freq: Every day | ORAL | Status: DC
  Administered 2024-03-04 – 2024-03-08 (×4): 40 mg via ORAL
  Filled 2024-03-04 (×4): qty 1

## 2024-03-04 MED ORDER — AZITHROMYCIN 500 MG PO TABS: 500.0000 mg | ORAL_TABLET | Freq: Every day | ORAL | Status: DC

## 2024-03-04 MED ORDER — IOHEXOL 350 MG/ML SOLN
75.0000 mL | Freq: Once | INTRAVENOUS | Status: AC | PRN
Start: 2024-03-04 — End: 2024-03-04
  Administered 2024-03-04: 75 mL via INTRAVENOUS

## 2024-03-04 MED ORDER — STROKE: EARLY STAGES OF RECOVERY BOOK
Freq: Once | Status: AC
  Filled 2024-03-04: qty 1

## 2024-03-04 MED ORDER — METOPROLOL TARTRATE 50 MG PO TABS
50.0000 mg | ORAL_TABLET | Freq: Two times a day (BID) | ORAL | Status: DC
  Administered 2024-03-04: 50 mg via ORAL
  Filled 2024-03-04: qty 1

## 2024-03-04 MED ORDER — DILTIAZEM HCL 25 MG/5ML IV SOLN
10.0000 mg | Freq: Once | INTRAVENOUS | Status: AC
  Administered 2024-03-04: 10 mg via INTRAVENOUS
  Filled 2024-03-04: qty 5

## 2024-03-04 MED ORDER — BENZONATATE 100 MG PO CAPS
100.0000 mg | ORAL_CAPSULE | Freq: Two times a day (BID) | ORAL | Status: DC | PRN
  Administered 2024-03-04 – 2024-03-06 (×5): 100 mg via ORAL
  Filled 2024-03-04 (×5): qty 1

## 2024-03-04 MED ORDER — CLOPIDOGREL BISULFATE 75 MG PO TABS
75.0000 mg | ORAL_TABLET | Freq: Every day | ORAL | Status: DC
Start: 2024-03-04 — End: 2024-03-08
  Administered 2024-03-04 – 2024-03-08 (×4): 75 mg via ORAL
  Filled 2024-03-04 (×4): qty 1

## 2024-03-04 NOTE — Consult Note (Signed)
 NEUROLOGY TELESTROKE CONSULT NOTE   Date of service: Mar 04, 2024 Patient Name: [name] MRN:  [id number] DOB:  11-01-1940 Chief Complaint: stroke code for aphasia Requesting Provider: Enrigue Harvard, DO  Consult Participants: myself, patient, bedside RN, telestroke RN Location of the provider: [facility] Location of the patient: [facility]  This consult was provided via telemedicine with 2-way video and audio communication. The patient/family was informed that care would be provided in this way and agreed to receive care in this manner.   History of Present Illness  [name] is a 83 y.o. female  has a past medical history of Abdominal bloating (02/17/2019), Abdominal pain, AKI (acute kidney injury) (HCC) (02/12/2023), Arthritis, Carpal tunnel syndrome, CARPAL TUNNEL SYNDROME, RIGHT (10/14/2010), CHEST PAIN, ATYPICAL (07/01/2010), Community acquired pneumonia (05/06/2020), Complication of anesthesia (2006-at [facility]), Constipation, Diabetes mellitus without complication (HCC), Diverticulitis (hx of), Diverticulosis, E. coli infection, Frequent UTI, GERD (gastroesophageal reflux disease), Gout, unspecified (09/09/2007), H. 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   She was recently treated for UTI as an outpatient and presented to the emergency department yesterday with fever as well as complaints of dysuria and frequency.  She had no other signs or symptoms of infection and no neurologic deficits at this time.  She was last known well at 1300 today when she was speaking normally.  Subsequently she was found to be severely aphasic, unable to answer questions, perseverating on her previous nurses name Lauren, and not following commands.  She was able to move her bilateral upper extremities equally and only wiggle her toes and her lower extremities but this was symmetric.  She had a stroke scale of 12.  CT head was personally reviewed which showed no acute process.  She was not eligible for TNK due to prior intracranial hemorrhage.  CTA head and neck showed no LVO and therefore patient was not a candidate for intervention.   LKW: 1300 Modified rankin score: 3-Moderate disability-requires help but walks WITHOUT assistance IV Thrombolysis: no, hx ICH  NIHSS components Score: Comment  1a Level of Conscious 0[x]  1[]  2[]  3[]      1b LOC Questions 0[]  1[x]  2[]       1c LOC Commands 0[]  1[x]  2[]       2 Best Gaze 0[x]  1[]  2[]       3 Visual 0[]  1[]  2[x]  3[]      4 Facial Palsy 0[x]  1[]  2[]  3[]      5a Motor Arm - left 0[x]  1[]  2[]  3[]  4[]  UN[]    5b Motor Arm - Right 0[x]  1[]  2[]  3[]  4[]  UN[]    6a Motor Leg - Left 0[]  1[]  2[]  3[x]  4[]  UN[]    6b Motor Leg - Right 0[]  1[]  2[]  3[x]  4[]  UN[]    7 Limb Ataxia 0[x]  1[]  2[]  3[]  UN[]     8 Sensory 0[x]  1[]  2[]  UN[]      9 Best Language 0[]  1[]  2[x]  3[]      10  Dysarthria 0[x]  1[]  2[]  UN[]      11 Extinct. and Inattention 0[x]  1[]  2[]       TOTAL:  12      ROS   Unable to ascertain due to aphasia  Past History   Past Medical History:  Diagnosis Date   Abdominal bloating 02/17/2019   Abdominal pain    AKI  (acute kidney injury) (HCC) 02/12/2023   Arthritis    cervical disc degeneration/ oa left knee, carpal tunnel rt wrist, adhesive capsulitis right shoulder, rt hand weakness; lumbar degeneration   Carpal tunnel syndrome    CARPAL TUNNEL SYNDROME, RIGHT 10/14/2010   Qualifier: Diagnosis of   By: Nasario Badder MD, Sallyanne Creamer        CHEST PAIN, ATYPICAL 07/01/2010   Qualifier: Diagnosis of   By: Rodrick Clapper MD, Jackqulyn Masse acquired pneumonia 05/06/2020   Complication of anesthesia 2006-at [facility]   breathing problems-no BP med given prior to surgery;  hx of being very sleepy after colon surgery --  states no problems with last right total knee replacement 2013   Constipation    Diabetes mellitus without complication (HCC)    borderline - diet control   Diverticulitis hx of   Diverticulosis    E. coli infection    2020   Frequent UTI    hx of urethral injury during colon surgery - states frequent uti's since   GERD (gastroesophageal reflux disease)    Gout, unspecified 09/09/2007   Qualifier: Diagnosis of   By: Larrie Po MD, Wilmon Hashimoto        H. pylori infection 07/18/2010   H/O hiatal hernia    HIP PAIN, LEFT, CHRONIC 11/09/2010   Qualifier: Diagnosis of   By: Larrie Po MD, Wilmon Hashimoto        History of colon polyps 08/26/2008   History of palpitations    in the past   History of shingles    has a lingering itching on back where shingles were   Hyperlipidemia    Hypertension    Hypokalemia 10/19/2022   KNEE PAIN, RIGHT, CHRONIC 10/14/2010   Qualifier: Diagnosis of   By: Nasario Badder MD, Sallyanne Creamer        Lightheaded 02/17/2019   Morbidly obese (HCC) 05/31/2011   bmi of 44     Numbness and tingling in right hand    pt. states has numbness of right hand very frequently-watch positioning   Obesity    Osteoarthritis    Osteoporosis    Pain    pain left knee and pain right hip and right groin   Pain in joint of right hip 05/11/2020   Personal history of colonic polyps-adenoma 08/26/2008   Pneumonia  2005   Pruritus 01/03/2021   Pyoderma 02/15/2010   Shortness of breath 09/14/2021   Shortness of breath 09/14/2021   Spasm of lumbar paraspinous muscle 06/17/2011   Stroke (HCC)    Stroke-like symptom    Subacute intracranial hemorrhage (HCC) 10/18/2022   Transient speech disturbance 04/18/2022   Urinary frequency 02/17/2019   Vaginal discharge 08/02/2011   Vitamin D  deficiency     Past Surgical History:  Procedure Laterality Date   ABDOMINAL HYSTERECTOMY     bladder tack     BREAST BIOPSY Left    BREAST SURGERY     breast duct resection- benign   CARPAL TUNNEL RELEASE Right 06/04/2014   Procedure: RIGHT CARPAL TUNNEL RELEASE;  Surgeon: Ronn Cohn, MD;  Location: [facility];  Service: Orthopedics;  Laterality: Right;   COLON RESECTION  2008   hx diverticulosis   COLON SURGERY     COLONOSCOPY     colonoscopy [id number]     JOINT REPLACEMENT     OOPHORECTOMY     POLYPECTOMY     skin graft left arm - traumatic compression injury left upper arm     temporary ureter stent     TOTAL KNEE ARTHROPLASTY  01/05/2012   Procedure: TOTAL KNEE ARTHROPLASTY;  Surgeon: Aurther Blue, MD;  Location: [facility];  Service: Orthopedics;  Laterality: Right;   TOTAL KNEE ARTHROPLASTY Left 08/17/2014   Procedure: LEFT TOTAL KNEE ARTHROPLASTY;  Surgeon: Aurther Blue, MD;  Location: [facility];  Service: Orthopedics;  Laterality: Left;   ureter repair for tyransected left ureter      Family History: Family History  Problem Relation Age of Onset   Breast cancer Mother 90   Hypertension Mother    Prostate cancer Father    Hypertension Father    Dementia Sister    Lung cancer Brother    Hypertension Brother    COPD Brother    Cancer Maternal Grandmother    Arthritis Sister    Hypertension Sister    Arthritis Sister    Hypertension Child    Hypertension Child    Hypertension Child    Colon polyps Neg Hx    Esophageal cancer Neg Hx    Rectal cancer Neg Hx    Stomach  cancer Neg Hx    Colon cancer Neg Hx     Social History  reports that she has never smoked. She has never used smokeless tobacco. She reports that she does not drink alcohol and does not use drugs.  Allergies  Allergen Reactions   Bee Venom Hives   Latex Hives and Itching   Codeine Other (See Comments)    Hallucinations    Hydrocodone Other (See Comments)    hallucinations   Oxycodone  Other (See Comments)    hallucinations    Medications   Current Facility-Administered Medications:    acetaminophen  (TYLENOL ) tablet 650 mg, 650 mg, Oral, Q6H PRN, 650 mg at 03/03/24 2143 **OR** acetaminophen  (TYLENOL ) suppository 650 mg, 650 mg, Rectal, Q6H PRN, Howerter, Justin B, DO   irbesartan  (AVAPRO ) tablet 300 mg, 300 mg, Oral, Daily, 300 mg at 03/04/24 0950 **AND** amLODipine  (NORVASC ) tablet 10 mg, 10 mg, Oral, Daily, 10 mg at 03/04/24 0949 **AND** hydrochlorothiazide (HYDRODIURIL) tablet 12.5 mg, 12.5 mg, Oral, Daily, Danice Dural, MD, 12.5 mg at 03/04/24 1610   benzonatate  (TESSALON ) capsule 100 mg, 100 mg, Oral, BID PRN, Vann, Jessica U, DO, 100 mg at 03/04/24 1154   cefTRIAXone  (ROCEPHIN ) 2 g in sodium chloride  0.9 % 100 mL IVPB, 2 g, Intravenous, Q24H, Danice Dural, MD, Last Rate: 200 mL/hr at 03/04/24 0637, 2 g at 03/04/24 9604   insulin  aspart (novoLOG ) injection 0-15 Units, 0-15 Units, Subcutaneous, TID WC, Ortiz, David Manuel, MD, 3 Units at 03/04/24 1154   metoprolol tartrate (LOPRESSOR) tablet 25 mg, 25 mg, Oral, BID, Elmyra Haggard, MD   Oral care mouth rinse, 15 mL, Mouth Rinse, PRN, Danice Dural, MD   pantoprazole  (PROTONIX ) EC tablet 40 mg, 40 mg, Oral, Daily, Vann, Jessica U, DO, 40 mg at 03/04/24 5409   rosuvastatin  (CRESTOR ) tablet 40 mg, 40 mg, Oral, Daily, Danice Dural, MD, 40 mg at 03/04/24 0949  Vitals   Vitals:   03/04/24 1011 03/04/24 1217 03/04/24  1220 03/04/24 1420  BP: (!) 154/87 (!) 132/113 (!) 154/69 128/64  Pulse:  69 66 (!) 53   Resp:  (!) 24  (!) 26  Temp:  99.5 F (37.5 C)    TempSrc:  Oral    SpO2:  96%  99%  Weight:      Height:        Body mass index is 37.68 kg/m.  Physical Exam   Physical Exam Gen: NAD Resp: normal WOB CV: extremities appear well-perfused  Neurologic Examination   Neuro: *MS: alert, oriented to first name and age not month. Follows some single step commands but not all *Speech: nondysarthric, initially severe aphasia nonverbal then perseverating on RN name Lauren. After about 10 min she was able to state her own name and then would perseverate on commands (after being asked to smile she intermittently repeated that for the rest of the exam),  *CN: PERRL 3mm, EOMI, blinks to threat on R but not L, face symmetric at rest, hearing intact to voice *Motor:   No drift in BUE, no movement against gravity BLE, all symmetric *Sensory: grimaces in response to noxious stimuli bilat *Coordination:  UTA 2/2 inability to follow commands *Reflexes:  UTA 2/2 tele-exam *Gait: deferred  Labs/Imaging/Neurodiagnostic studies   CBC:  Recent Labs  Lab 03/08/24 0345  WBC 12.1*  NEUTROABS 10.4*  HGB 13.2  HCT 39.9  MCV 88.9  PLT 181   Basic Metabolic Panel:  Lab Results  Component Value Date   NA 134 (L) 2024/03/08   K 4.1 2024/03/08   CO2 22 08-Mar-2024   GLUCOSE 198 (H) 03/08/2024   BUN 30 (H) Mar 08, 2024   CREATININE 1.09 (H) 03/08/24   CALCIUM  8.3 (L) 2024/03/08   GFRNONAA 51 (L) 03-08-24   GFRAA 54 (L) 06/01/2020   Lipid Panel:  Lab Results  Component Value Date   LDLCALC 198 (H) 02/12/2023   HgbA1c:  Lab Results  Component Value Date   HGBA1C 7.5 (H) 10/19/2023   Urine Drug Screen:     Component Value Date/Time   LABOPIA NONE DETECTED 05/25/2023 1035   COCAINSCRNUR NONE DETECTED 05/25/2023 1035   LABBENZ NONE DETECTED 05/25/2023 1035   AMPHETMU NONE DETECTED 05/25/2023 1035   THCU NONE DETECTED 05/25/2023 1035   LABBARB NONE DETECTED 05/25/2023 1035     Alcohol Level     Component Value Date/Time   ETH <10 05/25/2023 1044   INR  Lab Results  Component Value Date   INR 1.0 05/25/2023   APTT  Lab Results  Component Value Date   APTT 31 05/25/2023   AED levels: No results found for: "PHENYTOIN", "ZONISAMIDE", "LAMOTRIGINE", "LEVETIRACETA"  CT Head without contrast(Personally reviewed): No acute process ASPECTS 10  CT angio Head and Neck with contrast(Personally reviewed): No LVO  ASSESSMENT   This an 83 year old woman with numerous comorbidities described above admitted for urosepsis who developed acute onset of aphasia today at 1300.  She was not eligible for TNK secondary to history of intracranial hemorrhage.  She did not have an LVO on CTA head and neck.  Presentation is concerning for acute ischemic stroke.  RECOMMENDATIONS   - Permissive HTN x48 hrs from sx onset or until stroke ruled out by MRI goal BP <220/120. PRN labetalol  or hydralazine  if BP above these parameters. Avoid oral antihypertensives. - MRI brain wo contrast - TTE - Check A1c and LDL + add statin per guidelines - ASA 81mg  daily + plavix  75mg  daily x21 days f/b ASA 81mg   daily monotherapy after that - q4 hr neuro checks - STAT head CT for any change in neuro exam - Tele - PT/OT/SLP - Stroke education - Amb referral to neurology upon discharge   Neurology at [facility] will f/u on above study results and make final recommendations. ______________________________________________________________________    Signed, Eleni Griffin, MD [facility]

## 2024-03-04 NOTE — Progress Notes (Addendum)
 Rounding Note    Patient Name: [name] Date of Encounter: 03/04/2024  [facility] Cardiologist: Dr. Christean Courts of [facility]   Subjective   Chest feels congested, had coughing last night, left chest felt sore with coughing and laying on the left side.   Inpatient Medications    Scheduled Meds:  irbesartan   300 mg Oral Daily   And   amLODipine   10 mg Oral Daily   And   hydrochlorothiazide  12.5 mg Oral Daily   diltiazem  10 mg Intravenous Once   insulin  aspart  0-15 Units Subcutaneous TID WC   metoprolol tartrate  25 mg Oral QID   rosuvastatin   40 mg Oral Daily   Continuous Infusions:  cefTRIAXone  (ROCEPHIN )  IV 2 g (03/04/24 0637)   PRN Meds: acetaminophen  **OR** acetaminophen , mouth rinse   Vital Signs    Vitals:   03/03/24 1959 03/04/24 0008 03/04/24 0433 03/04/24 0438  BP: (!) 137/59 (!) 141/59 (!) 154/71   Pulse: 68 (!) 59 65   Resp: 18 20 17    Temp: 99.1 F (37.3 C) 98.3 F (36.8 C) 98.2 F (36.8 C)   TempSrc: Oral Oral Oral   SpO2: 97% 94% 97%   Weight:    105.9 kg  Height:        Intake/Output Summary (Last 24 hours) at 03/04/2024 0803 Last data filed at 03/04/2024 4010 Gross per 24 hour  Intake 1049.54 ml  Output 750 ml  Net 299.54 ml      03/04/2024    4:38 AM 03/03/2024   12:45 PM 02/16/2024    1:24 PM  Last 3 Weights  Weight (lbs) 233 lb 7.5 oz 237 lb 3.4 oz 234 lb 2.1 oz  Weight (kg) 105.9 kg 107.6 kg 106.2 kg      Telemetry    NSR with frequent PVCs - Personally Reviewed  ECG    NSR, LBBB and PVC - Personally Reviewed  Physical Exam   GEN: No acute distress.   Neck: No JVD Cardiac: RRR, no murmurs, rubs, or gallops.  Respiratory: Clear to auscultation bilaterally. GI: Soft, nontender, non-distended  MS: No edema; No deformity. Neuro:  Nonfocal  Psych: Normal affect   Labs    High Sensitivity Troponin:   Recent Labs  Lab 03/03/24 0348 03/03/24 0600 03/03/24 1224  TROPONINIHS 294* 224* 164*      Chemistry Recent Labs  Lab 03/03/24 0345 03/03/24 0600  NA 134*  --   K 4.1  --   CL 102  --   CO2 22  --   GLUCOSE 198*  --   BUN 30*  --   CREATININE 1.09*  --   CALCIUM  8.3*  --   MG  --  1.7  PROT 6.5  --   ALBUMIN 3.1*  --   AST 54*  --   ALT 52*  --   ALKPHOS 41  --   BILITOT 1.6*  --   GFRNONAA 51*  --   ANIONGAP 10  --     Lipids No results for input(s): "CHOL", "TRIG", "HDL", "LABVLDL", "LDLCALC", "CHOLHDL" in the last 168 hours.  Hematology Recent Labs  Lab 03/03/24 0345  WBC 12.1*  RBC 4.49  HGB 13.2  HCT 39.9  MCV 88.9  MCH 29.4  MCHC 33.1  RDW 13.7  PLT 181   Thyroid  No results for input(s): "TSH", "FREET4" in the last 168 hours.  BNPNo results for input(s): "BNP", "PROBNP" in the last  168 hours.  DDimer No results for input(s): "DDIMER" in the last 168 hours.   Radiology    ECHOCARDIOGRAM COMPLETE Result Date: 03/03/2024    ECHOCARDIOGRAM REPORT   Patient Name:   [name] Date of Exam: 03/03/2024 Medical Rec #:  [id number]        Height:       67.0 in Accession #:    [id number]       Weight:       234.1 lb Date of Birth:  24-Jul-1941         BSA:          2.162 m Patient Age:    82 years         BP:           129/58 mmHg Patient Gender: F                HR:           82 bpm. Exam Location:  Inpatient Procedure: 2D Echo, Cardiac Doppler, Color Doppler and Intracardiac            Opacification Agent (Both Spectral and Color Flow Doppler were            utilized during procedure). Indications:    Elevated Troponin  History:        Patient has prior history of Echocardiogram examinations.                 Stroke, Signs/Symptoms:Shortness of Breath and Chest Pain; Risk                 Factors:Hypertension, Diabetes and Hyperlipidemia.  Sonographer:    Willey Harrier Referring Phys: [id number] JUSTIN B HOWERTER IMPRESSIONS  1. Left ventricular ejection fraction, by estimation, is 50 to 55%. The left ventricle has low normal function. The left ventricle has no  regional wall motion abnormalities. There is moderate concentric left ventricular hypertrophy. Left ventricular diastolic function could not be evaluated. There is nonspecific abnormal septal motion.  2. Right ventricular systolic function is normal. The right ventricular size is normal. There is normal pulmonary artery systolic pressure.  3. The mitral valve is degenerative. Trivial mitral valve regurgitation. Mild mitral stenosis. Severe mitral annular calcification.  4. The aortic valve is calcified. There is moderate calcification of the aortic valve. There is moderate thickening of the aortic valve. Aortic valve regurgitation is not visualized. Moderate aortic valve stenosis. Aortic valve mean gradient measures 23.0 mmHg.  5. The inferior vena cava is normal in size with greater than 50% respiratory variability, suggesting right atrial pressure of 3 mmHg. Comparison(s): Prior images reviewed side by side. Conclusion(s)/Recommendation(s): LVEF overall unchanged from prior. No clear focal wall motion abnormalities with contrast, though there is some nonspecific septal motion. Moderate aortic stenosis, mild mitral stenosis. FINDINGS  Left Ventricle: Left ventricular ejection fraction, by estimation, is 50 to 55%. The left ventricle has low normal function. The left ventricle has no regional wall motion abnormalities. Definity  contrast agent was given IV to delineate the left ventricular endocardial borders. The left ventricular internal cavity size was normal in size. There is moderate concentric left ventricular hypertrophy. Nonspecific abnormal septal motion. Left ventricular diastolic function could not be evaluated due to mitral annular calcification (moderate or greater). Left ventricular diastolic function could not be evaluated. Right Ventricle: The right ventricular size is normal. Right vetricular wall thickness was not well visualized. Right ventricular systolic function is normal. There is normal  pulmonary artery systolic pressure.  The tricuspid regurgitant velocity is 2.42 m/s, and with an assumed right atrial pressure of 3 mmHg, the estimated right ventricular systolic pressure is 26.4 mmHg. Left Atrium: Left atrial size was normal in size. Right Atrium: Right atrial size was normal in size. Pericardium: There is no evidence of pericardial effusion. Presence of epicardial fat layer. Mitral Valve: MVA 2.5 by PHT. The mitral valve is degenerative in appearance. Severe mitral annular calcification. Trivial mitral valve regurgitation. Mild mitral valve stenosis. MV peak gradient, 13.5 mmHg. The mean mitral valve gradient is 6.0 mmHg. Tricuspid Valve: The tricuspid valve is normal in structure. Tricuspid valve regurgitation is trivial. No evidence of tricuspid stenosis. Aortic Valve: The aortic valve is calcified. There is moderate calcification of the aortic valve. There is moderate thickening of the aortic valve. Aortic valve regurgitation is not visualized. Moderate aortic stenosis is present. Aortic valve mean gradient measures 23.0 mmHg. Aortic valve peak gradient measures 36.7 mmHg. Aortic valve area, by VTI measures 1.35 cm. Pulmonic Valve: The pulmonic valve was not well visualized. Pulmonic valve regurgitation is mild. No evidence of pulmonic stenosis. Aorta: The aortic root and ascending aorta are structurally normal, with no evidence of dilitation. Venous: The inferior vena cava is normal in size with greater than 50% respiratory variability, suggesting right atrial pressure of 3 mmHg. IAS/Shunts: The atrial septum is grossly normal.  LEFT VENTRICLE PLAX 2D LVIDd:         4.79 cm   Diastology LVIDs:         3.18 cm   LV e' medial:    4.79 cm/s LV PW:         1.70 cm   LV E/e' medial:  18.6 LV IVS:        1.80 cm   LV e' lateral:   7.07 cm/s LVOT diam:     2.20 cm   LV E/e' lateral: 12.6 LV SV:         82 LV SV Index:   38 LVOT Area:     3.80 cm  RIGHT VENTRICLE             IVC RV Basal diam:  3.20  cm     IVC diam: 2.00 cm RV S prime:     13.80 cm/s TAPSE (M-mode): 1.8 cm LEFT ATRIUM           Index        RIGHT ATRIUM           Index LA Vol (A2C): 44.9 ml 20.76 ml/m  RA Area:     14.60 cm LA Vol (A4C): 52.5 ml 24.28 ml/m  RA Volume:   35.60 ml  16.46 ml/m  AORTIC VALVE AV Area (Vmax):    1.41 cm AV Area (Vmean):   1.45 cm AV Area (VTI):     1.35 cm AV Vmax:           303.00 cm/s AV Vmean:          227.000 cm/s AV VTI:            0.613 m AV Peak Grad:      36.7 mmHg AV Mean Grad:      23.0 mmHg LVOT Vmax:         112.00 cm/s LVOT Vmean:        86.300 cm/s LVOT VTI:          0.217 m LVOT/AV VTI ratio: 0.35  AORTA Ao Root diam: 3.30 cm Ao Asc diam:  3.30 cm MITRAL VALVE                TRICUSPID VALVE MV Area (PHT): 4.96 cm     TR Peak grad:   23.4 mmHg MV Area VTI:   2.24 cm     TR Vmax:        242.00 cm/s MV Peak grad:  13.5 mmHg MV Mean grad:  6.0 mmHg     SHUNTS MV Vmax:       1.84 m/s     Systemic VTI:  0.22 m MV Vmean:      116.0 cm/s   Systemic Diam: 2.20 cm MV Decel Time: 153 msec MV E velocity: 89.10 cm/s MV A velocity: 145.00 cm/s MV E/A ratio:  0.61 Sheryle Donning MD Electronically signed by Sheryle Donning MD Signature Date/Time: 03/03/2024/3:09:57 PM    Final    DG Chest Port 1 View Result Date: 03/03/2024 CLINICAL DATA:  Cough and fever EXAM: PORTABLE CHEST 1 VIEW COMPARISON:  12/15/2023 FINDINGS: Cardiac shadow is stable. Elevation the right hemidiaphragm is again seen and stable. No focal infiltrate or effusion is noted. No bony abnormality is noted. IMPRESSION: No active disease. Electronically Signed   By: Violeta Grey M.D.   On: 03/03/2024 03:21    Cardiac Studies   Echo 03/03/2024 IMPRESSIONS     1. Left ventricular ejection fraction, by estimation, is 50 to 55%. The  left ventricle has low normal function. The left ventricle has no regional  wall motion abnormalities. There is moderate concentric left ventricular  hypertrophy. Left ventricular  diastolic  function could not be evaluated. There is nonspecific abnormal  septal motion.   2. Right ventricular systolic function is normal. The right ventricular  size is normal. There is normal pulmonary artery systolic pressure.   3. The mitral valve is degenerative. Trivial mitral valve regurgitation.  Mild mitral stenosis. Severe mitral annular calcification.   4. The aortic valve is calcified. There is moderate calcification of the  aortic valve. There is moderate thickening of the aortic valve. Aortic  valve regurgitation is not visualized. Moderate aortic valve stenosis.  Aortic valve mean gradient measures  23.0 mmHg.   5. The inferior vena cava is normal in size with greater than 50%  respiratory variability, suggesting right atrial pressure of 3 mmHg.   Comparison(s): Prior images reviewed side by side.   Conclusion(s)/Recommendation(s): LVEF overall unchanged from prior. No  clear focal wall motion abnormalities with contrast, though there is some  nonspecific septal motion. Moderate aortic stenosis, mild mitral stenosis.   Patient Profile     83 y.o. 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 Mikle Alexanders after presented with fatigue and urinary frequency.   Assessment & Plan    NSTEMI             - Unclear if type I versus type II.  Serial enzyme 294--> 224 --> 164.  Patient denies any chest pain.  EKG showed chronic left bundle branch block and sinus rhythm.  Patient had frequent PVCs and bigeminy on telemetry.  Her main complaint is increased urinary frequency.  On arrival, she had a low-grade fever 100.2.  She has received 2 doses of IV Rocephin .  She has been complaining of increased fatigue for the past month.  - Echo 03/03/2024 EF 50-55%,  moderate LVH, normal PASP, mild MS, trivial MR, moderate  AS             - pending coronary CT, however patient has fairly frequent PVCs. Discussed with Dr. Avanell Bob, will give a single dose of IV diltiazem to see if we can suppress PVC enough to do the coronary CT.    Urinary frequency: Per primary team.  He received 2 doses of IV Rocephin .  White blood cell count was mildly elevated at 12.1.  Tmax on arrival was 100.2.  Viral panel negative for COVID, RSV, influenza.  Urinalysis was negative for UTI.  Blood culture currently pending.   Hypertension: Blood pressure on arrival mildly elevated in the 150s.  Continue home medication.   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  Neurology was consulted at the time and believe the finding was due to poorly controlled high blood pressure.       For questions or updates, please contact [facility] Please consult [email or web address] for contact info under        Signed, [name], PA  03/04/2024, 8:03 AM    PT seen and examined   I agree with findings as noted by Rosetta Cons above  Pt is feeling some better    No CP  Breathing is OK  Lungs CTA  Cardiac RRR with skips  Ext are without edema  Tele:  SR with PVCs     1  Elevated troponin   PT without CP on admit (she says has some occasionally with food; R to L chest)     Trop elevations 200s, 100s    Echo normal WIth this and with some fatigue I would recomm CCTA   Schedule as outpt given ectopy    Scan will cover 1 beat, thereby wont be affected by ectopy  2  PVCs    Continues to have PVCs    REcomm:  Low dose b blocker   metoprolol 25 bid  WIll set up for outpt Zio patch to quantify Follow up in clinc   3  LBBB  Old finding    4  HTN  BP is labile   Still high at times   On metoprolol, avapro , hydrochlorothiazide, amlodipine   WIll follow as outpt      We will sign off for now     WIll make sure outpt appts are made  Please call with questions   Ola Berger MD

## 2024-03-04 NOTE — Progress Notes (Signed)
 1428 Code stroke cart activated, Inpt- LKW 1345, at 1400 pt was noted to have new onset aphasia. BP 128/64 HR 53, glucose 165. Pt with h/o ICH, HTN, DM, HLD, and CKD. Pt admitted for UTI and CP 1431 Dr Doretta Gant paged 1434 pt to CT 1435 Dr Doretta Gant on camera 1440 NIHSS started in CT- CTA ordered 1445 IV obtained by IV team in CT 1508 CTA negative- Dr Doretta Gant to speak with attending

## 2024-03-04 NOTE — Progress Notes (Addendum)
 Came to bedside to evaluate patient.  Per RN patient with change in mentation and level of alertness.  Eyes open and patient slow to respond to questions.  When asked her name she repeats the name of the IV team member placing her IV.  Code stoke called.  Patient with h/o TIA in 2023. Tele reviewed: PVCs, no fib Labs ordered Blood sugar pending CT pending. Neurology consult pending Katrina Parma DO  -d/c'd BP meds and will allow for permissive HTN while MRI pending  3:50-- speech  normal Family updated at bedside

## 2024-03-04 NOTE — Progress Notes (Unsigned)
 Enrolled patient for a 3 day Zio XT monitor to be mailed to patients home  [place]

## 2024-03-04 NOTE — Progress Notes (Signed)
 PROGRESS NOTE    [name]  ZOX:[id number] DOB: [birth year] DOA: 03/03/2024 PCP: [name], FNP    Brief Narrative:   [name] is a 83 y.o. female with medical history significant of abdominal bloating, abdominal pain, history of AKI, stage 3a CKD, carpal tunnel syndrome, atypical chest pain, community-acquired pneumonia, constipation, type 2 diabetes, diverticulosis, diverticulitis, frequent UTIs, history E. coli infection, GERD, hiatal hernia, H. pylori infection, colon polyps, history of palpitations, history of herpes zoster, hyperlipidemia, hypertension, hypokalemia, chronic right knee pain, lightheadedness, history of morbid obesity now class II, osteoarthritis, osteoporosis, right hip pain, history of pneumonia pruritus, pyoderma, paraspinal muscle spasm, subacute intracranial hemorrhage, history of CVA, transient speech disturbance, vaginal discharge, vitamin D  deficiency who has been recently treated for UTI and is presented to the emergency department with complaints of dysuria and frequency. Now issue is cough   Assessment and Plan: Infection: ?Acute cystitis vs URI Admit to PCU/inpatient. Continue ceftriaxone  2 g IVPB daily.  No urine culture was collected here as U/A unremarkable - PCP for urine culture and sensitivity-- patinet is from a [facility] office and no culture seen -now with cough- add azithromycin  and check NP swab and procalcitonin    Elevated troponin Cardiology consult appreciated. Started on beta-blockers. - coronary CT as an outpatinet     Essential hypertension -continue home meds     Aortic atherosclerosis (HCC)   Hyperlipidemia associated with type 2 diabetes mellitus (HCC) -statin     Gastroesophageal reflux disease without esophagitis Antiacid, H2 blocker or PPI as needed.     Hypertensive nephropathy   Chronic kidney disease, stage 3a (HCC) Monitor renal function electrolytes. Continue angiotensin receptor blocker.     OSA on  CPAP CPAP at bedtime.     Type 2 diabetes mellitus with hyperglycemia (HCC) -SSI     Mild protein malnutrition (HCC)      Transaminitis   Hyperbilirubinemia Recheck LFTs in the morning. Further workup depending on results.     Pseudohyponatremia -labs in AM     Class 2 obesity Current BMI 38.29 kg/m. Would benefit from lifestyle modifications. Follow-up closely with PCP and/or bariatric clinic.        DVT prophylaxis: SCDs Start: 03/03/24 0517    Code Status: Full Code Family Communication: at bedside  Disposition Plan:  Level of care: Progressive Status is: Observation Home in the AM if improved    Consultants:  cards   Subjective: Cough all night  Objective: Vitals:   03/04/24 0949 03/04/24 1011 03/04/24 1217 03/04/24 1220  BP: (!) 154/87 (!) 154/87 (!) 132/113 (!) 154/69  Pulse: 65  69 66  Resp:   (!) 24   Temp:   99.5 F (37.5 C)   TempSrc:   Oral   SpO2: 98%  96%   Weight:      Height:        Intake/Output Summary (Last 24 hours) at 03/04/2024 1303 Last data filed at 03/04/2024 0954 Gross per 24 hour  Intake 1289.54 ml  Output 750 ml  Net 539.54 ml   Filed Weights   03/03/24 1245 03/04/24 0438  Weight: 107.6 kg 105.9 kg    Examination:   General: Appearance:    Obese female in no acute distress     Lungs:     diminished, respirations unlabored  Heart:    Normal heart rate.    MS:   All extremities are intact.    Neurologic:   Awake, alert  Data Reviewed: I have personally reviewed following labs and imaging studies  CBC: Recent Labs  Lab 03/03/24 0345  WBC 12.1*  NEUTROABS 10.4*  HGB 13.2  HCT 39.9  MCV 88.9  PLT 181   Basic Metabolic Panel: Recent Labs  Lab 03/03/24 0345 03/03/24 0600  NA 134*  --   K 4.1  --   CL 102  --   CO2 22  --   GLUCOSE 198*  --   BUN 30*  --   CREATININE 1.09*  --   CALCIUM  8.3*  --   MG  --  1.7   GFR: Estimated Creatinine Clearance: 49.8 mL/min (A) (by C-G formula  based on SCr of 1.09 mg/dL (H)). Liver Function Tests: Recent Labs  Lab 03/03/24 0345  AST 54*  ALT 52*  ALKPHOS 41  BILITOT 1.6*  PROT 6.5  ALBUMIN 3.1*   No results for input(s): "LIPASE", "AMYLASE" in the last 168 hours. No results for input(s): "AMMONIA" in the last 168 hours. Coagulation Profile: No results for input(s): "INR", "PROTIME" in the last 168 hours. Cardiac Enzymes: No results for input(s): "CKTOTAL", "CKMB", "CKMBINDEX", "TROPONINI" in the last 168 hours. BNP (last 3 results) No results for input(s): "PROBNP" in the last 8760 hours. HbA1C: No results for input(s): "HGBA1C" in the last 72 hours. CBG: Recent Labs  Lab 03/03/24 1214 03/03/24 1630 03/03/24 2056 03/04/24 0759 03/04/24 1145  GLUCAP 168* 154* 165* 153* 168*   Lipid Profile: No results for input(s): "CHOL", "HDL", "LDLCALC", "TRIG", "CHOLHDL", "LDLDIRECT" in the last 72 hours. Thyroid  Function Tests: No results for input(s): "TSH", "T4TOTAL", "FREET4", "T3FREE", "THYROIDAB" in the last 72 hours. Anemia Panel: No results for input(s): "VITAMINB12", "FOLATE", "FERRITIN", "TIBC", "IRON", "RETICCTPCT" in the last 72 hours. Sepsis Labs: Recent Labs  Lab 03/03/24 0357 03/03/24 0524  LATICACIDVEN 1.3 0.9    Recent Results (from the past 240 hours)  Resp panel by RT-PCR (RSV, Flu A&B, Covid) Anterior Nasal Swab     Status: None   Collection Time: 03/03/24  3:40 AM   Specimen: Anterior Nasal Swab  Result Value Ref Range Status   SARS Coronavirus 2 by RT PCR NEGATIVE NEGATIVE Final    Comment: (NOTE) SARS-CoV-2 target nucleic acids are NOT DETECTED.  The SARS-CoV-2 RNA is generally detectable in upper respiratory specimens during the acute phase of infection. The lowest concentration of SARS-CoV-2 viral copies this assay can detect is 138 copies/mL. A negative result does not preclude SARS-Cov-2 infection and should not be used as the sole basis for treatment or other patient management  decisions. A negative result may occur with  improper specimen collection/handling, submission of specimen other than nasopharyngeal swab, presence of viral mutation(s) within the areas targeted by this assay, and inadequate number of viral copies(<138 copies/mL). A negative result must be combined with clinical observations, patient history, and epidemiological information. The expected result is Negative.  Fact Sheet for Patients:  BloggerCourse.com  Fact Sheet for Healthcare Providers:  SeriousBroker.it  This test is no t yet approved or cleared by the [place]  FDA and  has been authorized for detection and/or diagnosis of SARS-CoV-2 by FDA under an Emergency Use Authorization (EUA). This EUA will remain  in effect (meaning this test can be used) for the duration of the COVID-19 declaration under Section 564(b)(1) of the Act, 21 U.S.C.section 360bbb-3(b)(1), unless the authorization is terminated  or revoked sooner.       Influenza A by PCR NEGATIVE NEGATIVE Final   Influenza  B by PCR NEGATIVE NEGATIVE Final    Comment: (NOTE) The Xpert Xpress SARS-CoV-2/FLU/RSV plus assay is intended as an aid in the diagnosis of influenza from Nasopharyngeal swab specimens and should not be used as a sole basis for treatment. Nasal washings and aspirates are unacceptable for Xpert Xpress SARS-CoV-2/FLU/RSV testing.  Fact Sheet for Patients: BloggerCourse.com  Fact Sheet for Healthcare Providers: SeriousBroker.it  This test is not yet approved or cleared by the [place]  FDA and has been authorized for detection and/or diagnosis of SARS-CoV-2 by FDA under an Emergency Use Authorization (EUA). This EUA will remain in effect (meaning this test can be used) for the duration of the COVID-19 declaration under Section 564(b)(1) of the Act, 21 U.S.C. section 360bbb-3(b)(1), unless the  authorization is terminated or revoked.     Resp Syncytial Virus by PCR NEGATIVE NEGATIVE Final    Comment: (NOTE) Fact Sheet for Patients: BloggerCourse.com  Fact Sheet for Healthcare Providers: SeriousBroker.it  This test is not yet approved or cleared by the [place]  FDA and has been authorized for detection and/or diagnosis of SARS-CoV-2 by FDA under an Emergency Use Authorization (EUA). This EUA will remain in effect (meaning this test can be used) for the duration of the COVID-19 declaration under Section 564(b)(1) of the Act, 21 U.S.C. section 360bbb-3(b)(1), unless the authorization is terminated or revoked.  Performed at [facility], [address], [place], [place] [address]   Blood culture (routine x 2)     Status: None (Preliminary result)   Collection Time: 03/03/24  3:45 AM   Specimen: BLOOD  Result Value Ref Range Status   Specimen Description   Final    BLOOD SITE NOT SPECIFIED Performed at [facility], [address] [place], [place] [address]    Special Requests   Final    BOTTLES DRAWN AEROBIC AND ANAEROBIC Blood Culture results may not be optimal due to an inadequate volume of blood received in culture bottles Performed at [facility], [address] [place], [place] [address]    Culture   Final    NO GROWTH 1 DAY Performed at [facility], [address], [place], [place] [address]    Report Status PENDING  Incomplete  Blood culture (routine x 2)     Status: None (Preliminary result)   Collection Time: 03/03/24  4:30 AM   Specimen: BLOOD  Result Value Ref Range Status   Specimen Description   Final    BLOOD LEFT ANTECUBITAL Performed at [facility], [address] [place], [place] [address]    Special Requests   Final    BOTTLES DRAWN AEROBIC AND ANAEROBIC Blood Culture results may not be optimal due to an  inadequate volume of blood received in culture bottles Performed at [facility], [address] [place], [place] [address]    Culture   Final    NO GROWTH 1 DAY Performed at [facility], [address] [place], [place] [address]    Report Status PENDING  Incomplete         Radiology Studies: ECHOCARDIOGRAM COMPLETE Result Date: 03/03/2024    ECHOCARDIOGRAM REPORT   Patient Name:   [name] Date of Exam: 03/03/2024 Medical Rec #:  [id number]        Height:       67.0 in Accession #:    [id number]       Weight:  234.1 lb Date of Birth:  1940/12/05         BSA:          2.162 m Patient Age:    82 years         BP:           129/58 mmHg Patient Gender: F                HR:           82 bpm. Exam Location:  Inpatient Procedure: 2D Echo, Cardiac Doppler, Color Doppler and Intracardiac            Opacification Agent (Both Spectral and Color Flow Doppler were            utilized during procedure). Indications:    Elevated Troponin  History:        Patient has prior history of Echocardiogram examinations.                 Stroke, Signs/Symptoms:Shortness of Breath and Chest Pain; Risk                 Factors:Hypertension, Diabetes and Hyperlipidemia.  Sonographer:    Willey Harrier Referring Phys: [id number] JUSTIN B HOWERTER IMPRESSIONS  1. Left ventricular ejection fraction, by estimation, is 50 to 55%. The left ventricle has low normal function. The left ventricle has no regional wall motion abnormalities. There is moderate concentric left ventricular hypertrophy. Left ventricular diastolic function could not be evaluated. There is nonspecific abnormal septal motion.  2. Right ventricular systolic function is normal. The right ventricular size is normal. There is normal pulmonary artery systolic pressure.  3. The mitral valve is degenerative. Trivial mitral valve regurgitation. Mild mitral stenosis. Severe mitral annular calcification.  4. The aortic valve is calcified.  There is moderate calcification of the aortic valve. There is moderate thickening of the aortic valve. Aortic valve regurgitation is not visualized. Moderate aortic valve stenosis. Aortic valve mean gradient measures 23.0 mmHg.  5. The inferior vena cava is normal in size with greater than 50% respiratory variability, suggesting right atrial pressure of 3 mmHg. Comparison(s): Prior images reviewed side by side. Conclusion(s)/Recommendation(s): LVEF overall unchanged from prior. No clear focal wall motion abnormalities with contrast, though there is some nonspecific septal motion. Moderate aortic stenosis, mild mitral stenosis. FINDINGS  Left Ventricle: Left ventricular ejection fraction, by estimation, is 50 to 55%. The left ventricle has low normal function. The left ventricle has no regional wall motion abnormalities. Definity  contrast agent was given IV to delineate the left ventricular endocardial borders. The left ventricular internal cavity size was normal in size. There is moderate concentric left ventricular hypertrophy. Nonspecific abnormal septal motion. Left ventricular diastolic function could not be evaluated due to mitral annular calcification (moderate or greater). Left ventricular diastolic function could not be evaluated. Right Ventricle: The right ventricular size is normal. Right vetricular wall thickness was not well visualized. Right ventricular systolic function is normal. There is normal pulmonary artery systolic pressure. The tricuspid regurgitant velocity is 2.42 m/s, and with an assumed right atrial pressure of 3 mmHg, the estimated right ventricular systolic pressure is 26.4 mmHg. Left Atrium: Left atrial size was normal in size. Right Atrium: Right atrial size was normal in size. Pericardium: There is no evidence of pericardial effusion. Presence of epicardial fat layer. Mitral Valve: MVA 2.5 by PHT. The mitral valve is degenerative in appearance. Severe mitral annular calcification.  Trivial mitral valve regurgitation. Mild mitral valve stenosis. MV  peak gradient, 13.5 mmHg. The mean mitral valve gradient is 6.0 mmHg. Tricuspid Valve: The tricuspid valve is normal in structure. Tricuspid valve regurgitation is trivial. No evidence of tricuspid stenosis. Aortic Valve: The aortic valve is calcified. There is moderate calcification of the aortic valve. There is moderate thickening of the aortic valve. Aortic valve regurgitation is not visualized. Moderate aortic stenosis is present. Aortic valve mean gradient measures 23.0 mmHg. Aortic valve peak gradient measures 36.7 mmHg. Aortic valve area, by VTI measures 1.35 cm. Pulmonic Valve: The pulmonic valve was not well visualized. Pulmonic valve regurgitation is mild. No evidence of pulmonic stenosis. Aorta: The aortic root and ascending aorta are structurally normal, with no evidence of dilitation. Venous: The inferior vena cava is normal in size with greater than 50% respiratory variability, suggesting right atrial pressure of 3 mmHg. IAS/Shunts: The atrial septum is grossly normal.  LEFT VENTRICLE PLAX 2D LVIDd:         4.79 cm   Diastology LVIDs:         3.18 cm   LV e' medial:    4.79 cm/s LV PW:         1.70 cm   LV E/e' medial:  18.6 LV IVS:        1.80 cm   LV e' lateral:   7.07 cm/s LVOT diam:     2.20 cm   LV E/e' lateral: 12.6 LV SV:         82 LV SV Index:   38 LVOT Area:     3.80 cm  RIGHT VENTRICLE             IVC RV Basal diam:  3.20 cm     IVC diam: 2.00 cm RV S prime:     13.80 cm/s TAPSE (M-mode): 1.8 cm LEFT ATRIUM           Index        RIGHT ATRIUM           Index LA Vol (A2C): 44.9 ml 20.76 ml/m  RA Area:     14.60 cm LA Vol (A4C): 52.5 ml 24.28 ml/m  RA Volume:   35.60 ml  16.46 ml/m  AORTIC VALVE AV Area (Vmax):    1.41 cm AV Area (Vmean):   1.45 cm AV Area (VTI):     1.35 cm AV Vmax:           303.00 cm/s AV Vmean:          227.000 cm/s AV VTI:            0.613 m AV Peak Grad:      36.7 mmHg AV Mean Grad:      23.0  mmHg LVOT Vmax:         112.00 cm/s LVOT Vmean:        86.300 cm/s LVOT VTI:          0.217 m LVOT/AV VTI ratio: 0.35  AORTA Ao Root diam: 3.30 cm Ao Asc diam:  3.30 cm MITRAL VALVE                TRICUSPID VALVE MV Area (PHT): 4.96 cm     TR Peak grad:   23.4 mmHg MV Area VTI:   2.24 cm     TR Vmax:        242.00 cm/s MV Peak grad:  13.5 mmHg MV Mean grad:  6.0 mmHg     SHUNTS MV Vmax:  1.84 m/s     Systemic VTI:  0.22 m MV Vmean:      116.0 cm/s   Systemic Diam: 2.20 cm MV Decel Time: 153 msec MV E velocity: 89.10 cm/s MV A velocity: 145.00 cm/s MV E/A ratio:  0.61 Sheryle Donning MD Electronically signed by Sheryle Donning MD Signature Date/Time: 03/03/2024/3:09:57 PM    Final    DG Chest Port 1 View Result Date: 03/03/2024 CLINICAL DATA:  Cough and fever EXAM: PORTABLE CHEST 1 VIEW COMPARISON:  12/15/2023 FINDINGS: Cardiac shadow is stable. Elevation the right hemidiaphragm is again seen and stable. No focal infiltrate or effusion is noted. No bony abnormality is noted. IMPRESSION: No active disease. Electronically Signed   By: Violeta Grey M.D.   On: 03/03/2024 03:21        Scheduled Meds:  irbesartan   300 mg Oral Daily   And   amLODipine   10 mg Oral Daily   And   hydrochlorothiazide  12.5 mg Oral Daily   insulin  aspart  0-15 Units Subcutaneous TID WC   metoprolol tartrate  25 mg Oral BID   pantoprazole   40 mg Oral Daily   rosuvastatin   40 mg Oral Daily   Continuous Infusions:  azithromycin      cefTRIAXone  (ROCEPHIN )  IV 2 g (03/04/24 0637)     LOS: 0 days    Time spent: 45 minutes spent on chart review, discussion with nursing staff, consultants, updating family and interview/physical exam; more than 50% of that time was spent in counseling and/or coordination of care.    Enrigue Harvard, DO [facility] Available via Epic secure chat 7am-7pm After these hours, please refer to coverage provider listed on [email or web address] 03/04/2024, 1:03 PM

## 2024-03-04 NOTE — Progress Notes (Signed)
 This RN hung scheduled antibiotic, patient complained of IV discomfort. Site changed to opposite arm, after 15 minutes called to room by NT who stated patient was hallucinating. Patient stated she was seeing faces and animals in the room. Vital signs obtained, see flow sheet. Charge RN notified, charge RN to bedside to assess. Upon charge RN arrival patient was non verbal, rapid response RN called and at bedside. Rapid RN called code stroke, see rapid response RN note.

## 2024-03-04 NOTE — Significant Event (Signed)
 Rapid Response Event Note   Reason for Call :  New onset aphasia   Initial Focused Assessment:  Patient resting in bed alert, repeating "[name]" when spoken to. Unable to identify her name, age, location or common objects. CBG 165. Able to grip, equal bilaterally, able to lift upper extremities off bed without drift. Unable to lift lower extremities off bed, but wiggling toes bilaterally. No facial droop.  Pupils 3 mm equal reactive and brisk bilaterally. Tracks movement bilaterally. Last known well reported 1345.   Husband at bedside, reports at baseline she is able to care for herself, no deficets in speech. Husband reports she had prior TIA symptoms in 2024. CODE Stroke activated and order set placed, taken to CT and back to room by this RN and fourth floor staff.  On way back to room, patient able to have conversation with clear speech. Correctly identified family in room on arrival back.   Interventions:  CODE Stroke order set placed per rapid response protocol.  Stroke Cart activated.  Transferred back to 4th floor progressive per Neurology MD, okay with Q4 hr neuro checks at this time.   See flowsheet for vitals.   Plan of Care:  See above.   Event Summary:   MD Notified:  Mikle Alexanders MD paged prior to arrival  Call Time: 1418 Arrival Time: 1422 End Time: 1520   Catherin Closs, RN

## 2024-03-04 NOTE — Progress Notes (Signed)
   03/04/24 1549  Spiritual Encounters  Type of Visit Initial  Care provided to: Pt and family  Conversation partners present during encounter Physician;Nurse  Referral source Nurse (RN/NT/LPN)  Reason for visit Advance directives  OnCall Visit No   Responded to page for HCPOA. Provided education to patient, spouse and other family members present. Family will discuss and advise if they want to pursue. Forms left with patient.

## 2024-03-05 DIAGNOSIS — R509 Fever, unspecified: Secondary | ICD-10-CM | POA: Diagnosis not present

## 2024-03-05 DIAGNOSIS — R4701 Aphasia: Secondary | ICD-10-CM

## 2024-03-05 DIAGNOSIS — R350 Frequency of micturition: Secondary | ICD-10-CM | POA: Diagnosis not present

## 2024-03-05 DIAGNOSIS — R7989 Other specified abnormal findings of blood chemistry: Secondary | ICD-10-CM | POA: Diagnosis not present

## 2024-03-05 DIAGNOSIS — I493 Ventricular premature depolarization: Secondary | ICD-10-CM

## 2024-03-05 DIAGNOSIS — R300 Dysuria: Secondary | ICD-10-CM | POA: Diagnosis not present

## 2024-03-05 LAB — LIPID PANEL
Cholesterol: 168 mg/dL (ref 0–200)
HDL: 39 mg/dL — ABNORMAL LOW (ref >40)
LDL Cholesterol: 90 mg/dL (ref 0–99)
Total CHOL/HDL Ratio: 4.3 ratio
Triglycerides: 195 mg/dL — ABNORMAL HIGH (ref <150)
VLDL: 39 mg/dL (ref 0–40)

## 2024-03-05 LAB — HEMOGLOBIN A1C
Hgb A1c MFr Bld: 7.7 % — ABNORMAL HIGH (ref 4.8–5.6)
Mean Plasma Glucose: 174.29 mg/dL

## 2024-03-05 LAB — COMPREHENSIVE METABOLIC PANEL WITH GFR
ALT: 44 U/L (ref 0–44)
AST: 37 U/L (ref 15–41)
Albumin: 3.4 g/dL — ABNORMAL LOW (ref 3.5–5.0)
Alkaline Phosphatase: 46 U/L (ref 38–126)
Anion gap: 13 (ref 5–15)
BUN: 26 mg/dL — ABNORMAL HIGH (ref 8–23)
CO2: 22 mmol/L (ref 22–32)
Calcium: 8.8 mg/dL — ABNORMAL LOW (ref 8.9–10.3)
Chloride: 97 mmol/L — ABNORMAL LOW (ref 98–111)
Creatinine, Ser: 1.33 mg/dL — ABNORMAL HIGH (ref 0.44–1.00)
GFR, Estimated: 40 mL/min — ABNORMAL LOW (ref >60)
Glucose, Bld: 160 mg/dL — ABNORMAL HIGH (ref 70–99)
Potassium: 3.5 mmol/L (ref 3.5–5.1)
Sodium: 132 mmol/L — ABNORMAL LOW (ref 135–145)
Total Bilirubin: 1.3 mg/dL — ABNORMAL HIGH (ref 0.0–1.2)
Total Protein: 7.1 g/dL (ref 6.5–8.1)

## 2024-03-05 LAB — CBC
HCT: 45.3 % (ref 36.0–46.0)
Hemoglobin: 14.6 g/dL (ref 12.0–15.0)
MCH: 29.1 pg (ref 26.0–34.0)
MCHC: 32.2 g/dL (ref 30.0–36.0)
MCV: 90.2 fL (ref 80.0–100.0)
Platelets: 222 10*3/uL (ref 150–400)
RBC: 5.02 MIL/uL (ref 3.87–5.11)
RDW: 13.4 % (ref 11.5–15.5)
WBC: 7.6 10*3/uL (ref 4.0–10.5)
nRBC: 0 % (ref 0.0–0.2)

## 2024-03-05 LAB — GLUCOSE, CAPILLARY
Glucose-Capillary: 167 mg/dL — ABNORMAL HIGH (ref 70–99)
Glucose-Capillary: 236 mg/dL — ABNORMAL HIGH (ref 70–99)
Glucose-Capillary: 152 mg/dL — ABNORMAL HIGH (ref 70–99)
Glucose-Capillary: 153 mg/dL — ABNORMAL HIGH (ref 70–99)

## 2024-03-05 MED ORDER — LACTATED RINGERS IV SOLN: INTRAVENOUS | Status: AC

## 2024-03-05 NOTE — Progress Notes (Addendum)
 Report called to Clint, for transfer to  [address]. Transportation arranged with [facility].

## 2024-03-05 NOTE — Consult Note (Incomplete)
 [facility] [place]  Patient name: [name] MRN: [id number] DOB: 1941/08/20 Sex: female   REQUESTING PROVIDER:   Neurohospitalist   REASON FOR CONSULT:    Symptomatic left carotid stenosis  HISTORY OF PRESENT ILLNESS:   [name] is a 83 y.o. female, who presented to the emergency department on 03/03/2024 with dysuria.  Urinalysis was essentially unremarkable.  Her white count was 12,000.  She was seen by cardiology for elevated troponins.  Recommendation was for beta-blockers and coronary CTA.  On 5/6, the patient was noted to have a change in mentation and alertness.  A code stroke was called.  She was not a candidate for TNK due to prior intracranial hemorrhage.  She was aphasic.  She did not have any motor dysfunction.  Her symptoms spontaneously resolved.  It was felt that this was consistent with a stroke/TIA.  MRI was unremarkable.  She was found to have greater than 70% left carotid stenosis and carotid intervention has been recommended.  She has been started on dual antiplatelet therapy with aspirin  and Plavix .  She is on a statin.  The patient has a history of stroke with intracranial hemorrhage in December 2023.  This was felt to be related to hypertension  PAST MEDICAL HISTORY    Past Medical History:  Diagnosis Date   Abdominal bloating 02/17/2019   Abdominal pain    AKI (acute kidney injury) (HCC) 02/12/2023   Arthritis    cervical disc degeneration/ oa left knee, carpal tunnel rt wrist, adhesive capsulitis right shoulder, rt hand weakness; lumbar degeneration   Carpal tunnel syndrome    CARPAL TUNNEL SYNDROME, RIGHT 10/14/2010   Qualifier: Diagnosis of   By: Nasario Badder MD, Sallyanne Creamer        CHEST PAIN, ATYPICAL 07/01/2010   Qualifier: Diagnosis of   By: Rodrick Clapper MD, Jackqulyn Masse acquired pneumonia 05/06/2020   Complication of anesthesia 2006-at [facility]   breathing problems-no BP med given  prior to surgery;  hx of being very sleepy after colon surgery --  states no problems with last right total knee replacement 2013   Constipation    Diabetes mellitus without complication (HCC)    borderline - diet control   Diverticulitis hx of   Diverticulosis    E. coli infection    2020   Frequent UTI    hx of urethral injury during colon surgery - states frequent uti's since   GERD (gastroesophageal reflux disease)    Gout, unspecified 09/09/2007   Qualifier: Diagnosis of   By: Larrie Po MD, Wilmon Hashimoto        H. pylori infection 07/18/2010   H/O hiatal hernia    HIP PAIN, LEFT, CHRONIC 11/09/2010   Qualifier: Diagnosis of   By: Larrie Po MD, John E        History of colon polyps 08/26/2008   History of palpitations    in the past   History of shingles    has a lingering itching on back where shingles were   Hyperlipidemia    Hypertension    Hypokalemia 10/19/2022   KNEE PAIN, RIGHT, CHRONIC 10/14/2010   Qualifier: Diagnosis of   By: Nasario Badder MD, Sallyanne Creamer        Lightheaded 02/17/2019   Morbidly obese (HCC) 05/31/2011   bmi of 45     Numbness and tingling in right hand    pt. 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   Shortness of breath 09/14/2021   Shortness of breath 09/14/2021   Spasm of lumbar paraspinous muscle 06/17/2011   Stroke (HCC)    Stroke-like symptom    Subacute intracranial hemorrhage (HCC) 10/18/2022   Transient speech disturbance 04/18/2022   Urinary frequency 02/17/2019   Vaginal discharge 08/02/2011   Vitamin D  deficiency      FAMILY HISTORY   Family History  Problem Relation Age of Onset   Breast cancer Mother 51   Hypertension Mother    Prostate cancer  Father    Hypertension Father    Dementia Sister    Lung cancer Brother    Hypertension Brother    COPD Brother    Cancer Maternal Grandmother    Arthritis Sister    Hypertension Sister    Arthritis Sister    Hypertension Child    Hypertension Child    Hypertension Child    Colon polyps Neg Hx    Esophageal cancer Neg Hx    Rectal cancer Neg Hx    Stomach cancer Neg Hx    Colon cancer Neg Hx     SOCIAL HISTORY:   Social History   Socioeconomic History   Marital status: Married    Spouse name: Not on file   Number of children: 6   Years of education: Not on file   Highest education level: Master's degree (e.g., MA, MS, MEng, MEd, MSW, MBA)  Occupational History   Occupation: DESIGNER    Employer: [employer]  Tobacco Use   Smoking status: Never   Smokeless tobacco: Never  Vaping Use   Vaping status: Never Used  Substance and Sexual Activity   Alcohol use: No   Drug use: No   Sexual activity: Not Currently  Other Topics Concern   Not on file  Social History Narrative   The patient is married and has 6 children   Operates a business   No alcohol tobacco or drug use   Social Drivers of Corporate investment banker Strain: Low Risk  (10/02/2023)   Overall Financial Resource Strain (CARDIA)    Difficulty of Paying Living Expenses: Not hard at all  Food Insecurity: No Food Insecurity (03/03/2024)   Hunger Vital Sign    Worried About Running Out of Food in the Last Year: Never true    Ran Out of Food in the Last Year: Never true  Transportation Needs: No Transportation Needs (03/03/2024)   PRAPARE - Administrator, Civil Service (Medical): No    Lack of Transportation (Non-Medical): No  Physical Activity: Inactive (10/02/2023)   Exercise Vital Sign    Days of Exercise per Week: 0 days    Minutes of Exercise per Session: 0 min  Stress: No Stress Concern Present (10/02/2023)   [employer] of Occupational Health - Occupational Stress Questionnaire     Feeling of Stress : Only a little  Recent Concern: Stress - Stress Concern Present (09/19/2023)   [employer] of Occupational Health - Occupational Stress Questionnaire    Feeling of Stress : To some extent  Social Connections: Socially Integrated (03/03/2024)   Social Connection and Isolation Panel [NHANES]    Frequency  of Communication with Friends and Family: Twice a week    Frequency of Social Gatherings with Friends and Family: Once a week    Attends Religious Services: More than 4 times per year    Active Member of [employer] or Organizations: Yes    Attends Engineer, structural: More than 4 times per year    Marital Status: Married  Catering manager Violence: Not At Risk (03/03/2024)   Humiliation, Afraid, Rape, and Kick questionnaire    Fear of Current or Ex-Partner: No    Emotionally Abused: No    Physically Abused: No    Sexually Abused: No    ALLERGIES:    Allergies  Allergen Reactions   Bee Venom Hives   Latex Hives and Itching   Codeine Other (See Comments)    Hallucinations    Hydrocodone Other (See Comments)    hallucinations   Oxycodone  Other (See Comments)    hallucinations    CURRENT MEDICATIONS:    Current Facility-Administered Medications  Medication Dose Route Frequency Provider Last Rate Last Admin   acetaminophen  (TYLENOL ) tablet 650 mg  650 mg Oral Q6H PRN Howerter, Justin B, DO   650 mg at 03/03/24 2143   Or   acetaminophen  (TYLENOL ) suppository 650 mg  650 mg Rectal Q6H PRN Howerter, Justin B, DO       aspirin  chewable tablet 81 mg  81 mg Oral Daily Stack, Colleen M, MD   81 mg at 03/05/24 1610   Or   aspirin  suppository 300 mg  300 mg Rectal Daily Eleni Griffin, MD       benzonatate  (TESSALON ) capsule 100 mg  100 mg Oral BID PRN Vann, Jessica U, DO   100 mg at 03/05/24 0920   cefTRIAXone  (ROCEPHIN ) 2 g in sodium chloride  0.9 % 100 mL IVPB  2 g Intravenous Q24H Danice Dural, MD   Stopped at 03/05/24 [phone]   clopidogrel   (PLAVIX ) tablet 75 mg  75 mg Oral Daily Stack, Colleen M, MD   75 mg at 03/05/24 0920   insulin  aspart (novoLOG ) injection 0-15 Units  0-15 Units Subcutaneous TID WC Danice Dural, MD   3 Units at 03/05/24 1707   lactated ringers  infusion   Intravenous Continuous Rai, Ripudeep K, MD 100 mL/hr at 03/05/24 1526 Infusion Verify at 03/05/24 1526   Oral care mouth rinse  15 mL Mouth Rinse PRN Danice Dural, MD       pantoprazole  (PROTONIX ) EC tablet 40 mg  40 mg Oral Daily Vann, Jessica U, DO   40 mg at 03/05/24 5409   rosuvastatin  (CRESTOR ) tablet 40 mg  40 mg Oral Daily Danice Dural, MD   40 mg at 03/05/24 0920    REVIEW OF SYSTEMS:   [X]  denotes positive finding, [ ]  denotes negative finding Cardiac  Comments:  Chest pain or chest pressure:    Shortness of breath upon exertion:    Short of breath when lying flat:    Irregular heart rhythm:        Vascular    Pain in calf, thigh, or hip brought on by ambulation:    Pain in feet at night that wakes you up from your sleep:     Blood clot in your veins:    Leg swelling:         Pulmonary    Oxygen at home:    Productive cough:     Wheezing:         Neurologic  Sudden weakness in arms or legs:     Sudden numbness in arms or legs:     Sudden onset of difficulty speaking or slurred speech: x   Temporary loss of vision in one eye:     Problems with dizziness:         Gastrointestinal    Blood in stool:      Vomited blood:         Genitourinary    Burning when urinating:     Blood in urine:        Psychiatric    Major depression:         Hematologic    Bleeding problems:    Problems with blood clotting too easily:        Skin    Rashes or ulcers:        Constitutional    Fever or chills:     PHYSICAL EXAM:   Vitals:   03/05/24 0459 03/05/24 0924 03/05/24 1052 03/05/24 1058  BP:  129/64 (!) 84/63 (!) 124/55  Pulse:  62 71 66  Resp:  18    Temp:  98.2 F (36.8 C)    TempSrc:  Oral    SpO2:   97% 98%   Weight: 106.1 kg     Height:        GENERAL: The patient is a well-nourished female, in no acute distress. The vital signs are documented above. CARDIAC: There is a regular rate and rhythm.  VASCULAR: *** PULMONARY: Nonlabored respirations ABDOMEN: Soft and non-tender with normal pitched bowel sounds.  MUSCULOSKELETAL: There are no major deformities or cyanosis. NEUROLOGIC: No focal weakness or paresthesias are detected. SKIN: There are no ulcers or rashes noted. PSYCHIATRIC: The patient has a normal affect.  STUDIES:   I have reviewed the following:  CT angiogram neck: 1. No intracranial large vessel occlusion. 2. 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 3. Atherosclerotic disease in the carotid siphon regions with stenosis estimated at 30-50% on both sides. 4. Atherosclerotic disease in the right V4 segment with stenosis estimated at 50%. 5. Aortic atherosclerosis.  MRI brain: No evidence of acute intracranial abnormality.   ASSESSMENT and PLAN   Symptomatic left carotid stenosis: The patient has multiple medical comorbidities.  In addition her lesion is very high I do not think that she is a good surgical candidate and therefore I have recommended TCAR to address her symptomatic stenosis.  She has been started on dual antiplatelet therapy.  I discussed the risk of stroke and the details of the operation with the patient and she wishes to proceed  Cardiac: The patient came in with bundle branch block and mildly elevated troponins.  She was seen by cardiology.  I spoke with Dr. Avanell Bob and we will have cardiology reevaluate her tomorrow to see if we can proceed with left-sided TCAR on Friday.   Marti Slates, MD, FACS [facility] [phone] Pager [phone]

## 2024-03-05 NOTE — H&P (View-Only) (Signed)
 [facility] [place]  Patient name: [name] MRN: [id number] DOB: 1941/08/20 Sex: female   REQUESTING PROVIDER:   Neurohospitalist   REASON FOR CONSULT:    Symptomatic left carotid stenosis  HISTORY OF PRESENT ILLNESS:   [name] is a 83 y.o. female, who presented to the emergency department on 03/03/2024 with dysuria.  Urinalysis was essentially unremarkable.  Her white count was 12,000.  She was seen by cardiology for elevated troponins.  Recommendation was for beta-blockers and coronary CTA.  On 5/6, the patient was noted to have a change in mentation and alertness.  A code stroke was called.  She was not a candidate for TNK due to prior intracranial hemorrhage.  She was aphasic.  She did not have any motor dysfunction.  Her symptoms spontaneously resolved.  It was felt that this was consistent with a stroke/TIA.  MRI was unremarkable.  She was found to have greater than 70% left carotid stenosis and carotid intervention has been recommended.  She has been started on dual antiplatelet therapy with aspirin  and Plavix .  She is on a statin.  The patient has a history of stroke with intracranial hemorrhage in December 2023.  This was felt to be related to hypertension  PAST MEDICAL HISTORY    Past Medical History:  Diagnosis Date   Abdominal bloating 02/17/2019   Abdominal pain    AKI (acute kidney injury) (HCC) 02/12/2023   Arthritis    cervical disc degeneration/ oa left knee, carpal tunnel rt wrist, adhesive capsulitis right shoulder, rt hand weakness; lumbar degeneration   Carpal tunnel syndrome    CARPAL TUNNEL SYNDROME, RIGHT 10/14/2010   Qualifier: Diagnosis of   By: Nasario Badder MD, Sallyanne Creamer        CHEST PAIN, ATYPICAL 07/01/2010   Qualifier: Diagnosis of   By: Rodrick Clapper MD, Jackqulyn Masse acquired pneumonia 05/06/2020   Complication of anesthesia 2006-at [facility]   breathing problems-no BP med given  prior to surgery;  hx of being very sleepy after colon surgery --  states no problems with last right total knee replacement 2013   Constipation    Diabetes mellitus without complication (HCC)    borderline - diet control   Diverticulitis hx of   Diverticulosis    E. coli infection    2020   Frequent UTI    hx of urethral injury during colon surgery - states frequent uti's since   GERD (gastroesophageal reflux disease)    Gout, unspecified 09/09/2007   Qualifier: Diagnosis of   By: Larrie Po MD, Wilmon Hashimoto        H. pylori infection 07/18/2010   H/O hiatal hernia    HIP PAIN, LEFT, CHRONIC 11/09/2010   Qualifier: Diagnosis of   By: Larrie Po MD, John E        History of colon polyps 08/26/2008   History of palpitations    in the past   History of shingles    has a lingering itching on back where shingles were   Hyperlipidemia    Hypertension    Hypokalemia 10/19/2022   KNEE PAIN, RIGHT, CHRONIC 10/14/2010   Qualifier: Diagnosis of   By: Nasario Badder MD, Sallyanne Creamer        Lightheaded 02/17/2019   Morbidly obese (HCC) 05/31/2011   bmi of 45     Numbness and tingling in right hand    pt. 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   Shortness of breath 09/14/2021   Shortness of breath 09/14/2021   Spasm of lumbar paraspinous muscle 06/17/2011   Stroke (HCC)    Stroke-like symptom    Subacute intracranial hemorrhage (HCC) 10/18/2022   Transient speech disturbance 04/18/2022   Urinary frequency 02/17/2019   Vaginal discharge 08/02/2011   Vitamin D  deficiency      FAMILY HISTORY   Family History  Problem Relation Age of Onset   Breast cancer Mother 51   Hypertension Mother    Prostate cancer  Father    Hypertension Father    Dementia Sister    Lung cancer Brother    Hypertension Brother    COPD Brother    Cancer Maternal Grandmother    Arthritis Sister    Hypertension Sister    Arthritis Sister    Hypertension Child    Hypertension Child    Hypertension Child    Colon polyps Neg Hx    Esophageal cancer Neg Hx    Rectal cancer Neg Hx    Stomach cancer Neg Hx    Colon cancer Neg Hx     SOCIAL HISTORY:   Social History   Socioeconomic History   Marital status: Married    Spouse name: Not on file   Number of children: 6   Years of education: Not on file   Highest education level: Master's degree (e.g., MA, MS, MEng, MEd, MSW, MBA)  Occupational History   Occupation: DESIGNER    Employer: [employer]  Tobacco Use   Smoking status: Never   Smokeless tobacco: Never  Vaping Use   Vaping status: Never Used  Substance and Sexual Activity   Alcohol use: No   Drug use: No   Sexual activity: Not Currently  Other Topics Concern   Not on file  Social History Narrative   The patient is married and has 6 children   Operates a business   No alcohol tobacco or drug use   Social Drivers of Corporate investment banker Strain: Low Risk  (10/02/2023)   Overall Financial Resource Strain (CARDIA)    Difficulty of Paying Living Expenses: Not hard at all  Food Insecurity: No Food Insecurity (03/03/2024)   Hunger Vital Sign    Worried About Running Out of Food in the Last Year: Never true    Ran Out of Food in the Last Year: Never true  Transportation Needs: No Transportation Needs (03/03/2024)   PRAPARE - Administrator, Civil Service (Medical): No    Lack of Transportation (Non-Medical): No  Physical Activity: Inactive (10/02/2023)   Exercise Vital Sign    Days of Exercise per Week: 0 days    Minutes of Exercise per Session: 0 min  Stress: No Stress Concern Present (10/02/2023)   [employer] of Occupational Health - Occupational Stress Questionnaire     Feeling of Stress : Only a little  Recent Concern: Stress - Stress Concern Present (09/19/2023)   [employer] of Occupational Health - Occupational Stress Questionnaire    Feeling of Stress : To some extent  Social Connections: Socially Integrated (03/03/2024)   Social Connection and Isolation Panel [NHANES]    Frequency  of Communication with Friends and Family: Twice a week    Frequency of Social Gatherings with Friends and Family: Once a week    Attends Religious Services: More than 4 times per year    Active Member of [employer] or Organizations: Yes    Attends Engineer, structural: More than 4 times per year    Marital Status: Married  Catering manager Violence: Not At Risk (03/03/2024)   Humiliation, Afraid, Rape, and Kick questionnaire    Fear of Current or Ex-Partner: No    Emotionally Abused: No    Physically Abused: No    Sexually Abused: No    ALLERGIES:    Allergies  Allergen Reactions   Bee Venom Hives   Latex Hives and Itching   Codeine Other (See Comments)    Hallucinations    Hydrocodone Other (See Comments)    hallucinations   Oxycodone  Other (See Comments)    hallucinations    CURRENT MEDICATIONS:    Current Facility-Administered Medications  Medication Dose Route Frequency Provider Last Rate Last Admin   acetaminophen  (TYLENOL ) tablet 650 mg  650 mg Oral Q6H PRN Howerter, Justin B, DO   650 mg at 03/03/24 2143   Or   acetaminophen  (TYLENOL ) suppository 650 mg  650 mg Rectal Q6H PRN Howerter, Justin B, DO       aspirin  chewable tablet 81 mg  81 mg Oral Daily Stack, Colleen M, MD   81 mg at 03/05/24 1610   Or   aspirin  suppository 300 mg  300 mg Rectal Daily Eleni Griffin, MD       benzonatate  (TESSALON ) capsule 100 mg  100 mg Oral BID PRN Vann, Jessica U, DO   100 mg at 03/05/24 0920   cefTRIAXone  (ROCEPHIN ) 2 g in sodium chloride  0.9 % 100 mL IVPB  2 g Intravenous Q24H Danice Dural, MD   Stopped at 03/05/24 [phone]   clopidogrel   (PLAVIX ) tablet 75 mg  75 mg Oral Daily Stack, Colleen M, MD   75 mg at 03/05/24 0920   insulin  aspart (novoLOG ) injection 0-15 Units  0-15 Units Subcutaneous TID WC Danice Dural, MD   3 Units at 03/05/24 1707   lactated ringers  infusion   Intravenous Continuous Rai, Ripudeep K, MD 100 mL/hr at 03/05/24 1526 Infusion Verify at 03/05/24 1526   Oral care mouth rinse  15 mL Mouth Rinse PRN Danice Dural, MD       pantoprazole  (PROTONIX ) EC tablet 40 mg  40 mg Oral Daily Vann, Jessica U, DO   40 mg at 03/05/24 5409   rosuvastatin  (CRESTOR ) tablet 40 mg  40 mg Oral Daily Danice Dural, MD   40 mg at 03/05/24 0920    REVIEW OF SYSTEMS:   [X]  denotes positive finding, [ ]  denotes negative finding Cardiac  Comments:  Chest pain or chest pressure:    Shortness of breath upon exertion:    Short of breath when lying flat:    Irregular heart rhythm:        Vascular    Pain in calf, thigh, or hip brought on by ambulation:    Pain in feet at night that wakes you up from your sleep:     Blood clot in your veins:    Leg swelling:         Pulmonary    Oxygen at home:    Productive cough:     Wheezing:         Neurologic  Sudden weakness in arms or legs:     Sudden numbness in arms or legs:     Sudden onset of difficulty speaking or slurred speech: x   Temporary loss of vision in one eye:     Problems with dizziness:         Gastrointestinal    Blood in stool:      Vomited blood:         Genitourinary    Burning when urinating:     Blood in urine:        Psychiatric    Major depression:         Hematologic    Bleeding problems:    Problems with blood clotting too easily:        Skin    Rashes or ulcers:        Constitutional    Fever or chills:     PHYSICAL EXAM:   Vitals:   03/05/24 0459 03/05/24 0924 03/05/24 1052 03/05/24 1058  BP:  129/64 (!) 84/63 (!) 124/55  Pulse:  62 71 66  Resp:  18    Temp:  98.2 F (36.8 C)    TempSrc:  Oral    SpO2:   97% 98%   Weight: 106.1 kg     Height:        GENERAL: The patient is a well-nourished female, in no acute distress. The vital signs are documented above. CARDIAC: There is a regular rate and rhythm.  VASCULAR: *** PULMONARY: Nonlabored respirations ABDOMEN: Soft and non-tender with normal pitched bowel sounds.  MUSCULOSKELETAL: There are no major deformities or cyanosis. NEUROLOGIC: No focal weakness or paresthesias are detected. SKIN: There are no ulcers or rashes noted. PSYCHIATRIC: The patient has a normal affect.  STUDIES:   I have reviewed the following:  CT angiogram neck: 1. No intracranial large vessel occlusion. 2. 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 3. Atherosclerotic disease in the carotid siphon regions with stenosis estimated at 30-50% on both sides. 4. Atherosclerotic disease in the right V4 segment with stenosis estimated at 50%. 5. Aortic atherosclerosis.  MRI brain: No evidence of acute intracranial abnormality.   ASSESSMENT and PLAN   Symptomatic left carotid stenosis: The patient has multiple medical comorbidities.  In addition her lesion is very high I do not think that she is a good surgical candidate and therefore I have recommended TCAR to address her symptomatic stenosis.  She has been started on dual antiplatelet therapy.  I discussed the risk of stroke and the details of the operation with the patient and she wishes to proceed  Cardiac: The patient came in with bundle branch block and mildly elevated troponins.  She was seen by cardiology.  I spoke with Dr. Avanell Bob and we will have cardiology reevaluate her tomorrow to see if we can proceed with left-sided TCAR on Friday.   Marti Slates, MD, FACS [facility] [phone] Pager [phone]

## 2024-03-05 NOTE — Evaluation (Signed)
 Physical Therapy Evaluation Patient Details Name: [name] MRN: [id number] DOB: 1941-03-18 Today's Date: 03/05/2024  History of Present Illness  83 year old female who presented with dysuria. Patient was admitted with elevated troponin, NSTEMI, and urinary frequency. Code stroke was called on 5/6 with imaging unremarkable for acute findings. PMH: CVA, hyperlipidemia, DM II, HTN.  Clinical Impression  On eval, pt was Supv-CGA for mobility. She walked ~350 feet around the unit. O2>90% on RA during session, dyspnea 2/4. Pt reported mild lightheadedness during session. Per RN and chart review, pt to transfer to [facility] for procedure. Will plan to follow pt during her hospital stay. Recommending HHPT f/u at this time-will update recommendations as necessary.        If plan is discharge home, recommend the following: A little help with walking and/or transfers;A little help with bathing/dressing/bathroom;Assist for transportation;Help with stairs or ramp for entrance   Can travel by private vehicle        Equipment Recommendations None recommended by PT  Recommendations for Other Services       Functional Status Assessment Patient has had a recent decline in their functional status and demonstrates the ability to make significant improvements in function in a reasonable and predictable amount of time.     Precautions / Restrictions Precautions Precautions: Fall Restrictions Weight Bearing Restrictions Per Provider Order: No      Mobility  Bed Mobility               General bed mobility comments: sitting EOB    Transfers Overall transfer level: Needs assistance   Transfers: Sit to/from Stand Sit to Stand: Supervision                Ambulation/Gait Ambulation/Gait assistance: Contact guard assist Gait Distance (Feet): 350 Feet Assistive device: IV Pole Gait Pattern/deviations: Step-through pattern, Decreased stride length       General Gait Details: Dyspnea  2/4. O2 >90% on RA. Tolerated distance well with 2 standing rest breaks  Stairs            Wheelchair Mobility     Tilt Bed    Modified Rankin (Stroke Patients Only)       Balance Overall balance assessment: Needs assistance         Standing balance support: During functional activity Standing balance-Leahy Scale: Fair                               Pertinent Vitals/Pain Pain Assessment Pain Assessment: No/denies pain    Home Living Family/patient expects to be discharged to:: Private residence Living Arrangements: Spouse/significant other Available Help at Discharge: Family;Available 24 hours/day Type of Home: House Home Access: Stairs to enter Entrance Stairs-Rails: None Entrance Stairs-Number of Steps: 3   Home Layout: One level Home Equipment: Chiropractor (4 wheels);BSC/3in1;Shower seat      Prior Function Prior Level of Function : Independent/Modified Independent             Mobility Comments: independent. runs own business ADLs Comments: not driving anymore     Extremity/Trunk Assessment   Upper Extremity Assessment Upper Extremity Assessment: Defer to OT evaluation    Lower Extremity Assessment Lower Extremity Assessment: Generalized weakness    Cervical / Trunk Assessment Cervical / Trunk Assessment: Normal  Communication   Communication Communication: No apparent difficulties    Cognition Arousal: Alert Behavior During Therapy: WFL for tasks assessed/performed   PT - Cognitive impairments:  No apparent impairments                         Following commands: Intact       Cueing       General Comments      Exercises     Assessment/Plan    PT Assessment Patient needs continued PT services  PT Problem List Decreased strength;Decreased activity tolerance;Decreased balance;Decreased mobility;Decreased knowledge of use of DME       PT Treatment Interventions Gait training;DME  instruction;Functional mobility training;Therapeutic activities;Therapeutic exercise;Patient/family education;Balance training    PT Goals (Current goals can be found in the Care Plan section)  Acute Rehab PT Goals Patient Stated Goal: successful procedure PT Goal Formulation: With patient Time For Goal Achievement: 03/19/24 Potential to Achieve Goals: Good    Frequency Min 3X/week     Co-evaluation               AM-PAC PT "6 Clicks" Mobility  Outcome Measure Help needed turning from your back to your side while in a flat bed without using bedrails?: None Help needed moving from lying on your back to sitting on the side of a flat bed without using bedrails?: None Help needed moving to and from a bed to a chair (including a wheelchair)?: None Help needed standing up from a chair using your arms (e.g., wheelchair or bedside chair)?: None Help needed to walk in hospital room?: A Little Help needed climbing 3-5 steps with a railing? : A Little 6 Click Score: 22    End of Session Equipment Utilized During Treatment: Gait belt Activity Tolerance: Patient tolerated treatment well Patient left: in bed;with call bell/phone within reach;with family/visitor present   PT Visit Diagnosis: Muscle weakness (generalized) (M62.81);Difficulty in walking, not elsewhere classified (R26.2);Unsteadiness on feet (R26.81)    Time: 1478-2956 PT Time Calculation (min) (ACUTE ONLY): 33 min   Charges:   PT Evaluation $PT Eval Low Complexity: 1 Low PT Treatments $Gait Training: 8-22 mins PT General Charges $$ ACUTE PT VISIT: 1 Visit            Tanda Falter, PT Acute Rehabilitation  Office: [phone]

## 2024-03-05 NOTE — Progress Notes (Signed)
 Episode of aphasia yesterday, which spontaneously improved. Was evaluated by teleneurology and presentation felt consistent with stroke/TIA. MRI Brain with no stroke. Likely this was a TIA. CTA shows calcified atherosclerotic plaque in BL ICAs. Given suspicion for symptomatic L ICA stenosis, case discussed with [name] with Vascular surgery who requested Aspirin  and plavix  and statin and transfer to [facility] for TCAR this Friday. Vascular surgery team will follow along when patient gets to [facility].  Case discussed with Dr. Thelma Fire over secure chat.  [name] [facility]

## 2024-03-05 NOTE — Progress Notes (Signed)
 [facility]                                                                              [name], is a 83 y.o. female, DOB - 06-11-1941, ZOX:[id number] Admit date - 03/03/2024    Outpatient Primary MD for the patient is [name], FNP  LOS - 0  days  Chief Complaint  Patient presents with   Dysuria       Brief summary   Patient is a 83 year old female with CKD stage IIIa, N syndrome, diabetes mellitus type 2, diverticulosis, frequent UTIs, GERD, hiatal hernia, H. pylori infection, history of palpitations, herpes zoster, HTN, HLP history of CVA vitamin D  deficiency, recently treated for UTI and was prescribed Macrobid .  Presented to ED with complaints of dysuria and frequency, no flank pain or hematuria.   She was found to be febrile in the ED temp 100.2 F.  CBC showed WBCs 12.1 Flu, COVID, RSV PCR negative. Patient was admitted for further workup.  Chest x-ray showed no active disease + Elevated troponins, cardiology consulted  On 5/6, patient had a code stroke for acute aphasia, neurology was consulted and underwent full stroke workup  Assessment & Plan    Acute onset of aphasia on 03/04/2024.  TIA -On 5/6, patient had acute onset of aphasia at 1300, not eligible for TNKase secondary to history of intracranial hemorrhage.  Neurology was consulted - Underwent a stroke workup - MRI brain showed no evidence of acute intracranial abnormality - CTA head and neck showed no LVO, atherosclerotic disease in both carotid bifurcations with 70% stenosis of both proximal ICA.  Atherosclerotic disease in the carotid siphon regions with stenosis 30 to 50% on both sides, right V4 segment with stenosis 50%. - Per neurology, placed on on aspirin  and Plavix   - 2D echo showed EF of 50 to 55%, no regional WMA, moderate concentric LVH, diastolic function could not be evaluated, moderate aortic stenosis, mild mitral stenosis, nonspecific septal wall motion - Lipid panel showed  LDL 90, triglycerides 195, continue statin - Follow hemoglobin A1c - PT OT evaluation - Discussed with neurology, Dr Murvin Arthurs, suspect aphasia secondary to symptomatic L ICA stenosis, needs early revascularization.  Neurology discussed with vascular surgery, Dr. Sharmaine Dearth and was recommended to transfer to [facility] for evaluation and possible intervention   ?Acute cystitis  - No urine culture was collected here as U/A unremarkable - PCP for urine culture and sensitivity-- patinet is from a [facility] office and no culture seen, was placed on Macrobid  outpatient - Continue IV Rocephin      Elevated troponin/NSTEMI -No chest pain or shortness of breath. -  Seen by cardiology, Dr. Avanell Bob, 2D echo normal EF - Recommended outpatient coronary CTA - Currently on aspirin  Plavix  and statin.    Essential hypertension - Feeling dizzy today, will check orthostatic vitals -Hold antihypertensives.   - Placed on IV fluid hydration continue statin     Aortic atherosclerosis (HCC)   Hyperlipidemia associated with type 2 diabetes mellitus (HCC) -cont statin     Gastroesophageal reflux disease without esophagitis Antiacid, H2 blocker or PPI as needed.     Hypertensive  nephropathy   Chronic kidney disease, stage 3a (HCC) -Hold antihypertensives -Received 1 dose of Lasix  yesterday, today feeling dizzy, SBP in 80s -Will place on gentle hydration.     OSA on CPAP CPAP at bedtime.     Type 2 diabetes mellitus with hyperglycemia (HCC) -Continue sliding scale insulin  while inpatient  CBG (last 3)  Recent Labs    03/04/24 1612 03/04/24 2020 03/05/24 0735  GLUCAP 135* 233* 167*         Transaminitis,  Hyperbilirubinemia - Improving     Pseudohyponatremia -labs in AM -Currently stable   Obesity class II Estimated body mass index is 37.75 kg/m as calculated from the following:   Height as of this encounter: 5\' 6"  (1.676 m).   Weight as of this encounter: 106.1 kg.  Code Status: Full CODE  STATUS DVT Prophylaxis:  SCDs Start: 03/03/24 0517   Level of Care: Level of care: Telemetry Medical Family Communication: Updated patient's daughter, [name] on the phone Disposition Plan:      Remains inpatient appropriate: Transferred to [facility]   Procedures:  MRI brain, 2D echo, CTA head and neck  Consultants:   Cardiology Neurology  Antimicrobials:   Anti-infectives (From admission, onward)    Start     Dose/Rate Route Frequency Ordered Stop   03/04/24 1500  azithromycin  (ZITHROMAX ) tablet 500 mg  Status:  Discontinued        500 mg Oral Daily 03/04/24 1413 03/04/24 1429   03/04/24 1400  azithromycin  (ZITHROMAX ) 500 mg in sodium chloride  0.9 % 250 mL IVPB  Status:  Discontinued        500 mg 250 mL/hr over 60 Minutes Intravenous Every 24 hours 03/04/24 1301 03/04/24 1413   03/04/24 0700  cefTRIAXone  (ROCEPHIN ) 2 g in sodium chloride  0.9 % 100 mL IVPB        2 g 200 mL/hr over 30 Minutes Intravenous Every 24 hours 03/03/24 0829     03/04/24 0200  cefTRIAXone  (ROCEPHIN ) 1 g in sodium chloride  0.9 % 100 mL IVPB  Status:  Discontinued        1 g 200 mL/hr over 30 Minutes Intravenous Every 24 hours 03/03/24 0517 03/03/24 0829   03/03/24 0830  cefTRIAXone  (ROCEPHIN ) 1 g in sodium chloride  0.9 % 100 mL IVPB       Note to Pharmacy: Greater than 100 kg weight.   1 g 200 mL/hr over 30 Minutes Intravenous  Once 03/03/24 0829 03/03/24 0934   03/03/24 0445  cefTRIAXone  (ROCEPHIN ) 1 g in sodium chloride  0.9 % 100 mL IVPB        1 g 200 mL/hr over 30 Minutes Intravenous  Once 03/03/24 0443 03/03/24 0627          Medications  aspirin   81 mg Oral Daily   Or   aspirin   300 mg Rectal Daily   clopidogrel   75 mg Oral Daily   insulin  aspart  0-15 Units Subcutaneous TID WC   pantoprazole   40 mg Oral Daily   rosuvastatin   40 mg Oral Daily      Subjective:   Zelphia Kollias was seen and examined today.  This morning, feeling better, no acute complaints, aphasia improving. +  Dizziness.  No chest pain or shortness of breath, N/V/D/C.  No focal weakness  Objective:   Vitals:   03/05/24 0459 03/05/24 0924 03/05/24 1052 03/05/24 1058  BP:  129/64 (!) 84/63 (!) 124/55  Pulse:  62 71 66  Resp:  18  Temp:  98.2 F (36.8 C)    TempSrc:  Oral    SpO2:  97% 98%   Weight: 106.1 kg     Height:        Intake/Output Summary (Last 24 hours) at 03/05/2024 1130 Last data filed at 03/05/2024 1610 Gross per 24 hour  Intake 364.58 ml  Output 1400 ml  Net -1035.42 ml     Wt Readings from Last 3 Encounters:  03/05/24 106.1 kg  02/16/24 106.2 kg  12/18/23 106.2 kg     Exam General: Alert and oriented x 3, NAD Cardiovascular: S1 S2 auscultated,  RRR Respiratory: Clear to auscultation bilaterally, no wheezing Gastrointestinal: Soft, nontender, nondistended, + bowel sounds Ext: no pedal edema bilaterally Neuro: Strength 5/5 upper and lower extremities bilaterally Psych: Normal affect, pleasant    Data Reviewed:  I have personally reviewed following labs    CBC Lab Results  Component Value Date   WBC 7.6 03/05/2024   RBC 5.02 03/05/2024   HGB 14.6 03/05/2024   HCT 45.3 03/05/2024   MCV 90.2 03/05/2024   MCH 29.1 03/05/2024   PLT 222 03/05/2024   MCHC 32.2 03/05/2024   RDW 13.4 03/05/2024   LYMPHSABS 0.6 (L) 03/03/2024   MONOABS 0.7 03/03/2024   EOSABS 0.4 03/03/2024   BASOSABS 0.0 03/03/2024     Last metabolic panel Lab Results  Component Value Date   NA 132 (L) 03/05/2024   K 3.5 03/05/2024   CL 97 (L) 03/05/2024   CO2 22 03/05/2024   BUN 26 (H) 03/05/2024   CREATININE 1.33 (H) 03/05/2024   GLUCOSE 160 (H) 03/05/2024   GFRNONAA 40 (L) 03/05/2024   GFRAA 54 (L) 06/01/2020   CALCIUM  8.8 (L) 03/05/2024   PHOS 3.3 07/01/2010   PROT 7.1 03/05/2024   ALBUMIN 3.4 (L) 03/05/2024   LABGLOB 3.1 06/08/2023   AGRATIO 1.6 11/03/2021   AGRATIO 1.1 11/03/2021   BILITOT 1.3 (H) 03/05/2024   ALKPHOS 46 03/05/2024   AST 37 03/05/2024   ALT 44  03/05/2024   ANIONGAP 13 03/05/2024    CBG (last 3)  Recent Labs    03/04/24 1612 03/04/24 2020 03/05/24 0735  GLUCAP 135* 233* 167*      Coagulation Profile: No results for input(s): "INR", "PROTIME" in the last 168 hours.   Radiology Studies: I have personally reviewed the imaging studies  MR BRAIN WO CONTRAST Result Date: 03/04/2024 CLINICAL DATA:  Neuro deficit, acute, stroke suspected aphasia EXAM: MRI HEAD WITHOUT CONTRAST TECHNIQUE: Multiplanar, multiecho pulse sequences of the brain and surrounding structures were obtained without intravenous contrast. COMPARISON:  Same day CT head.  MRI head April 15, 24. FINDINGS: Brain: No acute infarction, acute hemorrhage, hydrocephalus, extra-axial collection or mass lesion. Small remote hemorrhage in the left posterior temoral-occipital region. Moderate T2/FLAIR hyperintensities the white matter, nonspecific but compatible with chronic microvascular ischemic disease. Vascular: Major arterial flow voids are maintained skull base. Skull and upper cervical spine: Normal marrow signal. Sinuses/Orbits: Clear sinuses.  No acute orbital findings. Other: No mastoid effusions. IMPRESSION: No evidence of acute intracranial abnormality. Electronically Signed   By: Stevenson Elbe M.D.   On: 03/04/2024 20:52   CT ANGIO HEAD NECK W WO CM (CODE STROKE) Result Date: 03/04/2024 CLINICAL DATA:  Neuro deficit, acute, stroke suspected.  Aphasia. EXAM: CT ANGIOGRAPHY HEAD AND NECK WITH AND WITHOUT CONTRAST TECHNIQUE: Multidetector CT imaging of the head and neck was performed using the standard protocol during bolus administration of intravenous contrast. Multiplanar CT image  reconstructions and MIPs were obtained to evaluate the vascular anatomy. Carotid stenosis measurements (when applicable) are obtained utilizing NASCET criteria, using the distal internal carotid diameter as the denominator. RADIATION DOSE REDUCTION: This exam was performed according to the  departmental dose-optimization program which includes automated exposure control, adjustment of the mA and/or kV according to patient size and/or use of iterative reconstruction technique. CONTRAST:  75mL OMNIPAQUE  IOHEXOL  350 MG/ML SOLN COMPARISON:  Head CT immediately prior FINDINGS: CTA NECK FINDINGS Aortic arch: Aortic atherosclerosis. Branching pattern is normal without flow limiting origin stenosis. Right carotid system: Common carotid artery widely patent to the bifurcation. Calcified plaque at the carotid bifurcation and ICA bulb. Minimal diameter of the proximal ICA is 1.5 mm. Compared to a more distal cervical ICA diameter of 5 mm, this indicates a 70% stenosis. Left carotid system: Common carotid artery is patent to the bifurcation. Soft and calcified plaque at the carotid bifurcation and ICA bulb. Minimal diameter of the proximal ICA is 1.5 mm. Compared to a distal cervical ICA diameter of 5 mm, this indicates a 70% stenosis. Vertebral arteries: There is plaque adjacent to both vertebral artery origins. No stenosis greater than 30%. The vessels are patent through the cervical region to the foramen magnum. Skeleton: Cervical spondylosis. Other neck: No mass or lymphadenopathy. Upper chest: Lung apices are clear. Review of the MIP images confirms the above findings CTA HEAD FINDINGS Anterior circulation: Both internal carotid arteries are patent through the skull base and siphon regions. There is siphon atherosclerosis with stenosis estimated at 30-50% on both sides. The anterior and middle cerebral vessels are patent. No large vessel occlusion. No aneurysm or vascular malformation. Posterior circulation: Both vertebral arteries are patent through the foramen magnum to the basilar artery. There is atherosclerotic disease in the right V4 segment with stenosis estimated at 50%. No basilar stenosis. Posterior circulation branch vessels are normal. Venous sinuses: Patent and normal. Anatomic variants: None  significant. Review of the MIP images confirms the above findings IMPRESSION: 1. No intracranial large vessel occlusion. 2. 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 3. Atherosclerotic disease in the carotid siphon regions with stenosis estimated at 30-50% on both sides. 4. Atherosclerotic disease in the right V4 segment with stenosis estimated at 50%. 5. Aortic atherosclerosis. Aortic Atherosclerosis (ICD10-I70.0). Electronically Signed   By: Bettylou Brunner M.D.   On: 03/04/2024 15:08   CT HEAD CODE STROKE WO CONTRAST Result Date: 03/04/2024 CLINICAL DATA:  Code stroke. Neuro deficit, acute, stroke suspected. EXAM: CT HEAD WITHOUT CONTRAST TECHNIQUE: Contiguous axial images were obtained from the base of the skull through the vertex without intravenous contrast. RADIATION DOSE REDUCTION: This exam was performed according to the departmental dose-optimization program which includes automated exposure control, adjustment of the mA and/or kV according to patient size and/or use of iterative reconstruction technique. COMPARISON:  05/25/2023 FINDINGS: Brain: No abnormality seen affecting the brainstem or cerebellum. Cerebral hemispheres show advanced chronic small-vessel ischemic changes of the white matter. No sign of acute infarction, mass lesion, hemorrhage, hydrocephalus or extra-axial collection. Vascular: There is atherosclerotic calcification of the major vessels at the base of the brain. Skull: Negative Sinuses/Orbits: Clear/normal Other: None ASPECTS (Alberta Stroke Program Early CT Score) - Ganglionic level infarction (caudate, lentiform nuclei, internal capsule, insula, M1-M3 cortex): 7 - Supraganglionic infarction (M4-M6 cortex): 3 Total score (0-10 with 10 being normal): 10 IMPRESSION: 1. No acute CT finding. Advanced chronic small-vessel ischemic changes of the cerebral hemispheric white  matter. 2. Aspects is 10. 3. These results were  communicated to Dr. Doretta Gant at 2:47 pm on 03/04/2024 by text page via the [facility] messaging system. Electronically Signed   By: Bettylou Brunner M.D.   On: 03/04/2024 14:49       Lazaro Isenhower M.D. [facility] 03/05/2024, 11:30 AM  Available via Epic secure chat 7am-7pm After 7 pm, please refer to night coverage provider listed on amion.
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
## 2024-03-05 NOTE — Progress Notes (Signed)
   03/05/24 2259  BiPAP/CPAP/SIPAP  Reason BIPAP/CPAP not in use Other(comment) (patient has stated she does not wear CPAP)  BiPAP/CPAP /SiPAP Vitals  Pulse Rate 67  Resp 18  SpO2 98 %  Bilateral Breath Sounds Clear;Diminished  MEWS Score/Color  MEWS Score 0  MEWS Score Color Green

## 2024-03-05 NOTE — Progress Notes (Signed)
 PT Cancellation Note  Patient Details Name: [name] MRN: [id number] DOB: 06/16/1941   Cancelled Treatment:    Reason Eval/Treat Not Completed: Medical issues which prohibited therapy--low BP per RN-will hold PT for now and check back later today.    Tanda Falter, PT Acute Rehabilitation  Office: [phone]

## 2024-03-05 NOTE — Progress Notes (Addendum)
 The patient was sitting on the daybed when she became dizzy/light headed and weak. VS assessed. Provider updated. Will reevaluate VS (orthostatic) after patient rests.   03/05/24 1052  Vitals  BP (!) 84/63  MAP (mmHg) 69  BP Location Right Arm  BP Method Automatic  Patient Position (if appropriate) Sitting  Pulse Rate 71  Pulse Rate Source Monitor  MEWS COLOR  MEWS Score Color Green  Oxygen Therapy  SpO2 98 %  MEWS Score  MEWS Temp 0  MEWS Systolic 1  MEWS Pulse 0  MEWS RR 0  MEWS LOC 0  MEWS Score 1

## 2024-03-06 ENCOUNTER — Inpatient Hospital Stay (HOSPITAL_COMMUNITY)

## 2024-03-06 DIAGNOSIS — Z1152 Encounter for screening for COVID-19: Secondary | ICD-10-CM | POA: Diagnosis not present

## 2024-03-06 DIAGNOSIS — I2489 Other forms of acute ischemic heart disease: Secondary | ICD-10-CM | POA: Diagnosis present

## 2024-03-06 DIAGNOSIS — G459 Transient cerebral ischemic attack, unspecified: Secondary | ICD-10-CM | POA: Diagnosis not present

## 2024-03-06 DIAGNOSIS — I10 Essential (primary) hypertension: Secondary | ICD-10-CM | POA: Diagnosis not present

## 2024-03-06 DIAGNOSIS — E1151 Type 2 diabetes mellitus with diabetic peripheral angiopathy without gangrene: Secondary | ICD-10-CM

## 2024-03-06 DIAGNOSIS — K219 Gastro-esophageal reflux disease without esophagitis: Secondary | ICD-10-CM | POA: Diagnosis present

## 2024-03-06 DIAGNOSIS — R17 Unspecified jaundice: Secondary | ICD-10-CM | POA: Diagnosis present

## 2024-03-06 DIAGNOSIS — I69391 Dysphagia following cerebral infarction: Secondary | ICD-10-CM

## 2024-03-06 DIAGNOSIS — E785 Hyperlipidemia, unspecified: Secondary | ICD-10-CM

## 2024-03-06 DIAGNOSIS — E66812 Obesity, class 2: Secondary | ICD-10-CM | POA: Diagnosis present

## 2024-03-06 DIAGNOSIS — Z7982 Long term (current) use of aspirin: Secondary | ICD-10-CM

## 2024-03-06 DIAGNOSIS — Z7984 Long term (current) use of oral hypoglycemic drugs: Secondary | ICD-10-CM

## 2024-03-06 DIAGNOSIS — N3000 Acute cystitis without hematuria: Secondary | ICD-10-CM | POA: Diagnosis present

## 2024-03-06 DIAGNOSIS — I6522 Occlusion and stenosis of left carotid artery: Secondary | ICD-10-CM | POA: Diagnosis present

## 2024-03-06 DIAGNOSIS — R7989 Other specified abnormal findings of blood chemistry: Secondary | ICD-10-CM | POA: Diagnosis not present

## 2024-03-06 DIAGNOSIS — I350 Nonrheumatic aortic (valve) stenosis: Secondary | ICD-10-CM | POA: Diagnosis present

## 2024-03-06 DIAGNOSIS — I700 Atherosclerosis of aorta: Secondary | ICD-10-CM

## 2024-03-06 DIAGNOSIS — R4701 Aphasia: Secondary | ICD-10-CM | POA: Diagnosis not present

## 2024-03-06 DIAGNOSIS — R079 Chest pain, unspecified: Secondary | ICD-10-CM

## 2024-03-06 DIAGNOSIS — E1165 Type 2 diabetes mellitus with hyperglycemia: Secondary | ICD-10-CM | POA: Diagnosis present

## 2024-03-06 DIAGNOSIS — E1122 Type 2 diabetes mellitus with diabetic chronic kidney disease: Secondary | ICD-10-CM | POA: Diagnosis present

## 2024-03-06 DIAGNOSIS — R9431 Abnormal electrocardiogram [ECG] [EKG]: Secondary | ICD-10-CM

## 2024-03-06 DIAGNOSIS — R1310 Dysphagia, unspecified: Secondary | ICD-10-CM | POA: Diagnosis not present

## 2024-03-06 DIAGNOSIS — G4733 Obstructive sleep apnea (adult) (pediatric): Secondary | ICD-10-CM

## 2024-03-06 DIAGNOSIS — E441 Mild protein-calorie malnutrition: Secondary | ICD-10-CM | POA: Diagnosis present

## 2024-03-06 DIAGNOSIS — I493 Ventricular premature depolarization: Secondary | ICD-10-CM | POA: Diagnosis not present

## 2024-03-06 DIAGNOSIS — I129 Hypertensive chronic kidney disease with stage 1 through stage 4 chronic kidney disease, or unspecified chronic kidney disease: Secondary | ICD-10-CM

## 2024-03-06 DIAGNOSIS — Z006 Encounter for examination for normal comparison and control in clinical research program: Secondary | ICD-10-CM | POA: Diagnosis not present

## 2024-03-06 DIAGNOSIS — E1169 Type 2 diabetes mellitus with other specified complication: Secondary | ICD-10-CM

## 2024-03-06 DIAGNOSIS — M109 Gout, unspecified: Secondary | ICD-10-CM | POA: Diagnosis present

## 2024-03-06 DIAGNOSIS — I2510 Atherosclerotic heart disease of native coronary artery without angina pectoris: Secondary | ICD-10-CM | POA: Diagnosis present

## 2024-03-06 DIAGNOSIS — N1831 Chronic kidney disease, stage 3a: Secondary | ICD-10-CM

## 2024-03-06 DIAGNOSIS — E119 Type 2 diabetes mellitus without complications: Secondary | ICD-10-CM | POA: Diagnosis not present

## 2024-03-06 LAB — GLUCOSE, CAPILLARY
Glucose-Capillary: 167 mg/dL — ABNORMAL HIGH (ref 70–99)
Glucose-Capillary: 115 mg/dL — ABNORMAL HIGH (ref 70–99)
Glucose-Capillary: 139 mg/dL — ABNORMAL HIGH (ref 70–99)
Glucose-Capillary: 166 mg/dL — ABNORMAL HIGH (ref 70–99)

## 2024-03-06 LAB — NM MYOCAR MULTI W/SPECT W/WALL MOTION / EF
Peak HR: 90 {beats}/min
Percent HR: 64 %
Rest HR: 68 {beats}/min

## 2024-03-06 LAB — CBC
HCT: 43.9 % (ref 36.0–46.0)
Hemoglobin: 14.8 g/dL (ref 12.0–15.0)
MCH: 29.2 pg (ref 26.0–34.0)
MCHC: 33.7 g/dL (ref 30.0–36.0)
MCV: 86.6 fL (ref 80.0–100.0)
Platelets: 213 10*3/uL (ref 150–400)
RBC: 5.07 MIL/uL (ref 3.87–5.11)
RDW: 13.3 % (ref 11.5–15.5)
WBC: 9.2 10*3/uL (ref 4.0–10.5)
nRBC: 0 % (ref 0.0–0.2)

## 2024-03-06 LAB — RENAL FUNCTION PANEL
Albumin: 3.3 g/dL — ABNORMAL LOW (ref 3.5–5.0)
Anion gap: 13 (ref 5–15)
BUN: 21 mg/dL (ref 8–23)
CO2: 23 mmol/L (ref 22–32)
Calcium: 9.2 mg/dL (ref 8.9–10.3)
Chloride: 103 mmol/L (ref 98–111)
Creatinine, Ser: 1.05 mg/dL — ABNORMAL HIGH (ref 0.44–1.00)
GFR, Estimated: 53 mL/min — ABNORMAL LOW (ref >60)
Glucose, Bld: 118 mg/dL — ABNORMAL HIGH (ref 70–99)
Phosphorus: 3.5 mg/dL (ref 2.5–4.6)
Potassium: 4.8 mmol/L (ref 3.5–5.1)
Sodium: 139 mmol/L (ref 135–145)

## 2024-03-06 LAB — SURGICAL PCR SCREEN
MRSA, PCR: NEGATIVE
Staphylococcus aureus: NEGATIVE

## 2024-03-06 MED ORDER — TECHNETIUM TC 99M TETROFOSMIN IV KIT
10.6000 | PACK | Freq: Once | INTRAVENOUS | Status: AC | PRN
  Administered 2024-03-06: 10.6 via INTRAVENOUS

## 2024-03-06 MED ORDER — AMINOPHYLLINE 25 MG/ML IV SOLN
INTRAVENOUS | Status: AC
  Filled 2024-03-06: qty 10

## 2024-03-06 MED ORDER — LACTATED RINGERS IV SOLN: INTRAVENOUS | Status: AC

## 2024-03-06 MED ORDER — TECHNETIUM TC 99M TETROFOSMIN IV KIT
32.0000 | PACK | Freq: Once | INTRAVENOUS | Status: AC | PRN
Start: 2024-03-06 — End: 2024-03-06
  Administered 2024-03-06: 32 via INTRAVENOUS

## 2024-03-06 MED ORDER — REGADENOSON 0.4 MG/5ML IV SOLN
0.4000 mg | Freq: Once | INTRAVENOUS | Status: AC
  Administered 2024-03-06: 0.4 mg via INTRAVENOUS

## 2024-03-06 MED ORDER — REGADENOSON 0.4 MG/5ML IV SOLN
INTRAVENOUS | Status: AC
  Filled 2024-03-06: qty 5

## 2024-03-06 NOTE — Plan of Care (Signed)
  Problem: Education: Goal: Ability to describe self-care measures that may prevent or decrease complications (Diabetes Survival Skills Education) will improve Outcome: Progressing Goal: Individualized Educational Video(s) Outcome: Progressing   Problem: Coping: Goal: Ability to adjust to condition or change in health will improve Outcome: Progressing   Problem: Fluid Volume: Goal: Ability to maintain a balanced intake and output will improve Outcome: Progressing   Problem: Health Behavior/Discharge Planning: Goal: Ability to identify and utilize available resources and services will improve Outcome: Progressing Goal: Ability to manage health-related needs will improve Outcome: Progressing   Problem: Metabolic: Goal: Ability to maintain appropriate glucose levels will improve Outcome: Progressing   Problem: Nutritional: Goal: Maintenance of adequate nutrition will improve Outcome: Progressing Goal: Progress toward achieving an optimal weight will improve Outcome: Progressing   Problem: Skin Integrity: Goal: Risk for impaired skin integrity will decrease Outcome: Progressing   Problem: Tissue Perfusion: Goal: Adequacy of tissue perfusion will improve Outcome: Progressing   Problem: Education: Goal: Knowledge of General Education information will improve Description: Including pain rating scale, medication(s)/side effects and non-pharmacologic comfort measures Outcome: Progressing   Problem: Health Behavior/Discharge Planning: Goal: Ability to manage health-related needs will improve Outcome: Progressing   Problem: Clinical Measurements: Goal: Ability to maintain clinical measurements within normal limits will improve Outcome: Progressing Goal: Will remain free from infection Outcome: Progressing Goal: Diagnostic test results will improve Outcome: Progressing Goal: Respiratory complications will improve Outcome: Progressing Goal: Cardiovascular complication will  be avoided Outcome: Progressing   Problem: Activity: Goal: Risk for activity intolerance will decrease Outcome: Progressing   Problem: Coping: Goal: Level of anxiety will decrease Outcome: Progressing   Problem: Elimination: Goal: Will not experience complications related to bowel motility Outcome: Progressing Goal: Will not experience complications related to urinary retention Outcome: Progressing   Problem: Pain Managment: Goal: General experience of comfort will improve and/or be controlled Outcome: Progressing   Problem: Safety: Goal: Ability to remain free from injury will improve Outcome: Progressing   Problem: Skin Integrity: Goal: Risk for impaired skin integrity will decrease Outcome: Progressing   Problem: Ischemic Stroke/TIA Tissue Perfusion: Goal: Complications of ischemic stroke/TIA will be minimized Outcome: Progressing   Problem: Coping: Goal: Will verbalize positive feelings about self Outcome: Progressing Goal: Will identify appropriate support needs Outcome: Progressing   Problem: Health Behavior/Discharge Planning: Goal: Ability to manage health-related needs will improve Outcome: Progressing Goal: Goals will be collaboratively established with patient/family Outcome: Progressing   Problem: Self-Care: Goal: Ability to participate in self-care as condition permits will improve Outcome: Progressing Goal: Verbalization of feelings and concerns over difficulty with self-care will improve Outcome: Progressing Goal: Ability to communicate needs accurately will improve Outcome: Progressing   Problem: Nutrition: Goal: Risk of aspiration will decrease Outcome: Progressing Goal: Dietary intake will improve Outcome: Progressing

## 2024-03-06 NOTE — Progress Notes (Signed)
 Pt tolerated lexiscan  without any complications. No c/o symptoms. PA [facility] at bedside. All questions answered and understanding verbalized by pt.

## 2024-03-06 NOTE — Progress Notes (Signed)
 PROGRESS NOTE    [name]  ZOX:[id number] DOB: 10/11/1941 DOA: 03/03/2024 PCP: [name], FNP    Chief Complaint  Patient presents with   Dysuria    Brief Narrative:  Patient is a 83 year old female with CKD stage IIIa, N syndrome, diabetes mellitus type 2, diverticulosis, frequent UTIs, GERD, hiatal hernia, H. pylori infection, history of palpitations, herpes zoster, HTN, HLP history of CVA vitamin D  deficiency, recently treated for UTI and was prescribed Macrobid .  Presented to ED with complaints of dysuria and frequency, no flank pain or hematuria.   She was found to be febrile in the ED temp 100.2 F.  CBC showed WBCs 12.1 Flu, COVID, RSV PCR negative. Patient was admitted for further workup.  Chest x-ray showed no active disease + Elevated troponins, cardiology consulted   On 5/6, patient had a code stroke for acute aphasia, neurology was consulted and underwent full stroke workup Workup concerning for TIA and symptomatic left ICA stenosis patient transferred to [facility] for evaluation by vascular surgery for TCAR.   Assessment & Plan:   Principal Problem:   Acute cystitis Active Problems:   Symptomatic stenosis of left carotid artery   Essential hypertension   Hyperlipidemia associated with type 2 diabetes mellitus (HCC)   Gastroesophageal reflux disease without esophagitis   Hypertensive nephropathy   Aortic atherosclerosis (HCC)   Chronic kidney disease, stage 3a (HCC)   OSA on CPAP   Elevated troponin   Type 2 diabetes mellitus with hyperglycemia (HCC)   Mild protein malnutrition (HCC)   Transaminitis   Pseudohyponatremia   Hyperbilirubinemia   Class 2 obesity   PVC's (premature ventricular contractions)   TIA (transient ischemic attack)  #1 TIA/symptomatic L ICA stenosis -Patient noted during the hospitalization to have a code stroke which was activated due to aphasia. - CT head done with no acute abnormality, small vessel disease. - CT  angiogram head and neck done with atherosclerosis disease at both carotid bifurcations with 70% stenosis of both proximal ICA.  Atherosclerotic disease in the carotid siphon regions with stenosis estimated at 30 to 50% on both sides.  Atherosclerotic disease in the right V4 segment with stenosis estimated at 50%. - MRI negative for any acute abnormalities. - 2D echo done with a EF of 50 to 55%, calcified aortic valve, mild mitral stenosis, moderate aortic valve stenosis.  Atrial septum grossly normal. - LDL noted at 90 .-A1c 7.7. - Patient noted to be on aspirin  81 mg daily prior to admission currently on aspirin  81 mg daily and Plavix  75 mg daily. - Neurology recommending dual antiplatelet therapy with aspirin  and Plavix  following carotid stent for 6 months and then Plavix  alone with risk factor modification. - PT OT/SLP. -Patient transferred to [facility] for evaluation by vascular surgery as aphasia was felt to be secondary to symptomatic left ICA stenosis needing revascularization. -Patient seen by vascular surgery and patient for probable TCAR tomorrow 03/07/2024 pending cardiology preop clearance. - Neurology following and appreciate input and recommendations. - Vascular surgery following and appreciate their input and recommendations.  2.  Acute cystitis -Patient noted to have been placed on Macrobid  in the outpatient setting for UTI, no cultures seen from PCPs office. - Repeat urinalysis done during this hospitalization negative. - Continue empiric IV Rocephin .  3.  Elevated troponin/non-STEMI -Felt likely secondary to demand ischemia in the setting of acute infection. - 2D echo done with a EF of 50 to 55%, calcified aortic valve, mild mitral stenosis, moderate aortic  valve stenosis.  Atrial septum grossly normal. - Patient seen by cardiology initially recommended outpatient coronary CTA. - Patient underwent Lexiscan  Myoview  stress test this morning. - Continue aspirin , Plavix ,  statin.  4.  Hypertension -Patient noted to have bouts of soft blood pressure/hypotension with systolic in the 80s with complaints of dizziness or lightheadedness on 03/05/2024 and as such antihypertensive medications held. - Continue to hold antihypertensive medications. -Gentle hydration. - Follow.  5.  GERD -Stable. - Continue PPI.  6.  Aortic atherosclerosis/hyperlipidemia -Continue statin.  7.  CKD stage IIIa -Stable.  8.  OSA -CPAP nightly.  9.  Diabetes mellitus type 2 -Hemoglobin A1c 7.7. - Patient states he was taken off diabetic medications and was being managed at home with diet. - CBG 167 this morning. - SSI.    DVT prophylaxis: SCDs Code Status: Full Family Communication: Updated patient, husband, daughter at bedside. Disposition: TBD  Status is: Inpatient Remains inpatient appropriate because: Severity of illness   Consultants:  Cardiology: Dr. Avanell Bob 03/03/2024 Neurology: Dr. Doretta Gant 03/04/2024 Vascular surgery: [name] 03/06/2024  Procedures:  Lexiscan  Myoview  stress test 03/06/2024 CT head 03/04/2024 CT angiogram head and neck 03/04/2024 2D echo 03/03/2024 MRI brain 03/04/2024 Chest x-ray 03/03/2024   Antimicrobials:  Anti-infectives (From admission, onward)    Start     Dose/Rate Route Frequency Ordered Stop   03/04/24 1500  azithromycin  (ZITHROMAX ) tablet 500 mg  Status:  Discontinued        500 mg Oral Daily 03/04/24 1413 03/04/24 1429   03/04/24 1400  azithromycin  (ZITHROMAX ) 500 mg in sodium chloride  0.9 % 250 mL IVPB  Status:  Discontinued        500 mg 250 mL/hr over 60 Minutes Intravenous Every 24 hours 03/04/24 1301 03/04/24 1413   03/04/24 0700  cefTRIAXone  (ROCEPHIN ) 2 g in sodium chloride  0.9 % 100 mL IVPB        2 g 200 mL/hr over 30 Minutes Intravenous Every 24 hours 03/03/24 0829     03/04/24 0200  cefTRIAXone  (ROCEPHIN ) 1 g in sodium chloride  0.9 % 100 mL IVPB  Status:  Discontinued        1 g 200 mL/hr over 30 Minutes Intravenous Every 24  hours 03/03/24 0517 03/03/24 0829   03/03/24 0830  cefTRIAXone  (ROCEPHIN ) 1 g in sodium chloride  0.9 % 100 mL IVPB       Note to Pharmacy: Greater than 100 kg weight.   1 g 200 mL/hr over 30 Minutes Intravenous  Once 03/03/24 0829 03/03/24 0934   03/03/24 0445  cefTRIAXone  (ROCEPHIN ) 1 g in sodium chloride  0.9 % 100 mL IVPB        1 g 200 mL/hr over 30 Minutes Intravenous  Once 03/03/24 0443 03/03/24 1610         Subjective: Patient sitting up in chair, husband and daughter at bedside.  Patient states resolution of expressive aphasia symptoms 10 to 12 minutes after onset.  Patient denies any chest pain or shortness of breath.  No abdominal pain.  No asymmetric weakness or numbness.  Noted to have just returned from Lexiscan  Myoview .  Overall feels well.  Objective: Vitals:   03/06/24 1218 03/06/24 1220 03/06/24 1222 03/06/24 1426  BP: 115/69 (!) 89/53 109/66 128/68  Pulse:      Resp: 16 (!) 23 (!) 22   Temp:    98 F (36.7 C)  TempSrc:    Oral  SpO2:      Weight:      Height:  Intake/Output Summary (Last 24 hours) at 03/06/2024 1723 Last data filed at 03/06/2024 1643 Gross per 24 hour  Intake 414.97 ml  Output --  Net 414.97 ml   Filed Weights   03/03/24 1245 03/04/24 0438 03/05/24 0459  Weight: 107.6 kg 105.9 kg 106.1 kg    Examination:  General exam: Appears calm and comfortable  Respiratory system: Clear to auscultation. Respiratory effort normal. Cardiovascular system: S1 & S2 heard, RRR. No JVD, murmurs, rubs, gallops or clicks. No pedal edema. Gastrointestinal system: Abdomen is nondistended, soft and nontender. No organomegaly or masses felt. Normal bowel sounds heard. Central nervous system: Alert and oriented.  Moving extremities spontaneously.  Sensation intact.  Gait not tested secondary to safety.  No focal neurological deficits. Extremities: Symmetric 5 x 5 power. Skin: No rashes, lesions or ulcers Psychiatry: Judgement and insight appear normal. Mood  & affect appropriate.     Data Reviewed: I have personally reviewed following labs and imaging studies  CBC: Recent Labs  Lab 03/03/24 0345 03/04/24 1805 03/05/24 0516 03/06/24 1411  WBC 12.1* 7.7 7.6 9.2  NEUTROABS 10.4*  --   --   --   HGB 13.2 14.1 14.6 14.8  HCT 39.9 43.2 45.3 43.9  MCV 88.9 89.1 90.2 86.6  PLT 181 192 222 213    Basic Metabolic Panel: Recent Labs  Lab 03/03/24 0345 03/03/24 0600 03/04/24 1805 03/05/24 0516 03/06/24 1411  NA 134*  --  137 132* 139  K 4.1  --  3.8 3.5 4.8  CL 102  --  101 97* 103  CO2 22  --  26 22 23   GLUCOSE 198*  --  124* 160* 118*  BUN 30*  --  29* 26* 21  CREATININE 1.09*  --  1.17* 1.33* 1.05*  CALCIUM  8.3*  --  8.9 8.8* 9.2  MG  --  1.7  --   --   --   PHOS  --   --   --   --  3.5    GFR: Estimated Creatinine Clearance: 51.7 mL/min (A) (by C-G formula based on SCr of 1.05 mg/dL (H)).  Liver Function Tests: Recent Labs  Lab 03/03/24 0345 03/05/24 0516 03/06/24 1411  AST 54* 37  --   ALT 52* 44  --   ALKPHOS 41 46  --   BILITOT 1.6* 1.3*  --   PROT 6.5 7.1  --   ALBUMIN 3.1* 3.4* 3.3*    CBG: Recent Labs  Lab 03/05/24 1656 03/05/24 2057 03/06/24 0607 03/06/24 1418 03/06/24 1611  GLUCAP 152* 153* 167* 115* 139*     Recent Results (from the past 240 hours)  Resp panel by RT-PCR (RSV, Flu A&B, Covid) Anterior Nasal Swab     Status: None   Collection Time: 03/03/24  3:40 AM   Specimen: Anterior Nasal Swab  Result Value Ref Range Status   SARS Coronavirus 2 by RT PCR NEGATIVE NEGATIVE Final    Comment: (NOTE) SARS-CoV-2 target nucleic acids are NOT DETECTED.  The SARS-CoV-2 RNA is generally detectable in upper respiratory specimens during the acute phase of infection. The lowest concentration of SARS-CoV-2 viral copies this assay can detect is 138 copies/mL. A negative result does not preclude SARS-Cov-2 infection and should not be used as the sole basis for treatment or other patient management  decisions. A negative result may occur with  improper specimen collection/handling, submission of specimen other than nasopharyngeal swab, presence of viral mutation(s) within the areas targeted by this assay, and  inadequate number of viral copies(<138 copies/mL). A negative result must be combined with clinical observations, patient history, and epidemiological information. The expected result is Negative.  Fact Sheet for Patients:  BloggerCourse.com  Fact Sheet for Healthcare Providers:  SeriousBroker.it  This test is no t yet approved or cleared by the [place]  FDA and  has been authorized for detection and/or diagnosis of SARS-CoV-2 by FDA under an Emergency Use Authorization (EUA). This EUA will remain  in effect (meaning this test can be used) for the duration of the COVID-19 declaration under Section 564(b)(1) of the Act, 21 U.S.C.section 360bbb-3(b)(1), unless the authorization is terminated  or revoked sooner.       Influenza A by PCR NEGATIVE NEGATIVE Final   Influenza B by PCR NEGATIVE NEGATIVE Final    Comment: (NOTE) The Xpert Xpress SARS-CoV-2/FLU/RSV plus assay is intended as an aid in the diagnosis of influenza from Nasopharyngeal swab specimens and should not be used as a sole basis for treatment. Nasal washings and aspirates are unacceptable for Xpert Xpress SARS-CoV-2/FLU/RSV testing.  Fact Sheet for Patients: BloggerCourse.com  Fact Sheet for Healthcare Providers: SeriousBroker.it  This test is not yet approved or cleared by the [place]  FDA and has been authorized for detection and/or diagnosis of SARS-CoV-2 by FDA under an Emergency Use Authorization (EUA). This EUA will remain in effect (meaning this test can be used) for the duration of the COVID-19 declaration under Section 564(b)(1) of the Act, 21 U.S.C. section 360bbb-3(b)(1), unless the  authorization is terminated or revoked.     Resp Syncytial Virus by PCR NEGATIVE NEGATIVE Final    Comment: (NOTE) Fact Sheet for Patients: BloggerCourse.com  Fact Sheet for Healthcare Providers: SeriousBroker.it  This test is not yet approved or cleared by the [place]  FDA and has been authorized for detection and/or diagnosis of SARS-CoV-2 by FDA under an Emergency Use Authorization (EUA). This EUA will remain in effect (meaning this test can be used) for the duration of the COVID-19 declaration under Section 564(b)(1) of the Act, 21 U.S.C. section 360bbb-3(b)(1), unless the authorization is terminated or revoked.  Performed at [facility], [address] [place], [place] [address]   Blood culture (routine x 2)     Status: None (Preliminary result)   Collection Time: 03/03/24  3:45 AM   Specimen: BLOOD  Result Value Ref Range Status   Specimen Description   Final    BLOOD SITE NOT SPECIFIED Performed at [facility], [address] [place], [place] [address]    Special Requests   Final    BOTTLES DRAWN AEROBIC AND ANAEROBIC Blood Culture results may not be optimal due to an inadequate volume of blood received in culture bottles Performed at [facility], [address] [place], [place] [address]    Culture   Final    NO GROWTH 3 DAYS Performed at [facility], [address], [place], [place] [address]    Report Status PENDING  Incomplete  Blood culture (routine x 2)     Status: None (Preliminary result)   Collection Time: 03/03/24  4:30 AM   Specimen: BLOOD  Result Value Ref Range Status   Specimen Description   Final    BLOOD LEFT ANTECUBITAL Performed at [facility], [address] [place], [place] [address]    Special Requests   Final    BOTTLES DRAWN AEROBIC AND ANAEROBIC Blood Culture results may not be optimal due to an  inadequate volume of blood received in culture bottles Performed at [facility], [address] [place], [place] [address]    Culture   Final    NO GROWTH 3 DAYS Performed at [facility], [address] [place], [place] [address]    Report Status PENDING  Incomplete  Respiratory (~20 pathogens) panel by PCR     Status: None   Collection Time: 03/04/24  1:03 PM   Specimen: Nasopharyngeal Swab; Respiratory  Result Value Ref Range Status   Adenovirus NOT DETECTED NOT DETECTED Final   Coronavirus 229E NOT DETECTED NOT DETECTED Final    Comment: (NOTE) The Coronavirus on the Respiratory Panel, DOES NOT test for the novel  Coronavirus (2019 nCoV)    Coronavirus HKU1 NOT DETECTED NOT DETECTED Final   Coronavirus NL63 NOT DETECTED NOT DETECTED Final   Coronavirus OC43 NOT DETECTED NOT DETECTED Final   Metapneumovirus NOT DETECTED NOT DETECTED Final   Rhinovirus / Enterovirus NOT DETECTED NOT DETECTED Final   Influenza A NOT DETECTED NOT DETECTED Final   Influenza B NOT DETECTED NOT DETECTED Final   Parainfluenza Virus 1 NOT DETECTED NOT DETECTED Final   Parainfluenza Virus 2 NOT DETECTED NOT DETECTED Final   Parainfluenza Virus 3 NOT DETECTED NOT DETECTED Final   Parainfluenza Virus 4 NOT DETECTED NOT DETECTED Final   Respiratory Syncytial Virus NOT DETECTED NOT DETECTED Final   Bordetella pertussis NOT DETECTED NOT DETECTED Final   Bordetella Parapertussis NOT DETECTED NOT DETECTED Final   Chlamydophila pneumoniae NOT DETECTED NOT DETECTED Final   Mycoplasma pneumoniae NOT DETECTED NOT DETECTED Final    Comment: Performed at [facility], [address], [place], [place] [address]         Radiology Studies: NM Myocar Multi W/Spect [place] Housekeeper Motion / EF Result Date: 03/06/2024 CLINICAL DATA:  83 year old female with chest pain. Abnormal EKG. Intermediate risk for coronary disease. EXAM: MYOCARDIAL IMAGING WITH SPECT (REST AND  PHARMACOLOGIC-STRESS) GATED LEFT VENTRICULAR WALL MOTION STUDY LEFT VENTRICULAR EJECTION FRACTION TECHNIQUE: Standard myocardial SPECT imaging was performed after resting intravenous injection of 10 mCi Tc-65m tetrofosmin . Subsequently, intravenous infusion of Lexiscan  was performed under the supervision of the Cardiology staff. At peak effect of the drug, 30 mCi Tc-53m tetrofosmin  was injected intravenously and standard myocardial SPECT imaging was performed. Quantitative gated imaging was also performed to evaluate left ventricular wall motion, and estimate left ventricular ejection fraction. COMPARISON:  None Available. FINDINGS: Perfusion: Medium size region moderate decreased counts in the apical and mid segment of the anterior septal wall which are fixed on rest and stress. No evidence reversible ischemia. No ventricular dilatation. Wall Motion: Normal left ventricular wall motion. No left ventricular dilation. Left Ventricular Ejection Fraction: 38 % End diastolic volume 76 ml End systolic volume 46 ml IMPRESSION: 1. 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 No evidence reversible ischemia. 2. Normal left ventricular wall motion. 3. Left ventricular ejection fraction 38% 4. Non invasive risk stratification*: High ( primarily based on low ejection fraction). *2012 Appropriate Use Criteria for Coronary Revascularization Focused Update: J Am Coll Cardiol. 2012;59(9):857-881. http://content.dementiazones.com.aspx?articleid=[email or web address] Electronically Signed   By: Deboraha Fallow M.D.   On: 03/06/2024 16:04        Scheduled Meds:  aspirin   81 mg Oral Daily   Or   aspirin   300 mg Rectal Daily   clopidogrel   75 mg Oral Daily   insulin  aspart  0-15 Units Subcutaneous TID WC  pantoprazole   40 mg Oral Daily   rosuvastatin   40 mg Oral Daily   Continuous Infusions:  cefTRIAXone  (ROCEPHIN )  IV Stopped (03/06/24 0813)   lactated ringers        LOS: 0 days    Time spent: 40  minutes    Hilda Lovings, MD [facility]   To contact the attending provider between 7A-7P or the covering provider during after hours 7P-7A, please log into the web site [email or web address] and access using universal [facility] password for that web site. If you do not have the password, please call the hospital operator.  03/06/2024, 5:23 PM

## 2024-03-06 NOTE — Progress Notes (Signed)
 SLP Cancellation Note  Patient Details Name: CALEN NALLY MRN: [id number] DOB: 1941/02/23   Cancelled treatment:       Reason Eval/Treat Not Completed: Patient at procedure or test/unavailable. SLP will f/u as able.    Amil Kale, M.A., CCC-SLP Speech Language Pathology, Acute Rehabilitation Services  Secure Chat preferred [phone]  03/06/2024, 10:09 AM

## 2024-03-06 NOTE — Progress Notes (Signed)
 Rounding Note    Patient Name: [name] Date of Encounter: 03/06/2024  [facility] Cardiologist: Dr. Christean Courts of [facility]   Subjective   Pt denies CP  Breathing is OK   Inpatient Medications    Scheduled Meds:  aspirin   81 mg Oral Daily   Or   aspirin   300 mg Rectal Daily   clopidogrel   75 mg Oral Daily   insulin  aspart  0-15 Units Subcutaneous TID WC   pantoprazole   40 mg Oral Daily   rosuvastatin   40 mg Oral Daily   Continuous Infusions:  cefTRIAXone  (ROCEPHIN )  IV 2 g (03/06/24 0743)   PRN Meds: acetaminophen  **OR** acetaminophen , benzonatate , mouth rinse   Vital Signs    Vitals:   03/06/24 1218 03/06/24 1220 03/06/24 1222 03/06/24 1426  BP: 115/69 (!) 89/53 109/66 128/68  Pulse:      Resp: 16 (!) 23 (!) 22   Temp:    98 F (36.7 C)  TempSrc:    Oral  SpO2:      Weight:      Height:        Intake/Output Summary (Last 24 hours) at 03/06/2024 1458 Last data filed at 03/05/2024 1835 Gross per 24 hour  Intake 845.71 ml  Output --  Net 845.71 ml      03/05/2024    4:59 AM 03/04/2024    4:38 AM 03/03/2024   12:45 PM  Last 3 Weights  Weight (lbs) 233 lb 14.5 oz 233 lb 7.5 oz 237 lb 3.4 oz  Weight (kg) 106.1 kg 105.9 kg 107.6 kg      Telemetry    NSR with PVCs - Personally Reviewed    Physical Exam   GEN: No acute distress.   Neck: No JVD Cardiac: RRR, no murmurs Respiratory: Clear to auscultation MS: No edema;  N  Labs    High Sensitivity Troponin:   Recent Labs  Lab 03/03/24 0348 03/03/24 0600 03/03/24 1224  TROPONINIHS 294* 224* 164*     Chemistry Recent Labs  Lab 03/03/24 0345 03/03/24 0600 03/04/24 1805 03/05/24 0516  NA 134*  --  137 132*  K 4.1  --  3.8 3.5  CL 102  --  101 97*  CO2 22  --  26 22  GLUCOSE 198*  --  124* 160*  BUN 30*  --  29* 26*  CREATININE 1.09*  --  1.17* 1.33*  CALCIUM  8.3*  --  8.9 8.8*  MG  --  1.7  --   --   PROT 6.5  --   --  7.1  ALBUMIN 3.1*  --   --  3.4*  AST  54*  --   --  37  ALT 52*  --   --  44  ALKPHOS 41  --   --  46  BILITOT 1.6*  --   --  1.3*  GFRNONAA 51*  --  47* 40*  ANIONGAP 10  --  10 13    Lipids  Recent Labs  Lab 03/05/24 0516  CHOL 168  TRIG 195*  HDL 39*  LDLCALC 90  CHOLHDL 4.3    Hematology Recent Labs  Lab 03/03/24 0345 03/04/24 1805 03/05/24 0516  WBC 12.1* 7.7 7.6  RBC 4.49 4.85 5.02  HGB 13.2 14.1 14.6  HCT 39.9 43.2 45.3  MCV 88.9 89.1 90.2  MCH 29.4 29.1 29.1  MCHC 33.1 32.6 32.2  RDW 13.7 13.5 13.4  PLT 181 192 222  Thyroid  No results for input(s): "TSH", "FREET4" in the last 168 hours.  BNP Recent Labs  Lab 03/04/24 0941  BNP 157.0*    DDimer No results for input(s): "DDIMER" in the last 168 hours.   Radiology    MR BRAIN WO CONTRAST Result Date: 03/04/2024 CLINICAL DATA:  Neuro deficit, acute, stroke suspected aphasia EXAM: MRI HEAD WITHOUT CONTRAST TECHNIQUE: Multiplanar, multiecho pulse sequences of the brain and surrounding structures were obtained without intravenous contrast. COMPARISON:  Same day CT head.  MRI head April 15, 24. FINDINGS: Brain: No acute infarction, acute hemorrhage, hydrocephalus, extra-axial collection or mass lesion. Small remote hemorrhage in the left posterior temoral-occipital region. Moderate T2/FLAIR hyperintensities the white matter, nonspecific but compatible with chronic microvascular ischemic disease. Vascular: Major arterial flow voids are maintained skull base. Skull and upper cervical spine: Normal marrow signal. Sinuses/Orbits: Clear sinuses.  No acute orbital findings. Other: No mastoid effusions. IMPRESSION: No evidence of acute intracranial abnormality. Electronically Signed   By: Stevenson Elbe M.D.   On: 03/04/2024 20:52   CT ANGIO HEAD NECK W WO CM (CODE STROKE) Result Date: 03/04/2024 CLINICAL DATA:  Neuro deficit, acute, stroke suspected.  Aphasia. EXAM: CT ANGIOGRAPHY HEAD AND NECK WITH AND WITHOUT CONTRAST TECHNIQUE: Multidetector CT imaging of  the head and neck was performed using the standard protocol during bolus administration of intravenous contrast. Multiplanar CT image reconstructions and MIPs were obtained to evaluate the vascular anatomy. Carotid stenosis measurements (when applicable) are obtained utilizing NASCET criteria, using the distal internal carotid diameter as the denominator. RADIATION DOSE REDUCTION: This exam was performed according to the departmental dose-optimization program which includes automated exposure control, adjustment of the mA and/or kV according to patient size and/or use of iterative reconstruction technique. CONTRAST:  75mL OMNIPAQUE  IOHEXOL  350 MG/ML SOLN COMPARISON:  Head CT immediately prior FINDINGS: CTA NECK FINDINGS Aortic arch: Aortic atherosclerosis. Branching pattern is normal without flow limiting origin stenosis. Right carotid system: Common carotid artery widely patent to the bifurcation. Calcified plaque at the carotid bifurcation and ICA bulb. Minimal diameter of the proximal ICA is 1.5 mm. Compared to a more distal cervical ICA diameter of 5 mm, this indicates a 70% stenosis. Left carotid system: Common carotid artery is patent to the bifurcation. Soft and calcified plaque at the carotid bifurcation and ICA bulb. Minimal diameter of the proximal ICA is 1.5 mm. Compared to a distal cervical ICA diameter of 5 mm, this indicates a 70% stenosis. Vertebral arteries: There is plaque adjacent to both vertebral artery origins. No stenosis greater than 30%. The vessels are patent through the cervical region to the foramen magnum. Skeleton: Cervical spondylosis. Other neck: No mass or lymphadenopathy. Upper chest: Lung apices are clear. Review of the MIP images confirms the above findings CTA HEAD FINDINGS Anterior circulation: Both internal carotid arteries are patent through the skull base and siphon regions. There is siphon atherosclerosis with stenosis estimated at 30-50% on both sides. The anterior and  middle cerebral vessels are patent. No large vessel occlusion. No aneurysm or vascular malformation. Posterior circulation: Both vertebral arteries are patent through the foramen magnum to the basilar artery. There is atherosclerotic disease in the right V4 segment with stenosis estimated at 50%. No basilar stenosis. Posterior circulation branch vessels are normal. Venous sinuses: Patent and normal. Anatomic variants: None significant. Review of the MIP images confirms the above findings IMPRESSION: 1. No intracranial large vessel occlusion. 2. Atherosclerotic disease at both carotid bifurcations with 70% stenosis of both proximal internal carotid  arteries. 3. Atherosclerotic disease in the carotid siphon regions with stenosis estimated at 30-50% on both sides. 4. Atherosclerotic disease in the right V4 segment with stenosis estimated at 50%. 5. Aortic atherosclerosis. Aortic Atherosclerosis (ICD10-I70.0). Electronically Signed   By: Bettylou Brunner M.D.   On: 03/04/2024 15:08    Cardiac Studies   Echo 03/03/2024 IMPRESSIONS     1. Left ventricular ejection fraction, by estimation, is 50 to 55%. The  left ventricle has low normal function. The left ventricle has no regional  wall motion abnormalities. There is moderate concentric left ventricular  hypertrophy. Left ventricular  diastolic function could not be evaluated. There is nonspecific abnormal  septal motion.   2. Right ventricular systolic function is normal. The right ventricular  size is normal. There is normal pulmonary artery systolic pressure.   3. The mitral valve is degenerative. Trivial mitral valve regurgitation.  Mild mitral stenosis. Severe mitral annular calcification.   4. The aortic valve is calcified. There is moderate calcification of the  aortic valve. There is moderate thickening of the aortic valve. Aortic  valve regurgitation is not visualized. Moderate aortic valve stenosis.  Aortic valve mean gradient measures  23.0  mmHg.   5. The inferior vena cava is normal in size with greater than 50%  respiratory variability, suggesting right atrial pressure of 3 mmHg.   Comparison(s): Prior images reviewed side by side.   Conclusion(s)/Recommendation(s): LVEF overall unchanged from prior. No  clear focal wall motion abnormalities with contrast, though there is some  nonspecific septal motion. Moderate aortic stenosis, mild mitral stenosis.   Patient Profile     83 y.o. 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 Mikle Alexanders after presented with fatigue and urinary frequency.   Assessment & Plan      1  Elevated troponin   PT without CP on admit (she says has some occasionally with food; R to L chest)     Trop elevation peak at 294   EKG with old LBBB  Echo showed normal LVEF     Felt to most likely represent demand in setting of infection (pt had fever on admit)    Echo showed LVEF normal  Moderate LVH\  Pt did complain of fatigue, SOB with exertion   Not too active    Pt had PVCs on tele and   CT of chest showed coronary calcifications Plan initially was for outpt CCTA since pt not having CP and LVEF normal    2 Preop cardiac risk assessment  Now that  pt had TIA and is being eval for TCAR     Given above, prior to procedure have asked for lexiscan  myoview  to r/o large area of ischemia    Percutaneous procedure planned for carotid but need to assess preop in case there are any complications     2  PVCs    Continues to have PVCs on tele   Was on metoprolol earlier   Not now   Plan for outpt monitor to quantify burden of PVCs  3  LBBB  Old finding   4  HTN  BP is  a little low at times She is off of meds (home meds olmesartan /amlodipine Dottie Gearing)  5 Hyperlipidemia: On rosuvastatin           For questions or  updates, please  contact [facility] Please consult [email or web address] for contact info under        Signed, Ola Berger, MD  03/06/2024, 2:58 PM       We will sign off for now     WIll make sure outpt appts are made  Please call with questions   Ola Berger MD

## 2024-03-06 NOTE — Evaluation (Signed)
 Speech Language Pathology Evaluation Patient Details Name: [name] MRN: [id number] DOB: [birth year] Today's Date: 03/06/2024 Time: 4098-1191 SLP Time Calculation (min) (ACUTE ONLY): 31 min  Problem List:  Patient Active Problem List   Diagnosis Date Noted   PVC's (premature ventricular contractions) 03/06/2024   TIA (transient ischemic attack) 03/06/2024   Acute cystitis 03/03/2024   Elevated troponin 03/03/2024   Type 2 diabetes mellitus with hyperglycemia (HCC) 03/03/2024   Mild protein malnutrition (HCC) 03/03/2024   Transaminitis 03/03/2024   Pseudohyponatremia 03/03/2024   Hyperbilirubinemia 03/03/2024   Class 2 obesity 03/03/2024   Hypoglycemia 06/23/2023   History of TIA (transient ischemic attack) 02/12/2023   OSA on CPAP 10/19/2022   Urge incontinence 08/23/2022   Chronic kidney disease, stage 3a (HCC) 07/17/2021   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   Aortic atherosclerosis (HCC) 01/03/2021   Class 2 severe obesity due to excess calories with serious comorbidity and body mass index (BMI) of 39.0 to 39.9 in adult [facility]) 01/03/2021   Vitamin D  deficiency 06/01/2020   History of arthroplasty of right knee 05/11/2020   Constipation 06/26/2018   Type 2 diabetes mellitus with other specified complication (HCC) 06/19/2014   OA (osteoarthritis) of knee 01/05/2012   LBBB (left bundle branch block) 07/26/2010   Gastroesophageal reflux disease without esophagitis 10/06/2008   History of colonic polyps 08/26/2008   Hyperlipidemia associated with type 2 diabetes mellitus (HCC) 06/03/2007   Essential hypertension 06/03/2007   Past Medical History:  Past Medical History:  Diagnosis Date   Abdominal bloating 02/17/2019   Abdominal pain    AKI (acute kidney injury) (HCC) 02/12/2023   Arthritis    cervical disc degeneration/ oa left knee, carpal tunnel rt wrist, adhesive capsulitis right shoulder, rt hand weakness; lumbar degeneration   Carpal tunnel syndrome    CARPAL  TUNNEL SYNDROME, RIGHT 10/14/2010   Qualifier: Diagnosis of   By: Nasario Badder MD, Sallyanne Creamer        CHEST PAIN, ATYPICAL 07/01/2010   Qualifier: Diagnosis of   By: Rodrick Clapper MD, Jackqulyn Masse acquired pneumonia 05/06/2020   Complication of anesthesia 2006-at [facility]   breathing problems-no BP med given prior to surgery;  hx of being very sleepy after colon surgery --  states no problems with last right total knee replacement 2013   Constipation    Diabetes mellitus without complication (HCC)    borderline - diet control   Diverticulitis hx of   Diverticulosis    E. coli infection    2020   Frequent UTI    hx of urethral injury during colon surgery - states frequent uti's since   GERD (gastroesophageal reflux disease)    Gout, unspecified 09/09/2007   Qualifier: Diagnosis of   By: Larrie Po MD, Wilmon Hashimoto        H. pylori infection 07/18/2010   H/O hiatal hernia    HIP PAIN, LEFT, CHRONIC 11/09/2010   Qualifier: Diagnosis of   By: Larrie Po MD, Wilmon Hashimoto        History of colon polyps 08/26/2008   History of palpitations    in the past   History of shingles    has a lingering itching on back where shingles were   Hyperlipidemia    Hypertension    Hypokalemia 10/19/2022   KNEE PAIN, RIGHT, CHRONIC 10/14/2010   Qualifier: Diagnosis of   By: Nasario Badder MD, Sallyanne Creamer        Lightheaded 02/17/2019   Morbidly obese (HCC) 05/31/2011  bmi of 44     Numbness and tingling in right hand    pt. 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   Shortness of breath 09/14/2021   Shortness of breath 09/14/2021   Spasm of lumbar paraspinous muscle 06/17/2011   Stroke (HCC)    Stroke-like symptom    Subacute intracranial  hemorrhage (HCC) 10/18/2022   Transient speech disturbance 04/18/2022   Urinary frequency 02/17/2019   Vaginal discharge 08/02/2011   Vitamin D  deficiency    Past Surgical History:  Past Surgical History:  Procedure Laterality Date   ABDOMINAL HYSTERECTOMY     bladder tack     BREAST BIOPSY Left    BREAST SURGERY     breast duct resection- benign   CARPAL TUNNEL RELEASE Right 06/04/2014   Procedure: RIGHT CARPAL TUNNEL RELEASE;  Surgeon: Ronn Cohn, MD;  Location: [facility];  Service: Orthopedics;  Laterality: Right;   COLON RESECTION  2008   hx diverticulosis   COLON SURGERY     COLONOSCOPY     colonoscopy [id number]     JOINT REPLACEMENT     OOPHORECTOMY     POLYPECTOMY     skin graft left arm - traumatic compression injury left upper arm     temporary ureter stent     TOTAL KNEE ARTHROPLASTY  01/05/2012   Procedure: TOTAL KNEE ARTHROPLASTY;  Surgeon: Aurther Blue, MD;  Location: [facility];  Service: Orthopedics;  Laterality: Right;   TOTAL KNEE ARTHROPLASTY Left 08/17/2014   Procedure: LEFT TOTAL KNEE ARTHROPLASTY;  Surgeon: Aurther Blue, MD;  Location: [facility];  Service: Orthopedics;  Laterality: Left;   ureter repair for tyransected left ureter     HPI:  [name] is an 83 yo female presenting [facility] ED 5[facility]5 with complaints of dysuria and frequency. Code stroke activated 5/6 for aphasia. CTA showed 70% L ICA stenosis. MRI negative. Underwent EKG 5/8 with plans to complete a TCAR 5/9. Seen by SLP in 2023 for swallowing and completed an MBS, which was [facility]. 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 Word finding deficits appear infrequent  in spontaneous conversation and pt compensates well for these occurrences with circumlocution. She scored 18/30 on the SLUMS (a score of 27 or above is considered WFL) characterized by deficits related to memory and problem solving. She had significant difficulty completing simple calculations, which she reports is similar to baseline as her husband has always managed their finances. She also had difficulty with delayed recall, although note the delay was prolonged due to extensive time devoted to problem solving task preceeding memory task. She states she feels her current presentation is representative of her baseline and demonstrates excellent awareness of her deficits and need for increased assistance. Discussed f/u on an OP basis to address concerns, with which pt was agreeable. SLP will sign off acutely.  SLP Assessment  SLP Recommendation/Assessment: All further Speech Lanaguage Pathology  needs can be addressed in the next venue of care SLP Visit Diagnosis: Cognitive communication deficit (R41.841)    Recommendations for follow up therapy are one component of a multi-disciplinary discharge planning process, led by the attending physician.  Recommendations may be updated based on patient status, additional functional criteria and insurance authorization.    Follow Up Recommendations  Outpatient SLP    Assistance Recommended at Discharge  Intermittent Supervision/Assistance  Functional Status Assessment Patient has not had a recent decline in their functional status  Frequency and Duration           SLP Evaluation Cognition  Overall Cognitive Status: History of cognitive impairments - at baseline Arousal/Alertness: Awake/alert Orientation Level: Oriented X4 Attention: Sustained Sustained Attention: Appears intact Memory: Impaired Memory Impairment: Retrieval deficit Awareness: Appears intact Problem Solving: Impaired Problem Solving Impairment: Verbal basic Executive Function:  Sequencing Sequencing: Impaired Sequencing Impairment: Verbal basic       Comprehension  Auditory Comprehension Overall Auditory Comprehension: Appears within functional limits for tasks assessed    Expression Expression Primary Mode of Expression: Verbal Verbal Expression Overall Verbal Expression: Appears within functional limits for tasks assessed Written Expression Dominant Hand: Right   Oral / Motor  Oral Motor/Sensory Function Overall Oral Motor/Sensory Function: Within functional limits Motor Speech Overall Motor Speech: Appears within functional limits for tasks assessed            Amil Kale, M.A., CCC-SLP Speech Language Pathology, Acute Rehabilitation Services  Secure Chat preferred [phone]  03/06/2024, 4:14 PM

## 2024-03-06 NOTE — Progress Notes (Signed)
   Review of Lexiscan  myoview   Small to mid sized  fixed defect in distal anteroseptal wall   Otherwise normal perfusion  No ischemia  LVEF calculated at 38%    Echo on admit shows normal LVEF       Overall I think pt is low risk for major cardiac event and OK to proceed with planned procedure   WIll make sure she has outpt follow up Given myoview  findings would not pursue CT         Signed, Ola Berger, MD  03/06/2024, 4:45 PM

## 2024-03-06 NOTE — Progress Notes (Signed)
  Progress Note    03/06/2024 7:10 AM Hospital Day 3  Subjective:  no issues.  Says she doesn't really know what's going on.  Wants the doctor to speak with her and her family about surgery.    afebrile  Vitals:   03/05/24 1058 03/05/24 2259  BP: (!) 124/55   Pulse: 66 67  Resp:  18  Temp:    SpO2:  98%    Physical Exam: General:  no distress Lungs:  non labored   CBC    Component Value Date/Time   WBC 7.6 03/05/2024 0516   RBC 5.02 03/05/2024 0516   HGB 14.6 03/05/2024 0516   HGB CANCELED 07/12/2021 1045   HCT 45.3 03/05/2024 0516   HCT CANCELED 07/12/2021 1045   PLT 222 03/05/2024 0516   PLT CANCELED 07/12/2021 1045   MCV 90.2 03/05/2024 0516   MCV CANCELED 07/12/2021 1045   MCH 29.1 03/05/2024 0516   MCHC 32.2 03/05/2024 0516   RDW 13.4 03/05/2024 0516   RDW CANCELED 07/12/2021 1045   LYMPHSABS 0.6 (L) 03/03/2024 0345   LYMPHSABS 2.8 12/30/2020 1300   MONOABS 0.7 03/03/2024 0345   EOSABS 0.4 03/03/2024 0345   EOSABS 0.4 12/30/2020 1300   BASOSABS 0.0 03/03/2024 0345   BASOSABS 0.1 12/30/2020 1300    BMET    Component Value Date/Time   NA 132 (L) 03/05/2024 0516   NA 141 06/08/2023 1522   K 3.5 03/05/2024 0516   CL 97 (L) 03/05/2024 0516   CO2 22 03/05/2024 0516   GLUCOSE 160 (H) 03/05/2024 0516   BUN 26 (H) 03/05/2024 0516   BUN 21 06/08/2023 1522   CREATININE 1.33 (H) 03/05/2024 0516   CALCIUM  8.8 (L) 03/05/2024 0516   GFRNONAA 40 (L) 03/05/2024 0516   GFRAA 54 (L) 06/01/2020 1609    INR    Component Value Date/Time   INR 1.0 05/25/2023 1044     Intake/Output Summary (Last 24 hours) at 03/06/2024 0710 Last data filed at 03/05/2024 1835 Gross per 24 hour  Intake 845.71 ml  Output --  Net 845.71 ml     Assessment/Plan:  83 y.o. female with symptomatic left carotid artery stenosis  Hospital Day 3  -pt tentatively planned for left TCAR tomorrow pending cardiology evaluation.  -[name] to see pt today and discuss surgery in detail  with pt and her family.   Maryanna Smart, PA-C [facility] [phone] 03/06/2024 7:10 AM

## 2024-03-06 NOTE — Progress Notes (Signed)
     [name] presented for a  nuclear stress test today.  No immediate complications.  Stress imaging is pending at this time.  Preliminary EKG findings may be listed in the chart, but the stress test result will not be finalized until perfusion imaging is complete.  [name], PA  03/06/2024, 12:16 PM

## 2024-03-06 NOTE — Progress Notes (Addendum)
 STROKE TEAM PROGRESS NOTE   INTERIM HISTORY/SUBJECTIVE Transferred from [facility]- plan for a TCAR this Friday with [facility] ([name]). Will need cardiology clearance due to elevated troponin. Currently on DAPT with aspirin  and plavix .  Neurological exam is unchanged.  CT angiogram showed 70% left ICA stenosis.  MRI is negative for acute stroke. OBJECTIVE  CBC    Component Value Date/Time   WBC 7.6 03/05/2024 0516   RBC 5.02 03/05/2024 0516   HGB 14.6 03/05/2024 0516   HGB CANCELED 07/12/2021 1045   HCT 45.3 03/05/2024 0516   HCT CANCELED 07/12/2021 1045   PLT 222 03/05/2024 0516   PLT CANCELED 07/12/2021 1045   MCV 90.2 03/05/2024 0516   MCV CANCELED 07/12/2021 1045   MCH 29.1 03/05/2024 0516   MCHC 32.2 03/05/2024 0516   RDW 13.4 03/05/2024 0516   RDW CANCELED 07/12/2021 1045   LYMPHSABS 0.6 (L) 03/03/2024 0345   LYMPHSABS 2.8 12/30/2020 1300   MONOABS 0.7 03/03/2024 0345   EOSABS 0.4 03/03/2024 0345   EOSABS 0.4 12/30/2020 1300   BASOSABS 0.0 03/03/2024 0345   BASOSABS 0.1 12/30/2020 1300    BMET    Component Value Date/Time   NA 132 (L) 03/05/2024 0516   NA 141 06/08/2023 1522   K 3.5 03/05/2024 0516   CL 97 (L) 03/05/2024 0516   CO2 22 03/05/2024 0516   GLUCOSE 160 (H) 03/05/2024 0516   BUN 26 (H) 03/05/2024 0516   BUN 21 06/08/2023 1522   CREATININE 1.33 (H) 03/05/2024 0516   CALCIUM  8.8 (L) 03/05/2024 0516   EGFR 42 (L) 06/08/2023 1522   GFRNONAA 40 (L) 03/05/2024 0516    IMAGING past 24 hours No results found.  Vitals:   03/05/24 0924 03/05/24 1052 03/05/24 1058 03/05/24 2259  BP: 129/64 (!) 84/63 (!) 124/55   Pulse: 62 71 66 67  Resp: 18   18  Temp: 98.2 F (36.8 C)     TempSrc: Oral     SpO2: 97% 98%  98%  Weight:      Height:         PHYSICAL EXAM General:  Alert, well-nourished, well-developed patient in no acute distress Psych:  Mood and affect appropriate for situation CV: Regular rate and rhythm on monitor Respiratory:  Regular, unlabored  respirations on room air GI: Abdomen soft and nontender   NEURO:  Mental Status: AA&Ox3, patient is able to give clear and coherent history Speech/Language: speech is without dysarthria or aphasia.  Naming, repetition, fluency, and comprehension intact.  Cranial Nerves:  II: PERRL. Visual fields full.  III, IV, VI: EOMI. Eyelids elevate symmetrically.  V: Sensation is intact to light touch and symmetrical to face.  VII: Face is symmetrical resting and smiling VIII: hearing intact to voice. IX, X: Palate elevates symmetrically. Phonation is normal.  ZO:[id number] shrug 5/5. XII: tongue is midline without fasciculations. Motor: 5/5 strength to all muscle groups tested.  Tone: is normal and bulk is normal Sensation- Intact to light touch bilaterally. Extinction absent to light touch to DSS.   Coordination: FTN intact bilaterally, HKS: no ataxia in BLE.No drift.  Gait- deferred  Most Recent NIH 0     ASSESSMENT/PLAN  [name] is a 83 y.o. female with history of frequent UTIs, GERD, hyperlipidemia, hypertension, obesity, stroke, ICH who initially went to the ED for complaints of dysuria.  She was then found to have an elevated troponin and cardiology was consulted.  During her hospitalization a code stroke was activated  due to aphasia.  MRI was negative for any acute intracranial abnormality, however she was found to have left ICA stenosis.  She was transferred from [facility] to [facility] for further stroke workup and vascular surgery is planning on doing a TCAR on Friday.  TIA:   Etiology:  symptomatic L ICA stenosis   Code Stroke CT head No acute abnormality. Small vessel disease. ASPECTS 10.    CTA head & neck Atherosclerotic disease at both carotid bifurcations with 70% stenosis of both proximal internal carotid arteries. Atherosclerotic disease in the carotid siphon regions with stenosis estimated at 30-50% on both sides. Atherosclerotic disease in the  right V4 segment with stenosis estimated at 50%. MRI  No acute intracranial abnormality 2D Echo EF 50-55%, calcified aortic valve LDL 90 HgbA1c 7.7 VTE prophylaxis - SCDs aspirin  81 mg daily prior to admission, now on aspirin  81 mg daily and clopidogrel  75 mg daily; duration to be determined by vascular surgery team Therapy recommendations:  Pending Disposition:  pending  Hx of Stroke/TIA 03/2022 admitted for episode of expressive aphasia.  MRI negative for stroke.  CTA head and neck unremarkable.  LDL 98, A1c 6.4.  EF 50 to 55%.  Discharged on DAPT 09/2022 admitted for right occipital ICH.  CT head and neck no LVO, CTV no sinus thrombosis.  MRI stable hematoma with microhemorrhages.  12/04/2022 follow-up with Dr. Dee Farber he had LBN, restart aspirin  81  Hypertension Home meds: Hydralazine , losartan/amlodipine /hydrochlorothiazide Stable Blood Pressure Goal: BP less than 220/110   Hyperlipidemia Home meds: Crestor  40 mg, resumed in hospital LDL 90, goal < 70 Continue statin at discharge  Diabetes type II Uncontrolled Home meds: Farxiga  HgbA1c 7.7, goal < 7.0 CBGs SSI Recommend close follow-up with PCP for better DM control  Dysphagia Patient has post-stroke dysphagia, SLP consulted    Diet   Diet NPO time specified   Advance diet as tolerated  Other Stroke Risk Factors Obesity, Body mass index is 37.75 kg/m., BMI >/= 30 associated with increased stroke risk, recommend weight loss, diet and exercise as appropriate  Coronary artery disease Elevated troponin on arrival Cardiology following Lexiscan  done 5/8   Hospital day # 0  Patient seen and examined by NP/APP with MD. MD to update note as needed.   Imogene Mana, DNP, FNP-BC [facility] Pager: [phone]  I have personally obtained history,examined this patient, reviewed notes, independently viewed imaging studies, participated in medical decision making and plan of care.ROS completed by me personally  and pertinent positives fully documented  I have made any additions or clarifications directly to the above note. Agree with note above.  Patient presented with transient episode of expressive aphasia and CT angiogram shows moderate calcific left carotid stenosis.  Patient is scheduled for elective left carotid revascularization using TCAR procedure by vascular surgery tomorrow.  Continue aspirin  and Plavix  following carotid stent for 6 months and then Plavix  alone.  Aggressive risk factor modification.  Long discussion with patient and her husband and answered questions.  Greater than 50% time during this 50-minute visit was spent in counseling and coordination of care discussion with patient and care team and answering questions. Ardella Beaver, MD Medical Director [facility] Pager: [phone] 03/06/2024 3:43 PM   To contact Stroke Continuity provider, please refer to [email or web address]. After hours, contact General Neurology

## 2024-03-07 ENCOUNTER — Other Ambulatory Visit: Payer: Self-pay

## 2024-03-07 ENCOUNTER — Encounter (HOSPITAL_COMMUNITY)
Admission: EM | Disposition: A | Discharge status: Home / Self Care | Payer: Self-pay | Source: Home / Self Care | Attending: Internal Medicine

## 2024-03-07 ENCOUNTER — Inpatient Hospital Stay (HOSPITAL_COMMUNITY): Payer: Self-pay

## 2024-03-07 ENCOUNTER — Inpatient Hospital Stay (HOSPITAL_COMMUNITY)

## 2024-03-07 DIAGNOSIS — Z95828 Presence of other vascular implants and grafts: Secondary | ICD-10-CM

## 2024-03-07 DIAGNOSIS — N3000 Acute cystitis without hematuria: Secondary | ICD-10-CM | POA: Diagnosis not present

## 2024-03-07 DIAGNOSIS — I10 Essential (primary) hypertension: Secondary | ICD-10-CM

## 2024-03-07 DIAGNOSIS — E119 Type 2 diabetes mellitus without complications: Secondary | ICD-10-CM

## 2024-03-07 DIAGNOSIS — K219 Gastro-esophageal reflux disease without esophagitis: Secondary | ICD-10-CM | POA: Diagnosis not present

## 2024-03-07 DIAGNOSIS — G459 Transient cerebral ischemic attack, unspecified: Secondary | ICD-10-CM | POA: Diagnosis not present

## 2024-03-07 DIAGNOSIS — I6522 Occlusion and stenosis of left carotid artery: Secondary | ICD-10-CM | POA: Diagnosis not present

## 2024-03-07 DIAGNOSIS — I69391 Dysphagia following cerebral infarction: Secondary | ICD-10-CM | POA: Diagnosis not present

## 2024-03-07 DIAGNOSIS — R7989 Other specified abnormal findings of blood chemistry: Secondary | ICD-10-CM | POA: Diagnosis not present

## 2024-03-07 HISTORY — PX: TRANSCAROTID ARTERY REVASCULARIZATIONÂ: SHX6778

## 2024-03-07 LAB — RENAL FUNCTION PANEL
Albumin: 2.8 g/dL — ABNORMAL LOW (ref 3.5–5.0)
Anion gap: 11 (ref 5–15)
BUN: 18 mg/dL (ref 8–23)
CO2: 21 mmol/L — ABNORMAL LOW (ref 22–32)
Calcium: 8.7 mg/dL — ABNORMAL LOW (ref 8.9–10.3)
Chloride: 107 mmol/L (ref 98–111)
Creatinine, Ser: 1.04 mg/dL — ABNORMAL HIGH (ref 0.44–1.00)
GFR, Estimated: 54 mL/min — ABNORMAL LOW (ref >60)
Glucose, Bld: 134 mg/dL — ABNORMAL HIGH (ref 70–99)
Phosphorus: 3.4 mg/dL (ref 2.5–4.6)
Potassium: 3.9 mmol/L (ref 3.5–5.1)
Sodium: 139 mmol/L (ref 135–145)

## 2024-03-07 LAB — TYPE AND SCREEN
ABO/RH(D): A POS
Antibody Screen: NEGATIVE

## 2024-03-07 LAB — CBC
HCT: 38.1 % (ref 36.0–46.0)
Hemoglobin: 12.6 g/dL (ref 12.0–15.0)
MCH: 28.7 pg (ref 26.0–34.0)
MCHC: 33.1 g/dL (ref 30.0–36.0)
MCV: 86.8 fL (ref 80.0–100.0)
Platelets: 175 10*3/uL (ref 150–400)
RBC: 4.39 MIL/uL (ref 3.87–5.11)
RDW: 13.2 % (ref 11.5–15.5)
WBC: 8.6 10*3/uL (ref 4.0–10.5)
nRBC: 0 % (ref 0.0–0.2)

## 2024-03-07 LAB — GLUCOSE, CAPILLARY
Glucose-Capillary: 175 mg/dL — ABNORMAL HIGH (ref 70–99)
Glucose-Capillary: 144 mg/dL — ABNORMAL HIGH (ref 70–99)
Glucose-Capillary: 141 mg/dL — ABNORMAL HIGH (ref 70–99)
Glucose-Capillary: 107 mg/dL — ABNORMAL HIGH (ref 70–99)
Glucose-Capillary: 122 mg/dL — ABNORMAL HIGH (ref 70–99)

## 2024-03-07 LAB — MAGNESIUM: Magnesium: 1.8 mg/dL (ref 1.7–2.4)

## 2024-03-07 SURGERY — TRANSCAROTID ARTERY REVASCULARIZATION (TCAR)
Anesthesia: General | Laterality: Left

## 2024-03-07 MED ORDER — FENTANYL CITRATE (PF) 250 MCG/5ML IJ SOLN
INTRAMUSCULAR | Status: AC
  Filled 2024-03-07: qty 5

## 2024-03-07 MED ORDER — HEPARIN 6000 UNIT IRRIGATION SOLUTION
Status: DC | PRN
  Administered 2024-03-07: 1

## 2024-03-07 MED ORDER — SODIUM CHLORIDE 0.9 % IV SOLN: INTRAVENOUS | Status: AC

## 2024-03-07 MED ORDER — MORPHINE SULFATE (PF) 2 MG/ML IV SOLN: 2.0000 mg | INTRAVENOUS | Status: DC | PRN

## 2024-03-07 MED ORDER — ONDANSETRON HCL 4 MG/2ML IJ SOLN: 4.0000 mg | Freq: Four times a day (QID) | INTRAMUSCULAR | Status: DC | PRN

## 2024-03-07 MED ORDER — TRAMADOL HCL 50 MG PO TABS
50.0000 mg | ORAL_TABLET | Freq: Four times a day (QID) | ORAL | Status: DC | PRN
  Administered 2024-03-07: 50 mg via ORAL
  Filled 2024-03-07: qty 1

## 2024-03-07 MED ORDER — HEPARIN 6000 UNIT IRRIGATION SOLUTION
Status: AC
  Filled 2024-03-07: qty 500

## 2024-03-07 MED ORDER — GLYCOPYRROLATE 0.2 MG/ML IJ SOLN
INTRAMUSCULAR | Status: DC | PRN
  Administered 2024-03-07: .4 mg via INTRAVENOUS

## 2024-03-07 MED ORDER — SODIUM CHLORIDE 0.9 % IV SOLN: 500.0000 mL | Freq: Once | INTRAVENOUS | Status: DC | PRN

## 2024-03-07 MED ORDER — ONDANSETRON HCL 4 MG/2ML IJ SOLN
INTRAMUSCULAR | Status: DC | PRN
  Administered 2024-03-07: 4 mg via INTRAVENOUS

## 2024-03-07 MED ORDER — SUGAMMADEX SODIUM 200 MG/2ML IV SOLN
INTRAVENOUS | Status: DC | PRN
  Administered 2024-03-07: 200 mg via INTRAVENOUS

## 2024-03-07 MED ORDER — FENTANYL CITRATE (PF) 100 MCG/2ML IJ SOLN: 25.0000 ug | INTRAMUSCULAR | Status: DC | PRN

## 2024-03-07 MED ORDER — LABETALOL HCL 5 MG/ML IV SOLN: 10.0000 mg | INTRAVENOUS | Status: DC | PRN

## 2024-03-07 MED ORDER — FENTANYL CITRATE (PF) 250 MCG/5ML IJ SOLN
INTRAMUSCULAR | Status: DC | PRN
  Administered 2024-03-07: 100 ug via INTRAVENOUS

## 2024-03-07 MED ORDER — METOPROLOL TARTRATE 5 MG/5ML IV SOLN: 2.0000 mg | INTRAVENOUS | Status: DC | PRN

## 2024-03-07 MED ORDER — 0.9 % SODIUM CHLORIDE (POUR BTL) OPTIME
TOPICAL | Status: DC | PRN
  Administered 2024-03-07: 1000 mL

## 2024-03-07 MED ORDER — PHENYLEPHRINE HCL-NACL 20-0.9 MG/250ML-% IV SOLN
INTRAVENOUS | Status: DC | PRN
  Administered 2024-03-07: 50 ug/min via INTRAVENOUS

## 2024-03-07 MED ORDER — ORAL CARE MOUTH RINSE: 15.0000 mL | Freq: Once | OROMUCOSAL | Status: AC

## 2024-03-07 MED ORDER — HEPARIN SODIUM (PORCINE) 1000 UNIT/ML IJ SOLN
INTRAMUSCULAR | Status: DC | PRN
  Administered 2024-03-07: 3000 [IU] via INTRAVENOUS
  Administered 2024-03-07: 10000 [IU] via INTRAVENOUS

## 2024-03-07 MED ORDER — EPHEDRINE SULFATE-NACL 50-0.9 MG/10ML-% IV SOSY
PREFILLED_SYRINGE | INTRAVENOUS | Status: DC | PRN
  Administered 2024-03-07: 5 mg via INTRAVENOUS

## 2024-03-07 MED ORDER — PROPOFOL 10 MG/ML IV BOLUS
INTRAVENOUS | Status: AC
  Filled 2024-03-07: qty 20

## 2024-03-07 MED ORDER — CHLORHEXIDINE GLUCONATE 0.12 % MT SOLN
15.0000 mL | Freq: Once | OROMUCOSAL | Status: AC
  Administered 2024-03-07: 15 mL via OROMUCOSAL
  Filled 2024-03-07: qty 15

## 2024-03-07 MED ORDER — LABETALOL HCL 5 MG/ML IV SOLN
INTRAVENOUS | Status: DC | PRN
Start: 2024-03-07 — End: 2024-03-07
  Administered 2024-03-07: 5 mg via INTRAVENOUS

## 2024-03-07 MED ORDER — ROCURONIUM BROMIDE 10 MG/ML (PF) SYRINGE
PREFILLED_SYRINGE | INTRAVENOUS | Status: DC | PRN
  Administered 2024-03-07: 50 mg via INTRAVENOUS
  Administered 2024-03-07: 10 mg via INTRAVENOUS

## 2024-03-07 MED ORDER — LACTATED RINGERS IV SOLN: INTRAVENOUS | Status: DC

## 2024-03-07 MED ORDER — HYDRALAZINE HCL 20 MG/ML IJ SOLN: 5.0000 mg | INTRAMUSCULAR | Status: DC | PRN

## 2024-03-07 MED ORDER — PROPOFOL 10 MG/ML IV BOLUS
INTRAVENOUS | Status: DC | PRN
  Administered 2024-03-07: 130 mg via INTRAVENOUS

## 2024-03-07 MED ORDER — IODIXANOL 320 MG/ML IV SOLN
INTRAVENOUS | Status: DC | PRN
  Administered 2024-03-07: 9 mL via INTRA_ARTERIAL

## 2024-03-07 MED ORDER — PROTAMINE SULFATE 10 MG/ML IV SOLN
INTRAVENOUS | Status: DC | PRN
  Administered 2024-03-07 (×5): 10 mg via INTRAVENOUS

## 2024-03-07 MED ORDER — HEMOSTATIC AGENTS (NO CHARGE) OPTIME
TOPICAL | Status: DC | PRN
  Administered 2024-03-07: 1 via TOPICAL

## 2024-03-07 SURGICAL SUPPLY — 41 items
BAG BANDED W/RUBBER/TAPE 36X54 (MISCELLANEOUS) ×1 IMPLANT
BAG COUNTER SPONGE SURGICOUNT (BAG) ×1 IMPLANT
CANISTER SUCTION 3000ML PPV (SUCTIONS) ×1 IMPLANT
CATH BALLN ENROUTE 5X35 (CATHETERS) IMPLANT
CATH SUCT 10FR WHISTLE TIP (CATHETERS) ×1 IMPLANT
CLIP TI MEDIUM 6 (CLIP) ×1 IMPLANT
CLIP TI WIDE RED SMALL 6 (CLIP) ×1 IMPLANT
COVER DOME SNAP 22 D (MISCELLANEOUS) ×1 IMPLANT
COVER PROBE W GEL 5X96 (DRAPES) ×1 IMPLANT
DERMABOND ADVANCED .7 DNX12 (GAUZE/BANDAGES/DRESSINGS) ×1 IMPLANT
DERMABOND ADVANCED .7 DNX6 (GAUZE/BANDAGES/DRESSINGS) IMPLANT
DRAPE FEMORAL ANGIO 80X135IN (DRAPES) ×1 IMPLANT
ELECTRODE REM PT RTRN 9FT ADLT (ELECTROSURGICAL) ×1 IMPLANT
GLOVE SURG SS PI 7.5 STRL IVOR (GLOVE) ×3 IMPLANT
GOWN STRL REUS W/ TWL LRG LVL3 (GOWN DISPOSABLE) ×2 IMPLANT
GOWN STRL REUS W/ TWL XL LVL3 (GOWN DISPOSABLE) ×1 IMPLANT
GUIDEWIRE ENROUTE 0.014 (WIRE) ×1 IMPLANT
HEMOSTAT SNOW SURGICEL 2X4 (HEMOSTASIS) IMPLANT
KIT BASIN OR (CUSTOM PROCEDURE TRAY) ×1 IMPLANT
KIT ENCORE 26 ADVANTAGE (KITS) ×1 IMPLANT
KIT INTRODUCER GALT 7 (INTRODUCER) ×1 IMPLANT
KIT TURNOVER KIT B (KITS) ×1 IMPLANT
NDL HYPO 25GX1X1/2 BEV (NEEDLE) IMPLANT
NEEDLE HYPO 25GX1X1/2 BEV (NEEDLE) IMPLANT
PACK CAROTID (CUSTOM PROCEDURE TRAY) ×1 IMPLANT
POSITIONER HEAD DONUT 9IN (MISCELLANEOUS) ×1 IMPLANT
SET MICROPUNCTURE 5F STIFF (MISCELLANEOUS) ×1 IMPLANT
STENT TRANSCAROTID SYS 7X40 (Permanent Stent) IMPLANT
SURGIFLO W/THROMBIN 8M KIT (HEMOSTASIS) IMPLANT
SUT PROLENE 5 0 C 1 24 (SUTURE) ×2 IMPLANT
SUT SILK 2 0 PERMA HAND 18 BK (SUTURE) ×1 IMPLANT
SUT SILK 2 0 SH (SUTURE) ×1 IMPLANT
SUT VIC AB 3-0 SH 27X BRD (SUTURE) ×2 IMPLANT
SUT VIC AB 4-0 PS2 27 (SUTURE) ×1 IMPLANT
SYR 10ML LL (SYRINGE) ×3 IMPLANT
SYR 20ML LL LF (SYRINGE) ×1 IMPLANT
SYR CONTROL 10ML LL (SYRINGE) IMPLANT
SYSTEM TRANSCAROTID NEUROPRTCT (MISCELLANEOUS) ×1 IMPLANT
TOWEL GREEN STERILE (TOWEL DISPOSABLE) ×1 IMPLANT
WATER STERILE IRR 1000ML POUR (IV SOLUTION) ×1 IMPLANT
WIRE BENTSON .035X145CM (WIRE) ×1 IMPLANT

## 2024-03-07 NOTE — Progress Notes (Signed)
  Day of Surgery Note    Subjective:  pt resting comfortably in her room with family at bedside   Vitals:   03/07/24 1058 03/07/24 1100  BP: (!) 130/57   Pulse: 64 64  Resp: 18 18  Temp: 97.8 F (36.6 C)   SpO2: 98% 98%    Incisions:   left neck is clean and dry without hematoma neuro:  moving all extremities equally; tongue is midline Lungs:  non labored   Assessment/Plan:  This is a 83 y.o. female who is s/p  Left TCAR  -pt doing well in her room and following commands.  -incision looks good -continue asa/statin/plavix    Maryanna Smart, PA-C 03/07/2024 11:45 AM [phone]

## 2024-03-07 NOTE — Anesthesia Procedure Notes (Signed)
 Procedure Name: Intubation Date/Time: 03/07/2024 7:58 AM  Performed by: Dyanna Glasgow, CRNAPre-anesthesia Checklist: Patient identified, Emergency Drugs available, Suction available, Patient being monitored and Timeout performed Patient Re-evaluated:Patient Re-evaluated prior to induction Oxygen Delivery Method: Circle system utilized Preoxygenation: Pre-oxygenation with 100% oxygen Induction Type: IV induction Ventilation: Mask ventilation without difficulty Laryngoscope Size: Mac and 3 Grade View: Grade I Tube type: Oral Tube size: 7.0 mm Number of attempts: 1 Airway Equipment and Method: Stylet Placement Confirmation: ETT inserted through vocal cords under direct vision, positive ETCO2 and breath sounds checked- equal and bilateral Secured at: 21 cm Tube secured with: Tape Dental Injury: Teeth and Oropharynx as per pre-operative assessment

## 2024-03-07 NOTE — Transfer of Care (Signed)
 Immediate Anesthesia Transfer of Care Note  Patient: ZOI SALO  Procedure(s) Performed: Selmer Dally ARTERY REVASCULARIZATION (TCAR) (Left)  Patient Location: PACU  Anesthesia Type:General  Level of Consciousness: awake  Airway & Oxygen Therapy: Patient Spontanous Breathing and Patient connected to nasal cannula oxygen  Post-op Assessment: Report given to RN and Post -op Vital signs reviewed and stable  Post vital signs: Reviewed and stable  Last Vitals:  Vitals Value Taken Time  BP 115/55 03/07/24 0937  Temp    Pulse 65 03/07/24 0942  Resp 22 03/07/24 0942  SpO2 99 % 03/07/24 0942  Vitals shown include unfiled device data.  Last Pain:  Vitals:   03/07/24 0450  TempSrc: Oral  PainSc:          Complications: No notable events documented.

## 2024-03-07 NOTE — Op Note (Signed)
 Patient name: [name] MRN: [id number] DOB: 1941-05-20 Sex: female  03/07/2024 Pre-operative Diagnosis: Symptomatic left carotid stenosis Post-operative diagnosis:  Same Surgeon:  Gareld June Assistants:  Naida Austria, PA Procedure:   #1: Left carotid stent (TCAR)   #2: Retrograde flow reversal neuroprotection   #3: Ultrasound-guided access, right femoral vein Anesthesia:  General Blood Loss:  minimal Specimens:  none  Findings: Greater than 60% left carotid stenosis.  The external carotid was occluded  Stent:  ENROUTE 8x6x40 Predilation balloon:  5x30 Flow reversal time:  14 minutes Procedure time:  41 minutes Fluoroscopy time:  2.2 minutes    Indications: This is an 83 year old female who had an episode of dysphagia.  She was seen by the stroke service.  This was felt to be a TIA as her MRI was unremarkable.  She was found to have bilateral extracranial carotid stenosis.  The left side was felt to be the symptomatic side.  She had elevated troponins on arrival to the hospital.  She has been seen by cardiology.  Because of her comorbidities as well as the distal extent of the lesion which was at the top of C-2, I felt that stenting was the most appropriate course for treatment  Procedure:  The patient was identified in the holding area and taken to [facility] OR ROOM 16  The patient was then placed supine on the table. general anesthesia was administered.  The patient was prepped and draped in the usual sterile fashion.  A time out was called and antibiotics were administered.  A PA was necessary to expedite the procedure and assist with technical details.  He helped with vein cannulation, exposure by providing suction retraction, wire exchanges, and sheath removal  Ultrasound was used to evaluate the right common femoral vein which was widely patent and easily compressible.  It was cannulated under ultrasound guidance with a micropuncture needle.  A 018 wire was inserted followed by  placement of micropuncture sheath.  Next over a Bentson wire the TCAR sheath was placed.  The patient was fully heparinized.  A transverse incision was made just above the clavicle on the left.  Cautery was used to divide subcutaneous tissue and platysma muscle.  I developed a avascular plane between the 2 heads of the sternocleidomastoid.  The vagus nerve was visualized and protected.  The common carotid artery was circumferentially dissected free.  It had minimal disease.  A 5-0 Prolene U-stitch was placed in the adventitia and the left common carotid artery was cannulated with a micropuncture needle.  A 018 wire was inserted up to the line on the wire followed by placement of a micropuncture sheath.  A contrast injection was performed locating the proximal extent of the disease which was marked on the screen.  Next a carotid Amplatz wire was inserted up the line and the TCAR sheath was placed.  Flow reversal tubing was then connected and passive flow reversal was initiated and confirmed with a saline flush.  A TCAR timeout was then performed.  The common carotid artery was occluded with a Silastic loop.  Active flow reversal was confirmed with a saline flush.  A contrast injection was then performed locating the extent of the disease.  The external carotid was occluded.  The TCAR wire was then advanced through the lesion into the distal internal carotid artery.  The lesion was predilated with a 5 x 30 balloon.  We did not get a significant hemodynamic response.  I then selected an 8  x 6 x 40 stent which was deployed across the lesion.  We waited 2 minutes and then took completion imaging in multiple views which showed a widely patent carotid artery.  Wire was then removed.  Flow reversal was discontinued and blood was returned to the femoral sheath.  The sheath in the carotid was removed and the arteriotomy site closed by securing the previously placed Prolene suture hand-held Doppler was used to confirm an  excellent signal in the carotid artery.  50 mg of protamine was administered.  Hemostasis was achieved.  The sheath in the groin was removed and manual pressure was held for hemostasis.  Surgiflo was placed over top of the carotid artery.  The platysma muscle was reapproximated 3-0 Vicryl and the skin was closed with 4-0 Vicryl.  Dermabond was applied to the skin.  The patient was successfully extubated and found to be neurologically intact.  She was taken the recovery in stable condition.   Disposition: To PACU stable.   Reinaldo Caras, M.D., [facility] [place] Office: [phone] Pager:  [phone]

## 2024-03-07 NOTE — Progress Notes (Signed)
 PROGRESS NOTE    [name]  VWU:[id number] DOB: September 04, 1941 DOA: 03/03/2024 PCP: [name], FNP    Chief Complaint  Patient presents with   Dysuria    Brief Narrative:  Patient is a 83 year old female with CKD stage IIIa, N syndrome, diabetes mellitus type 2, diverticulosis, frequent UTIs, GERD, hiatal hernia, H. pylori infection, history of palpitations, herpes zoster, HTN, HLP history of CVA vitamin D  deficiency, recently treated for UTI and was prescribed Macrobid .  Presented to ED with complaints of dysuria and frequency, no flank pain or hematuria.   She was found to be febrile in the ED temp 100.2 F.  CBC showed WBCs 12.1 Flu, COVID, RSV PCR negative. Patient was admitted for further workup.  Chest x-ray showed no active disease + Elevated troponins, cardiology consulted   On 5/6, patient had a code stroke for acute aphasia, neurology was consulted and underwent full stroke workup Workup concerning for TIA and symptomatic left ICA stenosis patient transferred to [facility] for evaluation by vascular surgery for TCAR.   Assessment & Plan:   Principal Problem:   Acute cystitis Active Problems:   Symptomatic stenosis of left carotid artery   Essential hypertension   Hyperlipidemia associated with type 2 diabetes mellitus (HCC)   Gastroesophageal reflux disease without esophagitis   Hypertensive nephropathy   Aortic atherosclerosis (HCC)   Chronic kidney disease, stage 3a (HCC)   OSA on CPAP   Elevated troponin   Type 2 diabetes mellitus with hyperglycemia (HCC)   Mild protein malnutrition (HCC)   Transaminitis   Pseudohyponatremia   Hyperbilirubinemia   Class 2 obesity   PVC's (premature ventricular contractions)   TIA (transient ischemic attack)  #1 TIA/symptomatic L ICA stenosis -Patient noted during the hospitalization to have a code stroke which was activated due to aphasia. - CT head done with no acute abnormality, small vessel disease. - CT  angiogram head and neck done with atherosclerosis disease at both carotid bifurcations with 70% stenosis of both proximal ICA.  Atherosclerotic disease in the carotid siphon regions with stenosis estimated at 30 to 50% on both sides.  Atherosclerotic disease in the right V4 segment with stenosis estimated at 50%. - MRI negative for any acute abnormalities. - 2D echo done with a EF of 50 to 55%, calcified aortic valve, mild mitral stenosis, moderate aortic valve stenosis.  Atrial septum grossly normal. - LDL noted at 90 .-A1c 7.7. - Patient noted to be on aspirin  81 mg daily prior to admission currently on aspirin  81 mg daily and Plavix  75 mg daily. - Neurology recommending dual antiplatelet therapy with aspirin  and Plavix  following carotid stent for 6 months and then Plavix  alone with risk factor modification. - PT OT/SLP. -Patient transferred to [facility] for evaluation by vascular surgery as aphasia was felt to be secondary to symptomatic left ICA stenosis needing revascularization. -Patient seen by vascular surgery and patient underwent TCAR this morning, 03/07/2024.  - Neurology following and appreciate input and recommendations. - Vascular surgery following and appreciate their input and recommendations.  2.  Acute cystitis -Patient noted to have been placed on Macrobid  in the outpatient setting for UTI, no cultures seen from PCPs office. - Repeat urinalysis done during this hospitalization negative. - Continue empiric IV Rocephin  and treat empirically for course of 3 to 5 days.  3.  Elevated troponin/non-STEMI -Felt likely secondary to demand ischemia in the setting of acute infection. - 2D echo done with a EF of 50 to 55%, calcified  aortic valve, mild mitral stenosis, moderate aortic valve stenosis.  Atrial septum grossly normal. - Patient seen by cardiology initially recommended outpatient coronary CTA. - Patient underwent Lexiscan  Myoview  stress on 03/06/2024.  - Continue aspirin ,  Plavix , statin. - Per cardiology.  4.  Hypertension -Patient noted to have bouts of soft blood pressure/hypotension with systolic in the 80s with complaints of dizziness or lightheadedness on 03/05/2024 and as such antihypertensive medications held. -BP improving. -Continue to hold antihypertensive medications. -Follow.  5.  GERD -Stable. - Continue PPI.  6.  Aortic atherosclerosis/hyperlipidemia - Statin.  7.  CKD stage IIIa -Stable.  8.  OSA -CPAP nightly.  9.  Diabetes mellitus type 2 -Hemoglobin A1c 7.7. - Patient states he was taken off diabetic medications and was being managed at home with diet. - CBG 144 this morning. - SSI.    DVT prophylaxis: SCDs Code Status: Full Family Communication: Updated patient, husband, son and daughter-in-law at bedside.  Disposition: TBD  Status is: Inpatient Remains inpatient appropriate because: Severity of illness   Consultants:  Cardiology: Dr. Avanell Bob 03/03/2024 Neurology: Dr. Doretta Gant 03/04/2024 Vascular surgery: [name] 03/06/2024  Procedures:  Lexiscan  Myoview  stress test 03/06/2024 CT head 03/04/2024 CT angiogram head and neck 03/04/2024 2D echo 03/03/2024 MRI brain 03/04/2024 Chest x-ray 03/03/2024 TCAR/left carotid stent placement per vascular surgery: [name] 03/07/2024  Antimicrobials:  Anti-infectives (From admission, onward)    Start     Dose/Rate Route Frequency Ordered Stop   03/04/24 1500  azithromycin  (ZITHROMAX ) tablet 500 mg  Status:  Discontinued        500 mg Oral Daily 03/04/24 1413 03/04/24 1429   03/04/24 1400  azithromycin  (ZITHROMAX ) 500 mg in sodium chloride  0.9 % 250 mL IVPB  Status:  Discontinued        500 mg 250 mL/hr over 60 Minutes Intravenous Every 24 hours 03/04/24 1301 03/04/24 1413   03/04/24 0700  cefTRIAXone  (ROCEPHIN ) 2 g in sodium chloride  0.9 % 100 mL IVPB        2 g 200 mL/hr over 30 Minutes Intravenous Every 24 hours 03/03/24 0829     03/04/24 0200  cefTRIAXone  (ROCEPHIN ) 1 g in sodium  chloride 0.9 % 100 mL IVPB  Status:  Discontinued        1 g 200 mL/hr over 30 Minutes Intravenous Every 24 hours 03/03/24 0517 03/03/24 0829   03/03/24 0830  cefTRIAXone  (ROCEPHIN ) 1 g in sodium chloride  0.9 % 100 mL IVPB       Note to Pharmacy: Greater than 100 kg weight.   1 g 200 mL/hr over 30 Minutes Intravenous  Once 03/03/24 0829 03/03/24 0934   03/03/24 0445  cefTRIAXone  (ROCEPHIN ) 1 g in sodium chloride  0.9 % 100 mL IVPB        1 g 200 mL/hr over 30 Minutes Intravenous  Once 03/03/24 0443 03/03/24 0627         Subjective: Patient drowsy but arousable.  Just returned from the OR.  Likely still sedated.  Patient denies any shortness of breath or chest pain when awoken.  Husband, son and daughter-in-law at bedside.   Objective: Vitals:   03/07/24 1030 03/07/24 1045 03/07/24 1058 03/07/24 1100  BP:   (!) 130/57   Pulse: 65 65 64 64  Resp: 18 17 18 18   Temp:   97.8 F (36.6 C)   TempSrc:      SpO2: 97% 98% 98% 98%  Weight:      Height:  Intake/Output Summary (Last 24 hours) at 03/07/2024 1150 Last data filed at 03/06/2024 1826 Gross per 24 hour  Intake 220 ml  Output --  Net 220 ml   Filed Weights   03/04/24 0438 03/05/24 0459 03/07/24 0700  Weight: 105.9 kg 106.1 kg 108.1 kg    Examination:  General exam: NAD Respiratory system: CTAB anterior lung fields.  No wheezes, no crackles, no rhonchi.   Cardiovascular system: RRR no murmurs rubs or gallops.  No JVD.  No lower extremity edema. Gastrointestinal system: Abdomen is soft, nontender, nondistended, positive bowel sounds.  No rebound.  No guarding.  Central nervous system: Alert and oriented.  Moving extremities spontaneously.  Sensation intact.  Gait not tested secondary to safety.  No focal neurological deficits. Extremities: Symmetric 5 x 5 power. Skin: No rashes, lesions or ulcers Psychiatry: Judgement and insight appear fair. Mood & affect appropriate.     Data Reviewed: I have personally reviewed  following labs and imaging studies  CBC: Recent Labs  Lab 03/03/24 0345 03/04/24 1805 03/05/24 0516 03/06/24 1411 03/07/24 0344  WBC 12.1* 7.7 7.6 9.2 8.6  NEUTROABS 10.4*  --   --   --   --   HGB 13.2 14.1 14.6 14.8 12.6  HCT 39.9 43.2 45.3 43.9 38.1  MCV 88.9 89.1 90.2 86.6 86.8  PLT 181 192 222 213 175    Basic Metabolic Panel: Recent Labs  Lab 03/03/24 0345 03/03/24 0600 03/04/24 1805 03/05/24 0516 03/06/24 1411 03/07/24 0344  NA 134*  --  137 132* 139 139  K 4.1  --  3.8 3.5 4.8 3.9  CL 102  --  101 97* 103 107  CO2 22  --  26 22 23  21*  GLUCOSE 198*  --  124* 160* 118* 134*  BUN 30*  --  29* 26* 21 18  CREATININE 1.09*  --  1.17* 1.33* 1.05* 1.04*  CALCIUM  8.3*  --  8.9 8.8* 9.2 8.7*  MG  --  1.7  --   --   --  1.8  PHOS  --   --   --   --  3.5 3.4    GFR: Estimated Creatinine Clearance: 52.8 mL/min (A) (by C-G formula based on SCr of 1.04 mg/dL (H)).  Liver Function Tests: Recent Labs  Lab 03/03/24 0345 03/05/24 0516 03/06/24 1411 03/07/24 0344  AST 54* 37  --   --   ALT 52* 44  --   --   ALKPHOS 41 46  --   --   BILITOT 1.6* 1.3*  --   --   PROT 6.5 7.1  --   --   ALBUMIN 3.1* 3.4* 3.3* 2.8*    CBG: Recent Labs  Lab 03/06/24 1418 03/06/24 1611 03/06/24 2105 03/07/24 0645 03/07/24 0943  GLUCAP 115* 139* 166* 175* 144*     Recent Results (from the past 240 hours)  Resp panel by RT-PCR (RSV, Flu A&B, Covid) Anterior Nasal Swab     Status: None   Collection Time: 03/03/24  3:40 AM   Specimen: Anterior Nasal Swab  Result Value Ref Range Status   SARS Coronavirus 2 by RT PCR NEGATIVE NEGATIVE Final    Comment: (NOTE) SARS-CoV-2 target nucleic acids are NOT DETECTED.  The SARS-CoV-2 RNA is generally detectable in upper respiratory specimens during the acute phase of infection. The lowest concentration of SARS-CoV-2 viral copies this assay can detect is 138 copies/mL. A negative result does not preclude SARS-Cov-2 infection and should  not  be used as the sole basis for treatment or other patient management decisions. A negative result may occur with  improper specimen collection/handling, submission of specimen other than nasopharyngeal swab, presence of viral mutation(s) within the areas targeted by this assay, and inadequate number of viral copies(<138 copies/mL). A negative result must be combined with clinical observations, patient history, and epidemiological information. The expected result is Negative.  Fact Sheet for Patients:  BloggerCourse.com  Fact Sheet for Healthcare Providers:  SeriousBroker.it  This test is no t yet approved or cleared by the [place]  FDA and  has been authorized for detection and/or diagnosis of SARS-CoV-2 by FDA under an Emergency Use Authorization (EUA). This EUA will remain  in effect (meaning this test can be used) for the duration of the COVID-19 declaration under Section 564(b)(1) of the Act, 21 U.S.C.section 360bbb-3(b)(1), unless the authorization is terminated  or revoked sooner.       Influenza A by PCR NEGATIVE NEGATIVE Final   Influenza B by PCR NEGATIVE NEGATIVE Final    Comment: (NOTE) The Xpert Xpress SARS-CoV-2/FLU/RSV plus assay is intended as an aid in the diagnosis of influenza from Nasopharyngeal swab specimens and should not be used as a sole basis for treatment. Nasal washings and aspirates are unacceptable for Xpert Xpress SARS-CoV-2/FLU/RSV testing.  Fact Sheet for Patients: BloggerCourse.com  Fact Sheet for Healthcare Providers: SeriousBroker.it  This test is not yet approved or cleared by the [place]  FDA and has been authorized for detection and/or diagnosis of SARS-CoV-2 by FDA under an Emergency Use Authorization (EUA). This EUA will remain in effect (meaning this test can be used) for the duration of the COVID-19 declaration under  Section 564(b)(1) of the Act, 21 U.S.C. section 360bbb-3(b)(1), unless the authorization is terminated or revoked.     Resp Syncytial Virus by PCR NEGATIVE NEGATIVE Final    Comment: (NOTE) Fact Sheet for Patients: BloggerCourse.com  Fact Sheet for Healthcare Providers: SeriousBroker.it  This test is not yet approved or cleared by the [place]  FDA and has been authorized for detection and/or diagnosis of SARS-CoV-2 by FDA under an Emergency Use Authorization (EUA). This EUA will remain in effect (meaning this test can be used) for the duration of the COVID-19 declaration under Section 564(b)(1) of the Act, 21 U.S.C. section 360bbb-3(b)(1), unless the authorization is terminated or revoked.  Performed at [facility], [address], [place], [place] [address]   Blood culture (routine x 2)     Status: None (Preliminary result)   Collection Time: 03/03/24  3:45 AM   Specimen: BLOOD  Result Value Ref Range Status   Specimen Description   Final    BLOOD SITE NOT SPECIFIED Performed at [facility], [address], [place], [place] [address]    Special Requests   Final    BOTTLES DRAWN AEROBIC AND ANAEROBIC Blood Culture results may not be optimal due to an inadequate volume of blood received in culture bottles Performed at [facility], [address], [place], [place] [address]    Culture   Final    NO GROWTH 4 DAYS Performed at [facility], [address] [place], [place] [address]    Report Status PENDING  Incomplete  Blood culture (routine x 2)     Status: None (Preliminary result)   Collection Time: 03/03/24  4:30 AM   Specimen: BLOOD  Result Value Ref Range Status   Specimen Description   Final  BLOOD LEFT ANTECUBITAL Performed at [facility], [address], [place], [place] [address]    Special Requests   Final    BOTTLES  DRAWN AEROBIC AND ANAEROBIC Blood Culture results may not be optimal due to an inadequate volume of blood received in culture bottles Performed at [facility], [address] [place], [place] [address]    Culture   Final    NO GROWTH 4 DAYS Performed at [facility], [address], [place], [place] [address]    Report Status PENDING  Incomplete  Respiratory (~20 pathogens) panel by PCR     Status: None   Collection Time: 03/04/24  1:03 PM   Specimen: Nasopharyngeal Swab; Respiratory  Result Value Ref Range Status   Adenovirus NOT DETECTED NOT DETECTED Final   Coronavirus 229E NOT DETECTED NOT DETECTED Final    Comment: (NOTE) The Coronavirus on the Respiratory Panel, DOES NOT test for the novel  Coronavirus (2019 nCoV)    Coronavirus HKU1 NOT DETECTED NOT DETECTED Final   Coronavirus NL63 NOT DETECTED NOT DETECTED Final   Coronavirus OC43 NOT DETECTED NOT DETECTED Final   Metapneumovirus NOT DETECTED NOT DETECTED Final   Rhinovirus / Enterovirus NOT DETECTED NOT DETECTED Final   Influenza A NOT DETECTED NOT DETECTED Final   Influenza B NOT DETECTED NOT DETECTED Final   Parainfluenza Virus 1 NOT DETECTED NOT DETECTED Final   Parainfluenza Virus 2 NOT DETECTED NOT DETECTED Final   Parainfluenza Virus 3 NOT DETECTED NOT DETECTED Final   Parainfluenza Virus 4 NOT DETECTED NOT DETECTED Final   Respiratory Syncytial Virus NOT DETECTED NOT DETECTED Final   Bordetella pertussis NOT DETECTED NOT DETECTED Final   Bordetella Parapertussis NOT DETECTED NOT DETECTED Final   Chlamydophila pneumoniae NOT DETECTED NOT DETECTED Final   Mycoplasma pneumoniae NOT DETECTED NOT DETECTED Final    Comment: Performed at [facility], [address] [place], [place] [address]  Surgical pcr screen     Status: None   Collection Time: 03/06/24  4:54 PM   Specimen: Nasal Mucosa; Nasal Swab  Result Value Ref Range Status   MRSA, PCR NEGATIVE NEGATIVE Final    Staphylococcus aureus NEGATIVE NEGATIVE Final    Comment: (NOTE) The Xpert SA Assay (FDA approved for NASAL specimens in patients 5 years of age and older), is one component of a comprehensive surveillance program. It is not intended to diagnose infection nor to guide or monitor treatment. Performed at [facility], [address] [place], [place] [address]          Radiology Studies: HYBRID OR IMAGING [facility] ONLY) Result Date: 03/07/2024 There is no interpretation for this exam.  This order is for images obtained during a surgical procedure.  Please See "Surgeries" Tab for more information regarding the procedure.   NM Myocar Multi W/Spect W/Wall Motion / EF Result Date: 03/06/2024 CLINICAL DATA:  83 year old female with chest pain. Abnormal EKG. Intermediate risk for coronary disease. EXAM: MYOCARDIAL IMAGING WITH SPECT (REST AND PHARMACOLOGIC-STRESS) GATED LEFT VENTRICULAR WALL MOTION STUDY LEFT VENTRICULAR EJECTION FRACTION TECHNIQUE: Standard myocardial SPECT imaging was performed after resting intravenous injection of 10 mCi Tc-73m tetrofosmin . Subsequently, intravenous infusion of Lexiscan  was performed under the supervision of the Cardiology staff. At peak effect of the drug, 30 mCi Tc-22m tetrofosmin  was injected intravenously and standard myocardial SPECT imaging was performed. Quantitative gated imaging was also performed to evaluate left ventricular wall motion, and estimate left  ventricular ejection fraction. COMPARISON:  None Available. FINDINGS: Perfusion: Medium size region moderate decreased counts in the apical and mid segment of the anterior septal wall which are fixed on rest and stress. No evidence reversible ischemia. No ventricular dilatation. Wall Motion: Normal left ventricular wall motion. No left ventricular dilation. Left Ventricular Ejection Fraction: 38 % End diastolic volume 76 ml End systolic volume 46 ml IMPRESSION: 1. Fixed defect in the anterior  septal wall suggest prior infarction. No evidence reversible ischemia. 2. Normal left ventricular wall motion. 3. Left ventricular ejection fraction 38% 4. Non invasive risk stratification*: High ( primarily based on low ejection fraction). *2012 Appropriate Use Criteria for Coronary Revascularization Focused Update: J Am Coll Cardiol. 2012;59(9):857-881. http://content.dementiazones.com.aspx?articleid=[email or web address] Electronically Signed   By: Deboraha Fallow M.D.   On: 03/06/2024 16:04        Scheduled Meds:  aspirin   81 mg Oral Daily   Or   aspirin   300 mg Rectal Daily   clopidogrel   75 mg Oral Daily   insulin  aspart  0-15 Units Subcutaneous TID WC   pantoprazole   40 mg Oral Daily   rosuvastatin   40 mg Oral Daily   Continuous Infusions:  sodium chloride      sodium chloride  75 mL/hr at 03/07/24 1132   cefTRIAXone  (ROCEPHIN )  IV 0 g (03/06/24 0813)   lactated ringers  75 mL/hr at 03/07/24 1038     LOS: 1 day    Time spent: 35 minutes    Hilda Lovings, MD [facility]   To contact the attending provider between 7A-7P or the covering provider during after hours 7P-7A, please log into the web site [email or web address] and access using universal [place] password for that web site. If you do not have the password, please call the hospital operator.  03/07/2024, 11:50 AM

## 2024-03-07 NOTE — Anesthesia Postprocedure Evaluation (Signed)
 Anesthesia Post Note  Patient: BANI UM  Procedure(s) Performed: [facility] ARTERY REVASCULARIZATION (TCAR) (Left)     Patient location during evaluation: PACU Anesthesia Type: General Level of consciousness: awake and alert Pain management: pain level controlled Vital Signs Assessment: post-procedure vital signs reviewed and stable Respiratory status: spontaneous breathing, nonlabored ventilation, respiratory function stable and patient connected to nasal cannula oxygen Cardiovascular status: blood pressure returned to baseline and stable Postop Assessment: no apparent nausea or vomiting Anesthetic complications: no   No notable events documented.  Last Vitals:  Vitals:   03/07/24 1000 03/07/24 1007  BP: (!) 126/57 (!) 130/57  Pulse: 62 64  Resp: 19 17  Temp:  36.4 C  SpO2: 100% 99%    Last Pain:  Vitals:   03/07/24 1000  TempSrc:   PainSc: 0-No pain                 Meosha Castanon S

## 2024-03-07 NOTE — Progress Notes (Signed)
 A-line removed after it stopped working.

## 2024-03-07 NOTE — Progress Notes (Signed)
 STROKE TEAM PROGRESS NOTE   INTERIM HISTORY/SUBJECTIVE Patient had TCAR procedure earlier today by Dr.Brabham .  She is lying in bed comfortably. Neurological exam is unchanged.    OBJECTIVE  CBC    Component Value Date/Time   WBC 8.6 03/07/2024 0344   RBC 4.39 03/07/2024 0344   HGB 12.6 03/07/2024 0344   HGB CANCELED 07/12/2021 1045   HCT 38.1 03/07/2024 0344   HCT CANCELED 07/12/2021 1045   PLT 175 03/07/2024 0344   PLT CANCELED 07/12/2021 1045   MCV 86.8 03/07/2024 0344   MCV CANCELED 07/12/2021 1045   MCH 28.7 03/07/2024 0344   MCHC 33.1 03/07/2024 0344   RDW 13.2 03/07/2024 0344   RDW CANCELED 07/12/2021 1045   LYMPHSABS 0.6 (L) 03/03/2024 0345   LYMPHSABS 2.8 12/30/2020 1300   MONOABS 0.7 03/03/2024 0345   EOSABS 0.4 03/03/2024 0345   EOSABS 0.4 12/30/2020 1300   BASOSABS 0.0 03/03/2024 0345   BASOSABS 0.1 12/30/2020 1300    BMET    Component Value Date/Time   NA 139 03/07/2024 0344   NA 141 06/08/2023 1522   K 3.9 03/07/2024 0344   CL 107 03/07/2024 0344   CO2 21 (L) 03/07/2024 0344   GLUCOSE 134 (H) 03/07/2024 0344   BUN 18 03/07/2024 0344   BUN 21 06/08/2023 1522   CREATININE 1.04 (H) 03/07/2024 0344   CALCIUM  8.7 (L) 03/07/2024 0344   EGFR 42 (L) 06/08/2023 1522   GFRNONAA 54 (L) 03/07/2024 0344    IMAGING past 24 hours HYBRID OR IMAGING ([facility] ONLY) Result Date: 03/07/2024 There is no interpretation for this exam.  This order is for images obtained during a surgical procedure.  Please See "Surgeries" Tab for more information regarding the procedure.    Vitals:   03/07/24 1045 03/07/24 1058 03/07/24 1100 03/07/24 1202  BP:  (!) 130/57    Pulse: 65 64 64   Resp: 17 18 18    Temp:  97.8 F (36.6 C)    TempSrc:    (P) Oral  SpO2: 98% 98% 98%   Weight:      Height:         PHYSICAL EXAM General:  Alert, well-nourished, well-developed patient in no acute distress Psych:  Mood and affect appropriate for situation CV: Regular rate and rhythm on  monitor Respiratory:  Regular, unlabored respirations on room air GI: Abdomen soft and nontender   NEURO:  Mental Status: AA&Ox3, patient is able to give clear and coherent history Speech/Language: speech is without dysarthria or aphasia.  Naming, repetition, fluency, and comprehension intact.  Cranial Nerves:  II: PERRL. Visual fields full.  III, IV, VI: EOMI. Eyelids elevate symmetrically.  V: Sensation is intact to light touch and symmetrical to face.  VII: Face is symmetrical resting and smiling VIII: hearing intact to voice. IX, X: Palate elevates symmetrically. Phonation is normal.  ZO:XWRUEAVW shrug 5/5. XII: tongue is midline without fasciculations. Motor: 5/5 strength to all muscle groups tested.  Tone: is normal and bulk is normal Sensation- Intact to light touch bilaterally. Extinction absent to light touch to DSS.   Coordination: FTN intact bilaterally, HKS: no ataxia in BLE.No drift.  Gait- deferred  Most Recent NIH 0     ASSESSMENT/PLAN  [name] is a 83 y.o. female with history of frequent UTIs, GERD, hyperlipidemia, hypertension, obesity, stroke, ICH who initially went to the ED for complaints of dysuria.  She was then found to have an elevated troponin and cardiology was consulted.  During her hospitalization a code stroke was activated due to aphasia.  MRI was negative for any acute intracranial abnormality, however she was found to have left ICA stenosis.  She was transferred [facility] for further stroke workup and vascular surgery is planning on doing a TCAR on Friday.  TIA:   Etiology:  symptomatic L ICA stenosis  s/p TCAR today Code Stroke CT head No acute abnormality. Small vessel disease. ASPECTS 10.    CTA head & neck Atherosclerotic disease at both carotid bifurcations with 70% stenosis of both proximal internal carotid arteries. Atherosclerotic disease in the carotid siphon regions with stenosis estimated  at 30-50% on both sides. Atherosclerotic disease in the right V4 segment with stenosis estimated at 50%. MRI  No acute intracranial abnormality 2D Echo EF 50-55%, calcified aortic valve LDL 90 HgbA1c 7.7 VTE prophylaxis - SCDs aspirin  81 mg daily prior to admission, now on aspirin  81 mg daily and clopidogrel  75 mg daily; duration to be determined by vascular surgery team Therapy recommendations:  Pending Disposition:  pending  Hx of Stroke/TIA 03/2022 admitted for episode of expressive aphasia.  MRI negative for stroke.  CTA head and neck unremarkable.  LDL 98, A1c 6.4.  EF 50 to 55%.  Discharged on DAPT 09/2022 admitted for right occipital ICH.  CT head and neck no LVO, CTV no sinus thrombosis.  MRI stable hematoma with microhemorrhages.  12/04/2022 follow-up with Dr. Dee [name] he had LBN, restart aspirin  81  Hypertension Home meds: Hydralazine , losartan/amlodipine /hydrochlorothiazide  Stable Blood Pressure Goal: BP less than 220/110   Hyperlipidemia Home meds: Crestor  40 mg, resumed in hospital LDL 90, goal < 70 Continue statin at discharge  Diabetes type II Uncontrolled Home meds: Farxiga  HgbA1c 7.7, goal < 7.0 CBGs SSI Recommend close follow-up with PCP for better DM control  Dysphagia Patient has post-stroke dysphagia, SLP consulted    Diet   Diet clear liquid Room service appropriate? Yes; Fluid consistency: Thin   Advance diet as tolerated  Other Stroke Risk Factors Obesity, Body mass index is 38.47 kg/m., BMI >/= 30 associated with increased stroke risk, recommend weight loss, diet and exercise as appropriate  Coronary artery disease Elevated troponin on arrival Cardiology following Lexiscan  done 5/8   Hospital day # 1   Patient presented with transient episode of expressive aphasia and CT angiogram shows moderate calcific left carotid stenosis.  Patient underwent today elective left carotid revascularization using TCAR procedure by vascular surgery and is doing  well..  Continue aspirin  and Plavix  following carotid stent for 6 months and then Plavix  alone.  Aggressive risk factor modification.  Long discussion with patient and her daughter at the bedside and answered questions.  Follow-up as an outpatient stroke clinic in 2 months.  Stroke team will sign off.  Greater than 50% time during this 35-minute visit was spent in counseling and coordination of care discussion with patient and care team and answering questions. Ardella Beaver, MD Medical Director [facility] Pager: [phone] 03/07/2024 3:05 PM   To contact Stroke Continuity provider, please refer to [email or web address]. After hours, contact General Neurology

## 2024-03-07 NOTE — Plan of Care (Signed)
 Problem: Education: Goal: Ability to describe self-care measures that may prevent or decrease complications (Diabetes Survival Skills Education) will improve 03/07/2024 2050 by Channie Comings, RN Outcome: Progressing 03/07/2024 2049 by Channie Comings, RN Outcome: Progressing Goal: Individualized Educational Video(s) 03/07/2024 2050 by Channie Comings, RN Outcome: Progressing 03/07/2024 2049 by Channie Comings, RN Outcome: Progressing   Problem: Coping: Goal: Ability to adjust to condition or change in health will improve 03/07/2024 2050 by Channie Comings, RN Outcome: Progressing 03/07/2024 2049 by Channie Comings, RN Outcome: Progressing   Problem: Fluid Volume: Goal: Ability to maintain a balanced intake and output will improve 03/07/2024 2050 by Channie Comings, RN Outcome: Progressing 03/07/2024 2049 by Channie Comings, RN Outcome: Progressing   Problem: Health Behavior/Discharge Planning: Goal: Ability to identify and utilize available resources and services will improve 03/07/2024 2050 by Channie Comings, RN Outcome: Progressing 03/07/2024 2049 by Channie Comings, RN Outcome: Progressing Goal: Ability to manage health-related needs will improve 03/07/2024 2050 by Channie Comings, RN Outcome: Progressing 03/07/2024 2049 by Channie Comings, RN Outcome: Progressing   Problem: Metabolic: Goal: Ability to maintain appropriate glucose levels will improve 03/07/2024 2050 by Channie Comings, RN Outcome: Progressing 03/07/2024 2049 by Channie Comings, RN Outcome: Progressing   Problem: Nutritional: Goal: Maintenance of adequate nutrition will improve 03/07/2024 2050 by Channie Comings, RN Outcome: Progressing 03/07/2024 2049 by Channie Comings, RN Outcome: Progressing Goal: Progress toward achieving an optimal weight will improve 03/07/2024 2050 by Channie Comings, RN Outcome: Progressing 03/07/2024 2049 by Channie Comings, RN Outcome:  Progressing   Problem: Skin Integrity: Goal: Risk for impaired skin integrity will decrease 03/07/2024 2050 by Channie Comings, RN Outcome: Progressing 03/07/2024 2049 by Channie Comings, RN Outcome: Progressing   Problem: Tissue Perfusion: Goal: Adequacy of tissue perfusion will improve 03/07/2024 2050 by Channie Comings, RN Outcome: Progressing 03/07/2024 2049 by Channie Comings, RN Outcome: Progressing   Problem: Education: Goal: Knowledge of General Education information will improve Description: Including pain rating scale, medication(s)/side effects and non-pharmacologic comfort measures 03/07/2024 2050 by Channie Comings, RN Outcome: Progressing 03/07/2024 2049 by Channie Comings, RN Outcome: Progressing   Problem: Health Behavior/Discharge Planning: Goal: Ability to manage health-related needs will improve 03/07/2024 2050 by Channie Comings, RN Outcome: Progressing 03/07/2024 2049 by Channie Comings, RN Outcome: Progressing   Problem: Clinical Measurements: Goal: Ability to maintain clinical measurements within normal limits will improve 03/07/2024 2050 by Channie Comings, RN Outcome: Progressing 03/07/2024 2049 by Channie Comings, RN Outcome: Progressing Goal: Will remain free from infection 03/07/2024 2050 by Channie Comings, RN Outcome: Progressing 03/07/2024 2049 by Channie Comings, RN Outcome: Progressing Goal: Diagnostic test results will improve 03/07/2024 2050 by Channie Comings, RN Outcome: Progressing 03/07/2024 2049 by Channie Comings, RN Outcome: Progressing Goal: Respiratory complications will improve 03/07/2024 2050 by Channie Comings, RN Outcome: Progressing 03/07/2024 2049 by Channie Comings, RN Outcome: Progressing Goal: Cardiovascular complication will be avoided 03/07/2024 2050 by Channie Comings, RN Outcome: Progressing 03/07/2024 2049 by Channie Comings, RN Outcome: Progressing   Problem: Activity: Goal: Risk  for activity intolerance will decrease 03/07/2024 2050 by Channie Comings, RN Outcome: Progressing 03/07/2024 2049 by Channie Comings, RN Outcome: Progressing   Problem: Nutrition: Goal: Adequate nutrition will be maintained 03/07/2024 2050 by Channie Comings, RN Outcome: Progressing 03/07/2024 2049 by Channie Comings, RN Outcome: Progressing   Problem: Coping: Goal: Level of anxiety will decrease 03/07/2024 2050 by Channie Comings, RN Outcome: Progressing 03/07/2024 2049 by Channie Comings, RN Outcome: Progressing   Problem: Elimination: Goal: Will not experience complications related to bowel motility 03/07/2024 2050  by Channie Comings, RN Outcome: Progressing 03/07/2024 2049 by Channie Comings, RN Outcome: Progressing Goal: Will not experience complications related to urinary retention 03/07/2024 2050 by Channie Comings, RN Outcome: Progressing 03/07/2024 2049 by Channie Comings, RN Outcome: Progressing   Problem: Pain Managment: Goal: General experience of comfort will improve and/or be controlled 03/07/2024 2050 by Channie Comings, RN Outcome: Progressing 03/07/2024 2049 by Channie Comings, RN Outcome: Progressing   Problem: Safety: Goal: Ability to remain free from injury will improve 03/07/2024 2050 by Channie Comings, RN Outcome: Progressing 03/07/2024 2049 by Channie Comings, RN Outcome: Progressing   Problem: Skin Integrity: Goal: Risk for impaired skin integrity will decrease 03/07/2024 2050 by Channie Comings, RN Outcome: Progressing 03/07/2024 2049 by Channie Comings, RN Outcome: Progressing   Problem: Education: Goal: Knowledge of disease or condition will improve 03/07/2024 2050 by Channie Comings, RN Outcome: Progressing 03/07/2024 2049 by Channie Comings, RN Outcome: Progressing Goal: Knowledge of secondary prevention will improve (MUST DOCUMENT ALL) 03/07/2024 2050 by Channie Comings, RN Outcome: Progressing 03/07/2024  2049 by Channie Comings, RN Outcome: Progressing Goal: Knowledge of patient specific risk factors will improve (DELETE if not current risk factor) 03/07/2024 2050 by Channie Comings, RN Outcome: Progressing 03/07/2024 2049 by Channie Comings, RN Outcome: Progressing   Problem: Ischemic Stroke/TIA Tissue Perfusion: Goal: Complications of ischemic stroke/TIA will be minimized 03/07/2024 2050 by Channie Comings, RN Outcome: Progressing 03/07/2024 2049 by Channie Comings, RN Outcome: Progressing   Problem: Coping: Goal: Will verbalize positive feelings about self 03/07/2024 2050 by Channie Comings, RN Outcome: Progressing 03/07/2024 2049 by Channie Comings, RN Outcome: Progressing Goal: Will identify appropriate support needs 03/07/2024 2050 by Channie Comings, RN Outcome: Progressing 03/07/2024 2049 by Channie Comings, RN Outcome: Progressing   Problem: Health Behavior/Discharge Planning: Goal: Ability to manage health-related needs will improve 03/07/2024 2050 by Channie Comings, RN Outcome: Progressing 03/07/2024 2049 by Channie Comings, RN Outcome: Progressing Goal: Goals will be collaboratively established with patient/family 03/07/2024 2050 by Channie Comings, RN Outcome: Progressing 03/07/2024 2049 by Channie Comings, RN Outcome: Progressing   Problem: Self-Care: Goal: Ability to participate in self-care as condition permits will improve 03/07/2024 2050 by Channie Comings, RN Outcome: Progressing 03/07/2024 2049 by Channie Comings, RN Outcome: Progressing Goal: Verbalization of feelings and concerns over difficulty with self-care will improve 03/07/2024 2050 by Channie Comings, RN Outcome: Progressing 03/07/2024 2049 by Channie Comings, RN Outcome: Progressing Goal: Ability to communicate needs accurately will improve 03/07/2024 2050 by Channie Comings, RN Outcome: Progressing 03/07/2024 2049 by Channie Comings, RN Outcome: Progressing    Problem: Nutrition: Goal: Risk of aspiration will decrease 03/07/2024 2050 by Channie Comings, RN Outcome: Progressing 03/07/2024 2049 by Channie Comings, RN Outcome: Progressing Goal: Dietary intake will improve 03/07/2024 2050 by Channie Comings, RN Outcome: Progressing 03/07/2024 2049 by Channie Comings, RN Outcome: Progressing   Problem: Education: Goal: Knowledge of discharge needs will improve 03/07/2024 2050 by Channie Comings, RN Outcome: Progressing 03/07/2024 2049 by Channie Comings, RN Outcome: Progressing   Problem: Clinical Measurements: Goal: Postoperative complications will be avoided or minimized 03/07/2024 2050 by Channie Comings, RN Outcome: Progressing 03/07/2024 2049 by Channie Comings, RN Outcome: Progressing   Problem: Respiratory: Goal: Will achieve and/or maintain a regular respiratory rate, without signs or symptoms of dyspnea 03/07/2024 2050 by Channie Comings, RN Outcome: Progressing 03/07/2024 2049 by Channie Comings, RN Outcome: Progressing   Problem: Skin Integrity: Goal: Demonstration of wound healing without infection will improve 03/07/2024 2050 by Channie Comings, RN Outcome: Progressing 03/07/2024 2049 by Channie Comings, RN Outcome: Progressing

## 2024-03-07 NOTE — Interval H&P Note (Signed)
 History and Physical Interval Note:  03/07/2024 7:28 AM  [name]  has presented today for surgery, with the diagnosis of Symptomatic left carotid artery stenosis.  The various methods of treatment have been discussed with the patient and family. After consideration of risks, benefits and other options for treatment, the patient has consented to  Procedure(s): TRANSCAROTID ARTERY REVASCULARIZATION (TCAR) (Left) as a surgical intervention.  The patient's history has been reviewed, patient examined, no change in status, stable for surgery.  I have reviewed the patient's chart and labs.  Questions were answered to the patient's satisfaction.     [name]

## 2024-03-07 NOTE — Plan of Care (Signed)
  Problem: Education: Goal: Ability to describe self-care measures that may prevent or decrease complications (Diabetes Survival Skills Education) will improve Outcome: Progressing Goal: Individualized Educational Video(s) Outcome: Progressing   Problem: Coping: Goal: Ability to adjust to condition or change in health will improve Outcome: Progressing   Problem: Fluid Volume: Goal: Ability to maintain a balanced intake and output will improve Outcome: Progressing   Problem: Health Behavior/Discharge Planning: Goal: Ability to identify and utilize available resources and services will improve Outcome: Progressing Goal: Ability to manage health-related needs will improve Outcome: Progressing   Problem: Metabolic: Goal: Ability to maintain appropriate glucose levels will improve Outcome: Progressing   Problem: Nutritional: Goal: Maintenance of adequate nutrition will improve Outcome: Progressing Goal: Progress toward achieving an optimal weight will improve Outcome: Progressing   Problem: Skin Integrity: Goal: Risk for impaired skin integrity will decrease Outcome: Progressing   Problem: Tissue Perfusion: Goal: Adequacy of tissue perfusion will improve Outcome: Progressing   Problem: Education: Goal: Knowledge of General Education information will improve Description: Including pain rating scale, medication(s)/side effects and non-pharmacologic comfort measures Outcome: Progressing   Problem: Health Behavior/Discharge Planning: Goal: Ability to manage health-related needs will improve Outcome: Progressing   Problem: Clinical Measurements: Goal: Ability to maintain clinical measurements within normal limits will improve Outcome: Progressing Goal: Will remain free from infection Outcome: Progressing Goal: Diagnostic test results will improve Outcome: Progressing Goal: Respiratory complications will improve Outcome: Progressing Goal: Cardiovascular complication will  be avoided Outcome: Progressing   Problem: Activity: Goal: Risk for activity intolerance will decrease Outcome: Progressing   Problem: Nutrition: Goal: Adequate nutrition will be maintained Outcome: Progressing   Problem: Coping: Goal: Level of anxiety will decrease Outcome: Progressing   Problem: Elimination: Goal: Will not experience complications related to bowel motility Outcome: Progressing Goal: Will not experience complications related to urinary retention Outcome: Progressing   Problem: Pain Managment: Goal: General experience of comfort will improve and/or be controlled Outcome: Progressing   Problem: Safety: Goal: Ability to remain free from injury will improve Outcome: Progressing   Problem: Skin Integrity: Goal: Risk for impaired skin integrity will decrease Outcome: Progressing   Problem: Education: Goal: Knowledge of disease or condition will improve Outcome: Progressing Goal: Knowledge of secondary prevention will improve (MUST DOCUMENT ALL) Outcome: Progressing Goal: Knowledge of patient specific risk factors will improve (DELETE if not current risk factor) Outcome: Progressing   Problem: Ischemic Stroke/TIA Tissue Perfusion: Goal: Complications of ischemic stroke/TIA will be minimized Outcome: Progressing   Problem: Coping: Goal: Will verbalize positive feelings about self Outcome: Progressing Goal: Will identify appropriate support needs Outcome: Progressing   Problem: Health Behavior/Discharge Planning: Goal: Ability to manage health-related needs will improve Outcome: Progressing Goal: Goals will be collaboratively established with patient/family Outcome: Progressing   Problem: Self-Care: Goal: Ability to participate in self-care as condition permits will improve Outcome: Progressing Goal: Verbalization of feelings and concerns over difficulty with self-care will improve Outcome: Progressing Goal: Ability to communicate needs  accurately will improve Outcome: Progressing   Problem: Nutrition: Goal: Risk of aspiration will decrease Outcome: Progressing Goal: Dietary intake will improve Outcome: Progressing   Problem: Education: Goal: Knowledge of discharge needs will improve Outcome: Progressing   Problem: Clinical Measurements: Goal: Postoperative complications will be avoided or minimized Outcome: Progressing   Problem: Respiratory: Goal: Will achieve and/or maintain a regular respiratory rate, without signs or symptoms of dyspnea Outcome: Progressing   Problem: Skin Integrity: Goal: Demonstration of wound healing without infection will improve Outcome: Progressing

## 2024-03-07 NOTE — Anesthesia Preprocedure Evaluation (Signed)
 Anesthesia Evaluation  Patient identified by MRN, date of birth, ID band Patient awake    Reviewed: Allergy & Precautions, H&P , NPO status , Patient's Chart, lab work & pertinent test results  Airway Mallampati: II   Neck ROM: full    Dental   Pulmonary shortness of breath, sleep apnea    breath sounds clear to auscultation       Cardiovascular hypertension, + Peripheral Vascular Disease   Rhythm:regular Rate:Normal     Neuro/Psych TIA Neuromuscular disease CVA    GI/Hepatic hiatal hernia,GERD  ,,  Endo/Other  diabetes, Type 2    Renal/GU      Musculoskeletal  (+) Arthritis ,    Abdominal   Peds  Hematology   Anesthesia Other Findings   Reproductive/Obstetrics                             Anesthesia Physical Anesthesia Plan  ASA: 3  Anesthesia Plan: General   Post-op Pain Management:    Induction: Intravenous  PONV Risk Score and Plan: 3 and Ondansetron , Dexamethasone , Midazolam  and Treatment may vary due to age or medical condition  Airway Management Planned: Oral ETT  Additional Equipment: Arterial line  Intra-op Plan:   Post-operative Plan: Extubation in OR  Informed Consent: I have reviewed the patients History and Physical, chart, labs and discussed the procedure including the risks, benefits and alternatives for the proposed anesthesia with the patient or authorized representative who has indicated his/her understanding and acceptance.     Dental advisory given  Plan Discussed with: CRNA, Anesthesiologist and Surgeon  Anesthesia Plan Comments:        Anesthesia Quick Evaluation

## 2024-03-08 DIAGNOSIS — G459 Transient cerebral ischemic attack, unspecified: Secondary | ICD-10-CM | POA: Diagnosis not present

## 2024-03-08 DIAGNOSIS — N3000 Acute cystitis without hematuria: Secondary | ICD-10-CM | POA: Diagnosis not present

## 2024-03-08 DIAGNOSIS — R7989 Other specified abnormal findings of blood chemistry: Secondary | ICD-10-CM | POA: Diagnosis not present

## 2024-03-08 DIAGNOSIS — I6522 Occlusion and stenosis of left carotid artery: Secondary | ICD-10-CM | POA: Diagnosis not present

## 2024-03-08 LAB — CBC
HCT: 35.9 % — ABNORMAL LOW (ref 36.0–46.0)
Hemoglobin: 11.9 g/dL — ABNORMAL LOW (ref 12.0–15.0)
MCH: 29.2 pg (ref 26.0–34.0)
MCHC: 33.1 g/dL (ref 30.0–36.0)
MCV: 88.2 fL (ref 80.0–100.0)
Platelets: 189 10*3/uL (ref 150–400)
RBC: 4.07 MIL/uL (ref 3.87–5.11)
RDW: 13.2 % (ref 11.5–15.5)
WBC: 10.5 10*3/uL (ref 4.0–10.5)
nRBC: 0 % (ref 0.0–0.2)

## 2024-03-08 LAB — RENAL FUNCTION PANEL
Albumin: 2.5 g/dL — ABNORMAL LOW (ref 3.5–5.0)
Anion gap: 7 (ref 5–15)
BUN: 13 mg/dL (ref 8–23)
CO2: 23 mmol/L (ref 22–32)
Calcium: 8.1 mg/dL — ABNORMAL LOW (ref 8.9–10.3)
Chloride: 108 mmol/L (ref 98–111)
Creatinine, Ser: 1.14 mg/dL — ABNORMAL HIGH (ref 0.44–1.00)
GFR, Estimated: 48 mL/min — ABNORMAL LOW (ref >60)
Glucose, Bld: 173 mg/dL — ABNORMAL HIGH (ref 70–99)
Phosphorus: 3.5 mg/dL (ref 2.5–4.6)
Potassium: 4 mmol/L (ref 3.5–5.1)
Sodium: 138 mmol/L (ref 135–145)

## 2024-03-08 LAB — CULTURE, BLOOD (ROUTINE X 2)
Culture: NO GROWTH
Culture: NO GROWTH

## 2024-03-08 LAB — GLUCOSE, CAPILLARY
Glucose-Capillary: 149 mg/dL — ABNORMAL HIGH (ref 70–99)
Glucose-Capillary: 150 mg/dL — ABNORMAL HIGH (ref 70–99)

## 2024-03-08 MED ORDER — TRAMADOL HCL 50 MG PO TABS: 50.0000 mg | ORAL_TABLET | Freq: Two times a day (BID) | ORAL | 0 refills | Status: DC | PRN

## 2024-03-08 MED ORDER — ROSUVASTATIN CALCIUM 40 MG PO TABS: 40.0000 mg | ORAL_TABLET | Freq: Every day | ORAL | 1 refills | Status: AC

## 2024-03-08 MED ORDER — ACETAMINOPHEN 325 MG PO TABS: 650.0000 mg | ORAL_TABLET | Freq: Four times a day (QID) | ORAL | Status: AC | PRN

## 2024-03-08 MED ORDER — CLOPIDOGREL BISULFATE 75 MG PO TABS: 75.0000 mg | ORAL_TABLET | Freq: Every day | ORAL | 1 refills | Status: DC

## 2024-03-08 MED ORDER — OLMESARTAN-AMLODIPINE-HCTZ 40-10-12.5 MG PO TABS: 1.0000 | ORAL_TABLET | Freq: Every day | ORAL | Status: DC

## 2024-03-08 MED ORDER — ASPIRIN 81 MG PO TBEC: 81.0000 mg | DELAYED_RELEASE_TABLET | Freq: Every day | ORAL | Status: AC

## 2024-03-08 NOTE — Progress Notes (Signed)
 Physical Therapy Treatment Patient Details Name: [name] MRN: [id number] DOB: 08-30-1941 Today's Date: 03/08/2024   History of Present Illness 83 year old female who presented with dysuria. Patient was admitted with elevated troponin, NSTEMI, and urinary frequency. Code stroke was called on 5/6 with imaging unremarkable for acute findings. PMH: CVA, hyperlipidemia, DM II, HTN.    PT Comments  Pt resting in bed on arrival with daughter present and supportive, pleasant and agreeable to therapy session, with increased progress toward acute goals. Pt demonstrating bed mobility with up to minA to elevate trunk to EOB with slightly elevated HOB. Pt demonstrated step pivot transfer to [facility] with CGA for safety, with no physical assist needed. Pt able to statically stand to perform pericare for ~14min with CGA for safety and no LOB. Pt demonstrating gait this session without an AD and CGA for safety. Educated pt on time up out of chair importance, with pt agreeable to sitting in chair at end of session. Pt also educated on IS use with pt demonstrating and verbalizing understanding, continued mobility importance, and energy conservation techniques after discharge with pt verbalizing understanding. Pt continues to benefit from skilled PT services to progress toward functional mobility goals.      If plan is discharge home, recommend the following: A little help with walking and/or transfers;A little help with bathing/dressing/bathroom;Assist for transportation;Help with stairs or ramp for entrance   Can travel by private vehicle        Equipment Recommendations  None recommended by PT    Recommendations for Other Services       Precautions / Restrictions Precautions Precautions: Fall Restrictions Weight Bearing Restrictions Per Provider Order: No     Mobility  Bed Mobility Overal bed mobility: Needs Assistance Bed Mobility: Supine to Sit     Supine to sit: Min assist     General bed  mobility comments: minA to lift trunk    Transfers Overall transfer level: Needs assistance   Transfers: Sit to/from Stand, Bed to chair/wheelchair/BSC Sit to Stand: Supervision   Step pivot transfers: Contact guard assist       General transfer comment: no physcial assist needed, CGA for saftey    Ambulation/Gait Ambulation/Gait assistance: Contact guard assist Gait Distance (Feet): 315 Feet Assistive device: None Gait Pattern/deviations: Step-through pattern, Decreased stride length       General Gait Details: O2 92%-96% on RA, HR up to 95bpm with gait   Stairs             Wheelchair Mobility     Tilt Bed    Modified Rankin (Stroke Patients Only)       Balance Overall balance assessment: Needs assistance Sitting-balance support: No upper extremity supported, Feet supported, Feet unsupported Sitting balance-Leahy Scale: Fair     Standing balance support: No upper extremity supported, During functional activity Standing balance-Leahy Scale: Fair Standing balance comment: pt ableto statically stand fro ~34min to preform pericare with CGA for saftey with no physical assist needed                            Communication Communication Communication: No apparent difficulties  Cognition Arousal: Alert Behavior During Therapy: WFL for tasks assessed/performed   PT - Cognitive impairments: No apparent impairments                         Following commands: Intact      Cueing Cueing  Techniques: Verbal cues  Exercises      General Comments General comments (skin integrity, edema, etc.): VSS on RA      Pertinent Vitals/Pain Pain Assessment Pain Assessment: No/denies pain    Home Living                          Prior Function            PT Goals (current goals can now be found in the care plan section) Acute Rehab PT Goals PT Goal Formulation: With patient Time For Goal Achievement: 03/19/24 Progress towards  PT goals: Progressing toward goals    Frequency    Min 3X/week      PT Plan      Co-evaluation              AM-PAC PT "6 Clicks" Mobility   Outcome Measure  Help needed turning from your back to your side while in a flat bed without using bedrails?: None Help needed moving from lying on your back to sitting on the side of a flat bed without using bedrails?: None Help needed moving to and from a bed to a chair (including a wheelchair)?: None Help needed standing up from a chair using your arms (e.g., wheelchair or bedside chair)?: None Help needed to walk in hospital room?: A Little Help needed climbing 3-5 steps with a railing? : A Little 6 Click Score: 22    End of Session Equipment Utilized During Treatment: Gait belt Activity Tolerance: Patient tolerated treatment well Patient left: in chair;with call bell/phone within reach;with family/visitor present Nurse Communication: Mobility status PT Visit Diagnosis: Muscle weakness (generalized) (M62.81);Difficulty in walking, not elsewhere classified (R26.2);Unsteadiness on feet (R26.81)     Time: 9604-5409 PT Time Calculation (min) (ACUTE ONLY): 21 min  Charges:    $Gait Training: 8-22 mins PT General Charges $$ ACUTE PT VISIT: 1 Visit                     Forrest Iha 03/08/2024, 12:28 PM

## 2024-03-08 NOTE — Discharge Summary (Signed)
 Physician Discharge Summary  [name] NWG:[id number] DOB: [birth year] DOA: 03/03/2024  PCP: [name], FNP  Admit date: 03/03/2024 Discharge date: 03/08/2024  Time spent: 60 minutes  Recommendations for Outpatient Follow-up:  Follow-up with Dr. Ola Berger, cardiology.  Patient will be set up with a heart monitor on discharge per cardiology. Follow-up with vascular and vein specialist in 4 weeks. Follow-up with Dr. Janett Medin, neurology in 1 month. Follow-up with [name], FNP in 2 weeks.  On follow-up patient blood pressure need to be reassessed.  Patient will need a basic metabolic profile done to follow-up on electrolytes and renal function.   Discharge Diagnoses:  Principal Problem:   Acute cystitis Active Problems:   TIA (transient ischemic attack)   Symptomatic stenosis of left carotid artery   Essential hypertension   Hyperlipidemia associated with type 2 diabetes mellitus (HCC)   Gastroesophageal reflux disease without esophagitis   Hypertensive nephropathy   Aortic atherosclerosis (HCC)   Chronic kidney disease, stage 3a (HCC)   OSA on CPAP   Elevated troponin   Type 2 diabetes mellitus with hyperglycemia (HCC)   Mild protein malnutrition (HCC)   Transaminitis   Pseudohyponatremia   Hyperbilirubinemia   Class 2 obesity   PVC's (premature ventricular contractions)   Discharge Condition: Stable and improved.  Diet recommendation: Heart healthy  Filed Weights   03/05/24 0459 03/07/24 0700 03/08/24 0500  Weight: 106.1 kg 108.1 kg 107.9 kg    History of present illness:  HPI per Dr. Malvina Searle is a 83 y.o. female with medical history significant of abdominal bloating, abdominal pain, history of AKI, stage 3a CKD, carpal tunnel syndrome, atypical chest pain, community-acquired pneumonia, constipation, type 2 diabetes, diverticulosis, diverticulitis, frequent UTIs, history E. coli infection, GERD, hiatal hernia, H. pylori infection, colon polyps,  history of palpitations, history of herpes zoster, hyperlipidemia, hypertension, hypokalemia, chronic right knee pain, lightheadedness, history of morbid obesity now class II, osteoarthritis, osteoporosis, right hip pain, history of pneumonia pruritus, pyoderma, paraspinal muscle spasm, subacute intracranial hemorrhage, history of CVA, transient speech disturbance, vaginal discharge, vitamin D  deficiency who has been recently treated for UTI and is presented to the emergency department with complaints of dysuria and frequency.  No flank pain,  or hematuria.  No polyuria, polydipsia, polyphagia or blurred vision.  She was found to be febrile in the emergency department.  Denied rhinorrhea, sore throat, wheezing or hemoptysis.  No chest pain, palpitations, diaphoresis, PND, orthopnea or recent pitting edema of the lower extremities.  No abdominal pain, nausea, emesis, diarrhea, constipation, melena or hematochezia.    Lab work: Urinalysis only showed small hemoglobin, but was otherwise normal.  Coronavirus, influenza and RSV PCR was negative.  CBC showed white count 12.1, hemoglobin 13.2 g/dL platelets 578.  Lactic acid was normal twice.  CMP showed a glucose 198, BUN of 30, creatinine 1.09 and total bilirubin 1.6 mg/dL.  Electrolytes were normal after sodium and calcium  correction.  Total protein 6.5 and albumin 3.1 g/dL.  AST 54, ALT 52 and alkaline phosphatase 41 units/L.   Imaging: Portable 1 view chest radiograph with no active disease.   ED course: Initial vital signs were temperature 100.2 F, pulse 98, respirations 16, BP 152/69 mmHg O2 sat 97%.  The patient received ceftriaxone  1 g IVPB, acetaminophen  650 mg p.o. x 1 and 1000 mL normal saline bolus.  Hospital Course:  #1 TIA/symptomatic L ICA stenosis -Patient noted during the hospitalization to have a code stroke which was activated due to aphasia. -  CT head done with no acute abnormality, small vessel disease. - CT angiogram head and neck done  with atherosclerosis disease at both carotid bifurcations with 70% stenosis of both proximal ICA.  Atherosclerotic disease in the carotid siphon regions with stenosis estimated at 30 to 50% on both sides.  Atherosclerotic disease in the right V4 segment with stenosis estimated at 50%. - MRI negative for any acute abnormalities. - 2D echo done with a EF of 50 to 55%, calcified aortic valve, mild mitral stenosis, moderate aortic valve stenosis.  Atrial septum grossly normal. - LDL noted at 90 .-A1c 7.7. - Patient noted to be on aspirin  81 mg daily prior to admission currently on aspirin  81 mg daily and Plavix  75 mg daily. - Neurology recommended dual antiplatelet therapy with aspirin  and Plavix  following carotid stent for 6 months and then Plavix  alone with risk factor modification. - PT OT/SLP assessed patient and recommend home health therapies.. -Patient transferred to [facility] for evaluation by vascular surgery as aphasia was felt to be secondary to symptomatic left ICA stenosis needing revascularization. -Patient seen by vascular surgery and patient underwent TCAR, 03/07/2024.  -Patient followed by neurology and vascular surgery during the hospitalization and cleared for discharge. -Outpatient follow-up with neurology and vascular surgery.   2.  Acute cystitis -Patient noted to have been placed on Macrobid  in the outpatient setting for UTI, no cultures seen from PCPs office. - Repeat urinalysis done during this hospitalization negative. - Patient received 5 days of IV Rocephin  during the hospitalization.  -No further antibiotics needed on discharge.     3.  Elevated troponin/non-STEMI -Felt likely secondary to demand ischemia in the setting of acute infection. - 2D echo done with a EF of 50 to 55%, calcified aortic valve, mild mitral stenosis, moderate aortic valve stenosis.  Atrial septum grossly normal. - Patient seen by cardiology initially recommended outpatient coronary CTA. - Patient  underwent Lexiscan  Myoview  stress on 03/06/2024.  - Patient maintained on aspirin , Plavix , statin.  Patient is on losartan to be resumed on discharge per cardiology recommendations.  -Outpatient follow-up with cardiology.     4.  Hypertension -Patient noted to have bouts of soft blood pressure/hypotension with systolic in the 80s with complaints of dizziness or lightheadedness on 03/05/2024 and as such antihypertensive medications held. -BP improved during the hospitalization. -Antihypertensive medications will be resumed 2 to 3 days postdischarge. -Outpatient follow-up with PCP.   5.  GERD -Stable. - Patient maintained on PPI.     6.  Aortic atherosclerosis/hyperlipidemia - Patient maintained on statin.   7.  CKD stage IIIa -Stable.   8.  OSA -CPAP nightly.   9.  Diabetes mellitus type 2 -Hemoglobin A1c 7.7. - Patient states he was taken off diabetic medications and was being managed at home with diet. - Patient maintained on sliding scale insulin .   - Outpatient follow-up.       Procedures: Lexiscan  Myoview  stress test 03/06/2024 CT head 03/04/2024 CT angiogram head and neck 03/04/2024 2D echo 03/03/2024 MRI brain 03/04/2024 Chest x-ray 03/03/2024 TCAR/left carotid stent placement per vascular surgery: [name] 03/07/2024  Consultations: Cardiology: Dr. Avanell Bob 03/03/2024 Neurology: Dr. Doretta Gant 03/04/2024 Vascular surgery: [name] 03/06/2024  Discharge Exam: Vitals:   03/08/24 0819 03/08/24 1147  BP: (!) 128/58 (!) 112/54  Pulse: 64 69  Resp: 16 17  Temp: 98.2 F (36.8 C) 98.1 F (36.7 C)  SpO2: 96% 94%    General: NAD Cardiovascular: RRR no murmurs rubs or gallops.  No  JVD.  No lower extremity edema. Respiratory: Clear to auscultation bilaterally.  No wheezes, no crackles, no rhonchi.  Fair air movement.  Speaking in full sentences.  Discharge Instructions   Discharge Instructions     Ambulatory referral to Neurology   Complete by: As directed    An appointment is  requested in approximately: 4 weeks   Diet - low sodium heart healthy   Complete by: As directed    Increase activity slowly   Complete by: As directed       Allergies as of 03/08/2024       Reactions   Bee Venom Hives   Latex Hives, Itching   Codeine Other (See Comments)   Hallucinations   Hydrocodone Other (See Comments)   hallucinations   Oxycodone  Other (See Comments)   hallucinations        Medication List     STOP taking these medications    cyanocobalamin 1000 MCG tablet Commonly known as: VITAMIN B12   dapagliflozin  propanediol 5 MG Tabs tablet Commonly known as: Farxiga    estradiol 0.1 MG/GM vaginal cream Commonly known as: ESTRACE   hydrALAZINE  10 MG tablet Commonly known as: APRESOLINE    lidocaine  2 % solution Commonly known as: XYLOCAINE    magnesium  30 MG tablet   nitrofurantoin  (macrocrystal-monohydrate) 100 MG capsule Commonly known as: MACROBID    Womens 50+ Multi Vitamin Tabs       TAKE these medications    acetaminophen  325 MG tablet Commonly known as: TYLENOL  Take 2 tablets (650 mg total) by mouth every 6 (six) hours as needed for mild pain (pain score 1-3) (or Fever >/= 101).   aspirin  EC 81 MG tablet Take 1 tablet (81 mg total) by mouth daily. Swallow whole.   Blood Pressure Kit 1 Units by Does not apply route daily. Check blood pressure daily for hypertension dx code I10   clopidogrel  75 MG tablet Commonly known as: PLAVIX  Take 1 tablet (75 mg total) by mouth daily. Start taking on: Mar 09, 2024   Olmesartan -amLODIPine -HCTZ 40-10-12.5 MG Tabs Take 1 tablet by mouth daily. Start taking on: Mar 10, 2024 What changed: These instructions start on Mar 10, 2024. If you are unsure what to do until then, ask your doctor or other care provider.   OneTouch Delica Plus Lancet33G Misc USE TO CHECK BLOOD SUGAR TWICE DAILY   OneTouch Verio test strip Generic drug: glucose blood USE TO TEST 2 TIMES DAILY   pantoprazole  40 MG  tablet Commonly known as: PROTONIX  Take 40 mg by mouth daily.   predniSONE  10 MG tablet Commonly known as: DELTASONE  Take 10 mg by mouth as directed.   rosuvastatin  40 MG tablet Commonly known as: CRESTOR  Take 1 tablet (40 mg total) by mouth daily.   solifenacin  5 MG tablet Commonly known as: VESICARE  Take 1 tablet (5 mg total) by mouth daily.   traMADol  50 MG tablet Commonly known as: Ultram  Take 1 tablet (50 mg total) by mouth every 12 (twelve) hours as needed.       Allergies  Allergen Reactions   Bee Venom Hives   Latex Hives and Itching   Codeine Other (See Comments)    Hallucinations    Hydrocodone Other (See Comments)    hallucinations   Oxycodone  Other (See Comments)    hallucinations    Follow-up Information     Elmyra Haggard, MD Follow up.   Specialty: Cardiology Why: our scheduler will contact you to arrange a outpatient coronary CT, 3 day heart  monitor and follow up with Dr. Avanell Bob after that. Contact information: [address] [place] [address] [phone]         [facility] at [facility] Follow up in 4 week(s).   Specialty: Vascular Surgery Why: Office will call you to arrange your appt (sent). Contact information: [address], [address] [place]   [address] [phone]        [name], FNP. Schedule an appointment as soon as possible for a visit in 2 week(s).   Specialty: Internal Medicine Contact information: [address] [place] [address] [phone]         Lisabeth Rider, MD. Schedule an appointment as soon as possible for a visit in 1 month(s).   Specialties: Neurology, Radiology Contact information: [address] [place] [address] [phone]                  The results of significant diagnostics from this hospitalization (including imaging, microbiology, ancillary and laboratory) are listed below  for reference.    Significant Diagnostic Studies: HYBRID OR IMAGING ([facility] ONLY) Result Date: 03/07/2024 There is no interpretation for this exam.  This order is for images obtained during a surgical procedure.  Please See "Surgeries" Tab for more information regarding the procedure.   NM Myocar Multi W/Spect W/Wall Motion / EF Result Date: 03/06/2024 CLINICAL DATA:  83 year old female with chest pain. Abnormal EKG. Intermediate risk for coronary disease. EXAM: MYOCARDIAL IMAGING WITH SPECT (REST AND PHARMACOLOGIC-STRESS) GATED LEFT VENTRICULAR WALL MOTION STUDY LEFT VENTRICULAR EJECTION FRACTION TECHNIQUE: Standard myocardial SPECT imaging was performed after resting intravenous injection of 10 mCi Tc-107m tetrofosmin . Subsequently, intravenous infusion of Lexiscan  was performed under the supervision of the Cardiology staff. At peak effect of the drug, 30 mCi Tc-78m tetrofosmin  was injected intravenously and standard myocardial SPECT imaging was performed. Quantitative gated imaging was also performed to evaluate left ventricular wall motion, and estimate left ventricular ejection fraction. COMPARISON:  None Available. FINDINGS: Perfusion: Medium size region moderate decreased counts in the apical and mid segment of the anterior septal wall which are fixed on rest and stress. No evidence reversible ischemia. No ventricular dilatation. Wall Motion: Normal left ventricular wall motion. No left ventricular dilation. Left Ventricular Ejection Fraction: 38 % End diastolic volume 76 ml End systolic volume 46 ml IMPRESSION: 1. 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 No evidence reversible ischemia. 2. Normal left ventricular wall motion. 3. Left ventricular ejection fraction 38% 4. Non invasive risk stratification*: High ( primarily based on low ejection fraction). *2012 Appropriate Use Criteria for Coronary Revascularization Focused Update: J Am Coll Cardiol. 2012;59(9):857-881.  http://content.dementiazones.com.aspx?articleid=[email or web address] Electronically Signed   By: Deboraha Fallow M.D.   On: 03/06/2024 16:04   MR BRAIN WO CONTRAST Result Date: 03/04/2024 CLINICAL DATA:  Neuro deficit, acute, stroke suspected aphasia EXAM: MRI HEAD WITHOUT CONTRAST TECHNIQUE: Multiplanar, multiecho pulse sequences of the brain and surrounding structures were obtained without intravenous contrast. COMPARISON:  Same day CT head.  MRI head April 15, 24. FINDINGS: Brain: No acute infarction, acute hemorrhage, hydrocephalus, extra-axial collection or mass lesion. Small remote hemorrhage in the left posterior temoral-occipital region. Moderate T2/FLAIR hyperintensities the white matter, nonspecific but compatible with chronic microvascular ischemic disease. Vascular: Major arterial flow voids are maintained skull base. Skull and upper cervical spine: Normal marrow signal. Sinuses/Orbits: Clear sinuses.  No acute orbital findings. Other: No mastoid effusions.  IMPRESSION: No evidence of acute intracranial abnormality. Electronically Signed   By: Stevenson Elbe M.D.   On: 03/04/2024 20:52   CT ANGIO HEAD NECK W WO CM (CODE STROKE) Result Date: 03/04/2024 CLINICAL DATA:  Neuro deficit, acute, stroke suspected.  Aphasia. EXAM: CT ANGIOGRAPHY HEAD AND NECK WITH AND WITHOUT CONTRAST TECHNIQUE: Multidetector CT imaging of the head and neck was performed using the standard protocol during bolus administration of intravenous contrast. Multiplanar CT image reconstructions and MIPs were obtained to evaluate the vascular anatomy. Carotid stenosis measurements (when applicable) are obtained utilizing NASCET criteria, using the distal internal carotid diameter as the denominator. RADIATION DOSE REDUCTION: This exam was performed according to the departmental dose-optimization program which includes automated exposure control, adjustment of the mA and/or kV according to patient size and/or use of iterative reconstruction  technique. CONTRAST:  75mL OMNIPAQUE  IOHEXOL  350 MG/ML SOLN COMPARISON:  Head CT immediately prior FINDINGS: CTA NECK FINDINGS Aortic arch: Aortic atherosclerosis. Branching pattern is normal without flow limiting origin stenosis. Right carotid system: Common carotid artery widely patent to the bifurcation. Calcified plaque at the carotid bifurcation and ICA bulb. Minimal diameter of the proximal ICA is 1.5 mm. Compared to a more distal cervical ICA diameter of 5 mm, this indicates a 70% stenosis. Left carotid system: Common carotid artery is patent to the bifurcation. Soft and calcified plaque at the carotid bifurcation and ICA bulb. Minimal diameter of the proximal ICA is 1.5 mm. Compared to a distal cervical ICA diameter of 5 mm, this indicates a 70% stenosis. Vertebral arteries: There is plaque adjacent to both vertebral artery origins. No stenosis greater than 30%. The vessels are patent through the cervical region to the foramen magnum. Skeleton: Cervical spondylosis. Other neck: No mass or lymphadenopathy. Upper chest: Lung apices are clear. Review of the MIP images confirms the above findings CTA HEAD FINDINGS Anterior circulation: Both internal carotid arteries are patent through the skull base and siphon regions. There is siphon atherosclerosis with stenosis estimated at 30-50% on both sides. The anterior and middle cerebral vessels are patent. No large vessel occlusion. No aneurysm or vascular malformation. Posterior circulation: Both vertebral arteries are patent through the foramen magnum to the basilar artery. There is atherosclerotic disease in the right V4 segment with stenosis estimated at 50%. No basilar stenosis. Posterior circulation branch vessels are normal. Venous sinuses: Patent and normal. Anatomic variants: None significant. Review of the MIP images confirms the above findings IMPRESSION: 1. No intracranial large vessel occlusion. 2. Atherosclerotic disease at both carotid bifurcations with  70% stenosis of both proximal internal carotid arteries. 3. Atherosclerotic disease in the carotid siphon regions with stenosis estimated at 30-50% on both sides. 4. Atherosclerotic disease in the right V4 segment with stenosis estimated at 50%. 5. Aortic atherosclerosis. Aortic Atherosclerosis (ICD10-I70.0). Electronically Signed   By: Bettylou Brunner M.D.   On: 03/04/2024 15:08   CT HEAD CODE STROKE WO CONTRAST Result Date: 03/04/2024 CLINICAL DATA:  Code stroke. Neuro deficit, acute, stroke suspected. EXAM: CT HEAD WITHOUT CONTRAST TECHNIQUE: Contiguous axial images were obtained from the base of the skull through the vertex without intravenous contrast. RADIATION DOSE REDUCTION: This exam was performed according to the departmental dose-optimization program which includes automated exposure control, adjustment of the mA and/or kV according to patient size and/or use of iterative reconstruction technique. COMPARISON:  05/25/2023 FINDINGS: Brain: No abnormality seen affecting the brainstem or cerebellum. Cerebral hemispheres show advanced chronic small-vessel ischemic changes of the white matter. No sign of acute  infarction, mass lesion, hemorrhage, hydrocephalus or extra-axial collection. Vascular: There is atherosclerotic calcification of the major vessels at the base of the brain. Skull: Negative Sinuses/Orbits: Clear/normal Other: None ASPECTS (Alberta Stroke Program Early CT Score) - Ganglionic level infarction (caudate, lentiform nuclei, internal capsule, insula, M1-M3 cortex): 7 - Supraganglionic infarction (M4-M6 cortex): 3 Total score (0-10 with 10 being normal): 10 IMPRESSION: 1. No acute CT finding. Advanced chronic small-vessel ischemic changes of the cerebral hemispheric white matter. 2. Aspects is 10. 3. These results were communicated to Dr. Doretta Gant at 2:47 pm on 03/04/2024 by text page via the [facility] messaging system. Electronically Signed   By: Bettylou Brunner M.D.   On: 03/04/2024 14:49   ECHOCARDIOGRAM  COMPLETE Result Date: 03/03/2024    ECHOCARDIOGRAM REPORT   Patient Name:   TYELER DINES Date of Exam: 03/03/2024 Medical Rec #:  [id number]        Height:       67.0 in Accession #:    [id number]       Weight:       234.1 lb Date of Birth:  1941-10-08         BSA:          2.162 m Patient Age:    82 years         BP:           129/58 mmHg Patient Gender: F                HR:           82 bpm. Exam Location:  Inpatient Procedure: 2D Echo, Cardiac Doppler, Color Doppler and Intracardiac            Opacification Agent (Both Spectral and Color Flow Doppler were            utilized during procedure). Indications:    Elevated Troponin  History:        Patient has prior history of Echocardiogram examinations.                 Stroke, Signs/Symptoms:Shortness of Breath and Chest Pain; Risk                 Factors:Hypertension, Diabetes and Hyperlipidemia.  Sonographer:    Willey Harrier Referring Phys: [id number] JUSTIN B HOWERTER IMPRESSIONS  1. Left ventricular ejection fraction, by estimation, is 50 to 55%. The left ventricle has low normal function. The left ventricle has no regional wall motion abnormalities. There is moderate concentric left ventricular hypertrophy. Left ventricular diastolic function could not be evaluated. There is nonspecific abnormal septal motion.  2. Right ventricular systolic function is normal. The right ventricular size is normal. There is normal pulmonary artery systolic pressure.  3. The mitral valve is degenerative. Trivial mitral valve regurgitation. Mild mitral stenosis. Severe mitral annular calcification.  4. The aortic valve is calcified. There is moderate calcification of the aortic valve. There is moderate thickening of the aortic valve. Aortic valve regurgitation is not visualized. Moderate aortic valve stenosis. Aortic valve mean gradient measures 23.0 mmHg.  5. The inferior vena cava is normal in size with greater than 50% respiratory variability, suggesting right atrial pressure of  3 mmHg. Comparison(s): Prior images reviewed side by side. Conclusion(s)/Recommendation(s): LVEF overall unchanged from prior. No clear focal wall motion abnormalities with contrast, though there is some nonspecific septal motion. Moderate aortic stenosis, mild mitral stenosis. FINDINGS  Left Ventricle: Left ventricular ejection fraction, by estimation, is 50 to 55%. The left  ventricle has low normal function. The left ventricle has no regional wall motion abnormalities. Definity  contrast agent was given IV to delineate the left ventricular endocardial borders. The left ventricular internal cavity size was normal in size. There is moderate concentric left ventricular hypertrophy. Nonspecific abnormal septal motion. Left ventricular diastolic function could not be evaluated due to mitral annular calcification (moderate or greater). Left ventricular diastolic function could not be evaluated. Right Ventricle: The right ventricular size is normal. Right vetricular wall thickness was not well visualized. Right ventricular systolic function is normal. There is normal pulmonary artery systolic pressure. The tricuspid regurgitant velocity is 2.42 m/s, and with an assumed right atrial pressure of 3 mmHg, the estimated right ventricular systolic pressure is 26.4 mmHg. Left Atrium: Left atrial size was normal in size. Right Atrium: Right atrial size was normal in size. Pericardium: There is no evidence of pericardial effusion. Presence of epicardial fat layer. Mitral Valve: MVA 2.5 by PHT. The mitral valve is degenerative in appearance. Severe mitral annular calcification. Trivial mitral valve regurgitation. Mild mitral valve stenosis. MV peak gradient, 13.5 mmHg. The mean mitral valve gradient is 6.0 mmHg. Tricuspid Valve: The tricuspid valve is normal in structure. Tricuspid valve regurgitation is trivial. No evidence of tricuspid stenosis. Aortic Valve: The aortic valve is calcified. There is moderate calcification of the  aortic valve. There is moderate thickening of the aortic valve. Aortic valve regurgitation is not visualized. Moderate aortic stenosis is present. Aortic valve mean gradient measures 23.0 mmHg. Aortic valve peak gradient measures 36.7 mmHg. Aortic valve area, by VTI measures 1.35 cm. Pulmonic Valve: The pulmonic valve was not well visualized. Pulmonic valve regurgitation is mild. No evidence of pulmonic stenosis. Aorta: The aortic root and ascending aorta are structurally normal, with no evidence of dilitation. Venous: The inferior vena cava is normal in size with greater than 50% respiratory variability, suggesting right atrial pressure of 3 mmHg. IAS/Shunts: The atrial septum is grossly normal.  LEFT VENTRICLE PLAX 2D LVIDd:         4.79 cm   Diastology LVIDs:         3.18 cm   LV e' medial:    4.79 cm/s LV PW:         1.70 cm   LV E/e' medial:  18.6 LV IVS:        1.80 cm   LV e' lateral:   7.07 cm/s LVOT diam:     2.20 cm   LV E/e' lateral: 12.6 LV SV:         82 LV SV Index:   38 LVOT Area:     3.80 cm  RIGHT VENTRICLE             IVC RV Basal diam:  3.20 cm     IVC diam: 2.00 cm RV S prime:     13.80 cm/s TAPSE (M-mode): 1.8 cm LEFT ATRIUM           Index        RIGHT ATRIUM           Index LA Vol (A2C): 44.9 ml 20.76 ml/m  RA Area:     14.60 cm LA Vol (A4C): 52.5 ml 24.28 ml/m  RA Volume:   35.60 ml  16.46 ml/m  AORTIC VALVE AV Area (Vmax):    1.41 cm AV Area (Vmean):   1.45 cm AV Area (VTI):     1.35 cm AV Vmax:  303.00 cm/s AV Vmean:          227.000 cm/s AV VTI:            0.613 m AV Peak Grad:      36.7 mmHg AV Mean Grad:      23.0 mmHg LVOT Vmax:         112.00 cm/s LVOT Vmean:        86.300 cm/s LVOT VTI:          0.217 m LVOT/AV VTI ratio: 0.35  AORTA Ao Root diam: 3.30 cm Ao Asc diam:  3.30 cm MITRAL VALVE                TRICUSPID VALVE MV Area (PHT): 4.96 cm     TR Peak grad:   23.4 mmHg MV Area VTI:   2.24 cm     TR Vmax:        242.00 cm/s MV Peak grad:  13.5 mmHg MV Mean grad:   6.0 mmHg     SHUNTS MV Vmax:       1.84 m/s     Systemic VTI:  0.22 m MV Vmean:      116.0 cm/s   Systemic Diam: 2.20 cm MV Decel Time: 153 msec MV E velocity: 89.10 cm/s MV A velocity: 145.00 cm/s MV E/A ratio:  0.61 Sheryle Donning MD Electronically signed by Sheryle Donning MD Signature Date/Time: 03/03/2024/3:09:57 PM    Final    DG Chest Port 1 View Result Date: 03/03/2024 CLINICAL DATA:  Cough and fever EXAM: PORTABLE CHEST 1 VIEW COMPARISON:  12/15/2023 FINDINGS: Cardiac shadow is stable. Elevation the right hemidiaphragm is again seen and stable. No focal infiltrate or effusion is noted. No bony abnormality is noted. IMPRESSION: No active disease. Electronically Signed   By: Violeta Grey M.D.   On: 03/03/2024 03:21   DG Shoulder Right Result Date: 02/16/2024 CLINICAL DATA:  Right shoulder pain. EXAM: RIGHT SHOULDER - 2+ VIEW COMPARISON:  Right shoulder radiographs 03/02/2022 FINDINGS: Mild glenohumeral joint space narrowing. Mild-to-moderate peripheral glenoid degenerative osteophytosis. Mild-to-moderate acromioclavicular joint space narrowing and peripheral osteophytosis. No acute fracture is seen. No dislocation. 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 Electronically Signed   By: Bertina Broccoli M.D.   On: 02/16/2024 17:09    Microbiology: Recent Results (from the past 240 hours)  Resp panel by RT-PCR (RSV, Flu A&B, Covid) Anterior Nasal Swab     Status: None   Collection Time: 03/03/24  3:40 AM   Specimen: Anterior Nasal Swab  Result Value Ref Range Status   SARS Coronavirus 2 by RT PCR NEGATIVE NEGATIVE Final    Comment: (NOTE) SARS-CoV-2 target nucleic acids are NOT DETECTED.  The SARS-CoV-2 RNA is generally detectable in upper respiratory specimens during the acute phase of infection. The lowest concentration of SARS-CoV-2 viral copies this assay can detect is 138 copies/mL. A negative result does not preclude SARS-Cov-2 infection  and should not be used as the sole basis for treatment or other patient management decisions. A negative result may occur with  improper specimen collection/handling, submission of specimen other than nasopharyngeal swab, presence of viral mutation(s) within the areas targeted by this assay, and inadequate number of viral copies(<138 copies/mL). A negative result must be combined with clinical observations, patient history, and epidemiological information. The expected result is Negative.  Fact Sheet for Patients:  BloggerCourse.com  Fact Sheet for Healthcare Providers:  SeriousBroker.it  This test is no t yet approved or cleared by the [place]  [place] FDA and  has been authorized for detection and/or diagnosis of SARS-CoV-2 by FDA under an Emergency Use Authorization (EUA). This EUA will remain  in effect (meaning this test can be used) for the duration of the COVID-19 declaration under Section 564(b)(1) of the Act, 21 U.S.C.section 360bbb-3(b)(1), unless the authorization is terminated  or revoked sooner.       Influenza A by PCR NEGATIVE NEGATIVE Final   Influenza B by PCR NEGATIVE NEGATIVE Final    Comment: (NOTE) The Xpert Xpress SARS-CoV-2/FLU/RSV plus assay is intended as an aid in the diagnosis of influenza from Nasopharyngeal swab specimens and should not be used as a sole basis for treatment. Nasal washings and aspirates are unacceptable for Xpert Xpress SARS-CoV-2/FLU/RSV testing.  Fact Sheet for Patients: BloggerCourse.com  Fact Sheet for Healthcare Providers: SeriousBroker.it  This test is not yet approved or cleared by the [place]  FDA and has been authorized for detection and/or diagnosis of SARS-CoV-2 by FDA under an Emergency Use Authorization (EUA). This EUA will remain in effect (meaning this test can be used) for the duration of the COVID-19 declaration  under Section 564(b)(1) of the Act, 21 U.S.C. section 360bbb-3(b)(1), unless the authorization is terminated or revoked.     Resp Syncytial Virus by PCR NEGATIVE NEGATIVE Final    Comment: (NOTE) Fact Sheet for Patients: BloggerCourse.com  Fact Sheet for Healthcare Providers: SeriousBroker.it  This test is not yet approved or cleared by the [place]  FDA and has been authorized for detection and/or diagnosis of SARS-CoV-2 by FDA under an Emergency Use Authorization (EUA). This EUA will remain in effect (meaning this test can be used) for the duration of the COVID-19 declaration under Section 564(b)(1) of the Act, 21 U.S.C. section 360bbb-3(b)(1), unless the authorization is terminated or revoked.  Performed at [facility], [address], [place], [place] [address]   Blood culture (routine x 2)     Status: None   Collection Time: 03/03/24  3:45 AM   Specimen: BLOOD  Result Value Ref Range Status   Specimen Description   Final    BLOOD SITE NOT SPECIFIED Performed at [facility], [address] [place], [place] [address]    Special Requests   Final    BOTTLES DRAWN AEROBIC AND ANAEROBIC Blood Culture results may not be optimal due to an inadequate volume of blood received in culture bottles Performed at [facility], [address], [place], [place] [address]    Culture   Final    NO GROWTH 5 DAYS Performed at [facility], [address], [place], [place] [address]    Report Status 03/08/2024 FINAL  Final  Blood culture (routine x 2)     Status: None   Collection Time: 03/03/24  4:30 AM   Specimen: BLOOD  Result Value Ref Range Status   Specimen Description   Final    BLOOD LEFT ANTECUBITAL Performed at [facility], [address], [place], [place] [address]    Special Requests   Final    BOTTLES DRAWN AEROBIC AND ANAEROBIC Blood  Culture results may not be optimal due to an inadequate volume of blood received in culture bottles Performed at [facility], [address] [place], [place] [address]    Culture   Final    NO GROWTH 5 DAYS Performed at [facility], [address] [place], [place] [address]    Report Status 03/08/2024 FINAL  Final  Respiratory (~20 pathogens) panel by PCR     Status: None   Collection Time: 03/04/24  1:03 PM   Specimen: Nasopharyngeal Swab; Respiratory  Result Value Ref Range Status   Adenovirus NOT DETECTED NOT DETECTED Final   Coronavirus 229E NOT DETECTED NOT DETECTED Final    Comment: (NOTE) The Coronavirus on the Respiratory Panel, DOES NOT test for the novel  Coronavirus (2019 nCoV)    Coronavirus HKU1 NOT DETECTED NOT DETECTED Final   Coronavirus NL63 NOT DETECTED NOT DETECTED Final   Coronavirus OC43 NOT DETECTED NOT DETECTED Final   Metapneumovirus NOT DETECTED NOT DETECTED Final   Rhinovirus / Enterovirus NOT DETECTED NOT DETECTED Final   Influenza A NOT DETECTED NOT DETECTED Final   Influenza B NOT DETECTED NOT DETECTED Final   Parainfluenza Virus 1 NOT DETECTED NOT DETECTED Final   Parainfluenza Virus 2 NOT DETECTED NOT DETECTED Final   Parainfluenza Virus 3 NOT DETECTED NOT DETECTED Final   Parainfluenza Virus 4 NOT DETECTED NOT DETECTED Final   Respiratory Syncytial Virus NOT DETECTED NOT DETECTED Final   Bordetella pertussis NOT DETECTED NOT DETECTED Final   Bordetella Parapertussis NOT DETECTED NOT DETECTED Final   Chlamydophila pneumoniae NOT DETECTED NOT DETECTED Final   Mycoplasma pneumoniae NOT DETECTED NOT DETECTED Final    Comment: Performed at [facility], [address], [place], [place] [address]  Surgical pcr screen     Status: None   Collection Time: 03/06/24  4:54 PM   Specimen: Nasal Mucosa; Nasal Swab  Result Value Ref Range Status   MRSA, PCR NEGATIVE NEGATIVE Final   Staphylococcus aureus NEGATIVE NEGATIVE  Final    Comment: (NOTE) The Xpert SA Assay (FDA approved for NASAL specimens in patients 54 years of age and older), is one component of a comprehensive surveillance program. It is not intended to diagnose infection nor to guide or monitor treatment. Performed at [facility], [address] [place], [place] [address]      Labs: Basic Metabolic Panel: Recent Labs  Lab 03/03/24 0600 03/04/24 1805 03/05/24 0516 03/06/24 1411 03/07/24 0344 03/08/24 0345  NA  --  137 132* 139 139 138  K  --  3.8 3.5 4.8 3.9 4.0  CL  --  101 97* 103 107 108  CO2  --  26 22 23  21* 23  GLUCOSE  --  124* 160* 118* 134* 173*  BUN  --  29* 26* 21 18 13   CREATININE  --  1.17* 1.33* 1.05* 1.04* 1.14*  CALCIUM   --  8.9 8.8* 9.2 8.7* 8.1*  MG 1.7  --   --   --  1.8  --   PHOS  --   --   --  3.5 3.4 3.5   Liver Function Tests: Recent Labs  Lab 03/03/24 0345 03/05/24 0516 03/06/24 1411 03/07/24 0344 03/08/24 0345  AST 54* 37  --   --   --   ALT 52* 44  --   --   --   ALKPHOS 41 46  --   --   --   BILITOT 1.6* 1.3*  --   --   --   PROT 6.5 7.1  --   --   --   ALBUMIN 3.1* 3.4* 3.3* 2.8* 2.5*   No results for input(s): "LIPASE", "AMYLASE" in the last 168 hours. No results for input(s): "AMMONIA" in the last 168 hours. CBC: Recent Labs  Lab 03/03/24 0345 03/04/24 1805 03/05/24  5621 03/06/24 1411 03/07/24 0344 03/08/24 0345  WBC 12.1* 7.7 7.6 9.2 8.6 10.5  NEUTROABS 10.4*  --   --   --   --   --   HGB 13.2 14.1 14.6 14.8 12.6 11.9*  HCT 39.9 43.2 45.3 43.9 38.1 35.9*  MCV 88.9 89.1 90.2 86.6 86.8 88.2  PLT 181 192 222 213 175 189   Cardiac Enzymes: No results for input(s): "CKTOTAL", "CKMB", "CKMBINDEX", "TROPONINI" in the last 168 hours. BNP: BNP (last 3 results) Recent Labs    05/25/23 1836 09/19/23 1853 03/04/24 0941  BNP 356.8* 96.2 157.0*    ProBNP (last 3 results) No results for input(s): "PROBNP" in the last 8760 hours.  CBG: Recent Labs  Lab 03/07/24 1205  03/07/24 1634 03/07/24 2126 03/08/24 0615 03/08/24 1146  GLUCAP 141* 107* 122* 149* 150*       Signed:  Hilda Lovings MD.  [facility] 03/08/2024, 2:51 PM

## 2024-03-08 NOTE — Discharge Instructions (Signed)

## 2024-03-08 NOTE — Progress Notes (Signed)
 Progress Note  Patient Name: [name] Date of Encounter: 03/08/2024  Primary Cardiologist: None   Subjective   No chest pain or sob. "They are supposed to send me home today."  Inpatient Medications    Scheduled Meds:  aspirin   81 mg Oral Daily   Or   aspirin   300 mg Rectal Daily   clopidogrel   75 mg Oral Daily   insulin  aspart  0-15 Units Subcutaneous TID WC   pantoprazole   40 mg Oral Daily   rosuvastatin   40 mg Oral Daily   Continuous Infusions:  sodium chloride      cefTRIAXone  (ROCEPHIN )  IV 2 g (03/08/24 0558)   PRN Meds: sodium chloride , acetaminophen  **OR** acetaminophen , benzonatate , hydrALAZINE , labetalol , metoprolol  tartrate, morphine  injection, ondansetron , mouth rinse, traMADol    Vital Signs    Vitals:   03/08/24 0400 03/08/24 0500 03/08/24 0558 03/08/24 0819  BP: (!) 118/57  121/60 (!) 128/58  Pulse: 66  66 64  Resp: 17  17 16   Temp: 98.4 F (36.9 C)  98.4 F (36.9 C) 98.2 F (36.8 C)  TempSrc: Axillary   Oral  SpO2: 93%  92% 96%  Weight:  107.9 kg    Height:        Intake/Output Summary (Last 24 hours) at 03/08/2024 1005 Last data filed at 03/07/2024 2332 Gross per 24 hour  Intake 1535.66 ml  Output 1750 ml  Net -214.34 ml   Filed Weights   03/05/24 0459 03/07/24 0700 03/08/24 0500  Weight: 106.1 kg 108.1 kg 107.9 kg    Telemetry    Nsr with noise - Personally Reviewed  ECG    none - Personally Reviewed  Physical Exam   GEN: No acute distress.   Neck: No JVD; small/mod area of hematoma/ecchymosis below left CEA Cardiac: RRR, no murmurs, rubs, or gallops.  Respiratory: Clear to auscultation bilaterally. GI: Soft, nontender, non-distended  MS: No edema; No deformity. Neuro:  Nonfocal  Psych: Normal affect   Labs    Chemistry Recent Labs  Lab 03/03/24 0345 03/04/24 1805 03/05/24 0516 03/06/24 1411 03/07/24 0344 03/08/24 0345  NA 134*   < > 132* 139 139 138  K 4.1   < > 3.5 4.8 3.9 4.0  CL 102   < > 97* 103 107  108  CO2 22   < > 22 23 21* 23  GLUCOSE 198*   < > 160* 118* 134* 173*  BUN 30*   < > 26* 21 18 13   CREATININE 1.09*   < > 1.33* 1.05* 1.04* 1.14*  CALCIUM  8.3*   < > 8.8* 9.2 8.7* 8.1*  PROT 6.5  --  7.1  --   --   --   ALBUMIN 3.1*  --  3.4* 3.3* 2.8* 2.5*  AST 54*  --  37  --   --   --   ALT 52*  --  44  --   --   --   ALKPHOS 41  --  46  --   --   --   BILITOT 1.6*  --  1.3*  --   --   --   GFRNONAA 51*   < > 40* 53* 54* 48*  ANIONGAP 10   < > 13 13 11 7    < > = values in this interval not displayed.     Hematology Recent Labs  Lab 03/06/24 1411 03/07/24 0344 03/08/24 0345  WBC 9.2 8.6 10.5  RBC 5.07 4.39 4.07  HGB  14.8 12.6 11.9*  HCT 43.9 38.1 35.9*  MCV 86.6 86.8 88.2  MCH 29.2 28.7 29.2  MCHC 33.7 33.1 33.1  RDW 13.3 13.2 13.2  PLT 213 175 189    Cardiac EnzymesNo results for input(s): "TROPONINI" in the last 168 hours. No results for input(s): "TROPIPOC" in the last 168 hours.   BNP Recent Labs  Lab 03/04/24 0941  BNP 157.0*     DDimer No results for input(s): "DDIMER" in the last 168 hours.   Radiology    HYBRID OR IMAGING ([facility] ONLY) Result Date: 03/07/2024 There is no interpretation for this exam.  This order is for images obtained during a surgical procedure.  Please See "Surgeries" Tab for more information regarding the procedure.   NM Myocar Multi W/Spect W/Wall Motion / EF Result Date: 03/06/2024 CLINICAL DATA:  83 year old female with chest pain. Abnormal EKG. Intermediate risk for coronary disease. EXAM: MYOCARDIAL IMAGING WITH SPECT (REST AND PHARMACOLOGIC-STRESS) GATED LEFT VENTRICULAR WALL MOTION STUDY LEFT VENTRICULAR EJECTION FRACTION TECHNIQUE: Standard myocardial SPECT imaging was performed after resting intravenous injection of 10 mCi Tc-74m tetrofosmin . Subsequently, intravenous infusion of Lexiscan  was performed under the supervision of the Cardiology staff. At peak effect of the drug, 30 mCi Tc-57m tetrofosmin  was injected  intravenously and standard myocardial SPECT imaging was performed. Quantitative gated imaging was also performed to evaluate left ventricular wall motion, and estimate left ventricular ejection fraction. COMPARISON:  None Available. FINDINGS: Perfusion: Medium size region moderate decreased counts in the apical and mid segment of the anterior septal wall which are fixed on rest and stress. No evidence reversible ischemia. No ventricular dilatation. Wall Motion: Normal left ventricular wall motion. No left ventricular dilation. Left Ventricular Ejection Fraction: 38 % End diastolic volume 76 ml End systolic volume 46 ml IMPRESSION: 1. 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 No evidence reversible ischemia. 2. Normal left ventricular wall motion. 3. Left ventricular ejection fraction 38% 4. Non invasive risk stratification*: High ( primarily based on low ejection fraction). *2012 Appropriate Use Criteria for Coronary Revascularization Focused Update: J Am Coll Cardiol. 2012;59(9):857-881. http://content.dementiazones.com.aspx?articleid=1201161 Electronically Signed   By: Deboraha Fallow M.D.   On: 03/06/2024 16:04    Cardiac Studies   See above.   Patient Profile     83 y.o. female admitted with elevated troponin, low risk myoview , s/p CEA.  Assessment & Plan    Elevated troponin - no additional workup as she is s/p CEA. PVC's - she has an outpatient zio monitor scheduled. She can followup with Dr. Maylene Spear.  HTN - back on olmesartan  at discharge.     For questions or updates, please contact [facility] Please consult [email or web address] for contact info under Cardiology/STEMI.      Signed, Manya Sells, MD  03/08/2024, 10:05 AM

## 2024-03-08 NOTE — Progress Notes (Signed)
 RNCM received HHPT/OT orders for patient.  RNCM spoke to patient and offered choice for [facility] services. Patient with no preference so Alease Hunter at [facility] contacted with order and confirmation received.

## 2024-03-08 NOTE — Progress Notes (Addendum)
  Progress Note    03/08/2024 7:42 AM 1 Day Post-Op  Subjective:  says she feels good this morning.  She has voided.  She is not having any difficulty swallowing.  She has not walked.  She is not having any pain.    Afebrile HR 60's-70's  90's-140's systolic 93% RA  Gtts:  none  Vitals:   03/08/24 0400 03/08/24 0558  BP: (!) 118/57 121/60  Pulse: 66 66  Resp: 17 17  Temp: 98.4 F (36.9 C) 98.4 F (36.9 C)  SpO2: 93% 92%     Physical Exam: Neuro:  in tact; tongue is midline.  Moving all extremities equally Lungs:  non labored Incision:  left neck incision looks good without hematoma; right groin soft without hematoma.   CBC    Component Value Date/Time   WBC 10.5 03/08/2024 0345   RBC 4.07 03/08/2024 0345   HGB 11.9 (L) 03/08/2024 0345   HGB CANCELED 07/12/2021 1045   HCT 35.9 (L) 03/08/2024 0345   HCT CANCELED 07/12/2021 1045   PLT 189 03/08/2024 0345   PLT CANCELED 07/12/2021 1045   MCV 88.2 03/08/2024 0345   MCV CANCELED 07/12/2021 1045   MCH 29.2 03/08/2024 0345   MCHC 33.1 03/08/2024 0345   RDW 13.2 03/08/2024 0345   RDW CANCELED 07/12/2021 1045   LYMPHSABS 0.6 (L) 03/03/2024 0345   LYMPHSABS 2.8 12/30/2020 1300   MONOABS 0.7 03/03/2024 0345   EOSABS 0.4 03/03/2024 0345   EOSABS 0.4 12/30/2020 1300   BASOSABS 0.0 03/03/2024 0345   BASOSABS 0.1 12/30/2020 1300    BMET    Component Value Date/Time   NA 138 03/08/2024 0345   NA 141 06/08/2023 1522   K 4.0 03/08/2024 0345   CL 108 03/08/2024 0345   CO2 23 03/08/2024 0345   GLUCOSE 173 (H) 03/08/2024 0345   BUN 13 03/08/2024 0345   BUN 21 06/08/2023 1522   CREATININE 1.14 (H) 03/08/2024 0345   CALCIUM  8.1 (L) 03/08/2024 0345   GFRNONAA 48 (L) 03/08/2024 0345   GFRAA 54 (L) 06/01/2020 1609     Intake/Output Summary (Last 24 hours) at 03/08/2024 0742 Last data filed at 03/07/2024 2332 Gross per 24 hour  Intake 1535.66 ml  Output 1750 ml  Net -214.34 ml     Assessment/Plan:  This is a 83  y.o. female who is s/p left TCAR  1 Day Post-Op  -pt is doing well this am. -pt neuro exam is in tact -pt has not ambulated -pt has voided -f/u with [facility] in 4 weeks with carotid duplex on [name] clinic day.  -home on Plavix /statin/asa.  She did take one tramadol  last evening.  She states tylenol  has been working fine.  I have sent Tramadol  50mg  bid prn pain #6 to [facility] on [address]  Discussed that she does not need to pick this up unless she needs it.  She and her daughter expressed understanding.    Maryanna Smart, PA-C [facility] [phone]   I have interviewed and examined patient with PA and agree with assessment and plan above.   Rorey Hodges C. Vikki Graves, MD [facility] [place] Office: [phone] Pager: [phone]

## 2024-03-10 ENCOUNTER — Encounter (HOSPITAL_COMMUNITY): Payer: Self-pay

## 2024-03-10 ENCOUNTER — Encounter (HOSPITAL_COMMUNITY): Payer: Self-pay | Admitting: Surgery

## 2024-03-10 ENCOUNTER — Telehealth: Payer: Self-pay

## 2024-03-10 LAB — POCT ACTIVATED CLOTTING TIME
Activated Clotting Time: 233 s
Activated Clotting Time: 285 s

## 2024-03-10 NOTE — Telephone Encounter (Signed)
 Need OV in 1 month after her Cardiac CT has been done... pt currently wearing her monitor. Will send to scheduling.

## 2024-03-10 NOTE — Telephone Encounter (Signed)
-----   Message from [place] sent at 03/04/2024 12:03 PM EDT ----- Regarding: schedule 3 day ZIO and follow up Patient admitted to [facility], seen by Dr. Avanell Bob, per Dr. Avanell Bob "Outpt appt with me or APP (my nurse may be able to get in)   DO after 1.  3 D Zio   2.  CCTA"  I have placed order and try to arrange a coronary CTA at [facility] office (not the [facility] one since patient has too many PVCs for the [facility] scanner to get good image)  Need a 3 day ZIO for PVC burden, Dr. Avanell Bob to read  [name]. Please help with arrange follow up in 1-2 month with Dr. Avanell Bob or her APP once heart monitor and coronary CTA is completed.   Thanks Hao

## 2024-03-11 NOTE — Telephone Encounter (Signed)
 Will forward this message to our monitor tech/dept to further assist the pt with monitor concern/skin irritation.

## 2024-03-11 NOTE — Telephone Encounter (Signed)
 Patient skin is raw and irritated from tape following Carotid procedure. Instructed daughter to wait a few days for irritation to subside before applying the ZIO patch. She could also drop placement down about 1-2 inches if necessary.  If the patient should develop an allergic reaction to the ZIO adhesive, remove the patch and mail back to [facility] for processing.

## 2024-03-11 NOTE — Telephone Encounter (Signed)
 Pt's daughter [name] is requesting a callback at [phone] regarding pt skin being irritated so she doesn't have the patch on right now but she'd like to know is she supposed to still place it there or else where. Please advise

## 2024-03-12 ENCOUNTER — Telehealth: Payer: Self-pay

## 2024-03-12 NOTE — Telephone Encounter (Signed)
 FYI  Copied from CRM [id number]. Topic: General - Other >> Mar 12, 2024  9:42 AM Luane Rumps D wrote: Reason for CRM: Jenna with [facility] calling to notify that her home health services will be delayed as patient's daughter requested start on 5/15 instead of 5/14 which is outside of their 48 hr window.

## 2024-03-13 ENCOUNTER — Telehealth: Payer: Self-pay

## 2024-03-13 ENCOUNTER — Other Ambulatory Visit: Payer: Self-pay | Admitting: Internal Medicine

## 2024-03-13 DIAGNOSIS — G459 Transient cerebral ischemic attack, unspecified: Secondary | ICD-10-CM

## 2024-03-13 DIAGNOSIS — N3941 Urge incontinence: Secondary | ICD-10-CM

## 2024-03-13 DIAGNOSIS — M1712 Unilateral primary osteoarthritis, left knee: Secondary | ICD-10-CM

## 2024-03-13 MED ORDER — MISC. DEVICES MISC: 0 refills | Status: AC

## 2024-03-13 NOTE — Telephone Encounter (Signed)
 Copied from CRM [id number]. Topic: Clinical - Home Health Verbal Orders >> Mar 13, 2024 11:47 AM Lajean Pike wrote: Caller/Agency: Sreejesh/ [facility] Callback Number: [phone] Service Requested: Social Worker, Nurse, and Physical Therapy Once a week for 5 weeks  Frequency: Once a week for five weeks Any new concerns about the patient? Yes- Sreejesh requested a prescription be sent for shower chair and bedside comode.  Daughter, Leward Record may also be contacted at [phone].

## 2024-03-13 NOTE — Telephone Encounter (Signed)
 Rx faxed to [facility] [phone]

## 2024-03-17 ENCOUNTER — Inpatient Hospital Stay: Admitting: Internal Medicine

## 2024-03-17 ENCOUNTER — Ambulatory Visit (INDEPENDENT_AMBULATORY_CARE_PROVIDER_SITE_OTHER): Payer: Medicare Other | Admitting: Internal Medicine

## 2024-03-17 VITALS — BP 124/68 | HR 69 | Temp 98.0°F | Ht 66.0 in | Wt 228.0 lb

## 2024-03-17 DIAGNOSIS — R7989 Other specified abnormal findings of blood chemistry: Secondary | ICD-10-CM

## 2024-03-17 DIAGNOSIS — I6523 Occlusion and stenosis of bilateral carotid arteries: Secondary | ICD-10-CM | POA: Diagnosis not present

## 2024-03-17 DIAGNOSIS — E1165 Type 2 diabetes mellitus with hyperglycemia: Secondary | ICD-10-CM

## 2024-03-17 DIAGNOSIS — N1831 Chronic kidney disease, stage 3a: Secondary | ICD-10-CM | POA: Diagnosis not present

## 2024-03-17 DIAGNOSIS — Z87440 Personal history of urinary (tract) infections: Secondary | ICD-10-CM | POA: Diagnosis not present

## 2024-03-17 DIAGNOSIS — I700 Atherosclerosis of aorta: Secondary | ICD-10-CM

## 2024-03-17 DIAGNOSIS — I10 Essential (primary) hypertension: Secondary | ICD-10-CM

## 2024-03-17 DIAGNOSIS — N3001 Acute cystitis with hematuria: Secondary | ICD-10-CM

## 2024-03-17 DIAGNOSIS — N3941 Urge incontinence: Secondary | ICD-10-CM

## 2024-03-17 LAB — BASIC METABOLIC PANEL WITH GFR
BUN: 22 mg/dL (ref 6–23)
CO2: 26 meq/L (ref 19–32)
Calcium: 9.4 mg/dL (ref 8.4–10.5)
Chloride: 102 meq/L (ref 96–112)
Creatinine, Ser: 1.29 mg/dL — ABNORMAL HIGH (ref 0.40–1.20)
GFR: 38.83 mL/min — ABNORMAL LOW (ref >60.00)
Glucose, Bld: 121 mg/dL — ABNORMAL HIGH (ref 70–99)
Potassium: 3.9 meq/L (ref 3.5–5.1)
Sodium: 138 meq/L (ref 135–145)

## 2024-03-17 LAB — MICROALBUMIN / CREATININE URINE RATIO
Creatinine,U: 96 mg/dL
Microalb Creat Ratio: 71.3 mg/g — ABNORMAL HIGH (ref 0.0–30.0)
Microalb, Ur: 6.8 mg/dL — ABNORMAL HIGH (ref 0.0–1.9)

## 2024-03-17 NOTE — Progress Notes (Addendum)
 [facility] [address] [place] [address] Dept: [phone] Dept Fax: [phone]    Subjective:   [name] 1940/11/29 03/17/2024  Chief Complaint  Patient presents with   Follow-up    HPI: [name] is a 83 year old female who presents for routine chronic condition follow-up and hospital follow-up.  Patient had presented to the ER on 03/03/2024 for complaints of dysuria and urinary frequency.  She was noted to be febrile with temp 100.2 F.  Negative for COVID/flu/RSV.  CBC showed white count at 12.1, normal lactic acid.  CXR normal.  She received 5 days of IV Rocephin  during the hospitalization and no further antibiotics were needed upon discharge.  She was discharged on 03/08/2024.  During hospitalization patient had an activated code stroke due to sudden onset of aphasia.  CT head showed no acute abnormality.  CT angiogram head and neck noted atherosclerotic disease at both carotid bifurcations with 70% stenosis of both proximal ICA.  MRI negative for any acute abnormalities.  2D echo done with EF of 50 to 55%, calcified aortic valve, mild mitral valve stenosis, moderate aortic valve stenosis.  Neurology was consulted and recommended continuing aspirin  81 mg and Plavix  once daily PT/OT/SLP assessed patient and recommended home health therapies.  Vascular surgeon was consulted and patient underwent TCAR on 03/07/2024.  Patient does have scheduled outpatient follow-ups with neurology and vascular surgery scheduled. She is on statin therapy crestor  40mg  Po daily.  Patient also had elevated troponin levels during hospitalization, which were felt likely secondary due to demand ischemia in the setting of the acute infection.  She was seen by cardiology and underwent a Lexi scan stress on 03/06/2024.  They recommended follow-up outpatient for coronary CTA.  During hospitalization patient's blood pressures were soft, with  systolic in the 80s.  However this improved during her hospitalization.  Today, blood pressures remained stable.  She is not currently taking her blood pressure medication olmesartan -amlodipine -hydrochlorothiazide  40-10-12.5mg  once daily.  Today blood pressure is 124/68 without any blood pressure medication given today.   CKD stage III remains stable, will recheck BMP today.  Diabetes A1c in hospital was 7.7.  Diabetes stable with diet and exercise.  Daughter reports that since hospitalization blood sugars have been between 150-170.  Daughter is present with her at office visit today.   Patient is requesting a portable commode chair.  Patient does have limited mobility, and is confined to one level of the home environment.  There may or may not be a toilet on that level.  Patient is also requesting a walker to improve mobility for osteoarthritis of knees.  Due to the recent TIA, she does have PT OT and SLP outpatient.  Due to her osteoarthritis, this limits her on her mobility to walk and prevents the patient from completing mobility related ADLs within a reasonable timeframe.  A walker would be able to steady her gait and prevent her from future falls that could cause hip or femur fractures.  She is safely able to use a walker.  Lab Results  Component Value Date   HGBA1C 7.7 (H) 03/05/2024    Last BMP Lab Results  Component Value Date   NA 138 03/08/2024   K 4.0 03/08/2024   CO2 23 03/08/2024   GLUCOSE 173 (H) 03/08/2024   BUN 13 03/08/2024   CREATININE 1.14 (H) 03/08/2024   CALCIUM  8.1 (L) 03/08/2024   GFR 53.18 (L) 06/19/2014   EGFR 42 (L) 06/08/2023  GFRNONAA 48 (L) 03/08/2024   Last CBC Lab Results  Component Value Date   WBC 10.5 03/08/2024   HGB 11.9 (L) 03/08/2024   HCT 35.9 (L) 03/08/2024   MCV 88.2 03/08/2024   MCH 29.2 03/08/2024   RDW 13.2 03/08/2024   PLT 189 03/08/2024    Last lipids Lab Results  Component Value Date   CHOL 168 03/05/2024   HDL 39 (L)  03/05/2024   LDLCALC 90 03/05/2024   LDLDIRECT 162.2 11/21/2013   TRIG 195 (H) 03/05/2024   CHOLHDL 4.3 03/05/2024     The following portions of the patient's history were reviewed and updated as appropriate: past medical history, past surgical history, family history, social history, allergies, medications, and problem list.   Patient Active Problem List   Diagnosis Date Noted   PVC's (premature ventricular contractions) 03/06/2024   TIA (transient ischemic attack) 03/06/2024   Symptomatic stenosis of left carotid artery 03/06/2024   Acute cystitis 03/03/2024   Elevated troponin 03/03/2024   Type 2 diabetes mellitus with hyperglycemia (HCC) 03/03/2024   Mild protein malnutrition (HCC) 03/03/2024   Transaminitis 03/03/2024   Pseudohyponatremia 03/03/2024   Hyperbilirubinemia 03/03/2024   Class 2 obesity 03/03/2024   Hypoglycemia 06/23/2023   History of TIA (transient ischemic attack) 02/12/2023   OSA on CPAP 10/19/2022   Urge incontinence 08/23/2022   Chronic kidney disease, stage 3a (HCC) 07/17/2021   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   Aortic atherosclerosis (HCC) 01/03/2021   Class 2 severe obesity due to excess calories with serious comorbidity and body mass index (BMI) of 39.0 to 39.9 in adult (HCC) 01/03/2021   Vitamin D  deficiency 06/01/2020   History of arthroplasty of right knee 05/11/2020   Constipation 06/26/2018   Type 2 diabetes mellitus with other specified complication (HCC) 06/19/2014   OA (osteoarthritis) of knee 01/05/2012   LBBB (left bundle branch block) 07/26/2010   Gastroesophageal reflux disease without esophagitis 10/06/2008   History of colonic polyps 08/26/2008   Hyperlipidemia associated with type 2 diabetes mellitus (HCC) 06/03/2007   Essential hypertension 06/03/2007   Past Medical History:  Diagnosis Date   Abdominal bloating 02/17/2019   Abdominal pain    AKI (acute kidney injury) (HCC) 02/12/2023   Arthritis    cervical disc  degeneration/ oa left knee, carpal tunnel rt wrist, adhesive capsulitis right shoulder, rt hand weakness; lumbar degeneration   Carpal tunnel syndrome    CARPAL TUNNEL SYNDROME, RIGHT 10/14/2010   Qualifier: Diagnosis of   By: Nasario Badder MD, Sallyanne Creamer        CHEST PAIN, ATYPICAL 07/01/2010   Qualifier: Diagnosis of   By: Rodrick Clapper MD, Jackqulyn Masse acquired pneumonia 05/06/2020   Complication of anesthesia 2006-at [facility]   breathing problems-no BP med given prior to surgery;  hx of being very sleepy after colon surgery --  states no problems with last right total knee replacement 2013   Constipation    Diabetes mellitus without complication (HCC)    borderline - diet control   Diverticulitis hx of   Diverticulosis    E. coli infection    2020   Frequent UTI    hx of urethral injury during colon surgery - states frequent uti's since   GERD (gastroesophageal reflux disease)    Gout, unspecified 09/09/2007   Qualifier: Diagnosis of   By: Larrie Po MD, Wilmon Hashimoto        H. pylori infection 07/18/2010   H/O hiatal hernia  HIP PAIN, LEFT, CHRONIC 11/09/2010   Qualifier: Diagnosis of   By: Larrie Po MD, Wilmon Hashimoto        History of colon polyps 08/26/2008   History of palpitations    in the past   History of shingles    has a lingering itching on back where shingles were   Hyperlipidemia    Hypertension    Hypokalemia 10/19/2022   KNEE PAIN, RIGHT, CHRONIC 10/14/2010   Qualifier: Diagnosis of   By: Nasario Badder MD, Sallyanne Creamer        Lightheaded 02/17/2019   Morbidly obese (HCC) 05/31/2011   bmi of 44     Numbness and tingling in right hand    pt. 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   Shortness of  breath 09/14/2021   Shortness of breath 09/14/2021   Spasm of lumbar paraspinous muscle 06/17/2011   Stroke (HCC)    Stroke-like symptom    Subacute intracranial hemorrhage (HCC) 10/18/2022   Transient speech disturbance 04/18/2022   Urinary frequency 02/17/2019   Vaginal discharge 08/02/2011   Vitamin D  deficiency    Past Surgical History:  Procedure Laterality Date   ABDOMINAL HYSTERECTOMY     bladder tack     BREAST BIOPSY Left    BREAST SURGERY     breast duct resection- benign   CARPAL TUNNEL RELEASE Right 06/04/2014   Procedure: RIGHT CARPAL TUNNEL RELEASE;  Surgeon: Ronn Cohn, MD;  Location: [facility];  Service: Orthopedics;  Laterality: Right;   COLON RESECTION  2008   hx diverticulosis   COLON SURGERY     COLONOSCOPY     colonoscopy [id number]     JOINT REPLACEMENT     OOPHORECTOMY     POLYPECTOMY     skin graft left arm - traumatic compression injury left upper arm     temporary ureter stent     TOTAL KNEE ARTHROPLASTY  01/05/2012   Procedure: TOTAL KNEE ARTHROPLASTY;  Surgeon: Aurther Blue, MD;  Location: [facility];  Service: Orthopedics;  Laterality: Right;   TOTAL KNEE ARTHROPLASTY Left 08/17/2014   Procedure: LEFT TOTAL KNEE ARTHROPLASTY;  Surgeon: Aurther Blue, MD;  Location: [facility];  Service: Orthopedics;  Laterality: Left;   TRANSCAROTID ARTERY REVASCULARIZATION  Left 03/07/2024   Procedure: TRANSCAROTID ARTERY REVASCULARIZATION (TCAR);  Surgeon: Margherita Shell, MD;  Location: [facility] OR;  Service: Vascular;  Laterality: Left;   ureter repair for tyransected left ureter     Family History  Problem Relation Age of Onset   Breast cancer Mother 58   Hypertension Mother    Prostate cancer Father    Hypertension Father    Dementia Sister    Lung cancer Brother    Hypertension Brother    COPD Brother    Cancer Maternal Grandmother    Arthritis Sister    Hypertension Sister    Arthritis Sister    Hypertension Child    Hypertension  Child    Hypertension Child    Colon polyps Neg Hx    Esophageal cancer Neg Hx    Rectal cancer Neg Hx    Stomach cancer Neg Hx  Colon cancer Neg Hx     Current Outpatient Medications:    acetaminophen  (TYLENOL ) 325 MG tablet, Take 2 tablets (650 mg total) by mouth every 6 (six) hours as needed for mild pain (pain score 1-3) (or Fever >/= 101)., Disp: , Rfl:    aspirin  EC 81 MG tablet, Take 1 tablet (81 mg total) by mouth daily. Swallow whole., Disp: , Rfl:    Blood Pressure KIT, 1 Units by Does not apply route daily. Check blood pressure daily for hypertension dx code I10, Disp: 1 kit, Rfl: 0   clopidogrel  (PLAVIX ) 75 MG tablet, Take 1 tablet (75 mg total) by mouth daily., Disp: 90 tablet, Rfl: 1   Lancets (ONETOUCH DELICA PLUS LANCET33G) MISC, USE TO CHECK BLOOD SUGAR TWICE DAILY, Disp: 100 each, Rfl: 3   ONETOUCH VERIO test strip, USE TO TEST 2 TIMES DAILY, Disp: 100 strip, Rfl: 3   pantoprazole  (PROTONIX ) 40 MG tablet, Take 40 mg by mouth daily., Disp: , Rfl:    rosuvastatin  (CRESTOR ) 40 MG tablet, Take 1 tablet (40 mg total) by mouth daily., Disp: 90 tablet, Rfl: 1   solifenacin  (VESICARE ) 5 MG tablet, Take 1 tablet (5 mg total) by mouth daily., Disp: 90 tablet, Rfl: 1   traMADol  (ULTRAM ) 50 MG tablet, Take 1 tablet (50 mg total) by mouth every 12 (twelve) hours as needed., Disp: 6 tablet, Rfl: 0   Misc. Devices MISC, Bedside Commode.M17.12, N39.41, G45.9, Disp: 1 each, Rfl: 0   Misc. Devices MISC, Paediatric nurse. M17.12, N39.41, G45.9, Disp: 1 each, Rfl: 0   Olmesartan -amLODIPine -HCTZ 40-10-12.5 MG TABS, Take 1 tablet by mouth daily. (Patient not taking: Reported on 03/17/2024), Disp: , Rfl:  Allergies  Allergen Reactions   Bee Venom Hives   Latex Hives and Itching   Codeine Other (See Comments)    Hallucinations    Hydrocodone Other (See Comments)    hallucinations   Oxycodone  Other (See Comments)    hallucinations     ROS: A complete ROS was performed with pertinent  positives/negatives noted in the HPI. The remainder of the ROS are negative.    Objective:   Today's Vitals   03/17/24 1403  BP: 124/68  Pulse: 69  Temp: 98 F (36.7 C)  TempSrc: Temporal  SpO2: 98%  Weight: 228 lb (103.4 kg)  Height: 5\' 6"  (1.676 m)    GENERAL: Well-appearing, in NAD. Well nourished.  SKIN: Pink, warm and dry.  Approximated 4 cm linear surgical incision to left upper chest wall near clavicle with Dermabond intact.  No redness, warmth, or active drainage. NECK: Trachea midline. Full ROM w/o pain or tenderness. No lymphadenopathy.  RESPIRATORY: Chest wall symmetrical. Respirations even and non-labored. Breath sounds clear to auscultation bilaterally.  CARDIAC: S1, S2 present, regular rate and rhythm. Peripheral pulses 2+ bilaterally.  EXTREMITIES: Without clubbing, cyanosis, or edema.  NEUROLOGIC:  Steady, even gait.  PSYCH/MENTAL STATUS: Alert, oriented x 3. Cooperative, appropriate mood and affect.   Health Maintenance Due  Topic Date Due   Zoster Vaccines- Shingrix (1 of 2) Never done   Pneumonia Vaccine 39+ Years old (2 of 2 - PCV) 11/21/2014   Diabetic kidney evaluation - Urine ACR  07/12/2022   COVID-19 Vaccine (4 - 2024-25 season) 07/01/2023   DTaP/Tdap/Td (3 - Tdap) 11/22/2023   FOOT EXAM  01/01/2024    No results found for any visits on 03/17/24.  The ASCVD Risk score (Arnett DK, et al., 2019) failed to calculate for the following reasons:  The 2019 ASCVD risk score is only valid for ages 76 to 63   Risk score cannot be calculated because patient has a medical history suggesting prior/existing ASCVD     Assessment & Plan:  1. Acute cystitis with hematuria (Primary) - Resolved, received antibiotics during hospitalization and did not require further upon discharge  2. Bilateral carotid artery stenosis -Continue follow-up with vascular surgery as scheduled - Continue statin therapy, aspirin , and Plavix   3. Aortic atherosclerosis  (HCC) -Continue statin therapy - Continue follow-up with vascular surgery and cardiology as scheduled  4. Elevated troponin -Continue follow-up with cardiology as scheduled, elevated troponin levels secondary to acute infection  5. Essential hypertension -BP well-controlled at this time without hypertensive medication - Advised patient and daughter to check blood pressure prior to administering medication.  If blood pressure is 140/80 or lower to hold off on taking blood pressure medication  6. Type 2 diabetes mellitus with hyperglycemia, without long-term current use of insulin  (HCC) -Patient and daughter will check patient's blood sugar at least 1 time per day, if blood sugars remain elevated over the next several weeks, they will notify this PCP and we will start on glucose lowering agents.  Patient has been maintaining diabetes control with diet and exercise up until now. - Microalbumin / creatinine urine ratio  7. Chronic kidney disease, stage 3a (HCC) - Basic Metabolic Panel (BMET)  8. Urge incontinence -Continue Vesicare    Orders Placed This Encounter  Procedures   Microalbumin / creatinine urine ratio   Basic Metabolic Panel (BMET)   No images are attached to the encounter or orders placed in the encounter. No orders of the defined types were placed in this encounter.   Return in about 4 months (around 07/18/2024) for Chronic Condition follow up.   [name], FNP

## 2024-03-17 NOTE — Patient Instructions (Addendum)
 Check Blood pressure prior to taking blood pressure medication. If blood pressure is 140/80 or less, do NOT take blood pressure medication ( olmesartan -amlodipine -hydrochlorothiazide ) .    Check Blood sugar once a day, if blood sugars remain above 150 or higher, please notify me and schedule a video or in-person office visit

## 2024-03-19 ENCOUNTER — Encounter: Payer: Self-pay | Admitting: Neurology

## 2024-03-19 ENCOUNTER — Ambulatory Visit: Admitting: Neurology

## 2024-03-19 VITALS — BP 160/60 | HR 65 | Ht 67.0 in | Wt 231.0 lb

## 2024-03-19 DIAGNOSIS — I10 Essential (primary) hypertension: Secondary | ICD-10-CM | POA: Diagnosis not present

## 2024-03-19 DIAGNOSIS — E1122 Type 2 diabetes mellitus with diabetic chronic kidney disease: Secondary | ICD-10-CM | POA: Diagnosis not present

## 2024-03-19 DIAGNOSIS — I6522 Occlusion and stenosis of left carotid artery: Secondary | ICD-10-CM

## 2024-03-19 DIAGNOSIS — E785 Hyperlipidemia, unspecified: Secondary | ICD-10-CM

## 2024-03-19 DIAGNOSIS — G459 Transient cerebral ischemic attack, unspecified: Secondary | ICD-10-CM

## 2024-03-19 DIAGNOSIS — N1831 Chronic kidney disease, stage 3a: Secondary | ICD-10-CM

## 2024-03-19 NOTE — Patient Instructions (Addendum)
 CONTINUE ASPIRIN  AND PLAVIX  AS PER VASCULAR CONTINUE STATIN BLOOD PRESSURE CONTROL FOLLOW UP 4 MONTHS.   Mediterranean Diet A Mediterranean diet is based on the traditions of countries on the [employer]. It focuses on eating more: Fruits and vegetables. Whole grains, beans, nuts, and seeds. Heart-healthy fats. These are fats that are good for your heart. It involves eating less: Dairy. Meat and eggs. Processed foods with added sugar, salt, and fat. This type of diet can help prevent certain conditions. It can also improve outcomes if you have a long-term (chronic) disease, such as kidney or heart disease. What are tips for following this plan? Reading food labels Check packaged foods for: The serving size. For foods such as rice and pasta, the serving size is the amount of cooked product, not dry. The total fat. Avoid foods with saturated fat or trans fat. Added sugars, such as corn syrup. Shopping  Try to have a balanced diet. Buy a variety of foods, such as: Fresh fruits and vegetables. You may be able to get these from local farmers markets. You can also buy them frozen. Grains, beans, nuts, and seeds. Some of these can be bought in bulk. Fresh seafood. Poultry and eggs. Low-fat dairy products. Buy whole ingredients instead of foods that have already been packaged. If you can't get fresh seafood, buy precooked frozen shrimp or canned fish, such as tuna, salmon, or sardines. Stock your pantry so you always have certain foods on hand, such as olive oil, canned tuna, canned tomatoes, rice, pasta, and beans. Cooking Cook foods with extra-virgin olive oil instead of using butter or other vegetable oils. Have meat as a side dish. Have vegetables or grains as your main dish. This means having meat in small portions or adding small amounts of meat to foods like pasta or stew. Use beans or vegetables instead of meat in common dishes like chili or lasagna. Try out different  cooking methods. Try roasting, broiling, steaming, and sauting vegetables. Add frozen vegetables to soups, stews, pasta, or rice. Add nuts or seeds for added healthy fats and plant protein at each meal. You can add these to yogurt, salads, or vegetable dishes. Marinate fish or vegetables using olive oil, lemon juice, garlic, and fresh herbs. Meal planning Plan to eat a vegetarian meal one day each week. Try to work up to two vegetarian meals, if possible. Eat seafood two or more times a week. Have healthy snacks on hand. These may include: Vegetable sticks with hummus. Greek yogurt. Fruit and nut trail mix. Eat balanced meals. These should include: Fruit: 2-3 servings a day. Vegetables: 4-5 servings a day. Low-fat dairy: 2 servings a day. Fish, poultry, or lean meat: 1 serving a day. Beans and legumes: 2 or more servings a week. Nuts and seeds: 1-2 servings a day. Whole grains: 6-8 servings a day. Extra-virgin olive oil: 3-4 servings a day. Limit red meat and sweets to just a few servings a month. Lifestyle  Try to cook and eat meals with your family. Drink enough fluid to keep your pee (urine) pale yellow. Be active every day. This includes: Aerobic exercise, which is exercise that causes your heart to beat faster. Examples include running and swimming. Leisure activities like gardening, walking, or housework. Get 7-8 hours of sleep each night. Drink red wine if your provider says you can. A glass of wine is 5 oz (150 mL). You may be allowed to have: Up to 1 glass a day if you're female and not pregnant.  Up to 2 glasses a day if you're female. What foods should I eat? Fruits Apples. Apricots. Avocado. Berries. Bananas. Cherries. Dates. Figs. Grapes. Lemons. Melon. Oranges. Peaches. Plums. Pomegranate. Vegetables Artichokes. Beets. Broccoli. Cabbage. Carrots. Eggplant. Green beans. Chard. Kale. Spinach. Onions. Leeks. Peas. Squash. Tomatoes. Peppers. Radishes. Grains Whole-grain  pasta. Brown rice. Bulgur wheat. Polenta. Couscous. Whole-wheat bread. Dwyane Glad. Meats and other proteins Beans. Almonds. Sunflower seeds. Pine nuts. Peanuts. Cod. Salmon. Scallops. Shrimp. Tuna. Tilapia. Clams. Oysters. Eggs. Chicken or [place] without skin. Dairy Low-fat milk. Cheese. Greek yogurt. Fats and oils Extra-virgin olive oil. Avocado oil. Grapeseed oil. Beverages Water. Red wine. Herbal tea. Sweets and desserts Greek yogurt with honey. Baked apples. Poached pears. Trail mix. Seasonings and condiments Basil. Cilantro. Coriander. Cumin. Mint. Parsley. Sage. Rosemary. Tarragon. Garlic. Oregano. Thyme. Pepper. Balsamic vinegar. Tahini. Hummus. Tomato sauce. Olives. Mushrooms. The items listed above may not be all the foods and drinks you can have. Talk to a dietitian to learn more. What foods should I limit? This is a list of foods that should be eaten rarely. Fruits Fruit canned in syrup. Vegetables Deep-fried potatoes, like [place] fries. Grains Packaged pasta or rice dishes. Cereal with added sugar. Snacks with added sugar. Meats and other proteins Beef. Pork. Lamb. Chicken or [place] with skin. Hot dogs. Helene Loader. Dairy Ice cream. Sour cream. Whole milk. Fats and oils Butter. Canola oil. Vegetable oil. Beef fat (tallow). Lard. Beverages Juice. Sugar-sweetened soft drinks. Beer. Liquor and spirits. Sweets and desserts Cookies. Cakes. Pies. Candy. Seasonings and condiments Mayonnaise. Pre-made sauces and marinades. The items listed above may not be all the foods and drinks you should limit. Talk to a dietitian to learn more. Where to find more information American Heart Association (AHA): heart.org This information is not intended to replace advice given to you by your health care provider. Make sure you discuss any questions you have with your health care provider. Document Revised: 01/28/2023 Document Reviewed: 01/28/2023 Elsevier Patient Education  2024 [employer].

## 2024-03-19 NOTE — Progress Notes (Signed)
 [name]

## 2024-03-19 NOTE — Progress Notes (Addendum)
 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  ADDENDUM:  Correction - once cleared by vascular surgery, may discontinue Plavix  and continue ASA 81mg  daily monotherapy. Normotensive blood pressure - follow up with PCP Rosuvastatin  40mg  daily.  LDL goal less than 70.  Recheck lipid panel Glycemic control.  Hgb A1c goal less than 70 Follow up with vascular surgery as scheduled Mediterranean diet.   Follow up 4 months   Total time spent in chart, reviewing imaging and tests and face to face with patient: 49 minutes     Subjective:  [name] is an 83 year old female with HTN, DM II, CKD and history of TIA who follows up for recent hospital admission.  History supplemented by hospital records.  She is accompanied by her husband in person and daughter via phone.  UPDATE: Current medications:  ASA 81mg , Plavix  75mg , hydralazine , olmesartan -amlodipine -HCTZ, glipizide, rosuvastatin  40mg   On 03/04/2024, patient was admitted for urosepsis presenting with fever, dysuria, some confusion and generalized weakness, when she had sudden onset aphasia where she was unable to answer questions, perseverating and not following commands.  She had bilateral lower extremity weakness but no focal or unilateral weakness.  She did not receive TNK due to prior intracranial hemorrhage.  MRI of brain revealed no acute bleed or ischemic stroke.  CTA head and neck revealed moderate calcific 70% left carotid stenosis.  Underwent elective left carotid TCAR.   2D echo revealed EF 50-55% with calcified aortic valve.  LDL was 90 and Hgb A1c was 7.7.  Discharged on ASA and Plavix  with followup with vascular surgery.    Since discharge, she reports trouble remembering things.  Notes some lightheadedness and generalized weakness.  Cannot walk for prolonged period of time.  Has follow up with vascular surgery next month.  03/04/2024 CTA HEAD & NECK:  Atherosclerotic disease at both carotid bifurcations with 70% stenosis of both proximal internal carotid arteries.  Atherosclerotic disease in the carotid siphon regions with severe stenosis estimated at 30-50% on both sides.  Atherosclerotic disease in the right V4 segment with stenosis estimated at 50%. 03/04/2024 MRI BRAIN WO:  No acute infarction or acute hemorrhage.  Small remote hemorrhage in the left posterior temoral-occipital region. Moderate T2/FLAIR hyperintensities the white matter, nonspecific but compatible with chronic microvascular ischemic disease.  HISTORY: On 10/13/2022, she began experiencing acute onset of new headaches, described as severe paroxysmal right occipital-temporal stabbing headaches lasting a few seconds and occurring about every 10 minutes.  No associated nausea, vomiting, new visual disturbance, or unilateral numbness or weakness.  She took her blood pressure which was in the 200s systolic.  She went to the ED on 12/18 but left before being seen.  Headaches continued, so she returned to a different ED at [facility] on 12/20 where CT head revealed a 1.3 cm hyperdense focus within the right occipital lobe suspicious for hemorrhage.  CTA of head revealed no LVO or other vascular abnormality.  CTV of head revealed no cerebral venous sinus thrombosis.  She was transferred to [facility] for continued workup and management.  MRI of brain revealed early  subacute hemorrage within the right occipital lobe.  She was already on ASA 81mg , which was put on hold.  LDL 98.  Hgb A1c 6.4.  She was  admitted and monitored.  Repeat CT Head on 12/22 revealed hemorrhage was unchanged.  She was discharged in stable condition.  She is still not on ASA. Reports that she has trouble seeing, worse since the hospital but ongoing prior to the hospitalization.  She followed up with her eye doctor and exam is stable.    She began having a recurrence of headache in 2024.  On 02/11/2023, she started experiencing intermittent left arm and facial numbness.  She was admitted to [facility] where labs revealed AKI with BUN 37 and Cr 1.70.  CT head revealed no acute bleed or ischemic stroke.  MRI of brain revealed chronic small vessel ischemic changes and multiple scattered chronic microhemorrhages but no acute stroke.  MRA of head revealed no LVO or hemodynamically significant stenosis, aneurysm or other abnormality.  Carotid Doppler revealed no hemodynamically significant stenosis.  LDL was 198 and Hgb A1c 6.1.  She apparently was not taking the statin.  She was discharged on DAPT for 3 weeks and started on rosuvastatin  40mg .   10/18/2022 CT HEAD WO:  1.3 cm hyperdense focus within the right occipital lobe. This may reflect an acute parenchymal hemorrhage. However, a brain MRI without and with contrast is recommended to exclude a primary mass or metastatic lesion.  Moderate chronic small ischemic changes within the cerebral white matter. 10/18/2022 CTA/CTV HEAD:  1. No intracranial large vessel occlusion. No evidence of vascular malformation.  2. 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  3. Moderate stenosis in the right mid V4 segment.  4. No evidence of venous sinus thrombosis or stenosis. The right transverse and sigmoid sinus are hypoplastic, which appears similar to the 04/18/2022 CTA head. 10/18/2022 MRI BRAIN W WO:  1. Early subacute hemorrhage in the right occipital lobe, with a volume of approximately 1 mL. Surrounding edema without  significant mass effect or midline shift. No evidence of active hemorrhage.  2. Multiple foci of hemosiderin deposition, most prominently in the parietal and occipital lobes; with the exception of the new subacute hemorrhage, the others appear unchanged compared to 04/17/2022.  While these could be the sequela of hypertensive microhemorrhages, early cerebral amyloid angiopathy or multiple cavernomas could appear similar. 10/20/2022 CT HEAD WO:  Unchanged hemorrhage in the right occipital lobe, further characterized on recent MRI.  02/12/2023 MRI BRAIN WO:  1. No acute intracranial infarct or other abnormality. 2. 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 3. Multiple scattered chronic micro hemorrhages, most pronounced about the parieto-occipital regions bilaterally. Findings are favored to be hypertensive in nature. 02/12/2023 MRA HEAD:  1. Negative intracranial MRA for large vessel occlusion. 2. Mild intracranial atheromatous disease, but no hemodynamically significant or correctable stenosis. 02/12/2023 CAROTID US :  Right Carotid: Velocities in the right ICA are consistent with a 1-39% stenosis.  Left Carotid: Velocities in the left ICA are consistent with a 1-39% stenosis.  Vertebrals: Bilateral vertebral arteries demonstrate antegrade flow.  Subclavians: Normal flow hemodynamics were seen in bilateral subclavian    PAST MEDICAL HISTORY: Past Medical History:  Diagnosis Date   Abdominal bloating 02/17/2019   Abdominal pain    AKI (acute kidney injury) (HCC) 02/12/2023   Arthritis    cervical disc degeneration/ oa left knee, carpal tunnel rt wrist, adhesive capsulitis right shoulder,  rt hand weakness; lumbar degeneration   Carpal tunnel syndrome    CARPAL TUNNEL SYNDROME, RIGHT 10/14/2010   Qualifier: Diagnosis of   By: Eyvonne MD, Debby ORN        CHEST PAIN, ATYPICAL 07/01/2010   Qualifier: Diagnosis of   By: Domenica MD, Harlene Cahill acquired pneumonia  05/06/2020   Complication of anesthesia 2006-at [facility]   breathing problems-no BP med given prior to surgery;  hx of being very sleepy after colon surgery --  states no problems with last right total knee replacement 2013   Constipation    Diabetes mellitus without complication (HCC)    borderline - diet control   Diverticulitis hx of   Diverticulosis    E. coli infection    2020   Frequent UTI    hx of urethral injury during colon surgery - states frequent uti's since   GERD (gastroesophageal reflux disease)    Gout, unspecified 09/09/2007   Qualifier: Diagnosis of   By: Mavis MD, Norleen BRAVO        H. pylori infection 07/18/2010   H/O hiatal hernia    HIP PAIN, LEFT, CHRONIC 11/09/2010   Qualifier: Diagnosis of   By: Mavis MD, Norleen BRAVO        History of colon polyps 08/26/2008   History of palpitations    in the past   History of shingles    has a lingering itching on back where shingles were   Hyperlipidemia    Hypertension    Hypokalemia 10/19/2022   KNEE PAIN, RIGHT, CHRONIC 10/14/2010   Qualifier: Diagnosis of   By: Eyvonne MD, Debby ORN        Lightheaded 02/17/2019   Morbidly obese (HCC) 05/31/2011   bmi of 44     Numbness and tingling in right hand    pt. 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     aspirin  EC 81 MG tablet Take 1 tablet (81 mg total) by mouth daily. Swallow whole.     Blood Pressure KIT 1 Units by Does not apply route daily. Check blood pressure daily for hypertension dx code I10 1 kit 0   clopidogrel  (PLAVIX ) 75 MG tablet Take 1 tablet (75 mg total) by mouth daily. 90 tablet 1   Lancets (ONETOUCH DELICA PLUS LANCET33G) MISC USE TO CHECK BLOOD SUGAR TWICE DAILY 100 each 3   Misc. Devices MISC Bedside Commode.M17.12, N39.41, G45.9 1 each 0   Misc. Devices Engineer, civil (consulting).  M17.12, N39.41, G45.9 1 each 0   Olmesartan -amLODIPine -HCTZ 40-10-12.5 MG TABS Take 1 tablet by mouth daily.     ONETOUCH VERIO test strip USE TO TEST 2 TIMES DAILY 100 strip 3   pantoprazole  (PROTONIX ) 40 MG tablet Take 40 mg by mouth daily.     rosuvastatin  (CRESTOR ) 40 MG tablet Take 1 tablet (40 mg total) by mouth daily. 90 tablet 1   solifenacin  (VESICARE ) 5 MG tablet Take 1 tablet (5 mg total) by mouth daily. 90 tablet 1   traMADol  (ULTRAM ) 50 MG tablet Take 1 tablet (50 mg total) by mouth every 12 (twelve) hours as needed. 6 tablet 0   No current facility-administered medications on file prior to visit.    ALLERGIES: Allergies  Allergen Reactions   Bee Venom Hives   Latex Hives and Itching   Codeine Other (See Comments)    Hallucinations    Hydrocodone Other (See Comments)    hallucinations   Oxycodone  Other (See Comments)    hallucinations    FAMILY HISTORY: Family History  Problem Relation Age of Onset   Breast cancer Mother 52   Hypertension Mother    Prostate cancer Father    Hypertension Father    Dementia Sister    Lung cancer Brother    Hypertension Brother    COPD Brother    Cancer Maternal Grandmother    Arthritis Sister    Hypertension Sister     Arthritis Sister    Hypertension Child    Hypertension Child    Hypertension Child    Colon polyps Neg Hx    Esophageal cancer Neg Hx    Rectal cancer Neg Hx    Stomach cancer Neg Hx    Colon cancer Neg Hx       Objective:  Blood pressure (!) 160/60, pulse 65, height 5' 7 (1.702 m), weight 231 lb (104.8 kg), SpO2 95%. General: No acute distress.  Patient appears well-groomed.   Head:  Normocephalic/atraumatic Eyes:  Fundi examined but not visualized Neck: supple, no paraspinal tenderness, full range of motion Heart:  Regular rate and rhythm Neurological Exam: alert and oriented.  Speech fluent and not dysarthric, able to name, repeat and follow commands.  CN II-XII intact. Bulk and tone normal, muscle strength 5/5 throughout.  Sensation to light touch intact.  Deep tendon reflexes 2+ throughout.  Finger to nose testing intact.  Needs to use upper extremities to push up to stand.  Gait wide-based and cautious, Romberg with mild sway.   Juliene Dunnings, DO  CC: Rosina Senters, FNP

## 2024-03-20 ENCOUNTER — Telehealth: Payer: Self-pay

## 2024-03-20 NOTE — Telephone Encounter (Signed)
 Please advise  Copied from CRM [id number]. Topic: Clinical - Home Health Verbal Orders >> Mar 20, 2024  3:02 PM Chuck Crater wrote: Caller/Agency: April [facility] Callback Number: [phone] Service Requested: Skilled Nursing Frequency: weekly for 4 weeks  Any new concerns about the patient? No

## 2024-03-20 NOTE — Telephone Encounter (Signed)
 Approved for verbal orders

## 2024-03-20 NOTE — Telephone Encounter (Signed)
 April -informed of approved verbal orders

## 2024-03-21 ENCOUNTER — Telehealth: Payer: Self-pay

## 2024-03-21 NOTE — Telephone Encounter (Signed)
 Called to speak with patient's daughter to inform her Rx for shower bench and bed side commode was faxed on 03/13/24 also call [facility] to obtain  Update fax # was received Rx was faxed to updated fax #  Copied from CRM [id number]. Topic: General - Other >> Mar 21, 2024 12:34 PM Chuck Crater wrote: Reason for CRM: [name] stated that [facility] has not received an order for patient. She is calling for an update.

## 2024-03-25 ENCOUNTER — Ambulatory Visit: Payer: Self-pay | Admitting: Internal Medicine

## 2024-03-25 DIAGNOSIS — I493 Ventricular premature depolarization: Secondary | ICD-10-CM | POA: Diagnosis not present

## 2024-03-25 DIAGNOSIS — E1122 Type 2 diabetes mellitus with diabetic chronic kidney disease: Secondary | ICD-10-CM

## 2024-03-26 ENCOUNTER — Telehealth: Payer: Self-pay | Admitting: Internal Medicine

## 2024-03-26 NOTE — Telephone Encounter (Signed)
 Patient dropped off document Home Health Certificate (Order ID [id number]), to be filled out by provider. Patient requested to send it back via Fax within 7-days. Document is located in providers tray at front office.Please advise at [phone]

## 2024-03-26 NOTE — Telephone Encounter (Signed)
Forms received and placed in PCP folder.

## 2024-03-27 ENCOUNTER — Telehealth: Payer: Self-pay

## 2024-03-27 ENCOUNTER — Encounter (HOSPITAL_COMMUNITY): Payer: Self-pay

## 2024-03-27 DIAGNOSIS — M810 Age-related osteoporosis without current pathological fracture: Secondary | ICD-10-CM

## 2024-03-27 DIAGNOSIS — G8929 Other chronic pain: Secondary | ICD-10-CM

## 2024-03-27 DIAGNOSIS — I214 Non-ST elevation (NSTEMI) myocardial infarction: Secondary | ICD-10-CM | POA: Diagnosis not present

## 2024-03-27 DIAGNOSIS — M109 Gout, unspecified: Secondary | ICD-10-CM

## 2024-03-27 DIAGNOSIS — N3000 Acute cystitis without hematuria: Secondary | ICD-10-CM | POA: Diagnosis not present

## 2024-03-27 DIAGNOSIS — I700 Atherosclerosis of aorta: Secondary | ICD-10-CM

## 2024-03-27 DIAGNOSIS — G4733 Obstructive sleep apnea (adult) (pediatric): Secondary | ICD-10-CM

## 2024-03-27 DIAGNOSIS — Z48812 Encounter for surgical aftercare following surgery on the circulatory system: Secondary | ICD-10-CM | POA: Diagnosis not present

## 2024-03-27 DIAGNOSIS — I493 Ventricular premature depolarization: Secondary | ICD-10-CM

## 2024-03-27 DIAGNOSIS — E1165 Type 2 diabetes mellitus with hyperglycemia: Secondary | ICD-10-CM

## 2024-03-27 DIAGNOSIS — I129 Hypertensive chronic kidney disease with stage 1 through stage 4 chronic kidney disease, or unspecified chronic kidney disease: Secondary | ICD-10-CM | POA: Diagnosis not present

## 2024-03-27 DIAGNOSIS — N1831 Chronic kidney disease, stage 3a: Secondary | ICD-10-CM

## 2024-03-27 NOTE — Telephone Encounter (Signed)
 Please advise   Copied from CRM [id number]. Topic: Clinical - Medication Question >> Mar 27, 2024 11:13 AM Luane Rumps D wrote: Reason for CRM: April with [facility] is calling to request that FNP, [name] write an order for Ms. Jurado for a [employer], and to contact the patient and let her know if this is possible.

## 2024-03-28 ENCOUNTER — Other Ambulatory Visit: Payer: Self-pay | Admitting: Internal Medicine

## 2024-03-28 ENCOUNTER — Ambulatory Visit (HOSPITAL_COMMUNITY)
Admission: RE | Admit: 2024-03-28 | Discharge: 2024-03-28 | Disposition: A | Discharge status: Home / Self Care | Source: Ambulatory Visit | Attending: Physician Assistant | Admitting: Physician Assistant

## 2024-03-28 DIAGNOSIS — R079 Chest pain, unspecified: Secondary | ICD-10-CM | POA: Diagnosis present

## 2024-03-28 DIAGNOSIS — R931 Abnormal findings on diagnostic imaging of heart and coronary circulation: Secondary | ICD-10-CM | POA: Diagnosis present

## 2024-03-28 DIAGNOSIS — I2510 Atherosclerotic heart disease of native coronary artery without angina pectoris: Secondary | ICD-10-CM | POA: Diagnosis not present

## 2024-03-28 DIAGNOSIS — E1122 Type 2 diabetes mellitus with diabetic chronic kidney disease: Secondary | ICD-10-CM

## 2024-03-28 MED ORDER — IOHEXOL 350 MG/ML SOLN
100.0000 mL | Freq: Once | INTRAVENOUS | Status: AC | PRN
  Administered 2024-03-28: 100 mL via INTRAVENOUS

## 2024-03-28 MED ORDER — EMPAGLIFLOZIN 10 MG PO TABS: 10.0000 mg | ORAL_TABLET | Freq: Every day | ORAL | 3 refills | Status: DC

## 2024-03-28 MED ORDER — METOPROLOL TARTRATE 5 MG/5ML IV SOLN: 10.0000 mg | INTRAVENOUS | Status: DC | PRN

## 2024-03-28 MED ORDER — NITROGLYCERIN 0.4 MG SL SUBL
0.8000 mg | SUBLINGUAL_TABLET | Freq: Once | SUBLINGUAL | Status: AC
  Administered 2024-03-28: 0.8 mg via SUBLINGUAL

## 2024-03-28 MED ORDER — FREESTYLE LIBRE READER DEVI
0 refills | Status: AC
Start: 2024-03-28 — End: ?

## 2024-03-28 MED ORDER — DILTIAZEM HCL 25 MG/5ML IV SOLN
10.0000 mg | INTRAVENOUS | Status: DC | PRN
Start: 2024-03-28 — End: 2024-03-29

## 2024-03-28 MED ORDER — FREESTYLE LIBRE 3 PLUS SENSOR MISC
12 refills | Status: AC
Start: 2024-03-28 — End: ?

## 2024-03-28 NOTE — Telephone Encounter (Signed)
 Please notify the patient that I have gone ahead and sent in the freestyle [place] monitor and sensors.  However, she may have to pay out-of-pocket as her insurance may not cover this cost since she is not on any insulin .  Also, on her lab results, her urine sample did show some protein present in her urine, which is due to the diabetes and her kidney disease.  I do recommend starting her on a once daily Jardiance  to help with blood sugar control and kidney disease protection.  I have sent this into her pharmacy to start.  Most common side effect can be yeast infections, UTI -however not everyone experiences the symptoms.   I would like for her to get blood work in 1 month to check her kidney function after she start this medication.  Please schedule her for a lab appointment in 1 month.

## 2024-03-31 ENCOUNTER — Ambulatory Visit: Payer: Self-pay | Admitting: Physician Assistant

## 2024-03-31 ENCOUNTER — Other Ambulatory Visit: Payer: Self-pay | Admitting: Physician Assistant

## 2024-03-31 ENCOUNTER — Other Ambulatory Visit: Payer: Self-pay | Admitting: Cardiology

## 2024-03-31 ENCOUNTER — Ambulatory Visit (HOSPITAL_BASED_OUTPATIENT_CLINIC_OR_DEPARTMENT_OTHER)
Admission: RE | Admit: 2024-03-31 | Discharge: 2024-03-31 | Disposition: A | Discharge status: Home / Self Care | Source: Ambulatory Visit | Attending: Cardiology | Admitting: Cardiology

## 2024-03-31 DIAGNOSIS — I2510 Atherosclerotic heart disease of native coronary artery without angina pectoris: Secondary | ICD-10-CM

## 2024-03-31 DIAGNOSIS — R931 Abnormal findings on diagnostic imaging of heart and coronary circulation: Secondary | ICD-10-CM

## 2024-03-31 NOTE — Progress Notes (Signed)
 As discussed in the clinic, patient will need sublingual nitroglycerine sent to a pharmacy near her. We need to see her back as soon as she return from the beach. Avoid any heavy exertion. Seek urgent medical attention if she has worsening chest pain, shortness of breath or feeling of passing out.

## 2024-03-31 NOTE — Telephone Encounter (Signed)
 Spoke with patient's daughter to inform of message and she has questions about taking the Jariance because patient is prone to UTI's,  she would also like to know what stage of kidney function is patient in.

## 2024-03-31 NOTE — Progress Notes (Signed)
 See separate FFR report, plan to get the back return as soon as she is back in town to discuss result and likely cath.

## 2024-04-01 ENCOUNTER — Other Ambulatory Visit: Payer: Self-pay | Admitting: *Deleted

## 2024-04-01 ENCOUNTER — Other Ambulatory Visit: Payer: Self-pay

## 2024-04-01 DIAGNOSIS — Z8673 Personal history of transient ischemic attack (TIA), and cerebral infarction without residual deficits: Secondary | ICD-10-CM

## 2024-04-01 MED ORDER — NITROGLYCERIN 0.4 MG SL SUBL: 0.4000 mg | SUBLINGUAL_TABLET | SUBLINGUAL | 3 refills | Status: AC | PRN

## 2024-04-01 MED ORDER — NITROGLYCERIN 0.4 MG SL SUBL: 0.4000 mg | SUBLINGUAL_TABLET | SUBLINGUAL | 3 refills | Status: DC | PRN

## 2024-04-02 ENCOUNTER — Telehealth: Payer: Self-pay | Admitting: Physician Assistant

## 2024-04-02 NOTE — Telephone Encounter (Signed)
 Patients daughter informed of message

## 2024-04-02 NOTE — Telephone Encounter (Signed)
 She is in Stage 3 kidney disease. Her levels have been at stage 3 for several years and has remained stable. If they prefer to hold off on Jardiance  at this time we can, and see how her blood sugar levels are at her next follow up.

## 2024-04-02 NOTE — Telephone Encounter (Signed)
 Called and spoke to patient and daughters made  the patient and daughters aware of symptoms of heart attack. Symptoms denied at this time. Educated patient on how to take Nitroglycerin  and understanding verbalized. Made patient aware that appt scheduled for 6/11 and continue to write down questions for provider. Understanding verbalized.

## 2024-04-02 NOTE — Telephone Encounter (Signed)
 Pt daughter called in asking what type of symptoms should they look out for that could be possibly a heart attack and when should pt take Nitroglycerin . She denies any symptoms right now, she is just asking for future knowledge.

## 2024-04-03 ENCOUNTER — Ambulatory Visit: Payer: Medicare Other | Admitting: Neurology

## 2024-04-04 ENCOUNTER — Telehealth: Payer: Self-pay | Admitting: Internal Medicine

## 2024-04-04 NOTE — Telephone Encounter (Signed)
Forms filled and faxed

## 2024-04-04 NOTE — Telephone Encounter (Signed)
 Patient dropped off document Home Health Certificate (Order ID [id number]), to be filled out by provider. Patient requested to send it back via Fax within 7-days. Document is located in providers tray at front office.Please advise at [phone]

## 2024-04-08 ENCOUNTER — Other Ambulatory Visit (HOSPITAL_COMMUNITY)

## 2024-04-09 ENCOUNTER — Ambulatory Visit: Attending: Physician Assistant | Admitting: Physician Assistant

## 2024-04-09 ENCOUNTER — Encounter: Payer: Self-pay | Admitting: Physician Assistant

## 2024-04-09 ENCOUNTER — Ambulatory Visit: Payer: Medicare Other | Admitting: Podiatry

## 2024-04-09 ENCOUNTER — Other Ambulatory Visit: Payer: Self-pay | Admitting: Physician Assistant

## 2024-04-09 ENCOUNTER — Encounter: Payer: Self-pay | Admitting: Podiatry

## 2024-04-09 VITALS — BP 112/60 | HR 69 | Ht 66.0 in | Wt 227.8 lb

## 2024-04-09 DIAGNOSIS — I10 Essential (primary) hypertension: Secondary | ICD-10-CM | POA: Diagnosis not present

## 2024-04-09 DIAGNOSIS — T148XXA Other injury of unspecified body region, initial encounter: Secondary | ICD-10-CM

## 2024-04-09 DIAGNOSIS — M79609 Pain in unspecified limb: Secondary | ICD-10-CM

## 2024-04-09 DIAGNOSIS — E1122 Type 2 diabetes mellitus with diabetic chronic kidney disease: Secondary | ICD-10-CM | POA: Diagnosis not present

## 2024-04-09 DIAGNOSIS — B351 Tinea unguium: Secondary | ICD-10-CM

## 2024-04-09 DIAGNOSIS — R931 Abnormal findings on diagnostic imaging of heart and coronary circulation: Secondary | ICD-10-CM

## 2024-04-09 DIAGNOSIS — N1831 Chronic kidney disease, stage 3a: Secondary | ICD-10-CM

## 2024-04-09 DIAGNOSIS — R079 Chest pain, unspecified: Secondary | ICD-10-CM

## 2024-04-09 DIAGNOSIS — Z8673 Personal history of transient ischemic attack (TIA), and cerebral infarction without residual deficits: Secondary | ICD-10-CM

## 2024-04-09 DIAGNOSIS — I493 Ventricular premature depolarization: Secondary | ICD-10-CM | POA: Diagnosis not present

## 2024-04-09 DIAGNOSIS — S99921A Unspecified injury of right foot, initial encounter: Secondary | ICD-10-CM | POA: Diagnosis not present

## 2024-04-09 DIAGNOSIS — M205X9 Other deformities of toe(s) (acquired), unspecified foot: Secondary | ICD-10-CM

## 2024-04-09 DIAGNOSIS — E119 Type 2 diabetes mellitus without complications: Secondary | ICD-10-CM

## 2024-04-09 DIAGNOSIS — E1169 Type 2 diabetes mellitus with other specified complication: Secondary | ICD-10-CM

## 2024-04-09 DIAGNOSIS — E785 Hyperlipidemia, unspecified: Secondary | ICD-10-CM

## 2024-04-09 LAB — CBC
Hematocrit: 44.3 % (ref 34.0–46.6)
Hemoglobin: 14.4 g/dL (ref 11.1–15.9)
MCH: 28.9 pg (ref 26.6–33.0)
MCHC: 32.5 g/dL (ref 31.5–35.7)
MCV: 89 fL (ref 79–97)
Platelets: 185 10*3/uL (ref 150–450)
RBC: 4.98 x10E6/uL (ref 3.77–5.28)
RDW: 13.2 % (ref 11.7–15.4)
WBC: 9.7 10*3/uL (ref 3.4–10.8)

## 2024-04-09 NOTE — H&P (View-Only) (Signed)
 Cardiology Office Note   Date:  04/09/2024  ID:  [name], [name] 1941-05-22, MRN [id number] PCP: [name], FNP  [name] Cardiologist:  None     History of Present Illness [name] is a 83 y.o. female with past medical history of hypertension, hyperlipidemia, DM2, history of CVA, intracranial hemorrhage 12/23, carotid artery disease s/p recent left TCAR, LBBB and coronary artery calcification.  Myoview  in October 2018 showed EF 50%, low risk study with normal perfusion.  Echocardiogram in June 2023 and also in April 2024 showed EF 50 to 55%. 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 Neurology was consulted at the time and believe the finding was due to poorly controlled high blood pressure.  Patient was previously seen by Dr. [name] in November 2022 and established with Dr. [name] of [facility].  Recently, patient went to the [facility] ED due to increased urinary frequency.  She had a low-grade fever of 100.2.  Lactic acid was normal.  Serial troponin however was elevated, therefore cardiology service was consulted.  She tested negative for COVID, RSV and influenza.  Urinalysis was also negative.  She received IV Rocephin  due to fever of unknown etiology.  On telemetry, she had fairly frequent PVCs, therefore we were unable to obtain coronary CT.  Dr. Avanell Bob recommended outpatient coronary CTA and the cardiology service will sign off.  Later during the admission, she was found to have greater than 70% left carotid artery stenosis and was seen by vascular surgery.  Cardiology service consulted for preop clearance and she underwent nuclear stress test that showed EF 38%, small to mid sized fixed defect in the  distal anteroseptal wall, otherwise no ischemia.  Patient eventually underwent left carotid stenting by [name] on 03/07/2024.  She was discharged with a home monitor for frequent PVCs.  Her monitor showed sinus rhythm with PVC burden of 7.1%, no significant arrhythmia.  She eventually underwent outpatient coronary CTA on 03/28/2024 which showed 50 to 69% proximal RCA lesion, 50 to 69% mid RCA lesion, 25 to 49% distal RCA lesion, 50% left main lesion 25 to 49% proximal LAD lesion, 25 to 49% mid LAD lesion, 25 to 49% D3 lesion, 25 to 29% proximal left circumflex lesion.  Subsequent FFR showed 0.74 across the proximal left main lesion suggesting the lesion was hemodynamically significant, FFR falls from 0.74-0.69 in proximal LAD, CT FFR further falls to 0.56 across the lesion in mid LAD, he had FFR of 0.92 in proximal RCA which falls to 0.74 across the mid RCA lesion suggesting it is hemodynamically significant.  Due to hemodynamically significant lesion left main in the mid RCA, and borderline significant FFR in mid LAD, cardiac catheterization was recommended.  Patient presents today accompanied by daughter and husband.  We reviewed coronary CT result.  Although she occasionally has some burning sensation across her chest, this typically occurs at rest and goes away with exertion.  I suspect she has some acid reflux.  Her EKG shows T wave inversion in the inferolateral leads, left bundle branch block.  I reviewed her case with DOD Dr. [name].  We recommend she proceed with cardiac catheterization for definitive evaluation.  Risk and benefit of the cardiac catheterization has been discussed with the patient who is willing to proceed.  She  is already on aspirin  and Plavix .  She has not been taking her Jardiance  and plans to discuss with her PCP regarding discontinuation of Jardiance .  ROS:   She denies palpitations, dyspnea, pnd, orthopnea, n, v, dizziness, syncope, edema, weight gain, or early satiety. All  other systems reviewed and are otherwise negative except as noted above.   She describe occasional burning sensation in the chest.  Symptom happens at rest, does not occur with exertion.  Studies Reviewed EKG Interpretation Date/Time:  Wednesday April 09 2024 12:04:10 EDT Ventricular Rate:  69 PR Interval:  152 QRS Duration:  156 QT Interval:  438 QTC Calculation: 469 R Axis:   54  Text Interpretation: Sinus rhythm with occasional Premature ventricular complexes Left bundle branch block When compared with ECG of 04-Mar-2024 15:33, No significant change was found Confirmed by [name] [id number]) on 04/09/2024 12:52:59 PM    Cardiac Studies & Procedures   ______________________________________________________________________________________________   STRESS TESTS  NM MYOCAR MULTI W/SPECT W 03/06/2024  Narrative CLINICAL DATA:  83 year old female with chest pain. Abnormal EKG. Intermediate risk for coronary disease.  EXAM: MYOCARDIAL IMAGING WITH SPECT (REST AND PHARMACOLOGIC-STRESS)  GATED LEFT VENTRICULAR WALL MOTION STUDY  LEFT VENTRICULAR EJECTION FRACTION  TECHNIQUE: Standard myocardial SPECT imaging was performed after resting intravenous injection of 10 mCi Tc-98m tetrofosmin . Subsequently, intravenous infusion of Lexiscan  was performed under the supervision of the Cardiology staff. At peak effect of the drug, 30 mCi Tc-25m tetrofosmin  was injected intravenously and standard myocardial SPECT imaging was performed. Quantitative gated imaging was also performed to evaluate left ventricular wall motion, and estimate left ventricular ejection fraction.  COMPARISON:  None Available.  FINDINGS: Perfusion: Medium size region moderate decreased counts in the apical and mid segment of the anterior septal wall which are fixed on rest and stress. No evidence reversible ischemia. No ventricular dilatation.  Wall Motion: Normal left ventricular wall motion. No  left ventricular dilation.  Left Ventricular Ejection Fraction: 38 %  End diastolic volume 76 ml  End systolic volume 46 ml  IMPRESSION: 1. 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 No evidence reversible ischemia.  2. Normal left ventricular wall motion.  3. Left ventricular ejection fraction 38%  4. Non invasive risk stratification*: High ( primarily based on low ejection fraction).  *2012 Appropriate Use Criteria for Coronary Revascularization Focused Update: J Am Coll Cardiol. 2012;59(9):857-881. http://content.dementiazones.com.aspx?articleid=1201161   Electronically Signed By: Deboraha Fallow M.D. On: 03/06/2024 16:04   ECHOCARDIOGRAM  ECHOCARDIOGRAM COMPLETE 03/03/2024  Narrative ECHOCARDIOGRAM REPORT    Patient Name:   SHAWNTAY PREST Date of Exam: 03/03/2024 Medical Rec #:  [id number]        Height:       67.0 in Accession #:    [id number]       Weight:       234.1 lb Date of Birth:  25-Aug-1941         BSA:          2.162 m Patient Age:    82 years         BP:           129/58 mmHg Patient Gender: F                HR:           82 bpm. Exam Location:  Inpatient  Procedure: 2D Echo, Cardiac Doppler, Color Doppler and Intracardiac Opacification Agent (Both Spectral and Color Flow Doppler were utilized during  procedure).  Indications:    Elevated Troponin  History:        Patient has prior history of Echocardiogram examinations. Stroke, Signs/Symptoms:Shortness of Breath and Chest Pain; Risk Factors:Hypertension, Diabetes and Hyperlipidemia.  Sonographer:    Willey Harrier Referring Phys: [id number] JUSTIN B HOWERTER  IMPRESSIONS   1. Left ventricular ejection fraction, by estimation, is 50 to 55%. The left ventricle has low normal function. The left ventricle has no regional wall motion abnormalities. There is moderate concentric left ventricular hypertrophy. Left ventricular diastolic function could not be evaluated. There  is nonspecific abnormal septal motion. 2. Right ventricular systolic function is normal. The right ventricular size is normal. There is normal pulmonary artery systolic pressure. 3. The mitral valve is degenerative. Trivial mitral valve regurgitation. Mild mitral stenosis. Severe mitral annular calcification. 4. The aortic valve is calcified. There is moderate calcification of the aortic valve. There is moderate thickening of the aortic valve. Aortic valve regurgitation is not visualized. Moderate aortic valve stenosis. Aortic valve mean gradient measures 23.0 mmHg. 5. The inferior vena cava is normal in size with greater than 50% respiratory variability, suggesting right atrial pressure of 3 mmHg.  Comparison(s): Prior images reviewed side by side.  Conclusion(s)/Recommendation(s): LVEF overall unchanged from prior. No clear focal wall motion abnormalities with contrast, though there is some nonspecific septal motion. Moderate aortic stenosis, mild mitral stenosis.  FINDINGS Left Ventricle: Left ventricular ejection fraction, by estimation, is 50 to 55%. The left ventricle has low normal function. The left ventricle has no regional wall motion abnormalities. Definity  contrast agent was given IV to delineate the left ventricular endocardial borders. The left ventricular internal cavity size was normal in size. There is moderate concentric left ventricular hypertrophy. Nonspecific abnormal septal motion. Left ventricular diastolic function could not be evaluated due to mitral annular calcification (moderate or greater). Left ventricular diastolic function could not be evaluated.  Right Ventricle: The right ventricular size is normal. Right vetricular wall thickness was not well visualized. Right ventricular systolic function is normal. There is normal pulmonary artery systolic pressure. The tricuspid regurgitant velocity is 2.42 m/s, and with an assumed right atrial pressure of 3 mmHg, the estimated  right ventricular systolic pressure is 26.4 mmHg.  Left Atrium: Left atrial size was normal in size.  Right Atrium: Right atrial size was normal in size.  Pericardium: There is no evidence of pericardial effusion. Presence of epicardial fat layer.  Mitral Valve: MVA 2.5 by PHT. The mitral valve is degenerative in appearance. Severe mitral annular calcification. Trivial mitral valve regurgitation. Mild mitral valve stenosis. MV peak gradient, 13.5 mmHg. The mean mitral valve gradient is 6.0 mmHg.  Tricuspid Valve: The tricuspid valve is normal in structure. Tricuspid valve regurgitation is trivial. No evidence of tricuspid stenosis.  Aortic Valve: The aortic valve is calcified. There is moderate calcification of the aortic valve. There is moderate thickening of the aortic valve. Aortic valve regurgitation is not visualized. Moderate aortic stenosis is present. Aortic valve mean gradient measures 23.0 mmHg. Aortic valve peak gradient measures 36.7 mmHg. Aortic valve area, by VTI measures 1.35 cm.  Pulmonic Valve: The pulmonic valve was not well visualized. Pulmonic valve regurgitation is mild. No evidence of pulmonic stenosis.  Aorta: The aortic root and ascending aorta are structurally normal, with no evidence of dilitation.  Venous: The inferior vena cava is normal in size with greater than 50% respiratory variability, suggesting right atrial pressure of 3 mmHg.  IAS/Shunts: The atrial septum is grossly normal.  LEFT VENTRICLE PLAX 2D LVIDd:         4.79 cm   Diastology LVIDs:         3.18 cm   LV e' medial:    4.79 cm/s LV PW:         1.70 cm   LV E/e' medial:  18.6 LV IVS:        1.80 cm   LV e' lateral:   7.07 cm/s LVOT diam:     2.20 cm   LV E/e' lateral: 12.6 LV SV:         82 LV SV Index:   38 LVOT Area:     3.80 cm   RIGHT VENTRICLE             IVC RV Basal diam:  3.20 cm     IVC diam: 2.00 cm RV S prime:     13.80 cm/s TAPSE (M-mode): 1.8 cm  LEFT ATRIUM            Index        RIGHT ATRIUM           Index LA Vol (A2C): 44.9 ml 20.76 ml/m  RA Area:     14.60 cm LA Vol (A4C): 52.5 ml 24.28 ml/m  RA Volume:   35.60 ml  16.46 ml/m AORTIC VALVE AV Area (Vmax):    1.41 cm AV Area (Vmean):   1.45 cm AV Area (VTI):     1.35 cm AV Vmax:           303.00 cm/s AV Vmean:          227.000 cm/s AV VTI:            0.613 m AV Peak Grad:      36.7 mmHg AV Mean Grad:      23.0 mmHg LVOT Vmax:         112.00 cm/s LVOT Vmean:        86.300 cm/s LVOT VTI:          0.217 m LVOT/AV VTI ratio: 0.35  AORTA Ao Root diam: 3.30 cm Ao Asc diam:  3.30 cm  MITRAL VALVE                TRICUSPID VALVE MV Area (PHT): 4.96 cm     TR Peak grad:   23.4 mmHg MV Area VTI:   2.24 cm     TR Vmax:        242.00 cm/s MV Peak grad:  13.5 mmHg MV Mean grad:  6.0 mmHg     SHUNTS MV Vmax:       1.84 m/s     Systemic VTI:  0.22 m MV Vmean:      116.0 cm/s   Systemic Diam: 2.20 cm MV Decel Time: 153 msec MV E velocity: 89.10 cm/s MV A velocity: 145.00 cm/s MV E/A ratio:  0.61  Sheryle Donning MD Electronically signed by Sheryle Donning MD Signature Date/Time: 03/03/2024/3:09:57 PM    Final    MONITORS  LONG TERM MONITOR (3-14 DAYS) 03/21/2024  Narrative Patch Wear Time:  3 days and 22 hours (2025-05-14T16:44:54-0400 to 2025-05-18T15:33:28-0400)  Impression:  Sinus rhythm:   Rates 45 to 121 bpm   Average HR 70 bpm  Rare PACs   Occasional PVCs (7.1% total)  No sigifcant arrhythmia  Triggered event correlated with SR with PVC   CT SCANS  CT CORONARY MORPH W/CTA COR W/SCORE 03/28/2024  Addendum 04/02/2024  9:57 PM ADDENDUM  REPORT: 04/02/2024 21:54  EXAM: OVER-READ INTERPRETATION  CT CHEST  The following report is an over-read performed by radiologist Dr. Cyndia Drape [facility] on 04/02/2024. This over-read does not include interpretation of cardiac or coronary anatomy or pathology. The coronary CTA interpretation by  the cardiologist is attached.  COMPARISON:  04/08/2005.  FINDINGS: Cardiovascular:  See findings discussed in the body of the report.  Mediastinum/Nodes: No suspicious adenopathy identified. Imaged mediastinal structures are unremarkable.  Lungs/Pleura: There is dependent basilar subsegmental atelectasis. No pneumonia or pulmonary edema. No pleural effusion or pneumothorax.  Upper Abdomen: Numerous calcified gallstones.  Musculoskeletal: No chest wall abnormality. No acute osseous findings. Thoracic degenerative changes.  IMPRESSION: Cholelithiasis.   Electronically Signed By: Sydell Eva M.D. On: 04/02/2024 21:54  Narrative CLINICAL DATA:  35F with abnormal myoview   EXAM: Cardiac/Coronary CTA  TECHNIQUE: A non-contrast, gated CT scan was obtained with axial slices of 2.5 mm through the heart for calcium  scoring. Calcium  scoring was performed using the Agatston method. A 120 kV prospective, gated, contrast cardiac CT scan was obtained. Gantry rotation speed was 230 msec and collimation was 0.63 mm. Two sublingual nitroglycerin  tablets (0.8 mg) were given. The 3D data set was reconstructed with motion correction for the best systolic or diastolic phase. Images were analyzed on a dedicated workstation using MPR, MIP, and VRT modes. The patient received 95 cc of contrast.  FINDINGS: Image quality: Good  Noise artifact is: Limited.  Coronary Arteries:  Normal coronary origin.  Right dominance.  Left main: The left main is a large caliber vessel with a normal take off from the left coronary cusp that bifurcates to form a left anterior descending artery and a left circumflex artery. Mixed plaque in left main causes ~50% stenosis  Left anterior descending artery: The LAD is patent. The LAD gives off 3 patent diagonal branches. Calcified plaque in proximal LAD causes 25-49% stenosis. Calcified plaque in mid LAD causes 25-49% stenosis. Calcified plaque in D2  causes 25-49% stenosis. Calcified plaque in D3 causes 25-49% stenosis  Ramus intermedius: Patent with no evidence of plaque or stenosis.  Left circumflex artery: The LCX is non-dominant and patent. The LCX gives off 1 patent obtuse marginal branch. Calcified plaque in proximal LCX causes 25-49% stenosis  Right coronary artery: The RCA is dominant with normal take off from the right coronary cusp. The RCA terminates as a PDA and right posterolateral branch. Mixed plaque in proximal RCA causes 50-69% stenosis. Mixed plaque in mid RCA causes 50-69% stenosis. Calcified plaque in distal RCA causes 25-49% stenosis  Right Atrium: Right atrial size is within normal limits.  Right Ventricle: The right ventricular cavity is within normal limits.  Left Atrium: Left atrial size is normal in size with no left atrial appendage filling defect.  Left Ventricle: The ventricular cavity size is within normal limits.  Pulmonary arteries: Normal in size.  Pulmonary veins: Normal pulmonary venous drainage.  Pericardium: Normal thickness without significant effusion or calcium  present.  Cardiac valves: The aortic valve is trileaflet with severe calcifications (AV calcium  score 1619). Severe mitral annular calcifications  Aorta: Normal caliber  Extra-cardiac findings: See attached radiology report for non-cardiac structures.  IMPRESSION: 1. Coronary calcium  score of 1965. This was 98th percentile for age-, sex, and race-matched controls.  2. Total plaque volume 1829mm3 which is 96th percentile for age- and sex-matched controls (calcified plaque 524mm3; non-calcified plaque 1249mm3). TPV is extensive  3.  Normal coronary origin with right dominance.  4.  Normal coronary  arteries.  5.  Mixed plaque in left main causes ~50% stenosis  6.  Moderate (50-69%) stenosis in proximal and mid RCA  7. Mild (25-49%) stenosis in proximal/mid/distal LAD, proximal LCX, distal RCA, D2, and D3  8.   Severe aortic valve calcifications (AV calcium  score 1619)  9.  Severe mitral annular calcifications  8.  Will send study for CTFFR  RECOMMENDATIONS: CAD-RADS 4: Severe stenosis. (70-99% or > 50% left main). Cardiac catheterization or CT FFR is recommended. Consider symptom-guided anti-ischemic pharmacotherapy as well as risk factor modification per guideline directed care. Invasive coronary angiography recommended with revascularization per published guideline statements.  Electronically Signed: By: Carson Clara M.D. On: 03/31/2024 11:10     ______________________________________________________________________________________________      Risk Assessment/Calculations           Physical Exam VS:  BP 112/60   Pulse 69   Ht 5' 6 (1.676 m)   Wt 227 lb 12.8 oz (103.3 kg)   SpO2 93%   BMI 36.77 kg/m    Wt Readings from Last 3 Encounters:  04/09/24 227 lb 12.8 oz (103.3 kg)  03/19/24 231 lb (104.8 kg)  03/17/24 228 lb (103.4 kg)    GEN: Well nourished, well developed in no acute distress NECK: No JVD; No carotid bruits CARDIAC: RRR, no murmurs, rubs, gallops RESPIRATORY:  Clear to auscultation without rales, wheezing or rhonchi  ABDOMEN: Soft, non-tender, non-distended EXTREMITIES:  No edema; No deformity   ASSESSMENT AND PLAN  Abnormal coronary CT: Recent coronary CT showed hemodynamically significant lesion in left main and mid RCA, borderline hemodynamically significant lesion in mid LAD.  Will proceed with cardiac catheterization for definitive evaluation.  EKG showed diffused T wave abnormality inferior lateral leads.  Frequent PVCs: Recent heart monitor showed PVC burden 7.1%  Chest discomfort: Occurs more at rest rather than with exertion, suspect she also has acid reflux  Hypertension: Blood pressure stable  Hyperlipidemia: Blood pressure well-controlled  DM2: Previously on Jardiance , patient has not been taking her Jardiance .  Her blood glucose  has been well-controlled even without medication recently.  History of CVA: Also has prior history of intracranial microhemorrhages related to uncontrolled high blood pressure.    Informed Consent   Shared Decision Making/Informed Consent The risks [stroke (1 in 1000), death (1 in 1000), kidney failure [usually temporary] (1 in 500), bleeding (1 in 200), allergic reaction [possibly serious] (1 in 200)], benefits (diagnostic support and management of coronary artery disease) and alternatives of a cardiac catheterization were discussed in detail with [name] and she is willing to proceed.     Dispo: Follow-up in 3 weeks  Signed, Suhaila Troiano, PA

## 2024-04-09 NOTE — Progress Notes (Signed)
 Cardiology Office Note   Date:  04/09/2024  ID:  [name] 1941-05-22, MRN [id number] PCP: Cheryl Kast, FNP  [name] Cardiologist:  None     History of Present Illness [name] is a 83 y.o. female with past medical history of hypertension, hyperlipidemia, DM2, history of CVA, intracranial hemorrhage 12/23, carotid artery disease s/p recent left TCAR, LBBB and coronary artery calcification.  Myoview  in October 2018 showed EF 50%, low risk study with normal perfusion.  Echocardiogram in June 2023 and also in April 2024 showed EF 50 to 55%. 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 Neurology was consulted at the time and believe the finding was due to poorly controlled high blood pressure.  Patient was previously seen by [name] in November 2022 and established with Dr. Terri [name] of [facility].  Recently, patient went to the [facility] ED due to increased urinary frequency.  She had a low-grade fever of 100.2.  Lactic acid was normal.  Serial troponin however was elevated, therefore cardiology service was consulted.  She tested negative for COVID, RSV and influenza.  Urinalysis was also negative.  She received IV Rocephin  due to fever of unknown etiology.  On telemetry, she had fairly frequent PVCs, therefore we were unable to obtain coronary CT.  Dr. Avanell Bob recommended outpatient coronary CTA and the cardiology service will sign off.  Later during the admission, she was found to have greater than 70% left carotid artery stenosis and was seen by vascular surgery.  Cardiology service consulted for preop clearance and she underwent nuclear stress test that showed EF 38%, small to mid sized fixed defect in the  distal anteroseptal wall, otherwise no ischemia.  Patient eventually underwent left carotid stenting by [name] on 03/07/2024.  She was discharged with a home monitor for frequent PVCs.  Her monitor showed sinus rhythm with PVC burden of 7.1%, no significant arrhythmia.  She eventually underwent outpatient coronary CTA on 03/28/2024 which showed 50 to 69% proximal RCA lesion, 50 to 69% mid RCA lesion, 25 to 49% distal RCA lesion, 50% left main lesion 25 to 49% proximal LAD lesion, 25 to 49% mid LAD lesion, 25 to 49% D3 lesion, 25 to 29% proximal left circumflex lesion.  Subsequent FFR showed 0.74 across the proximal left main lesion suggesting the lesion was hemodynamically significant, FFR falls from 0.74-0.69 in proximal LAD, CT FFR further falls to 0.56 across the lesion in mid LAD, he had FFR of 0.92 in proximal RCA which falls to 0.74 across the mid RCA lesion suggesting it is hemodynamically significant.  Due to hemodynamically significant lesion left main in the mid RCA, and borderline significant FFR in mid LAD, cardiac catheterization was recommended.  Patient presents today accompanied by daughter and husband.  We reviewed coronary CT result.  Although she occasionally has some burning sensation across her chest, this typically occurs at rest and goes away with exertion.  I suspect she has some acid reflux.  Her EKG shows T wave inversion in the inferolateral leads, left bundle branch block.  I reviewed her case with DOD [name].  We recommend she proceed with cardiac catheterization for definitive evaluation.  Risk and benefit of the cardiac catheterization has been discussed with the patient who is willing to proceed.  She  is already on aspirin  and Plavix .  She has not been taking her Jardiance  and plans to discuss with her PCP regarding discontinuation of Jardiance .  ROS:   She denies palpitations, dyspnea, pnd, orthopnea, n, v, dizziness, syncope, edema, weight gain, or early satiety. All  other systems reviewed and are otherwise negative except as noted above.   She describe occasional burning sensation in the chest.  Symptom happens at rest, does not occur with exertion.  Studies Reviewed EKG Interpretation Date/Time:  Wednesday April 09 2024 12:04:10 EDT Ventricular Rate:  69 PR Interval:  152 QRS Duration:  156 QT Interval:  438 QTC Calculation: 469 R Axis:   54  Text Interpretation: Sinus rhythm with occasional Premature ventricular complexes Left bundle branch block When compared with ECG of 04-Mar-2024 15:33, No significant change was found Confirmed by [name] [id number]) on 04/09/2024 12:52:59 PM    Cardiac Studies & Procedures   ______________________________________________________________________________________________   STRESS TESTS  NM MYOCAR MULTI W/SPECT W 03/06/2024  Narrative CLINICAL DATA:  83 year old female with chest pain. Abnormal EKG. Intermediate risk for coronary disease.  EXAM: MYOCARDIAL IMAGING WITH SPECT (REST AND PHARMACOLOGIC-STRESS)  GATED LEFT VENTRICULAR WALL MOTION STUDY  LEFT VENTRICULAR EJECTION FRACTION  TECHNIQUE: Standard myocardial SPECT imaging was performed after resting intravenous injection of 10 mCi Tc-98m tetrofosmin . Subsequently, intravenous infusion of Lexiscan  was performed under the supervision of the Cardiology staff. At peak effect of the drug, 30 mCi Tc-25m tetrofosmin  was injected intravenously and standard myocardial SPECT imaging was performed. Quantitative gated imaging was also performed to evaluate left ventricular wall motion, and estimate left ventricular ejection fraction.  COMPARISON:  None Available.  FINDINGS: Perfusion: Medium size region moderate decreased counts in the apical and mid segment of the anterior septal wall which are fixed on rest and stress. No evidence reversible ischemia. No ventricular dilatation.  Wall Motion: Normal left ventricular wall motion. No  left ventricular dilation.  Left Ventricular Ejection Fraction: 38 %  End diastolic volume 76 ml  End systolic volume 46 ml  IMPRESSION: 1. 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 No evidence reversible ischemia.  2. Normal left ventricular wall motion.  3. Left ventricular ejection fraction 38%  4. Non invasive risk stratification*: High ( primarily based on low ejection fraction).  *2012 Appropriate Use Criteria for Coronary Revascularization Focused Update: J Am Coll Cardiol. 2012;59(9):857-881. http://content.dementiazones.com.aspx?articleid=1201161   Electronically Signed By: Deboraha Fallow M.D. On: 03/06/2024 16:04   ECHOCARDIOGRAM  ECHOCARDIOGRAM COMPLETE 03/03/2024  Narrative ECHOCARDIOGRAM REPORT    Patient Name:   [name] Date of Exam: 03/03/2024 Medical Rec #:  [id number]        Height:       67.0 in Accession #:    [id number]       Weight:       234.1 lb Date of Birth:  25-Aug-1941         BSA:          2.162 m Patient Age:    82 years         BP:           129/58 mmHg Patient Gender: F                HR:           82 bpm. Exam Location:  Inpatient  Procedure: 2D Echo, Cardiac Doppler, Color Doppler and Intracardiac Opacification Agent (Both Spectral and Color Flow Doppler were utilized during  procedure).  Indications:    Elevated Troponin  History:        Patient has prior history of Echocardiogram examinations. Stroke, Signs/Symptoms:Shortness of Breath and Chest Pain; Risk Factors:Hypertension, Diabetes and Hyperlipidemia.  Sonographer:    Willey Harrier Referring Phys: [id number] JUSTIN B HOWERTER  IMPRESSIONS   1. Left ventricular ejection fraction, by estimation, is 50 to 55%. The left ventricle has low normal function. The left ventricle has no regional wall motion abnormalities. There is moderate concentric left ventricular hypertrophy. Left ventricular diastolic function could not be evaluated. There  is nonspecific abnormal septal motion. 2. Right ventricular systolic function is normal. The right ventricular size is normal. There is normal pulmonary artery systolic pressure. 3. The mitral valve is degenerative. Trivial mitral valve regurgitation. Mild mitral stenosis. Severe mitral annular calcification. 4. The aortic valve is calcified. There is moderate calcification of the aortic valve. There is moderate thickening of the aortic valve. Aortic valve regurgitation is not visualized. Moderate aortic valve stenosis. Aortic valve mean gradient measures 23.0 mmHg. 5. The inferior vena cava is normal in size with greater than 50% respiratory variability, suggesting right atrial pressure of 3 mmHg.  Comparison(s): Prior images reviewed side by side.  Conclusion(s)/Recommendation(s): LVEF overall unchanged from prior. No clear focal wall motion abnormalities with contrast, though there is some nonspecific septal motion. Moderate aortic stenosis, mild mitral stenosis.  FINDINGS Left Ventricle: Left ventricular ejection fraction, by estimation, is 50 to 55%. The left ventricle has low normal function. The left ventricle has no regional wall motion abnormalities. Definity  contrast agent was given IV to delineate the left ventricular endocardial borders. The left ventricular internal cavity size was normal in size. There is moderate concentric left ventricular hypertrophy. Nonspecific abnormal septal motion. Left ventricular diastolic function could not be evaluated due to mitral annular calcification (moderate or greater). Left ventricular diastolic function could not be evaluated.  Right Ventricle: The right ventricular size is normal. Right vetricular wall thickness was not well visualized. Right ventricular systolic function is normal. There is normal pulmonary artery systolic pressure. The tricuspid regurgitant velocity is 2.42 m/s, and with an assumed right atrial pressure of 3 mmHg, the estimated  right ventricular systolic pressure is 26.4 mmHg.  Left Atrium: Left atrial size was normal in size.  Right Atrium: Right atrial size was normal in size.  Pericardium: There is no evidence of pericardial effusion. Presence of epicardial fat layer.  Mitral Valve: MVA 2.5 by PHT. The mitral valve is degenerative in appearance. Severe mitral annular calcification. Trivial mitral valve regurgitation. Mild mitral valve stenosis. MV peak gradient, 13.5 mmHg. The mean mitral valve gradient is 6.0 mmHg.  Tricuspid Valve: The tricuspid valve is normal in structure. Tricuspid valve regurgitation is trivial. No evidence of tricuspid stenosis.  Aortic Valve: The aortic valve is calcified. There is moderate calcification of the aortic valve. There is moderate thickening of the aortic valve. Aortic valve regurgitation is not visualized. Moderate aortic stenosis is present. Aortic valve mean gradient measures 23.0 mmHg. Aortic valve peak gradient measures 36.7 mmHg. Aortic valve area, by VTI measures 1.35 cm.  Pulmonic Valve: The pulmonic valve was not well visualized. Pulmonic valve regurgitation is mild. No evidence of pulmonic stenosis.  Aorta: The aortic root and ascending aorta are structurally normal, with no evidence of dilitation.  Venous: The inferior vena cava is normal in size with greater than 50% respiratory variability, suggesting right atrial pressure of 3 mmHg.  IAS/Shunts: The atrial septum is grossly normal.  LEFT VENTRICLE PLAX 2D LVIDd:         4.79 cm   Diastology LVIDs:         3.18 cm   LV e' medial:    4.79 cm/s LV PW:         1.70 cm   LV E/e' medial:  18.6 LV IVS:        1.80 cm   LV e' lateral:   7.07 cm/s LVOT diam:     2.20 cm   LV E/e' lateral: 12.6 LV SV:         82 LV SV Index:   38 LVOT Area:     3.80 cm   RIGHT VENTRICLE             IVC RV Basal diam:  3.20 cm     IVC diam: 2.00 cm RV S prime:     13.80 cm/s TAPSE (M-mode): 1.8 cm  LEFT ATRIUM            Index        RIGHT ATRIUM           Index LA Vol (A2C): 44.9 ml 20.76 ml/m  RA Area:     14.60 cm LA Vol (A4C): 52.5 ml 24.28 ml/m  RA Volume:   35.60 ml  16.46 ml/m AORTIC VALVE AV Area (Vmax):    1.41 cm AV Area (Vmean):   1.45 cm AV Area (VTI):     1.35 cm AV Vmax:           303.00 cm/s AV Vmean:          227.000 cm/s AV VTI:            0.613 m AV Peak Grad:      36.7 mmHg AV Mean Grad:      23.0 mmHg LVOT Vmax:         112.00 cm/s LVOT Vmean:        86.300 cm/s LVOT VTI:          0.217 m LVOT/AV VTI ratio: 0.35  AORTA Ao Root diam: 3.30 cm Ao Asc diam:  3.30 cm  MITRAL VALVE                TRICUSPID VALVE MV Area (PHT): 4.96 cm     TR Peak grad:   23.4 mmHg MV Area VTI:   2.24 cm     TR Vmax:        242.00 cm/s MV Peak grad:  13.5 mmHg MV Mean grad:  6.0 mmHg     SHUNTS MV Vmax:       1.84 m/s     Systemic VTI:  0.22 m MV Vmean:      116.0 cm/s   Systemic Diam: 2.20 cm MV Decel Time: 153 msec MV E velocity: 89.10 cm/s MV A velocity: 145.00 cm/s MV E/A ratio:  0.61  Sheryle Donning MD Electronically signed by Sheryle Donning MD Signature Date/Time: 03/03/2024/3:09:57 PM    Final    MONITORS  LONG TERM MONITOR (3-14 DAYS) 03/21/2024  Narrative Patch Wear Time:  3 days and 22 hours (2025-05-14T16:44:54-0400 to 2025-05-18T15:33:28-0400)  Impression:  Sinus rhythm:   Rates 45 to 121 bpm   Average HR 70 bpm  Rare PACs   Occasional PVCs (7.1% total)  No sigifcant arrhythmia  Triggered event correlated with SR with PVC   CT SCANS  CT CORONARY MORPH W/CTA COR W/SCORE 03/28/2024  Addendum 04/02/2024  9:57 PM ADDENDUM  REPORT: 04/02/2024 21:54  EXAM: OVER-READ INTERPRETATION  CT CHEST  The following report is an over-read performed by radiologist Dr. Cyndia Drape [facility] on 04/02/2024. This over-read does not include interpretation of cardiac or coronary anatomy or pathology. The coronary CTA interpretation by  the cardiologist is attached.  COMPARISON:  04/08/2005.  FINDINGS: Cardiovascular:  See findings discussed in the body of the report.  Mediastinum/Nodes: No suspicious adenopathy identified. Imaged mediastinal structures are unremarkable.  Lungs/Pleura: There is dependent basilar subsegmental atelectasis. No pneumonia or pulmonary edema. No pleural effusion or pneumothorax.  Upper Abdomen: Numerous calcified gallstones.  Musculoskeletal: No chest wall abnormality. No acute osseous findings. Thoracic degenerative changes.  IMPRESSION: Cholelithiasis.   Electronically Signed By: Sydell Eva M.D. On: 04/02/2024 21:54  Narrative CLINICAL DATA:  35F with abnormal myoview   EXAM: Cardiac/Coronary CTA  TECHNIQUE: A non-contrast, gated CT scan was obtained with axial slices of 2.5 mm through the heart for calcium  scoring. Calcium  scoring was performed using the Agatston method. A 120 kV prospective, gated, contrast cardiac CT scan was obtained. Gantry rotation speed was 230 msec and collimation was 0.63 mm. Two sublingual nitroglycerin  tablets (0.8 mg) were given. The 3D data set was reconstructed with motion correction for the best systolic or diastolic phase. Images were analyzed on a dedicated workstation using MPR, MIP, and VRT modes. The patient received 95 cc of contrast.  FINDINGS: Image quality: Good  Noise artifact is: Limited.  Coronary Arteries:  Normal coronary origin.  Right dominance.  Left main: The left main is a large caliber vessel with a normal take off from the left coronary cusp that bifurcates to form a left anterior descending artery and a left circumflex artery. Mixed plaque in left main causes ~50% stenosis  Left anterior descending artery: The LAD is patent. The LAD gives off 3 patent diagonal branches. Calcified plaque in proximal LAD causes 25-49% stenosis. Calcified plaque in mid LAD causes 25-49% stenosis. Calcified plaque in D2  causes 25-49% stenosis. Calcified plaque in D3 causes 25-49% stenosis  Ramus intermedius: Patent with no evidence of plaque or stenosis.  Left circumflex artery: The LCX is non-dominant and patent. The LCX gives off 1 patent obtuse marginal branch. Calcified plaque in proximal LCX causes 25-49% stenosis  Right coronary artery: The RCA is dominant with normal take off from the right coronary cusp. The RCA terminates as a PDA and right posterolateral branch. Mixed plaque in proximal RCA causes 50-69% stenosis. Mixed plaque in mid RCA causes 50-69% stenosis. Calcified plaque in distal RCA causes 25-49% stenosis  Right Atrium: Right atrial size is within normal limits.  Right Ventricle: The right ventricular cavity is within normal limits.  Left Atrium: Left atrial size is normal in size with no left atrial appendage filling defect.  Left Ventricle: The ventricular cavity size is within normal limits.  Pulmonary arteries: Normal in size.  Pulmonary veins: Normal pulmonary venous drainage.  Pericardium: Normal thickness without significant effusion or calcium  present.  Cardiac valves: The aortic valve is trileaflet with severe calcifications (AV calcium  score 1619). Severe mitral annular calcifications  Aorta: Normal caliber  Extra-cardiac findings: See attached radiology report for non-cardiac structures.  IMPRESSION: 1. Coronary calcium  score of 1965. This was 98th percentile for age-, sex, and race-matched controls.  2. Total plaque volume 1829mm3 which is 96th percentile for age- and sex-matched controls (calcified plaque 524mm3; non-calcified plaque 1249mm3). TPV is extensive  3.  Normal coronary origin with right dominance.  4.  Normal coronary  arteries.  5.  Mixed plaque in left main causes ~50% stenosis  6.  Moderate (50-69%) stenosis in proximal and mid RCA  7. Mild (25-49%) stenosis in proximal/mid/distal LAD, proximal LCX, distal RCA, D2, and D3  8.   Severe aortic valve calcifications (AV calcium  score 1619)  9.  Severe mitral annular calcifications  8.  Will send study for CTFFR  RECOMMENDATIONS: CAD-RADS 4: Severe stenosis. (70-99% or > 50% left main). Cardiac catheterization or CT FFR is recommended. Consider symptom-guided anti-ischemic pharmacotherapy as well as risk factor modification per guideline directed care. Invasive coronary angiography recommended with revascularization per published guideline statements.  Electronically Signed: By: Carson Clara M.D. On: 03/31/2024 11:10     ______________________________________________________________________________________________      Risk Assessment/Calculations           Physical Exam VS:  BP 112/60   Pulse 69   Ht 5' 6 (1.676 m)   Wt 227 lb 12.8 oz (103.3 kg)   SpO2 93%   BMI 36.77 kg/m    Wt Readings from Last 3 Encounters:  04/09/24 227 lb 12.8 oz (103.3 kg)  03/19/24 231 lb (104.8 kg)  03/17/24 228 lb (103.4 kg)    GEN: Well nourished, well developed in no acute distress NECK: No JVD; No carotid bruits CARDIAC: RRR, no murmurs, rubs, gallops RESPIRATORY:  Clear to auscultation without rales, wheezing or rhonchi  ABDOMEN: Soft, non-tender, non-distended EXTREMITIES:  No edema; No deformity   ASSESSMENT AND PLAN  Abnormal coronary CT: Recent coronary CT showed hemodynamically significant lesion in left main and mid RCA, borderline hemodynamically significant lesion in mid LAD.  Will proceed with cardiac catheterization for definitive evaluation.  EKG showed diffused T wave abnormality inferior lateral leads.  Frequent PVCs: Recent heart monitor showed PVC burden 7.1%  Chest discomfort: Occurs more at rest rather than with exertion, suspect she also has acid reflux  Hypertension: Blood pressure stable  Hyperlipidemia: Blood pressure well-controlled  DM2: Previously on Jardiance , patient has not been taking her Jardiance .  Her blood glucose  has been well-controlled even without medication recently.  History of CVA: Also has prior history of intracranial microhemorrhages related to uncontrolled high blood pressure.    Informed Consent   Shared Decision Making/Informed Consent The risks [stroke (1 in 1000), death (1 in 1000), kidney failure [usually temporary] (1 in 500), bleeding (1 in 200), allergic reaction [possibly serious] (1 in 200)], benefits (diagnostic support and management of coronary artery disease) and alternatives of a cardiac catheterization were discussed in detail with Ms. Traore and she is willing to proceed.     Dispo: Follow-up in 3 weeks  Signed, Suhaila Troiano, PA

## 2024-04-09 NOTE — Patient Instructions (Signed)
 Medication Instructions:  NO CHANGES *If you need a refill on your cardiac medications before your next appointment, please call your pharmacy*  Lab Work: CBC AND BMET TODAY If you have labs (blood work) drawn today and your tests are completely normal, you will receive your results only by: MyChart Message (if you have MyChart) OR A paper copy in the mail If you have any lab test that is abnormal or we need to change your treatment, we will call you to review the results.  Testing/Procedures:       Cardiac/Peripheral Catheterization   You are scheduled for a Cardiac Catheterization on Tuesday, June 17 with Dr. Randene Bustard.  1. Please arrive at the [facility] (Main Entrance A) at [facility]: [address] [place], [place] [address] at 8:00 AM (This time is 2 hour(s) before your procedure to ensure your preparation).   Free valet parking service is available. You will check in at ADMITTING. The support person will be asked to wait in the waiting room.  It is OK to have someone drop you off and come back when you are ready to be discharged.        Special note: Every effort is made to have your procedure done on time. Please understand that emergencies sometimes delay scheduled procedures.  2. Diet: Do not eat solid foods after midnight.  You may have clear liquids until 5 AM the day of the procedure.  3. Labs: You will have blood drawn on today  4. Medication instructions in preparation for your procedure:   Contrast Allergy: No  On the morning of your procedure, take Aspirin  81 mg and any morning medicines NOT listed above.  You may use sips of water.  5. Plan to go home the same day, you will only stay overnight if medically necessary. 6. You MUST have a responsible adult to drive you home. 7. An adult MUST be with you the first 24 hours after you arrive home. 8. Bring a current list of your medications, and the last time and date medication taken. 9. Bring ID  and current insurance cards. 10.Please wear clothes that are easy to get on and off and wear slip-on shoes.  Thank you for allowing us  to care for you!   -- [facility]   Follow-Up: At [facility], you and your health needs are our priority.  As part of our continuing mission to provide you with exceptional heart care, our providers are all part of one team.  This team includes your primary Cardiologist (physician) and Advanced Practice Providers or APPs (Physician Assistants and Nurse Practitioners) who all work together to provide you with the care you need, when you need it.  Your next appointment:   3 week(s)  Provider:   [name], PA

## 2024-04-10 ENCOUNTER — Telehealth: Payer: Self-pay | Admitting: *Deleted

## 2024-04-10 ENCOUNTER — Ambulatory Visit: Payer: Self-pay | Admitting: Physician Assistant

## 2024-04-10 LAB — BASIC METABOLIC PANEL WITH GFR
BUN/Creatinine Ratio: 16 (ref 12–28)
BUN: 22 mg/dL (ref 8–27)
CO2: 19 mmol/L — ABNORMAL LOW (ref 20–29)
Calcium: 9.3 mg/dL (ref 8.7–10.3)
Chloride: 102 mmol/L (ref 96–106)
Creatinine, Ser: 1.35 mg/dL — ABNORMAL HIGH (ref 0.57–1.00)
Glucose: 136 mg/dL — ABNORMAL HIGH (ref 70–99)
Potassium: 3.8 mmol/L (ref 3.5–5.2)
Sodium: 140 mmol/L (ref 134–144)
eGFR: 39 mL/min/{1.73_m2} — ABNORMAL LOW (ref >59)

## 2024-04-10 NOTE — Progress Notes (Signed)
 Stable renal function and electrolyte. Normal red blood cell count. Drink more fluid for 2 days prior to cath.

## 2024-04-10 NOTE — Telephone Encounter (Signed)
 CLINICAL USE BELOW THIS LINE (use X to signify taken)  ____Form received and placed in providers office for signature. __X__Form completed and faxed to [facility]. ____Form completed & LVM to notify pt ready for pick up __X__Charge sheet & copy of form in front office folder for office supervisor.

## 2024-04-10 NOTE — Telephone Encounter (Addendum)
 Cardiac Catheterization scheduled at [facility] for: Tuesday April 15, 2024 11:30 AM Arrival time [facility] Main Entrance A at: 6:30 AM-pre-procedure  Nothing to eat after midnight prior to procedure, clear liquids until 5 AM day of procedure.  Medication instructions: -Hold:  Amlopidine/Olmesartan /Hydrochlorothiazide -day before and day of procedure-eGFR <60 (39)-pt only takes prn-has not had to take recently -Other usual morning medications can be taken with sips of water including aspirin  81 mg and Plavix  75 mg.  Plan to go home the same day, you will only stay overnight if medically necessary.  You must have responsible adult to drive you home.  Someone must be with you the first 24 hours after you arrive home.  Reviewed procedure instructions/pre-procedure hydration with patient's daughter (DPR), [name].

## 2024-04-11 ENCOUNTER — Telehealth: Payer: Self-pay | Admitting: Internal Medicine

## 2024-04-11 NOTE — Telephone Encounter (Signed)
 Spoke with pt's daughter, [name] (ok per [facility]) regarding getting her hair washed today, with heart cath on Tuesday, 6/17. Advised daughter that there is no contraindication to her getting her hair washed today. No further questions at this time.

## 2024-04-11 NOTE — Telephone Encounter (Signed)
 Pt daughter asked if its okay for pt to get her hair washed today prior to cath.

## 2024-04-11 NOTE — Telephone Encounter (Signed)
Forms received and faxed.

## 2024-04-11 NOTE — Telephone Encounter (Addendum)
 Called patient regarding labs, patient verbalized understanding didnt have any questions  ----- Message from [name] sent at 04/10/2024  5:44 PM EDT ----- Stable renal function and electrolyte. Normal red blood cell count. Drink more fluid for 2 days prior to cath.  ----- Message ----- From: Interface, [facility] Lab Results In Sent: 04/09/2024  11:36 PM EDT To: [name], PA

## 2024-04-11 NOTE — Telephone Encounter (Signed)
 Patient dropped off document Home Health Certificate (Order ID [id number]), to be filled out by provider. Patient requested to send it back via Fax within 7-days. Document is located in providers tray at front office.Please advise at [phone]

## 2024-04-11 NOTE — Telephone Encounter (Signed)
Pt returning call in regards to results. Please advise 

## 2024-04-14 ENCOUNTER — Other Ambulatory Visit: Payer: Self-pay | Admitting: Physician Assistant

## 2024-04-14 ENCOUNTER — Encounter: Payer: Self-pay | Admitting: Podiatry

## 2024-04-14 NOTE — Progress Notes (Signed)
 ANNUAL DIABETIC FOOT EXAM  Subjective: [name] presents today for annual diabetic foot exam. She relates she was very ill and admitted to hospital for TIA and heart issues since our last encounter. States her daughters were here while she was recuperating. Patient unaware of bruising on right forefoot area today. Does not recall any trauma to area. States she was taking medication and urine may have hit her foot. Foot is not painful. Also has right 5th digit toenail missing.  Chief Complaint  Patient presents with   [facility]    Rm17/Blood sugar 130/A1C 6.1/last visit with pcp May 2025/plavix    Patient confirms h/o diabetes.  Patient denies any h/o foot wounds.  Patient has been diagnosed with neuropathy.  [name], FNP is patient's PCP.  Past Medical History:  Diagnosis Date   Abdominal bloating 02/17/2019   Abdominal pain    AKI (acute kidney injury) (HCC) 02/12/2023   Arthritis    cervical disc degeneration/ oa left knee, carpal tunnel rt wrist, adhesive capsulitis right shoulder, rt hand weakness; lumbar degeneration   Carpal tunnel syndrome    CARPAL TUNNEL SYNDROME, RIGHT 10/14/2010   Qualifier: Diagnosis of   By: Nasario Badder MD, Sallyanne Creamer        CHEST PAIN, ATYPICAL 07/01/2010   Qualifier: Diagnosis of   By: Rodrick Clapper MD, Jackqulyn Masse acquired pneumonia 05/06/2020   Complication of anesthesia 2006-at [facility]   breathing problems-no BP med given prior to surgery;  hx of being very sleepy after colon surgery --  states no problems with last right total knee replacement 2013   Constipation    Diabetes mellitus without complication (HCC)    borderline - diet control   Diverticulitis hx of   Diverticulosis    E. coli infection    2020   Frequent UTI    hx of urethral injury during colon surgery - states frequent uti's since   GERD (gastroesophageal reflux disease)    Gout, unspecified 09/09/2007   Qualifier: Diagnosis of   By: Larrie Po MD, Wilmon Hashimoto        H.  pylori infection 07/18/2010   H/O hiatal hernia    HIP PAIN, LEFT, CHRONIC 11/09/2010   Qualifier: Diagnosis of   By: Larrie Po MD, Wilmon Hashimoto        History of colon polyps 08/26/2008   History of palpitations    in the past   History of shingles    has a lingering itching on back where shingles were   Hyperlipidemia    Hypertension    Hypokalemia 10/19/2022   KNEE PAIN, RIGHT, CHRONIC 10/14/2010   Qualifier: Diagnosis of   By: Nasario Badder MD, Sallyanne Creamer        Lightheaded 02/17/2019   Morbidly obese (HCC) 05/31/2011   bmi of 45     Numbness and tingling in right hand    pt. 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   Shortness of breath 09/14/2021   Shortness of breath 09/14/2021   Spasm of lumbar paraspinous muscle 06/17/2011   Stroke (HCC)    Stroke-like symptom    Subacute  intracranial hemorrhage (HCC) 10/18/2022   Transient speech disturbance 04/18/2022   Urinary frequency 02/17/2019   Vaginal discharge 08/02/2011   Vitamin D  deficiency    Patient Active Problem List   Diagnosis Date Noted   PVC's (premature ventricular contractions) 03/06/2024   TIA (transient ischemic attack) 03/06/2024   Symptomatic stenosis of left carotid artery 03/06/2024   Acute cystitis 03/03/2024   Elevated troponin 03/03/2024   Type 2 diabetes mellitus with hyperglycemia (HCC) 03/03/2024   Mild protein malnutrition (HCC) 03/03/2024   Transaminitis 03/03/2024   Pseudohyponatremia 03/03/2024   Hyperbilirubinemia 03/03/2024   Class 2 obesity 03/03/2024   Hypoglycemia 06/23/2023   History of TIA (transient ischemic attack) 02/12/2023   OSA on CPAP 10/19/2022   Urge incontinence 08/23/2022   Chronic kidney  disease, stage 3a (HCC) 07/17/2021   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   Aortic atherosclerosis (HCC) 01/03/2021   Class 2 severe obesity due to excess calories with serious comorbidity and body mass index (BMI) of 39.0 to 39.9 in adult (HCC) 01/03/2021   Vitamin D  deficiency 06/01/2020   History of arthroplasty of right knee 05/11/2020   Constipation 06/26/2018   Type 2 diabetes mellitus with other specified complication (HCC) 06/19/2014   OA (osteoarthritis) of knee 01/05/2012   LBBB (left bundle branch block) 07/26/2010   Gastroesophageal reflux disease without esophagitis 10/06/2008   History of colonic polyps 08/26/2008   Hyperlipidemia associated with type 2 diabetes mellitus (HCC) 06/03/2007   Essential hypertension 06/03/2007   Past Surgical History:  Procedure Laterality Date   ABDOMINAL HYSTERECTOMY     bladder tack     BREAST BIOPSY Left    BREAST SURGERY     breast duct resection- benign   CARPAL TUNNEL RELEASE Right 06/04/2014   Procedure: RIGHT CARPAL TUNNEL RELEASE;  Surgeon: Ronn Cohn, MD;  Location: [facility];  Service: Orthopedics;  Laterality: Right;   COLON RESECTION  2008   hx diverticulosis   COLON SURGERY     COLONOSCOPY     colonoscopy [id number]     JOINT REPLACEMENT     OOPHORECTOMY     POLYPECTOMY     skin graft left arm - traumatic compression injury left upper arm     temporary ureter stent     TOTAL KNEE ARTHROPLASTY  01/05/2012   Procedure: TOTAL KNEE ARTHROPLASTY;  Surgeon: Aurther Blue, MD;  Location: [facility];  Service: Orthopedics;  Laterality: Right;   TOTAL KNEE ARTHROPLASTY Left 08/17/2014   Procedure: LEFT TOTAL KNEE ARTHROPLASTY;  Surgeon: Aurther Blue, MD;  Location: [facility];  Service: Orthopedics;  Laterality: Left;   TRANSCAROTID ARTERY REVASCULARIZATION  Left 03/07/2024   Procedure: TRANSCAROTID ARTERY REVASCULARIZATION (TCAR);  Surgeon: Margherita Shell, MD;  Location: [facility] OR;  Service: Vascular;   Laterality: Left;   ureter repair for tyransected left ureter     Current Outpatient Medications on File Prior to Visit  Medication Sig Dispense Refill   acetaminophen  (TYLENOL ) 325 MG tablet Take 2 tablets (650 mg total) by mouth every 6 (six) hours as needed for mild pain (pain score 1-3) (or Fever >/= 101).     aspirin  EC 81 MG tablet Take 1 tablet (81 mg total) by mouth daily. Swallow whole.     Blood Pressure KIT 1 Units by Does not apply route daily. Check blood pressure daily for hypertension dx code I10 1 kit 0   clopidogrel  (PLAVIX ) 75 MG tablet Take 1 tablet (75 mg total) by mouth daily. 90 tablet  1   Continuous Glucose Receiver (FREESTYLE LIBRE READER) DEVI Use as directed to check blood glucose level 1 each 0   Continuous Glucose Sensor (FREESTYLE LIBRE 3 PLUS SENSOR) MISC Change sensor every 15 days. 2 each 12   Lancets (ONETOUCH DELICA PLUS LANCET33G) MISC USE TO CHECK BLOOD SUGAR TWICE DAILY 100 each 3   Misc. Devices MISC Bedside Commode.M17.12, N39.41, G45.9 1 each 0   Misc. Devices Engineer, civil (consulting). M17.12, N39.41, G45.9 1 each 0   nitroGLYCERIN  (NITROSTAT ) 0.4 MG SL tablet Place 1 tablet (0.4 mg total) under the tongue every 5 (five) minutes as needed for chest pain. 25 tablet 3   Olmesartan -amLODIPine -HCTZ 40-10-12.5 MG TABS Take 1 tablet by mouth daily.     ONETOUCH VERIO test strip USE TO TEST 2 TIMES DAILY 100 strip 3   pantoprazole  (PROTONIX ) 40 MG tablet Take 40 mg by mouth daily.     rosuvastatin  (CRESTOR ) 40 MG tablet Take 1 tablet (40 mg total) by mouth daily. 90 tablet 1   solifenacin  (VESICARE ) 5 MG tablet Take 1 tablet (5 mg total) by mouth daily. 90 tablet 1   No current facility-administered medications on file prior to visit.    Allergies  Allergen Reactions   Bee Venom Hives   Latex Hives and Itching   Tape     Burns skin, tolerates paper tape   Codeine Other (See Comments)    Hallucinations    Hydrocodone Other (See Comments)    hallucinations     Oxycodone  Other (See Comments)    hallucinations   Social History   Occupational History   Occupation: Copywriter, advertising: [employer]  Tobacco Use   Smoking status: Never   Smokeless tobacco: Never  Vaping Use   Vaping status: Never Used  Substance and Sexual Activity   Alcohol use: No   Drug use: No   Sexual activity: Not Currently   Family History  Problem Relation Age of Onset   Breast cancer Mother 62   Hypertension Mother    Prostate cancer Father    Hypertension Father    Dementia Sister    [name] cancer Brother    Hypertension Brother    COPD Brother    Cancer Maternal Grandmother    Arthritis Sister    Hypertension Sister    Arthritis Sister    Hypertension Child    Hypertension Child    Hypertension Child    Colon polyps Neg Hx    Esophageal cancer Neg Hx    Rectal cancer Neg Hx    Stomach cancer Neg Hx    Colon cancer Neg Hx    Immunization History  Administered Date(s) Administered   Fluad Quad(high Dose 65+) 09/01/2020, 07/12/2021   Influenza Split 07/25/2012   Influenza, High Dose Seasonal PF 10/08/2018, 10/02/2019   Influenza,inj,Quad PF,6+ Mos 11/21/2013   Influenza-Unspecified 07/25/2012, 11/21/2013   Moderna Covid-19 Vaccine Bivalent Booster 27yrs & up 07/07/2021   Moderna SARS-COV2 Booster Vaccination 10/08/2020, 10/08/2020   Moderna Sars-Covid-2 Vaccination 12/03/2019, 01/01/2020   Pneumococcal Polysaccharide-23 11/21/2013   Pneumococcal-Unspecified 11/21/2013   Td 01/29/1999, 11/21/2013     Review of Systems: Negative except as noted in the HPI.   Objective: There were no vitals filed for this visit.  [name] is a pleasant 83 y.o. female in NAD. AAO X 3.  Diabetic foot exam was performed with the following findings:   Vascular Examination: CFT <3 seconds b/l. DP/PT pulses faintly palpable b/l. Skin temperature gradient  warm to warm b/l. No pain with calf compression. No ischemia or gangrene. No cyanosis or clubbing  noted b/l.    Neurological Examination: Sensation grossly intact b/l with 10 gram monofilament. Vibratory sensation intact b/l.   Dermatological Examination: Ecchymosis noted dorsal forefoot right foot. No ischemia noted. Anonychia right 5th digit. Nailbed intact.  Pedal skin warm and supple b/l.   No open wounds. No interdigital macerations.  Toenails 1-4 b/l and left 5th toe b/l thick, discolored, elongated with subungual debris and pain on dorsal palpation.    Musculoskeletal Examination: Patient is able to move all digits of right foot. No pain or limitations of right foot. Normal ROM. Adductovarus deformity 3-5 bilaterally.  Radiographs: None     Lab Results  Component Value Date   HGBA1C 7.7 (H) 03/05/2024    ADA Risk Categorization: Low Risk :  Patient has all of the following: Intact protective sensation No prior foot ulcer  No severe deformity Pedal pulses present  Assessment: 1. Pain due to onychomycosis of nail   2. Acquired adductovarus rotation of toe, unspecified laterality   3. Bruise   4. Injury of toenail of right foot, initial encounter   5. Type 2 diabetes mellitus with stage 3a chronic kidney disease, without long-term current use of insulin  (HCC)   6. Encounter for diabetic foot exam [facility])     Plan: Patient has bruise right foot which she nor husband can recall. Injury appears subacute and is not symptomatic. Will monitor for now. She will contact office if it becomes symptomatic. Right 5th toe injury subacute and requires no treatment on today. Patient advised toenail will take up to 3 months to grow out. Diabetic foot examination performed today. All patient's and/or POA's questions/concerns addressed on today's visit. Toenails 1-4 b/l and left 5th toe debrided in length and girth without incident. Continue foot and shoe inspections daily. Monitor blood glucose per PCP/Endocrinologist's recommendations. Continue soft, supportive shoe gear daily. Report  any pedal injuries to medical professional. Call office if there are any questions/concerns. -Patient/POA to call should there be question/concern in the interim. Return in about 3 months (around 07/10/2024).  [name], DPM      [place] LOCATION: [address], [place] [address]                   Office [phone]   [facility] LOCATION: [address] [place], [place] [address] Office [phone]

## 2024-04-15 ENCOUNTER — Encounter (HOSPITAL_COMMUNITY): Payer: Self-pay | Admitting: Cardiology

## 2024-04-15 ENCOUNTER — Other Ambulatory Visit: Payer: Self-pay

## 2024-04-15 ENCOUNTER — Ambulatory Visit (HOSPITAL_COMMUNITY)
Admission: RE | Admit: 2024-04-15 | Discharge: 2024-04-15 | Disposition: A | Discharge status: Home / Self Care | Attending: Cardiology | Admitting: Cardiology

## 2024-04-15 ENCOUNTER — Encounter (HOSPITAL_COMMUNITY)
Admission: RE | Disposition: A | Discharge status: Home / Self Care | Payer: Self-pay | Source: Home / Self Care | Attending: Cardiology

## 2024-04-15 DIAGNOSIS — E785 Hyperlipidemia, unspecified: Secondary | ICD-10-CM | POA: Insufficient documentation

## 2024-04-15 DIAGNOSIS — Z8673 Personal history of transient ischemic attack (TIA), and cerebral infarction without residual deficits: Secondary | ICD-10-CM | POA: Insufficient documentation

## 2024-04-15 DIAGNOSIS — I493 Ventricular premature depolarization: Secondary | ICD-10-CM | POA: Insufficient documentation

## 2024-04-15 DIAGNOSIS — E119 Type 2 diabetes mellitus without complications: Secondary | ICD-10-CM | POA: Insufficient documentation

## 2024-04-15 DIAGNOSIS — I2510 Atherosclerotic heart disease of native coronary artery without angina pectoris: Secondary | ICD-10-CM | POA: Diagnosis not present

## 2024-04-15 DIAGNOSIS — Z7982 Long term (current) use of aspirin: Secondary | ICD-10-CM | POA: Insufficient documentation

## 2024-04-15 DIAGNOSIS — R931 Abnormal findings on diagnostic imaging of heart and coronary circulation: Secondary | ICD-10-CM | POA: Diagnosis not present

## 2024-04-15 DIAGNOSIS — I119 Hypertensive heart disease without heart failure: Secondary | ICD-10-CM | POA: Diagnosis not present

## 2024-04-15 DIAGNOSIS — Z7902 Long term (current) use of antithrombotics/antiplatelets: Secondary | ICD-10-CM | POA: Diagnosis not present

## 2024-04-15 HISTORY — PX: LEFT HEART CATH AND CORONARY ANGIOGRAPHY: CATH118249

## 2024-04-15 LAB — GLUCOSE, CAPILLARY
Glucose-Capillary: 160 mg/dL — ABNORMAL HIGH (ref 70–99)
Glucose-Capillary: 126 mg/dL — ABNORMAL HIGH (ref 70–99)

## 2024-04-15 SURGERY — LEFT HEART CATH AND CORONARY ANGIOGRAPHY
Anesthesia: LOCAL

## 2024-04-15 MED ORDER — SODIUM CHLORIDE 0.9 % IV SOLN: INTRAVENOUS | Status: DC

## 2024-04-15 MED ORDER — LIDOCAINE HCL (PF) 1 % IJ SOLN
INTRAMUSCULAR | Status: AC
  Filled 2024-04-15: qty 30

## 2024-04-15 MED ORDER — HEPARIN (PORCINE) IN NACL 2-0.9 UNITS/ML
INTRAMUSCULAR | Status: DC | PRN
  Administered 2024-04-15: 10 mL via INTRA_ARTERIAL

## 2024-04-15 MED ORDER — SODIUM CHLORIDE 0.9% FLUSH: 3.0000 mL | INTRAVENOUS | Status: DC | PRN

## 2024-04-15 MED ORDER — IOHEXOL 350 MG/ML SOLN
INTRAVENOUS | Status: DC | PRN
  Administered 2024-04-15: 40 mL

## 2024-04-15 MED ORDER — FENTANYL CITRATE (PF) 100 MCG/2ML IJ SOLN
INTRAMUSCULAR | Status: DC | PRN
Start: 2024-04-15 — End: 2024-04-15
  Administered 2024-04-15: 25 ug via INTRAVENOUS

## 2024-04-15 MED ORDER — VERAPAMIL HCL 2.5 MG/ML IV SOLN
INTRAVENOUS | Status: AC
  Filled 2024-04-15: qty 2

## 2024-04-15 MED ORDER — HEPARIN SODIUM (PORCINE) 1000 UNIT/ML IJ SOLN
INTRAMUSCULAR | Status: DC | PRN
  Administered 2024-04-15: 5000 [IU] via INTRAVENOUS

## 2024-04-15 MED ORDER — SODIUM CHLORIDE 0.9 % WEIGHT BASED INFUSION
3.0000 mL/kg/h | INTRAVENOUS | Status: AC
  Administered 2024-04-15: 3 mL/kg/h via INTRAVENOUS

## 2024-04-15 MED ORDER — SODIUM CHLORIDE 0.9 % WEIGHT BASED INFUSION: 1.0000 mL/kg/h | INTRAVENOUS | Status: DC

## 2024-04-15 MED ORDER — LIDOCAINE HCL (PF) 1 % IJ SOLN
INTRAMUSCULAR | Status: DC | PRN
  Administered 2024-04-15: 2 mL

## 2024-04-15 MED ORDER — HYDRALAZINE HCL 20 MG/ML IJ SOLN: 10.0000 mg | INTRAMUSCULAR | Status: DC | PRN

## 2024-04-15 MED ORDER — MIDAZOLAM HCL 2 MG/2ML IJ SOLN
INTRAMUSCULAR | Status: AC
  Filled 2024-04-15: qty 2

## 2024-04-15 MED ORDER — ACETAMINOPHEN 325 MG PO TABS
650.0000 mg | ORAL_TABLET | ORAL | Status: DC | PRN
Start: 2024-04-15 — End: 2024-04-15

## 2024-04-15 MED ORDER — ONDANSETRON HCL 4 MG/2ML IJ SOLN: 4.0000 mg | Freq: Four times a day (QID) | INTRAMUSCULAR | Status: DC | PRN

## 2024-04-15 MED ORDER — HEPARIN SODIUM (PORCINE) 1000 UNIT/ML IJ SOLN
INTRAMUSCULAR | Status: AC
  Filled 2024-04-15: qty 10

## 2024-04-15 MED ORDER — FENTANYL CITRATE (PF) 100 MCG/2ML IJ SOLN
INTRAMUSCULAR | Status: AC
Start: 2024-04-15 — End: 2024-04-15
  Filled 2024-04-15: qty 2

## 2024-04-15 MED ORDER — LABETALOL HCL 5 MG/ML IV SOLN
10.0000 mg | INTRAVENOUS | Status: DC | PRN
  Administered 2024-04-15: 10 mg via INTRAVENOUS
  Filled 2024-04-15: qty 4

## 2024-04-15 MED ORDER — HEPARIN (PORCINE) IN NACL 1000-0.9 UT/500ML-% IV SOLN
INTRAVENOUS | Status: DC | PRN
  Administered 2024-04-15: 1000 mL

## 2024-04-15 MED ORDER — SODIUM CHLORIDE 0.9% FLUSH: 3.0000 mL | Freq: Two times a day (BID) | INTRAVENOUS | Status: DC

## 2024-04-15 MED ORDER — ASPIRIN 81 MG PO CHEW: 81.0000 mg | CHEWABLE_TABLET | ORAL | Status: DC

## 2024-04-15 MED ORDER — SODIUM CHLORIDE 0.9 % IV SOLN: 250.0000 mL | INTRAVENOUS | Status: DC | PRN

## 2024-04-15 MED ORDER — MIDAZOLAM HCL 2 MG/2ML IJ SOLN
INTRAMUSCULAR | Status: DC | PRN
  Administered 2024-04-15: 1 mg via INTRAVENOUS

## 2024-04-15 SURGICAL SUPPLY — 8 items
CATH INFINITI AMBI 5FR TG (CATHETERS) IMPLANT
CATH INFINITI JR4 5F (CATHETERS) IMPLANT
DEVICE RAD COMP TR BAND LRG (VASCULAR PRODUCTS) IMPLANT
GLIDESHEATH SLEND SS 6F .021 (SHEATH) IMPLANT
GUIDEWIRE INQWIRE 1.5J.035X260 (WIRE) IMPLANT
PACK CARDIAC CATHETERIZATION (CUSTOM PROCEDURE TRAY) ×1 IMPLANT
SET ATX-X65L (MISCELLANEOUS) IMPLANT
SHEATH PROBE COVER 6X72 (BAG) IMPLANT

## 2024-04-15 NOTE — Discharge Instructions (Signed)
 Radial Site Care  This sheet gives you information about how to care for yourself after your procedure. Your health care provider may also give you more specific instructions. If you have problems or questions, contact your health care provider. What can I expect after the procedure? After the procedure, it is common to have: Bruising and tenderness at the catheter insertion area. Follow these instructions at home: Medicines Take over-the-counter and prescription medicines only as told by your health care provider. Insertion site care Follow instructions from your health care provider about how to take care of your insertion site. Make sure you: Wash your hands with soap and water before you remove your bandage (dressing). If soap and water are not available, use hand sanitizer. May remove dressing in 24 hours. Check your insertion site every day for signs of infection. Check for: Redness, swelling, or pain. Fluid or blood. Pus or a bad smell. Warmth. Do no take baths, swim, or use a hot tub for 5 days. You may shower 24-48 hours after the procedure. Remove the dressing and gently wash the site with plain soap and water. Pat the area dry with a clean towel. Do not rub the site. That could cause bleeding. Do not apply powder or lotion to the site. Activity  For 24 hours after the procedure, or as directed by your health care provider: Do not flex or bend the affected arm. Do not push or pull heavy objects with the affected arm. Do not drive yourself home from the hospital or clinic. You may drive 24 hours after the procedure. Do not operate machinery or power tools. KEEP ARM ELEVATED THE REMAINDER OF THE DAY. Do not push, pull or lift anything that is heavier than 10 lb for 5 days. Ask your health care provider when it is okay to: Return to work or school. Resume usual physical activities or sports. Resume sexual activity. General instructions If the catheter site starts to  bleed, raise your arm and put firm pressure on the site. If the bleeding does not stop, get help right away. This is a medical emergency. DRINK PLENTY OF FLUIDS FOR THE NEXT 2-3 DAYS. No alcohol consumption for 24 hours after receiving sedation. If you went home on the same day as your procedure, a responsible adult should be with you for the first 24 hours after you arrive home. Keep all follow-up visits as told by your health care provider. This is important. Contact a health care provider if: You have a fever. You have redness, swelling, or yellow drainage around your insertion site. Get help right away if: You have unusual pain at the radial site. The catheter insertion area swells very fast. The insertion area is bleeding, and the bleeding does not stop when you hold steady pressure on the area. Your arm or hand becomes pale, cool, tingly, or numb. These symptoms may represent a serious problem that is an emergency. Do not wait to see if the symptoms will go away. Get medical help right away. Call your local emergency services (911 in the [place] Do not drive yourself to the hospital. Summary After the procedure, it is common to have bruising and tenderness at the site. Follow instructions from your health care provider about how to take care of your radial site wound. Check the wound every day for signs of infection.  This information is not intended to replace advice given to you by your health care provider. Make sure you discuss any questions you have with  your health care provider. Document Revised: 11/21/2017 Document Reviewed: 11/21/2017 Elsevier Patient Education  2020 [employer].

## 2024-04-15 NOTE — Interval H&P Note (Signed)
 History and Physical Interval Note:  04/15/2024 12:46 PM  [name]  has presented today for surgery, with the diagnosis of abnormal coronary CT angiogram.  The various methods of treatment have been discussed with the patient and family. After consideration of risks, benefits and other options for treatment, the patient has consented to  Procedure(s): LEFT HEART CATH AND CORONARY ANGIOGRAPHY (N/A)  PERCUTANEOUS CORONARY INTERVENTION  as a surgical intervention.  The patient's history has been reviewed, patient examined, no change in status, stable for surgery.  I have reviewed the patient's chart and labs.  Questions were answered to the patient's satisfaction.    Cath Lab Visit (complete for each Cath Lab visit)  Clinical Evaluation Leading to the Procedure:   ACS: No.  Non-ACS:    Anginal Classification: CCS II  Anti-ischemic medical therapy: Minimal Therapy (1 class of medications)  Non-Invasive Test Results: High-risk stress test findings: cardiac mortality >3%/year  Prior CABG: No previous CABG   Randene Bustard

## 2024-04-21 ENCOUNTER — Ambulatory Visit (HOSPITAL_COMMUNITY)
Admission: RE | Admit: 2024-04-21 | Discharge: 2024-04-21 | Disposition: A | Discharge status: Home / Self Care | Source: Ambulatory Visit | Attending: Surgery | Admitting: Surgery

## 2024-04-21 ENCOUNTER — Ambulatory Visit: Attending: Surgery | Admitting: Physician Assistant

## 2024-04-21 VITALS — BP 143/74 | HR 62 | Temp 97.9°F | Ht 67.0 in | Wt 227.4 lb

## 2024-04-21 DIAGNOSIS — I6522 Occlusion and stenosis of left carotid artery: Secondary | ICD-10-CM

## 2024-04-21 DIAGNOSIS — Z8673 Personal history of transient ischemic attack (TIA), and cerebral infarction without residual deficits: Secondary | ICD-10-CM | POA: Diagnosis present

## 2024-04-21 NOTE — Progress Notes (Signed)
 HISTORY AND PHYSICAL     CC:  follow up. Requesting Provider:  Billy Knee, FNP  HPI: This is a 83 y.o. female here for follow up for carotid artery stenosis.  Pt is s/p left TCAR for symptomatic carotid artery stenosis on 03/07/2024 by Dr. Serene.    Pt returns today for follow up.  Daughter at visit via telephone.      Pt denies any amaurosis fugax, speech difficulties, weakness, numbness, paralysis or clumsiness or facial droop.    She states she does get bruising from the blood pressure cuff on her right forearm.   She is on asa/statin/plavix     Past Medical History:  Diagnosis Date   Abdominal bloating 02/17/2019   Abdominal pain    AKI (acute kidney injury) (HCC) 02/12/2023   Arthritis    cervical disc degeneration/ oa left knee, carpal tunnel rt wrist, adhesive capsulitis right shoulder, rt hand weakness; lumbar degeneration   Carpal tunnel syndrome    CARPAL TUNNEL SYNDROME, RIGHT 10/14/2010   Qualifier: Diagnosis of   By: Eyvonne MD, Debby ORN        CHEST PAIN, ATYPICAL 07/01/2010   Qualifier: Diagnosis of   By: Domenica MD, Harlene Cahill acquired pneumonia 05/06/2020   Complication of anesthesia 2006-at [facility]   breathing problems-no BP med given prior to surgery;  hx of being very sleepy after colon surgery --  states no problems with last right total knee replacement 2013   Constipation    Diabetes mellitus without complication (HCC)    borderline - diet control   Diverticulitis hx of   Diverticulosis    E. coli infection    2020   Frequent UTI    hx of urethral injury during colon surgery - states frequent uti's since   GERD (gastroesophageal reflux disease)    Gout, unspecified 09/09/2007   Qualifier: Diagnosis of   By: Mavis MD, Norleen BRAVO        H. pylori infection 07/18/2010   H/O hiatal hernia    HIP PAIN, LEFT, CHRONIC 11/09/2010   Qualifier: Diagnosis of   By: Mavis MD, Norleen BRAVO        History of colon polyps 08/26/2008   History of  palpitations    in the past   History of shingles    has a lingering itching on back where shingles were   Hyperlipidemia    Hypertension    Hypokalemia 10/19/2022   KNEE PAIN, RIGHT, CHRONIC 10/14/2010   Qualifier: Diagnosis of   By: Eyvonne MD, Debby ORN        Lightheaded 02/17/2019   Morbidly obese (HCC) 05/31/2011   bmi of 45     Numbness and tingling in right hand    pt. 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   Shortness of breath 09/14/2021   Shortness of breath 09/14/2021   Spasm of lumbar paraspinous muscle 06/17/2011   Stroke (HCC)    Stroke-like symptom    Subacute intracranial hemorrhage (HCC) 10/18/2022   Transient speech disturbance 04/18/2022   Urinary frequency 02/17/2019   Vaginal discharge 08/02/2011   Vitamin  D deficiency     Past Surgical History:  Procedure Laterality Date   ABDOMINAL HYSTERECTOMY     bladder tack     BREAST BIOPSY Left    BREAST SURGERY     breast duct resection- benign   CARPAL TUNNEL RELEASE Right 06/04/2014   Procedure: RIGHT CARPAL TUNNEL RELEASE;  Surgeon: Elsie Mussel, MD;  Location: [facility];  Service: Orthopedics;  Laterality: Right;   COLON RESECTION  2008   hx diverticulosis   COLON SURGERY     COLONOSCOPY     colonoscopy [id number]     JOINT REPLACEMENT     LEFT HEART CATH AND CORONARY ANGIOGRAPHY N/A 04/15/2024   Procedure: LEFT HEART CATH AND CORONARY ANGIOGRAPHY;  Surgeon: Anner Alm ORN, MD;  Location: [facility];  Service: Cardiovascular;  Laterality: N/A;   OOPHORECTOMY     POLYPECTOMY     skin graft left arm - traumatic compression injury left upper arm     temporary ureter stent     TOTAL  KNEE ARTHROPLASTY  01/05/2012   Procedure: TOTAL KNEE ARTHROPLASTY;  Surgeon: Dempsey LULLA Moan, MD;  Location: [facility];  Service: Orthopedics;  Laterality: Right;   TOTAL KNEE ARTHROPLASTY Left 08/17/2014   Procedure: LEFT TOTAL KNEE ARTHROPLASTY;  Surgeon: Dempsey Moan LULLA, MD;  Location: [facility];  Service: Orthopedics;  Laterality: Left;   TRANSCAROTID ARTERY REVASCULARIZATION  Left 03/07/2024   Procedure: TRANSCAROTID ARTERY REVASCULARIZATION (TCAR);  Surgeon: Serene Gaile ORN, MD;  Location: [facility];  Service: Vascular;  Laterality: Left;   ureter repair for tyransected left ureter      Allergies  Allergen Reactions   Bee Venom Hives   Latex Hives and Itching   Tape     Burns skin, tolerates paper tape   Codeine Other (See Comments)    Hallucinations    Hydrocodone Other (See Comments)    hallucinations    Oxycodone  Other (See Comments)    hallucinations    Current Outpatient Medications  Medication Sig Dispense Refill   acetaminophen  (TYLENOL ) 325 MG tablet Take 2 tablets (650 mg total) by mouth every 6 (six) hours as needed for mild pain (pain score 1-3) (or Fever >/= 101).     aspirin  EC 81 MG tablet Take 1 tablet (81 mg total) by mouth daily. Swallow whole.     bisacodyl  (FLEET) 10 MG/30ML ENEM Place 10 mg rectally as needed (constipation).     Blood Pressure KIT 1 Units by Does not apply route daily. Check blood pressure daily for hypertension dx code I10 1 kit 0   clopidogrel  (PLAVIX ) 75 MG tablet Take 1 tablet (75 mg total) by mouth daily. 90 tablet 1   Continuous Glucose Receiver (FREESTYLE LIBRE READER) DEVI Use as directed to check blood glucose level 1 each 0   Continuous Glucose Sensor (FREESTYLE LIBRE 3 PLUS SENSOR) MISC Change sensor every 15 days. 2 each 12   Lancets (ONETOUCH DELICA PLUS LANCET33G) MISC USE TO CHECK BLOOD SUGAR TWICE DAILY 100 each 3   Misc. Devices MISC Bedside Commode.M17.12, N39.41, G45.9 1 each 0   Misc. Devices Engineer, civil (consulting). M17.12, N39.41,  G45.9 1 each 0   nitroGLYCERIN  (NITROSTAT ) 0.4 MG SL tablet Place 1 tablet (0.4 mg total) under the tongue every 5 (five) minutes as needed for chest pain. 25 tablet 3   Olmesartan -amLODIPine -HCTZ 40-10-12.5 MG TABS Take 1 tablet by mouth daily.     ONETOUCH VERIO test strip USE TO TEST 2 TIMES  DAILY 100 strip 3   pantoprazole  (PROTONIX ) 40 MG tablet Take 40 mg by mouth daily.     polyethylene glycol (MIRALAX  / GLYCOLAX ) 17 g packet Take 17 g by mouth daily as needed for moderate constipation.     rosuvastatin  (CRESTOR ) 40 MG tablet Take 1 tablet (40 mg total) by mouth daily. 90 tablet 1   solifenacin  (VESICARE ) 5 MG tablet Take 1 tablet (5 mg total) by mouth daily. 90 tablet 1   No current facility-administered medications for this visit.    Family History  Problem Relation Age of Onset   Breast cancer Mother 23   Hypertension Mother    Prostate cancer Father    Hypertension Father    Dementia Sister    Lung cancer Brother    Hypertension Brother    COPD Brother    Cancer Maternal Grandmother    Arthritis Sister    Hypertension Sister    Arthritis Sister    Hypertension Child    Hypertension Child    Hypertension Child    Colon polyps Neg Hx    Esophageal cancer Neg Hx    Rectal cancer Neg Hx    Stomach cancer Neg Hx    Colon cancer Neg Hx     Social History   Socioeconomic History   Marital status: Married    Spouse name: Not on file   Number of children: 6   Years of education: Not on file   Highest education level: Associate degree: occupational, Scientist, product/process development, or vocational program  Occupational History   Occupation: Copywriter, advertising: [employer]  Tobacco Use   Smoking status: Never   Smokeless tobacco: Never  Vaping Use   Vaping status: Never Used  Substance and Sexual Activity   Alcohol use: No   Drug use: No   Sexual activity: Not Currently  Other Topics Concern   Not on file  Social History Narrative   The patient is married and has 6 children    Operates a business   No alcohol tobacco or drug use   Social Drivers of Corporate investment banker Strain: Medium Risk (03/17/2024)   Overall Financial Resource Strain (CARDIA)    Difficulty of Paying Living Expenses: Somewhat hard  Food Insecurity: Food Insecurity Present (03/17/2024)   Hunger Vital Sign    Worried About Running Out of Food in the Last Year: Sometimes true    Ran Out of Food in the Last Year: Sometimes true  Transportation Needs: No Transportation Needs (03/17/2024)   PRAPARE - Administrator, Civil Service (Medical): No    Lack of Transportation (Non-Medical): No  Physical Activity: Inactive (03/17/2024)   Exercise Vital Sign    Days of Exercise per Week: 0 days    Minutes of Exercise per Session: 0 min  Stress: No Stress Concern Present (03/17/2024)   [employer] of Occupational Health - Occupational Stress Questionnaire    Feeling of Stress : Not at all  Social Connections: Socially Integrated (03/17/2024)   Social Connection and Isolation Panel    Frequency of Communication with Friends and Family: More than three times a week    Frequency of Social Gatherings with Friends and Family: Once a week    Attends Religious Services: More than 4 times per year    Active Member of [employer] or Organizations: Patient declined    Attends Engineer, structural: More than 4 times per year    Marital Status: Married  Intimate  Partner Violence: Not At Risk (03/03/2024)   Humiliation, Afraid, Rape, and Kick questionnaire    Fear of Current or Ex-Partner: No    Emotionally Abused: No    Physically Abused: No    Sexually Abused: No   PHYSICAL EXAMINATION:  Today's Vitals   04/21/24 1227  BP: (!) 143/74  Pulse: 62  Temp: 97.9 F (36.6 C)  TempSrc: Temporal  SpO2: 95%  Weight: 227 lb 6.4 oz (103.1 kg)  Height: 5' 7 (1.702 m)  PainSc: 0-No pain   Body mass index is 35.62 kg/m.   General:  WDWN in NAD; vital signs documented above Gait: Not  observed HENT: WNL, normocephalic Pulmonary: normal non-labored breathing Cardiac: without carotid bruits Skin: without rashes Musculoskeletal: no muscle wasting or atrophy  Neurologic: A&O X 3; moving all extremities equally; speech is fluent/normal Psychiatric:  The pt has Normal affect.   Non-Invasive Vascular Imaging:   Carotid Duplex on 04/21/2024 Right:  1-39% ICA stenosis Left:  widely patent stent without hemodynamically significant stenosis     ASSESSMENT/PLAN:: 83 y.o. female here for follow up carotid artery stenosis and has hx of  left TCAR for symptomatic carotid artery stenosis on 03/07/2024 by Dr. Serene.  -duplex today reveals left stent is widely patent and right ICA is 1-39% stenosis -discussed s/s of stroke with pt and she understands should she develop any of these sx, she will go to the nearest ER or call 911. -pt will f/u in 9 months with carotid duplex -pt will call sooner should she have any issues. -continue statin/asa - ok to stop taking Plavix .  -ok to resume vitamins from vascular standpoint.   Lucie Apt, [facility] [phone]  Clinic MD:  pt seen with Dr. Serene

## 2024-04-23 ENCOUNTER — Other Ambulatory Visit: Payer: Self-pay | Admitting: Internal Medicine

## 2024-04-23 DIAGNOSIS — Z1231 Encounter for screening mammogram for malignant neoplasm of breast: Secondary | ICD-10-CM

## 2024-04-30 ENCOUNTER — Telehealth: Payer: Self-pay | Admitting: Internal Medicine

## 2024-04-30 NOTE — Telephone Encounter (Signed)
 Patient dropped off document Home Health Certificate (Order ID [id number]), to be filled out by provider. Patient requested to send it back via Fax within 7-days. Document is located in providers tray at front office.Please advise at [phone]

## 2024-05-01 ENCOUNTER — Ambulatory Visit

## 2024-05-01 NOTE — Telephone Encounter (Signed)
 Form placed in PCP folder.

## 2024-05-06 ENCOUNTER — Ambulatory Visit: Attending: Physician Assistant | Admitting: Physician Assistant

## 2024-05-06 ENCOUNTER — Encounter: Payer: Self-pay | Admitting: Physician Assistant

## 2024-05-06 VITALS — BP 120/62 | HR 84 | Ht 67.0 in | Wt 229.8 lb

## 2024-05-06 DIAGNOSIS — I493 Ventricular premature depolarization: Secondary | ICD-10-CM | POA: Diagnosis not present

## 2024-05-06 DIAGNOSIS — I2510 Atherosclerotic heart disease of native coronary artery without angina pectoris: Secondary | ICD-10-CM | POA: Diagnosis not present

## 2024-05-06 DIAGNOSIS — Z79899 Other long term (current) drug therapy: Secondary | ICD-10-CM

## 2024-05-06 DIAGNOSIS — I6522 Occlusion and stenosis of left carotid artery: Secondary | ICD-10-CM

## 2024-05-06 DIAGNOSIS — E119 Type 2 diabetes mellitus without complications: Secondary | ICD-10-CM

## 2024-05-06 DIAGNOSIS — I10 Essential (primary) hypertension: Secondary | ICD-10-CM | POA: Diagnosis not present

## 2024-05-06 DIAGNOSIS — E785 Hyperlipidemia, unspecified: Secondary | ICD-10-CM | POA: Diagnosis not present

## 2024-05-06 MED ORDER — EZETIMIBE 10 MG PO TABS: 10.0000 mg | ORAL_TABLET | Freq: Every day | ORAL | 3 refills | Status: AC

## 2024-05-06 NOTE — Progress Notes (Unsigned)
 Cardiology Office Note   Date:  05/06/2024  ID:  [name], [name] [birth year], MRN [id number] PCP: Billy Knee, FNP  [facility] Providers Cardiologist:  None { Click to update primary MD,subspecialty MD or APP then REFRESH:1}    History of Present Illness [name] is a 83 y.o. female with past medical history of hypertension, hyperlipidemia, DM2, history of CVA, intracranial hemorrhage 12/23, carotid artery disease s/p recent left TCAR, LBBB and coronary artery calcification.  Myoview  in October 2018 showed EF 50%, low risk study with normal perfusion.  Echocardiogram in June 2023 and also in April 2024 showed EF 50 to 55%. 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 Neurology was consulted at the time and believe the finding was due to poorly controlled high blood pressure.  Patient was previously seen by Dr. Raford in November 2022 and established with Dr. Norleen Blare of [facility].  Recently, patient went to the [facility] ED due to increased urinary frequency.  She had a low-grade fever of 100.2.  Lactic acid was normal.  Serial troponin however was elevated, therefore cardiology service was consulted.  She tested negative for COVID, RSV and influenza.  Urinalysis was also negative.  She received IV Rocephin  due to fever of unknown etiology.  On telemetry, she had fairly frequent PVCs, therefore we were unable to obtain coronary CT.  Dr. Okey recommended outpatient coronary CTA and cardiology service will sign off.  Later during the admission, she was found to have greater than 70% left carotid artery stenosis and was seen by vascular surgery.  Cardiology service consulted for preop clearance and she underwent nuclear stress  test that showed EF 38%, small to mid sized fixed defect in the distal anteroseptal wall, otherwise no ischemia.    Patient eventually underwent left carotid stenting by Dr. Serene on 03/07/2024.  She was discharged with a home monitor for frequent PVCs.  Her monitor showed sinus rhythm with PVC burden of 7.1%, no significant arrhythmia.  She eventually underwent outpatient coronary CTA on 03/28/2024 which showed 50 to 69% proximal RCA lesion, 50 to 69% mid RCA lesion, 25 to 49% distal RCA lesion, 50% left main lesion 25 to 49% proximal LAD lesion, 25 to 49% mid LAD lesion, 25 to 49% D3 lesion, 25 to 29% proximal left circumflex lesion.  Subsequent FFR showed 0.74 across the proximal left main lesion suggesting the lesion was hemodynamically significant, FFR falls from 0.74-0.69 in proximal LAD, CT FFR further falls to 0.56 across the lesion in mid LAD, he had FFR of 0.92 in proximal RCA which falls to 0.74 across the mid RCA lesion suggesting it is hemodynamically significant.  Due to hemodynamically significant lesion left main in the mid RCA, and borderline significant FFR in mid LAD, cardiac catheterization was recommended.  I last saw the patient on 04/09/2024, due to abnormal coronary CTA, we ultimately recommend diagnostic cardiac catheterization.  Cardiac catheterization performed on 04/15/2024 showed 50% mid LAD lesion, 50% proximal to mid RCA lesion, 50% stenosis inside branch of RV branch, no hemodynamically significant lesion was seen.  Patient was discharged on the same day.  She presents today for follow-up.  She has recently discontinued Plavix  after completing her dual antiplatelet therapy post TCAR.  She requested me to check with Dr. Skeet to see if  he would like her to continue on the Plavix .  At this time, she denies any chest pain or shortness of breath.  I reviewed the recent cardiac catheterization report with the patient.  She has no lower extremity edema, orthopnea or PND.  Her LDL is still  not at goal, I recommend the addition of Zetia  10 mg daily.  She will need a fasting lipid panel and the liver function test in 3 months.  She can follow-up with Dr. Okey in 6 months  ROS: ***  Studies Reviewed      *** Risk Assessment/Calculations {Does this patient have ATRIAL FIBRILLATION?:[phone]}         Physical Exam VS:  BP 120/62   Pulse 84   Ht 5' 7 (1.702 m)   Wt 229 lb 12.8 oz (104.2 kg)   SpO2 97%   BMI 35.99 kg/m        Wt Readings from Last 3 Encounters:  05/06/24 229 lb 12.8 oz (104.2 kg)  04/21/24 227 lb 6.4 oz (103.1 kg)  04/15/24 227 lb (103 kg)    GEN: Well nourished, well developed in no acute distress NECK: No JVD; No carotid bruits CARDIAC: ***RRR, no murmurs, rubs, gallops RESPIRATORY:  Clear to auscultation without rales, wheezing or rhonchi  ABDOMEN: Soft, non-tender, non-distended EXTREMITIES:  No edema; No deformity   ASSESSMENT AND PLAN ***    {Are you ordering a CV Procedure (e.g. stress test, cath, DCCV, TEE, etc)?   Press F2        :[id number]}  Dispo: ***  Signed, Scot Ford, PA

## 2024-05-06 NOTE — Patient Instructions (Signed)
 Medication Instructions:  START ZETIA  10 MG DAILY *If you need a refill on your cardiac medications before your next appointment, please call your pharmacy*  Lab Work: FASTING LIPID PANEL AND LFT IN 3 MONTHS If you have labs (blood work) drawn today and your tests are completely normal, you will receive your results only by: MyChart Message (if you have MyChart) OR A paper copy in the mail If you have any lab test that is abnormal or we need to change your treatment, we will call you to review the results.  Testing/Procedures: NO TESTING  Follow-Up: At [facility], you and your health needs are our priority.  As part of our continuing mission to provide you with exceptional heart care, our providers are all part of one team.  This team includes your primary Cardiologist (physician) and Advanced Practice Providers or APPs (Physician Assistants and Nurse Practitioners) who all work together to provide you with the care you need, when you need it.  Your next appointment:   6 month(s)  Provider:   Vina Gull, MD

## 2024-05-07 ENCOUNTER — Other Ambulatory Visit: Payer: Self-pay

## 2024-05-09 ENCOUNTER — Ambulatory Visit
Admission: RE | Admit: 2024-05-09 | Discharge: 2024-05-09 | Disposition: A | Discharge status: Home / Self Care | Source: Ambulatory Visit | Attending: Internal Medicine | Admitting: Internal Medicine

## 2024-05-09 DIAGNOSIS — Z1231 Encounter for screening mammogram for malignant neoplasm of breast: Secondary | ICD-10-CM

## 2024-05-14 ENCOUNTER — Ambulatory Visit: Payer: Self-pay | Admitting: Internal Medicine

## 2024-05-25 LAB — LAB REPORT - SCANNED
Albumin, Urine POC: 14.3
Albumin/Creatinine Ratio, Urine, POC: 21
Creatinine, POC: 68.7 mg/dL
EGFR: 49

## 2024-06-12 ENCOUNTER — Emergency Department (HOSPITAL_BASED_OUTPATIENT_CLINIC_OR_DEPARTMENT_OTHER)
Admission: EM | Admit: 2024-06-12 | Discharge: 2024-06-12 | Disposition: A | Discharge status: Home / Self Care | Attending: Emergency Medicine | Admitting: Emergency Medicine

## 2024-06-12 ENCOUNTER — Other Ambulatory Visit: Payer: Self-pay

## 2024-06-12 ENCOUNTER — Emergency Department (HOSPITAL_BASED_OUTPATIENT_CLINIC_OR_DEPARTMENT_OTHER): Admitting: Radiology

## 2024-06-12 DIAGNOSIS — Z91040 Latex allergy status: Secondary | ICD-10-CM | POA: Insufficient documentation

## 2024-06-12 DIAGNOSIS — M25552 Pain in left hip: Secondary | ICD-10-CM | POA: Diagnosis not present

## 2024-06-12 DIAGNOSIS — M79632 Pain in left forearm: Secondary | ICD-10-CM | POA: Insufficient documentation

## 2024-06-12 DIAGNOSIS — W19XXXA Unspecified fall, initial encounter: Secondary | ICD-10-CM

## 2024-06-12 DIAGNOSIS — M542 Cervicalgia: Secondary | ICD-10-CM | POA: Diagnosis not present

## 2024-06-12 DIAGNOSIS — E119 Type 2 diabetes mellitus without complications: Secondary | ICD-10-CM | POA: Diagnosis not present

## 2024-06-12 DIAGNOSIS — W010XXA Fall on same level from slipping, tripping and stumbling without subsequent striking against object, initial encounter: Secondary | ICD-10-CM | POA: Insufficient documentation

## 2024-06-12 DIAGNOSIS — Z79899 Other long term (current) drug therapy: Secondary | ICD-10-CM | POA: Insufficient documentation

## 2024-06-12 DIAGNOSIS — Z7902 Long term (current) use of antithrombotics/antiplatelets: Secondary | ICD-10-CM | POA: Diagnosis not present

## 2024-06-12 DIAGNOSIS — I10 Essential (primary) hypertension: Secondary | ICD-10-CM | POA: Insufficient documentation

## 2024-06-12 DIAGNOSIS — S46912A Strain of unspecified muscle, fascia and tendon at shoulder and upper arm level, left arm, initial encounter: Secondary | ICD-10-CM | POA: Insufficient documentation

## 2024-06-12 DIAGNOSIS — Z8673 Personal history of transient ischemic attack (TIA), and cerebral infarction without residual deficits: Secondary | ICD-10-CM | POA: Insufficient documentation

## 2024-06-12 DIAGNOSIS — M25522 Pain in left elbow: Secondary | ICD-10-CM | POA: Diagnosis not present

## 2024-06-12 DIAGNOSIS — Z7982 Long term (current) use of aspirin: Secondary | ICD-10-CM | POA: Diagnosis not present

## 2024-06-12 DIAGNOSIS — M25512 Pain in left shoulder: Secondary | ICD-10-CM | POA: Diagnosis present

## 2024-06-12 MED ORDER — KETOROLAC TROMETHAMINE 15 MG/ML IJ SOLN
15.0000 mg | Freq: Once | INTRAMUSCULAR | Status: AC
  Administered 2024-06-12: 15 mg via INTRAMUSCULAR
  Filled 2024-06-12: qty 1

## 2024-06-12 NOTE — ED Triage Notes (Signed)
 Pt POV reporting L arm pain after fall, was getting out of chair, began to walk and tripped on rug, fell on L side on to carpet. Dit not hit head, no longer taking Plavix .

## 2024-06-13 NOTE — ED Provider Notes (Signed)
 [place] EMERGENCY DEPARTMENT AT [facility] Provider Note   CSN: [id number] Arrival date & time: 06/12/24  2140     Patient presents with: [place]   [name] is a 83 y.o. female.    Fall  Patient presents after fall.  Loss of balance cannula chair and tripped.  Pain on left side.  No loss conscious.  Did not hit head.  Does have some neck pain however.  Pain in left shoulder left elbow left hip.  Does have chronic shoulder pain and sees Dr. Heide    Past Medical History:  Diagnosis Date   Abdominal bloating 02/17/2019   Abdominal pain    AKI (acute kidney injury) (HCC) 02/12/2023   Arthritis    cervical disc degeneration/ oa left knee, carpal tunnel rt wrist, adhesive capsulitis right shoulder, rt hand weakness; lumbar degeneration   Carpal tunnel syndrome    CARPAL TUNNEL SYNDROME, RIGHT 10/14/2010   Qualifier: Diagnosis of   By: Eyvonne MD, Debby LELON        CHEST PAIN, ATYPICAL 07/01/2010   Qualifier: Diagnosis of   By: Domenica MD, Harlene Cahill acquired pneumonia 05/06/2020   Complication of anesthesia 2006-at [facility]   breathing problems-no BP med given prior to surgery;  hx of being very sleepy after colon surgery --  states no problems with last right total knee replacement 2013   Constipation    Diabetes mellitus without complication (HCC)    borderline - diet control   Diverticulitis hx of   Diverticulosis    E. coli infection    2020   Frequent UTI    hx of urethral injury during colon surgery - states frequent uti's since   GERD (gastroesophageal reflux disease)    Gout, unspecified 09/09/2007   Qualifier: Diagnosis of   By: Mavis MD, Norleen BRAVO        H. pylori infection 07/18/2010   H/O hiatal hernia    HIP PAIN, LEFT, CHRONIC 11/09/2010   Qualifier: Diagnosis of   By: Mavis MD, Norleen BRAVO        History of colon polyps 08/26/2008   History of palpitations    in the past   History of shingles    has a lingering itching on back  where shingles were   Hyperlipidemia    Hypertension    Hypokalemia 10/19/2022   KNEE PAIN, RIGHT, CHRONIC 10/14/2010   Qualifier: Diagnosis of   By: Eyvonne MD, Debby LELON        Lightheaded 02/17/2019   Morbidly obese (HCC) 05/31/2011   bmi of 44     Numbness and tingling in right hand    pt. 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 Authorizing Provider  acetaminophen  (TYLENOL ) 325 MG tablet Take 2 tablets (650 mg total) by mouth every 6 (six) hours as needed for mild pain (pain score 1-3) (or Fever >/= 101). 03/08/24   Sebastian Toribio GAILS, MD  aspirin  EC 81 MG tablet Take 1 tablet (81 mg total) by mouth daily. Swallow whole. 03/08/24 09/04/24  Sebastian Toribio GAILS, MD  bisacodyl  (FLEET) 10 MG/30ML ENEM Place 10 mg rectally as needed (constipation).    [provider]  Blood Pressure KIT 1 Units by Does not apply route daily. Check blood pressure daily for hypertension dx code I10 09/14/21   Raford Riggs, MD  clopidogrel  (PLAVIX ) 75 MG tablet Take 1 tablet (75 mg total) by mouth daily. Patient not taking: Reported on 05/06/2024 03/09/24 03/09/25  Sebastian Toribio GAILS, MD  Continuous Glucose Receiver (FREESTYLE LIBRE READER) DEVI Use as directed to check blood glucose level 03/28/24    Billy Knee, FNP  Continuous Glucose Sensor (FREESTYLE LIBRE 3 PLUS SENSOR) MISC Change sensor every 15 days. 03/28/24   Billy Knee, FNP  ezetimibe  (ZETIA ) 10 MG tablet Take 1 tablet (10 mg total) by mouth daily. 05/06/24 08/04/24  Meng, Hao, PA  Lancets (ONETOUCH DELICA PLUS [place]) MISC USE TO CHECK BLOOD SUGAR TWICE DAILY 12/24/20   Jarold Medici, MD  Misc. Devices MISC Bedside Commode.M17.12, N39.41, G45.9 03/13/24   Billy Knee, FNP  Misc. Devices Engineer, civil (consulting). M17.12, N39.41, G45.9 03/13/24   Billy Knee, FNP  nitroGLYCERIN  (NITROSTAT ) 0.4 MG SL tablet Place 1 tablet (0.4 mg total) under the tongue every 5 (five) minutes as needed for chest pain. 04/01/24 06/30/24  Meng, Hao, PA  Olmesartan -amLODIPine -HCTZ 40-10-12.5 MG TABS Take 1 tablet by mouth daily. 03/10/24   Sebastian Toribio GAILS, MD  [facility] VERIO test strip USE TO TEST 2 TIMES DAILY 12/20/20   Jarold Medici, MD  pantoprazole  (PROTONIX ) 40 MG tablet Take 40 mg by mouth daily. Patient not taking: Reported on 05/06/2024 06/02/23   [provider]  polyethylene glycol (MIRALAX  / GLYCOLAX ) 17 g packet Take 17 g by mouth daily as needed for moderate constipation.    [provider]  rosuvastatin  (CRESTOR ) 40 MG tablet Take 1 tablet (40 mg total) by mouth daily. 03/08/24   Sebastian Toribio GAILS, MD  solifenacin  (VESICARE ) 5 MG tablet Take 1 tablet (5 mg total) by mouth daily. 10/04/23   Billy Knee, FNP    Allergies: Bee venom, Latex, Tape, Codeine, Hydrocodone, and Oxycodone     Review of Systems  Updated Vital Signs BP 120/60   Pulse 80   Temp 98.1 F (36.7 C) (Oral)   Resp 20   Ht 5' 9 (1.753 m)   Wt 100.7 kg   SpO2 95%   BMI 32.78 kg/m   Physical Exam Vitals reviewed.  HENT:     Head: Atraumatic.  Chest:     Chest wall: No tenderness.  Abdominal:     Tenderness: There is no abdominal tenderness.  Musculoskeletal:        General: Tenderness present.     Cervical back: Neck supple. No tenderness.      Comments: Some tenderness to left shoulder.  Held somewhat across body.  Also left elbow and forearm tenderness.  No deformity.  Neurovasc intact in the hand.  Left hip tenderness but no deformity.  Neurological:     Mental Status: She is alert.     (all labs ordered are listed, but only abnormal results are displayed)  Labs Reviewed - No data to display  EKG: None  Radiology: DG HIP UNILAT WITH PELVIS 2-3 VIEWS LEFT Result Date: 06/12/2024 CLINICAL DATA:  Recent fall with left hip pain, initial encounter EXAM: DG HIP (WITH OR WITHOUT PELVIS) 3V LEFT COMPARISON:  None Available. FINDINGS: Pelvic ring is intact. No acute fracture or dislocation is noted. Degenerative changes of left hip joint are seen. No soft tissue changes are seen. IMPRESSION: No acute abnormality noted. Electronically Signed   By: Oneil Devonshire M.D.   On: 06/12/2024 22:44   DG Forearm Left Result Date: 06/12/2024 CLINICAL DATA:  Recent fall with left arm pain, initial encounter EXAM: LEFT FOREARM - 2 VIEW COMPARISON:  None Available. FINDINGS: No acute fracture or dislocation is noted. No soft tissue abnormality is seen. Degenerative changes in the wrist joint are noted. IMPRESSION: No acute abnormality noted. Electronically Signed   By: Oneil Devonshire M.D.   On: 06/12/2024 22:43   DG Shoulder Left Result Date: 06/12/2024 CLINICAL DATA:  Recent fall with left shoulder pain, initial encounter EXAM: LEFT SHOULDER - 2+ VIEW COMPARISON:  None Available. FINDINGS: Degenerative changes of the acromioclavicular and glenohumeral joints are seen. No acute fracture or dislocation is seen. No soft tissue abnormality is noted. IMPRESSION: Degenerative change without acute abnormality. Electronically Signed   By: Oneil Devonshire M.D.   On: 06/12/2024 22:41   DG Humerus Left Result Date: 06/12/2024 CLINICAL DATA:  Recent fall with left arm pain, initial encounter EXAM: LEFT HUMERUS - 2+ VIEW COMPARISON:  03/02/2022 FINDINGS: Mild degenerative  changes of left shoulder joint are seen. Humerus appears intact. No soft tissue abnormality is noted. IMPRESSION: No acute abnormality noted. Electronically Signed   By: Oneil Devonshire M.D.   On: 06/12/2024 22:40     Procedures   Medications Ordered in the ED  ketorolac  (TORADOL ) 15 MG/ML injection 15 mg (15 mg Intramuscular Given 06/12/24 2324)                                    Medical Decision Making Amount and/or Complexity of Data Reviewed Radiology: ordered.  Risk Prescription drug management.   Patient with fall.  Multiple areas of pain particularly left shoulder left elbow and left hip.  X-ray reassuring.  Differential diagnosis to admission include fractures dislocations contusions.  Does have previously known rotator cuff tear.  Will give sling for comfort.  Given some Toradol  here.  Was initially plan to give opiates for home but apparently hallucinates with it.  Will continue Motrin or Tylenol .  Will discharge     Final diagnoses:  Fall, initial encounter  Strain of left shoulder, initial encounter    ED Discharge Orders     None          Patsey Lot, MD 06/13/24 1450

## 2024-07-09 ENCOUNTER — Ambulatory Visit: Payer: Medicare Other | Admitting: Podiatry

## 2024-07-18 ENCOUNTER — Ambulatory Visit: Admitting: Internal Medicine

## 2024-07-23 ENCOUNTER — Encounter: Payer: Self-pay | Admitting: Podiatry

## 2024-07-23 ENCOUNTER — Ambulatory Visit (INDEPENDENT_AMBULATORY_CARE_PROVIDER_SITE_OTHER): Admitting: Podiatry

## 2024-07-23 VITALS — Ht 69.0 in | Wt 222.0 lb

## 2024-07-23 DIAGNOSIS — E1122 Type 2 diabetes mellitus with diabetic chronic kidney disease: Secondary | ICD-10-CM | POA: Diagnosis not present

## 2024-07-23 DIAGNOSIS — N1831 Chronic kidney disease, stage 3a: Secondary | ICD-10-CM

## 2024-07-23 DIAGNOSIS — M79609 Pain in unspecified limb: Secondary | ICD-10-CM

## 2024-07-23 DIAGNOSIS — B351 Tinea unguium: Secondary | ICD-10-CM

## 2024-07-23 NOTE — Progress Notes (Signed)
  Subjective:  Patient ID: [name], female    DOB: Sep 29, 1941,  MRN: [id number]  [name] presents to clinic today for: at risk foot care. Pt has h/o NIDDM with chronic kidney disease and painful elongated mycotic toenails 1-5 bilaterally which are tender when wearing enclosed shoe gear. Pain is relieved with periodic professional debridement.  Chief Complaint  Patient presents with   Nail Problem    RM 16 RFC. Pt is not diabetic. PCP-Dr. Leotis Harris-last visit 06/2024.    PCP is Arloa Jarvis, NP.  Allergies  Allergen Reactions   Bee Venom Hives   Latex Hives and Itching   Tape     Burns skin, tolerates paper tape   Codeine Other (See Comments)    Hallucinations    Hydrocodone Other (See Comments)    hallucinations    Oxycodone  Other (See Comments)    hallucinations    Review of Systems: Negative except as noted in the HPI.  Objective:  There were no vitals filed for this visit.  [name] is a pleasant 83 y.o. female in NAD. AAO x 3.  Vascular Examination: Vascular Examination: CFT <3 seconds b/l. DP/PT pulses faintly palpable b/l. Skin temperature gradient warm to warm b/l. No pain with calf compression. No ischemia or gangrene. No cyanosis or clubbing noted b/l.    Neurological Examination: Sensation grossly intact b/l with 10 gram monofilament. Vibratory sensation intact b/l.   Dermatological Examination: Pedal skin warm and supple b/l.   No open wounds. No interdigital macerations.  Toenails 1-5 b/l l thick, discolored, elongated with subungual debris and pain on dorsal palpation.    Musculoskeletal Examination: Patient is able to move all digits of right foot. No pain or limitations of right foot. Normal ROM. Adductovarus deformity 3-5 bilaterally.  Radiographs: None     Latest Ref Rng & Units 03/05/2024    5:12 AM 10/19/2023   10:07 AM  Hemoglobin A1C  Hemoglobin-A1c 4.8 - 5.6 % 7.7  7.5    Assessment/Plan: 1. 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 Type 2 diabetes mellitus with stage 3a chronic kidney disease, without long-term current use of insulin  [facility])    Consent given for treatment. Patient examined. All patient's and/or POA's questions/concerns addressed on today's visit. Mycotic toenails 1-5 debrided in length and girth without incident. Continue foot and shoe inspections daily. Monitor blood glucose per PCP/Endocrinologist's recommendations.Continue soft, supportive shoe gear daily. Report any pedal injuries to medical professional. Call office if there are any quesitons/concerns. -Patient/POA to call should there be question/concern in the interim.   Return in about 3 months (around 10/22/2024).  Delon LITTIE Merlin, DPM      [place] LOCATION: [address] [place] [address]                   Office [phone]   [facility] LOCATION: [address] [place], [place] [address] Office [phone]

## 2024-07-23 NOTE — Progress Notes (Deleted)
 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  LDL goal less than 70.  Recheck lipid panel Glycemic control.  Hgb A1c goal less than 70 Follow up with vascular surgery for monitoring of ICA Mediterranean diet.   Follow up ***     Subjective:  [name] is an 83 year old female with HTN, DM II, CKD and history of TIA who follows up for recent hospital admission.  History supplemented by hospital records.  She is accompanied by her husband in person and daughter via phone.  UPDATE: Current medications:  ASA 81mg , Plavix  75mg , hydralazine , olmesartan -amlodipine -HCTZ, glipizide, rosuvastatin  40mg   Followed up with vascular surgery.  Carotid US  on 6/23 revealed widely patent stent in left carotid without hemodynamically significant stenosis.    Followed up with cardiology for CAD.  Catheterization in July revealed no significant obstructive disease.    Since discharge in May, she reports trouble remembering things.  Notes some lightheadedness and generalized weakness.  Cannot walk for prolonged period of time.  Has follow up with vascular surgery next month.    HISTORY: On 10/13/2022, she began experiencing acute onset of new headaches, described as severe paroxysmal right occipital-temporal stabbing headaches lasting a few seconds and occurring about every 10 minutes.  No associated nausea, vomiting, new visual disturbance, or unilateral numbness or weakness.  She took her blood pressure which was in the 200s systolic.  She went to the ED  on 12/18 but left before being seen.  Headaches continued, so she returned to a different ED at [facility] on 12/20 where CT head revealed a 1.3 cm hyperdense focus within the right occipital lobe suspicious for hemorrhage.  CTA of head revealed no LVO or other vascular abnormality.  CTV of head revealed no cerebral venous sinus thrombosis.  She was transferred to [facility] for continued workup and management.  MRI of brain revealed early subacute hemorrage within the right occipital lobe.  She was already on ASA 81mg , which was put on hold.  LDL 98.  Hgb A1c 6.4.  She was admitted and monitored.  Repeat CT Head on 12/22 revealed hemorrhage was unchanged.  She was discharged in stable condition.  She is still not on ASA. Reports that she has trouble seeing, worse since the hospital but ongoing prior to the hospitalization.  She followed up with her eye doctor and exam is stable.    She began having a recurrence of headache in 2024.  On 02/11/2023, she started experiencing intermittent left arm and facial numbness.  She was admitted to [facility] where labs revealed AKI with BUN 37 and Cr 1.70.  CT head revealed no acute bleed or ischemic stroke.  MRI of brain revealed chronic small vessel ischemic changes and multiple scattered chronic microhemorrhages but no acute stroke.  MRA of head revealed no LVO or hemodynamically significant stenosis, aneurysm or other abnormality.  Carotid Doppler revealed no hemodynamically significant stenosis.  LDL was 198 and Hgb A1c 6.1.  She apparently was not taking the statin.  She was discharged on DAPT for 3 weeks and started on rosuvastatin  40mg .  On 03/04/2024, patient was  admitted for urosepsis presenting with fever, dysuria, some confusion and generalized weakness, when she had sudden onset aphasia where she was unable to answer questions, perseverating and not following commands.  She had bilateral lower extremity weakness but no focal or unilateral  weakness.  She did not receive TNK due to prior intracranial hemorrhage.  MRI of brain revealed no acute bleed or ischemic stroke.  CTA head and neck revealed moderate calcific 70% left carotid stenosis.  Underwent elective left carotid TCAR.  2D echo revealed EF 50-55% with calcified aortic valve.  LDL was 90 and Hgb A1c was 7.7.  Discharged on ASA and Plavix  with followup with vascular surgery.     Past imaging: 10/18/2022 CT HEAD WO:  1.3 cm hyperdense focus within the right occipital lobe. This may reflect an acute parenchymal hemorrhage. However, a brain MRI without and with contrast is recommended to exclude a primary mass or metastatic lesion.  Moderate chronic small ischemic changes within the cerebral white matter. 10/18/2022 CTA/CTV HEAD:  1. No intracranial large vessel occlusion. No evidence of vascular malformation.  2. 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  3. Moderate stenosis in the right mid V4 segment.  4. No evidence of venous sinus thrombosis or stenosis. The right transverse and sigmoid sinus are hypoplastic, which appears similar to the 04/18/2022 CTA head. 10/18/2022 MRI BRAIN W WO:  1. Early subacute hemorrhage in the right occipital lobe, with a volume of approximately 1 mL. Surrounding edema without significant mass effect or midline shift. No evidence of active hemorrhage.  2. Multiple foci of hemosiderin deposition, most prominently in the parietal and occipital lobes; with the exception of the new subacute hemorrhage, the others appear unchanged compared to 04/17/2022.  While these could be the sequela of hypertensive microhemorrhages, early cerebral amyloid angiopathy or multiple cavernomas could appear similar. 10/20/2022 CT HEAD WO:  Unchanged hemorrhage in the right occipital lobe, further characterized on recent MRI.  02/12/2023 MRI BRAIN WO:  1. No acute intracranial infarct or other abnormality. 2.  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 3. Multiple scattered chronic micro hemorrhages, most pronounced about the parieto-occipital regions bilaterally. Findings are favored to be hypertensive in nature. 02/12/2023 MRA HEAD:  1. Negative intracranial MRA for large vessel occlusion. 2. Mild intracranial atheromatous disease, but no hemodynamically significant or correctable stenosis. 02/12/2023 CAROTID US :  Right Carotid: Velocities in the right ICA are consistent with a 1-39% stenosis.  Left Carotid: Velocities in the left ICA are consistent with a 1-39% stenosis.  Vertebrals: Bilateral vertebral arteries demonstrate antegrade flow.  Subclavians: Normal flow hemodynamics were seen in bilateral subclavian   03/04/2024 CTA HEAD & NECK:  Atherosclerotic disease at both carotid bifurcations with 70% stenosis of both proximal internal carotid arteries.  Atherosclerotic disease in the carotid siphon regions with severe stenosis estimated at 30-50% on both sides.  Atherosclerotic disease in the right V4 segment with stenosis estimated at 50%. 03/04/2024 MRI BRAIN WO:  No acute infarction or acute hemorrhage.  Small remote hemorrhage in the left posterior temoral-occipital region. Moderate T2/FLAIR hyperintensities the white matter, nonspecific but compatible with chronic microvascular ischemic disease.   PAST MEDICAL HISTORY: Past Medical History:  Diagnosis Date   Abdominal bloating 02/17/2019   Abdominal pain    AKI (acute kidney injury) 02/12/2023   Arthritis    cervical disc degeneration/ oa left knee, carpal tunnel rt wrist, adhesive capsulitis right shoulder, rt hand weakness; lumbar degeneration  Carpal tunnel syndrome    CARPAL TUNNEL SYNDROME, RIGHT 10/14/2010   Qualifier: Diagnosis of   By: Eyvonne MD, Debby ORN        CHEST PAIN, ATYPICAL 07/01/2010   Qualifier: Diagnosis of   By: Domenica MD, Harlene Cahill acquired pneumonia 05/06/2020   Complication of  anesthesia 2006-at [facility]   breathing problems-no BP med given prior to surgery;  hx of being very sleepy after colon surgery --  states no problems with last right total knee replacement 2013   Constipation    Diabetes mellitus without complication (HCC)    borderline - diet control   Diverticulitis hx of   Diverticulosis    E. coli infection    2020   Frequent UTI    hx of urethral injury during colon surgery - states frequent uti's since   GERD (gastroesophageal reflux disease)    Gout, unspecified 09/09/2007   Qualifier: Diagnosis of   By: Mavis MD, Norleen BRAVO        H. pylori infection 07/18/2010   H/O hiatal hernia    HIP PAIN, LEFT, CHRONIC 11/09/2010   Qualifier: Diagnosis of   By: Mavis MD, Norleen BRAVO        History of colon polyps 08/26/2008   History of palpitations    in the past   History of shingles    has a lingering itching on back where shingles were   Hyperlipidemia    Hypertension    Hypokalemia 10/19/2022   KNEE PAIN, RIGHT, CHRONIC 10/14/2010   Qualifier: Diagnosis of   By: Eyvonne MD, Debby ORN        Lightheaded 02/17/2019   Morbidly obese (HCC) 05/31/2011   bmi of 44     Numbness and tingling in right hand    pt. 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     aspirin  EC 81 MG tablet Take 1 tablet (81 mg total) by mouth daily. Swallow whole.     bisacodyl  (FLEET) 10 MG/30ML ENEM Place 10 mg rectally as needed (constipation).     Blood Pressure KIT 1 Units by Does not apply route daily. Check blood pressure daily for hypertension dx code I10 1 kit 0   clopidogrel  (PLAVIX ) 75 MG tablet Take 1 tablet (75 mg total) by mouth daily. (Patient not taking: Reported on 05/06/2024) 90 tablet 1   Continuous Glucose Receiver (FREESTYLE LIBRE READER) DEVI Use as directed to check blood glucose level 1 each 0  Continuous Glucose Sensor (FREESTYLE LIBRE 3 PLUS SENSOR) MISC Change sensor every 15 days. 2 each 12   ezetimibe  (ZETIA ) 10 MG tablet Take 1 tablet (10 mg total) by mouth daily. 90 tablet 3   Lancets (ONETOUCH DELICA PLUS LANCET33G) MISC USE TO CHECK BLOOD SUGAR TWICE DAILY 100 each 3   Misc. Devices MISC Bedside Commode.M17.12, N39.41, G45.9 1 each 0   Misc. Devices Engineer, civil (consulting). M17.12, N39.41, G45.9 1 each 0   nitroGLYCERIN  (NITROSTAT ) 0.4 MG SL tablet Place 1 tablet (0.4 mg total) under the tongue every 5 (five) minutes as needed for chest pain. 25 tablet 3   Olmesartan -amLODIPine -HCTZ 40-10-12.5 MG TABS Take 1 tablet by mouth daily.     ONETOUCH VERIO test strip USE TO TEST 2 TIMES DAILY 100 strip 3   pantoprazole  (PROTONIX ) 40 MG tablet Take 40 mg by mouth daily. (Patient not taking: Reported on 05/06/2024)     polyethylene glycol (MIRALAX  / GLYCOLAX ) 17 g packet Take 17 g by mouth daily as needed for moderate constipation.     rosuvastatin  (CRESTOR ) 40 MG tablet Take 1 tablet (40 mg total) by mouth daily. 90 tablet 1   solifenacin  (VESICARE ) 5 MG tablet Take 1 tablet (5 mg total) by mouth daily. 90 tablet 1   No current facility-administered medications on  file prior to visit.    ALLERGIES: Allergies  Allergen Reactions   Bee Venom Hives   Latex Hives and Itching   Tape     Burns skin, tolerates paper tape   Codeine Other (See Comments)    Hallucinations    Hydrocodone Other (See Comments)    hallucinations    Oxycodone  Other (See Comments)    hallucinations    FAMILY HISTORY: Family History  Problem Relation Age of Onset   Breast cancer Mother 29   Hypertension Mother    Prostate cancer Father    Hypertension Father    Dementia Sister    Lung cancer Brother    Hypertension Brother    COPD Brother    Cancer Maternal Grandmother    Arthritis Sister    Hypertension Sister    Arthritis Sister    Hypertension Child    Hypertension Child    Hypertension Child    Colon polyps Neg Hx    Esophageal cancer Neg Hx    Rectal cancer Neg Hx    Stomach cancer Neg Hx    Colon cancer Neg Hx       Objective:  *** General: No acute distress.  Patient appears well-groomed.   Head:  Normocephalic/atraumatic Eyes:  Fundi examined but not visualized Neck: supple, no paraspinal tenderness, full range of motion Heart:  Regular rate and rhythm Neurological Exam: alert and oriented.  Speech fluent and not dysarthric, able to name, repeat and follow commands.  CN II-XII intact. Bulk and tone normal, muscle strength 5/5 throughout.  Sensation to light touch intact.  Deep tendon reflexes 2+ throughout.  Finger to nose testing intact.  Needs to use upper extremities to push up to stand.  Gait wide-based and cautious, Romberg with mild sway. ***   Juliene Dunnings, DO  CC: Rosina Senters, FNP

## 2024-07-24 ENCOUNTER — Ambulatory Visit: Admitting: Neurology

## 2024-07-24 ENCOUNTER — Encounter: Payer: Self-pay | Admitting: Neurology

## 2024-07-24 DIAGNOSIS — Z029 Encounter for administrative examinations, unspecified: Secondary | ICD-10-CM

## 2024-08-26 ENCOUNTER — Telehealth: Payer: Self-pay | Admitting: Neurology

## 2024-08-26 NOTE — Telephone Encounter (Signed)
 Pt's daughter called in this morning and she stated that her mother is having real bad headaches. Pt has a schedule appt, to come in on 12-01-24 at 3:50pm , Pt is on waiting list as well. Please call. Thanks

## 2024-08-26 NOTE — Telephone Encounter (Signed)
 Dr.Jaffe sees this patient for TIA's. If she wants to see Dr.Jaffe for headaches this will be a new issue that needs to be evaluated.   Or she can call and see her PCP since Dr.Jaffe is out of the office this weeks.

## 2024-09-01 NOTE — Telephone Encounter (Signed)
 agree

## 2024-09-05 ENCOUNTER — Other Ambulatory Visit: Payer: Self-pay | Admitting: Nurse Practitioner

## 2024-09-05 ENCOUNTER — Ambulatory Visit
Admission: RE | Admit: 2024-09-05 | Discharge: 2024-09-05 | Disposition: A | Discharge status: Home / Self Care | Source: Ambulatory Visit | Attending: Nurse Practitioner | Admitting: Nurse Practitioner

## 2024-09-05 DIAGNOSIS — R0602 Shortness of breath: Secondary | ICD-10-CM

## 2024-09-05 DIAGNOSIS — R053 Chronic cough: Secondary | ICD-10-CM

## 2024-09-06 ENCOUNTER — Other Ambulatory Visit (HOSPITAL_BASED_OUTPATIENT_CLINIC_OR_DEPARTMENT_OTHER): Payer: Self-pay | Admitting: Nurse Practitioner

## 2024-09-06 DIAGNOSIS — Z780 Asymptomatic menopausal state: Secondary | ICD-10-CM

## 2024-09-06 DIAGNOSIS — E2839 Other primary ovarian failure: Secondary | ICD-10-CM

## 2024-09-08 ENCOUNTER — Other Ambulatory Visit

## 2024-09-12 ENCOUNTER — Ambulatory Visit
Admission: RE | Admit: 2024-09-12 | Discharge: 2024-09-12 | Disposition: A | Discharge status: Home / Self Care | Source: Ambulatory Visit | Attending: Nurse Practitioner | Admitting: Nurse Practitioner

## 2024-09-12 DIAGNOSIS — R0602 Shortness of breath: Secondary | ICD-10-CM

## 2024-09-12 DIAGNOSIS — R053 Chronic cough: Secondary | ICD-10-CM

## 2024-09-15 ENCOUNTER — Emergency Department (HOSPITAL_BASED_OUTPATIENT_CLINIC_OR_DEPARTMENT_OTHER)

## 2024-09-15 ENCOUNTER — Emergency Department (HOSPITAL_BASED_OUTPATIENT_CLINIC_OR_DEPARTMENT_OTHER)
Admission: EM | Admit: 2024-09-15 | Discharge: 2024-09-15 | Disposition: A | Discharge status: Home / Self Care | Attending: Emergency Medicine | Admitting: Emergency Medicine

## 2024-09-15 ENCOUNTER — Other Ambulatory Visit: Payer: Self-pay

## 2024-09-15 DIAGNOSIS — Z79899 Other long term (current) drug therapy: Secondary | ICD-10-CM | POA: Insufficient documentation

## 2024-09-15 DIAGNOSIS — I2510 Atherosclerotic heart disease of native coronary artery without angina pectoris: Secondary | ICD-10-CM | POA: Diagnosis not present

## 2024-09-15 DIAGNOSIS — Z91040 Latex allergy status: Secondary | ICD-10-CM | POA: Insufficient documentation

## 2024-09-15 DIAGNOSIS — R0602 Shortness of breath: Secondary | ICD-10-CM | POA: Diagnosis present

## 2024-09-15 DIAGNOSIS — J810 Acute pulmonary edema: Secondary | ICD-10-CM | POA: Insufficient documentation

## 2024-09-15 LAB — BASIC METABOLIC PANEL WITH GFR
Anion gap: 11 (ref 5–15)
BUN: 16 mg/dL (ref 8–23)
CO2: 25 mmol/L (ref 22–32)
Calcium: 9.3 mg/dL (ref 8.9–10.3)
Chloride: 106 mmol/L (ref 98–111)
Creatinine, Ser: 1.13 mg/dL — ABNORMAL HIGH (ref 0.44–1.00)
GFR, Estimated: 48 mL/min — ABNORMAL LOW (ref >60)
Glucose, Bld: 151 mg/dL — ABNORMAL HIGH (ref 70–99)
Potassium: 3.6 mmol/L (ref 3.5–5.1)
Sodium: 142 mmol/L (ref 135–145)

## 2024-09-15 LAB — HEPATIC FUNCTION PANEL
ALT: 47 U/L — ABNORMAL HIGH (ref 0–44)
AST: 40 U/L (ref 15–41)
Albumin: 3.7 g/dL (ref 3.5–5.0)
Alkaline Phosphatase: 47 U/L (ref 38–126)
Bilirubin, Direct: 0.5 mg/dL — ABNORMAL HIGH (ref 0.0–0.2)
Indirect Bilirubin: 0.8 mg/dL (ref 0.3–0.9)
Total Bilirubin: 1.3 mg/dL — ABNORMAL HIGH (ref 0.0–1.2)
Total Protein: 6.6 g/dL (ref 6.5–8.1)

## 2024-09-15 LAB — URINALYSIS, ROUTINE W REFLEX MICROSCOPIC
Bilirubin Urine: NEGATIVE
Glucose, UA: NEGATIVE mg/dL
Hgb urine dipstick: NEGATIVE
Ketones, ur: NEGATIVE mg/dL
Leukocytes,Ua: NEGATIVE
Nitrite: NEGATIVE
Protein, ur: NEGATIVE mg/dL
Specific Gravity, Urine: 1.01 (ref 1.005–1.030)
pH: 5.5 (ref 5.0–8.0)

## 2024-09-15 LAB — CBC
HCT: 36.8 % (ref 36.0–46.0)
Hemoglobin: 12.3 g/dL (ref 12.0–15.0)
MCH: 29.1 pg (ref 26.0–34.0)
MCHC: 33.4 g/dL (ref 30.0–36.0)
MCV: 87.2 fL (ref 80.0–100.0)
Platelets: 193 K/uL (ref 150–400)
RBC: 4.22 MIL/uL (ref 3.87–5.11)
RDW: 13.2 % (ref 11.5–15.5)
WBC: 10 K/uL (ref 4.0–10.5)
nRBC: 0 % (ref 0.0–0.2)

## 2024-09-15 LAB — PRO BRAIN NATRIURETIC PEPTIDE: Pro Brain Natriuretic Peptide: 4418 pg/mL — ABNORMAL HIGH (ref <300.0)

## 2024-09-15 LAB — TROPONIN T, HIGH SENSITIVITY
Troponin T High Sensitivity: 38 ng/L — ABNORMAL HIGH (ref 0–19)
Troponin T High Sensitivity: 38 ng/L — ABNORMAL HIGH (ref 0–19)

## 2024-09-15 MED ORDER — FUROSEMIDE 10 MG/ML IJ SOLN
40.0000 mg | Freq: Once | INTRAMUSCULAR | Status: AC
  Administered 2024-09-15: 40 mg via INTRAVENOUS
  Filled 2024-09-15: qty 4

## 2024-09-15 MED ORDER — FUROSEMIDE 20 MG PO TABS: 20.0000 mg | ORAL_TABLET | Freq: Every day | ORAL | 0 refills | Status: DC

## 2024-09-15 NOTE — ED Provider Notes (Signed)
 [place] EMERGENCY DEPARTMENT AT [facility] Provider Note   CSN: [id number] Arrival date & time: 09/15/24  0146     Patient presents with: Shortness of Breath   [name] is a 83 y.o. female.  {Add pertinent medical, surgical, social history, OB history to HPI:32947} The patient is an elderly female who presents with a chief complaint of persistent cough, difficulty sleeping, and bilateral leg swelling. The symptoms began approximately three to three and a half weeks ago. The cough and inability to sleep have progressively worsened, necessitating the use of a recliner for rest due to discomfort when lying flat. The patient reports significant swelling in both feet, to the extent that she is unable to wear shoes. She denies any previous episodes of similar symptoms. The patient has a significant cardiac history, including the placement of a heart stent approximately six to eight months ago following a transient ischemic attack (TIA). She denies any history of smoking or alcohol use. The patient has been on antibiotics and prednisone  for the cough, which she suspects may have contributed to her insomnia. She denies any current chest pain but reports experiencing it overnight when lying flat. She has no history of asthma, COPD, or kidney issues. The patient has not experienced any fever during this period. History was obtained from the patient.     Shortness of Breath      Prior to Admission medications   Medication Sig Start Date End Date Taking? Authorizing Provider  acetaminophen  (TYLENOL ) 325 MG tablet Take 2 tablets (650 mg total) by mouth every 6 (six) hours as needed for mild pain (pain score 1-3) (or Fever >/= 101). 03/08/24   Sebastian Toribio GAILS, MD  bisacodyl  (FLEET) 10 MG/30ML ENEM Place 10 mg rectally as needed (constipation).    [provider]  Blood Pressure KIT 1 Units by Does not apply route daily. Check blood pressure daily for hypertension dx  code I10 09/14/21   Raford Riggs, MD  clopidogrel  (PLAVIX ) 75 MG tablet Take 1 tablet (75 mg total) by mouth daily. Patient not taking: Reported on 07/23/2024 03/09/24 03/09/25  Sebastian Toribio GAILS, MD  Continuous Glucose Receiver (FREESTYLE LIBRE READER) DEVI Use as directed to check blood glucose level 03/28/24   Billy Knee, FNP  Continuous Glucose Sensor (FREESTYLE LIBRE 3 PLUS SENSOR) MISC Change sensor every 15 days. 03/28/24   Billy Knee, FNP  ezetimibe  (ZETIA ) 10 MG tablet Take 1 tablet (10 mg total) by mouth daily. 05/06/24 08/04/24  Meng, Hao, PA  Lancets (ONETOUCH DELICA PLUS [place]) MISC USE TO CHECK BLOOD SUGAR TWICE DAILY 12/24/20   Jarold Medici, MD  Misc. Devices MISC Bedside Commode.M17.12, N39.41, G45.9 03/13/24   Billy Knee, FNP  Misc. Devices Engineer, Civil (consulting). M17.12, N39.41, G45.9 03/13/24   Billy Knee, FNP  nitroGLYCERIN  (NITROSTAT ) 0.4 MG SL tablet Place 1 tablet (0.4 mg total) under the tongue every 5 (five) minutes as needed for chest pain. Patient not taking: Reported on 07/23/2024 04/01/24 06/30/24  Meng, Hao, PA  Olmesartan -amLODIPine -HCTZ 40-10-12.5 MG TABS Take 1 tablet by mouth daily. 03/10/24   Sebastian Toribio GAILS, MD  [facility] VERIO test strip USE TO TEST 2 TIMES DAILY 12/20/20   Jarold Medici, MD  pantoprazole  (PROTONIX ) 40 MG tablet Take 40 mg by mouth daily. 06/02/23   [provider]  polyethylene glycol (MIRALAX  / GLYCOLAX ) 17 g packet Take 17 g by mouth daily as needed for moderate constipation.    [provider]  rosuvastatin  (CRESTOR ) 40  MG tablet Take 1 tablet (40 mg total) by mouth daily. 03/08/24   Sebastian Toribio GAILS, MD  solifenacin  (VESICARE ) 5 MG tablet Take 1 tablet (5 mg total) by mouth daily. 10/04/23   Billy Knee, FNP    Allergies: Bee venom, Latex, Tape, Codeine, Hydrocodone, and Oxycodone     Review of Systems  Respiratory:  Positive for shortness of breath.     Updated Vital Signs BP (!) 145/78 (BP Location: Right Arm)    Pulse 77   Temp 97.8 F (36.6 C)   Resp (!) 22   Ht 5' 9 (1.753 m)   Wt 100.7 kg   SpO2 96%   BMI 32.78 kg/m   Physical Exam Pulmonary:     Effort: Tachypnea present.     Breath sounds: Examination of the right-lower field reveals decreased breath sounds and rales. Examination of the left-lower field reveals decreased breath sounds and rales. Decreased breath sounds and rales present.     Comments: Hypoxia as low as 92% at rest while speaking high is 96% without speaking.    (all labs ordered are listed, but only abnormal results are displayed) Labs Reviewed  BASIC METABOLIC PANEL WITH GFR - Abnormal; Notable for the following components:      Result Value   Glucose, Bld 151 (*)    Creatinine, Ser 1.13 (*)    GFR, Estimated 48 (*)    All other components within normal limits  HEPATIC FUNCTION PANEL - Abnormal; Notable for the following components:   ALT 47 (*)    Total Bilirubin 1.3 (*)    Bilirubin, Direct 0.5 (*)    All other components within normal limits  PRO BRAIN NATRIURETIC PEPTIDE - Abnormal; Notable for the following components:   Pro Brain Natriuretic Peptide 4,418.0 (*)    All other components within normal limits  TROPONIN T, HIGH SENSITIVITY - Abnormal; Notable for the following components:   Troponin T High Sensitivity 38 (*)    All other components within normal limits  TROPONIN T, HIGH SENSITIVITY - Abnormal; Notable for the following components:   Troponin T High Sensitivity 38 (*)    All other components within normal limits  CBC  URINALYSIS, ROUTINE W REFLEX MICROSCOPIC    EKG: None  Radiology: [facility] Chest Port 1 View Result Date: 09/15/2024 EXAM: 1 VIEW(S) XRAY OF THE CHEST 09/15/2024 03:02:00 AM COMPARISON: Comparison made to 09/05/2024. CLINICAL HISTORY: Shortness of breath. FINDINGS: LUNGS AND PLEURA: Stable eventration of the right hemidiaphragm. Pulmonary vascularity is normal. Numerous radiopacities overlying the left hemithorax are likely  artifactual and overlying the patient. No focal pulmonary opacity. No pleural effusion. No pneumothorax. HEART AND MEDIASTINUM: Mild cardiomegaly. No acute abnormality of the mediastinal silhouette. BONES AND SOFT TISSUES: No acute osseous abnormality. IMPRESSION: 1. Stable eventration of the right hemidiaphragm. 2. Mild cardiomegaly. Electronically signed by: Dorethia Molt MD 09/15/2024 03:16 AM EST RP Workstation: [id number]    {Document cardiac monitor, telemetry assessment procedure when appropriate:32947} Procedures   Medications Ordered in the ED  furosemide  (LASIX ) injection 40 mg (40 mg Intravenous Given 09/15/24 0326)    Clinical Course as of 09/15/24 0519  Mon Sep 15, 2024  0518 Initial Evaluation:  - The patient presents with a persistent cough, difficulty sleeping, significant bilateral leg swelling, and fluid in the lungs.   Plan:  - Initiate diuretic therapy - Repeat chest X-ray - Monitor response to treatment in the emergency department - Consider further evaluation for pulmonary embolism if necessary - Review blood pressure  records and address as needed   [JM]    Clinical Course User Index [JM] Daron Breeding, Selinda, MD   {Click here for ABCD2, HEART and other calculators REFRESH Note before signing:1}                              Medical Decision Making Amount and/or Complexity of Data Reviewed Labs: ordered. Radiology: ordered.  Risk Prescription drug management.  ***  {Document critical care time when appropriate  Document review of labs and clinical decision tools ie CHADS2VASC2, etc  Document your independent review of radiology images and any outside records  Document your discussion with family members, caretakers and with consultants  Document social determinants of health affecting pt's care  Document your decision making why or why not admission, treatments were needed:32947:::1}   Final diagnoses:  None    ED Discharge Orders     None

## 2024-09-15 NOTE — ED Triage Notes (Signed)
 Pt to triage c/o SOB and cough with +3 edema bilateral lower extremity x 1 week. Pt states she can't lay flat to sleep due to difficulty breathing. Pt 96% on Room air. RT to triage for assessment. VSS  NAD PT denies CP

## 2024-09-15 NOTE — ED Notes (Signed)
 Pt ambulated in dept on room air, HR 87-89, SATS 95-98%. No distress noted.

## 2024-09-20 ENCOUNTER — Other Ambulatory Visit: Payer: Self-pay

## 2024-09-20 ENCOUNTER — Encounter (HOSPITAL_BASED_OUTPATIENT_CLINIC_OR_DEPARTMENT_OTHER): Payer: Self-pay | Admitting: Emergency Medicine

## 2024-09-20 ENCOUNTER — Emergency Department (HOSPITAL_BASED_OUTPATIENT_CLINIC_OR_DEPARTMENT_OTHER)

## 2024-09-20 ENCOUNTER — Emergency Department (HOSPITAL_BASED_OUTPATIENT_CLINIC_OR_DEPARTMENT_OTHER)
Admission: EM | Admit: 2024-09-20 | Discharge: 2024-09-20 | Disposition: A | Discharge status: Home / Self Care | Attending: Emergency Medicine | Admitting: Emergency Medicine

## 2024-09-20 DIAGNOSIS — R197 Diarrhea, unspecified: Secondary | ICD-10-CM | POA: Insufficient documentation

## 2024-09-20 DIAGNOSIS — K8020 Calculus of gallbladder without cholecystitis without obstruction: Secondary | ICD-10-CM | POA: Diagnosis not present

## 2024-09-20 DIAGNOSIS — K753 Granulomatous hepatitis, not elsewhere classified: Secondary | ICD-10-CM | POA: Diagnosis not present

## 2024-09-20 DIAGNOSIS — Z79899 Other long term (current) drug therapy: Secondary | ICD-10-CM | POA: Diagnosis not present

## 2024-09-20 DIAGNOSIS — K439 Ventral hernia without obstruction or gangrene: Secondary | ICD-10-CM | POA: Diagnosis not present

## 2024-09-20 DIAGNOSIS — J811 Chronic pulmonary edema: Secondary | ICD-10-CM | POA: Diagnosis not present

## 2024-09-20 DIAGNOSIS — I10 Essential (primary) hypertension: Secondary | ICD-10-CM | POA: Insufficient documentation

## 2024-09-20 DIAGNOSIS — Z8673 Personal history of transient ischemic attack (TIA), and cerebral infarction without residual deficits: Secondary | ICD-10-CM | POA: Diagnosis not present

## 2024-09-20 DIAGNOSIS — R0601 Orthopnea: Secondary | ICD-10-CM | POA: Diagnosis not present

## 2024-09-20 DIAGNOSIS — I2510 Atherosclerotic heart disease of native coronary artery without angina pectoris: Secondary | ICD-10-CM | POA: Diagnosis not present

## 2024-09-20 DIAGNOSIS — I700 Atherosclerosis of aorta: Secondary | ICD-10-CM | POA: Diagnosis not present

## 2024-09-20 DIAGNOSIS — Z91040 Latex allergy status: Secondary | ICD-10-CM | POA: Insufficient documentation

## 2024-09-20 DIAGNOSIS — E119 Type 2 diabetes mellitus without complications: Secondary | ICD-10-CM | POA: Insufficient documentation

## 2024-09-20 DIAGNOSIS — M810 Age-related osteoporosis without current pathological fracture: Secondary | ICD-10-CM | POA: Insufficient documentation

## 2024-09-20 DIAGNOSIS — I358 Other nonrheumatic aortic valve disorders: Secondary | ICD-10-CM | POA: Diagnosis not present

## 2024-09-20 DIAGNOSIS — R1012 Left upper quadrant pain: Secondary | ICD-10-CM | POA: Insufficient documentation

## 2024-09-20 DIAGNOSIS — R1110 Vomiting, unspecified: Secondary | ICD-10-CM | POA: Diagnosis present

## 2024-09-20 DIAGNOSIS — K5730 Diverticulosis of large intestine without perforation or abscess without bleeding: Secondary | ICD-10-CM | POA: Insufficient documentation

## 2024-09-20 DIAGNOSIS — J90 Pleural effusion, not elsewhere classified: Secondary | ICD-10-CM | POA: Insufficient documentation

## 2024-09-20 DIAGNOSIS — K760 Fatty (change of) liver, not elsewhere classified: Secondary | ICD-10-CM | POA: Diagnosis not present

## 2024-09-20 LAB — COMPREHENSIVE METABOLIC PANEL WITH GFR
ALT: 30 U/L (ref 0–44)
AST: 34 U/L (ref 15–41)
Albumin: 3.7 g/dL (ref 3.5–5.0)
Alkaline Phosphatase: 42 U/L (ref 38–126)
Anion gap: 11 (ref 5–15)
BUN: 12 mg/dL (ref 8–23)
CO2: 26 mmol/L (ref 22–32)
Calcium: 9.5 mg/dL (ref 8.9–10.3)
Chloride: 106 mmol/L (ref 98–111)
Creatinine, Ser: 0.95 mg/dL (ref 0.44–1.00)
GFR, Estimated: 60 mL/min — ABNORMAL LOW (ref >60)
Glucose, Bld: 141 mg/dL — ABNORMAL HIGH (ref 70–99)
Potassium: 3.5 mmol/L (ref 3.5–5.1)
Sodium: 143 mmol/L (ref 135–145)
Total Bilirubin: 1.6 mg/dL — ABNORMAL HIGH (ref 0.0–1.2)
Total Protein: 6.5 g/dL (ref 6.5–8.1)

## 2024-09-20 LAB — CBC WITH DIFFERENTIAL/PLATELET
Abs Immature Granulocytes: 0.02 K/uL (ref 0.00–0.07)
Basophils Absolute: 0 K/uL (ref 0.0–0.1)
Basophils Relative: 1 %
Eosinophils Absolute: 0.2 K/uL (ref 0.0–0.5)
Eosinophils Relative: 2 %
HCT: 37.8 % (ref 36.0–46.0)
Hemoglobin: 12.4 g/dL (ref 12.0–15.0)
Immature Granulocytes: 0 %
Lymphocytes Relative: 19 %
Lymphs Abs: 1.7 K/uL (ref 0.7–4.0)
MCH: 28.5 pg (ref 26.0–34.0)
MCHC: 32.8 g/dL (ref 30.0–36.0)
MCV: 86.9 fL (ref 80.0–100.0)
Monocytes Absolute: 0.7 K/uL (ref 0.1–1.0)
Monocytes Relative: 8 %
Neutro Abs: 6.2 K/uL (ref 1.7–7.7)
Neutrophils Relative %: 70 %
Platelets: 173 K/uL (ref 150–400)
RBC: 4.35 MIL/uL (ref 3.87–5.11)
RDW: 13 % (ref 11.5–15.5)
WBC: 8.8 K/uL (ref 4.0–10.5)
nRBC: 0 % (ref 0.0–0.2)

## 2024-09-20 LAB — TROPONIN T, HIGH SENSITIVITY
Troponin T High Sensitivity: 32 ng/L — ABNORMAL HIGH (ref 0–19)
Troponin T High Sensitivity: 29 ng/L — ABNORMAL HIGH (ref 0–19)

## 2024-09-20 LAB — RESP PANEL BY RT-PCR (RSV, FLU A&B, COVID)  RVPGX2
Influenza A by PCR: NEGATIVE
Influenza B by PCR: NEGATIVE
Resp Syncytial Virus by PCR: NEGATIVE
SARS Coronavirus 2 by RT PCR: NEGATIVE

## 2024-09-20 LAB — PRO BRAIN NATRIURETIC PEPTIDE: Pro Brain Natriuretic Peptide: 3345 pg/mL — ABNORMAL HIGH (ref <300.0)

## 2024-09-20 MED ORDER — FUROSEMIDE 40 MG PO TABS: 40.0000 mg | ORAL_TABLET | Freq: Every day | ORAL | 0 refills | Status: DC

## 2024-09-20 MED ORDER — LOPERAMIDE HCL 2 MG PO CAPS
4.0000 mg | ORAL_CAPSULE | Freq: Once | ORAL | Status: AC
  Administered 2024-09-20: 4 mg via ORAL
  Filled 2024-09-20: qty 2

## 2024-09-20 MED ORDER — ONDANSETRON HCL 4 MG/2ML IJ SOLN
4.0000 mg | Freq: Once | INTRAMUSCULAR | Status: AC
  Administered 2024-09-20: 4 mg via INTRAVENOUS
  Filled 2024-09-20: qty 2

## 2024-09-20 MED ORDER — IOHEXOL 300 MG/ML  SOLN
100.0000 mL | Freq: Once | INTRAMUSCULAR | Status: AC | PRN
  Administered 2024-09-20: 100 mL via INTRAVENOUS

## 2024-09-20 MED ORDER — LOPERAMIDE HCL 2 MG PO CAPS: 2.0000 mg | ORAL_CAPSULE | Freq: Four times a day (QID) | ORAL | 0 refills | Status: AC | PRN

## 2024-09-20 MED ORDER — SODIUM CHLORIDE 0.9 % IV BOLUS: 1000.0000 mL | Freq: Once | INTRAVENOUS | Status: DC

## 2024-09-20 NOTE — Discharge Instructions (Signed)
 While you were here in the emergency room, you had imaging done that showed that the fluid on your lung is improving.  I would like you to begin taking 40 mg of Lasix  total each morning.  I have also sent your prescription for some Imodium  which is a medicine that can help with diarrhea.  On Monday, I would like you to call your primary care doctor for follow-up appointment.  You can tell them while you are in the emergency room, you do CT scan that showed an area on your kidney that requires further imaging.  They will be able to see you and order this testing.  I would also like you to call your cardiologist on Monday morning for follow-up appointment.  Return to the emergency department if you have worsening pain in your abdomen, or unable to eat or drink, feel weak or fatigued, or develop worsening shortness of breath, or pain in your chest

## 2024-09-20 NOTE — ED Triage Notes (Signed)
 Pt arrives pov, endorses LUQ pain for a few days, diarrhea starting last night after eating. Denies fever.

## 2024-09-20 NOTE — ED Notes (Signed)
 Patient transported to CT

## 2024-09-20 NOTE — ED Provider Notes (Signed)
 [place] [facility]ERGENC[facility] DEPARTMENT AT [facility] Provider Note  CSN: [id number] Arrival date & time: 09/20/24 9194  Chief Complaint(s) Diarrhea  HPI [name] is a 83 y.o. female who is here today with vomiting, diarrhea, left upper quadrant pain that began last night after eating.  Patient was recently seen for pulmonary edema, discharged on Lasix .  She states that has improved the swelling in her legs but she still is having orthopnea.  Denies fever or chills.   Past Medical History Past Medical History:  Diagnosis Date   Abdominal bloating 02/17/2019   Abdominal pain    AKI (acute kidney injury) 02/12/2023   Arthritis    cervical disc degeneration/ oa left knee, carpal tunnel rt wrist, adhesive capsulitis right shoulder, rt hand weakness; lumbar degeneration   Carpal tunnel syndrome    CARPAL TUNNEL SYNDROME, RIGHT 10/14/2010   Qualifier: Diagnosis of   By: Eyvonne MD, Debby LELON        CHEST PAIN, ATYPICAL 07/01/2010   Qualifier: Diagnosis of   By: Domenica MD, Harlene Cahill acquired pneumonia 05/06/2020   Complication of anesthesia 2006-at [facility]   breathing problems-no BP med given prior to surgery;  hx of being very sleepy after colon surgery --  states no problems with last right total knee replacement 2013   Constipation    Diabetes mellitus without complication (HCC)    borderline - diet control   Diverticulitis hx of   Diverticulosis    E. coli infection    2020   Frequent UTI    hx of urethral injury during colon surgery - states frequent uti's since   GERD (gastroesophageal reflux disease)    Gout, unspecified 09/09/2007   Qualifier: Diagnosis of   By: Mavis MD, Norleen BRAVO        H. pylori infection 07/18/2010   H/O hiatal hernia    HIP PAIN, LEFT, CHRONIC 11/09/2010   Qualifier: Diagnosis of   By: Mavis MD, Norleen BRAVO        History of colon polyps 08/26/2008   History of palpitations    in the past   History of shingles    has a  lingering itching on back where shingles were   Hyperlipidemia    Hypertension    Hypokalemia 10/19/2022   KNEE PAIN, RIGHT, CHRONIC 10/14/2010   Qualifier: Diagnosis of   By: Eyvonne MD, Debby LELON        Lightheaded 02/17/2019   Morbidly obese (HCC) 05/31/2011   bmi of 44     Numbness and tingling in right hand    pt. 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   Shortness of breath 09/14/2021   Shortness of breath 09/14/2021   Spasm of lumbar paraspinous muscle 06/17/2011   Stroke (HCC)    Stroke-like symptom    Subacute intracranial hemorrhage (HCC) 10/18/2022   Transient speech disturbance 04/18/2022   Urinary frequency 02/17/2019   Vaginal discharge 08/02/2011   Vitamin D  deficiency    Patient Active Problem List   Diagnosis Date Noted   PVC's (premature ventricular contractions) 03/06/2024   TIA (transient  ischemic attack) 03/06/2024   Symptomatic stenosis of left carotid artery 03/06/2024   Acute cystitis 03/03/2024   Elevated troponin 03/03/2024   Type 2 diabetes mellitus with hyperglycemia (HCC) 03/03/2024   Mild protein malnutrition 03/03/2024   Transaminitis 03/03/2024   Pseudohyponatremia 03/03/2024   Hyperbilirubinemia 03/03/2024   Class 2 obesity 03/03/2024   Hypoglycemia 06/23/2023   History of TIA (transient ischemic attack) 02/12/2023   OSA on CPAP 10/19/2022   Urge incontinence 08/23/2022   Chronic kidney disease, stage 3a (HCC) 07/17/2021   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   Aortic atherosclerosis 01/03/2021   Class 2 severe obesity due to excess calories with serious comorbidity and body mass index (BMI) of 39.0 to 39.9 in adult 01/03/2021   Vitamin D  deficiency  06/01/2020   History of arthroplasty of right knee 05/11/2020   Constipation 06/26/2018   Type 2 diabetes mellitus with other specified complication (HCC) 06/19/2014   OA (osteoarthritis) of knee 01/05/2012   LBBB (left bundle branch block) 07/26/2010   Gastroesophageal reflux disease without esophagitis 10/06/2008   History of colonic polyps 08/26/2008   Hyperlipidemia associated with type 2 diabetes mellitus (HCC) 06/03/2007   Essential hypertension 06/03/2007   Home Medication(s) Prior to Admission medications   Medication Sig Start Date End Date Taking? Authorizing Provider  acetaminophen  (TYLENOL ) 325 MG tablet Take 2 tablets (650 mg total) by mouth every 6 (six) hours as needed for mild pain (pain score 1-3) (or Fever >/= 101). 03/08/24   Sebastian Toribio GAILS, MD  bisacodyl  (FLEET) 10 MG/30ML ENEM Place 10 mg rectally as needed (constipation).    [provider]  Blood Pressure KIT 1 Units by Does not apply route daily. Check blood pressure daily for hypertension dx code I10 09/14/21   Raford Riggs, MD  clopidogrel  (PLAVIX ) 75 MG tablet Take 1 tablet (75 mg total) by mouth daily. Patient not taking: Reported on 07/23/2024 03/09/24 03/09/25  Sebastian Toribio GAILS, MD  Continuous Glucose Receiver (FREESTYLE LIBRE READER) DEVI Use as directed to check blood glucose level 03/28/24   Billy Knee, FNP  Continuous Glucose Sensor (FREESTYLE LIBRE 3 PLUS SENSOR) MISC Change sensor every 15 days. 03/28/24   Billy Knee, FNP  ezetimibe  (ZETIA ) 10 MG tablet Take 1 tablet (10 mg total) by mouth daily. 05/06/24 08/04/24  Meng, Hao, PA  furosemide  (LASIX ) 20 MG tablet Take 1 tablet (20 mg total) by mouth daily. 09/15/24   Mesner, Selinda, MD  Lancets [facility] DELICA PLUS [place]) MISC USE TO CHECK BLOOD SUGAR TWICE DAILY 12/24/20   Jarold Medici, MD  Misc. Devices MISC Bedside Commode.M17.12, N39.41, G45.9 03/13/24   Billy Knee, FNP  Misc. Devices Engineer, Civil (consulting). M17.12, N39.41, G45.9  03/13/24   Billy Knee, FNP  nitroGLYCERIN  (NITROSTAT ) 0.4 MG SL tablet Place 1 tablet (0.4 mg total) under the tongue every 5 (five) minutes as needed for chest pain. Patient not taking: Reported on 07/23/2024 04/01/24 06/30/24  Meng, Hao, PA  Olmesartan -amLODIPine -HCTZ 40-10-12.5 MG TABS Take 1 tablet by mouth daily. 03/10/24   Sebastian Toribio GAILS, MD  [facility] VERIO test strip USE TO TEST 2 TIMES DAILY 12/20/20   Jarold Medici, MD  pantoprazole  (PROTONIX ) 40 MG tablet Take 40 mg by mouth daily. 06/02/23   [provider]  polyethylene glycol (MIRALAX  / GLYCOLAX ) 17 g packet Take 17 g by mouth daily as needed for moderate constipation.    [provider]  rosuvastatin  (CRESTOR ) 40 MG tablet Take 1 tablet (40 mg total) by mouth daily. 03/08/24  Sebastian Toribio GAILS, MD  solifenacin  (VESICARE ) 5 MG tablet Take 1 tablet (5 mg total) by mouth daily. 10/04/23   Billy Knee, FNP                                                                                                                                    Past Surgical History Past Surgical History:  Procedure Laterality Date   ABDOMINAL HYSTERECTOMY     bladder tack     BREAST BIOPSY Left    BREAST SURGERY     breast duct resection- benign   CARPAL TUNNEL RELEASE Right 06/04/2014   Procedure: RIGHT CARPAL TUNNEL RELEASE;  Surgeon: Elsie Mussel, MD;  Location: [facility];  Service: Orthopedics;  Laterality: Right;   COLON RESECTION  2008   hx diverticulosis   COLON SURGERY     COLONOSCOPY     colonoscopy [id number]     JOINT REPLACEMENT     LEFT HEART CATH AND CORONARY ANGIOGRAPHY N/A 04/15/2024   Procedure: LEFT HEART CATH AND CORONARY ANGIOGRAPHY;  Surgeon: Anner Alm ORN, MD;  Location: [facility] INVASIVE CV LAB;  Service: Cardiovascular;  Laterality: N/A;   OOPHORECTOMY     POLYPECTOMY     skin graft left arm - traumatic compression injury left upper arm     temporary ureter stent     TOTAL KNEE ARTHROPLASTY   01/05/2012   Procedure: TOTAL KNEE ARTHROPLASTY;  Surgeon: Dempsey GAILS Moan, MD;  Location: [facility];  Service: Orthopedics;  Laterality: Right;   TOTAL KNEE ARTHROPLASTY Left 08/17/2014   Procedure: LEFT TOTAL KNEE ARTHROPLASTY;  Surgeon: Dempsey Moan GAILS, MD;  Location: [facility];  Service: Orthopedics;  Laterality: Left;   TRANSCAROTID ARTERY REVASCULARIZATION  Left 03/07/2024   Procedure: TRANSCAROTID ARTERY REVASCULARIZATION (TCAR);  Surgeon: Serene Gaile ORN, MD;  Location: [facility] OR;  Service: Vascular;  Laterality: Left;   ureter repair for tyransected left ureter     Family History Family History  Problem Relation Age of Onset   Breast cancer Mother 47   Hypertension Mother    Prostate cancer Father    Hypertension Father    Dementia Sister    Lung cancer Brother    Hypertension Brother    COPD Brother    Cancer Maternal Grandmother    Arthritis Sister    Hypertension Sister    Arthritis Sister    Hypertension Child    Hypertension Child    Hypertension Child    Colon polyps Neg Hx    Esophageal cancer Neg Hx    Rectal cancer Neg Hx    Stomach cancer Neg Hx    Colon cancer Neg Hx     Social History Social History   Tobacco Use   Smoking status: Never   Smokeless tobacco: Never  Vaping Use   Vaping status: Never Used  Substance Use Topics   Alcohol use: No   Drug use: No  Allergies Bee venom, Latex, Tape, Codeine, Hydrocodone, and Oxycodone   Review of Systems Review of Systems  Physical Exam Vital Signs  I have reviewed the triage vital signs BP (!) 152/116   Pulse 87   Temp 98.3 F (36.8 C)   Resp 20   Wt 101.6 kg   SpO2 95%   BMI 33.08 kg/m   Physical Exam Vitals and nursing note reviewed.  Constitutional:      Appearance: She is not toxic-appearing.  HENT:     Head: Normocephalic.  Eyes:     Pupils: Pupils are equal, round, and reactive to light.  Cardiovascular:     Rate and Rhythm: Normal rate.  Pulmonary:     Effort: Pulmonary effort is  normal.  Abdominal:     General: Abdomen is flat. There is no distension.     Palpations: Abdomen is soft.     Tenderness: There is no abdominal tenderness. There is no guarding.  Musculoskeletal:        General: Normal range of motion.  Skin:    General: Skin is warm.  Neurological:     Mental Status: She is alert.     ED Results and Treatments Labs (all labs ordered are listed, but only abnormal results are displayed) Labs Reviewed  COMPREHENSIVE METABOLIC PANEL WITH GFR - Abnormal; Notable for the following components:      Result Value   Glucose, Bld 141 (*)    Total Bilirubin 1.6 (*)    GFR, Estimated 60 (*)    All other components within normal limits  PRO BRAIN NATRIURETIC PEPTIDE - Abnormal; Notable for the following components:   Pro Brain Natriuretic Peptide 3,345.0 (*)    All other components within normal limits  TROPONIN T, HIGH SENSITIVITY - Abnormal; Notable for the following components:   Troponin T High Sensitivity 32 (*)    All other components within normal limits  TROPONIN T, HIGH SENSITIVITY - Abnormal; Notable for the following components:   Troponin T High Sensitivity 29 (*)    All other components within normal limits  RESP PANEL BY RT-PCR (RSV, FLU A&B, COVID)  RVPGX2  CBC WITH DIFFERENTIAL/PLATELET                                                                                                                          Radiology CT CHEST ABDOMEN PELVIS W CONTRAST Result Date: 09/20/2024 CLINICAL DATA:  Sepsis EXAM: CT CHEST, ABDOMEN, AND PELVIS WITH CONTRAST TECHNIQUE: Multidetector CT imaging of the chest, abdomen and pelvis was performed following the standard protocol during bolus administration of intravenous contrast. RADIATION DOSE REDUCTION: This exam was performed according to the departmental dose-optimization program which includes automated exposure control, adjustment of the mA and/or kV according to patient size and/or use of iterative  reconstruction technique. CONTRAST:  OMNIPAQUE  IOHEXOL  300 MG/ML  SOLN COMPARISON:  September 12, 2024, January 13, 2023, march twenty-eighth 2023 FINDINGS: Evaluation is limited by motion. CT CHEST FINDINGS  Cardiovascular: Heart is the upper limits of normal in size. Dense aortic valve calcifications. Atherosclerotic calcifications of the nonaneurysmal thoracic aorta. Three-vessel coronary artery atherosclerotic calcifications. No pericardial effusion. Mediastinum/Nodes: Visualized thyroid  is unremarkable. No axillary or mediastinal adenopathy. Lungs/Pleura: There is mild interlobular septal thickening, suboptimally assessed given degree of respiratory motion. No significant pneumothorax. Small RIGHT pleural effusion. Evaluation for pulmonary nodules is limited secondary to extensive respiratory motion. Musculoskeletal: Degenerative changes of the thoracic spine. CT ABDOMEN PELVIS FINDINGS Hepatobiliary: Diffuse low-attenuation of the liver. Multiple cholelithiasis. No ancillary evidence of acute cholecystitis. No extrahepatic biliary ductal dilation. Calcified granuloma of the posterior RIGHT liver. Pancreas: Unremarkable. No pancreatic ductal dilatation or surrounding inflammatory changes. Spleen: Normal in size without focal abnormality. Adrenals/Urinary Tract: Adrenal glands are unremarkable. No hydronephrosis or obstructing nephrolithiasis. Along the RIGHT medial kidney, there is an indeterminate mass which measures 19 mm, previously 12 mm in 2023 (series 2, image 60). Subcentimeter hypodense lesions are too small to accurately characterize. Bladder is unremarkable. Stomach/Bowel: No evidence of bowel obstruction. Diverticulosis most predominant in the distal colon. Appendix is normal. Stomach is decompressed. Vascular/Lymphatic: Severe atherosclerotic calcifications of the nonaneurysmal abdominal aorta. Severe atherosclerotic calcifications of the origin of the SMA. Reproductive: Status post hysterectomy.  No adnexal masses. Other: Along the inferior and medial margin of the RIGHT kidney, there is a oval mass which measures 24 x 17 by 25 mm. It demonstrates Hounsfield unit greater than simple fluid and demonstrates possible progressive peripheral enhancement on delayed images. This is new since 2024. There are multiple fat containing ventral hernias as well as discrete rounded areas of fat along the peritoneal lining superficially which may reflect small lipomas or postsurgical change. Musculoskeletal: Degenerative changes of the lower lumbar spine with intervertebral disc space height loss and severe facet arthropathy. IMPRESSION: 1. There is a 25 mm oval mass along the inferior and medial margin of the RIGHT kidney. It demonstrates Hounsfield unit above simple fluid and demonstrates possible progressive peripheral enhancement on delayed images. This is new since 2024. Differential considerations include lymphadenopathy, neoplasm or loculated inflamed collection. Recommend further evaluation with dedicated renal protocol CT or MRI. 2. There is an indeterminate 19 mm mass along the RIGHT medial kidney, previously 12 mm in 2023. This could be further assessed with dedicated renal protocol CT or MRI. 3. There is mild interlobular septal thickening, suboptimally assessed given degree of respiratory motion. This could reflect mild pulmonary edema versus atypical infection. 4. Small RIGHT pleural effusion. 5. Hepatic steatosis. 6. Cholelithiasis without ancillary evidence of acute cholecystitis. Aortic Atherosclerosis (ICD10-I70.0). Electronically Signed   By: Corean Salter M.D.   On: 09/20/2024 10:43    Pertinent labs & imaging results that were available during my care of the patient were reviewed by me and considered in my medical decision making (see MDM for details).  Medications Ordered in ED Medications  ondansetron  (ZOFRAN ) injection 4 mg (4 mg Intravenous Given 09/20/24 1020)  loperamide  (IMODIUM )  capsule 4 mg (4 mg Oral Given 09/20/24 1020)  iohexol  (OMNIPAQUE ) 300 MG/ML solution 100 mL (100 mLs Intravenous Contrast Given 09/20/24 1008)  Procedures Procedures  (including critical care time)  Medical Decision Making / ED Course   This patient presents to the ED for concern of abdominal pain, nausea, vomiting, diarrhea, orthopnea, this involves an extensive number of treatment options, and is a complaint that carries with it a high risk of complications and morbidity.  The differential diagnosis includes enteritis, gastroenteritis, intra-abdominal infection, consider obstruction, CHF, pulmonary edema, consider pneumonia.  MDM: On exam, patient uncomfortable appearing.  Her abdomen is soft, no specific tenderness, however with the patient's frequent diarrhea and nausea, she is understandably in a state of discomfort.  She also endorses continued orthopnea.  Regarding the GI symptoms, will check blood work and obtain imaging.  I have provided Zofran .  Regarding the orthopnea, will obtain imaging of the chest, BNP and troponin, EKG ordered.  She does have improvement in her lower extremity edema, and is saturating 96% on room air.  Reassessment 12 PM-patient states she is feeling quite a bit better.  Has not had any diarrhea since here in the ED.  CT imaging of the chest abdomen pelvis shows some mild pulmonary edema, trace effusion.  Patient was able to lie down and sleep here in the ED.  Regarding the orthopnea, I do think doubling the patient's Lasix  for the next few days is reasonable.  Patient's imaging did show 2 areas in the right kidney of concern.  I discussed this with the patient, and she will follow-up with her PCP for dedicated imaging.  Will discharge patient.  Husband at bedside and is agreeable with plan as well.   Additional history  obtained: -Additional history obtained from husband at bedside -External records from outside source obtained and reviewed including: Chart review including previous notes, labs, imaging, consultation notes   Lab Tests: -I ordered, reviewed, and interpreted labs.   The pertinent results include:   Labs Reviewed  COMPREHENSIVE METABOLIC PANEL WITH GFR - Abnormal; Notable for the following components:      Result Value   Glucose, Bld 141 (*)    Total Bilirubin 1.6 (*)    GFR, Estimated 60 (*)    All other components within normal limits  PRO BRAIN NATRIURETIC PEPTIDE - Abnormal; Notable for the following components:   Pro Brain Natriuretic Peptide 3,345.0 (*)    All other components within normal limits  TROPONIN T, HIGH SENSITIVITY - Abnormal; Notable for the following components:   Troponin T High Sensitivity 32 (*)    All other components within normal limits  TROPONIN T, HIGH SENSITIVITY - Abnormal; Notable for the following components:   Troponin T High Sensitivity 29 (*)    All other components within normal limits  RESP PANEL BY RT-PCR (RSV, FLU A&B, COVID)  RVPGX2  CBC WITH DIFFERENTIAL/PLATELET      EKG sinus rhythm, occasional PVCs, no acute ischemia  EKG Interpretation Date/Time:  Saturday September 20 2024 09:06:11 EST Ventricular Rate:  75 PR Interval:  149 QRS Duration:  154 QT Interval:  453 QTC Calculation: 506 R Axis:   119  Text Interpretation: Sinus rhythm Multiple ventricular premature complexes Nonspecific intraventricular conduction delay Abnormal T, consider ischemia, lateral leads Confirmed by Mannie Pac [phone]) on 09/20/2024 12:05:00 PM         Imaging Studies ordered: I ordered imaging studies including CT chest abdomen pelvis I independently visualized and interpreted imaging. I agree with the radiologist interpretation   Medicines ordered and prescription drug management: Meds ordered this encounter  Medications   DISCONTD: sodium  chloride  0.9 % bolus 1,000 mL   ondansetron  (ZOFRAN ) injection 4 mg   loperamide  (IMODIUM ) capsule 4 mg   iohexol  (OMNIPAQUE ) 300 MG/ML solution 100 mL    -I have reviewed the patients home medicines and have made adjustments as needed  Cardiac Monitoring: The patient was maintained on a cardiac monitor.  I personally viewed and interpreted the cardiac monitored which showed an underlying rhythm of: Normal sinus rhythm  Social Determinants of Health:  Factors impacting patients care include: Lack of access to primary care   Reevaluation: After the interventions noted above, I reevaluated the patient and found that they have :improved  Co morbidities that complicate the patient evaluation  Past Medical History:  Diagnosis Date   Abdominal bloating 02/17/2019   Abdominal pain    AKI (acute kidney injury) 02/12/2023   Arthritis    cervical disc degeneration/ oa left knee, carpal tunnel rt wrist, adhesive capsulitis right shoulder, rt hand weakness; lumbar degeneration   Carpal tunnel syndrome    CARPAL TUNNEL SYNDROME, RIGHT 10/14/2010   Qualifier: Diagnosis of   By: Eyvonne MD, Debby ORN        CHEST PAIN, ATYPICAL 07/01/2010   Qualifier: Diagnosis of   By: Domenica MD, Harlene Cahill acquired pneumonia 05/06/2020   Complication of anesthesia 2006-at [facility]   breathing problems-no BP med given prior to surgery;  hx of being very sleepy after colon surgery --  states no problems with last right total knee replacement 2013   Constipation    Diabetes mellitus without complication (HCC)    borderline - diet control   Diverticulitis hx of   Diverticulosis    E. coli infection    2020   Frequent UTI    hx of urethral injury during colon surgery - states frequent uti's since   GERD (gastroesophageal reflux disease)    Gout, unspecified 09/09/2007   Qualifier: Diagnosis of   By: Mavis MD, Norleen BRAVO        H. pylori infection 07/18/2010   H/O hiatal hernia    HIP PAIN, LEFT,  CHRONIC 11/09/2010   Qualifier: Diagnosis of   By: Mavis MD, Norleen BRAVO        History of colon polyps 08/26/2008   History of palpitations    in the past   History of shingles    has a lingering itching on back where shingles were   Hyperlipidemia    Hypertension    Hypokalemia 10/19/2022   KNEE PAIN, RIGHT, CHRONIC 10/14/2010   Qualifier: Diagnosis of   By: Eyvonne MD, Debby ORN        Lightheaded 02/17/2019   Morbidly obese (HCC) 05/31/2011   bmi of 44     Numbness and tingling in right hand    pt. 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   Shortness of breath 09/14/2021   Shortness of breath 09/14/2021   Spasm of lumbar paraspinous muscle 06/17/2011   Stroke (HCC)    Stroke-like symptom    Subacute intracranial hemorrhage (HCC) 10/18/2022   Transient speech disturbance 04/18/2022   Urinary frequency 02/17/2019   Vaginal  discharge 08/02/2011   Vitamin D  deficiency       Dispostion: I considered admission for this patient, however the patient clinically improved, has conditions which can be managed on outpatient basis and she feels comfortable with discharge     Final Clinical Impression(s) / ED Diagnoses Final diagnoses:  Chronic pulmonary edema     @PCDICTATION @    Mannie Pac T, DO 09/20/24 1209

## 2024-09-20 NOTE — ED Notes (Signed)
 I was unable to obtain IV access at time of blood draw. My C.N. has started a line and pt. Has just gone to CT.

## 2024-09-22 ENCOUNTER — Ambulatory Visit

## 2024-09-23 ENCOUNTER — Encounter: Payer: Self-pay | Admitting: Internal Medicine

## 2024-09-30 NOTE — Progress Notes (Unsigned)
 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  ADDENDUM:  Correction - once cleared by vascular surgery, may discontinue Plavix  and continue ASA 81mg  daily monotherapy. Normotensive blood pressure - follow up with PCP Rosuvastatin  40mg  daily.  LDL goal less than 70.  Recheck lipid panel Glycemic control.  Hgb A1c goal less than 70 Follow up with vascular surgery as scheduled Mediterranean diet.   Follow up 4 months   Total time spent in chart, reviewing imaging and tests and face to face with patient: 49 minutes     Subjective:  [name] is an 83 year old female with HTN, DM II, CKD and history of TIA who follows up for headaches.  History supplemented by hospital records.  She is accompanied by her husband in person and daughter via phone.  UPDATE: Current medications:  ASA 81mg , Plavix  75mg , hydralazine , olmesartan -amlodipine -HCTZ, glipizide, rosuvastatin  40mg    ***   HISTORY: On 10/13/2022, she began experiencing acute onset of new headaches, described as severe paroxysmal right occipital-temporal stabbing headaches lasting a few seconds and occurring about every 10 minutes.  No associated nausea, vomiting, new visual disturbance, or unilateral numbness or weakness.  She took her blood pressure which was in the 200s systolic.  She went to the ED on 12/18 but left before being seen.  Headaches continued, so she returned to a different ED at [facility] on 12/20 where CT head revealed a 1.3 cm hyperdense focus within  the right occipital lobe suspicious for hemorrhage.  CTA of head revealed no LVO or other vascular abnormality.  CTV of head revealed no cerebral venous sinus thrombosis.  She was transferred to [facility] for continued workup and management.  MRI of brain revealed early subacute hemorrage within the right occipital lobe.  She was already on ASA 81mg , which was put on hold.  LDL 98.  Hgb A1c 6.4.  She was admitted and monitored.  Repeat CT Head on 12/22 revealed hemorrhage was unchanged.  She was discharged in stable condition.  She is still not on ASA. Reports that she has trouble seeing, worse since the hospital but ongoing prior to the hospitalization.  She followed up with her eye doctor and exam is stable.    She began having a recurrence of headache in 2024.  On 02/11/2023, she started experiencing intermittent left arm and facial numbness.  She was admitted to [facility] where labs revealed AKI with BUN 37 and Cr 1.70.  CT head revealed no acute bleed or ischemic stroke.  MRI of brain revealed chronic small vessel ischemic changes and multiple scattered chronic microhemorrhages but no acute stroke.  MRA of head revealed no LVO or hemodynamically significant stenosis, aneurysm or other abnormality.  Carotid Doppler revealed no hemodynamically significant stenosis.  LDL was 198 and Hgb A1c 6.1.  She apparently was not taking the statin.  She was discharged on DAPT for 3 weeks and started on rosuvastatin  40mg .  On 03/04/2024, patient was admitted for urosepsis presenting with fever, dysuria, some confusion and generalized weakness, when she had sudden onset aphasia where she was unable to answer  questions, perseverating and not following commands.  She had bilateral lower extremity weakness but no focal or unilateral weakness.  She did not receive TNK due to prior intracranial hemorrhage.  MRI of brain revealed no acute bleed or ischemic stroke.  CTA head and neck revealed moderate calcific  70% left carotid stenosis.  Underwent elective left carotid TCAR.  2D echo revealed EF 50-55% with calcified aortic valve.  LDL was 90 and Hgb A1c was 7.7.  Discharged on ASA and Plavix  with followup with vascular surgery.  Since discharge, she reports trouble remembering things.  Notes some lightheadedness and generalized weakness.  Cannot walk for prolonged period of time.     Imaging: 10/18/2022 CT HEAD WO:  1.3 cm hyperdense focus within the right occipital lobe. This may reflect an acute parenchymal hemorrhage. However, a brain MRI without and with contrast is recommended to exclude a primary mass or metastatic lesion.  Moderate chronic small ischemic changes within the cerebral white matter. 10/18/2022 CTA/CTV HEAD:  1. No intracranial large vessel occlusion. No evidence of vascular malformation.  2. 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  3. Moderate stenosis in the right mid V4 segment.  4. No evidence of venous sinus thrombosis or stenosis. The right transverse and sigmoid sinus are hypoplastic, which appears similar to the 04/18/2022 CTA head. 10/18/2022 MRI BRAIN W WO:  1. Early subacute hemorrhage in the right occipital lobe, with a volume of approximately 1 mL. Surrounding edema without significant mass effect or midline shift. No evidence of active hemorrhage.  2. Multiple foci of hemosiderin deposition, most prominently in the parietal and occipital lobes; with the exception of the new subacute hemorrhage, the others appear unchanged compared to 04/17/2022.  While these could be the sequela of hypertensive microhemorrhages, early cerebral amyloid angiopathy or multiple cavernomas could appear similar. 10/20/2022 CT HEAD WO:  Unchanged hemorrhage in the right occipital lobe, further characterized on recent MRI. 02/12/2023 MRI BRAIN WO:  1. No acute intracranial infarct or other abnormality. 2. Moderately advanced chronic  microvascular ischemic disease for age. 3. Multiple scattered chronic micro hemorrhages, most pronounced about the parieto-occipital regions bilaterally. Findings are favored to be hypertensive in nature. 02/12/2023 MRA HEAD:  1. Negative intracranial MRA for large vessel occlusion. 2. Mild intracranial atheromatous disease, but no hemodynamically significant or correctable stenosis. 02/12/2023 CAROTID US :  Right Carotid: Velocities in the right ICA are consistent with a 1-39% stenosis.  Left Carotid: Velocities in the left ICA are consistent with a 1-39% stenosis.  Vertebrals: Bilateral vertebral arteries demonstrate antegrade flow.  Subclavians: Normal flow hemodynamics were seen in bilateral subclavian  03/04/2024 CTA HEAD & NECK:  Atherosclerotic disease at both carotid bifurcations with 70% stenosis of both proximal internal carotid arteries.  Atherosclerotic disease in the carotid siphon regions with severe stenosis estimated at 30-50% on both sides.  Atherosclerotic disease in the right V4 segment with stenosis estimated at 50%. 03/04/2024 MRI BRAIN WO:  No acute infarction or acute hemorrhage.  Small remote hemorrhage in the left posterior temoral-occipital region. Moderate T2/FLAIR hyperintensities the white matter, nonspecific but compatible with chronic microvascular ischemic disease.   PAST MEDICAL HISTORY: Past Medical History:  Diagnosis Date   Abdominal bloating 02/17/2019   Abdominal pain    AKI (acute kidney injury) 02/12/2023   Arthritis    cervical disc degeneration/ oa left knee, carpal tunnel rt wrist, adhesive capsulitis right shoulder, rt hand weakness; lumbar degeneration   Carpal tunnel  syndrome    CARPAL TUNNEL SYNDROME, RIGHT 10/14/2010   Qualifier: Diagnosis of   By: Eyvonne MD, Debby ORN        CHEST PAIN, ATYPICAL 07/01/2010   Qualifier: Diagnosis of   By: Domenica MD, Harlene Cahill acquired pneumonia 05/06/2020   Complication of anesthesia 2006-at [facility]    breathing problems-no BP med given prior to surgery;  hx of being very sleepy after colon surgery --  states no problems with last right total knee replacement 2013   Constipation    Diabetes mellitus without complication (HCC)    borderline - diet control   Diverticulitis hx of   Diverticulosis    E. coli infection    2020   Frequent UTI    hx of urethral injury during colon surgery - states frequent uti's since   GERD (gastroesophageal reflux disease)    Gout, unspecified 09/09/2007   Qualifier: Diagnosis of   By: Mavis MD, Norleen BRAVO        H. pylori infection 07/18/2010   H/O hiatal hernia    HIP PAIN, LEFT, CHRONIC 11/09/2010   Qualifier: Diagnosis of   By: Mavis MD, Norleen BRAVO        History of colon polyps 08/26/2008   History of palpitations    in the past   History of shingles    has a lingering itching on back where shingles were   Hyperlipidemia    Hypertension    Hypokalemia 10/19/2022   KNEE PAIN, RIGHT, CHRONIC 10/14/2010   Qualifier: Diagnosis of   By: Eyvonne MD, Debby ORN        Lightheaded 02/17/2019   Morbidly obese (HCC) 05/31/2011   bmi of 44     Numbness and tingling in right hand    pt. 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     bisacodyl  (FLEET) 10 MG/30ML ENEM Place 10 mg rectally as needed (constipation).     Blood Pressure KIT 1 Units by Does not apply route daily. Check blood pressure daily for hypertension dx code I10 1 kit 0   clopidogrel  (PLAVIX ) 75 MG tablet Take 1 tablet (75 mg total) by mouth daily. (Patient not taking: Reported on 07/23/2024) 90 tablet 1   Continuous Glucose Receiver (FREESTYLE LIBRE READER) DEVI Use as directed to check blood glucose level 1 each 0   Continuous Glucose Sensor (FREESTYLE LIBRE 3 PLUS SENSOR) MISC Change sensor every 15 days. 2 each 12   ezetimibe  (ZETIA ) 10  MG tablet Take 1 tablet (10 mg total) by mouth daily. 90 tablet 3   furosemide  (LASIX ) 40 MG tablet Take 1 tablet (40 mg total) by mouth daily for 14 days. 14 tablet 0   Lancets (ONETOUCH DELICA PLUS LANCET33G) MISC USE TO CHECK BLOOD SUGAR TWICE DAILY 100 each 3   loperamide  (IMODIUM ) 2 MG capsule Take 1 capsule (2 mg total) by mouth 4 (four) times daily as needed for diarrhea or loose stools. 12 capsule 0   Misc. Devices MISC Bedside Commode.M17.12, N39.41, G45.9 1 each 0   Misc. Devices Engineer, Civil (consulting). M17.12, N39.41, G45.9 1 each 0   nitroGLYCERIN  (NITROSTAT ) 0.4 MG SL tablet Place 1 tablet (0.4 mg total) under the tongue every 5 (five) minutes as needed for chest pain. (Patient not taking: Reported on 07/23/2024) 25 tablet 3   Olmesartan -amLODIPine -HCTZ 40-10-12.5 MG TABS Take 1 tablet by mouth daily.     ONETOUCH VERIO test strip USE TO TEST 2 TIMES DAILY 100 strip 3   pantoprazole  (PROTONIX ) 40 MG tablet Take 40 mg by mouth daily.     polyethylene glycol (MIRALAX  / GLYCOLAX ) 17 g packet Take 17 g by mouth daily as needed for moderate constipation.     rosuvastatin  (CRESTOR ) 40 MG tablet Take 1 tablet (40 mg total) by mouth daily. 90 tablet 1   solifenacin   (VESICARE ) 5 MG tablet Take 1 tablet (5 mg total) by mouth daily. 90 tablet 1   No current facility-administered medications on file prior to visit.    ALLERGIES: Allergies  Allergen Reactions   Bee Venom Hives   Latex Hives and Itching   Tape     Burns skin, tolerates paper tape   Codeine Other (See Comments)    Hallucinations    Hydrocodone Other (See Comments)    hallucinations    Oxycodone  Other (See Comments)    hallucinations    FAMILY HISTORY: Family History  Problem Relation Age of Onset   Breast cancer Mother 71   Hypertension Mother    Prostate cancer Father    Hypertension Father    Dementia Sister    Lung cancer Brother    Hypertension Brother    COPD Brother    Cancer Maternal Grandmother    Arthritis Sister    Hypertension Sister    Arthritis Sister    Hypertension Child    Hypertension Child    Hypertension Child    Colon polyps Neg Hx    Esophageal cancer Neg Hx    Rectal cancer Neg Hx    Stomach cancer Neg Hx    Colon cancer Neg Hx       Objective:  *** General: No acute distress.  Patient appears well-groomed.   Head:  Normocephalic/atraumatic Eyes:  Fundi examined but not visualized Neck: supple, no paraspinal tenderness, full range of motion Heart:  Regular rate and rhythm Neurological Exam: alert and oriented.  Speech fluent and not dysarthric, able to name, repeat and follow commands.  CN II-XII intact. Bulk and tone normal, muscle strength 5/5 throughout.  Sensation to light touch intact.  Deep tendon reflexes 2+ throughout.  Finger to nose testing intact.  Needs to use upper extremities to push up to stand.  Gait wide-based and cautious, Romberg with mild sway. ***   Juliene Dunnings, DO  CC: Rosina Senters, FNP

## 2024-10-01 ENCOUNTER — Encounter: Payer: Self-pay | Admitting: Neurology

## 2024-10-01 ENCOUNTER — Ambulatory Visit: Admitting: Neurology

## 2024-10-01 VITALS — BP 137/82 | HR 88 | Ht 66.0 in

## 2024-10-01 DIAGNOSIS — I10 Essential (primary) hypertension: Secondary | ICD-10-CM | POA: Diagnosis not present

## 2024-10-01 DIAGNOSIS — E119 Type 2 diabetes mellitus without complications: Secondary | ICD-10-CM

## 2024-10-01 DIAGNOSIS — N1831 Chronic kidney disease, stage 3a: Secondary | ICD-10-CM

## 2024-10-01 DIAGNOSIS — E785 Hyperlipidemia, unspecified: Secondary | ICD-10-CM

## 2024-10-01 DIAGNOSIS — G459 Transient cerebral ischemic attack, unspecified: Secondary | ICD-10-CM

## 2024-10-01 DIAGNOSIS — Z8673 Personal history of transient ischemic attack (TIA), and cerebral infarction without residual deficits: Secondary | ICD-10-CM | POA: Diagnosis not present

## 2024-10-01 DIAGNOSIS — I6522 Occlusion and stenosis of left carotid artery: Secondary | ICD-10-CM

## 2024-10-01 NOTE — Patient Instructions (Addendum)
 Contact cardiology to schedule appointment with Dr. Vina Gull. [facility] at [facility] [address] [place], [place] [address] [phone]  Follow up 6 months.

## 2024-10-08 ENCOUNTER — Telehealth: Payer: Self-pay | Admitting: Internal Medicine

## 2024-10-08 ENCOUNTER — Encounter (HOSPITAL_BASED_OUTPATIENT_CLINIC_OR_DEPARTMENT_OTHER): Payer: Self-pay | Admitting: Emergency Medicine

## 2024-10-08 ENCOUNTER — Other Ambulatory Visit: Payer: Self-pay

## 2024-10-08 ENCOUNTER — Emergency Department (HOSPITAL_BASED_OUTPATIENT_CLINIC_OR_DEPARTMENT_OTHER): Admitting: Radiology

## 2024-10-08 ENCOUNTER — Emergency Department (HOSPITAL_BASED_OUTPATIENT_CLINIC_OR_DEPARTMENT_OTHER)
Admission: EM | Admit: 2024-10-08 | Discharge: 2024-10-08 | Disposition: A | Discharge status: Home / Self Care | Source: Ambulatory Visit | Attending: Emergency Medicine | Admitting: Emergency Medicine

## 2024-10-08 DIAGNOSIS — Z79899 Other long term (current) drug therapy: Secondary | ICD-10-CM | POA: Insufficient documentation

## 2024-10-08 DIAGNOSIS — Z7982 Long term (current) use of aspirin: Secondary | ICD-10-CM | POA: Insufficient documentation

## 2024-10-08 DIAGNOSIS — E119 Type 2 diabetes mellitus without complications: Secondary | ICD-10-CM | POA: Insufficient documentation

## 2024-10-08 DIAGNOSIS — R600 Localized edema: Secondary | ICD-10-CM | POA: Insufficient documentation

## 2024-10-08 DIAGNOSIS — I509 Heart failure, unspecified: Secondary | ICD-10-CM | POA: Insufficient documentation

## 2024-10-08 DIAGNOSIS — N183 Chronic kidney disease, stage 3 unspecified: Secondary | ICD-10-CM | POA: Insufficient documentation

## 2024-10-08 DIAGNOSIS — Z91040 Latex allergy status: Secondary | ICD-10-CM | POA: Insufficient documentation

## 2024-10-08 DIAGNOSIS — I130 Hypertensive heart and chronic kidney disease with heart failure and stage 1 through stage 4 chronic kidney disease, or unspecified chronic kidney disease: Secondary | ICD-10-CM | POA: Insufficient documentation

## 2024-10-08 LAB — CBC
HCT: 39.4 % (ref 36.0–46.0)
Hemoglobin: 12.9 g/dL (ref 12.0–15.0)
MCH: 28.3 pg (ref 26.0–34.0)
MCHC: 32.7 g/dL (ref 30.0–36.0)
MCV: 86.4 fL (ref 80.0–100.0)
Platelets: 214 K/uL (ref 150–400)
RBC: 4.56 MIL/uL (ref 3.87–5.11)
RDW: 13.2 % (ref 11.5–15.5)
WBC: 7.6 K/uL (ref 4.0–10.5)
nRBC: 0 % (ref 0.0–0.2)

## 2024-10-08 LAB — BASIC METABOLIC PANEL WITH GFR
Anion gap: 12 (ref 5–15)
BUN: 16 mg/dL (ref 8–23)
CO2: 27 mmol/L (ref 22–32)
Calcium: 10.1 mg/dL (ref 8.9–10.3)
Chloride: 101 mmol/L (ref 98–111)
Creatinine, Ser: 1.15 mg/dL — ABNORMAL HIGH (ref 0.44–1.00)
GFR, Estimated: 47 mL/min — ABNORMAL LOW (ref >60)
Glucose, Bld: 119 mg/dL — ABNORMAL HIGH (ref 70–99)
Potassium: 4.2 mmol/L (ref 3.5–5.1)
Sodium: 141 mmol/L (ref 135–145)

## 2024-10-08 LAB — TROPONIN T, HIGH SENSITIVITY
Troponin T High Sensitivity: 28 ng/L — ABNORMAL HIGH (ref 0–19)
Troponin T High Sensitivity: 29 ng/L — ABNORMAL HIGH (ref 0–19)

## 2024-10-08 LAB — PRO BRAIN NATRIURETIC PEPTIDE: Pro Brain Natriuretic Peptide: 2050 pg/mL — ABNORMAL HIGH (ref <300.0)

## 2024-10-08 MED ORDER — FUROSEMIDE 20 MG PO TABS: ORAL_TABLET | ORAL | 0 refills | Status: DC

## 2024-10-08 MED ORDER — FUROSEMIDE 10 MG/ML IJ SOLN
40.0000 mg | Freq: Once | INTRAMUSCULAR | Status: AC
  Administered 2024-10-08: 40 mg via INTRAVENOUS
  Filled 2024-10-08: qty 4

## 2024-10-08 NOTE — ED Notes (Signed)
 Patient transported to X-ray

## 2024-10-08 NOTE — Discharge Instructions (Addendum)
 Go to the [facility] Emergency department if any increase in shortness of breath.

## 2024-10-08 NOTE — ED Triage Notes (Signed)
 Pt endorses shob, hx of same. Also reports CP

## 2024-10-08 NOTE — Telephone Encounter (Signed)
 Pt c/o Shortness Of Breath: STAT if SOB developed within the last 24 hours or pt is noticeably SOB on the phone  1. Are you currently SOB (can you hear that pt is SOB on the phone)? yes  2. How long have you been experiencing SOB? Since last night  3. Are you SOB when sitting or when up moving around? both  4. Are you currently experiencing any other symptoms? palps

## 2024-10-08 NOTE — ED Notes (Signed)
 Commode set up at bedside. Patient reminded to still call for safety when getting up. Call bell in reach.

## 2024-10-08 NOTE — ED Notes (Signed)
 Reviewed dc instructions with pt.  Pt states she is already taking lasix  40mg  daily.  EDP made aware.  Pt does state that she has been confused because there have been several doctors adjusting her meds.  Pt will f/u with the referral to cardiology and will continue lasix  daily until seen by clinic.

## 2024-10-08 NOTE — ED Notes (Signed)
 Patient transported to CT

## 2024-10-08 NOTE — ED Provider Notes (Signed)
 [place] EMERGENCY DEPARTMENT AT [facility] Provider Note   CSN: [id number] Arrival date & time: 10/08/24  1021     Patient presents with: Shortness of Breath   [name] is a 83 y.o. female.   Patient complains of swelling in her legs.  Patient reports she has been experiencing some shortness of breath.  Patient reports that she called her cardiologist who advised her to come to the emergency department.  Patient reports she was seen here 11/22 and received IV Lasix .  Patient reports that she felt much better after she was given the IV Lasix .  Patient reports that she has not had any nausea or vomiting she denies any diarrhea she has not had any fever or chills.  Patient is not having any chest pain.  Patient is here with family who is supportive.  Patient reports that she has a past medical history of having had a TIA.  Past medical history hypertension left bundle branch block PVCs TIA carotid stenosis, type 2 diabetes hyperlipidemia, Osteoarthritis chronic kidney disease stage III   Shortness of Breath      Prior to Admission medications   Medication Sig Start Date End Date Taking? Authorizing Provider  furosemide  (LASIX ) 20 MG tablet One tablet a day as needed for swelling 10/08/24  Yes Adjoa Althouse K, PA-C  furosemide  (LASIX ) 40 MG tablet Take 40 mg by mouth daily.   Yes [provider]  acetaminophen  (TYLENOL ) 325 MG tablet Take 2 tablets (650 mg total) by mouth every 6 (six) hours as needed for mild pain (pain score 1-3) (or Fever >/= 101). 03/08/24   Sebastian Toribio GAILS, MD  amLODipine -olmesartan  (AZOR) 10-40 MG tablet Take 1 tablet by mouth every morning. 09/23/24   [provider]  aspirin  EC 81 MG tablet Take 81 mg by mouth daily. Swallow whole.    [provider]  bisacodyl  (FLEET) 10 MG/30ML ENEM Place 10 mg rectally as needed (constipation).    [provider]  Blood Pressure KIT 1 Units by Does not apply route daily.  Check blood pressure daily for hypertension dx code I10 09/14/21   Raford Riggs, MD  clopidogrel  (PLAVIX ) 75 MG tablet Take 1 tablet (75 mg total) by mouth daily. Patient not taking: Reported on 07/23/2024 03/09/24 03/09/25  Sebastian Toribio GAILS, MD  Continuous Glucose Receiver (FREESTYLE LIBRE READER) DEVI Use as directed to check blood glucose level 03/28/24   Billy Knee, FNP  Continuous Glucose Sensor (FREESTYLE LIBRE 3 PLUS SENSOR) MISC Change sensor every 15 days. 03/28/24   Billy Knee, FNP  ezetimibe  (ZETIA ) 10 MG tablet Take 1 tablet (10 mg total) by mouth daily. 05/06/24 10/01/24  Meng, Hao, PA  Lancets (ONETOUCH DELICA PLUS [place]) MISC USE TO CHECK BLOOD SUGAR TWICE DAILY 12/24/20   Jarold Medici, MD  loperamide  (IMODIUM ) 2 MG capsule Take 1 capsule (2 mg total) by mouth 4 (four) times daily as needed for diarrhea or loose stools. 09/20/24   Mannie Fairy DASEN, DO  Misc. Devices MISC Bedside Commode.M17.12, N39.41, G45.9 03/13/24   Billy Knee, FNP  Misc. Devices Engineer, Civil (consulting). M17.12, N39.41, G45.9 03/13/24   Billy Knee, FNP  nitroGLYCERIN  (NITROSTAT ) 0.4 MG SL tablet Place 1 tablet (0.4 mg total) under the tongue every 5 (five) minutes as needed for chest pain. Patient not taking: Reported on 07/23/2024 04/01/24 06/30/24  Meng, Hao, PA  [facility] VERIO test strip USE TO TEST 2 TIMES DAILY 12/20/20   Jarold Medici, MD  pantoprazole  (PROTONIX )  40 MG tablet Take 40 mg by mouth daily. 06/02/23   [provider]  polyethylene glycol (MIRALAX  / GLYCOLAX ) 17 g packet Take 17 g by mouth daily as needed for moderate constipation.    [provider]  rosuvastatin  (CRESTOR ) 40 MG tablet Take 1 tablet (40 mg total) by mouth daily. 03/08/24   Sebastian Toribio GAILS, MD  solifenacin  (VESICARE ) 5 MG tablet Take 1 tablet (5 mg total) by mouth daily. Patient not taking: Reported on 10/01/2024 10/04/23   Billy Knee, FNP    Allergies: Bee venom, Latex, Tape, Codeine, Hydrocodone, and  Oxycodone     Review of Systems  Respiratory:  Positive for shortness of breath.   All other systems reviewed and are negative.   Updated Vital Signs BP (!) 145/73   Pulse 93   Temp 97.6 F (36.4 C) (Oral)   Resp (!) 22   Wt 100.7 kg   SpO2 98%   BMI 35.83 kg/m   Physical Exam Vitals and nursing note reviewed.  Constitutional:      Appearance: She is well-developed.  HENT:     Head: Normocephalic.  Cardiovascular:     Rate and Rhythm: Normal rate and regular rhythm.  Pulmonary:     Effort: Pulmonary effort is normal.     Breath sounds: Normal breath sounds. No decreased breath sounds.  Abdominal:     General: There is no distension.  Musculoskeletal:     Cervical back: Normal range of motion.     Right lower leg: Edema present.     Left lower leg: Edema present.  Skin:    General: Skin is warm.  Neurological:     General: No focal deficit present.     Mental Status: She is alert and oriented to person, place, and time.     (all labs ordered are listed, but only abnormal results are displayed) Labs Reviewed  BASIC METABOLIC PANEL WITH GFR - Abnormal; Notable for the following components:      Result Value   Glucose, Bld 119 (*)    Creatinine, Ser 1.15 (*)    GFR, Estimated 47 (*)    All other components within normal limits  PRO BRAIN NATRIURETIC PEPTIDE - Abnormal; Notable for the following components:   Pro Brain Natriuretic Peptide 2,050.0 (*)    All other components within normal limits  TROPONIN T, HIGH SENSITIVITY - Abnormal; Notable for the following components:   Troponin T High Sensitivity 28 (*)    All other components within normal limits  TROPONIN T, HIGH SENSITIVITY - Abnormal; Notable for the following components:   Troponin T High Sensitivity 29 (*)    All other components within normal limits  CBC    EKG: None  Radiology: DG Chest 2 View Result Date: 10/08/2024 CLINICAL DATA:  Chest pain and shortness of breath. EXAM: CHEST - 2 VIEW  COMPARISON:  09/15/2024 and chest CT dated 09/20/2024. FINDINGS: Very poor depth of inspiration. Stable elevated right hemidiaphragm. Suggestion of patchy density throughout the left lower lung zone on the frontal view, not seen on the lateral view. Therefore, this is most likely due to overlying soft tissues on the frontal view. The remainder of the lungs are clear. Grossly stable enlarged cardiac silhouette. Thoracic spine degenerative changes. IMPRESSION: 1. No acute abnormality. 2. Stable cardiomegaly. 3. Stable elevated right hemidiaphragm. Electronically Signed   By: Elspeth Bathe M.D.   On: 10/08/2024 12:22     Procedures   Medications Ordered in the ED  furosemide  (LASIX ) injection 40 mg (40 mg Intravenous Given 10/08/24 1242)                                    Medical Decision Making Patient complains of swelling in her extremities and some shortness of breath.  Amount and/or Complexity of Data Reviewed External Data Reviewed: notes.    Details: Cardiology notes reviewed Labs: ordered. Decision-making details documented in ED Course.    Details: Labs ordered reviewed and interpreted.  Troponin is 28 and repeat is 29.  This is at patient's baseline.  BNP is 2040 Radiology: ordered.    Details: Chest x-ray shows no acute abnormality ECG/medicine tests: ordered and independent interpretation performed. Decision-making details documented in ED Course.    Details: EKG sinus rhythm no acute findings  Risk Prescription drug management. Risk Details: Patient given Lasix  40 mg IV.  Patient has not taken Lasix  today.  Patient had a large amount of urine output, patient reports feeling much better.  Patient is counseled on congestive heart failure.  She is advised to follow-up with Dr. Okey.  Patient is advised if her symptoms return or worsen she should go to [facility] emergency department for further evaluation.  Patient is discharged much improved.        Final diagnoses:   Congestive heart failure, unspecified HF chronicity, unspecified heart failure type [facility])    ED Discharge Orders          Ordered    furosemide  (LASIX ) 20 MG tablet        10/08/24 1718    Ambulatory referral to Cardiology       Comments: If you have not heard from the Cardiology office within the next 72 hours please call [phone].   10/08/24 1719            An After Visit Summary was printed and given to the patient.    Flint Sonny POUR, PA-C 10/08/24 1804    Tonia Chew, MD 10/13/24 [phone]

## 2024-10-08 NOTE — Telephone Encounter (Signed)
 Pt c/o Shortness Of Breath: STAT if SOB developed within the last 24 hours or pt is noticeably SOB on the phone   1. Are you currently SOB (can you hear that pt is SOB on the phone)? yes   2. How long have you been experiencing SOB? Since last night   3. Are you SOB when sitting or when up moving around? both   4. Are you currently experiencing any other symptoms? palps    I received a call from this patient and her husband. She stated that she is having active SOB since last night, palpitations, chest pain that is radiating to her left arm, and swelling in her legs. She stated that last week she also went to the ER due to SOB and they were able to get some fluid off of her which helped but she currently feeling the same again. They stated she is taking a fluid pill daily but I was not able to locate that in the patient's chart. I advise them to go back to the ER to be seen immediately for all of these symptoms and that I will let Dr. Okey know of this. They were appreciative of the call and stated they will be heading to the ER now.

## 2024-10-09 NOTE — Telephone Encounter (Signed)
 Spoke with patient's husband [name] (OK per DPR). Offered appt with Dr. Okey for 10/17/24 at 11:40 AM, [name] accepted and requested to be placed on waitlist for any cancellations.  Appt placed on waitlist. [name] verbalized understanding and expressed appreciation for call.

## 2024-10-16 NOTE — Progress Notes (Unsigned)
 Cardiology Office Note   Date:  10/16/2024   ID:  [name], DOB January 15, 1941, MRN [id number]  PCP:  Arloa Jarvis, NP  Cardiologist:   Vina Gull, MD       History of Present Illness: [name] is a 83 y.o. female with a history of HTN, HL, CVA, CAD (coronary calcifications on CT), LBBB, ICH    2018  Myoview   Normal perfusion  June 2023 Echo LVEF 50 to 55%  Severe MAC  Mild AS Dec 2023  ICH April 2024  Echo  No change from 2023  -May 2025  I saw the pt in consult for elevated troponin.   (294, 224)  Echo showed LVEF 50 to 55% Infeiror hypokinesis(old)  Mod AS May 2025  CCTA   Ca score 1965   50% LM; 50 to 70% prox and mid RCA, 25 to 49% prox/mid/distal LAD, prox LCx, Distal RCA, D2, D3   AV calcium  score 1619  Severe MAC FFR positive in LM and mid RCA    -June 2025   LHC showed 50% mLAD; 505 pRCA and mid RCA   LVEDP normal         Active Medications[1]   Allergies:   Bee venom, Latex, Tape, Codeine, Hydrocodone, and Oxycodone    Past Medical History:  Diagnosis Date   Abdominal bloating 02/17/2019   Abdominal pain    AKI (acute kidney injury) 02/12/2023   Arthritis    cervical disc degeneration/ oa left knee, carpal tunnel rt wrist, adhesive capsulitis right shoulder, rt hand weakness; lumbar degeneration   Carpal tunnel syndrome    CARPAL TUNNEL SYNDROME, RIGHT 10/14/2010   Qualifier: Diagnosis of   By: Eyvonne MD, Debby LELON        CHEST PAIN, ATYPICAL 07/01/2010   Qualifier: Diagnosis of   By: Domenica MD, Harlene Cahill acquired pneumonia 05/06/2020   Complication of anesthesia 2006-at [facility]   breathing problems-no BP med given prior to surgery;  hx of being very sleepy after colon surgery --  states no problems with last right total knee replacement 2013   Constipation    Diabetes mellitus without complication (HCC)    borderline - diet control   Diverticulitis hx of   Diverticulosis    E. coli infection    2020   Frequent UTI    hx  of urethral injury during colon surgery - states frequent uti's since   GERD (gastroesophageal reflux disease)    Gout, unspecified 09/09/2007   Qualifier: Diagnosis of   By: Mavis MD, Norleen BRAVO        H. pylori infection 07/18/2010   H/O hiatal hernia    HIP PAIN, LEFT, CHRONIC 11/09/2010   Qualifier: Diagnosis of   By: Mavis MD, John E        History of colon polyps 08/26/2008   History of palpitations    in the past   History of shingles    has a lingering itching on back where shingles were   Hyperlipidemia    Hypertension    Hypokalemia 10/19/2022   KNEE PAIN, RIGHT, CHRONIC 10/14/2010   Qualifier: Diagnosis of   By: Eyvonne MD, Debby LELON        Lightheaded 02/17/2019   Morbidly obese (HCC) 05/31/2011   bmi of 45     Numbness and tingling in right hand    pt. 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   Shortness of breath 09/14/2021   Shortness of breath 09/14/2021   Spasm of lumbar paraspinous muscle 06/17/2011   Stroke (HCC)    Stroke-like symptom    Subacute intracranial hemorrhage (HCC) 10/18/2022   Transient speech disturbance 04/18/2022   Urinary frequency 02/17/2019   Vaginal discharge 08/02/2011   Vitamin D  deficiency     Past Surgical History:  Procedure Laterality Date   ABDOMINAL HYSTERECTOMY     bladder tack     BREAST BIOPSY Left    BREAST SURGERY     breast duct resection- benign   CARPAL TUNNEL RELEASE Right 06/04/2014   Procedure: RIGHT CARPAL TUNNEL RELEASE;  Surgeon: Elsie Mussel, MD;  Location: [facility];  Service: Orthopedics;  Laterality: Right;   COLON RESECTION  2008   hx diverticulosis   COLON SURGERY     COLONOSCOPY     colonoscopy  [id number]     JOINT REPLACEMENT     LEFT HEART CATH AND CORONARY ANGIOGRAPHY N/A 04/15/2024   Procedure: LEFT HEART CATH AND CORONARY ANGIOGRAPHY;  Surgeon: Anner Alm ORN, MD;  Location: [facility] INVASIVE CV LAB;  Service: Cardiovascular;  Laterality: N/A;   OOPHORECTOMY     POLYPECTOMY     skin graft left arm - traumatic compression injury left upper arm     temporary ureter stent     TOTAL KNEE ARTHROPLASTY  01/05/2012   Procedure: TOTAL KNEE ARTHROPLASTY;  Surgeon: Dempsey LULLA Moan, MD;  Location: [facility];  Service: Orthopedics;  Laterality: Right;   TOTAL KNEE ARTHROPLASTY Left 08/17/2014   Procedure: LEFT TOTAL KNEE ARTHROPLASTY;  Surgeon: Dempsey Moan LULLA, MD;  Location: [facility];  Service: Orthopedics;  Laterality: Left;   TRANSCAROTID ARTERY REVASCULARIZATION  Left 03/07/2024   Procedure: TRANSCAROTID ARTERY REVASCULARIZATION (TCAR);  Surgeon: Serene Gaile ORN, MD;  Location: [facility] OR;  Service: Vascular;  Laterality: Left;   ureter repair for tyransected left ureter       Social History:  The patient  reports that she has never smoked. She has never used smokeless tobacco. She reports that she does not drink alcohol and does not use drugs.   Family History:  The patient's family history includes Arthritis in her sister and sister; Breast cancer (age of onset: 27) in her mother; COPD in her brother; Cancer in her maternal grandmother; Dementia in her sister; Hypertension in her brother, child, child, child, father, mother, and sister; Lung cancer in her brother; Prostate cancer in her father.    ROS:  Please see the history of present illness. All other systems are reviewed and  Negative to the above problem except as noted.    PHYSICAL EXAM: VS:  There were no vitals taken for this visit.  GEN: Well nourished, well developed, in no acute distress  HEENT: normal  Neck: no JVD, carotid bruits, or masses Cardiac: RRR; no murmurs, rubs, or gallops,no edema  Respiratory:  clear to  auscultation bilaterally, normal work of breathing GI: soft, nontender, nondistended, + BS  No hepatomegaly  MS: no deformity Moving all extremities   Skin: warm and dry, no rash Neuro:  Strength and sensation are intact Psych: euthymic mood, full affect   EKG:  EKG is ordered today.  LHC  June 2025    Mid LAD lesion is 50% stenosed.   Prox RCA to Mid RCA lesion is 50% stenosed with 50% stenosed side branch in RV Branch.   LV end diastolic pressure is normal.   There is no aortic valve stenosis.   In the absence of any other complications or medical issues, we expect the patient to be ready for discharge from a cath perspective on 04/15/2024.   Continue aspirin  Plavix  per carotid stent, following planned duration of dual antiplatelet therapy, would reduce to monotherapy based on [facility] (Dr. Serene) recommendations.    CCTA with FFR  May/June 2025 1. CTFFR suggests obstructive CAD in left main and mid RCA, and is borderline in mid LAD. Cardiac catheterization is recommended   IMPRESSION: 1. Coronary calcium  score of 1965. This was 98th percentile for age-, sex, and race-matched controls.  2. Total plaque volume 1864mm3 which is 96th percentile for age- and sex-matched controls (calcified plaque 527mm3; non-calcified plaque 1239mm3). TPV is extensive  3.  Normal coronary origin with right dominance.  4.  Normal coronary arteries.  5.  Mixed plaque in left main causes ~50% stenosis  6.  Moderate (50-69%) stenosis in proximal and mid RCA  7. Mild (25-49%) stenosis in proximal/mid/distal LAD, proximal LCX, distal RCA, D2, and D3  8.  Severe aortic valve calcifications (AV calcium  score 1619)  9.  Severe mitral annular calcifications    Echo  May 2025  1. Left ventricular ejection fraction, by estimation, is 50 to 55%. The  left ventricle has low normal function. The left ventricle has no regional  wall motion abnormalities. There is moderate concentric left ventricular   hypertrophy. Left ventricular  diastolic function could not be evaluated. There is nonspecific abnormal  septal motion.   2. Right ventricular systolic function is normal. The right ventricular  size is normal. There is normal pulmonary artery systolic pressure.   3. The mitral valve is degenerative. Trivial mitral valve regurgitation.  Mild mitral stenosis. Severe mitral annular calcification.   4. The aortic valve is calcified. There is moderate calcification of the  aortic valve. There is moderate thickening of the aortic valve. Aortic  valve regurgitation is not visualized. Moderate aortic valve stenosis.  Aortic valve mean gradient measures  23.0 mmHg.   5. The inferior vena cava is normal in size with greater than 50%  respiratory variability, suggesting right atrial pressure of 3 mmHg.   Comparison(s): Prior images reviewed side by side.   Conclusion(s)/Recommendation(s): LVEF o  Dominance: Right Lipid Panel    Component Value Date/Time   CHOL 168 03/05/2024 0516   CHOL 171 03/01/2022 1108   TRIG 195 (H) 03/05/2024 0516   HDL 39 (L) 03/05/2024 0516   HDL 39 (L) 03/01/2022 1108   CHOLHDL 4.3 03/05/2024 0516   VLDL 39 03/05/2024 0516   LDLCALC 90 03/05/2024 0516   LDLCALC 103 (H) 03/01/2022 1108   LDLDIRECT 162.2 11/21/2013 1135      Wt Readings from Last 3 Encounters:  10/08/24 222 lb (100.7 kg)  09/20/24 224 lb (101.6 kg)  09/15/24 222 lb 0.1 oz (100.7 kg)      ASSESSMENT AND PLAN:     Current medicines are reviewed at length with the patient today.  The patient does not have concerns regarding medicines.  Signed, Vina Gull, MD  10/16/2024 10:10 AM    [facility] [address] [place], [place], [place]  [address] Phone: [phone]; Fax: [phone]       [1]  No outpatient medications have been marked as taking for the 10/17/24 encounter (Appointment) with Okey Vina GAILS, MD.

## 2024-10-17 ENCOUNTER — Ambulatory Visit: Attending: Internal Medicine | Admitting: Internal Medicine

## 2024-10-17 ENCOUNTER — Encounter: Payer: Self-pay | Admitting: Internal Medicine

## 2024-10-17 VITALS — BP 116/70 | HR 71 | Ht 66.0 in | Wt 220.0 lb

## 2024-10-17 DIAGNOSIS — I350 Nonrheumatic aortic (valve) stenosis: Secondary | ICD-10-CM

## 2024-10-17 DIAGNOSIS — R0602 Shortness of breath: Secondary | ICD-10-CM

## 2024-10-17 NOTE — Patient Instructions (Signed)
" ° °  Testing/Procedures:  Your physician has requested that you have an echocardiogram. Echocardiography is a painless test that uses sound waves to create images of your heart. It provides your doctor with information about the size and shape of your heart and how well your hearts chambers and valves are working. This procedure takes approximately one hour. There are no restrictions for this procedure. Please do NOT wear cologne, perfume, aftershave, or lotions (deodorant is allowed). Please arrive 15 minutes prior to your appointment time.  Please note: We ask at that you not bring children with you during ultrasound (echo/ vascular) testing. Due to room size and safety concerns, children are not allowed in the ultrasound rooms during exams. Our front office staff cannot provide observation of children in our lobby area while testing is being conducted. An adult accompanying a patient to their appointment will only be allowed in the ultrasound room at the discretion of the ultrasound technician under special circumstances. We apologize for any inconvenience. [address]  Follow-Up: At [facility], you and your health needs are our priority.  As part of our continuing mission to provide you with exceptional heart care, our providers are all part of one team.  This team includes your primary Cardiologist (physician) and Advanced Practice Providers or APPs (Physician Assistants and Nurse Practitioners) who all work together to provide you with the care you need, when you need it.  Your next appointment:   2-3 month(s)  Provider:   Vina Gull, MD      Other Instructions BRING ALL MEDICATION BOTTLES TO FOLLOW UP APPOINTMENT         "

## 2024-10-18 LAB — COMPREHENSIVE METABOLIC PANEL WITH GFR
ALT: 58 IU/L — AB (ref 0–32)
AST: 78 IU/L — AB (ref 0–40)
Albumin: 4.3 g/dL (ref 3.7–4.7)
Alkaline Phosphatase: 50 IU/L (ref 48–129)
BUN/Creatinine Ratio: 22 (ref 12–28)
BUN: 25 mg/dL (ref 8–27)
Bilirubin Total: 1.3 mg/dL — AB (ref 0.0–1.2)
CO2: 23 mmol/L (ref 20–29)
Calcium: 9.6 mg/dL (ref 8.7–10.3)
Chloride: 100 mmol/L (ref 96–106)
Creatinine, Ser: 1.15 mg/dL — AB (ref 0.57–1.00)
Globulin, Total: 2.8 g/dL (ref 1.5–4.5)
Glucose: 115 mg/dL — AB (ref 70–99)
Potassium: 4.5 mmol/L (ref 3.5–5.2)
Sodium: 140 mmol/L (ref 134–144)
Total Protein: 7.1 g/dL (ref 6.0–8.5)
eGFR: 48 mL/min/1.73 — AB

## 2024-10-18 LAB — CBC
Hematocrit: 43 % (ref 34.0–46.6)
Hemoglobin: 13.7 g/dL (ref 11.1–15.9)
MCH: 28.6 pg (ref 26.6–33.0)
MCHC: 31.9 g/dL (ref 31.5–35.7)
MCV: 90 fL (ref 79–97)
Platelets: 204 x10E3/uL (ref 150–450)
RBC: 4.79 x10E6/uL (ref 3.77–5.28)
RDW: 12.6 % (ref 11.7–15.4)
WBC: 6.8 x10E3/uL (ref 3.4–10.8)

## 2024-10-18 LAB — VITAMIN D 25 HYDROXY (VIT D DEFICIENCY, FRACTURES): Vit D, 25-Hydroxy: 24.9 ng/mL — AB (ref 30.0–100.0)

## 2024-10-18 LAB — MAGNESIUM: Magnesium: 2 mg/dL (ref 1.6–2.3)

## 2024-10-18 LAB — PRO B NATRIURETIC PEPTIDE: NT-Pro BNP: 2134 pg/mL — AB (ref 0–738)

## 2024-10-20 ENCOUNTER — Ambulatory Visit: Payer: Self-pay | Admitting: Internal Medicine

## 2024-10-20 ENCOUNTER — Ambulatory Visit (HOSPITAL_COMMUNITY)
Admission: RE | Admit: 2024-10-20 | Discharge: 2024-10-20 | Disposition: A | Discharge status: Home / Self Care | Source: Ambulatory Visit | Attending: Cardiovascular Disease | Admitting: Cardiovascular Disease

## 2024-10-20 DIAGNOSIS — I10 Essential (primary) hypertension: Secondary | ICD-10-CM

## 2024-10-20 DIAGNOSIS — R0600 Dyspnea, unspecified: Secondary | ICD-10-CM

## 2024-10-20 DIAGNOSIS — R0602 Shortness of breath: Secondary | ICD-10-CM | POA: Insufficient documentation

## 2024-10-20 DIAGNOSIS — R600 Localized edema: Secondary | ICD-10-CM | POA: Diagnosis not present

## 2024-10-20 DIAGNOSIS — Z79899 Other long term (current) drug therapy: Secondary | ICD-10-CM

## 2024-10-20 DIAGNOSIS — I2510 Atherosclerotic heart disease of native coronary artery without angina pectoris: Secondary | ICD-10-CM

## 2024-10-21 LAB — ECHOCARDIOGRAM COMPLETE
AV Mean grad: 16.5 mmHg
AV Peak grad: 28 mmHg
Ao pk vel: 2.65 m/s
Area-P 1/2: 4.8 cm2
S' Lateral: 5.4 cm

## 2024-10-21 MED ORDER — VITAMIN D 50 MCG (2000 UT) PO CAPS: 2000.0000 [IU] | ORAL_CAPSULE | Freq: Every day | ORAL | Status: AC

## 2024-10-21 MED ORDER — FUROSEMIDE 40 MG PO TABS: ORAL_TABLET | ORAL | 0 refills | Status: AC

## 2024-10-21 NOTE — Telephone Encounter (Signed)
 Patient's daughter returning call. Please advise

## 2024-10-22 ENCOUNTER — Telehealth: Payer: Self-pay

## 2024-10-22 NOTE — Telephone Encounter (Signed)
 Per Dr. Okey, scheduled the patient for TAVR consult with Dr. Wonda 11/05/2024. The patient was grateful for call and agreed with plan.

## 2024-10-22 NOTE — Progress Notes (Signed)
 Placed a referral for [facility] with Dr. Wonda, per Dr. Okey.

## 2024-10-22 NOTE — Telephone Encounter (Signed)
-----   Message from [name], MD sent at 10/22/2024  6:16 AM EST ----- Sure I'd be happy to see her. [name] ----- Message ----- From: Okey [name] GAILS, MD Sent: 10/21/2024   3:35 PM EST To: [name], MD; Lamarr DELENA Redman, RN  [name] if you could see this pt in clinic   I saw last week as follow up  Otherwise I saw her in hosp last may 2025 Admitted with fatigue  Trop elevated   Echo Mod AS  LVEF low normal.   I recomm CCTA   Patient then had TIA   Ended up getting myoview  (no ischemia).  Went on to have carotid stent.   CCTA order never cancelled   Pt had it.  AV score was 1619  FFR positive Went on to have LHC:   No significant dz   No AS.        Pt now with CHF symptoms     Echo yesterday shows LVEF now 20 to 25%   AV calcified     Mean gradient lower, AS reported as mild  but DVI is 0.26     Not sure how cath fits in but wonder if missing low gradient severe AS.

## 2024-10-22 NOTE — Addendum Note (Signed)
 Addended by: DRENA MARTINIS, Ashlon Lottman L on: 10/22/2024 08:43 AM   Modules accepted: Orders

## 2024-10-22 NOTE — Progress Notes (Signed)
 Please make appt for patient to see [name] for evaluation of aortic stenosis

## 2024-10-29 ENCOUNTER — Telehealth: Payer: Self-pay | Admitting: Internal Medicine

## 2024-10-29 NOTE — Telephone Encounter (Signed)
 Spoke with pt on the phone and informed her she could resume taking it in the mornings now as to not be up all night going to the bathroom. Pt is aware that she can go back to taking 40mg  daily since it has been a week. Pt states that swelling in feet have gone down and she feels much better. Pt has another question, but says she forgot and asked if she could call back. If patient calls back today can transfer to my extension.

## 2024-10-29 NOTE — Telephone Encounter (Signed)
 Pt c/o medication issue:  1. Name of Medication: furosemide  (LASIX ) 40 MG tablet   2. How are you currently taking this medication (dosage and times per day)? As written   3. Are you having a reaction (difficulty breathing--STAT)? no  4. What is your medication issue? Pt took medication last night due to missed am dose and would like to know if she could take the medication again this morning along with daily meds

## 2024-11-05 ENCOUNTER — Encounter: Payer: Self-pay | Admitting: Cardiovascular Disease

## 2024-11-05 ENCOUNTER — Ambulatory Visit: Admitting: Cardiovascular Disease

## 2024-11-05 ENCOUNTER — Telehealth: Payer: Self-pay | Admitting: Licensed Clinical Social Worker

## 2024-11-05 VITALS — BP 124/56 | HR 61 | Ht 66.0 in | Wt 222.0 lb

## 2024-11-05 DIAGNOSIS — I5020 Unspecified systolic (congestive) heart failure: Secondary | ICD-10-CM | POA: Diagnosis not present

## 2024-11-05 DIAGNOSIS — Z01812 Encounter for preprocedural laboratory examination: Secondary | ICD-10-CM

## 2024-11-05 DIAGNOSIS — I350 Nonrheumatic aortic (valve) stenosis: Secondary | ICD-10-CM

## 2024-11-05 DIAGNOSIS — Z91120 Patient's intentional underdosing of medication regimen due to financial hardship: Secondary | ICD-10-CM

## 2024-11-05 DIAGNOSIS — I2510 Atherosclerotic heart disease of native coronary artery without angina pectoris: Secondary | ICD-10-CM | POA: Diagnosis not present

## 2024-11-05 DIAGNOSIS — I10 Essential (primary) hypertension: Secondary | ICD-10-CM

## 2024-11-05 NOTE — H&P (View-Only) (Signed)
 " Cardiology Office Note:    Date:  11/13/2024   ID:  KANITRA [name], DOB Apr 17, 1941, MRN [id number]  PCP:  Arloa Jarvis, NP   [facility] Providers Cardiologist:  Vina Gull, MD     Referring MD: Arloa Jarvis, NP   Chief Complaint  Patient presents with   Aortic Stenosis    History of Present Illness:    [name] is a 84 y.o. female referred by Dr. Gull for evaluation of aortic stenosis.  The patient has been followed for coronary artery disease and aortic stenosis.  She has a chronic left bundle branch block.  She has a history of carotid stenting and has had a TIA in the past.  The patient had a coronary CTA suggesting hemodynamically significant distal left main stenosis but cardiac catheterization in June 2025 showed nonobstructive coronary artery disease involving the LAD and RCA.  Recent echocardiogram demonstrated LVEF 20 to 25% and mild aortic stenosis with mean gradient 17 mmHg on formal interpretation. However, after review of echo findings, felt to possibly have severe LFLG AS with low LVOT/Ao ratio of 0.26 and calcified/restricted valve leaflets on image review. Prior CT showed severe aortic valve calcification with Ao calcium  score 1619. Pt was hospitalized with heart failure when she was at the beach with her [place] in 11/25. She was placed on increased diuretics and has been feeling better since that time.   Here with family today. Not very active anymore. Continued symptoms include fatigue and exertional dyspnea. Pt denies chest pain, orthopnea, PND. States leg swelling has improved.   Past Medical History:  Diagnosis Date   Abdominal bloating 02/17/2019   Abdominal pain    AKI (acute kidney injury) 02/12/2023   Arthritis    cervical disc degeneration/ oa left knee, carpal tunnel rt wrist, adhesive capsulitis right shoulder, rt hand weakness; lumbar degeneration   Carpal tunnel syndrome    CARPAL TUNNEL SYNDROME, RIGHT 10/14/2010   Qualifier:  Diagnosis of   By: Eyvonne MD, Debby LELON        CHEST PAIN, ATYPICAL 07/01/2010   Qualifier: Diagnosis of   By: Domenica MD, Harlene Cahill acquired pneumonia 05/06/2020   Complication of anesthesia 2006-at [facility]   breathing problems-no BP med given prior to surgery;  hx of being very sleepy after colon surgery --  states no problems with last right total knee replacement 2013   Constipation    Diabetes mellitus without complication (HCC)    borderline - diet control   Diverticulitis hx of   Diverticulosis    E. coli infection    2020   Frequent UTI    hx of urethral injury during colon surgery - states frequent uti's since   GERD (gastroesophageal reflux disease)    Gout, unspecified 09/09/2007   Qualifier: Diagnosis of   By: Mavis MD, Norleen BRAVO        H. pylori infection 07/18/2010   H/O hiatal hernia    HIP PAIN, LEFT, CHRONIC 11/09/2010   Qualifier: Diagnosis of   By: Mavis MD, Norleen BRAVO        History of colon polyps 08/26/2008   History of palpitations    in the past   History of shingles    has a lingering itching on back where shingles were   Hyperlipidemia    Hypertension    Hypokalemia 10/19/2022   KNEE PAIN, RIGHT, CHRONIC 10/14/2010   Qualifier: Diagnosis of  By: Eyvonne MD, Debby ORN        Lightheaded 02/17/2019   Morbidly obese (HCC) 05/31/2011   bmi of 44     Numbness and tingling in right hand    pt. 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   Shortness of breath 09/14/2021   Shortness of breath 09/14/2021   Spasm of lumbar paraspinous muscle 06/17/2011   Stroke (HCC)    Stroke-like symptom    Subacute intracranial hemorrhage (HCC) 10/18/2022   Transient speech  disturbance 04/18/2022   Urinary frequency 02/17/2019   Vaginal discharge 08/02/2011   Vitamin D  deficiency    Past Surgical History:  Procedure Laterality Date   ABDOMINAL HYSTERECTOMY     bladder tack     BREAST BIOPSY Left    BREAST SURGERY     breast duct resection- benign   CARPAL TUNNEL RELEASE Right 06/04/2014   Procedure: RIGHT CARPAL TUNNEL RELEASE;  Surgeon: Elsie Mussel, MD;  Location: [facility];  Service: Orthopedics;  Laterality: Right;   COLON RESECTION  2008   hx diverticulosis   COLON SURGERY     COLONOSCOPY     colonoscopy [id number]     JOINT REPLACEMENT     LEFT HEART CATH AND CORONARY ANGIOGRAPHY N/A 04/15/2024   Procedure: LEFT HEART CATH AND CORONARY ANGIOGRAPHY;  Surgeon: Anner Alm ORN, MD;  Location: [facility];  Service: Cardiovascular;  Laterality: N/A;   OOPHORECTOMY     POLYPECTOMY     skin graft left arm - traumatic compression injury left upper arm     temporary ureter stent     TOTAL KNEE ARTHROPLASTY  01/05/2012   Procedure: TOTAL KNEE ARTHROPLASTY;  Surgeon: Dempsey LULLA Moan, MD;  Location: [facility];  Service: Orthopedics;  Laterality: Right;   TOTAL KNEE ARTHROPLASTY Left 08/17/2014   Procedure: LEFT TOTAL KNEE ARTHROPLASTY;  Surgeon: Dempsey Moan LULLA, MD;  Location: [facility];  Service: Orthopedics;  Laterality: Left;   TRANSCAROTID ARTERY REVASCULARIZATION  Left 03/07/2024   Procedure: TRANSCAROTID ARTERY REVASCULARIZATION (TCAR);  Surgeon: Serene Gaile ORN, MD;  Location: [facility] OR;  Service: Vascular;  Laterality: Left;   ureter repair for tyransected left ureter      Current Medications: Active Medications[1]   Allergies:   Bee venom, Latex, Tape, Codeine, Hydrocodone, and Oxycodone    ROS:   Please see the history of present illness.    All other systems reviewed and are negative.  EKGs/Labs/Other Studies Reviewed:    The following studies were reviewed today: Cardiac Studies & Procedures    ______________________________________________________________________________________________ CARDIAC CATHETERIZATION  CARDIAC CATHETERIZATION 04/15/2024  Conclusion Images from the original result were not included.    Mid LAD lesion is 50% stenosed.   Prox RCA to Mid RCA lesion is 50% stenosed with 50% stenosed side branch in RV Branch.   LV end diastolic pressure is normal.   There is no aortic valve stenosis.   In the absence of any other complications or medical issues, we expect the patient to be ready for discharge from a cath perspective on 04/15/2024.   Continue aspirin  Plavix  per carotid stent, following planned duration of dual  antiplatelet therapy, would reduce to monotherapy based on VVS (Dr. Serene) recommendations.  Dominance: Right  False positive Coronary CTA with the most 50% mid RCA and LAD stenoses but no obvious obstructive CAD suggesting FFR Positive Left Main and RCA lesions. Normal LVEDP  RECOMMENDATIONS   In the absence of any other complications or medical issues, we expect the patient to be ready for discharge from a cath perspective on 04/15/2024.   Continue aspirin  Plavix  per carotid stent, following planned duration of dual antiplatelet therapy, would reduce to monotherapy based on VVS (Dr. Serene) recommendations.  Findings Coronary Findings Diagnostic  Dominance: Right  Left Main Vessel was injected. Vessel is normal in caliber. There is mild diffuse disease throughout the vessel.  Left Anterior Descending The vessel exhibits minimal luminal irregularities. Mid LAD lesion is 50% stenosed.  First Diagonal Branch Vessel is small in size.  Second Diagonal Branch Vessel is large in size.  Second Septal Branch Vessel is small in size.  Third Diagonal Branch Vessel is small in size.  Left Circumflex Vessel is normal in caliber.  Left Posterior Atrioventricular Artery Vessel is small in size.  Right Coronary Artery Vessel was injected.  Vessel is large. There is moderate focal disease in the vessel. The vessel exhibits minimal luminal irregularities. Prox RCA to Mid RCA lesion is 50% stenosed with 50% stenosed side branch in RV Branch.  Right Ventricular Branch Vessel is small in size.  Intervention  No interventions have been documented.   STRESS TESTS  NM MYOCAR MULTI W/SPECT W 03/06/2024  Narrative CLINICAL DATA:  84 year old female with chest pain. Abnormal EKG. Intermediate risk for coronary disease.  EXAM: MYOCARDIAL IMAGING WITH SPECT (REST AND PHARMACOLOGIC-STRESS)  GATED LEFT VENTRICULAR WALL MOTION STUDY  LEFT VENTRICULAR EJECTION FRACTION  TECHNIQUE: Standard myocardial SPECT imaging was performed after resting intravenous injection of 10 mCi Tc-17m tetrofosmin . Subsequently, intravenous infusion of Lexiscan  was performed under the supervision of the Cardiology staff. At peak effect of the drug, 30 mCi Tc-58m tetrofosmin  was injected intravenously and standard myocardial SPECT imaging was performed. Quantitative gated imaging was also performed to evaluate left ventricular wall motion, and estimate left ventricular ejection fraction.  COMPARISON:  None Available.  FINDINGS: Perfusion: Medium size region moderate decreased counts in the apical and mid segment of the anterior septal wall which are fixed on rest and stress. No evidence reversible ischemia. No ventricular dilatation.  Wall Motion: Normal left ventricular wall motion. No left ventricular dilation.  Left Ventricular Ejection Fraction: 38 %  End diastolic volume 76 ml  End systolic volume 46 ml  IMPRESSION: 1. 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 No evidence reversible ischemia.  2. Normal left ventricular wall motion.  3. Left ventricular ejection fraction 38%  4. Non invasive risk stratification*: High ( primarily based on low ejection fraction).  *2012 Appropriate Use Criteria  for Coronary Revascularization Focused Update: J Am Coll Cardiol. 2012;59(9):857-881. http://content.dementiazones.com.aspx?articleid=1201161   Electronically Signed By: Jackquline Boxer M.D. On: 03/06/2024 16:04   ECHOCARDIOGRAM  ECHOCARDIOGRAM COMPLETE 10/20/2024  Narrative ECHOCARDIOGRAM REPORT    Patient Name:   DEANNE BEDGOOD Date of Exam: 10/20/2024 Medical Rec #:  [id number]        Height:       66.0 in Accession #:    [id number]       Weight:       220.0 lb Date of Birth:  26-May-1941         BSA:  2.082 m Patient Age:    83 years         BP:           116/70 mmHg Patient Gender: F                HR:           120 bpm. Exam Location:  [address]  Procedure: 2D Echo, Cardiac Doppler and Color Doppler (Both Spectral and Color Flow Doppler were utilized during procedure).  Indications:    R06.00 Dyspnea  History:        Patient has prior history of Echocardiogram examinations, most recent 03/03/2024. Abnormal ECG, Stroke and TIA, Signs/Symptoms:Dyspnea, Dizziness/Lightheadedness, Edema, Altered Mental Status and Shortness of Breath; Risk Factors:Family History of Coronary Artery Disease, Hypertension, Diabetes and Dyslipidemia. Obesity, Pulmonary Edema, Left Breast Biopsy with Duct Resection.  Sonographer:    Heather Hawks RDCS Referring Phys: VINA LULLA GULL   Sonographer Comments: Patient is very concerned about her Medical History and where things went wrong. She is very cautious and does not want any needles or IV's or Blood with this test. IMPRESSIONS   1. Left ventricular ejection fraction, by estimation, is 20 to 25%. The left ventricle has severely decreased function. The left ventricle demonstrates global hypokinesis. Left ventricular diastolic parameters are consistent with Grade II diastolic dysfunction (pseudonormalization). Elevated left ventricular end-diastolic pressure. 2. Right ventricular systolic function is normal. The right  ventricular size is normal. 3. Left atrial size was mildly dilated. 4. The mitral valve is degenerative. Mild mitral valve regurgitation. Mild mitral stenosis. The mean mitral valve gradient is 3.5 mmHg. 5. The aortic valve is calcified. Aortic valve regurgitation is not visualized. Mild aortic valve stenosis. Aortic valve mean gradient measures 16.5 mmHg. Aortic valve Vmax measures 2.65 m/s. 6. The inferior vena cava is normal in size with greater than 50% respiratory variability, suggesting right atrial pressure of 3 mmHg.  Comparison(s): A prior study was performed on 02/2024. The left ventricular function is worsened.  FINDINGS Left Ventricle: Left ventricular ejection fraction, by estimation, is 20 to 25%. The left ventricle has severely decreased function. The left ventricle demonstrates global hypokinesis. The left ventricular internal cavity size was normal in size. There is no left ventricular hypertrophy. Left ventricular diastolic parameters are consistent with Grade II diastolic dysfunction (pseudonormalization). Elevated left ventricular end-diastolic pressure.  Right Ventricle: The right ventricular size is normal. No increase in right ventricular wall thickness. Right ventricular systolic function is normal.  Left Atrium: Left atrial size was mildly dilated.  Right Atrium: Right atrial size was normal in size.  Pericardium: There is no evidence of pericardial effusion. Presence of epicardial fat layer.  Mitral Valve: The mitral valve is degenerative in appearance. Mild to moderate mitral annular calcification. Mild mitral valve regurgitation. Mild mitral valve stenosis. MV peak gradient, 9.3 mmHg. The mean mitral valve gradient is 3.5 mmHg.  Tricuspid Valve: The tricuspid valve is normal in structure. Tricuspid valve regurgitation is not demonstrated. No evidence of tricuspid stenosis.  Aortic Valve: The aortic valve is calcified. Aortic valve regurgitation is not visualized.  Mild aortic stenosis is present. Aortic valve mean gradient measures 16.5 mmHg. Aortic valve peak gradient measures 28.0 mmHg.  Pulmonic Valve: The pulmonic valve was normal in structure. Pulmonic valve regurgitation is mild. No evidence of pulmonic stenosis.  Aorta: The aortic root is normal in size and structure.  Venous: The inferior vena cava is normal in size with greater than 50% respiratory variability, suggesting  right atrial pressure of 3 mmHg.  IAS/Shunts: No atrial level shunt detected by color flow Doppler.   LEFT VENTRICLE PLAX 2D LVIDd:         5.90 cm Diastology LVIDs:         5.40 cm LV e' medial:    5.33 cm/s LV PW:         1.25 cm LV E/e' medial:  28.3 LV IVS:        1.05 cm LV e' lateral:   7.29 cm/s LV IVRT:       58 msec LV E/e' lateral: 20.7   RIGHT VENTRICLE RV Basal diam:  3.40 cm     PULMONARY VEINS RV S prime:     12.40 cm/s  A Reversal Velocity: 35.20 cm/s TAPSE (M-mode): 1.9 cm      Diastolic Velocity:  39.50 cm/s RVSP:           36.4 mmHg   S/D Velocity:        1.20 Systolic Velocity:   46.70 cm/s  LEFT ATRIUM             Index        RIGHT ATRIUM           Index LA diam:        4.20 cm 2.02 cm/m   RA Pressure: 3.00 mmHg LA Vol (A2C):   80.2 ml 38.51 ml/m  RA Area:     17.00 cm LA Vol (A4C):   65.5 ml 31.45 ml/m  RA Volume:   45.10 ml  21.66 ml/m LA Biplane Vol: 75.5 ml 36.26 ml/m AORTIC VALVE AV Vmax:           264.75 cm/s AV Vmean:          183.250 cm/s AV VTI:            0.586 m AV Peak Grad:      28.0 mmHg AV Mean Grad:      16.5 mmHg LVOT Vmax:         71.50 cm/s LVOT Vmean:        43.250 cm/s LVOT VTI:          0.150 m LVOT/AV VTI ratio: 0.26  AORTA Ao Asc diam: 3.30 cm  MITRAL VALVE                TRICUSPID VALVE MV Area (PHT)  cm          TR Peak grad:   33.4 mmHg MV Peak grad:  9.3 mmHg     TR Vmax:        289.00 cm/s MV Mean grad:  3.5 mmHg     Estimated RAP:  3.00 mmHg MV Vmax:       1.52 m/s     RVSP:           36.4  mmHg MV Vmean:      79.4 cm/s MV Decel Time: 158 msec     SHUNTS MV E velocity: 151.00 cm/s  Systemic VTI: 0.15 m MV A velocity: 102.00 cm/s MV E/A ratio:  1.48  Kardie Tobb DO Electronically signed by Dub Huntsman DO Signature Date/Time: 10/21/2024/8:58:16 AM    Final    MONITORS  LONG TERM MONITOR (3-14 DAYS) 03/21/2024  Narrative Patch Wear Time:  3 days and 22 hours (2025-05-14T16:44:54-0400 to 2025-05-18T15:33:28-0400)  Impression:  Sinus rhythm:   Rates 45 to 121 bpm   Average HR 70 bpm  Rare PACs   Occasional  PVCs (7.1% total)  No sigifcant arrhythmia  Triggered event correlated with SR with PVC   CT SCANS  CT CORONARY MORPH W/CTA COR W/SCORE 03/28/2024  Addendum 04/02/2024  9:57 PM ADDENDUM REPORT: 04/02/2024 21:54  EXAM: OVER-READ INTERPRETATION  CT CHEST  The following report is an over-read performed by radiologist Dr. Fonda Mom [facility] on 04/02/2024. This over-read does not include interpretation of cardiac or coronary anatomy or pathology. The coronary CTA interpretation by the cardiologist is attached.  COMPARISON:  04/08/2005.  FINDINGS: Cardiovascular:  See findings discussed in the body of the report.  Mediastinum/Nodes: No suspicious adenopathy identified. Imaged mediastinal structures are unremarkable.  Lungs/Pleura: There is dependent basilar subsegmental atelectasis. No pneumonia or pulmonary edema. No pleural effusion or pneumothorax.  Upper Abdomen: Numerous calcified gallstones.  Musculoskeletal: No chest wall abnormality. No acute osseous findings. Thoracic degenerative changes.  IMPRESSION: Cholelithiasis.   Electronically Signed By: Fonda Field M.D. On: 04/02/2024 21:54  Narrative CLINICAL DATA:  59F with abnormal myoview   EXAM: Cardiac/Coronary CTA  TECHNIQUE: A non-contrast, gated CT scan was obtained with axial slices of 2.5 mm through the heart for calcium  scoring. Calcium  scoring  was performed using the Agatston method. A 120 kV prospective, gated, contrast cardiac CT scan was obtained. Gantry rotation speed was 230 msec and collimation was 0.63 mm. Two sublingual nitroglycerin  tablets (0.8 mg) were given. The 3D data set was reconstructed with motion correction for the best systolic or diastolic phase. Images were analyzed on a dedicated workstation using MPR, MIP, and VRT modes. The patient received 95 cc of contrast.  FINDINGS: Image quality: Good  Noise artifact is: Limited.  Coronary Arteries:  Normal coronary origin.  Right dominance.  Left main: The left main is a large caliber vessel with a normal take off from the left coronary cusp that bifurcates to form a left anterior descending artery and a left circumflex artery. Mixed plaque in left main causes ~50% stenosis  Left anterior descending artery: The LAD is patent. The LAD gives off 3 patent diagonal branches. Calcified plaque in proximal LAD causes 25-49% stenosis. Calcified plaque in mid LAD causes 25-49% stenosis. Calcified plaque in D2 causes 25-49% stenosis. Calcified plaque in D3 causes 25-49% stenosis  Ramus intermedius: Patent with no evidence of plaque or stenosis.  Left circumflex artery: The LCX is non-dominant and patent. The LCX gives off 1 patent obtuse marginal branch. Calcified plaque in proximal LCX causes 25-49% stenosis  Right coronary artery: The RCA is dominant with normal take off from the right coronary cusp. The RCA terminates as a PDA and right posterolateral branch. Mixed plaque in proximal RCA causes 50-69% stenosis. Mixed plaque in mid RCA causes 50-69% stenosis. Calcified plaque in distal RCA causes 25-49% stenosis  Right Atrium: Right atrial size is within normal limits.  Right Ventricle: The right ventricular cavity is within normal limits.  Left Atrium: Left atrial size is normal in size with no left atrial appendage filling defect.  Left Ventricle: The  ventricular cavity size is within normal limits.  Pulmonary arteries: Normal in size.  Pulmonary veins: Normal pulmonary venous drainage.  Pericardium: Normal thickness without significant effusion or calcium  present.  Cardiac valves: The aortic valve is trileaflet with severe calcifications (AV calcium  score 1619). Severe mitral annular calcifications  Aorta: Normal caliber  Extra-cardiac findings: See attached radiology report for non-cardiac structures.  IMPRESSION: 1. Coronary calcium  score of 1965. This was 98th percentile for age-, sex, and race-matched controls.  2. Total plaque  volume 189mm3 which is 96th percentile for age- and sex-matched controls (calcified plaque 575mm3; non-calcified plaque 1287mm3). TPV is extensive  3.  Normal coronary origin with right dominance.  4.  Normal coronary arteries.  5.  Mixed plaque in left main causes ~50% stenosis  6.  Moderate (50-69%) stenosis in proximal and mid RCA  7. Mild (25-49%) stenosis in proximal/mid/distal LAD, proximal LCX, distal RCA, D2, and D3  8.  Severe aortic valve calcifications (AV calcium  score 1619)  9.  Severe mitral annular calcifications  8.  Will send study for CTFFR  RECOMMENDATIONS: CAD-RADS 4: Severe stenosis. (70-99% or > 50% left main). Cardiac catheterization or CT FFR is recommended. Consider symptom-guided anti-ischemic pharmacotherapy as well as risk factor modification per guideline directed care. Invasive coronary angiography recommended with revascularization per published guideline statements.  Electronically Signed: By: Lonni Nanas M.D. On: 03/31/2024 11:10     ______________________________________________________________________________________________      EKG:   EKG Interpretation Date/Time:  Wednesday November 05 2024 11:41:20 EST Ventricular Rate:  61 PR Interval:  144 QRS Duration:  160 QT Interval:  446 QTC Calculation: 448 R Axis:   -34  Text  Interpretation: Normal sinus rhythm Left axis deviation Left bundle branch block When compared with ECG of 08-Oct-2024 10:37, Premature ventricular complexes are no longer Present QRS axis Shifted left T wave inversion no longer evident in Inferior leads QT has shortened Confirmed by Wonda Sharper [phone] on 11/05/2024 11:49:51 AM    Recent Labs: 03/04/2024: B Natriuretic Peptide 157.0 10/17/2024: ALT 58; Magnesium  2.0; NT-Pro BNP 2,134 11/05/2024: BUN 20; Creatinine, Ser 1.10; Hemoglobin 14.1; Platelets 190; Potassium 4.3; Sodium 141  Recent Lipid Panel    Component Value Date/Time   CHOL 168 03/05/2024 0516   CHOL 171 03/01/2022 1108   TRIG 195 (H) 03/05/2024 0516   HDL 39 (L) 03/05/2024 0516   HDL 39 (L) 03/01/2022 1108   CHOLHDL 4.3 03/05/2024 0516   VLDL 39 03/05/2024 0516   LDLCALC 90 03/05/2024 0516   LDLCALC 103 (H) 03/01/2022 1108   LDLDIRECT 162.2 11/21/2013 1135            Physical Exam:    VS:  BP (!) 124/56   Pulse 61   Ht 5' 6 (1.676 m)   Wt 222 lb (100.7 kg)   SpO2 97%   BMI 35.83 kg/m     Wt Readings from Last 3 Encounters:  11/05/24 222 lb (100.7 kg)  10/17/24 220 lb (99.8 kg)  10/08/24 222 lb (100.7 kg)     GEN:  Well nourished, well developed in no acute distress HEENT: Normal NECK: No JVD; No carotid bruits LYMPHATICS: No lymphadenopathy CARDIAC: RRR, 2/6 crescendo decrescendo murmur at the RUSB, A2 audible.  RESPIRATORY:  Clear to auscultation without rales, wheezing or rhonchi  ABDOMEN: Soft, non-tender, non-distended MUSCULOSKELETAL:  trace BL ankle edema; No deformity  SKIN: Warm and dry NEUROLOGIC:  Alert and oriented x 3 PSYCHIATRIC:  Normal affect   Assessment & Plan Nonrheumatic aortic valve stenosis Pt with possible Stage D2 aortic stenosis (LFLG with reduced LVEF). Some discrepant data and difficult acoustic windows makes diagnostics indeterminate. She has had recent heart failure admission and significant decline in LV function by  echo assessment. I have reviewed the natural history of aortic stenosis with the patient and their family members who are present today. We have discussed the limitations of medical therapy and the poor prognosis associated with symptomatic aortic stenosis. We have reviewed potential treatment options,  including palliative medical therapy, conventional surgical aortic valve replacement, and transcatheter aortic valve replacement. We discussed treatment options in the context of the patient's specific comorbid medical conditions. They understand that I am uncertain as to whether she has moderate or severe AS and I've recommended further evaluation. As outlined above, her echo shows a mean transaortic gradient of only 17 mmHg, but LVEF is 20-25%. The aortic valve appears significantly calcified and restricted by my review of her echo images, but acoustic windows are difficult. The DI is low at 0.26. CT last year showed an aortic valve Ca score > 1600. I have recommended R/L heart catheterization for full hemodynamic assessment. Will likely perform simultaneous pressure measurement for true aortic valve gradients ([place]ston pigtail dual lumen catheter). I have reviewed the risks, indications, and alternatives to cardiac catheterization, possible angioplasty, and stenting with the patient. Risks include but are not limited to bleeding, infection, vascular injury, stroke, myocardial infection, arrhythmia, kidney injury, radiation-related injury in the case of prolonged fluoroscopy use, emergency cardiac surgery, and death. The patient understands the risks of serious complication is 1-2 in 1000 with diagnostic cardiac cath and 1-2% or less with angioplasty/stenting.  Will send for CTA heart and CTA chest, abd, pelvis once cardiac cath is completed if cath data suggest severe AS. Further multidisciplinary evaluation pending results of those studies.  Essential hypertension BP Controlled.  Coronary artery disease  involving native coronary artery of native heart without angina pectoris Cath films reviewed. See above. Continue current management.  Pre-procedure lab exam Pre-cath labs Pt intentl undrdose of meds regimen due to financl hardship  HFrEF (heart failure with reduced ejection fraction) [facility]) Discussed management with Dr Okey. Recently diagnosed with progressive LV dysfunction, now LVEF < 30%. She will review studies when completed and get patient back in for GDMT titration. Pt with LBBB consider EP referral if no improvement of LV dysfunction with med Rx.        Informed Consent   Shared Decision Making/Informed Consent The risks [stroke (1 in 1000), death (1 in 1000), kidney failure [usually temporary] (1 in 500), bleeding (1 in 200), allergic reaction [possibly serious] (1 in 200)], benefits (diagnostic support and management of coronary artery disease) and alternatives of a cardiac catheterization were discussed in detail with Ms. Redditt and she is willing to proceed.       Medication Adjustments/Labs and Tests Ordered: Current medicines are reviewed at length with the patient today.  Concerns regarding medicines are outlined above.  Orders Placed This Encounter  Procedures   CT CORONARY MORPH W/CTA COR W/SCORE W/CA W/CM &/OR WO/CM   CT ANGIO ABDOMEN PELVIS  W & WO CONTRAST   CT ANGIO CHEST AORTA W/CM & OR WO/CM   Basic metabolic panel with GFR   CBC   Referral to HRT/VAS Care Navigation   EKG 12-Lead   No orders of the defined types were placed in this encounter.   Patient Instructions  Medication Instructions:  No medication changes were made at this visit. Continue current regimen.   *If you need a refill on your cardiac medications before your next appointment, please call your pharmacy*  Lab Work: To be completed today: BMP and CBC  If you have labs (blood work) drawn today and your tests are completely normal, you will receive your results only by: MyChart Message (if  you have MyChart) OR A paper copy in the mail If you have any lab test that is abnormal or we need to change your treatment,  we will call you to review the results.  Testing/Procedures: Your physician has requested that you have a cardiac catheterization. Cardiac catheterization is used to diagnose and/or treat various heart conditions. Doctors may recommend this procedure for a number of different reasons. The most common reason is to evaluate chest pain. Chest pain can be a symptom of coronary artery disease (CAD), and cardiac catheterization can show whether plaque is narrowing or blocking your hearts arteries. This procedure is also used to evaluate the valves, as well as measure the blood flow and oxygen levels in different parts of your heart. For further information please visit [email or web address] Please follow instruction sheet, as given.  You will have TAVR CT scans completed on 11/14/24. Please arrive by 2:15 PM on the 2nd floor. A letter is attached to this paperwork.  You will have a surgical consult for your TAVR workup. Our office will call you to schedule this appointment.   Follow-Up: At [facility], you and your health needs are our priority.  As part of our continuing mission to provide you with exceptional heart care, our providers are all part of one team.  This team includes your primary Cardiologist (physician) and Advanced Practice Providers or APPs (Physician Assistants and Nurse Practitioners) who all work together to provide you with the care you need, when you need it.  Your next appointment:   Per structural heart team   Other Instructions       Cardiac/Peripheral Catheterization   You are scheduled for a Cardiac Catheterization on Thursday, January 22 with Dr. [name].  1. Please arrive at the [facility] (Main Entrance A) at [facility]: [address] [place], [place] [address] at 5:30 AM (This time is 2 hour(s) before your  procedure to ensure your preparation). Your procedure is scheduled for 7:30 AM.  Free valet parking service is available. You will check in at ADMITTING. The support person will be asked to wait in the waiting room.  It is OK to have someone drop you off and come back when you are ready to be discharged.        Special note: Every effort is made to have your procedure done on time. Please understand that emergencies sometimes delay scheduled procedures.  2. Diet: Nothing to eat after midnight.  3. Hydration:You need to be well hydrated before your procedure. On January 22, you may drink approved liquids (see below) until 2 hours before the procedure, with 16 oz of water as your last intake.   List of approved liquids water, clear juice, clear tea, black coffee, fruit juices, non-citric and without pulp, carbonated beverages, Gatorade, Kool -Aid, plain Jello-O and plain ice popsicles.  4. Labs: You will need to have blood drawn on Wednesday, January 7 at [facility] (1st Floor), [address] [place], [place] [address]. You do not need to be fasting.  5. Medication instructions in preparation for your procedure:   Contrast Allergy: No  Stop taking, Lasix  (Furosemide )  Wednesday, January 21, last dose being on Tuesday January 20.  On the morning of your procedure, take Aspirin  81 mg and Plavix /Clopidogrel  and any morning medicines NOT listed above.  You may use sips of water.  6. Plan to go home the same day, you will only stay overnight if medically necessary. 7. You MUST have a responsible adult to drive you home. 8. An adult MUST be with you the first 24 hours after  you arrive home. 9. Bring a current list of your medications, and the last time and date medication taken. 10. Bring ID and current insurance cards. 11.Please wear clothes that are easy to get on and off and wear slip-on shoes.  Thank you for allowing us  to care for you!   --  [facility]     Signed, [name], MD  11/13/2024 7:03 AM     [facility]     [1]  Current Meds  Medication Sig   acetaminophen  (TYLENOL ) 325 MG tablet Take 2 tablets (650 mg total) by mouth every 6 (six) hours as needed for mild pain (pain score 1-3) (or Fever >/= 101).   amLODipine -olmesartan  (AZOR) 10-40 MG tablet Take 1 tablet by mouth every morning.   aspirin  EC 81 MG tablet Take 81 mg by mouth daily. Swallow whole.   Blood Pressure KIT 1 Units by Does not apply route daily. 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   clopidogrel  (PLAVIX ) 75 MG tablet Take 1 tablet (75 mg total) by mouth daily.   Continuous Glucose Receiver (FREESTYLE LIBRE READER) DEVI Use as directed to check blood glucose level   Continuous Glucose Sensor (FREESTYLE LIBRE 3 PLUS SENSOR) MISC Change sensor every 15 days.   ezetimibe  (ZETIA ) 10 MG tablet Take 1 tablet (10 mg total) by mouth daily.   furosemide  (LASIX ) 40 MG tablet Take 1.5 tablets (60 mg total) every other day, alternating with 1 tablet (40 mg total) on the other days for 1 week, then resume taking 1 tablet daily thereafter.   Lancets (ONETOUCH DELICA PLUS LANCET33G) MISC USE TO CHECK BLOOD SUGAR TWICE DAILY   nitroGLYCERIN  (NITROSTAT ) 0.4 MG SL tablet Place 1 tablet (0.4 mg total) under the tongue every 5 (five) minutes as needed for chest pain.   ONETOUCH VERIO test strip USE TO TEST 2 TIMES DAILY   pantoprazole  (PROTONIX ) 40 MG tablet Take 40 mg by mouth daily.   polyethylene glycol (MIRALAX  / GLYCOLAX ) 17 g packet Take 17 g by mouth daily as needed for moderate constipation.   potassium chloride  (KLOR-CON ) 10 MEQ tablet Take 10 mEq by mouth daily.   rosuvastatin  (CRESTOR ) 40 MG tablet Take 1 tablet (40 mg total) by mouth daily.   "

## 2024-11-05 NOTE — Progress Notes (Signed)
 " Cardiology Office Note:    Date:  11/13/2024   ID:  [name], DOB Apr 17, 1941, MRN [id number]  PCP:  Arloa Jarvis, NP   [facility] Providers Cardiologist:  Vina Gull, MD     Referring MD: Arloa Jarvis, NP   Chief Complaint  Patient presents with   Aortic Stenosis    History of Present Illness:    [name] is a 84 y.o. female referred by Dr. Gull for evaluation of aortic stenosis.  The patient has been followed for coronary artery disease and aortic stenosis.  She has a chronic left bundle branch block.  She has a history of carotid stenting and has had a TIA in the past.  The patient had a coronary CTA suggesting hemodynamically significant distal left main stenosis but cardiac catheterization in June 2025 showed nonobstructive coronary artery disease involving the LAD and RCA.  Recent echocardiogram demonstrated LVEF 20 to 25% and mild aortic stenosis with mean gradient 17 mmHg on formal interpretation. However, after review of echo findings, felt to possibly have severe LFLG AS with low LVOT/Ao ratio of 0.26 and calcified/restricted valve leaflets on image review. Prior CT showed severe aortic valve calcification with Ao calcium  score 1619. Pt was hospitalized with heart failure when she was at the beach with her [place] in 11/25. She was placed on increased diuretics and has been feeling better since that time.   Here with family today. Not very active anymore. Continued symptoms include fatigue and exertional dyspnea. Pt denies chest pain, orthopnea, PND. States leg swelling has improved.   Past Medical History:  Diagnosis Date   Abdominal bloating 02/17/2019   Abdominal pain    AKI (acute kidney injury) 02/12/2023   Arthritis    cervical disc degeneration/ oa left knee, carpal tunnel rt wrist, adhesive capsulitis right shoulder, rt hand weakness; lumbar degeneration   Carpal tunnel syndrome    CARPAL TUNNEL SYNDROME, RIGHT 10/14/2010   Qualifier:  Diagnosis of   By: Eyvonne MD, Debby LELON        CHEST PAIN, ATYPICAL 07/01/2010   Qualifier: Diagnosis of   By: Domenica MD, Harlene Cahill acquired pneumonia 05/06/2020   Complication of anesthesia 2006-at [facility]   breathing problems-no BP med given prior to surgery;  hx of being very sleepy after colon surgery --  states no problems with last right total knee replacement 2013   Constipation    Diabetes mellitus without complication (HCC)    borderline - diet control   Diverticulitis hx of   Diverticulosis    E. coli infection    2020   Frequent UTI    hx of urethral injury during colon surgery - states frequent uti's since   GERD (gastroesophageal reflux disease)    Gout, unspecified 09/09/2007   Qualifier: Diagnosis of   By: Mavis MD, Norleen BRAVO        H. pylori infection 07/18/2010   H/O hiatal hernia    HIP PAIN, LEFT, CHRONIC 11/09/2010   Qualifier: Diagnosis of   By: Mavis MD, Norleen BRAVO        History of colon polyps 08/26/2008   History of palpitations    in the past   History of shingles    has a lingering itching on back where shingles were   Hyperlipidemia    Hypertension    Hypokalemia 10/19/2022   KNEE PAIN, RIGHT, CHRONIC 10/14/2010   Qualifier: Diagnosis of  By: Eyvonne MD, Debby ORN        Lightheaded 02/17/2019   Morbidly obese (HCC) 05/31/2011   bmi of 44     Numbness and tingling in right hand    pt. 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   Shortness of breath 09/14/2021   Shortness of breath 09/14/2021   Spasm of lumbar paraspinous muscle 06/17/2011   Stroke (HCC)    Stroke-like symptom    Subacute intracranial hemorrhage (HCC) 10/18/2022   Transient speech  disturbance 04/18/2022   Urinary frequency 02/17/2019   Vaginal discharge 08/02/2011   Vitamin D  deficiency    Past Surgical History:  Procedure Laterality Date   ABDOMINAL HYSTERECTOMY     bladder tack     BREAST BIOPSY Left    BREAST SURGERY     breast duct resection- benign   CARPAL TUNNEL RELEASE Right 06/04/2014   Procedure: RIGHT CARPAL TUNNEL RELEASE;  Surgeon: Elsie Mussel, MD;  Location: [facility];  Service: Orthopedics;  Laterality: Right;   COLON RESECTION  2008   hx diverticulosis   COLON SURGERY     COLONOSCOPY     colonoscopy [id number]     JOINT REPLACEMENT     LEFT HEART CATH AND CORONARY ANGIOGRAPHY N/A 04/15/2024   Procedure: LEFT HEART CATH AND CORONARY ANGIOGRAPHY;  Surgeon: Anner Alm ORN, MD;  Location: [facility];  Service: Cardiovascular;  Laterality: N/A;   OOPHORECTOMY     POLYPECTOMY     skin graft left arm - traumatic compression injury left upper arm     temporary ureter stent     TOTAL KNEE ARTHROPLASTY  01/05/2012   Procedure: TOTAL KNEE ARTHROPLASTY;  Surgeon: Dempsey LULLA Moan, MD;  Location: [facility];  Service: Orthopedics;  Laterality: Right;   TOTAL KNEE ARTHROPLASTY Left 08/17/2014   Procedure: LEFT TOTAL KNEE ARTHROPLASTY;  Surgeon: Dempsey Moan LULLA, MD;  Location: [facility];  Service: Orthopedics;  Laterality: Left;   TRANSCAROTID ARTERY REVASCULARIZATION  Left 03/07/2024   Procedure: TRANSCAROTID ARTERY REVASCULARIZATION (TCAR);  Surgeon: Serene Gaile ORN, MD;  Location: [facility] OR;  Service: Vascular;  Laterality: Left;   ureter repair for tyransected left ureter      Current Medications: Active Medications[1]   Allergies:   Bee venom, Latex, Tape, Codeine, Hydrocodone, and Oxycodone    ROS:   Please see the history of present illness.    All other systems reviewed and are negative.  EKGs/Labs/Other Studies Reviewed:    The following studies were reviewed today: Cardiac Studies & Procedures    ______________________________________________________________________________________________ CARDIAC CATHETERIZATION  CARDIAC CATHETERIZATION 04/15/2024  Conclusion Images from the original result were not included.    Mid LAD lesion is 50% stenosed.   Prox RCA to Mid RCA lesion is 50% stenosed with 50% stenosed side branch in RV Branch.   LV end diastolic pressure is normal.   There is no aortic valve stenosis.   In the absence of any other complications or medical issues, we expect the patient to be ready for discharge from a cath perspective on 04/15/2024.   Continue aspirin  Plavix  per carotid stent, following planned duration of dual  antiplatelet therapy, would reduce to monotherapy based on VVS (Dr. Serene) recommendations.  Dominance: Right  False positive Coronary CTA with the most 50% mid RCA and LAD stenoses but no obvious obstructive CAD suggesting FFR Positive Left Main and RCA lesions. Normal LVEDP  RECOMMENDATIONS   In the absence of any other complications or medical issues, we expect the patient to be ready for discharge from a cath perspective on 04/15/2024.   Continue aspirin  Plavix  per carotid stent, following planned duration of dual antiplatelet therapy, would reduce to monotherapy based on VVS (Dr. Serene) recommendations.  Findings Coronary Findings Diagnostic  Dominance: Right  Left Main Vessel was injected. Vessel is normal in caliber. There is mild diffuse disease throughout the vessel.  Left Anterior Descending The vessel exhibits minimal luminal irregularities. Mid LAD lesion is 50% stenosed.  First Diagonal Branch Vessel is small in size.  Second Diagonal Branch Vessel is large in size.  Second Septal Branch Vessel is small in size.  Third Diagonal Branch Vessel is small in size.  Left Circumflex Vessel is normal in caliber.  Left Posterior Atrioventricular Artery Vessel is small in size.  Right Coronary Artery Vessel was injected.  Vessel is large. There is moderate focal disease in the vessel. The vessel exhibits minimal luminal irregularities. Prox RCA to Mid RCA lesion is 50% stenosed with 50% stenosed side branch in RV Branch.  Right Ventricular Branch Vessel is small in size.  Intervention  No interventions have been documented.   STRESS TESTS  NM MYOCAR MULTI W/SPECT W 03/06/2024  Narrative CLINICAL DATA:  84 year old female with chest pain. Abnormal EKG. Intermediate risk for coronary disease.  EXAM: MYOCARDIAL IMAGING WITH SPECT (REST AND PHARMACOLOGIC-STRESS)  GATED LEFT VENTRICULAR WALL MOTION STUDY  LEFT VENTRICULAR EJECTION FRACTION  TECHNIQUE: Standard myocardial SPECT imaging was performed after resting intravenous injection of 10 mCi Tc-17m tetrofosmin . Subsequently, intravenous infusion of Lexiscan  was performed under the supervision of the Cardiology staff. At peak effect of the drug, 30 mCi Tc-58m tetrofosmin  was injected intravenously and standard myocardial SPECT imaging was performed. Quantitative gated imaging was also performed to evaluate left ventricular wall motion, and estimate left ventricular ejection fraction.  COMPARISON:  None Available.  FINDINGS: Perfusion: Medium size region moderate decreased counts in the apical and mid segment of the anterior septal wall which are fixed on rest and stress. No evidence reversible ischemia. No ventricular dilatation.  Wall Motion: Normal left ventricular wall motion. No left ventricular dilation.  Left Ventricular Ejection Fraction: 38 %  End diastolic volume 76 ml  End systolic volume 46 ml  IMPRESSION: 1. 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 No evidence reversible ischemia.  2. Normal left ventricular wall motion.  3. Left ventricular ejection fraction 38%  4. Non invasive risk stratification*: High ( primarily based on low ejection fraction).  *2012 Appropriate Use Criteria  for Coronary Revascularization Focused Update: J Am Coll Cardiol. 2012;59(9):857-881. http://content.dementiazones.com.aspx?articleid=1201161   Electronically Signed By: Jackquline Boxer M.D. On: 03/06/2024 16:04   ECHOCARDIOGRAM  ECHOCARDIOGRAM COMPLETE 10/20/2024  Narrative ECHOCARDIOGRAM REPORT    Patient Name:   [name] Date of Exam: 10/20/2024 Medical Rec #:  [id number]        Height:       66.0 in Accession #:    [id number]       Weight:       220.0 lb Date of Birth:  26-May-1941         BSA:  2.082 m Patient Age:    83 years         BP:           116/70 mmHg Patient Gender: F                HR:           120 bpm. Exam Location:  [address]  Procedure: 2D Echo, Cardiac Doppler and Color Doppler (Both Spectral and Color Flow Doppler were utilized during procedure).  Indications:    R06.00 Dyspnea  History:        Patient has prior history of Echocardiogram examinations, most recent 03/03/2024. Abnormal ECG, Stroke and TIA, Signs/Symptoms:Dyspnea, Dizziness/Lightheadedness, Edema, Altered Mental Status and Shortness of Breath; Risk Factors:Family History of Coronary Artery Disease, Hypertension, Diabetes and Dyslipidemia. Obesity, Pulmonary Edema, Left Breast Biopsy with Duct Resection.  Sonographer:    Heather Hawks RDCS Referring Phys: VINA LULLA GULL   Sonographer Comments: Patient is very concerned about her Medical History and where things went wrong. She is very cautious and does not want any needles or IV's or Blood with this test. IMPRESSIONS   1. Left ventricular ejection fraction, by estimation, is 20 to 25%. The left ventricle has severely decreased function. The left ventricle demonstrates global hypokinesis. Left ventricular diastolic parameters are consistent with Grade II diastolic dysfunction (pseudonormalization). Elevated left ventricular end-diastolic pressure. 2. Right ventricular systolic function is normal. The right  ventricular size is normal. 3. Left atrial size was mildly dilated. 4. The mitral valve is degenerative. Mild mitral valve regurgitation. Mild mitral stenosis. The mean mitral valve gradient is 3.5 mmHg. 5. The aortic valve is calcified. Aortic valve regurgitation is not visualized. Mild aortic valve stenosis. Aortic valve mean gradient measures 16.5 mmHg. Aortic valve Vmax measures 2.65 m/s. 6. The inferior vena cava is normal in size with greater than 50% respiratory variability, suggesting right atrial pressure of 3 mmHg.  Comparison(s): A prior study was performed on 02/2024. The left ventricular function is worsened.  FINDINGS Left Ventricle: Left ventricular ejection fraction, by estimation, is 20 to 25%. The left ventricle has severely decreased function. The left ventricle demonstrates global hypokinesis. The left ventricular internal cavity size was normal in size. There is no left ventricular hypertrophy. Left ventricular diastolic parameters are consistent with Grade II diastolic dysfunction (pseudonormalization). Elevated left ventricular end-diastolic pressure.  Right Ventricle: The right ventricular size is normal. No increase in right ventricular wall thickness. Right ventricular systolic function is normal.  Left Atrium: Left atrial size was mildly dilated.  Right Atrium: Right atrial size was normal in size.  Pericardium: There is no evidence of pericardial effusion. Presence of epicardial fat layer.  Mitral Valve: The mitral valve is degenerative in appearance. Mild to moderate mitral annular calcification. Mild mitral valve regurgitation. Mild mitral valve stenosis. MV peak gradient, 9.3 mmHg. The mean mitral valve gradient is 3.5 mmHg.  Tricuspid Valve: The tricuspid valve is normal in structure. Tricuspid valve regurgitation is not demonstrated. No evidence of tricuspid stenosis.  Aortic Valve: The aortic valve is calcified. Aortic valve regurgitation is not visualized.  Mild aortic stenosis is present. Aortic valve mean gradient measures 16.5 mmHg. Aortic valve peak gradient measures 28.0 mmHg.  Pulmonic Valve: The pulmonic valve was normal in structure. Pulmonic valve regurgitation is mild. No evidence of pulmonic stenosis.  Aorta: The aortic root is normal in size and structure.  Venous: The inferior vena cava is normal in size with greater than 50% respiratory variability, suggesting  right atrial pressure of 3 mmHg.  IAS/Shunts: No atrial level shunt detected by color flow Doppler.   LEFT VENTRICLE PLAX 2D LVIDd:         5.90 cm Diastology LVIDs:         5.40 cm LV e' medial:    5.33 cm/s LV PW:         1.25 cm LV E/e' medial:  28.3 LV IVS:        1.05 cm LV e' lateral:   7.29 cm/s LV IVRT:       58 msec LV E/e' lateral: 20.7   RIGHT VENTRICLE RV Basal diam:  3.40 cm     PULMONARY VEINS RV S prime:     12.40 cm/s  A Reversal Velocity: 35.20 cm/s TAPSE (M-mode): 1.9 cm      Diastolic Velocity:  39.50 cm/s RVSP:           36.4 mmHg   S/D Velocity:        1.20 Systolic Velocity:   46.70 cm/s  LEFT ATRIUM             Index        RIGHT ATRIUM           Index LA diam:        4.20 cm 2.02 cm/m   RA Pressure: 3.00 mmHg LA Vol (A2C):   80.2 ml 38.51 ml/m  RA Area:     17.00 cm LA Vol (A4C):   65.5 ml 31.45 ml/m  RA Volume:   45.10 ml  21.66 ml/m LA Biplane Vol: 75.5 ml 36.26 ml/m AORTIC VALVE AV Vmax:           264.75 cm/s AV Vmean:          183.250 cm/s AV VTI:            0.586 m AV Peak Grad:      28.0 mmHg AV Mean Grad:      16.5 mmHg LVOT Vmax:         71.50 cm/s LVOT Vmean:        43.250 cm/s LVOT VTI:          0.150 m LVOT/AV VTI ratio: 0.26  AORTA Ao Asc diam: 3.30 cm  MITRAL VALVE                TRICUSPID VALVE MV Area (PHT)  cm          TR Peak grad:   33.4 mmHg MV Peak grad:  9.3 mmHg     TR Vmax:        289.00 cm/s MV Mean grad:  3.5 mmHg     Estimated RAP:  3.00 mmHg MV Vmax:       1.52 m/s     RVSP:           36.4  mmHg MV Vmean:      79.4 cm/s MV Decel Time: 158 msec     SHUNTS MV E velocity: 151.00 cm/s  Systemic VTI: 0.15 m MV A velocity: 102.00 cm/s MV E/A ratio:  1.48  Kardie Tobb DO Electronically signed by Dub Huntsman DO Signature Date/Time: 10/21/2024/8:58:16 AM    Final    MONITORS  LONG TERM MONITOR (3-14 DAYS) 03/21/2024  Narrative Patch Wear Time:  3 days and 22 hours (2025-05-14T16:44:54-0400 to 2025-05-18T15:33:28-0400)  Impression:  Sinus rhythm:   Rates 45 to 121 bpm   Average HR 70 bpm  Rare PACs   Occasional  PVCs (7.1% total)  No sigifcant arrhythmia  Triggered event correlated with SR with PVC   CT SCANS  CT CORONARY MORPH W/CTA COR W/SCORE 03/28/2024  Addendum 04/02/2024  9:57 PM ADDENDUM REPORT: 04/02/2024 21:54  EXAM: OVER-READ INTERPRETATION  CT CHEST  The following report is an over-read performed by radiologist Dr. Fonda Mom [facility] on 04/02/2024. This over-read does not include interpretation of cardiac or coronary anatomy or pathology. The coronary CTA interpretation by the cardiologist is attached.  COMPARISON:  04/08/2005.  FINDINGS: Cardiovascular:  See findings discussed in the body of the report.  Mediastinum/Nodes: No suspicious adenopathy identified. Imaged mediastinal structures are unremarkable.  Lungs/Pleura: There is dependent basilar subsegmental atelectasis. No pneumonia or pulmonary edema. No pleural effusion or pneumothorax.  Upper Abdomen: Numerous calcified gallstones.  Musculoskeletal: No chest wall abnormality. No acute osseous findings. Thoracic degenerative changes.  IMPRESSION: Cholelithiasis.   Electronically Signed By: Fonda Field M.D. On: 04/02/2024 21:54  Narrative CLINICAL DATA:  59F with abnormal myoview   EXAM: Cardiac/Coronary CTA  TECHNIQUE: A non-contrast, gated CT scan was obtained with axial slices of 2.5 mm through the heart for calcium  scoring. Calcium  scoring  was performed using the Agatston method. A 120 kV prospective, gated, contrast cardiac CT scan was obtained. Gantry rotation speed was 230 msec and collimation was 0.63 mm. Two sublingual nitroglycerin  tablets (0.8 mg) were given. The 3D data set was reconstructed with motion correction for the best systolic or diastolic phase. Images were analyzed on a dedicated workstation using MPR, MIP, and VRT modes. The patient received 95 cc of contrast.  FINDINGS: Image quality: Good  Noise artifact is: Limited.  Coronary Arteries:  Normal coronary origin.  Right dominance.  Left main: The left main is a large caliber vessel with a normal take off from the left coronary cusp that bifurcates to form a left anterior descending artery and a left circumflex artery. Mixed plaque in left main causes ~50% stenosis  Left anterior descending artery: The LAD is patent. The LAD gives off 3 patent diagonal branches. Calcified plaque in proximal LAD causes 25-49% stenosis. Calcified plaque in mid LAD causes 25-49% stenosis. Calcified plaque in D2 causes 25-49% stenosis. Calcified plaque in D3 causes 25-49% stenosis  Ramus intermedius: Patent with no evidence of plaque or stenosis.  Left circumflex artery: The LCX is non-dominant and patent. The LCX gives off 1 patent obtuse marginal branch. Calcified plaque in proximal LCX causes 25-49% stenosis  Right coronary artery: The RCA is dominant with normal take off from the right coronary cusp. The RCA terminates as a PDA and right posterolateral branch. Mixed plaque in proximal RCA causes 50-69% stenosis. Mixed plaque in mid RCA causes 50-69% stenosis. Calcified plaque in distal RCA causes 25-49% stenosis  Right Atrium: Right atrial size is within normal limits.  Right Ventricle: The right ventricular cavity is within normal limits.  Left Atrium: Left atrial size is normal in size with no left atrial appendage filling defect.  Left Ventricle: The  ventricular cavity size is within normal limits.  Pulmonary arteries: Normal in size.  Pulmonary veins: Normal pulmonary venous drainage.  Pericardium: Normal thickness without significant effusion or calcium  present.  Cardiac valves: The aortic valve is trileaflet with severe calcifications (AV calcium  score 1619). Severe mitral annular calcifications  Aorta: Normal caliber  Extra-cardiac findings: See attached radiology report for non-cardiac structures.  IMPRESSION: 1. Coronary calcium  score of 1965. This was 98th percentile for age-, sex, and race-matched controls.  2. Total plaque  volume 189mm3 which is 96th percentile for age- and sex-matched controls (calcified plaque 575mm3; non-calcified plaque 1287mm3). TPV is extensive  3.  Normal coronary origin with right dominance.  4.  Normal coronary arteries.  5.  Mixed plaque in left main causes ~50% stenosis  6.  Moderate (50-69%) stenosis in proximal and mid RCA  7. Mild (25-49%) stenosis in proximal/mid/distal LAD, proximal LCX, distal RCA, D2, and D3  8.  Severe aortic valve calcifications (AV calcium  score 1619)  9.  Severe mitral annular calcifications  8.  Will send study for CTFFR  RECOMMENDATIONS: CAD-RADS 4: Severe stenosis. (70-99% or > 50% left main). Cardiac catheterization or CT FFR is recommended. Consider symptom-guided anti-ischemic pharmacotherapy as well as risk factor modification per guideline directed care. Invasive coronary angiography recommended with revascularization per published guideline statements.  Electronically Signed: By: Lonni Nanas M.D. On: 03/31/2024 11:10     ______________________________________________________________________________________________      EKG:   EKG Interpretation Date/Time:  Wednesday November 05 2024 11:41:20 EST Ventricular Rate:  61 PR Interval:  144 QRS Duration:  160 QT Interval:  446 QTC Calculation: 448 R Axis:   -34  Text  Interpretation: Normal sinus rhythm Left axis deviation Left bundle branch block When compared with ECG of 08-Oct-2024 10:37, Premature ventricular complexes are no longer Present QRS axis Shifted left T wave inversion no longer evident in Inferior leads QT has shortened Confirmed by Wonda Sharper [phone] on 11/05/2024 11:49:51 AM    Recent Labs: 03/04/2024: B Natriuretic Peptide 157.0 10/17/2024: ALT 58; Magnesium  2.0; NT-Pro BNP 2,134 11/05/2024: BUN 20; Creatinine, Ser 1.10; Hemoglobin 14.1; Platelets 190; Potassium 4.3; Sodium 141  Recent Lipid Panel    Component Value Date/Time   CHOL 168 03/05/2024 0516   CHOL 171 03/01/2022 1108   TRIG 195 (H) 03/05/2024 0516   HDL 39 (L) 03/05/2024 0516   HDL 39 (L) 03/01/2022 1108   CHOLHDL 4.3 03/05/2024 0516   VLDL 39 03/05/2024 0516   LDLCALC 90 03/05/2024 0516   LDLCALC 103 (H) 03/01/2022 1108   LDLDIRECT 162.2 11/21/2013 1135            Physical Exam:    VS:  BP (!) 124/56   Pulse 61   Ht 5' 6 (1.676 m)   Wt 222 lb (100.7 kg)   SpO2 97%   BMI 35.83 kg/m     Wt Readings from Last 3 Encounters:  11/05/24 222 lb (100.7 kg)  10/17/24 220 lb (99.8 kg)  10/08/24 222 lb (100.7 kg)     GEN:  Well nourished, well developed in no acute distress HEENT: Normal NECK: No JVD; No carotid bruits LYMPHATICS: No lymphadenopathy CARDIAC: RRR, 2/6 crescendo decrescendo murmur at the RUSB, A2 audible.  RESPIRATORY:  Clear to auscultation without rales, wheezing or rhonchi  ABDOMEN: Soft, non-tender, non-distended MUSCULOSKELETAL:  trace BL ankle edema; No deformity  SKIN: Warm and dry NEUROLOGIC:  Alert and oriented x 3 PSYCHIATRIC:  Normal affect   Assessment & Plan Nonrheumatic aortic valve stenosis Pt with possible Stage D2 aortic stenosis (LFLG with reduced LVEF). Some discrepant data and difficult acoustic windows makes diagnostics indeterminate. She has had recent heart failure admission and significant decline in LV function by  echo assessment. I have reviewed the natural history of aortic stenosis with the patient and their family members who are present today. We have discussed the limitations of medical therapy and the poor prognosis associated with symptomatic aortic stenosis. We have reviewed potential treatment options,  including palliative medical therapy, conventional surgical aortic valve replacement, and transcatheter aortic valve replacement. We discussed treatment options in the context of the patient's specific comorbid medical conditions. They understand that I am uncertain as to whether she has moderate or severe AS and I've recommended further evaluation. As outlined above, her echo shows a mean transaortic gradient of only 17 mmHg, but LVEF is 20-25%. The aortic valve appears significantly calcified and restricted by my review of her echo images, but acoustic windows are difficult. The DI is low at 0.26. CT last year showed an aortic valve Ca score > 1600. I have recommended R/L heart catheterization for full hemodynamic assessment. Will likely perform simultaneous pressure measurement for true aortic valve gradients ([place] pigtail dual lumen catheter). I have reviewed the risks, indications, and alternatives to cardiac catheterization, possible angioplasty, and stenting with the patient. Risks include but are not limited to bleeding, infection, vascular injury, stroke, myocardial infection, arrhythmia, kidney injury, radiation-related injury in the case of prolonged fluoroscopy use, emergency cardiac surgery, and death. The patient understands the risks of serious complication is 1-2 in 1000 with diagnostic cardiac cath and 1-2% or less with angioplasty/stenting.  Will send for CTA heart and CTA chest, abd, pelvis once cardiac cath is completed if cath data suggest severe AS. Further multidisciplinary evaluation pending results of those studies.  Essential hypertension BP Controlled.  Coronary artery disease  involving native coronary artery of native heart without angina pectoris Cath films reviewed. See above. Continue current management.  Pre-procedure lab exam Pre-cath labs Pt intentl undrdose of meds regimen due to financl hardship  HFrEF (heart failure with reduced ejection fraction) [facility]) Discussed management with Dr Okey. Recently diagnosed with progressive LV dysfunction, now LVEF < 30%. She will review studies when completed and get patient back in for GDMT titration. Pt with LBBB consider EP referral if no improvement of LV dysfunction with med Rx.        Informed Consent   Shared Decision Making/Informed Consent The risks [stroke (1 in 1000), death (1 in 1000), kidney failure [usually temporary] (1 in 500), bleeding (1 in 200), allergic reaction [possibly serious] (1 in 200)], benefits (diagnostic support and management of coronary artery disease) and alternatives of a cardiac catheterization were discussed in detail with Ms. Redditt and she is willing to proceed.       Medication Adjustments/Labs and Tests Ordered: Current medicines are reviewed at length with the patient today.  Concerns regarding medicines are outlined above.  Orders Placed This Encounter  Procedures   CT CORONARY MORPH W/CTA COR W/SCORE W/CA W/CM &/OR WO/CM   CT ANGIO ABDOMEN PELVIS  W & WO CONTRAST   CT ANGIO CHEST AORTA W/CM & OR WO/CM   Basic metabolic panel with GFR   CBC   Referral to HRT/VAS Care Navigation   EKG 12-Lead   No orders of the defined types were placed in this encounter.   Patient Instructions  Medication Instructions:  No medication changes were made at this visit. Continue current regimen.   *If you need a refill on your cardiac medications before your next appointment, please call your pharmacy*  Lab Work: To be completed today: BMP and CBC  If you have labs (blood work) drawn today and your tests are completely normal, you will receive your results only by: MyChart Message (if  you have MyChart) OR A paper copy in the mail If you have any lab test that is abnormal or we need to change your treatment,  we will call you to review the results.  Testing/Procedures: Your physician has requested that you have a cardiac catheterization. Cardiac catheterization is used to diagnose and/or treat various heart conditions. Doctors may recommend this procedure for a number of different reasons. The most common reason is to evaluate chest pain. Chest pain can be a symptom of coronary artery disease (CAD), and cardiac catheterization can show whether plaque is narrowing or blocking your hearts arteries. This procedure is also used to evaluate the valves, as well as measure the blood flow and oxygen levels in different parts of your heart. For further information please visit [email or web address] Please follow instruction sheet, as given.  You will have TAVR CT scans completed on 11/14/24. Please arrive by 2:15 PM on the 2nd floor. A letter is attached to this paperwork.  You will have a surgical consult for your TAVR workup. Our office will call you to schedule this appointment.   Follow-Up: At [facility], you and your health needs are our priority.  As part of our continuing mission to provide you with exceptional heart care, our providers are all part of one team.  This team includes your primary Cardiologist (physician) and Advanced Practice Providers or APPs (Physician Assistants and Nurse Practitioners) who all work together to provide you with the care you need, when you need it.  Your next appointment:   Per structural heart team   Other Instructions       Cardiac/Peripheral Catheterization   You are scheduled for a Cardiac Catheterization on Thursday, January 22 with Dr. [name].  1. Please arrive at the [facility] (Main Entrance A) at [facility]: [address] [place], [place] [address] at 5:30 AM (This time is 2 hour(s) before your  procedure to ensure your preparation). Your procedure is scheduled for 7:30 AM.  Free valet parking service is available. You will check in at ADMITTING. The support person will be asked to wait in the waiting room.  It is OK to have someone drop you off and come back when you are ready to be discharged.        Special note: Every effort is made to have your procedure done on time. Please understand that emergencies sometimes delay scheduled procedures.  2. Diet: Nothing to eat after midnight.  3. Hydration:You need to be well hydrated before your procedure. On January 22, you may drink approved liquids (see below) until 2 hours before the procedure, with 16 oz of water as your last intake.   List of approved liquids water, clear juice, clear tea, black coffee, fruit juices, non-citric and without pulp, carbonated beverages, Gatorade, Kool -Aid, plain Jello-O and plain ice popsicles.  4. Labs: You will need to have blood drawn on Wednesday, January 7 at [facility] (1st Floor), [address] [place], [place] [address]. You do not need to be fasting.  5. Medication instructions in preparation for your procedure:   Contrast Allergy: No  Stop taking, Lasix  (Furosemide )  Wednesday, January 21, last dose being on Tuesday January 20.  On the morning of your procedure, take Aspirin  81 mg and Plavix /Clopidogrel  and any morning medicines NOT listed above.  You may use sips of water.  6. Plan to go home the same day, you will only stay overnight if medically necessary. 7. You MUST have a responsible adult to drive you home. 8. An adult MUST be with you the first 24 hours after  you arrive home. 9. Bring a current list of your medications, and the last time and date medication taken. 10. Bring ID and current insurance cards. 11.Please wear clothes that are easy to get on and off and wear slip-on shoes.  Thank you for allowing us  to care for you!   --  [place] [facility]     Signed, [name], MD  11/13/2024 7:03 AM     HeartCare     [1]  Current Meds  Medication Sig   acetaminophen  (TYLENOL ) 325 MG tablet Take 2 tablets (650 mg total) by mouth every 6 (six) hours as needed for mild pain (pain score 1-3) (or Fever >/= 101).   amLODipine -olmesartan  (AZOR) 10-40 MG tablet Take 1 tablet by mouth every morning.   aspirin  EC 81 MG tablet Take 81 mg by mouth daily. Swallow whole.   Blood Pressure KIT 1 Units by Does not apply route daily. 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   clopidogrel  (PLAVIX ) 75 MG tablet Take 1 tablet (75 mg total) by mouth daily.   Continuous Glucose Receiver (FREESTYLE LIBRE READER) DEVI Use as directed to check blood glucose level   Continuous Glucose Sensor (FREESTYLE LIBRE 3 PLUS SENSOR) MISC Change sensor every 15 days.   ezetimibe  (ZETIA ) 10 MG tablet Take 1 tablet (10 mg total) by mouth daily.   furosemide  (LASIX ) 40 MG tablet Take 1.5 tablets (60 mg total) every other day, alternating with 1 tablet (40 mg total) on the other days for 1 week, then resume taking 1 tablet daily thereafter.   Lancets (ONETOUCH DELICA PLUS LANCET33G) MISC USE TO CHECK BLOOD SUGAR TWICE DAILY   nitroGLYCERIN  (NITROSTAT ) 0.4 MG SL tablet Place 1 tablet (0.4 mg total) under the tongue every 5 (five) minutes as needed for chest pain.   ONETOUCH VERIO test strip USE TO TEST 2 TIMES DAILY   pantoprazole  (PROTONIX ) 40 MG tablet Take 40 mg by mouth daily.   polyethylene glycol (MIRALAX  / GLYCOLAX ) 17 g packet Take 17 g by mouth daily as needed for moderate constipation.   potassium chloride  (KLOR-CON ) 10 MEQ tablet Take 10 mEq by mouth daily.   rosuvastatin  (CRESTOR ) 40 MG tablet Take 1 tablet (40 mg total) by mouth daily.   "

## 2024-11-05 NOTE — Progress Notes (Signed)
 " Heart and Vascular Care Navigation  11/05/2024  [name] December 04, 1940 [id number]  Reason for Referral: concerns with medical bills   Engaged with patient by telephone for initial visit for Heart and Vascular Care Coordination.                                                                                                   Assessment:                                     LCSW was able to reach pt at [phone]. Introduced self, role, reason for call. Confirmed home address, PCP, full name and DOB. 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 They do not have financial concerns related to food (dont receive SNAP and have food in house but okay with me sending other resources), utilities or housing but shares that the statements from recent medical care have built up and she's concerned about things going unpaid. We discussed options such as Haematologist program for hardship assessment on bills and connecting with [facility] team to see if they are eligible for Medicare Savings Program or Extra Help to assist with copay costs.   Pt appreciative of call, no additional questions. Okay with me sending resources to her via mail and f/u to ensure received/answer any other questions.   HRT/VAS Care Coordination     Patients Home Cardiology Office [facility]   Outpatient Care Team Social Worker   Social Worker Name: Marit Lark, [place], [phone]   Living arrangements for the past 2 months Single Family Home   Lives with: Spouse   Patient Current Insurance Coverage Managed Medicare   Patient Has Concern With Paying Medical Bills Yes   Patient Concerns With Medical Bills states she has recieved multiple bills from [facility] health and other providers   Medical Bill Referrals: discussed hardship program through [place] and how to connect with [facility] regarding other patient assistance options  through Medicare (extra help/SHIIP)   Does Patient Have Prescription Coverage? Yes   Home Assistive Devices/Equipment CPAP   DME Agency NA   [facility] Care       Social History:                                                                             SDOH Screenings   Food Insecurity: Food Insecurity Present (11/05/2024)  Housing: Low Risk (11/05/2024)  Transportation Needs: No Transportation Needs (11/05/2024)  Utilities: Not At Risk (11/05/2024)  Alcohol Screen: Low Risk (09/19/2023)  Depression (PHQ2-9): Low Risk (03/17/2024)  Financial Resource Strain: Medium Risk (11/05/2024)  Physical  Activity: Inactive (03/17/2024)  Social Connections: Socially Integrated (03/17/2024)  Stress: No Stress Concern Present (03/17/2024)  Tobacco Use: Low Risk (11/05/2024)  Health Literacy: Adequate Health Literacy (11/05/2024)    SDOH Interventions: Financial Resources:  Surveyor, Quantity Strain Interventions: Programmer, Applications Provided, Artist DSS for financial assistance and Editor, Commissioning for [employer] Program  Food Insecurity:  Food Insecurity Interventions: [employer] Resources Provided  Housing Insecurity:  Housing Interventions: Intervention Not Indicated  Transportation:   Transportation Interventions: Intervention Not Indicated     Other Care Navigation Interventions:     Provided Pharmacy assistance resources  Extra Help/LIS program; medication assistance guide   Follow-up plan:   LCSW sent pt the following: my card, [employer], Media Planner and information about [employer] program assistance for [employer], [employer] application. I spoke with [facility] financial counselor Rojelio, who shared that pt accounts are processing still with insurance at this time. There are a few copays she is responsible for that aren't eligible for assistance, should call back in about a month to see if they are processed yet. Several bills  from the last year or so that have gone to bad debt, unfortunately nothing we can do to assist with those.     "

## 2024-11-05 NOTE — Patient Instructions (Addendum)
 Medication Instructions:  No medication changes were made at this visit. Continue current regimen.   *If you need a refill on your cardiac medications before your next appointment, please call your pharmacy*  Lab Work: To be completed today: BMP and CBC  If you have labs (blood work) drawn today and your tests are completely normal, you will receive your results only by: MyChart Message (if you have MyChart) OR A paper copy in the mail If you have any lab test that is abnormal or we need to change your treatment, we will call you to review the results.  Testing/Procedures: Your physician has requested that you have a cardiac catheterization. Cardiac catheterization is used to diagnose and/or treat various heart conditions. Doctors may recommend this procedure for a number of different reasons. The most common reason is to evaluate chest pain. Chest pain can be a symptom of coronary artery disease (CAD), and cardiac catheterization can show whether plaque is narrowing or blocking your hearts arteries. This procedure is also used to evaluate the valves, as well as measure the blood flow and oxygen levels in different parts of your heart. For further information please visit [email or web address] Please follow instruction sheet, as given.  You will have TAVR CT scans completed on 11/14/24. Please arrive by 2:15 PM on the 2nd floor. A letter is attached to this paperwork.  You will have a surgical consult for your TAVR workup. Our office will call you to schedule this appointment.   Follow-Up: At [facility], you and your health needs are our priority.  As part of our continuing mission to provide you with exceptional heart care, our providers are all part of one team.  This team includes your primary Cardiologist (physician) and Advanced Practice Providers or APPs (Physician Assistants and Nurse Practitioners) who all work together to provide you with the care you need, when you need  it.  Your next appointment:   Per structural heart team   Other Instructions       Cardiac/Peripheral Catheterization   You are scheduled for a Cardiac Catheterization on Thursday, January 22 with Dr. [name].  1. Please arrive at the [facility] (Main Entrance A) at [facility]: [address] [place], [place] [address] at 5:30 AM (This time is 2 hour(s) before your procedure to ensure your preparation). Your procedure is scheduled for 7:30 AM.  Free valet parking service is available. You will check in at ADMITTING. The support person will be asked to wait in the waiting room.  It is OK to have someone drop you off and come back when you are ready to be discharged.        Special note: Every effort is made to have your procedure done on time. Please understand that emergencies sometimes delay scheduled procedures.  2. Diet: Nothing to eat after midnight.  3. Hydration:You need to be well hydrated before your procedure. On January 22, you may drink approved liquids (see below) until 2 hours before the procedure, with 16 oz of water as your last intake.   List of approved liquids water, clear juice, clear tea, black coffee, fruit juices, non-citric and without pulp, carbonated beverages, Gatorade, Kool -Aid, plain Jello-O and plain ice popsicles.  4. Labs: You will need to have blood drawn on Wednesday, January 7 at [facility] (1st Floor), [address] [place], [place] [address]. You do not need to be fasting.  5. Medication  instructions in preparation for your procedure:   Contrast Allergy: No  Stop taking, Lasix  (Furosemide )  Wednesday, January 21, last dose being on Tuesday January 20.  On the morning of your procedure, take Aspirin  81 mg and Plavix /Clopidogrel  and any morning medicines NOT listed above.  You may use sips of water.  6. Plan to go home the same day, you will only stay overnight if medically  necessary. 7. You MUST have a responsible adult to drive you home. 8. An adult MUST be with you the first 24 hours after you arrive home. 9. Bring a current list of your medications, and the last time and date medication taken. 10. Bring ID and current insurance cards. 11.Please wear clothes that are easy to get on and off and wear slip-on shoes.  Thank you for allowing us  to care for you!   -- [place] [facility]

## 2024-11-05 NOTE — Progress Notes (Signed)
 Pre Surgical Assessment: 5 M Walk Test  30M=16.31ft  5 Meter Walk Test- trial 1: 7.46 seconds 5 Meter Walk Test- trial 2: 7.19 seconds 5 Meter Walk Test- trial 3: 8.02 seconds 5 Meter Walk Test Average: 7.56 seconds

## 2024-11-06 LAB — BASIC METABOLIC PANEL WITH GFR
BUN/Creatinine Ratio: 18 (ref 12–28)
BUN: 20 mg/dL (ref 8–27)
CO2: 22 mmol/L (ref 20–29)
Calcium: 9.6 mg/dL (ref 8.7–10.3)
Chloride: 102 mmol/L (ref 96–106)
Creatinine, Ser: 1.1 mg/dL — ABNORMAL HIGH (ref 0.57–1.00)
Glucose: 104 mg/dL — ABNORMAL HIGH (ref 70–99)
Potassium: 4.3 mmol/L (ref 3.5–5.2)
Sodium: 141 mmol/L (ref 134–144)
eGFR: 50 mL/min/1.73 — ABNORMAL LOW

## 2024-11-06 LAB — CBC
Hematocrit: 45.1 % (ref 34.0–46.6)
Hemoglobin: 14.1 g/dL (ref 11.1–15.9)
MCH: 27.9 pg (ref 26.6–33.0)
MCHC: 31.3 g/dL — ABNORMAL LOW (ref 31.5–35.7)
MCV: 89 fL (ref 79–97)
Platelets: 190 x10E3/uL (ref 150–450)
RBC: 5.06 x10E6/uL (ref 3.77–5.28)
RDW: 13.3 % (ref 11.7–15.4)
WBC: 7.7 x10E3/uL (ref 3.4–10.8)

## 2024-11-13 NOTE — Assessment & Plan Note (Signed)
 BP Controlled

## 2024-11-13 NOTE — Progress Notes (Signed)
 [name]

## 2024-11-14 ENCOUNTER — Ambulatory Visit (HOSPITAL_COMMUNITY)
Admission: RE | Admit: 2024-11-14 | Discharge: 2024-11-14 | Disposition: A | Discharge status: Home / Self Care | Source: Ambulatory Visit | Attending: Cardiology | Admitting: Cardiology

## 2024-11-14 DIAGNOSIS — I10 Essential (primary) hypertension: Secondary | ICD-10-CM | POA: Diagnosis present

## 2024-11-14 DIAGNOSIS — I350 Nonrheumatic aortic (valve) stenosis: Secondary | ICD-10-CM | POA: Diagnosis not present

## 2024-11-14 DIAGNOSIS — I2510 Atherosclerotic heart disease of native coronary artery without angina pectoris: Secondary | ICD-10-CM | POA: Insufficient documentation

## 2024-11-14 DIAGNOSIS — Z01812 Encounter for preprocedural laboratory examination: Secondary | ICD-10-CM | POA: Diagnosis present

## 2024-11-14 MED ORDER — IOHEXOL 350 MG/ML SOLN
100.0000 mL | Freq: Once | INTRAVENOUS | Status: AC | PRN
  Administered 2024-11-14: 100 mL via INTRAVENOUS

## 2024-11-16 ENCOUNTER — Ambulatory Visit: Payer: Self-pay | Admitting: Cardiovascular Disease

## 2024-11-17 ENCOUNTER — Telehealth: Payer: Self-pay | Admitting: Cardiovascular Disease

## 2024-11-17 NOTE — Telephone Encounter (Signed)
 Slater with [facility] calling with call report for Dr. Wonda or his assist. She states she needs a c/b even if report has already been seen. Please advise.   [phone]

## 2024-11-17 NOTE — Progress Notes (Signed)
 Procedure Type: Isolated AVR Perioperative Outcome Estimate % Operative Mortality 9% Morbidity & Mortality 19.6% Stroke 2.43% Renal Failure 2.82% Reoperation 4.32% Prolonged Ventilation 16.1% Deep Sternal Wound Infection 0.088% Long Hospital Stay (>14 days) 12.7% Short Hospital Stay (<6 days)* 14.1%

## 2024-11-17 NOTE — Telephone Encounter (Signed)
 Called and spoke to staff at [facility] regarding the CT Angio ABD Pelvis (11/14/24). They would like to notify Dr. Wonda of the results.   Copy and pasted from report: IMPRESSION: 1. Minimal vessel diameters detailed above. 2. Indeterminate densities are present adjacent to the right kidney as well as a indeterminate cystic lesion involving the medial right kidney. Recommend MRI of the abdomen with and without contrast for further characterization. 3. Cholelithiasis.

## 2024-11-18 ENCOUNTER — Telehealth: Payer: Self-pay | Admitting: *Deleted

## 2024-11-18 ENCOUNTER — Telehealth (HOSPITAL_BASED_OUTPATIENT_CLINIC_OR_DEPARTMENT_OTHER): Payer: Self-pay | Admitting: Licensed Clinical Social Worker

## 2024-11-18 NOTE — Telephone Encounter (Signed)
 H&V Care Navigation CSW Progress Note  Clinical Social Worker contacted patient by phone to f/u on resources sent to her. Reached her at [phone]. Confirmed she received resources in the mail. Explained how to contact billing to clarify what debts she owes to [facility]. Explained that some statements may be explanation of benefits or insurance statements- the billing department for [facility] and any other practices she sees (PCP team, specialists) will be able to clarify exactly what is owed. CAFA does not discount copays and therefore pt may still owe those. Pt took down notes and number and will call. Has CAFA application if can assist with bills (only past 240 days of bills are eligible if outstanding/not copays). Also discussed SHIIP should they be eligible for additional assistance through [employer].   Pt inquiring about cath lab instructions- will f/u with Arlean Lipps, RN to ensure she is called and asked her to keep her phone near by.   Patient is participating in a Managed Medicaid Plan:  No, [facility] Medicare  SDOH Screenings   Food Insecurity: Food Insecurity Present (11/05/2024)  Housing: Low Risk (11/05/2024)  Transportation Needs: No Transportation Needs (11/05/2024)  Utilities: Not At Risk (11/05/2024)  Alcohol Screen: Low Risk (09/19/2023)  Depression (PHQ2-9): Low Risk (03/17/2024)  Financial Resource Strain: Medium Risk (11/05/2024)  Physical Activity: Inactive (03/17/2024)  Social Connections: Socially Integrated (03/17/2024)  Stress: No Stress Concern Present (03/17/2024)  Tobacco Use: Low Risk (11/05/2024)  Health Literacy: Adequate Health Literacy (11/05/2024)    Marit Lark, MSW, LCSW Clinical Social Worker II [facility] Heart/Vascular Care Navigation  [phone]- work cell phone (preferred)

## 2024-11-18 NOTE — Telephone Encounter (Addendum)
 Cardiac Catheterization scheduled at [facility] for: Thursday November 20, 2024 7:30 AM Arrival time [facility] Main Entrance A at: 5:30 AM  Diet: -Nothing to eat after midnight.  Hydration: -May drink clear liquids until 2 hours before the procedure.  Approved liquids: Water , clear tea, black coffee, fruit juices-non-citric and without pulp,Gatorade, plain Jello/popsicles.   -Please drink 8 oz of water  2 hours before procedure.  Medication instructions: -Hold:  Lasix /KCl-day before and day of procedure per protocol GFR < 60 (50)  Azor-day before and day of procedure-per protocol GFR < 60 (50)-( patient tells me she is not currently taking) -Other usual morning medications can be taken including aspirin  81 mg and Plavix  75 mg (pt tells me she is not currently taking)  Plan to go home the same day, you will only stay overnight if medically necessary.  You must have responsible adult to drive you home.  Someone must be with you the first 24 hours after you arrive home.  Left message for patient to call back to review procedure instructions

## 2024-11-18 NOTE — Telephone Encounter (Addendum)
 Reviewed procedure instructions with patient.

## 2024-11-20 ENCOUNTER — Other Ambulatory Visit: Payer: Self-pay

## 2024-11-20 ENCOUNTER — Telehealth: Payer: Self-pay

## 2024-11-20 ENCOUNTER — Encounter (HOSPITAL_COMMUNITY)
Admission: RE | Disposition: A | Discharge status: Home / Self Care | Payer: Self-pay | Source: Home / Self Care | Attending: Cardiovascular Disease

## 2024-11-20 ENCOUNTER — Ambulatory Visit (HOSPITAL_COMMUNITY)
Admission: RE | Admit: 2024-11-20 | Discharge: 2024-11-20 | Disposition: A | Discharge status: Home / Self Care | Attending: Cardiovascular Disease | Admitting: Cardiovascular Disease

## 2024-11-20 DIAGNOSIS — I110 Hypertensive heart disease with heart failure: Secondary | ICD-10-CM | POA: Insufficient documentation

## 2024-11-20 DIAGNOSIS — Z79899 Other long term (current) drug therapy: Secondary | ICD-10-CM | POA: Diagnosis not present

## 2024-11-20 DIAGNOSIS — Z7902 Long term (current) use of antithrombotics/antiplatelets: Secondary | ICD-10-CM | POA: Insufficient documentation

## 2024-11-20 DIAGNOSIS — Z7982 Long term (current) use of aspirin: Secondary | ICD-10-CM | POA: Insufficient documentation

## 2024-11-20 DIAGNOSIS — I350 Nonrheumatic aortic (valve) stenosis: Secondary | ICD-10-CM | POA: Diagnosis present

## 2024-11-20 DIAGNOSIS — I2510 Atherosclerotic heart disease of native coronary artery without angina pectoris: Secondary | ICD-10-CM | POA: Insufficient documentation

## 2024-11-20 DIAGNOSIS — I447 Left bundle-branch block, unspecified: Secondary | ICD-10-CM | POA: Insufficient documentation

## 2024-11-20 DIAGNOSIS — I5022 Chronic systolic (congestive) heart failure: Secondary | ICD-10-CM | POA: Insufficient documentation

## 2024-11-20 DIAGNOSIS — Z8673 Personal history of transient ischemic attack (TIA), and cerebral infarction without residual deficits: Secondary | ICD-10-CM | POA: Diagnosis not present

## 2024-11-20 LAB — POCT I-STAT EG7
Acid-base deficit: 1 mmol/L (ref 0.0–2.0)
Bicarbonate: 25 mmol/L (ref 20.0–28.0)
Calcium, Ion: 1.21 mmol/L (ref 1.15–1.40)
HCT: 37 % (ref 36.0–46.0)
Hemoglobin: 12.6 g/dL (ref 12.0–15.0)
O2 Saturation: 69 %
Potassium: 4 mmol/L (ref 3.5–5.1)
Sodium: 138 mmol/L (ref 135–145)
TCO2: 26 mmol/L (ref 22–32)
pCO2, Ven: 45.4 mmHg (ref 44–60)
pH, Ven: 7.349 (ref 7.25–7.43)
pO2, Ven: 38 mmHg (ref 32–45)

## 2024-11-20 LAB — GLUCOSE, CAPILLARY
Glucose-Capillary: 113 mg/dL — ABNORMAL HIGH (ref 70–99)
Glucose-Capillary: 124 mg/dL — ABNORMAL HIGH (ref 70–99)

## 2024-11-20 LAB — POCT I-STAT 7, (LYTES, BLD GAS, ICA,H+H)
Acid-base deficit: 1 mmol/L (ref 0.0–2.0)
Bicarbonate: 24.1 mmol/L (ref 20.0–28.0)
Calcium, Ion: 1.23 mmol/L (ref 1.15–1.40)
HCT: 38 % (ref 36.0–46.0)
Hemoglobin: 12.9 g/dL (ref 12.0–15.0)
O2 Saturation: 89 %
Potassium: 4 mmol/L (ref 3.5–5.1)
Sodium: 138 mmol/L (ref 135–145)
TCO2: 25 mmol/L (ref 22–32)
pCO2 arterial: 42.6 mmHg (ref 32–48)
pH, Arterial: 7.362 (ref 7.35–7.45)
pO2, Arterial: 60 mmHg — ABNORMAL LOW (ref 83–108)

## 2024-11-20 MED ORDER — FREE WATER: 250.0000 mL | Freq: Once | Status: DC

## 2024-11-20 MED ORDER — ONDANSETRON HCL 4 MG/2ML IJ SOLN: 4.0000 mg | Freq: Four times a day (QID) | INTRAMUSCULAR | Status: DC | PRN

## 2024-11-20 MED ORDER — LIDOCAINE HCL (PF) 1 % IJ SOLN
INTRAMUSCULAR | Status: AC
  Filled 2024-11-20: qty 30

## 2024-11-20 MED ORDER — FENTANYL CITRATE (PF) 100 MCG/2ML IJ SOLN
INTRAMUSCULAR | Status: AC
  Filled 2024-11-20: qty 2

## 2024-11-20 MED ORDER — SODIUM CHLORIDE 0.9% FLUSH: 3.0000 mL | INTRAVENOUS | Status: DC | PRN

## 2024-11-20 MED ORDER — CLOPIDOGREL BISULFATE 75 MG PO TABS: 75.0000 mg | ORAL_TABLET | ORAL | Status: DC

## 2024-11-20 MED ORDER — HYDRALAZINE HCL 20 MG/ML IJ SOLN: 10.0000 mg | INTRAMUSCULAR | Status: DC | PRN

## 2024-11-20 MED ORDER — LABETALOL HCL 5 MG/ML IV SOLN: 10.0000 mg | INTRAVENOUS | Status: DC | PRN

## 2024-11-20 MED ORDER — SODIUM CHLORIDE 0.9 % IV SOLN: 250.0000 mL | INTRAVENOUS | Status: DC | PRN

## 2024-11-20 MED ORDER — HEPARIN SODIUM (PORCINE) 1000 UNIT/ML IJ SOLN
INTRAMUSCULAR | Status: AC
  Filled 2024-11-20: qty 10

## 2024-11-20 MED ORDER — HEPARIN SODIUM (PORCINE) 1000 UNIT/ML IJ SOLN
INTRAMUSCULAR | Status: DC | PRN
  Administered 2024-11-20: 5000 [IU] via INTRAVENOUS

## 2024-11-20 MED ORDER — HEPARIN (PORCINE) IN NACL 1000-0.9 UT/500ML-% IV SOLN
INTRAVENOUS | Status: DC | PRN
  Administered 2024-11-20: 1000 mL

## 2024-11-20 MED ORDER — VERAPAMIL HCL 2.5 MG/ML IV SOLN
INTRAVENOUS | Status: DC | PRN
  Administered 2024-11-20: 10 mL via INTRA_ARTERIAL

## 2024-11-20 MED ORDER — ACETAMINOPHEN 325 MG PO TABS: 650.0000 mg | ORAL_TABLET | ORAL | Status: DC | PRN

## 2024-11-20 MED ORDER — LIDOCAINE HCL (PF) 1 % IJ SOLN
INTRAMUSCULAR | Status: DC | PRN
  Administered 2024-11-20: 2 mL

## 2024-11-20 MED ORDER — FENTANYL CITRATE (PF) 100 MCG/2ML IJ SOLN
INTRAMUSCULAR | Status: DC | PRN
  Administered 2024-11-20: 25 ug via INTRAVENOUS

## 2024-11-20 MED ORDER — ASPIRIN 81 MG PO CHEW: 81.0000 mg | CHEWABLE_TABLET | ORAL | Status: AC

## 2024-11-20 MED ORDER — MIDAZOLAM HCL 2 MG/2ML IJ SOLN
INTRAMUSCULAR | Status: AC
  Filled 2024-11-20: qty 2

## 2024-11-20 MED ORDER — MIDAZOLAM HCL (PF) 2 MG/2ML IJ SOLN
INTRAMUSCULAR | Status: DC | PRN
  Administered 2024-11-20: 1 mg via INTRAVENOUS

## 2024-11-20 MED ORDER — IOHEXOL 350 MG/ML SOLN
INTRAVENOUS | Status: DC | PRN
  Administered 2024-11-20: 65 mL

## 2024-11-20 MED ORDER — METOPROLOL SUCCINATE ER 25 MG PO TB24: 12.5000 mg | ORAL_TABLET | Freq: Two times a day (BID) | ORAL | 3 refills | Status: AC

## 2024-11-20 MED ORDER — SODIUM CHLORIDE 0.9% FLUSH: 3.0000 mL | Freq: Two times a day (BID) | INTRAVENOUS | Status: DC

## 2024-11-20 MED ORDER — SACUBITRIL-VALSARTAN 24-26 MG PO TABS: 1.0000 | ORAL_TABLET | Freq: Two times a day (BID) | ORAL | 3 refills | Status: DC

## 2024-11-20 MED ORDER — VERAPAMIL HCL 2.5 MG/ML IV SOLN
INTRAVENOUS | Status: AC
  Filled 2024-11-20: qty 2

## 2024-11-20 NOTE — Telephone Encounter (Signed)
 Spoke with pt and explained that Dr. Okey would like for her to come into the office to see her on Thursday 1/29 at 12 PM. Pt agreed and said she wanted to come into to talk about medications. Pt asked for a MyChart message to be sent with medication changes d/t not being able to use her right wrist after her cath today. MyChart message has been sent.  Dr. Okey recommendations:  Recomm:  Stop amlodipine /olmesartan  I would add Entresto  24/26 bid Add Toprol  XL 25 mg (start at 12.5 bid--  1/2 tab)

## 2024-11-20 NOTE — Progress Notes (Signed)
 Reviewed with Dr Wonda   PT will be undergoing evaluation for TAVR    He/ I would like medicines optimized given pumping function down some   Recomm:  Stop amlodipine /olmesartan  I would add Entresto  24/26 bid Add Toprol  XL 25 mg (start at 12.5 bid--  1/2 tab)  I am in clinic on Thurs  Please add on to this app   She can get labs checked then as well

## 2024-11-20 NOTE — Interval H&P Note (Signed)
 History and Physical Interval Note:  11/20/2024 7:52 AM  [name]  has presented today for surgery, with the diagnosis of aortic stenosis.  The various methods of treatment have been discussed with the patient and family. After consideration of risks, benefits and other options for treatment, the patient has consented to  Procedures: RIGHT/LEFT HEART CATH AND CORONARY ANGIOGRAPHY (N/A) as a surgical intervention.  The patient's history has been reviewed, patient examined, no change in status, stable for surgery.  I have reviewed the patient's chart and labs.  Questions were answered to the patient's satisfaction.     [name]

## 2024-11-21 ENCOUNTER — Telehealth: Payer: Self-pay | Admitting: Internal Medicine

## 2024-11-21 ENCOUNTER — Encounter (HOSPITAL_COMMUNITY): Payer: Self-pay | Admitting: Cardiovascular Disease

## 2024-11-21 NOTE — Telephone Encounter (Signed)
 Returned patient call regarding heart cath questions. Patient asking about pressure dressing at radial site. Per patient, gauze, pressure tape and board wrapped around hand was placed after cath yesterday. Advised patient she could remove after 24 hours and to just place a band-aid at site. Pt and husband verbalized understanding of plan.

## 2024-11-21 NOTE — Telephone Encounter (Signed)
 Pt has a cath procedure done yesterday and has questions about the after care. Please advise

## 2024-11-27 ENCOUNTER — Ambulatory Visit: Admitting: Internal Medicine

## 2024-11-28 ENCOUNTER — Ambulatory Visit: Attending: Internal Medicine | Admitting: Internal Medicine

## 2024-11-28 ENCOUNTER — Other Ambulatory Visit (HOSPITAL_COMMUNITY): Payer: Self-pay

## 2024-11-28 ENCOUNTER — Encounter: Payer: Self-pay | Admitting: Internal Medicine

## 2024-11-28 VITALS — BP 108/68 | HR 77 | Resp 17 | Ht 67.0 in | Wt 219.0 lb

## 2024-11-28 DIAGNOSIS — I5020 Unspecified systolic (congestive) heart failure: Secondary | ICD-10-CM | POA: Diagnosis not present

## 2024-11-28 DIAGNOSIS — I350 Nonrheumatic aortic (valve) stenosis: Secondary | ICD-10-CM | POA: Diagnosis not present

## 2024-11-28 DIAGNOSIS — I10 Essential (primary) hypertension: Secondary | ICD-10-CM

## 2024-11-28 DIAGNOSIS — Z79899 Other long term (current) drug therapy: Secondary | ICD-10-CM

## 2024-11-28 MED ORDER — CLOPIDOGREL BISULFATE 75 MG PO TABS
75.0000 mg | ORAL_TABLET | Freq: Every day | ORAL | 2 refills | Status: AC
  Filled 2024-11-28: qty 90, 90d supply, fill #0

## 2024-11-28 MED ORDER — SACUBITRIL-VALSARTAN 24-26 MG PO TABS
1.0000 | ORAL_TABLET | Freq: Two times a day (BID) | ORAL | 5 refills | Status: AC
  Filled 2024-11-28 (×2): qty 60, 30d supply, fill #0

## 2024-11-28 NOTE — Patient Instructions (Addendum)
 Medication Instructions:    Stop taking Amlodipine  -olmesartan --TABLET   Start taking Entresto  24/26 mg  twice a day - new   Only take the medication listed below:  Potassium  (Klor Con)  10 meq  daily  Protonix ( pantoprazole )  40 mg daily  Aspirin   81 mg daily Rosuvastatin  40 mg daily Ezemtimbe (Zetia ) 10 mg  daily  Furosemide  ( Lasix ) 40 mg  daily   Vitamin D  3  2000 mg daily  Clopidogrel  ( Plavix )  75 mg  daily Tradozone 50 mg  as needed at bedtime     Do not take any other medication  until you come back for your appointment  *If you need a refill on your cardiac medications before your next appointment, please call your pharmacy*   Lab Work: Not  needed If you have labs (blood work) drawn today and your tests are completely normal, you will receive your results only by: MyChart Message (if you have MyChart) OR A paper copy in the mail If you have any lab test that is abnormal or we need to change your treatment, we will call you to review the results.   Testing/Procedures: Not needed    Follow-Up: At [facility], you and your health needs are our priority.  As part of our continuing mission to provide you with exceptional heart care, we have created designated Provider Care Teams.  These Care Teams include your primary Cardiologist (physician) and Advanced Practice Providers (APPs -  Physician Assistants and Nurse Practitioners) who all work together to provide you with the care you need, when you need it.     Your next appointment:   2 week(s)  The format for your next appointment:   In Person  Provider:   Pharmacist  or Dr Okey   Other Instructions

## 2024-11-28 NOTE — Progress Notes (Signed)
 "   Cardiology Office Note   Date:  10/17/2024  ID:  [name], DOB 10/30/1941, MRN [id number]  PCP:  Arloa Jarvis, NP  Cardiologist:   Vina Gull, MD   Pt presents for follow up of CAD and AS  History of Present Illness: [name] is a 84 y.o. female with a history of CAD, AS HTN, HL, LBBB,  TIA,, ICH    2018  Myoview   Normal perfusion  June 2023 Echo LVEF 50 to 55%  Severe MAC  Mild AS Dec 2023 Pt admitted with ICH April 2024  Echo  No change from echo done in  2023  -May 2025  I saw the pt in consult for elevated troponin.   (294, 224)  Echo showed LVEF 50 to 55% Inferior hypokinesis(old)  Mod AS -May 2025  Pt with TIA in hospital  Found to have severe carotid dz -Mar 06, 2024  Lexiscan  myoview  done   No ischemia  Fixed anteroseptal defect LVEF 38%  May 2025 Echo LVEF 50 to 55%  Mild MS  Mod AS (mean gradient 23 mm Hg) Mar 07, 2024  Underwent PTA/Stent to L ICA Mar 21, 2024  Zio patch  SR  Occasional PVCs (7.1% total) Mar 28, 2024  CCTA   Ca score 1965   50% LM; 50 to 70% prox and mid RCA, 25 to 49% prox/mid/distal LAD, prox LCx, Distal RCA, D2, D3   AV calcium  score 1619  Severe MAC FFR positive in LM and mid RCA    -April 04, 2024   LHC showed 50% mLAD; 50% pRCA and mid RCA   LVEDP normal  No AS   Plan for medical Rx   October 2025  Pt went to beach with family  Nov 2025  Seen in ER for cough, SOB, difficulty sleeping   Began about 3 wks prior to this ER visit.(Did not have last spring)  Found to have CHF/pulmonary edema  Rx with diuretics in ER and sent home on lasix   Sep 20, 2024   Went to ER with N/V, diarrhea.  Edema improved  Still with orthopnea  I saw the pt in December   Pt still SOB, fatigued      Dec 2025  Echo LVEF 20 to 25%   AV mean gradient 16.5  My concern was that AS was underestimated Pt referred to structural team  11/14/24  CCTA   Ca score of valve 2540 Nov 20, 2024   Nonobstructive CAD   LVEF 35 to 40%  LVEDP 9  PA mean 17  PCWP 11  CI 3.5  AV  mean gradient 29   Calculated valve area of 1.3  Plan to review with multidisciplinary team  Pt comes in today with her husband   Her breathing is better than when I saw her in December but she does get winded still Ankles are not swollen    She denies CP Does complain of some dizziness NO syncope  Very confused by meds    She did not start Entresto  or Toprol    Active Medications[1]   Allergies:   Bee venom, Latex, Tape, Codeine, Hydrocodone, and Oxycodone    Past Medical History:  Diagnosis Date   Abdominal bloating 02/17/2019   Abdominal pain    AKI (acute kidney injury) 02/12/2023   Arthritis    cervical disc degeneration/ oa left knee, carpal tunnel rt wrist, adhesive capsulitis right shoulder, rt hand weakness; lumbar degeneration   Carpal tunnel syndrome  CARPAL TUNNEL SYNDROME, RIGHT 10/14/2010   Qualifier: Diagnosis of   By: Eyvonne MD, Debby ORN        CHEST PAIN, ATYPICAL 07/01/2010   Qualifier: Diagnosis of   By: Domenica MD, Harlene Cahill acquired pneumonia 05/06/2020   Complication of anesthesia 2006-at [facility]   breathing problems-no BP med given prior to surgery;  hx of being very sleepy after colon surgery --  states no problems with last right total knee replacement 2013   Constipation    Diabetes mellitus without complication (HCC)    borderline - diet control   Diverticulitis hx of   Diverticulosis    E. coli infection    2020   Frequent UTI    hx of urethral injury during colon surgery - states frequent uti's since   GERD (gastroesophageal reflux disease)    Gout, unspecified 09/09/2007   Qualifier: Diagnosis of   By: Mavis MD, Norleen BRAVO        H. pylori infection 07/18/2010   H/O hiatal hernia    HIP PAIN, LEFT, CHRONIC 11/09/2010   Qualifier: Diagnosis of   By: Mavis MD, John E        History of colon polyps 08/26/2008   History of palpitations    in the past   History of shingles    has a lingering itching on back where shingles were    Hyperlipidemia    Hypertension    Hypokalemia 10/19/2022   KNEE PAIN, RIGHT, CHRONIC 10/14/2010   Qualifier: Diagnosis of   By: Eyvonne MD, Debby ORN        Lightheaded 02/17/2019   Morbidly obese (HCC) 05/31/2011   bmi of 44     Numbness and tingling in right hand    pt. 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   Shortness of breath 09/14/2021   Shortness of breath 09/14/2021   Spasm of lumbar paraspinous muscle 06/17/2011   Stroke (HCC)    Stroke-like symptom    Subacute intracranial hemorrhage (HCC) 10/18/2022   Transient speech disturbance 04/18/2022   Urinary frequency 02/17/2019   Vaginal discharge 08/02/2011   Vitamin D  deficiency     Past Surgical History:  Procedure Laterality Date   ABDOMINAL HYSTERECTOMY     bladder tack     BREAST BIOPSY Left    BREAST SURGERY     breast duct resection- benign   CARPAL TUNNEL RELEASE Right 06/04/2014   Procedure: RIGHT CARPAL TUNNEL RELEASE;  Surgeon: Elsie Mussel, MD;  Location: [facility];  Service: Orthopedics;  Laterality: Right;   COLON RESECTION  2008   hx diverticulosis   COLON SURGERY     COLONOSCOPY     colonoscopy [id number]     JOINT REPLACEMENT     LEFT HEART CATH AND CORONARY ANGIOGRAPHY N/A 04/15/2024   Procedure: LEFT HEART CATH AND CORONARY ANGIOGRAPHY;  Surgeon: Anner Alm ORN, MD;  Location: [facility] INVASIVE CV LAB;  Service: Cardiovascular;  Laterality: N/A;   OOPHORECTOMY     POLYPECTOMY     RIGHT/LEFT HEART CATH AND CORONARY ANGIOGRAPHY N/A 11/20/2024   Procedure: RIGHT/LEFT  HEART CATH AND CORONARY ANGIOGRAPHY;  Surgeon: Wonda Sharper, MD;  Location: [facility] INVASIVE CV LAB;  Service: Cardiovascular;   Laterality: N/A;   skin graft left arm - traumatic compression injury left upper arm     temporary ureter stent     TOTAL KNEE ARTHROPLASTY  01/05/2012   Procedure: TOTAL KNEE ARTHROPLASTY;  Surgeon: Dempsey LULLA Moan, MD;  Location: [facility];  Service: Orthopedics;  Laterality: Right;   TOTAL KNEE ARTHROPLASTY Left 08/17/2014   Procedure: LEFT TOTAL KNEE ARTHROPLASTY;  Surgeon: Dempsey Moan LULLA, MD;  Location: [facility];  Service: Orthopedics;  Laterality: Left;   TRANSCAROTID ARTERY REVASCULARIZATION  Left 03/07/2024   Procedure: TRANSCAROTID ARTERY REVASCULARIZATION (TCAR);  Surgeon: Serene Gaile ORN, MD;  Location: [facility] OR;  Service: Vascular;  Laterality: Left;   ureter repair for tyransected left ureter       Social History:  The patient  reports that she has never smoked. She has never used smokeless tobacco. She reports that she does not drink alcohol and does not use drugs.   Family History:  The patient's family history includes Arthritis in her sister and sister; Breast cancer (age of onset: 73) in her mother; COPD in her brother; Cancer in her maternal grandmother; Dementia in her sister; Hypertension in her brother, child, child, child, father, mother, and sister; Lung cancer in her brother; Prostate cancer in her father.    ROS:  Please see the history of present illness. All other systems are reviewed and  Negative to the above problem except as noted.    PHYSICAL EXAM: VS:  BP 108/68 (BP Location: Left Arm, Patient Position: Sitting, Cuff Size: Large)   Pulse 77   Resp 17   Ht 5' 7 (1.702 m)   Wt 219 lb (99.3 kg)   SpO2 97%   BMI 34.30 kg/m   GEN: Obese 84 yo  in no acute distress  HEENT: normal  Neck: no JVD, carotid bruits Cardiac: RRR; I-II/VI systolic murmur base   Respiratory:  clear to auscultation  No wheezes or rales  GI: soft, nontender No hepatomegaly  Ext  Tr LE edema     EKG:  EKG is not  ordered today.  R/L heart cath Nov 20, 2024  1.  Nonobstructive  coronary artery disease as detailed above.  No significant change since previous heart catheterization. 2.  Moderately severe LV dysfunction with LVEF estimated at 35 to 40%, normal LVEDP 9 mmHg 3.  Right heart hemodynamics: RA mean 2 mmHg RV 27/4 mmHg PA 39/12 mean 17 mmHg Wedge pressure A-wave 16, V wave 14, mean 11 mmHg Cardiac output/cardiac index 7.3/3.5 Aortic valve hemodynamics: Peak to peak gradient 36 mmHg, mean gradient 29 mmHg, calculated valve area 1.3 cm   Plan: Will review patient's case in the multidisciplinary heart team meeting to consider TAVR.  Will arrange outpatient cardiology follow-up to optimize her GDMT.  Echo  Dec 2026  1. Left ventricular ejection fraction, by estimation, is 20 to 25%. The  left ventricle has severely decreased function. The left ventricle  demonstrates global hypokinesis. Left ventricular diastolic parameters are  consistent with Grade II diastolic  dysfunction (pseudonormalization). Elevated left ventricular end-diastolic  pressure.   2. Right ventricular systolic function is normal. The right ventricular  size is normal.   3. Left atrial size was mildly dilated.   4. The mitral valve is degenerative. Mild mitral valve regurgitation.  Mild mitral stenosis. The mean mitral valve gradient is 3.5 mmHg.   5.  The aortic valve is calcified. Aortic valve regurgitation is not  visualized. Mild aortic valve stenosis. Aortic valve mean gradient  measures 16.5 mmHg. Aortic valve Vmax measures 2.65 m/s.   6. The inferior vena cava is normal in size with greater than 50%  respiratory variability, suggesting right atrial pressure of 3 mmHg.   Comparison(s): A prior study was performed on 02/2024. The left ventricular  function is worsened.   LHC  June 2025    Mid LAD lesion is 50% stenosed.   Prox RCA to Mid RCA lesion is 50% stenosed with 50% stenosed side branch in RV Branch.   LV end diastolic pressure is normal.   There is no aortic valve  stenosis.   In the absence of any other complications or medical issues, we expect the patient to be ready for discharge from a cath perspective on 04/15/2024.   Continue aspirin  Plavix  per carotid stent, following planned duration of dual antiplatelet therapy, would reduce to monotherapy based on [facility] (Dr. Serene) recommendations.    CCTA with FFR  May/June 2025 1. CTFFR suggests obstructive CAD in left main and mid RCA, and is borderline in mid LAD. Cardiac catheterization is recommended   IMPRESSION: 1. Coronary calcium  score of 1965. This was 98th percentile for age-, sex, and race-matched controls.  2. Total plaque volume 1840mm3 which is 96th percentile for age- and sex-matched controls (calcified plaque 555mm3; non-calcified plaque 1278mm3). TPV is extensive  3.  Normal coronary origin with right dominance.  4.  Normal coronary arteries.  5.  Mixed plaque in left main causes ~50% stenosis  6.  Moderate (50-69%) stenosis in proximal and mid RCA  7. Mild (25-49%) stenosis in proximal/mid/distal LAD, proximal LCX, distal RCA, D2, and D3  8.  Severe aortic valve calcifications (AV calcium  score 1619)  9.  Severe mitral annular calcifications    Echo  May 2025  1. Left ventricular ejection fraction, by estimation, is 50 to 55%. The  left ventricle has low normal function. The left ventricle has no regional  wall motion abnormalities. There is moderate concentric left ventricular  hypertrophy. Left ventricular  diastolic function could not be evaluated. There is nonspecific abnormal  septal motion.   2. Right ventricular systolic function is normal. The right ventricular  size is normal. There is normal pulmonary artery systolic pressure.   3. The mitral valve is degenerative. Trivial mitral valve regurgitation.  Mild mitral stenosis. Severe mitral annular calcification.   4. The aortic valve is calcified. There is moderate calcification of the  aortic valve. There is moderate  thickening of the aortic valve. Aortic  valve regurgitation is not visualized. Moderate aortic valve stenosis.  Aortic valve mean gradient measures  23.0 mmHg.   5. The inferior vena cava is normal in size with greater than 50%  respiratory variability, suggesting right atrial pressure of 3 mmHg.   Comparison(s): Prior images reviewed side by side.   Conclusion(s)/Recommendation(s): LVEF o  Dominance: Right Lipid Panel    Component Value Date/Time   CHOL 168 03/05/2024 0516   CHOL 171 03/01/2022 1108   TRIG 195 (H) 03/05/2024 0516   HDL 39 (L) 03/05/2024 0516   HDL 39 (L) 03/01/2022 1108   CHOLHDL 4.3 03/05/2024 0516   VLDL 39 03/05/2024 0516   LDLCALC 90 03/05/2024 0516   LDLCALC 103 (H) 03/01/2022 1108   LDLDIRECT 162.2 11/21/2013 1135      Wt Readings from Last 3 Encounters:  11/28/24 219 lb (99.3 kg)  11/20/24 221 lb (100.2 kg)  11/05/24 222 lb (100.7 kg)      ASSESSMENT AND PLAN:  1 HFrEF   Pt with severe LV dysfunction on most recent echo   Very different from May 2025  LHC shows no significant Dz   Concern that AS may be contributing  Volume status appears better tahn when I saw her last time in December She has not started Entresto  or Toprol  She is still on amlodipine  / olmesartan    Does have some dizziness  The nurse and I spent a long time with patient (over 30 min) going over meds, sorting for her    Daughter on Phone during this time  Would stop amllodipine/olmesartan  Start Entresto  24/26 bid  Hold on other meds with dizziness Keep on other meds   Follow up in a couple weeks to titrate, check BP    2  Aortic stenosis   Pt has undergone full eval for TAVR   To be reviewed by their multidisciplinary team for possible TAVR   2  HTN  BP 108 today  Making changes noted above in meds   Follow   3 CAD   NOnobstructive CAD at cath a few wks ago    4 HL Pt on Crestor  and Zetia   In May LDL 90  HDL 39  Trig 195    Keep on meds   Labs will need to be checked  again    May need to switch to Repatha  5  DM  A1C 7.7   Reviewed diet  Needs to cut out carbs, sweets.    Current medicines are reviewed at length with the patient today.  The patient does not have concerns regarding medicines.  Signed, Vina Gull, MD      [1]  Current Meds  Medication Sig   acetaminophen  (TYLENOL ) 325 MG tablet Take 2 tablets (650 mg total) by mouth every 6 (six) hours as needed for mild pain (pain score 1-3) (or Fever >/= 101).   aspirin  EC 81 MG tablet Take 81 mg by mouth daily. Swallow whole.   benzonatate  (TESSALON ) 200 MG capsule Take 200 mg by mouth daily as needed (sore throat).   Cholecalciferol (VITAMIN D ) 50 MCG (2000 UT) CAPS Take 1 capsule (2,000 Units total) by mouth daily.   ezetimibe  (ZETIA ) 10 MG tablet Take 1 tablet (10 mg total) by mouth daily.   furosemide  (LASIX ) 40 MG tablet Take 1.5 tablets (60 mg total) every other day, alternating with 1 tablet (40 mg total) on the other days for 1 week, then resume taking 1 tablet daily thereafter.   nitroGLYCERIN  (NITROSTAT ) 0.4 MG SL tablet Place 1 tablet (0.4 mg total) under the tongue every 5 (five) minutes as needed for chest pain.   pantoprazole  (PROTONIX ) 40 MG tablet Take 40 mg by mouth daily.   polyethylene glycol (MIRALAX  / GLYCOLAX ) 17 g packet Take 17 g by mouth daily as needed for moderate constipation.   potassium chloride  (KLOR-CON ) 10 MEQ tablet Take 10 mEq by mouth daily.   protein supplement shake (PREMIER PROTEIN) LIQD Take 11 oz by mouth daily.   rosuvastatin  (CRESTOR ) 40 MG tablet Take 1 tablet (40 mg total) by mouth daily.   traZODone (DESYREL) 50 MG tablet Take 50 mg by mouth at bedtime.   "

## 2024-12-01 ENCOUNTER — Ambulatory Visit: Admitting: Neurology

## 2024-12-18 ENCOUNTER — Ambulatory Visit: Admitting: Internal Medicine

## 2024-12-24 ENCOUNTER — Encounter: Admitting: Surgery

## 2025-01-09 ENCOUNTER — Ambulatory Visit: Admitting: Podiatry

## 2025-04-06 ENCOUNTER — Ambulatory Visit: Admitting: Neurology
